# Patient Record
Sex: Male | Born: 1965 | Race: Black or African American | Hispanic: No | Marital: Single | Smoking: Former smoker
Health system: Southern US, Community
[De-identification: ages and names within clinical notes are randomized; demographics above are authoritative.]

## PROBLEM LIST (undated history)

## (undated) DIAGNOSIS — R55 Syncope and collapse: Secondary | ICD-10-CM

## (undated) DIAGNOSIS — E119 Type 2 diabetes mellitus without complications: Secondary | ICD-10-CM

## (undated) DIAGNOSIS — N200 Calculus of kidney: Secondary | ICD-10-CM

## (undated) DIAGNOSIS — Z95810 Presence of automatic (implantable) cardiac defibrillator: Secondary | ICD-10-CM

## (undated) DIAGNOSIS — I428 Other cardiomyopathies: Secondary | ICD-10-CM

## (undated) DIAGNOSIS — I5022 Chronic systolic (congestive) heart failure: Secondary | ICD-10-CM

## (undated) DIAGNOSIS — I499 Cardiac arrhythmia, unspecified: Secondary | ICD-10-CM

## (undated) DIAGNOSIS — R011 Cardiac murmur, unspecified: Secondary | ICD-10-CM

## (undated) DIAGNOSIS — Z87442 Personal history of urinary calculi: Secondary | ICD-10-CM

## (undated) DIAGNOSIS — I10 Essential (primary) hypertension: Secondary | ICD-10-CM

## (undated) DIAGNOSIS — E669 Obesity, unspecified: Secondary | ICD-10-CM

## (undated) DIAGNOSIS — K219 Gastro-esophageal reflux disease without esophagitis: Secondary | ICD-10-CM

## (undated) DIAGNOSIS — J42 Unspecified chronic bronchitis: Secondary | ICD-10-CM

## (undated) DIAGNOSIS — S42302A Unspecified fracture of shaft of humerus, left arm, initial encounter for closed fracture: Secondary | ICD-10-CM

## (undated) DIAGNOSIS — E236 Other disorders of pituitary gland: Secondary | ICD-10-CM

## (undated) HISTORY — DX: Syncope and collapse: R55

## (undated) HISTORY — DX: Chronic systolic (congestive) heart failure: I50.22

## (undated) HISTORY — DX: Personal history of urinary calculi: Z87.442

## (undated) HISTORY — DX: Unspecified fracture of shaft of humerus, left arm, initial encounter for closed fracture: S42.302A

## (undated) HISTORY — DX: Gastro-esophageal reflux disease without esophagitis: K21.9

## (undated) HISTORY — DX: Other cardiomyopathies: I42.8

## (undated) HISTORY — DX: Other disorders of pituitary gland: E23.6

## (undated) HISTORY — PX: CHOLECYSTECTOMY: SHX55

## (undated) HISTORY — PX: URETEROSCOPY: SHX842

## (undated) HISTORY — DX: Cardiac arrhythmia, unspecified: I49.9

## (undated) HISTORY — DX: Obesity, unspecified: E66.9

---
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
## 1998-06-18 ENCOUNTER — Emergency Department (HOSPITAL_COMMUNITY): Admission: EM | Admit: 1998-06-18 | Discharge: 1998-06-18 | Payer: Self-pay | Admitting: Internal Medicine

## 1998-06-18 ENCOUNTER — Encounter: Payer: Self-pay | Admitting: Internal Medicine

## 1998-08-02 ENCOUNTER — Ambulatory Visit: Admission: RE | Admit: 1998-08-02 | Discharge: 1998-08-02 | Payer: Self-pay | Admitting: Orthopedic Surgery

## 1999-10-23 ENCOUNTER — Emergency Department (HOSPITAL_COMMUNITY): Admission: EM | Admit: 1999-10-23 | Discharge: 1999-10-23 | Payer: Self-pay

## 1999-10-23 ENCOUNTER — Encounter: Payer: Self-pay | Admitting: Emergency Medicine

## 2001-03-09 ENCOUNTER — Encounter: Payer: Self-pay | Admitting: Emergency Medicine

## 2001-03-09 ENCOUNTER — Emergency Department (HOSPITAL_COMMUNITY): Admission: EM | Admit: 2001-03-09 | Discharge: 2001-03-09 | Payer: Self-pay | Admitting: Emergency Medicine

## 2002-04-07 ENCOUNTER — Emergency Department (HOSPITAL_COMMUNITY): Admission: EM | Admit: 2002-04-07 | Discharge: 2002-04-07 | Payer: Self-pay | Admitting: Emergency Medicine

## 2003-02-01 ENCOUNTER — Emergency Department (HOSPITAL_COMMUNITY): Admission: EM | Admit: 2003-02-01 | Discharge: 2003-02-01 | Payer: Self-pay | Admitting: Emergency Medicine

## 2003-02-01 ENCOUNTER — Encounter: Payer: Self-pay | Admitting: Emergency Medicine

## 2003-07-05 ENCOUNTER — Emergency Department (HOSPITAL_COMMUNITY): Admission: EM | Admit: 2003-07-05 | Discharge: 2003-07-05 | Payer: Self-pay | Admitting: Emergency Medicine

## 2003-10-03 ENCOUNTER — Emergency Department (HOSPITAL_COMMUNITY): Admission: EM | Admit: 2003-10-03 | Discharge: 2003-10-03 | Payer: Self-pay | Admitting: Emergency Medicine

## 2004-05-08 ENCOUNTER — Emergency Department (HOSPITAL_COMMUNITY): Admission: EM | Admit: 2004-05-08 | Discharge: 2004-05-08 | Payer: Self-pay | Admitting: Emergency Medicine

## 2004-06-09 DIAGNOSIS — E119 Type 2 diabetes mellitus without complications: Secondary | ICD-10-CM

## 2004-06-09 HISTORY — DX: Type 2 diabetes mellitus without complications: E11.9

## 2004-07-02 ENCOUNTER — Encounter (INDEPENDENT_AMBULATORY_CARE_PROVIDER_SITE_OTHER): Payer: Self-pay | Admitting: *Deleted

## 2004-07-02 ENCOUNTER — Inpatient Hospital Stay (HOSPITAL_COMMUNITY): Admission: EM | Admit: 2004-07-02 | Discharge: 2004-07-03 | Payer: Self-pay | Admitting: Emergency Medicine

## 2004-08-08 ENCOUNTER — Ambulatory Visit (HOSPITAL_COMMUNITY): Admission: RE | Admit: 2004-08-08 | Discharge: 2004-08-08 | Payer: Self-pay | Admitting: General Surgery

## 2004-12-05 ENCOUNTER — Ambulatory Visit: Payer: Self-pay | Admitting: Pulmonary Disease

## 2005-02-14 ENCOUNTER — Ambulatory Visit: Payer: Self-pay | Admitting: Pulmonary Disease

## 2005-06-05 ENCOUNTER — Ambulatory Visit: Payer: Self-pay | Admitting: Pulmonary Disease

## 2005-08-23 ENCOUNTER — Emergency Department (HOSPITAL_COMMUNITY): Admission: EM | Admit: 2005-08-23 | Discharge: 2005-08-23 | Payer: Self-pay | Admitting: Emergency Medicine

## 2005-10-09 ENCOUNTER — Emergency Department (HOSPITAL_COMMUNITY): Admission: EM | Admit: 2005-10-09 | Discharge: 2005-10-09 | Payer: Self-pay | Admitting: Emergency Medicine

## 2005-12-26 ENCOUNTER — Ambulatory Visit: Payer: Self-pay | Admitting: Pulmonary Disease

## 2006-03-30 ENCOUNTER — Ambulatory Visit: Payer: Self-pay | Admitting: Pulmonary Disease

## 2006-04-02 ENCOUNTER — Ambulatory Visit: Payer: Self-pay | Admitting: Pulmonary Disease

## 2006-04-17 ENCOUNTER — Ambulatory Visit: Payer: Self-pay | Admitting: Pulmonary Disease

## 2006-07-09 ENCOUNTER — Ambulatory Visit: Payer: Self-pay | Admitting: Pulmonary Disease

## 2006-07-09 LAB — CONVERTED CEMR LAB
BUN: 10 mg/dL (ref 6–23)
CO2: 25 meq/L (ref 19–32)
Calcium: 9.3 mg/dL (ref 8.4–10.5)
Chloride: 105 meq/L (ref 96–112)
Creatinine, Ser: 0.9 mg/dL (ref 0.4–1.5)
GFR calc Af Amer: 120 mL/min
GFR calc non Af Amer: 99 mL/min
Glucose, Bld: 89 mg/dL (ref 70–99)
Hgb A1c MFr Bld: 6.5 % — ABNORMAL HIGH (ref 4.6–6.0)
Potassium: 3.2 meq/L — ABNORMAL LOW (ref 3.5–5.1)
Sodium: 140 meq/L (ref 135–145)

## 2008-02-28 ENCOUNTER — Ambulatory Visit: Payer: Self-pay | Admitting: Pulmonary Disease

## 2008-02-28 DIAGNOSIS — R0789 Other chest pain: Secondary | ICD-10-CM | POA: Insufficient documentation

## 2008-02-28 DIAGNOSIS — K219 Gastro-esophageal reflux disease without esophagitis: Secondary | ICD-10-CM

## 2008-02-28 DIAGNOSIS — F411 Generalized anxiety disorder: Secondary | ICD-10-CM | POA: Insufficient documentation

## 2008-02-28 DIAGNOSIS — J209 Acute bronchitis, unspecified: Secondary | ICD-10-CM

## 2008-02-28 DIAGNOSIS — K589 Irritable bowel syndrome without diarrhea: Secondary | ICD-10-CM | POA: Insufficient documentation

## 2008-08-06 ENCOUNTER — Emergency Department (HOSPITAL_COMMUNITY): Admission: EM | Admit: 2008-08-06 | Discharge: 2008-08-06 | Payer: Self-pay | Admitting: Emergency Medicine

## 2008-11-07 ENCOUNTER — Emergency Department (HOSPITAL_COMMUNITY): Admission: EM | Admit: 2008-11-07 | Discharge: 2008-11-07 | Payer: Self-pay | Admitting: Emergency Medicine

## 2009-02-24 ENCOUNTER — Emergency Department (HOSPITAL_COMMUNITY): Admission: EM | Admit: 2009-02-24 | Discharge: 2009-02-24 | Payer: Self-pay | Admitting: Emergency Medicine

## 2009-05-08 ENCOUNTER — Ambulatory Visit: Payer: Self-pay | Admitting: Pulmonary Disease

## 2009-05-08 DIAGNOSIS — E1165 Type 2 diabetes mellitus with hyperglycemia: Secondary | ICD-10-CM

## 2009-05-08 DIAGNOSIS — R490 Dysphonia: Secondary | ICD-10-CM | POA: Insufficient documentation

## 2009-05-08 DIAGNOSIS — E669 Obesity, unspecified: Secondary | ICD-10-CM

## 2009-05-08 DIAGNOSIS — Z87891 Personal history of nicotine dependence: Secondary | ICD-10-CM | POA: Insufficient documentation

## 2009-05-08 DIAGNOSIS — IMO0002 Reserved for concepts with insufficient information to code with codable children: Secondary | ICD-10-CM | POA: Insufficient documentation

## 2009-05-13 DIAGNOSIS — E236 Other disorders of pituitary gland: Secondary | ICD-10-CM

## 2009-05-13 DIAGNOSIS — N200 Calculus of kidney: Secondary | ICD-10-CM | POA: Insufficient documentation

## 2009-05-13 LAB — CONVERTED CEMR LAB
ALT: 25 units/L (ref 0–53)
AST: 20 units/L (ref 0–37)
Albumin: 4.4 g/dL (ref 3.5–5.2)
Alkaline Phosphatase: 55 units/L (ref 39–117)
BUN: 10 mg/dL (ref 6–23)
Basophils Absolute: 0 10*3/uL (ref 0.0–0.1)
Basophils Relative: 0.7 % (ref 0.0–3.0)
Bilirubin, Direct: 0.1 mg/dL (ref 0.0–0.3)
CO2: 28 meq/L (ref 19–32)
Calcium: 9.1 mg/dL (ref 8.4–10.5)
Chloride: 108 meq/L (ref 96–112)
Cholesterol: 147 mg/dL (ref 0–200)
Creatinine, Ser: 0.8 mg/dL (ref 0.4–1.5)
Eosinophils Absolute: 0.2 10*3/uL (ref 0.0–0.7)
Eosinophils Relative: 3 % (ref 0.0–5.0)
GFR calc non Af Amer: 134.71 mL/min (ref >60)
Glucose, Bld: 142 mg/dL — ABNORMAL HIGH (ref 70–99)
HCT: 44.7 % (ref 39.0–52.0)
HDL: 35.5 mg/dL — ABNORMAL LOW (ref >39.00)
Hemoglobin: 15.3 g/dL (ref 13.0–17.0)
Hgb A1c MFr Bld: 7.5 % — ABNORMAL HIGH (ref 4.6–6.5)
LDL Cholesterol: 85 mg/dL (ref 0–99)
Lymphocytes Relative: 35.4 % (ref 12.0–46.0)
Lymphs Abs: 2.2 10*3/uL (ref 0.7–4.0)
MCHC: 34.3 g/dL (ref 30.0–36.0)
MCV: 93.6 fL (ref 78.0–100.0)
Monocytes Absolute: 0.5 10*3/uL (ref 0.1–1.0)
Monocytes Relative: 8 % (ref 3.0–12.0)
Neutro Abs: 3.2 10*3/uL (ref 1.4–7.7)
Neutrophils Relative %: 52.9 % (ref 43.0–77.0)
Platelets: 230 10*3/uL (ref 150.0–400.0)
Potassium: 3.9 meq/L (ref 3.5–5.1)
RBC: 4.78 M/uL (ref 4.22–5.81)
RDW: 13.1 % (ref 11.5–14.6)
Sodium: 143 meq/L (ref 135–145)
TSH: 0.68 microintl units/mL (ref 0.35–5.50)
Total Bilirubin: 0.8 mg/dL (ref 0.3–1.2)
Total CHOL/HDL Ratio: 4
Total Protein: 7.6 g/dL (ref 6.0–8.3)
Triglycerides: 135 mg/dL (ref 0.0–149.0)
VLDL: 27 mg/dL (ref 0.0–40.0)
WBC: 6.1 10*3/uL (ref 4.5–10.5)

## 2009-11-07 ENCOUNTER — Telehealth: Payer: Self-pay | Admitting: Pulmonary Disease

## 2010-07-11 NOTE — Progress Notes (Signed)
Summary: nos appt  Phone Note Call from Patient   Caller: [email or web address]  Call For: nadel Summary of Call: Pt will call to rsc nos from 5/31. Initial call taken by: Netta Neat,  November 07, 2009 2:22 PM

## 2010-08-22 ENCOUNTER — Other Ambulatory Visit: Payer: Self-pay

## 2010-08-22 ENCOUNTER — Other Ambulatory Visit: Payer: Self-pay | Admitting: Adult Health

## 2010-08-22 ENCOUNTER — Ambulatory Visit (INDEPENDENT_AMBULATORY_CARE_PROVIDER_SITE_OTHER): Payer: Self-pay | Admitting: Adult Health

## 2010-08-22 ENCOUNTER — Encounter: Payer: Self-pay | Admitting: Adult Health

## 2010-08-22 DIAGNOSIS — J209 Acute bronchitis, unspecified: Secondary | ICD-10-CM

## 2010-08-22 DIAGNOSIS — R7309 Other abnormal glucose: Secondary | ICD-10-CM

## 2010-08-22 LAB — BASIC METABOLIC PANEL
BUN: 8 mg/dL (ref 6–23)
Chloride: 105 mEq/L (ref 96–112)
Creatinine, Ser: 0.8 mg/dL (ref 0.4–1.5)
GFR: 139.98 mL/min (ref >60.00)
Glucose, Bld: 189 mg/dL — ABNORMAL HIGH (ref 70–99)
Potassium: 4.1 mEq/L (ref 3.5–5.1)
Sodium: 139 mEq/L (ref 135–145)

## 2010-08-22 LAB — HEMOGLOBIN A1C: Hgb A1c MFr Bld: 9.7 % — ABNORMAL HIGH (ref 4.6–6.5)

## 2010-09-05 NOTE — Assessment & Plan Note (Signed)
Summary: Acute NP office visit - asthmatic bronchitis   CC:  Pt c/o nasal congestion, headache, teeth hurt, facial pressure, tired, and sneezing x 3 days. Tried Tylenol cold w/o relief. Sore throat today. Pt also request refill on Metformin and Dexilant.  History of Present Illness:   The patient is a 45 year old African-American male, patient of Dr. Jeannine Kitten who continues to smoke against medical advice. Has hx of GERD and IBS.     August 22, 2010 --Presents for an acute office visit. Complains of  nasal congestion, headache, teeth hurt, facial pressure, tired, sneezing x 3 days. Tried Tylenol cold w/o relief. Sore throat today. Pt also request refill on Metformin and Dexilant. He was last seen 04/2009. Overall says he has been doing weel. He is still smoking, not ready to quit. He does not have insurance. Denies chest pain, dyspnea, orthopnea, hemoptysis, fever, n/v/d, edema, headache, polyuria, polydipsia.    Preventive Screening-Counseling & Management  Alcohol-Tobacco     Smoking Status: current     Packs/Day: 1ppd  Medications Prior to Update: 1)  Metformin Hcl 500 Mg Tabs (Metformin Hcl) .... Take 1 Tab By Mouth Two Times A Day At [place]... 2)  Dexilant 60 Mg Cpdr (Dexlansoprazole) .... Take 1 Tab By Mouth Once Daily- 30 Min Before The Evening Meal...  Current Medications (verified): 1)  Metformin Hcl 500 Mg Tabs (Metformin Hcl) .... Take 1 Tab By Mouth Two Times A Day At [place]... 2)  Dexilant 60 Mg Cpdr (Dexlansoprazole) .... Take 1 Tab By Mouth Once Daily- 30 Min Before The Evening Meal...  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 Mother alive, age 60, hx DM, arthritis 3 Siblings- 1 Bro w/ HBP  Social History: Last updated: 05/08/2009 Single No children +smoker- 1ppd  Social alcohol No drugs Not exercising  Risk Factors: Smoking Status: current (08/22/2010) Packs/Day: 1ppd (08/22/2010)  Review of Systems      See HPI  Vital Signs:  Patient profile:   45 year old male Height:      73 inches Weight:      272.8 pounds BMI:     36.12 O2 Sat:      95 % on Room air Temp:     97.3 degrees F oral Pulse rate:   91 / minute BP sitting:   108 / 70  (left arm) Cuff size:   large  Vitals Entered By: Iran Planas CMA (August 22, 2010 12:00 PM)  O2 Flow:  Room air CC: Pt c/o nasal congestion, headache, teeth hurt, facial pressure, tired, sneezing x 3 days. Tried Tylenol cold w/o relief. Sore throat today. Pt also request refill on Metformin and Dexilant Is Patient Diabetic? Yes Comments Medications reviewed with patient Verified contact number and pharmacy with patient Iran Planas CMA  August 22, 2010 12:03 PM    Physical Exam  Additional Exam:  WD, Overweight, 44y/o BM in NAD... GENERAL:  Alert & oriented; pleasant & cooperative... HEENT:  [place]/AT, EOM-wnl,  , EACs-clear, TMs-wnl, NOSE-clear drainge, max sinus tenderness ,  THROAT-clear & wnl. NECK:  Supple w/ full ROM; no JVD; normal carotid impulses w/o bruits; no thyromegaly or nodules palpated; no lymphadenopathy. CHEST:  Clear to P & A; without wheezes/ rales/ or rhonchi. HEART:  Regular Rhythm; without murmurs/ rubs/ or gallops. ABDOMEN:  Soft & nontender; normal bowel sounds; no organomegaly or masses detected. EXT: without deformities or  arthritic changes; no varicose veins/ venous insuffic/ or edema.     Impression & Recommendations:  Problem # 1:  Hx of ASTHMATIC BRONCHITIS, ACUTE (ICD-466.0) Flare :  Amoxicillin 500mg  three times a day for 10 days  Mucinex two times a day as needed congestion Saline nasal rinses as needed  Afrin 2 puffs two times a day x 3 days  Nasonex 2 puffs two times a day until sample is gone.  Please contact office for sooner follow up if symptoms do not improve or worsen  Orders: Admin of Therapeutic Inj  intramuscular or subcutaneous [id number]) Depo- Medrol 80mg  (J1040) Depo- Medrol 40mg  (J1030) Est. Patient Level II [id number])  His updated medication list for this problem includes:    Amoxicillin 500 Mg Caps (Amoxicillin) .Marland Kitchen... Take 1 capsule by mouth three times a day x10days  Problem # 2:  DIABETES MELLITUS, BORDERLINE (ICD-790.29) dfiet discussed  labs pending no change in rx , follow up labs.  Orders: TLB-BMP (Basic Metabolic Panel-BMET) (99991111) TLB-A1C / Hgb A1C (Glycohemoglobin) (83036-A1C) Est. Patient Level II [id number])  Medications Added to Medication List This Visit: 1)  Amoxicillin 500 Mg Caps (Amoxicillin) .... Take 1 capsule by mouth three times a day x10days  Patient Instructions: 1)  Amoxicillin 500mg  three times a day for 10 days  2)  Mucinex two times a day as needed congestion 3)  Saline nasal rinses as needed  4)  Afrin 2 puffs two times a day x 3 days  5)  Nasonex 2 puffs two times a day until sample is gone.  6)  Please contact office for sooner follow up if symptoms do not improve or worsen  Prescriptions: AMOXICILLIN 500 MG CAPS (AMOXICILLIN) Take 1 capsule by mouth three times a day x10days  #30 x 0   Entered by:   Parke Poisson CNA/MA   Authorized by:   Rexene Edison NP   Signed by:   Parke Poisson CNA/MA on 08/22/2010   Method used:   Electronically to        [facility] [phone]* (retail)       [place], [place]  [id number]       Ph: [phone] or [phone]       Fax: [phone]   RxID:   [id number] DEXILANT 60 MG CPDR (DEXLANSOPRAZOLE) take 1 tab by mouth once daily- 30 min before the evening meal...  #30 x 5   Entered and Authorized by:   Rexene Edison NP   Signed by:   Rexene Edison NP on 08/22/2010   Method used:   Electronically to        [facility] [phone]* (retail)       [place], [place]  [id number]       Ph: [phone] or [phone]       Fax: [phone]   RxID:   [id number] METFORMIN HCL 500 MG TABS (METFORMIN HCL) take 1 tab  by mouth two times a day at breakfast & dinner...  #60 x 5   Entered and Authorized by:   Rexene Edison NP   Signed by:   Hazelene Doten NP on 08/22/2010   Method used:   Electronically to        [facility] [phone]* (retail)       [place], [place]  [id number]       Ph: [phone] or [phone]       Fax: [phone]   RxID:   [id number]    Medication Administration  Injection # 1:    Medication: Depo- Medrol 40mg     Diagnosis: Hx of ASTHMATIC BRONCHITIS, ACUTE (ICD-466.0)    Route: IM    Site: RUOQ gluteus    Exp Date: 12-2012    Lot #: [id number]    Mfr: Pharmacia    Patient tolerated injection without complications    Given by: Parke Poisson CNA/MA (August 22, 2010 12:32 PM)  Injection # 2:    Medication: Depo- Medrol 80mg     Diagnosis: Hx of ASTHMATIC BRONCHITIS, ACUTE (ICD-466.0)    Route: IM    Site: RUOQ gluteus    Exp Date: 12-2012    Lot #: [id number]    Mfr: Pharmacia    Patient tolerated injection without complications    Given by: Parke Poisson CNA/MA (August 22, 2010 12:32 PM)  Orders Added: 1)  Admin of Therapeutic Inj  intramuscular or subcutaneous [96372] 2)  Depo- Medrol 80mg  [J1040] 3)  Depo- Medrol 40mg  [J1030] 4)  TLB-BMP (Basic Metabolic Panel-BMET) [id number] 5)   TLB-A1C / Hgb A1C (Glycohemoglobin) [83036-A1C] 6)  Est. Patient Level II [id number]

## 2010-09-13 LAB — GLUCOSE, CAPILLARY
Glucose-Capillary: 106 mg/dL — ABNORMAL HIGH (ref 70–99)
Glucose-Capillary: 108 mg/dL — ABNORMAL HIGH (ref 70–99)

## 2010-09-16 LAB — URINALYSIS, ROUTINE W REFLEX MICROSCOPIC
Bilirubin Urine: NEGATIVE
Glucose, UA: NEGATIVE mg/dL
Hgb urine dipstick: NEGATIVE
Nitrite: NEGATIVE
Protein, ur: NEGATIVE mg/dL
Specific Gravity, Urine: 1.022 (ref 1.005–1.030)
Urobilinogen, UA: 1 mg/dL (ref 0.0–1.0)
pH: 6 (ref 5.0–8.0)

## 2010-09-16 LAB — POCT I-STAT, CHEM 8
BUN: 10 mg/dL (ref 6–23)
Calcium, Ion: 1.15 mmol/L (ref 1.12–1.32)
Chloride: 105 mEq/L (ref 96–112)
Creatinine, Ser: 0.8 mg/dL (ref 0.4–1.5)
Glucose, Bld: 131 mg/dL — ABNORMAL HIGH (ref 70–99)
HCT: 45 % (ref 39.0–52.0)
Hemoglobin: 15.3 g/dL (ref 13.0–17.0)
Potassium: 3.7 mEq/L (ref 3.5–5.1)
Sodium: 140 mEq/L (ref 135–145)
TCO2: 26 mmol/L (ref 0–100)

## 2010-09-16 LAB — CBC
HCT: 43.3 % (ref 39.0–52.0)
Hemoglobin: 14.7 g/dL (ref 13.0–17.0)
MCHC: 34 g/dL (ref 30.0–36.0)
MCV: 92.2 fL (ref 78.0–100.0)
Platelets: 225 10*3/uL (ref 150–400)
RBC: 4.69 MIL/uL (ref 4.22–5.81)
RDW: 14.2 % (ref 11.5–15.5)
WBC: 7 10*3/uL (ref 4.0–10.5)

## 2010-09-16 LAB — DIFFERENTIAL
Basophils Absolute: 0 10*3/uL (ref 0.0–0.1)
Basophils Relative: 1 % (ref 0–1)
Eosinophils Absolute: 0.3 10*3/uL (ref 0.0–0.7)
Eosinophils Relative: 4 % (ref 0–5)
Lymphocytes Relative: 36 % (ref 12–46)
Lymphs Abs: 2.5 10*3/uL (ref 0.7–4.0)
Monocytes Absolute: 0.6 10*3/uL (ref 0.1–1.0)
Monocytes Relative: 8 % (ref 3–12)
Neutro Abs: 3.6 10*3/uL (ref 1.7–7.7)
Neutrophils Relative %: 52 % (ref 43–77)

## 2010-09-16 LAB — POCT CARDIAC MARKERS
CKMB, poc: <1 ng/mL — ABNORMAL LOW (ref 1.0–8.0)
CKMB, poc: <1 ng/mL — ABNORMAL LOW (ref 1.0–8.0)
Myoglobin, poc: 71.8 ng/mL (ref 12–200)
Troponin i, poc: <0.05 ng/mL (ref 0.00–0.09)

## 2010-09-16 LAB — GLUCOSE, CAPILLARY: Glucose-Capillary: 130 mg/dL — ABNORMAL HIGH (ref 70–99)

## 2010-09-24 LAB — POCT I-STAT, CHEM 8
BUN: 10 mg/dL (ref 6–23)
Creatinine, Ser: 0.9 mg/dL (ref 0.4–1.5)
Potassium: 3.6 mEq/L (ref 3.5–5.1)
Sodium: 142 mEq/L (ref 135–145)

## 2010-09-24 LAB — URINALYSIS, ROUTINE W REFLEX MICROSCOPIC
Bilirubin Urine: NEGATIVE
Hgb urine dipstick: NEGATIVE
Ketones, ur: NEGATIVE mg/dL
Specific Gravity, Urine: 1.028 (ref 1.005–1.030)
Urobilinogen, UA: 1 mg/dL (ref 0.0–1.0)

## 2010-10-25 NOTE — Op Note (Signed)
[name], [name]                ACCOUNT NO.:  [id number]   MEDICAL RECORD NO.:  [id number]          PATIENT TYPE:  INP   LOCATION:  [id number]                         FACILITY:  [facility]   PHYSICIAN:  Orson Ape. Weatherly, M.D.DATE OF BIRTH:  [birth year]   DATE OF PROCEDURE:  07/02/2004  DATE OF DISCHARGE:                                 OPERATIVE REPORT   PREOPERATIVE DIAGNOSIS:  Acute cholecystitis with stones.   POSTOPERATIVE DIAGNOSIS:  Acute cholecystitis with stones.   OPERATION/PROCEDURE:  Laparoscopic cholecystectomy with cholangiogram.   ANESTHESIA:  General.   SURGEON:  Orson Ape. Rise Patience, M.D.   ASSISTANT:  Shellia Carwin, M.D.   HISTORY:  [name] is a 45 year old, about 235-pound male who has had  three significant episodes of severe epigastric, right upper quadrant pain  and after the third episode, this has been occurring at about 10-day  intervals, presented to the emergency room at [facility] where a CT scan  showed a thickened gallbladder and normal common bile duct.  He underwent an  ultrasound then that showed thickened, acutely inflamed gallbladder.  His  white count was only 7600 and he was admitted by Dr. Truitt Leep about  midnight last night.  Today because of scheduling conflicts, she asked that  I manage him and he has been waiting all day, and finally able to get to the  OR about 7:30.   DESCRIPTION OF PROCEDURE:  Preoperatively the patient has been given Unasyn.  He has PAS stockings and is positioned on the OR table.  Induction of  general anesthesia, endotracheal tube, oral tube into the stomach.  The area  around the navel, the subxiphoid and the right lateral area were shaved and  then prepped with Betadine surgical solution and draped in a sterile manner.  A small incision below the umbilicus was made through two inches of adipose  tissue.  The fascia picked up between two Kochers carefully and then a small  opening carefully made into the  peritoneal cavity.  A pursestring suture was  made under direct vision and the Hasson cannula introduced.  The upper 10 mm  trocar was placed under direct vision after anesthetizing the fascia with  Marcaine and  the two lateral 5 mm trocars were positioned by Dr. Magdalene River.  The gallbladder was markedly inflamed and dilated and we could grasp  and kind of flip the liver upwards.  Fortunately he did not have any  adhesions in the right upper quadrant except for a few right around the  gallbladder area.  He was positioned so he turned somewhat to the left and  then using the hook electrodissector, kind of opened up the peritoneum at  the most proximal portion of the gallbladder, identifying the cystic duct.  This was encompassed and then clipped flush with an endoclip, a little  opening made proximally in the cystic duct. Cook catheter introduced and  held in place with clips and x-ray was obtained which showed good flow into  the extrahepatic biliary tree and the intrahepatic radicals also visualized.  No evidence of any common duct  stones.  The catheter was removed.  The  cystic duct was triply clipped and then divided.  Next, the cystic artery  was identified, doubly clipped proximally, singly distally and then  additional clip placed on the real free cystic artery and then it was  clipped distal to the three clips and then the __________  used to free up  the gallbladder carefully.  Good hemostasis was obtained.  We then placed  the gallbladder within an EndoCatch bag.  We switched the camera to the  upper 10 mm port and withdrew the opening of the bag through the umbilicus.  I could tease up the proximal portion of the gallbladder and then opened it  and we crushed a large amount of bilirubin and cholesterol stones and then  were able to kind of bring it through the fascial defect by enlarging it.  The reinspection revealed good hemostasis.  The umbilical fascia was closed  with  figure-of-eight in addition to the pursestring.  Both tied with 0  Vicryl and then the fascia  anesthetized.  The little irrigating fluid that  had been used was aspirated and good hemostasis and then the carbon dioxide  released.  The 5 mm ports were removed under direct vision and then the  carbon dioxide released.  The upper 10 mm trocar was withdrawn and the  subcutaneous wounds were closed with 4-0 Vicryl and Benzoin and Steri-Strips  were placed on the skin.  The patient tolerated the procedure nicely and was  extubated and taken to the recovery room in stable postoperative condition.   Hopefully, he will be ready for discharge in the morning. I will give him  one additional dose of Unasyn.      WJW/MEDQ  D:  07/02/2004  T:  07/03/2004  Job:  [id number]

## 2010-10-25 NOTE — H&P (Signed)
NAMEMERIL, PELINO                ACCOUNT NO.:  [id number]   MEDICAL RECORD NO.:  [id number]          PATIENT TYPE:  INP   LOCATION:  [id number]                         FACILITY:  [facility]   PHYSICIAN:  Darrelyn Hillock, MDDATE OF BIRTH:  [birth year]   DATE OF ADMISSION:  07/02/2004  DATE OF DISCHARGE:                                HISTORY & PHYSICAL   ADMISSION DIAGNOSIS:  Acute cholecystitis.   CONDITION ON ADMISSION:  Serious.   ADMITTING PHYSICIAN:  Janeece Agee, M.D.   HOPI:  The patient is a very pleasant 45 year old black male who over the  last four months has been having episodes at least every 10 days of right  upper quadrant and epigastric abdominal pain.  Last night it lasted more  than 24 hours and he presented to the [facility] Room.  Workup  there showed a CT with a thickened gallbladder wall, normal common bile  ducts.  The patient then underwent ultrasound which verified the thick  gallbladder wall and the evidence of cholelithiasis.  Patient had normal  common bile duct diameter.  Liver function tests are all within normal  range, his white count is 7.6 thousand.   PAST MEDICAL HISTORY:  None.   PAST SURGICAL HISTORY:  None.   MEDICATIONS:  None.   ALLERGIES:  He has NO KNOWN DRUG ALLERGIES.   PHYSICAL EXAMINATION:  He is an appropriate black male in no distress.  He  temperature is 97, heart rate is 70, respiratory rate is 18, blood pressure  is 135/86.  HEENT EXAM:  Benign, normocephalic, atraumatic.  Pupils are equal, round,  reactive to light.  NECK:  Supple and soft without thyromegaly or cervical adenopathy.  LUNGS:  Clear to auscultation and percussion x2.  HEART:  Regular rate and rhythm without murmurs, rubs or gallops.  ABDOMEN:  Soft, moderately tender in the right upper quadrant.  To  palpation, there was no evidence of abdominal wall hernia defect.  EXTREMITY EXAM:  Shows no clubbing, cyanosis or edema.   IMPRESSION:  Acute  cholecystitis.   PLAN:  Admission, IV antibiotics and laparoscopic cholecystectomy.      KRH/MEDQ  D:  07/02/2004  T:  07/02/2004  Job:  [id number]

## 2010-10-25 NOTE — Assessment & Plan Note (Signed)
[place]                               PULMONARY OFFICE NOTE   [name]                       MRN:          [id number]  DATE:03/30/2006                            DOB:          1965/06/30    HISTORY OF PRESENT ILLNESS:  The patient is a 45 year old African-American  male, patient of [name] who continues to smoke against medical advice.  The patient presents complaining that he has been extremely fatigued over  the last several months and has noted some polyuria and polydipsia.  The  patient denies any chest pain, nausea, vomiting, leg swelling, rash or  diarrhea.  The patient also has had persistent hoarseness over the last 4  months and reports he had an [name] evaluation 3 weeks ago, which sounds like  he did in fact have a biopsy.  The reports were unremarkable.  Those records  are unavailable and the patient does not remember the name of the [name] he was  seen by.  We did not make this referral.   PAST MEDICAL HISTORY:  1. Gastroesophageal reflux.  2. Asthmatic bronchitis.  3. Status post cholecystectomy.  4. Irritable bowel syndrome.   CURRENT MEDICATIONS:  Nexium 40 mg daily.   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  He smokes approximately 1 pack per day and drinks  alcohol on a social basis.   PHYSICAL EXAMINATION:  GENERAL:  The patient is a morbidly obese male in no  acute distress.  VITAL SIGNS:  He is afebrile.  Blood pressure is 150/90.  O2 saturation is  96% on room air.  Weight is at 286.  HEENT:  Nasal mucosa is erythematous.  Posterior pharynx has some erythema  and no exudate noted.  TMs are normal.  NECK:  Supple without cervical adenopathy.  No JVD.  LUNGS:  Lung sounds are clear.  CARDIAC:  S1 and S2 without murmur, rub or gallop.  ABDOMEN:  Morbidly obese and soft.  No hepatosplenomegaly.  EXTREMITIES:  Warm without any calf tenderness,  cyanosis, clubbing or edema.   IMPRESSION AND PLAN:  1. Acute rhinitis and persistent hoarseness.  A strep test and mono test      are currently pending.  Will add in Nasacort AQ 2 puffs twice daily      along with Claritin daily.  The patient will check here in 4 weeks with      Dr. Lenna Gilford or sooner if needed.  2. Polyuria, polydipsia, weight gain and fatigue.  Will need labs done      today.  Patient does have a family history of diabetes mellitus.  Will      check hemoglobin A1c and CMET.  Patient      is advised on dietary measures and exercise.  Will follow up on labs      accordingly.  3. Gastroesophageal reflux.  Will continue on Nexium.      ______________________________  Rexene Edison, NP    ______________________________  Deborra Medina. Lenna Gilford, MD  TP/MedQ  DD:  03/30/2006  DT:  03/31/2006  Job #:  [id number]

## 2010-12-25 ENCOUNTER — Inpatient Hospital Stay (INDEPENDENT_AMBULATORY_CARE_PROVIDER_SITE_OTHER)
Admission: RE | Admit: 2010-12-25 | Discharge: 2010-12-25 | Disposition: A | Discharge status: Home / Self Care | Payer: Self-pay | Source: Ambulatory Visit | Attending: Family Medicine | Admitting: Family Medicine

## 2010-12-25 DIAGNOSIS — N419 Inflammatory disease of prostate, unspecified: Secondary | ICD-10-CM

## 2010-12-25 LAB — POCT URINALYSIS DIP (DEVICE)
Bilirubin Urine: NEGATIVE
Hgb urine dipstick: NEGATIVE
Ketones, ur: 15 mg/dL — AB
pH: 5.5 (ref 5.0–8.0)

## 2011-01-03 ENCOUNTER — Ambulatory Visit (INDEPENDENT_AMBULATORY_CARE_PROVIDER_SITE_OTHER): Payer: Self-pay

## 2011-01-03 ENCOUNTER — Inpatient Hospital Stay (INDEPENDENT_AMBULATORY_CARE_PROVIDER_SITE_OTHER)
Admission: RE | Admit: 2011-01-03 | Discharge: 2011-01-03 | Disposition: A | Discharge status: Home / Self Care | Payer: Self-pay | Source: Ambulatory Visit | Attending: Family Medicine | Admitting: Family Medicine

## 2011-01-03 DIAGNOSIS — J019 Acute sinusitis, unspecified: Secondary | ICD-10-CM

## 2011-01-03 DIAGNOSIS — J40 Bronchitis, not specified as acute or chronic: Secondary | ICD-10-CM

## 2011-09-04 ENCOUNTER — Telehealth: Payer: Self-pay | Admitting: Pulmonary Disease

## 2011-09-04 MED ORDER — AMOXICILLIN-POT CLAVULANATE 875-125 MG PO TABS: 1.0000 | ORAL_TABLET | Freq: Two times a day (BID) | ORAL | Status: AC

## 2011-09-04 NOTE — Telephone Encounter (Signed)
Pt aware of SN recs and was told to keep his appt in May with Sn. Pt verbalized understanding.

## 2011-09-04 NOTE — Telephone Encounter (Signed)
Pt c/o sinus pain and pressure, cough with brownish mucus and thinks he may have had a fever yesterday. Pt last saw SN in 2010 but did she TP in 2012. Pt has been scheduled with SN for 10/10/2011. Pls advise.Allergies not on file Pt wants any prescription to be sent to [facility] on [address] not [facility].

## 2011-09-04 NOTE — Telephone Encounter (Signed)
Per SN---pt will need to keep appt in may---augmentin 875mg   #20  1 po bid until gone,. mucinex otc  2 po bid with plenty of fluids, nasal saline spray every  Hour while awake.  thanks

## 2011-10-09 ENCOUNTER — Encounter: Payer: Self-pay | Admitting: Pulmonary Disease

## 2011-10-10 ENCOUNTER — Ambulatory Visit: Payer: Self-pay | Admitting: Pulmonary Disease

## 2013-06-09 ENCOUNTER — Other Ambulatory Visit: Payer: Self-pay | Admitting: Family Medicine

## 2013-06-20 ENCOUNTER — Emergency Department (HOSPITAL_COMMUNITY)
Admission: EM | Admit: 2013-06-20 | Discharge: 2013-06-20 | Discharge status: Against Medical Advice | Payer: Self-pay | Attending: Emergency Medicine | Admitting: Emergency Medicine

## 2013-06-20 ENCOUNTER — Emergency Department (HOSPITAL_COMMUNITY): Payer: Self-pay

## 2013-06-20 ENCOUNTER — Encounter (HOSPITAL_COMMUNITY): Payer: Self-pay | Admitting: Emergency Medicine

## 2013-06-20 DIAGNOSIS — R110 Nausea: Secondary | ICD-10-CM | POA: Insufficient documentation

## 2013-06-20 DIAGNOSIS — R05 Cough: Secondary | ICD-10-CM | POA: Insufficient documentation

## 2013-06-20 DIAGNOSIS — R1032 Left lower quadrant pain: Secondary | ICD-10-CM | POA: Insufficient documentation

## 2013-06-20 DIAGNOSIS — F17200 Nicotine dependence, unspecified, uncomplicated: Secondary | ICD-10-CM | POA: Insufficient documentation

## 2013-06-20 DIAGNOSIS — E669 Obesity, unspecified: Secondary | ICD-10-CM | POA: Insufficient documentation

## 2013-06-20 DIAGNOSIS — J3489 Other specified disorders of nose and nasal sinuses: Secondary | ICD-10-CM | POA: Insufficient documentation

## 2013-06-20 DIAGNOSIS — Z8719 Personal history of other diseases of the digestive system: Secondary | ICD-10-CM | POA: Insufficient documentation

## 2013-06-20 DIAGNOSIS — M549 Dorsalgia, unspecified: Secondary | ICD-10-CM | POA: Insufficient documentation

## 2013-06-20 DIAGNOSIS — E119 Type 2 diabetes mellitus without complications: Secondary | ICD-10-CM | POA: Insufficient documentation

## 2013-06-20 DIAGNOSIS — Z8659 Personal history of other mental and behavioral disorders: Secondary | ICD-10-CM | POA: Insufficient documentation

## 2013-06-20 DIAGNOSIS — R300 Dysuria: Secondary | ICD-10-CM | POA: Insufficient documentation

## 2013-06-20 DIAGNOSIS — N200 Calculus of kidney: Secondary | ICD-10-CM | POA: Insufficient documentation

## 2013-06-20 DIAGNOSIS — R059 Cough, unspecified: Secondary | ICD-10-CM | POA: Insufficient documentation

## 2013-06-20 DIAGNOSIS — R109 Unspecified abdominal pain: Secondary | ICD-10-CM

## 2013-06-20 LAB — BASIC METABOLIC PANEL
BUN: 12 mg/dL (ref 6–23)
CALCIUM: 8.4 mg/dL (ref 8.4–10.5)
CO2: 24 meq/L (ref 19–32)
Chloride: 105 mEq/L (ref 96–112)
Creatinine, Ser: 0.61 mg/dL (ref 0.50–1.35)
GFR calc Af Amer: >90 mL/min (ref >90)
GLUCOSE: 254 mg/dL — AB (ref 70–99)
POTASSIUM: 4.1 meq/L (ref 3.7–5.3)
SODIUM: 140 meq/L (ref 137–147)

## 2013-06-20 LAB — CBC WITH DIFFERENTIAL/PLATELET
Basophils Absolute: 0 10*3/uL (ref 0.0–0.1)
Basophils Relative: 1 % (ref 0–1)
EOS ABS: 0.1 10*3/uL (ref 0.0–0.7)
EOS PCT: 3 % (ref 0–5)
HCT: 41 % (ref 39.0–52.0)
HEMOGLOBIN: 14.2 g/dL (ref 13.0–17.0)
LYMPHS ABS: 2.2 10*3/uL (ref 0.7–4.0)
LYMPHS PCT: 41 % (ref 12–46)
MCH: 30.8 pg (ref 26.0–34.0)
MCHC: 34.6 g/dL (ref 30.0–36.0)
MCV: 88.9 fL (ref 78.0–100.0)
MONOS PCT: 6 % (ref 3–12)
Monocytes Absolute: 0.3 10*3/uL (ref 0.1–1.0)
Neutro Abs: 2.7 10*3/uL (ref 1.7–7.7)
Neutrophils Relative %: 50 % (ref 43–77)
PLATELETS: 230 10*3/uL (ref 150–400)
RBC: 4.61 MIL/uL (ref 4.22–5.81)
RDW: 13.8 % (ref 11.5–15.5)
WBC: 5.5 10*3/uL (ref 4.0–10.5)

## 2013-06-20 LAB — URINE MICROSCOPIC-ADD ON

## 2013-06-20 LAB — URINALYSIS, ROUTINE W REFLEX MICROSCOPIC
Bilirubin Urine: NEGATIVE
Glucose, UA: >1000 mg/dL — AB
Hgb urine dipstick: NEGATIVE
Ketones, ur: NEGATIVE mg/dL
Leukocytes, UA: NEGATIVE
Nitrite: NEGATIVE
Protein, ur: NEGATIVE mg/dL
Specific Gravity, Urine: 1.028 (ref 1.005–1.030)
Urobilinogen, UA: 1 mg/dL (ref 0.0–1.0)
pH: 5.5 (ref 5.0–8.0)

## 2013-06-20 LAB — LIPASE, BLOOD: Lipase: 40 U/L (ref 11–59)

## 2013-06-20 MED ORDER — OXYCODONE-ACETAMINOPHEN 5-325 MG PO TABS: 1.0000 | ORAL_TABLET | Freq: Three times a day (TID) | ORAL | Status: DC | PRN

## 2013-06-20 MED ORDER — ONDANSETRON HCL 4 MG/2ML IJ SOLN
4.0000 mg | Freq: Once | INTRAMUSCULAR | Status: AC
  Administered 2013-06-20: 4 mg via INTRAVENOUS
  Filled 2013-06-20: qty 2

## 2013-06-20 MED ORDER — OXYCODONE-ACETAMINOPHEN 5-325 MG PO TABS
2.0000 | ORAL_TABLET | Freq: Once | ORAL | Status: DC
  Filled 2013-06-20: qty 2

## 2013-06-20 MED ORDER — KETOROLAC TROMETHAMINE 30 MG/ML IJ SOLN
30.0000 mg | Freq: Once | INTRAMUSCULAR | Status: AC
  Administered 2013-06-20: 30 mg via INTRAVENOUS
  Filled 2013-06-20: qty 1

## 2013-06-20 MED ORDER — ONDANSETRON HCL 4 MG PO TABS: 4.0000 mg | ORAL_TABLET | Freq: Four times a day (QID) | ORAL | Status: DC

## 2013-06-20 NOTE — Discharge Instructions (Signed)
Please call and set-up an appointment with urology regarding flank pain Please take medications as prescribed - while on pain medications there is to be no drinking alcohol, driving, operating heavy machinery. If there is extra please dispose in a proper manner. Please do not take any extra Tylenol for this can lead to liver failure and Tylenol overdose.  Please rest and stay hydrated Please continue to strain urine for passage of stone possibility Please continue to monitor symptoms and if symptoms are to worsen or change (fever, chills, abdominal pain, blood in urine, chest pain, shortness of breath, difficulty breathing, worsening or changes to flank pain, nausea, vomiting) please report back to the ED immediately.     Diet for Kidney Stones Kidney stones are small, hard masses that form inside your kidneys. They are made up of salts and minerals and often form when high levels build up in the urine. The minerals can then start to build up, crystalize, and stick together to form stones. There are several different types of kidney stones. The following types of stones may be influenced by dietary factors:   Calcium Oxalate Stones. An oxalate is a salt found in certain foods. Within the body, calcium can combine with oxalates to form calcium oxalate stones, which can be excreted in the urine in high amounts. This is the most common type of kidney stone.  Calcium Phosphate Stones. These stones may occur when the pH of the urine becomes too high, or less acidic, from too much calcium being excreted in the urine. The pH is a measure of how acidic or basic a substance is.  Uric Acid Stones. This type of stone occurs when the pH of the urine becomes too low, or very acidic, because substances called purines build up in the urine. Purines are found in animal proteins. When the urine is highly concentrated with acid, uric acid kidney stones can form.  Other risk factors for kidney stones include genetics,  environment, and being overweight. Your caregiver may ask you to follow specific diet guidelines based on the type of stone you have to lessen the chances of your body making more kidney stones.  GENERAL GUIDELINES FOR ALL TYPES OF STONES  Drink plenty of fluid. Drink 12 16 cups of fluid a day, drinking mainly water.This is the most important thing you can do to prevent the formation of future kidney stones.  Maintain a healthy weight. Your caregiver or dietitian can help you determine what a healthy weight is for you. If you are overweight, weight loss may help prevent the formation of future kidney stones.  Eat a diet adequate in animal protein. Too much animal protein can contribute to the formation of stones. Your dietitian can help you determine how much protein you should be eating. Avoid low carbohydrate, high protein diets.  Follow a balanced eating approach. The DASH diet, which stands for "Dietary Approaches to Stop Hypertension," is an effective meal plan for reducing stone formation. This diet is high in fruits, vegetables, dairy, and whole grains and low in animal protein. Ask your caregiver or dietitian for information about the DASH diet. ADDITIONAL DIET GUIDELINES FOR CALCIUM STONES Avoid foods high in salt. This includes table salt, salt seasonings, MSG, soy sauce, cured and processed meats, salted crackers and snack foods, fast food, and canned soups and foods. Ask your caregiver or dietitian for information about reducing sodium in your diet or following the low sodium diet.  Ensure adequate calcium intake. Use the following table  for calcium guidelines:  Men 31 years old and younger  1000 mg/day.  Men 82 years old and older  1500 mg/day.  Women 74 49 years old  1000 mg/day.  Women 50 years and older  1500 mg/day. Your dietitian can help you determine if you are getting enough calcium in your diet. Foods that are high in calcium include dairy products, broccoli, cheese,  yogurt, and pudding. If you need to take a calcium supplement, take it only in the form of calcium citrate.  Avoid foods high in oxalate. Be sure that any supplements you take do not contain more than 500 mg of vitamin C. Vitamin C is converted into oxalate in the body. You do not need to avoid fruits and vegetables high in vitamin C.   Grains: High-fiber or bran cereal, whole-wheat bread, grits, barley, buckwheat, amaranth, pretzels, and fruitcake.  Vegetables: Dried beans, wax beans, dark leafy greens, eggplant, leeks, okra, parsley, rutabaga, tomato paste, watercress, zucchini, and escarole.  Fruit: Dried apricots, red currants, figs, kiwi, and rhubarb.  Meat and Meat Substitutes: Soybeans and foods made from soy (soyburger, miso), dried beans, peanut butter.  Milk: Chocolate milk mixes and soymilk.  Fats and Oils: Nuts (peanuts, almonds, pecans, cashews, hazelnuts) and nut butters, sesame seeds, and tDahini paste.  Condiments/Miscellaneous: Chocolate, carob, marmalade, poppy seeds, instant iced tea, and juice from high-oxalate fruits.  Document Released: 09/20/2010 Document Revised: 11/25/2011 Document Reviewed: 11/10/2011 [facility] Patient Information 2014 [place]. Kidney Stones Kidney stones (urolithiasis) are deposits that form inside your kidneys. The intense pain is caused by the stone moving through the urinary tract. When the stone moves, the ureter goes into spasm around the stone. The stone is usually passed in the urine.  CAUSES   A disorder that makes certain neck glands produce too much parathyroid hormone (primary hyperparathyroidism).  A buildup of uric acid crystals, similar to gout in your joints.  Narrowing (stricture) of the ureter.  A kidney obstruction present at birth (congenital obstruction).  Previous surgery on the kidney or ureters.  Numerous kidney infections. SYMPTOMS   Feeling sick to your stomach (nauseous).  Throwing up  (vomiting).  Blood in the urine (hematuria).  Pain that usually spreads (radiates) to the groin.  Frequency or urgency of urination. DIAGNOSIS   Taking a history and physical exam.  Blood or urine tests.  CT scan.  Occasionally, an examination of the inside of the urinary bladder (cystoscopy) is performed. TREATMENT   Observation.  Increasing your fluid intake.  Extracorporeal shock wave lithotripsy This is a noninvasive procedure that uses shock waves to break up kidney stones.  Surgery may be needed if you have severe pain or persistent obstruction. There are various surgical procedures. Most of the procedures are performed with the use of small instruments. Only small incisions are needed to accommodate these instruments, so recovery time is minimized. The size, location, and chemical composition are all important variables that will determine the proper choice of action for you. Talk to your health care provider to better understand your situation so that you will minimize the risk of injury to yourself and your kidney.  HOME CARE INSTRUCTIONS   Drink enough water and fluids to keep your urine clear or pale yellow. This will help you to pass the stone or stone fragments.  Strain all urine through the provided strainer. Keep all particulate matter and stones for your health care provider to see. The stone causing the pain may be as small as a  grain of salt. It is very important to use the strainer each and every time you pass your urine. The collection of your stone will allow your health care provider to analyze it and verify that a stone has actually passed. The stone analysis will often identify what you can do to reduce the incidence of recurrences.  Only take over-the-counter or prescription medicines for pain, discomfort, or fever as directed by your health care provider.  Make a follow-up appointment with your health care provider as directed.  Get follow-up X-rays if  required. The absence of pain does not always mean that the stone has passed. It may have only stopped moving. If the urine remains completely obstructed, it can cause loss of kidney function or even complete destruction of the kidney. It is your responsibility to make sure X-rays and follow-ups are completed. Ultrasounds of the kidney can show blockages and the status of the kidney. Ultrasounds are not associated with any radiation and can be performed easily in a matter of minutes. SEEK MEDICAL CARE IF:  You experience pain that is progressive and unresponsive to any pain medicine you have been prescribed. SEEK IMMEDIATE MEDICAL CARE IF:   Pain cannot be controlled with the prescribed medicine.  You have a fever or shaking chills.  The severity or intensity of pain increases over 18 hours and is not relieved by pain medicine.  You develop a new onset of abdominal pain.  You feel faint or pass out.  You are unable to urinate. MAKE SURE YOU:   Understand these instructions.  Will watch your condition.  Will get help right away if you are not doing well or get worse. Document Released: 05/26/2005 Document Revised: 01/26/2013 Document Reviewed: 10/27/2012 [facility] Patient Information 2014 [place].   Emergency Department Resource Guide 1) Find a Doctor and Pay Out of Pocket Although you won't have to find out who is covered by your insurance plan, it is a good idea to ask around and get recommendations. You will then need to call the office and see if the doctor you have chosen will accept you as a new patient and what types of options they offer for patients who are self-pay. Some doctors offer discounts or will set up payment plans for their patients who do not have insurance, but you will need to ask so you aren't surprised when you get to your appointment.  2) Contact Your Local Health Department Not all health departments have doctors that can see patients for sick visits,  but many do, so it is worth a call to see if yours does. If you don't know where your local health department is, you can check in your phone book. The CDC also has a tool to help you locate your state's health department, and many state websites also have listings of all of their local health departments.  3) Find a [facility] If your illness is not likely to be very severe or complicated, you may want to try a walk in clinic. These are popping up all over the country in pharmacies, drugstores, and shopping centers. They're usually staffed by nurse practitioners or physician assistants that have been trained to treat common illnesses and complaints. They're usually fairly quick and inexpensive. However, if you have serious medical issues or chronic medical problems, these are probably not your best option.  No Primary Care Doctor: - Call Health Connect at  [phone] - they can help you locate a primary care doctor that  accepts your insurance, provides certain services, etc. - Physician Referral Service- [phone]  Chronic Pain Problems: Organization         Address  Phone   Notes  [facility]  [phone] Patients need to be referred by their primary care doctor.   Medication Assistance: Organization         Address  Phone   Notes  [facility] Medication [facility] [place] [place],  [address] [phone] --Must be a resident of [facility] -- Must have NO insurance coverage whatsoever (no Medicaid/ Medicare, etc.) -- The pt. MUST have a primary care doctor that directs their care regularly and follows them in the community   MedAssist  [phone]   [employer]  [phone]    Agencies that provide inexpensive medical care: Organization         Address  Phone   Notes  [place]  [phone]   [facility]    [phone]   [facility] [place],  [address] [phone]   [place] [address] [place] [phone]   Planned Parenthood    [phone]   [facility]    [phone]   [place] and [place] [address], [place] Phone:  [phone], Fax:  [phone] Hours of Operation:  9 am - 6 pm, M-F.  Also accepts Medicaid/Medicare and self-pay.  [facility] [place], [address], [place] Phone: [phone], Fax: [phone]. Hours of Operation:  8:30 am - 5:30 pm, M-F.  Also accepts Medicaid and self-pay.  [facility] [place] [address], [place] Phone: [phone]   [place], [place], [place] [phone], Ext. [phone] Mondays & Thursdays: 7-9 AM.  First 15 patients are seen on a first come, first serve basis.    [place] Providers:  Organization         Address  Phone   Notes  [facility] [address], [place] [phone] Also accepts self-pay patients.  [facility] [id number] [place], [place]  [phone]   [place], [address], [place] [phone]   [facility] [address] [place] [phone]   Lucianne Lei [address], [place]   [phone] Only accepts [place] Access [place] patients after they have their name applied to their card.   Self-Pay (no insurance) in [facility]:  Organization         Address  Phone   Notes  Sickle Cell Patients, [facility] [place] [phone]   [name] [place] [phone]   [facility] [place]  [place], [place],  [phone]   [facility]/Dr. Osei-Bonsu  [address],  [place] or [address], [place] [phone] Phone number for both [place] and [place] locations is the same.  Urgent Medical and [facility] [address] [place] [phone]   [facility] [address], [place] or [address] [phone]   [facility] [place] [phone], phone; [phone], fax Sees patients 1st and 3rd Saturday of every month.  Must not qualify for public or private insurance (i.e. Medicaid, Medicare, [place] Health Choice, Veterans' Benefits)  Household income should be no more than 200% of the poverty level The clinic cannot treat you if you are pregnant or think you are pregnant  Sexually transmitted diseases are not treated at the clinic.    Dental Care: Organization         Address  Phone  Notes  [facility] Department of [facility] [place] [phone] Accepts children up to age 14 who are enrolled in [place] or [place]; pregnant women with a Medicaid card; and children who have applied for Medicaid or [place] Health Choice, but were declined, whose parents can pay a reduced fee at time of service.  [facility] Department of [facility]  [address], [place] [phone] Accepts children up to age 82 who are enrolled in [place] or [place]; pregnant women with a Medicaid card; and children who have applied for Medicaid or [place] Health Choice, but were declined, whose parents can pay a reduced fee at time of service.  [place] Adult Dental Access PROGRAM  [place] [phone] Patients are seen by appointment only. Walk-ins are not accepted. [place] will see patients 7 years of age and older. Monday - Tuesday (8am-5pm) Most Wednesdays (8:30-5pm) $30 per visit, cash only  [facility] Adult Dental Access PROGRAM  [address], [facility] [phone] Patients are seen by appointment only. Walk-ins are not accepted. [place] will see patients 48 years of age and older. One Wednesday Evening (Monthly: Volunteer Based).  $30 per visit, cash only  [place]  [phone] for adults; Children under age 91, call Graduate Pediatric Dentistry at [phone]. Children aged 56-14, please call [phone] to request a pediatric application.  Dental services are provided in all areas of dental care including fillings, crowns and bridges, complete and partial dentures, implants, gum treatment, root canals, and extractions. Preventive care is also provided. Treatment is provided to both adults and children. Patients are selected via a lottery and there is often a waiting list.   [facility] [place] [address] [place]  [phone] [email or web address]   [facility] [address] [place], [place] [phone], Ext. [phone] Second and Fourth Thursday of each month, opens at 6:30 AM; Clinic ends at 9 AM.  Patients are seen on a first-come first-served basis, and a limited number are seen during each clinic.   [facility]  [address] Hillard Danker [place], [place] [phone]   Eligibility Requirements You must have lived in [place], [place], or [place] counties for at least the last three months.   You cannot be eligible for state or federal sponsored [employer], including [employer], [place], or [employer].   You generally cannot be eligible for healthcare insurance through your employer.    How to apply: Eligibility screenings are held every Tuesday and Wednesday afternoon from 1:00 pm until 4:00 pm. You do not need an appointment for the interview!  [facility] [address] [place], [place]   [place]  [place] Department  [place] Department  [place] in the Community: Intensive Outpatient Programs Organization         Address  Phone  Notes  [place] Guion. [address] [place], [place] [phone]   [facility] Outpatient [address] [place], [place]   ADS: Alcohol & Drug Svcs [address], [place], [place]   [place] [address]  [place], [place] or [phone]   Substance Abuse Resources Organization         Address  Phone  Notes  Alcohol and Drug Services  [phone]   [place]  [phone]   [facility] [place]   Chinita Pester  [phone]   Residential & Outpatient Substance Abuse Program  [phone]   Psychological Services Organization         Address  Phone  Notes  [facility] [place]  [place]  [phone]   [place] [address] [place] or [phone]    Mobile Crisis Teams Organization         Address  Phone  Notes  [facility], Mobile Crisis Care Unit  [phone]   [facility]  [address] [place], [place]   [place] [address] [place] [phone]    Self-Help/Support Groups Organization         Address  Phone             Notes  [place]. of [place] - variety of support groups  [place] Call for more information  Narcotics Anonymous (NA), [facility] [address], [employer] [place]  2 meetings at this location   Special educational needs teacher         Address  Phone  Notes  [facility] [place],    [place]  [phone]   [facility]  [address], [place] [id number], [place], [place]   [place]ulino Buchanan, [place]  [phone] Admissions: 8am-3pm M-F  Incentives Substance [place] [address],    [place], [place] [phone]   [facility] [address] [place], [place], [place]   [facility] [address]  [place], [place]   [facility] - Intensive Outpatient [address]een [address], [place], [place]   [name] [place].,  [place], [place] [phone] or [phone]   [facility] ([facility]) [address] [place], [place] Accepts Medicaid  [facility] [place] [address]  [place] [phone] Substance Abuse/Addiction Treatment   [facility] Organization         Address  Phone  Notes  [facility]  [phone]   Domenic Schwab, PhD [address] Arlis Porta [place], [place]   [phone] or [phone]   [place], [place] [phone]   [place] [address], [place], [place] [phone] Insurance/Medicaid/sponsorship through [employer] and Families [address], [place]                                    [place], [place] [phone] [place] [address]  [place], [place] [phone]    Dr. Adele Schilder  [phone]   [facility] [place] Dept. 1) [address] [place] 2) [place] 3)  [place] [address], [place] [phone]  [phone]   [place] [phone] or [phone] (After Hours)

## 2013-06-20 NOTE — ED Notes (Signed)
Pt c/o left sided flank pain and nausea, described as sharp pain. Hx of kidney last one 1.5 years ago. States slight discomfort with urination.

## 2013-06-20 NOTE — ED Provider Notes (Signed)
Medical screening examination/treatment/procedure(s) were performed by non-physician practitioner and as supervising physician I was immediately available for consultation/collaboration.  EKG Interpretation   None         Blanchie Dessert, MD 06/20/13 2104

## 2013-06-20 NOTE — Progress Notes (Signed)
[facility] provided pt with a list of self-pay PCPs, [place] orange Card application, and ACA information.

## 2013-06-20 NOTE — ED Provider Notes (Signed)
CSN: [id number]     Arrival date & time 06/20/13  1004 History   First MD Initiated Contact with Patient 06/20/13 1011     Chief Complaint  Patient presents with  . Flank Pain    left   (Consider location/radiation/quality/duration/timing/severity/associated sxs/prior Treatment) The history is provided by the patient. No language interpreter was used.  [name] is a 48 y/o M with PMhx of DM, obesity, GERD, kidney stones presenting to the ED with left flank pain, left sided abdominal pain, and nausea that started yesterday morning. Patient reported that the pain started at approximately 7:00AM yesterday morning and has been constant - stated that the pain has been progressively worse today. Described the pain as a constant stabbing sensation that starts in the left flank region with radiation to the LLQ and left groin region. Patient reported that he has been feeling nauseous. Stated that he has been having mild discomfort with urination, described as a mild burning sensation. Stated that he has been using Ibuprofen with minimal relief. Denied chest pain, shortness of breath, difficult breathing, hematuria, abdominal pain, vomiting, diarrhea, melena, hematochezia. PCP Dr. Lenna Gilford  Past Medical History  Diagnosis Date  . Hoarseness   . Cigarette smoker   . Diabetes mellitus   . Obesity   . 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 GERD (gastroesophageal reflux disease)   . IBS (irritable bowel syndrome)   . Anxiety   . History of renal calculi   . Atypical chest pain    Past Surgical History  Procedure Laterality Date  . Cholecystectomy     Family History  Problem Relation Age of Onset  . Diabetes Father   . Arthritis Father   . Diabetes Mother   . Arthritis Mother   . Hypertension Brother    History  Substance Use Topics  . Smoking status: Current Every Day Smoker -- 1.00 packs/day    Types: Cigarettes  . Smokeless tobacco:  Not on file  . Alcohol Use: Yes     Comment: social    Review of Systems  Constitutional: Negative for fever and chills.  HENT: Positive for congestion.   Respiratory: Positive for cough. Negative for chest tightness and shortness of breath.   Cardiovascular: Negative for chest pain.  Gastrointestinal: Positive for abdominal pain.  Genitourinary: Positive for flank pain (left sided). Negative for hematuria and decreased urine volume.  Musculoskeletal: Positive for back pain (left sided).  Neurological: Negative for weakness and headaches.  All other systems reviewed and are negative.    Allergies  Review of patient's allergies indicates no known allergies.  Home Medications   Current Outpatient Rx  Name  Route  Sig  Dispense  Refill  . ibuprofen (ADVIL,MOTRIN) 200 MG tablet   Oral   Take 400-600 mg by mouth every 6 (six) hours as needed for mild pain or moderate pain.         Marland Kitchen ondansetron (ZOFRAN) 4 MG tablet   Oral   Take 1 tablet (4 mg total) by mouth every 6 (six) hours.   12 tablet   0   . oxyCODONE-acetaminophen (PERCOCET/ROXICET) 5-325 MG per tablet   Oral   Take 1 tablet by mouth every 8 (eight) hours as needed for severe pain.   11 tablet   0    BP 127/82  Pulse 74  Temp(Src) 97.8 F (36.6 C) (Oral)  Resp 18  SpO2 95% Physical Exam  Nursing note and  vitals reviewed. Constitutional: He is oriented to person, place, and time. He appears well-developed and well-nourished. No distress.  HENT:  Head: Normocephalic and atraumatic.  Neck: Normal range of motion. Neck supple.  Cardiovascular: Normal rate, regular rhythm and normal heart sounds.  Exam reveals no friction rub.   No murmur heard. Pulses:      Radial pulses are 2+ on the right side, and 2+ on the left side.  Pulmonary/Chest: Effort normal and breath sounds normal. No respiratory distress. He has no wheezes. He has no rales.  Abdominal: Soft. Bowel sounds are normal. There is tenderness. There  is no guarding.  Obese  Discomfort upon palpation to the LLQ and left side of the abdomen Soft upon palpation  Positive left sided CVA tenderness  Musculoskeletal: Normal range of motion.       Back:  Full ROM to upper and lower extremities without difficulty noted, negative ataxia noted.  Neurological: He is alert and oriented to person, place, and time. He exhibits normal muscle tone. Coordination normal.  Skin: Skin is warm and dry. No rash noted. He is not diaphoretic. No erythema.  Psychiatric: He has a normal mood and affect. His behavior is normal. Thought content normal.    ED Course  Procedures (including critical care time)  2:09 PM This provider re-checked patient. Discussed labs and imaging. Reported that he still feels mild pain. Pain medications administered.   Results for orders placed during the hospital encounter of 06/20/13  URINALYSIS, ROUTINE W REFLEX MICROSCOPIC      Result Value Range   Color, Urine YELLOW  YELLOW   APPearance CLEAR  CLEAR   Specific Gravity, Urine 1.028  1.005 - 1.030   pH 5.5  5.0 - 8.0   Glucose, UA >1000 (*) NEGATIVE mg/dL   Hgb urine dipstick NEGATIVE  NEGATIVE   Bilirubin Urine NEGATIVE  NEGATIVE   Ketones, ur NEGATIVE  NEGATIVE mg/dL   Protein, ur NEGATIVE  NEGATIVE mg/dL   Urobilinogen, UA 1.0  0.0 - 1.0 mg/dL   Nitrite NEGATIVE  NEGATIVE   Leukocytes, UA NEGATIVE  NEGATIVE  URINE MICROSCOPIC-ADD ON      Result Value Range   Squamous Epithelial / LPF RARE  RARE   WBC, UA 0-2  <3 WBC/hpf   RBC / HPF 0-2  <3 RBC/hpf   Bacteria, UA RARE  RARE  CBC WITH DIFFERENTIAL      Result Value Range   WBC 5.5  4.0 - 10.5 K/uL   RBC 4.61  4.22 - 5.81 MIL/uL   Hemoglobin 14.2  13.0 - 17.0 g/dL   HCT 41.0  39.0 - 52.0 %   MCV 88.9  78.0 - 100.0 fL   MCH 30.8  26.0 - 34.0 pg   MCHC 34.6  30.0 - 36.0 g/dL   RDW 13.8  11.5 - 15.5 %   Platelets 230  150 - 400 K/uL   Neutrophils Relative % 50  43 - 77 %   Neutro Abs 2.7  1.7 - 7.7 K/uL    Lymphocytes Relative 41  12 - 46 %   Lymphs Abs 2.2  0.7 - 4.0 K/uL   Monocytes Relative 6  3 - 12 %   Monocytes Absolute 0.3  0.1 - 1.0 K/uL   Eosinophils Relative 3  0 - 5 %   Eosinophils Absolute 0.1  0.0 - 0.7 K/uL   Basophils Relative 1  0 - 1 %   Basophils Absolute 0.0  0.0 - 0.1  K/uL  BASIC METABOLIC PANEL      Result Value Range   Sodium 140  137 - 147 mEq/L   Potassium 4.1  3.7 - 5.3 mEq/L   Chloride 105  96 - 112 mEq/L   CO2 24  19 - 32 mEq/L   Glucose, Bld 254 (*) 70 - 99 mg/dL   BUN 12  6 - 23 mg/dL   Creatinine, Ser 0.61  0.50 - 1.35 mg/dL   Calcium 8.4  8.4 - 10.5 mg/dL   GFR calc non Af Amer >90  >90 mL/min   GFR calc Af Amer >90  >90 mL/min  LIPASE, BLOOD      Result Value Range   Lipase 40  11 - 59 U/L   Dg Chest 2 View  06/20/2013   CLINICAL DATA:  Flank pain, history of kidney stones  EXAM: CHEST  2 VIEW  COMPARISON:  Prior chest x-ray 01/03/2011  FINDINGS: Cardiomegaly with left heart enlargement. Stable linear scarring in the periphery of the left mid lung. Mild pulmonary vascular congestion without overt edema. No pneumothorax or pleural effusion. No acute osseous abnormality.  IMPRESSION: 1. Stable cardiomegaly with left heart enlargement. 2. Pulmonary vascular congestion without overt edema.   Electronically Signed   By: Jacqulynn Cadet M.D.   On: 06/20/2013 13:13   US Renal  06/20/2013   CLINICAL DATA:  Left flank pain.  History kidney stones.  EXAM: RENAL/URINARY TRACT ULTRASOUND COMPLETE  COMPARISON:  CT urogram from 08/06/2008  FINDINGS: Right Kidney:  Length: 11.8 cm. Echogenicity within normal limits. No evidence for renal stone. No mass or hydronephrosis visualized.  Left Kidney:  Length: 12.2 cm. Normal echogenicity without evidence for mass or hydronephrosis. No renal calculus is evident.  Bladder:  Appears normal for degree of bladder distention.  IMPRESSION: Unremarkable urinary tract ultrasound.   Electronically Signed   By: Misty Stanley M.D.   On:  06/20/2013 12:16   Labs Review Labs Reviewed  URINALYSIS, ROUTINE W REFLEX MICROSCOPIC - Abnormal; Notable for the following:    Glucose, UA >1000 (*)    All other components within normal limits  BASIC METABOLIC PANEL - Abnormal; Notable for the following:    Glucose, Bld 254 (*)    All other components within normal limits  URINE MICROSCOPIC-ADD ON  CBC WITH DIFFERENTIAL  LIPASE, BLOOD   Imaging Review Dg Chest 2 View  06/20/2013   CLINICAL DATA:  Flank pain, history of kidney stones  EXAM: CHEST  2 VIEW  COMPARISON:  Prior chest x-ray 01/03/2011  FINDINGS: Cardiomegaly with left heart enlargement. Stable linear scarring in the periphery of the left mid lung. Mild pulmonary vascular congestion without overt edema. No pneumothorax or pleural effusion. No acute osseous abnormality.  IMPRESSION: 1. Stable cardiomegaly with left heart enlargement. 2. Pulmonary vascular congestion without overt edema.   Electronically Signed   By: Jacqulynn Cadet M.D.   On: 06/20/2013 13:13   US Renal  06/20/2013   CLINICAL DATA:  Left flank pain.  History kidney stones.  EXAM: RENAL/URINARY TRACT ULTRASOUND COMPLETE  COMPARISON:  CT urogram from 08/06/2008  FINDINGS: Right Kidney:  Length: 11.8 cm. Echogenicity within normal limits. No evidence for renal stone. No mass or hydronephrosis visualized.  Left Kidney:  Length: 12.2 cm. Normal echogenicity without evidence for mass or hydronephrosis. No renal calculus is evident.  Bladder:  Appears normal for degree of bladder distention.  IMPRESSION: Unremarkable urinary tract ultrasound.   Electronically Signed   By: Randall Hiss  Tery Sanfilippo M.D.   On: 06/20/2013 12:16    EKG Interpretation   None       MDM   1. Flank pain   2. Nephrolithiasis     Medications  oxyCODONE-acetaminophen (PERCOCET/ROXICET) 5-325 MG per tablet 2 tablet (2 tablets Oral Not Given 06/20/13 1417)  ketorolac (TORADOL) 30 MG/ML injection 30 mg (30 mg Intravenous Given 06/20/13 1131)    ondansetron (ZOFRAN) injection 4 mg (4 mg Intravenous Given 06/20/13 1129)   Filed Vitals:   06/20/13 1022  BP: 127/82  Pulse: 74  Temp: 97.8 F (36.6 C)  TempSrc: Oral  Resp: 18  SpO2: 95%    Patient presenting to the ED with left flank pain, nausea, and dysuria that started yesterday morning abruptly and has gotten progressively worse. Patient reported that he has been using advil with minimal relief. Patient reported that he has history of kidney stones. Alert and oriented. GCS 15. Heart rate and rhythm normal. Lungs clear to auscultation. Radial pulses 2+ bilaterally. BS normoactive in all 4 quadrants. Discomfort upon palpation to the left side of the abdomen. Positive CVA tenderness.  UA negative findings - increased glucose in urine. Lipase negative elevation. BMP negative findings-a non-gap of 11.0 mEq per liter-patient not in DKA. CBC negative findings. Renal ultrasound negative for hydronephrosis or renal calculi. Chest x-ray noted pulmonary vascular congestion without overt edema-negative acute cardiopulmonary disease. Doubt pneumonia. Doubt pyelonephritis. Suspicion to be possible nephrolithiasis without obstruction. Patient stable, afebrile. Nausea controlled in ED setting. Patient able to tolerate PO fluids - negative episodes while in ED setting. Patient stable, afebrile.  3:11 PM This provider was going to discharge patient and discuss plan. This provider went to discuss with patient and patient was no where to be found. Patient left the hospital and did not want to wait anymore. Patient left without prescriptions and information for outpatient follow-up. Nurse reported that patient did not take the medications (percocets) because he did not want to feel sleepy. Patient left without discussion and without paperwork because he did not want to wait.   Jamse Mead, PA-C 06/20/13 1724

## 2013-07-28 ENCOUNTER — Emergency Department (HOSPITAL_COMMUNITY): Payer: Medicaid Other

## 2013-07-28 ENCOUNTER — Encounter (HOSPITAL_COMMUNITY): Payer: Self-pay | Admitting: General Practice

## 2013-07-28 ENCOUNTER — Observation Stay (HOSPITAL_COMMUNITY)
Admission: EM | Admit: 2013-07-28 | Discharge: 2013-07-29 | Disposition: A | Discharge status: Home / Self Care | Payer: Medicaid Other | Attending: Internal Medicine | Admitting: Internal Medicine

## 2013-07-28 DIAGNOSIS — F411 Generalized anxiety disorder: Secondary | ICD-10-CM | POA: Diagnosis not present

## 2013-07-28 DIAGNOSIS — R079 Chest pain, unspecified: Secondary | ICD-10-CM

## 2013-07-28 DIAGNOSIS — F17200 Nicotine dependence, unspecified, uncomplicated: Secondary | ICD-10-CM | POA: Diagnosis not present

## 2013-07-28 DIAGNOSIS — I509 Heart failure, unspecified: Secondary | ICD-10-CM

## 2013-07-28 DIAGNOSIS — K219 Gastro-esophageal reflux disease without esophagitis: Secondary | ICD-10-CM

## 2013-07-28 DIAGNOSIS — Y9389 Activity, other specified: Secondary | ICD-10-CM | POA: Insufficient documentation

## 2013-07-28 DIAGNOSIS — N200 Calculus of kidney: Secondary | ICD-10-CM

## 2013-07-28 DIAGNOSIS — S59909A Unspecified injury of unspecified elbow, initial encounter: Secondary | ICD-10-CM | POA: Diagnosis present

## 2013-07-28 DIAGNOSIS — E236 Other disorders of pituitary gland: Secondary | ICD-10-CM

## 2013-07-28 DIAGNOSIS — IMO0002 Reserved for concepts with insufficient information to code with codable children: Secondary | ICD-10-CM | POA: Diagnosis present

## 2013-07-28 DIAGNOSIS — E1165 Type 2 diabetes mellitus with hyperglycemia: Secondary | ICD-10-CM | POA: Diagnosis present

## 2013-07-28 DIAGNOSIS — J209 Acute bronchitis, unspecified: Secondary | ICD-10-CM

## 2013-07-28 DIAGNOSIS — R0789 Other chest pain: Secondary | ICD-10-CM | POA: Diagnosis present

## 2013-07-28 DIAGNOSIS — Y9289 Other specified places as the place of occurrence of the external cause: Secondary | ICD-10-CM | POA: Diagnosis not present

## 2013-07-28 DIAGNOSIS — W1809XA Striking against other object with subsequent fall, initial encounter: Secondary | ICD-10-CM | POA: Insufficient documentation

## 2013-07-28 DIAGNOSIS — L74519 Primary focal hyperhidrosis, unspecified: Secondary | ICD-10-CM | POA: Diagnosis not present

## 2013-07-28 DIAGNOSIS — Z87891 Personal history of nicotine dependence: Secondary | ICD-10-CM | POA: Diagnosis present

## 2013-07-28 DIAGNOSIS — E119 Type 2 diabetes mellitus without complications: Secondary | ICD-10-CM | POA: Diagnosis not present

## 2013-07-28 DIAGNOSIS — R7309 Other abnormal glucose: Secondary | ICD-10-CM

## 2013-07-28 DIAGNOSIS — K589 Irritable bowel syndrome without diarrhea: Secondary | ICD-10-CM

## 2013-07-28 DIAGNOSIS — G8929 Other chronic pain: Secondary | ICD-10-CM | POA: Diagnosis present

## 2013-07-28 DIAGNOSIS — I428 Other cardiomyopathies: Secondary | ICD-10-CM | POA: Insufficient documentation

## 2013-07-28 DIAGNOSIS — S42309A Unspecified fracture of shaft of humerus, unspecified arm, initial encounter for closed fracture: Principal | ICD-10-CM

## 2013-07-28 DIAGNOSIS — Z87442 Personal history of urinary calculi: Secondary | ICD-10-CM | POA: Insufficient documentation

## 2013-07-28 DIAGNOSIS — I429 Cardiomyopathy, unspecified: Secondary | ICD-10-CM

## 2013-07-28 DIAGNOSIS — E669 Obesity, unspecified: Secondary | ICD-10-CM | POA: Diagnosis not present

## 2013-07-28 DIAGNOSIS — R490 Dysphonia: Secondary | ICD-10-CM

## 2013-07-28 LAB — COMPREHENSIVE METABOLIC PANEL
ALBUMIN: 4.1 g/dL (ref 3.5–5.2)
ALT: 50 U/L (ref 0–53)
AST: 87 U/L — ABNORMAL HIGH (ref 0–37)
Alkaline Phosphatase: 64 U/L (ref 39–117)
BUN: 11 mg/dL (ref 6–23)
CHLORIDE: 104 meq/L (ref 96–112)
CO2: 24 mEq/L (ref 19–32)
Calcium: 8.8 mg/dL (ref 8.4–10.5)
Creatinine, Ser: 0.7 mg/dL (ref 0.50–1.35)
Glucose, Bld: 252 mg/dL — ABNORMAL HIGH (ref 70–99)
Potassium: 4.5 mEq/L (ref 3.7–5.3)
SODIUM: 144 meq/L (ref 137–147)
Total Bilirubin: 0.7 mg/dL (ref 0.3–1.2)
Total Protein: 7.5 g/dL (ref 6.0–8.3)

## 2013-07-28 LAB — CBC
HCT: 41.9 % (ref 39.0–52.0)
Hemoglobin: 14.7 g/dL (ref 13.0–17.0)
MCH: 31.3 pg (ref 26.0–34.0)
MCHC: 35.1 g/dL (ref 30.0–36.0)
MCV: 89.3 fL (ref 78.0–100.0)
PLATELETS: 223 10*3/uL (ref 150–400)
RBC: 4.69 MIL/uL (ref 4.22–5.81)
RDW: 13.7 % (ref 11.5–15.5)
WBC: 12.5 10*3/uL — ABNORMAL HIGH (ref 4.0–10.5)

## 2013-07-28 LAB — GLUCOSE, CAPILLARY: Glucose-Capillary: 213 mg/dL — ABNORMAL HIGH (ref 70–99)

## 2013-07-28 LAB — TROPONIN I

## 2013-07-28 LAB — PRO B NATRIURETIC PEPTIDE: PRO B NATRI PEPTIDE: 812.9 pg/mL — AB (ref 0–125)

## 2013-07-28 MED ORDER — LORAZEPAM 2 MG/ML IJ SOLN
1.0000 mg | Freq: Once | INTRAMUSCULAR | Status: AC
  Administered 2013-07-28: 1 mg via INTRAVENOUS

## 2013-07-28 MED ORDER — ONDANSETRON 4 MG PO TBDP
8.0000 mg | ORAL_TABLET | Freq: Once | ORAL | Status: AC
  Administered 2013-07-28: 8 mg via ORAL
  Filled 2013-07-28: qty 2

## 2013-07-28 MED ORDER — ONDANSETRON HCL 4 MG/2ML IJ SOLN
4.0000 mg | Freq: Once | INTRAMUSCULAR | Status: AC
  Administered 2013-07-28: 4 mg via INTRAVENOUS
  Filled 2013-07-28: qty 2

## 2013-07-28 MED ORDER — ENOXAPARIN SODIUM 40 MG/0.4ML ~~LOC~~ SOLN
40.0000 mg | Freq: Every day | SUBCUTANEOUS | Status: DC
  Filled 2013-07-28: qty 0.4

## 2013-07-28 MED ORDER — INSULIN ASPART 100 UNIT/ML ~~LOC~~ SOLN
0.0000 [IU] | Freq: Every day | SUBCUTANEOUS | Status: DC
  Administered 2013-07-29: 2 [IU] via SUBCUTANEOUS

## 2013-07-28 MED ORDER — LORAZEPAM 2 MG/ML IJ SOLN
INTRAMUSCULAR | Status: AC
  Administered 2013-07-28: 1 mg via INTRAVENOUS
  Filled 2013-07-28: qty 1

## 2013-07-28 MED ORDER — INSULIN ASPART 100 UNIT/ML ~~LOC~~ SOLN
0.0000 [IU] | Freq: Three times a day (TID) | SUBCUTANEOUS | Status: DC
  Administered 2013-07-29: 7 [IU] via SUBCUTANEOUS

## 2013-07-28 MED ORDER — HYDROMORPHONE HCL PF 1 MG/ML IJ SOLN
1.0000 mg | Freq: Once | INTRAMUSCULAR | Status: AC
  Administered 2013-07-28: 1 mg via INTRAVENOUS
  Filled 2013-07-28: qty 1

## 2013-07-28 MED ORDER — ASPIRIN EC 325 MG PO TBEC
325.0000 mg | DELAYED_RELEASE_TABLET | Freq: Every day | ORAL | Status: DC
  Administered 2013-07-29 (×2): 325 mg via ORAL
  Filled 2013-07-28 (×2): qty 1

## 2013-07-28 MED ORDER — SODIUM CHLORIDE 0.9 % IV BOLUS (SEPSIS)
1000.0000 mL | Freq: Once | INTRAVENOUS | Status: AC
  Administered 2013-07-28: 1000 mL via INTRAVENOUS

## 2013-07-28 MED ORDER — ONDANSETRON HCL 4 MG/2ML IJ SOLN
4.0000 mg | Freq: Four times a day (QID) | INTRAMUSCULAR | Status: DC | PRN
  Administered 2013-07-29: 4 mg via INTRAVENOUS
  Filled 2013-07-28: qty 2

## 2013-07-28 MED ORDER — HYDROMORPHONE HCL PF 1 MG/ML IJ SOLN
0.5000 mg | INTRAMUSCULAR | Status: DC | PRN
  Filled 2013-07-28: qty 1

## 2013-07-28 MED ORDER — OXYCODONE-ACETAMINOPHEN 5-325 MG PO TABS
2.0000 | ORAL_TABLET | Freq: Once | ORAL | Status: DC
  Filled 2013-07-28: qty 2

## 2013-07-28 NOTE — ED Provider Notes (Signed)
CSN: [id number]     Arrival date & time 07/28/13  1809 History   First MD Initiated Contact with Patient 07/28/13 1821     No chief complaint on file.    (Consider location/radiation/quality/duration/timing/severity/associated sxs/prior Treatment) HPI Comments: 48 year old male presents after a fall on the ice. He states his left arm got trapped underneath him and had severe upper arm pain. Has not had any numbness or weakness. EMS states that his pain is only mildly controlled when the arm is held in traction otherwise his upper arm fracture. Very unstable and the patient's pain is severe. He received 250 mcg of fentanyl prior to arrival. He feels nauseous now but otherwise still has severe pain. Did not injure anything else. Denied his head or lose consciousness. Further history and review of systems is limited due to the patient's pain.   Past Medical History  Diagnosis Date  . Hoarseness   . Cigarette smoker   . Diabetes mellitus   . Obesity   . 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 GERD (gastroesophageal reflux disease)   . IBS (irritable bowel syndrome)   . Anxiety   . History of renal calculi   . Atypical chest pain    Past Surgical History  Procedure Laterality Date  . Cholecystectomy     Family History  Problem Relation Age of Onset  . Diabetes Father   . Arthritis Father   . Diabetes Mother   . Arthritis Mother   . Hypertension Brother    History  Substance Use Topics  . Smoking status: Current Every Day Smoker -- 1.00 packs/day    Types: Cigarettes  . Smokeless tobacco: Not on file  . Alcohol Use: Yes     Comment: social    Review of Systems  Unable to perform ROS: Other  Musculoskeletal: Positive for joint swelling.  Skin: Negative for color change and wound.  Neurological: Negative for weakness and numbness.      Allergies  Review of patient's allergies indicates no known allergies.  Home  Medications   Current Outpatient Rx  Name  Route  Sig  Dispense  Refill  . ibuprofen (ADVIL,MOTRIN) 200 MG tablet   Oral   Take 400-600 mg by mouth every 6 (six) hours as needed for mild pain or moderate pain.         Marland Kitchen ondansetron (ZOFRAN) 4 MG tablet   Oral   Take 1 tablet (4 mg total) by mouth every 6 (six) hours.   12 tablet   0   . oxyCODONE-acetaminophen (PERCOCET/ROXICET) 5-325 MG per tablet   Oral   Take 1 tablet by mouth every 8 (eight) hours as needed for severe pain.   11 tablet   0    BP 138/90  Pulse 92  Temp(Src) 97.9 F (36.6 C) (Oral)  Resp 20  Ht 6\' 1"  (1.854 m)  Wt 264 lb (119.75 kg)  BMI 34.84 kg/m2  SpO2 96% Physical Exam  Nursing note and vitals reviewed. Constitutional: He is oriented to person, place, and time. He appears well-developed and well-nourished.  HENT:  Head: Normocephalic and atraumatic.  Right Ear: External ear normal.  Left Ear: External ear normal.  Nose: Nose normal.  Eyes: Right eye exhibits no discharge. Left eye exhibits no discharge.  Neck: Neck supple.  Cardiovascular: Normal rate, regular rhythm, normal heart sounds and intact distal pulses.   Pulses:      Radial pulses are 2+ on the  right side, and 2+ on the left side.  Pulmonary/Chest: Effort normal.  Abdominal: He exhibits no distension.  Musculoskeletal:       Left upper arm: He exhibits tenderness, swelling and deformity.  Neurological: He is alert and oriented to person, place, and time.  Skin: Skin is warm. He is diaphoretic.    ED Course  Procedures (including critical care time) Labs Review Labs Reviewed - No data to display Imaging Review Dg Humerus Left  07/28/2013   CLINICAL DATA:  Left upper arm deformity  EXAM: LEFT HUMERUS - 2+ VIEW  COMPARISON:  None.  FINDINGS: There is a horizontal fracture through the midshaft left humerus with medial angulation of the distal fracture fragment. The distal fracture fragment is displaced 1 bone with with 15 mm  override.  IMPRESSION: 1. Horizontal fracture through the midshaft left humerus with 1 bone width medial displacement of the distal fracture fragment.   Electronically Signed   By: Suzy Bouchard M.D.   On: 07/28/2013 19:15    EKG Interpretation    Date/Time:  Thursday July 28 2013 19:32:45 EST Ventricular Rate:  95 PR Interval:  176 QRS Duration: 103 QT Interval:  365 QTC Calculation: 459 R Axis:   48 Text Interpretation:  Sinus rhythm no acute ischemia Confirmed by Malaquias Lenker  MD, Meriam Chojnowski ([id number]) on 07/28/2013 9:00:34 PM            MDM   Final diagnoses:  Humerus fracture    Patient NV intact, full ROM of wrist, including wrist extension. Normal pulse, cap refill. Pain improved with traction. Splinted in traction but still angulated. Vaguely complaining of CP but unable to give good history due to being sleepy from pain meds. Will get EKG, labs and reassess when more awake but I doubt acute ischemia.    Ephraim Hamburger, MD 07/28/13 2103

## 2013-07-28 NOTE — ED Notes (Addendum)
Per EMS pt fell on ice, arm fell behind him. Obvious deformity to left arm. Crepitus at elbow. 250mg  of fentanyl given by ems

## 2013-07-28 NOTE — ED Notes (Signed)
Pt appears distressed, moaning and screaming, swelling noted to left arm. Breathing labored, and even. Radial pulses present bilaterally. Upon EMS arrival

## 2013-07-28 NOTE — ED Provider Notes (Signed)
[name] S 8:30 p.m. patient discussed and signed. Patient presenting after a fall. He has a fractured left humerus. Orthopedics was consulted and patient has been placed in a splint and sling. He continues to have significant pains. He has also complained of chest pain. Additional lab testing and chest x-ray pending.   9:40 PM chest x-ray shows enlarged heart with mild pulmonary edema. Patient has no significant edema of the extremities. Is complaining of slight shortness of breath. Is on O2 nasal cannula. Denies any chest pain at this time. Does complain of continued arm pain.  10:05 PM spoke with [facility] hospitalist who will see patient and admit. Would like tele bed on obs.  Martie Lee, PA-C 07/28/13 2209

## 2013-07-28 NOTE — ED Provider Notes (Signed)
   7:07 PM Discussed with Dr Sharol Given on call for orthopedic surgery.  He reports that we should splint and sling the patient and he will see him in the office for follow-up next week.    I personally reviewed the imaging tests through PACS system.     7:25PM Discussed with patient and family I consult with Dr. Sharol Given. Splint is in place and patient's pain is improved. Patient now complaining of 3 days of substernal chest pain and shortness of breath.  He is sleepy and unable to provide further information. I discussed this with Dr. Regenia Skeeter and we will proceed with cardiac workup.  7:55 PM He continues to have pain. Will transition to oral medications.  ECG unremarkable.  \  8:34 PM Discussed with Hazel Sams, PA-C who will complete cardiac work-up and dispo accordingly.    Jarrett Soho Alise Calais, PA-C 07/28/13 2035

## 2013-07-28 NOTE — Progress Notes (Signed)
Orthopedic Tech Progress Note Patient Details:  [name] May 22, 1966 [id number]  Ortho Devices Type of Ortho Device: Ace wrap;Coapt;Sling immobilizer Ortho Device/Splint Location: lue Ortho Device/Splint Interventions: Application   Leronda Lewers 07/28/2013, 7:42 PM

## 2013-07-28 NOTE — ED Notes (Signed)
Orthopedic technicians at bedside providing temp casting and traction.

## 2013-07-29 ENCOUNTER — Encounter (HOSPITAL_COMMUNITY): Payer: Self-pay | Admitting: Cardiology

## 2013-07-29 ENCOUNTER — Observation Stay (HOSPITAL_COMMUNITY): Payer: Medicaid Other

## 2013-07-29 DIAGNOSIS — S42309A Unspecified fracture of shaft of humerus, unspecified arm, initial encounter for closed fracture: Secondary | ICD-10-CM | POA: Diagnosis present

## 2013-07-29 DIAGNOSIS — I429 Cardiomyopathy, unspecified: Secondary | ICD-10-CM

## 2013-07-29 DIAGNOSIS — I319 Disease of pericardium, unspecified: Secondary | ICD-10-CM

## 2013-07-29 LAB — TROPONIN I
Troponin I: <0.3 ng/mL (ref <0.30)
Troponin I: <0.3 ng/mL (ref <0.30)

## 2013-07-29 LAB — COMPREHENSIVE METABOLIC PANEL
ALK PHOS: 68 U/L (ref 39–117)
ALT: 55 U/L — ABNORMAL HIGH (ref 0–53)
AST: 52 U/L — ABNORMAL HIGH (ref 0–37)
Albumin: 3.8 g/dL (ref 3.5–5.2)
BUN: 10 mg/dL (ref 6–23)
CALCIUM: 9.2 mg/dL (ref 8.4–10.5)
CO2: 25 mEq/L (ref 19–32)
Chloride: 104 mEq/L (ref 96–112)
Creatinine, Ser: 0.78 mg/dL (ref 0.50–1.35)
GFR calc Af Amer: >90 mL/min (ref >90)
GFR calc non Af Amer: >90 mL/min (ref >90)
Glucose, Bld: 279 mg/dL — ABNORMAL HIGH (ref 70–99)
POTASSIUM: 4.2 meq/L (ref 3.7–5.3)
Sodium: 142 mEq/L (ref 137–147)
TOTAL PROTEIN: 7.4 g/dL (ref 6.0–8.3)
Total Bilirubin: 0.5 mg/dL (ref 0.3–1.2)

## 2013-07-29 LAB — CBC WITH DIFFERENTIAL/PLATELET
BASOS ABS: 0 10*3/uL (ref 0.0–0.1)
Basophils Relative: 0 % (ref 0–1)
EOS PCT: 0 % (ref 0–5)
Eosinophils Absolute: 0 10*3/uL (ref 0.0–0.7)
HCT: 41.3 % (ref 39.0–52.0)
Hemoglobin: 14.4 g/dL (ref 13.0–17.0)
Lymphocytes Relative: 21 % (ref 12–46)
Lymphs Abs: 2.1 10*3/uL (ref 0.7–4.0)
MCH: 31.3 pg (ref 26.0–34.0)
MCHC: 34.9 g/dL (ref 30.0–36.0)
MCV: 89.8 fL (ref 78.0–100.0)
Monocytes Absolute: 0.8 10*3/uL (ref 0.1–1.0)
Monocytes Relative: 8 % (ref 3–12)
NEUTROS PCT: 71 % (ref 43–77)
Neutro Abs: 7.1 10*3/uL (ref 1.7–7.7)
PLATELETS: 245 10*3/uL (ref 150–400)
RBC: 4.6 MIL/uL (ref 4.22–5.81)
RDW: 14 % (ref 11.5–15.5)
WBC: 10 10*3/uL (ref 4.0–10.5)

## 2013-07-29 LAB — PROTIME-INR
INR: 1.08 (ref 0.00–1.49)
Prothrombin Time: 13.8 seconds (ref 11.6–15.2)

## 2013-07-29 LAB — GLUCOSE, CAPILLARY
GLUCOSE-CAPILLARY: 302 mg/dL — AB (ref 70–99)
Glucose-Capillary: 191 mg/dL — ABNORMAL HIGH (ref 70–99)

## 2013-07-29 LAB — TSH: TSH: 0.988 u[IU]/mL (ref 0.350–4.500)

## 2013-07-29 LAB — HEMOGLOBIN A1C
HEMOGLOBIN A1C: 10.8 % — AB (ref <5.7)
MEAN PLASMA GLUCOSE: 263 mg/dL — AB (ref <117)

## 2013-07-29 MED ORDER — OXYCODONE HCL 5 MG PO TABS
5.0000 mg | ORAL_TABLET | ORAL | Status: DC | PRN
  Administered 2013-07-29 (×2): 5 mg via ORAL
  Filled 2013-07-29 (×2): qty 1

## 2013-07-29 MED ORDER — LISINOPRIL 2.5 MG PO TABS
2.5000 mg | ORAL_TABLET | Freq: Every day | ORAL | Status: DC
  Administered 2013-07-29: 2.5 mg via ORAL
  Filled 2013-07-29: qty 1

## 2013-07-29 MED ORDER — IPRATROPIUM-ALBUTEROL 0.5-2.5 (3) MG/3ML IN SOLN
3.0000 mL | RESPIRATORY_TRACT | Status: DC
  Administered 2013-07-29 (×3): 3 mL via RESPIRATORY_TRACT
  Filled 2013-07-29 (×3): qty 3

## 2013-07-29 MED ORDER — CARVEDILOL 3.125 MG PO TABS
3.1250 mg | ORAL_TABLET | Freq: Two times a day (BID) | ORAL | Status: DC
  Administered 2013-07-29: 3.125 mg via ORAL
  Filled 2013-07-29 (×2): qty 1

## 2013-07-29 MED ORDER — METFORMIN HCL 500 MG PO TABS: 500.0000 mg | ORAL_TABLET | Freq: Two times a day (BID) | ORAL | Status: DC

## 2013-07-29 MED ORDER — OXYCODONE-ACETAMINOPHEN 5-325 MG PO TABS: 2.0000 | ORAL_TABLET | Freq: Once | ORAL | Status: DC

## 2013-07-29 MED ORDER — CARVEDILOL 3.125 MG PO TABS: 3.1250 mg | ORAL_TABLET | Freq: Two times a day (BID) | ORAL | Status: DC

## 2013-07-29 MED ORDER — ASPIRIN EC 81 MG PO TBEC
81.0000 mg | DELAYED_RELEASE_TABLET | Freq: Every day | ORAL | Status: DC
Start: 2013-07-30 — End: 2013-07-29

## 2013-07-29 MED ORDER — LISINOPRIL 2.5 MG PO TABS: 2.5000 mg | ORAL_TABLET | Freq: Every day | ORAL | Status: DC

## 2013-07-29 MED ORDER — ASPIRIN 81 MG PO TBEC: 81.0000 mg | DELAYED_RELEASE_TABLET | Freq: Every day | ORAL | Status: DC

## 2013-07-29 MED ORDER — NICOTINE 14 MG/24HR TD PT24
14.0000 mg | MEDICATED_PATCH | Freq: Every day | TRANSDERMAL | Status: DC
  Filled 2013-07-29: qty 1

## 2013-07-29 NOTE — Progress Notes (Signed)
UR completed 

## 2013-07-29 NOTE — H&P (Signed)
[facility] History and Physical  Patient: [name]  [id number]  DOB: 1966/05/10  DOS: the patient was seen and examined on 07/28/2013 PCP: Noralee Space, MD  Chief Complaint: Chest pain and shoulder pain  HPI: [name] is a 48 y.o. male with Past medical history of morbid obesity, GERD, IBS, active smoker. The patient is coming from home. The patient presented with a mechanical fall. He was walking and he slipped on ice and fell on his left side. It wasn't able to move his shoulder due to severe pain in was brought into the hospital. When she was in the ED he started having complaints of substernal chest pain along with palpitation occurring intermittently. He mentions he had similar pain earlier in the day. At the time of my evaluation he was pain-free. He denies any complaint of cough, orthopnea, PND, fever, chills, recent travel or immobilization, recent surgery. His primary complaint is pain in the shoulder.  Review of Systems: as mentioned in the history of present illness.  A Comprehensive review of the other systems is negative.  Past Medical History  Diagnosis Date  . Hoarseness   . Cigarette smoker   . Diabetes mellitus   . Obesity   . 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 GERD (gastroesophageal reflux disease)   . IBS (irritable bowel syndrome)   . Anxiety   . History of renal calculi   . Atypical chest pain    Past Surgical History  Procedure Laterality Date  . Cholecystectomy     Social History:  reports that he has been smoking Cigarettes.  He has been smoking about 1.00 pack per day. He does not have any smokeless tobacco history on file. He reports that he drinks alcohol. He reports that he does not use illicit drugs. Independent for most of his  ADL.  No Known Allergies  Family History  Problem Relation Age of Onset  . Diabetes Father   . Arthritis Father   . Diabetes Mother   .  Arthritis Mother   . Hypertension Brother     Prior to Admission medications   Medication Sig Start Date End Date Taking? Authorizing Provider  ibuprofen (ADVIL,MOTRIN) 200 MG tablet Take 400-600 mg by mouth every 6 (six) hours as needed for mild pain or moderate pain.    Historical Provider, MD  oxyCODONE-acetaminophen (PERCOCET/ROXICET) 5-325 MG per tablet Take 1 tablet by mouth every 8 (eight) hours as needed for severe pain. 06/20/13   Jamse Mead, PA-C    Physical Exam: Filed Vitals:   07/28/13 1930 07/28/13 1945 07/28/13 1949 07/28/13 2230  BP: 134/86 138/90 138/90 120/81  Pulse: 96 95 92 104  Temp:      TempSrc:      Resp:    22  Height:      Weight:      SpO2: 95% 97% 96% 98%    General: Alert, Awake and Oriented to Time, Place and Person. Appear in mild distress Eyes: PERRL ENT: Oral Mucosa clear moist. Neck: Difficult to assess JVD Cardiovascular: S1 and S2 Present, no Murmur, Peripheral Pulses Present Respiratory: Bilateral Air entry equal and Decreased, Clear to Auscultation,  No Crackles, expiratory wheezes Abdomen: Bowel Sound Present, Soft and Non tender Skin: No Rash Extremities: Left shoulder wrapped,no Pedal edema, no calf tenderness Neurologic: Grossly Unremarkable. Labs on Admission:  CBC:  Recent Labs Lab 07/28/13 2114  WBC 12.5*  HGB 14.7  HCT 41.9  MCV 89.3  PLT 223    CMP     Component Value Date/Time   NA 144 07/28/2013 1923   K 4.5 07/28/2013 1923   CL 104 07/28/2013 1923   CO2 24 07/28/2013 1923   GLUCOSE 252* 07/28/2013 1923   BUN 11 07/28/2013 1923   CREATININE 0.70 07/28/2013 1923   CALCIUM 8.8 07/28/2013 1923   PROT 7.5 07/28/2013 1923   ALBUMIN 4.1 07/28/2013 1923   AST 87* 07/28/2013 1923   ALT 50 07/28/2013 1923   ALKPHOS 64 07/28/2013 1923   BILITOT 0.7 07/28/2013 1923   GFRNONAA >90 07/28/2013 1923   GFRAA >90 07/28/2013 1923    No results found for this basename: LIPASE, AMYLASE,  in the last 168 hours No results found for  this basename: AMMONIA,  in the last 168 hours   Recent Labs Lab 07/28/13 1923 07/28/13 2247  TROPONINI <0.30 <0.30   BNP (last 3 results)  Recent Labs  07/28/13 2133  PROBNP 812.9*    Radiological Exams on Admission: Dg Chest Portable 1 View  07/28/2013   CLINICAL DATA:  Chest pain.  EXAM: PORTABLE CHEST - 1 VIEW  COMPARISON:  DG CHEST 2 VIEW dated 06/20/2013; DG CHEST 2 VIEW dated 01/03/2011; DG CHEST 2 VIEW dated 11/07/2008; CT ANGIO CHEST dated 05/08/2004  FINDINGS: Cardiac silhouette markedly enlarged but stable. Mild diffuse interstitial pulmonary edema. Markedly suboptimal inspiration due to body habitus with mild atelectasis in the lung bases.  IMPRESSION: 1. Mild CHF, with stable marked cardiomegaly and new mild diffuse interstitial pulmonary edema. 2. Suboptimal inspiration accounts for mild bibasilar atelectasis.   Electronically Signed   By: Evangeline Dakin M.D.   On: 07/28/2013 21:01   Dg Humerus Left  07/28/2013   CLINICAL DATA:  Left upper arm deformity  EXAM: LEFT HUMERUS - 2+ VIEW  COMPARISON:  None.  FINDINGS: There is a horizontal fracture through the midshaft left humerus with medial angulation of the distal fracture fragment. The distal fracture fragment is displaced 1 bone with with 15 mm override.  IMPRESSION: 1. Horizontal fracture through the midshaft left humerus with 1 bone width medial displacement of the distal fracture fragment.   Electronically Signed   By: Suzy Bouchard M.D.   On: 07/28/2013 19:15    EKG: Independently reviewed. normal EKG, normal sinus rhythm, nonspecific ST and T waves changes.  Assessment/Plan Principal Problem:   Chest pain Active Problems:   CIGARETTE SMOKER   GERD   DIABETES MELLITUS, BORDERLINE   Humerus fracture   1. Chest pain The patient is presenting with complaints of chest pain. His EKG is showing nonspecific ST-T changes but no acute signs of ischemia, initial troponin is negative. With this his chest x-ray isn't  suggestive of cardiomegaly with pulmonary vascular congestion. At present will be for observation, telemetry, troponins, echocardiogram. DuoNeb every 4 hours. Incentive spirometry. Repeat x-ray PA lateral.  2. Humerus fracture Orthopedics has been consulted and his arm has been wrapped in a sling Conservative management Dilaudid when necessary for pain  3. Borderline diabetes mellitus Check HbA1c and sliding scale Check TSH  4. Active smoker Nicotine patch  Consults: Orthopedics  DVT Prophylaxis: subcutaneous Heparin Nutrition: cardiac  Code Status: full  Family Communication: family was present at bedside, opportunity was given to ask question and all questions were answered satisfactorily at the time of interview. Disposition: Admitted to observation in telemetry unit.  Author: Berle Mull, MD [facility] Pager: [phone] 07/29/2013, 12:19 AM    If  7PM-7AM, please contact night-coverage [email or web address] Password [email or web address]

## 2013-07-29 NOTE — ED Provider Notes (Signed)
Medical screening examination/treatment/procedure(s) were performed by non-physician practitioner and as supervising physician I was immediately available for consultation/collaboration.      Sharyon Cable, MD 07/29/13 1014

## 2013-07-29 NOTE — Discharge Summary (Signed)
Physician Discharge Summary  [name] [id number] DOB: Jun 13, 1965 DOA: 07/28/2013  PCP: Noralee Space, MD  Admit date: 07/28/2013 Discharge date: 07/29/2013  Time spent: > 35  minutes  Recommendations for Outpatient Follow-up:  1. Patient will need to followup with cardiology and get cardiology clearance prior to planned operation 2. Will need to followup with orthopedic surgeon 3. He will need to monitor blood sugars at least 2 times a day once fasting and once postprandial.   Discharge Diagnoses:  Principal Problem:   Chest pain Active Problems:   CIGARETTE SMOKER   GERD   DIABETES MELLITUS, BORDERLINE   Humerus fracture   Cardiomyopathy   Discharge Condition: Stable  Diet recommendation: Carb modified diet  Filed Weights   07/28/13 1842  Weight: 119.75 kg (264 lb)    History of present illness:  48 year old with past medical history morbid obesity, GERD, IBS, active smoker who presented to the ED complaining of chest pain and shoulder pain.  Hospital Course:  Humerus fracture - Patient will need to followup with Dr. Sharol Given on discharge. - T2 cardiomyopathy with low ejection fraction patient will need to clearance from cardiology prior to operation.  Cardiomyopathy - Cardiac enzymes while in House were negative - Cardiology on board while patient was in house - Patient will need further evaluation and recommendations are for followup as outpatient 08/08/2013. Once cleared by cardiology patient will be able to have operation for humeral Fracture  Diabetes mellitus - New diagnosis - Provide prescription for glucose meter with lancets and strips - Discharge on metformin - Will recommend diabetic diet and will recommend routine monitoring of blood sugars  Procedures:  Echocardiogram with results as follows:  Left ventricle: The cavity size was severely dilated. Wall thickness was normal. Systolic function was severely reduced. The estimated ejection  fraction was in the range of 20% to 25%. Diffuse hypokinesis. Features are consistent with a pseudonormal left ventricular filling pattern, with concomitant abnormal relaxation and increased filling pressure (grade 2 diastolic dysfunction). - Mitral valve: Mild regurgitation. - Left atrium: The atrium was moderately dilated. - Right atrium: The atrium was mildly dilated. - Pericardium, extracardiac: A small pericardial effusion was identified.   Consultations:  Cardiology  Discharge Exam: Filed Vitals:   07/29/13 1300  BP: 164/88  Pulse: 92  Temp: 98.1 F (36.7 C)  Resp: 20    General: Pt in NAD, alert and awake Cardiovascular: RRR,no mrg Respiratory: CTA BL, no wheezes  Discharge Instructions  Discharge Orders   Future Appointments Provider Department Dept Phone   08/08/2013 3:30 PM Rogelia Mire, NP [place] Office [phone]   Future Orders Complete By Expires   Call MD for:  severe uncontrolled pain  As directed    Diet - low sodium heart healthy  As directed    Discharge instructions  As directed    Comments:     Follow up with Dr. Carrolyn Leigh next week for further evaluation/treatment of left arm. Follow up with PCP within the next two weeks for evaluation of blood pressure and blood sugar. HbA1C-10.8 Follow up with cardiology for outpatient treadmill stress test once humerus fracture stabilized.   Increase activity slowly  As directed        Medication List    STOP taking these medications       ibuprofen 200 MG tablet  Commonly known as:  ADVIL,MOTRIN      TAKE these medications       aspirin 81 MG EC tablet  Take 1 tablet (81 mg total) by mouth daily.  Start taking on:  07/30/2013     carvedilol 3.125 MG tablet  Commonly known as:  COREG  Take 1 tablet (3.125 mg total) by mouth 2 (two) times daily with a meal.     lisinopril 2.5 MG tablet  Commonly known as:  PRINIVIL,ZESTRIL  Take 1 tablet (2.5 mg total) by mouth daily.      metFORMIN 500 MG tablet  Commonly known as:  GLUCOPHAGE  Take 1 tablet (500 mg total) by mouth 2 (two) times daily with a meal.     oxyCODONE-acetaminophen 5-325 MG per tablet  Commonly known as:  PERCOCET/ROXICET  Take 2 tablets by mouth once.       No Known Allergies     Follow-up Information   Follow up with Murray Hodgkins, NP On 08/08/2013. (3:30 PM - Dr. Jacalyn Lefevre Nurse Practitioner.)    Specialty:  Nurse Practitioner   Contact information:   [id number] [place] [address] [phone]        The results of significant diagnostics from this hospitalization (including imaging, microbiology, ancillary and laboratory) are listed below for reference.    Significant Diagnostic Studies: Dg Chest 1 View  07/29/2013   CLINICAL DATA:  Left humeral pain, short of breath  EXAM: CHEST - 1 VIEW  COMPARISON:  DG CHEST 1V PORT dated 07/28/2013  FINDINGS: Stable enlarged cardiac silhouette. There is improvement in the pulmonary edema pattern seen on comparison exam. Mild central venous pulmonary congestion remains.  IMPRESSION: Improvement in pulmonary edema pattern.  Stable cardiomegaly.   Electronically Signed   By: Suzy Bouchard M.D.   On: 07/29/2013 01:46   Dg Chest Portable 1 View  07/28/2013   CLINICAL DATA:  Chest pain.  EXAM: PORTABLE CHEST - 1 VIEW  COMPARISON:  DG CHEST 2 VIEW dated 06/20/2013; DG CHEST 2 VIEW dated 01/03/2011; DG CHEST 2 VIEW dated 11/07/2008; CT ANGIO CHEST dated 05/08/2004  FINDINGS: Cardiac silhouette markedly enlarged but stable. Mild diffuse interstitial pulmonary edema. Markedly suboptimal inspiration due to body habitus with mild atelectasis in the lung bases.  IMPRESSION: 1. Mild CHF, with stable marked cardiomegaly and new mild diffuse interstitial pulmonary edema. 2. Suboptimal inspiration accounts for mild bibasilar atelectasis.   Electronically Signed   By: Evangeline Dakin M.D.   On: 07/28/2013 21:01   Dg Humerus Left  07/28/2013    CLINICAL DATA:  Left upper arm deformity  EXAM: LEFT HUMERUS - 2+ VIEW  COMPARISON:  None.  FINDINGS: There is a horizontal fracture through the midshaft left humerus with medial angulation of the distal fracture fragment. The distal fracture fragment is displaced 1 bone with with 15 mm override.  IMPRESSION: 1. Horizontal fracture through the midshaft left humerus with 1 bone width medial displacement of the distal fracture fragment.   Electronically Signed   By: Suzy Bouchard M.D.   On: 07/28/2013 19:15    Microbiology: No results found for this or any previous visit (from the past 240 hour(s)).   Labs: Basic Metabolic Panel:  Recent Labs Lab 07/28/13 1923 07/29/13 0402  NA 144 142  K 4.5 4.2  CL 104 104  CO2 24 25  GLUCOSE 252* 279*  BUN 11 10  CREATININE 0.70 0.78  CALCIUM 8.8 9.2   Liver Function Tests:  Recent Labs Lab 07/28/13 1923 07/29/13 0402  AST 87* 52*  ALT 50 55*  ALKPHOS 64 68  BILITOT 0.7 0.5  PROT  7.5 7.4  ALBUMIN 4.1 3.8   No results found for this basename: LIPASE, AMYLASE,  in the last 168 hours No results found for this basename: AMMONIA,  in the last 168 hours CBC:  Recent Labs Lab 07/28/13 2114 07/29/13 0402  WBC 12.5* 10.0  NEUTROABS  --  7.1  HGB 14.7 14.4  HCT 41.9 41.3  MCV 89.3 89.8  PLT 223 245   Cardiac Enzymes:  Recent Labs Lab 07/28/13 1923 07/28/13 2247 07/29/13 0402 07/29/13 1035  TROPONINI <0.30 <0.30 <0.30 <0.30   BNP: BNP (last 3 results)  Recent Labs  07/28/13 2133  PROBNP 812.9*   CBG:  Recent Labs Lab 07/28/13 2315 07/29/13 0747 07/29/13 1127  GLUCAP 213* 191* 302*       Signed:  Velvet Bathe  [facility] 07/29/2013, 4:39 PM

## 2013-07-29 NOTE — Progress Notes (Signed)
  Echocardiogram 2D Echocardiogram has been performed.  Mauricio Po 07/29/2013, 2:33 PM

## 2013-07-29 NOTE — Discharge Instructions (Signed)
Diets for Diabetes, Food Labeling Look at food labels to help you decide how much of a product you can eat. You will want to check the amount of total carbohydrate in a serving to see how the food fits into your meal plan. In the list of ingredients, the ingredient present in the largest amount by weight must be listed first, followed by the other ingredients in descending order. STANDARD OF IDENTITY Most products have a list of ingredients. However, foods that the Food and Drug Administration (FDA) has given a standard of identity do not need a list of ingredients. A standard of identity means that a food must contain certain ingredients if it is called a particular name. Examples are mayonnaise, peanut butter, ketchup, jelly, and cheese. LABELING TERMS There are many terms found on food labels. Some of these terms have specific definitions. Some terms are regulated by the FDA, and the FDA has clearly specified how they can be used. Others are not regulated or well-defined and can be misleading and confusing. SPECIFICALLY DEFINED TERMS Nutritive Sweetener.  A sweetener that contains calories,such as table sugar or honey. Nonnutritive Sweetener.  A sweetener with few or no calories,such as saccharin, aspartame, sucralose, and cyclamate. LABELING TERMS REGULATED BY THE FDA Free.  The product contains only a tiny or small amount of fat, cholesterol, sodium, sugar, or calories. For example, a "fat-free" product will contain less than 0.5 g of fat per serving. Low.  A food described as "low" in fat, saturated fat, cholesterol, sodium, or calories could be eaten fairly often without exceeding dietary guidelines. For example, "low in fat" means no more than 3 g of fat per serving. Lean.  "Lean" and "extra lean" are U.S. Department of Agriculture Scientist, research (physical sciences)) terms for use on meat and poultry products. "Lean" means the product contains less than 10 g of fat, 4 g of saturated fat, and 95 mg of cholesterol  per serving. "Lean" is not as low in fat as a product labeled "low." Extra Lean.  "Extra lean" means the product contains less than 5 g of fat, 2 g of saturated fat, and 95 mg of cholesterol per serving. While "extra lean" has less fat than "lean," it is still higher in fat than a product labeled "low." Reduced, Less, Fewer.  A diet product that contains 25% less of a nutrient or calories than the regular version. For example, hot dogs might be labeled "25% less fat than our regular hot dogs." Light/Lite.  A diet product that contains  fewer calories or  the fat of the original. For example, "light in sodium" means a product with  the usual sodium. More.  One serving contains at least 10% more of the daily value of a vitamin, mineral, or fiber than usual. Good Source Of.  One serving contains 10% to 19% of the daily value for a particular vitamin, mineral, or fiber. Excellent Source Of.  One serving contains 20% or more of the daily value for a particular nutrient. Other terms used might be "high in" or "rich in." Enriched or Fortified.  The product contains added vitamins, minerals, or protein. Nutrition labeling must be used on enriched or fortified foods. Imitation.  The product has been altered so that it is lower in protein, vitamins, or minerals than the usual food,such as imitation peanut butter. Total Fat.  The number listed is the total of all fat found in a serving of the product. Under total fat, food labels must list saturated fat and  trans fat, which are associated with raising bad cholesterol and an increased risk of heart blood vessel disease. Saturated Fat.  Mainly fats from animal-based sources. Some examples are red meat, cheese, cream, whole milk, and coconut oil. Trans Fat.  Found in some fried snack foods, packaged foods, and fried restaurant foods. It is recommended you eat as close to 0 g of trans fat as possible, since it raises bad cholesterol and lowers  good cholesterol. Polyunsaturated and Monounsaturated Fats.  More healthful fats. These fats are from plant sources. Total Carbohydrate.  The number of carbohydrate grams in a serving of the product. Under total carbohydrate are listed the other carbohydrate sources, such as dietary fiber and sugars. Dietary Fiber.  A carbohydrate from plant sources. Sugars.  Sugars listed on the label contain all naturally occurring sugars as well as added sugars. LABELING TERMS NOT REGULATED BY THE FDA Sugarless.  Table sugar (sucrose) has not been added. However, the manufacturer may use another form of sugar in place of sucrose to sweeten the product. For example, sugar alcohols are used to sweeten foods. Sugar alcohols are a form of sugar but are not table sugar. If a product contains sugar alcohols in place of sucrose, it can still be labeled "sugarless." Low Salt, Salt-Free, Unsalted, No Salt, No Salt Added, Without Added Salt.  Food that is usually processed with salt has been made without salt. However, the food may contain sodium-containing additives, such as preservatives, leavening agents, or flavorings. Natural.  This term has no legal meaning. Organic.  Foods that are certified as organic have been inspected and approved by the USDA to ensure they are produced without pesticides, fertilizers containing synthetic ingredients, bioengineering, or ionizing radiation. Document Released: 05/29/2003 Document Revised: 08/18/2011 Document Reviewed: 12/14/2008 [facility] Patient Information 2014 [place], [place].  Diabetes Meal Planning Guide The diabetes meal planning guide is a tool to help you plan your meals and snacks. It is important for people with diabetes to manage their blood glucose (sugar) levels. Choosing the right foods and the right amounts throughout your day will help control your blood glucose. Eating right can even help you improve your blood pressure and reach or maintain a healthy  weight. CARBOHYDRATE COUNTING MADE EASY When you eat carbohydrates, they turn to sugar. This raises your blood glucose level. Counting carbohydrates can help you control this level so you feel better. When you plan your meals by counting carbohydrates, you can have more flexibility in what you eat and balance your medicine with your food intake. Carbohydrate counting simply means adding up the total amount of carbohydrate grams in your meals and snacks. Try to eat about the same amount at each meal. Foods with carbohydrates are listed below. Each portion below is 1 carbohydrate serving or 15 grams of carbohydrates. Ask your dietician how many grams of carbohydrates you should eat at each meal or snack. Grains and Starches  1 slice bread.   English muffin or hotdog/hamburger bun.   cup cold cereal (unsweetened).   cup cooked pasta or rice.   cup starchy vegetables (corn, potatoes, peas, beans, winter squash).  1 tortilla (6 inches).   bagel.  1 waffle or pancake (size of a CD).   cup cooked cereal.  4 to 6 small crackers. *Whole grain is recommended. Fruit  1 cup fresh unsweetened berries, melon, papaya, pineapple.  1 small fresh fruit.   banana or mango.   cup fruit juice (4 oz unsweetened).   cup canned fruit in natural  juice or water.  2 tbs dried fruit.  12 to 15 grapes or cherries. Milk and Yogurt  1 cup fat-free or 1% milk.  1 cup soy milk.  6 oz light yogurt with sugar-free sweetener.  6 oz low-fat soy yogurt.  6 oz plain yogurt. Vegetables  1 cup raw or  cup cooked is counted as 0 carbohydrates or a "free" food.  If you eat 3 or more servings at 1 meal, count them as 1 carbohydrate serving. Other Carbohydrates   oz chips or pretzels.   cup ice cream or frozen yogurt.   cup sherbet or sorbet.  2 inch square cake, no frosting.  1 tbs honey, sugar, jam, jelly, or syrup.  2 small cookies.  3 squares of graham crackers.  3 cups  popcorn.  6 crackers.  1 cup broth-based soup.  Count 1 cup casserole or other mixed foods as 2 carbohydrate servings.  Foods with less than 20 calories in a serving may be counted as 0 carbohydrates or a "free" food. You may want to purchase a book or computer software that lists the carbohydrate gram counts of different foods. In addition, the nutrition facts panel on the labels of the foods you eat are a good source of this information. The label will tell you how big the serving size is and the total number of carbohydrate grams you will be eating per serving. Divide this number by 15 to obtain the number of carbohydrate servings in a portion. Remember, 1 carbohydrate serving equals 15 grams of carbohydrate. SERVING SIZES Measuring foods and serving sizes helps you make sure you are getting the right amount of food. The list below tells how big or small some common serving sizes are.  1 oz.........4 stacked dice.  3 oz........Marland KitchenDeck of cards.  1 tsp.......Marland KitchenTip of little finger.  1 tbs......Marland KitchenMarland KitchenThumb.  2 tbs.......Marland KitchenGolf ball.   cup......Marland KitchenHalf of a fist.  1 cup.......Marland KitchenA fist. SAMPLE DIABETES MEAL PLAN Below is a sample meal plan that includes foods from the grain and starches, dairy, vegetable, fruit, and meat groups. A dietician can individualize a meal plan to fit your calorie needs and tell you the number of servings needed from each food group. However, controlling the total amount of carbohydrates in your meal or snack is more important than making sure you include all of the food groups at every meal. You may interchange carbohydrate containing foods (dairy, starches, and fruits). The meal plan below is an example of a 2000 calorie diet using carbohydrate counting. This meal plan has 17 carbohydrate servings. Breakfast  1 cup oatmeal (2 carb servings).   cup light yogurt (1 carb serving).  1 cup blueberries (1 carb serving).   cup almonds. Snack  1 large apple (2 carb  servings).  1 low-fat string cheese stick. Lunch  Chicken breast salad.  1 cup spinach.   cup chopped tomatoes.  2 oz chicken breast, sliced.  2 tbs low-fat [place] dressing.  12 whole-wheat crackers (2 carb servings).  12 to 15 grapes (1 carb serving).  1 cup low-fat milk (1 carb serving). Snack  1 cup carrots.   cup hummus (1 carb serving). Dinner  3 oz broiled salmon.  1 cup brown rice (3 carb servings). Snack  1  cups steamed broccoli (1 carb serving) drizzled with 1 tsp olive oil and lemon juice.  1 cup light pudding (2 carb servings). DIABETES MEAL PLANNING WORKSHEET Your dietician can use this worksheet to help you decide how many servings  of foods and what types of foods are right for you.  BREAKFAST Food Group and Servings / Carb Servings Grain/Starches __________________________________ Dairy __________________________________________ Vegetable ______________________________________ Fruit ___________________________________________ Meat __________________________________________ Fat ____________________________________________ LUNCH Food Group and Servings / Carb Servings Grain/Starches ___________________________________ Dairy ___________________________________________ Fruit ____________________________________________ Meat ___________________________________________ Fat _____________________________________________ [name] Food Group and Servings / Carb Servings Grain/Starches ___________________________________ Dairy ___________________________________________ Fruit ____________________________________________ Meat ___________________________________________ Fat _____________________________________________ SNACKS Food Group and Servings / Carb Servings Grain/Starches ___________________________________ Dairy ___________________________________________ Vegetable _______________________________________ Fruit  ____________________________________________ Meat ___________________________________________ Fat _____________________________________________ DAILY TOTALS Starches _________________________ Vegetable ________________________ Fruit ____________________________ Dairy ____________________________ Meat ____________________________ Fat ______________________________ Document Released: 02/20/2005 Document Revised: 08/18/2011 Document Reviewed: 01/01/2009 [employer] Patient Information 2014 [place], [place].  Daily Diabetes Record Week of _____________________________ Date: _________  [name], BG/Medications: ________________ / __________________________________________________________  LUNCH, BG/Medications: ____________________ / __________________________________________________________  [name], BG/Medications: ___________________ / __________________________________________________________  BEDTIME, BG/Medications: __________________ / __________________________________________________________ Date: _________  [name], BG/Medications: ________________ / __________________________________________________________  LUNCH, BG/Medications: ____________________ / __________________________________________________________  [name], BG/Medications: ___________________ / __________________________________________________________  BEDTIME, BG/Medications: __________________ / __________________________________________________________ Date: _________  [name], BG/Medications: ________________ / __________________________________________________________  LUNCH, BG/Medications: ____________________ / __________________________________________________________  [name], BG/Medications: ___________________ / __________________________________________________________  BEDTIME, BG/Medications: __________________ / __________________________________________________________ Date:  _________  [name], BG/Medications: ________________ / __________________________________________________________  LUNCH, BG/Medications: ____________________ / __________________________________________________________  [name], BG/Medications: ___________________ / __________________________________________________________  BEDTIME, BG/Medications: __________________ / __________________________________________________________ Date: _________  [name], BG/Medications: ________________ / __________________________________________________________  LUNCH, BG/Medications: ____________________ / __________________________________________________________  [name], BG/Medications: ___________________ / __________________________________________________________  BEDTIME, BG/Medications: __________________ / __________________________________________________________ Date: _________  [name], BG/Medications: ________________ / __________________________________________________________  LUNCH, BG/Medications: ____________________ / __________________________________________________________  [name], BG/Medications: ___________________ / __________________________________________________________  BEDTIME, BG/Medications: __________________ / __________________________________________________________ Date: _________  [name], BG/Medications: ________________ / __________________________________________________________  LUNCH, BG/Medications: ____________________ / __________________________________________________________  [name], BG/Medications: ___________________ / __________________________________________________________  BEDTIME, BG/Medications: __________________ / __________________________________________________________ Notes: __________________________________________________________________________________________________ Document Released: 04/29/2004 Document Revised:  08/18/2011 Document Reviewed: 02/21/2009 [employer] Patient Information 2014 [place], [place].  Monitoring for Diabetes There are two blood tests that help you monitor and manage your diabetes. These include:  An A1c (hemoglobin A1c) test.  This test is an average of your glucose (or blood sugar) control over the past 3 months.  This is recommended as a way for you and your caregiver to understand how well your glucose levels are controlled on the average.  Your A1c goal will be determined by your caregiver, but it is usually best if it is less than 6.5% to 7.0%.  Glucose (sugar) attaches itself to red blood cells. The amount of glucose then can then be measured. The amount of glucose on the cells depends on how high your blood glucose has been.  SMBG test (self-monitoring blood glucose).  Using a blood glucose monitor (meter) to do SMBG testing is an easy way to monitor the amount of glucose in your blood and can help you improve your control. The monitor will tell you what your blood glucose is at that very moment. Every person with diabetes should have a blood glucose monitor and know how to use it. The better you control your blood sugar on a daily basis, the better your A1c levels will be. HOW OFTEN SHOULD I HAVE AN A1C LEVEL?  Every 3 months if your diabetes is not well controlled or if therapy has changed.  Every 6 months if you are meeting your treatment goals. HOW OFTEN SHOULD I DO SMBG TESTING?  Your caregiver will recommend how often you should test. Testing times are based on the kind of medicine you take, type of  diabetes you have, and your blood glucose control. Testing times can include:  Type 1 diabetes: test 3 or 4 times a day or as directed.  Type 2 diabetes and if you are taking insulin and diabetes pills: test 3 or 4 times a day or as directed.  If you are taking diabetes pills only and not reaching your target A1c: test 2 to 4 times a day or as directed.  If you are  taking diabetes pills and are controlling your diabetes well with diet and exercise, your caregiver will help you decide what is appropriate. WHAT TIME OF DAY SHOULD I TEST?  The best time of day to test your blood glucose depends on medications, mealtimes, exercise, and blood glucose control. It is best to test at different times because this will help you know how you are doing throughout the day. Your caregiver will help you decide what is best. WHAT SHOULD MY BLOOD GLUCOSE BE? Blood glucose target goals may vary depending on each persons needs, whether they have type 1 or type 2 diabetes or what medications they are taking. However, as a general rule, blood glucose should be:  Before meals   70-130 mg/dl.  After meals    ..less than 180 mg/dl. CHECK YOUR BLOOD GLUCOSE IF:  You have symptoms of low blood sugar (hypoglycemia), which may include dizziness, shaking, sweating, chills and confusion.  You have symptoms of high blood sugar (hyperglycemia), which may include sleepiness, blurred vision, frequent urination and excessive thirst.  You are learning how meals, physical activity and medicine affect your blood glucose level. The more you learn about how various foods, your medications, and activities affect you, the better job you will do of taking care of yourself.  You have a job in which poor control could cause safety problems while driving or operating machinery. CHECK YOUR BLOOD SUGAR MORE FREQUENTLY:  If you have medication or dietary changes.  If you begin taking other kinds of medicines.  If you become sick or your level of stress increases. With an illness, your blood sugar may even be high without eating.  Before and after exercise. Follow your caregiver's testing recommendations during this time.  TO DISPOSE OF SHARPS: Each city or state may have different regulations. Check with your public works or Theatre manager.  Sharps containers can be purchased from  pharmacies.  Place all used sharps in a container. You do not need to replace any protective covers over the needle or break the needle.  Sharps should be contained in a ridge, leakproof, puncture-resistant container.  Plastic detergent bottle.  Bleach bottle.  When container is almost full, add a solution that is 1 part laundry bleach and 9 parts tap water (it is okay to use undiluted bleach if you wish). You may want to wear gloves since bleach can damage tissue. Let the solution sit for 30 minutes.  Carefully pour all the liquid into the sanitary sewer. Be sure to prevent the sharps from falling out.  Once liquid is drained, reseal the container with lid and tape it shut with duct tape. This will prevent the cap from coming off.  Dispose of the container with your regular household trash and waste. It is a good idea to let your trash hauler know that you will be disposing of sharps. Document Released: 05/29/2003 Document Revised: 02/18/2012 Document Reviewed: 11/27/2008 [facility] Patient Information 2014 [place], [place].

## 2013-07-29 NOTE — Progress Notes (Signed)
Echocardiogram performed with results as below.  Study Conclusions  - Left ventricle: The cavity size was severely dilated. Wall thickness was normal. Systolic function was severely reduced. The estimated ejection fraction was in the range of 20% to 25%. Diffuse hypokinesis. Features are consistent with a pseudonormal left ventricular filling pattern, with concomitant abnormal relaxation and increased filling pressure (grade 2 diastolic dysfunction). - Mitral valve: Mild regurgitation. - Left atrium: The atrium was moderately dilated. - Right atrium: The atrium was mildly dilated. - Pericardium, extracardiac: A small pericardial effusion was identified.  Results discussed with [name] and Mr. Angela Adam.  I provided education regarding the diagnosis of cardiomyopathy, medical management, daily weights, sodium restriction, and symptom reporting.  We discussed further workup in detail to include early followup on March 2, with plan for diagnostic catheterization to follow. Beta blocker and ACE inhibitor therapy have been started today. We will followup a basic metabolic profile when seen March 2. All questions answered.  Murray Hodgkins, NP 07/29/2013 4:14 PM

## 2013-07-29 NOTE — Progress Notes (Addendum)
Inpatient Diabetes Program Recommendations  AACE/ADA: New Consensus Statement on Inpatient Glycemic Control (2013)  Target Ranges:  Prepandial:   less than 140 mg/dL      Peak postprandial:   less than 180 mg/dL (1-2 hours)      Critically ill patients:  140 - 180 mg/dL   Reason for Visit: Results for ORVALL, SHINABARGER (MRN [id number]) as of 07/29/2013 13:48  Ref. Range 07/28/2013 23:15 07/29/2013 07:47 07/29/2013 11:27  Glucose-Capillary Latest Range: 70-99 mg/dL 213 (H) 191 (H) 302 (H)  Results for MALACKI, VENIER (MRN [id number]) as of 07/29/2013 13:48  Ref. Range 07/29/2013 04:02  Hemoglobin A1C Latest Range: <5.7 % 10.8 (H)   Diabetes history: H&P notes history of borderline diabetes-However A1C indicates average CBG's of 264 mg/dL.  Outpatient Diabetes medications: None Current orders for Inpatient glycemic control: Novolog sensitive tid with meals and HS.  Needs to follow-up with PCP regarding elevated A1C and likely needs to start medications for diabetes.   Thanks, Adah Perl, RN, BC-ADM Inpatient Diabetes Coordinator Pager [phone]

## 2013-07-29 NOTE — Consult Note (Signed)
HPI: 48 year old male for evaluation of chest pain. No prior cardiac history. Patient has dyspnea with more extreme activities but not routine activities. No orthopnea, PND, pedal edema, exertional chest pain or syncope. The patient fell yesterday and fractured his left humerus. In the emergency room he experienced chest tightness. This was substernal in location without radiation. It lasted 5 minutes and resolved. There was associated dyspnea. He also had nausea but attributes that to pain medication. No diaphoresis. He has not had further pain. Cardiology is asked to evaluate. Note he states he can walk unlimited amounts with no chest pain.  Medications Prior to Admission  Medication Sig Dispense Refill  . ibuprofen (ADVIL,MOTRIN) 200 MG tablet Take 400-600 mg by mouth every 6 (six) hours as needed for mild pain or moderate pain.      Marland Kitchen oxyCODONE-acetaminophen (PERCOCET/ROXICET) 5-325 MG per tablet Take 1 tablet by mouth every 8 (eight) hours as needed for severe pain.  11 tablet  0    No Known Allergies  Past Medical History  Diagnosis Date  . Hoarseness   . Cigarette smoker   . Diabetes mellitus   . Obesity   . 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 GERD (gastroesophageal reflux disease)   . IBS (irritable bowel syndrome)   . Anxiety   . History of renal calculi   . Atypical chest pain     Past Surgical History  Procedure Laterality Date  . Cholecystectomy      History   Social History  . Marital Status: Single    Spouse Name: N/A    Number of Children: N/A  . Years of Education: N/A   Occupational History  . Not on file.   Social History Main Topics  . Smoking status: Current Every Day Smoker -- 1.00 packs/day    Types: Cigarettes  . Smokeless tobacco: Not on file  . Alcohol Use: Yes     Comment: social  . Drug Use: No  . Sexual Activity: Not on file   Other Topics Concern  . Not on file   Social  History Narrative  . No narrative on file    Family History  Problem Relation Age of Onset  . Diabetes Father   . Arthritis Father   . Diabetes Mother   . Arthritis Mother   . Hypertension Brother     ROS:  Pain in left arm following fracture but no fevers or chills, productive cough, hemoptysis, dysphasia, odynophagia, melena, hematochezia, dysuria, hematuria, rash, seizure activity, orthopnea, PND, pedal edema, claudication. Remaining systems are negative.  Physical Exam:   Blood pressure 122/81, pulse 104, temperature 98.9 F (37.2 C), temperature source Oral, resp. rate 20, height 6' 1"  (1.854 m), weight 264 lb (119.75 kg), SpO2 96.00%.  General:  Well developed/well nourished in NAD Skin warm/dry Patient not depressed No peripheral clubbing Back-normal HEENT-normal/normal eyelids Neck supple/normal carotid upstroke bilaterally; no bruits; no JVD; no thyromegaly chest - CTA/ normal expansion CV - RRR/normal S1 and S2; no murmurs, rubs or gallops;  PMI nondisplaced Abdomen -NT/ND, no HSM, no mass, + bowel sounds, no bruit 2+ femoral pulses, no bruits Ext-no edema, chords, 2+ DP, left arm in brace Neuro-grossly nonfocal  ECG sinus rhythm with no ST changes  Results for orders placed during the hospital encounter of 07/28/13 (from the past 48 hour(s))  COMPREHENSIVE METABOLIC PANEL     Status: Abnormal   Collection Time    07/28/13  7:23 PM      Result Value Ref Range   Sodium 144  137 - 147 mEq/L   Potassium 4.5  3.7 - 5.3 mEq/L   Comment: HEMOLYSIS AT THIS LEVEL MAY AFFECT RESULT   Chloride 104  96 - 112 mEq/L   CO2 24  19 - 32 mEq/L   Glucose, Bld 252 (*) 70 - 99 mg/dL   BUN 11  6 - 23 mg/dL   Creatinine, Ser 0.70  0.50 - 1.35 mg/dL   Calcium 8.8  8.4 - 10.5 mg/dL   Total Protein 7.5  6.0 - 8.3 g/dL   Albumin 4.1  3.5 - 5.2 g/dL   AST 87 (*) 0 - 37 U/L   Comment: HEMOLYSIS AT THIS LEVEL MAY AFFECT RESULT   ALT 50  0 - 53 U/L   Comment: HEMOLYSIS AT THIS  LEVEL MAY AFFECT RESULT   Alkaline Phosphatase 64  39 - 117 U/L   Total Bilirubin 0.7  0.3 - 1.2 mg/dL   GFR calc non Af Amer >90  >90 mL/min   GFR calc Af Amer >90  >90 mL/min   Comment: (NOTE)     The eGFR has been calculated using the CKD EPI equation.     This calculation has not been validated in all clinical situations.     eGFR's persistently <90 mL/min signify possible Chronic Kidney     Disease.  TROPONIN I     Status: None   Collection Time    07/28/13  7:23 PM      Result Value Ref Range   Troponin I <0.30  <0.30 ng/mL   Comment:            Due to the release kinetics of cTnI,     a negative result within the first hours     of the onset of symptoms does not rule out     myocardial infarction with certainty.     If myocardial infarction is still suspected,     repeat the test at appropriate intervals.  CBC     Status: Abnormal   Collection Time    07/28/13  9:14 PM      Result Value Ref Range   WBC 12.5 (*) 4.0 - 10.5 K/uL   RBC 4.69  4.22 - 5.81 MIL/uL   Hemoglobin 14.7  13.0 - 17.0 g/dL   HCT 41.9  39.0 - 52.0 %   MCV 89.3  78.0 - 100.0 fL   MCH 31.3  26.0 - 34.0 pg   MCHC 35.1  30.0 - 36.0 g/dL   RDW 13.7  11.5 - 15.5 %   Platelets 223  150 - 400 K/uL  PRO B NATRIURETIC PEPTIDE     Status: Abnormal   Collection Time    07/28/13  9:33 PM      Result Value Ref Range   Pro B Natriuretic peptide (BNP) 812.9 (*) 0 - 125 pg/mL  TROPONIN I     Status: None   Collection Time    07/28/13 10:47 PM      Result Value Ref Range   Troponin I <0.30  <0.30 ng/mL   Comment:            Due to the release kinetics of cTnI,     a negative result within the first hours     of the onset of symptoms does not rule out     myocardial infarction with certainty.     If  myocardial infarction is still suspected,     repeat the test at appropriate intervals.  GLUCOSE, CAPILLARY     Status: Abnormal   Collection Time    07/28/13 11:15 PM      Result Value Ref Range    Glucose-Capillary 213 (*) 70 - 99 mg/dL   Comment 1 Notify RN    TROPONIN I     Status: None   Collection Time    07/29/13  4:02 AM      Result Value Ref Range   Troponin I <0.30  <0.30 ng/mL   Comment:            Due to the release kinetics of cTnI,     a negative result within the first hours     of the onset of symptoms does not rule out     myocardial infarction with certainty.     If myocardial infarction is still suspected,     repeat the test at appropriate intervals.  CBC WITH DIFFERENTIAL     Status: None   Collection Time    07/29/13  4:02 AM      Result Value Ref Range   WBC 10.0  4.0 - 10.5 K/uL   RBC 4.60  4.22 - 5.81 MIL/uL   Hemoglobin 14.4  13.0 - 17.0 g/dL   HCT 41.3  39.0 - 52.0 %   MCV 89.8  78.0 - 100.0 fL   MCH 31.3  26.0 - 34.0 pg   MCHC 34.9  30.0 - 36.0 g/dL   RDW 14.0  11.5 - 15.5 %   Platelets 245  150 - 400 K/uL   Neutrophils Relative % 71  43 - 77 %   Neutro Abs 7.1  1.7 - 7.7 K/uL   Lymphocytes Relative 21  12 - 46 %   Lymphs Abs 2.1  0.7 - 4.0 K/uL   Monocytes Relative 8  3 - 12 %   Monocytes Absolute 0.8  0.1 - 1.0 K/uL   Eosinophils Relative 0  0 - 5 %   Eosinophils Absolute 0.0  0.0 - 0.7 K/uL   Basophils Relative 0  0 - 1 %   Basophils Absolute 0.0  0.0 - 0.1 K/uL  COMPREHENSIVE METABOLIC PANEL     Status: Abnormal   Collection Time    07/29/13  4:02 AM      Result Value Ref Range   Sodium 142  137 - 147 mEq/L   Potassium 4.2  3.7 - 5.3 mEq/L   Chloride 104  96 - 112 mEq/L   CO2 25  19 - 32 mEq/L   Glucose, Bld 279 (*) 70 - 99 mg/dL   BUN 10  6 - 23 mg/dL   Creatinine, Ser 0.78  0.50 - 1.35 mg/dL   Calcium 9.2  8.4 - 10.5 mg/dL   Total Protein 7.4  6.0 - 8.3 g/dL   Albumin 3.8  3.5 - 5.2 g/dL   AST 52 (*) 0 - 37 U/L   ALT 55 (*) 0 - 53 U/L   Alkaline Phosphatase 68  39 - 117 U/L   Total Bilirubin 0.5  0.3 - 1.2 mg/dL   GFR calc non Af Amer >90  >90 mL/min   GFR calc Af Amer >90  >90 mL/min   Comment: (NOTE)     The eGFR has  been calculated using the CKD EPI equation.     This calculation has not been validated in all clinical situations.  eGFR's persistently <90 mL/min signify possible Chronic Kidney     Disease.  PROTIME-INR     Status: None   Collection Time    07/29/13  4:02 AM      Result Value Ref Range   Prothrombin Time 13.8  11.6 - 15.2 seconds   INR 1.08  0.00 - 1.49  GLUCOSE, CAPILLARY     Status: Abnormal   Collection Time    07/29/13  7:47 AM      Result Value Ref Range   Glucose-Capillary 191 (*) 70 - 99 mg/dL    Dg Chest 1 View  07/29/2013   CLINICAL DATA:  Left humeral pain, short of breath  EXAM: CHEST - 1 VIEW  COMPARISON:  DG CHEST 1V PORT dated 07/28/2013  FINDINGS: Stable enlarged cardiac silhouette. There is improvement in the pulmonary edema pattern seen on comparison exam. Mild central venous pulmonary congestion remains.  IMPRESSION: Improvement in pulmonary edema pattern.  Stable cardiomegaly.   Electronically Signed   By: Suzy Bouchard M.D.   On: 07/29/2013 01:46   Dg Chest Portable 1 View  07/28/2013   CLINICAL DATA:  Chest pain.  EXAM: PORTABLE CHEST - 1 VIEW  COMPARISON:  DG CHEST 2 VIEW dated 06/20/2013; DG CHEST 2 VIEW dated 01/03/2011; DG CHEST 2 VIEW dated 11/07/2008; CT ANGIO CHEST dated 05/08/2004  FINDINGS: Cardiac silhouette markedly enlarged but stable. Mild diffuse interstitial pulmonary edema. Markedly suboptimal inspiration due to body habitus with mild atelectasis in the lung bases.  IMPRESSION: 1. Mild CHF, with stable marked cardiomegaly and new mild diffuse interstitial pulmonary edema. 2. Suboptimal inspiration accounts for mild bibasilar atelectasis.   Electronically Signed   By: Evangeline Dakin M.D.   On: 07/28/2013 21:01   Dg Humerus Left  07/28/2013   CLINICAL DATA:  Left upper arm deformity  EXAM: LEFT HUMERUS - 2+ VIEW  COMPARISON:  None.  FINDINGS: There is a horizontal fracture through the midshaft left humerus with medial angulation of the distal fracture  fragment. The distal fracture fragment is displaced 1 bone with with 15 mm override.  IMPRESSION: 1. Horizontal fracture through the midshaft left humerus with 1 bone width medial displacement of the distal fracture fragment.   Electronically Signed   By: Suzy Bouchard M.D.   On: 07/28/2013 19:15    Assessment/Plan 1 chest pain-symptoms are atypical and brief. Prior to his fall he has had no exertional chest pain. Electrocardiogram shows no ST changes. Enzymes negative. Would allow patient to recover from humerus fracture and then proceed with exercise treadmill. 2 cardiomegaly-noted on chest x-ray. Await echocardiogram. 3 fractured humerus-management per primary care and orthopedics. If he requires surgical intervention I think he can proceed if LV function normal on echocardiogram. 4 history of borderline diabetes mellitus-management per primary care If left ventricular function is normal on echocardiogram patient can be discharged from a cardiac standpoint with outpatient exercise treadmill.   [name] MD 07/29/2013, 8:02 AM

## 2013-07-30 MED ORDER — OXYCODONE HCL 5 MG PO TABS: 5.0000 mg | ORAL_TABLET | Freq: Four times a day (QID) | ORAL | Status: DC | PRN

## 2013-07-31 ENCOUNTER — Encounter (HOSPITAL_COMMUNITY): Payer: Self-pay | Admitting: Emergency Medicine

## 2013-07-31 ENCOUNTER — Emergency Department (HOSPITAL_COMMUNITY)
Admission: EM | Admit: 2013-07-31 | Discharge: 2013-07-31 | Disposition: A | Discharge status: Home / Self Care | Payer: Self-pay | Attending: Emergency Medicine | Admitting: Emergency Medicine

## 2013-07-31 DIAGNOSIS — Z79899 Other long term (current) drug therapy: Secondary | ICD-10-CM | POA: Insufficient documentation

## 2013-07-31 DIAGNOSIS — E119 Type 2 diabetes mellitus without complications: Secondary | ICD-10-CM | POA: Insufficient documentation

## 2013-07-31 DIAGNOSIS — S42309A Unspecified fracture of shaft of humerus, unspecified arm, initial encounter for closed fracture: Secondary | ICD-10-CM

## 2013-07-31 DIAGNOSIS — E669 Obesity, unspecified: Secondary | ICD-10-CM | POA: Insufficient documentation

## 2013-07-31 DIAGNOSIS — Z87442 Personal history of urinary calculi: Secondary | ICD-10-CM | POA: Insufficient documentation

## 2013-07-31 DIAGNOSIS — G8911 Acute pain due to trauma: Secondary | ICD-10-CM | POA: Insufficient documentation

## 2013-07-31 DIAGNOSIS — Z8719 Personal history of other diseases of the digestive system: Secondary | ICD-10-CM | POA: Insufficient documentation

## 2013-07-31 DIAGNOSIS — M25529 Pain in unspecified elbow: Secondary | ICD-10-CM | POA: Insufficient documentation

## 2013-07-31 DIAGNOSIS — Z8659 Personal history of other mental and behavioral disorders: Secondary | ICD-10-CM | POA: Insufficient documentation

## 2013-07-31 DIAGNOSIS — F17200 Nicotine dependence, unspecified, uncomplicated: Secondary | ICD-10-CM | POA: Insufficient documentation

## 2013-07-31 DIAGNOSIS — R0602 Shortness of breath: Secondary | ICD-10-CM | POA: Insufficient documentation

## 2013-07-31 LAB — CBC
HCT: 43.9 % (ref 39.0–52.0)
Hemoglobin: 15.4 g/dL (ref 13.0–17.0)
MCH: 31.2 pg (ref 26.0–34.0)
MCHC: 35.1 g/dL (ref 30.0–36.0)
MCV: 89 fL (ref 78.0–100.0)
PLATELETS: 227 10*3/uL (ref 150–400)
RBC: 4.93 MIL/uL (ref 4.22–5.81)
RDW: 13.8 % (ref 11.5–15.5)
WBC: 6.9 10*3/uL (ref 4.0–10.5)

## 2013-07-31 LAB — BASIC METABOLIC PANEL
BUN: 11 mg/dL (ref 6–23)
CHLORIDE: 102 meq/L (ref 96–112)
CO2: 24 mEq/L (ref 19–32)
Calcium: 9.4 mg/dL (ref 8.4–10.5)
Creatinine, Ser: 0.68 mg/dL (ref 0.50–1.35)
GFR calc non Af Amer: >90 mL/min (ref >90)
Glucose, Bld: 222 mg/dL — ABNORMAL HIGH (ref 70–99)
POTASSIUM: 3.9 meq/L (ref 3.7–5.3)
Sodium: 141 mEq/L (ref 137–147)

## 2013-07-31 LAB — I-STAT TROPONIN, ED: TROPONIN I, POC: 0.02 ng/mL (ref 0.00–0.08)

## 2013-07-31 MED ORDER — DIAZEPAM 5 MG PO TABS
5.0000 mg | ORAL_TABLET | Freq: Once | ORAL | Status: AC
  Administered 2013-07-31: 5 mg via ORAL
  Filled 2013-07-31: qty 1

## 2013-07-31 MED ORDER — HYDROMORPHONE HCL PF 1 MG/ML IJ SOLN: 2.0000 mg | Freq: Once | INTRAMUSCULAR | Status: DC

## 2013-07-31 MED ORDER — HYDROMORPHONE HCL PF 1 MG/ML IJ SOLN
1.0000 mg | Freq: Once | INTRAMUSCULAR | Status: AC
  Administered 2013-07-31: 1 mg via INTRAVENOUS
  Filled 2013-07-31: qty 1

## 2013-07-31 MED ORDER — DIAZEPAM 5 MG PO TABS: 5.0000 mg | ORAL_TABLET | Freq: Two times a day (BID) | ORAL | Status: DC

## 2013-07-31 MED ORDER — ONDANSETRON HCL 4 MG/2ML IJ SOLN
4.0000 mg | Freq: Once | INTRAMUSCULAR | Status: AC
  Administered 2013-07-31: 4 mg via INTRAVENOUS
  Filled 2013-07-31: qty 2

## 2013-07-31 MED ORDER — OXYCODONE HCL 5 MG PO TABS: 5.0000 mg | ORAL_TABLET | Freq: Four times a day (QID) | ORAL | Status: DC | PRN

## 2013-07-31 NOTE — Discharge Instructions (Signed)
Please read and follow all provided instructions.  Your diagnoses today include:  1. Humerus fracture     Tests performed today include:  An x-ray of the affected area - does NOT show any broken bones  EKG - unchanged from previous  Blood test for heart muscle damage - no heart muscle damage seen  Vital signs. See below for your results today.   Medications prescribed:   Oxycodone - narcotic pain medication  DO NOT drive or perform any activities that require you to be awake and alert because this medicine can make you drowsy. Take only as directed. Do not combine with alcohol.    Valium - muscle relaxer medication  DO NOT drive or perform any activities that require you to be awake and alert because this medicine can make you drowsy. Take only as directed. Do not combine with alcohol.   Take any prescribed medications only as directed.  Home care instructions:   Follow any educational materials contained in this packet  Follow R.I.C.E. Protocol:  R - rest your injury   I  - use ice on injury without applying directly to skin  C - compress injury with bandage or splint  E - elevate the injury as much as possible  Follow-up instructions: Please follow-up with your primary care provider or the provided orthopedic physician as previously planned for your fracture.  See your cardiologist as planned.    If you do not have a primary care doctor -- see below for referral information.   Return instructions:   Please return if your toes are numb or tingling, appear gray or blue, or you have severe pain (also elevate leg and loosen splint or wrap if you were given one)  Please return to the Emergency Department if you experience worsening symptoms.   Please return if you have any other emergent concerns.  Additional Information:  Your vital signs today were: BP 142/105   Pulse 99   Temp(Src) 98 F (36.7 C) (Oral)   Resp 20   Ht 6\' 1"  (1.854 m)   Wt 264 lb (119.75  kg)   BMI 34.84 kg/m2   SpO2 92% If your blood pressure (BP) was elevated above 135/85 this visit, please have this repeated by your doctor within one month. --------------

## 2013-07-31 NOTE — Progress Notes (Signed)
Orthopedic Tech Progress Note Patient Details:  [name] 11-19-1965 [id number]  Ortho Devices Type of Ortho Device: Long arm splint;Coapt Ortho Device/Splint Interventions: Application   Irish Elders 07/31/2013, 1:18 PM

## 2013-07-31 NOTE — ED Provider Notes (Signed)
CSN: [id number]     Arrival date & time 07/31/13  [id number] History   First MD Initiated Contact with Patient 07/31/13 1049     Chief Complaint  Patient presents with  . Chest Pain  . Arm Pain    left arm pain     (Consider location/radiation/quality/duration/timing/severity/associated sxs/prior Treatment) HPI Comments: Patient with history of diabetes, recent left humerus fracture, recent admission for chest pain with echo showing ejection fraction of 25% and grade 2 diastolic dysfunction with dilated left ventricle, plan for catheterization in the next few weeks -- presents with complaint of severe arm pain. Patient states that he has not been able to sleep and that the pain has been severe since he fell. Patient feels that the splint on his arm is too tight. He has been taking oxycodone 5 mg tablets. He states that he took 7 yesterday without relief of pain. Patient also notes that he has had some chest tightness with associated shortness of breath. This is the same as when he was admitted. Last episode was earlier this morning. Onset of symptoms acute. Course is constant. Movement makes the pain worse. Nothing makes it better.  The history is provided by the patient.    Past Medical History  Diagnosis Date  . Cigarette smoker   . Diabetes mellitus   . Obesity   . 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 GERD (gastroesophageal reflux disease)   . IBS (irritable bowel syndrome)   . Anxiety   . History of renal calculi   . Cardiomyopathy     a. 07/2013 Echo:   Past Surgical History  Procedure Laterality Date  . Cholecystectomy     Family History  Problem Relation Age of Onset  . Diabetes Father   . Arthritis Father   . Diabetes Mother   . Arthritis Mother   . Hypertension Brother    History  Substance Use Topics  . Smoking status: Current Every Day Smoker -- 1.00 packs/day    Types: Cigarettes  . Smokeless tobacco: Not on file   . Alcohol Use: Yes     Comment: social    Review of Systems  Constitutional: Negative for fever and diaphoresis.  Eyes: Negative for redness.  Respiratory: Positive for shortness of breath. Negative for cough.   Cardiovascular: Positive for chest pain. Negative for palpitations and leg swelling.  Gastrointestinal: Negative for nausea, vomiting and abdominal pain.  Genitourinary: Negative for dysuria.  Musculoskeletal: Positive for arthralgias. Negative for back pain and neck pain.  Skin: Negative for rash.  Neurological: Negative for syncope and light-headedness.   Allergies  Review of patient's allergies indicates no known allergies.  Home Medications   Current Outpatient Rx  Name  Route  Sig  Dispense  Refill  . ibuprofen (ADVIL,MOTRIN) 200 MG tablet   Oral   Take 600 mg by mouth every 6 (six) hours as needed for mild pain.         Marland Kitchen oxyCODONE (OXY IR/ROXICODONE) 5 MG immediate release tablet   Oral   Take 10 mg by mouth every 4 (four) hours as needed for severe pain.         . carvedilol (COREG) 3.125 MG tablet   Oral   Take 1 tablet (3.125 mg total) by mouth 2 (two) times daily with a meal.   60 tablet   0   . lisinopril (PRINIVIL,ZESTRIL) 2.5 MG tablet   Oral   Take 1 tablet (2.5  mg total) by mouth daily.   30 tablet   0   . metFORMIN (GLUCOPHAGE) 500 MG tablet   Oral   Take 1 tablet (500 mg total) by mouth 2 (two) times daily with a meal.   60 tablet   0    BP 142/105  Pulse 99  Temp(Src) 98 F (36.7 C) (Oral)  Resp 20  Ht 6\' 1"  (1.854 m)  Wt 264 lb (119.75 kg)  BMI 34.84 kg/m2  SpO2 92% Physical Exam  Nursing note and vitals reviewed. Constitutional: He appears well-developed and well-nourished.  HENT:  Head: Normocephalic and atraumatic.  Mouth/Throat: Mucous membranes are normal. Mucous membranes are not dry.  Eyes: Conjunctivae are normal.  Neck: Trachea normal and normal range of motion. Neck supple. Normal carotid pulses and no JVD  present. No muscular tenderness present. Carotid bruit is not present. No tracheal deviation present.  Cardiovascular: Normal rate, regular rhythm, S1 normal, S2 normal, normal heart sounds and intact distal pulses.  Exam reveals no distant heart sounds and no decreased pulses.   No murmur heard. Pulses:      Radial pulses are 2+ on the right side, and 2+ on the left side.  Pulmonary/Chest: Effort normal and breath sounds normal. No respiratory distress. He has no wheezes. He exhibits no tenderness.  Abdominal: Soft. Normal aorta and bowel sounds are normal. There is no tenderness. There is no rebound and no guarding.  Musculoskeletal: He exhibits tenderness. He exhibits no edema.       Left shoulder: He exhibits decreased range of motion, tenderness, bony tenderness and spasm.       Left elbow: Normal.       Left wrist: Normal.       Cervical back: Normal.       Left upper arm: He exhibits tenderness. He exhibits no bony tenderness and no swelling.  L UE long arm splint in place, although it is in poor condition.   Neurological: He is alert.  Skin: Skin is warm and dry. He is not diaphoretic. No cyanosis. No pallor.  Psychiatric: He has a normal mood and affect.    ED Course  Procedures (including critical care time) Labs Review Labs Reviewed  BASIC METABOLIC PANEL - Abnormal; Notable for the following:    Glucose, Bld 222 (*)    All other components within normal limits  CBC  I-STAT TROPOININ, ED   Imaging Review No results found.  EKG Interpretation    Date/Time:  Sunday July 31 2013 09:57:12 EST Ventricular Rate:  100 PR Interval:  164 QRS Duration: 116 QT Interval:  356 QTC Calculation: 459 R Axis:   -83 Text Interpretation:  Normal sinus rhythm Possible Left atrial enlargement Left axis deviation Incomplete right bundle branch block Cannot rule out Anterior infarct , age undetermined No significant change since last tracing Confirmed by HARRISON  MD, FORREST ([id number])  on 07/31/2013 11:23:42 AM           11 :03 AM Patient seen and examined. Work-up initiated. Medications ordered. Pain seems to be related to recent fracture.   Vital signs reviewed and are as follows: Filed Vitals:   07/31/13 1002  BP: 142/105  Pulse: 99  Temp: 98 F (36.7 C)  Resp: 20   11:48 AM Patient more comfortable.   Splint replaced by ortho tech. Patient much more comfortable with valium. He appears comfortable.   Counseled to take 2 5mg  oxycodone every 6 hrs scheduled for pain.   Patient counseled on  use of narcotic pain medications. Counseled not to combine these medications with others containing tylenol. Urged not to drink alcohol, drive, or perform any other activities that requires focus while taking these medications. The patient verbalizes understanding and agrees with the plan.   He will f/u with cardiologist and ortho as planned.   MDM   Final diagnoses:  Humerus fracture   Patient with arm pain -- poorly controlled from fracture. Splint replaced. Pain control maximized and muscle relaxer added. Pt much improved in ED.   Chest pain -- doubt cardiac cause. Patient has had this pain before. Screening EKG and troponin performed and are negative. Do not suspect ACS. Patient with chest tightness. Feel patient is low risk for ACS given history (poor story for ACS/MI), negative troponin(s), unchanged EKG.     Carlisle Cater, PA-C 07/31/13 [phone]

## 2013-07-31 NOTE — ED Notes (Addendum)
Pt c/o left sided chest pain and left arm pain onset Thursday. Pt reprots that he fell on Thursday and was seen and admitted here for fracture to left arm. Pt reports had the chest pain while he was here. Pt reports shortness of breath with activity. Pt reports taking a total of 7 oxycodone yesterday without pain relief.

## 2013-08-01 NOTE — ED Provider Notes (Signed)
Medical screening examination/treatment/procedure(s) were performed by non-physician practitioner and as supervising physician I was immediately available for consultation/collaboration.  EKG Interpretation    Date/Time:  Sunday July 31 2013 09:57:12 EST Ventricular Rate:  100 PR Interval:  164 QRS Duration: 116 QT Interval:  356 QTC Calculation: 459 R Axis:   -83 Text Interpretation:  Normal sinus rhythm Possible Left atrial enlargement Left axis deviation Incomplete right bundle branch block Cannot rule out Anterior infarct , age undetermined No significant change since last tracing Confirmed by Reakwon Barren  MD, [place] [phone]) on 07/31/2013 11:23:42 AM              Blanchard Kelch, MD 08/01/13 1239

## 2013-08-08 ENCOUNTER — Ambulatory Visit (INDEPENDENT_AMBULATORY_CARE_PROVIDER_SITE_OTHER): Payer: Self-pay | Admitting: Nurse Practitioner

## 2013-08-08 ENCOUNTER — Encounter: Payer: Self-pay | Admitting: Nurse Practitioner

## 2013-08-08 ENCOUNTER — Other Ambulatory Visit: Payer: Self-pay | Admitting: Nurse Practitioner

## 2013-08-08 ENCOUNTER — Encounter: Payer: Self-pay | Admitting: *Deleted

## 2013-08-08 VITALS — BP 140/86 | HR 111 | Wt 271.0 lb

## 2013-08-08 DIAGNOSIS — I10 Essential (primary) hypertension: Secondary | ICD-10-CM

## 2013-08-08 DIAGNOSIS — R0789 Other chest pain: Secondary | ICD-10-CM

## 2013-08-08 DIAGNOSIS — E119 Type 2 diabetes mellitus without complications: Secondary | ICD-10-CM

## 2013-08-08 DIAGNOSIS — I420 Dilated cardiomyopathy: Secondary | ICD-10-CM

## 2013-08-08 DIAGNOSIS — I429 Cardiomyopathy, unspecified: Secondary | ICD-10-CM

## 2013-08-08 DIAGNOSIS — I428 Other cardiomyopathies: Secondary | ICD-10-CM

## 2013-08-08 DIAGNOSIS — R072 Precordial pain: Secondary | ICD-10-CM

## 2013-08-08 MED ORDER — CARVEDILOL 3.125 MG PO TABS: 3.1250 mg | ORAL_TABLET | Freq: Two times a day (BID) | ORAL | Status: DC

## 2013-08-08 MED ORDER — SODIUM CHLORIDE 0.9 % IV SOLN: INTRAVENOUS | Status: DC

## 2013-08-08 MED ORDER — LISINOPRIL 2.5 MG PO TABS: 2.5000 mg | ORAL_TABLET | Freq: Every day | ORAL | Status: DC

## 2013-08-08 NOTE — Patient Instructions (Addendum)
Your physician has requested that you have a cardiac catheterization 08/10/13 at 4:00 please arrive at 11am Cardiac catheterization is used to diagnose and/or treat various heart conditions. Doctors may recommend this procedure for a number of different reasons. The most common reason is to evaluate chest pain. Chest pain can be a symptom of coronary artery disease (CAD), and cardiac catheterization can show whether plaque is narrowing or blocking your heart's arteries. This procedure is also used to evaluate the valves, as well as measure the blood flow and oxygen levels in different parts of your heart. For further information please visit [email or web address]. Please follow instruction sheet, as given.  Your physician recommends that you have lab work today:BMET, CBC,INR  Your physician recommends that you schedule a follow-up appointment in: 2 to 3 weeks to see DR. Crenshaw   Your physician recommends that you continue on your current medications as directed. Please refer to the Current Medication list given to you today.

## 2013-08-08 NOTE — Progress Notes (Signed)
Patient Name: [name] Date of Encounter: 08/08/2013  Primary Care Provider:  No current PCP, Otelia Limes, MD Primary Cardiologist:  B. Crenshaw, MD   Patient Profile  48 y/o male that presents today for f/u after recent hospitalization for c/p where he was found to have a newly dx cardiomyopathy (EF 20-25%).  Problem List   Past Medical History  Diagnosis Date  . Cigarette smoker   . Diabetes mellitus   . Obesity   . 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 GERD (gastroesophageal reflux disease)   . IBS (irritable bowel syndrome)   . Anxiety   . History of renal calculi   . Cardiomyopathy     a. 07/2013 Echo: EF 20-25%, diff HK, Gr2 DD, mild MR, mod dil LA/RA.  Marland Kitchen Fracture of left humerus     a. 07/2013.  . Tobacco abuse    Past Surgical History  Procedure Laterality Date  . Cholecystectomy     Allergies  No Known Allergies  HPI  48 yr old A.A. Male with recently diagnosed ICM with an EF of 20-25% presents today for follow-up and lab work to assess readiness for cath. He remains a 1 pack/day smoker. Patient arrived to [facility] ED 2/19 after falling on ice. Patient sustained a horizontal fracture of the midshaft of the left humerus with medial displacement of the distal fracture fragment. Ortho placed a splint and sling to affected arm. Patient complained of chest pain and x-ray revealed cardiomegaly with mild pulmonary edema. EKG revealed nonspecific ST-T wave changes and troponin's negative. Cardiology was consulted and patient was scheduled for cardiac echo which revealed EF of 20-25% with diffuse hypokinesis, with bilateral atrial enlargement and a small pericardial effusion. Patient was discharged 2/20 on carvedilol 3.125 mg and lisinopril 2.5 mg. Patient is here today for follow-up and scheduling of cardiac cath to r/o reversible cause of cardiomyopathy.  Since discharge, he has had occas chest pressure assoc with mild  dyspnea.  More than anything, he has been having left arm/shoulder pain for which he has been using oxycodone and valium.  Due to worsened arm pain he presented to the ED on 2/22 and was later discharged after pain mgmt.  He denies, palpitations, pnd, orthopnea, n, v, dizziness, syncope, edema, weight gain, or early satiety.  He does sleep on 5 pillows @ night but says that he's been doing this for a long time.  Unfortunately, he has not been weighing himself and has not yet started taking coreg/lisinopril or metformin, all of which were rx @ d/c.  Home Medications  Prior to Admission medications   Medication Sig Start Date End Date Taking? Authorizing Provider  carvedilol (COREG) 3.125 MG tablet Take 1 tablet (3.125 mg total) by mouth 2 (two) times daily with a meal. 07/29/13  Yes Velvet Bathe, MD  diazepam (VALIUM) 5 MG tablet Take 1 tablet (5 mg total) by mouth 2 (two) times daily. 07/31/13  Yes Carlisle Cater, PA-C  ibuprofen (ADVIL,MOTRIN) 200 MG tablet Take 600 mg by mouth every 6 (six) hours as needed for mild pain.   Yes Historical Provider, MD  lisinopril (PRINIVIL,ZESTRIL) 2.5 MG tablet Take 1 tablet (2.5 mg total) by mouth daily. 07/29/13  Yes Velvet Bathe, MD  metFORMIN (GLUCOPHAGE) 500 MG tablet Take 1 tablet (500 mg total) by mouth 2 (two) times daily with a meal. 07/29/13 07/29/14 Yes Velvet Bathe, MD  oxyCODONE (OXY IR/ROXICODONE) 5 MG immediate release tablet  Take 10 mg by mouth every 4 (four) hours as needed for severe pain. 07/30/13  Yes Velvet Bathe, MD   Family History  Family History  Problem Relation Age of Onset  . Diabetes Father   . Arthritis Father   . Diabetes Mother   . Arthritis Mother   . Hypertension Brother    Social History  History   Social History  . Marital Status: Single    Spouse Name: N/A    Number of Children: N/A  . Years of Education: N/A   Occupational History  . Not on file.   Social History Main Topics  . Smoking status: Current Every Day  Smoker -- 1.00 packs/day    Types: Cigarettes  . Smokeless tobacco: Not on file  . Alcohol Use: Yes     Comment: social  . Drug Use: No  . Sexual Activity: Not on file   Other Topics Concern  . Not on file   Social History Narrative  . No narrative on file    Review of Systems General:  No chills, fever, night sweats or recent weight changes.  Cardiovascular:  + chest pain, dyspnea on exertion, stable chronic orthopnea. No paroxysmal nocturnal dyspnea, and palpations. He has mild swelling of his left hand/forearm. Dermatological: No rash, lesions/masses Respiratory: + dyspnea. No cough.  Urologic: No hematuria, dysuria Abdominal:   No nausea, vomiting, diarrhea, bright red blood per rectum, melena, or hematemesis Neurologic:  No visual changes, wkns, changes in mental status. All other systems reviewed and are otherwise negative except as noted above.  Physical Exam  Blood pressure 140/86, pulse 111, weight 271 lb (122.925 kg).  General: Pleasant, appears anxious and in pain  Psych: Normal affect. Neuro: Alert and oriented X 3. Moves all extremities spontaneously. HEENT: [place], AT. Wearing glasses. Sclera non-icteric, Conjunctiva non-injected. No lymphedema or thyroimegaly.    Neck: Supple without bruits or JVD. Carotid upstroke 1+.  Lungs:  Resp regular and unlabored, significantly diminished but CTA. Heart: RRR no s3, s4, or murmurs.Cap refill = 3 sec.  Abdomen: Protuberant. Soft, non-tender, non-distended, BS + x 4.  Extremities: 2+ edema in left hand. + sensation and  movement. No clubbing, cyanosis, or edema to the lower ext. DP/PT/Radials 2+ and equal bilaterally.  Accessory Clinical Findings  ECG - ST, pvc, 111, left axis, poor r progression.  No acute st/t changes.  Assessment & Plan  1. Systolic left ventricular dysfunction (EF 20-25%): Cardiomyopathy of unclear etiology up to this point - question ischemic vs nonischemic.  Patient presents to clinic today with BP  140/86, HR 111. He has not been taking the coreg and lisinopril that were rx. We again covered the importance of daily weights, role of meds, med compliance, sodium restriction, and symptom reporting.  As previously discussed with pt and family, he will require cardiac cath to r/o ischemia as a cause of his cardiomyopathy.  The patient understands that risks include but are not limited to stroke (1 in 1000), death (1 in 59), kidney failure [usually temporary] (1 in 500), bleeding (1 in 200), allergic reaction [possibly serious] (1 in 200), and agrees to proceed. We will check cbc, bmet, pt/ptt today and he has been scheduled for cath on Wed 3/4.  2.  Chest Pain:  He continues to have intermittent chest pain.  CE negative while hospitalized.  Cont bb.  Cath on Wednesday.  3. Tobacco abuse: Cessation advised.  Smoking cessation counseling and resources have been offered to patient. He verbalizes  understanding, but does not appear to be motivated to quit @ this time.  4. DM II:  He has carried this dx previously and was on metformin in the past.  He says that he came off of it and thought that he was controlling his DM with diet but has gained about 40 lbs over the past 4 mos.  I advised that he will need primary care f/u and should take metformin as rx on d/c, though he will need to hold it pre-cath.  5.  L humerus fx:  He sees ortho tomorrow.  A decision will be made as to whether or not he will require surgical correction.  Presumably, this will wait until we have cleared him from a cardiac standpoint.  6.  Dispo:  Cath Wed.  F/u [name] in 2-3 wks for further titration of meds.  We will consider referral to CHF clinic going forward.   Murray Hodgkins, NP 5:07 PM 08/08/2013

## 2013-08-09 LAB — BASIC METABOLIC PANEL
BUN: 10 mg/dL (ref 6–23)
CALCIUM: 9.5 mg/dL (ref 8.4–10.5)
CHLORIDE: 102 meq/L (ref 96–112)
CO2: 26 meq/L (ref 19–32)
Creatinine, Ser: 0.8 mg/dL (ref 0.4–1.5)
GFR: 138.21 mL/min (ref >60.00)
Glucose, Bld: 274 mg/dL — ABNORMAL HIGH (ref 70–99)
Potassium: 3.9 mEq/L (ref 3.5–5.1)
SODIUM: 136 meq/L (ref 135–145)

## 2013-08-09 LAB — CBC WITH DIFFERENTIAL/PLATELET
BASOS PCT: 0.6 % (ref 0.0–3.0)
Basophils Absolute: 0 10*3/uL (ref 0.0–0.1)
EOS ABS: 0.1 10*3/uL (ref 0.0–0.7)
Eosinophils Relative: 1.9 % (ref 0.0–5.0)
HCT: 47 % (ref 39.0–52.0)
Hemoglobin: 15.5 g/dL (ref 13.0–17.0)
LYMPHS PCT: 36.5 % (ref 12.0–46.0)
Lymphs Abs: 2.8 10*3/uL (ref 0.7–4.0)
MCHC: 32.9 g/dL (ref 30.0–36.0)
MCV: 91.9 fl (ref 78.0–100.0)
Monocytes Absolute: 0.5 10*3/uL (ref 0.1–1.0)
Monocytes Relative: 7.2 % (ref 3.0–12.0)
NEUTROS PCT: 53.8 % (ref 43.0–77.0)
Neutro Abs: 4.1 10*3/uL (ref 1.4–7.7)
Platelets: 347 10*3/uL (ref 150.0–400.0)
RBC: 5.11 Mil/uL (ref 4.22–5.81)
RDW: 13.6 % (ref 11.5–14.6)
WBC: 7.6 10*3/uL (ref 4.5–10.5)

## 2013-08-09 LAB — PROTIME-INR
INR: 1.2 ratio — ABNORMAL HIGH (ref 0.8–1.0)
Prothrombin Time: 12.5 s — ABNORMAL HIGH (ref 10.2–12.4)

## 2013-08-10 ENCOUNTER — Encounter (HOSPITAL_COMMUNITY)
Admission: RE | Disposition: A | Discharge status: Home / Self Care | Payer: Self-pay | Source: Ambulatory Visit | Attending: Interventional Cardiology

## 2013-08-10 ENCOUNTER — Encounter (HOSPITAL_COMMUNITY): Payer: Self-pay | Admitting: *Deleted

## 2013-08-10 ENCOUNTER — Ambulatory Visit (HOSPITAL_COMMUNITY)
Admission: RE | Admit: 2013-08-10 | Discharge: 2013-08-10 | Disposition: A | Discharge status: Home / Self Care | Payer: Medicaid Other | Source: Ambulatory Visit | Attending: Interventional Cardiology | Admitting: Interventional Cardiology

## 2013-08-10 DIAGNOSIS — E119 Type 2 diabetes mellitus without complications: Secondary | ICD-10-CM | POA: Insufficient documentation

## 2013-08-10 DIAGNOSIS — I428 Other cardiomyopathies: Secondary | ICD-10-CM | POA: Diagnosis not present

## 2013-08-10 DIAGNOSIS — K219 Gastro-esophageal reflux disease without esophagitis: Secondary | ICD-10-CM | POA: Diagnosis not present

## 2013-08-10 DIAGNOSIS — E669 Obesity, unspecified: Secondary | ICD-10-CM | POA: Insufficient documentation

## 2013-08-10 DIAGNOSIS — F17200 Nicotine dependence, unspecified, uncomplicated: Secondary | ICD-10-CM | POA: Insufficient documentation

## 2013-08-10 DIAGNOSIS — F411 Generalized anxiety disorder: Secondary | ICD-10-CM | POA: Insufficient documentation

## 2013-08-10 DIAGNOSIS — I519 Heart disease, unspecified: Secondary | ICD-10-CM | POA: Insufficient documentation

## 2013-08-10 DIAGNOSIS — M79609 Pain in unspecified limb: Secondary | ICD-10-CM | POA: Diagnosis not present

## 2013-08-10 DIAGNOSIS — I420 Dilated cardiomyopathy: Secondary | ICD-10-CM

## 2013-08-10 HISTORY — PX: CARDIAC CATHETERIZATION: SHX172

## 2013-08-10 HISTORY — PX: LEFT HEART CATHETERIZATION WITH CORONARY ANGIOGRAM: SHX5451

## 2013-08-10 LAB — GLUCOSE, CAPILLARY
GLUCOSE-CAPILLARY: 254 mg/dL — AB (ref 70–99)
Glucose-Capillary: 271 mg/dL — ABNORMAL HIGH (ref 70–99)

## 2013-08-10 SURGERY — LEFT HEART CATHETERIZATION WITH CORONARY ANGIOGRAM
Anesthesia: LOCAL

## 2013-08-10 MED ORDER — MIDAZOLAM HCL 2 MG/2ML IJ SOLN
INTRAMUSCULAR | Status: AC
  Filled 2013-08-10: qty 2

## 2013-08-10 MED ORDER — LIDOCAINE HCL (PF) 1 % IJ SOLN
INTRAMUSCULAR | Status: AC
  Filled 2013-08-10: qty 30

## 2013-08-10 MED ORDER — ASPIRIN 81 MG PO CHEW
81.0000 mg | CHEWABLE_TABLET | ORAL | Status: AC
  Administered 2013-08-10: 81 mg via ORAL
  Filled 2013-08-10: qty 1

## 2013-08-10 MED ORDER — SODIUM CHLORIDE 0.9 % IJ SOLN: 3.0000 mL | Freq: Two times a day (BID) | INTRAMUSCULAR | Status: DC

## 2013-08-10 MED ORDER — SODIUM CHLORIDE 0.9 % IJ SOLN: 3.0000 mL | INTRAMUSCULAR | Status: DC | PRN

## 2013-08-10 MED ORDER — NITROGLYCERIN 0.2 MG/ML ON CALL CATH LAB
INTRAVENOUS | Status: AC
  Filled 2013-08-10: qty 1

## 2013-08-10 MED ORDER — SODIUM CHLORIDE 0.9 % IV SOLN: INTRAVENOUS | Status: DC

## 2013-08-10 MED ORDER — SODIUM CHLORIDE 0.9 % IV SOLN
INTRAVENOUS | Status: DC
Start: 2013-08-10 — End: 2013-08-10
  Administered 2013-08-10: 100 mL/h via INTRAVENOUS

## 2013-08-10 MED ORDER — HEPARIN (PORCINE) IN NACL 2-0.9 UNIT/ML-% IJ SOLN
INTRAMUSCULAR | Status: AC
Start: 2013-08-10 — End: 2013-08-10
  Filled 2013-08-10: qty 1500

## 2013-08-10 MED ORDER — SODIUM CHLORIDE 0.9 % IV SOLN: 250.0000 mL | INTRAVENOUS | Status: DC | PRN

## 2013-08-10 MED ORDER — FENTANYL CITRATE 0.05 MG/ML IJ SOLN
INTRAMUSCULAR | Status: AC
  Filled 2013-08-10: qty 2

## 2013-08-10 MED ORDER — METFORMIN HCL 500 MG PO TABS: 500.0000 mg | ORAL_TABLET | Freq: Two times a day (BID) | ORAL | Status: DC

## 2013-08-10 MED ORDER — VERAPAMIL HCL 2.5 MG/ML IV SOLN
INTRAVENOUS | Status: AC
  Filled 2013-08-10: qty 2

## 2013-08-10 MED ORDER — HEPARIN SODIUM (PORCINE) 1000 UNIT/ML IJ SOLN
INTRAMUSCULAR | Status: AC
  Filled 2013-08-10: qty 1

## 2013-08-10 NOTE — Interval H&P Note (Signed)
Cath Lab Visit (complete for each Cath Lab visit)  Clinical Evaluation Leading to the Procedure:   ACS: no  Non-ACS:    Anginal Classification: CCS III  Anti-ischemic medical therapy: Minimal Therapy (1 class of medications)  Non-Invasive Test Results: High-risk stress test findings: cardiac mortality >3%/year; low EF by echo  Prior CABG: No previous CABG      History and Physical Interval Note:  08/10/2013 1:42 PM  [name]  has presented today for surgery, with the diagnosis of cardio myopathy  The various methods of treatment have been discussed with the patient and family. After consideration of risks, benefits and other options for treatment, the patient has consented to  Procedure(s): LEFT HEART CATHETERIZATION WITH CORONARY ANGIOGRAM (N/A) as a surgical intervention .  The patient's history has been reviewed, patient examined, no change in status, stable for surgery.  I have reviewed the patient's chart and labs.  Questions were answered to the patient's satisfaction.     [name] S.

## 2013-08-10 NOTE — H&P (View-Only) (Signed)
Patient Name: [name] Date of Encounter: 08/08/2013  Primary Care Provider:  No current PCP, Otelia Limes, MD Primary Cardiologist:  B. Crenshaw, MD   Patient Profile  48 y/o male that presents today for f/u after recent hospitalization for c/p where he was found to have a newly dx cardiomyopathy (EF 20-25%).  Problem List   Past Medical History  Diagnosis Date  . Cigarette smoker   . Diabetes mellitus   . Obesity   . 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 GERD (gastroesophageal reflux disease)   . IBS (irritable bowel syndrome)   . Anxiety   . History of renal calculi   . Cardiomyopathy     a. 07/2013 Echo: EF 20-25%, diff HK, Gr2 DD, mild MR, mod dil LA/RA.  Marland Kitchen Fracture of left humerus     a. 07/2013.  . Tobacco abuse    Past Surgical History  Procedure Laterality Date  . Cholecystectomy     Allergies  No Known Allergies  HPI  48 yr old A.A. Male with recently diagnosed ICM with an EF of 20-25% presents today for follow-up and lab work to assess readiness for cath. He remains a 1 pack/day smoker. Patient arrived to [facility] ED 2/19 after falling on ice. Patient sustained a horizontal fracture of the midshaft of the left humerus with medial displacement of the distal fracture fragment. Ortho placed a splint and sling to affected arm. Patient complained of chest pain and x-ray revealed cardiomegaly with mild pulmonary edema. EKG revealed nonspecific ST-T wave changes and troponin's negative. Cardiology was consulted and patient was scheduled for cardiac echo which revealed EF of 20-25% with diffuse hypokinesis, with bilateral atrial enlargement and a small pericardial effusion. Patient was discharged 2/20 on carvedilol 3.125 mg and lisinopril 2.5 mg. Patient is here today for follow-up and scheduling of cardiac cath to r/o reversible cause of cardiomyopathy.  Since discharge, he has had occas chest pressure assoc with mild  dyspnea.  More than anything, he has been having left arm/shoulder pain for which he has been using oxycodone and valium.  Due to worsened arm pain he presented to the ED on 2/22 and was later discharged after pain mgmt.  He denies, palpitations, pnd, orthopnea, n, v, dizziness, syncope, edema, weight gain, or early satiety.  He does sleep on 5 pillows @ night but says that he's been doing this for a long time.  Unfortunately, he has not been weighing himself and has not yet started taking coreg/lisinopril or metformin, all of which were rx @ d/c.  Home Medications  Prior to Admission medications   Medication Sig Start Date End Date Taking? Authorizing Provider  carvedilol (COREG) 3.125 MG tablet Take 1 tablet (3.125 mg total) by mouth 2 (two) times daily with a meal. 07/29/13  Yes Velvet Bathe, MD  diazepam (VALIUM) 5 MG tablet Take 1 tablet (5 mg total) by mouth 2 (two) times daily. 07/31/13  Yes Carlisle Cater, PA-C  ibuprofen (ADVIL,MOTRIN) 200 MG tablet Take 600 mg by mouth every 6 (six) hours as needed for mild pain.   Yes Historical Provider, MD  lisinopril (PRINIVIL,ZESTRIL) 2.5 MG tablet Take 1 tablet (2.5 mg total) by mouth daily. 07/29/13  Yes Velvet Bathe, MD  metFORMIN (GLUCOPHAGE) 500 MG tablet Take 1 tablet (500 mg total) by mouth 2 (two) times daily with a meal. 07/29/13 07/29/14 Yes Velvet Bathe, MD  oxyCODONE (OXY IR/ROXICODONE) 5 MG immediate release tablet  Take 10 mg by mouth every 4 (four) hours as needed for severe pain. 07/30/13  Yes Velvet Bathe, MD   Family History  Family History  Problem Relation Age of Onset  . Diabetes Father   . Arthritis Father   . Diabetes Mother   . Arthritis Mother   . Hypertension Brother    Social History  History   Social History  . Marital Status: Single    Spouse Name: N/A    Number of Children: N/A  . Years of Education: N/A   Occupational History  . Not on file.   Social History Main Topics  . Smoking status: Current Every Day  Smoker -- 1.00 packs/day    Types: Cigarettes  . Smokeless tobacco: Not on file  . Alcohol Use: Yes     Comment: social  . Drug Use: No  . Sexual Activity: Not on file   Other Topics Concern  . Not on file   Social History Narrative  . No narrative on file    Review of Systems General:  No chills, fever, night sweats or recent weight changes.  Cardiovascular:  + chest pain, dyspnea on exertion, stable chronic orthopnea. No paroxysmal nocturnal dyspnea, and palpations. He has mild swelling of his left hand/forearm. Dermatological: No rash, lesions/masses Respiratory: + dyspnea. No cough.  Urologic: No hematuria, dysuria Abdominal:   No nausea, vomiting, diarrhea, bright red blood per rectum, melena, or hematemesis Neurologic:  No visual changes, wkns, changes in mental status. All other systems reviewed and are otherwise negative except as noted above.  Physical Exam  Blood pressure 140/86, pulse 111, weight 271 lb (122.925 kg).  General: Pleasant, appears anxious and in pain  Psych: Normal affect. Neuro: Alert and oriented X 3. Moves all extremities spontaneously. HEENT: [place], AT. Wearing glasses. Sclera non-icteric, Conjunctiva non-injected. No lymphedema or thyroimegaly.    Neck: Supple without bruits or JVD. Carotid upstroke 1+.  Lungs:  Resp regular and unlabored, significantly diminished but CTA. Heart: RRR no s3, s4, or murmurs.Cap refill = 3 sec.  Abdomen: Protuberant. Soft, non-tender, non-distended, BS + x 4.  Extremities: 2+ edema in left hand. + sensation and  movement. No clubbing, cyanosis, or edema to the lower ext. DP/PT/Radials 2+ and equal bilaterally.  Accessory Clinical Findings  ECG - ST, pvc, 111, left axis, poor r progression.  No acute st/t changes.  Assessment & Plan  1. Systolic left ventricular dysfunction (EF 20-25%): Cardiomyopathy of unclear etiology up to this point - question ischemic vs nonischemic.  Patient presents to clinic today with BP  140/86, HR 111. He has not been taking the coreg and lisinopril that were rx. We again covered the importance of daily weights, role of meds, med compliance, sodium restriction, and symptom reporting.  As previously discussed with pt and family, he will require cardiac cath to r/o ischemia as a cause of his cardiomyopathy.  The patient understands that risks include but are not limited to stroke (1 in 1000), death (1 in 59), kidney failure [usually temporary] (1 in 500), bleeding (1 in 200), allergic reaction [possibly serious] (1 in 200), and agrees to proceed. We will check cbc, bmet, pt/ptt today and he has been scheduled for cath on Wed 3/4.  2.  Chest Pain:  He continues to have intermittent chest pain.  CE negative while hospitalized.  Cont bb.  Cath on Wednesday.  3. Tobacco abuse: Cessation advised.  Smoking cessation counseling and resources have been offered to patient. He verbalizes  understanding, but does not appear to be motivated to quit @ this time.  4. DM II:  He has carried this dx previously and was on metformin in the past.  He says that he came off of it and thought that he was controlling his DM with diet but has gained about 40 lbs over the past 4 mos.  I advised that he will need primary care f/u and should take metformin as rx on d/c, though he will need to hold it pre-cath.  5.  L humerus fx:  He sees ortho tomorrow.  A decision will be made as to whether or not he will require surgical correction.  Presumably, this will wait until we have cleared him from a cardiac standpoint.  6.  Dispo:  Cath Wed.  F/u [name] in 2-3 wks for further titration of meds.  We will consider referral to CHF clinic going forward.   Murray Hodgkins, NP 5:07 PM 08/08/2013

## 2013-08-10 NOTE — Discharge Instructions (Signed)
F/u with orthopedic surgery.   Follow post radial cath instructions.  Radial Site Care Refer to this sheet in the next few weeks. These instructions provide you with information on caring for yourself after your procedure. Your caregiver may also give you more specific instructions. Your treatment has been planned according to current medical practices, but problems sometimes occur. Call your caregiver if you have any problems or questions after your procedure. HOME CARE INSTRUCTIONS  You may shower the day after the procedure.Remove the bandage (dressing) and gently wash the site with plain soap and water.Gently pat the site dry.  Do not apply powder or lotion to the site.  Do not submerge the affected site in water for 3 to 5 days.  Inspect the site at least twice daily.  Do not flex or bend the affected arm for 24 hours.  No lifting over 5 pounds (2.3 kg) for 5 days after your procedure.  Do not drive home if you are discharged the same day of the procedure. Have someone else drive you.  You may drive 24 hours after the procedure unless otherwise instructed by your caregiver.  Do not operate machinery or power tools for 24 hours.  A responsible adult should be with you for the first 24 hours after you arrive home. What to expect:  Any bruising will usually fade within 1 to 2 weeks.  Blood that collects in the tissue (hematoma) may be painful to the touch. It should usually decrease in size and tenderness within 1 to 2 weeks. SEEK IMMEDIATE MEDICAL CARE IF:  You have unusual pain at the radial site.  You have redness, warmth, swelling, or pain at the radial site.  You have drainage (other than a small amount of blood on the dressing).  You have chills.  You have a fever or persistent symptoms for more than 72 hours.  You have a fever and your symptoms suddenly get worse.  Your arm becomes pale, cool, tingly, or numb.  You have heavy bleeding from the site. Hold  pressure on the site. Document Released: 06/28/2010 Document Revised: 08/18/2011 Document Reviewed: 06/28/2010 [facility] Patient Information 2014 [place], [place].

## 2013-08-10 NOTE — CV Procedure (Signed)
       PROCEDURE:  selective coronary angiography,   INDICATIONS:  Cardiomyopathy  The risks, benefits, and details of the procedure were explained to the patient.  The patient verbalized understanding and wanted to proceed.  Informed written consent was obtained.  PROCEDURE TECHNIQUE:  After Xylocaine anesthesia a 5F slender sheath was placed in the right radial artery with a single anterior needle wall stick.  Intravenous heparin was given.  Right coronary angiography attempted with a Judkins R4 guide catheter, but a Williams right was used subsequently and was successful.  Left coronary angiography was done using a Judkins L3.5 guide catheter.  Left ventriculography was not done.  We attempted to cross the aortic valve with a catheter. Due to significant tortuosity in his right shoulder, we could not advance a catheter into the ascending aorta. The patient had significant pain as we tried to manipulate the catheter. We tried with a JR 4. Subsequently tried with a 4 [place] multipurpose catheter without success. Several different wires were used for support including first core wire as well as a safety J. Due to his discomfort, we stopped trying. Despite pain medication and conscious sedation, the patient was in severe discomfort on the table due to his broken left arm. A TR band was used for hemostasis.   CONTRAST:  Total of 55 cc.  COMPLICATIONS:  None.    HEMODYNAMICS:  Aortic pressure was 116/87.     ANGIOGRAPHIC DATA:   The left main coronary artery is widely patent.  The left anterior descending artery is a large vessel which wraps around the apex. There is a large diagonal vessel which is widely patent. The left anterior descending system is widely patent.  The left circumflex artery is a large dominant vessel. The first obtuse marginal is large and widely patent. The second obtuse marginal is large and branches across the lateral wall and is widely patent. There several more obtuse  marginals and the left PDA which is widely patent.  The right coronary artery is a medium size nondominant vessel which is widely patent.  Angiography of the right radial artery revealed a very high bifurcation from the brachial artery, well above the elbow. There is significant spasm which was treated with intracoronary nitroglycerin.  Angiography at the right subclavian revealed a widely patent RIMA and significant tortuosity at the origin of the innominate artery.  LEFT VENTRICULOGRAM:  Left ventricular angiogram not was done.  IMPRESSIONS:  1. Normal left main coronary artery. 2. Normal left anterior descending artery and its branches. 3. Normal left circumflex artery and its branches. 4. Normal right coronary artery. 5. Significant tortuosity in the right subclavian area which makes catheter manipulation difficult. If An emergency procedure was required, I would not use the right radial approach.  RECOMMENDATION:  Continue aggressive medical therapy for his nonischemic cardiomyopathy. Followup care with [name].

## 2013-08-11 ENCOUNTER — Telehealth: Payer: Self-pay | Admitting: Cardiology

## 2013-08-11 ENCOUNTER — Other Ambulatory Visit (HOSPITAL_COMMUNITY): Payer: Self-pay | Admitting: Orthopedic Surgery

## 2013-08-11 ENCOUNTER — Encounter (HOSPITAL_COMMUNITY): Payer: Self-pay | Admitting: *Deleted

## 2013-08-11 MED ORDER — CEFAZOLIN SODIUM-DEXTROSE 2-3 GM-% IV SOLR
2.0000 g | INTRAVENOUS | Status: AC
  Administered 2013-08-12: 2 g via INTRAVENOUS

## 2013-08-11 NOTE — Telephone Encounter (Signed)
New message     Pt having emergency surgery tomorrow---fracture left humerus.  Need clearance----had heart cath yesterday---fax clearance to [id number] attn Malachy Mood

## 2013-08-11 NOTE — Telephone Encounter (Signed)
Ok for surgery Brian Crenshaw  

## 2013-08-11 NOTE — Progress Notes (Signed)
Pt had a cardiac cath done yesterday and was told verbally that he was cleared from a cardiac standpoint for surgery. No documentation of that in EPIC. I called and spoke with Baird Lyons at Dr. Jess Barters office and she states she will get something from Dr. Irish Lack and get it sent to [place].

## 2013-08-11 NOTE — Telephone Encounter (Signed)
Will forward this note to the number provided

## 2013-08-11 NOTE — Telephone Encounter (Signed)
Will forward for dr crenshaw review  
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
## 2013-08-12 ENCOUNTER — Encounter (HOSPITAL_COMMUNITY): Payer: Self-pay | Admitting: Surgery

## 2013-08-12 ENCOUNTER — Observation Stay (HOSPITAL_COMMUNITY)
Admission: RE | Admit: 2013-08-12 | Discharge: 2013-08-14 | Disposition: A | Discharge status: Home / Self Care | Payer: Medicaid Other | Source: Ambulatory Visit | Attending: Orthopedic Surgery | Admitting: Orthopedic Surgery

## 2013-08-12 ENCOUNTER — Ambulatory Visit (HOSPITAL_COMMUNITY): Payer: Medicaid Other | Admitting: Vascular Surgery

## 2013-08-12 ENCOUNTER — Encounter (HOSPITAL_COMMUNITY)
Admission: RE | Disposition: A | Discharge status: Home / Self Care | Payer: Self-pay | Source: Ambulatory Visit | Attending: Orthopedic Surgery

## 2013-08-12 ENCOUNTER — Encounter (HOSPITAL_COMMUNITY): Payer: Medicaid Other | Admitting: Vascular Surgery

## 2013-08-12 DIAGNOSIS — R011 Cardiac murmur, unspecified: Secondary | ICD-10-CM | POA: Insufficient documentation

## 2013-08-12 DIAGNOSIS — S5420XA Injury of radial nerve at forearm level, unspecified arm, initial encounter: Secondary | ICD-10-CM | POA: Diagnosis not present

## 2013-08-12 DIAGNOSIS — F17200 Nicotine dependence, unspecified, uncomplicated: Secondary | ICD-10-CM | POA: Insufficient documentation

## 2013-08-12 DIAGNOSIS — S42302A Unspecified fracture of shaft of humerus, left arm, initial encounter for closed fracture: Secondary | ICD-10-CM | POA: Diagnosis present

## 2013-08-12 DIAGNOSIS — K589 Irritable bowel syndrome without diarrhea: Secondary | ICD-10-CM | POA: Insufficient documentation

## 2013-08-12 DIAGNOSIS — E669 Obesity, unspecified: Secondary | ICD-10-CM | POA: Diagnosis not present

## 2013-08-12 DIAGNOSIS — W19XXXA Unspecified fall, initial encounter: Secondary | ICD-10-CM | POA: Insufficient documentation

## 2013-08-12 DIAGNOSIS — F411 Generalized anxiety disorder: Secondary | ICD-10-CM | POA: Diagnosis not present

## 2013-08-12 DIAGNOSIS — Z87442 Personal history of urinary calculi: Secondary | ICD-10-CM | POA: Diagnosis not present

## 2013-08-12 DIAGNOSIS — K219 Gastro-esophageal reflux disease without esophagitis: Secondary | ICD-10-CM | POA: Diagnosis not present

## 2013-08-12 DIAGNOSIS — S42309A Unspecified fracture of shaft of humerus, unspecified arm, initial encounter for closed fracture: Secondary | ICD-10-CM | POA: Diagnosis present

## 2013-08-12 DIAGNOSIS — E1142 Type 2 diabetes mellitus with diabetic polyneuropathy: Secondary | ICD-10-CM | POA: Insufficient documentation

## 2013-08-12 DIAGNOSIS — E1149 Type 2 diabetes mellitus with other diabetic neurological complication: Secondary | ICD-10-CM | POA: Diagnosis not present

## 2013-08-12 DIAGNOSIS — I428 Other cardiomyopathies: Secondary | ICD-10-CM | POA: Diagnosis not present

## 2013-08-12 DIAGNOSIS — J42 Unspecified chronic bronchitis: Secondary | ICD-10-CM | POA: Diagnosis not present

## 2013-08-12 HISTORY — PX: ORIF HUMERUS FRACTURE: SHX2126

## 2013-08-12 HISTORY — DX: Cardiac murmur, unspecified: R01.1

## 2013-08-12 HISTORY — DX: Unspecified chronic bronchitis: J42

## 2013-08-12 HISTORY — DX: Calculus of kidney: N20.0

## 2013-08-12 HISTORY — DX: Type 2 diabetes mellitus without complications: E11.9

## 2013-08-12 LAB — POCT I-STAT, CHEM 8
BUN: 8 mg/dL (ref 6–23)
CREATININE: 0.7 mg/dL (ref 0.50–1.35)
Calcium, Ion: 1.23 mmol/L (ref 1.12–1.23)
Chloride: 101 mEq/L (ref 96–112)
Glucose, Bld: 301 mg/dL — ABNORMAL HIGH (ref 70–99)
HCT: 47 % (ref 39.0–52.0)
HEMOGLOBIN: 16 g/dL (ref 13.0–17.0)
POTASSIUM: 4 meq/L (ref 3.7–5.3)
SODIUM: 141 meq/L (ref 137–147)
TCO2: 26 mmol/L (ref 0–100)

## 2013-08-12 LAB — GLUCOSE, CAPILLARY
Glucose-Capillary: 275 mg/dL — ABNORMAL HIGH (ref 70–99)
Glucose-Capillary: 238 mg/dL — ABNORMAL HIGH (ref 70–99)
Glucose-Capillary: 256 mg/dL — ABNORMAL HIGH (ref 70–99)

## 2013-08-12 SURGERY — OPEN REDUCTION INTERNAL FIXATION (ORIF) HUMERAL SHAFT FRACTURE
Anesthesia: General | Site: Arm Upper | Laterality: Left

## 2013-08-12 MED ORDER — FENTANYL CITRATE 0.05 MG/ML IJ SOLN
INTRAMUSCULAR | Status: AC
  Filled 2013-08-12: qty 5

## 2013-08-12 MED ORDER — PHENOL 1.4 % MT LIQD: 1.0000 | OROMUCOSAL | Status: DC | PRN

## 2013-08-12 MED ORDER — INSULIN ASPART 100 UNIT/ML ~~LOC~~ SOLN
0.0000 [IU] | Freq: Three times a day (TID) | SUBCUTANEOUS | Status: DC
  Administered 2013-08-12 – 2013-08-13 (×3): 5 [IU] via SUBCUTANEOUS
  Administered 2013-08-13 – 2013-08-14 (×2): 8 [IU] via SUBCUTANEOUS

## 2013-08-12 MED ORDER — POTASSIUM CHLORIDE IN NACL 20-0.45 MEQ/L-% IV SOLN
INTRAVENOUS | Status: DC
  Administered 2013-08-12: 20:00:00 via INTRAVENOUS
  Filled 2013-08-12 (×5): qty 1000

## 2013-08-12 MED ORDER — MIDAZOLAM HCL 2 MG/2ML IJ SOLN
INTRAMUSCULAR | Status: AC
  Filled 2013-08-12: qty 2

## 2013-08-12 MED ORDER — PROPOFOL 10 MG/ML IV BOLUS
INTRAVENOUS | Status: DC | PRN
  Administered 2013-08-12: 180 mg via INTRAVENOUS

## 2013-08-12 MED ORDER — LIDOCAINE HCL (CARDIAC) 20 MG/ML IV SOLN
INTRAVENOUS | Status: DC | PRN
  Administered 2013-08-12: 80 mg via INTRAVENOUS

## 2013-08-12 MED ORDER — ONDANSETRON HCL 4 MG/2ML IJ SOLN
INTRAMUSCULAR | Status: AC
  Filled 2013-08-12: qty 2

## 2013-08-12 MED ORDER — GLYCOPYRROLATE 0.2 MG/ML IJ SOLN
INTRAMUSCULAR | Status: DC | PRN
  Administered 2013-08-12: 0.4 mg via INTRAVENOUS

## 2013-08-12 MED ORDER — METHOCARBAMOL 500 MG PO TABS
ORAL_TABLET | ORAL | Status: AC
  Filled 2013-08-12: qty 1

## 2013-08-12 MED ORDER — ONDANSETRON HCL 4 MG/2ML IJ SOLN
INTRAMUSCULAR | Status: DC | PRN
  Administered 2013-08-12: 4 mg via INTRAVENOUS

## 2013-08-12 MED ORDER — HYDROMORPHONE HCL PF 1 MG/ML IJ SOLN
INTRAMUSCULAR | Status: AC
  Filled 2013-08-12: qty 1

## 2013-08-12 MED ORDER — OXYCODONE-ACETAMINOPHEN 5-325 MG PO TABS: 1.0000 | ORAL_TABLET | ORAL | Status: DC | PRN

## 2013-08-12 MED ORDER — OXYCODONE HCL 5 MG PO TABS
ORAL_TABLET | ORAL | Status: AC
  Filled 2013-08-12: qty 1

## 2013-08-12 MED ORDER — CEFAZOLIN SODIUM-DEXTROSE 2-3 GM-% IV SOLR
INTRAVENOUS | Status: AC
  Filled 2013-08-12: qty 50

## 2013-08-12 MED ORDER — CEFAZOLIN SODIUM-DEXTROSE 2-3 GM-% IV SOLR
2.0000 g | Freq: Four times a day (QID) | INTRAVENOUS | Status: AC
  Administered 2013-08-12 – 2013-08-13 (×3): 2 g via INTRAVENOUS
  Filled 2013-08-12 (×3): qty 50

## 2013-08-12 MED ORDER — METFORMIN HCL 500 MG PO TABS
500.0000 mg | ORAL_TABLET | Freq: Two times a day (BID) | ORAL | Status: DC
  Administered 2013-08-13 – 2013-08-14 (×3): 500 mg via ORAL
  Filled 2013-08-12 (×5): qty 1

## 2013-08-12 MED ORDER — FENTANYL CITRATE 0.05 MG/ML IJ SOLN
INTRAMUSCULAR | Status: DC | PRN
  Administered 2013-08-12: 100 ug via INTRAVENOUS

## 2013-08-12 MED ORDER — NEOSTIGMINE METHYLSULFATE 1 MG/ML IJ SOLN
INTRAMUSCULAR | Status: DC | PRN
  Administered 2013-08-12: 3 mg via INTRAVENOUS

## 2013-08-12 MED ORDER — LIDOCAINE HCL (CARDIAC) 20 MG/ML IV SOLN
INTRAVENOUS | Status: AC
  Filled 2013-08-12: qty 5

## 2013-08-12 MED ORDER — METOCLOPRAMIDE HCL 5 MG/ML IJ SOLN: 5.0000 mg | Freq: Three times a day (TID) | INTRAMUSCULAR | Status: DC | PRN

## 2013-08-12 MED ORDER — FENTANYL CITRATE 0.05 MG/ML IJ SOLN
INTRAMUSCULAR | Status: AC
  Filled 2013-08-12: qty 2

## 2013-08-12 MED ORDER — METOCLOPRAMIDE HCL 10 MG PO TABS
5.0000 mg | ORAL_TABLET | Freq: Three times a day (TID) | ORAL | Status: DC | PRN
  Administered 2013-08-13: 10 mg via ORAL
  Filled 2013-08-12: qty 1

## 2013-08-12 MED ORDER — CARVEDILOL 3.125 MG PO TABS
3.1250 mg | ORAL_TABLET | Freq: Two times a day (BID) | ORAL | Status: DC
  Administered 2013-08-12 – 2013-08-14 (×4): 3.125 mg via ORAL
  Filled 2013-08-12 (×6): qty 1

## 2013-08-12 MED ORDER — LACTATED RINGERS IV SOLN
INTRAVENOUS | Status: DC
Start: 2013-08-12 — End: 2013-08-12
  Administered 2013-08-12: 12:00:00 via INTRAVENOUS

## 2013-08-12 MED ORDER — ROCURONIUM BROMIDE 100 MG/10ML IV SOLN
INTRAVENOUS | Status: DC | PRN
  Administered 2013-08-12: 50 mg via INTRAVENOUS

## 2013-08-12 MED ORDER — OXYCODONE HCL 5 MG/5ML PO SOLN: 5.0000 mg | Freq: Once | ORAL | Status: AC | PRN

## 2013-08-12 MED ORDER — ACETAMINOPHEN 650 MG RE SUPP: 650.0000 mg | Freq: Four times a day (QID) | RECTAL | Status: DC | PRN

## 2013-08-12 MED ORDER — MIDAZOLAM HCL 2 MG/2ML IJ SOLN
2.0000 mg | INTRAMUSCULAR | Status: DC | PRN
  Administered 2013-08-12: 2 mg via INTRAVENOUS
  Filled 2013-08-12: qty 2

## 2013-08-12 MED ORDER — DOCUSATE SODIUM 100 MG PO CAPS
100.0000 mg | ORAL_CAPSULE | Freq: Two times a day (BID) | ORAL | Status: DC
  Administered 2013-08-12 – 2013-08-14 (×4): 100 mg via ORAL
  Filled 2013-08-12 (×7): qty 1

## 2013-08-12 MED ORDER — BISACODYL 10 MG RE SUPP: 10.0000 mg | Freq: Every day | RECTAL | Status: DC | PRN

## 2013-08-12 MED ORDER — METHOCARBAMOL 500 MG PO TABS
500.0000 mg | ORAL_TABLET | Freq: Four times a day (QID) | ORAL | Status: DC | PRN
  Administered 2013-08-12 – 2013-08-13 (×4): 500 mg via ORAL
  Filled 2013-08-12 (×4): qty 1

## 2013-08-12 MED ORDER — ACETAMINOPHEN 325 MG PO TABS
650.0000 mg | ORAL_TABLET | Freq: Four times a day (QID) | ORAL | Status: DC | PRN
Start: 2013-08-12 — End: 2013-08-14

## 2013-08-12 MED ORDER — KETOROLAC TROMETHAMINE 30 MG/ML IJ SOLN
30.0000 mg | Freq: Once | INTRAMUSCULAR | Status: DC
  Filled 2013-08-12: qty 1

## 2013-08-12 MED ORDER — SODIUM CHLORIDE 0.9 % IV SOLN
10.0000 mg | INTRAVENOUS | Status: DC | PRN
  Administered 2013-08-12: 50 ug/min via INTRAVENOUS

## 2013-08-12 MED ORDER — MENTHOL 3 MG MT LOZG
1.0000 | LOZENGE | OROMUCOSAL | Status: DC | PRN
  Administered 2013-08-12: 3 mg via ORAL
  Filled 2013-08-12: qty 9

## 2013-08-12 MED ORDER — DIAZEPAM 5 MG PO TABS
5.0000 mg | ORAL_TABLET | Freq: Two times a day (BID) | ORAL | Status: DC | PRN
  Administered 2013-08-13: 5 mg via ORAL
  Filled 2013-08-12: qty 1

## 2013-08-12 MED ORDER — MORPHINE SULFATE 2 MG/ML IJ SOLN
1.0000 mg | INTRAMUSCULAR | Status: DC | PRN
  Administered 2013-08-13 (×4): 1 mg via INTRAVENOUS
  Filled 2013-08-12 (×5): qty 1

## 2013-08-12 MED ORDER — ASPIRIN EC 325 MG PO TBEC
325.0000 mg | DELAYED_RELEASE_TABLET | Freq: Every day | ORAL | Status: DC
Start: 2013-08-12 — End: 2013-08-26

## 2013-08-12 MED ORDER — SENNOSIDES-DOCUSATE SODIUM 8.6-50 MG PO TABS: 1.0000 | ORAL_TABLET | Freq: Every evening | ORAL | Status: DC | PRN

## 2013-08-12 MED ORDER — HYDROMORPHONE HCL PF 1 MG/ML IJ SOLN
0.2500 mg | INTRAMUSCULAR | Status: DC | PRN
  Administered 2013-08-12: 0.5 mg via INTRAVENOUS

## 2013-08-12 MED ORDER — 0.9 % SODIUM CHLORIDE (POUR BTL) OPTIME
TOPICAL | Status: DC | PRN
  Administered 2013-08-12: 1000 mL

## 2013-08-12 MED ORDER — LISINOPRIL 2.5 MG PO TABS
2.5000 mg | ORAL_TABLET | Freq: Every day | ORAL | Status: DC
  Administered 2013-08-12 – 2013-08-14 (×3): 2.5 mg via ORAL
  Filled 2013-08-12 (×3): qty 1

## 2013-08-12 MED ORDER — PROPOFOL 10 MG/ML IV BOLUS
INTRAVENOUS | Status: AC
  Filled 2013-08-12: qty 20

## 2013-08-12 MED ORDER — ONDANSETRON HCL 4 MG/2ML IJ SOLN: 4.0000 mg | Freq: Once | INTRAMUSCULAR | Status: DC | PRN

## 2013-08-12 MED ORDER — ONDANSETRON HCL 4 MG/2ML IJ SOLN: 4.0000 mg | Freq: Four times a day (QID) | INTRAMUSCULAR | Status: DC | PRN

## 2013-08-12 MED ORDER — FENTANYL CITRATE 0.05 MG/ML IJ SOLN
100.0000 ug | Freq: Once | INTRAMUSCULAR | Status: AC
  Administered 2013-08-12: 100 ug via INTRAVENOUS

## 2013-08-12 MED ORDER — PHENYLEPHRINE HCL 10 MG/ML IJ SOLN
INTRAMUSCULAR | Status: DC | PRN
  Administered 2013-08-12: 80 ug via INTRAVENOUS
  Administered 2013-08-12 (×2): 120 ug via INTRAVENOUS

## 2013-08-12 MED ORDER — OXYCODONE-ACETAMINOPHEN 5-325 MG PO TABS
1.0000 | ORAL_TABLET | ORAL | Status: DC | PRN
  Administered 2013-08-12 – 2013-08-14 (×6): 2 via ORAL
  Filled 2013-08-12 (×6): qty 2

## 2013-08-12 MED ORDER — METHOCARBAMOL 100 MG/ML IJ SOLN
500.0000 mg | Freq: Four times a day (QID) | INTRAVENOUS | Status: DC | PRN
  Filled 2013-08-12: qty 5

## 2013-08-12 MED ORDER — HYDROCODONE-ACETAMINOPHEN 5-325 MG PO TABS
1.0000 | ORAL_TABLET | ORAL | Status: DC | PRN
  Administered 2013-08-13: 1 via ORAL
  Administered 2013-08-13 – 2013-08-14 (×4): 2 via ORAL
  Filled 2013-08-12 (×5): qty 2

## 2013-08-12 MED ORDER — ONDANSETRON HCL 4 MG PO TABS
4.0000 mg | ORAL_TABLET | Freq: Four times a day (QID) | ORAL | Status: DC | PRN
Start: 2013-08-12 — End: 2013-08-14

## 2013-08-12 MED ORDER — FLEET ENEMA 7-19 GM/118ML RE ENEM: 1.0000 | ENEMA | Freq: Once | RECTAL | Status: AC | PRN

## 2013-08-12 MED ORDER — OXYCODONE HCL 5 MG PO TABS
5.0000 mg | ORAL_TABLET | Freq: Once | ORAL | Status: AC | PRN
  Administered 2013-08-12: 5 mg via ORAL

## 2013-08-12 SURGICAL SUPPLY — 51 items
BANDAGE GAUZE ELAST BULKY 4 IN (GAUZE/BANDAGES/DRESSINGS) ×6 IMPLANT
BIT DRILL Q/COUPLING 1 (BIT) ×3 IMPLANT
BNDG COHESIVE 4X5 TAN STRL (GAUZE/BANDAGES/DRESSINGS) ×3 IMPLANT
BNDG ESMARK 4X9 LF (GAUZE/BANDAGES/DRESSINGS) IMPLANT
CLOSURE WOUND 1/2 X4 (GAUZE/BANDAGES/DRESSINGS) ×1
COVER SURGICAL LIGHT HANDLE (MISCELLANEOUS) ×3 IMPLANT
CUFF TOURNIQUET SINGLE 18IN (TOURNIQUET CUFF) ×3 IMPLANT
CUFF TOURNIQUET SINGLE 24IN (TOURNIQUET CUFF) IMPLANT
DRAPE OEC MINIVIEW 54X84 (DRAPES) ×3 IMPLANT
DRAPE U-SHAPE 47X51 STRL (DRAPES) ×3 IMPLANT
DRSG ADAPTIC 3X8 NADH LF (GAUZE/BANDAGES/DRESSINGS) ×6 IMPLANT
DRSG EMULSION OIL 3X3 NADH (GAUZE/BANDAGES/DRESSINGS) IMPLANT
DRSG PAD ABDOMINAL 8X10 ST (GAUZE/BANDAGES/DRESSINGS) IMPLANT
DURAPREP 26ML APPLICATOR (WOUND CARE) ×3 IMPLANT
ELECT REM PT RETURN 9FT ADLT (ELECTROSURGICAL) ×3
ELECTRODE REM PT RTRN 9FT ADLT (ELECTROSURGICAL) ×1 IMPLANT
GAUZE SPONGE 4X4 16PLY XRAY LF (GAUZE/BANDAGES/DRESSINGS) IMPLANT
GLOVE BIOGEL PI IND STRL 9 (GLOVE) ×1 IMPLANT
GLOVE BIOGEL PI INDICATOR 9 (GLOVE) ×2
GLOVE SURG ORTHO 9.0 STRL STRW (GLOVE) ×3 IMPLANT
GOWN SRG XL XLNG 56XLVL 4 (GOWN DISPOSABLE) IMPLANT
GOWN STRL NON-REIN XL XLG LVL4 (GOWN DISPOSABLE)
KIT BASIN OR (CUSTOM PROCEDURE TRAY) ×3 IMPLANT
KIT ROOM TURNOVER OR (KITS) ×3 IMPLANT
MANIFOLD NEPTUNE II (INSTRUMENTS) ×3 IMPLANT
NS IRRIG 1000ML POUR BTL (IV SOLUTION) ×3 IMPLANT
PACK ORTHO EXTREMITY (CUSTOM PROCEDURE TRAY) ×3 IMPLANT
PAD ABD 8X10 STRL (GAUZE/BANDAGES/DRESSINGS) ×3 IMPLANT
PAD ARMBOARD 7.5X6 YLW CONV (MISCELLANEOUS) ×6 IMPLANT
PAD CAST 4YDX4 CTTN HI CHSV (CAST SUPPLIES) IMPLANT
PADDING CAST COTTON 4X4 STRL (CAST SUPPLIES)
PLATE LOCKING 7 HOLE (Plate) ×3 IMPLANT
SCREW CORTEX ST 4.5X26 (Screw) ×6 IMPLANT
SCREW CORTEX ST 4.5X30 (Screw) ×9 IMPLANT
SCREW CORTEX ST 4.5X34 (Screw) ×3 IMPLANT
SLING ARM FOAM STRAP XLG (SOFTGOODS) ×3 IMPLANT
SPONGE GAUZE 4X4 12PLY (GAUZE/BANDAGES/DRESSINGS) ×3 IMPLANT
SPONGE LAP 18X18 X RAY DECT (DISPOSABLE) ×6 IMPLANT
STAPLER VISISTAT 35W (STAPLE) ×3 IMPLANT
STRIP CLOSURE SKIN 1/2X4 (GAUZE/BANDAGES/DRESSINGS) ×2 IMPLANT
SUCTION FRAZIER TIP 10 FR DISP (SUCTIONS) ×3 IMPLANT
SUT PROLENE 3 0 PS 1 (SUTURE) IMPLANT
SUT VIC AB 2-0 CTB1 (SUTURE) IMPLANT
SUT VIC AB 3-0 X1 27 (SUTURE) ×3 IMPLANT
SYR BULB 3OZ (MISCELLANEOUS) ×3 IMPLANT
TOWEL OR 17X24 6PK STRL BLUE (TOWEL DISPOSABLE) ×3 IMPLANT
TOWEL OR 17X26 10 PK STRL BLUE (TOWEL DISPOSABLE) ×3 IMPLANT
TUBE CONNECTING 12'X1/4 (SUCTIONS) ×1
TUBE CONNECTING 12X1/4 (SUCTIONS) ×2 IMPLANT
WATER STERILE IRR 1000ML POUR (IV SOLUTION) ×3 IMPLANT
YANKAUER SUCT BULB TIP NO VENT (SUCTIONS) ×3 IMPLANT

## 2013-08-12 NOTE — Anesthesia Postprocedure Evaluation (Signed)
  Anesthesia Post-op Note  Patient: [name]  Procedure(s) Performed: Procedure(s) with comments: OPEN REDUCTION INTERNAL FIXATION (ORIF) HUMERAL SHAFT FRACTURE (Left) - Open Reduction Internal Fixation Left Humerus  Patient Location: PACU  Anesthesia Type:General and GA combined with regional for post-op pain  Level of Consciousness: awake, alert  and oriented  Airway and Oxygen Therapy: Patient Spontanous Breathing and Patient connected to nasal cannula oxygen  Post-op Pain: mild  Post-op Assessment: Post-op Vital signs reviewed, Patient's Cardiovascular Status Stable, Respiratory Function Stable, Patent Airway and Pain level controlled  Post-op Vital Signs: stable  Complications: No apparent anesthesia complications

## 2013-08-12 NOTE — Preoperative (Signed)
Beta Blockers   Reason not to administer Beta Blockers:Not Applicable 

## 2013-08-12 NOTE — Op Note (Signed)
OPERATIVE REPORT  DATE OF SURGERY: 08/12/2013  PATIENT:  [name],  48 y.o. male  PRE-OPERATIVE DIAGNOSIS:  Left Humeral Shaft Fracture with no active function of the radial nerve  POST-OPERATIVE DIAGNOSIS:  Left Humeral Shaft Fracture with no active function of the radial nerve  PROCEDURE:  Procedure(s): OPEN REDUCTION INTERNAL FIXATION (ORIF) HUMERAL SHAFT FRACTURE  SURGEON:  Surgeon(s): Newt Minion, MD  ANESTHESIA:   general  EBL:  Minimal ML  SPECIMEN:  No Specimen  TOURNIQUET:  * No tourniquets in log *  PROCEDURE DETAILS: Patient is a 48 year old gentleman who sustained a transverse midshaft humerus fracture. Patient initially presented with absence of radial nerve function. He was splinted and despite splinting patient had progressive distraction across the fracture site. Do to the loss of reduction patient presents at this time for surgical intervention. Patient did have cardiac history and he underwent a cardiac evaluation prior to surgery with a ejection fraction of approximately 20%. Patient was felt to be safe for surgical intervention and presents at this time for open reduction internal fixation. Risks and benefits were discussed including infection neurovascular injury potential for a non-recovery of the radial nerve do to patient's diabetes. Patient family state to understand and wish to proceed at this time. Description of procedure patient was brought to the operating room and underwent a general anesthetic. After adequate levels of anesthesia were obtained patient's left upper extremity was prepped using DuraPrep draped into a sterile field. A midline incision was made anteriorly from the coracoid process down to the flexor crease of the elbow. Blunt dissection was carried down to the fascia of the biceps muscle this was retracted medially. The brachialis was split the fracture was identified into blood retractors were placed around the fracture site. The fibrous  tissue was debrided from the fracture site. The posterior aspect of the humerus was protected to protect the radial nerve. The fracture was reduced and stabilized with a 7-hole large frag screw secured proximally with 6 cortices proximally 6 cortices distally. C-arm fluoroscopy verified alignment in both AP and lateral planes. The wound was irrigated with normal saline. Subcutaneous is closed using 0 Vicryl skin was closed using staples. The wound was covered Adaptic orthopedic sponges AB dressing Kerlix and Coban. Patient was extubated taken to the PACU in stable condition.  PLAN OF CARE: Admit for observation  PATIENT DISPOSITION:  PACU - hemodynamically stable.   Newt Minion, MD 08/12/2013 8:39 PM

## 2013-08-12 NOTE — Anesthesia Preprocedure Evaluation (Addendum)
Anesthesia Evaluation  Patient identified by MRN, date of birth, ID band Patient awake    Reviewed: Allergy & Precautions, H&P , NPO status , Patient's Chart, lab work & pertinent test results  Airway Mallampati: II TM Distance: >3 FB Neck ROM: Full    Dental  (+) Teeth Intact   Pulmonary Current Smoker,  breath sounds clear to auscultation        Cardiovascular Rhythm:Regular Rate:Normal     Neuro/Psych    GI/Hepatic GERD-  ,  Endo/Other  diabetes, Poorly Controlled, Type 2  Renal/GU Renal disease     Musculoskeletal   Abdominal   Peds  Hematology   Anesthesia Other Findings   Reproductive/Obstetrics                         Anesthesia Physical Anesthesia Plan  ASA: III  Anesthesia Plan: General   Post-op Pain Management:    Induction: Intravenous  Airway Management Planned: Oral ETT  Additional Equipment:   Intra-op Plan:   Post-operative Plan: Extubation in OR  Informed Consent: I have reviewed the patients History and Physical, chart, labs and discussed the procedure including the risks, benefits and alternatives for the proposed anesthesia with the patient or authorized representative who has indicated his/her understanding and acceptance.   Dental advisory given  Plan Discussed with: Anesthesiologist, Surgeon and CRNA  Anesthesia Plan Comments:        Anesthesia Quick Evaluation

## 2013-08-12 NOTE — Progress Notes (Signed)
Dr. Linna Caprice at stretcher side and Nurse informed him that I-STAT stated patients blood sugar was 301 and blood glucose machine reading was 275. No orders given by Dr. Linna Caprice at this time. Will continue to monitor.

## 2013-08-12 NOTE — Progress Notes (Signed)
Orthopedic Tech Progress Note Patient Details:  [name] 1965-09-02 [id number]  Ortho Devices Type of Ortho Device: Arm sling Ortho Device/Splint Location: L UE Ortho Device/Splint Interventions: Application Applied replacement sling   [name] 08/12/2013, 9:35 PM

## 2013-08-12 NOTE — Brief Op Note (Cosign Needed)
08/12/2013  2:33 PM  PATIENT:  [name]  48 y.o. male  PRE-OPERATIVE DIAGNOSIS:  Left Humeral Shaft Fracture  POST-OPERATIVE DIAGNOSIS:  Left Humeral Shaft Fracture  PROCEDURE:  Procedure(s) with comments: OPEN REDUCTION INTERNAL FIXATION (ORIF) HUMERAL SHAFT FRACTURE (Left) - Open Reduction Internal Fixation Left Humerus  SURGEON:  Surgeon(s) and Role:    * Newt Minion, MD - Primary  PHYSICIAN ASSISTANT:Modine Oppenheimer Lynnae Sandhoff PAC   ASSISTANTS: none   ANESTHESIA:   general  EBL:     BLOOD ADMINISTERED:none  DRAINS: none   LOCAL MEDICATIONS USED:  NONE  SPECIMEN:  No Specimen  DISPOSITION OF SPECIMEN:  N/A  COUNTS:  YES  TOURNIQUET:  * No tourniquets in log *  DICTATION: .Note written in EPIC  PLAN OF CARE: Admit for overnight observation  PATIENT DISPOSITION:  PACU - hemodynamically stable.   Delay start of Pharmacological VTE agent (>24hrs) due to surgical blood loss or risk of bleeding: yes

## 2013-08-12 NOTE — Anesthesia Procedure Notes (Addendum)
Procedure Name: Intubation Date/Time: 08/12/2013 1:38 PM Performed by: Rush Farmer E Pre-anesthesia Checklist: Emergency Drugs available, Suction available and Patient being monitored Patient Re-evaluated:Patient Re-evaluated prior to inductionOxygen Delivery Method: Circle system utilized Preoxygenation: Pre-oxygenation with 100% oxygen Intubation Type: IV induction Ventilation: Mask ventilation without difficulty Laryngoscope Size: Mac and 4 Grade View: Grade II Tube type: Oral Tube size: 7.5 mm Number of attempts: 1 Airway Equipment and Method: Stylet Placement Confirmation: ETT inserted through vocal cords under direct vision,  positive ETCO2 and breath sounds checked- equal and bilateral Secured at: 22 cm Tube secured with: Tape Dental Injury: Teeth and Oropharynx as per pre-operative assessment     Anesthesia Regional Block:  Supraclavicular block  Pre-Anesthetic Checklist: ,, timeout performed, Correct Patient, Correct Site, Correct Laterality, Correct Procedure, Correct Position, site marked, Risks and benefits discussed,  Surgical consent,  Pre-op evaluation,  At surgeon's request and post-op pain management  Laterality: Left  Prep: chloraprep       Needles:  Injection technique: Single-shot  Needle Type: Echogenic Stimulator Needle     Needle Length: 9cm 9 cm Needle Gauge: 22 and 22 G    Additional Needles:  Procedures: ultrasound guided (picture in chart) and nerve stimulator Supraclavicular block Narrative:  Start time: 08/12/2013 1:10 PM End time: 08/12/2013 1:15 PM Injection made incrementally with aspirations every 5 mL.  Performed by: Personally   Additional Notes: 25 cc 0.5% Marcaine with 1:200 Epi injected easily

## 2013-08-12 NOTE — Transfer of Care (Signed)
Immediate Anesthesia Transfer of Care Note  Patient: [name]  Procedure(s) Performed: Procedure(s) with comments: OPEN REDUCTION INTERNAL FIXATION (ORIF) HUMERAL SHAFT FRACTURE (Left) - Open Reduction Internal Fixation Left Humerus  Patient Location: PACU  Anesthesia Type:General  Level of Consciousness: awake, alert , oriented and patient cooperative  Airway & Oxygen Therapy: Patient Spontanous Breathing and Patient connected to face mask oxygen  Post-op Assessment: Report given to PACU RN, Post -op Vital signs reviewed and stable and Patient moving all extremities  Post vital signs: Reviewed and stable  Complications: No apparent anesthesia complications
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
## 2013-08-13 DIAGNOSIS — S42309A Unspecified fracture of shaft of humerus, unspecified arm, initial encounter for closed fracture: Secondary | ICD-10-CM | POA: Diagnosis not present

## 2013-08-13 LAB — GLUCOSE, CAPILLARY
GLUCOSE-CAPILLARY: 230 mg/dL — AB (ref 70–99)
GLUCOSE-CAPILLARY: 243 mg/dL — AB (ref 70–99)
GLUCOSE-CAPILLARY: 238 mg/dL — AB (ref 70–99)
Glucose-Capillary: 298 mg/dL — ABNORMAL HIGH (ref 70–99)

## 2013-08-13 MED ORDER — MORPHINE SULFATE 2 MG/ML IJ SOLN
1.0000 mg | Freq: Once | INTRAMUSCULAR | Status: AC
  Administered 2013-08-13: 1 mg via INTRAVENOUS

## 2013-08-13 NOTE — Evaluation (Signed)
Occupational Therapy Evaluation Patient Details Name: [name] MRN: [id number] DOB: Nov 15, 1965 Today's Date: 08/13/2013 Time: [id number] OT Time Calculation (min): 70 min  OT Assessment / Plan / Recommendation History of present illness  (Pt. underwent L humeral ORIF secondary to nonhealing fx. DM)   Clinical Impression   Pt. Ed. On proper ADL techniques to compensate for fx. UE. Pt. Ed. On edema management for hand and for exercise for hand and wrist. Pt. Able to return demo. Pt. Ed. On don/doff of splint and proper position of splint when donned. Pt. Does not need further OT services.     OT Assessment  Patient does not need any further OT services    Follow Up Recommendations  No OT follow up    Barriers to Discharge      Equipment Recommendations  None recommended by OT    Recommendations for Other Services    Frequency       Precautions / Restrictions Restrictions Weight Bearing Restrictions: Yes   Pertinent Vitals/Pain L UE nursing is giving max pain meds.    ADL  Eating/Feeding: Performed;Set up Where Assessed - Eating/Feeding: Edge of bed Grooming: Performed;Wash/dry hands;Wash/dry face Where Assessed - Grooming: Unsupported sitting Upper Body Bathing: Simulated;Minimal assistance Where Assessed - Upper Body Bathing: Unsupported sitting Lower Body Bathing: Simulated;Set up Where Assessed - Lower Body Bathing: Unsupported sitting;Unsupported standing Upper Body Dressing: Performed;Moderate assistance Where Assessed - Upper Body Dressing: Unsupported sitting Lower Body Dressing: Performed;Moderate assistance Where Assessed - Lower Body Dressing: Unsupported sitting;Unsupported standing Toilet Transfer: Performed Armed forces technical officer Method: Arts development officer: Comfort height toilet Toileting - Water quality scientist and Hygiene: Min guard Where Assessed - Best boy and Hygiene: Standing ADL Comments: Pt. ed. on proper  techniqe for dressing and to apply sling.     OT Diagnosis:    OT Problem List:   OT Treatment Interventions:     OT Goals(Current goals can be found in the care plan section)    Visit Information  Last OT Received On: 08/13/13 Assistance Needed: +1 History of Present Illness:  (Pt. underwent L humeral ORIF secondary to nonhealing fx. DM)       Prior [place] expects to be discharged to:: Private residence Living Arrangements: Other relatives Available Help at Discharge: Family;Available PRN/intermittently Home Equipment: None Prior Function Level of Independence: Independent Communication Communication: No difficulties Dominant Hand: Right         Vision/Perception Vision - History Baseline Vision: Wears glasses all the time Patient Visual Report: No change from baseline   Cognition  Cognition Arousal/Alertness: Awake/alert Behavior During Therapy: WFL for tasks assessed/performed Overall Cognitive Status: Within Functional Limits for tasks assessed    Extremity/Trunk Assessment Upper Extremity Assessment Upper Extremity Assessment: LUE deficits/detail (secondary to humerous fx. Pt. has decreaed hand and wrist RO) LUE Deficits / Details: Pt. able to flex digits 1/2 range and come near to full ext. Pt. able to wiggle thumb.  LUE Sensation: decreased light touch LUE Coordination: decreased fine motor     Mobility Bed Mobility Overal bed mobility: Independent Transfers Overall transfer level:  (CGA/S) Equipment used: None General transfer comment:  (S to CGA)     Exercise Hand Exercises Wrist Flexion: Self ROM;AROM;Left;10 reps Wrist Extension: Self ROM;AROM;Left;10 reps Digit Composite Flexion: AROM;Self ROM;10 reps;Left Composite Extension: AROM;Self ROM;Left;10 reps Thumb Abduction: AROM;Self ROM;Left;10 reps Thumb Adduction: AROM;Self ROM;Left;10 reps   Balance     End of Session OT - End  of Session Activity  Tolerance: Patient tolerated treatment well Patient left: in bed;with call bell/phone within reach  GO Functional Limitation: Self care Self Care Current Status [id number]): At least 1 percent but less than 20 percent impaired, limited or restricted Self Care Goal Status [id number]): At least 1 percent but less than 20 percent impaired, limited or restricted Self Care Discharge Status [phone]): At least 1 percent but less than 20 percent impaired, limited or restricted   Chantay Whitelock 08/13/2013, 9:16 AM

## 2013-08-13 NOTE — Progress Notes (Signed)
Utilization Review completed.  

## 2013-08-13 NOTE — Progress Notes (Signed)
At 0420, Patient complained of severe pain 10/10 and spasms to left upper arm.  Increased anxiety noted r/t to pain level and stating he was told he wouldn't have pain.  2 percocet had been given 1 hour earlier at 67.  Patient medicated with PRN morphine, robaxin, and valium.  Arm repositioned, elevated and ice pack in place.  Patient educated on realistic pain expectations and appropriate pain management.    At 0530, anxiety has decreased.  Pain has improved slightly and patient assisted to sit on edge of bed.  Sling in place.  Patient able to wiggle fingers slightly.  Dressing reinforcement remains clean and dry.  VSS.  Continue to monitor and provide emotional support and reassurance.

## 2013-08-13 NOTE — Progress Notes (Signed)
Spoke with Dr. Sharol Given regarding patient's pain issues and told him that patient does not want to go home today. Dr. Sharol Given stated that pt can go home tomorrow instead.

## 2013-08-13 NOTE — Progress Notes (Addendum)
Moderate amount bloody drainage noted to dressing and soiling sling.  Left arm dressing reinforced at distal end (elbow to mid-humerus) with abd pad and kerlix gauze.  Ice pack reapplied and elevation maintained.  New sling ordered from Ortho Tech.  Patient and family educated and reassured.  Left arm remains numb, no movement or sensation from nerve block.  2+ radial pulse, warm with good cap refill.  Continue to monitor.

## 2013-08-13 NOTE — Progress Notes (Signed)
Pain remains uncontrolled despite all PRN meds, repositioning, emotional support and reassurance and education.  Patient complains of severe pain to left upper arm, c/o dressing too tight.  CMS intact to LUE.  2+ radial pulse, good cap refill.  Wiggles fingers slightly.  MD notified and order received to give additional 1mg  of IV morphine.  Percocet also due at this time.  MD will round on patient this am.  Continue to monitor.

## 2013-08-13 NOTE — Progress Notes (Signed)
Orthopedic Tech Progress Note Patient Details:  DUC REISCHMAN 1965/10/30 [id number]  Ortho Devices Type of Ortho Device: Sling immobilizer Ortho Device/Splint Location: Replacement immobilizer sling Ortho Device/Splint Interventions: Application   Cammer, Theodoro Parma 08/13/2013, 1:19 PM

## 2013-08-13 NOTE — Discharge Summary (Signed)
  Postoperative day 1 status post open reduction internal fixation left humeral shaft fracture. Dressing is changed wound incision is clean and dry no redness no cellulitis no drainage. Patient is discharged to home in stable condition. Prescription for Percocet for pain. Patient neurovascular status is unchanged still has persistent radial nerve deficit that he had preoperatively. Plan to followup in the office in 2 weeks.

## 2013-08-14 DIAGNOSIS — S42309A Unspecified fracture of shaft of humerus, unspecified arm, initial encounter for closed fracture: Secondary | ICD-10-CM | POA: Diagnosis not present

## 2013-08-14 LAB — GLUCOSE, CAPILLARY: Glucose-Capillary: 278 mg/dL — ABNORMAL HIGH (ref 70–99)

## 2013-08-14 NOTE — Discharge Summary (Signed)
  Patient more comfortable today. No change in the radial nerve function left upper extremity. Patient discharged to home in stable condition today followup in the office in 2 weeks. Prescriptions for Percocet for pain aspirin for DVT prophylaxis he will work on range of motion of his hand to decrease the swelling.

## 2013-08-15 LAB — GLUCOSE, CAPILLARY: Glucose-Capillary: 259 mg/dL — ABNORMAL HIGH (ref 70–99)

## 2013-08-16 ENCOUNTER — Encounter (HOSPITAL_COMMUNITY): Payer: Self-pay | Admitting: Orthopedic Surgery

## 2013-08-17 ENCOUNTER — Other Ambulatory Visit: Payer: Self-pay | Admitting: Pulmonary Disease

## 2013-08-17 DIAGNOSIS — Z7689 Persons encountering health services in other specified circumstances: Secondary | ICD-10-CM

## 2013-08-17 DIAGNOSIS — E119 Type 2 diabetes mellitus without complications: Secondary | ICD-10-CM

## 2013-08-24 ENCOUNTER — Encounter: Payer: Self-pay | Admitting: *Deleted

## 2013-08-26 ENCOUNTER — Encounter: Payer: Self-pay | Admitting: Cardiology

## 2013-08-26 ENCOUNTER — Ambulatory Visit (INDEPENDENT_AMBULATORY_CARE_PROVIDER_SITE_OTHER): Payer: Self-pay | Admitting: Cardiology

## 2013-08-26 VITALS — BP 120/86 | HR 84 | Ht 73.0 in | Wt 275.0 lb

## 2013-08-26 DIAGNOSIS — F17200 Nicotine dependence, unspecified, uncomplicated: Secondary | ICD-10-CM

## 2013-08-26 DIAGNOSIS — I429 Cardiomyopathy, unspecified: Secondary | ICD-10-CM

## 2013-08-26 DIAGNOSIS — I428 Other cardiomyopathies: Secondary | ICD-10-CM

## 2013-08-26 MED ORDER — CARVEDILOL 6.25 MG PO TABS: 6.2500 mg | ORAL_TABLET | Freq: Two times a day (BID) | ORAL | Status: DC

## 2013-08-26 MED ORDER — LISINOPRIL 5 MG PO TABS: 5.0000 mg | ORAL_TABLET | Freq: Every day | ORAL | Status: DC

## 2013-08-26 NOTE — Assessment & Plan Note (Signed)
Patient counseled on discontinuing. 

## 2013-08-26 NOTE — Patient Instructions (Signed)
Your physician recommends that you schedule a follow-up appointment in: [place]  Your physician recommends that you schedule a follow-up appointment in: [place] LISINOPRIL TO 5 MG ONCE DAILY  INCREASE CARVEDILOL TO 6.25 MG TWICE DAILY  Your physician recommends that you return for lab work in: [place]

## 2013-08-26 NOTE — Progress Notes (Signed)
HPI: FU CM. Patient seen 2/19 after falling on ice; sustained a horizontal fracture of the midshaft of the left humerus with medial displacement of the distal fracture fragment. Ortho placed a splint and sling to affected arm. Patient complained of chest pain and x-ray revealed cardiomegaly with mild pulmonary edema. EKG revealed nonspecific ST-T wave changes and troponin's negative. Cardiology was consulted and patient was scheduled for cardiac echo which revealed EF of 20-25% with diffuse hypokinesis, with biatrial enlargement, mild MR and a small pericardial effusion. Patient was discharged on carvedilol 3.125 mg and lisinopril 2.5 mg. TSH in February 2015 normal. Cardiac catheterization in March of 2015 showed normal coronary arteries. Since he was last seen, he denies dyspnea, orthopnea, PND or pedal edema. No syncope. Occasional brief chest pain not related to exertion.   Current Outpatient Prescriptions  Medication Sig Dispense Refill  . carvedilol (COREG) 3.125 MG tablet Take 1 tablet (3.125 mg total) by mouth 2 (two) times daily with a meal.  60 tablet  12  . ibuprofen (ADVIL,MOTRIN) 200 MG tablet Take 600 mg by mouth every 6 (six) hours as needed for mild pain.      Marland Kitchen lisinopril (PRINIVIL,ZESTRIL) 2.5 MG tablet Take 1 tablet (2.5 mg total) by mouth daily.  30 tablet  12  . oxyCODONE-acetaminophen (PERCOCET/ROXICET) 5-325 MG per tablet Take by mouth every 4 (four) hours as needed for severe pain.       No current facility-administered medications for this visit.     Past Medical History  Diagnosis Date  . Cigarette smoker   . Obesity   . 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 History of renal calculi   . Cardiomyopathy     a. 07/2013 Echo: EF 20-25%, diff HK, Gr2 DD, mild MR, mod dil LA/RA.  Marland Kitchen Fracture of left humerus     a. 07/2013.  . Tobacco abuse   . Chronic bronchitis     "get it most q yr" (08/12/2013)  . Kidney stones    . GERD (gastroesophageal reflux disease)     not at present time  . Heart murmur     "born w/it"   . Type II diabetes mellitus     Past Surgical History  Procedure Laterality Date  . Orif humeral shaft fracture Left 08/12/2013  . Cholecystectomy  ~ 2010  . Cardiac catheterization  08/10/13  . Ureteroscopy      "laser for kidney stones"  . Orif humerus fracture Left 08/12/2013    Procedure: OPEN REDUCTION INTERNAL FIXATION (ORIF) HUMERAL SHAFT FRACTURE;  Surgeon: Newt Minion, MD;  Location: [facility];  Service: Orthopedics;  Laterality: Left;  Open Reduction Internal Fixation Left Humerus    History   Social History  . Marital Status: Single    Spouse Name: N/A    Number of Children: N/A  . Years of Education: N/A   Occupational History  . Not on file.   Social History Main Topics  . Smoking status: Current Every Day Smoker -- 1.00 packs/day for 32 years    Types: Cigarettes  . Smokeless tobacco: Never Used  . Alcohol Use: Yes     Comment: 08/12/2013 "might drink a couple times/yr"  . Drug Use: No  . Sexual Activity: Yes   Other Topics Concern  . Not on file   Social History Narrative  . No narrative on file    ROS: no fevers or chills, productive cough,  hemoptysis, dysphasia, odynophagia, melena, hematochezia, dysuria, hematuria, rash, seizure activity, orthopnea, PND, pedal edema, claudication. Remaining systems are negative.  Physical Exam: Well-developed well-nourished in no acute distress.  Skin is warm and dry.  HEENT is normal.  Neck is supple.  Chest is clear to auscultation with normal expansion.  Cardiovascular exam is regular rate and rhythm.  Abdominal exam nontender or distended. No masses palpated. Extremities show no edema. Status post fracture left upper extremity neuro grossly intact

## 2013-08-26 NOTE — Assessment & Plan Note (Signed)
Patient has a nonischemic cardiomyopathy. Catheterization showed no obstructive coronary disease. No history of alcohol. No history of hypertension. No previous viral infection. Previous TSH normal. Plan medical therapy. Increase lisinopril to 5 mg daily. Check potassium and renal function in one week. Increase carvedilol to 6.25 mg by mouth twice a day. Continue to titrate medications as tolerated by blood pressure. Once fully titrated repeat echocardiogram 3 months later. If LV function less than 35% would need to consider ICD.

## 2013-09-02 ENCOUNTER — Other Ambulatory Visit (INDEPENDENT_AMBULATORY_CARE_PROVIDER_SITE_OTHER): Payer: Self-pay

## 2013-09-02 DIAGNOSIS — I428 Other cardiomyopathies: Secondary | ICD-10-CM

## 2013-09-02 LAB — BASIC METABOLIC PANEL
BUN: 8 mg/dL (ref 6–23)
CALCIUM: 9.1 mg/dL (ref 8.4–10.5)
CO2: 25 meq/L (ref 19–32)
Chloride: 106 mEq/L (ref 96–112)
Creatinine, Ser: 0.8 mg/dL (ref 0.4–1.5)
GFR: 142.43 mL/min (ref >60.00)
GLUCOSE: 278 mg/dL — AB (ref 70–99)
POTASSIUM: 3.9 meq/L (ref 3.5–5.1)
Sodium: 138 mEq/L (ref 135–145)

## 2013-09-06 ENCOUNTER — Telehealth: Payer: Self-pay | Admitting: *Deleted

## 2013-09-06 NOTE — Telephone Encounter (Signed)
Spoke with pt, discussed lab results. Pt reports increased SOB and waking with his heart racing. This started about one week ago. He denies edema but is unable to lie flat because he can not breathe. Pt will come to the office tomorrow to see lori gerhardt np.

## 2013-09-07 ENCOUNTER — Encounter: Payer: Self-pay | Admitting: Nurse Practitioner

## 2013-09-07 ENCOUNTER — Ambulatory Visit (INDEPENDENT_AMBULATORY_CARE_PROVIDER_SITE_OTHER): Payer: Self-pay | Admitting: Nurse Practitioner

## 2013-09-07 ENCOUNTER — Encounter (INDEPENDENT_AMBULATORY_CARE_PROVIDER_SITE_OTHER): Payer: Self-pay

## 2013-09-07 VITALS — BP 120/80 | HR 94 | Ht 73.0 in | Wt 272.8 lb

## 2013-09-07 DIAGNOSIS — I428 Other cardiomyopathies: Secondary | ICD-10-CM

## 2013-09-07 DIAGNOSIS — R55 Syncope and collapse: Secondary | ICD-10-CM

## 2013-09-07 MED ORDER — CARVEDILOL 6.25 MG PO TABS: 6.2500 mg | ORAL_TABLET | Freq: Two times a day (BID) | ORAL | Status: DC

## 2013-09-07 MED ORDER — LISINOPRIL 5 MG PO TABS: 5.0000 mg | ORAL_TABLET | Freq: Every day | ORAL | Status: DC

## 2013-09-07 NOTE — Patient Instructions (Addendum)
You can not drive  Stay on your current medicines  See [name] and me in 2 weeks  Referral to Dr. Rayann Heman  We will place a heart monitor for the the next 30 days  I will have someone contact you about a life vest  Call the [place] office at [phone] if you have any questions, problems or concerns.

## 2013-09-07 NOTE — Progress Notes (Signed)
[name] Date of Birth: 06-28-1965 Medical Record [id number]  History of Present Illness: Mr. Ramamurthy is seen back today for a follow up visit. Seen for [name]. He has a newly diagnosed nonischemic CM - normal coronaries by cath - with EF of 20 to 25%. He fell last month on the ice and sustained a horizontal fracture of the midshaft of the left humerus with medial displacement of the distal fracture fragment. He was placed in a sling. Then complained of chest pain/dyspnea and got a CXR showing mild pulmonary edema. Echo with EF down to 20 to 25%. Cath negative for CAD. Placed on low dose beta blocker and ACE. TSH was normal. Other issues include uncontrolled DM, obesity, tobacco abuse, GERD and renal calculi.  Just seen about 2 weeks ago and felt to be doing ok. Coreg and Lisinopril was increased. Plan was to try and titrate meds and plan for repeat echo.   Comes back today. Here with his mother. He notes that he is having more heart racing. Notes it with walking. It wakes him up at night. He says he has passed out twice in the past 2 weeks. Once while sitting on the side of the bed - then found himself in the floor. Second spell while changing the channel on the TV - again found himself on the floor. Thinks it has only been for a second or so. No chest pain. Not swelling. No cough. Ok on his medicine but running out. His left arm is hurting. He is using narcotics. He is not driving. Has applied for Medicaid.   Current Outpatient Prescriptions  Medication Sig Dispense Refill  . carvedilol (COREG) 6.25 MG tablet Take 1 tablet (6.25 mg total) by mouth 2 (two) times daily with a meal.  60 tablet  12  . ibuprofen (ADVIL,MOTRIN) 200 MG tablet Take 600 mg by mouth every 6 (six) hours as needed for mild pain.      Marland Kitchen lisinopril (PRINIVIL,ZESTRIL) 5 MG tablet Take 1 tablet (5 mg total) by mouth daily.  30 tablet  12  . metFORMIN (GLUCOPHAGE) 500 MG tablet Take 500 mg by mouth 2 (two) times daily  with a meal.      . oxyCODONE-acetaminophen (PERCOCET/ROXICET) 5-325 MG per tablet Take by mouth every 4 (four) hours as needed for severe pain.       No current facility-administered medications for this visit.    No Known Allergies  Past Medical History  Diagnosis Date  . Cigarette smoker   . Obesity   . 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 History of renal calculi   . Cardiomyopathy     a. 07/2013 Echo: EF 20-25%, diff HK, Gr2 DD, mild MR, mod dil LA/RA.  Marland Kitchen Fracture of left humerus     a. 07/2013.  . Tobacco abuse   . Chronic bronchitis     "get it most q yr" (08/12/2013)  . Kidney stones   . GERD (gastroesophageal reflux disease)     not at present time  . Heart murmur     "born w/it"   . Type II diabetes mellitus     Past Surgical History  Procedure Laterality Date  . Orif humeral shaft fracture Left 08/12/2013  . Cholecystectomy  ~ 2010  . Cardiac catheterization  08/10/13  . Ureteroscopy      "laser for kidney stones"  . Orif humerus fracture Left 08/12/2013    Procedure: OPEN  REDUCTION INTERNAL FIXATION (ORIF) HUMERAL SHAFT FRACTURE;  Surgeon: Newt Minion, MD;  Location: [facility];  Service: Orthopedics;  Laterality: Left;  Open Reduction Internal Fixation Left Humerus    History  Smoking status  . Current Every Day Smoker -- 1.00 packs/day for 32 years  . Types: Cigarettes  Smokeless tobacco  . Never Used    History  Alcohol Use  . Yes    Comment: 08/12/2013 "might drink a couple times/yr"    Family History  Problem Relation Age of Onset  . Diabetes Father   . Arthritis Father   . Diabetes Mother   . Arthritis Mother   . Hypertension Brother     Review of Systems: The review of systems is per the HPI.  All other systems were reviewed and are negative.  Physical Exam: Pulse 94  Ht 6\' 1"  (1.854 m)  Wt 272 lb 12.8 oz (123.741 kg)  BMI 36.00 kg/m2  SpO2 98% Patient is very pleasant and in no acute  distress. He is obese. Little tearful today. Skin is warm and dry. Color is normal.  HEENT is unremarkable. Normocephalic/atraumatic. PERRL. Sclera are nonicteric. Neck is supple. No masses. No JVD. Lungs are clear. Cardiac exam shows a regular rate and rhythm. Abdomen is soft. Extremities are without edema. Gait and ROM are intact. No gross neurologic deficits noted.  Wt Readings from Last 3 Encounters:  09/07/13 272 lb 12.8 oz (123.741 kg)  08/26/13 275 lb (124.739 kg)  08/12/13 264 lb (119.75 kg)     LABORATORY DATA: EKG today shows sinus rhythm. Reviewed with [name].   Lab Results  Component Value Date   WBC 7.6 08/08/2013   HGB 16.0 08/12/2013   HCT 47.0 08/12/2013   PLT 347.0 08/08/2013   GLUCOSE 278* 09/02/2013   CHOL 147 05/08/2009   TRIG 135.0 05/08/2009   HDL 35.50* 05/08/2009   LDLCALC 85 05/08/2009   ALT 55* 07/29/2013   AST 52* 07/29/2013   NA 138 09/02/2013   K 3.9 09/02/2013   CL 106 09/02/2013   CREATININE 0.8 09/02/2013   BUN 8 09/02/2013   CO2 25 09/02/2013   TSH 0.988 07/29/2013   INR 1.2* 08/08/2013   HGBA1C 10.8* 07/29/2013   Echo Study Conclusions  - Left ventricle: The cavity size was severely dilated. Wall thickness was normal. Systolic function was severely reduced. The estimated ejection fraction was in the range of 20% to 25%. Diffuse hypokinesis. Features are consistent with a pseudonormal left ventricular filling pattern, with concomitant abnormal relaxation and increased filling pressure (grade 2 diastolic dysfunction). - Mitral valve: Mild regurgitation. - Left atrium: The atrium was moderately dilated. - Right atrium: The atrium was mildly dilated. - Pericardium, extracardiac: A small pericardial effusion was identified.  ANGIOGRAPHIC DATA: The left main coronary artery is widely patent.  The left anterior descending artery is a large vessel which wraps around the apex. There is a large diagonal vessel which is widely patent. The left anterior  descending system is widely patent.  The left circumflex artery is a large dominant vessel. The first obtuse marginal is large and widely patent. The second obtuse marginal is large and branches across the lateral wall and is widely patent. There several more obtuse marginals and the left PDA which is widely patent.  The right coronary artery is a medium size nondominant vessel which is widely patent.  Angiography of the right radial artery revealed a very high bifurcation from the brachial artery, well above the  elbow. There is significant spasm which was treated with intracoronary nitroglycerin.  Angiography at the right subclavian revealed a widely patent RIMA and significant tortuosity at the origin of the innominate artery.  LEFT VENTRICULOGRAM: Left ventricular angiogram not was done.  IMPRESSIONS:  1. Normal left main coronary artery. 2. Normal left anterior descending artery and its branches. 3. Normal left circumflex artery and its branches. 4. Normal right coronary artery. 5. Significant tortuosity in the right subclavian area which makes catheter manipulation difficult. If An emergency procedure was required, I would not use the right radial approach. RECOMMENDATION: Continue aggressive medical therapy for his nonischemic cardiomyopathy. Followup care with [name].   Assessment / Plan: 1. NICM - EF of 20 to 25% - now with palpitations/heart racing - discussed with Dr. Rayann Heman - he has recommended event monitor and offering a Life Vest. Patient is interested in life vest - will have Claiborne Billings - Dr. Jackalyn Lombard nurse discuss with him. He is considering placement. Also refer to EP/Dr. Rayann Heman. Continue with current medicines. Has had recent labs. See back in 2 weeks with [name]. He knows that he cannot drive.   2. Left humeral fracture  3. Obesity  Patient is agreeable to this plan and will call if any problems develop in the interim.   Burtis Junes, RN, [facility] [address] [place], [place]  [phone]

## 2013-09-08 LAB — BASIC METABOLIC PANEL
BUN: 10 mg/dL (ref 6–23)
CO2: 26 mEq/L (ref 19–32)
Calcium: 9.6 mg/dL (ref 8.4–10.5)
Chloride: 103 mEq/L (ref 96–112)
Creatinine, Ser: 0.7 mg/dL (ref 0.4–1.5)
GFR: 144.64 mL/min (ref >60.00)
Glucose, Bld: 175 mg/dL — ABNORMAL HIGH (ref 70–99)
Potassium: 3.7 mEq/L (ref 3.5–5.1)
Sodium: 138 mEq/L (ref 135–145)

## 2013-09-19 ENCOUNTER — Encounter: Payer: Self-pay | Admitting: Nurse Practitioner

## 2013-09-19 ENCOUNTER — Ambulatory Visit: Payer: Self-pay | Admitting: Nurse Practitioner

## 2013-09-19 ENCOUNTER — Ambulatory Visit (INDEPENDENT_AMBULATORY_CARE_PROVIDER_SITE_OTHER): Payer: Self-pay | Admitting: Nurse Practitioner

## 2013-09-19 VITALS — BP 118/80 | HR 92 | Ht 73.0 in | Wt 272.8 lb

## 2013-09-19 DIAGNOSIS — R0989 Other specified symptoms and signs involving the circulatory and respiratory systems: Secondary | ICD-10-CM

## 2013-09-19 DIAGNOSIS — R0600 Dyspnea, unspecified: Secondary | ICD-10-CM

## 2013-09-19 DIAGNOSIS — R0609 Other forms of dyspnea: Secondary | ICD-10-CM

## 2013-09-19 DIAGNOSIS — I10 Essential (primary) hypertension: Secondary | ICD-10-CM

## 2013-09-19 DIAGNOSIS — I428 Other cardiomyopathies: Secondary | ICD-10-CM

## 2013-09-19 DIAGNOSIS — R55 Syncope and collapse: Secondary | ICD-10-CM

## 2013-09-19 LAB — BASIC METABOLIC PANEL
BUN: 9 mg/dL (ref 6–23)
CO2: 23 mEq/L (ref 19–32)
Calcium: 9.2 mg/dL (ref 8.4–10.5)
Chloride: 102 mEq/L (ref 96–112)
Creatinine, Ser: 0.7 mg/dL (ref 0.4–1.5)
GFR: 162.2 mL/min (ref >60.00)
Glucose, Bld: 244 mg/dL — ABNORMAL HIGH (ref 70–99)
Potassium: 3.6 mEq/L (ref 3.5–5.1)
Sodium: 137 mEq/L (ref 135–145)

## 2013-09-19 MED ORDER — CARVEDILOL 12.5 MG PO TABS: 12.5000 mg | ORAL_TABLET | Freq: Two times a day (BID) | ORAL | Status: DC

## 2013-09-19 NOTE — Patient Instructions (Addendum)
Stay on your current medicines but we are going to try and increase the Coreg to 12.5 mg two times a day - take 2 of your 6.25 mg tabs two times a day to use those up - the RX for the 12.5 mg is at the drug store  We will check labs today  See [name] in 3 weeks  Change Dr. Jackalyn Lombard visit to after the repeat echo  Repeat echo after May 20th  Call the [place] office at [phone] if you have any questions, problems or concerns.

## 2013-09-19 NOTE — Progress Notes (Signed)
[name] Date of Birth: [birth year] Medical Record [id number]  History of Present Illness: [name] is seen back today for a follow up visit. Seen for [name]. He has a newly diagnosed nonischemic CM - normal coronaries by cath - with EF of 20 to 25%. He fell in February on the ice and sustained a horizontal fracture of the midshaft of the left humerus with medial displacement of the distal fracture fragment. He was placed in a sling. Then complained of chest pain/dyspnea and got a CXR showing mild pulmonary edema. Echo with EF down to 20 to 25%. Cath negative for CAD. Placed on low dose beta blocker and ACE. TSH was normal.   Other issues include uncontrolled DM, obesity, tobacco abuse, GERD and renal calculi.   Just seen about 4 weeks ago and felt to be doing ok. Coreg and Lisinopril was increased. Plan was to try and titrate meds and plan for repeat echo.   I saw him 2 weeks ago as a work in - was having heart racing and had had 2 spells of syncope - placed event monitor - arranged for Life Vest and visit with EP per my discussion with [name].  Comes back today. Here with his mother. He is tired. Still with some shortness of breath - short of breath with and without exertion. No swelling. No cough. Not sleeping - notes that his heart is racing when he tries to lay down. Still with a feeling of his heart is racing - no more syncope. Has his Life Vest in place. Still limited by left arm pain. He is doing left arm exercises. Not driving.   Current Outpatient Prescriptions  Medication Sig Dispense Refill  . ibuprofen (ADVIL,MOTRIN) 200 MG tablet Take 600 mg by mouth every 6 (six) hours as needed for mild pain.      Marland Kitchen lisinopril (PRINIVIL,ZESTRIL) 5 MG tablet Take 1 tablet (5 mg total) by mouth daily.  30 tablet  12  . metFORMIN (GLUCOPHAGE) 500 MG tablet Take 500 mg by mouth 2 (two) times daily with a meal.      . oxyCODONE-acetaminophen (PERCOCET/ROXICET) 5-325 MG per tablet  Take by mouth every 4 (four) hours as needed for severe pain.      . carvedilol (COREG) 12.5 MG tablet Take 1 tablet (12.5 mg total) by mouth 2 (two) times daily.  60 tablet  3   No current facility-administered medications for this visit.    No Known Allergies  Past Medical History  Diagnosis Date  . Cigarette smoker   . Obesity   . 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 History of renal calculi   . Cardiomyopathy     a. 07/2013 Echo: EF 20-25%, diff HK, Gr2 DD, mild MR, mod dil LA/RA.  Marland Kitchen Fracture of left humerus     a. 07/2013.  . Tobacco abuse   . Chronic bronchitis     "get it most q yr" (08/12/2013)  . Kidney stones   . GERD (gastroesophageal reflux disease)     not at present time  . Heart murmur     "born w/it"   . Type II diabetes mellitus     Past Surgical History  Procedure Laterality Date  . Orif humeral shaft fracture Left 08/12/2013  . Cholecystectomy  ~ 2010  . Cardiac catheterization  08/10/13  . Ureteroscopy      "laser for kidney stones"  . Orif humerus fracture  Left 08/12/2013    Procedure: OPEN REDUCTION INTERNAL FIXATION (ORIF) HUMERAL SHAFT FRACTURE;  Surgeon: [name] Minion, MD;  Location: [facility];  Service: Orthopedics;  Laterality: Left;  Open Reduction Internal Fixation Left Humerus    History  Smoking status  . Current Every Day Smoker -- 1.00 packs/day for 32 years  . Types: Cigarettes  Smokeless tobacco  . Never Used    History  Alcohol Use  . Yes    Comment: 08/12/2013 "might drink a couple times/yr"    Family History  Problem Relation Age of Onset  . Diabetes Father   . Arthritis Father   . Diabetes Mother   . Arthritis Mother   . Hypertension Brother     Review of Systems: The review of systems is per the HPI.  All other systems were reviewed and are negative.  Physical Exam: BP 118/80  Pulse 92  Ht 6\' 1"  (1.854 m)  Wt 272 lb 12.8 oz (123.741 kg)  BMI 36.00 kg/m2 Patient is very  pleasant and in no acute distress. Not as tearful today. He is of large stature. Skin is warm and dry. Color is normal.  HEENT is unremarkable. Normocephalic/atraumatic. PERRL. Sclera are nonicteric. Neck is supple. No masses. No JVD. Lungs are clear. Cardiac exam shows a regular rate and rhythm. Life Vest in place. Soft S3. Abdomen is soft. Extremities are without edema. Gait and ROM are intact. No gross neurologic deficits noted.  Wt Readings from Last 3 Encounters:  09/19/13 272 lb 12.8 oz (123.741 kg)  09/07/13 272 lb 12.8 oz (123.741 kg)  08/26/13 275 lb (124.739 kg)     LABORATORY DATA: PENDING  Lab Results  Component Value Date   WBC 7.6 08/08/2013   HGB 16.0 08/12/2013   HCT 47.0 08/12/2013   PLT 347.0 08/08/2013   GLUCOSE 175* 09/07/2013   CHOL 147 05/08/2009   TRIG 135.0 05/08/2009   HDL 35.50* 05/08/2009   LDLCALC 85 05/08/2009   ALT 55* 07/29/2013   AST 52* 07/29/2013   NA 138 09/07/2013   K 3.7 09/07/2013   CL 103 09/07/2013   CREATININE 0.7 09/07/2013   BUN 10 09/07/2013   CO2 26 09/07/2013   TSH 0.988 07/29/2013   INR 1.2* 08/08/2013   HGBA1C 10.8* 07/29/2013   Echo Study Conclusions  - Left ventricle: The cavity size was severely dilated. Wall thickness was normal. Systolic function was severely reduced. The estimated ejection fraction was in the range of 20% to 25%. Diffuse hypokinesis. Features are consistent with a pseudonormal left ventricular filling pattern, with concomitant abnormal relaxation and increased filling pressure (grade 2 diastolic dysfunction). - Mitral valve: Mild regurgitation. - Left atrium: The atrium was moderately dilated. - Right atrium: The atrium was mildly dilated. - Pericardium, extracardiac: A small pericardial effusion was identified.  ANGIOGRAPHIC DATA: The left main coronary artery is widely patent.  The left anterior descending artery is a large vessel which wraps around the apex. There is a large diagonal vessel which is widely patent. The  left anterior descending system is widely patent.  The left circumflex artery is a large dominant vessel. The first obtuse marginal is large and widely patent. The second obtuse marginal is large and branches across the lateral wall and is widely patent. There several more obtuse marginals and the left PDA which is widely patent.  The right coronary artery is a medium size nondominant vessel which is widely patent.  Angiography of the right radial artery revealed a very  high bifurcation from the brachial artery, well above the elbow. There is significant spasm which was treated with intracoronary nitroglycerin.  Angiography at the right subclavian revealed a widely patent RIMA and significant tortuosity at the origin of the innominate artery.  LEFT VENTRICULOGRAM: Left ventricular angiogram not was done.  IMPRESSIONS:  1. Normal left main coronary artery. 2. Normal left anterior descending artery and its branches. 3. Normal left circumflex artery and its branches. 4. Normal right coronary artery. 5. Significant tortuosity in the right subclavian area which makes catheter manipulation difficult. If An emergency procedure was required, I would not use the right radial approach. RECOMMENDATION: Continue aggressive medical therapy for his nonischemic cardiomyopathy. Followup care with [name].    Assessment / Plan:  1. NICM - EF of 20 to 25% - on low dose Coreg and ACE - will try to increase his Coreg to 12.5 mg BID. Advised him that he may feel some worsening shortness of breath for a few days after the increase. Recheck his labs today - BMET and BNP. See [name] in 3 weeks. Needs repeat echo after May 20th - plan to see EP after that. Continue with sodium restriction.   2. Syncope - has his Life Vest in place - his event monitor that he had on for 2 days was unremarkable.   3. Left humeral fracture  Patient is agreeable to this plan and will call if any problems develop in the interim.     Burtis Junes, RN, [facility]  [address] [place]  [place], [place] [address]  [phone]

## 2013-09-20 LAB — BRAIN NATRIURETIC PEPTIDE: Pro B Natriuretic peptide (BNP): 124 pg/mL — ABNORMAL HIGH (ref 0.0–100.0)

## 2013-09-22 ENCOUNTER — Telehealth: Payer: Self-pay | Admitting: Nurse Practitioner

## 2013-09-22 NOTE — Telephone Encounter (Signed)
New message     Cecille Rubin changed the dosage on his carvedilol 12.5mg . He feels sick---can't describe it.  Could it be from the medication?

## 2013-09-22 NOTE — Telephone Encounter (Signed)
S/w pt decreased carvedilol to (9.75) bid pt aware agreeable to plan will call if pt still feels sick

## 2013-09-23 ENCOUNTER — Ambulatory Visit: Payer: Self-pay | Admitting: Nurse Practitioner

## 2013-10-12 ENCOUNTER — Institutional Professional Consult (permissible substitution): Payer: Self-pay | Admitting: Internal Medicine

## 2013-10-13 ENCOUNTER — Telehealth: Payer: Self-pay | Admitting: *Deleted

## 2013-10-13 NOTE — Telephone Encounter (Signed)
Monitor reviewed by dr Stanford Breed shows sinus with pvc  Left message for pt to call

## 2013-10-17 NOTE — Telephone Encounter (Signed)
Called patient back and advised monitor results. He will keep appointment on 5/14 with Dr.Crenshaw.

## 2013-10-17 NOTE — Telephone Encounter (Signed)
Follow up  ° ° °Patient calling back to speak with nurse  °

## 2013-10-20 ENCOUNTER — Encounter: Payer: Self-pay | Admitting: Cardiology

## 2013-10-20 ENCOUNTER — Ambulatory Visit (INDEPENDENT_AMBULATORY_CARE_PROVIDER_SITE_OTHER): Payer: Self-pay | Admitting: Cardiology

## 2013-10-20 VITALS — BP 122/78 | HR 86 | Ht 72.0 in | Wt 279.8 lb

## 2013-10-20 DIAGNOSIS — I429 Cardiomyopathy, unspecified: Secondary | ICD-10-CM

## 2013-10-20 DIAGNOSIS — R55 Syncope and collapse: Secondary | ICD-10-CM | POA: Insufficient documentation

## 2013-10-20 DIAGNOSIS — I428 Other cardiomyopathies: Secondary | ICD-10-CM

## 2013-10-20 MED ORDER — FUROSEMIDE 20 MG PO TABS: 20.0000 mg | ORAL_TABLET | Freq: Every day | ORAL | Status: DC

## 2013-10-20 MED ORDER — VALSARTAN 40 MG PO TABS: 40.0000 mg | ORAL_TABLET | Freq: Two times a day (BID) | ORAL | Status: DC

## 2013-10-20 NOTE — Assessment & Plan Note (Signed)
Patient is complaining of a cough.Discontinue lisinopril. Begin Diovan 40 mg by mouth twice a day. Continue carvedilol. He is also complaining of occasional dyspnea. Begin Lasix 20 mg daily. Check potassium and renal function in one week. He has had previous episodes of syncope. Question cardiac related. He is not always wearing his life vest and I have instructed him to wear this all of the time. He understands not to drive. He will have a repeat echocardiogram in 2 weeks. Hopefully his LV function will have improved on medical therapy. He also has an appointment to see Dr. Rayann Heman in early June. His ejection fraction less than 35% he will require ICD. Etiology of cardiomyopathy remains unclear.

## 2013-10-20 NOTE — Patient Instructions (Signed)
Your physician recommends that you schedule a follow-up appointment in: [place]  STOP LISINOPRIL  START FUROSEMIDE 20 MG ONCE DAILY  START DIOVAN 38 MG ONE TABLET TWICE DAILY  Your physician recommends that you return FOR LAB WORK IN ONE WEEK

## 2013-10-20 NOTE — Progress Notes (Signed)
HPI: FU CM. Patient seen 2/19 after falling on ice; sustained a horizontal fracture of the midshaft of the left humerus with medial displacement of the distal fracture fragment. Ortho placed a splint and sling to affected arm. Patient complained of chest pain and x-ray revealed cardiomegaly with mild pulmonary edema. EKG revealed nonspecific ST-T wave changes and troponin's negative. Cardiology was consulted and patient was scheduled for cardiac echo which revealed EF of 20-25% with diffuse hypokinesis, with biatrial enlargement, mild MR and a small pericardial effusion. Patient was discharged on carvedilol 3.125 mg and lisinopril 2.5 mg. TSH in February 2015 normal. Cardiac catheterization in March of 2015 showed normal coronary arteries. Patient seen in followup and was having syncope. A life vest was arranged. Monitor showed sinus with PVCs. He is scheduled for followup echocardiogram and evaluation by Dr. Rayann Heman. Since last seen, Patient occasionally has dyspnea and orthopnea. No chest pain or pedal edema. He has not had recurrent syncope. No palpitations.   Current Outpatient Prescriptions  Medication Sig Dispense Refill  . aspirin 81 MG tablet Take 81 mg by mouth daily.      . carvedilol (COREG) 12.5 MG tablet Take 1 tablet (12.5 mg total) by mouth 2 (two) times daily.  60 tablet  3  . ibuprofen (ADVIL,MOTRIN) 200 MG tablet Take 600 mg by mouth every 6 (six) hours as needed for mild pain.      Marland Kitchen lisinopril (PRINIVIL,ZESTRIL) 5 MG tablet Take 1 tablet (5 mg total) by mouth daily.  30 tablet  12  . metFORMIN (GLUCOPHAGE) 500 MG tablet Take 500 mg by mouth 2 (two) times daily with a meal.      . oxyCODONE-acetaminophen (PERCOCET/ROXICET) 5-325 MG per tablet Take 1 tablet by mouth every 4 (four) hours as needed for severe pain.        No current facility-administered medications for this visit.     Past Medical History  Diagnosis Date  . Cigarette smoker   . Obesity   . 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 History of renal calculi   . Cardiomyopathy     a. 07/2013 Echo: EF 20-25%, diff HK, Gr2 DD, mild MR, mod dil LA/RA.  Marland Kitchen Fracture of left humerus     a. 07/2013.  . Tobacco abuse   . Chronic bronchitis     "get it most q yr" (08/12/2013)  . Kidney stones   . GERD (gastroesophageal reflux disease)     not at present time  . Heart murmur     "born w/it"   . Type II diabetes mellitus     Past Surgical History  Procedure Laterality Date  . Orif humeral shaft fracture Left 08/12/2013  . Cholecystectomy  ~ 2010  . Cardiac catheterization  08/10/13  . Ureteroscopy      "laser for kidney stones"  . Orif humerus fracture Left 08/12/2013    Procedure: OPEN REDUCTION INTERNAL FIXATION (ORIF) HUMERAL SHAFT FRACTURE;  Surgeon: Newt Minion, MD;  Location: [facility];  Service: Orthopedics;  Laterality: Left;  Open Reduction Internal Fixation Left Humerus    History   Social History  . Marital Status: Single    Spouse Name: N/A    Number of Children: N/A  . Years of Education: N/A   Occupational History  . Not on file.   Social History Main Topics  . Smoking status: Current Every Day Smoker -- 0.50 packs/day for 32 years  Types: Cigarettes  . Smokeless tobacco: Never Used  . Alcohol Use: Yes     Comment: 08/12/2013 "might drink a couple times/yr"  . Drug Use: No  . Sexual Activity: Yes   Other Topics Concern  . Not on file   Social History Narrative  . No narrative on file    ROS: no fevers or chills, productive cough, hemoptysis, dysphasia, odynophagia, melena, hematochezia, dysuria, hematuria, rash, seizure activity, orthopnea, PND, pedal edema, claudication. Remaining systems are negative.  Physical Exam: Well-developed well-nourished in no acute distress.  Skin is warm and dry.  HEENT is normal.  Neck is supple.  Chest is clear to auscultation with normal expansion.  Cardiovascular exam is  regular rate and rhythm.  Abdominal exam nontender or distended. No masses palpated. Extremities show no edema. neuro grossly intact

## 2013-10-20 NOTE — Assessment & Plan Note (Signed)
2 previous episodes of syncope. Question cardiac related. Life vest in place. Followup echocardiogram and EP evaluation as outlined under cardiomyopathy.

## 2013-10-24 ENCOUNTER — Telehealth: Payer: Self-pay | Admitting: Cardiology

## 2013-10-24 MED ORDER — LOSARTAN POTASSIUM 25 MG PO TABS: 25.0000 mg | ORAL_TABLET | Freq: Every day | ORAL | Status: DC

## 2013-10-24 NOTE — Telephone Encounter (Signed)
Spoke with pt, aware will forward for dr Stanford Breed review

## 2013-10-24 NOTE — Telephone Encounter (Signed)
New message    diovan cost is  $ 170.00  Is there an alternative.    What was the medication [name] had increase.

## 2013-10-24 NOTE — Telephone Encounter (Signed)
Left message for pt of dr Stanford Breed recommendations. Script sent to the pharm

## 2013-10-24 NOTE — Telephone Encounter (Signed)
Change diovan to cozaar 25 mg daily Lelon Perla

## 2013-11-01 ENCOUNTER — Ambulatory Visit (HOSPITAL_COMMUNITY): Payer: Self-pay | Attending: Cardiology | Admitting: Radiology

## 2013-11-01 DIAGNOSIS — Z09 Encounter for follow-up examination after completed treatment for conditions other than malignant neoplasm: Secondary | ICD-10-CM | POA: Insufficient documentation

## 2013-11-01 DIAGNOSIS — R55 Syncope and collapse: Secondary | ICD-10-CM | POA: Insufficient documentation

## 2013-11-01 DIAGNOSIS — R079 Chest pain, unspecified: Secondary | ICD-10-CM

## 2013-11-01 DIAGNOSIS — R0602 Shortness of breath: Secondary | ICD-10-CM

## 2013-11-01 DIAGNOSIS — R0989 Other specified symptoms and signs involving the circulatory and respiratory systems: Secondary | ICD-10-CM | POA: Insufficient documentation

## 2013-11-01 DIAGNOSIS — I428 Other cardiomyopathies: Secondary | ICD-10-CM

## 2013-11-01 DIAGNOSIS — R0609 Other forms of dyspnea: Secondary | ICD-10-CM | POA: Insufficient documentation

## 2013-11-01 DIAGNOSIS — I10 Essential (primary) hypertension: Secondary | ICD-10-CM | POA: Insufficient documentation

## 2013-11-01 DIAGNOSIS — R0600 Dyspnea, unspecified: Secondary | ICD-10-CM

## 2013-11-01 NOTE — Progress Notes (Signed)
Echocardiogram performed.  

## 2013-11-09 ENCOUNTER — Ambulatory Visit (INDEPENDENT_AMBULATORY_CARE_PROVIDER_SITE_OTHER): Payer: Self-pay | Admitting: Internal Medicine

## 2013-11-09 ENCOUNTER — Encounter: Payer: Self-pay | Admitting: Internal Medicine

## 2013-11-09 ENCOUNTER — Encounter: Payer: Self-pay | Admitting: *Deleted

## 2013-11-09 VITALS — BP 118/80 | HR 82 | Ht 73.0 in | Wt 279.0 lb

## 2013-11-09 DIAGNOSIS — F17200 Nicotine dependence, unspecified, uncomplicated: Secondary | ICD-10-CM

## 2013-11-09 DIAGNOSIS — I519 Heart disease, unspecified: Secondary | ICD-10-CM

## 2013-11-09 DIAGNOSIS — R55 Syncope and collapse: Secondary | ICD-10-CM

## 2013-11-09 DIAGNOSIS — Z720 Tobacco use: Secondary | ICD-10-CM

## 2013-11-09 DIAGNOSIS — I429 Cardiomyopathy, unspecified: Secondary | ICD-10-CM

## 2013-11-09 DIAGNOSIS — I428 Other cardiomyopathies: Secondary | ICD-10-CM | POA: Insufficient documentation

## 2013-11-09 DIAGNOSIS — I420 Dilated cardiomyopathy: Secondary | ICD-10-CM

## 2013-11-09 NOTE — Patient Instructions (Signed)
Your physician has recommended that you have a defibrillator inserted. An implantable cardioverter defibrillator (ICD) is a small device that is placed in your chest or, in rare cases, your abdomen. This device uses electrical pulses or shocks to help control life-threatening, irregular heartbeats that could lead the heart to suddenly stop beating (sudden cardiac arrest). Leads are attached to the ICD that goes into your heart. This is done in the hospital and usually requires an overnight stay. Please see the instruction sheet given to you today for more information.  See instruction sheet 

## 2013-11-09 NOTE — Progress Notes (Signed)
Primary Care Physician: Noralee Space, MD Referring Physician:  Dr Kenyon Ana is a 48 y.o. male with a h/o a nonischemic CM, NYHA Class III CHF, and obesity who presents for EP consultation regarding risks of sudden death.  He initially presented 2/15 following a humeral fracture.  At that time, he was found to have cardiomegaly on CXR and an EF of 20-25% by echo.   He underwent cath in March of 2015 which showed normal coronary arteries.  He was evaluated by Dr Stanford Breed and has been treated with an optimal medical regimen since that time.  Despite optimal medical therapy, he has a persistently depressed EF.  He has also had 2 episodes of abrupt onset syncope.  This was felt to likely be arrhythmogenic in nature and a LifeVest was placed.  The patient continues to wear his lifevest and has had no further syncope.  He worn an event monitor which revealed sinus rhythm with PVCS. Presently, he reports SOB with moderate activity.  He has occasional SOB at rest as well as 2 pillow orthopnea.  He has mild BLE edema.  Today, he denies symptoms of palpitations, chest pain, dizziness, presyncope, syncope, or neurologic sequela. The patient is tolerating medications without difficulties and is otherwise without complaint today.   Past Medical History  Diagnosis Date  . Obesity   . 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 History of renal calculi   . Cardiomyopathy     a. 07/2013 Echo: EF 20-25%, diff HK, Gr2 DD, mild MR, mod dil LA/RA.  Marland Kitchen Fracture of left humerus     a. 07/2013.  . Tobacco abuse   . Chronic bronchitis     "get it most q yr" (08/12/2013)  . Kidney stones   . GERD (gastroesophageal reflux disease)     not at present time  . Heart murmur     "born w/it"   . Type II diabetes mellitus   . Obesity   . Chronic systolic dysfunction of left ventricle   . Syncope    Past Surgical History  Procedure Laterality Date  . Orif  humeral shaft fracture Left 08/12/2013  . Cholecystectomy  ~ 2010  . Cardiac catheterization  08/10/13  . Ureteroscopy      "laser for kidney stones"  . Orif humerus fracture Left 08/12/2013    Procedure: OPEN REDUCTION INTERNAL FIXATION (ORIF) HUMERAL SHAFT FRACTURE;  Surgeon: Newt Minion, MD;  Location: [facility];  Service: Orthopedics;  Laterality: Left;  Open Reduction Internal Fixation Left Humerus    Current Outpatient Prescriptions  Medication Sig Dispense Refill  . aspirin 81 MG tablet Take 81 mg by mouth daily.      . carvedilol (COREG) 12.5 MG tablet Take 1 tablet (12.5 mg total) by mouth 2 (two) times daily.  60 tablet  3  . furosemide (LASIX) 20 MG tablet Take 1 tablet (20 mg total) by mouth daily.  90 tablet  3  . ibuprofen (ADVIL,MOTRIN) 200 MG tablet Take 600 mg by mouth every 6 (six) hours as needed for mild pain.      Marland Kitchen losartan (COZAAR) 25 MG tablet Take 1 tablet (25 mg total) by mouth daily.  90 tablet  3  . metFORMIN (GLUCOPHAGE) 500 MG tablet Take 500 mg by mouth 2 (two) times daily with a meal.      . oxyCODONE-acetaminophen (PERCOCET/ROXICET) 5-325 MG per tablet Take 1 tablet by mouth every 4 (  four) hours as needed for severe pain.        No current facility-administered medications for this visit.    Allergies  Allergen Reactions  . Bee Venom Anaphylaxis    History   Social History  . Marital Status: Single    Spouse Name: N/A    Number of Children: N/A  . Years of Education: N/A   Occupational History  . Not on file.   Social History Main Topics  . Smoking status: Current Every Day Smoker -- 0.50 packs/day for 32 years    Types: Cigarettes  . Smokeless tobacco: Never Used  . Alcohol Use: Yes     Comment: 08/12/2013 "might drink a couple times/yr"  . Drug Use: No  . Sexual Activity: Yes   Other Topics Concern  . Not on file   Social History Narrative  . No narrative on file    Family History  Problem Relation Age of Onset  . Diabetes Father   .  Arthritis Father   . Diabetes Mother   . Arthritis Mother   . Hypertension Brother     ROS- All systems are reviewed and negative except as per the HPI above  Physical Exam: Filed Vitals:   11/09/13 1418  BP: 118/80  Pulse: 82  Height: 6\' 1"  (1.854 m)  Weight: 279 lb (126.554 kg)    GEN- The patient is overweight but pleasant appearing, alert and oriented x 3 today.   He is wearing a LifeVest today. Head- normocephalic, atraumatic Eyes-  Sclera clear, conjunctiva pink Ears- hearing intact Oropharynx- clear Neck- supple, JVP difficult to assess Lungs- Clear to ausculation bilaterally, normal work of breathing Heart- Regular rate and rhythm, no murmurs, rubs or gallops, laterally displaced pmi GI- soft, NT, ND, + BS Extremities- no clubbing, cyanosis, trace edema MS- no significant deformity or atrophy Skin- no rash or lesion Psych- euthymic mood, full affect Neuro- strength and sensation are intact  EKG 09/07/13- sinus rhythm 85 bpm, PR 172, QRS 114, Qtc 438 Event monitor is reviewed from 4/15 Echo 11/01/13- EF 30-35%, mild LVH, LA 5mm  Assessment and Plan:  1. Nonischemic CM/ chronic systolic dysfunction/ syncope The patient has a nonischemic CM (EF 30%), NYHA Class III CHF, and syncope. At this time, he meets criteria for ICD implantation for secondary prevention of syncope/ sudden death.  He has been treated with an optimal medical therapy > 90 days but continues to have a depressed EF.  He has a narrow QRS and therefore does not meet criteria for CRT.  Risks, benefits, alternatives to ICD implantation were discussed in detail with the patient today. The patient  understands that the risks include but are not limited to bleeding, infection, pneumothorax, perforation, tamponade, vascular damage, renal failure, MI, stroke, death, inappropriate shocks, and lead dislodgement and wishes to proceed.  We will therefore schedule device implantation at the next available time.  Given  difficult to assess volume status, I think that either Coreview or Optivol would be helpful in volume status management long term.  We will likely enroll in our ICM device clinic after device implantation.  2. Tobacco Cessation is strongly advised

## 2013-11-22 ENCOUNTER — Emergency Department (INDEPENDENT_AMBULATORY_CARE_PROVIDER_SITE_OTHER)
Admission: EM | Admit: 2013-11-22 | Discharge: 2013-11-22 | Disposition: A | Discharge status: Home / Self Care | Payer: Self-pay | Source: Home / Self Care | Attending: Family Medicine | Admitting: Family Medicine

## 2013-11-22 ENCOUNTER — Encounter (HOSPITAL_COMMUNITY): Payer: Self-pay | Admitting: Emergency Medicine

## 2013-11-22 DIAGNOSIS — J019 Acute sinusitis, unspecified: Secondary | ICD-10-CM

## 2013-11-22 MED ORDER — GUAIFENESIN-CODEINE 100-10 MG/5ML PO SYRP: 10.0000 mL | ORAL_SOLUTION | Freq: Four times a day (QID) | ORAL | Status: DC | PRN

## 2013-11-22 MED ORDER — METHYLPREDNISOLONE ACETATE 40 MG/ML IJ SUSP
80.0000 mg | Freq: Once | INTRAMUSCULAR | Status: AC
  Administered 2013-11-22: 80 mg via INTRAMUSCULAR

## 2013-11-22 MED ORDER — MINOCYCLINE HCL 100 MG PO CAPS: 100.0000 mg | ORAL_CAPSULE | Freq: Two times a day (BID) | ORAL | Status: DC

## 2013-11-22 MED ORDER — METHYLPREDNISOLONE ACETATE 80 MG/ML IJ SUSP
INTRAMUSCULAR | Status: AC
  Filled 2013-11-22: qty 1

## 2013-11-22 NOTE — ED Provider Notes (Signed)
CSN: [id number]     Arrival date & time 11/22/13  [id number] History   First MD Initiated Contact with Patient 11/22/13 [phone]     Chief Complaint  Patient presents with  . Cough   (Consider location/radiation/quality/duration/timing/severity/associated sxs/prior Treatment) Patient is a 48 y.o. male presenting with cough. The history is provided by the patient.  Cough Cough characteristics:  Productive Sputum characteristics:  Yellow Severity:  Mild Onset quality:  Gradual Duration:  4 days Progression:  Unchanged Chronicity:  Chronic Smoker: yes   Context: exposure to allergens and weather changes   Context comment:  H/o bronchitis , followed by dr Lenna Gilford -pulm Ineffective treatments:  None tried Associated symptoms: rhinorrhea   Associated symptoms: no sore throat and no wheezing     Past Medical History  Diagnosis Date  . Obesity   . 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 History of renal calculi   . Cardiomyopathy     a. 07/2013 Echo: EF 20-25%, diff HK, Gr2 DD, mild MR, mod dil LA/RA.  Marland Kitchen Fracture of left humerus     a. 07/2013.  . Tobacco abuse   . Chronic bronchitis     "get it most q yr" (08/12/2013)  . Kidney stones   . GERD (gastroesophageal reflux disease)     not at present time  . Heart murmur     "born w/it"   . Type II diabetes mellitus   . Obesity   . Chronic systolic dysfunction of left ventricle   . Syncope    Past Surgical History  Procedure Laterality Date  . Orif humeral shaft fracture Left 08/12/2013  . Cholecystectomy  ~ 2010  . Cardiac catheterization  08/10/13  . Ureteroscopy      "laser for kidney stones"  . Orif humerus fracture Left 08/12/2013    Procedure: OPEN REDUCTION INTERNAL FIXATION (ORIF) HUMERAL SHAFT FRACTURE;  Surgeon: Newt Minion, MD;  Location: [facility];  Service: Orthopedics;  Laterality: Left;  Open Reduction Internal Fixation Left Humerus   Family History  Problem Relation Age of Onset   . Diabetes Father   . Arthritis Father   . Diabetes Mother   . Arthritis Mother   . Hypertension Brother    History  Substance Use Topics  . Smoking status: Current Every Day Smoker -- 0.50 packs/day for 32 years    Types: Cigarettes  . Smokeless tobacco: Never Used  . Alcohol Use: Yes     Comment: 08/12/2013 "might drink a couple times/yr"    Review of Systems  Constitutional: Negative.   HENT: Positive for congestion, postnasal drip and rhinorrhea. Negative for sore throat.   Respiratory: Positive for cough. Negative for wheezing.     Allergies  Bee venom  Home Medications   Prior to Admission medications   Medication Sig Start Date End Date Taking? Authorizing Provider  aspirin 81 MG tablet Take 81 mg by mouth daily.    Historical Provider, MD  carvedilol (COREG) 12.5 MG tablet Take 1 tablet (12.5 mg total) by mouth 2 (two) times daily. 09/19/13   Burtis Junes, NP  furosemide (LASIX) 20 MG tablet Take 1 tablet (20 mg total) by mouth daily. 10/20/13   Lelon Perla, MD  guaiFENesin-codeine (ROBITUSSIN AC) 100-10 MG/5ML syrup Take 10 mLs by mouth 4 (four) times daily as needed for cough. 11/22/13   Billy Fischer, MD  ibuprofen (ADVIL,MOTRIN) 200 MG tablet Take 600 mg by mouth every 6 (  six) hours as needed for mild pain.    Historical Provider, MD  losartan (COZAAR) 25 MG tablet Take 1 tablet (25 mg total) by mouth daily. 10/24/13   Lelon Perla, MD  metFORMIN (GLUCOPHAGE) 500 MG tablet Take 500 mg by mouth 2 (two) times daily with a meal.    Historical Provider, MD  minocycline (MINOCIN,DYNACIN) 100 MG capsule Take 1 capsule (100 mg total) by mouth 2 (two) times daily. 11/22/13   Billy Fischer, MD  oxyCODONE-acetaminophen (PERCOCET/ROXICET) 5-325 MG per tablet Take 1 tablet by mouth every 4 (four) hours as needed for severe pain.     Historical Provider, MD   BP 144/90  Pulse 84  Temp(Src) 99 F (37.2 C) (Oral)  Resp 18  SpO2 95% Physical Exam  Nursing note and  vitals reviewed. Constitutional: He is oriented to person, place, and time. He appears well-developed and well-nourished. No distress.  HENT:  Right Ear: External ear normal.  Left Ear: External ear normal.  Nose: Mucosal edema and rhinorrhea present.  Mouth/Throat: Mucous membranes are normal. Posterior oropharyngeal erythema present.  Neck: Normal range of motion. Neck supple.  Cardiovascular: Normal heart sounds.   Pulmonary/Chest: Breath sounds normal.  Lymphadenopathy:    He has no cervical adenopathy.  Neurological: He is alert and oriented to person, place, and time.  Skin: Skin is warm and dry.    ED Course  Procedures (including critical care time) Labs Review Labs Reviewed - No data to display  Imaging Review No results found.   MDM   1. Acute sinusitis        Billy Fischer, MD 11/25/13 1654

## 2013-11-22 NOTE — ED Notes (Signed)
Pt  Reports    Cough          X  4  Days     With         A  Productive  Cough             He  Has  A  History  Of  Bronchitis  And  Is a  Diabetic  As  Well

## 2013-11-22 NOTE — Discharge Instructions (Signed)
Take all of medicine, drink lots of fluids, no more smoking, see your doctor if further problems  °

## 2013-12-12 ENCOUNTER — Encounter (HOSPITAL_COMMUNITY): Payer: Self-pay | Admitting: Pharmacy Technician

## 2013-12-16 ENCOUNTER — Other Ambulatory Visit (INDEPENDENT_AMBULATORY_CARE_PROVIDER_SITE_OTHER): Payer: Self-pay

## 2013-12-16 DIAGNOSIS — I420 Dilated cardiomyopathy: Secondary | ICD-10-CM

## 2013-12-16 DIAGNOSIS — I428 Other cardiomyopathies: Secondary | ICD-10-CM

## 2013-12-16 LAB — BASIC METABOLIC PANEL
BUN: 11 mg/dL (ref 6–23)
CALCIUM: 9.3 mg/dL (ref 8.4–10.5)
CO2: 26 mEq/L (ref 19–32)
Chloride: 98 mEq/L (ref 96–112)
Creatinine, Ser: 1 mg/dL (ref 0.4–1.5)
GFR: 106.99 mL/min (ref >60.00)
Glucose, Bld: 367 mg/dL — ABNORMAL HIGH (ref 70–99)
Potassium: 4 mEq/L (ref 3.5–5.1)
SODIUM: 134 meq/L — AB (ref 135–145)

## 2013-12-16 LAB — CBC WITH DIFFERENTIAL/PLATELET
Basophils Absolute: 0 10*3/uL (ref 0.0–0.1)
Basophils Relative: 0.7 % (ref 0.0–3.0)
Eosinophils Absolute: 0.1 10*3/uL (ref 0.0–0.7)
Eosinophils Relative: 2.7 % (ref 0.0–5.0)
HCT: 45.7 % (ref 39.0–52.0)
HEMOGLOBIN: 15.5 g/dL (ref 13.0–17.0)
LYMPHS PCT: 40.9 % (ref 12.0–46.0)
Lymphs Abs: 2.3 10*3/uL (ref 0.7–4.0)
MCHC: 33.9 g/dL (ref 30.0–36.0)
MCV: 88.6 fl (ref 78.0–100.0)
Monocytes Absolute: 0.5 10*3/uL (ref 0.1–1.0)
Monocytes Relative: 8.3 % (ref 3.0–12.0)
NEUTROS PCT: 47.4 % (ref 43.0–77.0)
Neutro Abs: 2.6 10*3/uL (ref 1.4–7.7)
Platelets: 261 10*3/uL (ref 150.0–400.0)
RBC: 5.15 Mil/uL (ref 4.22–5.81)
RDW: 14.6 % (ref 11.5–15.5)
WBC: 5.6 10*3/uL (ref 4.0–10.5)

## 2013-12-22 ENCOUNTER — Encounter: Payer: Self-pay | Admitting: Cardiology

## 2013-12-22 ENCOUNTER — Ambulatory Visit (INDEPENDENT_AMBULATORY_CARE_PROVIDER_SITE_OTHER): Payer: Self-pay | Admitting: Cardiology

## 2013-12-22 VITALS — BP 118/86 | HR 90 | Ht 73.0 in | Wt 271.7 lb

## 2013-12-22 DIAGNOSIS — E236 Other disorders of pituitary gland: Secondary | ICD-10-CM | POA: Diagnosis not present

## 2013-12-22 DIAGNOSIS — Z7982 Long term (current) use of aspirin: Secondary | ICD-10-CM | POA: Diagnosis not present

## 2013-12-22 DIAGNOSIS — R55 Syncope and collapse: Secondary | ICD-10-CM

## 2013-12-22 DIAGNOSIS — E119 Type 2 diabetes mellitus without complications: Secondary | ICD-10-CM | POA: Diagnosis not present

## 2013-12-22 DIAGNOSIS — I429 Cardiomyopathy, unspecified: Secondary | ICD-10-CM

## 2013-12-22 DIAGNOSIS — E669 Obesity, unspecified: Secondary | ICD-10-CM | POA: Diagnosis not present

## 2013-12-22 DIAGNOSIS — I509 Heart failure, unspecified: Secondary | ICD-10-CM | POA: Diagnosis not present

## 2013-12-22 DIAGNOSIS — Z6834 Body mass index (BMI) 34.0-34.9, adult: Secondary | ICD-10-CM | POA: Diagnosis not present

## 2013-12-22 DIAGNOSIS — I428 Other cardiomyopathies: Secondary | ICD-10-CM

## 2013-12-22 DIAGNOSIS — F17200 Nicotine dependence, unspecified, uncomplicated: Secondary | ICD-10-CM | POA: Diagnosis not present

## 2013-12-22 DIAGNOSIS — I5022 Chronic systolic (congestive) heart failure: Secondary | ICD-10-CM | POA: Diagnosis not present

## 2013-12-22 DIAGNOSIS — R011 Cardiac murmur, unspecified: Secondary | ICD-10-CM | POA: Diagnosis not present

## 2013-12-22 DIAGNOSIS — K219 Gastro-esophageal reflux disease without esophagitis: Secondary | ICD-10-CM | POA: Diagnosis not present

## 2013-12-22 MED ORDER — DEXTROSE 5 % IV SOLN
3.0000 g | INTRAVENOUS | Status: DC
  Filled 2013-12-22: qty 3000

## 2013-12-22 MED ORDER — SODIUM CHLORIDE 0.9 % IR SOLN
80.0000 mg | Status: DC
  Filled 2013-12-22: qty 2

## 2013-12-22 MED ORDER — SPIRONOLACTONE 25 MG PO TABS: 25.0000 mg | ORAL_TABLET | Freq: Every day | ORAL | Status: DC

## 2013-12-22 NOTE — Assessment & Plan Note (Signed)
No recurrent episodes. Life vest in place. For ICD tomorrow.

## 2013-12-22 NOTE — Progress Notes (Signed)
HPI: FU CM. Patient seen 2/15 after falling on ice; sustained a horizontal fracture of the midshaft of the left humerus with medial displacement of the distal fracture fragment. Ortho placed a splint and sling to affected arm. Patient complained of chest pain and x-ray revealed cardiomegaly with mild pulmonary edema. EKG revealed nonspecific ST-T wave changes and troponin's negative. Cardiology was consulted and patient was scheduled for cardiac echo which revealed EF of 20-25% with diffuse hypokinesis, with biatrial enlargement, mild MR and a small pericardial effusion. Patient was discharged on carvedilol 3.125 mg and lisinopril 2.5 mg. TSH in February 2015 normal. Cardiac catheterization in March of 2015 showed normal coronary arteries. Patient seen in followup and was having syncope. A life vest was arranged. Monitor showed sinus with PVCs. Echo 5/15 showed EF 30-35, biatrial enlargement, mildly dilated aortic root. ICD planned; since last seen, He note some fatigue. Occasional palpitations but no syncope. Some dyspnea on exertion and orthopnea.   Current Outpatient Prescriptions  Medication Sig Dispense Refill  . aspirin 81 MG tablet Take 81 mg by mouth daily.      . carvedilol (COREG) 12.5 MG tablet Take 1 tablet (12.5 mg total) by mouth 2 (two) times daily.  60 tablet  3  . furosemide (LASIX) 20 MG tablet Take 1 tablet (20 mg total) by mouth daily.  90 tablet  3  . ibuprofen (ADVIL,MOTRIN) 200 MG tablet Take 600 mg by mouth every 6 (six) hours as needed for mild pain.      Marland Kitchen losartan (COZAAR) 25 MG tablet Take 1 tablet (25 mg total) by mouth daily.  90 tablet  3  . metFORMIN (GLUCOPHAGE) 500 MG tablet Take 500 mg by mouth 2 (two) times daily with a meal.      . oxyCODONE-acetaminophen (PERCOCET/ROXICET) 5-325 MG per tablet Take 1 tablet by mouth every 4 (four) hours as needed for severe pain.        No current facility-administered medications for this visit.     Past Medical  History  Diagnosis Date  . Obesity   . 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 History of renal calculi   . Cardiomyopathy     a. 07/2013 Echo: EF 20-25%, diff HK, Gr2 DD, mild MR, mod dil LA/RA.  Marland Kitchen Fracture of left humerus     a. 07/2013.  . Tobacco abuse   . Chronic bronchitis     "get it most q yr" (08/12/2013)  . Kidney stones   . GERD (gastroesophageal reflux disease)     not at present time  . Heart murmur     "born w/it"   . Type II diabetes mellitus   . Obesity   . Chronic systolic dysfunction of left ventricle   . Syncope     Past Surgical History  Procedure Laterality Date  . Orif humeral shaft fracture Left 08/12/2013  . Cholecystectomy  ~ 2010  . Cardiac catheterization  08/10/13  . Ureteroscopy      "laser for kidney stones"  . Orif humerus fracture Left 08/12/2013    Procedure: OPEN REDUCTION INTERNAL FIXATION (ORIF) HUMERAL SHAFT FRACTURE;  Surgeon: Newt Minion, MD;  Location: [place];  Service: Orthopedics;  Laterality: Left;  Open Reduction Internal Fixation Left Humerus    History   Social History  . Marital Status: Single    Spouse Name: N/A    Number of Children: N/A  . Years of Education:  N/A   Occupational History  . Not on file.   Social History Main Topics  . Smoking status: Current Every Day Smoker -- 0.50 packs/day for 32 years    Types: Cigarettes  . Smokeless tobacco: Never Used  . Alcohol Use: Yes     Comment: 08/12/2013 "might drink a couple times/yr"  . Drug Use: No  . Sexual Activity: Yes   Other Topics Concern  . Not on file   Social History Narrative  . No narrative on file    ROS: no fevers or chills, productive cough, hemoptysis, dysphasia, odynophagia, melena, hematochezia, dysuria, hematuria, rash, seizure activity, orthopnea, PND, pedal edema, claudication. Remaining systems are negative.  Physical Exam: Well-developed well-nourished in no acute distress.  Skin is warm and  dry.  HEENT is normal.  Neck is supple.  Chest is clear to auscultation with normal expansion.  Cardiovascular exam is regular rate and rhythm.  Abdominal exam nontender or distended. No masses palpated. Extremities show no edema. neuro grossly intact  ECG Sinus rhythm at a rate of 90.Left anterior fascicular block. Cannot rule out prior septal infarct.

## 2013-12-22 NOTE — Patient Instructions (Signed)
Your physician recommends that you schedule a follow-up appointment in: [place]  START SPIRONOLACTONE 25 MG ONCE DAILY  Your physician recommends that you return for lab work in: [place]

## 2013-12-22 NOTE — Assessment & Plan Note (Signed)
Patient counseled on discontinuing. 

## 2013-12-22 NOTE — Assessment & Plan Note (Signed)
Continue beta blocker and ARB. Continue present dose of Lasix. Add spironolactone 25 mg daily. Check potassium and renal function in one week. Patient is scheduled for ICD tomorrow.

## 2013-12-23 ENCOUNTER — Ambulatory Visit (HOSPITAL_COMMUNITY)
Admission: RE | Admit: 2013-12-23 | Discharge: 2013-12-24 | Disposition: A | Discharge status: Home / Self Care | Payer: Medicaid Other | Source: Ambulatory Visit | Attending: Internal Medicine | Admitting: Internal Medicine

## 2013-12-23 ENCOUNTER — Encounter (HOSPITAL_COMMUNITY)
Admission: RE | Disposition: A | Discharge status: Home / Self Care | Payer: Self-pay | Source: Ambulatory Visit | Attending: Internal Medicine

## 2013-12-23 ENCOUNTER — Encounter (HOSPITAL_COMMUNITY): Payer: Self-pay | Admitting: General Practice

## 2013-12-23 DIAGNOSIS — I428 Other cardiomyopathies: Secondary | ICD-10-CM | POA: Diagnosis not present

## 2013-12-23 DIAGNOSIS — E236 Other disorders of pituitary gland: Secondary | ICD-10-CM | POA: Insufficient documentation

## 2013-12-23 DIAGNOSIS — E669 Obesity, unspecified: Secondary | ICD-10-CM | POA: Diagnosis not present

## 2013-12-23 DIAGNOSIS — I429 Cardiomyopathy, unspecified: Secondary | ICD-10-CM | POA: Diagnosis present

## 2013-12-23 DIAGNOSIS — E119 Type 2 diabetes mellitus without complications: Secondary | ICD-10-CM | POA: Insufficient documentation

## 2013-12-23 DIAGNOSIS — I509 Heart failure, unspecified: Secondary | ICD-10-CM | POA: Insufficient documentation

## 2013-12-23 DIAGNOSIS — I5022 Chronic systolic (congestive) heart failure: Secondary | ICD-10-CM | POA: Diagnosis not present

## 2013-12-23 DIAGNOSIS — Z7982 Long term (current) use of aspirin: Secondary | ICD-10-CM | POA: Insufficient documentation

## 2013-12-23 DIAGNOSIS — I519 Heart disease, unspecified: Secondary | ICD-10-CM | POA: Diagnosis present

## 2013-12-23 DIAGNOSIS — Z6834 Body mass index (BMI) 34.0-34.9, adult: Secondary | ICD-10-CM | POA: Insufficient documentation

## 2013-12-23 DIAGNOSIS — I420 Dilated cardiomyopathy: Secondary | ICD-10-CM

## 2013-12-23 DIAGNOSIS — R011 Cardiac murmur, unspecified: Secondary | ICD-10-CM | POA: Insufficient documentation

## 2013-12-23 DIAGNOSIS — F17200 Nicotine dependence, unspecified, uncomplicated: Secondary | ICD-10-CM | POA: Insufficient documentation

## 2013-12-23 DIAGNOSIS — K219 Gastro-esophageal reflux disease without esophagitis: Secondary | ICD-10-CM | POA: Insufficient documentation

## 2013-12-23 HISTORY — PX: IMPLANTABLE CARDIOVERTER DEFIBRILLATOR IMPLANT: SHX5860

## 2013-12-23 HISTORY — DX: Essential (primary) hypertension: I10

## 2013-12-23 HISTORY — PX: IMPLANTABLE CARDIOVERTER DEFIBRILLATOR IMPLANT: SHX5473

## 2013-12-23 LAB — GLUCOSE, CAPILLARY
GLUCOSE-CAPILLARY: 294 mg/dL — AB (ref 70–99)
GLUCOSE-CAPILLARY: 243 mg/dL — AB (ref 70–99)
Glucose-Capillary: 329 mg/dL — ABNORMAL HIGH (ref 70–99)
Glucose-Capillary: 319 mg/dL — ABNORMAL HIGH (ref 70–99)
Glucose-Capillary: 298 mg/dL — ABNORMAL HIGH (ref 70–99)
Glucose-Capillary: 271 mg/dL — ABNORMAL HIGH (ref 70–99)

## 2013-12-23 LAB — SURGICAL PCR SCREEN
MRSA, PCR: NEGATIVE
Staphylococcus aureus: NEGATIVE

## 2013-12-23 SURGERY — IMPLANTABLE CARDIOVERTER DEFIBRILLATOR IMPLANT
Anesthesia: LOCAL

## 2013-12-23 MED ORDER — MIDAZOLAM HCL 5 MG/5ML IJ SOLN
INTRAMUSCULAR | Status: AC
  Filled 2013-12-23: qty 5

## 2013-12-23 MED ORDER — INSULIN ASPART 100 UNIT/ML ~~LOC~~ SOLN
0.0000 [IU] | Freq: Every day | SUBCUTANEOUS | Status: DC
  Administered 2013-12-23: 3 [IU] via SUBCUTANEOUS

## 2013-12-23 MED ORDER — CARVEDILOL 12.5 MG PO TABS
12.5000 mg | ORAL_TABLET | Freq: Two times a day (BID) | ORAL | Status: DC
  Administered 2013-12-23 – 2013-12-24 (×3): 12.5 mg via ORAL
  Filled 2013-12-23 (×4): qty 1

## 2013-12-23 MED ORDER — HEPARIN (PORCINE) IN NACL 2-0.9 UNIT/ML-% IJ SOLN
INTRAMUSCULAR | Status: AC
  Filled 2013-12-23: qty 500

## 2013-12-23 MED ORDER — CHLORHEXIDINE GLUCONATE 4 % EX LIQD
60.0000 mL | Freq: Once | CUTANEOUS | Status: DC
  Filled 2013-12-23: qty 60

## 2013-12-23 MED ORDER — INSULIN ASPART 100 UNIT/ML ~~LOC~~ SOLN
SUBCUTANEOUS | Status: AC
  Filled 2013-12-23: qty 1

## 2013-12-23 MED ORDER — ONDANSETRON HCL 4 MG/2ML IJ SOLN: 4.0000 mg | Freq: Four times a day (QID) | INTRAMUSCULAR | Status: DC | PRN

## 2013-12-23 MED ORDER — SPIRONOLACTONE 25 MG PO TABS
25.0000 mg | ORAL_TABLET | Freq: Every day | ORAL | Status: DC
  Administered 2013-12-23 – 2013-12-24 (×2): 25 mg via ORAL
  Filled 2013-12-23 (×2): qty 1

## 2013-12-23 MED ORDER — MUPIROCIN 2 % EX OINT
TOPICAL_OINTMENT | Freq: Two times a day (BID) | CUTANEOUS | Status: DC
  Administered 2013-12-23: 06:00:00 via NASAL
  Filled 2013-12-23: qty 22

## 2013-12-23 MED ORDER — LOSARTAN POTASSIUM 25 MG PO TABS
25.0000 mg | ORAL_TABLET | Freq: Every day | ORAL | Status: DC
  Administered 2013-12-23 – 2013-12-24 (×2): 25 mg via ORAL
  Filled 2013-12-23 (×2): qty 1

## 2013-12-23 MED ORDER — SODIUM CHLORIDE 0.9 % IV SOLN
INTRAVENOUS | Status: DC
  Administered 2013-12-23: 06:00:00 via INTRAVENOUS

## 2013-12-23 MED ORDER — ACETAMINOPHEN 325 MG PO TABS: 325.0000 mg | ORAL_TABLET | ORAL | Status: DC | PRN

## 2013-12-23 MED ORDER — FUROSEMIDE 20 MG PO TABS
20.0000 mg | ORAL_TABLET | Freq: Every day | ORAL | Status: DC
  Administered 2013-12-23 – 2013-12-24 (×2): 20 mg via ORAL
  Filled 2013-12-23 (×2): qty 1

## 2013-12-23 MED ORDER — CEFAZOLIN SODIUM 1-5 GM-% IV SOLN
1.0000 g | Freq: Four times a day (QID) | INTRAVENOUS | Status: AC
  Administered 2013-12-23 – 2013-12-24 (×3): 1 g via INTRAVENOUS
  Filled 2013-12-23 (×3): qty 50

## 2013-12-23 MED ORDER — LIDOCAINE HCL (PF) 1 % IJ SOLN
INTRAMUSCULAR | Status: AC
  Filled 2013-12-23: qty 30

## 2013-12-23 MED ORDER — YOU HAVE A PACEMAKER BOOK
Freq: Once | Status: AC
  Administered 2013-12-24
  Filled 2013-12-23: qty 1

## 2013-12-23 MED ORDER — SODIUM CHLORIDE 0.9 % IJ SOLN
3.0000 mL | Freq: Two times a day (BID) | INTRAMUSCULAR | Status: DC
  Administered 2013-12-24: 3 mL via INTRAVENOUS

## 2013-12-23 MED ORDER — SODIUM CHLORIDE 0.9 % IV SOLN: 250.0000 mL | INTRAVENOUS | Status: DC | PRN

## 2013-12-23 MED ORDER — FENTANYL CITRATE 0.05 MG/ML IJ SOLN
INTRAMUSCULAR | Status: AC
  Filled 2013-12-23: qty 2

## 2013-12-23 MED ORDER — OXYCODONE-ACETAMINOPHEN 5-325 MG PO TABS
1.0000 | ORAL_TABLET | ORAL | Status: DC | PRN
  Administered 2013-12-23 – 2013-12-24 (×4): 1 via ORAL
  Filled 2013-12-23 (×5): qty 1

## 2013-12-23 MED ORDER — MUPIROCIN 2 % EX OINT
TOPICAL_OINTMENT | CUTANEOUS | Status: AC
  Filled 2013-12-23: qty 22

## 2013-12-23 MED ORDER — INSULIN ASPART 100 UNIT/ML ~~LOC~~ SOLN
7.0000 [IU] | Freq: Once | SUBCUTANEOUS | Status: AC
  Administered 2013-12-23: 7 [IU] via SUBCUTANEOUS

## 2013-12-23 MED ORDER — SODIUM CHLORIDE 0.9 % IJ SOLN: 3.0000 mL | INTRAMUSCULAR | Status: DC | PRN

## 2013-12-23 MED ORDER — INSULIN ASPART 100 UNIT/ML ~~LOC~~ SOLN
0.0000 [IU] | Freq: Three times a day (TID) | SUBCUTANEOUS | Status: DC
  Administered 2013-12-23: 11 [IU] via SUBCUTANEOUS
  Administered 2013-12-23: 5 [IU] via SUBCUTANEOUS
  Administered 2013-12-24: 8 [IU] via SUBCUTANEOUS

## 2013-12-23 MED ORDER — LIDOCAINE HCL (PF) 1 % IJ SOLN
INTRAMUSCULAR | Status: AC
Start: 2013-12-23 — End: 2013-12-23
  Filled 2013-12-23: qty 60

## 2013-12-23 NOTE — H&P (Addendum)
Primary Care Physician: Noralee Space, MD  Referring Physician: Dr Kenyon Ana is a 48 y.o. male with a h/o a nonischemic CM, NYHA Class III CHF, and obesity who presents for ICD implantation for syncope and reduced EF. He initially presented 2/15 following a humeral fracture. At that time, he was found to have cardiomegaly on CXR and an EF of 20-25% by echo. He underwent cath in March of 2015 which showed normal coronary arteries. He was evaluated by Dr Stanford Breed and has been treated with an optimal medical regimen since that time. Despite optimal medical therapy, he has a persistently depressed EF. He has also had 2 episodes of abrupt onset syncope. This was felt to likely be arrhythmogenic in nature and a LifeVest was placed. The patient continues to wear his lifevest and has had no further syncope. He worn an event monitor which revealed sinus rhythm with PVCS.  Presently, he reports SOB with moderate activity. He has occasional SOB at rest as well as 2 pillow orthopnea. He has mild BLE edema.  Today, he denies symptoms of palpitations, chest pain, dizziness, presyncope, syncope, or neurologic sequela. The patient is tolerating medications without difficulties and is otherwise without complaint today.   Past Medical History   Diagnosis  Date   .  Obesity    .  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  History of renal calculi    .  Cardiomyopathy      a. 07/2013 Echo: EF 20-25%, diff HK, Gr2 DD, mild MR, mod dil LA/RA.   Marland Kitchen  Fracture of left humerus      a. 07/2013.   .  Tobacco abuse    .  Chronic bronchitis      "get it most q yr" (08/12/2013)   .  Kidney stones    .  GERD (gastroesophageal reflux disease)      not at present time   .  Heart murmur      "born w/it"   .  Type II diabetes mellitus    .  Obesity    .  Chronic systolic dysfunction of left ventricle    .  Syncope     Past Surgical History   Procedure   Laterality  Date   .  Orif humeral shaft fracture  Left  08/12/2013   .  Cholecystectomy   ~ 2010   .  Cardiac catheterization   08/10/13   .  Ureteroscopy       "laser for kidney stones"   .  Orif humerus fracture  Left  08/12/2013     Procedure: OPEN REDUCTION INTERNAL FIXATION (ORIF) HUMERAL SHAFT FRACTURE; Surgeon: Newt Minion, MD; Location: [facility]; Service: Orthopedics; Laterality: Left; Open Reduction Internal Fixation Left Humerus    Current Outpatient Prescriptions   Medication  Sig  Dispense  Refill   .  aspirin 81 MG tablet  Take 81 mg by mouth daily.     .  carvedilol (COREG) 12.5 MG tablet  Take 1 tablet (12.5 mg total) by mouth 2 (two) times daily.  60 tablet  3   .  furosemide (LASIX) 20 MG tablet  Take 1 tablet (20 mg total) by mouth daily.  90 tablet  3   .  ibuprofen (ADVIL,MOTRIN) 200 MG tablet  Take 600 mg by mouth every 6 (six) hours as needed for mild pain.     Marland Kitchen  losartan (COZAAR) 25  MG tablet  Take 1 tablet (25 mg total) by mouth daily.  90 tablet  3   .  metFORMIN (GLUCOPHAGE) 500 MG tablet  Take 500 mg by mouth 2 (two) times daily with a meal.     .  oxyCODONE-acetaminophen (PERCOCET/ROXICET) 5-325 MG per tablet  Take 1 tablet by mouth every 4 (four) hours as needed for severe pain.      No current facility-administered medications for this visit.    Allergies   Allergen  Reactions   .  Bee Venom  Anaphylaxis    History    Social History   .  Marital Status:  Single     Spouse Name:  N/A     Number of Children:  N/A   .  Years of Education:  N/A    Occupational History   .  Not on file.    Social History Main Topics   .  Smoking status:  Current Every Day Smoker -- 0.50 packs/day for 32 years     Types:  Cigarettes   .  Smokeless tobacco:  Never Used   .  Alcohol Use:  Yes      Comment: 08/12/2013 "might drink a couple times/yr"   .  Drug Use:  No   .  Sexual Activity:  Yes    Other Topics  Concern   .  Not on file    Social History Narrative   .  No  narrative on file    Family History   Problem  Relation  Age of Onset   .  Diabetes  Father    .  Arthritis  Father    .  Diabetes  Mother    .  Arthritis  Mother    .  Hypertension  Brother    ROS- All systems are reviewed and negative except as per the HPI above  Physical Exam:  Filed Vitals:   12/23/13 0542  BP: 142/96  Pulse: 104  Temp: 98.3 F (36.8 C)  Resp: 20    GEN- The patient is overweight but pleasant appearing,  Head- normocephalic, atraumatic  Eyes- Sclera clear, conjunctiva pink  Ears- hearing intact  Oropharynx- clear  Neck- supple, JVP difficult to assess  Lungs- Clear to ausculation bilaterally, normal work of breathing  Heart- Regular rate and rhythm, no murmurs, rubs or gallops, laterally displaced pmi  GI- soft, NT, ND, + BS  Extremities- no clubbing, cyanosis, trace edema  MS- no significant deformity or atrophy  Skin- no rash or lesion  Psych- euthymic mood, full affect  Neuro- strength and sensation are intact    Assessment and Plan:  1. Nonischemic CM/ chronic systolic dysfunction/ syncope  The patient has a nonischemic CM (EF 30%), NYHA Class III CHF, and syncope. At this time, he meets criteria for ICD implantation for secondary prevention of syncope/ sudden death. He has been treated with an optimal medical therapy > 90 days but continues to have a depressed EF. He has a narrow QRS and therefore does not meet criteria for CRT. Risks, benefits, alternatives to ICD implantation were discussed in detail with the patient today. The patient understands that the risks include but are not limited to bleeding, infection, pneumothorax, perforation, tamponade, vascular damage, renal failure, MI, stroke, death, inappropriate shocks, and lead dislodgement and wishes to proceed.    ICD Criteria  Current LVEF:30% ;Obtained > 3 months ago and < or = 6 months ago.   NYHA Functional Classification: Class III  Heart Failure History:  Yes, Duration of heart  failure since onset is 3 to 9 months  Non-Ischemic Dilated Cardiomyopathy History:  No.  Atrial Fibrillation/Atrial Flutter:  No.   HE HAS HAD SYNCOPE--> implant today for secondary prevention given arrhythmiogenic syncope  Ventricular Tachycardia History:  No.  Cardiac Arrest History:  No  History of Syndromes with Risk of Sudden Death:  No.  Previous ICD:  No.  Electrophysiology Study: No.  Prior MI: No.  PPM: No.  OSA:  No  Patient Life Expectancy of >=1 year: Yes.  Anticoagulation Therapy:  Patient is NOT on anticoagulation therapy.   Beta Blocker Therapy:  Yes.   Ace Inhibitor/ARB Therapy:  Yes.

## 2013-12-23 NOTE — Discharge Summary (Addendum)
ELECTROPHYSIOLOGY PROCEDURE DISCHARGE SUMMARY    Patient ID: [name],  MRN: [id number], DOB/AGE: 1965-12-28 48 y.o.  Admit date: 12/23/2013 Discharge date: 12/23/2013  Primary Care Physician: Noralee Space, MD Primary Cardiologist: Stanford Breed Electrophysiologist: Allred  Primary Discharge Diagnosis:  Non ischemic cardiomyopathy, class III congestive heart failure, syncope status post ICD implantation this admission  Secondary Discharge Diagnosis:  1.  Obesity 2.  Diabetes 3.  Tobacco abuse   Allergies  Allergen Reactions  . Bee Venom Anaphylaxis     Procedures This Admission:  1.  Implantation of a STJ single chamber ICD on 12-23-13 by [name].  The patient received a STJ model number [id number] ICD with model number [id number] right ventricular lead.  DFT's were successful at 40 J.  There were no immediate post procedure complications. 2.  CXR on 7/18 demonstrated no pneumothorax status post device implantation.   Brief HPI: [name] is a 48 y.o. male was referred to electrophysiology in the outpatient setting for consideration of ICD implantation.  Past medical history includes non ischemic cardiomyopathy, congestive heart failure, and syncope.  The patient has persistent LV dysfunction despite guideline directed therapy.  Risks, benefits, and alternatives to ICD implantation were reviewed with the patient who wished to proceed.   Hospital Course:  The patient was admitted and underwent implantation of a STJ single chamber ICD with details as outlined above.   He was monitored on telemetry overnight which demonstrated nsr.  Left chest was without hematoma or ecchymosis.  The device was interrogated and found to be functioning normally.  CXR was obtained and demonstrated no pneumothorax status post device implantation.  Wound care, arm mobility, and restrictions were reviewed with the patient.  He did c/o incisional soreness. Dr Lovena Le examined the patient and considered  them stable for discharge to home. His device was working normally on interogation.  The patient's discharge medications include an ARB (Losartan) and beta blocker (Carvedilol).   Discharge Vitals: Blood pressure 138/73, pulse 60, temperature 98.3 F (36.8 C), temperature source Oral, resp. rate 20, height 6\' 1"  (1.854 m), weight 260 lb (117.935 kg), SpO2 95.00%.   Labs:   Lab Results  Component Value Date   WBC 5.6 12/16/2013   HGB 15.5 12/16/2013   HCT 45.7 12/16/2013   MCV 88.6 12/16/2013   PLT 261.0 12/16/2013   No results found for this basename: NA, K, CL, CO2, BUN, CREATININE, CALCIUM, LABALBU, PROT, BILITOT, ALKPHOS, ALT, AST, GLUCOSE,  in the last 168 hours   Discharge Medications:    Medication List    ASK your doctor about these medications       aspirin 81 MG tablet  Take 81 mg by mouth daily.     carvedilol 12.5 MG tablet  Commonly known as:  COREG  Take 1 tablet (12.5 mg total) by mouth 2 (two) times daily.     furosemide 20 MG tablet  Commonly known as:  LASIX  Take 1 tablet (20 mg total) by mouth daily.     ibuprofen 200 MG tablet  Commonly known as:  ADVIL,MOTRIN  Take 600 mg by mouth every 6 (six) hours as needed for mild pain.     losartan 25 MG tablet  Commonly known as:  COZAAR  Take 1 tablet (25 mg total) by mouth daily.     metFORMIN 500 MG tablet  Commonly known as:  GLUCOPHAGE  Take 500 mg by mouth 2 (two) times daily with a meal.  oxyCODONE-acetaminophen 5-325 MG per tablet  Commonly known as:  PERCOCET/ROXICET  Take 1 tablet by mouth every 4 (four) hours as needed for severe pain.     spironolactone 25 MG tablet  Commonly known as:  ALDACTONE  Take 1 tablet (25 mg total) by mouth daily.        Disposition:     Duration of Discharge Encounter: less than 30 minutes including physician time.  Signed, Mikle Bosworth.D

## 2013-12-23 NOTE — Op Note (Signed)
SURGEON:  Thompson Grayer, MD      PREPROCEDURE DIAGNOSES:   1. Nonischemic cardiomyopathy.   2. New York Heart Association class III, heart failure chronically.   3. Syncope     POSTPROCEDURE DIAGNOSES:   1. Nonischemic cardiomyopathy.   2. New York Heart Association class III, heart failure chronically.   3. Syncope     PROCEDURES:    1. ICD implantation.  2. Defibrillation threshold testing     INTRODUCTION: [name] is a 48 y.o. male with a nonischemic CM (EF 30%), NYHA Class III CHF, and syncope. At this time, he meets criteria for ICD implantation for secondary prevention treatment of syncope.  The patient has a narrow QRS and does not meet criteria for revascularization.  The patient has been treated with an optimal medical regimen while wearing a Lifevest but continues to have a depressed ejection fraction and NYHA Class III CHF symptoms.  The patient therefore  presents today for ICD implantation.      DESCRIPTION OF PROCEDURE:  Informed written consent was obtained and the patient was brought to the electrophysiology lab in the fasting state. The patient was adequately sedated with intravenous Versed, and fentanyl as outlined in the nursing report.  The patient's left chest was prepped and draped in the usual sterile fashion by the EP lab staff.  The skin overlying the left deltopectoral region was infiltrated with lidocaine for local analgesia.  A 5-cm incision was made over the left deltopectoral region.  A left subcutaneous defibrillator pocket was fashioned using a combination of sharp and blunt dissection.  Electrocautery was used to assure hemostasis.   Left Upper extremity Venography:  A venogram of the left upper extremity was performed which revealed a large sized left axillary vein which emptied into a large sized left subclavian vein.    RV Lead Placement: The left axillary vein was cannulated with fluoroscopic visualization.  Through the left axillary vein, a [employer], model 7122Q-65 (serial number [id number]) right ventricular defibrillator lead were advanced with fluoroscopic visualization into the right ventricular apex position.  Initial right ventricular lead R-wave measured 17 mV with impedance of 635 ohms and a threshold of 0.7 volts at 0.5 milliseconds.   The lead was secured to the pectoralis  fascia using #2 silk suture over the suture sleeve.  The pocket then  irrigated with copious gentamicin solution.  The lead was then  connected to a [employer] VR model CD1357-40Q (serial  Number [id number]) ICD.  The defibrillator was placed into the  pocket.  The pocket was then closed in 2 layers with 2.0 Vicryl suture  for the subcutaneous and subcuticular layers.  Steri-Strips and a  sterile dressing were then applied.   DFT Testing: Defibrillation Threshold testing was then performed. Ventricular fibrillation was induced with a T shock.  Adequate sensing of ventricular  fibrillation was observed with minimal dropout with a programmed sensitivity of 1.66mV.  The patient was successfully defibrillated to sinus rhythm with a single 20 joules shock delivered from the device with an impedance of 70 ohms in a duration of 6 seconds.  The patient remained in sinus rhythm thereafter.  There were no early apparent complications.  Programmed Extrastimulus testing:  Programmed extrastimulus testing was performed through the device with a basic cycle length of 578msec with S1,S2,S3,S4 extrastimuli down to refractoriness (500/230/220/200 msec) with no sustained VT or VF observed.  The procedure was therefore considered completed.  There were  no early apparhent complications.     CONCLUSIONS:   1. nonschemic cardiomyopathy with chronic New York Heart Association class III heart failure and syncope.   2. Successful ICD implantation.   3. DFT less than or equal to 20 joules.   4. No inducible VT or VF with PES  5. No early apparent  complications.

## 2013-12-23 NOTE — Progress Notes (Signed)
UR Completed Dailyn Kempner Graves-Bigelow, RN,BSN [phone]  

## 2013-12-24 ENCOUNTER — Ambulatory Visit (HOSPITAL_COMMUNITY): Payer: Medicaid Other

## 2013-12-24 DIAGNOSIS — I5022 Chronic systolic (congestive) heart failure: Secondary | ICD-10-CM | POA: Diagnosis not present

## 2013-12-24 DIAGNOSIS — I428 Other cardiomyopathies: Secondary | ICD-10-CM | POA: Diagnosis not present

## 2013-12-24 DIAGNOSIS — I509 Heart failure, unspecified: Secondary | ICD-10-CM | POA: Diagnosis not present

## 2013-12-24 DIAGNOSIS — E669 Obesity, unspecified: Secondary | ICD-10-CM | POA: Diagnosis not present

## 2013-12-24 LAB — BASIC METABOLIC PANEL
ANION GAP: 14 (ref 5–15)
BUN: 9 mg/dL (ref 6–23)
CALCIUM: 8.7 mg/dL (ref 8.4–10.5)
CO2: 24 meq/L (ref 19–32)
CREATININE: 0.59 mg/dL (ref 0.50–1.35)
Chloride: 96 mEq/L (ref 96–112)
GFR calc non Af Amer: >90 mL/min (ref >90)
Glucose, Bld: 236 mg/dL — ABNORMAL HIGH (ref 70–99)
Potassium: 4.1 mEq/L (ref 3.7–5.3)
SODIUM: 134 meq/L — AB (ref 137–147)

## 2013-12-24 LAB — GLUCOSE, CAPILLARY
Glucose-Capillary: 281 mg/dL — ABNORMAL HIGH (ref 70–99)
Glucose-Capillary: 318 mg/dL — ABNORMAL HIGH (ref 70–99)

## 2013-12-24 MED ORDER — OXYCODONE-ACETAMINOPHEN 5-325 MG PO TABS: 1.0000 | ORAL_TABLET | ORAL | Status: DC | PRN

## 2013-12-24 NOTE — Discharge Instructions (Signed)
° ° °  Supplemental Discharge Instructions for  Pacemaker/Defibrillator Patients  Activity No heavy lifting or vigorous activity with your left/right arm for 6 to 8 weeks.  Do not raise your left/right arm above your head for one week.  Gradually raise your affected arm as drawn below.                     7/19                  7/20                   7/21                             7/22 __  NO DRIVING for   1 week  ; you may begin driving on  [id number]  WOUND CARE   Keep the wound area clean and dry.  Do not get this area wet for one week. No showers for one week; you may shower on     .   The tape/steri-strips on your wound will fall off; do not pull them off.  No bandage is needed on the site.  DO  NOT apply any creams, oils, or ointments to the wound area.   If you notice any drainage or discharge from the wound, any swelling or bruising at the site, or you develop a fever > 101? F after you are discharged home, call the office at once.  Special Instructions   You are still able to use cellular telephones; use the ear opposite the side where you have your pacemaker/defibrillator.  Avoid carrying your cellular phone near your device.   When traveling through airports, show security personnel your identification card to avoid being screened in the metal detectors.  Ask the security personnel to use the hand wand.   Avoid arc welding equipment, MRI testing (magnetic resonance imaging), TENS units (transcutaneous nerve stimulators).  Call the office for questions about other devices.   Avoid electrical appliances that are in poor condition or are not properly grounded.   Microwave ovens are safe to be near or to operate.  Additional information for defibrillator patients should your device go off:   If your device goes off ONCE and you feel fine afterward, notify the device clinic nurses.   If your device goes off ONCE and you do not feel well afterward, call 911.   If your device goes off  TWICE, call 911.   If your device goes off THREE times in one day, call 911.  DO NOT DRIVE YOURSELF OR A FAMILY MEMBER WITH A DEFIBRILLATOR TO THE HOSPITAL--CALL 911.

## 2013-12-28 ENCOUNTER — Telehealth: Payer: Self-pay | Admitting: Internal Medicine

## 2013-12-28 NOTE — Telephone Encounter (Signed)
°  Patient called in her had de-fib put and he says his chest feels uncomfortable. He denies chest pain. Please call and advise.

## 2013-12-29 NOTE — Telephone Encounter (Signed)
Per pt---chest pain is feeling much better today. Pt has wound check appt on 01-04-14.

## 2013-12-31 ENCOUNTER — Encounter (HOSPITAL_COMMUNITY): Payer: Self-pay | Admitting: *Deleted

## 2014-01-04 ENCOUNTER — Ambulatory Visit (INDEPENDENT_AMBULATORY_CARE_PROVIDER_SITE_OTHER): Payer: Self-pay | Admitting: *Deleted

## 2014-01-04 DIAGNOSIS — I428 Other cardiomyopathies: Secondary | ICD-10-CM

## 2014-01-04 LAB — MDC_IDC_ENUM_SESS_TYPE_INCLINIC
Battery Remaining Longevity: 102 mo
HighPow Impedance: 63 Ohm
Lead Channel Pacing Threshold Amplitude: 0.5 V
Lead Channel Pacing Threshold Amplitude: 0.5 V
Lead Channel Pacing Threshold Pulse Width: 0.5 ms
Lead Channel Sensing Intrinsic Amplitude: 12 mV
Lead Channel Setting Pacing Amplitude: 3.5 V
Lead Channel Setting Pacing Pulse Width: 0.5 ms
Lead Channel Setting Sensing Sensitivity: 0.5 mV
MDC IDC MSMT LEADCHNL RV IMPEDANCE VALUE: 400 Ohm
MDC IDC MSMT LEADCHNL RV PACING THRESHOLD PULSEWIDTH: 0.5 ms
MDC IDC PG SERIAL: 7196009
MDC IDC SESS DTM: 20150729162840
MDC IDC SET ZONE DETECTION INTERVAL: 300 ms
MDC IDC STAT BRADY RV PERCENT PACED: 0 %
Zone Setting Detection Interval: 250 ms
Zone Setting Detection Interval: 345 ms

## 2014-01-04 NOTE — Progress Notes (Signed)
Wound check appointment. Steri-strips removed. Wound without redness or edema. Incision edges approximated, wound well healed. Normal device function. Thresholds, sensing, and impedances consistent with implant measurements. Device programmed at 3.5V for extra safety margin until 3 month visit. Histogram distribution appropriate for patient and level of activity. No mode switches or ventricular arrhythmias noted. Patient educated about wound care, arm mobility, lifting restrictions, shock plan. ROV in 3 months with implanting physician. ? ? ?

## 2014-01-13 ENCOUNTER — Telehealth: Payer: Self-pay | Admitting: Cardiology

## 2014-01-13 MED ORDER — METFORMIN HCL 500 MG PO TABS: 500.0000 mg | ORAL_TABLET | Freq: Two times a day (BID) | ORAL | Status: DC

## 2014-01-13 NOTE — Telephone Encounter (Signed)
Returned call to patient. He c/o diarrhea and stomach cramping x2 days and had chills yesterday. He was given Rx for sprinolactone at last OV with [name] but just started on it 3-4 days ago. He reports the SE listed in the Rx info lists diarrhea and cramping. He reports he does not feel dehydrated and has been drinking plenty of fluids. He recently had an ICD implant and does reports some chest pain, but states this is improving.   He also requests a refill of metformin, as he states he was put on this in hospital and is out and does not yet have PCP. Told him 1 month supply could be sent in but he would need to find a PCP.  Message forwarded to Loreta Ave, PharmD to review.

## 2014-01-13 NOTE — Telephone Encounter (Signed)
Spoke with patient, states has had abdominal cramping, diarrhea and chills, but not necessarily all at same time, or with each dose.  Unsure if medication or stomach virus.  I advised he cut dose to 1/2 tablet and take with food.  Try for 4-5 days, and if no improvement, to call office for Dr. Jacalyn Lefevre opinion.  Pt willing to try, voiced understanding.

## 2014-01-13 NOTE — Telephone Encounter (Signed)
New message     Pt is having stomach cramps.  Could it be from his medication?

## 2014-01-20 ENCOUNTER — Encounter: Payer: Self-pay | Admitting: Internal Medicine

## 2014-03-14 ENCOUNTER — Encounter (HOSPITAL_COMMUNITY): Payer: Self-pay | Admitting: Emergency Medicine

## 2014-03-14 ENCOUNTER — Emergency Department (INDEPENDENT_AMBULATORY_CARE_PROVIDER_SITE_OTHER)
Admission: EM | Admit: 2014-03-14 | Discharge: 2014-03-14 | Disposition: A | Discharge status: Home / Self Care | Payer: Self-pay | Source: Home / Self Care

## 2014-03-14 DIAGNOSIS — J4530 Mild persistent asthma, uncomplicated: Secondary | ICD-10-CM

## 2014-03-14 MED ORDER — GUAIFENESIN-CODEINE 100-10 MG/5ML PO SYRP: 10.0000 mL | ORAL_SOLUTION | Freq: Four times a day (QID) | ORAL | Status: DC | PRN

## 2014-03-14 MED ORDER — TRIAMCINOLONE ACETONIDE 40 MG/ML IJ SUSP
40.0000 mg | Freq: Once | INTRAMUSCULAR | Status: AC
  Administered 2014-03-14: 40 mg via INTRAMUSCULAR

## 2014-03-14 MED ORDER — TRIAMCINOLONE ACETONIDE 40 MG/ML IJ SUSP
INTRAMUSCULAR | Status: AC
  Filled 2014-03-14: qty 1

## 2014-03-14 MED ORDER — MINOCYCLINE HCL 100 MG PO CAPS: 100.0000 mg | ORAL_CAPSULE | Freq: Two times a day (BID) | ORAL | Status: DC

## 2014-03-14 NOTE — Discharge Instructions (Signed)
Take all of medicine, drink lots of fluids, no more smoking, see your doctor if further problems  °

## 2014-03-14 NOTE — ED Provider Notes (Signed)
CSN: [id number]     Arrival date & time 03/14/14  1251 History   None    Chief Complaint  Patient presents with  . Nasal Congestion   (Consider location/radiation/quality/duration/timing/severity/associated sxs/prior Treatment) Patient is a 48 y.o. male presenting with cough. The history is provided by the patient.  Cough Cough characteristics:  Productive and hacking Sputum characteristics:  [place] Severity:  Moderate Onset quality:  Gradual Duration:  3 days Chronicity:  New Smoker: no   Relieved by:  None tried Worsened by:  Nothing tried Ineffective treatments:  None tried Associated symptoms: no fever, no shortness of breath and no wheezing     Past Medical History  Diagnosis Date  . Obesity   . 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 History of renal calculi   . Cardiomyopathy     a. 07/2013 Echo: EF 20-25%, diff HK, Gr2 DD, mild MR, mod dil LA/RA.  Marland Kitchen Fracture of left humerus     a. 07/2013.  Marland Kitchen Chronic bronchitis     "get it most q yr" (12/23/2013)  . Kidney stones   . GERD (gastroesophageal reflux disease)     not at present time  . Heart murmur     "born w/it"   . Type II diabetes mellitus   . Obesity   . Chronic systolic dysfunction of left ventricle   . Syncope   . Hypertension    Past Surgical History  Procedure Laterality Date  . Cholecystectomy  ~ 2010  . Cardiac catheterization  08/10/13  . Ureteroscopy      "laser for kidney stones"  . Orif humerus fracture Left 08/12/2013    Procedure: OPEN REDUCTION INTERNAL FIXATION (ORIF) HUMERAL SHAFT FRACTURE;  Surgeon: Newt Minion, MD;  Location: [facility];  Service: Orthopedics;  Laterality: Left;  Open Reduction Internal Fixation Left Humerus  . Implantable cardioverter defibrillator implant  12/23/2013    STJ Fortify ICD implanted by [name] for cardiomyopathy and syncope   Family History  Problem Relation Age of Onset  . Diabetes Father   . Arthritis Father    . Diabetes Mother   . Arthritis Mother   . Hypertension Brother    History  Substance Use Topics  . Smoking status: Current Every Day Smoker -- 1.00 packs/day for 28 years    Types: Cigarettes  . Smokeless tobacco: Never Used  . Alcohol Use: Yes     Comment: 12/23/2013 "might drink a couple times/yr"    Review of Systems  Constitutional: Negative.  Negative for fever.  HENT: Positive for congestion.   Respiratory: Positive for cough. Negative for shortness of breath and wheezing.     Allergies  Bee venom  Home Medications   Prior to Admission medications   Medication Sig Start Date End Date Taking? Authorizing Provider  aspirin 81 MG tablet Take 81 mg by mouth daily.   Yes Historical Provider, MD  carvedilol (COREG) 12.5 MG tablet Take 1 tablet (12.5 mg total) by mouth 2 (two) times daily. 09/19/13   Burtis Junes, NP  diazepam (VALIUM) 5 MG tablet Take 5 mg by mouth every 12 (twelve) hours as needed for anxiety.    Historical Provider, MD  furosemide (LASIX) 20 MG tablet Take 1 tablet (20 mg total) by mouth daily. 10/20/13   Lelon Perla, MD  guaiFENesin-codeine (ROBITUSSIN AC) 100-10 MG/5ML syrup Take 10 mLs by mouth 4 (four) times daily as needed for cough. 03/14/14  Billy Fischer, MD  ibuprofen (ADVIL,MOTRIN) 200 MG tablet Take 600 mg by mouth every 6 (six) hours as needed for mild pain.    Historical Provider, MD  losartan (COZAAR) 25 MG tablet Take 1 tablet (25 mg total) by mouth daily. 10/24/13   Lelon Perla, MD  metFORMIN (GLUCOPHAGE) 500 MG tablet Take 1 tablet (500 mg total) by mouth 2 (two) times daily with a meal. <future refills should be sent to PCP> 01/13/14   Lelon Perla, MD  minocycline (MINOCIN,DYNACIN) 100 MG capsule Take 1 capsule (100 mg total) by mouth 2 (two) times daily. 03/14/14   Billy Fischer, MD  oxyCODONE-acetaminophen (PERCOCET/ROXICET) 5-325 MG per tablet Take 1 tablet by mouth every 4 (four) hours as needed for severe pain. 12/24/13    Eileen Stanford, PA-C  spironolactone (ALDACTONE) 25 MG tablet Take 1 tablet (25 mg total) by mouth daily. 12/22/13   Lelon Perla, MD   BP 128/84  Pulse 81  Temp(Src) 98.3 F (36.8 C) (Oral)  Resp 12  SpO2 98% Physical Exam  Nursing note and vitals reviewed. Constitutional: He is oriented to person, place, and time. He appears well-developed and well-nourished. No distress.  HENT:  Head: Normocephalic.  Right Ear: External ear normal.  Left Ear: External ear normal.  Mouth/Throat: Oropharynx is clear and moist.  Eyes: Conjunctivae and EOM are normal. Pupils are equal, round, and reactive to light.  Neck: Normal range of motion. Neck supple.  Cardiovascular: Normal rate, normal heart sounds and intact distal pulses.   Pulmonary/Chest: Effort normal and breath sounds normal.  Lymphadenopathy:    He has no cervical adenopathy.  Neurological: He is alert and oriented to person, place, and time.  Skin: Skin is warm and dry.    ED Course  Procedures (including critical care time) Labs Review Labs Reviewed - No data to display  Imaging Review No results found.   MDM   1. Bronchitis, allergic, mild persistent, uncomplicated        Billy Fischer, MD 03/14/14 1319

## 2014-03-27 ENCOUNTER — Encounter: Payer: Self-pay | Admitting: Internal Medicine

## 2014-03-27 ENCOUNTER — Encounter: Payer: Self-pay | Admitting: *Deleted

## 2014-03-27 ENCOUNTER — Ambulatory Visit (INDEPENDENT_AMBULATORY_CARE_PROVIDER_SITE_OTHER): Payer: Self-pay | Admitting: Internal Medicine

## 2014-03-27 VITALS — BP 120/84 | HR 76 | Ht 72.0 in | Wt 271.4 lb

## 2014-03-27 DIAGNOSIS — E669 Obesity, unspecified: Secondary | ICD-10-CM

## 2014-03-27 DIAGNOSIS — I472 Ventricular tachycardia: Secondary | ICD-10-CM

## 2014-03-27 DIAGNOSIS — I428 Other cardiomyopathies: Secondary | ICD-10-CM

## 2014-03-27 DIAGNOSIS — I429 Cardiomyopathy, unspecified: Secondary | ICD-10-CM

## 2014-03-27 DIAGNOSIS — I4729 Other ventricular tachycardia: Secondary | ICD-10-CM | POA: Insufficient documentation

## 2014-03-27 DIAGNOSIS — I519 Heart disease, unspecified: Secondary | ICD-10-CM

## 2014-03-27 DIAGNOSIS — Z720 Tobacco use: Secondary | ICD-10-CM

## 2014-03-27 LAB — MDC_IDC_ENUM_SESS_TYPE_INCLINIC
Battery Remaining Longevity: 100.8 mo
Date Time Interrogation Session: 20151019095700
HIGH POWER IMPEDANCE MEASURED VALUE: 68.625
Implantable Pulse Generator Serial Number: 7196009
Lead Channel Impedance Value: 362.5 Ohm
Lead Channel Pacing Threshold Amplitude: 0.75 V
Lead Channel Sensing Intrinsic Amplitude: 12 mV
Lead Channel Setting Pacing Amplitude: 2.5 V
Lead Channel Setting Sensing Sensitivity: 0.5 mV
MDC IDC MSMT LEADCHNL RV PACING THRESHOLD PULSEWIDTH: 0.5 ms
MDC IDC SET LEADCHNL RV PACING PULSEWIDTH: 0.5 ms
MDC IDC SET ZONE DETECTION INTERVAL: 250 ms
MDC IDC SET ZONE DETECTION INTERVAL: 300 ms
MDC IDC SET ZONE DETECTION INTERVAL: 345 ms
MDC IDC STAT BRADY RV PERCENT PACED: 0 %

## 2014-03-27 NOTE — Patient Instructions (Signed)
Your physician wants you to follow-up in: 12 months with Dr Antonietta Barcelona will receive a reminder letter in the mail two months in advance. If you don't receive a letter, please call our office to schedule the follow-up appointment.   Remote monitoring is used to monitor your Pacemaker of ICD from home. This monitoring reduces the number of office visits required to check your device to one time per year. It allows [place] to keep an eye on the functioning of your device to ensure it is working properly. You are scheduled for a device check from home on 06/28/14. You may send your transmission at any time that day. If you have a wireless device, the transmission will be sent automatically. After your physician reviews your transmission, you will receive a postcard with your next transmission date.

## 2014-03-27 NOTE — Progress Notes (Signed)
PCP: Noralee Space, MD Primary Cardiologist:  [name] is a 48 y.o. male who presents today for routine electrophysiology followup.  Since his recent ICD implant, the patient reports doing very well.  Today, he denies symptoms of palpitations, exertional chest pain, shortness of breath,  lower extremity edema, dizziness, presyncope, syncope, or ICD shocks.  He has rare chest pressure at rest lasting about 1 minute.  His last episode occurred last night when he turned over in bed and noticed discomfort.   The patient is otherwise without complaint today.   Past Medical History  Diagnosis Date  . Obesity   . 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 History of renal calculi   . Cardiomyopathy     a. 07/2013 Echo: EF 20-25%, diff HK, Gr2 DD, mild MR, mod dil LA/RA.  Marland Kitchen Fracture of left humerus     a. 07/2013.  Marland Kitchen Chronic bronchitis     "get it most q yr" (12/23/2013)  . Kidney stones   . GERD (gastroesophageal reflux disease)     not at present time  . Heart murmur     "born w/it"   . Type II diabetes mellitus   . Obesity   . Chronic systolic dysfunction of left ventricle   . Syncope   . Hypertension    Past Surgical History  Procedure Laterality Date  . Cholecystectomy  ~ 2010  . Cardiac catheterization  08/10/13  . Ureteroscopy      "laser for kidney stones"  . Orif humerus fracture Left 08/12/2013    Procedure: OPEN REDUCTION INTERNAL FIXATION (ORIF) HUMERAL SHAFT FRACTURE;  Surgeon: Newt Minion, MD;  Location: [facility];  Service: Orthopedics;  Laterality: Left;  Open Reduction Internal Fixation Left Humerus  . Implantable cardioverter defibrillator implant  12/23/2013    STJ Fortify ICD implanted by [name] Rayann Heman for cardiomyopathy and syncope    ROS- all systems are reviewed and negative except as per HPI above  Current Outpatient Prescriptions  Medication Sig Dispense Refill  . aspirin 81 MG tablet Take 81 mg by mouth  daily.      . carvedilol (COREG) 12.5 MG tablet Take 1 tablet (12.5 mg total) by mouth 2 (two) times daily.  60 tablet  3  . furosemide (LASIX) 20 MG tablet Take 1 tablet (20 mg total) by mouth daily.  90 tablet  3  . ibuprofen (ADVIL,MOTRIN) 200 MG tablet Take 600 mg by mouth every 6 (six) hours as needed for mild pain.      Marland Kitchen losartan (COZAAR) 25 MG tablet Take 1 tablet (25 mg total) by mouth daily.  90 tablet  3  . metFORMIN (GLUCOPHAGE) 500 MG tablet Take 1 tablet (500 mg total) by mouth 2 (two) times daily with a meal. <future refills should be sent to PCP>  60 tablet  0  . minocycline (MINOCIN,DYNACIN) 100 MG capsule Take 1 capsule (100 mg total) by mouth 2 (two) times daily.  20 capsule  0  . spironolactone (ALDACTONE) 25 MG tablet Take 1 tablet (25 mg total) by mouth daily.  90 tablet  3   No current facility-administered medications for this visit.    Physical Exam: Filed Vitals:   03/27/14 0942  BP: 120/84  Pulse: 76  Height: 6' (1.829 m)  Weight: 271 lb 6.4 oz (123.106 kg)    GEN- The patient is well appearing, alert and oriented x 3 today.   Head-  normocephalic, atraumatic Eyes-  Sclera clear, conjunctiva pink Ears- hearing intact Oropharynx- clear Lungs- Clear to ausculation bilaterally, normal work of breathing Chest- ICD pocket is well healed Heart- Regular rate and rhythm, no murmurs, rubs or gallops, PMI not laterally displaced GI- soft, NT, ND, + BS Extremities- no clubbing, cyanosis, or edema  ICD interrogation- reviewed in detail today,  See PACEART report  Assessment and Plan:  1.  Chronic systolic dysfunction euvolemic today Stable on an appropriate medical regimen.  I discussed the possibility of increasing coreg with him today if he has further arrhthymias. Normal ICD function See Pace Art report No changes today Will enroll in ICM device clinic with [name] to follow [facility]  2. Atypical chest pain Likely musculoskeletal.  We discussed  this at length today.  No changes or further workup today He already has follow-up with [name] Stanford Breed tomorrow.  I offered to push that appointment out a few months but he would like to keep it.  3. NSVT/ h/o Syncope Observed on ICD interrogation today Consider increasing coreg in the future if BP allows or he has sustained VT No changes today  4. Obesity Body mass index is 36.8 kg/(m^2). I have encouraged weight loss as his weight impacts our ability to care for his CHF.  5. Tobacco abuse I have encouraged cessation He is not ready to quit  [facility] remote monitoring Return to see me in 1 year

## 2014-03-27 NOTE — Progress Notes (Signed)
HPI: FU CM. Echo 2/15 revealed EF of 20-25% with diffuse hypokinesis and biatrial enlargement, mild MR and a small pericardial effusion. TSH in February 2015 normal. Cardiac catheterization in March of 2015 showed normal coronary arteries. Echo repeated 5/15 and showed EF 30-35, biatrial enlargement, mildly dilated aortic root. ICD inserted 7/15; since last seen, He has some fatigue. There is mild dyspnea on exertion. No orthopnea, PND, pedal edema or syncope. His defibrillator has not fired. He has an occasional brief pain in his chest that is not sustained.   Current Outpatient Prescriptions  Medication Sig Dispense Refill  . aspirin 81 MG tablet Take 81 mg by mouth daily.      . carvedilol (COREG) 12.5 MG tablet Take 1 tablet (12.5 mg total) by mouth 2 (two) times daily.  60 tablet  3  . furosemide (LASIX) 20 MG tablet Take 1 tablet (20 mg total) by mouth daily.  90 tablet  3  . ibuprofen (ADVIL,MOTRIN) 200 MG tablet Take 600 mg by mouth every 6 (six) hours as needed for mild pain.      Marland Kitchen losartan (COZAAR) 25 MG tablet Take 1 tablet (25 mg total) by mouth daily.  90 tablet  3  . metFORMIN (GLUCOPHAGE) 500 MG tablet Take 1 tablet (500 mg total) by mouth 2 (two) times daily with a meal. <future refills should be sent to PCP>  60 tablet  0  . minocycline (MINOCIN,DYNACIN) 100 MG capsule Take 1 capsule (100 mg total) by mouth 2 (two) times daily.  20 capsule  0  . spironolactone (ALDACTONE) 25 MG tablet Take 1 tablet (25 mg total) by mouth daily.  90 tablet  3   No current facility-administered medications for this visit.     Past Medical History  Diagnosis Date  . Obesity   . 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 History of renal calculi   . Cardiomyopathy     a. 07/2013 Echo: EF 20-25%, diff HK, Gr2 DD, mild MR, mod dil LA/RA.  Marland Kitchen Fracture of left humerus     a. 07/2013.  Marland Kitchen Chronic bronchitis     "get it most q yr" (12/23/2013)    . Kidney stones   . GERD (gastroesophageal reflux disease)     not at present time  . Heart murmur     "born w/it"   . Type II diabetes mellitus   . Obesity   . Chronic systolic dysfunction of left ventricle   . Syncope   . Hypertension     Past Surgical History  Procedure Laterality Date  . Cholecystectomy  ~ 2010  . Cardiac catheterization  08/10/13  . Ureteroscopy      "laser for kidney stones"  . Orif humerus fracture Left 08/12/2013    Procedure: OPEN REDUCTION INTERNAL FIXATION (ORIF) HUMERAL SHAFT FRACTURE;  Surgeon: Newt Minion, MD;  Location: [place];  Service: Orthopedics;  Laterality: Left;  Open Reduction Internal Fixation Left Humerus  . Implantable cardioverter defibrillator implant  12/23/2013    STJ Fortify ICD implanted by [name] for cardiomyopathy and syncope    History   Social History  . Marital Status: Single    Spouse Name: N/A    Number of Children: N/A  . Years of Education: N/A   Occupational History  . Not on file.   Social History Main Topics  . Smoking status: Current Every Day Smoker -- 1.00 packs/day for 28  years    Types: Cigarettes  . Smokeless tobacco: Never Used  . Alcohol Use: Yes     Comment: 12/23/2013 "might drink a couple times/yr"  . Drug Use: Yes    Special: Marijuana  . Sexual Activity: Yes   Other Topics Concern  . Not on file   Social History Narrative  . No narrative on file    ROS: Fatigue but no fevers or chills, productive cough, hemoptysis, dysphasia, odynophagia, melena, hematochezia, dysuria, hematuria, rash, seizure activity, orthopnea, PND, pedal edema, claudication. Remaining systems are negative.  Physical Exam: Well-developed well-nourished in no acute distress.  Skin is warm and dry.  HEENT is normal.  Neck is supple.  Chest is clear to auscultation with normal expansion. ICD left chest Cardiovascular exam is regular rate and rhythm. Positive gallop Abdominal exam nontender or distended. No masses  palpated. Extremities show no edema. neuro grossly intact

## 2014-03-28 ENCOUNTER — Encounter: Payer: Self-pay | Admitting: Cardiology

## 2014-03-28 ENCOUNTER — Ambulatory Visit (INDEPENDENT_AMBULATORY_CARE_PROVIDER_SITE_OTHER): Payer: Self-pay | Admitting: Cardiology

## 2014-03-28 VITALS — BP 127/87 | HR 93 | Ht 73.0 in | Wt 272.3 lb

## 2014-03-28 DIAGNOSIS — I5022 Chronic systolic (congestive) heart failure: Secondary | ICD-10-CM | POA: Insufficient documentation

## 2014-03-28 DIAGNOSIS — Z95810 Presence of automatic (implantable) cardiac defibrillator: Secondary | ICD-10-CM | POA: Insufficient documentation

## 2014-03-28 DIAGNOSIS — I429 Cardiomyopathy, unspecified: Secondary | ICD-10-CM

## 2014-03-28 MED ORDER — LOSARTAN POTASSIUM 50 MG PO TABS: 50.0000 mg | ORAL_TABLET | Freq: Every day | ORAL | Status: DC

## 2014-03-28 NOTE — Assessment & Plan Note (Signed)
Previous catheterization did not reveal coronary artery disease.

## 2014-03-28 NOTE — Assessment & Plan Note (Signed)
Followed by electrophysiology. 

## 2014-03-28 NOTE — Patient Instructions (Signed)
Your physician recommends that you schedule a follow-up appointment in: 3 [place] TO 50 MG ONCE DAILY= 2 OF THE 25 MG TABLETS ONCE DAILY  Your physician recommends that you return for lab work in: [place]

## 2014-03-28 NOTE — Assessment & Plan Note (Signed)
Continue present dose of diuretics. Check potassium, renal function and BNP.

## 2014-03-28 NOTE — Assessment & Plan Note (Signed)
Patient with nonischemic cardiomyopathy. Continue present medications but increase Cozaar to 50 mg daily. Check potassium, renal function and BNP in one week. ICD is now in place.

## 2014-03-28 NOTE — Assessment & Plan Note (Signed)
Patient counseled on discontinuing. 

## 2014-04-26 ENCOUNTER — Telehealth: Payer: Self-pay | Admitting: Emergency Medicine

## 2014-04-26 NOTE — Telephone Encounter (Signed)
04/26/2014 11:09 AM Phone (Incoming) [name], [name] T (Self) [phone] (M)  pt calling to see if he can get prescript for medformin to be called to [facility] [facility] please advise   By [place] created in pt's twin brother's chart by mistake.  Called and spoke to pt. Pt requesting refill of metformin. Pt has not seen SN since 2010 and TP in 2012. Advised pt we are unable to refill medication and to call his cardiologist, who last filled med, and inform them he has a new establishing appt with PCP on 11/23 and to hopefully fill med to last till New Patient appt. Pt verbalized understanding and denied any further questions or concerns at this time.

## 2014-04-27 ENCOUNTER — Telehealth: Payer: Self-pay | Admitting: Cardiology

## 2014-04-27 ENCOUNTER — Encounter: Payer: Self-pay | Admitting: *Deleted

## 2014-04-27 NOTE — Telephone Encounter (Signed)
LMOVM reminding pt to send remote transmission.   

## 2014-05-01 ENCOUNTER — Ambulatory Visit: Payer: Self-pay | Admitting: Family

## 2014-05-18 ENCOUNTER — Encounter (HOSPITAL_COMMUNITY): Payer: Self-pay | Admitting: Interventional Cardiology

## 2014-06-12 ENCOUNTER — Emergency Department (INDEPENDENT_AMBULATORY_CARE_PROVIDER_SITE_OTHER)
Admission: EM | Admit: 2014-06-12 | Discharge: 2014-06-12 | Disposition: A | Discharge status: Home / Self Care | Payer: Self-pay | Source: Home / Self Care | Attending: Emergency Medicine | Admitting: Emergency Medicine

## 2014-06-12 ENCOUNTER — Encounter (HOSPITAL_COMMUNITY): Payer: Self-pay | Admitting: *Deleted

## 2014-06-12 DIAGNOSIS — E119 Type 2 diabetes mellitus without complications: Secondary | ICD-10-CM

## 2014-06-12 DIAGNOSIS — J441 Chronic obstructive pulmonary disease with (acute) exacerbation: Secondary | ICD-10-CM

## 2014-06-12 MED ORDER — METFORMIN HCL 500 MG PO TABS: 500.0000 mg | ORAL_TABLET | Freq: Two times a day (BID) | ORAL | Status: DC

## 2014-06-12 MED ORDER — TRIAMCINOLONE ACETONIDE 40 MG/ML IJ SUSP
INTRAMUSCULAR | Status: AC
  Filled 2014-06-12: qty 1

## 2014-06-12 MED ORDER — TRIAMCINOLONE ACETONIDE 40 MG/ML IJ SUSP
40.0000 mg | Freq: Once | INTRAMUSCULAR | Status: AC
  Administered 2014-06-12: 40 mg via INTRAMUSCULAR

## 2014-06-12 MED ORDER — DOXYCYCLINE HYCLATE 100 MG PO CAPS: 100.0000 mg | ORAL_CAPSULE | Freq: Two times a day (BID) | ORAL | Status: DC

## 2014-06-12 NOTE — Discharge Instructions (Signed)
Take doxycyline 1 pill twice a day for 10 days. I have refilled your metformin for 3 months to give you time to find a new doctor.  Follow up as needed.

## 2014-06-12 NOTE — ED Provider Notes (Signed)
CSN: [id number]     Arrival date & time 06/12/14  1507 History   None    Chief Complaint  Patient presents with  . URI   (Consider location/radiation/quality/duration/timing/severity/associated sxs/prior Treatment) HPI  He is a 49 year old man here for evaluation of cough. He states he has a history of chronic bronchitis and his cough has been worse and productive of more sputum than normal. He also reports sinus drainage. No fevers or chills. No nasal congestion or rhinorrhea. His symptoms started several days ago.  He also needs a refill of his metformin. He states his doctor retired and he has been out of this medication for some time.  Past Medical History  Diagnosis Date  . Obesity   . 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 History of renal calculi   . Cardiomyopathy     a. 07/2013 Echo: EF 20-25%, diff HK, Gr2 DD, mild MR, mod dil LA/RA.  Marland Kitchen Fracture of left humerus     a. 07/2013.  Marland Kitchen Chronic bronchitis     "get it most q yr" (12/23/2013)  . Kidney stones   . GERD (gastroesophageal reflux disease)     not at present time  . Heart murmur     "born w/it"   . Type II diabetes mellitus   . Obesity   . Chronic systolic dysfunction of left ventricle   . Syncope   . Hypertension    Past Surgical History  Procedure Laterality Date  . Cholecystectomy  ~ 2010  . Cardiac catheterization  08/10/13  . Ureteroscopy      "laser for kidney stones"  . Orif humerus fracture Left 08/12/2013    Procedure: OPEN REDUCTION INTERNAL FIXATION (ORIF) HUMERAL SHAFT FRACTURE;  Surgeon: Newt Minion, MD;  Location: [facility];  Service: Orthopedics;  Laterality: Left;  Open Reduction Internal Fixation Left Humerus  . Implantable cardioverter defibrillator implant  12/23/2013    STJ Fortify ICD implanted by [name] for cardiomyopathy and syncope  . Left heart catheterization with coronary angiogram N/A 08/10/2013    Procedure: LEFT HEART CATHETERIZATION WITH  CORONARY ANGIOGRAM;  Surgeon: Jettie Booze, MD;  Location: [facility] CATH LAB;  Service: Cardiovascular;  Laterality: N/A;  . Implantable cardioverter defibrillator implant N/A 12/23/2013    Procedure: IMPLANTABLE CARDIOVERTER DEFIBRILLATOR IMPLANT;  Surgeon: Coralyn Mark, MD;  Location: [facility] CATH LAB;  Service: Cardiovascular;  Laterality: N/A;   Family History  Problem Relation Age of Onset  . Diabetes Father   . Arthritis Father   . Diabetes Mother   . Arthritis Mother   . Hypertension Brother    History  Substance Use Topics  . Smoking status: Current Every Day Smoker -- 1.00 packs/day for 28 years    Types: Cigarettes  . Smokeless tobacco: Never Used  . Alcohol Use: Yes     Comment: 12/23/2013 "might drink a couple times/yr"    Review of Systems As in history of present illness Allergies  Bee venom  Home Medications   Prior to Admission medications   Medication Sig Start Date End Date Taking? Authorizing Provider  aspirin 81 MG tablet Take 81 mg by mouth daily.    Historical Provider, MD  carvedilol (COREG) 12.5 MG tablet Take 1 tablet (12.5 mg total) by mouth 2 (two) times daily. 09/19/13   Burtis Junes, NP  doxycycline (VIBRAMYCIN) 100 MG capsule Take 1 capsule (100 mg total) by mouth 2 (two) times daily. 06/12/14  Melony Overly, MD  furosemide (LASIX) 20 MG tablet Take 1 tablet (20 mg total) by mouth daily. 10/20/13   Lelon Perla, MD  ibuprofen (ADVIL,MOTRIN) 200 MG tablet Take 600 mg by mouth every 6 (six) hours as needed for mild pain.    Historical Provider, MD  losartan (COZAAR) 50 MG tablet Take 1 tablet (50 mg total) by mouth daily. 03/28/14   Lelon Perla, MD  metFORMIN (GLUCOPHAGE) 500 MG tablet Take 1 tablet (500 mg total) by mouth 2 (two) times daily with a meal. <future refills should be sent to PCP> 06/12/14   Melony Overly, MD  minocycline (MINOCIN,DYNACIN) 100 MG capsule Take 1 capsule (100 mg total) by mouth 2 (two) times daily. 03/14/14   Billy Fischer,  MD  spironolactone (ALDACTONE) 25 MG tablet Take 1 tablet (25 mg total) by mouth daily. 12/22/13   Lelon Perla, MD   BP 113/80 mmHg  Pulse 100  Temp(Src) 98 F (36.7 C) (Oral)  Resp 16  SpO2 95% Physical Exam  Constitutional: He is oriented to person, place, and time. He appears well-developed and well-nourished. No distress.  HENT:  Head: Normocephalic and atraumatic.  Mouth/Throat: Oropharynx is clear and moist.  Neck: Neck supple.  Cardiovascular: Normal rate, regular rhythm and normal heart sounds.   No murmur heard. Pulmonary/Chest: Breath sounds normal. No respiratory distress. He has no wheezes. He has no rales.  Neurological: He is alert and oriented to person, place, and time.    ED Course  Procedures (including critical care time) Labs Review Labs Reviewed - No data to display  Imaging Review No results found.   MDM   1. COPD exacerbation   2. Type 2 diabetes mellitus without complication    Kenalog 40 mg IM given for COPD exacerbation. Doxycycline 100 mg twice a day for 10 days. Provided 3 months supply of metformin. Follow-up as needed.    Melony Overly, MD 06/12/14 1540

## 2014-06-12 NOTE — ED Notes (Signed)
Pt  Has  Symptoms  Of  Congestion     - cough   With  Sinus  draiange  That  Has  Settled  In his  Chest   For   A  Non  Productive  Cough         Symptoms  For  sev  Days      He  Is  Also  Requesting an RX  For  Metformin

## 2014-06-28 ENCOUNTER — Encounter: Payer: Self-pay | Admitting: *Deleted

## 2014-06-28 ENCOUNTER — Telehealth: Payer: Self-pay | Admitting: Cardiology

## 2014-06-28 NOTE — Progress Notes (Signed)
HPI: FU CM. Echo 2/15 revealed EF of 20-25% with diffuse hypokinesis and biatrial enlargement, mild MR and a small pericardial effusion. TSH in February 2015 normal. Cardiac catheterization in March of 2015 showed normal coronary arteries. Echo repeated 5/15 and showed EF 30-35, biatrial enlargement, mildly dilated aortic root. ICD inserted 7/15. Since last seen,   Current Outpatient Prescriptions  Medication Sig Dispense Refill  . aspirin 81 MG tablet Take 81 mg by mouth daily.    . carvedilol (COREG) 12.5 MG tablet Take 1 tablet (12.5 mg total) by mouth 2 (two) times daily. 60 tablet 3  . doxycycline (VIBRAMYCIN) 100 MG capsule Take 1 capsule (100 mg total) by mouth 2 (two) times daily. 20 capsule 0  . furosemide (LASIX) 20 MG tablet Take 1 tablet (20 mg total) by mouth daily. 90 tablet 3  . ibuprofen (ADVIL,MOTRIN) 200 MG tablet Take 600 mg by mouth every 6 (six) hours as needed for mild pain.    Marland Kitchen losartan (COZAAR) 50 MG tablet Take 1 tablet (50 mg total) by mouth daily. 90 tablet 3  . metFORMIN (GLUCOPHAGE) 500 MG tablet Take 1 tablet (500 mg total) by mouth 2 (two) times daily with a meal. <future refills should be sent to PCP> 60 tablet 2  . minocycline (MINOCIN,DYNACIN) 100 MG capsule Take 1 capsule (100 mg total) by mouth 2 (two) times daily. 20 capsule 0  . spironolactone (ALDACTONE) 25 MG tablet Take 1 tablet (25 mg total) by mouth daily. 90 tablet 3   No current facility-administered medications for this visit.     Past Medical History  Diagnosis Date  . Obesity   . 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 History of renal calculi   . Cardiomyopathy     a. 07/2013 Echo: EF 20-25%, diff HK, Gr2 DD, mild MR, mod dil LA/RA.  Marland Kitchen Fracture of left humerus     a. 07/2013.  Marland Kitchen Chronic bronchitis     "get it most q yr" (12/23/2013)  . Kidney stones   . GERD (gastroesophageal reflux disease)     not at present time  . Heart  murmur     "born w/it"   . Type II diabetes mellitus   . Obesity   . Chronic systolic dysfunction of left ventricle   . Syncope   . Hypertension     Past Surgical History  Procedure Laterality Date  . Cholecystectomy  ~ 2010  . Cardiac catheterization  08/10/13  . Ureteroscopy      "laser for kidney stones"  . Orif humerus fracture Left 08/12/2013    Procedure: OPEN REDUCTION INTERNAL FIXATION (ORIF) HUMERAL SHAFT FRACTURE;  Surgeon: Newt Minion, MD;  Location: [place];  Service: Orthopedics;  Laterality: Left;  Open Reduction Internal Fixation Left Humerus  . Implantable cardioverter defibrillator implant  12/23/2013    STJ Fortify ICD implanted by [name] for cardiomyopathy and syncope  . Left heart catheterization with coronary angiogram N/A 08/10/2013    Procedure: LEFT HEART CATHETERIZATION WITH CORONARY ANGIOGRAM;  Surgeon: Jettie Booze, MD;  Location: [facility] CATH LAB;  Service: Cardiovascular;  Laterality: N/A;  . Implantable cardioverter defibrillator implant N/A 12/23/2013    Procedure: IMPLANTABLE CARDIOVERTER DEFIBRILLATOR IMPLANT;  Surgeon: Coralyn Mark, MD;  Location: [facility] CATH LAB;  Service: Cardiovascular;  Laterality: N/A;    History   Social History  . Marital Status: Single    Spouse Name: N/A  Number of Children: N/A  . Years of Education: N/A   Occupational History  . Not on file.   Social History Main Topics  . Smoking status: Current Every Day Smoker -- 1.00 packs/day for 28 years    Types: Cigarettes  . Smokeless tobacco: Never Used  . Alcohol Use: Yes     Comment: 12/23/2013 "might drink a couple times/yr"  . Drug Use: Yes    Special: Marijuana  . Sexual Activity: Yes   Other Topics Concern  . Not on file   Social History Narrative    ROS: no fevers or chills, productive cough, hemoptysis, dysphasia, odynophagia, melena, hematochezia, dysuria, hematuria, rash, seizure activity, orthopnea, PND, pedal edema, claudication. Remaining systems are  negative.  Physical Exam: Well-developed well-nourished in no acute distress.  Skin is warm and dry.  HEENT is normal.  Neck is supple.  Chest is clear to auscultation with normal expansion.  Cardiovascular exam is regular rate and rhythm.  Abdominal exam nontender or distended. No masses palpated. Extremities show no edema. neuro grossly intact  ECG     This encounter was created in error - please disregard.

## 2014-06-28 NOTE — Telephone Encounter (Signed)
LMOVM reminding pt to send remote transmission.   

## 2014-06-29 ENCOUNTER — Encounter: Payer: Self-pay | Admitting: Cardiology

## 2014-07-03 ENCOUNTER — Encounter: Payer: Self-pay | Admitting: Cardiology

## 2014-07-04 ENCOUNTER — Encounter: Payer: Self-pay | Admitting: *Deleted

## 2014-08-03 ENCOUNTER — Telehealth: Payer: Self-pay | Admitting: Internal Medicine

## 2014-08-03 ENCOUNTER — Ambulatory Visit (INDEPENDENT_AMBULATORY_CARE_PROVIDER_SITE_OTHER): Payer: Medicaid Other | Admitting: *Deleted

## 2014-08-03 ENCOUNTER — Telehealth: Payer: Self-pay | Admitting: Cardiology

## 2014-08-03 DIAGNOSIS — I428 Other cardiomyopathies: Secondary | ICD-10-CM

## 2014-08-03 DIAGNOSIS — I5022 Chronic systolic (congestive) heart failure: Secondary | ICD-10-CM | POA: Diagnosis not present

## 2014-08-03 DIAGNOSIS — Z95810 Presence of automatic (implantable) cardiac defibrillator: Secondary | ICD-10-CM | POA: Diagnosis not present

## 2014-08-03 LAB — MDC_IDC_ENUM_SESS_TYPE_REMOTE
Battery Remaining Longevity: 98 mo
Battery Remaining Percentage: 93 %
HIGH POWER IMPEDANCE MEASURED VALUE: 68 Ohm
HighPow Impedance: 68 Ohm
Implantable Pulse Generator Serial Number: 7196009
Lead Channel Sensing Intrinsic Amplitude: 12 mV
MDC IDC MSMT BATTERY VOLTAGE: 3.19 V
MDC IDC MSMT LEADCHNL RV IMPEDANCE VALUE: 400 Ohm
MDC IDC MSMT LEADCHNL RV PACING THRESHOLD AMPLITUDE: 0.75 V
MDC IDC MSMT LEADCHNL RV PACING THRESHOLD PULSEWIDTH: 0.5 ms
MDC IDC SESS DTM: 20160225202642
MDC IDC SET LEADCHNL RV PACING AMPLITUDE: 2.5 V
MDC IDC SET LEADCHNL RV PACING PULSEWIDTH: 0.5 ms
MDC IDC SET LEADCHNL RV SENSING SENSITIVITY: 0.5 mV
MDC IDC STAT BRADY RV PERCENT PACED: 1 %
Zone Setting Detection Interval: 250 ms
Zone Setting Detection Interval: 300 ms
Zone Setting Detection Interval: 345 ms

## 2014-08-03 NOTE — Telephone Encounter (Signed)
Spoke with pt and reminded pt of remote transmission that is due today. Pt verbalized understanding.   

## 2014-08-03 NOTE — Telephone Encounter (Signed)
°  1. Has your device fired? No  2. Is you device beeping? No  3. Are you experiencing draining or swelling at device site? No  4. Are you calling to see if we received your device transmission? Calling to find out how to do remote transmission  5. Have you passed out? No

## 2014-08-03 NOTE — Telephone Encounter (Signed)
Spoke w/ pt and informed him to call tech services b/c monitor is not getting a connection.

## 2014-08-04 ENCOUNTER — Encounter: Payer: Self-pay | Admitting: *Deleted

## 2014-08-04 NOTE — Progress Notes (Signed)
EPIC Encounter for ICM Monitoring  Patient Name: [name] is a 49 y.o. male Date: 08/04/2014 Primary Care Physican: No PCP Per Patient Primary Cardiologist: Stanford Breed Electrophysiologist: Allred Dry Weight: 260 lbs       In the past month, have you:  1. Gained more than 2 pounds in a day or more than 5 pounds in a week? no  2. Had changes in your medications (with verification of current medications)? no  3. Had more shortness of breath than is usual for you? Yes. Breathing is a little more labored with activity and at rest.  4. Limited your activity because of shortness of breath? no  5. Not been able to sleep because of shortness of breath? no  6. Had increased swelling in your feet or ankles? No. He has had somewhat of a bloated feeling in his abdomen.  7. Had symptoms of dehydration (dizziness, dry mouth, increased thirst, decreased urine output) no  8. Had changes in sodium restriction? No. He does eat canned vegetables. I have advised him to eat frozen vs canned. He is agreeable with doing this.   9. Been compliant with medication? Yes   ICM trend:   Follow-up plan: ICM clinic phone appointment: 09/04/14. The patient's corvue readings were elevated from ~ 2/13-2/24. The patient's weight has been stable, but his breathing is more labored and he does feel a little bloated. I have advised him to increase his lasix to 40 mg daily x 3 days, then resume 20 mg once daily. He verbalizes understanding.    Copy of note sent to patient's primary care physician, primary cardiologist, and device following physician.  Alvis Lemmings, RN, BSN 08/04/2014 12:54 PM

## 2014-08-04 NOTE — Progress Notes (Signed)
Remote ICD transmission.   

## 2014-08-09 ENCOUNTER — Other Ambulatory Visit: Payer: Self-pay | Admitting: *Deleted

## 2014-08-09 MED ORDER — CARVEDILOL 12.5 MG PO TABS: 12.5000 mg | ORAL_TABLET | Freq: Two times a day (BID) | ORAL | Status: DC

## 2014-08-21 ENCOUNTER — Encounter: Payer: Self-pay | Admitting: Cardiology

## 2014-08-25 ENCOUNTER — Encounter: Payer: Self-pay | Admitting: Internal Medicine

## 2014-08-29 ENCOUNTER — Telehealth: Payer: Self-pay | Admitting: Internal Medicine

## 2014-08-29 NOTE — Telephone Encounter (Signed)
New message       Pt c/o Shortness Of Breath: STAT if SOB developed within the last 24 hours or pt is noticeably SOB on the phone  1. Are you currently SOB (can you hear that pt is SOB on the phone)? no 2. How long have you been experiencing SOB? Pt is "always" sob  3. Are you SOB when sitting or when up moving around? no 4. Are you currently experiencing any other symptoms? Gained 6lbs in 3 days--no swelling in feet/ankles or legs

## 2014-08-29 NOTE — Telephone Encounter (Signed)
Left message of patient's mobile phone to call back and could not leave message on home phone.

## 2014-08-30 NOTE — Telephone Encounter (Signed)
F/U ° ° ° ° ° ° ° ° °Pt is returning call. Please call back. °

## 2014-08-30 NOTE — Telephone Encounter (Signed)
Patient has appointment 09/01/14 with Cecilie Kicks PA.

## 2014-09-01 ENCOUNTER — Ambulatory Visit (INDEPENDENT_AMBULATORY_CARE_PROVIDER_SITE_OTHER): Payer: Medicaid Other | Admitting: Cardiology

## 2014-09-01 ENCOUNTER — Encounter: Payer: Self-pay | Admitting: Cardiology

## 2014-09-01 VITALS — BP 128/78 | HR 73 | Ht 73.0 in | Wt 273.0 lb

## 2014-09-01 DIAGNOSIS — R0602 Shortness of breath: Secondary | ICD-10-CM | POA: Diagnosis not present

## 2014-09-01 DIAGNOSIS — R079 Chest pain, unspecified: Secondary | ICD-10-CM

## 2014-09-01 DIAGNOSIS — I509 Heart failure, unspecified: Secondary | ICD-10-CM

## 2014-09-01 LAB — CBC
HEMATOCRIT: 41 % (ref 39.0–52.0)
HEMOGLOBIN: 14.1 g/dL (ref 13.0–17.0)
MCH: 30 pg (ref 26.0–34.0)
MCHC: 34.4 g/dL (ref 30.0–36.0)
MCV: 87.2 fL (ref 78.0–100.0)
MPV: 9 fL (ref 8.6–12.4)
Platelets: 255 10*3/uL (ref 150–400)
RBC: 4.7 MIL/uL (ref 4.22–5.81)
RDW: 13.7 % (ref 11.5–15.5)
WBC: 6.8 10*3/uL (ref 4.0–10.5)

## 2014-09-01 LAB — BASIC METABOLIC PANEL
BUN: 11 mg/dL (ref 6–23)
CALCIUM: 9.6 mg/dL (ref 8.4–10.5)
CO2: 26 meq/L (ref 19–32)
Chloride: 102 mEq/L (ref 96–112)
Creat: 0.89 mg/dL (ref 0.50–1.35)
GLUCOSE: 182 mg/dL — AB (ref 70–99)
Potassium: 3.6 mEq/L (ref 3.5–5.3)
Sodium: 139 mEq/L (ref 135–145)

## 2014-09-01 LAB — MAGNESIUM: Magnesium: 1.6 mg/dL (ref 1.5–2.5)

## 2014-09-01 MED ORDER — FUROSEMIDE 40 MG PO TABS: 40.0000 mg | ORAL_TABLET | Freq: Every day | ORAL | Status: DC

## 2014-09-01 NOTE — Patient Instructions (Addendum)
Take 40 mg twice a day for three days then resume back to 40mg  once a day     Labs today  bmet mag cbc tsh and bnp     CUT BACK RUNNING TO 1 MILE PER DAY    Low-Sodium Eating Plan Sodium raises blood pressure and causes water to be held in the body. Getting less sodium from food will help lower your blood pressure, reduce any swelling, and protect your heart, liver, and kidneys. We get sodium by adding salt (sodium chloride) to food. Most of our sodium comes from canned, boxed, and frozen foods. Restaurant foods, fast foods, and pizza are also very high in sodium. Even if you take medicine to lower your blood pressure or to reduce fluid in your body, getting less sodium from your food is important. WHAT IS MY PLAN? Most people should limit their sodium intake to 2,300 mg a day. Your health care provider recommends that you limit your sodium intake to __________ a day.  WHAT DO I NEED TO KNOW ABOUT THIS EATING PLAN? For the low-sodium eating plan, you will follow these general guidelines:  Choose foods with a % Daily Value for sodium of less than 5% (as listed on the food label).   Use salt-free seasonings or herbs instead of table salt or sea salt.   Check with your health care provider or pharmacist before using salt substitutes.   Eat fresh foods.  Eat more vegetables and fruits.  Limit canned vegetables. If you do use them, rinse them well to decrease the sodium.   Limit cheese to 1 oz (28 g) per day.   Eat lower-sodium products, often labeled as "lower sodium" or "no salt added."  Avoid foods that contain monosodium glutamate (MSG). MSG is sometimes added to [place] food and some canned foods.  Check food labels (Nutrition Facts labels) on foods to learn how much sodium is in one serving.  Eat more home-cooked food and less restaurant, buffet, and fast food.  When eating at a restaurant, ask that your food be prepared with less salt or none, if possible.  HOW DO  I READ FOOD LABELS FOR SODIUM INFORMATION? The Nutrition Facts label lists the amount of sodium in one serving of the food. If you eat more than one serving, you must multiply the listed amount of sodium by the number of servings. Food labels may also identify foods as:  Sodium free--Less than 5 mg in a serving.  Very low sodium--35 mg or less in a serving.  Low sodium--140 mg or less in a serving.  Light in sodium--50% less sodium in a serving. For example, if a food that usually has 300 mg of sodium is changed to become light in sodium, it will have 150 mg of sodium.  Reduced sodium--25% less sodium in a serving. For example, if a food that usually has 400 mg of sodium is changed to reduced sodium, it will have 300 mg of sodium. WHAT FOODS CAN I EAT? Grains Low-sodium cereals, including oats, puffed wheat and rice, and shredded wheat cereals. Low-sodium crackers. Unsalted rice and pasta. Lower-sodium bread.  Vegetables Frozen or fresh vegetables. Low-sodium or reduced-sodium canned vegetables. Low-sodium or reduced-sodium tomato sauce and paste. Low-sodium or reduced-sodium tomato and vegetable juices.  Fruits Fresh, frozen, and canned fruit. Fruit juice.  Meat and Other Protein Products Low-sodium canned tuna and salmon. Fresh or frozen meat, poultry, seafood, and fish. Lamb. Unsalted nuts. Dried beans, peas, and lentils without added salt. Unsalted  canned beans. Homemade soups without salt. Eggs.  Dairy Milk. Soy milk. Ricotta cheese. Low-sodium or reduced-sodium cheeses. Yogurt.  Condiments Fresh and dried herbs and spices. Salt-free seasonings. Onion and garlic powders. Low-sodium varieties of mustard and ketchup. Lemon juice.  Fats and Oils Reduced-sodium salad dressings. Unsalted butter.  Other Unsalted popcorn and pretzels.  The items listed above may not be a complete list of recommended foods or beverages. Contact your dietitian for more options. WHAT FOODS ARE NOT  RECOMMENDED? Grains Instant hot cereals. Bread stuffing, pancake, and biscuit mixes. Croutons. Seasoned rice or pasta mixes. Noodle soup cups. Boxed or frozen macaroni and cheese. Self-rising flour. Regular salted crackers. Vegetables Regular canned vegetables. Regular canned tomato sauce and paste. Regular tomato and vegetable juices. Frozen vegetables in sauces. Salted french fries. Olives. Angie Fava. Relishes. Sauerkraut. Salsa. Meat and Other Protein Products Salted, canned, smoked, spiced, or pickled meats, seafood, or fish. Bacon, ham, sausage, hot dogs, corned beef, chipped beef, and packaged luncheon meats. Salt pork. Jerky. Pickled herring. Anchovies, regular canned tuna, and sardines. Salted nuts. Dairy Processed cheese and cheese spreads. Cheese curds. Blue cheese and cottage cheese. Buttermilk.  Condiments Onion and garlic salt, seasoned salt, table salt, and sea salt. Canned and packaged gravies. Worcestershire sauce. Tartar sauce. Barbecue sauce. Teriyaki sauce. Soy sauce, including reduced sodium. Steak sauce. Fish sauce. Oyster sauce. Cocktail sauce. Horseradish. Regular ketchup and mustard. Meat flavorings and tenderizers. Bouillon cubes. Hot sauce. Tabasco sauce. Marinades. Taco seasonings. Relishes. Fats and Oils Regular salad dressings. Salted butter. Margarine. Ghee. Bacon fat.  Other Potato and tortilla chips. Corn chips and puffs. Salted popcorn and pretzels. Canned or dried soups. Pizza. Frozen entrees and pot pies.  The items listed above may not be a complete list of foods and beverages to avoid. Contact your dietitian for more information. Document Released: 11/15/2001 Document Revised: 05/31/2013 Document Reviewed: 03/30/2013 [facility] Patient Information 2015 [place], [place]. This information is not intended to replace advice given to you by your health care provider. Make sure you discuss any questions you have with your health care provider.

## 2014-09-01 NOTE — Progress Notes (Signed)
Cardiology Office Note   Date:  09/01/2014   ID:  [name], DOB 1966-04-18, MRN [id number]  PCP:  No PCP Per Patient  Cardiologist:  [name]     Chief Complaint  Patient presents with  . Shortness of Breath    with increased exercise he is more SOB and wt gain      History of Present Illness: [name] is a 49 y.o. male who presents for complaint of weight gain and not feeling well.  He is able to run 3 miles per day- but he has done this over 3 weeks, from no miles to 3 miles.  His wt is up on his scales by 17 pounds. Here his wt is similar to OCT of last year though he states he was lighter then- maybe heavier clothing.  He has increased his lasix to 40 mg daily without any change and still with SOB.    His glucose was 300 and he still eats sweets.  Hx on NICM with Echo in 2/15 revealed EF of 20-25% with diffuse hypokinesis and biatrial enlargement, mild MR and a small pericardial effusion. TSH in February 2015 normal. Cardiac catheterization in March of 2015 showed normal coronary arteries. Echo repeated 5/15 and showed EF 30-35, biatrial enlargement, mildly dilated aortic root. ICD inserted 7/15.  He started running (2 years ago he ran 5 miles a day) and has felt worse with the running.    His diet is high in salt and sugar.  We reviewed his diet on one day which included sausage, gravy, sweets.  2 salad dressings on salad, we discussed decreasing salt, salty foods and decreasing his 2 gallons of water daily to 1 gallon.   On last ICD interrogation his fluids were elevated and his lasix was increased.  For 3 days, because of this he increased his lasix his self for last 3 days without much change.   He is planning on going to the [place]th.   Past Medical History  Diagnosis Date  . Obesity   . 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 History of renal calculi   . Cardiomyopathy     a.  07/2013 Echo: EF 20-25%, diff HK, Gr2 DD, mild MR, mod dil LA/RA.  Marland Kitchen Fracture of left humerus     a. 07/2013.  Marland Kitchen Chronic bronchitis     "get it most q yr" (12/23/2013)  . Kidney stones   . GERD (gastroesophageal reflux disease)     not at present time  . Heart murmur     "born w/it"   . Type II diabetes mellitus   . Obesity   . Chronic systolic dysfunction of left ventricle   . Syncope   . Hypertension     Past Surgical History  Procedure Laterality Date  . Cholecystectomy  ~ 2010  . Cardiac catheterization  08/10/13  . Ureteroscopy      "laser for kidney stones"  . Orif humerus fracture Left 08/12/2013    Procedure: OPEN REDUCTION INTERNAL FIXATION (ORIF) HUMERAL SHAFT FRACTURE;  Surgeon: Newt Minion, MD;  Location: [place];  Service: Orthopedics;  Laterality: Left;  Open Reduction Internal Fixation Left Humerus  . Implantable cardioverter defibrillator implant  12/23/2013    STJ Fortify ICD implanted by [name] for cardiomyopathy and syncope  . Left heart catheterization with coronary angiogram N/A 08/10/2013    Procedure: LEFT HEART CATHETERIZATION WITH CORONARY ANGIOGRAM;  Surgeon: Jettie Booze, MD;  Location: [facility] CATH LAB;  Service: Cardiovascular;  Laterality: N/A;  . Implantable cardioverter defibrillator implant N/A 12/23/2013    Procedure: IMPLANTABLE CARDIOVERTER DEFIBRILLATOR IMPLANT;  Surgeon: Coralyn Mark, MD;  Location: [facility] CATH LAB;  Service: Cardiovascular;  Laterality: N/A;     Current Outpatient Prescriptions  Medication Sig Dispense Refill  . aspirin 81 MG tablet Take 81 mg by mouth daily.    . carvedilol (COREG) 12.5 MG tablet Take 1 tablet (12.5 mg total) by mouth 2 (two) times daily. 60 tablet 0  . doxycycline (VIBRAMYCIN) 100 MG capsule Take 1 capsule (100 mg total) by mouth 2 (two) times daily. 20 capsule 0  . ibuprofen (ADVIL,MOTRIN) 200 MG tablet Take 600 mg by mouth every 6 (six) hours as needed for mild pain.    Marland Kitchen losartan (COZAAR) 50 MG tablet Take 1  tablet (50 mg total) by mouth daily. 90 tablet 3  . metFORMIN (GLUCOPHAGE) 500 MG tablet Take 1 tablet (500 mg total) by mouth 2 (two) times daily with a meal. <future refills should be sent to PCP> 60 tablet 2  . minocycline (MINOCIN,DYNACIN) 100 MG capsule Take 1 capsule (100 mg total) by mouth 2 (two) times daily. 20 capsule 0  . spironolactone (ALDACTONE) 25 MG tablet Take 1 tablet (25 mg total) by mouth daily. 90 tablet 3  . furosemide (LASIX) 40 MG tablet Take 1 tablet (40 mg total) by mouth daily. TAKE 40 MG TWICE A DAY FOR ONLY THREE DAYS THEN RESUME NORMAL DOSAGE 40 MG DAILY 90 tablet 3   No current facility-administered medications for this visit.    Allergies:   Bee venom    Social History:  The patient  reports that he quit smoking about 2 months ago. His smoking use included Cigarettes. He has a 28 pack-year smoking history. He has never used smokeless tobacco. He reports that he drinks alcohol. He reports that he uses illicit drugs (Marijuana).   Family History:  The patient's family history includes Arthritis in his father and mother; Diabetes in his father and mother; Hypertension in his brother.    ROS:  General:no colds or fevers, no office weight changes but pt states he is up 17 lbs Skin:no rashes or ulcers HEENT:no blurred vision, no congestion CV:see HPI PUL:see HPI GI:no diarrhea constipation or melena, no indigestion GU:no hematuria, no dysuria MS:no joint pain, no claudication Neuro:no syncope, no lightheadedness, has been having head ache Endo:+ diabetes poorly controlled, no thyroid disease  Wt Readings from Last 3 Encounters:  09/01/14 273 lb (123.832 kg)  03/28/14 272 lb 4.8 oz (123.514 kg)  03/27/14 271 lb 6.4 oz (123.106 kg)     PHYSICAL EXAM: VS:  BP 128/78 mmHg  Pulse 73  Ht 6\' 1"  (1.854 m)  Wt 273 lb (123.832 kg)  BMI 36.03 kg/m2 , BMI Body mass index is 36.03 kg/(m^2). General:Pleasant affect, NAD Skin:Warm and dry, brisk capillary  refill HEENT:normocephalic, sclera clear, mucus membranes moist Neck:supple, mild JVD, no bruits  Heart:S1S2 RRR without murmur, gallup, rub or click Lungs:diminished breath sounds without rales, rhonchi, or wheezes [id number], soft, non tender, + BS, do not palpate liver spleen or masses Ext:no lower ext edema, 2+ pedal pulses, 2+ radial pulses Neuro:alert and oriented X 3, MAE, follows commands, + facial symmetry    EKG:  EKG is ordered today. The ekg ordered today demonstrates SR no acute changes from previous tracing, non specific T wave abnormality.    Recent Labs:  09/19/2013: Pro B Natriuretic peptide (BNP) 124.0* 12/16/2013: Hemoglobin 15.5; Platelets 261.0 12/24/2013: BUN 9; Creatinine 0.59; Potassium 4.1; Sodium 134*    Lipid Panel    Component Value Date/Time   CHOL 147 05/08/2009 1040   TRIG 135.0 05/08/2009 1040   HDL 35.50* 05/08/2009 1040   CHOLHDL 4 05/08/2009 1040   VLDL 27.0 05/08/2009 1040   LDLCALC 85 05/08/2009 1040       Other studies Reviewed: Additional studies/ records that were reviewed today include: office notes, previous labs, echo..   ASSESSMENT AND PLAN:  1. acute on Chronic systolic dysfunction decreased breath sounds, +SOB, weight increase.  Increased lasix to 40 mg BID for 3 days then back to usual dose.  Will check labs today BNP, BMP, Mag.  He has follow up with L. Roxy Horseman, [facility] 09/08/14.  He plans to go out of country on 09/15/14.   Reviewed low salt diet.  Also instructed to decrease running to 1 mile a day until better, then can increase slowly, we do not want HR so elevated that his ICD would shock him.  He has follow up pacer check 09/04/14.    2. Atypical chest pain rarely normal cors Likely musculoskeletal. We discussed this at length today. No changes or further workup today   3. NSVT/ h/o Syncope Has ICD  4. Obesity Body mass index is 36.8 kg/(m^2). I have encouraged weight loss as his weight impacts our ability to care for his  CHF.  He wants to loose wt. But does not wish to change his diet. We discussed at length.  5. Tobacco abuse He stopped smoking 06/04/15, he was congratulated.   He is not ready to quit    Current medicines are reviewed with the patient today.  The patient Has no concerns regarding medicines.  The following changes have been made:  See above Labs/ tests ordered today include:see above  Disposition:   FU:  see above  Signed, Isaiah Serge, NP  09/01/2014 4:32 PM    [facility] [place], [place], [place], [place] Phone: [phone]; Fax: [phone] \

## 2014-09-02 LAB — BRAIN NATRIURETIC PEPTIDE: Brain Natriuretic Peptide: 117.8 pg/mL — ABNORMAL HIGH (ref 0.0–100.0)

## 2014-09-04 ENCOUNTER — Ambulatory Visit (INDEPENDENT_AMBULATORY_CARE_PROVIDER_SITE_OTHER): Payer: Medicaid Other | Admitting: *Deleted

## 2014-09-04 DIAGNOSIS — Z95810 Presence of automatic (implantable) cardiac defibrillator: Secondary | ICD-10-CM

## 2014-09-04 DIAGNOSIS — I5022 Chronic systolic (congestive) heart failure: Secondary | ICD-10-CM

## 2014-09-05 ENCOUNTER — Telehealth: Payer: Self-pay | Admitting: *Deleted

## 2014-09-05 NOTE — Telephone Encounter (Signed)
-----   Message from [name] Serge, NP sent at 09/02/2014  4:48 PM EDT ----- Labs were good, go back to 40 mg lasix daily, if still SOB then get 2 view CXR.

## 2014-09-07 ENCOUNTER — Telehealth: Payer: Self-pay | Admitting: *Deleted

## 2014-09-07 NOTE — Progress Notes (Signed)
EPIC Encounter for ICM Monitoring  Patient Name: [name] is a 49 y.o. male Date: 09/07/2014 Primary Care Physican: No PCP Per Patient Primary Cardiologist: Stanford Breed Electrophysiologist: Allred Dry Weight: 260 lbs       In the past month, have you:  1. Gained more than 2 pounds in a day or more than 5 pounds in a week? Yes. The patient called the office on 3/22 with complaints of a 6 lb weight gain in 3 days. Weight today is 261 lbs.  2. Had changes in your medications (with verification of current medications)? Yes. He had a follow up appointment on 3/25 with Cecilie Kicks, PA- he was instructed to increase his lasix from 40 mg daily to 80 mg daily x 3 days. He is currently still on the higher dose.   3. Had more shortness of breath than is usual for you? Yes. He will typically run/ walk 3 miles a day. He has been more SOB with this recently. He is currently running about 1/2 mile and walking 1/2 daily.  4. Limited your activity because of shortness of breath? Yes  5. Not been able to sleep because of shortness of breath? no  6. Had increased swelling in your feet or ankles? no  7. Had symptoms of dehydration (dizziness, dry mouth, increased thirst, decreased urine output) He has felt more thirsty. He was drinking about 2 gallons of water a day and was instructed last week to decrease this to 1 gallon a day. He states he has cut back "a little." His sugars have also been running about 318 lately. He thinks this is due in part to an infection in his mouth and he is pending having 3 teeth removed next month.   8. Had changes in sodium restriction? Yes. He was previously eating bacon/ sausage. He has not done this since his office visit last week. He does state he uses salad dressings.   9. Been compliant with medication? Yes   ICM trend:   Follow-up plan: ICM clinic phone appointment: Pending. The patient is due to follow up with Truitt Merle, NP tomorrow. I advised the patient  to continue lasix 80 mg daily through today. His most recent echo was 10/2013- EF was 30-35%. He may need a chest x-ray. I will see what Lori's plan is on 4/1 and schedule ICM follow at that time. Corvue readings were up 2/13-2/24 (last month his lasix was increased to 80 mg daily x 3 days), 3/7-3/15, and again from 3/19-3/28. No other changes made today. Discussion was had regarding paying strict attention to his sodium and fluid intake. He voices understanding.   Copy of note sent to patient's primary care physician, primary cardiologist, and device following physician.  Alvis Lemmings, RN, BSN 09/07/2014 10:55 AM

## 2014-09-07 NOTE — Telephone Encounter (Signed)
I spoke with the patient. 

## 2014-09-07 NOTE — Telephone Encounter (Signed)
ICM transmission received. I left a message for the patient to call on his cell #/ attempted to call his home #- unable to leave a voice mail.

## 2014-09-08 ENCOUNTER — Ambulatory Visit (INDEPENDENT_AMBULATORY_CARE_PROVIDER_SITE_OTHER): Payer: Medicaid Other | Admitting: Nurse Practitioner

## 2014-09-08 ENCOUNTER — Encounter: Payer: Self-pay | Admitting: Nurse Practitioner

## 2014-09-08 ENCOUNTER — Encounter: Payer: Self-pay | Admitting: *Deleted

## 2014-09-08 ENCOUNTER — Ambulatory Visit
Admission: RE | Admit: 2014-09-08 | Discharge: 2014-09-08 | Disposition: A | Discharge status: Home / Self Care | Payer: Medicaid Other | Source: Ambulatory Visit | Attending: Nurse Practitioner | Admitting: Nurse Practitioner

## 2014-09-08 VITALS — BP 124/82 | HR 74 | Resp 20 | Ht 73.0 in | Wt 267.0 lb

## 2014-09-08 DIAGNOSIS — E1165 Type 2 diabetes mellitus with hyperglycemia: Secondary | ICD-10-CM

## 2014-09-08 DIAGNOSIS — I429 Cardiomyopathy, unspecified: Secondary | ICD-10-CM | POA: Diagnosis not present

## 2014-09-08 DIAGNOSIS — I428 Other cardiomyopathies: Secondary | ICD-10-CM

## 2014-09-08 DIAGNOSIS — R0600 Dyspnea, unspecified: Secondary | ICD-10-CM

## 2014-09-08 DIAGNOSIS — IMO0002 Reserved for concepts with insufficient information to code with codable children: Secondary | ICD-10-CM

## 2014-09-08 LAB — BRAIN NATRIURETIC PEPTIDE: Pro B Natriuretic peptide (BNP): 64 pg/mL (ref 0.0–100.0)

## 2014-09-08 LAB — CBC
HCT: 41.6 % (ref 39.0–52.0)
Hemoglobin: 14.2 g/dL (ref 13.0–17.0)
MCHC: 34.2 g/dL (ref 30.0–36.0)
MCV: 87.7 fl (ref 78.0–100.0)
Platelets: 258 10*3/uL (ref 150.0–400.0)
RBC: 4.74 Mil/uL (ref 4.22–5.81)
RDW: 14.2 % (ref 11.5–15.5)
WBC: 4.6 10*3/uL (ref 4.0–10.5)

## 2014-09-08 LAB — BASIC METABOLIC PANEL
BUN: 11 mg/dL (ref 6–23)
CO2: 29 mEq/L (ref 19–32)
Calcium: 9.5 mg/dL (ref 8.4–10.5)
Chloride: 100 mEq/L (ref 96–112)
Creatinine, Ser: 0.77 mg/dL (ref 0.40–1.50)
GFR: 137.59 mL/min (ref >60.00)
Glucose, Bld: 295 mg/dL — ABNORMAL HIGH (ref 70–99)
Potassium: 3.5 mEq/L (ref 3.5–5.1)
Sodium: 137 mEq/L (ref 135–145)

## 2014-09-08 LAB — HEMOGLOBIN A1C: Hgb A1c MFr Bld: 13.9 % — ABNORMAL HIGH (ref 4.6–6.5)

## 2014-09-08 LAB — HEPATIC FUNCTION PANEL
ALT: 22 U/L (ref 0–53)
AST: 15 U/L (ref 0–37)
Albumin: 4.3 g/dL (ref 3.5–5.2)
Alkaline Phosphatase: 53 U/L (ref 39–117)
Bilirubin, Direct: 0.1 mg/dL (ref 0.0–0.3)
Total Bilirubin: 0.6 mg/dL (ref 0.2–1.2)
Total Protein: 7.7 g/dL (ref 6.0–8.3)

## 2014-09-08 NOTE — Patient Instructions (Addendum)
We will be checking the following labs today BMET, CBC, A1C., HPF and BNP  Stay on current dose of lasix for now - we will call you with your labs and then decide if we need to cut back  We will refer you to Primary Care  Please go to [facility] on the first floor for a chest Xray - you may walk in.   See [name] in 4 to 6 weeks  Restrict your salt  Walking daily is ok  Limit your use of NSAID - Advil, Ibuprofen, etc. Use Tylenol instead.  Call the [place] office at [phone] if you have any questions, problems or concerns.

## 2014-09-08 NOTE — Addendum Note (Signed)
Addended by: Burtis Junes on: 09/08/2014 09:47 AM   Modules accepted: Orders

## 2014-09-08 NOTE — Progress Notes (Signed)
CARDIOLOGY OFFICE NOTE  Date:  09/08/2014    [name] Date of Birth: 02-26-1966 Medical Record [id number]  PCP:  No PCP Per Patient  Cardiologist:  Stanford Breed    Chief Complaint  Patient presents with  . Congestive Heart Failure    One week check - seen for [name].     History of Present Illness: [name] is a 49 y.o. male who presents today for a one week check. He has a hx on NICM with Echo in 2/15 revealed EF of 20-25% with diffuse hypokinesis and biatrial enlargement, mild MR and a small pericardial effusion (presented after a fall on the ice - broken shoulder and got short of breath with CXR showing pulmonary edema). TSH in February 2015 normal. Cardiac catheterization in March of 2015 showed normal coronary arteries. Echo repeated 5/15 and showed EF 30-35, biatrial enlargement, mildly dilated aortic root. ICD inserted 7/15.   Other issues include HTN, obesity, DM and GERD.  Was here a week ago with fluid overload. Weight was up. Seen by Cecilie Kicks, NP - she increased his lasix. Noted that he was running 3 miles a day. Not really controlling his sugars.   Corvue checked yesterday and phone call made to patient. Weight was down some. Sugars over 300. Was drinking 2 gallons of fluid a day. Needs 3 teeth extracted. Remained on higher dose of Lasix.   Comes in today. Here alone. Feels "bad" today. Pretty vague - notes that he feels more short of breath. Says he does not have a cold but sounds like he has URI. No chest pain. No ICD shocks. Weight is actually down. No swelling. No cough. No runny nose or drippy eyes. No fever. Planning on going on vacation to the [place] next week. Hard to say how much salt he is getting. No longer has a PCP. Sugars are uncontrolled. On Amoxicillin for tooth abscess. Planning for dental extraction when he returns from his vacation.     Past Medical History  Diagnosis Date  . Obesity   . 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 History of renal calculi   . Cardiomyopathy     a. 07/2013 Echo: EF 20-25%, diff HK, Gr2 DD, mild MR, mod dil LA/RA.  Marland Kitchen Fracture of left humerus     a. 07/2013.  Marland Kitchen Chronic bronchitis     "get it most q yr" (12/23/2013)  . Kidney stones   . GERD (gastroesophageal reflux disease)     not at present time  . Heart murmur     "born w/it"   . Type II diabetes mellitus   . Obesity   . Chronic systolic dysfunction of left ventricle   . Syncope   . Hypertension     Past Surgical History  Procedure Laterality Date  . Cholecystectomy  ~ 2010  . Cardiac catheterization  08/10/13  . Ureteroscopy      "laser for kidney stones"  . Orif humerus fracture Left 08/12/2013    Procedure: OPEN REDUCTION INTERNAL FIXATION (ORIF) HUMERAL SHAFT FRACTURE;  Surgeon: Newt Minion, MD;  Location: [facility];  Service: Orthopedics;  Laterality: Left;  Open Reduction Internal Fixation Left Humerus  . Implantable cardioverter defibrillator implant  12/23/2013    STJ Fortify ICD implanted by [name] for cardiomyopathy and syncope  . Left heart catheterization with coronary angiogram N/A 08/10/2013    Procedure: LEFT HEART CATHETERIZATION WITH CORONARY ANGIOGRAM;  Surgeon: Jettie Booze, MD;  Location: [facility] CATH LAB;  Service: Cardiovascular;  Laterality: N/A;  . Implantable cardioverter defibrillator implant N/A 12/23/2013    Procedure: IMPLANTABLE CARDIOVERTER DEFIBRILLATOR IMPLANT;  Surgeon: Coralyn Mark, MD;  Location: [facility] CATH LAB;  Service: Cardiovascular;  Laterality: N/A;     Medications: Current Outpatient Prescriptions  Medication Sig Dispense Refill  . amoxicillin (AMOXIL) 500 MG capsule Take 500 mg by mouth 2 (two) times daily.    Marland Kitchen aspirin 81 MG tablet Take 81 mg by mouth daily.    . carvedilol (COREG) 12.5 MG tablet Take 1 tablet (12.5 mg total) by mouth 2 (two) times daily. 60 tablet 0  . furosemide (LASIX) 40 MG tablet Take 1 tablet (40 mg total)  by mouth daily. TAKE 40 MG TWICE A DAY FOR ONLY THREE DAYS THEN RESUME NORMAL DOSAGE 40 MG DAILY 90 tablet 3  . ibuprofen (ADVIL,MOTRIN) 200 MG tablet Take 600 mg by mouth every 6 (six) hours as needed for mild pain.    Marland Kitchen losartan (COZAAR) 50 MG tablet Take 1 tablet (50 mg total) by mouth daily. 90 tablet 3  . metFORMIN (GLUCOPHAGE) 500 MG tablet Take 1 tablet (500 mg total) by mouth 2 (two) times daily with a meal. <future refills should be sent to PCP> 60 tablet 2  . minocycline (MINOCIN,DYNACIN) 100 MG capsule Take 1 capsule (100 mg total) by mouth 2 (two) times daily. 20 capsule 0  . spironolactone (ALDACTONE) 25 MG tablet Take 1 tablet (25 mg total) by mouth daily. 90 tablet 3   No current facility-administered medications for this visit.    Allergies: Allergies  Allergen Reactions  . Bee Venom Anaphylaxis    Social History: The patient  reports that he quit smoking about 3 months ago. His smoking use included Cigarettes. He has a 28 pack-year smoking history. He has never used smokeless tobacco. He reports that he drinks alcohol. He reports that he uses illicit drugs (Marijuana).   Family History: The patient's family history includes Arthritis in his father and mother; Diabetes in his father and mother; Hypertension in his brother.   Review of Systems: Please see the history of present illness.   Otherwise, the review of systems is positive for elevated blood sugars.   All other systems are reviewed and negative.   Physical Exam: VS:  BP 124/82 mmHg  Pulse 74  Resp 20  Ht 6\' 1"  (1.854 m)  Wt 267 lb (121.11 kg)  BMI 35.23 kg/m2  SpO2 96% .  BMI Body mass index is 35.23 kg/(m^2).   Weight is down 6 pounds.   Wt Readings from Last 3 Encounters:  09/08/14 267 lb (121.11 kg)  09/01/14 273 lb (123.832 kg)  03/28/14 272 lb 4.8 oz (123.514 kg)    General: Alert. Well developed, well nourished and in no acute distress. He sounds like he has a URI.  HEENT: Normal.His eyes look  watery to me.  Neck: Supple, no JVD, carotid bruits, or masses noted.  Cardiac: Regular rate and rhythm. No murmurs, rubs, or gallops. No edema.  Respiratory:  Lungs are clear to auscultation bilaterally with normal work of breathing.  GI: Soft and nontender.  MS: No deformity or atrophy. Gait and ROM intact. Skin: Warm and dry. Color is normal.  Neuro:  Strength and sensation are intact and no gross focal deficits noted.  Psych: Alert, appropriate and with normal affect.   LABORATORY DATA:  EKG:  EKG is not ordered today.  Lab Results  Component Value Date   WBC 6.8 09/01/2014   HGB 14.1 09/01/2014   HCT 41.0 09/01/2014   PLT 255 09/01/2014   GLUCOSE 182* 09/01/2014   CHOL 147 05/08/2009   TRIG 135.0 05/08/2009   HDL 35.50* 05/08/2009   LDLCALC 85 05/08/2009   ALT 55* 07/29/2013   AST 52* 07/29/2013   NA 139 09/01/2014   K 3.6 09/01/2014   CL 102 09/01/2014   CREATININE 0.89 09/01/2014   BUN 11 09/01/2014   CO2 26 09/01/2014   TSH 0.988 07/29/2013   INR 1.2* 08/08/2013   HGBA1C 10.8* 07/29/2013    BNP (last 3 results) No results for input(s): BNP in the last 8760 hours.  ProBNP (last 3 results)  Recent Labs  09/19/13 1600  PROBNP 124.0*     Other Studies Reviewed Today:  Echo Study Conclusions from May 2015  - Left ventricle: The cavity size was moderately dilated. Wall thickness was increased in a pattern of mild LVH. Systolic function was moderately to severely reduced. The estimated ejection fraction was in the range of 30% to 35%. Doppler parameters are consistent with abnormal left ventricular relaxation (grade 1 diastolic dysfunction). - Aorta: Aortic root dimension: 40 mm (ED). - Left atrium: The atrium was moderately dilated. - Right atrium: The atrium was mildly dilated.  Impressions:  - When compared to prior study, EF has improved.  Assessment/Plan: 1. Chronic systolic heart failure with recent exacerbation - treated with  increased dose of diuretics -  He plans to go out of country on 09/15/14.Not feeling well today - not clear as to the etiology - will recheck his labs, send for CXR. Keep on current dose of Lasix for now until I see his labs back. Needs to restrict salt. Will get him back to see [name] in 4 to 6 weeks.  2. Atypical chest pain - he has normal cors per cardiac cath last year. This is resolved.   3. NSVT/ h/o Syncope - Has ICD in place - no shocks  4. Obesity - discussed previously  5. Tobacco abuse - not smoking.  6. NIDDM - uncontrolled - no PCP - will refer to primary care. Rechecking A1C today.   Current medicines are reviewed with the patient today.  The patient does not have concerns regarding medicines other than what has been noted above.  The following changes have been made:  See above.  Labs/ tests ordered today include:    Orders Placed This Encounter  Procedures  . Brain natriuretic peptide  . Basic metabolic panel  . CBC  . Hepatic function panel  . Hemoglobin A1c  . Ambulatory referral to Internal Medicine     Disposition:   FU with [name] in 4 to 6 weeks  Patient is agreeable to this plan and will call if any problems develop in the interim.   Signed: Burtis Junes, RN, ANP-C 09/08/2014 9:29 AM  [facility] [address] [place], [place]  [address] Phone: [phone] Fax: [phone]

## 2014-09-13 ENCOUNTER — Other Ambulatory Visit: Payer: Self-pay

## 2014-09-13 MED ORDER — CARVEDILOL 12.5 MG PO TABS: 12.5000 mg | ORAL_TABLET | Freq: Two times a day (BID) | ORAL | Status: DC

## 2014-10-20 ENCOUNTER — Ambulatory Visit: Payer: Medicaid Other | Attending: Family Medicine | Admitting: Family Medicine

## 2014-10-20 ENCOUNTER — Encounter: Payer: Self-pay | Admitting: Family Medicine

## 2014-10-20 VITALS — BP 116/77 | HR 81 | Temp 98.1°F | Resp 18 | Ht 73.0 in | Wt 271.0 lb

## 2014-10-20 DIAGNOSIS — Z114 Encounter for screening for human immunodeficiency virus [HIV]: Secondary | ICD-10-CM | POA: Insufficient documentation

## 2014-10-20 DIAGNOSIS — IMO0002 Reserved for concepts with insufficient information to code with codable children: Secondary | ICD-10-CM

## 2014-10-20 DIAGNOSIS — Z87891 Personal history of nicotine dependence: Secondary | ICD-10-CM | POA: Diagnosis not present

## 2014-10-20 DIAGNOSIS — E1165 Type 2 diabetes mellitus with hyperglycemia: Secondary | ICD-10-CM | POA: Diagnosis not present

## 2014-10-20 DIAGNOSIS — E119 Type 2 diabetes mellitus without complications: Secondary | ICD-10-CM | POA: Diagnosis not present

## 2014-10-20 LAB — POCT URINALYSIS DIPSTICK
BILIRUBIN UA: NEGATIVE
GLUCOSE UA: 500
KETONES UA: NEGATIVE
Leukocytes, UA: NEGATIVE
Nitrite, UA: NEGATIVE
Protein, UA: NEGATIVE
RBC UA: NEGATIVE
Spec Grav, UA: 1.01
Urobilinogen, UA: 0.2
pH, UA: 5

## 2014-10-20 LAB — GLUCOSE, POCT (MANUAL RESULT ENTRY)
POC Glucose: 426 mg/dl — AB (ref 70–99)
POC Glucose: 348 mg/dl — AB (ref 70–99)

## 2014-10-20 MED ORDER — ACCU-CHEK AVIVA PLUS W/DEVICE KIT: 1.0000 | PACK | Freq: Three times a day (TID) | Status: DC

## 2014-10-20 MED ORDER — METFORMIN HCL 1000 MG PO TABS: 1000.0000 mg | ORAL_TABLET | Freq: Two times a day (BID) | ORAL | Status: DC

## 2014-10-20 MED ORDER — SITAGLIPTIN PHOS-METFORMIN HCL 50-1000 MG PO TABS: 1.0000 | ORAL_TABLET | Freq: Two times a day (BID) | ORAL | Status: DC

## 2014-10-20 MED ORDER — INSULIN ASPART 100 UNIT/ML ~~LOC~~ SOLN
20.0000 [IU] | Freq: Once | SUBCUTANEOUS | Status: AC
  Administered 2014-10-20: 20 [IU] via SUBCUTANEOUS

## 2014-10-20 MED ORDER — ACCU-CHEK SOFTCLIX LANCETS MISC: 1.0000 | Freq: Three times a day (TID) | Status: DC

## 2014-10-20 MED ORDER — GLUCOSE BLOOD VI STRP: 1.0000 | ORAL_STRIP | Freq: Three times a day (TID) | Status: DC

## 2014-10-20 MED ORDER — ACCU-CHEK SOFTCLIX LANCET DEV MISC: 1.0000 | Freq: Three times a day (TID) | Status: DC

## 2014-10-20 NOTE — Assessment & Plan Note (Addendum)
A: patient with CHF and uncontrolled diabetes. Patient to dietary modifications. Patient refuses home insulin therapy. Patient counseled on risk of uncontrolled blood sugars. P: Opthalmology referral placed Diabetes blood sugar goals  Fasting (in AM before breakfast, 8 hrs of no eating or drinking (except water or unsweetened coffee or tea): 90-110 2 hrs after meals: < 160,   No low sugars: nothing < 70   Testing supplies ordered  STOP metformin Start janumet 50-1000 mg twice daily    F/u with RN in 4 weeks for CBG review  See me in 3 months for diabetes

## 2014-10-20 NOTE — Progress Notes (Signed)
Establish Care F/U DM, took metformin this morning  Complaining of SOB, has cardiology appointment in two week

## 2014-10-20 NOTE — Progress Notes (Signed)
   Subjective:    Patient ID: [name], male    DOB: 16-Jun-1965, 49 y.o.   MRN: [id number] CC: establish care diabetes  HPI  1. Diabetes mellitus type 2: patient has been diabetic x 10 years. Due to CHF he has stopped running and gained weight. He also keeps a poor diet high in simple carbs. He is taking metformin 500 mg BID most days. He is monitoring his CBGs with fasting sugars consistently in the 300s.    Soc Hx: former smoker, quit  Med Hx: mixed CHF with ICD placement  Surg Hx: ICD placement  Review of Systems  Constitutional: Negative.   HENT: Positive for dental problem.   Eyes: Negative.  Negative for visual disturbance.  Respiratory: Positive for shortness of breath.   Cardiovascular: Positive for chest pain and palpitations. Negative for leg swelling.  Endocrine: Negative.   Musculoskeletal: Negative.   Skin: Negative.   Hematological: Negative.   Psychiatric/Behavioral: Negative.        Objective:   Physical Exam BP 116/77 mmHg  Pulse 81  Temp(Src) 98.1 F (36.7 C) (Oral)  Resp 18  Ht 6\' 1"  (1.854 m)  Wt 271 lb (122.925 kg)  BMI 35.76 kg/m2  SpO2 95%  Wt Readings from Last 3 Encounters:  10/20/14 271 lb (122.925 kg)  09/08/14 267 lb (121.11 kg)  09/01/14 273 lb (123.832 kg)  General appearance: alert, cooperative, no distress and moderately obese Head: Normocephalic, without obvious abnormality, atraumatic Eyes: conjunctivae/corneas clear. PERRL, EOM's intact.  Throat: absent teeth, mild gingival erythema, no swelling or drainage  Neck: no adenopathy, supple, symmetrical, trachea midline and thyroid not enlarged, symmetric, no tenderness/mass/nodules Lungs: clear to auscultation bilaterally Heart: regular rate and rhythm, S1, S2 normal, no murmur, click, rub or gallop  Abdomen: soft, non-tender; bowel sounds normal; no masses,  no organomegaly Extremities: extremities normal, atraumatic, no cyanosis or edema Pulses: 2+ and symmetric Skin: Skin color,  texture, turgor normal. No rashes or lesions  Lab Results  Component Value Date   HGBA1C 13.9* 09/08/2014   CBG 426 > 348 following 20 U of novolog   UA: 500 glucose, negative ketones     Assessment & Plan:

## 2014-10-20 NOTE — Patient Instructions (Addendum)
[name],  Thank you for coming in today. It was a pleasure meeting you. I look forward to being your primary doctor.   1. Uncontrolled type 2 diabetes  Diabetes blood sugar goals  Fasting (in AM before breakfast, 8 hrs of no eating or drinking (except water or unsweetened coffee or tea): 90-110 2 hrs after meals: < 160,   No low sugars: nothing < 70   Testing supplies ordered  STOP metformin Start janumet 50-1000 mg twice daily    F/u with RN in 4 weeks for CBG review  See me in 3 months for diabetes   Dr. Adrian Blackwater

## 2014-10-21 LAB — MICROALBUMIN, URINE: Microalb, Ur: 0.2 mg/dL (ref <2.0)

## 2014-11-02 NOTE — Progress Notes (Signed)
HPI: FU CM. Echo 2/15 revealed EF of 20-25% with diffuse hypokinesis and biatrial enlargement, mild MR and a small pericardial effusion. TSH in February 2015 normal. Cardiac catheterization in March of 2015 showed normal coronary arteries. Echo repeated 5/15 and showed EF 30-35, biatrial enlargement, mildly dilated aortic root. ICD inserted 7/15. Since last seen, he describes dyspnea on exertion, increased weight, occasional chest pain, occasional dizziness, fatigue.  Current Outpatient Prescriptions  Medication Sig Dispense Refill  . ACCU-CHEK SOFTCLIX LANCETS lancets 1 each by Other route 3 (three) times daily. 100 each 12  . aspirin 81 MG tablet Take 81 mg by mouth daily.    . Blood Glucose Monitoring Suppl (ACCU-CHEK AVIVA PLUS) W/DEVICE KIT 1 Device by Does not apply route 3 (three) times daily after meals. 1 kit 0  . carvedilol (COREG) 12.5 MG tablet Take 1 tablet (12.5 mg total) by mouth 2 (two) times daily. 60 tablet 3  . furosemide (LASIX) 40 MG tablet Take 1 tablet (40 mg total) by mouth daily. TAKE 40 MG TWICE A DAY FOR ONLY THREE DAYS THEN RESUME NORMAL DOSAGE 40 MG DAILY (Patient taking differently: Take 40 mg by mouth 2 (two) times daily. ) 90 tablet 3  . glucose blood (ACCU-CHEK AVIVA PLUS) test strip 1 each by Other route 3 (three) times daily. 100 each 12  . Lancet Devices (ACCU-CHEK SOFTCLIX) lancets 1 each by Other route 3 (three) times daily. 1 each 0  . losartan (COZAAR) 50 MG tablet Take 1 tablet (50 mg total) by mouth daily. 90 tablet 3  . sitaGLIPtin-metformin (JANUMET) 50-1000 MG per tablet Take 1 tablet by mouth 2 (two) times daily with a meal. 60 tablet 5  . spironolactone (ALDACTONE) 25 MG tablet Take 1 tablet (25 mg total) by mouth daily. 90 tablet 3   No current facility-administered medications for this visit.     Past Medical History  Diagnosis Date  . Obesity   . 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  History of renal calculi   . Cardiomyopathy     a. 07/2013 Echo: EF 20-25%, diff HK, Gr2 DD, mild MR, mod dil LA/RA.  Marland Kitchen Fracture of left humerus     a. 07/2013.  Marland Kitchen Chronic bronchitis     "get it most q yr" (12/23/2013)  . Kidney stones   . GERD (gastroesophageal reflux disease)     not at present time  . Heart murmur     "born w/it"   . Obesity   . Chronic systolic dysfunction of left ventricle   . Syncope   . Hypertension   . Type II diabetes mellitus 2006    Past Surgical History  Procedure Laterality Date  . Cholecystectomy  ~ 2010  . Cardiac catheterization  08/10/13  . Ureteroscopy      "laser for kidney stones"  . Orif humerus fracture Left 08/12/2013    Procedure: OPEN REDUCTION INTERNAL FIXATION (ORIF) HUMERAL SHAFT FRACTURE;  Surgeon: Newt Minion, MD;  Location: [place];  Service: Orthopedics;  Laterality: Left;  Open Reduction Internal Fixation Left Humerus  . Implantable cardioverter defibrillator implant  12/23/2013    STJ Fortify ICD implanted by [name] for cardiomyopathy and syncope  . Left heart catheterization with coronary angiogram N/A 08/10/2013    Procedure: LEFT HEART CATHETERIZATION WITH CORONARY ANGIOGRAM;  Surgeon: Jettie Booze, MD;  Location: [facility] CATH LAB;  Service: Cardiovascular;  Laterality: N/A;  .  Implantable cardioverter defibrillator implant N/A 12/23/2013    Procedure: IMPLANTABLE CARDIOVERTER DEFIBRILLATOR IMPLANT;  Surgeon: Coralyn Mark, MD;  Location: [facility] CATH LAB;  Service: Cardiovascular;  Laterality: N/A;    History   Social History  . Marital Status: Single    Spouse Name: N/A  . Number of Children: N/A  . Years of Education: N/A   Occupational History  . Not on file.   Social History Main Topics  . Smoking status: Former Smoker -- 1.00 packs/day for 28 years    Types: Cigarettes    Quit date: 06/03/2014  . Smokeless tobacco: Never Used  . Alcohol Use: 0.0 oz/week    0 Standard drinks or equivalent per week     Comment:  12/23/2013 "might drink a couple times/yr"  . Drug Use: Yes    Special: Marijuana  . Sexual Activity: Yes   Other Topics Concern  . Not on file   Social History Narrative    ROS: no fevers or chills, productive cough, hemoptysis, dysphasia, odynophagia, melena, hematochezia, dysuria, hematuria, rash, seizure activity, orthopnea, PND, pedal edema, claudication. Remaining systems are negative.  Physical Exam: Well-developed well-nourished in no acute distress.  Skin is warm and dry.  HEENT is normal.  Neck is supple.  Chest is clear to auscultation with normal expansion.  Cardiovascular exam is regular rate and rhythm.  Abdominal exam nontender or distended. No masses palpated. Extremities show no edema. neuro grossly intact  ECG 09/01/2014-sinus rhythm with nonspecific T-wave changes.

## 2014-11-07 ENCOUNTER — Ambulatory Visit (INDEPENDENT_AMBULATORY_CARE_PROVIDER_SITE_OTHER): Payer: Medicaid Other | Admitting: *Deleted

## 2014-11-07 ENCOUNTER — Encounter: Payer: Self-pay | Admitting: Cardiology

## 2014-11-07 ENCOUNTER — Encounter: Payer: Self-pay | Admitting: Internal Medicine

## 2014-11-07 ENCOUNTER — Encounter: Payer: Self-pay | Admitting: *Deleted

## 2014-11-07 ENCOUNTER — Ambulatory Visit (INDEPENDENT_AMBULATORY_CARE_PROVIDER_SITE_OTHER): Payer: Medicaid Other | Admitting: Cardiology

## 2014-11-07 ENCOUNTER — Telehealth: Payer: Self-pay | Admitting: Cardiology

## 2014-11-07 VITALS — BP 122/86 | HR 74 | Ht 73.0 in | Wt 270.1 lb

## 2014-11-07 DIAGNOSIS — I519 Heart disease, unspecified: Secondary | ICD-10-CM | POA: Diagnosis not present

## 2014-11-07 DIAGNOSIS — I429 Cardiomyopathy, unspecified: Secondary | ICD-10-CM

## 2014-11-07 DIAGNOSIS — Z95810 Presence of automatic (implantable) cardiac defibrillator: Secondary | ICD-10-CM

## 2014-11-07 DIAGNOSIS — I428 Other cardiomyopathies: Secondary | ICD-10-CM

## 2014-11-07 DIAGNOSIS — R072 Precordial pain: Secondary | ICD-10-CM

## 2014-11-07 DIAGNOSIS — I5022 Chronic systolic (congestive) heart failure: Secondary | ICD-10-CM

## 2014-11-07 MED ORDER — CARVEDILOL 12.5 MG PO TABS: 18.7500 mg | ORAL_TABLET | Freq: Two times a day (BID) | ORAL | Status: DC

## 2014-11-07 NOTE — Assessment & Plan Note (Signed)
Symptoms seem out of proportion to findings on physical. Continue present dose of Lasix. Check potassium, renal function and BNP. Repeat echocardiogram.

## 2014-11-07 NOTE — Assessment & Plan Note (Signed)
Followed by electrophysiology. 

## 2014-11-07 NOTE — Patient Instructions (Signed)
Your physician recommends that you schedule a follow-up appointment in: [place] has requested that you have an echocardiogram. Echocardiography is a painless test that uses sound waves to create images of your heart. It provides your doctor with information about the size and shape of your heart and how well your heart's chambers and valves are working. This procedure takes approximately one hour. There are no restrictions for this procedure.   Your physician recommends that you return for lab work [facility] AS ECHO  INCREASE CARVEDILOL TO 18.75 MG TWICE DAILY= 1 AND 1/2 OF THE 12.5 MG TABLETS TWICE DAILY

## 2014-11-07 NOTE — Assessment & Plan Note (Signed)
Continue ARB. Increase carvedilol to 18.75 mg twice a day. Repeat echocardiogram.

## 2014-11-07 NOTE — Assessment & Plan Note (Signed)
Symptoms atypical. Previous catheterization revealed normal coronaries. Will not pursue further ischemia evaluation.

## 2014-11-07 NOTE — Telephone Encounter (Signed)
Spoke with pt and reminded pt of remote transmission that is due today. Pt verbalized understanding.   

## 2014-11-08 ENCOUNTER — Telehealth: Payer: Self-pay | Admitting: *Deleted

## 2014-11-08 NOTE — Progress Notes (Signed)
Remote ICD transmission.   

## 2014-11-08 NOTE — Telephone Encounter (Signed)
-----   Message from Boykin Nearing, MD sent at 10/23/2014  2:45 PM EDT ----- Normal urine microalbumin

## 2014-11-08 NOTE — Telephone Encounter (Signed)
Pt aware of results 

## 2014-11-09 ENCOUNTER — Encounter: Payer: Self-pay | Admitting: *Deleted

## 2014-11-09 NOTE — Addendum Note (Signed)
Addended by: Alvis Lemmings C on: 11/09/2014 01:57 PM   Modules accepted: Orders, Medications

## 2014-11-09 NOTE — Progress Notes (Addendum)
EPIC Encounter for ICM Monitoring  Patient Name: [name] is a 49 y.o. male Date: 11/09/2014 Primary Care Physican: Minerva Ends, [name] Primary Cardiologist: Stanford Breed Electrophysiologist: Allred Dry Weight: 260 lbs       In the past month, have you:  1. Gained more than 2 pounds in a day or more than 5 pounds in a week? Per the patient's report, he is noticing that his weight is trending up. Most recent weight in the office with [name] this week showed his weight to be 270 lbs. The patient states "it has never been this high."  2. Had changes in your medications (with verification of current medications)? Yes. [name] increased the patient's coreg to 18.75 mg BID on 5/31.   3. Had more shortness of breath than is usual for you? Yes. He has been more SOB over the last 3 days.   4. Limited your activity because of shortness of breath? Yes. He will typically walk 2-3 miles a day. He was only able to walk a 1/2 mile in the last day.  5. Not been able to sleep because of shortness of breath? no  6. Had increased swelling in your feet or ankles? No. He does note fullness to his belly.  7. Had symptoms of dehydration (dizziness, dry mouth, increased thirst, decreased urine output) no  8. Had changes in sodium restriction? No. He reports he has stopped eating meat. He is eating salads quite a bit.  9. Been compliant with medication? Yes   ICM trend:   Follow-up plan: ICM clinic phone appointment: 11/23/14 (2 week follow up). The patient is feeling more SOB today. His corvue readings were up from ~ 4/22-5/8 and from 5/22 - present. He reports that he quite eating meat. He is drinking about 1 gallon of water a day (down from 2 gallons). He is scheduled for a repeat echo on 11/16/14 per [name]. He is currently taking lasix 40 mg one tablet BID. I have instructed him to increase this to 40 mg two tablets (80 mg) BID x 3 days, then resume his normal dosing. I advised the  patient I would forward this report to [name] for further review and recommendations. He is agreeable.   Copy of note sent to patient's primary care physician, primary cardiologist, and device following physician.  [name] Lemmings, [name], [name] 11/09/2014 1:37 PM   ----- Message -----     From: [name] Perla, [name]     Sent: 11/09/2014  2:46 PM      To: Cristopher Estimable, [name]        Agree with increased lasix for 3 days and then resume previous dose; check bmet one week    [name]        I left a message of the patient's voice mail with Dr. Jacalyn Lefevre recommendations and that labs will need to be repeated next week when he comes in for he echo.

## 2014-11-09 NOTE — Addendum Note (Signed)
Addended by: Alvis Lemmings C on: 11/09/2014 01:47 PM   Modules accepted: Level of Service

## 2014-11-12 LAB — CUP PACEART REMOTE DEVICE CHECK
Battery Remaining Percentage: 91 %
Battery Voltage: 3.14 V
HighPow Impedance: 69 Ohm
HighPow Impedance: 69 Ohm
Lead Channel Pacing Threshold Amplitude: 0.75 V
Lead Channel Sensing Intrinsic Amplitude: 12 mV
Lead Channel Setting Pacing Pulse Width: 0.5 ms
Lead Channel Setting Sensing Sensitivity: 0.5 mV
MDC IDC MSMT BATTERY REMAINING LONGEVITY: 96 mo
MDC IDC MSMT LEADCHNL RV IMPEDANCE VALUE: 360 Ohm
MDC IDC MSMT LEADCHNL RV PACING THRESHOLD PULSEWIDTH: 0.5 ms
MDC IDC SESS DTM: 20160531194016
MDC IDC SET LEADCHNL RV PACING AMPLITUDE: 2.5 V
MDC IDC SET ZONE DETECTION INTERVAL: 250 ms
MDC IDC SET ZONE DETECTION INTERVAL: 300 ms
MDC IDC SET ZONE DETECTION INTERVAL: 345 ms
MDC IDC STAT BRADY RV PERCENT PACED: 1 %
Pulse Gen Serial Number: 7196009

## 2014-11-16 ENCOUNTER — Ambulatory Visit (HOSPITAL_COMMUNITY)
Admission: RE | Admit: 2014-11-16 | Discharge: 2014-11-16 | Disposition: A | Discharge status: Home / Self Care | Payer: Medicaid Other | Source: Ambulatory Visit | Attending: Cardiovascular Disease | Admitting: Cardiovascular Disease

## 2014-11-16 DIAGNOSIS — I429 Cardiomyopathy, unspecified: Secondary | ICD-10-CM | POA: Diagnosis not present

## 2014-11-16 DIAGNOSIS — I517 Cardiomegaly: Secondary | ICD-10-CM | POA: Insufficient documentation

## 2014-11-17 ENCOUNTER — Other Ambulatory Visit: Payer: Self-pay

## 2014-11-17 DIAGNOSIS — I429 Cardiomyopathy, unspecified: Secondary | ICD-10-CM

## 2014-11-17 LAB — BASIC METABOLIC PANEL WITH GFR
BUN: 14 mg/dL (ref 6–23)
CALCIUM: 9.5 mg/dL (ref 8.4–10.5)
CHLORIDE: 98 meq/L (ref 96–112)
CO2: 27 mEq/L (ref 19–32)
Creat: 0.78 mg/dL (ref 0.50–1.35)
GFR, Est African American: >89 mL/min
Glucose, Bld: 142 mg/dL — ABNORMAL HIGH (ref 70–99)
Potassium: 3.4 mEq/L — ABNORMAL LOW (ref 3.5–5.3)
Sodium: 139 mEq/L (ref 135–145)

## 2014-11-17 LAB — BRAIN NATRIURETIC PEPTIDE: BRAIN NATRIURETIC PEPTIDE: 36.2 pg/mL (ref 0.0–100.0)

## 2014-11-17 MED ORDER — LOSARTAN POTASSIUM 50 MG PO TABS: 50.0000 mg | ORAL_TABLET | Freq: Every day | ORAL | Status: DC

## 2014-11-20 ENCOUNTER — Telehealth: Payer: Self-pay | Admitting: Cardiology

## 2014-11-20 ENCOUNTER — Other Ambulatory Visit: Payer: Self-pay | Admitting: *Deleted

## 2014-11-20 DIAGNOSIS — I429 Cardiomyopathy, unspecified: Secondary | ICD-10-CM

## 2014-11-20 DIAGNOSIS — E876 Hypokalemia: Secondary | ICD-10-CM

## 2014-11-20 MED ORDER — LOSARTAN POTASSIUM 50 MG PO TABS: 50.0000 mg | ORAL_TABLET | Freq: Every day | ORAL | Status: DC

## 2014-11-20 MED ORDER — POTASSIUM CHLORIDE CRYS ER 20 MEQ PO TBCR: 20.0000 meq | EXTENDED_RELEASE_TABLET | Freq: Every day | ORAL | Status: DC

## 2014-11-20 NOTE — Telephone Encounter (Signed)
PA for losartan complete. Pharmacy aware to contact PCP regarding diabetes medications. [id number]

## 2014-11-20 NOTE — Telephone Encounter (Signed)
[name] called in stating that a prior authorization is needed for the pt's Losartan and Janumet prescription . Please f/u with the pharmacy   Thanks

## 2014-11-21 ENCOUNTER — Telehealth: Payer: Self-pay

## 2014-11-21 ENCOUNTER — Telehealth: Payer: Self-pay | Admitting: Family Medicine

## 2014-11-21 NOTE — Telephone Encounter (Signed)
Pt's pharmacy needs authorization to dispense refill of sitaGLIPtin-metformin (JANUMET) 50-1000 MG per tablet.

## 2014-11-21 NOTE — Telephone Encounter (Signed)
Opened in Error.

## 2014-11-22 ENCOUNTER — Other Ambulatory Visit: Payer: Self-pay | Admitting: Family Medicine

## 2014-11-22 MED ORDER — SITAGLIPTIN PHOSPHATE 100 MG PO TABS: 100.0000 mg | ORAL_TABLET | Freq: Every day | ORAL | Status: DC

## 2014-11-22 MED ORDER — METFORMIN HCL 1000 MG PO TABS: 1000.0000 mg | ORAL_TABLET | Freq: Two times a day (BID) | ORAL | Status: DC

## 2014-11-22 NOTE — Telephone Encounter (Signed)
Please inform patient, sent in [place] and metformin separately as this is preferred by medicaid.

## 2014-11-22 NOTE — Telephone Encounter (Signed)
Pt currently at pharmacy and states [facility] at [facility] still has not received scripts. Please f/u with pt.

## 2014-11-22 NOTE — Telephone Encounter (Signed)
Patient called and was informed medication has been sent to pharmacy.

## 2014-11-22 NOTE — Telephone Encounter (Signed)
Unable to contact Pt.

## 2014-11-23 ENCOUNTER — Encounter (HOSPITAL_COMMUNITY): Payer: Self-pay | Admitting: Emergency Medicine

## 2014-11-23 ENCOUNTER — Ambulatory Visit (INDEPENDENT_AMBULATORY_CARE_PROVIDER_SITE_OTHER): Payer: Medicaid Other | Admitting: *Deleted

## 2014-11-23 ENCOUNTER — Encounter: Payer: Self-pay | Admitting: *Deleted

## 2014-11-23 ENCOUNTER — Emergency Department (HOSPITAL_COMMUNITY)
Admission: EM | Admit: 2014-11-23 | Discharge: 2014-11-23 | Disposition: A | Discharge status: Home / Self Care | Payer: Medicaid Other | Source: Home / Self Care | Attending: Family Medicine | Admitting: Family Medicine

## 2014-11-23 ENCOUNTER — Emergency Department (INDEPENDENT_AMBULATORY_CARE_PROVIDER_SITE_OTHER): Payer: Medicaid Other

## 2014-11-23 DIAGNOSIS — J069 Acute upper respiratory infection, unspecified: Secondary | ICD-10-CM

## 2014-11-23 DIAGNOSIS — B9789 Other viral agents as the cause of diseases classified elsewhere: Principal | ICD-10-CM

## 2014-11-23 DIAGNOSIS — Z95810 Presence of automatic (implantable) cardiac defibrillator: Secondary | ICD-10-CM

## 2014-11-23 DIAGNOSIS — I519 Heart disease, unspecified: Secondary | ICD-10-CM

## 2014-11-23 MED ORDER — SITAGLIPTIN PHOSPHATE 100 MG PO TABS: 100.0000 mg | ORAL_TABLET | Freq: Every day | ORAL | Status: DC

## 2014-11-23 MED ORDER — METHYLPREDNISOLONE ACETATE 80 MG/ML IJ SUSP
INTRAMUSCULAR | Status: AC
  Filled 2014-11-23: qty 1

## 2014-11-23 MED ORDER — METHYLPREDNISOLONE ACETATE 80 MG/ML IJ SUSP
80.0000 mg | Freq: Once | INTRAMUSCULAR | Status: AC
  Administered 2014-11-23: 80 mg via INTRAMUSCULAR

## 2014-11-23 MED ORDER — IPRATROPIUM-ALBUTEROL 0.5-2.5 (3) MG/3ML IN SOLN
3.0000 mL | Freq: Once | RESPIRATORY_TRACT | Status: AC
  Administered 2014-11-23: 3 mL via RESPIRATORY_TRACT

## 2014-11-23 MED ORDER — HYDROCODONE-ACETAMINOPHEN 5-325 MG PO TABS: 0.5000 | ORAL_TABLET | Freq: Every evening | ORAL | Status: DC | PRN

## 2014-11-23 MED ORDER — METFORMIN HCL 1000 MG PO TABS: 1000.0000 mg | ORAL_TABLET | Freq: Two times a day (BID) | ORAL | Status: DC

## 2014-11-23 MED ORDER — IPRATROPIUM BROMIDE 0.06 % NA SOLN: 2.0000 | Freq: Four times a day (QID) | NASAL | Status: DC

## 2014-11-23 MED ORDER — IPRATROPIUM-ALBUTEROL 0.5-2.5 (3) MG/3ML IN SOLN
RESPIRATORY_TRACT | Status: AC
Start: 2014-11-23 — End: 2014-11-23
  Filled 2014-11-23: qty 3

## 2014-11-23 NOTE — Telephone Encounter (Signed)
Scripts for [place] and metformin resent

## 2014-11-23 NOTE — Progress Notes (Signed)
EPIC Encounter for ICM Monitoring  Patient Name: [name] is a 49 y.o. male Date: 11/23/2014 Primary Care Physican: Minerva Ends, MD Primary Cardiologist: Stanford Breed Electrophysiologist: Allred Dry Weight: 260 lbs       In the past month, have you:  1. Gained more than 2 pounds in a day or more than 5 pounds in a week? The patient reports his weight was up a few days ago and he took an extra lasix due to some labored breathing.  2. Had changes in your medications (with verification of current medications)? He was seen by Urgent Care to for an URI- he was given a depo-medrol injection.   3. Had more shortness of breath than is usual for you? Currently has an URI.  4. Limited your activity because of shortness of breath? no  5. Not been able to sleep because of shortness of breath? no  6. Had increased swelling in your feet or ankles? no  7. Had symptoms of dehydration (dizziness, dry mouth, increased thirst, decreased urine output) no  8. Had changes in sodium restriction? no  9. Been compliant with medication? Yes   ICM trend:   Follow-up plan: ICM clinic phone appointment: 12/25/14. This is a 2 week f/u check on the patient's corvue readings. They were elevated from ~ 5/22-6/4. When I spoke with him on 5/31, he was instructed to increase lasix to 80 mg BID x 3 days, then resume his normal dosing. Readings are currently at baseline. No changes made today.  Copy of note sent to patient's primary care physician, primary cardiologist, and device following physician.  Alvis Lemmings, RN, BSN 11/23/2014 5:12 PM

## 2014-11-23 NOTE — ED Notes (Signed)
Pt states that he has had a cough and congestion since 11/21/2014

## 2014-11-23 NOTE — ED Provider Notes (Signed)
[name] is a 49 y.o. male who presents to Urgent Care today for cough congestion and rhinorrhea postnasal drip wheezing. Symptoms present for a few days. He notes chronic shortness of breath due to heart failure but nothing significantly worse than his usual baseline. He denies any lower extremity swelling. No fevers or chills vomiting or diarrhea. He's tried some Advil which helped a little. He notes typically when he gets cyclic dose he gets steroid shot. He would like a steroid shot today.   Past Medical History  Diagnosis Date  . Obesity   . 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 History of renal calculi   . Cardiomyopathy     a. 07/2013 Echo: EF 20-25%, diff HK, Gr2 DD, mild MR, mod dil LA/RA.  . Fracture of left humerus     a. 07/2013.  . Chronic bronchitis     "get it most q yr" (12/23/2013)  . Kidney stones   . GERD (gastroesophageal reflux disease)     not at present time  . Heart murmur     "born w/it"   . Obesity   . Chronic systolic dysfunction of left ventricle   . Syncope   . Hypertension   . Type II diabetes mellitus 2006   Past Surgical History  Procedure Laterality Date  . Cholecystectomy  ~ 2010  . Cardiac catheterization  08/10/13  . Ureteroscopy      "laser for kidney stones"  . Orif humerus fracture Left 08/12/2013    Procedure: OPEN REDUCTION INTERNAL FIXATION (ORIF) HUMERAL SHAFT FRACTURE;  Surgeon: Marcus V Duda, MD;  Location: [facility] OR;  Service: Orthopedics;  Laterality: Left;  Open Reduction Internal Fixation Left Humerus  . Implantable cardioverter defibrillator implant  12/23/2013    STJ Fortify ICD implanted by Dr Allred for cardiomyopathy and syncope  . Left heart catheterization with coronary angiogram N/A 08/10/2013    Procedure: LEFT HEART CATHETERIZATION WITH CORONARY ANGIOGRAM;  Surgeon: Jayadeep S Varanasi, MD;  Location: [facility] CATH LAB;  Service: Cardiovascular;  Laterality: N/A;  . Implantable  cardioverter defibrillator implant N/A 12/23/2013    Procedure: IMPLANTABLE CARDIOVERTER DEFIBRILLATOR IMPLANT;  Surgeon: [name] D Allred, MD;  Location: [facility] CATH LAB;  Service: Cardiovascular;  Laterality: N/A;   History  Substance Use Topics  . Smoking status: Former Smoker -- 1.00 packs/day for 28 years    Types: Cigarettes    Quit date: 06/03/2014  . Smokeless tobacco: Never Used  . Alcohol Use: 0.0 oz/week    0 Standard drinks or equivalent per week     Comment: 12/23/2013 "might drink a couple times/yr"   ROS as above Medications: Current Facility-Administered Medications  Medication Dose Route Frequency Provider Last Rate Last Dose  . methylPREDNISolone acetate (DEPO-MEDROL) injection 80 mg  80 mg Intramuscular Once Evan S Corey, MD       Current Outpatient Prescriptions  Medication Sig Dispense Refill  . ACCU-CHEK SOFTCLIX LANCETS lancets 1 each by Other route 3 (three) times daily. 100 each 12  . aspirin 81 MG tablet Take 81 mg by mouth daily.    . Blood Glucose Monitoring Suppl (ACCU-CHEK AVIVA PLUS) W/DEVICE KIT 1 Device by Does not apply route 3 (three) times daily after meals. 1 kit 0  . carvedilol (COREG) 12.5 MG tablet Take 1.5 tablets (18.75 mg total) by mouth 2 (two) times daily. 90 tablet 11  . furosemide (LASIX) 40 MG tablet Take one tablet by mouth twice daily    .   glucose blood (ACCU-CHEK AVIVA PLUS) test strip 1 each by Other route 3 (three) times daily. 100 each 12  . HYDROcodone-acetaminophen (NORCO/VICODIN) 5-325 MG per tablet Take 0.5 tablets by mouth at bedtime as needed (cough). 6 tablet 0  . ipratropium (ATROVENT) 0.06 % nasal spray Place 2 sprays into both nostrils 4 (four) times daily. 15 mL 1  . Lancet Devices (ACCU-CHEK SOFTCLIX) lancets 1 each by Other route 3 (three) times daily. 1 each 0  . losartan (COZAAR) 50 MG tablet Take 1 tablet (50 mg total) by mouth daily. 90 tablet 3  . metFORMIN (GLUCOPHAGE) 1000 MG tablet Take 1 tablet (1,000 mg total) by  mouth 2 (two) times daily with a meal. 180 tablet 3  . potassium chloride SA (K-DUR,KLOR-CON) 20 MEQ tablet Take 1 tablet (20 mEq total) by mouth daily. 90 tablet 3  . sitaGLIPtin (JANUVIA) 100 MG tablet Take 1 tablet (100 mg total) by mouth daily. 90 tablet 3  . spironolactone (ALDACTONE) 25 MG tablet Take 1 tablet (25 mg total) by mouth daily. 90 tablet 3   Allergies  Allergen Reactions  . Bee Venom Anaphylaxis     Exam:  BP 134/98 mmHg  Pulse 86  Temp(Src) 98.3 F (36.8 C) (Oral)  Resp 24  SpO2 95% Gen: Well NAD HEENT: EOMI,  MMM clear nasal discharge. Normal tympanic membranes bilaterally. Posterior pharynx with cobblestoning. Lungs: Normal work of breathing. CTABL Heart: RRR no MRG Abd: NABS, Soft. Nondistended, Nontender Exts: Brisk capillary refill, warm and well perfused. No edema bilateral lower extremities.  Patient was given a 2.5/0.5 mg DuoNeb nebulizer treatment, and did not feel much better. Patient was given 80 mg intramuscular Depo-Medrol prior to discharge.  No results found for this or any previous visit (from the past 24 hour(s)). Dg Chest 2 View  11/23/2014   CLINICAL DATA:  Initial encounter for sick with cold for 4 days. Cough. Sneezing. Shortness of breath. Ex-smoker.  EXAM: CHEST  2 VIEW  COMPARISON:  09/08/2014  FINDINGS: Single lead pacer/AICD device. Midline trachea. Mild cardiomegaly. Mediastinal contours otherwise within normal limits. No pleural effusion or pneumothorax. No congestive failure. Clear lungs.  IMPRESSION: Cardiomegaly without congestive failure.   Electronically Signed   By: Kyle  Talbot M.D.   On: 11/23/2014 15:46    Assessment and Plan: 49 y.o. male with viral URI with cough. Likely etiology. Discussed options. Patient is quite symptomatic. I feel that 1 dose of Depo-Medrol is reasonable. Will control symptoms with Atrovent nasal spray and Norco for cough. Follow-up with PCP.  Discussed warning signs or symptoms. Please see discharge  instructions. Patient expresses understanding.     Evan S Corey, MD 11/23/14 1603 

## 2014-11-23 NOTE — Discharge Instructions (Signed)
Thank you for coming in today. Call or go to the emergency room if you get worse, have trouble breathing, have chest pains, or palpitations.    Upper Respiratory Infection, Adult An upper respiratory infection (URI) is also sometimes known as the common cold. The upper respiratory tract includes the nose, sinuses, throat, trachea, and bronchi. Bronchi are the airways leading to the lungs. Most people improve within 1 week, but symptoms can last up to 2 weeks. A residual cough may last even longer.  CAUSES Many different viruses can infect the tissues lining the upper respiratory tract. The tissues become irritated and inflamed and often become very moist. Mucus production is also common. A cold is contagious. You can easily spread the virus to others by oral contact. This includes kissing, sharing a glass, coughing, or sneezing. Touching your mouth or nose and then touching a surface, which is then touched by another person, can also spread the virus. SYMPTOMS  Symptoms typically develop 1 to 3 days after you come in contact with a cold virus. Symptoms vary from person to person. They may include:  Runny nose.  Sneezing.  Nasal congestion.  Sinus irritation.  Sore throat.  Loss of voice (laryngitis).  Cough.  Fatigue.  Muscle aches.  Loss of appetite.  Headache.  Low-grade fever. DIAGNOSIS  You might diagnose your own cold based on familiar symptoms, since most people get a cold 2 to 3 times a year. Your caregiver can confirm this based on your exam. Most importantly, your caregiver can check that your symptoms are not due to another disease such as strep throat, sinusitis, pneumonia, asthma, or epiglottitis. Blood tests, throat tests, and X-rays are not necessary to diagnose a common cold, but they may sometimes be helpful in excluding other more serious diseases. Your caregiver will decide if any further tests are required. RISKS AND COMPLICATIONS  You may be at risk for a more  severe case of the common cold if you smoke cigarettes, have chronic heart disease (such as heart failure) or lung disease (such as asthma), or if you have a weakened immune system. The very young and very old are also at risk for more serious infections. Bacterial sinusitis, middle ear infections, and bacterial pneumonia can complicate the common cold. The common cold can worsen asthma and chronic obstructive pulmonary disease (COPD). Sometimes, these complications can require emergency medical care and may be life-threatening. PREVENTION  The best way to protect against getting a cold is to practice good hygiene. Avoid oral or hand contact with people with cold symptoms. Wash your hands often if contact occurs. There is no clear evidence that vitamin C, vitamin E, echinacea, or exercise reduces the chance of developing a cold. However, it is always recommended to get plenty of rest and practice good nutrition. TREATMENT  Treatment is directed at relieving symptoms. There is no cure. Antibiotics are not effective, because the infection is caused by a virus, not by bacteria. Treatment may include:  Increased fluid intake. Sports drinks offer valuable electrolytes, sugars, and fluids.  Breathing heated mist or steam (vaporizer or shower).  Eating chicken soup or other clear broths, and maintaining good nutrition.  Getting plenty of rest.  Using gargles or lozenges for comfort.  Controlling fevers with ibuprofen or acetaminophen as directed by your caregiver.  Increasing usage of your inhaler if you have asthma. Zinc gel and zinc lozenges, taken in the first 24 hours of the common cold, can shorten the duration and lessen the severity  of symptoms. Pain medicines may help with fever, muscle aches, and throat pain. A variety of non-prescription medicines are available to treat congestion and runny nose. Your caregiver can make recommendations and may suggest nasal or lung inhalers for other symptoms.    HOME CARE INSTRUCTIONS   Only take over-the-counter or prescription medicines for pain, discomfort, or fever as directed by your caregiver.  Use a warm mist humidifier or inhale steam from a shower to increase air moisture. This may keep secretions moist and make it easier to breathe.  Drink enough water and fluids to keep your urine clear or pale yellow.  Rest as needed.  Return to work when your temperature has returned to normal or as your caregiver advises. You may need to stay home longer to avoid infecting others. You can also use a face mask and careful hand washing to prevent spread of the virus. SEEK MEDICAL CARE IF:   After the first few days, you feel you are getting worse rather than better.  You need your caregiver's advice about medicines to control symptoms.  You develop chills, worsening shortness of breath, or brown or red sputum. These may be signs of pneumonia.  You develop yellow or brown nasal discharge or pain in the face, especially when you bend forward. These may be signs of sinusitis.  You develop a fever, swollen neck glands, pain with swallowing, or white areas in the back of your throat. These may be signs of strep throat. SEEK IMMEDIATE MEDICAL CARE IF:   You have a fever.  You develop severe or persistent headache, ear pain, sinus pain, or chest pain.  You develop wheezing, a prolonged cough, cough up blood, or have a change in your usual mucus (if you have chronic lung disease).  You develop sore muscles or a stiff neck. Document Released: 11/19/2000 Document Revised: 08/18/2011 Document Reviewed: 08/31/2013 [facility] Patient Information 2015 [place], [place]. This information is not intended to replace advice given to you by your health care provider. Make sure you discuss any questions you have with your health care provider.

## 2014-11-25 ENCOUNTER — Encounter (HOSPITAL_COMMUNITY): Payer: Self-pay

## 2014-11-25 ENCOUNTER — Emergency Department (HOSPITAL_COMMUNITY)
Admission: EM | Admit: 2014-11-25 | Discharge: 2014-11-25 | Disposition: A | Discharge status: Home / Self Care | Payer: Medicaid Other | Source: Home / Self Care | Attending: Family Medicine | Admitting: Family Medicine

## 2014-11-25 DIAGNOSIS — R05 Cough: Secondary | ICD-10-CM

## 2014-11-25 DIAGNOSIS — R059 Cough, unspecified: Secondary | ICD-10-CM

## 2014-11-25 DIAGNOSIS — J988 Other specified respiratory disorders: Secondary | ICD-10-CM | POA: Diagnosis not present

## 2014-11-25 DIAGNOSIS — J22 Unspecified acute lower respiratory infection: Secondary | ICD-10-CM

## 2014-11-25 MED ORDER — ALBUTEROL SULFATE (2.5 MG/3ML) 0.083% IN NEBU
2.5000 mg | INHALATION_SOLUTION | Freq: Once | RESPIRATORY_TRACT | Status: AC
  Administered 2014-11-25: 2.5 mg via RESPIRATORY_TRACT

## 2014-11-25 MED ORDER — METHYLPREDNISOLONE ACETATE 80 MG/ML IJ SUSP
INTRAMUSCULAR | Status: AC
  Filled 2014-11-25: qty 1

## 2014-11-25 MED ORDER — GUAIFENESIN-CODEINE 100-10 MG/5ML PO SOLN: 5.0000 mL | Freq: Three times a day (TID) | ORAL | Status: DC | PRN

## 2014-11-25 MED ORDER — AZITHROMYCIN 250 MG PO TABS: 250.0000 mg | ORAL_TABLET | Freq: Every day | ORAL | Status: DC

## 2014-11-25 MED ORDER — METHYLPREDNISOLONE ACETATE 80 MG/ML IJ SUSP
80.0000 mg | Freq: Once | INTRAMUSCULAR | Status: AC
  Administered 2014-11-25: 80 mg via INTRAMUSCULAR

## 2014-11-25 MED ORDER — ALBUTEROL SULFATE (2.5 MG/3ML) 0.083% IN NEBU
INHALATION_SOLUTION | RESPIRATORY_TRACT | Status: AC
  Filled 2014-11-25: qty 3

## 2014-11-25 NOTE — ED Provider Notes (Signed)
CSN: [id number]     Arrival date & time 11/25/14  1309 History   First MD Initiated Contact with Patient 11/25/14 1410     Chief Complaint  Patient presents with  . Cough   (Consider location/radiation/quality/duration/timing/severity/associated sxs/prior Treatment) HPI  [name] is a 49 y.o. male who presents to Urgent Care today for cough congestion and fever. Symptoms present for a few days. He notes chronic shortness of breath due to heart failure but nothing significantly worse than his usual baseline. He denies any lower extremity swelling. No fevers or chills vomiting or diarrhea.  Has some wheezing which started a few days ago.    Past Medical History  Diagnosis Date  . Obesity   . 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 History of renal calculi   . Cardiomyopathy     a. 07/2013 Echo: EF 20-25%, diff HK, Gr2 DD, mild MR, mod dil LA/RA.  Marland Kitchen Fracture of left humerus     a. 07/2013.  Marland Kitchen Chronic bronchitis     "get it most q yr" (12/23/2013)  . Kidney stones   . GERD (gastroesophageal reflux disease)     not at present time  . Heart murmur     "born w/it"   . Obesity   . Chronic systolic dysfunction of left ventricle   . Syncope   . Hypertension   . Type II diabetes mellitus 2006   Past Surgical History  Procedure Laterality Date  . Cholecystectomy  ~ 2010  . Cardiac catheterization  08/10/13  . Ureteroscopy      "laser for kidney stones"  . Orif humerus fracture Left 08/12/2013    Procedure: OPEN REDUCTION INTERNAL FIXATION (ORIF) HUMERAL SHAFT FRACTURE;  Surgeon: Newt Minion, MD;  Location: [facility];  Service: Orthopedics;  Laterality: Left;  Open Reduction Internal Fixation Left Humerus  . Implantable cardioverter defibrillator implant  12/23/2013    STJ Fortify ICD implanted by [name] for cardiomyopathy and syncope  . Left heart catheterization with coronary angiogram N/A 08/10/2013    Procedure: LEFT HEART  CATHETERIZATION WITH CORONARY ANGIOGRAM;  Surgeon: Jettie Booze, MD;  Location: [facility] CATH LAB;  Service: Cardiovascular;  Laterality: N/A;  . Implantable cardioverter defibrillator implant N/A 12/23/2013    Procedure: IMPLANTABLE CARDIOVERTER DEFIBRILLATOR IMPLANT;  Surgeon: Coralyn Mark, MD;  Location: [facility] CATH LAB;  Service: Cardiovascular;  Laterality: N/A;   Family History  Problem Relation Age of Onset  . Diabetes Father   . Arthritis Father   . Diabetes Mother   . Arthritis Mother   . Hypertension Brother    History  Substance Use Topics  . Smoking status: Former Smoker -- 1.00 packs/day for 28 years    Types: Cigarettes    Quit date: 06/03/2014  . Smokeless tobacco: Never Used  . Alcohol Use: 0.0 oz/week    0 Standard drinks or equivalent per week     Comment: 12/23/2013 "might drink a couple times/yr"    Review of Systems  Allergies  Bee venom  Home Medications   Prior to Admission medications   Medication Sig Start Date End Date Taking? Authorizing Provider  ACCU-CHEK SOFTCLIX LANCETS lancets 1 each by Other route 3 (three) times daily. 10/20/14   Boykin Nearing, MD  aspirin 81 MG tablet Take 81 mg by mouth daily.    Historical Provider, MD  azithromycin (ZITHROMAX) 250 MG tablet Take 1 tablet (250 mg total) by mouth daily. Take  first 2 tablets together, then 1 every day until finished. 11/25/14   Alveda Reasons, MD  Blood Glucose Monitoring Suppl (ACCU-CHEK AVIVA PLUS) W/DEVICE KIT 1 Device by Does not apply route 3 (three) times daily after meals. 10/20/14   Josalyn Funches, MD  carvedilol (COREG) 12.5 MG tablet Take 1.5 tablets (18.75 mg total) by mouth 2 (two) times daily. 11/07/14   Lelon Perla, MD  furosemide (LASIX) 40 MG tablet Take one tablet by mouth twice daily    Historical Provider, MD  glucose blood (ACCU-CHEK AVIVA PLUS) test strip 1 each by Other route 3 (three) times daily. 10/20/14   Josalyn Funches, MD  guaiFENesin-codeine 100-10 MG/5ML syrup  Take 5 mLs by mouth 3 (three) times daily as needed for cough. 11/25/14   Alveda Reasons, MD  ipratropium (ATROVENT) 0.06 % nasal spray Place 2 sprays into both nostrils 4 (four) times daily. 11/23/14   Gregor Hams, MD  Lancet Devices [facility]) lancets 1 each by Other route 3 (three) times daily. 10/20/14   Josalyn Funches, MD  losartan (COZAAR) 50 MG tablet Take 1 tablet (50 mg total) by mouth daily. 11/20/14   Lelon Perla, MD  metFORMIN (GLUCOPHAGE) 1000 MG tablet Take 1 tablet (1,000 mg total) by mouth 2 (two) times daily with a meal. 11/23/14   Josalyn Funches, MD  potassium chloride SA (K-DUR,KLOR-CON) 20 MEQ tablet Take 1 tablet (20 mEq total) by mouth daily. 11/20/14   Lelon Perla, MD  sitaGLIPtin (JANUVIA) 100 MG tablet Take 1 tablet (100 mg total) by mouth daily. 11/23/14   Boykin Nearing, MD  spironolactone (ALDACTONE) 25 MG tablet Take 1 tablet (25 mg total) by mouth daily. 12/22/13   Lelon Perla, MD   BP 104/68 mmHg  Pulse 89  Temp(Src) 98 F (36.7 C) (Oral)  Resp 22  SpO2 98% Physical Exam  BP 104/68 mmHg  Pulse 89  Temp(Src) 98 F (36.7 C) (Oral)  Resp 22  SpO2 98% Gen: Well NAD HEENT: EOMI,  MMM.  Nasal cobblestoning present BL  Lungs: Diffuse wheezing in all lung fields.  Good air movement.  Speaking in full sentences.  Heart: RRR no MRG Exts: Non edematous BL  LE, warm and well perfused.   S/p 2.5 mg Albuterol nebulizer treatment, patient felt better.  Given 80 mg Depo-medrol prior to DC for possibly undiagnosed COPD exacerbation.  Patient declined PO prednisone.    ED Course  Procedures (including critical care time) Labs Review Labs Reviewed - No data to display  Imaging Review Dg Chest 2 View  11/23/2014   CLINICAL DATA:  Initial encounter for sick with cold for 4 days. Cough. Sneezing. Shortness of breath. Ex-smoker.  EXAM: CHEST  2 VIEW  COMPARISON:  09/08/2014  FINDINGS: Single lead pacer/AICD device. Midline trachea. Mild  cardiomegaly. Mediastinal contours otherwise within normal limits. No pleural effusion or pneumothorax. No congestive failure. Clear lungs.  IMPRESSION: Cardiomegaly without congestive failure.   Electronically Signed   By: Abigail Miyamoto M.D.   On: 11/23/2014 15:46     MDM   1. LRTI (lower respiratory tract infection)   2. Cough    49 yo M with LRTI.  Repeat of depo-medrol today as so symptomatic.  Wheezing much improved with albuterol.  Did NOT pick up hydrocodone. Prescribed codeine-guaifenisen for symptomatic control.  FU with PCP.     Alveda Reasons, MD 11/25/14 [phone]

## 2014-11-25 NOTE — ED Notes (Signed)
C/o worsening cough since [facility] visit 6-16 w yellow secretions

## 2014-11-25 NOTE — Discharge Instructions (Signed)
You have a lower respiratory tract infection.  Follow up with your regular doctor for a discussion of asthma or chronic bronchitis and being evaluated for this.  Take the cough syrup up to 3 times a day if you need to.    Don't pick up the hydrocodone tablets.  Use the cough syrup instead.

## 2014-11-27 ENCOUNTER — Encounter: Payer: Self-pay | Admitting: Cardiology

## 2014-12-12 ENCOUNTER — Telehealth: Payer: Self-pay | Admitting: Cardiology

## 2014-12-12 DIAGNOSIS — I519 Heart disease, unspecified: Secondary | ICD-10-CM

## 2014-12-12 MED ORDER — FUROSEMIDE 40 MG PO TABS: ORAL_TABLET | ORAL | Status: DC

## 2014-12-12 NOTE — Telephone Encounter (Signed)
New message     Pt has gained 4 pounds in 24 hours.   Pt c/o Shortness Of Breath: STAT if SOB developed within the last 24 hours or pt is noticeably SOB on the phone  1. Are you currently SOB (can you hear that pt is SOB on the phone)? Yes  2. How long have you been experiencing SOB? 1 day  3. Are you SOB when sitting or when up moving around? Both  4. Are you currently experiencing any other symptoms? Gained 4 pounds in 24 hours.   Please advise

## 2014-12-12 NOTE — Telephone Encounter (Signed)
Spoke with pt, Aware of dr Jacalyn Lefevre recommendations.  New script sent to the pharmacy Lab orders mailed to the patient

## 2014-12-12 NOTE — Telephone Encounter (Signed)
Received a call from patient he stated he gained 4 lbs since yesterday.Stated he is a little sob,no edema noticed.Ocassional chest pain.Stated he did not eat salt yesterday.He normally takes lasix 40 mg twice a day.He took lasix 80 mg yesterday morning and 40 mg in pm.Stated he wanted to know if he needed to increase lasix.Message sent to [facility] for advice.

## 2014-12-12 NOTE — Telephone Encounter (Signed)
Change lasix to 80 mg q am and 40 q pm Bmet one week [name]

## 2014-12-15 ENCOUNTER — Ambulatory Visit: Payer: Medicaid Other | Attending: Family Medicine | Admitting: *Deleted

## 2014-12-15 VITALS — BP 111/74 | HR 84 | Temp 97.9°F | Resp 20 | Ht 73.0 in | Wt 257.6 lb

## 2014-12-15 DIAGNOSIS — E1165 Type 2 diabetes mellitus with hyperglycemia: Secondary | ICD-10-CM | POA: Diagnosis not present

## 2014-12-15 DIAGNOSIS — Z87891 Personal history of nicotine dependence: Secondary | ICD-10-CM | POA: Diagnosis not present

## 2014-12-15 LAB — POCT GLYCOSYLATED HEMOGLOBIN (HGB A1C): Hemoglobin A1C: 10.9

## 2014-12-15 LAB — GLUCOSE, POCT (MANUAL RESULT ENTRY): POC GLUCOSE: 192 mg/dL — AB (ref 70–99)

## 2014-12-15 NOTE — Patient Instructions (Addendum)
Diabetic Neuropathy Diabetic neuropathy is a nerve disease or nerve damage that is caused by diabetes mellitus. About half of all people with diabetes mellitus have some form of nerve damage. Nerve damage is more common in those who have had diabetes mellitus for many years and who generally have not had good control of their blood sugar (glucose) level. Diabetic neuropathy is a common complication of diabetes mellitus. There are three more common types of diabetic neuropathy and a fourth type that is less common and less understood:   Peripheral neuropathy--This is the most common type of diabetic neuropathy. It causes damage to the nerves of the feet and legs first and then eventually the hands and arms.The damage affects the ability to sense touch.  Autonomic neuropathy--This type causes damage to the autonomic nervous system, which controls the following functions:  Heartbeat.  Body temperature.  Blood pressure.  Urination.  Digestion.  Sweating.  Sexual function.  Focal neuropathy--Focal neuropathy can be painful and unpredictable and occurs most often in older adults with diabetes mellitus. It involves a specific nerve or one area and often comes on suddenly. It usually does not cause long-term problems.  Radiculoplexus neuropathy-- Sometimes called lumbosacral radiculoplexus neuropathy, radiculoplexus neuropathy affects the nerves of the thighs, hips, buttocks, or legs. It is more common in people with type 2 diabetes mellitus and in older men. It is characterized by debilitating pain, weakness, and atrophy, usually in the thigh muscles. CAUSES  The cause of peripheral, autonomic, and focal neuropathies is diabetes mellitus that is uncontrolled and high glucose levels. The cause of radiculoplexus neuropathy is unknown. However, it is thought to be caused by inflammation related to uncontrolled glucose levels. SIGNS AND SYMPTOMS  Peripheral Neuropathy Peripheral neuropathy develops  slowly over time. When the nerves of the feet and legs no longer work there may be:   Burning, stabbing, or aching pain in the legs or feet.  Inability to feel pressure or pain in your feet. This can lead to:  Thick calluses over pressure areas.  Pressure sores.  Ulcers.  Foot deformities.  Reduced ability to feel temperature changes.  Muscle weakness. Autonomic Neuropathy The symptoms of autonomic neuropathy vary depending on which nerves are affected. Symptoms may include:  Problems with digestion, such as:  Feeling sick to your stomach (nausea).  Vomiting.  Bloating.  Constipation.  Diarrhea.  Abdominal pain.  Difficulty with urination. This occurs if you lose your ability to sense when your bladder is full. Problems include:  Urine leakage (incontinence).  Inability to empty your bladder completely (retention).  Rapid or irregular heartbeat (palpitations).  Blood pressure drops when you stand up (orthostatic hypotension). When you stand up you may feel:  Dizzy.  Weak.  Faint.  In men, inability to attain and maintain an erection.  In women, vaginal dryness and problems with decreased sexual desire and arousal.  Problems with body temperature regulation.  Increased or decreased sweating. Focal Neuropathy  Abnormal eye movements or abnormal alignment of both eyes.  Weakness in the wrist.  Foot drop. This results in an inability to lift the foot properly and abnormal walking or foot movement.  Paralysis on one side of your face (Bell palsy).  Chest or abdominal pain. Radiculoplexus Neuropathy  Sudden, severe pain in your hip, thigh, or buttocks.  Weakness and wasting of thigh muscles.  Difficulty rising from a seated position.  Abdominal swelling.  Unexplained weight loss (usually more than 10 lb [4.5 kg]). DIAGNOSIS  Peripheral Neuropathy Your senses may   be tested. Sensory function testing can be done with:  A light touch using a  monofilament.  A vibration with tuning fork.  A sharp sensation with a pin prick. Other tests that can help diagnose neuropathy are:  Nerve conduction velocity. This test checks the transmission of an electrical current through a nerve.  Electromyography. This shows how muscles respond to electrical signals transmitted by nearby nerves.  Quantitative sensory testing. This is used to assess how your nerves respond to vibrations and changes in temperature. Autonomic Neuropathy Diagnosis is often based on reported symptoms. Tell your health care provider if you experience:   Dizziness.   Constipation.   Diarrhea.   Inappropriate urination or inability to urinate.   Inability to get or maintain an erection.  Tests that may be done include:   Electrocardiography or Holter monitor. These are tests that can help show problems with the heart rate or heart rhythm.   An X-ray exam may be done. Focal Neuropathy Diagnosis is made based on your symptoms and what your health care provider finds during your exam. Other tests may be done. They may include:  Nerve conduction velocities. This checks the transmission of electrical current through a nerve.  Electromyography. This shows how muscles respond to electrical signals transmitted by nearby nerves.  Quantitative sensory testing. This test is used to assess how your nerves respond to vibration and changes in temperature. Radiculoplexus Neuropathy  Often the first thing is to eliminate any other issue or problems that might be the cause, as there is no stick test for diagnosis.  X-ray exam of your spine and lumbar region.  Spinal tap to rule out cancer.  MRI to rule out other lesions. TREATMENT  Once nerve damage occurs, it cannot be reversed. The goal of treatment is to keep the disease or nerve damage from getting worse and affecting more nerve fibers. Controlling your blood glucose level is the key. Most people with  radiculoplexus neuropathy see at least a partial improvement over time. You will need to keep your blood glucose and HbA1c levels in the target range determined by your health care provider. Things that help control blood glucose levels include:   Blood glucose monitoring.   Meal planning.   Physical activity.   Diabetes medicine.  Over time, maintaining lower blood glucose levels helps lessen symptoms. Sometimes, prescription pain medicine is needed. HOME CARE INSTRUCTIONS:  Do not smoke.  Keep your blood glucose level in the range that you and your health care provider have determined acceptable for you.  Keep your blood pressure level in the range that you and your health care provider have determined acceptable for you.  Eat a well-balanced diet.  Be active every day.  Check your feet every day. SEEK MEDICAL CARE IF:   You have burning, stabbing, or aching pain in the legs or feet.  You are unable to feel pressure or pain in your feet.  You develop problems with digestion such as:  Nausea.  Vomiting.  Bloating.  Constipation.  Diarrhea.  Abdominal pain.  You have difficulty with urination, such as:  Incontinence.  Retention.  You have palpitations.  You develop orthostatic hypotension. When you stand up you may feel:  Dizzy.  Weak.  Faint.  You cannot attain and maintain an erection (in men).  You have vaginal dryness and problems with decreased sexual desire and arousal (in women).  You have severe pain in your thighs, legs, or buttocks.  You have unexplained weight loss.   Document Released: 08/04/2001 Document Revised: 03/16/2013 Document Reviewed: 11/04/2012 [facility] Patient Information 2015 [place], [place]. This information is not intended to replace advice given to you by your health care provider. Make sure you discuss any questions you have with your health care provider. Diabetes and Foot Care Diabetes may cause you to have problems  because of poor blood supply (circulation) to your feet and legs. This may cause the skin on your feet to become thinner, break easier, and heal more slowly. Your skin may become dry, and the skin may peel and crack. You may also have nerve damage in your legs and feet causing decreased feeling in them. You may not notice minor injuries to your feet that could lead to infections or more serious problems. Taking care of your feet is one of the most important things you can do for yourself.  HOME CARE INSTRUCTIONS  Wear shoes at all times, even in the house. Do not go barefoot. Bare feet are easily injured.  Check your feet daily for blisters, cuts, and redness. If you cannot see the bottom of your feet, use a mirror or ask someone for help.  Wash your feet with warm water (do not use hot water) and mild soap. Then pat your feet and the areas between your toes until they are completely dry. Do not soak your feet as this can dry your skin.  Apply a moisturizing lotion or petroleum jelly (that does not contain alcohol and is unscented) to the skin on your feet and to dry, brittle toenails. Do not apply lotion between your toes.  Trim your toenails straight across. Do not dig under them or around the cuticle. File the edges of your nails with an emery board or nail file.  Do not cut corns or calluses or try to remove them with medicine.  Wear clean socks or stockings every day. Make sure they are not too tight. Do not wear knee-high stockings since they may decrease blood flow to your legs.  Wear shoes that fit properly and have enough cushioning. To break in new shoes, wear them for just a few hours a day. This prevents you from injuring your feet. Always look in your shoes before you put them on to be sure there are no objects inside.  Do not cross your legs. This may decrease the blood flow to your feet.  If you find a minor scrape, cut, or break in the skin on your feet, keep it and the skin  around it clean and dry. These areas may be cleansed with mild soap and water. Do not cleanse the area with peroxide, alcohol, or iodine.  When you remove an adhesive bandage, be sure not to damage the skin around it.  If you have a wound, look at it several times a day to make sure it is healing.  Do not use heating pads or hot water bottles. They may burn your skin. If you have lost feeling in your feet or legs, you may not know it is happening until it is too late.  Make sure your health care provider performs a complete foot exam at least annually or more often if you have foot problems. Report any cuts, sores, or bruises to your health care provider immediately. SEEK MEDICAL CARE IF:   You have an injury that is not healing.  You have cuts or breaks in the skin.  You have an ingrown nail.  You notice redness on your legs or feet.  You feel burning or tingling in your legs or feet.  You have pain or cramps in your legs and feet.  Your legs or feet are numb.  Your feet always feel cold. SEEK IMMEDIATE MEDICAL CARE IF:   There is increasing redness, swelling, or pain in or around a wound.  There is a red line that goes up your leg.  Pus is coming from a wound.  You develop a fever or as directed by your health care provider.  You notice a bad smell coming from an ulcer or wound. Document Released: 05/23/2000 Document Revised: 01/26/2013 Document Reviewed: 11/02/2012 [facility] Patient Information 2015 [place], [place]. This information is not intended to replace advice given to you by your health care provider. Make sure you discuss any questions you have with your health care provider. Basic Carbohydrate Counting for Diabetes Mellitus Carbohydrate counting is a method for keeping track of the amount of carbohydrates you eat. Eating carbohydrates naturally increases the level of sugar (glucose) in your blood, so it is important for you to know the amount that is okay for you to  have in every meal. Carbohydrate counting helps keep the level of glucose in your blood within normal limits. The amount of carbohydrates allowed is different for every person. A dietitian can help you calculate the amount that is right for you. Once you know the amount of carbohydrates you can have, you can count the carbohydrates in the foods you want to eat. Carbohydrates are found in the following foods:  Grains, such as breads and cereals.  Dried beans and soy products.  Starchy vegetables, such as potatoes, peas, and corn.  Fruit and fruit juices.  Milk and yogurt.  Sweets and snack foods, such as cake, cookies, candy, chips, soft drinks, and fruit drinks. CARBOHYDRATE COUNTING There are two ways to count the carbohydrates in your food. You can use either of the methods or a combination of both. Reading the "Nutrition Facts" on [place] The "Nutrition Facts" is an area that is included on the labels of almost all packaged food and beverages in the [place]. It includes the serving size of that food or beverage and information about the nutrients in each serving of the food, including the grams (g) of carbohydrate per serving.  Decide the number of servings of this food or beverage that you will be able to eat or drink. Multiply that number of servings by the number of grams of carbohydrate that is listed on the label for that serving. The total will be the amount of carbohydrates you will be having when you eat or drink this food or beverage. Learning Standard Serving Sizes of Food When you eat food that is not packaged or does not include "Nutrition Facts" on the label, you need to measure the servings in order to count the amount of carbohydrates.A serving of most carbohydrate-rich foods contains about 15 g of carbohydrates. The following list includes serving sizes of carbohydrate-rich foods that provide 15 g ofcarbohydrate per serving:   1 slice of bread (1 oz) or 1  six-inch tortilla.    of a hamburger bun or English muffin.  4-6 crackers.   cup unsweetened dry cereal.    cup hot cereal.   cup rice or pasta.    cup mashed potatoes or  of a large baked potato.  1 cup fresh fruit or one small piece of fruit.    cup canned or frozen fruit or fruit juice.  1 cup milk.  cup plain fat-free yogurt or yogurt sweetened with artificial sweeteners.   cup cooked dried beans or starchy vegetable, such as peas, corn, or potatoes.  Decide the number of standard-size servings that you will eat. Multiply that number of servings by 15 (the grams of carbohydrates in that serving). For example, if you eat 2 cups of strawberries, you will have eaten 2 servings and 30 g of carbohydrates (2 servings x 15 g = 30 g). For foods such as soups and casseroles, in which more than one food is mixed in, you will need to count the carbohydrates in each food that is included. EXAMPLE OF CARBOHYDRATE COUNTING Sample Dinner  3 oz chicken breast.   cup of brown rice.   cup of corn.  1 cup milk.   1 cup strawberries with sugar-free whipped topping.  Carbohydrate Calculation Step 1: Identify the foods that contain carbohydrates:   Rice.   Corn.   Milk.   Strawberries. Step 2:Calculate the number of servings eaten of each:   2 servings of rice.   1 serving of corn.   1 serving of milk.   1 serving of strawberries. Step 3: Multiply each of those number of servings by 15 g:   2 servings of rice x 15 g = 30 g.   1 serving of corn x 15 g = 15 g.   1 serving of milk x 15 g = 15 g.   1 serving of strawberries x 15 g = 15 g. Step 4: Add together all of the amounts to find the total grams of carbohydrates eaten: 30 g + 15 g + 15 g + 15 g = 75 g. Document Released: 05/26/2005 Document Revised: 10/10/2013 Document Reviewed: 04/22/2013 [facility] Patient Information 2015 [place], [place]. This information is not intended to replace  advice given to you by your health care provider. Make sure you discuss any questions you have with your health care provider. Diabetes and Exercise Exercising regularly is important. It is not just about losing weight. It has many health benefits, such as:  Improving your overall fitness, flexibility, and endurance.  Increasing your bone density.  Helping with weight control.  Decreasing your body fat.  Increasing your muscle strength.  Reducing stress and tension.  Improving your overall health. People with diabetes who exercise gain additional benefits because exercise:  Reduces appetite.  Improves the body's use of blood sugar (glucose).  Helps lower or control blood glucose.  Decreases blood pressure.  Helps control blood lipids (such as cholesterol and triglycerides).  Improves the body's use of the hormone insulin by:  Increasing the body's insulin sensitivity.  Reducing the body's insulin needs.  Decreases the risk for heart disease because exercising:  Lowers cholesterol and triglycerides levels.  Increases the levels of good cholesterol (such as high-density lipoproteins [HDL]) in the body.  Lowers blood glucose levels. YOUR ACTIVITY PLAN  Choose an activity that you enjoy and set realistic goals. Your health care provider or diabetes educator can help you make an activity plan that works for you. Exercise regularly as directed by your health care provider. This includes:  Performing resistance training twice a week such as push-ups, sit-ups, lifting weights, or using resistance bands.  Performing 150 minutes of cardio exercises each week such as walking, running, or playing sports.  Staying active and spending no more than 90 minutes at one time being inactive. Even short bursts of exercise are good for you. Three 10-minute sessions spread throughout the day are  just as beneficial as a single 30-minute session. Some exercise ideas include:  Taking the  dog for a walk.  Taking the stairs instead of the elevator.  Dancing to your favorite song.  Doing an exercise video.  Doing your favorite exercise with a friend. RECOMMENDATIONS FOR EXERCISING WITH TYPE 1 OR TYPE 2 DIABETES   Check your blood glucose before exercising. If blood glucose levels are greater than 240 mg/dL, check for urine ketones. Do not exercise if ketones are present.  Avoid injecting insulin into areas of the body that are going to be exercised. For example, avoid injecting insulin into:  The arms when playing tennis.  The legs when jogging.  Keep a record of:  Food intake before and after you exercise.  Expected peak times of insulin action.  Blood glucose levels before and after you exercise.  The type and amount of exercise you have done.  Review your records with your health care provider. Your health care provider will help you to develop guidelines for adjusting food intake and insulin amounts before and after exercising.  If you take insulin or oral hypoglycemic agents, watch for signs and symptoms of hypoglycemia. They include:  Dizziness.  Shaking.  Sweating.  Chills.  Confusion.  Drink plenty of water while you exercise to prevent dehydration or heat stroke. Body water is lost during exercise and must be replaced.  Talk to your health care provider before starting an exercise program to make sure it is safe for you. Remember, almost any type of activity is better than none. Document Released: 08/16/2003 Document Revised: 10/10/2013 Document Reviewed: 11/02/2012 [facility] Patient Information 2015 [place], [place]. This information is not intended to replace advice given to you by your health care provider. Make sure you discuss any questions you have with your health care provider. Diabetes Mellitus and Food It is important for you to manage your blood sugar (glucose) level. Your blood glucose level can be greatly affected by what you eat.  Eating healthier foods in the appropriate amounts throughout the day at about the same time each day will help you control your blood glucose level. It can also help slow or prevent worsening of your diabetes mellitus. Healthy eating may even help you improve the level of your blood pressure and reach or maintain a healthy weight.  HOW CAN FOOD AFFECT ME? Carbohydrates Carbohydrates affect your blood glucose level more than any other type of food. Your dietitian will help you determine how many carbohydrates to eat at each meal and teach you how to count carbohydrates. Counting carbohydrates is important to keep your blood glucose at a healthy level, especially if you are using insulin or taking certain medicines for diabetes mellitus. Alcohol Alcohol can cause sudden decreases in blood glucose (hypoglycemia), especially if you use insulin or take certain medicines for diabetes mellitus. Hypoglycemia can be a life-threatening condition. Symptoms of hypoglycemia (sleepiness, dizziness, and disorientation) are similar to symptoms of having too much alcohol.  If your health care provider has given you approval to drink alcohol, do so in moderation and use the following guidelines:  Women should not have more than one drink per day, and men should not have more than two drinks per day. One drink is equal to:  12 oz of beer.  5 oz of wine.  1 oz of hard liquor.  Do not drink on an empty stomach.  Keep yourself hydrated. Have water, diet soda, or unsweetened iced tea.  Regular soda, juice, and other  mixers might contain a lot of carbohydrates and should be counted. WHAT FOODS ARE NOT RECOMMENDED? As you make food choices, it is important to remember that all foods are not the same. Some foods have fewer nutrients per serving than other foods, even though they might have the same number of calories or carbohydrates. It is difficult to get your body what it needs when you eat foods with fewer  nutrients. Examples of foods that you should avoid that are high in calories and carbohydrates but low in nutrients include:  Trans fats (most processed foods list trans fats on the Nutrition Facts label).  Regular soda.  Juice.  Candy.  Sweets, such as cake, pie, doughnuts, and cookies.  Fried foods. WHAT FOODS CAN I EAT? Have nutrient-rich foods, which will nourish your body and keep you healthy. The food you should eat also will depend on several factors, including:  The calories you need.  The medicines you take.  Your weight.  Your blood glucose level.  Your blood pressure level.  Your cholesterol level. You also should eat a variety of foods, including:  Protein, such as meat, poultry, fish, tofu, nuts, and seeds (lean animal proteins are best).  Fruits.  Vegetables.  Dairy products, such as milk, cheese, and yogurt (low fat is best).  Breads, grains, pasta, cereal, rice, and beans.  Fats such as olive oil, trans fat-free margarine, canola oil, avocado, and olives. DOES EVERYONE WITH DIABETES MELLITUS HAVE THE SAME MEAL PLAN? Because every person with diabetes mellitus is different, there is not one meal plan that works for everyone. It is very important that you meet with a dietitian who will help you create a meal plan that is just right for you. Document Released: 02/20/2005 Document Revised: 05/31/2013 Document Reviewed: 04/22/2013 [facility] Patient Information 2015 [place], [place]. This information is not intended to replace advice given to you by your health care provider. Make sure you discuss any questions you have with your health care provider. Type 2 Diabetes Mellitus Type 2 diabetes mellitus, often simply referred to as type 2 diabetes, is a long-lasting (chronic) disease. In type 2 diabetes, the pancreas does not make enough insulin (a hormone), the cells are less responsive to the insulin that is made (insulin resistance), or both. Normally, insulin  moves sugars from food into the tissue cells. The tissue cells use the sugars for energy. The lack of insulin or the lack of normal response to insulin causes excess sugars to build up in the blood instead of going into the tissue cells. As a result, high blood sugar (hyperglycemia) develops. The effect of high sugar (glucose) levels can cause many complications. Type 2 diabetes was also previously called adult-onset diabetes, but it can occur at any age.  RISK FACTORS  A person is predisposed to developing type 2 diabetes if someone in the family has the disease and also has one or more of the following primary risk factors:  Overweight.  An inactive lifestyle.  A history of consistently eating high-calorie foods. Maintaining a normal weight and regular physical activity can reduce the chance of developing type 2 diabetes. SYMPTOMS  A person with type 2 diabetes may not show symptoms initially. The symptoms of type 2 diabetes appear slowly. The symptoms include:  Increased thirst (polydipsia).  Increased urination (polyuria).  Increased urination during the night (nocturia).  Weight loss. This weight loss may be rapid.  Frequent, recurring infections.  Tiredness (fatigue).  Weakness.  Vision changes, such as blurred vision.  Fruity smell to your breath.  Abdominal pain.  Nausea or vomiting.  Cuts or bruises which are slow to heal.  Tingling or numbness in the hands or feet. DIAGNOSIS Type 2 diabetes is frequently not diagnosed until complications of diabetes are present. Type 2 diabetes is diagnosed when symptoms or complications are present and when blood glucose levels are increased. Your blood glucose level may be checked by one or more of the following blood tests:  A fasting blood glucose test. You will not be allowed to eat for at least 8 hours before a blood sample is taken.  A random blood glucose test. Your blood glucose is checked at any time of the day  regardless of when you ate.  A hemoglobin A1c blood glucose test. A hemoglobin A1c test provides information about blood glucose control over the previous 3 months.  An oral glucose tolerance test (OGTT). Your blood glucose is measured after you have not eaten (fasted) for 2 hours and then after you drink a glucose-containing beverage. TREATMENT   You may need to take insulin or diabetes medicine daily to keep blood glucose levels in the desired range.  If you use insulin, you may need to adjust the dosage depending on the carbohydrates that you eat with each meal or snack. The treatment goal is to maintain the before meal blood sugar (preprandial glucose) level at 70-130 mg/dL. HOME CARE INSTRUCTIONS   Have your hemoglobin A1c level checked twice a year.  Perform daily blood glucose monitoring as directed by your health care provider.  Monitor urine ketones when you are ill and as directed by your health care provider.  Take your diabetes medicine or insulin as directed by your health care provider to maintain your blood glucose levels in the desired range.  Never run out of diabetes medicine or insulin. It is needed every day.  If you are using insulin, you may need to adjust the amount of insulin given based on your intake of carbohydrates. Carbohydrates can raise blood glucose levels but need to be included in your diet. Carbohydrates provide vitamins, minerals, and fiber which are an essential part of a healthy diet. Carbohydrates are found in fruits, vegetables, whole grains, dairy products, legumes, and foods containing added sugars.  Eat healthy foods. You should make an appointment to see a registered dietitian to help you create an eating plan that is right for you.  Lose weight if you are overweight.  Carry a medical alert card or wear your medical alert jewelry.  Carry a 15-gram carbohydrate snack with you at all times to treat low blood glucose (hypoglycemia). Some  examples of 15-gram carbohydrate snacks include:  Glucose tablets, 3 or 4.  Glucose gel, 15-gram tube.  Raisins, 2 tablespoons (24 grams).  Jelly beans, 6.  Animal crackers, 8.  Regular pop, 4 ounces (120 mL).  Gummy treats, 9.  Recognize hypoglycemia. Hypoglycemia occurs with blood glucose levels of 70 mg/dL and below. The risk for hypoglycemia increases when fasting or skipping meals, during or after intense exercise, and during sleep. Hypoglycemia symptoms can include:  Tremors or shakes.  Decreased ability to concentrate.  Sweating.  Increased heart rate.  Headache.  Dry mouth.  Hunger.  Irritability.  Anxiety.  Restless sleep.  Altered speech or coordination.  Confusion.  Treat hypoglycemia promptly. If you are alert and able to safely swallow, follow the 15:15 rule:  Take 15-20 grams of rapid-acting glucose or carbohydrate. Rapid-acting options include glucose gel, glucose tablets, or 4  ounces (120 mL) of fruit juice, regular soda, or low-fat milk.  Check your blood glucose level 15 minutes after taking the glucose.  Take 15-20 grams more of glucose if the repeat blood glucose level is still 70 mg/dL or below.  Eat a meal or snack within 1 hour once blood glucose levels return to normal.  Be alert to feeling very thirsty and urinating more frequently than usual, which are early signs of hyperglycemia. An early awareness of hyperglycemia allows for prompt treatment. Treat hyperglycemia as directed by your health care provider.  Engage in at least 150 minutes of moderate-intensity physical activity a week, spread over at least 3 days of the week or as directed by your health care provider. In addition, you should engage in resistance exercise at least 2 times a week or as directed by your health care provider. Try to spend no more than 90 minutes at one time inactive.  Adjust your medicine and food intake as needed if you start a new exercise or  sport.  Follow your sick-day plan anytime you are unable to eat or drink as usual.  Do not use any tobacco products including cigarettes, chewing tobacco, or electronic cigarettes. If you need help quitting, ask your health care provider.  Limit alcohol intake to no more than 1 drink per day for nonpregnant women and 2 drinks per day for men. You should drink alcohol only when you are also eating food. Talk with your health care provider whether alcohol is safe for you. Tell your health care provider if you drink alcohol several times a week.  Keep all follow-up visits as directed by your health care provider. This is important.  Schedule an eye exam soon after the diagnosis of type 2 diabetes and then annually.  Perform daily skin and foot care. Examine your skin and feet daily for cuts, bruises, redness, nail problems, bleeding, blisters, or sores. A foot exam by a health care provider should be done annually.  Brush your teeth and gums at least twice a day and floss at least once a day. Follow up with your dentist regularly.  Share your diabetes management plan with your workplace or school.  Stay up-to-date with immunizations. It is recommended that people with diabetes who are over 43 years old get the pneumonia vaccine. In some cases, two separate shots may be given. Ask your health care provider if your pneumonia vaccination is up-to-date.  Learn to manage stress.  Obtain ongoing diabetes education and support as needed.  Participate in or seek rehabilitation as needed to maintain or improve independence and quality of life. Request a physical or occupational therapy referral if you are having foot or hand numbness, or difficulties with grooming, dressing, eating, or physical activity. SEEK MEDICAL CARE IF:   You are unable to eat food or drink fluids for more than 6 hours.  You have nausea and vomiting for more than 6 hours.  Your blood glucose level is over 240 mg/dL.  There  is a change in mental status.  You develop an additional serious illness.  You have diarrhea for more than 6 hours.  You have been sick or have had a fever for a couple of days and are not getting better.  You have pain during any physical activity.  SEEK IMMEDIATE MEDICAL CARE IF:  You have difficulty breathing.  You have moderate to large ketone levels. MAKE SURE YOU:  Understand these instructions.  Will watch your condition.  Will get help  right away if you are not doing well or get worse. Document Released: 05/26/2005 Document Revised: 10/10/2013 Document Reviewed: 12/23/2011 [facility] Patient Information 2015 [place], [place]. This information is not intended to replace advice given to you by your health care provider. Make sure you discuss any questions you have with your health care provider. DASH Eating Plan DASH stands for "Dietary Approaches to Stop Hypertension." The DASH eating plan is a healthy eating plan that has been shown to reduce high blood pressure (hypertension). Additional health benefits may include reducing the risk of type 2 diabetes mellitus, heart disease, and stroke. The DASH eating plan may also help with weight loss. WHAT DO I NEED TO KNOW ABOUT THE DASH EATING PLAN? For the DASH eating plan, you will follow these general guidelines:  Choose foods with a percent daily value for sodium of less than 5% (as listed on the food label).  Use salt-free seasonings or herbs instead of table salt or sea salt.  Check with your health care provider or pharmacist before using salt substitutes.  Eat lower-sodium products, often labeled as "lower sodium" or "no salt added."  Eat fresh foods.  Eat more vegetables, fruits, and low-fat dairy products.  Choose whole grains. Look for the word "whole" as the first word in the ingredient list.  Choose fish and skinless chicken or [place] more often than red meat. Limit fish, poultry, and meat to 6 oz (170 g) each  day.  Limit sweets, desserts, sugars, and sugary drinks.  Choose heart-healthy fats.  Limit cheese to 1 oz (28 g) per day.  Eat more home-cooked food and less restaurant, buffet, and fast food.  Limit fried foods.  Cook foods using methods other than frying.  Limit canned vegetables. If you do use them, rinse them well to decrease the sodium.  When eating at a restaurant, ask that your food be prepared with less salt, or no salt if possible. WHAT FOODS CAN I EAT? Seek help from a dietitian for individual calorie needs. Grains Whole grain or whole wheat bread. Brown rice. Whole grain or whole wheat pasta. Quinoa, bulgur, and whole grain cereals. Low-sodium cereals. Corn or whole wheat flour tortillas. Whole grain cornbread. Whole grain crackers. Low-sodium crackers. Vegetables Fresh or frozen vegetables (raw, steamed, roasted, or grilled). Low-sodium or reduced-sodium tomato and vegetable juices. Low-sodium or reduced-sodium tomato sauce and paste. Low-sodium or reduced-sodium canned vegetables.  Fruits All fresh, canned (in natural juice), or frozen fruits. Meat and Other Protein Products Ground beef (85% or leaner), grass-fed beef, or beef trimmed of fat. Skinless chicken or [place]. Ground chicken or [place]. Pork trimmed of fat. All fish and seafood. Eggs. Dried beans, peas, or lentils. Unsalted nuts and seeds. Unsalted canned beans. Dairy Low-fat dairy products, such as skim or 1% milk, 2% or reduced-fat cheeses, low-fat ricotta or cottage cheese, or plain low-fat yogurt. Low-sodium or reduced-sodium cheeses. Fats and Oils Tub margarines without trans fats. Light or reduced-fat mayonnaise and salad dressings (reduced sodium). Avocado. Safflower, olive, or canola oils. Natural peanut or almond butter. Other Unsalted popcorn and pretzels. The items listed above may not be a complete list of recommended foods or beverages. Contact your dietitian for more options. WHAT FOODS ARE NOT  RECOMMENDED? Grains White bread. White pasta. White rice. Refined cornbread. Bagels and croissants. Crackers that contain trans fat. Vegetables Creamed or fried vegetables. Vegetables in a cheese sauce. Regular canned vegetables. Regular canned tomato sauce and paste. Regular tomato and vegetable juices. Fruits Dried fruits. Canned  fruit in light or heavy syrup. Fruit juice. Meat and Other Protein Products Fatty cuts of meat. Ribs, chicken wings, bacon, sausage, bologna, salami, chitterlings, fatback, hot dogs, bratwurst, and packaged luncheon meats. Salted nuts and seeds. Canned beans with salt. Dairy Whole or 2% milk, cream, half-and-half, and cream cheese. Whole-fat or sweetened yogurt. Full-fat cheeses or blue cheese. Nondairy creamers and whipped toppings. Processed cheese, cheese spreads, or cheese curds. Condiments Onion and garlic salt, seasoned salt, table salt, and sea salt. Canned and packaged gravies. Worcestershire sauce. Tartar sauce. Barbecue sauce. Teriyaki sauce. Soy sauce, including reduced sodium. Steak sauce. Fish sauce. Oyster sauce. Cocktail sauce. Horseradish. Ketchup and mustard. Meat flavorings and tenderizers. Bouillon cubes. Hot sauce. Tabasco sauce. Marinades. Taco seasonings. Relishes. Fats and Oils Butter, stick margarine, lard, shortening, ghee, and bacon fat. Coconut, palm kernel, or palm oils. Regular salad dressings. Other Pickles and olives. Salted popcorn and pretzels. The items listed above may not be a complete list of foods and beverages to avoid. Contact your dietitian for more information. WHERE CAN I FIND MORE INFORMATION? National Heart, [employer], and [employer] Institute: travelstabloid.com Document Released: 05/15/2011 Document Revised: 10/10/2013 Document Reviewed: 03/30/2013 [facility] Patient Information 2015 [place], [place]. This information is not intended to replace advice given to you by your health care provider. Make  sure you discuss any questions you have with your health care provider.

## 2014-12-15 NOTE — Progress Notes (Signed)
Patient presents for BP check, CBG and record review for T2DM, however, patient did not bring log or meter. States he is not checking BS daily because he doesn't like to; last checked 1 week ago, states BS was 197 Patient given blood sugar log and instructed on use. Instructed to bring to all future visits. Instructed to check BS AM fasting (at least 8 hours after eating or drinking something sweet), prior to dinner and anytime not feeling well States he had 1 episode of sweating but did not check BS. States it had been 7 hours between meals. Ate meal and felt better. States he has never had a BS < 70 S/sx of hypoglycemia and immediate actions to take discussed in detail Patient instructed to eat 3 meals daily with snacks in between. Discussed Plate Method Med list reviewed; patient reports taking all meds as directed. Still taking Janumet. Understands when he gets refill it will be metformin and Januvia due to [facility] requirements Positive for increased thirst and urination Patient states he's always thirsty; On 2 L fluid restriction due to CHF Running/walking 3 miles daily Chronic SHOB. States cardiologist increased lasix 2-3 days ago Wt today at 257.6 lb Denies swelling in legs and hands Denies heart palpitations  CBG 192 5+ hours after meal HgB A1c 10.9  Lab Results  Component Value Date   HGBA1C 13.9* 09/08/2014   Filed Vitals:   12/15/14 1557  BP: 111/74  Pulse: 84  Temp: 97.9 F (36.6 C)  Resp: 20   PCP updated  Patient advised to call for med refills at least 7 days before running out so as not to go without.   Patient aware that he is to f/u with PCP 3 months from last visit. Due 01/20/2015 with BS log  Patient given literature on DASH Eating Plan, Type 2 Diabetes, Diabetes and Food, Diabetes and Exercise, Basic Carb Counting, Diabetes and Foot Care, Diabetic Neuropathy, and Hypoglycemia

## 2014-12-18 ENCOUNTER — Telehealth: Payer: Self-pay | Admitting: Cardiology

## 2014-12-18 NOTE — Telephone Encounter (Signed)
Katrina is calling because she has a patient , where there is an old order int he system and wants to know if they are doing those labs . Please call

## 2014-12-18 NOTE — Telephone Encounter (Signed)
[facility] instructed to complete the labs requested ( Katrina )

## 2014-12-19 ENCOUNTER — Other Ambulatory Visit: Payer: Self-pay | Admitting: *Deleted

## 2014-12-19 ENCOUNTER — Telehealth: Payer: Self-pay | Admitting: *Deleted

## 2014-12-19 DIAGNOSIS — Z79899 Other long term (current) drug therapy: Secondary | ICD-10-CM

## 2014-12-19 LAB — BASIC METABOLIC PANEL WITH GFR
BUN: 13 mg/dL (ref 6–23)
CHLORIDE: 97 meq/L (ref 96–112)
CO2: 29 mEq/L (ref 19–32)
Calcium: 9.3 mg/dL (ref 8.4–10.5)
Creat: 0.84 mg/dL (ref 0.50–1.35)
GFR, Est African American: >89 mL/min
Glucose, Bld: 195 mg/dL — ABNORMAL HIGH (ref 70–99)
POTASSIUM: 3.8 meq/L (ref 3.5–5.3)
Sodium: 139 mEq/L (ref 135–145)

## 2014-12-19 NOTE — Telephone Encounter (Signed)
Patient instructed on med changes per [name] and voiced understanding. He will have repeat lab work 7/20 or 7/21  He inquired about eating 2 dill pickle spears daily - per RN research, on average they contain about 300mg  sodium each. Advised patient to cut back on this along with frozen meals, which he reports to eat as well.

## 2014-12-19 NOTE — Telephone Encounter (Signed)
-----   Message from Lelon Perla, MD sent at 12/19/2014  2:36 PM EDT ----- Increase lasix to 80 BID; fu bmet one week [name]  ----- Message -----    From: Fidel Levy, RN    Sent: 12/19/2014   1:57 PM      To: Lelon Perla, MD  Spoke with patient and communicated results.   He reports 8lb weight gain in 2 days and is short of breath.  Per triage protocol, advised that he take an extra lasix this afternoon (to make 2 tabs QAM and 2 tabs QPM 7/12) and that I would send message to MD to advise further. He voiced understanding.

## 2014-12-22 ENCOUNTER — Telehealth: Payer: Self-pay | Admitting: Family Medicine

## 2014-12-22 NOTE — Telephone Encounter (Signed)
Patient called to request a med refill for all of his current medications, please f/u with pt.

## 2014-12-25 ENCOUNTER — Telehealth: Payer: Self-pay | Admitting: Cardiology

## 2014-12-25 ENCOUNTER — Ambulatory Visit (INDEPENDENT_AMBULATORY_CARE_PROVIDER_SITE_OTHER): Payer: Medicaid Other | Admitting: *Deleted

## 2014-12-25 DIAGNOSIS — Z95810 Presence of automatic (implantable) cardiac defibrillator: Secondary | ICD-10-CM | POA: Diagnosis not present

## 2014-12-25 DIAGNOSIS — I5022 Chronic systolic (congestive) heart failure: Secondary | ICD-10-CM

## 2014-12-25 MED ORDER — SITAGLIPTIN PHOSPHATE 100 MG PO TABS: 100.0000 mg | ORAL_TABLET | Freq: Every day | ORAL | Status: DC

## 2014-12-25 MED ORDER — METFORMIN HCL 1000 MG PO TABS: 1000.0000 mg | ORAL_TABLET | Freq: Two times a day (BID) | ORAL | Status: DC

## 2014-12-25 NOTE — Telephone Encounter (Signed)
Rx refills send to [facility]  Pt aware

## 2014-12-25 NOTE — Telephone Encounter (Signed)
LMOVM reminding pt to send remote transmission.   

## 2014-12-27 LAB — BASIC METABOLIC PANEL
BUN: 15 mg/dL (ref 6–23)
CO2: 30 meq/L (ref 19–32)
Calcium: 9.8 mg/dL (ref 8.4–10.5)
Chloride: 95 mEq/L — ABNORMAL LOW (ref 96–112)
Creat: 0.89 mg/dL (ref 0.50–1.35)
GLUCOSE: 209 mg/dL — AB (ref 70–99)
POTASSIUM: 4.1 meq/L (ref 3.5–5.3)
Sodium: 139 mEq/L (ref 135–145)

## 2014-12-28 ENCOUNTER — Encounter: Payer: Self-pay | Admitting: *Deleted

## 2014-12-28 NOTE — Progress Notes (Signed)
EPIC Encounter for ICM Monitoring  Patient Name: [name] is a 49 y.o. male Date: 12/28/2014 Primary Care Physican: Minerva Ends, MD Primary Cardiologist: Stanford Breed Electrophysiologist: Allred Dry Weight: 250 lbs       In the past month, have you:  1. Gained more than 2 pounds in a day or more than 5 pounds in a week? Yes. The patient called Cr. Crenshaw's office on 12/19/14 with complaints of a 8 lb weigh gain in 2 days. He had previously told me that his baseline weight was 260 lbs, but today tells me he is 250 lbs at baseline and that this is what he has been running.  2. Had changes in your medications (with verification of current medications)? Yes. The patient was previously on lasix 40 mg BID, on 12/12/14 this was increased to 80 mg in the AM and 40 mg in the PM. When he called the office on 7/12- this was increased to lasix 80 mg BID per [name].  3. Had more shortness of breath than is usual for you? Yes. The patient states he is running 3-4 miles a day (around 8:30 am in the mornings), but recently he has had increased SOB and some intermittent chest pain. He also states he is having abdominal swelling.   4. Limited your activity because of shortness of breath? Somewhat.  5. Not been able to sleep because of shortness of breath? no  6. Had increased swelling in your feet or ankles? no  7. Had symptoms of dehydration (dizziness, dry mouth, increased thirst, decreased urine output) no  8. Had changes in sodium restriction? no  9. Been compliant with medication? Yes   ICM trend:   Follow-up plan: ICM clinic phone appointment: 01/29/15. The patient's corvue readings were last elevated from ~ 7/10-7/17. He did call the office on 7/12 with complaints of an 8 lb weight gain. He is frustrated that he cannot lose weight, but he tells me today he is around 250 lbs. Per EPIC, he was 270 lbs on 10/17/14. He states he has tried to get in with [name] to discuss his  symptoms, but he has no openings. I advised the patient [name] had cancellation for 12/29/14 at 4:15 pm. He did want to be seen by [name] tomorrow, therefore follow up has been scheduled for 12/29/14. The patient is aware of the location and arrival time for his appointment.   Copy of note sent to patient's primary care physician, primary cardiologist, and device following physician.  Alvis Lemmings, RN, BSN 12/28/2014 10:27 AM

## 2014-12-29 ENCOUNTER — Encounter: Payer: Self-pay | Admitting: Cardiology

## 2014-12-29 ENCOUNTER — Ambulatory Visit (INDEPENDENT_AMBULATORY_CARE_PROVIDER_SITE_OTHER): Payer: Medicaid Other | Admitting: Cardiology

## 2014-12-29 DIAGNOSIS — I429 Cardiomyopathy, unspecified: Secondary | ICD-10-CM

## 2014-12-29 DIAGNOSIS — I519 Heart disease, unspecified: Secondary | ICD-10-CM

## 2014-12-29 DIAGNOSIS — I428 Other cardiomyopathies: Secondary | ICD-10-CM

## 2014-12-29 MED ORDER — CARVEDILOL 25 MG PO TABS: 25.0000 mg | ORAL_TABLET | Freq: Two times a day (BID) | ORAL | Status: DC

## 2014-12-29 NOTE — Assessment & Plan Note (Signed)
Some of symptoms seem out of proportion to findings on physical. Continue present dose of Lasix. Continue spironolactone. I have asked him to weigh himself daily. I also discussed low-sodium diet. In the future if he continues to have significant symptoms we will consider referral to the CHF clinic and he may benefit from a right heart catheterization.

## 2014-12-29 NOTE — Patient Instructions (Signed)
Your physician recommends that you schedule a follow-up appointment in: AS SCHEDULED  INCREASE CARVEDILOL TO 25 MG TWICE DAILY= 2 OF THE 12.5 MG TABLETS TWICE DAILY

## 2014-12-29 NOTE — Assessment & Plan Note (Signed)
Continue ARB. Increase carvedilol to 25 mg by mouth twice a day.

## 2014-12-29 NOTE — Progress Notes (Signed)
HPI: FU CM/CHF. Echo 2/15 revealed EF of 20-25% with diffuse hypokinesis and biatrial enlargement, mild MR and a small pericardial effusion. TSH in February 2015 normal. Cardiac catheterization in March of 2015 showed normal coronary arteries. Echocardiogram repeated in June 2016 and showed ejection fraction 25-30%, moderate left ventricular hypertrophy, grade 1 diastolic dysfunction and moderate left atrial enlargement. BNP in June 2016-36. Patient recently complaining of increased weight and dyspnea. Lasix increased. Since last seen, he states he has dyspnea approximately 80% of the time. However there is no relation to position and he states he can run without being short of breath. No pedal edema. He continues with occasional chest pain. No syncope.  Current Outpatient Prescriptions  Medication Sig Dispense Refill  . ACCU-CHEK SOFTCLIX LANCETS lancets 1 each by Other route 3 (three) times daily. 100 each 12  . aspirin 81 MG tablet Take 81 mg by mouth daily.    . Blood Glucose Monitoring Suppl (ACCU-CHEK AVIVA PLUS) W/DEVICE KIT 1 Device by Does not apply route 3 (three) times daily after meals. 1 kit 0  . carvedilol (COREG) 12.5 MG tablet Take 1.5 tablets (18.75 mg total) by mouth 2 (two) times daily. 90 tablet 11  . furosemide (LASIX) 20 MG tablet Take 20 mg by mouth 2 (two) times daily.    Marland Kitchen glucose blood (ACCU-CHEK AVIVA PLUS) test strip 1 each by Other route 3 (three) times daily. 100 each 12  . Lancet Devices (ACCU-CHEK SOFTCLIX) lancets 1 each by Other route 3 (three) times daily. 1 each 0  . losartan (COZAAR) 50 MG tablet Take 1 tablet (50 mg total) by mouth daily. 90 tablet 3  . metFORMIN (GLUCOPHAGE) 1000 MG tablet Take 1 tablet (1,000 mg total) by mouth 2 (two) times daily with a meal. 180 tablet 3  . potassium chloride SA (K-DUR,KLOR-CON) 20 MEQ tablet Take 1 tablet (20 mEq total) by mouth daily. 90 tablet 3  . sitaGLIPtin (JANUVIA) 100 MG tablet Take 1 tablet (100 mg total) by  mouth daily. 90 tablet 3  . spironolactone (ALDACTONE) 25 MG tablet Take 1 tablet (25 mg total) by mouth daily. 90 tablet 3   No current facility-administered medications for this visit.     Past Medical History  Diagnosis Date  . Obesity   . 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 History of renal calculi   . Cardiomyopathy     a. 07/2013 Echo: EF 20-25%, diff HK, Gr2 DD, mild MR, mod dil LA/RA.  Marland Kitchen Fracture of left humerus     a. 07/2013.  Marland Kitchen Chronic bronchitis     "get it most q yr" (12/23/2013)  . Kidney stones   . GERD (gastroesophageal reflux disease)     not at present time  . Heart murmur     "born w/it"   . Obesity   . Chronic systolic dysfunction of left ventricle   . Syncope   . Hypertension   . Type II diabetes mellitus 2006  . CHF (congestive heart failure)     Past Surgical History  Procedure Laterality Date  . Cholecystectomy  ~ 2010  . Cardiac catheterization  08/10/13  . Ureteroscopy      "laser for kidney stones"  . Orif humerus fracture Left 08/12/2013    Procedure: OPEN REDUCTION INTERNAL FIXATION (ORIF) HUMERAL SHAFT FRACTURE;  Surgeon: Newt Minion, MD;  Location: [facility];  Service: Orthopedics;  Laterality: Left;  Open Reduction  Internal Fixation Left Humerus  . Implantable cardioverter defibrillator implant  12/23/2013    STJ Fortify ICD implanted by [name] for cardiomyopathy and syncope  . Left heart catheterization with coronary angiogram N/A 08/10/2013    Procedure: LEFT HEART CATHETERIZATION WITH CORONARY ANGIOGRAM;  Surgeon: Jettie Booze, MD;  Location: [facility] C[facility] LAB;  Service: Cardiovascular;  Laterality: N/A;  . Implantable cardioverter defibrillator implant N/A 12/23/2013    Procedure: IMPLANTABLE CARDIOVERTER DEFIBRILLATOR IMPLANT;  Surgeon: Coralyn Mark, MD;  Location: [facility] CATH LAB;  Service: Cardiovascular;  Laterality: N/A;    History   Social History  . Marital Status: Single    Spouse  Name: N/A  . Number of Children: N/A  . Years of Education: N/A   Occupational History  . Not on file.   Social History Main Topics  . Smoking status: Former Smoker -- 1.00 packs/day for 28 years    Types: Cigarettes    Quit date: 06/03/2014  . Smokeless tobacco: Never Used  . Alcohol Use: 0.0 oz/week    0 Standard drinks or equivalent per week     Comment: 12/23/2013 "might drink a couple times/yr"  . Drug Use: Yes    Special: Marijuana  . Sexual Activity: Yes   Other Topics Concern  . Not on file   Social History Narrative    ROS: no fevers or chills, productive cough, hemoptysis, dysphasia, odynophagia, melena, hematochezia, dysuria, hematuria, rash, seizure activity, orthopnea, PND, pedal edema, claudication. Remaining systems are negative.  Physical Exam: Well-developed well-nourished in no acute distress.  Skin is warm and dry.  HEENT is normal.  Neck is supple.  Chest is clear to auscultation with normal expansion.  Cardiovascular exam is regular rate and rhythm.  Abdominal exam nontender or distended. No masses palpated. Extremities show no edema. neuro grossly intact

## 2014-12-29 NOTE — Assessment & Plan Note (Signed)
Previous catheterization showed no coronary disease. Symptoms atypical. No further evaluation.

## 2014-12-29 NOTE — Assessment & Plan Note (Signed)
Followed by electrophysiology. 

## 2015-01-02 ENCOUNTER — Emergency Department (HOSPITAL_COMMUNITY)
Admission: EM | Admit: 2015-01-02 | Discharge: 2015-01-02 | Disposition: A | Discharge status: Home / Self Care | Payer: Medicaid Other | Source: Home / Self Care | Attending: Emergency Medicine | Admitting: Emergency Medicine

## 2015-01-02 ENCOUNTER — Encounter (HOSPITAL_COMMUNITY): Payer: Self-pay | Admitting: Emergency Medicine

## 2015-01-02 DIAGNOSIS — S99921A Unspecified injury of right foot, initial encounter: Secondary | ICD-10-CM

## 2015-01-02 NOTE — Discharge Instructions (Signed)
You have a scab where the pedicurist cut you. There is no sign of infection. Do warm Epsom salt soaks 3 times a day for the next few days. If you see any redness or pus, please see your primary care doctor or follow-up here.

## 2015-01-02 NOTE — ED Provider Notes (Signed)
CSN: [id number]     Arrival date & time 01/02/15  1529 History   First MD Initiated Contact with Patient 01/02/15 1703     Chief Complaint  Patient presents with  . Nail Problem   (Consider location/radiation/quality/duration/timing/severity/associated sxs/prior Treatment) HPI  He is a 49 year old man here for evaluation of right second toe injury. He states he had a pedicure a week or 2 ago and they clipped the skin of his right second toe. He has noticed a dark spot at that location. He wanted to get it checked out as he is diabetic. He denies any drainage or redness. He does state it is a little sore and there is a little bit of swelling.  Past Medical History  Diagnosis Date  . Obesity   . 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 History of renal calculi   . Cardiomyopathy     a. 07/2013 Echo: EF 20-25%, diff HK, Gr2 DD, mild MR, mod dil LA/RA.  Marland Kitchen Fracture of left humerus     a. 07/2013.  Marland Kitchen Chronic bronchitis     "get it most q yr" (12/23/2013)  . Kidney stones   . GERD (gastroesophageal reflux disease)     not at present time  . Heart murmur     "born w/it"   . Obesity   . Chronic systolic dysfunction of left ventricle   . Syncope   . Hypertension   . Type II diabetes mellitus 2006  . CHF (congestive heart failure)    Past Surgical History  Procedure Laterality Date  . Cholecystectomy  ~ 2010  . Cardiac catheterization  08/10/13  . Ureteroscopy      "laser for kidney stones"  . Orif humerus fracture Left 08/12/2013    Procedure: OPEN REDUCTION INTERNAL FIXATION (ORIF) HUMERAL SHAFT FRACTURE;  Surgeon: Newt Minion, MD;  Location: [place];  Service: Orthopedics;  Laterality: Left;  Open Reduction Internal Fixation Left Humerus  . Implantable cardioverter defibrillator implant  12/23/2013    STJ Fortify ICD implanted by [name] for cardiomyopathy and syncope  . Left heart catheterization with coronary angiogram N/A 08/10/2013   Procedure: LEFT HEART CATHETERIZATION WITH CORONARY ANGIOGRAM;  Surgeon: Jettie Booze, MD;  Location: [facility] CATH LAB;  Service: Cardiovascular;  Laterality: N/A;  . Implantable cardioverter defibrillator implant N/A 12/23/2013    Procedure: IMPLANTABLE CARDIOVERTER DEFIBRILLATOR IMPLANT;  Surgeon: Coralyn Mark, MD;  Location: [facility] CATH LAB;  Service: Cardiovascular;  Laterality: N/A;   Family History  Problem Relation Age of Onset  . Diabetes Father   . Arthritis Father   . Diabetes Mother   . Arthritis Mother   . Hypertension Brother    History  Substance Use Topics  . Smoking status: Former Smoker -- 1.00 packs/day for 28 years    Types: Cigarettes    Quit date: 06/03/2014  . Smokeless tobacco: Never Used  . Alcohol Use: 0.0 oz/week    0 Standard drinks or equivalent per week     Comment: 12/23/2013 "might drink a couple times/yr"    Review of Systems As in history of present illness Allergies  Bee venom  Home Medications   Prior to Admission medications   Medication Sig Start Date End Date Taking? Authorizing Provider  ACCU-CHEK SOFTCLIX LANCETS lancets 1 each by Other route 3 (three) times daily. 10/20/14  Yes Boykin Nearing, MD  aspirin 81 MG tablet Take 81 mg by mouth daily.   Yes  Historical Provider, MD  Blood Glucose Monitoring Suppl (ACCU-CHEK AVIVA PLUS) W/DEVICE KIT 1 Device by Does not apply route 3 (three) times daily after meals. 10/20/14  Yes Josalyn Funches, MD  carvedilol (COREG) 25 MG tablet Take 1 tablet (25 mg total) by mouth 2 (two) times daily. 12/29/14  Yes Lelon Perla, MD  furosemide (LASIX) 20 MG tablet Take 20 mg by mouth 2 (two) times daily.   Yes Historical Provider, MD  glucose blood (ACCU-CHEK AVIVA PLUS) test strip 1 each by Other route 3 (three) times daily. 10/20/14  Yes Boykin Nearing, MD  Lancet Devices [facility]) lancets 1 each by Other route 3 (three) times daily. 10/20/14  Yes Josalyn Funches, MD  losartan (COZAAR) 50 MG  tablet Take 1 tablet (50 mg total) by mouth daily. 11/20/14  Yes Lelon Perla, MD  metFORMIN (GLUCOPHAGE) 1000 MG tablet Take 1 tablet (1,000 mg total) by mouth 2 (two) times daily with a meal. 12/25/14  Yes Josalyn Funches, MD  potassium chloride SA (K-DUR,KLOR-CON) 20 MEQ tablet Take 1 tablet (20 mEq total) by mouth daily. 11/20/14  Yes Lelon Perla, MD  sitaGLIPtin (JANUVIA) 100 MG tablet Take 1 tablet (100 mg total) by mouth daily. 12/25/14  Yes Josalyn Funches, MD  spironolactone (ALDACTONE) 25 MG tablet Take 1 tablet (25 mg total) by mouth daily. 12/22/13  Yes Lelon Perla, MD   BP 112/75 mmHg  Pulse 80  Temp(Src) 98.1 F (36.7 C) (Oral)  Resp 82  SpO2 96% Physical Exam  Constitutional: He is oriented to person, place, and time. He appears well-developed and well-nourished. No distress.  Cardiovascular: Normal rate.   Pulmonary/Chest: Effort normal.  Neurological: He is alert and oriented to person, place, and time.  Skin:  Right second toe has a scab at the medial nail edge. There is no redness or warmth. I am unable to express any pus.    ED Course  Procedures (including critical care time) Labs Review Labs Reviewed - No data to display  Imaging Review No results found.   MDM   1. Toe injury, right, initial encounter    No signs of infection at this time. Recommended warm Epsom salts soaks 3 times a day for the next several days. Reviewed signs of infection that would warrant follow-up with PCP or here.    Melony Overly, MD 01/02/15 1725

## 2015-01-02 NOTE — ED Notes (Addendum)
Pt states he got a pedicure about a week and a half ago and they cut his 2nd toe on his right foot.  Pt states he is concerned for infection because he has had some pain and he says it has turned black.  He denies any fevers and the pain is limited to just the toe.

## 2015-01-08 ENCOUNTER — Ambulatory Visit: Payer: Medicaid Other | Attending: Family Medicine | Admitting: Family Medicine

## 2015-01-08 ENCOUNTER — Encounter: Payer: Self-pay | Admitting: Family Medicine

## 2015-01-08 VITALS — BP 122/79 | HR 77 | Temp 98.4°F | Resp 16 | Ht 73.0 in | Wt 263.0 lb

## 2015-01-08 DIAGNOSIS — E1165 Type 2 diabetes mellitus with hyperglycemia: Secondary | ICD-10-CM

## 2015-01-08 DIAGNOSIS — E119 Type 2 diabetes mellitus without complications: Secondary | ICD-10-CM | POA: Diagnosis not present

## 2015-01-08 DIAGNOSIS — Z87891 Personal history of nicotine dependence: Secondary | ICD-10-CM | POA: Diagnosis not present

## 2015-01-08 DIAGNOSIS — R1909 Other intra-abdominal and pelvic swelling, mass and lump: Secondary | ICD-10-CM | POA: Insufficient documentation

## 2015-01-08 DIAGNOSIS — IMO0002 Reserved for concepts with insufficient information to code with codable children: Secondary | ICD-10-CM

## 2015-01-08 DIAGNOSIS — E669 Obesity, unspecified: Secondary | ICD-10-CM | POA: Diagnosis not present

## 2015-01-08 LAB — GLUCOSE, POCT (MANUAL RESULT ENTRY): POC Glucose: 241 mg/dl — AB (ref 70–99)

## 2015-01-08 NOTE — Progress Notes (Signed)
   Subjective:    Patient ID: [name], male    DOB: March 16, 1966, 49 y.o.   MRN: [id number] CC: requesting abdominal x-ray for swelling on R side  HPI 49 yo M with history of diabetes presents for f/u appt:  1. R abdominal obesity: x 3 months. No pain. Looks big. No ETOH use. No constipation. Has never had a colonoscopy. Declines colonoscopy as his mother had colon cancer and patient does not want to deal with any other possible medical problems.   History  Substance Use Topics  . Smoking status: Former Smoker -- 1.00 packs/day for 28 years    Types: Cigarettes    Quit date: 06/03/2014  . Smokeless tobacco: Never Used  . Alcohol Use: 0.0 oz/week    0 Standard drinks or equivalent per week     Comment: 12/23/2013 "might drink a couple times/yr"   Review of Systems  Constitutional: Negative for fever and diaphoresis.  Gastrointestinal: Positive for abdominal distention. Negative for nausea, vomiting, abdominal pain, diarrhea, constipation, blood in stool, anal bleeding and rectal pain.      Objective:   Physical Exam BP 122/79 mmHg  Pulse 77  Temp(Src) 98.4 F (36.9 C) (Oral)  Resp 16  Ht 6\' 1"  (1.854 m)  Wt 263 lb (119.296 kg)  BMI 34.71 kg/m2  SpO2 96%  Wt Readings from Last 3 Encounters:  01/08/15 263 lb (119.296 kg)  12/29/14 257 lb (116.574 kg)  12/15/14 257 lb 9.6 oz (116.847 kg)  General appearance: alert, cooperative, no distress and moderately obese Abdomen: soft, non-tender; bowel sounds normal; no masses,  no organomegaly       Assessment & Plan:

## 2015-01-08 NOTE — Patient Instructions (Addendum)
[name],  Thank you for coming in today  1. Abdominal fat: Normal exam today I know that is challenging to get rib of belly fat but not impossible Start adding in up body weights and back exercises to tone your core. Keep in mind that the more muscle you have, the more fat you will burn  I have placed a podiatry referral so you can get your nails clipped there  F/u in 3 months for diabetes   Dr. Adrian [place]

## 2015-01-08 NOTE — Progress Notes (Signed)
F/U DM Abdominal swelling no pain worse on rt side  No Hx constipation

## 2015-01-08 NOTE — Assessment & Plan Note (Signed)
Abdominal fat: Normal exam today I know that is challenging to get rib of belly fat but not impossible Start adding in up body weights and back exercises to tone your core. Keep in mind that the more muscle you have, the more fat you will burn  I have placed a podiatry referral so you can get your nails clipped there

## 2015-01-18 ENCOUNTER — Ambulatory Visit (INDEPENDENT_AMBULATORY_CARE_PROVIDER_SITE_OTHER): Payer: Medicaid Other | Admitting: Podiatry

## 2015-01-18 ENCOUNTER — Encounter: Payer: Self-pay | Admitting: Podiatry

## 2015-01-18 VITALS — BP 151/82 | HR 83 | Resp 16 | Ht 72.0 in | Wt 250.0 lb

## 2015-01-18 DIAGNOSIS — L309 Dermatitis, unspecified: Secondary | ICD-10-CM

## 2015-01-18 NOTE — Progress Notes (Signed)
Subjective:     Patient ID: [name], male   DOB: 13-Dec-1965, 49 y.o.   MRN: [id number]  HPI patient presents with discoloration of nailbeds and also concern of discoloration between toes   Review of Systems  All other systems reviewed and are negative.      Objective:   Physical Exam  Constitutional: He is oriented to person, place, and time.  Cardiovascular: Intact distal pulses.   Musculoskeletal: Normal range of motion.  Neurological: He is oriented to person, place, and time.  Skin: Skin is warm and dry.  Nursing note and vitals reviewed.  neurovascular status found to be intact with muscle strength adequate range of motion within normal limits. Patient's found to have good digital perfusion is well oriented 3 and has discoloration around the edges the hallux nail bilateral and small amount of discoloration between the fourth and fifth digits of both feet     Assessment:     Probable dermatitis-like reaction with possible mycosis but no indications of necrosis or any type of circulatory deficit    Plan:     H&P performed condition reviewed with patient and do not recommend treatment and less were to get worse. Explained what to do at the current time

## 2015-01-18 NOTE — Progress Notes (Signed)
   Subjective:    Patient ID: [name], male    DOB: 06/04/1966, 49 y.o.   MRN: [id number]  HPI Patient presents with bilateral nail problem. On Right foot, 2nd toe, pt stated, "nail looked black and was painful" was due to a pedicare 3 weeks ago. On Left and right foot, 4th toe, lateral side, looks black. Pt noticed last week.   Review of Systems  Respiratory: Positive for shortness of breath.   Cardiovascular: Positive for chest pain.  Endocrine: Positive for polyuria.  Genitourinary: Positive for frequency.  Neurological: Positive for dizziness and light-headedness.  All other systems reviewed and are negative.      Objective:   Physical Exam        Assessment & Plan:

## 2015-01-19 ENCOUNTER — Ambulatory Visit: Payer: Medicaid Other | Admitting: Family Medicine

## 2015-01-21 ENCOUNTER — Encounter (HOSPITAL_COMMUNITY): Payer: Self-pay | Admitting: Emergency Medicine

## 2015-01-21 ENCOUNTER — Emergency Department (HOSPITAL_COMMUNITY)
Admission: EM | Admit: 2015-01-21 | Discharge: 2015-01-21 | Disposition: A | Discharge status: Home / Self Care | Payer: Medicaid Other | Source: Home / Self Care | Attending: Family Medicine | Admitting: Family Medicine

## 2015-01-21 DIAGNOSIS — J020 Streptococcal pharyngitis: Secondary | ICD-10-CM

## 2015-01-21 MED ORDER — CEFDINIR 300 MG PO CAPS: 300.0000 mg | ORAL_CAPSULE | Freq: Two times a day (BID) | ORAL | Status: DC

## 2015-01-21 NOTE — ED Notes (Signed)
Sore throat that started yesterday

## 2015-01-21 NOTE — Discharge Instructions (Signed)
Drink lots of fluids, take all of medicine, use lozenges as needed.return if needed °

## 2015-01-21 NOTE — ED Provider Notes (Signed)
CSN: [id number]     Arrival date & time 01/21/15  1645 History   First MD Initiated Contact with Patient 01/21/15 1654     Chief Complaint  Patient presents with  . Sore Throat   (Consider location/radiation/quality/duration/timing/severity/associated sxs/prior Treatment) Patient is a 49 y.o. male presenting with pharyngitis. The history is provided by the patient.  Sore Throat This is a new problem. The current episode started yesterday. The problem has been gradually worsening. The symptoms are aggravated by swallowing.    Past Medical History  Diagnosis Date  . Obesity   . 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 History of renal calculi   . Cardiomyopathy     a. 07/2013 Echo: EF 20-25%, diff HK, Gr2 DD, mild MR, mod dil LA/RA.  Marland Kitchen Fracture of left humerus     a. 07/2013.  Marland Kitchen Chronic bronchitis     "get it most q yr" (12/23/2013)  . Kidney stones   . GERD (gastroesophageal reflux disease)     not at present time  . Heart murmur     "born w/it"   . Obesity   . Chronic systolic dysfunction of left ventricle   . Syncope   . Hypertension   . Type II diabetes mellitus 2006  . CHF (congestive heart failure)   . 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 Cholecystectomy  ~ 2010  . Cardiac catheterization  08/10/13  . Ureteroscopy      "laser for kidney stones"  . Orif humerus fracture Left 08/12/2013    Procedure: OPEN REDUCTION INTERNAL FIXATION (ORIF) HUMERAL SHAFT FRACTURE;  Surgeon: Newt Minion, MD;  Location: [place];  Service: Orthopedics;  Laterality: Left;  Open Reduction Internal Fixation Left Humerus  . Implantable cardioverter defibrillator implant  12/23/2013    STJ Fortify ICD implanted by [name] for cardiomyopathy and syncope  . Left heart catheterization with coronary angiogram N/A 08/10/2013    Procedure: LEFT HEART CATHETERIZATION WITH  CORONARY ANGIOGRAM;  Surgeon: Jettie Booze, MD;  Location: [facility] CATH LAB;  Service: Cardiovascular;  Laterality: N/A;  . Implantable cardioverter defibrillator implant N/A 12/23/2013    Procedure: IMPLANTABLE CARDIOVERTER DEFIBRILLATOR IMPLANT;  Surgeon: Coralyn Mark, MD;  Location: [facility] CATH LAB;  Service: Cardiovascular;  Laterality: N/A;   Family History  Problem Relation Age of Onset  . Diabetes Father   . Arthritis Father   . Diabetes Mother   . Arthritis Mother   . Hypertension Brother    Social History  Substance Use Topics  . Smoking status: Former Smoker -- 1.00 packs/day for 28 years    Types: Cigarettes    Quit date: 06/03/2014  . Smokeless tobacco: Never Used  . Alcohol Use: 0.0 oz/week    0 Standard drinks or equivalent per week     Comment: 12/23/2013 "might drink a couple times/yr"    Review of Systems  Constitutional: Positive for fever and chills.  HENT: Positive for sore throat. Negative for rhinorrhea.   Respiratory: Negative for cough.   Gastrointestinal: Negative.   Musculoskeletal: Positive for myalgias.  Hematological: Positive for adenopathy.    Allergies  Bee venom  Home Medications   Prior to Admission medications   Medication Sig Start Date End Date Taking? Authorizing Provider  ACCU-CHEK SOFTCLIX LANCETS lancets 1 each by Other route 3 (three) times daily. 10/20/14   Boykin Nearing, MD  aspirin 81 MG tablet Take 81 mg by mouth daily.    Historical Provider, MD  Blood Glucose Monitoring Suppl (ACCU-CHEK AVIVA PLUS) W/DEVICE KIT 1 Device by Does not apply route 3 (three) times daily after meals. 10/20/14   Josalyn Funches, MD  carvedilol (COREG) 25 MG tablet Take 1 tablet (25 mg total) by mouth 2 (two) times daily. 12/29/14   Lelon Perla, MD  cefdinir (OMNICEF) 300 MG capsule Take 1 capsule (300 mg total) by mouth 2 (two) times daily. 01/21/15   Billy Fischer, MD  furosemide (LASIX) 20 MG tablet Take 20 mg by mouth 2 (two) times daily.     Historical Provider, MD  glucose blood (ACCU-CHEK AVIVA PLUS) test strip 1 each by Other route 3 (three) times daily. 10/20/14   Boykin Nearing, MD  Lancet Devices [facility]) lancets 1 each by Other route 3 (three) times daily. 10/20/14   Josalyn Funches, MD  losartan (COZAAR) 50 MG tablet Take 1 tablet (50 mg total) by mouth daily. 11/20/14   Lelon Perla, MD  metFORMIN (GLUCOPHAGE) 1000 MG tablet Take 1 tablet (1,000 mg total) by mouth 2 (two) times daily with a meal. 12/25/14   Josalyn Funches, MD  potassium chloride SA (K-DUR,KLOR-CON) 20 MEQ tablet Take 1 tablet (20 mEq total) by mouth daily. 11/20/14   Lelon Perla, MD  sitaGLIPtin (JANUVIA) 100 MG tablet Take 1 tablet (100 mg total) by mouth daily. 12/25/14   Boykin Nearing, MD  spironolactone (ALDACTONE) 25 MG tablet Take 1 tablet (25 mg total) by mouth daily. 12/22/13   Lelon Perla, MD   BP 107/68 mmHg  Pulse 81  Temp(Src) 99.4 F (37.4 C) (Oral)  Resp 16  SpO2 96% Physical Exam  Constitutional: He is oriented to person, place, and time. He appears well-developed and well-nourished. He appears distressed.  HENT:  Right Ear: External ear normal.  Left Ear: External ear normal.  Mouth/Throat: Uvula is midline and mucous membranes are normal. Oropharyngeal exudate and posterior oropharyngeal erythema present.  Neck: Normal range of motion. Neck supple.  Cardiovascular: Normal heart sounds.   Pulmonary/Chest: Breath sounds normal.  Lymphadenopathy:    He has cervical adenopathy.  Neurological: He is alert and oriented to person, place, and time.  Skin: Skin is warm and dry.  Nursing note and vitals reviewed.   ED Course  Procedures (including critical care time) Labs Review Labs Reviewed - No data to display  Imaging Review No results found.   MDM   1. Streptococcal sore throat        Billy Fischer, MD 01/21/15 2011

## 2015-01-29 ENCOUNTER — Ambulatory Visit (INDEPENDENT_AMBULATORY_CARE_PROVIDER_SITE_OTHER): Payer: Medicaid Other | Admitting: *Deleted

## 2015-01-29 ENCOUNTER — Telehealth: Payer: Self-pay | Admitting: Cardiology

## 2015-01-29 DIAGNOSIS — I5022 Chronic systolic (congestive) heart failure: Secondary | ICD-10-CM

## 2015-01-29 DIAGNOSIS — Z95810 Presence of automatic (implantable) cardiac defibrillator: Secondary | ICD-10-CM

## 2015-01-29 NOTE — Telephone Encounter (Signed)
Spoke with pt and reminded pt of remote transmission that is due today. Pt verbalized understanding.   

## 2015-01-30 NOTE — Progress Notes (Signed)
EPIC Encounter for ICM Monitoring  Patient Name: [name] is a 49 y.o. male Date: 01/30/2015 Primary Care Physican: Minerva Ends, MD Primary Cardiologist: Stanford Breed Electrophysiologist: Allred Dry Weight: 254-255 lbs (weighed last week)       In the past month, have you:  1. Gained more than 2 pounds in a day or more than 5 pounds in a week? Yes, he gained 4-5 pounds last week and has not weighed since then.  Recommended he weigh daily to monitor fluid retention and report call office for if weight has not returned back to baseline or increases.  2. Had changes in your medications (with verification of current medications)? no  3. Had more shortness of breath than is usual for you? no  4. Limited your activity because of shortness of breath? no  5. Not been able to sleep because of shortness of breath? no  6. Had increased swelling in your feet or ankles? no  7. Had symptoms of dehydration (dizziness, dry mouth, increased thirst, decreased urine output) no  8. Had changes in sodium restriction? no  9. Been compliant with medication? Yes   ICM trend:   Follow-up plan: ICM clinic phone appointment 02/13/2015 for repeat transmission.  Corvue readings revealed impedance below baseline 01/21/2015 to 01/28/2015 and he reported he has been sick with strep throat and increased his fluids.  He is feeling better but always has some shortness of breath.  Education given to balance intake of fluids and report any worsening of shortness of breath or leg/feet/ankle swelling.  He is starting to trend back toward baseline.  Requested a manual repeat transmission to be sent in 2 weeks.   Copy of note sent to patient's primary care physician, primary cardiologist, and device following physician.  [name], RN, CCM 01/30/2015 3:43 PM

## 2015-02-01 NOTE — Progress Notes (Signed)
HPI: FU CM/CHF. Echo 2/15 revealed EF of 20-25% with diffuse hypokinesis and biatrial enlargement, mild MR and a small pericardial effusion. TSH in February 2015 normal. Cardiac catheterization in March of 2015 showed normal coronary arteries. Echocardiogram repeated in June 2016 and showed ejection fraction 25-30%, moderate left ventricular hypertrophy, grade 1 diastolic dysfunction and moderate left atrial enlargement. BNP in June 2016-36. Since last seen, he notes some dyspnea unchanged. No orthopnea or PND. No pedal edema. Occasional brief chest pain not sustained.  Current Outpatient Prescriptions  Medication Sig Dispense Refill  . ACCU-CHEK SOFTCLIX LANCETS lancets 1 each by Other route 3 (three) times daily. 100 each 12  . aspirin 81 MG tablet Take 81 mg by mouth daily.    . Blood Glucose Monitoring Suppl (ACCU-CHEK AVIVA PLUS) W/DEVICE KIT 1 Device by Does not apply route 3 (three) times daily after meals. 1 kit 0  . carvedilol (COREG) 25 MG tablet Take 1 tablet (25 mg total) by mouth 2 (two) times daily. 180 tablet 3  . furosemide (LASIX) 20 MG tablet Take 20 mg by mouth 2 (two) times daily.    Marland Kitchen glucose blood (ACCU-CHEK AVIVA PLUS) test strip 1 each by Other route 3 (three) times daily. 100 each 12  . Lancet Devices (ACCU-CHEK SOFTCLIX) lancets 1 each by Other route 3 (three) times daily. 1 each 0  . losartan (COZAAR) 50 MG tablet Take 1 tablet (50 mg total) by mouth daily. 90 tablet 3  . metFORMIN (GLUCOPHAGE) 1000 MG tablet Take 1 tablet (1,000 mg total) by mouth 2 (two) times daily with a meal. 180 tablet 3  . potassium chloride SA (K-DUR,KLOR-CON) 20 MEQ tablet Take 1 tablet (20 mEq total) by mouth daily. 90 tablet 3  . sitaGLIPtin (JANUVIA) 100 MG tablet Take 1 tablet (100 mg total) by mouth daily. 90 tablet 3  . spironolactone (ALDACTONE) 25 MG tablet Take 1 tablet (25 mg total) by mouth daily. 90 tablet 3   No current facility-administered medications for this visit.      Past Medical History  Diagnosis Date  . Obesity   . 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 History of renal calculi   . Cardiomyopathy     a. 07/2013 Echo: EF 20-25%, diff HK, Gr2 DD, mild MR, mod dil LA/RA.  Marland Kitchen Fracture of left humerus     a. 07/2013.  Marland Kitchen Chronic bronchitis     "get it most q yr" (12/23/2013)  . Kidney stones   . GERD (gastroesophageal reflux disease)     not at present time  . Heart murmur     "born w/it"   . Obesity   . Chronic systolic dysfunction of left ventricle   . Syncope   . Hypertension   . Type II diabetes mellitus 2006  . CHF (congestive heart failure)   . 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 Cholecystectomy  ~ 2010  . Cardiac catheterization  08/10/13  . Ureteroscopy      "laser for kidney stones"  . Orif humerus fracture Left 08/12/2013    Procedure: OPEN REDUCTION INTERNAL FIXATION (ORIF) HUMERAL SHAFT FRACTURE;  Surgeon: Newt Minion, MD;  Location: [facility];  Service: Orthopedics;  Laterality: Left;  Open Reduction Internal Fixation Left Humerus  . Implantable cardioverter defibrillator implant  12/23/2013    STJ Fortify ICD implanted by  [name] for cardiomyopathy and syncope  . Left heart catheterization with coronary angiogram N/A 08/10/2013    Procedure: LEFT HEART CATHETERIZATION WITH CORONARY ANGIOGRAM;  Surgeon: Jettie Booze, MD;  Location: [facility] CATH LAB;  Service: Cardiovascular;  Laterality: N/A;  . Implantable cardioverter defibrillator implant N/A 12/23/2013    Procedure: IMPLANTABLE CARDIOVERTER DEFIBRILLATOR IMPLANT;  Surgeon: Coralyn Mark, MD;  Location: [facility] CATH LAB;  Service: Cardiovascular;  Laterality: N/A;    Social History   Social History  . Marital Status: Single    Spouse Name: N/A  . Number of Children: N/A  . Years of Education: N/A   Occupational History  . Not on file.    Social History Main Topics  . Smoking status: Former Smoker -- 1.00 packs/day for 28 years    Types: Cigarettes    Quit date: 06/03/2014  . Smokeless tobacco: Never Used  . Alcohol Use: 0.0 oz/week    0 Standard drinks or equivalent per week     Comment: 12/23/2013 "might drink a couple times/yr"  . Drug Use: Yes    Special: Marijuana  . Sexual Activity: Yes   Other Topics Concern  . Not on file   Social History Narrative    ROS: no fevers or chills, productive cough, hemoptysis, dysphasia, odynophagia, melena, hematochezia, dysuria, hematuria, rash, seizure activity, orthopnea, PND, pedal edema, claudication. Remaining systems are negative.  Physical Exam: Well-developed well-nourished in no acute distress.  Skin is warm and dry.  HEENT is normal.  Neck is supple.  Chest is clear to auscultation with normal expansion.  Cardiovascular exam is regular rate and rhythm.  Abdominal exam nontender or distended. No masses palpated. Extremities show no edema. neuro grossly intact  ECG sinus rhythm at a rate of 86. Left axis deviation. Nonspecific ST changes.

## 2015-02-02 ENCOUNTER — Telehealth: Payer: Self-pay | Admitting: Family Medicine

## 2015-02-02 NOTE — Telephone Encounter (Signed)
Patient called stating that he needs prior auth for his Metformin and Januvia for his insurance in order to fill his medications. Please f/u

## 2015-02-02 NOTE — Telephone Encounter (Signed)
[place] called requesting prior auth for Jaunmet 50/1000mg . Please f/u at [phone]

## 2015-02-06 ENCOUNTER — Ambulatory Visit (INDEPENDENT_AMBULATORY_CARE_PROVIDER_SITE_OTHER): Payer: Medicaid Other | Admitting: Cardiology

## 2015-02-06 ENCOUNTER — Encounter: Payer: Self-pay | Admitting: Cardiology

## 2015-02-06 DIAGNOSIS — I429 Cardiomyopathy, unspecified: Secondary | ICD-10-CM | POA: Diagnosis not present

## 2015-02-06 DIAGNOSIS — I428 Other cardiomyopathies: Secondary | ICD-10-CM

## 2015-02-06 MED ORDER — DIGOXIN 125 MCG PO TABS
0.1250 mg | ORAL_TABLET | Freq: Every day | ORAL | Status: DC
Start: 2015-02-06 — End: 2015-09-18

## 2015-02-06 NOTE — Patient Instructions (Signed)
Your physician recommends that you schedule a follow-up appointment in: 3 MONTHS WITH DR CRENSHAW  START DIGOXIN 0.125 MG ONCE DAILY

## 2015-02-06 NOTE — Assessment & Plan Note (Signed)
Previous catheterization showed no coronary disease. No further workup.

## 2015-02-06 NOTE — Assessment & Plan Note (Signed)
Continue ARB and beta blocker. Add digoxin 0.125 mg daily. Will consider entresto in the future.

## 2015-02-06 NOTE — Assessment & Plan Note (Signed)
Some of symptoms seem out of proportion to findings on physical. Continue present dose of Lasix. Continue spironolactone. I have asked him to weigh himself daily. I also discussed low-sodium diet. In the future if he continues to have significant symptoms we will consider referral to the CHF clinic and he may benefit from a right heart catheterization.

## 2015-02-06 NOTE — Assessment & Plan Note (Signed)
Followed by electrophysiology. 

## 2015-02-09 ENCOUNTER — Telehealth: Payer: Self-pay | Admitting: Family Medicine

## 2015-02-09 NOTE — Telephone Encounter (Signed)
Spoke with Rosendo Gros from [employer], She stated that any medicine that requires pre-authorization have to be faxed and not electronically sent. She said the medicine is not covered by Medicaid and that Medicaid will give a one time authroization

## 2015-02-09 NOTE — Telephone Encounter (Signed)
Patient called requesting a medication refill for Janumet. [place] on [address], needs authorization to refill. Please call [phone]   Please follow up.

## 2015-02-13 ENCOUNTER — Telehealth: Payer: Self-pay | Admitting: Cardiology

## 2015-02-13 NOTE — Telephone Encounter (Signed)
Spoke with pt and reminded pt of remote transmission that is due today. Pt verbalized understanding.   

## 2015-02-15 ENCOUNTER — Other Ambulatory Visit: Payer: Self-pay | Admitting: Cardiology

## 2015-03-11 DIAGNOSIS — I429 Cardiomyopathy, unspecified: Secondary | ICD-10-CM | POA: Diagnosis not present

## 2015-03-15 ENCOUNTER — Encounter: Payer: Self-pay | Admitting: Internal Medicine

## 2015-03-15 ENCOUNTER — Telehealth: Payer: Self-pay

## 2015-03-15 ENCOUNTER — Ambulatory Visit (INDEPENDENT_AMBULATORY_CARE_PROVIDER_SITE_OTHER): Payer: Medicaid Other | Admitting: *Deleted

## 2015-03-15 NOTE — Telephone Encounter (Signed)
ICM transmission received.  Attempted call to patient and left message on voice mail.

## 2015-03-16 NOTE — Telephone Encounter (Signed)
Unable to reach patient for ICM transmission on 03/11/2015.  Next ICM device transmission scheduled for 04/12/2015.

## 2015-03-16 NOTE — Progress Notes (Signed)
Remote ICD transmission.   

## 2015-03-23 ENCOUNTER — Telehealth: Payer: Self-pay

## 2015-03-23 NOTE — Telephone Encounter (Signed)
januvia and metfomin to replace janumet

## 2015-03-23 NOTE — Telephone Encounter (Signed)
Nurse called pharmacy and made them aware of metformin and januvia taking place of the janumet per Dr. Adrian Blackwater.  Nurse called patient, patient verified date of birth. Patient aware of Januvia and metformin taking place of the janumet.  Patient agrees to pick prescriptions up at pharmacy. Patient voices understanding and has no further questions at this time.

## 2015-03-23 NOTE — Telephone Encounter (Signed)
Nurse spoke to [employer]. Pharmacy needs PA for Janumet, prescribed by Dr. Adrian Blackwater on 10/20/14.

## 2015-03-26 ENCOUNTER — Telehealth: Payer: Self-pay | Admitting: Family Medicine

## 2015-03-26 NOTE — Telephone Encounter (Signed)
Patient and pharmacy called stating that per his insurance patient needs prior auth for Januvia. Please f/u

## 2015-04-01 ENCOUNTER — Encounter (HOSPITAL_COMMUNITY): Payer: Self-pay | Admitting: Vascular Surgery

## 2015-04-01 ENCOUNTER — Observation Stay (HOSPITAL_COMMUNITY)
Admission: EM | Admit: 2015-04-01 | Discharge: 2015-04-02 | Disposition: A | Discharge status: Home / Self Care | Payer: Medicaid Other | Attending: Student in an Organized Health Care Education/Training Program | Admitting: Student in an Organized Health Care Education/Training Program

## 2015-04-01 ENCOUNTER — Emergency Department (HOSPITAL_COMMUNITY): Payer: Medicaid Other

## 2015-04-01 DIAGNOSIS — Z95810 Presence of automatic (implantable) cardiac defibrillator: Secondary | ICD-10-CM | POA: Diagnosis present

## 2015-04-01 DIAGNOSIS — E669 Obesity, unspecified: Secondary | ICD-10-CM | POA: Insufficient documentation

## 2015-04-01 DIAGNOSIS — I509 Heart failure, unspecified: Secondary | ICD-10-CM | POA: Diagnosis not present

## 2015-04-01 DIAGNOSIS — Z79899 Other long term (current) drug therapy: Secondary | ICD-10-CM | POA: Diagnosis not present

## 2015-04-01 DIAGNOSIS — R079 Chest pain, unspecified: Principal | ICD-10-CM | POA: Diagnosis present

## 2015-04-01 DIAGNOSIS — I5022 Chronic systolic (congestive) heart failure: Secondary | ICD-10-CM | POA: Insufficient documentation

## 2015-04-01 DIAGNOSIS — R0602 Shortness of breath: Secondary | ICD-10-CM | POA: Diagnosis not present

## 2015-04-01 DIAGNOSIS — K219 Gastro-esophageal reflux disease without esophagitis: Secondary | ICD-10-CM | POA: Diagnosis present

## 2015-04-01 DIAGNOSIS — E1165 Type 2 diabetes mellitus with hyperglycemia: Secondary | ICD-10-CM | POA: Diagnosis present

## 2015-04-01 DIAGNOSIS — I428 Other cardiomyopathies: Secondary | ICD-10-CM

## 2015-04-01 DIAGNOSIS — I519 Heart disease, unspecified: Secondary | ICD-10-CM | POA: Diagnosis not present

## 2015-04-01 DIAGNOSIS — E119 Type 2 diabetes mellitus without complications: Secondary | ICD-10-CM | POA: Diagnosis not present

## 2015-04-01 DIAGNOSIS — I10 Essential (primary) hypertension: Secondary | ICD-10-CM | POA: Insufficient documentation

## 2015-04-01 DIAGNOSIS — Z87891 Personal history of nicotine dependence: Secondary | ICD-10-CM | POA: Diagnosis not present

## 2015-04-01 DIAGNOSIS — R011 Cardiac murmur, unspecified: Secondary | ICD-10-CM | POA: Diagnosis not present

## 2015-04-01 DIAGNOSIS — G8929 Other chronic pain: Secondary | ICD-10-CM | POA: Diagnosis present

## 2015-04-01 DIAGNOSIS — N200 Calculus of kidney: Secondary | ICD-10-CM | POA: Insufficient documentation

## 2015-04-01 DIAGNOSIS — Z7982 Long term (current) use of aspirin: Secondary | ICD-10-CM | POA: Diagnosis not present

## 2015-04-01 DIAGNOSIS — R0789 Other chest pain: Secondary | ICD-10-CM | POA: Diagnosis present

## 2015-04-01 DIAGNOSIS — I429 Cardiomyopathy, unspecified: Secondary | ICD-10-CM | POA: Diagnosis not present

## 2015-04-01 DIAGNOSIS — IMO0002 Reserved for concepts with insufficient information to code with codable children: Secondary | ICD-10-CM | POA: Diagnosis present

## 2015-04-01 LAB — RAPID URINE DRUG SCREEN, HOSP PERFORMED
Amphetamines: NOT DETECTED
BARBITURATES: NOT DETECTED
BENZODIAZEPINES: NOT DETECTED
COCAINE: NOT DETECTED
OPIATES: NOT DETECTED
TETRAHYDROCANNABINOL: NOT DETECTED

## 2015-04-01 LAB — BRAIN NATRIURETIC PEPTIDE: B Natriuretic Peptide: 19.6 pg/mL (ref 0.0–100.0)

## 2015-04-01 LAB — BASIC METABOLIC PANEL
ANION GAP: 12 (ref 5–15)
BUN: 7 mg/dL (ref 6–20)
CO2: 27 mmol/L (ref 22–32)
Calcium: 9 mg/dL (ref 8.9–10.3)
Chloride: 97 mmol/L — ABNORMAL LOW (ref 101–111)
Creatinine, Ser: 0.93 mg/dL (ref 0.61–1.24)
GFR calc Af Amer: >60 mL/min (ref >60)
Glucose, Bld: 213 mg/dL — ABNORMAL HIGH (ref 65–99)
POTASSIUM: 3.4 mmol/L — AB (ref 3.5–5.1)
Sodium: 136 mmol/L (ref 135–145)

## 2015-04-01 LAB — CBC
HCT: 36 % — ABNORMAL LOW (ref 39.0–52.0)
HEMOGLOBIN: 12.2 g/dL — AB (ref 13.0–17.0)
MCH: 30.4 pg (ref 26.0–34.0)
MCHC: 33.9 g/dL (ref 30.0–36.0)
MCV: 89.8 fL (ref 78.0–100.0)
Platelets: 303 10*3/uL (ref 150–400)
RBC: 4.01 MIL/uL — AB (ref 4.22–5.81)
RDW: 14.1 % (ref 11.5–15.5)
WBC: 6.3 10*3/uL (ref 4.0–10.5)

## 2015-04-01 LAB — GLUCOSE, CAPILLARY: GLUCOSE-CAPILLARY: 298 mg/dL — AB (ref 65–99)

## 2015-04-01 LAB — I-STAT TROPONIN, ED: Troponin i, poc: 0.02 ng/mL (ref 0.00–0.08)

## 2015-04-01 LAB — MAGNESIUM: Magnesium: 1.5 mg/dL — ABNORMAL LOW (ref 1.7–2.4)

## 2015-04-01 MED ORDER — SPIRONOLACTONE 25 MG PO TABS
25.0000 mg | ORAL_TABLET | Freq: Every day | ORAL | Status: DC
  Administered 2015-04-02: 25 mg via ORAL
  Filled 2015-04-01: qty 1

## 2015-04-01 MED ORDER — FUROSEMIDE 20 MG PO TABS
20.0000 mg | ORAL_TABLET | Freq: Two times a day (BID) | ORAL | Status: DC
  Administered 2015-04-01 – 2015-04-02 (×2): 20 mg via ORAL
  Filled 2015-04-01 (×2): qty 1

## 2015-04-01 MED ORDER — DIGOXIN 125 MCG PO TABS
0.1250 mg | ORAL_TABLET | Freq: Every day | ORAL | Status: DC
  Administered 2015-04-02: 0.125 mg via ORAL
  Filled 2015-04-01: qty 1

## 2015-04-01 MED ORDER — ACETAMINOPHEN 650 MG RE SUPP: 650.0000 mg | Freq: Four times a day (QID) | RECTAL | Status: DC | PRN

## 2015-04-01 MED ORDER — PROMETHAZINE HCL 25 MG PO TABS: 12.5000 mg | ORAL_TABLET | Freq: Four times a day (QID) | ORAL | Status: DC | PRN

## 2015-04-01 MED ORDER — CARVEDILOL 25 MG PO TABS
25.0000 mg | ORAL_TABLET | Freq: Two times a day (BID) | ORAL | Status: DC
  Administered 2015-04-01 – 2015-04-02 (×2): 25 mg via ORAL
  Filled 2015-04-01 (×2): qty 1

## 2015-04-01 MED ORDER — INSULIN ASPART 100 UNIT/ML ~~LOC~~ SOLN
0.0000 [IU] | Freq: Three times a day (TID) | SUBCUTANEOUS | Status: DC
  Administered 2015-04-02: 5 [IU] via SUBCUTANEOUS
  Administered 2015-04-02: 8 [IU] via SUBCUTANEOUS

## 2015-04-01 MED ORDER — ASPIRIN EC 81 MG PO TBEC
81.0000 mg | DELAYED_RELEASE_TABLET | Freq: Every day | ORAL | Status: DC
  Administered 2015-04-02: 81 mg via ORAL
  Filled 2015-04-01: qty 1

## 2015-04-01 MED ORDER — POTASSIUM CHLORIDE CRYS ER 20 MEQ PO TBCR
20.0000 meq | EXTENDED_RELEASE_TABLET | Freq: Every day | ORAL | Status: DC
  Administered 2015-04-02: 20 meq via ORAL
  Filled 2015-04-01: qty 1

## 2015-04-01 MED ORDER — GI COCKTAIL ~~LOC~~
30.0000 mL | Freq: Once | ORAL | Status: AC
  Administered 2015-04-01: 30 mL via ORAL
  Filled 2015-04-01: qty 30

## 2015-04-01 MED ORDER — POTASSIUM CHLORIDE CRYS ER 20 MEQ PO TBCR
40.0000 meq | EXTENDED_RELEASE_TABLET | Freq: Once | ORAL | Status: AC
  Administered 2015-04-01: 40 meq via ORAL
  Filled 2015-04-01: qty 2

## 2015-04-01 MED ORDER — PANTOPRAZOLE SODIUM 40 MG PO TBEC
40.0000 mg | DELAYED_RELEASE_TABLET | Freq: Every day | ORAL | Status: DC
  Administered 2015-04-01 – 2015-04-02 (×2): 40 mg via ORAL
  Filled 2015-04-01 (×2): qty 1

## 2015-04-01 MED ORDER — DICLOFENAC SODIUM 1 % TD GEL
2.0000 g | Freq: Four times a day (QID) | TRANSDERMAL | Status: DC
  Administered 2015-04-01 – 2015-04-02 (×2): 2 g via TOPICAL
  Filled 2015-04-01: qty 100

## 2015-04-01 MED ORDER — ACETAMINOPHEN 325 MG PO TABS: 650.0000 mg | ORAL_TABLET | Freq: Four times a day (QID) | ORAL | Status: DC | PRN

## 2015-04-01 MED ORDER — ENOXAPARIN SODIUM 40 MG/0.4ML ~~LOC~~ SOLN
40.0000 mg | SUBCUTANEOUS | Status: DC
  Administered 2015-04-01: 40 mg via SUBCUTANEOUS
  Filled 2015-04-01: qty 0.4

## 2015-04-01 MED ORDER — ZOLPIDEM TARTRATE 5 MG PO TABS
5.0000 mg | ORAL_TABLET | Freq: Once | ORAL | Status: AC
  Administered 2015-04-01: 5 mg via ORAL
  Filled 2015-04-01: qty 1

## 2015-04-01 MED ORDER — LOSARTAN POTASSIUM 50 MG PO TABS
50.0000 mg | ORAL_TABLET | Freq: Every day | ORAL | Status: DC
  Administered 2015-04-02: 50 mg via ORAL
  Filled 2015-04-01: qty 1

## 2015-04-01 NOTE — ED Provider Notes (Signed)
CSN: [id number]     Arrival date & time 04/01/15  1441 History   First MD Initiated Contact with Patient 04/01/15 1509     Chief Complaint  Patient presents with  . Chest Pain     (Consider location/radiation/quality/duration/timing/severity/associated sxs/prior Treatment) The history is provided by the patient.   Mr. Yon is a 49 yo M PMH morbid obesity, former tobacco abuse, uncontrolled type 2 diabetes, nonischemic cardiomyopathy status post ICD placement, and systolic CHF that is presenting with left-sided chest pain and shortness of breath. He has chest pain at baseline but today it was worsening in nature. It is a 7 out of 10 and throbbing. He denies any radiation, vomiting, or diaphoresis but does have some nausea. He was making mashed potatoes on the chest pain occurred. He can occur with activity or rest. He's had no improvement today. He does not take nitroglycerin. He has taken an aspirin today. He is no longer as tobacco abuser and does occasionally smoke marijuana. He denies any travel, changes in his diet and reports being compliant with medication. He feels more tired than normal and reports sleeping well. He denies any orthopnea or PND. He denies any worsening of his edema. He has not seen his cardiologist in 2 months.  He reports never having a stress test.   Past Medical History  Diagnosis Date  . Obesity   . 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 History of renal calculi   . Cardiomyopathy ([place])     a. 07/2013 Echo: EF 20-25%, diff HK, Gr2 DD, mild MR, mod dil LA/RA.  Marland Kitchen Fracture of left humerus     a. 07/2013.  Marland Kitchen Chronic bronchitis ([place])     "get it most q yr" (12/23/2013)  . Kidney stones   . GERD (gastroesophageal reflux disease)     not at present time  . Heart murmur     "born w/it"   . Obesity   . Chronic systolic dysfunction of left ventricle   . Syncope   . Hypertension   . Type II diabetes mellitus ([place])  2006  . CHF (congestive heart failure) ([place])   . 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 Cholecystectomy  ~ 2010  . Cardiac catheterization  08/10/13  . Ureteroscopy      "laser for kidney stones"  . Orif humerus fracture Left 08/12/2013    Procedure: OPEN REDUCTION INTERNAL FIXATION (ORIF) HUMERAL SHAFT FRACTURE;  Surgeon: Newt Minion, MD;  Location: [facility];  Service: Orthopedics;  Laterality: Left;  Open Reduction Internal Fixation Left Humerus  . Implantable cardioverter defibrillator implant  12/23/2013    STJ Fortify ICD implanted by [name] for cardiomyopathy and syncope  . Left heart catheterization with coronary angiogram N/A 08/10/2013    Procedure: LEFT HEART CATHETERIZATION WITH CORONARY ANGIOGRAM;  Surgeon: Jettie Booze, MD;  Location: [facility] CATH LAB;  Service: Cardiovascular;  Laterality: N/A;  . Implantable cardioverter defibrillator implant N/A 12/23/2013    Procedure: IMPLANTABLE CARDIOVERTER DEFIBRILLATOR IMPLANT;  Surgeon: Coralyn Mark, MD;  [facility] CATH LAB;  Service: Cardiovascular;  Laterality: N/A;   Family History  Problem Relation Age of Onset  . Diabetes Father   . Arthritis Father   . Diabetes Mother   . Arthritis Mother   . Hypertension Brother    Social History  Substance Use Topics  . Smoking status:  Former Smoker -- 1.00 packs/day for 28 years    Types: Cigarettes    Quit date: 06/03/2014  . Smokeless tobacco: Never Used  . Alcohol Use: 0.0 oz/week    0 Standard drinks or equivalent per week     Comment: 12/23/2013 "might drink a couple times/yr"    Review of Systems  Constitutional: Negative for fever and chills.  Respiratory: Positive for shortness of breath. Negative for cough.   Cardiovascular: Positive for chest pain.  Gastrointestinal: Negative for nausea, vomiting, abdominal pain, diarrhea and constipation.  Genitourinary: Negative for dysuria.   Musculoskeletal: Negative for joint swelling.  Skin: Negative for rash.  Neurological: Negative for light-headedness.  Psychiatric/Behavioral: Negative for behavioral problems.      Allergies  Bee venom  Home Medications   Prior to Admission medications   Medication Sig Start Date End Date Taking? Authorizing Provider  ACCU-CHEK SOFTCLIX LANCETS lancets 1 each by Other route 3 (three) times daily. Patient taking differently: 1 each by Other route See admin instructions. Check blood sugar once weekly 10/20/14  Yes Josalyn Funches, MD  aspirin 81 MG tablet Take 81 mg by mouth daily.   Yes Historical Provider, MD  Blood Glucose Monitoring Suppl (ACCU-CHEK AVIVA PLUS) W/DEVICE KIT 1 Device by Does not apply route 3 (three) times daily after meals. Patient taking differently: 1 Device by Does not apply route See admin instructions. Check blood sugar once weekly 10/20/14  Yes Josalyn Funches, MD  carvedilol (COREG) 25 MG tablet Take 1 tablet (25 mg total) by mouth 2 (two) times daily. 12/29/14  Yes Lelon Perla, MD  digoxin (LANOXIN) 0.125 MG tablet Take 1 tablet (0.125 mg total) by mouth daily. 02/06/15  Yes Lelon Perla, MD  furosemide (LASIX) 20 MG tablet Take 20 mg by mouth 2 (two) times daily.   Yes Historical Provider, MD  glucose blood (ACCU-CHEK AVIVA PLUS) test strip 1 each by Other route 3 (three) times daily. Patient taking differently: 1 each by Other route See admin instructions. Check blood sugar once weekly 10/20/14  Yes Boykin Nearing, MD  Lancet Devices [facility]) lancets 1 each by Other route 3 (three) times daily. Patient taking differently: 1 each by Other route See admin instructions. Check blood sugar once weekly 10/20/14  Yes Josalyn Funches, MD  losartan (COZAAR) 50 MG tablet Take 1 tablet (50 mg total) by mouth daily. 11/20/14  Yes Lelon Perla, MD  metFORMIN (GLUCOPHAGE) 1000 MG tablet Take 1 tablet (1,000 mg total) by mouth 2 (two) times daily with a  meal. 12/25/14  Yes Josalyn Funches, MD  potassium chloride SA (K-DUR,KLOR-CON) 20 MEQ tablet Take 1 tablet (20 mEq total) by mouth daily. 11/20/14  Yes Lelon Perla, MD  sitaGLIPtin-metformin (JANUMET) 50-1000 MG tablet Take 1 tablet by mouth 2 (two) times daily with a meal.   Yes Historical Provider, MD  spironolactone (ALDACTONE) 25 MG tablet TAKE ONE TABLET BY MOUTH DAILY Patient taking differently: Take 25 mg by mouth once daily 02/15/15  Yes Lelon Perla, MD  sitaGLIPtin (JANUVIA) 100 MG tablet Take 1 tablet (100 mg total) by mouth daily. 12/25/14   Josalyn Funches, MD   BP 121/83 mmHg  Pulse 71  Temp(Src) 97.6 F (36.4 C) (Oral)  Resp 22  SpO2 96% Physical Exam  Constitutional: He is oriented to person, place, and time. He appears well-developed and well-nourished.  HENT:  Head: Normocephalic and atraumatic.  Eyes: Conjunctivae and EOM are normal.  Neck: Normal range of motion.  Neck supple.  Cardiovascular: Normal rate, regular rhythm, normal heart sounds and intact distal pulses.  Exam reveals no friction rub.   No murmur heard. Pulmonary/Chest: Effort normal and breath sounds normal. No respiratory distress. He has no wheezes.  Abdominal: Soft. Bowel sounds are normal. He exhibits no distension. There is no tenderness. There is no rebound.  Musculoskeletal: Normal range of motion. He exhibits no edema.  Neurological: He is alert and oriented to person, place, and time.  Skin: Skin is warm. No rash noted.  Psychiatric: He has a normal mood and affect.    ED Course  Procedures (including critical care time) Labs Review Labs Reviewed  BASIC METABOLIC PANEL - Abnormal; Notable for the following:    Potassium 3.4 (*)    Chloride 97 (*)    Glucose, Bld 213 (*)    All other components within normal limits  CBC - Abnormal; Notable for the following:    RBC 4.01 (*)    Hemoglobin 12.2 (*)    HCT 36.0 (*)    All other components within normal limits  BRAIN NATRIURETIC  PEPTIDE  URINE RAPID DRUG SCREEN, HOSP PERFORMED  I-STAT TROPOININ, ED    Imaging Review Dg Chest 2 View  04/01/2015  CLINICAL DATA:  Left-sided chest pain 2 days. EXAM: CHEST  2 VIEW COMPARISON:  11/23/2014 and 09/08/2014 FINDINGS: Left-sided pacemaker unchanged. Lungs are adequately inflated without consolidation or effusion. Borderline stable cardiomegaly. Remainder of the exam is unchanged. IMPRESSION: No active cardiopulmonary disease. Electronically Signed   By: Marin Olp M.D.   On: 04/01/2015 15:45   I have personally reviewed and evaluated these images and lab results as part of my medical decision-making.   EKG Interpretation   Date/Time:  Sunday April 01 2015 14:43:27 EDT Ventricular Rate:  79 PR Interval:  170 QRS Duration: 110 QT Interval:  354 QTC Calculation: 405 R Axis:   138 Text Interpretation:  Normal sinus rhythm Right axis deviation Cannot rule  out Anterior infarct , age undetermined Abnormal ECG Confirmed by BEATON   MD, ROBERT ([id number]) on 04/01/2015 3:08:49 PM      MDM   Final diagnoses:  Chest pain, unspecified chest pain type   Mr. Durfee is presenting with chest pain and shortness of breath.  Chest pain is worsened today than normal as well as shortness of breath. Heart score 4. EKG and istat troponin normal.  Patient has taken ASA today. Spoke with Cardiology (Dr. Martinique) and they didn't feel cardiology consult was warranted at this time. Discussed with medicine and will admit for chest pain observation.   Rosemarie Ax, MD PGY-3, [facility] 04/01/2015, 4:48 PM     Rosemarie Ax, MD 04/01/15 1651  Leonard Schwartz, MD 04/01/15 [phone]

## 2015-04-01 NOTE — ED Notes (Signed)
Pt reports to the ED for eval of left sided throbbing CP x 1 week. Reports he has become increasingly fatigued and SOB so he decided to come in. Reports associated symptoms of lightheadedness and nausea as well. Pt has significant medical hx including defib placement and CHF. Pt A&Ox4, resp e/u, and skin warm and dry.

## 2015-04-01 NOTE — ED Notes (Signed)
Patient transported to X-ray 

## 2015-04-01 NOTE — ED Notes (Signed)
Admitting Physicians at bedside.

## 2015-04-01 NOTE — H&P (Signed)
Date: 04/01/2015               Patient Name:  [name] MRN: [id number]  DOB: 1965-07-24 Age / Sex: 49 y.o., male   PCP: Boykin Nearing, MD         Medical Service: Internal Medicine Teaching Service         Attending Physician: Dr. Axel Filler, MD    First Contact: Dr. Blane Ohara Pager: [phone]  Second Contact: Dr. Michail Jewels Pager: [phone]       After Hours (After 5p/  First Contact Pager: [phone]  weekends / holidays): Second Contact Pager: [phone]   Chief Complaint: Chest pain and shortness of breath   History of Present Illness: [name] is a 49yo M with PMH of CHF/NICM (last EF 25-30% in 11/2014) s/p ICD placement (12/2013), morbid obesity, anxiety, DM2, HTN, GERD, and chronic atypical chest pain who presents with worsening of his usual chest pain over the past week, along with a 2-3 month history of progressive fatigue. He describes the pain as a squeezing in his left chest that does not radiate, occurs during both exertion and at rest, and is not relieved by anything in particular. He describes these as pain spells that usually last only seconds to minutes and have been occurring for at least several months so far. He says that his pains have become more frequent and more severe particularly over the past week, but have not changed in quality. He says he also has been more fatigued over the past few months, with dyspnea on exertion but not at rest when he sometimes has the chest pain. He also gets occasionally lightheaded and nauseous when exerting himself and becoming short of breath, but otherwise denies other symptoms, including fevers, changes in weight, blurred vision, palpitations, diaphoresis, cough, abdominal pain, leg swelling or changes in bowel or urinary function. He says that he has been compliant with all of his medications except missing a single dose of lasix a few days ago. He denies sick contacts, recent prolonged immobilization, or recent  travel.  Patient is a 30 pack year smoker, who quit approximately 1 year ago. He denies a history of alcohol abuse or any other recreational drug use.  When pressed further, patient says that he has been having increased fatigue and dyspnea on exertion for the past few months, but has had difficulty getting in to see his doctor, so he felt that it would be best to come to the ED.  Meds: Current Facility-Administered Medications  Medication Dose Route Frequency Provider Last Rate Last Dose  . potassium chloride SA (K-DUR,KLOR-CON) CR tablet 40 mEq  40 mEq Oral Once Jones Bales, MD        Allergies: Allergies as of 04/01/2015 - Review Complete 04/01/2015  Allergen Reaction Noted  . Bee venom Anaphylaxis 10/20/2013   Past Medical History  Diagnosis Date  . Obesity   . 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 History of renal calculi   . Cardiomyopathy ([place])     a. 07/2013 Echo: EF 20-25%, diff HK, Gr2 DD, mild MR, mod dil LA/RA.  Marland Kitchen Fracture of left humerus     a. 07/2013.  Marland Kitchen Chronic bronchitis ([place])     "get it most q yr" (12/23/2013)  . Kidney stones   . GERD (gastroesophageal reflux disease)     not at present time  . Heart murmur     "born  w/it"   . Obesity   . Chronic systolic dysfunction of left ventricle   . Syncope   . Hypertension   . Type II diabetes mellitus ([place]) 2006  . CHF (congestive heart failure) ([place])   . 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 Cholecystectomy  ~ 2010  . Cardiac catheterization  08/10/13  . Ureteroscopy      "laser for kidney stones"  . Orif humerus fracture Left 08/12/2013    Procedure: OPEN REDUCTION INTERNAL FIXATION (ORIF) HUMERAL SHAFT FRACTURE;  Surgeon: Newt Minion, MD;  Location: [facility];  Service: Orthopedics;  Laterality: Left;  Open Reduction Internal Fixation Left Humerus  . Implantable  cardioverter defibrillator implant  12/23/2013    STJ Fortify ICD implanted by [name] for cardiomyopathy and syncope  . Left heart catheterization with coronary angiogram N/A 08/10/2013    Procedure: LEFT HEART CATHETERIZATION WITH CORONARY ANGIOGRAM;  Surgeon: Jettie Booze, MD;  Location: [facility] CATH LAB;  Service: Cardiovascular;  Laterality: N/A;  . Implantable cardioverter defibrillator implant N/A 12/23/2013    Procedure: IMPLANTABLE CARDIOVERTER DEFIBRILLATOR IMPLANT;  Surgeon: Coralyn Mark, MD;  Location: [facility] CATH LAB;  Service: Cardiovascular;  Laterality: N/A;   Family History  Problem Relation Age of Onset  . Diabetes Father   . Arthritis Father   . Diabetes Mother   . Arthritis Mother   . Hypertension Brother    Social History   Social History  . Marital Status: Single    Spouse Name: N/A  . Number of Children: N/A  . Years of Education: N/A   Occupational History  . Not on file.   Social History Main Topics  . Smoking status: Former Smoker -- 1.00 packs/day for 28 years    Types: Cigarettes    Quit date: 06/03/2014  . Smokeless tobacco: Never Used  . Alcohol Use: 0.0 oz/week    0 Standard drinks or equivalent per week     Comment: 12/23/2013 "might drink a couple times/yr"  . Drug Use: Yes    Special: Marijuana  . Sexual Activity: Yes   Other Topics Concern  . Not on file   Social History Narrative    Review of Systems: Pertinent items noted in HPI and remainder of comprehensive ROS otherwise negative.  Physical Exam: Blood pressure 134/77, pulse 88, temperature 97.8 F (36.6 C), temperature source Oral, resp. rate 18, SpO2 99 %. BP 134/77 mmHg  Pulse 88  Temp(Src) 97.8 F (36.6 C) (Oral)  Resp 18  SpO2 99%  General Appearance:    Alert, cooperative, no distress, appears stated age  Head:    Normocephalic, without obvious abnormality, atraumatic  Eyes:    PERRL, conjunctiva/corneas clear, EOM's intact, fundi    benign, both eyes       Ears:     Normal TM's and external ear canals, both ears  Nose:   Nares normal, septum midline, mucosa normal, no drainage    or sinus tenderness  Throat:   Lips, mucosa, and tongue normal; teeth and gums normal  Neck:   Supple, symmetrical, trachea midline, no adenopathy;       thyroid:  No enlargement/tenderness/nodules; no carotid   bruit or JVD  Back:     Symmetric, no curvature, ROM normal, no CVA tenderness  Lungs:     Clear to auscultation bilaterally, respirations unlabored  Chest wall:    No tenderness or deformity  Heart:  Regular rate and rhythm, S1 and S2 normal, no murmur, rub   or gallop  Abdomen:     Soft, non-tender, bowel sounds active all four quadrants,    no masses, no organomegaly  Extremities:   Extremities normal, atraumatic, no cyanosis or edema  Pulses:   2+ and symmetric all extremities  Skin:   Skin color, texture, turgor normal, no rashes or lesions  Lymph nodes:   Cervical, supraclavicular, and axillary nodes normal  Neurologic:   CNII-XII intact. Normal strength, sensation and reflexes      throughout    Lab results: Basic Metabolic Panel:  Recent Labs  04/01/15 1456  NA 136  K 3.4*  CL 97*  CO2 27  GLUCOSE 213*  BUN 7  CREATININE 0.93  CALCIUM 9.0   CBC:  Recent Labs  04/01/15 1456  WBC 6.3  HGB 12.2*  HCT 36.0*  MCV 89.8  PLT 303   Urine Drug Screen: Drugs of Abuse     Component Value Date/Time   LABOPIA NONE DETECTED 04/01/2015 1649   COCAINSCRNUR NONE DETECTED 04/01/2015 1649   LABBENZ NONE DETECTED 04/01/2015 1649   AMPHETMU NONE DETECTED 04/01/2015 1649   THCU NONE DETECTED 04/01/2015 1649   LABBARB NONE DETECTED 04/01/2015 1649    Imaging results:  Dg Chest 2 View  04/01/2015  CLINICAL DATA:  Left-sided chest pain 2 days. EXAM: CHEST  2 VIEW COMPARISON:  11/23/2014 and 09/08/2014 FINDINGS: Left-sided pacemaker unchanged. Lungs are adequately inflated without consolidation or effusion. Borderline stable cardiomegaly. Remainder  of the exam is unchanged. IMPRESSION: No active cardiopulmonary disease. Electronically Signed   By: Marin Olp M.D.   On: 04/01/2015 15:45    Other results: EKG: normal EKG, normal sinus rhythm, unchanged from previous tracings.  Assessment & Plan by Problem: Principal Problem:   Chronic chest pain Active Problems:   GERD   DM (diabetes mellitus), type 2, uncontrolled (HCC)   Nonischemic cardiomyopathy (HCC)   Chronic systolic dysfunction of left ventricle   ICD (implantable cardioverter-defibrillator) in place  1. Atypical chest pain w/ h/o NICM s/p ICD placement - patient reports brief spells of squeezing left sided pain, with progressive fatigue over the past few months. Physical exam is normal, EKG is normal and unchaged from previous exams, with initial troponins negative, BNP normal, and CXR negative. He has an extensive cardiac history - CHF/NICM (last EF 25-30% in 11/2014) s/p ICD placement (12/2013) with well-documented atypical chest pain. LHC 08/2013 showed normal coronary arteries. While CV cause cannot be excluded at this time, symptoms possibly from GERD vs MSK vs anxiety exacerbating symptoms. -Consulted cardiology for further recs -Follow-up troponins and EKG -Cardiac monitoring -Start PPI trial, GI cocktail -Voltaren gel PRN for pain -Home phenergan PRN for nausea -Consider consulting social work for assistance with counseling services as chronic anxiety may be contributing to his current symptoms  2. DM2 -Continue home sitagliptin/metformin -SSI  3. HTN -Continue home carvedilol, furosemide, losartan, spironolactone  Diet: Cardiac  PPX -DVT - Lovenox  Dispo: Disposition is deferred at this time, awaiting improvement of current medical problems. Anticipated discharge in approximately 1-2 day(s).   The patient does have a current PCP (Boykin Nearing, MD) and does need an [facility] hospital follow-up appointment after discharge.  The patient does not have  transportation limitations that hinder transportation to clinic appointments.  Signed: Norval Gable, MD 04/01/2015, 7:16 PM

## 2015-04-02 DIAGNOSIS — R079 Chest pain, unspecified: Secondary | ICD-10-CM | POA: Diagnosis not present

## 2015-04-02 DIAGNOSIS — I5022 Chronic systolic (congestive) heart failure: Secondary | ICD-10-CM | POA: Insufficient documentation

## 2015-04-02 DIAGNOSIS — G8929 Other chronic pain: Secondary | ICD-10-CM

## 2015-04-02 DIAGNOSIS — R0602 Shortness of breath: Secondary | ICD-10-CM | POA: Diagnosis not present

## 2015-04-02 DIAGNOSIS — I429 Cardiomyopathy, unspecified: Secondary | ICD-10-CM

## 2015-04-02 DIAGNOSIS — E669 Obesity, unspecified: Secondary | ICD-10-CM | POA: Diagnosis not present

## 2015-04-02 LAB — GLUCOSE, CAPILLARY
GLUCOSE-CAPILLARY: 214 mg/dL — AB (ref 65–99)
Glucose-Capillary: 251 mg/dL — ABNORMAL HIGH (ref 65–99)

## 2015-04-02 LAB — CUP PACEART REMOTE DEVICE CHECK
Battery Remaining Longevity: 94 mo
Battery Remaining Percentage: 88 %
Date Time Interrogation Session: 20161002060041
HIGH POWER IMPEDANCE MEASURED VALUE: 72 Ohm
HighPow Impedance: 72 Ohm
Lead Channel Impedance Value: 400 Ohm
Lead Channel Setting Pacing Amplitude: 2.5 V
Lead Channel Setting Sensing Sensitivity: 0.5 mV
MDC IDC LEAD IMPLANT DT: 20150717
MDC IDC LEAD LOCATION: 753860
MDC IDC MSMT BATTERY VOLTAGE: 3.1 V
MDC IDC MSMT LEADCHNL RV PACING THRESHOLD AMPLITUDE: 0.75 V
MDC IDC MSMT LEADCHNL RV PACING THRESHOLD PULSEWIDTH: 0.5 ms
MDC IDC MSMT LEADCHNL RV SENSING INTR AMPL: 12 mV
MDC IDC PG SERIAL: 7196009
MDC IDC SET LEADCHNL RV PACING PULSEWIDTH: 0.5 ms
MDC IDC STAT BRADY RV PERCENT PACED: 1 %

## 2015-04-02 LAB — HEMOGLOBIN A1C
Hgb A1c MFr Bld: 10.2 % — ABNORMAL HIGH (ref 4.8–5.6)
Mean Plasma Glucose: 246 mg/dL

## 2015-04-02 LAB — TROPONIN I

## 2015-04-02 LAB — HIV ANTIBODY (ROUTINE TESTING W REFLEX): HIV SCREEN 4TH GENERATION: NONREACTIVE

## 2015-04-02 MED ORDER — MAGNESIUM SULFATE 2 GM/50ML IV SOLN
2.0000 g | Freq: Once | INTRAVENOUS | Status: AC
  Administered 2015-04-02: 2 g via INTRAVENOUS
  Filled 2015-04-02: qty 50

## 2015-04-02 MED ORDER — PANTOPRAZOLE SODIUM 40 MG PO TBEC: 40.0000 mg | DELAYED_RELEASE_TABLET | Freq: Every day | ORAL | Status: DC

## 2015-04-02 NOTE — Progress Notes (Signed)
Subjective: [name] is a 49yo M with PMH of CHF/NICM (last EF 25-30% in 11/2014) s/p ICD placement (12/2013), morbid obesity, anxiety, DM2, HTN, GERD, and chronic atypical chest pain who presents with worsening of his usual chest pain over the past week, along with a 2-3 month history of progressive fatigue. He had no acute events overnight. He says he had some brief episodes of chest pain last night, but felt much better with the GI cocktail and has not had any repeat episodes of chest pain since this morning. He denies any other symptoms at this time.  Objective: Vital signs in last 24 hours: Filed Vitals:   04/01/15 1843 04/01/15 2012 04/02/15 0550 04/02/15 0942  BP: 134/77 116/62 113/69 118/66  Pulse: 88   64  Temp: 97.8 F (36.6 C) 98.5 F (36.9 C) 97.7 F (36.5 C)   TempSrc: Oral Oral Oral   Resp: 18 19    SpO2: 99% 98% 97%    Weight change:   Intake/Output Summary (Last 24 hours) at 04/02/15 1020 Last data filed at 04/01/15 2011  Gross per 24 hour  Intake      0 ml  Output      0 ml  Net      0 ml   BP 118/66 mmHg  Pulse 64  Temp(Src) 97.7 F (36.5 C) (Oral)  Resp 19  SpO2 97%  General Appearance:    Alert, cooperative, no distress, appears stated age  Lungs:     Clear to auscultation bilaterally, respirations unlabored  Chest wall:    No tenderness or deformity  Heart:    Regular rate and rhythm, S1 and S2 normal, no murmur, rub   or gallop  Abdomen:     Soft, non-tender, bowel sounds active all four quadrants,    no masses, no organomegaly  Extremities:   Extremities normal, atraumatic, no cyanosis or edema  Pulses:   2+ and symmetric all extremities  Skin:   Skin color, texture, turgor normal, no rashes or lesions  Lymph nodes:   Cervical, supraclavicular, and axillary nodes normal  Neurologic:   CNII-XII intact. Normal strength, sensation and reflexes      throughout   Lab Results: Basic Metabolic Panel:  Recent Labs Lab 04/01/15 1456 04/01/15 1855    NA 136  --   K 3.4*  --   CL 97*  --   CO2 27  --   GLUCOSE 213*  --   BUN 7  --   CREATININE 0.93  --   CALCIUM 9.0  --   MG  --  1.5*   CBC:  Recent Labs Lab 04/01/15 1456  WBC 6.3  HGB 12.2*  HCT 36.0*  MCV 89.8  PLT 303   Cardiac Enzymes:  Recent Labs Lab 04/02/15 0321  TROPONINI <0.03  CBG:  Recent Labs Lab 04/01/15 2123 04/02/15 0729  GLUCAP 298* 214*   Hemoglobin A1C:  Recent Labs Lab 04/01/15 2030  HGBA1C 10.2*   Studies/Results: Dg Chest 2 View  04/01/2015  CLINICAL DATA:  Left-sided chest pain 2 days. EXAM: CHEST  2 VIEW COMPARISON:  11/23/2014 and 09/08/2014 FINDINGS: Left-sided pacemaker unchanged. Lungs are adequately inflated without consolidation or effusion. Borderline stable cardiomegaly. Remainder of the exam is unchanged. IMPRESSION: No active cardiopulmonary disease. Electronically Signed   By: Marin Olp M.D.   On: 04/01/2015 15:45   Assessment/Plan: Principal Problem:   Chronic chest pain Active Problems:   GERD   DM (diabetes mellitus), type  2, uncontrolled ([place])   Nonischemic cardiomyopathy ([place])   Chronic systolic dysfunction of left ventricle   ICD (implantable cardioverter-defibrillator) in place   Chronic systolic CHF (congestive heart failure) ([place]) 1. Atypical chest pain w/ h/o NICM s/p ICD placement - patient reports brief spells of squeezing left sided pain, with progressive fatigue over the past few months. Physical exam is normal, EKG is normal and unchaged from previous exams, with initial troponins negative, BNP normal, and CXR negative. He has an extensive cardiac history - CHF/NICM (last EF 25-30% in 11/2014) s/p ICD placement (12/2013) with well-documented atypical chest pain. LHC 08/2013 showed normal coronary arteries. Symptoms possibly from GERD vs MSK vs anxiety exacerbating symptoms, highly unlikely current symptoms are CV/ischemic in nature. -Consulted cardiology - no further CV workup indicated  inpatient -Cardiac monitoring -Continue PPI trial, GI cocktail PRN -Voltaren gel PRN for pain -Home phenergan PRN for nausea -Consider consulting social work for assistance with counseling services as chronic anxiety may be contributing to his current symptoms  Dispo: Disposition is deferred at this time, awaiting improvement of current medical problems.  Anticipated discharge today.  The patient does have a current PCP (Boykin Nearing, MD) and does need an [facility] hospital follow-up appointment after discharge.  The patient does not have transportation limitations that hinder transportation to clinic appointments.    [name] Gable, MD 04/02/2015, 10:20 AM

## 2015-04-02 NOTE — Discharge Instructions (Signed)
[name] - please take the acid reflux medication (pantoprazole) that we have prescribed and if you still have recurrence of you chest pain, try taking Maalox (or the generic equivalent which you can get over-the-counter).  Please keep your follow-up appointments; this is very important for your continued recovery.    We have made the following additions/changes to your medications:  Please refer to your medication list.    Please continue to take all of your medications as prescribed.  Do not miss any doses without contacting your primary physician.  If you have questions, please contact your physician or contact the Internal Medicine Teaching Service at [phone].  Please bring your medicications with you to your appointments; medications may be eye drops, herbals, vitamins, or pills.    If you believe you are suffering from a life-threatening emergency, go to the nearest Emergency Department.      [name] Guide  1) Find a Charity fundraiser Although you won't have to find out who is covered by [employer] plan, it is a good idea to ask around and get recommendations. You will then need to call the office and see if the doctor you have chosen will accept you as a new patient and what types of options they offer for patients who are self-pay. Some doctors offer discounts or will set up payment plans for their patients who do not have insurance, but you will need to ask so you aren't surprised when you get to your appointment.  2) Contact Your Local Health Department Not all health departments have doctors that can see patients for sick visits, but many do, so it is worth a call to see if yours does. If you don't know where your local health department is, you can check in your phone book. The CDC also has a tool to help you locate your state's health department, and many state websites also have listings of all of their local health departments.  3) Find a [facility] If your illness is not likely to be very severe or complicated, you may want to try a walk in clinic. These are popping up all over the country in pharmacies, drugstores, and shopping centers. They're usually staffed by nurse practitioners or physician assistants that have been trained to treat common illnesses and complaints. They're usually fairly quick and inexpensive. However, if you have serious medical issues or chronic medical problems, these are probably not your best option.  No Primary Care Doctor: - Call Health Connect at  [phone] - they can help you locate a primary care doctor that  accepts your insurance, provides certain services, etc. - Physician Referral Service- [phone]  Chronic Pain Problems: Organization         Address  Phone   Notes  [facility]  [phone] Patients need to be referred by their primary care doctor.   Medication Assistance: Organization         Address  Phone   Notes  [facility] Medication [facility] [address] [place], [place] [address] [phone] --Must be a resident of [facility] -- Must have NO insurance coverage whatsoever (no Medicaid/ Medicare, etc.) -- The pt. MUST have a primary care doctor that directs their care regularly and follows them in the community   MedAssist  [phone]   [employer]  [phone]    Agencies that provide inexpensive medical care: Organization  Address  Phone   Notes  [place]  [phone]   [facility]    [phone]   [facility] [place], [place] [address] [phone]   [place] [address] [place] [place] [phone]   Planned Parenthood    [phone]   [facility]    [phone]   [place] and [place] [address], [employer] Phone:  [phone], Fax:  [phone] Hours  of Operation:  9 am - 6 pm, M-F.  Also accepts Medicaid/Medicare and self-pay.  [facility] for Cecilia [place], [address], [place] Phone: [phone], Fax: [phone]. Hours of Operation:  8:30 am - 5:30 pm, M-F.  Also accepts Medicaid and self-pay.  [facility] [place] [address], [place] Phone: [phone]   [place], [place], [place] [phone], Ext. [phone] Mondays & Thursdays: 7-9 AM.  First 15 patients are seen on a first come, first serve basis.    [place] Providers:  Organization         Address  Phone   Notes  [facility] [address], [place] [phone] Also accepts self-pay patients.  [facility] [address], [place]  [phone]   [place], [address], [place] [phone]   [facility] [address] [place] [phone]   Lucianne Lei [address], [place]   [phone] Only accepts [place] Access [place] patients after they have their name applied to their card.   Self-Pay (no insurance) in [facility]:  Organization         Address  Phone   Notes  Sickle Cell Patients, [facility] Internal Medicine [place] [phone]   [facility] [place] [phone]   [facility] [place]  [place], [place], [place] [phone]   [facility]/Dr. Osei-Bonsu  [address], [place] or [address], [place] [phone] Phone number for both [place] and [place] locations is the same.  [facility] and [facility] [address], [place] [phone]   [facility] [address] [place] or [address] [phone]   [facility] [address], [place] [phone], phone; [phone], fax Sees patients 1st and 3rd Saturday of every month.  Must not qualify for public or private insurance (i.e. Medicaid, Medicare, [place] Health Choice, Veterans' Benefits)  Household income should be no more than 200% of the poverty level The clinic cannot treat you if you are pregnant or think you are pregnant  Sexually transmitted diseases are not treated at the clinic.    Dental Care: Organization         Address  Phone  Notes  [facility] Department of [facility] [place] [phone] Accepts children up to age 82 who are enrolled in [place] or [place]; pregnant women with a Medicaid card; and children who have applied for Medicaid or [place] Health Choice, but were declined, whose parents can pay a reduced fee at time  of service.  [facility] Department of [facility]  [address], [place] [phone] Accepts children up to age 31 who are enrolled in [place] or [place]; pregnant women with a Medicaid card; and children who have applied for Medicaid or [place] Health Choice, but were declined, whose parents can pay a reduced fee at time of service.  [place] Adult [facility] Access PROGRAM  [place] [phone] Patients are seen by appointment only. Walk-ins are not accepted. [place] will see patients 2 years of age and older. Monday - Tuesday (8am-5pm) Most Wednesdays (8:30-5pm) $30 per visit, cash only  [facility] Adult Dental Access PROGRAM  [address], [facility] [phone] Patients are seen by appointment only. Walk-ins are not accepted. [place] will see patients 72 years of age and older. One Wednesday Evening (Monthly: Volunteer Based).  $30 per visit, cash only  [place]  [phone] for adults; Children under age 58, call Graduate  Pediatric Dentistry at [phone]. Children aged 31-14, please call [phone] to request a pediatric application.  Dental services are provided in all areas of dental care including fillings, crowns and bridges, complete and partial dentures, implants, gum treatment, root canals, and extractions. Preventive care is also provided. Treatment is provided to both adults and children. Patients are selected via a lottery and there is often a waiting list.   [facility] [address], [place]  [phone] [email or web address]   [facility] [address] [place], [place] [phone], Ext. [phone] Second and Fourth Thursday of each month, opens at 6:30 AM; Clinic ends at 9 AM.  Patients are seen on a first-come first-served basis, and a limited number are seen during each clinic.   [facility]  [address] Hillard Danker [place], [place] [phone]   Eligibility Requirements You must have lived in [place], [place], or [place] counties for at least the last three months.   You cannot be eligible for state or federal sponsored [employer], including [employer], [place], or [employer].   You generally cannot be eligible for healthcare insurance through your employer.    How to apply: Eligibility screenings are held every Tuesday and Wednesday afternoon from 1:00 pm until 4:00 pm. You do not need an appointment for the interview!  [facility] [address] [place], [place]   Mobile  [place] Department  [place]  [phone]    Behavioral Health Resources in the Community: Intensive Outpatient Programs Organization         Address  Phone  Notes  [place]. [address] [place], [place] [phone]   [facility] Outpatient [address], [place], [place]   ADS: Alcohol & Drug Svcs [address], [place], [place]   [place] [address],  [place], [place] or [phone]   Substance Abuse Resources Organization         Address  Phone  Notes  Alcohol and Drug Services  [phone]   [place]  [phone]   [facility] [place]   Chinita Pester  [phone]   Residential & Outpatient Substance Abuse Program  [phone]   Psychological Services Organization         Address  Phone  Notes  [facility]  [place]  [phone]   [place] [address], [place] or [phone]    Mobile Crisis Teams Organization         Address  Phone  Notes  [facility], Mobile Crisis Care Unit  [phone]   [facility]  [address] [place], [place]   [place] [address], [place] [phone]    Self-Help/Support Groups Organization         Address  Phone             Notes  [place]. of [place] - variety of support groups  [place] Call for more information  Narcotics Anonymous (NA), [facility] [address], [employer] [place]  2 meetings at this location   Special educational needs teacher         Address  Phone  Notes  [facility] Residential Treatment [place],    [place]  [phone]   [facility]  [address] [place] [phone], [place], [place]   [place], [place] [phone] Admissions: 8am-3pm M-F  Incentives Substance [place] [address]    [place], [place] [phone]   [facility] [address] [place], [place], [place]   [facility] [place] [address]  [place], [place]   [facility]ight Programs - Intensive Outpatient [address] Kristeen Mans [address], [place], [place]   [facility] [place].,  [place], [place] [phone] or [phone]   [facility] (RT[facility]) [address] [place], [place] Accepts Medicaid  [facility] [place] [address],  [place] [phone] Substance Abuse/Addiction Treatment   [facility] Organization         Address  Phone  Notes  [facility]  [phone]   Domenic Schwab, PhD [address] Arlis Porta [place], [place]   [phone] or [phone]   [place], [place] [phone]   [facility] [address], [place], [place] [phone] Insurance/Medicaid/sponsorship through [facility] and Families [address] [place]                                    [place], [place] [phone] [place] [address], [place] [phone]    Dr. Adele Schilder  [phone]   [facility] Dept. 1) [address] [place] 2) [place] 3)  [place] [address], [place] [phone]  [phone]   [place] [phone] or [phone] (After Hours)

## 2015-04-02 NOTE — Consult Note (Signed)
CARDIOLOGY CONSULT NOTE   Patient ID: [name] MRN: [id number] DOB/AGE: [birth year] 49 y.o.  Admit date: 04/01/2015  Primary Physician   [name] Ends, MD Primary Cardiologist: [name] Electrophysiologist: Allred Reason for Consultation   CP   HPI: [name] is a 49 y.o. male with a history of CHF, NICM s/p ICD, chronic chest pain, HTN, DM, syncope,  former smoker and  GERD who admitted 04/01/15 with worsening CP, SOB and fatigue.  Echo 2/15 revealed EF of 20-25% with diffuse hypokinesis and biatrial enlargement, mild MR and a small pericardial effusion.  Cardiac catheterization in March of 2015 showed normal coronary arteries. Implantation of a STJ single chamber ICD on 12-23-13 by [name]. Echocardiogram repeated in June 2016 and showed ejection fraction 25-30%, moderate left ventricular hypertrophy, grade 1 diastolic dysfunction and moderate left atrial enlargement.  Last seen by Dr. Stanford [name] 02/06/2015. At that time his CP symptoms seemed out of proportion of physical exam findings,  digoxin added and plan for  referral to the CHF clinic and may benefit from a right heart catheterization.   Since then his chest pain progressively become worsened. He described the pain as sometimes "tightness or sharp "to his left chest. His pain does not necessarily occur with the exertional. Intermittently he gets left-sided chest pain with rest or exertional, lasts for a few seconds and resolved by itself. Nothing makes it worse or better. For the past few months he also been having increasing shortness of breath with exertion and extreme fatigue. He states he is compliant with medication. Laying flat makes his shortness of breath worse. He denies palpitations, lower extremity edema, headache, melena, blood in his stool. Yesterday he had chest pain while cooking. Due to worsening symptoms he presented for further evaluation.   Patient is a 30-pack-year smoker who quit one year ago. No  history of illicit drug use or alcohol abuse. Chest x-ray without acute cardiopulmonary disease. Blood pressure is stable. BNP 19. Point of care troponin 0.02. Urine drug screen negative. EKG sinus rhythm with sinus arrhythmia. No ischemic change.  Past Medical History  Diagnosis Date  . Obesity   . 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 History of renal calculi   . Cardiomyopathy ([place])     a. 07/2013 Echo: EF 20-25%, diff HK, Gr2 DD, mild MR, mod dil LA/RA.  Marland Kitchen Fracture of left humerus     a. 07/2013.  Marland Kitchen Chronic bronchitis ([place])     "get it most q yr" (12/23/2013)  . Kidney stones   . GERD (gastroesophageal reflux disease)     not at present time  . Heart murmur     "born w/it"   . Obesity   . Chronic systolic dysfunction of left ventricle   . Syncope   . Hypertension   . Type II diabetes mellitus ([place]) 2006  . CHF (congestive heart failure) ([place])   . Shockable heart rhythm detected by automated external defibrillator      Past Surgical History  Procedure Laterality Date  . Cholecystectomy  ~ 2010  . Cardiac catheterization  08/10/13  . Ureteroscopy      "laser for kidney stones"  . Orif humerus fracture Left 08/12/2013    Procedure: OPEN REDUCTION INTERNAL FIXATION (ORIF) HUMERAL SHAFT FRACTURE;  Surgeon: Newt Minion, MD;  Location: [facility];  Service: Orthopedics;  Laterality: Left;  Open Reduction Internal Fixation Left Humerus  . Implantable cardioverter defibrillator implant  12/23/2013    STJ Fortify ICD implanted by [name] for cardiomyopathy and syncope  . Left heart catheterization with coronary angiogram N/A 08/10/2013    Procedure: LEFT HEART CATHETERIZATION WITH CORONARY ANGIOGRAM;  Surgeon: Jettie Booze, MD;  Location: [facility] CATH LAB;  Service: Cardiovascular;  Laterality: N/A;  . Implantable cardioverter defibrillator implant N/A 12/23/2013    Procedure: IMPLANTABLE CARDIOVERTER DEFIBRILLATOR IMPLANT;  Surgeon: Coralyn Mark, MD;  Location: [facility] CATH LAB;  Service: Cardiovascular;  Laterality: N/A;    Allergies  Allergen Reactions  . Bee Venom Anaphylaxis    I have reviewed the patient's current medications . aspirin EC  81 mg Oral Daily  . carvedilol  25 mg Oral BID  . diclofenac sodium  2 g Topical QID  . digoxin  0.125 mg Oral Daily  . enoxaparin (LOVENOX) injection  40 mg Subcutaneous Q24H  . furosemide  20 mg Oral BID  . insulin aspart  0-15 Units Subcutaneous TID WC  . losartan  50 mg Oral Daily  . pantoprazole  40 mg Oral Daily  . potassium chloride SA  20 mEq Oral Daily  . spironolactone  25 mg Oral Daily     acetaminophen **OR** acetaminophen, promethazine  Prior to Admission medications   Medication Sig Start Date End Date Taking? Authorizing Provider  ACCU-CHEK SOFTCLIX LANCETS lancets 1 each by Other route 3 (three) times daily. Patient taking differently: 1 each by Other route See admin instructions. Check blood sugar once weekly 10/20/14  Yes Josalyn Funches, MD  aspirin 81 MG tablet Take 81 mg by mouth daily.   Yes Historical Provider, MD  Blood Glucose Monitoring Suppl (ACCU-CHEK AVIVA PLUS) W/DEVICE KIT 1 Device by Does not apply route 3 (three) times daily after meals. Patient taking differently: 1 Device by Does not apply route See admin instructions. Check blood sugar once weekly 10/20/14  Yes Josalyn Funches, MD  carvedilol (COREG) 25 MG tablet Take 1 tablet (25 mg total) by mouth 2 (two) times daily. 12/29/14  Yes Lelon Perla, MD  digoxin (LANOXIN) 0.125 MG tablet Take 1 tablet (0.125 mg total) by mouth daily. 02/06/15  Yes Lelon Perla, MD  furosemide (LASIX) 20 MG tablet Take 20 mg by mouth 2 (two) times daily.   Yes Historical Provider, MD  glucose blood (ACCU-CHEK AVIVA PLUS) test strip 1 each by Other route 3 (three) times daily. Patient taking differently: 1 each by Other route See admin instructions. Check blood sugar once weekly 10/20/14  Yes Boykin Nearing, MD    Lancet Devices [facility]) lancets 1 each by Other route 3 (three) times daily. Patient taking differently: 1 each by Other route See admin instructions. Check blood sugar once weekly 10/20/14  Yes Josalyn Funches, MD  losartan (COZAAR) 50 MG tablet Take 1 tablet (50 mg total) by mouth daily. 11/20/14  Yes Lelon Perla, MD  metFORMIN (GLUCOPHAGE) 1000 MG tablet Take 1 tablet (1,000 mg total) by mouth 2 (two) times daily with a meal. 12/25/14  Yes Josalyn Funches, MD  potassium chloride SA (K-DUR,KLOR-CON) 20 MEQ tablet Take 1 tablet (20 mEq total) by mouth daily. 11/20/14  Yes Lelon Perla, MD  sitaGLIPtin-metformin (JANUMET) 50-1000 MG tablet Take 1 tablet by mouth 2 (two) times daily with a meal.   Yes Historical Provider, MD  spironolactone (ALDACTONE) 25 MG tablet TAKE ONE TABLET BY MOUTH DAILY Patient taking differently: Take 25 mg by mouth once daily 02/15/15  Yes Lelon Perla, MD  sitaGLIPtin (JANUVIA) 100 MG tablet Take 1 tablet (100 mg total) by mouth daily. 12/25/14   Boykin Nearing, MD     Social History   Social History  . Marital Status: Single    Spouse Name: N/A  . Number of Children: N/A  . Years of Education: N/A   Occupational History  . Not on file.   Social History Main Topics  . Smoking status: Former Smoker -- 1.00 packs/day for 28 years    Types: Cigarettes    Quit date: 06/03/2014  . Smokeless tobacco: Never Used  . Alcohol Use: 0.0 oz/week    0 Standard drinks or equivalent per week     Comment: 12/23/2013 "might drink a couple times/yr"  . Drug Use: Yes    Special: Marijuana  . Sexual Activity: Yes   Other Topics Concern  . Not on file   Social History Narrative    Family Status  Relation Status Death Age  . Father Alive   . Mother Alive   . Brother Alive    Family History  Problem Relation Age of Onset  . Diabetes Father   . Arthritis Father   . Diabetes Mother   . Arthritis Mother   . Hypertension Brother      ROS:   Full 14 point review of systems complete and found to be negative unless listed above.  Physical Exam: Blood pressure 113/69, pulse 88, temperature 97.7 F (36.5 C), temperature source Oral, resp. rate 19, SpO2 97 %.  General: Well developed, well nourished, male in no acute distress Head: Eyes PERRLA, No xanthomas. Normocephalic and atraumatic, oropharynx without edema or exudate.  Lungs: Resp regular and unlabored, CTA. Heart: RRR no s3, s4, or murmurs..   Neck: No carotid bruits. No lymphadenopathy.  No JVD. Abdomen: Bowel sounds present, abdomen soft and non-tender without masses or hernias noted. Msk:  No spine or cva tenderness. No weakness, no joint deformities or effusions. Extremities: No clubbing, cyanosis or edema. DP/PT/Radials 2+ and equal bilaterally. Neuro: Alert and oriented X 3. No focal deficits noted. Psych:  Good affect, responds appropriately Skin: No rashes or lesions noted.  Labs:   Lab Results  Component Value Date   WBC 6.3 04/01/2015   HGB 12.2* 04/01/2015   HCT 36.0* 04/01/2015   MCV 89.8 04/01/2015   PLT 303 04/01/2015   No results for input(s): INR in the last 72 hours.  Recent Labs Lab 04/01/15 1456  NA 136  K 3.4*  CL 97*  CO2 27  BUN 7  CREATININE 0.93  CALCIUM 9.0  GLUCOSE 213*   MAGNESIUM  Date Value Ref Range Status  04/01/2015 1.5* 1.7 - 2.4 mg/dL Final   No results for input(s): CKTOTAL, CKMB, TROPONINI in the last 72 hours.  Recent Labs  04/01/15 1505  TROPIPOC 0.02    Echo: 11/16/14 LV EF: 25% -  30%  ------------------------------------------------------------------- Indications:   Cardiomyopathy (I42.9).  ------------------------------------------------------------------- History:  PMH:  Chest pain. Syncope, dyspnea, and murmur. Risk factors: History of Pericardial Effusion, Fatigue, ICD Implanted (12/2013), Obesity, Non-Ischemic Cardiomyopathy. Former tobacco use. Hypertension. Diabetes  mellitus.  ------------------------------------------------------------------- Study Conclusions  - Left ventricle: The cavity size was normal. Wall thickness was increased in a pattern of moderate LVH. Systolic function was severely reduced. The estimated ejection fraction was in the range of 25% to 30%. Doppler parameters are consistent with abnormal left ventricular relaxation (grade 1 diastolic dysfunction). - Left atrium: The atrium was moderately dilated.   ECG:  Vent.  rate 57 BPM PR interval 188 ms QRS duration 124 ms QT/QTc 392/381 ms P-R-T axes 47 4 97  Radiology:  Dg Chest 2 View  04/01/2015  CLINICAL DATA:  Left-sided chest pain 2 days. EXAM: CHEST  2 VIEW COMPARISON:  11/23/2014 and 09/08/2014 FINDINGS: Left-sided pacemaker unchanged. Lungs are adequately inflated without consolidation or effusion. Borderline stable cardiomegaly. Remainder of the exam is unchanged. IMPRESSION: No active cardiopulmonary disease. Electronically Signed   By: Marin Olp M.D.   On: 04/01/2015 15:45    ASSESSMENT AND PLAN:    Principal Problem:   Chronic chest pain Active Problems:   GERD   DM (diabetes mellitus), type 2, uncontrolled (HCC)   Nonischemic cardiomyopathy ([place])   Chronic systolic dysfunction of left ventricle   ICD (implantable cardioverter-defibrillator) in place  1. Chronic chest pain with hx of NICM s/p ICD - Cardiac catheterization in March of 2015 showed normal coronary arteries s/p Implantation of a STJ single chamber ICD on 12-23-13 by [name].Last Echocardiogram repeated in June 2016 and showed ejection fraction 25-30%, moderate left ventricular hypertrophy, grade 1 diastolic dysfunction and moderate left atrial enlargement. - Last seen by Dr. Stanford [name] 02/06/2015. At that time his CP symptoms seemed out of proportion of physical exam findings,  digoxin added and plan for  referral to the CHF clinic and may benefit from a right heart catheterization.  -  He presented yesterday with worsening chest pain. Chest x-ray without acute cardiopulmonary disease. Blood pressure is stable. BNP 19. Point of care troponin 0.02. Urine drug screen negative. EKG sinus rhythm with sinus arrhythmia. No ischemic change. Will discuss plan with Dr. Martinique. Currently CP free.   2. HTN - stable and well controlled.   3. Hypomagnesia - Will give suppliment  Other wise per IM. Consider lipid panel at some point.    SignedLeanor Kail, PA 04/02/2015, 8:04 AM   Patient seen and examined and history reviewed. Agree with above findings and plan. 49 yo BM presents with symptoms predominantly of fatigue with some dyspnea and chest pain. He has a history of nonischemic CM with EF 25-30%. Cardiac cath in March 2015 showed normal coronary arteries. States pain has been going on for 6 months. Describes this as a fleeting pain lasting seconds. Not related to activity. No clear aggravating or alleviating factors. Notes dyspnea on exertion class 2. Was running 3 miles a day but states Dr. Stanford [name] told him this was too much so now only walking one mile/day. No recent stressors. Notes frequent urination at night due to diuretics so doesn't sleep well. On exam lungs are clear. No edema. No murmur or gallop. CXR is clear. BNP 19.6. There is no evidence of volume overload.  In summary he has atypical chest pain that sounds musculoskeletal. No further work up needed. His CHF is well compensated and he is on optimal medical therapy. It is possible that symptoms of dyspnea/fatigue may be related to low output but I doubt. He is [place] for DC today from our standpoint with continued medical therapy. Will arrange follow up with  Dr. Stanford [name]. May want to consider a CPX study as outpatient to further assess his limitations. I would recommend he take his evening diuretic earlier to avoid diuresis at bedtime.   Peter Martinique, [place] 04/02/2015 8:47 AM

## 2015-04-02 NOTE — Discharge Summary (Signed)
Name: [name] MRN: [id number] DOB: 29-Jun-1965 49 y.o. PCP: Boykin Nearing, MD  Date of Admission: 04/01/2015  3:07 PM Date of Discharge: 04/02/2015 Attending Physician: Axel Filler, MD  Discharge Diagnosis: 1. Atypical chest pain  Principal Problem:   Chronic chest pain Active Problems:   GERD   DM (diabetes mellitus), type 2, uncontrolled (HCC)   Nonischemic cardiomyopathy (HCC)   Chronic systolic dysfunction of left ventricle   ICD (implantable cardioverter-defibrillator) in place   Chronic systolic CHF (congestive heart failure) ([place])  Discharge Medications:   Medication List    STOP taking these medications        sitaGLIPtin-metformin 50-1000 MG tablet  Commonly known as:  JANUMET      TAKE these medications        ACCU-CHEK AVIVA PLUS W/DEVICE Kit  1 Device by Does not apply route 3 (three) times daily after meals.     accu-chek softclix lancets  1 each by Other route 3 (three) times daily.     ACCU-CHEK SOFTCLIX LANCETS lancets  1 each by Other route 3 (three) times daily.     aspirin 81 MG tablet  Take 81 mg by mouth daily.     carvedilol 25 MG tablet  Commonly known as:  COREG  Take 1 tablet (25 mg total) by mouth 2 (two) times daily.     digoxin 0.125 MG tablet  Commonly known as:  LANOXIN  Take 1 tablet (0.125 mg total) by mouth daily.     furosemide 20 MG tablet  Commonly known as:  LASIX  Take 20 mg by mouth 2 (two) times daily.     glucose blood test strip  Commonly known as:  ACCU-CHEK AVIVA PLUS  1 each by Other route 3 (three) times daily.     losartan 50 MG tablet  Commonly known as:  COZAAR  Take 1 tablet (50 mg total) by mouth daily.     metFORMIN 1000 MG tablet  Commonly known as:  GLUCOPHAGE  Take 1 tablet (1,000 mg total) by mouth 2 (two) times daily with a meal.     pantoprazole 40 MG tablet  Commonly known as:  PROTONIX  Take 1 tablet (40 mg total) by mouth daily.     potassium chloride SA 20 MEQ  tablet  Commonly known as:  K-DUR,KLOR-CON  Take 1 tablet (20 mEq total) by mouth daily.     sitaGLIPtin 100 MG tablet  Commonly known as:  JANUVIA  Take 1 tablet (100 mg total) by mouth daily.     spironolactone 25 MG tablet  Commonly known as:  ALDACTONE  TAKE ONE TABLET BY MOUTH DAILY        Disposition and follow-up:   Mr.[name] was [facility] from [facility] in Good condition.  At the hospital follow up visit please address:  1.  Chest pain/shortness of breath symptoms  2.  Labs / imaging needed at time of follow-up: None3  3.  Pending labs/ test needing follow-up: None  Follow-up Appointments:     Follow-up Information    Follow up with [name], MD. Go on 04/10/2015.   Specialty:  Cardiology   Why:  @ 10 AM   Contact information:   [address] [place] [address] [phone]       Follow up with Minerva Ends, MD. Go on 04/16/2015.   Specialty:  Family Medicine   Why:  Follow up @ 3:30pm  Contact information:   [place] [address] [phone]       Discharge Instructions: Discharge Instructions    Diet - low sodium heart healthy    Complete by:  As directed   Please be sure to limit your salt intake to less than 2075m per day.     Discharge instructions    Complete by:  As directed   Please make sure to schedule a follow up appointment with Dr. CStanford Breedand Dr. FAdrian Blackwaterupon your discharge.  You may consider taking your second dose of lasix earlier in the day to prevent your having to get up at night to urinate.     Increase activity slowly    Complete by:  As directed            Consultations: Treatment Team:  Rounding Lbcardiology, MD  Procedures Performed:  Dg Chest 2 View  04/01/2015  CLINICAL DATA:  Left-sided chest pain 2 days. EXAM: CHEST  2 VIEW COMPARISON:  11/23/2014 and 09/08/2014 FINDINGS: Left-sided pacemaker unchanged. Lungs are adequately inflated without  consolidation or effusion. Borderline stable cardiomegaly. Remainder of the exam is unchanged. IMPRESSION: No active cardiopulmonary disease. Electronically Signed   By: DMarin OlpM.D.   On: 04/01/2015 15:45   Admission HPI: [name] a 89yoM with PMH of CHF/NICM (last EF 25-30% in 11/2014) s/p ICD placement (12/2013), morbid obesity, anxiety, DM2, HTN, GERD, and chronic atypical chest pain who presents with worsening of his usual chest pain over the past week, along with a 2-3 month history of progressive fatigue. He describes the pain as a squeezing in his left chest that does not radiate, occurs during both exertion and at rest, and is not relieved by anything in particular. He describes these as pain spells that usually last only seconds to minutes and have been occurring for at least several months so far. He says that his pains have become more frequent and more severe particularly over the past week, but have not changed in quality. He says he also has been more fatigued over the past few months, with dyspnea on exertion but not at rest when he sometimes has the chest pain. He also gets occasionally lightheaded and nauseous when exerting himself and becoming short of breath, but otherwise denies other symptoms, including fevers, changes in weight, blurred vision, palpitations, diaphoresis, cough, abdominal pain, leg swelling or changes in bowel or urinary function. He says that he has been compliant with all of his medications except missing a single dose of lasix a few days ago. He denies sick contacts, recent prolonged immobilization, or recent travel.  Patient is a 30 pack year smoker, who quit approximately 1 year ago. He denies a history of alcohol abuse or any other recreational drug use.  When pressed further, patient says that he has been having increased fatigue and dyspnea on exertion for the past few months, but has had difficulty getting in to see his doctor, so he felt that it would  be best to come to the ED.  Hospital Course by problem list: Principal Problem:   Chronic chest pain Active Problems:   GERD   DM (diabetes mellitus), type 2, uncontrolled (HCC)   Nonischemic cardiomyopathy (HCC)   Chronic systolic dysfunction of left ventricle   ICD (implantable cardioverter-defibrillator) in place   Chronic systolic [place] ([place]) [place]   1. 1. Atypical chest pain w/ h/o NICM s/p ICD placement - patient reports brief spells of  squeezing left sided pain, with progressive fatigue over the past few months. Physical exam is normal, EKG is normal and unchaged from previous exams, with initial troponins negative, BNP normal, and CXR negative. He has an extensive cardiac history - CHF/NICM (last EF 25-30% in 11/2014) s/p ICD placement (12/2013) with well-documented atypical chest pain. LHC 08/2013 showed normal coronary arteries. Symptoms possibly from GERD vs MSK vs anxiety exacerbating symptoms, highly unlikely current symptoms are CV/ischemic in nature. Repeat EKG and troponins were negative, cardiology was consulted with recs for no further workup. Patient was given maalox, a trial of PPI (previously h/o GERD, not on medication), and voltaren gel which improved his symptoms inpatient.  Discharge Vitals:   BP 118/66 mmHg  Pulse 64  Temp(Src) 97.7 F (36.5 C) (Oral)  Resp 19  SpO2 97%  Discharge Labs:  Results for orders placed or performed during the hospital encounter of 04/01/15 (from the past 24 hour(s))  Basic metabolic panel     Status: Abnormal   Collection Time: 04/01/15  2:56 PM  Result Value Ref Range   Sodium 136 135 - 145 mmol/L   Potassium 3.4 (L) 3.5 - 5.1 mmol/L   Chloride 97 (L) 101 - 111 mmol/L   CO2 27 22 - 32 mmol/L   Glucose, Bld 213 (H) 65 - 99 mg/dL   BUN 7 6 - 20 mg/dL   Creatinine, Ser 0.93 0.61 - 1.24 mg/dL   Calcium 9.0 8.9 - 10.3 mg/dL   GFR calc non Af Amer >60 >60 mL/min   GFR calc Af Amer >60 >60 mL/min   Anion gap 12 5 -  15  CBC     Status: Abnormal   Collection Time: 04/01/15  2:56 PM  Result Value Ref Range   WBC 6.3 4.0 - 10.5 K/uL   RBC 4.01 (L) 4.22 - 5.81 MIL/uL   Hemoglobin 12.2 (L) 13.0 - 17.0 g/dL   HCT 36.0 (L) 39.0 - 52.0 %   MCV 89.8 78.0 - 100.0 fL   MCH 30.4 26.0 - 34.0 pg   MCHC 33.9 30.0 - 36.0 g/dL   RDW 14.1 11.5 - 15.5 %   Platelets 303 150 - 400 K/uL  Brain natriuretic peptide     Status: None   Collection Time: 04/01/15  2:56 PM  Result Value Ref Range   B Natriuretic Peptide 19.6 0.0 - 100.0 pg/mL  I-stat troponin, ED     Status: None   Collection Time: 04/01/15  3:05 PM  Result Value Ref Range   Troponin i, poc 0.02 0.00 - 0.08 ng/mL   Comment 3          Urine rapid drug screen (hosp performed)     Status: None   Collection Time: 04/01/15  4:49 PM  Result Value Ref Range   Opiates NONE DETECTED NONE DETECTED   Cocaine NONE DETECTED NONE DETECTED   Benzodiazepines NONE DETECTED NONE DETECTED   Amphetamines NONE DETECTED NONE DETECTED   Tetrahydrocannabinol NONE DETECTED NONE DETECTED   Barbiturates NONE DETECTED NONE DETECTED  Magnesium     Status: Abnormal   Collection Time: 04/01/15  6:55 PM  Result Value Ref Range   Magnesium 1.5 (L) 1.7 - 2.4 mg/dL  Hemoglobin A1c     Status: Abnormal   Collection Time: 04/01/15  8:30 PM  Result Value Ref Range   Hgb A1c MFr Bld 10.2 (H) 4.8 - 5.6 %   Mean Plasma Glucose 246 mg/dL  Glucose, capillary  Status: Abnormal   Collection Time: 04/01/15  9:23 PM  Result Value Ref Range   Glucose-Capillary 298 (H) 65 - 99 mg/dL  Troponin I     Status: None   Collection Time: 04/02/15  3:21 AM  Result Value Ref Range   Troponin I <0.03 <0.031 ng/mL  Glucose, capillary     Status: Abnormal   Collection Time: 04/02/15  7:29 AM  Result Value Ref Range   Glucose-Capillary 214 (H) 65 - 99 mg/dL   Comment 1 Notify RN    Comment 2 Document in Chart     Signed: Norval Gable, MD 04/02/2015, 11:29 AM

## 2015-04-09 NOTE — Progress Notes (Signed)
HPI: FU CM/CHF. Echo 2/15 revealed EF of 20-25% with diffuse hypokinesis and biatrial enlargement, mild MR and a small pericardial effusion. TSH in February 2015 normal. Cardiac catheterization in March of 2015 showed normal coronary arteries. Echocardiogram repeated in June 2016 and showed ejection fraction 25-30%, moderate left ventricular hypertrophy, grade 1 diastolic dysfunction and moderate left atrial enlargement. BNP in June 2016-36. Seen in the emergency room October 2016 with chest pain. Troponin negative. BNP 19.6. Hemoglobin 12.2. Since last seen, He continues to have occasional chest pain. It is in the left chest and described as a shooting pain. Occasional pressure. He notes dyspnea on exertion and at rest. No pedal edema or syncope.  Current Outpatient Prescriptions  Medication Sig Dispense Refill  . ACCU-CHEK SOFTCLIX LANCETS lancets 1 each by Other route 3 (three) times daily. (Patient taking differently: 1 each by Other route See admin instructions. Check blood sugar once weekly) 100 each 12  . aspirin 81 MG tablet Take 81 mg by mouth daily.    . Blood Glucose Monitoring Suppl (ACCU-CHEK AVIVA PLUS) W/DEVICE KIT 1 Device by Does not apply route 3 (three) times daily after meals. (Patient taking differently: 1 Device by Does not apply route See admin instructions. Check blood sugar once weekly) 1 kit 0  . carvedilol (COREG) 25 MG tablet Take 1 tablet (25 mg total) by mouth 2 (two) times daily. 180 tablet 3  . digoxin (LANOXIN) 0.125 MG tablet Take 1 tablet (0.125 mg total) by mouth daily. 90 tablet 3  . furosemide (LASIX) 20 MG tablet Take 20 mg by mouth 2 (two) times daily.    Marland Kitchen glucose blood (ACCU-CHEK AVIVA PLUS) test strip 1 each by Other route 3 (three) times daily. (Patient taking differently: 1 each by Other route See admin instructions. Check blood sugar once weekly) 100 each 12  . Lancet Devices (ACCU-CHEK SOFTCLIX) lancets 1 each by Other route 3 (three) times daily.  (Patient taking differently: 1 each by Other route See admin instructions. Check blood sugar once weekly) 1 each 0  . losartan (COZAAR) 50 MG tablet Take 1 tablet (50 mg total) by mouth daily. 90 tablet 3  . metFORMIN (GLUCOPHAGE) 1000 MG tablet Take 1 tablet (1,000 mg total) by mouth 2 (two) times daily with a meal. 180 tablet 3  . pantoprazole (PROTONIX) 40 MG tablet Take 1 tablet (40 mg total) by mouth daily. 30 tablet 0  . potassium chloride SA (K-DUR,KLOR-CON) 20 MEQ tablet Take 1 tablet (20 mEq total) by mouth daily. 90 tablet 3  . sitaGLIPtin (JANUVIA) 100 MG tablet Take 1 tablet (100 mg total) by mouth daily. 90 tablet 3  . spironolactone (ALDACTONE) 25 MG tablet TAKE ONE TABLET BY MOUTH DAILY (Patient taking differently: Take 25 mg by mouth once daily) 90 tablet 2   No current facility-administered medications for this visit.     Past Medical History  Diagnosis Date  . Obesity   . 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 History of renal calculi   . Cardiomyopathy ([place])     a. 07/2013 Echo: EF 20-25%, diff HK, Gr2 DD, mild MR, mod dil LA/RA.  Marland Kitchen Fracture of left humerus     a. 07/2013.  Marland Kitchen Chronic bronchitis ([place])     "get it most q yr" (12/23/2013)  . Kidney stones   . GERD (gastroesophageal reflux disease)     not at present time  . Heart murmur     "  born w/it"   . Obesity   . Chronic systolic dysfunction of left ventricle   . Syncope   . Hypertension   . Type II diabetes mellitus ([place]) 2006  . CHF (congestive heart failure) ([place])   . 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 Cholecystectomy  ~ 2010  . Cardiac catheterization  08/10/13  . Ureteroscopy      "laser for kidney stones"  . Orif humerus fracture Left 08/12/2013    Procedure: OPEN REDUCTION INTERNAL FIXATION (ORIF) HUMERAL SHAFT FRACTURE;  Surgeon: Newt Minion, MD;  Location: [facility];  Service: Orthopedics;  Laterality: Left;  Open Reduction Internal Fixation Left Humerus  . Implantable cardioverter defibrillator implant  12/23/2013    STJ Fortify ICD implanted by [name] for cardiomyopathy and syncope  . Left heart catheterization with coronary angiogram N/A 08/10/2013    Procedure: LEFT HEART CATHETERIZATION WITH CORONARY ANGIOGRAM;  Surgeon: Jettie Booze, MD;  [facility] C[facility];  Service: Cardiovascular;  Laterality: N/A;  . Implantable cardioverter defibrillator implant N/A 12/23/2013    Procedure: IMPLANTABLE CARDIOVERTER DEFIBRILLATOR IMPLANT;  Surgeon: Coralyn Mark, MD;  Location: [facility] CATH LAB;  Service: Cardiovascular;  Laterality: N/A;    Social History   Social History  . Marital Status: Single    Spouse Name: N/A  . Number of Children: N/A  . Years of Education: N/A   Occupational History  . Not on file.   Social History Main Topics  . Smoking status: Former Smoker -- 1.00 packs/day for 28 years    Types: Cigarettes    Quit date: 06/03/2014  . Smokeless tobacco: Never Used  . Alcohol Use: 0.0 oz/week    0 Standard drinks or equivalent per week     Comment: 12/23/2013 "might drink a couple times/yr"  . Drug Use: Yes    Special: Marijuana  . Sexual Activity: Yes   Other Topics Concern  . Not on file   Social History Narrative    ROS: no fevers or chills, productive cough, hemoptysis, dysphasia, odynophagia, melena, hematochezia, dysuria, hematuria, rash, seizure activity, orthopnea, PND, pedal edema, claudication. Remaining systems are negative.  Physical Exam: Well-developed well-nourished in no acute distress.  Skin is warm and dry.  HEENT is normal.  Neck is supple.  Chest is clear to auscultation with normal expansion.  Cardiovascular exam is regular rate and rhythm.  Abdominal exam nontender or distended. No masses palpated. Extremities show no edema. neuro grossly intact

## 2015-04-10 ENCOUNTER — Telehealth: Payer: Self-pay

## 2015-04-10 ENCOUNTER — Encounter: Payer: Self-pay | Admitting: Cardiology

## 2015-04-10 ENCOUNTER — Ambulatory Visit (INDEPENDENT_AMBULATORY_CARE_PROVIDER_SITE_OTHER): Payer: Medicaid Other | Admitting: Cardiology

## 2015-04-10 VITALS — BP 120/76 | HR 74 | Ht 72.0 in | Wt 260.0 lb

## 2015-04-10 DIAGNOSIS — R079 Chest pain, unspecified: Secondary | ICD-10-CM | POA: Diagnosis not present

## 2015-04-10 DIAGNOSIS — I519 Heart disease, unspecified: Secondary | ICD-10-CM | POA: Diagnosis not present

## 2015-04-10 DIAGNOSIS — I429 Cardiomyopathy, unspecified: Secondary | ICD-10-CM

## 2015-04-10 DIAGNOSIS — Z95810 Presence of automatic (implantable) cardiac defibrillator: Secondary | ICD-10-CM | POA: Diagnosis not present

## 2015-04-10 DIAGNOSIS — G8929 Other chronic pain: Secondary | ICD-10-CM

## 2015-04-10 DIAGNOSIS — I428 Other cardiomyopathies: Secondary | ICD-10-CM

## 2015-04-10 MED ORDER — SACUBITRIL-VALSARTAN 24-26 MG PO TABS: 1.0000 | ORAL_TABLET | Freq: Two times a day (BID) | ORAL | Status: DC

## 2015-04-10 NOTE — Assessment & Plan Note (Signed)
Followed by electrophysiology. 

## 2015-04-10 NOTE — Assessment & Plan Note (Addendum)
Continue present dose of diuretics. Patient euvolemic on examination. Check potassium and renal function. Symptoms out of proportion to findings on physical examination. Check BNP. I discussed the importance of weighing himself daily and low sodium diet. We will consider referral to CHF clinic in the future if needed.

## 2015-04-10 NOTE — Telephone Encounter (Signed)
Prior auth for [name]  24-26 sent to [facility] via fax.

## 2015-04-10 NOTE — Assessment & Plan Note (Addendum)
Symptoms atypical and unchanged. Enzymes negative; no plans for further ischemia evaluation.

## 2015-04-10 NOTE — Assessment & Plan Note (Addendum)
Plan discontinue Cozaar. Begin entresto in 36 hours 24/26 mg BID; check potassium and renal function in 1 week. We will plan to titrate this as tolerated by blood pressure. Continue beta blocker.

## 2015-04-10 NOTE — Patient Instructions (Signed)
Medication Instructions:   STOP LOSARTAN  AFTER 36 HOURS OFF LOSARTAN START ENTRESTO 24/26 MG ONE TABLET TWICE DAILY  Labwork:  Your physician recommends that you return for lab work in: ONE WEEK  Follow-Up:  Your physician recommends that you schedule a follow-up appointment in: AS SCHEDULED   If you need a refill on your cardiac medications before your next appointment, please call your pharmacy.

## 2015-04-12 ENCOUNTER — Telehealth: Payer: Self-pay | Admitting: Cardiology

## 2015-04-12 ENCOUNTER — Ambulatory Visit (INDEPENDENT_AMBULATORY_CARE_PROVIDER_SITE_OTHER): Payer: Medicaid Other

## 2015-04-12 DIAGNOSIS — Z95810 Presence of automatic (implantable) cardiac defibrillator: Secondary | ICD-10-CM

## 2015-04-12 DIAGNOSIS — I5022 Chronic systolic (congestive) heart failure: Secondary | ICD-10-CM | POA: Diagnosis not present

## 2015-04-12 NOTE — Telephone Encounter (Signed)
Spoke with pt and reminded pt of remote transmission that is due today. Pt verbalized understanding.   

## 2015-04-13 ENCOUNTER — Telehealth: Payer: Self-pay

## 2015-04-13 NOTE — Progress Notes (Signed)
EPIC Encounter for ICM Monitoring  Patient Name: [name] is a 49 y.o. male Date: 04/13/2015 Primary Care Physican: Minerva Ends, MD Primary Cardiologist: Stanford Breed Electrophysiologist: Allred Dry Weight: 255 lb       In the past month, have you:  1. Gained more than 2 pounds in a day or more than 5 pounds in a week? no  2. Had changes in your medications (with verification of current medications)? no  3. Had more shortness of breath than is usual for you? no  4. Limited your activity because of shortness of breath? no  5. Not been able to sleep because of shortness of breath? no  6. Had increased swelling in your feet or ankles? no  7. Had symptoms of dehydration (dizziness, dry mouth, increased thirst, decreased urine output) no  8. Had changes in sodium restriction? no  9. Been compliant with medication? Yes   ICM trend: 04/12/2015   Follow-up plan: ICM clinic phone appointment on 05/15/2015.  Corvue impedance trending above baseline ~04/05/2015 to 04/11/2015 and back to baseline 04/12/2015 suggesting dryness during that time.  He reported he is drinking enough fluids and has not been sick.  He was in ED on 04/02/2015 for chest pain and has followed up with Dr Stanford Breed.  He has appointment with PCP on 04/16/2015 and [name] on 04/23/2015.  He reported he does feel tired a lot.   No changes today.    Copy of note sent to patient's primary care physician, primary cardiologist, and device following physician.  [name], RN, CCM 04/13/2015 9:29 AM

## 2015-04-13 NOTE — Telephone Encounter (Signed)
Per [employer], Entresto denied, due to "not meeting medical criteria. I called them back to provide additional info that they required. Will have an answer in 24 hours. Patient has samples he is taking. Will let him know Monday.

## 2015-04-16 ENCOUNTER — Encounter: Payer: Self-pay | Admitting: Family Medicine

## 2015-04-16 ENCOUNTER — Ambulatory Visit: Payer: Medicaid Other | Attending: Family Medicine | Admitting: Family Medicine

## 2015-04-16 ENCOUNTER — Encounter (HOSPITAL_BASED_OUTPATIENT_CLINIC_OR_DEPARTMENT_OTHER): Payer: Medicaid Other | Admitting: Clinical

## 2015-04-16 VITALS — BP 115/70 | HR 79 | Temp 98.5°F | Resp 16 | Ht 73.0 in | Wt 263.0 lb

## 2015-04-16 DIAGNOSIS — Z7984 Long term (current) use of oral hypoglycemic drugs: Secondary | ICD-10-CM | POA: Insufficient documentation

## 2015-04-16 DIAGNOSIS — IMO0001 Reserved for inherently not codable concepts without codable children: Secondary | ICD-10-CM

## 2015-04-16 DIAGNOSIS — G5612 Other lesions of median nerve, left upper limb: Secondary | ICD-10-CM | POA: Diagnosis not present

## 2015-04-16 DIAGNOSIS — E119 Type 2 diabetes mellitus without complications: Secondary | ICD-10-CM | POA: Diagnosis present

## 2015-04-16 DIAGNOSIS — Z79899 Other long term (current) drug therapy: Secondary | ICD-10-CM | POA: Insufficient documentation

## 2015-04-16 DIAGNOSIS — G5611 Other lesions of median nerve, right upper limb: Secondary | ICD-10-CM | POA: Diagnosis not present

## 2015-04-16 DIAGNOSIS — E1165 Type 2 diabetes mellitus with hyperglycemia: Secondary | ICD-10-CM | POA: Diagnosis not present

## 2015-04-16 DIAGNOSIS — Z87891 Personal history of nicotine dependence: Secondary | ICD-10-CM | POA: Insufficient documentation

## 2015-04-16 DIAGNOSIS — Z7982 Long term (current) use of aspirin: Secondary | ICD-10-CM | POA: Insufficient documentation

## 2015-04-16 DIAGNOSIS — F4323 Adjustment disorder with mixed anxiety and depressed mood: Secondary | ICD-10-CM

## 2015-04-16 LAB — GLUCOSE, POCT (MANUAL RESULT ENTRY): POC Glucose: 252 mg/dl — AB (ref 70–99)

## 2015-04-16 MED ORDER — GABAPENTIN 300 MG PO CAPS: 300.0000 mg | ORAL_CAPSULE | Freq: Every day | ORAL | Status: DC

## 2015-04-16 NOTE — Progress Notes (Signed)
Patient ID: [name], male   DOB: 1966-05-03, 49 y.o.   MRN: [id number]   Subjective:  Patient ID: [name], male    DOB: Nov 25, 1965  Age: 49 y.o. MRN: [id number]  CC: Hospitalization Follow-up   HPI [name] presents for   1. Chest pain: hospitalized overnight from 10/23-10/24/16. On day of admission extreme fatigue with chest pain. Atypical CP. Has chronic CP. Trop was negative in ED. CXR normal.   2. Diabetes: A1c is dow-trending. Now 10.2 from peak of 13.9. Patient is not compliant with low carb diet. He is compliant with metformin and [place]. Added [place] last week.   3. R arm pain: acute since IV placement during recent hospitalization. Pain radiates to 2nd and 3rd finger. With slight contracture.   4. L arm pain: chronic. Since surgery to L bicep in 2015. Contracture and weakness of R hand. No skin changes. Patient is R handed.   5. Darkness between toes: patient is vigilant about foot care. Noticing "bruising" between 5th and 4th and 4th and 3rd toes on both feet. No pain. No ulcers.   Social History  Substance Use Topics  . Smoking status: Former Smoker -- 1.00 packs/day for 28 years    Types: Cigarettes    Quit date: 06/03/2014  . Smokeless tobacco: Never Used  . Alcohol Use: 0.0 oz/week    0 Standard drinks or equivalent per week     Comment: 12/23/2013 "might drink a couple times/yr"    Outpatient Prescriptions Prior to Visit  Medication Sig Dispense Refill  . aspirin 81 MG tablet Take 81 mg by mouth daily.    . Blood Glucose Monitoring Suppl (ACCU-CHEK AVIVA PLUS) W/DEVICE KIT 1 Device by Does not apply route 3 (three) times daily after meals. (Patient taking differently: 1 Device by Does not apply route See admin instructions. Check blood sugar once weekly) 1 kit 0  . carvedilol (COREG) 25 MG tablet Take 1 tablet (25 mg total) by mouth 2 (two) times daily. 180 tablet 3  . digoxin (LANOXIN) 0.125 MG tablet Take 1 tablet (0.125 mg total) by mouth  daily. 90 tablet 3  . furosemide (LASIX) 20 MG tablet Take 20 mg by mouth 2 (two) times daily.    Marland Kitchen glucose blood (ACCU-CHEK AVIVA PLUS) test strip 1 each by Other route 3 (three) times daily. (Patient taking differently: 1 each by Other route See admin instructions. Check blood sugar once weekly) 100 each 12  . Lancet Devices (ACCU-CHEK SOFTCLIX) lancets 1 each by Other route 3 (three) times daily. (Patient taking differently: 1 each by Other route See admin instructions. Check blood sugar once weekly) 1 each 0  . metFORMIN (GLUCOPHAGE) 1000 MG tablet Take 1 tablet (1,000 mg total) by mouth 2 (two) times daily with a meal. 180 tablet 3  . pantoprazole (PROTONIX) 40 MG tablet Take 1 tablet (40 mg total) by mouth daily. 30 tablet 0  . potassium chloride SA (K-DUR,KLOR-CON) 20 MEQ tablet Take 1 tablet (20 mEq total) by mouth daily. 90 tablet 3  . sacubitril-valsartan (ENTRESTO) 24-26 MG Take 1 tablet by mouth 2 (two) times daily. 60 tablet 6  . sitaGLIPtin (JANUVIA) 100 MG tablet Take 1 tablet (100 mg total) by mouth daily. 90 tablet 3  . spironolactone (ALDACTONE) 25 MG tablet TAKE ONE TABLET BY MOUTH DAILY (Patient taking differently: Take 25 mg by mouth once daily) 90 tablet 2  . ACCU-CHEK SOFTCLIX LANCETS lancets 1 each by Other route 3 (three)  times daily. (Patient taking differently: 1 each by Other route See admin instructions. Check blood sugar once weekly) 100 each 12   No facility-administered medications prior to visit.    ROS Review of Systems  Constitutional: Positive for fatigue. Negative for fever, chills and unexpected weight change.  Eyes: Negative for visual disturbance.  Respiratory: Negative for cough and shortness of breath.   Cardiovascular: Positive for chest pain. Negative for palpitations and leg swelling.  Gastrointestinal: Negative for nausea, vomiting, abdominal pain, diarrhea, constipation and blood in stool.  Endocrine: Negative for polydipsia, polyphagia and  polyuria.  Musculoskeletal: Positive for arthralgias. Negative for myalgias, back pain, gait problem and neck pain.  Skin: Positive for color change. Negative for rash.  Allergic/Immunologic: Negative for immunocompromised state.  Neurological: Positive for numbness.  Hematological: Negative for adenopathy. Does not bruise/bleed easily.  Psychiatric/Behavioral: Positive for sleep disturbance. Negative for suicidal ideas and dysphoric mood. The patient is nervous/anxious.     Objective:  BP 115/70 mmHg  Pulse 79  Temp(Src) 98.5 F (36.9 C) (Oral)  Resp 16  Ht _0  (1.854 m)  Wt 263 lb (119.296 kg)  BMI 34.71 kg/m2  SpO2 97%  BP/Weight 04/16/2015 04/10/2015 96/43/8381  Systolic BP 840 375 436  Diastolic BP 70 76 66  Wt. (Lbs) 263 260 -  BMI 34.71 35.25 -    Physical Exam  Constitutional: He appears well-developed and well-nourished. No distress.  HENT:  Head: Normocephalic and atraumatic.  Neck: Normal range of motion. Neck supple.  Cardiovascular: Normal rate, regular rhythm, normal heart sounds and intact distal pulses.   Pulmonary/Chest: Effort normal and breath sounds normal.  Musculoskeletal: He exhibits no edema.  Slight inversion of feet With darkness between toes of 5th and 4th and 4th and 3rd toes   Neurological: He is alert.  Skin: Skin is warm and dry. No rash noted. No erythema.  Psychiatric: He has a normal mood and affect.    CBG 252 Lab Results  Component Value Date   HGBA1C 10.2* 04/01/2015    Assessment & Plan:   Problem List Items Addressed This Visit    DM (diabetes mellitus), type 2, uncontrolled ([place]) - Primary (Chronic)    A: slow improvement in A1c. 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    Follow-up: No Follow-up on file.   Boykin Nearing MD

## 2015-04-16 NOTE — Progress Notes (Signed)
HFU chest pain  Stated Rt arm pain after IV at the hospital. Pain like pin and needle feeling  Lt arm feeling heavy, unable to move fingers  Pain scale # 8 No tobacco user  Elevated glucose today at OV. Stated had spaghetti for lunch  Medication taken at 11

## 2015-04-16 NOTE — Assessment & Plan Note (Signed)
This is chronic Plan: PT Gabapentin  L hand/wrist brace

## 2015-04-16 NOTE — Assessment & Plan Note (Signed)
This is acute Expected to resolve  L hand/wrist brace Gabapentin

## 2015-04-16 NOTE — Assessment & Plan Note (Signed)
A: slow improvement in A1c. Patient declines injectable treatment  P: Continue metformin and [place]  Order for diabetic shoes and inserts

## 2015-04-16 NOTE — Patient Instructions (Signed)
[name] was seen today for hospitalization follow-up.  Diagnoses and all orders for this visit:  Uncontrolled diabetes mellitus type 2 without complications, unspecified long term insulin use status (HCC) -     POCT glucose (manual entry)  Left median nerve neuropathy -     gabapentin (NEURONTIN) 300 MG capsule; Take 1 capsule (300 mg total) by mouth at bedtime. -     Ambulatory referral to Physical Therapy  Lesion of right median nerve at forearm -     gabapentin (NEURONTIN) 300 MG capsule; Take 1 capsule (300 mg total) by mouth at bedtime. -     Ambulatory referral to Physical Therapy

## 2015-04-16 NOTE — Progress Notes (Signed)
ASSESSMENT: Pt currently experiencing adjustment reaction with anxiety and depression. Pt needs to f/u with PCP and [employer]; would benefit from psychoeducation and supportive counseling regarding coping with symptoms of anxiety and depression.  Stage of Change: contemplative  PLAN: 1. F/U with behavioral health consultant in at next PCP visit 2. Psychiatric Medications: none. 3. Behavioral recommendation(s):   -Continue daily walks and checking on neighbor -Consider attending group meetings at church and/or time w friends -Consider reading educational material regarding coping with symptoms of anxiety and depression SUBJECTIVE: Pt. referred by Dr Adrian Blackwater for symptoms of anxiety and depression:  Pt. reports the following symptoms/concerns: Pt states that he has had many changes with his health and his dog passed away a year ago; he used to go to church and had more friends, was more social, but his low energy and motivation keeps him from doing the things he used to enjoy. Pt says that he walks daily to check on an elderly neighbor, and that has helped since losing his dog.  Duration of problem: one year Severity: moderate  OBJECTIVE: Orientation & Cognition: Oriented x3. Thought processes normal and appropriate to situation. Mood: appropriate Affect: appropriate Appearance: appropriate Risk of harm to self or others: no known risk of harm to self or others Substance use: none Assessments administered: PHQ9: 11/ GAD7: 8  Diagnosis: Adjustment reaction with anxiety and depression CPT Code: F43.23 -------------------------------------------- Other(s) present in the room: none  Time spent with patient in exam room: 25 minutes

## 2015-04-19 ENCOUNTER — Encounter: Payer: Self-pay | Admitting: *Deleted

## 2015-04-23 ENCOUNTER — Encounter: Payer: Self-pay | Admitting: Internal Medicine

## 2015-04-23 ENCOUNTER — Ambulatory Visit (INDEPENDENT_AMBULATORY_CARE_PROVIDER_SITE_OTHER): Payer: Medicaid Other | Admitting: Internal Medicine

## 2015-04-23 VITALS — BP 124/80 | HR 78 | Ht 73.0 in | Wt 266.2 lb

## 2015-04-23 DIAGNOSIS — E669 Obesity, unspecified: Secondary | ICD-10-CM

## 2015-04-23 DIAGNOSIS — I429 Cardiomyopathy, unspecified: Secondary | ICD-10-CM | POA: Diagnosis not present

## 2015-04-23 DIAGNOSIS — I5022 Chronic systolic (congestive) heart failure: Secondary | ICD-10-CM | POA: Diagnosis not present

## 2015-04-23 DIAGNOSIS — I519 Heart disease, unspecified: Secondary | ICD-10-CM | POA: Diagnosis not present

## 2015-04-23 DIAGNOSIS — I4729 Other ventricular tachycardia: Secondary | ICD-10-CM

## 2015-04-23 DIAGNOSIS — I428 Other cardiomyopathies: Secondary | ICD-10-CM

## 2015-04-23 DIAGNOSIS — I472 Ventricular tachycardia: Secondary | ICD-10-CM | POA: Diagnosis not present

## 2015-04-23 LAB — CUP PACEART INCLINIC DEVICE CHECK
Battery Remaining Longevity: 93.6
Brady Statistic RV Percent Paced: 0 %
Date Time Interrogation Session: 20161114173157
HIGH POWER IMPEDANCE MEASURED VALUE: 70.875
Implantable Lead Location: 753860
Lead Channel Setting Pacing Amplitude: 2.5 V
Lead Channel Setting Pacing Pulse Width: 0.5 ms
MDC IDC LEAD IMPLANT DT: 20150717
MDC IDC MSMT LEADCHNL RV IMPEDANCE VALUE: 387.5 Ohm
MDC IDC MSMT LEADCHNL RV PACING THRESHOLD AMPLITUDE: 0.75 V
MDC IDC MSMT LEADCHNL RV PACING THRESHOLD PULSEWIDTH: 0.5 ms
MDC IDC MSMT LEADCHNL RV SENSING INTR AMPL: 12 mV
MDC IDC SET LEADCHNL RV SENSING SENSITIVITY: 0.5 mV
Pulse Gen Serial Number: 7196009

## 2015-04-23 NOTE — Progress Notes (Signed)
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  He is doing ok.  Continues to have occasional SOB.  Today, he denies symptoms of palpitations, exertional chest pain, shortness of breath,  lower extremity edema, dizziness, presyncope, syncope, or ICD shocks.   The patient is otherwise without complaint today.   Past Medical History  Diagnosis Date  . Obesity   . 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 History of renal calculi   . Cardiomyopathy ([place])     a. 07/2013 Echo: EF 20-25%, diff HK, Gr2 DD, mild MR, mod dil LA/RA.  Marland Kitchen Fracture of left humerus     a. 07/2013.  Marland Kitchen Chronic bronchitis ([place])     "get it most q yr" (12/23/2013)  . Kidney stones   . GERD (gastroesophageal reflux disease)     not at present time  . Heart murmur     "born w/it"   . Obesity   . Chronic systolic dysfunction of left ventricle   . Syncope   . Hypertension   . Type II diabetes mellitus ([place]) 2006  . CHF (congestive heart failure) ([place])   . 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 Cholecystectomy  ~ 2010  . Cardiac catheterization  08/10/13  . Ureteroscopy      "laser for kidney stones"  . Orif humerus fracture Left 08/12/2013    Procedure: OPEN REDUCTION INTERNAL FIXATION (ORIF) HUMERAL SHAFT FRACTURE;  Surgeon: Newt Minion, MD;  Location: [place];  Service: Orthopedics;  Laterality: Left;  Open Reduction Internal Fixation Left Humerus  . Implantable cardioverter defibrillator implant  12/23/2013    STJ Fortify ICD implanted by [name] for cardiomyopathy and syncope  . Left heart catheterization with coronary angiogram N/A 08/10/2013    Procedure: LEFT HEART CATHETERIZATION WITH CORONARY ANGIOGRAM;  Surgeon: Jettie Booze, MD;  Location: [facility] CATH LAB;  Service:  Cardiovascular;  Laterality: N/A;  . Implantable cardioverter defibrillator implant N/A 12/23/2013    Procedure: IMPLANTABLE CARDIOVERTER DEFIBRILLATOR IMPLANT;  Surgeon: Coralyn Mark, MD;  Location: [facility] CATH LAB;  Service: Cardiovascular;  Laterality: N/A;    ROS- all systems are reviewed and negative except as per HPI above  Current Outpatient Prescriptions  Medication Sig Dispense Refill  . ACCU-CHEK SOFTCLIX LANCETS lancets 1 each by Other route 3 (three) times daily. (Patient taking differently: 1 each by Other route See admin instructions. Check blood sugar once weekly) 100 each 12  . aspirin 81 MG tablet Take 81 mg by mouth daily.    . Blood Glucose Monitoring Suppl (ACCU-CHEK AVIVA PLUS) W/DEVICE KIT 1 Device by Does not apply route 3 (three) times daily after meals. (Patient taking differently: 1 Device by Does not apply route See admin instructions. Check blood sugar once weekly) 1 kit 0  . carvedilol (COREG) 25 MG tablet Take 1 tablet (25 mg total) by mouth 2 (two) times daily. 180 tablet 3  . digoxin (LANOXIN) 0.125 MG tablet Take 1 tablet (0.125 mg total) by mouth daily. 90 tablet 3  . furosemide (LASIX) 20 MG tablet Take 20 mg by mouth 2 (two) times daily.    Marland Kitchen gabapentin (NEURONTIN) 300 MG capsule Take 1 capsule (300 mg total) by mouth at bedtime. 30 capsule 3  . glucose blood (ACCU-CHEK AVIVA PLUS)  test strip 1 each by Other route 3 (three) times daily. (Patient taking differently: 1 each by Other route See admin instructions. Check blood sugar once weekly) 100 each 12  . Lancet Devices (ACCU-CHEK SOFTCLIX) lancets 1 each by Other route 3 (three) times daily. (Patient taking differently: 1 each by Other route See admin instructions. Check blood sugar once weekly) 1 each 0  . metFORMIN (GLUCOPHAGE) 1000 MG tablet Take 1 tablet (1,000 mg total) by mouth 2 (two) times daily with a meal. 180 tablet 3  . pantoprazole (PROTONIX) 40 MG tablet Take 1 tablet (40 mg total) by mouth daily. 30  tablet 0  . potassium chloride SA (K-DUR,KLOR-CON) 20 MEQ tablet Take 1 tablet (20 mEq total) by mouth daily. 90 tablet 3  . sacubitril-valsartan (ENTRESTO) 24-26 MG Take 1 tablet by mouth 2 (two) times daily.    . sitaGLIPtin (JANUVIA) 100 MG tablet Take 1 tablet (100 mg total) by mouth daily. 90 tablet 3  . spironolactone (ALDACTONE) 25 MG tablet TAKE ONE TABLET BY MOUTH DAILY (Patient taking differently: Take 25 mg by mouth once daily) 90 tablet 2   No current facility-administered medications for this visit.    Physical Exam: Filed Vitals:   04/23/15 1603  BP: 124/80  Pulse: 78  Height: 6' 1"  (1.854 m)  Weight: 266 lb 3.2 oz (120.748 kg)    GEN- The patient is well appearing, alert and oriented x 3 today.   Head- normocephalic, atraumatic Eyes-  Sclera clear, conjunctiva pink Ears- hearing intact Oropharynx- clear Lungs- Clear to ausculation bilaterally, normal work of breathing Chest- ICD pocket is well healed Heart- Regular rate and rhythm, no murmurs, rubs or gallops, PMI not laterally displaced GI- soft, NT, ND, + BS Extremities- no clubbing, cyanosis, or edema  ICD interrogation- reviewed in detail today,  See PACEART report  Assessment and Plan:  1.  Acute on chronic systolic dysfunction Appears mildly volume overloaded today and this correlates with coreview.  2 gram sodium restriction advised Normal ICD function See Pace Art report No changes today Followed in ICM device clinic Check bmet, bnp today He says that medicaid denies payment for his entresto.  I have spoken with Dr Stanford Breed who will have his team work on financial assistance program for this medicine.  I have given samples today to hold him over until he sees Dr Stanford Breed again.  2. Atypical chest pain improved  3. NSVT/ h/o Syncope Observed on ICD interrogation today Consider increasing coreg in the future if BP allows or he has sustained VT No changes today  I have discussed SJM Fortify Assura  advisary with the patient today. He understands that recommendation from SJM is to not replace the device at this time. The patient is not device dependant.  The patient has had appropriate device therapy in the past or implanted for secondary prevention.    He is actively remotely monitored and understands the importance of compliance today.   4. Obesity Body mass index is 35.13 kg/(m^2). I have encouraged weight loss as his weight impacts our ability to care for his CHF.  Merlin remote monitoring Return to see EP NP in 1 year  Thompson Grayer MD, [facility] 04/23/2015 4:30 PM

## 2015-04-23 NOTE — Patient Instructions (Signed)
Medication Instructions:  Your physician recommends that you continue on your current medications as directed. Please refer to the Current Medication list given to you today.   Labwork: Your physician recommends that you return for lab today: BMP/BNP   Testing/Procedures: None ordered   Follow-Up: Remote monitoring is used to monitor your ICD from home. This monitoring reduces the number of office visits required to check your device to one time per year. It allows [place] to keep an eye on the functioning of your device to ensure it is working properly. You are scheduled for a device check from home on 07/23/15. You may send your transmission at any time that day. If you have a wireless device, the transmission will be sent automatically. After your physician reviews your transmission, you will receive a postcard with your next transmission date.  Your physician wants you to follow-up in: 12 months with Chanetta Marshall, NP You will receive a reminder letter in the mail two months in advance. If you don't receive a letter, please call our office to schedule the follow-up appointment.     Any Other Special Instructions Will Be Listed Below (If Applicable).     If you need a refill on your cardiac medications before your next appointment, please call your pharmacy.

## 2015-04-24 ENCOUNTER — Telehealth: Payer: Self-pay | Admitting: *Deleted

## 2015-04-24 NOTE — Telephone Encounter (Signed)
Left message for pt to call, he is unable to afford the entresto. Need to give him the number to the company for assistance-[phone].

## 2015-05-01 ENCOUNTER — Encounter: Payer: Self-pay | Admitting: *Deleted

## 2015-05-01 NOTE — Telephone Encounter (Signed)
This encounter was created in error - please disregard.

## 2015-05-01 NOTE — Telephone Encounter (Signed)
Left message for pt to call, pt is having trouble getting his entresto. Need to give the number to entresto central-[phone]

## 2015-05-01 NOTE — Progress Notes (Signed)
HPI: FU CM/CHF. Echo 2/15 revealed EF of 20-25% with diffuse hypokinesis and biatrial enlargement, mild MR and a small pericardial effusion. TSH in February 2015 normal. Cardiac catheterization in March of 2015 showed normal coronary arteries. Echocardiogram repeated in June 2016 and showed ejection fraction 25-30%, moderate left ventricular hypertrophy, grade 1 diastolic dysfunction and moderate left atrial enlargement. Placed on entresto at last ov. Since last seen, the patient has dyspnea with more extreme activities but not with routine activities. It is relieved with rest. It is not associated with chest pain. There is no orthopnea, PND or pedal edema. There is no syncope or palpitations. There is no exertional chest pain.   Current Outpatient Prescriptions  Medication Sig Dispense Refill  . ACCU-CHEK SOFTCLIX LANCETS lancets 1 each by Other route 3 (three) times daily. (Patient taking differently: 1 each by Other route See admin instructions. Check blood sugar once weekly) 100 each 12  . aspirin 81 MG tablet Take 81 mg by mouth daily.    . Blood Glucose Monitoring Suppl (ACCU-CHEK AVIVA PLUS) W/DEVICE KIT 1 Device by Does not apply route 3 (three) times daily after meals. (Patient taking differently: 1 Device by Does not apply route See admin instructions. Check blood sugar once weekly) 1 kit 0  . carvedilol (COREG) 25 MG tablet Take 1 tablet (25 mg total) by mouth 2 (two) times daily. 180 tablet 3  . digoxin (LANOXIN) 0.125 MG tablet Take 1 tablet (0.125 mg total) by mouth daily. 90 tablet 3  . furosemide (LASIX) 20 MG tablet Take 20 mg by mouth 2 (two) times daily.    Marland Kitchen glucose blood (ACCU-CHEK AVIVA PLUS) test strip 1 each by Other route 3 (three) times daily. (Patient taking differently: 1 each by Other route See admin instructions. Check blood sugar once weekly) 100 each 12  . Lancet Devices (ACCU-CHEK SOFTCLIX) lancets 1 each by Other route 3 (three) times daily. (Patient taking  differently: 1 each by Other route See admin instructions. Check blood sugar once weekly) 1 each 0  . metFORMIN (GLUCOPHAGE) 1000 MG tablet Take 1 tablet (1,000 mg total) by mouth 2 (two) times daily with a meal. 180 tablet 3  . pantoprazole (PROTONIX) 40 MG tablet Take 1 tablet (40 mg total) by mouth daily. 30 tablet 0  . potassium chloride SA (K-DUR,KLOR-CON) 20 MEQ tablet Take 1 tablet (20 mEq total) by mouth daily. 90 tablet 3  . sacubitril-valsartan (ENTRESTO) 24-26 MG Take 1 tablet by mouth 2 (two) times daily.    . sitaGLIPtin (JANUVIA) 100 MG tablet Take 1 tablet (100 mg total) by mouth daily. 90 tablet 3  . spironolactone (ALDACTONE) 25 MG tablet TAKE ONE TABLET BY MOUTH DAILY (Patient taking differently: Take 25 mg by mouth once daily) 90 tablet 2   No current facility-administered medications for this visit.     Past Medical History  Diagnosis Date  . Obesity   . 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 History of renal calculi   . Cardiomyopathy ([place])     a. 07/2013 Echo: EF 20-25%, diff HK, Gr2 DD, mild MR, mod dil LA/RA.  Marland Kitchen Fracture of left humerus     a. 07/2013.  Marland Kitchen Chronic bronchitis ([place])     "get it most q yr" (12/23/2013)  . Kidney stones   . GERD (gastroesophageal reflux disease)     not at present time  . Heart murmur     "  born w/it"   . Obesity   . Chronic systolic dysfunction of left ventricle   . Syncope   . Hypertension   . Type II diabetes mellitus ([place]) 2006  . CHF (congestive heart failure) ([place])   . 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 Cholecystectomy  ~ 2010  . Cardiac catheterization  08/10/13  . Ureteroscopy      "laser for kidney stones"  . Orif humerus fracture Left 08/12/2013    Procedure: OPEN REDUCTION INTERNAL FIXATION (ORIF) HUMERAL SHAFT FRACTURE;  Surgeon: Newt Minion, MD;  Location: [place];  Service:  Orthopedics;  Laterality: Left;  Open Reduction Internal Fixation Left Humerus  . Implantable cardioverter defibrillator implant  12/23/2013    STJ Fortify ICD implanted by [name] for cardiomyopathy and syncope  . Left heart catheterization with coronary angiogram N/A 08/10/2013    Procedure: LEFT HEART CATHETERIZATION WITH CORONARY ANGIOGRAM;  Surgeon: Jettie Booze, MD;  Location: [facility] CATH LAB;  Service: Cardiovascular;  Laterality: N/A;  . Implantable cardioverter defibrillator implant N/A 12/23/2013    Procedure: IMPLANTABLE CARDIOVERTER DEFIBRILLATOR IMPLANT;  Surgeon: Coralyn Mark, MD;  Location: [facility] CATH LAB;  Service: Cardiovascular;  Laterality: N/A;    Social History   Social History  . Marital Status: Single    Spouse Name: N/A  . Number of Children: N/A  . Years of Education: N/A   Occupational History  . Not on file.   Social History Main Topics  . Smoking status: Former Smoker -- 1.00 packs/day for 28 years    Types: Cigarettes    Quit date: 06/03/2014  . Smokeless tobacco: Never Used  . Alcohol Use: 0.0 oz/week    0 Standard drinks or equivalent per week     Comment: 12/23/2013 "might drink a couple times/yr"  . Drug Use: Yes    Special: Marijuana  . Sexual Activity: Yes   Other Topics Concern  . Not on file   Social History Narrative    ROS: no fevers or chills, productive cough, hemoptysis, dysphasia, odynophagia, melena, hematochezia, dysuria, hematuria, rash, seizure activity, orthopnea, PND, pedal edema, claudication. Remaining systems are negative.  Physical Exam: Well-developed well-nourished in no acute distress.  Skin is warm and dry.  HEENT is normal.  Neck is supple.  Chest is clear to auscultation with normal expansion.  Cardiovascular exam is regular rate and rhythm.  Abdominal exam nontender or distended. No masses palpated. Extremities show no edema. neuro grossly intact

## 2015-05-04 ENCOUNTER — Other Ambulatory Visit: Payer: Self-pay | Admitting: Internal Medicine

## 2015-05-09 LAB — BASIC METABOLIC PANEL
BUN: 11 mg/dL (ref 7–25)
CALCIUM: 9.1 mg/dL (ref 8.6–10.3)
CO2: 30 mmol/L (ref 20–31)
CREATININE: 1.03 mg/dL (ref 0.70–1.33)
Chloride: 96 mmol/L — ABNORMAL LOW (ref 98–110)
GLUCOSE: 248 mg/dL — AB (ref 65–99)
Potassium: 3.5 mmol/L (ref 3.5–5.3)
SODIUM: 136 mmol/L (ref 135–146)

## 2015-05-09 LAB — BRAIN NATRIURETIC PEPTIDE: BRAIN NATRIURETIC PEPTIDE: 11.5 pg/mL (ref 0.0–100.0)

## 2015-05-09 NOTE — Telephone Encounter (Signed)
Spoke with pt, he has been able to get the drug so far.

## 2015-05-11 ENCOUNTER — Ambulatory Visit (INDEPENDENT_AMBULATORY_CARE_PROVIDER_SITE_OTHER): Payer: Medicaid Other | Admitting: Cardiology

## 2015-05-11 ENCOUNTER — Encounter: Payer: Self-pay | Admitting: Cardiology

## 2015-05-11 VITALS — BP 128/80 | HR 73 | Ht 73.0 in | Wt 262.1 lb

## 2015-05-11 DIAGNOSIS — I5022 Chronic systolic (congestive) heart failure: Secondary | ICD-10-CM

## 2015-05-11 DIAGNOSIS — Z95810 Presence of automatic (implantable) cardiac defibrillator: Secondary | ICD-10-CM

## 2015-05-11 DIAGNOSIS — I428 Other cardiomyopathies: Secondary | ICD-10-CM

## 2015-05-11 DIAGNOSIS — R079 Chest pain, unspecified: Secondary | ICD-10-CM

## 2015-05-11 DIAGNOSIS — I429 Cardiomyopathy, unspecified: Secondary | ICD-10-CM | POA: Diagnosis not present

## 2015-05-11 DIAGNOSIS — G8929 Other chronic pain: Secondary | ICD-10-CM

## 2015-05-11 NOTE — Patient Instructions (Signed)
Your physician recommends that you schedule a follow-up appointment in: 3 MONTHS WITH DR CRENSHAW  If you need a refill on your cardiac medications before your next appointment, please call your pharmacy.   

## 2015-05-11 NOTE — Assessment & Plan Note (Signed)
Followed by electrophysiology. 

## 2015-05-11 NOTE — Assessment & Plan Note (Signed)
Patient appears to be euvolemic on examination. Continue present dose of diuretics. We discussed low sodium diet, fluid restriction and daily weights. He can take an extra 20 mg of Lasix for weight gain of 2 pounds.

## 2015-05-11 NOTE — Assessment & Plan Note (Signed)
Continue carvedilol and entresto.

## 2015-05-11 NOTE — Assessment & Plan Note (Signed)
No symptoms since last office visit.

## 2015-05-14 ENCOUNTER — Ambulatory Visit: Payer: Medicaid Other | Attending: Family Medicine | Admitting: Family Medicine

## 2015-05-14 ENCOUNTER — Encounter: Payer: Self-pay | Admitting: Family Medicine

## 2015-05-14 VITALS — BP 114/74 | HR 77 | Temp 97.0°F | Resp 16 | Ht 73.0 in | Wt 264.0 lb

## 2015-05-14 DIAGNOSIS — Z7982 Long term (current) use of aspirin: Secondary | ICD-10-CM | POA: Diagnosis not present

## 2015-05-14 DIAGNOSIS — Z87891 Personal history of nicotine dependence: Secondary | ICD-10-CM | POA: Diagnosis not present

## 2015-05-14 DIAGNOSIS — R21 Rash and other nonspecific skin eruption: Secondary | ICD-10-CM | POA: Insufficient documentation

## 2015-05-14 DIAGNOSIS — K219 Gastro-esophageal reflux disease without esophagitis: Secondary | ICD-10-CM

## 2015-05-14 DIAGNOSIS — E1165 Type 2 diabetes mellitus with hyperglycemia: Secondary | ICD-10-CM | POA: Insufficient documentation

## 2015-05-14 DIAGNOSIS — G5612 Other lesions of median nerve, left upper limb: Secondary | ICD-10-CM | POA: Insufficient documentation

## 2015-05-14 DIAGNOSIS — Z79899 Other long term (current) drug therapy: Secondary | ICD-10-CM | POA: Insufficient documentation

## 2015-05-14 DIAGNOSIS — Z7984 Long term (current) use of oral hypoglycemic drugs: Secondary | ICD-10-CM | POA: Insufficient documentation

## 2015-05-14 DIAGNOSIS — IMO0001 Reserved for inherently not codable concepts without codable children: Secondary | ICD-10-CM

## 2015-05-14 LAB — GLUCOSE, POCT (MANUAL RESULT ENTRY): POC GLUCOSE: 227 mg/dL — AB (ref 70–99)

## 2015-05-14 MED ORDER — METFORMIN HCL 1000 MG PO TABS: ORAL_TABLET | ORAL | Status: DC

## 2015-05-14 MED ORDER — PANTOPRAZOLE SODIUM 40 MG PO TBEC: 40.0000 mg | DELAYED_RELEASE_TABLET | Freq: Every day | ORAL | Status: DC

## 2015-05-14 MED ORDER — GABAPENTIN 300 MG PO CAPS: 300.0000 mg | ORAL_CAPSULE | Freq: Every day | ORAL | Status: DC

## 2015-05-14 NOTE — Assessment & Plan Note (Signed)
Intermittent rash with itching, suspect dry skin   Continue nightly benadryl I recommend cerave or eucerin as a moisturizer

## 2015-05-14 NOTE — Progress Notes (Signed)
Patient ID: [name], male   DOB: [birth year], 49 y.o.   MRN: [id number]   Subjective:  Patient ID: [name], male    DOB: 12-26-1965  Age: 49 y.o. MRN: [id number]  CC: Rash and Diabetes   HPI [name] presents for    1. CHRONIC DIABETES  Disease Monitoring  Blood Sugar Ranges: checking 1-2 times per week, sometimes in AM, sometimes in midday, 180-190. Sometimes fasting.   Polyuria: yes   Visual problems: no   Medication Compliance: yes  Medication Side Effects  Hypoglycemia: no   Preventitive Health Care  Eye Exam: due   Foot Exam: due   Diet pattern: skips meals   Exercise: walks daily   2. Rash: intermittent rash. Usually at night with itching on skin and in eyes. Taking benadrlyl. Using suave lotion.   Social History  Substance Use Topics  . Smoking status: Former Smoker -- 1.00 packs/day for 28 years    Types: Cigarettes    Quit date: 06/03/2014  . Smokeless tobacco: Never Used  . Alcohol Use: 0.0 oz/week    0 Standard drinks or equivalent per week     Comment: 12/23/2013 "might drink a couple times/yr"   Outpatient Prescriptions Prior to Visit  Medication Sig Dispense Refill  . ACCU-CHEK SOFTCLIX LANCETS lancets 1 each by Other route 3 (three) times daily. (Patient taking differently: 1 each by Other route See admin instructions. Check blood sugar once weekly) 100 each 12  . aspirin 81 MG tablet Take 81 mg by mouth daily.    . Blood Glucose Monitoring Suppl (ACCU-CHEK AVIVA PLUS) W/DEVICE KIT 1 Device by Does not apply route 3 (three) times daily after meals. (Patient taking differently: 1 Device by Does not apply route See admin instructions. Check blood sugar once weekly) 1 kit 0  . carvedilol (COREG) 25 MG tablet Take 1 tablet (25 mg total) by mouth 2 (two) times daily. 180 tablet 3  . digoxin (LANOXIN) 0.125 MG tablet Take 1 tablet (0.125 mg total) by mouth daily. 90 tablet 3  . furosemide (LASIX) 20 MG tablet Take 20 mg by mouth 2 (two) times  daily.    Marland Kitchen glucose blood (ACCU-CHEK AVIVA PLUS) test strip 1 each by Other route 3 (three) times daily. (Patient taking differently: 1 each by Other route See admin instructions. Check blood sugar once weekly) 100 each 12  . Lancet Devices (ACCU-CHEK SOFTCLIX) lancets 1 each by Other route 3 (three) times daily. (Patient taking differently: 1 each by Other route See admin instructions. Check blood sugar once weekly) 1 each 0  . metFORMIN (GLUCOPHAGE) 1000 MG tablet Take 1 tablet (1,000 mg total) by mouth 2 (two) times daily with a meal. 180 tablet 3  . potassium chloride SA (K-DUR,KLOR-CON) 20 MEQ tablet Take 1 tablet (20 mEq total) by mouth daily. 90 tablet 3  . sacubitril-valsartan (ENTRESTO) 24-26 MG Take 1 tablet by mouth 2 (two) times daily.    . sitaGLIPtin (JANUVIA) 100 MG tablet Take 1 tablet (100 mg total) by mouth daily. 90 tablet 3  . spironolactone (ALDACTONE) 25 MG tablet TAKE ONE TABLET BY MOUTH DAILY (Patient taking differently: Take 25 mg by mouth once daily) 90 tablet 2  . pantoprazole (PROTONIX) 40 MG tablet Take 1 tablet (40 mg total) by mouth daily. 30 tablet 0   No facility-administered medications prior to visit.    ROS Review of Systems  Constitutional: Negative for fever, chills, fatigue and unexpected weight change.  Eyes:  Negative for visual disturbance.  Respiratory: Negative for cough and shortness of breath.   Cardiovascular: Negative for chest pain, palpitations and leg swelling.  Gastrointestinal: Negative for nausea, vomiting, abdominal pain, diarrhea, constipation and blood in stool.  Endocrine: Negative for polydipsia, polyphagia and polyuria.  Musculoskeletal: Negative for myalgias, back pain, arthralgias, gait problem and neck pain.  Skin: Positive for rash.  Allergic/Immunologic: Negative for immunocompromised state.  Hematological: Negative for adenopathy. Does not bruise/bleed easily.  Psychiatric/Behavioral: Negative for suicidal ideas, sleep  disturbance and dysphoric mood. The patient is not nervous/anxious.     Objective:  BP 114/74 mmHg  Pulse 77  Temp(Src) 97 F (36.1 C) (Oral)  Resp 16  Ht 6' 1"  (1.854 m)  Wt 264 lb (119.75 kg)  BMI 34.84 kg/m2  SpO2 97%  BP/Weight 05/14/2015 05/11/2015 23/53/6144  Systolic BP 315 400 867  Diastolic BP 74 80 80  Wt. (Lbs) 264 262.1 266.2  BMI 34.84 34.59 35.13   Physical Exam  Constitutional: He appears well-developed and well-nourished. No distress.  HENT:  Head: Normocephalic and atraumatic.  Eyes: Conjunctivae are normal. Pupils are equal, round, and reactive to light.  Neck: Normal range of motion. Neck supple.  Cardiovascular: Normal rate, regular rhythm, normal heart sounds and intact distal pulses.   Pulmonary/Chest: Effort normal and breath sounds normal.  Musculoskeletal: He exhibits no edema.  Neurological: He is alert.  Skin: Skin is warm and dry. Rash (papular rash on anterior shoulders ) noted. No erythema.  Psychiatric: He has a normal mood and affect.   Lab Results  Component Value Date   HGBA1C 10.2* 04/01/2015   CBG 227   Assessment & Plan:   Problem List Items Addressed This Visit    DM (diabetes mellitus), type 2, uncontrolled ([place]) - Primary (Chronic)    A: high fasting sugars P: Increase metformin to 2500 daily dose Keep januvia at 100 mg daily F/u in 8 weeks for A1c check       Relevant Medications   metFORMIN (GLUCOPHAGE) 1000 MG tablet   Other Relevant Orders   POCT glucose (manual entry) (Completed)   GERD (Chronic)   Relevant Medications   pantoprazole (PROTONIX) 40 MG tablet   Left median nerve neuropathy   Relevant Medications   gabapentin (NEURONTIN) 300 MG capsule   Rash    Intermittent rash with itching, suspect dry skin   Continue nightly benadryl I recommend cerave or eucerin as a moisturizer           Meds ordered this encounter  Medications  . pantoprazole (PROTONIX) 40 MG tablet    Sig: Take 1 tablet (40 mg  total) by mouth daily.    Dispense:  30 tablet    Refill:  3  . metFORMIN (GLUCOPHAGE) 1000 MG tablet    Sig: 1000 mg in morning, 1500 mg in PM    Dispense:  225 tablet    Refill:  3  . gabapentin (NEURONTIN) 300 MG capsule    Sig: Take 1 capsule (300 mg total) by mouth at bedtime.    Dispense:  30 capsule    Refill:  3    Follow-up: No Follow-up on file.   Boykin Nearing MD

## 2015-05-14 NOTE — Assessment & Plan Note (Signed)
A: high fasting sugars P: Increase metformin to 2500 daily dose Keep januvia at 100 mg daily F/u in 8 weeks for A1c check

## 2015-05-14 NOTE — Patient Instructions (Addendum)
[name] was seen today for rash and diabetes.  Diagnoses and all orders for this visit:  Uncontrolled diabetes mellitus type 2 without complications, unspecified long term insulin use status (HCC) -     POCT glucose (manual entry) -     metFORMIN (GLUCOPHAGE) 1000 MG tablet; 1000 mg in morning, 1500 mg in PM  Left median nerve neuropathy -     gabapentin (NEURONTIN) 300 MG capsule; Take 1 capsule (300 mg total) by mouth at bedtime.  Gastroesophageal reflux disease, esophagitis presence not specified -     pantoprazole (PROTONIX) 40 MG tablet; Take 1 tablet (40 mg total) by mouth daily.   Rash with itching Continue nightly benadryl I recommend cerave or eucerin as a moisturizer   F/u in 8 weeks for diabetes   Dr. Adrian Blackwater

## 2015-05-14 NOTE — Progress Notes (Signed)
F/U DM Rash on  Body  No pain today  No suicide thought in the past two weeks

## 2015-05-15 ENCOUNTER — Ambulatory Visit (INDEPENDENT_AMBULATORY_CARE_PROVIDER_SITE_OTHER): Payer: Medicaid Other

## 2015-05-15 ENCOUNTER — Telehealth: Payer: Self-pay | Admitting: Cardiology

## 2015-05-15 DIAGNOSIS — I5022 Chronic systolic (congestive) heart failure: Secondary | ICD-10-CM | POA: Diagnosis not present

## 2015-05-15 DIAGNOSIS — Z95810 Presence of automatic (implantable) cardiac defibrillator: Secondary | ICD-10-CM

## 2015-05-15 NOTE — Telephone Encounter (Signed)
Spoke with pt and reminded pt of remote transmission that is due today. Pt verbalized understanding.   

## 2015-05-17 ENCOUNTER — Telehealth: Payer: Self-pay

## 2015-05-17 NOTE — Telephone Encounter (Signed)
Attempted return ICM call to patient and left message.   Patient returned call and left message.

## 2015-05-17 NOTE — Progress Notes (Signed)
EPIC Encounter for ICM Monitoring  Patient Name: [name] is a 49 y.o. male Date: 05/17/2015 Primary Care Physican: Minerva Ends, MD Primary Cardiologist: Stanford Breed Electrophysiologist: Allred Dry Weight: 264 lbs at last office visit 05/14/2015       In the past month, have you:  1. Gained more than 2 pounds in a day or more than 5 pounds in a week? Unknown.  Patient does not weigh at home.  At [facility] call 04/13/2015 he reported weight as 255 lbs.  Education given importance of weighing daily, at same time, with same amount of clothing to get an accurate weight reading and to call if he has weight gain of 2-3 lb within 24 hours or 5 pounds in a week.     2. Had changes in your medications (with verification of current medications)? no  3. Had more shortness of breath than is usual for you? Yes, on exertion  4. Limited your activity because of shortness of breath? no  5. Not been able to sleep because of shortness of breath? no  6. Had increased swelling in your feet or ankles? no  7. Had symptoms of dehydration (dizziness, dry mouth, increased thirst, decreased urine output) no  8. Had changes in sodium restriction? no  9. Been compliant with medication? No, has missed 2 dosages of Furosemide in last 2 weeks.    ICM trend:  05/15/2015    Follow-up plan: ICM clinic phone appointment on 07/31/2014.  Corvue daily impedance below baseline from 05/09/2015 to 05/15/2015 suggesting fluid retention.  He reported symptom of SOB in the last week.  He has missed 2 dosages of Furosemide, eating foods for the holiday that are higher in sodium and high fluid intake.  Advised to limit salt intake to 2000 mg daily and fluid intake to 64 oz daily.          I had recommended patient increase Furosemide help decrease fluid retention and patient stated he was not comfortable with increasing dosage.         He reported he is already taking Furosemide 20 mg - 2 tablets (40 mg) in the morning and 2  tablets in the afternoon for a total of 80 mg a day for several months.         He stated physician had told him several months ago to take that dosage.  Directions listed for current Furosemide prescription is 20 mg twice a day.    Advised will send to Dr Stanford Breed and [name] for review to confirm which Furosemide dosage he should be taking on a daily basis and if recommendations for the Corvue impedance reading.    Copy of note sent to patient's primary care physician, primary cardiologist, and device following physician.  [name], RN, CCM 05/17/2015 12:36 PM  Call to patient to check on how he is feeling.  He stated his breathing is about the same.  He stated his main concern is he has a large bruise on his hand and the hand white.  He stated he has some pain and wanted to know if he should go the urgent care.  He said he looked on the internet and read it could be related to heart disease or blood clot.  I advised him to go to the urgent care if he is concerned since I cannot see the hand to make an assessment over the phone.  He stated his hand is warm to touch.  Advised to go  to the Urgent care, monitor for temperature change in the hand such as cold or hot to touch, swelling or changes in breathing.  He stated he did not injure the hand and is on aspirin.     Advised have not received any recommendations from physician and if notified of changes will call him.  Advised him to continue the Furosemide dosage that was discussed with Dr Stanford Breed.

## 2015-05-17 NOTE — Telephone Encounter (Signed)
ICM transmission received.  Attempted call and left message for return call.  Left message for return call.

## 2015-05-18 ENCOUNTER — Emergency Department (HOSPITAL_COMMUNITY): Payer: Medicaid Other

## 2015-05-18 ENCOUNTER — Emergency Department (INDEPENDENT_AMBULATORY_CARE_PROVIDER_SITE_OTHER): Payer: Medicaid Other

## 2015-05-18 ENCOUNTER — Encounter (HOSPITAL_COMMUNITY): Payer: Self-pay

## 2015-05-18 ENCOUNTER — Emergency Department (INDEPENDENT_AMBULATORY_CARE_PROVIDER_SITE_OTHER)
Admission: EM | Admit: 2015-05-18 | Discharge: 2015-05-18 | Disposition: A | Discharge status: Home / Self Care | Payer: Medicaid Other | Source: Home / Self Care

## 2015-05-18 DIAGNOSIS — M79641 Pain in right hand: Secondary | ICD-10-CM

## 2015-05-18 NOTE — Telephone Encounter (Signed)
Spoke with patient.

## 2015-05-18 NOTE — ED Provider Notes (Signed)
CSN: [id number]     Arrival date & time 05/18/15  1936 History   None    Chief Complaint  Patient presents with  . Hand Pain   (Consider location/radiation/quality/duration/timing/severity/associated sxs/prior Treatment) HPI History obtained from patient:   LOCATION: right hand SEVERITY:3 DURATION:last night CONTEXT:sitting and watching TV states the palm of his right hand suddenly went white less than for about 5 or 6 seconds. Then left a small bruise on the palm of his hand. QUALITY: Soreness  MODIFYING FACTORS:None  ASSOCIATED SYMPTOMS:None  TIMING:Constant patient states that he looked online because he has an implanted defibrillator and he is concerned that I may have moved causing his with his arm states that he spoke with the cardiology nurse today and she advised him to be seen for this issue.  Past Medical History  Diagnosis Date  . Obesity   . 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 History of renal calculi   . Cardiomyopathy ([place])     a. 07/2013 Echo: EF 20-25%, diff HK, Gr2 DD, mild MR, mod dil LA/RA.  Marland Kitchen Fracture of left humerus     a. 07/2013.  Marland Kitchen Chronic bronchitis ([place])     "get it most q yr" (12/23/2013)  . Kidney stones   . GERD (gastroesophageal reflux disease)     not at present time  . Heart murmur     "born w/it"   . Obesity   . Chronic systolic dysfunction of left ventricle   . Syncope   . Hypertension   . Type II diabetes mellitus ([place]) 2006  . CHF (congestive heart failure) ([place])   . 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 Cholecystectomy  ~ 2010  . Cardiac catheterization  08/10/13  . Ureteroscopy      "laser for kidney stones"  . Orif humerus fracture Left 08/12/2013    Procedure: OPEN REDUCTION INTERNAL FIXATION (ORIF) HUMERAL SHAFT FRACTURE;  Surgeon: Newt Minion, MD;  Location: [place];  Service: Orthopedics;   Laterality: Left;  Open Reduction Internal Fixation Left Humerus  . Implantable cardioverter defibrillator implant  12/23/2013    STJ Fortify ICD implanted by [name] for cardiomyopathy and syncope  . Left heart catheterization with coronary angiogram N/A 08/10/2013    Procedure: LEFT HEART CATHETERIZATION WITH CORONARY ANGIOGRAM;  Surgeon: Jettie Booze, MD;  Location: [facility] CATH LAB;  Service: Cardiovascular;  Laterality: N/A;  . Implantable cardioverter defibrillator implant N/A 12/23/2013    Procedure: IMPLANTABLE CARDIOVERTER DEFIBRILLATOR IMPLANT;  Surgeon: Coralyn Mark, MD;  Location: [facility] CATH LAB;  Service: Cardiovascular;  Laterality: N/A;   Family History  Problem Relation Age of Onset  . Diabetes Father   . Arthritis Father   . Diabetes Mother   . Arthritis Mother   . Hypertension Brother    Social History  Substance Use Topics  . Smoking status: Former Smoker -- 1.00 packs/day for 28 years    Types: Cigarettes    Quit date: 06/03/2014  . Smokeless tobacco: Never Used  . Alcohol Use: 0.0 oz/week    0 Standard drinks or equivalent per week     Comment: 12/23/2013 "might drink a couple times/yr"    Review of Systems ROS +'ve right palm pain  Denies: HEADACHE, NAUSEA, ABDOMINAL PAIN, CHEST PAIN, CONGESTION, DYSURIA, SHORTNESS OF BREATH   Allergies  Bee venom  Home Medications   Prior to  Admission medications   Medication Sig Start Date End Date Taking? Authorizing Provider  ACCU-CHEK SOFTCLIX LANCETS lancets 1 each by Other route 3 (three) times daily. Patient taking differently: 1 each by Other route See admin instructions. Check blood sugar once weekly 10/20/14   Boykin Nearing, MD  aspirin 81 MG tablet Take 81 mg by mouth daily.    Historical Provider, MD  Blood Glucose Monitoring Suppl (ACCU-CHEK AVIVA PLUS) W/DEVICE KIT 1 Device by Does not apply route 3 (three) times daily after meals. Patient taking differently: 1 Device by Does not apply route See admin  instructions. Check blood sugar once weekly 10/20/14   Boykin Nearing, MD  carvedilol (COREG) 25 MG tablet Take 1 tablet (25 mg total) by mouth 2 (two) times daily. 12/29/14   Lelon Perla, MD  digoxin (LANOXIN) 0.125 MG tablet Take 1 tablet (0.125 mg total) by mouth daily. 02/06/15   Lelon Perla, MD  furosemide (LASIX) 20 MG tablet Take 20 mg by mouth 2 (two) times daily.    Historical Provider, MD  gabapentin (NEURONTIN) 300 MG capsule Take 1 capsule (300 mg total) by mouth at bedtime. 05/14/15   Josalyn Funches, MD  glucose blood (ACCU-CHEK AVIVA PLUS) test strip 1 each by Other route 3 (three) times daily. Patient taking differently: 1 each by Other route See admin instructions. Check blood sugar once weekly 10/20/14   Boykin Nearing, MD  Lancet Devices [facility]) lancets 1 each by Other route 3 (three) times daily. Patient taking differently: 1 each by Other route See admin instructions. Check blood sugar once weekly 10/20/14   Boykin Nearing, MD  metFORMIN (GLUCOPHAGE) 1000 MG tablet 1000 mg in morning, 1500 mg in PM 05/14/15   Josalyn Funches, MD  pantoprazole (PROTONIX) 40 MG tablet Take 1 tablet (40 mg total) by mouth daily. 05/14/15   Josalyn Funches, MD  potassium chloride SA (K-DUR,KLOR-CON) 20 MEQ tablet Take 1 tablet (20 mEq total) by mouth daily. 11/20/14   Lelon Perla, MD  sacubitril-valsartan (ENTRESTO) 24-26 MG Take 1 tablet by mouth 2 (two) times daily.    Historical Provider, MD  sitaGLIPtin (JANUVIA) 100 MG tablet Take 1 tablet (100 mg total) by mouth daily. 12/25/14   Josalyn Funches, MD  spironolactone (ALDACTONE) 25 MG tablet TAKE ONE TABLET BY MOUTH DAILY Patient taking differently: Take 25 mg by mouth once daily 02/15/15   Lelon Perla, MD   Meds Ordered and Administered this Visit  Medications - No data to display  There were no vitals taken for this visit. No data found.   Physical Exam  Constitutional: He is oriented to person, place, and time.  He appears well-developed and well-nourished.  Cardiovascular: Normal rate.   Musculoskeletal: Normal range of motion. He exhibits no tenderness.  Neurological: He is alert and oriented to person, place, and time.  Skin: Skin is warm and dry.  Psychiatric: He has a normal mood and affect. His behavior is normal. Thought content normal.  Vitals reviewed.   ED Course  Procedures (including critical care time)  Labs Review Labs Reviewed - No data to display  Imaging Review Dg Hand 2 View Left  05/18/2015  CLINICAL DATA:  Left hand pain starting yesterday no known injury EXAM: LEFT HAND - 2 VIEW COMPARISON:  None. FINDINGS: Two views of the left hand submitted. No acute fracture or subluxation. No radiopaque foreign body. IMPRESSION: Negative. Electronically Signed   By: Lahoma Crocker M.D.   On: 05/18/2015 20:31  Visual Acuity Review  Right Eye Distance:   Left Eye Distance:   Bilateral Distance:    Right Eye Near:   Left Eye Near:    Bilateral Near:         MDM   1. Hand pain, right    No obvious bony injury on x-ray. I explained to patient not sure why his hand turned white last night but there are no neurological findings or vascular issues at this time. Patient should follow-up with his primary care provider if there are new or worsening of symptoms.    Konrad Felix, PA 05/18/15 2044

## 2015-05-18 NOTE — ED Notes (Signed)
Pt stated that he has been having hand pain for 1 day and that it is bruised Pt did not injure hand at all. Pt is alert and oriented.

## 2015-05-18 NOTE — Discharge Instructions (Signed)
Pain Without a Known Cause °WHAT IS PAIN WITHOUT A KNOWN CAUSE? °Pain can occur in any part of the body and can range from mild to severe. Sometimes no cause can be found for why you are having pain. Some types of pain that can occur without a known cause include:  °· Headache. °· Back pain. °· Abdominal pain. °· Neck pain. °HOW IS PAIN WITHOUT A KNOWN CAUSE DIAGNOSED?  °Your health care provider will try to find the cause of your pain. This may include: °· Physical exam. °· Medical history. °· Blood tests. °· Urine tests. °· X-rays. °If no cause is found, your health care provider may diagnose you with pain without a known cause.  °IS THERE TREATMENT FOR PAIN WITHOUT A CAUSE?  °Treatment depends on the kind of pain you have. Your health care provider may prescribe medicines to help relieve your pain.  °WHAT CAN I DO AT HOME FOR MY PAIN?  °· Take medicines only as directed by your health care provider. °· Stop any activities that cause pain. During periods of severe pain, bed rest may help. °· Try to reduce your stress with activities such as yoga or meditation. Talk to your health care provider for other stress-reducing activity recommendations. °· Exercise regularly, if approved by your health care provider. °· Eat a healthy diet that includes fruits and vegetables. This may improve pain. Talk to your health care provider if you have any questions about your diet. °WHAT IF MY PAIN DOES NOT GET BETTER?  °If you have a painful condition and no reason can be found for the pain or the pain gets worse, it is important to follow up with your health care provider. It may be necessary to repeat tests and look further for a possible cause.  °  °This information is not intended to replace advice given to you by your health care provider. Make sure you discuss any questions you have with your health care provider. °  °Document Released: 02/18/2001 Document Revised: 06/16/2014 Document Reviewed: 10/11/2013 °Elsevier  Interactive Patient Education ©2016 Elsevier Inc. ° °

## 2015-05-21 ENCOUNTER — Telehealth: Payer: Self-pay

## 2015-05-21 NOTE — Telephone Encounter (Signed)
Call to patient.  Requested him to send a manual transmission today to get updated reading on his fluid levels.  He stated he would send around 3pm today.    Patient left message stating he was returning call.

## 2015-05-21 NOTE — Telephone Encounter (Signed)
Attempted call to patient to send new ICM transmission today for review. Left message for return call.

## 2015-05-22 ENCOUNTER — Telehealth: Payer: Self-pay | Admitting: Family Medicine

## 2015-05-22 ENCOUNTER — Ambulatory Visit (INDEPENDENT_AMBULATORY_CARE_PROVIDER_SITE_OTHER): Payer: Medicaid Other

## 2015-05-22 DIAGNOSIS — I5022 Chronic systolic (congestive) heart failure: Secondary | ICD-10-CM

## 2015-05-22 DIAGNOSIS — Z95810 Presence of automatic (implantable) cardiac defibrillator: Secondary | ICD-10-CM

## 2015-05-22 NOTE — Telephone Encounter (Signed)
Call to patient and explained the ICM transmission update was not received on 05/21/2015 and requested to send one today. He said he did send it yesterday but will send another one now.

## 2015-05-22 NOTE — Progress Notes (Signed)
EPIC Encounter for ICM Monitoring  Patient Name: [name] is a 49 y.o. male Date: 05/22/2015 Primary Care Physican: Minerva Ends, MD Primary Cardiologist: Stanford Breed Electrophysiologist: Allred Dry Weight: Unknown, not weighing at home.        In the past month, have you:  1. Gained more than 2 pounds in a day or more than 5 pounds in a week? Unknown, previously discussed importance of daily weight monitoring.   2. Had changes in your medications (with verification of current medications)? no  3. Had more shortness of breath than is usual for you? no  4. Limited your activity because of shortness of breath? no  5. Not been able to sleep because of shortness of breath? no  6. Had increased swelling in your feet or ankles? no  7. Had symptoms of dehydration (dizziness, dry mouth, increased thirst, decreased urine output) no  8. Had changes in sodium restriction? no  9. Been compliant with medication? Patient is taking double the amount of Furosemide than the prescription.  He reported he takes Furosemide 40 mg bid.  He reported physician told him to take it that way several months ago.   Prescription is Furosemide 20 mg bid.  Dr Jacalyn Lefevre last note on 05/11/2015 read patient to continue with present Furosemide dosage of 20 mg bid and can take extra 20 mg for weight gain of 2 lbs.    ICM trend:  05/22/2015 1 year view           3 month view:  Follow-up plan: ICM clinic phone appointment on 06/07/2015.  He has appointment with Dr Stanford Breed on 08/05/2014.  Repeat transmission today.  Corvue daily impedance below baseline 05/09/2015 to 05/20/2015 and 05/22/2015 suggesting fluid retention.  He denied missing any Furosemide dosages since last transmission on 05/15/2015.  He reported SOB remains unchanged and not new for him.  Reiterated to follow low sodium diet.  Last BUN and Creatinine on 05/08/2015 were normal.    Advised would send for Dr Stanford Breed and [name] to review  ICM transmission and any recommendations.  Patient taking Furosemide differently than what is prescribed, he is taking 40 mg bid and prescription if for 20 mg bid.        Copy of note sent to patient's primary care physician, primary cardiologist, and device following physician.  [name], RN, CCM 05/22/2015 12:04 PM   Sent: Emmie Niemann May 23, 2015 11:00 AM     To: [name], RN        Message             ----- Message -----     From: Lelon Perla, MD     Sent: 05/23/2015  5:27 AM      To: Cristopher Estimable, RN        Change lasix to 60 bid, bmet one week    [name]        ----- Message -----     From: Thompson Grayer, MD     Sent: 05/22/2015  9:00 PM      To: Lelon Perla, MD, [name], RN        Very frequent abnormal readings    Would probably benefit from additional lasix long term. Aaron Edelman, do you have thoughts on him?                ----- Message -----     From: [name], RN  Sent: 05/22/2015 12:24 PM      To: Thompson Grayer, MD    Patient notified to increase Lasix to 60 mg twice a day and come to [address] office for labs on 05/31/2015.  He verbalized understanding.

## 2015-05-22 NOTE — Telephone Encounter (Signed)
Patient called and stated he was having trouble sending transmission.  He contacted [facility] and was told there was difficulty with the signal.  He reported the monitor was moved a couple of times to check for better signal but still not working.  He stated he will try again this afternoon and call me when it does send.

## 2015-05-22 NOTE — Telephone Encounter (Signed)
Pt called and stated that he was returning Nubia's call. Thank you, Fonda Kinder, ASA

## 2015-05-28 NOTE — Telephone Encounter (Signed)
Spoke with patient.

## 2015-05-31 ENCOUNTER — Other Ambulatory Visit (INDEPENDENT_AMBULATORY_CARE_PROVIDER_SITE_OTHER): Payer: Medicaid Other | Admitting: *Deleted

## 2015-05-31 DIAGNOSIS — I5022 Chronic systolic (congestive) heart failure: Secondary | ICD-10-CM

## 2015-05-31 LAB — BASIC METABOLIC PANEL
BUN: 9 mg/dL (ref 7–25)
CHLORIDE: 98 mmol/L (ref 98–110)
CO2: 29 mmol/L (ref 20–31)
Calcium: 9.1 mg/dL (ref 8.6–10.3)
Creat: 1.12 mg/dL (ref 0.70–1.33)
Glucose, Bld: 212 mg/dL — ABNORMAL HIGH (ref 65–99)
POTASSIUM: 3.6 mmol/L (ref 3.5–5.3)
SODIUM: 137 mmol/L (ref 135–146)

## 2015-05-31 NOTE — Addendum Note (Signed)
Addended by: Eulis Foster on: 05/31/2015 08:32 AM   Modules accepted: Orders

## 2015-06-01 NOTE — Telephone Encounter (Signed)
Patient verified DOB Patient advised of call regarding Diabetic Study. The study is on hold until the new year. Medical Assistant will contact patient once the office begins the new sessions in the new year.

## 2015-06-05 ENCOUNTER — Telehealth: Payer: Self-pay | Admitting: Cardiology

## 2015-06-05 MED ORDER — FUROSEMIDE 20 MG PO TABS: 60.0000 mg | ORAL_TABLET | Freq: Two times a day (BID) | ORAL | Status: DC

## 2015-06-05 NOTE — Telephone Encounter (Signed)
E-SENT MEDICATIONS  TO PHARMACY 20 MG TABLET  PATIENT AWARE

## 2015-06-05 NOTE — Telephone Encounter (Signed)
°*  STAT* If patient is at the pharmacy, call can be transferred to refill team.   1. Which medications need to be refilled? (please list name of each medication and dose if known)Furosemide 40mg ** Needs a new prescription   2. Which pharmacy/location (including street and city if local pharmacy) is medication to be sent to?[facility] on [address]    3. Do they need a 30 day or 90 day supply? [place]

## 2015-06-07 ENCOUNTER — Ambulatory Visit (INDEPENDENT_AMBULATORY_CARE_PROVIDER_SITE_OTHER): Payer: Medicaid Other

## 2015-06-07 ENCOUNTER — Telehealth: Payer: Self-pay

## 2015-06-07 ENCOUNTER — Telehealth: Payer: Self-pay | Admitting: Cardiology

## 2015-06-07 DIAGNOSIS — Z95810 Presence of automatic (implantable) cardiac defibrillator: Secondary | ICD-10-CM

## 2015-06-07 DIAGNOSIS — I5022 Chronic systolic (congestive) heart failure: Secondary | ICD-10-CM

## 2015-06-07 NOTE — Telephone Encounter (Signed)
Attempted return ICM call to patient.  Received voice mail message from patient reporting he has gained 4 lbs overnight. He requested a returned call.

## 2015-06-07 NOTE — Progress Notes (Signed)
EPIC Encounter for ICM Monitoring  Patient Name: [name] is a 49 y.o. male Date: 06/07/2015 Primary Care Physican: Minerva Ends, MD Primary Cardiologist: Stanford Breed Electrophysiologist: Allred Dry Weight: 254 lb       In the past month, have you:  1. Gained more than 2 pounds in a day or more than 5 pounds in a week? Yes, 4 pounds since yesterday.    2. Had changes in your medications (with verification of current medications)? no  3. Had more shortness of breath than is usual for you? no  4. Limited your activity because of shortness of breath? no  5. Not been able to sleep because of shortness of breath? no  6. Had increased swelling in your feet or ankles? no  7. Had symptoms of dehydration (dizziness, dry mouth, increased thirst, decreased urine output) no  8. Had changes in sodium restriction? no  9. Been compliant with medication? No, patient reported at last ICM call on 05/22/2015 he was taking Lasix 20 mg (1 tablet) bid for total of 80 mg.  He did not realize that his tablet were 40 mg tablets and had been taking (2 tablets) bid for total of 160 mg a day.  He did not realize his mistake until he received the Lasix refill on 06/05/2015 for Lasix 20 mg (3 tablets) bid.  Since he started on lower dosage on 06/05/2015 he gained 4 pounds last night.       ICM trend: 06/07/2015 - 3 month view   ICM Trend:  1 year view   Follow-up plan: ICM clinic phone appointment pending.  Corvue impedance above baseline 05/24/2015 to 05/28/2015 and 06/03/2015 to 06/05/2015 suggesting dryness and below baseline starting 06/06/2015 suggesting fluid retention which correlates with his weight gain.  He reported he has been drinking same amount of fluids and follow low sodium diet.  His weight has increased by 4 lbs.  Labs from 05/31/2015: Creatinine 1.12, Potassium 3.6, BUN 9.  Reviewed transmission with [name] in the office.  Recommended he take Furosemide 80 mg bid x 2 days and  Potassium 40 meq x 2 days.  After the 2 days should resume the dosage that Dr Stanford Breed has prescribed which is Furosemide 20 mg (3 tablets) bid for total of 120 mg daily and Potassium 20 meq daily.    Patient notified of Dr Jackalyn Lombard order and verbalized understanding.  Advised will send to Dr Stanford Breed for review and if any further recommendations will call him back.   Copy of note sent to patient's primary care physician, primary cardiologist, and device following physician.  [name], RN, CCM 06/07/2015 5:36 PM

## 2015-06-07 NOTE — Telephone Encounter (Signed)
Spoke with patient.

## 2015-06-07 NOTE — Telephone Encounter (Signed)
Spoke with pt and reminded pt of remote transmission that is due today. Pt verbalized understanding.   

## 2015-06-12 NOTE — Progress Notes (Signed)
Call to patient to provide recommendations per Dr Stanford Breed.  Advised to take Lasix 80 mg bid and BMET in a week.  Advised will schedule order for BMET on 06/20/2015 at [facility] [address] office.  Patient reported he has continued the Lasix 80 mg bid since last ICM call due to he was SOB and gained 2 lbs.  Patient reported his weight today was 249 lbs and he reported he provided the wrong weight information on 06/07/2015 of 264 lbs.  He reported his dry weight is usually 250 lbs. Patient has not reported accurate information regarding his meds and weight when asked last week.  Recommended he call Dr Jacalyn Lefevre office today to see if he can get an earlier appointment than 08/06/2015 so Dr Stanford Breed can evaluate his current condition and medications.   Will repeat ICM transmission on 06/20/2015.      [name] Estimable, RN  [name] Billings, RN             Previous Messages     ----- Message -----   From: Lelon Perla, MD   Sent: 06/08/2015  6:40 AM    To: [name] Estimable, RN  Subject: FW: Optivol fluid readings and Furosemide do*   Lasix 80 bid and bmet one week  [name]

## 2015-06-13 NOTE — Telephone Encounter (Signed)
spoke to patients about resuts

## 2015-06-14 IMAGING — CR DG HUMERUS 2V *L*
2 series · 2 of 2 positions shown · non-contrast
Comparison: None.

CLINICAL DATA: Left upper arm deformity

EXAM:
LEFT HUMERUS - 2+ VIEW

[AP]
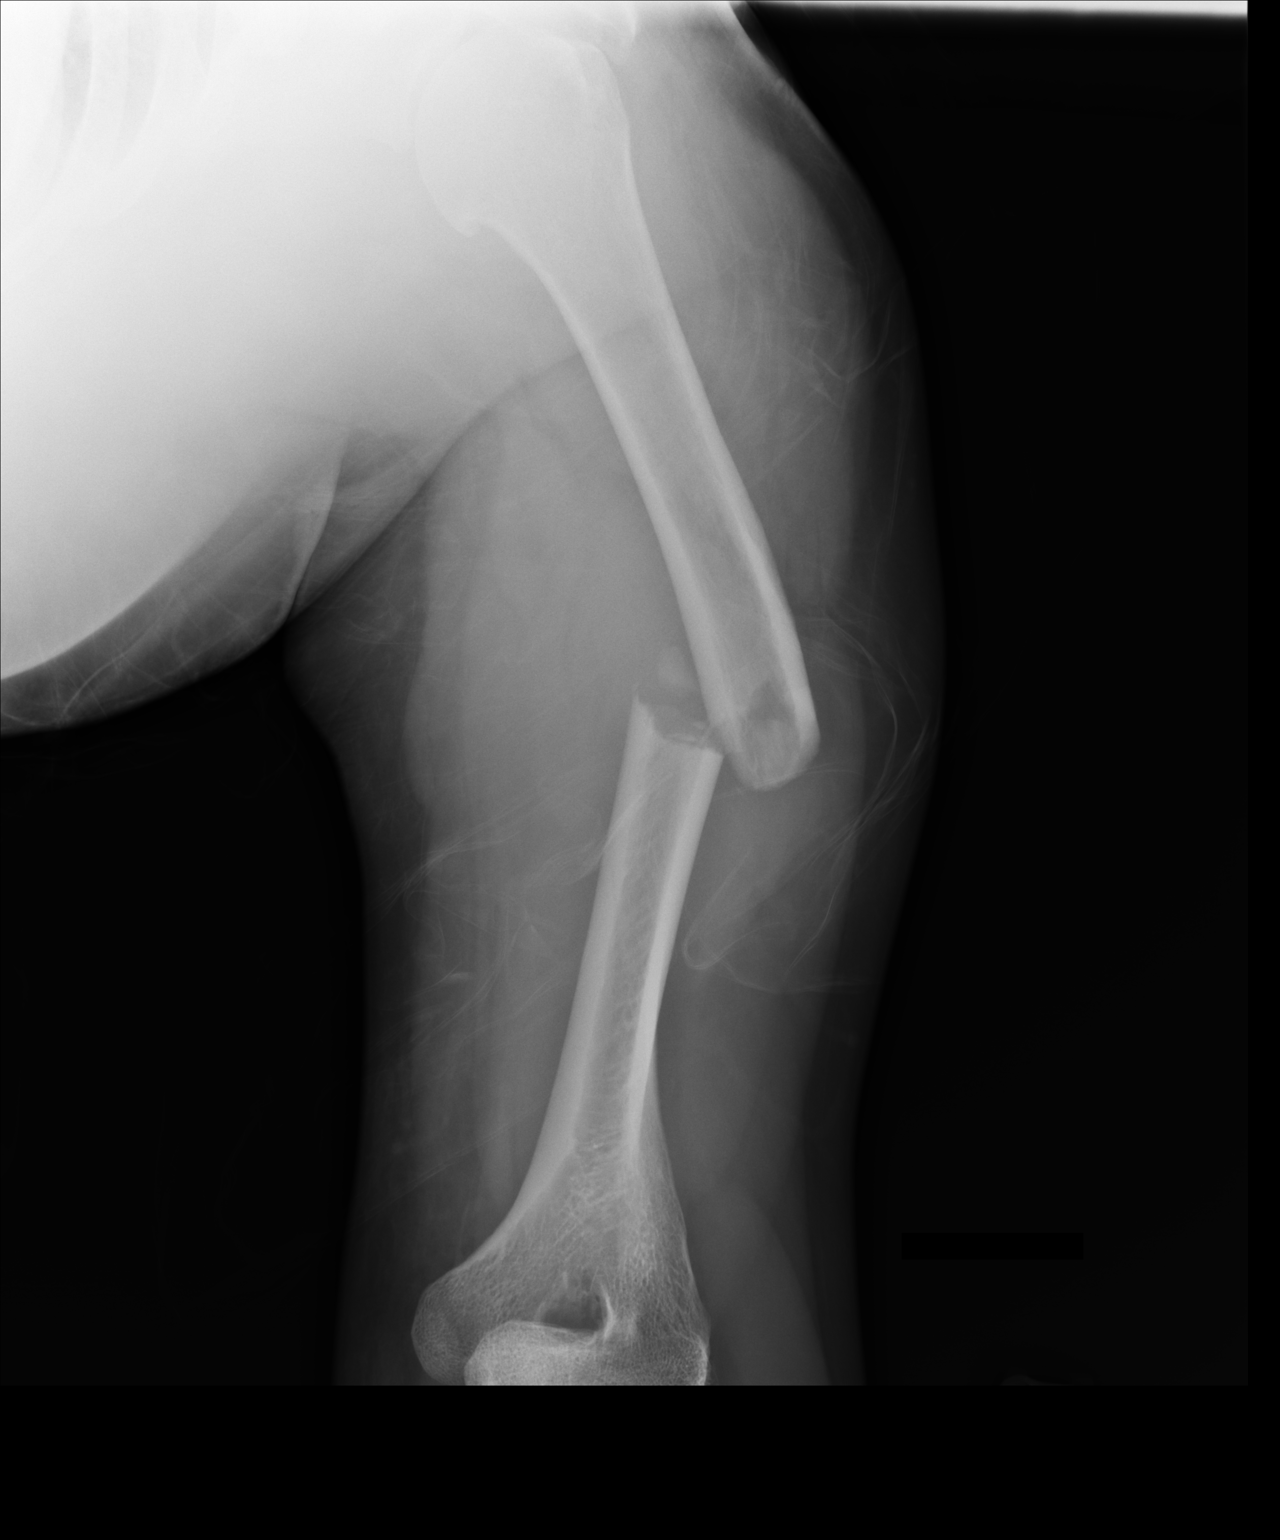

[lateral]
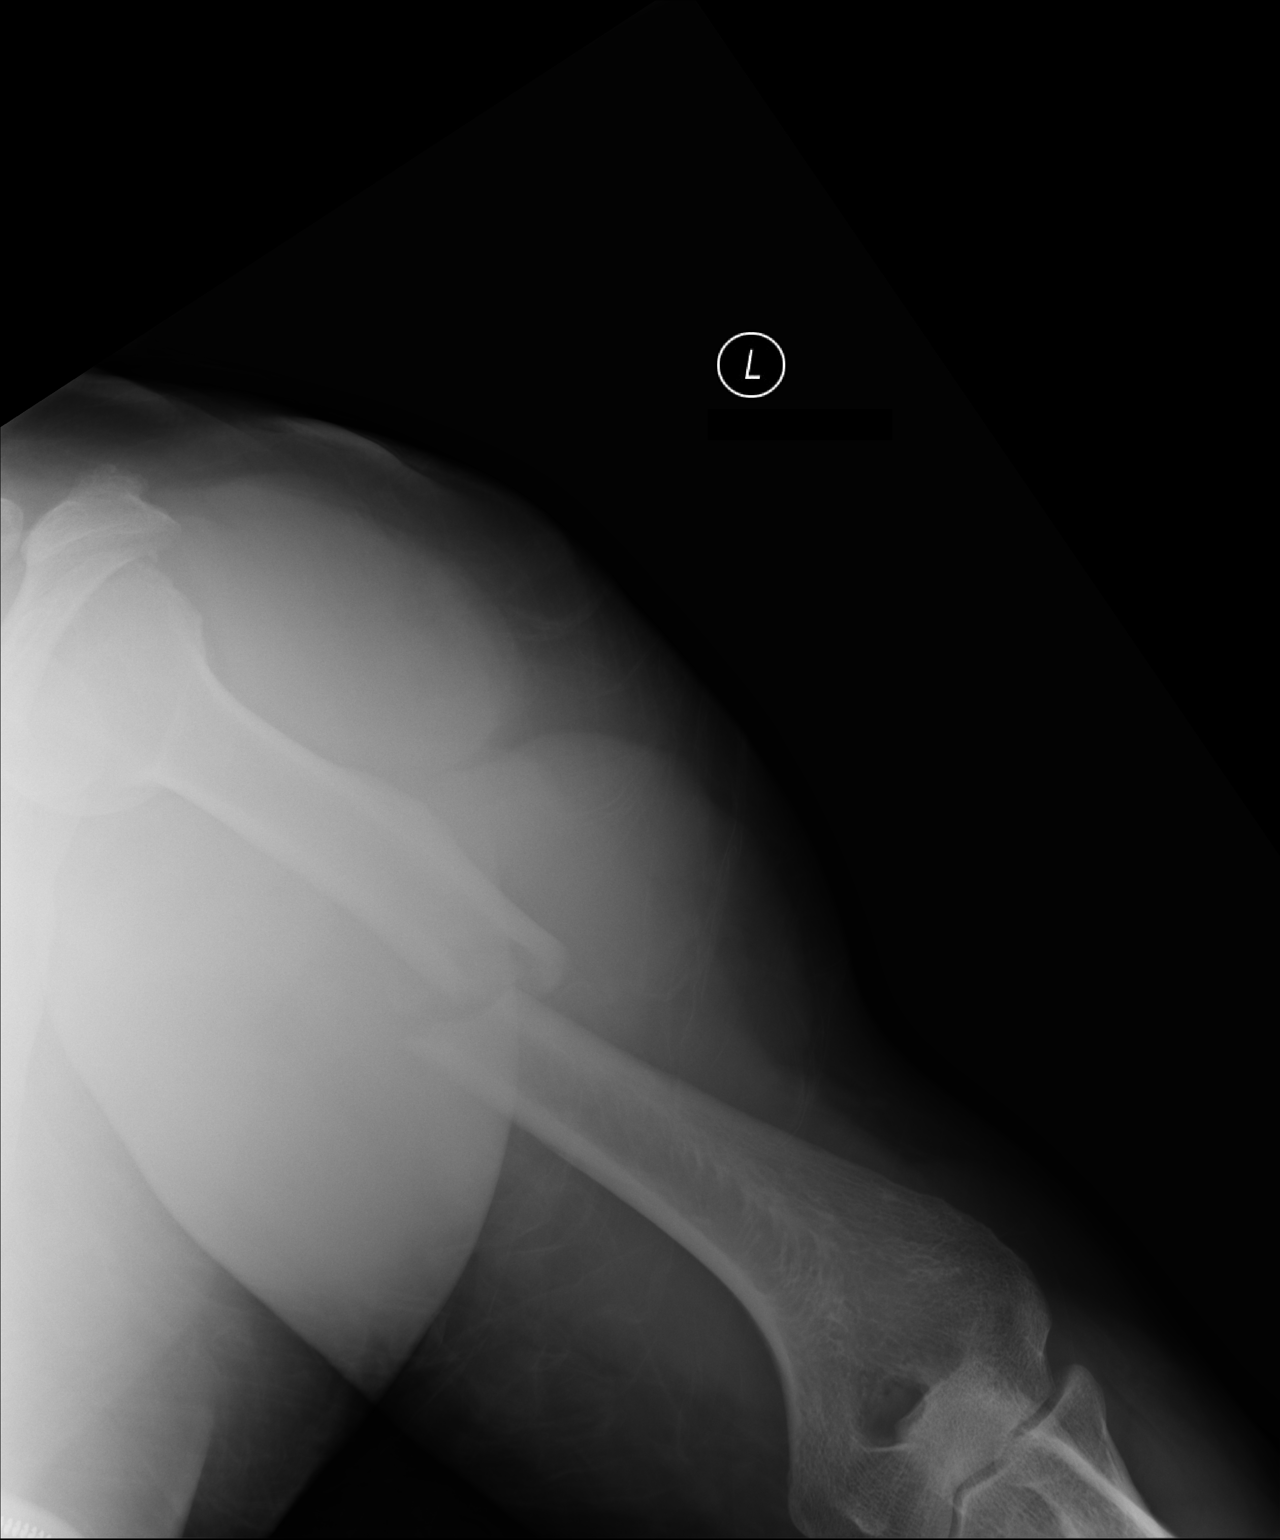

[2 of 2 positions shown; findings below may reference images not displayed]

FINDINGS: There is a horizontal fracture through the midshaft left humerus
with medial angulation of the distal fracture fragment. The distal
fracture fragment is displaced 1 bone with with 15 mm override.
IMPRESSION: 1. Horizontal fracture through the midshaft left humerus with 1 bone
width medial displacement of the distal fracture fragment.

## 2015-06-14 IMAGING — DX DG CHEST 1V PORT
1 series · 1 of 1 positions shown · non-contrast
Comparison: DG CHEST 2 VIEW dated 06/20/2013;

CLINICAL DATA: Chest pain.

EXAM:
PORTABLE CHEST - 1 VIEW

[portable]
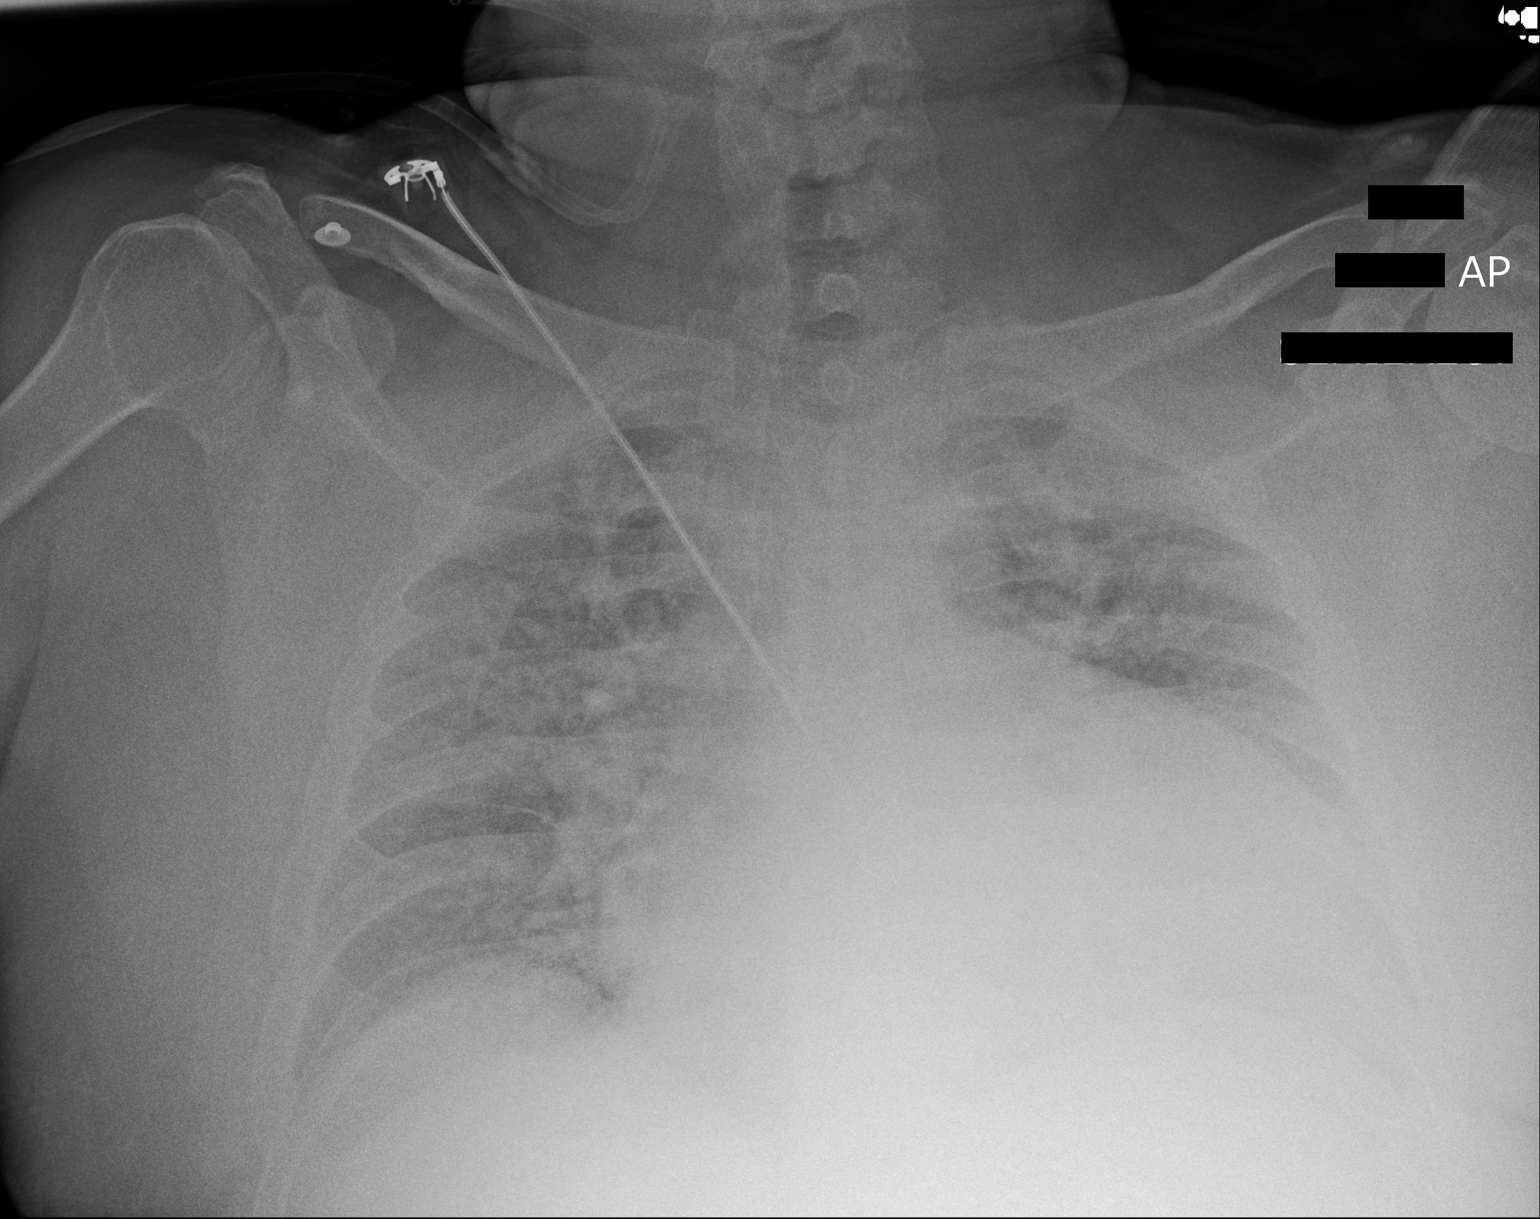

[1 of 1 positions shown; findings below may reference images not displayed]

DG CHEST 2 VIEW dated
01/03/2011; DG CHEST 2 VIEW dated 11/07/2008; CT ANGIO CHEST dated
05/08/2004
FINDINGS: Cardiac silhouette markedly enlarged but stable. Mild diffuse
interstitial pulmonary edema. Markedly suboptimal inspiration due to
body habitus with mild atelectasis in the lung bases.
IMPRESSION: 1. Mild CHF, with stable marked cardiomegaly and new mild diffuse
interstitial pulmonary edema.
2. Suboptimal inspiration accounts for mild bibasilar atelectasis.

## 2015-06-19 ENCOUNTER — Other Ambulatory Visit (INDEPENDENT_AMBULATORY_CARE_PROVIDER_SITE_OTHER): Payer: Medicaid Other | Admitting: *Deleted

## 2015-06-19 DIAGNOSIS — I5022 Chronic systolic (congestive) heart failure: Secondary | ICD-10-CM | POA: Diagnosis not present

## 2015-06-19 LAB — BASIC METABOLIC PANEL
BUN: 12 mg/dL (ref 7–25)
CO2: 26 mmol/L (ref 20–31)
CREATININE: 1.2 mg/dL (ref 0.70–1.33)
Calcium: 8.8 mg/dL (ref 8.6–10.3)
Chloride: 99 mmol/L (ref 98–110)
Glucose, Bld: 184 mg/dL — ABNORMAL HIGH (ref 65–99)
Potassium: 3.8 mmol/L (ref 3.5–5.3)
Sodium: 136 mmol/L (ref 135–146)

## 2015-06-19 NOTE — Addendum Note (Signed)
Addended by: Eulis Foster on: 06/19/2015 08:06 AM   Modules accepted: Orders

## 2015-06-27 ENCOUNTER — Ambulatory Visit (INDEPENDENT_AMBULATORY_CARE_PROVIDER_SITE_OTHER): Payer: Medicaid Other

## 2015-06-27 DIAGNOSIS — Z95810 Presence of automatic (implantable) cardiac defibrillator: Secondary | ICD-10-CM

## 2015-06-27 DIAGNOSIS — I5022 Chronic systolic (congestive) heart failure: Secondary | ICD-10-CM

## 2015-06-27 NOTE — Progress Notes (Signed)
No ICM remote transmission received as scheduled on 06/20/2015.  Will reschedule.

## 2015-06-27 NOTE — Progress Notes (Signed)
EPIC Encounter for ICM Monitoring  Patient Name: [name] is a 50 y.o. male Date: 06/27/2015 Primary Care Physican: Minerva Ends, MD Primary Cardiologist: Stanford Breed Electrophysiologist: Allred Dry Weight: 245 lb       In the past month, have you:  1. Gained more than 2 pounds in a day or more than 5 pounds in a week? Yes, 5 lb weight gain in last 2 days, since Monday 06/25/2015.    2. Had changes in your medications (with verification of current medications)? no  3. Had more shortness of breath than is usual for you? Yes  4. Limited your activity because of shortness of breath? no  5. Not been able to sleep because of shortness of breath? no  6. Had increased swelling in your feet or ankles? no  7. Had symptoms of dehydration (dizziness, dry mouth, increased thirst, decreased urine output) no  8. Had changes in sodium restriction? Does not follow low salt diet  9. Been compliant with medication? Yes   ICM trend: 3 month view 06/27/2015   ICM trend: 1 year view 06/27/2015    Follow-up plan: ICM clinic phone appointment 07/09/2015.  CORVUE: daily impedance below baseline starting today suggesting the beginning of fluid retention.  06/17/2015 to 06/26/2015 showed impedance above baseline.    SX: Weight gain, SOB DIET: He reported he is eating healthier but not necessarily low salt diet.  He does not salt food at the table.  Education given that most of the salt is now in every day foods and it is possible that if he has not reviewed food labels for sodium amount he may be getting more than daily recommended amount of 2000 mg.  Education given salt may cause fluid retention.   MEDS:  No changes and confirmed he has been taking Lasix 80 mg bid  Advised will send to Dr Stanford Breed and [name] for review and recommendations.    Copy of note sent to patient's primary care physician, primary cardiologist, and device following physician.  [name], RN, CCM 06/27/2015 9:48  AM

## 2015-06-28 NOTE — Progress Notes (Signed)
Spoke with patient and notified of Dr Jackalyn Lombard orders to increase Furosemide to 120 mg bid x 3 days and increase Potassium to 40 meq x 3 days, then resume the previous Furosemide dosage of 80 mg bid and Potassium 20 meq daily he was taking as prescribed by Dr Stanford Breed.  He verbalized understanding.   Reviewed remote ICM transmission with [name] in the office on 06/27/2015.  He recommended patient increase Furosemide to 120 mg bid x 3 days and increase Potassium to 40 meq x 3 days.  He is to resume prescribed dosage after 3 days.   Will repeat transmission 07/09/2015.

## 2015-07-06 ENCOUNTER — Ambulatory Visit (INDEPENDENT_AMBULATORY_CARE_PROVIDER_SITE_OTHER): Payer: Medicaid Other

## 2015-07-06 DIAGNOSIS — I5022 Chronic systolic (congestive) heart failure: Secondary | ICD-10-CM

## 2015-07-06 DIAGNOSIS — Z95810 Presence of automatic (implantable) cardiac defibrillator: Secondary | ICD-10-CM

## 2015-07-06 NOTE — Progress Notes (Signed)
Call to patient and notified that Dr Stanford Breed ordered to add Furosemide 40 mg x 2 days and then resume previous dosage.  Repeat transmission will be 07/11/2015.    Lelon Perla, MD  [name], RN            Take additonal lasix 40 mg 2 days and then resume present regimen  [name]

## 2015-07-06 NOTE — Progress Notes (Signed)
EPIC Encounter for ICM Monitoring  Patient Name: [name] is a 50 y.o. male Date: 07/06/2015 Primary Care Physican: Minerva Ends, MD Primary Cardiologist: Stanford Breed Electrophysiologist: Allred Dry Weight: 244 lbs       In the past month, have you:  1. Gained more than 2 pounds in a day or more than 5 pounds in a week? Yes, gained 4 lbs overnight.    2. Had changes in your medications (with verification of current medications)? no  3. Had more shortness of breath than is usual for you? Yes  4. Limited your activity because of shortness of breath? no  5. Not been able to sleep because of shortness of breath? no  6. Had increased swelling in your feet or ankles? no  7. Had symptoms of dehydration (dizziness, dry mouth, increased thirst, decreased urine output) no  8. Had changes in sodium restriction? Not following low salt diet  9. Been compliant with medication? Yes   ICM trend: 3 month view for 07/06/2015   ICM trend: 1 year view for 07/06/2015   Follow-up plan: ICM clinic phone appointment 07/11/2015.  Since last transmission on 06/27/2015, CorVue thoracic impedance below baseline 07/01/2015 to 07/02/2015 and today, 07/06/2015.     SX: Weight gain and SOB  Meds: Confirmed he increased Furosemide as ordered by [name] on 06/27/2015 x 3 days and weight dropped to 240 lbs .  He returned to prescribed dosage of Furosemide 80 mg bid after the 3 days.  He denied missing any dosages.  DIET:  Discussed what foods he ate in the last 2 days and he admitted to eating at [employer] yesterday, 07/06/2015.  Explained [employer] is very high in sodium and MSG which can cause fluid retention.   Reviewed some typical daily foods for sodium amounts such as breads, cheese, dairy, deli meats.  Encouraged to read every food label to determine exactly how much sodium he is eating every day and should limit to 2000 mg daily.  Advised that some drinks can have sodium and to  monitor that as well.  Education given to limit fluid intake to 64 oz a day and he currently is not sure how much fluids he drinks everyday.  Low Sodium Eating Plan and DASH Eating Plan reference material mailed to patient.   LABS: Last results on 06/19/2015 Creatinine 1.20 which is highest since 12/27/2014, BUN 12, and Potassium 3.8 (historically ranged from 3.4 to 4.1)  Advised would send to Dr Stanford Breed and [name] for review and call back with any recommendations.  Will repeat transmission on 07/11/2015.   Copy of note sent to patient's primary care physician, primary cardiologist, and device following physician.  [name], RN, CCM 07/06/2015 10:54 AM

## 2015-07-11 ENCOUNTER — Ambulatory Visit (INDEPENDENT_AMBULATORY_CARE_PROVIDER_SITE_OTHER): Payer: Medicaid Other

## 2015-07-11 ENCOUNTER — Telehealth: Payer: Self-pay | Admitting: Cardiology

## 2015-07-11 DIAGNOSIS — I5022 Chronic systolic (congestive) heart failure: Secondary | ICD-10-CM

## 2015-07-11 DIAGNOSIS — Z95810 Presence of automatic (implantable) cardiac defibrillator: Secondary | ICD-10-CM

## 2015-07-11 NOTE — Telephone Encounter (Signed)
Spoke with pt and reminded pt of remote transmission that is due today. Pt verbalized understanding.   

## 2015-07-12 NOTE — Progress Notes (Signed)
EPIC Encounter for ICM Monitoring  Patient Name: [name] is a 50 y.o. male Date: 07/12/2015 Primary Care Physican: Minerva Ends, MD Primary Cardiologist: Stanford Breed Electrophysiologist: Allred Dry Weight: 240 lbs       In the past month, have you:  1. Gained more than 2 pounds in a day or more than 5 pounds in a week? no  2. Had changes in your medications (with verification of current medications)? no  3. Had more shortness of breath than is usual for you? no  4. Limited your activity because of shortness of breath? no  5. Not been able to sleep because of shortness of breath? no  6. Had increased swelling in your feet or ankles? no  7. Had symptoms of dehydration (dizziness, dry mouth, increased thirst, decreased urine output) no  8. Had changes in sodium restriction? no  9. Been compliant with medication? Yes   ICM trend: 3 month view for 07/11/2015   ICM trend: 1 year view for 07/11/2015  Follow-up plan: ICM clinic phone appointment on 08/06/2015.  Corvue thoracic impedance above reference line after taking additional Lasix as ordered on 07/06/2015 x 2 day.   Thoracic impedance is starting to trend below baseline on transmission date, 07/11/2015.    SX:  Weight loss 4 lbs and no SOB since last ICM call 07/06/2015.  Reports fatigue today Diet: Reminded to follow low sodium diet Meds:  He resumed previous dosage of 80 mg bid  Patient has appointment with Dr Stanford Breed on 08/06/2015 and will repeat remote ICM transmission same day, 08/06/2015.  Copy of note sent to patient's primary care physician, primary cardiologist, and device following physician.  [name], RN, CCM 07/12/2015 9:31 AM

## 2015-07-17 ENCOUNTER — Telehealth: Payer: Self-pay

## 2015-07-17 DIAGNOSIS — I5022 Chronic systolic (congestive) heart failure: Secondary | ICD-10-CM

## 2015-07-17 MED ORDER — FUROSEMIDE 40 MG PO TABS: 80.0000 mg | ORAL_TABLET | Freq: Two times a day (BID) | ORAL | Status: DC

## 2015-07-17 NOTE — Telephone Encounter (Signed)
Call to patient and he reported he is out of Furosemide and requested a refill.  He would prefer to have 40 mg tablets and pharmacy is [place] [facility] [place] voice mail message from patient requesting refill of Furosemide.

## 2015-07-23 ENCOUNTER — Ambulatory Visit: Payer: Medicaid Other

## 2015-07-27 ENCOUNTER — Ambulatory Visit (INDEPENDENT_AMBULATORY_CARE_PROVIDER_SITE_OTHER): Payer: Medicaid Other

## 2015-07-27 DIAGNOSIS — I5022 Chronic systolic (congestive) heart failure: Secondary | ICD-10-CM

## 2015-07-27 DIAGNOSIS — Z95810 Presence of automatic (implantable) cardiac defibrillator: Secondary | ICD-10-CM

## 2015-07-27 NOTE — Progress Notes (Signed)
EPIC Encounter for ICM Monitoring  Patient Name: [name] is a 50 y.o. male Date: 07/27/2015 Primary Care Physican: Minerva Ends, MD Primary Cardiologist: Stanford Breed Electrophysiologist: Allred Dry Weight: Did not get weight today       In the past month, have you:  1. Gained more than 2 pounds in a day or more than 5 pounds in a week? unsure  2. Had changes in your medications (with verification of current medications)? no  3. Had more shortness of breath than is usual for you? no  4. Limited your activity because of shortness of breath? no  5. Not been able to sleep because of shortness of breath? no  6. Had increased swelling in your feet or ankles? no  7. Had symptoms of dehydration (dizziness, dry mouth, increased thirst, decreased urine output) no  8. Had changes in sodium restriction? no  9. Been compliant with medication? Yes   ICM trend: 3 month view for 07/27/2015   ICM trend: 1 year view for 07/27/2015   Follow-up plan: ICM clinic phone appointment 08/06/2015.  Received call from patient stating he thought his heart was fluttering and sent manual transmission.  He stated he feels dizziness and general malaise.  He denied any syncope episodes. Corvue thoracic impedance below reference line 07/11/2015 to 07/14/2015 and 07/18/2015 and 07/22/2015 suggesting fluid retention.  He denied any fluid symptoms during that time.  Thoracic impedance above reference line 07/23/2015 to 07/27/2015 suggesting dryness.  Reviewed remote transmission with [facility], Device team and no changes or episodes noted.  Asked if BP and BS readings have been within normal limits.  He stated his BS has been controlled and he does not take BP at home.  Encouraged him to increase the fluids to stay hydrated.  Encouraged him to call PCP if needed or use his emergency number if needed over the weekend.   He verbalized understanding.   Copy of note sent to patient's primary care physician, primary  cardiologist, and device following physician.  [name], RN, CCM 07/27/2015 10:47 AM

## 2015-08-03 NOTE — Progress Notes (Signed)
HPI: FU CM/CHF. Echo 2/15 revealed EF of 20-25% with diffuse hypokinesis and biatrial enlargement, mild MR and a small pericardial effusion. TSH in February 2015 normal. Cardiac catheterization in March of 2015 showed normal coronary arteries. Echocardiogram repeated in June 2016 and showed ejection fraction 25-30%, moderate left ventricular hypertrophy, grade 1 diastolic dysfunction and moderate left atrial enlargement. Since last seen, He notes dyspnea at times. He occasionally has chest pain. It lasts seconds. No pedal edema.  Current Outpatient Prescriptions  Medication Sig Dispense Refill  . ACCU-CHEK SOFTCLIX LANCETS lancets 1 each by Other route 3 (three) times daily. (Patient taking differently: 1 each by Other route See admin instructions. Check blood sugar once weekly) 100 each 12  . aspirin 81 MG tablet Take 81 mg by mouth daily.    . Blood Glucose Monitoring Suppl (ACCU-CHEK AVIVA PLUS) W/DEVICE KIT 1 Device by Does not apply route 3 (three) times daily after meals. (Patient taking differently: 1 Device by Does not apply route See admin instructions. Check blood sugar once weekly) 1 kit 0  . carvedilol (COREG) 25 MG tablet Take 1 tablet (25 mg total) by mouth 2 (two) times daily. 180 tablet 3  . digoxin (LANOXIN) 0.125 MG tablet Take 1 tablet (0.125 mg total) by mouth daily. 90 tablet 3  . furosemide (LASIX) 40 MG tablet Take 2 tablets (80 mg total) by mouth 2 (two) times daily. 360 tablet 3  . gabapentin (NEURONTIN) 300 MG capsule Take 1 capsule (300 mg total) by mouth at bedtime. 30 capsule 3  . glucose blood (ACCU-CHEK AVIVA PLUS) test strip 1 each by Other route 3 (three) times daily. (Patient taking differently: 1 each by Other route See admin instructions. Check blood sugar once weekly) 100 each 12  . Lancet Devices (ACCU-CHEK SOFTCLIX) lancets 1 each by Other route 3 (three) times daily. (Patient taking differently: 1 each by Other route See admin instructions. Check blood  sugar once weekly) 1 each 0  . metFORMIN (GLUCOPHAGE) 1000 MG tablet 1000 mg in morning, 1500 mg in PM 225 tablet 3  . pantoprazole (PROTONIX) 40 MG tablet Take 1 tablet (40 mg total) by mouth daily. 30 tablet 3  . potassium chloride SA (K-DUR,KLOR-CON) 20 MEQ tablet Take 1 tablet (20 mEq total) by mouth daily. 90 tablet 3  . sacubitril-valsartan (ENTRESTO) 24-26 MG Take 1 tablet by mouth 2 (two) times daily.    . sitaGLIPtin (JANUVIA) 100 MG tablet Take 1 tablet (100 mg total) by mouth daily. 90 tablet 3  . spironolactone (ALDACTONE) 25 MG tablet TAKE ONE TABLET BY MOUTH DAILY (Patient taking differently: Take 25 mg by mouth once daily) 90 tablet 2   No current facility-administered medications for this visit.     Past Medical History  Diagnosis Date  . Obesity   . 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 History of renal calculi   . Cardiomyopathy ([place])     a. 07/2013 Echo: EF 20-25%, diff HK, Gr2 DD, mild MR, mod dil LA/RA.  Marland Kitchen Fracture of left humerus     a. 07/2013.  Marland Kitchen Chronic bronchitis ([place])     "get it most q yr" (12/23/2013)  . Kidney stones   . GERD (gastroesophageal reflux disease)     not at present time  . Heart murmur     "born w/it"   . Obesity   . Chronic systolic dysfunction of left ventricle   . Syncope   .  Hypertension   . Type II diabetes mellitus ([place]) 2006  . CHF (congestive heart failure) ([place])   . 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 Cholecystectomy  ~ 2010  . Cardiac catheterization  08/10/13  . Ureteroscopy      "laser for kidney stones"  . Orif humerus fracture Left 08/12/2013    Procedure: OPEN REDUCTION INTERNAL FIXATION (ORIF) HUMERAL SHAFT FRACTURE;  Surgeon: Newt Minion, MD;  Location: [facility];  Service: Orthopedics;  Laterality: Left;  Open Reduction Internal Fixation Left Humerus  . Implantable cardioverter  defibrillator implant  12/23/2013    STJ Fortify ICD implanted by [name] for cardiomyopathy and syncope  . Left heart catheterization with coronary angiogram N/A 08/10/2013    Procedure: LEFT HEART CATHETERIZATION WITH CORONARY ANGIOGRAM;  Surgeon: Jettie Booze, MD;  Location: [facility] CATH LAB;  Service: Cardiovascular;  Laterality: N/A;  . Implantable cardioverter defibrillator implant N/A 12/23/2013    Procedure: IMPLANTABLE CARDIOVERTER DEFIBRILLATOR IMPLANT;  Surgeon: Coralyn Mark, MD;  Location[facility] CATH LAB;  Service: Cardiovascular;  Laterality: N/A;    Social History   Social History  . Marital Status: Single    Spouse Name: N/A  . Number of Children: N/A  . Years of Education: N/A   Occupational History  . Not on file.   Social History Main Topics  . Smoking status: Former Smoker -- 1.00 packs/day for 28 years    Types: Cigarettes    Quit date: 06/03/2014  . Smokeless tobacco: Never Used  . Alcohol Use: 0.0 oz/week    0 Standard drinks or equivalent per week     Comment: 12/23/2013 "might drink a couple times/yr"  . Drug Use: Yes    Special: Marijuana  . Sexual Activity: Yes   Other Topics Concern  . Not on file   Social History Narrative    Family History  Problem Relation Age of Onset  . Diabetes Father   . Arthritis Father   . Diabetes Mother   . Arthritis Mother   . Hypertension Brother     ROS: no fevers or chills, productive cough, hemoptysis, dysphasia, odynophagia, melena, hematochezia, dysuria, hematuria, rash, seizure activity, orthopnea, PND, pedal edema, claudication. Remaining systems are negative.  Physical Exam: Well-developed well-nourished in no acute distress.  Skin is warm and dry.  HEENT is normal.  Neck is supple.  Chest is clear to auscultation with normal expansion.  Cardiovascular exam is regular rate and rhythm.  Abdominal exam nontender or distended. No masses palpated. Extremities show no edema. neuro grossly intact  ECG  Sinus rhythm at a rate of 78. Cannot rule out prior inferior infarct.

## 2015-08-06 ENCOUNTER — Ambulatory Visit (INDEPENDENT_AMBULATORY_CARE_PROVIDER_SITE_OTHER): Payer: Medicaid Other | Admitting: Cardiology

## 2015-08-06 ENCOUNTER — Encounter: Payer: Self-pay | Admitting: Cardiology

## 2015-08-06 ENCOUNTER — Ambulatory Visit (INDEPENDENT_AMBULATORY_CARE_PROVIDER_SITE_OTHER): Payer: Medicaid Other | Admitting: *Deleted

## 2015-08-06 VITALS — BP 110/68 | HR 78 | Ht 72.0 in | Wt 244.0 lb

## 2015-08-06 DIAGNOSIS — I5022 Chronic systolic (congestive) heart failure: Secondary | ICD-10-CM | POA: Diagnosis not present

## 2015-08-06 DIAGNOSIS — R079 Chest pain, unspecified: Secondary | ICD-10-CM

## 2015-08-06 DIAGNOSIS — I428 Other cardiomyopathies: Secondary | ICD-10-CM

## 2015-08-06 DIAGNOSIS — I429 Cardiomyopathy, unspecified: Secondary | ICD-10-CM | POA: Diagnosis not present

## 2015-08-06 DIAGNOSIS — G8929 Other chronic pain: Secondary | ICD-10-CM

## 2015-08-06 DIAGNOSIS — Z95810 Presence of automatic (implantable) cardiac defibrillator: Secondary | ICD-10-CM

## 2015-08-06 LAB — CUP PACEART REMOTE DEVICE CHECK
Battery Voltage: 3.04 V
Date Time Interrogation Session: 20170227070021
HIGH POWER IMPEDANCE MEASURED VALUE: 79 Ohm
HIGH POWER IMPEDANCE MEASURED VALUE: 79 Ohm
HighPow Impedance: 79 Ohm
HighPow Impedance: 79 Ohm
Implantable Lead Implant Date: 20150717
Implantable Lead Location: 753860
Lead Channel Impedance Value: 360 Ohm
Lead Channel Pacing Threshold Pulse Width: 0.5 ms
Lead Channel Sensing Intrinsic Amplitude: 12 mV
Lead Channel Setting Pacing Amplitude: 2.5 V
Lead Channel Setting Pacing Pulse Width: 0.5 ms
Lead Channel Setting Pacing Pulse Width: 0.5 ms
Lead Channel Setting Sensing Sensitivity: 0.5 mV
MDC IDC LEAD IMPLANT DT: 20150717
MDC IDC LEAD LOCATION: 753860
MDC IDC MSMT BATTERY REMAINING LONGEVITY: 90 mo
MDC IDC MSMT BATTERY REMAINING LONGEVITY: 90 mo
MDC IDC MSMT BATTERY REMAINING PERCENTAGE: 85 %
MDC IDC MSMT BATTERY REMAINING PERCENTAGE: 85 %
MDC IDC MSMT BATTERY VOLTAGE: 3.04 V
MDC IDC MSMT LEADCHNL RV IMPEDANCE VALUE: 360 Ohm
MDC IDC MSMT LEADCHNL RV PACING THRESHOLD AMPLITUDE: 0.75 V
MDC IDC MSMT LEADCHNL RV PACING THRESHOLD AMPLITUDE: 0.75 V
MDC IDC MSMT LEADCHNL RV PACING THRESHOLD PULSEWIDTH: 0.5 ms
MDC IDC MSMT LEADCHNL RV SENSING INTR AMPL: 12 mV
MDC IDC SESS DTM: 20170227070021
MDC IDC SET LEADCHNL RV PACING AMPLITUDE: 2.5 V
MDC IDC SET LEADCHNL RV SENSING SENSITIVITY: 0.5 mV
MDC IDC STAT BRADY RV PERCENT PACED: 1 %
MDC IDC STAT BRADY RV PERCENT PACED: 1 %
Pulse Gen Serial Number: 7196009
Pulse Gen Serial Number: 7196009

## 2015-08-06 NOTE — Assessment & Plan Note (Signed)
Followed by electrophysiology. 

## 2015-08-06 NOTE — Assessment & Plan Note (Addendum)
Euvolemic on examination. Continue present dose of Lasix. Continue low-sodium diet and daily weights. He will take an additional 40 mg of Lasix for weight gain of 2-3 pounds. Check potassium and renal function.

## 2015-08-06 NOTE — Patient Instructions (Signed)
Medication Instructions:   NO CHANGE  Labwork:  Your physician recommends that you HAVE LAB WORK TODAY  Follow-Up:  Your physician recommends that you schedule a follow-up appointment in: 3 MONTHS WITH DR CRENSHAW      

## 2015-08-06 NOTE — Assessment & Plan Note (Signed)
No plans for further ischemia evaluation.

## 2015-08-06 NOTE — Progress Notes (Signed)
Remote ICD transmission.   

## 2015-08-06 NOTE — Assessment & Plan Note (Signed)
Continue carvedilol and entresto.

## 2015-08-07 LAB — BASIC METABOLIC PANEL
BUN: 14 mg/dL (ref 7–25)
CHLORIDE: 96 mmol/L — AB (ref 98–110)
CO2: 27 mmol/L (ref 20–31)
CREATININE: 1.34 mg/dL — AB (ref 0.70–1.33)
Calcium: 9.3 mg/dL (ref 8.6–10.3)
GLUCOSE: 129 mg/dL — AB (ref 65–99)
POTASSIUM: 3.7 mmol/L (ref 3.5–5.3)
Sodium: 140 mmol/L (ref 135–146)

## 2015-08-08 NOTE — Progress Notes (Signed)
EPIC Encounter for ICM Monitoring  Patient Name: [name] is a 50 y.o. male Date: 08/08/2015 Primary Care Physican: Minerva Ends, MD Primary Cardiologist: Stanford Breed Electrophysiologist: Allred Dry Weight: 239 lbs      In the past month, have you:  1. Gained more than 2 pounds in a day or more than 5 pounds in a week? no  2. Had changes in your medications (with verification of current medications)? no  3. Had more shortness of breath than is usual for you? no  4. Limited your activity because of shortness of breath? no  5. Not been able to sleep because of shortness of breath? no  6. Had increased swelling in your feet or ankles? no  7. Had symptoms of dehydration (dizziness, dry mouth, increased thirst, decreased urine output) no  8. Had changes in sodium restriction? no  9. Been compliant with medication? Yes   ICM trend: 3 month view for 08/06/2015  ICM trend: 1 year view for 08/06/2015   Follow-up plan: ICM clinic phone appointment on 09/10/2015.  Corvue thoracic impedance below reference line 07/29/2015 to 08/05/2015 suggesting fluid accumulation.  He denied any fluid symptoms.  He visited Dr Stanford Breed on 08/06/2015 and can take extra 40 mg Furosemide for weight gain and denied taking any extra in the last week.  He stated he is feeling fine at this time.  No changes today.    Copy of note sent to patient's primary care physician, primary cardiologist, and device following physician.  [name], RN, CCM 08/08/2015 12:39 PM

## 2015-08-09 LAB — HM DIABETES EYE EXAM

## 2015-08-16 ENCOUNTER — Other Ambulatory Visit: Payer: Self-pay | Admitting: Ophthalmology

## 2015-08-24 ENCOUNTER — Other Ambulatory Visit: Payer: Self-pay | Admitting: Cardiology

## 2015-08-24 NOTE — Telephone Encounter (Signed)
Patient calling the office for samples of medication:   1.  What medication and dosage are you requesting samples for? Entresto  2.  Are you currently out of this medication? No,enough until tomorrow

## 2015-08-24 NOTE — Telephone Encounter (Signed)
SPOKE TO PATIENT PATIENT STATES HE HAS BEEN APPROVED BUT MEDICAID WILL  PAY  RN SPOKE TO [facility] TRACK-  MEDICATION HAS BEEN APPROVED UNTIL 10 /2017 RN CONNECTED [facility] - PRESCRIPTION  AVAILABLE - IT HAS NOT BEEN FILED YET   RN INSTRUCTED PHARMACY TO FILL ENTRESTO  COST OF $3  PHARMACIST STATES MEDICATION HAS TO BE ORDER - WILL BE READY ON Monday.   RN CALLED PATIENT - INFORMED PATIENT MEDICATION IS AVAILABLE TO HIM TO PURCHASE  ENTRESTO  ON Monday. PATIENT VERBALIZED UNDERSTANDING.

## 2015-08-28 LAB — CUP PACEART REMOTE DEVICE CHECK
Battery Remaining Percentage: 85 %
Date Time Interrogation Session: 20170227070021
HIGH POWER IMPEDANCE MEASURED VALUE: 79 Ohm
HighPow Impedance: 79 Ohm
Implantable Lead Implant Date: 20150717
Implantable Lead Location: 753860
Lead Channel Impedance Value: 360 Ohm
Lead Channel Pacing Threshold Pulse Width: 0.5 ms
Lead Channel Setting Pacing Pulse Width: 0.5 ms
Lead Channel Setting Sensing Sensitivity: 0.5 mV
MDC IDC MSMT BATTERY REMAINING LONGEVITY: 90 mo
MDC IDC MSMT BATTERY VOLTAGE: 3.04 V
MDC IDC MSMT LEADCHNL RV PACING THRESHOLD AMPLITUDE: 0.75 V
MDC IDC MSMT LEADCHNL RV SENSING INTR AMPL: 12 mV
MDC IDC SET LEADCHNL RV PACING AMPLITUDE: 2.5 V
MDC IDC STAT BRADY RV PERCENT PACED: 1 %
Pulse Gen Serial Number: 7196009

## 2015-08-29 ENCOUNTER — Ambulatory Visit (INDEPENDENT_AMBULATORY_CARE_PROVIDER_SITE_OTHER): Payer: Medicaid Other | Admitting: Podiatry

## 2015-08-29 ENCOUNTER — Encounter: Payer: Self-pay | Admitting: Podiatry

## 2015-08-29 DIAGNOSIS — L309 Dermatitis, unspecified: Secondary | ICD-10-CM

## 2015-08-29 DIAGNOSIS — B351 Tinea unguium: Secondary | ICD-10-CM | POA: Diagnosis not present

## 2015-08-30 NOTE — Progress Notes (Signed)
Subjective:     Patient ID: [name], male   DOB: 10-17-1965, 50 y.o.   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

## 2015-09-10 ENCOUNTER — Telehealth: Payer: Self-pay | Admitting: Cardiology

## 2015-09-10 ENCOUNTER — Telehealth: Payer: Self-pay

## 2015-09-10 NOTE — Telephone Encounter (Signed)
Attempted reminder call to patient to request to send ICM remote transmission today and line busy.

## 2015-09-10 NOTE — Telephone Encounter (Signed)
Attempted to confirm remote transmission with pt. No answer and was unable to leave a message.   

## 2015-09-12 NOTE — Telephone Encounter (Signed)
No ICM remote transmission received.  Scheduled next ICM remote transmission for 09/21/2015.

## 2015-09-13 ENCOUNTER — Telehealth: Payer: Self-pay

## 2015-09-13 ENCOUNTER — Other Ambulatory Visit: Payer: Self-pay | Admitting: *Deleted

## 2015-09-13 ENCOUNTER — Ambulatory Visit (INDEPENDENT_AMBULATORY_CARE_PROVIDER_SITE_OTHER): Payer: Medicaid Other

## 2015-09-13 DIAGNOSIS — I5022 Chronic systolic (congestive) heart failure: Secondary | ICD-10-CM

## 2015-09-13 DIAGNOSIS — Z95810 Presence of automatic (implantable) cardiac defibrillator: Secondary | ICD-10-CM

## 2015-09-13 DIAGNOSIS — K219 Gastro-esophageal reflux disease without esophagitis: Secondary | ICD-10-CM

## 2015-09-13 MED ORDER — PANTOPRAZOLE SODIUM 40 MG PO TBEC: 40.0000 mg | DELAYED_RELEASE_TABLET | Freq: Every day | ORAL | Status: DC

## 2015-09-13 NOTE — Progress Notes (Signed)
EPIC Encounter for ICM Monitoring  Patient Name: [name] is a 50 y.o. male Date: 09/13/2015 Primary Care Physican: Minerva Ends, MD Primary Cardiologist: Stanford Breed Electrophysiologist: Allred Dry Weight: unknown      In the past month, have you:  1. Gained more than 2 pounds in a day or more than 5 pounds in a week? N/A  2. Had changes in your medications (with verification of current medications)? N/A  3. Had more shortness of breath than is usual for you? N/A  4. Limited your activity because of shortness of breath? N/A  5. Not been able to sleep because of shortness of breath? N/A  6. Had increased swelling in your feet or ankles? N/A  7. Had symptoms of dehydration (dizziness, dry mouth, increased thirst, decreased urine output) N/A  8. Had changes in sodium restriction? N/A  9. Been compliant with medication? N/A   ICM trend: 3 month view for 09/12/2015   ICM trend: 1 year view for 09/12/2015   Follow-up plan: ICM clinic phone appointment on 09/21/2015.   Attempted patient call and unable to reach.  Transmission reviewed.  Thoracic impedance below reference line starting 09/06/2015 to transmission date 09/12/2015 suggesting fluid accumulation.    Unable to reach patient.  Repeat transmission 09/21/2015  [name], RN, CCM 09/13/2015 11:20 AM

## 2015-09-13 NOTE — Telephone Encounter (Signed)
Remote ICM transmission received.  Attempted patient call and left message for return call.   

## 2015-09-14 ENCOUNTER — Encounter: Payer: Self-pay | Admitting: Family Medicine

## 2015-09-14 ENCOUNTER — Ambulatory Visit: Payer: Medicaid Other | Attending: Family Medicine | Admitting: Family Medicine

## 2015-09-14 VITALS — BP 107/75 | HR 74 | Temp 97.9°F | Resp 16 | Ht 73.0 in | Wt 245.0 lb

## 2015-09-14 DIAGNOSIS — Z7982 Long term (current) use of aspirin: Secondary | ICD-10-CM | POA: Diagnosis not present

## 2015-09-14 DIAGNOSIS — J069 Acute upper respiratory infection, unspecified: Secondary | ICD-10-CM

## 2015-09-14 DIAGNOSIS — L309 Dermatitis, unspecified: Secondary | ICD-10-CM

## 2015-09-14 DIAGNOSIS — Z794 Long term (current) use of insulin: Secondary | ICD-10-CM

## 2015-09-14 DIAGNOSIS — R05 Cough: Secondary | ICD-10-CM | POA: Diagnosis not present

## 2015-09-14 DIAGNOSIS — Z7984 Long term (current) use of oral hypoglycemic drugs: Secondary | ICD-10-CM | POA: Insufficient documentation

## 2015-09-14 DIAGNOSIS — Z87891 Personal history of nicotine dependence: Secondary | ICD-10-CM | POA: Insufficient documentation

## 2015-09-14 DIAGNOSIS — E118 Type 2 diabetes mellitus with unspecified complications: Secondary | ICD-10-CM | POA: Diagnosis not present

## 2015-09-14 LAB — POCT GLYCOSYLATED HEMOGLOBIN (HGB A1C): HEMOGLOBIN A1C: 7.8

## 2015-09-14 LAB — GLUCOSE, POCT (MANUAL RESULT ENTRY): POC GLUCOSE: 144 mg/dL — AB (ref 70–99)

## 2015-09-14 MED ORDER — AMOXICILLIN 875 MG PO TABS: 875.0000 mg | ORAL_TABLET | Freq: Two times a day (BID) | ORAL | Status: DC

## 2015-09-14 MED ORDER — TRIAMCINOLONE ACETONIDE 0.1 % EX CREA: 1.0000 "application " | TOPICAL_CREAM | Freq: Two times a day (BID) | CUTANEOUS | Status: DC

## 2015-09-14 MED ORDER — METFORMIN HCL 1000 MG PO TABS: ORAL_TABLET | ORAL | Status: DC

## 2015-09-14 MED ORDER — GUAIFENESIN-CODEINE 100-10 MG/5ML PO SYRP: 5.0000 mL | ORAL_SOLUTION | Freq: Three times a day (TID) | ORAL | Status: DC | PRN

## 2015-09-14 MED ORDER — SITAGLIPTIN PHOSPHATE 100 MG PO TABS: 100.0000 mg | ORAL_TABLET | Freq: Every day | ORAL | Status: DC

## 2015-09-14 NOTE — Telephone Encounter (Signed)
Received voice mail from patient stating he was returning the call.  Attempted call and left message for return call.

## 2015-09-14 NOTE — Progress Notes (Signed)
Patient ID: [name], male   DOB: 13-Oct-1965, 50 y.o.   MRN: [id number]   Subjective:  Patient ID: [name], male    DOB: 03-07-1966  Age: 50 y.o. MRN: [id number]  CC: No chief complaint on file.   HPI [name] presents for    1. CHRONIC DIABETES  Disease Monitoring  Blood Sugar Ranges: checking 1-2 times per week, sometimes in AM, sometimes in midday, 180-190. Sometimes fasting.   Polyuria: yes   Visual problems: no   Medication Compliance: yes  Medication Side Effects  Hypoglycemia: no   Preventitive Health Care  Eye Exam: due   Foot Exam: due   Diet pattern: skips meals   Exercise: walks daily   2. Cough: started 3 days ago. He has congestion. No CP or SOB. No fever. Slight sore throat. Requesting refill of cough medicine. He took two doses of an antibiotic that he had at home.    3. Rash: on trunk. Dry patches. Slightly itchy. Has treated with neosporin at home.   Social History  Substance Use Topics  . Smoking status: Former Smoker -- 1.00 packs/day for 28 years    Types: Cigarettes    Quit date: 06/03/2014  . Smokeless tobacco: Never Used  . Alcohol Use: 0.0 oz/week    0 Standard drinks or equivalent per week     Comment: 12/23/2013 "might drink a couple times/yr"   Outpatient Prescriptions Prior to Visit  Medication Sig Dispense Refill  . ACCU-CHEK SOFTCLIX LANCETS lancets 1 each by Other route 3 (three) times daily. (Patient taking differently: 1 each by Other route See admin instructions. Check blood sugar once weekly) 100 each 12  . aspirin 81 MG tablet Take 81 mg by mouth daily.    . Blood Glucose Monitoring Suppl (ACCU-CHEK AVIVA PLUS) W/DEVICE KIT 1 Device by Does not apply route 3 (three) times daily after meals. (Patient taking differently: 1 Device by Does not apply route See admin instructions. Check blood sugar once weekly) 1 kit 0  . carvedilol (COREG) 25 MG tablet Take 1 tablet (25 mg total) by mouth 2 (two) times daily. 180 tablet 3    . digoxin (LANOXIN) 0.125 MG tablet Take 1 tablet (0.125 mg total) by mouth daily. 90 tablet 3  . furosemide (LASIX) 40 MG tablet Take 2 tablets (80 mg total) by mouth 2 (two) times daily. 360 tablet 3  . gabapentin (NEURONTIN) 300 MG capsule Take 1 capsule (300 mg total) by mouth at bedtime. 30 capsule 3  . glucose blood (ACCU-CHEK AVIVA PLUS) test strip 1 each by Other route 3 (three) times daily. (Patient taking differently: 1 each by Other route See admin instructions. Check blood sugar once weekly) 100 each 12  . Lancet Devices (ACCU-CHEK SOFTCLIX) lancets 1 each by Other route 3 (three) times daily. (Patient taking differently: 1 each by Other route See admin instructions. Check blood sugar once weekly) 1 each 0  . metFORMIN (GLUCOPHAGE) 1000 MG tablet 1000 mg in morning, 1500 mg in PM 225 tablet 3  . potassium chloride SA (K-DUR,KLOR-CON) 20 MEQ tablet Take 1 tablet (20 mEq total) by mouth daily. 90 tablet 3  . sacubitril-valsartan (ENTRESTO) 24-26 MG Take 1 tablet by mouth 2 (two) times daily.    . sitaGLIPtin (JANUVIA) 100 MG tablet Take 1 tablet (100 mg total) by mouth daily. 90 tablet 3  . spironolactone (ALDACTONE) 25 MG tablet TAKE ONE TABLET BY MOUTH DAILY (Patient taking differently: Take 25 mg by  mouth once daily) 90 tablet 2  . pantoprazole (PROTONIX) 40 MG tablet Take 1 tablet (40 mg total) by mouth daily. (Patient not taking: Reported on 09/14/2015) 30 tablet 3   No facility-administered medications prior to visit.    ROS Review of Systems  Constitutional: Negative for fever, chills, fatigue and unexpected weight change.  HENT: Positive for congestion and sore throat.   Eyes: Negative for visual disturbance.  Respiratory: Positive for cough. Negative for shortness of breath.   Cardiovascular: Negative for chest pain, palpitations and leg swelling.  Gastrointestinal: Negative for nausea, vomiting, abdominal pain, diarrhea, constipation and blood in stool.  Endocrine: Negative  for polydipsia, polyphagia and polyuria.  Musculoskeletal: Negative for myalgias, back pain, arthralgias, gait problem and neck pain.  Skin: Positive for rash.  Allergic/Immunologic: Negative for immunocompromised state.  Hematological: Negative for adenopathy. Does not bruise/bleed easily.  Psychiatric/Behavioral: Negative for suicidal ideas, sleep disturbance and dysphoric mood. The patient is not nervous/anxious.     Objective:  BP 107/75 mmHg  Pulse 74  Temp(Src) 97.9 F (36.6 C) (Oral)  Resp 16  Ht _0  (1.854 m)  Wt 245 lb (111.131 kg)  BMI 32.33 kg/m2  SpO2 96%  BP/Weight 09/14/2015 08/06/2015 85/11/3147  Systolic BP 702 637 858  Diastolic BP 75 68 74  Wt. (Lbs) 245 244 264  BMI 32.33 33.09 34.84   Physical Exam  Constitutional: He appears well-developed and well-nourished. No distress.  HENT:  Head: Normocephalic and atraumatic.  Nose: Mucosal edema present.  Mouth/Throat: Oropharynx is clear and moist and mucous membranes are normal.  Eyes: Conjunctivae are normal. Pupils are equal, round, and reactive to light.  Neck: Normal range of motion. Neck supple.  Cardiovascular: Normal rate, regular rhythm, normal heart sounds and intact distal pulses.   Pulmonary/Chest: Effort normal and breath sounds normal.  Musculoskeletal: He exhibits no edema.  Lymphadenopathy:    He has no cervical adenopathy.  Neurological: He is alert.  Skin: Skin is warm and dry. Rash (papular rash on anterior shoulders ) noted. No erythema.     Psychiatric: He has a normal mood and affect.   Lab Results  Component Value Date   HGBA1C 10.2* 04/01/2015   Lab Results  Component Value Date   HGBA1C 7.8 09/14/2015     Assessment & Plan:   Problem List Items Addressed This Visit    None    Visit Diagnoses    Controlled type 2 diabetes mellitus with complication, with long-term current use of insulin ([place])    -  Primary    Relevant Medications    metFORMIN (GLUCOPHAGE) 1000 MG tablet      sitaGLIPtin (JANUVIA) 100 MG tablet    Other Relevant Orders    Glucose (CBG) (Completed)    HgB A1c (Completed)    Acute upper respiratory infection        Relevant Medications    amoxicillin (AMOXIL) 875 MG tablet    guaiFENesin-codeine (ROBITUSSIN AC) 100-10 MG/5ML syrup    Dermatitis        Relevant Medications    triamcinolone cream (KENALOG) 0.1 %       No orders of the defined types were placed in this encounter.    Follow-up: No Follow-up on file.   Boykin Nearing MD

## 2015-09-14 NOTE — Patient Instructions (Addendum)
[name] was seen today for diabetes.  Diagnoses and all orders for this visit:  Controlled type 2 diabetes mellitus with complication, with long-term current use of insulin ([facility]) -     Glucose (CBG) -     HgB A1c -     metFORMIN (GLUCOPHAGE) 1000 MG tablet; 1000 mg in morning, 1500 mg in PM -     sitaGLIPtin (JANUVIA) 100 MG tablet; Take 1 tablet (100 mg total) by mouth daily.  Acute upper respiratory infection -     amoxicillin (AMOXIL) 875 MG tablet; Take 1 tablet (875 mg total) by mouth 2 (two) times daily. -     guaiFENesin-codeine (ROBITUSSIN AC) 100-10 MG/5ML syrup; Take 5 mLs by mouth 3 (three) times daily as needed for cough.  Dermatitis -     triamcinolone cream (KENALOG) 0.1 %; Apply 1 application topically 2 (two) times daily.   Keep cardiology follow up   F/u with me in 6 months for diabetes   Dr. Adrian Blackwater

## 2015-09-14 NOTE — Progress Notes (Signed)
F/U DM  Black spot on rt side abdomen  No pain today  Glucose running 117-210 No tobacco user  No suicidal thoughts in the past two weeks

## 2015-09-17 ENCOUNTER — Encounter: Payer: Self-pay | Admitting: Family Medicine

## 2015-09-17 ENCOUNTER — Encounter: Payer: Self-pay | Admitting: Clinical

## 2015-09-17 ENCOUNTER — Telehealth: Payer: Self-pay

## 2015-09-17 DIAGNOSIS — J069 Acute upper respiratory infection, unspecified: Secondary | ICD-10-CM | POA: Insufficient documentation

## 2015-09-17 DIAGNOSIS — Z794 Long term (current) use of insulin: Secondary | ICD-10-CM

## 2015-09-17 DIAGNOSIS — E785 Hyperlipidemia, unspecified: Secondary | ICD-10-CM | POA: Insufficient documentation

## 2015-09-17 DIAGNOSIS — E118 Type 2 diabetes mellitus with unspecified complications: Secondary | ICD-10-CM | POA: Insufficient documentation

## 2015-09-17 DIAGNOSIS — L309 Dermatitis, unspecified: Secondary | ICD-10-CM | POA: Insufficient documentation

## 2015-09-17 DIAGNOSIS — I428 Other cardiomyopathies: Secondary | ICD-10-CM

## 2015-09-17 DIAGNOSIS — E1169 Type 2 diabetes mellitus with other specified complication: Secondary | ICD-10-CM | POA: Insufficient documentation

## 2015-09-17 DIAGNOSIS — E119 Type 2 diabetes mellitus without complications: Secondary | ICD-10-CM | POA: Insufficient documentation

## 2015-09-17 DIAGNOSIS — E1159 Type 2 diabetes mellitus with other circulatory complications: Secondary | ICD-10-CM | POA: Insufficient documentation

## 2015-09-17 NOTE — Assessment & Plan Note (Signed)
Acute URI Patient has taking two doses of Abx at home  Plan: Complete course of amox Refilled cough medicine

## 2015-09-17 NOTE — Telephone Encounter (Signed)
Returned call to patient as requested.  Patient needs digoxin refill.  Confirmed dosage and Product/process development scientist on [employer].  Advised will send to refill department.   He stated he woke up this morning with racing heart and wondered if that was something to do with his device.  Advised no alerts today.  He reported he has had a racing heart before and Dr Stanford Breed is aware of it.  Advised if increased heart rate gets more frequently or has other symptoms to call Dr Lonia Skinner office.  He asked if he could send a new ICM transmission tomorrow to recheck fluid levels and confirmed I would review tomorrow.

## 2015-09-17 NOTE — Assessment & Plan Note (Signed)
A; dermatitis with dry skin P: Kenalog cream OTC topical emollient

## 2015-09-17 NOTE — Progress Notes (Signed)
Depression screen [facility] 2/9 09/14/2015 05/14/2015 04/16/2015 04/16/2015 01/08/2015  Decreased Interest 0 0 2 0 0  Down, Depressed, Hopeless 0 0 1 0 0  PHQ - 2 Score 0 0 3 0 0  Altered sleeping 1 - 2 - -  Tired, decreased energy 1 - 3 - -  Change in appetite 1 - 1 - -  Feeling bad or failure about yourself  1 - 1 - -  Trouble concentrating 0 - 0 - -  Moving slowly or fidgety/restless 0 - 1 - -  Suicidal thoughts 0 - 0 - -  PHQ-9 Score 4 - 11 - -    GAD 7 : Generalized Anxiety Score 09/14/2015 04/16/2015  Nervous, Anxious, on Edge 1 1  Control/stop worrying 1 1  Worry too much - different things 1 1  Trouble relaxing 1 2  Restless 0 0  Easily annoyed or irritable 2 2  Afraid - awful might happen - 1  Total GAD 7 Score - 8

## 2015-09-17 NOTE — Assessment & Plan Note (Signed)
Controlled diabetes Continue current regimen

## 2015-09-18 ENCOUNTER — Ambulatory Visit (INDEPENDENT_AMBULATORY_CARE_PROVIDER_SITE_OTHER): Payer: Medicaid Other

## 2015-09-18 DIAGNOSIS — I5022 Chronic systolic (congestive) heart failure: Secondary | ICD-10-CM

## 2015-09-18 DIAGNOSIS — Z95810 Presence of automatic (implantable) cardiac defibrillator: Secondary | ICD-10-CM

## 2015-09-18 MED ORDER — DIGOXIN 125 MCG PO TABS: 0.1250 mg | ORAL_TABLET | Freq: Every day | ORAL | Status: DC

## 2015-09-18 NOTE — Telephone Encounter (Signed)
Rx(s) sent to pharmacy electronically.  

## 2015-09-18 NOTE — Progress Notes (Signed)
EPIC Encounter for ICM Monitoring  Patient Name: [name] is a 50 y.o. male Date: 09/18/2015 Primary Care Physican: Minerva Ends, MD Primary Cardiologist: Stanford Breed Electrophysiologist: Allred Dry Weight: Unknown    In the past month, have you:  1. Gained more than 2 pounds in a day or more than 5 pounds in a week? no  2. Had changes in your medications (with verification of current medications)? no  3. Had more shortness of breath than is usual for you? no  4. Limited your activity because of shortness of breath? no  5. Not been able to sleep because of shortness of breath? no  6. Had increased swelling in your feet or ankles? no  7. Had symptoms of dehydration (dizziness, dry mouth, increased thirst, decreased urine output) no  8. Had changes in sodium restriction? no  9. Been compliant with medication? Yes   ICM trend: 3 month view for 09/18/2015   ICM trend: 1 year view for 09/18/2015   Follow-up plan: ICM clinic phone appointment on 10/31/2015 which is day before office visit with Dr Stanford Breed on 5/25/207.   Since last ICM transmission on 09/13/2015, thoracic impedance below refererence line from 09/06/2015 to 09/13/2015 suggesting fluid accumulation and returned to reference line 09/14/2015.  He stated that he was traveling in [place] and could tell he had some fluid.  He stated he feels better now and no SOB at this time.  Reminded to limit sodium intake to < 2000 mg and fluid intake to 64 oz daily.  Encouraged to call for any fluid symptoms.  No changes today.     [name], RN, CCM 09/18/2015 2:56 PM

## 2015-09-24 ENCOUNTER — Encounter: Payer: Self-pay | Admitting: Family Medicine

## 2015-10-18 ENCOUNTER — Encounter: Payer: Self-pay | Admitting: Podiatry

## 2015-10-18 ENCOUNTER — Ambulatory Visit (INDEPENDENT_AMBULATORY_CARE_PROVIDER_SITE_OTHER): Payer: Medicaid Other | Admitting: Podiatry

## 2015-10-18 ENCOUNTER — Ambulatory Visit (INDEPENDENT_AMBULATORY_CARE_PROVIDER_SITE_OTHER): Payer: Medicaid Other

## 2015-10-18 VITALS — BP 110/67 | HR 73 | Resp 16

## 2015-10-18 DIAGNOSIS — M79671 Pain in right foot: Secondary | ICD-10-CM

## 2015-10-18 DIAGNOSIS — M722 Plantar fascial fibromatosis: Secondary | ICD-10-CM

## 2015-10-18 DIAGNOSIS — M79672 Pain in left foot: Principal | ICD-10-CM

## 2015-10-18 MED ORDER — TRIAMCINOLONE ACETONIDE 10 MG/ML IJ SUSP
10.0000 mg | Freq: Once | INTRAMUSCULAR | Status: AC
  Administered 2015-10-18: 10 mg

## 2015-10-19 NOTE — Progress Notes (Signed)
Subjective:     Patient ID: [name], male   DOB: 1965-08-05, 50 y.o.   MRN: [id number]  HPI patient presents with mid arch pain bilateral that makes it hard to walk comfortably   Review of Systems     Objective:   Physical Exam Neurovascular status intact muscle strength adequate with exquisite discomfort in the mid arch area bilateral    Assessment:     Mid arch plantar fasciitis bilateral    Plan:     H&P and x-rays reviewed and today careful mid arch injection was accomplished bilateral 3 mg Kenalog 5 mg Xylocaine and applied fascial taping bilateral to support the arch. Reappoint to recheck again in the next several weeks  X-ray report indicates moderate depression of the arch with no indications of stress fracture arthritis

## 2015-10-31 ENCOUNTER — Ambulatory Visit (INDEPENDENT_AMBULATORY_CARE_PROVIDER_SITE_OTHER): Payer: Medicaid Other

## 2015-10-31 ENCOUNTER — Telehealth: Payer: Self-pay | Admitting: Cardiology

## 2015-10-31 DIAGNOSIS — Z95810 Presence of automatic (implantable) cardiac defibrillator: Secondary | ICD-10-CM

## 2015-10-31 DIAGNOSIS — I5022 Chronic systolic (congestive) heart failure: Secondary | ICD-10-CM | POA: Diagnosis not present

## 2015-10-31 NOTE — Telephone Encounter (Signed)
LMOVM reminding pt to send remote transmission.   

## 2015-10-31 NOTE — Progress Notes (Signed)
HPI: FU CM/CHF. Echo 2/15 revealed EF of 20-25% with diffuse hypokinesis and biatrial enlargement, mild MR and a small pericardial effusion. TSH in February 2015 normal. Cardiac catheterization in March of 2015 showed normal coronary arteries. Echocardiogram repeated in June 2016 and showed ejection fraction 25-30%, moderate left ventricular hypertrophy, grade 1 diastolic dysfunction and moderate left atrial enlargement. Since last seen, He has some dyspnea on exertion. Mild orthopnea. No pedal edema. Occasional chest pain for seconds. No syncope.  Current Outpatient Prescriptions  Medication Sig Dispense Refill  . ACCU-CHEK SOFTCLIX LANCETS lancets 1 each by Other route 3 (three) times daily. (Patient taking differently: 1 each by Other route See admin instructions. Check blood sugar once weekly) 100 each 12  . amoxicillin (AMOXIL) 875 MG tablet Take 1 tablet (875 mg total) by mouth 2 (two) times daily. 20 tablet 0  . aspirin 81 MG tablet Take 81 mg by mouth daily.    . Blood Glucose Monitoring Suppl (ACCU-CHEK AVIVA PLUS) W/DEVICE KIT 1 Device by Does not apply route 3 (three) times daily after meals. (Patient taking differently: 1 Device by Does not apply route See admin instructions. Check blood sugar once weekly) 1 kit 0  . carvedilol (COREG) 25 MG tablet Take 1 tablet (25 mg total) by mouth 2 (two) times daily. 180 tablet 3  . digoxin (LANOXIN) 0.125 MG tablet Take 1 tablet (0.125 mg total) by mouth daily. 90 tablet 3  . furosemide (LASIX) 40 MG tablet Take 2 tablets (80 mg total) by mouth 2 (two) times daily. 360 tablet 3  . gabapentin (NEURONTIN) 300 MG capsule Take 1 capsule (300 mg total) by mouth at bedtime. 30 capsule 3  . glucose blood (ACCU-CHEK AVIVA PLUS) test strip 1 each by Other route 3 (three) times daily. (Patient taking differently: 1 each by Other route See admin instructions. Check blood sugar once weekly) 100 each 12  . guaiFENesin-codeine (ROBITUSSIN AC) 100-10 MG/5ML  syrup Take 5 mLs by mouth 3 (three) times daily as needed for cough. 120 mL 0  . Lancet Devices (ACCU-CHEK SOFTCLIX) lancets 1 each by Other route 3 (three) times daily. (Patient taking differently: 1 each by Other route See admin instructions. Check blood sugar once weekly) 1 each 0  . metFORMIN (GLUCOPHAGE) 1000 MG tablet 1000 mg in morning, 1500 mg in PM 225 tablet 3  . potassium chloride SA (K-DUR,KLOR-CON) 20 MEQ tablet Take 1 tablet (20 mEq total) by mouth daily. 90 tablet 3  . sacubitril-valsartan (ENTRESTO) 24-26 MG Take 1 tablet by mouth 2 (two) times daily.    . sitaGLIPtin (JANUVIA) 100 MG tablet Take 1 tablet (100 mg total) by mouth daily. 90 tablet 3  . spironolactone (ALDACTONE) 25 MG tablet TAKE ONE TABLET BY MOUTH DAILY (Patient taking differently: Take 25 mg by mouth once daily) 90 tablet 2  . triamcinolone cream (KENALOG) 0.1 % Apply 1 application topically 2 (two) times daily. 30 g 0   No current facility-administered medications for this visit.     Past Medical History  Diagnosis Date  . Obesity   . 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 History of renal calculi   . Cardiomyopathy ([place])     a. 07/2013 Echo: EF 20-25%, diff HK, Gr2 DD, mild MR, mod dil LA/RA.  Marland Kitchen Fracture of left humerus     a. 07/2013.  Marland Kitchen Chronic bronchitis ([place])     "get it most q yr" (  12/23/2013)  . Kidney stones   . GERD (gastroesophageal reflux disease)     not at present time  . Heart murmur     "born w/it"   . Obesity   . Chronic systolic dysfunction of left ventricle   . Syncope   . Hypertension   . Type II diabetes mellitus ([place]) 2006  . CHF (congestive heart failure) ([place])   . 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 Cholecystectomy  ~ 2010  . Cardiac catheterization  08/10/13  . Ureteroscopy      "laser for kidney stones"  . Orif humerus fracture Left  08/12/2013    Procedure: OPEN REDUCTION INTERNAL FIXATION (ORIF) HUMERAL SHAFT FRACTURE;  Surgeon: Newt Minion, MD;  Location: [place];  Service: Orthopedics;  Laterality: Left;  Open Reduction Internal Fixation Left Humerus  . Implantable cardioverter defibrillator implant  12/23/2013    STJ Fortify ICD implanted by [name] for cardiomyopathy and syncope  . Left heart catheterization with coronary angiogram N/A 08/10/2013    Procedure: LEFT HEART CATHETERIZATION WITH CORONARY ANGIOGRAM;  Surgeon: Jettie Booze, MD;  Location[facility] C[facility] LAB;  Service: Cardiovascular;  Laterality: N/A;  . Implantable cardioverter defibrillator implant N/A 12/23/2013    Procedure: IMPLANTABLE CARDIOVERTER DEFIBRILLATOR IMPLANT;  Surgeon: Coralyn Mark, MD;  Location: [facility] CATH LAB;  Service: Cardiovascular;  Laterality: N/A;    Social History   Social History  . Marital Status: Single    Spouse Name: N/A  . Number of Children: N/A  . Years of Education: N/A   Occupational History  . Not on file.   Social History Main Topics  . Smoking status: Former Smoker -- 1.00 packs/day for 28 years    Types: Cigarettes    Quit date: 06/03/2014  . Smokeless tobacco: Never Used  . Alcohol Use: 0.0 oz/week    0 Standard drinks or equivalent per week     Comment: 12/23/2013 "might drink a couple times/yr"  . Drug Use: Yes    Special: Marijuana  . Sexual Activity: Yes   Other Topics Concern  . Not on file   Social History Narrative    Family History  Problem Relation Age of Onset  . Diabetes Father   . Arthritis Father   . Diabetes Mother   . Arthritis Mother   . Hypertension Brother     ROS: no fevers or chills, productive cough, hemoptysis, dysphasia, odynophagia, melena, hematochezia, dysuria, hematuria, rash, seizure activity, orthopnea, PND, pedal edema, claudication. Remaining systems are negative.  Physical Exam: Well-developed well-nourished in no acute distress.  Skin is warm and dry.  HEENT  is normal.  Neck is supple.  Chest is clear to auscultation with normal expansion.  Cardiovascular exam is regular rate and rhythm.  Abdominal exam nontender or distended. No masses palpated. Extremities show no edema. neuro grossly intact

## 2015-10-31 NOTE — Progress Notes (Signed)
EPIC Encounter for ICM Monitoring  Patient Name: [name] is a 50 y.o. male Date: 10/31/2015 Primary Care Physican: Minerva Ends, MD Primary Cardiologist: Stanford Breed Electrophysiologist: Allred Dry Weight: 228 lbs      In the past month, have you:  1. Gained more than 2 pounds in a day or more than 5 pounds in a week? no  2. Had changes in your medications (with verification of current medications)? no  3. Had more shortness of breath than is usual for you? no  4. Limited your activity because of shortness of breath? no  5. Not been able to sleep because of shortness of breath? no  6. Had increased swelling in your feet, ankles, legs or stomach area? no  7. Had symptoms of dehydration (dizziness, dry mouth, increased thirst, decreased urine output) no  8. Had changes in sodium restriction? no  9. Been compliant with medication? Yes  ICM trend: 3 month view for 10/31/2015  ICM trend: 1 year view for 10/31/2015  Follow-up plan: ICM clinic phone appointment 12/05/2015.  He has office appointment with Dr Stanford Breed on 11/01/2015.    FLUID LEVELS:  Corvue thoracic impedance decreased 10/09/2015 to 10/16/2015 and 10/18/2015 to 10/23/2015 suggesting fluid accumulation and above reference line 10/27/2015 suggesting dryness.    SYMPTOMS:  None.  Denied any symptoms such as weight gain of 3 pounds overnight or 5 pounds within a week, SOB and/or lower extremity swelling. Encouraged to call for any fluid symptoms.   EDUCATION: Limit sodium intake to < 2000 mg and fluid intake to 64 oz daily.   Patient reported eating at [employer].   RECOMMENDATIONS: No changes today.    Advised will send to Dr Stanford Breed for review at office appointment if needed.      [name], RN, CCM 10/31/2015 3:37 PM

## 2015-11-01 ENCOUNTER — Encounter: Payer: Self-pay | Admitting: Cardiology

## 2015-11-01 ENCOUNTER — Ambulatory Visit (INDEPENDENT_AMBULATORY_CARE_PROVIDER_SITE_OTHER): Payer: Medicaid Other | Admitting: Cardiology

## 2015-11-01 VITALS — BP 110/73 | HR 77 | Ht 72.0 in | Wt 232.4 lb

## 2015-11-01 DIAGNOSIS — R079 Chest pain, unspecified: Secondary | ICD-10-CM

## 2015-11-01 DIAGNOSIS — Z95810 Presence of automatic (implantable) cardiac defibrillator: Secondary | ICD-10-CM | POA: Diagnosis not present

## 2015-11-01 DIAGNOSIS — I428 Other cardiomyopathies: Secondary | ICD-10-CM

## 2015-11-01 DIAGNOSIS — I429 Cardiomyopathy, unspecified: Secondary | ICD-10-CM

## 2015-11-01 DIAGNOSIS — I5022 Chronic systolic (congestive) heart failure: Secondary | ICD-10-CM

## 2015-11-01 DIAGNOSIS — G8929 Other chronic pain: Secondary | ICD-10-CM

## 2015-11-01 LAB — BASIC METABOLIC PANEL
BUN: 24 mg/dL (ref 7–25)
CALCIUM: 9.8 mg/dL (ref 8.6–10.3)
CHLORIDE: 98 mmol/L (ref 98–110)
CO2: 27 mmol/L (ref 20–31)
Creat: 1.83 mg/dL — ABNORMAL HIGH (ref 0.70–1.33)
Glucose, Bld: 75 mg/dL (ref 65–99)
Potassium: 4.1 mmol/L (ref 3.5–5.3)
Sodium: 140 mmol/L (ref 135–146)

## 2015-11-01 NOTE — Assessment & Plan Note (Addendum)
Followed by electrophysiology. 

## 2015-11-01 NOTE — Assessment & Plan Note (Signed)
No further evaluation.

## 2015-11-01 NOTE — Assessment & Plan Note (Addendum)
Euvolemic on examination. Continue present dose of Lasix. Continue low-sodium diet and daily weights. He will take an additional 40 mg of Lasix for weight gain of 2-3 pounds. Check potassium and renal function.

## 2015-11-01 NOTE — Patient Instructions (Signed)
Medication Instructions:   NO CHANGE  Labwork:  Your physician recommends that you HAVE LAB WORK TODAY  Follow-Up:  Your physician wants you to follow-up in: 6 MONTHS WITH DR CRENSHAW You will receive a reminder letter in the mail two months in advance. If you don't receive a letter, please call our office to schedule the follow-up appointment.   If you need a refill on your cardiac medications before your next appointment, please call your pharmacy.    

## 2015-11-01 NOTE — Assessment & Plan Note (Signed)
Continue entresto and beta blocker

## 2015-11-02 ENCOUNTER — Telehealth: Payer: Self-pay | Admitting: *Deleted

## 2015-11-02 DIAGNOSIS — I5022 Chronic systolic (congestive) heart failure: Secondary | ICD-10-CM

## 2015-11-02 NOTE — Telephone Encounter (Signed)
-----   Message from Lelon Perla, MD sent at 11/02/2015  5:38 AM EDT ----- Dc spironolactone, bmet 2 weeks [name]

## 2015-11-06 ENCOUNTER — Telehealth: Payer: Self-pay | Admitting: Cardiology

## 2015-11-06 ENCOUNTER — Ambulatory Visit (INDEPENDENT_AMBULATORY_CARE_PROVIDER_SITE_OTHER): Payer: Medicaid Other | Admitting: *Deleted

## 2015-11-06 ENCOUNTER — Other Ambulatory Visit: Payer: Self-pay | Admitting: Family Medicine

## 2015-11-06 DIAGNOSIS — I429 Cardiomyopathy, unspecified: Secondary | ICD-10-CM | POA: Diagnosis not present

## 2015-11-06 DIAGNOSIS — I428 Other cardiomyopathies: Secondary | ICD-10-CM

## 2015-11-06 NOTE — Telephone Encounter (Signed)
Spoke with pt and reminded pt of remote transmission that is due today. Pt verbalized understanding.   

## 2015-11-07 ENCOUNTER — Ambulatory Visit (INDEPENDENT_AMBULATORY_CARE_PROVIDER_SITE_OTHER): Payer: Medicaid Other

## 2015-11-07 DIAGNOSIS — I5022 Chronic systolic (congestive) heart failure: Secondary | ICD-10-CM

## 2015-11-07 DIAGNOSIS — Z95810 Presence of automatic (implantable) cardiac defibrillator: Secondary | ICD-10-CM

## 2015-11-07 NOTE — Progress Notes (Signed)
Remote ICD transmission.   

## 2015-11-07 NOTE — Progress Notes (Signed)
EPIC Encounter for ICM Monitoring  Patient Name: [name] is a 50 y.o. male Date: 11/07/2015 Primary Care Physican: Minerva Ends, MD Primary Cardiologist: Stanford Breed Electrophysiologist: Allred Dry Weight: 233 lbs       In the past month, have you:  1. Gained more than 2 pounds in a day or more than 5 pounds in a week? Yes, 4-5 lbs within the last 24 hours  2. Had changes in your medications (with verification of current medications)? no  3. Had more shortness of breath than is usual for you? no  4. Limited your activity because of shortness of breath? no  5. Not been able to sleep because of shortness of breath? no  6. Had increased swelling in your feet, ankles, legs or stomach area? no  7. Had symptoms of dehydration (dizziness, dry mouth, increased thirst, decreased urine output) no  8. Had changes in sodium restriction? Does not adhere to low sodium diet  9. Been compliant with medication? Yes  ICM trend: 3 month view for 11/07/2015   ICM trend: 1 year view for 11/07/2015   Follow-up plan: ICM clinic phone appointment 12/05/2015.   Received call from patient stating he gained weight overnight.  He sent a transmission 11/06/2015 askin for it to be reviewed for fluid accumulation.  FLUID LEVELS:  Since 10/31/2015 ICM transmission, Corvue thoracic impedance continued to be above reference line suggesting dryness and then returned to baseline 11/06/2015.  Explained no fluid accumulation is seen on his device but that does not always correlate with symptoms.   SYMPTOMS:  weight gain within last 24 hours.   EDUCATION:   Patient does not adhere to low salt diet.  He reported eating ribs with dry rub yesterday which was one of the things that were high in sodium.  He asked about having high creatinine lab results and explained how it relates to HF and diuretics.  He asked what he could do to help with lowering creatine.  Advised changing the diet and limiting salt intake will  decrease the need to taking extra Furosemide to eliminate extra fluid accumulation.  Dr Stanford Breed advised him to take extra fluid pill when needed and advised to follow his directions on taking extra medication.  He stated he will check weight again in the morning and if it has not decreased then will take extra fluid pill.       RECOMMENDATIONS: Advised to follow Dr Jacalyn Lefevre recommendations for when to take extra Furosemide for fluid symptoms and limit salt intake.   Encouraged him to call back if the extra fluid medication does help resolve weight gain.   Advised will send to PCP, [name] and Dr. Rayann Heman for review weight gain and decreased thoracic impedance.  If any further recommendations, will call back.    [name], RN, CCM 11/07/2015 1:11 PM

## 2015-11-08 ENCOUNTER — Encounter: Payer: Self-pay | Admitting: Podiatry

## 2015-11-08 ENCOUNTER — Ambulatory Visit (INDEPENDENT_AMBULATORY_CARE_PROVIDER_SITE_OTHER): Payer: Medicaid Other | Admitting: Podiatry

## 2015-11-08 DIAGNOSIS — G629 Polyneuropathy, unspecified: Secondary | ICD-10-CM

## 2015-11-08 DIAGNOSIS — B351 Tinea unguium: Secondary | ICD-10-CM | POA: Diagnosis not present

## 2015-11-08 DIAGNOSIS — M722 Plantar fascial fibromatosis: Secondary | ICD-10-CM

## 2015-11-08 MED ORDER — GABAPENTIN 300 MG PO CAPS: 300.0000 mg | ORAL_CAPSULE | Freq: Three times a day (TID) | ORAL | Status: DC

## 2015-11-09 NOTE — Progress Notes (Signed)
Subjective:     Patient ID: [name], male   DOB: 04/24/1966, 50 y.o.   MRN: [id number]  HPI patient presents stating that he is still having a lot of pain in his heels and the medication did not seem to help him and it's more burning and shooting at the current time   Review of Systems     Objective:   Physical Exam Neurovascular status unchanged with patient having pain in his feet in a generalized fashion at possibilities that systemic issues may be a part of the pathology he is experiencing    Assessment:     Reviewed condition and discussed different issues including back issues or other possible systemic issues associated with this    Plan:     We are going to try to increase his gabapentin to see if this will help to reduce the burning shooting pain he is experiencing and he does take one at night currently which she states he is able to tolerate well and we will go 23 a day. If it should cause him any problems he will back off and let us know but hopefully this will reduce symptoms

## 2015-11-18 ENCOUNTER — Other Ambulatory Visit: Payer: Self-pay | Admitting: Cardiology

## 2015-11-22 LAB — CUP PACEART REMOTE DEVICE CHECK
Battery Remaining Longevity: 89 mo
Battery Voltage: 3.01 V
Brady Statistic RV Percent Paced: 1 %
Date Time Interrogation Session: 20170530212947
HighPow Impedance: 70 Ohm
HighPow Impedance: 70 Ohm
Implantable Lead Implant Date: 20150717
Implantable Lead Location: 753860
Lead Channel Impedance Value: 390 Ohm
Lead Channel Sensing Intrinsic Amplitude: 11.8 mV
Lead Channel Setting Pacing Pulse Width: 0.5 ms
MDC IDC MSMT BATTERY REMAINING PERCENTAGE: 83 %
MDC IDC MSMT LEADCHNL RV PACING THRESHOLD AMPLITUDE: 0.75 V
MDC IDC MSMT LEADCHNL RV PACING THRESHOLD PULSEWIDTH: 0.5 ms
MDC IDC SET LEADCHNL RV PACING AMPLITUDE: 2.5 V
MDC IDC SET LEADCHNL RV SENSING SENSITIVITY: 0.5 mV
Pulse Gen Serial Number: 7196009

## 2015-11-27 ENCOUNTER — Encounter: Payer: Self-pay | Admitting: Cardiology

## 2015-12-05 ENCOUNTER — Ambulatory Visit (INDEPENDENT_AMBULATORY_CARE_PROVIDER_SITE_OTHER): Payer: Medicaid Other

## 2015-12-05 DIAGNOSIS — I5022 Chronic systolic (congestive) heart failure: Secondary | ICD-10-CM | POA: Diagnosis not present

## 2015-12-05 DIAGNOSIS — Z95810 Presence of automatic (implantable) cardiac defibrillator: Secondary | ICD-10-CM

## 2015-12-05 NOTE — Progress Notes (Signed)
EPIC Encounter for ICM Monitoring  Patient Name: [name] is a 50 y.o. male Date: 12/05/2015 Primary Care Physican: Minerva Ends, MD Primary Cardiologist: Stanford Breed Electrophysiologist: Allred Dry Weight: 232 lbs       In the past month, have you:  1. Gained more than 2 pounds in a day or more than 5 pounds in a week? Yes, weight gain up to 240 lbs in last week but had dropped back to baseline today.    2. Had changes in your medications (with verification of current medications)? No  3. Had more shortness of breath than is usual for you? No   4. Limited your activity because of shortness of breath? No   5. Not been able to sleep because of shortness of breath? No   6. Had increased swelling in your feet, ankles, legs or stomach area? No   7. Had symptoms of dehydration (dizziness, dry mouth, increased thirst, decreased urine output) No   8. Had changes in sodium restriction? No   9. Been compliant with medication? Yes   ICM trend: 3 month view for 12/05/2015   ICM trend: 1 year view for 12/05/2015   Follow-up plan: ICM clinic phone appointment 01/08/2016.    FLUID LEVELS: Corvue impedance decreased below baseline fluid accumulation for numerous days in June.  He returned to baseline 12/01/2015 suggesting stable fluid levels.    SYMPTOMS: Weight gain in past week but has returned to baseline.  He manages fluid symptoms by taking extra Lasix dosage as prescribed by Dr Stanford Breed.  Encouraged to call for any fluid symptoms.   RECOMMENDATIONS: No changes today.     [name], RN, CCM 12/05/2015 3:12 PM

## 2015-12-17 ENCOUNTER — Ambulatory Visit: Payer: Medicaid Other | Attending: Family Medicine | Admitting: Family Medicine

## 2015-12-17 ENCOUNTER — Encounter: Payer: Self-pay | Admitting: Family Medicine

## 2015-12-17 VITALS — BP 107/72 | HR 62 | Temp 98.3°F | Resp 18 | Ht 73.0 in | Wt 241.2 lb

## 2015-12-17 DIAGNOSIS — G5611 Other lesions of median nerve, right upper limb: Secondary | ICD-10-CM

## 2015-12-17 DIAGNOSIS — Z87891 Personal history of nicotine dependence: Secondary | ICD-10-CM | POA: Diagnosis not present

## 2015-12-17 DIAGNOSIS — M79672 Pain in left foot: Secondary | ICD-10-CM | POA: Diagnosis not present

## 2015-12-17 DIAGNOSIS — Z7982 Long term (current) use of aspirin: Secondary | ICD-10-CM | POA: Diagnosis not present

## 2015-12-17 DIAGNOSIS — Z7984 Long term (current) use of oral hypoglycemic drugs: Secondary | ICD-10-CM | POA: Diagnosis not present

## 2015-12-17 DIAGNOSIS — Z9889 Other specified postprocedural states: Secondary | ICD-10-CM | POA: Insufficient documentation

## 2015-12-17 DIAGNOSIS — Z794 Long term (current) use of insulin: Secondary | ICD-10-CM | POA: Diagnosis not present

## 2015-12-17 DIAGNOSIS — G5612 Other lesions of median nerve, left upper limb: Secondary | ICD-10-CM | POA: Diagnosis not present

## 2015-12-17 DIAGNOSIS — E1142 Type 2 diabetes mellitus with diabetic polyneuropathy: Secondary | ICD-10-CM | POA: Insufficient documentation

## 2015-12-17 DIAGNOSIS — Z79899 Other long term (current) drug therapy: Secondary | ICD-10-CM | POA: Diagnosis not present

## 2015-12-17 DIAGNOSIS — M545 Low back pain, unspecified: Secondary | ICD-10-CM

## 2015-12-17 DIAGNOSIS — M79602 Pain in left arm: Secondary | ICD-10-CM | POA: Diagnosis not present

## 2015-12-17 DIAGNOSIS — E118 Type 2 diabetes mellitus with unspecified complications: Secondary | ICD-10-CM | POA: Diagnosis not present

## 2015-12-17 DIAGNOSIS — G629 Polyneuropathy, unspecified: Secondary | ICD-10-CM | POA: Diagnosis not present

## 2015-12-17 DIAGNOSIS — Z9049 Acquired absence of other specified parts of digestive tract: Secondary | ICD-10-CM | POA: Insufficient documentation

## 2015-12-17 DIAGNOSIS — M79671 Pain in right foot: Secondary | ICD-10-CM | POA: Insufficient documentation

## 2015-12-17 LAB — GLUCOSE, POCT (MANUAL RESULT ENTRY): POC GLUCOSE: 133 mg/dL — AB (ref 70–99)

## 2015-12-17 LAB — POCT GLYCOSYLATED HEMOGLOBIN (HGB A1C): Hemoglobin A1C: 7.7

## 2015-12-17 MED ORDER — GABAPENTIN 300 MG PO CAPS: ORAL_CAPSULE | ORAL | Status: DC

## 2015-12-17 MED ORDER — TRAMADOL HCL 50 MG PO TABS: 50.0000 mg | ORAL_TABLET | Freq: Three times a day (TID) | ORAL | Status: DC | PRN

## 2015-12-17 NOTE — Progress Notes (Signed)
Subjective:  Patient ID: [name], male    DOB: 1966-03-09  Age: 50 y.o. MRN: [id number]  CC: Follow-up   HPI [name] presents for    1. 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 He has achy pain in L upper arm to L upper chest. Pain is constat. Worsened with lying on L side. He reports decreased ROM in L arm. He reports that eh did not have PT after his surgery. He is amenable to PT now.    2. Foot pain: b/l pain near toes. Achy and sharp pen and needle pain. No injury. No sores. No swelling. He takes gabapentin 300 mg three times daily for pain which helps.   3. Back pain: low back. L sided > R. Does seem to radiate at times. He had fecal or urinary urgency but no incontinence. He denies groin numbness. He denies trauma to his back   Past Surgical History  Procedure Laterality Date  . Cholecystectomy  ~ 2010  . Cardiac catheterization  08/10/13  . Ureteroscopy      "laser for kidney stones"  . Orif humerus fracture Left 08/12/2013    Procedure: OPEN REDUCTION INTERNAL FIXATION (ORIF) HUMERAL SHAFT FRACTURE;  Surgeon: Newt Minion, MD;  Location: [facility];  Service: Orthopedics;  Laterality: Left;  Open Reduction Internal Fixation Left Humerus  . Implantable cardioverter defibrillator implant  12/23/2013    STJ Fortify ICD implanted by [name] for cardiomyopathy and syncope  . Left heart catheterization with coronary angiogram N/A 08/10/2013    Procedure: LEFT HEART CATHETERIZATION WITH CORONARY ANGIOGRAM;  Surgeon: Jettie Booze, MD;  Location: [facility] CATH LAB;  Service: Cardiovascular;  Laterality: N/A;  . Implantable cardioverter defibrillator implant N/A 12/23/2013    Procedure: IMPLANTABLE CARDIOVERTER DEFIBRILLATOR IMPLANT;  Surgeon: Coralyn Mark, MD;  Location: [facility] CATH LAB;  Service: Cardiovascular;  Laterality: N/A;    Social History  Substance Use Topics  . Smoking status: Former Smoker -- 1.00  packs/day for 28 years    Types: Cigarettes    Quit date: 06/03/2014  . Smokeless tobacco: Never Used  . Alcohol Use: 0.0 oz/week    0 Standard drinks or equivalent per week     Comment: 12/23/2013 "might drink a couple times/yr"    Outpatient Prescriptions Prior to Visit  Medication Sig Dispense Refill  . ACCU-CHEK AVIVA PLUS test strip USE ONE STRIP THREE TIMES DAILY 100 each 12  . ACCU-CHEK SOFTCLIX LANCETS lancets USE ONE LANCETS  THREE TIMES DAILY 100 each 12  . amoxicillin (AMOXIL) 875 MG tablet Take 1 tablet (875 mg total) by mouth 2 (two) times daily. 20 tablet 0  . aspirin 81 MG tablet Take 81 mg by mouth daily.    . Blood Glucose Monitoring Suppl (ACCU-CHEK AVIVA PLUS) W/DEVICE KIT 1 Device by Does not apply route 3 (three) times daily after meals. (Patient taking differently: 1 Device by Does not apply route See admin instructions. Check blood sugar once weekly) 1 kit 0  . carvedilol (COREG) 25 MG tablet Take 1 tablet (25 mg total) by mouth 2 (two) times daily. 180 tablet 3  . digoxin (LANOXIN) 0.125 MG tablet Take 1 tablet (0.125 mg total) by mouth daily. 90 tablet 3  . furosemide (LASIX) 40 MG tablet Take 2 tablets (80 mg total) by mouth 2 (two) times daily. 360 tablet 3  . gabapentin (NEURONTIN) 300 MG capsule Take  1 capsule (300 mg total) by mouth at bedtime. 30 capsule 3  . gabapentin (NEURONTIN) 300 MG capsule Take 1 capsule (300 mg total) by mouth 3 (three) times daily. 90 capsule 3  . guaiFENesin-codeine (ROBITUSSIN AC) 100-10 MG/5ML syrup Take 5 mLs by mouth 3 (three) times daily as needed for cough. 120 mL 0  . KLOR-CON M20 20 MEQ tablet TAKE ONE TABLET BY MOUTH ONCE DAILY 90 tablet 0  . Lancet Devices (ACCU-CHEK SOFTCLIX) lancets 1 each by Other route 3 (three) times daily. (Patient taking differently: 1 each by Other route See admin instructions. Check blood sugar once weekly) 1 each 0  . metFORMIN (GLUCOPHAGE) 1000 MG tablet 1000 mg in morning, 1500 mg in PM 225 tablet  3  . sacubitril-valsartan (ENTRESTO) 24-26 MG Take 1 tablet by mouth 2 (two) times daily.    . sitaGLIPtin (JANUVIA) 100 MG tablet Take 1 tablet (100 mg total) by mouth daily. 90 tablet 3  . triamcinolone cream (KENALOG) 0.1 % Apply 1 application topically 2 (two) times daily. 30 g 0   No facility-administered medications prior to visit.    ROS Review of Systems  Constitutional: Positive for fatigue. Negative for fever, chills and unexpected weight change.  HENT: Negative for congestion and sore throat.   Eyes: Negative for visual disturbance.  Respiratory: Negative for cough and shortness of breath.   Cardiovascular: Negative for chest pain, palpitations and leg swelling.  Gastrointestinal: Negative for nausea, vomiting, abdominal pain, diarrhea, constipation and blood in stool.  Endocrine: Negative for polydipsia, polyphagia and polyuria.  Musculoskeletal: Positive for myalgias, back pain and arthralgias. Negative for gait problem and neck pain.  Skin: Negative for rash.  Allergic/Immunologic: Negative for immunocompromised state.  Hematological: Negative for adenopathy. Does not bruise/bleed easily.  Psychiatric/Behavioral: Negative for suicidal ideas, sleep disturbance and dysphoric mood. The patient is not nervous/anxious.     Objective:  BP 107/72 mmHg  Pulse 62  Temp(Src) 98.3 F (36.8 C) (Oral)  Resp 18  Ht _0  (1.854 m)  Wt 241 lb 3.2 oz (109.408 kg)  BMI 31.83 kg/m2  SpO2 97%  BP/Weight 12/17/2015 11/01/2015 1/61/0960  Systolic BP 454 098 119  Diastolic BP 72 73 67  Wt. (Lbs) 241.2 232.4 -  BMI 31.83 31.51 -    Physical Exam  Constitutional: He appears well-developed and well-nourished. No distress.  HENT:  Head: Normocephalic and atraumatic.  Neck: Normal range of motion. Neck supple.  Cardiovascular: Normal rate, regular rhythm, normal heart sounds and intact distal pulses.   Pulmonary/Chest: Effort normal and breath sounds normal.  Musculoskeletal: He  exhibits no edema.       Left shoulder: He exhibits decreased range of motion.       Left upper arm: He exhibits tenderness and deformity. He exhibits no bony tenderness, no swelling, no edema and no laceration.       Arms:      Right foot: Normal.       Left foot: Normal.  Back Exam: Back: Normal Curvature, no deformities or CVA tenderness  Paraspinal Tenderness: b/l L4   LE Strength 5/5  LE Sensation: in tact  LE Reflexes 2+ and symmetric  Straight leg raise: negative    Neurological: He is alert.  Skin: Skin is warm and dry. No rash noted. No erythema.  Psychiatric: He has a normal mood and affect.    Lab Results  Component Value Date   HGBA1C 7.8 09/14/2015   A1c 7.7  CBG 133 Assessment &  Plan:   [name] was seen today for follow-up.  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 -     Ambulatory referral to Physical Therapy -     gabapentin (NEURONTIN) 300 MG capsule; 300 mg in the morning, 300 mg in afternoon and 600 mg in evening  Diabetic polyneuropathy associated with type 2 diabetes mellitus (HCC) -     gabapentin (NEURONTIN) 300 MG capsule; 300 mg in the morning, 300 mg in afternoon and 600 mg in evening    No orders of the defined types were placed in this encounter.    Follow-up: No Follow-up on file.   Boykin Nearing MD

## 2015-12-17 NOTE — Patient Instructions (Addendum)
[name] was seen today for follow-up.  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 -     Ambulatory referral to Physical Therapy -     gabapentin (NEURONTIN) 300 MG capsule; 300 mg in the morning, 300 mg in afternoon and 600 mg in evening  Diabetic polyneuropathy associated with type 2 diabetes mellitus (HCC) -     gabapentin (NEURONTIN) 300 MG capsule; 300 mg in the morning, 300 mg in afternoon and 600 mg in evening    F/u in 6 weeks for back pain, arm pain and foot pain  Dr. Adrian Blackwater   Low Back Sprain With Rehab A sprain is an injury in which a ligament is torn. The ligaments of the lower back are vulnerable to sprains. However, they are strong and require great force to be injured. These ligaments are important for stabilizing the spinal column. Sprains are classified into three categories. Grade 1 sprains cause pain, but the tendon is not lengthened. Grade 2 sprains include a lengthened ligament, due to the ligament being stretched or partially ruptured. With grade 2 sprains there is still function, although the function may be decreased. Grade 3 sprains involve a complete tear of the tendon or muscle, and function is usually impaired. SYMPTOMS   Severe pain in the lower back.  Sometimes, a feeling of a "pop," "snap," or tear, at the time of injury.  Tenderness and sometimes swelling at the injury site.  Uncommonly, bruising (contusion) within 48 hours of injury.  Muscle spasms in the back. CAUSES  Low back sprains occur when a force is placed on the ligaments that is greater than they can handle. Common causes of injury include:  Performing a stressful act while off-balance.  Repetitive stressful  activities that involve movement of the lower back.  Direct hit (trauma) to the lower back. RISK INCREASES WITH:  Contact sports (football, wrestling).  Collisions (major skiing accidents).  Sports that require throwing or lifting (baseball, weightlifting).  Sports involving twisting of the spine (gymnastics, diving, tennis, golf).  Poor strength and flexibility.  Inadequate protection.  Previous back injury or surgery (especially fusion). PREVENTION  Wear properly fitted and padded protective equipment.  Warm up and stretch properly before activity.  Allow for adequate recovery between workouts.  Maintain physical fitness:  Strength, flexibility, and endurance.  Cardiovascular fitness.  Maintain a healthy body weight. PROGNOSIS  If treated properly, low back sprains usually heal with non-surgical treatment. The length of time for healing depends on the severity of the injury.  RELATED COMPLICATIONS   Recurring symptoms, resulting in a chronic problem.  Chronic inflammation and pain in the low back.  Delayed healing or resolution of symptoms, especially if activity is resumed too soon.  Prolonged impairment.  Unstable or arthritic joints of the low back. TREATMENT  Treatment first involves the use of ice and medicine, to reduce pain and inflammation. The use of strengthening and stretching exercises may help reduce pain with activity. These exercises may be performed at home or with a therapist. Severe injuries may require referral to a therapist for further evaluation and treatment, such as ultrasound. Your caregiver may advise that you  wear a back brace or corset, to help reduce pain and discomfort. Often, prolonged bed rest results in greater harm then benefit. Corticosteroid injections may be recommended. However, these should be reserved for the most serious cases. It is important to avoid using your back when lifting objects. At night, sleep on your back on a firm  mattress, with a pillow placed under your knees. If non-surgical treatment is unsuccessful, surgery may be needed.  MEDICATION   If pain medicine is needed, nonsteroidal anti-inflammatory medicines (aspirin and ibuprofen), or other minor pain relievers (acetaminophen), are often advised.  Do not take pain medicine for 7 days before surgery.  Prescription pain relievers may be given, if your caregiver thinks they are needed. Use only as directed and only as much as you need.  Ointments applied to the skin may be helpful.  Corticosteroid injections may be given by your caregiver. These injections should be reserved for the most serious cases, because they may only be given a certain number of times. HEAT AND COLD  Cold treatment (icing) should be applied for 10 to 15 minutes every 2 to 3 hours for inflammation and pain, and immediately after activity that aggravates your symptoms. Use ice packs or an ice massage.  Heat treatment may be used before performing stretching and strengthening activities prescribed by your caregiver, physical therapist, or athletic trainer. Use a heat pack or a warm water soak. SEEK MEDICAL CARE IF:   Symptoms get worse or do not improve in 2 to 4 weeks, despite treatment.  You develop numbness or weakness in either leg.  You lose bowel or bladder function.  Any of the following occur after surgery: fever, increased pain, swelling, redness, drainage of fluids, or bleeding in the affected area.  New, unexplained symptoms develop. (Drugs used in treatment may produce side effects.) EXERCISES  RANGE OF MOTION (ROM) AND STRETCHING EXERCISES - Low Back Sprain Most people with lower back pain will find that their symptoms get worse with excessive bending forward (flexion) or arching at the lower back (extension). The exercises that will help resolve your symptoms will focus on the opposite motion.  Your physician, physical therapist or athletic trainer will help you  determine which exercises will be most helpful to resolve your lower back pain. Do not complete any exercises without first consulting with your caregiver. Discontinue any exercises which make your symptoms worse, until you speak to your caregiver. If you have pain, numbness or tingling which travels down into your buttocks, leg or foot, the goal of the therapy is for these symptoms to move closer to your back and eventually resolve. Sometimes, these leg symptoms will get better, but your lower back pain may worsen. This is often an indication of progress in your rehabilitation. Be very alert to any changes in your symptoms and the activities in which you participated in the 24 hours prior to the change. Sharing this information with your caregiver will allow him or her to most efficiently treat your condition. These exercises may help you when beginning to rehabilitate your injury. Your symptoms may resolve with or without further involvement from your physician, physical therapist or athletic trainer. While completing these exercises, remember:   Restoring tissue flexibility helps normal motion to return to the joints. This allows healthier, less painful movement and activity.  An effective stretch should be held for at least 30 seconds.  A stretch should never be painful. You should only feel a gentle lengthening or release in the stretched  tissue. FLEXION RANGE OF MOTION AND STRETCHING EXERCISES: STRETCH - Flexion, Single Knee to Chest   Lie on a firm bed or floor with both legs extended in front of you.  Keeping one leg in contact with the floor, bring your opposite knee to your chest. Hold your leg in place by either grabbing behind your thigh or at your knee.  Pull until you feel a gentle stretch in your low back. Hold __________ seconds.  Slowly release your grasp and repeat the exercise with the opposite side. Repeat __________ times. Complete this exercise __________ times per day.   STRETCH - Flexion, Double Knee to Chest  Lie on a firm bed or floor with both legs extended in front of you.  Keeping one leg in contact with the floor, bring your opposite knee to your chest.  Tense your stomach muscles to support your back and then lift your other knee to your chest. Hold your legs in place by either grabbing behind your thighs or at your knees.  Pull both knees toward your chest until you feel a gentle stretch in your low back. Hold __________ seconds.  Tense your stomach muscles and slowly return one leg at a time to the floor. Repeat __________ times. Complete this exercise __________ times per day.  STRETCH - Low Trunk Rotation  Lie on a firm bed or floor. Keeping your legs in front of you, bend your knees so they are both pointed toward the ceiling and your feet are flat on the floor.  Extend your arms out to the side. This will stabilize your upper body by keeping your shoulders in contact with the floor.  Gently and slowly drop both knees together to one side until you feel a gentle stretch in your low back. Hold for __________ seconds.  Tense your stomach muscles to support your lower back as you bring your knees back to the starting position. Repeat the exercise to the other side. Repeat __________ times. Complete this exercise __________ times per day  EXTENSION RANGE OF MOTION AND FLEXIBILITY EXERCISES: STRETCH - Extension, Prone on Elbows   Lie on your stomach on the floor, a bed will be too soft. Place your palms about shoulder width apart and at the height of your head.  Place your elbows under your shoulders. If this is too painful, stack pillows under your chest.  Allow your body to relax so that your hips drop lower and make contact more completely with the floor.  Hold this position for __________ seconds.  Slowly return to lying flat on the floor. Repeat __________ times. Complete this exercise __________ times per day.  RANGE OF MOTION -  Extension, Prone Press Ups  Lie on your stomach on the floor, a bed will be too soft. Place your palms about shoulder width apart and at the height of your head.  Keeping your back as relaxed as possible, slowly straighten your elbows while keeping your hips on the floor. You may adjust the placement of your hands to maximize your comfort. As you gain motion, your hands will come more underneath your shoulders.  Hold this position __________ seconds.  Slowly return to lying flat on the floor. Repeat __________ times. Complete this exercise __________ times per day.  RANGE OF MOTION- Quadruped, Neutral Spine   Assume a hands and knees position on a firm surface. Keep your hands under your shoulders and your knees under your hips. You may place padding under your knees for comfort.  Drop  your head and point your tailbone toward the ground below you. This will round out your lower back like an angry cat. Hold this position for __________ seconds.  Slowly lift your head and release your tail bone so that your back sags into a large arch, like an old horse.  Hold this position for __________ seconds.  Repeat this until you feel limber in your low back.  Now, find your "sweet spot." This will be the most comfortable position somewhere between the two previous positions. This is your neutral spine. Once you have found this position, tense your stomach muscles to support your low back.  Hold this position for __________ seconds. Repeat __________ times. Complete this exercise __________ times per day.  STRENGTHENING EXERCISES - Low Back Sprain These exercises may help you when beginning to rehabilitate your injury. These exercises should be done near your "sweet spot." This is the neutral, low-back arch, somewhere between fully rounded and fully arched, that is your least painful position. When performed in this safe range of motion, these exercises can be used for people who have either a flexion  or extension based injury. These exercises may resolve your symptoms with or without further involvement from your physician, physical therapist or athletic trainer. While completing these exercises, remember:   Muscles can gain both the endurance and the strength needed for everyday activities through controlled exercises.  Complete these exercises as instructed by your physician, physical therapist or athletic trainer. Increase the resistance and repetitions only as guided.  You may experience muscle soreness or fatigue, but the pain or discomfort you are trying to eliminate should never worsen during these exercises. If this pain does worsen, stop and make certain you are following the directions exactly. If the pain is still present after adjustments, discontinue the exercise until you can discuss the trouble with your caregiver. STRENGTHENING - Deep Abdominals, Pelvic Tilt   Lie on a firm bed or floor. Keeping your legs in front of you, bend your knees so they are both pointed toward the ceiling and your feet are flat on the floor.  Tense your lower abdominal muscles to press your low back into the floor. This motion will rotate your pelvis so that your tail bone is scooping upwards rather than pointing at your feet or into the floor. With a gentle tension and even breathing, hold this position for __________ seconds. Repeat __________ times. Complete this exercise __________ times per day.  STRENGTHENING - Abdominals, Crunches   Lie on a firm bed or floor. Keeping your legs in front of you, bend your knees so they are both pointed toward the ceiling and your feet are flat on the floor. Cross your arms over your chest.  Slightly tip your chin down without bending your neck.  Tense your abdominals and slowly lift your trunk high enough to just clear your shoulder blades. Lifting higher can put excessive stress on the lower back and does not further strengthen your abdominal  muscles.  Control your return to the starting position. Repeat __________ times. Complete this exercise __________ times per day.  STRENGTHENING - Quadruped, Opposite UE/LE Lift   Assume a hands and knees position on a firm surface. Keep your hands under your shoulders and your knees under your hips. You may place padding under your knees for comfort.  Find your neutral spine and gently tense your abdominal muscles so that you can maintain this position. Your shoulders and hips should form a rectangle that is parallel  with the floor and is not twisted.  Keeping your trunk steady, lift your right hand no higher than your shoulder and then your left leg no higher than your hip. Make sure you are not holding your breath. Hold this position for __________ seconds.  Continuing to keep your abdominal muscles tense and your back steady, slowly return to your starting position. Repeat with the opposite arm and leg. Repeat __________ times. Complete this exercise __________ times per day.  STRENGTHENING - Abdominals and Quadriceps, Straight Leg Raise   Lie on a firm bed or floor with both legs extended in front of you.  Keeping one leg in contact with the floor, bend the other knee so that your foot can rest flat on the floor.  Find your neutral spine, and tense your abdominal muscles to maintain your spinal position throughout the exercise.  Slowly lift your straight leg off the floor about 6 inches for a count of 15, making sure to not hold your breath.  Still keeping your neutral spine, slowly lower your leg all the way to the floor. Repeat this exercise with each leg __________ times. Complete this exercise __________ times per day. POSTURE AND BODY MECHANICS CONSIDERATIONS - Low Back Sprain Keeping correct posture when sitting, standing or completing your activities will reduce the stress put on different body tissues, allowing injured tissues a chance to heal and limiting painful experiences.  The following are general guidelines for improved posture. Your physician or physical therapist will provide you with any instructions specific to your needs. While reading these guidelines, remember:  The exercises prescribed by your provider will help you have the flexibility and strength to maintain correct postures.  The correct posture provides the best environment for your joints to work. All of your joints have less wear and tear when properly supported by a spine with good posture. This means you will experience a healthier, less painful body.  Correct posture must be practiced with all of your activities, especially prolonged sitting and standing. Correct posture is as important when doing repetitive low-stress activities (typing) as it is when doing a single heavy-load activity (lifting). RESTING POSITIONS Consider which positions are most painful for you when choosing a resting position. If you have pain with flexion-based activities (sitting, bending, stooping, squatting), choose a position that allows you to rest in a less flexed posture. You would want to avoid curling into a fetal position on your side. If your pain worsens with extension-based activities (prolonged standing, working overhead), avoid resting in an extended position such as sleeping on your stomach. Most people will find more comfort when they rest with their spine in a more neutral position, neither too rounded nor too arched. Lying on a non-sagging bed on your side with a pillow between your knees, or on your back with a pillow under your knees will often provide some relief. Keep in mind, being in any one position for a prolonged period of time, no matter how correct your posture, can still lead to stiffness. PROPER SITTING POSTURE In order to minimize stress and discomfort on your spine, you must sit with correct posture. Sitting with good posture should be effortless for a healthy body. Returning to good posture is a  gradual process. Many people can work toward this most comfortably by using various supports until they have the flexibility and strength to maintain this posture on their own. When sitting with proper posture, your ears will fall over your shoulders and your shoulders will  fall over your hips. You should use the back of the chair to support your upper back. Your lower back will be in a neutral position, just slightly arched. You may place a small pillow or folded towel at the base of your lower back for  support.  When working at a desk, create an environment that supports good, upright posture. Without extra support, muscles tire, which leads to excessive strain on joints and other tissues. Keep these recommendations in mind: CHAIR:  A chair should be able to slide under your desk when your back makes contact with the back of the chair. This allows you to work closely.  The chair's height should allow your eyes to be level with the upper part of your monitor and your hands to be slightly lower than your elbows. BODY POSITION  Your feet should make contact with the floor. If this is not possible, use a foot rest.  Keep your ears over your shoulders. This will reduce stress on your neck and low back. INCORRECT SITTING POSTURES  If you are feeling tired and unable to assume a healthy sitting posture, do not slouch or slump. This puts excessive strain on your back tissues, causing more damage and pain. Healthier options include:  Using more support, like a lumbar pillow.  Switching tasks to something that requires you to be upright or walking.  Talking a brief walk.  Lying down to rest in a neutral-spine position. PROLONGED STANDING WHILE SLIGHTLY LEANING FORWARD  When completing a task that requires you to lean forward while standing in one place for a long time, place either foot up on a stationary 2-4 inch high object to help maintain the best posture. When both feet are on the ground, the  lower back tends to lose its slight inward curve. If this curve flattens (or becomes too large), then the back and your other joints will experience too much stress, tire more quickly, and can cause pain. CORRECT STANDING POSTURES Proper standing posture should be assumed with all daily activities, even if they only take a few moments, like when brushing your teeth. As in sitting, your ears should fall over your shoulders and your shoulders should fall over your hips. You should keep a slight tension in your abdominal muscles to brace your spine. Your tailbone should point down to the ground, not behind your body, resulting in an over-extended swayback posture.  INCORRECT STANDING POSTURES  Common incorrect standing postures include a forward head, locked knees and/or an excessive swayback. WALKING Walk with an upright posture. Your ears, shoulders and hips should all line-up. PROLONGED ACTIVITY IN A FLEXED POSITION When completing a task that requires you to bend forward at your waist or lean over a low surface, try to find a way to stabilize 3 out of 4 of your limbs. You can place a hand or elbow on your thigh or rest a knee on the surface you are reaching across. This will provide you more stability, so that your muscles do not tire as quickly. By keeping your knees relaxed, or slightly bent, you will also reduce stress across your lower back. CORRECT LIFTING TECHNIQUES DO :  Assume a wide stance. This will provide you more stability and the opportunity to get as close as possible to the object which you are lifting.  Tense your abdominals to brace your spine. Bend at the knees and hips. Keeping your back locked in a neutral-spine position, lift using your leg muscles. Lift with your  legs, keeping your back straight.  Test the weight of unknown objects before attempting to lift them.  Try to keep your elbows locked down at your sides in order get the best strength from your shoulders when  carrying an object.  Always ask for help when lifting heavy or awkward objects. INCORRECT LIFTING TECHNIQUES DO NOT:   Lock your knees when lifting, even if it is a small object.  Bend and twist. Pivot at your feet or move your feet when needing to change directions.  Assume that you can safely pick up even a paperclip without proper posture.   This information is not intended to replace advice given to you by your health care provider. Make sure you discuss any questions you have with your health care provider.   Document Released: 05/26/2005 Document Revised: 06/16/2014 Document Reviewed: 09/07/2008 Elsevier Interactive Patient Education [employer].

## 2015-12-17 NOTE — Progress Notes (Signed)
Patient is here for FU pain  Patient complains of left shoulder pain being present scaled at an 8. Patient complains of feet pain being scaled at a 6.  Patient has taken medication today and patient has eaten.

## 2015-12-18 DIAGNOSIS — M545 Low back pain, unspecified: Secondary | ICD-10-CM | POA: Insufficient documentation

## 2015-12-18 DIAGNOSIS — E1142 Type 2 diabetes mellitus with diabetic polyneuropathy: Secondary | ICD-10-CM | POA: Insufficient documentation

## 2015-12-18 NOTE — Assessment & Plan Note (Signed)
Pain consistent with neuropathy Continue gabapentin Add tramadol

## 2015-12-18 NOTE — Assessment & Plan Note (Signed)
No sciatica No trauma MSK pain  Plan: Pain control with tramadol Home PT

## 2015-12-18 NOTE — Assessment & Plan Note (Signed)
Chronic pain Tramadol PT

## 2015-12-27 ENCOUNTER — Ambulatory Visit: Payer: Medicaid Other | Admitting: Physical Therapy

## 2016-01-08 ENCOUNTER — Telehealth: Payer: Self-pay | Admitting: *Deleted

## 2016-01-08 ENCOUNTER — Ambulatory Visit (INDEPENDENT_AMBULATORY_CARE_PROVIDER_SITE_OTHER): Payer: Medicaid Other

## 2016-01-08 ENCOUNTER — Telehealth: Payer: Self-pay | Admitting: Cardiology

## 2016-01-08 DIAGNOSIS — Z95810 Presence of automatic (implantable) cardiac defibrillator: Secondary | ICD-10-CM | POA: Diagnosis not present

## 2016-01-08 DIAGNOSIS — I5022 Chronic systolic (congestive) heart failure: Secondary | ICD-10-CM

## 2016-01-08 NOTE — Telephone Encounter (Signed)
Spoke with pt and reminded pt of remote transmission that is due today. Pt verbalized understanding.   

## 2016-01-08 NOTE — Telephone Encounter (Signed)
Patient called to obtain [employer] number.  Gave patient phone number.  He is appreciative of call and denies additional questions or concerns at this time.

## 2016-01-09 ENCOUNTER — Telehealth: Payer: Self-pay | Admitting: Cardiology

## 2016-01-09 NOTE — Progress Notes (Signed)
EPIC Encounter for ICM Monitoring  Patient Name: [name] is a 50 y.o. male Date: 01/09/2016 Primary Care Physican: Minerva Ends, MD Primary Cardiologist: Stanford Breed Electrophysiologist: Allred Dry Weight: 239 lb        Heart Failure questions reviewed, pt reported he has had chest pain off and on in the last month.  Advised he should be seen at ER if needed to be evaluated. He stated it seems to be chronic.  Advised to call office for appointment with Dr Stanford Breed or PA/NP to be evaluated.  He stated he would call today.  He stated he had some fluid symptoms of weight gain last week but is better after taking extra fluid pill.    Thoracic impedance normal at this time.  Impedance was below baseline a few days at end of July.    Recommendations:  Call office for appointment.     ICM trend: 01/08/2016     Follow-up plan: ICM clinic phone appointment on 02/13/2016.  Copy of ICM check sent to primary cardiologist and device physician.   [name], RN 01/09/2016 3:36 PM

## 2016-01-09 NOTE — Telephone Encounter (Signed)
Pt c/o of Chest Pain: 1. Are you having CP right now? No  2. Are you experiencing any other symptoms (ex. SOB, nausea, vomiting, sweating)? No 3. How long have you been experiencing CP? About 2 weeks  4. Is your CP continuous or coming and going?coming and going  5. Have you taken Nitroglycerin? no

## 2016-01-09 NOTE — Telephone Encounter (Signed)
Spoke with pt, he was called by laurie short today because his optival readings have been elevated. He also reported to her chest pain off and on for about 2 weeks now. He feels it is probably related to fluid. He does reports trouble breathing at times in the morning. Follow up scheduled with pa tomorrow. Patient voiced understanding of appt time and location.

## 2016-01-10 ENCOUNTER — Ambulatory Visit (INDEPENDENT_AMBULATORY_CARE_PROVIDER_SITE_OTHER): Payer: Medicaid Other | Admitting: Physician Assistant

## 2016-01-10 ENCOUNTER — Encounter: Payer: Self-pay | Admitting: Physician Assistant

## 2016-01-10 VITALS — BP 124/72 | HR 69 | Ht 73.0 in | Wt 242.0 lb

## 2016-01-10 DIAGNOSIS — I429 Cardiomyopathy, unspecified: Secondary | ICD-10-CM | POA: Diagnosis not present

## 2016-01-10 DIAGNOSIS — R079 Chest pain, unspecified: Secondary | ICD-10-CM

## 2016-01-10 DIAGNOSIS — I5022 Chronic systolic (congestive) heart failure: Secondary | ICD-10-CM | POA: Diagnosis not present

## 2016-01-10 DIAGNOSIS — R5383 Other fatigue: Secondary | ICD-10-CM

## 2016-01-10 DIAGNOSIS — I10 Essential (primary) hypertension: Secondary | ICD-10-CM

## 2016-01-10 DIAGNOSIS — Z95810 Presence of automatic (implantable) cardiac defibrillator: Secondary | ICD-10-CM

## 2016-01-10 DIAGNOSIS — R0602 Shortness of breath: Secondary | ICD-10-CM

## 2016-01-10 DIAGNOSIS — I428 Other cardiomyopathies: Secondary | ICD-10-CM

## 2016-01-10 DIAGNOSIS — N189 Chronic kidney disease, unspecified: Secondary | ICD-10-CM

## 2016-01-10 LAB — CBC
HCT: 34.9 % — ABNORMAL LOW (ref 38.5–50.0)
Hemoglobin: 12 g/dL — ABNORMAL LOW (ref 13.2–17.1)
MCH: 30.3 pg (ref 27.0–33.0)
MCHC: 34.4 g/dL (ref 32.0–36.0)
MCV: 88.1 fL (ref 80.0–100.0)
MPV: 8.8 fL (ref 7.5–12.5)
PLATELETS: 297 10*3/uL (ref 140–400)
RBC: 3.96 MIL/uL — AB (ref 4.20–5.80)
RDW: 14.3 % (ref 11.0–15.0)
WBC: 7.3 10*3/uL (ref 3.8–10.8)

## 2016-01-10 LAB — BASIC METABOLIC PANEL
BUN: 27 mg/dL — ABNORMAL HIGH (ref 7–25)
CALCIUM: 10.2 mg/dL (ref 8.6–10.3)
CO2: 30 mmol/L (ref 20–31)
CREATININE: 2.08 mg/dL — AB (ref 0.70–1.33)
Chloride: 95 mmol/L — ABNORMAL LOW (ref 98–110)
Glucose, Bld: 154 mg/dL — ABNORMAL HIGH (ref 65–99)
Potassium: 4.4 mmol/L (ref 3.5–5.3)
SODIUM: 138 mmol/L (ref 135–146)

## 2016-01-10 LAB — TSH: TSH: 1.12 m[IU]/L (ref 0.40–4.50)

## 2016-01-10 NOTE — Patient Instructions (Addendum)
Medication Instructions:  Your physician recommends that you continue on your current medications as directed. Please refer to the Current Medication list given to you today.   Labwork: TODAY:  BMP, BNP, TSH, & CBC  Testing/Procedures: Your physician has requested that you have an echocardiogram. Echocardiography is a painless test that uses sound waves to create images of your heart. It provides your doctor with information about the size and shape of your heart and how well your heart's chambers and valves are working. This procedure takes approximately one hour. There are no restrictions for this procedure.  Follow-Up: Your physician recommends that you schedule a follow-up appointment in: 3 MONTHS WITH DR. CRENSHAW   Any Other Special Instructions Will Be Listed Below (If Applicable).  Echocardiogram An echocardiogram, or echocardiography, uses sound waves (ultrasound) to produce an image of your heart. The echocardiogram is simple, painless, obtained within a short period of time, and offers valuable information to your health care provider. The images from an echocardiogram can provide information such as:  Evidence of coronary artery disease (CAD).  Heart size.  Heart muscle function.  Heart valve function.  Aneurysm detection.  Evidence of a past heart attack.  Fluid buildup around the heart.  Heart muscle thickening.  Assess heart valve function. LET [facility] CARE PROVIDER KNOW ABOUT:  Any allergies you have.  All medicines you are taking, including vitamins, herbs, eye drops, creams, and over-the-counter medicines.  Previous problems you or members of your family have had with the use of anesthetics.  Any blood disorders you have.  Previous surgeries you have had.  Medical conditions you have.  Possibility of pregnancy, if this applies. BEFORE THE PROCEDURE  No special preparation is needed. Eat and drink normally.  PROCEDURE   In order to produce  an image of your heart, gel will be applied to your chest and a wand-like tool (transducer) will be moved over your chest. The gel will help transmit the sound waves from the transducer. The sound waves will harmlessly bounce off your heart to allow the heart images to be captured in real-time motion. These images will then be recorded.  You may need an IV to receive a medicine that improves the quality of the pictures. AFTER THE PROCEDURE You may return to your normal schedule including diet, activities, and medicines, unless your health care provider tells you otherwise.   This information is not intended to replace advice given to you by your health care provider. Make sure you discuss any questions you have with your health care provider.   Document Released: 05/23/2000 Document Revised: 06/16/2014 Document Reviewed: 01/31/2013 Elsevier Interactive Patient Education [employer].    If you need a refill on your cardiac medications before your next appointment, please call your pharmacy.

## 2016-01-10 NOTE — Progress Notes (Signed)
Cardiology Office Note    Date:  01/10/2016   ID:  [name], DOB [birth year], MRN [id number]  PCP:  Minerva Ends, MD  Cardiologist: [name] EP: Dr. Rayann Heman  CC: SOB, CP, decreased impedance on device  History of Present Illness:  [name] is a 50 y.o. male with a history of NICM/chronic systolic CHF (EF 31-49%) s/p StJ ICD, obesity, HTN, DMT2 and obesity who presents to clinic for evaluation of worsening SOB and chest pain.  Echo 2/15 revealed EF of 20-25% with diffuse hypokinesis and biatrial enlargement, mild MR and a small pericardial effusion. Cardiac catheterization in 08/2013 showed normal coronary arteries. STJ Fortify ICD implanted by [name] for cardiomyopathy and syncope in 12/2013. Repeat 2D ECHO in 11/2014 and showed EF 25-30%, moderate LVH, G1DD, and mod LAE. He is known to have chronic atypical chest pain.   He saw [name] in 10/2015 and BMET showed creat had increased to 1.83 and spiro was discontinued.   He called the office to report ongoing chest pain x1 month and worsening SOB. He did take an extra lasix which did help. Device interrogated yesterday 01/09/16 which showed normal thoracic impedance. Impedance was below baseline a few days at end of July.   Today he presents to clinic for follow up. He has chronic chest pain that is sometimes sharp and sometimes dull and aching. Not worse with exertion. He does have dyspnea on exertion. Breathing is always difficult but it seems worse over the past month or so. He also says his weight is about 10 lbs over his baseline ~230 lbs with chronic orthopnea and PND. He feels low energy and just doesn't feel right. "just doesn't feel himself"   He did smoke for 30 years for 1-2 PPD but quit 2 years ago. Used to follow with Dr. Lenna Gilford with PCCM but he is now retired. Says he has chronic bronchitis. I do not see a diagnosis of COPD.  His maternal uncle died of a MI at age of 63. Both parents had MIs in 59s and 31s.    Past Medical History:  Diagnosis Date  . Cardiomyopathy ([place])    a. 07/2013 Echo: EF 20-25%, diff HK, Gr2 DD, mild MR, mod dil LA/RA.  Marland Kitchen CHF (congestive heart failure) ([place])   . Chronic bronchitis ([place])    "get it most q yr" (12/23/2013)  . Chronic systolic dysfunction of left ventricle   . Fracture of left humerus    a. 07/2013.  Marland Kitchen GERD (gastroesophageal reflux disease)    not at present time  . Heart murmur    "born w/it"   . History of renal calculi   . Hypertension   . Kidney stones   . Obesity   . Obesity   . 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 Syncope   . Type II diabetes mellitus ([place]) 2006    Past Surgical History:  Procedure Laterality Date  . CARDIAC CATHETERIZATION  08/10/13  . CHOLECYSTECTOMY  ~ 2010  . IMPLANTABLE CARDIOVERTER DEFIBRILLATOR IMPLANT  12/23/2013   STJ Fortify ICD implanted by [name] for cardiomyopathy and syncope  . IMPLANTABLE CARDIOVERTER DEFIBRILLATOR IMPLANT N/A 12/23/2013   Procedure: IMPLANTABLE CARDIOVERTER DEFIBRILLATOR IMPLANT;  Surgeon: Coralyn Mark, MD;  Location: [facility] CATH LAB;  Service: Cardiovascular;  Laterality: N/A;  . LEFT HEART CATHETERIZATION WITH CORONARY ANGIOGRAM N/A 08/10/2013  Procedure: LEFT HEART CATHETERIZATION WITH CORONARY ANGIOGRAM;  Surgeon: Jettie Booze, MD;  Location: [facility] CATH LAB;  Service: Cardiovascular;  Laterality: N/A;  . ORIF HUMERUS FRACTURE Left 08/12/2013   Procedure: OPEN REDUCTION INTERNAL FIXATION (ORIF) HUMERAL SHAFT FRACTURE;  Surgeon: Newt Minion, MD;  Location: [place] [facility];  Service: Orthopedics;  Laterality: Left;  Open Reduction Internal Fixation Left Humerus  . URETEROSCOPY     "laser for kidney stones"    Current Medications: Outpatient Medications Prior to Visit  Medication Sig Dispense Refill  . ACCU-CHEK AVIVA PLUS test strip USE ONE STRIP THREE TIMES DAILY 100 each  12  . aspirin 81 MG tablet Take 81 mg by mouth daily.    . Blood Glucose Monitoring Suppl (ACCU-CHEK AVIVA PLUS) W/DEVICE KIT 1 Device by Does not apply route 3 (three) times daily after meals. 1 kit 0  . carvedilol (COREG) 25 MG tablet Take 1 tablet (25 mg total) by mouth 2 (two) times daily. 180 tablet 3  . digoxin (LANOXIN) 0.125 MG tablet Take 1 tablet (0.125 mg total) by mouth daily. 90 tablet 3  . furosemide (LASIX) 40 MG tablet Take 2 tablets (80 mg total) by mouth 2 (two) times daily. 360 tablet 3  . gabapentin (NEURONTIN) 300 MG capsule 300 mg in the morning, 300 mg in afternoon and 600 mg in evening 120 capsule 3  . KLOR-CON M20 20 MEQ tablet TAKE ONE TABLET BY MOUTH ONCE DAILY 90 tablet 0  . Lancet Devices (ACCU-CHEK SOFTCLIX) lancets 1 each by Other route 3 (three) times daily. 1 each 0  . metFORMIN (GLUCOPHAGE) 1000 MG tablet 1000 mg in morning, 1500 mg in PM 225 tablet 3  . sacubitril-valsartan (ENTRESTO) 24-26 MG Take 1 tablet by mouth 2 (two) times daily.    . sitaGLIPtin (JANUVIA) 100 MG tablet Take 1 tablet (100 mg total) by mouth daily. 90 tablet 3  . triamcinolone cream (KENALOG) 0.1 % Apply 1 application topically 2 (two) times daily. 30 g 0  . ACCU-CHEK SOFTCLIX LANCETS lancets USE ONE LANCETS  THREE TIMES DAILY (Patient not taking: Reported on 01/10/2016) 100 each 12  . traMADol (ULTRAM) 50 MG tablet Take 1 tablet (50 mg total) by mouth every 8 (eight) hours as needed. (Patient not taking: Reported on 01/10/2016) 30 tablet 0   No facility-administered medications prior to visit.      Allergies:   Bee venom   Social History   Social History  . Marital status: Single    Spouse name: N/A  . Number of children: N/A  . Years of education: N/A   Social History Main Topics  . Smoking status: Former Smoker    Packs/day: 1.00    Years: 28.00    Types: Cigarettes    Quit date: 06/03/2014  . Smokeless tobacco: Never Used  . Alcohol use 0.0 oz/week     Comment: 12/23/2013  "might drink a couple times/yr"  . Drug use:     Types: Marijuana  . Sexual activity: Yes   Other Topics Concern  . None   Social History Narrative  . None     Family History:  The patient's family history includes Arthritis in his father and mother; Diabetes in his father and mother; Hypertension in his brother.     ROS:   Please see the history of present illness.    ROS All other systems reviewed and are negative.   PHYSICAL EXAM:   VS:  BP 124/72   Pulse 69  Ht 6' 1"  (1.854 m)   Wt 242 lb (109.8 kg)   BMI 31.93 kg/m    GEN: Well nourished, well developed, in no acute distress  HEENT: normal  Neck: no JVD, carotid bruits, or masses Cardiac: RRR; no murmurs, rubs, or gallops,no edema  Respiratory:  clear to auscultation bilaterally, normal work of breathing GI: soft, nontender, nondistended, + BS MS: no deformity or atrophy  Skin: warm and dry, no rash Neuro:  Alert and Oriented x 3, Strength and sensation are intact Psych: euthymic mood, full affect  Wt Readings from Last 3 Encounters:  01/10/16 242 lb (109.8 kg)  12/17/15 241 lb 3.2 oz (109.4 kg)  11/01/15 232 lb 6.4 oz (105.4 kg)      Studies/Labs Reviewed:   EKG:  EKG is ordered today.  The ekg ordered today demonstrates NSR HR 69, LAD , non specific ST/TW abnormality.  Recent Labs: 04/01/2015: Hemoglobin 12.2; Magnesium 1.5; Platelets 303 05/08/2015: Brain Natriuretic Peptide 11.5 11/01/2015: BUN 24; Creat 1.83; Potassium 4.1; Sodium 140   Lipid Panel    Component Value Date/Time   CHOL 147 05/08/2009 1040   TRIG 135.0 05/08/2009 1040   HDL 35.50 (L) 05/08/2009 1040   CHOLHDL 4 05/08/2009 1040   VLDL 27.0 05/08/2009 1040   LDLCALC 85 05/08/2009 1040    Additional studies/ records that were reviewed today include:  2D ECHO: 11/16/2014 LV EF: 25- 30% Study Conclusions - Left ventricle: The cavity size was normal. Wall thickness was   increased in a pattern of moderate LVH. Systolic function  was   severely reduced. The estimated ejection fraction was in the   range of 25% to 30%. Doppler parameters are consistent with   abnormal left ventricular relaxation (grade 1 diastolic   dysfunction). - Left atrium: The atrium was moderately dilated.   ASSESSMENT & PLAN:   UDAY JANTZ is a 50 y.o. male with a history of NICM/chronic systolic CHF (EF 41-32%) s/p StJ ICD, obesity, HTN, DMT2 and obesity who presents to clinic for evaluation of worsening SOB and chest pain.  NICM/chronic systolic CHF s/p STJ ICD: appears euvolemic, but still feels more SOB than usual with orthopnea and PND. He says he is 10 lbs over his dry weight of ~230lbs. Impedance interrogation shows that he is at baseline as of yesterday. Will check BMET and BNP and diurese accordingly. Will update 2D ECHO -- Continue Entresto 24-42m, Coreg 255mBID, digoxin 0.1259maily and lasix 87m43mD. -- ICD followed remotely. Continue f/u with Dr. AllrRayann Heman- If SOB continues and no explanation from cardiac perspective, will have him referred back to pulmonology (used to bed followed by Dr. NadeLenna Gilford Chronic chest pain: cath with no CAD in 2015. No further work up   HTN: BP well controlled on current regimen  Obesity: Body mass index is 31.93 kg/m.  CKD: creat 1.83 in 10/2015. BMET today.   Fatigue: will check a TSH and CBC.  Medication Adjustments/Labs and Tests Ordered: Current medicines are reviewed at length with the patient today.  Concerns regarding medicines are outlined above.  Medication changes, Labs and Tests ordered today are listed in the Patient Instructions below. Patient Instructions  Medication Instructions:  Your physician recommends that you continue on your current medications as directed. Please refer to the Current Medication list given to you today.   Labwork: TODAY:  BMP, BNP, TSH, & CBC  Testing/Procedures: Your physician has requested that you have an echocardiogram. Echocardiography is  a  painless test that uses sound waves to create images of your heart. It provides your doctor with information about the size and shape of your heart and how well your heart's chambers and valves are working. This procedure takes approximately one hour. There are no restrictions for this procedure.  Follow-Up: Your physician recommends that you schedule a follow-up appointment in: 3 MONTHS WITH DR. CRENSHAW   Any Other Special Instructions Will Be Listed Below (If Applicable).  Echocardiogram An echocardiogram, or echocardiography, uses sound waves (ultrasound) to produce an image of your heart. The echocardiogram is simple, painless, obtained within a short period of time, and offers valuable information to your health care provider. The images from an echocardiogram can provide information such as:  Evidence of coronary artery disease (CAD).  Heart size.  Heart muscle function.  Heart valve function.  Aneurysm detection.  Evidence of a past heart attack.  Fluid buildup around the heart.  Heart muscle thickening.  Assess heart valve function. LET [facility] CARE PROVIDER KNOW ABOUT:  Any allergies you have.  All medicines you are taking, including vitamins, herbs, eye drops, creams, and over-the-counter medicines.  Previous problems you or members of your family have had with the use of anesthetics.  Any blood disorders you have.  Previous surgeries you have had.  Medical conditions you have.  Possibility of pregnancy, if this applies. BEFORE THE PROCEDURE  No special preparation is needed. Eat and drink normally.  PROCEDURE   In order to produce an image of your heart, gel will be applied to your chest and a wand-like tool (transducer) will be moved over your chest. The gel will help transmit the sound waves from the transducer. The sound waves will harmlessly bounce off your heart to allow the heart images to be captured in real-time motion. These images will then  be recorded.  You may need an IV to receive a medicine that improves the quality of the pictures. AFTER THE PROCEDURE You may return to your normal schedule including diet, activities, and medicines, unless your health care provider tells you otherwise.   This information is not intended to replace advice given to you by your health care provider. Make sure you discuss any questions you have with your health care provider.   Document Released: 05/23/2000 Document Revised: 06/16/2014 Document Reviewed: 01/31/2013 Elsevier Interactive Patient Education [employer].    If you need a refill on your cardiac medications before your next appointment, please call your pharmacy.      Mable Fill, PA-C  01/10/2016 3:00 PM    [address], [place], [place]  [address] Phone: [phone]; Fax: [phone]

## 2016-01-11 LAB — BRAIN NATRIURETIC PEPTIDE: BRAIN NATRIURETIC PEPTIDE: 4.4 pg/mL (ref <100)

## 2016-01-13 ENCOUNTER — Other Ambulatory Visit: Payer: Self-pay | Admitting: Family Medicine

## 2016-01-13 DIAGNOSIS — K219 Gastro-esophageal reflux disease without esophagitis: Secondary | ICD-10-CM

## 2016-01-14 ENCOUNTER — Telehealth: Payer: Self-pay | Admitting: Cardiology

## 2016-01-14 DIAGNOSIS — R079 Chest pain, unspecified: Secondary | ICD-10-CM

## 2016-01-14 NOTE — Telephone Encounter (Signed)
Returning A Call . Please call .Marland Kitchen Thanks

## 2016-01-14 NOTE — Telephone Encounter (Signed)
Returned call to patient. Gave him the lab results.  Advised patient to discontinue his digoxin. Order entered for CPX. Message sent to scheduling for CPX and for appt with CHF clinic.   Notes Recorded by Eileen Stanford, PA-C on 01/11/2016 at 10:10 AM EDT BNP is completely normal suggesting he is not volume overloaded. No change in diuretics ------  Notes Recorded by Eileen Stanford, PA-C on 01/11/2016 at 10:08 AM EDT Anderson Malta, I discussed results with [name]. His kidney function is getting worse. Plan is to discontinue digoxin. Continue other meds. BNP still pending, but his shortness of breath is out of proportion to what I saw on physical exam, so we think that he may need to be seen by the advanced CHF clinic and have a CPX. Can you call the patient to let him know and get this arranged? Thanks  Patient verbalized understanding.

## 2016-01-24 ENCOUNTER — Ambulatory Visit (HOSPITAL_COMMUNITY): Payer: Medicaid Other | Attending: Cardiovascular Disease

## 2016-01-24 ENCOUNTER — Other Ambulatory Visit: Payer: Self-pay

## 2016-01-24 DIAGNOSIS — I509 Heart failure, unspecified: Secondary | ICD-10-CM | POA: Insufficient documentation

## 2016-01-24 DIAGNOSIS — I517 Cardiomegaly: Secondary | ICD-10-CM | POA: Insufficient documentation

## 2016-01-24 DIAGNOSIS — I429 Cardiomyopathy, unspecified: Secondary | ICD-10-CM

## 2016-01-24 DIAGNOSIS — I428 Other cardiomyopathies: Secondary | ICD-10-CM

## 2016-01-25 ENCOUNTER — Ambulatory Visit: Payer: Medicaid Other | Attending: Family Medicine | Admitting: Family Medicine

## 2016-01-25 ENCOUNTER — Telehealth: Payer: Self-pay | Admitting: Cardiology

## 2016-01-25 ENCOUNTER — Encounter: Payer: Self-pay | Admitting: Family Medicine

## 2016-01-25 VITALS — BP 124/74 | HR 69 | Temp 97.8°F | Ht 73.0 in | Wt 244.4 lb

## 2016-01-25 DIAGNOSIS — Z794 Long term (current) use of insulin: Secondary | ICD-10-CM | POA: Insufficient documentation

## 2016-01-25 DIAGNOSIS — Z7982 Long term (current) use of aspirin: Secondary | ICD-10-CM | POA: Insufficient documentation

## 2016-01-25 DIAGNOSIS — M79671 Pain in right foot: Secondary | ICD-10-CM | POA: Diagnosis not present

## 2016-01-25 DIAGNOSIS — Z79899 Other long term (current) drug therapy: Secondary | ICD-10-CM | POA: Diagnosis not present

## 2016-01-25 DIAGNOSIS — M79622 Pain in left upper arm: Secondary | ICD-10-CM | POA: Insufficient documentation

## 2016-01-25 DIAGNOSIS — R079 Chest pain, unspecified: Secondary | ICD-10-CM | POA: Diagnosis not present

## 2016-01-25 DIAGNOSIS — M79672 Pain in left foot: Secondary | ICD-10-CM | POA: Diagnosis not present

## 2016-01-25 DIAGNOSIS — Z87891 Personal history of nicotine dependence: Secondary | ICD-10-CM | POA: Diagnosis not present

## 2016-01-25 DIAGNOSIS — E1142 Type 2 diabetes mellitus with diabetic polyneuropathy: Secondary | ICD-10-CM | POA: Insufficient documentation

## 2016-01-25 DIAGNOSIS — G629 Polyneuropathy, unspecified: Secondary | ICD-10-CM | POA: Diagnosis not present

## 2016-01-25 DIAGNOSIS — I5022 Chronic systolic (congestive) heart failure: Secondary | ICD-10-CM

## 2016-01-25 DIAGNOSIS — G8929 Other chronic pain: Secondary | ICD-10-CM

## 2016-01-25 DIAGNOSIS — G5611 Other lesions of median nerve, right upper limb: Secondary | ICD-10-CM

## 2016-01-25 DIAGNOSIS — I428 Other cardiomyopathies: Secondary | ICD-10-CM

## 2016-01-25 DIAGNOSIS — Z9889 Other specified postprocedural states: Secondary | ICD-10-CM | POA: Diagnosis not present

## 2016-01-25 DIAGNOSIS — R0602 Shortness of breath: Secondary | ICD-10-CM | POA: Diagnosis present

## 2016-01-25 DIAGNOSIS — G5612 Other lesions of median nerve, left upper limb: Secondary | ICD-10-CM

## 2016-01-25 DIAGNOSIS — E118 Type 2 diabetes mellitus with unspecified complications: Secondary | ICD-10-CM

## 2016-01-25 LAB — GLUCOSE, POCT (MANUAL RESULT ENTRY): POC Glucose: 117 mg/dl — AB (ref 70–99)

## 2016-01-25 MED ORDER — TRAMADOL HCL 50 MG PO TABS: 50.0000 mg | ORAL_TABLET | Freq: Three times a day (TID) | ORAL | 0 refills | Status: DC | PRN

## 2016-01-25 NOTE — Progress Notes (Signed)
Patient ID: BEULAH MATUSEK, male   DOB: 1966/03/11, 50 y.o.   MRN: [id number]   Subjective:  Patient ID: DRAPER GALLON, male    DOB: [birth year]  Age: 50 y.o. MRN: [id number]  CC: Back Pain   HPI REI MEDLEN has hx of DM2, systolic CHF he presents for    1. 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 He has achy pain in L upper arm to L upper chest. Pain is constat. Worsened with lying on L side. He reports decreased ROM in L arm. He reports that he did not have PT after his surgery. He has been referred to PT but was unable to go. He has tried tramadol which made him sleepy so he stopped taking it.   2. Foot pain: b/l pain near toes. Achy and sharp pen and needle pain. No injury. No sores. No swelling. He takes gabapentin 300 mg three times daily for pain which helps.   4. SOB: former smoker for 30 years. 2 PPD max. Quit two years ago. No cough. SOB all the time. CP. Hx of "chronic bronchitis" in 65s. He has CHF with improving EF.   Past Surgical History:  Procedure Laterality Date  . CARDIAC CATHETERIZATION  08/10/13  . CHOLECYSTECTOMY  ~ 2010  . IMPLANTABLE CARDIOVERTER DEFIBRILLATOR IMPLANT  12/23/2013   STJ Fortify ICD implanted by [name] for cardiomyopathy and syncope  . IMPLANTABLE CARDIOVERTER DEFIBRILLATOR IMPLANT N/A 12/23/2013   Procedure: IMPLANTABLE CARDIOVERTER DEFIBRILLATOR IMPLANT;  Surgeon: Coralyn Mark, MD;  Location: [facility] CATH LAB;  Service: Cardiovascular;  Laterality: N/A;  . LEFT HEART CATHETERIZATION WITH CORONARY ANGIOGRAM N/A 08/10/2013   Procedure: LEFT HEART CATHETERIZATION WITH CORONARY ANGIOGRAM;  Surgeon: Jettie Booze, MD;  Location: [facility] CATH LAB;  Service: Cardiovascular;  Laterality: N/A;  . ORIF HUMERUS FRACTURE Left 08/12/2013   Procedure: OPEN REDUCTION INTERNAL FIXATION (ORIF) HUMERAL SHAFT FRACTURE;  Surgeon: Newt Minion, MD;  Location: [facility];  Service: Orthopedics;  Laterality: Left;  Open  Reduction Internal Fixation Left Humerus  . URETEROSCOPY     "laser for kidney stones"    Social History  Substance Use Topics  . Smoking status: Former Smoker    Packs/day: 1.00    Years: 28.00    Types: Cigarettes    Quit date: 06/03/2014  . Smokeless tobacco: Never Used  . Alcohol use 0.0 oz/week     Comment: 12/23/2013 "might drink a couple times/yr"    Outpatient Medications Prior to Visit  Medication Sig Dispense Refill  . ACCU-CHEK AVIVA PLUS test strip USE ONE STRIP THREE TIMES DAILY 100 each 12  . aspirin 81 MG tablet Take 81 mg by mouth daily.    . Blood Glucose Monitoring Suppl (ACCU-CHEK AVIVA PLUS) W/DEVICE KIT 1 Device by Does not apply route 3 (three) times daily after meals. 1 kit 0  . carvedilol (COREG) 25 MG tablet Take 1 tablet (25 mg total) by mouth 2 (two) times daily. 180 tablet 3  . furosemide (LASIX) 40 MG tablet Take 2 tablets (80 mg total) by mouth 2 (two) times daily. 360 tablet 3  . gabapentin (NEURONTIN) 300 MG capsule 300 mg in the morning, 300 mg in afternoon and 600 mg in evening 120 capsule 3  . KLOR-CON M20 20 MEQ tablet TAKE ONE TABLET BY MOUTH ONCE DAILY 90 tablet 0  . Lancet Devices (ACCU-CHEK SOFTCLIX) lancets 1 each by Other route  3 (three) times daily. 1 each 0  . metFORMIN (GLUCOPHAGE) 1000 MG tablet 1000 mg in morning, 1500 mg in PM 225 tablet 3  . pantoprazole (PROTONIX) 40 MG tablet TAKE ONE TABLET BY MOUTH ONCE DAILY 30 tablet 5  . sacubitril-valsartan (ENTRESTO) 24-26 MG Take 1 tablet by mouth 2 (two) times daily.    . sitaGLIPtin (JANUVIA) 100 MG tablet Take 1 tablet (100 mg total) by mouth daily. 90 tablet 3  . triamcinolone cream (KENALOG) 0.1 % Apply 1 application topically 2 (two) times daily. 30 g 0  . digoxin (LANOXIN) 0.125 MG tablet Take 1 tablet (0.125 mg total) by mouth daily. (Patient not taking: Reported on 01/25/2016) 90 tablet 3   No facility-administered medications prior to visit.     ROS Review of Systems    Constitutional: Positive for fatigue. Negative for chills, fever and unexpected weight change.  HENT: Negative for congestion and sore throat.   Eyes: Negative for visual disturbance.  Respiratory: Positive for shortness of breath. Negative for cough.   Cardiovascular: Negative for chest pain, palpitations and leg swelling.  Gastrointestinal: Negative for abdominal pain, blood in stool, constipation, diarrhea, nausea and vomiting.  Endocrine: Negative for polydipsia, polyphagia and polyuria.  Musculoskeletal: Positive for arthralgias, back pain and myalgias. Negative for gait problem and neck pain.  Skin: Negative for rash.  Allergic/Immunologic: Negative for immunocompromised state.  Hematological: Negative for adenopathy. Does not bruise/bleed easily.  Psychiatric/Behavioral: Positive for dysphoric mood. Negative for sleep disturbance and suicidal ideas. The patient is not nervous/anxious.     Objective:  BP 124/74 (BP Location: Right Arm, Patient Position: Sitting, Cuff Size: Large)   Pulse 69   Temp 97.8 F (36.6 C) (Oral)   Ht _0  (1.854 m)   Wt 244 lb 6.4 oz (110.9 kg)   SpO2 96%   BMI 32.24 kg/m   BP/Weight 01/25/2016 01/10/2016 02/22/9449  Systolic BP 388 828 003  Diastolic BP 74 72 72  Wt. (Lbs) 244.4 242 241.2  BMI 32.24 31.93 31.83    Physical Exam  Constitutional: He appears well-developed and well-nourished. No distress.  HENT:  Head: Normocephalic and atraumatic.  Neck: Normal range of motion. Neck supple.  Cardiovascular: Normal rate, regular rhythm, normal heart sounds and intact distal pulses.   Pulmonary/Chest: Effort normal and breath sounds normal.  Musculoskeletal: He exhibits no edema.       Left shoulder: He exhibits decreased range of motion.       Left upper arm: He exhibits tenderness and deformity. He exhibits no bony tenderness, no swelling, no edema and no laceration.       Arms:      Right foot: Normal.       Left foot: Normal.  Neurological:  He is alert.  Skin: Skin is warm and dry. No rash noted. No erythema.  Psychiatric: He has a normal mood and affect.    Lab Results  Component Value Date   HGBA1C 7.7 12/17/2015   CBG 117 Assessment & Plan:   Majid was seen today for back pain.  Diagnoses and all orders for this visit:  Controlled type 2 diabetes mellitus with complication, with long-term current use of insulin (HCC) -     Glucose (CBG)  Former smoker -     Pulmonary function test; Future  SOB (shortness of breath) -     Pulmonary function test; Future  Chronic chest pain -     traMADol (ULTRAM) 50 MG tablet; Take 1 tablet (50  mg total) by mouth every 8 (eight) hours as needed.  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    No orders of the defined types were placed in this encounter.   Follow-up: Return in about 2 months (around 03/26/2016) for SOB .   Boykin Nearing MD

## 2016-01-25 NOTE — Progress Notes (Signed)
C/C: patient states both feet feel numb at tip of toe, and lower back also hurts, left arm also hurts.

## 2016-01-25 NOTE — Patient Instructions (Addendum)
[name] was seen today for back pain.  Diagnoses and all orders for this visit:  Controlled type 2 diabetes mellitus with complication, with long-term current use of insulin (HCC) -     Glucose (CBG)  Former smoker -     Pulmonary function test; Future  SOB (shortness of breath) -     Pulmonary function test; Future  Chronic chest pain -     traMADol (ULTRAM) 50 MG tablet; Take 1 tablet (50 mg total) by mouth every 8 (eight) hours as needed.  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   F/u in 8 weeks for shortness of breath and chest pains    Dr. Adrian Blackwater

## 2016-01-25 NOTE — Telephone Encounter (Signed)
Returned call to patient he stated he was returning a call to [employer].Advised I will send message to her.

## 2016-01-25 NOTE — Telephone Encounter (Signed)
New Message  Pt voiced he's returning Jennifer's call.  Please follow up with pt. Thanks!

## 2016-01-28 DIAGNOSIS — R0602 Shortness of breath: Secondary | ICD-10-CM | POA: Insufficient documentation

## 2016-01-28 DIAGNOSIS — R0600 Dyspnea, unspecified: Secondary | ICD-10-CM | POA: Insufficient documentation

## 2016-01-28 NOTE — Assessment & Plan Note (Signed)
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

## 2016-01-28 NOTE — Assessment & Plan Note (Addendum)
Former heavy smoker with SOB, improving CHF, suspect COPD   Plan: Pulm referral for evaluation and PFTs Patient declined albuterol

## 2016-01-28 NOTE — Assessment & Plan Note (Signed)
Chronic pain Start back on tramadol

## 2016-01-29 ENCOUNTER — Encounter: Payer: Self-pay | Admitting: Physician Assistant

## 2016-01-29 ENCOUNTER — Telehealth: Payer: Self-pay | Admitting: Physician Assistant

## 2016-01-29 ENCOUNTER — Telehealth (HOSPITAL_COMMUNITY): Payer: Self-pay | Admitting: Vascular Surgery

## 2016-01-29 ENCOUNTER — Encounter (HOSPITAL_COMMUNITY): Payer: Medicaid Other

## 2016-01-29 DIAGNOSIS — I5022 Chronic systolic (congestive) heart failure: Secondary | ICD-10-CM

## 2016-01-29 MED ORDER — FUROSEMIDE 80 MG PO TABS: ORAL_TABLET | ORAL | 1 refills | Status: DC

## 2016-01-29 MED ORDER — POTASSIUM CHLORIDE CRYS ER 20 MEQ PO TBCR: EXTENDED_RELEASE_TABLET | ORAL | 0 refills | Status: DC

## 2016-01-29 MED ORDER — FUROSEMIDE 40 MG PO TABS: ORAL_TABLET | ORAL | 3 refills | Status: DC

## 2016-01-29 NOTE — Telephone Encounter (Signed)
Returned [employer].  Lasix 40 mg is on mfr backorder.  Changed lasix to 80 mg 1 1/2 tablet bid x's 3 days then back to 1 tablet bid.

## 2016-01-29 NOTE — Telephone Encounter (Signed)
New message         *STAT* If patient is at the pharmacy, call can be transferred to refill team.   1. Which medications need to be refilled? (please list name of each medication and dose if known) furosemide 40mg  2. Which pharmacy/location (including street and city if local pharmacy) is medication to be sent to? [facility] @ [address] 3. Do they need a 30 day or 90 day supply? 90 The 40mg  tablet is on manufacturer back order.  Can you rewrite the presc to get 20mg  tab or 80mg  tab?

## 2016-01-29 NOTE — Telephone Encounter (Signed)
Pt as been made aware to increase Lasix to 40 mg 3 tablets bid X's 3 days then back to 2 tablet bid, and that he needs to take a potassium as well and come in 02/04/16 for repeat bmet. Pt verbalized understanding.

## 2016-01-29 NOTE — Telephone Encounter (Signed)
Called pt to cancel cpx appt due to kristen being sick

## 2016-01-29 NOTE — Telephone Encounter (Signed)
-----   Message from Eileen Stanford, PA-C sent at 01/25/2016  4:25 PM EDT ----- We can increase lasix to 120mg  BID x 3 days and bring in for labs BMET Monday. He should also take an extra Kdur BID

## 2016-02-02 ENCOUNTER — Other Ambulatory Visit: Payer: Self-pay | Admitting: Cardiology

## 2016-02-02 DIAGNOSIS — I429 Cardiomyopathy, unspecified: Secondary | ICD-10-CM

## 2016-02-04 ENCOUNTER — Other Ambulatory Visit: Payer: Medicaid Other

## 2016-02-04 NOTE — Telephone Encounter (Signed)
Rx(s) sent to pharmacy electronically.  

## 2016-02-06 ENCOUNTER — Ambulatory Visit (HOSPITAL_COMMUNITY): Payer: Medicaid Other | Attending: Cardiology

## 2016-02-06 DIAGNOSIS — R079 Chest pain, unspecified: Secondary | ICD-10-CM | POA: Diagnosis not present

## 2016-02-07 ENCOUNTER — Ambulatory Visit (HOSPITAL_COMMUNITY)
Admission: RE | Admit: 2016-02-07 | Discharge: 2016-02-07 | Disposition: A | Discharge status: Home / Self Care | Payer: Medicaid Other | Source: Ambulatory Visit | Attending: Family Medicine | Admitting: Family Medicine

## 2016-02-07 DIAGNOSIS — Z87891 Personal history of nicotine dependence: Secondary | ICD-10-CM | POA: Insufficient documentation

## 2016-02-07 DIAGNOSIS — R0602 Shortness of breath: Secondary | ICD-10-CM | POA: Diagnosis present

## 2016-02-07 LAB — PULMONARY FUNCTION TEST
DL/VA % PRED: 110 %
DL/VA: 5.3 ml/min/mmHg/L
DLCO UNC % PRED: 54 %
DLCO UNC: 19.77 ml/min/mmHg
FEF 25-75 POST: 1.97 L/s
FEF 25-75 PRE: 2.53 L/s
FEF2575-%Change-Post: -22 %
FEF2575-%PRED-POST: 55 %
FEF2575-%PRED-PRE: 70 %
FEV1-%Change-Post: -9 %
FEV1-%PRED-PRE: 70 %
FEV1-%Pred-Post: 64 %
FEV1-Post: 2.36 L
FEV1-Pre: 2.6 L
FEV1FVC-%Change-Post: -6 %
FEV1FVC-%PRED-PRE: 98 %
FEV6-%Change-Post: -2 %
FEV6-%PRED-POST: 71 %
FEV6-%Pred-Pre: 73 %
FEV6-POST: 3.22 L
FEV6-Pre: 3.3 L
FEV6FVC-%CHANGE-POST: 0 %
FEV6FVC-%PRED-POST: 102 %
FEV6FVC-%Pred-Pre: 102 %
FVC-%Change-Post: -2 %
FVC-%Pred-Post: 69 %
FVC-%Pred-Pre: 71 %
FVC-Post: 3.23 L
FVC-Pre: 3.31 L
POST FEV1/FVC RATIO: 73 %
PRE FEV1/FVC RATIO: 79 %
Post FEV6/FVC ratio: 100 %
Pre FEV6/FVC Ratio: 100 %
RV % pred: 102 %
RV: 2.28 L
TLC % PRED: 75 %
TLC: 5.68 L

## 2016-02-07 MED ORDER — ALBUTEROL SULFATE (2.5 MG/3ML) 0.083% IN NEBU
2.5000 mg | INHALATION_SOLUTION | Freq: Once | RESPIRATORY_TRACT | Status: AC
  Administered 2016-02-07: 2.5 mg via RESPIRATORY_TRACT

## 2016-02-13 ENCOUNTER — Ambulatory Visit (INDEPENDENT_AMBULATORY_CARE_PROVIDER_SITE_OTHER): Payer: Medicaid Other | Admitting: *Deleted

## 2016-02-13 ENCOUNTER — Telehealth: Payer: Self-pay | Admitting: Cardiology

## 2016-02-13 ENCOUNTER — Ambulatory Visit (INDEPENDENT_AMBULATORY_CARE_PROVIDER_SITE_OTHER): Payer: Medicaid Other

## 2016-02-13 DIAGNOSIS — Z95810 Presence of automatic (implantable) cardiac defibrillator: Secondary | ICD-10-CM

## 2016-02-13 DIAGNOSIS — I428 Other cardiomyopathies: Secondary | ICD-10-CM

## 2016-02-13 DIAGNOSIS — I5022 Chronic systolic (congestive) heart failure: Secondary | ICD-10-CM

## 2016-02-13 DIAGNOSIS — I429 Cardiomyopathy, unspecified: Secondary | ICD-10-CM

## 2016-02-13 NOTE — Telephone Encounter (Signed)
LMOVM reminding pt to send remote transmission.   

## 2016-02-14 NOTE — Progress Notes (Signed)
Remote ICD transmission.   

## 2016-02-15 ENCOUNTER — Encounter: Payer: Self-pay | Admitting: Cardiology

## 2016-02-15 ENCOUNTER — Telehealth: Payer: Self-pay

## 2016-02-15 NOTE — Progress Notes (Signed)
EPIC Encounter for ICM Monitoring  Patient Name: [name] is a 50 y.o. male Date: 02/15/2016 Primary Care Physican: Minerva Ends, MD Primary Cardiologist: Stanford Breed Electrophysiologist: Allred Dry Weight: unknown        Attempted ICM call and unable to reach.  Transmission reviewed.   Thoracic impedance normal.  Follow-up plan: ICM clinic phone appointment on 03/17/2016.  Copy of ICM check sent to device physician.   ICM trend: 02/13/2016       [name], RN 02/15/2016 3:28 PM

## 2016-02-15 NOTE — Telephone Encounter (Signed)
Remote ICM transmission received.  Attempted patient call and left message for return call.   

## 2016-02-20 ENCOUNTER — Telehealth: Payer: Self-pay | Admitting: Family Medicine

## 2016-02-20 DIAGNOSIS — R942 Abnormal results of pulmonary function studies: Secondary | ICD-10-CM

## 2016-02-20 NOTE — Telephone Encounter (Signed)
Patient would like lab results. Please follow up.

## 2016-02-21 NOTE — Telephone Encounter (Signed)
Waiting on dr Adrian Blackwater to review the lab and write the notes

## 2016-02-24 ENCOUNTER — Other Ambulatory Visit: Payer: Self-pay | Admitting: Family Medicine

## 2016-02-24 DIAGNOSIS — R942 Abnormal results of pulmonary function studies: Secondary | ICD-10-CM | POA: Insufficient documentation

## 2016-02-24 DIAGNOSIS — R0602 Shortness of breath: Secondary | ICD-10-CM

## 2016-02-24 NOTE — Assessment & Plan Note (Signed)
Minimal Obstructive Airways Disease Mild Restriction -Parenchymal Moderately severe Diffusion Defect

## 2016-02-25 LAB — CUP PACEART REMOTE DEVICE CHECK
Date Time Interrogation Session: 20170906194818
HIGH POWER IMPEDANCE MEASURED VALUE: 73 Ohm
HIGH POWER IMPEDANCE MEASURED VALUE: 73 Ohm
Implantable Lead Implant Date: 20150717
Implantable Lead Location: 753860
Lead Channel Impedance Value: 390 Ohm
Lead Channel Pacing Threshold Amplitude: 0.75 V
Lead Channel Pacing Threshold Pulse Width: 0.5 ms
Lead Channel Sensing Intrinsic Amplitude: 11.8 mV
Lead Channel Setting Pacing Pulse Width: 0.5 ms
MDC IDC MSMT BATTERY REMAINING LONGEVITY: 86 mo
MDC IDC MSMT BATTERY REMAINING PERCENTAGE: 81 %
MDC IDC MSMT BATTERY VOLTAGE: 3.01 V
MDC IDC SET LEADCHNL RV PACING AMPLITUDE: 2.5 V
MDC IDC SET LEADCHNL RV SENSING SENSITIVITY: 0.5 mV
MDC IDC STAT BRADY RV PERCENT PACED: 1 %
Pulse Gen Serial Number: 7196009

## 2016-02-27 NOTE — Progress Notes (Signed)
Pt is willing to use the inhaler

## 2016-02-27 NOTE — Telephone Encounter (Signed)
Pt is willing to use the inhaler.

## 2016-02-28 ENCOUNTER — Ambulatory Visit (INDEPENDENT_AMBULATORY_CARE_PROVIDER_SITE_OTHER): Payer: Medicaid Other | Admitting: Pulmonary Disease

## 2016-02-28 ENCOUNTER — Encounter: Payer: Self-pay | Admitting: Pulmonary Disease

## 2016-02-28 VITALS — BP 108/72 | HR 60 | Ht 73.0 in | Wt 250.0 lb

## 2016-02-28 DIAGNOSIS — R0609 Other forms of dyspnea: Secondary | ICD-10-CM

## 2016-02-28 DIAGNOSIS — G4710 Hypersomnia, unspecified: Secondary | ICD-10-CM | POA: Diagnosis not present

## 2016-02-28 DIAGNOSIS — R0600 Dyspnea, unspecified: Secondary | ICD-10-CM | POA: Diagnosis not present

## 2016-02-28 DIAGNOSIS — E669 Obesity, unspecified: Secondary | ICD-10-CM | POA: Diagnosis not present

## 2016-02-28 MED ORDER — LEVALBUTEROL TARTRATE 45 MCG/ACT IN AERO: 1.0000 | INHALATION_SPRAY | Freq: Four times a day (QID) | RESPIRATORY_TRACT | 12 refills | Status: DC | PRN

## 2016-02-28 MED ORDER — ALBUTEROL SULFATE HFA 108 (90 BASE) MCG/ACT IN AERS: 2.0000 | INHALATION_SPRAY | Freq: Four times a day (QID) | RESPIRATORY_TRACT | 1 refills | Status: DC | PRN

## 2016-02-28 NOTE — Progress Notes (Signed)
Subjective:    Patient ID: [name], male    DOB: [birth year], 50 y.o.   MRN: [id number]  HPI   This is the case of [name], 50 y.o. Male, who was referred by Dr. Boykin Nearing in consultation regarding dyspnea.   As you very well know, patient has history of nonischemic cardiomyopathy, chronic CHF with an EF of 25-30 percent. Status post ICD. He follows with Cardiology q2-3 mos.  Heart has been relatively stable. Last CHF flare up was start of 2017.   Patient has a 30PY smoking history, quit 2015. Patient was seeing Dr. Lenna Gilford.  He was diagnosed with chronic bronchitis. Not been dxed with asthma. He is not on MDIs or O2.   He is slowly more SOB x 2 yrs. (-) cp. No recent admit for CHF exacerbation.   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 He takes meds : Tramadol prn. No opiates intake. No benzos intake.   Patient has snoring, has occasional gasping or choking. ? Witnessed apneas as he lives by himself.  Sleeps 7 hrs/night.  Wakes up tired and unrefreshed in am.  (-) HA in am.  (-) abnormal behavior in sleep.     Review of Systems  Constitutional: Negative.  Negative for fever and unexpected weight change.  HENT: Positive for congestion, postnasal drip and rhinorrhea. Negative for dental problem, ear pain, nosebleeds, sinus pressure, sneezing, sore throat and trouble swallowing.   Eyes: Negative.  Negative for redness and itching.  Respiratory: Positive for shortness of breath and wheezing. Negative for cough and chest tightness.   Cardiovascular: Negative.  Negative for palpitations.  Gastrointestinal: Negative.  Negative for nausea and vomiting.  Endocrine: Negative.   Genitourinary: Negative.  Negative for dysuria.  Musculoskeletal: Positive for myalgias. Negative for joint swelling.  Skin: Negative.  Negative for rash.  Allergic/Immunologic: Positive for environmental allergies.  Neurological: Positive for dizziness. Negative for headaches.    Hematological: Negative.   Psychiatric/Behavioral: Negative.  Negative for dysphoric mood. The patient is not nervous/anxious.    Past Medical History:  Diagnosis Date  . Cardiomyopathy ([place])    a. 07/2013 Echo: EF 20-25%, diff HK, Gr2 DD, mild MR, mod dil LA/RA.  Marland Kitchen CHF (congestive heart failure) ([place])   . Chronic bronchitis ([place])    "get it most q yr" (12/23/2013)  . Chronic systolic dysfunction of left ventricle   . Fracture of left humerus    a. 07/2013.  Marland Kitchen GERD (gastroesophageal reflux disease)    not at present time  . Heart murmur    "born w/it"   . History of renal calculi   . Hypertension   . Kidney stones   . Obesity   . Obesity   . 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 Syncope   . Type II diabetes mellitus ([place]) 2006     Family History  Problem Relation Age of Onset  . Diabetes Father   . Arthritis Father   . Diabetes Mother   . Arthritis Mother   . Hypertension Brother      Past Surgical History:  Procedure Laterality Date  . CARDIAC CATHETERIZATION  08/10/13  . CHOLECYSTECTOMY  ~ 2010  . IMPLANTABLE CARDIOVERTER DEFIBRILLATOR IMPLANT  12/23/2013   STJ Fortify ICD implanted by [name] for cardiomyopathy and syncope  . IMPLANTABLE CARDIOVERTER DEFIBRILLATOR IMPLANT N/A 12/23/2013  Procedure: IMPLANTABLE CARDIOVERTER DEFIBRILLATOR IMPLANT;  Surgeon: Coralyn Mark, MD;  Location: [facility] CATH LAB;  Service: Cardiovascular;  Laterality: N/A;  . LEFT HEART CATHETERIZATION WITH CORONARY ANGIOGRAM N/A 08/10/2013   Procedure: LEFT HEART CATHETERIZATION WITH CORONARY ANGIOGRAM;  Surgeon: Jettie Booze, MD;  Location: [facility] CATH LAB;  Service: Cardiovascular;  Laterality: N/A;  . ORIF HUMERUS FRACTURE Left 08/12/2013   Procedure: OPEN REDUCTION INTERNAL FIXATION (ORIF) HUMERAL SHAFT FRACTURE;  Surgeon: Newt Minion, MD;  Location: [facility];  Service:  Orthopedics;  Laterality: Left;  Open Reduction Internal Fixation Left Humerus  . URETEROSCOPY     "laser for kidney stones"    Social History   Social History  . Marital status: Single    Spouse name: N/A  . Number of children: N/A  . Years of education: N/A   Occupational History  . Not on file.   Social History Main Topics  . Smoking status: Former Smoker    Packs/day: 1.00    Years: 28.00    Types: Cigarettes    Quit date: 06/03/2014  . Smokeless tobacco: Never Used  . Alcohol use 0.0 oz/week     Comment: 12/23/2013 "might drink a couple times/yr"  . Drug use:     Types: Marijuana  . Sexual activity: Yes   Other Topics Concern  . Not on file   Social History Narrative  . No narrative on file   Lives in [place], getting disability.  Worked in a Copywriter, advertising. (-) ETOH.   Allergies  Allergen Reactions  . Bee Venom Anaphylaxis     Outpatient Medications Prior to Visit  Medication Sig Dispense Refill  . ACCU-CHEK AVIVA PLUS test strip USE ONE STRIP THREE TIMES DAILY 100 each 12  . aspirin 81 MG tablet Take 81 mg by mouth daily.    . Blood Glucose Monitoring Suppl (ACCU-CHEK AVIVA PLUS) W/DEVICE KIT 1 Device by Does not apply route 3 (three) times daily after meals. 1 kit 0  . carvedilol (COREG) 25 MG tablet TAKE ONE TABLET BY MOUTH TWICE DAILY 180 tablet 3  . digoxin (LANOXIN) 0.125 MG tablet Take 1 tablet (0.125 mg total) by mouth daily. 90 tablet 3  . furosemide (LASIX) 80 MG tablet TAKE 1 1/2 TABLET BY MOUTH TWICE A DAY X'S 3 DAYS THEN TAKE 1 TABLET BY MOUTH TWICE A DAY 189 tablet 1  . gabapentin (NEURONTIN) 300 MG capsule 300 mg in the morning, 300 mg in afternoon and 600 mg in evening 120 capsule 3  . Lancet Devices (ACCU-CHEK SOFTCLIX) lancets 1 each by Other route 3 (three) times daily. 1 each 0  . metFORMIN (GLUCOPHAGE) 1000 MG tablet 1000 mg in morning, 1500 mg in PM 225 tablet 3  . pantoprazole (PROTONIX) 40 MG tablet TAKE ONE TABLET BY MOUTH ONCE DAILY  30 tablet 5  . potassium chloride SA (KLOR-CON M20) 20 MEQ tablet TAKE 1 TABLET BY MOUTH TWICE A DAY X'S 3 DAYS THEN GO BACK TO 1 TABLET DAILY. 90 tablet 0  . sacubitril-valsartan (ENTRESTO) 24-26 MG Take 1 tablet by mouth 2 (two) times daily.    . sitaGLIPtin (JANUVIA) 100 MG tablet Take 1 tablet (100 mg total) by mouth daily. 90 tablet 3  . traMADol (ULTRAM) 50 MG tablet Take 1 tablet (50 mg total) by mouth every 8 (eight) hours as needed. 30 tablet 0  . triamcinolone cream (KENALOG) 0.1 % Apply 1 application topically 2 (two) times daily. 30 g 0  . albuterol (PROVENTIL  HFA;VENTOLIN HFA) 108 (90 Base) MCG/ACT inhaler Inhale 2 puffs into the lungs every 6 (six) hours as needed for wheezing or shortness of breath. 1 Inhaler 1   No facility-administered medications prior to visit.    Meds ordered this encounter  Medications  . levalbuterol (XOPENEX HFA) 45 MCG/ACT inhaler    Sig: Inhale 1-2 puffs into the lungs every 6 (six) hours as needed for wheezing.    Dispense:  1 Inhaler    Refill:  12         Objective:   Physical Exam  Vitals:  Vitals:   02/28/16 1047  BP: 108/72  Pulse: 60  SpO2: 97%  Weight: 250 lb (113.4 kg)  Height: 6' 1"  (1.854 m)    Constitutional/General:  Pleasant, well-nourished, well-developed, not in any distress,  Comfortably seating.  Well kempt  Body mass index is 32.98 kg/m. Wt Readings from Last 3 Encounters:  02/28/16 250 lb (113.4 kg)  01/25/16 244 lb 6.4 oz (110.9 kg)  01/10/16 242 lb (109.8 kg)     HEENT: Pupils equal and reactive to light and accommodation. Anicteric sclerae. Normal nasal mucosa.   No oral  lesions,  mouth clear,  oropharynx clear, no postnasal drip. (-) Oral thrush. No dental caries.  Airway - Mallampati class IV  Neck: No masses. Midline trachea. No JVD, (-) LAD. (-) bruits appreciated.  Respiratory/Chest: Grossly normal chest. (-) deformity. (-) Accessory muscle use.  Symmetric expansion. (-) Tenderness on  palpation.  Resonant on percussion.  Diminished BS on both lower lung zones. (-) wheezing, crackles, rhonchi (-) egophony  Cardiovascular: Regular rate and  rhythm, heart sounds normal, Gr 2 systolic mm at apex. (-) gallops, no peripheral edema  Gastrointestinal:  Normal bowel sounds. Soft, non-tender. No hepatosplenomegaly.  (-) masses.   Musculoskeletal:  Normal muscle tone. Normal gait.   Extremities: Grossly normal. (-) clubbing, cyanosis.  (-) edema  Skin: (-) rash,lesions seen.   Neurological/Psychiatric : alert, oriented to time, place, person. Normal mood and affect         Assessment & Plan:  Exertional dyspnea Patient is being seen for exertional dyspnea.   PFT (02/07/16). FEV1/FVC 79%. FEV1 2.6 or 70% of predicted. Diffusion capacity was 54%.  No evidence of obstruction based on PFT. CXR (2016) cardiomegaly.  Pt has CHF, EF 25-30%, with ICD, for which he follows with Cardiology.   By history, dyspnea is episodic. Sometimes he gets short of breath while sitting down and gets better with rest. Definitely, with exertion, he is always winded.  His exertional dyspnea is multifactorial. 1. Related to his chronic systolic heart failure, EF 25-30%. 2. No evidence of COPD based on's PFT. Sometimes, people can have obstructive ventilatory defect despite normal PFTs when they have restrictive ventilatory defect which can skew the results. Patient has restrictive ventilatory defect related to his obesity. He may still have obstructive ventilatory defect. Patient with significant smoking history. 3. By history, there might be a component of anxiety causing to have him episodic dyspnea especially at rest. 4. He can also have hypoxemia and or hypercapnia making his dyspnea worse. 5. There could be underlying obstructive sleep apnea affecting his breathing.  Plan : 1. I extensively discussed my differentials with the patient. 2. I wanted to try him on Spiriva or an  anticholinergic medicine for a week and see if it helps his breathing better. He has prostate issues so he wanted to hold off. I told him we will switch him to Xopenex MDI  when necessary for a week. I told him to start using it for a week and let us know if he feels better. If he does, we will place him on Symbicort or Dulera for COPD.  3. Patient will need an ABG to check for hypoxemia and hypercapnia. 4. Patient will need a split-night sleep lab study. 5. Patient will need a repeat chest x-ray. 6. He does not want to take influenza or pneumonia vaccines.  Hypersomnia Patient has snoring, has occasional gasping or choking. ? Witnessed apneas as he lives by himself.  Sleeps 7 hrs/night.  Wakes up tired and unrefreshed in am.  (-) HA in am.  (-) abnormal behavior in sleep.  Plan:  We discussed about the diagnosis of Obstructive Sleep Apnea (OSA) and implications of untreated OSA. We discussed about CPAP and BiPaP as possible treatment options.    We will schedule the patient for a sleep study. Plan for a split night sleep study. Anticipate no issues with CPAP. I think both his parents had sleep apnea.   Patient was instructed to call the office if he/she has not heard back from the office 1-2 weeks after the sleep study.   Patient was instructed to call the office if he/she is having issues with the PAP device.   We discussed good sleep hygiene.   Patient was advised not to engage in activities requiring concentration and/or vigilance if he/she is sleepy.  Patient was advised not to drive if he/she is sleepy.    Obesity Weight reduction     Thank you very much for letting me participate in this patient's care. Please do not hesitate to give me a call if you have any questions or concerns regarding the treatment plan.   Patient will follow up with me in 8 weeks.     Monica Becton, MD 02/28/2016   12:31 PM [facility] [place] Pager: [phone] Office: [phone], Fax: [phone]

## 2016-02-28 NOTE — Assessment & Plan Note (Signed)
Patient has snoring, has occasional gasping or choking. ? Witnessed apneas as he lives by himself.  Sleeps 7 hrs/night.  Wakes up tired and unrefreshed in am.  (-) HA in am.  (-) abnormal behavior in sleep.  Plan:  We discussed about the diagnosis of Obstructive Sleep Apnea (OSA) and implications of untreated OSA. We discussed about CPAP and BiPaP as possible treatment options.    We will schedule the patient for a sleep study. Plan for a split night sleep study. Anticipate no issues with CPAP. I think both his parents had sleep apnea.   Patient was instructed to call the office if he/she has not heard back from the office 1-2 weeks after the sleep study.   Patient was instructed to call the office if he/she is having issues with the PAP device.   We discussed good sleep hygiene.   Patient was advised not to engage in activities requiring concentration and/or vigilance if he/she is sleepy.  Patient was advised not to drive if he/she is sleepy.

## 2016-02-28 NOTE — Telephone Encounter (Signed)
Albuterol inhaler ordered Patient can schedule a visit with clinical pharmacology for inhaler teaching once he picks up the inhaler or he can ask his [facility] administrator for teaching

## 2016-02-28 NOTE — Assessment & Plan Note (Addendum)
Patient is being seen for exertional dyspnea.   PFT (02/07/16). FEV1/FVC 79%. FEV1 2.6 or 70% of predicted. Diffusion capacity was 54%.  No evidence of obstruction based on PFT. CXR (2016) cardiomegaly.  Pt has CHF, EF 25-30%, with ICD, for which he follows with Cardiology.   By history, dyspnea is episodic. Sometimes he gets short of breath while sitting down and gets better with rest. Definitely, with exertion, he is always winded.  His exertional dyspnea is multifactorial. 1. Related to his chronic systolic heart failure, EF 25-30%. 2. No evidence of COPD based on's PFT. Sometimes, people can have obstructive ventilatory defect despite normal PFTs when they have restrictive ventilatory defect which can skew the results. Patient has restrictive ventilatory defect related to his obesity. He may still have obstructive ventilatory defect. Patient with significant smoking history. 3. By history, there might be a component of anxiety causing to have him episodic dyspnea especially at rest. 4. He can also have hypoxemia and or hypercapnia making his dyspnea worse. 5. There could be underlying obstructive sleep apnea affecting his breathing.  Plan : 1. I extensively discussed my differentials with the patient. 2. I wanted to try him on Spiriva or an anticholinergic medicine for a week and see if it helps his breathing better. He has prostate issues so he wanted to hold off. I told him we will switch him to Xopenex MDI when necessary for a week. I told him to start using it for a week and let us know if he feels better. If he does, we will place him on Symbicort or Dulera for COPD.  3. Patient will need an ABG to check for hypoxemia and hypercapnia. 4. Patient will need a split-night sleep lab study. 5. Patient will need a repeat chest x-ray. 6. He does not want to take influenza or pneumonia vaccines.

## 2016-02-28 NOTE — Patient Instructions (Addendum)
It was a pleasure taking care of you today!  We will schedule you to have a sleep study to determine if you have sleep apnea.   We will get a lab sleep study.  You will be scheduled to have a lab sleep study in 4-6 weeks.  Someone from the sleep lab will call you in 2-3 days to schedule the study with you.  They usually have cancellations every night so most likely, they will have openings for a lab sleep study next week or so.  We encourage you to do your sleep study then if possible. Please give [place] a call in a week is no one from the sleep lab calls you in 2-3 days.   If the sleep study is positive, we will order you a CPAP  machine.  Please call the office if you do NOT receive your machine in the next 1-2 weeks.   Please make sure you use your CPAP device everytime you sleep.  We will monitor the usage of your machine per your insurance requirement.  Your insurance company may take the machine from you if you are not using it regularly.   Please clean the mask, tubings, filter, water reservoir with soapy water every week.  Please use distilled water for the water reservoir.   Please call the office or your machine provider (DME company) if you are having issues with the device.   We will get a blood test.  Try  Xopenex, 2 puffs every 6-8 hours as needed for dyspnea. Let us know if this helps you.  Return to clinic in 8-10  weeks with Dr. Corrie Dandy

## 2016-02-28 NOTE — Assessment & Plan Note (Signed)
Weight reduction 

## 2016-03-04 ENCOUNTER — Encounter (HOSPITAL_COMMUNITY): Payer: Medicaid Other

## 2016-03-04 NOTE — Telephone Encounter (Signed)
RN advised patient per Dr Adrian Blackwater: Albuterol Inhaler has been ordered; also informed can schedule visit with clinical pharmacology for inhaler teaching once he picks up the Rx or his [facility] pharmacist can teach him.  Patient verbalized understanding.

## 2016-03-05 ENCOUNTER — Other Ambulatory Visit: Payer: Self-pay | Admitting: Cardiology

## 2016-03-05 DIAGNOSIS — I428 Other cardiomyopathies: Secondary | ICD-10-CM

## 2016-03-05 DIAGNOSIS — I519 Heart disease, unspecified: Secondary | ICD-10-CM

## 2016-03-05 NOTE — Telephone Encounter (Signed)
Rx(s) sent to pharmacy electronically.  

## 2016-03-13 ENCOUNTER — Telehealth: Payer: Self-pay | Admitting: Pulmonary Disease

## 2016-03-13 NOTE — Telephone Encounter (Signed)
Received PA request for levalbuterol HFA.  AD would you like to change this medication or would you like [place] to complete the PA for this medication.  Please advise. thanks

## 2016-03-14 NOTE — Telephone Encounter (Signed)
Can you complete PA for xopenex pls?  Thanks.  Monica Becton, MD 03/14/2016, 1:49 AM [facility] Pager [phone] After 3 pm or if no answer, call [phone]

## 2016-03-14 NOTE — Telephone Encounter (Signed)
[place] to obtain PA information.  Medicaid [place] [employer] Member ID# [id number]  Spoke with Will, PA initiated for Xopenex HFA.  Approved [id number] Approved through 03/09/2017  [facility] pharmacy to make them aware that medication is approved.  Pt aware that Xopenex has been approved by insurance. Nothing further needed.

## 2016-03-16 ENCOUNTER — Encounter (HOSPITAL_BASED_OUTPATIENT_CLINIC_OR_DEPARTMENT_OTHER): Payer: Medicaid Other

## 2016-03-17 ENCOUNTER — Telehealth: Payer: Self-pay | Admitting: Cardiology

## 2016-03-17 NOTE — Telephone Encounter (Signed)
LMOVM reminding pt to send remote transmission.   

## 2016-03-20 ENCOUNTER — Telehealth: Payer: Self-pay | Admitting: Cardiology

## 2016-03-20 NOTE — Telephone Encounter (Signed)
LMOVM requesting that pt send manual transmission b/c home monitor has not updated in at least 8 days.   

## 2016-03-25 NOTE — Progress Notes (Signed)
No ICM remote transmission received for 10/9/201 and next ICM transmission scheduled for 04/07/2016.

## 2016-04-02 ENCOUNTER — Encounter: Payer: Self-pay | Admitting: Nurse Practitioner

## 2016-04-07 ENCOUNTER — Telehealth: Payer: Self-pay

## 2016-04-07 ENCOUNTER — Ambulatory Visit (INDEPENDENT_AMBULATORY_CARE_PROVIDER_SITE_OTHER): Payer: Medicaid Other

## 2016-04-07 DIAGNOSIS — I5022 Chronic systolic (congestive) heart failure: Secondary | ICD-10-CM | POA: Diagnosis not present

## 2016-04-07 DIAGNOSIS — Z95810 Presence of automatic (implantable) cardiac defibrillator: Secondary | ICD-10-CM | POA: Diagnosis not present

## 2016-04-07 NOTE — Telephone Encounter (Signed)
Would arrange [place]

## 2016-04-07 NOTE — Telephone Encounter (Signed)
Call to patient to request to send ICM remote transmission today.  He stated his monitor is not working and provided [employer] number.  Have not received a remote transmission to check fluid levels today.  Patient is having fluid symptoms.  He has gained 18 lbs in the last 3 weeks (230 lb to 248 lbs), increase in SOB and swelling in hands and stomach.     Advised would send to Dr Stanford Breed for recommendation in regards to fluid symptoms.   Confirmed he is taking Furosemide 80 mg bid and Potassium 20 mEq 1 tablet daily.   Appointment with Dr Stanford Breed on 04/21/2016.  Labs: 01/10/2016 Creatinine 2.08, BUN 27, Potassium 4.4, Sodium 138  11/01/2015 Creatinine 1.83, BUN 24, Potassium 4.1, Sodium 140  08/06/2015 Creatinine 1.34, BUN 14, Potassium 3.7, Sodium 140  06/19/2015 Creatinine 1.20, BUN 12, Potassium 3.8, Sodium 136  05/31/2015 Creatinine 1.12, BUN 9, Potassium 3.6, Sodium 137

## 2016-04-07 NOTE — Telephone Encounter (Signed)
Call to patient and advised Dr Stanford Breed wants him to schedule office visit with PA.  Advised have sent message to [facility] scheduler to call him for an appointment and if he does not hear anything by tomorrow, then he should call the [place] office.  He stated he would do so.  He will send the ICM remote transmission if possible later today.  Advised if symptoms worsen to call 911 or go to local hospital.

## 2016-04-08 ENCOUNTER — Encounter: Payer: Self-pay | Admitting: Nurse Practitioner

## 2016-04-08 NOTE — Progress Notes (Signed)
EPIC Encounter for ICM Monitoring  Patient Name: [name] is a 50 y.o. male Date: 04/08/2016 Primary Care Physican: Minerva Ends, MD Primary Cardiologist:Crenshaw Electrophysiologist: Allred Dry Weight: 248 lbs on 04/07/2016   Received call from patient on 04/07/2016 reported 18 lb weight gain in the last 3 weeks (230 lb to 248 lbs), increase in SOB and swelling in hands and stomach.                 Thoracic impedance normal except for 03/25/2016 to 03/28/2016.  Confirmed he is taking Furosemide 80 mg bid and Potassium 20 mEq 1 tablet daily.         Labs:       01/10/2016 Creatinine 2.08, BUN 27, Potassium 4.4, Sodium 138        11/01/2015 Creatinine 1.83, BUN 24, Potassium 4.1, Sodium 140        08/06/2015 Creatinine 1.34, BUN 14, Potassium 3.7, Sodium 140        06/19/2015 Creatinine 1.20, BUN 12, Potassium 3.8, Sodium 136        05/31/2015 Creatinine 1.12, BUN 9, Potassium 3.6, Sodium 137  Recommendations:  Dr Stanford Breed recommended patient have an appointment with PA at next available time due to his symptoms.  Called patient 04/07/2016 and informed him of Dr Jacalyn Lefevre recommendation and a scheduler from [place] office would call to set up an appointment. If he did not receive a call by 04/08/2016 then he should call the office.    Follow-up plan: ICM clinic phone appointment on 05/26/2016.  Appointment with Dr Stanford Breed on 04/21/2016.  Appointment with Chanetta Marshall, NP on 04/23/2016.  Copy of ICM check sent to primary cardiologist and device physician.   ICM trend: 04/07/2016       [name], RN 04/08/2016 9:50 AM

## 2016-04-10 ENCOUNTER — Other Ambulatory Visit: Payer: Self-pay

## 2016-04-10 ENCOUNTER — Encounter: Payer: Self-pay | Admitting: Nurse Practitioner

## 2016-04-10 ENCOUNTER — Telehealth: Payer: Self-pay | Admitting: Cardiology

## 2016-04-10 ENCOUNTER — Ambulatory Visit (INDEPENDENT_AMBULATORY_CARE_PROVIDER_SITE_OTHER): Payer: Medicaid Other

## 2016-04-10 DIAGNOSIS — I519 Heart disease, unspecified: Secondary | ICD-10-CM

## 2016-04-10 DIAGNOSIS — I428 Other cardiomyopathies: Secondary | ICD-10-CM

## 2016-04-10 DIAGNOSIS — Z95810 Presence of automatic (implantable) cardiac defibrillator: Secondary | ICD-10-CM

## 2016-04-10 DIAGNOSIS — I5022 Chronic systolic (congestive) heart failure: Secondary | ICD-10-CM

## 2016-04-10 MED ORDER — SACUBITRIL-VALSARTAN 24-26 MG PO TABS: 1.0000 | ORAL_TABLET | Freq: Two times a day (BID) | ORAL | 1 refills | Status: DC

## 2016-04-10 NOTE — Progress Notes (Signed)
Received: Today  Message Contents  Lelon Perla, MD  [name], RN  Cc: Lonn Georgia, PA-C        Pts symptoms are very difficult to assess; he can take 120 mg lasix in AM and 80 PM until seen; check BMET when he is seen in office  [name], MD

## 2016-04-10 NOTE — Progress Notes (Signed)
EPIC Encounter for ICM Monitoring  Patient Name: [name] is a 50 y.o. male Date: 04/10/2016 Primary Care Physican: Minerva Ends, MD Primary Cardiologist:Crenshaw Electrophysiologist: Allred Dry Weight:    261 lbs   Heart Failure questions reviewed, pt symptomatic with swelling of hands and stomach area.  He is reporting weight was 230 lbs about 3 weeks ago.  Reviewed previous office notes since August and weight has been increasing over the last 2 months.  He reported it has increased at least 20 lbs in the last couple of weeks and I am not clear of the accuracy of how much weight he has gained.   Thoracic impedance abnormal suggesting fluid accumulation from 10/17 to 10/20 and again 11/2.  Impedance does not appear to correlate very well with patient's symptoms.    He takes Furosemide 80 mg bid and Potassium 20 mEq 1 tablet daily.           Labs:       01/10/2016 Creatinine 2.08, BUN 27, Potassium 4.4, Sodium 138        11/01/2015 Creatinine 1.83, BUN 24, Potassium 4.1, Sodium 140        08/06/2015 Creatinine 1.34, BUN 14, Potassium 3.7, Sodium 140        06/19/2015 Creatinine 1.20, BUN 12, Potassium 3.8, Sodium 136        12/22/2016Creatinine 1.12, BUN 9, Potassium 3.6, Sodium 137   Recommendations:  Copy of note sent to Dr Stanford Breed, Rosaria Ferries, PA and [name].  Dr Stanford Breed recommended on 10/30 that patient make an appointment for symptoms which is scheduled with Rosaria Ferries, PA on 11/6.   Patient is calling back stating his symptoms are worse.  Requesting recommendation for patient until his appointment on 11/6.    Follow-up plan: ICM clinic phone appointment on 04/14/2016 to recheck fluid levels.   Advised patient to use ER or hospital if symptoms worsen.       ICM trend: 04/10/2016       [name], RN 04/10/2016 2:20 PM

## 2016-04-10 NOTE — Progress Notes (Signed)
Call to patient.  Advised Dr Stanford Breed recommended he take 120 mg lasix in AM and 80 PM until seen on 04/14/2016; check BMET when he is seen in office.  He verbalized understanding.  Advised if he gets worse of weekend to use ER if needed.   Requested he send manual transmission on morning of 04/14/2016.

## 2016-04-10 NOTE — Telephone Encounter (Signed)
Pt called and stated that he has gained 4 lbs over night and 15 lbs over the last 2 weeks. Pt is going to send a remote transmission. Informed pt ICM clinic nurse will call him back once the transmission received. Pt verbalized understanding.

## 2016-04-10 NOTE — Telephone Encounter (Signed)
Spoke with patient.. See ICM note 

## 2016-04-14 ENCOUNTER — Ambulatory Visit (INDEPENDENT_AMBULATORY_CARE_PROVIDER_SITE_OTHER): Payer: Medicaid Other | Admitting: Physician Assistant

## 2016-04-14 ENCOUNTER — Ambulatory Visit (INDEPENDENT_AMBULATORY_CARE_PROVIDER_SITE_OTHER): Payer: Medicaid Other

## 2016-04-14 ENCOUNTER — Encounter: Payer: Self-pay | Admitting: Physician Assistant

## 2016-04-14 VITALS — BP 114/79 | HR 76 | Ht 73.0 in | Wt 254.0 lb

## 2016-04-14 DIAGNOSIS — I5022 Chronic systolic (congestive) heart failure: Secondary | ICD-10-CM

## 2016-04-14 DIAGNOSIS — N183 Chronic kidney disease, stage 3 unspecified: Secondary | ICD-10-CM

## 2016-04-14 DIAGNOSIS — I5043 Acute on chronic combined systolic (congestive) and diastolic (congestive) heart failure: Secondary | ICD-10-CM | POA: Diagnosis not present

## 2016-04-14 DIAGNOSIS — Z95810 Presence of automatic (implantable) cardiac defibrillator: Secondary | ICD-10-CM

## 2016-04-14 MED ORDER — FUROSEMIDE 80 MG PO TABS: ORAL_TABLET | ORAL | 1 refills | Status: DC

## 2016-04-14 NOTE — Progress Notes (Addendum)
EPIC Encounter for ICM Monitoring  Patient Name: [name] is a 50 y.o. male Date: 04/14/2016 Primary Care Physican: Minerva Ends, MD Primary Cardiologist:Crenshaw Electrophysiologist: Allred Dry Weight: 255 lb       Heart Failure questions reviewed, pt reported today he still has some shortness of breath but weight did drop about 6 lbs after taking extra Furosemide as directed.   Patient's fluid symptoms originally started on 10/30 which included shortness of breath, weight gain and stomach bloating since 04/07/2016.  Thoracic impedance returned to normal after taking Furosemide 120 mg am and 80 mg pm as directed by Dr Stanford Breed on 11/3.    Impedance was abnormal suggesting fluid accumulation on the following dates:  10/17 to 10/21 and 11/1 to 11/4.          Labs: 01/10/2016 Creatinine 2.08, BUN 27, Potassium 4.4, Sodium 138  11/01/2015 Creatinine 1.83, BUN 24, Potassium 4.1, Sodium 140  08/06/2015 Creatinine 1.34, BUN 14, Potassium 3.7, Sodium 140  06/19/2015 Creatinine 1.20, BUN 12, Potassium 3.8, Sodium 136  12/22/2016Creatinine 1.12, BUN 9, Potassium 3.6, Sodium 137   Recommendations:  Patient has appointment with Rosaria Ferries, PA today at 3:30 and will provide recommendations if needed.    Follow-up plan: ICM clinic phone appointment on 05/15/2016.  Copy of ICM check sent to cardiology PA, primary cardiologist and device physician.   ICM trend: 04/14/2016       [name], RN 04/14/2016 9:48 AM

## 2016-04-14 NOTE — Patient Instructions (Addendum)
Medication Instructions:  CONTINUE LASIX(120MG  1AND 1/2 TABLETS) AND POTASSIUM(20MEQ) AS YOU ARE TAKING NOW   If you need a refill on your cardiac medications before your next appointment, please call your pharmacy.  Labwork: BMET TODAY AND THEN AGAIN IN 2 WEEKS   Follow-Up: Your physician recommends that you schedule a follow-up appointment in: [place] 04-21-2016   Any Other Special Instructions Will Be Listed Below (If Applicable).    Thank you for choosing [facility]!!

## 2016-04-14 NOTE — Progress Notes (Signed)
Cardiology Office Note   Date:  04/14/2016   ID:  [name], DOB 17-Feb-1966, MRN [id number]  PCP:  Minerva Ends, MD  Cardiologist:  Dr Stanford Breed, Dr Madolyn Frieze, PA-C   Chief Complaint  Patient presents with  . Shortness of Breath    pt stated his wieght has been going up and down.Marland Kitchen    History of Present Illness: [name] is a 50 y.o. male with a history of S-D-CHF, GERD, HTN, DM, obesity, syncope,   10/30, Pt reported gaining 18 lbs in 3 weeks w/ edema.  11/02, Was on Lasix 80 mg bid>>120 mg am, 80 mg pm.  11/06, wt down 6 lbs, breathing a little better.  [name] presents for Ongoing management of his heart failure.  He is compliant with his medications. He gets a little lightheaded at times, but in general is tolerating his medications very well. He tries to be compliant with a low sodium diabetic diet and thinks he does pretty well at this period over the last several months, he notes that he has gained a significant amount of weight. He doesn't quite understand why. He states that he doesn't feel like he's been eating Poorly. He doesn't feel like he has been overusing sodium. He has not had chest pain. He has been aware that he's been urinating more on the increased dose of Lasix and today is the last day for him to be taking the higher dose. He has been tracking his weight carefully and it is down 6 pounds by his scales. He is no longer smoking.  His pants no longer fit well, his rings are hard to get on and off, and he generally does not feel very well. He feels bloated and sluggish. He describes orthopnea. He feels like most of the 30 pounds that he has gained is fluid. He is still walking 3 miles a day, but it is much more strenuous for him to do this.   Past Medical History:  Diagnosis Date  . Cardiomyopathy ([place])    a. 07/2013 Echo: EF 20-25%, diff HK, Gr2 DD, mild MR, mod dil LA/RA.  Marland Kitchen CHF (congestive heart failure) ([place])   . Chronic  bronchitis ([place])    "get it most q yr" (12/23/2013)  . Chronic systolic dysfunction of left ventricle   . Fracture of left humerus    a. 07/2013.  Marland Kitchen GERD (gastroesophageal reflux disease)    not at present time  . Heart murmur    "born w/it"   . History of renal calculi   . Hypertension   . Kidney stones   . Obesity   . 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 Syncope   . Type II diabetes mellitus ([place]) 2006    Past Surgical History:  Procedure Laterality Date  . CARDIAC CATHETERIZATION  08/10/13  . CHOLECYSTECTOMY  ~ 2010  . IMPLANTABLE CARDIOVERTER DEFIBRILLATOR IMPLANT  12/23/2013   STJ Fortify ICD implanted by [name] for cardiomyopathy and syncope  . IMPLANTABLE CARDIOVERTER DEFIBRILLATOR IMPLANT N/A 12/23/2013   Procedure: IMPLANTABLE CARDIOVERTER DEFIBRILLATOR IMPLANT;  Surgeon: Coralyn Mark, MD;  Location: [place] CATH LAB;  Service: Cardiovascular;  Laterality: N/A;  . LEFT HEART CATHETERIZATION WITH CORONARY ANGIOGRAM N/A 08/10/2013   Procedure: LEFT HEART CATHETERIZATION WITH CORONARY ANGIOGRAM;  Surgeon: Jettie Booze, MD;  Location: [facility] CATH LAB;  Service: Cardiovascular;  Laterality: N/A;  . ORIF HUMERUS FRACTURE Left 08/12/2013   Procedure: OPEN REDUCTION INTERNAL FIXATION (ORIF) HUMERAL SHAFT FRACTURE;  Surgeon: Newt Minion, MD;  Location: [place];  Service: Orthopedics;  Laterality: Left;  Open Reduction Internal Fixation Left Humerus  . URETEROSCOPY     "laser for kidney stones"    Current Outpatient Prescriptions  Medication Sig Dispense Refill  . ACCU-CHEK AVIVA PLUS test strip USE ONE STRIP THREE TIMES DAILY 100 each 12  . aspirin 81 MG tablet Take 81 mg by mouth daily.    . Blood Glucose Monitoring Suppl (ACCU-CHEK AVIVA PLUS) W/DEVICE KIT 1 Device by Does not apply route 3 (three) times daily after meals. 1 kit 0  . carvedilol (COREG) 25 MG  tablet TAKE ONE TABLET BY MOUTH TWICE DAILY 180 tablet 3  . digoxin (LANOXIN) 0.125 MG tablet Take 1 tablet (0.125 mg total) by mouth daily. 90 tablet 3  . furosemide (LASIX) 80 MG tablet TAKE 1 1/2 TABLET BY MOUTH TWICE A DAY X'S 3 DAYS THEN TAKE 1 TABLET BY MOUTH TWICE A DAY 189 tablet 1  . gabapentin (NEURONTIN) 300 MG capsule 300 mg in the morning, 300 mg in afternoon and 600 mg in evening 120 capsule 3  . Lancet Devices (ACCU-CHEK SOFTCLIX) lancets 1 each by Other route 3 (three) times daily. 1 each 0  . levalbuterol (XOPENEX HFA) 45 MCG/ACT inhaler Inhale 1-2 puffs into the lungs every 6 (six) hours as needed for wheezing. 1 Inhaler 12  . metFORMIN (GLUCOPHAGE) 1000 MG tablet 1000 mg in morning, 1500 mg in PM 225 tablet 3  . pantoprazole (PROTONIX) 40 MG tablet TAKE ONE TABLET BY MOUTH ONCE DAILY 30 tablet 5  . potassium chloride SA (KLOR-CON M20) 20 MEQ tablet TAKE 1 TABLET BY MOUTH TWICE A DAY X'S 3 DAYS THEN GO BACK TO 1 TABLET DAILY. 90 tablet 0  . sacubitril-valsartan (ENTRESTO) 24-26 MG Take 1 tablet by mouth 2 (two) times daily. 90 tablet 1  . sitaGLIPtin (JANUVIA) 100 MG tablet Take 1 tablet (100 mg total) by mouth daily. 90 tablet 3  . traMADol (ULTRAM) 50 MG tablet Take 1 tablet (50 mg total) by mouth every 8 (eight) hours as needed. 30 tablet 0  . triamcinolone cream (KENALOG) 0.1 % Apply 1 application topically 2 (two) times daily. 30 g 0   No current facility-administered medications for this visit.     Allergies:   Bee venom    Social History:  The patient  reports that he quit smoking about 22 months ago. His smoking use included Cigarettes. He has a 28.00 pack-year smoking history. He has never used smokeless tobacco. He reports that he drinks alcohol. He reports that he uses drugs, including Marijuana.   Family History:  The patient's family history includes Arthritis in his father and mother; Diabetes in his father and mother; Hypertension in his brother.    ROS:   Please see the history of present illness. All other systems are reviewed and negative.    PHYSICAL EXAM: VS:  BP 114/79   Pulse 76   Ht _0  (1.854 m)   Wt 254 lb (115.2 kg)   BMI 33.51 kg/m  , BMI Body mass index is 33.51 kg/m. GEN: Well nourished, well developed, male in no acute distress  HEENT: normal for age  Neck: JVD 8-9 cm, + HJR, no carotid bruit, no masses Cardiac: RRR; soft murmur, no rubs, or gallops Respiratory:  Decreased breath  sounds bases bilaterally, normal work of breathing GI: soft, nontender, nondistended, + BS MS: no deformity or atrophy; no edema; distal pulses are 2+ in all 4 extremities   Skin: warm and dry, no rash Neuro:  Strength and sensation are intact Psych: euthymic mood, full affect   EKG:  EKG is ordered today. The ekg ordered today demonstrates sinus rhythm and no acute ischemic changes   Recent Labs: 01/10/2016: Brain Natriuretic Peptide 4.4; BUN 27; Creat 2.08; Hemoglobin 12.0; Platelets 297; Potassium 4.4; Sodium 138; TSH 1.12    Lipid Panel    Component Value Date/Time   CHOL 147 05/08/2009 1040   TRIG 135.0 05/08/2009 1040   HDL 35.50 (L) 05/08/2009 1040   CHOLHDL 4 05/08/2009 1040   VLDL 27.0 05/08/2009 1040   LDLCALC 85 05/08/2009 1040     Wt Readings from Last 3 Encounters:  04/14/16 254 lb (115.2 kg)  02/28/16 250 lb (113.4 kg)  01/25/16 244 lb 6.4 oz (110.9 kg)     Other studies Reviewed: Additional studies/ records that were reviewed today include: Office notes, hospital records and testing.  ASSESSMENT AND PLAN:  1.  Acute on chronic systolic CHF: His weight is up some, and his volume status needs to improve. However I believe part of the weight he has gained his actual weight and it is not all fluid. He is encouraged to continue tracking his weight carefully. He is to stay on the increased dose of Lasix for now. We will check a BMET today. He will keep his appointment with [name] next week. He will probably  need another BMET at that time. He is encouraged to continue to follow his volume status carefully. He doesn't feel that he needs information on a low sodium diabetic diet. The importance of compliance was emphasized. I do not wish to pull off fluid too fast and overly stress his kidneys, but Zaroxolyn could be used if an additional diuretic boost is needed.  2. Chronic kidney disease: His last BUN and creatinine were elevated at 27/2.08. However, those labs are from August. We will check them again today and further adjust diuretic dosages as needed based on those labs.  3. Lightheaded feeling with dizziness: He is encouraged to seek help her get a blood pressure check whenever he has symptoms like that in the future. I advised him that we can always look at adjusting his blood pressure medications if the doses aren't too elevated and his blood pressure is dropping. He will try to get a blood pressure check the next time this happens.   Current medicines are reviewed at length with the patient today.  The patient does not have concerns regarding medicines.  The following changes have been made:  Continue Lasix at the higher dose, chest potassium levels as needed  Labs/ tests ordered today include:   Orders Placed This Encounter  Procedures  . Basic metabolic panel  . Basic metabolic panel  . EKG 12-Lead     Disposition:   FU with [name]  Signed, Rosaria Ferries, PA-C  04/14/2016 6:15 PM    [facility] Phone: [phone]; Fax: [phone]  This note was written with the assistance of speech recognition software. Please excuse any transcriptional errors.

## 2016-04-15 ENCOUNTER — Telehealth: Payer: Self-pay | Admitting: *Deleted

## 2016-04-15 LAB — BASIC METABOLIC PANEL
BUN: 15 mg/dL (ref 7–25)
CHLORIDE: 101 mmol/L (ref 98–110)
CO2: 30 mmol/L (ref 20–31)
Calcium: 9.3 mg/dL (ref 8.6–10.3)
Creat: 1.58 mg/dL — ABNORMAL HIGH (ref 0.70–1.33)
Glucose, Bld: 116 mg/dL — ABNORMAL HIGH (ref 65–99)
POTASSIUM: 4.1 mmol/L (ref 3.5–5.3)
SODIUM: 140 mmol/L (ref 135–146)

## 2016-04-15 NOTE — Telephone Encounter (Signed)
Prior authorization for entresto started. [id number].

## 2016-04-17 NOTE — Progress Notes (Signed)
HPI: FU CM/CHF. Echo 2/15 revealed EF of 20-25% with diffuse hypokinesis and biatrial enlargement, mild MR and a small pericardial effusion. TSH in February 2015 normal. Cardiac catheterization in March of 2015 showed normal coronary arteries. Echocardiogram August 2017 showed moderate left ventricular hypertrophy, moderate left ventricular enlargement, ejection fraction 40%, grade 1 diastolic dysfunction and moderate left atrial enlargement. CPX August 2017 showed submaximal effort with moderate functional impairment primarily circulatory limited. Since last seen, he describes intermittent dyspnea. However he can walk 2-3 miles at a time. He describes increased abdominal girth but no pedal edema. He occasionally has chest pain for 1-2 seconds.  Current Outpatient Prescriptions  Medication Sig Dispense Refill  . ACCU-CHEK AVIVA PLUS test strip USE ONE STRIP THREE TIMES DAILY 100 each 12  . aspirin 81 MG tablet Take 81 mg by mouth daily.    . Blood Glucose Monitoring Suppl (ACCU-CHEK AVIVA PLUS) W/DEVICE KIT 1 Device by Does not apply route 3 (three) times daily after meals. 1 kit 0  . carvedilol (COREG) 25 MG tablet TAKE ONE TABLET BY MOUTH TWICE DAILY 180 tablet 3  . furosemide (LASIX) 80 MG tablet TAKE 1 1/2 TABLET BY MOUTH TWICE A DAY X'S 3 DAYS THEN TAKE 1 TABLET BY MOUTH TWICE A DAY 189 tablet 1  . gabapentin (NEURONTIN) 300 MG capsule 300 mg in the morning, 300 mg in afternoon and 600 mg in evening 120 capsule 3  . Lancet Devices (ACCU-CHEK SOFTCLIX) lancets 1 each by Other route 3 (three) times daily. 1 each 0  . levalbuterol (XOPENEX HFA) 45 MCG/ACT inhaler Inhale 1-2 puffs into the lungs every 6 (six) hours as needed for wheezing. 1 Inhaler 12  . metFORMIN (GLUCOPHAGE) 1000 MG tablet 1000 mg in morning, 1500 mg in PM 225 tablet 3  . pantoprazole (PROTONIX) 40 MG tablet TAKE ONE TABLET BY MOUTH ONCE DAILY 30 tablet 5  . potassium chloride SA (KLOR-CON M20) 20 MEQ tablet TAKE 1 TABLET  BY MOUTH TWICE A DAY X'S 3 DAYS THEN GO BACK TO 1 TABLET DAILY. 90 tablet 0  . sacubitril-valsartan (ENTRESTO) 24-26 MG Take 1 tablet by mouth 2 (two) times daily. 90 tablet 1  . sitaGLIPtin (JANUVIA) 100 MG tablet Take 1 tablet (100 mg total) by mouth daily. 90 tablet 3  . traMADol (ULTRAM) 50 MG tablet Take 1 tablet (50 mg total) by mouth every 8 (eight) hours as needed. 30 tablet 0  . triamcinolone cream (KENALOG) 0.1 % Apply 1 application topically 2 (two) times daily. 30 g 0   No current facility-administered medications for this visit.      Past Medical History:  Diagnosis Date  . Cardiomyopathy ([place])    a. 07/2013 Echo: EF 20-25%, diff HK, Gr2 DD, mild MR, mod dil LA/RA.  Marland Kitchen CHF (congestive heart failure) ([place])   . Chronic bronchitis ([place])    "get it most q yr" (12/23/2013)  . Chronic systolic dysfunction of left ventricle   . Fracture of left humerus    a. 07/2013.  Marland Kitchen GERD (gastroesophageal reflux disease)    not at present time  . Heart murmur    "born w/it"   . History of renal calculi   . Hypertension   . Kidney stones   . Obesity   . 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 Syncope   . Type II diabetes mellitus (  [place]) 2006    Past Surgical History:  Procedure Laterality Date  . CARDIAC CATHETERIZATION  08/10/13  . CHOLECYSTECTOMY  ~ 2010  . IMPLANTABLE CARDIOVERTER DEFIBRILLATOR IMPLANT  12/23/2013   STJ Fortify ICD implanted by [name] for cardiomyopathy and syncope  . IMPLANTABLE CARDIOVERTER DEFIBRILLATOR IMPLANT N/A 12/23/2013   Procedure: IMPLANTABLE CARDIOVERTER DEFIBRILLATOR IMPLANT;  Surgeon: Coralyn Mark, MD;  Location: [place] CATH LAB;  Service: Cardiovascular;  Laterality: N/A;  . LEFT HEART CATHETERIZATION WITH CORONARY ANGIOGRAM N/A 08/10/2013   Procedure: LEFT HEART CATHETERIZATION WITH CORONARY ANGIOGRAM;  Surgeon: Jettie Booze, MD;   Location: [facility] CATH LAB;  Service: Cardiovascular;  Laterality: N/A;  . ORIF HUMERUS FRACTURE Left 08/12/2013   Procedure: OPEN REDUCTION INTERNAL FIXATION (ORIF) HUMERAL SHAFT FRACTURE;  Surgeon: Newt Minion, MD;  Location: [place];  Service: Orthopedics;  Laterality: Left;  Open Reduction Internal Fixation Left Humerus  . URETEROSCOPY     "laser for kidney stones"    Social History   Social History  . Marital status: Single    Spouse name: N/A  . Number of children: N/A  . Years of education: N/A   Occupational History  . Not on file.   Social History Main Topics  . Smoking status: Former Smoker    Packs/day: 1.00    Years: 28.00    Types: Cigarettes    Quit date: 06/03/2014  . Smokeless tobacco: Never Used  . Alcohol use 0.0 oz/week     Comment: 12/23/2013 "might drink a couple times/yr"  . Drug use:     Types: Marijuana  . Sexual activity: Yes   Other Topics Concern  . Not on file   Social History Narrative  . No narrative on file    Family History  Problem Relation Age of Onset  . Diabetes Father   . Arthritis Father   . Diabetes Mother   . Arthritis Mother   . Hypertension Brother     ROS: no fevers or chills, productive cough, hemoptysis, dysphasia, odynophagia, melena, hematochezia, dysuria, hematuria, rash, seizure activity, orthopnea, PND, pedal edema, claudication. Remaining systems are negative.  Physical Exam: Well-developed well-nourished in no acute distress.  Skin is warm and dry.  HEENT is normal.  Neck is supple.  Chest is clear to auscultation with normal expansion.  Cardiovascular exam is regular rate and rhythm.  Abdominal exam nontender or distended. No masses palpated. Extremities show no edema. neuro grossly intact  A/P  1 nonischemic cardiomyopathy-continue entresto and coreg. Spironolactone discontinued previously because of renal insufficiency. Digoxin was discontinued for this reason as well.   2 chronic chest pain-no further  evaluation. Symptoms not consistent with ischemia.  3 chronic systolic congestive heart failure-patient is difficult to assess as he has multiple symptoms. He does not appear to be volume overloaded on examination. Continue present dose of lasix. Check potassium, renal function and BNP. Continue low sodium diet and fluid restriction. I offered the CHF clinic with possible right heart catheterization but he declined.   4 prior ICD-followed by electrophysiology.   5 chronic stage III kidney disease-follow renal function closely.     [name], MD

## 2016-04-21 ENCOUNTER — Encounter: Payer: Self-pay | Admitting: Cardiology

## 2016-04-21 ENCOUNTER — Ambulatory Visit (INDEPENDENT_AMBULATORY_CARE_PROVIDER_SITE_OTHER): Payer: Medicaid Other | Admitting: Cardiology

## 2016-04-21 VITALS — BP 92/60 | HR 77 | Ht 73.0 in | Wt 253.0 lb

## 2016-04-21 DIAGNOSIS — I428 Other cardiomyopathies: Secondary | ICD-10-CM | POA: Diagnosis not present

## 2016-04-21 DIAGNOSIS — I5022 Chronic systolic (congestive) heart failure: Secondary | ICD-10-CM | POA: Diagnosis not present

## 2016-04-21 DIAGNOSIS — N183 Chronic kidney disease, stage 3 unspecified: Secondary | ICD-10-CM

## 2016-04-21 DIAGNOSIS — Z95810 Presence of automatic (implantable) cardiac defibrillator: Secondary | ICD-10-CM | POA: Diagnosis not present

## 2016-04-21 NOTE — Patient Instructions (Signed)
Medication Instructions:   NO CHANGE  Labwork:  Your physician recommends that you HAVE LAB WORK TODAY  Follow-Up:  Your physician recommends that you schedule a follow-up appointment in: [place]

## 2016-04-23 ENCOUNTER — Encounter: Payer: Medicaid Other | Admitting: Nurse Practitioner

## 2016-04-23 ENCOUNTER — Telehealth: Payer: Self-pay

## 2016-04-23 NOTE — Telephone Encounter (Signed)
LMOM TCB  Pt. Is suppose to come in for an appt tomorrow with AD, but I do not see where he had his sleep study done. I just wanted to verify if he did in fact had it, and that would determine his appt. Tomorrow.

## 2016-04-24 ENCOUNTER — Ambulatory Visit (INDEPENDENT_AMBULATORY_CARE_PROVIDER_SITE_OTHER): Payer: Medicaid Other | Admitting: Pulmonary Disease

## 2016-04-24 ENCOUNTER — Ambulatory Visit (INDEPENDENT_AMBULATORY_CARE_PROVIDER_SITE_OTHER)
Admission: RE | Admit: 2016-04-24 | Discharge: 2016-04-24 | Disposition: A | Discharge status: Home / Self Care | Payer: Medicaid Other | Source: Ambulatory Visit | Attending: Pulmonary Disease | Admitting: Pulmonary Disease

## 2016-04-24 ENCOUNTER — Telehealth: Payer: Self-pay | Admitting: Cardiology

## 2016-04-24 ENCOUNTER — Encounter: Payer: Self-pay | Admitting: Pulmonary Disease

## 2016-04-24 VITALS — BP 112/70 | HR 76 | Ht 73.0 in | Wt 257.0 lb

## 2016-04-24 DIAGNOSIS — E669 Obesity, unspecified: Secondary | ICD-10-CM

## 2016-04-24 DIAGNOSIS — G4710 Hypersomnia, unspecified: Secondary | ICD-10-CM | POA: Diagnosis not present

## 2016-04-24 DIAGNOSIS — R0609 Other forms of dyspnea: Secondary | ICD-10-CM

## 2016-04-24 DIAGNOSIS — R0600 Dyspnea, unspecified: Secondary | ICD-10-CM

## 2016-04-24 NOTE — Progress Notes (Signed)
Subjective:    Patient ID: [name], male    DOB: 28-Jan-1966, 50 y.o.   MRN: [id number]  HPI   This is the case of [name], 50 y.o. Male, who was referred by Dr. Boykin [name] in consultation regarding dyspnea.   As you very well know, patient has history of nonischemic cardiomyopathy, chronic CHF with an EF of 25-30 percent. Status post ICD. He follows with Cardiology q2-3 mos.  Heart has been relatively stable. Last CHF flare up was start of 2017.   Patient has a 30PY smoking history, quit 2015. Patient was seeing Dr. Lenna [name].  He was diagnosed with chronic bronchitis. Not been dxed with asthma. He is not on MDIs or O2.   He is slowly more SOB x 2 yrs. (-) cp. No recent admit for CHF exacerbation.   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 He takes meds : Tramadol prn. No opiates intake. No benzos intake.   Patient has snoring, has occasional gasping or choking. ? Witnessed apneas as he lives by himself.  Sleeps 7 hrs/night.  Wakes up tired and unrefreshed in am.  (-) HA in am.  (-) abnormal behavior in sleep.   ROV 04/25/16 Patient returns to the office as follow-up on his dyspnea and hypersomnia. Since last seen, he feels his dyspnea has slowly gotten worse. He has followed up with his cardiologist. He was advised to increase his Lasix for a week or so. He tried Xopenex for less than a week and his dyspnea was not significantly improved. He has been busy with family issues with several deaths in the family. He has not done tests ordered last visit. He is overwhelmed by his cardiac issues which he is taking up a lot of his time. Has not been admitted nor been on antibiotics since last seen.  Review of Systems  Constitutional: Negative.  Negative for fever and unexpected weight change.  HENT: Negative for congestion, dental problem, ear pain, nosebleeds, postnasal drip, rhinorrhea, sinus pressure, sneezing, sore throat and trouble swallowing.   Eyes: Negative.   Negative for redness and itching.  Respiratory: Positive for shortness of breath. Negative for cough, chest tightness and wheezing.   Cardiovascular: Negative.  Negative for palpitations.  Gastrointestinal: Negative.  Negative for nausea and vomiting.  Endocrine: Negative.   Genitourinary: Negative.  Negative for dysuria.  Musculoskeletal: Positive for myalgias. Negative for joint swelling.  Skin: Negative.  Negative for rash.  Allergic/Immunologic: Positive for environmental allergies.  Neurological: Positive for dizziness. Negative for headaches.  Hematological: Negative.   Psychiatric/Behavioral: Negative.  Negative for dysphoric mood. The patient is not nervous/anxious.        Objective:   Physical Exam  Vitals:  Vitals:   04/24/16 1345  BP: 112/70  Pulse: 76  SpO2: 96%  Weight: 257 lb (116.6 kg)  Height: 6\' 1"  (1.854 m)    Constitutional/General:  Pleasant, well-nourished, well-developed, not in any distress,  Comfortably seating.  Well kempt. Chronically ill  Body mass index is 33.91 kg/m. Wt Readings from Last 3 Encounters:  04/24/16 257 lb (116.6 kg)  04/21/16 253 lb (114.8 kg)  04/14/16 254 lb (115.2 kg)     HEENT: Pupils equal and reactive to light and accommodation. Anicteric sclerae. Normal nasal mucosa.   No oral  lesions,  mouth clear,  oropharynx clear, no postnasal drip. (-) Oral thrush. No dental caries.  Airway - Mallampati class IV  Neck: No masses. Midline trachea.  No JVD, (-) LAD. (-) bruits appreciated.  Respiratory/Chest: Grossly normal chest. (-) deformity. (-) Accessory muscle use.  Symmetric expansion. (-) Tenderness on palpation.  Resonant on percussion.  Diminished BS on both lower lung zones. (-) wheezing, rhonchi Some crackles at the bases.  (-) egophony  Cardiovascular: Regular rate and  rhythm, heart sounds normal, Gr 2 systolic mm at apex. (-) gallops, gr 1  peripheral edema  Gastrointestinal:  Normal bowel sounds. Soft,  non-tender. No hepatosplenomegaly.  (-) masses.   Musculoskeletal:  Normal muscle tone. Normal gait.   Extremities: Grossly normal. (-) clubbing, cyanosis.  (-) edema  Skin: (-) rash,lesions seen.   Neurological/Psychiatric : alert, oriented to time, place, person. Normal mood and affect         Assessment & Plan:  Exertional dyspnea Patient is being seen for exertional dyspnea.   PFT (02/07/16). FEV1/FVC 79%. FEV1 2.6 or 70% of predicted. Diffusion capacity was 54%.  No evidence of obstruction based on PFT. CXR (2016) cardiomegaly.  Pt has CHF, EF 25-30%, with ICD, for which he follows with Cardiology.   By history, dyspnea is episodic. Sometimes he gets short of breath while sitting down and gets better with rest. Definitely, with exertion, he is always winded.  His exertional dyspnea is multifactorial. 1. Related to his chronic systolic heart failure, EF 25-30%. 2. No evidence of COPD based on's PFT. Sometimes, people can have obstructive ventilatory defect despite normal PFTs when they have restrictive ventilatory defect which can skew the results. Patient has restrictive ventilatory defect related to his obesity. He may still have obstructive ventilatory defect. Patient with significant smoking history. 3. By history, there might be a component of anxiety causing to have him episodic dyspnea especially at rest. 4. He can also have hypoxemia and or hypercapnia making his dyspnea worse. 5. There could be underlying obstructive sleep apnea affecting his breathing.  Plan : 1. I extensively discussed my differentials with the patient again.  2. I wanted to try him on Spiriva or an anticholinergic medicine for a week and see if it helps his breathing better. He has prostate issues so he wanted to hold off. He has heart rate issues which prevents me from using LABA (at least he prefers not to). I advised him to use Xopenex and he used it for less than a week and did not feel  better. Will keep xopenex prn for now.  3. Patient will need an ABG to check for hypoxemia and hypercapnia. 4. Ideally, patient will need a split-night sleep lab study. He wanted to hold off on the lab study. He says he says not fall asleep until 2 in the morning. We'll order home sleep test. 5. Patient will need a repeat chest x-ray. 6. He does not want to take influenza or pneumonia vaccines. 7. Need to call pt with results.  He is overwhelemed by his cardiac issues and by the many number of meds he needs to take.   Obesity Weight reduction  Hypersomnia Patient has snoring, has occasional gasping or choking. ? Witnessed apneas as he lives by himself.  Sleeps 7 hrs/night.  Wakes up tired and unrefreshed in am.  (-) HA in am.  (-) abnormal behavior in sleep.  Plan:  We discussed about the diagnosis of Obstructive Sleep Apnea (OSA) and implications of untreated OSA. We discussed about CPAP and BiPaP as possible treatment options.    We will schedule the patient for a sleep study. We ordered  patient a split-night  study back in September but he was too busy with family issues. He wanted to hold off on lab study as he states he does not sleep until 2 in the morning. We wanted to get a home sleep test but insurance will not cover it. We will get a lab study. Patient's parents have sleep apnea. If sleep study is positive, need to call patient and discuss CPAP therapy. He has a lot of things on his plate given his cardiac issues and and medicines that he takes.  Patient was instructed to call the office if he/she has not heard back from the office 1-2 weeks after the sleep study.   Patient was instructed to call the office if he/she is having issues with the PAP device.   We discussed good sleep hygiene.   Patient was advised not to engage in activities requiring concentration and/or vigilance if he/she is sleepy.  Patient was advised not to drive if he/she is sleepy.       Thank you very  much for letting me participate in this patient's care. Please do not hesitate to give me a call if you have any questions or concerns regarding the treatment plan.   Patient will follow up with me in 8 weeks.     [name] Becton, MD 04/24/2016   2:24 PM [facility] [place] Pager: [phone] Office: [phone], Fax: [phone]

## 2016-04-24 NOTE — Assessment & Plan Note (Signed)
Patient is being seen for exertional dyspnea.   PFT (02/07/16). FEV1/FVC 79%. FEV1 2.6 or 70% of predicted. Diffusion capacity was 54%.  No evidence of obstruction based on PFT. CXR (2016) cardiomegaly.  Pt has CHF, EF 25-30%, with ICD, for which he follows with Cardiology.   By history, dyspnea is episodic. Sometimes he gets short of breath while sitting down and gets better with rest. Definitely, with exertion, he is always winded.  His exertional dyspnea is multifactorial. 1. Related to his chronic systolic heart failure, EF 25-30%. 2. No evidence of COPD based on's PFT. Sometimes, people can have obstructive ventilatory defect despite normal PFTs when they have restrictive ventilatory defect which can skew the results. Patient has restrictive ventilatory defect related to his obesity. He may still have obstructive ventilatory defect. Patient with significant smoking history. 3. By history, there might be a component of anxiety causing to have him episodic dyspnea especially at rest. 4. He can also have hypoxemia and or hypercapnia making his dyspnea worse. 5. There could be underlying obstructive sleep apnea affecting his breathing.  Plan : 1. I extensively discussed my differentials with the patient again.  2. I wanted to try him on Spiriva or an anticholinergic medicine for a week and see if it helps his breathing better. He has prostate issues so he wanted to hold off. He has heart rate issues which prevents me from using LABA (at least he prefers not to). I advised him to use Xopenex and he used it for less than a week and did not feel better. Will keep xopenex prn for now.  3. Patient will need an ABG to check for hypoxemia and hypercapnia. 4. Ideally, patient will need a split-night sleep lab study. He wanted to hold off on the lab study. He says he says not fall asleep until 2 in the morning. We'll order home sleep test. 5. Patient will need a repeat chest x-ray. 6. He does not want  to take influenza or pneumonia vaccines. 7. Need to call pt with results.  He is overwhelemed by his cardiac issues and by the many number of meds he needs to take.

## 2016-04-24 NOTE — Patient Instructions (Signed)
It was a pleasure taking care of you today!  We will schedule you to have a sleep study to determine if you have sleep apnea.   We will get a home sleep test.  You will be instructed to come back to the office to get an apparatus to sleep with overnight.  Once we have the apparatus, it will usually take [place] 1-2 weeks to read the study and get back at you with results of the test.  Please give [place] a call in 2 weeks after your study if you do not hear back from [place].   If the sleep study is positive, we will order you a CPAP  machine.  Please call the office if you do NOT receive your machine in the next 1-2 weeks.   Please make sure you use your CPAP device everytime you sleep.  We will monitor the usage of your machine per your insurance requirement.  Your insurance company may take the machine from you if you are not using it regularly.   Please clean the mask, tubings, filter, water reservoir with soapy water every week.  Please use distilled water for the water reservoir.   Please call the office or your machine provider (DME company) if you are having issues with the device.   We will get a chest x-ray as well as a blood test.  Return to clinic in 8 weeks with Dr. Corrie Dandy.

## 2016-04-24 NOTE — Assessment & Plan Note (Signed)
Weight reduction 

## 2016-04-24 NOTE — Assessment & Plan Note (Addendum)
Patient has snoring, has occasional gasping or choking. ? Witnessed apneas as he lives by himself.  Sleeps 7 hrs/night.  Wakes up tired and unrefreshed in am.  (-) HA in am.  (-) abnormal behavior in sleep.  Plan:  We discussed about the diagnosis of Obstructive Sleep Apnea (OSA) and implications of untreated OSA. We discussed about CPAP and BiPaP as possible treatment options.    We will schedule the patient for a sleep study. We ordered  patient a split-night study back in September but he was too busy with family issues. He wanted to hold off on lab study as he states he does not sleep until 2 in the morning. We wanted to get a home sleep test but insurance will not cover it. We will get a lab study. Patient's parents have sleep apnea. If sleep study is positive, need to call patient and discuss CPAP therapy. He has a lot of things on his plate given his cardiac issues and and medicines that he takes.  Patient was instructed to call the office if he/she has not heard back from the office 1-2 weeks after the sleep study.   Patient was instructed to call the office if he/she is having issues with the PAP device.   We discussed good sleep hygiene.   Patient was advised not to engage in activities requiring concentration and/or vigilance if he/she is sleepy.  Patient was advised not to drive if he/she is sleepy.

## 2016-04-24 NOTE — Telephone Encounter (Signed)
Spoke w/ pt and requested that he send a manual transmission b/c his home monitor has not updated in at least 7 days.   

## 2016-04-25 LAB — BASIC METABOLIC PANEL
BUN: 23 mg/dL (ref 7–25)
CHLORIDE: 100 mmol/L (ref 98–110)
CO2: 27 mmol/L (ref 20–31)
Calcium: 9.8 mg/dL (ref 8.6–10.3)
Creat: 1.68 mg/dL — ABNORMAL HIGH (ref 0.70–1.33)
GLUCOSE: 137 mg/dL — AB (ref 65–99)
POTASSIUM: 4.1 mmol/L (ref 3.5–5.3)
Sodium: 140 mmol/L (ref 135–146)

## 2016-04-25 LAB — BRAIN NATRIURETIC PEPTIDE: BRAIN NATRIURETIC PEPTIDE: 8.3 pg/mL (ref <100)

## 2016-05-08 ENCOUNTER — Ambulatory Visit (HOSPITAL_COMMUNITY)
Admission: RE | Admit: 2016-05-08 | Discharge: 2016-05-08 | Disposition: A | Discharge status: Home / Self Care | Payer: Medicaid Other | Source: Ambulatory Visit | Attending: Pulmonary Disease | Admitting: Pulmonary Disease

## 2016-05-08 ENCOUNTER — Other Ambulatory Visit: Payer: Self-pay | Admitting: Physician Assistant

## 2016-05-08 DIAGNOSIS — R0600 Dyspnea, unspecified: Secondary | ICD-10-CM | POA: Diagnosis not present

## 2016-05-08 DIAGNOSIS — R0609 Other forms of dyspnea: Secondary | ICD-10-CM

## 2016-05-08 LAB — BLOOD GAS, ARTERIAL
Acid-base deficit: 0.1 mmol/L (ref 0.0–2.0)
BICARBONATE: 24 mmol/L (ref 20.0–28.0)
DRAWN BY: 242311
FIO2: 21
O2 Saturation: 97.2 %
PATIENT TEMPERATURE: 98.6
PH ART: 7.41 (ref 7.350–7.450)
pCO2 arterial: 38.6 mmHg (ref 32.0–48.0)
pO2, Arterial: 91.2 mmHg (ref 83.0–108.0)

## 2016-05-12 ENCOUNTER — Telehealth: Payer: Self-pay

## 2016-05-12 NOTE — Telephone Encounter (Signed)
OK. Thanks for keep you posted.  Monica Becton, MD 05/12/2016, 5:13 PM [facility] Pager [phone] After 3 pm or if no answer, call [phone]

## 2016-05-12 NOTE — Telephone Encounter (Signed)
AD  Spoke with pt. And his ABG and he wanted to mention to you that he did not want to get the sleep study any more, he stated that he does not usually fall asleep until 3am and that even with him taking benadryl because he have to take it to prevent hives. He wanted to know if that was ok with you. He said he would keep his follow up appointment he has with you in Feb.

## 2016-05-15 ENCOUNTER — Telehealth: Payer: Self-pay | Admitting: Cardiology

## 2016-05-15 NOTE — Telephone Encounter (Signed)
LMOVM reminding pt to send remote transmission.   

## 2016-05-18 ENCOUNTER — Other Ambulatory Visit: Payer: Self-pay | Admitting: Podiatry

## 2016-05-19 NOTE — Progress Notes (Signed)
No ICM remote transmission received on 05/15/2016.   Next ICM transmission scheduled for 05/29/2016.

## 2016-05-20 NOTE — Telephone Encounter (Signed)
Pt needs an appt prior to future refills. 

## 2016-05-29 ENCOUNTER — Ambulatory Visit (INDEPENDENT_AMBULATORY_CARE_PROVIDER_SITE_OTHER): Payer: Medicaid Other

## 2016-05-29 ENCOUNTER — Telehealth: Payer: Self-pay | Admitting: Cardiology

## 2016-05-29 DIAGNOSIS — I5022 Chronic systolic (congestive) heart failure: Secondary | ICD-10-CM

## 2016-05-29 DIAGNOSIS — Z95810 Presence of automatic (implantable) cardiac defibrillator: Secondary | ICD-10-CM | POA: Diagnosis not present

## 2016-05-29 NOTE — Telephone Encounter (Signed)
Spoke with pt and reminded pt of remote transmission that is due today. Pt verbalized understanding.   

## 2016-05-30 NOTE — Progress Notes (Signed)
EPIC Encounter for ICM Monitoring  Patient Name: [name] is a 50 y.o. male Date: 05/30/2016 Primary Care Physican: Minerva Ends, MD Primary Cardiologist:Crenshaw Electrophysiologist: Allred Dry Weight:    243 lb             Heart Failure questions reviewed, pt asymptomatic for fluid symptoms.  He stated he is having chest pain but he has discussed with Dr Stanford Breed.  He stated it lasts about 1-2 seconds and is not any different than what he had before. He is not having any chest pain today.  Encouraged to go to ER if he has more chest pain or if there is change in frequency, intensity or duration.    Thoracic impedance slightly below baseline suggesting fluid accumulation since 05/28/2016.  Recommendations:   He stated he will take extra Furosemide 40 mg as prescribed by Dr Stanford Breed.  Reinforced to limit low salt food choices to 2000 mg day and limiting fluid intake to < 2 liters per day. Encouraged to call for fluid symptoms.  Provided ICM direct number.   Follow-up plan: ICM clinic phone appointment on 06/30/2016.  Office appointment with Dr Stanford Breed 07/16/2016.  Copy of ICM check sent to primary cardiologist and device physician.   ICM trend: 05/29/2016       [name], RN 05/30/2016 8:37 AM

## 2016-06-18 ENCOUNTER — Encounter (HOSPITAL_BASED_OUTPATIENT_CLINIC_OR_DEPARTMENT_OTHER): Payer: Medicaid Other

## 2016-06-30 ENCOUNTER — Ambulatory Visit (INDEPENDENT_AMBULATORY_CARE_PROVIDER_SITE_OTHER): Payer: Medicaid Other

## 2016-06-30 ENCOUNTER — Telehealth: Payer: Self-pay | Admitting: Cardiology

## 2016-06-30 DIAGNOSIS — Z95810 Presence of automatic (implantable) cardiac defibrillator: Secondary | ICD-10-CM

## 2016-06-30 DIAGNOSIS — I5022 Chronic systolic (congestive) heart failure: Secondary | ICD-10-CM | POA: Diagnosis not present

## 2016-06-30 NOTE — Telephone Encounter (Signed)
Spoke with pt and reminded pt of remote transmission that is due today. Pt verbalized understanding.   

## 2016-07-03 ENCOUNTER — Telehealth: Payer: Self-pay

## 2016-07-03 NOTE — Telephone Encounter (Signed)
Remote ICM transmission received.  Attempted patient call and left detailed message regarding transmission and next ICM scheduled for 08/05/2016.  Advised to return call for any fluid symptoms or questions.

## 2016-07-03 NOTE — Progress Notes (Signed)
EPIC Encounter for ICM Monitoring  Patient Name: [name] is a 51 y.o. male Date: 07/03/2016 Primary Care Physican: Minerva Ends, MD Primary Cardiologist:Crenshaw Electrophysiologist: Allred Dry Weight:unknown      Attempted call to patient and unable to reach.  Left detailed message regarding transmission.  Transmission reviewed.   Thoracic impedance normal.  Did appear to have fluid accumulation from 06/20/2016 to 06/23/2016   Recommendations: NONE - Unable to reach patient   Follow-up plan: ICM clinic phone appointment on 08/05/2016.  Office appointment with Dr Stanford Breed on 07/16/2016  Copy of ICM check sent to device physician.   3 month ICM trend: 06/30/2016   Direct Trend Viewer through 07/02/2016   1 Year ICM trend:      [name], RN 07/03/2016 11:16 AM

## 2016-07-11 ENCOUNTER — Encounter: Payer: Self-pay | Admitting: Physician Assistant

## 2016-07-11 ENCOUNTER — Telehealth: Payer: Self-pay | Admitting: Family Medicine

## 2016-07-11 ENCOUNTER — Ambulatory Visit: Payer: Medicaid Other | Attending: Physician Assistant | Admitting: Physician Assistant

## 2016-07-11 VITALS — BP 105/73 | HR 75 | Temp 98.8°F | Resp 24 | Wt 261.4 lb

## 2016-07-11 DIAGNOSIS — R059 Cough, unspecified: Secondary | ICD-10-CM

## 2016-07-11 DIAGNOSIS — E1142 Type 2 diabetes mellitus with diabetic polyneuropathy: Secondary | ICD-10-CM

## 2016-07-11 DIAGNOSIS — R05 Cough: Secondary | ICD-10-CM | POA: Insufficient documentation

## 2016-07-11 DIAGNOSIS — J069 Acute upper respiratory infection, unspecified: Secondary | ICD-10-CM | POA: Insufficient documentation

## 2016-07-11 DIAGNOSIS — R5383 Other fatigue: Secondary | ICD-10-CM

## 2016-07-11 DIAGNOSIS — R5381 Other malaise: Secondary | ICD-10-CM | POA: Diagnosis present

## 2016-07-11 LAB — GLUCOSE, POCT (MANUAL RESULT ENTRY): POC GLUCOSE: 124 mg/dL — AB (ref 70–99)

## 2016-07-11 MED ORDER — BENZONATATE 200 MG PO CAPS: 200.0000 mg | ORAL_CAPSULE | Freq: Two times a day (BID) | ORAL | 0 refills | Status: DC | PRN

## 2016-07-11 MED ORDER — AMOXICILLIN-POT CLAVULANATE 875-125 MG PO TABS: 1.0000 | ORAL_TABLET | Freq: Two times a day (BID) | ORAL | 0 refills | Status: AC

## 2016-07-11 MED ORDER — GUAIFENESIN ER 1200 MG PO TB12: 1.0000 | ORAL_TABLET | Freq: Two times a day (BID) | ORAL | 0 refills | Status: AC

## 2016-07-11 MED ORDER — OSELTAMIVIR PHOSPHATE 75 MG PO CAPS: 75.0000 mg | ORAL_CAPSULE | Freq: Two times a day (BID) | ORAL | 0 refills | Status: AC

## 2016-07-11 NOTE — Addendum Note (Signed)
Addended by: Carilyn Goodpasture on: 07/11/2016 05:36 PM   Modules accepted: Orders

## 2016-07-11 NOTE — Progress Notes (Signed)
Subjective:  Patient ID: [name], male    DOB: 1965-10-20  Age: 51 y.o. MRN: [id number]  CC: malaise  HPI [name] is a 51 y.o. male with a PMH of   Past Medical History:  Diagnosis Date  . Cardiomyopathy ([place])    a. 07/2013 Echo: EF 20-25%, diff HK, Gr2 DD, mild MR, mod dil LA/RA.  Marland Kitchen CHF (congestive heart failure) ([place])   . Chronic bronchitis ([place])    "get it most q yr" (12/23/2013)  . Chronic systolic dysfunction of left ventricle   . Fracture of left humerus    a. 07/2013.  Marland Kitchen GERD (gastroesophageal reflux disease)    not at present time  . Heart murmur    "born w/it"   . History of renal calculi   . Hypertension   . Kidney stones   . Obesity   . 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 Syncope   . Type II diabetes mellitus ([place]) 2006   that presents with a two day history of generalized malaise and fatigue. There is a mild cough. No close contacts with the same. Has taken one pill of Augmentin 875/125 and reports feeling somewhat better. States there is also chest pain but he "always" has chest pain and is no different than baseline. He has a defibrillator and will be seeing cardiology next week. Denies any other symptoms.  Outpatient Medications Prior to Visit  Medication Sig Dispense Refill  . ACCU-CHEK AVIVA PLUS test strip USE ONE STRIP THREE TIMES DAILY 100 each 12  . aspirin 81 MG tablet Take 81 mg by mouth daily.    . Blood Glucose Monitoring Suppl (ACCU-CHEK AVIVA PLUS) W/DEVICE KIT 1 Device by Does not apply route 3 (three) times daily after meals. 1 kit 0  . carvedilol (COREG) 25 MG tablet TAKE ONE TABLET BY MOUTH TWICE DAILY 180 tablet 3  . furosemide (LASIX) 80 MG tablet TAKE 1 1/2 TABLET BY MOUTH TWICE A DAY X'S 3 DAYS THEN TAKE 1 TABLET BY MOUTH TWICE A DAY 189 tablet 1  . gabapentin (NEURONTIN) 300 MG capsule 300 mg in the  morning, 300 mg in afternoon and 600 mg in evening 120 capsule 3  . gabapentin (NEURONTIN) 300 MG capsule TAKE ONE CAPSULE BY MOUTH THREE TIMES DAILY 90 capsule 5  . Lancet Devices (ACCU-CHEK SOFTCLIX) lancets 1 each by Other route 3 (three) times daily. 1 each 0  . levalbuterol (XOPENEX HFA) 45 MCG/ACT inhaler Inhale 1-2 puffs into the lungs every 6 (six) hours as needed for wheezing. 1 Inhaler 12  . metFORMIN (GLUCOPHAGE) 1000 MG tablet 1000 mg in morning, 1500 mg in PM 225 tablet 3  . pantoprazole (PROTONIX) 40 MG tablet TAKE ONE TABLET BY MOUTH ONCE DAILY 30 tablet 5  . potassium chloride SA (KLOR-CON M20) 20 MEQ tablet Take 1 tablet (20 mEq total) by mouth daily. 90 tablet 0  . sacubitril-valsartan (ENTRESTO) 24-26 MG Take 1 tablet by mouth 2 (two) times daily. 90 tablet 1  . sitaGLIPtin (JANUVIA) 100 MG tablet Take 1 tablet (100 mg total) by mouth daily. 90 tablet 3  . triamcinolone cream (KENALOG) 0.1 % Apply 1 application topically 2 (two) times daily. 30 g 0  . traMADol (ULTRAM) 50 MG tablet Take 1 tablet (50 mg total) by mouth every 8 (eight) hours as needed. (Patient not taking: Reported on 07/11/2016)  30 tablet 0   No facility-administered medications prior to visit.     ROS Review of Systems  Constitutional: Negative for chills and fever.  HENT: Positive for congestion, postnasal drip, rhinorrhea, sinus pressure and sneezing. Negative for ear pain, sinus pain and sore throat.   Eyes: Negative for pain.  Respiratory: Positive for cough. Negative for chest tightness.   Cardiovascular: Positive for chest pain (see hpi).  Gastrointestinal: Negative for abdominal pain, nausea and vomiting.  Musculoskeletal: Positive for myalgias (generalized body aches).  Skin: Negative for rash.  Neurological: Negative for headaches.    Objective:  BP 105/73 (BP Location: Right Arm, Patient Position: Sitting, Cuff Size: Large)   Pulse 75   Temp 98.8 F (37.1 C) (Oral)   Resp (!) 24   Wt 261  lb 6.4 oz (118.6 kg)   SpO2 98%   BMI 34.49 kg/m   BP/Weight 07/11/2016 04/24/2016 99/37/1696  Systolic BP 789 381 92  Diastolic BP 73 70 60  Wt. (Lbs) 261.4 257 253  BMI 34.49 33.91 33.38      Physical Exam  Constitutional: He is oriented to person, place, and time.  NAD, overweight, polite  HENT:  Head: Normocephalic and atraumatic.  Right Ear: External ear normal.  Left Ear: External ear normal.  TMs mildly erythematous bilaterally, no bulging or retractions  Neck: Normal range of motion. Neck supple. No thyromegaly present.  Cardiovascular: Normal rate and regular rhythm.   Pulmonary/Chest: Effort normal and breath sounds normal.  Musculoskeletal: He exhibits no edema.  Lymphadenopathy:    He has cervical adenopathy.  Neurological: He is alert and oriented to person, place, and time.  Skin: Skin is warm and dry. No rash noted. He is not diaphoretic.  Psychiatric: He has a normal mood and affect. His behavior is normal.     Assessment & Plan:   1. Upper respiratory tract infection, unspecified type Patient came with an old bottle of Augmentin and stated "this is the only medication that works for me". Patient advised on the inefficacy of abx on viral infections. I could only get him to agree to wait a few more days to self reevaluate for the need to take abx.  - amoxicillin-clavulanate (AUGMENTIN) 875-125 MG tablet; Take 1 tablet by mouth 2 (two) times daily.  Dispense: 20 tablet; Refill: 0 - Guaifenesin (MUCINEX MAXIMUM STRENGTH) 1200 MG TB12; Take 1 tablet (1,200 mg total) by mouth 2 (two) times daily.  Dispense: 10 tablet; Refill: 0 - oseltamivir (TAMIFLU) 75 MG capsule; Take 1 capsule (75 mg total) by mouth 2 (two) times daily.  Dispense: 10 capsule; Refill: 0 - Influenza A and B Ag, Immunoassay  2. Cough - benzonatate (TESSALON) 200 MG capsule; Take 1 capsule (200 mg total) by mouth 2 (two) times daily as needed for cough.  Dispense: 20 capsule; Refill: 0 -  Influenza A and B Ag, Immunoassay  3. Malaise and fatigue - oseltamivir (TAMIFLU) 75 MG capsule; Take 1 capsule (75 mg total) by mouth 2 (two) times daily.  Dispense: 10 capsule; Refill: 0 - Influenza A and B Ag, Immunoassay   Meds ordered this encounter  Medications  . amoxicillin-clavulanate (AUGMENTIN) 875-125 MG tablet    Sig: Take 1 tablet by mouth 2 (two) times daily.    Dispense:  20 tablet    Refill:  0    Order Specific Question:   Supervising Provider    Answer:   Tresa Garter [id number]  . Guaifenesin (MUCINEX MAXIMUM STRENGTH) 1200 MG  TB12    Sig: Take 1 tablet (1,200 mg total) by mouth 2 (two) times daily.    Dispense:  10 tablet    Refill:  0    Order Specific Question:   Supervising Provider    Answer:   Tresa Garter [id number]  . benzonatate (TESSALON) 200 MG capsule    Sig: Take 1 capsule (200 mg total) by mouth 2 (two) times daily as needed for cough.    Dispense:  20 capsule    Refill:  0    Order Specific Question:   Supervising Provider    Answer:   Tresa Garter [id number]  . oseltamivir (TAMIFLU) 75 MG capsule    Sig: Take 1 capsule (75 mg total) by mouth 2 (two) times daily.    Dispense:  10 capsule    Refill:  0    Order Specific Question:   Supervising Provider    Answer:   Tresa Garter [[id number]]    Follow-up: With Dr. Adrian Blackwater as directed previously.  Clent Demark PA

## 2016-07-11 NOTE — Telephone Encounter (Signed)
Pt wanted to have ATB called in for cough. He sates he has had cough for 2 days. Does not feel safe to take OTC medications d/t heart condition. Apt made to see provider today at 4p. Pt aware and verbalized understanding.

## 2016-07-11 NOTE — Patient Instructions (Signed)
Upper Respiratory Infection, Adult Most upper respiratory infections (URIs) are a viral infection of the air passages leading to the lungs. A URI affects the nose, throat, and upper air passages. The most common type of URI is nasopharyngitis and is typically referred to as "the common cold." URIs run their course and usually go away on their own. Most of the time, a URI does not require medical attention, but sometimes a bacterial infection in the upper airways can follow a viral infection. This is called a secondary infection. Sinus and middle ear infections are common types of secondary upper respiratory infections. Bacterial pneumonia can also complicate a URI. A URI can worsen asthma and chronic obstructive pulmonary disease (COPD). Sometimes, these complications can require emergency medical care and may be life threatening. What are the causes? Almost all URIs are caused by viruses. A virus is a type of germ and can spread from one person to another. What increases the risk? You may be at risk for a URI if:  You smoke.  You have chronic heart or lung disease.  You have a weakened defense (immune) system.  You are very young or very old.  You have nasal allergies or asthma.  You work in crowded or poorly ventilated areas.  You work in health care facilities or schools.  What are the signs or symptoms? Symptoms typically develop 2-3 days after you come in contact with a cold virus. Most viral URIs last 7-10 days. However, viral URIs from the influenza virus (flu virus) can last 14-18 days and are typically more severe. Symptoms may include:  Runny or stuffy (congested) nose.  Sneezing.  Cough.  Sore throat.  Headache.  Fatigue.  Fever.  Loss of appetite.  Pain in your forehead, behind your eyes, and over your cheekbones (sinus pain).  Muscle aches.  How is this diagnosed? Your health care provider may diagnose a URI by:  Physical exam.  Tests to check that your  symptoms are not due to another condition such as: ? Strep throat. ? Sinusitis. ? Pneumonia. ? Asthma.  How is this treated? A URI goes away on its own with time. It cannot be cured with medicines, but medicines may be prescribed or recommended to relieve symptoms. Medicines may help:  Reduce your fever.  Reduce your cough.  Relieve nasal congestion.  Follow these instructions at home:  Take medicines only as directed by your health care provider.  Gargle warm saltwater or take cough drops to comfort your throat as directed by your health care provider.  Use a warm mist humidifier or inhale steam from a shower to increase air moisture. This may make it easier to breathe.  Drink enough fluid to keep your urine clear or pale yellow.  Eat soups and other clear broths and maintain good nutrition.  Rest as needed.  Return to work when your temperature has returned to normal or as your health care provider advises. You may need to stay home longer to avoid infecting others. You can also use a face mask and careful hand washing to prevent spread of the virus.  Increase the usage of your inhaler if you have asthma.  Do not use any tobacco products, including cigarettes, chewing tobacco, or electronic cigarettes. If you need help quitting, ask your health care provider. How is this prevented? The best way to protect yourself from getting a cold is to practice good hygiene.  Avoid oral or hand contact with people with cold symptoms.  Wash your   hands often if contact occurs.  There is no clear evidence that vitamin C, vitamin E, echinacea, or exercise reduces the chance of developing a cold. However, it is always recommended to get plenty of rest, exercise, and practice good nutrition. Contact a health care provider if:  You are getting worse rather than better.  Your symptoms are not controlled by medicine.  You have chills.  You have worsening shortness of breath.  You have  brown or red mucus.  You have yellow or brown nasal discharge.  You have pain in your face, especially when you bend forward.  You have a fever.  You have swollen neck glands.  You have pain while swallowing.  You have white areas in the back of your throat. Get help right away if:  You have severe or persistent: ? Headache. ? Ear pain. ? Sinus pain. ? Chest pain.  You have chronic lung disease and any of the following: ? Wheezing. ? Prolonged cough. ? Coughing up blood. ? A change in your usual mucus.  You have a stiff neck.  You have changes in your: ? Vision. ? Hearing. ? Thinking. ? Mood. This information is not intended to replace advice given to you by your health care provider. Make sure you discuss any questions you have with your health care provider. Document Released: 11/19/2000 Document Revised: 01/27/2016 Document Reviewed: 08/31/2013 Elsevier Interactive Patient Education  2017 [employer] Inc.  

## 2016-07-11 NOTE — Progress Notes (Signed)
HPI:FU CM/CHF. Echo 2/15 revealed EF of 20-25% with diffuse hypokinesis and biatrial enlargement, mild MR and a small pericardial effusion. TSH in February 2015 normal. Cardiac catheterization in March of 2015 showed normal coronary arteries. Echocardiogram August 2017 showed moderate left ventricular hypertrophy, moderate left ventricular enlargement, ejection fraction 46%, grade 1 diastolic dysfunction and moderate left atrial enlargement. CPX August 2017 showed submaximal effort with moderate functional impairment primarily circulatory limited. Labs 04/24/16 showed BNP 8.3. Since last seen, patient continues to have sharp pains in his chest occasionally. He notes some dyspnea on exertion and occasional sensations where he gasps for air. He denies increased pedal edema. No syncope.  Current Outpatient Prescriptions  Medication Sig Dispense Refill  . ACCU-CHEK AVIVA PLUS test strip USE ONE STRIP THREE TIMES DAILY 100 each 12  . amoxicillin-clavulanate (AUGMENTIN) 875-125 MG tablet Take 1 tablet by mouth 2 (two) times daily. 20 tablet 0  . aspirin 81 MG tablet Take 81 mg by mouth daily.    . benzonatate (TESSALON) 200 MG capsule Take 1 capsule (200 mg total) by mouth 2 (two) times daily as needed for cough. 20 capsule 0  . Blood Glucose Monitoring Suppl (ACCU-CHEK AVIVA PLUS) W/DEVICE KIT 1 Device by Does not apply route 3 (three) times daily after meals. 1 kit 0  . carvedilol (COREG) 25 MG tablet TAKE ONE TABLET BY MOUTH TWICE DAILY 180 tablet 3  . furosemide (LASIX) 80 MG tablet TAKE 1 1/2 TABLET BY MOUTH TWICE A DAY X'S 3 DAYS THEN TAKE 1 TABLET BY MOUTH TWICE A DAY 189 tablet 1  . gabapentin (NEURONTIN) 300 MG capsule 300 mg in the morning, 300 mg in afternoon and 600 mg in evening 120 capsule 3  . gabapentin (NEURONTIN) 300 MG capsule TAKE ONE CAPSULE BY MOUTH THREE TIMES DAILY 90 capsule 5  . Guaifenesin (MUCINEX MAXIMUM STRENGTH) 1200 MG TB12 Take 1 tablet (1,200 mg total) by mouth 2  (two) times daily. 10 tablet 0  . Lancet Devices (ACCU-CHEK SOFTCLIX) lancets 1 each by Other route 3 (three) times daily. 1 each 0  . levalbuterol (XOPENEX HFA) 45 MCG/ACT inhaler Inhale 1-2 puffs into the lungs every 6 (six) hours as needed for wheezing. 1 Inhaler 12  . metFORMIN (GLUCOPHAGE) 1000 MG tablet 1000 mg in morning, 1500 mg in PM 225 tablet 3  . oseltamivir (TAMIFLU) 75 MG capsule Take 1 capsule (75 mg total) by mouth 2 (two) times daily. 10 capsule 0  . pantoprazole (PROTONIX) 40 MG tablet TAKE ONE TABLET BY MOUTH ONCE DAILY 30 tablet 5  . potassium chloride SA (KLOR-CON M20) 20 MEQ tablet Take 1 tablet (20 mEq total) by mouth daily. 90 tablet 0  . sacubitril-valsartan (ENTRESTO) 24-26 MG Take 1 tablet by mouth 2 (two) times daily. 90 tablet 1  . sitaGLIPtin (JANUVIA) 100 MG tablet Take 1 tablet (100 mg total) by mouth daily. 90 tablet 3  . traMADol (ULTRAM) 50 MG tablet Take 1 tablet (50 mg total) by mouth every 8 (eight) hours as needed. 30 tablet 0  . triamcinolone cream (KENALOG) 0.1 % Apply 1 application topically 2 (two) times daily. 30 g 0   No current facility-administered medications for this visit.      Past Medical History:  Diagnosis Date  . Cardiomyopathy ([place])    a. 07/2013 Echo: EF 20-25%, diff HK, Gr2 DD, mild MR, mod dil LA/RA.  Marland Kitchen CHF (congestive heart failure) ([place])   . Chronic bronchitis ([place])    "  get it most q yr" (12/23/2013)  . Chronic systolic dysfunction of left ventricle   . Fracture of left humerus    a. 07/2013.  Marland Kitchen GERD (gastroesophageal reflux disease)    not at present time  . Heart murmur    "born w/it"   . History of renal calculi   . Hypertension   . Kidney stones   . Obesity   . 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 Syncope   . Type II diabetes mellitus ([place]) 2006    Past Surgical History:  Procedure Laterality  Date  . CARDIAC CATHETERIZATION  08/10/13  . CHOLECYSTECTOMY  ~ 2010  . IMPLANTABLE CARDIOVERTER DEFIBRILLATOR IMPLANT  12/23/2013   STJ Fortify ICD implanted by [name] for cardiomyopathy and syncope  . IMPLANTABLE CARDIOVERTER DEFIBRILLATOR IMPLANT N/A 12/23/2013   Procedure: IMPLANTABLE CARDIOVERTER DEFIBRILLATOR IMPLANT;  Surgeon: Coralyn Mark, MD;  Location: [facility] CATH LAB;  Service: Cardiovascular;  Laterality: N/A;  . LEFT HEART CATHETERIZATION WITH CORONARY ANGIOGRAM N/A 08/10/2013   Procedure: LEFT HEART CATHETERIZATION WITH CORONARY ANGIOGRAM;  Surgeon: Jettie Booze, MD;  Location: [facility] CATH LAB;  Service: Cardiovascular;  Laterality: N/A;  . ORIF HUMERUS FRACTURE Left 08/12/2013   Procedure: OPEN REDUCTION INTERNAL FIXATION (ORIF) HUMERAL SHAFT FRACTURE;  Surgeon: Newt Minion, MD;  Location: [employer] [facility];  Service: Orthopedics;  Laterality: Left;  Open Reduction Internal Fixation Left Humerus  . URETEROSCOPY     "laser for kidney stones"    Social History   Social History  . Marital status: Single    Spouse name: N/A  . Number of children: N/A  . Years of education: N/A   Occupational History  . Not on file.   Social History Main Topics  . Smoking status: Former Smoker    Packs/day: 1.00    Years: 28.00    Types: Cigarettes    Quit date: 06/03/2014  . Smokeless tobacco: Never Used  . Alcohol use 0.0 oz/week     Comment: 12/23/2013 "might drink a couple times/yr"  . Drug use: Yes    Types: Marijuana  . Sexual activity: Yes   Other Topics Concern  . Not on file   Social History Narrative  . No narrative on file    Family History  Problem Relation Age of Onset  . Diabetes Father   . Arthritis Father   . Diabetes Mother   . Arthritis Mother   . Hypertension Brother     ROS: fatigue but no fevers or chills, productive cough, hemoptysis, dysphasia, odynophagia, melena, hematochezia, dysuria, hematuria, rash, seizure activity, orthopnea, PND, pedal edema,  claudication. Remaining systems are negative.  Physical Exam: Well-developed well-nourished in no acute distress.  Skin is warm and dry.  HEENT is normal.  Neck is supple. No bruits  Chest is clear to auscultation with normal expansion.  Cardiovascular exam is regular rate and rhythm.  Abdominal exam nontender or distended. No masses palpated. Extremities show no edema. neuro grossly intact  A/P  1 Nonischemic cardiomyopathy-continue carvedilol and entresto. I will consider advancing  entresto in the future if BP allows. I have not added interest given renal insufficiency.  2 chronic chest pain-patient has had persistent atypical chest pain and symptoms are not felt to be consistent with ischemia. No further evaluation.  3 chronic systolic congestive heart failure-patient symptoms are difficult to evaluate as he has multiple complaints. He  continues to have dyspnea but does not appear to be volume overloaded on examination. Previous BNP normal. I again offered the possibility of advanced heart failure clinic. We also discussed right heart catheterization. However he does not wish to pursue either of these options at this point. Continue present dose of Lasix and follow renal function.  4 prior ICD-followed by electrophysiology.  5 chronic stage III kidney disease check potassium and renal function.-   [name], MD

## 2016-07-11 NOTE — Telephone Encounter (Signed)
Patient states he has a cough for 2 days and would like a prescription to be called in for his cold.  Please follow up

## 2016-07-11 NOTE — Progress Notes (Signed)
CBG 124 

## 2016-07-12 LAB — INFLUENZA A AND B AG, IMMUNOASSAY
INFLUENZA A ANTIGEN: NOT DETECTED
Influenza B Antigen: NOT DETECTED

## 2016-07-14 ENCOUNTER — Telehealth: Payer: Self-pay | Admitting: *Deleted

## 2016-07-14 NOTE — Telephone Encounter (Signed)
Left message to return call 

## 2016-07-15 NOTE — Telephone Encounter (Signed)
Patient returned nurse's call. Pt gave the verbal ok to leave detailed message if he doesn't answer again.   Thank you.

## 2016-07-16 ENCOUNTER — Encounter: Payer: Self-pay | Admitting: Cardiology

## 2016-07-16 ENCOUNTER — Ambulatory Visit (INDEPENDENT_AMBULATORY_CARE_PROVIDER_SITE_OTHER): Payer: Medicaid Other | Admitting: Cardiology

## 2016-07-16 VITALS — BP 122/77 | HR 85 | Ht 73.0 in | Wt 263.0 lb

## 2016-07-16 DIAGNOSIS — I5022 Chronic systolic (congestive) heart failure: Secondary | ICD-10-CM

## 2016-07-16 DIAGNOSIS — I428 Other cardiomyopathies: Secondary | ICD-10-CM

## 2016-07-16 NOTE — Patient Instructions (Signed)
Medication Instructions:   NO CHANGE  Labwork:  Your physician recommends that you return for lab work WHEN CONVENIENT   Follow-Up:  Your physician recommends that you schedule a follow-up appointment in: [place]

## 2016-07-17 ENCOUNTER — Other Ambulatory Visit: Payer: Self-pay | Admitting: Family Medicine

## 2016-07-17 DIAGNOSIS — K219 Gastro-esophageal reflux disease without esophagitis: Secondary | ICD-10-CM

## 2016-07-21 ENCOUNTER — Ambulatory Visit: Payer: Medicaid Other | Attending: Family Medicine | Admitting: Family Medicine

## 2016-07-21 ENCOUNTER — Encounter: Payer: Self-pay | Admitting: Family Medicine

## 2016-07-21 ENCOUNTER — Other Ambulatory Visit: Payer: Medicaid Other | Admitting: *Deleted

## 2016-07-21 VITALS — BP 110/73 | HR 84 | Temp 98.0°F | Ht 73.0 in | Wt 262.6 lb

## 2016-07-21 DIAGNOSIS — M545 Low back pain: Secondary | ICD-10-CM | POA: Diagnosis not present

## 2016-07-21 DIAGNOSIS — Z794 Long term (current) use of insulin: Secondary | ICD-10-CM

## 2016-07-21 DIAGNOSIS — Z79899 Other long term (current) drug therapy: Secondary | ICD-10-CM | POA: Diagnosis not present

## 2016-07-21 DIAGNOSIS — I428 Other cardiomyopathies: Secondary | ICD-10-CM

## 2016-07-21 DIAGNOSIS — Z87891 Personal history of nicotine dependence: Secondary | ICD-10-CM | POA: Diagnosis not present

## 2016-07-21 DIAGNOSIS — M79674 Pain in right toe(s): Secondary | ICD-10-CM | POA: Diagnosis not present

## 2016-07-21 DIAGNOSIS — E118 Type 2 diabetes mellitus with unspecified complications: Secondary | ICD-10-CM

## 2016-07-21 DIAGNOSIS — G8929 Other chronic pain: Secondary | ICD-10-CM

## 2016-07-21 DIAGNOSIS — Z7982 Long term (current) use of aspirin: Secondary | ICD-10-CM | POA: Diagnosis not present

## 2016-07-21 DIAGNOSIS — Z0001 Encounter for general adult medical examination with abnormal findings: Secondary | ICD-10-CM | POA: Insufficient documentation

## 2016-07-21 LAB — POCT GLYCOSYLATED HEMOGLOBIN (HGB A1C): Hemoglobin A1C: 7.6

## 2016-07-21 LAB — GLUCOSE, POCT (MANUAL RESULT ENTRY): POC GLUCOSE: 146 mg/dL — AB (ref 70–99)

## 2016-07-21 MED ORDER — ACETAMINOPHEN-CODEINE #3 300-30 MG PO TABS: 1.0000 | ORAL_TABLET | Freq: Three times a day (TID) | ORAL | 2 refills | Status: DC | PRN

## 2016-07-21 MED ORDER — ACETAMINOPHEN-CODEINE #3 300-30 MG PO TABS: 1.0000 | ORAL_TABLET | Freq: Three times a day (TID) | ORAL | 0 refills | Status: DC | PRN

## 2016-07-21 MED ORDER — CYCLOBENZAPRINE HCL 5 MG PO TABS: 5.0000 mg | ORAL_TABLET | Freq: Three times a day (TID) | ORAL | 1 refills | Status: DC | PRN

## 2016-07-21 MED ORDER — LUMBAR BACK BRACE/SUPPORT PAD MISC: 1.0000 | Freq: Every day | 0 refills | Status: DC

## 2016-07-21 NOTE — Progress Notes (Signed)
Subjective:  Patient ID: [name], male    DOB: May 04, 1966  Age: 51 y.o. MRN: [id number]  CC: Back Pain   HPI [name] has diabetes, chronic systolic CHF s/p ICD placement he presents for    1. Chronic low back pain: pain started in about year ago. There was injury right before the pain. He did have a fall two years ago. Where he feel back and broke his left arm.  Pain is located in  low back. Bilateral low back pain.  Does seem to radiate at times down hips and to gluteal. Exacerbated by standing and leaning forward like while brushing his teeth and coughing. Pains are sharp and throbbing. He takes gabapentin without improvement in his pain. He tried tramadol which only made him tired.  He had fecal  Incontinence in the early morning which he attributes to metformin . He denies urinary incontinence. He denies groin numbness. He has weakness in his legs sometimes. Current pain level is 10/10 in his back. Pain interferes with walking. He walks unassisted.   2. Diabetes: he walks 3 miles per day. He eats low carb. Compliant with metformin. Has loose stools in the evening. Has pain in great toes at times.   Social History  Substance Use Topics  . Smoking status: Former Smoker    Packs/day: 1.00    Years: 28.00    Types: Cigarettes    Quit date: 06/03/2014  . Smokeless tobacco: Never Used  . Alcohol use 0.0 oz/week     Comment: 12/23/2013 "might drink a couple times/yr"   Outpatient Medications Prior to Visit  Medication Sig Dispense Refill  . ACCU-CHEK AVIVA PLUS test strip USE ONE STRIP THREE TIMES DAILY 100 each 12  . amoxicillin-clavulanate (AUGMENTIN) 875-125 MG tablet Take 1 tablet by mouth 2 (two) times daily. 20 tablet 0  . aspirin 81 MG tablet Take 81 mg by mouth daily.    . benzonatate (TESSALON) 200 MG capsule Take 1 capsule (200 mg total) by mouth 2 (two) times daily as needed for cough. 20 capsule 0  . Blood Glucose Monitoring Suppl (ACCU-CHEK AVIVA PLUS) W/DEVICE  KIT 1 Device by Does not apply route 3 (three) times daily after meals. 1 kit 0  . carvedilol (COREG) 25 MG tablet TAKE ONE TABLET BY MOUTH TWICE DAILY 180 tablet 3  . furosemide (LASIX) 80 MG tablet TAKE 1 1/2 TABLET BY MOUTH TWICE A DAY X'S 3 DAYS THEN TAKE 1 TABLET BY MOUTH TWICE A DAY 189 tablet 1  . gabapentin (NEURONTIN) 300 MG capsule 300 mg in the morning, 300 mg in afternoon and 600 mg in evening 120 capsule 3  . gabapentin (NEURONTIN) 300 MG capsule TAKE ONE CAPSULE BY MOUTH THREE TIMES DAILY 90 capsule 5  . Lancet Devices (ACCU-CHEK SOFTCLIX) lancets 1 each by Other route 3 (three) times daily. 1 each 0  . levalbuterol (XOPENEX HFA) 45 MCG/ACT inhaler Inhale 1-2 puffs into the lungs every 6 (six) hours as needed for wheezing. 1 Inhaler 12  . metFORMIN (GLUCOPHAGE) 1000 MG tablet 1000 mg in morning, 1500 mg in PM 225 tablet 3  . pantoprazole (PROTONIX) 40 MG tablet TAKE ONE TABLET BY MOUTH ONCE DAILY 90 tablet 0  . potassium chloride SA (KLOR-CON M20) 20 MEQ tablet Take 1 tablet (20 mEq total) by mouth daily. 90 tablet 0  . sacubitril-valsartan (ENTRESTO) 24-26 MG Take 1 tablet by mouth 2 (two) times daily. 90 tablet 1  . sitaGLIPtin (JANUVIA) 100  MG tablet Take 1 tablet (100 mg total) by mouth daily. 90 tablet 3  . traMADol (ULTRAM) 50 MG tablet Take 1 tablet (50 mg total) by mouth every 8 (eight) hours as needed. 30 tablet 0  . triamcinolone cream (KENALOG) 0.1 % Apply 1 application topically 2 (two) times daily. 30 g 0   No facility-administered medications prior to visit.     ROS Review of Systems  Constitutional: Negative for chills, fatigue, fever and unexpected weight change.  Eyes: Negative for visual disturbance.  Respiratory: Negative for cough and shortness of breath.   Cardiovascular: Negative for chest pain, palpitations and leg swelling.  Gastrointestinal: Negative for abdominal pain, blood in stool, constipation, diarrhea, nausea and vomiting.  Endocrine: Negative  for polydipsia, polyphagia and polyuria.  Musculoskeletal: Negative for arthralgias, back pain, gait problem, myalgias and neck pain.  Skin: Negative for rash.  Allergic/Immunologic: Negative for immunocompromised state.  Hematological: Negative for adenopathy. Does not bruise/bleed easily.  Psychiatric/Behavioral: Negative for dysphoric mood, sleep disturbance and suicidal ideas. The patient is not nervous/anxious.     Objective:  BP 110/73 (BP Location: Left Arm, Patient Position: Sitting, Cuff Size: Small)   Pulse 84   Temp 98 F (36.7 C) (Oral)   Ht 6' 1"  (1.854 m)   Wt 262 lb 9.6 oz (119.1 kg)   SpO2 95%   BMI 34.65 kg/m   BP/Weight 07/21/2016 01/14/3253 02/15/2640  Systolic BP 583 094 076  Diastolic BP 73 77 73  Wt. (Lbs) 262.6 263 261.4  BMI 34.65 34.7 34.49   Wt Readings from Last 3 Encounters:  07/21/16 262 lb 9.6 oz (119.1 kg)  07/16/16 263 lb (119.3 kg)  07/11/16 261 lb 6.4 oz (118.6 kg)   Physical Exam  Constitutional: He appears well-developed and well-nourished. No distress.  HENT:  Head: Normocephalic and atraumatic.  Neck: Normal range of motion. Neck supple.  Cardiovascular: Normal rate, regular rhythm, normal heart sounds and intact distal pulses.   Pulmonary/Chest: Effort normal and breath sounds normal.  Musculoskeletal: He exhibits no edema.       Right foot: Normal.       Left foot: Normal.       Feet:  Back Exam: Back: Normal Curvature, no deformities or CVA tenderness  Paraspinal Tenderness: lumbar   LE Strength 5/5  LE Sensation: in tact  LE Reflexes 2+ and symmetric  Straight leg raise: negative    Neurological: He is alert.  Skin: Skin is warm and dry. No rash noted. No erythema.  Psychiatric: He has a normal mood and affect.    Lab Results  Component Value Date   HGBA1C 7.7 12/17/2015   Lab Results  Component Value Date   HGBA1C 7.6 07/21/2016    CBG 146  Assessment & Plan:   Lennie was seen today for back pain.  Diagnoses and  all orders for this visit:  Controlled type 2 diabetes mellitus with complication, with long-term current use of insulin (HCC) -     POCT glucose (manual entry) -     Cancel: POCT glycosylated hemoglobin (Hb A1C) -     Hemoglobin A1c -     POCT glycosylated hemoglobin (Hb A1C)  Chronic bilateral low back pain without sciatica -     DG Lumbar Spine 2-3 Views; Future -     Discontinue: acetaminophen-codeine (TYLENOL #3) 300-30 MG tablet; Take 1 tablet by mouth every 8 (eight) hours as needed for moderate pain. -     Ambulatory referral to Orthopedic  Surgery -     acetaminophen-codeine (TYLENOL #3) 300-30 MG tablet; Take 1 tablet by mouth every 8 (eight) hours as needed for moderate pain. -     cyclobenzaprine (FLEXERIL) 5 MG tablet; Take 1 tablet (5 mg total) by mouth 3 (three) times daily as needed for muscle spasms. -     Elastic Bandages & Supports (LUMBAR BACK BRACE/SUPPORT PAD) MISC; 1 each by Does not apply route daily. ICD 10 M54.5 -     Ambulatory referral to Physical Therapy   No orders of the defined types were placed in this encounter.   Follow-up: Return in about 4 weeks (around 08/18/2016) for low back pain .   Boykin Nearing MD

## 2016-07-21 NOTE — Progress Notes (Signed)
Pt is here for lower back pain. Pt has been having pain for 1 month now. Refill on tramadol.

## 2016-07-21 NOTE — Patient Instructions (Addendum)
[name] was seen today for back pain.  Diagnoses and all orders for this visit:  Controlled type 2 diabetes mellitus with complication, with long-term current use of insulin (HCC) -     POCT glucose (manual entry) -     Cancel: POCT glycosylated hemoglobin (Hb A1C) -     Hemoglobin A1c  Chronic bilateral low back pain without sciatica -     DG Lumbar Spine 2-3 Views; Future -     Discontinue: acetaminophen-codeine (TYLENOL #3) 300-30 MG tablet; Take 1 tablet by mouth every 8 (eight) hours as needed for moderate pain. -     Ambulatory referral to Orthopedic Surgery -     acetaminophen-codeine (TYLENOL #3) 300-30 MG tablet; Take 1 tablet by mouth every 8 (eight) hours as needed for moderate pain. -     cyclobenzaprine (FLEXERIL) 5 MG tablet; Take 1 tablet (5 mg total) by mouth 3 (three) times daily as needed for muscle spasms. -     Elastic Bandages & Supports (LUMBAR BACK BRACE/SUPPORT PAD) MISC; 1 each by Does not apply route daily. ICD 10 M54.5 -     Ambulatory referral to Physical Therapy  take Rx for back brace to [place] or [place]   F/u in 4 weeks for low back pain   Dr. Adrian Blackwater

## 2016-07-22 DIAGNOSIS — M79674 Pain in right toe(s): Secondary | ICD-10-CM | POA: Insufficient documentation

## 2016-07-22 LAB — BASIC METABOLIC PANEL
BUN/Creatinine Ratio: 12 (ref 9–20)
BUN: 19 mg/dL (ref 6–24)
CALCIUM: 9.8 mg/dL (ref 8.7–10.2)
CHLORIDE: 101 mmol/L (ref 96–106)
CO2: 24 mmol/L (ref 18–29)
Creatinine, Ser: 1.53 mg/dL — ABNORMAL HIGH (ref 0.76–1.27)
GFR, EST AFRICAN AMERICAN: 60 mL/min/{1.73_m2} (ref >59)
GFR, EST NON AFRICAN AMERICAN: 52 mL/min/{1.73_m2} — AB (ref >59)
Glucose: 137 mg/dL — ABNORMAL HIGH (ref 65–99)
Potassium: 4.5 mmol/L (ref 3.5–5.2)
Sodium: 142 mmol/L (ref 134–144)

## 2016-07-22 NOTE — Assessment & Plan Note (Signed)
Diabetes remains fairly well controlled A1c slightly above goal but improving Encouraged continue exercise and weight loss

## 2016-07-22 NOTE — Assessment & Plan Note (Signed)
Chronic pain in low back with intermittent radicular symptoms Plan: Lumbar x-ray Patient has ICD, so will need to consult his electrophysiologist/cardiologist before any advance imaging can be obtained if needed  Advised: continue exercise, PT,  back bracing For pain: tylenol #3, flexeril prn

## 2016-07-28 ENCOUNTER — Encounter: Payer: Self-pay | Admitting: Physical Therapy

## 2016-07-28 ENCOUNTER — Ambulatory Visit: Payer: Medicaid Other | Attending: Family Medicine | Admitting: Physical Therapy

## 2016-07-28 DIAGNOSIS — G8929 Other chronic pain: Secondary | ICD-10-CM | POA: Diagnosis present

## 2016-07-28 DIAGNOSIS — M545 Low back pain: Secondary | ICD-10-CM | POA: Diagnosis not present

## 2016-07-28 NOTE — Therapy (Signed)
[place], [place], [address] Phone: [phone]   Fax:  [phone]  Physical Therapy Treatment  Patient Details  Name: [name] MRN: [id number] Date of Birth: 08/03/1965 Referring Provider: Boykin Nearing, MD  Encounter Date: 07/28/2016      PT End of Session - 07/28/16 1457    Visit Number 1   Authorization Type one-time Medicaid Evaluation   PT Start Time 1415   PT Stop Time 1457   PT Time Calculation (min) 42 min   Activity Tolerance Patient limited by lethargy   Behavior During Therapy --  lethargic      Past Medical History:  Diagnosis Date  . Cardiomyopathy ([place])    a. 07/2013 Echo: EF 20-25%, diff HK, Gr2 DD, mild MR, mod dil LA/RA.  Marland Kitchen CHF (congestive heart failure) ([place])   . Chronic bronchitis ([place])    "get it most q yr" (12/23/2013)  . Chronic systolic dysfunction of left ventricle   . Fracture of left humerus    a. 07/2013.  Marland Kitchen GERD (gastroesophageal reflux disease)    not at present time  . Heart murmur    "born w/it"   . History of renal calculi   . Hypertension   . Kidney stones   . Obesity   . 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 Syncope   . Type II diabetes mellitus ([place]) 2006    Past Surgical History:  Procedure Laterality Date  . CARDIAC CATHETERIZATION  08/10/13  . CHOLECYSTECTOMY  ~ 2010  . IMPLANTABLE CARDIOVERTER DEFIBRILLATOR IMPLANT  12/23/2013   STJ Fortify ICD implanted by [name] for cardiomyopathy and syncope  . IMPLANTABLE CARDIOVERTER DEFIBRILLATOR IMPLANT N/A 12/23/2013   Procedure: IMPLANTABLE CARDIOVERTER DEFIBRILLATOR IMPLANT;  Surgeon: Coralyn Mark, MD;  Location: [facility] CATH LAB;  Service: Cardiovascular;  Laterality: N/A;  . LEFT HEART CATHETERIZATION WITH CORONARY ANGIOGRAM N/A 08/10/2013   Procedure: LEFT HEART  CATHETERIZATION WITH CORONARY ANGIOGRAM;  Surgeon: Jettie Booze, MD;  Location: [facility] CATH LAB;  Service: Cardiovascular;  Laterality: N/A;  . ORIF HUMERUS FRACTURE Left 08/12/2013   Procedure: OPEN REDUCTION INTERNAL FIXATION (ORIF) HUMERAL SHAFT FRACTURE;  Surgeon: Newt Minion, MD;  Location: [facility];  Service: Orthopedics;  Laterality: Left;  Open Reduction Internal Fixation Left Humerus  . URETEROSCOPY     "laser for kidney stones"    There were no vitals filed for this visit.      Subjective Assessment - 07/28/16 1419    Subjective "it's killing me"  pain in lower back that began about 6 months-1 year ago, insidious onset. Pain continues to get worse. Sharp pain is always there. takes 3 tylenol every morning. "I am on so many medications, I can't even tell what I am feeling"  reportedly began the medications today.    Patient Stated Goals walking, exercising    Currently in Pain? Yes   Pain Location Back   Pain Orientation Lower   Pain Descriptors / Indicators Sharp   Aggravating Factors  coughing, brushing teeth   Pain Relieving Factors nothing            [facility] PT Assessment - 07/28/16 0001      Assessment   Medical Diagnosis LBP without sciatica   Referring Provider Boykin Nearing, MD   Hand Dominance Right   Prior Therapy no  Precautions   Precautions Other (comment)  defibrillator     Restrictions   Weight Bearing Restrictions No     Balance Screen   Has the patient fallen in the past 6 months No     Prior Function   Level of Independence Independent     Cognition   Overall Cognitive Status Difficult to assess     Sensation   Additional Comments bilateral hands and feet N/T 75% of the time     Posture/Postural Control   Posture Comments R hip elevated with L spinal curve     ROM / Strength   AROM / PROM / Strength --  unable to accurately test due to pain     Palpation   Palpation comment TTP bilateral SIJ                      [facility] Adult PT Treatment/Exercise - 07/28/16 0001      Exercises   Exercises Knee/Hip     Knee/Hip Exercises: Seated   Ball Squeeze with abdominal engagement   Clamshell with TheraBand Green   Other Seated Knee/Hip Exercises seated abdominal engagement     Manual Therapy   Manual Therapy Joint mobilization   Manual therapy comments 3-layer heel lift in L shoe   Joint Mobilization L long axis mobilizations                PT Education - 07/28/16 1608    Education provided Yes   Education Details anatomy of condition, POC, HEP, exercise form/rationale, lift rationale   Person(s) Educated Patient   Methods Demonstration;Explanation;Tactile cues;Verbal cues;Handout   Comprehension Verbalized understanding;Returned demonstration;Verbal cues required;Tactile cues required;Need further instruction                    Plan - 07/28/16 1609    Clinical Impression Statement Pt arrived to PT today complaining of severe LBP that feels like "death"  and is unrelenting beginning about a year ago of insidious onset. pt with notable leg length discrepancy- L shorter than R. Notable improvement in level of pelvis with insert in shoe but pt had difficulty saying if he felt different due to medications. I asked the pt to wear the lift for about 3-5 days and to remove if pain increases. Pt was able to demo HEP with difficulty. Was instructed to contact us with any further questions. Was educated on medicaid benefits.    PT Treatment/Interventions ADLs/Self Care Home Management;Therapeutic exercise;Manual techniques;Patient/family education   Recommended Other Services cardiac rehab   Consulted and Agree with Plan of Care Patient      Patient will benefit from skilled therapeutic intervention in order to improve the following deficits and impairments:  Difficulty walking, Pain, Decreased activity tolerance, Decreased mobility  Visit Diagnosis: Chronic midline low back pain without  sciatica - Plan: PT plan of care cert/re-cert     Problem List Patient Active Problem List   Diagnosis Date Noted  . Great toe pain, right 07/22/2016  . Exertional dyspnea 02/28/2016  . Hypersomnia 02/28/2016  . Abnormal PFT 02/24/2016  . SOB (shortness of breath) 01/28/2016  . Diabetic polyneuropathy associated with type 2 diabetes mellitus ([place]) 12/18/2015  . Lumbar back pain 12/18/2015  . Controlled type 2 diabetes mellitus with complication, with long-term current use of insulin ([place]) 09/17/2015  . 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 Lesion of right median nerve at forearm 04/16/2015  . ICD (implantable cardioverter-defibrillator) in place 03/28/2014  .  Chronic systolic congestive heart failure ([place]) 03/28/2014  . Ventricular tachycardia, non-sustained ([place]) 03/27/2014  . Nonischemic cardiomyopathy ([place]) 11/09/2013  . Chronic systolic dysfunction of left ventricle 11/09/2013  . Syncope 10/20/2013  . Fracture of humeral shaft, left, closed 08/12/2013    Class: Diagnosis of  . Chronic chest pain 07/28/2013  . 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 RENAL CALCULUS 05/13/2009  . Obesity 05/08/2009  . Former heavy cigarette smoker (20-39 per day) 05/08/2009  . HOARSENESS 05/08/2009  . ANXIETY 02/28/2008  . GERD 02/28/2008  . IRRITABLE BOWEL SYNDROME 02/28/2008    Jahquan Klugh C. Kiev Labrosse PT, DPT 07/28/16 4:20 PM   [facility] [address] [place], [place], [address] Phone: [phone]   Fax:  [phone]  Name: ELVER STADLER MRN: [id number] Date of Birth: February 14, 1966

## 2016-07-30 ENCOUNTER — Other Ambulatory Visit: Payer: Self-pay

## 2016-07-30 DIAGNOSIS — I519 Heart disease, unspecified: Secondary | ICD-10-CM

## 2016-07-30 DIAGNOSIS — I428 Other cardiomyopathies: Secondary | ICD-10-CM

## 2016-07-30 MED ORDER — SACUBITRIL-VALSARTAN 24-26 MG PO TABS: 1.0000 | ORAL_TABLET | Freq: Two times a day (BID) | ORAL | 3 refills | Status: DC

## 2016-07-30 NOTE — Telephone Encounter (Signed)
Rx(s) sent to pharmacy electronically.  

## 2016-07-31 ENCOUNTER — Telehealth: Payer: Self-pay | Admitting: *Deleted

## 2016-07-31 ENCOUNTER — Ambulatory Visit (INDEPENDENT_AMBULATORY_CARE_PROVIDER_SITE_OTHER): Payer: Medicaid Other | Admitting: Podiatry

## 2016-07-31 ENCOUNTER — Encounter: Payer: Self-pay | Admitting: Podiatry

## 2016-07-31 DIAGNOSIS — L600 Ingrowing nail: Secondary | ICD-10-CM

## 2016-07-31 NOTE — Telephone Encounter (Signed)
Questions regarding entresto answered and sent for review. FM#[phone].

## 2016-07-31 NOTE — Patient Instructions (Addendum)

## 2016-08-01 ENCOUNTER — Telehealth: Payer: Self-pay | Admitting: *Deleted

## 2016-08-01 ENCOUNTER — Other Ambulatory Visit: Payer: Self-pay | Admitting: Cardiology

## 2016-08-01 ENCOUNTER — Telehealth: Payer: Self-pay | Admitting: Cardiology

## 2016-08-01 NOTE — Telephone Encounter (Signed)
Pt states he had a procedure and was told to soak in epsom salt, but he is a diabetic and the epsom salt box states if diabetic not to use. I informed pt that for a short time the epsom salt would be okay but could switch to betadine 1 T in a qt of warm water and use the same instructions. Pt asked how long it would hurt. I told him redness, drainage and pain should start to decrease each day the further he got from the procedure date. I instructed pt to take ibuprofen OTC if he tolerated but he states he has a heart condition and can not take, I told him to take tylenol if he was able to tolerate. Pt states understanding.

## 2016-08-01 NOTE — Telephone Encounter (Signed)
New message    *STAT* If patient is at the pharmacy, call can be transferred to refill team.   1. Which medications need to be refilled? (please list name of each medication and dose if known) sacubitril-valsartan (ENTRESTO) 24-26 MG  2. Which pharmacy/location (including street and city if local pharmacy) is medication to be sent to? [facility] on [address]  3. Do they need a 30 day or 90 day supply? 90 day supply

## 2016-08-01 NOTE — Telephone Encounter (Signed)
Left message for patient, the PA process has been started. He needs to call back if he needs samples.

## 2016-08-01 NOTE — Progress Notes (Signed)
Subjective:     Patient ID: [name], male   DOB: [birth year], 51 y.o.   MRN: [id number]  HPI patient states my big toenail on my right has been bothering me and subungual my left big toenail and second nail. States this is been going on for several months these tried to trim them and soak them without relief   Review of Systems     Objective:   Physical Exam Neurovascular status intact negative Homans sign was noted with incurvation medial lateral borders hallux right over left and second nail left with pain when palpated    Assessment:     Ingrown toenail deformity right over left hallux medial lateral borders and second digit left with distal redness with no indication of current infection    Plan:     H&P condition reviewed and recommended removal of the nail borders. Explained procedure and risk and today I infiltrated the right hallux 60 mg like Marcaine mixture remove the borders exposed matrix and applied phenol 3 applications 30 seconds followed by alcohol lavage and sterile dressing. Discussed correction of the left and patient will return in 4 weeks for reevaluation

## 2016-08-01 NOTE — Telephone Encounter (Signed)
°  New Prob  Pt states refill was not received for his Entresto medication.    *STAT* If patient is at the pharmacy, call can be transferred to refill team.   1. Which medications need to be refilled? (please list name of each medication and dose if known)  Entresto 24-26 mg  2. Which pharmacy/location (including street and city if local pharmacy) is medication to be sent to? [facility] on [address]  3. Do they need a 30 day or 90 day supply? 30 days or 90 days if possible    PT STATES MEDICATION NEEDS TO BE APPROVED BY MEDICAID PRIOR TO GOING TO THE PHARMACY.

## 2016-08-01 NOTE — Telephone Encounter (Signed)
Medication Detail    Disp Refills Start End   sacubitril-valsartan (ENTRESTO) 24-26 MG 180 tablet 3 07/30/2016    Sig - Route: Take 1 tablet by mouth 2 (two) times daily. - Oral   E-Prescribing Status: Receipt confirmed by pharmacy (07/30/2016 4:45 PM EST)   Associated Diagnoses   Nonischemic cardiomyopathy ([place])     Chronic systolic dysfunction of left ventricle     Pharmacy   [place], [place]

## 2016-08-05 ENCOUNTER — Encounter: Payer: Medicaid Other | Admitting: *Deleted

## 2016-08-05 ENCOUNTER — Other Ambulatory Visit: Payer: Medicaid Other

## 2016-08-05 ENCOUNTER — Ambulatory Visit: Payer: Medicaid Other | Admitting: Pulmonary Disease

## 2016-08-11 NOTE — Progress Notes (Signed)
No ICM remote transmission received for 08/05/2016 and next ICM transmission scheduled for 08/28/2016.

## 2016-08-13 ENCOUNTER — Encounter: Payer: Self-pay | Admitting: Cardiology

## 2016-08-15 ENCOUNTER — Ambulatory Visit (INDEPENDENT_AMBULATORY_CARE_PROVIDER_SITE_OTHER): Payer: Medicaid Other

## 2016-08-15 ENCOUNTER — Telehealth: Payer: Self-pay | Admitting: Cardiology

## 2016-08-15 DIAGNOSIS — I5022 Chronic systolic (congestive) heart failure: Secondary | ICD-10-CM | POA: Diagnosis not present

## 2016-08-15 DIAGNOSIS — Z95810 Presence of automatic (implantable) cardiac defibrillator: Secondary | ICD-10-CM | POA: Diagnosis not present

## 2016-08-15 NOTE — Telephone Encounter (Signed)
Spoke w/ pt and requested that he send a manual transmission b/c his home monitor has not updated in at least 7 days.   

## 2016-08-15 NOTE — Progress Notes (Signed)
EPIC Encounter for ICM Monitoring  Patient Name: [name] is a 51 y.o. male Date: 08/15/2016 Primary Care Physican: Minerva Ends, MD Primary Cardiologist:[place] Electrophysiologist: Allred Dry Weight:255 lbs      Heart Failure questions reviewed, pt asymptomatic    Thoracic impedance normal   Prescribed and confirmed dosage: Furosemide 80 mg 1 tablet twice a day.  Potassium 20 mEq 1 tablet daily.   Recommendations: No changes. Reminded to limit dietary salt intake to 2000 mg/day and fluid intake to < 2 liters/day. Encouraged to call for fluid symptoms.  Follow-up plan: ICM clinic phone appointment on 09/22/2016.  Copy of ICM check sent to device physician.   3 month ICM trend: 08/15/2016   1 Year ICM trend:      [name], RN 08/15/2016 2:24 PM

## 2016-08-16 ENCOUNTER — Other Ambulatory Visit: Payer: Self-pay | Admitting: Cardiology

## 2016-08-29 ENCOUNTER — Ambulatory Visit (INDEPENDENT_AMBULATORY_CARE_PROVIDER_SITE_OTHER): Payer: Medicaid Other | Admitting: Orthopaedic Surgery

## 2016-08-29 ENCOUNTER — Ambulatory Visit (INDEPENDENT_AMBULATORY_CARE_PROVIDER_SITE_OTHER): Payer: Medicaid Other

## 2016-08-29 ENCOUNTER — Encounter (INDEPENDENT_AMBULATORY_CARE_PROVIDER_SITE_OTHER): Payer: Self-pay | Admitting: Orthopaedic Surgery

## 2016-08-29 VITALS — BP 139/82 | HR 87 | Ht 73.0 in | Wt 250.0 lb

## 2016-08-29 DIAGNOSIS — G5690 Unspecified mononeuropathy of unspecified upper limb: Secondary | ICD-10-CM

## 2016-08-29 DIAGNOSIS — M5442 Lumbago with sciatica, left side: Secondary | ICD-10-CM

## 2016-08-29 DIAGNOSIS — M5441 Lumbago with sciatica, right side: Secondary | ICD-10-CM

## 2016-08-29 DIAGNOSIS — M461 Sacroiliitis, not elsewhere classified: Secondary | ICD-10-CM | POA: Diagnosis not present

## 2016-08-29 NOTE — Patient Instructions (Signed)
Please pay close attention to how you feel the first few hours after your injections.

## 2016-08-29 NOTE — Progress Notes (Signed)
Office Visit Note   Patient: [name]           Date of Birth: 1966/03/16           MRN: [id number] Visit Date: 08/29/2016              Requested by: Boykin Nearing, MD [address] [place],  [address] PCP: Minerva Ends, MD   Assessment & Plan: Visit Diagnoses:  1. Midline low back pain with bilateral sciatica, unspecified chronicity   2. SI (sacroiliac) joint inflammation (HCC)   3. Neuropathy of hand, unspecified laterality     Plan: The try to help give patient some relief of his low back pain I will send him for diagnostic/therapeutic bilateral SI joint injections. Advised patient to pay close attention at high feels the first several hours after the injections. Follow Dr. Lorin Mercy about a week after he has had these to check his response. Advised him that the intermittent cramping that he has noted left hand may be possibly due to some nerve irritation after he's had ORIF of his mid shaft humerus fracture in 2015 by Dr. Sharol Given. Overall I think that his function is very good. Recommend holding off on any further diagnostic studies at this point. We may consider NCV/EMG studies of both upper extremities since he does have a documented history of bilateral median neuropathies states that he's never had nerve studies.  Follow-Up Instructions: Return in about 3 weeks (around 09/19/2016) for Follow-up with Dr. Lorin Mercy after SI joint injections.   Orders:  Orders Placed This Encounter  Procedures  . XR Lumbar Spine 2-3 Views  . Ambulatory referral to Physical Medicine Rehab   No orders of the defined types were placed in this encounter.     Procedures: No procedures performed   Clinical Data: No additional findings.   Subjective: Chief Complaint  Patient presents with  . Lower Back - Pain  . Left Hand - Pain    Patient presents today for low back pain for the last 6-12 months. Denies this being form an injury.  States he does have bilateral leg pain with  numbness and tingling in both legs.     Review of Systems  Constitutional: Negative.   HENT: Negative.   Cardiovascular: Negative.   Genitourinary: Negative.   Musculoskeletal: Positive for back pain.  Skin: Negative.   Neurological: Negative for weakness and numbness.  Psychiatric/Behavioral: Negative.      Objective: Vital Signs: BP 139/82   Pulse 87   Ht 6\' 1"  (1.854 m)   Wt 250 lb (113.4 kg)   BMI 32.98 kg/m   Physical Exam  Constitutional: He is oriented to person, place, and time. He appears well-developed and well-nourished. No distress.  HENT:  Head: Normocephalic and atraumatic.  Eyes: EOM are normal. Pupils are equal, round, and reactive to light.  Neck: Normal range of motion.  Pulmonary/Chest: No respiratory distress.  Abdominal: He exhibits no distension.  Musculoskeletal:  Gait is normal. No lumbar paraspinal tenderness. Bilateral sciatic notch minimally tender. He has moderate to marked tenderness over the bilateral SI joints. Negative logroll bilateral hips. Negative straight leg raise. Positive bilateral FABER test.  Neurovascular intact. No focal motor deficits. Left upper extremity does have a large surgical scar from previous ORIF humerus. This area is nontender. Good shoulder range of motion. Left elbow unremarkable. Negative Tinel's over the cubital tunnel. Left wrist good range of motion. Negative Tinel's and Phalen's. Good grip strength. No Wrist or  finger extensor weakness.  Right wrist good range of motion. Positive Tinel's.  Neurological: He is alert and oriented to person, place, and time.  Skin: Skin is warm and dry.  Psychiatric: He has a normal mood and affect.    Ortho Exam  Specialty Comments:  No specialty comments available.  Imaging: No results found.   PMFS History: Patient Active Problem List   Diagnosis Date Noted  . Great toe pain, right 07/22/2016  . Exertional dyspnea 02/28/2016  . Hypersomnia 02/28/2016  . Abnormal PFT  02/24/2016  . SOB (shortness of breath) 01/28/2016  . Diabetic polyneuropathy associated with type 2 diabetes mellitus ([place]) 12/18/2015  . Lumbar back pain 12/18/2015  . Controlled type 2 diabetes mellitus with complication, with long-term current use of insulin ([place]) 09/17/2015  . 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 Lesion of right median nerve at forearm 04/16/2015  . ICD (implantable cardioverter-defibrillator) in place 03/28/2014  . Chronic systolic congestive heart failure ([place]) 03/28/2014  . Ventricular tachycardia, non-sustained ([place]) 03/27/2014  . Nonischemic cardiomyopathy ([place]) 11/09/2013  . Chronic systolic dysfunction of left ventricle 11/09/2013  . Syncope 10/20/2013  . Fracture of humeral shaft, left, closed 08/12/2013    Class: Diagnosis of  . Chronic chest pain 07/28/2013  . 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 RENAL CALCULUS 05/13/2009  . Obesity 05/08/2009  . Former heavy cigarette smoker (20-39 per day) 05/08/2009  . HOARSENESS 05/08/2009  . ANXIETY 02/28/2008  . GERD 02/28/2008  . IRRITABLE BOWEL SYNDROME 02/28/2008   Past Medical History:  Diagnosis Date  . Cardiomyopathy ([place])    a. 07/2013 Echo: EF 20-25%, diff HK, Gr2 DD, mild MR, mod dil LA/RA.  Marland Kitchen CHF (congestive heart failure) ([place])   . Chronic bronchitis ([place])    "get it most q yr" (12/23/2013)  . Chronic systolic dysfunction of left ventricle   . Fracture of left humerus    a. 07/2013.  Marland Kitchen GERD (gastroesophageal reflux disease)    not at present time  . Heart murmur    "born w/it"   . History of renal calculi   . Hypertension   . Kidney stones   . Obesity   . 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 Syncope   . Type II diabetes mellitus ([place]) 2006    Family History  Problem Relation Age of Onset  . Diabetes Father   . Arthritis Father   . Diabetes  Mother   . Arthritis Mother   . Hypertension Brother     Past Surgical History:  Procedure Laterality Date  . CARDIAC CATHETERIZATION  08/10/13  . CHOLECYSTECTOMY  ~ 2010  . IMPLANTABLE CARDIOVERTER DEFIBRILLATOR IMPLANT  12/23/2013   STJ Fortify ICD implanted by [name] for cardiomyopathy and syncope  . IMPLANTABLE CARDIOVERTER DEFIBRILLATOR IMPLANT N/A 12/23/2013   Procedure: IMPLANTABLE CARDIOVERTER DEFIBRILLATOR IMPLANT;  Surgeon: Coralyn Mark, MD;  Location: [facility] CATH LAB;  Service: Cardiovascular;  Laterality: N/A;  . LEFT HEART CATHETERIZATION WITH CORONARY ANGIOGRAM N/A 08/10/2013   Procedure: LEFT HEART CATHETERIZATION WITH CORONARY ANGIOGRAM;  Surgeon: Jettie Booze, MD;  [facility];  Service: Cardiovascular;  Laterality: N/A;  . ORIF HUMERUS FRACTURE Left 08/12/2013   Procedure: OPEN REDUCTION INTERNAL FIXATION (ORIF) HUMERAL SHAFT FRACTURE;  Surgeon: Newt Minion, MD;  Location: [facility];  Service: Orthopedics;  Laterality: Left;  Open Reduction Internal Fixation Left  Humerus  . URETEROSCOPY     "laser for kidney stones"   Social History   Occupational History  . Not on file.   Social History Main Topics  . Smoking status: Former Smoker    Packs/day: 1.00    Years: 28.00    Types: Cigarettes    Quit date: 06/03/2014  . Smokeless tobacco: Never Used  . Alcohol use 0.0 oz/week     Comment: 12/23/2013 "might drink a couple times/yr"  . Drug use: Yes    Types: Marijuana  . Sexual activity: Yes

## 2016-09-04 ENCOUNTER — Ambulatory Visit (INDEPENDENT_AMBULATORY_CARE_PROVIDER_SITE_OTHER): Payer: Medicaid Other | Admitting: Podiatry

## 2016-09-04 ENCOUNTER — Encounter: Payer: Self-pay | Admitting: Podiatry

## 2016-09-04 DIAGNOSIS — L600 Ingrowing nail: Secondary | ICD-10-CM | POA: Diagnosis not present

## 2016-09-05 NOTE — Progress Notes (Signed)
Subjective:     Patient ID: [name], male   DOB: Feb 03, 1966, 51 y.o.   MRN: [id number]  HPI patient presents stating the nail on the right seems okay but it did hurt me for a while and I'm a hold off getting the left once fixed   Review of Systems     Objective:   Physical Exam Neurovascular was found to be intact with patient found to have well-healed surgical site right hallux with incurvated left hallux and second digit medial and lateral borders    Assessment:     Improve nail condition right with chronic ingrown toenail of to nails on the left    Plan:     H&P conditions reviewed and recommended long-term nail surgery left but he wants to hold off today and today I educated him on procedure and the risk and he will scheduled to have this done

## 2016-09-11 ENCOUNTER — Ambulatory Visit (INDEPENDENT_AMBULATORY_CARE_PROVIDER_SITE_OTHER): Payer: Medicaid Other | Admitting: Physical Medicine and Rehabilitation

## 2016-09-11 ENCOUNTER — Ambulatory Visit (INDEPENDENT_AMBULATORY_CARE_PROVIDER_SITE_OTHER): Payer: Self-pay

## 2016-09-11 ENCOUNTER — Encounter (INDEPENDENT_AMBULATORY_CARE_PROVIDER_SITE_OTHER): Payer: Self-pay | Admitting: Physical Medicine and Rehabilitation

## 2016-09-11 VITALS — BP 130/74 | HR 82

## 2016-09-11 DIAGNOSIS — M461 Sacroiliitis, not elsewhere classified: Secondary | ICD-10-CM

## 2016-09-11 NOTE — Patient Instructions (Signed)

## 2016-09-11 NOTE — Progress Notes (Signed)
[name] - 51 y.o. male MRN [id number]  Date of birth: [birth year]  Office Visit Note: Visit Date: 09/11/2016 PCP: Minerva Ends, MD Referred by: Boykin Nearing, MD  Subjective: Chief Complaint  Patient presents with  . Lower Back - Pain   HPI: [name] is a 51 year old gentleman with chronic worsening severe pain across lower back for around 6 months to a year. Right=left. Radiates down legs at times and worse on right side. Constant pain. Feels it worse while he is brushing his teeth. He points to the PSIS area over the sacroiliac joints. He essentially has a positive Fortin finger sign. On exam he has a positive Patrick's test.    ROS Otherwise per HPI.  Assessment & Plan: Visit Diagnoses:  1. Sacroiliitis ([place])     Plan: Findings:  Bilateral sacroiliac joint injections with fluoroscopic guidance.    Meds & Orders: No orders of the defined types were placed in this encounter.   Orders Placed This Encounter  Procedures  . Large Joint Injection/Arthrocentesis  . Large Joint Injection/Arthrocentesis  . XR C-ARM NO REPORT    Follow-up: Return for Dr. Lorin Mercy as scheduled.   Procedures: Sacroiliac joint injeciton Date/Time: 09/11/2016 3:54 PM Performed by: Magnus Sinning Authorized by: Magnus Sinning   Consent Given by:  Patient Site marked: the procedure site was marked   Timeout: prior to procedure the correct patient, procedure, and site was verified   Indications:  Pain and diagnostic evaluation Location:  Sacroiliac Site:  L sacroiliac joint Prep: patient was prepped and draped in usual sterile fashion   Needle Size:  22 G Needle Length:  3.5 inches Approach:  Posterior Ultrasound Guidance: No   Fluoroscopic Guidance: Yes   Arthrogram: No   Medications:  80 mg methylPREDNISolone acetate 80 MG/ML; 2 mL bupivacaine 0.5 % Aspiration Attempted: No   Patient tolerance:  Patient tolerated the procedure well with no immediate complications  There  was excellent flow of contrast producing a partial arthrogram of the sacroiliac joint.  Large Joint Inj Date/Time: 09/11/2016 3:54 PM Performed by: Magnus Sinning Authorized by: Magnus Sinning   Consent Given by:  Patient Site marked: the procedure site was marked   Timeout: prior to procedure the correct patient, procedure, and site was verified   Indications:  Pain and diagnostic evaluation Location:  Sacroiliac Site:  R sacroiliac joint Prep: patient was prepped and draped in usual sterile fashion   Needle Size:  22 G Needle Length:  3.5 inches Approach:  Posterior Ultrasound Guidance: No   Fluoroscopic Guidance: Yes   Arthrogram: No   Medications:  80 mg methylPREDNISolone acetate 80 MG/ML; 2 mL bupivacaine 0.5 % Aspiration Attempted: No   Patient tolerance:  Patient tolerated the procedure well with no immediate complications  There was excellent flow of contrast producing a partial arthrogram of the sacroiliac joint.     No notes on file   Clinical History: No specialty comments available.  He reports that he quit smoking about 2 years ago. His smoking use included Cigarettes. He has a 28.00 pack-year smoking history. He has never used smokeless tobacco.   Recent Labs  12/17/15 1605 07/21/16 1448  HGBA1C 7.7 7.6    Objective:  VS:  HT:    WT:   BMI:     BP:130/74  HR:82bpm  TEMP: ( )  RESP:  Physical Exam  Musculoskeletal:  Patient has pain with extension of the lumbar spine he has positive Patrick's test bilaterally with external  rotation. He has no groin pain on internal rotation is good distal strength.    Ortho Exam Imaging: No results found.  Past Medical/Family/Surgical/Social History: Medications & Allergies reviewed per EMR Patient Active Problem List   Diagnosis Date Noted  . Great toe pain, right 07/22/2016  . Exertional dyspnea 02/28/2016  . Hypersomnia 02/28/2016  . Abnormal PFT 02/24/2016  . SOB (shortness of breath) 01/28/2016  .  Diabetic polyneuropathy associated with type 2 diabetes mellitus ([place]) 12/18/2015  . Lumbar back pain 12/18/2015  . Controlled type 2 diabetes mellitus with complication, with long-term current use of insulin ([place]) 09/17/2015  . 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 Lesion of right median nerve at forearm 04/16/2015  . ICD (implantable cardioverter-defibrillator) in place 03/28/2014  . Chronic systolic congestive heart failure ([place]) 03/28/2014  . Ventricular tachycardia, non-sustained ([name]) 03/27/2014  . Nonischemic cardiomyopathy ([place]) 11/09/2013  . Chronic systolic dysfunction of left ventricle 11/09/2013  . Syncope 10/20/2013  . Fracture of humeral shaft, left, closed 08/12/2013    Class: Diagnosis of  . Chronic chest pain 07/28/2013  . 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 RENAL CALCULUS 05/13/2009  . Obesity 05/08/2009  . Former heavy cigarette smoker (20-39 per day) 05/08/2009  . HOARSENESS 05/08/2009  . ANXIETY 02/28/2008  . GERD 02/28/2008  . IRRITABLE BOWEL SYNDROME 02/28/2008   Past Medical History:  Diagnosis Date  . Cardiomyopathy ([place])    a. 07/2013 Echo: EF 20-25%, diff HK, Gr2 DD, mild MR, mod dil LA/RA.  Marland Kitchen CHF (congestive heart failure) ([place])   . Chronic bronchitis ([place])    "get it most q yr" (12/23/2013)  . Chronic systolic dysfunction of left ventricle   . Fracture of left humerus    a. 07/2013.  Marland Kitchen GERD (gastroesophageal reflux disease)    not at present time  . Heart murmur    "born w/it"   . History of renal calculi   . Hypertension   . Kidney stones   . Obesity   . 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 Syncope   . Type II diabetes mellitus ([place]) 2006   Family History  Problem Relation Age of Onset  . Diabetes Father   . Arthritis Father   . Diabetes Mother   . Arthritis Mother   . Hypertension Brother     Past Surgical History:  Procedure Laterality Date  . CARDIAC CATHETERIZATION  08/10/13  . CHOLECYSTECTOMY  ~ 2010  . IMPLANTABLE CARDIOVERTER DEFIBRILLATOR IMPLANT  12/23/2013   STJ Fortify ICD implanted by [name] for cardiomyopathy and syncope  . IMPLANTABLE CARDIOVERTER DEFIBRILLATOR IMPLANT N/A 12/23/2013   Procedure: IMPLANTABLE CARDIOVERTER DEFIBRILLATOR IMPLANT;  Surgeon: Coralyn Mark, MD;  Location: [facility] CATH LAB;  Service: Cardiovascular;  Laterality: N/A;  . LEFT HEART CATHETERIZATION WITH CORONARY ANGIOGRAM N/A 08/10/2013   Procedure: LEFT HEART CATHETERIZATION WITH CORONARY ANGIOGRAM;  Surgeon: Jettie Booze, MD;  Location: [facility] CATH LAB;  Service: Cardiovascular;  Laterality: N/A;  . ORIF HUMERUS FRACTURE Left 08/12/2013   Procedure: OPEN REDUCTION INTERNAL FIXATION (ORIF) HUMERAL SHAFT FRACTURE;  Surgeon: Newt Minion, MD;  Location: [facility];  Service: Orthopedics;  Laterality: Left;  Open Reduction Internal Fixation Left Humerus  . URETEROSCOPY     "laser for kidney stones"   Social History   Occupational History  . Not on file.   Social History Main Topics  .  Smoking status: Former Smoker    Packs/day: 1.00    Years: 28.00    Types: Cigarettes    Quit date: 06/03/2014  . Smokeless tobacco: Never Used  . Alcohol use 0.0 oz/week     Comment: 12/23/2013 "might drink a couple times/yr"  . Drug use: Yes    Types: Marijuana  . Sexual activity: Yes

## 2016-09-15 MED ORDER — METHYLPREDNISOLONE ACETATE 80 MG/ML IJ SUSP
80.0000 mg | INTRAMUSCULAR | Status: AC | PRN
  Administered 2016-09-11: 80 mg via INTRA_ARTICULAR

## 2016-09-15 MED ORDER — BUPIVACAINE HCL 0.5 % IJ SOLN
2.0000 mL | INTRAMUSCULAR | Status: AC | PRN
  Administered 2016-09-11: 2 mL via INTRA_ARTICULAR

## 2016-09-22 ENCOUNTER — Telehealth: Payer: Self-pay | Admitting: Cardiology

## 2016-09-22 NOTE — Telephone Encounter (Signed)
LMOVM reminding pt to send remote transmission.   

## 2016-09-28 ENCOUNTER — Other Ambulatory Visit: Payer: Self-pay | Admitting: Family Medicine

## 2016-09-28 DIAGNOSIS — G8929 Other chronic pain: Secondary | ICD-10-CM

## 2016-09-28 DIAGNOSIS — E118 Type 2 diabetes mellitus with unspecified complications: Secondary | ICD-10-CM

## 2016-09-28 DIAGNOSIS — Z794 Long term (current) use of insulin: Secondary | ICD-10-CM

## 2016-09-28 DIAGNOSIS — M545 Low back pain: Principal | ICD-10-CM

## 2016-09-30 NOTE — Progress Notes (Signed)
No ICM remote transmission received for 09/22/2016 and next ICM transmission scheduled for 10/13/2016.

## 2016-10-03 ENCOUNTER — Encounter (INDEPENDENT_AMBULATORY_CARE_PROVIDER_SITE_OTHER): Payer: Self-pay | Admitting: Orthopaedic Surgery

## 2016-10-03 ENCOUNTER — Ambulatory Visit (INDEPENDENT_AMBULATORY_CARE_PROVIDER_SITE_OTHER): Payer: Medicaid Other | Admitting: Orthopaedic Surgery

## 2016-10-03 VITALS — BP 124/79 | HR 83

## 2016-10-03 DIAGNOSIS — M5416 Radiculopathy, lumbar region: Secondary | ICD-10-CM | POA: Diagnosis not present

## 2016-10-03 DIAGNOSIS — G5692 Unspecified mononeuropathy of left upper limb: Secondary | ICD-10-CM | POA: Diagnosis not present

## 2016-10-03 NOTE — Progress Notes (Signed)
Office Visit Note   Patient: [name]           Date of Birth: 28-Jan-1966           MRN: [id number] Visit Date: 10/03/2016              Requested by: Boykin Nearing, MD [address] [place], [place] [address] PCP: Minerva Ends, MD   Assessment & Plan: Visit Diagnoses:  1. Radiculopathy, lumbar region   2. Neuropathy of upper extremity, left   Question radial nerve palsy.  Plan: With patient's ongoing lumbar radicular symptoms we will schedule CT with/without contrast for further evaluation.  We'll follow up after study to discuss results and further treatment options. With his ongoing left upper extremity nerve irritation still may consider ordering NCV/EMG study has worsening problem. He would like to hold off on scheduling at this time.  Follow-Up Instructions: Return in about 3 weeks (around 10/24/2016).   Orders:  Orders Placed This Encounter  Procedures  . CT LUMBAR SPINE W WO CONTRAST   No orders of the defined types were placed in this encounter.     Procedures: No procedures performed   Clinical Data: No additional findings.   Subjective: Chief Complaint  Patient presents with  . Lower Back - Follow-up, Pain    HPI Patient returns after having bilateral SI joint injections. States that he had about 40% improvement of his pain. Continues of ongoing pain in low back that radiates down both legs. He has gone through formal PT without any improvement of the symptoms. Currently using Tylenol for pain. Pain aggravated with all activity. Also continues to describe cramping of the left hand intermittent. There is a documented she has had previous ORIF left humerus Dr. Sharol Given several years ago.   Review of Systems  Constitutional: Negative.   Respiratory: Negative.   Cardiovascular: Negative.   Genitourinary: Negative.   Musculoskeletal: Positive for back pain.  Neurological: Positive for numbness. Negative for weakness.     Objective: Vital  Signs: BP 124/79   Pulse 83   Physical Exam  Constitutional: He is oriented to person, place, and time. No distress.  HENT:  Head: Normocephalic.  Eyes: EOM are normal. Pupils are equal, round, and reactive to light.  Pulmonary/Chest: No respiratory distress.  Musculoskeletal:  Gait is normal. He does have low back discomfort with lumbar extension. Bilateral lumbar paraspinal tenderness. Bilateral SI joint tenderness. Sciatic notch tenderness. Negative logroll bilateral hips. Straight leg raise he has tight hamstrings bilaterally. Bilateral calves are nontender. No focal motor deficits.  Neurological: He is alert and oriented to person, place, and time.    Ortho Exam  Specialty Comments:  No specialty comments available.  Imaging: No results found.   PMFS History: Patient Active Problem List   Diagnosis Date Noted  . Great toe pain, right 07/22/2016  . Exertional dyspnea 02/28/2016  . Hypersomnia 02/28/2016  . Abnormal PFT 02/24/2016  . SOB (shortness of breath) 01/28/2016  . Diabetic polyneuropathy associated with type 2 diabetes mellitus ([place]) 12/18/2015  . Lumbar back pain 12/18/2015  . Controlled type 2 diabetes mellitus with complication, with long-term current use of insulin ([place]) 09/17/2015  . 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 Lesion of right median nerve at forearm 04/16/2015  . ICD (implantable cardioverter-defibrillator) in place 03/28/2014  . Chronic systolic congestive heart failure ([place]) 03/28/2014  . Ventricular tachycardia, non-sustained ([place]) 03/27/2014  . Nonischemic cardiomyopathy ([place]) 11/09/2013  . Chronic systolic dysfunction of  left ventricle 11/09/2013  . Syncope 10/20/2013  . Fracture of humeral shaft, left, closed 08/12/2013    Class: Diagnosis of  . Chronic chest pain 07/28/2013  . 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 RENAL CALCULUS 05/13/2009  . Obesity 05/08/2009  . Former heavy cigarette smoker (20-39 per day)  05/08/2009  . HOARSENESS 05/08/2009  . ANXIETY 02/28/2008  . GERD 02/28/2008  . IRRITABLE BOWEL SYNDROME 02/28/2008   Past Medical History:  Diagnosis Date  . Cardiomyopathy ([place])    a. 07/2013 Echo: EF 20-25%, diff HK, Gr2 DD, mild MR, mod dil LA/RA.  Marland Kitchen CHF (congestive heart failure) ([place])   . Chronic bronchitis ([place])    "get it most q yr" (12/23/2013)  . Chronic systolic dysfunction of left ventricle   . Fracture of left humerus    a. 07/2013.  Marland Kitchen GERD (gastroesophageal reflux disease)    not at present time  . Heart murmur    "born w/it"   . History of renal calculi   . Hypertension   . Kidney stones   . Obesity   . 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 Syncope   . Type II diabetes mellitus ([place]) 2006    Family History  Problem Relation Age of Onset  . Diabetes Father   . Arthritis Father   . Diabetes Mother   . Arthritis Mother   . Hypertension Brother     Past Surgical History:  Procedure Laterality Date  . CARDIAC CATHETERIZATION  08/10/13  . CHOLECYSTECTOMY  ~ 2010  . IMPLANTABLE CARDIOVERTER DEFIBRILLATOR IMPLANT  12/23/2013   STJ Fortify ICD implanted by [name] for cardiomyopathy and syncope  . IMPLANTABLE CARDIOVERTER DEFIBRILLATOR IMPLANT N/A 12/23/2013   Procedure: IMPLANTABLE CARDIOVERTER DEFIBRILLATOR IMPLANT;  Surgeon: Coralyn Mark, MD;  Location: [facility] CATH LAB;  Service: Cardiovascular;  Laterality: N/A;  . LEFT HEART CATHETERIZATION WITH CORONARY ANGIOGRAM N/A 08/10/2013   Procedure: LEFT HEART CATHETERIZATION WITH CORONARY ANGIOGRAM;  Surgeon: Jettie Booze, MD;  Location: [facility] CATH LAB;  Service: Cardiovascular;  Laterality: N/A;  . ORIF HUMERUS FRACTURE Left 08/12/2013   Procedure: OPEN REDUCTION INTERNAL FIXATION (ORIF) HUMERAL SHAFT FRACTURE;  Surgeon: Newt Minion, MD;  Location: [facility];  Service: Orthopedics;  Laterality: Left;   Open Reduction Internal Fixation Left Humerus  . URETEROSCOPY     "laser for kidney stones"   Social History   Occupational History  . Not on file.   Social History Main Topics  . Smoking status: Former Smoker    Packs/day: 1.00    Years: 28.00    Types: Cigarettes    Quit date: 06/03/2014  . Smokeless tobacco: Never Used  . Alcohol use 0.0 oz/week     Comment: 12/23/2013 "might drink a couple times/yr"  . Drug use: Yes    Types: Marijuana  . Sexual activity: Yes

## 2016-10-07 NOTE — Progress Notes (Signed)
HPI: FU CM/CHF. Echo 2/15 revealed EF of 20-25% with diffuse hypokinesis and biatrial enlargement, mild MR and a small pericardial effusion. TSH in February 2015 normal. Cardiac catheterization in March of 2015 showed normal coronary arteries. Echocardiogram August 2017 showed moderate left ventricular hypertrophy, moderate left ventricular enlargement, ejection fraction 24%, grade 1 diastolic dysfunction and moderate left atrial enlargement. CPX August 2017 showed submaximal effort with moderate functional impairment primarily circulatory limited. Since last seen, patient has mild dyspnea on exertion but no orthopnea, PND, pedal edema, syncope. He occasionally has vague chest pain that is unchanged in severity or frequency.  Current Outpatient Prescriptions  Medication Sig Dispense Refill  . ACCU-CHEK AVIVA PLUS test strip USE ONE STRIP THREE TIMES DAILY 100 each 12  . acetaminophen-codeine (TYLENOL #3) 300-30 MG tablet Take 1 tablet by mouth every 8 (eight) hours as needed for moderate pain. 90 tablet 0  . aspirin 81 MG tablet Take 81 mg by mouth daily.    . Blood Glucose Monitoring Suppl (ACCU-CHEK AVIVA PLUS) W/DEVICE KIT 1 Device by Does not apply route 3 (three) times daily after meals. 1 kit 0  . carvedilol (COREG) 25 MG tablet TAKE ONE TABLET BY MOUTH TWICE DAILY 180 tablet 3  . cyclobenzaprine (FLEXERIL) 5 MG tablet TAKE 1 TABLET BY MOUTH THREE TIMES DAILY AS NEEDED FOR  MUSCLE  SPASMS 60 tablet 1  . Elastic Bandages & Supports (LUMBAR BACK BRACE/SUPPORT PAD) MISC 1 each by Does not apply route daily. ICD 10 M54.5 1 each 0  . furosemide (LASIX) 80 MG tablet TAKE 1 1/2 TABLET BY MOUTH TWICE A DAY X'S 3 DAYS THEN TAKE 1 TABLET BY MOUTH TWICE A DAY 189 tablet 1  . gabapentin (NEURONTIN) 300 MG capsule 300 mg in the morning, 300 mg in afternoon and 600 mg in evening 120 capsule 3  . gabapentin (NEURONTIN) 300 MG capsule TAKE ONE CAPSULE BY MOUTH THREE TIMES DAILY 90 capsule 5  . JANUVIA  100 MG tablet TAKE ONE TABLET BY MOUTH ONCE DAILY 90 tablet 3  . KLOR-CON M20 20 MEQ tablet TAKE ONE TABLET BY MOUTH ONCE DAILY 90 tablet 3  . Lancet Devices (ACCU-CHEK SOFTCLIX) lancets 1 each by Other route 3 (three) times daily. 1 each 0  . levalbuterol (XOPENEX HFA) 45 MCG/ACT inhaler Inhale 1-2 puffs into the lungs every 6 (six) hours as needed for wheezing. 1 Inhaler 12  . metFORMIN (GLUCOPHAGE) 1000 MG tablet TAKE 1 TABLETS BY MOUTH IN THE MORNING THEN 1 AND 1/2 TABLET BY MOUTH IN THE EVENING 75 tablet 2  . pantoprazole (PROTONIX) 40 MG tablet TAKE 1 TABLET BY MOUTH ONCE DAILY 90 tablet 0  . sacubitril-valsartan (ENTRESTO) 24-26 MG Take 1 tablet by mouth 2 (two) times daily. 180 tablet 3  . triamcinolone cream (KENALOG) 0.1 % Apply 1 application topically 2 (two) times daily. 30 g 0   No current facility-administered medications for this visit.      Past Medical History:  Diagnosis Date  . Cardiomyopathy ([place])    a. 07/2013 Echo: EF 20-25%, diff HK, Gr2 DD, mild MR, mod dil LA/RA.  Marland Kitchen CHF (congestive heart failure) ([place])   . Chronic bronchitis ([place])    "get it most q yr" (12/23/2013)  . Chronic systolic dysfunction of left ventricle   . Fracture of left humerus    a. 07/2013.  Marland Kitchen GERD (gastroesophageal reflux disease)    not at present time  . Heart murmur    "  born w/it"   . History of renal calculi   . Hypertension   . Kidney stones   . Obesity   . 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 Syncope   . Type II diabetes mellitus ([place]) 2006    Past Surgical History:  Procedure Laterality Date  . CARDIAC CATHETERIZATION  08/10/13  . CHOLECYSTECTOMY  ~ 2010  . IMPLANTABLE CARDIOVERTER DEFIBRILLATOR IMPLANT  12/23/2013   STJ Fortify ICD implanted by [name] for cardiomyopathy and syncope  . IMPLANTABLE CARDIOVERTER DEFIBRILLATOR IMPLANT N/A 12/23/2013    Procedure: IMPLANTABLE CARDIOVERTER DEFIBRILLATOR IMPLANT;  Surgeon: Coralyn Mark, MD;  Location: [facility] CATH LAB;  Service: Cardiovascular;  Laterality: N/A;  . LEFT HEART CATHETERIZATION WITH CORONARY ANGIOGRAM N/A 08/10/2013   Procedure: LEFT HEART CATHETERIZATION WITH CORONARY ANGIOGRAM;  Surgeon: Jettie Booze, MD;  [facility] C[facility] LAB;  Service: Cardiovascular;  Laterality: N/A;  . ORIF HUMERUS FRACTURE Left 08/12/2013   Procedure: OPEN REDUCTION INTERNAL FIXATION (ORIF) HUMERAL SHAFT FRACTURE;  Surgeon: Newt Minion, MD;  Location: [facility];  Service: Orthopedics;  Laterality: Left;  Open Reduction Internal Fixation Left Humerus  . URETEROSCOPY     "laser for kidney stones"    Social History   Social History  . Marital status: Single    Spouse name: N/A  . Number of children: N/A  . Years of education: N/A   Occupational History  . Not on file.   Social History Main Topics  . Smoking status: Former Smoker    Packs/day: 1.00    Years: 28.00    Types: Cigarettes    Quit date: 06/03/2014  . Smokeless tobacco: Never Used  . Alcohol use 0.0 oz/week     Comment: 12/23/2013 "might drink a couple times/yr"  . Drug use: Yes    Types: Marijuana  . Sexual activity: Yes   Other Topics Concern  . Not on file   Social History Narrative  . No narrative on file    Family History  Problem Relation Age of Onset  . Diabetes Father   . Arthritis Father   . Diabetes Mother   . Arthritis Mother   . Hypertension Brother     ROS: no fevers or chills, productive cough, hemoptysis, dysphasia, odynophagia, melena, hematochezia, dysuria, hematuria, rash, seizure activity, orthopnea, PND, pedal edema, claudication. Remaining systems are negative.  Physical Exam: Well-developed well-nourished in no acute distress.  Skin is warm and dry.  HEENT is normal.  Neck is supple. No bruit Chest is clear to auscultation with normal expansion. No wheeze. Cardiovascular exam is regular rate and  rhythm. No murmur Abdominal exam nontender or distended. No masses palpated. Extremities show no edema. neuro grossly intact  ECG- sinus rhythm at a rate of 82.nonspecific ST changes.personally reviewed  A/P  1 chronic systolic congestive heart failure-patient is euvolemic on examination. Continue present dose of Lasix. Check potassium and renal function. We discussed the importance of fluid restriction, low sodium diet. I have asked him to weigh daily and take an additional 40 mg of Lasix for weight gain of 2-3 pounds.  2 nonischemic cardiomyopathy-continue carvedilol and entresto.   3 chronic chest pain-patient continues to have occasional atypical chest pain. Previous catheterization showed no coronary disease.  4 prior ICD-followed by electrophysiology.  5 chronic stage III kidney disease-check potassium and renal function.  [name], MD

## 2016-10-13 ENCOUNTER — Other Ambulatory Visit: Payer: Self-pay | Admitting: Family Medicine

## 2016-10-13 ENCOUNTER — Telehealth: Payer: Self-pay | Admitting: Cardiology

## 2016-10-13 DIAGNOSIS — E118 Type 2 diabetes mellitus with unspecified complications: Secondary | ICD-10-CM

## 2016-10-13 DIAGNOSIS — Z794 Long term (current) use of insulin: Secondary | ICD-10-CM

## 2016-10-13 DIAGNOSIS — K219 Gastro-esophageal reflux disease without esophagitis: Secondary | ICD-10-CM

## 2016-10-13 NOTE — Telephone Encounter (Signed)
Spoke with pt and reminded pt of remote transmission that is due today. Pt verbalized understanding.   

## 2016-10-14 ENCOUNTER — Encounter: Payer: Self-pay | Admitting: Cardiology

## 2016-10-14 ENCOUNTER — Ambulatory Visit (INDEPENDENT_AMBULATORY_CARE_PROVIDER_SITE_OTHER): Payer: Medicaid Other | Admitting: Cardiology

## 2016-10-14 VITALS — BP 112/70 | HR 82 | Ht 73.0 in | Wt 246.0 lb

## 2016-10-14 DIAGNOSIS — I5022 Chronic systolic (congestive) heart failure: Secondary | ICD-10-CM | POA: Diagnosis not present

## 2016-10-14 DIAGNOSIS — I428 Other cardiomyopathies: Secondary | ICD-10-CM

## 2016-10-14 DIAGNOSIS — I10 Essential (primary) hypertension: Secondary | ICD-10-CM

## 2016-10-14 DIAGNOSIS — Z95810 Presence of automatic (implantable) cardiac defibrillator: Secondary | ICD-10-CM | POA: Diagnosis not present

## 2016-10-14 NOTE — Patient Instructions (Signed)
Medication Instructions:   NO CHANGE  Labwork:  Your physician recommends that you return for lab work [place]:  Your physician wants you to follow-up in: [place] will receive a reminder letter in the mail two months in advance. If you don't receive a letter, please call our office to schedule the follow-up appointment.   If you need a refill on your cardiac medications before your next appointment, please call your pharmacy.

## 2016-10-17 ENCOUNTER — Other Ambulatory Visit: Payer: Medicaid Other

## 2016-10-21 NOTE — Progress Notes (Signed)
No ICM remote transmission received for 10/13/2016 and next ICM transmission scheduled for 11/05/2016.

## 2016-10-22 ENCOUNTER — Encounter: Payer: Self-pay | Admitting: Family Medicine

## 2016-10-24 ENCOUNTER — Other Ambulatory Visit: Payer: Medicaid Other

## 2016-10-24 ENCOUNTER — Ambulatory Visit
Admission: RE | Admit: 2016-10-24 | Discharge: 2016-10-24 | Disposition: A | Discharge status: Home / Self Care | Payer: Medicaid Other | Source: Ambulatory Visit | Attending: Surgery | Admitting: Surgery

## 2016-10-24 DIAGNOSIS — M5416 Radiculopathy, lumbar region: Secondary | ICD-10-CM

## 2016-11-04 ENCOUNTER — Telehealth (INDEPENDENT_AMBULATORY_CARE_PROVIDER_SITE_OTHER): Payer: Self-pay | Admitting: Radiology

## 2016-11-04 ENCOUNTER — Encounter (INDEPENDENT_AMBULATORY_CARE_PROVIDER_SITE_OTHER): Payer: Self-pay | Admitting: Orthopaedic Surgery

## 2016-11-04 ENCOUNTER — Ambulatory Visit (INDEPENDENT_AMBULATORY_CARE_PROVIDER_SITE_OTHER): Payer: Medicaid Other | Admitting: Orthopaedic Surgery

## 2016-11-04 ENCOUNTER — Encounter (INDEPENDENT_AMBULATORY_CARE_PROVIDER_SITE_OTHER): Payer: Self-pay

## 2016-11-04 VITALS — BP 107/75 | HR 93 | Ht 73.0 in | Wt 245.0 lb

## 2016-11-04 DIAGNOSIS — Z5329 Procedure and treatment not carried out because of patient's decision for other reasons: Secondary | ICD-10-CM

## 2016-11-04 NOTE — Telephone Encounter (Signed)
Patient came in for appointment today and left without being seen due to wait. He received copy of his CT Lumbar Spine report. Please call to discuss results.  Thanks.  [phone]

## 2016-11-04 NOTE — Telephone Encounter (Signed)
I called him and discussed. Patient states that he was sitting for an hour before his appointment and was having increased pain so he got up and left. He was given a copy of his MRI report he can reschedule appointment at his convenience to go over the MRI discussed the findings with him on telephone and he can return to the review the images with me and discussed options including epidurals. He will call to reschedule.FYI

## 2016-11-04 NOTE — Progress Notes (Deleted)
Patient left without being seen.

## 2016-11-05 ENCOUNTER — Ambulatory Visit (INDEPENDENT_AMBULATORY_CARE_PROVIDER_SITE_OTHER): Payer: Medicaid Other

## 2016-11-05 DIAGNOSIS — Z95810 Presence of automatic (implantable) cardiac defibrillator: Secondary | ICD-10-CM

## 2016-11-05 DIAGNOSIS — I5022 Chronic systolic (congestive) heart failure: Secondary | ICD-10-CM

## 2016-11-05 NOTE — Telephone Encounter (Signed)
Noted.  FYI. This is the patient that you spoke with yesterday.

## 2016-11-06 NOTE — Progress Notes (Signed)
EPIC Encounter for ICM Monitoring  Patient Name: [name] is a 51 y.o. male Date: 11/06/2016 Primary Care Physican: Boykin Nearing, MD Primary Cardiologist:[place] Electrophysiologist: Allred Dry RDEYCX:448 lbs                                                    Heart Failure questions reviewed, pt asymptomatic.   Thoracic impedance normal but was abnormal suggesting fluid accumulation 10/09/2016 to 10/22/2016.  Prescribed and confirmed dosage: Furosemide 80 mg 1 tablet twice a day.  Potassium 20 mEq 1 tablet daily.   Recommendations: No changes. Patient reported eating out and thinks that has cause his periods of fluid accumulation and advised restaurant foods are high in salt.  Advised to limit salt intake to 2000 mg/day and fluid intake to < 2 liters/day.  Encouraged to call for fluid symptoms or use local ER for any urgent symptoms.  Follow-up plan: ICM clinic phone appointment on 12/09/2016.   Copy of ICM check sent to device physician.   3 month ICM trend: 11/06/2016   1 Year ICM trend:      [name], RN 11/06/2016 2:34 PM

## 2016-11-19 NOTE — Progress Notes (Signed)
Patient left the office without being seen.

## 2016-11-27 ENCOUNTER — Other Ambulatory Visit: Payer: Self-pay | Admitting: Family Medicine

## 2016-11-27 DIAGNOSIS — G8929 Other chronic pain: Secondary | ICD-10-CM

## 2016-11-27 DIAGNOSIS — M545 Low back pain, unspecified: Secondary | ICD-10-CM

## 2016-12-08 ENCOUNTER — Other Ambulatory Visit: Payer: Self-pay

## 2016-12-08 DIAGNOSIS — I5043 Acute on chronic combined systolic (congestive) and diastolic (congestive) heart failure: Secondary | ICD-10-CM

## 2016-12-08 MED ORDER — FUROSEMIDE 80 MG PO TABS: ORAL_TABLET | ORAL | 1 refills | Status: DC

## 2016-12-09 ENCOUNTER — Telehealth: Payer: Self-pay | Admitting: Cardiology

## 2016-12-09 ENCOUNTER — Ambulatory Visit (INDEPENDENT_AMBULATORY_CARE_PROVIDER_SITE_OTHER): Payer: Medicaid Other | Admitting: *Deleted

## 2016-12-09 DIAGNOSIS — I428 Other cardiomyopathies: Secondary | ICD-10-CM | POA: Diagnosis not present

## 2016-12-09 DIAGNOSIS — I5022 Chronic systolic (congestive) heart failure: Secondary | ICD-10-CM | POA: Diagnosis not present

## 2016-12-09 DIAGNOSIS — Z95810 Presence of automatic (implantable) cardiac defibrillator: Secondary | ICD-10-CM | POA: Diagnosis not present

## 2016-12-09 NOTE — Telephone Encounter (Signed)
LMOVM reminding pt to send remote transmission.   

## 2016-12-11 NOTE — Progress Notes (Signed)
EPIC Encounter for ICM Monitoring  Patient Name: [name] is a 51 y.o. male Date: 12/11/2016 Primary Care Physican: Boykin Nearing, MD Primary Cardiologist:Crenshaw Electrophysiologist: Allred Dry TXHFSF:423 lbs      Heart Failure questions reviewed, pt asymptomatic for fluid symptoms.  He reported he had some chest pains yesterday which is different than what he has had before.  Advised him to go to the ER for evaluation.  He stated it was better today.   Thoracic impedance normal but was abnormal suggesting fluid accumulation 11/15/2016 to 11/24/2016.  Prescribed and confirmed dosage: Furosemide 80 mg 1 tablet twice a day. Potassium 20 mEq 1 tablet daily.   Recommendations: No changes.  Encouraged to call for fluid symptoms.  Follow-up plan: ICM clinic phone appointment on 01/13/2017.    Copy of ICM check sent to device physician.   3 month ICM trend: 12/10/2016   1 Year ICM trend:      [name], RN 12/11/2016 2:06 PM

## 2016-12-12 ENCOUNTER — Encounter: Payer: Self-pay | Admitting: Cardiology

## 2016-12-12 ENCOUNTER — Ambulatory Visit (HOSPITAL_COMMUNITY)
Admission: RE | Admit: 2016-12-12 | Discharge: 2016-12-12 | Disposition: A | Discharge status: Home / Self Care | Payer: Medicaid Other | Source: Ambulatory Visit | Attending: Family Medicine | Admitting: Family Medicine

## 2016-12-12 ENCOUNTER — Encounter: Payer: Self-pay | Admitting: Family Medicine

## 2016-12-12 ENCOUNTER — Other Ambulatory Visit: Payer: Self-pay

## 2016-12-12 ENCOUNTER — Ambulatory Visit: Payer: Medicaid Other | Attending: Family Medicine | Admitting: Family Medicine

## 2016-12-12 VITALS — BP 121/70 | HR 86 | Temp 97.9°F | Ht 73.0 in | Wt 259.6 lb

## 2016-12-12 DIAGNOSIS — Z7982 Long term (current) use of aspirin: Secondary | ICD-10-CM | POA: Insufficient documentation

## 2016-12-12 DIAGNOSIS — R079 Chest pain, unspecified: Secondary | ICD-10-CM

## 2016-12-12 DIAGNOSIS — Z87891 Personal history of nicotine dependence: Secondary | ICD-10-CM | POA: Insufficient documentation

## 2016-12-12 DIAGNOSIS — Z794 Long term (current) use of insulin: Secondary | ICD-10-CM | POA: Diagnosis not present

## 2016-12-12 DIAGNOSIS — Z79899 Other long term (current) drug therapy: Secondary | ICD-10-CM | POA: Diagnosis not present

## 2016-12-12 DIAGNOSIS — E118 Type 2 diabetes mellitus with unspecified complications: Secondary | ICD-10-CM | POA: Diagnosis not present

## 2016-12-12 DIAGNOSIS — G8929 Other chronic pain: Secondary | ICD-10-CM | POA: Diagnosis not present

## 2016-12-12 DIAGNOSIS — R350 Frequency of micturition: Secondary | ICD-10-CM | POA: Insufficient documentation

## 2016-12-12 DIAGNOSIS — R9431 Abnormal electrocardiogram [ECG] [EKG]: Secondary | ICD-10-CM | POA: Diagnosis not present

## 2016-12-12 DIAGNOSIS — N401 Enlarged prostate with lower urinary tract symptoms: Secondary | ICD-10-CM | POA: Insufficient documentation

## 2016-12-12 DIAGNOSIS — R0789 Other chest pain: Secondary | ICD-10-CM | POA: Diagnosis not present

## 2016-12-12 DIAGNOSIS — N138 Other obstructive and reflux uropathy: Secondary | ICD-10-CM | POA: Diagnosis not present

## 2016-12-12 DIAGNOSIS — E119 Type 2 diabetes mellitus without complications: Secondary | ICD-10-CM | POA: Diagnosis present

## 2016-12-12 DIAGNOSIS — R3911 Hesitancy of micturition: Secondary | ICD-10-CM | POA: Diagnosis not present

## 2016-12-12 LAB — GLUCOSE, POCT (MANUAL RESULT ENTRY): POC Glucose: 248 mg/dl — AB (ref 70–99)

## 2016-12-12 LAB — POCT GLYCOSYLATED HEMOGLOBIN (HGB A1C): HEMOGLOBIN A1C: 9.3

## 2016-12-12 MED ORDER — TAMSULOSIN HCL 0.4 MG PO CAPS: ORAL_CAPSULE | ORAL | 3 refills | Status: DC

## 2016-12-12 NOTE — Assessment & Plan Note (Signed)
Chest pains Normal vitals EKG unchanged from previous

## 2016-12-12 NOTE — Assessment & Plan Note (Signed)
Declined with rise in A1c Patient on metformin and januvia Declines additional medication at this time, offered jardiance

## 2016-12-12 NOTE — Progress Notes (Signed)
Subjective:  Patient ID: [name], male    DOB: [birth year]  Age: 51 y.o. MRN: [id number]  CC: Diabetes and Referral   HPI [name] has obesity, ischemic cardiomyopathy with systolic CHF s/p IC placement, diabetes he presents for   1. BPH symptoms: x 3 months of urinary frequency at night. Waking up every hr to urinate. Urinary hesitancy, taking 10 minutes to finish his urine stream. He denies dysuria and hematuria.   2. Diabetes: compliant with regimen. Walking 3 miles per day. Not compliant with low carb diet.   3. Chest pain: compliant with antihypertensive and diuretics. Reports 2 days of throbbing L upper chest pain. No associated cough or leg swelling.   Social History  Substance Use Topics  . Smoking status: Former Smoker    Packs/day: 1.00    Years: 28.00    Types: Cigarettes    Quit date: 06/03/2014  . Smokeless tobacco: Never Used  . Alcohol use 0.0 oz/week     Comment: 12/23/2013 "might drink a couple times/yr"    Outpatient Medications Prior to Visit  Medication Sig Dispense Refill  . ACCU-CHEK AVIVA PLUS test strip USE ONE STRIP THREE TIMES DAILY 100 each 12  . acetaminophen-codeine (TYLENOL #3) 300-30 MG tablet Take 1 tablet by mouth every 8 (eight) hours as needed for moderate pain. 90 tablet 0  . aspirin 81 MG tablet Take 81 mg by mouth daily.    . Blood Glucose Monitoring Suppl (ACCU-CHEK AVIVA PLUS) W/DEVICE KIT 1 Device by Does not apply route 3 (three) times daily after meals. 1 kit 0  . carvedilol (COREG) 25 MG tablet TAKE ONE TABLET BY MOUTH TWICE DAILY 180 tablet 3  . cyclobenzaprine (FLEXERIL) 5 MG tablet TAKE 1 TABLET BY MOUTH THREE TIMES DAILY AS NEEDED FOR MUSCLE SPASMS 60 tablet 1  . Elastic Bandages & Supports (LUMBAR BACK BRACE/SUPPORT PAD) MISC 1 each by Does not apply route daily. ICD 10 M54.5 1 each 0  . furosemide (LASIX) 80 MG tablet TAKE 1 TABLET BY MOUTH TWICE A DAY 180 tablet 1  . gabapentin (NEURONTIN) 300 MG capsule 300 mg in  the morning, 300 mg in afternoon and 600 mg in evening 120 capsule 3  . gabapentin (NEURONTIN) 300 MG capsule TAKE ONE CAPSULE BY MOUTH THREE TIMES DAILY 90 capsule 5  . JANUVIA 100 MG tablet TAKE ONE TABLET BY MOUTH ONCE DAILY 90 tablet 3  . KLOR-CON M20 20 MEQ tablet TAKE ONE TABLET BY MOUTH ONCE DAILY 90 tablet 3  . Lancet Devices (ACCU-CHEK SOFTCLIX) lancets 1 each by Other route 3 (three) times daily. 1 each 0  . levalbuterol (XOPENEX HFA) 45 MCG/ACT inhaler Inhale 1-2 puffs into the lungs every 6 (six) hours as needed for wheezing. 1 Inhaler 12  . metFORMIN (GLUCOPHAGE) 1000 MG tablet TAKE 1 TABLETS BY MOUTH IN THE MORNING THEN 1 AND 1/2 TABLET BY MOUTH IN THE EVENING 75 tablet 2  . pantoprazole (PROTONIX) 40 MG tablet TAKE 1 TABLET BY MOUTH ONCE DAILY 90 tablet 0  . sacubitril-valsartan (ENTRESTO) 24-26 MG Take 1 tablet by mouth 2 (two) times daily. 180 tablet 3  . triamcinolone cream (KENALOG) 0.1 % Apply 1 application topically 2 (two) times daily. 30 g 0   No facility-administered medications prior to visit.     ROS Review of Systems  Constitutional: Negative for chills, fatigue, fever and unexpected weight change.  Eyes: Negative for visual disturbance.  Respiratory: Negative for cough and shortness of  breath.   Cardiovascular: Positive for chest pain. Negative for palpitations and leg swelling.  Gastrointestinal: Negative for abdominal pain, blood in stool, constipation, diarrhea, nausea and vomiting.  Endocrine: Negative for polydipsia, polyphagia and polyuria.  Genitourinary: Positive for difficulty urinating and urgency. Negative for dysuria.  Musculoskeletal: Negative for arthralgias, back pain, gait problem, myalgias and neck pain.  Skin: Negative for rash.  Allergic/Immunologic: Negative for immunocompromised state.  Hematological: Negative for adenopathy. Does not bruise/bleed easily.  Psychiatric/Behavioral: Negative for dysphoric mood, sleep disturbance and suicidal  ideas. The patient is not nervous/anxious.     Objective:  BP 121/70   Pulse 86   Temp 97.9 F (36.6 C) (Oral)   Ht 6' 1"  (1.854 m)   Wt 259 lb 9.6 oz (117.8 kg)   SpO2 95%   BMI 34.25 kg/m   BP/Weight 12/12/2016 4/94/4967 10/15/1636  Systolic BP 466 599 357  Diastolic BP 70 75 70  Wt. (Lbs) 259.6 245 246  BMI 34.25 32.32 32.46    Physical Exam  Constitutional: He appears well-developed and well-nourished. No distress.  HENT:  Head: Normocephalic and atraumatic.  Neck: Normal range of motion. Neck supple.  Cardiovascular: Normal rate, regular rhythm, normal heart sounds and intact distal pulses.   Pulmonary/Chest: Effort normal and breath sounds normal.  Genitourinary: Prostate is enlarged. Prostate is not tender.  Musculoskeletal: He exhibits no edema.  Neurological: He is alert.  Skin: Skin is warm and dry. No rash noted. No erythema.  Psychiatric: He has a normal mood and affect.    Lab Results  Component Value Date   HGBA1C 7.6 07/21/2016   Lab Results  Component Value Date   HGBA1C 9.3 12/12/2016    CBG 248 EKG: normal EKG, normal sinus rhythm, unchanged from previous tracings.    Chemistry      Component Value Date/Time   NA 142 07/21/2016 1503   K 4.5 07/21/2016 1503   CL 101 07/21/2016 1503   CO2 24 07/21/2016 1503   BUN 19 07/21/2016 1503   CREATININE 1.53 (H) 07/21/2016 1503   CREATININE 1.68 (H) 04/24/2016 1502      Component Value Date/Time   CALCIUM 9.8 07/21/2016 1503   ALKPHOS 53 09/08/2014 0943   AST 15 09/08/2014 0943   ALT 22 09/08/2014 0943   BILITOT 0.6 09/08/2014 0943       Assessment & Plan:   [name] was seen today for diabetes and referral.  Diagnoses and all orders for this visit:  Controlled type 2 diabetes mellitus with complication, with long-term current use of insulin (HCC) -     POCT glucose (manual entry) -     POCT glycosylated hemoglobin (Hb A1C)  BPH with obstruction/lower urinary tract symptoms -     PSA;  Future -     tamsulosin (FLOMAX) 0.4 MG CAPS capsule; Take by mouth 1 capsule 1 hr after breakfast for two weeks, then increase to 0.8 mg daily  Chest pain, unspecified type -     EKG 12-Lead   No orders of the defined types were placed in this encounter.   Follow-up: Return in about 3 months (around 03/14/2017) for diabetes and enlarged prostate .   Boykin Nearing MD

## 2016-12-12 NOTE — Patient Instructions (Addendum)
[name] was seen today for diabetes and referral.  Diagnoses and all orders for this visit:  Controlled type 2 diabetes mellitus with complication, with long-term current use of insulin (HCC) -     POCT glucose (manual entry) -     POCT glycosylated hemoglobin (Hb A1C)  BPH with obstruction/lower urinary tract symptoms -     PSA; Future -     tamsulosin (FLOMAX) 0.4 MG CAPS capsule; Take by mouth 1 capsule 1 hr after breakfast for two weeks, then increase to 0.8 mg daily  Chest pain, unspecified type -     EKG 12-Lead   Your EKG is normal  I recommend tylenol and heat for chest pain  Return next week for PSA and to give urinalysis for BPH  F/u in 3 months for diabetes and enlarged prostate   Starting on January 26, 2017 I will be seeing patient at [facility]. You are welcome to follow up with me there if you like, but I do recommend that you meet Dr. Wynetta Emery first given your cardiologist is in the [facility]. [name] accepts insurance and self pay.   [facility] at [facility]  [address] [place], [place] [address]  Ph: [phone] Fax: [phone]  Dr .Adrian Blackwater

## 2016-12-12 NOTE — Assessment & Plan Note (Signed)
BPH symptoms Patient to start flomax Return for PSA and UA

## 2016-12-25 ENCOUNTER — Telehealth: Payer: Self-pay | Admitting: Cardiology

## 2016-12-25 NOTE — Telephone Encounter (Signed)
LMOVM requesting that pt send manual transmission b/c home monitor has not updated in at least 7 days.    

## 2017-01-03 ENCOUNTER — Other Ambulatory Visit: Payer: Self-pay | Admitting: Podiatry

## 2017-01-03 ENCOUNTER — Other Ambulatory Visit: Payer: Self-pay | Admitting: Family Medicine

## 2017-01-03 DIAGNOSIS — K219 Gastro-esophageal reflux disease without esophagitis: Secondary | ICD-10-CM

## 2017-01-05 NOTE — Telephone Encounter (Signed)
Pt needs an appt prior to future refills. 

## 2017-01-06 LAB — CUP PACEART REMOTE DEVICE CHECK
Battery Remaining Longevity: 80 mo
Battery Remaining Percentage: 75 %
Date Time Interrogation Session: 20180704055027
HIGH POWER IMPEDANCE MEASURED VALUE: 79 Ohm
HIGH POWER IMPEDANCE MEASURED VALUE: 79 Ohm
Lead Channel Impedance Value: 400 Ohm
Lead Channel Setting Pacing Amplitude: 2.5 V
Lead Channel Setting Pacing Pulse Width: 0.5 ms
MDC IDC LEAD IMPLANT DT: 20150717
MDC IDC LEAD LOCATION: 753860
MDC IDC MSMT BATTERY VOLTAGE: 2.98 V
MDC IDC MSMT LEADCHNL RV PACING THRESHOLD AMPLITUDE: 0.75 V
MDC IDC MSMT LEADCHNL RV PACING THRESHOLD PULSEWIDTH: 0.5 ms
MDC IDC MSMT LEADCHNL RV SENSING INTR AMPL: 12 mV
MDC IDC PG IMPLANT DT: 20150717
MDC IDC PG SERIAL: 7196009
MDC IDC SET LEADCHNL RV SENSING SENSITIVITY: 0.5 mV
MDC IDC STAT BRADY RV PERCENT PACED: 1 %

## 2017-01-13 ENCOUNTER — Telehealth: Payer: Self-pay

## 2017-01-13 NOTE — Telephone Encounter (Signed)
Call to patient and requested to send remote transmission.  He said he would send later today.

## 2017-01-19 ENCOUNTER — Ambulatory Visit (INDEPENDENT_AMBULATORY_CARE_PROVIDER_SITE_OTHER): Payer: Self-pay

## 2017-01-19 ENCOUNTER — Other Ambulatory Visit: Payer: Self-pay | Admitting: Family Medicine

## 2017-01-19 DIAGNOSIS — G8929 Other chronic pain: Secondary | ICD-10-CM

## 2017-01-19 DIAGNOSIS — I5022 Chronic systolic (congestive) heart failure: Secondary | ICD-10-CM

## 2017-01-19 DIAGNOSIS — Z95810 Presence of automatic (implantable) cardiac defibrillator: Secondary | ICD-10-CM

## 2017-01-19 DIAGNOSIS — M545 Low back pain: Principal | ICD-10-CM

## 2017-01-19 NOTE — Progress Notes (Signed)
EPIC Encounter for ICM Monitoring  Patient Name: [name] is a 51 y.o. male Date: 01/19/2017 Primary Care Physican: Boykin Nearing, MD Primary Cardiologist:Crenshaw Electrophysiologist: Allred Dry Weight:Previous ICM weight 243lbs      Heart Failure questions reviewed, pt asymptomatic.   Thoracic impedance normal but was abnormal suggesting fluid accumulation from 12/15/2016 to 12/20/2016 and 12/25/2016 to 12/29/2016.  Prescribed dosage: Furosemide 80 mg 1 tablet twice a day. Potassium 20 mEq 1 tablet daily.   Recommendations: No changes.  Encouraged to call for fluid symptoms.  Follow-up plan: ICM clinic phone appointment on 02/19/2017.   Copy of ICM check sent to device physician.   3 month ICM trend: 01/17/2017   1 Year ICM trend:      [name], RN 01/19/2017 8:46 AM

## 2017-01-20 ENCOUNTER — Telehealth: Payer: Self-pay | Admitting: Cardiology

## 2017-01-20 NOTE — Telephone Encounter (Signed)
[employer]( Cover My Meds) is calling to get prior authorization for Entresto 24-26mg  . Please call and refer to reference  [id number].. Thanks

## 2017-01-20 NOTE — Telephone Encounter (Signed)
entresto PA started, #[id number].

## 2017-01-22 NOTE — Telephone Encounter (Signed)
entresto PA approved.

## 2017-02-19 ENCOUNTER — Telehealth: Payer: Self-pay

## 2017-02-19 NOTE — Telephone Encounter (Signed)
Spoke with pt and reminded pt of remote transmission that is due today. Pt verbalized understanding.   

## 2017-02-20 NOTE — Progress Notes (Signed)
No ICM remote transmission received for 02/19/2017 and next ICM transmission scheduled for 03/23/2017.

## 2017-03-04 ENCOUNTER — Other Ambulatory Visit: Payer: Self-pay | Admitting: Family Medicine

## 2017-03-04 DIAGNOSIS — Z794 Long term (current) use of insulin: Principal | ICD-10-CM

## 2017-03-04 DIAGNOSIS — E118 Type 2 diabetes mellitus with unspecified complications: Secondary | ICD-10-CM

## 2017-03-06 ENCOUNTER — Telehealth: Payer: Self-pay | Admitting: Cardiology

## 2017-03-06 NOTE — Telephone Encounter (Signed)
LMOVM requesting that pt send manual transmission b/c home monitor has not updated in at least 14 days.    

## 2017-03-09 ENCOUNTER — Other Ambulatory Visit: Payer: Self-pay | Admitting: Family Medicine

## 2017-03-09 DIAGNOSIS — M545 Low back pain: Principal | ICD-10-CM

## 2017-03-09 DIAGNOSIS — G8929 Other chronic pain: Secondary | ICD-10-CM

## 2017-03-13 ENCOUNTER — Other Ambulatory Visit: Payer: Self-pay | Admitting: Family Medicine

## 2017-03-13 DIAGNOSIS — G8929 Other chronic pain: Secondary | ICD-10-CM

## 2017-03-13 DIAGNOSIS — M545 Low back pain: Principal | ICD-10-CM

## 2017-03-13 NOTE — Telephone Encounter (Signed)
Pt called to request a refill for cyclobenzaprine (FLEXERIL) 5 MG tablet  Please sent it to the [facility] on [address] Please follow up

## 2017-03-17 ENCOUNTER — Ambulatory Visit: Payer: Medicaid Other | Admitting: Internal Medicine

## 2017-03-23 ENCOUNTER — Ambulatory Visit (INDEPENDENT_AMBULATORY_CARE_PROVIDER_SITE_OTHER): Payer: Medicaid Other | Admitting: *Deleted

## 2017-03-23 ENCOUNTER — Telehealth: Payer: Self-pay | Admitting: Cardiology

## 2017-03-23 DIAGNOSIS — I428 Other cardiomyopathies: Secondary | ICD-10-CM | POA: Diagnosis not present

## 2017-03-23 NOTE — Telephone Encounter (Signed)
LMOVM reminding pt to send remote transmission.   

## 2017-03-26 ENCOUNTER — Telehealth: Payer: Self-pay | Admitting: Cardiology

## 2017-03-26 NOTE — Telephone Encounter (Signed)
Spoke w/ pt and requested that he send a manual transmission b/c his home monitor has not updated in at least 7 days.   

## 2017-03-27 NOTE — Progress Notes (Signed)
Remote ICD transmission.   

## 2017-03-28 ENCOUNTER — Other Ambulatory Visit: Payer: Self-pay | Admitting: Cardiology

## 2017-03-28 DIAGNOSIS — I429 Cardiomyopathy, unspecified: Secondary | ICD-10-CM

## 2017-04-02 NOTE — Progress Notes (Signed)
No ICM remote transmission received for 03/23/2017 and next ICM transmission scheduled for 04/13/2017.

## 2017-04-03 ENCOUNTER — Encounter: Payer: Self-pay | Admitting: Cardiology

## 2017-04-09 ENCOUNTER — Other Ambulatory Visit: Payer: Self-pay | Admitting: Pharmacist

## 2017-04-09 DIAGNOSIS — K219 Gastro-esophageal reflux disease without esophagitis: Secondary | ICD-10-CM

## 2017-04-09 MED ORDER — PANTOPRAZOLE SODIUM 40 MG PO TBEC: 40.0000 mg | DELAYED_RELEASE_TABLET | Freq: Every day | ORAL | 0 refills | Status: DC

## 2017-04-10 LAB — CUP PACEART REMOTE DEVICE CHECK
Brady Statistic RV Percent Paced: 1 %
HIGH POWER IMPEDANCE MEASURED VALUE: 74 Ohm
HighPow Impedance: 74 Ohm
Implantable Lead Location: 753860
Implantable Pulse Generator Implant Date: 20150717
Lead Channel Pacing Threshold Pulse Width: 0.5 ms
Lead Channel Setting Pacing Pulse Width: 0.5 ms
MDC IDC LEAD IMPLANT DT: 20150717
MDC IDC MSMT BATTERY REMAINING LONGEVITY: 77 mo
MDC IDC MSMT BATTERY REMAINING PERCENTAGE: 73 %
MDC IDC MSMT BATTERY VOLTAGE: 2.98 V
MDC IDC MSMT LEADCHNL RV IMPEDANCE VALUE: 360 Ohm
MDC IDC MSMT LEADCHNL RV PACING THRESHOLD AMPLITUDE: 0.75 V
MDC IDC MSMT LEADCHNL RV SENSING INTR AMPL: 11.8 mV
MDC IDC SESS DTM: 20181018180419
MDC IDC SET LEADCHNL RV PACING AMPLITUDE: 2.5 V
MDC IDC SET LEADCHNL RV SENSING SENSITIVITY: 0.5 mV
Pulse Gen Serial Number: 7196009

## 2017-04-14 ENCOUNTER — Other Ambulatory Visit: Payer: Self-pay | Admitting: Pharmacist

## 2017-04-14 DIAGNOSIS — E118 Type 2 diabetes mellitus with unspecified complications: Secondary | ICD-10-CM

## 2017-04-14 DIAGNOSIS — Z794 Long term (current) use of insulin: Principal | ICD-10-CM

## 2017-04-14 MED ORDER — METFORMIN HCL 1000 MG PO TABS
ORAL_TABLET | ORAL | 0 refills | Status: DC
Start: 2017-04-14 — End: 2017-05-27

## 2017-04-17 ENCOUNTER — Encounter: Payer: Self-pay | Admitting: Internal Medicine

## 2017-04-17 ENCOUNTER — Ambulatory Visit: Payer: Medicaid Other | Attending: Internal Medicine | Admitting: Internal Medicine

## 2017-04-17 VITALS — BP 115/76 | HR 82 | Temp 98.1°F | Resp 16 | Wt 263.2 lb

## 2017-04-17 DIAGNOSIS — F419 Anxiety disorder, unspecified: Secondary | ICD-10-CM | POA: Diagnosis not present

## 2017-04-17 DIAGNOSIS — Z7982 Long term (current) use of aspirin: Secondary | ICD-10-CM | POA: Insufficient documentation

## 2017-04-17 DIAGNOSIS — M479 Spondylosis, unspecified: Secondary | ICD-10-CM | POA: Diagnosis not present

## 2017-04-17 DIAGNOSIS — I5022 Chronic systolic (congestive) heart failure: Secondary | ICD-10-CM | POA: Insufficient documentation

## 2017-04-17 DIAGNOSIS — Z6834 Body mass index (BMI) 34.0-34.9, adult: Secondary | ICD-10-CM | POA: Diagnosis not present

## 2017-04-17 DIAGNOSIS — E1165 Type 2 diabetes mellitus with hyperglycemia: Secondary | ICD-10-CM

## 2017-04-17 DIAGNOSIS — Z87891 Personal history of nicotine dependence: Secondary | ICD-10-CM | POA: Diagnosis not present

## 2017-04-17 DIAGNOSIS — Z9049 Acquired absence of other specified parts of digestive tract: Secondary | ICD-10-CM | POA: Diagnosis not present

## 2017-04-17 DIAGNOSIS — J40 Bronchitis, not specified as acute or chronic: Secondary | ICD-10-CM | POA: Insufficient documentation

## 2017-04-17 DIAGNOSIS — Z87438 Personal history of other diseases of male genital organs: Secondary | ICD-10-CM | POA: Diagnosis not present

## 2017-04-17 DIAGNOSIS — E1142 Type 2 diabetes mellitus with diabetic polyneuropathy: Secondary | ICD-10-CM | POA: Insufficient documentation

## 2017-04-17 DIAGNOSIS — E118 Type 2 diabetes mellitus with unspecified complications: Secondary | ICD-10-CM | POA: Diagnosis not present

## 2017-04-17 DIAGNOSIS — I428 Other cardiomyopathies: Secondary | ICD-10-CM | POA: Diagnosis not present

## 2017-04-17 DIAGNOSIS — M4800 Spinal stenosis, site unspecified: Secondary | ICD-10-CM | POA: Insufficient documentation

## 2017-04-17 DIAGNOSIS — IMO0002 Reserved for concepts with insufficient information to code with codable children: Secondary | ICD-10-CM

## 2017-04-17 DIAGNOSIS — Z2821 Immunization not carried out because of patient refusal: Secondary | ICD-10-CM

## 2017-04-17 DIAGNOSIS — N401 Enlarged prostate with lower urinary tract symptoms: Secondary | ICD-10-CM | POA: Insufficient documentation

## 2017-04-17 DIAGNOSIS — Z79899 Other long term (current) drug therapy: Secondary | ICD-10-CM | POA: Diagnosis not present

## 2017-04-17 DIAGNOSIS — N138 Other obstructive and reflux uropathy: Secondary | ICD-10-CM | POA: Insufficient documentation

## 2017-04-17 DIAGNOSIS — K589 Irritable bowel syndrome without diarrhea: Secondary | ICD-10-CM | POA: Insufficient documentation

## 2017-04-17 DIAGNOSIS — Z95810 Presence of automatic (implantable) cardiac defibrillator: Secondary | ICD-10-CM | POA: Diagnosis not present

## 2017-04-17 DIAGNOSIS — Z794 Long term (current) use of insulin: Secondary | ICD-10-CM | POA: Diagnosis not present

## 2017-04-17 DIAGNOSIS — K219 Gastro-esophageal reflux disease without esophagitis: Secondary | ICD-10-CM | POA: Insufficient documentation

## 2017-04-17 DIAGNOSIS — Z2882 Immunization not carried out because of caregiver refusal: Secondary | ICD-10-CM | POA: Diagnosis not present

## 2017-04-17 DIAGNOSIS — E669 Obesity, unspecified: Secondary | ICD-10-CM | POA: Diagnosis not present

## 2017-04-17 LAB — GLUCOSE, POCT (MANUAL RESULT ENTRY): POC Glucose: 195 mg/dl — AB (ref 70–99)

## 2017-04-17 LAB — POCT GLYCOSYLATED HEMOGLOBIN (HGB A1C): HEMOGLOBIN A1C: 9.9

## 2017-04-17 MED ORDER — BENZONATATE 100 MG PO CAPS: 100.0000 mg | ORAL_CAPSULE | Freq: Two times a day (BID) | ORAL | 0 refills | Status: DC | PRN

## 2017-04-17 MED ORDER — AZITHROMYCIN 250 MG PO TABS: ORAL_TABLET | ORAL | 0 refills | Status: DC

## 2017-04-17 NOTE — Progress Notes (Signed)
Patient ID: [name], male    DOB: 21-May-1966  MRN: [id number]  CC: re-establish and Diabetes   Subjective: [name] is a 51 y.o. male who presents for chronic disease management.  Last saw Dr. Adrian Blackwater 12/2016 His concerns today include:  Patient with history of ICM, systolic CHF status post ICD placement, diabetes type 2, obesity, BPH, and chronic lower back pain secondary to spondylosis and spinal stenosis.   1. BPH: passing urine ok on Flomax  2. C/o coughing and feeling weak x 1 day. Sputum, yellow with blood mixed in. Quit smoking 2 yrs ago. Was a 2 pk/day smoker -Subjective fever yesterday for which he took 3 Tylenols -no increase SOB -States he was given guaifenesin with codeine in the past and would like a prescription for the same  3. DM :  A1c was not at goal on last visit.  Patient declined adding medication at that time checks BS occasionally -Med: compliant with meds -metformin and Januvia Exercise: walks 3 miles a day Eating habits: feels he can do better with eating habits. Loves ice cream Has numbness in hands and feet.  He is on gabapentin. Requests forms be completed for him to get a handicap sticker.  4. CHF/ICM:  -no PND/orthopena -no LE edema.  Compliant with carvedilol, furosemide, potassium supplement, Entresto   Patient Active Problem List   Diagnosis Date Noted  . 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  . Great toe pain, right 07/22/2016  . Exertional dyspnea 02/28/2016  . Hypersomnia 02/28/2016  . Abnormal PFT 02/24/2016  . Diabetic polyneuropathy associated with type 2 diabetes mellitus ([place]) 12/18/2015  . Lumbar back pain 12/18/2015  . Controlled type 2 diabetes mellitus with complication, with long-term current use of insulin ([place]) 09/17/2015  . 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 Lesion of right median nerve at forearm 04/16/2015  . ICD (implantable cardioverter-defibrillator) in place 03/28/2014  .  Chronic systolic congestive heart failure ([place]) 03/28/2014  . Ventricular tachycardia, non-sustained ([place]) 03/27/2014  . Nonischemic cardiomyopathy ([place]) 11/09/2013  . Chronic systolic dysfunction of left ventricle 11/09/2013  . Syncope 10/20/2013  . Fracture of humeral shaft, left, closed 08/12/2013    Class: Diagnosis of  . Chronic chest pain 07/28/2013  . 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 RENAL CALCULUS 05/13/2009  . Obesity 05/08/2009  . Former heavy cigarette smoker (20-39 per day) 05/08/2009  . HOARSENESS 05/08/2009  . ANXIETY 02/28/2008  . GERD 02/28/2008  . IRRITABLE BOWEL SYNDROME 02/28/2008     Current Outpatient Medications on File Prior to Visit  Medication Sig Dispense Refill  . ACCU-CHEK AVIVA PLUS test strip USE ONE STRIP THREE TIMES DAILY 100 each 12  . acetaminophen-codeine (TYLENOL #3) 300-30 MG tablet Take 1 tablet by mouth every 8 (eight) hours as needed for moderate pain. 90 tablet 0  . aspirin 81 MG tablet Take 81 mg by mouth daily.    . Blood Glucose Monitoring Suppl (ACCU-CHEK AVIVA PLUS) W/DEVICE KIT 1 Device by Does not apply route 3 (three) times daily after meals. 1 kit 0  . carvedilol (COREG) 25 MG tablet TAKE ONE TABLET BY MOUTH TWICE DAILY 180 tablet 3  . cyclobenzaprine (FLEXERIL) 5 MG tablet TAKE 1 TABLET BY MOUTH THREE TIMES DAILY AS NEEDED FOR MUSCLE SPASM 60 tablet 0  . Elastic Bandages & Supports (LUMBAR BACK BRACE/SUPPORT PAD) MISC 1 each by Does not apply route daily. ICD 10 M54.5 1 each 0  . furosemide (LASIX)  80 MG tablet TAKE 1 TABLET BY MOUTH TWICE A DAY 180 tablet 1  . gabapentin (NEURONTIN) 300 MG capsule 300 mg in the morning, 300 mg in afternoon and 600 mg in evening 120 capsule 3  . gabapentin (NEURONTIN) 300 MG capsule TAKE ONE CAPSULE BY MOUTH THREE TIMES DAILY 90 capsule 5  . JANUVIA 100 MG tablet TAKE ONE TABLET BY MOUTH ONCE DAILY 90 tablet 3  . KLOR-CON M20 20 MEQ tablet TAKE ONE TABLET BY MOUTH ONCE DAILY 90 tablet  3  . Lancet Devices (ACCU-CHEK SOFTCLIX) lancets 1 each by Other route 3 (three) times daily. 1 each 0  . levalbuterol (XOPENEX HFA) 45 MCG/ACT inhaler Inhale 1-2 puffs into the lungs every 6 (six) hours as needed for wheezing. 1 Inhaler 12  . metFORMIN (GLUCOPHAGE) 1000 MG tablet TAKE 1 TABLET BY MOUTH ONCE DAILY IN THE MORNING AND 1 & 1/2 (ONE & ONE-HALF) ONCE DAILY IN THE EVENING 75 tablet 0  . pantoprazole (PROTONIX) 40 MG tablet Take 1 tablet (40 mg total) by mouth daily. 30 tablet 0  . sacubitril-valsartan (ENTRESTO) 24-26 MG Take 1 tablet by mouth 2 (two) times daily. 180 tablet 3  . tamsulosin (FLOMAX) 0.4 MG CAPS capsule Take by mouth 1 capsule 1 hr after breakfast for two weeks, then increase to 0.8 mg daily 60 capsule 3  . triamcinolone cream (KENALOG) 0.1 % Apply 1 application topically 2 (two) times daily. 30 g 0   No current facility-administered medications on file prior to visit.     Allergies  Allergen Reactions  . Bee Venom Anaphylaxis    Social History   Socioeconomic History  . Marital status: Single    Spouse name: Not on file  . Number of children: Not on file  . Years of education: Not on file  . Highest education level: Not on file  Social Needs  . Financial resource strain: Not on file  . Food insecurity - worry: Not on file  . Food insecurity - inability: Not on file  . Transportation needs - medical: Not on file  . Transportation needs - non-medical: Not on file  Occupational History  . Not on file  Tobacco Use  . Smoking status: Former Smoker    Packs/day: 1.00    Years: 28.00    Pack years: 28.00    Types: Cigarettes    Last attempt to quit: 06/03/2014    Years since quitting: 2.8  . Smokeless tobacco: Never Used  Substance and Sexual Activity  . Alcohol use: Yes    Alcohol/week: 0.0 oz    Comment: 12/23/2013 "might drink a couple times/yr"  . Drug use: Yes    Types: Marijuana  . Sexual activity: Yes  Other Topics Concern  . Not on file    Social History Narrative  . Not on file    Family History  Problem Relation Age of Onset  . Diabetes Father   . Arthritis Father   . Diabetes Mother   . Arthritis Mother   . Hypertension Brother     Past Surgical History:  Procedure Laterality Date  . CARDIAC CATHETERIZATION  08/10/13  . CHOLECYSTECTOMY  ~ 2010  . IMPLANTABLE CARDIOVERTER DEFIBRILLATOR IMPLANT  12/23/2013   STJ Fortify ICD implanted by [name] for cardiomyopathy and syncope  . URETEROSCOPY     "laser for kidney stones"    ROS: Review of Systems Negative except as stated above PHYSICAL EXAM: BP 115/76   Pulse 82   Temp  98.1 F (36.7 C) (Oral)   Resp 16   Wt 263 lb 3.2 oz (119.4 kg)   SpO2 96%   BMI 34.73 kg/m    Wt Readings from Last 3 Encounters:  04/17/17 263 lb 3.2 oz (119.4 kg)  12/12/16 259 lb 9.6 oz (117.8 kg)  11/04/16 245 lb (111.1 kg)   Physical Exam General appearance - alert, well appearing, middle-aged African-American male and in no distress.  Patient has a raspy voice which he states is his baseline Mental status - alert, oriented to person, place, and time, normal mood, behavior, speech, dress, motor activity, and thought processes Eyes - pupils equal and reactive, extraocular eye movements intact Nose -mild enlargement of nasal turbinate on the left with clear mucus Mouth - mucous membranes moist, pharynx normal without lesions Chest - clear to auscultation, no wheezes, rales or rhonchi, symmetric air entry Heart - normal rate, regular rhythm, normal S1, S2, no murmurs, rubs, clicks or gallops Extremities -no lower extremity edema Results for orders placed or performed in visit on 04/17/17  POCT glucose (manual entry)  Result Value Ref Range   POC Glucose 195 (A) 70 - 99 mg/dl  POCT glycosylated hemoglobin (Hb A1C)  Result Value Ref Range   Hemoglobin A1C 9.9     ASSESSMENT AND PLAN: 1. Uncontrolled type 2 diabetes mellitus with complication, without long-term current use of  insulin ([place]) Discussed the importance of healthy eating habits, regular aerobic exercise (at least 150 minutes a week as tolerated) and medication compliance to achieve or maintain control of diabetes. -Given that his A1c has not improved from last visit I have recommended adding another medication.  Patient declined stating he wants to give himself another 3 months to get his level better -Encourage patient to discontinue eating ice cream several times a week; instead choose healthier snacks - POCT glucose (manual entry) - POCT glycosylated hemoglobin (Hb A1C) - Microalbumin / creatinine urine ratio - CBC - Comprehensive metabolic panel - Lipid panel  2. Bronchitis - azithromycin (ZITHROMAX) 250 MG tablet; 2 tabs PO x 1 then 1 tab PO daily x 4 days  Dispense: 6 tablet; Refill: 0 - benzonatate (TESSALON) 100 MG capsule; Take 1 capsule (100 mg total) 2 (two) times daily as needed by mouth for cough.  Dispense: 20 capsule; Refill: 0  3. History of BPH - PSA  4. Chronic systolic congestive heart failure (HCC) Compensated. Continue current medications and salt restriction  5. Influenza vaccination declined  Form completed for handicap sticker  Patient was given the opportunity to ask questions.  Patient verbalized understanding of the plan and was able to repeat key elements of the plan.   Orders Placed This Encounter  Procedures  . Microalbumin / creatinine urine ratio  . POCT glucose (manual entry)  . POCT glycosylated hemoglobin (Hb A1C)     Requested Prescriptions    No prescriptions requested or ordered in this encounter    Follow-up in 3 months Karle Plumber, MD, Rosalita Chessman

## 2017-04-17 NOTE — Patient Instructions (Signed)
I have prescribed the antibiotic called Zithromax for 5 days and cough tablets called Tessalon Perles to use as need for the cough.   Your diabetes is not well controlled. Continue medications and improve on eating habits.  Cut back on eating ice cream.   Follow a Healthy Eating Plan - You can do it! Limit sugary drinks.  Avoid sodas, sweet tea, sport or energy drinks, or fruit drinks.  Drink water, lo-fat milk, or diet drinks. Limit snack foods.   Cut back on candy, cake, cookies, chips, ice cream.  These are a special treat, only in small amounts. Eat plenty of vegetables.  Especially dark green, red, and orange vegetables. Aim for at least 3 servings a day. More is better! Include fruit in your daily diet.  Whole fruit is much healthier than fruit juice! Limit "white" bread, "white" pasta, "white" rice.   Choose "100% whole grain" products, brown or wild rice. Avoid fatty meats. Try "Meatless Monday" and choose eggs or beans one day a week.  When eating meat, choose lean meats like chicken, [place], and fish.  Grill, broil, or bake meats instead of frying, and eat poultry without the skin. Eat less salt.  Avoid frozen pizzas, frozen dinners and salty foods.  Use seasonings other than salt in cooking.  This can help blood pressure and keep you from swelling Beer, wine and liquor have calories.  If you can safely drink alcohol, limit to 1 drink per day for women, 2 drinks for men

## 2017-04-18 ENCOUNTER — Other Ambulatory Visit: Payer: Self-pay | Admitting: Internal Medicine

## 2017-04-18 LAB — LIPID PANEL
CHOL/HDL RATIO: 5.5 ratio — AB (ref 0.0–5.0)
Cholesterol, Total: 204 mg/dL — ABNORMAL HIGH (ref 100–199)
HDL: 37 mg/dL — AB (ref >39)
LDL Calculated: 103 mg/dL — ABNORMAL HIGH (ref 0–99)
Triglycerides: 321 mg/dL — ABNORMAL HIGH (ref 0–149)
VLDL Cholesterol Cal: 64 mg/dL — ABNORMAL HIGH (ref 5–40)

## 2017-04-18 LAB — COMPREHENSIVE METABOLIC PANEL
A/G RATIO: 1.4 (ref 1.2–2.2)
ALBUMIN: 4.5 g/dL (ref 3.5–5.5)
ALT: 18 IU/L (ref 0–44)
AST: 15 IU/L (ref 0–40)
Alkaline Phosphatase: 53 IU/L (ref 39–117)
BILIRUBIN TOTAL: 0.3 mg/dL (ref 0.0–1.2)
BUN / CREAT RATIO: 11 (ref 9–20)
BUN: 14 mg/dL (ref 6–24)
CALCIUM: 9.4 mg/dL (ref 8.7–10.2)
CHLORIDE: 100 mmol/L (ref 96–106)
CO2: 22 mmol/L (ref 20–29)
Creatinine, Ser: 1.32 mg/dL — ABNORMAL HIGH (ref 0.76–1.27)
GFR, EST AFRICAN AMERICAN: 71 mL/min/{1.73_m2} (ref >59)
GFR, EST NON AFRICAN AMERICAN: 62 mL/min/{1.73_m2} (ref >59)
GLOBULIN, TOTAL: 3.3 g/dL (ref 1.5–4.5)
Glucose: 161 mg/dL — ABNORMAL HIGH (ref 65–99)
POTASSIUM: 4.1 mmol/L (ref 3.5–5.2)
SODIUM: 141 mmol/L (ref 134–144)
TOTAL PROTEIN: 7.8 g/dL (ref 6.0–8.5)

## 2017-04-18 LAB — CBC
HEMATOCRIT: 35.6 % — AB (ref 37.5–51.0)
Hemoglobin: 12 g/dL — ABNORMAL LOW (ref 13.0–17.7)
MCH: 30.5 pg (ref 26.6–33.0)
MCHC: 33.7 g/dL (ref 31.5–35.7)
MCV: 90 fL (ref 79–97)
Platelets: 341 10*3/uL (ref 150–379)
RBC: 3.94 x10E6/uL — AB (ref 4.14–5.80)
RDW: 14.8 % (ref 12.3–15.4)
WBC: 5.4 10*3/uL (ref 3.4–10.8)

## 2017-04-18 LAB — MICROALBUMIN / CREATININE URINE RATIO
CREATININE, UR: 45.3 mg/dL
Microalb/Creat Ratio: 17.2 mg/g creat (ref 0.0–30.0)
Microalbumin, Urine: 7.8 ug/mL

## 2017-04-18 LAB — PSA: PROSTATE SPECIFIC AG, SERUM: 0.7 ng/mL (ref 0.0–4.0)

## 2017-04-18 MED ORDER — ATORVASTATIN CALCIUM 10 MG PO TABS: 10.0000 mg | ORAL_TABLET | Freq: Every day | ORAL | 11 refills | Status: DC

## 2017-04-21 ENCOUNTER — Telehealth: Payer: Self-pay

## 2017-04-21 NOTE — Telephone Encounter (Signed)
Contacted pt to go over lab results pt didn't answer lvm asking pt to give me a call to give over results  If pt calls back please give results: cholesterol is elevated. This increases risk for heart attacks and strokes in pts with DM. I recommend starting med called Lipitor to help lower cholesterol. Rxn sent to his pharmacy. He has a mild anemia that is stable compared to 2 yrs ago. Kidney function improved from 1 yr ago. Liver function tests normal.

## 2017-05-07 ENCOUNTER — Telehealth: Payer: Self-pay | Admitting: Cardiology

## 2017-05-07 NOTE — Telephone Encounter (Signed)
LMOVM reminding pt to send remote transmission.   

## 2017-05-07 NOTE — Telephone Encounter (Signed)
Pt says his primary doctor put him on Lipitor. He wants to know if Lipitor is alright for him to take with his other medicine?

## 2017-05-07 NOTE — Telephone Encounter (Signed)
Left message for patient, okay for lipitor.

## 2017-05-10 ENCOUNTER — Other Ambulatory Visit: Payer: Self-pay | Admitting: Internal Medicine

## 2017-05-10 DIAGNOSIS — K219 Gastro-esophageal reflux disease without esophagitis: Secondary | ICD-10-CM

## 2017-05-11 NOTE — Progress Notes (Unsigned)
No ICM remote transmission received for 05/07/2017 and next ICM transmission scheduled for 06/22/2017.

## 2017-05-27 ENCOUNTER — Other Ambulatory Visit: Payer: Self-pay | Admitting: Internal Medicine

## 2017-05-27 DIAGNOSIS — Z794 Long term (current) use of insulin: Principal | ICD-10-CM

## 2017-05-27 DIAGNOSIS — E118 Type 2 diabetes mellitus with unspecified complications: Secondary | ICD-10-CM

## 2017-06-10 ENCOUNTER — Other Ambulatory Visit: Payer: Self-pay | Admitting: Internal Medicine

## 2017-06-10 DIAGNOSIS — E118 Type 2 diabetes mellitus with unspecified complications: Secondary | ICD-10-CM

## 2017-06-10 DIAGNOSIS — Z794 Long term (current) use of insulin: Principal | ICD-10-CM

## 2017-06-22 ENCOUNTER — Ambulatory Visit (INDEPENDENT_AMBULATORY_CARE_PROVIDER_SITE_OTHER): Payer: Medicaid Other | Admitting: *Deleted

## 2017-06-22 ENCOUNTER — Telehealth: Payer: Self-pay

## 2017-06-22 DIAGNOSIS — I428 Other cardiomyopathies: Secondary | ICD-10-CM

## 2017-06-22 DIAGNOSIS — I5022 Chronic systolic (congestive) heart failure: Secondary | ICD-10-CM

## 2017-06-22 DIAGNOSIS — Z95810 Presence of automatic (implantable) cardiac defibrillator: Secondary | ICD-10-CM | POA: Diagnosis not present

## 2017-06-22 NOTE — Progress Notes (Signed)
EPIC Encounter for ICM Monitoring  Patient Name: [name] is a 52 y.o. male Date: 06/22/2017 Primary Care Physican: [name], MD Primary Cardiologist:Crenshaw Electrophysiologist: Allred Dry Weight:263lbs       Heart Failure questions reviewed, pt asymptomatic.   Thoracic impedance normal.  Prescribed dosage: Furosemide 80 mg 1 tablet twice a day. [place] Con 20 mEq 1 tablet daily.   Recommendations: No changes.    Encouraged to call for fluid symptoms.  Follow-up plan: ICM clinic phone appointment on 07/23/2017.    Copy of ICM check sent to Dr. Rayann Heman.   3 month ICM trend: 07/23/2017    1 Year ICM trend:       [name], RN 06/22/2017 3:23 PM

## 2017-06-22 NOTE — Telephone Encounter (Signed)
Spoke with pt and reminded pt of remote transmission that is due today. Pt verbalized understanding.   

## 2017-06-24 LAB — CUP PACEART REMOTE DEVICE CHECK
Battery Remaining Longevity: 74 mo
Battery Voltage: 2.96 V
Date Time Interrogation Session: 20190114181027
HIGH POWER IMPEDANCE MEASURED VALUE: 79 Ohm
HIGH POWER IMPEDANCE MEASURED VALUE: 79 Ohm
Lead Channel Impedance Value: 360 Ohm
Lead Channel Sensing Intrinsic Amplitude: 11.8 mV
Lead Channel Setting Pacing Amplitude: 2.5 V
MDC IDC LEAD IMPLANT DT: 20150717
MDC IDC LEAD LOCATION: 753860
MDC IDC MSMT BATTERY REMAINING PERCENTAGE: 71 %
MDC IDC MSMT LEADCHNL RV PACING THRESHOLD AMPLITUDE: 0.75 V
MDC IDC MSMT LEADCHNL RV PACING THRESHOLD PULSEWIDTH: 0.5 ms
MDC IDC PG IMPLANT DT: 20150717
MDC IDC PG SERIAL: 7196009
MDC IDC SET LEADCHNL RV PACING PULSEWIDTH: 0.5 ms
MDC IDC SET LEADCHNL RV SENSING SENSITIVITY: 0.5 mV
MDC IDC STAT BRADY RV PERCENT PACED: 1 %

## 2017-06-24 NOTE — Progress Notes (Signed)
Remote ICD transmission.   

## 2017-06-26 ENCOUNTER — Encounter: Payer: Self-pay | Admitting: Cardiology

## 2017-07-23 ENCOUNTER — Telehealth: Payer: Self-pay | Admitting: Cardiology

## 2017-07-23 NOTE — Telephone Encounter (Signed)
Spoke with pt and reminded pt of remote transmission that is due today. Pt verbalized understanding.   

## 2017-07-24 ENCOUNTER — Ambulatory Visit: Payer: Medicaid Other | Attending: Internal Medicine | Admitting: Internal Medicine

## 2017-07-24 ENCOUNTER — Encounter: Payer: Self-pay | Admitting: Internal Medicine

## 2017-07-24 VITALS — BP 110/75 | HR 72 | Temp 98.2°F | Resp 16 | Wt 258.0 lb

## 2017-07-24 DIAGNOSIS — Z8249 Family history of ischemic heart disease and other diseases of the circulatory system: Secondary | ICD-10-CM | POA: Insufficient documentation

## 2017-07-24 DIAGNOSIS — Z6834 Body mass index (BMI) 34.0-34.9, adult: Secondary | ICD-10-CM | POA: Diagnosis not present

## 2017-07-24 DIAGNOSIS — K4090 Unilateral inguinal hernia, without obstruction or gangrene, not specified as recurrent: Secondary | ICD-10-CM | POA: Diagnosis not present

## 2017-07-24 DIAGNOSIS — E785 Hyperlipidemia, unspecified: Secondary | ICD-10-CM

## 2017-07-24 DIAGNOSIS — Z833 Family history of diabetes mellitus: Secondary | ICD-10-CM | POA: Insufficient documentation

## 2017-07-24 DIAGNOSIS — Z794 Long term (current) use of insulin: Secondary | ICD-10-CM

## 2017-07-24 DIAGNOSIS — Z9049 Acquired absence of other specified parts of digestive tract: Secondary | ICD-10-CM | POA: Insufficient documentation

## 2017-07-24 DIAGNOSIS — Z87891 Personal history of nicotine dependence: Secondary | ICD-10-CM | POA: Insufficient documentation

## 2017-07-24 DIAGNOSIS — E11649 Type 2 diabetes mellitus with hypoglycemia without coma: Secondary | ICD-10-CM | POA: Diagnosis present

## 2017-07-24 DIAGNOSIS — E669 Obesity, unspecified: Secondary | ICD-10-CM | POA: Diagnosis not present

## 2017-07-24 DIAGNOSIS — I5022 Chronic systolic (congestive) heart failure: Secondary | ICD-10-CM

## 2017-07-24 DIAGNOSIS — Z9889 Other specified postprocedural states: Secondary | ICD-10-CM | POA: Diagnosis not present

## 2017-07-24 DIAGNOSIS — F419 Anxiety disorder, unspecified: Secondary | ICD-10-CM | POA: Insufficient documentation

## 2017-07-24 DIAGNOSIS — Z79899 Other long term (current) drug therapy: Secondary | ICD-10-CM | POA: Diagnosis not present

## 2017-07-24 DIAGNOSIS — E118 Type 2 diabetes mellitus with unspecified complications: Secondary | ICD-10-CM | POA: Diagnosis not present

## 2017-07-24 DIAGNOSIS — Z7982 Long term (current) use of aspirin: Secondary | ICD-10-CM | POA: Insufficient documentation

## 2017-07-24 LAB — GLUCOSE, POCT (MANUAL RESULT ENTRY): POC GLUCOSE: 141 mg/dL — AB (ref 70–99)

## 2017-07-24 NOTE — Progress Notes (Signed)
Patient ID: [name], male    DOB: 1965/08/15  MRN: [id number]  CC: Diabetes   Subjective: [name] is a 52 y.o. male who presents for chronic ds management His concerns today include:  Patient with history of ICM, systolic CHF status post ICD placement, diabetes type 2, obesity, BPH, and chronic lower back pain secondary to spondylosis and spinal stenosis.   1.  DM:  A1c on last visit was 9.9.  I had recommended adding another medication to metformin and Januvia but patient declined stating that he wanted another 3 months to get his blood sugars better. Blood sugars: Checks 3-4 x a wk.  Range 130-160.  Reports hypoglycemic symptoms if blood sugars get in the low 100s Meds: compliant with Metformin and Januvia Eating habits: He feels he has improved Exercise: Walks 3 miles/day Intermittent numbness in feet  2.  ICM/CHF: +SOB always.  However on further questioning he denies shortness of breath when he is walking.  Denies any PND orthopnea.  No lower extremity edema. -Compliant with carvedilol, furosemide, and Entresto -Weight is down 5 pounds from last visit.  He limits salt in the foods.  3.  Reports that he has a hernia in the left inguinal region.  It is not bothersome to him but he wanted to know if anything needs to be done.  4.  I went over the blood test results from last visit with him.  Lipids were not at goal.  I recommended starting Lipitor.  He has started taking and tolerating okay. He has some mild stable anemia. Kidney function a little better compared to previous.  Patient Active Problem List   Diagnosis Date Noted  . 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  . Hypersomnia 02/28/2016  . Abnormal PFT 02/24/2016  . Diabetic polyneuropathy associated with type 2 diabetes mellitus ([place]) 12/18/2015  . Lumbar back pain 12/18/2015  . Controlled type 2 diabetes mellitus with complication, with long-term current use of insulin ([place])  09/17/2015  . 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 Lesion of right median nerve at forearm 04/16/2015  . ICD (implantable cardioverter-defibrillator) in place 03/28/2014  . Chronic systolic congestive heart failure ([place]) 03/28/2014  . Ventricular tachycardia, non-sustained ([place]) 03/27/2014  . Nonischemic cardiomyopathy ([place]) 11/09/2013  . Chronic systolic dysfunction of left ventricle 11/09/2013  . Syncope 10/20/2013  . Chronic chest pain 07/28/2013  . 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 RENAL CALCULUS 05/13/2009  . Obesity 05/08/2009  . Former heavy cigarette smoker (20-39 per day) 05/08/2009  . HOARSENESS 05/08/2009  . ANXIETY 02/28/2008  . GERD 02/28/2008  . IRRITABLE BOWEL SYNDROME 02/28/2008     Current Outpatient Medications on File Prior to Visit  Medication Sig Dispense Refill  . aspirin 81 MG tablet Take 81 mg by mouth daily.    Marland Kitchen atorvastatin (LIPITOR) 10 MG tablet Take 1 tablet (10 mg total) daily by mouth. 30 tablet 11  . carvedilol (COREG) 25 MG tablet TAKE ONE TABLET BY MOUTH TWICE DAILY 180 tablet 3  . cyclobenzaprine (FLEXERIL) 5 MG tablet TAKE 1 TABLET BY MOUTH THREE TIMES DAILY AS NEEDED FOR MUSCLE SPASM 60 tablet 0  . furosemide (LASIX) 80 MG tablet TAKE 1 TABLET BY MOUTH TWICE A DAY 180 tablet 1  . JANUVIA 100 MG tablet TAKE ONE TABLET BY MOUTH ONCE DAILY 90 tablet 3  . KLOR-CON M20 20 MEQ tablet TAKE ONE TABLET BY MOUTH ONCE DAILY 90 tablet 3  . metFORMIN (GLUCOPHAGE)  1000 MG tablet TAKE 1 TABLET BY MOUTH ONCE DAILY IN THE MORNING AND 1 & 1/2 (ONE & ONE-HALF) ONCE DAILY IN THE EVENING 75 tablet 2  . pantoprazole (PROTONIX) 40 MG tablet TAKE 1 TABLET BY MOUTH ONCE DAILY 90 tablet 0  . sacubitril-valsartan (ENTRESTO) 24-26 MG Take 1 tablet by mouth 2 (two) times daily. 180 tablet 3  . tamsulosin (FLOMAX) 0.4 MG CAPS capsule Take by mouth 1 capsule 1 hr after breakfast for two weeks, then increase to 0.8 mg daily 60 capsule 3  . ACCU-CHEK AVIVA  PLUS test strip USE ONE STRIP THREE TIMES DAILY (Patient not taking: Reported on 07/24/2017) 100 each 12  . Blood Glucose Monitoring Suppl (ACCU-CHEK AVIVA PLUS) W/DEVICE KIT 1 Device by Does not apply route 3 (three) times daily after meals. (Patient not taking: Reported on 07/24/2017) 1 kit 0  . Elastic Bandages & Supports (LUMBAR BACK BRACE/SUPPORT PAD) MISC 1 each by Does not apply route daily. ICD 10 M54.5 (Patient not taking: Reported on 07/24/2017) 1 each 0  . gabapentin (NEURONTIN) 300 MG capsule 300 mg in the morning, 300 mg in afternoon and 600 mg in evening 120 capsule 3  . gabapentin (NEURONTIN) 300 MG capsule TAKE ONE CAPSULE BY MOUTH THREE TIMES DAILY 90 capsule 5  . Lancet Devices (ACCU-CHEK SOFTCLIX) lancets 1 each by Other route 3 (three) times daily. (Patient not taking: Reported on 07/24/2017) 1 each 0  . levalbuterol (XOPENEX HFA) 45 MCG/ACT inhaler Inhale 1-2 puffs into the lungs every 6 (six) hours as needed for wheezing. (Patient not taking: Reported on 07/24/2017) 1 Inhaler 12   No current facility-administered medications on file prior to visit.     Allergies  Allergen Reactions  . Bee Venom Anaphylaxis    Social History   Socioeconomic History  . Marital status: Single    Spouse name: Not on file  . Number of children: Not on file  . Years of education: Not on file  . Highest education level: Not on file  Social Needs  . Financial resource strain: Not on file  . Food insecurity - worry: Not on file  . Food insecurity - inability: Not on file  . Transportation needs - medical: Not on file  . Transportation needs - non-medical: Not on file  Occupational History  . Not on file  Tobacco Use  . Smoking status: Former Smoker    Packs/day: 1.00    Years: 28.00    Pack years: 28.00    Types: Cigarettes    Last attempt to quit: 06/03/2014    Years since quitting: 3.1  . Smokeless tobacco: Never Used  Substance and Sexual Activity  . Alcohol use: Yes     Alcohol/week: 0.0 oz    Comment: 12/23/2013 "might drink a couple times/yr"  . Drug use: Yes    Types: Marijuana  . Sexual activity: Yes  Other Topics Concern  . Not on file  Social History Narrative  . Not on file    Family History  Problem Relation Age of Onset  . Diabetes Father   . Arthritis Father   . Diabetes Mother   . Arthritis Mother   . Hypertension Brother     Past Surgical History:  Procedure Laterality Date  . CARDIAC CATHETERIZATION  08/10/13  . CHOLECYSTECTOMY  ~ 2010  . IMPLANTABLE CARDIOVERTER DEFIBRILLATOR IMPLANT  12/23/2013   STJ Fortify ICD implanted by [name] for cardiomyopathy and syncope  . IMPLANTABLE CARDIOVERTER DEFIBRILLATOR IMPLANT N/A 12/23/2013  Procedure: IMPLANTABLE CARDIOVERTER DEFIBRILLATOR IMPLANT;  Surgeon: Coralyn Mark, MD;  Location: [facility] CATH LAB;  Service: Cardiovascular;  Laterality: N/A;  . LEFT HEART CATHETERIZATION WITH CORONARY ANGIOGRAM N/A 08/10/2013   Procedure: LEFT HEART CATHETERIZATION WITH CORONARY ANGIOGRAM;  Surgeon: Jettie Booze, MD;  Location[facility] CATH [facility];  Service: Cardiovascular;  Laterality: N/A;  . ORIF HUMERUS FRACTURE Left 08/12/2013   Procedure: OPEN REDUCTION INTERNAL FIXATION (ORIF) HUMERAL SHAFT FRACTURE;  Surgeon: Newt Minion, MD;  Location: [place];  Service: Orthopedics;  Laterality: Left;  Open Reduction Internal Fixation Left Humerus  . URETEROSCOPY     "laser for kidney stones"    ROS: Review of Systems Negative except as stated above PHYSICAL EXAM: BP 110/75   Pulse 72   Temp 98.2 F (36.8 C) (Oral)   Resp 16   Wt 258 lb (117 kg)   SpO2 98%   BMI 34.04 kg/m   Wt Readings from Last 3 Encounters:  07/24/17 258 lb (117 kg)  04/17/17 263 lb 3.2 oz (119.4 kg)  12/12/16 259 lb 9.6 oz (117.8 kg)   Physical Exam General appearance - alert, well appearing, middle-aged African-American male and in no distress.   Mental status - alert, oriented to person, place, and time, normal mood, behavior,  speech, dress, motor activity, and thought processes Mouth - mucous membranes moist, pharynx normal without lesions Chest - clear to auscultation, no wheezes, rales or rhonchi, symmetric air entry Heart - normal rate, regular rhythm, normal S1, S2, no murmurs, rubs, clicks or gallops Extremities -no lower extremity edema Patient declined examination of the left inguinal region  Results for orders placed or performed in visit on 07/24/17  POCT glucose (manual entry)  Result Value Ref Range   POC Glucose 141 (A) 70 - 99 mg/dl   Lab Results  Component Value Date   CHOL 204 (H) 04/17/2017   HDL 37 (L) 04/17/2017   LDLCALC 103 (H) 04/17/2017   TRIG 321 (H) 04/17/2017   CHOLHDL 5.5 (H) 04/17/2017   Lab Results  Component Value Date   WBC 5.4 04/17/2017   HGB 12.0 (L) 04/17/2017   HCT 35.6 (L) 04/17/2017   MCV 90 04/17/2017   PLT 341 04/17/2017     Chemistry      Component Value Date/Time   NA 141 04/17/2017 1525   K 4.1 04/17/2017 1525   CL 100 04/17/2017 1525   CO2 22 04/17/2017 1525   BUN 14 04/17/2017 1525   CREATININE 1.32 (H) 04/17/2017 1525   CREATININE 1.68 (H) 04/24/2016 1502      Component Value Date/Time   CALCIUM 9.4 04/17/2017 1525   ALKPHOS 53 04/17/2017 1525   AST 15 04/17/2017 1525   ALT 18 04/17/2017 1525   BILITOT 0.3 04/17/2017 1525        ASSESSMENT AND PLAN: 1. Controlled type 2 diabetes mellitus with complication, without long-term current use of insulin (HCC) -We are out of A1c strips.  Patient declined having blood drawn for A1c today.  We will plan to check A1c on next visit. -Continue metformin and Januvia. Continue regular exercise and healthy eating habits. - POCT glucose (manual entry)   2. Chronic systolic congestive heart failure (HCC) Compensated.  Continue carvedilol, Entresto, furosemide and potassium  3. Hyperlipidemia, unspecified hyperlipidemia type Continue Lipitor. 4. Unilateral inguinal hernia without obstruction or  gangrene, recurrence not specified By history patient reports having hernia in the left inguinal region.  He declines exam of that area today.  I went over signs and symptoms that would suggest a strangulated hernia and printed information given to the patient.   Patient was given the opportunity to ask questions.  Patient verbalized understanding of the plan and was able to repeat key elements of the plan.   Orders Placed This Encounter  Procedures  . Hemoglobin A1c  . POCT glucose (manual entry)     Requested Prescriptions    No prescriptions requested or ordered in this encounter    Return in about 4 months (around 11/21/2017).  Karle Plumber, MD, FACP

## 2017-07-24 NOTE — Patient Instructions (Signed)
Inguinal Hernia, Adult An inguinal hernia is when fat or the intestines push through the area where the leg meets the lower belly (groin) and make a rounded lump (bulge). This condition happens over time. There are three types of inguinal hernias. These types include:  Hernias that can be pushed back into the belly (are reducible).  Hernias that cannot be pushed back into the belly (are incarcerated).  Hernias that cannot be pushed back into the belly and lose their blood supply (get strangulated). This type needs emergency surgery.  Follow these instructions at home: Lifestyle  Drink enough fluid to keep your urine (pee) clear or pale yellow.  Eat plenty of fruits, vegetables, and whole grains. These have a lot of fiber. Talk with your doctor if you have questions.  Avoid lifting heavy objects.  Avoid standing for long periods of time.  Do not use tobacco products. These include cigarettes, chewing tobacco, or e-cigarettes. If you need help quitting, ask your doctor.  Try to stay at a healthy weight. General instructions  Do not try to force the hernia back in.  Watch your hernia for any changes in color or size. Let your doctor know if there are any changes.  Take over-the-counter and prescription medicines only as told by your doctor.  Keep all follow-up visits as told by your doctor. This is important. Contact a doctor if:  You have a fever.  You have new symptoms.  Your symptoms get worse. Get help right away if:  The area where the legs meets the lower belly has: ? Pain that gets worse suddenly. ? A bulge that gets bigger suddenly and does not go down. ? A bulge that turns red or purple. ? A bulge that is painful to the touch.  You are a man and your scrotum: ? Suddenly feels painful. ? Suddenly changes in size.  You feel sick to your stomach (nauseous) and this feeling does not go away.  You throw up (vomit) and this keeps happening.  You feel your heart  beating a lot more quickly than normal.  You cannot poop (have a bowel movement) or pass gas. This information is not intended to replace advice given to you by your health care provider. Make sure you discuss any questions you have with your health care provider. Document Released: 06/26/2006 Document Revised: 11/01/2015 Document Reviewed: 04/05/2014 Elsevier Interactive Patient Education  2018 [employer]  

## 2017-07-25 LAB — HEMOGLOBIN A1C
Est. average glucose Bld gHb Est-mCnc: 197 mg/dL
Hgb A1c MFr Bld: 8.5 % — ABNORMAL HIGH (ref 4.8–5.6)

## 2017-07-26 ENCOUNTER — Encounter: Payer: Self-pay | Admitting: Internal Medicine

## 2017-07-26 NOTE — Progress Notes (Signed)
Message sent to [place] regarding labs: Let pt know his A1C is 8.5, down from 9.9 in November.  Goal is to get him less than 7.  Would we can add another medication called Amaryl or he can continue to work on improving eating habits.  Please let me know.

## 2017-07-27 ENCOUNTER — Telehealth: Payer: Self-pay

## 2017-07-27 NOTE — Telephone Encounter (Signed)
Contacted pt to go over lab results pt didn't answer lvm asking pt to give me a call at his earliest convenience  If pt calls back please give results: A1C is 8.5, down from 9.9 in November. Goal is to get him less than 7. We can add another medication called Amaryl or he can continue to work on improving eating habits. Please let me know.

## 2017-07-29 ENCOUNTER — Telehealth: Payer: Self-pay | Admitting: Internal Medicine

## 2017-07-29 NOTE — Telephone Encounter (Signed)
Patient called I gave him his results and he said he would like to change his eating habit

## 2017-08-04 NOTE — Progress Notes (Signed)
No ICM remote transmission received for 07/23/2017 and next ICM transmission scheduled for 08/13/2017.

## 2017-08-13 ENCOUNTER — Ambulatory Visit (INDEPENDENT_AMBULATORY_CARE_PROVIDER_SITE_OTHER): Payer: Medicaid Other

## 2017-08-13 ENCOUNTER — Telehealth: Payer: Self-pay

## 2017-08-13 ENCOUNTER — Other Ambulatory Visit: Payer: Self-pay | Admitting: Cardiology

## 2017-08-13 ENCOUNTER — Other Ambulatory Visit: Payer: Self-pay | Admitting: Internal Medicine

## 2017-08-13 DIAGNOSIS — K219 Gastro-esophageal reflux disease without esophagitis: Secondary | ICD-10-CM

## 2017-08-13 DIAGNOSIS — I5022 Chronic systolic (congestive) heart failure: Secondary | ICD-10-CM

## 2017-08-13 DIAGNOSIS — Z95810 Presence of automatic (implantable) cardiac defibrillator: Secondary | ICD-10-CM

## 2017-08-13 DIAGNOSIS — I5043 Acute on chronic combined systolic (congestive) and diastolic (congestive) heart failure: Secondary | ICD-10-CM

## 2017-08-13 NOTE — Telephone Encounter (Signed)
REFILL 

## 2017-08-13 NOTE — Telephone Encounter (Signed)
LMOVM requesting that pt send manual transmission 

## 2017-08-14 NOTE — Progress Notes (Signed)
EPIC Encounter for ICM Monitoring  Patient Name: [name] is a 52 y.o. male Date: 08/14/2017 Primary Care Physican: [name], MD Primary Cardiologist:[place] Electrophysiologist: Allred Dry Weight:242lbs      Heart Failure questions reviewed, pt asymptomatic.   Thoracic impedance normal.  Prescribed dosage: Furosemide 80 mg 1 tablet twice a day. K[place] Con 20 mEq 1 tablet daily.   Recommendations: No changes.   Encouraged to call for fluid symptoms.  Follow-up plan: ICM clinic phone appointment on 09/21/2017.    Copy of ICM check sent to Dr. Rayann Heman.   3 month ICM trend: 08/14/2017    1 Year ICM trend:       [name], RN 08/14/2017 1:39 PM

## 2017-08-15 ENCOUNTER — Other Ambulatory Visit: Payer: Self-pay | Admitting: Cardiology

## 2017-08-15 DIAGNOSIS — I519 Heart disease, unspecified: Secondary | ICD-10-CM

## 2017-08-15 DIAGNOSIS — I428 Other cardiomyopathies: Secondary | ICD-10-CM

## 2017-08-17 NOTE — Telephone Encounter (Signed)
REFILL 

## 2017-08-24 ENCOUNTER — Other Ambulatory Visit: Payer: Self-pay | Admitting: Pharmacist

## 2017-08-24 ENCOUNTER — Telehealth: Payer: Self-pay | Admitting: Cardiology

## 2017-08-24 DIAGNOSIS — N401 Enlarged prostate with lower urinary tract symptoms: Principal | ICD-10-CM

## 2017-08-24 DIAGNOSIS — N138 Other obstructive and reflux uropathy: Secondary | ICD-10-CM

## 2017-08-24 MED ORDER — TAMSULOSIN HCL 0.4 MG PO CAPS: ORAL_CAPSULE | ORAL | 2 refills | Status: DC

## 2017-08-24 NOTE — Telephone Encounter (Signed)
New message  Pt c/o medication issue:  1. Name of Medication: sacubitril-valsartan (ENTRESTO) 24-26 MG  2. How are you currently taking this medication (dosage and times per day)? Take 1 tablet by mouth 2 (two) times daily.  3. Are you having a reaction (difficulty breathing--STAT)? NO  4. What is your medication issue?  Per patient pharmacy states authorization needed

## 2017-08-24 NOTE — Telephone Encounter (Signed)
PA intiated through [facility]  PA# [id number]  Interaction ID# [id number]  Patient aware and verbalized understanding.   Also aware he is due for appt with [name].   He request to call back when he has his calendar.

## 2017-09-17 ENCOUNTER — Emergency Department (HOSPITAL_BASED_OUTPATIENT_CLINIC_OR_DEPARTMENT_OTHER): Payer: Medicaid Other

## 2017-09-17 ENCOUNTER — Encounter (HOSPITAL_BASED_OUTPATIENT_CLINIC_OR_DEPARTMENT_OTHER): Payer: Self-pay

## 2017-09-17 ENCOUNTER — Other Ambulatory Visit: Payer: Self-pay

## 2017-09-17 ENCOUNTER — Observation Stay (HOSPITAL_BASED_OUTPATIENT_CLINIC_OR_DEPARTMENT_OTHER)
Admission: EM | Admit: 2017-09-17 | Discharge: 2017-09-20 | Disposition: A | Discharge status: Home / Self Care | Payer: Medicaid Other | Attending: Internal Medicine | Admitting: Internal Medicine

## 2017-09-17 DIAGNOSIS — I255 Ischemic cardiomyopathy: Secondary | ICD-10-CM | POA: Diagnosis not present

## 2017-09-17 DIAGNOSIS — N401 Enlarged prostate with lower urinary tract symptoms: Secondary | ICD-10-CM | POA: Insufficient documentation

## 2017-09-17 DIAGNOSIS — Z95810 Presence of automatic (implantable) cardiac defibrillator: Secondary | ICD-10-CM | POA: Diagnosis not present

## 2017-09-17 DIAGNOSIS — L02214 Cutaneous abscess of groin: Principal | ICD-10-CM | POA: Insufficient documentation

## 2017-09-17 DIAGNOSIS — L03314 Cellulitis of groin: Secondary | ICD-10-CM | POA: Insufficient documentation

## 2017-09-17 DIAGNOSIS — I130 Hypertensive heart and chronic kidney disease with heart failure and stage 1 through stage 4 chronic kidney disease, or unspecified chronic kidney disease: Secondary | ICD-10-CM | POA: Diagnosis not present

## 2017-09-17 DIAGNOSIS — Z79899 Other long term (current) drug therapy: Secondary | ICD-10-CM | POA: Diagnosis not present

## 2017-09-17 DIAGNOSIS — E1142 Type 2 diabetes mellitus with diabetic polyneuropathy: Secondary | ICD-10-CM | POA: Diagnosis not present

## 2017-09-17 DIAGNOSIS — N183 Chronic kidney disease, stage 3 (moderate): Secondary | ICD-10-CM | POA: Diagnosis not present

## 2017-09-17 DIAGNOSIS — J029 Acute pharyngitis, unspecified: Secondary | ICD-10-CM | POA: Diagnosis not present

## 2017-09-17 DIAGNOSIS — Z87891 Personal history of nicotine dependence: Secondary | ICD-10-CM | POA: Insufficient documentation

## 2017-09-17 DIAGNOSIS — N138 Other obstructive and reflux uropathy: Secondary | ICD-10-CM | POA: Insufficient documentation

## 2017-09-17 DIAGNOSIS — Z6833 Body mass index (BMI) 33.0-33.9, adult: Secondary | ICD-10-CM | POA: Insufficient documentation

## 2017-09-17 DIAGNOSIS — Z7984 Long term (current) use of oral hypoglycemic drugs: Secondary | ICD-10-CM | POA: Insufficient documentation

## 2017-09-17 DIAGNOSIS — Z7982 Long term (current) use of aspirin: Secondary | ICD-10-CM | POA: Diagnosis not present

## 2017-09-17 DIAGNOSIS — N400 Enlarged prostate without lower urinary tract symptoms: Secondary | ICD-10-CM | POA: Insufficient documentation

## 2017-09-17 DIAGNOSIS — R509 Fever, unspecified: Secondary | ICD-10-CM

## 2017-09-17 DIAGNOSIS — L0291 Cutaneous abscess, unspecified: Secondary | ICD-10-CM | POA: Diagnosis present

## 2017-09-17 DIAGNOSIS — E669 Obesity, unspecified: Secondary | ICD-10-CM | POA: Insufficient documentation

## 2017-09-17 DIAGNOSIS — I509 Heart failure, unspecified: Secondary | ICD-10-CM | POA: Diagnosis not present

## 2017-09-17 DIAGNOSIS — D631 Anemia in chronic kidney disease: Secondary | ICD-10-CM | POA: Insufficient documentation

## 2017-09-17 DIAGNOSIS — K219 Gastro-esophageal reflux disease without esophagitis: Secondary | ICD-10-CM | POA: Insufficient documentation

## 2017-09-17 DIAGNOSIS — E1122 Type 2 diabetes mellitus with diabetic chronic kidney disease: Secondary | ICD-10-CM | POA: Insufficient documentation

## 2017-09-17 DIAGNOSIS — L0390 Cellulitis, unspecified: Secondary | ICD-10-CM | POA: Diagnosis present

## 2017-09-17 DIAGNOSIS — R5383 Other fatigue: Secondary | ICD-10-CM | POA: Diagnosis not present

## 2017-09-17 DIAGNOSIS — I5022 Chronic systolic (congestive) heart failure: Secondary | ICD-10-CM | POA: Insufficient documentation

## 2017-09-17 DIAGNOSIS — N492 Inflammatory disorders of scrotum: Secondary | ICD-10-CM | POA: Diagnosis not present

## 2017-09-17 LAB — I-STAT VENOUS BLOOD GAS, ED
Acid-Base Excess: 4 mmol/L — ABNORMAL HIGH (ref 0.0–2.0)
BICARBONATE: 27.6 mmol/L (ref 20.0–28.0)
O2 SAT: 93 %
TCO2: 29 mmol/L (ref 22–32)
pCO2, Ven: 41.6 mmHg — ABNORMAL LOW (ref 44.0–60.0)
pH, Ven: 7.436 — ABNORMAL HIGH (ref 7.250–7.430)
pO2, Ven: 72 mmHg — ABNORMAL HIGH (ref 32.0–45.0)

## 2017-09-17 LAB — URINALYSIS, ROUTINE W REFLEX MICROSCOPIC
Bilirubin Urine: NEGATIVE
Glucose, UA: NEGATIVE mg/dL
HGB URINE DIPSTICK: NEGATIVE
KETONES UR: NEGATIVE mg/dL
LEUKOCYTES UA: NEGATIVE
Nitrite: NEGATIVE
PROTEIN: NEGATIVE mg/dL
Specific Gravity, Urine: 1.01 (ref 1.005–1.030)
pH: 5.5 (ref 5.0–8.0)

## 2017-09-17 LAB — BASIC METABOLIC PANEL
ANION GAP: 11 (ref 5–15)
BUN: 20 mg/dL (ref 6–20)
CALCIUM: 8.9 mg/dL (ref 8.9–10.3)
CO2: 23 mmol/L (ref 22–32)
Chloride: 103 mmol/L (ref 101–111)
Creatinine, Ser: 1.55 mg/dL — ABNORMAL HIGH (ref 0.61–1.24)
GFR calc Af Amer: 58 mL/min — ABNORMAL LOW (ref >60)
GFR, EST NON AFRICAN AMERICAN: 50 mL/min — AB (ref >60)
Glucose, Bld: 128 mg/dL — ABNORMAL HIGH (ref 65–99)
POTASSIUM: 3.6 mmol/L (ref 3.5–5.1)
SODIUM: 137 mmol/L (ref 135–145)

## 2017-09-17 LAB — CBC WITH DIFFERENTIAL/PLATELET
BASOS ABS: 0 10*3/uL (ref 0.0–0.1)
Basophils Relative: 0 %
EOS ABS: 0.1 10*3/uL (ref 0.0–0.7)
EOS PCT: 1 %
HCT: 32.9 % — ABNORMAL LOW (ref 39.0–52.0)
Hemoglobin: 10.7 g/dL — ABNORMAL LOW (ref 13.0–17.0)
Lymphocytes Relative: 27 %
Lymphs Abs: 2.4 10*3/uL (ref 0.7–4.0)
MCH: 29.5 pg (ref 26.0–34.0)
MCHC: 32.5 g/dL (ref 30.0–36.0)
MCV: 90.6 fL (ref 78.0–100.0)
MONO ABS: 0.9 10*3/uL (ref 0.1–1.0)
Monocytes Relative: 10 %
Neutro Abs: 5.5 10*3/uL (ref 1.7–7.7)
Neutrophils Relative %: 62 %
PLATELETS: 277 10*3/uL (ref 150–400)
RBC: 3.63 MIL/uL — AB (ref 4.22–5.81)
RDW: 13.9 % (ref 11.5–15.5)
WBC: 8.9 10*3/uL (ref 4.0–10.5)

## 2017-09-17 LAB — RAPID STREP SCREEN (MED CTR MEBANE ONLY): Streptococcus, Group A Screen (Direct): NEGATIVE

## 2017-09-17 LAB — I-STAT CG4 LACTIC ACID, ED: LACTIC ACID, VENOUS: 1.03 mmol/L (ref 0.5–1.9)

## 2017-09-17 MED ORDER — LIDOCAINE-EPINEPHRINE 2 %-1:100000 IJ SOLN
20.0000 mL | Freq: Once | INTRAMUSCULAR | Status: AC
  Administered 2017-09-17: 20 mL via INTRADERMAL
  Filled 2017-09-17: qty 1

## 2017-09-17 MED ORDER — IOPAMIDOL (ISOVUE-300) INJECTION 61%
100.0000 mL | Freq: Once | INTRAVENOUS | Status: AC | PRN
  Administered 2017-09-17: 100 mL via INTRAVENOUS

## 2017-09-17 MED ORDER — ACETAMINOPHEN 500 MG PO TABS
1000.0000 mg | ORAL_TABLET | Freq: Once | ORAL | Status: AC
  Administered 2017-09-17: 1000 mg via ORAL
  Filled 2017-09-17: qty 2

## 2017-09-17 NOTE — ED Provider Notes (Signed)
[facility] EMERGENCY DEPARTMENT Provider Note  CSN: [id number] Arrival date & time: 09/17/17 1540  Chief Complaint(s) Abscess  HPI [name] is a 52 y.o. male   The history is provided by the patient.  Abscess  Abscess location: left groin. Abscess quality: draining, painful and redness   Red streaking: yes   Duration:  2 days Progression:  Unchanged Pain details:    Quality:  Pressure and throbbing   Progression:  Waxing and waning Chronicity:  New Context: diabetes   Relieved by:  None tried Worsened by:  Draining/squeezing Associated symptoms: fatigue   Associated symptoms comment:  Reports not feeling well  Pt also reports 2 days of sore throat w/o rhinorrhea, nasal congestion, cough.  No known sick contacts.  No nausea or vomiting.  No abdominal pain.  No diarrhea.  No urinary symptoms.  Reports chronic chest pain that is unchanged.  Past Medical History Past Medical History:  Diagnosis Date  . Cardiomyopathy ([place])    a. 07/2013 Echo: EF 20-25%, diff HK, Gr2 DD, mild MR, mod dil LA/RA.  Marland Kitchen CHF (congestive heart failure) ([place])   . Chronic bronchitis ([place])    "get it most q yr" (12/23/2013)  . Chronic systolic dysfunction of left ventricle   . Fracture of left humerus    a. 07/2013.  Marland Kitchen GERD (gastroesophageal reflux disease)    not at present time  . Heart murmur    "born w/it"   . History of renal calculi   . Hypertension   . Kidney stones   . Obesity   . 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 Syncope   . Type II diabetes mellitus ([place]) 2006   Patient Active Problem List   Diagnosis Date Noted  . Unilateral inguinal hernia without obstruction or gangrene 07/24/2017  . 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  . Hypersomnia 02/28/2016  . Abnormal PFT 02/24/2016  . Diabetic polyneuropathy associated  with type 2 diabetes mellitus ([place]) 12/18/2015  . Lumbar back pain 12/18/2015  . Controlled type 2 diabetes mellitus with complication, with long-term current use of insulin ([place]) 09/17/2015  . 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 Lesion of right median nerve at forearm 04/16/2015  . ICD (implantable cardioverter-defibrillator) in place 03/28/2014  . Chronic systolic congestive heart failure ([place]) 03/28/2014  . Ventricular tachycardia, non-sustained ([place]) 03/27/2014  . Nonischemic cardiomyopathy ([place]) 11/09/2013  . Chronic systolic dysfunction of left ventricle 11/09/2013  . Syncope 10/20/2013  . Chronic chest pain 07/28/2013  . 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 RENAL CALCULUS 05/13/2009  . Obesity 05/08/2009  . Former heavy cigarette smoker (20-39 per day) 05/08/2009  . HOARSENESS 05/08/2009  . ANXIETY 02/28/2008  . GERD 02/28/2008  . IRRITABLE BOWEL SYNDROME 02/28/2008   Home Medication(s) Prior to Admission medications   Medication Sig Start Date End Date Taking? Authorizing Provider  ACCU-CHEK AVIVA PLUS test strip USE ONE STRIP THREE TIMES DAILY 11/06/15  Yes Funches, Josalyn, MD  aspirin 81 MG tablet Take 81 mg by mouth daily.   Yes [provider]  atorvastatin (LIPITOR) 10 MG tablet Take 1 tablet (10 mg total) daily by mouth. 04/18/17  Yes [name], MD  Blood Glucose Monitoring Suppl (ACCU-CHEK AVIVA PLUS) W/DEVICE KIT 1 Device by Does not apply route 3 (three) times daily after meals. 10/20/14  Yes Boykin Nearing, MD  carvedilol (COREG) 25 MG tablet TAKE ONE TABLET BY MOUTH TWICE DAILY 03/30/17  Yes Lelon Perla, MD  furosemide (LASIX) 80 MG tablet TAKE 1 TABLET BY MOUTH TWICE DAILY 08/13/17  Yes Lelon Perla, MD  gabapentin (NEURONTIN) 300 MG capsule 300 mg in the morning, 300 mg in afternoon and 600 mg in evening 12/17/15  Yes Funches, Josalyn, MD  gabapentin (NEURONTIN) 300 MG capsule TAKE ONE CAPSULE BY MOUTH THREE TIMES  DAILY 01/05/17  Yes Regal, Tamala Fothergill, DPM  JANUVIA 100 MG tablet TAKE ONE TABLET BY MOUTH ONCE DAILY 09/29/16  Yes Funches, Josalyn, MD  KLOR-CON M20 20 MEQ tablet TAKE 1 TABLET BY MOUTH ONCE DAILY 08/13/17  Yes Lelon Perla, MD  Lancet Devices [facility]) lancets 1 each by Other route 3 (three) times daily. 10/20/14  Yes Funches, Josalyn, MD  metFORMIN (GLUCOPHAGE) 1000 MG tablet TAKE 1 TABLET BY MOUTH ONCE DAILY IN THE MORNING AND 1 & 1/2 (ONE & ONE-HALF) ONCE DAILY IN THE EVENING 05/27/17  Yes [name], MD  pantoprazole (PROTONIX) 40 MG tablet TAKE 1 TABLET BY MOUTH ONCE DAILY 08/13/17  Yes [name], MD  sacubitril-valsartan (ENTRESTO) 24-26 MG Take 1 tablet by mouth 2 (two) times daily. KEEP OV. 08/17/17  Yes Lelon Perla, MD  tamsulosin (FLOMAX) 0.4 MG CAPS capsule Take by mouth 1 capsule 1 hr after breakfast for two weeks, then increase to 0.8 mg daily 08/24/17  Yes [name], MD  cyclobenzaprine (FLEXERIL) 5 MG tablet TAKE 1 TABLET BY MOUTH THREE TIMES DAILY AS NEEDED FOR MUSCLE SPASM 03/13/17   Tresa Garter, MD  Elastic Bandages & Supports (LUMBAR BACK BRACE/SUPPORT PAD) MISC 1 each by Does not apply route daily. ICD 10 M54.5 Patient not taking: Reported on 07/24/2017 07/21/16   Boykin Nearing, MD  levalbuterol [facility] HFA) 45 MCG/ACT inhaler Inhale 1-2 puffs into the lungs every 6 (six) hours as needed for wheezing. Patient not taking: Reported on 07/24/2017 02/28/16   de Dios, Mike Gip, MD                                                                                                                                    Past Surgical History Past Surgical History:  Procedure Laterality Date  . CARDIAC CATHETERIZATION  08/10/13  . CHOLECYSTECTOMY  ~ 2010  . IMPLANTABLE CARDIOVERTER DEFIBRILLATOR IMPLANT  12/23/2013   STJ Fortify ICD implanted by [name] for cardiomyopathy and syncope  . IMPLANTABLE CARDIOVERTER DEFIBRILLATOR IMPLANT N/A  12/23/2013   Procedure: IMPLANTABLE CARDIOVERTER DEFIBRILLATOR IMPLANT;  Surgeon: Coralyn Mark, MD;  Location: [facility] CATH LAB;  Service: Cardiovascular;  Laterality: N/A;  . LEFT HEART CATHETERIZATION WITH CORONARY ANGIOGRAM N/A 08/10/2013   Procedure: LEFT HEART CATHETERIZATION WITH CORONARY ANGIOGRAM;  Surgeon: Jettie Booze, MD;  Location: [facility] CATH LAB;  Service: Cardiovascular;  Laterality: N/A;  . ORIF HUMERUS FRACTURE Left  08/12/2013   Procedure: OPEN REDUCTION INTERNAL FIXATION (ORIF) HUMERAL SHAFT FRACTURE;  Surgeon: Newt Minion, MD;  Location: [employer] [facility];  Service: Orthopedics;  Laterality: Left;  Open Reduction Internal Fixation Left Humerus  . URETEROSCOPY     "laser for kidney stones"   Family History Family History  Problem Relation Age of Onset  . Diabetes Father   . Arthritis Father   . Diabetes Mother   . Arthritis Mother   . Hypertension Brother     Social History Social History   Tobacco Use  . Smoking status: Former Smoker    Packs/day: 1.00    Years: 28.00    Pack years: 28.00    Types: Cigarettes    Last attempt to quit: 06/03/2014    Years since quitting: 3.2  . Smokeless tobacco: Never Used  Substance Use Topics  . Alcohol use: Yes    Alcohol/week: 0.0 oz    Comment: 12/23/2013 "might drink a couple times/yr"  . Drug use: Yes    Types: Marijuana   Allergies Bee venom  Review of Systems Review of Systems  Constitutional: Positive for fatigue.   All other systems are reviewed and are negative for acute change except as noted in the HPI  Physical Exam Vital Signs  I have reviewed the triage vital signs BP (!) 118/58 (BP Location: Left Arm)   Pulse 95   Temp (!) 101.9 F (38.8 C) (Rectal)   Resp 18   Ht 6' 1"  (1.854 m)   Wt 114.8 kg (253 lb)   SpO2 95%   BMI 33.38 kg/m   Physical Exam  Constitutional: He is oriented to person, place, and time. He appears well-developed and well-nourished. No distress.  HENT:  Head: Normocephalic and  atraumatic.  Nose: Nose normal.  Eyes: Pupils are equal, round, and reactive to light. Conjunctivae and EOM are normal. Right eye exhibits no discharge. Left eye exhibits no discharge. No scleral icterus.  Neck: Normal range of motion. Neck supple.  Cardiovascular: Normal rate and regular rhythm. Exam reveals no gallop and no friction rub.  No murmur heard. Pulmonary/Chest: Effort normal and breath sounds normal. No stridor. No respiratory distress. He has no rales.  Abdominal: Soft. He exhibits no distension. There is no tenderness.  Genitourinary:     Musculoskeletal: He exhibits no edema or tenderness.  Neurological: He is alert and oriented to person, place, and time.  Skin: Skin is warm and dry. No rash noted. He is not diaphoretic. No erythema.  Psychiatric: He has a normal mood and affect.  Vitals reviewed.   ED Results and Treatments Labs (all labs ordered are listed, but only abnormal results are displayed) Labs Reviewed  CBC WITH DIFFERENTIAL/PLATELET - Abnormal; Notable for the following components:      Result Value   RBC 3.63 (*)    Hemoglobin 10.7 (*)    HCT 32.9 (*)    All other components within normal limits  BASIC METABOLIC PANEL - Abnormal; Notable for the following components:   Glucose, Bld 128 (*)    Creatinine, Ser 1.55 (*)    GFR calc non Af Amer 50 (*)    GFR calc Af Amer 58 (*)    All other components within normal limits  I-STAT VENOUS BLOOD GAS, ED - Abnormal; Notable for the following components:   pH, Ven 7.436 (*)    pCO2, Ven 41.6 (*)    pO2, Ven 72.0 (*)    Acid-Base Excess 4.0 (*)  All other components within normal limits  RAPID STREP SCREEN ([facility] & [facility] ONLY)  CULTURE, GROUP A STREP ([place])  CULTURE, BLOOD (ROUTINE X 2)  CULTURE, BLOOD (ROUTINE X 2)  URINALYSIS, ROUTINE W REFLEX MICROSCOPIC  BLOOD GAS, VENOUS  I-STAT CG4 LACTIC ACID, ED  I-STAT CG4 LACTIC ACID, ED                                                                                                                          EKG  EKG Interpretation  Date/Time:  Thursday September 17 2017 19:04:00 EDT Ventricular Rate:  91 PR Interval:  162 QRS Duration: 104 QT Interval:  330 QTC Calculation: 405 R Axis:   -39 Text Interpretation:  Normal sinus rhythm Left axis deviation Cannot rule out Anterior infarct , age undetermined Abnormal ECG No significant change since last tracing Confirmed by Addison Lank [phone]) on 09/17/2017 7:17:03 PM      Radiology Dg Chest 2 View  Result Date: 09/17/2017 CLINICAL DATA:  Fever and chest pain EXAM: CHEST - 2 VIEW COMPARISON:  04/24/2016 FINDINGS: Left-sided pacing device as before. No significant effusion. Cardiomegaly. No focal airspace disease. No pneumothorax. IMPRESSION: No active cardiopulmonary disease.  Stable cardiomegaly. Electronically Signed   By: Donavan Foil M.D.   On: 09/17/2017 23:25   Pertinent labs & imaging results that were available during my care of the patient were reviewed by me and considered in my medical decision making (see chart for details).  Medications Ordered in ED Medications  sodium chloride 0.9 % bolus 1,000 mL (1,000 mLs Intravenous New Bag/Given 09/18/17 0036)  clindamycin (CLEOCIN) IVPB 600 mg (600 mg Intravenous New Bag/Given 09/18/17 0036)  lidocaine-EPINEPHrine (XYLOCAINE W/EPI) 2 %-1:100000 (with pres) injection 20 mL (20 mLs Intradermal Given 09/17/17 2122)  acetaminophen (TYLENOL) tablet 1,000 mg (1,000 mg Oral Given 09/17/17 2252)  iopamidol (ISOVUE-300) 61 % injection 100 mL (100 mLs Intravenous Contrast Given 09/17/17 2349)                                                                                                                                    Procedures .Marland KitchenIncision and Drainage Date/Time: 09/18/2017 12:35 AM Performed by: Fatima Blank, MD Authorized by: Fatima Blank, MD   Consent:    Consent obtained:  Verbal   Consent given by:  Patient   Risks  discussed:  Incomplete drainage and infection  Alternatives discussed:  Alternative treatment Location:    Type:  Abscess   Size:  2.5cm   Location:  Anogenital   Anogenital location:  Scrotal space Pre-procedure details:    Skin preparation:  Betadine Anesthesia (see MAR for exact dosages):    Anesthesia method:  Local infiltration   Local anesthetic:  Lidocaine 2% WITH epi Procedure type:    Complexity:  Complex Procedure details:    Needle aspiration: no     Incision types:  Cruciate   Incision depth:  Subcutaneous   Scalpel blade:  11   Wound management:  Probed and deloculated   Drainage:  Purulent   Drainage amount:  Moderate   Packing materials:  1/2 in iodoform gauze Post-procedure details:    Patient tolerance of procedure:  Tolerated well, no immediate complications    (including critical care time)  Medical Decision Making / ED Course I have reviewed the nursing notes for this encounter and the patient's prior records (if available in EHR or on provided paperwork).    Patient is febrile with stable vital signs.  CBC without leukocytosis.  Lactic acid normal.  Rapid strep negative.  Possible sources of infection will be viral process versus the perineal abscess.  Chest x-ray negative for any evidence of pneumonia.  UA without evidence of infection.  Given the patient's history of diabetes, CT scan ordered to assess for deep tissue involvement of the abscess and to rule out Fournier's.  Ct currently pending.  Given IV ABx. May require admission for continued IV Abx give h/o DM with perineal cellulitits.  Patient care turned over to Dr Florina Ou at [facility]. Patient case and results discussed in detail; please see their note for further ED managment.      Final Clinical Impression(s) / ED Diagnoses Final diagnoses:  Fever  Abscess  Cellulitis of other specified site      This chart was dictated using voice recognition software.  Despite best efforts to  proofread,  errors can occur which can change the documentation meaning.   Fatima Blank, MD 09/18/17 0040

## 2017-09-17 NOTE — ED Triage Notes (Signed)
C/o left groin abscess x 2 days-sore thraot x today-CP x 2 days-NAD-steady gait

## 2017-09-17 NOTE — ED Notes (Signed)
One set of blood cultures done if need it.

## 2017-09-18 ENCOUNTER — Encounter (HOSPITAL_COMMUNITY): Payer: Self-pay

## 2017-09-18 DIAGNOSIS — N400 Enlarged prostate without lower urinary tract symptoms: Secondary | ICD-10-CM | POA: Diagnosis not present

## 2017-09-18 DIAGNOSIS — Z95810 Presence of automatic (implantable) cardiac defibrillator: Secondary | ICD-10-CM | POA: Diagnosis not present

## 2017-09-18 DIAGNOSIS — N401 Enlarged prostate with lower urinary tract symptoms: Secondary | ICD-10-CM | POA: Diagnosis not present

## 2017-09-18 DIAGNOSIS — I5022 Chronic systolic (congestive) heart failure: Secondary | ICD-10-CM | POA: Diagnosis not present

## 2017-09-18 DIAGNOSIS — N138 Other obstructive and reflux uropathy: Secondary | ICD-10-CM | POA: Diagnosis not present

## 2017-09-18 DIAGNOSIS — L02214 Cutaneous abscess of groin: Secondary | ICD-10-CM | POA: Diagnosis not present

## 2017-09-18 DIAGNOSIS — Z7982 Long term (current) use of aspirin: Secondary | ICD-10-CM | POA: Diagnosis not present

## 2017-09-18 DIAGNOSIS — E669 Obesity, unspecified: Secondary | ICD-10-CM | POA: Diagnosis not present

## 2017-09-18 DIAGNOSIS — K219 Gastro-esophageal reflux disease without esophagitis: Secondary | ICD-10-CM | POA: Diagnosis not present

## 2017-09-18 DIAGNOSIS — Z7984 Long term (current) use of oral hypoglycemic drugs: Secondary | ICD-10-CM | POA: Diagnosis not present

## 2017-09-18 DIAGNOSIS — L0291 Cutaneous abscess, unspecified: Secondary | ICD-10-CM | POA: Diagnosis present

## 2017-09-18 DIAGNOSIS — I509 Heart failure, unspecified: Secondary | ICD-10-CM | POA: Diagnosis not present

## 2017-09-18 DIAGNOSIS — N183 Chronic kidney disease, stage 3 (moderate): Secondary | ICD-10-CM | POA: Diagnosis not present

## 2017-09-18 DIAGNOSIS — Z87891 Personal history of nicotine dependence: Secondary | ICD-10-CM | POA: Diagnosis not present

## 2017-09-18 DIAGNOSIS — I130 Hypertensive heart and chronic kidney disease with heart failure and stage 1 through stage 4 chronic kidney disease, or unspecified chronic kidney disease: Secondary | ICD-10-CM | POA: Diagnosis not present

## 2017-09-18 DIAGNOSIS — D631 Anemia in chronic kidney disease: Secondary | ICD-10-CM | POA: Diagnosis not present

## 2017-09-18 DIAGNOSIS — E1142 Type 2 diabetes mellitus with diabetic polyneuropathy: Secondary | ICD-10-CM | POA: Diagnosis not present

## 2017-09-18 DIAGNOSIS — L03314 Cellulitis of groin: Secondary | ICD-10-CM | POA: Diagnosis not present

## 2017-09-18 DIAGNOSIS — L0390 Cellulitis, unspecified: Secondary | ICD-10-CM | POA: Diagnosis present

## 2017-09-18 DIAGNOSIS — J029 Acute pharyngitis, unspecified: Secondary | ICD-10-CM | POA: Diagnosis not present

## 2017-09-18 DIAGNOSIS — E1122 Type 2 diabetes mellitus with diabetic chronic kidney disease: Secondary | ICD-10-CM | POA: Diagnosis not present

## 2017-09-18 DIAGNOSIS — Z79899 Other long term (current) drug therapy: Secondary | ICD-10-CM | POA: Diagnosis not present

## 2017-09-18 DIAGNOSIS — Z6833 Body mass index (BMI) 33.0-33.9, adult: Secondary | ICD-10-CM | POA: Diagnosis not present

## 2017-09-18 DIAGNOSIS — I255 Ischemic cardiomyopathy: Secondary | ICD-10-CM | POA: Diagnosis not present

## 2017-09-18 LAB — INFLUENZA PANEL BY PCR (TYPE A & B)
INFLBPCR: NEGATIVE
Influenza A By PCR: NEGATIVE

## 2017-09-18 LAB — CBC
HEMATOCRIT: 31.4 % — AB (ref 39.0–52.0)
HEMOGLOBIN: 10.2 g/dL — AB (ref 13.0–17.0)
MCH: 29.8 pg (ref 26.0–34.0)
MCHC: 32.5 g/dL (ref 30.0–36.0)
MCV: 91.8 fL (ref 78.0–100.0)
Platelets: 258 10*3/uL (ref 150–400)
RBC: 3.42 MIL/uL — AB (ref 4.22–5.81)
RDW: 14.3 % (ref 11.5–15.5)
WBC: 6 10*3/uL (ref 4.0–10.5)

## 2017-09-18 LAB — CBG MONITORING, ED: Glucose-Capillary: 149 mg/dL — ABNORMAL HIGH (ref 65–99)

## 2017-09-18 LAB — CREATININE, SERUM
CREATININE: 1.46 mg/dL — AB (ref 0.61–1.24)
GFR calc Af Amer: >60 mL/min (ref >60)
GFR, EST NON AFRICAN AMERICAN: 54 mL/min — AB (ref >60)

## 2017-09-18 MED ORDER — ACETAMINOPHEN 650 MG RE SUPP: 650.0000 mg | Freq: Four times a day (QID) | RECTAL | Status: DC | PRN

## 2017-09-18 MED ORDER — ACETAMINOPHEN 325 MG PO TABS: 650.0000 mg | ORAL_TABLET | Freq: Four times a day (QID) | ORAL | Status: DC | PRN

## 2017-09-18 MED ORDER — TAMSULOSIN HCL 0.4 MG PO CAPS
0.8000 mg | ORAL_CAPSULE | Freq: Every day | ORAL | Status: DC
  Administered 2017-09-18 – 2017-09-20 (×3): 0.8 mg via ORAL
  Filled 2017-09-18 (×3): qty 2

## 2017-09-18 MED ORDER — GABAPENTIN 300 MG PO CAPS: 300.0000 mg | ORAL_CAPSULE | Freq: Three times a day (TID) | ORAL | Status: DC

## 2017-09-18 MED ORDER — HYDROCODONE-ACETAMINOPHEN 5-325 MG PO TABS
1.0000 | ORAL_TABLET | Freq: Four times a day (QID) | ORAL | Status: DC | PRN
  Administered 2017-09-18: 1 via ORAL
  Filled 2017-09-18: qty 1

## 2017-09-18 MED ORDER — GABAPENTIN 300 MG PO CAPS: 300.0000 mg | ORAL_CAPSULE | Freq: Every day | ORAL | Status: DC

## 2017-09-18 MED ORDER — SODIUM CHLORIDE 0.9 % IV SOLN
INTRAVENOUS | Status: DC
  Administered 2017-09-18: 05:00:00 via INTRAVENOUS

## 2017-09-18 MED ORDER — DOXYCYCLINE HYCLATE 100 MG PO TABS
100.0000 mg | ORAL_TABLET | Freq: Two times a day (BID) | ORAL | Status: DC
  Administered 2017-09-18 – 2017-09-20 (×5): 100 mg via ORAL
  Filled 2017-09-18 (×5): qty 1

## 2017-09-18 MED ORDER — ONDANSETRON HCL 4 MG/2ML IJ SOLN
4.0000 mg | Freq: Four times a day (QID) | INTRAMUSCULAR | Status: DC | PRN
  Administered 2017-09-19 – 2017-09-20 (×3): 4 mg via INTRAVENOUS
  Filled 2017-09-18 (×3): qty 2

## 2017-09-18 MED ORDER — SODIUM CHLORIDE 0.9 % IV BOLUS
1000.0000 mL | Freq: Once | INTRAVENOUS | Status: AC
  Administered 2017-09-18: 1000 mL via INTRAVENOUS

## 2017-09-18 MED ORDER — HYDROMORPHONE HCL 1 MG/ML IJ SOLN
1.0000 mg | Freq: Once | INTRAMUSCULAR | Status: AC
  Administered 2017-09-18: 1 mg via INTRAVENOUS
  Filled 2017-09-18: qty 1

## 2017-09-18 MED ORDER — ENOXAPARIN SODIUM 60 MG/0.6ML ~~LOC~~ SOLN
60.0000 mg | SUBCUTANEOUS | Status: DC
  Administered 2017-09-18 – 2017-09-20 (×3): 60 mg via SUBCUTANEOUS
  Filled 2017-09-18 (×3): qty 0.6

## 2017-09-18 MED ORDER — SACUBITRIL-VALSARTAN 24-26 MG PO TABS
1.0000 | ORAL_TABLET | Freq: Two times a day (BID) | ORAL | Status: DC
Start: 2017-09-18 — End: 2017-09-20
  Administered 2017-09-18 – 2017-09-20 (×5): 1 via ORAL
  Filled 2017-09-18 (×5): qty 1

## 2017-09-18 MED ORDER — CARVEDILOL 25 MG PO TABS
25.0000 mg | ORAL_TABLET | Freq: Two times a day (BID) | ORAL | Status: DC
  Administered 2017-09-18 – 2017-09-20 (×5): 25 mg via ORAL
  Filled 2017-09-18 (×5): qty 1

## 2017-09-18 MED ORDER — CLINDAMYCIN PHOSPHATE 600 MG/50ML IV SOLN
600.0000 mg | Freq: Three times a day (TID) | INTRAVENOUS | Status: DC
  Administered 2017-09-18: 600 mg via INTRAVENOUS
  Filled 2017-09-18 (×2): qty 50

## 2017-09-18 MED ORDER — AMOXICILLIN-POT CLAVULANATE 875-125 MG PO TABS
1.0000 | ORAL_TABLET | Freq: Two times a day (BID) | ORAL | Status: DC
  Administered 2017-09-18 – 2017-09-20 (×5): 1 via ORAL
  Filled 2017-09-18 (×5): qty 1

## 2017-09-18 MED ORDER — FUROSEMIDE 40 MG PO TABS
80.0000 mg | ORAL_TABLET | Freq: Every day | ORAL | Status: DC
  Administered 2017-09-18 – 2017-09-20 (×3): 80 mg via ORAL
  Filled 2017-09-18 (×3): qty 2

## 2017-09-18 MED ORDER — CYCLOBENZAPRINE HCL 5 MG PO TABS: 5.0000 mg | ORAL_TABLET | Freq: Three times a day (TID) | ORAL | Status: DC | PRN

## 2017-09-18 MED ORDER — GABAPENTIN 300 MG PO CAPS
600.0000 mg | ORAL_CAPSULE | Freq: Every day | ORAL | Status: DC
  Administered 2017-09-18 – 2017-09-19 (×2): 600 mg via ORAL
  Filled 2017-09-18 (×2): qty 2

## 2017-09-18 MED ORDER — ASPIRIN EC 81 MG PO TBEC
81.0000 mg | DELAYED_RELEASE_TABLET | Freq: Every day | ORAL | Status: DC
  Administered 2017-09-18 – 2017-09-20 (×3): 81 mg via ORAL
  Filled 2017-09-18 (×3): qty 1

## 2017-09-18 MED ORDER — OXYCODONE HCL 5 MG PO TABS
5.0000 mg | ORAL_TABLET | ORAL | Status: DC | PRN
  Administered 2017-09-18 (×2): 5 mg via ORAL
  Filled 2017-09-18 (×2): qty 1

## 2017-09-18 MED ORDER — LEVALBUTEROL HCL 0.63 MG/3ML IN NEBU
0.6300 mg | INHALATION_SOLUTION | Freq: Four times a day (QID) | RESPIRATORY_TRACT | Status: DC | PRN
Start: 2017-09-18 — End: 2017-09-20

## 2017-09-18 MED ORDER — GABAPENTIN 300 MG PO CAPS
300.0000 mg | ORAL_CAPSULE | Freq: Every day | ORAL | Status: DC
  Administered 2017-09-18 – 2017-09-20 (×3): 300 mg via ORAL
  Filled 2017-09-18 (×3): qty 1

## 2017-09-18 MED ORDER — POTASSIUM CHLORIDE CRYS ER 20 MEQ PO TBCR
20.0000 meq | EXTENDED_RELEASE_TABLET | Freq: Every day | ORAL | Status: DC
  Administered 2017-09-18 – 2017-09-20 (×3): 20 meq via ORAL
  Filled 2017-09-18 (×3): qty 1

## 2017-09-18 MED ORDER — PANTOPRAZOLE SODIUM 40 MG PO TBEC
40.0000 mg | DELAYED_RELEASE_TABLET | Freq: Every day | ORAL | Status: DC
  Administered 2017-09-18 – 2017-09-20 (×3): 40 mg via ORAL
  Filled 2017-09-18 (×3): qty 1

## 2017-09-18 MED ORDER — CLINDAMYCIN PHOSPHATE 600 MG/50ML IV SOLN
600.0000 mg | Freq: Once | INTRAVENOUS | Status: AC
  Administered 2017-09-18: 600 mg via INTRAVENOUS
  Filled 2017-09-18: qty 50

## 2017-09-18 MED ORDER — ATORVASTATIN CALCIUM 10 MG PO TABS
10.0000 mg | ORAL_TABLET | Freq: Every day | ORAL | Status: DC
  Administered 2017-09-18 – 2017-09-20 (×3): 10 mg via ORAL
  Filled 2017-09-18 (×4): qty 1

## 2017-09-18 MED ORDER — HYDROMORPHONE HCL 1 MG/ML IJ SOLN
1.0000 mg | INTRAMUSCULAR | Status: DC | PRN
  Administered 2017-09-18 – 2017-09-19 (×3): 1 mg via INTRAVENOUS
  Filled 2017-09-18 (×3): qty 1

## 2017-09-18 NOTE — Plan of Care (Signed)
Plan of care discussed with patient 

## 2017-09-18 NOTE — Consult Note (Signed)
Reason for Consult:  Left groin abscess Referring Physician: Janyce Llanos PCP: [name], MD   [name] is an 52 y.o. male.  HPI: Patient is a 52 year old male who presented to [facility] yesterday with complaints of a left groin abscess, sore throat chest pain for 2 days.  The left groin abscess was apparently draining at the time of admission.  He was admitted to medicine for further antibiotic therapy and medical management.  Workup since admission to the ED shows a fever spike of 101.9 at 1900 hrs. last p.m.  He has been afebrile since that time.  Heart rate and blood pressure are fine.  Admit labs showed a normal blood gas, creatinine of 1.55, glucose of 128, WBC of 8.9, hemoglobin 10.7, hematocrit 32.9.  No platelets recorded.  This is negative.  Blood cultures are pending.  CT of the abdomen obtained with contrast yesterday had no acute findings.  He has a nonobstructing left inguinal hernia containing fat.  Cardiomegaly, some adenomas, and prostamegaly.  Mild cardiomegaly without effusion.  There is a right ventricular ICD lead some subsegmental atelectasis.  We are asked to see.  Past Medical History:  Diagnosis Date  . Cardiomyopathy ([place])    a. 07/2013 Echo: EF 20-25%, diff HK, Gr2 DD, mild MR, mod dil LA/RA.  Marland Kitchen CHF (congestive heart failure) ([place])   . Chronic bronchitis ([place])    "get it most q yr" (12/23/2013)  . Chronic systolic dysfunction of left ventricle   . Fracture of left humerus    a. 07/2013.  Marland Kitchen GERD (gastroesophageal reflux disease)    not at present time  . Heart murmur    "born w/it"   . History of renal calculi   . Hypertension   . Kidney stones   . Obesity   . 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 Syncope   . Type II diabetes mellitus ([place]) 2006    Past Surgical History:  Procedure Laterality Date  . CARDIAC  CATHETERIZATION  08/10/13  . CHOLECYSTECTOMY  ~ 2010  . IMPLANTABLE CARDIOVERTER DEFIBRILLATOR IMPLANT  12/23/2013   STJ Fortify ICD implanted by [name] for cardiomyopathy and syncope  . IMPLANTABLE CARDIOVERTER DEFIBRILLATOR IMPLANT N/A 12/23/2013   Procedure: IMPLANTABLE CARDIOVERTER DEFIBRILLATOR IMPLANT;  Surgeon: Coralyn Mark, MD;  Location: [facility] CATH LAB;  Service: Cardiovascular;  Laterality: N/A;  . LEFT HEART CATHETERIZATION WITH CORONARY ANGIOGRAM N/A 08/10/2013   Procedure: LEFT HEART CATHETERIZATION WITH CORONARY ANGIOGRAM;  Surgeon: Jettie Booze, MD;  Location: [facility] CATH LAB;  Service: Cardiovascular;  Laterality: N/A;  . ORIF HUMERUS FRACTURE Left 08/12/2013   Procedure: OPEN REDUCTION INTERNAL FIXATION (ORIF) HUMERAL SHAFT FRACTURE;  Surgeon: Newt Minion, MD;  Location: [facility];  Service: Orthopedics;  Laterality: Left;  Open Reduction Internal Fixation Left Humerus  . URETEROSCOPY     "laser for kidney stones"    Family History  Problem Relation Age of Onset  . Diabetes Father   . Arthritis Father   . Hypertension Father   . Diabetes Mother   . Arthritis Mother   . Hypertension Mother   . Hypertension Brother     Social History:  reports that he quit smoking about 3 years ago. His smoking use included cigarettes. He has a 28.00 pack-year smoking history. He has never used smokeless tobacco. He reports that he drinks alcohol. He  reports that he has current or past drug history. Drug: Marijuana.  Allergies:  Allergies  Allergen Reactions  . Bee Venom Anaphylaxis    Medications:  Prior to Admission:  Medications Prior to Admission  Medication Sig Dispense Refill Last Dose  . ACCU-CHEK AVIVA PLUS test strip USE ONE STRIP THREE TIMES DAILY 100 each 12 09/17/2017 at Unknown time  . aspirin 81 MG tablet Take 81 mg by mouth daily.   09/17/2017 at Unknown time  . atorvastatin (LIPITOR) 10 MG tablet Take 1 tablet (10 mg total) daily by mouth. 30 tablet 11 09/17/2017 at  Unknown time  . Blood Glucose Monitoring Suppl (ACCU-CHEK AVIVA PLUS) W/DEVICE KIT 1 Device by Does not apply route 3 (three) times daily after meals. 1 kit 0 09/17/2017 at Unknown time  . carvedilol (COREG) 25 MG tablet TAKE ONE TABLET BY MOUTH TWICE DAILY 180 tablet 3 09/17/2017 at Unknown time  . furosemide (LASIX) 80 MG tablet TAKE 1 TABLET BY MOUTH TWICE DAILY 180 tablet 0 09/17/2017 at Unknown time  . gabapentin (NEURONTIN) 300 MG capsule 300 mg in the morning, 300 mg in afternoon and 600 mg in evening 120 capsule 3 09/16/2017 at Unknown time  . gabapentin (NEURONTIN) 300 MG capsule TAKE ONE CAPSULE BY MOUTH THREE TIMES DAILY 90 capsule 5 09/17/2017 at Unknown time  . JANUVIA 100 MG tablet TAKE ONE TABLET BY MOUTH ONCE DAILY 90 tablet 3 09/17/2017 at Unknown time  . KLOR-CON M20 20 MEQ tablet TAKE 1 TABLET BY MOUTH ONCE DAILY 90 tablet 0 09/17/2017 at Unknown time  . Lancet Devices (ACCU-CHEK SOFTCLIX) lancets 1 each by Other route 3 (three) times daily. 1 each 0 09/17/2017 at Unknown time  . metFORMIN (GLUCOPHAGE) 1000 MG tablet TAKE 1 TABLET BY MOUTH ONCE DAILY IN THE MORNING AND 1 & 1/2 (ONE & ONE-HALF) ONCE DAILY IN THE EVENING 75 tablet 2 09/17/2017 at Unknown time  . pantoprazole (PROTONIX) 40 MG tablet TAKE 1 TABLET BY MOUTH ONCE DAILY 90 tablet 0 09/17/2017 at Unknown time  . sacubitril-valsartan (ENTRESTO) 24-26 MG Take 1 tablet by mouth 2 (two) times daily. KEEP OV. 180 tablet 0 09/17/2017 at Unknown time  . tamsulosin (FLOMAX) 0.4 MG CAPS capsule Take by mouth 1 capsule 1 hr after breakfast for two weeks, then increase to 0.8 mg daily 60 capsule 2 09/17/2017 at Unknown time  . cyclobenzaprine (FLEXERIL) 5 MG tablet TAKE 1 TABLET BY MOUTH THREE TIMES DAILY AS NEEDED FOR MUSCLE SPASM 60 tablet 0 Unknown at Unknown time  . Elastic Bandages & Supports (LUMBAR BACK BRACE/SUPPORT PAD) MISC 1 each by Does not apply route daily. ICD 10 M54.5 (Patient not taking: Reported on 07/24/2017) 1 each 0 Unknown at  Unknown time  . levalbuterol (XOPENEX HFA) 45 MCG/ACT inhaler Inhale 1-2 puffs into the lungs every 6 (six) hours as needed for wheezing. (Patient not taking: Reported on 07/24/2017) 1 Inhaler 12 Unknown at Unknown time   Scheduled:  Continuous:  Anti-infectives (From admission, onward)   Start     Dose/Rate Route Frequency Ordered Stop   09/18/17 0800  clindamycin (CLEOCIN) IVPB 600 mg  Status:  Discontinued     600 mg 100 mL/hr over 30 Minutes Intravenous Every 8 hours 09/18/17 0415 09/18/17 0826   09/18/17 0030  clindamycin (CLEOCIN) IVPB 600 mg     600 mg 100 mL/hr over 30 Minutes Intravenous  Once 09/18/17 0019 09/18/17 0051      Results for orders placed or performed during the  hospital encounter of 09/17/17 (from the past 48 hour(s))  Rapid Strep Screen ([facility] ONLY)     Status: None   Collection Time: 09/17/17  8:11 PM  Result Value Ref Range   Streptococcus, Group A Screen (Direct) NEGATIVE NEGATIVE    Comment: (NOTE) A Rapid Antigen test may result negative if the antigen level in the sample is below the detection level of this test. The FDA has not cleared this test as a stand-alone test therefore the rapid antigen negative result has reflexed to a Group A Strep culture. Performed at [facility], [address] [place], [place] [address]   Urinalysis, Routine w reflex microscopic     Status: None   Collection Time: 09/17/17  9:03 PM  Result Value Ref Range   Color, Urine YELLOW YELLOW   APPearance CLEAR CLEAR   Specific Gravity, Urine 1.010 1.005 - 1.030   pH 5.5 5.0 - 8.0   Glucose, UA NEGATIVE NEGATIVE mg/dL   Hgb urine dipstick NEGATIVE NEGATIVE   Bilirubin Urine NEGATIVE NEGATIVE   Ketones, ur NEGATIVE NEGATIVE mg/dL   Protein, ur NEGATIVE NEGATIVE mg/dL   Nitrite NEGATIVE NEGATIVE   Leukocytes, UA NEGATIVE NEGATIVE    Comment: Microscopic not done on urines with negative protein, blood, leukocytes, nitrite, or glucose < 500 mg/dL. Performed  at [facility], [address] [place], [place] [address]   CBC with Differential/Platelet     Status: Abnormal   Collection Time: 09/17/17  9:22 PM  Result Value Ref Range   WBC 8.9 4.0 - 10.5 K/uL   RBC 3.63 (L) 4.22 - 5.81 MIL/uL   Hemoglobin 10.7 (L) 13.0 - 17.0 g/dL   HCT 32.9 (L) 39.0 - 52.0 %   MCV 90.6 78.0 - 100.0 fL   MCH 29.5 26.0 - 34.0 pg   MCHC 32.5 30.0 - 36.0 g/dL   RDW 13.9 11.5 - 15.5 %   Platelets 277 150 - 400 K/uL   Neutrophils Relative % 62 %   Neutro Abs 5.5 1.7 - 7.7 K/uL   Lymphocytes Relative 27 %   Lymphs Abs 2.4 0.7 - 4.0 K/uL   Monocytes Relative 10 %   Monocytes Absolute 0.9 0.1 - 1.0 K/uL   Eosinophils Relative 1 %   Eosinophils Absolute 0.1 0.0 - 0.7 K/uL   Basophils Relative 0 %   Basophils Absolute 0.0 0.0 - 0.1 K/uL    Comment: Performed at [facility], [address] [place], [place] [address]  Basic metabolic panel     Status: Abnormal   Collection Time: 09/17/17  9:22 PM  Result Value Ref Range   Sodium 137 135 - 145 mmol/L   Potassium 3.6 3.5 - 5.1 mmol/L   Chloride 103 101 - 111 mmol/L   CO2 23 22 - 32 mmol/L   Glucose, Bld 128 (H) 65 - 99 mg/dL   BUN 20 6 - 20 mg/dL   Creatinine, Ser 1.55 (H) 0.61 - 1.24 mg/dL   Calcium 8.9 8.9 - 10.3 mg/dL   GFR calc non Af Amer 50 (L) >60 mL/min   GFR calc Af Amer 58 (L) >60 mL/min    Comment: (NOTE) The eGFR has been calculated using the CKD EPI equation. This calculation has not been validated in all clinical situations. eGFR's persistently <60 mL/min signify possible Chronic Kidney Disease.    Anion gap 11 5 - 15    Comment: Performed at [facility], [address] [place], [place] [address]  I-Stat venous blood gas, ED     Status: Abnormal   Collection Time: 09/17/17  9:27 PM  Result Value Ref Range   pH, Ven 7.436 (H) 7.250 - 7.430   pCO2, Ven 41.6 (L) 44.0 - 60.0 mmHg   pO2, Ven 72.0 (H) 32.0 - 45.0 mmHg   Bicarbonate 27.6 20.0 - 28.0 mmol/L    TCO2 29 22 - 32 mmol/L   O2 Saturation 93.0 %   Acid-Base Excess 4.0 (H) 0.0 - 2.0 mmol/L   Patient temperature 101.9 F    Collection site IV START    Drawn by Nurse    Sample type VENOUS   I-Stat CG4 Lactic Acid, ED     Status: None   Collection Time: 09/17/17  9:34 PM  Result Value Ref Range   Lactic Acid, Venous 1.03 0.5 - 1.9 mmol/L  CBG monitoring, ED     Status: Abnormal   Collection Time: 09/18/17  2:35 AM  Result Value Ref Range   Glucose-Capillary 149 (H) 65 - 99 mg/dL    Dg Chest 2 View  Result Date: 09/17/2017 CLINICAL DATA:  Fever and chest pain EXAM: CHEST - 2 VIEW COMPARISON:  04/24/2016 FINDINGS: Left-sided pacing device as before. No significant effusion. Cardiomegaly. No focal airspace disease. No pneumothorax. IMPRESSION: No active cardiopulmonary disease.  Stable cardiomegaly. Electronically Signed   By: Donavan Foil M.D.   On: 09/17/2017 23:25   Ct Abdomen Pelvis W Contrast  Result Date: 09/18/2017 CLINICAL DATA:  Left inguinal boil x2 days with fever. Left bump on the buttock for 2 years. EXAM: CT ABDOMEN AND PELVIS WITH CONTRAST TECHNIQUE: Multidetector CT imaging of the abdomen and pelvis was performed using the standard protocol following bolus administration of intravenous contrast. CONTRAST:  12m ISOVUE-300 IOPAMIDOL (ISOVUE-300) INJECTION 61% COMPARISON:  08/06/2008 CT abdomen and pelvis, lumbar spine CT 10/24/2016 FINDINGS: Lower chest: Mild cardiomegaly without pericardial effusion. Right ventricular ICD lead in place. There is subsegmental atelectasis at the lung bases. Hepatobiliary: No focal liver abnormality is seen. Status post cholecystectomy. No biliary dilatation. Pancreas: Unremarkable. No pancreatic ductal dilatation or surrounding inflammatory changes. Spleen: Normal in size without focal abnormality. Adrenals/Urinary Tract: Bilateral adrenal nodules measuring 13 mm in the right and 22 mm on the left with low Hounsfield units compatible with benign  adenomas. Stable finding on the right since 2010 and slight interval enlargement on left since prior. Symmetric enhancement of both kidneys without obstructive uropathy. 1 cm cyst in the interpolar left kidney not well visualized on the unenhanced study from 2010. Unremarkable urinary bladder and ureters. Stomach/Bowel: Small hiatal hernia. Decompressed stomach normal small bowel rotation. No bowel obstruction or inflammation. Normal appendix. Moderate stool burden within the colon with scattered colonic diverticulosis. No diverticulitis. Vascular/Lymphatic: No aortic aneurysm. Mild atherosclerosis of the common iliac arteries bilaterally along the proximal aspect. No adenopathy. Reproductive: Mild prostatomegaly with the prostate measuring 5 cm in diameter. Other: Left inguinal hernia containing a segment of sigmoid colon and properitoneal fat without bowel obstruction. CT findings to correspond with the reported bump on the patient's left buttock. Musculoskeletal: No acute or significant osseous findings. IMPRESSION: 1. Nonobstructing left inguinal hernia containing properitoneal fat and a short segment of sigmoid colon. 2. Mild cardiomegaly. 3. Bilateral hypodense adrenal nodules compatible with benign adenomas measuring 13 mm on the right and 22 mm on the left. 4. Interpolar 1 cm left renal cyst.  No obstructive uropathy. 5. Mild  prostatomegaly. Electronically Signed   By: Ashley Royalty M.D.   On: 09/18/2017 00:41    Review of Systems  Constitutional: Positive for chills and fever. Negative for diaphoresis, malaise/fatigue and weight loss.  HENT: Positive for sore throat.   Eyes: Negative.   Respiratory: Positive for cough (occasional) and wheezing (occasional ). Negative for hemoptysis, sputum production and shortness of breath.   Cardiovascular: Positive for chest pain (daily chest pain) and orthopnea (occasional).  Gastrointestinal: Positive for diarrhea (chronic since cholecystectomy) and heartburn.  Negative for abdominal pain, blood in stool, melena, nausea and vomiting.  Genitourinary: Negative.   Musculoskeletal:       Diabetic neuropathy  Skin:       He thought it was just a boil and started 2 days ago  Neurological: Negative.   Endo/Heme/Allergies: Negative.   Psychiatric/Behavioral: Positive for substance abuse (occasional MJ use).   Blood pressure 121/76, pulse 69, temperature 98.5 F (36.9 C), temperature source Oral, resp. rate 18, height 6' 1"  (1.854 m), weight 114.8 kg (253 lb), SpO2 98 %. Physical Exam  Constitutional: He is oriented to person, place, and time. He appears well-developed and well-nourished. No distress.  HENT:  Head: Normocephalic and atraumatic.  Mouth/Throat: Oropharynx is clear and moist. No oropharyngeal exudate.  Eyes: Right eye exhibits no discharge. Left eye exhibits no discharge. No scleral icterus.  Pupils are equal  Neck: Normal range of motion. Neck supple. No JVD present. No tracheal deviation present. No thyromegaly present.  Cardiovascular: Normal rate, regular rhythm, normal heart sounds and intact distal pulses.  No murmur heard. Respiratory: Effort normal and breath sounds normal. No respiratory distress. He has no wheezes. He has no rales. He exhibits no tenderness.  GI: Soft. Bowel sounds are normal. He exhibits no distension and no mass. There is no tenderness. There is no rebound and no guarding.  Genitourinary:  Genitourinary Comments: He has a 1.5 cm I&D in the deep fold of the left groin medial side below the scrotum and the medial thigh.  It is a + like incision.  It is draining a purulent fluid, with iodoform packing in it.  I removed the packing and after some dilaudid and assistance from the nursing staff, I was able to examine it.  We cleaned the site with soap and water.  Area at the base of the inguinal fold was macerated and wet from the drainage.  The abscess site is about 1.5 cm deep with a good deal of swelling above and  below the open site.  Once I got an applicator stick into the site it appears well drained, no further undermining or extension of a fluid collection was found.  I cleaned the area and repacked with iodoform gauze.  I think that for right now it is well drained.    Musculoskeletal: He exhibits no edema or tenderness.  Lymphadenopathy:    He has no cervical adenopathy.  Neurological: He is alert and oriented to person, place, and time. No cranial nerve deficit.  Skin: Skin is warm and dry. No rash noted. He is not diaphoretic. No erythema. No pallor.  Psychiatric: He has a normal mood and affect. His behavior is normal. Judgment and thought content normal.    Assessment/Plan: Left inguinal abscess with I&D  Cardiomyopathy/hx of CHF with daily chest pain AICD - [name] Cardilolgy Hx of tobacco use/MJ use AODM with diabetic neuropathy Hypertension On disability Body mass index is 33.  Plan:  Shower with the site open TID,  redress with iodoform, heat and antibiotics for now.  He is eating a large diet on my arrival.  I don't think we will need to open it more.  I think we can just work on showers, keeping site clean and antibiotics.  We will follow with you.    Vickii Volland 09/18/2017, 8:41 AM

## 2017-09-18 NOTE — ED Provider Notes (Signed)
Nursing notes and vitals signs, including pulse oximetry, reviewed.  Summary of this visit's results, reviewed by myself:  EKG:  EKG Interpretation  Date/Time:  Thursday September 17 2017 19:04:00 EDT Ventricular Rate:  91 PR Interval:  162 QRS Duration: 104 QT Interval:  330 QTC Calculation: 405 R Axis:   -39 Text Interpretation:  Normal sinus rhythm Left axis deviation Cannot rule out Anterior infarct , age undetermined Abnormal ECG No significant change since last tracing Confirmed by Addison Lank [phone]) on 09/17/2017 7:17:03 PM       Labs:  Results for orders placed or performed during the hospital encounter of 09/17/17 (from the past 24 hour(s))  Rapid Strep Screen ([facility] ONLY)     Status: None   Collection Time: 09/17/17  8:11 PM  Result Value Ref Range   Streptococcus, Group A Screen (Direct) NEGATIVE NEGATIVE  Urinalysis, Routine w reflex microscopic     Status: None   Collection Time: 09/17/17  9:03 PM  Result Value Ref Range   Color, Urine YELLOW YELLOW   APPearance CLEAR CLEAR   Specific Gravity, Urine 1.010 1.005 - 1.030   pH 5.5 5.0 - 8.0   Glucose, UA NEGATIVE NEGATIVE mg/dL   Hgb urine dipstick NEGATIVE NEGATIVE   Bilirubin Urine NEGATIVE NEGATIVE   Ketones, ur NEGATIVE NEGATIVE mg/dL   Protein, ur NEGATIVE NEGATIVE mg/dL   Nitrite NEGATIVE NEGATIVE   Leukocytes, UA NEGATIVE NEGATIVE  CBC with Differential/Platelet     Status: Abnormal   Collection Time: 09/17/17  9:22 PM  Result Value Ref Range   WBC 8.9 4.0 - 10.5 K/uL   RBC 3.63 (L) 4.22 - 5.81 MIL/uL   Hemoglobin 10.7 (L) 13.0 - 17.0 g/dL   HCT 32.9 (L) 39.0 - 52.0 %   MCV 90.6 78.0 - 100.0 fL   MCH 29.5 26.0 - 34.0 pg   MCHC 32.5 30.0 - 36.0 g/dL   RDW 13.9 11.5 - 15.5 %   Platelets 277 150 - 400 K/uL   Neutrophils Relative % 62 %   Neutro Abs 5.5 1.7 - 7.7 K/uL   Lymphocytes Relative 27 %   Lymphs Abs 2.4 0.7 - 4.0 K/uL   Monocytes Relative 10 %   Monocytes Absolute 0.9 0.1 - 1.0 K/uL   Eosinophils Relative 1 %   Eosinophils Absolute 0.1 0.0 - 0.7 K/uL   Basophils Relative 0 %   Basophils Absolute 0.0 0.0 - 0.1 K/uL  Basic metabolic panel     Status: Abnormal   Collection Time: 09/17/17  9:22 PM  Result Value Ref Range   Sodium 137 135 - 145 mmol/L   Potassium 3.6 3.5 - 5.1 mmol/L   Chloride 103 101 - 111 mmol/L   CO2 23 22 - 32 mmol/L   Glucose, Bld 128 (H) 65 - 99 mg/dL   BUN 20 6 - 20 mg/dL   Creatinine, Ser 1.55 (H) 0.61 - 1.24 mg/dL   Calcium 8.9 8.9 - 10.3 mg/dL   GFR calc non Af Amer 50 (L) >60 mL/min   GFR calc Af Amer 58 (L) >60 mL/min   Anion gap 11 5 - 15  I-Stat venous blood gas, ED     Status: Abnormal   Collection Time: 09/17/17  9:27 PM  Result Value Ref Range   pH, Ven 7.436 (H) 7.250 - 7.430   pCO2, Ven 41.6 (L) 44.0 - 60.0 mmHg   pO2, Ven 72.0 (H) 32.0 - 45.0 mmHg   Bicarbonate 27.6 20.0 -  28.0 mmol/L   TCO2 29 22 - 32 mmol/L   O2 Saturation 93.0 %   Acid-Base Excess 4.0 (H) 0.0 - 2.0 mmol/L   Patient temperature 101.9 F    Collection site IV START    Drawn by Nurse    Sample type VENOUS   I-Stat CG4 Lactic Acid, ED     Status: None   Collection Time: 09/17/17  9:34 PM  Result Value Ref Range   Lactic Acid, Venous 1.03 0.5 - 1.9 mmol/L    Imaging Studies: Dg Chest 2 View  Result Date: 09/17/2017 CLINICAL DATA:  Fever and chest pain EXAM: CHEST - 2 VIEW COMPARISON:  04/24/2016 FINDINGS: Left-sided pacing device as before. No significant effusion. Cardiomegaly. No focal airspace disease. No pneumothorax. IMPRESSION: No active cardiopulmonary disease.  Stable cardiomegaly. Electronically Signed   By: Donavan Foil M.D.   On: 09/17/2017 23:25   Ct Abdomen Pelvis W Contrast  Result Date: 09/18/2017 CLINICAL DATA:  Left inguinal boil x2 days with fever. Left bump on the buttock for 2 years. EXAM: CT ABDOMEN AND PELVIS WITH CONTRAST TECHNIQUE: Multidetector CT imaging of the abdomen and pelvis was performed using the standard protocol  following bolus administration of intravenous contrast. CONTRAST:  185mL ISOVUE-300 IOPAMIDOL (ISOVUE-300) INJECTION 61% COMPARISON:  08/06/2008 CT abdomen and pelvis, lumbar spine CT 10/24/2016 FINDINGS: Lower chest: Mild cardiomegaly without pericardial effusion. Right ventricular ICD lead in place. There is subsegmental atelectasis at the lung bases. Hepatobiliary: No focal liver abnormality is seen. Status post cholecystectomy. No biliary dilatation. Pancreas: Unremarkable. No pancreatic ductal dilatation or surrounding inflammatory changes. Spleen: Normal in size without focal abnormality. Adrenals/Urinary Tract: Bilateral adrenal nodules measuring 13 mm in the right and 22 mm on the left with low Hounsfield units compatible with benign adenomas. Stable finding on the right since 2010 and slight interval enlargement on left since prior. Symmetric enhancement of both kidneys without obstructive uropathy. 1 cm cyst in the interpolar left kidney not well visualized on the unenhanced study from 2010. Unremarkable urinary bladder and ureters. Stomach/Bowel: Small hiatal hernia. Decompressed stomach normal small bowel rotation. No bowel obstruction or inflammation. Normal appendix. Moderate stool burden within the colon with scattered colonic diverticulosis. No diverticulitis. Vascular/Lymphatic: No aortic aneurysm. Mild atherosclerosis of the common iliac arteries bilaterally along the proximal aspect. No adenopathy. Reproductive: Mild prostatomegaly with the prostate measuring 5 cm in diameter. Other: Left inguinal hernia containing a segment of sigmoid colon and properitoneal fat without bowel obstruction. CT findings to correspond with the reported bump on the patient's left buttock. Musculoskeletal: No acute or significant osseous findings. IMPRESSION: 1. Nonobstructing left inguinal hernia containing properitoneal fat and a short segment of sigmoid colon. 2. Mild cardiomegaly. 3. Bilateral hypodense adrenal  nodules compatible with benign adenomas measuring 13 mm on the right and 22 mm on the left. 4. Interpolar 1 cm left renal cyst.  No obstructive uropathy. 5. Mild prostatomegaly. Electronically Signed   By: Ashley Royalty M.D.   On: 09/18/2017 00:41   We will have patient admitted to hospitalist service at [facility] for IV antibiotic treatment of his cellulitis.   Jayleen Scaglione, Jenny Reichmann, MD 09/18/17 [phone]

## 2017-09-18 NOTE — Plan of Care (Signed)
Plan of care discussed.   

## 2017-09-18 NOTE — H&P (Signed)
History and Physical    [name]:[id number] DOB: [birth year] DOA: 09/17/2017  PCP: [name], MD  Patient coming from: home  I have personally briefly reviewed patient's old medical records in [place]  Chief Complaint: abscess  HPI: [name] is [name] 52 y.o. male with medical history significant of heart failure with reduced ejection fraction with ICD placement, type 2 diabetes, hypertension, reflux and several other medical problems presenting with [name] boil in his left groin.   He notes his symptoms started on Wednesday.  He noticed [name] boil at that time which was painful and draining pus.  He notes the pain was sharp and came to get went.  He noted fevers and chills as well.  He presented to [place] for worsening symptoms.  There he was I&D by the ED and called for admission to [facility] for IV antibiotics.  He denies SOB, abdominal pain, nausea/vomting.  He notes he also has an inguinal hernia on L which he's been planning on following up with surgery.   ED Course: I&D, labs, IVF, abx.  Admit to hospitalist for IV abx.  Review of Systems: As per HPI otherwise 10 point review of systems negative.   Past Medical History:  Diagnosis Date  . Cardiomyopathy ([place])    Abella Shugart. 07/2013 Echo: EF 20-25%, diff HK, Gr2 DD, mild MR, mod dil LA/RA.  Marland Kitchen CHF (congestive heart failure) ([place])   . Chronic bronchitis ([place])    "get it most q yr" (12/23/2013)  . Chronic systolic dysfunction of left ventricle   . Fracture of left humerus    Tiran Sauseda. 07/2013.  Marland Kitchen GERD (gastroesophageal reflux disease)    not at present time  . Heart murmur    "born w/it"   . History of renal calculi   . Hypertension   . Kidney stones   . Obesity   . 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 Syncope   . Type II diabetes mellitus ([place]) 2006    Past Surgical History:    Procedure Laterality Date  . CARDIAC CATHETERIZATION  08/10/13  . CHOLECYSTECTOMY  ~ 2010  . IMPLANTABLE CARDIOVERTER DEFIBRILLATOR IMPLANT  12/23/2013   STJ Fortify ICD implanted by [name] for cardiomyopathy and syncope  . IMPLANTABLE CARDIOVERTER DEFIBRILLATOR IMPLANT N/Keithon Mccoin 12/23/2013   Procedure: IMPLANTABLE CARDIOVERTER DEFIBRILLATOR IMPLANT;  Surgeon: Coralyn Mark, MD;  Location: [place] CATH LAB;  Service: Cardiovascular;  Laterality: N/Syna Gad;  . LEFT HEART CATHETERIZATION WITH CORONARY ANGIOGRAM N/Analise Glotfelty 08/10/2013   Procedure: LEFT HEART CATHETERIZATION WITH CORONARY ANGIOGRAM;  Surgeon: Jettie Booze, MD;  Location[facility] C[facility] LAB;  Service: Cardiovascular;  Laterality: N/Shayona Hibbitts;  . ORIF HUMERUS FRACTURE Left 08/12/2013   Procedure: OPEN REDUCTION INTERNAL FIXATION (ORIF) HUMERAL SHAFT FRACTURE;  Surgeon: Newt Minion, MD;  Location: [place];  Service: Orthopedics;  Laterality: Left;  Open Reduction Internal Fixation Left Humerus  . URETEROSCOPY     "laser for kidney stones"     reports that he quit smoking about 3 years ago. His smoking use included cigarettes. He has [name] 28.00 pack-year smoking history. He has never used smokeless tobacco. He reports that he drinks alcohol. He reports that he has current or past drug history. Drug: Marijuana.  Allergies  Allergen Reactions  . Bee Venom Anaphylaxis    Family History  Problem Relation Age of Onset  .  Diabetes Father   . Arthritis Father   . Hypertension Father   . Diabetes Mother   . Arthritis Mother   . Hypertension Mother   . Hypertension Brother    Prior to Admission medications   Medication Sig Start Date End Date Taking? Authorizing Provider  ACCU-CHEK AVIVA PLUS test strip USE ONE STRIP THREE TIMES DAILY 11/06/15  Yes Funches, Josalyn, MD  aspirin 81 MG tablet Take 81 mg by mouth daily.   Yes [provider]  atorvastatin (LIPITOR) 10 MG tablet Take 1 tablet (10 mg total) daily by mouth. 04/18/17  Yes [name], MD   Blood Glucose Monitoring Suppl (ACCU-CHEK AVIVA PLUS) W/DEVICE KIT 1 Device by Does not apply route 3 (three) times daily after meals. 10/20/14  Yes Funches, Adriana Mccallum, MD  carvedilol (COREG) 25 MG tablet TAKE ONE TABLET BY MOUTH TWICE DAILY 03/30/17  Yes Lelon Perla, MD  furosemide (LASIX) 80 MG tablet TAKE 1 TABLET BY MOUTH TWICE DAILY 08/13/17  Yes Crenshaw, Denice Bors, MD  gabapentin (NEURONTIN) 300 MG capsule TAKE ONE CAPSULE BY MOUTH THREE TIMES DAILY 01/05/17  Yes Regal, Tamala Fothergill, DPM  JANUVIA 100 MG tablet TAKE ONE TABLET BY MOUTH ONCE DAILY 09/29/16  Yes Funches, Josalyn, MD  KLOR-CON M20 20 MEQ tablet TAKE 1 TABLET BY MOUTH ONCE DAILY 08/13/17  Yes Lelon Perla, MD  Lancet Devices [facility]) lancets 1 each by Other route 3 (three) times daily. 10/20/14  Yes Funches, Josalyn, MD  metFORMIN (GLUCOPHAGE) 1000 MG tablet TAKE 1 TABLET BY MOUTH ONCE DAILY IN THE MORNING AND 1 & 1/2 (ONE & ONE-HALF) ONCE DAILY IN THE EVENING 05/27/17  Yes [name], MD  pantoprazole (PROTONIX) 40 MG tablet TAKE 1 TABLET BY MOUTH ONCE DAILY 08/13/17  Yes [name], MD  sacubitril-valsartan (ENTRESTO) 24-26 MG Take 1 tablet by mouth 2 (two) times daily. KEEP OV. 08/17/17  Yes Lelon Perla, MD  tamsulosin (FLOMAX) 0.4 MG CAPS capsule Take by mouth 1 capsule 1 hr after breakfast for two weeks, then increase to 0.8 mg daily 08/24/17  Yes [name], MD  cyclobenzaprine (FLEXERIL) 5 MG tablet TAKE 1 TABLET BY MOUTH THREE TIMES DAILY AS NEEDED FOR MUSCLE SPASM 03/13/17   Tresa Garter, MD  Elastic Bandages & Supports (LUMBAR BACK BRACE/SUPPORT PAD) MISC 1 each by Does not apply route daily. ICD 10 M54.5 Patient not taking: Reported on 07/24/2017 07/21/16   Boykin Nearing, MD  gabapentin (NEURONTIN) 300 MG capsule 300 mg in the morning, 300 mg in afternoon and 600 mg in evening Patient not taking: Reported on 09/18/2017 12/17/15   Boykin Nearing, MD  levalbuterol [facility] HFA) 45  MCG/ACT inhaler Inhale 1-2 puffs into the lungs every 6 (six) hours as needed for wheezing. Patient not taking: Reported on 07/24/2017 02/28/16   de [place], Mike Gip, MD    Physical Exam: Vitals:   09/18/17 0100 09/18/17 0200 09/18/17 0351 09/18/17 0358  BP: 119/76 119/75 121/76 121/76  Pulse: 82  69 69  Resp:    18  Temp:  98.9 F (37.2 C) 98.5 F (36.9 C) 98.5 F (36.9 C)  TempSrc:   Oral Oral  SpO2: 97%  98% 98%  Weight:    114.8 kg (253 lb)  Height:    '6\' 1"'$  (1.854 m)    Constitutional: NAD, calm, comfortable Vitals:   09/18/17 0100 09/18/17 0200 09/18/17 0351 09/18/17 0358  BP: 119/76 119/75 121/76 121/76  Pulse:  82  69 69  Resp:    18  Temp:  98.9 F (37.2 C) 98.5 F (36.9 C) 98.5 F (36.9 C)  TempSrc:   Oral Oral  SpO2: 97%  98% 98%  Weight:    114.8 kg (253 lb)  Height:    '6\' 1"'$  (1.854 m)   Eyes: PERRL, lids and conjunctivae normal ENMT: Mucous membranes are moist. Posterior pharynx clear of any exudate or lesions.Normal dentition.  Neck: normal, supple, no masses, no thyromegaly Respiratory: clear to auscultation bilaterally, no wheezing, no crackles. Normal respiratory effort. No accessory muscle use.  Cardiovascular: Regular rate and rhythm, no murmurs / rubs / gallops. No extremity edema. 2+ pedal pulses. No carotid bruits.  Abdomen: no tenderness, no masses palpated. No hepatosplenomegaly. Bowel sounds positive.  Musculoskeletal: no clubbing / cyanosis. No joint deformity upper and lower extremities. Good ROM, no contractures. Normal muscle tone.  Skin: L groin with ~4 cm area of induration with packed incision s/p I&D draining pus, no appreciable fluctuance Neurologic: CN 2-12 grossly intact. Sensation intact, DTR normal. Strength 5/5 in all 4.  Psychiatric: Normal judgment and insight. Alert and oriented x 3. Normal mood.   Labs on Admission: I have personally reviewed following labs and imaging studies  CBC: Recent Labs  Lab 09/17/17 2122  WBC 8.9    NEUTROABS 5.5  HGB 10.7*  HCT 32.9*  MCV 90.6  PLT 416   Basic Metabolic Panel: Recent Labs  Lab 09/17/17 2122  NA 137  K 3.6  CL 103  CO2 23  GLUCOSE 128*  BUN 20  CREATININE 1.55*  CALCIUM 8.9   GFR: Estimated Creatinine Clearance: 74 mL/min (Hector Venne) (by C-G formula based on SCr of 1.55 mg/dL (H)). Liver Function Tests: No results for input(s): AST, ALT, ALKPHOS, BILITOT, PROT, ALBUMIN in the last 168 hours. No results for input(s): LIPASE, AMYLASE in the last 168 hours. No results for input(s): AMMONIA in the last 168 hours. Coagulation Profile: No results for input(s): INR, PROTIME in the last 168 hours. Cardiac Enzymes: No results for input(s): CKTOTAL, CKMB, CKMBINDEX, TROPONINI in the last 168 hours. BNP (last 3 results) No results for input(s): PROBNP in the last 8760 hours. HbA1C: No results for input(s): HGBA1C in the last 72 hours. CBG: Recent Labs  Lab 09/18/17 0235  GLUCAP 149*   Lipid Profile: No results for input(s): CHOL, HDL, LDLCALC, TRIG, CHOLHDL, LDLDIRECT in the last 72 hours. Thyroid Function Tests: No results for input(s): TSH, T4TOTAL, FREET4, T3FREE, THYROIDAB in the last 72 hours. Anemia Panel: No results for input(s): VITAMINB12, FOLATE, FERRITIN, TIBC, IRON, RETICCTPCT in the last 72 hours. Urine analysis:    Component Value Date/Time   COLORURINE YELLOW 09/17/2017 2103   APPEARANCEUR CLEAR 09/17/2017 2103   LABSPEC 1.010 09/17/2017 2103   PHURINE 5.5 09/17/2017 2103   GLUCOSEU NEGATIVE 09/17/2017 2103   HGBUR NEGATIVE 09/17/2017 2103   BILIRUBINUR NEGATIVE 09/17/2017 2103   BILIRUBINUR negative 10/20/2014 [place] 09/17/2017 2103   PROTEINUR NEGATIVE 09/17/2017 2103   UROBILINOGEN 0.2 10/20/2014 1532   UROBILINOGEN 1.0 06/20/2013 1032   NITRITE NEGATIVE 09/17/2017 2103   LEUKOCYTESUR NEGATIVE 09/17/2017 2103    Radiological Exams on Admission: Dg Chest 2 View  Result Date: 09/17/2017 CLINICAL DATA:  Fever  and chest pain EXAM: CHEST - 2 VIEW COMPARISON:  04/24/2016 FINDINGS: Left-sided pacing device as before. No significant effusion. Cardiomegaly. No focal airspace disease. No pneumothorax. IMPRESSION: No active cardiopulmonary disease.  Stable cardiomegaly. Electronically  Signed   By: Donavan Foil M.D.   On: 09/17/2017 23:25   Ct Abdomen Pelvis W Contrast  Result Date: 09/18/2017 CLINICAL DATA:  Left inguinal boil x2 days with fever. Left bump on the buttock for 2 years. EXAM: CT ABDOMEN AND PELVIS WITH CONTRAST TECHNIQUE: Multidetector CT imaging of the abdomen and pelvis was performed using the standard protocol following bolus administration of intravenous contrast. CONTRAST:  144m ISOVUE-300 IOPAMIDOL (ISOVUE-300) INJECTION 61% COMPARISON:  08/06/2008 CT abdomen and pelvis, lumbar spine CT 10/24/2016 FINDINGS: Lower chest: Mild cardiomegaly without pericardial effusion. Right ventricular ICD lead in place. There is subsegmental atelectasis at the lung bases. Hepatobiliary: No focal liver abnormality is seen. Status post cholecystectomy. No biliary dilatation. Pancreas: Unremarkable. No pancreatic ductal dilatation or surrounding inflammatory changes. Spleen: Normal in size without focal abnormality. Adrenals/Urinary Tract: Bilateral adrenal nodules measuring 13 mm in the right and 22 mm on the left with low Hounsfield units compatible with benign adenomas. Stable finding on the right since 2010 and slight interval enlargement on left since prior. Symmetric enhancement of both kidneys without obstructive uropathy. 1 cm cyst in the interpolar left kidney not well visualized on the unenhanced study from 2010. Unremarkable urinary bladder and ureters. Stomach/Bowel: Small hiatal hernia. Decompressed stomach normal small bowel rotation. No bowel obstruction or inflammation. Normal appendix. Moderate stool burden within the colon with scattered colonic diverticulosis. No diverticulitis. Vascular/Lymphatic: No  aortic aneurysm. Mild atherosclerosis of the common iliac arteries bilaterally along the proximal aspect. No adenopathy. Reproductive: Mild prostatomegaly with the prostate measuring 5 cm in diameter. Other: Left inguinal hernia containing Storey Stangeland segment of sigmoid colon and properitoneal fat without bowel obstruction. CT findings to correspond with the reported bump on the patient's left buttock. Musculoskeletal: No acute or significant osseous findings. IMPRESSION: 1. Nonobstructing left inguinal hernia containing properitoneal fat and Azai Gaffin short segment of sigmoid colon. 2. Mild cardiomegaly. 3. Bilateral hypodense adrenal nodules compatible with benign adenomas measuring 13 mm on the right and 22 mm on the left. 4. Interpolar 1 cm left renal cyst.  No obstructive uropathy. 5. Mild prostatomegaly. Electronically Signed   By: DAshley RoyaltyM.D.   On: 09/18/2017 00:41    EKG: Independently reviewed. Appears similar to priors.  NSR.  LAD.   Assessment/Plan Active Problems:   Cellulitis   Abscess  Fever  L inguinal abscess s/p I&D: Fever, otherwise hemodynamically stable.   Started on clinda, will transition to augmentin and doxy No wound cultures sent Follow blood cx Will c/s surgery to ensure no further I&D needed  Sore Throat: with fever and he also describes some myalgias, which could be related 2/2 above, but will follow influenza.  Negative strep swab in ED.  GAS culture pending.  HFrEF:  EF 471% diastolic dysfunction on 86/9678echo.  ICD.  Euvolemic.  He has unchanged chronic CP (none currently). Enteresto Lasix Coreg  T2DM:  SSI, hold home agents ASA, atorastatin gabapentin  CKD stage III: Follow  L inguinal Hernia: needs outpatient follow up  BPH: flomax  Anemia: follow, possibly dilutional with IVF given in ED  DVT prophylaxis: lovenox  Code Status: full  Family Communication: none at bedside  Disposition Plan: hopefully d/c tomorrow Consults called: surgery  Admission  status: obs   CFayrene HelperMD [facility] Pager [phone] If 7PM-7AM, please contact night-coverage [email or web address] Password [email or web address] 09/18/2017, 9:47 AM

## 2017-09-19 DIAGNOSIS — L03314 Cellulitis of groin: Secondary | ICD-10-CM | POA: Diagnosis not present

## 2017-09-19 DIAGNOSIS — L02214 Cutaneous abscess of groin: Secondary | ICD-10-CM

## 2017-09-19 LAB — GLUCOSE, CAPILLARY: Glucose-Capillary: 200 mg/dL — ABNORMAL HIGH (ref 65–99)

## 2017-09-19 LAB — CBC
HCT: 33.5 % — ABNORMAL LOW (ref 39.0–52.0)
HEMOGLOBIN: 10.7 g/dL — AB (ref 13.0–17.0)
MCH: 29.3 pg (ref 26.0–34.0)
MCHC: 31.9 g/dL (ref 30.0–36.0)
MCV: 91.8 fL (ref 78.0–100.0)
Platelets: 276 10*3/uL (ref 150–400)
RBC: 3.65 MIL/uL — AB (ref 4.22–5.81)
RDW: 14.1 % (ref 11.5–15.5)
WBC: 5.6 10*3/uL (ref 4.0–10.5)

## 2017-09-19 LAB — HIV ANTIBODY (ROUTINE TESTING W REFLEX): HIV Screen 4th Generation wRfx: NONREACTIVE

## 2017-09-19 LAB — COMPREHENSIVE METABOLIC PANEL
ALBUMIN: 3.5 g/dL (ref 3.5–5.0)
ALK PHOS: 60 U/L (ref 38–126)
ALT: 22 U/L (ref 17–63)
ANION GAP: 11 (ref 5–15)
AST: 23 U/L (ref 15–41)
BUN: 16 mg/dL (ref 6–20)
CALCIUM: 8.6 mg/dL — AB (ref 8.9–10.3)
CHLORIDE: 104 mmol/L (ref 101–111)
CO2: 23 mmol/L (ref 22–32)
Creatinine, Ser: 1.36 mg/dL — ABNORMAL HIGH (ref 0.61–1.24)
GFR calc non Af Amer: 58 mL/min — ABNORMAL LOW (ref >60)
GLUCOSE: 216 mg/dL — AB (ref 65–99)
Potassium: 4.3 mmol/L (ref 3.5–5.1)
SODIUM: 138 mmol/L (ref 135–145)
Total Bilirubin: 0.5 mg/dL (ref 0.3–1.2)
Total Protein: 7.3 g/dL (ref 6.5–8.1)

## 2017-09-19 MED ORDER — MORPHINE SULFATE (PF) 2 MG/ML IV SOLN
1.0000 mg | INTRAVENOUS | Status: DC | PRN
  Administered 2017-09-19 (×2): 1 mg via INTRAVENOUS
  Administered 2017-09-20: 11:00:00 2 mg via INTRAVENOUS
  Filled 2017-09-19 (×3): qty 1

## 2017-09-19 NOTE — Progress Notes (Signed)
PROGRESS NOTE    [name]  DJT:[id number] DOB: 1966-04-22 DOA: 09/17/2017 PCP: [name], MD    Brief Narrative: [name] is a 52 y.o. male with medical history significant of heart failure with reduced ejection fraction with ICD placement, type 2 diabetes, hypertension, reflux and several other medical problems presenting with a boil in his left groin.     Assessment & Plan:   Active Problems:   Cellulitis   Abscess   Abscess of groin  Left inguinal abscess s/p I&D Pain and swelling improved. But he is still requiring IV pain meds.  Resume ANTIBIOTICS, and if pain is controlled plan for discharge in am.  Follow up blood cultures.    Ischemic cardiomyopathy:  Resume home meds. No change in meds.    Sore throat: Swab neg for strep, cultures pendng.    Stage 3 CKD: Stable.    Anemia:  Monitor.    Type 2 DM: CBG (last 3)  Recent Labs    09/18/17 0235 09/19/17 0533  GLUCAP 149* 200*   Resume SSI.    BPH:  Resume flomax.     DVT prophylaxis: lovenox.  Code Status: full code.  Family Communication: none at bedside.  Disposition Plan: possibly home in am.    Consultants:   None.    Procedures: s/p I&D   Antimicrobials: augmentin.    Subjective: Requesting pain meds, has vomiting and nausea with dilaudid.   Objective: Vitals:   09/19/17 0425 09/19/17 0500 09/19/17 0904 09/19/17 1345  BP: (!) 103/59  126/75 128/70  Pulse: 66  66 68  Resp: 20   16  Temp: 97.6 F (36.4 C)   (!) 97.5 F (36.4 C)  TempSrc: Oral   Oral  SpO2: 95%   98%  Weight:  114.1 kg (251 lb 8.7 oz)    Height:        Intake/Output Summary (Last 24 hours) at 09/19/2017 1822 Last data filed at 09/19/2017 1345 Gross per 24 hour  Intake 480 ml  Output -  Net 480 ml   Filed Weights   09/17/17 1858 09/18/17 0358 09/19/17 0500  Weight: 114.8 kg (253 lb) 114.8 kg (253 lb) 114.1 kg (251 lb 8.7 oz)    Examination:  General exam: Appears calm and  comfortable  Respiratory system: Clear to auscultation. Respiratory effort normal. Cardiovascular system: S1 & S2 heard, RRR. No JVD, murmurs, rubs, gallops or clicks. No pedal edema. Gastrointestinal system: Abdomen is nondistended, soft and nontender. No organomegaly or masses felt. Normal bowel sounds heard. Central nervous system: Alert and oriented. No focal neurological deficits. Extremities: BANDAGE over the left groin. Pain and swelling has improved.  Skin: No rashes, lesions or ulcers Psychiatry: Judgement and insight appear normal. Mood & affect appropriate.     Data Reviewed: I have personally reviewed following labs and imaging studies  CBC: Recent Labs  Lab 09/17/17 2122 09/18/17 1025 09/19/17 0521  WBC 8.9 6.0 5.6  NEUTROABS 5.5  --   --   HGB 10.7* 10.2* 10.7*  HCT 32.9* 31.4* 33.5*  MCV 90.6 91.8 91.8  PLT 277 258 300   Basic Metabolic Panel: Recent Labs  Lab 09/17/17 2122 09/18/17 1025 09/19/17 0521  NA 137  --  138  K 3.6  --  4.3  CL 103  --  104  CO2 23  --  23  GLUCOSE 128*  --  216*  BUN 20  --  16  CREATININE 1.55* 1.46* 1.36*  CALCIUM 8.9  --  8.6*   GFR: Estimated Creatinine Clearance: 84.1 mL/min (A) (by C-G formula based on SCr of 1.36 mg/dL (H)). Liver Function Tests: Recent Labs  Lab 09/19/17 0521  AST 23  ALT 22  ALKPHOS 60  BILITOT 0.5  PROT 7.3  ALBUMIN 3.5   No results for input(s): LIPASE, AMYLASE in the last 168 hours. No results for input(s): AMMONIA in the last 168 hours. Coagulation Profile: No results for input(s): INR, PROTIME in the last 168 hours. Cardiac Enzymes: No results for input(s): CKTOTAL, CKMB, CKMBINDEX, TROPONINI in the last 168 hours. BNP (last 3 results) No results for input(s): PROBNP in the last 8760 hours. HbA1C: No results for input(s): HGBA1C in the last 72 hours. CBG: Recent Labs  Lab 09/18/17 0235 09/19/17 0533  GLUCAP 149* 200*   Lipid Profile: No results for input(s): CHOL, HDL,  LDLCALC, TRIG, CHOLHDL, LDLDIRECT in the last 72 hours. Thyroid Function Tests: No results for input(s): TSH, T4TOTAL, FREET4, T3FREE, THYROIDAB in the last 72 hours. Anemia Panel: No results for input(s): VITAMINB12, FOLATE, FERRITIN, TIBC, IRON, RETICCTPCT in the last 72 hours. Sepsis Labs: Recent Labs  Lab 09/17/17 2134  LATICACIDVEN 1.03    Recent Results (from the past 240 hour(s))  Rapid Strep Screen ([facility] ONLY)     Status: None   Collection Time: 09/17/17  8:11 PM  Result Value Ref Range Status   Streptococcus, Group A Screen (Direct) NEGATIVE NEGATIVE Final    Comment: (NOTE) A Rapid Antigen test may result negative if the antigen level in the sample is below the detection level of this test. The FDA has not cleared this test as a stand-alone test therefore the rapid antigen negative result has reflexed to a Group A Strep culture. Performed at [facility], [address] [place], [place] [address]   Culture, group A strep     Status: None (Preliminary result)   Collection Time: 09/17/17  8:11 PM  Result Value Ref Range Status   Specimen Description   Final    THROAT Performed at [facility], [address] [place], [place] [address]    Special Requests   Final    NONE Performed at [facility], [address] [place], [place] [address]    Culture   Final    CULTURE REINCUBATED FOR BETTER GROWTH Performed at [facility], [place] [address], [place], [place] [address]    Report Status PENDING  Incomplete  Blood culture (routine x 2)     Status: None (Preliminary result)   Collection Time: 09/17/17  9:23 PM  Result Value Ref Range Status   Specimen Description   Final    BLOOD RIGHT ANTECUBITAL Performed at [facility], [address] [place], [place] [address]    Special Requests   Final    BOTTLES DRAWN AEROBIC AND ANAEROBIC Blood Culture adequate volume Performed at [facility], [address] [place], [place] [address]    Culture   Final    NO GROWTH 1 DAY Performed at [facility], [place] [address], [place], [place] [address]    Report Status PENDING  Incomplete  Blood culture (routine x 2)     Status: None (Preliminary result)   Collection Time: 09/18/17 12:40 AM  Result Value Ref Range Status   Specimen Description   Final    BLOOD LEFT HAND  Performed at [facility], [address] [place], [place] [address]    Special Requests   Final    BOTTLES DRAWN AEROBIC AND ANAEROBIC Blood Culture adequate volume Performed at [facility], [address] [place], [place] [address]    Culture   Final    NO GROWTH 1 DAY Performed at [facility], [place] [address] [place], [place] [address]    Report Status PENDING  Incomplete         Radiology Studies: Dg Chest 2 View  Result Date: 09/17/2017 CLINICAL DATA:  Fever and chest pain EXAM: CHEST - 2 VIEW COMPARISON:  04/24/2016 FINDINGS: Left-sided pacing device as before. No significant effusion. Cardiomegaly. No focal airspace disease. No pneumothorax. IMPRESSION: No active cardiopulmonary disease.  Stable cardiomegaly. Electronically Signed   By: Donavan Foil M.D.   On: 09/17/2017 23:25   Ct Abdomen Pelvis W Contrast  Result Date: 09/18/2017 CLINICAL DATA:  Left inguinal boil x2 days with fever. Left bump on the buttock for 2 years. EXAM: CT ABDOMEN AND PELVIS WITH CONTRAST TECHNIQUE: Multidetector CT imaging of the abdomen and pelvis was performed using the standard protocol following bolus administration of intravenous contrast. CONTRAST:  175mL ISOVUE-300 IOPAMIDOL (ISOVUE-300) INJECTION 61% COMPARISON:  08/06/2008 CT abdomen and pelvis, lumbar spine CT 10/24/2016 FINDINGS: Lower chest: Mild cardiomegaly without pericardial effusion. Right ventricular ICD lead in place. There is subsegmental atelectasis at the lung bases. Hepatobiliary: No focal liver abnormality is seen. Status post  cholecystectomy. No biliary dilatation. Pancreas: Unremarkable. No pancreatic ductal dilatation or surrounding inflammatory changes. Spleen: Normal in size without focal abnormality. Adrenals/Urinary Tract: Bilateral adrenal nodules measuring 13 mm in the right and 22 mm on the left with low Hounsfield units compatible with benign adenomas. Stable finding on the right since 2010 and slight interval enlargement on left since prior. Symmetric enhancement of both kidneys without obstructive uropathy. 1 cm cyst in the interpolar left kidney not well visualized on the unenhanced study from 2010. Unremarkable urinary bladder and ureters. Stomach/Bowel: Small hiatal hernia. Decompressed stomach normal small bowel rotation. No bowel obstruction or inflammation. Normal appendix. Moderate stool burden within the colon with scattered colonic diverticulosis. No diverticulitis. Vascular/Lymphatic: No aortic aneurysm. Mild atherosclerosis of the common iliac arteries bilaterally along the proximal aspect. No adenopathy. Reproductive: Mild prostatomegaly with the prostate measuring 5 cm in diameter. Other: Left inguinal hernia containing a segment of sigmoid colon and properitoneal fat without bowel obstruction. CT findings to correspond with the reported bump on the patient's left buttock. Musculoskeletal: No acute or significant osseous findings. IMPRESSION: 1. Nonobstructing left inguinal hernia containing properitoneal fat and a short segment of sigmoid colon. 2. Mild cardiomegaly. 3. Bilateral hypodense adrenal nodules compatible with benign adenomas measuring 13 mm on the right and 22 mm on the left. 4. Interpolar 1 cm left renal cyst.  No obstructive uropathy. 5. Mild prostatomegaly. Electronically Signed   By: Ashley Royalty M.D.   On: 09/18/2017 00:41        Scheduled Meds: . amoxicillin-clavulanate  1 tablet Oral Q12H  . aspirin EC  81 mg Oral Daily  . atorvastatin  10 mg Oral Daily  . carvedilol  25 mg Oral BID  WC  . doxycycline  100 mg Oral Q12H  . enoxaparin (LOVENOX) injection  60 mg Subcutaneous Q24H  . furosemide  80 mg Oral Daily  . gabapentin  300 mg Oral Daily   And  . gabapentin  600  mg Oral QHS  . pantoprazole  40 mg Oral Daily  . potassium chloride SA  20 mEq Oral Daily  . sacubitril-valsartan  1 tablet Oral BID  . tamsulosin  0.8 mg Oral Daily   Continuous Infusions:   LOS: 0 days    Time spent: [place]    Hosie Poisson, MD [facility] Pager [phone] If 7PM-7AM, please contact night-coverage [email or web address] Password [facility] 09/19/2017, 6:22 PM

## 2017-09-20 DIAGNOSIS — L03314 Cellulitis of groin: Secondary | ICD-10-CM

## 2017-09-20 DIAGNOSIS — L02214 Cutaneous abscess of groin: Secondary | ICD-10-CM | POA: Diagnosis not present

## 2017-09-20 LAB — CULTURE, GROUP A STREP (THRC)

## 2017-09-20 MED ORDER — OXYCODONE HCL 5 MG PO TABS: 5.0000 mg | ORAL_TABLET | Freq: Four times a day (QID) | ORAL | 0 refills | Status: DC | PRN

## 2017-09-20 MED ORDER — DOXYCYCLINE HYCLATE 100 MG PO TABS: 100.0000 mg | ORAL_TABLET | Freq: Two times a day (BID) | ORAL | 0 refills | Status: DC

## 2017-09-20 MED ORDER — AMOXICILLIN-POT CLAVULANATE 875-125 MG PO TABS: 1.0000 | ORAL_TABLET | Freq: Two times a day (BID) | ORAL | 0 refills | Status: DC

## 2017-09-20 NOTE — Progress Notes (Signed)
Pt to d/c home. AVS reviewed and "My Chart" discussed with pt. Pt capable of verbalizing medications, dressing changes, signs and symptoms of infection, and follow-up appointments. Remains hemodynamically stable. No signs and symptoms of distress. Educated pt to return to ER in the case of SOB, dizziness, or chest pain.  

## 2017-09-21 ENCOUNTER — Emergency Department (HOSPITAL_BASED_OUTPATIENT_CLINIC_OR_DEPARTMENT_OTHER)
Admission: EM | Admit: 2017-09-21 | Discharge: 2017-09-22 | Disposition: A | Discharge status: Home / Self Care | Payer: Medicaid Other | Attending: Emergency Medicine | Admitting: Emergency Medicine

## 2017-09-21 ENCOUNTER — Encounter (HOSPITAL_BASED_OUTPATIENT_CLINIC_OR_DEPARTMENT_OTHER): Payer: Self-pay | Admitting: Emergency Medicine

## 2017-09-21 ENCOUNTER — Other Ambulatory Visit: Payer: Self-pay

## 2017-09-21 ENCOUNTER — Emergency Department (HOSPITAL_BASED_OUTPATIENT_CLINIC_OR_DEPARTMENT_OTHER): Payer: Medicaid Other

## 2017-09-21 ENCOUNTER — Telehealth: Payer: Self-pay | Admitting: Cardiology

## 2017-09-21 ENCOUNTER — Encounter: Payer: Medicaid Other | Admitting: *Deleted

## 2017-09-21 DIAGNOSIS — L02214 Cutaneous abscess of groin: Secondary | ICD-10-CM | POA: Insufficient documentation

## 2017-09-21 DIAGNOSIS — F1210 Cannabis abuse, uncomplicated: Secondary | ICD-10-CM | POA: Diagnosis not present

## 2017-09-21 DIAGNOSIS — E1140 Type 2 diabetes mellitus with diabetic neuropathy, unspecified: Secondary | ICD-10-CM | POA: Diagnosis not present

## 2017-09-21 DIAGNOSIS — Z7982 Long term (current) use of aspirin: Secondary | ICD-10-CM | POA: Diagnosis not present

## 2017-09-21 DIAGNOSIS — Z7984 Long term (current) use of oral hypoglycemic drugs: Secondary | ICD-10-CM | POA: Insufficient documentation

## 2017-09-21 DIAGNOSIS — R509 Fever, unspecified: Secondary | ICD-10-CM | POA: Insufficient documentation

## 2017-09-21 DIAGNOSIS — Z79899 Other long term (current) drug therapy: Secondary | ICD-10-CM | POA: Insufficient documentation

## 2017-09-21 DIAGNOSIS — Z87891 Personal history of nicotine dependence: Secondary | ICD-10-CM | POA: Diagnosis not present

## 2017-09-21 DIAGNOSIS — L0291 Cutaneous abscess, unspecified: Secondary | ICD-10-CM | POA: Diagnosis present

## 2017-09-21 DIAGNOSIS — I5022 Chronic systolic (congestive) heart failure: Secondary | ICD-10-CM | POA: Insufficient documentation

## 2017-09-21 DIAGNOSIS — I110 Hypertensive heart disease with heart failure: Secondary | ICD-10-CM | POA: Diagnosis not present

## 2017-09-21 LAB — BASIC METABOLIC PANEL
Anion gap: 11 (ref 5–15)
BUN: 20 mg/dL (ref 6–20)
CALCIUM: 9.7 mg/dL (ref 8.9–10.3)
CO2: 24 mmol/L (ref 22–32)
CREATININE: 1.56 mg/dL — AB (ref 0.61–1.24)
Chloride: 104 mmol/L (ref 101–111)
GFR calc non Af Amer: 49 mL/min — ABNORMAL LOW (ref >60)
GFR, EST AFRICAN AMERICAN: 57 mL/min — AB (ref >60)
GLUCOSE: 118 mg/dL — AB (ref 65–99)
Potassium: 3.6 mmol/L (ref 3.5–5.1)
Sodium: 139 mmol/L (ref 135–145)

## 2017-09-21 LAB — CBC WITH DIFFERENTIAL/PLATELET
Basophils Absolute: 0 10*3/uL (ref 0.0–0.1)
Basophils Relative: 1 %
EOS PCT: 3 %
Eosinophils Absolute: 0.2 10*3/uL (ref 0.0–0.7)
HEMATOCRIT: 34 % — AB (ref 39.0–52.0)
Hemoglobin: 12.2 g/dL — ABNORMAL LOW (ref 13.0–17.0)
Lymphocytes Relative: 43 %
Lymphs Abs: 2.8 10*3/uL (ref 0.7–4.0)
MCH: 31.9 pg (ref 26.0–34.0)
MCHC: 35.9 g/dL (ref 30.0–36.0)
MCV: 88.8 fL (ref 78.0–100.0)
MONO ABS: 0.6 10*3/uL (ref 0.1–1.0)
MONOS PCT: 9 %
NEUTROS ABS: 2.9 10*3/uL (ref 1.7–7.7)
Neutrophils Relative %: 44 %
PLATELETS: 352 10*3/uL (ref 150–400)
RBC: 3.83 MIL/uL — ABNORMAL LOW (ref 4.22–5.81)
RDW: 13.8 % (ref 11.5–15.5)
WBC: 6.5 10*3/uL (ref 4.0–10.5)

## 2017-09-21 MED ORDER — IOPAMIDOL (ISOVUE-300) INJECTION 61%
100.0000 mL | Freq: Once | INTRAVENOUS | Status: AC | PRN
  Administered 2017-09-21: 100 mL via INTRAVENOUS

## 2017-09-21 MED ORDER — SODIUM CHLORIDE 0.9 % IV BOLUS
1000.0000 mL | Freq: Once | INTRAVENOUS | Status: AC
Start: 2017-09-21 — End: 2017-09-21
  Administered 2017-09-21: 1000 mL via INTRAVENOUS

## 2017-09-21 NOTE — ED Triage Notes (Signed)
Pt recently admitted for an abscess/cellulitis of groin. Pt was discharged yesterday, still taking abx. State pain is getting worse.

## 2017-09-21 NOTE — Telephone Encounter (Signed)
Spoke with pt and reminded pt of remote transmission that is due today. Pt verbalized understanding.   

## 2017-09-21 NOTE — ED Provider Notes (Signed)
[facility] EMERGENCY DEPARTMENT Provider Note   CSN: [id number] Arrival date & time: 09/21/17  1951     History   Chief Complaint Chief Complaint  Patient presents with  . Abscess    HPI [name] is a 52 y.o. male.  HPI [name] is a 52 y.o. male presents to emergency department for recheck of the left groin abscess.  Patient was admitted for left scrotal/groin abscess several days ago.  He just got discharged from the hospital yesterday.  Abscess was drained and he was started on antibiotics.  He states that he is still currently taking antibiotics but feels like his swelling is getting worse.  He also has known left inguinal hernia in the same area.  He states he is having normal bowel movements.  He denies any fever or chills although when he was admitted he did have fever.  He states that the packing that is in that area it has been coming out so he has been trying to stick it back in there which he thinks may have caused more pain.  He also states that he walk for 4 miles today which could have irritated the area.  He continues to have yellow drainage from the wound.  He is taking Percocet which is not helping.  Past Medical History:  Diagnosis Date  . Cardiomyopathy ([place])    a. 07/2013 Echo: EF 20-25%, diff HK, Gr2 DD, mild MR, mod dil LA/RA.  Marland Kitchen CHF (congestive heart failure) ([place])   . Chronic bronchitis ([place])    "get it most q yr" (12/23/2013)  . Chronic systolic dysfunction of left ventricle   . Fracture of left humerus    a. 07/2013.  Marland Kitchen GERD (gastroesophageal reflux disease)    not at present time  . Heart murmur    "born w/it"   . History of renal calculi   . Hypertension   . Kidney stones   . Obesity   . 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 Syncope   . Type II diabetes mellitus ([place]) 2006    Patient Active Problem  List   Diagnosis Date Noted  . Cellulitis 09/18/2017  . Abscess 09/18/2017  . Abscess of groin   . Unilateral inguinal hernia without obstruction or gangrene 07/24/2017  . 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  . Hypersomnia 02/28/2016  . Abnormal PFT 02/24/2016  . Diabetic polyneuropathy associated with type 2 diabetes mellitus ([place]) 12/18/2015  . Lumbar back pain 12/18/2015  . Controlled type 2 diabetes mellitus with complication, with long-term current use of insulin ([place]) 09/17/2015  . 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 Lesion of right median nerve at forearm 04/16/2015  . ICD (implantable cardioverter-defibrillator) in place 03/28/2014  . Chronic systolic congestive heart failure ([place]) 03/28/2014  . Ventricular tachycardia, non-sustained ([place]) 03/27/2014  . Nonischemic cardiomyopathy ([place]) 11/09/2013  . Chronic systolic dysfunction of left ventricle 11/09/2013  . Syncope 10/20/2013  . Chronic chest pain 07/28/2013  . 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 RENAL CALCULUS 05/13/2009  . Obesity 05/08/2009  . Former heavy cigarette smoker (20-39 per day) 05/08/2009  . HOARSENESS 05/08/2009  . ANXIETY 02/28/2008  . GERD 02/28/2008  . IRRITABLE BOWEL SYNDROME 02/28/2008    Past Surgical History:  Procedure Laterality Date  . CARDIAC CATHETERIZATION  08/10/13  . CHOLECYSTECTOMY  ~ 2010  .  IMPLANTABLE CARDIOVERTER DEFIBRILLATOR IMPLANT  12/23/2013   STJ Fortify ICD implanted by [name] for cardiomyopathy and syncope  . IMPLANTABLE CARDIOVERTER DEFIBRILLATOR IMPLANT N/A 12/23/2013   Procedure: IMPLANTABLE CARDIOVERTER DEFIBRILLATOR IMPLANT;  Surgeon: Coralyn Mark, MD;  Location: [facility] CATH LAB;  Service: Cardiovascular;  Laterality: N/A;  . LEFT HEART CATHETERIZATION WITH CORONARY ANGIOGRAM N/A 08/10/2013   Procedure: LEFT HEART CATHETERIZATION WITH CORONARY ANGIOGRAM;  Surgeon: Jettie Booze, MD;  Location: [facility] CATH LAB;  Service:  Cardiovascular;  Laterality: N/A;  . ORIF HUMERUS FRACTURE Left 08/12/2013   Procedure: OPEN REDUCTION INTERNAL FIXATION (ORIF) HUMERAL SHAFT FRACTURE;  Surgeon: Newt Minion, MD;  Location: [facility];  Service: Orthopedics;  Laterality: Left;  Open Reduction Internal Fixation Left Humerus  . URETEROSCOPY     "laser for kidney stones"        Home Medications    Prior to Admission medications   Medication Sig Start Date End Date Taking? Authorizing Provider  ACCU-CHEK AVIVA PLUS test strip USE ONE STRIP THREE TIMES DAILY 11/06/15   Boykin Nearing, MD  amoxicillin-clavulanate (AUGMENTIN) 875-125 MG tablet Take 1 tablet by mouth every 12 (twelve) hours. 09/20/17   Hosie Poisson, MD  aspirin 81 MG tablet Take 81 mg by mouth daily.    [provider]  atorvastatin (LIPITOR) 10 MG tablet Take 1 tablet (10 mg total) daily by mouth. 04/18/17   [name], MD  Blood Glucose Monitoring Suppl (ACCU-CHEK AVIVA PLUS) W/DEVICE KIT 1 Device by Does not apply route 3 (three) times daily after meals. 10/20/14   Funches, Adriana Mccallum, MD  carvedilol (COREG) 25 MG tablet TAKE ONE TABLET BY MOUTH TWICE DAILY 03/30/17   Lelon Perla, MD  cyclobenzaprine (FLEXERIL) 5 MG tablet TAKE 1 TABLET BY MOUTH THREE TIMES DAILY AS NEEDED FOR MUSCLE SPASM 03/13/17   Tresa Garter, MD  doxycycline (VIBRA-TABS) 100 MG tablet Take 1 tablet (100 mg total) by mouth every 12 (twelve) hours. 09/20/17   Hosie Poisson, MD  Elastic Bandages & Supports (LUMBAR BACK BRACE/SUPPORT PAD) MISC 1 each by Does not apply route daily. ICD 10 M54.5 Patient not taking: Reported on 07/24/2017 07/21/16   Boykin Nearing, MD  furosemide (LASIX) 80 MG tablet TAKE 1 TABLET BY MOUTH TWICE DAILY 08/13/17   Lelon Perla, MD  gabapentin (NEURONTIN) 300 MG capsule TAKE ONE CAPSULE BY MOUTH THREE TIMES DAILY 01/05/17   Regal, Tamala Fothergill, DPM  JANUVIA 100 MG tablet TAKE ONE TABLET BY MOUTH ONCE DAILY 09/29/16   Boykin Nearing, MD  KLOR-CON  M20 20 MEQ tablet TAKE 1 TABLET BY MOUTH ONCE DAILY 08/13/17   Lelon Perla, MD  Lancet Devices [facility]) lancets 1 each by Other route 3 (three) times daily. 10/20/14   Funches, Adriana Mccallum, MD  levalbuterol (XOPENEX HFA) 45 MCG/ACT inhaler Inhale 1-2 puffs into the lungs every 6 (six) hours as needed for wheezing. Patient not taking: Reported on 07/24/2017 02/28/16   de Dios, [place] A, MD  metFORMIN (GLUCOPHAGE) 1000 MG tablet TAKE 1 TABLET BY MOUTH ONCE DAILY IN THE MORNING AND 1 & 1/2 (ONE & ONE-HALF) ONCE DAILY IN THE EVENING 05/27/17   [name], MD  oxyCODONE (OXY IR/ROXICODONE) 5 MG immediate release tablet Take 1 tablet (5 mg total) by mouth every 6 (six) hours as needed for moderate pain. 09/20/17   Hosie Poisson, MD  pantoprazole (PROTONIX) 40 MG tablet TAKE 1 TABLET BY MOUTH ONCE DAILY 08/13/17   [name],  MD  sacubitril-valsartan (ENTRESTO) 24-26 MG Take 1 tablet by mouth 2 (two) times daily. KEEP OV. 08/17/17   Lelon Perla, MD  tamsulosin (FLOMAX) 0.4 MG CAPS capsule Take by mouth 1 capsule 1 hr after breakfast for two weeks, then increase to 0.8 mg daily 08/24/17   [name], MD    Family History Family History  Problem Relation Age of Onset  . Diabetes Father   . Arthritis Father   . Hypertension Father   . Diabetes Mother   . Arthritis Mother   . Hypertension Mother   . Hypertension Brother     Social History Social History   Tobacco Use  . Smoking status: Former Smoker    Packs/day: 1.00    Years: 28.00    Pack years: 28.00    Types: Cigarettes    Last attempt to quit: 06/03/2014    Years since quitting: 3.3  . Smokeless tobacco: Never Used  Substance Use Topics  . Alcohol use: Yes    Alcohol/week: 0.0 oz    Comment: 12/23/2013 "might drink a couple times/yr"  . Drug use: Yes    Types: Marijuana     Allergies   Bee venom   Review of Systems Review of Systems  Constitutional: Negative for chills and fever.    Respiratory: Negative for cough, chest tightness and shortness of breath.   Cardiovascular: Negative for chest pain, palpitations and leg swelling.  Gastrointestinal: Negative for abdominal pain, constipation and diarrhea.  Genitourinary: Positive for scrotal swelling. Negative for discharge, dysuria, frequency, hematuria, penile pain, penile swelling and urgency.  Musculoskeletal: Negative for arthralgias, myalgias, neck pain and neck stiffness.  Skin: Positive for color change and wound. Negative for rash.  Allergic/Immunologic: Negative for immunocompromised state.  All other systems reviewed and are negative.    Physical Exam Updated Vital Signs BP 125/86 (BP Location: Right Arm)   Pulse 91   Temp 98.9 F (37.2 C) (Oral)   Resp 16   Ht 6' 1"  (1.854 m)   Wt 114.3 kg (252 lb)   SpO2 99%   BMI 33.25 kg/m   Physical Exam  Constitutional: He appears well-developed and well-nourished. No distress.  HENT:  Head: Normocephalic and atraumatic.  Eyes: Conjunctivae are normal.  Neck: Neck supple.  Cardiovascular: Normal rate, regular rhythm and normal heart sounds.  Pulmonary/Chest: Effort normal. No respiratory distress. He has no wheezes. He has no rales.  Abdominal: Soft. Bowel sounds are normal. He exhibits no distension. There is no tenderness. There is no rebound.  Left inguinal hernia, unable to assess if it is easily reducible due to overlying abscess and cellulitis.  There is a 1 cm incision to the left inguinal area adjacent to the scrotum with packing in place and purulent drainage.  There is surrounding erythema extending into the left scrotum and in the left inguinal area concerning for cellulitis  Musculoskeletal: He exhibits no edema.  Neurological: He is alert.  Skin: Skin is warm and dry.  Nursing note and vitals reviewed.    ED Treatments / Results  Labs (all labs ordered are listed, but only abnormal results are displayed) Labs Reviewed  CBC WITH  DIFFERENTIAL/PLATELET - Abnormal; Notable for the following components:      Result Value   RBC 3.83 (*)    Hemoglobin 12.2 (*)    HCT 34.0 (*)    All other components within normal limits  BASIC METABOLIC PANEL - Abnormal; Notable for the following components:   Glucose,  Bld 118 (*)    Creatinine, Ser 1.56 (*)    GFR calc non Af Amer 49 (*)    GFR calc Af Amer 57 (*)    All other components within normal limits    EKG None  Radiology Ct Pelvis W Contrast  Result Date: 09/21/2017 CLINICAL DATA:  Assess known left inguinal abscess. Persistent left inguinal pain. EXAM: CT PELVIS WITH CONTRAST TECHNIQUE: Multidetector CT imaging of the pelvis was performed using the standard protocol following the bolus administration of intravenous contrast. CONTRAST:  159m ISOVUE-300 IOPAMIDOL (ISOVUE-300) INJECTION 61% COMPARISON:  CT of the abdomen and pelvis performed 09/17/2017 FINDINGS: Urinary Tract: The bladder is mildly distended and grossly unremarkable. Bowel: Appendicoliths are noted within the appendix. The appendix is unremarkable in appearance. Visualized small and large bowel loops are grossly unremarkable. Vascular/Lymphatic: The visualized vasculature is grossly unremarkable in appearance. No retroperitoneal or pelvic sidewall lymphadenopathy is seen. Reproductive:  The prostate remains normal in size. Other: A moderate left inguinal hernia is noted, with herniation of a segment of sigmoid colon into the hernia. There is no evidence for obstruction. The previously noted left scrotal subcutaneous abscess has been drained, with minimal associated air at the site of prior abscess. Mild skin thickening is noted at the left side of the scrotum. Musculoskeletal: No acute osseous abnormalities are identified. The visualized musculature is unremarkable in appearance. IMPRESSION: 1. Previously noted left scrotal subcutaneous abscess has been drained, with minimal associated air at the site of prior  abscess. Mild skin thickening at the left side of the scrotum. 2. Moderate left inguinal hernia, with herniation of a segment of sigmoid colon into the hernia. No evidence for obstruction. Electronically Signed   By: JGarald BaldingM.D.   On: 09/21/2017 23:46    Procedures Procedures (including critical care time)  Medications Ordered in ED Medications  sodium chloride 0.9 % bolus 1,000 mL (1,000 mLs Intravenous New Bag/Given 09/21/17 2243)  iopamidol (ISOVUE-300) 61 % injection 100 mL (100 mLs Intravenous Contrast Given 09/21/17 2339)     Initial Impression / Assessment and Plan / ED Course  I have reviewed the triage vital signs and the nursing notes.  Pertinent labs & imaging results that were available during my care of the patient were reviewed by me and considered in my medical decision making (see chart for details).     Patient just discharged from the hospital after being admitted for left inguinal/scrotal abscess and cellulitis.  He is taking antibiotics but feels like pain and swelling has gotten worse today.  He did admit that he walked 4 miles also tried to repack the abscess himself after the packing came out and pain worsened afterwards.  Patient with significant swelling and erythema to the left inguinal area and left scrotum.  Will repeat CT scan to evaluate for worsening abscess or deep tissue infection.   11:58 PM CT scan improved, still showing cellulitis.  Patient is still on antibiotics.  I did remove his packing, he states that the packing is what it has been bothering him.  We discussed warm compresses and soaks at home.  Discussed continuing pain medications and antibiotics.  Follow-up closely with family doctor.  Return precautions discussed.  Patient is afebrile, nontoxic-appearing otherwise, normal vital signs, he is stable for discharge home.  Vitals:   09/21/17 2004  BP: 125/86  Pulse: 91  Resp: 16  Temp: 98.9 F (37.2 C)  TempSrc: Oral  SpO2: 99%    Weight: 114.3 kg (252  lb)  Height: 6' 1"  (1.854 m)     Final Clinical Impressions(s) / ED Diagnoses   Final diagnoses:  Abscess of left groin    ED Discharge Orders    None       Jeannett Senior, PA-C 09/22/17 0001    Quintella Reichert, MD 09/22/17 [phone]

## 2017-09-22 NOTE — Discharge Instructions (Addendum)
Continue antibiotics. Warm soapy soaks or compresses. Keep an eye on the area. If more red, swelling, drainage, fever, return to ER. Follow up with family doctor otherwise to make sure symptoms resolve.

## 2017-09-23 LAB — CULTURE, BLOOD (ROUTINE X 2)
CULTURE: NO GROWTH
Culture: NO GROWTH
Special Requests: ADEQUATE
Special Requests: ADEQUATE

## 2017-09-24 ENCOUNTER — Encounter: Payer: Self-pay | Admitting: Cardiology

## 2017-09-25 NOTE — Discharge Summary (Signed)
Physician Discharge Summary  [name] XBL:[id number] DOB: 1966/05/04 DOA: 09/17/2017  PCP: [name] Pier, MD  Admit date: 09/17/2017 Discharge date: 09/20/2017  Admitted From: Home Disposition:  Home.   Recommendations for Outpatient Follow-up:  1. Follow up with PCP in 1-2 weeks 2. Please obtain BMP/CBC in one week 3. Follow up with surgery as needed.   Discharge Condition:stable.  CODE STATUS: full cde.  Diet recommendation: Heart Healthy   Brief/Interim Summary: [name] Flaum Suttonis a 52 y.o.malewith medical history significant ofheart failure with reduced ejection fraction with ICD placement, type 2 diabetes, hypertension, reflux and several other medical problems presenting with a boil in his left groin.  He was found to have left inguinal abscess, he underwent I&D, IN ED.    Discharge Diagnoses:  Active Problems:   Cellulitis   Abscess   Abscess of groin  Left inguinal abscess s/p I&D Pain and swelling improved. He is able to do dressings by himself. Surgery consulted, recommended packing every day and antibiotics to complete the course.   Ischemic cardiomyopathy:  Resume home meds. No change in meds.    Sore throat: Swab neg for strep, cultures negative.     Stage 3 CKD: Stable.    Anemia:  Monitor.   Tpe 2 DM: Stable. Resume home meds.    BPH: Resume flomax.    Discharge Instructions  Discharge Instructions    Diet - low sodium heart healthy   Complete by:  As directed    Discharge instructions   Complete by:  As directed    Follow up with PCP in one week.     Allergies as of 09/20/2017      Reactions   Bee Venom Anaphylaxis      Medication List    TAKE these medications   ACCU-CHEK AVIVA PLUS test strip Generic drug:  glucose blood USE ONE STRIP THREE TIMES DAILY   ACCU-CHEK AVIVA PLUS w/Device Kit 1 Device by Does not apply route 3 (three) times daily after meals.   accu-chek softclix lancets 1 each by Other  route 3 (three) times daily.   amoxicillin-clavulanate 875-125 MG tablet Commonly known as:  AUGMENTIN Take 1 tablet by mouth every 12 (twelve) hours.   aspirin 81 MG tablet Take 81 mg by mouth daily.   atorvastatin 10 MG tablet Commonly known as:  LIPITOR Take 1 tablet (10 mg total) daily by mouth.   carvedilol 25 MG tablet Commonly known as:  COREG TAKE ONE TABLET BY MOUTH TWICE DAILY   cyclobenzaprine 5 MG tablet Commonly known as:  FLEXERIL TAKE 1 TABLET BY MOUTH THREE TIMES DAILY AS NEEDED FOR MUSCLE SPASM   doxycycline 100 MG tablet Commonly known as:  VIBRA-TABS Take 1 tablet (100 mg total) by mouth every 12 (twelve) hours.   furosemide 80 MG tablet Commonly known as:  LASIX TAKE 1 TABLET BY MOUTH TWICE DAILY   gabapentin 300 MG capsule Commonly known as:  NEURONTIN TAKE ONE CAPSULE BY MOUTH THREE TIMES DAILY What changed:  Another medication with the same name was removed. Continue taking this medication, and follow the directions you see here.   JANUVIA 100 MG tablet Generic drug:  sitaGLIPtin TAKE ONE TABLET BY MOUTH ONCE DAILY   KLOR-CON M20 20 MEQ tablet Generic drug:  potassium chloride SA TAKE 1 TABLET BY MOUTH ONCE DAILY   levalbuterol 45 MCG/ACT inhaler Commonly known as:  XOPENEX HFA Inhale 1-2 puffs into the lungs every 6 (six) hours as needed for wheezing.  Lumbar Back Brace/Support Pad Misc 1 each by Does not apply route daily. ICD 10 M54.5   metFORMIN 1000 MG tablet Commonly known as:  GLUCOPHAGE TAKE 1 TABLET BY MOUTH ONCE DAILY IN THE MORNING AND 1 & 1/2 (ONE & ONE-HALF) ONCE DAILY IN THE EVENING   oxyCODONE 5 MG immediate release tablet Commonly known as:  Oxy IR/ROXICODONE Take 1 tablet (5 mg total) by mouth every 6 (six) hours as needed for moderate pain.   pantoprazole 40 MG tablet Commonly known as:  PROTONIX TAKE 1 TABLET BY MOUTH ONCE DAILY   sacubitril-valsartan 24-26 MG Commonly known as:  ENTRESTO Take 1 tablet by mouth  2 (two) times daily. KEEP OV.   tamsulosin 0.4 MG Caps capsule Commonly known as:  FLOMAX Take by mouth 1 capsule 1 hr after breakfast for two weeks, then increase to 0.8 mg daily      Follow-up Information    [name] Pier, MD Follow up in 1 week(s).   Specialty:  Internal Medicine Why:  follow up the abscess in the left groin.  Contact information: [place] [address] [phone]          Allergies  Allergen Reactions  . Bee Venom Anaphylaxis    Consultations:  None.    Procedures/Studies: Dg Chest 2 View  Result Date: 09/17/2017 CLINICAL DATA:  Fever and chest pain EXAM: CHEST - 2 VIEW COMPARISON:  04/24/2016 FINDINGS: Left-sided pacing device as before. No significant effusion. Cardiomegaly. No focal airspace disease. No pneumothorax. IMPRESSION: No active cardiopulmonary disease.  Stable cardiomegaly. Electronically Signed   By: Donavan Foil M.D.   On: 09/17/2017 23:25   Ct Pelvis W Contrast  Result Date: 09/21/2017 CLINICAL DATA:  Assess known left inguinal abscess. Persistent left inguinal pain. EXAM: CT PELVIS WITH CONTRAST TECHNIQUE: Multidetector CT imaging of the pelvis was performed using the standard protocol following the bolus administration of intravenous contrast. CONTRAST:  166m ISOVUE-300 IOPAMIDOL (ISOVUE-300) INJECTION 61% COMPARISON:  CT of the abdomen and pelvis performed 09/17/2017 FINDINGS: Urinary Tract: The bladder is mildly distended and grossly unremarkable. Bowel: Appendicoliths are noted within the appendix. The appendix is unremarkable in appearance. Visualized small and large bowel loops are grossly unremarkable. Vascular/Lymphatic: The visualized vasculature is grossly unremarkable in appearance. No retroperitoneal or pelvic sidewall lymphadenopathy is seen. Reproductive:  The prostate remains normal in size. Other: A moderate left inguinal hernia is noted, with herniation of a segment of sigmoid colon into the  hernia. There is no evidence for obstruction. The previously noted left scrotal subcutaneous abscess has been drained, with minimal associated air at the site of prior abscess. Mild skin thickening is noted at the left side of the scrotum. Musculoskeletal: No acute osseous abnormalities are identified. The visualized musculature is unremarkable in appearance. IMPRESSION: 1. Previously noted left scrotal subcutaneous abscess has been drained, with minimal associated air at the site of prior abscess. Mild skin thickening at the left side of the scrotum. 2. Moderate left inguinal hernia, with herniation of a segment of sigmoid colon into the hernia. No evidence for obstruction. Electronically Signed   By: JGarald BaldingM.D.   On: 09/21/2017 23:46   Ct Abdomen Pelvis W Contrast  Result Date: 09/18/2017 CLINICAL DATA:  Left inguinal boil x2 days with fever. Left bump on the buttock for 2 years. EXAM: CT ABDOMEN AND PELVIS WITH CONTRAST TECHNIQUE: Multidetector CT imaging of the abdomen and pelvis was performed using the standard protocol following bolus administration of  intravenous contrast. CONTRAST:  163m ISOVUE-300 IOPAMIDOL (ISOVUE-300) INJECTION 61% COMPARISON:  08/06/2008 CT abdomen and pelvis, lumbar spine CT 10/24/2016 FINDINGS: Lower chest: Mild cardiomegaly without pericardial effusion. Right ventricular ICD lead in place. There is subsegmental atelectasis at the lung bases. Hepatobiliary: No focal liver abnormality is seen. Status post cholecystectomy. No biliary dilatation. Pancreas: Unremarkable. No pancreatic ductal dilatation or surrounding inflammatory changes. Spleen: Normal in size without focal abnormality. Adrenals/Urinary Tract: Bilateral adrenal nodules measuring 13 mm in the right and 22 mm on the left with low Hounsfield units compatible with benign adenomas. Stable finding on the right since 2010 and slight interval enlargement on left since prior. Symmetric enhancement of both kidneys  without obstructive uropathy. 1 cm cyst in the interpolar left kidney not well visualized on the unenhanced study from 2010. Unremarkable urinary bladder and ureters. Stomach/Bowel: Small hiatal hernia. Decompressed stomach normal small bowel rotation. No bowel obstruction or inflammation. Normal appendix. Moderate stool burden within the colon with scattered colonic diverticulosis. No diverticulitis. Vascular/Lymphatic: No aortic aneurysm. Mild atherosclerosis of the common iliac arteries bilaterally along the proximal aspect. No adenopathy. Reproductive: Mild prostatomegaly with the prostate measuring 5 cm in diameter. Other: Left inguinal hernia containing a segment of sigmoid colon and properitoneal fat without bowel obstruction. CT findings to correspond with the reported bump on the patient's left buttock. Musculoskeletal: No acute or significant osseous findings. IMPRESSION: 1. Nonobstructing left inguinal hernia containing properitoneal fat and a short segment of sigmoid colon. 2. Mild cardiomegaly. 3. Bilateral hypodense adrenal nodules compatible with benign adenomas measuring 13 mm on the right and 22 mm on the left. 4. Interpolar 1 cm left renal cyst.  No obstructive uropathy. 5. Mild prostatomegaly. Electronically Signed   By: DAshley RoyaltyM.D.   On: 09/18/2017 00:41       Subjective: No new complaints.   Discharge Exam: Vitals:   09/19/17 2018 09/20/17 0430  BP: 124/65 129/77  Pulse: 69 63  Resp: 18 16  Temp: 98.7 F (37.1 C) 98.2 F (36.8 C)  SpO2: 95% 99%   Vitals:   09/19/17 1345 09/19/17 1923 09/19/17 2018 09/20/17 0430  BP: 128/70 129/77 124/65 129/77  Pulse: 68 63 69 63  Resp: 16  18 16   Temp: (!) 97.5 F (36.4 C)  98.7 F (37.1 C) 98.2 F (36.8 C)  TempSrc: Oral  Oral Oral  SpO2: 98% 99% 95% 99%  Weight:    114.5 kg (252 lb 6.8 oz)  Height:        General: Pt is alert, awake, not in acute distress Cardiovascular: RRR, S1/S2 +, no rubs, no gallops Respiratory:  CTA bilaterally, no wheezing, no rhonchi Abdominal: Soft, NT, ND, bowel sounds + Extremities: no edema, no cyanosis    The results of significant diagnostics from this hospitalization (including imaging, microbiology, ancillary and laboratory) are listed below for reference.     Microbiology: Recent Results (from the past 240 hour(s))  Rapid Strep Screen ([facility])     Status: None   Collection Time: 09/17/17  8:11 PM  Result Value Ref Range Status   Streptococcus, Group A Screen (Direct) NEGATIVE NEGATIVE Final    Comment: (NOTE) A Rapid Antigen test may result negative if the antigen level in the sample is below the detection level of this test. The FDA has not cleared this test as a stand-alone test therefore the rapid antigen negative result has reflexed to a Group A Strep culture. Performed at [facility] [address] [place], [place] [address]   Culture, group A strep     Status: None   Collection Time: 09/17/17  8:11 PM  Result Value Ref Range Status   Specimen Description   Final    THROAT Performed at [facility], [address] [place], [place] [address]    Special Requests   Final    NONE Performed at [facility], [address] [place], [place] [address]    Culture   Final    NO GROUP A STREP (S.PYOGENES) ISOLATED Performed at [facility], [place] [address] [place], [place] [address]    Report Status 09/20/2017 FINAL  Final  Blood culture (routine x 2)     Status: None   Collection Time: 09/17/17  9:23 PM  Result Value Ref Range Status   Specimen Description   Final    BLOOD RIGHT ANTECUBITAL Performed at [facility], [address] [place], [place] [address]    Special Requests   Final    BOTTLES DRAWN AEROBIC AND ANAEROBIC Blood Culture adequate volume Performed at [facility], [address] [place], [place] [address]    Culture   Final    NO GROWTH 5 DAYS Performed at  [facility], [place] [address] [place], [place] [address]    Report Status 09/23/2017 FINAL  Final  Blood culture (routine x 2)     Status: None   Collection Time: 09/18/17 12:40 AM  Result Value Ref Range Status   Specimen Description   Final    BLOOD LEFT HAND Performed at [facility], [address] [place], [place] [address]    Special Requests   Final    BOTTLES DRAWN AEROBIC AND ANAEROBIC Blood Culture adequate volume Performed at [facility], [address] [place], [place] [address]    Culture   Final    NO GROWTH 5 DAYS Performed at [facility], [place] [address], [place], [place] [address]    Report Status 09/23/2017 FINAL  Final     Labs: BNP (last 3 results) No results for input(s): BNP in the last 8760 hours. Basic Metabolic Panel: Recent Labs  Lab 09/19/17 0521 09/21/17 2158  NA 138 139  K 4.3 3.6  CL 104 104  CO2 23 24  GLUCOSE 216* 118*  BUN 16 20  CREATININE 1.36* 1.56*  CALCIUM 8.6* 9.7   Liver Function Tests: Recent Labs  Lab 09/19/17 0521  AST 23  ALT 22  ALKPHOS 60  BILITOT 0.5  PROT 7.3  ALBUMIN 3.5   No results for input(s): LIPASE, AMYLASE in the last 168 hours. No results for input(s): AMMONIA in the last 168 hours. CBC: Recent Labs  Lab 09/19/17 0521 09/21/17 2158  WBC 5.6 6.5  NEUTROABS  --  2.9  HGB 10.7* 12.2*  HCT 33.5* 34.0*  MCV 91.8 88.8  PLT 276 352   Cardiac Enzymes: No results for input(s): CKTOTAL, CKMB, CKMBINDEX, TROPONINI in the last 168 hours. BNP: Invalid input(s): POCBNP CBG: Recent Labs  Lab 09/19/17 0533  GLUCAP 200*   D-Dimer No results for input(s): DDIMER in the last 72 hours. Hgb A1c No results for input(s): HGBA1C in the last 72 hours. Lipid Profile No results for input(s): CHOL, HDL, LDLCALC, TRIG, CHOLHDL, LDLDIRECT in the last 72 hours. Thyroid function studies No results for input(s): TSH, T4TOTAL, T3FREE,  THYROIDAB in the last 72 hours.  Invalid  input(s): FREET3 Anemia work up No results for input(s): VITAMINB12, FOLATE, FERRITIN, TIBC, IRON, RETICCTPCT in the last 72 hours. Urinalysis    Component Value Date/Time   COLORURINE YELLOW 09/17/2017 2103   APPEARANCEUR CLEAR 09/17/2017 2103   LABSPEC 1.010 09/17/2017 2103   PHURINE 5.5 09/17/2017 2103   GLUCOSEU NEGATIVE 09/17/2017 2103   HGBUR NEGATIVE 09/17/2017 2103   BILIRUBINUR NEGATIVE 09/17/2017 2103   BILIRUBINUR negative 10/20/2014 1532   KETONESUR NEGATIVE 09/17/2017 2103   PROTEINUR NEGATIVE 09/17/2017 2103   UROBILINOGEN 0.2 10/20/2014 1532   UROBILINOGEN 1.0 06/20/2013 1032   NITRITE NEGATIVE 09/17/2017 2103   LEUKOCYTESUR NEGATIVE 09/17/2017 2103   Sepsis Labs Invalid input(s): PROCALCITONIN,  WBC,  LACTICIDVEN Microbiology Recent Results (from the past 240 hour(s))  Rapid Strep Screen ([facility] ONLY)     Status: None   Collection Time: 09/17/17  8:11 PM  Result Value Ref Range Status   Streptococcus, Group A Screen (Direct) NEGATIVE NEGATIVE Final    Comment: (NOTE) A Rapid Antigen test may result negative if the antigen level in the sample is below the detection level of this test. The FDA has not cleared this test as a stand-alone test therefore the rapid antigen negative result has reflexed to a Group A Strep culture. Performed at [facility], [address] [place], [place] [address]   Culture, group A strep     Status: None   Collection Time: 09/17/17  8:11 PM  Result Value Ref Range Status   Specimen Description   Final    THROAT Performed at [facility], [address] [place], [place] [address]    Special Requests   Final    NONE Performed at [facility], [address] [place], [place] [address]    Culture   Final    NO GROUP A STREP (S.PYOGENES) ISOLATED Performed at [facility], [place] [address], [place], [place] [address]    Report Status 09/20/2017 FINAL  Final  Blood culture (routine x  2)     Status: None   Collection Time: 09/17/17  9:23 PM  Result Value Ref Range Status   Specimen Description   Final    BLOOD RIGHT ANTECUBITAL Performed at [facility], [address] [place], [place] [address]    Special Requests   Final    BOTTLES DRAWN AEROBIC AND ANAEROBIC Blood Culture adequate volume Performed at [facility], [address] [place], [place] [address]    Culture   Final    NO GROWTH 5 DAYS Performed at [facility], [place] [address], [place], [place] [address]    Report Status 09/23/2017 FINAL  Final  Blood culture (routine x 2)     Status: None   Collection Time: 09/18/17 12:40 AM  Result Value Ref Range Status   Specimen Description   Final    BLOOD LEFT HAND Performed at [facility], [address] [place], [place] [address]    Special Requests   Final    BOTTLES DRAWN AEROBIC AND ANAEROBIC Blood Culture adequate volume Performed at [facility], [address] [place], [place] [address]    Culture   Final    NO GROWTH 5 DAYS Performed at [facility], [place] [address] [place],  [address]    Report Status  09/23/2017 FINAL  Final     Time coordinating discharge: 34 minutes  SIGNED:   Hosie Poisson, MD  [facility] 09/25/2017, 11:51 PM Pager   If 7PM-7AM, please contact night-coverage [email or web address] Password [email or web address]

## 2017-09-25 NOTE — Progress Notes (Signed)
No ICM remote transmission received for 09/21/2017 and next ICM transmission scheduled for 10/12/2017.

## 2017-10-05 ENCOUNTER — Other Ambulatory Visit: Payer: Self-pay | Admitting: Internal Medicine

## 2017-10-05 ENCOUNTER — Other Ambulatory Visit: Payer: Self-pay | Admitting: Cardiology

## 2017-10-05 DIAGNOSIS — I5043 Acute on chronic combined systolic (congestive) and diastolic (congestive) heart failure: Secondary | ICD-10-CM

## 2017-10-05 DIAGNOSIS — K219 Gastro-esophageal reflux disease without esophagitis: Secondary | ICD-10-CM

## 2017-10-07 ENCOUNTER — Other Ambulatory Visit (INDEPENDENT_AMBULATORY_CARE_PROVIDER_SITE_OTHER): Payer: Self-pay

## 2017-10-07 DIAGNOSIS — Z794 Long term (current) use of insulin: Principal | ICD-10-CM

## 2017-10-07 DIAGNOSIS — E118 Type 2 diabetes mellitus with unspecified complications: Secondary | ICD-10-CM

## 2017-10-07 MED ORDER — SITAGLIPTIN PHOSPHATE 100 MG PO TABS: 100.0000 mg | ORAL_TABLET | Freq: Every day | ORAL | 3 refills | Status: DC

## 2017-10-11 ENCOUNTER — Encounter

## 2017-10-12 ENCOUNTER — Other Ambulatory Visit: Payer: Self-pay

## 2017-10-12 ENCOUNTER — Ambulatory Visit (INDEPENDENT_AMBULATORY_CARE_PROVIDER_SITE_OTHER): Payer: Medicaid Other

## 2017-10-12 ENCOUNTER — Telehealth: Payer: Self-pay

## 2017-10-12 DIAGNOSIS — Z794 Long term (current) use of insulin: Principal | ICD-10-CM

## 2017-10-12 DIAGNOSIS — I5022 Chronic systolic (congestive) heart failure: Secondary | ICD-10-CM

## 2017-10-12 DIAGNOSIS — Z95810 Presence of automatic (implantable) cardiac defibrillator: Secondary | ICD-10-CM

## 2017-10-12 DIAGNOSIS — E118 Type 2 diabetes mellitus with unspecified complications: Secondary | ICD-10-CM

## 2017-10-12 NOTE — Telephone Encounter (Signed)
Spoke with pt and reminded pt of remote transmission that is due today. Pt verbalized understanding.   

## 2017-10-13 ENCOUNTER — Other Ambulatory Visit: Payer: Self-pay | Admitting: Internal Medicine

## 2017-10-13 ENCOUNTER — Ambulatory Visit (INDEPENDENT_AMBULATORY_CARE_PROVIDER_SITE_OTHER): Payer: Medicaid Other | Admitting: *Deleted

## 2017-10-13 ENCOUNTER — Other Ambulatory Visit: Payer: Self-pay | Admitting: Podiatry

## 2017-10-13 DIAGNOSIS — I428 Other cardiomyopathies: Secondary | ICD-10-CM

## 2017-10-13 DIAGNOSIS — E118 Type 2 diabetes mellitus with unspecified complications: Secondary | ICD-10-CM

## 2017-10-13 DIAGNOSIS — Z794 Long term (current) use of insulin: Principal | ICD-10-CM

## 2017-10-13 NOTE — Progress Notes (Signed)
EPIC Encounter for ICM Monitoring  Patient Name: [name] is a 52 y.o. male Date: 10/13/2017 Primary Care Physican: [name], MD Primary Cardiologist:Crenshaw Electrophysiologist: Allred Dry Weight:242lbs          Heart Failure questions reviewed, pt asymptomatic.   Thoracic impedance normal but was abnormal suggesting fluid accumulation from 10/03/2017 - 10/08/2017.  Impedance also decreased from 09/16/2017 to 09/21/2017  Prescribed dosage: Furosemide 80 mg 1 tablet twice a day. Klor Con20 mEq 1 tablet daily.  Labs: 09/21/2017 Creatinine 1.56, BUN 20, Potassium 3.6, Sodium 139, EGFR 49-57 09/19/2017 Creatinine 1.36, BUN 16, Potassium 4.3, Sodium 138, EGFR 58->60  09/18/2017 Creatinine 1.46, EGFR 54->60  09/17/2017 Creatinine 1.55, BUN 20, Potassium 3.6, Sodium 137, EGFR 50-58   Recommendations: No changes.   Encouraged to call for fluid symptoms.  Follow-up plan: ICM clinic phone appointment on 11/12/2017.  Office appointment scheduled 01/05/2018 with [name].  Copy of ICM check sent to Dr. Rayann Heman.   3 month ICM trend: 10/13/2017    1 Year ICM trend:       [name], RN 10/13/2017 10:40 AM

## 2017-10-14 NOTE — Progress Notes (Signed)
Remote ICD transmission.   

## 2017-10-15 ENCOUNTER — Telehealth: Payer: Self-pay | Admitting: *Deleted

## 2017-10-15 ENCOUNTER — Encounter: Payer: Self-pay | Admitting: Cardiology

## 2017-10-15 NOTE — Telephone Encounter (Signed)
Dr. Paulla Dolly had refilled.

## 2017-10-20 ENCOUNTER — Ambulatory Visit: Payer: Medicaid Other | Attending: Internal Medicine | Admitting: Internal Medicine

## 2017-10-20 ENCOUNTER — Encounter: Payer: Self-pay | Admitting: Internal Medicine

## 2017-10-20 VITALS — BP 107/72 | HR 80 | Temp 98.0°F | Resp 16 | Wt 250.4 lb

## 2017-10-20 DIAGNOSIS — E1142 Type 2 diabetes mellitus with diabetic polyneuropathy: Secondary | ICD-10-CM | POA: Insufficient documentation

## 2017-10-20 DIAGNOSIS — G8929 Other chronic pain: Secondary | ICD-10-CM | POA: Insufficient documentation

## 2017-10-20 DIAGNOSIS — I5022 Chronic systolic (congestive) heart failure: Secondary | ICD-10-CM | POA: Insufficient documentation

## 2017-10-20 DIAGNOSIS — Z794 Long term (current) use of insulin: Secondary | ICD-10-CM | POA: Insufficient documentation

## 2017-10-20 DIAGNOSIS — K4090 Unilateral inguinal hernia, without obstruction or gangrene, not specified as recurrent: Secondary | ICD-10-CM | POA: Insufficient documentation

## 2017-10-20 DIAGNOSIS — Z95810 Presence of automatic (implantable) cardiac defibrillator: Secondary | ICD-10-CM | POA: Diagnosis not present

## 2017-10-20 DIAGNOSIS — K589 Irritable bowel syndrome without diarrhea: Secondary | ICD-10-CM | POA: Insufficient documentation

## 2017-10-20 DIAGNOSIS — Z7982 Long term (current) use of aspirin: Secondary | ICD-10-CM | POA: Diagnosis not present

## 2017-10-20 DIAGNOSIS — E669 Obesity, unspecified: Secondary | ICD-10-CM | POA: Insufficient documentation

## 2017-10-20 DIAGNOSIS — Z79899 Other long term (current) drug therapy: Secondary | ICD-10-CM | POA: Diagnosis not present

## 2017-10-20 DIAGNOSIS — K219 Gastro-esophageal reflux disease without esophagitis: Secondary | ICD-10-CM | POA: Insufficient documentation

## 2017-10-20 DIAGNOSIS — F419 Anxiety disorder, unspecified: Secondary | ICD-10-CM | POA: Insufficient documentation

## 2017-10-20 DIAGNOSIS — E785 Hyperlipidemia, unspecified: Secondary | ICD-10-CM | POA: Diagnosis not present

## 2017-10-20 DIAGNOSIS — Z87891 Personal history of nicotine dependence: Secondary | ICD-10-CM | POA: Diagnosis not present

## 2017-10-20 DIAGNOSIS — M25561 Pain in right knee: Secondary | ICD-10-CM | POA: Diagnosis present

## 2017-10-20 MED ORDER — GABAPENTIN 300 MG PO CAPS: 300.0000 mg | ORAL_CAPSULE | Freq: Three times a day (TID) | ORAL | 6 refills | Status: DC

## 2017-10-20 MED ORDER — GABAPENTIN 300 MG PO CAPS
300.0000 mg | ORAL_CAPSULE | Freq: Three times a day (TID) | ORAL | 6 refills | Status: DC
Start: 2017-10-20 — End: 2017-10-20
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
## 2017-10-20 NOTE — Patient Instructions (Signed)
Check your blood sugars several times a week.

## 2017-10-23 ENCOUNTER — Telehealth: Payer: Self-pay | Admitting: Cardiology

## 2017-10-23 NOTE — Telephone Encounter (Signed)
Spoke w/ pt and requested that he send a manual transmission b/c his home monitor has not updated in at least 7 days.   

## 2017-10-30 LAB — CUP PACEART REMOTE DEVICE CHECK
Battery Remaining Longevity: 72 mo
Battery Voltage: 2.96 V
Brady Statistic RV Percent Paced: 1 %
Date Time Interrogation Session: 20190507055031
HighPow Impedance: 68 Ohm
HighPow Impedance: 68 Ohm
Implantable Lead Implant Date: 20150717
Implantable Lead Location: 753860
Implantable Pulse Generator Implant Date: 20150717
Lead Channel Pacing Threshold Amplitude: 0.75 V
Lead Channel Sensing Intrinsic Amplitude: 11.8 mV
Lead Channel Setting Pacing Pulse Width: 0.5 ms
MDC IDC MSMT BATTERY REMAINING PERCENTAGE: 68 %
MDC IDC MSMT LEADCHNL RV IMPEDANCE VALUE: 360 Ohm
MDC IDC MSMT LEADCHNL RV PACING THRESHOLD PULSEWIDTH: 0.5 ms
MDC IDC PG SERIAL: 7196009
MDC IDC SET LEADCHNL RV PACING AMPLITUDE: 2.5 V
MDC IDC SET LEADCHNL RV SENSING SENSITIVITY: 0.5 mV

## 2017-11-05 ENCOUNTER — Telehealth: Payer: Self-pay | Admitting: Cardiology

## 2017-11-05 NOTE — Telephone Encounter (Signed)
   [facility] Pre-operative Risk Assessment    Request for surgical clearance:  1. What type of surgery is being performed? laparoscopic inguinal hernia repair with mesh  2. When is this surgery scheduled? TBD   3. What type of clearance is required (medical clearance vs. Pharmacy clearance to hold med vs. Both)? Both   4. Are there any medications that need to be held prior to surgery and how long? ASA 81 - 5 days prior but Dr. Redmond Pulling will do surgery even if cardiology thinks patient should stay on ASA   5. Practice name and name of physician performing surgery? Dr. Redmond Pulling @ [facility]    6. What is your office phone number [phone]    7.   What is your office fax number [phone] (Attn: Carlene Coria, [place])   8.   Anesthesia type (None, local, MAC, general) ? General    Sheral Apley M 11/05/2017, 5:02 PM  _________________________________________________________________   (provider comments below)

## 2017-11-06 ENCOUNTER — Telehealth: Payer: Self-pay | Admitting: Cardiology

## 2017-11-06 NOTE — Telephone Encounter (Signed)
   Primary Cardiologist:Brian Stanford Breed, MD  Chart reviewed as part of pre-operative protocol coverage. Because of [name]'s past medical history and time since last visit, he/she will require a follow-up visit in order to better assess preoperative cardiovascular risk.  Will route the note to **[name] to address if it is okay to hold aspirin 5 days prior**  Pre-op covering staff: - Please schedule appointment and call patient to inform them. - Please contact requesting surgeon's office via preferred method (i.e, phone, fax) to inform them of need for appointment prior to surgery.  Rosaria Ferries, PA-C  11/06/2017, 3:37 PM

## 2017-11-06 NOTE — Telephone Encounter (Signed)
Left message to call back  

## 2017-11-06 NOTE — Telephone Encounter (Signed)
Received records from [facility] on 11/06/17, Appt 01/05/18 @ 4:00PM. NV

## 2017-11-07 NOTE — Telephone Encounter (Signed)
[name]  

## 2017-11-09 NOTE — Telephone Encounter (Signed)
Contacted patient and appointment made scheduled. Patient voiced understanding.

## 2017-11-12 ENCOUNTER — Telehealth: Payer: Self-pay | Admitting: Cardiology

## 2017-11-12 ENCOUNTER — Ambulatory Visit (INDEPENDENT_AMBULATORY_CARE_PROVIDER_SITE_OTHER): Payer: Medicaid Other

## 2017-11-12 DIAGNOSIS — I5022 Chronic systolic (congestive) heart failure: Secondary | ICD-10-CM

## 2017-11-12 DIAGNOSIS — Z95810 Presence of automatic (implantable) cardiac defibrillator: Secondary | ICD-10-CM

## 2017-11-12 NOTE — Telephone Encounter (Signed)
Spoke with pt and reminded pt of remote transmission that is due today. Pt verbalized understanding.   

## 2017-11-13 NOTE — Progress Notes (Signed)
EPIC Encounter for ICM Monitoring  Patient Name: [name] is a 52 y.o. male Date: 11/13/2017 Primary Care Physican: [name], MD Primary Cardiologist:[place] Electrophysiologist: Allred Dry Weight:Previous weight 242lbs       Attempted call to patient and unable to reach.  Left detailed message, per DPR, regarding transmission.  Transmission reviewed.    Thoracic impedance normal.  Prescribed dosage: Furosemide 80 mg 1 tablet twice a day. [place]lor Con20 mEq 1 tablet daily.  Labs: 09/21/2017 Creatinine 1.56, BUN 20, Potassium 3.6, Sodium 139, EGFR 49-57 09/19/2017 Creatinine 1.36, BUN 16, Potassium 4.3, Sodium 138, EGFR 58->60  09/18/2017 Creatinine 1.46, EGFR 54->60  09/17/2017 Creatinine 1.55, BUN 20, Potassium 3.6, Sodium 137, EGFR 50-58   Recommendations: Left voice mail with ICM number and encouraged to call if experiencing any fluid symptoms.  Follow-up plan: ICM clinic phone appointment on 12/17/2017.  Last office visit with [name] was 04/23/2015 and message sent to scheduler to call patient regarding appointment.  Office appointment scheduled 11/16/2017 with Rosaria Ferries, PA. Office appointment scheduled 01/05/2018 with [name].  Copy of ICM check sent to Dr. Rayann Heman.   3 month ICM trend: 11/13/2017    1 Year ICM trend:       [name], RN 11/13/2017 9:44 AM

## 2017-11-16 ENCOUNTER — Ambulatory Visit: Payer: Medicaid Other | Admitting: Physician Assistant

## 2017-11-16 ENCOUNTER — Encounter: Payer: Self-pay | Admitting: Physician Assistant

## 2017-11-16 VITALS — BP 112/70 | HR 86 | Ht 73.0 in | Wt 249.0 lb

## 2017-11-16 DIAGNOSIS — I10 Essential (primary) hypertension: Secondary | ICD-10-CM

## 2017-11-16 DIAGNOSIS — K219 Gastro-esophageal reflux disease without esophagitis: Secondary | ICD-10-CM

## 2017-11-16 DIAGNOSIS — I5042 Chronic combined systolic (congestive) and diastolic (congestive) heart failure: Secondary | ICD-10-CM

## 2017-11-16 DIAGNOSIS — I519 Heart disease, unspecified: Secondary | ICD-10-CM | POA: Diagnosis not present

## 2017-11-16 DIAGNOSIS — I428 Other cardiomyopathies: Secondary | ICD-10-CM

## 2017-11-16 MED ORDER — POTASSIUM CHLORIDE CRYS ER 20 MEQ PO TBCR: 20.0000 meq | EXTENDED_RELEASE_TABLET | Freq: Every day | ORAL | 3 refills | Status: DC

## 2017-11-16 MED ORDER — FUROSEMIDE 80 MG PO TABS: 80.0000 mg | ORAL_TABLET | Freq: Two times a day (BID) | ORAL | 3 refills | Status: DC

## 2017-11-16 MED ORDER — SACUBITRIL-VALSARTAN 24-26 MG PO TABS: 1.0000 | ORAL_TABLET | Freq: Two times a day (BID) | ORAL | 3 refills | Status: DC

## 2017-11-16 MED ORDER — CARVEDILOL 25 MG PO TABS: 25.0000 mg | ORAL_TABLET | Freq: Two times a day (BID) | ORAL | 3 refills | Status: DC

## 2017-11-16 NOTE — Patient Instructions (Addendum)
Medication Instructions:  Your physician recommends that you continue on your current medications as directed. Please refer to the Current Medication list given to you today.  Labwork: Your physician recommends that you return for lab work in: TODAY-BMET  Testing/Procedures: Your physician has requested that you have an echocardiogram. Echocardiography is a painless test that uses sound waves to create images of your heart. It provides your doctor with information about the size and shape of your heart and how well your heart's chambers and valves are working. This procedure takes approximately one hour. There are no restrictions for this procedure. [place] wants you to follow-up in: 12 months with Dr Stanford Breed. You will receive a reminder letter in the mail two months in advance. If you don't receive a letter, please call our office to schedule the follow-up appointment.  Any Other Special Instructions Will Be Listed Below (If Applicable). 1. Check your weight daily 2. DRINK NO more than 2 liters of fluid a day 3. Only Eat 500mg  of Sodium per meal or no more than 2000mg  of sodium per day  4. You have been cleared for surgery  If you need a refill on your cardiac medications before your next appointment, please call your pharmacy.

## 2017-11-16 NOTE — Progress Notes (Signed)
Cardiology Office Note   Date:  11/17/2017   ID:  [name], DOB [birth year], MRN [id number]  PCP:  [name], MD  Cardiologist: [name], 10/14/2016 Electrophysiology: Dr. Rayann Heman, 04/2015 Rosaria Ferries, PA-C 04/14/2016  Chief Complaint  Patient presents with  . Pre-op Exam  . Chest Pain  . Shortness of Breath    History of Present Illness: [name] is a 52 y.o. male with a history of NICM w/ nl cors 2015 and EF 20%>>40%echo 2017, CPX 01/2016 w/ moderate functional impairment, primarily circulatory, GERD, HTN, S-D-CHF, obesity, syncope, STJ single chamber ICD  11/05/2017 encounter regarding cardiac clearance for laparoscopic inguinal hernia repair with mesh, records received an appointment made  [name] presents for cardiology follow up and evaluation.   He has problems occasionally with being light-headed or dizzy when he bends over. It will last a minute or 2 when he stands up. He will feel SOB at that time. The SOB will last only a couple of minutes. He cannot correlate that with weight gain or loss.   His dry weight is just over 240 lbs. He weighed 241 lbs 4 days ago. He is willing to weigh daily.   He takes extra Lasix when his weight goes up and he feels more SOB. If his weight is 250 lbs, he will take it. Only has to do this once every couple of months.   He eats out a lot, but feels he can get a lower sodium meal at the [employer].   He gets chest pain a couple of times/day. It does not last long. It resolves spontaneously in < 1 minute. Does not last long enough to take rx. Not exertional.  He walks 3 miles/day. Can generally walk a flight of stairs without stopping most of the time. Some days he is more winded than others, unknown if this correlates with increased weight.  No palpitations, heart does not skip or race.   He took an extra gabapentin last pm for foot pain. Wonders if that is why he feels so tired and drained today.  Generally  feels well.   Past Medical History:  Diagnosis Date  . Chronic bronchitis ([place])    "get it most q yr" (12/23/2013)  . Chronic systolic (congestive) heart failure ([place])   . Fracture of left humerus    a. 07/2013.  Marland Kitchen GERD (gastroesophageal reflux disease)    not at present time  . Heart murmur    "born w/it"   . History of renal calculi   . Hypertension   . Kidney stones   . NICM (nonischemic cardiomyopathy) ([place])    a. 07/2013 Echo: EF 20-25%, diff HK, Gr2 DD, mild MR, mod dil LA/RA. EF 40% 2017 echo  . Obesity   . 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 Syncope   . Type II diabetes mellitus ([place]) 2006    Past Surgical History:  Procedure Laterality Date  . CARDIAC CATHETERIZATION  08/10/13  . CHOLECYSTECTOMY  ~ 2010  . IMPLANTABLE CARDIOVERTER DEFIBRILLATOR IMPLANT  12/23/2013   STJ Fortify ICD implanted by [name] for cardiomyopathy and syncope  . IMPLANTABLE CARDIOVERTER DEFIBRILLATOR IMPLANT N/A 12/23/2013   Procedure: IMPLANTABLE CARDIOVERTER DEFIBRILLATOR IMPLANT;  Surgeon: Coralyn Mark, MD;  Location: [facility] CATH LAB;  Service: Cardiovascular;  Laterality: N/A;  . LEFT HEART CATHETERIZATION WITH CORONARY ANGIOGRAM N/A  08/10/2013   Procedure: LEFT HEART CATHETERIZATION WITH CORONARY ANGIOGRAM;  Surgeon: Jettie Booze, MD;  Location: [facility] CATH LAB;  Service: Cardiovascular;  Laterality: N/A;  . ORIF HUMERUS FRACTURE Left 08/12/2013   Procedure: OPEN REDUCTION INTERNAL FIXATION (ORIF) HUMERAL SHAFT FRACTURE;  Surgeon: Newt Minion, MD;  Location: [facility];  Service: Orthopedics;  Laterality: Left;  Open Reduction Internal Fixation Left Humerus  . URETEROSCOPY     "laser for kidney stones"    Current Outpatient Medications  Medication Sig Dispense Refill  . ACCU-CHEK AVIVA PLUS test strip USE ONE STRIP THREE TIMES DAILY 100 each 12  . aspirin 81 MG tablet  Take 81 mg by mouth daily.    Marland Kitchen atorvastatin (LIPITOR) 10 MG tablet Take 1 tablet (10 mg total) daily by mouth. 30 tablet 11  . Blood Glucose Monitoring Suppl (ACCU-CHEK AVIVA PLUS) W/DEVICE KIT 1 Device by Does not apply route 3 (three) times daily after meals. 1 kit 0  . carvedilol (COREG) 25 MG tablet TAKE ONE TABLET BY MOUTH TWICE DAILY 180 tablet 3  . cyclobenzaprine (FLEXERIL) 5 MG tablet TAKE 1 TABLET BY MOUTH THREE TIMES DAILY AS NEEDED FOR MUSCLE SPASM 60 tablet 0  . furosemide (LASIX) 80 MG tablet TAKE 1 TABLET BY MOUTH TWICE DAILY 180 tablet 0  . gabapentin (NEURONTIN) 300 MG capsule Take 1 capsule (300 mg total) by mouth 3 (three) times daily. 90 capsule 6  . KLOR-CON M20 20 MEQ tablet TAKE 1 TABLET BY MOUTH ONCE DAILY 90 tablet 0  . Lancet Devices (ACCU-CHEK SOFTCLIX) lancets 1 each by Other route 3 (three) times daily. 1 each 0  . metFORMIN (GLUCOPHAGE) 1000 MG tablet TAKE 1 TABLET BY MOUTH ONCE DAILY IN THE MORNING AND 1 & 1/2 (ONE & ONE-HALF) ONCE DAILY IN THE EVENING 225 tablet 0  . oxyCODONE (OXY IR/ROXICODONE) 5 MG immediate release tablet Take 1 tablet (5 mg total) by mouth every 6 (six) hours as needed for moderate pain. 8 tablet 0  . pantoprazole (PROTONIX) 40 MG tablet TAKE 1 TABLET BY MOUTH ONCE DAILY 90 tablet 0  . sacubitril-valsartan (ENTRESTO) 24-26 MG Take 1 tablet by mouth 2 (two) times daily. KEEP OV. 180 tablet 0  . sitaGLIPtin (JANUVIA) 100 MG tablet Take 1 tablet (100 mg total) by mouth daily. 90 tablet 3  . tamsulosin (FLOMAX) 0.4 MG CAPS capsule Take by mouth 1 capsule 1 hr after breakfast for two weeks, then increase to 0.8 mg daily (Patient taking differently: Take 0.8 mg by mouth daily after breakfast. Take by mouth 1 capsule 1 hr after breakfast for two weeks, then increase to 0.8 mg daily) 60 capsule 2   No current facility-administered medications for this visit.     Allergies:   Bee venom    Social History:  The patient  reports that he quit smoking  about 3 years ago. His smoking use included cigarettes. He has a 28.00 pack-year smoking history. He has never used smokeless tobacco. He reports that he drinks alcohol. He reports that he has current or past drug history. Drug: Marijuana.   Family History:  The patient's family history includes Arthritis in his father and mother; Diabetes in his father and mother; Hypertension in his brother, father, and mother.    ROS:  Please see the history of present illness. All other systems are reviewed and negative.    PHYSICAL EXAM: VS:  BP 112/70   Pulse 86   Ht 6' 1"  (1.854 m)  Wt 249 lb (112.9 kg)   BMI 32.85 kg/m  , BMI Body mass index is 32.85 kg/m. GEN: Well nourished, well developed, male in no acute distress  HEENT: normal for age  Neck: no JVD, no carotid bruit, no masses Cardiac: RRR; no murmur, no rubs, or gallops Respiratory:  clear to auscultation bilaterally, normal work of breathing GI: soft, nontender, nondistended, + BS MS: no deformity or atrophy; no edema; distal pulses are 2+ in all 4 extremities   Skin: warm and dry, no rash Neuro:  Strength and sensation are intact Psych: euthymic mood, full affect   EKG:  EKG is ordered today. The ekg ordered today demonstrates sinus rhythm, heart rate 86, left axis deviation, no change from 09/17/2017  ECHO: 01/24/2016 - Left ventricle: Diffuse hypokineisis worse in the posterior   lateral wall. The cavity size was moderately dilated. Wall   thickness was increased in a pattern of moderate LVH. Systolic   function was normal. The estimated ejection fraction was 40%.   Wall motion was normal; there were no regional wall motion   abnormalities. Doppler parameters are consistent with abnormal   left ventricular relaxation (grade 1 diastolic dysfunction). - Left atrium: The atrium was moderately dilated. - Atrial septum: No defect or patent foramen ovale was identified.  Recent Labs: 09/19/2017: ALT 22 09/21/2017: Hemoglobin  12.2; Platelets 352 11/16/2017: BUN 21; Creatinine, Ser 1.78; Potassium 4.2; Sodium 138    Lipid Panel    Component Value Date/Time   CHOL 204 (H) 04/17/2017 1525   TRIG 321 (H) 04/17/2017 1525   HDL 37 (L) 04/17/2017 1525   CHOLHDL 5.5 (H) 04/17/2017 1525   CHOLHDL 4 05/08/2009 1040   VLDL 27.0 05/08/2009 1040   LDLCALC 103 (H) 04/17/2017 1525     Wt Readings from Last 3 Encounters:  11/16/17 249 lb (112.9 kg)  10/20/17 250 lb 6.4 oz (113.6 kg)  09/21/17 252 lb (114.3 kg)     Other studies Reviewed: Additional studies/ records that were reviewed today include: Office notes, hospital records and testing.  ASSESSMENT AND PLAN:  1.  Chronic systolic and diastolic CHF: His weight is stable and his volume status is good by exam. Continue current Lasix dose, check a BMET today. Continue Coreg and Enresto  2. NICM: Continue current meds, recheck echo, hopefully, his EF is improved.  3. GERD: Continue Protonix   Current medicines are reviewed at length with the patient today.  The patient does not have concerns regarding medicines.  The following changes have been made:  no change  Labs/ tests ordered today include:   Orders Placed This Encounter  Procedures  . Basic Metabolic Panel (BMET)  . EKG 12-Lead  . ECHOCARDIOGRAM COMPLETE     Disposition:   FU with Dr Stanford Breed in 1 year  Signed, Rosaria Ferries, PA-C  11/17/2017 8:49 AM    [place] Phone: [phone]; Fax: [phone]  This note was written with the assistance of speech recognition software. Please excuse any transcriptional errors.

## 2017-11-17 LAB — BASIC METABOLIC PANEL
BUN/Creatinine Ratio: 12 (ref 9–20)
BUN: 21 mg/dL (ref 6–24)
CHLORIDE: 98 mmol/L (ref 96–106)
CO2: 22 mmol/L (ref 20–29)
Calcium: 9.4 mg/dL (ref 8.7–10.2)
Creatinine, Ser: 1.78 mg/dL — ABNORMAL HIGH (ref 0.76–1.27)
GFR calc Af Amer: 49 mL/min/{1.73_m2} — ABNORMAL LOW (ref >59)
GFR calc non Af Amer: 43 mL/min/{1.73_m2} — ABNORMAL LOW (ref >59)
Glucose: 252 mg/dL — ABNORMAL HIGH (ref 65–99)
Potassium: 4.2 mmol/L (ref 3.5–5.2)
SODIUM: 138 mmol/L (ref 134–144)

## 2017-11-18 ENCOUNTER — Encounter: Payer: Self-pay | Admitting: Internal Medicine

## 2017-11-20 ENCOUNTER — Telehealth: Payer: Self-pay | Admitting: *Deleted

## 2017-11-20 DIAGNOSIS — I5022 Chronic systolic (congestive) heart failure: Secondary | ICD-10-CM

## 2017-11-20 DIAGNOSIS — I428 Other cardiomyopathies: Secondary | ICD-10-CM

## 2017-11-20 DIAGNOSIS — R7989 Other specified abnormal findings of blood chemistry: Secondary | ICD-10-CM

## 2017-11-20 DIAGNOSIS — Z79899 Other long term (current) drug therapy: Secondary | ICD-10-CM

## 2017-11-20 NOTE — Telephone Encounter (Signed)
-----   Message from Lonn Georgia, PA-C sent at 11/19/2017  5:13 PM EDT ----- Please let [name] know that his sugar was very high on his recent labs, 252. His Cr was above baseline.  If he could take Lasix 80 mg qd and take the 2nd dose only 4 days a week, that would be great. Take Kdur only on the 4 days he takes BID Lasix. If this does not maintain his weight, go back to the old dosing. Either way, recheck in 3 weeks. Thanks

## 2017-11-20 NOTE — Telephone Encounter (Signed)
Left message to call back - in regards to lab work

## 2017-11-23 MED ORDER — POTASSIUM CHLORIDE CRYS ER 20 MEQ PO TBCR: EXTENDED_RELEASE_TABLET | ORAL | 3 refills | Status: DC

## 2017-11-23 NOTE — Telephone Encounter (Signed)
Left message to call back  

## 2017-11-23 NOTE — Telephone Encounter (Signed)
Pt returned call to [facility] regarding labs [phone]

## 2017-11-23 NOTE — Telephone Encounter (Signed)
Spoke to patient. Result given. Aware of the changes , will go to [facility] at [address]  [address] Requested refill for potassium - sent #90 day supply Patient will do  m-w-f-sat medication lasix 80 mg twice a day and the others day 80 mg once a day. Along with twice a day patient will take potassium.  Patient voiced understanding.

## 2017-11-24 ENCOUNTER — Ambulatory Visit (HOSPITAL_COMMUNITY): Payer: Medicaid Other

## 2017-11-24 ENCOUNTER — Other Ambulatory Visit: Payer: Self-pay | Admitting: Internal Medicine

## 2017-11-24 DIAGNOSIS — K219 Gastro-esophageal reflux disease without esophagitis: Secondary | ICD-10-CM

## 2017-12-09 ENCOUNTER — Encounter (HOSPITAL_COMMUNITY): Payer: Self-pay | Admitting: Radiology

## 2017-12-17 ENCOUNTER — Telehealth: Payer: Self-pay | Admitting: Cardiology

## 2017-12-17 NOTE — Telephone Encounter (Signed)
LMOVM reminding pt to send remote transmission.   

## 2017-12-21 NOTE — Progress Notes (Signed)
No ICM remote transmission received for 12/17/2017 and next ICM transmission scheduled for 01/12/2018.

## 2017-12-28 ENCOUNTER — Telehealth: Payer: Self-pay | Admitting: Cardiology

## 2017-12-28 NOTE — Telephone Encounter (Signed)
LMOVM requesting that pt send manual transmission b/c home monitor has not updated in at least 7 days.    

## 2018-01-01 NOTE — Progress Notes (Signed)
HPI: FU CM/CHF. Echo 2/15 revealed EF of 20-25% with diffuse hypokinesis and biatrial enlargement, mild MR and a small pericardial effusion. TSH in February 2015 normal. Cardiac catheterization in March of 2015 showed normal coronary arteries. Also with prior ICD. Echocardiogram August 2017 showed moderate left ventricular hypertrophy, moderate left ventricular enlargement, ejection fraction 35%, grade 1 diastolic dysfunction and moderate left atrial enlargement. CPX August 2017 showed submaximal effort with moderate functional impairment primarily circulatory limited. Since last seen,  he notes some dyspnea on exertion but no orthopnea, PND or pedal edema.  Occasional brief chest pain for seconds unchanged.  No syncope.   Current Outpatient Medications  Medication Sig Dispense Refill  . ACCU-CHEK AVIVA PLUS test strip USE ONE STRIP THREE TIMES DAILY 100 each 12  . aspirin 81 MG tablet Take 81 mg by mouth daily.    Marland Kitchen atorvastatin (LIPITOR) 10 MG tablet Take 1 tablet (10 mg total) daily by mouth. 30 tablet 11  . Blood Glucose Monitoring Suppl (ACCU-CHEK AVIVA PLUS) W/DEVICE KIT 1 Device by Does not apply route 3 (three) times daily after meals. 1 kit 0  . carvedilol (COREG) 25 MG tablet Take 1 tablet (25 mg total) by mouth 2 (two) times daily. 180 tablet 3  . cyclobenzaprine (FLEXERIL) 5 MG tablet TAKE 1 TABLET BY MOUTH THREE TIMES DAILY AS NEEDED FOR MUSCLE SPASM 60 tablet 0  . furosemide (LASIX) 80 MG tablet Take 1 tablet (80 mg total) by mouth 2 (two) times daily. 180 tablet 3  . gabapentin (NEURONTIN) 300 MG capsule Take 1 capsule (300 mg total) by mouth 3 (three) times daily. 90 capsule 6  . Lancet Devices (ACCU-CHEK SOFTCLIX) lancets 1 each by Other route 3 (three) times daily. 1 each 0  . metFORMIN (GLUCOPHAGE) 1000 MG tablet TAKE 1 TABLET BY MOUTH ONCE DAILY IN THE MORNING AND 1 & 1/2 (ONE & ONE-HALF) ONCE DAILY IN THE EVENING 225 tablet 0  . oxyCODONE (OXY IR/ROXICODONE) 5 MG  immediate release tablet Take 1 tablet (5 mg total) by mouth every 6 (six) hours as needed for moderate pain. 8 tablet 0  . pantoprazole (PROTONIX) 40 MG tablet TAKE 1 TABLET BY MOUTH ONCE DAILY 90 tablet 0  . potassium chloride SA (KLOR-CON M20) 20 MEQ tablet Take 1 tablet ( 20 meq) by mouth daily or as directed. 90 tablet 3  . sacubitril-valsartan (ENTRESTO) 24-26 MG Take 1 tablet by mouth 2 (two) times daily. 180 tablet 3  . sitaGLIPtin (JANUVIA) 100 MG tablet Take 1 tablet (100 mg total) by mouth daily. 90 tablet 3  . tamsulosin (FLOMAX) 0.4 MG CAPS capsule Take by mouth 1 capsule 1 hr after breakfast for two weeks, then increase to 0.8 mg daily (Patient taking differently: Take 0.8 mg by mouth daily after breakfast. Take by mouth 1 capsule 1 hr after breakfast for two weeks, then increase to 0.8 mg daily) 60 capsule 2   No current facility-administered medications for this visit.      Past Medical History:  Diagnosis Date  . Chronic bronchitis ([place])    "get it most q yr" (12/23/2013)  . Chronic systolic (congestive) heart failure ([place])   . Fracture of left humerus    a. 07/2013.  Marland Kitchen GERD (gastroesophageal reflux disease)    not at present time  . Heart murmur    "born w/it"   . History of renal calculi   . Hypertension   . Kidney stones   . NICM (  nonischemic cardiomyopathy) ([place])    a. 07/2013 Echo: EF 20-25%, diff HK, Gr2 DD, mild MR, mod dil LA/RA. EF 40% 2017 echo  . Obesity   . 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 Syncope   . Type II diabetes mellitus ([place]) 2006    Past Surgical History:  Procedure Laterality Date  . CARDIAC CATHETERIZATION  08/10/13  . CHOLECYSTECTOMY  ~ 2010  . IMPLANTABLE CARDIOVERTER DEFIBRILLATOR IMPLANT  12/23/2013   STJ Fortify ICD implanted by [name] for cardiomyopathy and syncope  . IMPLANTABLE CARDIOVERTER DEFIBRILLATOR  IMPLANT N/A 12/23/2013   Procedure: IMPLANTABLE CARDIOVERTER DEFIBRILLATOR IMPLANT;  Surgeon: Coralyn Mark, MD;  Location: [facility] CATH LAB;  Service: Cardiovascular;  Laterality: N/A;  . LEFT HEART CATHETERIZATION WITH CORONARY ANGIOGRAM N/A 08/10/2013   Procedure: LEFT HEART CATHETERIZATION WITH CORONARY ANGIOGRAM;  Surgeon: Jettie Booze, MD;  Location: [facility] CATH LAB;  Service: Cardiovascular;  Laterality: N/A;  . ORIF HUMERUS FRACTURE Left 08/12/2013   Procedure: OPEN REDUCTION INTERNAL FIXATION (ORIF) HUMERAL SHAFT FRACTURE;  Surgeon: Newt Minion, MD;  Location: [facility];  Service: Orthopedics;  Laterality: Left;  Open Reduction Internal Fixation Left Humerus  . URETEROSCOPY     "laser for kidney stones"    Social History   Socioeconomic History  . Marital status: Single    Spouse name: Not on file  . Number of children: Not on file  . Years of education: Not on file  . Highest education level: Not on file  Occupational History  . Occupation: Disabled  Social Needs  . Financial resource strain: Not on file  . Food insecurity:    Worry: Not on file    Inability: Not on file  . Transportation needs:    Medical: Not on file    Non-medical: Not on file  Tobacco Use  . Smoking status: Former Smoker    Packs/day: 1.00    Years: 28.00    Pack years: 28.00    Types: Cigarettes    Last attempt to quit: 06/03/2014    Years since quitting: 3.5  . Smokeless tobacco: Never Used  Substance and Sexual Activity  . Alcohol use: Yes    Alcohol/week: 0.0 oz    Comment: 12/23/2013 "might drink a couple times/yr"  . Drug use: Yes    Types: Marijuana  . Sexual activity: Yes  Lifestyle  . Physical activity:    Days per week: Not on file    Minutes per session: Not on file  . Stress: Not on file  Relationships  . Social connections:    Talks on phone: Not on file    Gets together: Not on file    Attends religious service: Not on file    Active member of club or organization: Not on file      Attends meetings of clubs or organizations: Not on file    Relationship status: Not on file  . Intimate partner violence:    Fear of current or ex partner: Not on file    Emotionally abused: Not on file    Physically abused: Not on file    Forced sexual activity: Not on file  Other Topics Concern  . Not on file  Social History Narrative   Pt lives with his brother     Family History  Problem Relation Age of Onset  . Diabetes Father   . Arthritis Father   .  Hypertension Father   . Diabetes Mother   . Arthritis Mother   . Hypertension Mother   . Hypertension Brother     ROS: no fevers or chills, productive cough, hemoptysis, dysphasia, odynophagia, melena, hematochezia, dysuria, hematuria, rash, seizure activity, orthopnea, PND, pedal edema, claudication. Remaining systems are negative.  Physical Exam: Well-developed well-nourished in no acute distress.  Skin is warm and dry.  HEENT is normal.  Neck is supple.  Chest is clear to auscultation with normal expansion.  Cardiovascular exam is regular rate and rhythm.  Abdominal exam nontender or distended. No masses palpated. Extremities show no edema. neuro grossly intact  A/P  1 chronic systolic congestive heart failure-patient appears to be euvolemic on examination.  His symptoms are somewhat difficult to assess.  We will continue with present dose of diuretic.  We discussed fluid restriction, low-sodium diet and daily weights.  2 nonischemic cardiomyopathy -continue carvedilol and Entresto.  3 chronic chest pain-previous catheterization as outlined showed no significant coronary disease.  Symptoms are unchanged and atypical.  No further ischemia evaluation.  4 prior ICD-followed by electrophysiology.  5 chronic stage III kidney disease-followed by primary care.  [name], MD

## 2018-01-05 ENCOUNTER — Encounter

## 2018-01-05 ENCOUNTER — Ambulatory Visit: Payer: Medicaid Other | Admitting: Cardiology

## 2018-01-05 ENCOUNTER — Encounter: Payer: Self-pay | Admitting: Cardiology

## 2018-01-05 VITALS — BP 100/72 | HR 69 | Ht 72.0 in | Wt 242.4 lb

## 2018-01-05 DIAGNOSIS — R072 Precordial pain: Secondary | ICD-10-CM

## 2018-01-05 DIAGNOSIS — I5022 Chronic systolic (congestive) heart failure: Secondary | ICD-10-CM

## 2018-01-05 DIAGNOSIS — I428 Other cardiomyopathies: Secondary | ICD-10-CM | POA: Diagnosis not present

## 2018-01-05 NOTE — Patient Instructions (Signed)
Your physician wants you to follow-up in: 6 months with dr Trellis Moment will receive a reminder letter in the mail two months in advance. If you don't receive a letter, please call our office to schedule the follow-up appointment.

## 2018-01-12 ENCOUNTER — Telehealth: Payer: Self-pay

## 2018-01-12 ENCOUNTER — Encounter: Payer: Medicaid Other | Admitting: *Deleted

## 2018-01-12 NOTE — Telephone Encounter (Signed)
Spoke with pt and reminded pt of remote transmission that is due today. Pt verbalized understanding.   

## 2018-01-14 NOTE — Progress Notes (Signed)
No ICM remote transmission received for 01/12/2018 and next ICM transmission scheduled for 01/28/2018.

## 2018-01-18 ENCOUNTER — Ambulatory Visit (INDEPENDENT_AMBULATORY_CARE_PROVIDER_SITE_OTHER): Payer: Medicaid Other | Admitting: *Deleted

## 2018-01-18 DIAGNOSIS — I428 Other cardiomyopathies: Secondary | ICD-10-CM | POA: Diagnosis not present

## 2018-01-19 ENCOUNTER — Encounter: Payer: Self-pay | Admitting: Cardiology

## 2018-01-19 NOTE — Progress Notes (Signed)
Remote ICD transmission.   

## 2018-01-26 ENCOUNTER — Telehealth: Payer: Self-pay | Admitting: Cardiology

## 2018-01-26 NOTE — Telephone Encounter (Signed)
Spoke w/ pt and requested that he send a manual transmission b/c his home monitor has not updated in at least 7 days.   

## 2018-01-28 ENCOUNTER — Telehealth: Payer: Self-pay

## 2018-01-28 ENCOUNTER — Ambulatory Visit (INDEPENDENT_AMBULATORY_CARE_PROVIDER_SITE_OTHER): Payer: Medicaid Other

## 2018-01-28 DIAGNOSIS — I5022 Chronic systolic (congestive) heart failure: Secondary | ICD-10-CM

## 2018-01-28 DIAGNOSIS — Z95810 Presence of automatic (implantable) cardiac defibrillator: Secondary | ICD-10-CM

## 2018-01-28 NOTE — Telephone Encounter (Signed)
I spoke with the patient about this.

## 2018-02-02 ENCOUNTER — Telehealth: Payer: Self-pay

## 2018-02-02 NOTE — Telephone Encounter (Signed)
Remote ICM transmission received.  Attempted call to patient and left detailed message, per DPR, regarding transmission and next ICM scheduled for 03/08/2018.  Advised to return call for any fluid symptoms or questions.    

## 2018-02-02 NOTE — Progress Notes (Signed)
EPIC Encounter for ICM Monitoring  Patient Name: [name] is a 52 y.o. male Date: 02/02/2018 Primary Care Physican: [name], MD Primary Cardiologist:Crenshaw Electrophysiologist: Allred Dry Weight:Previous weight 242lbs      Attempted call to patient and unable to reach.  Left detailed message, per DPR, regarding transmission.  Transmission reviewed.    Thoracic impedance normal since 01/25/2018.  Prescribed dosage: Furosemide 80 mg 1 tablet twice a day. Klor Con20 mEq 1 tablet daily.  Labs: 11/16/2017 Creatinine 1.78, BUN 21, Potassium 4.2, Sodium 138, EGFR 43-49 09/21/2017 Creatinine1.56, BUN20, Potassium3.6, Sodium139, BWNJ54-23 09/19/2017 Creatinine1.36, BUN16, Potassium4.3, Sodium138, EGFR58->60  09/18/2017 Creatinine1.46, EGFR54->60  09/17/2017 Creatinine1.55, BUN20, Potassium3.6, [id number], PPUG81-66  Recommendations:. Left voice mail with ICM number and encouraged to call if experiencing any fluid symptoms.  Follow-up plan: ICM clinic phone appointment on 03/08/2018.       Copy of ICM check sent to Dr. Rayann Heman.   3 month ICM trend: 01/31/2018    1 Year ICM trend:       [name], RN 02/02/2018 4:42 PM

## 2018-02-04 ENCOUNTER — Telehealth: Payer: Self-pay | Admitting: Cardiology

## 2018-02-04 NOTE — Telephone Encounter (Signed)
New message:         [facility] Pre-operative Risk Assessment    Request for surgical clearance:  1. What type of surgery is being performed? Open repair w/ left inguanal hernia with mesh  2. When is this surgery scheduled? TBD  3. What type of clearance is required (medical clearance vs. Pharmacy clearance to hold med vs. Both)? Both  4. Are there any medications that need to be held prior to surgery and how long? 81 mg aspirin   5. Practice name and name of physician performing surgery? Dr. Randall Hiss Wilson/[facility]   6. What is your office phone number [phone]   7.   What is your office fax number [phone]  8.   Anesthesia type (None, local, MAC, general) ? General    Pt had an appt on 7/30 with Crenshaw and was supposed to be an clearance appt. [facility] states they sent over a clearance form a while back but has yet to receive anything back   Janan Ridge 02/04/2018, 4:17 PM  _________________________________________________________________   (provider comments below)

## 2018-02-08 ENCOUNTER — Other Ambulatory Visit: Payer: Self-pay | Admitting: Internal Medicine

## 2018-02-08 DIAGNOSIS — N401 Enlarged prostate with lower urinary tract symptoms: Principal | ICD-10-CM

## 2018-02-08 DIAGNOSIS — N138 Other obstructive and reflux uropathy: Secondary | ICD-10-CM

## 2018-02-09 ENCOUNTER — Telehealth: Payer: Self-pay | Admitting: Cardiology

## 2018-02-09 NOTE — Telephone Encounter (Signed)
   Primary Cardiologist: [name], MD  Chart reviewed as part of pre-operative protocol coverage. Patient was contacted 02/09/2018 in reference to pre-operative risk assessment for pending surgery as outlined below.  [name] was last seen on 01/05/18 by [name].   Left voice mail to call back between pre-op hours to go over cardiac symptoms.   [employer], [place] 02/09/2018, 2:04 PM

## 2018-02-09 NOTE — Telephone Encounter (Signed)
Spoke w/ pt and requested that he send a manual transmission b/c his home monitor has not updated in at least 7 days.   

## 2018-02-10 NOTE — Telephone Encounter (Signed)
I will route this recommendation to the requesting party via Epic fax function and remove from pre-op pool.  Please call with questions.  [place], [place] 02/10/2018, 3:09 PM

## 2018-02-10 NOTE — Telephone Encounter (Signed)
Yes [name]  

## 2018-02-10 NOTE — Telephone Encounter (Signed)
Patient return call. He is doing well since last office visit. No cardiac issue. Getting > 4 METS of activity.   Given past medical history and time since last visit, based on ACC/AHA guidelines, [name] would be at acceptable risk for the planned procedure without further cardiovascular testing.   [name], is it okay to hold ASA 81mg  for 5-7 days?   [place], [place] 02/10/2018, 2:35 PM

## 2018-02-12 LAB — CUP PACEART REMOTE DEVICE CHECK
Battery Remaining Longevity: 70 mo
Battery Remaining Percentage: 66 %
Battery Voltage: 2.96 V
HighPow Impedance: 68 Ohm
HighPow Impedance: 68 Ohm
Implantable Lead Implant Date: 20150717
Implantable Lead Location: 753860
Implantable Pulse Generator Implant Date: 20150717
Lead Channel Impedance Value: 360 Ohm
Lead Channel Pacing Threshold Amplitude: 0.75 V
Lead Channel Pacing Threshold Pulse Width: 0.5 ms
Lead Channel Sensing Intrinsic Amplitude: 11.8 mV
Lead Channel Setting Pacing Amplitude: 2.5 V
Lead Channel Setting Sensing Sensitivity: 0.5 mV
MDC IDC PG SERIAL: 7196009
MDC IDC SESS DTM: 20190810074749
MDC IDC SET LEADCHNL RV PACING PULSEWIDTH: 0.5 ms
MDC IDC STAT BRADY RV PERCENT PACED: 1 %

## 2018-02-24 ENCOUNTER — Other Ambulatory Visit: Payer: Self-pay | Admitting: Internal Medicine

## 2018-02-24 DIAGNOSIS — Z794 Long term (current) use of insulin: Principal | ICD-10-CM

## 2018-02-24 DIAGNOSIS — E118 Type 2 diabetes mellitus with unspecified complications: Secondary | ICD-10-CM

## 2018-02-25 ENCOUNTER — Other Ambulatory Visit: Payer: Self-pay

## 2018-02-25 ENCOUNTER — Encounter (HOSPITAL_COMMUNITY): Payer: Self-pay | Admitting: *Deleted

## 2018-02-25 NOTE — Progress Notes (Addendum)
Patient was unable to come for a pre-op appointment and I reviewed his medical and surgical history over the phone and reviewed instructions for the morning of his surgery. Patient to come in on 02/26/2018 at 11 am for lab work.  Kelly at [facility] called and informed of this.

## 2018-02-26 ENCOUNTER — Encounter (HOSPITAL_COMMUNITY)
Admission: RE | Admit: 2018-02-26 | Discharge: 2018-02-26 | Disposition: A | Discharge status: Home / Self Care | Payer: Medicaid Other | Source: Ambulatory Visit | Attending: General Surgery | Admitting: General Surgery

## 2018-02-26 DIAGNOSIS — Z01818 Encounter for other preprocedural examination: Secondary | ICD-10-CM | POA: Diagnosis not present

## 2018-02-26 DIAGNOSIS — K4090 Unilateral inguinal hernia, without obstruction or gangrene, not specified as recurrent: Secondary | ICD-10-CM | POA: Diagnosis not present

## 2018-02-26 LAB — BASIC METABOLIC PANEL
ANION GAP: 11 (ref 5–15)
BUN: 23 mg/dL — ABNORMAL HIGH (ref 6–20)
CHLORIDE: 100 mmol/L (ref 98–111)
CO2: 30 mmol/L (ref 22–32)
CREATININE: 1.99 mg/dL — AB (ref 0.61–1.24)
Calcium: 9.3 mg/dL (ref 8.9–10.3)
GFR calc non Af Amer: 37 mL/min — ABNORMAL LOW (ref >60)
GFR, EST AFRICAN AMERICAN: 42 mL/min — AB (ref >60)
Glucose, Bld: 175 mg/dL — ABNORMAL HIGH (ref 70–99)
Potassium: 4.3 mmol/L (ref 3.5–5.1)
SODIUM: 141 mmol/L (ref 135–145)

## 2018-02-26 LAB — CBC
HCT: 36.6 % — ABNORMAL LOW (ref 39.0–52.0)
HEMOGLOBIN: 12.2 g/dL — AB (ref 13.0–17.0)
MCH: 30 pg (ref 26.0–34.0)
MCHC: 33.3 g/dL (ref 30.0–36.0)
MCV: 90.1 fL (ref 78.0–100.0)
Platelets: 284 10*3/uL (ref 150–400)
RBC: 4.06 MIL/uL — AB (ref 4.22–5.81)
RDW: 13.8 % (ref 11.5–15.5)
WBC: 4.7 10*3/uL (ref 4.0–10.5)

## 2018-02-26 LAB — HEMOGLOBIN A1C
HEMOGLOBIN A1C: 7.7 % — AB (ref 4.8–5.6)
Mean Plasma Glucose: 174.29 mg/dL

## 2018-02-26 NOTE — Progress Notes (Signed)
Requested orders for surgery on 03/01/18

## 2018-02-28 ENCOUNTER — Ambulatory Visit: Payer: Self-pay | Admitting: General Surgery

## 2018-02-28 NOTE — Anesthesia Preprocedure Evaluation (Addendum)
Anesthesia Evaluation  Patient identified by MRN, date of birth, ID band Patient awake    Reviewed: Allergy & Precautions, H&P , NPO status , Patient's Chart, lab work & pertinent test results  Airway Mallampati: II  TM Distance: >3 FB Neck ROM: Full    Dental  (+) Teeth Intact   Pulmonary Current Smoker, former smoker,    breath sounds clear to auscultation       Cardiovascular hypertension, Pt. on medications +CHF  + Cardiac Defibrillator + Valvular Problems/Murmurs  Rhythm:Regular Rate:Normal  ECHO 17 LVH. Systolic   function was normal. The estimated ejection fraction was 40%.   Wall motion was normal; there were no regional wall motion   abnormalities. Doppler parameters are consistent with abnormal   left ventricular relaxation (grade 1 diastolic dysfunction).   Neuro/Psych    GI/Hepatic Neg liver ROS, GERD  ,  Endo/Other  diabetes, Poorly Controlled, Type 2  Renal/GU Renal disease     Musculoskeletal   Abdominal   Peds  Hematology   Anesthesia Other Findings   Reproductive/Obstetrics                            Anesthesia Physical  Anesthesia Plan  ASA: III  Anesthesia Plan: General   Post-op Pain Management: GA combined w/ Regional for post-op pain   Induction: Intravenous  PONV Risk Score and Plan: 1 and Ondansetron, Treatment may vary due to age or medical condition and Dexamethasone  Airway Management Planned: Oral ETT  Additional Equipment:   Intra-op Plan:   Post-operative Plan: Extubation in OR  Informed Consent: I have reviewed the patients History and Physical, chart, labs and discussed the procedure including the risks, benefits and alternatives for the proposed anesthesia with the patient or authorized representative who has indicated his/her understanding and acceptance.   Dental advisory given  Plan Discussed with: Anesthesiologist, Surgeon and  CRNA  Anesthesia Plan Comments:         Anesthesia Quick Evaluation

## 2018-03-01 ENCOUNTER — Other Ambulatory Visit: Payer: Self-pay

## 2018-03-01 ENCOUNTER — Ambulatory Visit (HOSPITAL_COMMUNITY): Payer: Medicaid Other | Admitting: Anesthesiology

## 2018-03-01 ENCOUNTER — Observation Stay (HOSPITAL_COMMUNITY)
Admission: RE | Admit: 2018-03-01 | Discharge: 2018-03-02 | Disposition: A | Discharge status: Home / Self Care | Payer: Medicaid Other | Source: Ambulatory Visit | Attending: General Surgery | Admitting: General Surgery

## 2018-03-01 ENCOUNTER — Encounter (HOSPITAL_COMMUNITY): Payer: Self-pay | Admitting: Emergency Medicine

## 2018-03-01 ENCOUNTER — Encounter (HOSPITAL_COMMUNITY)
Admission: RE | Disposition: A | Discharge status: Home / Self Care | Payer: Self-pay | Source: Ambulatory Visit | Attending: General Surgery

## 2018-03-01 DIAGNOSIS — Z87891 Personal history of nicotine dependence: Secondary | ICD-10-CM | POA: Insufficient documentation

## 2018-03-01 DIAGNOSIS — Z7984 Long term (current) use of oral hypoglycemic drugs: Secondary | ICD-10-CM | POA: Diagnosis not present

## 2018-03-01 DIAGNOSIS — I110 Hypertensive heart disease with heart failure: Secondary | ICD-10-CM | POA: Insufficient documentation

## 2018-03-01 DIAGNOSIS — K219 Gastro-esophageal reflux disease without esophagitis: Secondary | ICD-10-CM | POA: Insufficient documentation

## 2018-03-01 DIAGNOSIS — Z7982 Long term (current) use of aspirin: Secondary | ICD-10-CM | POA: Diagnosis not present

## 2018-03-01 DIAGNOSIS — I5022 Chronic systolic (congestive) heart failure: Secondary | ICD-10-CM | POA: Insufficient documentation

## 2018-03-01 DIAGNOSIS — Z79899 Other long term (current) drug therapy: Secondary | ICD-10-CM | POA: Diagnosis not present

## 2018-03-01 DIAGNOSIS — Z8719 Personal history of other diseases of the digestive system: Secondary | ICD-10-CM

## 2018-03-01 DIAGNOSIS — E119 Type 2 diabetes mellitus without complications: Secondary | ICD-10-CM | POA: Diagnosis not present

## 2018-03-01 DIAGNOSIS — Z87442 Personal history of urinary calculi: Secondary | ICD-10-CM | POA: Insufficient documentation

## 2018-03-01 DIAGNOSIS — Z95810 Presence of automatic (implantable) cardiac defibrillator: Secondary | ICD-10-CM | POA: Insufficient documentation

## 2018-03-01 DIAGNOSIS — Z9889 Other specified postprocedural states: Secondary | ICD-10-CM

## 2018-03-01 DIAGNOSIS — G8918 Other acute postprocedural pain: Secondary | ICD-10-CM | POA: Diagnosis not present

## 2018-03-01 DIAGNOSIS — K4090 Unilateral inguinal hernia, without obstruction or gangrene, not specified as recurrent: Secondary | ICD-10-CM | POA: Diagnosis not present

## 2018-03-01 HISTORY — PX: INGUINAL HERNIA REPAIR: SHX194

## 2018-03-01 HISTORY — DX: Presence of automatic (implantable) cardiac defibrillator: Z95.810

## 2018-03-01 LAB — GLUCOSE, CAPILLARY
GLUCOSE-CAPILLARY: 174 mg/dL — AB (ref 70–99)
GLUCOSE-CAPILLARY: 350 mg/dL — AB (ref 70–99)
Glucose-Capillary: 141 mg/dL — ABNORMAL HIGH (ref 70–99)
Glucose-Capillary: 198 mg/dL — ABNORMAL HIGH (ref 70–99)
Glucose-Capillary: 171 mg/dL — ABNORMAL HIGH (ref 70–99)

## 2018-03-01 SURGERY — REPAIR, HERNIA, INGUINAL, ADULT
Anesthesia: General | Laterality: Left

## 2018-03-01 MED ORDER — ONDANSETRON HCL 4 MG/2ML IJ SOLN
INTRAMUSCULAR | Status: AC
  Filled 2018-03-01: qty 2

## 2018-03-01 MED ORDER — FENTANYL CITRATE (PF) 100 MCG/2ML IJ SOLN
25.0000 ug | INTRAMUSCULAR | Status: DC | PRN
  Administered 2018-03-01: 25 ug via INTRAVENOUS
  Administered 2018-03-01: 50 ug via INTRAVENOUS
  Administered 2018-03-01: 25 ug via INTRAVENOUS

## 2018-03-01 MED ORDER — BUPIVACAINE-EPINEPHRINE (PF) 0.25% -1:200000 IJ SOLN
INTRAMUSCULAR | Status: AC
  Filled 2018-03-01: qty 30

## 2018-03-01 MED ORDER — CHLORHEXIDINE GLUCONATE CLOTH 2 % EX PADS: 6.0000 | MEDICATED_PAD | Freq: Once | CUTANEOUS | Status: DC

## 2018-03-01 MED ORDER — GABAPENTIN 300 MG PO CAPS
300.0000 mg | ORAL_CAPSULE | ORAL | Status: AC
  Administered 2018-03-01: 300 mg via ORAL
  Filled 2018-03-01: qty 1

## 2018-03-01 MED ORDER — FENTANYL CITRATE (PF) 100 MCG/2ML IJ SOLN
INTRAMUSCULAR | Status: AC
  Filled 2018-03-01: qty 2

## 2018-03-01 MED ORDER — OXYCODONE HCL 5 MG PO TABS: 2.5000 mg | ORAL_TABLET | Freq: Four times a day (QID) | ORAL | Status: DC | PRN

## 2018-03-01 MED ORDER — CARVEDILOL 25 MG PO TABS
25.0000 mg | ORAL_TABLET | Freq: Two times a day (BID) | ORAL | Status: DC
  Administered 2018-03-01 – 2018-03-02 (×3): 25 mg via ORAL
  Filled 2018-03-01 (×3): qty 1

## 2018-03-01 MED ORDER — ONDANSETRON HCL 4 MG/2ML IJ SOLN
4.0000 mg | Freq: Four times a day (QID) | INTRAMUSCULAR | Status: DC | PRN
  Administered 2018-03-01: 4 mg via INTRAVENOUS
  Filled 2018-03-01: qty 2

## 2018-03-01 MED ORDER — ONDANSETRON HCL 4 MG/2ML IJ SOLN
INTRAMUSCULAR | Status: DC | PRN
  Administered 2018-03-01: 4 mg via INTRAVENOUS

## 2018-03-01 MED ORDER — SACUBITRIL-VALSARTAN 24-26 MG PO TABS
1.0000 | ORAL_TABLET | Freq: Two times a day (BID) | ORAL | Status: DC
  Administered 2018-03-01 – 2018-03-02 (×2): 1 via ORAL
  Filled 2018-03-01 (×2): qty 1

## 2018-03-01 MED ORDER — DEXAMETHASONE SODIUM PHOSPHATE 10 MG/ML IJ SOLN
INTRAMUSCULAR | Status: AC
  Filled 2018-03-01: qty 1

## 2018-03-01 MED ORDER — FENTANYL CITRATE (PF) 100 MCG/2ML IJ SOLN
INTRAMUSCULAR | Status: AC
  Administered 2018-03-01: 25 ug via INTRAVENOUS
  Filled 2018-03-01: qty 2

## 2018-03-01 MED ORDER — FENTANYL CITRATE (PF) 100 MCG/2ML IJ SOLN
INTRAMUSCULAR | Status: DC | PRN
  Administered 2018-03-01: 100 ug via INTRAVENOUS

## 2018-03-01 MED ORDER — ONDANSETRON HCL 4 MG/2ML IJ SOLN: 4.0000 mg | Freq: Once | INTRAMUSCULAR | Status: DC | PRN

## 2018-03-01 MED ORDER — INSULIN ASPART 100 UNIT/ML ~~LOC~~ SOLN
0.0000 [IU] | Freq: Three times a day (TID) | SUBCUTANEOUS | Status: DC
  Administered 2018-03-01: 3 [IU] via SUBCUTANEOUS
  Administered 2018-03-01: 11 [IU] via SUBCUTANEOUS
  Administered 2018-03-02: 3 [IU] via SUBCUTANEOUS
  Administered 2018-03-02: 2 [IU] via SUBCUTANEOUS

## 2018-03-01 MED ORDER — GABAPENTIN 300 MG PO CAPS
300.0000 mg | ORAL_CAPSULE | Freq: Three times a day (TID) | ORAL | Status: DC
  Administered 2018-03-01 – 2018-03-02 (×4): 300 mg via ORAL
  Filled 2018-03-01 (×5): qty 1

## 2018-03-01 MED ORDER — MIDAZOLAM HCL 5 MG/5ML IJ SOLN
INTRAMUSCULAR | Status: DC | PRN
  Administered 2018-03-01: 2 mg via INTRAVENOUS

## 2018-03-01 MED ORDER — SUCCINYLCHOLINE CHLORIDE 200 MG/10ML IV SOSY
PREFILLED_SYRINGE | INTRAVENOUS | Status: AC
  Filled 2018-03-01: qty 10

## 2018-03-01 MED ORDER — CYCLOBENZAPRINE HCL 5 MG PO TABS: 5.0000 mg | ORAL_TABLET | Freq: Three times a day (TID) | ORAL | Status: DC | PRN

## 2018-03-01 MED ORDER — ACETAMINOPHEN 500 MG PO TABS
1000.0000 mg | ORAL_TABLET | ORAL | Status: AC
  Administered 2018-03-01: 1000 mg via ORAL
  Filled 2018-03-01: qty 2

## 2018-03-01 MED ORDER — PANTOPRAZOLE SODIUM 40 MG PO TBEC
40.0000 mg | DELAYED_RELEASE_TABLET | Freq: Every day | ORAL | Status: DC
  Administered 2018-03-01 – 2018-03-02 (×2): 40 mg via ORAL
  Filled 2018-03-01 (×2): qty 1

## 2018-03-01 MED ORDER — LACTATED RINGERS IV SOLN
INTRAVENOUS | Status: DC
  Administered 2018-03-01 (×2): via INTRAVENOUS

## 2018-03-01 MED ORDER — ACETAMINOPHEN 160 MG/5ML PO SOLN: 325.0000 mg | ORAL | Status: DC | PRN

## 2018-03-01 MED ORDER — ROCURONIUM BROMIDE 10 MG/ML (PF) SYRINGE
PREFILLED_SYRINGE | INTRAVENOUS | Status: DC | PRN
  Administered 2018-03-01: 10 mg via INTRAVENOUS
  Administered 2018-03-01: 50 mg via INTRAVENOUS

## 2018-03-01 MED ORDER — ALBUTEROL SULFATE (2.5 MG/3ML) 0.083% IN NEBU: 2.5000 mg | INHALATION_SOLUTION | Freq: Four times a day (QID) | RESPIRATORY_TRACT | Status: DC | PRN

## 2018-03-01 MED ORDER — SIMETHICONE 80 MG PO CHEW: 40.0000 mg | CHEWABLE_TABLET | Freq: Four times a day (QID) | ORAL | Status: DC | PRN

## 2018-03-01 MED ORDER — SUGAMMADEX SODIUM 200 MG/2ML IV SOLN
INTRAVENOUS | Status: AC
  Filled 2018-03-01: qty 2

## 2018-03-01 MED ORDER — PROMETHAZINE HCL 25 MG/ML IJ SOLN
12.5000 mg | Freq: Four times a day (QID) | INTRAMUSCULAR | Status: DC | PRN
  Administered 2018-03-01: 12.5 mg via INTRAVENOUS
  Filled 2018-03-01: qty 1

## 2018-03-01 MED ORDER — OXYCODONE HCL 5 MG/5ML PO SOLN
5.0000 mg | Freq: Once | ORAL | Status: DC | PRN
  Filled 2018-03-01: qty 5

## 2018-03-01 MED ORDER — OXYCODONE HCL 5 MG PO TABS
5.0000 mg | ORAL_TABLET | Freq: Four times a day (QID) | ORAL | Status: DC | PRN
  Administered 2018-03-01: 5 mg via ORAL
  Filled 2018-03-01: qty 1

## 2018-03-01 MED ORDER — MORPHINE SULFATE (PF) 2 MG/ML IV SOLN
1.0000 mg | INTRAVENOUS | Status: DC | PRN
  Administered 2018-03-01: 1 mg via INTRAVENOUS
  Filled 2018-03-01: qty 1

## 2018-03-01 MED ORDER — MEPERIDINE HCL 50 MG/ML IJ SOLN: 6.2500 mg | INTRAMUSCULAR | Status: DC | PRN

## 2018-03-01 MED ORDER — ACETAMINOPHEN 325 MG PO TABS: 325.0000 mg | ORAL_TABLET | ORAL | Status: DC | PRN

## 2018-03-01 MED ORDER — MIDAZOLAM HCL 2 MG/2ML IJ SOLN
INTRAMUSCULAR | Status: AC
  Filled 2018-03-01: qty 2

## 2018-03-01 MED ORDER — LEVALBUTEROL TARTRATE 45 MCG/ACT IN AERO: 1.0000 | INHALATION_SPRAY | Freq: Four times a day (QID) | RESPIRATORY_TRACT | Status: DC | PRN

## 2018-03-01 MED ORDER — DOCUSATE SODIUM 100 MG PO CAPS
100.0000 mg | ORAL_CAPSULE | Freq: Two times a day (BID) | ORAL | Status: DC
  Administered 2018-03-01 – 2018-03-02 (×2): 100 mg via ORAL
  Filled 2018-03-01 (×3): qty 1

## 2018-03-01 MED ORDER — OXYCODONE HCL 5 MG PO TABS: 5.0000 mg | ORAL_TABLET | Freq: Once | ORAL | Status: DC | PRN

## 2018-03-01 MED ORDER — PROPOFOL 10 MG/ML IV BOLUS
INTRAVENOUS | Status: DC | PRN
  Administered 2018-03-01: 200 mg via INTRAVENOUS

## 2018-03-01 MED ORDER — ENOXAPARIN SODIUM 40 MG/0.4ML ~~LOC~~ SOLN
40.0000 mg | SUBCUTANEOUS | Status: DC
  Administered 2018-03-01: 40 mg via SUBCUTANEOUS
  Filled 2018-03-01: qty 0.4

## 2018-03-01 MED ORDER — METFORMIN HCL 500 MG PO TABS: 1000.0000 mg | ORAL_TABLET | ORAL | Status: DC

## 2018-03-01 MED ORDER — DEXAMETHASONE SODIUM PHOSPHATE 10 MG/ML IJ SOLN
INTRAMUSCULAR | Status: DC | PRN
  Administered 2018-03-01: 10 mg via INTRAVENOUS

## 2018-03-01 MED ORDER — PROPOFOL 10 MG/ML IV BOLUS
INTRAVENOUS | Status: AC
  Filled 2018-03-01: qty 20

## 2018-03-01 MED ORDER — DIPHENHYDRAMINE HCL 12.5 MG/5ML PO ELIX: 12.5000 mg | ORAL_SOLUTION | Freq: Four times a day (QID) | ORAL | Status: DC | PRN

## 2018-03-01 MED ORDER — ROPIVACAINE HCL 7.5 MG/ML IJ SOLN
INTRAMUSCULAR | Status: DC | PRN
  Administered 2018-03-01: 30 mL via PERINEURAL

## 2018-03-01 MED ORDER — LIP MEDEX EX OINT
TOPICAL_OINTMENT | CUTANEOUS | Status: AC
  Administered 2018-03-01: 1
  Filled 2018-03-01: qty 7

## 2018-03-01 MED ORDER — ACETAMINOPHEN 500 MG PO TABS
1000.0000 mg | ORAL_TABLET | Freq: Four times a day (QID) | ORAL | Status: DC
  Administered 2018-03-01 – 2018-03-02 (×4): 1000 mg via ORAL
  Filled 2018-03-01 (×5): qty 2

## 2018-03-01 MED ORDER — METFORMIN HCL 500 MG PO TABS
1500.0000 mg | ORAL_TABLET | Freq: Every day | ORAL | Status: DC
  Administered 2018-03-01: 1500 mg via ORAL
  Filled 2018-03-01: qty 3

## 2018-03-01 MED ORDER — METFORMIN HCL 500 MG PO TABS
1000.0000 mg | ORAL_TABLET | Freq: Every day | ORAL | Status: DC
  Administered 2018-03-02: 1000 mg via ORAL
  Filled 2018-03-01: qty 2

## 2018-03-01 MED ORDER — SODIUM CHLORIDE 0.9 % IV SOLN: INTRAVENOUS | Status: DC

## 2018-03-01 MED ORDER — POTASSIUM CHLORIDE CRYS ER 20 MEQ PO TBCR
20.0000 meq | EXTENDED_RELEASE_TABLET | Freq: Every day | ORAL | Status: DC
  Administered 2018-03-01 – 2018-03-02 (×2): 20 meq via ORAL
  Filled 2018-03-01 (×2): qty 1

## 2018-03-01 MED ORDER — CEFAZOLIN SODIUM-DEXTROSE 2-4 GM/100ML-% IV SOLN
2.0000 g | INTRAVENOUS | Status: AC
  Administered 2018-03-01: 2 g via INTRAVENOUS
  Filled 2018-03-01: qty 100

## 2018-03-01 MED ORDER — ATORVASTATIN CALCIUM 10 MG PO TABS
10.0000 mg | ORAL_TABLET | Freq: Every day | ORAL | Status: DC
  Administered 2018-03-01 – 2018-03-02 (×2): 10 mg via ORAL
  Filled 2018-03-01 (×2): qty 1

## 2018-03-01 MED ORDER — 0.9 % SODIUM CHLORIDE (POUR BTL) OPTIME
TOPICAL | Status: DC | PRN
  Administered 2018-03-01: 1000 mL

## 2018-03-01 MED ORDER — ROCURONIUM BROMIDE 10 MG/ML (PF) SYRINGE
PREFILLED_SYRINGE | INTRAVENOUS | Status: AC
  Filled 2018-03-01: qty 10

## 2018-03-01 MED ORDER — FUROSEMIDE 40 MG PO TABS
80.0000 mg | ORAL_TABLET | Freq: Two times a day (BID) | ORAL | Status: DC
  Administered 2018-03-01 – 2018-03-02 (×2): 80 mg via ORAL
  Filled 2018-03-01 (×3): qty 2

## 2018-03-01 MED ORDER — BUPIVACAINE-EPINEPHRINE 0.25% -1:200000 IJ SOLN
INTRAMUSCULAR | Status: DC | PRN
  Administered 2018-03-01: 20 mL

## 2018-03-01 MED ORDER — LIDOCAINE 2% (20 MG/ML) 5 ML SYRINGE
INTRAMUSCULAR | Status: DC | PRN
  Administered 2018-03-01: 100 mg via INTRAVENOUS

## 2018-03-01 MED ORDER — LIDOCAINE 2% (20 MG/ML) 5 ML SYRINGE
INTRAMUSCULAR | Status: AC
  Filled 2018-03-01: qty 5

## 2018-03-01 MED ORDER — TAMSULOSIN HCL 0.4 MG PO CAPS
0.8000 mg | ORAL_CAPSULE | Freq: Every day | ORAL | Status: DC
  Administered 2018-03-01 – 2018-03-02 (×2): 0.8 mg via ORAL
  Filled 2018-03-01 (×2): qty 2

## 2018-03-01 MED ORDER — ONDANSETRON 4 MG PO TBDP: 4.0000 mg | ORAL_TABLET | Freq: Four times a day (QID) | ORAL | Status: DC | PRN

## 2018-03-01 MED ORDER — DIPHENHYDRAMINE HCL 50 MG/ML IJ SOLN: 12.5000 mg | Freq: Four times a day (QID) | INTRAMUSCULAR | Status: DC | PRN

## 2018-03-01 SURGICAL SUPPLY — 32 items
BENZOIN TINCTURE PRP APPL 2/3 (GAUZE/BANDAGES/DRESSINGS) ×2 IMPLANT
COVER SURGICAL LIGHT HANDLE (MISCELLANEOUS) ×2 IMPLANT
DECANTER SPIKE VIAL GLASS SM (MISCELLANEOUS) ×2 IMPLANT
DRAIN PENROSE 18X1/2 LTX STRL (DRAIN) ×2 IMPLANT
DRAPE LAPAROTOMY T 98X78 PEDS (DRAPES) ×2 IMPLANT
DRSG TEGADERM 4X4.75 (GAUZE/BANDAGES/DRESSINGS) ×2 IMPLANT
ELECT REM PT RETURN 15FT ADLT (MISCELLANEOUS) ×2 IMPLANT
GAUZE SPONGE 4X4 12PLY STRL (GAUZE/BANDAGES/DRESSINGS) ×2 IMPLANT
GLOVE BIO SURGEON STRL SZ7.5 (GLOVE) ×2 IMPLANT
GLOVE BIOGEL PI IND STRL 7.0 (GLOVE) ×2 IMPLANT
GLOVE BIOGEL PI INDICATOR 7.0 (GLOVE) ×2
GLOVE ECLIPSE 7.0 STRL STRAW (GLOVE) ×2 IMPLANT
GLOVE INDICATOR 8.0 STRL GRN (GLOVE) ×2 IMPLANT
GLOVE SURG SS PI 7.0 STRL IVOR (GLOVE) ×2 IMPLANT
GOWN STRL REUS W/ TWL LRG LVL3 (GOWN DISPOSABLE) ×2 IMPLANT
GOWN STRL REUS W/ TWL LRG LVL4 (GOWN DISPOSABLE) ×2 IMPLANT
GOWN STRL REUS W/TWL LRG LVL3 (GOWN DISPOSABLE) ×2
GOWN STRL REUS W/TWL LRG LVL4 (GOWN DISPOSABLE) ×2
KIT BASIN OR (CUSTOM PROCEDURE TRAY) ×2 IMPLANT
MESH ULTRAPRO 3X6 7.6X15CM (Mesh General) ×2 IMPLANT
NEEDLE HYPO 22GX1.5 SAFETY (NEEDLE) ×2 IMPLANT
PACK GENERAL/GYN (CUSTOM PROCEDURE TRAY) ×2 IMPLANT
STRIP CLOSURE SKIN 1/2X4 (GAUZE/BANDAGES/DRESSINGS) ×2 IMPLANT
SUT MNCRL AB 4-0 PS2 18 (SUTURE) ×2 IMPLANT
SUT PROLENE 2 0 CT2 30 (SUTURE) ×4 IMPLANT
SUT VIC AB 2-0 SH 27 (SUTURE) ×1
SUT VIC AB 2-0 SH 27X BRD (SUTURE) ×1 IMPLANT
SUT VIC AB 3-0 SH 18 (SUTURE) ×2 IMPLANT
SYR 20CC LL (SYRINGE) ×2 IMPLANT
TOWEL OR 17X26 10 PK STRL BLUE (TOWEL DISPOSABLE) ×2 IMPLANT
TOWEL OR NON WOVEN STRL DISP B (DISPOSABLE) ×2 IMPLANT
TRAY FOLEY CATH 16FR SILVER (SET/KITS/TRAYS/PACK) ×2 IMPLANT

## 2018-03-01 NOTE — Discharge Instructions (Signed)
[facility]: POST OP INSTRUCTIONS Always review your discharge instruction sheet given to you by the facility where your surgery was performed. IF YOU HAVE DISABILITY OR FAMILY LEAVE FORMS, YOU MUST BRING THEM TO THE OFFICE FOR PROCESSING.   DO NOT GIVE THEM TO YOUR DOCTOR.  PAIN CONTROL  1. First take acetaminophen (Tylenol) AND/or ibuprofen (Advil) to control your pain after surgery.  Follow directions on package.  Taking acetaminophen (Tylenol) and/or ibuprofen (Advil) regularly after surgery will help to control your pain and lower the amount of prescription pain medication you may need.  You should not take more than 4,000 mg (4 grams) of acetaminophen (Tylenol) in 24 hours.  You should not take ibuprofen (Advil), aleve, motrin, naprosyn or other NSAIDS if you have a history of stomach ulcers or chronic kidney disease or on a blood thinner 2. A prescription for pain medication may be given to you upon discharge.  Take your pain medication as prescribed, if you still have uncontrolled pain after taking acetaminophen (Tylenol) or ibuprofen (Advil). 3. Use ice packs to help control pain. 4. If you need a refill on your pain medication, please contact your pharmacy.  They will contact our office to request authorization. Prescriptions will not be filled after 5pm or on week-ends.  HOME MEDICATIONS 5. Take your usually prescribed medications unless otherwise directed.  DIET 6. You should follow a light diet the first few days after arrival home.  Be sure to include lots of fluids daily. Avoid fatty, fried foods.   CONSTIPATION 7. It is common to experience some constipation after surgery and if you are taking pain medication.  Increasing fluid intake and taking a stool softener (such as Colace) will usually help or prevent this problem from occurring.  A mild laxative (Milk of Magnesia or Miralax) should be taken according to package instructions if  there are no bowel movements after 48 hours.  WOUND/INCISION CARE 8. Most patients will experience some swelling and bruising in the area of the incisions.  Ice packs will help.  Swelling and bruising can take several days to resolve.  9. Unless discharge instructions indicate otherwise, follow guidelines below  a. STERI-STRIPS - you may remove your outer bandages 48 hours after surgery, and you may shower at that time.  You have steri-strips (small skin tapes) in place directly over the incision.  These strips should be left on the skin for 7-10 days.   b. DERMABOND/SKIN GLUE - you may shower in 24 hours.  The glue will flake off over the next 2-3 weeks. 10. Any sutures or staples will be removed at the office during your follow-up visit.  ACTIVITIES 11. You may resume regular (light) daily activities beginning the next day--such as daily self-care, walking, climbing stairs--gradually increasing activities as tolerated.  You may have sexual intercourse when it is comfortable.  Refrain from any heavy lifting or straining until approved by your doctor. a. You may drive when you are no longer taking prescription pain medication, you can comfortably wear a seatbelt, and you can safely maneuver your car and apply brakes.  FOLLOW-UP 12. You should see your doctor in the office for a follow-up appointment approximately 2-3 weeks after your surgery.  You should have been given your post-op/follow-up appointment when your surgery was scheduled.  If you did not receive a post-op/follow-up appointment, make sure that you call for this appointment within a day or two after you arrive home to insure a convenient  appointment time.  OTHER INSTRUCTIONS 13. DO NOT LIFT/PUSH/PULL ANYTHING GREATER THAN 10 LBS FOR 1 MONTH.   WHEN TO CALL YOUR DOCTOR: 1. Fever over 101.0 2. Inability to urinate 3. Continued bleeding from incision. 4. Increased pain, redness, or drainage from the incision. 5. Increasing abdominal  pain  The clinic staff is available to answer your questions during regular business hours.  Please dont hesitate to call and ask to speak to one of the nurses for clinical concerns.  If you have a medical emergency, go to the nearest emergency room or call 911.  A surgeon from [facility] is always on call at the hospital. [address] [place], [place], [place]  [address] ? [address], [place], [place]   [address] [phone] ? [phone] ? FAX [phone] Web site: [email or web address]

## 2018-03-01 NOTE — Anesthesia Procedure Notes (Signed)
Anesthesia Regional Block: TAP block   Pre-Anesthetic Checklist: ,, timeout performed, Correct Patient, Correct Site, Correct Laterality, Correct Procedure, Correct Position, site marked, Risks and benefits discussed,  Surgical consent,  Pre-op evaluation,  At surgeon's request and post-op pain management  Laterality: Left  Prep: chloraprep       Needles:  Injection technique: Single-shot  Needle Type: Echogenic Stimulator Needle     Needle Length: 5cm  Needle Gauge: 22     Additional Needles:   Procedures:, nerve stimulator,,, ultrasound used (permanent image in chart),,,,  Narrative:  Start time: 03/01/2018 7:05 AM End time: 03/01/2018 7:10 AM Injection made incrementally with aspirations every 5 mL.  Performed by: Personally  Anesthesiologist: Janeece Riggers, MD  Additional Notes: Functioning IV was confirmed and monitors were applied.  A 48mm 22ga Arrow echogenic stimulator needle was used. Sterile prep and drape,hand hygiene and sterile gloves were used. Ultrasound guidance: relevant anatomy identified, needle position confirmed, local anesthetic spread visualized around nerve(s)., vascular puncture avoided.  Image printed for medical record. Negative aspiration and negative test dose prior to incremental administration of local anesthetic. The patient tolerated the procedure well.

## 2018-03-01 NOTE — Op Note (Signed)
[name] [id number] Aug 13, 1965 03/01/2018  Open Left Indirect Inguinal Hernia Repair with Mesh Procedure Note  Indications: The patient presented with a history of a left, reducible hernia.    Pre-operative Diagnosis: left reducible  Post-operative Diagnosis: Large left indirect inguinal hernia   Surgeon: Greer Pickerel MD FACS  Assistants: none  Anesthesia: General endotracheal anesthesia and TAP block  Surgeon: Leighton Ruff. Redmond Pulling, MD, FACS  Procedure Details  The patient was seen again in the Holding Room. The risks, benefits, complications, treatment options, and expected outcomes were discussed with the patient. The possibilities of reaction to medication, pulmonary aspiration, perforation of viscus, bleeding, recurrent infection, the need for additional procedures, and development of a complication requiring transfusion or further operation were discussed with the patient and/or family. The likelihood of success in repairing the hernia and returning the patient to their previous functional status is good.  There was concurrence with the proposed plan, and informed consent was obtained. The site of surgery was properly noted/marked. The patient was taken to the Operating Room, identified as [name], and the procedure verified as left inguinal hernia repair. A Time Out was held and the above information confirmed.  The patient was placed in the supine position and underwent induction of anesthesia. The lower abdomen and groin was prepped with Chloraprep and draped in the standard fashion, and 0.25% Marcaine with epinephrine was used to anesthetize the skin over the mid-portion of the inguinal canal. An oblique incision was made. Dissection was carried down through the subcutaneous tissue with cautery to the external oblique fascia. He had a small tear in the upper portion of external oblique aponeurosis.  We opened the external oblique fascia along the direction of its fibers to the  external ring.  The spermatic cord was circumferentially dissected bluntly and retracted with a Penrose drain.  There was a very large bulky indirect hernia. The floor of the inguinal canal was inspected and there was no over direct defect however there was laxity in the floor.  We skeletonized the spermatic cord and isolated the hernia sac. I was able to reduce the sac contents which made dealing with the hernia sac and cord structures much easier.  The ilioinguinal nerve was identified and preserved.  We used a 3 x 6 inch piece of Ultrapro mesh, which was cut into a keyhole shape.  This was secured with 2-0 Prolene, beginning at the pubic tubercle, running this along the shelving edge inferiorly. Superiorly, the mesh was secured to the internal oblique fascia with interrupted 2-0 Prolene sutures.  The tails of the mesh were sutured together behind the spermatic cord.  The mesh was tucked underneath the external oblique fascia laterally.  The external oblique fascia was reapproximated with 2-0 Vicryl.  3-0 Vicryl was used to close the subcutaneous tissues and 4-0 Monocryl was used to close the skin in subcuticular fashion.  Benzoin and steri-strips were used to seal the incision.  A clean dressing was applied.  The patient was then extubated and brought to the recovery room in stable condition.  All sponge, instrument, and needle counts were correct prior to closure and at the conclusion of the case.   Estimated Blood Loss: Minimal                 Complications: None; patient tolerated the procedure well.         Disposition: PACU - hemodynamically stable.         Condition: stable  Leighton Ruff.  Redmond Pulling, MD, FACS General, Bariatric, & Minimally Invasive Surgery [facility], [place]

## 2018-03-01 NOTE — Anesthesia Procedure Notes (Signed)
Procedure Name: Intubation Date/Time: 03/01/2018 7:40 AM Performed by: Lind Covert, CRNA Pre-anesthesia Checklist: Patient identified, Emergency Drugs available, Suction available, Patient being monitored and Timeout performed Patient Re-evaluated:Patient Re-evaluated prior to induction Oxygen Delivery Method: Circle system utilized Preoxygenation: Pre-oxygenation with 100% oxygen Induction Type: IV induction Ventilation: Mask ventilation without difficulty Laryngoscope Size: Mac and 4 Grade View: Grade I Tube type: Oral Tube size: 7.5 mm Number of attempts: 1 Airway Equipment and Method: Stylet Placement Confirmation: ETT inserted through vocal cords under direct vision,  positive ETCO2 and breath sounds checked- equal and bilateral Secured at: 22 cm Tube secured with: Tape Dental Injury: Teeth and Oropharynx as per pre-operative assessment

## 2018-03-01 NOTE — Transfer of Care (Signed)
Immediate Anesthesia Transfer of Care Note  Patient: [name]  Procedure(s) Performed: OPEN REPAIR OF LEFT INGUINAL HERNIA WITH MESH (Left )  Patient Location: PACU  Anesthesia Type:General and Regional  Level of Consciousness: sedated  Airway & Oxygen Therapy: Patient Spontanous Breathing and Patient connected to face mask oxygen  Post-op Assessment: Report given to RN and Post -op Vital signs reviewed and stable  Post vital signs: Reviewed and stable  Last Vitals:  Vitals Value Taken Time  BP 134/89 03/01/2018  9:19 AM  Temp    Pulse 85 03/01/2018  9:20 AM  Resp 17 03/01/2018  9:20 AM  SpO2 96 % 03/01/2018  9:20 AM  Vitals shown include unvalidated device data.  Last Pain:  Vitals:   03/01/18 0617  TempSrc:   PainSc: 0-No pain      Patients Stated Pain Goal: 4 (28/00/34 9179)  Complications: No apparent anesthesia complications

## 2018-03-01 NOTE — Progress Notes (Signed)
Dr. Ambrose Pancoast notified that pt did not take carvedilol this morning, last dose was last night at 2300. Dr. Ambrose Pancoast stated pt does not need another dose prior to surgery.

## 2018-03-01 NOTE — H&P (Signed)
[name] is an 52 y.o. male.   Chief Complaint: here for surgery HPI: 52 yo AAM seen several months ago for left inguinal hernia presents today for elective repair.  Denies medical changes since seen in office in May  The patient is a 52 year old male who presents with an inguinal hernia. He is sent in by Dr. Wynetta Emery to evaluate a left inguinal hernia. He states it is been present for at least 10 years. He was recently in the hospital on April for a left scrotal abscess that required incision and drainage. He states that he is completely resolved from there. He states the bulge is generally always there. At times it is firm. He describes an occasional tingling sensation in his left groin. He denies any nausea, vomiting, diarrhea or constipation. Has a daily bowel movement. He does endorse a weak stream and nocturia. He does have an AICD. He denies any systemic blood thinner other than aspirin. He no longer smokes. He does have a history of heart failure as well as diabetes mellitus. He had a CT scan when he presented with a scrotal abscess which confirmed a large inguinal hernia containing some sigmoid colon-nonobstructive  Past Medical History:  Diagnosis Date  . AICD (automatic cardioverter/defibrillator) present   . Chronic bronchitis ([place])    "get it most q yr" (12/23/2013)  . Chronic systolic (congestive) heart failure ([place])   . Fracture of left humerus    a. 07/2013.  Marland Kitchen GERD (gastroesophageal reflux disease)    not at present time  . Heart murmur    "born w/it"   . History of renal calculi   . Hypertension   . Kidney stones   . NICM (nonischemic cardiomyopathy) ([place])    a. 07/2013 Echo: EF 20-25%, diff HK, Gr2 DD, mild MR, mod dil LA/RA. EF 40% 2017 echo  . Obesity   . 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 Syncope   . Type II diabetes mellitus  ([place]) 2006    Past Surgical History:  Procedure Laterality Date  . CARDIAC CATHETERIZATION  08/10/13  . CHOLECYSTECTOMY  ~ 2010  . IMPLANTABLE CARDIOVERTER DEFIBRILLATOR IMPLANT  12/23/2013   STJ Fortify ICD implanted by [name] for cardiomyopathy and syncope  . IMPLANTABLE CARDIOVERTER DEFIBRILLATOR IMPLANT N/A 12/23/2013   Procedure: IMPLANTABLE CARDIOVERTER DEFIBRILLATOR IMPLANT;  Surgeon: Coralyn Mark, MD;  Location: [place] CATH LAB;  Service: Cardiovascular;  Laterality: N/A;  . LEFT HEART CATHETERIZATION WITH CORONARY ANGIOGRAM N/A 08/10/2013   Procedure: LEFT HEART CATHETERIZATION WITH CORONARY ANGIOGRAM;  Surgeon: Jettie Booze, MD;  Location: [facility] C[facility] LAB;  Service: Cardiovascular;  Laterality: N/A;  . ORIF HUMERUS FRACTURE Left 08/12/2013   Procedure: OPEN REDUCTION INTERNAL FIXATION (ORIF) HUMERAL SHAFT FRACTURE;  Surgeon: Newt Minion, MD;  Location: [place];  Service: Orthopedics;  Laterality: Left;  Open Reduction Internal Fixation Left Humerus  . URETEROSCOPY     "laser for kidney stones"    Family History  Problem Relation Age of Onset  . Diabetes Father   . Arthritis Father   . Hypertension Father   . Diabetes Mother   . Arthritis Mother   . Hypertension Mother   . Hypertension Brother    Social History:  reports that he quit smoking about 3 years ago. His smoking use included cigarettes. He has a 28.00 pack-year smoking history. He has never used  smokeless tobacco. He reports that he drinks alcohol. He reports that he has current or past drug history. Drug: Marijuana.  Allergies:  Allergies  Allergen Reactions  . Bee Venom Anaphylaxis    Medications Prior to Admission  Medication Sig Dispense Refill  . aspirin 81 MG tablet Take 81 mg by mouth daily.    Marland Kitchen atorvastatin (LIPITOR) 10 MG tablet Take 1 tablet (10 mg total) daily by mouth. 30 tablet 11  . carvedilol (COREG) 25 MG tablet Take 1 tablet (25 mg total) by mouth 2 (two) times daily. 180 tablet 3  .  cyclobenzaprine (FLEXERIL) 5 MG tablet TAKE 1 TABLET BY MOUTH THREE TIMES DAILY AS NEEDED FOR MUSCLE SPASM (Patient taking differently: Take 5 mg by mouth 3 (three) times daily as needed for muscle spasms. ) 60 tablet 0  . diphenhydrAMINE (BENADRYL) 50 MG capsule Take 50 mg by mouth at bedtime.    . furosemide (LASIX) 80 MG tablet Take 1 tablet (80 mg total) by mouth 2 (two) times daily. 180 tablet 3  . gabapentin (NEURONTIN) 300 MG capsule Take 1 capsule (300 mg total) by mouth 3 (three) times daily. 90 capsule 6  . metFORMIN (GLUCOPHAGE) 1000 MG tablet TAKE ONE TABLET BY MOUTH ONCE DAILY IN THE MORNING AND 1.5 (ONE & ONE-HALF) TABLETS DAILY IN THE EVENING (Patient taking differently: Take 1,000-1,500 mg by mouth See admin instructions. Take 1000 mg by mouth in the morning and take 1500 mg by mouth at bedtime) 75 tablet 0  . oxyCODONE (OXY IR/ROXICODONE) 5 MG immediate release tablet Take 1 tablet (5 mg total) by mouth every 6 (six) hours as needed for moderate pain. 8 tablet 0  . pantoprazole (PROTONIX) 40 MG tablet TAKE 1 TABLET BY MOUTH ONCE DAILY (Patient taking differently: Take 40 mg by mouth daily. ) 90 tablet 0  . potassium chloride SA (KLOR-CON M20) 20 MEQ tablet Take 1 tablet ( 20 meq) by mouth daily or as directed. (Patient taking differently: Take 20 mEq by mouth daily. ) 90 tablet 3  . sacubitril-valsartan (ENTRESTO) 24-26 MG Take 1 tablet by mouth 2 (two) times daily. 180 tablet 3  . sitaGLIPtin (JANUVIA) 100 MG tablet Take 1 tablet (100 mg total) by mouth daily. 90 tablet 3  . tamsulosin (FLOMAX) 0.4 MG CAPS capsule Take 2 capsules (0.8 mg total) by mouth daily. (Patient taking differently: Take 0.4 mg by mouth 2 (two) times daily. ) 60 capsule 5  . ACCU-CHEK AVIVA PLUS test strip USE ONE STRIP THREE TIMES DAILY (Patient not taking: Reported on 02/24/2018) 100 each 12  . Blood Glucose Monitoring Suppl (ACCU-CHEK AVIVA PLUS) W/DEVICE KIT 1 Device by Does not apply route 3 (three) times  daily after meals. (Patient not taking: Reported on 02/24/2018) 1 kit 0  . Lancet Devices (ACCU-CHEK SOFTCLIX) lancets 1 each by Other route 3 (three) times daily. (Patient not taking: Reported on 02/24/2018) 1 each 0  . levalbuterol (XOPENEX HFA) 45 MCG/ACT inhaler Inhale 1-2 puffs into the lungs every 6 (six) hours as needed for wheezing.      Results for orders placed or performed during the hospital encounter of 03/01/18 (from the past 48 hour(s))  Glucose, capillary     Status: Abnormal   Collection Time: 03/01/18  6:01 AM  Result Value Ref Range   Glucose-Capillary 141 (H) 70 - 99 mg/dL   Comment 1 Notify RN    Comment 2 Document in Chart    No results found.  Review of Systems  Constitutional: Negative for weight loss.  HENT: Negative for nosebleeds.   Eyes: Negative for blurred vision.  Respiratory: Negative for shortness of breath.   Cardiovascular: Negative for chest pain, palpitations, orthopnea and PND.       Denies DOE  Genitourinary: Negative for dysuria and hematuria.  Musculoskeletal: Negative.   Skin: Negative for itching and rash.  Neurological: Negative for dizziness, focal weakness, seizures, loss of consciousness and headaches.       Denies TIAs, amaurosis fugax  Endo/Heme/Allergies: Does not bruise/bleed easily.  Psychiatric/Behavioral: The patient is not nervous/anxious.     Blood pressure 122/80, pulse 82, temperature 97.6 F (36.4 C), temperature source Oral, resp. rate 18, height _0  (1.854 m), weight 109.3 kg, SpO2 98 %. Physical Exam  Vitals reviewed. Constitutional: He is oriented to person, place, and time. He appears well-developed and well-nourished. No distress.  HENT:  Head: Normocephalic and atraumatic.  Right Ear: External ear normal.  Left Ear: External ear normal.  Eyes: Conjunctivae are normal. No scleral icterus.  Neck: Normal range of motion. Neck supple. No tracheal deviation present. No thyromegaly present.  Cardiovascular: Normal  rate and normal heart sounds.  Respiratory: Effort normal and breath sounds normal. No stridor. No respiratory distress. He has no wheezes.  AICD  GI: Soft. He exhibits no distension. There is no tenderness. A hernia is present. Hernia confirmed positive in the left inguinal area. Hernia confirmed negative in the right inguinal area.  Musculoskeletal: He exhibits no edema or tenderness.  Lymphadenopathy:    He has no cervical adenopathy.  Neurological: He is alert and oriented to person, place, and time. He exhibits normal muscle tone.  Skin: Skin is warm and dry. No rash noted. He is not diaphoretic. No erythema. No pallor.  Psychiatric: He has a normal mood and affect. His behavior is normal. Judgment and thought content normal.     Assessment/Plan Left inguinal hernia CAD/AICD H/o heart failure DM GERD  To OR for [facility] repair with mesh Iv abx Eras meds All questions asked and answered   Leighton Ruff. Redmond Pulling, MD, FACS General, Bariatric, & Minimally Invasive Surgery [facility], [place]   Greer Pickerel, MD 03/01/2018, 7:28 AM

## 2018-03-01 NOTE — Anesthesia Postprocedure Evaluation (Signed)
Anesthesia Post Note  Patient: [name]  Procedure(s) Performed: OPEN REPAIR OF LEFT INGUINAL HERNIA WITH MESH (Left )     Patient location during evaluation: PACU Anesthesia Type: General Level of consciousness: awake and alert Pain management: pain level controlled Vital Signs Assessment: post-procedure vital signs reviewed and stable Respiratory status: spontaneous breathing, nonlabored ventilation, respiratory function stable and patient connected to nasal cannula oxygen Cardiovascular status: blood pressure returned to baseline and stable Postop Assessment: no apparent nausea or vomiting Anesthetic complications: no    Last Vitals:  Vitals:   03/01/18 0605 03/01/18 0919  BP: 122/80 134/89  Pulse: 82 75  Resp: 18 17  Temp: 36.4 C (!) 36.2 C  SpO2: 98% 97%    Last Pain:  Vitals:   03/01/18 0617  TempSrc:   PainSc: 0-No pain                 Paarth Cropper

## 2018-03-02 ENCOUNTER — Encounter (HOSPITAL_COMMUNITY): Payer: Self-pay | Admitting: General Surgery

## 2018-03-02 DIAGNOSIS — Z95810 Presence of automatic (implantable) cardiac defibrillator: Secondary | ICD-10-CM | POA: Diagnosis not present

## 2018-03-02 DIAGNOSIS — E119 Type 2 diabetes mellitus without complications: Secondary | ICD-10-CM | POA: Diagnosis not present

## 2018-03-02 DIAGNOSIS — Z87442 Personal history of urinary calculi: Secondary | ICD-10-CM | POA: Diagnosis not present

## 2018-03-02 DIAGNOSIS — K4090 Unilateral inguinal hernia, without obstruction or gangrene, not specified as recurrent: Secondary | ICD-10-CM | POA: Diagnosis not present

## 2018-03-02 DIAGNOSIS — I5022 Chronic systolic (congestive) heart failure: Secondary | ICD-10-CM | POA: Diagnosis not present

## 2018-03-02 DIAGNOSIS — K219 Gastro-esophageal reflux disease without esophagitis: Secondary | ICD-10-CM | POA: Diagnosis not present

## 2018-03-02 DIAGNOSIS — I110 Hypertensive heart disease with heart failure: Secondary | ICD-10-CM | POA: Diagnosis not present

## 2018-03-02 DIAGNOSIS — Z7984 Long term (current) use of oral hypoglycemic drugs: Secondary | ICD-10-CM | POA: Diagnosis not present

## 2018-03-02 DIAGNOSIS — Z79899 Other long term (current) drug therapy: Secondary | ICD-10-CM | POA: Diagnosis not present

## 2018-03-02 DIAGNOSIS — Z87891 Personal history of nicotine dependence: Secondary | ICD-10-CM | POA: Diagnosis not present

## 2018-03-02 DIAGNOSIS — Z7982 Long term (current) use of aspirin: Secondary | ICD-10-CM | POA: Diagnosis not present

## 2018-03-02 LAB — GLUCOSE, CAPILLARY
GLUCOSE-CAPILLARY: 146 mg/dL — AB (ref 70–99)
Glucose-Capillary: 169 mg/dL — ABNORMAL HIGH (ref 70–99)

## 2018-03-02 MED ORDER — OXYCODONE HCL 5 MG PO TABS: 5.0000 mg | ORAL_TABLET | Freq: Four times a day (QID) | ORAL | 0 refills | Status: DC | PRN

## 2018-03-02 NOTE — Progress Notes (Signed)
Pt alert, oriented, tolerating diet.  D/C instructions given, all questions answered.  Pt d/cd home.

## 2018-03-02 NOTE — Discharge Summary (Signed)
Physician Discharge Summary  [name] HFW:[id number] DOB: [birth year] DOA: 03/01/2018  PCP: [name], MD  Admit date: 03/01/2018 Discharge date: 03/02/18  Recommendations for Outpatient Follow-up:    Follow-up Information    Greer Pickerel, MD. Schedule an appointment as soon as possible for a visit in 4 week(s).   Specialty:  General Surgery Contact information: [address] [place] [address] [phone]          Discharge Diagnoses:  1. Left inguinal hernia s/p open repair 2. HTN 3. Chronic Heart failure 4. DM 2 5. GERD 6. Obesity  Surgical Procedure: open repair of left inguinal hernia with mesh 03/01/18  Discharge Condition: good Disposition: hom  Diet recommendation: cardiac/diabetic  Filed Weights   03/01/18 0617  Weight: 109.3 kg    History of present illness:  52 year old African-American male with history of hypertension, chronic systolic heart failure, diabetes mellitus, hypertension, obesity presented with large symptomatic left inguinal hernia.  He was cleared by cardiology for surgery.  Hospital Course:  The patient was brought to hospital for planned elective open repair of left inguinal hernia with mesh.  He received a tap block by anesthesia.  Please see operative note for further details.  He was kept overnight for observation due to his comorbidities as well as for social reasons that she lives alone.  He did well after surgery.  He initially had some nausea but that resolved.  On day of discharge he was tolerating a diet.  His vital signs were stable.  His pain was controlled.  He had voided without difficulty as well as had a bowel movement.  We discussed discharge instructions extensively.  BP 119/69 (BP Location: Right Arm)   Pulse 66   Temp 97.7 F (36.5 C) (Oral)   Resp 19   Ht 6' 1"  (1.854 m)   Wt 109.3 kg   SpO2 93%   BMI 31.80 kg/m   Gen: alert, NAD, non-toxic appearing Pupils: equal, no scleral  icterus Pulm: Lungs clear to auscultation, symmetric chest rise CV: regular rate and rhythm Abd: soft, nontender, nondistended. Mild swelling in L groin dressing c/d/i.  No cellulitis. No incisional hernia Ext: no edema, no calf tenderness Skin: no rash, no jaundice    Discharge Instructions  Discharge Instructions    Call MD for:   Complete by:  As directed    Temperature >101   Call MD for:  hives   Complete by:  As directed    Call MD for:  persistant dizziness or light-headedness   Complete by:  As directed    Call MD for:  persistant nausea and vomiting   Complete by:  As directed    Call MD for:  redness, tenderness, or signs of infection (pain, swelling, redness, odor or green/yellow discharge around incision site)   Complete by:  As directed    Call MD for:  severe uncontrolled pain   Complete by:  As directed    Diet - low sodium heart healthy   Complete by:  As directed    Discharge instructions   Complete by:  As directed    See [facility] discharge instructions   Increase activity slowly   Complete by:  As directed      Allergies as of 03/02/2018      Reactions   Bee Venom Anaphylaxis      Medication List    TAKE these medications   ACCU-CHEK AVIVA PLUS test strip Generic drug:  glucose blood USE ONE STRIP THREE TIMES DAILY   ACCU-CHEK AVIVA PLUS w/Device Kit 1 Device by Does not apply route 3 (three) times daily after meals.   accu-chek softclix lancets 1 each by Other route 3 (three) times daily.   aspirin 81 MG tablet Take 81 mg by mouth daily.   atorvastatin 10 MG tablet Commonly known as:  LIPITOR Take 1 tablet (10 mg total) daily by mouth.   carvedilol 25 MG tablet Commonly known as:  COREG Take 1 tablet (25 mg total) by mouth 2 (two) times daily.   cyclobenzaprine 5 MG tablet Commonly known as:  FLEXERIL TAKE 1 TABLET BY MOUTH THREE TIMES DAILY AS NEEDED FOR MUSCLE SPASM What changed:    reasons to take this  additional instructions    diphenhydrAMINE 50 MG capsule Commonly known as:  BENADRYL Take 50 mg by mouth at bedtime.   furosemide 80 MG tablet Commonly known as:  LASIX Take 1 tablet (80 mg total) by mouth 2 (two) times daily.   gabapentin 300 MG capsule Commonly known as:  NEURONTIN Take 1 capsule (300 mg total) by mouth 3 (three) times daily.   levalbuterol 45 MCG/ACT inhaler Commonly known as:  XOPENEX HFA Inhale 1-2 puffs into the lungs every 6 (six) hours as needed for wheezing.   metFORMIN 1000 MG tablet Commonly known as:  GLUCOPHAGE TAKE ONE TABLET BY MOUTH ONCE DAILY IN THE MORNING AND 1.5 (ONE & ONE-HALF) TABLETS DAILY IN THE EVENING What changed:    how much to take  how to take this  when to take this  additional instructions   oxyCODONE 5 MG immediate release tablet Commonly known as:  Oxy IR/ROXICODONE Take 1 tablet (5 mg total) by mouth every 6 (six) hours as needed for breakthrough pain. What changed:  reasons to take this   pantoprazole 40 MG tablet Commonly known as:  PROTONIX TAKE 1 TABLET BY MOUTH ONCE DAILY   potassium chloride SA 20 MEQ tablet Commonly known as:  K-DUR,KLOR-CON Take 1 tablet ( 20 meq) by mouth daily or as directed. What changed:    how much to take  how to take this  when to take this  additional instructions   sacubitril-valsartan 24-26 MG Commonly known as:  ENTRESTO Take 1 tablet by mouth 2 (two) times daily.   sitaGLIPtin 100 MG tablet Commonly known as:  JANUVIA Take 1 tablet (100 mg total) by mouth daily.   tamsulosin 0.4 MG Caps capsule Commonly known as:  FLOMAX Take 2 capsules (0.8 mg total) by mouth daily. What changed:    how much to take  when to take this      Follow-up Information    Greer Pickerel, MD. Schedule an appointment as soon as possible for a visit in 4 week(s).   Specialty:  General Surgery Contact information: [place] [address] [phone]            The results of  significant diagnostics from this hospitalization (including imaging, microbiology, ancillary and laboratory) are listed below for reference.    Significant Diagnostic Studies: No results found.  Microbiology: No results found for this or any previous visit (from the past 240 hour(s)).   Labs: Basic Metabolic Panel: Recent Labs  Lab 02/26/18 1134  NA 141  K 4.3  CL 100  CO2 30  GLUCOSE 175*  BUN 23*  CREATININE 1.99*  CALCIUM 9.3   Liver Function Tests: No results for input(s): AST,  ALT, ALKPHOS, BILITOT, PROT, ALBUMIN in the last 168 hours. No results for input(s): LIPASE, AMYLASE in the last 168 hours. No results for input(s): AMMONIA in the last 168 hours. CBC: Recent Labs  Lab 02/26/18 1134  WBC 4.7  HGB 12.2*  HCT 36.6*  MCV 90.1  PLT 284   Cardiac Enzymes: No results for input(s): CKTOTAL, CKMB, CKMBINDEX, TROPONINI in the last 168 hours. BNP: BNP (last 3 results) No results for input(s): BNP in the last 8760 hours.  ProBNP (last 3 results) No results for input(s): PROBNP in the last 8760 hours.  CBG: Recent Labs  Lab 03/01/18 0926 03/01/18 1204 03/01/18 1722 03/01/18 2151 03/02/18 0742  GLUCAP 174* 198* 350* 171* 146*    Active Problems:   S/P inguinal hernia repair   Time coordinating discharge: 15 min  Signed:  Gayland Curry, MD [facility], [place] [phone] 03/02/2018, 10:40 AM

## 2018-03-08 ENCOUNTER — Telehealth: Payer: Self-pay | Admitting: Cardiology

## 2018-03-08 NOTE — Telephone Encounter (Signed)
Spoke with pt and reminded pt of remote transmission that is due today. Pt verbalized understanding.   

## 2018-03-22 ENCOUNTER — Telehealth: Payer: Self-pay | Admitting: Cardiology

## 2018-03-22 NOTE — Progress Notes (Signed)
No ICM remote transmission received for 03/08/2018 and next ICM transmission scheduled for 04/01/2018.

## 2018-03-22 NOTE — Telephone Encounter (Signed)
Spoke w/ pt and requested that he send a manual transmission b/c his home monitor has not updated in at least 7 days.   

## 2018-03-28 ENCOUNTER — Other Ambulatory Visit: Payer: Self-pay | Admitting: Internal Medicine

## 2018-03-28 DIAGNOSIS — M545 Low back pain, unspecified: Secondary | ICD-10-CM

## 2018-03-28 DIAGNOSIS — G8929 Other chronic pain: Secondary | ICD-10-CM

## 2018-04-01 ENCOUNTER — Telehealth: Payer: Self-pay

## 2018-04-01 NOTE — Telephone Encounter (Signed)
Spoke with pt and reminded pt of remote transmission that is due today. Pt verbalized understanding.  Monthly check with Margarita Grizzle

## 2018-04-02 NOTE — Progress Notes (Signed)
No ICM remote transmission received for 04/01/2018 and next ICM transmission scheduled for 05/20/2018 since he has office defib check with [name] 04/19/2018.

## 2018-04-04 ENCOUNTER — Other Ambulatory Visit: Payer: Self-pay | Admitting: Internal Medicine

## 2018-04-04 DIAGNOSIS — E118 Type 2 diabetes mellitus with unspecified complications: Secondary | ICD-10-CM

## 2018-04-04 DIAGNOSIS — Z794 Long term (current) use of insulin: Principal | ICD-10-CM

## 2018-04-05 ENCOUNTER — Other Ambulatory Visit: Payer: Self-pay | Admitting: Internal Medicine

## 2018-04-05 DIAGNOSIS — Z794 Long term (current) use of insulin: Principal | ICD-10-CM

## 2018-04-05 DIAGNOSIS — E118 Type 2 diabetes mellitus with unspecified complications: Secondary | ICD-10-CM

## 2018-04-06 NOTE — Telephone Encounter (Signed)
[name] could you call and schedule pt an appointment  

## 2018-04-07 ENCOUNTER — Emergency Department (HOSPITAL_COMMUNITY)
Admission: EM | Admit: 2018-04-07 | Discharge: 2018-04-07 | Disposition: A | Discharge status: Home / Self Care | Payer: Medicaid Other | Attending: Emergency Medicine | Admitting: Emergency Medicine

## 2018-04-07 ENCOUNTER — Encounter (HOSPITAL_COMMUNITY): Payer: Self-pay

## 2018-04-07 DIAGNOSIS — Z5321 Procedure and treatment not carried out due to patient leaving prior to being seen by health care provider: Secondary | ICD-10-CM | POA: Insufficient documentation

## 2018-04-07 DIAGNOSIS — L299 Pruritus, unspecified: Secondary | ICD-10-CM | POA: Diagnosis not present

## 2018-04-07 NOTE — ED Notes (Signed)
Pt stated that he is not going to wait this long. Reports that his rash is getting better and will follow up with his primary doctor. This RN attempted to get pt to stay. Pt left.

## 2018-04-07 NOTE — ED Triage Notes (Signed)
Pt states that last night he broke out in hives, he took benadryl, it went away and then returned again tonight, took 3 benadryl PTA, denies new soaps, lotion, foods or meds, denies SOB.

## 2018-04-19 ENCOUNTER — Ambulatory Visit (INDEPENDENT_AMBULATORY_CARE_PROVIDER_SITE_OTHER): Payer: Medicaid Other | Admitting: Internal Medicine

## 2018-04-19 ENCOUNTER — Ambulatory Visit (INDEPENDENT_AMBULATORY_CARE_PROVIDER_SITE_OTHER): Payer: Medicaid Other | Admitting: *Deleted

## 2018-04-19 ENCOUNTER — Telehealth: Payer: Self-pay | Admitting: Cardiology

## 2018-04-19 ENCOUNTER — Encounter: Payer: Self-pay | Admitting: Internal Medicine

## 2018-04-19 VITALS — BP 112/80 | HR 78 | Ht 73.0 in | Wt 256.2 lb

## 2018-04-19 DIAGNOSIS — R079 Chest pain, unspecified: Secondary | ICD-10-CM | POA: Diagnosis not present

## 2018-04-19 DIAGNOSIS — I5022 Chronic systolic (congestive) heart failure: Secondary | ICD-10-CM

## 2018-04-19 DIAGNOSIS — Z95810 Presence of automatic (implantable) cardiac defibrillator: Secondary | ICD-10-CM | POA: Diagnosis not present

## 2018-04-19 DIAGNOSIS — G8929 Other chronic pain: Secondary | ICD-10-CM

## 2018-04-19 DIAGNOSIS — I428 Other cardiomyopathies: Secondary | ICD-10-CM | POA: Diagnosis not present

## 2018-04-19 DIAGNOSIS — I472 Ventricular tachycardia: Secondary | ICD-10-CM | POA: Diagnosis not present

## 2018-04-19 DIAGNOSIS — I4729 Other ventricular tachycardia: Secondary | ICD-10-CM

## 2018-04-19 MED ORDER — ATORVASTATIN CALCIUM 10 MG PO TABS: 10.0000 mg | ORAL_TABLET | Freq: Every day | ORAL | 11 refills | Status: DC

## 2018-04-19 NOTE — Patient Instructions (Addendum)
Medication Instructions:  Your physician recommends that you continue on your current medications as directed. Please refer to the Current Medication list given to you today.  Labwork: None ordered.  Testing/Procedures: None ordered.  Follow-Up: Your physician wants you to follow-up in: one year with [name] Marshall, NP.   You will receive a reminder letter in the mail two months in advance. If you don't receive a letter, please call our office to schedule the follow-up appointment.  Remote monitoring is used to monitor your ICD from home. This monitoring reduces the number of office visits required to check your device to one time per year. It allows [place] to keep an eye on the functioning of your device to ensure it is working properly. You are scheduled for a device check from home on 05/20/2018. You may send your transmission at any time that day. If you have a wireless device, the transmission will be sent automatically. After your physician reviews your transmission, you will receive a postcard with your next transmission date.  Any Other Special Instructions Will Be Listed Below (If Applicable).  If you need a refill on your cardiac medications before your next appointment, please call your pharmacy.

## 2018-04-19 NOTE — Telephone Encounter (Signed)
Spoke with pt and reminded pt of remote transmission that is due today. Pt verbalized understanding.   

## 2018-04-19 NOTE — Progress Notes (Signed)
PCP: [name], MD Primary Cardiologist: Dr Stanford Breed Primary EP: [name]  [name] is a 52 y.o. male who presents today for routine electrophysiology followup.  Since last being seen in our clinic, the patient reports doing reasonably well.  He is "tired" today.  Reports that his mother has had another stroke.  He is her caregiver.  He has also had a rash of unclear origin for which he takes bendryl which makes him tired.  Today, he denies symptoms of palpitations, chest pain, shortness of breath,  lower extremity edema, dizziness, presyncope, syncope, or ICD shocks.  The patient is otherwise without complaint today.   Past Medical History:  Diagnosis Date  . AICD (automatic cardioverter/defibrillator) present   . Chronic bronchitis ([place])    "get it most q yr" (12/23/2013)  . Chronic systolic (congestive) heart failure ([place])   . Fracture of left humerus    a. 07/2013.  Marland Kitchen GERD (gastroesophageal reflux disease)    not at present time  . Heart murmur    "born w/it"   . History of renal calculi   . Hypertension   . Kidney stones   . NICM (nonischemic cardiomyopathy) ([place])    a. 07/2013 Echo: EF 20-25%, diff HK, Gr2 DD, mild MR, mod dil LA/RA. EF 40% 2017 echo  . Obesity   . 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 Syncope   . Type II diabetes mellitus ([place]) 2006   Past Surgical History:  Procedure Laterality Date  . CARDIAC CATHETERIZATION  08/10/13  . CHOLECYSTECTOMY  ~ 2010  . IMPLANTABLE CARDIOVERTER DEFIBRILLATOR IMPLANT  12/23/2013   STJ Fortify ICD implanted by [name] for cardiomyopathy and syncope  . IMPLANTABLE CARDIOVERTER DEFIBRILLATOR IMPLANT N/A 12/23/2013   Procedure: IMPLANTABLE CARDIOVERTER DEFIBRILLATOR IMPLANT;  Surgeon: Coralyn Mark, MD;  Location: [facility] CATH LAB;  Service: Cardiovascular;  Laterality: N/A;  . INGUINAL HERNIA  REPAIR Left 03/01/2018   Procedure: OPEN REPAIR OF LEFT INGUINAL HERNIA WITH MESH;  Surgeon: Greer Pickerel, MD;  Location: [facility];  Service: General;  Laterality: Left;  . LEFT HEART CATHETERIZATION WITH CORONARY ANGIOGRAM N/A 08/10/2013   Procedure: LEFT HEART CATHETERIZATION WITH CORONARY ANGIOGRAM;  Surgeon: Jettie Booze, MD;  Location: [facility] CATH LAB;  Service: Cardiovascular;  Laterality: N/A;  . ORIF HUMERUS FRACTURE Left 08/12/2013   Procedure: OPEN REDUCTION INTERNAL FIXATION (ORIF) HUMERAL SHAFT FRACTURE;  Surgeon: Newt Minion, MD;  Location: [facility] [facility];  Service: Orthopedics;  Laterality: Left;  Open Reduction Internal Fixation Left Humerus  . URETEROSCOPY     "laser for kidney stones"    ROS- all systems are reviewed and negative except as per HPI above  Current Outpatient Medications  Medication Sig Dispense Refill  . ACCU-CHEK AVIVA PLUS test strip USE ONE STRIP THREE TIMES DAILY 100 each 12  . aspirin 81 MG tablet Take 81 mg by mouth daily.    Marland Kitchen atorvastatin (LIPITOR) 10 MG tablet Take 1 tablet (10 mg total) by mouth daily. 30 tablet 11  . Blood Glucose Monitoring Suppl (ACCU-CHEK AVIVA PLUS) W/DEVICE KIT 1 Device by Does not apply route 3 (three) times daily after meals. 1 kit 0  . carvedilol (COREG) 25 MG tablet Take 1 tablet (25 mg total) by mouth 2 (two) times daily. 180 tablet 3  . cyclobenzaprine (FLEXERIL) 5 MG tablet TAKE 1 TABLET BY MOUTH THREE TIMES  DAILY AS NEEDED FOR MUSCLE SPASM (Patient taking differently: Take 5 mg by mouth 3 (three) times daily as needed for muscle spasms. ) 60 tablet 0  . diphenhydrAMINE (BENADRYL) 50 MG capsule Take 50 mg by mouth at bedtime.    . furosemide (LASIX) 80 MG tablet Take 1 tablet (80 mg total) by mouth 2 (two) times daily. 180 tablet 3  . gabapentin (NEURONTIN) 300 MG capsule Take 1 capsule (300 mg total) by mouth 3 (three) times daily. 90 capsule 6  . Lancet Devices (ACCU-CHEK SOFTCLIX) lancets 1 each by Other route 3 (three) times  daily. 1 each 0  . levalbuterol (XOPENEX HFA) 45 MCG/ACT inhaler Inhale 1-2 puffs into the lungs every 6 (six) hours as needed for wheezing.    . metFORMIN (GLUCOPHAGE) 1000 MG tablet TAKE 1 TABLET BY MOUTH ONCE DAILY IN THE MORNING AND 1 & 1/2 (ONE & ONE-HALF) ONCE DAILY IN THE EVENING 75 tablet 0  . oxyCODONE (OXY IR/ROXICODONE) 5 MG immediate release tablet Take 1 tablet (5 mg total) by mouth every 6 (six) hours as needed for breakthrough pain. 15 tablet 0  . pantoprazole (PROTONIX) 40 MG tablet TAKE 1 TABLET BY MOUTH ONCE DAILY (Patient taking differently: Take 40 mg by mouth daily. ) 90 tablet 0  . potassium chloride SA (KLOR-CON M20) 20 MEQ tablet Take 1 tablet ( 20 meq) by mouth daily or as directed. (Patient taking differently: Take 20 mEq by mouth daily. ) 90 tablet 3  . sacubitril-valsartan (ENTRESTO) 24-26 MG Take 1 tablet by mouth 2 (two) times daily. 180 tablet 3  . sitaGLIPtin (JANUVIA) 100 MG tablet Take 1 tablet (100 mg total) by mouth daily. 90 tablet 3  . tamsulosin (FLOMAX) 0.4 MG CAPS capsule Take 2 capsules (0.8 mg total) by mouth daily. (Patient taking differently: Take 0.4 mg by mouth 2 (two) times daily. ) 60 capsule 5   No current facility-administered medications for this visit.     Physical Exam: Vitals:   04/19/18 1407  BP: 112/80  Pulse: 78  SpO2: 95%  Weight: 256 lb 3.2 oz (116.2 kg)  Height: 6' 1"  (1.854 m)    GEN- The patient is well appearing, alert and oriented x 3 today.   Head- normocephalic, atraumatic Eyes-  Sclera clear, conjunctiva pink Ears- hearing intact Oropharynx- clear Lungs- Clear to ausculation bilaterally, normal work of breathing Chest- ICD pocket is well healed Heart- Regular rate and rhythm, no murmurs, rubs or gallops, PMI not laterally displaced GI- soft, NT, ND, + BS Extremities- no clubbing, cyanosis, or edema  ICD interrogation- reviewed in detail today,  See PACEART report  ekg tracing 11/16/17 (declines ekg today) is  personally reviewed and shows sinus rhythm 86 bpm, PR 172 msec, QRS 112 msec, Qtc 437 msec  Wt Readings from Last 3 Encounters:  04/19/18 256 lb 3.2 oz (116.2 kg)  03/01/18 241 lb (109.3 kg)  01/05/18 242 lb 6.4 oz (110 kg)    Assessment and Plan:  1.  Chronic systolic dysfunction/ nonischemic CM euvolemic today Stable on an appropriate medical regimen Normal ICD function See [facility] report No changes today followed in ICM device clinic Labs 02/26/18 reviewed Chronic renal insufficiency (stage IV) limits medical therapy I have offered referral for Beat HF trial today.  He will think about it but is not currently interested. Given fatigue, I have offered sleep study which he also declines. We discussed SJM battery integrity alert today.  Battery intergrety performance upgrade also performed and  vibratory alert demonstrated.  2. NSVT/ prior syncope No sustained arrhythmias Very rare NSVT  3. Overweight Body mass index is 33.8 kg/m. Lifestyle modification encouraged   4. Chronic atypical chest pains Stable No change required today  Merlin Return to see EP NP annually I will see when needed   Thompson Grayer MD, [facility] 04/19/2018 2:41 PM

## 2018-04-20 NOTE — Progress Notes (Signed)
Remote ICD transmission.   

## 2018-04-21 LAB — CUP PACEART INCLINIC DEVICE CHECK
Date Time Interrogation Session: 20191111200207
HighPow Impedance: 63 Ohm
Implantable Lead Implant Date: 20150717
Implantable Lead Location: 753860
Implantable Pulse Generator Implant Date: 20150717
Lead Channel Pacing Threshold Amplitude: 0.75 V
Lead Channel Pacing Threshold Amplitude: 0.75 V
Lead Channel Pacing Threshold Pulse Width: 0.5 ms
Lead Channel Sensing Intrinsic Amplitude: 11.8 mV
Lead Channel Setting Pacing Amplitude: 2.5 V
MDC IDC MSMT BATTERY REMAINING LONGEVITY: 69 mo
MDC IDC MSMT LEADCHNL RV IMPEDANCE VALUE: 350 Ohm
MDC IDC MSMT LEADCHNL RV PACING THRESHOLD PULSEWIDTH: 0.5 ms
MDC IDC PG SERIAL: 7196009
MDC IDC SET LEADCHNL RV PACING PULSEWIDTH: 0.5 ms
MDC IDC SET LEADCHNL RV SENSING SENSITIVITY: 0.5 mV
MDC IDC STAT BRADY RV PERCENT PACED: 0 %

## 2018-04-30 ENCOUNTER — Ambulatory Visit: Payer: Medicaid Other | Attending: Internal Medicine | Admitting: Internal Medicine

## 2018-04-30 ENCOUNTER — Encounter: Payer: Self-pay | Admitting: Internal Medicine

## 2018-04-30 VITALS — BP 105/72 | HR 78 | Temp 98.1°F | Resp 16 | Wt 252.4 lb

## 2018-04-30 DIAGNOSIS — L309 Dermatitis, unspecified: Secondary | ICD-10-CM | POA: Diagnosis not present

## 2018-04-30 DIAGNOSIS — E1142 Type 2 diabetes mellitus with diabetic polyneuropathy: Secondary | ICD-10-CM | POA: Diagnosis not present

## 2018-04-30 DIAGNOSIS — K219 Gastro-esophageal reflux disease without esophagitis: Secondary | ICD-10-CM | POA: Insufficient documentation

## 2018-04-30 DIAGNOSIS — Z9049 Acquired absence of other specified parts of digestive tract: Secondary | ICD-10-CM | POA: Diagnosis not present

## 2018-04-30 DIAGNOSIS — L259 Unspecified contact dermatitis, unspecified cause: Secondary | ICD-10-CM | POA: Diagnosis not present

## 2018-04-30 DIAGNOSIS — I5022 Chronic systolic (congestive) heart failure: Secondary | ICD-10-CM | POA: Diagnosis not present

## 2018-04-30 DIAGNOSIS — Z87891 Personal history of nicotine dependence: Secondary | ICD-10-CM | POA: Insufficient documentation

## 2018-04-30 DIAGNOSIS — Z95810 Presence of automatic (implantable) cardiac defibrillator: Secondary | ICD-10-CM | POA: Insufficient documentation

## 2018-04-30 DIAGNOSIS — E118 Type 2 diabetes mellitus with unspecified complications: Secondary | ICD-10-CM | POA: Diagnosis not present

## 2018-04-30 DIAGNOSIS — Z79891 Long term (current) use of opiate analgesic: Secondary | ICD-10-CM | POA: Insufficient documentation

## 2018-04-30 DIAGNOSIS — E119 Type 2 diabetes mellitus without complications: Secondary | ICD-10-CM

## 2018-04-30 DIAGNOSIS — Z79899 Other long term (current) drug therapy: Secondary | ICD-10-CM | POA: Insufficient documentation

## 2018-04-30 DIAGNOSIS — Z794 Long term (current) use of insulin: Secondary | ICD-10-CM | POA: Diagnosis not present

## 2018-04-30 DIAGNOSIS — R55 Syncope and collapse: Secondary | ICD-10-CM | POA: Diagnosis not present

## 2018-04-30 DIAGNOSIS — L509 Urticaria, unspecified: Secondary | ICD-10-CM

## 2018-04-30 DIAGNOSIS — Z833 Family history of diabetes mellitus: Secondary | ICD-10-CM | POA: Insufficient documentation

## 2018-04-30 DIAGNOSIS — Z8249 Family history of ischemic heart disease and other diseases of the circulatory system: Secondary | ICD-10-CM | POA: Diagnosis not present

## 2018-04-30 DIAGNOSIS — Z7982 Long term (current) use of aspirin: Secondary | ICD-10-CM | POA: Diagnosis not present

## 2018-04-30 DIAGNOSIS — I429 Cardiomyopathy, unspecified: Secondary | ICD-10-CM | POA: Diagnosis not present

## 2018-04-30 LAB — GLUCOSE, POCT (MANUAL RESULT ENTRY): POC Glucose: 151 mg/dl — AB (ref 70–99)

## 2018-04-30 MED ORDER — SITAGLIPTIN PHOSPHATE 100 MG PO TABS: 100.0000 mg | ORAL_TABLET | Freq: Every day | ORAL | 3 refills | Status: DC

## 2018-04-30 MED ORDER — PREDNISONE 20 MG PO TABS: ORAL_TABLET | ORAL | 3 refills | Status: DC

## 2018-04-30 MED ORDER — PANTOPRAZOLE SODIUM 40 MG PO TBEC: 40.0000 mg | DELAYED_RELEASE_TABLET | Freq: Every day | ORAL | 0 refills | Status: DC

## 2018-04-30 MED ORDER — CYCLOBENZAPRINE HCL 5 MG PO TABS: 5.0000 mg | ORAL_TABLET | Freq: Three times a day (TID) | ORAL | 0 refills | Status: DC | PRN

## 2018-04-30 MED ORDER — MUPIROCIN CALCIUM 2 % EX CREA: TOPICAL_CREAM | CUTANEOUS | 0 refills | Status: DC

## 2018-04-30 MED ORDER — TRIAMCINOLONE ACETONIDE 0.1 % EX CREA: TOPICAL_CREAM | CUTANEOUS | 0 refills | Status: DC

## 2018-04-30 MED ORDER — FLUTICASONE PROPIONATE 50 MCG/ACT NA SUSP: 1.0000 | Freq: Every day | NASAL | 0 refills | Status: DC

## 2018-04-30 NOTE — Progress Notes (Signed)
Patient ID: [name], male    DOB: [birth year]  MRN: [id number]  CC: Hospitalization Follow-up   Subjective: [name] is a 52 y.o. male who presents for chronic disease management His concerns today include:  Patient with history of ICM, systolic CHF status post ICD placement, DM type 2, HL, obesity, BPH, and chronic lower back pain secondary to spondylosis and spinal stenosis.  Since last visit with me he had inguinal hernia repair.  He has some numbness over the incision site over the left side of the abdomen.  States he was told by the surgeon that he may have cut a small nerve and the feeling may come back eventually.  What is more concerning to him is that over the past 2 weeks he started noticing a little bit of drainage on his underwear from where the incision was.  Area has been sore since surgery  C/o have hives intermittently for a few years.  However over the past several weeks episodes have become more frequent and almost daily.  The episodes are also more extensive involving his entire body.  He denies using any new body products.  He is not on any new medications.  He is not aware of any food allergies and has not associated his symptoms to any particular foods.  He used to take Benadryl once a day but recently increased to twice a day due to increased frequency.  He tried Claritin without much benefit.  He went to the emergency room on 04/07/2018 for the same but did not wait to be seen.  He denies any swelling of the lips throat or tongue.  He has noticed a lot of nasal congestion.  No discolored mucus from the nose.  Has noticed a rash on the dorsal surface of the left big toe x1 month.  It itches.  No known initiating factors.   Patient Active Problem List   Diagnosis Date Noted  . S/P inguinal hernia repair 03/01/2018  . Cellulitis 09/18/2017  . Abscess 09/18/2017  . Abscess of groin   . Unilateral inguinal hernia without obstruction or gangrene 07/24/2017  .  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  . Hypersomnia 02/28/2016  . Abnormal PFT 02/24/2016  . Diabetic polyneuropathy associated with type 2 diabetes mellitus ([place]) 12/18/2015  . Lumbar back pain 12/18/2015  . Controlled type 2 diabetes mellitus with complication, with long-term current use of insulin ([place]) 09/17/2015  . 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 Lesion of right median nerve at forearm 04/16/2015  . ICD (implantable cardioverter-defibrillator) in place 03/28/2014  . Chronic systolic congestive heart failure ([place]) 03/28/2014  . Ventricular tachycardia, non-sustained ([place]) 03/27/2014  . Nonischemic cardiomyopathy ([place]) 11/09/2013  . Chronic systolic dysfunction of left ventricle 11/09/2013  . Syncope 10/20/2013  . Chronic chest pain 07/28/2013  . 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 RENAL CALCULUS 05/13/2009  . Obesity 05/08/2009  . Former heavy cigarette smoker (20-39 per day) 05/08/2009  . HOARSENESS 05/08/2009  . ANXIETY 02/28/2008  . GERD 02/28/2008  . IRRITABLE BOWEL SYNDROME 02/28/2008     Current Outpatient Medications on File Prior to Visit  Medication Sig Dispense Refill  . ACCU-CHEK AVIVA PLUS test strip USE ONE STRIP THREE TIMES DAILY 100 each 12  . aspirin 81 MG tablet Take 81 mg by mouth daily.    Marland Kitchen atorvastatin (LIPITOR) 10 MG tablet Take 1 tablet (10 mg total) by mouth daily. 30 tablet 11  . Blood Glucose  Monitoring Suppl (ACCU-CHEK AVIVA PLUS) W/DEVICE KIT 1 Device by Does not apply route 3 (three) times daily after meals. 1 kit 0  . carvedilol (COREG) 25 MG tablet Take 1 tablet (25 mg total) by mouth 2 (two) times daily. 180 tablet 3  . cyclobenzaprine (FLEXERIL) 5 MG tablet TAKE 1 TABLET BY MOUTH THREE TIMES DAILY AS NEEDED FOR MUSCLE SPASM (Patient taking differently: Take 5 mg by mouth 3 (three) times daily as needed for muscle spasms. ) 60 tablet 0  . diphenhydrAMINE (BENADRYL) 50 MG capsule Take 50 mg by mouth  at bedtime.    . furosemide (LASIX) 80 MG tablet Take 1 tablet (80 mg total) by mouth 2 (two) times daily. 180 tablet 3  . gabapentin (NEURONTIN) 300 MG capsule Take 1 capsule (300 mg total) by mouth 3 (three) times daily. 90 capsule 6  . Lancet Devices (ACCU-CHEK SOFTCLIX) lancets 1 each by Other route 3 (three) times daily. 1 each 0  . levalbuterol (XOPENEX HFA) 45 MCG/ACT inhaler Inhale 1-2 puffs into the lungs every 6 (six) hours as needed for wheezing.    . metFORMIN (GLUCOPHAGE) 1000 MG tablet TAKE 1 TABLET BY MOUTH ONCE DAILY IN THE MORNING AND 1 & 1/2 (ONE & ONE-HALF) ONCE DAILY IN THE EVENING 75 tablet 0  . oxyCODONE (OXY IR/ROXICODONE) 5 MG immediate release tablet Take 1 tablet (5 mg total) by mouth every 6 (six) hours as needed for breakthrough pain. (Patient not taking: Reported on 04/30/2018) 15 tablet 0  . pantoprazole (PROTONIX) 40 MG tablet TAKE 1 TABLET BY MOUTH ONCE DAILY (Patient taking differently: Take 40 mg by mouth daily. ) 90 tablet 0  . potassium chloride SA (KLOR-CON M20) 20 MEQ tablet Take 1 tablet ( 20 meq) by mouth daily or as directed. (Patient taking differently: Take 20 mEq by mouth daily. ) 90 tablet 3  . sacubitril-valsartan (ENTRESTO) 24-26 MG Take 1 tablet by mouth 2 (two) times daily. 180 tablet 3  . sitaGLIPtin (JANUVIA) 100 MG tablet Take 1 tablet (100 mg total) by mouth daily. 90 tablet 3  . tamsulosin (FLOMAX) 0.4 MG CAPS capsule Take 2 capsules (0.8 mg total) by mouth daily. (Patient taking differently: Take 0.4 mg by mouth 2 (two) times daily. ) 60 capsule 5   No current facility-administered medications on file prior to visit.     Allergies  Allergen Reactions  . Bee Venom Anaphylaxis    Social History   Socioeconomic History  . Marital status: Single    Spouse name: Not on file  . Number of children: Not on file  . Years of education: Not on file  . Highest education level: Not on file  Occupational History  . Occupation: Disabled  Social  Needs  . Financial resource strain: Not on file  . Food insecurity:    Worry: Not on file    Inability: Not on file  . Transportation needs:    Medical: Not on file    Non-medical: Not on file  Tobacco Use  . Smoking status: Former Smoker    Packs/day: 1.00    Years: 28.00    Pack years: 28.00    Types: Cigarettes    Last attempt to quit: 06/03/2014    Years since quitting: 3.9  . Smokeless tobacco: Never Used  Substance and Sexual Activity  . Alcohol use: Yes    Alcohol/week: 0.0 standard drinks    Comment: 12/23/2013 "might drink a couple times/yr"  . Drug use: Yes  Types: Marijuana    Comment: 02/25/18- patient  states has not used marjuana  . Sexual activity: Yes  Lifestyle  . Physical activity:    Days per week: Not on file    Minutes per session: Not on file  . Stress: Not on file  Relationships  . Social connections:    Talks on phone: Not on file    Gets together: Not on file    Attends religious service: Not on file    Active member of club or organization: Not on file    Attends meetings of clubs or organizations: Not on file    Relationship status: Not on file  . Intimate partner violence:    Fear of current or ex partner: Not on file    Emotionally abused: Not on file    Physically abused: Not on file    Forced sexual activity: Not on file  Other Topics Concern  . Not on file  Social History Narrative   Pt lives with his brother     Family History  Problem Relation Age of Onset  . Diabetes Father   . Arthritis Father   . Hypertension Father   . Diabetes Mother   . Arthritis Mother   . Hypertension Mother   . Hypertension Brother     Past Surgical History:  Procedure Laterality Date  . CARDIAC CATHETERIZATION  08/10/13  . CHOLECYSTECTOMY  ~ 2010  . IMPLANTABLE CARDIOVERTER DEFIBRILLATOR IMPLANT  12/23/2013   STJ Fortify ICD implanted by [name] for cardiomyopathy and syncope  . IMPLANTABLE CARDIOVERTER DEFIBRILLATOR IMPLANT N/A 12/23/2013    Procedure: IMPLANTABLE CARDIOVERTER DEFIBRILLATOR IMPLANT;  Surgeon: Coralyn Mark, MD;  Location: [facility] CATH LAB;  Service: Cardiovascular;  Laterality: N/A;  . INGUINAL HERNIA REPAIR Left 03/01/2018   Procedure: OPEN REPAIR OF LEFT INGUINAL HERNIA WITH MESH;  Surgeon: Greer Pickerel, MD;  Location: [facility];  Service: General;  Laterality: Left;  . LEFT HEART CATHETERIZATION WITH CORONARY ANGIOGRAM N/A 08/10/2013   Procedure: LEFT HEART CATHETERIZATION WITH CORONARY ANGIOGRAM;  Surgeon: Jettie Booze, MD;  Location: [facility] CATH LAB;  Service: Cardiovascular;  Laterality: N/A;  . ORIF HUMERUS FRACTURE Left 08/12/2013   Procedure: OPEN REDUCTION INTERNAL FIXATION (ORIF) HUMERAL SHAFT FRACTURE;  Surgeon: Newt Minion, MD;  Location: [facility];  Service: Orthopedics;  Laterality: Left;  Open Reduction Internal Fixation Left Humerus  . URETEROSCOPY     "laser for kidney stones"    ROS: Review of Systems Negative except as above. PHYSICAL EXAM: BP 105/72   Pulse 78   Temp 98.1 F (36.7 C) (Oral)   Resp 16   Wt 252 lb 6.4 oz (114.5 kg)   SpO2 97%   BMI 33.30 kg/m   Physical Exam General appearance - alert, well appearing, and in no distress Mental status - normal mood, behavior, speech, dress, motor activity, and thought processes Nose - normal and patent, no erythema, discharge or polyps Mouth - mucous membranes moist, pharynx normal without lesions Neck - supple, no significant adenopathy Chest - clear to auscultation, no wheezes, rales or rhonchi, symmetric air entry Heart - normal rate, regular rhythm, normal S1, S2, no murmurs, rubs, clicks or gallops Skin -abdominal fold: Tiny break in the skin to the left of the midline.  No drainage expressed at this time.  No surrounding erythema.  No fluctuance appreciated. Red whelps noted on the upper arms especially the left upper arm  Results for orders placed or performed in visit on 04/30/18  POCT glucose (manual entry)  Result Value Ref Range    POC Glucose 151 (A) 70 - 99 mg/dl   Lab Results  Component Value Date   HGBA1C 7.7 (H) 02/26/2018    ASSESSMENT AND PLAN:  1. Hives Questionable etiology.  I have asked patient to pay attention to his foods and to see whether hives develop when he eats certain foods. Will refer to allergy/immunology. We will give a short course of prednisone to use as needed.  Patient advised that it can cause some elevation in blood sugar.  Continue Benadryl.  2. Dermatitis The spot on his right big toe looks like contact dermatitis.  We will give triamcinolone cream to use as needed.  3. Controlled type 2 diabetes mellitus with complication, with long-term current use of insulin ([place]) This was not addressed today.  Refill given on Januvia - POCT glucose (manual entry) - Microalbumin / creatinine urine ratio - sitaGLIPtin (JANUVIA) 100 MG tablet; Take 1 tablet (100 mg total) by mouth daily.  Dispense: 90 tablet; Refill: 3  4.  Abdominal incision Slight break in the skin of incision site.  Avoid rubbing the area hard when he takes baths.  Use antibiotic ointment/cream on the area twice a day until it heals.  Follow-up if no improvement or any worsening.    Patient was given the opportunity to ask questions.  Patient verbalized understanding of the plan and was able to repeat key elements of the plan.   Orders Placed This Encounter  Procedures  . Microalbumin / creatinine urine ratio  . POCT glucose (manual entry)     Requested Prescriptions   Pending Prescriptions Disp Refills  . sitaGLIPtin (JANUVIA) 100 MG tablet 90 tablet 3    Sig: Take 1 tablet (100 mg total) by mouth daily.  . cyclobenzaprine (FLEXERIL) 5 MG tablet 60 tablet 0    Sig: Take 1 tablet (5 mg total) by mouth 3 (three) times daily as needed. for muscle spams  . pantoprazole (PROTONIX) 40 MG tablet 90 tablet 0    Sig: Take 1 tablet (40 mg total) by mouth daily.    No follow-ups on file.  Karle Plumber, MD, FACP

## 2018-04-30 NOTE — Progress Notes (Signed)
Pt states he has been breaking out in hives for a month and half

## 2018-05-01 LAB — MICROALBUMIN / CREATININE URINE RATIO
Creatinine, Urine: 139.9 mg/dL
Microalb/Creat Ratio: 8.1 mg/g creat (ref 0.0–30.0)
Microalbumin, Urine: 11.3 ug/mL

## 2018-05-03 ENCOUNTER — Telehealth: Payer: Self-pay | Admitting: Internal Medicine

## 2018-05-03 DIAGNOSIS — L509 Urticaria, unspecified: Secondary | ICD-10-CM | POA: Insufficient documentation

## 2018-05-03 MED ORDER — MUPIROCIN 2 % EX OINT: TOPICAL_OINTMENT | CUTANEOUS | 0 refills | Status: DC

## 2018-05-03 NOTE — Telephone Encounter (Signed)
Pharmacy called because the insurance will only cover the ointment for mupirocin cream. Please follow up pharmacy.

## 2018-05-14 ENCOUNTER — Telehealth: Payer: Self-pay | Admitting: Internal Medicine

## 2018-05-14 NOTE — Telephone Encounter (Signed)
Patient called to get an update on his referral because he states he is breaking out in hives.   Patient was given the allergy clinic phone number to FU on referral. [phone]

## 2018-05-20 ENCOUNTER — Ambulatory Visit (INDEPENDENT_AMBULATORY_CARE_PROVIDER_SITE_OTHER): Payer: Medicaid Other

## 2018-05-20 ENCOUNTER — Telehealth: Payer: Self-pay | Admitting: Cardiology

## 2018-05-20 DIAGNOSIS — I5022 Chronic systolic (congestive) heart failure: Secondary | ICD-10-CM | POA: Diagnosis not present

## 2018-05-20 DIAGNOSIS — Z95810 Presence of automatic (implantable) cardiac defibrillator: Secondary | ICD-10-CM | POA: Diagnosis not present

## 2018-05-20 NOTE — Telephone Encounter (Signed)
Spoke with pt and reminded pt of remote transmission that is due today. Pt verbalized understanding.   

## 2018-05-24 NOTE — Progress Notes (Signed)
EPIC Encounter for ICM Monitoring  Patient Name: [name] is a 52 y.o. male Date: 05/24/2018 Primary Care Physican: [name], MD Primary Cardiologist:[place] Electrophysiologist: Allred Weight256lbs(04/19/2018 office weight)                                                 Transmission reviewed.    Thoracic impedance normal.  Prescribed dosage: Furosemide 80 mg 1 tablet twice a day. [place] Con20 mEq 1 tablet daily.  Labs: 02/26/2018 Creatinine 1.99, BUN 23, Potassium 4.3, Sodium 141, eGFR 37-42 11/16/2017 Creatinine 1.78, BUN 21, Potassium 4.2, Sodium 138, EGFR 43-49 09/21/2017 Creatinine1.56, BUN20, Potassium3.6, Sodium139, [id number] 09/19/2017 Creatinine1.36, BUN16, Potassium4.3, Sodium138, EGFR58->60  09/18/2017 Creatinine1.46, EGFR54->60  09/17/2017 Creatinine1.55, BUN20, Potassium3.6, [id number], [id number]  Recommendations:. None  Follow-up plan: ICM clinic phone appointment on 06/28/2018.       Copy of ICM check sent to Dr. Rayann Heman.   3 month ICM trend: 05/22/2018    1 Year ICM trend:       [name], RN 05/24/2018 5:19 PM

## 2018-05-25 ENCOUNTER — Other Ambulatory Visit: Payer: Self-pay | Admitting: Internal Medicine

## 2018-05-25 DIAGNOSIS — E118 Type 2 diabetes mellitus with unspecified complications: Secondary | ICD-10-CM

## 2018-05-25 DIAGNOSIS — Z794 Long term (current) use of insulin: Principal | ICD-10-CM

## 2018-06-08 ENCOUNTER — Other Ambulatory Visit: Payer: Self-pay | Admitting: Internal Medicine

## 2018-06-16 ENCOUNTER — Ambulatory Visit: Payer: Self-pay | Admitting: Allergy

## 2018-06-20 LAB — CUP PACEART REMOTE DEVICE CHECK
Battery Remaining Percentage: 65 %
Date Time Interrogation Session: 20191112065016
HIGH POWER IMPEDANCE MEASURED VALUE: 63 Ohm
HighPow Impedance: 65 Ohm
Lead Channel Impedance Value: 350 Ohm
Lead Channel Sensing Intrinsic Amplitude: 11.8 mV
Lead Channel Setting Pacing Amplitude: 2.5 V
Lead Channel Setting Pacing Pulse Width: 0.5 ms
MDC IDC LEAD IMPLANT DT: 20150717
MDC IDC LEAD LOCATION: 753860
MDC IDC MSMT BATTERY REMAINING LONGEVITY: 68 mo
MDC IDC MSMT BATTERY VOLTAGE: 2.96 V
MDC IDC MSMT LEADCHNL RV PACING THRESHOLD AMPLITUDE: 0.75 V
MDC IDC MSMT LEADCHNL RV PACING THRESHOLD PULSEWIDTH: 0.5 ms
MDC IDC PG IMPLANT DT: 20150717
MDC IDC PG SERIAL: 7196009
MDC IDC SET LEADCHNL RV SENSING SENSITIVITY: 0.5 mV
MDC IDC STAT BRADY RV PERCENT PACED: 1 %

## 2018-06-28 ENCOUNTER — Encounter: Payer: Self-pay | Admitting: Allergy

## 2018-06-28 ENCOUNTER — Ambulatory Visit (INDEPENDENT_AMBULATORY_CARE_PROVIDER_SITE_OTHER): Payer: Medicaid Other | Admitting: Allergy

## 2018-06-28 ENCOUNTER — Ambulatory Visit (INDEPENDENT_AMBULATORY_CARE_PROVIDER_SITE_OTHER): Payer: Medicaid Other

## 2018-06-28 VITALS — BP 104/82 | HR 75 | Temp 97.9°F | Resp 14 | Ht 72.0 in | Wt 252.0 lb

## 2018-06-28 DIAGNOSIS — T63481A Toxic effect of venom of other arthropod, accidental (unintentional), initial encounter: Secondary | ICD-10-CM | POA: Insufficient documentation

## 2018-06-28 DIAGNOSIS — T782XXA Anaphylactic shock, unspecified, initial encounter: Secondary | ICD-10-CM

## 2018-06-28 DIAGNOSIS — T782XXD Anaphylactic shock, unspecified, subsequent encounter: Secondary | ICD-10-CM

## 2018-06-28 DIAGNOSIS — T63481D Toxic effect of venom of other arthropod, accidental (unintentional), subsequent encounter: Secondary | ICD-10-CM | POA: Diagnosis not present

## 2018-06-28 DIAGNOSIS — I5022 Chronic systolic (congestive) heart failure: Secondary | ICD-10-CM | POA: Diagnosis not present

## 2018-06-28 DIAGNOSIS — Z95810 Presence of automatic (implantable) cardiac defibrillator: Secondary | ICD-10-CM

## 2018-06-28 DIAGNOSIS — L509 Urticaria, unspecified: Secondary | ICD-10-CM | POA: Diagnosis not present

## 2018-06-28 DIAGNOSIS — J3089 Other allergic rhinitis: Secondary | ICD-10-CM

## 2018-06-28 MED ORDER — FAMOTIDINE 20 MG PO TABS: 20.0000 mg | ORAL_TABLET | Freq: Two times a day (BID) | ORAL | 5 refills | Status: DC

## 2018-06-28 MED ORDER — EPINEPHRINE 0.3 MG/0.3ML IJ SOAJ: 0.3000 mg | Freq: Once | INTRAMUSCULAR | 0 refills | Status: AC

## 2018-06-28 MED ORDER — CETIRIZINE HCL 10 MG PO TABS: 10.0000 mg | ORAL_TABLET | Freq: Two times a day (BID) | ORAL | 5 refills | Status: DC

## 2018-06-28 NOTE — Assessment & Plan Note (Signed)
Rhinoconjunctivitis symptoms for the past 2 years mainly during the summer. Does not take any medications for this and no previous work up.  Unable to skin test today due to recent antihistamine intake.  Get bloodwork and will make additional recommendations based on results.

## 2018-06-28 NOTE — Patient Instructions (Addendum)
Get bloodwork - CBC diff, CMP, ESR, CRP, Thyroid cascade, ANA w/reflex, C3, C4, alpha gal, SPEP and UPEP, venom panel, IgE with zone 2  Start zyrtec 11m twice a day and increase to 283mtwice a day as long as it does not cause drowsiness. Start Pepcid 2032mwice a day. Take pictures when it flares.  Start skin care measures as below.  Avoid bee stings. I have prescribed epinephrine injectable and demonstrated proper use. For mild symptoms you can take over the counter antihistamines such as Benadryl and monitor symptoms closely. If symptoms worsen or if you have severe symptoms including breathing issues, throat closure, significant swelling, whole body hives, severe diarrhea and vomiting, lightheadedness then inject epinephrine and seek immediate medical care afterwards.  Follow up in 1 month

## 2018-06-28 NOTE — Assessment & Plan Note (Signed)
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 Most recently got stung a few years ago which required epinephrine use. Currently does not have epinephrine on hand.  Get bloodwork. Avoid insect stings.  I have prescribed epinephrine injectable and demonstrated proper use. For mild symptoms you can take over the counter antihistamines such as Benadryl and monitor symptoms closely. If symptoms worsen or if you have severe symptoms including breathing issues, throat closure, significant swelling, whole body hives, severe diarrhea and vomiting, lightheadedness then inject epinephrine and seek immediate medical care afterwards.  Action plan given.

## 2018-06-28 NOTE — Assessment & Plan Note (Signed)
Rash/hives for 3-4 years but worse the last 7 months now having flat rash lasting up to 1 day. Describes them as pruritic and occurring on a daily basis. No triggers identified. Tried benadryl, zyrtec and Claritin with no benefit. Used oral steroids with minimal benefit.  Unable to skin test today due to recent antihistamine intake.  Get bloodwork and will make additional recommendations based on results.  Start zyrtec 10mg  twice a day and increase to 20mg  twice a day as long as it does not cause drowsiness.  Start Pepcid 20mg  twice a day.  Take pictures when it flares.  Start skin care measures as below. . Avoid the following potential triggers: alcohol, tight clothing, NSAIDs.

## 2018-06-28 NOTE — Progress Notes (Signed)
New Patient Note  RE: [name] MRN: [id number] DOB: 28-Apr-1966 Date of Office Visit: 06/28/2018  Referring provider: [name], MD Primary care provider: [name], MD  Chief Complaint: Urticaria (started 74moago, off and on)  History of Present Illness: I had the [facility] of [name] He is a 53y.o. male, who is referred here by JLadell Pier MD for the evaluation of hives.   Rash/hives started about 3-4 years ago but worse the past 7 months. This can occur anywhere on his body. Usually they would resolve after 30 minutes but now lasting a few hours. Describes them as pruritic, red, raised. Individual rashes lasts about up to 1 day. No ecchymosis upon resolution. Associated symptoms include: none. Suspected triggers are unknown. Denies any fevers, chills, changes in medications, foods, personal care products or recent infections. He has tried the following therapies: benadryl, zyrtec, Claritin with no benefit. Systemic steroids yes with no benefit. This occurs on a daily basis. Previous work up includes: no recent bloodwork. Previous history of rash/hives: yes. Patient is up to date with the following cancer screening tests: none. Patient consumes red meat a few times a week and unsure of any recent tick bites.   Assessment and Plan: BChasenis a 53y.o. male with: Urticaria Rash/hives for 3-4 years but worse the last 7 months now having flat rash lasting up to 1 day. Describes them as pruritic and occurring on a daily basis. No triggers identified. Tried benadryl, zyrtec and Claritin with no benefit. Used oral steroids with minimal benefit.  Unable to skin test today due to recent antihistamine intake.  Get bloodwork and will make additional recommendations based on results.  Start zyrtec 133mtwice a day and increase to 2079mwice a day as long as it does not cause  drowsiness.  Start Pepcid 25m12mice a day.  Take pictures when it flares.  Start skin care measures as below. . Avoid the following potential triggers: alcohol, tight clothing, NSAIDs.   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 Most recently got stung a few years ago which required epinephrine use. Currently does not have epinephrine on hand.  Get bloodwork. Avoid insect stings.  I have prescribed epinephrine injectable and demonstrated proper use. For mild symptoms you can take over the counter antihistamines such as Benadryl and monitor symptoms closely. If symptoms worsen or if you have severe symptoms including breathing issues, throat closure, significant swelling, whole body hives, severe diarrhea and vomiting, lightheadedness then inject epinephrine and seek immediate medical care afterwards.  Action plan given.  Other allergic rhinitis Rhinoconjunctivitis symptoms for the past 2 years mainly during the summer. Does not take any medications for this and no previous work up.  Unable to skin test today due to recent antihistamine intake.  Get bloodwork and will make additional recommendations based on results.   Return in about 4 weeks (around 07/26/2018).  Meds ordered this encounter  Medications  . famotidine (PEPCID) 20 MG tablet    Sig: Take 1 tablet (20 mg total) by mouth 2 (two) times daily.    Dispense:  60 tablet    Refill:  5  . cetirizine (ZYRTEC) 10 MG tablet    Sig: Take 1 tablet (10 mg total) by mouth 2 (two) times daily.  Dispense:  60 tablet    Refill:  5  . EPINEPHrine 0.3 mg/0.3 mL IJ SOAJ injection    Sig: Inject 0.3 mLs (0.3 mg total) into the muscle once for 1 dose.    Dispense:  2 Device    Refill:  0    Lab Orders     CBC with Differential     Comprehensive metabolic panel     Sed Rate (ESR)     C-reactive protein     Thyroid  Cascade Profile     ANA Direct w/Reflex if Positive     C3 and C4     Alpha-Gal Panel     Protein Electrophoresis, (serum)     Protein Electrophoresis, Urine Rflx.     Allergen Hymenoptera Panel     Allergens Zone 2     Tryptase  Other allergy screening: Asthma: no Rhino conjunctivitis: yes  He reports symptoms of sneezing, coughing, itchy eyes/nose. Symptoms have been going on for 2 years. The symptoms are present during the summer. Previous work up includes: none. Food allergy: no Medication allergy: no Hymenoptera allergy: yes  Patient has anaphylactic reaction with throat closure and swelling.   Patient was on allergy immunotherapy as a child. Eczema:yes History of recurrent infections suggestive of immunodeficency: no  Diagnostics: Skin Testing: Deferred due to recent antihistamines use.  Past Medical History: Patient Active Problem List   Diagnosis Date Noted  . 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 Other allergic rhinitis 06/28/2018  . Urticaria 05/03/2018  . S/P inguinal hernia repair 03/01/2018  . Cellulitis 09/18/2017  . Abscess 09/18/2017  . Abscess of groin   . Unilateral inguinal hernia without obstruction or gangrene 07/24/2017  . 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  . Hypersomnia 02/28/2016  . Abnormal PFT 02/24/2016  . Diabetic polyneuropathy associated with type 2 diabetes mellitus ([place]) 12/18/2015  . Lumbar back pain 12/18/2015  . Controlled type 2 diabetes mellitus with complication, with long-term current use of insulin ([place]) 09/17/2015  . 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 Lesion of right median nerve at forearm 04/16/2015  . ICD (implantable cardioverter-defibrillator) in place 03/28/2014  . Chronic systolic congestive heart failure ([place]) 03/28/2014  . Ventricular tachycardia, non-sustained ([place]) 03/27/2014  . Nonischemic cardiomyopathy ([place]) 11/09/2013  . Chronic systolic dysfunction of left ventricle  11/09/2013  . Syncope 10/20/2013  . Chronic chest pain 07/28/2013  . 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 RENAL CALCULUS 05/13/2009  . Obesity 05/08/2009  . Former heavy cigarette smoker (20-39 per day) 05/08/2009  . HOARSENESS 05/08/2009  . ANXIETY 02/28/2008  . GERD 02/28/2008  . IRRITABLE BOWEL SYNDROME 02/28/2008   Past Medical History:  Diagnosis Date  . AICD (automatic cardioverter/defibrillator) present   . Chronic bronchitis ([place])    "get it most q yr" (12/23/2013)  . Chronic systolic (congestive) heart failure ([place])   . Fracture of left humerus    a. 07/2013.  Marland Kitchen GERD (gastroesophageal reflux disease)    not at present time  . Heart murmur    "born w/it"   . History of renal calculi   . Hypertension   . Kidney stones   . NICM (nonischemic cardiomyopathy) ([place])    a. 07/2013 Echo: EF 20-25%, diff HK, Gr2 DD, mild MR, mod dil LA/RA. EF 40% 2017 echo  . Obesity   . 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 Shockable heart rhythm detected by automated external  defibrillator   . Syncope   . Type II diabetes mellitus ([place]) 2006   Past Surgical History: Past Surgical History:  Procedure Laterality Date  . CARDIAC CATHETERIZATION  08/10/13  . CHOLECYSTECTOMY  ~ 2010  . IMPLANTABLE CARDIOVERTER DEFIBRILLATOR IMPLANT  12/23/2013   STJ Fortify ICD implanted by [name] for cardiomyopathy and syncope  . IMPLANTABLE CARDIOVERTER DEFIBRILLATOR IMPLANT N/A 12/23/2013   Procedure: IMPLANTABLE CARDIOVERTER DEFIBRILLATOR IMPLANT;  Surgeon: Coralyn Mark, MD;  Location: [facility]ATH [facility];  Service: Cardiovascular;  Laterality: N/A;  . INGUINAL HERNIA REPAIR Left 03/01/2018   Procedure: OPEN REPAIR OF LEFT INGUINAL HERNIA WITH MESH;  Surgeon: Greer Pickerel, MD;  Location: [facility];  Service: General;  Laterality: Left;  . LEFT HEART CATHETERIZATION WITH CORONARY ANGIOGRAM N/A 08/10/2013   Procedure: LEFT HEART CATHETERIZATION WITH  CORONARY ANGIOGRAM;  Surgeon: Jettie Booze, MD;  Location: [facility] CATH LAB;  Service: Cardiovascular;  Laterality: N/A;  . ORIF HUMERUS FRACTURE Left 08/12/2013   Procedure: OPEN REDUCTION INTERNAL FIXATION (ORIF) HUMERAL SHAFT FRACTURE;  Surgeon: Newt Minion, MD;  Location: [facility];  Service: Orthopedics;  Laterality: Left;  Open Reduction Internal Fixation Left Humerus  . URETEROSCOPY     "laser for kidney stones"   Medication List:  Current Outpatient Medications  Medication Sig Dispense Refill  . ACCU-CHEK AVIVA PLUS test strip USE ONE STRIP THREE TIMES DAILY 100 each 12  . aspirin 81 MG tablet Take 81 mg by mouth daily.    . Blood Glucose Monitoring Suppl (ACCU-CHEK AVIVA PLUS) W/DEVICE KIT 1 Device by Does not apply route 3 (three) times daily after meals. 1 kit 0  . carvedilol (COREG) 25 MG tablet Take 1 tablet (25 mg total) by mouth 2 (two) times daily. 180 tablet 3  . cyclobenzaprine (FLEXERIL) 5 MG tablet TAKE 1 TABLET BY MOUTH THREE TIMES DAILY AS NEEDED FOR MUSCLE SPASM **MUST  HAVE  OFFICE  VISIT  FOR  REFILLS** 60 tablet 0  . diphenhydrAMINE (BENADRYL) 50 MG capsule Take 50 mg by mouth at bedtime.    . furosemide (LASIX) 80 MG tablet Take 1 tablet (80 mg total) by mouth 2 (two) times daily. 180 tablet 3  . gabapentin (NEURONTIN) 300 MG capsule Take 1 capsule (300 mg total) by mouth 3 (three) times daily. 90 capsule 6  . metFORMIN (GLUCOPHAGE) 1000 MG tablet TAKE 1 TABLET BY MOUTH IN THE MORNING AND 1 & 1/2 (ONE & ONE-HALF) IN THE EVENING 75 tablet 1  . mupirocin ointment (BACTROBAN) 2 % Apply to affected area on abdomen twice daily 22 g 0  . pantoprazole (PROTONIX) 40 MG tablet Take 1 tablet (40 mg total) by mouth daily. 90 tablet 0  . potassium chloride SA (KLOR-CON M20) 20 MEQ tablet Take 1 tablet ( 20 meq) by mouth daily or as directed. (Patient taking differently: Take 20 mEq by mouth daily. ) 90 tablet 3  . predniSONE (DELTASONE) 20 MG tablet 1 tab daily x 3 days 3 tablet 3    . sacubitril-valsartan (ENTRESTO) 24-26 MG Take 1 tablet by mouth 2 (two) times daily. 180 tablet 3  . sitaGLIPtin (JANUVIA) 100 MG tablet Take 1 tablet (100 mg total) by mouth daily. 90 tablet 3  . tamsulosin (FLOMAX) 0.4 MG CAPS capsule Take 2 capsules (0.8 mg total) by mouth daily. (Patient taking differently: Take 0.4 mg by mouth 2 (two) times daily. ) 60 capsule 5  . triamcinolone cream (KENALOG) 0.1 % Apply to affected area on toe  twice a day 30 g 0  . atorvastatin (LIPITOR) 10 MG tablet Take 1 tablet (10 mg total) by mouth daily. (Patient not taking: Reported on 06/28/2018) 30 tablet 11  . cetirizine (ZYRTEC) 10 MG tablet Take 1 tablet (10 mg total) by mouth 2 (two) times daily. 60 tablet 5  . EPINEPHrine 0.3 mg/0.3 mL IJ SOAJ injection Inject 0.3 mLs (0.3 mg total) into the muscle once for 1 dose. 2 Device 0  . famotidine (PEPCID) 20 MG tablet Take 1 tablet (20 mg total) by mouth 2 (two) times daily. 60 tablet 5  . fluticasone (FLONASE) 50 MCG/ACT nasal spray Place 1 spray into both nostrils daily. (Patient not taking: Reported on 06/28/2018) 16 g 0  . Lancet Devices (ACCU-CHEK SOFTCLIX) lancets 1 each by Other route 3 (three) times daily. (Patient not taking: Reported on 06/28/2018) 1 each 0   No current facility-administered medications for this visit.    Allergies: Allergies  Allergen Reactions  . Bee Venom Anaphylaxis   Social History: Social History   Socioeconomic History  . Marital status: Single    Spouse name: Not on file  . Number of children: Not on file  . Years of education: Not on file  . Highest education level: Not on file  Occupational History  . Occupation: Disabled  Social Needs  . Financial resource strain: Not on file  . Food insecurity:    Worry: Not on file    Inability: Not on file  . Transportation needs:    Medical: Not on file    Non-medical: Not on file  Tobacco Use  . Smoking status: Former Smoker    Packs/day: 1.00    Years: 28.00    Pack  years: 28.00    Types: Cigarettes    Last attempt to quit: 06/03/2014    Years since quitting: 4.0  . Smokeless tobacco: Never Used  Substance and Sexual Activity  . Alcohol use: Yes    Alcohol/week: 0.0 standard drinks    Comment: 12/23/2013 "might drink a couple times/yr"  . Drug use: Yes    Types: Marijuana    Comment: 02/25/18- patient  states has not used marjuana  . Sexual activity: Yes  Lifestyle  . Physical activity:    Days per week: Not on file    Minutes per session: Not on file  . Stress: Not on file  Relationships  . Social connections:    Talks on phone: Not on file    Gets together: Not on file    Attends religious service: Not on file    Active member of club or organization: Not on file    Attends meetings of clubs or organizations: Not on file    Relationship status: Not on file  Other Topics Concern  . Not on file  Social History Narrative   Pt lives with his brother    Lives in a [address] year old home. Smoking: 1982 to 2016 - 1ppd Occupation: not employed  Environmental HistoryFreight forwarder in the house: no Charity fundraiser in the family room: no Carpet in the bedroom: yes Heating: gas Cooling: central Pet: no  Family History: Family History  Problem Relation Age of Onset  . Diabetes Father   . Arthritis Father   . Hypertension Father   . Diabetes Mother   . Arthritis Mother   . Hypertension Mother   . Hypertension Brother    Problem  Relation Asthma                                   No  Eczema                                Sister  Food allergy                          No  Allergic rhino conjunctivitis     No  Urticaria    Mother   Review of Systems  Constitutional: Negative for appetite change, chills, fever and unexpected weight change.  HENT: Negative for congestion and rhinorrhea.   Eyes: Negative for itching.  Respiratory: Negative for cough, chest tightness, shortness of breath and wheezing.     Cardiovascular: Positive for chest pain.  Gastrointestinal: Negative for abdominal pain.  Genitourinary: Negative for difficulty urinating.  Skin: Positive for rash.  Allergic/Immunologic: Negative for environmental allergies and food allergies.  Neurological: Negative for headaches.   Objective: BP 104/82 (BP Location: Right Arm, Patient Position: Sitting, Cuff Size: Normal)   Pulse 75   Temp 97.9 F (36.6 C) (Oral)   Resp 14   Ht 6' (1.829 m)   Wt 252 lb (114.3 kg)   SpO2 96%   BMI 34.18 kg/m  Body mass index is 34.18 kg/m. Physical Exam  Constitutional: He is oriented to person, place, and time. He appears well-developed and well-nourished.  HENT:  Head: Normocephalic and atraumatic.  Right Ear: External ear normal.  Left Ear: External ear normal.  Nose: Nose normal.  Mouth/Throat: Oropharynx is clear and moist.  Eyes: Conjunctivae and EOM are normal.  Neck: Neck supple.  Cardiovascular: Normal rate, regular rhythm and normal heart sounds. Exam reveals no gallop and no friction rub.  No murmur heard. Pulmonary/Chest: Effort normal and breath sounds normal. He has no wheezes. He has no rales.  Abdominal: Soft.  Lymphadenopathy:    He has no cervical adenopathy.  Neurological: He is alert and oriented to person, place, and time.  Skin: Skin is warm and dry. Rash noted.  Mild flat erythematous patch on upper extremities b/l.  Psychiatric: He has a normal mood and affect. His behavior is normal.  Nursing note and vitals reviewed.  The plan was reviewed with the patient/family, and all questions/concerned were addressed.  It was my pleasure to see Jenesis today and participate in his care. Please feel free to contact me with any questions or concerns.  Sincerely,  Rexene Alberts, DO Allergy & Immunology  [facility] office: [phone] [facility] office: [phone]

## 2018-06-29 NOTE — Progress Notes (Signed)
EPIC Encounter for ICM Monitoring  Patient Name: [name] is a 53 y.o. male Date: 06/29/2018 Primary Care Physican: [name], MD Primary Cardiologist:[place] Electrophysiologist: Allred [phone]  Heart failure questions reviewed.  He said he has occasional shortness of breath.   Thoracic impedance just slightly below baseline normal.  Prescribed dosage:Furosemide 80 mg 1 tablet twice a day. [place] Con20 mEq 1 tablet daily.  Labs: 02/26/2018 Creatinine 1.99, BUN 23, Potassium 4.3, Sodium 141, eGFR 37-42 11/16/2017 Creatinine 1.78, BUN 21, Potassium 4.2, Sodium 138, EGFR 43-49 09/21/2017 Creatinine1.56, BUN20, Potassium3.6, Sodium139, [id number] 09/19/2017 Creatinine1.36, BUN16, Potassium4.3, Sodium138, EGFR58->60  09/18/2017 Creatinine1.46, EGFR54->60  09/17/2017 Creatinine1.55, BUN20, Potassium3.6, [id number], [id number]  Recommendations:. No changes.  Encouraged to call for fluid symptoms.  Follow-up plan: ICM clinic phone appointment on2/24/2020.   Copy of ICM check sent to Dr.Allred.   3 month ICM trend: 06/21/2018    1 Year ICM trend:       [name], RN 06/29/2018 3:52 PM

## 2018-07-01 ENCOUNTER — Ambulatory Visit: Payer: Medicaid Other | Attending: Internal Medicine | Admitting: Internal Medicine

## 2018-07-01 ENCOUNTER — Encounter: Payer: Self-pay | Admitting: Internal Medicine

## 2018-07-01 VITALS — BP 107/76 | HR 72 | Temp 97.6°F | Resp 16 | Ht 72.0 in | Wt 257.8 lb

## 2018-07-01 DIAGNOSIS — IMO0002 Reserved for concepts with insufficient information to code with codable children: Secondary | ICD-10-CM

## 2018-07-01 DIAGNOSIS — Z79899 Other long term (current) drug therapy: Secondary | ICD-10-CM | POA: Diagnosis not present

## 2018-07-01 DIAGNOSIS — G8929 Other chronic pain: Secondary | ICD-10-CM | POA: Insufficient documentation

## 2018-07-01 DIAGNOSIS — Z95810 Presence of automatic (implantable) cardiac defibrillator: Secondary | ICD-10-CM | POA: Insufficient documentation

## 2018-07-01 DIAGNOSIS — E1142 Type 2 diabetes mellitus with diabetic polyneuropathy: Secondary | ICD-10-CM | POA: Diagnosis not present

## 2018-07-01 DIAGNOSIS — E1122 Type 2 diabetes mellitus with diabetic chronic kidney disease: Secondary | ICD-10-CM | POA: Diagnosis not present

## 2018-07-01 DIAGNOSIS — E1165 Type 2 diabetes mellitus with hyperglycemia: Secondary | ICD-10-CM

## 2018-07-01 DIAGNOSIS — K219 Gastro-esophageal reflux disease without esophagitis: Secondary | ICD-10-CM

## 2018-07-01 DIAGNOSIS — N183 Chronic kidney disease, stage 3 unspecified: Secondary | ICD-10-CM

## 2018-07-01 DIAGNOSIS — Z87442 Personal history of urinary calculi: Secondary | ICD-10-CM | POA: Insufficient documentation

## 2018-07-01 DIAGNOSIS — E118 Type 2 diabetes mellitus with unspecified complications: Secondary | ICD-10-CM

## 2018-07-01 DIAGNOSIS — J069 Acute upper respiratory infection, unspecified: Secondary | ICD-10-CM

## 2018-07-01 DIAGNOSIS — I472 Ventricular tachycardia: Secondary | ICD-10-CM

## 2018-07-01 DIAGNOSIS — Z87891 Personal history of nicotine dependence: Secondary | ICD-10-CM | POA: Diagnosis not present

## 2018-07-01 DIAGNOSIS — L509 Urticaria, unspecified: Secondary | ICD-10-CM | POA: Diagnosis not present

## 2018-07-01 DIAGNOSIS — I4729 Other ventricular tachycardia: Secondary | ICD-10-CM

## 2018-07-01 DIAGNOSIS — Z7982 Long term (current) use of aspirin: Secondary | ICD-10-CM | POA: Insufficient documentation

## 2018-07-01 DIAGNOSIS — I429 Cardiomyopathy, unspecified: Secondary | ICD-10-CM | POA: Insufficient documentation

## 2018-07-01 DIAGNOSIS — Z833 Family history of diabetes mellitus: Secondary | ICD-10-CM | POA: Insufficient documentation

## 2018-07-01 DIAGNOSIS — Z6834 Body mass index (BMI) 34.0-34.9, adult: Secondary | ICD-10-CM | POA: Insufficient documentation

## 2018-07-01 DIAGNOSIS — M47819 Spondylosis without myelopathy or radiculopathy, site unspecified: Secondary | ICD-10-CM | POA: Insufficient documentation

## 2018-07-01 DIAGNOSIS — M4800 Spinal stenosis, site unspecified: Secondary | ICD-10-CM | POA: Insufficient documentation

## 2018-07-01 DIAGNOSIS — N400 Enlarged prostate without lower urinary tract symptoms: Secondary | ICD-10-CM | POA: Insufficient documentation

## 2018-07-01 DIAGNOSIS — E669 Obesity, unspecified: Secondary | ICD-10-CM | POA: Insufficient documentation

## 2018-07-01 DIAGNOSIS — I5022 Chronic systolic (congestive) heart failure: Secondary | ICD-10-CM

## 2018-07-01 DIAGNOSIS — E785 Hyperlipidemia, unspecified: Secondary | ICD-10-CM | POA: Diagnosis not present

## 2018-07-01 DIAGNOSIS — N529 Male erectile dysfunction, unspecified: Secondary | ICD-10-CM | POA: Diagnosis not present

## 2018-07-01 DIAGNOSIS — Z794 Long term (current) use of insulin: Secondary | ICD-10-CM | POA: Diagnosis not present

## 2018-07-01 DIAGNOSIS — Z2821 Immunization not carried out because of patient refusal: Secondary | ICD-10-CM

## 2018-07-01 DIAGNOSIS — Z8249 Family history of ischemic heart disease and other diseases of the circulatory system: Secondary | ICD-10-CM | POA: Insufficient documentation

## 2018-07-01 LAB — CBC WITH DIFFERENTIAL/PLATELET
BASOS ABS: 0 10*3/uL (ref 0.0–0.2)
Basos: 1 %
EOS (ABSOLUTE): 0.3 10*3/uL (ref 0.0–0.4)
EOS: 6 %
HEMATOCRIT: 36.9 % — AB (ref 37.5–51.0)
HEMOGLOBIN: 12.5 g/dL — AB (ref 13.0–17.7)
Immature Grans (Abs): 0 10*3/uL (ref 0.0–0.1)
Immature Granulocytes: 1 %
LYMPHS ABS: 1.7 10*3/uL (ref 0.7–3.1)
LYMPHS: 39 %
MCH: 30 pg (ref 26.6–33.0)
MCHC: 33.9 g/dL (ref 31.5–35.7)
MCV: 89 fL (ref 79–97)
MONOCYTES: 8 %
Monocytes Absolute: 0.4 10*3/uL (ref 0.1–0.9)
NEUTROS ABS: 2.1 10*3/uL (ref 1.4–7.0)
Neutrophils: 45 %
Platelets: 301 10*3/uL (ref 150–450)
RBC: 4.17 x10E6/uL (ref 4.14–5.80)
RDW: 13.4 % (ref 11.6–15.4)
WBC: 4.5 10*3/uL (ref 3.4–10.8)

## 2018-07-01 LAB — ALLERGEN HYMENOPTERA PANEL
Honeybee IgE: <0.1 kU/L
Hornet, White Face, IgE: <0.1 kU/L
Hornet, Yellow, IgE: <0.1 kU/L
Paper Wasp IgE: 0.12 kU/L — AB
Yellow Jacket, IgE: <0.1 kU/L

## 2018-07-01 LAB — IGE+ALLERGENS ZONE 2(30)
Alternaria Alternata IgE: <0.1 kU/L
Amer Sycamore IgE Qn: <0.1 kU/L
Aspergillus Fumigatus IgE: 0.16 kU/L — AB
Bahia Grass IgE: 0.53 kU/L — AB
Bermuda Grass IgE: 0.17 kU/L — AB
Cat Dander IgE: 0.52 kU/L — AB
Cedar, Mountain IgE: <0.1 kU/L
Cladosporium Herbarum IgE: 0.12 kU/L — AB
Common Silver Birch IgE: <0.1 kU/L
D Farinae IgE: 3.62 kU/L — AB
D001-IGE D PTERONYSSINUS: 3.17 kU/L — AB
Dog Dander IgE: 7.26 kU/L — AB
I206-IGE COCKROACH, AMERICAN: 0.19 kU/L — AB
IGE (IMMUNOGLOBULIN E), SERUM: 601 [IU]/mL — AB (ref 6–495)
Johnson Grass IgE: 0.43 kU/L — AB
M001-IGE PENICILLIUM CHRYSOGEN: 0.14 kU/L — AB
Mucor Racemosus IgE: 0.22 kU/L — AB
Mugwort IgE Qn: <0.1 kU/L
Oak, White IgE: <0.1 kU/L
Pigweed, Rough IgE: <0.1 kU/L
Plantain, English IgE: <0.1 kU/L
Ragweed, Short IgE: <0.1 kU/L
Stemphylium Herbarum IgE: <0.1 kU/L
Sweet gum IgE RAST Ql: <0.1 kU/L
T041-IGE HICKORY, WHITE: 0.14 kU/L — AB
Timothy Grass IgE: 0.99 kU/L — AB
White Mulberry IgE: <0.1 kU/L

## 2018-07-01 LAB — ALPHA-GAL PANEL
Alpha Gal IgE*: <0.1 kU/L (ref <0.10)
BEEF CLASS INTERPRETATION: 0
Class Interpretation: 0
Class Interpretation: 0
Pork (Sus spp) IgE: <0.1 kU/L (ref <0.35)

## 2018-07-01 LAB — COMPREHENSIVE METABOLIC PANEL
ALBUMIN: 4.4 g/dL (ref 3.8–4.9)
ALK PHOS: 50 IU/L (ref 39–117)
ALT: 12 IU/L (ref 0–44)
AST: 14 IU/L (ref 0–40)
Albumin/Globulin Ratio: 1.7 (ref 1.2–2.2)
BILIRUBIN TOTAL: 0.4 mg/dL (ref 0.0–1.2)
BUN / CREAT RATIO: 11 (ref 9–20)
BUN: 18 mg/dL (ref 6–24)
CO2: 24 mmol/L (ref 20–29)
CREATININE: 1.66 mg/dL — AB (ref 0.76–1.27)
Calcium: 9.3 mg/dL (ref 8.7–10.2)
Chloride: 102 mmol/L (ref 96–106)
GFR calc non Af Amer: 46 mL/min/{1.73_m2} — ABNORMAL LOW (ref >59)
GFR, EST AFRICAN AMERICAN: 54 mL/min/{1.73_m2} — AB (ref >59)
GLUCOSE: 168 mg/dL — AB (ref 65–99)
Globulin, Total: 2.6 g/dL (ref 1.5–4.5)
Potassium: 4 mmol/L (ref 3.5–5.2)
SODIUM: 142 mmol/L (ref 134–144)
TOTAL PROTEIN: 7 g/dL (ref 6.0–8.5)

## 2018-07-01 LAB — POCT GLYCOSYLATED HEMOGLOBIN (HGB A1C): HbA1c, POC (controlled diabetic range): 7.5 % — AB (ref 0.0–7.0)

## 2018-07-01 LAB — PROTEIN ELECTROPHORESIS, SERUM
A/G RATIO SPE: 1.3 (ref 0.7–1.7)
ALPHA 2: 0.6 g/dL (ref 0.4–1.0)
Albumin ELP: 3.9 g/dL (ref 2.9–4.4)
Alpha 1: 0.1 g/dL (ref 0.0–0.4)
BETA: 1.1 g/dL (ref 0.7–1.3)
GAMMA GLOBULIN: 1.3 g/dL (ref 0.4–1.8)
Globulin, Total: 3.1 g/dL (ref 2.2–3.9)

## 2018-07-01 LAB — C3 AND C4
COMPLEMENT C3, SERUM: 125 mg/dL (ref 82–167)
Complement C4, Serum: 28 mg/dL (ref 14–44)

## 2018-07-01 LAB — PROTEIN ELECTROPHORESIS, URINE REFLEX
ALPHA-1-GLOBULIN, U: 1.5 %
ALPHA-2-GLOBULIN, U: 12.1 %
Albumin ELP, Urine: 31.8 %
Beta Globulin, U: 32.6 %
GAMMA GLOBULIN, U: 22 %
Protein, Ur: 14.9 mg/dL

## 2018-07-01 LAB — THYROID CASCADE PROFILE: TSH: 0.739 u[IU]/mL (ref 0.450–4.500)

## 2018-07-01 LAB — C-REACTIVE PROTEIN: CRP: 2 mg/L (ref 0–10)

## 2018-07-01 LAB — SEDIMENTATION RATE: Sed Rate: 25 mm/hr (ref 0–30)

## 2018-07-01 LAB — TRYPTASE: TRYPTASE: 10.6 ug/L (ref 2.2–13.2)

## 2018-07-01 LAB — ANA W/REFLEX IF POSITIVE: ANA: NEGATIVE

## 2018-07-01 LAB — GLUCOSE, POCT (MANUAL RESULT ENTRY): POC Glucose: 108 mg/dl — AB (ref 70–99)

## 2018-07-01 MED ORDER — BENZONATATE 100 MG PO CAPS: 100.0000 mg | ORAL_CAPSULE | Freq: Two times a day (BID) | ORAL | 0 refills | Status: DC | PRN

## 2018-07-01 MED ORDER — PANTOPRAZOLE SODIUM 40 MG PO TBEC: 40.0000 mg | DELAYED_RELEASE_TABLET | Freq: Two times a day (BID) | ORAL | 2 refills | Status: DC

## 2018-07-01 MED ORDER — LEVALBUTEROL TARTRATE 45 MCG/ACT IN AERO: 2.0000 | INHALATION_SPRAY | Freq: Four times a day (QID) | RESPIRATORY_TRACT | 1 refills | Status: DC | PRN

## 2018-07-01 MED ORDER — ACCU-CHEK AVIVA PLUS W/DEVICE KIT: 1.0000 | PACK | Freq: Three times a day (TID) | 0 refills | Status: DC

## 2018-07-01 MED ORDER — SILDENAFIL CITRATE 50 MG PO TABS: ORAL_TABLET | ORAL | 1 refills | Status: DC

## 2018-07-01 NOTE — Progress Notes (Signed)
Patient ID: [name], male    DOB: July 29, 1965  MRN: [id number]  CC: Diabetes   Subjective: [name] is a 53 y.o. male who presents for chronic ds management His concerns today include:  Patient with history of NICM, systolic CHF status post ICD placement,DMtype 2, HL, CKD stage 3, obesity, BPH, and chronic lower back pain secondary to spondylosis and spinal stenosis.  Hives:  Saw Dr. Maudie Mercury and had panel of blood work done.  He was changed to Zyrtec and Pepcid added.  It has helped in decreasing the severity of episodes so far  He complains of having a cold.  Symptoms include coughing, sneezing, congestion x 2 days.  Did not check temp.  Wants Z-pack, Tessalon perles and Tamiflu.  Also requests prescription for Xopenex inhaler which he states someone had given him in the past and he uses it only when he has a cold because it helps his shortness of breath and congestion when he gets a cold.  Denies any history of COPD or asthma  GERD:  C/o having bad GERD symptoms x 2-3 wks.  Thinks due to drinking tea every day for several mths.  He is stop doing so.  Symptoms include burning in the upper chest and throat and regurgitation of foods after he eats.  Worse at night when he eats and lay down.  He is not on any NSAIDs.  He avoids foods that causes reflux symptoms.  He is on Protonix 40 mg daily.  He tried taking 2 every morning and states it has not helped.  He was also recently placed on Pepcid by the allergist due to symptoms of hives.  He found that adding Pepcid has not helped.  Since inguinal hernia repair that was done in September of last year, he reports that he still has some numbness around the incision area.  He has also noticed that since that surgery he has had impotence.  Prior to the surgery he had no problems getting and maintaining an erection and used to have spontaneous morning erections.     DM:  Needs new meter as his last one quit working. Walks 3 miles a day.   Feels that he does okay with his eating habits  CHF/NICM/NSVT: Reports compliance with medications including Entresto.  He thinks that his feet are swollen today.  He reports chronic shortness of breath even when he is walking his 3 miles a day.  No chest pains.  Last saw his cardiologist in November.  Had nl ICD function  BPH:  Doing well on Flomax Patient Active Problem List   Diagnosis Date Noted  . 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 Other allergic rhinitis 06/28/2018  . Urticaria 05/03/2018  . S/P inguinal hernia repair 03/01/2018  . Cellulitis 09/18/2017  . Abscess 09/18/2017  . Abscess of groin   . Unilateral inguinal hernia without obstruction or gangrene 07/24/2017  . 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  . Hypersomnia 02/28/2016  . Abnormal PFT 02/24/2016  . Diabetic polyneuropathy associated with type 2 diabetes mellitus ([place]) 12/18/2015  . Lumbar back pain 12/18/2015  . Controlled type 2 diabetes mellitus with complication, with long-term current use of insulin ([place]) 09/17/2015  . 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 Lesion of right median nerve at forearm 04/16/2015  . ICD (implantable cardioverter-defibrillator) in place 03/28/2014  . Chronic systolic congestive heart failure ([place]) 03/28/2014  . Ventricular tachycardia, non-sustained ([place]) 03/27/2014  . Nonischemic cardiomyopathy ([place])  11/09/2013  . Chronic systolic dysfunction of left ventricle 11/09/2013  . Syncope 10/20/2013  . Chronic chest pain 07/28/2013  . 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 RENAL CALCULUS 05/13/2009  . Obesity 05/08/2009  . Former heavy cigarette smoker (20-39 per day) 05/08/2009  . HOARSENESS 05/08/2009  . ANXIETY 02/28/2008  . GERD 02/28/2008  . IRRITABLE BOWEL SYNDROME 02/28/2008     Current Outpatient Medications on File Prior to Visit  Medication Sig Dispense Refill  . ACCU-CHEK AVIVA PLUS test strip USE ONE STRIP THREE  TIMES DAILY 100 each 12  . aspirin 81 MG tablet Take 81 mg by mouth daily.    Marland Kitchen atorvastatin (LIPITOR) 10 MG tablet Take 1 tablet (10 mg total) by mouth daily. (Patient not taking: Reported on 06/28/2018) 30 tablet 11  . Blood Glucose Monitoring Suppl (ACCU-CHEK AVIVA PLUS) W/DEVICE KIT 1 Device by Does not apply route 3 (three) times daily after meals. 1 kit 0  . carvedilol (COREG) 25 MG tablet Take 1 tablet (25 mg total) by mouth 2 (two) times daily. 180 tablet 3  . cetirizine (ZYRTEC) 10 MG tablet Take 1 tablet (10 mg total) by mouth 2 (two) times daily. 60 tablet 5  . cyclobenzaprine (FLEXERIL) 5 MG tablet TAKE 1 TABLET BY MOUTH THREE TIMES DAILY AS NEEDED FOR MUSCLE SPASM **MUST  HAVE  OFFICE  VISIT  FOR  REFILLS** 60 tablet 0  . diphenhydrAMINE (BENADRYL) 50 MG capsule Take 50 mg by mouth at bedtime.    . famotidine (PEPCID) 20 MG tablet Take 1 tablet (20 mg total) by mouth 2 (two) times daily. 60 tablet 5  . fluticasone (FLONASE) 50 MCG/ACT nasal spray Place 1 spray into both nostrils daily. (Patient not taking: Reported on 06/28/2018) 16 g 0  . furosemide (LASIX) 80 MG tablet Take 1 tablet (80 mg total) by mouth 2 (two) times daily. 180 tablet 3  . gabapentin (NEURONTIN) 300 MG capsule Take 1 capsule (300 mg total) by mouth 3 (three) times daily. 90 capsule 6  . Lancet Devices (ACCU-CHEK SOFTCLIX) lancets 1 each by Other route 3 (three) times daily. (Patient not taking: Reported on 06/28/2018) 1 each 0  . metFORMIN (GLUCOPHAGE) 1000 MG tablet TAKE 1 TABLET BY MOUTH IN THE MORNING AND 1 & 1/2 (ONE & ONE-HALF) IN THE EVENING 75 tablet 1  . mupirocin ointment (BACTROBAN) 2 % Apply to affected area on abdomen twice daily 22 g 0  . pantoprazole (PROTONIX) 40 MG tablet Take 1 tablet (40 mg total) by mouth daily. 90 tablet 0  . potassium chloride SA (KLOR-CON M20) 20 MEQ tablet Take 1 tablet ( 20 meq) by mouth daily or as directed. (Patient taking differently: Take 20 mEq by mouth daily. ) 90 tablet 3    . predniSONE (DELTASONE) 20 MG tablet 1 tab daily x 3 days 3 tablet 3  . sacubitril-valsartan (ENTRESTO) 24-26 MG Take 1 tablet by mouth 2 (two) times daily. 180 tablet 3  . sitaGLIPtin (JANUVIA) 100 MG tablet Take 1 tablet (100 mg total) by mouth daily. 90 tablet 3  . tamsulosin (FLOMAX) 0.4 MG CAPS capsule Take 2 capsules (0.8 mg total) by mouth daily. (Patient taking differently: Take 0.4 mg by mouth 2 (two) times daily. ) 60 capsule 5  . triamcinolone cream (KENALOG) 0.1 % Apply to affected area on toe twice a day 30 g 0   No current facility-administered medications on file prior to visit.     Allergies  Allergen Reactions  .  Bee Venom Anaphylaxis    Social History   Socioeconomic History  . Marital status: Single    Spouse name: Not on file  . Number of children: Not on file  . Years of education: Not on file  . Highest education level: Not on file  Occupational History  . Occupation: Disabled  Social Needs  . Financial resource strain: Not on file  . Food insecurity:    Worry: Not on file    Inability: Not on file  . Transportation needs:    Medical: Not on file    Non-medical: Not on file  Tobacco Use  . Smoking status: Former Smoker    Packs/day: 1.00    Years: 28.00    Pack years: 28.00    Types: Cigarettes    Last attempt to quit: 06/03/2014    Years since quitting: 4.0  . Smokeless tobacco: Never Used  Substance and Sexual Activity  . Alcohol use: Yes    Alcohol/week: 0.0 standard drinks    Comment: 12/23/2013 "might drink a couple times/yr"  . Drug use: Yes    Types: Marijuana    Comment: 02/25/18- patient  states has not used marjuana  . Sexual activity: Yes  Lifestyle  . Physical activity:    Days per week: Not on file    Minutes per session: Not on file  . Stress: Not on file  Relationships  . Social connections:    Talks on phone: Not on file    Gets together: Not on file    Attends religious service: Not on file    Active member of club or  organization: Not on file    Attends meetings of clubs or organizations: Not on file    Relationship status: Not on file  . Intimate partner violence:    Fear of current or ex partner: Not on file    Emotionally abused: Not on file    Physically abused: Not on file    Forced sexual activity: Not on file  Other Topics Concern  . Not on file  Social History Narrative   Pt lives with his brother     Family History  Problem Relation Age of Onset  . Diabetes Father   . Arthritis Father   . Hypertension Father   . Diabetes Mother   . Arthritis Mother   . Hypertension Mother   . Hypertension Brother     Past Surgical History:  Procedure Laterality Date  . CARDIAC CATHETERIZATION  08/10/13  . CHOLECYSTECTOMY  ~ 2010  . IMPLANTABLE CARDIOVERTER DEFIBRILLATOR IMPLANT  12/23/2013   STJ Fortify ICD implanted by [name] for cardiomyopathy and syncope  . IMPLANTABLE CARDIOVERTER DEFIBRILLATOR IMPLANT N/A 12/23/2013   Procedure: IMPLANTABLE CARDIOVERTER DEFIBRILLATOR IMPLANT;  Surgeon: Coralyn Mark, MD;  Location: [facility] CATH LAB;  Service: Cardiovascular;  Laterality: N/A;  . INGUINAL HERNIA REPAIR Left 03/01/2018   Procedure: OPEN REPAIR OF LEFT INGUINAL HERNIA WITH MESH;  Surgeon: Greer Pickerel, MD;  Location: [facility];  Service: General;  Laterality: Left;  . LEFT HEART CATHETERIZATION WITH CORONARY ANGIOGRAM N/A 08/10/2013   Procedure: LEFT HEART CATHETERIZATION WITH CORONARY ANGIOGRAM;  Surgeon: Jettie Booze, MD;  Location: [facility];  Service: Cardiovascular;  Laterality: N/A;  . ORIF HUMERUS FRACTURE Left 08/12/2013   Procedure: OPEN REDUCTION INTERNAL FIXATION (ORIF) HUMERAL SHAFT FRACTURE;  Surgeon: Newt Minion, MD;  Location: [facility];  Service: Orthopedics;  Laterality: Left;  Open Reduction Internal Fixation Left Humerus  . URETEROSCOPY     "  laser for kidney stones"    ROS: Review of Systems Negative except as above   PHYSICAL EXAM: BP 107/76   Pulse 72   Temp 97.6 F  (36.4 C) (Oral)   Resp 16   Ht 6' (1.829 m)   Wt 257 lb 12.8 oz (116.9 kg)   SpO2 98%   BMI 34.96 kg/m   Wt Readings from Last 3 Encounters:  07/01/18 257 lb 12.8 oz (116.9 kg)  06/28/18 252 lb (114.3 kg)  04/30/18 252 lb 6.4 oz (114.5 kg)   Physical Exam  General appearance - alert, well appearing, and in no distress Mental status - normal mood, behavior, speech, dress, motor activity, and thought processes Nose - normal and patent, no erythema, discharge or polyps Mouth - mucous membranes moist, pharynx normal without lesions Neck - supple, no significant adenopathy Chest - clear to auscultation, no wheezes, rales or rhonchi, symmetric air entry Heart - normal rate, regular rhythm, normal S1, S2, no murmurs, rubs, clicks or gallops.  No JVD Extremities -no lower extremity or pedal edema. Leap exam is normal. Results for orders placed or performed in visit on 07/01/18  POCT glycosylated hemoglobin (Hb A1C)  Result Value Ref Range   Hemoglobin A1C     HbA1c POC (<> result, manual entry)     HbA1c, POC (prediabetic range)     HbA1c, POC (controlled diabetic range) 7.5 (A) 0.0 - 7.0 %  POCT glucose (manual entry)  Result Value Ref Range   POC Glucose 108 (A) 70 - 99 mg/dl     Chemistry      Component Value Date/Time   NA 142 06/28/2018 1440   K 4.0 06/28/2018 1440   CL 102 06/28/2018 1440   CO2 24 06/28/2018 1440   BUN 18 06/28/2018 1440   CREATININE 1.66 (H) 06/28/2018 1440   CREATININE 1.68 (H) 04/24/2016 1502      Component Value Date/Time   CALCIUM 9.3 06/28/2018 1440   ALKPHOS 50 06/28/2018 1440   AST 14 06/28/2018 1440   ALT 12 06/28/2018 1440   BILITOT 0.4 06/28/2018 1440       ASSESSMENT AND PLAN: 1. Diabetes mellitus type 2, uncontrolled, with complications ([place]) But improving A1C Rxn for new glucometer Commended him on continued regular exercise.  Cont healthy eating habits - POCT glycosylated hemoglobin (Hb A1C) - POCT glucose (manual entry) -  Ambulatory referral to Ophthalmology - Blood Glucose Monitoring Suppl (ACCU-CHEK AVIVA PLUS) w/Device KIT; 1 Device by Does not apply route 3 (three) times daily after meals.  Dispense: 1 kit; Refill: 0  2. Viral upper respiratory tract infection Conservative measures recommended Rxn for Xopenex inh given - benzonatate (TESSALON) 100 MG capsule; Take 1 capsule (100 mg total) by mouth 2 (two) times daily as needed for cough.  Dispense: 30 capsule; Refill: 0  3. Gastroesophageal reflux disease without esophagitis Concerning that symptoms not responding to BID PPI and Pepcid which was actually added recently for recurrent  Allergy/hive issue GERD precautions discussed.  Advise to eat last meal at least 2 hrs before laying down and sleep with head slightly elevated - Ambulatory referral to Gastroenterology - pantoprazole (PROTONIX) 40 MG tablet; Take 1 tablet (40 mg total) by mouth 2 (two) times daily before a meal.  Dispense: 60 tablet; Refill: 2  4. Impotence Will do trail of Viagra.  If no improvement, will refer to urology Pt advised of possible side effects of this medication including prolong erection, flushing, headaches, stuff nose and sudden  vision and hearing changes.  Pt told to be seen in ER if he has erection lasting longer than 3-4 hrs, or if he has sudden vision changes or hearing loss.  - sildenafil (VIAGRA) 50 MG tablet; 1-2  tab 1/2-1 hr prior to intercourse.  Limit use to 2 tabs/24 hrs  Dispense: 20 tablet; Refill: 1  5. Chronic systolic congestive heart failure (HCC) Stable.  Continue current meds  6. Ventricular tachycardia, non-sustained ([place]) Has ICD.  Followed by cardiology  7. CKD (chronic kidney disease), stage III (HCC) Improved on last blood test.  Discussed dx and importance of good DM and BP control.  Avoid NSAID  8. Hives Seeing allergy/immunology  9. Influenza vaccination declined   Patient was given the opportunity to ask questions.  Patient verbalized  understanding of the plan and was able to repeat key elements of the plan.   Orders Placed This Encounter  Procedures  . POCT glycosylated hemoglobin (Hb A1C)  . POCT glucose (manual entry)     Requested Prescriptions    No prescriptions requested or ordered in this encounter    No follow-ups on file.  Karle Plumber, MD, FACP

## 2018-07-01 NOTE — Patient Instructions (Signed)
Gastroesophageal Reflux Disease, Adult Gastroesophageal reflux (GER) happens when acid from the stomach flows up into the tube that connects the mouth and the stomach (esophagus). Normally, food travels down the esophagus and stays in the stomach to be digested. With GER, food and stomach acid sometimes move back up into the esophagus. You may have a disease called gastroesophageal reflux disease (GERD) if the reflux:  Happens often.  Causes frequent or very bad symptoms.  Causes problems such as damage to the esophagus. When this happens, the esophagus becomes sore and swollen (inflamed). Over time, GERD can make small holes (ulcers) in the lining of the esophagus. What are the causes? This condition is caused by a problem with the muscle between the esophagus and the stomach. When this muscle is weak or not normal, it does not close properly to keep food and acid from coming back up from the stomach. The muscle can be weak because of:  Tobacco use.  Pregnancy.  Having a certain type of hernia (hiatal hernia).  Alcohol use.  Certain foods and drinks, such as coffee, chocolate, onions, and peppermint. What increases the risk? You are more likely to develop this condition if you:  Are overweight.  Have a disease that affects your connective tissue.  Use NSAID medicines. What are the signs or symptoms? Symptoms of this condition include:  Heartburn.  Difficult or painful swallowing.  The feeling of having a lump in the throat.  A bitter taste in the mouth.  Bad breath.  Having a lot of saliva.  Having an upset or bloated stomach.  Belching.  Chest pain. Different conditions can cause chest pain. Make sure you see your doctor if you have chest pain.  Shortness of breath or noisy breathing (wheezing).  Ongoing (chronic) cough or a cough at night.  Wearing away of the surface of teeth (tooth enamel).  Weight loss. How is this treated? Treatment will depend on how  bad your symptoms are. Your doctor may suggest:  Changes to your diet.  Medicine.  Surgery. Follow these instructions at home: Eating and drinking   Follow a diet as told by your doctor. You may need to avoid foods and drinks such as: ? Coffee and tea (with or without caffeine). ? Drinks that contain alcohol. ? Energy drinks and sports drinks. ? Bubbly (carbonated) drinks or sodas. ? Chocolate and cocoa. ? Peppermint and mint flavorings. ? Garlic and onions. ? Horseradish. ? Spicy and acidic foods. These include peppers, chili powder, curry powder, vinegar, hot sauces, and BBQ sauce. ? Citrus fruit juices and citrus fruits, such as oranges, lemons, and limes. ? Tomato-based foods. These include red sauce, chili, salsa, and pizza with red sauce. ? Fried and fatty foods. These include donuts, french fries, potato chips, and high-fat dressings. ? High-fat meats. These include hot dogs, rib eye steak, sausage, ham, and bacon. ? High-fat dairy items, such as whole milk, butter, and cream cheese.  Eat small meals often. Avoid eating large meals.  Avoid drinking large amounts of liquid with your meals.  Avoid eating meals during the 2-3 hours before bedtime.  Avoid lying down right after you eat.  Do not exercise right after you eat. Lifestyle   Do not use any products that contain nicotine or tobacco. These include cigarettes, e-cigarettes, and chewing tobacco. If you need help quitting, ask your doctor.  Try to lower your stress. If you need help doing this, ask your doctor.  If you are overweight, lose an amount   of weight that is healthy for you. Ask your doctor about a safe weight loss goal. General instructions  Pay attention to any changes in your symptoms.  Take over-the-counter and prescription medicines only as told by your doctor. Do not take aspirin, ibuprofen, or other NSAIDs unless your doctor says it is okay.  Wear loose clothes. Do not wear anything tight  around your waist.  Raise (elevate) the head of your bed about 6 inches (15 cm).  Avoid bending over if this makes your symptoms worse.  Keep all follow-up visits as told by your doctor. This is important. Contact a doctor if:  You have new symptoms.  You lose weight and you do not know why.  You have trouble swallowing or it hurts to swallow.  You have wheezing or a cough that keeps happening.  Your symptoms do not get better with treatment.  You have a hoarse voice. Get help right away if:  You have pain in your arms, neck, jaw, teeth, or back.  You feel sweaty, dizzy, or light-headed.  You have chest pain or shortness of breath.  You throw up (vomit) and your throw-up looks like blood or coffee grounds.  You pass out (faint).  Your poop (stool) is bloody or black.  You cannot swallow, drink, or eat. Summary  If a person has gastroesophageal reflux disease (GERD), food and stomach acid move back up into the esophagus and cause symptoms or problems such as damage to the esophagus.  Treatment will depend on how bad your symptoms are.  Follow a diet as told by your doctor.  Take all medicines only as told by your doctor. This information is not intended to replace advice given to you by your health care provider. Make sure you discuss any questions you have with your health care provider. Document Released: 11/12/2007 Document Revised: 12/02/2017 Document Reviewed: 12/02/2017 Elsevier Interactive Patient Education  2019 [employer]  

## 2018-07-02 ENCOUNTER — Telehealth: Payer: Self-pay

## 2018-07-02 MED ORDER — EPINEPHRINE 0.3 MG/0.3ML IJ SOAJ: INTRAMUSCULAR | 1 refills | Status: DC

## 2018-07-02 NOTE — Telephone Encounter (Signed)
Lab results verbally given to pt.he verbally understood. Will mail to pt.'s home an Emergency Action plan,epipen pamphlet and lab results. Pt. Will come in next wk. For [place] to show him how to use the epipen 0.3mg  in the meantime he can review on youtube.Told pt. I will put all his positive results on his environmental control forms and send in an epipen 0.3mg  if not sent in already.

## 2018-07-08 ENCOUNTER — Other Ambulatory Visit: Payer: Self-pay | Admitting: Pharmacist

## 2018-07-08 DIAGNOSIS — E1165 Type 2 diabetes mellitus with hyperglycemia: Secondary | ICD-10-CM

## 2018-07-08 DIAGNOSIS — IMO0002 Reserved for concepts with insufficient information to code with codable children: Secondary | ICD-10-CM

## 2018-07-08 DIAGNOSIS — E118 Type 2 diabetes mellitus with unspecified complications: Principal | ICD-10-CM

## 2018-07-08 MED ORDER — GLUCOSE BLOOD VI STRP: ORAL_STRIP | 11 refills | Status: DC

## 2018-07-08 MED ORDER — ACCU-CHEK GUIDE W/DEVICE KIT: 1.0000 | PACK | Freq: Three times a day (TID) | 0 refills | Status: DC

## 2018-07-08 MED ORDER — ACCU-CHEK FASTCLIX LANCETS MISC: 11 refills | Status: DC

## 2018-07-09 ENCOUNTER — Telehealth: Payer: Self-pay | Admitting: Internal Medicine

## 2018-07-09 NOTE — Telephone Encounter (Signed)
Patient called stating that his pharmacy did not receive his sildenafil (VIAGRA) 50 MG tablet. Please f/u

## 2018-07-12 ENCOUNTER — Other Ambulatory Visit: Payer: Self-pay

## 2018-07-12 DIAGNOSIS — N529 Male erectile dysfunction, unspecified: Secondary | ICD-10-CM

## 2018-07-12 MED ORDER — SILDENAFIL CITRATE 50 MG PO TABS: ORAL_TABLET | ORAL | 1 refills | Status: DC

## 2018-07-12 NOTE — Telephone Encounter (Signed)
Resent rx to [place] [facility]

## 2018-07-13 ENCOUNTER — Other Ambulatory Visit: Payer: Self-pay | Admitting: Internal Medicine

## 2018-07-26 ENCOUNTER — Ambulatory Visit (INDEPENDENT_AMBULATORY_CARE_PROVIDER_SITE_OTHER): Payer: Medicaid Other | Admitting: Allergy

## 2018-07-26 ENCOUNTER — Encounter: Payer: Self-pay | Admitting: Allergy

## 2018-07-26 VITALS — BP 106/68 | HR 90 | Temp 98.3°F | Resp 16

## 2018-07-26 DIAGNOSIS — J3089 Other allergic rhinitis: Secondary | ICD-10-CM | POA: Diagnosis not present

## 2018-07-26 DIAGNOSIS — T782XXD Anaphylactic shock, unspecified, subsequent encounter: Secondary | ICD-10-CM

## 2018-07-26 DIAGNOSIS — T63481D Toxic effect of venom of other arthropod, accidental (unintentional), subsequent encounter: Secondary | ICD-10-CM | POA: Diagnosis not present

## 2018-07-26 DIAGNOSIS — L509 Urticaria, unspecified: Secondary | ICD-10-CM

## 2018-07-26 NOTE — Assessment & Plan Note (Addendum)
Past history - Rhinoconjunctivitis symptoms for the past 2 years mainly during the summer.  Interim history - Bloodwork was positive to dust mites, cat, dog, grass. Borderline positive to mold, cockroach and tree pollen.  Well controlled with zyrtec and continue to take.   Continue environmental control measures.

## 2018-07-26 NOTE — Patient Instructions (Addendum)
Urticaria  Continue zyrtec 10mg  twice a day.  May decrease pepcid to 20mg  once a day and stop after 1 month. May continue if it helps your reflux.   Take pictures when it flares.  Avoid the following potential triggers: alcohol, tight clothing, NSAIDs.   Anaphylaxis due to hymenoptera venom  Avoid insect stings.  For mild symptoms you can take over the counter antihistamines such as Benadryl and monitor symptoms closely. If symptoms worsen or if you have severe symptoms including breathing issues, throat closure, significant swelling, whole body hives, severe diarrhea and vomiting, lightheadedness then inject epinephrine and seek immediate medical care afterwards.  Other allergic rhinitis Bloodwork was positive to dust mites, cat, dog, grass. Borderline positive to mold, cockroach and tree pollen. The zyrtec should also help these symptoms.  Follow up in 3 months   Control of House Dust Mite Allergen . Dust mite allergens are a common trigger of allergy and asthma symptoms. While they can be found throughout the house, these microscopic creatures thrive in warm, humid environments such as bedding, upholstered furniture and carpeting. . Because so much time is spent in the bedroom, it is essential to reduce mite levels there.  . Encase pillows, mattresses, and box springs in special allergen-proof fabric covers or airtight, zippered plastic covers.  . Bedding should be washed weekly in hot water (130 F) and dried in a hot dryer. Allergen-proof covers are available for comforters and pillows that can't be regularly washed.  Wendee Copp the allergy-proof covers every few months. Minimize clutter in the bedroom. Keep pets out of the bedroom.  Marland Kitchen Keep humidity less than 50% by using a dehumidifier or air conditioning. You can buy a humidity measuring device called a hygrometer to monitor this.  . If possible, replace carpets with hardwood, linoleum, or washable area rugs. If that's not  possible, vacuum frequently with a vacuum that has a HEPA filter. . Remove all upholstered furniture and non-washable window drapes from the bedroom. . Remove all non-washable stuffed toys from the bedroom.  Wash stuffed toys weekly. Pet Allergen Avoidance: . Contrary to popular opinion, there are no "hypoallergenic" breeds of dogs or cats. That is because people are not allergic to an animal's hair, but to an allergen found in the animal's saliva, dander (dead skin flakes) or urine. Pet allergy symptoms typically occur within minutes. For some people, symptoms can build up and become most severe 8 to 12 hours after contact with the animal. People with severe allergies can experience reactions in public places if dander has been transported on the pet owners' clothing. Marland Kitchen Keeping an animal outdoors is only a partial solution, since homes with pets in the yard still have higher concentrations of animal allergens. . Before getting a pet, ask your allergist to determine if you are allergic to animals. If your pet is already considered part of your family, try to minimize contact and keep the pet out of the bedroom and other rooms where you spend a great deal of time. . As with dust mites, vacuum carpets often or replace carpet with a hardwood floor, tile or linoleum. . High-efficiency particulate air (HEPA) cleaners can reduce allergen levels over time. . While dander and saliva are the source of cat and dog allergens, urine is the source of allergens from rabbits, hamsters, mice and [place] pigs; so ask a non-allergic family member to clean the animal's cage. . If you have a pet allergy, talk to your allergist about the potential for allergy  immunotherapy (allergy shots). This strategy can often provide long-term relief. Reducing Pollen Exposure . Pollen seasons: trees (spring), grass (summer) and ragweed/weeds (fall). Marland Kitchen Keep windows closed in your home and car to lower pollen exposure.  Susa Simmonds air  conditioning in the bedroom and throughout the house if possible.  . Avoid going out in dry windy days - especially early morning. . Pollen counts are highest between 5 - 10 AM and on dry, hot and windy days.  . Save outside activities for late afternoon or after a heavy rain, when pollen levels are lower.  . Avoid mowing of grass if you have grass pollen allergy. Marland Kitchen Be aware that pollen can also be transported indoors on people and pets.  . Dry your clothes in an automatic dryer rather than hanging them outside where they might collect pollen.  . Rinse hair and eyes before bedtime. Mold Control . Mold and fungi can grow on a variety of surfaces provided certain temperature and moisture conditions exist.  . Outdoor molds grow on plants, decaying vegetation and soil. The major outdoor mold, Alternaria and Cladosporium, are found in very high numbers during hot and dry conditions. Generally, a late summer - fall peak is seen for common outdoor fungal spores. Rain will temporarily lower outdoor mold spore count, but counts rise rapidly when the rainy period ends. . The most important indoor molds are Aspergillus and Penicillium. Dark, humid and poorly ventilated basements are ideal sites for mold growth. The next most common sites of mold growth are the bathroom and the kitchen. Outdoor (Seasonal) Mold Control . Use air conditioning and keep windows closed. . Avoid exposure to decaying vegetation. Marland Kitchen Avoid leaf raking. . Avoid grain handling. . Consider wearing a face mask if working in moldy areas.  Indoor (Perennial) Mold Control  . Maintain humidity below 50%. . Get rid of mold growth on hard surfaces with water, detergent and, if necessary, 5% bleach (do not mix with other cleaners). Then dry the area completely. If mold covers an area more than 10 square feet, consider hiring an indoor environmental professional. . For clothing, washing with soap and water is best. If moldy items cannot be  cleaned and dried, throw them away. . Remove sources e.g. contaminated carpets. . Repair and seal leaking roofs or pipes. Using dehumidifiers in damp basements may be helpful, but empty the water and clean units regularly to prevent mildew from forming. All rooms, especially basements, bathrooms and kitchens, require ventilation and cleaning to deter mold and mildew growth. Avoid carpeting on concrete or damp floors, and storing items in damp areas. Cockroach Allergen Avoidance Cockroaches are often found in the homes of densely populated urban areas, schools or commercial buildings, but these creatures can lurk almost anywhere. This does not mean that you have a dirty house or living area. . Block all areas where roaches can enter the home. This includes crevices, wall cracks and windows.  . Cockroaches need water to survive, so fix and seal all leaky faucets and pipes. Have an exterminator go through the house when your family and pets are gone to eliminate any remaining roaches. Marland Kitchen Keep food in lidded containers and put pet food dishes away after your pets are done eating. Vacuum and sweep the floor after meals, and take out garbage and recyclables. Use lidded garbage containers in the kitchen. Wash dishes immediately after use and clean under stoves, refrigerators or toasters where crumbs can accumulate. Wipe off the stove and other kitchen surfaces  and cupboards regularly.

## 2018-07-26 NOTE — Progress Notes (Signed)
Follow Up Note  RE: [name] MRN: [id number] DOB: 02/24/1966 Date of Office Visit: 07/26/2018  Referring provider: [name], MD Primary care provider: [name], MD  Chief Complaint: Urticaria  History of Present Illness: I had the pleasure of seeing [name] for a follow up visit at the [facility] on 07/26/2018. He is a 53 y.o. male, who is being followed for urticaria, allergic rhinitis. Today he is here for regular follow up visit. His previous allergy office visit was on 06/28/2018 with Dr. Maudie Mercury.   Urticaria Currently zyrtec 66m twice a day and Pepcid 225mBID and doing well with no flares. May have had 1 little red spot since the last visit.   Anaphylaxis due to hymenoptera venom No stings since the last visit and has epipen on hand if needed.   Other allergic rhinitis Doing well with zyrtec   Assessment and Plan: ByIsas a [age].o. male with: Urticaria Past history - Rash/hives for 3-4 years but worse the last 7 months now having flat rash lasting up to 1 day. Describes them as pruritic and occurring on a daily basis. No triggers identified. Tried benadryl, zyrtec and Claritin with no benefit. Used oral steroids with minimal benefit. Interim history - Doing well with 1 minor rash since the last visit.  Continue zyrtec 1068mwice a day.  May decrease Pepcid to 39m1mce a day and stop after 1 month. May continue if it helps your reflux.   Take pictures when it flares.  Avoid the following potential triggers: alcohol, tight clothing, NSAIDs.   Other allergic rhinitis Past history - Rhinoconjunctivitis symptoms for the past 2 years mainly during the summer.  Interim history - Bloodwork was positive to dust mites, cat, dog, grass. Borderline positive to mold, cockroach and tree pollen.  Well controlled with zyrtec and continue to take.   Continue environmental control measures.   Anaphylaxis due to hymenoptera  venom Past history - Anaphylactic reaction to hymenoptera in the past and apparently was on allergy immunotherapy as a child for this. Most recently got stung a few years ago which required epinephrine use.  Interim history - bloodwork was borderline positive to paper wasp.  Avoid insect stings.  For mild symptoms you can take over the counter antihistamines such as Benadryl and monitor symptoms closely. If symptoms worsen or if you have severe symptoms including breathing issues, throat closure, significant swelling, whole body hives, severe diarrhea and vomiting, lightheadedness then inject epinephrine and seek immediate medical care afterwards.  If interested, recommend doing skin testing in future to confirm blood work results.   Return in about 3 months (around 10/24/2018).  Diagnostics: None.  Medication List:  Current Outpatient Medications  Medication Sig Dispense Refill  . ACCU-CHEK FASTCLIX LANCETS MISC Use to check blood sugar 3 times daily. 102 each 11  . aspirin 81 MG tablet Take 81 mg by mouth daily.    . benzonatate (TESSALON) 100 MG capsule Take 1 capsule (100 mg total) by mouth 2 (two) times daily as needed for cough. 30 capsule 0  . Blood Glucose Monitoring Suppl (ACCU-CHEK GUIDE) w/Device KIT 1 kit by Does not apply route 3 (three) times daily. Use to check blood sugar up to three times daily. 1 kit 0  . carvedilol (COREG) 25 MG tablet Take 1 tablet (25 mg total) by mouth 2 (two) times daily. 180 tablet 3  . cetirizine (ZYRTEC) 10 MG tablet Take 1 tablet (  10 mg total) by mouth 2 (two) times daily. 60 tablet 5  . cyclobenzaprine (FLEXERIL) 5 MG tablet Take 1 tablet (5 mg total) by mouth 3 (three) times daily as needed for muscle spasms. 60 tablet 0  . diphenhydrAMINE (BENADRYL) 50 MG capsule Take 50 mg by mouth at bedtime.    Marland Kitchen EPINEPHrine 0.3 mg/0.3 mL IJ SOAJ injection Use as directed for severe allergic reactions. 2 Device 1  . famotidine (PEPCID) 20 MG tablet Take 1  tablet (20 mg total) by mouth 2 (two) times daily. 60 tablet 5  . furosemide (LASIX) 80 MG tablet Take 1 tablet (80 mg total) by mouth 2 (two) times daily. 180 tablet 3  . gabapentin (NEURONTIN) 300 MG capsule Take 1 capsule (300 mg total) by mouth 3 (three) times daily. 90 capsule 6  . glucose blood (ACCU-CHEK GUIDE) test strip Use as instructed to check blood sugar 3 times daily. 100 each 11  . levalbuterol (XOPENEX HFA) 45 MCG/ACT inhaler Inhale 2 puffs into the lungs every 6 (six) hours as needed for wheezing. 1 Inhaler 1  . metFORMIN (GLUCOPHAGE) 1000 MG tablet TAKE 1 TABLET BY MOUTH IN THE MORNING AND 1 & 1/2 (ONE & ONE-HALF) IN THE EVENING 75 tablet 1  . mupirocin ointment (BACTROBAN) 2 % Apply to affected area on abdomen twice daily 22 g 0  . pantoprazole (PROTONIX) 40 MG tablet Take 1 tablet (40 mg total) by mouth 2 (two) times daily before a meal. 60 tablet 2  . potassium chloride SA (KLOR-CON M20) 20 MEQ tablet Take 1 tablet ( 20 meq) by mouth daily or as directed. (Patient taking differently: Take 20 mEq by mouth daily. ) 90 tablet 3  . predniSONE (DELTASONE) 20 MG tablet 1 tab daily x 3 days 3 tablet 3  . sacubitril-valsartan (ENTRESTO) 24-26 MG Take 1 tablet by mouth 2 (two) times daily. 180 tablet 3  . sildenafil (VIAGRA) 50 MG tablet 1-2  tab 1/2-1 hr prior to intercourse.  Limit use to 2 tabs/24 hrs 20 tablet 1  . sitaGLIPtin (JANUVIA) 100 MG tablet Take 1 tablet (100 mg total) by mouth daily. 90 tablet 3  . tamsulosin (FLOMAX) 0.4 MG CAPS capsule Take 2 capsules (0.8 mg total) by mouth daily. (Patient taking differently: Take 0.4 mg by mouth 2 (two) times daily. ) 60 capsule 5  . triamcinolone cream (KENALOG) 0.1 % Apply to affected area on toe twice a day 30 g 0  . atorvastatin (LIPITOR) 10 MG tablet Take 1 tablet (10 mg total) by mouth daily. (Patient not taking: Reported on 06/28/2018) 30 tablet 11  . fluticasone (FLONASE) 50 MCG/ACT nasal spray Place 1 spray into both nostrils  daily. (Patient not taking: Reported on 06/28/2018) 16 g 0  . Lancet Devices (ACCU-CHEK SOFTCLIX) lancets 1 each by Other route 3 (three) times daily. (Patient not taking: Reported on 06/28/2018) 1 each 0   No current facility-administered medications for this visit.    Allergies: Allergies  Allergen Reactions  . Bee Venom Anaphylaxis   I reviewed his past medical history, social history, family history, and environmental history and no significant changes have been reported from previous visit on 06/28/2018.  Review of Systems  Constitutional: Negative for appetite change, chills, fever and unexpected weight change.  HENT: Negative for congestion and rhinorrhea.   Eyes: Negative for itching.  Respiratory: Negative for cough, chest tightness, shortness of breath and wheezing.   Cardiovascular: Negative for chest pain.  Gastrointestinal: Negative for abdominal  pain.  Genitourinary: Negative for difficulty urinating.  Skin: Negative for rash.  Allergic/Immunologic: Positive for environmental allergies. Negative for food allergies.  Neurological: Negative for headaches.   Objective: BP 106/68   Pulse 90   Temp 98.3 F (36.8 C)   Resp 16  There is no height or weight on file to calculate BMI. Physical Exam  Constitutional: He is oriented to person, place, and time. He appears well-developed and well-nourished.  HENT:  Head: Normocephalic and atraumatic.  Right Ear: External ear normal.  Left Ear: External ear normal.  Nose: Nose normal.  Mouth/Throat: Oropharynx is clear and moist.  Eyes: Conjunctivae and EOM are normal.  Neck: Neck supple.  Cardiovascular: Normal rate, regular rhythm and normal heart sounds. Exam reveals no gallop and no friction rub.  No murmur heard. Pulmonary/Chest: Effort normal and breath sounds normal. He has no wheezes. He has no rales.  Abdominal: Soft.  Lymphadenopathy:    He has no cervical adenopathy.  Neurological: He is alert and oriented to  person, place, and time.  Skin: Skin is warm. No rash noted.  Psychiatric: He has a normal mood and affect. His behavior is normal.  Nursing note and vitals reviewed.  Previous notes and tests were reviewed. The plan was reviewed with the patient/family, and all questions/concerned were addressed.  It was my pleasure to see Chandra today and participate in his care. Please feel free to contact me with any questions or concerns.  Sincerely,  Rexene Alberts, DO Allergy & Immunology  [facility] office: [phone] [facility] office: [phone]

## 2018-07-26 NOTE — Assessment & Plan Note (Addendum)
Past history - Rash/hives for 3-4 years but worse the last 7 months now having flat rash lasting up to 1 day. Describes them as pruritic and occurring on a daily basis. No triggers identified. Tried benadryl, zyrtec and Claritin with no benefit. Used oral steroids with minimal benefit. Interim history - Doing well with 1 minor rash since the last visit.  Continue zyrtec 10mg  twice a day.  May decrease Pepcid to 20mg  once a day and stop after 1 month. May continue if it helps your reflux.   Take pictures when it flares.  Avoid the following potential triggers: alcohol, tight clothing, NSAIDs.

## 2018-07-26 NOTE — Assessment & Plan Note (Signed)
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 Most recently got stung a few years ago which required epinephrine use.  Interim history - bloodwork was borderline positive to paper wasp.  Avoid insect stings.  For mild symptoms you can take over the counter antihistamines such as Benadryl and monitor symptoms closely. If symptoms worsen or if you have severe symptoms including breathing issues, throat closure, significant swelling, whole body hives, severe diarrhea and vomiting, lightheadedness then inject epinephrine and seek immediate medical care afterwards.  If interested, recommend doing skin testing in future to confirm blood work results.

## 2018-08-02 ENCOUNTER — Ambulatory Visit (INDEPENDENT_AMBULATORY_CARE_PROVIDER_SITE_OTHER): Payer: Medicaid Other

## 2018-08-02 DIAGNOSIS — I5022 Chronic systolic (congestive) heart failure: Secondary | ICD-10-CM

## 2018-08-02 DIAGNOSIS — Z95810 Presence of automatic (implantable) cardiac defibrillator: Secondary | ICD-10-CM | POA: Diagnosis not present

## 2018-08-03 ENCOUNTER — Other Ambulatory Visit: Payer: Self-pay | Admitting: Internal Medicine

## 2018-08-03 NOTE — Progress Notes (Signed)
EPIC Encounter for ICM Monitoring  Patient Name: [name] is a 53 y.o. male Date: 08/03/2018 Primary Care Physican: [name] Pier, MD Primary Cardiologist:Crenshaw Electrophysiologist: Allred Last weight: 239 lbs Today's [id number]  Heart failure questions reviewed.  He said he has occasional shortness of breath.  He did not have any fluid symptoms during decreased impedance.   Thoracic impedance normal.  Prescribed dosage:Furosemide 80 mg 1 tablet twice a day. Klor Con20 mEq 1 tablet daily.  Labs: 02/26/2018 Creatinine 1.99, BUN 23, Potassium 4.3, Sodium 141, eGFR 37-42 11/16/2017 Creatinine 1.78, BUN 21, Potassium 4.2, Sodium 138, EGFR 43-49 09/21/2017 Creatinine1.56, BUN20, Potassium3.6, Sodium139, [id number] 09/19/2017 Creatinine1.36, BUN16, Potassium4.3, Sodium138, EGFR58->60  09/18/2017 Creatinine1.46, EGFR54->60  09/17/2017 Creatinine1.55, BUN20, Potassium3.6, [id number], [id number]  Recommendations:.No changes.  Encouraged to call for fluid symptoms.  Follow-up plan: ICM clinic phone appointment on3/30/2020.   Copy of ICM check sent to Dr.Allred.   3 month ICM trend: 08/02/2018    1 Year ICM trend:       [name] Billings, RN 08/03/2018 12:42 PM

## 2018-08-22 ENCOUNTER — Other Ambulatory Visit: Payer: Self-pay | Admitting: Internal Medicine

## 2018-08-22 DIAGNOSIS — E118 Type 2 diabetes mellitus with unspecified complications: Secondary | ICD-10-CM

## 2018-08-22 DIAGNOSIS — Z794 Long term (current) use of insulin: Principal | ICD-10-CM

## 2018-09-02 ENCOUNTER — Other Ambulatory Visit: Payer: Self-pay | Admitting: Internal Medicine

## 2018-09-02 DIAGNOSIS — E1142 Type 2 diabetes mellitus with diabetic polyneuropathy: Secondary | ICD-10-CM

## 2018-09-06 ENCOUNTER — Ambulatory Visit (INDEPENDENT_AMBULATORY_CARE_PROVIDER_SITE_OTHER): Payer: Medicaid Other

## 2018-09-06 ENCOUNTER — Other Ambulatory Visit: Payer: Self-pay

## 2018-09-06 DIAGNOSIS — I5022 Chronic systolic (congestive) heart failure: Secondary | ICD-10-CM | POA: Diagnosis not present

## 2018-09-06 DIAGNOSIS — Z95810 Presence of automatic (implantable) cardiac defibrillator: Secondary | ICD-10-CM

## 2018-09-08 ENCOUNTER — Other Ambulatory Visit: Payer: Self-pay

## 2018-09-08 ENCOUNTER — Ambulatory Visit (INDEPENDENT_AMBULATORY_CARE_PROVIDER_SITE_OTHER): Payer: Medicaid Other | Admitting: *Deleted

## 2018-09-08 DIAGNOSIS — I428 Other cardiomyopathies: Secondary | ICD-10-CM | POA: Diagnosis not present

## 2018-09-08 NOTE — Progress Notes (Signed)
EPIC Encounter for ICM Monitoring  Patient Name: [name] is a 53 y.o. male Date: 09/08/2018 Primary Care Physican: [name], MD Primary Cardiologist:Crenshaw Electrophysiologist: Allred 09/08/2018 [id number]  Heart failure questions reviewed.  He reports feeling okay at this time but did have a minimal amount of shortness of breath a few days ago.  Thoracic impedancenormal.  Prescribed dosage:Furosemide 80 mg 1 tablet twice a day. Klor Con20 mEq 1 tablet daily.  Labs: 06/28/2018 Creatinine 1.66, BUN 18, Potassium 4.0, Sodium 142, GFR 46-54 02/26/2018 Creatinine 1.99, BUN 23, Potassium 4.3, Sodium 141, GFR 37-42 Complete set of lab results in result review.   Recommendations:.No changes. Encouraged to call for fluid symptoms.  Follow-up plan: ICM clinic phone appointment on5/09/2018.   Copy of ICM check sent to Dr.Allred.   3 month ICM trend: 09/06/2018    1 Year ICM trend:       [name], RN 09/08/2018 5:14 PM

## 2018-09-10 LAB — CUP PACEART REMOTE DEVICE CHECK
Battery Remaining Longevity: 65 mo
Battery Remaining Percentage: 61 %
Battery Voltage: 2.95 V
Brady Statistic RV Percent Paced: 1 %
HighPow Impedance: 70 Ohm
HighPow Impedance: 70 Ohm
Implantable Lead Implant Date: 20150717
Implantable Lead Location: 753860
Implantable Pulse Generator Implant Date: 20150717
Lead Channel Impedance Value: 390 Ohm
Lead Channel Pacing Threshold Amplitude: 0.75 V
Lead Channel Pacing Threshold Pulse Width: 0.5 ms
Lead Channel Sensing Intrinsic Amplitude: 12 mV
Lead Channel Setting Pacing Amplitude: 2.5 V
Lead Channel Setting Sensing Sensitivity: 0.5 mV
MDC IDC SESS DTM: 20200330080021
MDC IDC SET LEADCHNL RV PACING PULSEWIDTH: 0.5 ms
Pulse Gen Serial Number: 7196009

## 2018-09-13 ENCOUNTER — Encounter: Payer: Self-pay | Admitting: Cardiology

## 2018-09-13 NOTE — Progress Notes (Signed)
Remote ICD transmission.   

## 2018-09-20 ENCOUNTER — Telehealth: Payer: Self-pay | Admitting: Physician Assistant

## 2018-09-20 NOTE — Telephone Encounter (Signed)
New Message   Pt c/o medication issue:  1. Name of Medication: sacubitril-valsartan (ENTRESTO) 24-26 MG    2. How are you currently taking this medication (dosage and times per day)?   3. Are you having a reaction (difficulty breathing--STAT)?   4. What is your medication issue? Patient is calling because the pharmacy is saying that the Delene Loll is no longer being covered.She states that the insurance is requesting a prior authorization. Please advise.

## 2018-09-23 NOTE — Telephone Encounter (Signed)
See previous note,

## 2018-09-23 NOTE — Telephone Encounter (Signed)
I tried to do it online and over the phone. Was told by the rep you have to go to [email or web address] and complete form and fax in. Message will be returned to sender as I am working from home and do not have access to a fax machine.

## 2018-09-23 NOTE — Telephone Encounter (Signed)
New Message   Patient calling to see if the prior authorization has been completed for medication.

## 2018-09-24 ENCOUNTER — Telehealth: Payer: Self-pay | Admitting: Cardiology

## 2018-09-24 ENCOUNTER — Other Ambulatory Visit: Payer: Self-pay | Admitting: Internal Medicine

## 2018-09-24 DIAGNOSIS — N138 Other obstructive and reflux uropathy: Secondary | ICD-10-CM

## 2018-09-24 DIAGNOSIS — N401 Enlarged prostate with lower urinary tract symptoms: Principal | ICD-10-CM

## 2018-09-24 NOTE — Telephone Encounter (Signed)
Spoke with patient he is needing his [place] PA completed,. After reviewing the chart, I see where the forms are going to have to be printed, filled out and then faxed in. I advised patient I would route message to in house nurse and assistant to have them attempt to fill out and fax back in,   Form found on [email or web address].   Patient has been out of medication for 5 days.   Please, thank you!

## 2018-09-24 NOTE — Telephone Encounter (Signed)
New Message   Patient would like a call in regards to his medication.

## 2018-09-24 NOTE — Telephone Encounter (Signed)
Spoke to patient advised I left 1 bottle of Entresto 24/26 mg.[place] track prior authorization form completed and faxed to [phone].

## 2018-09-28 ENCOUNTER — Telehealth: Payer: Self-pay | Admitting: *Deleted

## 2018-09-28 NOTE — Telephone Encounter (Signed)
Called [place] tracks through # provided by cover my meds. ( per cover my meds they are not contracted with Medicaid.) PA for Entresto done. PA # [id number]. Call reference # [id number].

## 2018-09-28 NOTE — Telephone Encounter (Signed)
-----   Message from Cristopher Estimable, RN sent at 09/27/2018  8:16 AM EDT ----- PA started in cover-my-meds for entresto-key [id number] patient last name-Somma dob=[id number]

## 2018-09-29 ENCOUNTER — Telehealth: Payer: Self-pay | Admitting: *Deleted

## 2018-09-29 NOTE — Telephone Encounter (Signed)
Unable to leave a message at this time, no voice mail.

## 2018-10-04 ENCOUNTER — Ambulatory Visit: Payer: Medicaid Other | Attending: Internal Medicine | Admitting: Internal Medicine

## 2018-10-04 ENCOUNTER — Other Ambulatory Visit: Payer: Self-pay

## 2018-10-04 DIAGNOSIS — E1165 Type 2 diabetes mellitus with hyperglycemia: Secondary | ICD-10-CM | POA: Diagnosis not present

## 2018-10-04 DIAGNOSIS — E1169 Type 2 diabetes mellitus with other specified complication: Secondary | ICD-10-CM

## 2018-10-04 DIAGNOSIS — L309 Dermatitis, unspecified: Secondary | ICD-10-CM | POA: Diagnosis not present

## 2018-10-04 DIAGNOSIS — I5022 Chronic systolic (congestive) heart failure: Secondary | ICD-10-CM

## 2018-10-04 DIAGNOSIS — R200 Anesthesia of skin: Secondary | ICD-10-CM

## 2018-10-04 DIAGNOSIS — E118 Type 2 diabetes mellitus with unspecified complications: Secondary | ICD-10-CM | POA: Diagnosis not present

## 2018-10-04 DIAGNOSIS — E785 Hyperlipidemia, unspecified: Secondary | ICD-10-CM | POA: Diagnosis not present

## 2018-10-04 DIAGNOSIS — K219 Gastro-esophageal reflux disease without esophagitis: Secondary | ICD-10-CM | POA: Diagnosis not present

## 2018-10-04 DIAGNOSIS — N529 Male erectile dysfunction, unspecified: Secondary | ICD-10-CM | POA: Diagnosis not present

## 2018-10-04 DIAGNOSIS — IMO0002 Reserved for concepts with insufficient information to code with codable children: Secondary | ICD-10-CM

## 2018-10-04 MED ORDER — ATORVASTATIN CALCIUM 10 MG PO TABS: 10.0000 mg | ORAL_TABLET | Freq: Every day | ORAL | 11 refills | Status: DC

## 2018-10-04 MED ORDER — TERBINAFINE HCL 1 % EX CREA: 1.0000 "application " | TOPICAL_CREAM | Freq: Two times a day (BID) | CUTANEOUS | 0 refills | Status: DC

## 2018-10-04 NOTE — Progress Notes (Signed)
Telephone Encounter  I connected with [name] on 10/04/18 at  4:33 p.m by telephone from my office and verified that I am speaking with the correct person using two identifiers.  Pt is at home. Pt and myself participated in this encounter   I discussed the limitations of evaluation and management by telemedicine and the availability of in person appointments. The patient expressed understanding and agreed to proceed.  History of Present Illness: Patient with history of NICM, systolic CHF status post ICD placement,DMtype 2, HL, CKD stage 3, obesity, BPH, and chronic lower back pain secondary to spondylosis and spinal stenosis.   Patient reports that he was having a little pain in the upper and mid back of his back today.  Patient did not elaborate on this but quickly moved on to other issues that are concerning him.  Patient had left inguinal hernia repair in September of last year.  Still has numbness LT side of groin and is impotent.  Reports being told by the surgeon that he had a little bit of nerve damage during the surgery but that the numbness should get better in about 6 months and it has not.  Has appt with surgeon on Wednesday who did hernia repair Viagra was prescribed on last visit.  It has not help  Complains of having a rash on the dorsal surface of the left big toe on his visit with me in November.  Diagnosed with contact dermatitis and was prescribed triamcinolone.  He reports that the rash is no better or worse and it is very itchy.  He has been using the triamcinolone cream once a day instead of twice daily.    Hives: seeing Dr. Maudie Mercury.  Better with daily Zyrtec and Pepcid twice a day  GERD: Referred to GI on last visit due to GERD that was not improving with Protonix.  He states that he was never called for the appointment with the gastroenterologist but symptoms are controlled with Pepcid BID and Protonix BID  DM/Obesity:  On last visit, wgh was 258 lbs, now 231 lbs. He  has set a goal to get down to 220 lbs Not checking blood sugars regularly Doing better with eating habits.  No snacking in b/w meals.  Still walking daily Eye exam was cancelled due to COVID19 Last A1C 7.5  CHF/NICM/NSVT:  Compliant with Entresto, Furosemide, Coreg.  Needs refill on Lipitor No CP/PND/orthopnea  Observations/Objective: No direct observation done as this was a telephone encounter Lab Results  Component Value Date   WBC 4.5 06/28/2018   HGB 12.5 (L) 06/28/2018   HCT 36.9 (L) 06/28/2018   MCV 89 06/28/2018   PLT 301 06/28/2018     Chemistry      Component Value Date/Time   NA 142 06/28/2018 1440   K 4.0 06/28/2018 1440   CL 102 06/28/2018 1440   CO2 24 06/28/2018 1440   BUN 18 06/28/2018 1440   CREATININE 1.66 (H) 06/28/2018 1440   CREATININE 1.68 (H) 04/24/2016 1502      Component Value Date/Time   CALCIUM 9.3 06/28/2018 1440   ALKPHOS 50 06/28/2018 1440   AST 14 06/28/2018 1440   ALT 12 06/28/2018 1440   BILITOT 0.4 06/28/2018 1440     Lab Results  Component Value Date   CHOL 204 (H) 04/17/2017   HDL 37 (L) 04/17/2017   LDLCALC 103 (H) 04/17/2017   TRIG 321 (H) 04/17/2017   CHOLHDL 5.5 (H) 04/17/2017    Assessment and Plan:  1. Impotence Patient feels this may be related to the nerve damage sustained during his inguinal hernia repair.  This may or may not be the case. - Ambulatory referral to Urology  2. Numbness Patient told that with nerve damage it can sometimes take up to a year for nerve to repair itself provided it was not completely severed.  He plans to keep his appointment with the general surgeon later this week  3. Gastroesophageal reflux disease without esophagitis Patient reports symptoms controlled with Pepcid and Protonix.  I advised that he try cutting back the Protonix to once daily dosing.  He will try to do that  4. Diabetes mellitus type 2, uncontrolled, with complications ([place]) Commended him on his weight loss.  Hopefully  his A1c has fallen below 7 given the weight loss.  Encouraged him to continue healthy eating habits and regular exercise. - Hemoglobin A1c; Future - Lipid panel; Future - Basic Metabolic Panel; Future  5. Chronic systolic congestive heart failure (HCC) No symptoms to suggest decompensation  6. Hyperlipidemia associated with type 2 diabetes mellitus (HCC) - atorvastatin (LIPITOR) 10 MG tablet; Take 1 tablet (10 mg total) by mouth daily.  Dispense: 30 tablet; Refill: 11  7. Dermatitis of right foot Advised patient to use the triamcinolone cream twice a day.  We will also add an antifungal.  If no improvement with these measures, next up will be referral to dermatology. - terbinafine (LAMISIL AT) 1 % cream; Apply 1 application topically 2 (two) times daily.  Dispense: 30 g; Refill: 0   Follow Up Instructions:    I discussed the assessment and treatment plan with the patient. The patient was provided an opportunity to ask questions and all were answered. The patient agreed with the plan and demonstrated an understanding of the instructions.   The patient was advised to call back or seek an in-person evaluation if the symptoms worsen or if the condition fails to improve as anticipated.  I provided 30 minutes of non-face-to-face time during this encounter.   Karle Plumber, MD

## 2018-10-06 DIAGNOSIS — E1169 Type 2 diabetes mellitus with other specified complication: Secondary | ICD-10-CM | POA: Diagnosis not present

## 2018-10-06 DIAGNOSIS — N529 Male erectile dysfunction, unspecified: Secondary | ICD-10-CM | POA: Diagnosis not present

## 2018-10-11 ENCOUNTER — Other Ambulatory Visit: Payer: Self-pay

## 2018-10-12 ENCOUNTER — Telehealth: Payer: Self-pay

## 2018-10-12 NOTE — Telephone Encounter (Signed)
Spoke with patient to remind of missed remote transmission 

## 2018-10-13 ENCOUNTER — Other Ambulatory Visit: Payer: Self-pay

## 2018-10-13 ENCOUNTER — Ambulatory Visit (HOSPITAL_BASED_OUTPATIENT_CLINIC_OR_DEPARTMENT_OTHER): Payer: Medicaid Other | Admitting: Physician Assistant

## 2018-10-13 ENCOUNTER — Ambulatory Visit: Payer: Medicaid Other | Attending: Family Medicine

## 2018-10-13 DIAGNOSIS — IMO0002 Reserved for concepts with insufficient information to code with codable children: Secondary | ICD-10-CM

## 2018-10-13 DIAGNOSIS — E1165 Type 2 diabetes mellitus with hyperglycemia: Secondary | ICD-10-CM | POA: Diagnosis not present

## 2018-10-13 DIAGNOSIS — E118 Type 2 diabetes mellitus with unspecified complications: Principal | ICD-10-CM

## 2018-10-13 DIAGNOSIS — R0789 Other chest pain: Secondary | ICD-10-CM | POA: Diagnosis not present

## 2018-10-13 DIAGNOSIS — R079 Chest pain, unspecified: Secondary | ICD-10-CM

## 2018-10-13 NOTE — Progress Notes (Signed)
Virtual Visit via Telephone Note  I connected with [name] on 10/13/18 at  2:50 PM EDT by telephone and verified that I am speaking with the correct person using two identifiers.   I discussed the limitations, risks, security and privacy concerns of performing an evaluation and management service by telephone and the availability of in person appointments. I also discussed with the patient that there may be a patient responsible charge related to this service. The patient expressed understanding and agreed to proceed.  Patient location:  home My Location:  [place] office Persons on the call:  Myself and the patient.    History of Present Illness: L side pain under ribs and into back.  Occurred ~1 week ago.  Lasted about 5-6 days.  Worse with movement. No N/V. No CP/No SOB.  Pain was constant and sharp.  No fever.  Pain lessened yesterday and resolved spontaneously.  No pain today.  Tylenol helped yesterday.      Observations/Objective:  TP linear.  Speech is clear.     Assessment and Plan: 1. Left-sided chest pain Sounds musculoskeletal and had improved as of yesterday and is gone today.  No red flags but discussed that if pain returns/worses/changes, he should call 911/go to the ED.   Follow Up Instructions: F/up with PCP as planned.     I discussed the assessment and treatment plan with the patient. The patient was provided an opportunity to ask questions and all were answered. The patient agreed with the plan and demonstrated an understanding of the instructions.   The patient was advised to call back or seek an in-person evaluation if the symptoms worsen or if the condition fails to improve as anticipated.  I provided 11 minutes of non-face-to-face time during this encounter.   [name] Caldron, PA-C  Patient ID: [name], male   DOB: July 20, 1965, 53 y.o.   MRN: [id number]

## 2018-10-13 NOTE — Progress Notes (Signed)
Patient has chest pain underneath the left rib and radiates to his back.  Patient states pain better today but was more painful earlier in the week.  Pain 3/10

## 2018-10-14 LAB — BASIC METABOLIC PANEL
BUN/Creatinine Ratio: 14 (ref 9–20)
BUN: 38 mg/dL — ABNORMAL HIGH (ref 6–24)
CO2: 24 mmol/L (ref 20–29)
Calcium: 9.5 mg/dL (ref 8.7–10.2)
Chloride: 103 mmol/L (ref 96–106)
Creatinine, Ser: 2.75 mg/dL — ABNORMAL HIGH (ref 0.76–1.27)
GFR calc Af Amer: 29 mL/min/{1.73_m2} — ABNORMAL LOW (ref >59)
GFR calc non Af Amer: 25 mL/min/{1.73_m2} — ABNORMAL LOW (ref >59)
Glucose: 118 mg/dL — ABNORMAL HIGH (ref 65–99)
Potassium: 4.4 mmol/L (ref 3.5–5.2)
Sodium: 142 mmol/L (ref 134–144)

## 2018-10-14 LAB — LIPID PANEL
Chol/HDL Ratio: 5.7 ratio — ABNORMAL HIGH (ref 0.0–5.0)
Cholesterol, Total: 205 mg/dL — ABNORMAL HIGH (ref 100–199)
HDL: 36 mg/dL — ABNORMAL LOW (ref >39)
LDL Calculated: 131 mg/dL — ABNORMAL HIGH (ref 0–99)
Triglycerides: 192 mg/dL — ABNORMAL HIGH (ref 0–149)
VLDL Cholesterol Cal: 38 mg/dL (ref 5–40)

## 2018-10-14 LAB — HEMOGLOBIN A1C
Est. average glucose Bld gHb Est-mCnc: 154 mg/dL
Hgb A1c MFr Bld: 7 % — ABNORMAL HIGH (ref 4.8–5.6)

## 2018-10-15 ENCOUNTER — Telehealth: Payer: Self-pay | Admitting: Internal Medicine

## 2018-10-15 DIAGNOSIS — E1165 Type 2 diabetes mellitus with hyperglycemia: Secondary | ICD-10-CM

## 2018-10-15 DIAGNOSIS — N184 Chronic kidney disease, stage 4 (severe): Secondary | ICD-10-CM

## 2018-10-15 DIAGNOSIS — IMO0002 Reserved for concepts with insufficient information to code with codable children: Secondary | ICD-10-CM

## 2018-10-15 MED ORDER — GLIMEPIRIDE 2 MG PO TABS: 1.0000 mg | ORAL_TABLET | Freq: Every day | ORAL | 3 refills | Status: DC

## 2018-10-15 NOTE — Telephone Encounter (Signed)
Phone call placed to patient this morning.  Patient informed that his kidney function has worsened compared to 3 months ago.  Because of this, I recommend: 1.  discontinue metformin and consider being placed on low dose of once daily insulin called Lantus or once weekly Trulicity. 2.  Change Protonix to once daily dosing as discussed on recent visit.  Change to taking Pepcid once every other day. 3.  Referred to nephrology for further evaluation and management.  Patient is adamant and tearful about not wanting to be on insulin or any injectable medication.  Advised patient that when kidney function declines at this level a lot of times it is best to switch from orals to insulin.  Patient declines.  So we decided to have him stop metformin and try him on low-dose of Amaryl.  He will continue the Januvia.  Monitor BS closely.  Patient informed that LDL was not at goal which is to be less than 70.  He was not taking the Lipitor consistently.  He will start doing so.  His A1c is 7 with goal being less than 7.

## 2018-10-19 NOTE — Progress Notes (Signed)
No ICM remote transmission received for 10/11/2018 and next ICM transmission scheduled for 11/08/2018.

## 2018-10-22 ENCOUNTER — Other Ambulatory Visit: Payer: Self-pay | Admitting: Family Medicine

## 2018-10-22 DIAGNOSIS — E1142 Type 2 diabetes mellitus with diabetic polyneuropathy: Secondary | ICD-10-CM

## 2018-10-25 ENCOUNTER — Other Ambulatory Visit: Payer: Self-pay

## 2018-10-25 ENCOUNTER — Ambulatory Visit (INDEPENDENT_AMBULATORY_CARE_PROVIDER_SITE_OTHER): Payer: Medicaid Other | Admitting: Allergy

## 2018-10-25 ENCOUNTER — Encounter: Payer: Self-pay | Admitting: Allergy

## 2018-10-25 DIAGNOSIS — K219 Gastro-esophageal reflux disease without esophagitis: Secondary | ICD-10-CM | POA: Diagnosis not present

## 2018-10-25 DIAGNOSIS — L509 Urticaria, unspecified: Secondary | ICD-10-CM

## 2018-10-25 DIAGNOSIS — J3089 Other allergic rhinitis: Secondary | ICD-10-CM

## 2018-10-25 DIAGNOSIS — T782XXD Anaphylactic shock, unspecified, subsequent encounter: Secondary | ICD-10-CM

## 2018-10-25 DIAGNOSIS — J302 Other seasonal allergic rhinitis: Secondary | ICD-10-CM | POA: Diagnosis not present

## 2018-10-25 DIAGNOSIS — H1010 Acute atopic conjunctivitis, unspecified eye: Secondary | ICD-10-CM | POA: Diagnosis not present

## 2018-10-25 DIAGNOSIS — T63481D Toxic effect of venom of other arthropod, accidental (unintentional), subsequent encounter: Secondary | ICD-10-CM

## 2018-10-25 MED ORDER — FLUTICASONE PROPIONATE 50 MCG/ACT NA SUSP: NASAL | 3 refills | Status: DC

## 2018-10-25 MED ORDER — CETIRIZINE HCL 10 MG PO TABS: 10.0000 mg | ORAL_TABLET | Freq: Every day | ORAL | 3 refills | Status: DC

## 2018-10-25 MED ORDER — FAMOTIDINE 20 MG PO TABS: 20.0000 mg | ORAL_TABLET | Freq: Every day | ORAL | 3 refills | Status: DC

## 2018-10-25 NOTE — Patient Instructions (Addendum)
Urticaria  Decrease Zyrtec to 10mg  once a day.  Continue Pepcid 20mg  once a day.  If no hives for 1 month then stop Pepcid and only take zyrtec 10mg  once a day.  Take pictures when it flares.  Avoid the following potential triggers: alcohol, tight clothing, NSAIDs.  Other allergic rhinitis Past history - Bloodwork was positive to dust mites, cat, dog, grass. Borderline positive to mold, cockroach and tree pollen.  Continue zyrtec 10mg  daily.  Start Flonase 1-2 sprays in each nostril once a day.   Continue environmental control measures.   Anaphylaxis due to hymenoptera venom Bloodwork was borderline positive to paper wasp.  Avoid insect stings.  For mild symptoms you can take over the counter antihistamines such as Benadryl and monitor symptoms closely. If symptoms worsen or if you have severe symptoms including breathing issues, throat closure, significant swelling, whole body hives, severe diarrhea and vomiting, lightheadedness then inject epinephrine and seek immediate medical care afterwards.  Follow up in 3 months Follow up with nephrology as scheduled. If they have issues with you taking Zyrtec or Pepcid please let me know.  Reducing Pollen Exposure . Pollen seasons: trees (spring), grass (summer) and ragweed/weeds (fall). Marland Kitchen Keep windows closed in your home and car to lower pollen exposure.  Susa Simmonds air conditioning in the bedroom and throughout the house if possible.  . Avoid going out in dry windy days - especially early morning. . Pollen counts are highest between 5 - 10 AM and on dry, hot and windy days.  . Save outside activities for late afternoon or after a heavy rain, when pollen levels are lower.  . Avoid mowing of grass if you have grass pollen allergy. Marland Kitchen Be aware that pollen can also be transported indoors on people and pets.  . Dry your clothes in an automatic dryer rather than hanging them outside where they might collect pollen.  . Rinse hair and eyes  before bedtime. Control of House Dust Mite Allergen . Dust mite allergens are a common trigger of allergy and asthma symptoms. While they can be found throughout the house, these microscopic creatures thrive in warm, humid environments such as bedding, upholstered furniture and carpeting. . Because so much time is spent in the bedroom, it is essential to reduce mite levels there.  . Encase pillows, mattresses, and box springs in special allergen-proof fabric covers or airtight, zippered plastic covers.  . Bedding should be washed weekly in hot water (130 F) and dried in a hot dryer. Allergen-proof covers are available for comforters and pillows that can't be regularly washed.  Wendee Copp the allergy-proof covers every few months. Minimize clutter in the bedroom. Keep pets out of the bedroom.  Marland Kitchen Keep humidity less than 50% by using a dehumidifier or air conditioning. You can buy a humidity measuring device called a hygrometer to monitor this.  . If possible, replace carpets with hardwood, linoleum, or washable area rugs. If that's not possible, vacuum frequently with a vacuum that has a HEPA filter. . Remove all upholstered furniture and non-washable window drapes from the bedroom. . Remove all non-washable stuffed toys from the bedroom.  Wash stuffed toys weekly. Pet Allergen Avoidance: . Contrary to popular opinion, there are no "hypoallergenic" breeds of dogs or cats. That is because people are not allergic to an animal's hair, but to an allergen found in the animal's saliva, dander (dead skin flakes) or urine. Pet allergy symptoms typically occur within minutes. For some people, symptoms can build up  and become most severe 8 to 12 hours after contact with the animal. People with severe allergies can experience reactions in public places if dander has been transported on the pet owners' clothing. Marland Kitchen Keeping an animal outdoors is only a partial solution, since homes with pets in the yard still have higher  concentrations of animal allergens. . Before getting a pet, ask your allergist to determine if you are allergic to animals. If your pet is already considered part of your family, try to minimize contact and keep the pet out of the bedroom and other rooms where you spend a great deal of time. . As with dust mites, vacuum carpets often or replace carpet with a hardwood floor, tile or linoleum. . High-efficiency particulate air (HEPA) cleaners can reduce allergen levels over time. . While dander and saliva are the source of cat and dog allergens, urine is the source of allergens from rabbits, hamsters, mice and [place] pigs; so ask a non-allergic family member to clean the animal's cage. . If you have a pet allergy, talk to your allergist about the potential for allergy immunotherapy (allergy shots). This strategy can often provide long-term relief. Mold Control . Mold and fungi can grow on a variety of surfaces provided certain temperature and moisture conditions exist.  . Outdoor molds grow on plants, decaying vegetation and soil. The major outdoor mold, Alternaria and Cladosporium, are found in very high numbers during hot and dry conditions. Generally, a late summer - fall peak is seen for common outdoor fungal spores. Rain will temporarily lower outdoor mold spore count, but counts rise rapidly when the rainy period ends. . The most important indoor molds are Aspergillus and Penicillium. Dark, humid and poorly ventilated basements are ideal sites for mold growth. The next most common sites of mold growth are the bathroom and the kitchen. Outdoor (Seasonal) Mold Control . Use air conditioning and keep windows closed. . Avoid exposure to decaying vegetation. Marland Kitchen Avoid leaf raking. . Avoid grain handling. . Consider wearing a face mask if working in moldy areas.  Indoor (Perennial) Mold Control  . Maintain humidity below 50%. . Get rid of mold growth on hard surfaces with water, detergent and, if  necessary, 5% bleach (do not mix with other cleaners). Then dry the area completely. If mold covers an area more than 10 square feet, consider hiring an indoor environmental professional. . For clothing, washing with soap and water is best. If moldy items cannot be cleaned and dried, throw them away. . Remove sources e.g. contaminated carpets. . Repair and seal leaking roofs or pipes. Using dehumidifiers in damp basements may be helpful, but empty the water and clean units regularly to prevent mildew from forming. All rooms, especially basements, bathrooms and kitchens, require ventilation and cleaning to deter mold and mildew growth. Avoid carpeting on concrete or damp floors, and storing items in damp areas. Cockroach Allergen Avoidance Cockroaches are often found in the homes of densely populated urban areas, schools or commercial buildings, but these creatures can lurk almost anywhere. This does not mean that you have a dirty house or living area. . Block all areas where roaches can enter the home. This includes crevices, wall cracks and windows.  . Cockroaches need water to survive, so fix and seal all leaky faucets and pipes. Have an exterminator go through the house when your family and pets are gone to eliminate any remaining roaches. Marland Kitchen Keep food in lidded containers and put pet food dishes away after your  pets are done eating. Vacuum and sweep the floor after meals, and take out garbage and recyclables. Use lidded garbage containers in the kitchen. Wash dishes immediately after use and clean under stoves, refrigerators or toasters where crumbs can accumulate. Wipe off the stove and other kitchen surfaces and cupboards regularly.

## 2018-10-25 NOTE — Progress Notes (Signed)
RE: [name] MRN: [id number] DOB: 09-01-1965 Date of Telemedicine Visit: 10/25/2018  Referring provider: [name], MD Primary care provider: [name], MD  Chief Complaint: Urticaria (doing okay) and Venom allergy (not carrying Epipens)   Telemedicine Follow Up Visit via Telephone: I connected with [name] for a follow up on 10/25/18 by telephone and verified that I am speaking with the correct person using two identifiers.   I discussed the limitations, risks, security and privacy concerns of performing an evaluation and management service by telephone and the availability of in person appointments. I also discussed with the patient that there may be a patient responsible charge related to this service. The patient expressed understanding and agreed to proceed.  Patient is at home. Provider is at the office.  Visit start time: 3:59PM Visit end time: 4:19PM Insurance consent/check in by: Dorris Fetch. Medical consent and medical assistant/nurse: Zerita Boers.  History of Present Illness: He is a 53 y.o. male, who is being followed for urticaria, allergic rhinitis, hymenoptera allergy. His previous allergy office visit was on 07/26/2018 with Dr. Maudie Mercury. Today is a regular follow up visit.  Urticaria Had 2 flares since the last visit and not sure why. No triggers noted.  Currently on zyrtec 16m BID, Pepcid 26monce a day.  He is having some issues with elevated kidney function and has nephrology appointment later this month.   Other allergic rhinitis Some increased nasal congestion. Not using Flonase.  Anaphylaxis due to hymenoptera venom No stings since the last visit. Was not carrying Epipen with him as he thought it had to be refrigerated.   Assessment and Plan: [place]Pharrells a [age].o. male with: Urticaria Past history - Rash/hives for 3-4 years but worse the last 7 months now having flat rash lasting up to 1 day. Describes them as pruritic and occurring on a  daily basis. No triggers identified. Tried benadryl, zyrtec and Claritin with no benefit. Used oral steroids with minimal benefit. Interim history - Doing well with 2 flares since last visit.  Decrease Zyrtec to 1064mnce a day.  Continue Pepcid 12m24mce a day.  If no hives for 1 month then stop Pepcid and only take zyrtec 10mg33me a day.  Take pictures when it flares.  Avoid the following potential triggers: alcohol, tight clothing, NSAIDs.  Seasonal and perennial allergic rhinoconjunctivitis Past history - Bloodwork was positive to dust mites, cat, dog, grass. Borderline positive to mold, cockroach and tree pollen. Interim history - Increased nasal congestion.   Continue zyrtec 10mg 50my.  Start Flonase 1-2 sprays in each nostril once a day.   Continue environmental control measures.   Anaphylaxis due to hymenoptera venom Past history - Anaphylactic reaction to hymenoptera in the past and apparently was on allergy immunotherapy as a child for this. Most recently got stung a few years ago which required epinephrine use. Bloodwork was borderline positive to paper wasp. Interim history - No additional stings.   Avoid insect stings.  For mild symptoms you can take over the counter antihistamines such as Benadryl and monitor symptoms closely. If symptoms worsen or if you have severe symptoms including breathing issues, throat closure, significant swelling, whole body hives, severe diarrhea and vomiting, lightheadedness then inject epinephrine and seek immediate medical care afterwards.  If interested, recommend doing skin testing in future to confirm blood work results.   Return in about 3 months (around 01/25/2019).  Meds ordered this encounter  Medications  . cetirizine (  ZYRTEC) 10 MG tablet    Sig: Take 1 tablet (10 mg total) by mouth daily.    Dispense:  30 tablet    Refill:  3  . famotidine (PEPCID) 20 MG tablet    Sig: Take 1 tablet (20 mg total) by mouth daily.     Dispense:  30 tablet    Refill:  3  . fluticasone (FLONASE) 50 MCG/ACT nasal spray    Sig: Use 1-2 sprays in each nostril once a day as needed for nasal congestion.    Dispense:  16 g    Refill:  3   Diagnostics: None.  Medication List:  Current Outpatient Medications  Medication Sig Dispense Refill  . ACCU-CHEK FASTCLIX LANCETS MISC Use to check blood sugar 3 times daily. 102 each 11  . aspirin 81 MG tablet Take 81 mg by mouth daily.    Marland Kitchen atorvastatin (LIPITOR) 10 MG tablet Take 1 tablet (10 mg total) by mouth daily. 30 tablet 11  . Blood Glucose Monitoring Suppl (ACCU-CHEK GUIDE) w/Device KIT 1 kit by Does not apply route 3 (three) times daily. Use to check blood sugar up to three times daily. 1 kit 0  . carvedilol (COREG) 25 MG tablet Take 1 tablet (25 mg total) by mouth 2 (two) times daily. 180 tablet 3  . cetirizine (ZYRTEC) 10 MG tablet Take 1 tablet (10 mg total) by mouth daily. 30 tablet 3  . cyclobenzaprine (FLEXERIL) 5 MG tablet Take 1 tablet by mouth three times daily as needed for muscle spasm 60 tablet 1  . diphenhydrAMINE (BENADRYL) 50 MG capsule Take 50 mg by mouth at bedtime.    Marland Kitchen EPINEPHrine 0.3 mg/0.3 mL IJ SOAJ injection Use as directed for severe allergic reactions. 2 Device 1  . famotidine (PEPCID) 20 MG tablet Take 1 tablet (20 mg total) by mouth daily. 30 tablet 3  . fluticasone (FLONASE) 50 MCG/ACT nasal spray Use 1-2 sprays in each nostril once a day as needed for nasal congestion. 16 g 3  . furosemide (LASIX) 80 MG tablet Take 1 tablet (80 mg total) by mouth 2 (two) times daily. 180 tablet 3  . gabapentin (NEURONTIN) 300 MG capsule TAKE 1 CAPSULE BY MOUTH THREE TIMES DAILY 90 capsule 0  . glimepiride (AMARYL) 2 MG tablet Take 0.5 tablets (1 mg total) by mouth daily before breakfast. 30 tablet 3  . glucose blood (ACCU-CHEK GUIDE) test strip Use as instructed to check blood sugar 3 times daily. 100 each 11  . Lancet Devices (ACCU-CHEK SOFTCLIX) lancets 1 each by  Other route 3 (three) times daily. 1 each 0  . levalbuterol (XOPENEX HFA) 45 MCG/ACT inhaler Inhale 2 puffs into the lungs every 6 (six) hours as needed for wheezing. 1 Inhaler 1  . pantoprazole (PROTONIX) 40 MG tablet Take 1 tablet (40 mg total) by mouth 2 (two) times daily before a meal. (Patient taking differently: Take 40 mg by mouth daily. ) 60 tablet 2  . potassium chloride SA (KLOR-CON M20) 20 MEQ tablet Take 1 tablet ( 20 meq) by mouth daily or as directed. (Patient taking differently: Take 20 mEq by mouth daily. ) 90 tablet 3  . sacubitril-valsartan (ENTRESTO) 24-26 MG Take 1 tablet by mouth 2 (two) times daily. 180 tablet 3  . sildenafil (VIAGRA) 50 MG tablet 1-2  tab 1/2-1 hr prior to intercourse.  Limit use to 2 tabs/24 hrs 20 tablet 1  . sitaGLIPtin (JANUVIA) 100 MG tablet Take 1 tablet (100 mg total) by  mouth daily. 90 tablet 3  . tamsulosin (FLOMAX) 0.4 MG CAPS capsule Take 2 capsules by mouth once daily 60 capsule 2  . terbinafine (LAMISIL AT) 1 % cream Apply 1 application topically 2 (two) times daily. 30 g 0  . triamcinolone cream (KENALOG) 0.1 % APPLY  CREAM EXTERNALLY TO AFFECTED AREA TWICE DAILY ON  TOE 30 g 0   No current facility-administered medications for this visit.    Allergies: Allergies  Allergen Reactions  . Bee Venom Anaphylaxis   I reviewed his past medical history, social history, family history, and environmental history and no significant changes have been reported from previous visit on 07/26/2018.  Review of Systems  Constitutional: Negative for appetite change, chills, fever and unexpected weight change.  HENT: Negative for congestion and rhinorrhea.   Eyes: Negative for itching.  Respiratory: Negative for cough, chest tightness, shortness of breath and wheezing.   Cardiovascular: Negative for chest pain.  Gastrointestinal: Negative for abdominal pain.  Genitourinary: Negative for difficulty urinating.  Skin: Negative for rash.  Allergic/Immunologic:  Positive for environmental allergies. Negative for food allergies.  Neurological: Negative for headaches.   Objective: Physical Exam Not obtained as encounter was done via telephone.   Previous notes and tests were reviewed.  I discussed the assessment and treatment plan with the patient. The patient was provided an opportunity to ask questions and all were answered. The patient agreed with the plan and demonstrated an understanding of the instructions. After visit summary/patient instructions available via e-mail.   The patient was advised to call back or seek an in-person evaluation if the symptoms worsen or if the condition fails to improve as anticipated.  I provided 20 minutes of non-face-to-face time during this encounter.  It was my pleasure to participate in [place] care today. Please feel free to contact me with any questions or concerns.   Sincerely,  Rexene Alberts, DO Allergy & Immunology  [facility] [place] office: [phone] [facility] office: [phone]

## 2018-10-25 NOTE — Assessment & Plan Note (Signed)
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 Most recently got stung a few years ago which required epinephrine use. Bloodwork was borderline positive to paper wasp. Interim history - No additional stings.   Avoid insect stings.  For mild symptoms you can take over the counter antihistamines such as Benadryl and monitor symptoms closely. If symptoms worsen or if you have severe symptoms including breathing issues, throat closure, significant swelling, whole body hives, severe diarrhea and vomiting, lightheadedness then inject epinephrine and seek immediate medical care afterwards.  If interested, recommend doing skin testing in future to confirm blood work results.

## 2018-10-25 NOTE — Assessment & Plan Note (Signed)
Past history - Rash/hives for 3-4 years but worse the last 7 months now having flat rash lasting up to 1 day. Describes them as pruritic and occurring on a daily basis. No triggers identified. Tried benadryl, zyrtec and Claritin with no benefit. Used oral steroids with minimal benefit. Interim history - Doing well with 2 flares since last visit.  Decrease Zyrtec to 10mg  once a day.  Continue Pepcid 20mg  once a day.  If no hives for 1 month then stop Pepcid and only take zyrtec 10mg  once a day.  Take pictures when it flares.  Avoid the following potential triggers: alcohol, tight clothing, NSAIDs.

## 2018-10-25 NOTE — Assessment & Plan Note (Signed)
Past history - Bloodwork was positive to dust mites, cat, dog, grass. Borderline positive to mold, cockroach and tree pollen. Interim history - Increased nasal congestion.   Continue zyrtec 10mg  daily.  Start Flonase 1-2 sprays in each nostril once a day.   Continue environmental control measures.

## 2018-11-02 DIAGNOSIS — N179 Acute kidney failure, unspecified: Secondary | ICD-10-CM | POA: Diagnosis not present

## 2018-11-02 DIAGNOSIS — N183 Chronic kidney disease, stage 3 (moderate): Secondary | ICD-10-CM | POA: Diagnosis not present

## 2018-11-02 DIAGNOSIS — N200 Calculus of kidney: Secondary | ICD-10-CM | POA: Diagnosis not present

## 2018-11-02 DIAGNOSIS — N400 Enlarged prostate without lower urinary tract symptoms: Secondary | ICD-10-CM | POA: Diagnosis not present

## 2018-11-02 DIAGNOSIS — N184 Chronic kidney disease, stage 4 (severe): Secondary | ICD-10-CM | POA: Diagnosis not present

## 2018-11-02 DIAGNOSIS — I428 Other cardiomyopathies: Secondary | ICD-10-CM | POA: Diagnosis not present

## 2018-11-02 DIAGNOSIS — E1122 Type 2 diabetes mellitus with diabetic chronic kidney disease: Secondary | ICD-10-CM | POA: Diagnosis not present

## 2018-11-02 DIAGNOSIS — R42 Dizziness and giddiness: Secondary | ICD-10-CM | POA: Diagnosis not present

## 2018-11-08 ENCOUNTER — Telehealth: Payer: Self-pay | Admitting: Internal Medicine

## 2018-11-08 ENCOUNTER — Other Ambulatory Visit: Payer: Self-pay | Admitting: *Deleted

## 2018-11-08 DIAGNOSIS — Z794 Long term (current) use of insulin: Secondary | ICD-10-CM

## 2018-11-08 DIAGNOSIS — K219 Gastro-esophageal reflux disease without esophagitis: Secondary | ICD-10-CM

## 2018-11-08 DIAGNOSIS — E1142 Type 2 diabetes mellitus with diabetic polyneuropathy: Secondary | ICD-10-CM

## 2018-11-08 DIAGNOSIS — E118 Type 2 diabetes mellitus with unspecified complications: Secondary | ICD-10-CM

## 2018-11-08 MED ORDER — FAMOTIDINE 20 MG PO TABS: 20.0000 mg | ORAL_TABLET | Freq: Every day | ORAL | 3 refills | Status: DC

## 2018-11-08 NOTE — Telephone Encounter (Signed)
Ebony from [facility] called requesting to speak with the patient's nurse. Charlena Cross states that she can be reached after 2pm today .   Phone: [phone] Ext. [phone]

## 2018-11-09 ENCOUNTER — Telehealth: Payer: Self-pay

## 2018-11-09 NOTE — Telephone Encounter (Signed)
[name] called because she would like a call back from the nurse, [name] stated that the [place] the patient is currently on needs to be decreased. Please follow up.

## 2018-11-09 NOTE — Telephone Encounter (Signed)
Returned Craig call and lvm making her aware that I will be forwading Dr. Wynetta Emery this message and will be giving a call back with response and if she has any questions or concerns to give me a call

## 2018-11-09 NOTE — Telephone Encounter (Signed)
Left message for patient to remind of missed remote transmission.  

## 2018-11-10 ENCOUNTER — Other Ambulatory Visit: Payer: Self-pay | Admitting: Nephrology

## 2018-11-10 DIAGNOSIS — N183 Chronic kidney disease, stage 3 unspecified: Secondary | ICD-10-CM

## 2018-11-10 MED ORDER — GABAPENTIN 300 MG PO CAPS: 300.0000 mg | ORAL_CAPSULE | Freq: Every day | ORAL | 2 refills | Status: DC

## 2018-11-10 MED ORDER — PANTOPRAZOLE SODIUM 40 MG PO TBEC: 40.0000 mg | DELAYED_RELEASE_TABLET | Freq: Every day | ORAL | 1 refills | Status: DC

## 2018-11-10 MED ORDER — SITAGLIPTIN PHOSPHATE 25 MG PO TABS: 25.0000 mg | ORAL_TABLET | Freq: Every day | ORAL | 2 refills | Status: DC

## 2018-11-10 NOTE — Telephone Encounter (Signed)
Phone call returned this morning to nurse at [place] at [facility].  I left voicemail message letting her know that I was returning her call and that I plan to contact the patient and decreased the dose of Januvia from 100mg  to 25 mg because of his kidney function.  Phone call placed to patient this a.m.  Patient reports that he had seen the nephrologist and she decrease the dose of gabapentin to 300 mg once a day, Pepcid to once a day.  I told patient that we will need to decrease the dose of Januvia to 25 mg.  I asked whether he had given any further thought to switching to insulin.  Patient said no.  Since our last conversation he has worked hard on changing his eating habits and is lost more weight intentionally.  He states that he is now 220 pounds and has changed his goal to get to 200 pounds.  He checks his blood sugars once a day and reports that the highest reading has been 123.  I told patient that I will be sending the new prescription for Januvia to his pharmacy and will update his medication list with the changes that he told me the nephrologist has made.  Advised him to keep up the good work with his dietary changes and continue to monitor blood sugars.  Message sent to our schedule to give him a f/u appt in July.

## 2018-11-15 NOTE — Progress Notes (Signed)
No ICM remote transmission received for 11/08/2018 and next ICM transmission scheduled for 12/14/2018.

## 2018-11-16 DIAGNOSIS — N183 Chronic kidney disease, stage 3 (moderate): Secondary | ICD-10-CM | POA: Diagnosis not present

## 2018-11-16 DIAGNOSIS — D631 Anemia in chronic kidney disease: Secondary | ICD-10-CM | POA: Diagnosis not present

## 2018-11-18 ENCOUNTER — Other Ambulatory Visit: Payer: Self-pay | Admitting: Nephrology

## 2018-11-18 ENCOUNTER — Other Ambulatory Visit (HOSPITAL_COMMUNITY): Payer: Self-pay | Admitting: Nephrology

## 2018-11-18 DIAGNOSIS — N183 Chronic kidney disease, stage 3 unspecified: Secondary | ICD-10-CM

## 2018-11-19 ENCOUNTER — Other Ambulatory Visit: Payer: Medicaid Other

## 2018-11-19 ENCOUNTER — Other Ambulatory Visit: Payer: Self-pay

## 2018-11-19 ENCOUNTER — Ambulatory Visit (HOSPITAL_COMMUNITY)
Admission: RE | Admit: 2018-11-19 | Discharge: 2018-11-19 | Disposition: A | Discharge status: Home / Self Care | Payer: Medicaid Other | Source: Ambulatory Visit | Attending: Nephrology | Admitting: Nephrology

## 2018-11-19 DIAGNOSIS — N183 Chronic kidney disease, stage 3 unspecified: Secondary | ICD-10-CM

## 2018-11-19 DIAGNOSIS — N189 Chronic kidney disease, unspecified: Secondary | ICD-10-CM | POA: Diagnosis not present

## 2018-11-22 DIAGNOSIS — R42 Dizziness and giddiness: Secondary | ICD-10-CM | POA: Diagnosis not present

## 2018-11-22 DIAGNOSIS — N200 Calculus of kidney: Secondary | ICD-10-CM | POA: Diagnosis not present

## 2018-11-22 DIAGNOSIS — N183 Chronic kidney disease, stage 3 (moderate): Secondary | ICD-10-CM | POA: Diagnosis not present

## 2018-11-22 DIAGNOSIS — D649 Anemia, unspecified: Secondary | ICD-10-CM | POA: Diagnosis not present

## 2018-11-22 DIAGNOSIS — N400 Enlarged prostate without lower urinary tract symptoms: Secondary | ICD-10-CM | POA: Diagnosis not present

## 2018-11-22 DIAGNOSIS — I428 Other cardiomyopathies: Secondary | ICD-10-CM | POA: Diagnosis not present

## 2018-11-22 DIAGNOSIS — N179 Acute kidney failure, unspecified: Secondary | ICD-10-CM | POA: Diagnosis not present

## 2018-11-22 DIAGNOSIS — E1122 Type 2 diabetes mellitus with diabetic chronic kidney disease: Secondary | ICD-10-CM | POA: Diagnosis not present

## 2018-11-22 DIAGNOSIS — N2581 Secondary hyperparathyroidism of renal origin: Secondary | ICD-10-CM | POA: Diagnosis not present

## 2018-11-29 ENCOUNTER — Other Ambulatory Visit: Payer: Self-pay | Admitting: Physician Assistant

## 2018-11-29 DIAGNOSIS — I428 Other cardiomyopathies: Secondary | ICD-10-CM

## 2018-11-29 DIAGNOSIS — I519 Heart disease, unspecified: Secondary | ICD-10-CM

## 2018-12-08 ENCOUNTER — Encounter: Payer: Medicaid Other | Admitting: *Deleted

## 2018-12-08 ENCOUNTER — Telehealth: Payer: Self-pay

## 2018-12-08 NOTE — Telephone Encounter (Signed)
Left message for patient to remind of missed remote transmission.  

## 2018-12-13 ENCOUNTER — Ambulatory Visit: Payer: Medicaid Other | Admitting: Podiatry

## 2018-12-13 ENCOUNTER — Other Ambulatory Visit: Payer: Self-pay

## 2018-12-13 ENCOUNTER — Encounter: Payer: Self-pay | Admitting: Podiatry

## 2018-12-13 VITALS — Temp 97.5°F

## 2018-12-13 DIAGNOSIS — M79675 Pain in left toe(s): Secondary | ICD-10-CM | POA: Diagnosis not present

## 2018-12-13 DIAGNOSIS — L308 Other specified dermatitis: Secondary | ICD-10-CM | POA: Diagnosis not present

## 2018-12-13 DIAGNOSIS — B351 Tinea unguium: Secondary | ICD-10-CM

## 2018-12-13 DIAGNOSIS — E119 Type 2 diabetes mellitus without complications: Secondary | ICD-10-CM

## 2018-12-13 DIAGNOSIS — M79674 Pain in right toe(s): Secondary | ICD-10-CM

## 2018-12-13 MED ORDER — MOMETASONE FUROATE 0.1 % EX CREA: 1.0000 "application " | TOPICAL_CREAM | Freq: Every day | CUTANEOUS | 0 refills | Status: DC

## 2018-12-13 NOTE — Patient Instructions (Signed)
Diabetes Mellitus and Foot Care Foot care is an important part of your health, especially when you have diabetes. Diabetes may cause you to have problems because of poor blood flow (circulation) to your feet and legs, which can cause your skin to:  Become thinner and drier.  Break more easily.  Heal more slowly.  Peel and crack. You may also have nerve damage (neuropathy) in your legs and feet, causing decreased feeling in them. This means that you may not notice minor injuries to your feet that could lead to more serious problems. Noticing and addressing any potential problems early is the best way to prevent future foot problems. How to care for your feet Foot hygiene  Wash your feet daily with warm water and mild soap. Do not use hot water. Then, pat your feet and the areas between your toes until they are completely dry. Do not soak your feet as this can dry your skin.  Trim your toenails straight across. Do not dig under them or around the cuticle. File the edges of your nails with an emery board or nail file.  Apply a moisturizing lotion or petroleum jelly to the skin on your feet and to dry, brittle toenails. Use lotion that does not contain alcohol and is unscented. Do not apply lotion between your toes. Shoes and socks  Wear clean socks or stockings every day. Make sure they are not too tight. Do not wear knee-high stockings since they may decrease blood flow to your legs.  Wear shoes that fit properly and have enough cushioning. Always look in your shoes before you put them on to be sure there are no objects inside.  To break in new shoes, wear them for just a few hours a day. This prevents injuries on your feet. Wounds, scrapes, corns, and calluses  Check your feet daily for blisters, cuts, bruises, sores, and redness. If you cannot see the bottom of your feet, use a mirror or ask someone for help.  Do not cut corns or calluses or try to remove them with medicine.  If you  find a minor scrape, cut, or break in the skin on your feet, keep it and the skin around it clean and dry. You may clean these areas with mild soap and water. Do not clean the area with peroxide, alcohol, or iodine.  If you have a wound, scrape, corn, or callus on your foot, look at it several times a day to make sure it is healing and not infected. Check for: ? Redness, swelling, or pain. ? Fluid or blood. ? Warmth. ? Pus or a bad smell. General instructions  Do not cross your legs. This may decrease blood flow to your feet.  Do not use heating pads or hot water bottles on your feet. They may burn your skin. If you have lost feeling in your feet or legs, you may not know this is happening until it is too late.  Protect your feet from hot and cold by wearing shoes, such as at the beach or on hot pavement.  Schedule a complete foot exam at least once a year (annually) or more often if you have foot problems. If you have foot problems, report any cuts, sores, or bruises to your health care provider immediately. Contact a health care provider if:  You have a medical condition that increases your risk of infection and you have any cuts, sores, or bruises on your feet.  You have an injury that is not   healing.  You have redness on your legs or feet.  You feel burning or tingling in your legs or feet.  You have pain or cramps in your legs and feet.  Your legs or feet are numb.  Your feet always feel cold.  You have pain around a toenail. Get help right away if:  You have a wound, scrape, corn, or callus on your foot and: ? You have pain, swelling, or redness that gets worse. ? You have fluid or blood coming from the wound, scrape, corn, or callus. ? Your wound, scrape, corn, or callus feels warm to the touch. ? You have pus or a bad smell coming from the wound, scrape, corn, or callus. ? You have a fever. ? You have a red line going up your leg. Summary  Check your feet every day  for cuts, sores, red spots, swelling, and blisters.  Moisturize feet and legs daily.  Wear shoes that fit properly and have enough cushioning.  If you have foot problems, report any cuts, sores, or bruises to your health care provider immediately.  Schedule a complete foot exam at least once a year (annually) or more often if you have foot problems. This information is not intended to replace advice given to you by your health care provider. Make sure you discuss any questions you have with your health care provider. Document Released: 05/23/2000 Document Revised: 07/08/2017 Document Reviewed: 06/27/2016 Elsevier Patient Education  2020 [employer].  

## 2018-12-14 ENCOUNTER — Ambulatory Visit (INDEPENDENT_AMBULATORY_CARE_PROVIDER_SITE_OTHER): Payer: Medicaid Other

## 2018-12-14 DIAGNOSIS — I5022 Chronic systolic (congestive) heart failure: Secondary | ICD-10-CM | POA: Diagnosis not present

## 2018-12-14 DIAGNOSIS — Z95810 Presence of automatic (implantable) cardiac defibrillator: Secondary | ICD-10-CM | POA: Diagnosis not present

## 2018-12-14 NOTE — Progress Notes (Signed)
Subjective:  [name] presents to clinic today with cc of  painful, thick, discolored, elongated toenails 1-5 b/l that become tender and cannot cut because of thickness.  Pain is aggravated when wearing enclosed shoe gear.  [name] Pier, MD is his PCP. Last visit was 10/04/2018.  He relates itching to the area of his right big toe joint today. Secondly, he is looking for a good walking shoe.  Allergies  Allergen Reactions  . Bee Venom Anaphylaxis     Objective: Vitals:   12/13/18 1609  Temp: (!) 97.5 F (36.4 C)    Physical Examination:  Vascular Examination: Capillary refill time immediate x 10 digits.  Palpable DP/PT pulses b/l.  Digital hair present b/l.  No edema noted b/l.  Skin temperature gradient WNL b/l.  Dermatological Examination: Skin with normal turgor, texture and tone b/l.  No open wounds b/l.  No interdigital macerations noted b/l.  Elongated, thick, discolored brittle toenails with subungual debris and pain on dorsal palpation of nailbeds 1-5 b/l.  Skin eruption noted dorsal 1st MPJ with hyperpigmentation and thickening. No erythema, no edema, no drainage, no flocculence, no blistering.  Musculoskeletal Examination: Muscle strength 5/5 to all muscle groups b/l.  No pain, crepitus or joint discomfort with active/passive ROM.  Neurological Examination: Sensation intact 5/5 b/l with 10 gram monofilament.  Vibratory sensation intact b/l.  Proprioceptive sensation intact b/l.  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 Toenails 1-5 b/l were debrided in length and girth without iatrogenic laceration. 2. Recommended New Balance sneaker, series 600 or higher or Brooks running shoes. 3. For eczema, a prescription was written for mometasone cream 0.1% to be applied once daily to affected area. 2.  Continue soft, supportive shoe gear daily. 3.  Report any pedal injuries to medical  professional. 4.  Follow up 3 months. 5.  Patient/POA to call should there be a question/concern in there interim.

## 2018-12-15 ENCOUNTER — Telehealth: Payer: Self-pay

## 2018-12-15 NOTE — Progress Notes (Signed)
EPIC Encounter for ICM Monitoring  Patient Name: [name] is a 53 y.o. male Date: 12/15/2018 Primary Care Physican: [name], MD Primary Cardiologist:Crenshaw Electrophysiologist: Allred 7/8/2020Weight 211 lbs  Spoke with patient.  He reports he is doing fine and intentionally losing weight following a strict diet.  No fluid symptoms during decreased impedance.    [place] thoracic impedancenormal.  Prescribed dosage:Furosemide 80 mg 1 tablet twice a day. [place] Con20 mEq 1 tablet daily.  Labs: 06/28/2018 Creatinine 1.66, BUN 18, Potassium 4.0, Sodium 142, GFR 46-54 02/26/2018 Creatinine 1.99, BUN 23, Potassium 4.3, Sodium 141, GFR 37-42 Complete set of lab results in result review.   Recommendations:  No changes. Encouraged to call for fluid symptoms.  Follow-up plan: ICM clinic phone appointment on8/03/2019.   Copy of ICM check sent to Dr.Allred.  3 month ICM trend: 12/15/2018    1 Year ICM trend:      [name], RN 12/15/2018 11:34 AM

## 2018-12-15 NOTE — Telephone Encounter (Signed)
Spoke with pt and reminded pt of remote transmission that is due today. Pt verbalized understanding. Explained a transmission has not been received since March.  He said he will send a transmission in the next hour.

## 2018-12-16 DIAGNOSIS — H40023 Open angle with borderline findings, high risk, bilateral: Secondary | ICD-10-CM | POA: Diagnosis not present

## 2018-12-16 DIAGNOSIS — H04123 Dry eye syndrome of bilateral lacrimal glands: Secondary | ICD-10-CM | POA: Diagnosis not present

## 2018-12-16 DIAGNOSIS — H2513 Age-related nuclear cataract, bilateral: Secondary | ICD-10-CM | POA: Diagnosis not present

## 2018-12-16 DIAGNOSIS — E119 Type 2 diabetes mellitus without complications: Secondary | ICD-10-CM | POA: Diagnosis not present

## 2018-12-16 LAB — HM DIABETES EYE EXAM

## 2018-12-21 ENCOUNTER — Encounter: Payer: Self-pay | Admitting: Internal Medicine

## 2018-12-21 ENCOUNTER — Other Ambulatory Visit: Payer: Self-pay

## 2018-12-21 ENCOUNTER — Other Ambulatory Visit: Payer: Self-pay | Admitting: Internal Medicine

## 2018-12-21 ENCOUNTER — Ambulatory Visit: Payer: Medicaid Other | Attending: Internal Medicine | Admitting: Internal Medicine

## 2018-12-21 VITALS — BP 103/64 | HR 80 | Temp 98.6°F | Resp 16 | Wt 218.8 lb

## 2018-12-21 DIAGNOSIS — E663 Overweight: Secondary | ICD-10-CM | POA: Diagnosis not present

## 2018-12-21 DIAGNOSIS — Z95810 Presence of automatic (implantable) cardiac defibrillator: Secondary | ICD-10-CM | POA: Insufficient documentation

## 2018-12-21 DIAGNOSIS — Z79899 Other long term (current) drug therapy: Secondary | ICD-10-CM | POA: Diagnosis not present

## 2018-12-21 DIAGNOSIS — Z87442 Personal history of urinary calculi: Secondary | ICD-10-CM | POA: Diagnosis not present

## 2018-12-21 DIAGNOSIS — Z87891 Personal history of nicotine dependence: Secondary | ICD-10-CM | POA: Insufficient documentation

## 2018-12-21 DIAGNOSIS — N184 Chronic kidney disease, stage 4 (severe): Secondary | ICD-10-CM | POA: Insufficient documentation

## 2018-12-21 DIAGNOSIS — Z8249 Family history of ischemic heart disease and other diseases of the circulatory system: Secondary | ICD-10-CM | POA: Diagnosis not present

## 2018-12-21 DIAGNOSIS — E118 Type 2 diabetes mellitus with unspecified complications: Secondary | ICD-10-CM | POA: Diagnosis not present

## 2018-12-21 DIAGNOSIS — E1142 Type 2 diabetes mellitus with diabetic polyneuropathy: Secondary | ICD-10-CM | POA: Insufficient documentation

## 2018-12-21 DIAGNOSIS — N529 Male erectile dysfunction, unspecified: Secondary | ICD-10-CM | POA: Diagnosis not present

## 2018-12-21 DIAGNOSIS — K219 Gastro-esophageal reflux disease without esophagitis: Secondary | ICD-10-CM | POA: Insufficient documentation

## 2018-12-21 DIAGNOSIS — Z7982 Long term (current) use of aspirin: Secondary | ICD-10-CM | POA: Insufficient documentation

## 2018-12-21 DIAGNOSIS — Z833 Family history of diabetes mellitus: Secondary | ICD-10-CM | POA: Diagnosis not present

## 2018-12-21 DIAGNOSIS — I428 Other cardiomyopathies: Secondary | ICD-10-CM | POA: Insufficient documentation

## 2018-12-21 DIAGNOSIS — I5022 Chronic systolic (congestive) heart failure: Secondary | ICD-10-CM | POA: Insufficient documentation

## 2018-12-21 DIAGNOSIS — E1122 Type 2 diabetes mellitus with diabetic chronic kidney disease: Secondary | ICD-10-CM | POA: Insufficient documentation

## 2018-12-21 DIAGNOSIS — Z6829 Body mass index (BMI) 29.0-29.9, adult: Secondary | ICD-10-CM | POA: Diagnosis not present

## 2018-12-21 DIAGNOSIS — Z794 Long term (current) use of insulin: Secondary | ICD-10-CM | POA: Diagnosis not present

## 2018-12-21 LAB — GLUCOSE, POCT (MANUAL RESULT ENTRY): POC Glucose: 129 mg/dl — AB (ref 70–99)

## 2018-12-21 NOTE — Progress Notes (Signed)
Patient ID: ARAM DOMZALSKI, male    DOB: Jun 09, 1966  MRN: [id number]  CC: No chief complaint on file.   Subjective: [name] is a 53 y.o. male who presents for chronic ds management. His concerns today include:  Patient with history ofNICM, systolic CHF s/p ICD placement,DMtype 2, HL,CKD stage 3-4,obesity, BPH, GERD and chronic LBP secondary to spondylosis and spinal stenosis.  CKD 3-4:  Saw nephrologist again since we last spoke 10/2018.  He tells me he was told that kidney function has remained in the stage 4 range.  He states that he will never want dialysis if things ever get to that point.  Metformin was d/c, Protonix changed to once a day dosing and Pepcid to once a day.  Nephrology also recommended decreasing Gabapentin to 300 mg once a day.  He has made these changes.  He also tells me that the nephrologist decreased the dose of Coreg because BP was low and he was having dizziness.  He does not recall what it was dec to.  He was previously on 25 mg BID and this is the dose that is currently on med list.  DM:  Pt tells me he has made major changes in his diet.  He is on a 1500 cal per day.  He eats an apple in the morning, has no lunch and eats fish with salad in the evenings for dinner.  He walks/run b/w 4-7 miles a day.   Checking BS once a day in mornings.  Gives range of 100-140.  Only one time BS in the 170s.   Currently only taking the low dose of Januvia.  Not taking Amaryl  Impotence: started having numbness to LT groin and impotence after he had LT  inguinal hernia repair last yr. Reports being told by surgeon that he may had some nerve damage which should get better in several mths but it has not.  He referred him to urology but he has not received call as yet.  He would like to be referred.   I tried him with Viagra which he said did not help.  However, on further questioning, pt reports that he took only 1/2 tab of the 50 mg tablet.  It caused a feeling of nasal  congestion so he did not try  It again  -endorses that he has sexual desire but unable to get erection  Patient Active Problem List   Diagnosis Date Noted  . Seasonal and perennial allergic rhinoconjunctivitis 10/25/2018  . 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 Other allergic rhinitis 06/28/2018  . Urticaria 05/03/2018  . S/P inguinal hernia repair 03/01/2018  . Unilateral inguinal hernia without obstruction or gangrene 07/24/2017  . 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  . Hypersomnia 02/28/2016  . Abnormal PFT 02/24/2016  . Diabetic polyneuropathy associated with type 2 diabetes mellitus ([place]) 12/18/2015  . Lumbar back pain 12/18/2015  . Controlled type 2 diabetes mellitus with complication, with long-term current use of insulin ([place]) 09/17/2015  . 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 Lesion of right median nerve at forearm 04/16/2015  . ICD (implantable cardioverter-defibrillator) in place 03/28/2014  . Chronic systolic congestive heart failure ([place]) 03/28/2014  . Ventricular tachycardia, non-sustained ([place]) 03/27/2014  . Nonischemic cardiomyopathy ([place]) 11/09/2013  . Syncope 10/20/2013  . Chronic chest pain 07/28/2013  . RENAL CALCULUS 05/13/2009  . Obesity 05/08/2009  . Former heavy cigarette smoker (20-39 per day) 05/08/2009  . HOARSENESS 05/08/2009  . ANXIETY  02/28/2008  . GERD 02/28/2008  . IRRITABLE BOWEL SYNDROME 02/28/2008     Current Outpatient Medications on File Prior to Visit  Medication Sig Dispense Refill  . ACCU-CHEK FASTCLIX LANCETS MISC Use to check blood sugar 3 times daily. 102 each 11  . aspirin 81 MG tablet Take 81 mg by mouth daily.    Marland Kitchen atorvastatin (LIPITOR) 10 MG tablet Take 1 tablet (10 mg total) by mouth daily. 30 tablet 11  . Blood Glucose Monitoring Suppl (ACCU-CHEK GUIDE) w/Device KIT 1 kit by Does not apply route 3 (three) times daily. Use to check blood sugar up to three times daily. 1 kit 0  .  carvedilol (COREG) 25 MG tablet Take 1 tablet (25 mg total) by mouth 2 (two) times daily. 180 tablet 3  . cetirizine (ZYRTEC) 10 MG tablet Take 1 tablet (10 mg total) by mouth daily. 30 tablet 3  . diphenhydrAMINE (BENADRYL) 50 MG capsule Take 50 mg by mouth at bedtime.    Marland Kitchen ENTRESTO 24-26 MG Take 1 tablet by mouth twice daily 60 tablet 0  . EPINEPHrine 0.3 mg/0.3 mL IJ SOAJ injection Use as directed for severe allergic reactions. 2 Device 1  . famotidine (PEPCID) 20 MG tablet Take 1 tablet (20 mg total) by mouth daily. 30 tablet 3  . fluticasone (FLONASE) 50 MCG/ACT nasal spray Use 1-2 sprays in each nostril once a day as needed for nasal congestion. 16 g 3  . furosemide (LASIX) 80 MG tablet Take 1 tablet (80 mg total) by mouth 2 (two) times daily. 180 tablet 3  . gabapentin (NEURONTIN) 300 MG capsule Take 1 capsule (300 mg total) by mouth at bedtime. 90 capsule 2  . glimepiride (AMARYL) 2 MG tablet Take 0.5 tablets (1 mg total) by mouth daily before breakfast. 30 tablet 3  . glucose blood (ACCU-CHEK GUIDE) test strip Use as instructed to check blood sugar 3 times daily. 100 each 11  . Lancet Devices (ACCU-CHEK SOFTCLIX) lancets 1 each by Other route 3 (three) times daily. 1 each 0  . levalbuterol (XOPENEX HFA) 45 MCG/ACT inhaler Inhale 2 puffs into the lungs every 6 (six) hours as needed for wheezing. 1 Inhaler 1  . metFORMIN (GLUCOPHAGE) 1000 MG tablet TAKE ONE TABLET BY MOUTH IN THE MORNING AND TAKE ONE AND ONE HALF IN THE EVENING    . mometasone (ELOCON) 0.1 % cream Apply 1 application topically daily. 45 g 0  . pantoprazole (PROTONIX) 40 MG tablet Take 1 tablet (40 mg total) by mouth daily. 30 tablet 1  . potassium chloride SA (KLOR-CON M20) 20 MEQ tablet Take 1 tablet ( 20 meq) by mouth daily or as directed. (Patient taking differently: Take 20 mEq by mouth daily. ) 90 tablet 3  . sildenafil (VIAGRA) 50 MG tablet 1-2  tab 1/2-1 hr prior to intercourse.  Limit use to 2 tabs/24 hrs 20 tablet 1   . sitaGLIPtin (JANUVIA) 25 MG tablet Take 1 tablet (25 mg total) by mouth daily. 90 tablet 2  . tamsulosin (FLOMAX) 0.4 MG CAPS capsule Take 2 capsules by mouth once daily 60 capsule 2  . terbinafine (LAMISIL AT) 1 % cream Apply 1 application topically 2 (two) times daily. 30 g 0  . triamcinolone cream (KENALOG) 0.1 % APPLY  CREAM EXTERNALLY TO AFFECTED AREA TWICE DAILY ON  TOE 30 g 0   No current facility-administered medications on file prior to visit.     Allergies  Allergen Reactions  . Bee Venom Anaphylaxis  Social History   Socioeconomic History  . Marital status: Single    Spouse name: Not on file  . Number of children: Not on file  . Years of education: Not on file  . Highest education level: Not on file  Occupational History  . Occupation: Disabled  Social Needs  . Financial resource strain: Not on file  . Food insecurity    Worry: Not on file    Inability: Not on file  . Transportation needs    Medical: Not on file    Non-medical: Not on file  Tobacco Use  . Smoking status: Former Smoker    Packs/day: 1.00    Years: 28.00    Pack years: 28.00    Types: Cigarettes    Quit date: 06/03/2014    Years since quitting: 4.5  . Smokeless tobacco: Never Used  Substance and Sexual Activity  . Alcohol use: Yes    Alcohol/week: 0.0 standard drinks    Comment: 12/23/2013 "might drink a couple times/yr"  . Drug use: Yes    Types: Marijuana    Comment: 02/25/18- patient  states has not used marjuana  . Sexual activity: Yes  Lifestyle  . Physical activity    Days per week: Not on file    Minutes per session: Not on file  . Stress: Not on file  Relationships  . Social Herbalist on phone: Not on file    Gets together: Not on file    Attends religious service: Not on file    Active member of club or organization: Not on file    Attends meetings of clubs or organizations: Not on file    Relationship status: Not on file  . Intimate partner violence     Fear of current or ex partner: Not on file    Emotionally abused: Not on file    Physically abused: Not on file    Forced sexual activity: Not on file  Other Topics Concern  . Not on file  Social History Narrative   Pt lives with his brother     Family History  Problem Relation Age of Onset  . Diabetes Father   . Arthritis Father   . Hypertension Father   . Diabetes Mother   . Arthritis Mother   . Hypertension Mother   . Hypertension Brother     Past Surgical History:  Procedure Laterality Date  . CARDIAC CATHETERIZATION  08/10/13  . CHOLECYSTECTOMY  ~ 2010  . IMPLANTABLE CARDIOVERTER DEFIBRILLATOR IMPLANT  12/23/2013   STJ Fortify ICD implanted by [name] for cardiomyopathy and syncope  . IMPLANTABLE CARDIOVERTER DEFIBRILLATOR IMPLANT N/A 12/23/2013   Procedure: IMPLANTABLE CARDIOVERTER DEFIBRILLATOR IMPLANT;  Surgeon: Coralyn Mark, MD;  Location: [facility] CATH LAB;  Service: Cardiovascular;  Laterality: N/A;  . INGUINAL HERNIA REPAIR Left 03/01/2018   Procedure: OPEN REPAIR OF LEFT INGUINAL HERNIA WITH MESH;  Surgeon: Greer Pickerel, MD;  Location: [facility];  Service: General;  Laterality: Left;  . LEFT HEART CATHETERIZATION WITH CORONARY ANGIOGRAM N/A 08/10/2013   Procedure: LEFT HEART CATHETERIZATION WITH CORONARY ANGIOGRAM;  Surgeon: Jettie Booze, MD;  Location: [facility] C[facility];  Service: Cardiovascular;  Laterality: N/A;  . ORIF HUMERUS FRACTURE Left 08/12/2013   Procedure: OPEN REDUCTION INTERNAL FIXATION (ORIF) HUMERAL SHAFT FRACTURE;  Surgeon: Newt Minion, MD;  Location: [facility];  Service: Orthopedics;  Laterality: Left;  Open Reduction Internal Fixation Left Humerus  . URETEROSCOPY     "laser for kidney stones"  ROS: Review of Systems Negative except as stated above  PHYSICAL EXAM: BP 103/64   Pulse 80   Temp 98.6 F (37 C) (Oral)   Resp 16   Wt 218 lb 12.8 oz (99.2 kg)   SpO2 97%   BMI 29.67 kg/m   Physical Exam  General appearance - alert, well appearing,  middle age AAM and in no distress Mental status - normal mood, behavior, speech, dress, motor activity, and thought processes Neck - supple, no significant adenopathy Chest - clear to auscultation, no wheezes, rales or rhonchi, symmetric air entry Heart - regular rate and rhythm Extremities - no LE edema  CMP Latest Ref Rng & Units 10/13/2018 06/28/2018 02/26/2018  Glucose 65 - 99 mg/dL 118(H) 168(H) 175(H)  BUN 6 - 24 mg/dL 38(H) 18 23(H)  Creatinine 0.76 - 1.27 mg/dL 2.75(H) 1.66(H) 1.99(H)  Sodium 134 - 144 mmol/L 142 142 141  Potassium 3.5 - 5.2 mmol/L 4.4 4.0 4.3  Chloride 96 - 106 mmol/L 103 102 100  CO2 20 - 29 mmol/L 24 24 30   Calcium 8.7 - 10.2 mg/dL 9.5 9.3 9.3  Total Protein 6.0 - 8.5 g/dL - 7.0 -  Total Bilirubin 0.0 - 1.2 mg/dL - 0.4 -  Alkaline Phos 39 - 117 IU/L - 50 -  AST 0 - 40 IU/L - 14 -  ALT 0 - 44 IU/L - 12 -   Lipid Panel     Component Value Date/Time   CHOL 205 (H) 10/13/2018 1415   TRIG 192 (H) 10/13/2018 1415   HDL 36 (L) 10/13/2018 1415   CHOLHDL 5.7 (H) 10/13/2018 1415   CHOLHDL 4 05/08/2009 1040   VLDL 27.0 05/08/2009 1040   LDLCALC 131 (H) 10/13/2018 1415    CBC    Component Value Date/Time   WBC 4.5 06/28/2018 1440   WBC 4.7 02/26/2018 1134   RBC 4.17 06/28/2018 1440   RBC 4.06 (L) 02/26/2018 1134   HGB 12.5 (L) 06/28/2018 1440   HCT 36.9 (L) 06/28/2018 1440   PLT 301 06/28/2018 1440   MCV 89 06/28/2018 1440   MCH 30.0 06/28/2018 1440   MCH 30.0 02/26/2018 1134   MCHC 33.9 06/28/2018 1440   MCHC 33.3 02/26/2018 1134   RDW 13.4 06/28/2018 1440   LYMPHSABS 1.7 06/28/2018 1440   MONOABS 0.6 09/21/2017 2158   EOSABS 0.3 06/28/2018 1440   BASOSABS 0.0 06/28/2018 1440    ASSESSMENT AND PLAN: 1. Controlled type 2 diabetes mellitus with complication, with long-term current use of insulin ([place]) -commended him on dietary changes and regular exercise. However, I warned against his diet being too restrictive.  Encourage food variety - POCT  glucose (manual entry)  2. CKD (chronic kidney disease) stage 4, GFR 15-29 ml/min (H[facility]) Followed by nephrology  3. Impotence Went over how Viagra works and possible S.E.  Informed that it can cause feeling of stuffy nose, HA, flushing due to dilation of blood vessels.  Recommend trying the 50 mg dose.  Be seen in ER if he has erection lasting longer than 2-3 hrs, or sudden changes in vision/hearing - Ambulatory referral to Urology - Testosterone; Future  4. Over weight See #1 above     Patient was given the opportunity to ask questions.  Patient verbalized understanding of the plan and was able to repeat key elements of the plan.   No orders of the defined types were placed in this encounter.    Requested Prescriptions    No prescriptions requested or  ordered in this encounter    No follow-ups on file.  Karle Plumber, MD, FACP

## 2018-12-21 NOTE — Patient Instructions (Signed)
I have submitted the referral for you to see the urologist. Try taking the 50 mg of Viagra as needed.  Make sure to maintain some variety in your foods.

## 2018-12-22 ENCOUNTER — Encounter: Payer: Self-pay | Admitting: Internal Medicine

## 2018-12-22 DIAGNOSIS — H40009 Preglaucoma, unspecified, unspecified eye: Secondary | ICD-10-CM | POA: Insufficient documentation

## 2018-12-28 ENCOUNTER — Other Ambulatory Visit: Payer: Self-pay | Admitting: Internal Medicine

## 2018-12-28 DIAGNOSIS — N138 Other obstructive and reflux uropathy: Secondary | ICD-10-CM

## 2018-12-28 DIAGNOSIS — N401 Enlarged prostate with lower urinary tract symptoms: Secondary | ICD-10-CM

## 2019-01-02 ENCOUNTER — Other Ambulatory Visit: Payer: Self-pay | Admitting: Physician Assistant

## 2019-01-02 DIAGNOSIS — I428 Other cardiomyopathies: Secondary | ICD-10-CM

## 2019-01-02 DIAGNOSIS — I519 Heart disease, unspecified: Secondary | ICD-10-CM

## 2019-01-03 ENCOUNTER — Other Ambulatory Visit: Payer: Self-pay

## 2019-01-03 DIAGNOSIS — I428 Other cardiomyopathies: Secondary | ICD-10-CM

## 2019-01-03 DIAGNOSIS — I519 Heart disease, unspecified: Secondary | ICD-10-CM

## 2019-01-03 MED ORDER — ENTRESTO 24-26 MG PO TABS: 1.0000 | ORAL_TABLET | Freq: Two times a day (BID) | ORAL | 0 refills | Status: DC

## 2019-01-04 ENCOUNTER — Telehealth: Payer: Self-pay | Admitting: *Deleted

## 2019-01-04 NOTE — Telephone Encounter (Signed)
Refill

## 2019-01-18 ENCOUNTER — Encounter: Payer: Medicaid Other | Admitting: *Deleted

## 2019-01-19 DIAGNOSIS — N5201 Erectile dysfunction due to arterial insufficiency: Secondary | ICD-10-CM | POA: Diagnosis not present

## 2019-01-19 DIAGNOSIS — N400 Enlarged prostate without lower urinary tract symptoms: Secondary | ICD-10-CM | POA: Diagnosis not present

## 2019-01-20 DIAGNOSIS — H2513 Age-related nuclear cataract, bilateral: Secondary | ICD-10-CM | POA: Diagnosis not present

## 2019-01-20 DIAGNOSIS — H04123 Dry eye syndrome of bilateral lacrimal glands: Secondary | ICD-10-CM | POA: Diagnosis not present

## 2019-01-20 DIAGNOSIS — H40023 Open angle with borderline findings, high risk, bilateral: Secondary | ICD-10-CM | POA: Diagnosis not present

## 2019-01-26 ENCOUNTER — Encounter: Payer: Self-pay | Admitting: Cardiology

## 2019-01-26 ENCOUNTER — Other Ambulatory Visit: Payer: Self-pay | Admitting: Internal Medicine

## 2019-01-31 ENCOUNTER — Other Ambulatory Visit: Payer: Self-pay | Admitting: Physician Assistant

## 2019-01-31 DIAGNOSIS — I428 Other cardiomyopathies: Secondary | ICD-10-CM

## 2019-01-31 DIAGNOSIS — I519 Heart disease, unspecified: Secondary | ICD-10-CM

## 2019-01-31 DIAGNOSIS — I5042 Chronic combined systolic (congestive) and diastolic (congestive) heart failure: Secondary | ICD-10-CM

## 2019-02-03 ENCOUNTER — Ambulatory Visit (INDEPENDENT_AMBULATORY_CARE_PROVIDER_SITE_OTHER): Payer: Medicaid Other | Admitting: *Deleted

## 2019-02-03 DIAGNOSIS — I5022 Chronic systolic (congestive) heart failure: Secondary | ICD-10-CM

## 2019-02-03 DIAGNOSIS — I428 Other cardiomyopathies: Secondary | ICD-10-CM

## 2019-02-04 LAB — CUP PACEART REMOTE DEVICE CHECK
Battery Remaining Longevity: 61 mo
Battery Remaining Percentage: 58 %
Battery Voltage: 2.95 V
Brady Statistic RV Percent Paced: 1 %
Date Time Interrogation Session: 20200827145325
HighPow Impedance: 64 Ohm
HighPow Impedance: 64 Ohm
Implantable Lead Implant Date: 20150717
Implantable Lead Location: 753860
Implantable Pulse Generator Implant Date: 20150717
Lead Channel Impedance Value: 310 Ohm
Lead Channel Sensing Intrinsic Amplitude: 11.8 mV
Lead Channel Setting Pacing Amplitude: 2.5 V
Lead Channel Setting Pacing Pulse Width: 0.5 ms
Lead Channel Setting Sensing Sensitivity: 0.5 mV
Pulse Gen Serial Number: 7196009

## 2019-02-07 NOTE — Progress Notes (Signed)
HPI: FU CM/CHF. Echo 2/15 revealed EF of 20-25% with diffuse hypokinesis and biatrial enlargement, mild MR and a small pericardial effusion. TSH in February 2015 normal. Cardiac catheterization in March of 2015 showed normal coronary arteries. Also with prior ICD. Echocardiogram August 2017 showed moderate left ventricular hypertrophy, moderate left ventricular enlargement, ejection fraction 84%, grade 1 diastolic dysfunction and moderate left atrial enlargement. CPX August 2017 showed submaximal effort with moderate functional impairment primarily circulatory limited. Since last seen,he denies dyspnea on exertion.  No orthopnea, PND or pedal edema.  He continues to have occasional chest pain that is not exertional and can last 1 second at a time.  He does have some dizziness with standing at times.  No syncope.  Current Outpatient Medications  Medication Sig Dispense Refill  . ACCU-CHEK FASTCLIX LANCETS MISC Use to check blood sugar 3 times daily. 102 each 11  . aspirin 81 MG tablet Take 81 mg by mouth daily.    Marland Kitchen atorvastatin (LIPITOR) 10 MG tablet Take 1 tablet (10 mg total) by mouth daily. 30 tablet 11  . Blood Glucose Monitoring Suppl (ACCU-CHEK GUIDE) w/Device KIT 1 kit by Does not apply route 3 (three) times daily. Use to check blood sugar up to three times daily. 1 kit 0  . carvedilol (COREG) 25 MG tablet Take 1 tablet (25 mg total) by mouth 2 (two) times daily. (Patient taking differently: Take 12.5 mg by mouth 2 (two) times daily. ) 180 tablet 3  . cetirizine (ZYRTEC) 10 MG tablet Take 1 tablet (10 mg total) by mouth daily. 30 tablet 3  . cyclobenzaprine (FLEXERIL) 5 MG tablet Take 1 tablet by mouth three times daily as needed for muscle spasm 60 tablet 0  . ENTRESTO 24-26 MG Take 1 tablet by mouth twice daily 60 tablet 0  . EPINEPHrine 0.3 mg/0.3 mL IJ SOAJ injection Use as directed for severe allergic reactions. 2 Device 1  . fluticasone (FLONASE) 50 MCG/ACT nasal spray Use 1-2  sprays in each nostril once a day as needed for nasal congestion. 16 g 3  . furosemide (LASIX) 80 MG tablet Take 1 tablet by mouth twice daily 60 tablet 0  . gabapentin (NEURONTIN) 300 MG capsule Take 1 capsule (300 mg total) by mouth at bedtime. 90 capsule 2  . glucose blood (ACCU-CHEK GUIDE) test strip Use as instructed to check blood sugar 3 times daily. 100 each 11  . Lancet Devices (ACCU-CHEK SOFTCLIX) lancets 1 each by Other route 3 (three) times daily. 1 each 0  . levalbuterol (XOPENEX HFA) 45 MCG/ACT inhaler Inhale 2 puffs into the lungs every 6 (six) hours as needed for wheezing. 1 Inhaler 1  . mometasone (ELOCON) 0.1 % cream Apply 1 application topically daily. 45 g 0  . sildenafil (VIAGRA) 50 MG tablet 1-2  tab 1/2-1 hr prior to intercourse.  Limit use to 2 tabs/24 hrs 20 tablet 1  . sitaGLIPtin (JANUVIA) 25 MG tablet Take 1 tablet (25 mg total) by mouth daily. 90 tablet 2  . tamsulosin (FLOMAX) 0.4 MG CAPS capsule Take 2 capsules by mouth once daily 60 capsule 2  . terbinafine (LAMISIL AT) 1 % cream Apply 1 application topically 2 (two) times daily. 30 g 0  . triamcinolone cream (KENALOG) 0.1 % APPLY  CREAM EXTERNALLY TO AFFECTED AREA TWICE DAILY ON  TOE 30 g 0   No current facility-administered medications for this visit.      Past Medical History:  Diagnosis Date  .  AICD (automatic cardioverter/defibrillator) present   . Chronic bronchitis ([place])    "get it most q yr" (12/23/2013)  . Chronic systolic (congestive) heart failure ([place])   . Fracture of left humerus    a. 07/2013.  Marland Kitchen GERD (gastroesophageal reflux disease)    not at present time  . Heart murmur    "born w/it"   . History of renal calculi   . Hypertension   . Kidney stones   . NICM (nonischemic cardiomyopathy) ([place])    a. 07/2013 Echo: EF 20-25%, diff HK, Gr2 DD, mild MR, mod dil LA/RA. EF 40% 2017 echo  . Obesity   . 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 Syncope   . Type II diabetes mellitus ([place]) 2006    Past Surgical History:  Procedure Laterality Date  . CARDIAC CATHETERIZATION  08/10/13  . CHOLECYSTECTOMY  ~ 2010  . IMPLANTABLE CARDIOVERTER DEFIBRILLATOR IMPLANT  12/23/2013   STJ Fortify ICD implanted by [name] for cardiomyopathy and syncope  . IMPLANTABLE CARDIOVERTER DEFIBRILLATOR IMPLANT N/A 12/23/2013   Procedure: IMPLANTABLE CARDIOVERTER DEFIBRILLATOR IMPLANT;  Surgeon: Coralyn Mark, MD;  Location: [facility] CATH LAB;  Service: Cardiovascular;  Laterality: N/A;  . INGUINAL HERNIA REPAIR Left 03/01/2018   Procedure: OPEN REPAIR OF LEFT INGUINAL HERNIA WITH MESH;  Surgeon: Greer Pickerel, MD;  Location: [facility];  Service: General;  Laterality: Left;  . LEFT HEART CATHETERIZATION WITH CORONARY ANGIOGRAM N/A 08/10/2013   Procedure: LEFT HEART CATHETERIZATION WITH CORONARY ANGIOGRAM;  Surgeon: Jettie Booze, MD;  Location: [facility] CATH LAB;  Service: Cardiovascular;  Laterality: N/A;  . ORIF HUMERUS FRACTURE Left 08/12/2013   Procedure: OPEN REDUCTION INTERNAL FIXATION (ORIF) HUMERAL SHAFT FRACTURE;  Surgeon: Newt Minion, MD;  Location: [facility];  Service: Orthopedics;  Laterality: Left;  Open Reduction Internal Fixation Left Humerus  . URETEROSCOPY     "laser for kidney stones"    Social History   Socioeconomic History  . Marital status: Single    Spouse name: Not on file  . Number of children: Not on file  . Years of education: Not on file  . Highest education level: Not on file  Occupational History  . Occupation: Disabled  Social Needs  . Financial resource strain: Not on file  . Food insecurity    Worry: Not on file    Inability: Not on file  . Transportation needs    Medical: Not on file    Non-medical: Not on file  Tobacco Use  . Smoking status: Former Smoker    Packs/day: 1.00    Years: 28.00    Pack years: 28.00    Types: Cigarettes    Quit date:  06/03/2014    Years since quitting: 4.6  . Smokeless tobacco: Never Used  Substance and Sexual Activity  . Alcohol use: Yes    Alcohol/week: 0.0 standard drinks    Comment: 12/23/2013 "might drink a couple times/yr"  . Drug use: Yes    Types: Marijuana    Comment: 02/25/18- patient  states has not used marjuana  . Sexual activity: Yes  Lifestyle  . Physical activity    Days per week: Not on file    Minutes per session: Not on file  . Stress: Not on file  Relationships  . Social Herbalist on phone: Not on file    Gets together: Not on file  Attends religious service: Not on file    Active member of club or organization: Not on file    Attends meetings of clubs or organizations: Not on file    Relationship status: Not on file  . Intimate partner violence    Fear of current or ex partner: Not on file    Emotionally abused: Not on file    Physically abused: Not on file    Forced sexual activity: Not on file  Other Topics Concern  . Not on file  Social History Narrative   Pt lives with his brother     Family History  Problem Relation Age of Onset  . Diabetes Father   . Arthritis Father   . Hypertension Father   . Diabetes Mother   . Arthritis Mother   . Hypertension Mother   . Hypertension Brother     ROS: no fevers or chills, productive cough, hemoptysis, dysphasia, odynophagia, melena, hematochezia, dysuria, hematuria, rash, seizure activity, orthopnea, PND, pedal edema, claudication. Remaining systems are negative.  Physical Exam: Well-developed well-nourished in no acute distress.  Skin is warm and dry.  HEENT is normal.  Neck is supple.  Chest is clear to auscultation with normal expansion.  Cardiovascular exam is regular rate and rhythm.  Abdominal exam nontender or distended. No masses palpated. Extremities show no edema. neuro grossly intact  ECG-sinus rhythm at a rate of 60, cannot rule out prior septal infarct.  Personally reviewed  A/P   1 chronic systolic congestive heart failure-patient is not volume overloaded and recent renal function was worse.  Decrease Lasix to 40 mg twice daily.  Take an additional 40 mg for worsening edema or weight gain of 2 to 3 pounds.  Continue fluid restriction and low-sodium diet.  He is scheduled to see nephrology in 1 to 2 weeks and his renal function will be rechecked then.  2 NICM-continue coreg (BP borderline; decrease to 6.25 mg BID) and entresto. Repeat echocardiogram.  3 chronic CP-as outlined previously, cath showed no significant CAD and symptoms are chronic; no further ischemia eval.  4 prior ICD-followed by EP.  5 chronic stage 3 kidney disease-followed by nephrology.  [name], MD

## 2019-02-08 ENCOUNTER — Other Ambulatory Visit: Payer: Self-pay

## 2019-02-08 ENCOUNTER — Encounter: Payer: Self-pay | Admitting: Cardiology

## 2019-02-08 ENCOUNTER — Ambulatory Visit (INDEPENDENT_AMBULATORY_CARE_PROVIDER_SITE_OTHER): Payer: Medicaid Other | Admitting: Cardiology

## 2019-02-08 VITALS — BP 90/60 | HR 60 | Temp 97.5°F | Ht 72.0 in | Wt 211.0 lb

## 2019-02-08 DIAGNOSIS — I5042 Chronic combined systolic (congestive) and diastolic (congestive) heart failure: Secondary | ICD-10-CM | POA: Diagnosis not present

## 2019-02-08 DIAGNOSIS — I428 Other cardiomyopathies: Secondary | ICD-10-CM

## 2019-02-08 DIAGNOSIS — I10 Essential (primary) hypertension: Secondary | ICD-10-CM | POA: Diagnosis not present

## 2019-02-08 MED ORDER — FUROSEMIDE 80 MG PO TABS: 40.0000 mg | ORAL_TABLET | Freq: Two times a day (BID) | ORAL | 3 refills | Status: DC

## 2019-02-08 MED ORDER — CARVEDILOL 6.25 MG PO TABS: 6.2500 mg | ORAL_TABLET | Freq: Two times a day (BID) | ORAL | 3 refills | Status: DC

## 2019-02-08 NOTE — Patient Instructions (Signed)
Medication Instruction DECREASE CARVEDILOL TO 6.25 MG TWICE DAILY  DECREASE FUROSEMIDE TO 40 MG TWICE DAILY=1/2 OF THE 80 MG TABLET TWICE DAILY-MAY TAKE EXTRA 1/2 AS NEEDED FOR SWELLING OR WEIGHT GAIN  If you need a refill on your cardiac medications before your next appointment, please call your pharmacy.   Lab work: If you have labs (blood work) drawn today and your tests are completely normal, you will receive your results only by: Marland Kitchen MyChart Message (if you have MyChart) OR . A paper copy in the mail If you have any lab test that is abnormal or we need to change your treatment, we will call you to review the results.  Testing/Procedures: Your physician has requested that you have an echocardiogram. Echocardiography is a painless test that uses sound waves to create images of your heart. It provides your doctor with information about the size and shape of your heart and how well your heart's chambers and valves are working. This procedure takes approximately one hour. There are no restrictions for this procedure.[place]    Follow-Up: At [facility], you and your health needs are our priority.  As part of our continuing mission to provide you with exceptional heart care, we have created designated Provider Care Teams.  These Care Teams include your primary Cardiologist (physician) and Advanced Practice Providers (APPs -  Physician Assistants and Nurse Practitioners) who all work together to provide you with the care you need, when you need it. You will need a follow up appointment in 6 months.  Please call our office 2 months in advance to schedule this appointment.  You may see [name], MD or one of the following Advanced Practice Providers on your designated Care Team:   Kerin Ransom, PA-C Roby Lofts, [place] . Sande Rives, PA-C

## 2019-02-10 NOTE — Progress Notes (Signed)
Remote ICD transmission.   

## 2019-02-15 NOTE — Progress Notes (Signed)
No ICM remote transmission received for 02/07/2019 and next ICM transmission scheduled for 03/16/2019.   

## 2019-02-16 ENCOUNTER — Other Ambulatory Visit: Payer: Self-pay

## 2019-02-16 ENCOUNTER — Ambulatory Visit (HOSPITAL_COMMUNITY): Payer: Medicaid Other | Attending: Cardiology

## 2019-02-16 DIAGNOSIS — I428 Other cardiomyopathies: Secondary | ICD-10-CM | POA: Insufficient documentation

## 2019-02-17 ENCOUNTER — Encounter (HOSPITAL_BASED_OUTPATIENT_CLINIC_OR_DEPARTMENT_OTHER): Payer: Self-pay | Admitting: Emergency Medicine

## 2019-02-17 ENCOUNTER — Other Ambulatory Visit: Payer: Self-pay

## 2019-02-17 ENCOUNTER — Emergency Department (HOSPITAL_BASED_OUTPATIENT_CLINIC_OR_DEPARTMENT_OTHER): Payer: Medicaid Other

## 2019-02-17 ENCOUNTER — Emergency Department (HOSPITAL_BASED_OUTPATIENT_CLINIC_OR_DEPARTMENT_OTHER)
Admission: EM | Admit: 2019-02-17 | Discharge: 2019-02-17 | Disposition: A | Discharge status: Home / Self Care | Payer: Medicaid Other | Attending: Emergency Medicine | Admitting: Emergency Medicine

## 2019-02-17 DIAGNOSIS — I110 Hypertensive heart disease with heart failure: Secondary | ICD-10-CM | POA: Diagnosis not present

## 2019-02-17 DIAGNOSIS — M79631 Pain in right forearm: Secondary | ICD-10-CM | POA: Insufficient documentation

## 2019-02-17 DIAGNOSIS — Z79899 Other long term (current) drug therapy: Secondary | ICD-10-CM | POA: Insufficient documentation

## 2019-02-17 DIAGNOSIS — Z7982 Long term (current) use of aspirin: Secondary | ICD-10-CM | POA: Insufficient documentation

## 2019-02-17 DIAGNOSIS — I5022 Chronic systolic (congestive) heart failure: Secondary | ICD-10-CM | POA: Diagnosis not present

## 2019-02-17 DIAGNOSIS — Z7984 Long term (current) use of oral hypoglycemic drugs: Secondary | ICD-10-CM | POA: Insufficient documentation

## 2019-02-17 DIAGNOSIS — Z95810 Presence of automatic (implantable) cardiac defibrillator: Secondary | ICD-10-CM | POA: Insufficient documentation

## 2019-02-17 DIAGNOSIS — E119 Type 2 diabetes mellitus without complications: Secondary | ICD-10-CM | POA: Insufficient documentation

## 2019-02-17 DIAGNOSIS — Z87891 Personal history of nicotine dependence: Secondary | ICD-10-CM | POA: Insufficient documentation

## 2019-02-17 MED ORDER — CYCLOBENZAPRINE HCL 10 MG PO TABS: 10.0000 mg | ORAL_TABLET | Freq: Two times a day (BID) | ORAL | 0 refills | Status: DC | PRN

## 2019-02-17 MED ORDER — ACETAMINOPHEN 500 MG PO TABS
1000.0000 mg | ORAL_TABLET | Freq: Once | ORAL | Status: AC
  Administered 2019-02-17: 21:00:00 1000 mg via ORAL
  Filled 2019-02-17: qty 2

## 2019-02-17 NOTE — ED Triage Notes (Signed)
Pt c/o pain to right forearm with no known injury x 1 week.

## 2019-02-17 NOTE — ED Provider Notes (Signed)
[facility] EMERGENCY DEPARTMENT Provider Note   CSN: [id number] Arrival date & time: 02/17/19  2001     History   Chief Complaint Chief Complaint  Patient presents with  . Arm Pain    HPI [name] is a 53 y.o. male.     HPI  53 year old male with a history of nonischemic, cardiomyopathy, defibrillator in place, diabetes presents with concern for right forearm pain.  Reports of the pain is been present for approximately 1 week.  It is located on the ulnar side of his forearm and at times shoots up to the right elbow.  Denies any trauma to the area.  Feels that it is tender down to the bone.  No overlying skin lesions or findings.  It is worse with movements and palpation of the area has not had any significant increase activity.  No numbness or weakness.   Past Medical History:  Diagnosis Date  . AICD (automatic cardioverter/defibrillator) present   . Chronic bronchitis ([place])    "get it most q yr" (12/23/2013)  . Chronic systolic (congestive) heart failure ([place])   . Fracture of left humerus    a. 07/2013.  Marland Kitchen GERD (gastroesophageal reflux disease)    not at present time  . Heart murmur    "born w/it"   . History of renal calculi   . Hypertension   . Kidney stones   . NICM (nonischemic cardiomyopathy) ([place])    a. 07/2013 Echo: EF 20-25%, diff HK, Gr2 DD, mild MR, mod dil LA/RA. EF 40% 2017 echo  . Obesity   . 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 Syncope   . Type II diabetes mellitus ([place]) 2006    Patient Active Problem List   Diagnosis Date Noted  . Glaucoma suspect 12/22/2018  . Seasonal and perennial allergic rhinoconjunctivitis 10/25/2018  . 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 Other allergic rhinitis 06/28/2018  . Urticaria 05/03/2018  . S/P inguinal hernia repair 03/01/2018  . Unilateral inguinal hernia  without obstruction or gangrene 07/24/2017  . 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  . Hypersomnia 02/28/2016  . Abnormal PFT 02/24/2016  . Diabetic polyneuropathy associated with type 2 diabetes mellitus ([place]) 12/18/2015  . Lumbar back pain 12/18/2015  . Controlled type 2 diabetes mellitus with complication, with long-term current use of insulin ([place]) 09/17/2015  . 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 Lesion of right median nerve at forearm 04/16/2015  . ICD (implantable cardioverter-defibrillator) in place 03/28/2014  . Chronic systolic congestive heart failure ([place]) 03/28/2014  . Ventricular tachycardia, non-sustained ([place]) 03/27/2014  . Nonischemic cardiomyopathy ([place]) 11/09/2013  . Syncope 10/20/2013  . Chronic chest pain 07/28/2013  . RENAL CALCULUS 05/13/2009  . Obesity 05/08/2009  . Former heavy cigarette smoker (20-39 per day) 05/08/2009  . HOARSENESS 05/08/2009  . ANXIETY 02/28/2008  . GERD 02/28/2008  . IRRITABLE BOWEL SYNDROME 02/28/2008    Past Surgical History:  Procedure Laterality Date  . CARDIAC CATHETERIZATION  08/10/13  . CHOLECYSTECTOMY  ~ 2010  . IMPLANTABLE CARDIOVERTER DEFIBRILLATOR IMPLANT  12/23/2013   STJ Fortify ICD implanted by [name] for cardiomyopathy and syncope  . IMPLANTABLE CARDIOVERTER DEFIBRILLATOR IMPLANT N/A 12/23/2013   Procedure: IMPLANTABLE CARDIOVERTER DEFIBRILLATOR IMPLANT;  Surgeon: Coralyn Mark, MD;  Location: [facility] CATH LAB;  Service: Cardiovascular;  Laterality: N/A;  . INGUINAL HERNIA REPAIR Left 03/01/2018  Procedure: OPEN REPAIR OF LEFT INGUINAL HERNIA WITH MESH;  Surgeon: Greer Pickerel, MD;  Location: [facility] ORS;  Service: General;  Laterality: Left;  . LEFT HEART CATHETERIZATION WITH CORONARY ANGIOGRAM N/A 08/10/2013   Procedure: LEFT HEART CATHETERIZATION WITH CORONARY ANGIOGRAM;  Surgeon: Jettie Booze, MD;  Location: [facility];  Service: Cardiovascular;  Laterality: N/A;  . ORIF HUMERUS FRACTURE  Left 08/12/2013   Procedure: OPEN REDUCTION INTERNAL FIXATION (ORIF) HUMERAL SHAFT FRACTURE;  Surgeon: Newt Minion, MD;  Location: [facility];  Service: Orthopedics;  Laterality: Left;  Open Reduction Internal Fixation Left Humerus  . URETEROSCOPY     "laser for kidney stones"        Home Medications    Prior to Admission medications   Medication Sig Start Date End Date Taking? Authorizing Provider  ACCU-CHEK FASTCLIX LANCETS MISC Use to check blood sugar 3 times daily. 07/08/18   [name], MD  aspirin 81 MG tablet Take 81 mg by mouth daily.    [provider]  atorvastatin (LIPITOR) 10 MG tablet Take 1 tablet (10 mg total) by mouth daily. 10/04/18   [name], MD  Blood Glucose Monitoring Suppl (ACCU-CHEK GUIDE) w/Device KIT 1 kit by Does not apply route 3 (three) times daily. Use to check blood sugar up to three times daily. 07/08/18   [name], MD  carvedilol (COREG) 6.25 MG tablet Take 1 tablet (6.25 mg total) by mouth 2 (two) times daily. 02/08/19   Lelon Perla, MD  cetirizine (ZYRTEC) 10 MG tablet Take 1 tablet (10 mg total) by mouth daily. 10/25/18   Garnet Sierras, DO  cyclobenzaprine (FLEXERIL) 10 MG tablet Take 1 tablet (10 mg total) by mouth 2 (two) times daily as needed for muscle spasms. 02/17/19   Gareth Morgan, MD  ENTRESTO 24-26 MG Take 1 tablet by mouth twice daily 02/01/19   Barrett, Evelene Croon, PA-C  EPINEPHrine 0.3 mg/0.3 mL IJ SOAJ injection Use as directed for severe allergic reactions. 07/02/18   Garnet Sierras, DO  fluticasone (FLONASE) 50 MCG/ACT nasal spray Use 1-2 sprays in each nostril once a day as needed for nasal congestion. 10/25/18   Garnet Sierras, DO  furosemide (LASIX) 80 MG tablet Take 0.5 tablets (40 mg total) by mouth 2 (two) times daily. 02/08/19   Lelon Perla, MD  gabapentin (NEURONTIN) 300 MG capsule Take 1 capsule (300 mg total) by mouth at bedtime. 11/10/18   [name], MD  glucose blood (ACCU-CHEK GUIDE) test strip  Use as instructed to check blood sugar 3 times daily. 07/08/18   [name], MD  Lancet Devices [facility]) lancets 1 each by Other route 3 (three) times daily. 10/20/14   Boykin Nearing, MD  levalbuterol [facility] HFA) 45 MCG/ACT inhaler Inhale 2 puffs into the lungs every 6 (six) hours as needed for wheezing. 07/01/18   [name], MD  mometasone (ELOCON) 0.1 % cream Apply 1 application topically daily. 12/13/18   Marzetta Board, DPM  sildenafil (VIAGRA) 50 MG tablet 1-2  tab 1/2-1 hr prior to intercourse.  Limit use to 2 tabs/24 hrs 07/12/18   [name], MD  sitaGLIPtin (JANUVIA) 25 MG tablet Take 1 tablet (25 mg total) by mouth daily. 11/10/18   [name], MD  tamsulosin [facility]) 0.4 MG CAPS capsule Take 2 capsules by mouth once daily 12/28/18   [name], MD  terbinafine (LAMISIL AT) 1 % cream Apply 1  application topically 2 (two) times daily. 10/04/18   [name], MD  triamcinolone cream (KENALOG) 0.1 % APPLY  CREAM EXTERNALLY TO AFFECTED AREA TWICE DAILY ON  TOE 08/04/18   [name], MD    Family History Family History  Problem Relation Age of Onset  . Diabetes Father   . Arthritis Father   . Hypertension Father   . Diabetes Mother   . Arthritis Mother   . Hypertension Mother   . Hypertension Brother     Social History Social History   Tobacco Use  . Smoking status: Former Smoker    Packs/day: 1.00    Years: 28.00    Pack years: 28.00    Types: Cigarettes    Quit date: 06/03/2014    Years since quitting: 4.7  . Smokeless tobacco: Never Used  Substance Use Topics  . Alcohol use: Yes    Alcohol/week: 0.0 standard drinks    Comment: 12/23/2013 "might drink a couple times/yr"  . Drug use: Yes    Types: Marijuana    Comment: 02/25/18- patient  states has not used marjuana     Allergies   Bee venom   Review of Systems Review of Systems  Constitutional: Negative for fever.  Respiratory: Negative for  shortness of breath.   Cardiovascular: Negative for chest pain.  Gastrointestinal: Negative for nausea and vomiting.  Musculoskeletal: Positive for arthralgias.  Skin: Negative for rash and wound.  Neurological: Negative for weakness and numbness.     Physical Exam Updated Vital Signs BP 109/66 (BP Location: Left Arm)   Pulse 73   Temp 98 F (36.7 C) (Oral)   Resp 16   Ht _0  (1.854 m)   Wt 95.3 kg   SpO2 98%   BMI 27.71 kg/m   Physical Exam Vitals signs and nursing note reviewed.  Constitutional:      General: He is not in acute distress.    Appearance: He is well-developed. He is not diaphoretic.  HENT:     Head: Normocephalic and atraumatic.  Eyes:     Conjunctiva/sclera: Conjunctivae normal.  Neck:     Musculoskeletal: Normal range of motion.  Cardiovascular:     Rate and Rhythm: Normal rate and regular rhythm.  Pulmonary:     Effort: Pulmonary effort is normal. No respiratory distress.  Musculoskeletal:        General: Tenderness (ulnar side of right forearm, no sign of abscess) present.     Comments: Normal strength finger abduction, wrist extension, opponens  Skin:    General: Skin is warm and dry.     Findings: No erythema or rash.  Neurological:     Mental Status: He is alert and oriented to person, place, and time.      ED Treatments / Results  Labs (all labs ordered are listed, but only abnormal results are displayed) Labs Reviewed - No data to display  EKG None  Radiology Dg Forearm Right  Result Date: 02/17/2019 CLINICAL DATA:  Pain EXAM: RIGHT FOREARM - 2 VIEW COMPARISON:  None. FINDINGS: There is no evidence of fracture or other focal bone lesions. Soft tissues are unremarkable. IMPRESSION: Negative. Electronically Signed   By: Rolm Baptise M.D.   On: 02/17/2019 20:54    Procedures Procedures (including critical care time)  Medications Ordered in ED Medications  acetaminophen (TYLENOL) tablet 1,000 mg (1,000 mg Oral Given 02/17/19  2102)     Initial Impression / Assessment and Plan / ED Course  I have  reviewed the triage vital signs and the nursing notes.  Pertinent labs & imaging results that were available during my care of the patient were reviewed by me and considered in my medical decision making (see chart for details).        53 year old male with a history of nonischemic, cardiomyopathy, defibrillator in place, diabetes presents with concern for right forearm pain.  X-ray shows no acute abnormalities.  He is neurovascularly intact.  No side of cellulitis, or abscess.  History overall not consistent with medial epicondylitis.  Consider anterior interosseous neuropathy as etiology of pain.  Or other possible tendinitis.  Given number for hand surgery for follow-up.  Recommend supportive care with Tylenol, ice and gave prescription for Flexeril.  Final Clinical Impressions(s) / ED Diagnoses   Final diagnoses:  Right forearm pain    ED Discharge Orders         Ordered    cyclobenzaprine (FLEXERIL) 10 MG tablet  2 times daily PRN     02/17/19 2111           Gareth Morgan, MD 02/19/19 1123

## 2019-03-02 DIAGNOSIS — N183 Chronic kidney disease, stage 3 (moderate): Secondary | ICD-10-CM | POA: Diagnosis not present

## 2019-03-07 DIAGNOSIS — E1122 Type 2 diabetes mellitus with diabetic chronic kidney disease: Secondary | ICD-10-CM | POA: Diagnosis not present

## 2019-03-07 DIAGNOSIS — N183 Chronic kidney disease, stage 3 (moderate): Secondary | ICD-10-CM | POA: Diagnosis not present

## 2019-03-07 DIAGNOSIS — N2581 Secondary hyperparathyroidism of renal origin: Secondary | ICD-10-CM | POA: Diagnosis not present

## 2019-03-07 DIAGNOSIS — D649 Anemia, unspecified: Secondary | ICD-10-CM | POA: Diagnosis not present

## 2019-03-07 DIAGNOSIS — I428 Other cardiomyopathies: Secondary | ICD-10-CM | POA: Diagnosis not present

## 2019-03-07 DIAGNOSIS — R42 Dizziness and giddiness: Secondary | ICD-10-CM | POA: Diagnosis not present

## 2019-03-07 DIAGNOSIS — N400 Enlarged prostate without lower urinary tract symptoms: Secondary | ICD-10-CM | POA: Diagnosis not present

## 2019-03-11 ENCOUNTER — Other Ambulatory Visit: Payer: Self-pay | Admitting: Physician Assistant

## 2019-03-11 DIAGNOSIS — I428 Other cardiomyopathies: Secondary | ICD-10-CM

## 2019-03-11 DIAGNOSIS — I519 Heart disease, unspecified: Secondary | ICD-10-CM

## 2019-03-14 ENCOUNTER — Encounter: Payer: Self-pay | Admitting: Podiatry

## 2019-03-14 ENCOUNTER — Ambulatory Visit: Payer: Medicaid Other | Admitting: Podiatry

## 2019-03-14 ENCOUNTER — Ambulatory Visit (INDEPENDENT_AMBULATORY_CARE_PROVIDER_SITE_OTHER): Payer: Medicaid Other

## 2019-03-14 ENCOUNTER — Other Ambulatory Visit: Payer: Self-pay

## 2019-03-14 DIAGNOSIS — M778 Other enthesopathies, not elsewhere classified: Secondary | ICD-10-CM

## 2019-03-14 DIAGNOSIS — M7751 Other enthesopathy of right foot: Secondary | ICD-10-CM

## 2019-03-14 DIAGNOSIS — B351 Tinea unguium: Secondary | ICD-10-CM

## 2019-03-14 DIAGNOSIS — M722 Plantar fascial fibromatosis: Secondary | ICD-10-CM

## 2019-03-14 DIAGNOSIS — M79675 Pain in left toe(s): Secondary | ICD-10-CM

## 2019-03-14 DIAGNOSIS — M79674 Pain in right toe(s): Secondary | ICD-10-CM | POA: Diagnosis not present

## 2019-03-14 DIAGNOSIS — E1142 Type 2 diabetes mellitus with diabetic polyneuropathy: Secondary | ICD-10-CM

## 2019-03-14 NOTE — Patient Instructions (Addendum)
Plantar Fasciitis (Heel Spur Syndrome) with Rehab The plantar fascia is a fibrous, ligament-like, soft-tissue structure that spans the bottom of the foot. Plantar fasciitis is a condition that causes pain in the foot due to inflammation of the tissue. SYMPTOMS   Pain and tenderness on the underneath side of the foot.  Pain that worsens with standing or walking. CAUSES  Plantar fasciitis is caused by irritation and injury to the plantar fascia on the underneath side of the foot. Common mechanisms of injury include:  Direct trauma to bottom of the foot.  Damage to a small nerve that runs under the foot where the main fascia attaches to the heel bone.  Stress placed on the plantar fascia due to bone spurs. RISK INCREASES WITH:   Activities that place stress on the plantar fascia (running, jumping, pivoting, or cutting).  Poor strength and flexibility.  Improperly fitted shoes.  Tight calf muscles.  Flat feet.  Failure to warm-up properly before activity.  Obesity. PREVENTION  Warm up and stretch properly before activity.  Allow for adequate recovery between workouts.  Maintain physical fitness:  Strength, flexibility, and endurance.  Cardiovascular fitness.  Maintain a health body weight.  Avoid stress on the plantar fascia.  Wear properly fitted shoes, including arch supports for individuals who have flat feet.  PROGNOSIS  If treated properly, then the symptoms of plantar fasciitis usually resolve without surgery. However, occasionally surgery is necessary.  RELATED COMPLICATIONS   Recurrent symptoms that may result in a chronic condition.  Problems of the lower back that are caused by compensating for the injury, such as limping.  Pain or weakness of the foot during push-off following surgery.  Chronic inflammation, scarring, and partial or complete fascia tear, occurring more often from repeated injections.  TREATMENT  Treatment initially involves the  use of ice and medication to help reduce pain and inflammation. The use of strengthening and stretching exercises may help reduce pain with activity, especially stretches of the Achilles tendon. These exercises may be performed at home or with a therapist. Your caregiver may recommend that you use heel cups of arch supports to help reduce stress on the plantar fascia. Occasionally, corticosteroid injections are given to reduce inflammation. If symptoms persist for greater than 6 months despite non-surgical (conservative), then surgery may be recommended.   MEDICATION   If pain medication is necessary, then nonsteroidal anti-inflammatory medications, such as aspirin and ibuprofen, or other minor pain relievers, such as acetaminophen, are often recommended.  Do not take pain medication within 7 days before surgery.  Prescription pain relievers may be given if deemed necessary by your caregiver. Use only as directed and only as much as you need.  Corticosteroid injections may be given by your caregiver. These injections should be reserved for the most serious cases, because they may only be given a certain number of times.  HEAT AND COLD  Cold treatment (icing) relieves pain and reduces inflammation. Cold treatment should be applied for 10 to 15 minutes every 2 to 3 hours for inflammation and pain and immediately after any activity that aggravates your symptoms. Use ice packs or massage the area with a piece of ice (ice massage).  Heat treatment may be used prior to performing the stretching and strengthening activities prescribed by your caregiver, physical therapist, or athletic trainer. Use a heat pack or soak the injury in warm water.  SEEK IMMEDIATE MEDICAL CARE IF:  Treatment seems to offer no benefit, or the condition worsens.  Any medications   produce adverse side effects.  EXERCISES- RANGE OF MOTION (ROM) AND STRETCHING EXERCISES - Plantar Fasciitis (Heel Spur Syndrome) These exercises  may help you when beginning to rehabilitate your injury. Your symptoms may resolve with or without further involvement from your physician, physical therapist or athletic trainer. While completing these exercises, remember:   Restoring tissue flexibility helps normal motion to return to the joints. This allows healthier, less painful movement and activity.  An effective stretch should be held for at least 30 seconds.  A stretch should never be painful. You should only feel a gentle lengthening or release in the stretched tissue.  RANGE OF MOTION - Toe Extension, Flexion  Sit with your right / left leg crossed over your opposite knee.  Grasp your toes and gently pull them back toward the top of your foot. You should feel a stretch on the bottom of your toes and/or foot.  Hold this stretch for 10 seconds.  Now, gently pull your toes toward the bottom of your foot. You should feel a stretch on the top of your toes and or foot.  Hold this stretch for 10 seconds. Repeat  times. Complete this stretch 3 times per day.   RANGE OF MOTION - Ankle Dorsiflexion, Active Assisted  Remove shoes and sit on a chair that is preferably not on a carpeted surface.  Place right / left foot under knee. Extend your opposite leg for support.  Keeping your heel down, slide your right / left foot back toward the chair until you feel a stretch at your ankle or calf. If you do not feel a stretch, slide your bottom forward to the edge of the chair, while still keeping your heel down.  Hold this stretch for 10 seconds. Repeat 3 times. Complete this stretch 2 times per day.   STRETCH  Gastroc, Standing  Place hands on wall.  Extend right / left leg, keeping the front knee somewhat bent.  Slightly point your toes inward on your back foot.  Keeping your right / left heel on the floor and your knee straight, shift your weight toward the wall, not allowing your back to arch.  You should feel a gentle stretch  in the right / left calf. Hold this position for 10 seconds. Repeat 3 times. Complete this stretch 2 times per day.  STRETCH  Soleus, Standing  Place hands on wall.  Extend right / left leg, keeping the other knee somewhat bent.  Slightly point your toes inward on your back foot.  Keep your right / left heel on the floor, bend your back knee, and slightly shift your weight over the back leg so that you feel a gentle stretch deep in your back calf.  Hold this position for 10 seconds. Repeat 3 times. Complete this stretch 2 times per day.  STRETCH  Gastrocsoleus, Standing  Note: This exercise can place a lot of stress on your foot and ankle. Please complete this exercise only if specifically instructed by your caregiver.   Place the ball of your right / left foot on a step, keeping your other foot firmly on the same step.  Hold on to the wall or a rail for balance.  Slowly lift your other foot, allowing your body weight to press your heel down over the edge of the step.  You should feel a stretch in your right / left calf.  Hold this position for 10 seconds.  Repeat this exercise with a slight bend in your right /   left knee. Repeat 3 times. Complete this stretch 2 times per day.   STRENGTHENING EXERCISES - Plantar Fasciitis (Heel Spur Syndrome)  These exercises may help you when beginning to rehabilitate your injury. They may resolve your symptoms with or without further involvement from your physician, physical therapist or athletic trainer. While completing these exercises, remember:   Muscles can gain both the endurance and the strength needed for everyday activities through controlled exercises.  Complete these exercises as instructed by your physician, physical therapist or athletic trainer. Progress the resistance and repetitions only as guided.  STRENGTH - Towel Curls  Sit in a chair positioned on a non-carpeted surface.  Place your foot on a towel, keeping your heel  on the floor.  Pull the towel toward your heel by only curling your toes. Keep your heel on the floor. Repeat 3 times. Complete this exercise 2 times per day.  STRENGTH - Ankle Inversion  Secure one end of a rubber exercise band/tubing to a fixed object (table, pole). Loop the other end around your foot just before your toes.  Place your fists between your knees. This will focus your strengthening at your ankle.  Slowly, pull your big toe up and in, making sure the band/tubing is positioned to resist the entire motion.  Hold this position for 10 seconds.  Have your muscles resist the band/tubing as it slowly pulls your foot back to the starting position. Repeat 3 times. Complete this exercises 2 times per day.  Document Released: 05/26/2005 Document Revised: 08/18/2011 Document Reviewed: 09/07/2008 [facility] Patient Information 2014 [place], [place]. Diabetes Mellitus and Foot Care Foot care is an important part of your health, especially when you have diabetes. Diabetes may cause you to have problems because of poor blood flow (circulation) to your feet and legs, which can cause your skin to:  Become thinner and drier.  Break more easily.  Heal more slowly.  Peel and crack. You may also have nerve damage (neuropathy) in your legs and feet, causing decreased feeling in them. This means that you may not notice minor injuries to your feet that could lead to more serious problems. Noticing and addressing any potential problems early is the best way to prevent future foot problems. How to care for your feet Foot hygiene  Wash your feet daily with warm water and mild soap. Do not use hot water. Then, pat your feet and the areas between your toes until they are completely dry. Do not soak your feet as this can dry your skin.  Trim your toenails straight across. Do not dig under them or around the cuticle. File the edges of your nails with an emery board or nail file.  Apply a  moisturizing lotion or petroleum jelly to the skin on your feet and to dry, brittle toenails. Use lotion that does not contain alcohol and is unscented. Do not apply lotion between your toes. Shoes and socks  Wear clean socks or stockings every day. Make sure they are not too tight. Do not wear knee-high stockings since they may decrease blood flow to your legs.  Wear shoes that fit properly and have enough cushioning. Always look in your shoes before you put them on to be sure there are no objects inside.  To break in new shoes, wear them for just a few hours a day. This prevents injuries on your feet. Wounds, scrapes, corns, and calluses  Check your feet daily for blisters, cuts, bruises, sores, and redness. If you cannot see  see the bottom of your feet, use a mirror or ask someone for help.  Do not cut corns or calluses or try to remove them with medicine.  If you find a minor scrape, cut, or break in the skin on your feet, keep it and the skin around it clean and dry. You may clean these areas with mild soap and water. Do not clean the area with peroxide, alcohol, or iodine.  If you have a wound, scrape, corn, or callus on your foot, look at it several times a day to make sure it is healing and not infected. Check for: ? Redness, swelling, or pain. ? Fluid or blood. ? Warmth. ? Pus or a bad smell. General instructions  Do not cross your legs. This may decrease blood flow to your feet.  Do not use heating pads or hot water bottles on your feet. They may burn your skin. If you have lost feeling in your feet or legs, you may not know this is happening until it is too late.  Protect your feet from hot and cold by wearing shoes, such as at the beach or on hot pavement.  Schedule a complete foot exam at least once a year (annually) or more often if you have foot problems. If you have foot problems, report any cuts, sores, or bruises to your health care provider immediately. Contact a health  care provider if:  You have a medical condition that increases your risk of infection and you have any cuts, sores, or bruises on your feet.  You have an injury that is not healing.  You have redness on your legs or feet.  You feel burning or tingling in your legs or feet.  You have pain or cramps in your legs and feet.  Your legs or feet are numb.  Your feet always feel cold.  You have pain around a toenail. Get help right away if:  You have a wound, scrape, corn, or callus on your foot and: ? You have pain, swelling, or redness that gets worse. ? You have fluid or blood coming from the wound, scrape, corn, or callus. ? Your wound, scrape, corn, or callus feels warm to the touch. ? You have pus or a bad smell coming from the wound, scrape, corn, or callus. ? You have a fever. ? You have a red line going up your leg. Summary  Check your feet every day for cuts, sores, red spots, swelling, and blisters.  Moisturize feet and legs daily.  Wear shoes that fit properly and have enough cushioning.  If you have foot problems, report any cuts, sores, or bruises to your health care provider immediately.  Schedule a complete foot exam at least once a year (annually) or more often if you have foot problems. This information is not intended to replace advice given to you by your health care provider. Make sure you discuss any questions you have with your health care provider. Document Released: 05/23/2000 Document Revised: 07/08/2017 Document Reviewed: 06/27/2016 [employer] Patient Education  2020 [employer] Onychomycosis/Fungal Toenails  WHAT IS IT? An infection that lies within the keratin of your nail plate that is caused by a fungus.  WHY ME? Fungal infections affect all ages, sexes, races, and creeds.  There may be many factors that predispose you to a fungal infection such as age, coexisting medical conditions such as diabetes, or an autoimmune disease; stress, medications,  fatigue, genetics, etc.  Bottom line: fungus thrives in a warm, moist   environment and your shoes offer such a location.  IS IT CONTAGIOUS? Theoretically, yes.  You do not want to share shoes, nail clippers or files with someone who has fungal toenails.  Walking around barefoot in the same room or sleeping in the same bed is unlikely to transfer the organism.  It is important to realize, however, that fungus can spread easily from one nail to the next on the same foot.  HOW DO WE TREAT THIS?  There are several ways to treat this condition.  Treatment may depend on many factors such as age, medications, pregnancy, liver and kidney conditions, etc.  It is best to ask your doctor which options are available to you.  1. No treatment.   Unlike many other medical concerns, you can live with this condition.  However for many people this can be a painful condition and may lead to ingrown toenails or a bacterial infection.  It is recommended that you keep the nails cut short to help reduce the amount of fungal nail. 2. Topical treatment.  These range from herbal remedies to prescription strength nail lacquers.  About 40-50% effective, topicals require twice daily application for approximately 9 to 12 months or until an entirely new nail has grown out.  The most effective topicals are medical grade medications available through physicians offices. 3. Oral antifungal medications.  With an 80-90% cure rate, the most common oral medication requires 3 to 4 months of therapy and stays in your system for a year as the new nail grows out.  Oral antifungal medications do require blood work to make sure it is a safe drug for you.  A liver function panel will be performed prior to starting the medication and after the first month of treatment.  It is important to have the blood work performed to avoid any harmful side effects.  In general, this medication safe but blood work is required. 4. Laser Therapy.  This treatment is  performed by applying a specialized laser to the affected nail plate.  This therapy is noninvasive, fast, and non-painful.  It is not covered by insurance and is therefore, out of pocket.  The results have been very good with a 80-95% cure rate.  [facility] is the only practice in the area to offer this therapy. 5. Permanent Nail Avulsion.  Removing the entire nail so that a new nail will not grow back. 

## 2019-03-17 NOTE — Progress Notes (Signed)
Subjective: [name] is seen today for follow up painful, elongated, thickened toenails 1-5 b/l feet that he cannot cut. Pain interferes with daily activities. Aggravating factor includes wearing enclosed shoe gear and relieved with periodic debridement.  Today, pt c/o painful right foot along medial arch area. He runs for exercise daily. States it has been present for several months. Has purchased new sneakers in the past couple of months.  He has been using Mometasone Cream 0.1% for eczema of  right hallux and states he doesn't apply it every day.   He has symptomatic neuropathy and takes gabapentin for this.  Current Outpatient Medications on File Prior to Visit  Medication Sig  . ACCU-CHEK FASTCLIX LANCETS MISC Use to check blood sugar 3 times daily.  Marland Kitchen aspirin 81 MG tablet Take 81 mg by mouth daily.  Marland Kitchen atorvastatin (LIPITOR) 10 MG tablet Take 1 tablet (10 mg total) by mouth daily.  . Blood Glucose Monitoring Suppl (ACCU-CHEK GUIDE) w/Device KIT 1 kit by Does not apply route 3 (three) times daily. Use to check blood sugar up to three times daily.  . carvedilol (COREG) 6.25 MG tablet Take 1 tablet (6.25 mg total) by mouth 2 (two) times daily.  . cetirizine (ZYRTEC) 10 MG tablet Take 1 tablet (10 mg total) by mouth daily.  . cyclobenzaprine (FLEXERIL) 10 MG tablet Take 1 tablet (10 mg total) by mouth 2 (two) times daily as needed for muscle spasms.  Marland Kitchen ENTRESTO 24-26 MG Take 1 tablet by mouth twice daily  . EPINEPHrine 0.3 mg/0.3 mL IJ SOAJ injection Use as directed for severe allergic reactions.  . fluticasone (FLONASE) 50 MCG/ACT nasal spray Use 1-2 sprays in each nostril once a day as needed for nasal congestion.  . furosemide (LASIX) 80 MG tablet Take 0.5 tablets (40 mg total) by mouth 2 (two) times daily.  Marland Kitchen gabapentin (NEURONTIN) 300 MG capsule Take 1 capsule (300 mg total) by mouth at bedtime.  Marland Kitchen glucose blood (ACCU-CHEK GUIDE) test strip Use as instructed to check blood sugar 3  times daily.  Elmore Guise Devices (ACCU-CHEK SOFTCLIX) lancets 1 each by Other route 3 (three) times daily.  Marland Kitchen levalbuterol (XOPENEX HFA) 45 MCG/ACT inhaler Inhale 2 puffs into the lungs every 6 (six) hours as needed for wheezing.  . mometasone (ELOCON) 0.1 % cream Apply 1 application topically daily.  . sildenafil (VIAGRA) 50 MG tablet 1-2  tab 1/2-1 hr prior to intercourse.  Limit use to 2 tabs/24 hrs  . sitaGLIPtin (JANUVIA) 25 MG tablet Take 1 tablet (25 mg total) by mouth daily.  . tamsulosin (FLOMAX) 0.4 MG CAPS capsule Take 2 capsules by mouth once daily  . terbinafine (LAMISIL AT) 1 % cream Apply 1 application topically 2 (two) times daily.  Marland Kitchen triamcinolone cream (KENALOG) 0.1 % APPLY  CREAM EXTERNALLY TO AFFECTED AREA TWICE DAILY ON  TOE   No current facility-administered medications on file prior to visit.      Allergies  Allergen Reactions  . Bee Venom Anaphylaxis   Objective:  Vascular Examination: Capillary refill time immediate x 10 digits.  Dorsalis pedis present b/l.  Posterior tibial pulses present b/l.  Digital hair  present x 10 digits.  Skin temperature gradient WNL b/l.   Dermatological Examination: Skin with normal turgor, texture and tone b/l.  Toenails 1-5 b/l discolored, thick, dystrophic with subungual debris and pain with palpation to nailbeds due to thickness of nails.  Musculoskeletal: Muscle strength 5/5 to all LE muscle groups b/l.  Pain on palpation to medial band plantar fascia left foot.   No pain, crepitus or joint limitation noted with ROM.   Neurological Examination: Protective sensation intact with 10 gram monofilament bilaterally.  Epicritic sensation present bilaterally.  Vibratory sensation intact bilaterally.   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 Toenails 1-5 b/l were debrided in length and girth without iatrogenic bleeding. 2. Discussed plantar fasciitis, stretching exercises. Instructions dispensed on today. 3. Plantar fascial brace dispensed for left foot.  4. Patient to continue soft, supportive shoe gear 5. Patient to report any pedal injuries to medical professional immediately. 6. Follow up 3 months.  7. Patient/POA to call should there be a concern in the interim.

## 2019-03-24 ENCOUNTER — Encounter: Payer: Self-pay | Admitting: Internal Medicine

## 2019-03-24 ENCOUNTER — Other Ambulatory Visit: Payer: Self-pay

## 2019-03-24 ENCOUNTER — Ambulatory Visit: Payer: Medicaid Other | Attending: Internal Medicine | Admitting: Internal Medicine

## 2019-03-24 VITALS — BP 105/69 | HR 64 | Temp 98.7°F | Resp 16 | Wt 225.4 lb

## 2019-03-24 DIAGNOSIS — E669 Obesity, unspecified: Secondary | ICD-10-CM | POA: Diagnosis not present

## 2019-03-24 DIAGNOSIS — E663 Overweight: Secondary | ICD-10-CM | POA: Diagnosis not present

## 2019-03-24 DIAGNOSIS — R079 Chest pain, unspecified: Secondary | ICD-10-CM | POA: Insufficient documentation

## 2019-03-24 DIAGNOSIS — E1142 Type 2 diabetes mellitus with diabetic polyneuropathy: Secondary | ICD-10-CM | POA: Diagnosis not present

## 2019-03-24 DIAGNOSIS — Z6829 Body mass index (BMI) 29.0-29.9, adult: Secondary | ICD-10-CM | POA: Diagnosis not present

## 2019-03-24 DIAGNOSIS — I472 Ventricular tachycardia: Secondary | ICD-10-CM | POA: Diagnosis not present

## 2019-03-24 DIAGNOSIS — H538 Other visual disturbances: Secondary | ICD-10-CM

## 2019-03-24 DIAGNOSIS — Z2821 Immunization not carried out because of patient refusal: Secondary | ICD-10-CM

## 2019-03-24 DIAGNOSIS — I5022 Chronic systolic (congestive) heart failure: Secondary | ICD-10-CM | POA: Diagnosis not present

## 2019-03-24 DIAGNOSIS — M79631 Pain in right forearm: Secondary | ICD-10-CM | POA: Diagnosis not present

## 2019-03-24 DIAGNOSIS — M545 Low back pain: Secondary | ICD-10-CM | POA: Insufficient documentation

## 2019-03-24 DIAGNOSIS — N184 Chronic kidney disease, stage 4 (severe): Secondary | ICD-10-CM | POA: Diagnosis not present

## 2019-03-24 DIAGNOSIS — Z8249 Family history of ischemic heart disease and other diseases of the circulatory system: Secondary | ICD-10-CM | POA: Diagnosis not present

## 2019-03-24 DIAGNOSIS — Z79899 Other long term (current) drug therapy: Secondary | ICD-10-CM | POA: Diagnosis not present

## 2019-03-24 DIAGNOSIS — Z87891 Personal history of nicotine dependence: Secondary | ICD-10-CM | POA: Insufficient documentation

## 2019-03-24 DIAGNOSIS — I429 Cardiomyopathy, unspecified: Secondary | ICD-10-CM | POA: Insufficient documentation

## 2019-03-24 DIAGNOSIS — Z7982 Long term (current) use of aspirin: Secondary | ICD-10-CM | POA: Diagnosis not present

## 2019-03-24 DIAGNOSIS — Z794 Long term (current) use of insulin: Secondary | ICD-10-CM | POA: Diagnosis not present

## 2019-03-24 DIAGNOSIS — Z95810 Presence of automatic (implantable) cardiac defibrillator: Secondary | ICD-10-CM | POA: Diagnosis not present

## 2019-03-24 LAB — GLUCOSE, POCT (MANUAL RESULT ENTRY): POC Glucose: 158 mg/dl — AB (ref 70–99)

## 2019-03-24 LAB — POCT GLYCOSYLATED HEMOGLOBIN (HGB A1C): HbA1c, POC (controlled diabetic range): 6.7 % (ref 0.0–7.0)

## 2019-03-24 NOTE — Progress Notes (Signed)
Pt states he has been seeing blue spots

## 2019-03-24 NOTE — Progress Notes (Signed)
Patient ID: [name], male    DOB: 01-14-1966  MRN: [id number]  CC: Diabetes   Subjective: [name] is a 53 y.o. male who presents for chronic ds management His concerns today include:  Patient with history ofNICM, systolic CHF s/p ICD placement,DMtype 2, HL,CKD stage 4,obesity, BPH, GERD and chronic LBP secondary to spondylosis and spinal stenosis.  Complains of seeing flashes of blue spots in both eyes x 2 days. Using Viagra 1-2 x a wk.  Reports that he had been using it more recently Last eye exam with Dr. Schuyler Amor was in July of this year.  DM/Obesity: He is on low-dose Januvia.  He is still doing well with eating habits.  He tries to restrict caloric intake to about 1500 cal/day.  He is still walking about 3 miles a day.  Checks blood sugars daily.  Range has been 110-140.  He weighs himself several times a week.  Last weight at home this morning was 211 pounds.  Weight here today was 225 pounds.  Patient does not think the scale here is accurate.  CKD 4:  Seen few wks ago by the kidney specialist.  He thinks he was told that his kidney function is a little better may be stage III.  I have not received the notes as yet.  He is not on any NSAIDs.  NICM: Saw his cardiologist [name] last month.  Denies any shortness of breath or lower extremity edema.  He is compliant with his medications that include Entresto, carvedilol, furosemide  Patient complains of pain in his right forearm for which he was seen in the emergency room last month.  The pain is in the muscles of the dorsal surface of the forearm.  Sometimes he also feels it in the left forearm muscles.  He does a lot of lifting.  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  He is right-handed.  He has been using Tylenol.  Patient Active Problem List   Diagnosis Date Noted  . Glaucoma suspect 12/22/2018  . Seasonal and perennial allergic rhinoconjunctivitis 10/25/2018  . Anaphylaxis  due to hymenoptera venom 06/28/2018  . Other allergic rhinitis 06/28/2018  . Urticaria 05/03/2018  . S/P inguinal hernia repair 03/01/2018  . Unilateral inguinal hernia without obstruction or gangrene 07/24/2017  . 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  . Hypersomnia 02/28/2016  . Abnormal PFT 02/24/2016  . Diabetic polyneuropathy associated with type 2 diabetes mellitus ([place]) 12/18/2015  . Lumbar back pain 12/18/2015  . Controlled type 2 diabetes mellitus with complication, with long-term current use of insulin ([place]) 09/17/2015  . 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 Lesion of right median nerve at forearm 04/16/2015  . ICD (implantable cardioverter-defibrillator) in place 03/28/2014  . Chronic systolic congestive heart failure ([place]) 03/28/2014  . Ventricular tachycardia, non-sustained ([place]) 03/27/2014  . Nonischemic cardiomyopathy ([place]) 11/09/2013  . Syncope 10/20/2013  . Chronic chest pain 07/28/2013  . RENAL CALCULUS 05/13/2009  . Obesity 05/08/2009  . Former heavy cigarette smoker (20-39 per day) 05/08/2009  . HOARSENESS 05/08/2009  . ANXIETY 02/28/2008  . GERD 02/28/2008  . IRRITABLE BOWEL SYNDROME 02/28/2008     Current Outpatient Medications on File Prior to Visit  Medication Sig Dispense Refill  . ACCU-CHEK FASTCLIX LANCETS MISC Use to check blood sugar 3 times daily. 102 each 11  . aspirin 81 MG tablet Take 81 mg by mouth daily.    Marland Kitchen atorvastatin (LIPITOR) 10  MG tablet Take 1 tablet (10 mg total) by mouth daily. 30 tablet 11  . Blood Glucose Monitoring Suppl (ACCU-CHEK GUIDE) w/Device KIT 1 kit by Does not apply route 3 (three) times daily. Use to check blood sugar up to three times daily. 1 kit 0  . carvedilol (COREG) 6.25 MG tablet Take 1 tablet (6.25 mg total) by mouth 2 (two) times daily. 180 tablet 3  . cetirizine (ZYRTEC) 10 MG tablet Take 1 tablet (10 mg total) by mouth daily. 30 tablet 3  . cyclobenzaprine (FLEXERIL) 10 MG tablet Take  1 tablet (10 mg total) by mouth 2 (two) times daily as needed for muscle spasms. 20 tablet 0  . ENTRESTO 24-26 MG Take 1 tablet by mouth twice daily 60 tablet 0  . EPINEPHrine 0.3 mg/0.3 mL IJ SOAJ injection Use as directed for severe allergic reactions. 2 Device 1  . fluticasone (FLONASE) 50 MCG/ACT nasal spray Use 1-2 sprays in each nostril once a day as needed for nasal congestion. 16 g 3  . furosemide (LASIX) 80 MG tablet Take 0.5 tablets (40 mg total) by mouth 2 (two) times daily. 90 tablet 3  . gabapentin (NEURONTIN) 300 MG capsule Take 1 capsule (300 mg total) by mouth at bedtime. 90 capsule 2  . glucose blood (ACCU-CHEK GUIDE) test strip Use as instructed to check blood sugar 3 times daily. 100 each 11  . Lancet Devices (ACCU-CHEK SOFTCLIX) lancets 1 each by Other route 3 (three) times daily. 1 each 0  . levalbuterol (XOPENEX HFA) 45 MCG/ACT inhaler Inhale 2 puffs into the lungs every 6 (six) hours as needed for wheezing. 1 Inhaler 1  . mometasone (ELOCON) 0.1 % cream Apply 1 application topically daily. 45 g 0  . sildenafil (VIAGRA) 50 MG tablet 1-2  tab 1/2-1 hr prior to intercourse.  Limit use to 2 tabs/24 hrs 20 tablet 1  . sitaGLIPtin (JANUVIA) 25 MG tablet Take 1 tablet (25 mg total) by mouth daily. 90 tablet 2  . tamsulosin (FLOMAX) 0.4 MG CAPS capsule Take 2 capsules by mouth once daily 60 capsule 2  . terbinafine (LAMISIL AT) 1 % cream Apply 1 application topically 2 (two) times daily. 30 g 0  . triamcinolone cream (KENALOG) 0.1 % APPLY  CREAM EXTERNALLY TO AFFECTED AREA TWICE DAILY ON  TOE 30 g 0   No current facility-administered medications on file prior to visit.     Allergies  Allergen Reactions  . Bee Venom Anaphylaxis    Social History   Socioeconomic History  . Marital status: Single    Spouse name: Not on file  . Number of children: Not on file  . Years of education: Not on file  . Highest education level: Not on file  Occupational History  . Occupation:  Disabled  Social Needs  . Financial resource strain: Not on file  . Food insecurity    Worry: Not on file    Inability: Not on file  . Transportation needs    Medical: Not on file    Non-medical: Not on file  Tobacco Use  . Smoking status: Former Smoker    Packs/day: 1.00    Years: 28.00    Pack years: 28.00    Types: Cigarettes    Quit date: 06/03/2014    Years since quitting: 4.8  . Smokeless tobacco: Never Used  Substance and Sexual Activity  . Alcohol use: Yes    Alcohol/week: 0.0 standard drinks    Comment: 12/23/2013 "might drink a  couple times/yr"  . Drug use: Yes    Types: Marijuana    Comment: 02/25/18- patient  states has not used marjuana  . Sexual activity: Yes  Lifestyle  . Physical activity    Days per week: Not on file    Minutes per session: Not on file  . Stress: Not on file  Relationships  . Social Herbalist on phone: Not on file    Gets together: Not on file    Attends religious service: Not on file    Active member of club or organization: Not on file    Attends meetings of clubs or organizations: Not on file    Relationship status: Not on file  . Intimate partner violence    Fear of current or ex partner: Not on file    Emotionally abused: Not on file    Physically abused: Not on file    Forced sexual activity: Not on file  Other Topics Concern  . Not on file  Social History Narrative   Pt lives with his brother     Family History  Problem Relation Age of Onset  . Diabetes Father   . Arthritis Father   . Hypertension Father   . Diabetes Mother   . Arthritis Mother   . Hypertension Mother   . Hypertension Brother     Past Surgical History:  Procedure Laterality Date  . CARDIAC CATHETERIZATION  08/10/13  . CHOLECYSTECTOMY  ~ 2010  . IMPLANTABLE CARDIOVERTER DEFIBRILLATOR IMPLANT  12/23/2013   STJ Fortify ICD implanted by [name] for cardiomyopathy and syncope  . IMPLANTABLE CARDIOVERTER DEFIBRILLATOR IMPLANT N/A 12/23/2013    Procedure: IMPLANTABLE CARDIOVERTER DEFIBRILLATOR IMPLANT;  Surgeon: Coralyn Mark, MD;  Location: [facility] CATH [facility];  Service: Cardiovascular;  Laterality: N/A;  . INGUINAL HERNIA REPAIR Left 03/01/2018   Procedure: OPEN REPAIR OF LEFT INGUINAL HERNIA WITH MESH;  Surgeon: Greer Pickerel, MD;  Location: [facility];  Service: General;  Laterality: Left;  . LEFT HEART CATHETERIZATION WITH CORONARY ANGIOGRAM N/A 08/10/2013   Procedure: LEFT HEART CATHETERIZATION WITH CORONARY ANGIOGRAM;  Surgeon: Jettie Booze, MD;  Location: [facility] CATH LAB;  Service: Cardiovascular;  Laterality: N/A;  . ORIF HUMERUS FRACTURE Left 08/12/2013   Procedure: OPEN REDUCTION INTERNAL FIXATION (ORIF) HUMERAL SHAFT FRACTURE;  Surgeon: Newt Minion, MD;  Location: [facility];  Service: Orthopedics;  Laterality: Left;  Open Reduction Internal Fixation Left Humerus  . URETEROSCOPY     "laser for kidney stones"    ROS: Review of Systems Negative except as stated above  PHYSICAL EXAM: BP 105/69   Pulse 64   Temp 98.7 F (37.1 C) (Oral)   Resp 16   Wt 225 lb 6.4 oz (102.2 kg)   SpO2 95%   BMI 29.74 kg/m   Wt Readings from Last 3 Encounters:  03/24/19 225 lb 6.4 oz (102.2 kg)  02/17/19 210 lb (95.3 kg)  02/08/19 211 lb (95.7 kg)   Physical Exam  General appearance - alert, well appearing, and in no distress Mental status - normal mood, behavior, speech, dress, motor activity, and thought processes Neck - supple, no significant adenopathy Chest - clear to auscultation, no wheezes, rales or rhonchi, symmetric air entry Heart - normal rate, regular rhythm, normal S1, S2, no murmurs, rubs, clicks or gallops Extremities - peripheral pulses normal, no pedal edema, no clubbing or cyanosis MSK: Mild tenderness on palpation of the forearm bone and muscles right arm.  Power is 5/5 CMP  Latest Ref Rng & Units 10/13/2018 06/28/2018 02/26/2018  Glucose 65 - 99 mg/dL 118(H) 168(H) 175(H)  BUN 6 - 24 mg/dL 38(H) 18 23(H)  Creatinine 0.76 -  1.27 mg/dL 2.75(H) 1.66(H) 1.99(H)  Sodium 134 - 144 mmol/L 142 142 141  Potassium 3.5 - 5.2 mmol/L 4.4 4.0 4.3  Chloride 96 - 106 mmol/L 103 102 100  CO2 20 - 29 mmol/L 24 24 30   Calcium 8.7 - 10.2 mg/dL 9.5 9.3 9.3  Total Protein 6.0 - 8.5 g/dL - 7.0 -  Total Bilirubin 0.0 - 1.2 mg/dL - 0.4 -  Alkaline Phos 39 - 117 IU/L - 50 -  AST 0 - 40 IU/L - 14 -  ALT 0 - 44 IU/L - 12 -   Lipid Panel     Component Value Date/Time   CHOL 205 (H) 10/13/2018 1415   TRIG 192 (H) 10/13/2018 1415   HDL 36 (L) 10/13/2018 1415   CHOLHDL 5.7 (H) 10/13/2018 1415   CHOLHDL 4 05/08/2009 1040   VLDL 27.0 05/08/2009 1040   LDLCALC 131 (H) 10/13/2018 1415    CBC    Component Value Date/Time   WBC 4.5 06/28/2018 1440   WBC 4.7 02/26/2018 1134   RBC 4.17 06/28/2018 1440   RBC 4.06 (L) 02/26/2018 1134   HGB 12.5 (L) 06/28/2018 1440   HCT 36.9 (L) 06/28/2018 1440   PLT 301 06/28/2018 1440   MCV 89 06/28/2018 1440   MCH 30.0 06/28/2018 1440   MCH 30.0 02/26/2018 1134   MCHC 33.9 06/28/2018 1440   MCHC 33.3 02/26/2018 1134   RDW 13.4 06/28/2018 1440   LYMPHSABS 1.7 06/28/2018 1440   MONOABS 0.6 09/21/2017 2158   EOSABS 0.3 06/28/2018 1440   BASOSABS 0.0 06/28/2018 1440   Results for orders placed or performed in visit on 03/24/19  POCT glucose (manual entry)  Result Value Ref Range   POC Glucose 158 (A) 70 - 99 mg/dl  POCT glycosylated hemoglobin (Hb A1C)  Result Value Ref Range   Hemoglobin A1C     HbA1c POC (<> result, manual entry)     HbA1c, POC (prediabetic range)     HbA1c, POC (controlled diabetic range) 6.7 0.0 - 7.0 %    ASSESSMENT AND PLAN:  1. Type 2 diabetes mellitus with diabetic polyneuropathy, without long-term current use of insulin (HCC) Controlled.  Continue Januvia.  Commended him and encouraged him to continue healthy eating habits and regular exercise - POCT glucose (manual entry) - POCT glycosylated hemoglobin (Hb A1C) - Microalbumin / creatinine urine ratio   2. Flashing lights seen Could be due to Viagra.  I recommend that he discontinue taking it for now. Recommend that he call his ophthalmologist tomorrow and request an urgent appointment - Ambulatory referral to Ophthalmology  3. Pain of right forearm Likely strain from heavy lifting.  I told him that he may have to discontinue helping his friend.  Continue to use Tylenol as needed.  May consider over-the-counter BenGay  4. CKD (chronic kidney disease) stage 4, GFR 15-29 ml/min (HCC) Await note from his nephrologist  5. Influenza vaccination declined   6. Systolic CHF, chronic (HCC) Clinically stable and compensated.  Followed by cardiology.  Continue current medications  7. Over weight Large discrepancy between his home scale reading and what we see here today.  I encouraged him however to continue healthy eating habits and regular exercise     Patient was given the opportunity to ask questions.  Patient verbalized understanding of  the plan and was able to repeat key elements of the plan.   Orders Placed This Encounter  Procedures  . Microalbumin / creatinine urine ratio  . POCT glucose (manual entry)  . POCT glycosylated hemoglobin (Hb A1C)     Requested Prescriptions    No prescriptions requested or ordered in this encounter    No follow-ups on file.  Karle Plumber, MD, FACP

## 2019-03-24 NOTE — Patient Instructions (Signed)
Stop Viagra for now. Please call your ophthalmologist Dr. Schuyler Amor tomorrow and request an urgent appointment.

## 2019-03-25 LAB — MICROALBUMIN / CREATININE URINE RATIO
Creatinine, Urine: 118.1 mg/dL
Microalb/Creat Ratio: 4 mg/g creat (ref 0–29)
Microalbumin, Urine: 4.4 ug/mL

## 2019-03-27 ENCOUNTER — Other Ambulatory Visit: Payer: Self-pay | Admitting: Physician Assistant

## 2019-03-27 ENCOUNTER — Other Ambulatory Visit: Payer: Self-pay | Admitting: Internal Medicine

## 2019-03-27 DIAGNOSIS — I5042 Chronic combined systolic (congestive) and diastolic (congestive) heart failure: Secondary | ICD-10-CM

## 2019-03-28 DIAGNOSIS — H2513 Age-related nuclear cataract, bilateral: Secondary | ICD-10-CM | POA: Diagnosis not present

## 2019-03-28 DIAGNOSIS — H538 Other visual disturbances: Secondary | ICD-10-CM | POA: Diagnosis not present

## 2019-03-28 DIAGNOSIS — H04123 Dry eye syndrome of bilateral lacrimal glands: Secondary | ICD-10-CM | POA: Diagnosis not present

## 2019-03-28 LAB — HM DIABETES EYE EXAM

## 2019-03-29 ENCOUNTER — Other Ambulatory Visit: Payer: Self-pay

## 2019-03-29 ENCOUNTER — Telehealth: Payer: Self-pay

## 2019-03-29 MED ORDER — CETIRIZINE HCL 10 MG PO TABS: 10.0000 mg | ORAL_TABLET | Freq: Every day | ORAL | 0 refills | Status: DC

## 2019-03-29 NOTE — Telephone Encounter (Signed)
Rx request sent to pharmacy.  

## 2019-03-29 NOTE — Telephone Encounter (Signed)
Contacted pt to go over urine results pt is aware and doesn't have any questions or concerns

## 2019-04-05 ENCOUNTER — Telehealth: Payer: Self-pay

## 2019-04-05 NOTE — Telephone Encounter (Signed)
Left message for patient to remind of missed remote transmission.  

## 2019-04-06 ENCOUNTER — Ambulatory Visit: Payer: Medicaid Other

## 2019-04-07 ENCOUNTER — Ambulatory Visit (INDEPENDENT_AMBULATORY_CARE_PROVIDER_SITE_OTHER): Payer: Medicaid Other | Admitting: Family Medicine

## 2019-04-07 ENCOUNTER — Encounter: Payer: Self-pay | Admitting: Family Medicine

## 2019-04-07 DIAGNOSIS — J0100 Acute maxillary sinusitis, unspecified: Secondary | ICD-10-CM | POA: Diagnosis not present

## 2019-04-07 MED ORDER — AMOXICILLIN 500 MG PO CAPS: 500.0000 mg | ORAL_CAPSULE | Freq: Three times a day (TID) | ORAL | 0 refills | Status: DC

## 2019-04-07 NOTE — Progress Notes (Signed)
Virtual Visit via Telephone Note  I connected with [name], on 04/07/2019 at 8:37 AM by telephone due to the COVID-19 pandemic and verified that I am speaking with the correct person using two identifiers.   Consent: I discussed the limitations, risks, security and privacy concerns of performing an evaluation and management service by telephone and the availability of in person appointments. I also discussed with the patient that there may be a patient responsible charge related to this service. The patient expressed understanding and agreed to proceed.   Location of Patient: Home  Location of Provider: Clinic   Persons participating in Telemedicine visit: Kyzir Matsuyama - CMA Dr. Felecia Shelling     History of Present Illness: 53 year old male patient of Dr Wynetta Emery with a history of Type 2 DM (A1c  6.7),  Stage 3 CKD, HTN, CHF s/p ICD, NICM seen for an acute visit today. Last visit with PCP was on 03/24/19 for chronic care management. He has the following concerns. His head is stopped up, he has headache, rhinorrhea, he thought he had a fever (felt warm but didn't take his temperature) yesterday and symptoms have been present for 2 days with associated post nasal drip Denies GI symptoms, abnormal taste or smell, no appetite, myalgias. enies history of sick contacts. Has not taken any OTC remedies "due to his heart and kidneys and usually gets an antibiotic when he feels this way". The only other person who lives with him is his brother.  Past Medical History:  Diagnosis Date  . AICD (automatic cardioverter/defibrillator) present   . Chronic bronchitis ([place])    "get it most q yr" (12/23/2013)  . Chronic systolic (congestive) heart failure ([place])   . Fracture of left humerus    a. 07/2013.  Marland Kitchen GERD (gastroesophageal reflux disease)    not at present time  . Heart murmur    "born w/it"   . History of renal calculi   . Hypertension   . Kidney stones   . NICM  (nonischemic cardiomyopathy) ([place])    a. 07/2013 Echo: EF 20-25%, diff HK, Gr2 DD, mild MR, mod dil LA/RA. EF 40% 2017 echo  . Obesity   . 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 Syncope   . Type II diabetes mellitus ([place]) 2006   Allergies  Allergen Reactions  . Bee Venom Anaphylaxis    Current Outpatient Medications on File Prior to Visit  Medication Sig Dispense Refill  . ACCU-CHEK FASTCLIX LANCETS MISC Use to check blood sugar 3 times daily. 102 each 11  . aspirin 81 MG tablet Take 81 mg by mouth daily.    Marland Kitchen atorvastatin (LIPITOR) 10 MG tablet Take 1 tablet (10 mg total) by mouth daily. 30 tablet 11  . carvedilol (COREG) 6.25 MG tablet Take 1 tablet (6.25 mg total) by mouth 2 (two) times daily. 180 tablet 3  . cetirizine (ZYRTEC) 10 MG tablet Take 1 tablet (10 mg total) by mouth daily. 30 tablet 0  . ENTRESTO 24-26 MG Take 1 tablet by mouth twice daily 60 tablet 0  . EPINEPHrine 0.3 mg/0.3 mL IJ SOAJ injection Use as directed for severe allergic reactions. 2 Device 1  . fluticasone (FLONASE) 50 MCG/ACT nasal spray Use 1-2 sprays in each nostril once a day as needed for nasal congestion. 16 g 3  . furosemide (LASIX) 80 MG tablet Take  0.5 tablets (40 mg total) by mouth 2 (two) times daily. 60 tablet 0  . gabapentin (NEURONTIN) 300 MG capsule Take 1 capsule (300 mg total) by mouth at bedtime. 90 capsule 2  . glucose blood (ACCU-CHEK GUIDE) test strip Use as instructed to check blood sugar 3 times daily. 100 each 11  . Lancet Devices (ACCU-CHEK SOFTCLIX) lancets 1 each by Other route 3 (three) times daily. 1 each 0  . levalbuterol (XOPENEX HFA) 45 MCG/ACT inhaler Inhale 2 puffs into the lungs every 6 (six) hours as needed for wheezing. 1 Inhaler 1  . sitaGLIPtin (JANUVIA) 25 MG tablet Take 1 tablet (25 mg total) by mouth daily. 90 tablet 2  . tamsulosin (FLOMAX) 0.4 MG  CAPS capsule Take 2 capsules by mouth once daily 60 capsule 2   No current facility-administered medications on file prior to visit.     Observations/Objective: Awake, alert, oriented x3 Sounds congested with nasal speech  Assessment and Plan: 1. Acute non-recurrent maxillary sinusitis Due to underlying co-morbidities I will place him on an antibiotic Advised to increase fluid intake, rest, use Tylenol prn. Will also exclude COVID-19 - Novel Coronavirus, NAA ([facility]); Future - amoxicillin (AMOXIL) 500 MG capsule; Take 1 capsule (500 mg total) by mouth 3 (three) times daily.  Dispense: 30 capsule; Refill: 0   Follow Up Instructions: Keep previously scheduled appointment   I discussed the assessment and treatment plan with the patient. The patient was provided an opportunity to ask questions and all were answered. The patient agreed with the plan and demonstrated an understanding of the instructions.   The patient was advised to call back or seek an in-person evaluation if the symptoms worsen or if the condition fails to improve as anticipated.     I provided 11 minutes total of non-face-to-face time during this encounter including median intraservice time, reviewing previous notes, labs, imaging, medications, management and patient verbalized understanding.     Charlott Rakes, MD, FAAFP. [facility] [place] [facility], [place]   04/07/2019, 8:37 AM

## 2019-04-07 NOTE — Progress Notes (Signed)
Patient having c/o runny nose, sinus pain/pressure, sore throat, hoarse voice, cough, subjective fever x 2 days. States that he can't take OTC medications.

## 2019-04-11 NOTE — Progress Notes (Signed)
No ICM remote transmission received for 04/04/2019 and next ICM transmission scheduled for 05/10/2019.

## 2019-04-16 ENCOUNTER — Other Ambulatory Visit: Payer: Self-pay | Admitting: Cardiology

## 2019-04-16 ENCOUNTER — Other Ambulatory Visit: Payer: Self-pay | Admitting: Internal Medicine

## 2019-04-16 ENCOUNTER — Other Ambulatory Visit: Payer: Self-pay | Admitting: Allergy

## 2019-04-16 DIAGNOSIS — I428 Other cardiomyopathies: Secondary | ICD-10-CM

## 2019-04-16 DIAGNOSIS — N138 Other obstructive and reflux uropathy: Secondary | ICD-10-CM

## 2019-04-16 DIAGNOSIS — N401 Enlarged prostate with lower urinary tract symptoms: Secondary | ICD-10-CM

## 2019-04-16 DIAGNOSIS — I519 Heart disease, unspecified: Secondary | ICD-10-CM

## 2019-04-18 ENCOUNTER — Other Ambulatory Visit: Payer: Self-pay | Admitting: Cardiology

## 2019-04-18 DIAGNOSIS — I519 Heart disease, unspecified: Secondary | ICD-10-CM

## 2019-04-18 DIAGNOSIS — I428 Other cardiomyopathies: Secondary | ICD-10-CM

## 2019-04-18 NOTE — Telephone Encounter (Signed)
° °*  STAT* If patient is at the pharmacy, call can be transferred to refill team.   1. Which medications need to be refilled? (please list name of each medication and dose if known)   ENTRESTO 24-26 MG  2. Which pharmacy/location (including street and city if local pharmacy) is medication to be sent to?  [facility], [address]  3. Do they need a 30 day or 90 day supply? 30 with refills   Patient is out of medication

## 2019-04-20 ENCOUNTER — Other Ambulatory Visit: Payer: Self-pay

## 2019-04-20 ENCOUNTER — Encounter: Payer: Self-pay | Admitting: Allergy

## 2019-04-20 ENCOUNTER — Ambulatory Visit (INDEPENDENT_AMBULATORY_CARE_PROVIDER_SITE_OTHER): Payer: Medicaid Other | Admitting: Allergy

## 2019-04-20 DIAGNOSIS — H1010 Acute atopic conjunctivitis, unspecified eye: Secondary | ICD-10-CM | POA: Diagnosis not present

## 2019-04-20 DIAGNOSIS — J302 Other seasonal allergic rhinitis: Secondary | ICD-10-CM

## 2019-04-20 DIAGNOSIS — J3089 Other allergic rhinitis: Secondary | ICD-10-CM

## 2019-04-20 DIAGNOSIS — L509 Urticaria, unspecified: Secondary | ICD-10-CM

## 2019-04-20 DIAGNOSIS — T63481D Toxic effect of venom of other arthropod, accidental (unintentional), subsequent encounter: Secondary | ICD-10-CM | POA: Diagnosis not present

## 2019-04-20 DIAGNOSIS — T782XXD Anaphylactic shock, unspecified, subsequent encounter: Secondary | ICD-10-CM | POA: Diagnosis not present

## 2019-04-20 MED ORDER — FAMOTIDINE 20 MG PO TABS: 20.0000 mg | ORAL_TABLET | Freq: Every day | ORAL | 0 refills | Status: DC

## 2019-04-20 MED ORDER — CETIRIZINE HCL 10 MG PO TABS: 10.0000 mg | ORAL_TABLET | Freq: Every day | ORAL | 0 refills | Status: DC

## 2019-04-20 MED ORDER — LEVALBUTEROL TARTRATE 45 MCG/ACT IN AERO: 2.0000 | INHALATION_SPRAY | Freq: Four times a day (QID) | RESPIRATORY_TRACT | 1 refills | Status: DC | PRN

## 2019-04-20 NOTE — Progress Notes (Signed)
RE: HRITHIK GOERTZ MRN: [id number] DOB: 11/01/1965 Date of Telemedicine Visit: 04/20/2019  Referring provider: [name], MD Primary care provider: [name], MD  Chief Complaint: Urticaria (quarter+ size)   Telemedicine Follow Up Visit via Telephone: I connected with Ed Anness for a follow up on 04/20/19 by telephone and verified that I am speaking with the correct person using two identifiers.   I discussed the limitations, risks, security and privacy concerns of performing an evaluation and management service by telephone and the availability of in person appointments. I also discussed with the patient that there may be a patient responsible charge related to this service. The patient expressed understanding and agreed to proceed.  Patient is at home/work. Provider is at the office.  Visit start time: 2:29PM Visit end time: 2:54PM Insurance consent/check in by: front desk Medical consent and medical assistant/nurse: Sheliah Plane.  History of Present Illness: He is a 53 y.o. male, who is being followed for urticaria, allergic rhino conjunctivitis, hymenoptera allergy. His previous allergy office visit was on 10/25/2018 with Dr. Maudie Mercury via telemedicine. Today is a regular follow up visit.  Urticaria Patient started to have hives again 3 weeks ago which is mainly red but not pruritic. This is happening all over his body.  Rash occurs for about 30-60 minutes.  No changes in diet, medications, personal care products.  Currently on Zyrtec 10mg  1-2 times a day for the past 1.5 months and he ran out of Pepcid 3 months ago. Being followed by nephrology and was recommended that he takes both medications once a day only. He also had some other adjustments in his medical regimen - either stopped or taking reduced amounts.   Seasonal and perennial allergic rhinoconjunctivitis Stable with zyrtec 10mg  daily and Flonase 1-2 sprays a few times a month with good benefit. No  nosebleeds.  Anaphylaxis due to hymenoptera venom No stings since the last visit.   No inhaler use but would like to have one on hand.  SOB due to his cardiac issues.   Assessment and Plan: Kyla is a 53 y.o. male with: Urticaria Past history - Rash/hives for 3-4 years but worse the last 7 months now having flat rash lasting up to 1 day. Describes them as pruritic and occurring on a daily basis. No triggers identified. Tried benadryl, zyrtec and Claritin with no benefit. Used oral steroids with minimal benefit. Interim history - Was doing well up until 3 weeks ago. Not sure if it's due to running out of Pepcid and/or decreasing his antihistamines as per his nephrologist.   Restart Zyrtec 10mg  once a day at night.   Restart Pepcid 20mg  once a day in the morning.   Take pictures when it flares.  Avoid the following potential triggers: alcohol, tight clothing, NSAIDs.  If still having hives, then we may add another medication called montelukast that does not get cleared by the kidneys.   Follow up with your nephrologist as scheduled. If there's a worsening in your kidney numbers, then we may have to decrease the dosing on the zyrtec and/or Pepcid.   Seasonal and perennial allergic rhinoconjunctivitis Past history - Bloodwork was positive to dust mites, cat, dog, grass. Borderline positive to mold, cockroach and tree pollen. Interim history - stable.   Continue zyrtec 10mg  daily.  May use Flonase 1-2 sprays in each nostril once a day as needed for nasal symptoms.   Continue environmental control measures.  Anaphylaxis due to hymenoptera venom Past history -  Anaphylactic reaction to hymenoptera in the past and apparently was on allergy immunotherapy as a child for this. Most recently got stung a few years ago which required epinephrine use. Bloodwork was borderline positive to paper wasp. Interim history - No additional stings.   Avoid insect stings.  For mild symptoms you can  take over the counter antihistamines such as Benadryl and monitor symptoms closely. If symptoms worsen or if you have severe symptoms including breathing issues, throat closure, significant swelling, whole body hives, severe diarrhea and vomiting, lightheadedness then inject epinephrine and seek immediate medical care afterwards.  Return in about 4 weeks (around 05/18/2019).  Meds ordered this encounter  Medications  . cetirizine (ZYRTEC) 10 MG tablet    Sig: Take 1 tablet (10 mg total) by mouth daily.    Dispense:  90 tablet    Refill:  0  . levalbuterol (XOPENEX HFA) 45 MCG/ACT inhaler    Sig: Inhale 2 puffs into the lungs every 6 (six) hours as needed for wheezing.    Dispense:  1 Inhaler    Refill:  1  . famotidine (PEPCID) 20 MG tablet    Sig: Take 1 tablet (20 mg total) by mouth daily.    Dispense:  90 tablet    Refill:  0   Diagnostics: None.  Medication List:  Current Outpatient Medications  Medication Sig Dispense Refill  . ACCU-CHEK FASTCLIX LANCETS MISC Use to check blood sugar 3 times daily. 102 each 11  . aspirin 81 MG tablet Take 81 mg by mouth daily.    Marland Kitchen atorvastatin (LIPITOR) 10 MG tablet Take 1 tablet (10 mg total) by mouth daily. 30 tablet 11  . carvedilol (COREG) 6.25 MG tablet Take 1 tablet (6.25 mg total) by mouth 2 (two) times daily. 180 tablet 3  . cetirizine (ZYRTEC) 10 MG tablet Take 1 tablet (10 mg total) by mouth daily. 90 tablet 0  . ENTRESTO 24-26 MG Take 1 tablet by mouth twice daily 60 tablet 3  . EPINEPHrine 0.3 mg/0.3 mL IJ SOAJ injection Use as directed for severe allergic reactions. 2 Device 1  . fluticasone (FLONASE) 50 MCG/ACT nasal spray Use 1-2 sprays in each nostril once a day as needed for nasal congestion. 16 g 3  . furosemide (LASIX) 80 MG tablet Take 0.5 tablets (40 mg total) by mouth 2 (two) times daily. 60 tablet 0  . gabapentin (NEURONTIN) 300 MG capsule Take 1 capsule (300 mg total) by mouth at bedtime. 90 capsule 2  . glucose blood  (ACCU-CHEK GUIDE) test strip Use as instructed to check blood sugar 3 times daily. 100 each 11  . Lancet Devices (ACCU-CHEK SOFTCLIX) lancets 1 each by Other route 3 (three) times daily. 1 each 0  . levalbuterol (XOPENEX HFA) 45 MCG/ACT inhaler Inhale 2 puffs into the lungs every 6 (six) hours as needed for wheezing. 1 Inhaler 1  . sitaGLIPtin (JANUVIA) 25 MG tablet Take 1 tablet (25 mg total) by mouth daily. 90 tablet 2  . tamsulosin (FLOMAX) 0.4 MG CAPS capsule Take 2 capsules by mouth once daily 60 capsule 2  . famotidine (PEPCID) 20 MG tablet Take 1 tablet (20 mg total) by mouth daily. 90 tablet 0   No current facility-administered medications for this visit.    Allergies: Allergies  Allergen Reactions  . Bee Venom Anaphylaxis   I reviewed his past medical history, social history, family history, and environmental history and no significant changes have been reported from his previous visit.  Review  of Systems  Constitutional: Negative for appetite change, chills, fever and unexpected weight change.  HENT: Negative for congestion and rhinorrhea.   Eyes: Negative for itching.  Respiratory: Positive for shortness of breath. Negative for cough, chest tightness and wheezing.   Cardiovascular: Negative for chest pain.  Gastrointestinal: Negative for abdominal pain.  Genitourinary: Negative for difficulty urinating.  Skin: Positive for rash.  Allergic/Immunologic: Positive for environmental allergies. Negative for food allergies.  Neurological: Negative for headaches.   Objective: Physical Exam Not obtained as encounter was done via telephone.  Speaking in full sentences without any respiratory distress.  Previous notes and tests were reviewed.  I discussed the assessment and treatment plan with the patient. The patient was provided an opportunity to ask questions and all were answered. The patient agreed with the plan and demonstrated an understanding of the instructions. After  visit summary/patient instructions available via e-mail.   The patient was advised to call back or seek an in-person evaluation if the symptoms worsen or if the condition fails to improve as anticipated.  I provided 25 minutes of non-face-to-face time during this encounter.  It was my pleasure to participate in [place] care today. Please feel free to contact me with any questions or concerns.   Sincerely,  Rexene Alberts, DO Allergy & Immunology  [facility] office: [phone] [facility] office: [place] office: [phone]

## 2019-04-20 NOTE — Assessment & Plan Note (Signed)
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 Most recently got stung a few years ago which required epinephrine use. Bloodwork was borderline positive to paper wasp. Interim history - No additional stings.   Avoid insect stings.  For mild symptoms you can take over the counter antihistamines such as Benadryl and monitor symptoms closely. If symptoms worsen or if you have severe symptoms including breathing issues, throat closure, significant swelling, whole body hives, severe diarrhea and vomiting, lightheadedness then inject epinephrine and seek immediate medical care afterwards.

## 2019-04-20 NOTE — Assessment & Plan Note (Signed)
Past history - Rash/hives for 3-4 years but worse the last 7 months now having flat rash lasting up to 1 day. Describes them as pruritic and occurring on a daily basis. No triggers identified. Tried benadryl, zyrtec and Claritin with no benefit. Used oral steroids with minimal benefit. Interim history - Was doing well up until 3 weeks ago. Not sure if it's due to running out of Pepcid and/or decreasing his antihistamines as per his nephrologist.   Restart Zyrtec 10mg  once a day at night.   Restart Pepcid 20mg  once a day in the morning.   Take pictures when it flares.  Avoid the following potential triggers: alcohol, tight clothing, NSAIDs.  If still having hives, then we may add another medication called montelukast that does not get cleared by the kidneys.   Follow up with your nephrologist as scheduled. If there's a worsening in your kidney numbers, then we may have to decrease the dosing on the zyrtec and/or Pepcid.

## 2019-04-20 NOTE — Assessment & Plan Note (Signed)
Past history - Bloodwork was positive to dust mites, cat, dog, grass. Borderline positive to mold, cockroach and tree pollen. Interim history - stable.   Continue zyrtec 10mg  daily.  May use Flonase 1-2 sprays in each nostril once a day as needed for nasal symptoms.   Continue environmental control measures.

## 2019-04-20 NOTE — Patient Instructions (Addendum)
Urticaria  Restart Zyrtec 10mg  once a day at night.   Restart Pepcid 20mg  once a day in the morning.   Take pictures when it flares.  Avoid the following potential triggers: alcohol, tight clothing, NSAIDs.  If still having hives, then we may add another medication called montelukast that does not get cleared by the kidneys.   Follow up with your nephrologist as scheduled. If there's a worsening in your kidney numbers, then we may have to decrease the dosing on the zyrtec and/or Pepcid.   Seasonal and perennial allergic rhinoconjunctivitis Past history - Bloodwork was positive to dust mites, cat, dog, grass. Borderline positive to mold, cockroach and tree pollen.  Continue zyrtec 10mg  daily.  May use Flonase 1-2 sprays in each nostril once a day as needed for nasal symptoms.   Continue environmental control measures.  Anaphylaxis due to hymenoptera venom Past history - Bloodwork was borderline positive to paper wasp.  Avoid insect stings.  For mild symptoms you can take over the counter antihistamines such as Benadryl and monitor symptoms closely. If symptoms worsen or if you have severe symptoms including breathing issues, throat closure, significant swelling, whole body hives, severe diarrhea and vomiting, lightheadedness then inject epinephrine and seek immediate medical care afterwards.  Follow up in 1 month or sooner if needed.   Sincerely,  Rexene Alberts, DO Allergy & Immunology  [facility] office: [phone] [facility] office: [place] office: [phone]

## 2019-04-23 ENCOUNTER — Other Ambulatory Visit: Payer: Self-pay | Admitting: Internal Medicine

## 2019-05-09 ENCOUNTER — Ambulatory Visit (INDEPENDENT_AMBULATORY_CARE_PROVIDER_SITE_OTHER): Payer: Medicaid Other | Admitting: *Deleted

## 2019-05-09 DIAGNOSIS — I5022 Chronic systolic (congestive) heart failure: Secondary | ICD-10-CM | POA: Diagnosis not present

## 2019-05-10 ENCOUNTER — Ambulatory Visit (INDEPENDENT_AMBULATORY_CARE_PROVIDER_SITE_OTHER): Payer: Medicaid Other

## 2019-05-10 DIAGNOSIS — I5042 Chronic combined systolic (congestive) and diastolic (congestive) heart failure: Secondary | ICD-10-CM | POA: Diagnosis not present

## 2019-05-10 DIAGNOSIS — Z95810 Presence of automatic (implantable) cardiac defibrillator: Secondary | ICD-10-CM | POA: Diagnosis not present

## 2019-05-10 LAB — CUP PACEART REMOTE DEVICE CHECK
Battery Remaining Longevity: 59 mo
Battery Remaining Percentage: 56 %
Battery Voltage: 2.95 V
Brady Statistic RV Percent Paced: 1 %
Date Time Interrogation Session: 20201201112216
HighPow Impedance: 63 Ohm
HighPow Impedance: 63 Ohm
Implantable Lead Implant Date: 20150717
Implantable Lead Location: 753860
Implantable Pulse Generator Implant Date: 20150717
Lead Channel Impedance Value: 310 Ohm
Lead Channel Pacing Threshold Amplitude: 0.75 V
Lead Channel Pacing Threshold Pulse Width: 0.5 ms
Lead Channel Sensing Intrinsic Amplitude: 12 mV
Lead Channel Setting Pacing Amplitude: 2.5 V
Lead Channel Setting Pacing Pulse Width: 0.5 ms
Lead Channel Setting Sensing Sensitivity: 0.5 mV
Pulse Gen Serial Number: 7196009

## 2019-05-10 NOTE — Progress Notes (Signed)
No ICM remote transmission received for 05/10/2019 and next ICM transmission scheduled for 06/13/2019.

## 2019-05-13 NOTE — Progress Notes (Signed)
EPIC Encounter for ICM Monitoring  Patient Name: [name] is a 54 y.o. male Date: 05/13/2019 Primary Care Physican: [name], MD Primary Cardiologist:[place] Electrophysiologist: Allred 12/4/2020Weight 217 lbs  Spoke with patient and is asymptomatic for fluid accumulation.     [place]vue thoracic impedancetrending slightly below baseline normal.  Prescribed dosage:Furosemide 80 mg take 0.5 tablet (40 mg total) twice a day.   Labs: 06/28/2018 Creatinine 1.66, BUN 18, Potassium 4.0, Sodium 142, GFR 46-54 02/26/2018 Creatinine 1.99, BUN 23, Potassium 4.3, Sodium 141, GFR 37-42 Complete set of lab results in result review.  Recommendations: Encouraged to call if experiencing fluid symptoms.  Follow-up plan: ICM clinic phone appointment on 06/13/2019.   91 day device clinic remote transmission 08/08/2019.    Overdue to schedule appointment with either [name] or Chanetta Marshall, NP for EP follow up (last visit 04/19/2018).  Advised patient to call office for EP appointment.  Copy of ICM check sent to Dr. Rayann Heman.     3 month ICM trend: 05/10/2019    1 Year ICM trend:       [name], RN 05/13/2019 10:24 AM

## 2019-05-18 ENCOUNTER — Ambulatory Visit: Payer: Medicaid Other | Admitting: Allergy

## 2019-06-02 DIAGNOSIS — N183 Chronic kidney disease, stage 3 unspecified: Secondary | ICD-10-CM | POA: Diagnosis not present

## 2019-06-07 ENCOUNTER — Other Ambulatory Visit: Payer: Self-pay

## 2019-06-07 ENCOUNTER — Ambulatory Visit: Payer: Medicaid Other | Attending: Internal Medicine

## 2019-06-07 DIAGNOSIS — N529 Male erectile dysfunction, unspecified: Secondary | ICD-10-CM | POA: Diagnosis not present

## 2019-06-08 LAB — TESTOSTERONE: Testosterone: 352 ng/dL (ref 264–916)

## 2019-06-09 ENCOUNTER — Other Ambulatory Visit: Payer: Self-pay | Admitting: Internal Medicine

## 2019-06-14 ENCOUNTER — Telehealth: Payer: Self-pay

## 2019-06-14 NOTE — Telephone Encounter (Signed)
Left message for patient to remind of missed remote transmission.  

## 2019-06-17 ENCOUNTER — Other Ambulatory Visit: Payer: Self-pay

## 2019-06-17 ENCOUNTER — Encounter: Payer: Self-pay | Admitting: Podiatry

## 2019-06-17 ENCOUNTER — Ambulatory Visit: Payer: Medicaid Other | Admitting: Podiatry

## 2019-06-17 DIAGNOSIS — L308 Other specified dermatitis: Secondary | ICD-10-CM

## 2019-06-17 DIAGNOSIS — M79674 Pain in right toe(s): Secondary | ICD-10-CM | POA: Diagnosis not present

## 2019-06-17 DIAGNOSIS — M79675 Pain in left toe(s): Secondary | ICD-10-CM

## 2019-06-17 DIAGNOSIS — B351 Tinea unguium: Secondary | ICD-10-CM

## 2019-06-17 MED ORDER — MOMETASONE FUROATE 0.1 % EX CREA: 1.0000 "application " | TOPICAL_CREAM | Freq: Every day | CUTANEOUS | 1 refills | Status: DC

## 2019-06-17 NOTE — Patient Instructions (Signed)
Diabetes Mellitus and Foot Care Foot care is an important part of your health, especially when you have diabetes. Diabetes may cause you to have problems because of poor blood flow (circulation) to your feet and legs, which can cause your skin to:  Become thinner and drier.  Break more easily.  Heal more slowly.  Peel and crack. You may also have nerve damage (neuropathy) in your legs and feet, causing decreased feeling in them. This means that you may not notice minor injuries to your feet that could lead to more serious problems. Noticing and addressing any potential problems early is the best way to prevent future foot problems. How to care for your feet Foot hygiene  Wash your feet daily with warm water and mild soap. Do not use hot water. Then, pat your feet and the areas between your toes until they are completely dry. Do not soak your feet as this can dry your skin.  Trim your toenails straight across. Do not dig under them or around the cuticle. File the edges of your nails with an emery board or nail file.  Apply a moisturizing lotion or petroleum jelly to the skin on your feet and to dry, brittle toenails. Use lotion that does not contain alcohol and is unscented. Do not apply lotion between your toes. Shoes and socks  Wear clean socks or stockings every day. Make sure they are not too tight. Do not wear knee-high stockings since they may decrease blood flow to your legs.  Wear shoes that fit properly and have enough cushioning. Always look in your shoes before you put them on to be sure there are no objects inside.  To break in new shoes, wear them for just a few hours a day. This prevents injuries on your feet. Wounds, scrapes, corns, and calluses  Check your feet daily for blisters, cuts, bruises, sores, and redness. If you cannot see the bottom of your feet, use a mirror or ask someone for help.  Do not cut corns or calluses or try to remove them with medicine.  If you  find a minor scrape, cut, or break in the skin on your feet, keep it and the skin around it clean and dry. You may clean these areas with mild soap and water. Do not clean the area with peroxide, alcohol, or iodine.  If you have a wound, scrape, corn, or callus on your foot, look at it several times a day to make sure it is healing and not infected. Check for: ? Redness, swelling, or pain. ? Fluid or blood. ? Warmth. ? Pus or a bad smell. General instructions  Do not cross your legs. This may decrease blood flow to your feet.  Do not use heating pads or hot water bottles on your feet. They may burn your skin. If you have lost feeling in your feet or legs, you may not know this is happening until it is too late.  Protect your feet from hot and cold by wearing shoes, such as at the beach or on hot pavement.  Schedule a complete foot exam at least once a year (annually) or more often if you have foot problems. If you have foot problems, report any cuts, sores, or bruises to your health care provider immediately. Contact a health care provider if:  You have a medical condition that increases your risk of infection and you have any cuts, sores, or bruises on your feet.  You have an injury that is not   healing.  You have redness on your legs or feet.  You feel burning or tingling in your legs or feet.  You have pain or cramps in your legs and feet.  Your legs or feet are numb.  Your feet always feel cold.  You have pain around a toenail. Get help right away if:  You have a wound, scrape, corn, or callus on your foot and: ? You have pain, swelling, or redness that gets worse. ? You have fluid or blood coming from the wound, scrape, corn, or callus. ? Your wound, scrape, corn, or callus feels warm to the touch. ? You have pus or a bad smell coming from the wound, scrape, corn, or callus. ? You have a fever. ? You have a red line going up your leg. Summary  Check your feet every day  for cuts, sores, red spots, swelling, and blisters.  Moisturize feet and legs daily.  Wear shoes that fit properly and have enough cushioning.  If you have foot problems, report any cuts, sores, or bruises to your health care provider immediately.  Schedule a complete foot exam at least once a year (annually) or more often if you have foot problems. This information is not intended to replace advice given to you by your health care provider. Make sure you discuss any questions you have with your health care provider. Document Revised: 02/16/2019 Document Reviewed: 06/27/2016 Elsevier Patient Education  2020 [employer] Inc[employer]  

## 2019-06-19 NOTE — Progress Notes (Signed)
Subjective: [name] presents with diabetes, diabetic neuropathy and cc of painful, discolored, thick toenails  which interfere with activities of daily living. Pain is aggravated when wearing enclosed shoe gear. Pain is relieved with periodic professional debridement.  His diabetic neuropathy is managed with Neurontin.   [name] is requesting refill for Elocon Cream for eczema of left great toe.  [name], MD is his PCP. Last visit was 03/24/2019.  Medications reviewed in chart.  Allergies  Allergen Reactions  . Bee Venom Anaphylaxis   Objective: There were no vitals filed for this visit.  Vascular Examination: Capillary refill time immediate x 10 digits.  Dorsalis pedis present b/l.  Posterior tibial pulses present b/l.  Digital hair  present x 10 digits.  Skin temperature gradient WNL b/l.  Dermatological Examination: Skin with normal turgor, texture and tone b/l.  Toenails 1-5 b/l discolored, thick, dystrophic with subungual debris and pain with palpation to nailbeds due to thickness of nails.  Right hallux with enhanced skin tension lines consistent with chronic eczema.   Musculoskeletal: Muscle strength 5/5 to all LE muscle groups.  No pain, crepitus or joint limitation with passive/active ROM.  Neurological: Sensation intact 5/5 with 10 gram monofilament bilaterally.   Assessment: 1. Painful onychomycosis toenails 1-5 b/l 2. Eczema right hallux 3. Resolved plantar fasciitis left foot 4. NIDDM with Diabetic neuropathy  Plan: 1. Continue diabetic foot care principles. Literature dispensed on today. 2. Toenails 1-5 b/l were debrided in length and girth without iatrogenic bleeding. 3. Rx sent to pharmacy for mometasone cream 0.1% to be applied to right great toe once daily.  4. Patient to continue soft, supportive shoe gear. 5. Patient to report any pedal injuries to medical professional. 6. Follow up 3 months.  7. Patient/POA to call  should there be a concern in the interim.

## 2019-06-21 DIAGNOSIS — N400 Enlarged prostate without lower urinary tract symptoms: Secondary | ICD-10-CM | POA: Diagnosis not present

## 2019-06-21 DIAGNOSIS — R42 Dizziness and giddiness: Secondary | ICD-10-CM | POA: Diagnosis not present

## 2019-06-21 DIAGNOSIS — E1122 Type 2 diabetes mellitus with diabetic chronic kidney disease: Secondary | ICD-10-CM | POA: Diagnosis not present

## 2019-06-21 DIAGNOSIS — D649 Anemia, unspecified: Secondary | ICD-10-CM | POA: Diagnosis not present

## 2019-06-21 DIAGNOSIS — N2581 Secondary hyperparathyroidism of renal origin: Secondary | ICD-10-CM | POA: Diagnosis not present

## 2019-06-21 DIAGNOSIS — I428 Other cardiomyopathies: Secondary | ICD-10-CM | POA: Diagnosis not present

## 2019-06-21 DIAGNOSIS — N1832 Chronic kidney disease, stage 3b: Secondary | ICD-10-CM | POA: Diagnosis not present

## 2019-06-26 ENCOUNTER — Encounter: Payer: Self-pay | Admitting: Internal Medicine

## 2019-06-27 ENCOUNTER — Other Ambulatory Visit: Payer: Self-pay

## 2019-06-27 ENCOUNTER — Encounter: Payer: Self-pay | Admitting: Critical Care Medicine

## 2019-06-27 ENCOUNTER — Ambulatory Visit: Payer: Medicaid Other | Attending: Critical Care Medicine | Admitting: Critical Care Medicine

## 2019-06-27 DIAGNOSIS — U071 COVID-19: Secondary | ICD-10-CM | POA: Insufficient documentation

## 2019-06-27 DIAGNOSIS — J0110 Acute frontal sinusitis, unspecified: Secondary | ICD-10-CM | POA: Diagnosis not present

## 2019-06-27 DIAGNOSIS — J209 Acute bronchitis, unspecified: Secondary | ICD-10-CM

## 2019-06-27 DIAGNOSIS — Z20822 Contact with and (suspected) exposure to covid-19: Secondary | ICD-10-CM | POA: Diagnosis not present

## 2019-06-27 MED ORDER — BENZONATATE 200 MG PO CAPS: 200.0000 mg | ORAL_CAPSULE | Freq: Three times a day (TID) | ORAL | 0 refills | Status: DC | PRN

## 2019-06-27 MED ORDER — AMOXICILLIN-POT CLAVULANATE 875-125 MG PO TABS: 1.0000 | ORAL_TABLET | Freq: Two times a day (BID) | ORAL | 0 refills | Status: DC

## 2019-06-27 NOTE — Assessment & Plan Note (Signed)
There is overlap of this patient's symptoms which are likely bronchitis and sinusitis with the possibility of Covid viral syndrome  We will go ahead and test for Covid as well

## 2019-06-27 NOTE — Assessment & Plan Note (Signed)
Asthmatic bronchitic exacerbation as well  Plan here with for the patient will be to begin benzonatate for cough suppression along with the Augmentin

## 2019-06-27 NOTE — Assessment & Plan Note (Signed)
Likely acute sinusitis with associated bronchitis Need to rule out Covid infection  Plan is to prescribe Augmentin 1 twice daily for 7 days

## 2019-06-27 NOTE — Progress Notes (Signed)
Subjective:    Patient ID: [name], male    DOB: January 20, 1966, 54 y.o.   MRN: [id number] Virtual Visit via Telephone Note  I connected with [name] on 06/27/19 at  9:30 AM EST by telephone and verified that I am speaking with the correct person using two identifiers.   Consent:  I discussed the limitations, risks, security and privacy concerns of performing an evaluation and management service by telephone and the availability of in person appointments. I also discussed with the patient that there may be a patient responsible charge related to this service. The patient expressed understanding and agreed to proceed.  Location of patient: The patient was at home  Location of provider: I was in my office  Persons participating in the televisit with the patient.   No one else on the call    History of Present Illness: 54 y.o.M here by a televisit for acute URI syndrome.  The patient has a past history of: NICM, chronic systolic CHF, Vtach, GERD, IBS, T2DM, BPH, obese , ICD,   The patient notes a 2-day history of increased cough productive of thick yellow mucus chest tightness achiness in the stomach muscle aches in the back but no fever.  He has a slight headache.  He states he has sinus pressure over the eyes and behind the eyes.  He has increased sneezing.  He notes fatigue.  He does not have loss of taste or smell.  He denies dizziness.  He is short of breath with exertion.  He feels the shortness of breath is at baseline because of his heart failure.    Past Medical History:  Diagnosis Date  . AICD (automatic cardioverter/defibrillator) present   . Chronic bronchitis ([place])    "get it most q yr" (12/23/2013)  . Chronic systolic (congestive) heart failure ([place])   . Fracture of left humerus    a. 07/2013.  Marland Kitchen GERD (gastroesophageal reflux disease)    not at present time  . Heart murmur    "born w/it"   . History of renal calculi   . Hypertension   . Kidney stones     . NICM (nonischemic cardiomyopathy) ([place])    a. 07/2013 Echo: EF 20-25%, diff HK, Gr2 DD, mild MR, mod dil LA/RA. EF 40% 2017 echo  . Obesity   . 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 Syncope   . Type II diabetes mellitus ([place]) 2006     Family History  Problem Relation Age of Onset  . Diabetes Father   . Arthritis Father   . Hypertension Father   . Diabetes Mother   . Arthritis Mother   . Hypertension Mother   . Hypertension Brother      Social History   Socioeconomic History  . Marital status: Single    Spouse name: Not on file  . Number of children: Not on file  . Years of education: Not on file  . Highest education level: Not on file  Occupational History  . Occupation: Disabled  Tobacco Use  . Smoking status: Former Smoker    Packs/day: 1.00    Years: 28.00    Pack years: 28.00    Types: Cigarettes    Quit date: 06/03/2014    Years since quitting: 5.0  . Smokeless tobacco: Never Used  Substance and Sexual Activity  . Alcohol use: Yes  Alcohol/week: 0.0 standard drinks    Comment: 12/23/2013 "might drink a couple times/yr"  . Drug use: Yes    Types: Marijuana    Comment: 02/25/18- patient  states has not used marjuana  . Sexual activity: Yes  Other Topics Concern  . Not on file  Social History Narrative   Pt lives with his brother    Social Determinants of Health   Financial Resource Strain:   . Difficulty of Paying Living Expenses: Not on file  Food Insecurity:   . Worried About Charity fundraiser in the Last Year: Not on file  . Ran Out of Food in the Last Year: Not on file  Transportation Needs:   . Lack of Transportation (Medical): Not on file  . Lack of Transportation (Non-Medical): Not on file  Physical Activity:   . Days of Exercise per Week: Not on file  . Minutes of Exercise per Session: Not on file  Stress:   .  Feeling of Stress : Not on file  Social Connections:   . Frequency of Communication with Friends and Family: Not on file  . Frequency of Social Gatherings with Friends and Family: Not on file  . Attends Religious Services: Not on file  . Active Member of Clubs or Organizations: Not on file  . Attends Archivist Meetings: Not on file  . Marital Status: Not on file  Intimate Partner Violence:   . Fear of Current or Ex-Partner: Not on file  . Emotionally Abused: Not on file  . Physically Abused: Not on file  . Sexually Abused: Not on file     Allergies  Allergen Reactions  . Bee Venom Anaphylaxis     Outpatient Medications Prior to Visit  Medication Sig Dispense Refill  . ACCU-CHEK FASTCLIX LANCETS MISC Use to check blood sugar 3 times daily. 102 each 11  . aspirin 81 MG tablet Take 81 mg by mouth daily.    Marland Kitchen atorvastatin (LIPITOR) 10 MG tablet Take 1 tablet (10 mg total) by mouth daily. 30 tablet 11  . carvedilol (COREG) 6.25 MG tablet Take 1 tablet (6.25 mg total) by mouth 2 (two) times daily. 180 tablet 3  . cetirizine (ZYRTEC) 10 MG tablet Take 1 tablet (10 mg total) by mouth daily. 90 tablet 0  . cyclobenzaprine (FLEXERIL) 5 MG tablet TAKE 1 TABLET BY MOUTH ONCE DAILY AS NEEDED FOR MUSCLE SPASM 30 tablet 0  . ENTRESTO 24-26 MG Take 1 tablet by mouth twice daily 60 tablet 3  . EPINEPHrine 0.3 mg/0.3 mL IJ SOAJ injection Use as directed for severe allergic reactions. 2 Device 1  . famotidine (PEPCID) 20 MG tablet Take 1 tablet (20 mg total) by mouth daily. 90 tablet 0  . fluticasone (FLONASE) 50 MCG/ACT nasal spray Use 1-2 sprays in each nostril once a day as needed for nasal congestion. 16 g 3  . furosemide (LASIX) 80 MG tablet Take 0.5 tablets (40 mg total) by mouth 2 (two) times daily. 60 tablet 0  . gabapentin (NEURONTIN) 300 MG capsule Take 1 capsule (300 mg total) by mouth at bedtime. 90 capsule 2  . glucose blood (ACCU-CHEK GUIDE) test strip Use as instructed to  check blood sugar 3 times daily. 100 each 11  . Lancet Devices (ACCU-CHEK SOFTCLIX) lancets 1 each by Other route 3 (three) times daily. 1 each 0  . levalbuterol (XOPENEX HFA) 45 MCG/ACT inhaler Inhale 2 puffs into the lungs every 6 (six) hours as needed for wheezing. 1  Inhaler 1  . mometasone (ELOCON) 0.1 % cream Apply 1 application topically daily. 45 g 1  . sitaGLIPtin (JANUVIA) 25 MG tablet Take 1 tablet (25 mg total) by mouth daily. 90 tablet 2  . tamsulosin (FLOMAX) 0.4 MG CAPS capsule Take 2 capsules by mouth once daily 60 capsule 2   No facility-administered medications prior to visit.     Review of Systems  Constitutional: Positive for fatigue. Negative for chills and fever.  HENT: Positive for postnasal drip, rhinorrhea, sinus pressure, sinus pain and sneezing. Negative for congestion, dental problem, drooling, ear discharge, ear pain, facial swelling, hearing loss, mouth sores, nosebleeds, sore throat, tinnitus, trouble swallowing and voice change.   Eyes: Negative.   Respiratory: Positive for cough, chest tightness, shortness of breath and wheezing. Negative for apnea, choking and stridor.   Cardiovascular: Negative for chest pain, palpitations and leg swelling.  Gastrointestinal: Positive for abdominal pain. Negative for diarrhea, nausea and vomiting.  Endocrine: Negative.   Genitourinary: Negative.   Musculoskeletal: Positive for myalgias.  Neurological: Positive for weakness and headaches. Negative for dizziness and seizures.  Hematological: Negative.   Psychiatric/Behavioral: Negative.        Objective:   Physical Exam No exam, phone visit     Assessment & Plan:  I personally reviewed all images and lab data in the [facility] system as well as any outside material available during this office visit and agree with the  radiology impressions.   Acute non-recurrent frontal sinusitis Likely acute sinusitis with associated bronchitis Need to rule out Covid infection  Plan  is to prescribe Augmentin 1 twice daily for 7 days  ASTHMATIC BRONCHITIS, ACUTE Asthmatic bronchitic exacerbation as well  Plan here with for the patient will be to begin benzonatate for cough suppression along with the Augmentin  Encounter by telehealth for suspected COVID-19 There is overlap of this patient's symptoms which are likely bronchitis and sinusitis with the possibility of Covid viral syndrome  We will go ahead and test for Covid as well   [place] was seen today for uri.  Diagnoses and all orders for this visit:  Encounter by telehealth for suspected COVID-19 -     Novel Coronavirus, NAA ([facility])  Acute bronchitis, unspecified organism  Acute non-recurrent frontal sinusitis  Other orders -     amoxicillin-clavulanate (AUGMENTIN) 875-125 MG tablet; Take 1 tablet by mouth 2 (two) times daily. -     benzonatate (TESSALON) 200 MG capsule; Take 1 capsule (200 mg total) by mouth 3 (three) times daily as needed for cough.     Follow Up Instructions: The patient knows a follow-up visit will be arranged as well with his primary care provider I discussed the assessment and treatment plan with the patient. The patient was provided an opportunity to ask questions and all were answered. The patient agreed with the plan and demonstrated an understanding of the instructions.   The patient was advised to call back or seek an in-person evaluation if the symptoms worsen or if the condition fails to improve as anticipated.  I provided 30 minutes of non-face-to-face time during this encounter  including  median intraservice time , review of notes, labs, imaging, medications  and explaining diagnosis and management to the patient .    [name], MD

## 2019-06-27 NOTE — Progress Notes (Signed)
Pt symptoms are: -coughing(yellow phlegm) -headache -pressure behind eyes -runny nose -chills(last night) -chest tightness   Pt denies:  -fever -lost taste or smell -body aches  Pt states he can't take otc medication because of prostate  Pt states he had some left over antibiotics and he took what was left and it did help

## 2019-06-29 ENCOUNTER — Other Ambulatory Visit: Payer: Self-pay | Admitting: Internal Medicine

## 2019-06-29 DIAGNOSIS — E1142 Type 2 diabetes mellitus with diabetic polyneuropathy: Secondary | ICD-10-CM

## 2019-06-30 ENCOUNTER — Ambulatory Visit (HOSPITAL_COMMUNITY)
Admission: EM | Admit: 2019-06-30 | Discharge: 2019-06-30 | Disposition: A | Discharge status: Home / Self Care | Payer: Medicaid Other

## 2019-06-30 ENCOUNTER — Other Ambulatory Visit: Payer: Self-pay

## 2019-07-01 DIAGNOSIS — U071 COVID-19: Secondary | ICD-10-CM | POA: Diagnosis not present

## 2019-07-01 NOTE — Telephone Encounter (Signed)
t called asking for a 90 day supply gabapentin (NEURONTIN) 300 MG capsule [id number]

## 2019-07-04 ENCOUNTER — Encounter: Payer: Self-pay | Admitting: Internal Medicine

## 2019-07-05 ENCOUNTER — Ambulatory Visit: Payer: Medicaid Other | Attending: Critical Care Medicine | Admitting: Critical Care Medicine

## 2019-07-05 ENCOUNTER — Encounter: Payer: Self-pay | Admitting: Critical Care Medicine

## 2019-07-05 DIAGNOSIS — U071 COVID-19: Secondary | ICD-10-CM

## 2019-07-05 NOTE — Progress Notes (Signed)
No ICM remote transmission received for 06/27/2019 and next ICM transmission scheduled for 07/18/2019.   

## 2019-07-05 NOTE — Progress Notes (Signed)
Subjective:    Patient ID: [name], male    DOB: [birth year], 54 y.o.   MRN: [id number] Virtual Visit via Telephone Note  I connected with [name] on 07/05/19 at  1:30 PM EST by telephone and verified that I am speaking with the correct person using two identifiers.   Consent:  I discussed the limitations, risks, security and privacy concerns of performing an evaluation and management service by telephone and the availability of in person appointments. I also discussed with the patient that there may be a patient responsible charge related to this service. The patient expressed understanding and agreed to proceed.  Location of patient: The patient was at home  Location of provider: I was in my office  Persons participating in the televisit with the patient.   No one else on the call    History of Present Illness: 54 y.o.M here by a televisit for acute URI syndrome.  The patient has a past history of: NICM, chronic systolic CHF, Vtach, GERD, IBS, T2DM, BPH, obese , ICD,   The patient notes a 2-day history of increased cough productive of thick yellow mucus chest tightness achiness in the stomach muscle aches in the back but no fever.  He has a slight headache.  He states he has sinus pressure over the eyes and behind the eyes.  He has increased sneezing.  He notes fatigue.  He does not have loss of taste or smell.  He denies dizziness.  He is short of breath with exertion.  He feels the shortness of breath is at baseline because of his heart failure.   07/05/2019 This is a follow-up telehealth visit for this patient by met with by phone on January 18.  The patient subsequently had his Covid test and with positive on January 22.  The patient states he is currently slowly improving at this time.  He states he is still fatigued his cough however is less he is having no fever, he did lose smell but this is now recovering.  He had some diarrhea this is also improving.  He did  have diffuse muscle aches as well.  The patient's results came back in a way that he was too late to receive monoclonal antibody based on his symptom onset having occurred January 11  He has finished his course of Augmentin.    Past Medical History:  Diagnosis Date  . AICD (automatic cardioverter/defibrillator) present   . Chronic bronchitis ([place])    "get it most q yr" (12/23/2013)  . Chronic systolic (congestive) heart failure ([place])   . Fracture of left humerus    a. 07/2013.  Marland Kitchen GERD (gastroesophageal reflux disease)    not at present time  . Heart murmur    "born w/it"   . History of renal calculi   . Hypertension   . Kidney stones   . NICM (nonischemic cardiomyopathy) ([place])    a. 07/2013 Echo: EF 20-25%, diff HK, Gr2 DD, mild MR, mod dil LA/RA. EF 40% 2017 echo  . Obesity   . 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 Syncope   . Type II diabetes mellitus ([place]) 2006     Family History  Problem Relation Age of Onset  . Diabetes Father   . Arthritis Father   . Hypertension Father   . Diabetes Mother   . Arthritis Mother   .  Hypertension Mother   . Hypertension Brother      Social History   Socioeconomic History  . Marital status: Single    Spouse name: Not on file  . Number of children: Not on file  . Years of education: Not on file  . Highest education level: Not on file  Occupational History  . Occupation: Disabled  Tobacco Use  . Smoking status: Former Smoker    Packs/day: 1.00    Years: 28.00    Pack years: 28.00    Types: Cigarettes    Quit date: 06/03/2014    Years since quitting: 5.0  . Smokeless tobacco: Never Used  Substance and Sexual Activity  . Alcohol use: Yes    Alcohol/week: 0.0 standard drinks    Comment: 12/23/2013 "might drink a couple times/yr"  . Drug use: Yes    Types: Marijuana    Comment: 02/25/18- patient  states  has not used marjuana  . Sexual activity: Yes  Other Topics Concern  . Not on file  Social History Narrative   Pt lives with his brother    Social Determinants of Health   Financial Resource Strain:   . Difficulty of Paying Living Expenses: Not on file  Food Insecurity:   . Worried About Charity fundraiser in the Last Year: Not on file  . Ran Out of Food in the Last Year: Not on file  Transportation Needs:   . Lack of Transportation (Medical): Not on file  . Lack of Transportation (Non-Medical): Not on file  Physical Activity:   . Days of Exercise per Week: Not on file  . Minutes of Exercise per Session: Not on file  Stress:   . Feeling of Stress : Not on file  Social Connections:   . Frequency of Communication with Friends and Family: Not on file  . Frequency of Social Gatherings with Friends and Family: Not on file  . Attends Religious Services: Not on file  . Active Member of Clubs or Organizations: Not on file  . Attends Archivist Meetings: Not on file  . Marital Status: Not on file  Intimate Partner Violence:   . Fear of Current or Ex-Partner: Not on file  . Emotionally Abused: Not on file  . Physically Abused: Not on file  . Sexually Abused: Not on file     Allergies  Allergen Reactions  . Bee Venom Anaphylaxis     Outpatient Medications Prior to Visit  Medication Sig Dispense Refill  . ACCU-CHEK FASTCLIX LANCETS MISC Use to check blood sugar 3 times daily. 102 each 11  . amoxicillin-clavulanate (AUGMENTIN) 875-125 MG tablet Take 1 tablet by mouth 2 (two) times daily. 14 tablet 0  . aspirin 81 MG tablet Take 81 mg by mouth daily.    Marland Kitchen atorvastatin (LIPITOR) 10 MG tablet Take 1 tablet (10 mg total) by mouth daily. 30 tablet 11  . benzonatate (TESSALON) 200 MG capsule Take 1 capsule (200 mg total) by mouth 3 (three) times daily as needed for cough. 40 capsule 0  . carvedilol (COREG) 6.25 MG tablet Take 1 tablet (6.25 mg total) by mouth 2 (two) times  daily. 180 tablet 3  . cetirizine (ZYRTEC) 10 MG tablet Take 1 tablet (10 mg total) by mouth daily. 90 tablet 0  . cyclobenzaprine (FLEXERIL) 5 MG tablet TAKE 1 TABLET BY MOUTH ONCE DAILY AS NEEDED FOR MUSCLE SPASM 30 tablet 0  . ENTRESTO 24-26 MG Take 1 tablet by mouth twice daily 60 tablet  3  . EPINEPHrine 0.3 mg/0.3 mL IJ SOAJ injection Use as directed for severe allergic reactions. 2 Device 1  . famotidine (PEPCID) 20 MG tablet Take 1 tablet (20 mg total) by mouth daily. 90 tablet 0  . fluticasone (FLONASE) 50 MCG/ACT nasal spray Use 1-2 sprays in each nostril once a day as needed for nasal congestion. 16 g 3  . furosemide (LASIX) 80 MG tablet Take 0.5 tablets (40 mg total) by mouth 2 (two) times daily. 60 tablet 0  . gabapentin (NEURONTIN) 300 MG capsule Take 1 capsule (300 mg total) by mouth at bedtime. 90 capsule 3  . glucose blood (ACCU-CHEK GUIDE) test strip Use as instructed to check blood sugar 3 times daily. 100 each 11  . Lancet Devices (ACCU-CHEK SOFTCLIX) lancets 1 each by Other route 3 (three) times daily. 1 each 0  . levalbuterol (XOPENEX HFA) 45 MCG/ACT inhaler Inhale 2 puffs into the lungs every 6 (six) hours as needed for wheezing. 1 Inhaler 1  . mometasone (ELOCON) 0.1 % cream Apply 1 application topically daily. 45 g 1  . sitaGLIPtin (JANUVIA) 25 MG tablet Take 1 tablet (25 mg total) by mouth daily. 90 tablet 2  . tamsulosin (FLOMAX) 0.4 MG CAPS capsule Take 2 capsules by mouth once daily 60 capsule 2   No facility-administered medications prior to visit.     Review of Systems  Constitutional: Positive for fatigue. Negative for chills and fever.  HENT: Positive for postnasal drip, rhinorrhea, sinus pressure, sinus pain and sneezing. Negative for congestion, dental problem, drooling, ear discharge, ear pain, facial swelling, hearing loss, mouth sores, nosebleeds, sore throat, tinnitus, trouble swallowing and voice change.   Eyes: Negative.   Respiratory: Positive for  cough, chest tightness, shortness of breath and wheezing. Negative for apnea, choking and stridor.   Cardiovascular: Negative for chest pain, palpitations and leg swelling.  Gastrointestinal: Positive for abdominal pain. Negative for diarrhea, nausea and vomiting.  Endocrine: Negative.   Genitourinary: Negative.   Musculoskeletal: Positive for myalgias.  Neurological: Positive for weakness and headaches. Negative for dizziness and seizures.  Hematological: Negative.   Psychiatric/Behavioral: Negative.        Objective:   Physical Exam No exam, phone visit     Assessment & Plan:  I personally reviewed all images and lab data in the [facility] system as well as any outside material available during this office visit and agree with the  radiology impressions.   COVID-19 virus infection Patient does have positive for Covid infection and has been ill lately since 14 January.  Indicated to him he needs to stay in isolation through January 29 given the protracted nature of his symptoms.  He was given recommendations relative to cleaning his house after his virus condition subsides.  He is outside the window to receive monoclonal antibody infusion  I did recommend he take vitamin D C and zinc supplements and continue to use the benzonatate as needed  Patient will follow up with another visit with his primary care provider the week of February 15   Diagnoses and all orders for this visit:  COVID-19 virus infection     Follow Up Instructions: The patient knows a follow-up visit will be arranged as well with his primary care provider I discussed the assessment and treatment plan with the patient. The patient was provided an opportunity to ask questions and all were answered. The patient agreed with the plan and demonstrated an understanding of the instructions.   The patient was  advised to call back or seek an in-person evaluation if the symptoms worsen or if the condition fails to improve as  anticipated.  I provided 30 minutes of non-face-to-face time during this encounter  including  median intraservice time , review of notes, labs, imaging, medications  and explaining diagnosis and management to the patient .    [name], MD

## 2019-07-05 NOTE — Assessment & Plan Note (Signed)
Patient does have positive for Covid infection and has been ill lately since 14 January.  Indicated to him he needs to stay in isolation through January 29 given the protracted nature of his symptoms.  He was given recommendations relative to cleaning his house after his virus condition subsides.  He is outside the window to receive monoclonal antibody infusion  I did recommend he take vitamin D C and zinc supplements and continue to use the benzonatate as needed  Patient will follow up with another visit with his primary care provider the week of February 15

## 2019-07-14 ENCOUNTER — Other Ambulatory Visit: Payer: Self-pay | Admitting: Internal Medicine

## 2019-07-15 ENCOUNTER — Other Ambulatory Visit: Payer: Self-pay | Admitting: Internal Medicine

## 2019-07-15 DIAGNOSIS — N401 Enlarged prostate with lower urinary tract symptoms: Secondary | ICD-10-CM

## 2019-07-15 DIAGNOSIS — N138 Other obstructive and reflux uropathy: Secondary | ICD-10-CM

## 2019-07-15 MED ORDER — CYCLOBENZAPRINE HCL 5 MG PO TABS: 5.0000 mg | ORAL_TABLET | Freq: Every day | ORAL | 0 refills | Status: DC

## 2019-07-20 NOTE — Progress Notes (Signed)
Virtual Visit via Video Note   This visit type was conducted due to national recommendations for restrictions regarding the COVID-19 Pandemic (e.g. social distancing) in an effort to limit this patient's exposure and mitigate transmission in our community.  Due to his co-morbid illnesses, this patient is at least at moderate risk for complications without adequate follow up.  This format is felt to be most appropriate for this patient at this time.  All issues noted in this document were discussed and addressed.  A limited physical exam was performed with this format.  Please refer to the patient's chart for his consent to telehealth for [facility].     HPI: FU CM/CHF. Echo 2/15 revealed EF of 20-25% with diffuse hypokinesis and biatrial enlargement, mild MR and a small pericardial effusion. TSH in February 2015 normal. Cardiac catheterization in March of 2015 showed normal coronary arteries.Also with prior ICD. CPX August 2017 showed submaximal effort with moderate functional impairment primarily circulatory limited. Echocardiogram repeated September 2020.  Ejection fraction 45 to 50%, moderate left ventricular hypertrophy, grade 1 diastolic dysfunction, mild left atrial enlargement. Since last seen,patient had Covid recently.  He feels weak since then and complains of dyspnea.  He also states it feels like someone is "squeezing his heart at times".  He has not had syncope or pedal edema.  His legs feel weak.  Current Outpatient Medications  Medication Sig Dispense Refill  . ACCU-CHEK FASTCLIX LANCETS MISC Use to check blood sugar 3 times daily. 102 each 11  . amoxicillin-clavulanate (AUGMENTIN) 875-125 MG tablet Take 1 tablet by mouth 2 (two) times daily. 14 tablet 0  . aspirin 81 MG tablet Take 81 mg by mouth daily.    Marland Kitchen atorvastatin (LIPITOR) 10 MG tablet Take 1 tablet (10 mg total) by mouth daily. 30 tablet 11  . benzonatate (TESSALON) 200 MG capsule Take 1 capsule (200 mg total) by  mouth 3 (three) times daily as needed for cough. 40 capsule 0  . carvedilol (COREG) 6.25 MG tablet Take 1 tablet (6.25 mg total) by mouth 2 (two) times daily. 180 tablet 3  . cetirizine (ZYRTEC) 10 MG tablet Take 1 tablet (10 mg total) by mouth daily. 90 tablet 0  . cyclobenzaprine (FLEXERIL) 5 MG tablet Take 1 tablet (5 mg total) by mouth at bedtime. 30 tablet 0  . ENTRESTO 24-26 MG Take 1 tablet by mouth twice daily 60 tablet 3  . EPINEPHrine 0.3 mg/0.3 mL IJ SOAJ injection Use as directed for severe allergic reactions. 2 Device 1  . famotidine (PEPCID) 20 MG tablet Take 1 tablet (20 mg total) by mouth daily. 90 tablet 0  . fluticasone (FLONASE) 50 MCG/ACT nasal spray Use 1-2 sprays in each nostril once a day as needed for nasal congestion. 16 g 3  . furosemide (LASIX) 80 MG tablet Take 0.5 tablets (40 mg total) by mouth 2 (two) times daily. 60 tablet 0  . gabapentin (NEURONTIN) 300 MG capsule Take 1 capsule (300 mg total) by mouth at bedtime. 90 capsule 3  . glucose blood (ACCU-CHEK GUIDE) test strip Use as instructed to check blood sugar 3 times daily. 100 each 11  . Lancet Devices (ACCU-CHEK SOFTCLIX) lancets 1 each by Other route 3 (three) times daily. 1 each 0  . levalbuterol (XOPENEX HFA) 45 MCG/ACT inhaler Inhale 2 puffs into the lungs every 6 (six) hours as needed for wheezing. 1 Inhaler 1  . mometasone (ELOCON) 0.1 % cream Apply 1 application topically daily. 45 g 1  .  sitaGLIPtin (JANUVIA) 25 MG tablet Take 1 tablet (25 mg total) by mouth daily. 90 tablet 2  . tamsulosin (FLOMAX) 0.4 MG CAPS capsule Take 2 capsules by mouth once daily 180 capsule 0   No current facility-administered medications for this visit.     Past Medical History:  Diagnosis Date  . AICD (automatic cardioverter/defibrillator) present   . Chronic bronchitis ([place])    "get it most q yr" (12/23/2013)  . Chronic systolic (congestive) heart failure ([place])   . Fracture of left humerus    a. 07/2013.  Marland Kitchen GERD  (gastroesophageal reflux disease)    not at present time  . Heart murmur    "born w/it"   . History of renal calculi   . Hypertension   . Kidney stones   . NICM (nonischemic cardiomyopathy) ([place])    a. 07/2013 Echo: EF 20-25%, diff HK, Gr2 DD, mild MR, mod dil LA/RA. EF 40% 2017 echo  . Obesity   . 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 Syncope   . Type II diabetes mellitus ([place]) 2006    Past Surgical History:  Procedure Laterality Date  . CARDIAC CATHETERIZATION  08/10/13  . CHOLECYSTECTOMY  ~ 2010  . IMPLANTABLE CARDIOVERTER DEFIBRILLATOR IMPLANT  12/23/2013   STJ Fortify ICD implanted by [name] for cardiomyopathy and syncope  . IMPLANTABLE CARDIOVERTER DEFIBRILLATOR IMPLANT N/A 12/23/2013   Procedure: IMPLANTABLE CARDIOVERTER DEFIBRILLATOR IMPLANT;  Surgeon: Coralyn Mark, MD;  Location: [place] CATH LAB;  Service: Cardiovascular;  Laterality: N/A;  . INGUINAL HERNIA REPAIR Left 03/01/2018   Procedure: OPEN REPAIR OF LEFT INGUINAL HERNIA WITH MESH;  Surgeon: Greer Pickerel, MD;  Location: [facility] ORS;  Service: General;  Laterality: Left;  . LEFT HEART CATHETERIZATION WITH CORONARY ANGIOGRAM N/A 08/10/2013   Procedure: LEFT HEART CATHETERIZATION WITH CORONARY ANGIOGRAM;  Surgeon: Jettie Booze, MD;  Location[facility] LAB;  Service: Cardiovascular;  Laterality: N/A;  . ORIF HUMERUS FRACTURE Left 08/12/2013   Procedure: OPEN REDUCTION INTERNAL FIXATION (ORIF) HUMERAL SHAFT FRACTURE;  Surgeon: Newt Minion, MD;  Location: [place];  Service: Orthopedics;  Laterality: Left;  Open Reduction Internal Fixation Left Humerus  . URETEROSCOPY     "laser for kidney stones"    Social History   Socioeconomic History  . Marital status: Single    Spouse name: Not on file  . Number of children: Not on file  . Years of education: Not on file  . Highest education level: Not on file    Occupational History  . Occupation: Disabled  Tobacco Use  . Smoking status: Former Smoker    Packs/day: 1.00    Years: 28.00    Pack years: 28.00    Types: Cigarettes    Quit date: 06/03/2014    Years since quitting: 5.1  . Smokeless tobacco: Never Used  Substance and Sexual Activity  . Alcohol use: Yes    Alcohol/week: 0.0 standard drinks    Comment: 12/23/2013 "might drink a couple times/yr"  . Drug use: Yes    Types: Marijuana    Comment: 02/25/18- patient  states has not used marjuana  . Sexual activity: Yes  Other Topics Concern  . Not on file  Social History Narrative   Pt lives with his brother    Social Determinants of Health   Financial Resource Strain:   . Difficulty of Paying Living Expenses: Not on file  Food Insecurity:   . Worried About Charity fundraiser in the Last Year: Not on file  . Ran Out of Food in the Last Year: Not on file  Transportation Needs:   . Lack of Transportation (Medical): Not on file  . Lack of Transportation (Non-Medical): Not on file  Physical Activity:   . Days of Exercise per Week: Not on file  . Minutes of Exercise per Session: Not on file  Stress:   . Feeling of Stress : Not on file  Social Connections:   . Frequency of Communication with Friends and Family: Not on file  . Frequency of Social Gatherings with Friends and Family: Not on file  . Attends Religious Services: Not on file  . Active Member of Clubs or Organizations: Not on file  . Attends Archivist Meetings: Not on file  . Marital Status: Not on file  Intimate Partner Violence:   . Fear of Current or Ex-Partner: Not on file  . Emotionally Abused: Not on file  . Physically Abused: Not on file  . Sexually Abused: Not on file    Family History  Problem Relation Age of Onset  . Diabetes Father   . Arthritis Father   . Hypertension Father   . Diabetes Mother   . Arthritis Mother   . Hypertension Mother   . Hypertension Brother     ROS:  Generalized weakness worse in his legs, some dyspnea and chest squeezing.  Remaining systems are negative.  Physical Exam: No acute distress Answers questions appropriately Normal affect Remainder physical examination not performed (telehealth visit; coronavirus pandemic).  A/P  1 chronic systolic congestive heart failure-patient was doing well prior to Covid infection.  He describes some dyspnea which has been chronic for him.  He denies pedal edema.  Continue Lasix at present dose.  Continue fluid restriction and low-sodium diet.  Patient is scheduled to see nephrology in April and we will have results of his potassium and renal function forwarded to [place].  2 nonischemic cardiomyopathy-continue carvedilol and Entresto.  3 history of chronic chest pain-previous catheterization revealed no coronary disease and patient symptoms are chronic and atypical.  We will not pursue further ischemia evaluation.  4 prior ICD-Per EP.  5 chronic stage III kidney disease-Per nephrology.  6 hyperlipidemia-continue Lipitor.  Check lipids and liver.  COVID-19 Education: The importance of social distancing was discussed today.  Time:   Today, I have spent 16 minutes with the patient with telehealth technology discussing the above problems.     Medication Adjustments/Labs and Tests Ordered: Current medicines are reviewed at length with the patient today.  Concerns regarding medicines are outlined above.   Tests Ordered: No orders of the defined types were placed in this encounter.   Medication Changes: No orders of the defined types were placed in this encounter.   Follow Up:  Either In Person or Virtual in 6 month(s)  Signed, [name], MD  07/29/2019 12:55 PM    [place]

## 2019-07-25 ENCOUNTER — Ambulatory Visit: Payer: Medicaid Other | Attending: Internal Medicine | Admitting: Internal Medicine

## 2019-07-25 ENCOUNTER — Other Ambulatory Visit: Payer: Self-pay

## 2019-07-25 DIAGNOSIS — E1142 Type 2 diabetes mellitus with diabetic polyneuropathy: Secondary | ICD-10-CM | POA: Diagnosis not present

## 2019-07-25 DIAGNOSIS — N184 Chronic kidney disease, stage 4 (severe): Secondary | ICD-10-CM | POA: Diagnosis not present

## 2019-07-25 DIAGNOSIS — U071 COVID-19: Secondary | ICD-10-CM

## 2019-07-25 DIAGNOSIS — I5022 Chronic systolic (congestive) heart failure: Secondary | ICD-10-CM

## 2019-07-25 DIAGNOSIS — E1122 Type 2 diabetes mellitus with diabetic chronic kidney disease: Secondary | ICD-10-CM

## 2019-07-25 NOTE — Progress Notes (Signed)
Patient has been called and DOB has been verified. Patient has been screened and transferred to PCP to start phone visit.  Very fatigue, trouble sleeping and sinus infection. Having tightness in his feet.

## 2019-07-25 NOTE — Progress Notes (Signed)
No ICM remote transmission received for 07/18/2019 and next ICM transmission scheduled for 08/09/2019.

## 2019-07-25 NOTE — Progress Notes (Signed)
Virtual Visit via Telephone Note Due to current restrictions/limitations of in-office visits due to the COVID-19 pandemic, this scheduled clinical appointment was converted to a telehealth visit  I connected with [name] on 07/25/19 at  3:50 PM EST by telephone and verified that I am speaking with the correct person using two identifiers. I am in my office.  The patient is at home.  Only the patient and myself participated in this encounter.  I discussed the limitations, risks, security and privacy concerns of performing an evaluation and management service by telephone and the availability of in person appointments. I also discussed with the patient that there may be a patient responsible charge related to this service. The patient expressed understanding and agreed to proceed.   History of Present Illness: Patient with history ofNICM, systolic CHFs/pICD placement,DMtype 2, HL,CKD stage 4,obesity, BPH,GERDand chronicLBPsecondary to spondylosis and spinal stenosis.  Pt had COVID last mth.  He was outside the window of receiving monoclonal antibody therapy. Patient reports that he is feeling better but still has some fatigue, sinus congestion some days.  Feet feel swollen but are not visibly swollen.  He attributes this to Covid also.  He takes Tylenol.  Has Flonase but has not been using it to help with the nasal congestion. -He has not had any fever and cough has resolved.  DM: Taking low-dose Januvia.  He has not been checking his blood sugars in the past several weeks because he has not felt up to it with the fatigue that he has been having from Covid  Systolic CHF: Last saw cardiology Dr. Stanford [name] 02/2019. Reports compliance with medications. No extremity edema.  Shortness of breath off and on.  He sleeps with 6 pillows which is the usual for him.  Current weight is 224 pounds which is unchanged from October.  CKD: Still making good urine.  Not on any NSAIDs.  Has  follow-up appointment with nephrology in April.   Outpatient Encounter Medications as of 07/25/2019  Medication Sig  . ACCU-CHEK FASTCLIX LANCETS MISC Use to check blood sugar 3 times daily.  Marland Kitchen aspirin 81 MG tablet Take 81 mg by mouth daily.  Marland Kitchen atorvastatin (LIPITOR) 10 MG tablet Take 1 tablet (10 mg total) by mouth daily.  . carvedilol (COREG) 6.25 MG tablet Take 1 tablet (6.25 mg total) by mouth 2 (two) times daily.  . cetirizine (ZYRTEC) 10 MG tablet Take 1 tablet (10 mg total) by mouth daily.  . cyclobenzaprine (FLEXERIL) 5 MG tablet Take 1 tablet (5 mg total) by mouth at bedtime.  Marland Kitchen ENTRESTO 24-26 MG Take 1 tablet by mouth twice daily  . EPINEPHrine 0.3 mg/0.3 mL IJ SOAJ injection Use as directed for severe allergic reactions.  . famotidine (PEPCID) 20 MG tablet Take 1 tablet (20 mg total) by mouth daily.  . fluticasone (FLONASE) 50 MCG/ACT nasal spray Use 1-2 sprays in each nostril once a day as needed for nasal congestion.  . furosemide (LASIX) 80 MG tablet Take 0.5 tablets (40 mg total) by mouth 2 (two) times daily.  Marland Kitchen gabapentin (NEURONTIN) 300 MG capsule Take 1 capsule (300 mg total) by mouth at bedtime.  Marland Kitchen glucose blood (ACCU-CHEK GUIDE) test strip Use as instructed to check blood sugar 3 times daily.  Elmore Guise Devices (ACCU-CHEK SOFTCLIX) lancets 1 each by Other route 3 (three) times daily.  Marland Kitchen levalbuterol (XOPENEX HFA) 45 MCG/ACT inhaler Inhale 2 puffs into the lungs every 6 (six) hours as needed for wheezing.  . mometasone (  ELOCON) 0.1 % cream Apply 1 application topically daily.  . sitaGLIPtin (JANUVIA) 25 MG tablet Take 1 tablet (25 mg total) by mouth daily.  . tamsulosin (FLOMAX) 0.4 MG CAPS capsule Take 2 capsules by mouth once daily  . amoxicillin-clavulanate (AUGMENTIN) 875-125 MG tablet Take 1 tablet by mouth 2 (two) times daily. (Patient not taking: Reported on 07/25/2019)  . benzonatate (TESSALON) 200 MG capsule Take 1 capsule (200 mg total) by mouth 3 (three) times daily  as needed for cough. (Patient not taking: Reported on 07/25/2019)   No facility-administered encounter medications on file as of 07/25/2019.    Observations/Objective: No direct observation done as this was a telephone encounter.  Assessment and Plan: 1. COVID-19 virus infection -Patient clinically improving.  He will use the Flonase nasal spray as needed to help with the nasal congestion.  Advised that it can take several weeks for the fatigue to resolve. -Encouraged him to still get the COVID-19 vaccine but may want to wait 2 months as immunity from current infection may start to wean at that time -Discussed importance of social distancing, wearing a mask and frequent handwashing  2. Type 2 diabetes mellitus with diabetic polyneuropathy, without long-term current use of insulin (HCC) Stable.  Continue Januvia  3. Chronic systolic congestive heart failure (HCC) Clinically stable.  Continue carvedilol, furosemide, Entresto.  He plans to schedule a follow-up appointment with his cardiologist.  4. CKD (chronic kidney disease) stage 4, GFR 15-29 ml/min ([place]) Keep upcoming appointment with nephrology   Follow Up Instructions: 3 months   I discussed the assessment and treatment plan with the patient. The patient was provided an opportunity to ask questions and all were answered. The patient agreed with the plan and demonstrated an understanding of the instructions.   The patient was advised to call back or seek an in-person evaluation if the symptoms worsen or if the condition fails to improve as anticipated.  I provided 16 minutes of non-face-to-face time during this encounter.   Karle Plumber, MD

## 2019-07-27 ENCOUNTER — Other Ambulatory Visit: Payer: Self-pay | Admitting: Allergy

## 2019-07-27 MED ORDER — CETIRIZINE HCL 10 MG PO TABS: 10.0000 mg | ORAL_TABLET | Freq: Every day | ORAL | 0 refills | Status: DC

## 2019-07-27 MED ORDER — FAMOTIDINE 20 MG PO TABS: 20.0000 mg | ORAL_TABLET | Freq: Every day | ORAL | 0 refills | Status: DC

## 2019-07-27 NOTE — Telephone Encounter (Signed)
Patient called stating his pharmacy does not have any refills on Zyrtec or Pepcid and he would like a 90 day supply of both. Patient states he is just getting over COVID and is having long lasting effects.  Please advise.  [facility] on [address].

## 2019-07-27 NOTE — Telephone Encounter (Signed)
Pt notified of refills being sent in.

## 2019-07-29 ENCOUNTER — Encounter: Payer: Self-pay | Admitting: Cardiology

## 2019-07-29 ENCOUNTER — Telehealth (INDEPENDENT_AMBULATORY_CARE_PROVIDER_SITE_OTHER): Payer: Medicaid Other | Admitting: Cardiology

## 2019-07-29 VITALS — Ht 72.0 in | Wt 225.6 lb

## 2019-07-29 DIAGNOSIS — I5042 Chronic combined systolic (congestive) and diastolic (congestive) heart failure: Secondary | ICD-10-CM

## 2019-07-29 DIAGNOSIS — E785 Hyperlipidemia, unspecified: Secondary | ICD-10-CM | POA: Diagnosis not present

## 2019-07-29 DIAGNOSIS — N183 Chronic kidney disease, stage 3 unspecified: Secondary | ICD-10-CM | POA: Diagnosis not present

## 2019-07-29 DIAGNOSIS — I5022 Chronic systolic (congestive) heart failure: Secondary | ICD-10-CM

## 2019-07-29 DIAGNOSIS — I130 Hypertensive heart and chronic kidney disease with heart failure and stage 1 through stage 4 chronic kidney disease, or unspecified chronic kidney disease: Secondary | ICD-10-CM

## 2019-07-29 DIAGNOSIS — I428 Other cardiomyopathies: Secondary | ICD-10-CM | POA: Diagnosis not present

## 2019-07-29 DIAGNOSIS — E7800 Pure hypercholesterolemia, unspecified: Secondary | ICD-10-CM

## 2019-07-29 DIAGNOSIS — I10 Essential (primary) hypertension: Secondary | ICD-10-CM

## 2019-07-29 DIAGNOSIS — Z8616 Personal history of COVID-19: Secondary | ICD-10-CM | POA: Diagnosis not present

## 2019-07-29 DIAGNOSIS — Z95810 Presence of automatic (implantable) cardiac defibrillator: Secondary | ICD-10-CM | POA: Diagnosis not present

## 2019-07-29 NOTE — Patient Instructions (Signed)
Medication Instructions:  NO CHANGE *If you need a refill on your cardiac medications before your next appointment, please call your pharmacy*  Lab Work: Your physician recommends that you return for lab work PRIOR TO EATING If you have labs (blood work) drawn today and your tests are completely normal, you will receive your results only by: Marland Kitchen MyChart Message (if you have MyChart) OR . A paper copy in the mail If you have any lab test that is abnormal or we need to change your treatment, we will call you to review the results.  Follow-Up: At [facility], you and your health needs are our priority.  As part of our continuing mission to provide you with exceptional heart care, we have created designated Provider Care Teams.  These Care Teams include your primary Cardiologist (physician) and Advanced Practice Providers (APPs -  Physician Assistants and Nurse Practitioners) who all work together to provide you with the care you need, when you need it.  Your next appointment:   3 month(s)  The format for your next appointment:   In Person  Provider:   [name], MD

## 2019-08-04 ENCOUNTER — Other Ambulatory Visit: Payer: Self-pay | Admitting: Internal Medicine

## 2019-08-04 ENCOUNTER — Other Ambulatory Visit: Payer: Self-pay | Admitting: Pharmacist

## 2019-08-04 ENCOUNTER — Encounter: Payer: Self-pay | Admitting: Internal Medicine

## 2019-08-04 DIAGNOSIS — E118 Type 2 diabetes mellitus with unspecified complications: Secondary | ICD-10-CM

## 2019-08-04 DIAGNOSIS — Z794 Long term (current) use of insulin: Secondary | ICD-10-CM

## 2019-08-04 MED ORDER — SITAGLIPTIN PHOSPHATE 25 MG PO TABS: 25.0000 mg | ORAL_TABLET | Freq: Every day | ORAL | 1 refills | Status: DC

## 2019-08-16 NOTE — Progress Notes (Signed)
No ICM remote transmission received for 08/09/2019 and next ICM transmission scheduled for 09/05/2019.

## 2019-08-18 ENCOUNTER — Other Ambulatory Visit: Payer: Self-pay | Admitting: Family Medicine

## 2019-08-18 ENCOUNTER — Other Ambulatory Visit: Payer: Self-pay | Admitting: Cardiology

## 2019-08-18 DIAGNOSIS — I519 Heart disease, unspecified: Secondary | ICD-10-CM

## 2019-08-18 DIAGNOSIS — I428 Other cardiomyopathies: Secondary | ICD-10-CM

## 2019-08-26 ENCOUNTER — Encounter: Payer: Self-pay | Admitting: Podiatry

## 2019-08-26 ENCOUNTER — Other Ambulatory Visit: Payer: Self-pay

## 2019-08-26 ENCOUNTER — Ambulatory Visit: Payer: Medicaid Other | Admitting: Podiatry

## 2019-08-26 ENCOUNTER — Ambulatory Visit (INDEPENDENT_AMBULATORY_CARE_PROVIDER_SITE_OTHER): Payer: Medicaid Other | Admitting: Podiatry

## 2019-08-26 DIAGNOSIS — M79674 Pain in right toe(s): Secondary | ICD-10-CM | POA: Diagnosis not present

## 2019-08-26 DIAGNOSIS — E1142 Type 2 diabetes mellitus with diabetic polyneuropathy: Secondary | ICD-10-CM

## 2019-08-26 DIAGNOSIS — B351 Tinea unguium: Secondary | ICD-10-CM | POA: Diagnosis not present

## 2019-08-26 DIAGNOSIS — M79675 Pain in left toe(s): Secondary | ICD-10-CM

## 2019-08-26 NOTE — Patient Instructions (Signed)
Diabetes Mellitus and Foot Care Foot care is an important part of your health, especially when you have diabetes. Diabetes may cause you to have problems because of poor blood flow (circulation) to your feet and legs, which can cause your skin to:  Become thinner and drier.  Break more easily.  Heal more slowly.  Peel and crack. You may also have nerve damage (neuropathy) in your legs and feet, causing decreased feeling in them. This means that you may not notice minor injuries to your feet that could lead to more serious problems. Noticing and addressing any potential problems early is the best way to prevent future foot problems. How to care for your feet Foot hygiene  Wash your feet daily with warm water and mild soap. Do not use hot water. Then, pat your feet and the areas between your toes until they are completely dry. Do not soak your feet as this can dry your skin.  Trim your toenails straight across. Do not dig under them or around the cuticle. File the edges of your nails with an emery board or nail file.  Apply a moisturizing lotion or petroleum jelly to the skin on your feet and to dry, brittle toenails. Use lotion that does not contain alcohol and is unscented. Do not apply lotion between your toes. Shoes and socks  Wear clean socks or stockings every day. Make sure they are not too tight. Do not wear knee-high stockings since they may decrease blood flow to your legs.  Wear shoes that fit properly and have enough cushioning. Always look in your shoes before you put them on to be sure there are no objects inside.  To break in new shoes, wear them for just a few hours a day. This prevents injuries on your feet. Wounds, scrapes, corns, and calluses  Check your feet daily for blisters, cuts, bruises, sores, and redness. If you cannot see the bottom of your feet, use a mirror or ask someone for help.  Do not cut corns or calluses or try to remove them with medicine.  If you  find a minor scrape, cut, or break in the skin on your feet, keep it and the skin around it clean and dry. You may clean these areas with mild soap and water. Do not clean the area with peroxide, alcohol, or iodine.  If you have a wound, scrape, corn, or callus on your foot, look at it several times a day to make sure it is healing and not infected. Check for: ? Redness, swelling, or pain. ? Fluid or blood. ? Warmth. ? Pus or a bad smell. General instructions  Do not cross your legs. This may decrease blood flow to your feet.  Do not use heating pads or hot water bottles on your feet. They may burn your skin. If you have lost feeling in your feet or legs, you may not know this is happening until it is too late.  Protect your feet from hot and cold by wearing shoes, such as at the beach or on hot pavement.  Schedule a complete foot exam at least once a year (annually) or more often if you have foot problems. If you have foot problems, report any cuts, sores, or bruises to your health care provider immediately. Contact a health care provider if:  You have a medical condition that increases your risk of infection and you have any cuts, sores, or bruises on your feet.  You have an injury that is not   healing.  You have redness on your legs or feet.  You feel burning or tingling in your legs or feet.  You have pain or cramps in your legs and feet.  Your legs or feet are numb.  Your feet always feel cold.  You have pain around a toenail. Get help right away if:  You have a wound, scrape, corn, or callus on your foot and: ? You have pain, swelling, or redness that gets worse. ? You have fluid or blood coming from the wound, scrape, corn, or callus. ? Your wound, scrape, corn, or callus feels warm to the touch. ? You have pus or a bad smell coming from the wound, scrape, corn, or callus. ? You have a fever. ? You have a red line going up your leg. Summary  Check your feet every day  for cuts, sores, red spots, swelling, and blisters.  Moisturize feet and legs daily.  Wear shoes that fit properly and have enough cushioning.  If you have foot problems, report any cuts, sores, or bruises to your health care provider immediately.  Schedule a complete foot exam at least once a year (annually) or more often if you have foot problems. This information is not intended to replace advice given to you by your health care provider. Make sure you discuss any questions you have with your health care provider. Document Revised: 02/16/2019 Document Reviewed: 06/27/2016 Elsevier Patient Education  2020 [employer] Inc[employer]  

## 2019-09-01 NOTE — Progress Notes (Signed)
Subjective: [name] presents today for follow up of preventative diabetic foot care and painful mycotic nails b/l that are difficult to trim. Pain interferes with ambulation. Aggravating factors include wearing enclosed shoe gear. Pain is relieved with periodic professional debridement.   He also has eczema of right hallux and neuropathic pain. Neuropathy is managed with gabapentin and eczema is controlled with Elocon Cream.  He voices no new pedal concerns on today's visit. His mother also has an appointment today and he serves as her primary caretaker.  Allergies  Allergen Reactions  . Bee Venom Anaphylaxis    Objective: There were no vitals filed for this visit.  Pt 54 y.o. year old AA male WD, WN  in NAD. AAO x 3.   Vascular Examination:  Capillary refill time to digits immediate b/l. Palpable DP pulses b/l. Palpable PT pulses b/l. Pedal hair present b/l. Skin temperature gradient within normal limits b/l.  Dermatological Examination: Pedal skin with normal turgor, texture and tone bilaterally. No open wounds bilaterally. No interdigital macerations bilaterally. Toenails 1-5 b/l elongated, dystrophic, thickened, crumbly with subungual debris and tenderness to dorsal palpation.  Musculoskeletal: Normal muscle strength 5/5 to all lower extremity muscle groups bilaterally, no gross bony deformities bilaterally, no pain crepitus or joint limitation noted with ROM b/l and patient ambulates independent of any assistive aids  Neurological: Protective sensation intact 5/5 intact bilaterally with 10g monofilament b/l Vibratory sensation intact b/l  Assessment: 1. Pain due to onychomycosis of toenails of both feet   2. Diabetic peripheral neuropathy associated with type 2 diabetes mellitus ([name])    Plan: -Continue diabetic foot care principles. Literature dispensed on today.  -Toenails 1-5 b/l were debrided in length and girth with sterile nail nippers and dremel without iatrogenic  bleeding.  -Patient to continue soft, supportive shoe gear daily. -Patient to report any pedal injuries to medical professional immediately. -Patient/POA to call should there be question/concern in the interim.  Return in about 3 months (around 11/26/2019).

## 2019-09-04 ENCOUNTER — Encounter: Payer: Self-pay | Admitting: Internal Medicine

## 2019-09-05 DIAGNOSIS — H538 Other visual disturbances: Secondary | ICD-10-CM | POA: Diagnosis not present

## 2019-09-05 DIAGNOSIS — N1832 Chronic kidney disease, stage 3b: Secondary | ICD-10-CM | POA: Diagnosis not present

## 2019-09-05 DIAGNOSIS — H40023 Open angle with borderline findings, high risk, bilateral: Secondary | ICD-10-CM | POA: Diagnosis not present

## 2019-09-08 ENCOUNTER — Encounter: Payer: Self-pay | Admitting: Internal Medicine

## 2019-09-08 DIAGNOSIS — E1142 Type 2 diabetes mellitus with diabetic polyneuropathy: Secondary | ICD-10-CM

## 2019-09-08 MED ORDER — GABAPENTIN 300 MG PO CAPS: 300.0000 mg | ORAL_CAPSULE | Freq: Every day | ORAL | 3 refills | Status: DC

## 2019-09-12 ENCOUNTER — Ambulatory Visit (INDEPENDENT_AMBULATORY_CARE_PROVIDER_SITE_OTHER): Payer: Medicaid Other | Admitting: *Deleted

## 2019-09-12 DIAGNOSIS — I5022 Chronic systolic (congestive) heart failure: Secondary | ICD-10-CM

## 2019-09-12 LAB — CUP PACEART REMOTE DEVICE CHECK
Battery Remaining Longevity: 56 mo
Battery Remaining Percentage: 53 %
Battery Voltage: 2.93 V
Brady Statistic RV Percent Paced: 1 %
Date Time Interrogation Session: 20210405061322
HighPow Impedance: 61 Ohm
HighPow Impedance: 61 Ohm
Implantable Lead Implant Date: 20150717
Implantable Lead Location: 753860
Implantable Pulse Generator Implant Date: 20150717
Lead Channel Impedance Value: 310 Ohm
Lead Channel Pacing Threshold Amplitude: 0.75 V
Lead Channel Pacing Threshold Pulse Width: 0.5 ms
Lead Channel Sensing Intrinsic Amplitude: 11.8 mV
Lead Channel Setting Pacing Amplitude: 2.5 V
Lead Channel Setting Pacing Pulse Width: 0.5 ms
Lead Channel Setting Sensing Sensitivity: 0.5 mV
Pulse Gen Serial Number: 7196009

## 2019-09-13 DIAGNOSIS — R42 Dizziness and giddiness: Secondary | ICD-10-CM | POA: Diagnosis not present

## 2019-09-13 DIAGNOSIS — N1831 Chronic kidney disease, stage 3a: Secondary | ICD-10-CM | POA: Diagnosis not present

## 2019-09-13 DIAGNOSIS — E1122 Type 2 diabetes mellitus with diabetic chronic kidney disease: Secondary | ICD-10-CM | POA: Diagnosis not present

## 2019-09-13 DIAGNOSIS — I428 Other cardiomyopathies: Secondary | ICD-10-CM | POA: Diagnosis not present

## 2019-09-13 DIAGNOSIS — N400 Enlarged prostate without lower urinary tract symptoms: Secondary | ICD-10-CM | POA: Diagnosis not present

## 2019-09-13 DIAGNOSIS — D649 Anemia, unspecified: Secondary | ICD-10-CM | POA: Diagnosis not present

## 2019-09-13 DIAGNOSIS — N2581 Secondary hyperparathyroidism of renal origin: Secondary | ICD-10-CM | POA: Diagnosis not present

## 2019-09-14 NOTE — Progress Notes (Signed)
ICD Remote  

## 2019-09-16 ENCOUNTER — Ambulatory Visit (INDEPENDENT_AMBULATORY_CARE_PROVIDER_SITE_OTHER): Payer: Medicaid Other

## 2019-09-16 DIAGNOSIS — Z95810 Presence of automatic (implantable) cardiac defibrillator: Secondary | ICD-10-CM | POA: Diagnosis not present

## 2019-09-16 DIAGNOSIS — I5022 Chronic systolic (congestive) heart failure: Secondary | ICD-10-CM

## 2019-09-16 NOTE — Progress Notes (Signed)
EPIC Encounter for ICM Monitoring  Patient Name: [name] is a 54 y.o. male Date: 09/16/2019 Primary Care Physican: [name], MD Primary Cardiologist:[place] Electrophysiologist: Allred 2/19/2021Weight225 lbs  Transmission Reviewed.  [place]vue thoracic impedancenormal.  Prescribed dosage:Furosemide 80 mg take 0.5 tablet (40 mg total) twice a day.   Labs: 06/28/2018 Creatinine 1.66, BUN 18, Potassium 4.0, Sodium 142, GFR 46-54 02/26/2018 Creatinine 1.99, BUN 23, Potassium 4.3, Sodium 141, GFR 37-42 Complete set of lab results in result review.  Recommendations:  None  Follow-up plan: ICM clinic phone appointment on 10/18/2019.   91 day device clinic remote transmission 12/14/2019.    Copy of ICM check sent to Dr. Rayann Heman.     3 month ICM trend: 09/12/2019    1 Year ICM trend:       [name], RN 09/16/2019 3:53 PM

## 2019-09-23 ENCOUNTER — Encounter: Payer: Self-pay | Admitting: Internal Medicine

## 2019-09-23 ENCOUNTER — Other Ambulatory Visit: Payer: Self-pay | Admitting: Internal Medicine

## 2019-09-23 DIAGNOSIS — I5042 Chronic combined systolic (congestive) and diastolic (congestive) heart failure: Secondary | ICD-10-CM

## 2019-09-23 MED ORDER — FUROSEMIDE 80 MG PO TABS: 80.0000 mg | ORAL_TABLET | Freq: Two times a day (BID) | ORAL | 1 refills | Status: DC

## 2019-10-07 ENCOUNTER — Encounter: Payer: Self-pay | Admitting: *Deleted

## 2019-10-14 DIAGNOSIS — I5042 Chronic combined systolic (congestive) and diastolic (congestive) heart failure: Secondary | ICD-10-CM | POA: Diagnosis not present

## 2019-10-14 DIAGNOSIS — E7800 Pure hypercholesterolemia, unspecified: Secondary | ICD-10-CM | POA: Diagnosis not present

## 2019-10-15 LAB — COMPREHENSIVE METABOLIC PANEL
ALT: 13 IU/L (ref 0–44)
AST: 15 IU/L (ref 0–40)
Albumin/Globulin Ratio: 1.6 (ref 1.2–2.2)
Albumin: 4.6 g/dL (ref 3.8–4.9)
Alkaline Phosphatase: 74 IU/L (ref 39–117)
BUN/Creatinine Ratio: 7 — ABNORMAL LOW (ref 9–20)
BUN: 11 mg/dL (ref 6–24)
Bilirubin Total: 0.4 mg/dL (ref 0.0–1.2)
CO2: 25 mmol/L (ref 20–29)
Calcium: 9.7 mg/dL (ref 8.7–10.2)
Chloride: 104 mmol/L (ref 96–106)
Creatinine, Ser: 1.51 mg/dL — ABNORMAL HIGH (ref 0.76–1.27)
GFR calc Af Amer: 60 mL/min/{1.73_m2} (ref >59)
GFR calc non Af Amer: 52 mL/min/{1.73_m2} — ABNORMAL LOW (ref >59)
Globulin, Total: 2.8 g/dL (ref 1.5–4.5)
Glucose: 130 mg/dL — ABNORMAL HIGH (ref 65–99)
Potassium: 3.8 mmol/L (ref 3.5–5.2)
Sodium: 148 mmol/L — ABNORMAL HIGH (ref 134–144)
Total Protein: 7.4 g/dL (ref 6.0–8.5)

## 2019-10-15 LAB — LIPID PANEL
Chol/HDL Ratio: 2.6 ratio (ref 0.0–5.0)
Cholesterol, Total: 115 mg/dL (ref 100–199)
HDL: 44 mg/dL (ref >39)
LDL Chol Calc (NIH): 57 mg/dL (ref 0–99)
Triglycerides: 66 mg/dL (ref 0–149)
VLDL Cholesterol Cal: 14 mg/dL (ref 5–40)

## 2019-10-17 ENCOUNTER — Encounter: Payer: Self-pay | Admitting: Internal Medicine

## 2019-10-18 ENCOUNTER — Other Ambulatory Visit: Payer: Self-pay | Admitting: Internal Medicine

## 2019-10-18 DIAGNOSIS — N401 Enlarged prostate with lower urinary tract symptoms: Secondary | ICD-10-CM

## 2019-10-18 DIAGNOSIS — N138 Other obstructive and reflux uropathy: Secondary | ICD-10-CM

## 2019-10-19 ENCOUNTER — Telehealth: Payer: Self-pay

## 2019-10-19 ENCOUNTER — Encounter: Payer: Self-pay | Admitting: Internal Medicine

## 2019-10-19 ENCOUNTER — Other Ambulatory Visit: Payer: Self-pay | Admitting: Pharmacist

## 2019-10-19 ENCOUNTER — Telehealth: Payer: Self-pay | Admitting: Internal Medicine

## 2019-10-19 DIAGNOSIS — N401 Enlarged prostate with lower urinary tract symptoms: Secondary | ICD-10-CM

## 2019-10-19 MED ORDER — TAMSULOSIN HCL 0.4 MG PO CAPS: 0.8000 mg | ORAL_CAPSULE | Freq: Every day | ORAL | 0 refills | Status: DC

## 2019-10-19 NOTE — Telephone Encounter (Signed)
Left message for patient to remind of missed remote transmission.  

## 2019-10-19 NOTE — Telephone Encounter (Signed)
Patient called in and requested for listed medications to be refilled and sent to [facility], [address], [place] [address] tamsulosin (FLOMAX) 0.4 MG CAPS capsule [[id number]]

## 2019-10-19 NOTE — Telephone Encounter (Signed)
Rx sent 

## 2019-10-21 ENCOUNTER — Encounter: Payer: Self-pay | Admitting: Internal Medicine

## 2019-10-21 ENCOUNTER — Other Ambulatory Visit: Payer: Self-pay | Admitting: Internal Medicine

## 2019-10-21 DIAGNOSIS — Z23 Encounter for immunization: Secondary | ICD-10-CM | POA: Diagnosis not present

## 2019-10-21 MED ORDER — CYCLOBENZAPRINE HCL 5 MG PO TABS: 5.0000 mg | ORAL_TABLET | Freq: Every day | ORAL | 1 refills | Status: DC

## 2019-10-21 NOTE — Progress Notes (Signed)
No ICM remote transmission received for 10/18/2019 and next ICM transmission scheduled for 11/15/2019.

## 2019-10-24 ENCOUNTER — Encounter: Payer: Self-pay | Admitting: Internal Medicine

## 2019-10-24 ENCOUNTER — Other Ambulatory Visit: Payer: Self-pay

## 2019-10-24 ENCOUNTER — Ambulatory Visit: Payer: Medicaid Other | Attending: Internal Medicine | Admitting: Internal Medicine

## 2019-10-24 VITALS — BP 103/70 | HR 83 | Temp 97.0°F | Resp 16 | Wt 236.2 lb

## 2019-10-24 DIAGNOSIS — Z95810 Presence of automatic (implantable) cardiac defibrillator: Secondary | ICD-10-CM | POA: Insufficient documentation

## 2019-10-24 DIAGNOSIS — Z833 Family history of diabetes mellitus: Secondary | ICD-10-CM | POA: Insufficient documentation

## 2019-10-24 DIAGNOSIS — E669 Obesity, unspecified: Secondary | ICD-10-CM | POA: Diagnosis not present

## 2019-10-24 DIAGNOSIS — I429 Cardiomyopathy, unspecified: Secondary | ICD-10-CM | POA: Insufficient documentation

## 2019-10-24 DIAGNOSIS — E6609 Other obesity due to excess calories: Secondary | ICD-10-CM | POA: Diagnosis not present

## 2019-10-24 DIAGNOSIS — Z79899 Other long term (current) drug therapy: Secondary | ICD-10-CM | POA: Insufficient documentation

## 2019-10-24 DIAGNOSIS — E118 Type 2 diabetes mellitus with unspecified complications: Secondary | ICD-10-CM

## 2019-10-24 DIAGNOSIS — N182 Chronic kidney disease, stage 2 (mild): Secondary | ICD-10-CM | POA: Insufficient documentation

## 2019-10-24 DIAGNOSIS — K219 Gastro-esophageal reflux disease without esophagitis: Secondary | ICD-10-CM | POA: Insufficient documentation

## 2019-10-24 DIAGNOSIS — Z6832 Body mass index (BMI) 32.0-32.9, adult: Secondary | ICD-10-CM | POA: Diagnosis not present

## 2019-10-24 DIAGNOSIS — I5022 Chronic systolic (congestive) heart failure: Secondary | ICD-10-CM | POA: Diagnosis not present

## 2019-10-24 DIAGNOSIS — K589 Irritable bowel syndrome without diarrhea: Secondary | ICD-10-CM | POA: Diagnosis not present

## 2019-10-24 DIAGNOSIS — Z87891 Personal history of nicotine dependence: Secondary | ICD-10-CM | POA: Insufficient documentation

## 2019-10-24 DIAGNOSIS — E1142 Type 2 diabetes mellitus with diabetic polyneuropathy: Secondary | ICD-10-CM | POA: Diagnosis not present

## 2019-10-24 DIAGNOSIS — Z7982 Long term (current) use of aspirin: Secondary | ICD-10-CM | POA: Insufficient documentation

## 2019-10-24 DIAGNOSIS — I130 Hypertensive heart and chronic kidney disease with heart failure and stage 1 through stage 4 chronic kidney disease, or unspecified chronic kidney disease: Secondary | ICD-10-CM | POA: Insufficient documentation

## 2019-10-24 DIAGNOSIS — Z8249 Family history of ischemic heart disease and other diseases of the circulatory system: Secondary | ICD-10-CM | POA: Insufficient documentation

## 2019-10-24 DIAGNOSIS — Z794 Long term (current) use of insulin: Secondary | ICD-10-CM | POA: Diagnosis not present

## 2019-10-24 DIAGNOSIS — Z8616 Personal history of COVID-19: Secondary | ICD-10-CM | POA: Insufficient documentation

## 2019-10-24 LAB — POCT GLYCOSYLATED HEMOGLOBIN (HGB A1C): HbA1c, POC (controlled diabetic range): 7.5 % — AB (ref 0.0–7.0)

## 2019-10-24 LAB — GLUCOSE, POCT (MANUAL RESULT ENTRY): POC Glucose: 183 mg/dl — AB (ref 70–99)

## 2019-10-24 NOTE — Patient Instructions (Signed)

## 2019-10-24 NOTE — Progress Notes (Signed)
Patient ID: [name], male    DOB: Oct 22, 1965  MRN: [id number]  CC: Diabetes and Hypertension   Subjective: [name] is a 54 y.o. male who presents for chronic ds management His concerns today include:  Patient with history ofNICM, systolic CHFs/pICD placement,DMtype 2, HL,CKD stage4,obesity, BPH,GERDand chronicLBPsecondary to spondylosis and spinal stenosis, COVID-19 infection.  Obesity: gained wgh due to stress.  Eating more.  Small snack during the day and eats late at nights. Mom has alzhemier's ds.  Father recently had intracranial bleed.  Gained 12 lbs since we last spoke in 07/2019.   -took 1st Gates vaccine 10/21/2019 and was sick.  2nd due 11/18/2019.  -walking 3-4 miles a day  DM:  Lab Results  Component Value Date   HGBA1C 7.5 (A) 10/24/2019  taking Januvia 25 mg daily checking BS 3 x a wk.  Range 140-160 in mornings -no numbness in feet.  Little pain RT foot anterior to arch when he runs  HYPERTENSION/NICM/sCHF ICD Currently taking: see medication list.  He is on carvedilol, furosemide, Entresto Med Adherence: [x]  Yes    []  No Medication side effects: []  Yes    [x]  No Adherence with salt restriction: [x]  Yes    []  No Home Monitoring?: []  Yes    []  No Monitoring Frequency: []  Yes    []  No Home BP results range: []  Yes    []  No SOB? [x]  Yes since COVID    []  No Chest Pain?: [x]  Yes -chronic.  No firing of ICD  Leg swelling?: []  Yes    [x]  No Headaches?: []  Yes    [x]  No Dizziness? [x]  Yes sometimes    []  No Comments:   CKD: Recent blood test done by his cardiologist [name] reveals that his creatinine is improving.  Creatinine was 1.51 and GFR now is at 60.  This is a significant improvement compared to last year.  Patient Active Problem List   Diagnosis Date Noted  . Acute non-recurrent frontal sinusitis 06/27/2019  . COVID-19 virus infection 06/27/2019  . Glaucoma suspect 12/22/2018  . Seasonal and perennial allergic  rhinoconjunctivitis 10/25/2018  . 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 Urticaria 05/03/2018  . S/P inguinal hernia repair 03/01/2018  . Unilateral inguinal hernia without obstruction or gangrene 07/24/2017  . 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  . Hypersomnia 02/28/2016  . Abnormal PFT 02/24/2016  . Diabetic polyneuropathy associated with type 2 diabetes mellitus ([place]) 12/18/2015  . Lumbar back pain 12/18/2015  . Controlled type 2 diabetes mellitus with complication, with long-term current use of insulin ([place]) 09/17/2015  . 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 Lesion of right median nerve at forearm 04/16/2015  . ICD (implantable cardioverter-defibrillator) in place 03/28/2014  . Chronic systolic congestive heart failure ([place]) 03/28/2014  . Nonischemic cardiomyopathy ([place]) 11/09/2013  . Syncope 10/20/2013  . Chronic chest pain 07/28/2013  . RENAL CALCULUS 05/13/2009  . Obesity 05/08/2009  . Former heavy cigarette smoker (20-39 per day) 05/08/2009  . HOARSENESS 05/08/2009  . ANXIETY 02/28/2008  . ASTHMATIC BRONCHITIS, ACUTE 02/28/2008  . GERD 02/28/2008  . IRRITABLE BOWEL SYNDROME 02/28/2008     Current Outpatient Medications on File Prior to Visit  Medication Sig Dispense Refill  . ACCU-CHEK FASTCLIX LANCETS MISC Use to check blood sugar 3 times daily. 102 each 11  . aspirin 81 MG tablet Take 81 mg by mouth daily.    Marland Kitchen atorvastatin (LIPITOR) 10 MG tablet Take 1 tablet (  10 mg total) by mouth daily. 30 tablet 11  . benzonatate (TESSALON) 200 MG capsule Take 1 capsule (200 mg total) by mouth 3 (three) times daily as needed for cough. 40 capsule 0  . carvedilol (COREG) 6.25 MG tablet Take 1 tablet (6.25 mg total) by mouth 2 (two) times daily. 180 tablet 3  . cetirizine (ZYRTEC) 10 MG tablet Take 1 tablet (10 mg total) by mouth daily. 90 tablet 0  . cyclobenzaprine (FLEXERIL) 5 MG tablet Take 1 tablet (5 mg total) by mouth at bedtime. 30  tablet 1  . ENTRESTO 24-26 MG Take 1 tablet by mouth twice daily 60 tablet 10  . EPINEPHrine 0.3 mg/0.3 mL IJ SOAJ injection Use as directed for severe allergic reactions. 2 Device 1  . famotidine (PEPCID) 20 MG tablet Take 1 tablet (20 mg total) by mouth daily. 90 tablet 0  . fluticasone (FLONASE) 50 MCG/ACT nasal spray Use 1-2 sprays in each nostril once a day as needed for nasal congestion. 16 g 3  . furosemide (LASIX) 80 MG tablet Take 1 tablet (80 mg total) by mouth 2 (two) times daily. 180 tablet 1  . gabapentin (NEURONTIN) 300 MG capsule Take 1 capsule (300 mg total) by mouth at bedtime. 90 capsule 3  . glucose blood (ACCU-CHEK GUIDE) test strip Use as instructed to check blood sugar 3 times daily. 100 each 11  . Lancet Devices (ACCU-CHEK SOFTCLIX) lancets 1 each by Other route 3 (three) times daily. 1 each 0  . levalbuterol (XOPENEX HFA) 45 MCG/ACT inhaler Inhale 2 puffs into the lungs every 6 (six) hours as needed for wheezing. 1 Inhaler 1  . mometasone (ELOCON) 0.1 % cream Apply 1 application topically daily. 45 g 1  . sitaGLIPtin (JANUVIA) 25 MG tablet Take 1 tablet (25 mg total) by mouth daily. 90 tablet 1  . tamsulosin (FLOMAX) 0.4 MG CAPS capsule Take 2 capsules (0.8 mg total) by mouth daily. 180 capsule 0   No current facility-administered medications on file prior to visit.    Allergies  Allergen Reactions  . Bee Venom Anaphylaxis    Social History   Socioeconomic History  . Marital status: Single    Spouse name: Not on file  . Number of children: Not on file  . Years of education: Not on file  . Highest education level: Not on file  Occupational History  . Occupation: Disabled  Tobacco Use  . Smoking status: Former Smoker    Packs/day: 1.00    Years: 28.00    Pack years: 28.00    Types: Cigarettes    Quit date: 06/03/2014    Years since quitting: 5.3  . Smokeless tobacco: Never Used  Substance and Sexual Activity  . Alcohol use: Yes    Alcohol/week: 0.0  standard drinks    Comment: 12/23/2013 "might drink a couple times/yr"  . Drug use: Yes    Types: Marijuana    Comment: 02/25/18- patient  states has not used marjuana  . Sexual activity: Yes  Other Topics Concern  . Not on file  Social History Narrative   Pt lives with his brother    Social Determinants of Health   Financial Resource Strain:   . Difficulty of Paying Living Expenses:   Food Insecurity:   . Worried About Charity fundraiser in the Last Year:   . Arboriculturist in the Last Year:   Transportation Needs:   . Film/video editor (Medical):   Marland Kitchen Lack of  Transportation (Non-Medical):   Physical Activity:   . Days of Exercise per Week:   . Minutes of Exercise per Session:   Stress:   . Feeling of Stress :   Social Connections:   . Frequency of Communication with Friends and Family:   . Frequency of Social Gatherings with Friends and Family:   . Attends Religious Services:   . Active Member of Clubs or Organizations:   . Attends Archivist Meetings:   Marland Kitchen Marital Status:   Intimate Partner Violence:   . Fear of Current or Ex-Partner:   . Emotionally Abused:   Marland Kitchen Physically Abused:   . Sexually Abused:     Family History  Problem Relation Age of Onset  . Diabetes Father   . Arthritis Father   . Hypertension Father   . Diabetes Mother   . Arthritis Mother   . Hypertension Mother   . Hypertension Brother     Past Surgical History:  Procedure Laterality Date  . CARDIAC CATHETERIZATION  08/10/13  . CHOLECYSTECTOMY  ~ 2010  . IMPLANTABLE CARDIOVERTER DEFIBRILLATOR IMPLANT  12/23/2013   STJ Fortify ICD implanted by [name] for cardiomyopathy and syncope  . IMPLANTABLE CARDIOVERTER DEFIBRILLATOR IMPLANT N/A 12/23/2013   Procedure: IMPLANTABLE CARDIOVERTER DEFIBRILLATOR IMPLANT;  Surgeon: Coralyn Mark, MD;  Location: [facility] CATH [facility];  Service: Cardiovascular;  Laterality: N/A;  . INGUINAL HERNIA REPAIR Left 03/01/2018   Procedure: OPEN REPAIR OF LEFT  INGUINAL HERNIA WITH MESH;  Surgeon: Greer Pickerel, MD;  Location: [facility];  Service: General;  Laterality: Left;  . LEFT HEART CATHETERIZATION WITH CORONARY ANGIOGRAM N/A 08/10/2013   Procedure: LEFT HEART CATHETERIZATION WITH CORONARY ANGIOGRAM;  Surgeon: Jettie Booze, MD;  Location: [facility] C[facility] LAB;  Service: Cardiovascular;  Laterality: N/A;  . ORIF HUMERUS FRACTURE Left 08/12/2013   Procedure: OPEN REDUCTION INTERNAL FIXATION (ORIF) HUMERAL SHAFT FRACTURE;  Surgeon: Newt Minion, MD;  Location[facility];  Service: Orthopedics;  Laterality: Left;  Open Reduction Internal Fixation Left Humerus  . URETEROSCOPY     "laser for kidney stones"    ROS: Review of Systems Negative except as stated above  PHYSICAL EXAM: BP 103/70   Pulse 83   Temp (!) 97 F (36.1 C)   Resp 16   Wt 236 lb 3.2 oz (107.1 kg)   SpO2 95%   BMI 32.03 kg/m   Wt Readings from Last 3 Encounters:  10/24/19 236 lb 3.2 oz (107.1 kg)  07/29/19 225 lb 9.6 oz (102.3 kg)  03/24/19 225 lb 6.4 oz (102.2 kg)    Physical Exam   General appearance - alert, well appearing, and in no distress Mental status - normal mood, behavior, speech, dress, motor activity, and thought processes Neck - supple, no significant adenopathy Chest - clear to auscultation, no wheezes, rales or rhonchi, symmetric air entry Heart - normal rate, regular rhythm, normal S1, S2, no murmurs, rubs, clicks or gallops Extremities - no lower extremity edema  CMP Latest Ref Rng & Units 10/14/2019 10/13/2018 06/28/2018  Glucose 65 - 99 mg/dL 130(H) 118(H) 168(H)  BUN 6 - 24 mg/dL 11 38(H) 18  Creatinine 0.76 - 1.27 mg/dL 1.51(H) 2.75(H) 1.66(H)  Sodium 134 - 144 mmol/L 148(H) 142 142  Potassium 3.5 - 5.2 mmol/L 3.8 4.4 4.0  Chloride 96 - 106 mmol/L 104 103 102  CO2 20 - 29 mmol/L 25 24 24   Calcium 8.7 - 10.2 mg/dL 9.7 9.5 9.3  Total Protein 6.0 - 8.5 g/dL 7.4 -  7.0  Total Bilirubin 0.0 - 1.2 mg/dL 0.4 - 0.4  Alkaline Phos 39 - 117 IU/L 74 - 50  AST 0 -  40 IU/L 15 - 14  ALT 0 - 44 IU/L 13 - 12   Lipid Panel     Component Value Date/Time   CHOL 115 10/14/2019 0000   TRIG 66 10/14/2019 0000   HDL 44 10/14/2019 0000   CHOLHDL 2.6 10/14/2019 0000   CHOLHDL 4 05/08/2009 1040   VLDL 27.0 05/08/2009 1040   LDLCALC 57 10/14/2019 0000    CBC    Component Value Date/Time   WBC 4.5 06/28/2018 1440   WBC 4.7 02/26/2018 1134   RBC 4.17 06/28/2018 1440   RBC 4.06 (L) 02/26/2018 1134   HGB 12.5 (L) 06/28/2018 1440   HCT 36.9 (L) 06/28/2018 1440   PLT 301 06/28/2018 1440   MCV 89 06/28/2018 1440   MCH 30.0 06/28/2018 1440   MCH 30.0 02/26/2018 1134   MCHC 33.9 06/28/2018 1440   MCHC 33.3 02/26/2018 1134   RDW 13.4 06/28/2018 1440   LYMPHSABS 1.7 06/28/2018 1440   MONOABS 0.6 09/21/2017 2158   EOSABS 0.3 06/28/2018 1440   BASOSABS 0.0 06/28/2018 1440    ASSESSMENT AND PLAN: 1. Type 2 diabetes mellitus with diabetic polyneuropathy, without long-term current use of insulin (HCC) Not at goal.  Patient attributes this to changes in eating habits. He will work on improving his eating habits.  He will continue regular exercise.  We agreed to hold off on increasing or changing his diabetes medicine.  If A1c not at goal on next visit we will need to make changes. - POCT glucose (manual entry) - POCT glycosylated hemoglobin (Hb A1C)  2. Class 1 obesity due to excess calories with serious comorbidity and body mass index (BMI) of 32.0 to 32.9 in adult See #1 above  3. Chronic systolic congestive heart failure (HCC) Clinically stable and compensated.  Continue current medications  4. CKD (chronic kidney disease) stage 2, GFR 60-89 ml/min Kidney function has improved.  Continue to monitor.  Avoid NSAIDs and other medications that can adversely affect the kidney    Patient was given the opportunity to ask questions.  Patient verbalized understanding of the plan and was able to repeat key elements of the plan.   Orders Placed This Encounter    Procedures  . POCT glucose (manual entry)  . POCT glycosylated hemoglobin (Hb A1C)     Requested Prescriptions    No prescriptions requested or ordered in this encounter    Return in about 4 months (around 02/24/2020).  Karle Plumber, MD, FACP

## 2019-10-25 ENCOUNTER — Other Ambulatory Visit: Payer: Self-pay | Admitting: *Deleted

## 2019-10-25 DIAGNOSIS — I428 Other cardiomyopathies: Secondary | ICD-10-CM

## 2019-10-25 DIAGNOSIS — I519 Heart disease, unspecified: Secondary | ICD-10-CM

## 2019-10-25 MED ORDER — ENTRESTO 24-26 MG PO TABS: 1.0000 | ORAL_TABLET | Freq: Two times a day (BID) | ORAL | 10 refills | Status: DC

## 2019-10-25 NOTE — Progress Notes (Signed)
HPI: FU CM/CHF. Echo 2/15 revealed EF of 20-25% with diffuse hypokinesis and biatrial enlargement, mild MR and a small pericardial effusion. TSH in February 2015 normal. Cardiac catheterization in March of 2015 showed normal coronary arteries.Also with prior ICD. CPX August 2017 showed submaximal effort with moderate functional impairment primarily circulatory limited. Echocardiogram repeated September 2020.  Ejection fraction 45 to 50%, moderate left ventricular hypertrophy, grade 1 diastolic dysfunction, mild left atrial enlargement. Since last seen,he had Covid in January.  He has slowly improved.  He still has occasional dyspnea and chest heaviness.  However he can walk 3 miles.  He denies pedal edema or syncope.  Current Outpatient Medications  Medication Sig Dispense Refill  . ACCU-CHEK FASTCLIX LANCETS MISC Use to check blood sugar 3 times daily. 102 each 11  . aspirin 81 MG tablet Take 81 mg by mouth daily.    Marland Kitchen atorvastatin (LIPITOR) 10 MG tablet Take 1 tablet (10 mg total) by mouth daily. 30 tablet 11  . benzonatate (TESSALON) 200 MG capsule Take 1 capsule (200 mg total) by mouth 3 (three) times daily as needed for cough. 40 capsule 0  . carvedilol (COREG) 6.25 MG tablet Take 1 tablet (6.25 mg total) by mouth 2 (two) times daily. 180 tablet 3  . cetirizine (ZYRTEC) 10 MG tablet Take 1 tablet (10 mg total) by mouth daily. 90 tablet 0  . cyclobenzaprine (FLEXERIL) 5 MG tablet Take 1 tablet (5 mg total) by mouth at bedtime. 30 tablet 1  . EPINEPHrine 0.3 mg/0.3 mL IJ SOAJ injection Use as directed for severe allergic reactions. 2 Device 1  . famotidine (PEPCID) 20 MG tablet Take 1 tablet (20 mg total) by mouth daily. 90 tablet 0  . fluticasone (FLONASE) 50 MCG/ACT nasal spray Use 1-2 sprays in each nostril once a day as needed for nasal congestion. 16 g 3  . furosemide (LASIX) 80 MG tablet Take 1 tablet (80 mg total) by mouth 2 (two) times daily. 180 tablet 1  . gabapentin  (NEURONTIN) 300 MG capsule Take 1 capsule (300 mg total) by mouth at bedtime. 90 capsule 3  . glucose blood (ACCU-CHEK GUIDE) test strip Use as instructed to check blood sugar 3 times daily. 100 each 11  . Lancet Devices (ACCU-CHEK SOFTCLIX) lancets 1 each by Other route 3 (three) times daily. 1 each 0  . levalbuterol (XOPENEX HFA) 45 MCG/ACT inhaler Inhale 2 puffs into the lungs every 6 (six) hours as needed for wheezing. 1 Inhaler 1  . mometasone (ELOCON) 0.1 % cream Apply 1 application topically daily. 45 g 1  . sacubitril-valsartan (ENTRESTO) 24-26 MG Take 1 tablet by mouth 2 (two) times daily. 60 tablet 10  . sitaGLIPtin (JANUVIA) 25 MG tablet Take 1 tablet (25 mg total) by mouth daily. 90 tablet 1  . tamsulosin (FLOMAX) 0.4 MG CAPS capsule Take 2 capsules (0.8 mg total) by mouth daily. 180 capsule 0   No current facility-administered medications for this visit.     Past Medical History:  Diagnosis Date  . AICD (automatic cardioverter/defibrillator) present   . Chronic bronchitis ([place])    "get it most q yr" (12/23/2013)  . Chronic systolic (congestive) heart failure ([place])   . Fracture of left humerus    a. 07/2013.  Marland Kitchen GERD (gastroesophageal reflux disease)    not at present time  . Heart murmur    "born w/it"   . History of renal calculi   . Hypertension   . Kidney  stones   . NICM (nonischemic cardiomyopathy) ([place])    a. 07/2013 Echo: EF 20-25%, diff HK, Gr2 DD, mild MR, mod dil LA/RA. EF 40% 2017 echo  . Obesity   . 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 Syncope   . Type II diabetes mellitus ([place]) 2006    Past Surgical History:  Procedure Laterality Date  . CARDIAC CATHETERIZATION  08/10/13  . CHOLECYSTECTOMY  ~ 2010  . IMPLANTABLE CARDIOVERTER DEFIBRILLATOR IMPLANT  12/23/2013   STJ Fortify ICD implanted by [name] for cardiomyopathy and syncope  .  IMPLANTABLE CARDIOVERTER DEFIBRILLATOR IMPLANT N/A 12/23/2013   Procedure: IMPLANTABLE CARDIOVERTER DEFIBRILLATOR IMPLANT;  Surgeon: Coralyn Mark, MD;  Location: [facility] CATH LAB;  Service: Cardiovascular;  Laterality: N/A;  . INGUINAL HERNIA REPAIR Left 03/01/2018   Procedure: OPEN REPAIR OF LEFT INGUINAL HERNIA WITH MESH;  Surgeon: Greer Pickerel, MD;  Location: [facility];  Service: General;  Laterality: Left;  . LEFT HEART CATHETERIZATION WITH CORONARY ANGIOGRAM N/A 08/10/2013   Procedure: LEFT HEART CATHETERIZATION WITH CORONARY ANGIOGRAM;  Surgeon: Jettie Booze, MD;  Location: [facility] C[facility] LAB;  Service: Cardiovascular;  Laterality: N/A;  . ORIF HUMERUS FRACTURE Left 08/12/2013   Procedure: OPEN REDUCTION INTERNAL FIXATION (ORIF) HUMERAL SHAFT FRACTURE;  Surgeon: Newt Minion, MD;  Location: [facility];  Service: Orthopedics;  Laterality: Left;  Open Reduction Internal Fixation Left Humerus  . URETEROSCOPY     "laser for kidney stones"    Social History   Socioeconomic History  . Marital status: Single    Spouse name: Not on file  . Number of children: Not on file  . Years of education: Not on file  . Highest education level: Not on file  Occupational History  . Occupation: Disabled  Tobacco Use  . Smoking status: Former Smoker    Packs/day: 1.00    Years: 28.00    Pack years: 28.00    Types: Cigarettes    Quit date: 06/03/2014    Years since quitting: 5.4  . Smokeless tobacco: Never Used  Substance and Sexual Activity  . Alcohol use: Yes    Alcohol/week: 0.0 standard drinks    Comment: 12/23/2013 "might drink a couple times/yr"  . Drug use: Yes    Types: Marijuana    Comment: 02/25/18- patient  states has not used marjuana  . Sexual activity: Yes  Other Topics Concern  . Not on file  Social History Narrative   Pt lives with his brother    Social Determinants of Health   Financial Resource Strain:   . Difficulty of Paying Living Expenses:   Food Insecurity:   . Worried About  Charity fundraiser in the Last Year:   . Arboriculturist in the Last Year:   Transportation Needs:   . Film/video editor (Medical):   Marland Kitchen Lack of Transportation (Non-Medical):   Physical Activity:   . Days of Exercise per Week:   . Minutes of Exercise per Session:   Stress:   . Feeling of Stress :   Social Connections:   . Frequency of Communication with Friends and Family:   . Frequency of Social Gatherings with Friends and Family:   . Attends Religious Services:   . Active Member of Clubs or Organizations:   . Attends Archivist Meetings:   Marland Kitchen Marital Status:   Intimate Partner Violence:   .  Fear of Current or Ex-Partner:   . Emotionally Abused:   Marland Kitchen Physically Abused:   . Sexually Abused:     Family History  Problem Relation Age of Onset  . Diabetes Father   . Arthritis Father   . Hypertension Father   . Diabetes Mother   . Arthritis Mother   . Hypertension Mother   . Hypertension Brother     ROS: no fevers or chills, productive cough, hemoptysis, dysphasia, odynophagia, melena, hematochezia, dysuria, hematuria, rash, seizure activity, orthopnea, PND, pedal edema, claudication. Remaining systems are negative.  Physical Exam: Well-developed well-nourished in no acute distress.  Skin is warm and dry.  HEENT is normal.  Neck is supple.  Chest is clear to auscultation with normal expansion.  Cardiovascular exam is regular rate and rhythm.  Abdominal exam nontender or distended. No masses palpated. Extremities show no edema. neuro grossly intact  ECG-normal sinus rhythm at a rate of 67, no ST changes.  Personally reviewed  A/P  1 chronic systolic congestive heart failure-patient has slowly recovered from Covid infection and appears to be doing well.  Continue Lasix, fluid restriction and low-sodium diet.  2 nonischemic cardiomyopathy-continue Entresto and carvedilol.  3 chronic chest pain-previous catheterization showed no coronary disease and  symptoms remain atypical.  No further ischemia evaluation at this time.  4 hyperlipidemia-continue statin.  5 prior ICD-managed by electrophysiology.  6 chronic stage III kidney disease-followed by nephrology.  [name], MD

## 2019-10-31 ENCOUNTER — Encounter: Payer: Self-pay | Admitting: Cardiology

## 2019-10-31 ENCOUNTER — Ambulatory Visit (INDEPENDENT_AMBULATORY_CARE_PROVIDER_SITE_OTHER): Payer: Medicaid Other | Admitting: Cardiology

## 2019-10-31 ENCOUNTER — Other Ambulatory Visit: Payer: Self-pay

## 2019-10-31 VITALS — BP 106/78 | HR 67 | Ht 72.0 in | Wt 235.2 lb

## 2019-10-31 DIAGNOSIS — E7800 Pure hypercholesterolemia, unspecified: Secondary | ICD-10-CM | POA: Diagnosis not present

## 2019-10-31 DIAGNOSIS — I10 Essential (primary) hypertension: Secondary | ICD-10-CM | POA: Diagnosis not present

## 2019-10-31 DIAGNOSIS — I428 Other cardiomyopathies: Secondary | ICD-10-CM

## 2019-10-31 DIAGNOSIS — I5022 Chronic systolic (congestive) heart failure: Secondary | ICD-10-CM | POA: Diagnosis not present

## 2019-10-31 NOTE — Patient Instructions (Signed)
Medication Instructions:  NO CHANGE *If you need a refill on your cardiac medications before your next appointment, please call your pharmacy*   Lab Work: If you have labs (blood work) drawn today and your tests are completely normal, you will receive your results only by: . MyChart Message (if you have MyChart) OR . A paper copy in the mail If you have any lab test that is abnormal or we need to change your treatment, we will call you to review the results.   Follow-Up: At [facility], you and your health needs are our priority.  As part of our continuing mission to provide you with exceptional heart care, we have created designated Provider Care Teams.  These Care Teams include your primary Cardiologist (physician) and Advanced Practice Providers (APPs -  Physician Assistants and Nurse Practitioners) who all work together to provide you with the care you need, when you need it.  We recommend signing up for the patient portal called "MyChart".  Sign up information is provided on this After Visit Summary.  MyChart is used to connect with patients for Virtual Visits (Telemedicine).  Patients are able to view lab/test results, encounter notes, upcoming appointments, etc.  Non-urgent messages can be sent to your provider as well.   To learn more about what you can do with MyChart, go to [email or web address].    Your next appointment:   6 month(s)  The format for your next appointment:   Either In Person or Virtual  Provider:   You may see Brian Crenshaw, MD or one of the following Advanced Practice Providers on your designated Care Team:    Luke Kilroy, PA-C  Callie Goodrich, PA-C  Jesse Cleaver, FNP     

## 2019-11-01 NOTE — Progress Notes (Signed)
Follow Up Note  RE: [name] MRN: [id number] DOB: 05-23-1966 Date of Office Visit: 11/02/2019  Referring provider: [name], MD Primary care provider: [name], MD  Chief Complaint: Follow-up and Urticaria  History of Present Illness: I had the pleasure of seeing [name] for a follow up visit at the [facility] [place] [facility] [place] on 11/02/2019. He is a 54 y.o. male, who is being followed for urticaria, allergic rhinoconjunctivitis, hymenoptera allergy. His previous allergy office visit was on 04/20/2019 with Dr. Maudie Mercury via telemedicine. Today is a regular follow up visit. Up to date with COVID-19 vaccine: received his first injection  Urticaria Patient was taking zyrtec 10mg  at night and famotidine 20mg  in the morning and had no symptoms. He ran out of medications about 3-4 days ago and noticed itching and hives 2 days ago. Mainly occurs at night.  Following with nephrology and Cr is doing better.   Seasonal and perennial allergic rhinoconjunctivitis Controlled as long as he takes zyrtec. Not needed to use nasal sprays.  Anaphylaxis due to hymenoptera venom No stings since the last visit.   Assessment and Plan: [name] is a 54 y.o. male with: Urticaria Past history - Rash/hives for 3-4 years but worse the last 7 months now having flat rash lasting up to 1 day. Describes them as pruritic and occurring on a daily basis. No triggers identified. Tried benadryl, zyrtec and Claritin with no benefit. Used oral steroids with minimal benefit. Interim history - no hives with zyrtec 10mg  QHS and Pepcid 20mg  QAM but ran out of medications 4 days ago and now having itching/hives at night for the past 2 days. Follow with nephrology and Cr level improving.   Restart Zyrtec 10mg  once a day at night.   Restart Pepcid 20mg  once a day in the morning.   Take pictures when it flares.  Avoid the following potential triggers: alcohol, tight clothing, NSAIDs.  Seasonal  and perennial allergic rhinoconjunctivitis Past history - Bloodwork was positive to dust mites, cat, dog, grass. Borderline positive to mold, cockroach and tree pollen. Interim history - stable with daily zyrtec. Noticed some nasal congestion since off zyrtec.   Continue zyrtec 10mg  daily.  May use Flonase 1-2 sprays in each nostril once a day as needed for nasal symptoms.   Continue environmental control measures.  Anaphylaxis due to hymenoptera venom Past history - Anaphylactic reaction to hymenoptera in the past and apparently was on allergy immunotherapy as a child for this. Most recently got stung a few years ago which required epinephrine use. Bloodwork was borderline positive to paper wasp. Interim history - No stings since last visit.   Avoid insect stings.  For mild symptoms you can take over the counter antihistamines such as Benadryl and monitor symptoms closely. If symptoms worsen or if you have severe symptoms including breathing issues, throat closure, significant swelling, whole body hives, severe diarrhea and vomiting, lightheadedness then inject epinephrine and seek immediate medical care afterwards.  Return in about 6 months (around 05/04/2020).  Meds ordered this encounter  Medications  . cetirizine (ZYRTEC) 10 MG tablet    Sig: Take 1 tablet (10 mg total) by mouth daily.    Dispense:  90 tablet    Refill:  1  . famotidine (PEPCID) 20 MG tablet    Sig: Take 1 tablet (20 mg total) by mouth daily.    Dispense:  90 tablet    Refill:  1   Diagnostics: None.   Medication  List:  Current Outpatient Medications  Medication Sig Dispense Refill  . ACCU-CHEK FASTCLIX LANCETS MISC Use to check blood sugar 3 times daily. 102 each 11  . aspirin 81 MG tablet Take 81 mg by mouth daily.    Marland Kitchen atorvastatin (LIPITOR) 10 MG tablet Take 1 tablet (10 mg total) by mouth daily. 30 tablet 11  . benzonatate (TESSALON) 200 MG capsule Take 1 capsule (200 mg total) by mouth 3 (three)  times daily as needed for cough. 40 capsule 0  . carvedilol (COREG) 6.25 MG tablet Take 1 tablet (6.25 mg total) by mouth 2 (two) times daily. 180 tablet 3  . cetirizine (ZYRTEC) 10 MG tablet Take 1 tablet (10 mg total) by mouth daily. 90 tablet 1  . cyclobenzaprine (FLEXERIL) 5 MG tablet Take 1 tablet (5 mg total) by mouth at bedtime. 30 tablet 1  . EPINEPHrine 0.3 mg/0.3 mL IJ SOAJ injection Use as directed for severe allergic reactions. 2 Device 1  . famotidine (PEPCID) 20 MG tablet Take 1 tablet (20 mg total) by mouth daily. 90 tablet 1  . fluticasone (FLONASE) 50 MCG/ACT nasal spray Use 1-2 sprays in each nostril once a day as needed for nasal congestion. 16 g 3  . furosemide (LASIX) 80 MG tablet Take 1 tablet (80 mg total) by mouth 2 (two) times daily. 180 tablet 1  . gabapentin (NEURONTIN) 300 MG capsule Take 1 capsule (300 mg total) by mouth at bedtime. 90 capsule 3  . glucose blood (ACCU-CHEK GUIDE) test strip Use as instructed to check blood sugar 3 times daily. 100 each 11  . Lancet Devices (ACCU-CHEK SOFTCLIX) lancets 1 each by Other route 3 (three) times daily. 1 each 0  . levalbuterol (XOPENEX HFA) 45 MCG/ACT inhaler Inhale 2 puffs into the lungs every 6 (six) hours as needed for wheezing. 1 Inhaler 1  . mometasone (ELOCON) 0.1 % cream Apply 1 application topically daily. 45 g 1  . sacubitril-valsartan (ENTRESTO) 24-26 MG Take 1 tablet by mouth 2 (two) times daily. 60 tablet 10  . sitaGLIPtin (JANUVIA) 25 MG tablet Take 1 tablet (25 mg total) by mouth daily. 90 tablet 1  . tamsulosin (FLOMAX) 0.4 MG CAPS capsule Take 2 capsules (0.8 mg total) by mouth daily. 180 capsule 0   No current facility-administered medications for this visit.   Allergies: Allergies  Allergen Reactions  . Bee Venom Anaphylaxis   I reviewed his past medical history, social history, family history, and environmental history and no significant changes have been reported from his previous visit.  Review of  Systems  Constitutional: Negative for appetite change, chills, fever and unexpected weight change.  HENT: Positive for congestion. Negative for rhinorrhea.   Eyes: Negative for itching.  Respiratory: Negative for cough, chest tightness, shortness of breath and wheezing.   Cardiovascular: Negative for chest pain.  Gastrointestinal: Negative for abdominal pain.  Genitourinary: Negative for difficulty urinating.  Skin: Positive for rash.  Allergic/Immunologic: Positive for environmental allergies. Negative for food allergies.  Neurological: Negative for headaches.   Objective: BP 104/70 (BP Location: Right Arm, Patient Position: Sitting, Cuff Size: Large)   Pulse 69   Temp 97.6 F (36.4 C) (Temporal)   Resp 16   Wt 237 lb 9.6 oz (107.8 kg)   SpO2 98%   BMI 32.22 kg/m  Body mass index is 32.22 kg/m. Physical Exam  Constitutional: He is oriented to person, place, and time. He appears well-developed and well-nourished.  HENT:  Head: Normocephalic and atraumatic.  Right Ear: External ear normal.  Left Ear: External ear normal.  Nose: Nose normal.  Mouth/Throat: Oropharynx is clear and moist.  Eyes: Conjunctivae and EOM are normal.  Cardiovascular: Normal rate, regular rhythm and normal heart sounds. Exam reveals no gallop and no friction rub.  No murmur heard. Pulmonary/Chest: Effort normal and breath sounds normal. He has no wheezes. He has no rales.  Abdominal: Soft.  Musculoskeletal:     Cervical back: Neck supple.  Neurological: He is alert and oriented to person, place, and time.  Skin: Skin is warm. No rash noted.  Psychiatric: He has a normal mood and affect. His behavior is normal.  Nursing note and vitals reviewed.  Previous notes and tests were reviewed. The plan was reviewed with the patient/family, and all questions/concerned were addressed.  It was my pleasure to see [name] today and participate in his care. Please feel free to contact me with any questions or  concerns.  Sincerely,  Rexene Alberts, DO Allergy & Immunology  [facility] office: [phone] [facility] office: [place] office: [phone]

## 2019-11-02 ENCOUNTER — Ambulatory Visit (INDEPENDENT_AMBULATORY_CARE_PROVIDER_SITE_OTHER): Payer: Medicaid Other | Admitting: Allergy

## 2019-11-02 ENCOUNTER — Other Ambulatory Visit: Payer: Self-pay

## 2019-11-02 ENCOUNTER — Encounter: Payer: Self-pay | Admitting: Allergy

## 2019-11-02 VITALS — BP 104/70 | HR 69 | Temp 97.6°F | Resp 16 | Wt 237.6 lb

## 2019-11-02 DIAGNOSIS — H1010 Acute atopic conjunctivitis, unspecified eye: Secondary | ICD-10-CM

## 2019-11-02 DIAGNOSIS — L509 Urticaria, unspecified: Secondary | ICD-10-CM

## 2019-11-02 DIAGNOSIS — J302 Other seasonal allergic rhinitis: Secondary | ICD-10-CM | POA: Diagnosis not present

## 2019-11-02 DIAGNOSIS — T63481D Toxic effect of venom of other arthropod, accidental (unintentional), subsequent encounter: Secondary | ICD-10-CM | POA: Diagnosis not present

## 2019-11-02 DIAGNOSIS — T782XXD Anaphylactic shock, unspecified, subsequent encounter: Secondary | ICD-10-CM

## 2019-11-02 DIAGNOSIS — J3089 Other allergic rhinitis: Secondary | ICD-10-CM

## 2019-11-02 MED ORDER — CETIRIZINE HCL 10 MG PO TABS: 10.0000 mg | ORAL_TABLET | Freq: Every day | ORAL | 1 refills | Status: DC

## 2019-11-02 MED ORDER — FAMOTIDINE 20 MG PO TABS: 20.0000 mg | ORAL_TABLET | Freq: Every day | ORAL | 1 refills | Status: DC

## 2019-11-02 NOTE — Assessment & Plan Note (Signed)
Past history - Rash/hives for 3-4 years but worse the last 7 months now having flat rash lasting up to 1 day. Describes them as pruritic and occurring on a daily basis. No triggers identified. Tried benadryl, zyrtec and Claritin with no benefit. Used oral steroids with minimal benefit. Interim history - no hives with zyrtec 10mg  QHS and Pepcid 20mg  QAM but ran out of medications 4 days ago and now having itching/hives at night for the past 2 days. Follow with nephrology and Cr level improving.   Restart Zyrtec 10mg  once a day at night.   Restart Pepcid 20mg  once a day in the morning.   Take pictures when it flares.  Avoid the following potential triggers: alcohol, tight clothing, NSAIDs.

## 2019-11-02 NOTE — Assessment & Plan Note (Signed)
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 Most recently got stung a few years ago which required epinephrine use. Bloodwork was borderline positive to paper wasp. Interim history - No stings since last visit.   Avoid insect stings.  For mild symptoms you can take over the counter antihistamines such as Benadryl and monitor symptoms closely. If symptoms worsen or if you have severe symptoms including breathing issues, throat closure, significant swelling, whole body hives, severe diarrhea and vomiting, lightheadedness then inject epinephrine and seek immediate medical care afterwards.

## 2019-11-02 NOTE — Assessment & Plan Note (Signed)
Past history - Bloodwork was positive to dust mites, cat, dog, grass. Borderline positive to mold, cockroach and tree pollen. Interim history - stable with daily zyrtec. Noticed some nasal congestion since off zyrtec.   Continue zyrtec 10mg  daily.  May use Flonase 1-2 sprays in each nostril once a day as needed for nasal symptoms.   Continue environmental control measures.

## 2019-11-02 NOTE — Patient Instructions (Addendum)
Urticaria  Restart Zyrtec 10mg  once a day at night.   Restart Pepcid 20mg  once a day in the morning.   Take pictures when it flares.  Avoid the following potential triggers: alcohol, tight clothing, NSAIDs.  Seasonal and perennial allergic rhinoconjunctivitis Past history - Bloodwork was positive to dust mites, cat, dog, grass. Borderline positive to mold, cockroach and tree pollen.  Continue zyrtec 10mg  daily.  May use Flonase 1-2 sprays in each nostril once a day as needed for nasal symptoms.   Continue environmental control measures.  Anaphylaxis due to hymenoptera venom Past history - Bloodwork was borderline positive to paper wasp.  Avoid insect stings.  For mild symptoms you can take over the counter antihistamines such as Benadryl and monitor symptoms closely. If symptoms worsen or if you have severe symptoms including breathing issues, throat closure, significant swelling, whole body hives, severe diarrhea and vomiting, lightheadedness then inject epinephrine and seek immediate medical care afterwards.  Follow up in 6 months or sooner if needed.   Reducing Pollen Exposure . Pollen seasons: trees (spring), grass (summer) and ragweed/weeds (fall). Marland Kitchen Keep windows closed in your home and car to lower pollen exposure.  Susa Simmonds air conditioning in the bedroom and throughout the house if possible.  . Avoid going out in dry windy days - especially early morning. . Pollen counts are highest between 5 - 10 AM and on dry, hot and windy days.  . Save outside activities for late afternoon or after a heavy rain, when pollen levels are lower.  . Avoid mowing of grass if you have grass pollen allergy. Marland Kitchen Be aware that pollen can also be transported indoors on people and pets.  . Dry your clothes in an automatic dryer rather than hanging them outside where they might collect pollen.  . Rinse hair and eyes before bedtime. Control of House Dust Mite Allergen . Dust mite allergens  are a common trigger of allergy and asthma symptoms. While they can be found throughout the house, these microscopic creatures thrive in warm, humid environments such as bedding, upholstered furniture and carpeting. . Because so much time is spent in the bedroom, it is essential to reduce mite levels there.  . Encase pillows, mattresses, and box springs in special allergen-proof fabric covers or airtight, zippered plastic covers.  . Bedding should be washed weekly in hot water (130 F) and dried in a hot dryer. Allergen-proof covers are available for comforters and pillows that can't be regularly washed.  Wendee Copp the allergy-proof covers every few months. Minimize clutter in the bedroom. Keep pets out of the bedroom.  Marland Kitchen Keep humidity less than 50% by using a dehumidifier or air conditioning. You can buy a humidity measuring device called a hygrometer to monitor this.  . If possible, replace carpets with hardwood, linoleum, or washable area rugs. If that's not possible, vacuum frequently with a vacuum that has a HEPA filter. . Remove all upholstered furniture and non-washable window drapes from the bedroom. . Remove all non-washable stuffed toys from the bedroom.  Wash stuffed toys weekly. Pet Allergen Avoidance: . Contrary to popular opinion, there are no "hypoallergenic" breeds of dogs or cats. That is because people are not allergic to an animal's hair, but to an allergen found in the animal's saliva, dander (dead skin flakes) or urine. Pet allergy symptoms typically occur within minutes. For some people, symptoms can build up and become most severe 8 to 12 hours after contact with the animal. People with severe allergies  can experience reactions in public places if dander has been transported on the pet owners' clothing. Marland Kitchen Keeping an animal outdoors is only a partial solution, since homes with pets in the yard still have higher concentrations of animal allergens. . Before getting a pet, ask your  allergist to determine if you are allergic to animals. If your pet is already considered part of your family, try to minimize contact and keep the pet out of the bedroom and other rooms where you spend a great deal of time. . As with dust mites, vacuum carpets often or replace carpet with a hardwood floor, tile or linoleum. . High-efficiency particulate air (HEPA) cleaners can reduce allergen levels over time. . While dander and saliva are the source of cat and dog allergens, urine is the source of allergens from rabbits, hamsters, mice and [place] pigs; so ask a non-allergic family member to clean the animal's cage. . If you have a pet allergy, talk to your allergist about the potential for allergy immunotherapy (allergy shots). This strategy can often provide long-term relief.

## 2019-11-16 ENCOUNTER — Telehealth: Payer: Self-pay

## 2019-11-16 NOTE — Telephone Encounter (Signed)
Spoke with patient to remind of missed remote transmission 

## 2019-11-18 ENCOUNTER — Other Ambulatory Visit: Payer: Self-pay | Admitting: Pharmacist

## 2019-11-18 ENCOUNTER — Encounter: Payer: Self-pay | Admitting: Internal Medicine

## 2019-11-18 ENCOUNTER — Other Ambulatory Visit: Payer: Self-pay | Admitting: Internal Medicine

## 2019-11-18 DIAGNOSIS — E1169 Type 2 diabetes mellitus with other specified complication: Secondary | ICD-10-CM

## 2019-11-18 MED ORDER — ATORVASTATIN CALCIUM 10 MG PO TABS: 10.0000 mg | ORAL_TABLET | Freq: Every day | ORAL | 0 refills | Status: DC

## 2019-11-18 NOTE — Progress Notes (Signed)
No ICM remote transmission received for 11/15/2019 and next ICM transmission scheduled for 12/05/2019.

## 2019-12-02 ENCOUNTER — Ambulatory Visit: Payer: Medicaid Other | Admitting: Podiatry

## 2019-12-09 NOTE — Progress Notes (Signed)
No ICM remote transmission received for 12/05/2019 and next ICM transmission scheduled for 01/02/2020.

## 2019-12-23 ENCOUNTER — Other Ambulatory Visit: Payer: Self-pay | Admitting: Internal Medicine

## 2019-12-23 NOTE — Telephone Encounter (Signed)
Requested medication (s) are due for refill today: yes  Requested medication (s) are on the active medication list: yes  Last refill:  11/26/2019  Future visit scheduled: no  Notes to clinic:   This refill cannot be delegated  Requested Prescriptions  Pending Prescriptions Disp Refills   cyclobenzaprine (FLEXERIL) 5 MG tablet [Pharmacy Med Name: Cyclobenzaprine HCl 5 MG Oral Tablet] 30 tablet 0    Sig: TAKE 1 TABLET BY MOUTH AT BEDTIME      Not Delegated - Analgesics:  Muscle Relaxants Failed - 12/23/2019  1:04 PM      Failed - This refill cannot be delegated      Passed - Valid encounter within last 6 months    Recent Outpatient Visits           2 months ago Type 2 diabetes mellitus with diabetic polyneuropathy, without long-term current use of insulin ([place])   [place] [name], MD   5 months ago COVID-19 virus infection   [place], Deborah B, MD   5 months ago COVID-19 virus infection   [place], Patrick E, MD   5 months ago Encounter by telehealth for suspected [place] Elsie Stain, MD   9 months ago Type 2 diabetes mellitus with diabetic polyneuropathy, without long-term current use of insulin [facility])   [place], Deborah B, MD       Future Appointments             In 4 months Crenshaw, Denice Bors, MD [facility] [place], [facility]   In 4 months Garnet Sierras, DO [facility] and [place]

## 2019-12-24 ENCOUNTER — Encounter: Payer: Self-pay | Admitting: Internal Medicine

## 2019-12-25 MED ORDER — CYCLOBENZAPRINE HCL 5 MG PO TABS: 5.0000 mg | ORAL_TABLET | Freq: Every day | ORAL | 1 refills | Status: DC | PRN

## 2020-01-06 ENCOUNTER — Telehealth: Payer: Self-pay

## 2020-01-06 NOTE — Telephone Encounter (Signed)
The pt agreed to send missed transmission today. °

## 2020-01-10 NOTE — Progress Notes (Signed)
No ICM remote transmission received for 01/02/2020 and next ICM transmission scheduled for 02/06/2020.

## 2020-01-16 ENCOUNTER — Encounter: Payer: Self-pay | Admitting: Internal Medicine

## 2020-01-17 ENCOUNTER — Other Ambulatory Visit: Payer: Self-pay | Admitting: Internal Medicine

## 2020-01-17 DIAGNOSIS — E785 Hyperlipidemia, unspecified: Secondary | ICD-10-CM

## 2020-01-17 DIAGNOSIS — N138 Other obstructive and reflux uropathy: Secondary | ICD-10-CM

## 2020-01-17 NOTE — Telephone Encounter (Signed)
Requested Prescriptions  Pending Prescriptions Disp Refills   tamsulosin (FLOMAX) 0.4 MG CAPS capsule [Pharmacy Med Name: Tamsulosin HCl 0.4 MG Oral Capsule] 180 capsule 0    Sig: Take 2 capsules by mouth once daily     Urology: Alpha-Adrenergic Blocker Passed - 01/17/2020  5:31 AM      Passed - Last BP in normal range    BP Readings from Last 1 Encounters:  11/02/19 104/70         Passed - Valid encounter within last 12 months    Recent Outpatient Visits          2 months ago Type 2 diabetes mellitus with diabetic polyneuropathy, without long-term current use of insulin ([place])   [place], Neoma Laming B, MD   5 months ago COVID-19 virus infection   [place], Dalbert Batman, MD   6 months ago COVID-19 virus infection   [place], MD   6 months ago Encounter by telehealth for suspected [place] Elsie Stain, MD   9 months ago Type 2 diabetes mellitus with diabetic polyneuropathy, without long-term current use of insulin ([place])   [place], Deborah B, MD      Future Appointments            In 3 months Crenshaw, Denice Bors, MD [facility] [place], [facility]   In 3 months Garnet Sierras, DO Allergy and [place]            atorvastatin (LIPITOR) 10 MG tablet [Pharmacy Med Name: Atorvastatin Calcium 10 MG Oral Tablet] 60 tablet 0    Sig: Take 1 tablet by mouth once daily     Cardiovascular:  Antilipid - Statins Failed - 01/17/2020  5:31 AM      Failed - LDL in normal range and within 360 days    LDL Chol Calc (NIH)  Date Value Ref Range Status  10/14/2019 57 0 - 99 mg/dL Final         Passed - Total Cholesterol in normal range and within 360 days    Cholesterol, Total  Date Value Ref Range Status  10/14/2019 115 100 - 199 mg/dL Final         Passed -  HDL in normal range and within 360 days    HDL  Date Value Ref Range Status  10/14/2019 44 >39 mg/dL Final         Passed - Triglycerides in normal range and within 360 days    Triglycerides  Date Value Ref Range Status  10/14/2019 66 0 - 149 mg/dL Final         Passed - Patient is not pregnant      Passed - Valid encounter within last 12 months    Recent Outpatient Visits          2 months ago Type 2 diabetes mellitus with diabetic polyneuropathy, without long-term current use of insulin ([place])   [place], Deborah B, MD   5 months ago COVID-19 virus infection   [place] [name], MD   6 months ago COVID-19 virus infection   [place], Patrick E, MD   6 months ago Encounter by telehealth for suspected [place] [facility] Elsie Stain, MD   9 months ago Type 2 diabetes mellitus with diabetic polyneuropathy, without long-term current use of insulin [facility])   [place], MD      Future Appointments            In 3 months Crenshaw, Denice Bors, MD [place], [place]   In 3 months Garnet Sierras, DO Allergy and [place]

## 2020-02-09 ENCOUNTER — Telehealth: Payer: Self-pay

## 2020-02-09 NOTE — Telephone Encounter (Signed)
LMOVM for pt to send a transmission with his home monitor for missed ICM appointment.

## 2020-02-10 ENCOUNTER — Ambulatory Visit (INDEPENDENT_AMBULATORY_CARE_PROVIDER_SITE_OTHER): Payer: Medicaid Other | Admitting: *Deleted

## 2020-02-10 DIAGNOSIS — I428 Other cardiomyopathies: Secondary | ICD-10-CM | POA: Diagnosis not present

## 2020-02-10 NOTE — Progress Notes (Signed)
No ICM remote transmission received for 02/06/2020 and next ICM transmission scheduled for 02/20/2020.

## 2020-02-12 LAB — CUP PACEART REMOTE DEVICE CHECK
Battery Remaining Longevity: 53 mo
Battery Remaining Percentage: 50 %
Battery Voltage: 2.93 V
Brady Statistic RV Percent Paced: 1 %
Date Time Interrogation Session: 20210904050443
HighPow Impedance: 71 Ohm
HighPow Impedance: 71 Ohm
Implantable Lead Implant Date: 20150717
Implantable Lead Location: 753860
Implantable Pulse Generator Implant Date: 20150717
Lead Channel Impedance Value: 310 Ohm
Lead Channel Pacing Threshold Amplitude: 0.75 V
Lead Channel Pacing Threshold Pulse Width: 0.5 ms
Lead Channel Sensing Intrinsic Amplitude: 11.8 mV
Lead Channel Setting Pacing Amplitude: 2.5 V
Lead Channel Setting Pacing Pulse Width: 0.5 ms
Lead Channel Setting Sensing Sensitivity: 0.5 mV
Pulse Gen Serial Number: 7196009

## 2020-02-13 ENCOUNTER — Other Ambulatory Visit: Payer: Self-pay | Admitting: Internal Medicine

## 2020-02-13 ENCOUNTER — Encounter: Payer: Self-pay | Admitting: Internal Medicine

## 2020-02-13 DIAGNOSIS — Z794 Long term (current) use of insulin: Secondary | ICD-10-CM

## 2020-02-14 ENCOUNTER — Other Ambulatory Visit: Payer: Self-pay | Admitting: Internal Medicine

## 2020-02-14 ENCOUNTER — Other Ambulatory Visit: Payer: Self-pay

## 2020-02-14 DIAGNOSIS — E118 Type 2 diabetes mellitus with unspecified complications: Secondary | ICD-10-CM

## 2020-02-14 DIAGNOSIS — Z794 Long term (current) use of insulin: Secondary | ICD-10-CM

## 2020-02-14 MED ORDER — SITAGLIPTIN PHOSPHATE 25 MG PO TABS: 25.0000 mg | ORAL_TABLET | Freq: Every day | ORAL | 0 refills | Status: DC

## 2020-02-14 NOTE — Telephone Encounter (Signed)
Pt has an appt on Apr 09 2020. Pt needs a refill on januvia. [facility] [address]. Pt last seen dr Wynetta Emery may 2021.

## 2020-02-15 NOTE — Progress Notes (Signed)
Remote ICD transmission.   

## 2020-02-16 ENCOUNTER — Telehealth: Payer: Self-pay

## 2020-02-16 ENCOUNTER — Encounter: Payer: Self-pay | Admitting: Internal Medicine

## 2020-02-16 DIAGNOSIS — Z794 Long term (current) use of insulin: Secondary | ICD-10-CM

## 2020-02-16 MED ORDER — SITAGLIPTIN PHOSPHATE 25 MG PO TABS: 25.0000 mg | ORAL_TABLET | Freq: Every day | ORAL | 0 refills | Status: DC

## 2020-02-16 NOTE — Telephone Encounter (Signed)
JANUVIA PA APPROVED THRU 02/15/21

## 2020-02-17 NOTE — Telephone Encounter (Signed)
Disregard

## 2020-02-27 ENCOUNTER — Other Ambulatory Visit: Payer: Self-pay | Admitting: Cardiology

## 2020-02-27 DIAGNOSIS — I10 Essential (primary) hypertension: Secondary | ICD-10-CM

## 2020-02-27 MED ORDER — CARVEDILOL 6.25 MG PO TABS: 6.2500 mg | ORAL_TABLET | Freq: Two times a day (BID) | ORAL | 1 refills | Status: DC

## 2020-02-28 ENCOUNTER — Ambulatory Visit (INDEPENDENT_AMBULATORY_CARE_PROVIDER_SITE_OTHER): Payer: Medicaid Other

## 2020-02-28 DIAGNOSIS — Z95810 Presence of automatic (implantable) cardiac defibrillator: Secondary | ICD-10-CM | POA: Diagnosis not present

## 2020-02-28 DIAGNOSIS — I5022 Chronic systolic (congestive) heart failure: Secondary | ICD-10-CM | POA: Diagnosis not present

## 2020-02-28 NOTE — Progress Notes (Signed)
EPIC Encounter for ICM Monitoring  Patient Name: [name] is a 54 y.o. male Date: 02/28/2020 Primary Care Physican: [name], MD Primary Cardiologist:Crenshaw Electrophysiologist: Allred 9/21/2021Weight228lbs  Spoke with patient and he denies any fluid symptoms.  His food intake consist of restaurant foods 1-2 times daily along with large amounts of daily fluid intake.  Discussed limiting salt and fluid intake and to try to avoid restaurant foods if possible.  He will send updated [facility] remote transmission on 9/22 since the last one received was 9/19.    [facility] thoracic impedancenormal.  Prescribed dosage:Furosemide 80 mgtake 1 tablet (80 mg total)twice a day.   Labs: 06/28/2018 Creatinine 1.66, BUN 18, Potassium 4.0, Sodium 142, GFR 46-54 Complete set of lab results in result review.  Recommendations:  He reports Dr Stanford Breed has approved of him taking extra Furosemide when needed and will do so after 9/22 remote transmission reviewed if needed.  Follow-up plan: ICM clinic phone appointment on 03/05/2020 to recheck fluid levels.   91 day device clinic remote transmission 05/11/2020.    EP/Cardiology Office Visits: 05/07/2020 with [name].    Copy of ICM check sent to Dr. Rayann Heman and Dr Stanford Breed.     3 month ICM trend: 02/26/2020    1 Year ICM trend:       [name], RN 02/28/2020 12:54 PM

## 2020-02-29 NOTE — Progress Notes (Signed)
Call to patient and advised fluid levels returned to normal on 9/21/201 and no extra diuretic needed.  Advised next ICM remote transmission will be 04/02/2020.

## 2020-03-23 ENCOUNTER — Other Ambulatory Visit: Payer: Self-pay | Admitting: Cardiology

## 2020-03-23 DIAGNOSIS — I5042 Chronic combined systolic (congestive) and diastolic (congestive) heart failure: Secondary | ICD-10-CM

## 2020-03-27 ENCOUNTER — Other Ambulatory Visit: Payer: Self-pay | Admitting: Cardiology

## 2020-03-27 DIAGNOSIS — I5042 Chronic combined systolic (congestive) and diastolic (congestive) heart failure: Secondary | ICD-10-CM

## 2020-03-27 MED ORDER — FUROSEMIDE 80 MG PO TABS: 80.0000 mg | ORAL_TABLET | Freq: Two times a day (BID) | ORAL | 3 refills | Status: DC

## 2020-04-02 ENCOUNTER — Ambulatory Visit (INDEPENDENT_AMBULATORY_CARE_PROVIDER_SITE_OTHER): Payer: Medicaid Other

## 2020-04-02 DIAGNOSIS — Z95810 Presence of automatic (implantable) cardiac defibrillator: Secondary | ICD-10-CM

## 2020-04-02 DIAGNOSIS — I5022 Chronic systolic (congestive) heart failure: Secondary | ICD-10-CM

## 2020-04-02 DIAGNOSIS — I5042 Chronic combined systolic (congestive) and diastolic (congestive) heart failure: Secondary | ICD-10-CM | POA: Diagnosis not present

## 2020-04-04 NOTE — Progress Notes (Signed)
EPIC Encounter for ICM Monitoring  Patient Name: [name] is a 54 y.o. male Date: 04/04/2020 Primary Care Physican: [name], MD Primary Cardiologist:Crenshaw Electrophysiologist: Allred 9/21/2021Weight228lbs  Transmission reviewed and results sent via mychart.  Corvue thoracic impedancenormal.  Prescribed dosage:Furosemide 80 mgtake 1 tablet (80 mg total)twice a day.   Labs: 06/28/2018 Creatinine 1.66, BUN 18, Potassium 4.0, Sodium 142, GFR 46-54 Complete set of lab results in result review.  Recommendations:  No changes  Follow-up plan: ICM clinic phone appointment on 05/07/2020.   91 day device clinic remote transmission 05/11/2020.    EP/Cardiology Office Visits: 05/07/2020 with [name].    Copy of ICM check sent to Dr. Rayann Heman    3 month ICM trend: 04/04/2020    1 Year ICM trend:       [name], RN 04/04/2020 4:18 PM

## 2020-04-05 DIAGNOSIS — R7309 Other abnormal glucose: Secondary | ICD-10-CM | POA: Diagnosis not present

## 2020-04-05 DIAGNOSIS — N183 Chronic kidney disease, stage 3 unspecified: Secondary | ICD-10-CM | POA: Diagnosis not present

## 2020-04-09 ENCOUNTER — Other Ambulatory Visit: Payer: Self-pay

## 2020-04-09 ENCOUNTER — Encounter: Payer: Self-pay | Admitting: Internal Medicine

## 2020-04-09 ENCOUNTER — Ambulatory Visit: Payer: Medicaid Other | Attending: Internal Medicine | Admitting: Internal Medicine

## 2020-04-09 VITALS — BP 107/66 | HR 63 | Resp 16 | Wt 248.0 lb

## 2020-04-09 DIAGNOSIS — I5022 Chronic systolic (congestive) heart failure: Secondary | ICD-10-CM

## 2020-04-09 DIAGNOSIS — Z794 Long term (current) use of insulin: Secondary | ICD-10-CM | POA: Diagnosis not present

## 2020-04-09 DIAGNOSIS — E669 Obesity, unspecified: Secondary | ICD-10-CM | POA: Diagnosis not present

## 2020-04-09 DIAGNOSIS — N182 Chronic kidney disease, stage 2 (mild): Secondary | ICD-10-CM

## 2020-04-09 DIAGNOSIS — L858 Other specified epidermal thickening: Secondary | ICD-10-CM | POA: Diagnosis not present

## 2020-04-09 DIAGNOSIS — E118 Type 2 diabetes mellitus with unspecified complications: Secondary | ICD-10-CM

## 2020-04-09 DIAGNOSIS — Z6379 Other stressful life events affecting family and household: Secondary | ICD-10-CM

## 2020-04-09 DIAGNOSIS — Z2821 Immunization not carried out because of patient refusal: Secondary | ICD-10-CM

## 2020-04-09 DIAGNOSIS — E1169 Type 2 diabetes mellitus with other specified complication: Secondary | ICD-10-CM | POA: Diagnosis not present

## 2020-04-09 LAB — POCT GLYCOSYLATED HEMOGLOBIN (HGB A1C): HbA1c, POC (controlled diabetic range): 8.7 % — AB (ref 0.0–7.0)

## 2020-04-09 LAB — GLUCOSE, POCT (MANUAL RESULT ENTRY): POC Glucose: 218 mg/dl — AB (ref 70–99)

## 2020-04-09 NOTE — Patient Instructions (Signed)
Tell your nephrologist that we want to add Farxiga to the low dose Januvia.  Let me know if kidney function still okay to do so.   Diabetes Mellitus and Nutrition, Adult When you have diabetes (diabetes mellitus), it is very important to have healthy eating habits because your blood sugar (glucose) levels are greatly affected by what you eat and drink. Eating healthy foods in the appropriate amounts, at about the same times every day, can help you:  Control your blood glucose.  Lower your risk of heart disease.  Improve your blood pressure.  Reach or maintain a healthy weight. Every person with diabetes is different, and each person has different needs for a meal plan. Your health care provider may recommend that you work with a diet and nutrition specialist (dietitian) to make a meal plan that is best for you. Your meal plan may vary depending on factors such as:  The calories you need.  The medicines you take.  Your weight.  Your blood glucose, blood pressure, and cholesterol levels.  Your activity level.  Other health conditions you have, such as heart or kidney disease. How do carbohydrates affect me? Carbohydrates, also called carbs, affect your blood glucose level more than any other type of food. Eating carbs naturally raises the amount of glucose in your blood. Carb counting is a method for keeping track of how many carbs you eat. Counting carbs is important to keep your blood glucose at a healthy level, especially if you use insulin or take certain oral diabetes medicines. It is important to know how many carbs you can safely have in each meal. This is different for every person. Your dietitian can help you calculate how many carbs you should have at each meal and for each snack. Foods that contain carbs include:  Bread, cereal, rice, pasta, and crackers.  Potatoes and corn.  Peas, beans, and lentils.  Milk and yogurt.  Fruit and juice.  Desserts, such as cakes,  cookies, ice cream, and candy. How does alcohol affect me? Alcohol can cause a sudden decrease in blood glucose (hypoglycemia), especially if you use insulin or take certain oral diabetes medicines. Hypoglycemia can be a life-threatening condition. Symptoms of hypoglycemia (sleepiness, dizziness, and confusion) are similar to symptoms of having too much alcohol. If your health care provider says that alcohol is safe for you, follow these guidelines:  Limit alcohol intake to no more than 1 drink per day for nonpregnant women and 2 drinks per day for men. One drink equals 12 oz of beer, 5 oz of wine, or 1 oz of hard liquor.  Do not drink on an empty stomach.  Keep yourself hydrated with water, diet soda, or unsweetened iced tea.  Keep in mind that regular soda, juice, and other mixers may contain a lot of sugar and must be counted as carbs. What are tips for following this plan?  Reading food labels  Start by checking the serving size on the "Nutrition Facts" label of packaged foods and drinks. The amount of calories, carbs, fats, and other nutrients listed on the label is based on one serving of the item. Many items contain more than one serving per package.  Check the total grams (g) of carbs in one serving. You can calculate the number of servings of carbs in one serving by dividing the total carbs by 15. For example, if a food has 30 g of total carbs, it would be equal to 2 servings of carbs.  Check the  number of grams (g) of saturated and trans fats in one serving. Choose foods that have low or no amount of these fats.  Check the number of milligrams (mg) of salt (sodium) in one serving. Most people should limit total sodium intake to less than 2,300 mg per day.  Always check the nutrition information of foods labeled as "low-fat" or "nonfat". These foods may be higher in added sugar or refined carbs and should be avoided.  Talk to your dietitian to identify your daily goals for  nutrients listed on the label. Shopping  Avoid buying canned, premade, or processed foods. These foods tend to be high in fat, sodium, and added sugar.  Shop around the outside edge of the grocery store. This includes fresh fruits and vegetables, bulk grains, fresh meats, and fresh dairy. Cooking  Use low-heat cooking methods, such as baking, instead of high-heat cooking methods like deep frying.  Cook using healthy oils, such as olive, canola, or sunflower oil.  Avoid cooking with butter, cream, or high-fat meats. Meal planning  Eat meals and snacks regularly, preferably at the same times every day. Avoid going long periods of time without eating.  Eat foods high in fiber, such as fresh fruits, vegetables, beans, and whole grains. Talk to your dietitian about how many servings of carbs you can eat at each meal.  Eat 4-6 ounces (oz) of lean protein each day, such as lean meat, chicken, fish, eggs, or tofu. One oz of lean protein is equal to: ? 1 oz of meat, chicken, or fish. ? 1 egg. ?  cup of tofu.  Eat some foods each day that contain healthy fats, such as avocado, nuts, seeds, and fish. Lifestyle  Check your blood glucose regularly.  Exercise regularly as told by your health care provider. This may include: ? 150 minutes of moderate-intensity or vigorous-intensity exercise each week. This could be brisk walking, biking, or water aerobics. ? Stretching and doing strength exercises, such as yoga or weightlifting, at least 2 times a week.  Take medicines as told by your health care provider.  Do not use any products that contain nicotine or tobacco, such as cigarettes and e-cigarettes. If you need help quitting, ask your health care provider.  Work with a Social worker or diabetes educator to identify strategies to manage stress and any emotional and social challenges. Questions to ask a health care provider  Do I need to meet with a diabetes educator?  Do I need to meet with a  dietitian?  What number can I call if I have questions?  When are the best times to check my blood glucose? Where to find more information:  American Diabetes Association: diabetes.org  Academy of Nutrition and Dietetics: www.eatright.[employer] of Diabetes and Digestive and Kidney Diseases (NIH): [employer] Summary  A healthy meal plan will help you control your blood glucose and maintain a healthy lifestyle.  Working with a diet and nutrition specialist (dietitian) can help you make a meal plan that is best for you.  Keep in mind that carbohydrates (carbs) and alcohol have immediate effects on your blood glucose levels. It is important to count carbs and to use alcohol carefully. This information is not intended to replace advice given to you by your health care provider. Make sure you discuss any questions you have with your health care provider. Document Revised: 05/08/2017 Document Reviewed: 06/30/2016 Elsevier Patient Education  2020 [employer].

## 2020-04-09 NOTE — Progress Notes (Signed)
Patient ID: [name], male    DOB: May 21, 1966  MRN: [id number]  CC: Diabetes and Hypertension   Subjective: [name] is a 54 y.o. male who presents for chronic disease management. His concerns today include:  Patient with history ofNICM, systolic CHFs/pICD placement,DMtype 2, HL,CKD stage3,obesity, BPH,GERDand chronicLBPsecondary to spondylosis and spinal stenosis, COVID-19 infection.  Reports that he continues to be under a lot of stress dealing with family issues.  Mother had another stroke.  He helps his sister to take care of her.  He also takes care of his twin brother who has schizophrenia Father had skull fracture.  Placed in facility in [place]. He has been constantly on the go for the past 5 mths. Feels very tired To relieve stress, he walks every morning for about an hour but even during his walk sometimes it is interrupted by phone calls from family members.  He also prays and reads his Bible to bring him comfort.     DIABETES TYPE 2 Last A1C:   Results for orders placed or performed in visit on 04/09/20  POCT glucose (manual entry)  Result Value Ref Range   POC Glucose 218 (A) 70 - 99 mg/dl  POCT glycosylated hemoglobin (Hb A1C)  Result Value Ref Range   Hemoglobin A1C     HbA1c POC (<> result, manual entry)     HbA1c, POC (prediabetic range)     HbA1c, POC (controlled diabetic range) 8.7 (A) 0.0 - 7.0 %    Med Adherence:  [x]  Yes.  He is on low-dose Januvia.  His A1c has increased from 7.5 on last visit to 8.7 today. Medication side effects:  []  Yes    [x]  No Home Monitoring?  []  Yes    []  No Home glucose results range: Diet Adherence: :"Horrible."  Only eats once a day but eating late.  He has gained 11 pounds since his last visit in May. Exercise: [x]  Yes he walks daily for 1 hr Hypoglycemic episodes?: []  Yes    [x]  No  CKD 2: He has an appointment coming up in 2 days with his nephrologist.  He had prelab testing done in preparation for  that visit last week.  He states he will be informed of the results when he is seen in person this week.  Last chemistry that I see in the system is from May.  GFR at that time was 60 with creatinine of 1.15  Complains of having a small lump on the right side of his scalp in the temporal parietal area.  It has been there for several months.  He feels it has increased in size.  It is not painful.  Also complains of feeling a lump on the lateral aspect of the dorsal right foot.  Hurts sometimes.  He is due for follow-up visit with his podiatrist.  He plans to call and schedule.   CHF Currently taking: see medication list Med Adherence: [x]  Yes    []  No Medication side effects: []  Yes    [x]  No Adherence with salt restriction: [x]  Yes    []  No Home BP results range: []  Yes    []  No SOB? [x]  Yes    []  No Chest Pain?:  always Leg swelling?: in feet sometimes Headaches?: []  Yes    [x]  No Dizziness? []  Yes    [x]  No Comments:   He declines receiving the flu shot, Pneumovax and Tdap.  He has had the first shot of the [employer]  COVID vaccine.  States he does not plan to get the second shot because he became very sick feeling like he had Covid again after the receiving the first dose.  Patient Active Problem List   Diagnosis Date Noted  . CKD (chronic kidney disease) stage 2, GFR 60-89 ml/min 10/24/2019  . Acute non-recurrent frontal sinusitis 06/27/2019  . COVID-19 virus infection 06/27/2019  . Glaucoma suspect 12/22/2018  . Seasonal and perennial allergic rhinoconjunctivitis 10/25/2018  . 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 Urticaria 05/03/2018  . S/P inguinal hernia repair 03/01/2018  . Unilateral inguinal hernia without obstruction or gangrene 07/24/2017  . 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  . Hypersomnia 02/28/2016  . Abnormal PFT 02/24/2016  . Diabetic polyneuropathy associated with type 2 diabetes mellitus ([place]) 12/18/2015  . Lumbar back pain  12/18/2015  . Controlled type 2 diabetes mellitus with complication, with long-term current use of insulin ([place]) 09/17/2015  . 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 Lesion of right median nerve at forearm 04/16/2015  . ICD (implantable cardioverter-defibrillator) in place 03/28/2014  . Chronic systolic congestive heart failure ([place]) 03/28/2014  . Nonischemic cardiomyopathy ([place]) 11/09/2013  . Syncope 10/20/2013  . Chronic chest pain 07/28/2013  . RENAL CALCULUS 05/13/2009  . Obesity 05/08/2009  . Former heavy cigarette smoker (20-39 per day) 05/08/2009  . HOARSENESS 05/08/2009  . ANXIETY 02/28/2008  . ASTHMATIC BRONCHITIS, ACUTE 02/28/2008  . GERD 02/28/2008  . IRRITABLE BOWEL SYNDROME 02/28/2008     Current Outpatient Medications on File Prior to Visit  Medication Sig Dispense Refill  . ACCU-CHEK FASTCLIX LANCETS MISC Use to check blood sugar 3 times daily. 102 each 11  . aspirin 81 MG tablet Take 81 mg by mouth daily.    Marland Kitchen atorvastatin (LIPITOR) 10 MG tablet Take 1 tablet by mouth once daily 90 tablet 2  . benzonatate (TESSALON) 200 MG capsule Take 1 capsule (200 mg total) by mouth 3 (three) times daily as needed for cough. 40 capsule 0  . carvedilol (COREG) 6.25 MG tablet Take 1 tablet (6.25 mg total) by mouth 2 (two) times daily. 180 tablet 1  . cetirizine (ZYRTEC) 10 MG tablet Take 1 tablet (10 mg total) by mouth daily. 90 tablet 1  . cyclobenzaprine (FLEXERIL) 5 MG tablet Take 1 tablet (5 mg total) by mouth daily as needed for muscle spasms. 90 tablet 1  . EPINEPHrine 0.3 mg/0.3 mL IJ SOAJ injection Use as directed for severe allergic reactions. 2 Device 1  . famotidine (PEPCID) 20 MG tablet Take 1 tablet (20 mg total) by mouth daily. 90 tablet 1  . fluticasone (FLONASE) 50 MCG/ACT nasal spray Use 1-2 sprays in each nostril once a day as needed for nasal congestion. 16 g 3  . furosemide (LASIX) 80 MG tablet Take 1 tablet (80 mg total) by mouth 2 (two) times daily. 180 tablet  3  . gabapentin (NEURONTIN) 300 MG capsule Take 1 capsule (300 mg total) by mouth at bedtime. 90 capsule 3  . glucose blood (ACCU-CHEK GUIDE) test strip Use as instructed to check blood sugar 3 times daily. 100 each 11  . Lancet Devices (ACCU-CHEK SOFTCLIX) lancets 1 each by Other route 3 (three) times daily. 1 each 0  . levalbuterol (XOPENEX HFA) 45 MCG/ACT inhaler Inhale 2 puffs into the lungs every 6 (six) hours as needed for wheezing. 1 Inhaler 1  . mometasone (ELOCON) 0.1 % cream Apply 1 application topically daily. 45 g 1  . sacubitril-valsartan (ENTRESTO)  24-26 MG Take 1 tablet by mouth 2 (two) times daily. 60 tablet 10  . sitaGLIPtin (JANUVIA) 25 MG tablet Take 1 tablet (25 mg total) by mouth daily. 90 tablet 0  . tamsulosin (FLOMAX) 0.4 MG CAPS capsule Take 2 capsules by mouth once daily 180 capsule 3   No current facility-administered medications on file prior to visit.    Allergies  Allergen Reactions  . Bee Venom Anaphylaxis    Social History   Socioeconomic History  . Marital status: Single    Spouse name: Not on file  . Number of children: Not on file  . Years of education: Not on file  . Highest education level: Not on file  Occupational History  . Occupation: Disabled  Tobacco Use  . Smoking status: Former Smoker    Packs/day: 1.00    Years: 28.00    Pack years: 28.00    Types: Cigarettes    Quit date: 06/03/2014    Years since quitting: 5.8  . Smokeless tobacco: Never Used  Vaping Use  . Vaping Use: Never used  Substance and Sexual Activity  . Alcohol use: Yes    Alcohol/week: 0.0 standard drinks    Comment: 12/23/2013 "might drink a couple times/yr"  . Drug use: Yes    Types: Marijuana    Comment: 02/25/18- patient  states has not used marjuana  . Sexual activity: Yes  Other Topics Concern  . Not on file  Social History Narrative   Pt lives with his brother    Social Determinants of Health   Financial Resource Strain:   . Difficulty of Paying  Living Expenses: Not on file  Food Insecurity:   . Worried About Charity fundraiser in the Last Year: Not on file  . Ran Out of Food in the Last Year: Not on file  Transportation Needs:   . Lack of Transportation (Medical): Not on file  . Lack of Transportation (Non-Medical): Not on file  Physical Activity:   . Days of Exercise per Week: Not on file  . Minutes of Exercise per Session: Not on file  Stress:   . Feeling of Stress : Not on file  Social Connections:   . Frequency of Communication with Friends and Family: Not on file  . Frequency of Social Gatherings with Friends and Family: Not on file  . Attends Religious Services: Not on file  . Active Member of Clubs or Organizations: Not on file  . Attends Archivist Meetings: Not on file  . Marital Status: Not on file  Intimate Partner Violence:   . Fear of Current or Ex-Partner: Not on file  . Emotionally Abused: Not on file  . Physically Abused: Not on file  . Sexually Abused: Not on file    Family History  Problem Relation Age of Onset  . Diabetes Father   . Arthritis Father   . Hypertension Father   . Diabetes Mother   . Arthritis Mother   . Hypertension Mother   . Hypertension Brother     Past Surgical History:  Procedure Laterality Date  . CARDIAC CATHETERIZATION  08/10/13  . CHOLECYSTECTOMY  ~ 2010  . IMPLANTABLE CARDIOVERTER DEFIBRILLATOR IMPLANT  12/23/2013   STJ Fortify ICD implanted by [name] for cardiomyopathy and syncope  . IMPLANTABLE CARDIOVERTER DEFIBRILLATOR IMPLANT N/A 12/23/2013   Procedure: IMPLANTABLE CARDIOVERTER DEFIBRILLATOR IMPLANT;  Surgeon: Coralyn Mark, MD;  Location: [facility] CATH [facility];  Service: Cardiovascular;  Laterality: N/A;  . INGUINAL HERNIA REPAIR  Left 03/01/2018   Procedure: OPEN REPAIR OF LEFT INGUINAL HERNIA WITH MESH;  Surgeon: Greer Pickerel, MD;  Location: [facility];  Service: General;  Laterality: Left;  . LEFT HEART CATHETERIZATION WITH CORONARY ANGIOGRAM N/A 08/10/2013    Procedure: LEFT HEART CATHETERIZATION WITH CORONARY ANGIOGRAM;  Surgeon: Jettie Booze, MD;  Location: [facility];  Service: Cardiovascular;  Laterality: N/A;  . ORIF HUMERUS FRACTURE Left 08/12/2013   Procedure: OPEN REDUCTION INTERNAL FIXATION (ORIF) HUMERAL SHAFT FRACTURE;  Surgeon: Newt Minion, MD;  Location: [facility];  Service: Orthopedics;  Laterality: Left;  Open Reduction Internal Fixation Left Humerus  . URETEROSCOPY     "laser for kidney stones"    ROS: Review of Systems Negative except as stated above  PHYSICAL EXAM: BP 107/66   Pulse 63   Resp 16   Wt 248 lb (112.5 kg)   SpO2 97%   BMI 33.63 kg/m   Wt Readings from Last 3 Encounters:  04/09/20 248 lb (112.5 kg)  11/02/19 237 lb 9.6 oz (107.8 kg)  10/31/19 235 lb 3.2 oz (106.7 kg)    Physical Exam  General appearance - alert, well appearing, middle-aged African-American male and in no distress Mental status - normal mood, behavior, speech, dress, motor activity, and thought processes Neck - supple, no significant adenopathy Chest -breath sounds slightly decreased bilaterally.  No crackles heard.   Heart - normal rate, regular rhythm, normal S1, S2, no murmurs, rubs, clicks or gallops Extremities -no lower extremity edema Skin -small cutaneous horn noted on the scalp in the right temporoparietal area. Diabetic Foot Exam - Simple   Simple Foot Form Visual Inspection See comments: Yes Sensation Testing Intact to touch and monofilament testing bilaterally: Yes Pulse Check Posterior Tibialis and Dorsalis pulse intact bilaterally: Yes Comments Small pea-sized bony prominence palpated on the dorsal lateral midfoot of the right foot.  It is not tender to touch.      Depression screen [facility] 2/9 04/09/2020 10/24/2019 03/24/2019  Decreased Interest 1 1 0  Down, Depressed, Hopeless 0 1 0  PHQ - 2 Score 1 2 0  Altered sleeping - 1 -  Tired, decreased energy - 1 -  Change in appetite - 0 -  Feeling bad or failure  about yourself  - 0 -  Trouble concentrating - 0 -  Moving slowly or fidgety/restless - 0 -  Suicidal thoughts - 0 -  PHQ-9 Score - 4 -  Some recent data might be hidden   GAD 7 : Generalized Anxiety Score 04/09/2020 10/24/2019 03/24/2019 12/21/2018  Nervous, Anxious, on Edge 1 1 1  0  Control/stop worrying 1 1 1  0  Worry too much - different things 1 1 0 0  Trouble relaxing 0 1 1 0  Restless 0 0 0 0  Easily annoyed or irritable 1 1 1  0  Afraid - awful might happen 0 1 0 0  Total GAD 7 Score 4 6 4  0     CMP Latest Ref Rng & Units 10/14/2019 10/13/2018 06/28/2018  Glucose 65 - 99 mg/dL 130(H) 118(H) 168(H)  BUN 6 - 24 mg/dL 11 38(H) 18  Creatinine 0.76 - 1.27 mg/dL 1.51(H) 2.75(H) 1.66(H)  Sodium 134 - 144 mmol/L 148(H) 142 142  Potassium 3.5 - 5.2 mmol/L 3.8 4.4 4.0  Chloride 96 - 106 mmol/L 104 103 102  CO2 20 - 29 mmol/L 25 24 24   Calcium 8.7 - 10.2 mg/dL 9.7 9.5 9.3  Total Protein 6.0 - 8.5 g/dL 7.4 -  7.0  Total Bilirubin 0.0 - 1.2 mg/dL 0.4 - 0.4  Alkaline Phos 39 - 117 IU/L 74 - 50  AST 0 - 40 IU/L 15 - 14  ALT 0 - 44 IU/L 13 - 12   Lipid Panel     Component Value Date/Time   CHOL 115 10/14/2019 0000   TRIG 66 10/14/2019 0000   HDL 44 10/14/2019 0000   CHOLHDL 2.6 10/14/2019 0000   CHOLHDL 4 05/08/2009 1040   VLDL 27.0 05/08/2009 1040   LDLCALC 57 10/14/2019 0000    CBC    Component Value Date/Time   WBC 4.5 06/28/2018 1440   WBC 4.7 02/26/2018 1134   RBC 4.17 06/28/2018 1440   RBC 4.06 (L) 02/26/2018 1134   HGB 12.5 (L) 06/28/2018 1440   HCT 36.9 (L) 06/28/2018 1440   PLT 301 06/28/2018 1440   MCV 89 06/28/2018 1440   MCH 30.0 06/28/2018 1440   MCH 30.0 02/26/2018 1134   MCHC 33.9 06/28/2018 1440   MCHC 33.3 02/26/2018 1134   RDW 13.4 06/28/2018 1440   LYMPHSABS 1.7 06/28/2018 1440   MONOABS 0.6 09/21/2017 2158   EOSABS 0.3 06/28/2018 1440   BASOSABS 0.0 06/28/2018 1440    ASSESSMENT AND PLAN: 1. Type 2 diabetes mellitus with obesity ([place]) Even  though kidney function had improved, I am a bit skeptical about putting him back on Metformin.  We discussed trying him either with a low-dose of Lantus insulin or adding [place].  Informed patient of the benefits of SGLT in patients with CHF and also with CKD.  He will wait until he sees his nephrologist in 2 days to see what his kidney function looks like first.  He will send me a MyChart message after he is seeing the nephrologist so that I can send the prescription for him.  Other option is Trulicity if his kidney function is okay.  We will have the added benefit of achieving some weight loss.  However patient wants to hold off on trying any injectables. Encourage healthy eating habits.  Encouraged him to continue exercising regularly. - POCT glucose (manual entry) - POCT glycosylated hemoglobin (Hb A1C)  2. Stressful life event affecting family Encourage him to crave out time and every day for himself to relax.  We discussed signs of caregiver burnout and ways to prevent it.  He declines referral to a counselor.  3. Chronic systolic congestive heart failure (HCC) Clinically stable.  Followed by cardiology.  Continue Entresto, carvedilol and furosemide.  4. CKD (chronic kidney disease) stage 2, GFR 60-89 ml/min Has follow-up with nephrology in 2 days.  5. Cutaneous horn - Ambulatory referral to Dermatology  6. Influenza vaccination declined  7. 23-polyvalent pneumococcal polysaccharide vaccine declined   8. Tetanus, diphtheria, and acellular pertussis (Tdap) vaccination declined      Patient was given the opportunity to ask questions.  Patient verbalized understanding of the plan and was able to repeat key elements of the plan.   Orders Placed This Encounter  Procedures  . Ambulatory referral to Dermatology  . POCT glucose (manual entry)  . POCT glycosylated hemoglobin (Hb A1C)     Requested Prescriptions    No prescriptions requested or ordered in this encounter     Return in about 3 months (around 07/10/2020) for in person.  Karle Plumber, MD, FACP

## 2020-04-11 DIAGNOSIS — N400 Enlarged prostate without lower urinary tract symptoms: Secondary | ICD-10-CM | POA: Diagnosis not present

## 2020-04-11 DIAGNOSIS — I428 Other cardiomyopathies: Secondary | ICD-10-CM | POA: Diagnosis not present

## 2020-04-11 DIAGNOSIS — D649 Anemia, unspecified: Secondary | ICD-10-CM | POA: Diagnosis not present

## 2020-04-11 DIAGNOSIS — N2581 Secondary hyperparathyroidism of renal origin: Secondary | ICD-10-CM | POA: Diagnosis not present

## 2020-04-11 DIAGNOSIS — E1122 Type 2 diabetes mellitus with diabetic chronic kidney disease: Secondary | ICD-10-CM | POA: Diagnosis not present

## 2020-04-11 DIAGNOSIS — N1831 Chronic kidney disease, stage 3a: Secondary | ICD-10-CM | POA: Diagnosis not present

## 2020-04-12 ENCOUNTER — Telehealth: Payer: Self-pay | Admitting: Internal Medicine

## 2020-04-12 NOTE — Telephone Encounter (Signed)
Called to leave a message for Dr. Durenda Age nurse that per Dr. Royce Macadamia, it will be alright to add the medication, Farxiga to patient's medication list.  Any questions, please call at [phone]

## 2020-04-14 ENCOUNTER — Encounter: Payer: Self-pay | Admitting: Internal Medicine

## 2020-04-14 MED ORDER — DAPAGLIFLOZIN PROPANEDIOL 5 MG PO TABS: 5.0000 mg | ORAL_TABLET | Freq: Every day | ORAL | 5 refills | Status: DC

## 2020-04-14 NOTE — Telephone Encounter (Signed)
Rxn for Farxiga sent to his pharmacy.  Pt sent Mychart message.

## 2020-04-26 NOTE — Progress Notes (Signed)
HPI: FU CM/CHF. Echo 2/15 revealed EF of 20-25% with diffuse hypokinesis and biatrial enlargement, mild MR and a small pericardial effusion. TSH in February 2015 normal. Cardiac catheterization in March of 2015 showed normal coronary arteries.Also with prior ICD. CPX August 2017 showed submaximal effort with moderate functional impairment primarily circulatory limited.Echocardiogram repeated September 2020. Ejection fraction 45 to 50%, moderate left ventricular hypertrophy, grade 1 diastolic dysfunction, mild left atrial enlargement. Since last seen,he occasionally has dyspnea but is able to walk and occasionally jog.  No orthopnea, PND, pedal edema, exertional chest pain or syncope.  He continues to have an occasional pain in his chest not related to exertion which is longstanding.  Current Outpatient Medications  Medication Sig Dispense Refill  . ACCU-CHEK FASTCLIX LANCETS MISC Use to check blood sugar 3 times daily. 102 each 11  . aspirin 81 MG tablet Take 81 mg by mouth daily.    Marland Kitchen atorvastatin (LIPITOR) 10 MG tablet Take 1 tablet by mouth once daily 90 tablet 2  . benzonatate (TESSALON) 200 MG capsule Take 1 capsule (200 mg total) by mouth 3 (three) times daily as needed for cough. 40 capsule 0  . carvedilol (COREG) 6.25 MG tablet Take 1 tablet (6.25 mg total) by mouth 2 (two) times daily. 180 tablet 1  . cetirizine (ZYRTEC) 10 MG tablet Take 1 tablet (10 mg total) by mouth daily. 90 tablet 1  . cyclobenzaprine (FLEXERIL) 5 MG tablet Take 1 tablet (5 mg total) by mouth daily as needed for muscle spasms. 90 tablet 1  . dapagliflozin propanediol (FARXIGA) 5 MG TABS tablet Take 1 tablet (5 mg total) by mouth daily before breakfast. 30 tablet 5  . EPINEPHrine 0.3 mg/0.3 mL IJ SOAJ injection Use as directed for severe allergic reactions. 2 Device 1  . famotidine (PEPCID) 20 MG tablet Take 1 tablet (20 mg total) by mouth daily. 90 tablet 1  . fluticasone (FLONASE) 50 MCG/ACT nasal spray  Use 1-2 sprays in each nostril once a day as needed for nasal congestion. 16 g 3  . furosemide (LASIX) 80 MG tablet Take 1 tablet (80 mg total) by mouth 2 (two) times daily. 180 tablet 3  . gabapentin (NEURONTIN) 300 MG capsule Take 1 capsule (300 mg total) by mouth at bedtime. 90 capsule 3  . glucose blood (ACCU-CHEK GUIDE) test strip Use as instructed to check blood sugar 3 times daily. 100 each 11  . Lancet Devices (ACCU-CHEK SOFTCLIX) lancets 1 each by Other route 3 (three) times daily. 1 each 0  . levalbuterol (XOPENEX HFA) 45 MCG/ACT inhaler Inhale 2 puffs into the lungs every 6 (six) hours as needed for wheezing. 1 Inhaler 1  . mometasone (ELOCON) 0.1 % cream Apply 1 application topically daily. 45 g 1  . sacubitril-valsartan (ENTRESTO) 24-26 MG Take 1 tablet by mouth 2 (two) times daily. 60 tablet 10  . sitaGLIPtin (JANUVIA) 25 MG tablet Take 1 tablet (25 mg total) by mouth daily. 90 tablet 0  . tamsulosin (FLOMAX) 0.4 MG CAPS capsule Take 2 capsules by mouth once daily 180 capsule 3   No current facility-administered medications for this visit.     Past Medical History:  Diagnosis Date  . AICD (automatic cardioverter/defibrillator) present   . Chronic bronchitis ([place])    "get it most q yr" (12/23/2013)  . Chronic systolic (congestive) heart failure ([place])   . Fracture of left humerus    a. 07/2013.  Marland Kitchen GERD (gastroesophageal reflux disease)  not at present time  . Heart murmur    "born w/it"   . History of renal calculi   . Hypertension   . Kidney stones   . NICM (nonischemic cardiomyopathy) ([place])    a. 07/2013 Echo: EF 20-25%, diff HK, Gr2 DD, mild MR, mod dil LA/RA. EF 40% 2017 echo  . Obesity   . 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 Syncope   . Type II diabetes mellitus ([place]) 2006    Past Surgical History:  Procedure Laterality Date  . CARDIAC  CATHETERIZATION  08/10/13  . CHOLECYSTECTOMY  ~ 2010  . IMPLANTABLE CARDIOVERTER DEFIBRILLATOR IMPLANT  12/23/2013   STJ Fortify ICD implanted by [name] for cardiomyopathy and syncope  . IMPLANTABLE CARDIOVERTER DEFIBRILLATOR IMPLANT N/A 12/23/2013   Procedure: IMPLANTABLE CARDIOVERTER DEFIBRILLATOR IMPLANT;  Surgeon: Coralyn Mark, MD;  Location: [facility] CATH [facility];  Service: Cardiovascular;  Laterality: N/A;  . INGUINAL HERNIA REPAIR Left 03/01/2018   Procedure: OPEN REPAIR OF LEFT INGUINAL HERNIA WITH MESH;  Surgeon: Greer Pickerel, MD;  Location: [facility];  Service: General;  Laterality: Left;  . LEFT HEART CATHETERIZATION WITH CORONARY ANGIOGRAM N/A 08/10/2013   Procedure: LEFT HEART CATHETERIZATION WITH CORONARY ANGIOGRAM;  Surgeon: Jettie Booze, MD;  Location: [facility] CATH LAB;  Service: Cardiovascular;  Laterality: N/A;  . ORIF HUMERUS FRACTURE Left 08/12/2013   Procedure: OPEN REDUCTION INTERNAL FIXATION (ORIF) HUMERAL SHAFT FRACTURE;  Surgeon: Newt Minion, MD;  Location: [facility];  Service: Orthopedics;  Laterality: Left;  Open Reduction Internal Fixation Left Humerus  . URETEROSCOPY     "laser for kidney stones"    Social History   Socioeconomic History  . Marital status: Single    Spouse name: Not on file  . Number of children: Not on file  . Years of education: Not on file  . Highest education level: Not on file  Occupational History  . Occupation: Disabled  Tobacco Use  . Smoking status: Former Smoker    Packs/day: 1.00    Years: 28.00    Pack years: 28.00    Types: Cigarettes    Quit date: 06/03/2014    Years since quitting: 5.9  . Smokeless tobacco: Never Used  Vaping Use  . Vaping Use: Never used  Substance and Sexual Activity  . Alcohol use: Yes    Alcohol/week: 0.0 standard drinks    Comment: 12/23/2013 "might drink a couple times/yr"  . Drug use: Yes    Types: Marijuana    Comment: 02/25/18- patient  states has not used marjuana  . Sexual activity: Yes  Other Topics  Concern  . Not on file  Social History Narrative   Pt lives with his brother    Social Determinants of Health   Financial Resource Strain:   . Difficulty of Paying Living Expenses: Not on file  Food Insecurity:   . Worried About Charity fundraiser in the Last Year: Not on file  . Ran Out of Food in the Last Year: Not on file  Transportation Needs:   . Lack of Transportation (Medical): Not on file  . Lack of Transportation (Non-Medical): Not on file  Physical Activity:   . Days of Exercise per Week: Not on file  . Minutes of Exercise per Session: Not on file  Stress:   . Feeling of Stress : Not on file  Social Connections:   .  Frequency of Communication with Friends and Family: Not on file  . Frequency of Social Gatherings with Friends and Family: Not on file  . Attends Religious Services: Not on file  . Active Member of Clubs or Organizations: Not on file  . Attends Archivist Meetings: Not on file  . Marital Status: Not on file  Intimate Partner Violence:   . Fear of Current or Ex-Partner: Not on file  . Emotionally Abused: Not on file  . Physically Abused: Not on file  . Sexually Abused: Not on file    Family History  Problem Relation Age of Onset  . Diabetes Father   . Arthritis Father   . Hypertension Father   . Diabetes Mother   . Arthritis Mother   . Hypertension Mother   . Hypertension Brother     ROS: no fevers or chills, productive cough, hemoptysis, dysphasia, odynophagia, melena, hematochezia, dysuria, hematuria, rash, seizure activity, orthopnea, PND, pedal edema, claudication. Remaining systems are negative.  Physical Exam: Well-developed well-nourished in no acute distress.  Skin is warm and dry.  HEENT is normal.  Neck is supple.  Chest is clear to auscultation with normal expansion.  Cardiovascular exam is regular rate and rhythm.  Abdominal exam nontender or distended. No masses palpated. Extremities show no edema. neuro grossly  intact  ECG-sinus rhythm at a rate of 62, left axis deviation, cannot rule out septal infarct, first-degree AV block.,  Nonspecific ST changes.  Personally reviewed  A/P  1 nonischemic cardiomyopathy-continue Entresto and carvedilol.  2 chronic systolic congestive heart failure-patient does not appear to be volume overloaded on examination.  Continue Lasix at present dose.  Will obtain most recent laboratories including potassium and renal function from nephrology.  3 chronic chest pain-previous catheterization revealed no coronary disease.  His symptoms are atypical.  Electrocardiogram shows no new ST changes.  Will not pursue further ischemia evaluation.  4 hyperlipidemia-continue statin.  5 prior ICD-followed by electrophysiology.  6 chronic stage III kidney disease-managed by nephrology.  [name], MD

## 2020-05-07 ENCOUNTER — Encounter: Payer: Self-pay | Admitting: Cardiology

## 2020-05-07 ENCOUNTER — Other Ambulatory Visit: Payer: Self-pay

## 2020-05-07 ENCOUNTER — Ambulatory Visit (INDEPENDENT_AMBULATORY_CARE_PROVIDER_SITE_OTHER): Payer: Medicaid Other | Admitting: Cardiology

## 2020-05-07 VITALS — BP 94/56 | HR 62 | Ht 72.0 in | Wt 238.0 lb

## 2020-05-07 DIAGNOSIS — Z95810 Presence of automatic (implantable) cardiac defibrillator: Secondary | ICD-10-CM | POA: Diagnosis not present

## 2020-05-07 DIAGNOSIS — I10 Essential (primary) hypertension: Secondary | ICD-10-CM

## 2020-05-07 DIAGNOSIS — I5042 Chronic combined systolic (congestive) and diastolic (congestive) heart failure: Secondary | ICD-10-CM

## 2020-05-07 DIAGNOSIS — I5022 Chronic systolic (congestive) heart failure: Secondary | ICD-10-CM

## 2020-05-07 DIAGNOSIS — I428 Other cardiomyopathies: Secondary | ICD-10-CM | POA: Diagnosis not present

## 2020-05-07 NOTE — Patient Instructions (Signed)

## 2020-05-09 ENCOUNTER — Encounter: Payer: Self-pay | Admitting: Internal Medicine

## 2020-05-09 NOTE — Progress Notes (Signed)
See if note from Dr. Reeves Dam with [facility].  Patient seen 05/08/2020. Labs done 04/05/2020: Creatinine 1.72, GFR 44-51, K3.5, PTH 89, vitamin D 26, hemoglobin 11.6. 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  UA without protein and no red blood cells. Normocytic anemia may be secondary to CKD.  Iron is replete Vitamin D deficiency with secondary hyperparathyroidism.  Start vitamin D 1000 units daily.

## 2020-05-10 ENCOUNTER — Encounter: Payer: Self-pay | Admitting: Internal Medicine

## 2020-05-10 DIAGNOSIS — M79641 Pain in right hand: Secondary | ICD-10-CM

## 2020-05-10 DIAGNOSIS — M25562 Pain in left knee: Secondary | ICD-10-CM

## 2020-05-11 ENCOUNTER — Telehealth: Payer: Self-pay

## 2020-05-11 NOTE — Telephone Encounter (Signed)
The pt agreed to send missed transmission in a few minutes.

## 2020-05-14 ENCOUNTER — Other Ambulatory Visit: Payer: Self-pay

## 2020-05-14 ENCOUNTER — Ambulatory Visit (INDEPENDENT_AMBULATORY_CARE_PROVIDER_SITE_OTHER): Payer: Medicaid Other | Admitting: Allergy

## 2020-05-14 ENCOUNTER — Encounter: Payer: Self-pay | Admitting: Allergy

## 2020-05-14 VITALS — BP 104/66 | HR 68 | Temp 98.0°F | Resp 14 | Ht 72.0 in | Wt 236.6 lb

## 2020-05-14 DIAGNOSIS — J302 Other seasonal allergic rhinitis: Secondary | ICD-10-CM

## 2020-05-14 DIAGNOSIS — T782XXD Anaphylactic shock, unspecified, subsequent encounter: Secondary | ICD-10-CM | POA: Diagnosis not present

## 2020-05-14 DIAGNOSIS — H1010 Acute atopic conjunctivitis, unspecified eye: Secondary | ICD-10-CM

## 2020-05-14 DIAGNOSIS — J3089 Other allergic rhinitis: Secondary | ICD-10-CM | POA: Diagnosis not present

## 2020-05-14 DIAGNOSIS — R0602 Shortness of breath: Secondary | ICD-10-CM

## 2020-05-14 DIAGNOSIS — T63481D Toxic effect of venom of other arthropod, accidental (unintentional), subsequent encounter: Secondary | ICD-10-CM | POA: Diagnosis not present

## 2020-05-14 DIAGNOSIS — L509 Urticaria, unspecified: Secondary | ICD-10-CM

## 2020-05-14 MED ORDER — LEVALBUTEROL TARTRATE 45 MCG/ACT IN AERO: 2.0000 | INHALATION_SPRAY | Freq: Four times a day (QID) | RESPIRATORY_TRACT | 1 refills | Status: DC | PRN

## 2020-05-14 MED ORDER — FLUTICASONE PROPIONATE 50 MCG/ACT NA SUSP: NASAL | 5 refills | Status: DC

## 2020-05-14 MED ORDER — EPINEPHRINE 0.3 MG/0.3ML IJ SOAJ
INTRAMUSCULAR | 2 refills | Status: DC
Start: 2020-05-14 — End: 2020-11-12

## 2020-05-14 MED ORDER — CETIRIZINE HCL 10 MG PO TABS
10.0000 mg | ORAL_TABLET | Freq: Every day | ORAL | 2 refills | Status: DC
Start: 2020-05-14 — End: 2020-11-12

## 2020-05-14 MED ORDER — FAMOTIDINE 20 MG PO TABS
20.0000 mg | ORAL_TABLET | Freq: Every day | ORAL | 2 refills | Status: DC
Start: 2020-05-14 — End: 2020-11-12

## 2020-05-14 NOTE — Progress Notes (Signed)
Follow Up Note  RE: [name] MRN: [id number] DOB: 08/23/1965 Date of Office Visit: 05/14/2020  Referring provider: [name], MD Primary care provider: [name], MD  Chief Complaint: Urticaria (No concerns. The medication is working. No problems while on the medicine)  History of Present Illness: I had the pleasure of seeing [name] for a follow up visit at the [facility] [place] [facility] [place] on 05/14/2020. [name] is a 54 y.o. male, who is being followed for urticaria, allergic rhino conjunctivitis, hymenoptera allergy. His previous allergy office visit was on 11/02/2019 with Dr. Maudie Mercury. Today is a regular follow up visit.  Urticaria Currently on zyrtec 10mg  at night and Pepcid 20mg  in the morning. Sometimes if [name] misses a dose feels the itching come on and if [name] misses 2 days then [name] will have an outbreak.  No recent outbreaks as [name] hasn't missed a dose recently.  Seasonal and perennial allergic rhinoconjunctivitis Noted some sneezing and coughing as his house is being renovated right now. Not needed to use nasal sprays or eye drops. Had to take an additional antihistamine last night.   Anaphylaxis due to hymenoptera venom No stings since the las visit. Needs refill on Epipen as current one expired.  Shortness of breath Using xopenex on very rare occasions - 1-2 time per year with good benefit for shortness of breath.  Follows with cardiology for CHF, nephrology for CKD.   Assessment and Plan: [name] is a 54 y.o. male with: Urticaria Past history - Rash/hives for 3-4 years but worse the last 7 months now having flat rash lasting up to 1 day. Describes them as pruritic and occurring on a daily basis. No triggers identified. Tried benadryl, zyrtec and Claritin with no benefit. Used oral steroids with minimal benefit. Interim history - no hives with zyrtec 10mg  QHS and Pepcid 20mg  QAM. Sometimes notices pruritus and hives if misses few doses of medications.   Continue Zyrtec (ceterizine) 10mg  once a day at night.   Try to wean down on the Pepcid (famotidine) 20mg  once a day in the morning to every other day.  If no itching/rash/hives then you may try to stop after 1 month.   Take pictures when it flares.  Avoid the following potential triggers: alcohol, tight clothing, NSAIDs.  Seasonal and perennial allergic rhinoconjunctivitis Past history - Bloodwork was positive to dust mites, cat, dog, grass. Borderline positive to mold, cockroach and tree pollen. Interim history - stable with daily zyrtec daily.  Continue zyrtec 10mg  daily.  May use Flonase 1-2 sprays in each nostril once a day as needed for nasal symptoms.   Continue environmental control measures.  Anaphylaxis due to hymenoptera venom Past history - Anaphylactic reaction to hymenoptera in the past and apparently was on allergy immunotherapy as a child for this. Most recently got stung a few years ago which required epinephrine use. Bloodwork was borderline positive to paper wasp. Interim history - No stings since last visit.   Avoid insect stings.  For mild symptoms you can take over the counter antihistamines such as Benadryl and monitor symptoms closely. If symptoms worsen or if you have severe symptoms including breathing issues, throat closure, significant swelling, whole body hives, severe diarrhea and vomiting, lightheadedness then inject epinephrine and seek immediate medical care afterwards.  Epinephrine refilled.  Shortness of breath Uses xopenex 1-2 times per year for shortness of breath with good benefit.  May use levoalbuterol rescue inhaler 2 puffs every 4 to 6 hours  as needed for shortness of breath, chest tightness, coughing, and wheezing. Monitor frequency of use.   Get spirometry at next visit.   Return in about 6 months (around 11/12/2020).  Meds ordered this encounter  Medications  . levalbuterol (XOPENEX HFA) 45 MCG/ACT inhaler    Sig: Inhale 2 puffs  into the lungs every 6 (six) hours as needed for wheezing or shortness of breath.    Dispense:  15 g    Refill:  1  . EPINEPHrine 0.3 mg/0.3 mL IJ SOAJ injection    Sig: Use as directed for severe allergic reactions.    Dispense:  2 each    Refill:  2  . famotidine (PEPCID) 20 MG tablet    Sig: Take 1 tablet (20 mg total) by mouth daily.    Dispense:  90 tablet    Refill:  2  . cetirizine (ZYRTEC) 10 MG tablet    Sig: Take 1 tablet (10 mg total) by mouth daily.    Dispense:  90 tablet    Refill:  2  . fluticasone (FLONASE) 50 MCG/ACT nasal spray    Sig: Use 1-2 sprays in each nostril once a day as needed for nasal congestion.    Dispense:  16 g    Refill:  5   Diagnostics: None.   Medication List:  Current Outpatient Medications  Medication Sig Dispense Refill  . aspirin 81 MG tablet Take 81 mg by mouth daily.    Marland Kitchen atorvastatin (LIPITOR) 10 MG tablet Take 1 tablet by mouth once daily 90 tablet 2  . benzonatate (TESSALON) 200 MG capsule Take 1 capsule (200 mg total) by mouth 3 (three) times daily as needed for cough. 40 capsule 0  . carvedilol (COREG) 6.25 MG tablet Take 1 tablet (6.25 mg total) by mouth 2 (two) times daily. 180 tablet 1  . cetirizine (ZYRTEC) 10 MG tablet Take 1 tablet (10 mg total) by mouth daily. 90 tablet 2  . cyclobenzaprine (FLEXERIL) 5 MG tablet Take 1 tablet (5 mg total) by mouth daily as needed for muscle spasms. 90 tablet 1  . dapagliflozin propanediol (FARXIGA) 5 MG TABS tablet Take 1 tablet (5 mg total) by mouth daily before breakfast. 30 tablet 5  . EPINEPHrine 0.3 mg/0.3 mL IJ SOAJ injection Use as directed for severe allergic reactions. 2 each 2  . famotidine (PEPCID) 20 MG tablet Take 1 tablet (20 mg total) by mouth daily. 90 tablet 2  . fluticasone (FLONASE) 50 MCG/ACT nasal spray Use 1-2 sprays in each nostril once a day as needed for nasal congestion. 16 g 5  . furosemide (LASIX) 80 MG tablet Take 1 tablet (80 mg total) by mouth 2 (two) times  daily. 180 tablet 3  . gabapentin (NEURONTIN) 300 MG capsule Take 1 capsule (300 mg total) by mouth at bedtime. 90 capsule 3  . levalbuterol (XOPENEX HFA) 45 MCG/ACT inhaler Inhale 2 puffs into the lungs every 6 (six) hours as needed for wheezing or shortness of breath. 15 g 1  . mometasone (ELOCON) 0.1 % cream Apply 1 application topically daily. 45 g 1  . sacubitril-valsartan (ENTRESTO) 24-26 MG Take 1 tablet by mouth 2 (two) times daily. 60 tablet 10  . sitaGLIPtin (JANUVIA) 25 MG tablet Take 1 tablet (25 mg total) by mouth daily. 90 tablet 0  . tamsulosin (FLOMAX) 0.4 MG CAPS capsule Take 2 capsules by mouth once daily 180 capsule 3  . ACCU-CHEK FASTCLIX LANCETS MISC Use to check blood sugar 3 times  daily. (Patient not taking: Reported on 05/14/2020) 102 each 11  . glucose blood (ACCU-CHEK GUIDE) test strip Use as instructed to check blood sugar 3 times daily. (Patient not taking: Reported on 05/14/2020) 100 each 11  . Lancet Devices (ACCU-CHEK SOFTCLIX) lancets 1 each by Other route 3 (three) times daily. (Patient not taking: Reported on 05/14/2020) 1 each 0   No current facility-administered medications for this visit.   Allergies: Allergies  Allergen Reactions  . Bee Venom Anaphylaxis   I reviewed his past medical history, social history, family history, and environmental history and no significant changes have been reported from his previous visit.  Review of Systems  Constitutional: Negative for appetite change, chills, fever and unexpected weight change.  HENT: Negative for congestion and rhinorrhea.   Eyes: Negative for itching.  Respiratory: Negative for cough, chest tightness, shortness of breath and wheezing.   Cardiovascular: Negative for chest pain.  Gastrointestinal: Negative for abdominal pain.  Genitourinary: Negative for difficulty urinating.  Skin: Negative for rash.  Allergic/Immunologic: Positive for environmental allergies. Negative for food allergies.   Neurological: Negative for headaches.   Objective: BP 104/66   Pulse 68   Temp 98 F (36.7 C)   Resp 14   Ht 6' (1.829 m)   Wt 236 lb 9.6 oz (107.3 kg)   SpO2 96%   BMI 32.09 kg/m  Body mass index is 32.09 kg/m. Physical Exam Vitals and nursing note reviewed.  Constitutional:      Appearance: [name] is well-developed.  HENT:     Head: Normocephalic and atraumatic.     Right Ear: External ear normal.     Left Ear: External ear normal.     Nose: Nose normal.  Eyes:     Conjunctiva/sclera: Conjunctivae normal.  Cardiovascular:     Rate and Rhythm: Normal rate and regular rhythm.     Heart sounds: Normal heart sounds. No murmur heard.  No friction rub. No gallop.   Pulmonary:     Effort: Pulmonary effort is normal.     Breath sounds: Normal breath sounds. No wheezing or rales.  Musculoskeletal:     Cervical back: Neck supple.  Skin:    General: Skin is warm.     Findings: No rash.  Neurological:     Mental Status: [name] is alert and oriented to person, place, and time.  Psychiatric:        Behavior: Behavior normal.    Previous notes and tests were reviewed. The plan was reviewed with the patient/family, and all questions/concerned were addressed.  It was my pleasure to see [name] today and participate in his care. Please feel free to contact me with any questions or concerns.  Sincerely,  Rexene Alberts, DO Allergy & Immunology  [facility] office: [place] office: [phone]

## 2020-05-14 NOTE — Assessment & Plan Note (Signed)
Past history - Bloodwork was positive to dust mites, cat, dog, grass. Borderline positive to mold, cockroach and tree pollen. Interim history - stable with daily zyrtec daily.  Continue zyrtec 10mg  daily.  May use Flonase 1-2 sprays in each nostril once a day as needed for nasal symptoms.   Continue environmental control measures.

## 2020-05-14 NOTE — Patient Instructions (Addendum)
Urticaria  Continue Zyrtec (ceterizine) 10mg  once a day at night.   Try to wean down on the Pepcid (famotidine) 20mg  once a day in the morning to every other day.  If no itching/rash/hives then you may try to stop after 1 month.   Take pictures when it flares.  Avoid the following potential triggers: alcohol, tight clothing, NSAIDs.  Seasonal and perennial allergic rhinoconjunctivitis  Bloodwork was positive to dust mites, cat, dog, grass. Borderline positive to mold, cockroach and tree pollen.  Continue zyrtec 10mg  daily.  May use Flonase 1-2 sprays in each nostril once a day as needed for nasal symptoms.   Continue environmental control measures.  Anaphylaxis due to hymenoptera venom  Avoid insect stings.  For mild symptoms you can take over the counter antihistamines such as Benadryl and monitor symptoms closely. If symptoms worsen or if you have severe symptoms including breathing issues, throat closure, significant swelling, whole body hives, severe diarrhea and vomiting, lightheadedness then inject epinephrine and seek immediate medical care afterwards.  Shortness of breath  May use albuterol rescue inhaler 2 puffs every 4 to 6 hours as needed for shortness of breath, chest tightness, coughing, and wheezing. Monitor frequency of use.   Follow up in 6 months or sooner if needed.   Reducing Pollen Exposure . Pollen seasons: trees (spring), grass (summer) and ragweed/weeds (fall). Marland Kitchen Keep windows closed in your home and car to lower pollen exposure.  Susa Simmonds air conditioning in the bedroom and throughout the house if possible.  . Avoid going out in dry windy days - especially early morning. . Pollen counts are highest between 5 - 10 AM and on dry, hot and windy days.  . Save outside activities for late afternoon or after a heavy rain, when pollen levels are lower.  . Avoid mowing of grass if you have grass pollen allergy. Marland Kitchen Be aware that pollen can also be  transported indoors on people and pets.  . Dry your clothes in an automatic dryer rather than hanging them outside where they might collect pollen.  . Rinse hair and eyes before bedtime. Control of House Dust Mite Allergen . Dust mite allergens are a common trigger of allergy and asthma symptoms. While they can be found throughout the house, these microscopic creatures thrive in warm, humid environments such as bedding, upholstered furniture and carpeting. . Because so much time is spent in the bedroom, it is essential to reduce mite levels there.  . Encase pillows, mattresses, and box springs in special allergen-proof fabric covers or airtight, zippered plastic covers.  . Bedding should be washed weekly in hot water (130 F) and dried in a hot dryer. Allergen-proof covers are available for comforters and pillows that can't be regularly washed.  Wendee Copp the allergy-proof covers every few months. Minimize clutter in the bedroom. Keep pets out of the bedroom.  Marland Kitchen Keep humidity less than 50% by using a dehumidifier or air conditioning. You can buy a humidity measuring device called a hygrometer to monitor this.  . If possible, replace carpets with hardwood, linoleum, or washable area rugs. If that's not possible, vacuum frequently with a vacuum that has a HEPA filter. . Remove all upholstered furniture and non-washable window drapes from the bedroom. . Remove all non-washable stuffed toys from the bedroom.  Wash stuffed toys weekly. Pet Allergen Avoidance: . Contrary to popular opinion, there are no "hypoallergenic" breeds of dogs or cats. That is because people are not allergic to an animal's hair, but  to an allergen found in the animal's saliva, dander (dead skin flakes) or urine. Pet allergy symptoms typically occur within minutes. For some people, symptoms can build up and become most severe 8 to 12 hours after contact with the animal. People with severe allergies can experience reactions in public  places if dander has been transported on the pet owners' clothing. Marland Kitchen Keeping an animal outdoors is only a partial solution, since homes with pets in the yard still have higher concentrations of animal allergens. . Before getting a pet, ask your allergist to determine if you are allergic to animals. If your pet is already considered part of your family, try to minimize contact and keep the pet out of the bedroom and other rooms where you spend a great deal of time. . As with dust mites, vacuum carpets often or replace carpet with a hardwood floor, tile or linoleum. . High-efficiency particulate air (HEPA) cleaners can reduce allergen levels over time. . While dander and saliva are the source of cat and dog allergens, urine is the source of allergens from rabbits, hamsters, mice and [place] pigs; so ask a non-allergic family member to clean the animal's cage. . If you have a pet allergy, talk to your allergist about the potential for allergy immunotherapy (allergy shots). This strategy can often provide long-term relief.

## 2020-05-14 NOTE — Progress Notes (Signed)
No ICM remote transmission received for 05/07/2020 and next ICM transmission scheduled for 06/04/2020.

## 2020-05-14 NOTE — Assessment & Plan Note (Signed)
Uses xopenex 1-2 times per year for shortness of breath with good benefit.  May use levoalbuterol rescue inhaler 2 puffs every 4 to 6 hours as needed for shortness of breath, chest tightness, coughing, and wheezing. Monitor frequency of use.   Get spirometry at next visit.

## 2020-05-14 NOTE — Assessment & Plan Note (Signed)
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 Most recently got stung a few years ago which required epinephrine use. Bloodwork was borderline positive to paper wasp. Interim history - No stings since last visit.   Avoid insect stings.  For mild symptoms you can take over the counter antihistamines such as Benadryl and monitor symptoms closely. If symptoms worsen or if you have severe symptoms including breathing issues, throat closure, significant swelling, whole body hives, severe diarrhea and vomiting, lightheadedness then inject epinephrine and seek immediate medical care afterwards.  Epinephrine refilled.

## 2020-05-14 NOTE — Assessment & Plan Note (Signed)
Past history - Rash/hives for 3-4 years but worse the last 7 months now having flat rash lasting up to 1 day. Describes them as pruritic and occurring on a daily basis. No triggers identified. Tried benadryl, zyrtec and Claritin with no benefit. Used oral steroids with minimal benefit. Interim history - no hives with zyrtec 10mg  QHS and Pepcid 20mg  QAM. Sometimes notices pruritus and hives if misses few doses of medications.  Continue Zyrtec (ceterizine) 10mg  once a day at night.   Try to wean down on the Pepcid (famotidine) 20mg  once a day in the morning to every other day.  If no itching/rash/hives then you may try to stop after 1 month.   Take pictures when it flares.  Avoid the following potential triggers: alcohol, tight clothing, NSAIDs.

## 2020-05-16 ENCOUNTER — Encounter: Payer: Self-pay | Admitting: Internal Medicine

## 2020-05-16 DIAGNOSIS — E118 Type 2 diabetes mellitus with unspecified complications: Secondary | ICD-10-CM

## 2020-05-16 MED ORDER — SITAGLIPTIN PHOSPHATE 25 MG PO TABS: 25.0000 mg | ORAL_TABLET | Freq: Every day | ORAL | 4 refills | Status: DC

## 2020-05-25 ENCOUNTER — Ambulatory Visit (INDEPENDENT_AMBULATORY_CARE_PROVIDER_SITE_OTHER): Payer: Medicaid Other

## 2020-05-25 DIAGNOSIS — I428 Other cardiomyopathies: Secondary | ICD-10-CM

## 2020-05-25 DIAGNOSIS — I5022 Chronic systolic (congestive) heart failure: Secondary | ICD-10-CM

## 2020-05-25 LAB — CUP PACEART REMOTE DEVICE CHECK
Battery Remaining Longevity: 50 mo
Battery Remaining Percentage: 48 %
Battery Voltage: 2.92 V
Brady Statistic RV Percent Paced: 1 %
Date Time Interrogation Session: 20211217004603
HighPow Impedance: 68 Ohm
HighPow Impedance: 68 Ohm
Implantable Lead Implant Date: 20150717
Implantable Lead Location: 753860
Implantable Pulse Generator Implant Date: 20150717
Lead Channel Impedance Value: 310 Ohm
Lead Channel Pacing Threshold Amplitude: 0.75 V
Lead Channel Pacing Threshold Pulse Width: 0.5 ms
Lead Channel Sensing Intrinsic Amplitude: 11.8 mV
Lead Channel Setting Pacing Amplitude: 2.5 V
Lead Channel Setting Pacing Pulse Width: 0.5 ms
Lead Channel Setting Sensing Sensitivity: 0.5 mV
Pulse Gen Serial Number: 7196009

## 2020-06-07 NOTE — Progress Notes (Signed)
Remote ICD transmission.   

## 2020-06-11 NOTE — Progress Notes (Signed)
No ICM remote transmission received for 06/04/2020 and next ICM transmission scheduled for 06/19/2020.

## 2020-06-14 ENCOUNTER — Other Ambulatory Visit: Payer: Self-pay

## 2020-06-14 ENCOUNTER — Other Ambulatory Visit: Payer: Self-pay | Admitting: *Deleted

## 2020-06-14 ENCOUNTER — Telehealth: Payer: Self-pay | Admitting: Internal Medicine

## 2020-06-14 NOTE — Patient Instructions (Signed)
Visit Information  [name]. [name] was given information about Medicaid Managed Care team care coordination services as a part of their [place] Medicaid benefit. [name] verbally consented to engagement with the [facility] Managed Care team.   For questions related to your [facility], please call: [phone] or visit the homepage here: [email or web address]  If you would like to schedule transportation through your [facility], please call the following number at least 2 days in advance of your appointment: [phone]   Patient Care Plan: Diabetes Type 2 (Adult)    Problem Identified: Glycemic Management (Diabetes, Type 2)     Long-Range Goal: Glycemic Management Optimized   Start Date: 06/14/2020  Expected End Date: 08/13/2020  This Visit's Progress: On track  Priority: High  Note:   CARE PLAN ENTRY Medicaid Managed Care (see longtitudinal plan of care for additional care plan information)  Objective:  Lab Results  Component Value Date   HGBA1C 8.7 (A) 04/09/2020 .   Lab Results  Component Value Date   CREATININE 1.51 (H) 10/14/2019   CREATININE 2.75 (H) 10/13/2018   CREATININE 1.66 (H) 06/28/2018 .   Marland Kitchen No results found for: EGFR . Patient reported cbg findings: Checking BS at random times-100's  Current Barriers:  Marland Kitchen Knowledge Deficits related to basic Diabetes pathophysiology and self care/management-[name] is checking his blood sugar randomly, he does not like to prick his finger due to the pain. He reports that his A1C was high because he has not been able to exercise like he was, due [place] the [place]. He plans to increase his exercise when he feels better. He reports eating a healthy diabetic diet.  Case Manager Clinical Goal(s):  Marland Kitchen Over the next 60 days, patient will demonstrate improved adherence to prescribed treatment plan for  diabetes self care/management as evidenced by:  . daily monitoring and recording of CBG . adherence to ADA/ carb modified diet . adherence to prescribed medication regimen . Increase exercise as tolerated, working up to 5-7 days a week for 30 min  Interventions:  . Provided education to patient about basic DM disease process . Reviewed medications with patient and discussed importance of medication adherence . Discussed plans with patient for ongoing care management follow up and provided patient with direct contact information for care management team . Reviewed scheduled/upcoming provider appointments including: 07/10/20 with PCP . Advised patient, providing education and rationale, to check cbg 2 hours after meals  and record, calling provider for findings outside established parameters. . Referral made to pharmacy team for assistance with medication review . Review of patient status, including review of consultants reports, relevant laboratory and other test results, and medications completed.  Patient Self Care Activities:  . Self administers oral medications as prescribed . Attends all scheduled provider appointments . Adheres to prescribed ADA/carb modified . set target A1C . check blood sugar at prescribed times . check blood sugar if I feel it is too high or too low . enter blood sugar readings and medication or insulin into daily log . take the blood sugar log to all doctor visits . take the blood sugar meter to all doctor visits  Initial goal documentation      Please see education materials related to diabetes provided by MyChart link.    Diabetes Mellitus and Nutrition, Adult When you have diabetes (diabetes mellitus), it is very important to have healthy eating habits because your blood sugar (  glucose) levels are greatly affected by what you eat and drink. Eating healthy foods in the appropriate amounts, at about the same times every day, can help you:  Control your  blood glucose.  Lower your risk of heart disease.  Improve your blood pressure.  Reach or maintain a healthy weight. Every person with diabetes is different, and each person has different needs for a meal plan. Your health care provider may recommend that you work with a diet and nutrition specialist (dietitian) to make a meal plan that is best for you. Your meal plan may vary depending on factors such as:  The calories you need.  The medicines you take.  Your weight.  Your blood glucose, blood pressure, and cholesterol levels.  Your activity level.  Other health conditions you have, such as heart or kidney disease. How do carbohydrates affect me? Carbohydrates, also called carbs, affect your blood glucose level more than any other type of food. Eating carbs naturally raises the amount of glucose in your blood. Carb counting is a method for keeping track of how many carbs you eat. Counting carbs is important to keep your blood glucose at a healthy level, especially if you use insulin or take certain oral diabetes medicines. It is important to know how many carbs you can safely have in each meal. This is different for every person. Your dietitian can help you calculate how many carbs you should have at each meal and for each snack. Foods that contain carbs include:  Bread, cereal, rice, pasta, and crackers.  Potatoes and corn.  Peas, beans, and lentils.  Milk and yogurt.  Fruit and juice.  Desserts, such as cakes, cookies, ice cream, and candy. How does alcohol affect me? Alcohol can cause a sudden decrease in blood glucose (hypoglycemia), especially if you use insulin or take certain oral diabetes medicines. Hypoglycemia can be a life-threatening condition. Symptoms of hypoglycemia (sleepiness, dizziness, and confusion) are similar to symptoms of having too much alcohol. If your health care provider says that alcohol is safe for you, follow these guidelines:  Limit alcohol  intake to no more than 1 drink per day for nonpregnant women and 2 drinks per day for men. One drink equals 12 oz of beer, 5 oz of wine, or 1 oz of hard liquor.  Do not drink on an empty stomach.  Keep yourself hydrated with water, diet soda, or unsweetened iced tea.  Keep in mind that regular soda, juice, and other mixers may contain a lot of sugar and must be counted as carbs. What are tips for following this plan?  Reading food labels  Start by checking the serving size on the "Nutrition Facts" label of packaged foods and drinks. The amount of calories, carbs, fats, and other nutrients listed on the label is based on one serving of the item. Many items contain more than one serving per package.  Check the total grams (g) of carbs in one serving. You can calculate the number of servings of carbs in one serving by dividing the total carbs by 15. For example, if a food has 30 g of total carbs, it would be equal to 2 servings of carbs.  Check the number of grams (g) of saturated and trans fats in one serving. Choose foods that have low or no amount of these fats.  Check the number of milligrams (mg) of salt (sodium) in one serving. Most people should limit total sodium intake to less than 2,300 mg per day.  Always check the nutrition information of foods labeled as "low-fat" or "nonfat". These foods may be higher in added sugar or refined carbs and should be avoided.  Talk to your dietitian to identify your daily goals for nutrients listed on the label. Shopping  Avoid buying canned, premade, or processed foods. These foods tend to be high in fat, sodium, and added sugar.  Shop around the outside edge of the grocery store. This includes fresh fruits and vegetables, bulk grains, fresh meats, and fresh dairy. Cooking  Use low-heat cooking methods, such as baking, instead of high-heat cooking methods like deep frying.  Cook using healthy oils, such as olive, canola, or sunflower  oil.  Avoid cooking with butter, cream, or high-fat meats. Meal planning  Eat meals and snacks regularly, preferably at the same times every day. Avoid going long periods of time without eating.  Eat foods high in fiber, such as fresh fruits, vegetables, beans, and whole grains. Talk to your dietitian about how many servings of carbs you can eat at each meal.  Eat 4-6 ounces (oz) of lean protein each day, such as lean meat, chicken, fish, eggs, or tofu. One oz of lean protein is equal to: ? 1 oz of meat, chicken, or fish. ? 1 egg. ?  cup of tofu.  Eat some foods each day that contain healthy fats, such as avocado, nuts, seeds, and fish. Lifestyle  Check your blood glucose regularly.  Exercise regularly as told by your health care provider. This may include: ? 150 minutes of moderate-intensity or vigorous-intensity exercise each week. This could be brisk walking, biking, or water aerobics. ? Stretching and doing strength exercises, such as yoga or weightlifting, at least 2 times a week.  Take medicines as told by your health care provider.  Do not use any products that contain nicotine or tobacco, such as cigarettes and e-cigarettes. If you need help quitting, ask your health care provider.  Work with a Social worker or diabetes educator to identify strategies to manage stress and any emotional and social challenges. Questions to ask a health care provider  Do I need to meet with a diabetes educator?  Do I need to meet with a dietitian?  What number can I call if I have questions?  When are the best times to check my blood glucose? Where to find more information:  American Diabetes Association: diabetes.org  Academy of Nutrition and Dietetics: www.eatright.[employer] of Diabetes and Digestive and Kidney Diseases (NIH): [employer] Summary  A healthy meal plan will help you control your blood glucose and maintain a healthy lifestyle.  Working with a diet  and nutrition specialist (dietitian) can help you make a meal plan that is best for you.  Keep in mind that carbohydrates (carbs) and alcohol have immediate effects on your blood glucose levels. It is important to count carbs and to use alcohol carefully. This information is not intended to replace advice given to you by your health care provider. Make sure you discuss any questions you have with your health care provider. Document Revised: 05/08/2017 Document Reviewed: 06/30/2016 Elsevier Patient Education  2020 [employer].   Patient verbalizes understanding of instructions provided today.   Telephone follow up appointment with Managed Medicaid care management team member scheduled for:07/16/20 @ 1:30pm  Melissa Montane, RN

## 2020-06-14 NOTE — Patient Outreach (Signed)
Medicaid Managed Care   Nurse Care Manager Note  06/14/2020 Name:  [name] MRN:  [id number] DOB:  [birth year]  [name] is an 55 y.o. year old male who is a primary patient of [name], MD.  The [facility] Managed Care Coordination team was consulted for assistance with:    DM  Mr. Scales was given information about Medicaid Managed Care Coordination team services today. [name] agreed to services and verbal consent obtained.  Engaged with patient by telephone for initial visit in response to provider referral for case management and/or care coordination services.   Assessments/Interventions:  Review of past medical history, allergies, medications, health status, including review of consultants reports, laboratory and other test data, was performed as part of comprehensive evaluation and provision of chronic care management services.  SDOH (Social Determinants of Health) assessments and interventions performed:   Care Plan  Allergies  Allergen Reactions  . Bee Venom Anaphylaxis    Medications Reviewed Today    Reviewed by Melissa Montane, RN (Registered Nurse) on 06/14/20 at 1514  Med List Status: <None>  Medication Order Taking? Sig Documenting Provider Last Dose Status Informant  ACCU-CHEK FASTCLIX LANCETS Miller [id number] Yes Use to check blood sugar 3 times daily. [name], MD Taking Active   aspirin 81 MG tablet [id number] Yes Take 81 mg by mouth daily. [provider] Taking Active Self  atorvastatin (LIPITOR) 10 MG tablet [id number] Yes Take 1 tablet by mouth once daily [name], MD Taking Active   benzonatate (TESSALON) 200 MG capsule [id number] Yes Take 1 capsule (200 mg total) by mouth 3 (three) times daily as needed for cough. Elsie Stain, MD Taking Active   carvedilol (COREG) 6.25 MG tablet [id number] Yes Take 1 tablet (6.25 mg total) by mouth 2 (two) times daily. Lelon Perla, MD Taking Active   cetirizine  (ZYRTEC) 10 MG tablet [id number] Yes Take 1 tablet (10 mg total) by mouth daily. Garnet Sierras, DO Taking Active   cyclobenzaprine (FLEXERIL) 5 MG tablet [id number] Yes Take 1 tablet (5 mg total) by mouth daily as needed for muscle spasms. [name], MD Taking Active   dapagliflozin propanediol (FARXIGA) 5 MG TABS tablet [id number] Yes Take 1 tablet (5 mg total) by mouth daily before breakfast. [name], MD Taking Active   EPINEPHrine 0.3 mg/0.3 mL IJ SOAJ injection [id number] Yes Use as directed for severe allergic reactions. Garnet Sierras, DO Taking Active   famotidine (PEPCID) 20 MG tablet [id number] Yes Take 1 tablet (20 mg total) by mouth daily. Garnet Sierras, DO Taking Active   fluticasone [facility]) 50 MCG/ACT nasal spray [id number] Yes Use 1-2 sprays in each nostril once a day as needed for nasal congestion. Garnet Sierras, DO Taking Active   furosemide (LASIX) 80 MG tablet [id number] Yes Take 1 tablet (80 mg total) by mouth 2 (two) times daily. Lelon Perla, MD Taking Active   gabapentin (NEURONTIN) 300 MG capsule [id number] Yes Take 1 capsule (300 mg total) by mouth at bedtime. [name], MD Taking Active   glucose blood (ACCU-CHEK GUIDE) test strip [id number] Yes Use as instructed to check blood sugar 3 times daily. [name], MD Taking Active   Lancet Devices [facility]) lancets [id number] Yes 1 each by Other route 3 (three) times daily. Boykin Nearing, MD Taking Active Self  levalbuterol [facility] HFA) 45 MCG/ACT inhaler [id number] Yes Inhale 2 puffs into the lungs every  6 (six) hours as needed for wheezing or shortness of breath. Garnet Sierras, DO Taking Active   mometasone (ELOCON) 0.1 % cream [id number] Yes Apply 1 application topically daily. Marzetta Board, DPM Taking Active   sacubitril-valsartan (ENTRESTO) 24-26 MG [id number] Yes Take 1 tablet by mouth 2 (two) times daily. Lelon Perla, MD Taking Active   sitaGLIPtin (JANUVIA) 25 MG tablet  [id number] Yes Take 1 tablet (25 mg total) by mouth daily. [name], MD Taking Active   tamsulosin [facility]) 0.4 MG CAPS capsule [id number] Yes Take 2 capsules by mouth once daily [name], MD Taking Active           Patient Active Problem List   Diagnosis Date Noted  . CKD (chronic kidney disease) stage 2, GFR 60-89 ml/min 10/24/2019  . Acute non-recurrent frontal sinusitis 06/27/2019  . COVID-19 virus infection 06/27/2019  . Glaucoma suspect 12/22/2018  . Seasonal and perennial allergic rhinoconjunctivitis 10/25/2018  . 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 Urticaria 05/03/2018  . S/P inguinal hernia repair 03/01/2018  . Unilateral inguinal hernia without obstruction or gangrene 07/24/2017  . 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  . Hypersomnia 02/28/2016  . Abnormal PFT 02/24/2016  . Shortness of breath 01/28/2016  . Diabetic polyneuropathy associated with type 2 diabetes mellitus ([place]) 12/18/2015  . Lumbar back pain 12/18/2015  . Controlled type 2 diabetes mellitus with complication, with long-term current use of insulin ([place]) 09/17/2015  . 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 Lesion of right median nerve at forearm 04/16/2015  . ICD (implantable cardioverter-defibrillator) in place 03/28/2014  . Chronic systolic congestive heart failure ([place]) 03/28/2014  . Nonischemic cardiomyopathy ([place]) 11/09/2013  . Syncope 10/20/2013  . Chronic chest pain 07/28/2013  . RENAL CALCULUS 05/13/2009  . Obesity 05/08/2009  . Former heavy cigarette smoker (20-39 per day) 05/08/2009  . HOARSENESS 05/08/2009  . ANXIETY 02/28/2008  . ASTHMATIC BRONCHITIS, ACUTE 02/28/2008  . GERD 02/28/2008  . IRRITABLE BOWEL SYNDROME 02/28/2008    Conditions to be addressed/monitored per PCP order:  DMII  Patient Care Plan: Diabetes Type 2 (Adult)    Problem Identified: Glycemic Management (Diabetes, Type 2)     Long-Range Goal: Glycemic  Management Optimized   Start Date: 06/14/2020  Expected End Date: 08/13/2020  This Visit's Progress: On track  Priority: High  Note:   CARE PLAN ENTRY Medicaid Managed Care (see longtitudinal plan of care for additional care plan information)  Objective:  Lab Results  Component Value Date   HGBA1C 8.7 (A) 04/09/2020 .   Lab Results  Component Value Date   CREATININE 1.51 (H) 10/14/2019   CREATININE 2.75 (H) 10/13/2018   CREATININE 1.66 (H) 06/28/2018 .   Marland Kitchen No results found for: EGFR . Patient reported cbg findings: Checking BS at random times-100's  Current Barriers:  Marland Kitchen Knowledge Deficits related to basic Diabetes pathophysiology and self care/management-Mr Custard is checking his blood sugar randomly, he does not like to prick his finger due to the pain. He reports that his A1C was high because he has not been able to exercise like he was, due [place]. He plans to increase his exercise when he feels better. He reports eating a healthy diabetic diet.  Case Manager Clinical Goal(s):  Marland Kitchen Over the next 60 days, patient will demonstrate improved adherence to prescribed treatment plan for diabetes self care/management as evidenced by:  . daily monitoring and recording of CBG .  adherence to ADA/ carb modified diet . adherence to prescribed medication regimen . Increase exercise as tolerated, working up to 5-7 days a week for 30 min  Interventions:  . Provided education to patient about basic DM disease process . Reviewed medications with patient and discussed importance of medication adherence . Discussed plans with patient for ongoing care management follow up and provided patient with direct contact information for care management team . Reviewed scheduled/upcoming provider appointments including: 07/10/20 with PCP . Advised patient, providing education and rationale, to check cbg 2 hours after meals  and record, calling provider for findings outside established  parameters. . Referral made to pharmacy team for assistance with medication review . Review of patient status, including review of consultants reports, relevant laboratory and other test results, and medications completed.  Patient Self Care Activities:  . Self administers oral medications as prescribed . Attends all scheduled provider appointments . Adheres to prescribed ADA/carb modified . set target A1C . check blood sugar at prescribed times . check blood sugar if I feel it is too high or too low . enter blood sugar readings and medication or insulin into daily log . take the blood sugar log to all doctor visits . take the blood sugar meter to all doctor visits  Initial goal documentation      Follow Up:  Patient agrees to Care Plan and Follow-up.  Plan: The Managed Medicaid care management team will reach out to the patient again over the next 30 days.  Date/time of next scheduled RN care management/care coordination outreach: 07/16/20 @ 1:30pm  Lurena Joiner RN, [place] RN Care Coordinator

## 2020-06-14 NOTE — Telephone Encounter (Signed)
-----   Message from Melissa Montane, RN sent at 06/14/2020  3:50 PM EST ----- Sending you a patient for medication review >77meds. Thank you

## 2020-06-14 NOTE — Telephone Encounter (Signed)
I reached out to [name] to get him scheduled for a phone appt with the [facility] Pharmacist. I left my name and number for him to return my call.

## 2020-06-19 ENCOUNTER — Ambulatory Visit (INDEPENDENT_AMBULATORY_CARE_PROVIDER_SITE_OTHER): Payer: Medicaid Other

## 2020-06-19 DIAGNOSIS — I5022 Chronic systolic (congestive) heart failure: Secondary | ICD-10-CM | POA: Diagnosis not present

## 2020-06-19 DIAGNOSIS — Z95810 Presence of automatic (implantable) cardiac defibrillator: Secondary | ICD-10-CM

## 2020-06-22 NOTE — Progress Notes (Signed)
Spoke with patient and transmission reviewed.  He has Dr Jacalyn Lefevre approval to self adjust Furosemide and will take extra one for 1-2 days.  He will send manual report on 06/25/2020 to recheck fluid levels.   Updated Corvue for 06/22/2020 suggesting fluid accumulation continues and no longer trending back to baseline.

## 2020-06-22 NOTE — Progress Notes (Signed)
EPIC Encounter for ICM Monitoring  Patient Name: [name] is a 55 y.o. male Date: 06/22/2020 Primary Care Physican: [name], MD Primary Cardiologist:[place] Electrophysiologist: Allred 9/21/2021Weight228lbs  Spoke with patient and reports feeling well at this time.  Denies fluid symptoms.  Advised 06/19/2020 report showing fluid since 06/15/2020.  Requested he send updated report today, 06/22/2020 for review.  Corvue thoracic impedance suggesting possible fluid accumulation since 06/15/2020 but trending back to baseline.  Prescribed dosage:Furosemide 80 mgtake1tablet (80 mg total)twice a day.   Labs: 06/28/2018 Creatinine 1.66, BUN 18, Potassium 4.0, Sodium 142, GFR 46-54 Complete set of lab results in result review.  Recommendations:Patient will send updated report for review to determine if impedance has improved.   Follow-up plan: ICM clinic phone appointment on1/17/2022 to recheck fluid levels. 91 day device clinic remote transmission 08/24/2020.   EP/Cardiology Office Visits:10/22/2020 with [place].   Copy of ICM check sent to Dr.Allred and Dr Stanford Breed for review.  3 month ICM trend: 06/19/2020.    1 Year ICM trend:       [name], RN 06/22/2020 11:05 AM

## 2020-06-26 NOTE — Progress Notes (Signed)
No ICM remote transmission received for 06/25/2020 and next ICM transmission scheduled for 07/30/2020.

## 2020-07-10 ENCOUNTER — Other Ambulatory Visit: Payer: Self-pay

## 2020-07-10 ENCOUNTER — Ambulatory Visit: Payer: Medicaid Other | Attending: Internal Medicine | Admitting: Internal Medicine

## 2020-07-13 ENCOUNTER — Encounter: Payer: Self-pay | Admitting: Internal Medicine

## 2020-07-13 ENCOUNTER — Other Ambulatory Visit: Payer: Self-pay | Admitting: Internal Medicine

## 2020-07-13 DIAGNOSIS — R3915 Urgency of urination: Secondary | ICD-10-CM

## 2020-07-16 ENCOUNTER — Other Ambulatory Visit: Payer: Self-pay | Admitting: *Deleted

## 2020-07-16 ENCOUNTER — Other Ambulatory Visit: Payer: Self-pay

## 2020-07-16 NOTE — Patient Outreach (Signed)
Care Coordination  07/16/2020  ESAI THAUT [birth year] [id number]  A successful outreach with Mr Marotta today. However, Mr. Wyka was busy and requested to reschedule to later this week. A new telephone appointment was made for 07/18/20 @ 3:30p. Mr Parello agreed to this date and time. RNCM will reach out again on the set appointment.  Lurena Joiner RN, BSN [place]  [employer] manager

## 2020-07-17 ENCOUNTER — Other Ambulatory Visit: Payer: Self-pay

## 2020-07-17 ENCOUNTER — Ambulatory Visit: Payer: Medicaid Other | Attending: Internal Medicine

## 2020-07-17 ENCOUNTER — Other Ambulatory Visit: Payer: Self-pay | Admitting: Internal Medicine

## 2020-07-17 DIAGNOSIS — R3915 Urgency of urination: Secondary | ICD-10-CM | POA: Diagnosis not present

## 2020-07-17 DIAGNOSIS — N182 Chronic kidney disease, stage 2 (mild): Secondary | ICD-10-CM

## 2020-07-18 ENCOUNTER — Encounter: Payer: Self-pay | Admitting: Internal Medicine

## 2020-07-18 ENCOUNTER — Encounter: Payer: Self-pay | Admitting: *Deleted

## 2020-07-18 ENCOUNTER — Other Ambulatory Visit: Payer: Self-pay | Admitting: *Deleted

## 2020-07-18 ENCOUNTER — Other Ambulatory Visit: Payer: Self-pay | Admitting: Internal Medicine

## 2020-07-18 DIAGNOSIS — E669 Obesity, unspecified: Secondary | ICD-10-CM

## 2020-07-18 DIAGNOSIS — E1169 Type 2 diabetes mellitus with other specified complication: Secondary | ICD-10-CM

## 2020-07-18 LAB — URINALYSIS, ROUTINE W REFLEX MICROSCOPIC
Bilirubin, UA: NEGATIVE
Ketones, UA: NEGATIVE
Leukocytes,UA: NEGATIVE
Nitrite, UA: NEGATIVE
Protein,UA: NEGATIVE
RBC, UA: NEGATIVE
Specific Gravity, UA: 1.016 (ref 1.005–1.030)
Urobilinogen, Ur: 1 mg/dL (ref 0.2–1.0)
pH, UA: 6 (ref 5.0–7.5)

## 2020-07-18 LAB — BASIC METABOLIC PANEL
BUN/Creatinine Ratio: 8 — ABNORMAL LOW (ref 9–20)
BUN: 13 mg/dL (ref 6–24)
CO2: 25 mmol/L (ref 20–29)
Calcium: 9.1 mg/dL (ref 8.7–10.2)
Chloride: 101 mmol/L (ref 96–106)
Creatinine, Ser: 1.66 mg/dL — ABNORMAL HIGH (ref 0.76–1.27)
GFR calc Af Amer: 53 mL/min/{1.73_m2} — ABNORMAL LOW (ref >59)
GFR calc non Af Amer: 46 mL/min/{1.73_m2} — ABNORMAL LOW (ref >59)
Glucose: 253 mg/dL — ABNORMAL HIGH (ref 65–99)
Potassium: 3.7 mmol/L (ref 3.5–5.2)
Sodium: 142 mmol/L (ref 134–144)

## 2020-07-18 NOTE — Patient Instructions (Signed)
Visit Information  [name]  - as a part of your Medicaid benefit, you are eligible for care management and care coordination services at no cost or copay. I was unable to reach you by phone today but would be happy to help you with your health related needs. Please feel free to call me @ [phone].   A member of the Managed Medicaid care management team will reach out to you again over the next 7-14 days.   Lurena Joiner RN, BSN [place]  [employer] manager

## 2020-07-18 NOTE — Patient Outreach (Signed)
Care Coordination  07/18/2020  [name] Jan 12, 1966 [id number]    Medicaid Managed Care   Unsuccessful Outreach Note  07/18/2020 Name: [name] MRN: [id number] DOB: 21-Nov-1965  Referred by: [name], MD Reason for referral : High Risk Managed Medicaid (Unsuccessful RNCM follow up outreach)   An unsuccessful telephone outreach was attempted today. The patient was referred to the case management team for assistance with care management and care coordination.   Follow Up Plan: The patient has been provided with contact information for the care management team and has been advised to call with any health related questions or concerns.  The care management team will reach out to the patient again over the next 7-14 days.   Lurena Joiner RN, BSN [place]  [employer] manager

## 2020-07-23 ENCOUNTER — Other Ambulatory Visit: Payer: Self-pay

## 2020-07-23 ENCOUNTER — Ambulatory Visit: Payer: Medicaid Other | Attending: Internal Medicine

## 2020-07-23 DIAGNOSIS — E1169 Type 2 diabetes mellitus with other specified complication: Secondary | ICD-10-CM

## 2020-07-23 DIAGNOSIS — E669 Obesity, unspecified: Secondary | ICD-10-CM

## 2020-07-24 LAB — HEMOGLOBIN A1C
Est. average glucose Bld gHb Est-mCnc: 189 mg/dL
Hgb A1c MFr Bld: 8.2 % — ABNORMAL HIGH (ref 4.8–5.6)

## 2020-07-30 ENCOUNTER — Ambulatory Visit (INDEPENDENT_AMBULATORY_CARE_PROVIDER_SITE_OTHER): Payer: Medicaid Other

## 2020-07-30 DIAGNOSIS — Z95810 Presence of automatic (implantable) cardiac defibrillator: Secondary | ICD-10-CM

## 2020-07-30 DIAGNOSIS — I5022 Chronic systolic (congestive) heart failure: Secondary | ICD-10-CM

## 2020-07-31 ENCOUNTER — Other Ambulatory Visit: Payer: Self-pay

## 2020-07-31 NOTE — Patient Instructions (Signed)
Visit Information  Mr. Cargile was given information about Medicaid Managed Care team care coordination services as a part of their [place] Medicaid benefit. [name] verbally consented to engagement with the [facility] Managed Care team.   Thank you for speaking with me today Mr. Klecka, again you can contact [facility] [phone] to schedule an appointment for a root canal. They will make sure Medicaid will approve it before completing the procedure. For your utility assistance you can go to the Department of Social Services or they can be reached at [phone].  If you have any questions, I can be reached at [phone]. For questions related to your [facility], please call: [phone] or visit the homepage here: [email or web address]  If you would like to schedule transportation through your [facility], please call the following number at least 2 days in advance of your appointment: [phone].   Mr. Kissoon - following are the goals we discussed in your visit today:  Goals Addressed   None     Social Worker will follow up in 14 days.   Ethelda Chick  Following is a copy of your plan of care:  Patient Care Plan: Diabetes Type 2 (Adult)    Problem Identified: Glycemic Management (Diabetes, Type 2)     Long-Range Goal: Glycemic Management Optimized   Start Date: 06/14/2020  Expected End Date: 08/13/2020  This Visit's Progress: On track  Priority: High  Note:   CARE PLAN ENTRY Medicaid Managed Care (see longtitudinal plan of care for additional care plan information)  Objective:  Lab Results  Component Value Date   HGBA1C 8.7 (A) 04/09/2020 .   Lab Results  Component Value Date   CREATININE 1.51 (H) 10/14/2019   CREATININE 2.75 (H) 10/13/2018   CREATININE 1.66 (H) 06/28/2018 .   Marland Kitchen No results found for: EGFR . Patient reported cbg  findings: Checking BS at random times-100's  Current Barriers:  Marland Kitchen Knowledge Deficits related to basic Diabetes pathophysiology and self care/management-Mr Blaylock is checking his blood sugar randomly, he does not like to prick his finger due to the pain. He reports that his A1C was high because he has not been able to exercise like he was, due to a [place] the park. He plans to increase his exercise when he feels better. He reports eating a healthy diabetic diet.  Case Manager Clinical Goal(s):  Marland Kitchen Over the next 60 days, patient will demonstrate improved adherence to prescribed treatment plan for diabetes self care/management as evidenced by:  . daily monitoring and recording of CBG . adherence to ADA/ carb modified diet . adherence to prescribed medication regimen . Increase exercise as tolerated, working up to 5-7 days a week for 30 min  Interventions:  . Provided education to patient about basic DM disease process . Reviewed medications with patient and discussed importance of medication adherence . Discussed plans with patient for ongoing care management follow up and provided patient with direct contact information for care management team . Reviewed scheduled/upcoming provider appointments including: 07/10/20 with PCP . Advised patient, providing education and rationale, to check cbg 2 hours after meals  and record, calling provider for findings outside established parameters. . Referral made to pharmacy team for assistance with medication review . Review of patient status, including review of consultants reports, relevant laboratory and other test results, and medications completed.  Patient Self Care Activities:  . Self administers oral medications as prescribed .  Attends all scheduled provider appointments . Adheres to prescribed ADA/carb modified . set target A1C . check blood sugar at prescribed times . check blood sugar if I feel it is too high or too low . enter blood  sugar readings and medication or insulin into daily log . take the blood sugar log to all doctor visits . take the blood sugar meter to all doctor visits  Initial goal documentation

## 2020-07-31 NOTE — Patient Outreach (Signed)
Medicaid Managed Care Social Work Note  07/31/2020 Name:  [name] MRN:  [id number] DOB:  September 12, 1965  [name] is an 55 y.o. year old male who is a primary patient of [name] Pier, MD.  The Medicaid Managed Care Coordination team was consulted for assistance with:  Community Resources   [name] was given information about Medicaid Managed CareCoordination services today. [name] agreed to services and verbal consent obtained.  Engaged with patient  for by telephone forinitial visit in response to referral for case management and/or care coordination services.   Assessments/Interventions:  Review of past medical history, allergies, medications, health status, including review of consultants reports, laboratory and other test data, was performed as part of comprehensive evaluation and provision of chronic care management services.  SDOH: (Social Determinant of Health) assessments and interventions performed:   Patient stated he is in need of a root canal. BSW provided patient with the phone number for [facility] [phone]. Patient stated that he may need assistance with his utility bill, BSW informed patient that he could go to the Department of Social Services and apply for their LIEAP program.  Advanced Directives Status:  Not addressed in this encounter.  Care Plan                 Allergies  Allergen Reactions  . Bee Venom Anaphylaxis    Medications Reviewed Today    Reviewed by [name] Montane, RN (Registered Nurse) on 06/14/20 at 1514  Med List Status: <None>  Medication Order Taking? Sig Documenting Provider Last Dose Status Informant  ACCU-CHEK FASTCLIX LANCETS [place] [id number] Yes Use to check blood sugar 3 times daily. [name] Pier, MD Taking Active   aspirin 81 MG tablet [id number] Yes Take 81 mg by mouth daily. [provider] Taking Active Self  atorvastatin (LIPITOR) 10 MG tablet [id number] Yes Take 1 tablet by mouth once daily  [name] Pier, MD Taking Active   benzonatate (TESSALON) 200 MG capsule [id number] Yes Take 1 capsule (200 mg total) by mouth 3 (three) times daily as needed for cough. Elsie Stain, MD Taking Active   carvedilol (COREG) 6.25 MG tablet [id number] Yes Take 1 tablet (6.25 mg total) by mouth 2 (two) times daily. Lelon Perla, MD Taking Active   cetirizine (ZYRTEC) 10 MG tablet [id number] Yes Take 1 tablet (10 mg total) by mouth daily. Garnet Sierras, DO Taking Active   cyclobenzaprine (FLEXERIL) 5 MG tablet [id number] Yes Take 1 tablet (5 mg total) by mouth daily as needed for muscle spasms. [name] Pier, MD Taking Active   dapagliflozin propanediol (FARXIGA) 5 MG TABS tablet [id number] Yes Take 1 tablet (5 mg total) by mouth daily before breakfast. [name] Pier, MD Taking Active   EPINEPHrine 0.3 mg/0.3 mL IJ SOAJ injection [id number] Yes Use as directed for severe allergic reactions. Garnet Sierras, DO Taking Active   famotidine (PEPCID) 20 MG tablet [id number] Yes Take 1 tablet (20 mg total) by mouth daily. Garnet Sierras, DO Taking Active   fluticasone [facility]) 50 MCG/ACT nasal spray [id number] Yes Use 1-2 sprays in each nostril once a day as needed for nasal congestion. Garnet Sierras, DO Taking Active   furosemide (LASIX) 80 MG tablet [id number] Yes Take 1 tablet (80 mg total) by mouth 2 (two) times daily. Lelon Perla, MD Taking Active   gabapentin (NEURONTIN) 300 MG capsule [id number] Yes Take 1 capsule (300 mg total) by mouth at bedtime.  [name] Pier, MD Taking Active   glucose blood (ACCU-CHEK GUIDE) test strip [id number] Yes Use as instructed to check blood sugar 3 times daily. [name] Pier, MD Taking Active   Lancet Devices [facility]) lancets [id number] Yes 1 each by Other route 3 (three) times daily. Boykin Nearing, MD Taking Active Self  levalbuterol [facility] HFA) 45 MCG/ACT inhaler [id number] Yes Inhale 2 puffs into the lungs every 6 (six) hours as needed for  wheezing or shortness of breath. Garnet Sierras, DO Taking Active   mometasone (ELOCON) 0.1 % cream [id number] Yes Apply 1 application topically daily. Marzetta Board, DPM Taking Active   sacubitril-valsartan (ENTRESTO) 24-26 MG [id number] Yes Take 1 tablet by mouth 2 (two) times daily. Lelon Perla, MD Taking Active   sitaGLIPtin (JANUVIA) 25 MG tablet [id number] Yes Take 1 tablet (25 mg total) by mouth daily. [name] Pier, MD Taking Active   tamsulosin [facility]) 0.4 MG CAPS capsule [id number] Yes Take 2 capsules by mouth once daily [name] Pier, MD Taking Active           Patient Active Problem List   Diagnosis Date Noted  . CKD (chronic kidney disease) stage 2, GFR 60-89 ml/min 10/24/2019  . Acute non-recurrent frontal sinusitis 06/27/2019  . COVID-19 virus infection 06/27/2019  . Glaucoma suspect 12/22/2018  . Seasonal and perennial allergic rhinoconjunctivitis 10/25/2018  . 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 Urticaria 05/03/2018  . S/P inguinal hernia repair 03/01/2018  . Unilateral inguinal hernia without obstruction or gangrene 07/24/2017  . 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  . Hypersomnia 02/28/2016  . Abnormal PFT 02/24/2016  . Shortness of breath 01/28/2016  . Diabetic polyneuropathy associated with type 2 diabetes mellitus ([place]) 12/18/2015  . Lumbar back pain 12/18/2015  . Controlled type 2 diabetes mellitus with complication, with long-term current use of insulin ([place]) 09/17/2015  . 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 Lesion of right median nerve at forearm 04/16/2015  . ICD (implantable cardioverter-defibrillator) in place 03/28/2014  . Chronic systolic congestive heart failure ([place]) 03/28/2014  . Nonischemic cardiomyopathy ([place]) 11/09/2013  . Syncope 10/20/2013  . Chronic chest pain 07/28/2013  . RENAL CALCULUS 05/13/2009  . Obesity 05/08/2009  . Former heavy cigarette smoker (20-39 per day) 05/08/2009   . HOARSENESS 05/08/2009  . ANXIETY 02/28/2008  . ASTHMATIC BRONCHITIS, ACUTE 02/28/2008  . GERD 02/28/2008  . IRRITABLE BOWEL SYNDROME 02/28/2008    Conditions to be addressed/monitored per PCP order:  root canal  There are no care plans that you recently modified to display for this patient.   Follow up:  Patient agrees to Care Plan and Follow-up.  Plan: The Managed Medicaid care management team will reach out to the patient again over the next 14 days.  Date/time of next scheduled Social Work care management/care coordination outreach:  08/20/20  Mickel Fuchs, [place], [place]  High Risk Managed Medicaid Team

## 2020-08-02 ENCOUNTER — Telehealth: Payer: Self-pay

## 2020-08-02 NOTE — Telephone Encounter (Signed)
Left a message for the patient to send manual transmission as soon as possible.

## 2020-08-03 ENCOUNTER — Ambulatory Visit: Payer: Medicaid Other | Attending: Internal Medicine | Admitting: Internal Medicine

## 2020-08-03 ENCOUNTER — Telehealth: Payer: Self-pay | Admitting: Internal Medicine

## 2020-08-03 ENCOUNTER — Other Ambulatory Visit: Payer: Self-pay

## 2020-08-03 DIAGNOSIS — E1169 Type 2 diabetes mellitus with other specified complication: Secondary | ICD-10-CM | POA: Diagnosis not present

## 2020-08-03 DIAGNOSIS — R3589 Other polyuria: Secondary | ICD-10-CM

## 2020-08-03 DIAGNOSIS — L309 Dermatitis, unspecified: Secondary | ICD-10-CM

## 2020-08-03 DIAGNOSIS — M62838 Other muscle spasm: Secondary | ICD-10-CM

## 2020-08-03 DIAGNOSIS — E669 Obesity, unspecified: Secondary | ICD-10-CM

## 2020-08-03 MED ORDER — TRIAMCINOLONE ACETONIDE 0.1 % EX CREA: 1.0000 "application " | TOPICAL_CREAM | Freq: Two times a day (BID) | CUTANEOUS | 0 refills | Status: DC

## 2020-08-03 MED ORDER — CYCLOBENZAPRINE HCL 5 MG PO TABS: 5.0000 mg | ORAL_TABLET | Freq: Every day | ORAL | 1 refills | Status: DC | PRN

## 2020-08-03 NOTE — Telephone Encounter (Signed)
Spoke with provider and provider will resend rx

## 2020-08-03 NOTE — Progress Notes (Signed)
Virtual Visit via Telephone Note  I connected with [name] on 08/03/20 at 1:56 p.m by telephone and verified that I am speaking with the correct person using two identifiers.  Location: Patient: home Provider: office The patient, my CMA Ms. Lebron Quam and myself participated in this encounter. I discussed the limitations, risks, security and privacy concerns of performing an evaluation and management service by telephone and the availability of in person appointments. I also discussed with the patient that there may be a patient responsible charge related to this service. The patient expressed understanding and agreed to proceed.   History of Present Illness: Patient with history ofNICM, systolic CHFs/pICD placement,DMtype 2, HL,CKD stage3,obesity, BPH,GERDand chronicLBPsecondary to spondylosis and spinal stenosis, COVID-19 infection.  Last seen 11/21.  Today's visit is a chronic disease management.  DM: Recent A1c was 8.2. A1c in the fall of last year was 8.7.  Started walking daily and eating habits improved since we last communicated via Mychart.  Not checking BS regularly.  2 days ago it was 155 He restarted the [place]. He continues to take the [place]. He was concerned that Wilder Glade was causing polyuria above an beyond what he usually experiences when he takes furosemide. We had checked his UA and it was negative for UTI. He has BPH and is on 0.8 mg of Flomax.  Has a patchy rash on abdomen that looks like eczema. Present x 3 wks.  About size of the palm of his hand.  It does itch. No initiating factors.  Requests refill on Flexeril which he takes for muscle spasms. Outpatient Encounter Medications as of 08/03/2020  Medication Sig  . ACCU-CHEK FASTCLIX LANCETS MISC Use to check blood sugar 3 times daily.  Marland Kitchen aspirin 81 MG tablet Take 81 mg by mouth daily.  Marland Kitchen atorvastatin (LIPITOR) 10 MG tablet Take 1 tablet by mouth once daily  . benzonatate (TESSALON) 200 MG capsule  Take 1 capsule (200 mg total) by mouth 3 (three) times daily as needed for cough. (Patient not taking: Reported on 08/03/2020)  . carvedilol (COREG) 6.25 MG tablet Take 1 tablet (6.25 mg total) by mouth 2 (two) times daily.  . cetirizine (ZYRTEC) 10 MG tablet Take 1 tablet (10 mg total) by mouth daily.  . cyclobenzaprine (FLEXERIL) 5 MG tablet Take 1 tablet (5 mg total) by mouth daily as needed for muscle spasms.  . dapagliflozin propanediol (FARXIGA) 5 MG TABS tablet Take 1 tablet (5 mg total) by mouth daily before breakfast.  . EPINEPHrine 0.3 mg/0.3 mL IJ SOAJ injection Use as directed for severe allergic reactions.  . famotidine (PEPCID) 20 MG tablet Take 1 tablet (20 mg total) by mouth daily.  . fluticasone (FLONASE) 50 MCG/ACT nasal spray Use 1-2 sprays in each nostril once a day as needed for nasal congestion.  . furosemide (LASIX) 80 MG tablet Take 1 tablet (80 mg total) by mouth 2 (two) times daily.  Marland Kitchen gabapentin (NEURONTIN) 300 MG capsule Take 1 capsule (300 mg total) by mouth at bedtime.  Marland Kitchen glucose blood (ACCU-CHEK GUIDE) test strip Use as instructed to check blood sugar 3 times daily.  Elmore Guise Devices (ACCU-CHEK SOFTCLIX) lancets 1 each by Other route 3 (three) times daily.  Marland Kitchen levalbuterol (XOPENEX HFA) 45 MCG/ACT inhaler Inhale 2 puffs into the lungs every 6 (six) hours as needed for wheezing or shortness of breath.  . mometasone (ELOCON) 0.1 % cream Apply 1 application topically daily.  . sacubitril-valsartan (ENTRESTO) 24-26 MG Take 1 tablet by mouth 2 (  two) times daily.  . sitaGLIPtin (JANUVIA) 25 MG tablet Take 1 tablet (25 mg total) by mouth daily.  . tamsulosin (FLOMAX) 0.4 MG CAPS capsule Take 2 capsules by mouth once daily   No facility-administered encounter medications on file as of 08/03/2020.      Observations/Objective: No direct observation done as this was a telephone encounter.  Assessment and Plan: 1. Type 2 diabetes mellitus with obesity [facility]) Patient reports  that he is back on track exercising daily and eating healthier. He wants to hold off on making any changes in his medications. He feels that he is A1c will be much better on next visit. He has also set a goal to lose about 20 pounds by her next visit. He will continue the [place].  2. Dermatitis - triamcinolone (KENALOG) 0.1 %; Apply 1 application topically 2 (two) times daily.  Dispense: 30 g; Refill: 0  3. Muscle spasm - cyclobenzaprine (FLEXERIL) 5 MG tablet; Take 1 tablet (5 mg total) by mouth daily as needed for muscle spasms.  Dispense: 90 tablet; Refill: 1  4. Polyuria We discussed referring him to urology. Patient declines at this time. He states that the polyuria has decreased to some since he last contacted me.   Follow Up Instructions:    I discussed the assessment and treatment plan with the patient. The patient was provided an opportunity to ask questions and all were answered. The patient agreed with the plan and demonstrated an understanding of the instructions.   The patient was advised to call back or seek an in-person evaluation if the symptoms worsen or if the condition fails to improve as anticipated.  I provided 10 minutes of non-face-to-face time during this encounter.   Karle Plumber, MD

## 2020-08-03 NOTE — Telephone Encounter (Signed)
Copied from [place] [id number] [phone]. Topic: General - Other >> Aug 03, 2020  3:00 PM Leward Quan A wrote: Reason for CRM: Minna Merritts with [facility] [phone] called in they need to know how to dispense this medication triamcinolone (KENALOG) 0.1 % ( lotion, cream or ointment) please call pharmacy or send new Rx

## 2020-08-06 ENCOUNTER — Telehealth: Payer: Self-pay | Admitting: Internal Medicine

## 2020-08-06 MED ORDER — TRIAMCINOLONE ACETONIDE 0.1 % EX CREA: TOPICAL_CREAM | CUTANEOUS | 0 refills | Status: DC

## 2020-08-06 NOTE — Telephone Encounter (Signed)
Patient called to verify medication for triamcinolone (KENALOG) 0.1 %.  They would like to know if the script is for the cream, ointment or lotion.  Stated that the prescription did not specify.  Please call to discuss at [phone]

## 2020-08-06 NOTE — Telephone Encounter (Signed)
Dr. Wynetta Emery could you resend rx

## 2020-08-07 NOTE — Progress Notes (Signed)
EPIC Encounter for ICM Monitoring  Patient Name: [name] is a 55 y.o. male Date: 08/07/2020 Primary Care Physican: [name], MD Primary Cardiologist:[place] Electrophysiologist: Allred 12/6/2021Office [id number]  Transmission reviewed.  [place] thoracic impedance normal.  Prescribed dosage:Furosemide 80 mgtake1tablet (80 mg total)twice a day.   Labs: 07/17/2020 Creatinine 1.66, BUN 13, Potassium 3.7, Sodium 142, GFR 46-53 Complete set of lab results in result review.  Recommendations:  No changes.  Follow-up plan: ICM clinic phone appointment on4/09/2020. 91 day device clinic remote transmission 08/24/2020.   EP/Cardiology Office Visits:10/22/2020 with [name].   Copy of ICM check sent to Dr.Allred  3 month ICM trend: 08/03/2020.    1 Year ICM trend:       [name] Billings, RN 08/07/2020 2:53 PM

## 2020-08-10 ENCOUNTER — Other Ambulatory Visit: Payer: Self-pay | Admitting: *Deleted

## 2020-08-10 NOTE — Patient Outreach (Signed)
Care Coordination  08/10/2020  [name] 12/03/1965 [id number]   [name] expressed to MM Care Guide J. Jon Gills that he is unable to continue working with MM RNCM due to caring for his sick parents. RNCM will reach out again in June to follow up and see if [name] has any new needs or barriers related to managing his care.

## 2020-08-20 ENCOUNTER — Other Ambulatory Visit: Payer: Self-pay

## 2020-08-20 NOTE — Patient Instructions (Signed)
Visit Information  Mr. Ehle was given information about Medicaid Managed Care team care coordination services as a part of their [place] Medicaid benefit. DEONTEZ KLINKE verbally consented to engagement with the [facility] Managed Care team.   For questions related to your [facility], please call: [phone] or visit the homepage here: [email or web address]  If you would like to schedule transportation through your [facility], please call the following number at least 2 days in advance of your appointment: [phone].   Mr. Demir - following are the goals we discussed in your visit today:  Goals Addressed   None     Social Worker will follow up in 30 days.   Ethelda Chick  Following is a copy of your plan of care:  Patient Care Plan: Diabetes Type 2 (Adult)    Problem Identified: Glycemic Management (Diabetes, Type 2)     Long-Range Goal: Glycemic Management Optimized   Start Date: 06/14/2020  Expected End Date: 11/12/2020  Recent Progress: On track  Priority: High  Note:   CARE PLAN ENTRY Medicaid Managed Care (see longtitudinal plan of care for additional care plan information)  Objective:  Lab Results  Component Value Date   HGBA1C 8.7 (A) 04/09/2020 .   Lab Results  Component Value Date   CREATININE 1.51 (H) 10/14/2019   CREATININE 2.75 (H) 10/13/2018   CREATININE 1.66 (H) 06/28/2018 .   Marland Kitchen No results found for: EGFR . Patient reported cbg findings: Checking BS at random times-100's  Current Barriers:  Marland Kitchen Knowledge Deficits related to basic Diabetes pathophysiology and self care/management-Mr Havard is checking his blood sugar randomly, he does not like to prick his finger due to the pain. He reports that his A1C was high because he has not been able to exercise like he was, due [place]. He plans to increase his  exercise when he feels better. He reports eating a healthy diabetic diet.  Case Manager Clinical Goal(s):  Marland Kitchen Over the next 60 days, patient will demonstrate improved adherence to prescribed treatment plan for diabetes self care/management as evidenced by:  . daily monitoring and recording of CBG . adherence to ADA/ carb modified diet . adherence to prescribed medication regimen . Increase exercise as tolerated, working up to 5-7 days a week for 30 min  Interventions:  . Provided education to patient about basic DM disease process . Reviewed medications with patient and discussed importance of medication adherence . Discussed plans with patient for ongoing care management follow up and provided patient with direct contact information for care management team . Reviewed scheduled/upcoming provider appointments including: 07/10/20 with PCP . Advised patient, providing education and rationale, to check cbg 2 hours after meals  and record, calling provider for findings outside established parameters. . Referral made to pharmacy team for assistance with medication review . Review of patient status, including review of consultants reports, relevant laboratory and other test results, and medications completed.  Patient Self Care Activities:  . Self administers oral medications as prescribed . Attends all scheduled provider appointments . Adheres to prescribed ADA/carb modified . set target A1C . check blood sugar at prescribed times . check blood sugar if I feel it is too high or too low . enter blood sugar readings and medication or insulin into daily log . take the blood sugar log to all doctor visits . take the blood sugar meter to all doctor visits  Initial goal documentation

## 2020-08-20 NOTE — Patient Outreach (Signed)
Medicaid Managed Care Social Work Note  08/20/2020 Name:  [name] MRN:  [id number] DOB:  02/04/1966  [name] is an 55 y.o. year old male who is a primary patient of [name], MD.  The [facility] Managed Care Coordination team was consulted for assistance with:  dental and utility resources  Mr. Market was given information about Medicaid Managed CareCoordination services today. [name] agreed to services and verbal consent obtained.  Engaged with patient  for by telephone forfollow up visit in response to referral for case management and/or care coordination services.   Assessments/Interventions:  Review of past medical history, allergies, medications, health status, including review of consultants reports, laboratory and other test data, was performed as part of comprehensive evaluation and provision of chronic care management services.  SDOH: (Social Determinant of Health) assessments and interventions performed:  Patient stated he has been feeling sick and has been unable to contact any resources provided. Patient asked about a dental resource and stated he contacted [facility] or another dental office and they did not accept Medicaid. BSW informed patient the only other place that accepted Medicaid and did root canals was [facility] [phone]. BSW contacted them to make sure they still accepted Medicaid. BSW spoke with Juliann Pulse she stated Medicaid would not pay for patients root canal and he would have to pay out of pocket at about 3000.    Advanced Directives Status:  Not addressed in this encounter.  Care Plan                 Allergies  Allergen Reactions  . Bee Venom Anaphylaxis    Medications Reviewed Today    Reviewed by Jackelyn Knife, RMA (Registered Medical Assistant) on 08/03/20 at 1349  Med List Status: <None>  Medication Order Taking? Sig Documenting Provider Last Dose Status Informant  ACCU-CHEK FASTCLIX LANCETS [place]  [id number]  Use to check blood sugar 3 times daily. [name], MD  Active   aspirin 81 MG tablet [id number]  Take 81 mg by mouth daily. [provider]  Active Self  atorvastatin (LIPITOR) 10 MG tablet [id number]  Take 1 tablet by mouth once daily [name], MD  Active   benzonatate (TESSALON) 200 MG capsule [id number]  Take 1 capsule (200 mg total) by mouth 3 (three) times daily as needed for cough. Elsie Stain, MD  Active   carvedilol (COREG) 6.25 MG tablet [id number]  Take 1 tablet (6.25 mg total) by mouth 2 (two) times daily. Lelon Perla, MD  Active   cetirizine (ZYRTEC) 10 MG tablet [id number]  Take 1 tablet (10 mg total) by mouth daily. Garnet Sierras, DO  Active   cyclobenzaprine (FLEXERIL) 5 MG tablet [id number]  Take 1 tablet (5 mg total) by mouth daily as needed for muscle spasms. [name], MD  Active   dapagliflozin propanediol (FARXIGA) 5 MG TABS tablet [id number]  Take 1 tablet (5 mg total) by mouth daily before breakfast. [name], MD  Active   EPINEPHrine 0.3 mg/0.3 mL IJ SOAJ injection [id number]  Use as directed for severe allergic reactions. Garnet Sierras, DO  Active   famotidine (PEPCID) 20 MG tablet [id number]  Take 1 tablet (20 mg total) by mouth daily. Garnet Sierras, DO  Active   fluticasone [facility]) 50 MCG/ACT nasal spray [id number]  Use 1-2 sprays in each nostril once a day as needed for nasal congestion. Garnet Sierras, DO  Active  furosemide (LASIX) 80 MG tablet [id number]  Take 1 tablet (80 mg total) by mouth 2 (two) times daily. Lelon Perla, MD  Active   gabapentin (NEURONTIN) 300 MG capsule [id number]  Take 1 capsule (300 mg total) by mouth at bedtime. [name], MD  Active   glucose blood (ACCU-CHEK GUIDE) test strip [id number]  Use as instructed to check blood sugar 3 times daily. [name], MD  Active   Lancet Devices [facility]) lancets [id number]  1 each by Other route 3 (three) times daily. Boykin Nearing, MD  Active Self  levalbuterol [facility] HFA) 45 MCG/ACT inhaler [id number]  Inhale 2 puffs into the lungs every 6 (six) hours as needed for wheezing or shortness of breath. Garnet Sierras, DO  Active   mometasone (ELOCON) 0.1 % cream [id number]  Apply 1 application topically daily. Marzetta Board, DPM  Active   sacubitril-valsartan (ENTRESTO) 24-26 MG [id number]  Take 1 tablet by mouth 2 (two) times daily. Lelon Perla, MD  Active   sitaGLIPtin (JANUVIA) 25 MG tablet [id number]  Take 1 tablet (25 mg total) by mouth daily. [name], MD  Active   tamsulosin [facility]) 0.4 MG CAPS capsule [id number]  Take 2 capsules by mouth once daily [name], MD  Active           Patient Active Problem List   Diagnosis Date Noted  . CKD (chronic kidney disease) stage 2, GFR 60-89 ml/min 10/24/2019  . Acute non-recurrent frontal sinusitis 06/27/2019  . COVID-19 virus infection 06/27/2019  . Glaucoma suspect 12/22/2018  . Seasonal and perennial allergic rhinoconjunctivitis 10/25/2018  . 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 Urticaria 05/03/2018  . S/P inguinal hernia repair 03/01/2018  . Unilateral inguinal hernia without obstruction or gangrene 07/24/2017  . 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  . Hypersomnia 02/28/2016  . Abnormal PFT 02/24/2016  . Shortness of breath 01/28/2016  . Diabetic polyneuropathy associated with type 2 diabetes mellitus ([place]) 12/18/2015  . Lumbar back pain 12/18/2015  . Controlled type 2 diabetes mellitus with complication, with long-term current use of insulin ([place]) 09/17/2015  . 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 Lesion of right median nerve at forearm 04/16/2015  . ICD (implantable cardioverter-defibrillator) in place 03/28/2014  . Chronic systolic congestive heart failure ([place]) 03/28/2014  . Nonischemic cardiomyopathy ([place]) 11/09/2013  . Syncope 10/20/2013  . Chronic chest pain 07/28/2013  .  RENAL CALCULUS 05/13/2009  . Obesity 05/08/2009  . Former heavy cigarette smoker (20-39 per day) 05/08/2009  . HOARSENESS 05/08/2009  . ANXIETY 02/28/2008  . ASTHMATIC BRONCHITIS, ACUTE 02/28/2008  . GERD 02/28/2008  . IRRITABLE BOWEL SYNDROME 02/28/2008    Conditions to be addressed/monitored per PCP order:  dental and utility resources  There are no care plans that you recently modified to display for this patient.   Follow up:  Patient agrees to Care Plan and Follow-up.  Plan: The Managed Medicaid care management team will reach out to the patient again over the next 30 days.  Date/time of next scheduled Social Work care management/care coordination outreach:  09/20/20  Mickel Fuchs, [place], [place]  High Risk Managed Medicaid Team

## 2020-08-30 ENCOUNTER — Other Ambulatory Visit: Payer: Self-pay | Admitting: Cardiology

## 2020-08-30 DIAGNOSIS — I10 Essential (primary) hypertension: Secondary | ICD-10-CM

## 2020-09-10 ENCOUNTER — Ambulatory Visit (INDEPENDENT_AMBULATORY_CARE_PROVIDER_SITE_OTHER): Payer: Medicaid Other

## 2020-09-10 DIAGNOSIS — I5022 Chronic systolic (congestive) heart failure: Secondary | ICD-10-CM

## 2020-09-10 DIAGNOSIS — Z95810 Presence of automatic (implantable) cardiac defibrillator: Secondary | ICD-10-CM

## 2020-09-11 NOTE — Progress Notes (Signed)
EPIC Encounter for ICM Monitoring  Patient Name: [name] is a 55 y.o. male Date: 09/11/2020 Primary Care Physican: [name], MD Primary Cardiologist:Crenshaw Electrophysiologist: Rayann Heman 09/11/2020 [id number]  Spoke with patient and reports feeling well at this time.  Denies fluid symptoms.   He did have some dizziness and lightheadedness yesterday which could correlate with impedance suggesting dryness.  He has not taken any extra Furosemide or any GI symptoms.    [place] thoracic impedancesuggesting possible dryness since 09/09/2020.  Prescribed dosage:Furosemide 80 mgtake1tablet (80 mg total)twice a day.   Labs: 07/17/2020 Creatinine 1.66, BUN 13, Potassium 3.7, Sodium 142, GFR 46-53 Complete set of lab results in result review.  Recommendations:  Advised to drink a couple of extra glasses of fluid today and drink 64 oz a day to stay hydrated.    Follow-up plan: ICM clinic phone appointment on5/02/2021. 91 day device clinic remote transmission6/17/2022.   EP/Cardiology Office Visits:5/16/2022with Dr.Crenshaw.   Copy of ICM check sent to Dr.Allred  3 month ICM trend: 09/10/2020.    1 Year ICM trend:       [name], RN 09/11/2020 10:23 AM

## 2020-09-20 ENCOUNTER — Other Ambulatory Visit: Payer: Self-pay

## 2020-09-20 NOTE — Patient Instructions (Signed)
Visit Information  [name] was given information about Medicaid Managed Care team care coordination services as a part of their [place] Medicaid benefit. [name] verbally consented to engagement with the [facility] Managed Care team.   For questions related to your [facility], please call: [phone] or visit the homepage here: [email or web address]  If you would like to schedule transportation through your [facility], please call the following number at least 2 days in advance of your appointment: [phone].   Call the Icard at [phone], at any time, 24 hours a day, 7 days a week. If you are in danger or need immediate medical attention call 911.  [name] - following are the goals we discussed in your visit today:  Goals Addressed   None     Social Worker will follow up in 30 days.   [name], BSW, [place]  High Risk Managed Medicaid Team  Following is a copy of your plan of care:  Patient Care Plan: Diabetes Type 2 (Adult)    Problem Identified: Glycemic Management (Diabetes, Type 2)     Long-Range Goal: Glycemic Management Optimized   Start Date: 06/14/2020  Expected End Date: 11/12/2020  Recent Progress: On track  Priority: High  Note:   CARE PLAN ENTRY Medicaid Managed Care (see longtitudinal plan of care for additional care plan information)  Objective:  Lab Results  Component Value Date   HGBA1C 8.7 (A) 04/09/2020 .   Lab Results  Component Value Date   CREATININE 1.51 (H) 10/14/2019   CREATININE 2.75 (H) 10/13/2018   CREATININE 1.66 (H) 06/28/2018 .   Marland Kitchen No results found for: EGFR . Patient reported cbg findings: Checking BS at random times-100's  Current Barriers:  Marland Kitchen Knowledge Deficits related to basic Diabetes pathophysiology and self  care/management-[name] is checking his blood sugar randomly, he does not like to prick his finger due to the pain. He reports that his A1C was high because he has not been able to exercise like he was, due [place] the [place]. He plans to increase his exercise when he feels better. He reports eating a healthy diabetic diet.  Case Manager Clinical Goal(s):  Marland Kitchen Over the next 60 days, patient will demonstrate improved adherence to prescribed treatment plan for diabetes self care/management as evidenced by:  . daily monitoring and recording of CBG . adherence to ADA/ carb modified diet . adherence to prescribed medication regimen . Increase exercise as tolerated, working up to 5-7 days a week for 30 min  Interventions:  . Provided education to patient about basic DM disease process . Reviewed medications with patient and discussed importance of medication adherence . Discussed plans with patient for ongoing care management follow up and provided patient with direct contact information for care management team . Reviewed scheduled/upcoming provider appointments including: 07/10/20 with PCP . Advised patient, providing education and rationale, to check cbg 2 hours after meals  and record, calling provider for findings outside established parameters. . Referral made to pharmacy team for assistance with medication review . Review of patient status, including review of consultants reports, relevant laboratory and other test results, and medications completed.  Patient Self Care Activities:  . Self administers oral medications as prescribed . Attends all scheduled provider appointments . Adheres to prescribed ADA/carb modified . set target A1C . check blood sugar at prescribed times . check  blood sugar if I feel it is too high or too low . enter blood sugar readings and medication or insulin into daily log . take the blood sugar log to all doctor visits . take the blood sugar meter to all  doctor visits  Initial goal documentation

## 2020-09-20 NOTE — Patient Outreach (Signed)
Medicaid Managed Care Social Work Note  09/20/2020 Name:  [name] MRN:  [id number] DOB:  1966/06/03  [name] is an 55 y.o. year old male who is a primary patient of [name] Pier, MD.  The [facility] Managed Care Coordination team was consulted for assistance with:  dental resources  [name] was given information about Medicaid Managed CareCoordination services today. [name] agreed to services and verbal consent obtained.  Engaged with patient  for by telephone forfollow up visit in response to referral for case management and/or care coordination services.   Assessments/Interventions:  Review of past medical history, allergies, medications, health status, including review of consultants reports, laboratory and other test data, was performed as part of comprehensive evaluation and provision of chronic care management services.  SDOH: (Social Determinant of Health) assessments and interventions performed:  BSW contacted patient to complete a follow up. Patient stated that he contacted the dental resources BSW provided and they stated they did not do root canals. Patient stated she has not found anywhere else to go. BSW will try to find another place for patient to go.    Advanced Directives Status:  Not addressed in this encounter.  Care Plan                 Allergies  Allergen Reactions  . Bee Venom Anaphylaxis    Medications Reviewed Today    Reviewed by [name], RMA (Registered Medical Assistant) on 08/03/20 at 1349  Med List Status: <None>  Medication Order Taking? Sig Documenting Provider Last Dose Status Informant  ACCU-CHEK FASTCLIX LANCETS [place] [id number]  Use to check blood sugar 3 times daily. [name] Pier, MD  Active   aspirin 81 MG tablet [id number]  Take 81 mg by mouth daily. [provider]  Active Self  atorvastatin (LIPITOR) 10 MG tablet [id number]  Take 1 tablet by mouth once daily [name] Pier, MD  Active    benzonatate (TESSALON) 200 MG capsule [id number]  Take 1 capsule (200 mg total) by mouth 3 (three) times daily as needed for cough. Elsie Stain, MD  Active   carvedilol (COREG) 6.25 MG tablet [id number]  Take 1 tablet (6.25 mg total) by mouth 2 (two) times daily. Lelon Perla, MD  Active   cetirizine (ZYRTEC) 10 MG tablet [id number]  Take 1 tablet (10 mg total) by mouth daily. Garnet Sierras, DO  Active   cyclobenzaprine (FLEXERIL) 5 MG tablet [id number]  Take 1 tablet (5 mg total) by mouth daily as needed for muscle spasms. [name] Pier, MD  Active   dapagliflozin propanediol (FARXIGA) 5 MG TABS tablet [id number]  Take 1 tablet (5 mg total) by mouth daily before breakfast. [name] Pier, MD  Active   EPINEPHrine 0.3 mg/0.3 mL IJ SOAJ injection [id number]  Use as directed for severe allergic reactions. Garnet Sierras, DO  Active   famotidine (PEPCID) 20 MG tablet [id number]  Take 1 tablet (20 mg total) by mouth daily. Garnet Sierras, DO  Active   fluticasone [facility]) 50 MCG/ACT nasal spray [id number]  Use 1-2 sprays in each nostril once a day as needed for nasal congestion. Garnet Sierras, DO  Active   furosemide (LASIX) 80 MG tablet [id number]  Take 1 tablet (80 mg total) by mouth 2 (two) times daily. Lelon Perla, MD  Active   gabapentin (NEURONTIN) 300 MG capsule [id number]  Take 1 capsule (300 mg total) by mouth at bedtime. Wynetta Emery,  Dalbert Batman, MD  Active   glucose blood (ACCU-CHEK GUIDE) test strip [id number]  Use as instructed to check blood sugar 3 times daily. [name] Pier, MD  Active   Lancet Devices [facility]) lancets [id number]  1 each by Other route 3 (three) times daily. Boykin Nearing, MD  Active Self  levalbuterol [facility] HFA) 45 MCG/ACT inhaler [id number]  Inhale 2 puffs into the lungs every 6 (six) hours as needed for wheezing or shortness of breath. Garnet Sierras, DO  Active   mometasone (ELOCON) 0.1 % cream [id number]  Apply 1 application topically daily. Marzetta Board, DPM  Active   sacubitril-valsartan (ENTRESTO) 24-26 MG [id number]  Take 1 tablet by mouth 2 (two) times daily. Lelon Perla, MD  Active   sitaGLIPtin (JANUVIA) 25 MG tablet [id number]  Take 1 tablet (25 mg total) by mouth daily. [name] Pier, MD  Active   tamsulosin [facility]) 0.4 MG CAPS capsule [id number]  Take 2 capsules by mouth once daily [name] Pier, MD  Active           Patient Active Problem List   Diagnosis Date Noted  . CKD (chronic kidney disease) stage 2, GFR 60-89 ml/min 10/24/2019  . Acute non-recurrent frontal sinusitis 06/27/2019  . COVID-19 virus infection 06/27/2019  . Glaucoma suspect 12/22/2018  . Seasonal and perennial allergic rhinoconjunctivitis 10/25/2018  . 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 Urticaria 05/03/2018  . S/P inguinal hernia repair 03/01/2018  . Unilateral inguinal hernia without obstruction or gangrene 07/24/2017  . 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  . Hypersomnia 02/28/2016  . Abnormal PFT 02/24/2016  . Shortness of breath 01/28/2016  . Diabetic polyneuropathy associated with type 2 diabetes mellitus ([place]) 12/18/2015  . Lumbar back pain 12/18/2015  . Controlled type 2 diabetes mellitus with complication, with long-term current use of insulin ([place]) 09/17/2015  . 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 Lesion of right median nerve at forearm 04/16/2015  . ICD (implantable cardioverter-defibrillator) in place 03/28/2014  . Chronic systolic congestive heart failure ([place]) 03/28/2014  . Nonischemic cardiomyopathy ([place]) 11/09/2013  . Syncope 10/20/2013  . Chronic chest pain 07/28/2013  . RENAL CALCULUS 05/13/2009  . Obesity 05/08/2009  . Former heavy cigarette smoker (20-39 per day) 05/08/2009  . HOARSENESS 05/08/2009  . ANXIETY 02/28/2008  . ASTHMATIC BRONCHITIS, ACUTE 02/28/2008  . GERD 02/28/2008  . IRRITABLE BOWEL SYNDROME 02/28/2008    Conditions to be  addressed/monitored per PCP order:  dental  There are no care plans that you recently modified to display for this patient.   Follow up:  Patient agrees to Care Plan and Follow-up.  Plan: The Managed Medicaid care management team will reach out to the patient again over the next 30 days.  Date/time of next scheduled Social Work care management/care coordination outreach:  10/22/20  Mickel Fuchs, [place], [place]  High Risk Managed Medicaid Team

## 2020-10-05 ENCOUNTER — Other Ambulatory Visit: Payer: Self-pay

## 2020-10-05 ENCOUNTER — Other Ambulatory Visit: Payer: Self-pay | Admitting: Cardiology

## 2020-10-05 ENCOUNTER — Encounter: Payer: Self-pay | Admitting: Internal Medicine

## 2020-10-05 ENCOUNTER — Other Ambulatory Visit: Payer: Self-pay | Admitting: Internal Medicine

## 2020-10-05 DIAGNOSIS — E1169 Type 2 diabetes mellitus with other specified complication: Secondary | ICD-10-CM

## 2020-10-05 DIAGNOSIS — I519 Heart disease, unspecified: Secondary | ICD-10-CM

## 2020-10-05 DIAGNOSIS — E1142 Type 2 diabetes mellitus with diabetic polyneuropathy: Secondary | ICD-10-CM

## 2020-10-05 DIAGNOSIS — I428 Other cardiomyopathies: Secondary | ICD-10-CM

## 2020-10-05 DIAGNOSIS — E785 Hyperlipidemia, unspecified: Secondary | ICD-10-CM

## 2020-10-05 MED ORDER — GABAPENTIN 300 MG PO CAPS: 300.0000 mg | ORAL_CAPSULE | Freq: Every day | ORAL | 3 refills | Status: DC

## 2020-10-05 MED ORDER — ENTRESTO 24-26 MG PO TABS: 1.0000 | ORAL_TABLET | Freq: Two times a day (BID) | ORAL | 5 refills | Status: DC

## 2020-10-05 NOTE — Telephone Encounter (Signed)
}  Notes to clinic:  review for continued use and refill Last filled on 06/15/2020   Requested Prescriptions  Pending Prescriptions Disp Refills   gabapentin (NEURONTIN) 300 MG capsule [Pharmacy Med Name: Gabapentin 300 MG Oral Capsule] 90 capsule 0    Sig: TAKE 1 CAPSULE BY MOUTH EVERY DAY AT BEDTIME      Neurology: Anticonvulsants - gabapentin Passed - 10/05/2020  9:07 AM      Passed - Valid encounter within last 12 months    Recent Outpatient Visits           2 months ago Type 2 diabetes mellitus with obesity ([place])   [place], Neoma Laming B, MD   5 months ago Type 2 diabetes mellitus with obesity [facility])   [place] Karle Plumber B, MD   11 months ago Type 2 diabetes mellitus with diabetic polyneuropathy, without long-term current use of insulin ([place])   [place], Deborah B, MD   1 year ago COVID-19 virus infection   [place], Deborah B, MD   1 year ago COVID-19 virus infection   [place], MD       Future Appointments             In 2 weeks Crenshaw, Denice Bors, MD [facility] [place], [facility]   In 1 month Garnet Sierras, DO [facility] [place]              Signed Prescriptions Disp Refills   FARXIGA 5 MG TABS tablet 30 tablet 0    Sig: TAKE 1 TABLET BY MOUTH ONCE DAILY BEFORE BREAKFAST      Endocrinology:  Diabetes - SGLT2 Inhibitors Failed - 10/05/2020  9:07 AM      Failed - Cr in normal range and within 360 days    Creat  Date Value Ref Range Status  04/24/2016 1.68 (H) 0.70 - 1.33 mg/dL Final    Comment:      For patients > or = 56 years of age: The upper reference limit for Creatinine is approximately 13% higher for people identified as African-American.      Creatinine, Ser  Date Value Ref Range Status  07/17/2020 1.66 (H) 0.76 - 1.27  mg/dL Final          Failed - HBA1C is between 0 and 7.9 and within 180 days    HbA1c, POC (controlled diabetic range)  Date Value Ref Range Status  04/09/2020 8.7 (A) 0.0 - 7.0 % Final   Hgb A1c MFr Bld  Date Value Ref Range Status  07/23/2020 8.2 (H) 4.8 - 5.6 % Final    Comment:             Prediabetes: 5.7 - 6.4          Diabetes: >6.4          Glycemic control for adults with diabetes: <7.0           Failed - AA eGFR in normal range and within 360 days    GFR, Est African American  Date Value Ref Range Status  12/18/2014 >89 mL/min Final   GFR calc Af Amer  Date Value Ref Range Status  07/17/2020 53 (L) >59 mL/min/1.73 Final    Comment:    **In accordance with recommendations from the NKF-ASN Task force,**   [facility] is in  the process of updating its eGFR calculation to the   2021 CKD-EPI creatinine equation that estimates kidney function   without a race variable.    GFR, Est Non African American  Date Value Ref Range Status  12/18/2014 >89 mL/min Final    Comment:      The estimated GFR is a calculation valid for adults (>=35 years old) that uses the CKD-EPI algorithm to adjust for age and sex. It is   not to be used for children, pregnant women, hospitalized patients,    patients on dialysis, or with rapidly changing kidney function. According to the NKDEP, eGFR >89 is normal, 60-89 shows mild impairment, 30-59 shows moderate impairment, 15-29 shows severe impairment and <15 is ESRD.      GFR calc non Af Amer  Date Value Ref Range Status  07/17/2020 46 (L) >59 mL/min/1.73 Final   GFR  Date Value Ref Range Status  09/08/2014 137.59 >60.00 mL/min Final          Passed - LDL in normal range and within 360 days    LDL Chol Calc (NIH)  Date Value Ref Range Status  10/14/2019 57 0 - 99 mg/dL Final          Passed - Valid encounter within last 6 months    Recent Outpatient Visits           2 months ago Type 2 diabetes mellitus with obesity ([place])    [place] Karle Plumber B, MD   5 months ago Type 2 diabetes mellitus with obesity ([place])   [place] [name], MD   11 months ago Type 2 diabetes mellitus with diabetic polyneuropathy, without long-term current use of insulin ([place])   [place], Deborah B, MD   1 year ago COVID-19 virus infection   [place], Deborah B, MD   1 year ago COVID-19 virus infection   [place], MD       Future Appointments             In 2 weeks Crenshaw, Denice Bors, MD [facility] [place], [facility]   In 1 month Garnet Sierras, DO [facility] [place]               atorvastatin (LIPITOR) 10 MG tablet 90 tablet 0    Sig: Take 1 tablet by mouth once daily      Cardiovascular:  Antilipid - Statins Passed - 10/05/2020  9:07 AM      Passed - Total Cholesterol in normal range and within 360 days    Cholesterol, Total  Date Value Ref Range Status  10/14/2019 115 100 - 199 mg/dL Final          Passed - LDL in normal range and within 360 days    LDL Chol Calc (NIH)  Date Value Ref Range Status  10/14/2019 57 0 - 99 mg/dL Final          Passed - HDL in normal range and within 360 days    HDL  Date Value Ref Range Status  10/14/2019 44 >39 mg/dL Final          Passed - Triglycerides in normal range and within 360 days    Triglycerides  Date Value Ref Range Status  10/14/2019 66 0 - 149 mg/dL Final  Passed - Patient is not pregnant      Passed - Valid encounter within last 12 months    Recent Outpatient Visits           2 months ago Type 2 diabetes mellitus with obesity ([place])   [place] Karle Plumber B, MD   5 months ago Type 2 diabetes mellitus with obesity [facility])   [place] Karle Plumber  B, MD   11 months ago Type 2 diabetes mellitus with diabetic polyneuropathy, without long-term current use of insulin ([place])   [place], Deborah B, MD   1 year ago COVID-19 virus infection   [place], Deborah B, MD   1 year ago COVID-19 virus infection   [place], MD       Future Appointments             In 2 weeks Crenshaw, Denice Bors, MD [place], [facility]   In 1 month Garnet Sierras, DO [facility] [place]

## 2020-10-10 DIAGNOSIS — E1122 Type 2 diabetes mellitus with diabetic chronic kidney disease: Secondary | ICD-10-CM | POA: Diagnosis not present

## 2020-10-10 DIAGNOSIS — D649 Anemia, unspecified: Secondary | ICD-10-CM | POA: Diagnosis not present

## 2020-10-10 DIAGNOSIS — N2581 Secondary hyperparathyroidism of renal origin: Secondary | ICD-10-CM | POA: Diagnosis not present

## 2020-10-10 DIAGNOSIS — I428 Other cardiomyopathies: Secondary | ICD-10-CM | POA: Diagnosis not present

## 2020-10-10 DIAGNOSIS — N1832 Chronic kidney disease, stage 3b: Secondary | ICD-10-CM | POA: Diagnosis not present

## 2020-10-10 DIAGNOSIS — N400 Enlarged prostate without lower urinary tract symptoms: Secondary | ICD-10-CM | POA: Diagnosis not present

## 2020-10-10 DIAGNOSIS — E278 Other specified disorders of adrenal gland: Secondary | ICD-10-CM | POA: Diagnosis not present

## 2020-10-10 NOTE — Progress Notes (Signed)
HPI: FU CM/CHF. Echo 2/15 revealed EF of 20-25% with diffuse hypokinesis and biatrial enlargement, mild MR and a small pericardial effusion. TSH in February 2015 normal. Cardiac catheterization in March of 2015 showed normal coronary arteries.Also with prior ICD. CPX August 2017 showed submaximal effort with moderate functional impairment primarily circulatory limited.Echocardiogram repeated September 2020. Ejection fraction 45 to 50%, moderate left ventricular hypertrophy, grade 1 diastolic dysfunction, mild left atrial enlargement. Since last seen,continues to have occasional chest pain.  It can last several days at a time without completely resolving.  No exacerbating factors.  He continues to describe dyspnea on exertion.  He has not had syncope.  Current Outpatient Medications  Medication Sig Dispense Refill  . ACCU-CHEK FASTCLIX LANCETS MISC Use to check blood sugar 3 times daily. 102 each 11  . aspirin 81 MG tablet Take 81 mg by mouth daily.    Marland Kitchen atorvastatin (LIPITOR) 10 MG tablet Take 1 tablet by mouth once daily 90 tablet 0  . carvedilol (COREG) 6.25 MG tablet Take 1 tablet by mouth twice daily 180 tablet 3  . cetirizine (ZYRTEC) 10 MG tablet Take 1 tablet (10 mg total) by mouth daily. 90 tablet 2  . cyclobenzaprine (FLEXERIL) 5 MG tablet Take 1 tablet (5 mg total) by mouth daily as needed for muscle spasms. 90 tablet 1  . famotidine (PEPCID) 20 MG tablet Take 1 tablet (20 mg total) by mouth daily. 90 tablet 2  . FARXIGA 5 MG TABS tablet TAKE 1 TABLET BY MOUTH ONCE DAILY BEFORE BREAKFAST 30 tablet 0  . fluticasone (FLONASE) 50 MCG/ACT nasal spray Use 1-2 sprays in each nostril once a day as needed for nasal congestion. 16 g 5  . furosemide (LASIX) 80 MG tablet Take 1 tablet (80 mg total) by mouth 2 (two) times daily. 180 tablet 3  . gabapentin (NEURONTIN) 300 MG capsule TAKE 1 CAPSULE BY MOUTH EVERY DAY AT BEDTIME 90 capsule 1  . glucose blood (ACCU-CHEK GUIDE) test strip Use  as instructed to check blood sugar 3 times daily. 100 each 11  . Lancet Devices (ACCU-CHEK SOFTCLIX) lancets 1 each by Other route 3 (three) times daily. 1 each 0  . levalbuterol (XOPENEX HFA) 45 MCG/ACT inhaler Inhale 2 puffs into the lungs every 6 (six) hours as needed for wheezing or shortness of breath. 15 g 1  . mometasone (ELOCON) 0.1 % cream Apply 1 application topically daily. 45 g 1  . sacubitril-valsartan (ENTRESTO) 24-26 MG Take 1 tablet by mouth 2 (two) times daily. 60 tablet 5  . sitaGLIPtin (JANUVIA) 25 MG tablet Take 1 tablet (25 mg total) by mouth daily. 90 tablet 4  . tamsulosin (FLOMAX) 0.4 MG CAPS capsule Take 2 capsules by mouth once daily 180 capsule 3  . triamcinolone (KENALOG) 0.1 % Apply cream to affected area BID 30 g 0  . EPINEPHrine 0.3 mg/0.3 mL IJ SOAJ injection Use as directed for severe allergic reactions. 2 each 2  . gabapentin (NEURONTIN) 300 MG capsule Take 1 capsule (300 mg total) by mouth at bedtime. 90 capsule 3   No current facility-administered medications for this visit.     Past Medical History:  Diagnosis Date  . AICD (automatic cardioverter/defibrillator) present   . Chronic bronchitis ([place])    "get it most q yr" (12/23/2013)  . Chronic systolic (congestive) heart failure ([place])   . Fracture of left humerus    a. 07/2013.  Marland Kitchen GERD (gastroesophageal reflux disease)    not  at present time  . Heart murmur    "born w/it"   . History of renal calculi   . Hypertension   . Kidney stones   . NICM (nonischemic cardiomyopathy) ([place])    a. 07/2013 Echo: EF 20-25%, diff HK, Gr2 DD, mild MR, mod dil LA/RA. EF 40% 2017 echo  . Obesity   . 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 Syncope   . Type II diabetes mellitus ([place]) 2006    Past Surgical History:  Procedure Laterality Date  . CARDIAC CATHETERIZATION  08/10/13  . CHOLECYSTECTOMY   ~ 2010  . IMPLANTABLE CARDIOVERTER DEFIBRILLATOR IMPLANT  12/23/2013   STJ Fortify ICD implanted by [name] for cardiomyopathy and syncope  . IMPLANTABLE CARDIOVERTER DEFIBRILLATOR IMPLANT N/A 12/23/2013   Procedure: IMPLANTABLE CARDIOVERTER DEFIBRILLATOR IMPLANT;  Surgeon: Coralyn Mark, MD;  Location: [facility] CATH LAB;  Service: Cardiovascular;  Laterality: N/A;  . INGUINAL HERNIA REPAIR Left 03/01/2018   Procedure: OPEN REPAIR OF LEFT INGUINAL HERNIA WITH MESH;  Surgeon: Greer Pickerel, MD;  Location: [facility] ORS;  Service: General;  Laterality: Left;  . LEFT HEART CATHETERIZATION WITH CORONARY ANGIOGRAM N/A 08/10/2013   Procedure: LEFT HEART CATHETERIZATION WITH CORONARY ANGIOGRAM;  Surgeon: Jettie Booze, MD;  Location: [facility] CATH LAB;  Service: Cardiovascular;  Laterality: N/A;  . ORIF HUMERUS FRACTURE Left 08/12/2013   Procedure: OPEN REDUCTION INTERNAL FIXATION (ORIF) HUMERAL SHAFT FRACTURE;  Surgeon: Newt Minion, MD;  Location: [facility];  Service: Orthopedics;  Laterality: Left;  Open Reduction Internal Fixation Left Humerus  . URETEROSCOPY     "laser for kidney stones"    Social History   Socioeconomic History  . Marital status: Single    Spouse name: Not on file  . Number of children: Not on file  . Years of education: Not on file  . Highest education level: Not on file  Occupational History  . Occupation: Disabled  Tobacco Use  . Smoking status: Former Smoker    Packs/day: 1.00    Years: 28.00    Pack years: 28.00    Types: Cigarettes    Quit date: 06/03/2014    Years since quitting: 6.3  . Smokeless tobacco: Never Used  Vaping Use  . Vaping Use: Never used  Substance and Sexual Activity  . Alcohol use: Yes    Alcohol/week: 0.0 standard drinks    Comment: 12/23/2013 "might drink a couple times/yr"  . Drug use: Yes    Types: Marijuana    Comment: 02/25/18- patient  states has not used marjuana  . Sexual activity: Yes  Other Topics Concern  . Not on file  Social History  Narrative   Pt lives with his brother    Social Determinants of Health   Financial Resource Strain: Not on file  Food Insecurity: Not on file  Transportation Needs: Not on file  Physical Activity: Not on file  Stress: Not on file  Social Connections: Not on file  Intimate Partner Violence: Not on file    Family History  Problem Relation Age of Onset  . Diabetes Father   . Arthritis Father   . Hypertension Father   . Diabetes Mother   . Arthritis Mother   . Hypertension Mother   . Hypertension Brother     ROS: no fevers or chills, productive cough, hemoptysis, dysphasia, odynophagia, melena, hematochezia, dysuria, hematuria, rash, seizure activity, orthopnea, PND, pedal  edema, claudication. Remaining systems are negative.  Physical Exam: Well-developed well-nourished in no acute distress.  Skin is warm and dry.  HEENT is normal.  Neck is supple.  Chest is clear to auscultation with normal expansion.  Cardiovascular exam is regular rate and rhythm.  Abdominal exam nontender or distended. No masses palpated. Extremities show no edema. neuro grossly intact  ECG-sinus rhythm at a rate of 72, cannot rule out septal infarct.  Personally reviewed  A/P  1 nonischemic cardiomyopathy-continue Entresto and carvedilol.  2 chronic systolic congestive heart failure-not volume overloaded on examination today.  We will continue diuretic at present dose.    3 chronic chest pain-symptoms have been atypical and longstanding.  Previous catheterization revealed no coronary disease.  Electrocardiogram shows no ST changes.  We will not pursue further ischemia evaluation.  4 hyperlipidemia-continue statin.  5 chronic stage III kidney disease-Per nephrology.  6 ICD-Per electrophysiology.  [name], MD

## 2020-10-15 ENCOUNTER — Ambulatory Visit (INDEPENDENT_AMBULATORY_CARE_PROVIDER_SITE_OTHER): Payer: Medicaid Other

## 2020-10-15 DIAGNOSIS — Z95810 Presence of automatic (implantable) cardiac defibrillator: Secondary | ICD-10-CM

## 2020-10-15 DIAGNOSIS — I5022 Chronic systolic (congestive) heart failure: Secondary | ICD-10-CM | POA: Diagnosis not present

## 2020-10-15 NOTE — Progress Notes (Signed)
EPIC Encounter for ICM Monitoring  Patient Name: [name] is a 55 y.o. male Date: 10/15/2020 Primary Care Physican: [name], MD Primary Cardiologist:Crenshaw Electrophysiologist: Allred 4/5/2022Weight236lbs  Transmission reviewed.  Corvue thoracic impedancesuggesting normal fluid levels.  Prescribed dosage:Furosemide 80 mgtake1tablet (80 mg total)twice a day.   Labs: 07/17/2020 Creatinine 1.66, BUN 13, Potassium 3.7, Sodium 142, GFR 46-53 Complete set of lab results in result review.  Recommendations:No changes.  Follow-up plan: ICM clinic phone appointment on6/20/2022. 91 day device clinic remote transmission6/17/2022.   EP/Cardiology Office Visits:5/16/2022with Dr.Crenshaw.   Copy of ICM check sent to Dr.Allred   3 month ICM trend: 10/15/2020.    1 Year ICM trend:       [name], RN 10/15/2020 2:47 PM

## 2020-10-17 ENCOUNTER — Encounter: Payer: Self-pay | Admitting: Internal Medicine

## 2020-10-17 NOTE — Progress Notes (Signed)
Received note from Dr. Royce Macadamia.  Patient seen 10/2020. 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  UA without protein and no RBCs.  Patient told to integrate some high potassium foods into his diet. Vitamin D deficiency with secondary hyperparathyroidism: Vitamin D 1000 units daily.

## 2020-10-22 ENCOUNTER — Other Ambulatory Visit: Payer: Self-pay

## 2020-10-22 ENCOUNTER — Encounter: Payer: Self-pay | Admitting: Cardiology

## 2020-10-22 ENCOUNTER — Ambulatory Visit (INDEPENDENT_AMBULATORY_CARE_PROVIDER_SITE_OTHER): Payer: Medicaid Other | Admitting: Cardiology

## 2020-10-22 VITALS — BP 90/62 | HR 72 | Ht 72.0 in | Wt 246.4 lb

## 2020-10-22 DIAGNOSIS — Z95810 Presence of automatic (implantable) cardiac defibrillator: Secondary | ICD-10-CM | POA: Diagnosis not present

## 2020-10-22 DIAGNOSIS — I428 Other cardiomyopathies: Secondary | ICD-10-CM

## 2020-10-22 DIAGNOSIS — I5022 Chronic systolic (congestive) heart failure: Secondary | ICD-10-CM

## 2020-10-22 DIAGNOSIS — I10 Essential (primary) hypertension: Secondary | ICD-10-CM

## 2020-10-22 NOTE — Patient Instructions (Signed)

## 2020-10-22 NOTE — Patient Outreach (Signed)
Medicaid Managed Care Social Work Note  10/22/2020 Name:  [name] MRN:  [id number] DOB:  1965-10-14  [name] is an 55 y.o. year old male who is a primary patient of [name] Pier, MD.  The [facility] Managed Care Coordination team was consulted for assistance with:  assistance with finding a dentist to get a root canal  [name] was given information about Medicaid Managed CareCoordination services today. [name] agreed to services and verbal consent obtained.  Engaged with patient  for by telephone forfollow up visit in response to referral for case management and/or care coordination services.   Assessments/Interventions:  Review of past medical history, allergies, medications, health status, including review of consultants reports, laboratory and other test data, was performed as part of comprehensive evaluation and provision of chronic care management services.  SDOH: (Social Determinant of Health) assessments and interventions performed:  BSW contacted [employer] and they stated the do accept Medicaid for 12-21 only. BSW contacted [facility] and left a voicemail for a telephone call back to inquire about qualifications for the program. BSW informed patient that she is waiting for a call back from them. Once BSW receives telephone call back bsw will contact patient.Patient states no other resources are needed at this time.   Advanced Directives Status:  Not addressed in this encounter.  Care Plan                 Allergies  Allergen Reactions  . Bee Venom Anaphylaxis    Medications Reviewed Today    Reviewed by Jackelyn Knife, RMA (Registered Medical Assistant) on 08/03/20 at 1349  Med List Status: <None>  Medication Order Taking? Sig Documenting Provider Last Dose Status Informant  ACCU-CHEK FASTCLIX LANCETS [place] [id number]  Use to check blood sugar 3 times daily. [name] Pier, MD  Active   aspirin 81 MG tablet [id number]   Take 81 mg by mouth daily. [provider]  Active Self  atorvastatin (LIPITOR) 10 MG tablet [id number]  Take 1 tablet by mouth once daily [name] Pier, MD  Active   benzonatate (TESSALON) 200 MG capsule [id number]  Take 1 capsule (200 mg total) by mouth 3 (three) times daily as needed for cough. Elsie Stain, MD  Active   carvedilol (COREG) 6.25 MG tablet [id number]  Take 1 tablet (6.25 mg total) by mouth 2 (two) times daily. Lelon Perla, MD  Active   cetirizine (ZYRTEC) 10 MG tablet [id number]  Take 1 tablet (10 mg total) by mouth daily. Garnet Sierras, DO  Active   cyclobenzaprine (FLEXERIL) 5 MG tablet [id number]  Take 1 tablet (5 mg total) by mouth daily as needed for muscle spasms. [name] Pier, MD  Active   dapagliflozin propanediol (FARXIGA) 5 MG TABS tablet [id number]  Take 1 tablet (5 mg total) by mouth daily before breakfast. [name] Pier, MD  Active   EPINEPHrine 0.3 mg/0.3 mL IJ SOAJ injection [id number]  Use as directed for severe allergic reactions. Garnet Sierras, DO  Active   famotidine (PEPCID) 20 MG tablet [id number]  Take 1 tablet (20 mg total) by mouth daily. Garnet Sierras, DO  Active   fluticasone [facility]) 50 MCG/ACT nasal spray [id number]  Use 1-2 sprays in each nostril once a day as needed for nasal congestion. Garnet Sierras, DO  Active   furosemide (LASIX) 80 MG tablet [id number]  Take 1 tablet (80 mg total) by mouth 2 (two) times  daily. Lelon Perla, MD  Active   gabapentin (NEURONTIN) 300 MG capsule [id number]  Take 1 capsule (300 mg total) by mouth at bedtime. [name] Pier, MD  Active   glucose blood (ACCU-CHEK GUIDE) test strip [id number]  Use as instructed to check blood sugar 3 times daily. [name] Pier, MD  Active   Lancet Devices [facility]) lancets [id number]  1 each by Other route 3 (three) times daily. Boykin Nearing, MD  Active Self  levalbuterol [facility] HFA) 45 MCG/ACT inhaler [id number]  Inhale 2 puffs into the lungs  every 6 (six) hours as needed for wheezing or shortness of breath. Garnet Sierras, DO  Active   mometasone (ELOCON) 0.1 % cream [id number]  Apply 1 application topically daily. Marzetta Board, DPM  Active   sacubitril-valsartan (ENTRESTO) 24-26 MG [id number]  Take 1 tablet by mouth 2 (two) times daily. Lelon Perla, MD  Active   sitaGLIPtin (JANUVIA) 25 MG tablet [id number]  Take 1 tablet (25 mg total) by mouth daily. [name] Pier, MD  Active   tamsulosin [facility]) 0.4 MG CAPS capsule [id number]  Take 2 capsules by mouth once daily [name] Pier, MD  Active           Patient Active Problem List   Diagnosis Date Noted  . CKD (chronic kidney disease) stage 2, GFR 60-89 ml/min 10/24/2019  . Acute non-recurrent frontal sinusitis 06/27/2019  . COVID-19 virus infection 06/27/2019  . Glaucoma suspect 12/22/2018  . Seasonal and perennial allergic rhinoconjunctivitis 10/25/2018  . 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 Urticaria 05/03/2018  . S/P inguinal hernia repair 03/01/2018  . Unilateral inguinal hernia without obstruction or gangrene 07/24/2017  . 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  . Hypersomnia 02/28/2016  . Abnormal PFT 02/24/2016  . Shortness of breath 01/28/2016  . Diabetic polyneuropathy associated with type 2 diabetes mellitus ([place]) 12/18/2015  . Lumbar back pain 12/18/2015  . Controlled type 2 diabetes mellitus with complication, with long-term current use of insulin ([place]) 09/17/2015  . 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 Lesion of right median nerve at forearm 04/16/2015  . ICD (implantable cardioverter-defibrillator) in place 03/28/2014  . Chronic systolic congestive heart failure ([place]) 03/28/2014  . Nonischemic cardiomyopathy ([place]) 11/09/2013  . Syncope 10/20/2013  . Chronic chest pain 07/28/2013  . RENAL CALCULUS 05/13/2009  . Obesity 05/08/2009  . Former heavy cigarette smoker (20-39 per day) 05/08/2009  .  HOARSENESS 05/08/2009  . ANXIETY 02/28/2008  . ASTHMATIC BRONCHITIS, ACUTE 02/28/2008  . GERD 02/28/2008  . IRRITABLE BOWEL SYNDROME 02/28/2008    Conditions to be addressed/monitored per PCP order:  dental resources  There are no care plans that you recently modified to display for this patient.   Follow up:  Patient agrees to Care Plan and Follow-up.  Plan: The Managed Medicaid care management team will reach out to the patient again over the next 14 days.  Date/time of next scheduled Social Work care management/care coordination outreach:  11/09/20  Mickel Fuchs, Arita Miss, [place] Managed Medicaid Team  [phone]

## 2020-10-22 NOTE — Patient Instructions (Signed)
Visit Information  [name]. [name] was given information about Medicaid Managed Care team care coordination services as a part of their [place] Medicaid benefit. [name] verbally consented to engagement with the [facility] Managed Care team.   For questions related to your [facility], please call: [phone] or visit the homepage here: [email or web address]  If you would like to schedule transportation through your [facility], please call the following number at least 2 days in advance of your appointment: [phone].   Call the [place] at [phone], at any time, 24 hours a day, 7 days a week. If you are in danger or need immediate medical attention call 911.  [name] - following are the goals we discussed in your visit today:  Goals Addressed   None     Social Worker will follow up in 14 days.   [name], BSW, [place]  High Risk Managed Medicaid Team  [phone]  Following is a copy of your plan of care:  Patient Care Plan: Diabetes Type 2 (Adult)    Problem Identified: Glycemic Management (Diabetes, Type 2)     Long-Range Goal: Glycemic Management Optimized   Start Date: 06/14/2020  Expected End Date: 11/12/2020  Recent Progress: On track  Priority: High  Note:   CARE PLAN ENTRY Medicaid Managed Care (see longtitudinal plan of care for additional care plan information)  Objective:  Lab Results  Component Value Date   HGBA1C 8.7 (A) 04/09/2020 .   Lab Results  Component Value Date   CREATININE 1.51 (H) 10/14/2019   CREATININE 2.75 (H) 10/13/2018   CREATININE 1.66 (H) 06/28/2018 .   Marland Kitchen No results found for: EGFR . Patient reported cbg findings: Checking BS at random times-100's  Current Barriers:  Marland Kitchen Knowledge Deficits related to basic Diabetes pathophysiology and self  care/management-[name] is checking his blood sugar randomly, he does not like to prick his finger due to the pain. He reports that his A1C was high because he has not been able to exercise like he was, due [place]. He plans to increase his exercise when he feels better. He reports eating a healthy diabetic diet.  Case Manager Clinical Goal(s):  Marland Kitchen Over the next 60 days, patient will demonstrate improved adherence to prescribed treatment plan for diabetes self care/management as evidenced by:  . daily monitoring and recording of CBG . adherence to ADA/ carb modified diet . adherence to prescribed medication regimen . Increase exercise as tolerated, working up to 5-7 days a week for 30 min  Interventions:  . Provided education to patient about basic DM disease process . Reviewed medications with patient and discussed importance of medication adherence . Discussed plans with patient for ongoing care management follow up and provided patient with direct contact information for care management team . Reviewed scheduled/upcoming provider appointments including: 07/10/20 with PCP . Advised patient, providing education and rationale, to check cbg 2 hours after meals  and record, calling provider for findings outside established parameters. . Referral made to pharmacy team for assistance with medication review . Review of patient status, including review of consultants reports, relevant laboratory and other test results, and medications completed.  Patient Self Care Activities:  . Self administers oral medications as prescribed . Attends all scheduled provider appointments . Adheres to prescribed ADA/carb modified . set target A1C . check blood sugar at prescribed  times . check blood sugar if I feel it is too high or too low . enter blood sugar readings and medication or insulin into daily log . take the blood sugar log to all doctor visits . take the blood sugar meter to all  doctor visits  Initial goal documentation

## 2020-10-28 ENCOUNTER — Encounter: Payer: Self-pay | Admitting: Internal Medicine

## 2020-10-28 DIAGNOSIS — M62838 Other muscle spasm: Secondary | ICD-10-CM

## 2020-10-28 MED ORDER — CYCLOBENZAPRINE HCL 5 MG PO TABS: 5.0000 mg | ORAL_TABLET | Freq: Every day | ORAL | 1 refills | Status: DC | PRN

## 2020-11-01 ENCOUNTER — Telehealth: Payer: Self-pay

## 2020-11-01 NOTE — Telephone Encounter (Signed)
**Note De-Identified Selen Smucker Obfuscation** I started a Entresto PA through covermymeds. Key: [id number]

## 2020-11-05 ENCOUNTER — Encounter: Payer: Self-pay | Admitting: Internal Medicine

## 2020-11-05 MED ORDER — METHOCARBAMOL 500 MG PO TABS: 500.0000 mg | ORAL_TABLET | Freq: Every day | ORAL | 1 refills | Status: DC | PRN

## 2020-11-09 ENCOUNTER — Other Ambulatory Visit: Payer: Self-pay

## 2020-11-09 NOTE — Patient Instructions (Signed)
Visit Information  Mr. Baty was given information about Medicaid Managed Care team care coordination services as a part of their [place] Medicaid benefit. CLAYTEN ALLCOCK verbally consented to engagement with the [facility] Managed Care team.   For questions related to your [facility], please call: [phone] or visit the homepage here: [email or web address]  If you would like to schedule transportation through your [facility], please call the following number at least 2 days in advance of your appointment: [phone].   Call the [place] at [phone], at any time, 24 hours a day, 7 days a week. If you are in danger or need immediate medical attention call 911.  Mr. Vanloan - following are the goals we discussed in your visit today:  Goals Addressed   None       Social Worker will follow up in 7 days.   Mickel Fuchs, BSW, [place]  High Risk Managed Medicaid Team  [phone]  Following is a copy of your plan of care:  Patient Care Plan: Diabetes Type 2 (Adult)    Problem Identified: Glycemic Management (Diabetes, Type 2)     Long-Range Goal: Glycemic Management Optimized   Start Date: 06/14/2020  Expected End Date: 11/12/2020  Recent Progress: On track  Priority: High  Note:   CARE PLAN ENTRY Medicaid Managed Care (see longtitudinal plan of care for additional care plan information)  Objective:  Lab Results  Component Value Date   HGBA1C 8.7 (A) 04/09/2020 .   Lab Results  Component Value Date   CREATININE 1.51 (H) 10/14/2019   CREATININE 2.75 (H) 10/13/2018   CREATININE 1.66 (H) 06/28/2018 .   Marland Kitchen No results found for: EGFR . Patient reported cbg findings: Checking BS at random times-100's  Current Barriers:  Marland Kitchen Knowledge Deficits related to basic Diabetes pathophysiology and  self care/management-Mr Harmening is checking his blood sugar randomly, he does not like to prick his finger due to the pain. He reports that his A1C was high because he has not been able to exercise like he was, due [place]. He plans to increase his exercise when he feels better. He reports eating a healthy diabetic diet.  Case Manager Clinical Goal(s):  Marland Kitchen Over the next 60 days, patient will demonstrate improved adherence to prescribed treatment plan for diabetes self care/management as evidenced by:  . daily monitoring and recording of CBG . adherence to ADA/ carb modified diet . adherence to prescribed medication regimen . Increase exercise as tolerated, working up to 5-7 days a week for 30 min  Interventions:  . Provided education to patient about basic DM disease process . Reviewed medications with patient and discussed importance of medication adherence . Discussed plans with patient for ongoing care management follow up and provided patient with direct contact information for care management team . Reviewed scheduled/upcoming provider appointments including: 07/10/20 with PCP . Advised patient, providing education and rationale, to check cbg 2 hours after meals  and record, calling provider for findings outside established parameters. . Referral made to pharmacy team for assistance with medication review . Review of patient status, including review of consultants reports, relevant laboratory and other test results, and medications completed.  Patient Self Care Activities:  . Self administers oral medications as prescribed . Attends all scheduled provider appointments . Adheres to prescribed ADA/carb modified . set target A1C . check blood sugar  at prescribed times . check blood sugar if I feel it is too high or too low . enter blood sugar readings and medication or insulin into daily log . take the blood sugar log to all doctor visits . take the blood sugar meter to  all doctor visits  Initial goal documentation

## 2020-11-09 NOTE — Patient Outreach (Signed)
Medicaid Managed Care Social Work Note  11/09/2020 Name:  [name] MRN:  [id number] DOB:  May 27, 1966  [name] is an 55 y.o. year old male who is a primary patient of [name] Pier, [name].  The [facility] Managed Care Coordination team was consulted for assistance with:  dental resources  [name] was given information about Medicaid Managed CareCoordination services today. [name] agreed to services and verbal consent obtained.  Engaged with patient  for by telephone forfollow up visit in response to referral for case management and/or care coordination services.   Assessments/Interventions:  Review of past medical history, allergies, medications, health status, including review of consultants reports, laboratory and other test data, was performed as part of comprehensive evaluation and provision of chronic care management services.  SDOH: (Social Determinant of Health) assessments and interventions performed:  BSW still has not received a telephone call back from [facility]. BSW made another attempt today and they are closed on Fridays. BSW will make another attempt next week.   Advanced Directives Status:  Not addressed in this encounter.  Care Plan                 Allergies  Allergen Reactions  . Bee Venom Anaphylaxis    Medications Reviewed Today    Reviewed by [name] (Physician) on 10/22/20 at 1612  Med List Status: <None>  Medication Order Taking? Sig Documenting Provider Last Dose Status Informant  ACCU-CHEK FASTCLIX LANCETS [place] [id number] Yes Use to check blood sugar 3 times daily. [name] Pier, [name] Taking Active   aspirin 81 MG tablet [id number] Yes Take 81 mg by mouth daily. Provider, Historical, [name] Taking Active Self  atorvastatin (LIPITOR) 10 MG tablet [id number] Yes Take 1 tablet by mouth once daily [name] Pier, [name] Taking Active   carvedilol (COREG) 6.25 MG tablet [id number] Yes Take 1 tablet by mouth twice daily  [name] Taking Active   cetirizine (ZYRTEC) 10 MG tablet [id number] Yes Take 1 tablet (10 mg total) by mouth daily. [name] Taking Active   cyclobenzaprine (FLEXERIL) 5 MG tablet [id number] Yes Take 1 tablet (5 mg total) by mouth daily as needed for muscle spasms. [name] Pier, [name] Taking Active            Med Note [name] Oct 22, 2020  4:09 PM) Patient has if needed  EPINEPHrine 0.3 mg/0.3 mL IJ SOAJ injection [id number]  Use as directed for severe allergic reactions. [name]  Active   famotidine (PEPCID) 20 MG tablet [id number] Yes Take 1 tablet (20 mg total) by mouth daily. [name] Taking Active   FARXIGA 5 MG TABS tablet [id number] Yes TAKE 1 TABLET BY MOUTH ONCE DAILY BEFORE BREAKFAST [name] Pier, [name] Taking Active   fluticasone [facility]) 50 MCG/ACT nasal spray [id number] Yes Use 1-2 sprays in each nostril once a day as needed for nasal congestion. [name] Taking Active   furosemide (LASIX) 80 MG tablet [id number] Yes Take 1 tablet (80 mg total) by mouth 2 (two) times daily. [name] Taking Active   gabapentin (NEURONTIN) 300 MG capsule [id number] Yes TAKE 1 CAPSULE BY MOUTH EVERY DAY AT BEDTIME [name] Pier, [name] Taking Active   gabapentin (NEURONTIN) 300 MG capsule [id number]  Take 1 capsule (300 mg total) by mouth at bedtime. [name] Pier, [name]  Consider Medication Status and  Discontinue (Duplicate)   glucose blood (ACCU-CHEK GUIDE) test strip [id number] Yes Use as instructed to check blood sugar 3 times daily. [name] Pier, [name] Taking Active   Lancet Devices [facility]) lancets [id number] Yes 1 each by Other route 3 (three) times daily. [name] Nearing, [name] Taking Active Self  levalbuterol [facility]) 45 MCG/ACT inhaler [id number] Yes Inhale 2 puffs into the lungs every 6 (six) hours as needed for wheezing or shortness of breath. [name] Taking Active   mometasone (ELOCON) 0.1 % cream  [id number] Yes Apply 1 application topically daily. [name], DPM Taking Active   sacubitril-valsartan (ENTRESTO) 24-26 MG [id number] Yes Take 1 tablet by mouth 2 (two) times daily. [name] Taking Active   sitaGLIPtin (JANUVIA) 25 MG tablet [id number] Yes Take 1 tablet (25 mg total) by mouth daily. [name] Pier, [name] Taking Active   tamsulosin [facility]) 0.4 MG CAPS capsule [id number] Yes Take 2 capsules by mouth once daily [name] Pier, [name] Taking Active   triamcinolone (KENALOG) 0.1 % [id number] Yes Apply cream to affected area BID [name] Pier, [name] Taking Active           Patient Active Problem List   Diagnosis Date Noted  . CKD (chronic kidney disease) stage 2, GFR 60-89 ml/min 10/24/2019  . Acute non-recurrent frontal sinusitis 06/27/2019  . COVID-19 virus infection 06/27/2019  . Glaucoma suspect 12/22/2018  . Seasonal and perennial allergic rhinoconjunctivitis 10/25/2018  . 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 Urticaria 05/03/2018  . S/P inguinal hernia repair 03/01/2018  . Unilateral inguinal hernia without obstruction or gangrene 07/24/2017  . 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  . Hypersomnia 02/28/2016  . Abnormal PFT 02/24/2016  . Shortness of breath 01/28/2016  . Diabetic polyneuropathy associated with type 2 diabetes mellitus ([place]) 12/18/2015  . Lumbar back pain 12/18/2015  . Controlled type 2 diabetes mellitus with complication, with long-term current use of insulin ([place]) 09/17/2015  . 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 Lesion of right median nerve at forearm 04/16/2015  . ICD (implantable cardioverter-defibrillator) in place 03/28/2014  . Chronic systolic congestive heart failure ([place]) 03/28/2014  . Nonischemic cardiomyopathy ([place]) 11/09/2013  . Syncope 10/20/2013  . Chronic chest pain 07/28/2013  . RENAL CALCULUS 05/13/2009  . Obesity 05/08/2009  . Former heavy cigarette smoker (20-39  per day) 05/08/2009  . HOARSENESS 05/08/2009  . ANXIETY 02/28/2008  . ASTHMATIC BRONCHITIS, ACUTE 02/28/2008  . GERD 02/28/2008  . IRRITABLE BOWEL SYNDROME 02/28/2008    Conditions to be addressed/monitored per PCP order:  dental resources  There are no care plans that you recently modified to display for this patient.   Follow up:  Patient agrees to Care Plan and Follow-up.  Plan: The Managed Medicaid care management team will reach out to the patient again over the next 7 days.  Date/time of next scheduled Social Work care management/care coordination outreach:  11/20/20  Mickel Fuchs, Arita Miss, [place] Medicaid Team  [phone]

## 2020-11-12 ENCOUNTER — Other Ambulatory Visit: Payer: Self-pay

## 2020-11-12 ENCOUNTER — Encounter: Payer: Self-pay | Admitting: Allergy

## 2020-11-12 ENCOUNTER — Ambulatory Visit (INDEPENDENT_AMBULATORY_CARE_PROVIDER_SITE_OTHER): Payer: Medicaid Other | Admitting: Allergy

## 2020-11-12 VITALS — BP 102/70 | HR 76 | Temp 97.8°F | Resp 18 | Ht 72.0 in | Wt 243.6 lb

## 2020-11-12 DIAGNOSIS — T782XXD Anaphylactic shock, unspecified, subsequent encounter: Secondary | ICD-10-CM

## 2020-11-12 DIAGNOSIS — T63481D Toxic effect of venom of other arthropod, accidental (unintentional), subsequent encounter: Secondary | ICD-10-CM

## 2020-11-12 DIAGNOSIS — H1013 Acute atopic conjunctivitis, bilateral: Secondary | ICD-10-CM

## 2020-11-12 DIAGNOSIS — J3089 Other allergic rhinitis: Secondary | ICD-10-CM

## 2020-11-12 DIAGNOSIS — R0602 Shortness of breath: Secondary | ICD-10-CM | POA: Diagnosis not present

## 2020-11-12 DIAGNOSIS — J302 Other seasonal allergic rhinitis: Secondary | ICD-10-CM | POA: Diagnosis not present

## 2020-11-12 DIAGNOSIS — L509 Urticaria, unspecified: Secondary | ICD-10-CM

## 2020-11-12 DIAGNOSIS — R21 Rash and other nonspecific skin eruption: Secondary | ICD-10-CM

## 2020-11-12 DIAGNOSIS — H1010 Acute atopic conjunctivitis, unspecified eye: Secondary | ICD-10-CM

## 2020-11-12 MED ORDER — TRIAMCINOLONE ACETONIDE 0.1 % EX CREA: 1.0000 "application " | TOPICAL_CREAM | Freq: Two times a day (BID) | CUTANEOUS | 0 refills | Status: DC | PRN

## 2020-11-12 MED ORDER — EPINEPHRINE 0.3 MG/0.3ML IJ SOAJ: 0.3000 mg | INTRAMUSCULAR | 2 refills | Status: DC | PRN

## 2020-11-12 MED ORDER — FAMOTIDINE 20 MG PO TABS: 20.0000 mg | ORAL_TABLET | Freq: Every day | ORAL | 2 refills | Status: DC

## 2020-11-12 MED ORDER — CETIRIZINE HCL 10 MG PO TABS: 10.0000 mg | ORAL_TABLET | Freq: Every day | ORAL | 2 refills | Status: DC

## 2020-11-12 NOTE — Assessment & Plan Note (Signed)
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 Most recently got stung a few years ago which required epinephrine use. Bloodwork was borderline positive to paper wasp. Interim history - No stings since last visit.   Avoid insect stings.  For mild symptoms you can take over the counter antihistamines such as Benadryl and monitor symptoms closely. If symptoms worsen or if you have severe symptoms including breathing issues, throat closure, significant swelling, whole body hives, severe diarrhea and vomiting, lightheadedness then inject epinephrine and seek immediate medical care afterwards.  Epinephrine refilled.

## 2020-11-12 NOTE — Patient Instructions (Addendum)
Urticaria  Continue Zyrtec (ceterizine) '10mg'$  once a day at night.   May take twice a day during flares.  Continue Pepcid (famotidine) '20mg'$  once a day in the morning.  May take twice a day during flares.   Take pictures when it flares.  Avoid the following potential triggers: alcohol, tight clothing, NSAIDs.  Seasonal and perennial allergic rhinoconjunctivitis  Bloodwork was positive to dust mites, cat, dog, grass. Borderline positive to mold, cockroach and tree pollen.  Continue zyrtec '10mg'$  daily.  May use Flonase 1-2 sprays in each nostril once a day as needed for nasal symptoms.   Continue environmental control measures.  Anaphylaxis due to hymenoptera venom  Avoid insect stings.  For mild symptoms you can take over the counter antihistamines such as Benadryl and monitor symptoms closely. If symptoms worsen or if you have severe symptoms including breathing issues, throat closure, significant swelling, whole body hives, severe diarrhea and vomiting, lightheadedness then inject epinephrine and seek immediate medical care afterwards.  Shortness of breath  May use levoalbuterol rescue inhaler 2 puffs every 4 to 6 hours as needed for shortness of breath, chest tightness, coughing, and wheezing. Monitor frequency of use.   Follow up in 6 months or sooner if needed.   Reducing Pollen Exposure . Pollen seasons: trees (spring), grass (summer) and ragweed/weeds (fall). Marland Kitchen Keep windows closed in your home and car to lower pollen exposure.  Susa Simmonds air conditioning in the bedroom and throughout the house if possible.  . Avoid going out in dry windy days - especially early morning. . Pollen counts are highest between 5 - 10 AM and on dry, hot and windy days.  . Save outside activities for late afternoon or after a heavy rain, when pollen levels are lower.  . Avoid mowing of grass if you have grass pollen allergy. Marland Kitchen Be aware that pollen can also be transported indoors on people  and pets.  . Dry your clothes in an automatic dryer rather than hanging them outside where they might collect pollen.  . Rinse hair and eyes before bedtime. Control of House Dust Mite Allergen . Dust mite allergens are a common trigger of allergy and asthma symptoms. While they can be found throughout the house, these microscopic creatures thrive in warm, humid environments such as bedding, upholstered furniture and carpeting. . Because so much time is spent in the bedroom, it is essential to reduce mite levels there.  . Encase pillows, mattresses, and box springs in special allergen-proof fabric covers or airtight, zippered plastic covers.  . Bedding should be washed weekly in hot water (130 F) and dried in a hot dryer. Allergen-proof covers are available for comforters and pillows that can't be regularly washed.  Wendee Copp the allergy-proof covers every few months. Minimize clutter in the bedroom. Keep pets out of the bedroom.  Marland Kitchen Keep humidity less than 50% by using a dehumidifier or air conditioning. You can buy a humidity measuring device called a hygrometer to monitor this.  . If possible, replace carpets with hardwood, linoleum, or washable area rugs. If that's not possible, vacuum frequently with a vacuum that has a HEPA filter. . Remove all upholstered furniture and non-washable window drapes from the bedroom. . Remove all non-washable stuffed toys from the bedroom.  Wash stuffed toys weekly. Pet Allergen Avoidance: . Contrary to popular opinion, there are no "hypoallergenic" breeds of dogs or cats. That is because people are not allergic to an animal's hair, but to an allergen found in the  animal's saliva, dander (dead skin flakes) or urine. Pet allergy symptoms typically occur within minutes. For some people, symptoms can build up and become most severe 8 to 12 hours after contact with the animal. People with severe allergies can experience reactions in public places if dander has been  transported on the pet owners' clothing. Marland Kitchen Keeping an animal outdoors is only a partial solution, since homes with pets in the yard still have higher concentrations of animal allergens. . Before getting a pet, ask your allergist to determine if you are allergic to animals. If your pet is already considered part of your family, try to minimize contact and keep the pet out of the bedroom and other rooms where you spend a great deal of time. . As with dust mites, vacuum carpets often or replace carpet with a hardwood floor, tile or linoleum. . High-efficiency particulate air (HEPA) cleaners can reduce allergen levels over time. . While dander and saliva are the source of cat and dog allergens, urine is the source of allergens from rabbits, hamsters, mice and [place] pigs; so ask a non-allergic family member to clean the animal's cage. . If you have a pet allergy, talk to your allergist about the potential for allergy immunotherapy (allergy shots). This strategy can often provide long-term relief.

## 2020-11-12 NOTE — Progress Notes (Signed)
Follow Up Note  RE: [name] MRN: [id number] DOB: [birth year] Date of Office Visit: 11/12/2020  Referring provider: [name], MD Primary care provider: [name], MD  Chief Complaint: Urticaria  History of Present Illness: I had the pleasure of seeing [name] for a follow up visit at the [facility] on 11/12/2020. [name] is a 55 y.o. male, who is being followed for urticaria, allergic rhinoconjunctivitis, hymenoptera allergy and shortness of breath. His previous allergy office visit was on 05/14/2020 with Dr. Maudie Mercury. Today is a regular follow up visit.  Urticaria  Breaking out about once per month and lasts about 1 days or so. The intensity of hives are less than before. No triggers noted. Currently taking zyrtec and famotidine once a day. The famotidine is also helping him with the reflux so [name] didn't try to wean off.   Uses topical triamcinolone for rash on the foot with good benefit.  Seasonal and perennial allergic rhinoconjunctivitis Some rhinorrhea and itchy eyes at times. Not using any nasal sprays or eye drops.   Anaphylaxis due to hymenoptera venom No stings since the last visit   Shortness of breath Denies any coughing, wheezing, chest tightness, nocturnal awakenings, ER/urgent care visits or prednisone use since the last visit. No albuterol. Believes that his shortness of breath is coming from his CHF.  Assessment and Plan: [name] is a 55 y.o. male with: Urticaria Past history - Rash/hives for 3-4 years but worse the last 7 months now having flat rash lasting up to 1 day. Describes them as pruritic and occurring on a daily basis. No triggers identified. Tried benadryl, zyrtec and Claritin with no benefit. Used oral steroids with minimal benefit. Interim history - one flare per month which is less intense than before.   Continue Zyrtec (cetrizine) '10mg'$  once a day at night.   May take twice a day during flares.  Continue  Pepcid (famotidine) '20mg'$  once a day in the morning.  May take twice a day during flares.   Take pictures when it flares.  Avoid the following potential triggers: alcohol, tight clothing, NSAIDs.  Seasonal and perennial allergic rhinoconjunctivitis Past history - Bloodwork was positive to dust mites, cat, dog, grass. Borderline positive to mold, cockroach and tree pollen. Interim history - mild symptoms but only taking zyrtec.   Continue zyrtec '10mg'$  daily.  May use Flonase 1-2 sprays in each nostril once a day as needed for nasal symptoms.   Continue environmental control measures.  Anaphylaxis due to hymenoptera venom Past history - Anaphylactic reaction to hymenoptera in the past and apparently was on allergy immunotherapy as a child for this. Most recently got stung a few years ago which required epinephrine use. Bloodwork was borderline positive to paper wasp. Interim history - No stings since last visit.   Avoid insect stings.  For mild symptoms you can take over the counter antihistamines such as Benadryl and monitor symptoms closely. If symptoms worsen or if you have severe symptoms including breathing issues, throat closure, significant swelling, whole body hives, severe diarrhea and vomiting, lightheadedness then inject epinephrine and seek immediate medical care afterwards.  Epinephrine refilled.  Shortness of breath No inhaler use. Has CHF.  May use levoalbuterol rescue inhaler 2 puffs every 4 to 6 hours as needed for shortness of breath, chest tightness, coughing, and wheezing. Monitor frequency of use.   Get spirometry at next visit.   Rash and other nonspecific skin eruption Rash on foot.  Use triamcinolone 0.1% cream twice a day as needed for eczema flares. Do not use on the face, neck, armpits or groin area. Do not use more than 3 weeks in a row.   Return in about 6 months (around 05/14/2021).  Meds ordered this encounter  Medications  . cetirizine (ZYRTEC)  10 MG tablet    Sig: Take 1 tablet (10 mg total) by mouth daily.    Dispense:  90 tablet    Refill:  2  . famotidine (PEPCID) 20 MG tablet    Sig: Take 1 tablet (20 mg total) by mouth daily.    Dispense:  90 tablet    Refill:  2  . EPINEPHrine 0.3 mg/0.3 mL IJ SOAJ injection    Sig: Inject 0.3 mg into the muscle as needed.    Dispense:  1 each    Refill:  2    May dispense generic/Mylan/Teva brand.  . triamcinolone cream (KENALOG) 0.1 %    Sig: Apply 1 application topically 2 (two) times daily as needed. Do not use on the face, neck, armpits or groin area. Do not use more than 3 weeks in a row.    Dispense:  30 g    Refill:  0   Lab Orders  No laboratory test(s) ordered today    Diagnostics: None.  Medication List:  Current Outpatient Medications  Medication Sig Dispense Refill  . ACCU-CHEK FASTCLIX LANCETS [place] Use to check blood sugar 3 times daily. 102 each 11  . aspirin 81 MG tablet Take 81 mg by mouth daily.    Marland Kitchen atorvastatin (LIPITOR) 10 MG tablet Take 1 tablet by mouth once daily 90 tablet 0  . carvedilol (COREG) 6.25 MG tablet Take 1 tablet by mouth twice daily 180 tablet 3  . EPINEPHrine 0.3 mg/0.3 mL IJ SOAJ injection Inject 0.3 mg into the muscle as needed. 1 each 2  . FARXIGA 5 MG TABS tablet TAKE 1 TABLET BY MOUTH ONCE DAILY BEFORE BREAKFAST 30 tablet 0  . fluticasone (FLONASE) 50 MCG/ACT nasal spray Use 1-2 sprays in each nostril once a day as needed for nasal congestion. 16 g 5  . furosemide (LASIX) 80 MG tablet Take 1 tablet (80 mg total) by mouth 2 (two) times daily. 180 tablet 3  . gabapentin (NEURONTIN) 300 MG capsule Take 1 capsule (300 mg total) by mouth at bedtime. 90 capsule 3  . glucose blood (ACCU-CHEK GUIDE) test strip Use as instructed to check blood sugar 3 times daily. 100 each 11  . Lancet Devices (ACCU-CHEK SOFTCLIX) lancets 1 each by Other route 3 (three) times daily. 1 each 0  . levalbuterol (XOPENEX HFA) 45 MCG/ACT inhaler Inhale 2 puffs into  the lungs every 6 (six) hours as needed for wheezing or shortness of breath. 15 g 1  . methocarbamol (ROBAXIN) 500 MG tablet Take 1 tablet (500 mg total) by mouth daily as needed for muscle spasms. 90 tablet 1  . mometasone (ELOCON) 0.1 % cream Apply 1 application topically daily. 45 g 1  . potassium chloride (KLOR-CON) 10 MEQ tablet Take 10 mEq by mouth daily.    . sacubitril-valsartan (ENTRESTO) 24-26 MG Take 1 tablet by mouth 2 (two) times daily. 60 tablet 5  . sitaGLIPtin (JANUVIA) 25 MG tablet Take 1 tablet (25 mg total) by mouth daily. 90 tablet 4  . tamsulosin (FLOMAX) 0.4 MG CAPS capsule Take 2 capsules by mouth once daily 180 capsule 3  . triamcinolone cream (KENALOG) 0.1 % Apply 1 application  topically 2 (two) times daily as needed. Do not use on the face, neck, armpits or groin area. Do not use more than 3 weeks in a row. 30 g 0  . cetirizine (ZYRTEC) 10 MG tablet Take 1 tablet (10 mg total) by mouth daily. 90 tablet 2  . famotidine (PEPCID) 20 MG tablet Take 1 tablet (20 mg total) by mouth daily. 90 tablet 2  . gabapentin (NEURONTIN) 300 MG capsule TAKE 1 CAPSULE BY MOUTH EVERY DAY AT BEDTIME (Patient not taking: Reported on 11/12/2020) 90 capsule 1   No current facility-administered medications for this visit.   Allergies: Allergies  Allergen Reactions  . Bee Venom Anaphylaxis   I reviewed his past medical history, social history, family history, and environmental history and no significant changes have been reported from his previous visit.  Review of Systems  Constitutional: Negative for appetite change, chills, fever and unexpected weight change.  HENT: Negative for congestion and rhinorrhea.   Eyes: Negative for itching.  Respiratory: Positive for shortness of breath. Negative for cough, chest tightness and wheezing.   Cardiovascular: Negative for chest pain.  Gastrointestinal: Negative for abdominal pain.  Genitourinary: Negative for difficulty urinating.  Skin: Positive  for rash.  Allergic/Immunologic: Positive for environmental allergies. Negative for food allergies.  Neurological: Negative for headaches.   Objective: BP 102/70 (BP Location: Right Arm, Patient Position: Sitting, Cuff Size: Large)   Pulse 76   Temp 97.8 F (36.6 C) (Temporal)   Resp 18   Ht 6' (1.829 m)   Wt 243 lb 9.6 oz (110.5 kg)   SpO2 96%   BMI 33.04 kg/m  Body mass index is 33.04 kg/m. Physical Exam Vitals and nursing note reviewed.  Constitutional:      Appearance: [name] is well-developed.  HENT:     Head: Normocephalic and atraumatic.     Right Ear: External ear normal.     Left Ear: External ear normal.     Nose: Nose normal.  Eyes:     Conjunctiva/sclera: Conjunctivae normal.  Cardiovascular:     Rate and Rhythm: Normal rate and regular rhythm.     Heart sounds: Normal heart sounds. No murmur heard. No friction rub. No gallop.   Pulmonary:     Effort: Pulmonary effort is normal.     Breath sounds: Normal breath sounds. No wheezing or rales.  Musculoskeletal:     Cervical back: Neck supple.  Skin:    General: Skin is warm.     Findings: No rash.  Neurological:     Mental Status: [name] is alert and oriented to person, place, and time.  Psychiatric:        Behavior: Behavior normal.    Previous notes and tests were reviewed. The plan was reviewed with the patient/family, and all questions/concerned were addressed.  It was my pleasure to see [name] today and participate in his care. Please feel free to contact me with any questions or concerns.  Sincerely,  Rexene Alberts, DO Allergy & Immunology  [facility] office: [place] office: [phone]

## 2020-11-12 NOTE — Assessment & Plan Note (Signed)
Past history - Rash/hives for 3-4 years but worse the last 7 months now having flat rash lasting up to 1 day. Describes them as pruritic and occurring on a daily basis. No triggers identified. Tried benadryl, zyrtec and Claritin with no benefit. Used oral steroids with minimal benefit. Interim history - one flare per month which is less intense than before.   Continue Zyrtec (cetrizine) '10mg'$  once a day at night.   May take twice a day during flares.  Continue Pepcid (famotidine) '20mg'$  once a day in the morning.  May take twice a day during flares.   Take pictures when it flares.  Avoid the following potential triggers: alcohol, tight clothing, NSAIDs.

## 2020-11-12 NOTE — Assessment & Plan Note (Signed)
Past history - Bloodwork was positive to dust mites, cat, dog, grass. Borderline positive to mold, cockroach and tree pollen. Interim history - mild symptoms but only taking zyrtec.   Continue zyrtec '10mg'$  daily.  May use Flonase 1-2 sprays in each nostril once a day as needed for nasal symptoms.   Continue environmental control measures.

## 2020-11-12 NOTE — Assessment & Plan Note (Signed)
No inhaler use. Has CHF.  May use levoalbuterol rescue inhaler 2 puffs every 4 to 6 hours as needed for shortness of breath, chest tightness, coughing, and wheezing. Monitor frequency of use.   Get spirometry at next visit.

## 2020-11-12 NOTE — Assessment & Plan Note (Addendum)
Rash on foot.  Use triamcinolone 0.1% cream twice a day as needed for eczema flares. Do not use on the face, neck, armpits or groin area. Do not use more than 3 weeks in a row.

## 2020-11-20 ENCOUNTER — Other Ambulatory Visit: Payer: Self-pay

## 2020-11-20 NOTE — Patient Instructions (Signed)
Visit Information  [name] was given information about Medicaid Managed Care team care coordination services as a part of their [place] Medicaid benefit. [name] verbally consented to engagement with the [facility] Managed Care team.   For questions related to your [facility], please call: [phone] or visit the homepage here: [email or web address]  If you would like to schedule transportation through your [facility], please call the following number at least 2 days in advance of your appointment: [phone].   Call the [place] at [phone], at any time, 24 hours a day, 7 days a week. If you are in danger or need immediate medical attention call 911.  [name] - following are the goals we discussed in your visit today:   Goals Addressed   None     Social Worker will follow up with patient in 30 days.   Mickel Fuchs, BSW, Rew  High Risk Managed Medicaid Team  [phone]   Following is a copy of your plan of care:  Patient Care Plan: Diabetes Type 2 (Adult)     Problem Identified: Glycemic Management (Diabetes, Type 2)      Long-Range Goal: Glycemic Management Optimized   Start Date: 06/14/2020  Expected End Date: 11/12/2020  Recent Progress: On track  Priority: High  Note:   CARE PLAN ENTRY Medicaid Managed Care (see longtitudinal plan of care for additional care plan information)  Objective:  Lab Results  Component Value Date   HGBA1C 8.7 (A) 04/09/2020   Lab Results  Component Value Date   CREATININE 1.51 (H) 10/14/2019   CREATININE 2.75 (H) 10/13/2018   CREATININE 1.66 (H) 06/28/2018   No results found for: EGFR Patient reported cbg findings: Checking BS at random times-100's  Current Barriers:  Knowledge Deficits related to basic Diabetes pathophysiology  and self care/management-[name] is checking his blood sugar randomly, he does not like to prick his finger due to the pain. He reports that his A1C was high because he has not been able to exercise like he was, due [place]. He plans to increase his exercise when he feels better. He reports eating a healthy diabetic diet.  Case Manager Clinical Goal(s):  Over the next 60 days, patient will demonstrate improved adherence to prescribed treatment plan for diabetes self care/management as evidenced by:  daily monitoring and recording of CBG adherence to ADA/ carb modified diet adherence to prescribed medication regimen Increase exercise as tolerated, working up to 5-7 days a week for 30 min  Interventions:  Provided education to patient about basic DM disease process Reviewed medications with patient and discussed importance of medication adherence Discussed plans with patient for ongoing care management follow up and provided patient with direct contact information for care management team Reviewed scheduled/upcoming provider appointments including: 07/10/20 with PCP Advised patient, providing education and rationale, to check cbg 2 hours after meals  and record, calling provider for findings outside established parameters. Referral made to pharmacy team for assistance with medication review Review of patient status, including review of consultants reports, relevant laboratory and other test results, and medications completed.  Patient Self Care Activities:  Self administers oral medications as prescribed Attends all scheduled provider appointments Adheres to prescribed ADA/carb modified set target A1C check blood sugar at prescribed times check blood sugar if I feel it is too high or too low enter blood  sugar readings and medication or insulin into daily log take the blood sugar log to all doctor visits take the blood sugar meter to all doctor visits  Initial goal  documentation

## 2020-11-20 NOTE — Patient Outreach (Signed)
Medicaid Managed Care Social Work Note  11/20/2020 Name:  [name] MRN:  [id number] DOB:  March 24, 1966  [name] is an 55 y.o. year old male who is a primary patient of [name] Pier, MD.  The [facility] Managed Care Coordination team was consulted for assistance with:   dental resources  [name] was given information about Medicaid Managed CareCoordination services today. [name] agreed to services and verbal consent obtained.  Engaged with patient  for by telephone forfollow up visit in response to referral for case management and/or care coordination services.   Assessments/Interventions:  Review of past medical history, allergies, medications, health status, including review of consultants reports, laboratory and other test data, was performed as part of comprehensive evaluation and provision of chronic care management services.  SDOH: (Social Determinant of Health) assessments and interventions performed:  BSW made another attempt to [facility] and was unsuccessful. BSW did more research and found that patient would have to get an [employer] in order to qualify for dental services. Patient does not qualify for the [facility] due to have insurance. BSW will continue to research for dental resources that can assist patient with root canal.  Advanced Directives Status:  Not addressed in this encounter.  Care Plan                 Allergies  Allergen Reactions   Bee Venom Anaphylaxis    Medications Reviewed Today     Reviewed by Garnet Sierras, DO (Physician) on 11/12/20 at 2209  Med List Status: <None>   Medication Order Taking? Sig Documenting Provider Last Dose Status Informant  ACCU-CHEK FASTCLIX LANCETS [place] [id number] Yes Use to check blood sugar 3 times daily. [name] Pier, MD Taking Active   aspirin 81 MG tablet [id number] Yes Take 81 mg by mouth daily. [provider] Taking Active Self  atorvastatin (LIPITOR) 10 MG tablet [id number] Yes Take  1 tablet by mouth once daily [name] Pier, MD Taking Active   carvedilol (COREG) 6.25 MG tablet [id number] Yes Take 1 tablet by mouth twice daily Lelon Perla, MD Taking Active   cetirizine (ZYRTEC) 10 MG tablet [id number]  Take 1 tablet (10 mg total) by mouth daily. Garnet Sierras, DO  Active   EPINEPHrine 0.3 mg/0.3 mL IJ SOAJ injection [id number] Yes Inject 0.3 mg into the muscle as needed. Garnet Sierras, DO  Active   famotidine (PEPCID) 20 MG tablet [id number]  Take 1 tablet (20 mg total) by mouth daily. Garnet Sierras, DO  Active   FARXIGA 5 MG TABS tablet [id number] Yes TAKE 1 TABLET BY MOUTH ONCE DAILY BEFORE BREAKFAST [name] Pier, MD Taking Active   fluticasone [facility]) 50 MCG/ACT nasal spray [id number] Yes Use 1-2 sprays in each nostril once a day as needed for nasal congestion. Garnet Sierras, DO Taking Active   furosemide (LASIX) 80 MG tablet [id number] Yes Take 1 tablet (80 mg total) by mouth 2 (two) times daily. Lelon Perla, MD Taking Active   gabapentin (NEURONTIN) 300 MG capsule [id number] No TAKE 1 CAPSULE BY MOUTH EVERY DAY AT BEDTIME  Patient not taking: Reported on 11/12/2020   [name] Pier, MD Not Taking Active   gabapentin (NEURONTIN) 300 MG capsule [id number] Yes Take 1 capsule (300 mg total) by mouth at bedtime. [name] Pier, MD Taking Active   glucose blood (ACCU-CHEK GUIDE) test strip [id number] Yes Use as instructed to check blood sugar 3 times daily.  [name] Pier, MD Taking Active   Lancet Devices [facility]) lancets [id number] Yes 1 each by Other route 3 (three) times daily. [name] Nearing, MD Taking Active Self  levalbuterol [facility] HFA) 45 MCG/ACT inhaler [id number] Yes Inhale 2 puffs into the lungs every 6 (six) hours as needed for wheezing or shortness of breath. Garnet Sierras, DO Taking Active   methocarbamol (ROBAXIN) 500 MG tablet [id number] Yes Take 1 tablet (500 mg total) by mouth daily as needed for muscle spasms. [name] Pier, MD  Taking Active   mometasone (ELOCON) 0.1 % cream [id number] Yes Apply 1 application topically daily. Marzetta Board, DPM Taking Active   potassium chloride (KLOR-CON) 10 MEQ tablet [id number] Yes Take 10 mEq by mouth daily. [provider] Taking Active   sacubitril-valsartan (ENTRESTO) 24-26 MG [id number] Yes Take 1 tablet by mouth 2 (two) times daily. Lelon Perla, MD Taking Active   sitaGLIPtin (JANUVIA) 25 MG tablet [id number] Yes Take 1 tablet (25 mg total) by mouth daily. [name] Pier, MD Taking Active   tamsulosin [facility]) 0.4 MG CAPS capsule [id number] Yes Take 2 capsules by mouth once daily [name] Pier, MD Taking Active   triamcinolone cream (KENALOG) 0.1 % [id number] Yes Apply 1 application topically 2 (two) times daily as needed. Do not use on the face, neck, armpits or groin area. Do not use more than 3 weeks in a row. 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   Urticaria 05/03/2018   S/P inguinal hernia repair 03/01/2018   Unilateral inguinal hernia without obstruction or gangrene 07/24/2017   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   Hypersomnia 02/28/2016   Abnormal PFT 02/24/2016   Shortness of breath 01/28/2016   Diabetic polyneuropathy associated with type 2 diabetes mellitus ([place]) 12/18/2015   Lumbar back pain 12/18/2015   Controlled type 2 diabetes mellitus with complication, with long-term current use of insulin ([place]) 09/17/2015   Rash and other nonspecific skin eruption 05/14/2015   Left median nerve neuropathy 04/16/2015   Lesion of right median nerve at forearm 04/16/2015    ICD (implantable cardioverter-defibrillator) in place [id number]   Chronic systolic congestive heart failure ([place]) 03/28/2014   Nonischemic cardiomyopathy ([place]) 11/09/2013   Syncope 10/20/2013   Chronic chest pain 07/28/2013   RENAL CALCULUS 05/13/2009   Obesity 05/08/2009   Former heavy cigarette smoker (20-39 per day) 05/08/2009   HOARSENESS 05/08/2009   ANXIETY 02/28/2008   ASTHMATIC BRONCHITIS, ACUTE 02/28/2008   GERD 02/28/2008   IRRITABLE BOWEL SYNDROME 02/28/2008    Conditions to be addressed/monitored per PCP order:   dental resources  There are no care plans that you recently modified to display for this patient.   Follow up:  Patient agrees to Care Plan and Follow-up.  Plan: The Managed Medicaid care management team will reach out to the patient again over the next 30 days.  Date/time of next scheduled Social Work care management/care coordination outreach:  12/20/20  Mickel Fuchs, Arita Miss, [place] Medicaid Team  [phone]

## 2020-11-23 ENCOUNTER — Ambulatory Visit (INDEPENDENT_AMBULATORY_CARE_PROVIDER_SITE_OTHER): Payer: Medicaid Other

## 2020-11-23 DIAGNOSIS — I428 Other cardiomyopathies: Secondary | ICD-10-CM

## 2020-11-23 DIAGNOSIS — I5022 Chronic systolic (congestive) heart failure: Secondary | ICD-10-CM

## 2020-11-26 ENCOUNTER — Ambulatory Visit (INDEPENDENT_AMBULATORY_CARE_PROVIDER_SITE_OTHER): Payer: Medicaid Other

## 2020-11-26 DIAGNOSIS — Z95810 Presence of automatic (implantable) cardiac defibrillator: Secondary | ICD-10-CM | POA: Diagnosis not present

## 2020-11-26 DIAGNOSIS — I5022 Chronic systolic (congestive) heart failure: Secondary | ICD-10-CM | POA: Diagnosis not present

## 2020-11-26 LAB — CUP PACEART REMOTE DEVICE CHECK
Battery Remaining Longevity: 47 mo
Battery Remaining Percentage: 45 %
Battery Voltage: 2.92 V
Brady Statistic RV Percent Paced: 1 %
Date Time Interrogation Session: 20220620044806
HighPow Impedance: 65 Ohm
HighPow Impedance: 65 Ohm
Implantable Lead Implant Date: 20150717
Implantable Lead Location: 753860
Implantable Pulse Generator Implant Date: 20150717
Lead Channel Impedance Value: 310 Ohm
Lead Channel Pacing Threshold Amplitude: 0.75 V
Lead Channel Pacing Threshold Pulse Width: 0.5 ms
Lead Channel Sensing Intrinsic Amplitude: 11.8 mV
Lead Channel Setting Pacing Amplitude: 2.5 V
Lead Channel Setting Pacing Pulse Width: 0.5 ms
Lead Channel Setting Sensing Sensitivity: 0.5 mV
Pulse Gen Serial Number: 7196009

## 2020-11-28 NOTE — Progress Notes (Signed)
EPIC Encounter for ICM Monitoring  Patient Name: [name] is a 55 y.o. male Date: 11/28/2020 Primary Care Physican: [name], MD Primary Cardiologist: Stanford Breed Electrophysiologist: Allred 11/28/2020 Weight 230 lbs                                               Spoke with patient and reports feeling well at this time. Heart failure questions reviewed. Pt asymptomatic.   [place] thoracic impedance suggesting normal fluid levels.   Prescribed dosage: Furosemide 80 mg take 1 tablet (80 mg total) twice a day.      Labs: 07/17/2020 Creatinine 1.66, BUN 13, Potassium 3.7, Sodium 142, GFR 46-53 Complete set of lab results in result review.    Recommendations:  No changes and encouraged to call if experiencing any fluid symptoms.   Follow-up plan: ICM clinic phone appointment on 12/31/2020.   91 day device clinic remote transmission 02/22/2021.     EP/Cardiology Office Visits: Recall 04/20/2021 with [name].  Message sent to EP scheduler 6/22 to contact patient for EP appointment (last visit 04/19/2018 with [name])   Copy of ICM check sent to Dr. Rayann Heman.   3 month ICM trend: 11/26/2020.    1 Year ICM trend:       [name], RN 11/28/2020 10:41 AM

## 2020-12-11 ENCOUNTER — Encounter: Payer: Self-pay | Admitting: Internal Medicine

## 2020-12-11 ENCOUNTER — Telehealth: Payer: Self-pay | Admitting: *Deleted

## 2020-12-11 ENCOUNTER — Other Ambulatory Visit: Payer: Self-pay

## 2020-12-11 ENCOUNTER — Other Ambulatory Visit: Payer: Self-pay | Admitting: Internal Medicine

## 2020-12-11 ENCOUNTER — Ambulatory Visit: Payer: Medicaid Other | Admitting: *Deleted

## 2020-12-11 MED ORDER — DAPAGLIFLOZIN PROPANEDIOL 5 MG PO TABS: 5.0000 mg | ORAL_TABLET | Freq: Every day | ORAL | 0 refills | Status: DC

## 2020-12-11 NOTE — Telephone Encounter (Signed)
Notes to clinic: Medication last filled on 10/05/2020 Review for continued use and refill    Requested Prescriptions  Pending Prescriptions Disp Refills   FARXIGA 5 MG TABS tablet [Pharmacy Med Name: Farxiga 5 MG Oral Tablet] 30 tablet 0    Sig: TAKE 1 TABLET BY MOUTH ONCE DAILY BEFORE BREAKFAST      Endocrinology:  Diabetes - SGLT2 Inhibitors Failed - 12/11/2020 10:41 AM      Failed - Cr in normal range and within 360 days    Creat  Date Value Ref Range Status  04/24/2016 1.68 (H) 0.70 - 1.33 mg/dL Final    Comment:      For patients > or = 56 years of age: The upper reference limit for Creatinine is approximately 13% higher for people identified as African-American.      Creatinine, Ser  Date Value Ref Range Status  07/17/2020 1.66 (H) 0.76 - 1.27 mg/dL Final          Failed - LDL in normal range and within 360 days    LDL Chol Calc (NIH)  Date Value Ref Range Status  10/14/2019 57 0 - 99 mg/dL Final          Failed - HBA1C is between 0 and 7.9 and within 180 days    HbA1c, POC (controlled diabetic range)  Date Value Ref Range Status  04/09/2020 8.7 (A) 0.0 - 7.0 % Final   Hgb A1c MFr Bld  Date Value Ref Range Status  07/23/2020 8.2 (H) 4.8 - 5.6 % Final    Comment:             Prediabetes: 5.7 - 6.4          Diabetes: >6.4          Glycemic control for adults with diabetes: <7.0           Failed - AA eGFR in normal range and within 360 days    GFR, Est African American  Date Value Ref Range Status  12/18/2014 >89 mL/min Final   GFR calc Af Amer  Date Value Ref Range Status  07/17/2020 53 (L) >59 mL/min/1.73 Final    Comment:    **In accordance with recommendations from the NKF-ASN Task force,**   [facility] is in the process of updating its eGFR calculation to the   2021 CKD-EPI creatinine equation that estimates kidney function   without a race variable.    GFR, Est Non African American  Date Value Ref Range Status  12/18/2014 >89 mL/min Final     Comment:      The estimated GFR is a calculation valid for adults (>=92 years old) that uses the CKD-EPI algorithm to adjust for age and sex. It is   not to be used for children, pregnant women, hospitalized patients,    patients on dialysis, or with rapidly changing kidney function. According to the NKDEP, eGFR >89 is normal, 60-89 shows mild impairment, 30-59 shows moderate impairment, 15-29 shows severe impairment and <15 is ESRD.      GFR calc non Af Amer  Date Value Ref Range Status  07/17/2020 46 (L) >59 mL/min/1.73 Final   GFR  Date Value Ref Range Status  09/08/2014 137.59 >60.00 mL/min Final          Passed - Valid encounter within last 6 months    Recent Outpatient Visits           4 months ago Type 2 diabetes mellitus with obesity ([place])  [place] Karle Plumber B, MD   8 months ago Type 2 diabetes mellitus with obesity [facility])   [place], Deborah B, MD   1 year ago Type 2 diabetes mellitus with diabetic polyneuropathy, without long-term current use of insulin [facility])   [place], Deborah B, MD   1 year ago COVID-19 virus infection   [place], Deborah B, MD   1 year ago COVID-19 virus infection   [place], Patrick E, MD       Future Appointments             In 5 months Garnet Sierras, DO Allergy and [place]

## 2020-12-11 NOTE — Patient Outreach (Signed)
Medicaid Managed Care   Nurse Care Manager Note  12/11/2020 Name:  [name] MRN:  [id number] DOB:  [birth year]  [name] is an 55 y.o. year old male who is a primary patient of [name], MD.  The [facility] Managed Care Coordination team was consulted for assistance with:    DMII  [name] was given information about Medicaid Managed Care Coordination team services today. [name] agreed to services and verbal consent obtained.  Engaged with patient by telephone for follow up visit in response to provider referral for case management and/or care coordination services.   Assessments/Interventions:  Review of past medical history, allergies, medications, health status, including review of consultants reports, laboratory and other test data, was performed as part of comprehensive evaluation and provision of chronic care management services.  SDOH (Social Determinants of Health) assessments and interventions performed: SDOH Interventions    Flowsheet Row Most Recent Value  SDOH Interventions   Food Insecurity Interventions Intervention Not Indicated  Housing Interventions Intervention Not Indicated       Care Plan  Allergies  Allergen Reactions   Bee Venom Anaphylaxis    Medications Reviewed Today     Reviewed by Garnet Sierras, DO (Physician) on 11/12/20 at 2209  Med List Status: <None>   Medication Order Taking? Sig Documenting Provider Last Dose Status Informant  ACCU-CHEK FASTCLIX LANCETS [place] [id number] Yes Use to check blood sugar 3 times daily. [name], MD Taking Active   aspirin 81 MG tablet [id number] Yes Take 81 mg by mouth daily. [provider] Taking Active Self  atorvastatin (LIPITOR) 10 MG tablet [id number] Yes Take 1 tablet by mouth once daily [name], MD Taking Active   carvedilol (COREG) 6.25 MG tablet [id number] Yes Take 1 tablet by mouth twice daily Lelon Perla, MD Taking Active   cetirizine (ZYRTEC) 10  MG tablet [id number]  Take 1 tablet (10 mg total) by mouth daily. Garnet Sierras, DO  Active   EPINEPHrine 0.3 mg/0.3 mL IJ SOAJ injection [id number] Yes Inject 0.3 mg into the muscle as needed. Garnet Sierras, DO  Active   famotidine (PEPCID) 20 MG tablet [id number]  Take 1 tablet (20 mg total) by mouth daily. Garnet Sierras, DO  Active   FARXIGA 5 MG TABS tablet [id number] Yes TAKE 1 TABLET BY MOUTH ONCE DAILY BEFORE BREAKFAST [name], MD Taking Active   fluticasone [facility]) 50 MCG/ACT nasal spray [id number] Yes Use 1-2 sprays in each nostril once a day as needed for nasal congestion. Garnet Sierras, DO Taking Active   furosemide (LASIX) 80 MG tablet [id number] Yes Take 1 tablet (80 mg total) by mouth 2 (two) times daily. Lelon Perla, MD Taking Active   gabapentin (NEURONTIN) 300 MG capsule [id number] No TAKE 1 CAPSULE BY MOUTH EVERY DAY AT BEDTIME  Patient not taking: Reported on 11/12/2020   [name], MD Not Taking Active   gabapentin (NEURONTIN) 300 MG capsule [id number] Yes Take 1 capsule (300 mg total) by mouth at bedtime. [name], MD Taking Active   glucose blood (ACCU-CHEK GUIDE) test strip [id number] Yes Use as instructed to check blood sugar 3 times daily. [name], MD Taking Active   Lancet Devices [facility]) lancets [id number] Yes 1 each by Other route 3 (three) times daily. Boykin Nearing, MD Taking Active Self  levalbuterol [facility] HFA) 45 MCG/ACT inhaler [id number] Yes Inhale 2 puffs into the lungs every 6 (  six) hours as needed for wheezing or shortness of breath. Garnet Sierras, DO Taking Active   methocarbamol (ROBAXIN) 500 MG tablet [id number] Yes Take 1 tablet (500 mg total) by mouth daily as needed for muscle spasms. [name], MD Taking Active   mometasone (ELOCON) 0.1 % cream [id number] Yes Apply 1 application topically daily. Marzetta Board, DPM Taking Active   potassium chloride (KLOR-CON) 10 MEQ tablet [id number] Yes Take 10 mEq by  mouth daily. [provider] Taking Active   sacubitril-valsartan (ENTRESTO) 24-26 MG [id number] Yes Take 1 tablet by mouth 2 (two) times daily. Lelon Perla, MD Taking Active   sitaGLIPtin (JANUVIA) 25 MG tablet [id number] Yes Take 1 tablet (25 mg total) by mouth daily. [name], MD Taking Active   tamsulosin [facility]) 0.4 MG CAPS capsule [id number] Yes Take 2 capsules by mouth once daily [name], MD Taking Active   triamcinolone cream (KENALOG) 0.1 % [id number] Yes Apply 1 application topically 2 (two) times daily as needed. Do not use on the face, neck, armpits or groin area. Do not use more than 3 weeks in a row. 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   Urticaria 05/03/2018   S/P inguinal hernia repair 03/01/2018   Unilateral inguinal hernia without obstruction or gangrene 07/24/2017   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   Hypersomnia 02/28/2016   Abnormal PFT 02/24/2016   Shortness of breath 01/28/2016   Diabetic polyneuropathy associated with type 2 diabetes mellitus ([place]) 12/18/2015   Lumbar back pain 12/18/2015   Controlled type 2 diabetes mellitus with complication, with long-term current use of insulin ([place]) 09/17/2015   Rash and other nonspecific skin eruption 05/14/2015   Left median nerve neuropathy 04/16/2015   Lesion of right median nerve at forearm 04/16/2015   ICD (implantable cardioverter-defibrillator) in place 35/46/5681   Chronic systolic congestive heart failure ([place]) 03/28/2014   Nonischemic cardiomyopathy ([place]) 11/09/2013   Syncope 10/20/2013   Chronic chest  pain 07/28/2013   RENAL CALCULUS 05/13/2009   Obesity 05/08/2009   Former heavy cigarette smoker (20-39 per day) 05/08/2009   HOARSENESS 05/08/2009   ANXIETY 02/28/2008   ASTHMATIC BRONCHITIS, ACUTE 02/28/2008   GERD 02/28/2008   IRRITABLE BOWEL SYNDROME 02/28/2008    Conditions to be addressed/monitored per PCP order:  DMII  Care Plan : Diabetes Type 2 (Adult)  Updates made by Melissa Montane, RN since 12/11/2020 12:00 AM     Problem: Glycemic Management (Diabetes, Type 2)      Long-Range Goal: Glycemic Management Optimized   Start Date: 06/14/2020  Expected End Date: 03/12/2021  Recent Progress: On track  Priority: High  Note:   CARE PLAN ENTRY Medicaid Managed Care (see longtitudinal plan of care for additional care plan information)  Objective:  Lab Results  Component Value Date   HGBA1C 8.2 (H) 07/23/2020   Lab Results  Component Value Date   CREATININE 1.66 (H) 07/17/2020   CREATININE 1.51 (H) 10/14/2019   CREATININE 2.75 (H) 10/13/2018   No results found for: EGFR Patient reported cbg findings: Checking BS at random times-100's  Current Barriers:  Knowledge Deficits related to basic Diabetes pathophysiology and self care/management-Mr Westry is checking his blood sugar randomly, he does not like to prick his finger due to the pain. He reports that his A1C was high because he has not been able to exercise like he was, due to a [place] the park. He plans to increase his exercise when he feels better. He reports eating a healthy diabetic diet. Update-Mr. Paver is exercising everyday, watching his diet, and taking his medications as directed. He does not have a follow up PCP appointment scheduled. Mr. Radney is needing some dental work and needs a referral. Case Manager Clinical Goal(s):  Over the next 60 days, patient will demonstrate improved adherence to prescribed treatment plan for diabetes self care/management as evidenced by:  daily monitoring and  recording of CBG adherence to ADA/ carb modified diet adherence to prescribed medication regimen Increase exercise as tolerated, working up to 5-7 days a week for 30 min Interventions:  Provided education to patient about basic DM disease process and diet Encouraged patient to call and schedule a follow up PCP appointment Collaborated with PCP regarding needed dental referral Discussed plans with patient for ongoing care management follow up and provided patient with direct contact information for care management team Reviewed scheduled/upcoming provider appointments Review of patient status, including review of consultants reports, relevant laboratory and other test results, and medications completed. Patient Self Care Activities:  adhere to a diabetic diet make and attend a follow up appointment with your PCP Self administers oral medications as prescribed Attends all scheduled provider appointments Adheres to prescribed ADA/carb modified set target A1C check blood sugar at prescribed times check blood sugar if I feel it is too high or too low enter blood sugar readings and medication or insulin into daily log take the blood sugar log to all doctor visits take the blood sugar meter to all doctor visits Please see past updates related to this goal by clicking on the "Past Updates" button in the selected goal RNCM will follow up with patient to assess new needs or barriers to managing his health on 03/12/21 @ 3:30pm      Follow Up:  Patient agrees to Care Plan and Follow-up.  Plan: The Managed Medicaid care management team will reach out to the patient again over the next 90 days.  Date/time of next scheduled RN care management/care coordination outreach:  03/12/21 @ 3:30pm  Lurena Joiner RN, [place] RN Care Coordinator

## 2020-12-11 NOTE — Patient Instructions (Signed)
Visit Information  Mr. Chapa was given information about Medicaid Managed Care team care coordination services as a part of their [place] Medicaid benefit. [name] verbally consented to engagement with the [facility] Managed Care team.   For questions related to your [facility], please call: [phone] or visit the homepage here: [email or web address]  If you would like to schedule transportation through your [facility], please call the following number at least 2 days in advance of your appointment: [phone].   Call the [place] at [phone], at any time, 24 hours a day, 7 days a week. If you are in danger or need immediate medical attention call 911.  If you would like help to quit smoking, call 1-800-QUIT-NOW 418-055-1838) OR Espaol: 1-855-Djelo-Ya (4-132-440-1027) o para ms informacin haga clic aqu or Text READY to 200-400 to register via text  [name] - following are the goals we discussed in your visit today:   Goals Addressed             This Visit's Progress    Monitor and Manage My Blood Sugar-Diabetes Type 2       Timeframe:  Short-Term Goal Priority:  High Start Date:  06/14/20                           Expected End Date:  03/12/21                     - adhere to a diabetic diet - make and attend a follow up appointment with your PCP - check blood sugar at prescribed times - check blood sugar if I feel it is too high or too low - enter blood sugar readings and medication or insulin into daily log - take the blood sugar log to all doctor visits - take the blood sugar meter to all doctor visits    Why is this important?   Checking your blood sugar at home helps to keep it from getting very high or very low.  Writing the results in a diary or log helps the doctor know how to care for you.  Your blood  sugar log should have the time, date and the results.  Also, write down the amount of insulin or other medicine that you take.  Other information, like what you ate, exercise done and how you were feeling, will also be helpful.           Set My Target A1C-Diabetes Type 2       Timeframe:  Long-Range Goal Priority:  Medium Start Date:  06/14/20                           Expected End Date:  03/12/21                     - set target A1C    Why is this important?   Your target A1C is decided together by you and your doctor.  It is based on several things like your age and other health issues.             Please see education materials related to diabetes provided by MyChart link.  Patient verbalizes understanding of instructions provided today.   Telephone follow up appointment with Managed Medicaid care management team member scheduled for:03/12/21 @ 3:30pm  [name] Joiner RN, BSN [place] RN Care Coordinator   Following is a copy of your plan of care:  Patient Care Plan: Diabetes Type 2 (Adult)     Problem Identified: Glycemic Management (Diabetes, Type 2)      Long-Range Goal: Glycemic Management Optimized   Start Date: 06/14/2020  Expected End Date: 03/12/2021  Recent Progress: On track  Priority: High  Note:   CARE PLAN ENTRY Medicaid Managed Care (see longtitudinal plan of care for additional care plan information)  Objective:  Lab Results  Component Value Date   HGBA1C 8.2 (H) 07/23/2020   Lab Results  Component Value Date   CREATININE 1.66 (H) 07/17/2020   CREATININE 1.51 (H) 10/14/2019   CREATININE 2.75 (H) 10/13/2018   No results found for: EGFR Patient reported cbg findings: Checking BS at random times-100's  Current Barriers:  Knowledge Deficits related to basic Diabetes pathophysiology and self care/management-Mr [name] is checking his blood sugar randomly, he does not like to prick his finger due to the pain. He  reports that his A1C was high because he has not been able to exercise like he was, due to [place] the [place]. He plans to increase his exercise when he feels better. He reports eating a healthy diabetic diet. Update-[name] is exercising everyday, watching his diet, and taking his medications as directed. He does not have a follow up PCP appointment scheduled. [name] is needing some dental work and needs a referral. Case Manager Clinical Goal(s):  Over the next 60 days, patient will demonstrate improved adherence to prescribed treatment plan for diabetes self care/management as evidenced by:  daily monitoring and recording of CBG adherence to ADA/ carb modified diet adherence to prescribed medication regimen Increase exercise as tolerated, working up to 5-7 days a week for 30 min Interventions:  Provided education to patient about basic DM disease process and diet Encouraged patient to call and schedule a follow up PCP appointment Collaborated with PCP regarding needed dental referral Discussed plans with patient for ongoing care management follow up and provided patient with direct contact information for care management team Reviewed scheduled/upcoming provider appointments Review of patient status, including review of consultants reports, relevant laboratory and other test results, and medications completed. Patient Self Care Activities:  adhere to a diabetic diet make and attend a follow up appointment with your PCP Self administers oral medications as prescribed Attends all scheduled provider appointments Adheres to prescribed ADA/carb modified set target A1C check blood sugar at prescribed times check blood sugar if I feel it is too high or too low enter blood sugar readings and medication or insulin into daily log take the blood sugar log to all doctor visits take the blood sugar meter to all doctor visits Please see past updates related to this goal by clicking on the  "Past Updates" button in the selected goal RNCM will follow up with patient to assess new needs or barriers to managing his health on 03/12/21 @ 3:30pm

## 2020-12-13 ENCOUNTER — Other Ambulatory Visit: Payer: Self-pay | Admitting: Internal Medicine

## 2020-12-13 ENCOUNTER — Encounter: Payer: Self-pay | Admitting: Internal Medicine

## 2020-12-13 DIAGNOSIS — N529 Male erectile dysfunction, unspecified: Secondary | ICD-10-CM

## 2020-12-13 NOTE — Progress Notes (Signed)
Remote ICD transmission.   

## 2020-12-14 ENCOUNTER — Encounter: Payer: Self-pay | Admitting: Internal Medicine

## 2020-12-14 ENCOUNTER — Telehealth: Payer: Self-pay | Admitting: Internal Medicine

## 2020-12-14 DIAGNOSIS — Z0120 Encounter for dental examination and cleaning without abnormal findings: Secondary | ICD-10-CM

## 2020-12-14 MED ORDER — SILDENAFIL CITRATE 100 MG PO TABS: ORAL_TABLET | ORAL | 4 refills | Status: DC

## 2020-12-14 NOTE — Telephone Encounter (Signed)
-----   Message from Melissa Montane, RN sent at 12/14/2020  2:52 PM EDT ----- Regarding: dental referral Hi Dr. Wynetta Emery, This patient is requesting a dental referral. Can you place a referral please? Thank you!  Lurena Joiner RN, BSN [place]  [employer] manager

## 2020-12-14 NOTE — Addendum Note (Signed)
Addended by: Karle Plumber B on: 12/14/2020 05:39 PM   Modules accepted: Orders

## 2020-12-20 ENCOUNTER — Other Ambulatory Visit: Payer: Self-pay

## 2020-12-20 NOTE — Patient Outreach (Signed)
Care Coordination  12/20/2020  [name] Feb 08, 1966 [id number]   Medicaid Managed Care   Unsuccessful Outreach Note  12/20/2020 Name: [name] MRN: [id number] DOB: [birth year]  Referred by: [name], MD Reason for referral : High Risk Managed Medicaid (MM Social work unsuccessful Telephone outreach)   An unsuccessful telephone outreach was attempted today. The patient was referred to the case management team for assistance with care management and care coordination.   Follow Up Plan: The care management team will reach out to the patient again over the next 30 days.   Mickel Fuchs, BSW, [place] Managed Medicaid Team  [phone]

## 2020-12-20 NOTE — Patient Instructions (Signed)
Visit Information  [name]  - as a part of your Medicaid benefit, you are eligible for care management and care coordination services at no cost or copay. I was unable to reach you by phone today but would be happy to help you with your health related needs. Please feel free to call me @ [facility] [place] number).   A member of the Managed Medicaid care management team will reach out to you again over the next 30 days.   [name], BSW, [place] Managed Medicaid Team  [phone]

## 2020-12-31 ENCOUNTER — Ambulatory Visit (INDEPENDENT_AMBULATORY_CARE_PROVIDER_SITE_OTHER): Payer: Medicaid Other

## 2020-12-31 DIAGNOSIS — Z95810 Presence of automatic (implantable) cardiac defibrillator: Secondary | ICD-10-CM

## 2020-12-31 DIAGNOSIS — I5022 Chronic systolic (congestive) heart failure: Secondary | ICD-10-CM

## 2021-01-01 NOTE — Progress Notes (Signed)
EPIC Encounter for ICM Monitoring  Patient Name: [name] is a 55 y.o. male Date: 01/01/2021 Primary Care Physican: [name], MD Primary Cardiologist: Stanford Breed Electrophysiologist: [place] 11/28/2020 Weight 230 lbs                                               Transmission reviewed.   [place] thoracic impedance suggesting normal fluid levels.   Prescribed dosage: Furosemide 80 mg take 1 tablet (80 mg total) twice a day.      Labs: 07/17/2020 Creatinine 1.66, BUN 13, Potassium 3.7, Sodium 142, GFR 46-53 Complete set of lab results in result review.    Recommendations:  No changes.   Follow-up plan: ICM clinic phone appointment on 02/04/2021.   91 day device clinic remote transmission 02/22/2021.     EP/Cardiology Office Visits: Recall 04/20/2021 with [name].  Message sent to EP scheduler 6/22 to contact patient for EP appointment (last visit 04/19/2018 with [name])   Copy of ICM check sent to Dr. Rayann Heman. .   3 month ICM trend: 12/28/2020.    [name], RN 01/01/2021 4:17 PM

## 2021-01-03 IMAGING — CR DG FOREARM 2V*R*
2 series · 2 of 2 positions shown · non-contrast
Comparison: None.

CLINICAL DATA: Pain

EXAM:
RIGHT FOREARM - 2 VIEW

[x forearm ap right]
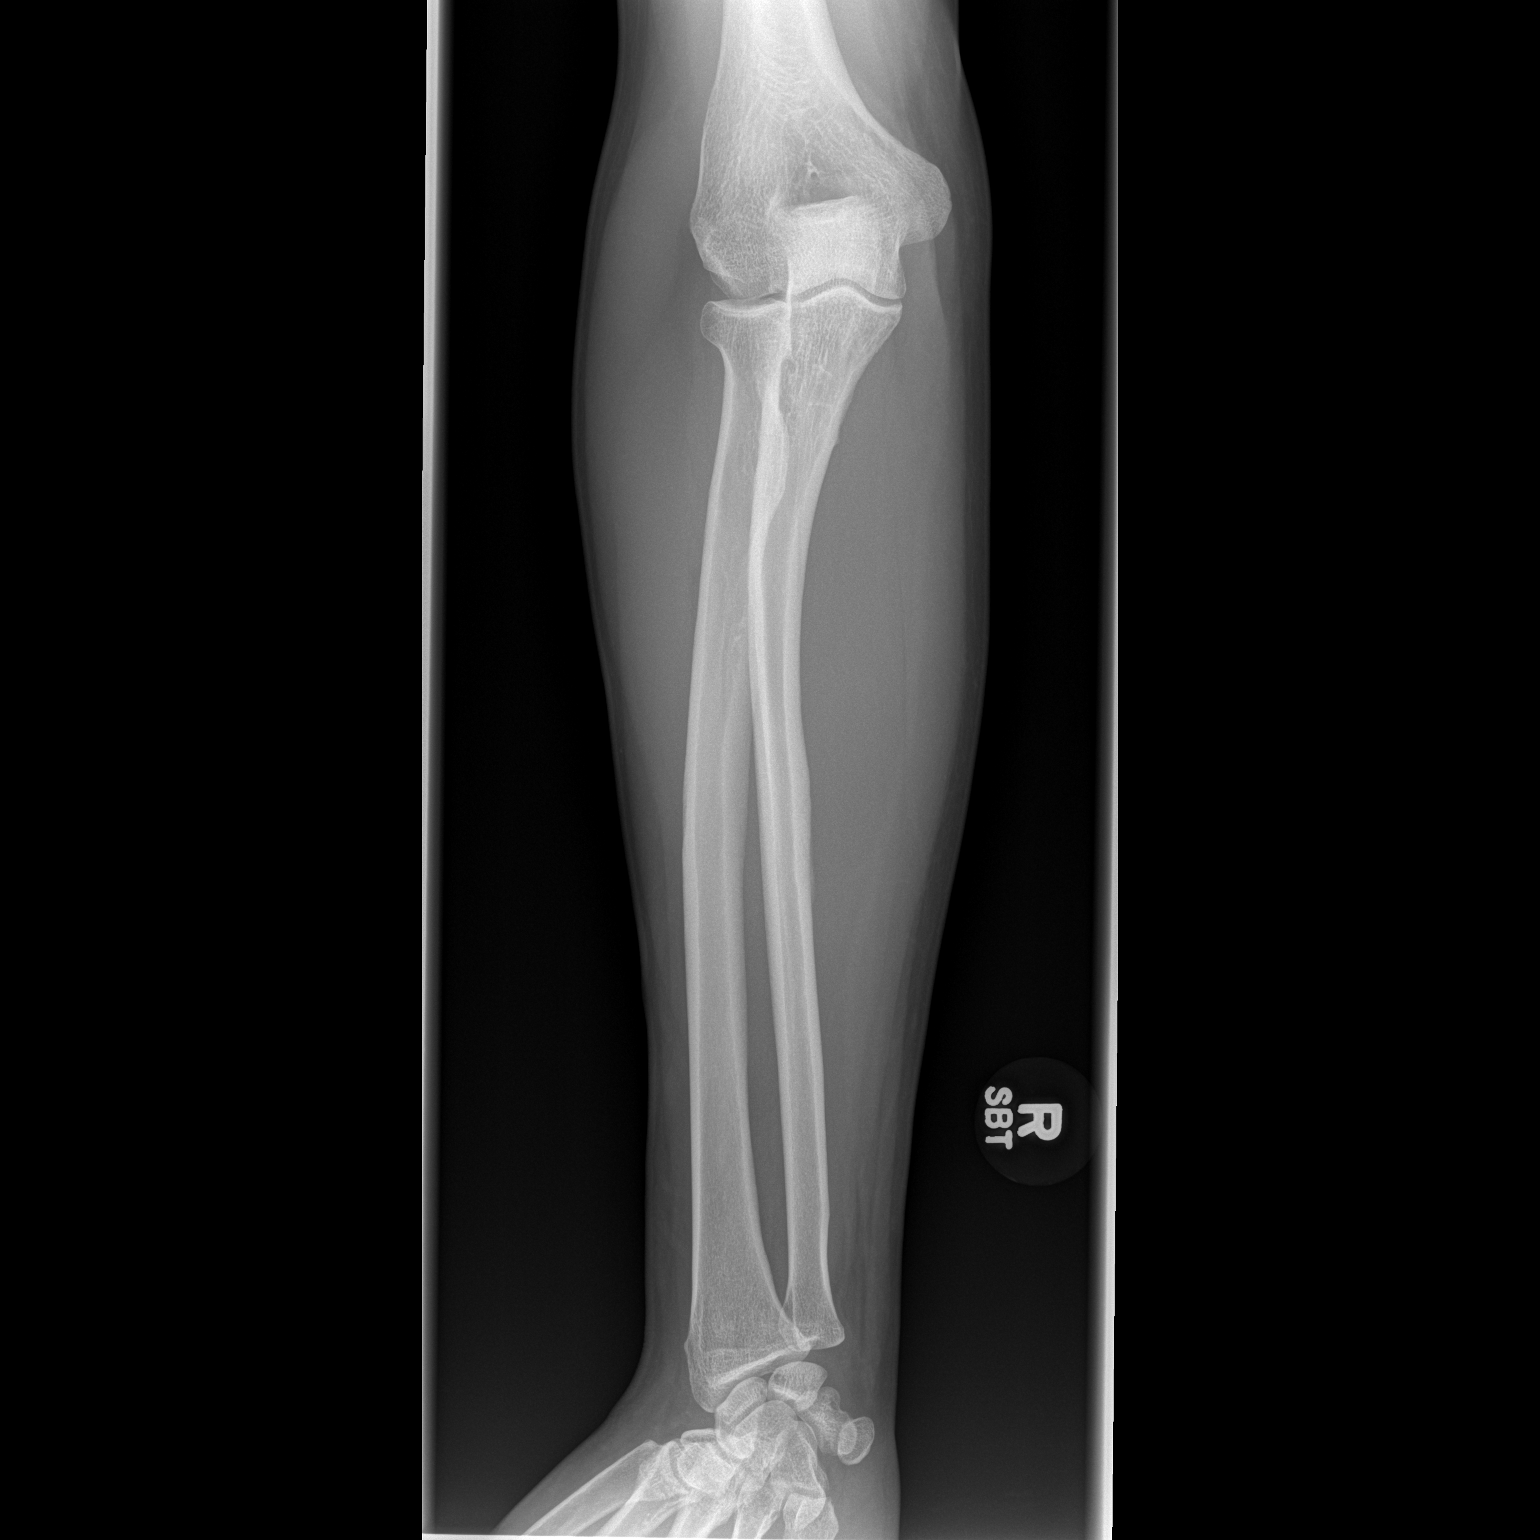

[x forearm lat right]
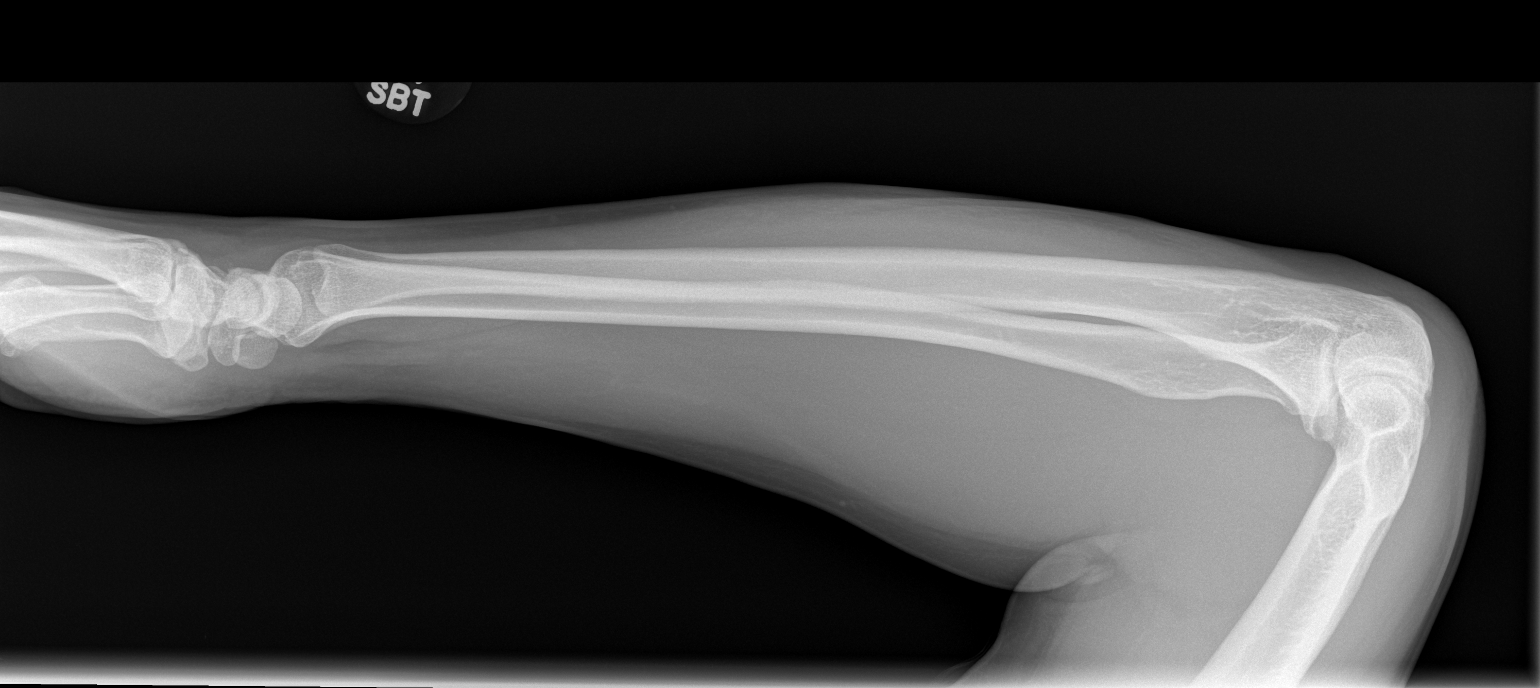

[2 of 2 positions shown; findings below may reference images not displayed]

FINDINGS: There is no evidence of fracture or other focal bone lesions. Soft
tissues are unremarkable.
IMPRESSION: Negative.

## 2021-01-14 ENCOUNTER — Ambulatory Visit (INDEPENDENT_AMBULATORY_CARE_PROVIDER_SITE_OTHER): Payer: Medicaid Other | Admitting: Internal Medicine

## 2021-01-14 ENCOUNTER — Other Ambulatory Visit: Payer: Self-pay

## 2021-01-14 VITALS — BP 116/64 | HR 65 | Ht 72.0 in | Wt 252.0 lb

## 2021-01-14 DIAGNOSIS — I471 Supraventricular tachycardia: Secondary | ICD-10-CM | POA: Diagnosis not present

## 2021-01-14 DIAGNOSIS — I428 Other cardiomyopathies: Secondary | ICD-10-CM

## 2021-01-14 DIAGNOSIS — I5022 Chronic systolic (congestive) heart failure: Secondary | ICD-10-CM

## 2021-01-14 DIAGNOSIS — R0789 Other chest pain: Secondary | ICD-10-CM | POA: Diagnosis not present

## 2021-01-14 NOTE — Patient Instructions (Addendum)
Medication Instructions:  Your physician recommends that you continue on your current medications as directed. Please refer to the Current Medication list given to you today.  Labwork: Pro BNP, BMP  Testing/Procedures: please schedule echo Your physician has requested that you have an echocardiogram. Echocardiography is a painless test that uses sound waves to create images of your heart. It provides your doctor with information about the size and shape of your heart and how well your heart's chambers and valves are working. This procedure takes approximately one hour. There are no restrictions for this procedure.   Follow-Up: Your physician wants you to follow-up in: 03/19/21 at 12 pm  [name] "[name]" [place], [place]   Remote monitoring is used to monitor your ICD from home. This monitoring reduces the number of office visits required to check your device to one time per year. It allows [place] to keep an eye on the functioning of your device to ensure it is working properly. You are scheduled for a device check from home on 02/22/21. You may send your transmission at any time that day. If you have a wireless device, the transmission will be sent automatically. After your physician reviews your transmission, you will receive a postcard with your next transmission date.  Any Other Special Instructions Will Be Listed Below (If Applicable).  If you need a refill on your cardiac medications before your next appointment, please call your pharmacy.

## 2021-01-14 NOTE — Progress Notes (Signed)
PCP: [name], MD Primary Cardiologist: Dr Stanford Breed Primary EP: [name]  [name] is a 55 y.o. male who presents today for routine electrophysiology followup.  Since last being seen in our clinic, the patient reports doing reasonably well.  He doesn't feel well most days.  + fatigue.  He has recently noticed more postural dizziness. Today, he denies symptoms of palpitations, chest pain, shortness of breath,  lower extremity edema,  presyncope, syncope, or ICD shocks.  The patient is otherwise without complaint today.   Past Medical History:  Diagnosis Date   AICD (automatic cardioverter/defibrillator) present    Chronic bronchitis ([place])    "get it most q yr" (12/23/2013)   Chronic systolic (congestive) heart failure ([place])    Fracture of left humerus    a. 07/2013.   GERD (gastroesophageal reflux disease)    not at present time   Heart murmur    "born w/it"    History of renal calculi    Hypertension    Kidney stones    NICM (nonischemic cardiomyopathy) ([place])    a. 07/2013 Echo: EF 20-25%, diff HK, Gr2 DD, mild MR, mod dil LA/RA. 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    Shockable heart rhythm detected by automated external defibrillator    Syncope    Type II diabetes mellitus ([place]) 2006   Past Surgical History:  Procedure Laterality Date   CARDIAC CATHETERIZATION  08/10/13   CHOLECYSTECTOMY  ~ 2010   IMPLANTABLE CARDIOVERTER DEFIBRILLATOR IMPLANT  12/23/2013   STJ Fortify ICD implanted by [name] for cardiomyopathy and syncope   IMPLANTABLE CARDIOVERTER DEFIBRILLATOR IMPLANT N/A 12/23/2013   Procedure: IMPLANTABLE CARDIOVERTER DEFIBRILLATOR IMPLANT;  Surgeon: Coralyn Mark, MD;  Location: [facility] CATH LAB;  Service: Cardiovascular;  Laterality: N/A;   INGUINAL HERNIA REPAIR Left 03/01/2018   Procedure: OPEN REPAIR OF LEFT INGUINAL HERNIA WITH MESH;  Surgeon: Greer Pickerel, MD;  Location: [facility]  ORS;  Service: General;  Laterality: Left;   LEFT HEART CATHETERIZATION WITH CORONARY ANGIOGRAM N/A 08/10/2013   Procedure: LEFT HEART CATHETERIZATION WITH CORONARY ANGIOGRAM;  Surgeon: Jettie Booze, MD;  Location: [facility] CATH LAB;  Service: Cardiovascular;  Laterality: N/A;   ORIF HUMERUS FRACTURE Left 08/12/2013   Procedure: OPEN REDUCTION INTERNAL FIXATION (ORIF) HUMERAL SHAFT FRACTURE;  Surgeon: Newt Minion, MD;  Location: [facility];  Service: Orthopedics;  Laterality: Left;  Open Reduction Internal Fixation Left Humerus   URETEROSCOPY     "laser for kidney stones"    ROS- all systems are reviewed and negative except as per HPI above  Current Outpatient Medications  Medication Sig Dispense Refill   ACCU-CHEK FASTCLIX LANCETS MISC Use to check blood sugar 3 times daily. 102 each 11   aspirin 81 MG tablet Take 81 mg by mouth daily.     atorvastatin (LIPITOR) 10 MG tablet Take 1 tablet by mouth once daily 90 tablet 0   carvedilol (COREG) 6.25 MG tablet Take 1 tablet by mouth twice daily 180 tablet 3   cetirizine (ZYRTEC) 10 MG tablet Take 1 tablet (10 mg total) by mouth daily. 90 tablet 2   dapagliflozin propanediol (FARXIGA) 5 MG TABS tablet Take 1 tablet (5 mg total) by mouth daily before breakfast. 30 tablet 0   EPINEPHrine 0.3 mg/0.3 mL IJ SOAJ injection Inject 0.3 mg into the muscle as needed. 1 each 2   famotidine (PEPCID) 20 MG  tablet Take 1 tablet (20 mg total) by mouth daily. 90 tablet 2   fluticasone (FLONASE) 50 MCG/ACT nasal spray Use 1-2 sprays in each nostril once a day as needed for nasal congestion. 16 g 5   furosemide (LASIX) 80 MG tablet Take 1 tablet (80 mg total) by mouth 2 (two) times daily. 180 tablet 3   gabapentin (NEURONTIN) 300 MG capsule TAKE 1 CAPSULE BY MOUTH EVERY DAY AT BEDTIME 90 capsule 1   gabapentin (NEURONTIN) 300 MG capsule Take 1 capsule (300 mg total) by mouth at bedtime. 90 capsule 3   glucose blood (ACCU-CHEK GUIDE) test strip Use as instructed to check  blood sugar 3 times daily. 100 each 11   Lancet Devices (ACCU-CHEK SOFTCLIX) lancets 1 each by Other route 3 (three) times daily. 1 each 0   levalbuterol (XOPENEX HFA) 45 MCG/ACT inhaler Inhale 2 puffs into the lungs every 6 (six) hours as needed for wheezing or shortness of breath. 15 g 1   methocarbamol (ROBAXIN) 500 MG tablet Take 1 tablet (500 mg total) by mouth daily as needed for muscle spasms. 90 tablet 1   mometasone (ELOCON) 0.1 % cream Apply 1 application topically daily. 45 g 1   potassium chloride (KLOR-CON) 10 MEQ tablet Take 10 mEq by mouth daily.     sacubitril-valsartan (ENTRESTO) 24-26 MG Take 1 tablet by mouth 2 (two) times daily. 60 tablet 5   sildenafil (VIAGRA) 100 MG tablet Take 1-2 tabs PO 1/2-1 hr prior to intercourse. 20 tablet 4   sitaGLIPtin (JANUVIA) 25 MG tablet Take 1 tablet (25 mg total) by mouth daily. 90 tablet 4   tamsulosin (FLOMAX) 0.4 MG CAPS capsule Take 2 capsules by mouth once daily 180 capsule 3   triamcinolone cream (KENALOG) 0.1 % Apply 1 application topically 2 (two) times daily as needed. Do not use on the face, neck, armpits or groin area. Do not use more than 3 weeks in a row. 30 g 0   No current facility-administered medications for this visit.    Physical Exam: Vitals:   01/14/21 1615  BP: 116/64  Pulse: 65  SpO2: 94%  Weight: 252 lb (114.3 kg)  Height: 6' (1.829 m)    GEN- The patient is well appearing, alert and oriented x 3 today.   Head- normocephalic, atraumatic Eyes-  Sclera clear, conjunctiva pink Ears- hearing intact Oropharynx- clear Lungs- Clear to ausculation bilaterally, normal work of breathing Chest- ICD pocket is well healed Heart- Regular rate and rhythm, no murmurs, rubs or gallops, PMI not laterally displaced GI- soft, NT, ND, + BS Extremities- no clubbing, cyanosis, or edema  ICD interrogation- reviewed in detail today,  See PACEART report  ekg tracing ordered today is personally reviewed and shows sinus rhythm,  QRS 114 msec  Wt Readings from Last 3 Encounters:  01/14/21 252 lb (114.3 kg)  11/12/20 243 lb 9.6 oz (110.5 kg)  10/22/20 246 lb 6.4 oz (111.8 kg)    Assessment and Plan:  1.  Chronic systolic dysfunction/ nonischemic CM euvolemic today He has some recently postural dizziness and thoracic impedance is low today.  I have advised heat avoidance and adequate hydration. EF previously 40-45%.  I will repeat echo.  He doesn't feel well with NYHA Class III CHF. We will check bmet and ProBNP today. Stable on an appropriate medical regimen Normal ICD function See Pace Art report No changes today he is not device dependant today followed in ICM device clinic Return to see EP APP  in 2 months (after echo) to consider for Anthem vs BAT therapy.  2. NSVT/ prior syncope No sustained arrhythmias Normal ICD function No change  3. HL Continue atorvastatin '10mg'$  daily  4. CRI, stage IIIb Limits medical therapy for CHF Follows with nephrology  Risks, benefits and potential toxicities for medications prescribed and/or refilled reviewed with patient today.   Return to see EP APP in 2-3 months to screen for Athem/ BAT therapy. Followup with EP APP annually thereafter.  Thompson Grayer MD, [facility] 01/14/2021 4:24 PM

## 2021-01-14 NOTE — Addendum Note (Signed)
Addended by: Darrell Jewel on: 01/14/2021 04:56 PM   Modules accepted: Orders

## 2021-01-15 LAB — BASIC METABOLIC PANEL
BUN/Creatinine Ratio: 6 — ABNORMAL LOW (ref 9–20)
BUN: 11 mg/dL (ref 6–24)
CO2: 25 mmol/L (ref 20–29)
Calcium: 10.1 mg/dL (ref 8.7–10.2)
Chloride: 102 mmol/L (ref 96–106)
Creatinine, Ser: 1.82 mg/dL — ABNORMAL HIGH (ref 0.76–1.27)
Glucose: 238 mg/dL — ABNORMAL HIGH (ref 65–99)
Potassium: 3.8 mmol/L (ref 3.5–5.2)
Sodium: 143 mmol/L (ref 134–144)
eGFR: 43 mL/min/{1.73_m2} — ABNORMAL LOW (ref >59)

## 2021-01-15 LAB — PRO B NATRIURETIC PEPTIDE: NT-Pro BNP: 144 pg/mL (ref 0–210)

## 2021-01-16 ENCOUNTER — Other Ambulatory Visit: Payer: Self-pay | Admitting: Internal Medicine

## 2021-01-16 ENCOUNTER — Encounter: Payer: Self-pay | Admitting: Internal Medicine

## 2021-01-16 DIAGNOSIS — N401 Enlarged prostate with lower urinary tract symptoms: Secondary | ICD-10-CM

## 2021-01-16 DIAGNOSIS — E1169 Type 2 diabetes mellitus with other specified complication: Secondary | ICD-10-CM

## 2021-01-16 DIAGNOSIS — N138 Other obstructive and reflux uropathy: Secondary | ICD-10-CM

## 2021-01-16 DIAGNOSIS — E785 Hyperlipidemia, unspecified: Secondary | ICD-10-CM

## 2021-01-16 MED ORDER — ATORVASTATIN CALCIUM 10 MG PO TABS: 10.0000 mg | ORAL_TABLET | Freq: Every day | ORAL | 1 refills | Status: DC

## 2021-01-16 MED ORDER — DAPAGLIFLOZIN PROPANEDIOL 5 MG PO TABS: 5.0000 mg | ORAL_TABLET | Freq: Every day | ORAL | 1 refills | Status: DC

## 2021-01-16 MED ORDER — TAMSULOSIN HCL 0.4 MG PO CAPS: 0.8000 mg | ORAL_CAPSULE | Freq: Every day | ORAL | 1 refills | Status: DC

## 2021-01-16 NOTE — Telephone Encounter (Signed)
Notes to clinic:  Patient is due for follow up appointment  Message sent for patient to contact office for appointment.  Patient was advised to contact office before refills run out on 12/11/2020   Requested Prescriptions  Pending Prescriptions Disp Refills   FARXIGA 5 MG TABS tablet [Pharmacy Med Name: Farxiga 5 MG Oral Tablet] 30 tablet 0    Sig: TAKE 1 TABLET BY MOUTH ONCE DAILY BEFORE BREAKFAST      Endocrinology:  Diabetes - SGLT2 Inhibitors Failed - 01/16/2021 10:51 AM      Failed - Cr in normal range and within 360 days    Creat  Date Value Ref Range Status  04/24/2016 1.68 (H) 0.70 - 1.33 mg/dL Final    Comment:      For patients > or = 56 years of age: The upper reference limit for Creatinine is approximately 13% higher for people identified as African-American.      Creatinine, Ser  Date Value Ref Range Status  01/14/2021 1.82 (H) 0.76 - 1.27 mg/dL Final          Failed - LDL in normal range and within 360 days    LDL Chol Calc (NIH)  Date Value Ref Range Status  10/14/2019 57 0 - 99 mg/dL Final          Failed - HBA1C is between 0 and 7.9 and within 180 days    HbA1c, POC (controlled diabetic range)  Date Value Ref Range Status  04/09/2020 8.7 (A) 0.0 - 7.0 % Final   Hgb A1c MFr Bld  Date Value Ref Range Status  07/23/2020 8.2 (H) 4.8 - 5.6 % Final    Comment:             Prediabetes: 5.7 - 6.4          Diabetes: >6.4          Glycemic control for adults with diabetes: <7.0           Failed - AA eGFR in normal range and within 360 days    GFR, Est African American  Date Value Ref Range Status  12/18/2014 >89 mL/min Final   GFR calc Af Amer  Date Value Ref Range Status  07/17/2020 53 (L) >59 mL/min/1.73 Final    Comment:    **In accordance with recommendations from the NKF-ASN Task force,**   [facility] is in the process of updating its eGFR calculation to the   2021 CKD-EPI creatinine equation that estimates kidney function   without a race  variable.    GFR, Est Non African American  Date Value Ref Range Status  12/18/2014 >89 mL/min Final    Comment:      The estimated GFR is a calculation valid for adults (>=55 years old) that uses the CKD-EPI algorithm to adjust for age and sex. It is   not to be used for children, pregnant women, hospitalized patients,    patients on dialysis, or with rapidly changing kidney function. According to the NKDEP, eGFR >89 is normal, 60-89 shows mild impairment, 30-59 shows moderate impairment, 15-29 shows severe impairment and <15 is ESRD.      GFR calc non Af Amer  Date Value Ref Range Status  07/17/2020 46 (L) >59 mL/min/1.73 Final   GFR  Date Value Ref Range Status  09/08/2014 137.59 >60.00 mL/min Final   eGFR  Date Value Ref Range Status  01/14/2021 43 (L) >59 mL/min/1.73 Final  Passed - Valid encounter within last 6 months    Recent Outpatient Visits           5 months ago Type 2 diabetes mellitus with obesity ([place])   [place] [name], MD   9 months ago Type 2 diabetes mellitus with obesity [facility])   [place], Deborah B, MD   1 year ago Type 2 diabetes mellitus with diabetic polyneuropathy, without long-term current use of insulin ([place])   [place], Deborah B, MD   1 year ago COVID-19 virus infection   [place], Deborah B, MD   1 year ago COVID-19 virus infection   [place], MD       Future Appointments             In 3 months Garnet Sierras, DO [facility] and [place]               atorvastatin (LIPITOR) 10 MG tablet [Pharmacy Med Name: Atorvastatin Calcium 10 MG Oral Tablet] 90 tablet 0    Sig: Take 1 tablet by mouth once daily      Cardiovascular:  Antilipid - Statins Failed - 01/16/2021 10:51 AM      Failed - Total  Cholesterol in normal range and within 360 days    Cholesterol, Total  Date Value Ref Range Status  10/14/2019 115 100 - 199 mg/dL Final          Failed - LDL in normal range and within 360 days    LDL Chol Calc (NIH)  Date Value Ref Range Status  10/14/2019 57 0 - 99 mg/dL Final          Failed - HDL in normal range and within 360 days    HDL  Date Value Ref Range Status  10/14/2019 44 >39 mg/dL Final          Failed - Triglycerides in normal range and within 360 days    Triglycerides  Date Value Ref Range Status  10/14/2019 66 0 - 149 mg/dL Final          Passed - Patient is not pregnant      Passed - Valid encounter within last 12 months    Recent Outpatient Visits           5 months ago Type 2 diabetes mellitus with obesity ([place])   [place] Karle Plumber B, MD   9 months ago Type 2 diabetes mellitus with obesity [facility])   [place], Deborah B, MD   1 year ago Type 2 diabetes mellitus with diabetic polyneuropathy, without long-term current use of insulin ([place])   [place], Deborah B, MD   1 year ago COVID-19 virus infection   [place], Deborah B, MD   1 year ago COVID-19 virus infection   [place], MD       Future Appointments             In 3 months Garnet Sierras, DO [facility]               tamsulosin (FLOMAX) 0.4 MG CAPS capsule [Pharmacy Med Name: Tamsulosin HCl 0.4 MG Oral Capsule]  180 capsule 0    Sig: Take 2 capsules by mouth once daily      Urology: Alpha-Adrenergic Blocker Passed - 01/16/2021 10:51 AM      Passed - Last BP in normal range    BP Readings from Last 1 Encounters:  01/14/21 116/64          Passed - Valid encounter within last 12 months    Recent Outpatient Visits           5 months ago Type 2  diabetes mellitus with obesity ([place])   [place] Karle Plumber B, MD   9 months ago Type 2 diabetes mellitus with obesity [facility])   [place], Deborah B, MD   1 year ago Type 2 diabetes mellitus with diabetic polyneuropathy, without long-term current use of insulin ([place])   [place], Deborah B, MD   1 year ago COVID-19 virus infection   [place], Deborah B, MD   1 year ago COVID-19 virus infection   [place], Patrick E, MD       Future Appointments             In 3 months Garnet Sierras, DO Allergy and [place]

## 2021-01-31 ENCOUNTER — Ambulatory Visit: Payer: Medicaid Other | Admitting: Podiatry

## 2021-01-31 ENCOUNTER — Encounter: Payer: Self-pay | Admitting: Internal Medicine

## 2021-02-01 ENCOUNTER — Ambulatory Visit (HOSPITAL_COMMUNITY): Payer: Medicaid Other | Attending: Internal Medicine

## 2021-02-01 ENCOUNTER — Other Ambulatory Visit: Payer: Self-pay

## 2021-02-01 DIAGNOSIS — I5022 Chronic systolic (congestive) heart failure: Secondary | ICD-10-CM | POA: Insufficient documentation

## 2021-02-01 DIAGNOSIS — I428 Other cardiomyopathies: Secondary | ICD-10-CM | POA: Insufficient documentation

## 2021-02-01 DIAGNOSIS — I471 Supraventricular tachycardia: Secondary | ICD-10-CM | POA: Diagnosis not present

## 2021-02-01 DIAGNOSIS — R0789 Other chest pain: Secondary | ICD-10-CM | POA: Diagnosis not present

## 2021-02-01 LAB — ECHOCARDIOGRAM COMPLETE
Area-P 1/2: 2.61 cm2
S' Lateral: 5 cm

## 2021-02-04 ENCOUNTER — Other Ambulatory Visit: Payer: Self-pay

## 2021-02-04 ENCOUNTER — Ambulatory Visit (INDEPENDENT_AMBULATORY_CARE_PROVIDER_SITE_OTHER): Payer: Medicaid Other

## 2021-02-04 ENCOUNTER — Ambulatory Visit: Payer: Medicaid Other | Admitting: Podiatry

## 2021-02-04 DIAGNOSIS — I5022 Chronic systolic (congestive) heart failure: Secondary | ICD-10-CM

## 2021-02-04 DIAGNOSIS — Z95810 Presence of automatic (implantable) cardiac defibrillator: Secondary | ICD-10-CM

## 2021-02-04 DIAGNOSIS — M722 Plantar fascial fibromatosis: Secondary | ICD-10-CM | POA: Diagnosis not present

## 2021-02-04 DIAGNOSIS — M778 Other enthesopathies, not elsewhere classified: Secondary | ICD-10-CM

## 2021-02-04 NOTE — Patient Instructions (Signed)

## 2021-02-06 NOTE — Progress Notes (Signed)
  Subjective:  Patient ID: [name], male    DOB: 1965/08/22,  MRN: [id number]  Chief Complaint  Patient presents with   Foot Pain     heel left foot hurts and right foot is also in pain    55 y.o. male presents with the above complaint. History confirmed with patient.  Pains been going on for about a month is worse when he is standing on it.  He also describes right foot pain and mild swelling in the midfoot has been gone for about 2 years but new balance shoes have helped this quite a bit  Objective:  Physical Exam: warm, good capillary refill, no trophic changes or ulcerative lesions, normal DP and PT pulses, and normal sensory exam. Left Foot: point tenderness over the heel pad and point tenderness of the mid plantar fascia Right Foot:  Mild edema over dorsal lateral forefoot minimal tenderness to palpation  No images are attached to the encounter.  Radiographs: Multiple views x-ray of both feet: On left foot he has a plantar calcaneal spur and on the right foot he has very mild midtarsal arthritis Assessment:   1. Plantar fasciitis of left foot   2. Capsulitis of right foot      Plan:  Patient was evaluated and treated and all questions answered.  Is midtarsal arthritis is mild and I recommend we continue to monitor and wear supportive shoe gear for this and treat symptomatically if it begins to worsen.  Discussed the etiology and treatment options for plantar fasciitis including stretching, formal physical therapy, supportive shoegears such as a running shoe or sneaker, pre fabricated orthoses, injection therapy, and oral medications. We also discussed the role of surgical treatment of this for patients who do not improve after exhausting non-surgical treatment options.   -XR reviewed with patient -Educated patient on stretching and icing of the affected limb -Injection delivered to the plantar fascia of the left foot.  After sterile prep with povidone-iodine  solution and alcohol, the left heel was injected with 0.5cc 2% xylocaine plain, 0.5cc 0.5% marcaine plain, '5mg'$  triamcinolone acetonide, and '2mg'$  dexamethasone was injected along the medial plantar fascia at the insertion on the plantar calcaneus. The patient tolerated the procedure well without complication.  Return in about 6 weeks (around 03/18/2021) for recheck plantar fasciitis.

## 2021-02-08 NOTE — Progress Notes (Signed)
EPIC Encounter for ICM Monitoring  Patient Name: [name] is a 55 y.o. male Date: 02/08/2021 Primary Care Physican: [name], MD Primary Cardiologist: Stanford Breed Electrophysiologist: Allred 02/08/2021 Weight 230 lbs                                               Spoke with patient and heart failure questions reviewed.  Pt asymptomatic for fluid accumulation and feeling well.  His fluid intake has been varying which may be contributing to fluid accumulation and dryness episodes.    [place]vue thoracic impedance suggesting normal fluid levels.   Prescribed dosage:  Furosemide 80 mg take 1 tablet (80 mg total) twice a day.    Potassium 10 mEq take 1 tablet daily.    Labs: 01/14/2021 Creatinine 1.82, BUN 11, Potassium 3.8, Sodium 143, GFR 43 07/17/2020 Creatinine 1.66, BUN 13, Potassium 3.7, Sodium 142, GFR 46-53 Complete set of lab results in result review.    Recommendations:  Recommendation to drink approximately 64 oz fluid daily to help maintain fluid level balance.  Encouraged to call if experiencing any fluid symptoms.    Follow-up plan: ICM clinic phone appointment on 03/18/2021.   91 day device clinic remote transmission 02/22/2021.     EP/Cardiology Office Visits: Recall 04/20/2021 with [name].  03/19/2021 with Oda Kilts, PA   Copy of ICM check sent to Dr. Rayann Heman.   3 month ICM trend: 02/05/2021.    1 Year ICM trend:       [name], RN 02/08/2021 8:08 AM

## 2021-02-14 ENCOUNTER — Other Ambulatory Visit: Payer: Self-pay | Admitting: Allergy

## 2021-02-14 ENCOUNTER — Encounter: Payer: Self-pay | Admitting: Allergy

## 2021-02-18 ENCOUNTER — Ambulatory Visit: Payer: Medicaid Other | Admitting: Podiatry

## 2021-02-22 ENCOUNTER — Ambulatory Visit (INDEPENDENT_AMBULATORY_CARE_PROVIDER_SITE_OTHER): Payer: Medicaid Other

## 2021-02-22 DIAGNOSIS — I428 Other cardiomyopathies: Secondary | ICD-10-CM

## 2021-02-25 LAB — CUP PACEART REMOTE DEVICE CHECK
Battery Remaining Longevity: 44 mo
Battery Remaining Percentage: 43 %
Battery Voltage: 2.92 V
Brady Statistic RV Percent Paced: 1 %
Date Time Interrogation Session: 20220917015035
HighPow Impedance: 69 Ohm
HighPow Impedance: 69 Ohm
Implantable Lead Implant Date: 20150717
Implantable Lead Location: 753860
Implantable Pulse Generator Implant Date: 20150717
Lead Channel Impedance Value: 310 Ohm
Lead Channel Pacing Threshold Amplitude: 1 V
Lead Channel Pacing Threshold Pulse Width: 0.5 ms
Lead Channel Sensing Intrinsic Amplitude: 11.8 mV
Lead Channel Setting Pacing Amplitude: 2.5 V
Lead Channel Setting Pacing Pulse Width: 0.5 ms
Lead Channel Setting Sensing Sensitivity: 0.5 mV
Pulse Gen Serial Number: 7196009

## 2021-02-28 NOTE — Progress Notes (Signed)
Remote ICD transmission.   

## 2021-03-12 ENCOUNTER — Other Ambulatory Visit: Payer: Self-pay

## 2021-03-12 ENCOUNTER — Other Ambulatory Visit: Payer: Self-pay | Admitting: *Deleted

## 2021-03-12 NOTE — Patient Instructions (Signed)
Visit Information  Mr. Bais was given information about Medicaid Managed Care team care coordination services as a part of their [place] Medicaid benefit. TIMOTHY TRUDELL verbally consented to engagement with the [facility] Managed Care team.   If you are experiencing a medical emergency, please call 911 or report to your local emergency department or urgent care.   If you have a non-emergency medical problem during routine business hours, please contact your provider's office and ask to speak with a nurse.   For questions related to your [facility], please call: [phone] or visit the homepage here: [email or web address]  If you would like to schedule transportation through your [facility], please call the following number at least 2 days in advance of your appointment: [phone].   Call the [place] at [phone], at any time, 24 hours a day, 7 days a week. If you are in danger or need immediate medical attention call 911.  If you would like help to quit smoking, call 1-800-QUIT-NOW 541-282-6802) OR Espaol: 1-855-Djelo-Ya (1-027-253-6644) o para ms informacin haga clic aqu or Text READY to 200-400 to register via text  Mr. Stille - following are the goals we discussed in your visit today:   Goals Addressed             This Visit's Progress    Monitor and Manage My Blood Sugar-Diabetes Type 2       Timeframe:  Short-Term Goal Priority:  High Start Date:  06/14/20                           Expected End Date:  04/11/21   Follow up 04/11/21                   - adhere to a diabetic diet - make and attend a follow up appointment with your PCP-03/19/21 - check blood sugar at prescribed times-check 3 times a day - check blood sugar if I feel it is too high or too low - enter blood sugar readings and medication or insulin into  daily log - take the blood sugar log to all doctor visits - take the blood sugar meter to all doctor visits    Why is this important?   Checking your blood sugar at home helps to keep it from getting very high or very low.  Writing the results in a diary or log helps the doctor know how to care for you.  Your blood sugar log should have the time, date and the results.  Also, write down the amount of insulin or other medicine that you take.  Other information, like what you ate, exercise done and how you were feeling, will also be helpful.          Set My Target A1C-Diabetes Type 2       Timeframe:  Long-Range Goal Priority:  Medium Start Date:  06/14/20                           Expected End Date:  04/11/21     Follow up 04/11/21                 - set target A1C    Why is this important?   Your target A1C is decided together by you and your doctor.  It is based on several things  like your age and other health issues.            Please see education materials related to diabetic and heart healthy diet provided by MyChart link.  Patient has access to MyChart and can view provided education  Telephone follow up appointment with Managed Medicaid care management team member scheduled for:04/11/21 @ 3:45pm  Lurena Joiner RN, BSN [facility] RN Care Coordinator   Following is a copy of your plan of care:  Patient Care Plan: Diabetes Type 2 (Adult)     Problem Identified: Glycemic Management (Diabetes, Type 2)      Long-Range Goal: Glycemic Management Optimized   Start Date: 06/14/2020  Expected End Date: 04/11/2021  Recent Progress: On track  Priority: High  Note:   CARE PLAN ENTRY Medicaid Managed Care (see longtitudinal plan of care for additional care plan information)  Objective:  Lab Results  Component Value Date   HGBA1C 8.2 (H) 07/23/2020   Lab Results  Component Value Date   CREATININE 1.82 (H) 01/14/2021   CREATININE 1.66 (H)  07/17/2020   CREATININE 1.51 (H) 10/14/2019   Lab Results  Component Value Date   EGFR 43 (L) 01/14/2021   Patient reported cbg findings: Checking BS at random times-100's  Current Barriers:  Knowledge Deficits related to basic Diabetes pathophysiology and self care/management-Mr Cygan is checking his blood sugar randomly, he does not like to prick his finger due to the pain. He reports that his A1C was high because he has not been able to exercise like he was, due to a recent [place] in the park. He plans to increase his exercise when he feels better. He reports eating a healthy diabetic diet. Mr. Roskos is exercising everyday, watching his diet, and taking his medications as directed. He does not have a follow up PCP appointment scheduled. Mr. Nyman is needing some dental work and needs a Financial risk analyst. Boze has not found a dental provider accepting his insurance. He reports worsening heart failure, recent ejection fraction 30%. PCP and Cardiology follow up scheduled for next week, he is aware. Case Manager Clinical Goal(s):  Over the next 60 days, patient will demonstrate improved adherence to prescribed treatment plan for diabetes self care/management as evidenced by:  daily monitoring and recording of CBG adherence to ADA/ carb modified diet adherence to prescribed medication regimen Increase exercise as tolerated, working up to 5-7 days a week for 30 min Interventions:  Provided education on diabetic and heart healthy diet to patient Advised patient to exercise 30 min a day for 5 days a week Discussed plans with patient for ongoing care management follow up and provided patient with direct contact information for care management team Reviewed scheduled/upcoming provider appointments, patient has transportation to these appointments Review of patient status, including review of consultants reports, relevant laboratory and other test results, and medications completed. Assisted with  finding dental provider accepting Medicaid, call made to [place] [phone] Patient Self Care Activities:  - adhere to a diabetic diet - make and attend a follow up appointment with your PCP-03/19/21 - check blood sugar at prescribed times-check 3 times a day - check blood sugar if I feel it is too high or too low - enter blood sugar readings and medication or insulin into daily log - take the blood sugar log to all doctor visits - take the blood sugar meter to all doctor visits Please see past updates related to this goal by clicking on the "  Past Updates" button in the selected goal RNCM will follow up with patient to assess new needs or barriers to managing his health on 04/11/21 @ 3:45pm

## 2021-03-12 NOTE — Patient Outreach (Signed)
Medicaid Managed Care   Nurse Care Manager Note  03/12/2021 Name:  [name] MRN:  [id number] DOB:  11-10-1965  [name] is an 55 y.o. year old male who is a primary patient of [name], MD.  The [facility] Managed Care Coordination team was consulted for assistance with:    CHF DMII  [name] was given information about Medicaid Managed Care Coordination team services today. [name] Patient agreed to services and verbal consent obtained.  Engaged with patient by telephone for follow up visit in response to provider referral for case management and/or care coordination services.   Assessments/Interventions:  Review of past medical history, allergies, medications, health status, including review of consultants reports, laboratory and other test data, was performed as part of comprehensive evaluation and provision of chronic care management services.  SDOH (Social Determinants of Health) assessments and interventions performed: SDOH Interventions    Flowsheet Row Most Recent Value  SDOH Interventions   Social Connections Interventions Intervention Not Indicated  Transportation Interventions Intervention Not Indicated       Care Plan  Allergies  Allergen Reactions   Bee Venom Anaphylaxis    Medications Reviewed Today     Reviewed by Melissa Montane, RN (Registered Nurse) on 03/12/21 at 1542  Med List Status: <None>   Medication Order Taking? Sig Documenting Provider Last Dose Status Informant  ACCU-CHEK FASTCLIX LANCETS [place] [id number] Yes Use to check blood sugar 3 times daily. [name], MD Taking Active   aspirin 81 MG tablet [id number] Yes Take 81 mg by mouth daily. [provider] Taking Active Self  atorvastatin (LIPITOR) 10 MG tablet [id number] Yes Take 1 tablet (10 mg total) by mouth daily. [name], MD Taking Active   carvedilol (COREG) 6.25 MG tablet [id number] Yes Take 1 tablet by mouth twice daily Lelon Perla,  MD Taking Active   cetirizine (ZYRTEC) 10 MG tablet [id number] Yes Take 1 tablet by mouth once daily Garnet Sierras, DO Taking Active   dapagliflozin propanediol (FARXIGA) 5 MG TABS tablet [id number] Yes Take 1 tablet (5 mg total) by mouth daily before breakfast. [name], MD Taking Active   EPINEPHrine 0.3 mg/0.3 mL IJ SOAJ injection [id number] Yes Inject 0.3 mg into the muscle as needed. Garnet Sierras, DO Taking Active   famotidine (PEPCID) 20 MG tablet [id number] Yes Take 1 tablet (20 mg total) by mouth daily. Garnet Sierras, DO Taking Active   fluticasone [facility]) 50 MCG/ACT nasal spray [id number] Yes Use 1-2 sprays in each nostril once a day as needed for nasal congestion. Garnet Sierras, DO Taking Active   furosemide (LASIX) 80 MG tablet [id number] Yes Take 1 tablet (80 mg total) by mouth 2 (two) times daily. Lelon Perla, MD Taking Active   gabapentin (NEURONTIN) 300 MG capsule [id number]  TAKE 1 CAPSULE BY MOUTH EVERY DAY AT BEDTIME [name], MD  Active   gabapentin (NEURONTIN) 300 MG capsule [id number] Yes Take 1 capsule (300 mg total) by mouth at bedtime. [name], MD Taking Active   glucose blood (ACCU-CHEK GUIDE) test strip [id number] Yes Use as instructed to check blood sugar 3 times daily. [name], MD Taking Active   Lancet Devices [facility]) lancets [id number] Yes 1 each by Other route 3 (three) times daily. Boykin Nearing, MD Taking Active Self  levalbuterol [facility] HFA) 45 MCG/ACT inhaler [id number] Yes Inhale 2 puffs into the lungs every 6 (six) hours as  needed for wheezing or shortness of breath. Garnet Sierras, DO Taking Active   methocarbamol (ROBAXIN) 500 MG tablet [id number] Yes Take 1 tablet (500 mg total) by mouth daily as needed for muscle spasms. [name], MD Taking Active   mometasone (ELOCON) 0.1 % cream [id number] Yes Apply 1 application topically daily. Marzetta Board, DPM Taking Active   potassium chloride (KLOR-CON) 10 MEQ  tablet [id number] Yes Take 10 mEq by mouth daily. [provider] Taking Active   sacubitril-valsartan (ENTRESTO) 24-26 MG [id number] Yes Take 1 tablet by mouth 2 (two) times daily. Lelon Perla, MD Taking Active   sildenafil (VIAGRA) 100 MG tablet [id number] Yes Take 1-2 tabs PO 1/2-1 hr prior to intercourse. [name], MD Taking Active   sitaGLIPtin (JANUVIA) 25 MG tablet [id number] Yes Take 1 tablet (25 mg total) by mouth daily. [name], MD Taking Active   tamsulosin [facility]) 0.4 MG CAPS capsule [id number] Yes Take 2 capsules (0.8 mg total) by mouth daily. [name], MD Taking Active   triamcinolone cream (KENALOG) 0.1 % [id number] Yes Apply 1 application topically 2 (two) times daily as needed. Do not use on the face, neck, armpits or groin area. Do not use more than 3 weeks in a row. Garnet Sierras, DO Taking Active             Patient Active Problem List   Diagnosis Date Noted   CKD (chronic kidney disease) stage 2, GFR 60-89 ml/min 10/24/2019   Acute non-recurrent frontal sinusitis 06/27/2019   COVID-19 virus infection 06/27/2019   Glaucoma suspect 12/22/2018   Seasonal and perennial allergic rhinoconjunctivitis 10/25/2018   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   Urticaria 05/03/2018   S/P inguinal hernia repair 03/01/2018   Unilateral inguinal hernia without obstruction or gangrene 07/24/2017   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   Hypersomnia 02/28/2016   Abnormal PFT 02/24/2016   Shortness of breath 01/28/2016   Diabetic polyneuropathy associated with type 2 diabetes mellitus ([place]) 12/18/2015   Lumbar back pain 12/18/2015   Controlled type 2 diabetes mellitus with complication, with long-term current use of insulin ([place]) 09/17/2015   Rash and other nonspecific skin eruption 05/14/2015   Left median nerve neuropathy 04/16/2015   Lesion of right median nerve at forearm 04/16/2015   ICD (implantable  cardioverter-defibrillator) in place 74/05/8785   Chronic systolic congestive heart failure ([place]) 03/28/2014   Nonischemic cardiomyopathy ([place]) 11/09/2013   Syncope 10/20/2013   Chronic chest pain 07/28/2013   RENAL CALCULUS 05/13/2009   Obesity 05/08/2009   Former heavy cigarette smoker (20-39 per day) 05/08/2009   HOARSENESS 05/08/2009   ANXIETY 02/28/2008   ASTHMATIC BRONCHITIS, ACUTE 02/28/2008   GERD 02/28/2008   IRRITABLE BOWEL SYNDROME 02/28/2008    Conditions to be addressed/monitored per PCP order:  CHF and DMII  Care Plan : Diabetes Type 2 (Adult)  Updates made by Melissa Montane, RN since 03/12/2021 12:00 AM     Problem: Glycemic Management (Diabetes, Type 2)      Long-Range Goal: Glycemic Management Optimized   Start Date: 06/14/2020  Expected End Date: 04/11/2021  Recent Progress: On track  Priority: High  Note:   CARE PLAN ENTRY Medicaid Managed Care (see longtitudinal plan of care for additional care plan information)  Objective:  Lab Results  Component Value Date   HGBA1C 8.2 (H) 07/23/2020   Lab Results  Component Value Date   CREATININE 1.82 (H) 01/14/2021   CREATININE  1.66 (H) 07/17/2020   CREATININE 1.51 (H) 10/14/2019   Lab Results  Component Value Date   EGFR 43 (L) 01/14/2021   Patient reported cbg findings: Checking BS at random times-100's  Current Barriers:  Knowledge Deficits related to basic Diabetes pathophysiology and self care/management-Mr Wiltrout is checking his blood sugar randomly, he does not like to prick his finger due to the pain. He reports that his A1C was high because he has not been able to exercise like he was, due to a recent [place] the park. He plans to increase his exercise when he feels better. He reports eating a healthy diabetic diet. Mr. Alcala is exercising everyday, watching his diet, and taking his medications as directed. He does not have a follow up PCP appointment scheduled. Mr. Grose is needing some dental work  and needs a Financial risk analyst. [place] has not found a dental provider accepting his insurance. He reports worsening heart failure, recent ejection fraction 30%. PCP and Cardiology follow up scheduled for next week, he is aware. Case Manager Clinical Goal(s):  Over the next 60 days, patient will demonstrate improved adherence to prescribed treatment plan for diabetes self care/management as evidenced by:  daily monitoring and recording of CBG adherence to ADA/ carb modified diet adherence to prescribed medication regimen Increase exercise as tolerated, working up to 5-7 days a week for 30 min Interventions:  Provided education on diabetic and heart healthy diet to patient Advised patient to exercise 30 min a day for 5 days a week Discussed plans with patient for ongoing care management follow up and provided patient with direct contact information for care management team Reviewed scheduled/upcoming provider appointments, patient has transportation to these appointments Review of patient status, including review of consultants reports, relevant laboratory and other test results, and medications completed. Assisted with finding dental provider accepting Medicaid, call made to [place] [phone] Patient Self Care Activities:  - adhere to a diabetic diet - make and attend a follow up appointment with your PCP-03/19/21 - check blood sugar at prescribed times-check 3 times a day - check blood sugar if I feel it is too high or too low - enter blood sugar readings and medication or insulin into daily log - take the blood sugar log to all doctor visits - take the blood sugar meter to all doctor visits Please see past updates related to this goal by clicking on the "Past Updates" button in the selected goal RNCM will follow up with patient to assess new needs or barriers to managing his health on 04/11/21 @ 3:45pm      Follow Up:  Patient agrees to Care Plan and  Follow-up.  Plan: The Managed Medicaid care management team will reach out to the patient again over the next 30 days.  Date/time of next scheduled RN care management/care coordination outreach:  04/11/21 @ 3:45pm  Lurena Joiner RN, BSN [place]  [employer] manager

## 2021-03-12 NOTE — Progress Notes (Signed)
HPI: FU CM/CHF. Echo 2/15 revealed EF of 20-25% with diffuse hypokinesis and biatrial enlargement, mild MR and a small pericardial effusion. TSH in February 2015 normal. Cardiac catheterization in March of 2015 showed normal coronary arteries. Also with prior ICD. CPX August 2017 showed submaximal effort with moderate functional impairment primarily circulatory limited. Last echocardiogram August 2022 showed ejection fraction 30%, severe left ventricular enlargement, grade 1 diastolic dysfunction, mild mitral regurgitation, mildly dilated aortic root at 41 mm.  Since last seen, he has the sensation that he cannot take a deep breath at times.  However he can walk 3 miles without being short of breath by his report.  No orthopnea or PND.  No pedal edema.  He continues to have occasional chest pain.  It is in various locations in his chest for seconds.  No syncope but some dizziness with standing up.  Current Outpatient Medications  Medication Sig Dispense Refill   ACCU-CHEK FASTCLIX LANCETS M[place] Use to check blood sugar 3 times daily. 102 each 11   aspirin 81 MG tablet Take 81 mg by mouth daily.     atorvastatin (LIPITOR) 10 MG tablet Take 1 tablet (10 mg total) by mouth daily. 90 tablet 1   carvedilol (COREG) 6.25 MG tablet Take 1 tablet by mouth twice daily 180 tablet 3   cetirizine (ZYRTEC) 10 MG tablet Take 1 tablet by mouth once daily 90 tablet 1   dapagliflozin propanediol (FARXIGA) 5 MG TABS tablet Take 1 tablet (5 mg total) by mouth daily before breakfast. 90 tablet 1   EPINEPHrine 0.3 mg/0.3 mL IJ SOAJ injection Inject 0.3 mg into the muscle as needed. 1 each 2   famotidine (PEPCID) 20 MG tablet Take 1 tablet by mouth once daily 90 tablet 1   fluticasone (FLONASE) 50 MCG/ACT nasal spray Use 1-2 sprays in each nostril once a day as needed for nasal congestion. 16 g 5   furosemide (LASIX) 80 MG tablet Take 1 tablet (80 mg total) by mouth 2 (two) times daily. 180 tablet 3   gabapentin  (NEURONTIN) 300 MG capsule TAKE 1 CAPSULE BY MOUTH EVERY DAY AT BEDTIME 90 capsule 1   gabapentin (NEURONTIN) 300 MG capsule Take 1 capsule (300 mg total) by mouth at bedtime. 90 capsule 3   glucose blood (ACCU-CHEK GUIDE) test strip Use as instructed to check blood sugar 3 times daily. 100 each 11   Lancet Devices (ACCU-CHEK SOFTCLIX) lancets 1 each by Other route 3 (three) times daily. 1 each 0   levalbuterol (XOPENEX HFA) 45 MCG/ACT inhaler Inhale 2 puffs into the lungs every 6 (six) hours as needed for wheezing or shortness of breath. 15 g 1   methocarbamol (ROBAXIN) 500 MG tablet Take 1 tablet (500 mg total) by mouth daily as needed for muscle spasms. 90 tablet 1   mometasone (ELOCON) 0.1 % cream Apply 1 application topically daily. 45 g 1   potassium chloride (KLOR-CON) 10 MEQ tablet Take 10 mEq by mouth daily.     sacubitril-valsartan (ENTRESTO) 24-26 MG Take 1 tablet by mouth 2 (two) times daily. 60 tablet 5   sildenafil (VIAGRA) 100 MG tablet Take 1-2 tabs PO 1/2-1 hr prior to intercourse. 20 tablet 4   sitaGLIPtin (JANUVIA) 25 MG tablet Take 1 tablet (25 mg total) by mouth daily. 90 tablet 4   tamsulosin (FLOMAX) 0.4 MG CAPS capsule Take 2 capsules (0.8 mg total) by mouth daily. 180 capsule 1   triamcinolone cream (KENALOG) 0.1 %  Apply 1 application topically 2 (two) times daily as needed. Do not use on the face, neck, armpits or groin area. Do not use more than 3 weeks in a row. 30 g 0   No current facility-administered medications for this visit.     Past Medical History:  Diagnosis Date   AICD (automatic cardioverter/defibrillator) present    Chronic bronchitis ([place])    "get it most q yr" (12/23/2013)   Chronic systolic (congestive) heart failure ([place])    Fracture of left humerus    a. 07/2013.   GERD (gastroesophageal reflux disease)    not at present time   Heart murmur    "born w/it"    History of renal calculi    Hypertension    Kidney stones    NICM (nonischemic  cardiomyopathy) ([place])    a. 07/2013 Echo: EF 20-25%, diff HK, Gr2 DD, mild MR, mod dil LA/RA. 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    Shockable heart rhythm detected by automated external defibrillator    Syncope    Type II diabetes mellitus ([place]) 2006    Past Surgical History:  Procedure Laterality Date   CARDIAC CATHETERIZATION  08/10/13   CHOLECYSTECTOMY  ~ 2010   IMPLANTABLE CARDIOVERTER DEFIBRILLATOR IMPLANT  12/23/2013   STJ Fortify ICD implanted by [name] for cardiomyopathy and syncope   IMPLANTABLE CARDIOVERTER DEFIBRILLATOR IMPLANT N/A 12/23/2013   Procedure: IMPLANTABLE CARDIOVERTER DEFIBRILLATOR IMPLANT;  Surgeon: Coralyn Mark, MD;  Location: [facility] CATH LAB;  Service: Cardiovascular;  Laterality: N/A;   INGUINAL HERNIA REPAIR Left 03/01/2018   Procedure: OPEN REPAIR OF LEFT INGUINAL HERNIA WITH MESH;  Surgeon: Greer Pickerel, MD;  Location: [facility] ORS;  Service: General;  Laterality: Left;   LEFT HEART CATHETERIZATION WITH CORONARY ANGIOGRAM N/A 08/10/2013   Procedure: LEFT HEART CATHETERIZATION WITH CORONARY ANGIOGRAM;  Surgeon: Jettie Booze, MD;  Location: [facility] CATH LAB;  Service: Cardiovascular;  Laterality: N/A;   ORIF HUMERUS FRACTURE Left 08/12/2013   Procedure: OPEN REDUCTION INTERNAL FIXATION (ORIF) HUMERAL SHAFT FRACTURE;  Surgeon: Newt Minion, MD;  Location: [place];  Service: Orthopedics;  Laterality: Left;  Open Reduction Internal Fixation Left Humerus   URETEROSCOPY     "laser for kidney stones"    Social History   Socioeconomic History   Marital status: Single    Spouse name: Not on file   Number of children: Not on file   Years of education: Not on file   Highest education level: Not on file  Occupational History   Occupation: Disabled  Tobacco Use   Smoking status: Former    Packs/day: 1.00    Years: 28.00    Pack years: 28.00    Types: Cigarettes    Quit date: 06/03/2014     Years since quitting: 6.8   Smokeless tobacco: Never  Vaping Use   Vaping Use: Never used  Substance and Sexual Activity   Alcohol use: Yes    Alcohol/week: 0.0 standard drinks    Comment: 12/23/2013 "might drink a couple times/yr"   Drug use: Yes    Types: Marijuana    Comment: 02/25/18- patient  states has not used marjuana   Sexual activity: Yes  Other Topics Concern   Not on file  Social History Narrative   Pt lives with his brother    Social Determinants of Health   Financial Resource Strain: Not on file  Food Insecurity:  No Food Insecurity   Worried About Charity fundraiser in the Last Year: Never true   Ran Out of Food in the Last Year: Never true  Transportation Needs: No Transportation Needs   Lack of Transportation (Medical): No   Lack of Transportation (Non-Medical): No  Physical Activity: Not on file  Stress: Not on file  Social Connections: Moderately Integrated   Frequency of Communication with Friends and Family: More than three times a week   Frequency of Social Gatherings with Friends and Family: More than three times a week   Attends Religious Services: More than 4 times per year   Active Member of [employer] or Organizations: Yes   Attends Archivist Meetings: Never   Marital Status: Never married  Human resources officer Violence: Not on file    Family History  Problem Relation Age of Onset   Diabetes Father    Arthritis Father    Hypertension Father    Diabetes Mother    Arthritis Mother    Hypertension Mother    Hypertension Brother     ROS: no fevers or chills, productive cough, hemoptysis, dysphasia, odynophagia, melena, hematochezia, dysuria, hematuria, rash, seizure activity, orthopnea, PND, pedal edema, claudication. Remaining systems are negative.  Physical Exam: Well-developed well-nourished in no acute distress.  Skin is warm and dry.  HEENT is normal.  Neck is supple.  Chest is clear to auscultation with normal expansion.   Cardiovascular exam is regular rate and rhythm.  Abdominal exam nontender or distended. No masses palpated. Extremities show no edema. neuro grossly intact  A/P  1 nonischemic cardiomyopathy-we will continue medical therapy including Entresto and carvedilol.  2 chronic systolic congestive heart failure-he is euvolemic on examination.  We will continue Lasix at present dose.  Continue Farxiga.  He describes orthostatic symptoms.  I will therefore not add spironolactone at this point but will consider 12.5 mg daily in the future.  3 chronic chest pain-patient has had atypical chest pain chronically.  I do not think we need to pursue further ischemia evaluation at this point.  4 ICD-Per Dr. Rayann Heman.  5 hyperlipidemia-continue statin.  6 chronic stage III kidney disease-followed by nephrology.  [name], MD

## 2021-03-14 ENCOUNTER — Encounter: Payer: Self-pay | Admitting: Allergy

## 2021-03-14 ENCOUNTER — Other Ambulatory Visit: Payer: Self-pay | Admitting: Allergy

## 2021-03-18 ENCOUNTER — Other Ambulatory Visit: Payer: Self-pay

## 2021-03-18 ENCOUNTER — Ambulatory Visit (INDEPENDENT_AMBULATORY_CARE_PROVIDER_SITE_OTHER): Payer: Medicaid Other

## 2021-03-18 ENCOUNTER — Ambulatory Visit (INDEPENDENT_AMBULATORY_CARE_PROVIDER_SITE_OTHER): Payer: Medicaid Other | Admitting: Podiatry

## 2021-03-18 DIAGNOSIS — M722 Plantar fascial fibromatosis: Secondary | ICD-10-CM

## 2021-03-18 DIAGNOSIS — Z95810 Presence of automatic (implantable) cardiac defibrillator: Secondary | ICD-10-CM | POA: Diagnosis not present

## 2021-03-18 DIAGNOSIS — I5022 Chronic systolic (congestive) heart failure: Secondary | ICD-10-CM

## 2021-03-19 ENCOUNTER — Encounter: Payer: Self-pay | Admitting: *Deleted

## 2021-03-19 ENCOUNTER — Ambulatory Visit (INDEPENDENT_AMBULATORY_CARE_PROVIDER_SITE_OTHER): Payer: Medicaid Other | Admitting: Student

## 2021-03-19 ENCOUNTER — Encounter: Payer: Self-pay | Admitting: Internal Medicine

## 2021-03-19 ENCOUNTER — Encounter: Payer: Self-pay | Admitting: Podiatry

## 2021-03-19 ENCOUNTER — Ambulatory Visit: Payer: Medicaid Other | Attending: Internal Medicine | Admitting: Internal Medicine

## 2021-03-19 VITALS — BP 105/67 | HR 83 | Resp 16 | Wt 258.4 lb

## 2021-03-19 VITALS — BP 92/56 | HR 63 | Ht 73.0 in | Wt 257.4 lb

## 2021-03-19 DIAGNOSIS — Z006 Encounter for examination for normal comparison and control in clinical research program: Secondary | ICD-10-CM

## 2021-03-19 DIAGNOSIS — Z95810 Presence of automatic (implantable) cardiac defibrillator: Secondary | ICD-10-CM | POA: Diagnosis not present

## 2021-03-19 DIAGNOSIS — Z794 Long term (current) use of insulin: Secondary | ICD-10-CM | POA: Insufficient documentation

## 2021-03-19 DIAGNOSIS — Z9049 Acquired absence of other specified parts of digestive tract: Secondary | ICD-10-CM | POA: Diagnosis not present

## 2021-03-19 DIAGNOSIS — E669 Obesity, unspecified: Secondary | ICD-10-CM | POA: Insufficient documentation

## 2021-03-19 DIAGNOSIS — Z8249 Family history of ischemic heart disease and other diseases of the circulatory system: Secondary | ICD-10-CM | POA: Insufficient documentation

## 2021-03-19 DIAGNOSIS — M545 Low back pain, unspecified: Secondary | ICD-10-CM | POA: Insufficient documentation

## 2021-03-19 DIAGNOSIS — Z87891 Personal history of nicotine dependence: Secondary | ICD-10-CM | POA: Diagnosis not present

## 2021-03-19 DIAGNOSIS — Z2821 Immunization not carried out because of patient refusal: Secondary | ICD-10-CM

## 2021-03-19 DIAGNOSIS — E1142 Type 2 diabetes mellitus with diabetic polyneuropathy: Secondary | ICD-10-CM | POA: Insufficient documentation

## 2021-03-19 DIAGNOSIS — Z91030 Bee allergy status: Secondary | ICD-10-CM | POA: Diagnosis not present

## 2021-03-19 DIAGNOSIS — N401 Enlarged prostate with lower urinary tract symptoms: Secondary | ICD-10-CM | POA: Insufficient documentation

## 2021-03-19 DIAGNOSIS — I428 Other cardiomyopathies: Secondary | ICD-10-CM

## 2021-03-19 DIAGNOSIS — Z8616 Personal history of COVID-19: Secondary | ICD-10-CM | POA: Diagnosis not present

## 2021-03-19 DIAGNOSIS — K219 Gastro-esophageal reflux disease without esophagitis: Secondary | ICD-10-CM | POA: Diagnosis not present

## 2021-03-19 DIAGNOSIS — E1169 Type 2 diabetes mellitus with other specified complication: Secondary | ICD-10-CM | POA: Diagnosis not present

## 2021-03-19 DIAGNOSIS — Z833 Family history of diabetes mellitus: Secondary | ICD-10-CM | POA: Diagnosis not present

## 2021-03-19 DIAGNOSIS — E1122 Type 2 diabetes mellitus with diabetic chronic kidney disease: Secondary | ICD-10-CM | POA: Insufficient documentation

## 2021-03-19 DIAGNOSIS — Z79899 Other long term (current) drug therapy: Secondary | ICD-10-CM | POA: Insufficient documentation

## 2021-03-19 DIAGNOSIS — I5022 Chronic systolic (congestive) heart failure: Secondary | ICD-10-CM | POA: Diagnosis not present

## 2021-03-19 DIAGNOSIS — Z6834 Body mass index (BMI) 34.0-34.9, adult: Secondary | ICD-10-CM | POA: Diagnosis not present

## 2021-03-19 DIAGNOSIS — Z7982 Long term (current) use of aspirin: Secondary | ICD-10-CM | POA: Insufficient documentation

## 2021-03-19 DIAGNOSIS — N1832 Chronic kidney disease, stage 3b: Secondary | ICD-10-CM | POA: Diagnosis not present

## 2021-03-19 DIAGNOSIS — M4800 Spinal stenosis, site unspecified: Secondary | ICD-10-CM | POA: Diagnosis not present

## 2021-03-19 DIAGNOSIS — M47819 Spondylosis without myelopathy or radiculopathy, site unspecified: Secondary | ICD-10-CM | POA: Diagnosis not present

## 2021-03-19 DIAGNOSIS — Z760 Encounter for issue of repeat prescription: Secondary | ICD-10-CM | POA: Insufficient documentation

## 2021-03-19 LAB — POCT GLYCOSYLATED HEMOGLOBIN (HGB A1C): HbA1c, POC (controlled diabetic range): 8.9 % — AB (ref 0.0–7.0)

## 2021-03-19 LAB — GLUCOSE, POCT (MANUAL RESULT ENTRY): POC Glucose: 215 mg/dl — AB (ref 70–99)

## 2021-03-19 NOTE — Research (Signed)
Spoke with patient about the [place] study. Explained and answered questions about the study.  Gave him information about the study and a consent for him to read over.  Told patient to call if he was interested in participating.

## 2021-03-19 NOTE — Progress Notes (Signed)
Patient ID: DEMETRICK TIREY, male    DOB: Apr 12, 1966  MRN: [id number]  CC: Diabetes and Medication Refill   Subjective: [name] is a 55 y.o. male who presents for chronic ds management His concerns today include:  Patient with history of NICM, systolic CHF s/p ICD placement, DM type 2, HL, CKD stage 3,  obesity, BPH, GERD and chronic LBP secondary to spondylosis and spinal stenosis, COVID-19 infection  Has velcro boot on LT foot.  Diagnosed with plantar fasciitis by podiatry.  He will be wearing the boot for about 8 weeks.  DM: Results for orders placed or performed in visit on 03/19/21  POCT glucose (manual entry)  Result Value Ref Range   POC Glucose 215 (A) 70 - 99 mg/dl  POCT glycosylated hemoglobin (Hb A1C)  Result Value Ref Range   Hemoglobin A1C     HbA1c POC (<> result, manual entry)     HbA1c, POC (prediabetic range)     HbA1c, POC (controlled diabetic range) 8.9 (A) 0.0 - 7.0 %   -taking Januvia and Farxiga. Given steroid shot in foot 3 wks ago.  Told this may increase BS level. Not checking BS Will not be able to walk/exercise for the next 8 wks because of the boot that he is wearing for the [place] fasciitis.  CHF: Patient feels a bit down today.  He states he received bad news from the electrophysiologist regarding his heart function.  Heart function was previously 45 to 50% but now it is at 30% based on recent echo.  He is being recommended for some type of device call BAROSTEM device since heart function did not improve on guideline directed therapy.  Patient states he felt that his heart function was not doing as well as it should because of shortness of breath that he gets intermittently.  He reports compliance with his medications.   Will see [name] tomorrow  CKD: has appt coming up with his kidney specialist soon.  GFR has remained in the range for stage III.  HM: decline flu shot, had eye exam 2 mths ago got new glasses.  He has had 1 COVID-19  vaccine shot.  He does not plan to finish the series because he states the first shot made him sick. Patient Active Problem List   Diagnosis Date Noted   CKD (chronic kidney disease) stage 2, GFR 60-89 ml/min 10/24/2019   Acute non-recurrent frontal sinusitis 06/27/2019   COVID-19 virus infection 06/27/2019   Glaucoma suspect 12/22/2018   Seasonal and perennial allergic rhinoconjunctivitis 10/25/2018   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   Urticaria 05/03/2018   S/P inguinal hernia repair 03/01/2018   Unilateral inguinal hernia without obstruction or gangrene 07/24/2017   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   Hypersomnia 02/28/2016   Abnormal PFT 02/24/2016   Shortness of breath 01/28/2016   Diabetic polyneuropathy associated with type 2 diabetes mellitus ([place]) 12/18/2015   Lumbar back pain 12/18/2015   Controlled type 2 diabetes mellitus with complication, with long-term current use of insulin ([place]) 09/17/2015   Rash and other nonspecific skin eruption 05/14/2015   Left median nerve neuropathy 04/16/2015   Lesion of right median nerve at forearm 04/16/2015   ICD (implantable cardioverter-defibrillator) in place [id number]   Chronic systolic congestive heart failure ([place]) 03/28/2014   Nonischemic cardiomyopathy ([place]) 11/09/2013   Syncope 10/20/2013   Chronic chest pain 07/28/2013   RENAL CALCULUS 05/13/2009   Obesity 05/08/2009   Former heavy cigarette  smoker (20-39 per day) 05/08/2009   HOARSENESS 05/08/2009   ANXIETY 02/28/2008   ASTHMATIC BRONCHITIS, ACUTE 02/28/2008   GERD 02/28/2008   IRRITABLE BOWEL SYNDROME 02/28/2008     Current Outpatient Medications on File Prior to Visit  Medication Sig Dispense Refill   ACCU-CHEK FASTCLIX LANCETS MISC Use to check blood sugar 3 times daily. 102 each 11   aspirin 81 MG tablet Take 81 mg by mouth daily.     atorvastatin (LIPITOR) 10 MG tablet Take 1 tablet (10 mg total) by mouth daily. 90 tablet  1   carvedilol (COREG) 6.25 MG tablet Take 1 tablet by mouth twice daily 180 tablet 3   cetirizine (ZYRTEC) 10 MG tablet Take 1 tablet by mouth once daily 90 tablet 1   dapagliflozin propanediol (FARXIGA) 5 MG TABS tablet Take 1 tablet (5 mg total) by mouth daily before breakfast. 90 tablet 1   EPINEPHrine 0.3 mg/0.3 mL IJ SOAJ injection Inject 0.3 mg into the muscle as needed. 1 each 2   famotidine (PEPCID) 20 MG tablet Take 1 tablet by mouth once daily 90 tablet 1   fluticasone (FLONASE) 50 MCG/ACT nasal spray Use 1-2 sprays in each nostril once a day as needed for nasal congestion. 16 g 5   furosemide (LASIX) 80 MG tablet Take 1 tablet (80 mg total) by mouth 2 (two) times daily. 180 tablet 3   gabapentin (NEURONTIN) 300 MG capsule TAKE 1 CAPSULE BY MOUTH EVERY DAY AT BEDTIME 90 capsule 1   gabapentin (NEURONTIN) 300 MG capsule Take 1 capsule (300 mg total) by mouth at bedtime. 90 capsule 3   glucose blood (ACCU-CHEK GUIDE) test strip Use as instructed to check blood sugar 3 times daily. 100 each 11   Lancet Devices (ACCU-CHEK SOFTCLIX) lancets 1 each by Other route 3 (three) times daily. 1 each 0   levalbuterol (XOPENEX HFA) 45 MCG/ACT inhaler Inhale 2 puffs into the lungs every 6 (six) hours as needed for wheezing or shortness of breath. 15 g 1   methocarbamol (ROBAXIN) 500 MG tablet Take 1 tablet (500 mg total) by mouth daily as needed for muscle spasms. 90 tablet 1   mometasone (ELOCON) 0.1 % cream Apply 1 application topically daily. 45 g 1   potassium chloride (KLOR-CON) 10 MEQ tablet Take 10 mEq by mouth daily.     sacubitril-valsartan (ENTRESTO) 24-26 MG Take 1 tablet by mouth 2 (two) times daily. 60 tablet 5   sildenafil (VIAGRA) 100 MG tablet Take 1-2 tabs PO 1/2-1 hr prior to intercourse. 20 tablet 4   sitaGLIPtin (JANUVIA) 25 MG tablet Take 1 tablet (25 mg total) by mouth daily. 90 tablet 4   tamsulosin (FLOMAX) 0.4 MG CAPS capsule Take 2 capsules (0.8 mg total) by mouth daily. 180  capsule 1   triamcinolone cream (KENALOG) 0.1 % Apply 1 application topically 2 (two) times daily as needed. Do not use on the face, neck, armpits or groin area. Do not use more than 3 weeks in a row. 30 g 0   No current facility-administered medications on file prior to visit.    Allergies  Allergen Reactions   Bee Venom Anaphylaxis    Social History   Socioeconomic History   Marital status: Single    Spouse name: Not on file   Number of children: Not on file   Years of education: Not on file   Highest education level: Not on file  Occupational History   Occupation: Disabled  Tobacco Use  Smoking status: Former    Packs/day: 1.00    Years: 28.00    Pack years: 28.00    Types: Cigarettes    Quit date: 06/03/2014    Years since quitting: 6.7   Smokeless tobacco: Never  Vaping Use   Vaping Use: Never used  Substance and Sexual Activity   Alcohol use: Yes    Alcohol/week: 0.0 standard drinks    Comment: 12/23/2013 "might drink a couple times/yr"   Drug use: Yes    Types: Marijuana    Comment: 02/25/18- patient  states has not used marjuana   Sexual activity: Yes  Other Topics Concern   Not on file  Social History Narrative   Pt lives with his brother    Social Determinants of Health   Financial Resource Strain: Not on file  Food Insecurity: No Food Insecurity   Worried About Charity fundraiser in the Last Year: Never true   Arboriculturist in the Last Year: Never true  Transportation Needs: No Transportation Needs   Lack of Transportation (Medical): No   Lack of Transportation (Non-Medical): No  Physical Activity: Not on file  Stress: Not on file  Social Connections: Moderately Integrated   Frequency of Communication with Friends and Family: More than three times a week   Frequency of Social Gatherings with Friends and Family: More than three times a week   Attends Religious Services: More than 4 times per year   Active Member of [employer] or Organizations: Yes    Attends Archivist Meetings: Never   Marital Status: Never married  Human resources officer Violence: Not on file    Family History  Problem Relation Age of Onset   Diabetes Father    Arthritis Father    Hypertension Father    Diabetes Mother    Arthritis Mother    Hypertension Mother    Hypertension Brother     Past Surgical History:  Procedure Laterality Date   CARDIAC CATHETERIZATION  08/10/13   CHOLECYSTECTOMY  ~ 2010   IMPLANTABLE CARDIOVERTER DEFIBRILLATOR IMPLANT  12/23/2013   STJ Fortify ICD implanted by [name] for cardiomyopathy and syncope   IMPLANTABLE CARDIOVERTER DEFIBRILLATOR IMPLANT N/A 12/23/2013   Procedure: IMPLANTABLE CARDIOVERTER DEFIBRILLATOR IMPLANT;  Surgeon: Coralyn Mark, MD;  Location: [facility] CATH LAB;  Service: Cardiovascular;  Laterality: N/A;   INGUINAL HERNIA REPAIR Left 03/01/2018   Procedure: OPEN REPAIR OF LEFT INGUINAL HERNIA WITH MESH;  Surgeon: Greer Pickerel, MD;  Location: [facility];  Service: General;  Laterality: Left;   LEFT HEART CATHETERIZATION WITH CORONARY ANGIOGRAM N/A 08/10/2013   Procedure: LEFT HEART CATHETERIZATION WITH CORONARY ANGIOGRAM;  Surgeon: Jettie Booze, MD;  Location[facility] CATH LAB;  Service: Cardiovascular;  Laterality: N/A;   ORIF HUMERUS FRACTURE Left 08/12/2013   Procedure: OPEN REDUCTION INTERNAL FIXATION (ORIF) HUMERAL SHAFT FRACTURE;  Surgeon: Newt Minion, MD;  Location: [facility];  Service: Orthopedics;  Laterality: Left;  Open Reduction Internal Fixation Left Humerus   URETEROSCOPY     "laser for kidney stones"    ROS: Review of Systems Negative except as stated above  PHYSICAL EXAM: BP 105/67   Pulse 83   Resp 16   Wt 258 lb 6.4 oz (117.2 kg)   SpO2 97%   BMI 34.09 kg/m   Wt Readings from Last 3 Encounters:  03/19/21 258 lb 6.4 oz (117.2 kg)  03/19/21 257 lb 6.4 oz (116.8 kg)  01/14/21 252 lb (114.3 kg)    Physical  Exam   General appearance - alert, well appearing, and in no distress Mental status  -patient appears down. Chest - clear to auscultation, no wheezes, rales or rhonchi, symmetric air entry Heart - normal rate, regular rhythm, normal S1, S2, no murmurs, rubs, clicks or gallops Extremities -boot was not removed from the left leg.  No edema in the right leg.  CMP Latest Ref Rng & Units 01/14/2021 07/17/2020 10/14/2019  Glucose 65 - 99 mg/dL 238(H) 253(H) 130(H)  BUN 6 - 24 mg/dL '11 13 11  '$ Creatinine 0.76 - 1.27 mg/dL 1.82(H) 1.66(H) 1.51(H)  Sodium 134 - 144 mmol/L 143 142 148(H)  Potassium 3.5 - 5.2 mmol/L 3.8 3.7 3.8  Chloride 96 - 106 mmol/L 102 101 104  CO2 20 - 29 mmol/L '25 25 25  '$ Calcium 8.7 - 10.2 mg/dL 10.1 9.1 9.7  Total Protein 6.0 - 8.5 g/dL - - 7.4  Total Bilirubin 0.0 - 1.2 mg/dL - - 0.4  Alkaline Phos 39 - 117 IU/L - - 74  AST 0 - 40 IU/L - - 15  ALT 0 - 44 IU/L - - 13   Lipid Panel     Component Value Date/Time   CHOL 115 10/14/2019 0000   TRIG 66 10/14/2019 0000   HDL 44 10/14/2019 0000   CHOLHDL 2.6 10/14/2019 0000   CHOLHDL 4 05/08/2009 1040   VLDL 27.0 05/08/2009 1040   LDLCALC 57 10/14/2019 0000    CBC    Component Value Date/Time   WBC 4.5 06/28/2018 1440   WBC 4.7 02/26/2018 1134   RBC 4.17 06/28/2018 1440   RBC 4.06 (L) 02/26/2018 1134   HGB 12.5 (L) 06/28/2018 1440   HCT 36.9 (L) 06/28/2018 1440   PLT 301 06/28/2018 1440   MCV 89 06/28/2018 1440   MCH 30.0 06/28/2018 1440   MCH 30.0 02/26/2018 1134   MCHC 33.9 06/28/2018 1440   MCHC 33.3 02/26/2018 1134   RDW 13.4 06/28/2018 1440   LYMPHSABS 1.7 06/28/2018 1440   MONOABS 0.6 09/21/2017 2158   EOSABS 0.3 06/28/2018 1440   BASOSABS 0.0 06/28/2018 1440    ASSESSMENT AND PLAN: 1. Type 2 diabetes mellitus with obesity (HCC) Not at goal.  I discussed having him stop Januvia and trying him on Trulicity instead.  However patient states he would prefer not to make any changes to his medications at this time.  He will work on getting his blood sugars down.  I have encouraged him to check  the blood sugars at least once a day. - POCT glucose (manual entry) - POCT glycosylated hemoglobin (Hb A1C)  2. Chronic systolic congestive heart failure (HCC) Currently compensated.  He will continue his current medications and keep his follow-up appointment with cardiology tomorrow. He is due for lipid profile.  He prefers to wait until tomorrow to get it done through his cardiologist. 3. Stage 3b chronic kidney disease ([place]) Stable.  Continue to monitor.  Keep follow-up appointment with nephrologist  4. Influenza vaccination declined    Patient was given the opportunity to ask questions.  Patient verbalized understanding of the plan and was able to repeat key elements of the plan.   Orders Placed This Encounter  Procedures   POCT glucose (manual entry)   POCT glycosylated hemoglobin (Hb A1C)     Requested Prescriptions    No prescriptions requested or ordered in this encounter    No follow-ups on file.  Karle Plumber, MD, FACP

## 2021-03-19 NOTE — Patient Instructions (Signed)
Medication Instructions:  Your physician recommends that you continue on your current medications as directed. Please refer to the Current Medication list given to you today.  *If you need a refill on your cardiac medications before your next appointment, please call your pharmacy*   Lab Work: TODAY:  BMT, CBC, & PRO BNP If you have labs (blood work) drawn today and your tests are completely normal, you will receive your results only by: [place] (if you have MyChart) OR A paper copy in the mail If you have any lab test that is abnormal or we need to change your treatment, we will call you to review the results.   Testing/Procedures: None ordered   Follow-Up: At [facility], you and your health needs are our priority.  As part of our continuing mission to provide you with exceptional heart care, we have created designated Provider Care Teams.  These Care Teams include your primary Cardiologist (physician) and Advanced Practice Providers (APPs -  Physician Assistants and Nurse Practitioners) who all work together to provide you with the care you need, when you need it.  We recommend signing up for the patient portal called "MyChart".  Sign up information is provided on this After Visit Summary.  MyChart is used to connect with patients for Virtual Visits (Telemedicine).  Patients are able to view lab/test results, encounter notes, upcoming appointments, etc.  Non-urgent messages can be sent to your provider as well.   To learn more about what you can do with MyChart, go to [email or web address].    Your next appointment:   6 month(s)  The format for your next appointment:   In Person  Provider:   [name] "Oda Kilts, PA-C   Other Instructions

## 2021-03-19 NOTE — Progress Notes (Signed)
Electrophysiology Office Note Date: 03/19/2021  ID:  CRISTOVAL SHINES, DOB [birth year], MRN [id number]  PCP: [name], MD Primary Cardiologist: [name], MD Electrophysiologist: Thompson Grayer, MD   CC: Routine ICD follow-up  [name] is a 55 y.o. male seen today for Thompson Grayer, MD for  Barostim consideration .  Since last being seen in our clinic the patient reports doing about the same. He has SOB with any more than mild exertion. SOB with changing clothes, bathing, or walking 1-2 aisles in the grocery store even slowly.  He has occasional chest heaviness when he is SOB. Denies syncope. He has lightheadedness with rapid standing on occasion. He has not had ICD shocks.   Device History: St. Jude Single Chamber ICD implanted 12/2013 for CHF  Past Medical History:  Diagnosis Date   AICD (automatic cardioverter/defibrillator) present    Chronic bronchitis ([place])    "get it most q yr" (12/23/2013)   Chronic systolic (congestive) heart failure ([place])    Fracture of left humerus    a. 07/2013.   GERD (gastroesophageal reflux disease)    not at present time   Heart murmur    "born w/it"    History of renal calculi    Hypertension    Kidney stones    NICM (nonischemic cardiomyopathy) ([place])    a. 07/2013 Echo: EF 20-25%, diff HK, Gr2 DD, mild MR, mod dil LA/RA. 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    Shockable heart rhythm detected by automated external defibrillator    Syncope    Type II diabetes mellitus ([place]) 2006   Past Surgical History:  Procedure Laterality Date   CARDIAC CATHETERIZATION  08/10/13   CHOLECYSTECTOMY  ~ 2010   IMPLANTABLE CARDIOVERTER DEFIBRILLATOR IMPLANT  12/23/2013   STJ Fortify ICD implanted by [name] for cardiomyopathy and syncope   IMPLANTABLE CARDIOVERTER DEFIBRILLATOR IMPLANT N/A 12/23/2013   Procedure: IMPLANTABLE CARDIOVERTER DEFIBRILLATOR IMPLANT;   Surgeon: Coralyn Mark, MD;  Location: [facility] CATH LAB;  Service: Cardiovascular;  Laterality: N/A;   INGUINAL HERNIA REPAIR Left 03/01/2018   Procedure: OPEN REPAIR OF LEFT INGUINAL HERNIA WITH MESH;  Surgeon: Greer Pickerel, MD;  Location: [facility] ORS;  Service: General;  Laterality: Left;   LEFT HEART CATHETERIZATION WITH CORONARY ANGIOGRAM N/A 08/10/2013   Procedure: LEFT HEART CATHETERIZATION WITH CORONARY ANGIOGRAM;  Surgeon: Jettie Booze, MD;  Location: [facility] CATH LAB;  Service: Cardiovascular;  Laterality: N/A;   ORIF HUMERUS FRACTURE Left 08/12/2013   Procedure: OPEN REDUCTION INTERNAL FIXATION (ORIF) HUMERAL SHAFT FRACTURE;  Surgeon: Newt Minion, MD;  Location: [place];  Service: Orthopedics;  Laterality: Left;  Open Reduction Internal Fixation Left Humerus   URETEROSCOPY     "laser for kidney stones"    Current Outpatient Medications  Medication Sig Dispense Refill   ACCU-CHEK FASTCLIX LANCETS MISC Use to check blood sugar 3 times daily. 102 each 11   aspirin 81 MG tablet Take 81 mg by mouth daily.     atorvastatin (LIPITOR) 10 MG tablet Take 1 tablet (10 mg total) by mouth daily. 90 tablet 1   carvedilol (COREG) 6.25 MG tablet Take 1 tablet by mouth twice daily 180 tablet 3   cetirizine (ZYRTEC) 10 MG tablet Take 1 tablet by mouth once daily 90 tablet 1   dapagliflozin propanediol (FARXIGA) 5 MG TABS tablet Take 1 tablet (5 mg total) by mouth daily  before breakfast. 90 tablet 1   EPINEPHrine 0.3 mg/0.3 mL IJ SOAJ injection Inject 0.3 mg into the muscle as needed. 1 each 2   famotidine (PEPCID) 20 MG tablet Take 1 tablet by mouth once daily 90 tablet 1   fluticasone (FLONASE) 50 MCG/ACT nasal spray Use 1-2 sprays in each nostril once a day as needed for nasal congestion. 16 g 5   furosemide (LASIX) 80 MG tablet Take 1 tablet (80 mg total) by mouth 2 (two) times daily. 180 tablet 3   gabapentin (NEURONTIN) 300 MG capsule TAKE 1 CAPSULE BY MOUTH EVERY DAY AT BEDTIME 90 capsule 1   gabapentin  (NEURONTIN) 300 MG capsule Take 1 capsule (300 mg total) by mouth at bedtime. 90 capsule 3   glucose blood (ACCU-CHEK GUIDE) test strip Use as instructed to check blood sugar 3 times daily. 100 each 11   Lancet Devices (ACCU-CHEK SOFTCLIX) lancets 1 each by Other route 3 (three) times daily. 1 each 0   levalbuterol (XOPENEX HFA) 45 MCG/ACT inhaler Inhale 2 puffs into the lungs every 6 (six) hours as needed for wheezing or shortness of breath. 15 g 1   methocarbamol (ROBAXIN) 500 MG tablet Take 1 tablet (500 mg total) by mouth daily as needed for muscle spasms. 90 tablet 1   mometasone (ELOCON) 0.1 % cream Apply 1 application topically daily. 45 g 1   potassium chloride (KLOR-CON) 10 MEQ tablet Take 10 mEq by mouth daily.     sacubitril-valsartan (ENTRESTO) 24-26 MG Take 1 tablet by mouth 2 (two) times daily. 60 tablet 5   sildenafil (VIAGRA) 100 MG tablet Take 1-2 tabs PO 1/2-1 hr prior to intercourse. 20 tablet 4   sitaGLIPtin (JANUVIA) 25 MG tablet Take 1 tablet (25 mg total) by mouth daily. 90 tablet 4   tamsulosin (FLOMAX) 0.4 MG CAPS capsule Take 2 capsules (0.8 mg total) by mouth daily. 180 capsule 1   triamcinolone cream (KENALOG) 0.1 % Apply 1 application topically 2 (two) times daily as needed. Do not use on the face, neck, armpits or groin area. Do not use more than 3 weeks in a row. 30 g 0   No current facility-administered medications for this visit.    Allergies:   Bee venom   Social History: Social History   Socioeconomic History   Marital status: Single    Spouse name: Not on file   Number of children: Not on file   Years of education: Not on file   Highest education level: Not on file  Occupational History   Occupation: Disabled  Tobacco Use   Smoking status: Former    Packs/day: 1.00    Years: 28.00    Pack years: 28.00    Types: Cigarettes    Quit date: 06/03/2014    Years since quitting: 6.7   Smokeless tobacco: Never  Vaping Use   Vaping Use: Never used   Substance and Sexual Activity   Alcohol use: Yes    Alcohol/week: 0.0 standard drinks    Comment: 12/23/2013 "might drink a couple times/yr"   Drug use: Yes    Types: Marijuana    Comment: 02/25/18- patient  states has not used marjuana   Sexual activity: Yes  Other Topics Concern   Not on file  Social History Narrative   Pt lives with his brother    Social Determinants of Health   Financial Resource Strain: Not on file  Food Insecurity: No Food Insecurity   Worried About Running Out of  Food in the Last Year: Never true   [place] in the Last Year: Never true  Transportation Needs: No Transportation Needs   Lack of Transportation (Medical): No   Lack of Transportation (Non-Medical): No  Physical Activity: Not on file  Stress: Not on file  Social Connections: Moderately Integrated   Frequency of Communication with Friends and Family: More than three times a week   Frequency of Social Gatherings with Friends and Family: More than three times a week   Attends Religious Services: More than 4 times per year   Active Member of [employer] or Organizations: Yes   Attends Archivist Meetings: Never   Marital Status: Never married  Human resources officer Violence: Not on file    Family History: Family History  Problem Relation Age of Onset   Diabetes Father    Arthritis Father    Hypertension Father    Diabetes Mother    Arthritis Mother    Hypertension Mother    Hypertension Brother     Review of Systems: All other systems reviewed and are otherwise negative except as noted above.   Physical Exam: Vitals:   03/19/21 1211  BP: (!) 92/56  Pulse: 63  SpO2: 96%  Weight: 257 lb 6.4 oz (116.8 kg)  Height: '6\' 1"'$  (1.854 m)     GEN- The patient is well appearing, alert and oriented x 3 today.   HEENT: normocephalic, atraumatic; sclera clear, conjunctiva pink; hearing intact; oropharynx clear; neck supple, no JVP Lymph- no cervical lymphadenopathy Lungs- Clear to  ausculation bilaterally, normal work of breathing.  No wheezes, rales, rhonchi Heart- Regular rate and rhythm, no murmurs, rubs or gallops, PMI not laterally displaced GI- soft, non-tender, non-distended, bowel sounds present, no hepatosplenomegaly Extremities- no clubbing or cyanosis. No edema; DP/PT/radial pulses 2+ bilaterally MS- no significant deformity or atrophy Skin- warm and dry, no rash or lesion; ICD pocket well healed Psych- euthymic mood, full affect Neuro- strength and sensation are intact  EKG:  EKG is not ordered today.   Recent Labs: 01/14/2021: BUN 11; Creatinine, Ser 1.82; NT-Pro BNP 144; Potassium 3.8; Sodium 143   Wt Readings from Last 3 Encounters:  03/19/21 257 lb 6.4 oz (116.8 kg)  01/14/21 252 lb (114.3 kg)  11/12/20 243 lb 9.6 oz (110.5 kg)     Other studies Reviewed: Additional studies/ records that were reviewed today include: Previous EP office notes.   Assessment and Plan:  1.  Chronic systolic dysfunction s/p St. Jude single chamber ICD  euvolemic today On an appropriate medical regimen Normal ICD function See [facility] Art report No changes today  He is very interested in BAT therapy. He would like to proceed with Research consideration for [place]. Joelene Millin came to office visit today.  Kenn Schnoor Seaberg's heart failure has failed to improve despite titration of guideline directed medication such that he qualifies for the [facility] NEO device. The following information from the patient's medical record supports the medical necessity of this procedure for my patient, consistent with the FDA on-label indication for BAROSTIM NEO:  ? LVEF of  30%   confirmed by Echo on 02/01/21   ? NT-proBNP of <1600 pg/ml  = Pt had a NT-proBNP of 144 on 01/14/21  ?Symptomatic despite medication management of: diuretic, beta blocker, ACEi/ARB/ARNi, and SGLT2 inhibitor as evidenced by symptoms below. Failed MRA with AKI  ? This patients signs and symptoms of heart failure  include "dyspnea with mild to moderate exertion, edema, fatigue, weakness, dizziness,  and < 300 meter distance during a 6-minute walk test   ? NYHA Congestive Heart Failure Classification: III+  ? Recent hospitalization for Heart Failure on (not applicable for this patient)   This patient is NOT indicated for cardiac resynchronization therapy because QRS = 114 by EKG on 01/14/21   Current medicines are reviewed at length with the patient today.    Disposition:   Follow up with EP APP in 6 months. Sooner pending barostim work up   Signed, Shirley Friar, PA-C  03/19/2021 2:45 PM  [place] [address] [place] [address] [phone] (office) [phone] (fax)

## 2021-03-19 NOTE — Progress Notes (Signed)
  Subjective:  Patient ID: [name], male    DOB: 1965-06-28,  MRN: [id number]  Chief Complaint  Patient presents with   Plantar Fasciitis    Follow up   PF left foot    55 y.o. male presents with the above complaint. History confirmed with patient.  Says the injection has not helped at all and feels like it has made it somewhat worse.  He is barely able to walk and he is frustrated that he cannot run  Objective:  Physical Exam: warm, good capillary refill, no trophic changes or ulcerative lesions, normal DP and PT pulses, and normal sensory exam. Left Foot: point tenderness over the heel pad and point tenderness of the mid plantar fascia Right Foot:  Mild edema over dorsal lateral forefoot minimal tenderness to palpation  No images are attached to the encounter.  Radiographs: Multiple views x-ray of both feet: On left foot he has a plantar calcaneal spur and on the right foot he has very mild midtarsal arthritis Assessment:   1. Plantar fasciitis of left foot      Plan:  Patient was evaluated and treated and all questions answered.    Discussed the etiology and treatment options for plantar fasciitis including stretching, formal physical therapy, supportive shoegears such as a running shoe or sneaker, pre fabricated orthoses, injection therapy, and oral medications. We also discussed the role of surgical treatment of this for patients who do not improve after exhausting non-surgical treatment options.   Has not responded to injection therapy.  I recommend mobilization in a CAM boot and he avoid running and exercise at this point.  Rest and begin physical therapy at [facility] which is in a referral to.  He is having his A1c checked this week and will let me know how it is doing we may consider a methylprednisolone taper if does not too high.  Return in about 6 weeks (around 04/29/2021) for recheck plantar fasciitis.

## 2021-03-20 ENCOUNTER — Ambulatory Visit (INDEPENDENT_AMBULATORY_CARE_PROVIDER_SITE_OTHER): Payer: Medicaid Other | Admitting: Cardiology

## 2021-03-20 ENCOUNTER — Other Ambulatory Visit: Payer: Self-pay

## 2021-03-20 ENCOUNTER — Encounter: Payer: Self-pay | Admitting: Cardiology

## 2021-03-20 VITALS — BP 107/72 | HR 66 | Ht 73.0 in | Wt 258.8 lb

## 2021-03-20 DIAGNOSIS — Z95810 Presence of automatic (implantable) cardiac defibrillator: Secondary | ICD-10-CM

## 2021-03-20 DIAGNOSIS — R0789 Other chest pain: Secondary | ICD-10-CM | POA: Diagnosis not present

## 2021-03-20 DIAGNOSIS — I5022 Chronic systolic (congestive) heart failure: Secondary | ICD-10-CM

## 2021-03-20 DIAGNOSIS — I428 Other cardiomyopathies: Secondary | ICD-10-CM

## 2021-03-20 NOTE — Patient Instructions (Signed)

## 2021-03-22 NOTE — Progress Notes (Signed)
EPIC Encounter for ICM Monitoring  Patient Name: DEVONNE SNEIDER is a 55 y.o. male Date: 03/22/2021 Primary Care Physican: [name], MD Primary Cardiologist: Stanford Breed Electrophysiologist: Allred 03/20/2021 Office Weight 258 lbs                                               Transmission reviewed.    [place] thoracic impedance normal fluid levels but was suggesting possible fluid accumulation 9/23-10-3   Prescribed dosage:  Furosemide 80 mg take 1 tablet (80 mg total) twice a day.    Potassium 10 mEq take 1 tablet daily.    Labs: 01/14/2021 Creatinine 1.82, BUN 11, Potassium 3.8, Sodium 143, GFR 43 07/17/2020 Creatinine 1.66, BUN 13, Potassium 3.7, Sodium 142, GFR 46-53 Complete set of lab results in result review.    Recommendations:  No changes.    Follow-up plan: ICM clinic phone appointment on 04/22/2021.   91 day device clinic remote transmission 05/24/2021.     EP/Cardiology Office Visits: Recall 09/16/2021 with [name].  Recall 09/15/2021 with Oda Kilts, PA   Copy of ICM check sent to Dr. Rayann Heman.    3 month ICM trend: 03/19/2021.    1 Year ICM trend:       [name], RN 03/22/2021 3:07 PM

## 2021-03-28 ENCOUNTER — Telehealth: Payer: Self-pay | Admitting: *Deleted

## 2021-03-28 NOTE — Telephone Encounter (Signed)
Patient is calling because his feet are killing him after his injection and given a boot which causes the injection to hurt. Please advise.

## 2021-04-02 NOTE — Telephone Encounter (Signed)
Returned the call to patient giving  Dr Maxie Barb recommendations,verbalized understanding.

## 2021-04-08 ENCOUNTER — Encounter: Payer: Self-pay | Admitting: Internal Medicine

## 2021-04-08 ENCOUNTER — Ambulatory Visit: Payer: Medicaid Other | Attending: Internal Medicine | Admitting: Internal Medicine

## 2021-04-08 ENCOUNTER — Other Ambulatory Visit: Payer: Self-pay

## 2021-04-08 DIAGNOSIS — J029 Acute pharyngitis, unspecified: Secondary | ICD-10-CM

## 2021-04-08 MED ORDER — AMOXICILLIN 500 MG PO CAPS: 500.0000 mg | ORAL_CAPSULE | Freq: Three times a day (TID) | ORAL | 0 refills | Status: DC

## 2021-04-08 NOTE — Progress Notes (Signed)
Patient ID: [name], male   DOB: [birth year], 55 y.o.   MRN: [id number] Virtual Visit via Telephone Note  I connected with [name] on 04/08/2021 at 2:44 p.m by telephone and verified that I am speaking with the correct person using two identifiers  Location: Patient: home Provider: office  Participants: Myself Patient   I discussed the limitations, risks, security and privacy concerns of performing an evaluation and management service by telephone and the availability of in person appointments. I also discussed with the patient that there may be a patient responsible charge related to this service. The patient expressed understanding and agreed to proceed.   History of Present Illness: Patient with history of NICM, systolic CHF s/p ICD placement, DM type 2, HL, CKD stage 3,  obesity, BPH, GERD and chronic LBP secondary to spondylosis and spinal stenosis, COVID-19 infection. This is an UC visit.  Pt had sent me a Mychart message today requesting abx for sore throat.  Could not come in to be seen because of recent death in family.  Patient complains of sore throat x 3 days.  Thinks he has strep throat No associated fever, cough or congestion.  Has chronic SOB Throat is red and feels swollen.  He is not sure about any white exudates on the tonsils. Recently was in contact of 2 persons who had acute sinus issues   Observations/Objective: No direct observation done  Assessment and Plan: 1. Sore throat Will treat treat empirically for strep throat. Advised that he should also consider doing a home COVID test. - amoxicillin (AMOXIL) 500 MG capsule; Take 1 capsule (500 mg total) by mouth 3 (three) times daily.  Dispense: 21 capsule; Refill: 0   Follow Up Instructions: As needed.   I discussed the assessment and treatment plan with the patient. The patient was provided an opportunity to ask questions and all were answered. The patient agreed with the plan and demonstrated an  understanding of the instructions.   The patient was advised to call back or seek an in-person evaluation if the symptoms worsen or if the condition fails to improve as anticipated.  I  Spent 6 minutes on this telephone encounter  Karle Plumber, MD

## 2021-04-11 ENCOUNTER — Other Ambulatory Visit: Payer: Self-pay

## 2021-04-11 DIAGNOSIS — I5042 Chronic combined systolic (congestive) and diastolic (congestive) heart failure: Secondary | ICD-10-CM

## 2021-04-11 DIAGNOSIS — I519 Heart disease, unspecified: Secondary | ICD-10-CM

## 2021-04-11 DIAGNOSIS — I428 Other cardiomyopathies: Secondary | ICD-10-CM

## 2021-04-12 ENCOUNTER — Other Ambulatory Visit: Payer: Self-pay | Admitting: Cardiology

## 2021-04-12 DIAGNOSIS — I519 Heart disease, unspecified: Secondary | ICD-10-CM

## 2021-04-12 DIAGNOSIS — I428 Other cardiomyopathies: Secondary | ICD-10-CM

## 2021-04-12 DIAGNOSIS — I5042 Chronic combined systolic (congestive) and diastolic (congestive) heart failure: Secondary | ICD-10-CM

## 2021-04-12 MED ORDER — ENTRESTO 24-26 MG PO TABS: 1.0000 | ORAL_TABLET | Freq: Two times a day (BID) | ORAL | 3 refills | Status: DC

## 2021-04-12 MED ORDER — FUROSEMIDE 80 MG PO TABS: 80.0000 mg | ORAL_TABLET | Freq: Two times a day (BID) | ORAL | 3 refills | Status: DC

## 2021-04-12 NOTE — Telephone Encounter (Signed)
Please see previous encounter

## 2021-04-17 ENCOUNTER — Other Ambulatory Visit: Payer: Self-pay | Admitting: *Deleted

## 2021-04-17 ENCOUNTER — Other Ambulatory Visit: Payer: Self-pay

## 2021-04-17 DIAGNOSIS — Z789 Other specified health status: Secondary | ICD-10-CM

## 2021-04-17 NOTE — Patient Outreach (Signed)
Medicaid Managed Care   Nurse Care Manager Note  04/17/2021 Name:  [name] MRN:  [id number] DOB:  August 12, 1965  [name] is an 55 y.o. year old male who is a primary patient of [name], MD.  The [facility] Managed Care Coordination team was consulted for assistance with:    DMII  [name] was given information about Medicaid Managed Care Coordination team services today. [name] Patient agreed to services and verbal consent obtained.  Engaged with patient by telephone for follow up visit in response to provider referral for case management and/or care coordination services.   Assessments/Interventions:  Review of past medical history, allergies, medications, health status, including review of consultants reports, laboratory and other test data, was performed as part of comprehensive evaluation and provision of chronic care management services.  SDOH (Social Determinants of Health) assessments and interventions performed: SDOH Interventions    Flowsheet Row Most Recent Value  SDOH Interventions   Housing Interventions Intervention Not Indicated       Care Plan  Allergies  Allergen Reactions   Bee Venom Anaphylaxis    Medications Reviewed Today     Reviewed by Melissa Montane, RN (Registered Nurse) on 04/17/21 at 20  Med List Status: <None>   Medication Order Taking? Sig Documenting Provider Last Dose Status Informant  ACCU-CHEK FASTCLIX LANCETS [place] [id number] Yes Use to check blood sugar 3 times daily. [name], MD Taking Active   amoxicillin (AMOXIL) 500 MG capsule [id number] No Take 1 capsule (500 mg total) by mouth 3 (three) times daily.  Patient not taking: Reported on 04/17/2021   [name], MD Not Taking Active            Med Note Thamas Jaegers, MELANIE A   Wed Apr 17, 2021  3:59 PM) completed  aspirin 81 MG tablet [id number] Yes Take 81 mg by mouth daily. [provider] Taking Active Self  atorvastatin (LIPITOR) 10 MG  tablet [id number] Yes Take 1 tablet (10 mg total) by mouth daily. [name], MD Taking Active   carvedilol (COREG) 6.25 MG tablet [id number] Yes Take 1 tablet by mouth twice daily Lelon Perla, MD Taking Active   cetirizine (ZYRTEC) 10 MG tablet [id number] Yes Take 1 tablet by mouth once daily Garnet Sierras, DO Taking Active   dapagliflozin propanediol (FARXIGA) 5 MG TABS tablet [id number] Yes Take 1 tablet (5 mg total) by mouth daily before breakfast. [name], MD Taking Active   EPINEPHrine 0.3 mg/0.3 mL IJ SOAJ injection [id number]  Inject 0.3 mg into the muscle as needed. Garnet Sierras, DO  Active   famotidine (PEPCID) 20 MG tablet [id number] Yes Take 1 tablet by mouth once daily Garnet Sierras, DO Taking Active   fluticasone [facility]) 50 MCG/ACT nasal spray [id number] Yes Use 1-2 sprays in each nostril once a day as needed for nasal congestion. Garnet Sierras, DO Taking Active   furosemide (LASIX) 80 MG tablet [id number] Yes Take 1 tablet (80 mg total) by mouth 2 (two) times daily. Lelon Perla, MD Taking Active   gabapentin (NEURONTIN) 300 MG capsule [id number] Yes TAKE 1 CAPSULE BY MOUTH EVERY DAY AT BEDTIME [name], MD Taking Active   gabapentin (NEURONTIN) 300 MG capsule [id number] Yes Take 1 capsule (300 mg total) by mouth at bedtime. [name], MD Taking Active   glucose blood (ACCU-CHEK GUIDE) test strip [id number]  Use as instructed to check blood sugar 3  times daily. [name], MD  Active   Lancet Devices [name]) lancets [id number]  1 each by Other route 3 (three) times daily. Boykin Nearing, MD  Active Self  levalbuterol [facility] HFA) 45 MCG/ACT inhaler [id number] Yes Inhale 2 puffs into the lungs every 6 (six) hours as needed for wheezing or shortness of breath. Garnet Sierras, DO Taking Active   methocarbamol (ROBAXIN) 500 MG tablet [id number] Yes Take 1 tablet (500 mg total) by mouth daily as needed for muscle spasms. [name], MD  Taking Active   mometasone (ELOCON) 0.1 % cream [id number] Yes Apply 1 application topically daily. Marzetta Board, DPM Taking Active   potassium chloride (KLOR-CON) 10 MEQ tablet [id number] Yes Take 10 mEq by mouth daily. [provider] Taking Active   sacubitril-valsartan (ENTRESTO) 24-26 MG [id number] Yes Take 1 tablet by mouth 2 (two) times daily. Lelon Perla, MD Taking Active   sildenafil (VIAGRA) 100 MG tablet [id number] Yes Take 1-2 tabs PO 1/2-1 hr prior to intercourse. [name], MD Taking Active   sitaGLIPtin (JANUVIA) 25 MG tablet [id number] Yes Take 1 tablet (25 mg total) by mouth daily. [name], MD Taking Active   tamsulosin [facility]) 0.4 MG CAPS capsule [id number] Yes Take 2 capsules (0.8 mg total) by mouth daily. [name], MD Taking Active   triamcinolone cream (KENALOG) 0.1 % [id number] Yes Apply 1 application topically 2 (two) times daily as needed. Do not use on the face, neck, armpits or groin area. Do not use more than 3 weeks in a row. Garnet Sierras, DO Taking Active             Patient Active Problem List   Diagnosis Date Noted   CKD (chronic kidney disease) stage 2, GFR 60-89 ml/min 10/24/2019   Acute non-recurrent frontal sinusitis 06/27/2019   COVID-19 virus infection 06/27/2019   Glaucoma suspect 12/22/2018   Seasonal and perennial allergic rhinoconjunctivitis 10/25/2018   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   Urticaria 05/03/2018   S/P inguinal hernia repair 03/01/2018   Unilateral inguinal hernia without obstruction or gangrene 07/24/2017   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   Hypersomnia 02/28/2016   Abnormal PFT 02/24/2016   Shortness of breath 01/28/2016   Diabetic polyneuropathy associated with type 2 diabetes mellitus ([place]) 12/18/2015   Lumbar back pain 12/18/2015   Controlled type 2 diabetes mellitus with complication, with long-term current use of insulin ([place]) 09/17/2015    Rash and other nonspecific skin eruption 05/14/2015   Left median nerve neuropathy 04/16/2015   Lesion of right median nerve at forearm 04/16/2015   ICD (implantable cardioverter-defibrillator) in place 08/07/3141   Chronic systolic congestive heart failure ([place]) 03/28/2014   Nonischemic cardiomyopathy ([place]) 11/09/2013   Syncope 10/20/2013   Chronic chest pain 07/28/2013   RENAL CALCULUS 05/13/2009   Obesity 05/08/2009   Former heavy cigarette smoker (20-39 per day) 05/08/2009   HOARSENESS 05/08/2009   ANXIETY 02/28/2008   ASTHMATIC BRONCHITIS, ACUTE 02/28/2008   GERD 02/28/2008   IRRITABLE BOWEL SYNDROME 02/28/2008    Conditions to be addressed/monitored per PCP order:  DMII  Care Plan : Diabetes Type 2 (Adult)  Updates made by Melissa Montane, RN since 04/17/2021 12:00 AM  Completed 04/17/2021   Problem: Glycemic Management (Diabetes, Type 2) Resolved 04/17/2021  Note:   Resolving due to duplicate goal     Long-Range Goal: Glycemic Management Optimized Completed 04/17/2021  Start Date: 06/14/2020  Expected End  Date: 04/11/2021  Recent Progress: On track  Priority: High  Note:   CARE PLAN ENTRY Medicaid Managed Care (see longtitudinal plan of care for additional care plan information)  Objective:  Lab Results  Component Value Date   HGBA1C 8.2 (H) 07/23/2020   Lab Results  Component Value Date   CREATININE 1.82 (H) 01/14/2021   CREATININE 1.66 (H) 07/17/2020   CREATININE 1.51 (H) 10/14/2019   Lab Results  Component Value Date   EGFR 43 (L) 01/14/2021   Patient reported cbg findings: Checking BS at random times-100's  Current Barriers:  Knowledge Deficits related to basic Diabetes pathophysiology and self care/management-Mr Bernard is checking his blood sugar randomly, he does not like to prick his finger due to the pain. He reports that his A1C was high because he has not been able to exercise like he was, due to [place] the park. He plans to increase  his exercise when he feels better. He reports eating a healthy diabetic diet. Mr. Lotter is exercising everyday, watching his diet, and taking his medications as directed. He does not have a follow up PCP appointment scheduled. Mr. Brumett is needing some dental work and needs a Financial risk analyst. [place] has not found a dental provider accepting his insurance. He reports worsening heart failure, recent ejection fraction 30%. PCP and Cardiology follow up scheduled for next week, he is aware. Resolving due to duplicate goal  Case Manager Clinical Goal(s):  Over the next 60 days, patient will demonstrate improved adherence to prescribed treatment plan for diabetes self care/management as evidenced by:  daily monitoring and recording of CBG adherence to ADA/ carb modified diet adherence to prescribed medication regimen Increase exercise as tolerated, working up to 5-7 days a week for 30 min Interventions:  Provided education on diabetic and heart healthy diet to patient Advised patient to exercise 30 min a day for 5 days a week Discussed plans with patient for ongoing care management follow up and provided patient with direct contact information for care management team Reviewed scheduled/upcoming provider appointments, patient has transportation to these appointments Review of patient status, including review of consultants reports, relevant laboratory and other test results, and medications completed. Assisted with finding dental provider accepting Medicaid, call made to [place] [phone] Patient Self Care Activities:  - adhere to a diabetic diet - make and attend a follow up appointment with your PCP-03/19/21 - check blood sugar at prescribed times-check 3 times a day - check blood sugar if I feel it is too high or too low - enter blood sugar readings and medication or insulin into daily log - take the blood sugar log to all doctor visits - take the blood sugar  meter to all doctor visits Please see past updates related to this goal by clicking on the "Past Updates" button in the selected goal RNCM will follow up with patient to assess new needs or barriers to managing his health on 04/11/21 @ 3:45pm     Care Plan : [place] of Care  Updates made by Melissa Montane, RN since 04/17/2021 12:00 AM     Problem: Chronic Disease Management and Care Coordination needs in Patient with DMII   Priority: High     Long-Range Goal: Establish plan of care for chronic disease management and care coordination in patient with DMII   Start Date: 04/17/2021  Expected End Date: 06/16/2021  Priority: High  Note:   Current Barriers:  Care Coordination needs  related to Level of care concerns  Chronic Disease Management support and education needs related to DMII  RNCM Clinical Goal(s):  Patient will verbalize understanding of plan for management of DMII as evidenced by compliance with diabetes management take all medications exactly as prescribed and will call provider for medication related questions as evidenced by medication compliance    attend all scheduled medical appointments: appointments listed in [place] as evidenced by physician notes in EMR        demonstrate ongoing adherence to prescribed treatment plan for DMII as evidenced by improving labs  through collaboration with RN Care manager, provider, and care team.   Interventions: Inter-disciplinary care team collaboration (see longitudinal plan of care) Evaluation of current treatment plan related to  self management and patient's adherence to plan as established by provider   Diabetes:  (Status: New goal.) Long Term Goal   Lab Results  Component Value Date   HGBA1C 8.9 (A) 03/19/2021  Assessed patient's understanding of A1c goal: <7% Provided education to patient about basic DM disease process; Reviewed medications with patient and discussed importance of medication adherence;         Reviewed prescribed diet with patient diabetic diet; Discussed plans with patient for ongoing care management follow up and provided patient with direct contact information for care management team;      Reviewed scheduled/upcoming provider appointments including: PCP January 2023 ;         call provider for findings outside established parameters;       Referral made to community resources care guide team for assistance with dental provider;      Review of patient status, including review of consultants reports, relevant laboratory and other test results, and medications completed;       Assessed social determinant of health barriers;         Patient Goals/Self-Care Activities: check blood sugar at prescribed times: once daily enter blood sugar readings and medication or insulin into daily log take the blood sugar log to all doctor visits fill half of plate with vegetables manage portion size       Follow Up:  Patient agrees to Care Plan and Follow-up.  Plan: The Managed Medicaid care management team will reach out to the patient again over the next 30 days.  Date/time of next scheduled RN care management/care coordination outreach:  05/17/21 @ 3:45pm  Lurena Joiner RN, BSN [place]  [employer] manager

## 2021-04-17 NOTE — Patient Instructions (Signed)
Visit Information  [name] was given information about Medicaid Managed Care team care coordination services as a part of their [place] Medicaid benefit. [name] verbally consented to engagement with the [facility] Managed Care team.   If you are experiencing a medical emergency, please call 911 or report to your local emergency department or urgent care.   If you have a non-emergency medical problem during routine business hours, please contact your provider's office and ask to speak with a nurse.   For questions related to your [facility], please call: [phone] or visit the homepage here: [email or web address]  If you would like to schedule transportation through your [facility], please call the following number at least 2 days in advance of your appointment: [phone].   Call the [place] at [phone], at any time, 24 hours a day, 7 days a week. If you are in danger or need immediate medical attention call 911.  If you would like help to quit smoking, call 1-800-QUIT-NOW 901-097-5975) OR Espaol: 1-855-Djelo-Ya (4-132-440-1027) o para ms informacin haga clic aqu or Text READY to 200-400 to register via text  [name] - following are the goals we discussed in your visit today:   Goals Addressed             This Visit's Progress    COMPLETED: Monitor and Manage My Blood Sugar-Diabetes Type 2       Resolving due to duplicate goal  Timeframe:  Short-Term Goal Priority:  High Start Date:  06/14/20                           Expected End Date:  04/11/21   Follow up 04/11/21                   - adhere to a diabetic diet - make and attend a follow up appointment with your PCP-03/19/21 - check blood sugar at prescribed times-check 3 times a day - check blood sugar if I feel it is too high or too low - enter blood  sugar readings and medication or insulin into daily log - take the blood sugar log to all doctor visits - take the blood sugar meter to all doctor visits    Why is this important?   Checking your blood sugar at home helps to keep it from getting very high or very low.  Writing the results in a diary or log helps the doctor know how to care for you.  Your blood sugar log should have the time, date and the results.  Also, write down the amount of insulin or other medicine that you take.  Other information, like what you ate, exercise done and how you were feeling, will also be helpful.          COMPLETED: Set My Target A1C-Diabetes Type 2       Resolving due to duplicate goal  Timeframe:  Long-Range Goal Priority:  Medium Start Date:  06/14/20                           Expected End Date:  04/11/21     Follow up 04/11/21                 - set target A1C    Why is this important?   Your target A1C is  decided together by you and your doctor.  It is based on several things like your age and other health issues.            Please see education materials related to root canal and carbohydrate counting provided by MyChart link.  Patient has access to MyChart and can view provided education  Telephone follow up appointment with Managed Medicaid care management team member scheduled for:05/17/21 @ 3:45pm  Lurena Joiner RN, BSN [place] RN Care Coordinator   Following is a copy of your plan of care:  Care Plan : Diabetes Type 2 (Adult)  Updates made by Melissa Montane, RN since 04/17/2021 12:00 AM  Completed 04/17/2021   Problem: Glycemic Management (Diabetes, Type 2) Resolved 04/17/2021  Note:   Resolving due to duplicate goal     Long-Range Goal: Glycemic Management Optimized Completed 04/17/2021  Start Date: 06/14/2020  Expected End Date: 04/11/2021  Recent Progress: On track  Priority: High  Note:   CARE PLAN ENTRY Medicaid Managed Care (see  longtitudinal plan of care for additional care plan information)  Objective:  Lab Results  Component Value Date   HGBA1C 8.2 (H) 07/23/2020   Lab Results  Component Value Date   CREATININE 1.82 (H) 01/14/2021   CREATININE 1.66 (H) 07/17/2020   CREATININE 1.51 (H) 10/14/2019   Lab Results  Component Value Date   EGFR 43 (L) 01/14/2021   Patient reported cbg findings: Checking BS at random times-100's  Current Barriers:  Knowledge Deficits related to basic Diabetes pathophysiology and self care/management-[name] is checking his blood sugar randomly, he does not like to prick his finger due to the pain. He reports that his A1C was high because he has not been able to exercise like he was, due to [place] the park. He plans to increase his exercise when he feels better. He reports eating a healthy diabetic diet. [name] is exercising everyday, watching his diet, and taking his medications as directed. He does not have a follow up PCP appointment scheduled. [name] is needing some dental work and needs a Financial risk analyst. [name] has not found a dental provider accepting his insurance. He reports worsening heart failure, recent ejection fraction 30%. PCP and Cardiology follow up scheduled for next week, he is aware. Resolving due to duplicate goal  Case Manager Clinical Goal(s):  Over the next 60 days, patient will demonstrate improved adherence to prescribed treatment plan for diabetes self care/management as evidenced by:  daily monitoring and recording of CBG adherence to ADA/ carb modified diet adherence to prescribed medication regimen Increase exercise as tolerated, working up to 5-7 days a week for 30 min Interventions:  Provided education on diabetic and heart healthy diet to patient Advised patient to exercise 30 min a day for 5 days a week Discussed plans with patient for ongoing care management follow up and provided patient with direct contact information for  care management team Reviewed scheduled/upcoming provider appointments, patient has transportation to these appointments Review of patient status, including review of consultants reports, relevant laboratory and other test results, and medications completed. Assisted with finding dental provider accepting Medicaid, call made to [place] [phone] Patient Self Care Activities:  - adhere to a diabetic diet - make and attend a follow up appointment with your PCP-03/19/21 - check blood sugar at prescribed times-check 3 times a day - check blood sugar if I feel it is too high or too low -  enter blood sugar readings and medication or insulin into daily log - take the blood sugar log to all doctor visits - take the blood sugar meter to all doctor visits Please see past updates related to this goal by clicking on the "Past Updates" button in the selected goal RNCM will follow up with patient to assess new needs or barriers to managing his health on 04/11/21 @ 3:45pm     Care Plan : [place] of Care  Updates made by Melissa Montane, RN since 04/17/2021 12:00 AM     Problem: Chronic Disease Management and Care Coordination needs in Patient with DMII   Priority: High     Long-Range Goal: Establish plan of care for chronic disease management and care coordination in patient with DMII   Start Date: 04/17/2021  Expected End Date: 06/16/2021  Priority: High  Note:   Current Barriers:  Care Coordination needs related to Level of care concerns  Chronic Disease Management support and education needs related to DMII  RNCM Clinical Goal(s):  Patient will verbalize understanding of plan for management of DMII as evidenced by compliance with diabetes management take all medications exactly as prescribed and will call provider for medication related questions as evidenced by medication compliance    attend all scheduled medical appointments: appointments listed in  [place] as evidenced by physician notes in EMR        demonstrate ongoing adherence to prescribed treatment plan for DMII as evidenced by improving labs  through collaboration with RN Care manager, provider, and care team.   Interventions: Inter-disciplinary care team collaboration (see longitudinal plan of care) Evaluation of current treatment plan related to  self management and patient's adherence to plan as established by provider   Diabetes:  (Status: New goal.) Long Term Goal   Lab Results  Component Value Date   HGBA1C 8.9 (A) 03/19/2021  Assessed patient's understanding of A1c goal: <7% Provided education to patient about basic DM disease process; Reviewed medications with patient and discussed importance of medication adherence;        Reviewed prescribed diet with patient diabetic diet; Discussed plans with patient for ongoing care management follow up and provided patient with direct contact information for care management team;      Reviewed scheduled/upcoming provider appointments including: PCP January 2023 ;         call provider for findings outside established parameters;       Referral made to community resources care guide team for assistance with dental provider;      Review of patient status, including review of consultants reports, relevant laboratory and other test results, and medications completed;       Assessed social determinant of health barriers;         Patient Goals/Self-Care Activities: check blood sugar at prescribed times: once daily enter blood sugar readings and medication or insulin into daily log take the blood sugar log to all doctor visits fill half of plate with vegetables manage portion size

## 2021-04-22 ENCOUNTER — Ambulatory Visit (INDEPENDENT_AMBULATORY_CARE_PROVIDER_SITE_OTHER): Payer: Medicaid Other

## 2021-04-22 DIAGNOSIS — I5022 Chronic systolic (congestive) heart failure: Secondary | ICD-10-CM

## 2021-04-22 DIAGNOSIS — Z95810 Presence of automatic (implantable) cardiac defibrillator: Secondary | ICD-10-CM

## 2021-04-25 ENCOUNTER — Telehealth: Payer: Self-pay

## 2021-04-25 NOTE — Telephone Encounter (Signed)
   Telephone encounter was:  Successful.  04/25/2021 Name: [name] MRN: [id number] DOB: June 11, 1965  [name] is a 55 y.o. year old male who is a primary care patient of [name], MD . The community resource team was consulted for assistance with  dental providers.  Care guide performed the following interventions: Patient provided with information about care guide support team and interviewed to confirm resource needs  Spoke with patient he stated his insurance has already given him a list of dental providers, but they do not perform root canals. Since there is no copy of the insurance card on file [name] will call me back with the information on his card.  Follow Up Plan:  Care guide will follow up with patient by phone over the next 6-00 days  Tavon Corriher, AAS Paralegal, [place] [facility]  [address], [place] [address] ??[email or web address]  ?? [phone]   [email or web address]

## 2021-04-26 NOTE — Progress Notes (Signed)
EPIC Encounter for ICM Monitoring  Patient Name: [name] is a 55 y.o. male Date: 04/26/2021 Primary Care Physican: [name], MD Primary Cardiologist: Stanford Breed Electrophysiologist: Allred 03/20/2021 Office Weight 258 lbs                                               Transmission reviewed.    Corvue thoracic impedance normal fluid levels.   Prescribed dosage:  Furosemide 80 mg take 1 tablet (80 mg total) twice a day.    Potassium 10 mEq take 1 tablet daily.    Labs: 01/14/2021 Creatinine 1.82, BUN 11, Potassium 3.8, Sodium 143, GFR 43 07/17/2020 Creatinine 1.66, BUN 13, Potassium 3.7, Sodium 142, GFR 46-53 Complete set of lab results in result review.    Recommendations:  No changes.    Follow-up plan: ICM clinic phone appointment on 05/27/2021.   91 day device clinic remote transmission 05/24/2021.     EP/Cardiology Office Visits: Recall 09/16/2021 with [name].  Recall 09/15/2021 with Oda Kilts, PA   Copy of ICM check sent to Dr. Rayann Heman.    3 month ICM trend: 04/22/2021.    [name], RN 04/26/2021 8:27 AM

## 2021-04-29 ENCOUNTER — Ambulatory Visit (INDEPENDENT_AMBULATORY_CARE_PROVIDER_SITE_OTHER): Payer: Medicaid Other | Admitting: Podiatry

## 2021-04-29 DIAGNOSIS — Z91199 Patient's noncompliance with other medical treatment and regimen due to unspecified reason: Secondary | ICD-10-CM

## 2021-04-29 NOTE — Progress Notes (Signed)
Patient no show for appt

## 2021-05-10 ENCOUNTER — Telehealth: Payer: Self-pay

## 2021-05-10 NOTE — Telephone Encounter (Signed)
   Telephone encounter was:  Successful.  05/10/2021 Name: [name] MRN: [id number] DOB: [birth year]  [name] is a 55 y.o. year old male who is a primary care patient of [name], MD . The community resource team was consulted for assistance with  dental provider.  Care guide performed the following interventions: Spoke with patient his dentist office can see him now and will accept his insurance.  Emailed information for patient to contact his insurance provider and Clinical biochemist.  Follow Up Plan:  No further follow up planned at this time. The patient has been provided with needed resources.  Davita Sublett, AAS Paralegal, [facility]  [address], [place] [address] ??[email or web address]  ?? [phone]   [email or web address]

## 2021-05-15 ENCOUNTER — Encounter: Payer: Self-pay | Admitting: Allergy

## 2021-05-15 ENCOUNTER — Other Ambulatory Visit: Payer: Self-pay

## 2021-05-15 ENCOUNTER — Ambulatory Visit (INDEPENDENT_AMBULATORY_CARE_PROVIDER_SITE_OTHER): Payer: Medicaid Other | Admitting: Allergy

## 2021-05-15 DIAGNOSIS — T63481D Toxic effect of venom of other arthropod, accidental (unintentional), subsequent encounter: Secondary | ICD-10-CM | POA: Diagnosis not present

## 2021-05-15 DIAGNOSIS — J302 Other seasonal allergic rhinitis: Secondary | ICD-10-CM

## 2021-05-15 DIAGNOSIS — L509 Urticaria, unspecified: Secondary | ICD-10-CM

## 2021-05-15 DIAGNOSIS — H1010 Acute atopic conjunctivitis, unspecified eye: Secondary | ICD-10-CM | POA: Diagnosis not present

## 2021-05-15 DIAGNOSIS — T782XXD Anaphylactic shock, unspecified, subsequent encounter: Secondary | ICD-10-CM | POA: Diagnosis not present

## 2021-05-15 DIAGNOSIS — J3089 Other allergic rhinitis: Secondary | ICD-10-CM

## 2021-05-15 DIAGNOSIS — R0602 Shortness of breath: Secondary | ICD-10-CM

## 2021-05-15 NOTE — Assessment & Plan Note (Signed)
Past history - Rash/hives for 3-4 years but worse the last 7 months now having flat rash lasting up to 1 day. Describes them as pruritic and occurring on a daily basis. No triggers identified. Tried benadryl, zyrtec and Claritin with no benefit. Used oral steroids with minimal benefit. Interim history - only had 2 flares within the past 6 months.   Continue Zyrtec (cetrizine) 10mg  once a day at night.   May take twice a day during flares.  Continue Pepcid (famotidine) 20mg  once a day in the morning.  May take twice a day during flares.   Take pictures when it flares.  Avoid the following potential triggers: alcohol, tight clothing, NSAIDs.

## 2021-05-15 NOTE — Assessment & Plan Note (Signed)
No inhaler use. He thinks it's from his CHF.  Monitor symptoms.   May use levoalbuterol rescue inhaler 2 puffs every 4 to 6 hours as needed for shortness of breath, chest tightness, coughing, and wheezing. Monitor frequency of use.   Get spirometry at next visit.

## 2021-05-15 NOTE — Assessment & Plan Note (Signed)
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 Most recently got stung a few years ago which required epinephrine use. Bloodwork was borderline positive to paper wasp. Interim history - No stings since last visit.   Avoid insect stings.  For mild symptoms you can take over the counter antihistamines such as Benadryl and monitor symptoms closely. If symptoms worsen or if you have severe symptoms including breathing issues, throat closure, significant swelling, whole body hives, severe diarrhea and vomiting, lightheadedness then inject epinephrine and seek immediate medical care afterwards.

## 2021-05-15 NOTE — Assessment & Plan Note (Signed)
Past history - Bloodwork was positive to dust mites, cat, dog, grass. Borderline positive to mold, cockroach and tree pollen. Interim history - controlled with zyrtec.  Continue zyrtec 10mg  daily.  May use Flonase 1-2 sprays in each nostril once a day as needed for nasal symptoms.   Continue environmental control measures.

## 2021-05-15 NOTE — Patient Instructions (Addendum)
Urticaria Continue Zyrtec (ceterizine) 10mg  once a day at night.  May take twice a day during flares. Continue Pepcid (famotidine) 20mg  once a day in the morning. May take twice a day during flares.  Take pictures when it flares. Avoid the following potential triggers: alcohol, tight clothing, NSAIDs.    Seasonal and perennial allergic rhinoconjunctivitis Bloodwork was positive to dust mites, cat, dog, grass. Borderline positive to mold, cockroach and tree pollen. Continue zyrtec 10mg  daily. May use Flonase 1-2 sprays in each nostril once a day as needed for nasal symptoms.  Continue environmental control measures.    Anaphylaxis due to hymenoptera venom Avoid insect stings. For mild symptoms you can take over the counter antihistamines such as Benadryl and monitor symptoms closely. If symptoms worsen or if you have severe symptoms including breathing issues, throat closure, significant swelling, whole body hives, severe diarrhea and vomiting, lightheadedness then inject epinephrine and seek immediate medical care afterwards.  Shortness of breath Monitor symptoms.   Follow up in 6 months or sooner if needed.   Reducing Pollen Exposure Pollen seasons: trees (spring), grass (summer) and ragweed/weeds (fall). Keep windows closed in your home and car to lower pollen exposure.  Install air conditioning in the bedroom and throughout the house if possible.  Avoid going out in dry windy days - especially early morning. Pollen counts are highest between 5 - 10 AM and on dry, hot and windy days.  Save outside activities for late afternoon or after a heavy rain, when pollen levels are lower.  Avoid mowing of grass if you have grass pollen allergy. Be aware that pollen can also be transported indoors on people and pets.  Dry your clothes in an automatic dryer rather than hanging them outside where they might collect pollen.  Rinse hair and eyes before bedtime. Control of House Dust Mite  Allergen Dust mite allergens are a common trigger of allergy and asthma symptoms. While they can be found throughout the house, these microscopic creatures thrive in warm, humid environments such as bedding, upholstered furniture and carpeting. Because so much time is spent in the bedroom, it is essential to reduce mite levels there.  Encase pillows, mattresses, and box springs in special allergen-proof fabric covers or airtight, zippered plastic covers.  Bedding should be washed weekly in hot water (130 F) and dried in a hot dryer. Allergen-proof covers are available for comforters and pillows that can't be regularly washed.  Wash the allergy-proof covers every few months. Minimize clutter in the bedroom. Keep pets out of the bedroom.  Keep humidity less than 50% by using a dehumidifier or air conditioning. You can buy a humidity measuring device called a hygrometer to monitor this.  If possible, replace carpets with hardwood, linoleum, or washable area rugs. If that's not possible, vacuum frequently with a vacuum that has a HEPA filter. Remove all upholstered furniture and non-washable window drapes from the bedroom. Remove all non-washable stuffed toys from the bedroom.  Wash stuffed toys weekly. Pet Allergen Avoidance: Contrary to popular opinion, there are no "hypoallergenic" breeds of dogs or cats. That is because people are not allergic to an animal's hair, but to an allergen found in the animal's saliva, dander (dead skin flakes) or urine. Pet allergy symptoms typically occur within minutes. For some people, symptoms can build up and become most severe 8 to 12 hours after contact with the animal. People with severe allergies can experience reactions in public places if dander has been transported on the pet owners'  clothing. Keeping an animal outdoors is only a partial solution, since homes with pets in the yard still have higher concentrations of animal allergens. Before getting a pet, ask  your allergist to determine if you are allergic to animals. If your pet is already considered part of your family, try to minimize contact and keep the pet out of the bedroom and other rooms where you spend a great deal of time. As with dust mites, vacuum carpets often or replace carpet with a hardwood floor, tile or linoleum. High-efficiency particulate air (HEPA) cleaners can reduce allergen levels over time. While dander and saliva are the source of cat and dog allergens, urine is the source of allergens from rabbits, hamsters, mice and [place] pigs; so ask a non-allergic family member to clean the animal's cage. If you have a pet allergy, talk to your allergist about the potential for allergy immunotherapy (allergy shots). This strategy can often provide long-term relief.

## 2021-05-15 NOTE — Progress Notes (Signed)
RE: [name] MRN: [id number] DOB: March 05, 1966 Date of Telemedicine Visit: 05/15/2021  Referring provider: [name], MD Primary care provider: [name], MD  Chief Complaint: Urticaria (Follow up)   Telemedicine Follow Up Visit via Telephone: I connected with [name] for a follow up on 05/15/21 by telephone and verified that I am speaking with the correct person using two identifiers.   I discussed the limitations, risks, security and privacy concerns of performing an evaluation and management service by telephone and the availability of in person appointments. I also discussed with the patient that there may be a patient responsible charge related to this service. The patient expressed understanding and agreed to proceed.  Patient is at home/work. Provider is at the office.  Visit start time: 4:42PM Visit end time: 4:53PM Insurance consent/check in by: front desk Medical consent and medical assistant/nurse: Trayce J.  History of Present Illness: He is a 55 y.o. male, who is being followed for urticaria, allergic rhinoconjunctivitis, hymenoptera allergy, shortness of breath and rash. His previous allergy office visit was on 11/12/2020 with Dr. Maudie Mercury. Today is a regular follow up visit.  Urticaria Having less major outbreaks and only had 2 outbreaks since the last visit. Symptoms resolved within a day without any additional medications.  Taking zyrtec 10mg  daily and famotidine 20mg  daily with good benefit.   Seasonal and perennial allergic rhinoconjunctivitis Stable with zyrtec daily.  Only using Flonase on rare occasions.  Anaphylaxis due to hymenoptera venom No stings since the last visit.   Shortness of breath Patient was told that this is from his congestive heart failure. No albuterol use.   Assessment and Plan: [name] is a 55 y.o. male with: Urticaria Past history - Rash/hives for 3-4 years but worse the last 7 months now having flat rash  lasting up to 1 day. Describes them as pruritic and occurring on a daily basis. No triggers identified. Tried benadryl, zyrtec and Claritin with no benefit. Used oral steroids with minimal benefit. Interim history - only had 2 flares within the past 6 months.  Continue Zyrtec (cetrizine) 10mg  once a day at night.  May take twice a day during flares. Continue Pepcid (famotidine) 20mg  once a day in the morning. May take twice a day during flares.  Take pictures when it flares. Avoid the following potential triggers: alcohol, tight clothing, NSAIDs.   Seasonal and perennial allergic rhinoconjunctivitis Past history - Bloodwork was positive to dust mites, cat, dog, grass. Borderline positive to mold, cockroach and tree pollen. Interim history - controlled with zyrtec. Continue zyrtec 10mg  daily. May use Flonase 1-2 sprays in each nostril once a day as needed for nasal symptoms.  Continue environmental control measures.   Anaphylaxis due to hymenoptera venom Past history - Anaphylactic reaction to hymenoptera in the past and apparently was on allergy immunotherapy as a child for this. Most recently got stung a few years ago which required epinephrine use. Bloodwork was borderline positive to paper wasp. Interim history - No stings since last visit.  Avoid insect stings. For mild symptoms you can take over the counter antihistamines such as Benadryl and monitor symptoms closely. If symptoms worsen or if you have severe symptoms including breathing issues, throat closure, significant swelling, whole body hives, severe diarrhea and vomiting, lightheadedness then inject epinephrine and seek immediate medical care afterwards.  Shortness of breath No inhaler use. He thinks it's from his CHF. Monitor symptoms.  May use levoalbuterol rescue inhaler 2 puffs every 4 to  6 hours as needed for shortness of breath, chest tightness, coughing, and wheezing. Monitor frequency of use.  Get spirometry at next  visit.   Return in about 6 months (around 11/13/2021).  No orders of the defined types were placed in this encounter.  Lab Orders  No laboratory test(s) ordered today    Diagnostics: None.  Medication List:  Current Outpatient Medications  Medication Sig Dispense Refill   ACCU-CHEK FASTCLIX LANCETS MISC Use to check blood sugar 3 times daily. 102 each 11   aspirin 81 MG tablet Take 81 mg by mouth daily.     atorvastatin (LIPITOR) 10 MG tablet Take 1 tablet (10 mg total) by mouth daily. 90 tablet 1   carvedilol (COREG) 6.25 MG tablet Take 1 tablet by mouth twice daily 180 tablet 3   cetirizine (ZYRTEC) 10 MG tablet Take 1 tablet by mouth once daily 90 tablet 1   dapagliflozin propanediol (FARXIGA) 5 MG TABS tablet Take 1 tablet (5 mg total) by mouth daily before breakfast. 90 tablet 1   EPINEPHrine 0.3 mg/0.3 mL IJ SOAJ injection Inject 0.3 mg into the muscle as needed. 1 each 2   famotidine (PEPCID) 20 MG tablet Take 1 tablet by mouth once daily 90 tablet 1   fluticasone (FLONASE) 50 MCG/ACT nasal spray Use 1-2 sprays in each nostril once a day as needed for nasal congestion. 16 g 5   furosemide (LASIX) 80 MG tablet Take 1 tablet (80 mg total) by mouth 2 (two) times daily. 180 tablet 3   gabapentin (NEURONTIN) 300 MG capsule TAKE 1 CAPSULE BY MOUTH EVERY DAY AT BEDTIME 90 capsule 1   gabapentin (NEURONTIN) 300 MG capsule Take 1 capsule (300 mg total) by mouth at bedtime. 90 capsule 3   glucose blood (ACCU-CHEK GUIDE) test strip Use as instructed to check blood sugar 3 times daily. 100 each 11   Lancet Devices (ACCU-CHEK SOFTCLIX) lancets 1 each by Other route 3 (three) times daily. 1 each 0   levalbuterol (XOPENEX HFA) 45 MCG/ACT inhaler Inhale 2 puffs into the lungs every 6 (six) hours as needed for wheezing or shortness of breath. 15 g 1   methocarbamol (ROBAXIN) 500 MG tablet Take 1 tablet (500 mg total) by mouth daily as needed for muscle spasms. 90 tablet 1   mometasone (ELOCON)  0.1 % cream Apply 1 application topically daily. 45 g 1   potassium chloride (KLOR-CON) 10 MEQ tablet Take 10 mEq by mouth daily.     sacubitril-valsartan (ENTRESTO) 24-26 MG Take 1 tablet by mouth 2 (two) times daily. 180 tablet 3   sildenafil (VIAGRA) 100 MG tablet Take 1-2 tabs PO 1/2-1 hr prior to intercourse. 20 tablet 4   sitaGLIPtin (JANUVIA) 25 MG tablet Take 1 tablet (25 mg total) by mouth daily. 90 tablet 4   tamsulosin (FLOMAX) 0.4 MG CAPS capsule Take 2 capsules (0.8 mg total) by mouth daily. 180 capsule 1   triamcinolone cream (KENALOG) 0.1 % Apply 1 application topically 2 (two) times daily as needed. Do not use on the face, neck, armpits or groin area. Do not use more than 3 weeks in a row. 30 g 0   amoxicillin (AMOXIL) 500 MG capsule Take 1 capsule (500 mg total) by mouth 3 (three) times daily. (Patient not taking: Reported on 04/17/2021) 21 capsule 0   No current facility-administered medications for this visit.   Allergies: Allergies  Allergen Reactions   Bee Venom Anaphylaxis   I reviewed his past medical history,  social history, family history, and environmental history and no significant changes have been reported from his previous visit.  Review of Systems  Constitutional:  Negative for appetite change, chills, fever and unexpected weight change.  HENT:  Negative for congestion and rhinorrhea.   Eyes:  Negative for itching.  Respiratory:  Positive for shortness of breath. Negative for cough, chest tightness and wheezing.   Cardiovascular:  Negative for chest pain.  Gastrointestinal:  Negative for abdominal pain.  Genitourinary:  Negative for difficulty urinating.  Skin:  Negative for rash.  Allergic/Immunologic: Positive for environmental allergies. Negative for food allergies.  Neurological:  Negative for headaches.   Objective: Physical Exam Not obtained as encounter was done via telephone.   Previous notes and tests were reviewed.  I discussed the  assessment and treatment plan with the patient. The patient was provided an opportunity to ask questions and all were answered. The patient agreed with the plan and demonstrated an understanding of the instructions. After visit summary/patient instructions available via mychart.   The patient was advised to call back or seek an in-person evaluation if the symptoms worsen or if the condition fails to improve as anticipated.  I provided 11 minutes of non-face-to-face time during this encounter.  It was my pleasure to participate in [place] care today. Please feel free to contact me with any questions or concerns.   Sincerely,  Rexene Alberts, DO Allergy & Immunology  [facility] office: [place] office: [phone]

## 2021-05-17 ENCOUNTER — Other Ambulatory Visit: Payer: Self-pay

## 2021-05-17 NOTE — Patient Instructions (Signed)
Visit Information  [name]  - as a part of your Medicaid benefit, you are eligible for care management and care coordination services at no cost or copay. I was unable to reach you by phone today but would be happy to help you with your health related needs. Please feel free to call me @ [phone].   A member of the Managed Medicaid care management team will reach out to you again over the next 14 days.   Lurena Joiner RN, BSN [place]  [employer] manager

## 2021-05-17 NOTE — Patient Outreach (Signed)
Care Coordination  05/17/2021  [name] [birth year] [id number]   Medicaid Managed Care   Unsuccessful Outreach Note  05/17/2021 Name: [name] MRN: [id number] DOB: 1966/04/27  Referred by: [name], MD Reason for referral : High Risk Managed Medicaid (Unsuccessful RNCM follow up telephone outreach)   An unsuccessful telephone outreach was attempted today. The patient was referred to the case management team for assistance with care management and care coordination.   Follow Up Plan: A HIPAA compliant phone message was left for the patient providing contact information and requesting a return call.   Lurena Joiner RN, BSN [place]  [employer] manager

## 2021-05-24 ENCOUNTER — Ambulatory Visit (INDEPENDENT_AMBULATORY_CARE_PROVIDER_SITE_OTHER): Payer: Medicaid Other

## 2021-05-24 DIAGNOSIS — I5022 Chronic systolic (congestive) heart failure: Secondary | ICD-10-CM

## 2021-05-27 ENCOUNTER — Ambulatory Visit (INDEPENDENT_AMBULATORY_CARE_PROVIDER_SITE_OTHER): Payer: Medicaid Other

## 2021-05-27 DIAGNOSIS — Z95810 Presence of automatic (implantable) cardiac defibrillator: Secondary | ICD-10-CM

## 2021-05-27 DIAGNOSIS — I5022 Chronic systolic (congestive) heart failure: Secondary | ICD-10-CM | POA: Diagnosis not present

## 2021-05-27 LAB — CUP PACEART REMOTE DEVICE CHECK
Battery Remaining Longevity: 43 mo
Battery Remaining Percentage: 41 %
Battery Voltage: 2.9 V
Brady Statistic RV Percent Paced: 1 %
Date Time Interrogation Session: 20221216125512
HighPow Impedance: 69 Ohm
HighPow Impedance: 69 Ohm
Implantable Lead Implant Date: 20150717
Implantable Lead Location: 753860
Implantable Pulse Generator Implant Date: 20150717
Lead Channel Impedance Value: 310 Ohm
Lead Channel Pacing Threshold Amplitude: 1 V
Lead Channel Pacing Threshold Pulse Width: 0.5 ms
Lead Channel Sensing Intrinsic Amplitude: 11.8 mV
Lead Channel Setting Pacing Amplitude: 2.5 V
Lead Channel Setting Pacing Pulse Width: 0.5 ms
Lead Channel Setting Sensing Sensitivity: 0.5 mV
Pulse Gen Serial Number: 7196009

## 2021-05-28 ENCOUNTER — Other Ambulatory Visit: Payer: Self-pay | Admitting: *Deleted

## 2021-05-28 NOTE — Patient Outreach (Signed)
Care Coordination  05/28/2021  [name] 08-03-1965 [id number]   Medicaid Managed Care   Unsuccessful Outreach Note  05/28/2021 Name: [name] MRN: [id number] DOB: [birth year]  Referred by: [name], MD Reason for referral : High Risk Managed Medicaid (Unsuccessful RNCM follow up outreach x 2)   A second unsuccessful telephone outreach was attempted today. The patient was referred to the case management team for assistance with care management and care coordination.   Follow Up Plan: A HIPAA compliant phone message was left for the patient providing contact information and requesting a return call.    Lurena Joiner RN, BSN [place] RN Care Coordinator

## 2021-05-28 NOTE — Patient Instructions (Signed)
Visit Information  [name]  - as a part of your Medicaid benefit, you are eligible for care management and care coordination services at no cost or copay. I was unable to reach you by phone today but would be happy to help you with your health related needs. Please feel free to call me @ [phone].   A member of the Managed Medicaid care management team will reach out to you again over the next 14 days.   Lurena Joiner RN, BSN [place] RN Care Coordinator

## 2021-05-29 ENCOUNTER — Telehealth: Payer: Self-pay | Admitting: Internal Medicine

## 2021-05-29 NOTE — Progress Notes (Signed)
EPIC Encounter for ICM Monitoring  Patient Name: [name] is a 55 y.o. male Date: 05/29/2021 Primary Care Physican: [name], MD Primary Cardiologist: Stanford Breed Electrophysiologist: Allred 03/20/2021 Office Weight 258 lbs (not weighing at home)                            Spoke with patient and heart failure questions reviewed.  Pt reports SOB of that comes and goes which he will discuss with PCP in January.   Corvue thoracic impedance normal fluid levels.   Prescribed dosage:  Furosemide 80 mg take 1 tablet (80 mg total) twice a day.    Potassium 10 mEq take 1 tablet daily.    Labs: 01/14/2021 Creatinine 1.82, BUN 11, Potassium 3.8, Sodium 143, GFR 43 07/17/2020 Creatinine 1.66, BUN 13, Potassium 3.7, Sodium 142, GFR 46-53 Complete set of lab results in result review.    Recommendations:  No changes and encouraged to call if experiencing any fluid symptoms.   Follow-up plan: ICM clinic phone appointment on 07/01/2021.   91 day device clinic remote transmission 08/13/2021.     EP/Cardiology Office Visits: Recall 09/16/2021 with [name].  Recall 09/15/2021 with Oda Kilts, PA   Copy of ICM check sent to Dr. Rayann Heman.    3 month ICM trend: 05/27/2021.    12-14 Month ICM trend:       [name], RN 05/29/2021 3:34 PM

## 2021-05-29 NOTE — Telephone Encounter (Signed)
.. °  Medicaid Managed Care   Unsuccessful Outreach Note  05/29/2021 Name: [name] MRN: [id number] DOB: 1965-08-17  Referred by: [name], MD Reason for referral : High Risk Managed Medicaid (I called the patient today to get his phone visit with the MM RNCM rescheduled. I left my name and number on his VM.)   An unsuccessful telephone outreach was attempted today. The patient was referred to the case management team for assistance with care management and care coordination.   Follow Up Plan: The care management team will reach out to the patient again over the next 7 days.   [place]

## 2021-06-05 NOTE — Progress Notes (Signed)
Remote ICD transmission.   

## 2021-06-06 ENCOUNTER — Other Ambulatory Visit: Payer: Self-pay | Admitting: Internal Medicine

## 2021-06-06 ENCOUNTER — Other Ambulatory Visit: Payer: Self-pay

## 2021-06-06 ENCOUNTER — Other Ambulatory Visit: Payer: Medicaid Other | Admitting: *Deleted

## 2021-06-06 NOTE — Patient Outreach (Signed)
Care Coordination  06/06/2021  [name] [birth year] [id number]   Medicaid Managed Care   Unsuccessful Outreach Note  06/06/2021 Name: [name] MRN: [id number] DOB: 1965/09/22  Referred by: [name], MD Reason for referral : Case Closure (RNCM performing case closure for 3 unsuccessful outreach attempts)   Three or more unsuccessful telephone outreach attempts have been made. The patient was referred to the case management team for assistance with care management and care coordination. The patient's primary care provider has been notified of our unsuccessful attempts to make or maintain contact with the patient. The care management team is pleased to engage with this patient at any time in the future should he/she be interested in assistance from the care management team.   Follow Up Plan: We have been unable to make contact with the patient for follow up. The care management team is available to follow up with the patient after provider conversation with the patient regarding recommendation for care management engagement and subsequent re-referral to the care management team.   Lurena Joiner RN, BSN [place] RN Care Coordinator

## 2021-06-07 NOTE — Telephone Encounter (Signed)
Requested medication (s) are due for refill today: no, requesting early  Requested medication (s) are on the active medication list: yes  Last refill:  05/03/21  Future visit scheduled: 06/20/21  Notes to clinic:  This medication can not be delegated, please assess.   Requested Prescriptions  Pending Prescriptions Disp Refills   methocarbamol (ROBAXIN) 500 MG tablet [Pharmacy Med Name: Methocarbamol 500 MG Oral Tablet] 90 tablet 0    Sig: TAKE 1 TABLET BY MOUTH ONCE DAILY AS NEEDED FOR MUSCLE SPASM     Not Delegated - Analgesics:  Muscle Relaxants Failed - 06/06/2021  8:51 PM      Failed - This refill cannot be delegated      Passed - Valid encounter within last 6 months    Recent Outpatient Visits           2 months ago Sore throat   [place] Karle Plumber B, MD   2 months ago Type 2 diabetes mellitus with obesity [facility])   [place] Karle Plumber B, MD   10 months ago Type 2 diabetes mellitus with obesity [facility])   [place], Deborah B, MD   1 year ago Type 2 diabetes mellitus with obesity [facility])   [place], Deborah B, MD   1 year ago Type 2 diabetes mellitus with diabetic polyneuropathy, without long-term current use of insulin [facility])   [place], MD       Future Appointments             In 1 week [name], MD [place]   In 5 months Garnet Sierras, DO [facility] [place]

## 2021-06-20 ENCOUNTER — Ambulatory Visit: Payer: Medicaid Other | Attending: Internal Medicine | Admitting: Internal Medicine

## 2021-06-20 DIAGNOSIS — I5022 Chronic systolic (congestive) heart failure: Secondary | ICD-10-CM | POA: Diagnosis not present

## 2021-06-20 DIAGNOSIS — E669 Obesity, unspecified: Secondary | ICD-10-CM

## 2021-06-20 DIAGNOSIS — E1169 Type 2 diabetes mellitus with other specified complication: Secondary | ICD-10-CM | POA: Diagnosis not present

## 2021-06-20 DIAGNOSIS — N1831 Chronic kidney disease, stage 3a: Secondary | ICD-10-CM

## 2021-06-20 NOTE — Progress Notes (Signed)
Patient ID: [name], male   DOB: Sep 30, 1965, 56 y.o.   MRN: [id number] Virtual Visit via Telephone Note  I connected with [name] on 06/20/2021 at 4:25 p.m by telephone and verified that I am speaking with the correct person using two identifiers  Location: Patient: home Provider: office  Participants: Myself Patient    I discussed the limitations, risks, security and privacy concerns of performing an evaluation and management service by telephone and the availability of in person appointments. I also discussed with the patient that there may be a patient responsible charge related to this service. The patient expressed understanding and agreed to proceed.   History of Present Illness: Patient with history of NICM, systolic CHF s/p ICD placement, DM type 2, HL, CKD stage 3,  obesity, BPH, GERD and chronic LBP secondary to spondylosis and spinal stenosis, COVID-19 infection. Last eval 03/2021.  Today's visit is for chronic disease management.  DM: Last A1c was 8.9.  He has not checked his blood sugars in the past 1-1/2 weeks.  Prior to that he states his average was around 150.  Taking [place] as prescribed.  Just started back walking for exercise 2 to 3 weeks ago because he was dealing with a bone spur.  He is eating less.  He thinks his weight is about the same as it was in October in the 250s.  CKD: Missed his appointment with nephrology but plans to reschedule.  Last year are in the system was 68 with creatinine of 1.82.  NICM/systolic CHF: Saw [name] in October after he last saw me.  He reports compliance with his medications including furosemide, carvedilol and [place].  No lower extremity edema.  He sleeps on 4 pillows and this is not new for him.  Intermittent shortness of breath at times.  HM: Patient declines Tdap, Shingrix, PCV 20.  Outpatient Encounter Medications as of 06/20/2021  Medication Sig Note   ACCU-CHEK FASTCLIX LANCETS MISC Use to check  blood sugar 3 times daily.    amoxicillin (AMOXIL) 500 MG capsule Take 1 capsule (500 mg total) by mouth 3 (three) times daily. (Patient not taking: Reported on 04/17/2021) 04/17/2021: completed   aspirin 81 MG tablet Take 81 mg by mouth daily.    atorvastatin (LIPITOR) 10 MG tablet Take 1 tablet (10 mg total) by mouth daily.    carvedilol (COREG) 6.25 MG tablet Take 1 tablet by mouth twice daily    cetirizine (ZYRTEC) 10 MG tablet Take 1 tablet by mouth once daily    dapagliflozin propanediol (FARXIGA) 5 MG TABS tablet Take 1 tablet (5 mg total) by mouth daily before breakfast.    EPINEPHrine 0.3 mg/0.3 mL IJ SOAJ injection Inject 0.3 mg into the muscle as needed.    famotidine (PEPCID) 20 MG tablet Take 1 tablet by mouth once daily    fluticasone (FLONASE) 50 MCG/ACT nasal spray Use 1-2 sprays in each nostril once a day as needed for nasal congestion.    furosemide (LASIX) 80 MG tablet Take 1 tablet (80 mg total) by mouth 2 (two) times daily.    gabapentin (NEURONTIN) 300 MG capsule TAKE 1 CAPSULE BY MOUTH EVERY DAY AT BEDTIME    gabapentin (NEURONTIN) 300 MG capsule Take 1 capsule (300 mg total) by mouth at bedtime.    glucose blood (ACCU-CHEK GUIDE) test strip Use as instructed to check blood sugar 3 times daily.    Lancet Devices (ACCU-CHEK SOFTCLIX) lancets 1 each by Other route 3 (  three) times daily.    levalbuterol (XOPENEX HFA) 45 MCG/ACT inhaler Inhale 2 puffs into the lungs every 6 (six) hours as needed for wheezing or shortness of breath.    methocarbamol (ROBAXIN) 500 MG tablet TAKE 1 TABLET BY MOUTH ONCE DAILY AS NEEDED FOR MUSCLE SPASM    mometasone (ELOCON) 0.1 % cream Apply 1 application topically daily.    potassium chloride (KLOR-CON) 10 MEQ tablet Take 10 mEq by mouth daily.    sacubitril-valsartan (ENTRESTO) 24-26 MG Take 1 tablet by mouth 2 (two) times daily.    sildenafil (VIAGRA) 100 MG tablet Take 1-2 tabs PO 1/2-1 hr prior to intercourse.    sitaGLIPtin (JANUVIA) 25 MG  tablet Take 1 tablet (25 mg total) by mouth daily.    tamsulosin (FLOMAX) 0.4 MG CAPS capsule Take 2 capsules (0.8 mg total) by mouth daily.    triamcinolone cream (KENALOG) 0.1 % Apply 1 application topically 2 (two) times daily as needed. Do not use on the face, neck, armpits or groin area. Do not use more than 3 weeks in a row.    [DISCONTINUED] furosemide (LASIX) 80 MG tablet Take 1 tablet (80 mg total) by mouth 2 (two) times daily.    [DISCONTINUED] methocarbamol (ROBAXIN) 500 MG tablet Take 1 tablet (500 mg total) by mouth daily as needed for muscle spasms.    [DISCONTINUED] sacubitril-valsartan (ENTRESTO) 24-26 MG Take 1 tablet by mouth 2 (two) times daily.    No facility-administered encounter medications on file as of 06/20/2021.      Observations/Objective: No direct observation done as this was a telephone encounter.  Assessment and Plan: 1. Type 2 diabetes mellitus with obesity ([place]) Patient to check blood sugars at least once a day. Encourage healthy eating habits. Continue regular exercise. Continue Januvia and [place].  He will come to the lab to have blood test done including A1c. - Comprehensive metabolic panel; Future - Lipid panel; Future - Hemoglobin A1c; Future - Microalbumin / creatinine urine ratio; Future - CBC; Future  2. Stage 3a chronic kidney disease [facility]) Patient will call his nephrologist office to reschedule his appointment.  We will check chemistry and microalbumin when he comes into the lab.  3. Chronic systolic congestive heart failure ([place]) Followed by cardiology.  Stable on current medications.   Follow Up Instructions: 3 mths   I discussed the assessment and treatment plan with the patient. The patient was provided an opportunity to ask questions and all were answered. The patient agreed with the plan and demonstrated an understanding of the instructions.   The patient was advised to call back or seek an in-person evaluation if the symptoms  worsen or if the condition fails to improve as anticipated.  I  Spent 9 minutes on this telephone encounter  Karle Plumber, MD

## 2021-06-21 ENCOUNTER — Telehealth: Payer: Self-pay

## 2021-06-21 NOTE — Telephone Encounter (Signed)
-----   Message from [name], MD sent at 06/20/2021  4:49 PM EST ----- Follow-up in 3 to 4 months.

## 2021-06-21 NOTE — Telephone Encounter (Signed)
Pt has been scheduled and reminder has been mailed.  

## 2021-07-01 ENCOUNTER — Ambulatory Visit (INDEPENDENT_AMBULATORY_CARE_PROVIDER_SITE_OTHER): Payer: Medicaid Other

## 2021-07-01 DIAGNOSIS — Z95810 Presence of automatic (implantable) cardiac defibrillator: Secondary | ICD-10-CM

## 2021-07-01 DIAGNOSIS — I5022 Chronic systolic (congestive) heart failure: Secondary | ICD-10-CM | POA: Diagnosis not present

## 2021-07-03 NOTE — Progress Notes (Signed)
EPIC Encounter for ICM Monitoring  Patient Name: [name] is a 56 y.o. male Date: 07/03/2021 Primary Care Physican: [name], MD Primary Cardiologist: Stanford Breed Electrophysiologist: Allred 07/03/2021 Weight 258 lbs                            Spoke with patient and heart failure questions reviewed.  Pt reports a little SOB today but did not last very long and feels fine now.    Corvue thoracic impedance normal fluid levels.   Prescribed dosage:  Furosemide 80 mg take 1 tablet (80 mg total) twice a day.    Potassium 10 mEq take 1 tablet daily.    Labs: 01/14/2021 Creatinine 1.82, BUN 11, Potassium 3.8, Sodium 143, GFR 43 07/17/2020 Creatinine 1.66, BUN 13, Potassium 3.7, Sodium 142, GFR 46-53 Complete set of lab results in result review.    Recommendations:  No changes and encouraged to call if experiencing any fluid symptoms.   Follow-up plan: ICM clinic phone appointment on 08/05/2021.   91 day device clinic remote transmission 08/13/2021.     EP/Cardiology Office Visits: Recall 09/16/2021 with [name].  Recall 09/15/2021 with Oda Kilts, PA   Copy of ICM check sent to Dr. Rayann Heman.    3 month ICM trend: 07/01/2021.    12-14 Month ICM trend:     [name], RN 07/03/2021 9:22 AM

## 2021-07-08 ENCOUNTER — Encounter: Payer: Self-pay | Admitting: Internal Medicine

## 2021-07-09 ENCOUNTER — Other Ambulatory Visit: Payer: Self-pay | Admitting: Internal Medicine

## 2021-07-09 ENCOUNTER — Encounter: Payer: Self-pay | Admitting: Internal Medicine

## 2021-07-09 DIAGNOSIS — N138 Other obstructive and reflux uropathy: Secondary | ICD-10-CM

## 2021-07-09 DIAGNOSIS — N401 Enlarged prostate with lower urinary tract symptoms: Secondary | ICD-10-CM

## 2021-07-10 MED ORDER — METHOCARBAMOL 500 MG PO TABS: ORAL_TABLET | ORAL | 3 refills | Status: DC

## 2021-07-10 MED ORDER — TAMSULOSIN HCL 0.4 MG PO CAPS: 0.8000 mg | ORAL_CAPSULE | Freq: Every day | ORAL | 1 refills | Status: DC

## 2021-07-10 MED ORDER — BENZONATATE 100 MG PO CAPS: 100.0000 mg | ORAL_CAPSULE | Freq: Two times a day (BID) | ORAL | 0 refills | Status: DC | PRN

## 2021-07-10 NOTE — Telephone Encounter (Signed)
Requested medication (s) are due for refill today: early request for Robaxin  Requested medication (s) are on the active medication list: yes  Last refill:  Flomax 01/16/21 #180 with 1 RF, Robaxin 06/07/21 #30 No RF  Future visit scheduled: 10/21/21  Notes to clinic:  Robaxin is not delegated, Flomax labs are from 2018, please assess.  Requested Prescriptions  Pending Prescriptions Disp Refills   tamsulosin (FLOMAX) 0.4 MG CAPS capsule [Pharmacy Med Name: Tamsulosin HCl 0.4 MG Oral Capsule] 180 capsule 0    Sig: Take 2 capsules by mouth once daily     Urology: Alpha-Adrenergic Blocker Failed - 07/09/2021  7:02 PM      Failed - PSA in normal range and within 360 days    Prostate Specific Ag, Serum  Date Value Ref Range Status  04/17/2017 0.7 0.0 - 4.0 ng/mL Final    Comment:    Roche ECLIA methodology. According to the American Urological Association, Serum PSA should decrease and remain at undetectable levels after radical prostatectomy. The AUA defines biochemical recurrence as an initial PSA value 0.2 ng/mL or greater followed by a subsequent confirmatory PSA value 0.2 ng/mL or greater. Values obtained with different assay methods or kits cannot be used interchangeably. Results cannot be interpreted as absolute evidence of the presence or absence of malignant disease.           Passed - Last BP in normal range    BP Readings from Last 1 Encounters:  03/20/21 107/72          Passed - Valid encounter within last 12 months    Recent Outpatient Visits           2 weeks ago Type 2 diabetes mellitus with obesity ([place])   [place] [name], MD   3 months ago Sore throat   [place], Deborah B, MD   3 months ago Type 2 diabetes mellitus with obesity [facility])   [place] Karle Plumber B, MD   11 months ago Type 2 diabetes mellitus with obesity [facility])   [place], Deborah B, MD   1 year ago Type 2 diabetes mellitus with obesity [facility])   Anton Ruiz, MD       Future Appointments             In 3 months Wynetta Emery Dalbert Batman, MD [place]   In 4 months Garnet Sierras, DO [facility] and [place]             methocarbamol ([place]) 500 MG tablet [Pharmacy Med Name: Methocarbamol 500 MG Oral Tablet] 90 tablet 0    Sig: TAKE 1 TABLET BY MOUTH ONCE DAILY AS NEEDED FOR MUSCLE SPASM     Not Delegated - Analgesics:  Muscle Relaxants Failed - 07/09/2021  7:02 PM      Failed - This refill cannot be delegated      Passed - Valid encounter within last 6 months    Recent Outpatient Visits           2 weeks ago Type 2 diabetes mellitus with obesity [facility])   [place] [name], MD   3 months ago Sore throat   [place] [name], MD   3 months ago Type 2  diabetes mellitus with obesity [facility])   [place] Karle Plumber B, MD   11 months ago Type 2 diabetes mellitus with obesity [facility])   [place], MD   1 year ago Type 2 diabetes mellitus with obesity [facility]   [place], MD       Future Appointments             In 3 months Wynetta Emery Dalbert Batman, MD [place]   In 4 months Garnet Sierras, DO All[facility] and [place]

## 2021-07-19 ENCOUNTER — Encounter: Payer: Self-pay | Admitting: Internal Medicine

## 2021-07-19 ENCOUNTER — Other Ambulatory Visit: Payer: Self-pay | Admitting: Pharmacist

## 2021-07-19 DIAGNOSIS — E785 Hyperlipidemia, unspecified: Secondary | ICD-10-CM

## 2021-07-19 DIAGNOSIS — E1169 Type 2 diabetes mellitus with other specified complication: Secondary | ICD-10-CM

## 2021-07-19 MED ORDER — ATORVASTATIN CALCIUM 10 MG PO TABS: 10.0000 mg | ORAL_TABLET | Freq: Every day | ORAL | 1 refills | Status: DC

## 2021-07-22 ENCOUNTER — Other Ambulatory Visit: Payer: Self-pay | Admitting: Internal Medicine

## 2021-07-22 MED ORDER — DAPAGLIFLOZIN PROPANEDIOL 5 MG PO TABS: 5.0000 mg | ORAL_TABLET | Freq: Every day | ORAL | 0 refills | Status: DC

## 2021-07-22 NOTE — Telephone Encounter (Signed)
Will forward to provider  

## 2021-08-05 ENCOUNTER — Ambulatory Visit (INDEPENDENT_AMBULATORY_CARE_PROVIDER_SITE_OTHER): Payer: Medicaid Other

## 2021-08-05 DIAGNOSIS — Z95810 Presence of automatic (implantable) cardiac defibrillator: Secondary | ICD-10-CM | POA: Diagnosis not present

## 2021-08-05 DIAGNOSIS — I5022 Chronic systolic (congestive) heart failure: Secondary | ICD-10-CM

## 2021-08-09 NOTE — Progress Notes (Signed)
EPIC Encounter for ICM Monitoring  Patient Name: [name] is a 56 y.o. male Date: 08/09/2021 Primary Care Physican: [name], MD Primary Cardiologist: Stanford Breed Electrophysiologist: Allred 07/03/2021 Weight 258 lbs                            Spoke with patient and heart failure questions reviewed.  Pt reports feeling lightheaded during possible dry episodes.     Corvue thoracic impedance suggesting several episodes of possible dryness in the last month.   Prescribed dosage:  Furosemide 80 mg take 1 tablet (80 mg total) twice a day.    Potassium 10 mEq take 1 tablet daily.    Labs: 01/14/2021 Creatinine 1.82, BUN 11, Potassium 3.8, Sodium 143, GFR 43 07/17/2020 Creatinine 1.66, BUN 13, Potassium 3.7, Sodium 142, GFR 46-53 Complete set of lab results in result review.    Recommendations:  Advised to measure fluid intake and drink minimum of 64 oz daily to stay hydrated.     Follow-up plan: ICM clinic phone appointment on 08/21/2021.   91 day device clinic remote transmission 08/23/2021.     EP/Cardiology Office Visits: Recall 09/16/2021 with [name].  Recall 09/15/2021 with Oda Kilts, PA   Copy of ICM check sent to Dr. Rayann Heman.   3 month ICM trend: 08/05/2021.    12-14 Month ICM trend:     [name], RN 08/09/2021 1:43 PM

## 2021-08-21 ENCOUNTER — Ambulatory Visit (INDEPENDENT_AMBULATORY_CARE_PROVIDER_SITE_OTHER): Payer: Medicaid Other

## 2021-08-21 DIAGNOSIS — I5022 Chronic systolic (congestive) heart failure: Secondary | ICD-10-CM

## 2021-08-21 DIAGNOSIS — Z95810 Presence of automatic (implantable) cardiac defibrillator: Secondary | ICD-10-CM

## 2021-08-21 NOTE — Progress Notes (Signed)
EPIC Encounter for ICM Monitoring ? ?Patient Name: [name] is a 56 y.o. male ?Date: 08/21/2021 ?Primary Care Physican: [name], MD ?Primary Cardiologist: Stanford Breed ?Electrophysiologist: Allred ?07/03/2021 Weight 258 lbs ?                         ?  ?Spoke with patient and heart failure questions reviewed.  Pt reports lightheadedness has resolved but he does have chronic SOB.   ?  ?Corvue thoracic impedance suggesting fluid levels have returned to normal. ?  ?Prescribed dosage:  ?Furosemide 80 mg take 1 tablet (80 mg total) twice a day.    ?Potassium 10 mEq take 1 tablet daily.  ?  ?Labs: ?01/14/2021 Creatinine 1.82, BUN 11, Potassium 3.8, Sodium 143, GFR 43 ?07/17/2020 Creatinine 1.66, BUN 13, Potassium 3.7, Sodium 142, GFR 46-53 ?Complete set of lab results in result review.  ?  ?Recommendations:  No changes and encouraged to call if experiencing any fluid symptoms. ?  ?Follow-up plan: ICM clinic phone appointment on 09/09/2021.   91 day device clinic remote transmission 08/23/2021.   ?  ?EP/Cardiology Office Visits: Recall 09/16/2021 with [name].  Recall 09/15/2021 with Oda Kilts, PA ?  ?Copy of ICM check sent to Dr. Rayann Heman.  ? ?3 month ICM trend: 08/21/2021. ? ? ? ?12-14 Month ICM trend:  ? ? ? ?[name], RN ?08/21/2021 ?12:19 PM ? ?

## 2021-08-22 ENCOUNTER — Encounter: Payer: Self-pay | Admitting: Internal Medicine

## 2021-08-22 ENCOUNTER — Other Ambulatory Visit: Payer: Self-pay | Admitting: Internal Medicine

## 2021-08-22 NOTE — Telephone Encounter (Signed)
Requested Prescriptions  ?Pending Prescriptions Disp Refills  ?? JANUVIA 25 MG tablet [Pharmacy Med Name: Januvia 25 MG Oral Tablet] 90 tablet 0  ?  Sig: Take 1 tablet by mouth once daily  ?  ? Endocrinology:  Diabetes - DPP-4 Inhibitors Failed - 08/22/2021 11:39 AM  ?  ?  Failed - HBA1C is between 0 and 7.9 and within 180 days  ?  HbA1c, POC (controlled diabetic range)  ?Date Value Ref Range Status  ?03/19/2021 8.9 (A) 0.0 - 7.0 % Final  ?   ?  ?  Failed - Cr in normal range and within 360 days  ?  Creat  ?Date Value Ref Range Status  ?04/24/2016 1.68 (H) 0.70 - 1.33 mg/dL Final  ?  Comment:  ?    ?For patients > or = 57 years of age: The upper reference limit for ?Creatinine is approximately 13% higher for people identified as ?African-American. ?  ?  ? ?Creatinine, Ser  ?Date Value Ref Range Status  ?01/14/2021 1.82 (H) 0.76 - 1.27 mg/dL Final  ?   ?  ?  Passed - Valid encounter within last 6 months  ?  Recent Outpatient Visits   ?      ? 2 months ago Type 2 diabetes mellitus with obesity ([place])  ? [place] Karle Plumber B, MD  ? 4 months ago Sore throat  ? [place] Karle Plumber B, MD  ? 5 months ago Type 2 diabetes mellitus with obesity ([place])  ? [place] Karle Plumber B, MD  ? 1 year ago Type 2 diabetes mellitus with obesity ([place])  ? [place] Karle Plumber B, MD  ? 1 year ago Type 2 diabetes mellitus with obesity ([place])  ? [place] [name], MD  ?  ?  ?Future Appointments   ?        ? In 2 months [name], MD [place]  ? In 3 months Garnet Sierras, DO All[facility] and [place]  ?  ? ?  ?  ?  ? ? ? ?

## 2021-08-26 ENCOUNTER — Ambulatory Visit: Payer: Medicaid Other | Attending: Internal Medicine

## 2021-08-26 ENCOUNTER — Other Ambulatory Visit: Payer: Self-pay

## 2021-08-26 DIAGNOSIS — E669 Obesity, unspecified: Secondary | ICD-10-CM | POA: Diagnosis not present

## 2021-08-26 DIAGNOSIS — E1169 Type 2 diabetes mellitus with other specified complication: Secondary | ICD-10-CM | POA: Diagnosis not present

## 2021-08-27 DIAGNOSIS — E876 Hypokalemia: Secondary | ICD-10-CM | POA: Diagnosis not present

## 2021-08-27 DIAGNOSIS — I428 Other cardiomyopathies: Secondary | ICD-10-CM | POA: Diagnosis not present

## 2021-08-27 DIAGNOSIS — D649 Anemia, unspecified: Secondary | ICD-10-CM | POA: Diagnosis not present

## 2021-08-27 DIAGNOSIS — E1122 Type 2 diabetes mellitus with diabetic chronic kidney disease: Secondary | ICD-10-CM | POA: Diagnosis not present

## 2021-08-27 DIAGNOSIS — N2581 Secondary hyperparathyroidism of renal origin: Secondary | ICD-10-CM | POA: Diagnosis not present

## 2021-08-27 DIAGNOSIS — N1832 Chronic kidney disease, stage 3b: Secondary | ICD-10-CM | POA: Diagnosis not present

## 2021-08-27 DIAGNOSIS — N400 Enlarged prostate without lower urinary tract symptoms: Secondary | ICD-10-CM | POA: Diagnosis not present

## 2021-08-27 DIAGNOSIS — E278 Other specified disorders of adrenal gland: Secondary | ICD-10-CM | POA: Diagnosis not present

## 2021-08-27 LAB — COMPREHENSIVE METABOLIC PANEL
ALT: 15 IU/L (ref 0–44)
AST: 12 IU/L (ref 0–40)
Albumin/Globulin Ratio: 1.3 (ref 1.2–2.2)
Albumin: 4.5 g/dL (ref 3.8–4.9)
Alkaline Phosphatase: 80 IU/L (ref 44–121)
BUN/Creatinine Ratio: 10 (ref 9–20)
BUN: 20 mg/dL (ref 6–24)
Bilirubin Total: 0.6 mg/dL (ref 0.0–1.2)
CO2: 23 mmol/L (ref 20–29)
Calcium: 9.4 mg/dL (ref 8.7–10.2)
Chloride: 96 mmol/L (ref 96–106)
Creatinine, Ser: 2.08 mg/dL — ABNORMAL HIGH (ref 0.76–1.27)
Globulin, Total: 3.5 g/dL (ref 1.5–4.5)
Glucose: 348 mg/dL — ABNORMAL HIGH (ref 70–99)
Potassium: 3.9 mmol/L (ref 3.5–5.2)
Sodium: 137 mmol/L (ref 134–144)
Total Protein: 8 g/dL (ref 6.0–8.5)
eGFR: 37 mL/min/{1.73_m2} — ABNORMAL LOW (ref >59)

## 2021-08-27 LAB — CBC
Hematocrit: 40.9 % (ref 37.5–51.0)
Hemoglobin: 14 g/dL (ref 13.0–17.7)
MCH: 30.2 pg (ref 26.6–33.0)
MCHC: 34.2 g/dL (ref 31.5–35.7)
MCV: 88 fL (ref 79–97)
Platelets: 286 10*3/uL (ref 150–450)
RBC: 4.64 x10E6/uL (ref 4.14–5.80)
RDW: 13 % (ref 11.6–15.4)
WBC: 6.3 10*3/uL (ref 3.4–10.8)

## 2021-08-27 LAB — MICROALBUMIN / CREATININE URINE RATIO
Creatinine, Urine: 61.7 mg/dL
Microalb/Creat Ratio: <5 mg/g creat (ref 0–29)
Microalbumin, Urine: <3 ug/mL

## 2021-08-27 LAB — LIPID PANEL
Chol/HDL Ratio: 4.2 ratio (ref 0.0–5.0)
Cholesterol, Total: 178 mg/dL (ref 100–199)
HDL: 42 mg/dL (ref >39)
LDL Chol Calc (NIH): 81 mg/dL (ref 0–99)
Triglycerides: 336 mg/dL — ABNORMAL HIGH (ref 0–149)
VLDL Cholesterol Cal: 55 mg/dL — ABNORMAL HIGH (ref 5–40)

## 2021-08-27 LAB — HEMOGLOBIN A1C
Est. average glucose Bld gHb Est-mCnc: 326 mg/dL
Hgb A1c MFr Bld: 13 % — ABNORMAL HIGH (ref 4.8–5.6)

## 2021-09-04 ENCOUNTER — Encounter: Payer: Self-pay | Admitting: Internal Medicine

## 2021-09-09 ENCOUNTER — Ambulatory Visit (INDEPENDENT_AMBULATORY_CARE_PROVIDER_SITE_OTHER): Payer: Medicaid Other

## 2021-09-09 DIAGNOSIS — Z95810 Presence of automatic (implantable) cardiac defibrillator: Secondary | ICD-10-CM

## 2021-09-09 DIAGNOSIS — I5022 Chronic systolic (congestive) heart failure: Secondary | ICD-10-CM

## 2021-09-11 NOTE — Progress Notes (Signed)
EPIC Encounter for ICM Monitoring ? ?Patient Name: [name] is a 56 y.o. male ?Date: 09/11/2021 ?Primary Care Physican: [name], MD ?Primary Cardiologist: Stanford Breed ?Electrophysiologist: Allred ?07/03/2021 Weight 258 lbs ?09/11/2021 Weight: 262 lbs ?                         ?  ?Spoke with patient and heart failure questions reviewed.  Pt reports weight gain and SOB.  Per pt, nephrologist has ordered a 1 time Metolazone 5 mg dose due to symptoms.    ?  ?Corvue thoracic impedance suggesting possible fluid accumulation starting 4/3 and also decreased from 3/23-4/1. ?  ?Prescribed dosage:  ?Furosemide 80 mg take 1 tablet (80 mg total) twice a day.    ?Potassium 10 mEq take 1 tablet daily.  ?  ?Labs: ?08/26/2021 Creatinine 2.08, BUN 20, Potassium 3.9, Sodium 137, GFR 37, A1C 13% ?01/14/2021 Creatinine 1.82, BUN 11, Potassium 3.8, Sodium 143, GFR 43 ?Complete set of lab results in result review.  ?  ?Recommendations:  He plans on taking 1 time Metolazone dose tomorrow before taking Furosemide as ordered by nephrologist.   ?  ?Follow-up plan: ICM clinic phone appointment on 09/16/2021 (manual) to recheck fluid levels.   91 day device clinic remote transmission 11/22/2021.   ?  ?EP/Cardiology Office Visits: Recall 09/16/2021 with [name].  Recall 09/15/2021 with Oda Kilts, PA ?  ?Copy of ICM check sent to Dr. Rayann Heman and Dr Stanford Breed as Juluis Rainier.   ? ?3 month ICM trend: 09/10/2021. ? ? ? ?12-14 Month ICM trend:  ? ? ? ?[name], RN ?09/11/2021 ?8:39 AM ? ?

## 2021-09-16 ENCOUNTER — Telehealth: Payer: Self-pay

## 2021-09-16 NOTE — Telephone Encounter (Signed)
Spoke with patient and requested to send remote transmission to recheck fluid levels.     ? ?

## 2021-09-18 NOTE — Progress Notes (Unsigned)
No ICM remote transmission received for 09/16/2021 and next ICM transmission scheduled for 09/23/2021.   ?

## 2021-09-19 ENCOUNTER — Other Ambulatory Visit: Payer: Self-pay | Admitting: Cardiology

## 2021-09-19 ENCOUNTER — Encounter: Payer: Self-pay | Admitting: Cardiology

## 2021-09-19 DIAGNOSIS — I10 Essential (primary) hypertension: Secondary | ICD-10-CM

## 2021-09-19 MED ORDER — CARVEDILOL 6.25 MG PO TABS: 6.2500 mg | ORAL_TABLET | Freq: Two times a day (BID) | ORAL | 7 refills | Status: DC

## 2021-09-23 ENCOUNTER — Ambulatory Visit (INDEPENDENT_AMBULATORY_CARE_PROVIDER_SITE_OTHER): Payer: Medicaid Other

## 2021-09-23 DIAGNOSIS — Z95810 Presence of automatic (implantable) cardiac defibrillator: Secondary | ICD-10-CM

## 2021-09-23 DIAGNOSIS — I5022 Chronic systolic (congestive) heart failure: Secondary | ICD-10-CM

## 2021-09-24 NOTE — Progress Notes (Signed)
EPIC Encounter for ICM Monitoring ? ?Patient Name: [name] is a 56 y.o. male ?Date: 09/24/2021 ?Primary Care Physican: [name], MD ?Primary Cardiologist: Stanford Breed ?Electrophysiologist: Allred ?07/03/2021 Weight 258 lbs ?09/11/2021 Weight: 262 lbs ?09/24/2021 Weight: No recent weight ?                         ?  ?Spoke with patient and heart failure questions reviewed.  Pt reports symptoms resolved before needing to take 1 time dose of Metolazone (ordered by Nephrologist)    ?  ?Corvue thoracic impedance suggesting fluid levels returned to normal. ?  ?Prescribed dosage:  ?Furosemide 80 mg take 1 tablet (80 mg total) twice a day.    ?Potassium 10 mEq take 1 tablet daily.  ?  ?Labs: ?08/26/2021 Creatinine 2.08, BUN 20, Potassium 3.9, Sodium 137, GFR 37, A1C 13% ?01/14/2021 Creatinine 1.82, BUN 11, Potassium 3.8, Sodium 143, GFR 43 ?Complete set of lab results in result review.  ?  ?Recommendations:  No changes and encouraged to call if experiencing any fluid symptoms. ?  ?Follow-up plan: ICM clinic phone appointment on 10/21/2021.   91 day device clinic remote transmission 11/22/2021.   ?  ?EP/Cardiology Office Visits: 01/09/2022 with [name].  Recall 09/15/2021 with Oda Kilts, PA ?  ?Copy of ICM check sent to Dr. Rayann Heman. ? ?3 month ICM trend: 09/23/2021. ? ? ? ?12-14 Month ICM trend:  ? ? ? ?[name], RN ?09/24/2021 ?2:52 PM ? ?

## 2021-10-04 ENCOUNTER — Ambulatory Visit (INDEPENDENT_AMBULATORY_CARE_PROVIDER_SITE_OTHER): Payer: Medicaid Other

## 2021-10-04 DIAGNOSIS — I5022 Chronic systolic (congestive) heart failure: Secondary | ICD-10-CM

## 2021-10-04 LAB — CUP PACEART REMOTE DEVICE CHECK
Battery Remaining Longevity: 40 mo
Battery Remaining Percentage: 38 %
Battery Voltage: 2.9 V
Brady Statistic RV Percent Paced: 1 %
Date Time Interrogation Session: 20230428015038
HighPow Impedance: 80 Ohm
HighPow Impedance: 80 Ohm
Implantable Lead Implant Date: 20150717
Implantable Lead Location: 753860
Implantable Pulse Generator Implant Date: 20150717
Lead Channel Impedance Value: 340 Ohm
Lead Channel Pacing Threshold Amplitude: 1 V
Lead Channel Pacing Threshold Pulse Width: 0.5 ms
Lead Channel Sensing Intrinsic Amplitude: 11.8 mV
Lead Channel Setting Pacing Amplitude: 2.5 V
Lead Channel Setting Pacing Pulse Width: 0.5 ms
Lead Channel Setting Sensing Sensitivity: 0.5 mV
Pulse Gen Serial Number: 7196009

## 2021-10-17 ENCOUNTER — Other Ambulatory Visit: Payer: Self-pay | Admitting: Internal Medicine

## 2021-10-17 DIAGNOSIS — E1142 Type 2 diabetes mellitus with diabetic polyneuropathy: Secondary | ICD-10-CM

## 2021-10-18 NOTE — Progress Notes (Signed)
Remote ICD transmission.   

## 2021-10-20 ENCOUNTER — Other Ambulatory Visit: Payer: Self-pay | Admitting: Internal Medicine

## 2021-10-20 ENCOUNTER — Encounter: Payer: Self-pay | Admitting: Internal Medicine

## 2021-10-20 MED ORDER — DAPAGLIFLOZIN PROPANEDIOL 5 MG PO TABS: 5.0000 mg | ORAL_TABLET | Freq: Every day | ORAL | 0 refills | Status: DC

## 2021-10-21 ENCOUNTER — Ambulatory Visit (INDEPENDENT_AMBULATORY_CARE_PROVIDER_SITE_OTHER): Payer: Medicaid Other

## 2021-10-21 ENCOUNTER — Ambulatory Visit: Payer: Medicaid Other | Admitting: Internal Medicine

## 2021-10-21 DIAGNOSIS — I5022 Chronic systolic (congestive) heart failure: Secondary | ICD-10-CM

## 2021-10-21 DIAGNOSIS — Z95810 Presence of automatic (implantable) cardiac defibrillator: Secondary | ICD-10-CM | POA: Diagnosis not present

## 2021-10-21 NOTE — Progress Notes (Signed)
EPIC Encounter for ICM Monitoring  Patient Name: [name] is a 56 y.o. male Date: 10/21/2021 Primary Care Physican: [name], MD Primary Cardiologist: Stanford Breed Electrophysiologist: Allred 07/03/2021 Weight 258 lbs 09/11/2021 Weight: 262 lbs 10/21/2021 Weight: Not weighing at home                            Spoke with patient and heart failure questions reviewed.  Pt reports he missed Furosemide dosages on Saturday due to he ran out but that is the only day he missed.  He feels fine and denies fluid symptoms.    Corvue thoracic impedance suggesting possible fluid accumulation starting 5/11.   Prescribed dosage:  Furosemide 80 mg take 1 tablet (80 mg total) twice a day.    Potassium 10 mEq take 1 tablet daily.    Labs: 08/26/2021 Creatinine 2.08, BUN 20, Potassium 3.9, Sodium 137, GFR 37, A1C 13% 01/14/2021 Creatinine 1.82, BUN 11, Potassium 3.8, Sodium 143, GFR 43 Complete set of lab results in result review.    Recommendations:  Advised to limit salt intake.  Copy sent to Dr Stanford Breed for review and recommendations if needed.     Follow-up plan: ICM clinic phone appointment on 10/29/2021 to recheck fluid levels.   91 day device clinic remote transmission 11/22/2021.     EP/Cardiology Office Visits: 01/09/2022 with [name].  Recall 09/15/2021 with Oda Kilts, PA   Copy of ICM check sent to Dr. Rayann Heman.   3 month ICM trend: 10/21/2021.    12-14 Month ICM trend:     [name], RN 10/21/2021 4:11 PM

## 2021-10-29 ENCOUNTER — Ambulatory Visit (INDEPENDENT_AMBULATORY_CARE_PROVIDER_SITE_OTHER): Payer: Medicaid Other

## 2021-10-29 DIAGNOSIS — Z95810 Presence of automatic (implantable) cardiac defibrillator: Secondary | ICD-10-CM

## 2021-10-29 DIAGNOSIS — I5022 Chronic systolic (congestive) heart failure: Secondary | ICD-10-CM

## 2021-10-29 NOTE — Progress Notes (Signed)
EPIC Encounter for ICM Monitoring  Patient Name: [name] is a 56 y.o. male Date: 10/29/2021 Primary Care Physican: [name], MD Primary Cardiologist: Stanford Breed Electrophysiologist: Allred 07/03/2021 Weight 258 lbs 09/11/2021 Weight: 262 lbs 10/21/2021 Weight: Not weighing at home                            Spoke with patient and heart failure questions reviewed.  Pt asymptomatic for fluid accumulation.     Corvue thoracic impedance suggesting fluid levels changed from possible accumulation to possible dryness starting 5/17.   Prescribed dosage:  Furosemide 80 mg take 1 tablet (80 mg total) twice a day.    Potassium 10 mEq take 1 tablet daily.    Labs: 08/26/2021 Creatinine 2.08, BUN 20, Potassium 3.9, Sodium 137, GFR 37, A1C 13% 01/14/2021 Creatinine 1.82, BUN 11, Potassium 3.8, Sodium 143, GFR 43 Complete set of lab results in result review.    Recommendations:  Encouraged to drink 64 oz fluid daily to help maintain balanced fluid levels.       Follow-up plan: ICM clinic phone appointment on 11/06/2021 to recheck fluid levels.   91 day device clinic remote transmission 11/22/2021.     EP/Cardiology Office Visits: 01/09/2022 with [name].  Recall 09/15/2021 with Oda Kilts, PA   Copy of ICM check sent to Dr. Rayann Heman.   3 month ICM trend: 10/28/2021.    12-14 Month ICM trend:     [name], RN 10/29/2021 11:03 AM

## 2021-11-06 ENCOUNTER — Ambulatory Visit (INDEPENDENT_AMBULATORY_CARE_PROVIDER_SITE_OTHER): Payer: Medicaid Other

## 2021-11-08 NOTE — Progress Notes (Signed)
EPIC Encounter for ICM Monitoring  Patient Name: [name] is a 56 y.o. male Date: 11/08/2021 Primary Care Physican: [name], MD Primary Cardiologist: Stanford Breed Electrophysiologist: Allred 07/03/2021 Weight 258 lbs 09/11/2021 Weight: 262 lbs 10/21/2021 Weight: Not weighing at home                            Spoke with patient and heart failure questions reviewed.  Pt asymptomatic for fluid accumulation.  Reports feeling well at this time and voices no complaints.    Corvue thoracic impedance suggesting fluid levels returned normal.   Prescribed dosage:  Furosemide 80 mg take 1 tablet (80 mg total) twice a day.    Potassium 10 mEq take 1 tablet daily.    Labs: 08/26/2021 Creatinine 2.08, BUN 20, Potassium 3.9, Sodium 137, GFR 37, A1C 13% 01/14/2021 Creatinine 1.82, BUN 11, Potassium 3.8, Sodium 143, GFR 43 Complete set of lab results in result review.    Recommendations:  No changes and encouraged to call if experiencing any fluid symptoms.   Follow-up plan: ICM clinic phone appointment on 12/02/2021.   91 day device clinic remote transmission 11/22/2021.     EP/Cardiology Office Visits: 01/09/2022 with [name].  Recall 09/15/2021 with Oda Kilts, PA   Copy of ICM check sent to Dr. Rayann Heman.  3 month ICM trend: 11/06/2021.    12-14 Month ICM trend:     [name], RN 11/08/2021 4:55 PM

## 2021-11-19 ENCOUNTER — Other Ambulatory Visit: Payer: Self-pay | Admitting: Allergy

## 2021-11-19 ENCOUNTER — Other Ambulatory Visit: Payer: Self-pay | Admitting: Internal Medicine

## 2021-11-19 ENCOUNTER — Encounter: Payer: Self-pay | Admitting: Internal Medicine

## 2021-11-19 ENCOUNTER — Encounter: Payer: Self-pay | Admitting: Allergy

## 2021-11-19 DIAGNOSIS — Z794 Long term (current) use of insulin: Secondary | ICD-10-CM

## 2021-11-20 ENCOUNTER — Ambulatory Visit: Payer: Medicaid Other | Admitting: Allergy

## 2021-11-20 MED ORDER — CETIRIZINE HCL 10 MG PO TABS: 10.0000 mg | ORAL_TABLET | Freq: Every day | ORAL | 0 refills | Status: DC

## 2021-12-02 ENCOUNTER — Ambulatory Visit (INDEPENDENT_AMBULATORY_CARE_PROVIDER_SITE_OTHER): Payer: Medicaid Other

## 2021-12-02 DIAGNOSIS — Z95810 Presence of automatic (implantable) cardiac defibrillator: Secondary | ICD-10-CM | POA: Diagnosis not present

## 2021-12-02 DIAGNOSIS — I5022 Chronic systolic (congestive) heart failure: Secondary | ICD-10-CM | POA: Diagnosis not present

## 2021-12-06 NOTE — Progress Notes (Signed)
EPIC Encounter for ICM Monitoring  Patient Name: [name] is a 56 y.o. male Date: 12/06/2021 Primary Care Physican: [name], MD Primary Cardiologist: Stanford Breed Electrophysiologist: Allred 07/03/2021 Weight 258 lbs 09/11/2021 Weight: 262 lbs 10/21/2021 Weight: Not weighing at home                            Attempted call to patient and unable to reach.  Left detailed message per DPR regarding transmission. Transmission reviewed.    Corvue thoracic impedance suggesting normal fluid levels.     Prescribed dosage:  Furosemide 80 mg take 1 tablet (80 mg total) twice a day.    Potassium 10 mEq take 1 tablet daily.    Labs: 08/26/2021 Creatinine 2.08, BUN 20, Potassium 3.9, Sodium 137, GFR 37, A1C 13% Complete set of lab results in result review.    Recommendations:  Left voice mail with ICM number and encouraged to call if experiencing any fluid symptoms.    Follow-up plan: ICM clinic phone appointment on 01/06/2022.   91 day device clinic remote transmission 01/03/2022.     EP/Cardiology Office Visits: 01/09/2022 with [name].  Recall 09/15/2021 with Oda Kilts, PA   Copy of ICM check sent to Dr. Rayann Heman.  3 month ICM trend: 12/03/2021.    12-14 Month ICM trend:     [name], RN 12/06/2021 3:02 PM

## 2021-12-19 ENCOUNTER — Encounter: Payer: Self-pay | Admitting: Internal Medicine

## 2021-12-19 ENCOUNTER — Other Ambulatory Visit: Payer: Self-pay | Admitting: Internal Medicine

## 2021-12-19 MED ORDER — METHOCARBAMOL 500 MG PO TABS: ORAL_TABLET | ORAL | 3 refills | Status: DC

## 2021-12-30 NOTE — Progress Notes (Addendum)
HPI: FU CM/CHF. Echo 2/15 revealed EF of 20-25% with diffuse hypokinesis and biatrial enlargement, mild MR and a small pericardial effusion. TSH in February 2015 normal. Cardiac catheterization in March of 2015 showed normal coronary arteries. Also with prior ICD. CPX August 2017 showed submaximal effort with moderate functional impairment primarily circulatory limited. Last echocardiogram August 2022 showed ejection fraction 30%, severe left ventricular enlargement, grade 1 diastolic dysfunction, mild mitral regurgitation, mildly dilated aortic root at 41 mm.  Since last seen, he complains of dyspnea on exertion.  No orthopnea, PND or pedal edema.  He continues to have occasional chest pain.  It is in the left chest area.  It occurs spontaneously and increases with leaning over.  It lasts seconds.  Current Outpatient Medications  Medication Sig Dispense Refill   gabapentin (NEURONTIN) 300 MG capsule Take 1 capsule by mouth at bedtime 90 capsule 1   ACCU-CHEK FASTCLIX LANCETS MISC Use to check blood sugar 3 times daily. 102 each 11   aspirin 81 MG tablet Take 81 mg by mouth daily.     atorvastatin (LIPITOR) 10 MG tablet Take 1 tablet (10 mg total) by mouth daily. 90 tablet 1   benzonatate (TESSALON) 100 MG capsule Take 1 capsule (100 mg total) by mouth 2 (two) times daily as needed for cough. 40 capsule 0   carvedilol (COREG) 6.25 MG tablet Take 1 tablet (6.25 mg total) by mouth 2 (two) times daily. 60 tablet 7   cetirizine (ZYRTEC) 10 MG tablet Take 1 tablet (10 mg total) by mouth daily. 90 tablet 0   dapagliflozin propanediol (FARXIGA) 5 MG TABS tablet Take 1 tablet (5 mg total) by mouth daily before breakfast. 90 tablet 0   EPINEPHrine 0.3 mg/0.3 mL IJ SOAJ injection Inject 0.3 mg into the muscle as needed. 1 each 2   famotidine (PEPCID) 20 MG tablet Take 1 tablet by mouth once daily 90 tablet 0   fluticasone (FLONASE) 50 MCG/ACT nasal spray Use 1-2 sprays in each nostril once a day as  needed for nasal congestion. 16 g 5   furosemide (LASIX) 80 MG tablet Take 1 tablet (80 mg total) by mouth 2 (two) times daily. 180 tablet 3   gabapentin (NEURONTIN) 300 MG capsule TAKE 1 CAPSULE BY MOUTH EVERY DAY AT BEDTIME 90 capsule 1   glucose blood (ACCU-CHEK GUIDE) test strip Use as instructed to check blood sugar 3 times daily. 100 each 11   JANUVIA 25 MG tablet Take 1 tablet by mouth once daily 90 tablet 0   Lancet Devices (ACCU-CHEK SOFTCLIX) lancets 1 each by Other route 3 (three) times daily. 1 each 0   levalbuterol (XOPENEX HFA) 45 MCG/ACT inhaler Inhale 2 puffs into the lungs every 6 (six) hours as needed for wheezing or shortness of breath. 15 g 1   methocarbamol (ROBAXIN) 500 MG tablet TAKE 1 TABLET BY MOUTH ONCE DAILY AS NEEDED FOR MUSCLE SPASM 30 tablet 3   mometasone (ELOCON) 0.1 % cream Apply 1 application topically daily. 45 g 1   potassium chloride (KLOR-CON) 10 MEQ tablet Take 10 mEq by mouth daily.     sacubitril-valsartan (ENTRESTO) 24-26 MG Take 1 tablet by mouth 2 (two) times daily. 180 tablet 3   sildenafil (VIAGRA) 100 MG tablet Take 1-2 tabs PO 1/2-1 hr prior to intercourse. 20 tablet 4   tamsulosin (FLOMAX) 0.4 MG CAPS capsule Take 2 capsules (0.8 mg total) by mouth daily. 180 capsule 1   triamcinolone cream (KENALOG)  0.1 % Apply sparingly to red itchy areas twice daily as needed. Avoid face, neck, armpits or groin area. Do not use more than 3 weeks in a row. 45 g 0   No current facility-administered medications for this visit.     Past Medical History:  Diagnosis Date   AICD (automatic cardioverter/defibrillator) present    Chronic bronchitis ([place])    "get it most q yr" (12/23/2013)   Chronic systolic (congestive) heart failure ([place])    Fracture of left humerus    a. 07/2013.   GERD (gastroesophageal reflux disease)    not at present time   Heart murmur    "born w/it"    History of renal calculi    Hypertension    Kidney stones    NICM (nonischemic  cardiomyopathy) ([place])    a. 07/2013 Echo: EF 20-25%, diff HK, Gr2 DD, mild MR, mod dil LA/RA. 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    Shockable heart rhythm detected by automated external defibrillator    Syncope    Type II diabetes mellitus ([place]) 2006    Past Surgical History:  Procedure Laterality Date   CARDIAC CATHETERIZATION  08/10/13   CHOLECYSTECTOMY  ~ 2010   IMPLANTABLE CARDIOVERTER DEFIBRILLATOR IMPLANT  12/23/2013   STJ Fortify ICD implanted by [name] for cardiomyopathy and syncope   IMPLANTABLE CARDIOVERTER DEFIBRILLATOR IMPLANT N/A 12/23/2013   Procedure: IMPLANTABLE CARDIOVERTER DEFIBRILLATOR IMPLANT;  Surgeon: Coralyn Mark, MD;  Location: [facility] CATH LAB;  Service: Cardiovascular;  Laterality: N/A;   INGUINAL HERNIA REPAIR Left 03/01/2018   Procedure: OPEN REPAIR OF LEFT INGUINAL HERNIA WITH MESH;  Surgeon: Greer Pickerel, MD;  Location: [facility] ORS;  Service: General;  Laterality: Left;   LEFT HEART CATHETERIZATION WITH CORONARY ANGIOGRAM N/A 08/10/2013   Procedure: LEFT HEART CATHETERIZATION WITH CORONARY ANGIOGRAM;  Surgeon: Jettie Booze, MD;  Location: [facility] CATH LAB;  Service: Cardiovascular;  Laterality: N/A;   ORIF HUMERUS FRACTURE Left 08/12/2013   Procedure: OPEN REDUCTION INTERNAL FIXATION (ORIF) HUMERAL SHAFT FRACTURE;  Surgeon: Newt Minion, MD;  Location: [facility];  Service: Orthopedics;  Laterality: Left;  Open Reduction Internal Fixation Left Humerus   URETEROSCOPY     "laser for kidney stones"    Social History   Socioeconomic History   Marital status: Single    Spouse name: Not on file   Number of children: Not on file   Years of education: Not on file   Highest education level: Not on file  Occupational History   Occupation: Disabled  Tobacco Use   Smoking status: Former    Packs/day: 1.00    Years: 28.00    Total pack years: 28.00    Types: Cigarettes    Quit date: 06/03/2014     Years since quitting: 7.6   Smokeless tobacco: Never  Vaping Use   Vaping Use: Never used  Substance and Sexual Activity   Alcohol use: Yes    Alcohol/week: 0.0 standard drinks of alcohol    Comment: 12/23/2013 "might drink a couple times/yr"   Drug use: Yes    Types: Marijuana    Comment: 02/25/18- patient  states has not used marjuana   Sexual activity: Yes  Other Topics Concern   Not on file  Social History Narrative   Pt lives with his brother    Social Determinants of Health   Financial Resource Strain: Not on file  Food  Insecurity: No Food Insecurity (12/11/2020)   Hunger Vital Sign    Worried About Running Out of Food in the Last Year: Never true    Ran Out of Food in the Last Year: Never true  Transportation Needs: No Transportation Needs (03/12/2021)   PRAPARE - Hydrologist (Medical): No    Lack of Transportation (Non-Medical): No  Physical Activity: Not on file  Stress: Not on file  Social Connections: Moderately Integrated (03/12/2021)   Social Connection and Isolation Panel [NHANES]    Frequency of Communication with Friends and Family: More than three times a week    Frequency of Social Gatherings with Friends and Family: More than three times a week    Attends Religious Services: More than 4 times per year    Active Member of [employer] or Organizations: Yes    Attends Archivist Meetings: Never    Marital Status: Never married  Human resources officer Violence: Not on file    Family History  Problem Relation Age of Onset   Diabetes Father    Arthritis Father    Hypertension Father    Diabetes Mother    Arthritis Mother    Hypertension Mother    Hypertension Brother     ROS: no fevers or chills, productive cough, hemoptysis, dysphasia, odynophagia, melena, hematochezia, dysuria, hematuria, rash, seizure activity, orthopnea, PND, pedal edema, claudication. Remaining systems are negative.  Physical Exam: Well-developed  well-nourished in no acute distress.  Skin is warm and dry.  HEENT is normal.  Neck is supple.  Chest is clear to auscultation with normal expansion.  Cardiovascular exam is regular rate and rhythm.  Abdominal exam nontender or distended. No masses palpated. Extremities show no edema. neuro grossly intact  ECG-normal sinus rhythm at a rate of 72, right axis deviation, no significant ST changes.  Personally reviewed  A/P  1 nonischemic cardiomyopathy-continue Entresto and carvedilol.  2 chronic systolic congestive heart failure-he appears to be euvolemic on examination today.  Continue diuretic at present dose.  Continue Farxiga.  Check potassium and renal function.  Given his complaints of dyspnea we will check BNP.  He has multiple complaints somewhat difficult to assess.  3 chronic chest pain-patient continues to have occasional brief chest pain that does not sound cardiac.  We will follow.  4 hyperlipidemia-continue statin.  5 ICD-Per EP.  6 chronic stage III kidney disease-Per nephrology.  [name], MD

## 2022-01-01 ENCOUNTER — Other Ambulatory Visit: Payer: Self-pay | Admitting: Allergy

## 2022-01-01 NOTE — Telephone Encounter (Signed)
Patient scheduled appointment for 01-30-2022. Pt is requesting courtesy refill for triamcinolone and famotadine. Advised pt if appt is rescheduled or he no shows no additional refills will be sent in. Patient verbalized understanding.   [address]

## 2022-01-03 ENCOUNTER — Other Ambulatory Visit: Payer: Self-pay | Admitting: *Deleted

## 2022-01-03 MED ORDER — TRIAMCINOLONE ACETONIDE 0.1 % EX CREA: TOPICAL_CREAM | CUTANEOUS | 0 refills | Status: DC

## 2022-01-06 ENCOUNTER — Ambulatory Visit (INDEPENDENT_AMBULATORY_CARE_PROVIDER_SITE_OTHER): Payer: Medicaid Other

## 2022-01-06 DIAGNOSIS — Z95810 Presence of automatic (implantable) cardiac defibrillator: Secondary | ICD-10-CM

## 2022-01-06 DIAGNOSIS — I5022 Chronic systolic (congestive) heart failure: Secondary | ICD-10-CM

## 2022-01-09 ENCOUNTER — Ambulatory Visit (INDEPENDENT_AMBULATORY_CARE_PROVIDER_SITE_OTHER): Payer: Medicaid Other | Admitting: Cardiology

## 2022-01-09 ENCOUNTER — Encounter: Payer: Self-pay | Admitting: Cardiology

## 2022-01-09 VITALS — BP 110/80 | HR 72 | Ht 72.0 in | Wt 262.8 lb

## 2022-01-09 DIAGNOSIS — N182 Chronic kidney disease, stage 2 (mild): Secondary | ICD-10-CM | POA: Diagnosis not present

## 2022-01-09 DIAGNOSIS — I5022 Chronic systolic (congestive) heart failure: Secondary | ICD-10-CM

## 2022-01-09 DIAGNOSIS — I10 Essential (primary) hypertension: Secondary | ICD-10-CM

## 2022-01-09 DIAGNOSIS — I428 Other cardiomyopathies: Secondary | ICD-10-CM

## 2022-01-09 DIAGNOSIS — R0789 Other chest pain: Secondary | ICD-10-CM

## 2022-01-09 NOTE — Progress Notes (Signed)
EPIC Encounter for ICM Monitoring  Patient Name: [name] is a 56 y.o. male Date: 01/09/2022 Primary Care Physican: [name], MD Primary Cardiologist: Stanford Breed Electrophysiologist: Allred 07/03/2021 Weight 258 lbs 09/11/2021 Weight: 262 lbs 10/21/2021 Weight: Not weighing at home 01/09/2022 Office Weight: 262 lbs                            Spoke with patient and heart failure questions reviewed.  Pt asymptomatic for fluid accumulation.  Reports feeling well at this time and voices no complaints. Transmission reviewed.    Corvue thoracic impedance suggesting normal fluid levels.     Prescribed dosage:  Furosemide 80 mg take 1 tablet (80 mg total) twice a day.    Potassium 10 mEq take 1 tablet daily.    Labs: 08/26/2021 Creatinine 2.08, BUN 20, Potassium 3.9, Sodium 137, GFR 37, A1C 13% Complete set of lab results in result review.    Recommendations:  No changes and encouraged to call if experiencing any fluid symptoms.   Follow-up plan: ICM clinic phone appointment on 02/11/2022.   91 day device clinic remote transmission 04/04/2022.     EP/Cardiology Office Visits: Recall 07/08/2022 with [name].  Recall 09/15/2021 with Oda Kilts, PA   Copy of ICM check sent to Dr. Rayann Heman.  3 month ICM trend: 01/07/2022.    12-14 Month ICM trend:     [name], RN 01/09/2022 9:27 AM

## 2022-01-09 NOTE — Patient Instructions (Signed)

## 2022-01-10 LAB — PRO B NATRIURETIC PEPTIDE: NT-Pro BNP: 97 pg/mL (ref 0–210)

## 2022-01-10 LAB — BASIC METABOLIC PANEL WITH GFR
BUN/Creatinine Ratio: 10 (ref 9–20)
BUN: 19 mg/dL (ref 6–24)
CO2: 26 mmol/L (ref 20–29)
Calcium: 9.4 mg/dL (ref 8.7–10.2)
Chloride: 95 mmol/L — ABNORMAL LOW (ref 96–106)
Creatinine, Ser: 1.91 mg/dL — ABNORMAL HIGH (ref 0.76–1.27)
Glucose: 424 mg/dL — ABNORMAL HIGH (ref 70–99)
Potassium: 4.1 mmol/L (ref 3.5–5.2)
Sodium: 135 mmol/L (ref 134–144)
eGFR: 40 mL/min/1.73 — ABNORMAL LOW

## 2022-01-14 ENCOUNTER — Ambulatory Visit (INDEPENDENT_AMBULATORY_CARE_PROVIDER_SITE_OTHER): Payer: Medicaid Other

## 2022-01-14 DIAGNOSIS — I428 Other cardiomyopathies: Secondary | ICD-10-CM | POA: Diagnosis not present

## 2022-01-14 LAB — CUP PACEART REMOTE DEVICE CHECK
Battery Remaining Longevity: 37 mo
Battery Remaining Percentage: 36 %
Battery Voltage: 2.9 V
Brady Statistic RV Percent Paced: 1 %
Date Time Interrogation Session: 20230807111817
HighPow Impedance: 72 Ohm
HighPow Impedance: 72 Ohm
Implantable Lead Implant Date: 20150717
Implantable Lead Location: 753860
Implantable Pulse Generator Implant Date: 20150717
Lead Channel Impedance Value: 310 Ohm
Lead Channel Pacing Threshold Amplitude: 1 V
Lead Channel Pacing Threshold Pulse Width: 0.5 ms
Lead Channel Sensing Intrinsic Amplitude: 11.8 mV
Lead Channel Setting Pacing Amplitude: 2.5 V
Lead Channel Setting Pacing Pulse Width: 0.5 ms
Lead Channel Setting Sensing Sensitivity: 0.5 mV
Pulse Gen Serial Number: 7196009

## 2022-01-18 ENCOUNTER — Encounter: Payer: Self-pay | Admitting: Internal Medicine

## 2022-01-18 DIAGNOSIS — E1169 Type 2 diabetes mellitus with other specified complication: Secondary | ICD-10-CM

## 2022-01-18 DIAGNOSIS — N138 Other obstructive and reflux uropathy: Secondary | ICD-10-CM

## 2022-01-19 MED ORDER — ATORVASTATIN CALCIUM 10 MG PO TABS: 10.0000 mg | ORAL_TABLET | Freq: Every day | ORAL | 1 refills | Status: DC

## 2022-01-19 MED ORDER — DAPAGLIFLOZIN PROPANEDIOL 5 MG PO TABS: 5.0000 mg | ORAL_TABLET | Freq: Every day | ORAL | 1 refills | Status: DC

## 2022-01-19 MED ORDER — TAMSULOSIN HCL 0.4 MG PO CAPS: 0.8000 mg | ORAL_CAPSULE | Freq: Every day | ORAL | 1 refills | Status: DC

## 2022-01-19 NOTE — Telephone Encounter (Signed)
Refills sent

## 2022-01-30 ENCOUNTER — Encounter: Payer: Self-pay | Admitting: Internal Medicine

## 2022-01-30 ENCOUNTER — Ambulatory Visit: Payer: Medicaid Other | Attending: Internal Medicine | Admitting: Internal Medicine

## 2022-01-30 ENCOUNTER — Ambulatory Visit (INDEPENDENT_AMBULATORY_CARE_PROVIDER_SITE_OTHER): Payer: Medicaid Other | Admitting: Allergy & Immunology

## 2022-01-30 ENCOUNTER — Encounter: Payer: Self-pay | Admitting: Allergy & Immunology

## 2022-01-30 VITALS — BP 86/58 | HR 80 | Temp 97.0°F | Resp 18 | Ht 72.0 in | Wt 258.2 lb

## 2022-01-30 VITALS — BP 110/76 | HR 74 | Ht 72.0 in | Wt 251.2 lb

## 2022-01-30 DIAGNOSIS — H1013 Acute atopic conjunctivitis, bilateral: Secondary | ICD-10-CM

## 2022-01-30 DIAGNOSIS — L509 Urticaria, unspecified: Secondary | ICD-10-CM | POA: Diagnosis not present

## 2022-01-30 DIAGNOSIS — E1165 Type 2 diabetes mellitus with hyperglycemia: Secondary | ICD-10-CM | POA: Diagnosis not present

## 2022-01-30 DIAGNOSIS — R0602 Shortness of breath: Secondary | ICD-10-CM | POA: Diagnosis not present

## 2022-01-30 DIAGNOSIS — J302 Other seasonal allergic rhinitis: Secondary | ICD-10-CM

## 2022-01-30 DIAGNOSIS — T63481D Toxic effect of venom of other arthropod, accidental (unintentional), subsequent encounter: Secondary | ICD-10-CM

## 2022-01-30 LAB — POCT URINALYSIS DIP (CLINITEK)
Bilirubin, UA: NEGATIVE
Blood, UA: NEGATIVE
Glucose, UA: NEGATIVE mg/dL
Ketones, POC UA: NEGATIVE mg/dL
Leukocytes, UA: NEGATIVE
Nitrite, UA: NEGATIVE
POC PROTEIN,UA: NEGATIVE
Spec Grav, UA: <=1.005 — AB (ref 1.010–1.025)
Urobilinogen, UA: 0.2 E.U./dL
pH, UA: 5.5 (ref 5.0–8.0)

## 2022-01-30 LAB — POCT GLYCOSYLATED HEMOGLOBIN (HGB A1C): HbA1c, POC (controlled diabetic range): 15 % — AB (ref 0.0–7.0)

## 2022-01-30 LAB — GLUCOSE, POCT (MANUAL RESULT ENTRY): POC Glucose: >600 mg/dl (ref 70–99)

## 2022-01-30 MED ORDER — NOVOLOG FLEXPEN 100 UNIT/ML ~~LOC~~ SOPN: PEN_INJECTOR | SUBCUTANEOUS | 11 refills | Status: DC

## 2022-01-30 MED ORDER — FLUTICASONE PROPIONATE 50 MCG/ACT NA SUSP
NASAL | 5 refills | Status: DC
Start: 2022-01-30 — End: 2023-08-07

## 2022-01-30 MED ORDER — FREESTYLE LIBRE 2 SENSOR MISC: 6 refills | Status: DC

## 2022-01-30 MED ORDER — ACCU-CHEK GUIDE W/DEVICE KIT: PACK | 0 refills | Status: DC

## 2022-01-30 MED ORDER — CLOBETASOL PROPIONATE 0.05 % EX OINT: 1.0000 | TOPICAL_OINTMENT | Freq: Two times a day (BID) | CUTANEOUS | 3 refills | Status: DC

## 2022-01-30 MED ORDER — EPINEPHRINE 0.3 MG/0.3ML IJ SOAJ: 0.3000 mg | INTRAMUSCULAR | 1 refills | Status: AC | PRN

## 2022-01-30 MED ORDER — LANTUS SOLOSTAR 100 UNIT/ML ~~LOC~~ SOPN: 10.0000 [IU] | PEN_INJECTOR | Freq: Every day | SUBCUTANEOUS | 99 refills | Status: DC

## 2022-01-30 MED ORDER — LEVALBUTEROL TARTRATE 45 MCG/ACT IN AERO: 2.0000 | INHALATION_SPRAY | Freq: Four times a day (QID) | RESPIRATORY_TRACT | 1 refills | Status: DC | PRN

## 2022-01-30 MED ORDER — CETIRIZINE HCL 10 MG PO TABS
10.0000 mg | ORAL_TABLET | Freq: Every day | ORAL | 1 refills | Status: DC
Start: 2022-01-30 — End: 2022-09-04

## 2022-01-30 MED ORDER — TRIAMCINOLONE ACETONIDE 0.1 % EX CREA
TOPICAL_CREAM | CUTANEOUS | 5 refills | Status: DC
Start: 2022-01-30 — End: 2023-02-27

## 2022-01-30 MED ORDER — ACCU-CHEK GUIDE VI STRP: ORAL_STRIP | 12 refills | Status: DC

## 2022-01-30 MED ORDER — FREESTYLE LIBRE 2 READER DEVI: 0 refills | Status: DC

## 2022-01-30 MED ORDER — ACCU-CHEK SOFTCLIX LANCETS MISC: 12 refills | Status: DC

## 2022-01-30 NOTE — Progress Notes (Signed)
FOLLOW UP  Date of Service/Encounter:  01/30/22   Assessment:   Chronic urticaria  Seasonal and perennial allergic rhinitis (dust mites, cat, dog, grass, mold, cockroach, trees)  Stinging insect anaphylaxis (paper wasp)  Shortness of breath - in the setting of CHF  Plan/Recommendations:   Urticaria Continue Zyrtec (ceterizine) 10mg  up to twice daily as needed.  Continue Pepcid (famotidine) 20mg  up to twice daily as needed.  Add on clobetasol twice daily on the feet (use for two weeks at a time only and then take a break).  Call us with an update after TWO WEEKS!  Avoid the following potential triggers: alcohol, tight clothing, NSAIDs.    Seasonal and perennial allergic rhinoconjunctivitis (dust mites, cat, dog, grass, mold, cockroach, trees) Continue zyrtec 10mg  daily. May use Flonase 1-2 sprays in each nostril once a day as needed for nasal symptoms.  Continue environmental control measures.    Anaphylaxis due to hymenoptera venom Avoid insect stings. We will send in an EpiPen refill.   Shortness of breath Monitor symptoms.  Continue to see your CHF doctor.   Return in about 6 months (around 08/02/2022).   Subjective:   [name] is a 56 y.o. male presenting today for follow up of  Chief Complaint  Patient presents with   Follow-up    [name] has a history of the following: Patient Active Problem List   Diagnosis Date Noted   CKD (chronic kidney disease) stage 2, GFR 60-89 ml/min 10/24/2019   Acute non-recurrent frontal sinusitis 06/27/2019   COVID-19 virus infection 06/27/2019   Glaucoma suspect 12/22/2018   Seasonal and perennial allergic rhinoconjunctivitis 10/25/2018   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   Urticaria 05/03/2018   S/P inguinal hernia repair 03/01/2018   Unilateral inguinal hernia without obstruction or gangrene 07/24/2017   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   Hypersomnia 02/28/2016    Abnormal PFT 02/24/2016   Shortness of breath 01/28/2016   Diabetic polyneuropathy associated with type 2 diabetes mellitus ([place]) 12/18/2015   Lumbar back pain 12/18/2015   Controlled type 2 diabetes mellitus with complication, with long-term current use of insulin ([place]) 09/17/2015   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  [name] is a 56 y.o. male presenting for a follow up visit. he was last seen in December 2022 by Dr. Maudie Mercury.  At that time, hives were not under great control.  He was continued on Zyrtec 10 mg and Pepcid 20 mg up to twice daily.  He was encouraged to avoid triggers including alcohol, tight clothing, and NSAIDs.  For his allergic rhinitis, he was continued on the Flonase.  This in combination with the cetirizine seem to be controlling his symptoms.  He has a history of anaphylaxis to hymenoptera.  He was on venom immunotherapy for a period of time.  He was endorsing shortness of breath which she felt was secondary to his CHF.  Since last visit,  he has done well.  Asthma/Respiratory Symptom History: He has SOB every day. He has had albuterol to use but he does not use it a lot. This is thought to be from his CHF.  He has never been on the asthma controller medication.  He continues to follow with cardiology.  He sees Dr. [name].  His last visit was in early August 2023.  At that time, he was  continued on Entresto and carvedilol.  He had a BNP checked which was normal.  Allergic Rhinitis Symptom History: He is on cetirizine daily as well as fluticasone.  He has not been on allergen immunotherapy through our practice.  He has not had any sinus infections.   Skin Symptom History: He is on the ceitrizine daily. He also takes famotidine . He has been having breakouts of hives on the bottom of his feet. He has put some athletes' foot spray on it but it does not help at all. He denies new exposures. He has used TAC without improvement in the symptoms.   Otherwise, there have been no changes to his past medical history, surgical history, family history, or social history.    Review of Systems  Constitutional: Negative.  Negative for chills, fever, malaise/fatigue and weight loss.  HENT: Negative.  Negative for congestion, ear discharge and ear pain.   Eyes:  Negative for pain, discharge and redness.  Respiratory:  Negative for cough, sputum production, shortness of breath and wheezing.   Cardiovascular: Negative.  Negative for chest pain and palpitations.  Gastrointestinal:  Negative for abdominal pain, constipation, diarrhea, heartburn, nausea and vomiting.  Skin: Negative.  Negative for itching and rash.  Neurological:  Negative for dizziness and headaches.  Endo/Heme/Allergies:  Negative for environmental allergies. Does not bruise/bleed easily.       Objective:   Blood pressure (!) 86/58, pulse 80, temperature (!) 97 F (36.1 C), temperature source Temporal, resp. rate 18, height 6' (1.829 m), weight 258 lb 3.2 oz (117.1 kg), SpO2 93 %. Body mass index is 35.02 kg/m.    Physical Exam Vitals reviewed.  Constitutional:      Appearance: He is well-developed.  HENT:     Head: Normocephalic and atraumatic.     Right Ear: Tympanic membrane, ear canal and external ear normal.     Left Ear: Tympanic membrane, ear canal and external ear normal.     Nose: No nasal deformity,  septal deviation, mucosal edema or rhinorrhea.     Right Turbinates: Enlarged and swollen.     Left Turbinates: Enlarged and swollen.     Right Sinus: No maxillary sinus tenderness or frontal sinus tenderness.     Left Sinus: No maxillary sinus tenderness or frontal sinus tenderness.     Mouth/Throat:     Mouth: Mucous membranes are not pale and not dry.     Pharynx: Uvula midline.  Eyes:     General: Lids are normal. No allergic shiner.       Right eye: No discharge.        Left eye: No discharge.     Conjunctiva/sclera: Conjunctivae normal.     Right eye: Right conjunctiva is not injected. No chemosis.    Left eye: Left conjunctiva is not injected. No chemosis.    Pupils: Pupils are equal, round, and reactive to light.  Cardiovascular:     Rate and Rhythm: Normal rate and regular rhythm.     Heart sounds: Normal heart sounds.  Pulmonary:  Effort: Pulmonary effort is normal. No tachypnea, accessory muscle usage or respiratory distress.     Breath sounds: Normal breath sounds. No wheezing, rhonchi or rales.  Chest:     Chest wall: No tenderness.  Lymphadenopathy:     Cervical: No cervical adenopathy.  Skin:    Coloration: Skin is not pale.     Findings: No abrasion, erythema, petechiae or rash. Rash is not papular, urticarial or vesicular.  Neurological:     Mental Status: He is alert.  Psychiatric:        Behavior: Behavior is cooperative.      Diagnostic studies: none       Salvatore Marvel, MD  [facility] and [facility] [place]

## 2022-01-30 NOTE — Patient Instructions (Addendum)
Your blood sugar is reading as high on the glucometer indicating that your levels are greater than 600.  Your A1c is 15.  I recommend being seen in the emergency room to have this evaluated further and to be given insulin to get your blood sugars down.  I will send a prescription to your pharmacy for Lantus insulin to take 10 units at bedtime.  This is the long-acting insulin. Use the NovoLog insulin 3 to 4 units with your 2 largest meals of the day. I have sent prescription to your pharmacy for the [facility] device which is a continuous glucose monitor.

## 2022-01-30 NOTE — Patient Instructions (Addendum)
Urticaria Continue Zyrtec (ceterizine) 10mg  up to twice daily as needed.  Continue Pepcid (famotidine) 20mg  up to twice daily as needed.  Add on clobetasol twice daily on the feet (use for two weeks at a time only and then take a break).  Call us with an update after TWO WEEKS!  Avoid the following potential triggers: alcohol, tight clothing, NSAIDs.    Seasonal and perennial allergic rhinoconjunctivitis (dust mites, cat, dog, grass, mold, cockroach, trees) Continue zyrtec 10mg  daily. May use Flonase 1-2 sprays in each nostril once a day as needed for nasal symptoms.  Continue environmental control measures.    Anaphylaxis due to hymenoptera venom Avoid insect stings. We will send in an EpiPen refill.   Shortness of breath Monitor symptoms.  Continue to see your CHF doctor.    Return in about 6 months (around 08/02/2022).    Please inform us of any Emergency Department visits, hospitalizations, or changes in symptoms. Call us before going to the ED for breathing or allergy symptoms since we might be able to fit you in for a sick visit. Feel free to contact us anytime with any questions, problems, or concerns.  It was a pleasure to meet you today!  Websites that have reliable patient information: 1. American Academy of Asthma, Allergy, and Immunology: www.aaaai.org 2. Food Allergy Research and Education (FARE): foodallergy.org 3. Mothers of Asthmatics: http://www.asthmacommunitynetwork.org 4. American College of Allergy, Asthma, and Immunology: www.acaai.org   COVID-19 Vaccine Information can be found at: ShippingScam.co.uk For questions related to vaccine distribution or appointments, please email vaccine@[email or web address] .[email or web address] or call [phone].   We realize that you might be concerned about having an allergic reaction to the COVID19 vaccines. To help with that concern, WE ARE OFFERING THE COVID19 VACCINES IN OUR OFFICE! Ask  the front desk for dates!     "Like" [place] on Facebook and Instagram for our latest updates!      A healthy democracy works best when [employer] participate! Make sure you are registered to vote! If you have moved or changed any of your contact information, you will need to get this updated before voting!  In some cases, you MAY be able to register to vote online: [email or web address]

## 2022-01-30 NOTE — Progress Notes (Signed)
Patient ID: [name], male    DOB: [birth year]  MRN: [id number]  CC: Diabetes   Subjective: [name] is a 56 y.o. male who presents for chronic ds management His concerns today include:  Patient with history of NICM, systolic CHF s/p ICD placement, DM type 2, HL, CKD stage 3,  obesity, BPH, GERD and chronic LBP secondary to spondylosis and spinal stenosis, COVID-19 infection.  DM:  Lab Results  Component Value Date   HGBA1C 15.0 (A) 01/30/2022  Patient is on [place].  Reports compliance with medications.  A1c has been increasing over the past year.  He has been trying to avoid being on any injectable medication. BS reading as HIGH today on our device, A!C >15 Not checking BS.  Machine old Endorses polyuria/polydipsia.  No blurred vision.  Wgh is down 11 lbs since earlier this mth he feels his blood sugar is high because he has not been able to exercise due to dyspnea on exertion.  States that he feels like he is going to faint all the time.  Associated with irregular heartbeats.  He saw his cardiologist [name] earlier this month.  Advised to continue Entresto and carvedilol. -Reports being under a lot of stress taking care of his parents.   Patient Active Problem List   Diagnosis Date Noted   CKD (chronic kidney disease) stage 2, GFR 60-89 ml/min 10/24/2019   Acute non-recurrent frontal sinusitis 06/27/2019   COVID-19 virus infection 06/27/2019   Glaucoma suspect 12/22/2018   Seasonal and perennial allergic rhinoconjunctivitis 10/25/2018   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   Urticaria 05/03/2018   S/P inguinal hernia repair 03/01/2018   Unilateral inguinal hernia without obstruction or gangrene 07/24/2017   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   Hypersomnia 02/28/2016   Abnormal PFT 02/24/2016   Shortness of breath 01/28/2016   Diabetic polyneuropathy associated with type 2 diabetes mellitus ([place]) 12/18/2015    Lumbar back pain 12/18/2015   Controlled type 2 diabetes mellitus with complication, with long-term current use of insulin ([place]) 09/17/2015   Left median nerve neuropathy 04/16/2015   Lesion of right median nerve at forearm 04/16/2015   ICD (implantable cardioverter-defibrillator) in place 09/32/6712   Chronic systolic congestive heart failure ([place]) 03/28/2014   Nonischemic cardiomyopathy ([place]) 11/09/2013   Syncope 10/20/2013   Chronic chest pain 07/28/2013   RENAL CALCULUS 05/13/2009   Obesity 05/08/2009   Former heavy cigarette smoker (20-39 per day) 05/08/2009   HOARSENESS 05/08/2009   ANXIETY 02/28/2008   ASTHMATIC BRONCHITIS, ACUTE 02/28/2008   GERD 02/28/2008   IRRITABLE BOWEL SYNDROME 02/28/2008     Current Outpatient Medications on File Prior to Visit  Medication Sig Dispense Refill   ACCU-CHEK FASTCLIX LANCETS MISC Use to check blood sugar 3 times daily. 102 each 11   aspirin 81 MG tablet Take 81 mg by mouth daily.     atorvastatin (LIPITOR) 10 MG tablet Take 1 tablet (10 mg total) by mouth daily. 90 tablet 1   benzonatate (TESSALON) 100 MG capsule Take 1 capsule (100 mg total) by mouth 2 (two) times daily as needed for cough. 40 capsule 0   carvedilol (COREG) 6.25 MG tablet Take 1 tablet (6.25 mg total) by mouth 2 (two) times daily. 60 tablet 7   dapagliflozin propanediol (FARXIGA) 5 MG TABS tablet Take 1 tablet (5 mg total) by mouth daily before breakfast. 90 tablet 1   famotidine (PEPCID) 20 MG tablet Take 1 tablet by  mouth once daily 90 tablet 0   furosemide (LASIX) 80 MG tablet Take 1 tablet (80 mg total) by mouth 2 (two) times daily. 180 tablet 3   gabapentin (NEURONTIN) 300 MG capsule TAKE 1 CAPSULE BY MOUTH EVERY DAY AT BEDTIME 90 capsule 1   gabapentin (NEURONTIN) 300 MG capsule Take 1 capsule by mouth at bedtime 90 capsule 1   glucose blood (ACCU-CHEK GUIDE) test strip Use as instructed to check blood sugar 3 times daily. 100 each 11   JANUVIA 25 MG tablet Take 1  tablet by mouth once daily 90 tablet 0   Lancet Devices (ACCU-CHEK SOFTCLIX) lancets 1 each by Other route 3 (three) times daily. 1 each 0   methocarbamol (ROBAXIN) 500 MG tablet TAKE 1 TABLET BY MOUTH ONCE DAILY AS NEEDED FOR MUSCLE SPASM 30 tablet 3   mometasone (ELOCON) 0.1 % cream Apply 1 application topically daily. 45 g 1   potassium chloride (KLOR-CON) 10 MEQ tablet Take 10 mEq by mouth daily.     sacubitril-valsartan (ENTRESTO) 24-26 MG Take 1 tablet by mouth 2 (two) times daily. 180 tablet 3   sildenafil (VIAGRA) 100 MG tablet Take 1-2 tabs PO 1/2-1 hr prior to intercourse. 20 tablet 4   tamsulosin (FLOMAX) 0.4 MG CAPS capsule Take 2 capsules (0.8 mg total) by mouth daily. 180 capsule 1   No current facility-administered medications on file prior to visit.    Allergies  Allergen Reactions   Bee Venom Anaphylaxis    Social History   Socioeconomic History   Marital status: Single    Spouse name: Not on file   Number of children: Not on file   Years of education: Not on file   Highest education level: Not on file  Occupational History   Occupation: Disabled  Tobacco Use   Smoking status: Former    Packs/day: 1.00    Years: 28.00    Total pack years: 28.00    Types: Cigarettes    Quit date: 06/03/2014    Years since quitting: 7.6   Smokeless tobacco: Never  Vaping Use   Vaping Use: Never used  Substance and Sexual Activity   Alcohol use: Yes    Alcohol/week: 0.0 standard drinks of alcohol    Comment: 12/23/2013 "might drink a couple times/yr"   Drug use: Yes    Types: Marijuana    Comment: 02/25/18- patient  states has not used marjuana   Sexual activity: Yes  Other Topics Concern   Not on file  Social History Narrative   Pt lives with his brother    Social Determinants of Health   Financial Resource Strain: Not on file  Food Insecurity: No Food Insecurity (12/11/2020)   Hunger Vital Sign    Worried About Running Out of Food in the Last Year: Never true     Ran Out of Food in the Last Year: Never true  Transportation Needs: No Transportation Needs (03/12/2021)   PRAPARE - Hydrologist (Medical): No    Lack of Transportation (Non-Medical): No  Physical Activity: Not on file  Stress: Not on file  Social Connections: Moderately Integrated (03/12/2021)   Social Connection and Isolation Panel [NHANES]    Frequency of Communication with Friends and Family: More than three times a week    Frequency of Social Gatherings with Friends and Family: More than three times a week    Attends Religious Services: More than 4 times per year    Active Member of  Clubs or Organizations: Yes    Attends Archivist Meetings: Never    Marital Status: Never married  Human resources officer Violence: Not on file    Family History  Problem Relation Age of Onset   Diabetes Father    Arthritis Father    Hypertension Father    Diabetes Mother    Arthritis Mother    Hypertension Mother    Hypertension Brother     Past Surgical History:  Procedure Laterality Date   CARDIAC CATHETERIZATION  08/10/13   CHOLECYSTECTOMY  ~ 2010   IMPLANTABLE CARDIOVERTER DEFIBRILLATOR IMPLANT  12/23/2013   STJ Fortify ICD implanted by [name] for cardiomyopathy and syncope   IMPLANTABLE CARDIOVERTER DEFIBRILLATOR IMPLANT N/A 12/23/2013   Procedure: IMPLANTABLE CARDIOVERTER DEFIBRILLATOR IMPLANT;  Surgeon: Coralyn Mark, MD;  Location: [facility] CATH [facility];  Service: Cardiovascular;  Laterality: N/A;   INGUINAL HERNIA REPAIR Left 03/01/2018   Procedure: OPEN REPAIR OF LEFT INGUINAL HERNIA WITH MESH;  Surgeon: Greer Pickerel, MD;  Location: [facility];  Service: General;  Laterality: Left;   LEFT HEART CATHETERIZATION WITH CORONARY ANGIOGRAM N/A 08/10/2013   Procedure: LEFT HEART CATHETERIZATION WITH CORONARY ANGIOGRAM;  Surgeon: Jettie Booze, MD;  Location[facility] CATH LAB;  Service: Cardiovascular;  Laterality: N/A;   ORIF HUMERUS FRACTURE Left 08/12/2013   Procedure:  OPEN REDUCTION INTERNAL FIXATION (ORIF) HUMERAL SHAFT FRACTURE;  Surgeon: Newt Minion, MD;  Location: [facility];  Service: Orthopedics;  Laterality: Left;  Open Reduction Internal Fixation Left Humerus   URETEROSCOPY     "laser for kidney stones"    ROS: Review of Systems Negative except as stated above  PHYSICAL EXAM: BP 110/76   Pulse 74   Ht 6' (1.829 m)   Wt 251 lb 3.2 oz (113.9 kg)   SpO2 98%   BMI 34.07 kg/m   Wt Readings from Last 3 Encounters:  01/30/22 251 lb 3.2 oz (113.9 kg)  01/30/22 258 lb 3.2 oz (117.1 kg)  01/09/22 262 lb 12.8 oz (119.2 kg)    Physical Exam  General appearance - alert, well appearing, middle-aged older African-American male and in no distress Mental status - normal mood, behavior, speech, dress, motor activity, and thought processes Chest - clear to auscultation, no wheezes, rales or rhonchi, symmetric air entry Heart - normal rate, regular rhythm, normal S1, S2, no murmurs, rubs, clicks or gallops Extremities -no lower extremity edema.  Results for orders placed or performed in visit on 01/30/22  POCT glucose (manual entry)  Result Value Ref Range   POC Glucose >600 70 - 99 mg/dl  POCT glycosylated hemoglobin (Hb A1C)  Result Value Ref Range   Hemoglobin A1C     HbA1c POC (<> result, manual entry)     HbA1c, POC (prediabetic range)     HbA1c, POC (controlled diabetic range) 15.0 (A) 0.0 - 7.0 %  POCT URINALYSIS DIP (CLINITEK)  Result Value Ref Range   Color, UA yellow yellow   Clarity, UA clear clear   Glucose, UA negative negative mg/dL   Bilirubin, UA negative negative   Ketones, POC UA negative negative mg/dL   Spec Grav, UA <=1.005 (A) 1.010 - 1.025   Blood, UA negative negative   pH, UA 5.5 5.0 - 8.0   POC PROTEIN,UA negative negative, trace   Urobilinogen, UA 0.2 0.2 or 1.0 E.U./dL   Nitrite, UA Negative Negative   Leukocytes, UA Negative Negative       Latest Ref Rng & Units 01/09/2022  10:02 AM 08/26/2021    4:08 PM  01/14/2021    4:49 PM  CMP  Glucose 70 - 99 mg/dL 424  348  238   BUN 6 - 24 mg/dL 19  20  11    Creatinine 0.76 - 1.27 mg/dL 1.91  2.08  1.82   Sodium 134 - 144 mmol/L 135  137  143   Potassium 3.5 - 5.2 mmol/L 4.1  3.9  3.8   Chloride 96 - 106 mmol/L 95  96  102   CO2 20 - 29 mmol/L 26  23  25    Calcium 8.7 - 10.2 mg/dL 9.4  9.4  10.1   Total Protein 6.0 - 8.5 g/dL  8.0    Total Bilirubin 0.0 - 1.2 mg/dL  0.6    Alkaline Phos 44 - 121 IU/L  80    AST 0 - 40 IU/L  12    ALT 0 - 44 IU/L  15     Lipid Panel     Component Value Date/Time   CHOL 178 08/26/2021 1608   TRIG 336 (H) 08/26/2021 1608   HDL 42 08/26/2021 1608   CHOLHDL 4.2 08/26/2021 1608   CHOLHDL 4 05/08/2009 1040   VLDL 27.0 05/08/2009 1040   LDLCALC 81 08/26/2021 1608    CBC    Component Value Date/Time   WBC 6.3 08/26/2021 1608   WBC 4.7 02/26/2018 1134   RBC 4.64 08/26/2021 1608   RBC 4.06 (L) 02/26/2018 1134   HGB 14.0 08/26/2021 1608   HCT 40.9 08/26/2021 1608   PLT 286 08/26/2021 1608   MCV 88 08/26/2021 1608   MCH 30.2 08/26/2021 1608   MCH 30.0 02/26/2018 1134   MCHC 34.2 08/26/2021 1608   MCHC 33.3 02/26/2018 1134   RDW 13.0 08/26/2021 1608   LYMPHSABS 1.7 06/28/2018 1440   MONOABS 0.6 09/21/2017 2158   EOSABS 0.3 06/28/2018 1440   BASOSABS 0.0 06/28/2018 1440    ASSESSMENT AND PLAN: 1. Type 2 diabetes mellitus with hyperglycemia, without long-term current use of insulin (HCC) Pt's BS registering as HIGH.  His A1c is greater than 15.  He is endorsing polyuria and polydipsia.  Advised patient to be seen in the emergency room as he may need to be given intravenous insulin to get his blood sugars down. -Moving forward, advised that we start basal insulin called Lantus to take at bedtime and mealtime insulin: NovoLog to take with the 2 largest meals of the day. -Patient states he would rather not be on insulin but if he has to he will do it.  I think we are limited in adding any of the oral diabetes  medication due to his kidney function.  Also they will not get his blood sugars down/diabetes controlled quickly enough. -We will try to get him a continuous glucose monitor but will also send prescription for manual glucometer in case insurance does not approve for CGM. -Recommended that he returns tomorrow or on Monday for clinical pharmacist to show him how to do the insulin administration with insulin pen. - POCT glucose (manual entry) - POCT glycosylated hemoglobin (Hb A1C) - insulin glargine (LANTUS SOLOSTAR) 100 UNIT/ML Solostar Pen; Inject 10 Units into the skin daily.  Dispense: 15 mL; Refill: PRN - insulin aspart (NOVOLOG FLEXPEN) 100 UNIT/ML FlexPen; Inj 3-4 units with the two largest meals of the day subcut.  Dispense: 15 mL; Refill: 11 - Continuous Blood Gluc Receiver (FREESTYLE LIBRE 2 READER) DEVI; UAD  Dispense: 1 each; Refill:  0 - Continuous Blood Gluc Sensor (FREESTYLE LIBRE 2 SENSOR) MISC; Change device every 2 weeks  Dispense: 2 each; Refill: 6 - POCT URINALYSIS DIP (CLINITEK)     Patient was given the opportunity to ask questions.  Patient verbalized understanding of the plan and was able to repeat key elements of the plan.   This documentation was completed using Radio producer.  Any transcriptional errors are unintentional.  Orders Placed This Encounter  Procedures   POCT glucose (manual entry)   POCT glycosylated hemoglobin (Hb A1C)   POCT URINALYSIS DIP (CLINITEK)     Requested Prescriptions   Signed Prescriptions Disp Refills   insulin glargine (LANTUS SOLOSTAR) 100 UNIT/ML Solostar Pen 15 mL PRN    Sig: Inject 10 Units into the skin daily.   insulin aspart (NOVOLOG FLEXPEN) 100 UNIT/ML FlexPen 15 mL 11    Sig: Inj 3-4 units with the two largest meals of the day subcut.   Continuous Blood Gluc Receiver (FREESTYLE LIBRE 2 READER) DEVI 1 each 0    Sig: UAD   Continuous Blood Gluc Sensor (FREESTYLE LIBRE 2 SENSOR) MISC 2 each 6    Sig:  Change device every 2 weeks    Return in about 2 weeks (around 02/13/2022).  Karle Plumber, MD, FACP

## 2022-01-31 ENCOUNTER — Encounter: Payer: Self-pay | Admitting: Allergy & Immunology

## 2022-02-01 ENCOUNTER — Other Ambulatory Visit: Payer: Self-pay

## 2022-02-01 ENCOUNTER — Emergency Department (HOSPITAL_BASED_OUTPATIENT_CLINIC_OR_DEPARTMENT_OTHER): Payer: Medicaid Other

## 2022-02-01 ENCOUNTER — Emergency Department (HOSPITAL_BASED_OUTPATIENT_CLINIC_OR_DEPARTMENT_OTHER)
Admission: EM | Admit: 2022-02-01 | Discharge: 2022-02-01 | Disposition: A | Discharge status: Home / Self Care | Payer: Medicaid Other | Attending: Emergency Medicine | Admitting: Emergency Medicine

## 2022-02-01 ENCOUNTER — Encounter (HOSPITAL_BASED_OUTPATIENT_CLINIC_OR_DEPARTMENT_OTHER): Payer: Self-pay

## 2022-02-01 DIAGNOSIS — E1122 Type 2 diabetes mellitus with diabetic chronic kidney disease: Secondary | ICD-10-CM | POA: Diagnosis not present

## 2022-02-01 DIAGNOSIS — Z794 Long term (current) use of insulin: Secondary | ICD-10-CM | POA: Insufficient documentation

## 2022-02-01 DIAGNOSIS — R9431 Abnormal electrocardiogram [ECG] [EKG]: Secondary | ICD-10-CM | POA: Diagnosis not present

## 2022-02-01 DIAGNOSIS — Z7982 Long term (current) use of aspirin: Secondary | ICD-10-CM | POA: Insufficient documentation

## 2022-02-01 DIAGNOSIS — R739 Hyperglycemia, unspecified: Secondary | ICD-10-CM | POA: Diagnosis not present

## 2022-02-01 DIAGNOSIS — E1165 Type 2 diabetes mellitus with hyperglycemia: Secondary | ICD-10-CM

## 2022-02-01 DIAGNOSIS — Z8616 Personal history of COVID-19: Secondary | ICD-10-CM | POA: Diagnosis not present

## 2022-02-01 DIAGNOSIS — Z87891 Personal history of nicotine dependence: Secondary | ICD-10-CM | POA: Diagnosis not present

## 2022-02-01 DIAGNOSIS — N182 Chronic kidney disease, stage 2 (mild): Secondary | ICD-10-CM | POA: Insufficient documentation

## 2022-02-01 DIAGNOSIS — Z79899 Other long term (current) drug therapy: Secondary | ICD-10-CM | POA: Diagnosis not present

## 2022-02-01 DIAGNOSIS — R0602 Shortness of breath: Secondary | ICD-10-CM | POA: Diagnosis not present

## 2022-02-01 DIAGNOSIS — I130 Hypertensive heart and chronic kidney disease with heart failure and stage 1 through stage 4 chronic kidney disease, or unspecified chronic kidney disease: Secondary | ICD-10-CM | POA: Diagnosis not present

## 2022-02-01 DIAGNOSIS — Z7984 Long term (current) use of oral hypoglycemic drugs: Secondary | ICD-10-CM | POA: Insufficient documentation

## 2022-02-01 DIAGNOSIS — I5022 Chronic systolic (congestive) heart failure: Secondary | ICD-10-CM | POA: Insufficient documentation

## 2022-02-01 LAB — CBC
HCT: 39.9 % (ref 39.0–52.0)
Hemoglobin: 13.8 g/dL (ref 13.0–17.0)
MCH: 30.9 pg (ref 26.0–34.0)
MCHC: 34.6 g/dL (ref 30.0–36.0)
MCV: 89.5 fL (ref 80.0–100.0)
Platelets: 288 10*3/uL (ref 150–400)
RBC: 4.46 MIL/uL (ref 4.22–5.81)
RDW: 13.4 % (ref 11.5–15.5)
WBC: 6.4 10*3/uL (ref 4.0–10.5)
nRBC: 0 % (ref 0.0–0.2)

## 2022-02-01 LAB — I-STAT VENOUS BLOOD GAS, ED
Acid-Base Excess: 3 mmol/L — ABNORMAL HIGH (ref 0.0–2.0)
Bicarbonate: 27.7 mmol/L (ref 20.0–28.0)
Calcium, Ion: 1.16 mmol/L (ref 1.15–1.40)
HCT: 40 % (ref 39.0–52.0)
Hemoglobin: 13.6 g/dL (ref 13.0–17.0)
O2 Saturation: 87 %
Patient temperature: 98.6
Potassium: 3.5 mmol/L (ref 3.5–5.1)
Sodium: 131 mmol/L — ABNORMAL LOW (ref 135–145)
TCO2: 29 mmol/L (ref 22–32)
pCO2, Ven: 43 mmHg — ABNORMAL LOW (ref 44–60)
pH, Ven: 7.417 (ref 7.25–7.43)
pO2, Ven: 52 mmHg — ABNORMAL HIGH (ref 32–45)

## 2022-02-01 LAB — BASIC METABOLIC PANEL
Anion gap: 14 (ref 5–15)
BUN: 32 mg/dL — ABNORMAL HIGH (ref 6–20)
CO2: 24 mmol/L (ref 22–32)
Calcium: 9.5 mg/dL (ref 8.9–10.3)
Chloride: 93 mmol/L — ABNORMAL LOW (ref 98–111)
Creatinine, Ser: 2.3 mg/dL — ABNORMAL HIGH (ref 0.61–1.24)
GFR, Estimated: 32 mL/min — ABNORMAL LOW (ref >60)
Glucose, Bld: 495 mg/dL — ABNORMAL HIGH (ref 70–99)
Potassium: 3.4 mmol/L — ABNORMAL LOW (ref 3.5–5.1)
Sodium: 131 mmol/L — ABNORMAL LOW (ref 135–145)

## 2022-02-01 LAB — URINALYSIS, ROUTINE W REFLEX MICROSCOPIC
Bilirubin Urine: NEGATIVE
Glucose, UA: >1000 mg/dL — AB
Hgb urine dipstick: NEGATIVE
Ketones, ur: NEGATIVE mg/dL
Leukocytes,Ua: NEGATIVE
Nitrite: NEGATIVE
Protein, ur: NEGATIVE mg/dL
Specific Gravity, Urine: 1.02 (ref 1.005–1.030)
pH: 5 (ref 5.0–8.0)

## 2022-02-01 LAB — BRAIN NATRIURETIC PEPTIDE: B Natriuretic Peptide: 11.8 pg/mL (ref 0.0–100.0)

## 2022-02-01 LAB — CBG MONITORING, ED
Glucose-Capillary: 508 mg/dL (ref 70–99)
Glucose-Capillary: 425 mg/dL — ABNORMAL HIGH (ref 70–99)
Glucose-Capillary: 349 mg/dL — ABNORMAL HIGH (ref 70–99)

## 2022-02-01 LAB — BETA-HYDROXYBUTYRIC ACID: Beta-Hydroxybutyric Acid: 0.19 mmol/L (ref 0.05–0.27)

## 2022-02-01 MED ORDER — POTASSIUM CHLORIDE 20 MEQ PO PACK
60.0000 meq | PACK | Freq: Once | ORAL | Status: DC
Start: 2022-02-01 — End: 2022-02-01

## 2022-02-01 MED ORDER — INSULIN ASPART 100 UNIT/ML IJ SOLN
10.0000 [IU] | Freq: Once | INTRAMUSCULAR | Status: AC
  Administered 2022-02-01: 10 [IU] via INTRAVENOUS

## 2022-02-01 MED ORDER — ACCU-CHEK SOFTCLIX LANCETS MISC: 12 refills | Status: DC

## 2022-02-01 MED ORDER — POTASSIUM CHLORIDE CRYS ER 20 MEQ PO TBCR
60.0000 meq | EXTENDED_RELEASE_TABLET | Freq: Once | ORAL | Status: AC
  Administered 2022-02-01: 60 meq via ORAL
  Filled 2022-02-01: qty 3

## 2022-02-01 MED ORDER — NOVOLOG FLEXPEN 100 UNIT/ML ~~LOC~~ SOPN: PEN_INJECTOR | SUBCUTANEOUS | 11 refills | Status: DC

## 2022-02-01 MED ORDER — FREESTYLE LIBRE 2 READER DEVI: 0 refills | Status: DC

## 2022-02-01 MED ORDER — FREESTYLE LIBRE 2 SENSOR MISC: 6 refills | Status: DC

## 2022-02-01 MED ORDER — ACCU-CHEK GUIDE VI STRP: ORAL_STRIP | 12 refills | Status: DC

## 2022-02-01 MED ORDER — LACTATED RINGERS IV BOLUS
500.0000 mL | Freq: Once | INTRAVENOUS | Status: AC
  Administered 2022-02-01: 500 mL via INTRAVENOUS

## 2022-02-01 MED ORDER — ACCU-CHEK GUIDE W/DEVICE KIT: PACK | 0 refills | Status: DC

## 2022-02-01 MED ORDER — LANTUS SOLOSTAR 100 UNIT/ML ~~LOC~~ SOPN: 10.0000 [IU] | PEN_INJECTOR | Freq: Every day | SUBCUTANEOUS | 99 refills | Status: DC

## 2022-02-01 NOTE — Discharge Instructions (Signed)
Evaluated you in the emergency department for your high blood sugar.  Your lab tests were overall reassuring.  You had a very mild kidney injury, please follow-up with your primary doctor for recheck in 1 week.  This is probably because you were dehydrated.  Please take the insulin prescribed by your primary doctor to help control your blood sugar.  Return to the emergency department if you develop any vomiting, fainting, trouble breathing, or any other concerning symptoms.

## 2022-02-01 NOTE — ED Provider Notes (Signed)
[facility] EMERGENCY DEPT Provider Note  CSN: [id number] Arrival date & time: 02/01/22 1631  Chief Complaint(s) Hyperglycemia  HPI [name] is a 56 y.o. male with history of CHF, hypertension, diabetes presenting to the emergency department with hyperglycemia.  He saw his primary doctor 2 days ago, who found that his glucose was over 600, advised him to present to the emergency department.  Patient reports he was unable to come to the emergency department until today as his mother was sick.  His primary doctor also prescribed him insulin but he did not pick it up although he is not really able to explain why he did not pick it up.  He brought the discharge instructions with him which states that he has been prescribed insulin.  The patient reports polyuria, polydipsia, fatigue, generalized weakness.  Reported shortness of breath to triage, denies shortness of breath currently.  Denies nausea, vomiting.  Reports otherwise compliant with his medications.  Symptoms are mild   Past Medical History Past Medical History:  Diagnosis Date   AICD (automatic cardioverter/defibrillator) present    Chronic bronchitis ([place])    "get it most q yr" (12/23/2013)   Chronic systolic (congestive) heart failure ([place])    Fracture of left humerus    a. 07/2013.   GERD (gastroesophageal reflux disease)    not at present time   Heart murmur    "born w/it"    History of renal calculi    Hypertension    Kidney stones    NICM (nonischemic cardiomyopathy) ([place])    a. 07/2013 Echo: EF 20-25%, diff HK, Gr2 DD, mild MR, mod dil LA/RA. 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    Shockable heart rhythm detected by automated external defibrillator    Syncope    Type II diabetes mellitus ([place]) 2006   Patient Active Problem List   Diagnosis Date Noted   CKD (chronic kidney disease) stage 2, GFR 60-89 ml/min 10/24/2019    Acute non-recurrent frontal sinusitis 06/27/2019   COVID-19 virus infection 06/27/2019   Glaucoma suspect 12/22/2018   Seasonal and perennial allergic rhinoconjunctivitis 10/25/2018   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   Urticaria 05/03/2018   S/P inguinal hernia repair 03/01/2018   Unilateral inguinal hernia without obstruction or gangrene 07/24/2017   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   Hypersomnia 02/28/2016   Abnormal PFT 02/24/2016   Shortness of breath 01/28/2016   Diabetic polyneuropathy associated with type 2 diabetes mellitus ([place]) 12/18/2015   Lumbar back pain 12/18/2015   Controlled type 2 diabetes mellitus with complication, with long-term current use of insulin ([place]) 09/17/2015   Left median nerve neuropathy 04/16/2015   Lesion of right median nerve at forearm 04/16/2015   ICD (implantable cardioverter-defibrillator) in place 74/16/3845   Chronic systolic congestive heart failure ([place]) 03/28/2014   Nonischemic cardiomyopathy ([place]) 11/09/2013   Syncope 10/20/2013   Chronic chest pain 07/28/2013   RENAL CALCULUS 05/13/2009   Obesity 05/08/2009   Former heavy cigarette smoker (20-39 per day) 05/08/2009   HOARSENESS 05/08/2009   ANXIETY 02/28/2008   ASTHMATIC BRONCHITIS, ACUTE 02/28/2008   GERD 02/28/2008   IRRITABLE BOWEL SYNDROME 02/28/2008   Home Medication(s) Prior to Admission medications   Medication Sig Start Date End Date Taking? Authorizing Provider  Accu-Chek Softclix Lancets lancets Use as instructed 02/01/22   Cristie Hem, MD  aspirin 81 MG tablet Take 81 mg  by mouth daily.    [provider]  atorvastatin (LIPITOR) 10 MG tablet Take 1 tablet (10 mg total) by mouth daily. 01/19/22   Elsie Stain, MD  benzonatate (TESSALON) 100 MG capsule Take 1 capsule (100 mg total) by mouth 2 (two) times daily as needed for cough. 07/10/21   [name], MD  Blood Glucose Monitoring Suppl (ACCU-CHEK GUIDE)  w/Device KIT UAD 02/01/22   Cristie Hem, MD  carvedilol (COREG) 6.25 MG tablet Take 1 tablet (6.25 mg total) by mouth 2 (two) times daily. 09/19/21   Lelon Perla, MD  cetirizine (ZYRTEC) 10 MG tablet Take 1 tablet (10 mg total) by mouth daily. 01/30/22   Valentina Shaggy, MD  clobetasol ointment (TEMOVATE) 6.19 % Apply 1 Application topically 2 (two) times daily. 01/30/22   Valentina Shaggy, MD  Continuous Blood Gluc Receiver (FREESTYLE LIBRE 2 READER) DEVI UAD 02/01/22   Cristie Hem, MD  Continuous Blood Gluc Sensor (FREESTYLE LIBRE 2 SENSOR) MISC Change device every 2 weeks 02/01/22   Cristie Hem, MD  dapagliflozin propanediol (FARXIGA) 5 MG TABS tablet Take 1 tablet (5 mg total) by mouth daily before breakfast. 01/19/22   Elsie Stain, MD  EPINEPHrine 0.3 mg/0.3 mL IJ SOAJ injection Inject 0.3 mg into the muscle as needed. 01/30/22   Valentina Shaggy, MD  famotidine (PEPCID) 20 MG tablet Take 1 tablet by mouth once daily 01/01/22   Valentina Shaggy, MD  fluticasone [facility]) 50 MCG/ACT nasal spray Use 1-2 sprays in each nostril once a day as needed for nasal congestion. 01/30/22   Valentina Shaggy, MD  furosemide (LASIX) 80 MG tablet Take 1 tablet (80 mg total) by mouth 2 (two) times daily. 04/12/21   Lelon Perla, MD  gabapentin (NEURONTIN) 300 MG capsule TAKE 1 CAPSULE BY MOUTH EVERY DAY AT BEDTIME 10/05/20   [name], MD  gabapentin (NEURONTIN) 300 MG capsule Take 1 capsule by mouth at bedtime 10/17/21   [name], MD  glucose blood (ACCU-CHEK GUIDE) test strip Use as instructed 02/01/22   Cristie Hem, MD  insulin aspart (NOVOLOG FLEXPEN) 100 UNIT/ML FlexPen Inj 3-4 units with the two largest meals of the day subcut. 02/01/22   Cristie Hem, MD  insulin glargine (LANTUS SOLOSTAR) 100 UNIT/ML Solostar Pen Inject 10 Units into the skin daily. 02/01/22   Cristie Hem, MD  JANUVIA 25 MG tablet Take 1 tablet by  mouth once daily 11/20/21   [name], MD  Lancet Devices [facility]) lancets 1 each by Other route 3 (three) times daily. 10/20/14   Boykin Nearing, MD  levalbuterol [facility] HFA) 45 MCG/ACT inhaler Inhale 2 puffs into the lungs every 6 (six) hours as needed for wheezing or shortness of breath. 01/30/22   Valentina Shaggy, MD  methocarbamol (ROBAXIN) 500 MG tablet TAKE 1 TABLET BY MOUTH ONCE DAILY AS NEEDED FOR MUSCLE SPASM 12/19/21   [name], MD  mometasone (ELOCON) 0.1 % cream Apply 1 application topically daily. 06/17/19   Marzetta Board, DPM  potassium chloride (KLOR-CON) 10 MEQ tablet Take 10 mEq by mouth daily. 11/02/20   [provider]  sacubitril-valsartan (ENTRESTO) 24-26 MG Take 1 tablet by mouth 2 (two) times daily. 04/12/21   Lelon Perla, MD  sildenafil (VIAGRA) 100 MG tablet Take 1-2 tabs PO 1/2-1 hr prior to intercourse. 12/14/20   [name], MD  tamsulosin (FLOMAX) 0.4 MG  CAPS capsule Take 2 capsules (0.8 mg total) by mouth daily. 01/19/22   Elsie Stain, MD  triamcinolone cream (KENALOG) 0.1 % Apply sparingly to red itchy areas twice daily as needed. Avoid face, neck, armpits or groin area. Do not use more than 3 weeks in a row. 01/30/22   Valentina Shaggy, MD                                                                                                                                    Past Surgical History Past Surgical History:  Procedure Laterality Date   CARDIAC CATHETERIZATION  08/10/13   CHOLECYSTECTOMY  ~ 2010   IMPLANTABLE CARDIOVERTER DEFIBRILLATOR IMPLANT  12/23/2013   STJ Fortify ICD implanted by [name] for cardiomyopathy and syncope   IMPLANTABLE CARDIOVERTER DEFIBRILLATOR IMPLANT N/A 12/23/2013   Procedure: IMPLANTABLE CARDIOVERTER DEFIBRILLATOR IMPLANT;  Surgeon: Coralyn Mark, MD;  Location: [facility] CATH LAB;  Service: Cardiovascular;  Laterality: N/A;   INGUINAL HERNIA REPAIR Left 03/01/2018    Procedure: OPEN REPAIR OF LEFT INGUINAL HERNIA WITH MESH;  Surgeon: Greer Pickerel, MD;  Location: [facility] ORS;  Service: General;  Laterality: Left;   LEFT HEART CATHETERIZATION WITH CORONARY ANGIOGRAM N/A 08/10/2013   Procedure: LEFT HEART CATHETERIZATION WITH CORONARY ANGIOGRAM;  Surgeon: Jettie Booze, MD;  Location: [facility] CATH LAB;  Service: Cardiovascular;  Laterality: N/A;   ORIF HUMERUS FRACTURE Left 08/12/2013   Procedure: OPEN REDUCTION INTERNAL FIXATION (ORIF) HUMERAL SHAFT FRACTURE;  Surgeon: Newt Minion, MD;  Location: [facility];  Service: Orthopedics;  Laterality: Left;  Open Reduction Internal Fixation Left Humerus   URETEROSCOPY     "laser for kidney stones"   Family History Family History  Problem Relation Age of Onset   Diabetes Father    Arthritis Father    Hypertension Father    Diabetes Mother    Arthritis Mother    Hypertension Mother    Hypertension Brother     Social History Social History   Tobacco Use   Smoking status: Former    Packs/day: 1.00    Years: 28.00    Total pack years: 28.00    Types: Cigarettes    Quit date: 06/03/2014    Years since quitting: 7.6   Smokeless tobacco: Never  Vaping Use   Vaping Use: Never used  Substance Use Topics   Alcohol use: Yes    Alcohol/week: 0.0 standard drinks of alcohol    Comment: 12/23/2013 "might drink a couple times/yr"   Drug use: Yes    Types: Marijuana    Comment: 02/25/18- patient  states has not used marjuana   Allergies Bee venom  Review of Systems Review of Systems  All other systems reviewed and are negative.   Physical Exam Vital Signs  I have reviewed the triage vital signs BP 110/70   Pulse 73   Temp 98.2 F (36.8 C) (Oral)   Resp Marland Kitchen)  21   Ht 6' (1.829 m)   Wt 113.9 kg   SpO2 93%   BMI 34.06 kg/m  Physical Exam Vitals and nursing note reviewed.  Constitutional:      General: He is not in acute distress.    Appearance: Normal appearance.  HENT:     Mouth/Throat:     Mouth: Mucous  membranes are dry.  Eyes:     Conjunctiva/sclera: Conjunctivae normal.  Cardiovascular:     Rate and Rhythm: Normal rate and regular rhythm.  Pulmonary:     Effort: Pulmonary effort is normal. No respiratory distress.     Breath sounds: Normal breath sounds.  Abdominal:     General: Abdomen is flat.     Palpations: Abdomen is soft.     Tenderness: There is no abdominal tenderness.  Musculoskeletal:     Right lower leg: No edema.     Left lower leg: No edema.  Skin:    General: Skin is warm and dry.     Capillary Refill: Capillary refill takes less than 2 seconds.  Neurological:     Mental Status: He is alert and oriented to person, place, and time. Mental status is at baseline.  Psychiatric:        Mood and Affect: Mood normal.        Behavior: Behavior normal.     ED Results and Treatments Labs (all labs ordered are listed, but only abnormal results are displayed) Labs Reviewed  BASIC METABOLIC PANEL - Abnormal; Notable for the following components:      Result Value   Sodium 131 (*)    Potassium 3.4 (*)    Chloride 93 (*)    Glucose, Bld 495 (*)    BUN 32 (*)    Creatinine, Ser 2.30 (*)    GFR, Estimated 32 (*)    All other components within normal limits  URINALYSIS, ROUTINE W REFLEX MICROSCOPIC - Abnormal; Notable for the following components:   Color, Urine COLORLESS (*)    Glucose, UA >1,000 (*)    All other components within normal limits  CBG MONITORING, ED - Abnormal; Notable for the following components:   Glucose-Capillary 508 (*)    All other components within normal limits  I-STAT VENOUS BLOOD GAS, ED - Abnormal; Notable for the following components:   pCO2, Ven 43.0 (*)    pO2, Ven 52 (*)    Acid-Base Excess 3.0 (*)    Sodium 131 (*)    All other components within normal limits  CBG MONITORING, ED - Abnormal; Notable for the following components:   Glucose-Capillary 425 (*)    All other components within normal limits  CBG MONITORING, ED -  Abnormal; Notable for the following components:   Glucose-Capillary 349 (*)    All other components within normal limits  CBC  BETA-HYDROXYBUTYRIC ACID  BRAIN NATRIURETIC PEPTIDE  CBG MONITORING, ED  Radiology DG Chest Portable 1 View  Result Date: 02/01/2022 CLINICAL DATA:  Shortness of breath.  Hyperglycemia. EXAM: PORTABLE CHEST 1 VIEW COMPARISON:  09/17/2016 and prior studies FINDINGS: Cardiomegaly and LEFT sided pacemaker/AICD again noted. This is a mildly low volume film with mild bibasilar opacities/atelectasis noted. There is no evidence of pulmonary edema, suspicious pulmonary nodule/mass, pleural effusion, or pneumothorax. No acute bony abnormalities are identified. IMPRESSION: Mildly low volume film with mild bibasilar opacities/atelectasis. Cardiomegaly. Electronically Signed   By: Margarette Canada M.D.   On: 02/01/2022 17:13    Pertinent labs & imaging results that were available during my care of the patient were reviewed by me and considered in my medical decision making (see MDM for details).  Medications Ordered in ED Medications  lactated ringers bolus 500 mL (0 mLs Intravenous Stopped 02/01/22 1937)  insulin aspart (novoLOG) injection 10 Units (10 Units Intravenous Given 02/01/22 1731)  potassium chloride SA (KLOR-CON M) CR tablet 60 mEq (60 mEq Oral Given 02/01/22 1753)                                                                                                                                     Procedures Procedures  (including critical care time)  Medical Decision Making / ED Course   MDM:  56 year old male presenting to the emergency department with hyperglycemia.  Overall well-appearing, physical exam notable only for dry mucous membranes, otherwise appears euvolemic to slightly dehydrated with no peripheral edema.  Suspect hyperglycemia  related to diabetes.  Patient overall reports compliance with medications although did not pick up prescribed insulin.  Given patient appeared dehydrated, gave small fluid bolus, insulin with improvement in symptoms.  Also repleted potassium which was low.  Labs otherwise notable for mild elevated creatinine suspicious for AKI in the setting of dehydration.  Doubt cardiorenal cause given dehydrated appearance.  No sign of DKA on lab testing.  Discussed importance of compliance with picking up insulin and initiating insulin at home and close follow-up with primary physician.      Additional history obtained: -External records from outside source obtained and reviewed including: Chart review including previous notes, labs, imaging, consultation notes   Lab Tests: -I ordered, reviewed, and interpreted labs.   The pertinent results include:   Labs Reviewed  BASIC METABOLIC PANEL - Abnormal; Notable for the following components:      Result Value   Sodium 131 (*)    Potassium 3.4 (*)    Chloride 93 (*)    Glucose, Bld 495 (*)    BUN 32 (*)    Creatinine, Ser 2.30 (*)    GFR, Estimated 32 (*)    All other components within normal limits  URINALYSIS, ROUTINE W REFLEX MICROSCOPIC - Abnormal; Notable for the following components:   Color, Urine COLORLESS (*)    Glucose, UA >1,000 (*)    All other components within normal limits  CBG MONITORING,  ED - Abnormal; Notable for the following components:   Glucose-Capillary 508 (*)    All other components within normal limits  I-STAT VENOUS BLOOD GAS, ED - Abnormal; Notable for the following components:   pCO2, Ven 43.0 (*)    pO2, Ven 52 (*)    Acid-Base Excess 3.0 (*)    Sodium 131 (*)    All other components within normal limits  CBG MONITORING, ED - Abnormal; Notable for the following components:   Glucose-Capillary 425 (*)    All other components within normal limits  CBG MONITORING, ED - Abnormal; Notable for the following components:    Glucose-Capillary 349 (*)    All other components within normal limits  CBC  BETA-HYDROXYBUTYRIC ACID  BRAIN NATRIURETIC PEPTIDE  CBG MONITORING, ED      EKG   EKG Interpretation  Date/Time:  Saturday February 01 2022 16:45:51 EDT Ventricular Rate:  76 PR Interval:  209 QRS Duration: 119 QT Interval:  388 QTC Calculation: 437 R Axis:   142 Text Interpretation: Sinus rhythm Borderline prolonged PR interval Nonspecific intraventricular conduction delay Probable anteroseptal infarct, old Confirmed by Garnette Gunner [phone]) on 02/01/2022 4:49:15 PM         Imaging Studies ordered: I ordered imaging studies including CXR I independently visualized and interpreted imaging. I agree with the radiologist interpretation   Medicines ordered and prescription drug management: Meds ordered this encounter  Medications   lactated ringers bolus 500 mL   DISCONTD: potassium chloride (KLOR-CON) packet 60 mEq   insulin aspart (novoLOG) injection 10 Units   potassium chloride SA (KLOR-CON M) CR tablet 60 mEq   Accu-Chek Softclix Lancets lancets    Sig: Use as instructed    Dispense:  100 each    Refill:  12   Blood Glucose Monitoring Suppl (ACCU-CHEK GUIDE) w/Device KIT    Sig: UAD    Dispense:  1 kit    Refill:  0   Continuous Blood Gluc Sensor (FREESTYLE LIBRE 2 SENSOR) MISC    Sig: Change device every 2 weeks    Dispense:  2 each    Refill:  6   Continuous Blood Gluc Receiver (FREESTYLE LIBRE 2 READER) DEVI    Sig: UAD    Dispense:  1 each    Refill:  0   glucose blood (ACCU-CHEK GUIDE) test strip    Sig: Use as instructed    Dispense:  100 each    Refill:  12   insulin aspart (NOVOLOG FLEXPEN) 100 UNIT/ML FlexPen    Sig: Inj 3-4 units with the two largest meals of the day subcut.    Dispense:  15 mL    Refill:  11   insulin glargine (LANTUS SOLOSTAR) 100 UNIT/ML Solostar Pen    Sig: Inject 10 Units into the skin daily.    Dispense:  15 mL    Refill:  PRN    -I have  reviewed the patients home medicines and have made adjustments as needed      Cardiac Monitoring: The patient was maintained on a cardiac monitor.  I personally viewed and interpreted the cardiac monitored which showed an underlying rhythm of: NSR  Social Determinants of Health:  Factors impacting patients care include: marijuana use   Reevaluation: After the interventions noted above, I reevaluated the patient and found that they have :improved  Co morbidities that complicate the patient evaluation  Past Medical History:  Diagnosis Date   AICD (automatic cardioverter/defibrillator) present    Chronic bronchitis (  [place])    "get it most q yr" (12/23/2013)   Chronic systolic (congestive) heart failure ([place])    Fracture of left humerus    a. 07/2013.   GERD (gastroesophageal reflux disease)    not at present time   Heart murmur    "born w/it"    History of renal calculi    Hypertension    Kidney stones    NICM (nonischemic cardiomyopathy) ([place])    a. 07/2013 Echo: EF 20-25%, diff HK, Gr2 DD, mild MR, mod dil LA/RA. 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  Despite best efforts to proofread,  errors can occur which can change the documentation meaning.    Cristie Hem, MD 02/01/22 2123

## 2022-02-01 NOTE — ED Triage Notes (Signed)
Patient here POV from Home.  Endorses SOB and Lightheadedness that began 2 Weeks ago. Assessed by PCP 2 Days ago and was informed his BG was >600. Some Polyuria, Polydipsia, Fatigue.    History of CHF and T2DM.  NAD Noted during Triage. A&Ox4. GCS 15. Ambulatory.

## 2022-02-02 ENCOUNTER — Encounter: Payer: Self-pay | Admitting: Internal Medicine

## 2022-02-03 ENCOUNTER — Other Ambulatory Visit: Payer: Self-pay

## 2022-02-03 ENCOUNTER — Encounter: Payer: Self-pay | Admitting: Internal Medicine

## 2022-02-03 ENCOUNTER — Telehealth: Payer: Self-pay | Admitting: Internal Medicine

## 2022-02-03 ENCOUNTER — Telehealth: Payer: Self-pay

## 2022-02-03 DIAGNOSIS — E1165 Type 2 diabetes mellitus with hyperglycemia: Secondary | ICD-10-CM

## 2022-02-03 MED ORDER — FREESTYLE LIBRE 2 READER DEVI: 0 refills | Status: DC

## 2022-02-03 MED ORDER — FREESTYLE LIBRE 3 SENSOR MISC: 12 refills | Status: DC

## 2022-02-03 MED ORDER — LANTUS SOLOSTAR 100 UNIT/ML ~~LOC~~ SOPN: 8.0000 [IU] | PEN_INJECTOR | Freq: Every day | SUBCUTANEOUS | 99 refills | Status: DC

## 2022-02-03 MED ORDER — TRULICITY 0.75 MG/0.5ML ~~LOC~~ SOAJ: 0.7500 mg | SUBCUTANEOUS | 2 refills | Status: DC

## 2022-02-03 NOTE — Telephone Encounter (Signed)
Transition Care Management Follow-up Telephone Call Date of discharge and from where: 02/01/2022 from [facility] How have you been since you were released from the hospital? Patient stated that he is still feeling tired. Patient stated that he is picking up the Lantus today. Sugar have been over 300 but has not started the Lantus. Patient stated that he has been feeling light headed and dizzy all day. Patient denies any n/v today.  Any questions or concerns? No  Items Reviewed: Did the pt receive and understand the discharge instructions provided? Yes  Medications obtained and verified? Yes  Other? No  Any new allergies since your discharge? No  Dietary orders reviewed? No Do you have support at home? Yes   Functional Questionnaire: (I = Independent and D = Dependent) ADLs: I Bathing/Dressing- I Meal Prep- I Eating- I Maintaining continence- I Transferring/Ambulation- I Managing Meds- I   Follow up appointments reviewed: PCP Hospital f/u appt confirmed? Yes  PCP wants to see patient this week and has a message to staff to get patient in.  [facility] f/u appt confirmed? No   Are transportation arrangements needed? No  If their condition worsens, is the pt aware to call PCP or go to the Emergency Dept.? Yes Was the patient provided with contact information for the PCP's office or ED? Yes Was to pt encouraged to call back with questions or concerns? Yes

## 2022-02-03 NOTE — Telephone Encounter (Signed)
Phone call placed to patient this morning in regards to his MyChart messages that he sent to me over the weekend.  I had seen him on 02/01/2022.  His blood sugar was registering high on her glucometer and A1c was greater than 15.  Patient started on Lantus insulin 10 units at bedtime and NovoLog for mealtime coverage.  I advised that he be seen in the emergency room.  Patient seen in the emergency room on 02/01/2022.  He was not in DKA.  He was given 10 units of aspart insulin and told to pick up his prescription for NovoLog and Lantus insulins.  ER physician resent his prescription to [facility] because [facility] was closed.  Could not get it at [facility].  Went to [facility] and was told that they could not fill it because the prescription was sent to [facility].  They did give him NovoLog.  Patient states that the NovoLog made him sick to his stomach.  Patient feels too overwhelmed at the thought of taking several shots of insulin a day.  He has a friend who is on Trulicity and his mom is on Ozempic.  Wonders if he can be tried with something like that.  Also states that his insurance does not cover the Dexcom.  Advised patient that we can try him with Trulicity which is a once weekly injection.  I went over with him how the medication works and possible side effects including nausea/vomiting.  Advised that the medication can sometimes cause pancreatitis which would present with pain in the upper abdomen and vomiting.  Advised if he experiences anything like that he should stop the medication and give [place] a call. -Advised that we do the Trulicity once a week and Lantus once a day at bedtime.  Patient is agreeable to this.  I told him that I will have them give him an appointment with our clinical pharmacist for insulin and Trulicity administration teaching.  STOP Januvia.  Advised that he check with his insurance to see if they cover for the [place] device.  Patient requested that all prescriptions be sent to  [facility] on [address].

## 2022-02-04 ENCOUNTER — Telehealth: Payer: Self-pay

## 2022-02-04 ENCOUNTER — Other Ambulatory Visit: Payer: Self-pay

## 2022-02-04 NOTE — Telephone Encounter (Signed)
Trulicity and [employer] products PA were approved, patient aware and pharmacy processed scripts. Patient still had concerns regarding his Lantus.  States that he doesn't know where original script is. [facility] says it is at [facility] and [facility] they don't have it.  He states he contacted his insurance and insurance contacted [place] but nothing has been resolved.  I advised patient that he may have to contact his insurance to see which pharmacy last billed a claim or have the pharmacy call to see where it was last filled so that the claim can be reversed and filled at his preferred pharmacy.  Gave patient my direct number if he still has trouble resolving and I can try calling on his behalf as well.

## 2022-02-04 NOTE — Telephone Encounter (Signed)
Appt made for this Friday

## 2022-02-04 NOTE — Telephone Encounter (Signed)
Trulicity PA has been approved until 02/04/2023.  PA for [employer] products have been submitted, waiting on insurance decision.

## 2022-02-04 NOTE — Telephone Encounter (Signed)
Created in error

## 2022-02-06 NOTE — Telephone Encounter (Signed)
Patient lvm for me to call back about the lantus, on Monday.   [facility] and they stated that patient picked up the Lantus on 02/04/2022.   LVM for patient informing of same and apologizing for delay.

## 2022-02-07 ENCOUNTER — Ambulatory Visit: Payer: Medicaid Other | Attending: Nurse Practitioner | Admitting: Pharmacist

## 2022-02-07 DIAGNOSIS — E1165 Type 2 diabetes mellitus with hyperglycemia: Secondary | ICD-10-CM | POA: Diagnosis not present

## 2022-02-07 NOTE — Progress Notes (Signed)
Patient was educated on the use of the Trulicity pen. Reviewed necessary supplies and operation of the pen. Also reviewed goal blood glucose levels. Patient was able to demonstrate use. All questions and concerns were addressed.  Time spent counseling: 15 minutes. Pt administered his first injection in clinic without incident.  Follow-up: PCP 03/04/2022  Benard Halsted, PharmD, [place], [place] [phone]

## 2022-02-11 ENCOUNTER — Telehealth: Payer: Self-pay

## 2022-02-11 ENCOUNTER — Ambulatory Visit (INDEPENDENT_AMBULATORY_CARE_PROVIDER_SITE_OTHER): Payer: Medicaid Other

## 2022-02-11 DIAGNOSIS — Z95810 Presence of automatic (implantable) cardiac defibrillator: Secondary | ICD-10-CM | POA: Diagnosis not present

## 2022-02-11 DIAGNOSIS — I5022 Chronic systolic (congestive) heart failure: Secondary | ICD-10-CM | POA: Diagnosis not present

## 2022-02-11 NOTE — Telephone Encounter (Signed)
Remote ICM transmission received.  Attempted call to patient regarding ICM remote transmission and left detailed message per DPR.  Advised to return call for any fluid symptoms or questions. Next ICM remote transmission scheduled 02/18/2022.    

## 2022-02-11 NOTE — Progress Notes (Signed)
Spoke with patient.  He reports ED visit for high blood sugar and dehydration on 8/26.  He is feeling better and taking diabetes medication since discharge but blood sugars remain elevated.  He currently does not have any fluid symptoms.  He has been drinking a lot of fluid since he has been home.  Advised to limit fluid intake to 64-76 oz and once blood sugars improve to limit fluid intake to 64 oz daily.  Encouraged to follow diabetic diet and limiting salt.  Will recheck fluid levels on 9/12 and advised to call if he experiences any fluid symptoms.

## 2022-02-11 NOTE — Progress Notes (Signed)
EPIC Encounter for ICM Monitoring  Patient Name: [name] is a 56 y.o. male Date: 02/11/2022 Primary Care Physican: [name], MD Primary Cardiologist: Stanford Breed Electrophysiologist: Quentin Ore 07/03/2021 Weight 258 lbs 09/11/2021 Weight: 262 lbs 10/21/2021 Weight: Not weighing at home 01/09/2022 Office Weight: 262 lbs                            Attempted call to patient and unable to reach.  Left detailed message per DPR regarding transmission. Transmission reviewed.   8/26 ED visit for hyperglycemia with A1C 15 and glucose >600.   Corvue thoracic impedance suggesting possible fluid accumulation starting 8/27 (correlates with ED visit) and trending back toward baseline.     Prescribed dosage:  Furosemide 80 mg take 1 tablet (80 mg total) twice a day.    Potassium 10 mEq take 1 tablet daily.    Labs: 02/01/2022 Creatinine 2.30, BUN 32, Potassium 3.4, Sodium 131, GFR 32 01/30/2022 A1C   15.0 01/09/2022 Creatinine 1.91, BUN 19, Potassium 4.1, Sodium 135 08/26/2021 Creatinine 2.08, BUN 20, Potassium 3.9, Sodium 137, GFR 37, A1C 13% Complete set of lab results in result review.    Recommendations:  Left voice mail with ICM number and encouraged to call if experiencing any fluid symptoms.   Follow-up plan: ICM clinic phone appointment on 02/18/2022 to recheck fluid levels.   91 day device clinic remote transmission 04/04/2022.     EP/Cardiology Office Visits: Recall 07/08/2022 with [name].  Recall 09/15/2021 with Oda Kilts, PA   Copy of ICM check sent to Dr Quentin Ore (replacing Dr. Rayann Heman).  Will send copy to Dr Stanford Breed for review if patient is reached.   3 month ICM trend: 02/11/2022.    12-14 Month ICM trend:     [name], RN 02/11/2022 2:04 PM

## 2022-02-13 ENCOUNTER — Emergency Department (HOSPITAL_COMMUNITY)
Admission: EM | Admit: 2022-02-13 | Discharge: 2022-02-13 | Disposition: A | Discharge status: Home / Self Care | Payer: Medicaid Other | Attending: Emergency Medicine | Admitting: Emergency Medicine

## 2022-02-13 ENCOUNTER — Emergency Department (HOSPITAL_COMMUNITY): Payer: Medicaid Other

## 2022-02-13 ENCOUNTER — Encounter: Payer: Self-pay | Admitting: Internal Medicine

## 2022-02-13 ENCOUNTER — Encounter (HOSPITAL_COMMUNITY): Payer: Self-pay

## 2022-02-13 ENCOUNTER — Ambulatory Visit: Payer: Self-pay | Admitting: *Deleted

## 2022-02-13 ENCOUNTER — Other Ambulatory Visit: Payer: Self-pay

## 2022-02-13 DIAGNOSIS — E1165 Type 2 diabetes mellitus with hyperglycemia: Secondary | ICD-10-CM | POA: Insufficient documentation

## 2022-02-13 DIAGNOSIS — Z794 Long term (current) use of insulin: Secondary | ICD-10-CM | POA: Insufficient documentation

## 2022-02-13 DIAGNOSIS — R059 Cough, unspecified: Secondary | ICD-10-CM | POA: Diagnosis not present

## 2022-02-13 DIAGNOSIS — I10 Essential (primary) hypertension: Secondary | ICD-10-CM | POA: Insufficient documentation

## 2022-02-13 DIAGNOSIS — R944 Abnormal results of kidney function studies: Secondary | ICD-10-CM | POA: Diagnosis not present

## 2022-02-13 DIAGNOSIS — U071 COVID-19: Secondary | ICD-10-CM | POA: Diagnosis not present

## 2022-02-13 DIAGNOSIS — Z7982 Long term (current) use of aspirin: Secondary | ICD-10-CM | POA: Diagnosis not present

## 2022-02-13 DIAGNOSIS — R739 Hyperglycemia, unspecified: Secondary | ICD-10-CM

## 2022-02-13 DIAGNOSIS — Z79899 Other long term (current) drug therapy: Secondary | ICD-10-CM | POA: Insufficient documentation

## 2022-02-13 LAB — SARS CORONAVIRUS 2 BY RT PCR: SARS Coronavirus 2 by RT PCR: POSITIVE — AB

## 2022-02-13 LAB — BASIC METABOLIC PANEL
Anion gap: 10 (ref 5–15)
BUN: 26 mg/dL — ABNORMAL HIGH (ref 6–20)
CO2: 26 mmol/L (ref 22–32)
Calcium: 9.2 mg/dL (ref 8.9–10.3)
Chloride: 104 mmol/L (ref 98–111)
Creatinine, Ser: 2.14 mg/dL — ABNORMAL HIGH (ref 0.61–1.24)
GFR, Estimated: 35 mL/min — ABNORMAL LOW (ref >60)
Glucose, Bld: 300 mg/dL — ABNORMAL HIGH (ref 70–99)
Potassium: 3.7 mmol/L (ref 3.5–5.1)
Sodium: 140 mmol/L (ref 135–145)

## 2022-02-13 LAB — CBC
HCT: 42 % (ref 39.0–52.0)
Hemoglobin: 13.9 g/dL (ref 13.0–17.0)
MCH: 30.8 pg (ref 26.0–34.0)
MCHC: 33.1 g/dL (ref 30.0–36.0)
MCV: 92.9 fL (ref 80.0–100.0)
Platelets: 283 10*3/uL (ref 150–400)
RBC: 4.52 MIL/uL (ref 4.22–5.81)
RDW: 13.7 % (ref 11.5–15.5)
WBC: 6.4 10*3/uL (ref 4.0–10.5)
nRBC: 0 % (ref 0.0–0.2)

## 2022-02-13 LAB — URINALYSIS, ROUTINE W REFLEX MICROSCOPIC
Bacteria, UA: NONE SEEN
Bilirubin Urine: NEGATIVE
Glucose, UA: >=500 mg/dL — AB
Hgb urine dipstick: NEGATIVE
Ketones, ur: NEGATIVE mg/dL
Leukocytes,Ua: NEGATIVE
Nitrite: NEGATIVE
Protein, ur: NEGATIVE mg/dL
Specific Gravity, Urine: 1.015 (ref 1.005–1.030)
pH: 5 (ref 5.0–8.0)

## 2022-02-13 LAB — CBG MONITORING, ED
Glucose-Capillary: 293 mg/dL — ABNORMAL HIGH (ref 70–99)
Glucose-Capillary: 190 mg/dL — ABNORMAL HIGH (ref 70–99)

## 2022-02-13 MED ORDER — MOLNUPIRAVIR EUA 200MG CAPSULE: 4.0000 | ORAL_CAPSULE | Freq: Two times a day (BID) | ORAL | 0 refills | Status: AC

## 2022-02-13 MED ORDER — BENZONATATE 100 MG PO CAPS: 100.0000 mg | ORAL_CAPSULE | Freq: Two times a day (BID) | ORAL | 0 refills | Status: DC | PRN

## 2022-02-13 MED ORDER — BENZONATATE 100 MG PO CAPS
100.0000 mg | ORAL_CAPSULE | Freq: Once | ORAL | Status: AC
Start: 2022-02-13 — End: 2022-02-13
  Administered 2022-02-13: 100 mg via ORAL
  Filled 2022-02-13: qty 1

## 2022-02-13 MED ORDER — ALBUTEROL SULFATE HFA 108 (90 BASE) MCG/ACT IN AERS
2.0000 | INHALATION_SPRAY | Freq: Once | RESPIRATORY_TRACT | Status: AC
  Administered 2022-02-13: 2 via RESPIRATORY_TRACT
  Filled 2022-02-13: qty 6.7

## 2022-02-13 MED ORDER — ACETAMINOPHEN 325 MG PO TABS
650.0000 mg | ORAL_TABLET | Freq: Once | ORAL | Status: AC
  Administered 2022-02-13: 650 mg via ORAL
  Filled 2022-02-13: qty 2

## 2022-02-13 MED ORDER — SODIUM CHLORIDE 0.9 % IV BOLUS
1000.0000 mL | Freq: Once | INTRAVENOUS | Status: AC
  Administered 2022-02-13: 1000 mL via INTRAVENOUS

## 2022-02-13 NOTE — Telephone Encounter (Signed)
Summary: clinical advice/not feeling well.   Pt called and stated that he is not feeling well. Cough/sore/headache/no appetite/yellow mucus.       Chief Complaint: Cough, headache Symptoms: Cough, productive, subjective fever,severe chills,sore throat, body aches,headache, weakness"Can hardly get out of bed." States has not eaten in 3 days, has not taken insulin. Drinking little, SOB "Usual for me." Frequency: Tuesday Pertinent Negatives: Patient denies  Disposition: [x] ED /[] Urgent Care (no appt availability in office) / [] Appointment(In office/virtual)/ []  [facility]/ [] Home Care/ [] Refused Recommended Disposition /[] [facility]/ []  Follow-up with PCP Additional Notes: Pt with direct covid exposure visiting father in nursing home, father tested positive. Did home test, "Not sure I did it right and too sick to do another." Pt sounds distressed, high risk pt. Advised ED. States will follow disposition. Reason for Disposition  Patient sounds very sick or weak to the triager  Answer Assessment - Initial Assessment Questions 1. COVID-19 EXPOSURE: "Please describe how you were exposed to someone with a COVID-19 infection."     Yes, in nursing home visiting dad 2. PLACE of CONTACT: "Where were you when you were exposed to COVID-19?" (e.g., home, school, medical waiting room; which city?)     Nursing home 3. TYPE of CONTACT: "How much contact was there?" (e.g., sitting next to, live in same house, work in same office, same building)     Direct 4. DURATION of CONTACT: "How long were you in contact with the COVID-19 patient?" (e.g., a few seconds, passed by person, a few minutes, 15 minutes or longer, live with the patient)     15 longer 5. MASK: "Were you wearing a mask?" "Was the other person wearing a mask?" Note: wearing a mask reduces the risk of an otherwise close contact.     no 6. DATE of CONTACT: "When did you have contact with a COVID-19 patient?" (e.g., how many  days ago)     4 daysago 7. COMMUNITY SPREAD: "Do you live in or have you traveled to an area where there are lots of COVID-19 cases (community spread)?" (See public health department website, if unsure)       *No Answer* 8. SYMPTOMS: "Do you have any symptoms?" (e.g., fever, cough, breathing difficulty, loss of taste or smell)      9. VACCINE: "Have you gotten the COVID-19 vaccine?" If Yes, ask: "Which one, how many shots, when did you get it?"     One vaccine  11. HIGH RISK: "Do you have any heart or lung problems?" (e.g., asthma, COPD, heart failure) "Do you have a weak immune system or other risk factors?" (e.g., HIV positive, chemotherapy, renal failure, diabetes mellitus, sickle cell anemia, obesity)       Yes  Answer Assessment - Initial Assessment Questions 1. ONSET: "When did the cough begin?"      Tuesday 2. SEVERITY: "How bad is the cough today?"      Moderate, severe at night 3. SPUTUM: "Describe the color of your sputum" (none, dry cough; clear, white, yellow, green)     Yellowish 4. HEMOPTYSIS: "Are you coughing up any blood?" If so ask: "How much?" (flecks, streaks, tablespoons, etc.)     No 5. DIFFICULTY BREATHING: "Are you having difficulty breathing?" If Yes, ask: "How bad is it?" (e.g., mild, moderate, severe)    - MILD: No SOB at rest, mild SOB with walking, speaks normally in sentences, can lie down, no retractions, pulse < 100.    - MODERATE: SOB at rest,  SOB with minimal exertion and prefers to sit, cannot lie down flat, speaks in phrases, mild retractions, audible wheezing, pulse 100-120.    - SEVERE: Very SOB at rest, speaks in single words, struggling to breathe, sitting hunched forward, retractions, pulse > 120      SOB as usual 6. FEVER: "Do you have a fever?" If Yes, ask: "What is your temperature, how was it measured, and when did it start?"     Chills and sweating 7. CARDIAC HISTORY: "Do you have any history of heart disease?" (e.g., heart attack, congestive  heart failure)      yes 8. LUNG HISTORY: "Do you have any history of lung disease?"  (e.g., pulmonary embolus, asthma, emphysema)     *No Answer* 9. PE RISK FACTORS: "Do you have a history of blood clots?" (or: recent major surgery, recent prolonged travel, bedridden)     *No Answer* 10. OTHER SYMPTOMS: "Do you have any other symptoms?" (e.g., runny nose, wheezing, chest pain)       Sore throat, headache, body aches,subjective fever  Answer Assessment - Initial Assessment Questions 1. COVID-19 DIAGNOSIS: "How do you know that you have COVID?" (e.g., positive lab test or self-test, diagnosed by doctor or NP/PA, symptoms after exposure).     *No Answer* 2. COVID-19 EXPOSURE: "Was there any known exposure to COVID before the symptoms began?" CDC Definition of close contact: within 6 feet (2 meters) for a total of 15 minutes or more over a 24-hour period.      *No Answer* 3. ONSET: "When did the COVID-19 symptoms start?"      *No Answer* 4. WORST SYMPTOM: "What is your worst symptom?" (e.g., cough, fever, shortness of breath, muscle aches)     *No Answer* 5. COUGH: "Do you have a cough?" If Yes, ask: "How bad is the cough?"       *No Answer* 6. FEVER: "Do you have a fever?" If Yes, ask: "What is your temperature, how was it measured, and when did it start?"     *No Answer* 7. RESPIRATORY STATUS: "Describe your breathing?" (e.g., normal; shortness of breath, wheezing, unable to speak)      *No Answer* 8. BETTER-SAME-WORSE: "Are you getting better, staying the same or getting worse compared to yesterday?"  If getting worse, ask, "In what way?"     *No Answer* 9. OTHER SYMPTOMS: "Do you have any other symptoms?"  (e.g., chills, fatigue, headache, loss of smell or taste, muscle pain, sore throat)     *No Answer* 10. HIGH RISK DISEASE: "Do you have any chronic medical problems?" (e.g., asthma, heart or lung disease, weak immune system, obesity, etc.)       Yes 11. VACCINE: "Have you had the  COVID-19 vaccine?" If Yes, ask: "Which one, how many shots, when did you get it?"       *No Answer* 12. PREGNANCY: "Is there any chance you are pregnant?" "When was your last menstrual period?"       *No Answer* 13. O2 SATURATION MONITOR:  "Do you use an oxygen saturation monitor (pulse oximeter) at home?" If Yes, ask "What is your reading (oxygen level) today?" "What is your usual oxygen saturation reading?" (e.g., 95%)       *No Answer*  Protocols used: Cough - Acute Productive-A-AH, Coronavirus (COVID-19) Diagnosed or Suspected-A-AH, Coronavirus (COVID-19) Exposure-A-AH

## 2022-02-13 NOTE — Discharge Instructions (Addendum)
You have COVID and are expected to not feel well.  You are expected to run fevers and chills and cough.  I have prescribed molnupiravir to help you with your symptoms.  I have also prescribed Tessalon Perles to help you with cough  You need to isolate at least for 5 days and then wear masks around others for another 5 days  See your doctor for follow-up   Return to ER if you have worse cough, trouble breathing, dehydration, vomiting, glucose greater than 500     Person Under Monitoring Name: [name]  Location: [place] [address]   Infection Prevention Recommendations for Individuals Confirmed to have, or Being Evaluated for, 2019 Novel Coronavirus (COVID-19) Infection Who Receive Care at Home  Individuals who are confirmed to have, or are being evaluated for, COVID-19 should follow the prevention steps below until a healthcare provider or local or state health department says they can return to normal activities.  Stay home except to get medical care You should restrict activities outside your home, except for getting medical care. Do not go to work, school, or public areas, and do not use public transportation or taxis.  Call ahead before visiting your doctor Before your medical appointment, call the healthcare provider and tell them that you have, or are being evaluated for, COVID-19 infection. This will help the healthcare provider's office take steps to keep other people from getting infected. Ask your healthcare provider to call the local or state health department.  Monitor your symptoms Seek prompt medical attention if your illness is worsening (e.g., difficulty breathing). Before going to your medical appointment, call the healthcare provider and tell them that you have, or are being evaluated for, COVID-19 infection. Ask your healthcare provider to call the local or state health department.  Wear a facemask You should wear a facemask that  covers your nose and mouth when you are in the same room with other people and when you visit a healthcare provider. People who live with or visit you should also wear a facemask while they are in the same room with you.  Separate yourself from other people in your home As much as possible, you should stay in a different room from other people in your home. Also, you should use a separate bathroom, if available.  Avoid sharing household items You should not share dishes, drinking glasses, cups, eating utensils, towels, bedding, or other items with other people in your home. After using these items, you should wash them thoroughly with soap and water.  Cover your coughs and sneezes Cover your mouth and nose with a tissue when you cough or sneeze, or you can cough or sneeze into your sleeve. Throw used tissues in a lined trash can, and immediately wash your hands with soap and water for at least 20 seconds or use an alcohol-based hand rub.  Wash your [facility] your hands often and thoroughly with soap and water for at least 20 seconds. You can use an alcohol-based hand sanitizer if soap and water are not available and if your hands are not visibly dirty. Avoid touching your eyes, nose, and mouth with unwashed hands.   Prevention Steps for Caregivers and Household Members of Individuals Confirmed to have, or Being Evaluated for, COVID-19 Infection Being Cared for in the Home  If you live with, or provide care at home for, a person confirmed to have, or being evaluated for, COVID-19 infection please follow these guidelines to prevent  infection:  Follow healthcare provider's instructions Make sure that you understand and can help the patient follow any healthcare provider instructions for all care.  Provide for the patient's basic needs You should help the patient with basic needs in the home and provide support for getting groceries, prescriptions, and other personal needs.  Monitor  the patient's symptoms If they are getting sicker, call his or her medical provider and tell them that the patient has, or is being evaluated for, COVID-19 infection. This will help the healthcare provider's office take steps to keep other people from getting infected. Ask the healthcare provider to call the local or state health department.  Limit the number of people who have contact with the patient If possible, have only one caregiver for the patient. Other household members should stay in another home or place of residence. If this is not possible, they should stay in another room, or be separated from the patient as much as possible. Use a separate bathroom, if available. Restrict visitors who do not have an essential need to be in the home.  Keep older adults, very young children, and other sick people away from the patient Keep older adults, very young children, and those who have compromised immune systems or chronic health conditions away from the patient. This includes people with chronic heart, lung, or kidney conditions, diabetes, and cancer.  Ensure good ventilation Make sure that shared spaces in the home have good air flow, such as from an air conditioner or an opened window, weather permitting.  Wash your hands often Wash your hands often and thoroughly with soap and water for at least 20 seconds. You can use an alcohol based hand sanitizer if soap and water are not available and if your hands are not visibly dirty. Avoid touching your eyes, nose, and mouth with unwashed hands. Use disposable paper towels to dry your hands. If not available, use dedicated cloth towels and replace them when they become wet.  Wear a facemask and gloves Wear a disposable facemask at all times in the room and gloves when you touch or have contact with the patient's blood, body fluids, and/or secretions or excretions, such as sweat, saliva, sputum, nasal mucus, vomit, urine, or feces.  Ensure the  mask fits over your nose and mouth tightly, and do not touch it during use. Throw out disposable facemasks and gloves after using them. Do not reuse. Wash your hands immediately after removing your facemask and gloves. If your personal clothing becomes contaminated, carefully remove clothing and launder. Wash your hands after handling contaminated clothing. Place all used disposable facemasks, gloves, and other waste in a lined container before disposing them with other household waste. Remove gloves and wash your hands immediately after handling these items.  Do not share dishes, glasses, or other household items with the patient Avoid sharing household items. You should not share dishes, drinking glasses, cups, eating utensils, towels, bedding, or other items with a patient who is confirmed to have, or being evaluated for, COVID-19 infection. After the person uses these items, you should wash them thoroughly with soap and water.  Wash laundry thoroughly Immediately remove and wash clothes or bedding that have blood, body fluids, and/or secretions or excretions, such as sweat, saliva, sputum, nasal mucus, vomit, urine, or feces, on them. Wear gloves when handling laundry from the patient. Read and follow directions on labels of laundry or clothing items and detergent. In general, wash and dry with the warmest temperatures recommended on the  label.  Clean all areas the individual has used often Clean all touchable surfaces, such as counters, tabletops, doorknobs, bathroom fixtures, toilets, phones, keyboards, tablets, and bedside tables, every day. Also, clean any surfaces that may have blood, body fluids, and/or secretions or excretions on them. Wear gloves when cleaning surfaces the patient has come in contact with. Use a diluted bleach solution (e.g., dilute bleach with 1 part bleach and 10 parts water) or a household disinfectant with a label that says EPA-registered for coronaviruses. To make  a bleach solution at home, add 1 tablespoon of bleach to 1 quart (4 cups) of water. For a larger supply, add  cup of bleach to 1 gallon (16 cups) of water. Read labels of cleaning products and follow recommendations provided on product labels. Labels contain instructions for safe and effective use of the cleaning product including precautions you should take when applying the product, such as wearing gloves or eye protection and making sure you have good ventilation during use of the product. Remove gloves and wash hands immediately after cleaning.  Monitor yourself for signs and symptoms of illness Caregivers and household members are considered close contacts, should monitor their health, and will be asked to limit movement outside of the home to the extent possible. Follow the monitoring steps for close contacts listed on the symptom monitoring form.   ? If you have additional questions, contact your local health department or call the epidemiologist on call at [phone] (available 24/7). ? This guidance is subject to change. For the most up-to-date guidance from [facility], please refer to their website: YouBlogs.pl

## 2022-02-13 NOTE — ED Triage Notes (Signed)
Patient c/o chills, a productive cough with yellow sputum, generalized body aches, sore throat, headache x 2 days.  Patient states he was exposed to his father who tested Covid + in an ECF.  Patient also c/o hyperglycemia and states his CBG's have been in the 300's for the past 2 weeks and states it was greater than 600 2 weeks ago.

## 2022-02-13 NOTE — ED Provider Notes (Signed)
[facility] DEPT Provider Note   CSN: [id number] Arrival date & time: 02/13/22  1530     History  Chief Complaint  Patient presents with   Hyperglycemia   Chills   Cough   Generalized Body Aches    [name] is a 56 y.o. male history of diabetes, hypertension, here presenting with generalized body aches and cough and chills and hyperglycemia.  Patient went to visit his father in the nursing home 4 days ago.  He did not realize that his father had COVID and was notified later.  He states that since yesterday he has been feeling bad.  He states that he has generalized body aches and has nonproductive cough.  He also noticed that his blood sugar has been elevated around 300.  Patient states that he had COVID previously but did not require hospitalization.  The history is provided by the patient.       Home Medications Prior to Admission medications   Medication Sig Start Date End Date Taking? Authorizing Provider  Accu-Chek Softclix Lancets lancets Use as instructed 02/01/22   Cristie Hem, MD  aspirin 81 MG tablet Take 81 mg by mouth daily.    [provider]  atorvastatin (LIPITOR) 10 MG tablet Take 1 tablet (10 mg total) by mouth daily. 01/19/22   Elsie Stain, MD  benzonatate (TESSALON) 100 MG capsule Take 1 capsule (100 mg total) by mouth 2 (two) times daily as needed for cough. 07/10/21   [name], MD  Blood Glucose Monitoring Suppl (ACCU-CHEK GUIDE) w/Device KIT UAD 02/01/22   Cristie Hem, MD  carvedilol (COREG) 6.25 MG tablet Take 1 tablet (6.25 mg total) by mouth 2 (two) times daily. 09/19/21   Lelon Perla, MD  cetirizine (ZYRTEC) 10 MG tablet Take 1 tablet (10 mg total) by mouth daily. 01/30/22   Valentina Shaggy, MD  clobetasol ointment (TEMOVATE) 8.25 % Apply 1 Application topically 2 (two) times daily. 01/30/22   Valentina Shaggy, MD  Continuous Blood Gluc Receiver (FREESTYLE LIBRE 2 READER)  DEVI Use as directed. 02/03/22   [name], MD  Continuous Blood Gluc Sensor (FREESTYLE LIBRE 3 SENSOR) MISC Change sensor every 2 weeks. 02/03/22   [name], MD  dapagliflozin propanediol (FARXIGA) 5 MG TABS tablet Take 1 tablet (5 mg total) by mouth daily before breakfast. 01/19/22   Elsie Stain, MD  Dulaglutide (TRULICITY) 0.03 BC/4.8GQ SOPN Inject 0.75 mg into the skin once a week. 02/03/22   [name], MD  EPINEPHrine 0.3 mg/0.3 mL IJ SOAJ injection Inject 0.3 mg into the muscle as needed. 01/30/22   Valentina Shaggy, MD  famotidine (PEPCID) 20 MG tablet Take 1 tablet by mouth once daily 01/01/22   Valentina Shaggy, MD  fluticasone [facility]) 50 MCG/ACT nasal spray Use 1-2 sprays in each nostril once a day as needed for nasal congestion. 01/30/22   Valentina Shaggy, MD  furosemide (LASIX) 80 MG tablet Take 1 tablet (80 mg total) by mouth 2 (two) times daily. 04/12/21   Lelon Perla, MD  gabapentin (NEURONTIN) 300 MG capsule TAKE 1 CAPSULE BY MOUTH EVERY DAY AT BEDTIME 10/05/20   [name], MD  gabapentin (NEURONTIN) 300 MG capsule Take 1 capsule by mouth at bedtime 10/17/21   [name], MD  glucose blood (ACCU-CHEK GUIDE) test strip Use as instructed 02/01/22   Cristie Hem, MD  insulin glargine (LANTUS SOLOSTAR) 100  UNIT/ML Solostar Pen Inject 8 Units into the skin daily. 02/03/22   [name], MD  Lancet Devices [facility]) lancets 1 each by Other route 3 (three) times daily. 10/20/14   Boykin Nearing, MD  levalbuterol [facility] HFA) 45 MCG/ACT inhaler Inhale 2 puffs into the lungs every 6 (six) hours as needed for wheezing or shortness of breath. 01/30/22   Valentina Shaggy, MD  methocarbamol (ROBAXIN) 500 MG tablet TAKE 1 TABLET BY MOUTH ONCE DAILY AS NEEDED FOR MUSCLE SPASM 12/19/21   [name], MD  mometasone (ELOCON) 0.1 % cream Apply 1 application topically daily. 06/17/19   Marzetta Board,  DPM  potassium chloride (KLOR-CON) 10 MEQ tablet Take 10 mEq by mouth daily. 11/02/20   [provider]  sacubitril-valsartan (ENTRESTO) 24-26 MG Take 1 tablet by mouth 2 (two) times daily. 04/12/21   Lelon Perla, MD  sildenafil (VIAGRA) 100 MG tablet Take 1-2 tabs PO 1/2-1 hr prior to intercourse. 12/14/20   [name], MD  tamsulosin (FLOMAX) 0.4 MG CAPS capsule Take 2 capsules (0.8 mg total) by mouth daily. 01/19/22   Elsie Stain, MD  triamcinolone cream (KENALOG) 0.1 % Apply sparingly to red itchy areas twice daily as needed. Avoid face, neck, armpits or groin area. Do not use more than 3 weeks in a row. 01/30/22   Valentina Shaggy, MD      Allergies    Bee venom    Review of Systems   Review of Systems  Respiratory:  Positive for cough.   All other systems reviewed and are negative.   Physical Exam Updated Vital Signs BP (!) 145/89   Pulse 82   Temp 98.6 F (37 C) (Oral)   Resp 20   Ht 6' (1.829 m)   Wt 111.6 kg   SpO2 96%   BMI 33.36 kg/m  Physical Exam Vitals and nursing note reviewed.  Constitutional:      Comments: Uncomfortable  HENT:     Head: Normocephalic.     Nose: Nose normal.     Mouth/Throat:     Mouth: Mucous membranes are dry.  Eyes:     Extraocular Movements: Extraocular movements intact.     Pupils: Pupils are equal, round, and reactive to light.  Cardiovascular:     Rate and Rhythm: Normal rate and regular rhythm.     Pulses: Normal pulses.     Heart sounds: Normal heart sounds.  Pulmonary:     Comments: Tachypneic, mild diffuse wheezing but no retraction Abdominal:     General: Abdomen is flat.     Palpations: Abdomen is soft.  Musculoskeletal:        General: Normal range of motion.     Cervical back: Normal range of motion and neck supple.  Skin:    General: Skin is warm.     Capillary Refill: Capillary refill takes less than 2 seconds.  Neurological:     General: No focal deficit present.     Mental  Status: He is alert and oriented to person, place, and time.  Psychiatric:        Mood and Affect: Mood normal.        Behavior: Behavior normal.     ED Results / Procedures / Treatments   Labs (all labs ordered are listed, but only abnormal results are displayed) Labs Reviewed  SARS CORONAVIRUS 2 BY RT PCR - Abnormal; Notable for the following components:      Result  Value   SARS Coronavirus 2 by RT PCR POSITIVE (*)    All other components within normal limits  BASIC METABOLIC PANEL - Abnormal; Notable for the following components:   Glucose, Bld 300 (*)    BUN 26 (*)    Creatinine, Ser 2.14 (*)    GFR, Estimated 35 (*)    All other components within normal limits  URINALYSIS, ROUTINE W REFLEX MICROSCOPIC - Abnormal; Notable for the following components:   Color, Urine STRAW (*)    Glucose, UA >=500 (*)    All other components within normal limits  CBG MONITORING, ED - Abnormal; Notable for the following components:   Glucose-Capillary 293 (*)    All other components within normal limits  CBG MONITORING, ED - Abnormal; Notable for the following components:   Glucose-Capillary 190 (*)    All other components within normal limits  CBC    EKG None  Radiology DG Chest Port 1 View  Result Date: 02/13/2022 CLINICAL DATA:  Productive cough EXAM: PORTABLE CHEST 1 VIEW COMPARISON:  02/01/2022, CT 05/08/2004 FINDINGS: Left-sided pacing device as before. Mild cardiomegaly. No consolidation, pleural effusion, or pneumothorax. IMPRESSION: No active disease.  Borderline to mild cardiomegaly. Electronically Signed   By: Donavan Foil M.D.   On: 02/13/2022 20:40    Procedures Procedures    Medications Ordered in ED Medications  sodium chloride 0.9 % bolus 1,000 mL (1,000 mLs Intravenous New Bag/Given 02/13/22 2049)  albuterol (VENTOLIN HFA) 108 (90 Base) MCG/ACT inhaler 2 puff (2 puffs Inhalation Given 02/13/22 2053)  acetaminophen (TYLENOL) tablet 650 mg (650 mg Oral Given 02/13/22 2052)     ED Course/ Medical Decision Making/ A&P                           Medical Decision Making [name] is a 56 y.o. male history of diabetes here presenting with possible COVID exposure with cough and congestion and chills.  Patient went to visit his father who has COVID and now has COVID-like symptoms.  Plan to get CBC and CMP and chest x-ray and COVID test  10:12 PM I reviewed patient's labs and independently interpreted imaging studies.  Initial glucose was 290 and went down to 190 after IV fluids.  Patient's creatinine is 2.1 which is baseline.  Patient's COVID test is positive.  Patient's chest x-ray is clear.  Since patient is on multiple medicines that would interact with Paxlovid, I prescribed molnupavir instead. He requests cough medicine as well. Gave strict return precautions    Problems Addressed: COVID-19: acute illness or injury Hyperglycemia: acute illness or injury  Amount and/or Complexity of Data Reviewed Labs: ordered. Decision-making details documented in ED Course. Radiology: ordered and independent interpretation performed. Decision-making details documented in ED Course.  Risk OTC drugs. Prescription drug management.    Final Clinical Impression(s) / ED Diagnoses Final diagnoses:  None    Rx / DC Orders ED Discharge Orders     None         Drenda Freeze, MD 02/13/22 2214

## 2022-02-13 NOTE — Telephone Encounter (Signed)
FYI

## 2022-02-13 NOTE — ED Provider Triage Note (Signed)
Emergency Medicine Provider Triage Evaluation Note  [name] , a 56 y.o. male  was evaluated in triage.  Pt complains of feeling bad.  Pt reports he was exposed to covid and blood sugar has been high  Review of Systems  Positive: Fever and cough Negative: abdominal pain   Physical Exam  BP 116/76   Pulse 81   Temp 98.1 F (36.7 C) (Oral)   Resp 17   Ht 6' (1.829 m)   Wt 111.6 kg   SpO2 97%   BMI 33.36 kg/m  Gen:   Awake, no distress   Resp:  Normal effort  MSK:   Moves extremities without difficulty  Other:   Medical Decision Making  Medically screening exam initiated at 4:29 PM.  Appropriate orders placed.  [name] was informed that the remainder of the evaluation will be completed by another provider, this initial triage assessment does not replace that evaluation, and the importance of remaining in the ED until their evaluation is complete.     Fransico Meadow, [place] 02/13/22 1630

## 2022-02-13 NOTE — Telephone Encounter (Signed)
Will forward to pcp

## 2022-02-14 NOTE — Telephone Encounter (Signed)
Thanks. Noted. (Sent to pcp as FYI)----DD,RMA

## 2022-02-17 ENCOUNTER — Ambulatory Visit: Payer: Self-pay

## 2022-02-17 ENCOUNTER — Encounter: Payer: Self-pay | Admitting: Internal Medicine

## 2022-02-17 MED ORDER — GUAIFENESIN-CODEINE 100-10 MG/5ML PO SOLN: 5.0000 mL | Freq: Three times a day (TID) | ORAL | 0 refills | Status: DC | PRN

## 2022-02-17 NOTE — Addendum Note (Signed)
Addended by: Karle Plumber B on: 02/17/2022 06:34 PM   Modules accepted: Orders

## 2022-02-17 NOTE — Telephone Encounter (Signed)
  Chief Complaint: COVID fu Symptoms: cough, congestion, chills, sweating, HA Frequency: ongoing since 02/13/22 Pertinent Negatives: NA Disposition: [] ED /[] Urgent Care (no appt availability in office) / [x] Appointment(In office/virtual)/ []  [facility]/ [] Home Care/ [] Refused Recommended Disposition /[] [facility]/ []  Follow-up with PCP Additional Notes: pt is asking if anything else can be sent in for cough and congestion d/t being on day 4/5 of anti-viral and not any better. Pt has taken all of the tessalon perls and asking about codeine syrup for cough. I advised pt he needs to schedule fu appt from ED on 02/13/22. Pt said he was unable to come in office but agreed to schedule VV for tomorrow at 1600 with Dr. Joya Gaskins. Pt is asking if something can be sent today rather than waiting until tomorrow. Advised him I would send message back for review.   Reason for Disposition  [1] Caller has URGENT question AND [2] triager unable to answer question  Answer Assessment - Initial Assessment Questions 1. COVID-19 ONSET: "When did the symptoms of COVID-19 first start?"     02/13/22 2. DIAGNOSIS CONFIRMATION: "How were you diagnosed?" (e.g., COVID-19 oral or nasal viral test; COVID-19 antibody test; doctor visit)     ED  3. MAIN SYMPTOM:  "What is your main concern or symptom right now?" (e.g., breathing difficulty, cough, fatigue. loss of smell)     Congestion, cough  4. SYMPTOM ONSET: "When did the  sx  start?"     Last Monday or Tuesday  6. RECENT MEDICAL VISIT: "Have you been seen by a healthcare provider (doctor, NP, PA) for these persisting COVID-19 symptoms?" If Yes, ask: "When were you seen?" (e.g., date)     Went to ED on 02/13/22 7. COUGH: "Do you have a cough?" If Yes, ask: "How bad is the cough?"       Yes, mild to moderate 9. BREATHING DIFFICULTY: "Are you having any trouble breathing?" If Yes, ask: "How bad is your breathing?" (e.g., mild, moderate, severe)    - MILD: No  SOB at rest, mild SOB with walking, speaks normally in sentences, can lie down, no retractions, pulse < 100.    - MODERATE: SOB at rest, SOB with minimal exertion and prefers to sit, cannot lie down flat, speaks in phrases, mild retractions, audible wheezing, pulse 100-120.    - SEVERE: Very SOB at rest, speaks in single words, struggling to breathe, sitting hunched forward, retractions, pulse > 120.       Mild to moderate at times  10. OTHER SYMPTOMS: "Do you have any other symptoms?"  (e.g., fatigue, headache, muscle pain, weakness)       Chills, fatigue, sweating, HA 11. HIGH RISK DISEASE: "Do you have any chronic medical problems?" (e.g., asthma, heart or lung disease, weak immune system, obesity, etc.)       CHF  Protocols used: Coronavirus (COVID-19) Persisting Symptoms Follow-up Call-A-[facility]

## 2022-02-17 NOTE — Telephone Encounter (Signed)
Pt asking for medication

## 2022-02-18 ENCOUNTER — Ambulatory Visit: Payer: Medicaid Other | Attending: Critical Care Medicine | Admitting: Critical Care Medicine

## 2022-02-18 ENCOUNTER — Encounter: Payer: Self-pay | Admitting: Allergy

## 2022-02-18 ENCOUNTER — Other Ambulatory Visit: Payer: Self-pay

## 2022-02-18 ENCOUNTER — Ambulatory Visit: Payer: Medicaid Other

## 2022-02-18 ENCOUNTER — Encounter: Payer: Self-pay | Admitting: Critical Care Medicine

## 2022-02-18 DIAGNOSIS — U071 COVID-19: Secondary | ICD-10-CM

## 2022-02-18 MED ORDER — CEFDINIR 300 MG PO CAPS: 300.0000 mg | ORAL_CAPSULE | Freq: Two times a day (BID) | ORAL | 0 refills | Status: AC

## 2022-02-18 MED ORDER — FAMOTIDINE 20 MG PO TABS
ORAL_TABLET | ORAL | 1 refills | Status: DC
Start: 2022-02-18 — End: 2022-10-30

## 2022-02-18 MED ORDER — PROMETHAZINE-DM 6.25-15 MG/5ML PO SYRP: 5.0000 mL | ORAL_SOLUTION | Freq: Four times a day (QID) | ORAL | 0 refills | Status: DC | PRN

## 2022-02-18 MED ORDER — INSULIN ASPART 100 UNIT/ML IJ SOLN: 4.0000 [IU] | Freq: Two times a day (BID) | INTRAMUSCULAR | 99 refills | Status: DC

## 2022-02-18 MED ORDER — PREDNISONE 10 MG PO TABS: ORAL_TABLET | ORAL | 0 refills | Status: DC

## 2022-02-18 NOTE — Progress Notes (Signed)
Remote ICD transmission.   

## 2022-02-18 NOTE — Progress Notes (Signed)
Established Patient Office Visit  Subjective   Patient ID: [name], male    DOB: 1966/02/27  Age: 56 y.o. MRN: [id number] Virtual Visit via Telephone Note  I connected with [name] on 02/18/22 at  4:00 PM EDT by telephone and verified that I am speaking with the correct person using two identifiers.   Consent:  I discussed the limitations, risks, security and privacy concerns of performing an evaluation and management service by telephone and the availability of in person appointments. I also discussed with the patient that there may be a patient responsible charge related to this service. The patient expressed understanding and agreed to proceed.  Location of patient: Patient's at home  Location of provider: I am in my office  Persons participating in the televisit with the patient.   No one else on the call  COVID follow-up from ER visit  Post ED HFU Covid   This is a 56 year old male who was in the emergency room on the seventh he had been exposed to [place] when he visited his father is in a nursing home and also had [place].  His symptoms began on 6 September.  He was quite ill with cough shortness of breath he was given IV fluids blood sugar was high initially went down to 190 after IV fluids COVID test was positive he he received molnupiravir because he had too many other medications of to interact with Paxlovid.  He requested benzonatate cough pills but these were not sent out.  He now is having a productive cough with thick brown mucus that a bit more short of breath low-grade fevers blood sugar in the 380 range.  He has 1 day left on his oral antiviral.  Patient is taking 10 units of Lantus at bedtime and short acting insulin as well blood sugars are in the 300s now      Review of Systems  Constitutional:  Positive for chills, fever and malaise/fatigue. Negative for diaphoresis and weight loss.  HENT:  Negative for congestion, hearing loss, nosebleeds, sore  throat and tinnitus.   Eyes:  Negative for blurred vision, photophobia and redness.  Respiratory:  Positive for cough, hemoptysis, sputum production, shortness of breath and wheezing. Negative for stridor.   Cardiovascular:  Negative for chest pain, palpitations, orthopnea, claudication, leg swelling and PND.  Gastrointestinal:  Negative for abdominal pain, blood in stool, constipation, diarrhea, heartburn, nausea and vomiting.  Genitourinary:  Negative for dysuria, flank pain, frequency, hematuria and urgency.  Musculoskeletal:  Positive for myalgias. Negative for back pain, falls, joint pain and neck pain.  Skin:  Negative for itching and rash.  Neurological:  Negative for dizziness, tingling, tremors, sensory change, speech change, focal weakness, seizures, loss of consciousness, weakness and headaches.  Endo/Heme/Allergies:  Negative for environmental allergies and polydipsia. Does not bruise/bleed easily.  Psychiatric/Behavioral:  Negative for depression, memory loss, substance abuse and suicidal ideas. The patient is not nervous/anxious and does not have insomnia.       Objective:     There were no vitals taken for this visit.   Physical Exam No exam this is a phone visit  No results found for any visits on 02/18/22.    The 10-year ASCVD risk score (Arnett DK, et al., 2019) is: 18.7%    Assessment & Plan:   Problem List Items Addressed This Visit       Other   COVID-19 virus infection    Patient with COVID viral infection this is  a second infection he has had first 1 was in January 2021  The patient will receive cefdinir 300 mg twice a day for 7 days pulse prednisone he also will receive Promethazine DM for cough suppression  Because of his increased blood glucose we will have him increase Lantus to 15 units at bedtime  He has follow-up on September 28 with his primary care      Relevant Medications   cefdinir (OMNICEF) 300 MG capsule   Follow Up  Instructions: Patient knows cough syrup, antibiotics and prednisone sent to his local pharmacy [facility] and he is to keep his September 28 appointment with primary care Dr. Wynetta Emery   I discussed the assessment and treatment plan with the patient. The patient was provided an opportunity to ask questions and all were answered. The patient agreed with the plan and demonstrated an understanding of the instructions.   The patient was advised to call back or seek an in-person evaluation if the symptoms worsen or if the condition fails to improve as anticipated.  I provided 30 minutes of non-face-to-face time during this encounter  including  median intraservice time , review of notes, labs, imaging, medications  and explaining diagnosis and management to the patient .    [name] Noble, MD

## 2022-02-18 NOTE — Assessment & Plan Note (Signed)
Patient with COVID viral infection this is a second infection he has had first 1 was in January 2021  The patient will receive cefdinir 300 mg twice a day for 7 days pulse prednisone he also will receive Promethazine DM for cough suppression  Because of his increased blood glucose we will have him increase Lantus to 15 units at bedtime  He has follow-up on September 28 with his primary care

## 2022-02-24 ENCOUNTER — Ambulatory Visit (INDEPENDENT_AMBULATORY_CARE_PROVIDER_SITE_OTHER): Payer: Medicaid Other

## 2022-02-24 DIAGNOSIS — I5022 Chronic systolic (congestive) heart failure: Secondary | ICD-10-CM

## 2022-02-24 DIAGNOSIS — Z95810 Presence of automatic (implantable) cardiac defibrillator: Secondary | ICD-10-CM

## 2022-02-26 NOTE — Progress Notes (Signed)
EPIC Encounter for ICM Monitoring  Patient Name: [name] is a 56 y.o. male Date: 02/26/2022 Primary Care Physican: [name], MD Primary Cardiologist: Stanford Breed Electrophysiologist: [place] 07/03/2021 Weight 258 lbs 09/11/2021 Weight: 262 lbs 10/21/2021 Weight: Not weighing at home 01/09/2022 Office Weight: 262 lbs                            Spoke with patient and heart failure questions reviewed.  Pt asymptomatic for fluid accumulation.  He is trying to recover from [place].    [place] thoracic impedance suggesting possible fluid accumulation starting 8/27 (correlates with ED visit) and trending back toward baseline.     Prescribed dosage:  Furosemide 80 mg take 1 tablet (80 mg total) twice a day.    Potassium 10 mEq take 1 tablet daily.    Labs: 02/01/2022 Creatinine 2.30, BUN 32, Potassium 3.4, Sodium 131, GFR 32 01/30/2022 A1C   15.0 01/09/2022 Creatinine 1.91, BUN 19, Potassium 4.1, Sodium 135 08/26/2021 Creatinine 2.08, BUN 20, Potassium 3.9, Sodium 137, GFR 37, A1C 13% Complete set of lab results in result review.    Recommendations:  No changes and encouraged to call if experiencing any fluid symptoms.   Follow-up plan: ICM clinic phone appointment on 03/24/2022.   91 day device clinic remote transmission 04/04/2022.     EP/Cardiology Office Visits: Recall 07/08/2022 with [name].  Recall 09/15/2021 with Oda Kilts, PA   Copy of ICM check sent to Dr Myles Gip.   3 month ICM trend: 02/25/2022.    12-14 Month ICM trend:     [name], RN 02/26/2022 3:01 PM

## 2022-02-28 ENCOUNTER — Other Ambulatory Visit: Payer: Self-pay | Admitting: Internal Medicine

## 2022-02-28 ENCOUNTER — Encounter: Payer: Self-pay | Admitting: Internal Medicine

## 2022-02-28 DIAGNOSIS — E1165 Type 2 diabetes mellitus with hyperglycemia: Secondary | ICD-10-CM

## 2022-02-28 MED ORDER — LANTUS SOLOSTAR 100 UNIT/ML ~~LOC~~ SOPN: 8.0000 [IU] | PEN_INJECTOR | Freq: Every day | SUBCUTANEOUS | 0 refills | Status: DC

## 2022-02-28 NOTE — Telephone Encounter (Signed)
Requested Prescriptions  Pending Prescriptions Disp Refills  . insulin glargine (LANTUS SOLOSTAR) 100 UNIT/ML Solostar Pen 15 mL 0    Sig: Inject 8 Units into the skin daily.     Endocrinology:  Diabetes - Insulins Failed - 02/28/2022  5:54 PM      Failed - HBA1C is between 0 and 7.9 and within 180 days    HbA1c, POC (controlled diabetic range)  Date Value Ref Range Status  01/30/2022 15.0 (A) 0.0 - 7.0 % Final    Comment:    Greater than 15.0         Passed - Valid encounter within last 6 months    Recent Outpatient Visits          1 week ago COVID-19 virus infection   [place] Elsie Stain, MD   3 weeks ago Type 2 diabetes mellitus with hyperglycemia, without long-term current use of insulin [facility])   [place], Annie Main L, RPH-CPP   4 weeks ago Type 2 diabetes mellitus with hyperglycemia, without long-term current use of insulin [facility])   [place], MD   8 months ago Type 2 diabetes mellitus with obesity [facility])   [place], MD   10 months ago Sore throat   [place], MD      Future Appointments            In 4 days [name], MD [place]

## 2022-02-28 NOTE — Telephone Encounter (Signed)
Medication Refill - Medication: insulin glargine (LANTUS SOLOSTAR) 100 UNIT/ML Solostar Pen [[id number]]   Has the patient contacted their pharmacy? Yes.   (Agent: If no, request that the patient contact the pharmacy for the refill. If patient does not wish to contact the pharmacy document the reason why and proceed with request.) (Agent: If yes, when and what did the pharmacy advise?)  Preferred Pharmacy (with phone number or street name):  [place], [place] [address]  Phone: [phone] Fax: [phone]  Hours: Not open 24 hours   Has the patient been seen for an appointment in the last year OR does the patient have an upcoming appointment? Yes.    Agent: Please be advised that RX refills may take up to 3 business days. We ask that you follow-up with your pharmacy.

## 2022-03-01 ENCOUNTER — Other Ambulatory Visit: Payer: Self-pay | Admitting: Internal Medicine

## 2022-03-01 MED ORDER — PEN NEEDLES 31G X 8 MM MISC: 6 refills | Status: DC

## 2022-03-04 ENCOUNTER — Ambulatory Visit
Admission: RE | Admit: 2022-03-04 | Discharge: 2022-03-04 | Disposition: A | Discharge status: Home / Self Care | Payer: Medicaid Other | Source: Ambulatory Visit | Attending: Internal Medicine | Admitting: Internal Medicine

## 2022-03-04 ENCOUNTER — Ambulatory Visit: Payer: Medicaid Other | Attending: Internal Medicine | Admitting: Internal Medicine

## 2022-03-04 ENCOUNTER — Other Ambulatory Visit: Payer: Self-pay | Admitting: Internal Medicine

## 2022-03-04 ENCOUNTER — Encounter: Payer: Self-pay | Admitting: Internal Medicine

## 2022-03-04 VITALS — BP 114/78 | HR 77 | Ht 72.0 in | Wt 258.2 lb

## 2022-03-04 DIAGNOSIS — R0602 Shortness of breath: Secondary | ICD-10-CM | POA: Diagnosis not present

## 2022-03-04 DIAGNOSIS — R0989 Other specified symptoms and signs involving the circulatory and respiratory systems: Secondary | ICD-10-CM | POA: Diagnosis not present

## 2022-03-04 DIAGNOSIS — Z794 Long term (current) use of insulin: Secondary | ICD-10-CM

## 2022-03-04 DIAGNOSIS — Z2821 Immunization not carried out because of patient refusal: Secondary | ICD-10-CM

## 2022-03-04 DIAGNOSIS — U071 COVID-19: Secondary | ICD-10-CM

## 2022-03-04 DIAGNOSIS — E1165 Type 2 diabetes mellitus with hyperglycemia: Secondary | ICD-10-CM | POA: Diagnosis not present

## 2022-03-04 MED ORDER — FREESTYLE LIBRE 3 SENSOR MISC: 12 refills | Status: DC

## 2022-03-04 MED ORDER — INSULIN ASPART 100 UNIT/ML IJ SOLN: 6.0000 [IU] | Freq: Three times a day (TID) | INTRAMUSCULAR | 99 refills | Status: DC

## 2022-03-04 MED ORDER — LANTUS SOLOSTAR 100 UNIT/ML ~~LOC~~ SOPN: 20.0000 [IU] | PEN_INJECTOR | Freq: Every day | SUBCUTANEOUS | 0 refills | Status: DC

## 2022-03-04 NOTE — Telephone Encounter (Signed)
Requested Prescriptions  Pending Prescriptions Disp Refills  . Continuous Blood Gluc Receiver (FREESTYLE LIBRE 2 READER) [place] [Pharmacy Med Name: FREESTYLE LIBRE 2 READER MIS] 1 each 0    Sig: USE AS DIRECTED     Endocrinology: Diabetes - Testing Supplies Passed - 03/04/2022  9:54 AM      Passed - Valid encounter within last 12 months    Recent Outpatient Visits          2 weeks ago COVID-19 virus infection   [place] Elsie Stain, MD   3 weeks ago Type 2 diabetes mellitus with hyperglycemia, without long-term current use of insulin [facility])   [place], Jarome Matin, RPH-CPP   1 month ago Type 2 diabetes mellitus with hyperglycemia, without long-term current use of insulin [facility])   [place], MD   8 months ago Type 2 diabetes mellitus with obesity [facility])   [place], MD   11 months ago Sore throat   [place], Deborah B, MD      Future Appointments            Today [name], MD [place]

## 2022-03-04 NOTE — Patient Instructions (Signed)
Increase Lantus insulin to 20 units daily. Increase NovoLog insulin to 6 units with meals.  If after 2 days, your morning blood sugars are still greater than 150, increase Lantus insulin to 23 units.  We will have you follow-up with our clinical pharmacist in a few weeks.

## 2022-03-04 NOTE — Progress Notes (Signed)
Patient ID: [name], male    DOB: Jul 03, 1965  MRN: [id number]  CC: Diabetes   Subjective: [name] is a 56 y.o. male who presents for f/u DM and COVID His concerns today include:  Patient with history of NICM, systolic CHF s/p ICD placement, DM type 2, HL, CKD stage 3,  obesity, BPH, GERD and chronic LBP secondary to spondylosis and spinal stenosis, COVID-19 infection.  Had COVID since last visit with me in August.  Seen in the emergency room and had follow-up telephone visit with Dr. Joya Gaskins.  He was still having symptoms with productive cough so he was placed on 7 days of cefdinir 300 mg twice a day and 40 mg of prednisone for 3 days.  He has completed the therapy. 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 Has HA, feels cold, tired and congested.   Had only 1 COVID-19 vaccine in 2021.  Decided not to complete the vaccine series because he felt the first shot made him too sick.   DM:   Did get [place] device.  Reports blood sugars have been in the 300s.  I looked at his device and saw indeed blood sugars have been in the upper 300s the majority of the times. Currently on Lantus 15 units daily, NovoLog 4 units with meals and Trulicity.  Reports compliance with the medications.  He feels overwhelmed at times with a number of medications that he is having to take and blood sugar is still high.   Patient Active Problem List   Diagnosis Date Noted   CKD (chronic kidney disease) stage 2, GFR 60-89 ml/min 10/24/2019   Acute non-recurrent frontal sinusitis 06/27/2019   COVID-19 virus infection 06/27/2019   Glaucoma suspect 12/22/2018   Seasonal and perennial allergic rhinoconjunctivitis 10/25/2018   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   Urticaria 05/03/2018   S/P inguinal hernia repair 03/01/2018   Unilateral inguinal hernia without obstruction or gangrene 07/24/2017   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   Hypersomnia 02/28/2016   Abnormal  PFT 02/24/2016   Shortness of breath 01/28/2016   Diabetic polyneuropathy associated with type 2 diabetes mellitus ([place]) 12/18/2015   Lumbar back pain 12/18/2015   Controlled type 2 diabetes mellitus with complication, with long-term current use of insulin ([place]) 09/17/2015   Left median nerve neuropathy 04/16/2015   Lesion of right median nerve at forearm 04/16/2015   ICD (implantable cardioverter-defibrillator) in place 28/31/5176   Chronic systolic congestive heart failure ([place]) 03/28/2014   Nonischemic cardiomyopathy ([place]) 11/09/2013   Syncope 10/20/2013   Chronic chest pain 07/28/2013   RENAL CALCULUS 05/13/2009   Obesity 05/08/2009   Former heavy cigarette smoker (20-39 per day) 05/08/2009   HOARSENESS 05/08/2009   ANXIETY 02/28/2008   ASTHMATIC BRONCHITIS, ACUTE 02/28/2008   GERD 02/28/2008   IRRITABLE BOWEL SYNDROME 02/28/2008     Current Outpatient Medications on File Prior to Visit  Medication Sig Dispense Refill   Accu-Chek Softclix Lancets lancets Use as instructed 100 each 12   albuterol (VENTOLIN HFA) 108 (90 Base) MCG/ACT inhaler Inhale into the lungs every 6 (six) hours as needed for wheezing or shortness of breath.     aspirin 81 MG tablet Take 81 mg by mouth daily.     atorvastatin (LIPITOR) 10 MG tablet Take 1 tablet (10 mg total) by mouth daily. 90 tablet 1   Blood Glucose Monitoring Suppl (ACCU-CHEK GUIDE) w/Device KIT UAD 1 kit 0   carvedilol (COREG) 6.25 MG tablet  Take 1 tablet (6.25 mg total) by mouth 2 (two) times daily. 60 tablet 7   cetirizine (ZYRTEC) 10 MG tablet Take 1 tablet (10 mg total) by mouth daily. 90 tablet 1   clobetasol ointment (TEMOVATE) 6.80 % Apply 1 Application topically 2 (two) times daily. 30 g 3   Continuous Blood Gluc Sensor (FREESTYLE LIBRE 3 SENSOR) MISC Change sensor every 2 weeks. 2 each 12   dapagliflozin propanediol (FARXIGA) 5 MG TABS tablet Take 1 tablet (5 mg total) by mouth daily before breakfast. 90 tablet 1   Dulaglutide  (TRULICITY) 3.21 YY/4.8GN SOPN Inject 0.75 mg into the skin once a week. 2 mL 2   EPINEPHrine 0.3 mg/0.3 mL IJ SOAJ injection Inject 0.3 mg into the muscle as needed. 1 each 1   famotidine (PEPCID) 20 MG tablet Take 1 tablet by mouth 1-2 times a day as needed. 90 tablet 1   fluticasone (FLONASE) 50 MCG/ACT nasal spray Use 1-2 sprays in each nostril once a day as needed for nasal congestion. 16 g 5   furosemide (LASIX) 80 MG tablet Take 1 tablet (80 mg total) by mouth 2 (two) times daily. 180 tablet 3   gabapentin (NEURONTIN) 300 MG capsule TAKE 1 CAPSULE BY MOUTH EVERY DAY AT BEDTIME 90 capsule 1   gabapentin (NEURONTIN) 300 MG capsule Take 1 capsule by mouth at bedtime 90 capsule 1   glucose blood (ACCU-CHEK GUIDE) test strip Use as instructed 100 each 12   insulin aspart (NOVOLOG) 100 UNIT/ML injection Inject 4 Units into the skin 2 (two) times daily before a meal. 10 mL PRN   insulin glargine (LANTUS SOLOSTAR) 100 UNIT/ML Solostar Pen Inject 8 Units into the skin daily. 15 mL 0   Insulin Pen Needle (PEN NEEDLES) 31G X 8 MM MISC UAD 100 each 6   Lancet Devices (ACCU-CHEK SOFTCLIX) lancets 1 each by Other route 3 (three) times daily. 1 each 0   methocarbamol (ROBAXIN) 500 MG tablet TAKE 1 TABLET BY MOUTH ONCE DAILY AS NEEDED FOR MUSCLE SPASM 30 tablet 3   mometasone (ELOCON) 0.1 % cream Apply 1 application topically daily. 45 g 1   potassium chloride (KLOR-CON) 10 MEQ tablet Take 10 mEq by mouth daily.     predniSONE (DELTASONE) 10 MG tablet Take 4 tablets daily for 3 days then stop 12 tablet 0   promethazine-dextromethorphan (PROMETHAZINE-DM) 6.25-15 MG/5ML syrup Take 5 mLs by mouth 4 (four) times daily as needed for cough. 180 mL 0   sacubitril-valsartan (ENTRESTO) 24-26 MG Take 1 tablet by mouth 2 (two) times daily. 180 tablet 3   sildenafil (VIAGRA) 100 MG tablet Take 1-2 tabs PO 1/2-1 hr prior to intercourse. 20 tablet 4   tamsulosin (FLOMAX) 0.4 MG CAPS capsule Take 2 capsules (0.8 mg  total) by mouth daily. 180 capsule 1   triamcinolone cream (KENALOG) 0.1 % Apply sparingly to red itchy areas twice daily as needed. Avoid face, neck, armpits or groin area. Do not use more than 3 weeks in a row. 45 g 5   No current facility-administered medications on file prior to visit.    Allergies  Allergen Reactions   Bee Venom Anaphylaxis    Social History   Socioeconomic History   Marital status: Single    Spouse name: Not on file   Number of children: Not on file   Years of education: Not on file   Highest education level: Not on file  Occupational History   Occupation: Disabled  Tobacco Use  Smoking status: Former    Packs/day: 1.00    Years: 28.00    Total pack years: 28.00    Types: Cigarettes    Quit date: 06/03/2014    Years since quitting: 7.7   Smokeless tobacco: Never  Vaping Use   Vaping Use: Never used  Substance and Sexual Activity   Alcohol use: Yes   Drug use: Yes    Types: Marijuana    Comment: occasionally   Sexual activity: Yes  Other Topics Concern   Not on file  Social History Narrative   Pt lives with his brother    Social Determinants of Health   Financial Resource Strain: Not on file  Food Insecurity: No Food Insecurity (12/11/2020)   Hunger Vital Sign    Worried About Running Out of Food in the Last Year: Never true    Ran Out of Food in the Last Year: Never true  Transportation Needs: No Transportation Needs (03/12/2021)   PRAPARE - Hydrologist (Medical): No    Lack of Transportation (Non-Medical): No  Physical Activity: Not on file  Stress: Not on file  Social Connections: Moderately Integrated (03/12/2021)   Social Connection and Isolation Panel [NHANES]    Frequency of Communication with Friends and Family: More than three times a week    Frequency of Social Gatherings with Friends and Family: More than three times a week    Attends Religious Services: More than 4 times per year    Active Member  of [employer] or Organizations: Yes    Attends Archivist Meetings: Never    Marital Status: Never married  Human resources officer Violence: Not on file    Family History  Problem Relation Age of Onset   Diabetes Father    Arthritis Father    Hypertension Father    Diabetes Mother    Arthritis Mother    Hypertension Mother    Hypertension Brother     Past Surgical History:  Procedure Laterality Date   CARDIAC CATHETERIZATION  08/10/13   CHOLECYSTECTOMY  ~ 2010   IMPLANTABLE CARDIOVERTER DEFIBRILLATOR IMPLANT  12/23/2013   STJ Fortify ICD implanted by [name] for cardiomyopathy and syncope   IMPLANTABLE CARDIOVERTER DEFIBRILLATOR IMPLANT N/A 12/23/2013   Procedure: IMPLANTABLE CARDIOVERTER DEFIBRILLATOR IMPLANT;  Surgeon: Coralyn Mark, MD;  Location: [facility]ATH [facility];  Service: Cardiovascular;  Laterality: N/A;   INGUINAL HERNIA REPAIR Left 03/01/2018   Procedure: OPEN REPAIR OF LEFT INGUINAL HERNIA WITH MESH;  Surgeon: Greer Pickerel, MD;  Location: [facility];  Service: General;  Laterality: Left;   LEFT HEART CATHETERIZATION WITH CORONARY ANGIOGRAM N/A 08/10/2013   Procedure: LEFT HEART CATHETERIZATION WITH CORONARY ANGIOGRAM;  Surgeon: Jettie Booze, MD;  Location[facility] CATH [facility];  Service: Cardiovascular;  Laterality: N/A;   ORIF HUMERUS FRACTURE Left 08/12/2013   Procedure: OPEN REDUCTION INTERNAL FIXATION (ORIF) HUMERAL SHAFT FRACTURE;  Surgeon: Newt Minion, MD;  Location: [facility];  Service: Orthopedics;  Laterality: Left;  Open Reduction Internal Fixation Left Humerus   URETEROSCOPY     "laser for kidney stones"    ROS: Review of Systems Negative except as stated above  PHYSICAL EXAM: BP 114/78   Pulse 77   Ht 6' (1.829 m)   Wt 258 lb 3.2 oz (117.1 kg)   SpO2 96%   BMI 35.02 kg/m   Physical Exam  General appearance - alert, well appearing, and in no distress Mental status - normal mood, behavior, speech, dress, motor  activity, and thought processes Chest -breath sounds are  mildly decreased bilaterally.  No crackles or wheezes heard. Heart -regular rate and rhythm. Extremities -no lower extremity edema.      Latest Ref Rng & Units 02/13/2022    4:48 PM 02/01/2022    5:04 PM 02/01/2022    5:02 PM  CMP  Glucose 70 - 99 mg/dL 300   495   BUN 6 - 20 mg/dL 26   32   Creatinine 0.61 - 1.24 mg/dL 2.14   2.30   Sodium 135 - 145 mmol/L 140  131  131   Potassium 3.5 - 5.1 mmol/L 3.7  3.5  3.4   Chloride 98 - 111 mmol/L 104   93   CO2 22 - 32 mmol/L 26   24   Calcium 8.9 - 10.3 mg/dL 9.2   9.5    Lipid Panel     Component Value Date/Time   CHOL 178 08/26/2021 1608   TRIG 336 (H) 08/26/2021 1608   HDL 42 08/26/2021 1608   CHOLHDL 4.2 08/26/2021 1608   CHOLHDL 4 05/08/2009 1040   VLDL 27.0 05/08/2009 1040   LDLCALC 81 08/26/2021 1608    CBC    Component Value Date/Time   WBC 6.4 02/13/2022 1648   RBC 4.52 02/13/2022 1648   HGB 13.9 02/13/2022 1648   HGB 14.0 08/26/2021 1608   HCT 42.0 02/13/2022 1648   HCT 40.9 08/26/2021 1608   PLT 283 02/13/2022 1648   PLT 286 08/26/2021 1608   MCV 92.9 02/13/2022 1648   MCV 88 08/26/2021 1608   MCH 30.8 02/13/2022 1648   MCHC 33.1 02/13/2022 1648   RDW 13.7 02/13/2022 1648   RDW 13.0 08/26/2021 1608   LYMPHSABS 1.7 06/28/2018 1440   MONOABS 0.6 09/21/2017 2158   EOSABS 0.3 06/28/2018 1440   BASOSABS 0.0 06/28/2018 1440    ASSESSMENT AND PLAN:  1. Type 2 diabetes mellitus with hyperglycemia, with long-term current use of insulin (HCC) Not at goal.  Recommend increase Lantus insulin to 20 units daily and NovoLog to 6 units with meals.  If after 2 to 3 days, his morning blood sugars are still running greater than 150, advised to increase Lantus to 24 units. We will have him follow-up with the clinical pharmacist in 2 weeks. - insulin aspart (NOVOLOG) 100 UNIT/ML injection; Inject 6 Units into the skin 3 (three) times daily with meals.  Dispense: 10 mL; Refill: PRN - insulin glargine (LANTUS SOLOSTAR) 100  UNIT/ML Solostar Pen; Inject 20 Units into the skin daily.  Dispense: 15 mL; Refill: 0 - Continuous Blood Gluc Sensor (FREESTYLE LIBRE 3 SENSOR) MISC; Change sensor every 2 weeks.  Dispense: 2 each; Refill: 12  2. COVID-19 virus infection Advised patient that it may take several weeks for some of the residual symptoms of COVID to resolve.  He feels he still has consistent chest congestion so we will do a chest x-ray and refer him to pulmonary. Strongly advise he reconsider completing the COVID-19 vaccine series given his comorbidities. - Ambulatory referral to Pulmonology  3. Chest congestion See #2 above. - DG Chest 2 View; Future - Ambulatory referral to Pulmonology  4. Influenza vaccination declined Recommended.  Patient declined.  Patient was given the opportunity to ask questions.  Patient verbalized understanding of the plan and was able to repeat key elements of the plan.   This documentation was completed using Radio producer.  Any transcriptional errors are unintentional.  No orders of  the defined types were placed in this encounter.    Requested Prescriptions    No prescriptions requested or ordered in this encounter    No follow-ups on file.  Karle Plumber, MD, FACP

## 2022-03-05 ENCOUNTER — Encounter: Payer: Self-pay | Admitting: Internal Medicine

## 2022-03-06 ENCOUNTER — Other Ambulatory Visit: Payer: Self-pay | Admitting: Pharmacist

## 2022-03-06 DIAGNOSIS — E1165 Type 2 diabetes mellitus with hyperglycemia: Secondary | ICD-10-CM

## 2022-03-06 MED ORDER — FREESTYLE LIBRE 2 SENSOR MISC: 3 refills | Status: DC

## 2022-03-06 MED ORDER — FREESTYLE LIBRE 2 READER DEVI: 0 refills | Status: DC

## 2022-03-06 NOTE — Telephone Encounter (Signed)
[facility] called asking for a new order for the [place] 2 instead of the 3.  CB@  [phone]

## 2022-03-06 NOTE — Telephone Encounter (Signed)
The patient is calling back to make sure his provider saw his mychart message from last night regarding the [place] system. Please assist patient as soon as possible.

## 2022-03-15 NOTE — Progress Notes (Unsigned)
Synopsis: Referred in October 2023 for chest congestion after having COVID.  Has a history of systolic heart failure from nonischemic cardiomyopathy followed by [name]. Previously followed by Dr. Lenna Gilford for chronic bronchitis and Dr. Corrie Dandy for recurrent bronchitis and OSA.  Subjective:   PATIENT ID: [name] GENDER: male DOB: 08-07-1965, MRN: [id number]   HPI  Chief Complaint  Patient presents with   Consult    Referred by PCP for history of SOB. Had COVID back in September but the SOB started before then. Productive cough with yellow phlegm.     Willet says that he has always had shortness of breath since having heart failure > however after COVID last month he has been much more short of breath and feels like he is gasping for air > he had a diagnosis of chronic bronchitis treated by Dr. Lenna Gilford years ago. > he hasn't used an inhaler for years  He says that at first after he had COVID last month he was coughing up a lot more mucus, it's not as bad now.  He was treated with antibiotics.    He still has a lot of sinus congestion and still feels dyspneaic.    He had bronchitis as a kid, it would flare from time to time  He used to smoke 2ppd for many years, quit once for 6 years, then quit again about 7 years.  He started as a 15.  56 pack year history.   Since quitting smokig 7 years ago, he's never felt this badly. He even had COVID a year ago and it wasn't that bad.  He says that this time he was feeling more week and dyspneic than baseline.   He thinks he's gained a couple of pounds since last month, but he hasn't been swelling mre.    Record review: Video visit with Dr. [name] from September 2023 reviewed where the patient had recently been diagnosed with COVID after visiting his father in a nursing home.  He was having cough productive of thick mucus.  He was treated with cefdinir and prednisone.  He had completed antiviral therapy.  Adjustments were made to  his Lantus.  Past Medical History:  Diagnosis Date   AICD (automatic cardioverter/defibrillator) present    Chronic bronchitis ([place])    "get it most q yr" (12/23/2013)   Chronic systolic (congestive) heart failure ([place])    Fracture of left humerus    a. 07/2013.   GERD (gastroesophageal reflux disease)    not at present time   Heart murmur    "born w/it"    History of renal calculi    Hypertension    Kidney stones    NICM (nonischemic cardiomyopathy) ([place])    a. 07/2013 Echo: EF 20-25%, diff HK, Gr2 DD, mild MR, mod dil LA/RA. 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    Shockable heart rhythm detected by automated external defibrillator    Syncope    Type II diabetes mellitus ([place]) 2006     Family History  Problem Relation Age of Onset   Diabetes Father    Arthritis Father    Hypertension Father    Diabetes Mother    Arthritis Mother    Hypertension Mother    Hypertension Brother      Social History   Socioeconomic History   Marital status: Single    Spouse name: Not on file  Number of children: Not on file   Years of education: Not on file   Highest education level: Not on file  Occupational History   Occupation: Disabled  Tobacco Use   Smoking status: Former    Packs/day: 1.00    Years: 28.00    Total pack years: 28.00    Types: Cigarettes    Quit date: 06/03/2014    Years since quitting: 7.8   Smokeless tobacco: Never  Vaping Use   Vaping Use: Never used  Substance and Sexual Activity   Alcohol use: Yes   Drug use: Yes    Types: Marijuana    Comment: occasionally   Sexual activity: Yes  Other Topics Concern   Not on file  Social History Narrative   Pt lives with his brother    Social Determinants of Health   Financial Resource Strain: Not on file  Food Insecurity: No Food Insecurity (12/11/2020)   Hunger Vital Sign    Worried About Running Out of Food in the Last Year:  Never true    Ran Out of Food in the Last Year: Never true  Transportation Needs: No Transportation Needs (03/12/2021)   PRAPARE - Hydrologist (Medical): No    Lack of Transportation (Non-Medical): No  Physical Activity: Not on file  Stress: Not on file  Social Connections: Moderately Integrated (03/12/2021)   Social Connection and Isolation Panel [NHANES]    Frequency of Communication with Friends and Family: More than three times a week    Frequency of Social Gatherings with Friends and Family: More than three times a week    Attends Religious Services: More than 4 times per year    Active Member of [employer] or Organizations: Yes    Attends Archivist Meetings: Never    Marital Status: Never married  Human resources officer Violence: Not on file     Allergies  Allergen Reactions   Bee Venom Anaphylaxis     Outpatient Medications Prior to Visit  Medication Sig Dispense Refill   Accu-Chek Softclix Lancets lancets Use as instructed 100 each 12   albuterol (VENTOLIN HFA) 108 (90 Base) MCG/ACT inhaler Inhale into the lungs every 6 (six) hours as needed for wheezing or shortness of breath.     aspirin 81 MG tablet Take 81 mg by mouth daily.     atorvastatin (LIPITOR) 10 MG tablet Take 1 tablet (10 mg total) by mouth daily. 90 tablet 1   Blood Glucose Monitoring Suppl (ACCU-CHEK GUIDE) w/Device KIT UAD 1 kit 0   carvedilol (COREG) 6.25 MG tablet Take 1 tablet (6.25 mg total) by mouth 2 (two) times daily. 60 tablet 7   cetirizine (ZYRTEC) 10 MG tablet Take 1 tablet (10 mg total) by mouth daily. 90 tablet 1   clobetasol ointment (TEMOVATE) 5.64 % Apply 1 Application topically 2 (two) times daily. 30 g 3   Continuous Blood Gluc Receiver (FREESTYLE LIBRE 2 READER) DEVI Use to check blood sugar TID.  E11.65 1 each 0   Continuous Blood Gluc Sensor (FREESTYLE LIBRE 2 SENSOR) MISC Use to check blood sugar TID. Change sensor once every 14 days. E11.65 2 each 3    dapagliflozin propanediol (FARXIGA) 5 MG TABS tablet Take 1 tablet (5 mg total) by mouth daily before breakfast. 90 tablet 1   Dulaglutide (TRULICITY) 3.32 RJ/1.8AC SOPN Inject 0.75 mg into the skin once a week. 2 mL 2   EPINEPHrine 0.3 mg/0.3 mL IJ SOAJ injection Inject 0.3 mg  into the muscle as needed. 1 each 1   famotidine (PEPCID) 20 MG tablet Take 1 tablet by mouth 1-2 times a day as needed. 90 tablet 1   fluticasone (FLONASE) 50 MCG/ACT nasal spray Use 1-2 sprays in each nostril once a day as needed for nasal congestion. 16 g 5   furosemide (LASIX) 80 MG tablet Take 1 tablet (80 mg total) by mouth 2 (two) times daily. 180 tablet 3   gabapentin (NEURONTIN) 300 MG capsule TAKE 1 CAPSULE BY MOUTH EVERY DAY AT BEDTIME 90 capsule 1   gabapentin (NEURONTIN) 300 MG capsule Take 1 capsule by mouth at bedtime 90 capsule 1   glucose blood (ACCU-CHEK GUIDE) test strip Use as instructed 100 each 12   insulin aspart (NOVOLOG) 100 UNIT/ML injection Inject 6 Units into the skin 3 (three) times daily with meals. 10 mL PRN   insulin glargine (LANTUS SOLOSTAR) 100 UNIT/ML Solostar Pen Inject 24 Units into the skin daily. 15 mL 1   Insulin Pen Needle (PEN NEEDLES) 31G X 8 MM MISC UAD 100 each 6   Lancet Devices (ACCU-CHEK SOFTCLIX) lancets 1 each by Other route 3 (three) times daily. 1 each 0   methocarbamol (ROBAXIN) 500 MG tablet TAKE 1 TABLET BY MOUTH ONCE DAILY AS NEEDED FOR MUSCLE SPASM 30 tablet 3   mometasone (ELOCON) 0.1 % cream Apply 1 application topically daily. 45 g 1   potassium chloride (KLOR-CON) 10 MEQ tablet Take 10 mEq by mouth daily.     promethazine-dextromethorphan (PROMETHAZINE-DM) 6.25-15 MG/5ML syrup Take 5 mLs by mouth 4 (four) times daily as needed for cough. 180 mL 0   sacubitril-valsartan (ENTRESTO) 24-26 MG Take 1 tablet by mouth 2 (two) times daily. 180 tablet 3   sildenafil (VIAGRA) 100 MG tablet Take 1-2 tabs PO 1/2-1 hr prior to intercourse. 20 tablet 4   tamsulosin (FLOMAX)  0.4 MG CAPS capsule Take 2 capsules (0.8 mg total) by mouth daily. 180 capsule 1   triamcinolone cream (KENALOG) 0.1 % Apply sparingly to red itchy areas twice daily as needed. Avoid face, neck, armpits or groin area. Do not use more than 3 weeks in a row. 45 g 5   No facility-administered medications prior to visit.    Review of Systems  Constitutional:  Negative for chills, fever, malaise/fatigue and weight loss.  HENT:  Negative for congestion, nosebleeds, sinus pain and sore throat.   Eyes:  Negative for photophobia, pain and discharge.  Respiratory:  Positive for cough, sputum production, shortness of breath and wheezing. Negative for hemoptysis.   Cardiovascular:  Negative for chest pain, palpitations, orthopnea and leg swelling.  Gastrointestinal:  Negative for abdominal pain, constipation, diarrhea, nausea and vomiting.  Genitourinary:  Negative for dysuria, frequency, hematuria and urgency.  Musculoskeletal:  Negative for back pain, joint pain, myalgias and neck pain.  Skin:  Negative for itching and rash.  Neurological:  Negative for tingling, tremors, sensory change, speech change, focal weakness, seizures, weakness and headaches.  Psychiatric/Behavioral:  Negative for memory loss, substance abuse and suicidal ideas. The patient is not nervous/anxious.       Objective:  Physical Exam   Vitals:   03/21/22 1412  BP: 122/70  Pulse: 81  SpO2: 95%  Weight: 259 lb (117.5 kg)  Height: 6' (1.829 m)    Gen: ill appearing, fatigued HENT: NCAT, OP clear, neck supple without masses Eyes: PERRL, EOMi Lymph: no cervical lymphadenopathy PULM: Wheezing upper lobes bilaterally CV: RRR, no mgr, no JVD GI: BS+,  soft, nontender, no hsm Derm: no rash or skin breakdown MSK: normal bulk and tone Neuro: A&Ox4, CN II-XII intact, strength 5/5 in all 4 extremities Psyche: normal mood and affect   CBC    Component Value Date/Time   WBC 6.4 02/13/2022 1648   RBC 4.52 02/13/2022 1648    HGB 13.9 02/13/2022 1648   HGB 14.0 08/26/2021 1608   HCT 42.0 02/13/2022 1648   HCT 40.9 08/26/2021 1608   PLT 283 02/13/2022 1648   PLT 286 08/26/2021 1608   MCV 92.9 02/13/2022 1648   MCV 88 08/26/2021 1608   MCH 30.8 02/13/2022 1648   MCHC 33.1 02/13/2022 1648   RDW 13.7 02/13/2022 1648   RDW 13.0 08/26/2021 1608   LYMPHSABS 1.7 06/28/2018 1440   MONOABS 0.6 09/21/2017 2158   EOSABS 0.3 06/28/2018 1440   BASOSABS 0.0 06/28/2018 1440     Chest imaging: March 04, 2022 chest x-ray images independently reviewed showing normal pulmonary parenchyma, enlarged cardiac silhouette, ICD implant  PFT: 2017 PFT ratio 73%, FEV1 2.36 L 64% TLC 5.68 L 75% predicted (ERV elevated), DLCO 19.77 mL/min/mmHg 54% predicted.  Shape of flow volume loop suggestive of variable upper ariway obstruction  Labs:  Path:  Echo:  Heart Catheterization:       Assessment & Plan:   Acute bronchitis, unspecified organism  Chronic systolic congestive heart failure (HCC)  COVID-19 virus infection  Acute cough  Discussion: 56 year old male with a history of asthmatic bronchitis previously followed by this clinic by multiple providers returns again today for evaluation of ongoing chest congestion shortness of breath and cough after COVID infection a month ago.  Unfortunately at this point it is too late to treat with antiviral treatment.  He says this is in keeping with prior severe bronchitis episodes he said in the past which have required antibiotic treatment as well as bronchodilators.  In review of records from my partners in the past it seems that short acting bronchodilators have been most effective, he has never been prescribed long-term controller medicines as pulmonary function testing has never shown COPD.  This is surprising given his considerable smoking history.  Asthmatic bronchitis with exacerbation: Doxycycline 100 mg twice a day x5 days Use DuoNeb as needed for chest tightness  wheezing or shortness of breath, you can use this 3-4 times a day at home Chest x-ray today  Chronic systolic heart failure with increased dyspnea: BNP Chest x-ray  Cough, acute secondary to viral infection, refractory to over-the-counter medicines: Reviewed risks and benefits of narcotic treatment with patient, he does not have a history of alcohol abuse or substance abuse.  No record of recent narcotics in the [facility] registry. Tussionex every 12 hours as needed cough  We will order a lung function test to be performed when you come back on the next visit. Follow up 6 weeks me or NP   Immunizations: Immunization History  Administered Date(s) Administered   PFIZER(Purple Top)SARS-COV-2 Vaccination 10/21/2019     Current Outpatient Medications:    Accu-Chek Softclix Lancets lancets, Use as instructed, Disp: 100 each, Rfl: 12   albuterol (VENTOLIN HFA) 108 (90 Base) MCG/ACT inhaler, Inhale into the lungs every 6 (six) hours as needed for wheezing or shortness of breath., Disp: , Rfl:    aspirin 81 MG tablet, Take 81 mg by mouth daily., Disp: , Rfl:    atorvastatin (LIPITOR) 10 MG tablet, Take 1 tablet (10 mg total) by mouth daily., Disp: 90 tablet, Rfl: 1  Blood Glucose Monitoring Suppl (ACCU-CHEK GUIDE) w/Device KIT, UAD, Disp: 1 kit, Rfl: 0   carvedilol (COREG) 6.25 MG tablet, Take 1 tablet (6.25 mg total) by mouth 2 (two) times daily., Disp: 60 tablet, Rfl: 7   cetirizine (ZYRTEC) 10 MG tablet, Take 1 tablet (10 mg total) by mouth daily., Disp: 90 tablet, Rfl: 1   clobetasol ointment (TEMOVATE) 8.18 %, Apply 1 Application topically 2 (two) times daily., Disp: 30 g, Rfl: 3   Continuous Blood Gluc Receiver (FREESTYLE LIBRE 2 READER) DEVI, Use to check blood sugar TID.  E11.65, Disp: 1 each, Rfl: 0   Continuous Blood Gluc Sensor (FREESTYLE LIBRE 2 SENSOR) MISC, Use to check blood sugar TID. Change sensor once every 14 days. E11.65, Disp: 2 each, Rfl: 3   dapagliflozin  propanediol (FARXIGA) 5 MG TABS tablet, Take 1 tablet (5 mg total) by mouth daily before breakfast., Disp: 90 tablet, Rfl: 1   Dulaglutide (TRULICITY) 5.63 JS/9.7WY SOPN, Inject 0.75 mg into the skin once a week., Disp: 2 mL, Rfl: 2   EPINEPHrine 0.3 mg/0.3 mL IJ SOAJ injection, Inject 0.3 mg into the muscle as needed., Disp: 1 each, Rfl: 1   famotidine (PEPCID) 20 MG tablet, Take 1 tablet by mouth 1-2 times a day as needed., Disp: 90 tablet, Rfl: 1   fluticasone (FLONASE) 50 MCG/ACT nasal spray, Use 1-2 sprays in each nostril once a day as needed for nasal congestion., Disp: 16 g, Rfl: 5   furosemide (LASIX) 80 MG tablet, Take 1 tablet (80 mg total) by mouth 2 (two) times daily., Disp: 180 tablet, Rfl: 3   gabapentin (NEURONTIN) 300 MG capsule, TAKE 1 CAPSULE BY MOUTH EVERY DAY AT BEDTIME, Disp: 90 capsule, Rfl: 1   gabapentin (NEURONTIN) 300 MG capsule, Take 1 capsule by mouth at bedtime, Disp: 90 capsule, Rfl: 1   glucose blood (ACCU-CHEK GUIDE) test strip, Use as instructed, Disp: 100 each, Rfl: 12   insulin aspart (NOVOLOG) 100 UNIT/ML injection, Inject 6 Units into the skin 3 (three) times daily with meals., Disp: 10 mL, Rfl: PRN   insulin glargine (LANTUS SOLOSTAR) 100 UNIT/ML Solostar Pen, Inject 24 Units into the skin daily., Disp: 15 mL, Rfl: 1   Insulin Pen Needle (PEN NEEDLES) 31G X 8 MM MISC, UAD, Disp: 100 each, Rfl: 6   Lancet Devices (ACCU-CHEK SOFTCLIX) lancets, 1 each by Other route 3 (three) times daily., Disp: 1 each, Rfl: 0   methocarbamol (ROBAXIN) 500 MG tablet, TAKE 1 TABLET BY MOUTH ONCE DAILY AS NEEDED FOR MUSCLE SPASM, Disp: 30 tablet, Rfl: 3   mometasone (ELOCON) 0.1 % cream, Apply 1 application topically daily., Disp: 45 g, Rfl: 1   potassium chloride (KLOR-CON) 10 MEQ tablet, Take 10 mEq by mouth daily., Disp: , Rfl:    promethazine-dextromethorphan (PROMETHAZINE-DM) 6.25-15 MG/5ML syrup, Take 5 mLs by mouth 4 (four) times daily as needed for cough., Disp: 180 mL, Rfl:  0   sacubitril-valsartan (ENTRESTO) 24-26 MG, Take 1 tablet by mouth 2 (two) times daily., Disp: 180 tablet, Rfl: 3   sildenafil (VIAGRA) 100 MG tablet, Take 1-2 tabs PO 1/2-1 hr prior to intercourse., Disp: 20 tablet, Rfl: 4   tamsulosin (FLOMAX) 0.4 MG CAPS capsule, Take 2 capsules (0.8 mg total) by mouth daily., Disp: 180 capsule, Rfl: 1   triamcinolone cream (KENALOG) 0.1 %, Apply sparingly to red itchy areas twice daily as needed. Avoid face, neck, armpits or groin area. Do not use more than 3 weeks in a row., Disp:  45 g, Rfl: 5

## 2022-03-17 ENCOUNTER — Telehealth: Payer: Self-pay | Admitting: Internal Medicine

## 2022-03-17 DIAGNOSIS — E1165 Type 2 diabetes mellitus with hyperglycemia: Secondary | ICD-10-CM

## 2022-03-17 MED ORDER — LANTUS SOLOSTAR 100 UNIT/ML ~~LOC~~ SOPN: 24.0000 [IU] | PEN_INJECTOR | Freq: Every day | SUBCUTANEOUS | 1 refills | Status: DC

## 2022-03-17 NOTE — Telephone Encounter (Signed)
Rx sent 

## 2022-03-17 NOTE — Telephone Encounter (Signed)
Medication Refill - Medication: insulin glargine (LANTUS SOLOSTAR) 100 UNIT/ML Solostar Pen   Pt says that PCP increased dosage for him, she increased it to 23 units instead of 20 so now he has ran out of his supply and insurance will not cover without a new prescription.   Has the patient contacted their pharmacy? Yes.   (Agent: If no, request that the patient contact the pharmacy for the refill. If patient does not wish to contact the pharmacy document the reason why and proceed with request.) (Agent: If yes, when and what did the pharmacy advise?)  Preferred Pharmacy (with phone number or street name):  [facility], [place] [address]  Phone: [phone] Fax: [phone]   Has the patient been seen for an appointment in the last year OR does the patient have an upcoming appointment? Yes.    Agent: Please be advised that RX refills may take up to 3 business days. We ask that you follow-up with your pharmacy.

## 2022-03-17 NOTE — Addendum Note (Signed)
Addended by: Daisy Blossom, Annie Main L on: 03/17/2022 05:15 PM   Modules accepted: Orders

## 2022-03-21 ENCOUNTER — Ambulatory Visit: Payer: Medicaid Other | Attending: Internal Medicine | Admitting: Pharmacist

## 2022-03-21 ENCOUNTER — Ambulatory Visit (INDEPENDENT_AMBULATORY_CARE_PROVIDER_SITE_OTHER): Payer: Medicaid Other

## 2022-03-21 ENCOUNTER — Encounter: Payer: Self-pay | Admitting: Pulmonary Disease

## 2022-03-21 ENCOUNTER — Ambulatory Visit (INDEPENDENT_AMBULATORY_CARE_PROVIDER_SITE_OTHER): Payer: Medicaid Other | Admitting: Pulmonary Disease

## 2022-03-21 ENCOUNTER — Encounter: Payer: Self-pay | Admitting: Pharmacist

## 2022-03-21 VITALS — BP 122/70 | HR 81 | Ht 72.0 in | Wt 259.0 lb

## 2022-03-21 DIAGNOSIS — J209 Acute bronchitis, unspecified: Secondary | ICD-10-CM

## 2022-03-21 DIAGNOSIS — E1165 Type 2 diabetes mellitus with hyperglycemia: Secondary | ICD-10-CM | POA: Diagnosis not present

## 2022-03-21 DIAGNOSIS — R0600 Dyspnea, unspecified: Secondary | ICD-10-CM | POA: Diagnosis not present

## 2022-03-21 DIAGNOSIS — Z794 Long term (current) use of insulin: Secondary | ICD-10-CM | POA: Diagnosis not present

## 2022-03-21 DIAGNOSIS — R051 Acute cough: Secondary | ICD-10-CM

## 2022-03-21 DIAGNOSIS — U071 COVID-19: Secondary | ICD-10-CM

## 2022-03-21 DIAGNOSIS — I5022 Chronic systolic (congestive) heart failure: Secondary | ICD-10-CM | POA: Diagnosis not present

## 2022-03-21 LAB — BRAIN NATRIURETIC PEPTIDE: Pro B Natriuretic peptide (BNP): 18 pg/mL (ref 0.0–100.0)

## 2022-03-21 MED ORDER — INSULIN ASPART 100 UNIT/ML IJ SOLN: 10.0000 [IU] | Freq: Three times a day (TID) | INTRAMUSCULAR | 99 refills | Status: DC

## 2022-03-21 MED ORDER — DOXYCYCLINE HYCLATE 100 MG PO TABS
100.0000 mg | ORAL_TABLET | Freq: Two times a day (BID) | ORAL | 0 refills | Status: DC
Start: 2022-03-21 — End: 2022-05-17

## 2022-03-21 MED ORDER — HYDROCOD POLI-CHLORPHE POLI ER 10-8 MG/5ML PO SUER: 5.0000 mL | Freq: Two times a day (BID) | ORAL | 0 refills | Status: DC | PRN

## 2022-03-21 MED ORDER — LANTUS SOLOSTAR 100 UNIT/ML ~~LOC~~ SOPN: 30.0000 [IU] | PEN_INJECTOR | Freq: Every day | SUBCUTANEOUS | 1 refills | Status: DC

## 2022-03-21 MED ORDER — IPRATROPIUM-ALBUTEROL 0.5-2.5 (3) MG/3ML IN SOLN: 3.0000 mL | Freq: Four times a day (QID) | RESPIRATORY_TRACT | 2 refills | Status: DC | PRN

## 2022-03-21 NOTE — Progress Notes (Signed)
S:     No chief complaint on file.  [name] is a 56 y.o. male who presents for diabetes evaluation, education, and management.  PMH is significant for of NICM, systolic CHF s/p ICD placement, DM type 2, HL, CKD stage 3,  obesity, BPH, GERD and chronic LBP secondary to spondylosis and spinal stenosis, COVID-19 infection..  Patient was referred and last seen by Primary Care Provider, Dr. Wynetta Emery, on 03/04/2022. At last visit, pt had hgome blood sugars in the 300s. He endorsed compliance with Lantus 15u daily, Novolog 4u TID and Trulicity. His Lantus was increased to 20u daily and Novolog to 6u TID with meals. He was also instructed to self-titrate if home blood sugar was above goal.   Today, patient arrives in good spirits and presents without any assistance.   Family/Social History:  -Fhx: DM, HTN -Tobacco: former smoker (quit in 2015) -Alcohol: none reported   Current diabetes medications include: dapagliflozin 5 mg daily, dulaglutide 0.75 mg weekly, Lantus 24 units daily, Novolog 6u TID (uses BID)  Patient reports adherence to taking all medications as prescribed. Of note, Lantus ran out 5 days ago.  Insurance coverage: [place] Medicaid  Patient denies hypoglycemic events.  Patient reports polyuria, polydipsia,  Patient reports neuropathy (nerve pain). Patient reports visual changes  O:   ROS  Physical Exam  7 day average blood glucose: 267  CGM data:  Average Glucose: 267 mg/dL. Averages are elevated between 12-6pm and then again 12a-6a. Most values are in the 250-300 range.    Lab Results  Component Value Date   HGBA1C 15.0 (A) 01/30/2022   There were no vitals filed for this visit.  Lipid Panel     Component Value Date/Time   CHOL 178 08/26/2021 1608   TRIG 336 (H) 08/26/2021 1608   HDL 42 08/26/2021 1608   CHOLHDL 4.2 08/26/2021 1608   CHOLHDL 4 05/08/2009 1040   VLDL 27.0 05/08/2009 1040   LDLCALC 81 08/26/2021 1608    Clinical Atherosclerotic  Cardiovascular Disease (ASCVD): No  The 10-year ASCVD risk score (Arnett DK, et al., 2019) is: 20.1%   Values used to calculate the score:     Age: 79 years     Sex: Male     Is Non-Hispanic African American: Yes     Diabetic: Yes     Tobacco smoker: No     Systolic Blood Pressure: 811 mmHg     Is BP treated: Yes     HDL Cholesterol: 42 mg/dL     Total Cholesterol: 178 mg/dL   Patient is participating in a Managed Medicaid Plan:  Yes   A/P: Diabetes longstanding currently uncontrolled. Patient is able to verbalize appropriate hypoglycemia management plan. Medication adherence appears appropriate. Control is suboptimal due to his URI. I think his infection is causing hyperglycemia and increasing his insulin requirement. His blood sugars at home are consistently above goal and he is symptomatic. Will titrate insulin for now.  -Increased dose of Lantus to 30u daily.  -Increased Novolog to 10u TID.  -Continue Trulicity 0.31 mg weekly. -Continue Farxiga for now as long as liquid intake is okay.  -Extensively discussed pathophysiology of diabetes, recommended lifestyle interventions, dietary effects on blood sugar control.  -Counseled on s/sx of and management of hypoglycemia.  -Next A1c anticipated 04/2022.   Written patient instructions provided. Patient verbalized understanding of treatment plan.  Total time in face to face counseling 20 minutes.    Follow-up:  Pharmacist prn. PCP clinic  visit next month.  Benard Halsted, PharmD, [facility] [place], [place] [phone]

## 2022-03-21 NOTE — Patient Instructions (Signed)
Asthmatic bronchitis with exacerbation: Doxycycline 100 mg twice a day x5 days Use DuoNeb as needed for chest tightness wheezing or shortness of breath, you can use this 3-4 times a day at home Chest x-ray today  Chronic systolic heart failure with increased dyspnea: BNP Chest x-ray  Cough, acute secondary to viral infection, refractory to over-the-counter medicines: Reviewed risks and benefits of narcotic treatment with patient, he does not have a history of alcohol abuse or substance abuse.  No record of recent narcotics in the [facility] registry. Tussionex every 12 hours as needed cough  We will order a lung function test to be performed when you come back on the next visit. Follow up 6 weeks me or NP

## 2022-03-24 ENCOUNTER — Ambulatory Visit (INDEPENDENT_AMBULATORY_CARE_PROVIDER_SITE_OTHER): Payer: Medicaid Other

## 2022-03-24 DIAGNOSIS — I5022 Chronic systolic (congestive) heart failure: Secondary | ICD-10-CM | POA: Diagnosis not present

## 2022-03-24 DIAGNOSIS — Z95810 Presence of automatic (implantable) cardiac defibrillator: Secondary | ICD-10-CM

## 2022-03-28 ENCOUNTER — Telehealth: Payer: Self-pay

## 2022-03-28 NOTE — Progress Notes (Signed)
EPIC Encounter for ICM Monitoring  Patient Name: [name] is a 56 y.o. male Date: 03/28/2022 Primary Care Physican: [name], MD Primary Cardiologist: Stanford Breed Electrophysiologist: Mealor 07/03/2021 Weight 258 lbs 09/11/2021 Weight: 262 lbs 10/21/2021 Weight: Not weighing at home 01/09/2022 Office Weight: 262 lbs                            Attempted call to patient and unable to reach.  Left detailed message per DPR regarding transmission. Transmission reviewed.    [place] thoracic impedance suggesting possible fluid accumulation starting 9/26 - 10/10.   Prescribed dosage:  Furosemide 80 mg take 1 tablet (80 mg total) twice a day.    Potassium 10 mEq take 1 tablet daily.    Labs: 02/01/2022 Creatinine 2.30, BUN 32, Potassium 3.4, Sodium 131, GFR 32 01/30/2022 A1C   15.0 01/09/2022 Creatinine 1.91, BUN 19, Potassium 4.1, Sodium 135 08/26/2021 Creatinine 2.08, BUN 20, Potassium 3.9, Sodium 137, GFR 37, A1C 13% Complete set of lab results in result review.    Recommendations:  Left voice mail with ICM number and encouraged to call if experiencing any fluid symptoms.   Follow-up plan: ICM clinic phone appointment on 05/05/2022.   91 day device clinic remote transmission 04/15/2022.     EP/Cardiology Office Visits: Recall 07/08/2022 with [name].  Recall 09/15/2021 with Oda Kilts, PA   Copy of ICM check sent to Dr Myles Gip.   3 month ICM trend: 03/24/2022.    12-14 Month ICM trend:     [name], RN 03/28/2022 3:51 PM

## 2022-03-28 NOTE — Telephone Encounter (Signed)
Remote ICM transmission received.  Attempted call to patient regarding ICM remote transmission and left detailed message per DPR.  Advised to return call for any fluid symptoms or questions. Next ICM remote transmission scheduled 05/05/2022.    

## 2022-04-04 ENCOUNTER — Other Ambulatory Visit: Payer: Self-pay

## 2022-04-04 ENCOUNTER — Encounter (HOSPITAL_COMMUNITY): Payer: Self-pay

## 2022-04-04 ENCOUNTER — Telehealth: Payer: Self-pay | Admitting: Internal Medicine

## 2022-04-04 ENCOUNTER — Emergency Department (HOSPITAL_COMMUNITY)
Admission: EM | Admit: 2022-04-04 | Discharge: 2022-04-05 | Disposition: A | Discharge status: Home / Self Care | Payer: Medicaid Other | Attending: Emergency Medicine | Admitting: Emergency Medicine

## 2022-04-04 ENCOUNTER — Encounter: Payer: Self-pay | Admitting: Internal Medicine

## 2022-04-04 ENCOUNTER — Emergency Department (HOSPITAL_COMMUNITY): Payer: Medicaid Other

## 2022-04-04 DIAGNOSIS — M79602 Pain in left arm: Secondary | ICD-10-CM | POA: Diagnosis not present

## 2022-04-04 DIAGNOSIS — Y92410 Unspecified street and highway as the place of occurrence of the external cause: Secondary | ICD-10-CM | POA: Insufficient documentation

## 2022-04-04 DIAGNOSIS — Z7982 Long term (current) use of aspirin: Secondary | ICD-10-CM | POA: Insufficient documentation

## 2022-04-04 DIAGNOSIS — S0990XA Unspecified injury of head, initial encounter: Secondary | ICD-10-CM | POA: Diagnosis not present

## 2022-04-04 DIAGNOSIS — R0789 Other chest pain: Secondary | ICD-10-CM | POA: Diagnosis not present

## 2022-04-04 DIAGNOSIS — M25512 Pain in left shoulder: Secondary | ICD-10-CM | POA: Diagnosis not present

## 2022-04-04 DIAGNOSIS — S20219A Contusion of unspecified front wall of thorax, initial encounter: Secondary | ICD-10-CM

## 2022-04-04 DIAGNOSIS — R002 Palpitations: Secondary | ICD-10-CM | POA: Diagnosis not present

## 2022-04-04 DIAGNOSIS — S40022A Contusion of left upper arm, initial encounter: Secondary | ICD-10-CM | POA: Diagnosis not present

## 2022-04-04 DIAGNOSIS — M542 Cervicalgia: Secondary | ICD-10-CM | POA: Insufficient documentation

## 2022-04-04 DIAGNOSIS — S199XXA Unspecified injury of neck, initial encounter: Secondary | ICD-10-CM | POA: Diagnosis not present

## 2022-04-04 DIAGNOSIS — Z794 Long term (current) use of insulin: Secondary | ICD-10-CM

## 2022-04-04 DIAGNOSIS — R079 Chest pain, unspecified: Secondary | ICD-10-CM | POA: Diagnosis not present

## 2022-04-04 DIAGNOSIS — S2020XA Contusion of thorax, unspecified, initial encounter: Secondary | ICD-10-CM | POA: Diagnosis not present

## 2022-04-04 DIAGNOSIS — Z79899 Other long term (current) drug therapy: Secondary | ICD-10-CM | POA: Insufficient documentation

## 2022-04-04 DIAGNOSIS — I10 Essential (primary) hypertension: Secondary | ICD-10-CM | POA: Insufficient documentation

## 2022-04-04 DIAGNOSIS — S20212A Contusion of left front wall of thorax, initial encounter: Secondary | ICD-10-CM | POA: Diagnosis not present

## 2022-04-04 DIAGNOSIS — S299XXA Unspecified injury of thorax, initial encounter: Secondary | ICD-10-CM | POA: Diagnosis present

## 2022-04-04 DIAGNOSIS — R519 Headache, unspecified: Secondary | ICD-10-CM | POA: Diagnosis not present

## 2022-04-04 DIAGNOSIS — E119 Type 2 diabetes mellitus without complications: Secondary | ICD-10-CM | POA: Insufficient documentation

## 2022-04-04 MED ORDER — INSULIN ASPART 100 UNIT/ML IJ SOLN: 10.0000 [IU] | Freq: Three times a day (TID) | INTRAMUSCULAR | 2 refills | Status: DC
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
## 2022-04-04 NOTE — Telephone Encounter (Signed)
Rx sent to [facility] on [address].

## 2022-04-04 NOTE — Telephone Encounter (Signed)
Pt is calling to report that the insurance will not pay for insulin aspart (NOVOLOG) 100 UNIT/ML injection [[id number]] at 10 87-87 2 times a day. Pharmacy is requesting a new script to be sent to [place]. CB- [phone]

## 2022-04-05 ENCOUNTER — Other Ambulatory Visit: Payer: Self-pay | Admitting: Internal Medicine

## 2022-04-05 DIAGNOSIS — E1165 Type 2 diabetes mellitus with hyperglycemia: Secondary | ICD-10-CM

## 2022-04-05 MED ORDER — NAPROXEN 500 MG PO TABS
500.0000 mg | ORAL_TABLET | Freq: Once | ORAL | Status: AC
  Administered 2022-04-05: 500 mg via ORAL
  Filled 2022-04-05: qty 1

## 2022-04-05 MED ORDER — NAPROXEN 500 MG PO TABS: 500.0000 mg | ORAL_TABLET | Freq: Two times a day (BID) | ORAL | 0 refills | Status: DC

## 2022-04-05 MED ORDER — INSULIN ASPART 100 UNIT/ML IJ SOLN: 10.0000 [IU] | Freq: Three times a day (TID) | INTRAMUSCULAR | 2 refills | Status: DC

## 2022-04-05 MED ORDER — METHOCARBAMOL 500 MG PO TABS
500.0000 mg | ORAL_TABLET | Freq: Once | ORAL | Status: AC
  Administered 2022-04-05: 500 mg via ORAL
  Filled 2022-04-05: qty 1

## 2022-04-05 NOTE — Discharge Instructions (Signed)
Alternate ice and heat to areas of injury 3-4 times per day to limit inflammation and spasm.  Avoid strenuous activity and heavy lifting.  Take Naproxen as prescribed for pain.  Increase your Robaxin to 1 tablet every 12 hours for the next 5-7 days, for pain control/muscle spasms.  Do not drive or drink alcohol after taking Robaxin as it may make you drowsy and impair your judgment.  We recommend follow-up with a primary care doctor to ensure resolution of symptoms.  Return to the ED for any new or concerning symptoms.

## 2022-04-06 ENCOUNTER — Other Ambulatory Visit: Payer: Self-pay | Admitting: Internal Medicine

## 2022-04-06 DIAGNOSIS — E1165 Type 2 diabetes mellitus with hyperglycemia: Secondary | ICD-10-CM

## 2022-04-07 ENCOUNTER — Other Ambulatory Visit: Payer: Self-pay

## 2022-04-07 ENCOUNTER — Telehealth: Payer: Self-pay | Admitting: Internal Medicine

## 2022-04-07 ENCOUNTER — Ambulatory Visit: Payer: Self-pay | Admitting: *Deleted

## 2022-04-07 ENCOUNTER — Telehealth: Payer: Self-pay

## 2022-04-07 ENCOUNTER — Other Ambulatory Visit: Payer: Self-pay | Admitting: Pharmacist

## 2022-04-07 ENCOUNTER — Encounter: Payer: Self-pay | Admitting: Internal Medicine

## 2022-04-07 MED ORDER — NOVOLOG FLEXPEN 100 UNIT/ML ~~LOC~~ SOPN: 10.0000 [IU] | PEN_INJECTOR | Freq: Two times a day (BID) | SUBCUTANEOUS | 2 refills | Status: DC
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
## 2022-04-07 NOTE — Telephone Encounter (Signed)
Patient states he is on the phone with a representative as we speak, scheduling an apt.
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
## 2022-04-07 NOTE — Telephone Encounter (Signed)
I replied to pt via Mychart.
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
## 2022-04-07 NOTE — Telephone Encounter (Signed)
First available appt is 05/08/22. Pt was transferred to NT.

## 2022-04-07 NOTE — Telephone Encounter (Signed)
Heather calling from [facility] is calling to request a PA for insulin aspart (NOVOLOG) 100 UNIT/ML injection [[id number]] Pt is stating that Dr. Wynetta Emery changed his dosage. [phone]

## 2022-04-08 ENCOUNTER — Ambulatory Visit: Payer: Medicaid Other | Admitting: Internal Medicine

## 2022-04-08 ENCOUNTER — Telehealth: Payer: Self-pay | Admitting: Emergency Medicine
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
## 2022-04-09 ENCOUNTER — Other Ambulatory Visit: Payer: Self-pay | Admitting: Internal Medicine

## 2022-04-09 DIAGNOSIS — E1142 Type 2 diabetes mellitus with diabetic polyneuropathy: Secondary | ICD-10-CM

## 2022-04-10 NOTE — Telephone Encounter (Signed)
Noted  

## 2022-04-15 ENCOUNTER — Ambulatory Visit (INDEPENDENT_AMBULATORY_CARE_PROVIDER_SITE_OTHER): Payer: Medicaid Other

## 2022-04-15 DIAGNOSIS — I428 Other cardiomyopathies: Secondary | ICD-10-CM | POA: Diagnosis not present

## 2022-04-15 LAB — CUP PACEART REMOTE DEVICE CHECK
Battery Remaining Longevity: 37 mo
Battery Remaining Percentage: 35 %
Battery Voltage: 2.89 V
Brady Statistic RV Percent Paced: 1 %
Date Time Interrogation Session: 20231107023621
HighPow Impedance: 64 Ohm
HighPow Impedance: 64 Ohm
Implantable Lead Connection Status: 753985
Implantable Lead Implant Date: 20150717
Implantable Lead Location: 753860
Implantable Pulse Generator Implant Date: 20150717
Lead Channel Impedance Value: 310 Ohm
Lead Channel Pacing Threshold Amplitude: 1 V
Lead Channel Pacing Threshold Pulse Width: 0.5 ms
Lead Channel Sensing Intrinsic Amplitude: 11.8 mV
Lead Channel Setting Pacing Amplitude: 2.5 V
Lead Channel Setting Pacing Pulse Width: 0.5 ms
Lead Channel Setting Sensing Sensitivity: 0.5 mV
Pulse Gen Serial Number: 7196009
Zone Setting Status: 755011

## 2022-04-17 ENCOUNTER — Other Ambulatory Visit: Payer: Self-pay | Admitting: Internal Medicine

## 2022-04-17 DIAGNOSIS — E1165 Type 2 diabetes mellitus with hyperglycemia: Secondary | ICD-10-CM

## 2022-04-18 NOTE — Telephone Encounter (Signed)
Noted  

## 2022-04-19 ENCOUNTER — Other Ambulatory Visit: Payer: Self-pay | Admitting: Cardiology

## 2022-04-19 ENCOUNTER — Encounter: Payer: Self-pay | Admitting: Cardiology

## 2022-04-19 DIAGNOSIS — I428 Other cardiomyopathies: Secondary | ICD-10-CM

## 2022-04-19 DIAGNOSIS — I519 Heart disease, unspecified: Secondary | ICD-10-CM

## 2022-04-19 DIAGNOSIS — I5042 Chronic combined systolic (congestive) and diastolic (congestive) heart failure: Secondary | ICD-10-CM

## 2022-04-21 MED ORDER — FUROSEMIDE 80 MG PO TABS: 80.0000 mg | ORAL_TABLET | Freq: Two times a day (BID) | ORAL | 1 refills | Status: DC

## 2022-04-21 MED ORDER — ENTRESTO 24-26 MG PO TABS: 1.0000 | ORAL_TABLET | Freq: Two times a day (BID) | ORAL | 1 refills | Status: DC

## 2022-04-25 ENCOUNTER — Other Ambulatory Visit: Payer: Self-pay | Admitting: Sports Medicine

## 2022-04-25 DIAGNOSIS — M7502 Adhesive capsulitis of left shoulder: Secondary | ICD-10-CM | POA: Diagnosis not present

## 2022-04-25 MED ORDER — METHOCARBAMOL 500 MG PO TABS: ORAL_TABLET | ORAL | 0 refills | Status: DC

## 2022-04-28 ENCOUNTER — Ambulatory Visit: Payer: Medicaid Other | Attending: Internal Medicine | Admitting: Pharmacist

## 2022-04-28 DIAGNOSIS — Z794 Long term (current) use of insulin: Secondary | ICD-10-CM | POA: Diagnosis not present

## 2022-04-28 DIAGNOSIS — E1165 Type 2 diabetes mellitus with hyperglycemia: Secondary | ICD-10-CM

## 2022-04-28 MED ORDER — TRULICITY 1.5 MG/0.5ML ~~LOC~~ SOAJ: 1.5000 mg | SUBCUTANEOUS | 3 refills | Status: DC

## 2022-04-28 NOTE — Progress Notes (Signed)
    S:    No chief complaint on file.  [name] is a 56 y.o. male who presents for diabetes evaluation, education, and management.  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 Patient was referred and last seen by Primary Care Provider, Dr. Wynetta Emery, on 03/04/2022. Last seen by pharmacy clinic on 03/21/2022.   At last visit, Lantus was increased from 24 to 30 units and Novolog was increased from 6 to 10 units BID (only eats 2 meals/day)  Today, patient arrives in good spirits and presents without any assistance. 7 day average on CGM has improved from 267 to 200 mg/dL. Believes his URI symptoms may be attributed to Long Covid.   Family/Social History:  -Fhx: DM, HTN -Tobacco: former smoker (quit in 2015) -Alcohol: none reported   Current diabetes medications include: dapagliflozin 5 mg daily, dulaglutide 0.75 mg weekly, Lantus 30 units daily, Novolog 10u TID (uses BID)  Patient reports adherence to taking all medications as prescribed.   Insurance coverage: [place] [employer]  Patient denies hypoglycemic events.  Patient reports polyuria, polydipsia,  Patient reports neuropathy (nerve pain). Patient reports visual changes  O:  CGM data:  7 day average blood glucose: 200 mg/dL  14 day average: 193 mg/dL   Lab Results  Component Value Date   HGBA1C 15.0 (A) 01/30/2022   Lipid Panel     Component Value Date/Time   CHOL 178 08/26/2021 1608   TRIG 336 (H) 08/26/2021 1608   HDL 42 08/26/2021 1608   CHOLHDL 4.2 08/26/2021 1608   CHOLHDL 4 05/08/2009 1040   VLDL 27.0 05/08/2009 1040   LDLCALC 81 08/26/2021 1608    Clinical Atherosclerotic Cardiovascular Disease (ASCVD): No  The 10-year ASCVD risk score (Arnett DK, et al., 2019) is: 22.4%   Values used to calculate the score:     Age: 54 years     Sex: Male     Is Non-Hispanic  African American: Yes     Diabetic: Yes     Tobacco smoker: No     Systolic Blood Pressure: 366 mmHg     Is BP treated: Yes     HDL Cholesterol: 42 mg/dL     Total Cholesterol: 178 mg/dL   Patient is participating in a Managed Medicaid Plan:  Yes   A/P: Diabetes longstanding currently uncontrolled. Patient is able to verbalize appropriate hypoglycemia management plan. Medication adherence appears appropriate.  -Continued Lantus 30 units daily.  -Continued Novolog 10u TID.  -Increase Trulicity to 1.5 mg weekly. -Continue Farxiga 5 mg daily.  -Extensively discussed pathophysiology of diabetes, recommended lifestyle interventions, dietary effects on blood sugar control.  -Counseled on s/sx of and management of hypoglycemia.  -Next A1c anticipated at next PCP visit.    Written patient instructions provided. Patient verbalized understanding of treatment plan.  Total time in face to face counseling 20 minutes.    Follow-up:  Pharmacist prn. PCP clinic visit next week.  Joseph Art, Pharm.D. PGY-2 Ambulatory Care Pharmacy Resident 04/28/2022 2:45 PM

## 2022-05-03 ENCOUNTER — Other Ambulatory Visit: Payer: Self-pay

## 2022-05-03 ENCOUNTER — Emergency Department (HOSPITAL_COMMUNITY): Payer: Medicaid Other

## 2022-05-03 ENCOUNTER — Encounter (HOSPITAL_COMMUNITY): Payer: Self-pay

## 2022-05-03 ENCOUNTER — Inpatient Hospital Stay (HOSPITAL_COMMUNITY)
Admission: EM | Admit: 2022-05-03 | Discharge: 2022-05-17 | DRG: 265 | Disposition: A | Discharge status: Home / Self Care | Payer: Medicaid Other | Attending: Family Medicine | Admitting: Family Medicine

## 2022-05-03 DIAGNOSIS — F064 Anxiety disorder due to known physiological condition: Secondary | ICD-10-CM | POA: Diagnosis not present

## 2022-05-03 DIAGNOSIS — I472 Ventricular tachycardia, unspecified: Secondary | ICD-10-CM | POA: Diagnosis not present

## 2022-05-03 DIAGNOSIS — J302 Other seasonal allergic rhinitis: Secondary | ICD-10-CM | POA: Diagnosis present

## 2022-05-03 DIAGNOSIS — K219 Gastro-esophageal reflux disease without esophagitis: Secondary | ICD-10-CM | POA: Diagnosis not present

## 2022-05-03 DIAGNOSIS — I130 Hypertensive heart and chronic kidney disease with heart failure and stage 1 through stage 4 chronic kidney disease, or unspecified chronic kidney disease: Secondary | ICD-10-CM | POA: Diagnosis not present

## 2022-05-03 DIAGNOSIS — Z91030 Bee allergy status: Secondary | ICD-10-CM

## 2022-05-03 DIAGNOSIS — E861 Hypovolemia: Secondary | ICD-10-CM | POA: Diagnosis not present

## 2022-05-03 DIAGNOSIS — I420 Dilated cardiomyopathy: Secondary | ICD-10-CM | POA: Diagnosis not present

## 2022-05-03 DIAGNOSIS — Z79899 Other long term (current) drug therapy: Secondary | ICD-10-CM

## 2022-05-03 DIAGNOSIS — L03317 Cellulitis of buttock: Secondary | ICD-10-CM | POA: Diagnosis not present

## 2022-05-03 DIAGNOSIS — N1832 Chronic kidney disease, stage 3b: Secondary | ICD-10-CM

## 2022-05-03 DIAGNOSIS — I469 Cardiac arrest, cause unspecified: Secondary | ICD-10-CM

## 2022-05-03 DIAGNOSIS — K611 Rectal abscess: Secondary | ICD-10-CM | POA: Diagnosis not present

## 2022-05-03 DIAGNOSIS — E669 Obesity, unspecified: Secondary | ICD-10-CM | POA: Diagnosis not present

## 2022-05-03 DIAGNOSIS — L0231 Cutaneous abscess of buttock: Secondary | ICD-10-CM

## 2022-05-03 DIAGNOSIS — D631 Anemia in chronic kidney disease: Secondary | ICD-10-CM | POA: Diagnosis present

## 2022-05-03 DIAGNOSIS — E876 Hypokalemia: Secondary | ICD-10-CM

## 2022-05-03 DIAGNOSIS — R000 Tachycardia, unspecified: Secondary | ICD-10-CM | POA: Diagnosis not present

## 2022-05-03 DIAGNOSIS — I5042 Chronic combined systolic (congestive) and diastolic (congestive) heart failure: Secondary | ICD-10-CM | POA: Diagnosis not present

## 2022-05-03 DIAGNOSIS — E875 Hyperkalemia: Secondary | ICD-10-CM | POA: Diagnosis not present

## 2022-05-03 DIAGNOSIS — I2489 Other forms of acute ischemic heart disease: Secondary | ICD-10-CM | POA: Diagnosis not present

## 2022-05-03 DIAGNOSIS — Z7985 Long-term (current) use of injectable non-insulin antidiabetic drugs: Secondary | ICD-10-CM

## 2022-05-03 DIAGNOSIS — N1831 Chronic kidney disease, stage 3a: Secondary | ICD-10-CM | POA: Diagnosis not present

## 2022-05-03 DIAGNOSIS — T402X5A Adverse effect of other opioids, initial encounter: Secondary | ICD-10-CM | POA: Diagnosis not present

## 2022-05-03 DIAGNOSIS — R739 Hyperglycemia, unspecified: Secondary | ICD-10-CM

## 2022-05-03 DIAGNOSIS — R001 Bradycardia, unspecified: Secondary | ICD-10-CM | POA: Diagnosis not present

## 2022-05-03 DIAGNOSIS — I5022 Chronic systolic (congestive) heart failure: Secondary | ICD-10-CM | POA: Diagnosis present

## 2022-05-03 DIAGNOSIS — E871 Hypo-osmolality and hyponatremia: Secondary | ICD-10-CM | POA: Diagnosis not present

## 2022-05-03 DIAGNOSIS — I428 Other cardiomyopathies: Secondary | ICD-10-CM | POA: Diagnosis present

## 2022-05-03 DIAGNOSIS — M6008 Infective myositis, other site: Secondary | ICD-10-CM | POA: Diagnosis not present

## 2022-05-03 DIAGNOSIS — Z7982 Long term (current) use of aspirin: Secondary | ICD-10-CM

## 2022-05-03 DIAGNOSIS — M10372 Gout due to renal impairment, left ankle and foot: Secondary | ICD-10-CM | POA: Diagnosis not present

## 2022-05-03 DIAGNOSIS — N400 Enlarged prostate without lower urinary tract symptoms: Secondary | ICD-10-CM | POA: Diagnosis present

## 2022-05-03 DIAGNOSIS — Z87442 Personal history of urinary calculi: Secondary | ICD-10-CM

## 2022-05-03 DIAGNOSIS — Z794 Long term (current) use of insulin: Secondary | ICD-10-CM | POA: Diagnosis not present

## 2022-05-03 DIAGNOSIS — E785 Hyperlipidemia, unspecified: Secondary | ICD-10-CM | POA: Diagnosis present

## 2022-05-03 DIAGNOSIS — I77810 Thoracic aortic ectasia: Secondary | ICD-10-CM | POA: Diagnosis present

## 2022-05-03 DIAGNOSIS — R002 Palpitations: Secondary | ICD-10-CM | POA: Diagnosis not present

## 2022-05-03 DIAGNOSIS — Z6835 Body mass index (BMI) 35.0-35.9, adult: Secondary | ICD-10-CM

## 2022-05-03 DIAGNOSIS — R197 Diarrhea, unspecified: Secondary | ICD-10-CM

## 2022-05-03 DIAGNOSIS — R0600 Dyspnea, unspecified: Secondary | ICD-10-CM | POA: Diagnosis not present

## 2022-05-03 DIAGNOSIS — Z87891 Personal history of nicotine dependence: Secondary | ICD-10-CM

## 2022-05-03 DIAGNOSIS — Z8616 Personal history of COVID-19: Secondary | ICD-10-CM | POA: Diagnosis not present

## 2022-05-03 DIAGNOSIS — M10361 Gout due to renal impairment, right knee: Secondary | ICD-10-CM

## 2022-05-03 DIAGNOSIS — M130 Polyarthritis, unspecified: Secondary | ICD-10-CM | POA: Diagnosis not present

## 2022-05-03 DIAGNOSIS — I491 Atrial premature depolarization: Secondary | ICD-10-CM | POA: Diagnosis not present

## 2022-05-03 DIAGNOSIS — N401 Enlarged prostate with lower urinary tract symptoms: Secondary | ICD-10-CM | POA: Diagnosis not present

## 2022-05-03 DIAGNOSIS — I519 Heart disease, unspecified: Secondary | ICD-10-CM

## 2022-05-03 DIAGNOSIS — N179 Acute kidney failure, unspecified: Secondary | ICD-10-CM | POA: Diagnosis not present

## 2022-05-03 DIAGNOSIS — E1122 Type 2 diabetes mellitus with diabetic chronic kidney disease: Secondary | ICD-10-CM | POA: Diagnosis present

## 2022-05-03 DIAGNOSIS — M1039 Gout due to renal impairment, multiple sites: Secondary | ICD-10-CM | POA: Diagnosis not present

## 2022-05-03 DIAGNOSIS — E1140 Type 2 diabetes mellitus with diabetic neuropathy, unspecified: Secondary | ICD-10-CM | POA: Diagnosis not present

## 2022-05-03 DIAGNOSIS — I2510 Atherosclerotic heart disease of native coronary artery without angina pectoris: Secondary | ICD-10-CM | POA: Diagnosis present

## 2022-05-03 DIAGNOSIS — M545 Low back pain, unspecified: Secondary | ICD-10-CM | POA: Diagnosis present

## 2022-05-03 DIAGNOSIS — R0789 Other chest pain: Secondary | ICD-10-CM | POA: Diagnosis not present

## 2022-05-03 DIAGNOSIS — J4489 Other specified chronic obstructive pulmonary disease: Secondary | ICD-10-CM | POA: Diagnosis present

## 2022-05-03 DIAGNOSIS — I462 Cardiac arrest due to underlying cardiac condition: Secondary | ICD-10-CM | POA: Diagnosis present

## 2022-05-03 DIAGNOSIS — Z95810 Presence of automatic (implantable) cardiac defibrillator: Secondary | ICD-10-CM

## 2022-05-03 DIAGNOSIS — G8929 Other chronic pain: Secondary | ICD-10-CM | POA: Diagnosis present

## 2022-05-03 DIAGNOSIS — E1165 Type 2 diabetes mellitus with hyperglycemia: Secondary | ICD-10-CM | POA: Diagnosis present

## 2022-05-03 DIAGNOSIS — E860 Dehydration: Secondary | ICD-10-CM | POA: Diagnosis present

## 2022-05-03 DIAGNOSIS — L509 Urticaria, unspecified: Secondary | ICD-10-CM | POA: Diagnosis present

## 2022-05-03 DIAGNOSIS — T68XXXA Hypothermia, initial encounter: Secondary | ICD-10-CM | POA: Diagnosis not present

## 2022-05-03 DIAGNOSIS — I9589 Other hypotension: Secondary | ICD-10-CM

## 2022-05-03 DIAGNOSIS — Z713 Dietary counseling and surveillance: Secondary | ICD-10-CM

## 2022-05-03 DIAGNOSIS — R079 Chest pain, unspecified: Secondary | ICD-10-CM | POA: Diagnosis not present

## 2022-05-03 DIAGNOSIS — I5023 Acute on chronic systolic (congestive) heart failure: Secondary | ICD-10-CM

## 2022-05-03 DIAGNOSIS — I4901 Ventricular fibrillation: Secondary | ICD-10-CM | POA: Diagnosis not present

## 2022-05-03 DIAGNOSIS — N138 Other obstructive and reflux uropathy: Secondary | ICD-10-CM | POA: Diagnosis present

## 2022-05-03 DIAGNOSIS — I5043 Acute on chronic combined systolic (congestive) and diastolic (congestive) heart failure: Secondary | ICD-10-CM

## 2022-05-03 DIAGNOSIS — Z9049 Acquired absence of other specified parts of digestive tract: Secondary | ICD-10-CM

## 2022-05-03 DIAGNOSIS — Z8249 Family history of ischemic heart disease and other diseases of the circulatory system: Secondary | ICD-10-CM

## 2022-05-03 DIAGNOSIS — R571 Hypovolemic shock: Secondary | ICD-10-CM | POA: Diagnosis present

## 2022-05-03 DIAGNOSIS — Z833 Family history of diabetes mellitus: Secondary | ICD-10-CM

## 2022-05-03 LAB — COMPREHENSIVE METABOLIC PANEL
ALT: 12 U/L (ref 0–44)
AST: 15 U/L (ref 15–41)
Albumin: 3.4 g/dL — ABNORMAL LOW (ref 3.5–5.0)
Alkaline Phosphatase: 58 U/L (ref 38–126)
Anion gap: 18 — ABNORMAL HIGH (ref 5–15)
BUN: 32 mg/dL — ABNORMAL HIGH (ref 6–20)
CO2: 21 mmol/L — ABNORMAL LOW (ref 22–32)
Calcium: 9 mg/dL (ref 8.9–10.3)
Chloride: 99 mmol/L (ref 98–111)
Creatinine, Ser: 2.84 mg/dL — ABNORMAL HIGH (ref 0.61–1.24)
GFR, Estimated: 25 mL/min — ABNORMAL LOW (ref >60)
Glucose, Bld: 244 mg/dL — ABNORMAL HIGH (ref 70–99)
Potassium: 2.8 mmol/L — ABNORMAL LOW (ref 3.5–5.1)
Sodium: 138 mmol/L (ref 135–145)
Total Bilirubin: 1.2 mg/dL (ref 0.3–1.2)
Total Protein: 7.6 g/dL (ref 6.5–8.1)

## 2022-05-03 LAB — CBC WITH DIFFERENTIAL/PLATELET
Abs Immature Granulocytes: 0 10*3/uL (ref 0.00–0.07)
Basophils Absolute: 0 10*3/uL (ref 0.0–0.1)
Basophils Relative: 0 %
Eosinophils Absolute: 0.2 10*3/uL (ref 0.0–0.5)
Eosinophils Relative: 1 %
HCT: 41.9 % (ref 39.0–52.0)
Hemoglobin: 14.1 g/dL (ref 13.0–17.0)
Lymphocytes Relative: 13 %
Lymphs Abs: 2.5 10*3/uL (ref 0.7–4.0)
MCH: 30.7 pg (ref 26.0–34.0)
MCHC: 33.7 g/dL (ref 30.0–36.0)
MCV: 91.1 fL (ref 80.0–100.0)
Monocytes Absolute: 1 10*3/uL (ref 0.1–1.0)
Monocytes Relative: 5 %
Neutro Abs: 15.8 10*3/uL — ABNORMAL HIGH (ref 1.7–7.7)
Neutrophils Relative %: 81 %
Platelets: 271 10*3/uL (ref 150–400)
RBC: 4.6 MIL/uL (ref 4.22–5.81)
RDW: 14.6 % (ref 11.5–15.5)
WBC: 19.5 10*3/uL — ABNORMAL HIGH (ref 4.0–10.5)
nRBC: 0 % (ref 0.0–0.2)
nRBC: 0 /100 WBC

## 2022-05-03 LAB — I-STAT CHEM 8, ED
BUN: 33 mg/dL — ABNORMAL HIGH (ref 6–20)
Calcium, Ion: 1.13 mmol/L — ABNORMAL LOW (ref 1.15–1.40)
Chloride: 101 mmol/L (ref 98–111)
Creatinine, Ser: 2.8 mg/dL — ABNORMAL HIGH (ref 0.61–1.24)
Glucose, Bld: 250 mg/dL — ABNORMAL HIGH (ref 70–99)
HCT: 44 % (ref 39.0–52.0)
Hemoglobin: 15 g/dL (ref 13.0–17.0)
Potassium: 2.8 mmol/L — ABNORMAL LOW (ref 3.5–5.1)
Sodium: 138 mmol/L (ref 135–145)
TCO2: 22 mmol/L (ref 22–32)

## 2022-05-03 LAB — CBC
HCT: 37.3 % — ABNORMAL LOW (ref 39.0–52.0)
Hemoglobin: 12.3 g/dL — ABNORMAL LOW (ref 13.0–17.0)
MCH: 30.2 pg (ref 26.0–34.0)
MCHC: 33 g/dL (ref 30.0–36.0)
MCV: 91.6 fL (ref 80.0–100.0)
Platelets: 246 K/uL (ref 150–400)
RBC: 4.07 MIL/uL — ABNORMAL LOW (ref 4.22–5.81)
RDW: 14.5 % (ref 11.5–15.5)
WBC: 16.1 K/uL — ABNORMAL HIGH (ref 4.0–10.5)
nRBC: 0 % (ref 0.0–0.2)

## 2022-05-03 LAB — TROPONIN I (HIGH SENSITIVITY)
Troponin I (High Sensitivity): 172 ng/L
Troponin I (High Sensitivity): 399 ng/L

## 2022-05-03 LAB — CREATININE, SERUM
Creatinine, Ser: 2.61 mg/dL — ABNORMAL HIGH (ref 0.61–1.24)
GFR, Estimated: 28 mL/min — ABNORMAL LOW (ref >60)

## 2022-05-03 LAB — MAGNESIUM: Magnesium: 2.6 mg/dL — ABNORMAL HIGH (ref 1.7–2.4)

## 2022-05-03 LAB — BRAIN NATRIURETIC PEPTIDE: B Natriuretic Peptide: 350.8 pg/mL — ABNORMAL HIGH (ref 0.0–100.0)

## 2022-05-03 LAB — HIV ANTIBODY (ROUTINE TESTING W REFLEX): HIV Screen 4th Generation wRfx: NONREACTIVE

## 2022-05-03 MED ORDER — POLYETHYLENE GLYCOL 3350 17 G PO PACK: 17.0000 g | PACK | Freq: Every day | ORAL | Status: DC | PRN

## 2022-05-03 MED ORDER — ONDANSETRON HCL 4 MG/2ML IJ SOLN
4.0000 mg | Freq: Once | INTRAMUSCULAR | Status: AC
  Administered 2022-05-03: 4 mg via INTRAVENOUS
  Filled 2022-05-03: qty 2

## 2022-05-03 MED ORDER — ATORVASTATIN CALCIUM 10 MG PO TABS
10.0000 mg | ORAL_TABLET | Freq: Every day | ORAL | Status: DC
  Administered 2022-05-03 – 2022-05-17 (×15): 10 mg via ORAL
  Filled 2022-05-03 (×16): qty 1

## 2022-05-03 MED ORDER — FENTANYL CITRATE PF 50 MCG/ML IJ SOSY
PREFILLED_SYRINGE | INTRAMUSCULAR | Status: AC
  Administered 2022-05-03: 50 ug via INTRAVENOUS
  Filled 2022-05-03: qty 1

## 2022-05-03 MED ORDER — HYDROMORPHONE HCL 1 MG/ML IJ SOLN
1.0000 mg | Freq: Once | INTRAMUSCULAR | Status: AC
  Administered 2022-05-03: 1 mg via INTRAVENOUS
  Filled 2022-05-03: qty 1

## 2022-05-03 MED ORDER — AMIODARONE HCL IN DEXTROSE 360-4.14 MG/200ML-% IV SOLN
INTRAVENOUS | Status: AC
  Administered 2022-05-03: 30 mg/h
  Filled 2022-05-03: qty 200

## 2022-05-03 MED ORDER — NOREPINEPHRINE 4 MG/250ML-% IV SOLN
INTRAVENOUS | Status: AC
  Administered 2022-05-03: 2 ug/min via INTRAVENOUS
  Filled 2022-05-03: qty 250

## 2022-05-03 MED ORDER — POTASSIUM CHLORIDE 10 MEQ/100ML IV SOLN
10.0000 meq | INTRAVENOUS | Status: AC
  Administered 2022-05-03 (×3): 10 meq via INTRAVENOUS
  Filled 2022-05-03 (×3): qty 100

## 2022-05-03 MED ORDER — IPRATROPIUM-ALBUTEROL 0.5-2.5 (3) MG/3ML IN SOLN: 3.0000 mL | Freq: Four times a day (QID) | RESPIRATORY_TRACT | Status: DC | PRN

## 2022-05-03 MED ORDER — DOCUSATE SODIUM 100 MG PO CAPS: 100.0000 mg | ORAL_CAPSULE | Freq: Two times a day (BID) | ORAL | Status: DC | PRN

## 2022-05-03 MED ORDER — ASPIRIN 81 MG PO TBEC
81.0000 mg | DELAYED_RELEASE_TABLET | Freq: Every day | ORAL | Status: DC
  Administered 2022-05-04 – 2022-05-13 (×10): 81 mg via ORAL
  Filled 2022-05-03 (×10): qty 1

## 2022-05-03 MED ORDER — AMIODARONE HCL IN DEXTROSE 360-4.14 MG/200ML-% IV SOLN
30.0000 mg/h | INTRAVENOUS | Status: DC
  Administered 2022-05-04 – 2022-05-06 (×6): 30 mg/h via INTRAVENOUS
  Filled 2022-05-03 (×4): qty 200

## 2022-05-03 MED ORDER — CHLORHEXIDINE GLUCONATE CLOTH 2 % EX PADS
6.0000 | MEDICATED_PAD | Freq: Every day | CUTANEOUS | Status: DC
  Administered 2022-05-03 – 2022-05-09 (×6): 6 via TOPICAL

## 2022-05-03 MED ORDER — LACTATED RINGERS IV BOLUS
500.0000 mL | Freq: Once | INTRAVENOUS | Status: AC
  Administered 2022-05-04: 500 mL via INTRAVENOUS

## 2022-05-03 MED ORDER — MAGNESIUM SULFATE 2 GM/50ML IV SOLN
2.0000 g | Freq: Once | INTRAVENOUS | Status: AC
  Administered 2022-05-03: 2 g via INTRAVENOUS
  Filled 2022-05-03: qty 50

## 2022-05-03 MED ORDER — FENTANYL CITRATE PF 50 MCG/ML IJ SOSY: 50.0000 ug | PREFILLED_SYRINGE | Freq: Once | INTRAMUSCULAR | Status: DC

## 2022-05-03 MED ORDER — SODIUM CHLORIDE 0.9 % IV SOLN: INTRAVENOUS | Status: DC

## 2022-05-03 MED ORDER — AMIODARONE HCL IN DEXTROSE 360-4.14 MG/200ML-% IV SOLN
60.0000 mg/h | INTRAVENOUS | Status: AC
  Administered 2022-05-04: 60 mg/h via INTRAVENOUS
  Filled 2022-05-03 (×2): qty 200

## 2022-05-03 MED ORDER — INSULIN ASPART 100 UNIT/ML IJ SOLN
0.0000 [IU] | INTRAMUSCULAR | Status: DC
  Administered 2022-05-03: 5 [IU] via SUBCUTANEOUS
  Administered 2022-05-04: 8 [IU] via SUBCUTANEOUS
  Administered 2022-05-04: 3 [IU] via SUBCUTANEOUS
  Administered 2022-05-04: 5 [IU] via SUBCUTANEOUS
  Administered 2022-05-04 (×2): 8 [IU] via SUBCUTANEOUS
  Administered 2022-05-04: 5 [IU] via SUBCUTANEOUS
  Administered 2022-05-05: 3 [IU] via SUBCUTANEOUS
  Administered 2022-05-05: 2 [IU] via SUBCUTANEOUS
  Administered 2022-05-05: 5 [IU] via SUBCUTANEOUS
  Administered 2022-05-05: 2 [IU] via SUBCUTANEOUS
  Administered 2022-05-06: 3 [IU] via SUBCUTANEOUS
  Administered 2022-05-06: 2 [IU] via SUBCUTANEOUS
  Administered 2022-05-06: 3 [IU] via SUBCUTANEOUS
  Administered 2022-05-06: 2 [IU] via SUBCUTANEOUS
  Administered 2022-05-06: 3 [IU] via SUBCUTANEOUS
  Administered 2022-05-07 (×2): 2 [IU] via SUBCUTANEOUS
  Administered 2022-05-07: 3 [IU] via SUBCUTANEOUS
  Administered 2022-05-07: 5 [IU] via SUBCUTANEOUS
  Administered 2022-05-07: 3 [IU] via SUBCUTANEOUS
  Administered 2022-05-07: 2 [IU] via SUBCUTANEOUS
  Administered 2022-05-08: 5 [IU] via SUBCUTANEOUS
  Administered 2022-05-08 (×2): 3 [IU] via SUBCUTANEOUS
  Administered 2022-05-08 – 2022-05-09 (×3): 5 [IU] via SUBCUTANEOUS
  Administered 2022-05-09: 2 [IU] via SUBCUTANEOUS

## 2022-05-03 MED ORDER — HEPARIN SODIUM (PORCINE) 5000 UNIT/ML IJ SOLN
5000.0000 [IU] | Freq: Three times a day (TID) | INTRAMUSCULAR | Status: DC
  Administered 2022-05-03 – 2022-05-12 (×25): 5000 [IU] via SUBCUTANEOUS
  Filled 2022-05-03 (×25): qty 1

## 2022-05-03 MED ORDER — SODIUM CHLORIDE 0.9 % IV BOLUS
500.0000 mL | Freq: Once | INTRAVENOUS | Status: AC
  Administered 2022-05-03: 500 mL via INTRAVENOUS

## 2022-05-03 MED ORDER — SODIUM CHLORIDE 0.9 % IV SOLN: 250.0000 mL | INTRAVENOUS | Status: DC

## 2022-05-03 MED ORDER — LACTATED RINGERS IV BOLUS
500.0000 mL | Freq: Once | INTRAVENOUS | Status: AC
  Administered 2022-05-03: 500 mL via INTRAVENOUS

## 2022-05-03 MED ORDER — NOREPINEPHRINE 4 MG/250ML-% IV SOLN
2.0000 ug/min | INTRAVENOUS | Status: DC
  Administered 2022-05-04: 3 ug/min via INTRAVENOUS
  Administered 2022-05-04: 5 ug/min via INTRAVENOUS
  Filled 2022-05-03 (×2): qty 250

## 2022-05-03 MED ORDER — LEVALBUTEROL HCL 0.63 MG/3ML IN NEBU: 0.6300 mg | INHALATION_SOLUTION | Freq: Four times a day (QID) | RESPIRATORY_TRACT | Status: DC | PRN

## 2022-05-03 MED ORDER — DAPAGLIFLOZIN PROPANEDIOL 5 MG PO TABS: 5.0000 mg | ORAL_TABLET | Freq: Every day | ORAL | Status: DC

## 2022-05-03 MED ORDER — AMIODARONE IV BOLUS ONLY 150 MG/100ML
INTRAVENOUS | Status: AC
  Administered 2022-05-03: 150 mg
  Filled 2022-05-03: qty 100

## 2022-05-03 NOTE — ED Notes (Signed)
CPR started for about 30 seconds with MD at bedside. Patient became responsive.

## 2022-05-03 NOTE — ED Notes (Signed)
Unsuccessful attempt to interrogate patients defibrillator. [employer] defibrillator device read "can't read information." MD made aware.

## 2022-05-03 NOTE — H&P (Signed)
NAME:  [name], MRN:  [id number], DOB:  08-26-1965, LOS: 0 ADMISSION DATE:  05/03/2022, CONSULTATION DATE:  11/25 REFERRING MD:  Kommor, CHIEF COMPLAINT:  Diarrhea   History of Present Illness:  [name] is an 56 y.o. M who presented to the [facility] ED via EMS with a chief complaint of diarrhea and ICD shocks.  Patient dose of trulicity increased on 02/58. Reports since increase having diarrhea daily. Denies nausea, vomiting, BRBPR, melena. Patient reportedly with ICD shock on AM on 11/25. Unable to take in PO on 11/25. EMS called.  In the [facility] ED he had a witnessed VTACH arrest with CPR and ROSC achieved with one shock (30 seconds). Return to baseline neurological status. K 2.8, Creat 2.84 (baseline 1.8-2.3), WBC 19.5. Tmax 99.1. Troponin 172. He was given diludid for chest pain post CPR. He developed hypotension (74/40). Cardiology consulted.   PCCM consulted for admission.  Pertinent  Medical History  NICM, Systolic CHF s/p ICD placement, DM2, CKD stage 3, obesity, BPH, GERD, chronic LBP secondary to spondylosis and spinal stenosis.  Significant Hospital Events: Including procedures, antibiotic start and stop dates in addition to other pertinent events   11/25 Presented to [facility]  Interim History / Subjective:  See above  Subjective: drowsy, endorses chest pain  Objective   Blood pressure 107/64, pulse 99, temperature 99.1 F (37.3 C), temperature source Oral, resp. rate 19, height 6' (1.829 m), weight 112.9 kg, SpO2 97 %.        Intake/Output Summary (Last 24 hours) at 05/03/2022 2051 Last data filed at 05/03/2022 2029 Gross per 24 hour  Intake 100.83 ml  Output --  Net 100.83 ml   Filed Weights   05/03/22 2007  Weight: 112.9 kg    Examination: General: In bed, NAD, well nourished  HEENT: MM pink/dry, anicteric, atraumatic Neuro: RASS -2, PERRL 45m, MAE, oriented to situation, place, name CV: S1S2, NSR, no m/r/g appreciated PULM:  air movement in all  lobes, trachea midline, chest expansion symmetric GI: soft, bsx4 hypoactive, non-tender   Extremities: warm/dry, no pretibial edema, capillary refill less than 3 seconds  Skin:  no rashes or lesions noted  Labs/Imaging K 2.8 CO2 21, AG 18 Glucose 244 Creat 2.84, BUN 32 WBC 19.5 Troponin 172, BNP pending CXR: no pneumo, no pleural effusion, no pulmonary edema, no infiltrate, enlarged cardiac sihouette 12 lead: [facility]     Assessment & Plan:  [facility] secondary to hypokalemia and hypovolemia from diarrhea Hypokalemia, secondary to diarrhea Troponin elevation, suspect secondary to demand Hypotension, suspect secondary to dilaudid administration on hypovolemia HX NICM, Systolic CHF s/p ICD placement Patient dose of trulicity increased on 152/77 Reports since increase having diarrhea daily. Denies nausea, vomiting, BRBPR, melena. Patient reportedly with ICD shock on AM on 11/25. Witnessed Vtach in ED, 30 second CPR with one shock. K 2.8. Troponin 172. ECHO 08/22 LVEF 30% -Admit to ICU with continious telemetry and SPO2 monitoring -Replete K, recheck post repletion -Goal MAP 60-65. 5068mNS given, give additional 50049mR. Patient appears dry, give additional fluid boluses if needed for hypotension. -Cardiology consulted. Appreciate assistance.  -Continue amio infusion -Follow up ECHO  Diarrhea Patient dose of trulicity increased on 11/82/42eports since increase having diarrhea daily. Denies nausea, vomiting, BRBPR, melena. Afebrile. WBC 19.5. Suspect reactive. Abdominal exam without tenderness. LFT WNL -Follow fever/wbc curve -Monitor output and abdominal exam  DM2 -Blood Glucose goal 140-180. -SSI -Not tolerating  PO at this time and hypokalemic. Resume long acting on 11/25  ?AKI on CKD stage 3  Creat 2.84 (baseline 1.8-2.3), No PO intake on 11/25. About 1 week of diarrhea. -Ensure renal perfusion. Goal MAP 65 or  greater. -Avoid neprotoxic drugs as possible. -Strict I&O's -Follow up AM creatinine  GERD -PPI    Best Practice (right click and "Reselect all SmartList Selections" daily)   Diet/type: NPO w/ oral meds DVT prophylaxis: prophylactic heparin  GI prophylaxis: PPI Lines: N/A Foley:  N/A Code Status:  full code Last date of multidisciplinary goals of care discussion [Full scope]  Labs   CBC: Recent Labs  Lab 05/03/22 2010 05/03/22 2025  WBC 19.5*  --   NEUTROABS PENDING  --   HGB 14.1 15.0  HCT 41.9 44.0  MCV 91.1  --   PLT 271  --     Basic Metabolic Panel: Recent Labs  Lab 05/03/22 2025  NA 138  K 2.8*  CL 101  GLUCOSE 250*  BUN 33*  CREATININE 2.80*   GFR: Estimated Creatinine Clearance: 37.8 mL/min (A) (by C-G formula based on SCr of 2.8 mg/dL (H)). Recent Labs  Lab 05/03/22 2010  WBC 19.5*    Liver Function Tests: No results for input(s): "AST", "ALT", "ALKPHOS", "BILITOT", "PROT", "ALBUMIN" in the last 168 hours. No results for input(s): "LIPASE", "AMYLASE" in the last 168 hours. No results for input(s): "AMMONIA" in the last 168 hours.  ABG    Component Value Date/Time   PHART 7.410 05/08/2016 1432   PCO2ART 38.6 05/08/2016 1432   PO2ART 91.2 05/08/2016 1432   HCO3 27.7 02/01/2022 1704   TCO2 22 05/03/2022 2025   ACIDBASEDEF 0.1 05/08/2016 1432   O2SAT 87 02/01/2022 1704     Coagulation Profile: No results for input(s): "INR", "PROTIME" in the last 168 hours.  Cardiac Enzymes: No results for input(s): "CKTOTAL", "CKMB", "CKMBINDEX", "TROPONINI" in the last 168 hours.  HbA1C: HbA1c, POC (controlled diabetic range)  Date/Time Value Ref Range Status  01/30/2022 04:59 PM 15.0 (A) 0.0 - 7.0 % Final    Comment:    Greater than 15.0  03/19/2021 04:09 PM 8.9 (A) 0.0 - 7.0 % Final   Hgb A1c MFr Bld  Date/Time Value Ref Range Status  08/26/2021 04:08 PM 13.0 (H) 4.8 - 5.6 % Final    Comment:             Prediabetes: 5.7 - 6.4           Diabetes: >6.4          Glycemic control for adults with diabetes: <7.0     CBG: No results for input(s): "GLUCAP" in the last 168 hours.  Review of Systems:   Positives in bold  Gen: fever, chills, weight change, fatigue, night sweats HEENT:  blurred vision, double vision, hearing loss, tinnitus, sinus congestion, rhinorrhea, sore throat, neck stiffness, dysphagia PULM:  shortness of breath, cough, sputum production, hemoptysis, wheezing CV: chest pain, edema, orthopnea, paroxysmal nocturnal dyspnea, palpitations GI:  abdominal pain, nausea, vomiting, diarrhea, hematochezia, melena, constipation, change in bowel habits GU: dysuria, hematuria, polyuria, oliguria, urethral discharge Endocrine: hot or cold intolerance, polyuria, polyphagia or appetite change Derm: rash, dry skin, scaling or peeling skin change Heme: easy bruising, bleeding, bleeding gums Neuro: headache, numbness, weakness, slurred speech, loss of memory or consciousness   Past Medical History:  He,  has a past medical history of AICD (automatic cardioverter/defibrillator) present, Chronic bronchitis ([place]), Chronic systolic (congestive) heart failure ([place]),  Fracture of left humerus, GERD (gastroesophageal reflux disease), Heart murmur, History of renal calculi, Hypertension, Kidney stones, NICM (nonischemic cardiomyopathy) ([place]), Obesity, 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   Surgical History:   Past Surgical History:  Procedure Laterality Date   CARDIAC CATHETERIZATION  08/10/13   CHOLECYSTECTOMY  ~ 2010   IMPLANTABLE CARDIOVERTER DEFIBRILLATOR IMPLANT  12/23/2013   STJ Fortify ICD implanted by [name] for cardiomyopathy and syncope   IMPLANTABLE CARDIOVERTER DEFIBRILLATOR IMPLANT N/A 12/23/2013   Procedure: IMPLANTABLE CARDIOVERTER DEFIBRILLATOR  IMPLANT;  Surgeon: Coralyn Mark, MD;  Location: [facility] CATH LAB;  Service: Cardiovascular;  Laterality: N/A;   INGUINAL HERNIA REPAIR Left 03/01/2018   Procedure: OPEN REPAIR OF LEFT INGUINAL HERNIA WITH MESH;  Surgeon: Greer Pickerel, MD;  Location: [facility] ORS;  Service: General;  Laterality: Left;   LEFT HEART CATHETERIZATION WITH CORONARY ANGIOGRAM N/A 08/10/2013   Procedure: LEFT HEART CATHETERIZATION WITH CORONARY ANGIOGRAM;  Surgeon: Jettie Booze, MD;  Location: [facility] CATH LAB;  Service: Cardiovascular;  Laterality: N/A;   ORIF HUMERUS FRACTURE Left 08/12/2013   Procedure: OPEN REDUCTION INTERNAL FIXATION (ORIF) HUMERAL SHAFT FRACTURE;  Surgeon: Newt Minion, MD;  Location: [facility];  Service: Orthopedics;  Laterality: Left;  Open Reduction Internal Fixation Left Humerus   URETEROSCOPY     "laser for kidney stones"     Social History:   reports that he quit smoking about 7 years ago. His smoking use included cigarettes. He has a 28.00 pack-year smoking history. He has never used smokeless tobacco. He reports current alcohol use. He reports current drug use. Drug: Marijuana.   Family History:  His family history includes Arthritis in his father and mother; Diabetes in his father and mother; Hypertension in his brother, father, and mother.   Allergies Allergies  Allergen Reactions   Bee Venom Anaphylaxis     Home Medications  Prior to Admission medications   Medication Sig Start Date End Date Taking? Authorizing Provider  Accu-Chek Softclix Lancets lancets Use as instructed 02/01/22   Cristie Hem, MD  albuterol (VENTOLIN HFA) 108 (90 Base) MCG/ACT inhaler Inhale into the lungs every 6 (six) hours as needed for wheezing or shortness of breath.    [provider]  aspirin 81 MG tablet Take 81 mg by mouth daily.    [provider]  atorvastatin (LIPITOR) 10 MG tablet Take 1 tablet (10 mg total) by mouth daily. 01/19/22   Elsie Stain, MD  Blood Glucose Monitoring  Suppl (ACCU-CHEK GUIDE) w/Device KIT UAD 02/01/22   Cristie Hem, MD  carvedilol (COREG) 6.25 MG tablet Take 1 tablet (6.25 mg total) by mouth 2 (two) times daily. 09/19/21   Lelon Perla, MD  cetirizine (ZYRTEC) 10 MG tablet Take 1 tablet (10 mg total) by mouth daily. 01/30/22   Valentina Shaggy, MD  chlorpheniramine-HYDROcodone (TUSSIONEX) 10-8 MG/5ML Take 5 mLs by mouth every 12 (twelve) hours as needed for cough. 03/21/22   Juanito Doom, MD  clobetasol ointment (TEMOVATE) 1.61 % Apply 1 Application topically 2 (two) times daily. 01/30/22   Valentina Shaggy, MD  Continuous Blood Gluc Receiver (FREESTYLE LIBRE 2 READER) DEVI Use to check blood sugar TID.  E11.65 03/06/22   [name], MD  Continuous Blood Gluc Sensor (FREESTYLE LIBRE 2 SENSOR) MISC Use to check blood sugar TID. Change sensor once every 14  days. E11.65 03/06/22   [name], MD  dapagliflozin propanediol (FARXIGA) 5 MG TABS tablet Take 1 tablet (5 mg total) by mouth daily before breakfast. 01/19/22   Elsie Stain, MD  doxycycline (VIBRA-TABS) 100 MG tablet Take 1 tablet (100 mg total) by mouth 2 (two) times daily. 03/21/22   Juanito Doom, MD  Dulaglutide (TRULICITY) 1.5 NI/6.2VO SOPN Inject 1.5 mg into the skin once a week. 04/28/22   [name], MD  EPINEPHrine 0.3 mg/0.3 mL IJ SOAJ injection Inject 0.3 mg into the muscle as needed. 01/30/22   Valentina Shaggy, MD  famotidine (PEPCID) 20 MG tablet Take 1 tablet by mouth 1-2 times a day as needed. 02/18/22   Valentina Shaggy, MD  fluticasone [facility]) 50 MCG/ACT nasal spray Use 1-2 sprays in each nostril once a day as needed for nasal congestion. 01/30/22   Valentina Shaggy, MD  furosemide (LASIX) 80 MG tablet Take 1 tablet (80 mg total) by mouth 2 (two) times daily. 04/21/22   Lelon Perla, MD  gabapentin (NEURONTIN) 300 MG capsule TAKE 1 CAPSULE BY MOUTH EVERY DAY AT BEDTIME 10/05/20   [name], MD   gabapentin (NEURONTIN) 300 MG capsule Take 1 capsule by mouth at bedtime 04/09/22   [name], MD  glucose blood (ACCU-CHEK GUIDE) test strip Use as instructed 02/01/22   Cristie Hem, MD  insulin aspart (NOVOLOG FLEXPEN) 100 UNIT/ML FlexPen Inject 10 Units into the skin 2 (two) times daily before a meal. 04/07/22   [name], MD  insulin glargine (LANTUS SOLOSTAR) 100 UNIT/ML Solostar Pen Inject 30 Units into the skin daily. 03/21/22   [name], MD  Insulin Pen Needle (PEN NEEDLES) 31G X 8 MM MISC UAD 03/01/22   [name], MD  ipratropium-albuterol (DUONEB) 0.5-2.5 (3) MG/3ML SOLN Take 3 mLs by nebulization every 6 (six) hours as needed. 03/21/22   Juanito Doom, MD  Lancet Devices [facility]) lancets 1 each by Other route 3 (three) times daily. 10/20/14   Funches, Adriana Mccallum, MD  methocarbamol (ROBAXIN) 500 MG tablet TAKE 1 TABLET BY MOUTH ONCE DAILY AS NEEDED FOR MUSCLE SPASM 04/25/22   Rodena Goldmann A, DO  mometasone (ELOCON) 0.1 % cream Apply 1 application topically daily. 06/17/19   Marzetta Board, DPM  naproxen (NAPROSYN) 500 MG tablet Take 1 tablet (500 mg total) by mouth 2 (two) times daily with a meal. 04/05/22   Antonietta Breach, PA-C  potassium chloride (KLOR-CON) 10 MEQ tablet Take 10 mEq by mouth daily. 11/02/20   [provider]  promethazine-dextromethorphan (PROMETHAZINE-DM) 6.25-15 MG/5ML syrup Take 5 mLs by mouth 4 (four) times daily as needed for cough. 02/18/22   Elsie Stain, MD  sacubitril-valsartan (ENTRESTO) 24-26 MG Take 1 tablet by mouth 2 (two) times daily. 04/21/22   Lelon Perla, MD  sildenafil (VIAGRA) 100 MG tablet Take 1-2 tabs PO 1/2-1 hr prior to intercourse. 12/14/20   [name], MD  tamsulosin (FLOMAX) 0.4 MG CAPS capsule Take 2 capsules (0.8 mg total) by mouth daily. 01/19/22   Elsie Stain, MD  triamcinolone cream (KENALOG) 0.1 % Apply sparingly to red itchy areas twice daily as  needed. Avoid face, neck, armpits or groin area. Do not use more than 3 weeks in a row. 01/30/22   Valentina Shaggy, MD     Critical care time: 35 minutes    The patient is critically ill with multiple organ  systems failure and requires high complexity decision making for assessment and support, frequent evaluation and titration of therapies, application of advanced monitoring technologies and extensive interpretation of multiple databases.    Critical Care Time devoted to patient care services described in this note is 35 minutes. This time reflects time of care of this [place] NP. This critical care time does not reflect procedure time but could involve care discussion time with the PCCM attending.  Redmond School., MSN, APRN, AGACNP-BC [facility]  05/03/2022 , 9:36 PM  Please see [email or web address] for pager details  If no response, please call [phone] After hours, please call Elink at [phone]

## 2022-05-03 NOTE — Consult Note (Addendum)
Cardiology Consultation:   Patient ID: [name] MRN: [id number]; DOB: 11-30-1965  Admit date: 05/03/2022 Date of Consult: 05/03/2022  Primary Care Provider: [name], MD [facility] Cardiologist: [name], MD  [facility] Electrophysiologist:  Thompson Grayer, MD    Patient Profile:   [name] is a 56 y.o. male with a hx of NICM/chronic systolic CHF s/p ICD, normal coronary arteries by cath 2015, GERD, HTN and DM2 who is being seen today for the evaluation of ventricular tachycardia/Vfib with multiple ICD shocks at the request of Dr. Matilde Sprang in [place].  History of Present Illness:   [name] s a 56 y.o. male with a hx of NICM/chronic systolic CHF s/p ICD, normal coronary arteries by cath 2015, GERD, HTN and DM2.  He was initially dx with DCM by echo 07/2013 with EF 20-25% with global HK, biatrial enlargement and mild MR.  Cath 08/2013 showed normal coronary arteries.  His is s/p AICD remotely followed by Dr. Rayann Heman in the past in EP.  01/2016 CPX showed moderate functional impairment circulatory limited.  Echo 8/22 with EF 30% and severe LVE, G1DD, mild MR and mildy dilated aortic root at 13m.  He was last seen by Dr. CStanford Breedin 01/2022 and was doing well and no changes were made to his meds.   He was in his USOH until 11/20 when he saw his PCP who doubled his Trulicity.  He then developed nausea and diarrhea that was severe and liquid.  He had no nausea or abdominal pain.  This am he had a shock from his ICD that he says was the first shock he ever had.  After that he had several more shocks and called EMS.  He had 7 additional shocks from EMS and with rhythm strips showing Vtach but also what appeared to be PMVT.  In ER he had a witnessed VT arrest with CPR and ROSC with 1 shock.  Labs showed K+ 2.8, SCr 2.84 (baseline 1.8-2.3), WBC 19.5 and hsTrop 172.  He also had low grade fever 99.1.  He was given Dilaudid for CP from multiple shocks after CPR and BP dropped to  74/425mg.  Cardiology was asked to consult    He tells me that he had been fine up until earlier this week when is DM med was changed.  He has not had any chest pain or pressure until after he had the shocks and CPR.  He has chronic DOE that has not changed recently.  He denied nay LE edema.  Currently in NSR on tele.  EKG showed ST at 110bpm with no acute ST changes, nonspecific IVCD, anterior infarct age undetermined and QTc 44327m He says that he has been compliant with all his meds and has not missed any doses.   Past Medical History:  Diagnosis Date   AICD (automatic cardioverter/defibrillator) present    [place]itis [place]  "get it most q yr" (12/23/2013)   Chronic systolic (congestive) heart failure ([place]  Fracture of left humerus    a. 07/2013.   GERD (gastroesophageal reflux disease)    not at present time   Heart murmur    "born w/it"    History of renal calculi    Hypertension    Kidney stones    NIC[place] (nonischemic cardiomyopathy) ([place]  a. 07/2013 Echo: EF 20-25%, diff HK, Gr2 DD, mild MR, mod dil LA/RA. 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    Syncope    Type II diabetes mellitus ([place]) 2006    Past Surgical History:  Procedure Laterality Date   CARDIAC CATHETERIZATION  08/10/13   CHOLECYSTECTOMY  ~ 2010   IMPLANTABLE CARDIOVERTER DEFIBRILLATOR IMPLANT  12/23/2013   STJ Fortify ICD implanted by [name] for cardiomyopathy and syncope   IMPLANTABLE CARDIOVERTER DEFIBRILLATOR IMPLANT N/A 12/23/2013   Procedure: IMPLANTABLE CARDIOVERTER DEFIBRILLATOR IMPLANT;  Surgeon: Coralyn Mark, MD;  Location: [facility] CATH LAB;  Service: Cardiovascular;  Laterality: N/A;   INGUINAL HERNIA REPAIR Left 03/01/2018   Procedure: OPEN REPAIR OF LEFT INGUINAL HERNIA WITH MESH;  Surgeon: Greer Pickerel, MD;  Location: [facility] ORS;  Service:  General;  Laterality: Left;   LEFT HEART CATHETERIZATION WITH CORONARY ANGIOGRAM N/A 08/10/2013   Procedure: LEFT HEART CATHETERIZATION WITH CORONARY ANGIOGRAM;  Surgeon: Jettie Booze, MD;  Location: [facility] CATH LAB;  Service: Cardiovascular;  Laterality: N/A;   ORIF HUMERUS FRACTURE Left 08/12/2013   Procedure: OPEN REDUCTION INTERNAL FIXATION (ORIF) HUMERAL SHAFT FRACTURE;  Surgeon: Newt Minion, MD;  Location: [place];  Service: Orthopedics;  Laterality: Left;  Open Reduction Internal Fixation Left Humerus   URETEROSCOPY     "laser for kidney stones"     Home Medications:  Prior to Admission medications   Medication Sig Start Date End Date Taking? Authorizing Provider  albuterol (VENTOLIN HFA) 108 (90 Base) MCG/ACT inhaler Inhale into the lungs every 6 (six) hours as needed for wheezing or shortness of breath.   Yes [provider]  aspirin 81 MG tablet Take 81 mg by mouth daily.   Yes [provider]  atorvastatin (LIPITOR) 10 MG tablet Take 1 tablet (10 mg total) by mouth daily. 01/19/22  Yes Elsie Stain, MD  carvedilol (COREG) 6.25 MG tablet Take 1 tablet (6.25 mg total) by mouth 2 (two) times daily. 09/19/21  Yes Lelon Perla, MD  cetirizine (ZYRTEC) 10 MG tablet Take 1 tablet (10 mg total) by mouth daily. 01/30/22  Yes Valentina Shaggy, MD  dapagliflozin propanediol (FARXIGA) 5 MG TABS tablet Take 1 tablet (5 mg total) by mouth daily before breakfast. 01/19/22  Yes Elsie Stain, MD  Dulaglutide (TRULICITY) 1.5 IZ/1.2WP SOPN Inject 1.5 mg into the skin once a week. 04/28/22  Yes [name], MD  furosemide (LASIX) 80 MG tablet Take 1 tablet (80 mg total) by mouth 2 (two) times daily. 04/21/22  Yes Lelon Perla, MD  gabapentin (NEURONTIN) 300 MG capsule TAKE 1 CAPSULE BY MOUTH EVERY DAY AT BEDTIME Patient taking differently: Take 300 mg by mouth at bedtime. 10/05/20  Yes [name], MD  insulin aspart (NOVOLOG FLEXPEN) 100 UNIT/ML  FlexPen Inject 10 Units into the skin 2 (two) times daily before a meal. 04/07/22  Yes [name], MD  insulin glargine (LANTUS SOLOSTAR) 100 UNIT/ML Solostar Pen Inject 30 Units into the skin daily. 03/21/22  Yes [name], MD  ipratropium-albuterol (DUONEB) 0.5-2.5 (3) MG/3ML SOLN Take 3 mLs by nebulization every 6 (six) hours as needed. Patient taking differently: Take 3 mLs by nebulization every 6 (six) hours as needed (for wheezing or shortness of breath). 03/21/22  Yes Juanito Doom, MD  potassium chloride (KLOR-CON) 10 MEQ tablet Take 10 mEq by mouth daily. 11/02/20  Yes [provider]  sacubitril-valsartan (ENTRESTO) 24-26 MG Take 1 tablet  by mouth 2 (two) times daily. 04/21/22  Yes Lelon Perla, MD  tamsulosin (FLOMAX) 0.4 MG CAPS capsule Take 2 capsules (0.8 mg total) by mouth daily. 01/19/22  Yes Elsie Stain, MD  Accu-Chek Softclix Lancets lancets Use as instructed 02/01/22   Cristie Hem, MD  Blood Glucose Monitoring Suppl (ACCU-CHEK GUIDE) w/Device KIT UAD 02/01/22   Cristie Hem, MD  chlorpheniramine-HYDROcodone (TUSSIONEX) 10-8 MG/5ML Take 5 mLs by mouth every 12 (twelve) hours as needed for cough. 03/21/22   Juanito Doom, MD  clobetasol ointment (TEMOVATE) 0.37 % Apply 1 Application topically 2 (two) times daily. 01/30/22   Valentina Shaggy, MD  Continuous Blood Gluc Receiver (FREESTYLE LIBRE 2 READER) DEVI Use to check blood sugar TID.  E11.65 03/06/22   [name], MD  Continuous Blood Gluc Sensor (FREESTYLE LIBRE 2 SENSOR) MISC Use to check blood sugar TID. Change sensor once every 14 days. E11.65 03/06/22   [name], MD  doxycycline (VIBRA-TABS) 100 MG tablet Take 1 tablet (100 mg total) by mouth 2 (two) times daily. Patient not taking: Reported on 05/03/2022 03/21/22   Simonne Maffucci B, MD  EPINEPHrine 0.3 mg/0.3 mL IJ SOAJ injection Inject 0.3 mg into the muscle as needed. 01/30/22   Valentina Shaggy, MD  famotidine (PEPCID) 20 MG tablet Take 1 tablet by mouth 1-2 times a day as needed. Patient taking differently: Take 20 mg by mouth See admin instructions. Take 1 tablet by mouth 1-2 times a day as needed for heartburn or indigestion 02/18/22   Valentina Shaggy, MD  fluticasone [facility]) 50 MCG/ACT nasal spray Use 1-2 sprays in each nostril once a day as needed for nasal congestion. 01/30/22   Valentina Shaggy, MD  glucose blood (ACCU-CHEK GUIDE) test strip Use as instructed 02/01/22   Cristie Hem, MD  Insulin Pen Needle (PEN NEEDLES) 31G X 8 MM MISC UAD 03/01/22   [name], MD  Lancet Devices [facility]) lancets 1 each by Other route 3 (three) times daily. 10/20/14   Funches, Adriana Mccallum, MD  methocarbamol (ROBAXIN) 500 MG tablet TAKE 1 TABLET BY MOUTH ONCE DAILY AS NEEDED FOR MUSCLE SPASM Patient taking differently: Take 500 mg by mouth daily as needed for muscle spasms. 04/25/22   Rodena Goldmann A, DO  mometasone (ELOCON) 0.1 % cream Apply 1 application topically daily. 06/17/19   Marzetta Board, DPM  naproxen (NAPROSYN) 500 MG tablet Take 1 tablet (500 mg total) by mouth 2 (two) times daily with a meal. 04/05/22   Antonietta Breach, PA-C  promethazine-dextromethorphan (PROMETHAZINE-DM) 6.25-15 MG/5ML syrup Take 5 mLs by mouth 4 (four) times daily as needed for cough. 02/18/22   Elsie Stain, MD  sildenafil (VIAGRA) 100 MG tablet Take 1-2 tabs PO 1/2-1 hr prior to intercourse. 12/14/20   [name], MD  triamcinolone cream (KENALOG) 0.1 % Apply sparingly to red itchy areas twice daily as needed. Avoid face, neck, armpits or groin area. Do not use more than 3 weeks in a row. 01/30/22   Valentina Shaggy, MD    Inpatient Medications: Scheduled Meds:  [START ON 05/04/2022] aspirin EC  81 mg Oral Daily   atorvastatin  10 mg Oral Daily   heparin  5,000 Units Subcutaneous Q8H   Continuous Infusions:  sodium chloride     norepinephrine  (LEVOPHED) Adult infusion 8 mcg/min (05/03/22 2154)   potassium chloride Stopped (05/03/22 2143)   PRN Meds: levalbuterol, polyethylene glycol  Allergies:    Allergies  Allergen Reactions   Bee Venom Anaphylaxis    Social History:   Social History   Socioeconomic History   Marital status: Single    Spouse name: Not on file   Number of children: Not on file   Years of education: Not on file   Highest education level: Not on file  Occupational History   Occupation: Disabled  Tobacco Use   Smoking status: Former    Packs/day: 1.00    Years: 28.00    Total pack years: 28.00    Types: Cigarettes    Quit date: 06/03/2014    Years since quitting: 7.9   Smokeless tobacco: Never  Vaping Use   Vaping Use: Never used  Substance and Sexual Activity   Alcohol use: Yes   Drug use: Yes    Types: Marijuana    Comment: occasionally   Sexual activity: Yes  Other Topics Concern   Not on file  Social History Narrative   Pt lives with his brother    Social Determinants of Health   Financial Resource Strain: Not on file  Food Insecurity: No Food Insecurity (12/11/2020)   Hunger Vital Sign    Worried About Running Out of Food in the Last Year: Never true    Ran Out of Food in the Last Year: Never true  Transportation Needs: No Transportation Needs (03/12/2021)   PRAPARE - Hydrologist (Medical): No    Lack of Transportation (Non-Medical): No  Physical Activity: Not on file  Stress: Not on file  Social Connections: Moderately Integrated (03/12/2021)   Social Connection and Isolation Panel [NHANES]    Frequency of Communication with Friends and Family: More than three times a week    Frequency of Social Gatherings with Friends and Family: More than three times a week    Attends Religious Services: More than 4 times per year    Active Member of [employer] or Organizations: Yes    Attends Archivist Meetings: Never    Marital Status: Never married   Human resources officer Violence: Not on file    Family History:    Family History  Problem Relation Age of Onset   Diabetes Father    Arthritis Father    Hypertension Father    Diabetes Mother    Arthritis Mother    Hypertension Mother    Hypertension Brother      ROS:  Please see the history of present illness.   All other ROS reviewed and negative.     Physical Exam/Data:   Vitals:   05/03/22 2154 05/03/22 2200 05/03/22 2200 05/03/22 2215  BP: (!) 89/71 104/63 (!) 95/59 (!) 102/59  Pulse: 96 95  92  Resp: (!) 26 (!) 21  (!) 24  Temp:      TempSrc:      SpO2: 96% 96%  97%  Weight:      Height:        Intake/Output Summary (Last 24 hours) at 05/03/2022 2232 Last data filed at 05/03/2022 2136 Gross per 24 hour  Intake 915.16 ml  Output --  Net 915.16 ml      05/03/2022    8:07 PM 04/04/2022   11:18 PM 03/21/2022    2:12 PM  Last 3 Weights  Weight (lbs) 249 lb 260 lb 2.3 oz 259 lb  Weight (kg) 112.946 kg 118 kg 117.482 kg     Body mass index is 33.77 kg/m.  General:  Well nourished, well developed, in no acute distress HEENT: normal Lymph: no adenopathy Neck: no JVD Endocrine:  No thryomegaly Vascular: No carotid bruits; FA pulses 2+ bilaterally without bruits  Cardiac:  normal S1, S2; RRR; no murmur  Lungs:  clear to auscultation bilaterally, no wheezing, rhonchi or rales  Abd: soft, nontender, no hepatomegaly  Ext: no edema Musculoskeletal:  No deformities, BUE and BLE strength normal and equal Skin: warm and dry  Neuro:  CNs 2-12 intact, no focal abnormalities noted Psych:  Normal affect   EKG:  The EKG was personally reviewed and demonstrates:  NSR with nonspecific IVCD, anterior infarct age undetermined with episode of PMVT Telemetry:  Telemetry was personally reviewed and demonstrates:  NSR  Relevant CV Studies: 2D echo 02/01/2021 IMPRESSIONS    1. Left ventricular ejection fraction, by estimation, is 30%. The left  ventricle has moderately  decreased function. The left ventricle  demonstrates global hypokinesis. The left ventricular internal cavity size  was severely dilated. There is mild left  ventricular hypertrophy. Left ventricular diastolic parameters are  consistent with Grade I diastolic dysfunction (impaired relaxation).   2. Right ventricular systolic function is normal. The right ventricular  size is normal.   3. The mitral valve is normal in structure. Mild mitral valve  regurgitation.   4. The aortic valve is tricuspid. Aortic valve regurgitation is not  visualized.   5. Aortic dilatation noted. There is mild dilatation of the aortic root,  measuring 41 mm.   Comparison(s): The left ventricular function is worsened.    Laboratory Data:  High Sensitivity Troponin:   Recent Labs  Lab 05/03/22 2010  TROPONINIHS 172*     Chemistry Recent Labs  Lab 05/03/22 2010 05/03/22 2025  NA 138 138  K 2.8* 2.8*  CL 99 101  CO2 21*  --   GLUCOSE 244* 250*  BUN 32* 33*  CREATININE 2.84* 2.80*  CALCIUM 9.0  --   GFRNONAA 25*  --   ANIONGAP 18*  --     Recent Labs  Lab 05/03/22 2010  PROT 7.6  ALBUMIN 3.4*  AST 15  ALT 12  ALKPHOS 58  BILITOT 1.2   Hematology Recent Labs  Lab 05/03/22 2010 05/03/22 2025 05/03/22 2147  WBC 19.5*  --  16.1*  RBC 4.60  --  4.07*  HGB 14.1 15.0 12.3*  HCT 41.9 44.0 37.3*  MCV 91.1  --  91.6  MCH 30.7  --  30.2  MCHC 33.7  --  33.0  RDW 14.6  --  14.5  PLT 271  --  246   BNP Recent Labs  Lab 05/03/22 2010  BNP 350.8*    DDimer No results for input(s): "DDIMER" in the last 168 hours.   Radiology/Studies:  DG Chest Portable 1 View  Result Date: 05/03/2022 CLINICAL DATA:  dyspnea EXAM: PORTABLE CHEST 1 VIEW COMPARISON:  Chest x-ray 03/21/2022, CT chest 05/08/2004 FINDINGS: Left chest wall single lead cardiac defibrillator in grossly appropriate position. Stable enlarged cardiac silhouette. The heart and mediastinal contours are enlarged. Low lung volumes.  No focal consolidation. No pulmonary edema. No pleural effusion. No pneumothorax. No acute osseous abnormality. IMPRESSION: Cardiomegaly with no active disease. Electronically Signed   By: Iven Finn M.D.   On: 05/03/2022 20:53     Assessment and Plan:   VT cardiac arrest -patient presented with VT storm in setting of acute diarrhea and severe hypokalemia and multiple ICD shocks for VT/VF -K+2.8 secondary to severe nausea and diarrhea  for several days after increased dose of Trulicity -he has not had any cardiac problems up until this hospitalization and denies any recent CP other that chest discomfort associated with his CPR and ICD shocks and his baseline DOE is unchanged.  He has not missed any of his cardiac meds -suspect that his VT is related to hypokalemia in setting of NICM -repleting potassium and giving 2gm Mag as well -supp k+ to keep > 4 and mag > 2 -continue IV amio gtt with Amio bolus as needed for any further VT -check 2D echo in am to reassess LVF -BB on hold due to hypotension  2.  NIDCM/Chronic combined systolic /diastolicCHF -s/p remote AICD -EF 30% on echo a year ago -he does not appear volume overloaded on exam today -hold home does of carvedilol, Farxiga, Lasix and Entresto for now due to hypotension from cardiac arrest and diarrhea and worsening renal function  3.  Hypotension -related to diarrhea, Dilaudid and cardiac arrest -continue Levo gtt -continue IVF resuscitation but cautiously in setting of LV dysfunction -holding BB, ARNi, diuretics and SGLT2i  4.  AKI -related to hypovolemia and hypotension -gentle fluids -see #3  5.  DM -per CCM    Performed by: Fransico Him, MD   Total critical care time: 60 minutes   Critical care time was exclusive of separately billable procedures and treating other patients.   Critical care was necessary to treat or prevent imminent or life-threatening deterioration.   Critical care was time spent personally  by me (independent of APPs or residents) on the following activities: development of treatment plan with patient and/or surrogate as well as nursing, discussions with consultants, evaluation of patient's response to treatment, examination of patient, obtaining history from patient or surrogate, ordering and performing treatments and interventions, ordering and review of laboratory studies, ordering and review of radiographic studies, pulse oximetry and re-evaluation of patient's condition.  For questions or updates, please contact [place] Please consult [email or web address] for contact info under    Signed, Fransico Him, MD  05/03/2022 10:32 PM

## 2022-05-03 NOTE — ED Notes (Signed)
Report called to Miles, RN at this time.

## 2022-05-03 NOTE — Progress Notes (Signed)
eLink Physician-Brief Progress Note Patient Name: [name] DOB: [birth year] MRN: [id number]   Date of Service  05/03/2022  HPI/Events of Note  56/M with non-ischemic cardiomyopathy (LVEF 30%), s/p AICD, presenting to the ED after feeling his AICD discharge. While in the ED, he went into VT cardiac arrest. Subsequent workup revealed AKI with hypokalemia. He was started on amiodarone, potassium repletion, and admitted to the ICU.  eICU Interventions  - Admit to ICU - Presentlt awake, alert. Maintaining airway.  - Maintain on continuous cardiac monitoring  - Continue amiodarone.  - On levophed presently. Titrate to target MAP>65 - Monitor electrolytes, address abnormalities as warranted.  - Continue IVF. Monitor I/Os - Renally dose medications, avoid nephrotoxins.  - Glucose control        [name] 05/03/2022, 10:46 PM  3:38 AM Pt had an episode of Vtach, lasted less than 1 minute. He was shocked by his AICD.  Pt back into sinus tachycardia.  Amiodarone already running.  Will follow due labs and address electrolyte abnormalities as warranted.  5:39 AM Pt had a second bout of Vtach, again lasting less than 1 minute. Pt was awake during the episode. He had not felt the AICD go off.  BMP still pending. Will give another dose of amiodarone 150mg  IV in the meantime.

## 2022-05-04 DIAGNOSIS — I469 Cardiac arrest, cause unspecified: Secondary | ICD-10-CM | POA: Diagnosis not present

## 2022-05-04 DIAGNOSIS — N179 Acute kidney failure, unspecified: Secondary | ICD-10-CM | POA: Diagnosis not present

## 2022-05-04 DIAGNOSIS — L0231 Cutaneous abscess of buttock: Secondary | ICD-10-CM | POA: Diagnosis not present

## 2022-05-04 DIAGNOSIS — E861 Hypovolemia: Secondary | ICD-10-CM | POA: Diagnosis not present

## 2022-05-04 DIAGNOSIS — E876 Hypokalemia: Secondary | ICD-10-CM | POA: Diagnosis not present

## 2022-05-04 DIAGNOSIS — I9589 Other hypotension: Secondary | ICD-10-CM | POA: Diagnosis not present

## 2022-05-04 DIAGNOSIS — K611 Rectal abscess: Secondary | ICD-10-CM | POA: Diagnosis not present

## 2022-05-04 DIAGNOSIS — I472 Ventricular tachycardia, unspecified: Secondary | ICD-10-CM | POA: Diagnosis not present

## 2022-05-04 LAB — BASIC METABOLIC PANEL
Anion gap: 15 (ref 5–15)
Anion gap: 15 (ref 5–15)
Anion gap: 15 (ref 5–15)
BUN: 35 mg/dL — ABNORMAL HIGH (ref 6–20)
BUN: 40 mg/dL — ABNORMAL HIGH (ref 6–20)
BUN: 40 mg/dL — ABNORMAL HIGH (ref 6–20)
CO2: 20 mmol/L — ABNORMAL LOW (ref 22–32)
CO2: 21 mmol/L — ABNORMAL LOW (ref 22–32)
CO2: 20 mmol/L — ABNORMAL LOW (ref 22–32)
Calcium: 8.7 mg/dL — ABNORMAL LOW (ref 8.9–10.3)
Calcium: 8.2 mg/dL — ABNORMAL LOW (ref 8.9–10.3)
Calcium: 8.2 mg/dL — ABNORMAL LOW (ref 8.9–10.3)
Chloride: 102 mmol/L (ref 98–111)
Chloride: 95 mmol/L — ABNORMAL LOW (ref 98–111)
Chloride: 97 mmol/L — ABNORMAL LOW (ref 98–111)
Creatinine, Ser: 2.62 mg/dL — ABNORMAL HIGH (ref 0.61–1.24)
Creatinine, Ser: 3.04 mg/dL — ABNORMAL HIGH (ref 0.61–1.24)
Creatinine, Ser: 2.97 mg/dL — ABNORMAL HIGH (ref 0.61–1.24)
GFR, Estimated: 28 mL/min — ABNORMAL LOW (ref >60)
GFR, Estimated: 23 mL/min — ABNORMAL LOW (ref >60)
GFR, Estimated: 24 mL/min — ABNORMAL LOW (ref >60)
Glucose, Bld: 267 mg/dL — ABNORMAL HIGH (ref 70–99)
Glucose, Bld: 313 mg/dL — ABNORMAL HIGH (ref 70–99)
Glucose, Bld: 229 mg/dL — ABNORMAL HIGH (ref 70–99)
Potassium: 3.3 mmol/L — ABNORMAL LOW (ref 3.5–5.1)
Potassium: 3.2 mmol/L — ABNORMAL LOW (ref 3.5–5.1)
Potassium: 3.1 mmol/L — ABNORMAL LOW (ref 3.5–5.1)
Sodium: 137 mmol/L (ref 135–145)
Sodium: 131 mmol/L — ABNORMAL LOW (ref 135–145)
Sodium: 132 mmol/L — ABNORMAL LOW (ref 135–145)

## 2022-05-04 LAB — CBC
HCT: 35.5 % — ABNORMAL LOW (ref 39.0–52.0)
Hemoglobin: 11.6 g/dL — ABNORMAL LOW (ref 13.0–17.0)
MCH: 30.7 pg (ref 26.0–34.0)
MCHC: 32.7 g/dL (ref 30.0–36.0)
MCV: 93.9 fL (ref 80.0–100.0)
Platelets: 199 10*3/uL (ref 150–400)
RBC: 3.78 MIL/uL — ABNORMAL LOW (ref 4.22–5.81)
RDW: 14.6 % (ref 11.5–15.5)
WBC: 16.8 10*3/uL — ABNORMAL HIGH (ref 4.0–10.5)
nRBC: 0 % (ref 0.0–0.2)

## 2022-05-04 LAB — MRSA NEXT GEN BY PCR, NASAL: MRSA by PCR Next Gen: NOT DETECTED

## 2022-05-04 LAB — GLUCOSE, CAPILLARY
Glucose-Capillary: 175 mg/dL — ABNORMAL HIGH (ref 70–99)
Glucose-Capillary: 240 mg/dL — ABNORMAL HIGH (ref 70–99)
Glucose-Capillary: 242 mg/dL — ABNORMAL HIGH (ref 70–99)
Glucose-Capillary: 264 mg/dL — ABNORMAL HIGH (ref 70–99)
Glucose-Capillary: 293 mg/dL — ABNORMAL HIGH (ref 70–99)
Glucose-Capillary: 297 mg/dL — ABNORMAL HIGH (ref 70–99)

## 2022-05-04 LAB — TROPONIN I (HIGH SENSITIVITY): Troponin I (High Sensitivity): 553 ng/L (ref <18)

## 2022-05-04 LAB — POTASSIUM: Potassium: 3.1 mmol/L — ABNORMAL LOW (ref 3.5–5.1)

## 2022-05-04 LAB — PHOSPHORUS
Phosphorus: 2.1 mg/dL — ABNORMAL LOW (ref 2.5–4.6)
Phosphorus: 2 mg/dL — ABNORMAL LOW (ref 2.5–4.6)

## 2022-05-04 LAB — MAGNESIUM
Magnesium: 2.4 mg/dL (ref 1.7–2.4)
Magnesium: 2.4 mg/dL (ref 1.7–2.4)

## 2022-05-04 MED ORDER — AMIODARONE LOAD VIA INFUSION
150.0000 mg | Freq: Once | INTRAVENOUS | Status: AC
  Administered 2022-05-04: 150 mg via INTRAVENOUS
  Filled 2022-05-04: qty 83.34

## 2022-05-04 MED ORDER — POTASSIUM PHOSPHATES 15 MMOLE/5ML IV SOLN: 20.0000 mmol | Freq: Once | INTRAVENOUS | Status: DC

## 2022-05-04 MED ORDER — POTASSIUM PHOSPHATES 15 MMOLE/5ML IV SOLN
15.0000 mmol | Freq: Once | INTRAVENOUS | Status: AC
  Administered 2022-05-04: 15 mmol via INTRAVENOUS
  Filled 2022-05-04: qty 5

## 2022-05-04 MED ORDER — GERHARDT'S BUTT CREAM
TOPICAL_CREAM | Freq: Two times a day (BID) | CUTANEOUS | Status: DC | PRN
  Administered 2022-05-04: 1 via TOPICAL
  Filled 2022-05-04: qty 1

## 2022-05-04 MED ORDER — HYDROMORPHONE HCL 2 MG PO TABS
2.0000 mg | ORAL_TABLET | ORAL | Status: DC | PRN
  Administered 2022-05-04: 2 mg via ORAL
  Filled 2022-05-04: qty 1

## 2022-05-04 MED ORDER — AMIODARONE IV BOLUS ONLY 150 MG/100ML: 150.0000 mg | Freq: Once | INTRAVENOUS | Status: DC

## 2022-05-04 MED ORDER — POTASSIUM CHLORIDE CRYS ER 20 MEQ PO TBCR
40.0000 meq | EXTENDED_RELEASE_TABLET | Freq: Once | ORAL | Status: AC
  Administered 2022-05-04: 40 meq via ORAL
  Filled 2022-05-04: qty 2

## 2022-05-04 MED ORDER — ORAL CARE MOUTH RINSE: 15.0000 mL | OROMUCOSAL | Status: DC | PRN

## 2022-05-04 MED ORDER — INSULIN DETEMIR 100 UNIT/ML ~~LOC~~ SOLN
10.0000 [IU] | Freq: Two times a day (BID) | SUBCUTANEOUS | Status: DC
  Administered 2022-05-04 – 2022-05-10 (×13): 10 [IU] via SUBCUTANEOUS
  Filled 2022-05-04 (×15): qty 0.1

## 2022-05-04 NOTE — Progress Notes (Addendum)
eLink Physician-Brief Progress Note Patient Name: [name] DOB: 12/05/1965 MRN: [id number]   Date of Service  05/04/2022  HPI/Events of Note  Notified at 930 pm of labs that were done around 7 pm. D/w RN. Patient got 40 meq oral K at 5 pm, then got 15 mmol of K phos which finished at 8 pm so the 7 pm labs do not reflect the levels.   eICU Interventions  I ordered stat BMP and phos to be done I then got contacted by RN to let me know around 9.30 pm that patient is refusing to have labs drawn and Lab tech has been unable to draw earlier as well due to several repeated sticks which were needed. Patient needs frequent lab draws for ongoing repletion so I suggested we try to place an art line if possible so labs can be drawn. He is on 5 mic/min of levophed infusion.      Intervention Category Major Interventions: Electrolyte abnormality - evaluation and management  [name] [name] 05/04/2022, 9:36 PM  Addendum at 11:20 pm  Labs have just resulted  K and phos dropped despite repletion  15 mmol K phos and 40 meq oral K x 1 ordered Recheck labs in AM   Addendum at 12:35 am  Anxiety and asking for something to help calm him. Low dose xanax x 1 ordered .

## 2022-05-04 NOTE — ED Provider Notes (Signed)
[facility] CARDIOVASCULAR ICU Provider Note  CSN: [id number] Arrival date & time: 05/03/22 1959  Chief Complaint(s) Chest Pain (Pt has been having chest pain all day, BIB by EMS for V tach, N/V/D since last night, internal defibrillator started shocking patient tonight, 7 times shocked by internal defibrillator with EMS)  HPI WILLIE PLAIN is a 56 y.o. male with PMH nonischemic cardiomyopathy with EF 25 to 30% with AICD in place, T2DM, HTN who presents emergency department for evaluation of chest pain after receiving multiple AICD shocks.  Patient reportedly had an increased dosing on his Trulicity which she did not tolerate very well and had multiple episodes of diarrhea over the last 24 hours.  This morning, he felt a significant pain in his chest consistent with an AICD shock and per the patient's brother, then went back to sleep.  This occurred a few more times over the course of the day and ultimately he was brought to the emergency department for further evaluation.  With EMS, the patient was shocked multiple additional times for rhythms that clearly show the patient was in V. tach per EMS rhythm strips.  On arrival, patient is ill-appearing and diaphoretic currently being shocked by his AICD.  He is unable to provide further history.   Past Medical History Past Medical History:  Diagnosis Date   AICD (automatic cardioverter/defibrillator) present    Chronic bronchitis ([place])    "get it most q yr" (12/23/2013)   Chronic systolic (congestive) heart failure ([place])    Fracture of left humerus    a. 07/2013.   GERD (gastroesophageal reflux disease)    not at present time   Heart murmur    "born w/it"    History of renal calculi    Hypertension    Kidney stones    NICM (nonischemic cardiomyopathy) ([place])    a. 07/2013 Echo: EF 20-25%, diff HK, Gr2 DD, mild MR, mod dil LA/RA. 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    Shockable heart rhythm detected by automated external defibrillator    Syncope    Type II diabetes mellitus ([place]) 2006   Patient Active Problem List   Diagnosis Date Noted   Cardiac arrest ([place]) 05/03/2022   AKI (acute kidney injury) ([place]) 05/03/2022   V-tach ([place]) 05/03/2022   CKD (chronic kidney disease) stage 2, GFR 60-89 ml/min 10/24/2019   Acute non-recurrent frontal sinusitis 06/27/2019   COVID-19 virus infection 06/27/2019   Glaucoma suspect 12/22/2018   Seasonal and perennial allergic rhinoconjunctivitis 10/25/2018   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   Urticaria 05/03/2018   S/P inguinal hernia repair 03/01/2018   Unilateral inguinal hernia without obstruction or gangrene 07/24/2017   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   Hypersomnia 02/28/2016   Abnormal PFT 02/24/2016   Shortness of breath 01/28/2016   Diabetic polyneuropathy associated with type 2 diabetes mellitus ([place]) 12/18/2015   Lumbar back pain 12/18/2015   Controlled type 2 diabetes mellitus with complication, with long-term current use of insulin ([place]) 09/17/2015   Left median nerve neuropathy 04/16/2015   Lesion of right median nerve at forearm 04/16/2015   ICD (implantable cardioverter-defibrillator) in place 41/58/3094   Chronic systolic congestive heart failure ([place]) 03/28/2014   Nonischemic cardiomyopathy ([place]) 11/09/2013   Syncope 10/20/2013   Chronic chest pain 07/28/2013   RENAL CALCULUS 05/13/2009   Obesity 05/08/2009   Former heavy cigarette smoker (20-39  per day) 05/08/2009   HOARSENESS 05/08/2009   ANXIETY 02/28/2008   ASTHMATIC BRONCHITIS, ACUTE 02/28/2008   GERD 02/28/2008   IRRITABLE BOWEL SYNDROME 02/28/2008   Home Medication(s) Prior to Admission medications   Medication Sig Start Date End Date Taking? Authorizing Provider  albuterol (VENTOLIN HFA) 108 (90 Base) MCG/ACT inhaler Inhale into the lungs every 6 (six) hours as needed for  wheezing or shortness of breath.   Yes [provider]  aspirin 81 MG tablet Take 81 mg by mouth daily.   Yes [provider]  atorvastatin (LIPITOR) 10 MG tablet Take 1 tablet (10 mg total) by mouth daily. 01/19/22  Yes Elsie Stain, MD  carvedilol (COREG) 6.25 MG tablet Take 1 tablet (6.25 mg total) by mouth 2 (two) times daily. 09/19/21  Yes Lelon Perla, MD  cetirizine (ZYRTEC) 10 MG tablet Take 1 tablet (10 mg total) by mouth daily. 01/30/22  Yes Valentina Shaggy, MD  dapagliflozin propanediol (FARXIGA) 5 MG TABS tablet Take 1 tablet (5 mg total) by mouth daily before breakfast. 01/19/22  Yes Elsie Stain, MD  Dulaglutide (TRULICITY) 1.5 WU/9.8JX SOPN Inject 1.5 mg into the skin once a week. 04/28/22  Yes [name], MD  furosemide (LASIX) 80 MG tablet Take 1 tablet (80 mg total) by mouth 2 (two) times daily. 04/21/22  Yes Lelon Perla, MD  gabapentin (NEURONTIN) 300 MG capsule TAKE 1 CAPSULE BY MOUTH EVERY DAY AT BEDTIME Patient taking differently: Take 300 mg by mouth at bedtime. 10/05/20  Yes [name], MD  insulin aspart (NOVOLOG FLEXPEN) 100 UNIT/ML FlexPen Inject 10 Units into the skin 2 (two) times daily before a meal. 04/07/22  Yes [name], MD  insulin glargine (LANTUS SOLOSTAR) 100 UNIT/ML Solostar Pen Inject 30 Units into the skin daily. 03/21/22  Yes [name], MD  ipratropium-albuterol (DUONEB) 0.5-2.5 (3) MG/3ML SOLN Take 3 mLs by nebulization every 6 (six) hours as needed. Patient taking differently: Take 3 mLs by nebulization every 6 (six) hours as needed (for wheezing or shortness of breath). 03/21/22  Yes Juanito Doom, MD  potassium chloride (KLOR-CON) 10 MEQ tablet Take 10 mEq by mouth daily. 11/02/20  Yes [provider]  sacubitril-valsartan (ENTRESTO) 24-26 MG Take 1 tablet by mouth 2 (two) times daily. 04/21/22  Yes Lelon Perla, MD  tamsulosin (FLOMAX) 0.4 MG CAPS capsule Take 2  capsules (0.8 mg total) by mouth daily. 01/19/22  Yes Elsie Stain, MD  Accu-Chek Softclix Lancets lancets Use as instructed 02/01/22   Cristie Hem, MD  Blood Glucose Monitoring Suppl (ACCU-CHEK GUIDE) w/Device KIT UAD 02/01/22   Cristie Hem, MD  chlorpheniramine-HYDROcodone (TUSSIONEX) 10-8 MG/5ML Take 5 mLs by mouth every 12 (twelve) hours as needed for cough. 03/21/22   Juanito Doom, MD  clobetasol ointment (TEMOVATE) 9.14 % Apply 1 Application topically 2 (two) times daily. 01/30/22   Valentina Shaggy, MD  Continuous Blood Gluc Receiver (FREESTYLE LIBRE 2 READER) DEVI Use to check blood sugar TID.  E11.65 03/06/22   [name], MD  Continuous Blood Gluc Sensor (FREESTYLE LIBRE 2 SENSOR) MISC Use to check blood sugar TID. Change sensor once every 14 days. E11.65 03/06/22   [name], MD  doxycycline (VIBRA-TABS) 100 MG tablet Take 1 tablet (100 mg total) by mouth 2 (two) times daily. Patient not taking: Reported on 05/03/2022 03/21/22   Juanito Doom, MD  EPINEPHrine 0.3 mg/0.3 mL IJ SOAJ  injection Inject 0.3 mg into the muscle as needed. 01/30/22   Valentina Shaggy, MD  famotidine (PEPCID) 20 MG tablet Take 1 tablet by mouth 1-2 times a day as needed. Patient taking differently: Take 20 mg by mouth See admin instructions. Take 1 tablet by mouth 1-2 times a day as needed for heartburn or indigestion 02/18/22   Valentina Shaggy, MD  fluticasone [facility]) 50 MCG/ACT nasal spray Use 1-2 sprays in each nostril once a day as needed for nasal congestion. 01/30/22   Valentina Shaggy, MD  glucose blood (ACCU-CHEK GUIDE) test strip Use as instructed 02/01/22   Cristie Hem, MD  Insulin Pen Needle (PEN NEEDLES) 31G X 8 MM MISC UAD 03/01/22   [name], MD  Lancet Devices [facility]) lancets 1 each by Other route 3 (three) times daily. 10/20/14   Funches, Adriana Mccallum, MD  methocarbamol (ROBAXIN) 500 MG tablet TAKE 1 TABLET BY MOUTH  ONCE DAILY AS NEEDED FOR MUSCLE SPASM Patient taking differently: Take 500 mg by mouth daily as needed for muscle spasms. 04/25/22   Rodena Goldmann A, DO  mometasone (ELOCON) 0.1 % cream Apply 1 application topically daily. 06/17/19   Marzetta Board, DPM  naproxen (NAPROSYN) 500 MG tablet Take 1 tablet (500 mg total) by mouth 2 (two) times daily with a meal. 04/05/22   Antonietta Breach, PA-C  promethazine-dextromethorphan (PROMETHAZINE-DM) 6.25-15 MG/5ML syrup Take 5 mLs by mouth 4 (four) times daily as needed for cough. 02/18/22   Elsie Stain, MD  sildenafil (VIAGRA) 100 MG tablet Take 1-2 tabs PO 1/2-1 hr prior to intercourse. 12/14/20   [name], MD  triamcinolone cream (KENALOG) 0.1 % Apply sparingly to red itchy areas twice daily as needed. Avoid face, neck, armpits or groin area. Do not use more than 3 weeks in a row. 01/30/22   Valentina Shaggy, MD                                                                                                                                    Past Surgical History Past Surgical History:  Procedure Laterality Date   CARDIAC CATHETERIZATION  08/10/13   CHOLECYSTECTOMY  ~ 2010   IMPLANTABLE CARDIOVERTER DEFIBRILLATOR IMPLANT  12/23/2013   STJ Fortify ICD implanted by [name] for cardiomyopathy and syncope   IMPLANTABLE CARDIOVERTER DEFIBRILLATOR IMPLANT N/A 12/23/2013   Procedure: IMPLANTABLE CARDIOVERTER DEFIBRILLATOR IMPLANT;  Surgeon: Coralyn Mark, MD;  Location: [facility] CATH LAB;  Service: Cardiovascular;  Laterality: N/A;   INGUINAL HERNIA REPAIR Left 03/01/2018   Procedure: OPEN REPAIR OF LEFT INGUINAL HERNIA WITH MESH;  Surgeon: Greer Pickerel, MD;  Location: [facility] ORS;  Service: General;  Laterality: Left;   LEFT HEART CATHETERIZATION WITH CORONARY ANGIOGRAM N/A 08/10/2013   Procedure: LEFT HEART CATHETERIZATION WITH CORONARY ANGIOGRAM;  Surgeon: Jettie Booze, MD;  Location: [facility] CATH LAB;  Service: Cardiovascular;  Laterality: N/A;  ORIF HUMERUS FRACTURE Left 08/12/2013   Procedure: OPEN REDUCTION INTERNAL FIXATION (ORIF) HUMERAL SHAFT FRACTURE;  Surgeon: Newt Minion, MD;  Location: [facility];  Service: Orthopedics;  Laterality: Left;  Open Reduction Internal Fixation Left Humerus   URETEROSCOPY     "laser for kidney stones"   Family History Family History  Problem Relation Age of Onset   Diabetes Father    Arthritis Father    Hypertension Father    Diabetes Mother    Arthritis Mother    Hypertension Mother    Hypertension Brother     Social History Social History   Tobacco Use   Smoking status: Former    Packs/day: 1.00    Years: 28.00    Total pack years: 28.00    Types: Cigarettes    Quit date: 06/03/2014    Years since quitting: 7.9   Smokeless tobacco: Never  Vaping Use   Vaping Use: Never used  Substance Use Topics   Alcohol use: Yes   Drug use: Yes    Types: Marijuana    Comment: occasionally   Allergies Bee venom  Review of Systems Review of Systems  Respiratory:  Positive for shortness of breath.   Cardiovascular:  Positive for chest pain.    Physical Exam Vital Signs  I have reviewed the triage vital signs BP 94/61   Pulse 82   Temp 99.9 F (37.7 C) (Oral)   Resp (!) 22   Ht 6' (1.829 m)   Wt 112.9 kg   SpO2 96%   BMI 33.77 kg/m   Physical Exam Constitutional:      General: He is in acute distress.     Appearance: Normal appearance. He is ill-appearing and diaphoretic.  HENT:     Head: Normocephalic and atraumatic.     Nose: No congestion or rhinorrhea.  Eyes:     General:        Right eye: No discharge.        Left eye: No discharge.     Extraocular Movements: Extraocular movements intact.     Pupils: Pupils are equal, round, and reactive to light.  Cardiovascular:     Rate and Rhythm: Normal rate and regular rhythm.     Heart sounds: No murmur heard. Pulmonary:     Effort: No respiratory distress.     Breath sounds: No wheezing or rales.  Abdominal:      General: There is no distension.     Tenderness: There is no abdominal tenderness.  Musculoskeletal:        General: Normal range of motion.     Cervical back: Normal range of motion.  Skin:    General: Skin is warm.     Coloration: Skin is pale.  Neurological:     General: No focal deficit present.     Mental Status: He is alert.     ED Results and Treatments Labs (all labs ordered are listed, but only abnormal results are displayed) Labs Reviewed  COMPREHENSIVE METABOLIC PANEL - Abnormal; Notable for the following components:      Result Value   Potassium 2.8 (*)    CO2 21 (*)    Glucose, Bld 244 (*)    BUN 32 (*)    Creatinine, Ser 2.84 (*)    Albumin 3.4 (*)    GFR, Estimated 25 (*)    Anion gap 18 (*)    All other components within normal limits  CBC WITH DIFFERENTIAL/PLATELET - Abnormal; Notable for the  following components:   WBC 19.5 (*)    Neutro Abs 15.8 (*)    All other components within normal limits  BRAIN NATRIURETIC PEPTIDE - Abnormal; Notable for the following components:   B Natriuretic Peptide 350.8 (*)    All other components within normal limits  CBC - Abnormal; Notable for the following components:   WBC 16.1 (*)    RBC 4.07 (*)    Hemoglobin 12.3 (*)    HCT 37.3 (*)    All other components within normal limits  CREATININE, SERUM - Abnormal; Notable for the following components:   Creatinine, Ser 2.61 (*)    GFR, Estimated 28 (*)    All other components within normal limits  MAGNESIUM - Abnormal; Notable for the following components:   Magnesium 2.6 (*)    All other components within normal limits  I-STAT CHEM 8, ED - Abnormal; Notable for the following components:   Potassium 2.8 (*)    BUN 33 (*)    Creatinine, Ser 2.80 (*)    Glucose, Bld 250 (*)    Calcium, Ion 1.13 (*)    All other components within normal limits  TROPONIN I (HIGH SENSITIVITY) - Abnormal; Notable for the following components:   Troponin I (High Sensitivity) 172 (*)     All other components within normal limits  TROPONIN I (HIGH SENSITIVITY) - Abnormal; Notable for the following components:   Troponin I (High Sensitivity) 553 (*)    All other components within normal limits  TROPONIN I (HIGH SENSITIVITY) - Abnormal; Notable for the following components:   Troponin I (High Sensitivity) 399 (*)    All other components within normal limits  MRSA NEXT GEN BY PCR, NASAL  HIV ANTIBODY (ROUTINE TESTING W REFLEX)  BASIC METABOLIC PANEL  CBC  MAGNESIUM  PHOSPHORUS  HEMOGLOBIN A1C                                                                                                                          Radiology DG Chest Portable 1 View  Result Date: 05/03/2022 CLINICAL DATA:  dyspnea EXAM: PORTABLE CHEST 1 VIEW COMPARISON:  Chest x-ray 03/21/2022, CT chest 05/08/2004 FINDINGS: Left chest wall single lead cardiac defibrillator in grossly appropriate position. Stable enlarged cardiac silhouette. The heart and mediastinal contours are enlarged. Low lung volumes. No focal consolidation. No pulmonary edema. No pleural effusion. No pneumothorax. No acute osseous abnormality. IMPRESSION: Cardiomegaly with no active disease. Electronically Signed   By: Iven Finn M.D.   On: 05/03/2022 20:53    Pertinent labs & imaging results that were available during my care of the patient were reviewed by me and considered in my medical decision making (see MDM for details).  Medications Ordered in ED Medications  norepinephrine (LEVOPHED) 32m in 2573m(0.016 mg/mL) premix infusion (8 mcg/min Intravenous IV Pump Association 05/04/22 0004)  polyethylene glycol (MIRALAX / GLYCOLAX) packet 17 g (has no administration in time range)  heparin injection 5,000 Units (5,000  Units Subcutaneous Given 05/03/22 2324)  aspirin EC tablet 81 mg (has no administration in time range)  0.9 %  sodium chloride infusion ( Intravenous Infusion Verify 05/04/22 0000)  levalbuterol (XOPENEX) nebulizer  solution 0.63 mg (has no administration in time range)  atorvastatin (LIPITOR) tablet 10 mg (10 mg Oral Given 05/03/22 2341)  Chlorhexidine Gluconate Cloth 2 % PADS 6 each (6 each Topical Given 05/03/22 2346)  insulin aspart (novoLOG) injection 0-15 Units (5 Units Subcutaneous Given 05/03/22 2353)  amiodarone (NEXTERONE PREMIX) 360-4.14 MG/200ML-% (1.8 mg/mL) IV infusion (30 mg/hr Intravenous Infusion Verify 05/04/22 0000)  amiodarone (NEXTERONE PREMIX) 360-4.14 MG/200ML-% (1.8 mg/mL) IV infusion (has no administration in time range)  Oral care mouth rinse (has no administration in time range)  amiodarone (NEXTERONE) 150-4.21 MG/100ML-% bolus (0 mg  Stopped 05/03/22 2029)  sodium chloride 0.9 % bolus 500 mL (0 mLs Intravenous Stopped 05/03/22 2112)  potassium chloride 10 mEq in 100 mL IVPB (0 mEq Intravenous Stopped 05/03/22 2340)  magnesium sulfate IVPB 2 g 50 mL (0 g Intravenous Stopped 05/03/22 2136)  HYDROmorphone (DILAUDID) injection 1 mg (1 mg Intravenous Given 05/03/22 2054)  lactated ringers bolus 500 mL (0 mLs Intravenous Stopped 05/03/22 2136)  ondansetron (ZOFRAN) injection 4 mg (4 mg Intravenous Given 05/03/22 2133)  lactated ringers bolus 500 mL (500 mLs Intravenous New Bag/Given 05/04/22 0002)                                                                                                                                     Procedures .Critical Care  Performed by: Teressa Lower, MD Authorized by: Teressa Lower, MD   Critical care provider statement:    Critical care time (minutes):  75   Critical care was necessary to treat or prevent imminent or life-threatening deterioration of the following conditions:  Cardiac failure   Critical care was time spent personally by me on the following activities:  Development of treatment plan with patient or surrogate, discussions with consultants, evaluation of patient's response to treatment, examination of patient, ordering and review  of laboratory studies, ordering and review of radiographic studies, ordering and performing treatments and interventions, pulse oximetry, re-evaluation of patient's condition and review of old charts   (including critical care time)  Medical Decision Making / ED Course   This patient presents to the ED for concern of chest pain, this involves an extensive number of treatment options, and is a complaint that carries with it a high risk of complications and morbidity.  The differential diagnosis includes ventricular tachycardia, electrolyte abnormality, ischemia, torsades  MDM: Patient seen in the emergency department for evaluation of chest pain secondary to AICD shocks.  Physical exam reveals an ill-appearing patient who is pale and diaphoretic and actively being shocked by his AICD.  Magnet placed over the AICD as we attempted to temporize the patient and amnio was bolused and a drip was started.  The patient  had multiple runs of NSVT and ultimately a sustained episode of ventricular tachycardia leading to bradycardia and asystole for which CPR was initiated for approximately 30 seconds as patient regained mental status and did CPR.  CPR was then terminated and pulse was sustained.  Dr. Radford Pax of cardiology was consulted immediately who came to bedside and evaluated the patient.  Recommending CCU admission.  Pain control and fluid resuscitation initiated and laboratory evaluation reveals a leukocytosis of 19.5, hemoglobin 12.3, potassium 2.8, BUN 32, creatinine 2.84, magnesium 2.6, initial high-sensitivity troponin 172 likely secondary to AICD shocks.  Blood pressures became labile after amiodarone initiation and fluid resuscitation was started and patient ultimately required low-dose Levophed initiation.  Patient then admitted to the ICU.   Additional history obtained: -Additional history obtained from brother -External records from outside source obtained and reviewed including: Chart review  including previous notes, labs, imaging, consultation notes   Lab Tests: -I ordered, reviewed, and interpreted labs.   The pertinent results include:   Labs Reviewed  COMPREHENSIVE METABOLIC PANEL - Abnormal; Notable for the following components:      Result Value   Potassium 2.8 (*)    CO2 21 (*)    Glucose, Bld 244 (*)    BUN 32 (*)    Creatinine, Ser 2.84 (*)    Albumin 3.4 (*)    GFR, Estimated 25 (*)    Anion gap 18 (*)    All other components within normal limits  CBC WITH DIFFERENTIAL/PLATELET - Abnormal; Notable for the following components:   WBC 19.5 (*)    Neutro Abs 15.8 (*)    All other components within normal limits  BRAIN NATRIURETIC PEPTIDE - Abnormal; Notable for the following components:   B Natriuretic Peptide 350.8 (*)    All other components within normal limits  CBC - Abnormal; Notable for the following components:   WBC 16.1 (*)    RBC 4.07 (*)    Hemoglobin 12.3 (*)    HCT 37.3 (*)    All other components within normal limits  CREATININE, SERUM - Abnormal; Notable for the following components:   Creatinine, Ser 2.61 (*)    GFR, Estimated 28 (*)    All other components within normal limits  MAGNESIUM - Abnormal; Notable for the following components:   Magnesium 2.6 (*)    All other components within normal limits  I-STAT CHEM 8, ED - Abnormal; Notable for the following components:   Potassium 2.8 (*)    BUN 33 (*)    Creatinine, Ser 2.80 (*)    Glucose, Bld 250 (*)    Calcium, Ion 1.13 (*)    All other components within normal limits  TROPONIN I (HIGH SENSITIVITY) - Abnormal; Notable for the following components:   Troponin I (High Sensitivity) 172 (*)    All other components within normal limits  TROPONIN I (HIGH SENSITIVITY) - Abnormal; Notable for the following components:   Troponin I (High Sensitivity) 553 (*)    All other components within normal limits  TROPONIN I (HIGH SENSITIVITY) - Abnormal; Notable for the following components:    Troponin I (High Sensitivity) 399 (*)    All other components within normal limits  MRSA NEXT GEN BY PCR, NASAL  HIV ANTIBODY (ROUTINE TESTING W REFLEX)  BASIC METABOLIC PANEL  CBC  MAGNESIUM  PHOSPHORUS  HEMOGLOBIN A1C      EKG   EKG Interpretation  Date/Time:    Ventricular Rate:    PR Interval:  QRS Duration:   QT Interval:    QTC Calculation:   R Axis:     Text Interpretation:           Imaging Studies ordered: I ordered imaging studies including chest x-ray I independently visualized and interpreted imaging. I agree with the radiologist interpretation   Medicines ordered and prescription drug management: Meds ordered this encounter  Medications   amiodarone (NEXTERONE) 150-4.21 MG/100ML-% bolus    Demetrius Revel A: cabinet override   amiodarone (NEXTERONE PREMIX) 360-4.14 MG/200ML-% (1.8 mg/mL) IV infusion    Demetrius Revel A: cabinet override   fentaNYL (SUBLIMAZE) 50 MCG/ML injection    Demetrius Revel A: cabinet override   DISCONTD: fentaNYL (SUBLIMAZE) injection 50 mcg   sodium chloride 0.9 % bolus 500 mL   potassium chloride 10 mEq in 100 mL IVPB   magnesium sulfate IVPB 2 g 50 mL   HYDROmorphone (DILAUDID) injection 1 mg   lactated ringers bolus 500 mL   DISCONTD: 0.9 %  sodium chloride infusion   norepinephrine (LEVOPHED) 66m in 2522m(0.016 mg/mL) premix infusion    Order Specific Question:   IV Access    Answer:   Peripheral   norepinephrine (LEVOPHED) 4-5 MG/250ML-% infusion SOLN    Marcy, Jamie: cabinet override   DISCONTD: docusate sodium (COLACE) capsule 100 mg   polyethylene glycol (MIRALAX / GLYCOLAX) packet 17 g   heparin injection 5,000 Units   DISCONTD: ipratropium-albuterol (DUONEB) 0.5-2.5 (3) MG/3ML nebulizer solution 3 mL   ondansetron (ZOFRAN) injection 4 mg   aspirin EC tablet 81 mg   0.9 %  sodium chloride infusion   levalbuterol (XOPENEX) nebulizer solution 0.63 mg   atorvastatin (LIPITOR) tablet 10 mg   DISCONTD:  dapagliflozin propanediol (FARXIGA) tablet 5 mg   Chlorhexidine Gluconate Cloth 2 % PADS 6 each   lactated ringers bolus 500 mL   insulin aspart (novoLOG) injection 0-15 Units    Order Specific Question:   Correction coverage:    Answer:   Moderate (average weight, post-op)    Order Specific Question:   CBG < 70:    Answer:   Implement Hypoglycemia Standing Orders and refer to Hypoglycemia Standing Orders sidebar report    Order Specific Question:   CBG 70 - 120:    Answer:   0 units    Order Specific Question:   CBG 121 - 150:    Answer:   2 units    Order Specific Question:   CBG 151 - 200:    Answer:   3 units    Order Specific Question:   CBG 201 - 250:    Answer:   5 units    Order Specific Question:   CBG 251 - 300:    Answer:   8 units    Order Specific Question:   CBG 301 - 350:    Answer:   11 units    Order Specific Question:   CBG 351 - 400:    Answer:   15 units    Order Specific Question:   CBG > 400    Answer:   call MD and obtain STAT lab verification   amiodarone (NEXTERONE PREMIX) 360-4.14 MG/200ML-% (1.8 mg/mL) IV infusion   amiodarone (NEXTERONE PREMIX) 360-4.14 MG/200ML-% (1.8 mg/mL) IV infusion   Oral care mouth rinse    -I have reviewed the patients home medicines and have made adjustments as needed  Critical interventions Amiodarone, CPR, emergent cardiology consultation, fluid resuscitation, pressors  Consultations Obtained: I requested  consultation with the cardiologist on-call Dr. Radford Pax,  and discussed lab and imaging findings as well as pertinent plan - they recommend: CCU admission and repletion of electrolytes   Cardiac Monitoring: The patient was maintained on a cardiac monitor.  I personally viewed and interpreted the cardiac monitored which showed an underlying rhythm of: Sinus tachycardia, ventricular tachycardia  Social Determinants of Health:  Factors impacting patients care include: none   Reevaluation: After the interventions noted  above, I reevaluated the patient and found that they have :improved  Co morbidities that complicate the patient evaluation  Past Medical History:  Diagnosis Date   AICD (automatic cardioverter/defibrillator) present    Chronic bronchitis ([place])    "get it most q yr" (12/23/2013)   Chronic systolic (congestive) heart failure ([place])    Fracture of left humerus    a. 07/2013.   GERD (gastroesophageal reflux disease)    not at present time   Heart murmur    "born w/it"    History of renal calculi    Hypertension    Kidney stones    NICM (nonischemic cardiomyopathy) ([place])    a. 07/2013 Echo: EF 20-25%, diff HK, Gr2 DD, mild MR, mod dil LA/RA. 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

## 2022-05-04 NOTE — Progress Notes (Signed)
NAME:  [name], MRN:  [id number], DOB:  1965-12-27, LOS: 1 ADMISSION DATE:  05/03/2022, CONSULTATION DATE:  11/25 REFERRING MD:  Kommor, CHIEF COMPLAINT:  Diarrhea   History of Present Illness:  [name] is an 56 y.o. M who presented to the [facility] ED via EMS with a chief complaint of diarrhea and ICD shocks.  Patient dose of trulicity increased on 73/71. Reports since increase having diarrhea daily. Denies nausea, vomiting, BRBPR, melena. Patient reportedly with ICD shock on AM on 11/25. Unable to take in PO on 11/25. EMS called.  In the [facility] ED he had a witnessed VTACH arrest with CPR and ROSC achieved with one shock (30 seconds). Return to baseline neurological status. K 2.8, Creat 2.84 (baseline 1.8-2.3), WBC 19.5. Tmax 99.1. Troponin 172. He was given diludid for chest pain post CPR. He developed hypotension (74/40). Cardiology consulted.   PCCM consulted for admission.  Pertinent  Medical History  NICM, Systolic CHF s/p ICD placement, DM2, CKD stage 3, obesity, BPH, GERD, chronic LBP secondary to spondylosis and spinal stenosis.  Significant Hospital Events: Including procedures, antibiotic start and stop dates in addition to other pertinent events   11/25 Presented to [facility]  Interim History / Subjective:  Patient stated not feeling well, complaining of chest pain from frequent AICD firing  Objective   Blood pressure (!) 104/54, pulse 66, temperature 99.9 F (37.7 C), temperature source Oral, resp. rate (!) 24, height 6' (1.829 m), weight 116.8 kg, SpO2 96 %.        Intake/Output Summary (Last 24 hours) at 05/04/2022 0953 Last data filed at 05/04/2022 0800 Gross per 24 hour  Intake 2409.09 ml  Output 725 ml  Net 1684.09 ml   Filed Weights   05/03/22 2007 05/04/22 0437  Weight: 112.9 kg 116.8 kg    Examination: Physical exam: General: Acute on chronically ill-appearing obese male, lying on the bed HEENT: Green/AT, eyes anicteric.  moist mucus  membranes Neuro: Alert, awake following commands Chest: Faint basal crackles bilaterally, no wheezes or rhonchi Heart: Regular rate and rhythm, no murmurs or gallops Abdomen: Soft, nontender, nondistended, bowel sounds present Skin: No rash   Resolved Hospital Problem list     Assessment & Plan:  VT cardiac arrest likely in the setting of hypokalemia  Nonischemic cardiomyopathy with chronic HFrEF s/p AICD [place] cardiac ischemia in the setting of frequent AICD firing No more episodes of AICD firing since last night once electrolytes are supplemented Cardiology is following Continue amiodarone infusion Patient is in sinus rhythm He does not look volume overloaded in fact looks a little bit of volume down He received IV fluid, now encourage oral fluid intake Trend troponins  Hypovolemic shock Diarrhea, likely medication induced Patient dose of trulicity increased on 06/26. Reports since increase having diarrhea daily up to 20 episodes per day Patient received IV fluid, still requiring vasopressor support with Levophed, titrate to MAP goal 65 Encourage oral fluid intake Trulicity has been stopped, patient only had 2 bowel movement since last night   AKI on CKD stage IIIb due to dehydration Hypokalemia/hypophosphatemia Monitor intake and output Avoid nephrotoxic agent Patient was rehydrated Continue aggressive electrolyte supplement and monitor  Poorly controlled diabetes type 2 with hyperglycemia, complicated with diabetic neuropathy Patient hemoglobin A1c in August was 15 Continue sliding scale insulin Hold Trulicity Started on Levemir 10 units twice daily Monitor fingerstick goal 140-180  Obesity Dietitian consulted   Best Practice (right click and "Reselect all SmartList Selections" daily)  Diet/type: Heart healthy DVT prophylaxis: prophylactic heparin  GI prophylaxis: PPI Lines: N/A Foley:  N/A Code Status:  full code Last date of multidisciplinary goals of  care discussion [Full scope]  Labs   CBC: Recent Labs  Lab 05/03/22 2010 05/03/22 2025 05/03/22 2147 05/04/22 0412  WBC 19.5*  --  16.1* 16.8*  NEUTROABS 15.8*  --   --   --   HGB 14.1 15.0 12.3* 11.6*  HCT 41.9 44.0 37.3* 35.5*  MCV 91.1  --  91.6 93.9  PLT 271  --  246 284    Basic Metabolic Panel: Recent Labs  Lab 05/03/22 2010 05/03/22 2025 05/03/22 2147 05/04/22 0412  NA 138 138  --  137  K 2.8* 2.8*  --  3.3*  CL 99 101  --  102  CO2 21*  --   --  20*  GLUCOSE 244* 250*  --  267*  BUN 32* 33*  --  35*  CREATININE 2.84* 2.80* 2.61* 2.62*  CALCIUM 9.0  --   --  8.7*  MG  --   --  2.6* 2.4  PHOS  --   --   --  2.1*   GFR: Estimated Creatinine Clearance: 41.1 mL/min (A) (by C-G formula based on SCr of 2.62 mg/dL (H)). Recent Labs  Lab 05/03/22 2010 05/03/22 2147 05/04/22 0412  WBC 19.5* 16.1* 16.8*    Liver Function Tests: Recent Labs  Lab 05/03/22 2010  AST 15  ALT 12  ALKPHOS 58  BILITOT 1.2  PROT 7.6  ALBUMIN 3.4*   No results for input(s): "LIPASE", "AMYLASE" in the last 168 hours. No results for input(s): "AMMONIA" in the last 168 hours.  ABG    Component Value Date/Time   PHART 7.410 05/08/2016 1432   PCO2ART 38.6 05/08/2016 1432   PO2ART 91.2 05/08/2016 1432   HCO3 27.7 02/01/2022 1704   TCO2 22 05/03/2022 2025   ACIDBASEDEF 0.1 05/08/2016 1432   O2SAT 87 02/01/2022 1704     Coagulation Profile: No results for input(s): "INR", "PROTIME" in the last 168 hours.  Cardiac Enzymes: No results for input(s): "CKTOTAL", "CKMB", "CKMBINDEX", "TROPONINI" in the last 168 hours.  HbA1C: HbA1c, POC (controlled diabetic range)  Date/Time Value Ref Range Status  01/30/2022 04:59 PM 15.0 (A) 0.0 - 7.0 % Final    Comment:    Greater than 15.0  03/19/2021 04:09 PM 8.9 (A) 0.0 - 7.0 % Final   Hgb A1c MFr Bld  Date/Time Value Ref Range Status  08/26/2021 04:08 PM 13.0 (H) 4.8 - 5.6 % Final    Comment:             Prediabetes: 5.7 - 6.4           Diabetes: >6.4          Glycemic control for adults with diabetes: <7.0     CBG: Recent Labs  Lab 05/04/22 0433 05/04/22 0839  GLUCAP 240* 264*      Total critical care time: 38 minutes  Performed by: [place] care time was exclusive of separately billable procedures and treating other patients.   Critical care was necessary to treat or prevent imminent or life-threatening deterioration.   Critical care was time spent personally by me on the following activities: development of treatment plan with patient and/or surrogate as well as nursing, discussions with consultants, evaluation of patient's response to treatment, examination of patient, obtaining history from patient or surrogate, ordering and performing treatments and  interventions, ordering and review of laboratory studies, ordering and review of radiographic studies, pulse oximetry and re-evaluation of patient's condition.   Jacky Kindle, MD [place] See Amion for pager If no response to pager, please call [phone] until 7pm After 7pm, Please call E-link [phone]

## 2022-05-04 NOTE — Procedures (Signed)
Arterial Catheter Insertion Procedure Note  [name]  [id number]  11-20-1965  Date:05/04/22  Time:10:06 PM    Provider Performing: Namon Cirri    Procedure: Insertion of Arterial Line [phone]) without US guidance  Indication(s) Blood pressure monitoring and/or need for frequent ABGs  Consent Risks of the procedure as well as the alternatives and risks of each were explained to the patient and/or caregiver.  Consent for the procedure was obtained and is signed in the bedside chart  Anesthesia None   Time Out Verified patient identification, verified procedure, site/side was marked, verified correct patient position, special equipment/implants available, medications/allergies/relevant history reviewed, required imaging and test results available.   Sterile Technique Maximal sterile technique including full sterile barrier drape, hand hygiene, sterile gown, sterile gloves, mask, hair covering, sterile ultrasound probe cover (if used).   Procedure Description Area of catheter insertion was cleaned with chlorhexidine and draped in sterile fashion. Without real-time ultrasound guidance an arterial catheter was placed into the right radial artery.  Appropriate arterial tracings confirmed on monitor.     Complications/Tolerance None; patient tolerated the procedure well.   EBL Minimal   Specimen(s) None

## 2022-05-04 NOTE — Progress Notes (Signed)
At [place], patient had a 51 beat run of Ventricular tachycardia and was then shocked by his ICD back into a normal sinus rhythm. [place] doctor notified. Still awaiting BMP results now.  According to the patient's own account, he has now been shocked 8 times. The patient was briefly unresponsive during the episode. About ten minutes later, the patient started complaining of shortness of breathe but oxygen saturation was still 95% and above. He looks generally very uncomfortable and is anxious about being shocked again. Will continue to monitor.

## 2022-05-05 ENCOUNTER — Ambulatory Visit: Payer: Medicaid Other | Admitting: Internal Medicine

## 2022-05-05 ENCOUNTER — Inpatient Hospital Stay (HOSPITAL_COMMUNITY): Payer: Medicaid Other

## 2022-05-05 ENCOUNTER — Encounter (HOSPITAL_COMMUNITY): Payer: Self-pay | Admitting: Pulmonary Disease

## 2022-05-05 DIAGNOSIS — I469 Cardiac arrest, cause unspecified: Secondary | ICD-10-CM | POA: Diagnosis not present

## 2022-05-05 DIAGNOSIS — L0231 Cutaneous abscess of buttock: Secondary | ICD-10-CM

## 2022-05-05 DIAGNOSIS — I9589 Other hypotension: Secondary | ICD-10-CM | POA: Diagnosis not present

## 2022-05-05 DIAGNOSIS — N179 Acute kidney failure, unspecified: Secondary | ICD-10-CM | POA: Diagnosis not present

## 2022-05-05 DIAGNOSIS — L03311 Cellulitis of abdominal wall: Secondary | ICD-10-CM | POA: Diagnosis not present

## 2022-05-05 DIAGNOSIS — K611 Rectal abscess: Secondary | ICD-10-CM

## 2022-05-05 DIAGNOSIS — I472 Ventricular tachycardia, unspecified: Secondary | ICD-10-CM | POA: Diagnosis not present

## 2022-05-05 DIAGNOSIS — E876 Hypokalemia: Secondary | ICD-10-CM

## 2022-05-05 DIAGNOSIS — E861 Hypovolemia: Secondary | ICD-10-CM | POA: Diagnosis not present

## 2022-05-05 LAB — BASIC METABOLIC PANEL
Anion gap: 15 (ref 5–15)
BUN: 45 mg/dL — ABNORMAL HIGH (ref 6–20)
CO2: 20 mmol/L — ABNORMAL LOW (ref 22–32)
Calcium: 8.6 mg/dL — ABNORMAL LOW (ref 8.9–10.3)
Chloride: 101 mmol/L (ref 98–111)
Creatinine, Ser: 3.08 mg/dL — ABNORMAL HIGH (ref 0.61–1.24)
GFR, Estimated: 23 mL/min — ABNORMAL LOW (ref >60)
Glucose, Bld: 131 mg/dL — ABNORMAL HIGH (ref 70–99)
Potassium: 4.5 mmol/L (ref 3.5–5.1)
Sodium: 136 mmol/L (ref 135–145)

## 2022-05-05 LAB — C DIFFICILE QUICK SCREEN W PCR REFLEX
C Diff antigen: NEGATIVE
C Diff interpretation: NOT DETECTED
C Diff toxin: NEGATIVE

## 2022-05-05 LAB — COMPREHENSIVE METABOLIC PANEL
ALT: 13 U/L (ref 0–44)
AST: 18 U/L (ref 15–41)
Albumin: 2.6 g/dL — ABNORMAL LOW (ref 3.5–5.0)
Alkaline Phosphatase: 51 U/L (ref 38–126)
Anion gap: 13 (ref 5–15)
BUN: 43 mg/dL — ABNORMAL HIGH (ref 6–20)
CO2: 21 mmol/L — ABNORMAL LOW (ref 22–32)
Calcium: 8 mg/dL — ABNORMAL LOW (ref 8.9–10.3)
Chloride: 98 mmol/L (ref 98–111)
Creatinine, Ser: 3.06 mg/dL — ABNORMAL HIGH (ref 0.61–1.24)
GFR, Estimated: 23 mL/min — ABNORMAL LOW (ref >60)
Glucose, Bld: 123 mg/dL — ABNORMAL HIGH (ref 70–99)
Potassium: 3.1 mmol/L — ABNORMAL LOW (ref 3.5–5.1)
Sodium: 132 mmol/L — ABNORMAL LOW (ref 135–145)
Total Bilirubin: 0.8 mg/dL (ref 0.3–1.2)
Total Protein: 6.7 g/dL (ref 6.5–8.1)

## 2022-05-05 LAB — HEMOGLOBIN A1C
Hgb A1c MFr Bld: 10.2 % — ABNORMAL HIGH (ref 4.8–5.6)
Mean Plasma Glucose: 246 mg/dL

## 2022-05-05 LAB — GLUCOSE, CAPILLARY
Glucose-Capillary: 119 mg/dL — ABNORMAL HIGH (ref 70–99)
Glucose-Capillary: 123 mg/dL — ABNORMAL HIGH (ref 70–99)
Glucose-Capillary: 149 mg/dL — ABNORMAL HIGH (ref 70–99)
Glucose-Capillary: 154 mg/dL — ABNORMAL HIGH (ref 70–99)
Glucose-Capillary: 155 mg/dL — ABNORMAL HIGH (ref 70–99)
Glucose-Capillary: 201 mg/dL — ABNORMAL HIGH (ref 70–99)
Glucose-Capillary: 245 mg/dL — ABNORMAL HIGH (ref 70–99)

## 2022-05-05 LAB — CBC WITH DIFFERENTIAL/PLATELET
Abs Immature Granulocytes: 0.09 10*3/uL — ABNORMAL HIGH (ref 0.00–0.07)
Basophils Absolute: 0.1 10*3/uL (ref 0.0–0.1)
Basophils Relative: 0 %
Eosinophils Absolute: 0 10*3/uL (ref 0.0–0.5)
Eosinophils Relative: 0 %
HCT: 35.9 % — ABNORMAL LOW (ref 39.0–52.0)
Hemoglobin: 12.4 g/dL — ABNORMAL LOW (ref 13.0–17.0)
Immature Granulocytes: 1 %
Lymphocytes Relative: 9 %
Lymphs Abs: 1.7 10*3/uL (ref 0.7–4.0)
MCH: 30.5 pg (ref 26.0–34.0)
MCHC: 34.5 g/dL (ref 30.0–36.0)
MCV: 88.4 fL (ref 80.0–100.0)
Monocytes Absolute: 0.8 10*3/uL (ref 0.1–1.0)
Monocytes Relative: 5 %
Neutro Abs: 15.3 10*3/uL — ABNORMAL HIGH (ref 1.7–7.7)
Neutrophils Relative %: 85 %
Platelets: 243 10*3/uL (ref 150–400)
RBC: 4.06 MIL/uL — ABNORMAL LOW (ref 4.22–5.81)
RDW: 14.6 % (ref 11.5–15.5)
Smear Review: NORMAL
WBC: 18 10*3/uL — ABNORMAL HIGH (ref 4.0–10.5)
nRBC: 0 % (ref 0.0–0.2)

## 2022-05-05 LAB — MAGNESIUM: Magnesium: 2.6 mg/dL — ABNORMAL HIGH (ref 1.7–2.4)

## 2022-05-05 LAB — PHOSPHORUS: Phosphorus: 2.6 mg/dL (ref 2.5–4.6)

## 2022-05-05 MED ORDER — AMOXICILLIN-POT CLAVULANATE 875-125 MG PO TABS
1.0000 | ORAL_TABLET | Freq: Two times a day (BID) | ORAL | Status: DC
  Administered 2022-05-05: 1 via ORAL
  Filled 2022-05-05: qty 1

## 2022-05-05 MED ORDER — TAMSULOSIN HCL 0.4 MG PO CAPS
0.4000 mg | ORAL_CAPSULE | Freq: Every day | ORAL | Status: DC
  Administered 2022-05-05 – 2022-05-17 (×13): 0.4 mg via ORAL
  Filled 2022-05-05 (×13): qty 1

## 2022-05-05 MED ORDER — HYDROMORPHONE HCL 1 MG/ML IJ SOLN: 0.5000 mg | INTRAMUSCULAR | Status: AC

## 2022-05-05 MED ORDER — LOPERAMIDE HCL 2 MG PO CAPS
2.0000 mg | ORAL_CAPSULE | ORAL | Status: DC | PRN
  Administered 2022-05-05: 2 mg via ORAL
  Filled 2022-05-05: qty 1

## 2022-05-05 MED ORDER — POTASSIUM CHLORIDE CRYS ER 20 MEQ PO TBCR
40.0000 meq | EXTENDED_RELEASE_TABLET | ORAL | Status: AC
  Administered 2022-05-05 (×2): 40 meq via ORAL
  Filled 2022-05-05 (×2): qty 2

## 2022-05-05 MED ORDER — HYDROMORPHONE HCL 1 MG/ML IJ SOLN
0.5000 mg | INTRAMUSCULAR | Status: AC
  Administered 2022-05-05: 0.5 mg via INTRAVENOUS
  Filled 2022-05-05 (×2): qty 0.5

## 2022-05-05 MED ORDER — PIPERACILLIN-TAZOBACTAM 3.375 G IVPB
3.3750 g | Freq: Three times a day (TID) | INTRAVENOUS | Status: AC
  Administered 2022-05-05 – 2022-05-13 (×26): 3.375 g via INTRAVENOUS
  Filled 2022-05-05 (×26): qty 50

## 2022-05-05 MED ORDER — ALPRAZOLAM 0.5 MG PO TABS
0.5000 mg | ORAL_TABLET | Freq: Once | ORAL | Status: AC
  Administered 2022-05-05: 0.5 mg via ORAL
  Filled 2022-05-05: qty 1

## 2022-05-05 MED ORDER — LACTATED RINGERS IV BOLUS
500.0000 mL | Freq: Once | INTRAVENOUS | Status: AC
  Administered 2022-05-05: 500 mL via INTRAVENOUS

## 2022-05-05 MED ORDER — OXYCODONE HCL 5 MG PO TABS
5.0000 mg | ORAL_TABLET | ORAL | Status: DC | PRN
  Administered 2022-05-05 (×2): 5 mg via ORAL
  Filled 2022-05-05 (×2): qty 1

## 2022-05-05 NOTE — Progress Notes (Signed)
RT called to assess [name].  RT found [name] flat and not drawing back.  Attempts to reposition unsuccessful.  RN aware.  Awaiting order to replace line.  RT will cont to monitor.

## 2022-05-05 NOTE — Consult Note (Signed)
[name] 11-19-1965  [id number].    Requesting MD: Dr. Noemi Chapel Chief Complaint/Reason for Consult: right gluteal cellulitis and wound  HPI:  This is a 56 yo black male with a history of CHF who had an AICD placed 6 years ago, HTN, GERD, DM, and obesity who was admitted on Friday secondary to vtach and him being shocked multiple times.  He has since been shocked 2 more times since admission.  He noted pain in his right gluteus on Friday, and he had been treating this with placing alcohol on this.  This has been worsening since admission.  His WBC is up to 18K.  He was started on augmentin this morning and we have been asked to see him for further recommendations.  ROS: ROS: Please see HPI.  Denies CP, but has chronic SOB  Family History  Problem Relation Age of Onset   Diabetes Father    Arthritis Father    Hypertension Father    Diabetes Mother    Arthritis Mother    Hypertension Mother    Hypertension Brother     Past Medical History:  Diagnosis Date   AICD (automatic cardioverter/defibrillator) present    Chronic bronchitis ([place])    "get it most q yr" (12/23/2013)   Chronic systolic (congestive) heart failure ([place])    Fracture of left humerus    a. 07/2013.   GERD (gastroesophageal reflux disease)    not at present time   Heart murmur    "born w/it"    History of renal calculi    Hypertension    Kidney stones    NICM (nonischemic cardiomyopathy) ([place])    a. 07/2013 Echo: EF 20-25%, diff HK, Gr2 DD, mild MR, mod dil LA/RA. 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    Shockable heart rhythm detected by automated external defibrillator    Syncope    Type II diabetes mellitus ([place]) 2006    Past Surgical History:  Procedure Laterality Date   CARDIAC CATHETERIZATION  08/10/13   CHOLECYSTECTOMY  ~ 2010   IMPLANTABLE CARDIOVERTER DEFIBRILLATOR IMPLANT  12/23/2013   STJ Fortify ICD  implanted by [name] for cardiomyopathy and syncope   IMPLANTABLE CARDIOVERTER DEFIBRILLATOR IMPLANT N/A 12/23/2013   Procedure: IMPLANTABLE CARDIOVERTER DEFIBRILLATOR IMPLANT;  Surgeon: Coralyn Mark, MD;  Location: [facility] CATH LAB;  Service: Cardiovascular;  Laterality: N/A;   INGUINAL HERNIA REPAIR Left 03/01/2018   Procedure: OPEN REPAIR OF LEFT INGUINAL HERNIA WITH MESH;  Surgeon: Greer Pickerel, MD;  Location: [facility] ORS;  Service: General;  Laterality: Left;   LEFT HEART CATHETERIZATION WITH CORONARY ANGIOGRAM N/A 08/10/2013   Procedure: LEFT HEART CATHETERIZATION WITH CORONARY ANGIOGRAM;  Surgeon: Jettie Booze, MD;  Location: [facility] C[facility] LAB;  Service: Cardiovascular;  Laterality: N/A;   ORIF HUMERUS FRACTURE Left 08/12/2013   Procedure: OPEN REDUCTION INTERNAL FIXATION (ORIF) HUMERAL SHAFT FRACTURE;  Surgeon: Newt Minion, MD;  Location: [facility] [place];  Service: Orthopedics;  Laterality: Left;  Open Reduction Internal Fixation Left Humerus   URETEROSCOPY     "laser for kidney stones"    Social History:  reports that he quit smoking about 7 years ago. His smoking use included cigarettes. He has a 28.00 pack-year smoking history. He has never used smokeless tobacco. He reports current alcohol use. He reports current drug use. Drug: Marijuana.  Allergies:  Allergies  Allergen Reactions   Bee Venom  Anaphylaxis    Medications Prior to Admission  Medication Sig Dispense Refill   albuterol (VENTOLIN HFA) 108 (90 Base) MCG/ACT inhaler Inhale into the lungs every 6 (six) hours as needed for wheezing or shortness of breath.     aspirin 81 MG tablet Take 81 mg by mouth daily.     atorvastatin (LIPITOR) 10 MG tablet Take 1 tablet (10 mg total) by mouth daily. 90 tablet 1   carvedilol (COREG) 6.25 MG tablet Take 1 tablet (6.25 mg total) by mouth 2 (two) times daily. 60 tablet 7   cetirizine (ZYRTEC) 10 MG tablet Take 1 tablet (10 mg total) by mouth daily. 90 tablet 1   dapagliflozin propanediol (FARXIGA) 5 MG  TABS tablet Take 1 tablet (5 mg total) by mouth daily before breakfast. 90 tablet 1   Dulaglutide (TRULICITY) 1.5 ZO/1.0RU SOPN Inject 1.5 mg into the skin once a week. 2 mL 3   furosemide (LASIX) 80 MG tablet Take 1 tablet (80 mg total) by mouth 2 (two) times daily. 180 tablet 1   gabapentin (NEURONTIN) 300 MG capsule TAKE 1 CAPSULE BY MOUTH EVERY DAY AT BEDTIME (Patient taking differently: Take 300 mg by mouth at bedtime.) 90 capsule 1   insulin aspart (NOVOLOG FLEXPEN) 100 UNIT/ML FlexPen Inject 10 Units into the skin 2 (two) times daily before a meal. 15 mL 2   insulin glargine (LANTUS SOLOSTAR) 100 UNIT/ML Solostar Pen Inject 30 Units into the skin daily. 15 mL 1   ipratropium-albuterol (DUONEB) 0.5-2.5 (3) MG/3ML SOLN Take 3 mLs by nebulization every 6 (six) hours as needed. (Patient taking differently: Take 3 mLs by nebulization every 6 (six) hours as needed (for wheezing or shortness of breath).) 360 mL 2   potassium chloride (KLOR-CON) 10 MEQ tablet Take 10 mEq by mouth daily.     sacubitril-valsartan (ENTRESTO) 24-26 MG Take 1 tablet by mouth 2 (two) times daily. 180 tablet 1   tamsulosin (FLOMAX) 0.4 MG CAPS capsule Take 2 capsules (0.8 mg total) by mouth daily. 180 capsule 1   Accu-Chek Softclix Lancets lancets Use as instructed 100 each 12   Blood Glucose Monitoring Suppl (ACCU-CHEK GUIDE) w/Device KIT UAD 1 kit 0   chlorpheniramine-HYDROcodone (TUSSIONEX) 10-8 MG/5ML Take 5 mLs by mouth every 12 (twelve) hours as needed for cough. 115 mL 0   clobetasol ointment (TEMOVATE) 0.45 % Apply 1 Application topically 2 (two) times daily. 30 g 3   Continuous Blood Gluc Receiver (FREESTYLE LIBRE 2 READER) DEVI Use to check blood sugar TID.  E11.65 1 each 0   Continuous Blood Gluc Sensor (FREESTYLE LIBRE 2 SENSOR) MISC Use to check blood sugar TID. Change sensor once every 14 days. E11.65 2 each 3   doxycycline (VIBRA-TABS) 100 MG tablet Take 1 tablet (100 mg total) by mouth 2 (two) times daily.  (Patient not taking: Reported on 05/03/2022) 10 tablet 0   EPINEPHrine 0.3 mg/0.3 mL IJ SOAJ injection Inject 0.3 mg into the muscle as needed. 1 each 1   famotidine (PEPCID) 20 MG tablet Take 1 tablet by mouth 1-2 times a day as needed. (Patient taking differently: Take 20 mg by mouth See admin instructions. Take 1 tablet by mouth 1-2 times a day as needed for heartburn or indigestion) 90 tablet 1   fluticasone (FLONASE) 50 MCG/ACT nasal spray Use 1-2 sprays in each nostril once a day as needed for nasal congestion. 16 g 5   glucose blood (ACCU-CHEK GUIDE) test strip Use as instructed 100 each 12  Insulin Pen Needle (PEN NEEDLES) 31G X 8 MM MISC UAD 100 each 6   Lancet Devices (ACCU-CHEK SOFTCLIX) lancets 1 each by Other route 3 (three) times daily. 1 each 0   methocarbamol (ROBAXIN) 500 MG tablet TAKE 1 TABLET BY MOUTH ONCE DAILY AS NEEDED FOR MUSCLE SPASM (Patient taking differently: Take 500 mg by mouth daily as needed for muscle spasms.) 30 tablet 0   mometasone (ELOCON) 0.1 % cream Apply 1 application topically daily. 45 g 1   naproxen (NAPROSYN) 500 MG tablet Take 1 tablet (500 mg total) by mouth 2 (two) times daily with a meal. 30 tablet 0   promethazine-dextromethorphan (PROMETHAZINE-DM) 6.25-15 MG/5ML syrup Take 5 mLs by mouth 4 (four) times daily as needed for cough. 180 mL 0   sildenafil (VIAGRA) 100 MG tablet Take 1-2 tabs PO 1/2-1 hr prior to intercourse. 20 tablet 4   triamcinolone cream (KENALOG) 0.1 % Apply sparingly to red itchy areas twice daily as needed. Avoid face, neck, armpits or groin area. Do not use more than 3 weeks in a row. 45 g 5     Physical Exam: Blood pressure 107/65, pulse 82, temperature 98.9 F (37.2 C), temperature source Oral, resp. rate (!) 34, height 6' (1.829 m), weight 118.9 kg, SpO2 93 %. General: pleasant, WD, WN, obese black male who is laying in bed in NAD HEENT: head is normocephalic, atraumatic.  Sclera are noninjected.  PERRL. Mouth is pink and  moist Heart: regular, rate, and rhythm.  AICD in place Lungs: CTAB, no wheezes, rhonchi, or rales noted.  Respiratory effort nonlabored Skin: warm and dry with no masses, lesions, or rashes.  Right gluteal wound just lateral to the anus with an eschar present of white fibrin, but no drainage.  This is nickel size.  This area is not fluctuant or indurated; however he is tender anterior towards his perineum, superior, and lateral.  Laterally more involving the true gluteus he has more edema and some cellulitis.  This area is more tender than the actual wound itself.  Psych: A&Ox3 with an appropriate affect.   Results for orders placed or performed during the hospital encounter of 05/03/22 (from the past 48 hour(s))  Comprehensive metabolic panel     Status: Abnormal   Collection Time: 05/03/22  8:10 PM  Result Value Ref Range   Sodium 138 135 - 145 mmol/L   Potassium 2.8 (L) 3.5 - 5.1 mmol/L   Chloride 99 98 - 111 mmol/L   CO2 21 (L) 22 - 32 mmol/L   Glucose, Bld 244 (H) 70 - 99 mg/dL    Comment: Glucose reference range applies only to samples taken after fasting for at least 8 hours.   BUN 32 (H) 6 - 20 mg/dL   Creatinine, Ser 2.84 (H) 0.61 - 1.24 mg/dL   Calcium 9.0 8.9 - 10.3 mg/dL   Total Protein 7.6 6.5 - 8.1 g/dL   Albumin 3.4 (L) 3.5 - 5.0 g/dL   AST 15 15 - 41 U/L   ALT 12 0 - 44 U/L   Alkaline Phosphatase 58 38 - 126 U/L   Total Bilirubin 1.2 0.3 - 1.2 mg/dL   GFR, Estimated 25 (L) >60 mL/min    Comment: (NOTE) Calculated using the CKD-EPI Creatinine Equation (2021)    Anion gap 18 (H) 5 - 15    Comment: Performed at [facility], [place] [address] [place], [place] [address]  CBC with Differential     Status: Abnormal  Collection Time: 05/03/22  8:10 PM  Result Value Ref Range   WBC 19.5 (H) 4.0 - 10.5 K/uL   RBC 4.60 4.22 - 5.81 MIL/uL   Hemoglobin 14.1 13.0 - 17.0 g/dL   HCT 41.9 39.0 - 52.0 %   MCV 91.1 80.0 - 100.0 fL   MCH 30.7 26.0 - 34.0 pg   MCHC 33.7 30.0  - 36.0 g/dL   RDW 14.6 11.5 - 15.5 %   Platelets 271 150 - 400 K/uL   nRBC 0.0 0.0 - 0.2 %   Neutrophils Relative % 81 %   Neutro Abs 15.8 (H) 1.7 - 7.7 K/uL   Lymphocytes Relative 13 %   Lymphs Abs 2.5 0.7 - 4.0 K/uL   Monocytes Relative 5 %   Monocytes Absolute 1.0 0.1 - 1.0 K/uL   Eosinophils Relative 1 %   Eosinophils Absolute 0.2 0.0 - 0.5 K/uL   Basophils Relative 0 %   Basophils Absolute 0.0 0.0 - 0.1 K/uL   nRBC 0 0 /100 WBC   Abs Immature Granulocytes 0.00 0.00 - 0.07 K/uL    Comment: Performed at [facility], [address], [place], [place] [address]  Troponin I (High Sensitivity)     Status: Abnormal   Collection Time: 05/03/22  8:10 PM  Result Value Ref Range   Troponin I (High Sensitivity) 172 (HH) <18 ng/L    Comment: CRITICAL RESULT CALLED TO, READ BACK BY AND VERIFIED WITH [facility] YUAL RN 05/03/22 2112 M KOROLESKI (NOTE) Elevated high sensitivity troponin I (hsTnI) values and significant  changes across serial measurements may suggest ACS but many other  chronic and acute conditions are known to elevate hsTnI results.  Refer to the "Links" section for chest pain algorithms and additional  guidance. Performed at [facility], [place] [address], [place], [place] [address]   Brain natriuretic peptide     Status: Abnormal   Collection Time: 05/03/22  8:10 PM  Result Value Ref Range   B Natriuretic Peptide 350.8 (H) 0.0 - 100.0 pg/mL    Comment: Performed at [facility] [address], [place], [place] [address]  I-stat chem 8, ED (not at [facility] or [facility])     Status: Abnormal   Collection Time: 05/03/22  8:25 PM  Result Value Ref Range   Sodium 138 135 - 145 mmol/L   Potassium 2.8 (L) 3.5 - 5.1 mmol/L   Chloride 101 98 - 111 mmol/L   BUN 33 (H) 6 - 20 mg/dL   Creatinine, Ser 2.80 (H) 0.61 - 1.24 mg/dL   Glucose, Bld 250 (H) 70 - 99 mg/dL    Comment: Glucose reference range applies only to samples taken after fasting for at least 8 hours.   Calcium,  Ion 1.13 (L) 1.15 - 1.40 mmol/L   TCO2 22 22 - 32 mmol/L   Hemoglobin 15.0 13.0 - 17.0 g/dL   HCT 44.0 39.0 - 52.0 %  HIV Antibody (routine testing w rflx)     Status: None   Collection Time: 05/03/22  9:47 PM  Result Value Ref Range   HIV Screen 4th Generation wRfx Non Reactive Non Reactive    Comment: Performed at [facility], [place] [address] [place] [address]  CBC     Status: Abnormal   Collection Time: 05/03/22  9:47 PM  Result Value Ref Range   WBC 16.1 (H) 4.0 - 10.5 K/uL   RBC 4.07 (L) 4.22 - 5.81 MIL/uL  Hemoglobin 12.3 (L) 13.0 - 17.0 g/dL   HCT 37.3 (L) 39.0 - 52.0 %   MCV 91.6 80.0 - 100.0 fL   MCH 30.2 26.0 - 34.0 pg   MCHC 33.0 30.0 - 36.0 g/dL   RDW 14.5 11.5 - 15.5 %   Platelets 246 150 - 400 K/uL   nRBC 0.0 0.0 - 0.2 %    Comment: Performed at [facility] [address], [place], [place] [address]  Creatinine, serum     Status: Abnormal   Collection Time: 05/03/22  9:47 PM  Result Value Ref Range   Creatinine, Ser 2.61 (H) 0.61 - 1.24 mg/dL   GFR, Estimated 28 (L) >60 mL/min    Comment: (NOTE) Calculated using the CKD-EPI Creatinine Equation (2021) Performed at [facility] [address], [place], [place] [address]   Magnesium     Status: Abnormal   Collection Time: 05/03/22  9:47 PM  Result Value Ref Range   Magnesium 2.6 (H) 1.7 - 2.4 mg/dL    Comment: Performed at [facility] [address], [place], [place] [address]  Troponin I (High Sensitivity)     Status: Abnormal   Collection Time: 05/03/22  9:47 PM  Result Value Ref Range   Troponin I (High Sensitivity) 399 (HH) <18 ng/L    Comment: CRITICAL VALUE NOTED. VALUE IS CONSISTENT WITH PREVIOUSLY REPORTED/CALLED VALUE (NOTE) Elevated high sensitivity troponin I (hsTnI) values and significant  changes across serial measurements may suggest ACS but many other  chronic and acute conditions are known to elevate hsTnI results.  Refer to the "Links" section for chest  pain algorithms and additional  guidance. Performed at [facility], [place] [address], [place], [place] [address]   Glucose, capillary     Status: Abnormal   Collection Time: 05/03/22 10:41 PM  Result Value Ref Range   Glucose-Capillary 245 (H) 70 - 99 mg/dL    Comment: Glucose reference range applies only to samples taken after fasting for at least 8 hours.  MRSA Next Gen by PCR, Nasal     Status: None   Collection Time: 05/03/22 10:48 PM   Specimen: Nasal Mucosa; Nasal Swab  Result Value Ref Range   MRSA by PCR Next Gen NOT DETECTED NOT DETECTED    Comment: (NOTE) The GeneXpert MRSA Assay (FDA approved for NASAL specimens only), is one component of a comprehensive MRSA colonization surveillance program. It is not intended to diagnose MRSA infection nor to guide or monitor treatment for MRSA infections. Test performance is not FDA approved in patients less than 29 years old. Performed at [facility], [place] [address], [place], [place] [address]   Troponin I (High Sensitivity)     Status: Abnormal   Collection Time: 05/03/22 11:17 PM  Result Value Ref Range   Troponin I (High Sensitivity) 553 (HH) <18 ng/L    Comment: CRITICAL VALUE NOTED. VALUE IS CONSISTENT WITH PREVIOUSLY REPORTED/CALLED VALUE (NOTE) Elevated high sensitivity troponin I (hsTnI) values and significant  changes across serial measurements may suggest ACS but many other  chronic and acute conditions are known to elevate hsTnI results.  Refer to the "Links" section for chest pain algorithms and additional  guidance. Performed at [facility], [place] [address] [place], [place] [address]   Basic metabolic panel     Status: Abnormal   Collection Time: 05/04/22  4:12 AM  Result Value Ref Range   Sodium 137 135 -  145 mmol/L   Potassium 3.3 (L) 3.5 - 5.1 mmol/L   Chloride 102 98 - 111 mmol/L   CO2 20 (L) 22 - 32 mmol/L   Glucose, Bld 267 (H) 70 - 99 mg/dL    Comment: Glucose reference range applies  only to samples taken after fasting for at least 8 hours.   BUN 35 (H) 6 - 20 mg/dL   Creatinine, Ser 2.62 (H) 0.61 - 1.24 mg/dL   Calcium 8.7 (L) 8.9 - 10.3 mg/dL   GFR, Estimated 28 (L) >60 mL/min    Comment: (NOTE) Calculated using the CKD-EPI Creatinine Equation (2021)    Anion gap 15 5 - 15    Comment: Performed at [facility] [address], [place], [place] [address]  CBC     Status: Abnormal   Collection Time: 05/04/22  4:12 AM  Result Value Ref Range   WBC 16.8 (H) 4.0 - 10.5 K/uL   RBC 3.78 (L) 4.22 - 5.81 MIL/uL   Hemoglobin 11.6 (L) 13.0 - 17.0 g/dL   HCT 35.5 (L) 39.0 - 52.0 %   MCV 93.9 80.0 - 100.0 fL   MCH 30.7 26.0 - 34.0 pg   MCHC 32.7 30.0 - 36.0 g/dL   RDW 14.6 11.5 - 15.5 %   Platelets 199 150 - 400 K/uL   nRBC 0.0 0.0 - 0.2 %    Comment: Performed at [facility], [place] [address] [place], [place] [address]  Magnesium     Status: None   Collection Time: 05/04/22  4:12 AM  Result Value Ref Range   Magnesium 2.4 1.7 - 2.4 mg/dL    Comment: Performed at [facility] [address] [place],  [address]  Phosphorus     Status: Abnormal   Collection Time: 05/04/22  4:12 AM  Result Value Ref Range   Phosphorus 2.1 (L) 2.5 - 4.6 mg/dL    Comment: Performed at [facility] [address], [place],  [address]  Hemoglobin A1c     Status: Abnormal   Collection Time: 05/04/22  4:12 AM  Result Value Ref Range   Hgb A1c MFr Bld 10.2 (H) 4.8 - 5.6 %    Comment: (NOTE)         Prediabetes: 5.7 - 6.4         Diabetes: >6.4         Glycemic control for adults with diabetes: <7.0    Mean Plasma Glucose 246 mg/dL    Comment: (NOTE) Performed At: [facility] [place], [place] [id number] Rush Farmer MD [id number]   Glucose, capillary     Status: Abnormal   Collection Time: 05/04/22  4:33 AM  Result Value Ref Range   Glucose-Capillary 240 (H) 70 - 99 mg/dL    Comment: Glucose reference range  applies only to samples taken after fasting for at least 8 hours.  Glucose, capillary     Status: Abnormal   Collection Time: 05/04/22  8:39 AM  Result Value Ref Range   Glucose-Capillary 264 (H) 70 - 99 mg/dL    Comment: Glucose reference range applies only to samples taken after fasting for at least 8 hours.  Glucose, capillary     Status: Abnormal   Collection Time: 05/04/22 12:03 PM  Result Value Ref Range   Glucose-Capillary 242 (H) 70 - 99 mg/dL    Comment: Glucose reference range applies only to samples taken after fasting for at least  8 hours.  Potassium     Status: Abnormal   Collection Time: 05/04/22  4:10 PM  Result Value Ref Range   Potassium 3.1 (L) 3.5 - 5.1 mmol/L    Comment: Performed at [facility] [address], [place], [place] [address]  Glucose, capillary     Status: Abnormal   Collection Time: 05/04/22  5:12 PM  Result Value Ref Range   Glucose-Capillary 297 (H) 70 - 99 mg/dL    Comment: Glucose reference range applies only to samples taken after fasting for at least 8 hours.  Basic metabolic panel     Status: Abnormal   Collection Time: 05/04/22  6:55 PM  Result Value Ref Range   Sodium 131 (L) 135 - 145 mmol/L   Potassium 3.2 (L) 3.5 - 5.1 mmol/L   Chloride 95 (L) 98 - 111 mmol/L   CO2 21 (L) 22 - 32 mmol/L   Glucose, Bld 313 (H) 70 - 99 mg/dL    Comment: Glucose reference range applies only to samples taken after fasting for at least 8 hours.   BUN 40 (H) 6 - 20 mg/dL   Creatinine, Ser 3.04 (H) 0.61 - 1.24 mg/dL   Calcium 8.2 (L) 8.9 - 10.3 mg/dL   GFR, Estimated 23 (L) >60 mL/min    Comment: (NOTE) Calculated using the CKD-EPI Creatinine Equation (2021)    Anion gap 15 5 - 15    Comment: Performed at [facility] [address], [place], [place] [address]  Magnesium     Status: None   Collection Time: 05/04/22  6:55 PM  Result Value Ref Range   Magnesium 2.4 1.7 - 2.4 mg/dL    Comment: Performed at [facility], [place]  [address] [place], [place] [address]  Glucose, capillary     Status: Abnormal   Collection Time: 05/04/22  7:45 PM  Result Value Ref Range   Glucose-Capillary 293 (H) 70 - 99 mg/dL    Comment: Glucose reference range applies only to samples taken after fasting for at least 8 hours.  Phosphorus     Status: Abnormal   Collection Time: 05/04/22 10:10 PM  Result Value Ref Range   Phosphorus 2.0 (L) 2.5 - 4.6 mg/dL    Comment: Performed at [facility] [address], [place],  [address]  Basic metabolic panel     Status: Abnormal   Collection Time: 05/04/22 10:10 PM  Result Value Ref Range   Sodium 132 (L) 135 - 145 mmol/L   Potassium 3.1 (L) 3.5 - 5.1 mmol/L   Chloride 97 (L) 98 - 111 mmol/L   CO2 20 (L) 22 - 32 mmol/L   Glucose, Bld 229 (H) 70 - 99 mg/dL    Comment: Glucose reference range applies only to samples taken after fasting for at least 8 hours.   BUN 40 (H) 6 - 20 mg/dL   Creatinine, Ser 2.97 (H) 0.61 - 1.24 mg/dL   Calcium 8.2 (L) 8.9 - 10.3 mg/dL   GFR, Estimated 24 (L) >60 mL/min    Comment: (NOTE) Calculated using the CKD-EPI Creatinine Equation (2021)    Anion gap 15 5 - 15    Comment: Performed at [facility] [address], [place], [place] [address]  Glucose, capillary     Status: Abnormal   Collection Time: 05/04/22 11:44 PM  Result Value Ref Range   Glucose-Capillary 175 (H) 70 - 99 mg/dL    Comment: Glucose  reference range applies only to samples taken after fasting for at least 8 hours.  Glucose, capillary     Status: Abnormal   Collection Time: 05/05/22  4:44 AM  Result Value Ref Range   Glucose-Capillary 149 (H) 70 - 99 mg/dL    Comment: Glucose reference range applies only to samples taken after fasting for at least 8 hours.  Comprehensive metabolic panel     Status: Abnormal   Collection Time: 05/05/22  6:35 AM  Result Value Ref Range   Sodium 132 (L) 135 - 145 mmol/L   Potassium 3.1 (L) 3.5 - 5.1 mmol/L   Chloride 98 98 - 111 mmol/L    CO2 21 (L) 22 - 32 mmol/L   Glucose, Bld 123 (H) 70 - 99 mg/dL    Comment: Glucose reference range applies only to samples taken after fasting for at least 8 hours.   BUN 43 (H) 6 - 20 mg/dL   Creatinine, Ser 3.06 (H) 0.61 - 1.24 mg/dL   Calcium 8.0 (L) 8.9 - 10.3 mg/dL   Total Protein 6.7 6.5 - 8.1 g/dL   Albumin 2.6 (L) 3.5 - 5.0 g/dL   AST 18 15 - 41 U/L   ALT 13 0 - 44 U/L   Alkaline Phosphatase 51 38 - 126 U/L   Total Bilirubin 0.8 0.3 - 1.2 mg/dL   GFR, Estimated 23 (L) >60 mL/min    Comment: (NOTE) Calculated using the CKD-EPI Creatinine Equation (2021)    Anion gap 13 5 - 15    Comment: Performed at [facility], [place] [address] [place], [place] [address]  CBC with Differential/Platelet     Status: Abnormal   Collection Time: 05/05/22  6:35 AM  Result Value Ref Range   WBC 18.0 (H) 4.0 - 10.5 K/uL   RBC 4.06 (L) 4.22 - 5.81 MIL/uL   Hemoglobin 12.4 (L) 13.0 - 17.0 g/dL   HCT 35.9 (L) 39.0 - 52.0 %   MCV 88.4 80.0 - 100.0 fL   MCH 30.5 26.0 - 34.0 pg   MCHC 34.5 30.0 - 36.0 g/dL   RDW 14.6 11.5 - 15.5 %   Platelets 243 150 - 400 K/uL   nRBC 0.0 0.0 - 0.2 %   Neutrophils Relative % 85 %   Neutro Abs 15.3 (H) 1.7 - 7.7 K/uL   Lymphocytes Relative 9 %   Lymphs Abs 1.7 0.7 - 4.0 K/uL   Monocytes Relative 5 %   Monocytes Absolute 0.8 0.1 - 1.0 K/uL   Eosinophils Relative 0 %   Eosinophils Absolute 0.0 0.0 - 0.5 K/uL   Basophils Relative 0 %   Basophils Absolute 0.1 0.0 - 0.1 K/uL   WBC Morphology See Note    RBC Morphology MORPHOLOGY UNREMARKABLE    Smear Review Normal platelet morphology    Immature Granulocytes 1 %   Abs Immature Granulocytes 0.09 (H) 0.00 - 0.07 K/uL   Dohle Bodies PRESENT     Comment: Performed at [facility], [place] [address] [place], [place] [address]  Magnesium     Status: Abnormal   Collection Time: 05/05/22  6:35 AM  Result Value Ref Range   Magnesium 2.6 (H) 1.7 - 2.4 mg/dL    Comment: Performed at [facility] [address], [place],  [address]  Phosphorus     Status: None   Collection Time: 05/05/22  6:35 AM  Result Value Ref Range   Phosphorus 2.6 2.5 - 4.6  mg/dL    Comment: Performed at [facility], [place] [address], [place], [place] [address]  Glucose, capillary     Status: Abnormal   Collection Time: 05/05/22  8:29 AM  Result Value Ref Range   Glucose-Capillary 119 (H) 70 - 99 mg/dL    Comment: Glucose reference range applies only to samples taken after fasting for at least 8 hours.  Glucose, capillary     Status: Abnormal   Collection Time: 05/05/22 11:34 AM  Result Value Ref Range   Glucose-Capillary 154 (H) 70 - 99 mg/dL    Comment: Glucose reference range applies only to samples taken after fasting for at least 8 hours.   *Note: Due to a large number of results and/or encounters for the requested time period, some results have not been displayed. A complete set of results can be found in Results Review.   DG Chest Portable 1 View  Result Date: 05/03/2022 CLINICAL DATA:  dyspnea EXAM: PORTABLE CHEST 1 VIEW COMPARISON:  Chest x-ray 03/21/2022, CT chest 05/08/2004 FINDINGS: Left chest wall single lead cardiac defibrillator in grossly appropriate position. Stable enlarged cardiac silhouette. The heart and mediastinal contours are enlarged. Low lung volumes. No focal consolidation. No pulmonary edema. No pleural effusion. No pneumothorax. No acute osseous abnormality. IMPRESSION: Cardiomegaly with no active disease. Electronically Signed   By: Iven Finn M.D.   On: 05/03/2022 20:53      Assessment/Plan Right gluteal cellulitis and wound The patient has been seen, examined, labs, vitals, and chart reviewed.  He has a wound, but this has no drainage or fluctuance or induration.  However, more lateral to this, he has some more concerning findings.  No crepitus is present.  He will need a pelvic CT scan to evaluate this for abscess that is no visible on exam vs just  cellulitis.  We will transition him from augmentin to IV zosyn for right now.  We will follow up for further needs after scan is completed.  Hopefully he will not require surgical intervention given recent cardiac issues.   FEN - HH VTE - heparin, ASA ID - zosyn  Vtach - AICD in place CHF HTN DM obesity  I reviewed Consultant cardiology notes, hospitalist notes, last 24 h vitals and pain scores, last 48 h intake and output, last 24 h labs and trends, and last 24 h imaging results.  Henreitta Cea, PA-C [facility] 05/05/2022, 12:00 PM Please see Amion for pager number during day hours 7:00am-4:30pm or 7:00am -11:30am on weekends

## 2022-05-05 NOTE — Progress Notes (Addendum)
Inpatient Diabetes Program Recommendations  AACE/ADA: New Consensus Statement on Inpatient Glycemic Control (2015)  Target Ranges:  Prepandial:   less than 140 mg/dL      Peak postprandial:   less than 180 mg/dL (1-2 hours)      Critically ill patients:  140 - 180 mg/dL   Lab Results  Component Value Date   GLUCAP 119 (H) 05/05/2022   HGBA1C 10.2 (H) 05/04/2022    Review of Glycemic Control  Latest Reference Range & Units 05/04/22 19:45 05/04/22 23:44 05/05/22 04:44 05/05/22 08:29  Glucose-Capillary 70 - 99 mg/dL 293 (H) 175 (H) 149 (H) 119 (H)   Diabetes history: DM 2 Outpatient Diabetes medications: Lantus 30 units daily, Novolog 10 units bid, Trulicity 1.5 mg weekly, Farxiga 5 mg daily Current orders for Inpatient glycemic control:  Novolog 0-15 units q 4 hours Levemir 10 units bid  Inpatient Diabetes Program Recommendations:    Note that patient is followed at [facility].  Last visit with pharmacist was on 11/20.  He wears sensor, takes insulin, and Trulicity dose was increased.  Note that patient has c/o diarrhea.  May need to hold Trulicity for now until he follows back up with PCP.  Agree with current orders.  Of note, A1C has improved from 13 to 10.2%-  Will follow.   Thanks,  Adah Perl, RN, BC-ADM Inpatient Diabetes Coordinator Pager [phone]  (8a-5p)  13:30- Spoke with patient at bedside.  He states that he took increased dose of Trulicity on 97/41 and that diarrhea started the next day.  We discussed current A1C and need for improved glycemic control.  He states he has notified MD and [facility] regarding his admission.  Encouraged close follow-up.

## 2022-05-05 NOTE — Progress Notes (Signed)
No ICM remote transmission received for 05/05/2022 due to current hospitalization and next ICM transmission scheduled for 05/19/2022.

## 2022-05-05 NOTE — Progress Notes (Signed)
Rounding Note    Patient Name: [name] Date of Encounter: 05/05/2022  [place] Cardiologist: [name], MD   Subjective   Chronically short of breath but no change from baseline.  Patient has chest pain probably from CPR and continues to have diarrhea.  He did have 1 shock last night.  Inpatient Medications    Scheduled Meds:  aspirin EC  81 mg Oral Daily   atorvastatin  10 mg Oral Daily   Chlorhexidine Gluconate Cloth  6 each Topical Daily   heparin  5,000 Units Subcutaneous Q8H   insulin aspart  0-15 Units Subcutaneous Q4H   insulin detemir  10 Units Subcutaneous BID   potassium chloride  40 mEq Oral Q4H   Continuous Infusions:  amiodarone 30 mg/hr (05/05/22 0700)   norepinephrine (LEVOPHED) Adult infusion Stopped (05/04/22 2347)   PRN Meds: Gerhardt's butt cream, HYDROmorphone, levalbuterol, loperamide, mouth rinse, polyethylene glycol   Vital Signs    Vitals:   05/05/22 0455 05/05/22 0500 05/05/22 0620 05/05/22 0645  BP:    95/60  Pulse: 89  87 87  Resp: (!) 28  (!) 26 (!) 28  Temp:      TempSrc:      SpO2: 92%  93% 94%  Weight:  118.9 kg    Height:        Intake/Output Summary (Last 24 hours) at 05/05/2022 0756 Last data filed at 05/05/2022 0700 Gross per 24 hour  Intake 2023.82 ml  Output 1400 ml  Net 623.82 ml      05/05/2022    5:00 AM 05/04/2022    4:37 AM 05/03/2022    8:07 PM  Last 3 Weights  Weight (lbs) 262 lb 1.6 oz 257 lb 8 oz 249 lb  Weight (kg) 118.888 kg 116.8 kg 112.946 kg      Telemetry    Sinus rhythm.  No evidence of VT/VF- Personally Reviewed  ECG    Not performed today- Personally Reviewed  Physical Exam   GEN: No acute distress.   Neck: No JVD Cardiac: RRR, no murmurs, rubs, or gallops.  Respiratory: Clear to auscultation bilaterally. GI: Soft, nontender, non-distended  MS: No edema; No deformity. Neuro:  Nonfocal  Psych: Normal affect   Labs    High Sensitivity Troponin:   Recent  Labs  Lab 05/03/22 2010 05/03/22 2147 05/03/22 2317  TROPONINIHS 172* 399* 553*     Chemistry Recent Labs  Lab 05/03/22 2010 05/03/22 2025 05/04/22 0412 05/04/22 1610 05/04/22 1855 05/04/22 2210 05/05/22 0635  NA 138   < > 137  --  131* 132* 132*  K 2.8*   < > 3.3*   < > 3.2* 3.1* 3.1*  CL 99   < > 102  --  95* 97* 98  CO2 21*  --  20*  --  21* 20* 21*  GLUCOSE 244*   < > 267*  --  313* 229* 123*  BUN 32*   < > 35*  --  40* 40* 43*  CREATININE 2.84*   < > 2.62*  --  3.04* 2.97* 3.06*  CALCIUM 9.0  --  8.7*  --  8.2* 8.2* 8.0*  MG  --    < > 2.4  --  2.4  --  2.6*  PROT 7.6  --   --   --   --   --  6.7  ALBUMIN 3.4*  --   --   --   --   --  2.6*  AST 15  --   --   --   --   --  18  ALT 12  --   --   --   --   --  13  ALKPHOS 58  --   --   --   --   --  51  BILITOT 1.2  --   --   --   --   --  0.8  GFRNONAA 25*   < > 28*  --  23* 24* 23*  ANIONGAP 18*  --  15  --  15 15 13    < > = values in this interval not displayed.    Lipids No results for input(s): "CHOL", "TRIG", "HDL", "LABVLDL", "LDLCALC", "CHOLHDL" in the last 168 hours.  Hematology Recent Labs  Lab 05/03/22 2147 05/04/22 0412 05/05/22 0635  WBC 16.1* 16.8* 18.0*  RBC 4.07* 3.78* 4.06*  HGB 12.3* 11.6* 12.4*  HCT 37.3* 35.5* 35.9*  MCV 91.6 93.9 88.4  MCH 30.2 30.7 30.5  MCHC 33.0 32.7 34.5  RDW 14.5 14.6 14.6  PLT 246 199 243   Thyroid No results for input(s): "TSH", "FREET4" in the last 168 hours.  BNP Recent Labs  Lab 05/03/22 2010  BNP 350.8*    DDimer No results for input(s): "DDIMER" in the last 168 hours.   Radiology    DG Chest Portable 1 View  Result Date: 05/03/2022 CLINICAL DATA:  dyspnea EXAM: PORTABLE CHEST 1 VIEW COMPARISON:  Chest x-ray 03/21/2022, CT chest 05/08/2004 FINDINGS: Left chest wall single lead cardiac defibrillator in grossly appropriate position. Stable enlarged cardiac silhouette. The heart and mediastinal contours are enlarged. Low lung volumes. No focal  consolidation. No pulmonary edema. No pleural effusion. No pneumothorax. No acute osseous abnormality. IMPRESSION: Cardiomegaly with no active disease. Electronically Signed   By: Iven Finn M.D.   On: 05/03/2022 20:53    Cardiac Studies   2D echocardiogram (05/04/2022)  IMPRESSIONS     1. Left ventricular ejection fraction, by estimation, is 30%. The left  ventricle has moderately decreased function. The left ventricle  demonstrates global hypokinesis. The left ventricular internal cavity size  was severely dilated. There is mild left  ventricular hypertrophy. Left ventricular diastolic parameters are  consistent with Grade I diastolic dysfunction (impaired relaxation).   2. Right ventricular systolic function is normal. The right ventricular  size is normal.   3. The mitral valve is normal in structure. Mild mitral valve  regurgitation.   4. The aortic valve is tricuspid. Aortic valve regurgitation is not  visualized.   5. Aortic dilatation noted. There is mild dilatation of the aortic root,  measuring 41 mm.   Comparison(s): The left ventricular function is worsened.   Patient Profile     WAYLEN DEPAOLO is a 56 y.o. male with a hx of NICM/chronic systolic CHF s/p ICD, normal coronary arteries by cath 2015, GERD, HTN and DM2 who is being seen today for the evaluation of ventricular tachycardia/Vfib with multiple ICD shocks at the request of Dr. Matilde Sprang in [place].   Assessment & Plan    1: VT arrest-patient has nonischemic cardiomyopathy status post remote ICD implantation.  He has severe LV dysfunction with an EF in the 30% range.  Apparently his Trulicity was doubled which led to nausea vomiting diarrhea and subsequent hypokalemia and hypomagnesemia.  This was the substrate for ventricular tachyarrhythmias including VT/VF and multiple ICD discharges as well as brief CPR.  He has had no further  arrhythmias on telemetry.  His medications have been held because of "soft blood  pressure".  His K is being repleted although not as quickly as I would like.  His mag is normal at 2.6.  He still having diarrhea.  We will plan on increasing his K repletion up to greater than 4.  He is on IV amnio.  We will get EP involved for further recommendations.  2: Nonischemic cardiomyopathy-had cardiac catheterization to thousand 15 that showed normal coronary arteries.  He does have a ICD implanted.  He is followed by [name] and has been stable as an outpatient.  His medications have been held here in the hospital because of soft blood pressure.  We will consider restarting his home meds once he is ambulatory and is vital signs are more stable.  Given his baseline creatinine of 2 (now 3.0), not a candidate for ACE/ARB or Entresto.  3: Hypertension-off IV pressors.  Beta-blockers and diuretics on hold.  4: AKI-baseline creatinine is in the 2 range.  Currently at 3.  He is 2.5 L positive.  His diuretics are on hold.  I suspect this is from being relatively dry as well as potential component of ATN from hypotension and CPR  5: Diabetes-on sliding scale insulin.  Trulicity on hold.  Followed by PCCM.  Patient did have a shock last night.  His K3.1.  We will increase his potassium repletion.  Hopefully with gentle volume repletion his serum creatinine will slowly drift back down towards his baseline.  He still having diarrhea which we will address by adding loperamide.  We will get EP involved to have him weigh in on whether to continue amiodarone and transitioning from IV to p.o. versus stopping it once his electrolytes have been repleted and he said no further arrhythmias.  He will need to have EP follow-up arranged as an outpatient since his electrophysiology MD, Dr. Rayann Heman, has retired.     For questions or updates, please contact [place] Please consult [email or web address] for contact info under        Signed, Quay Burow, MD  05/05/2022, 7:56 AM

## 2022-05-05 NOTE — Progress Notes (Signed)
NAME:  [name], MRN:  [id number], DOB:  10/15/1965, LOS: 2 ADMISSION DATE:  05/03/2022, CONSULTATION DATE:  11/25 REFERRING MD:  [name], CHIEF COMPLAINT:  Diarrhea   History of Present Illness:  [name] is an 56 y.o. M who presented to the [facility] ED via EMS with a chief complaint of diarrhea and ICD shocks.  Patient dose of trulicity increased on 67/61. Reports since increase having diarrhea daily. Denies nausea, vomiting, BRBPR, melena. Patient reportedly with ICD shock on AM on 11/25. Unable to take in PO on 11/25. EMS called.  In the [facility] ED he had a witnessed VTACH arrest with CPR and ROSC achieved with one shock (30 seconds). Return to baseline neurological status. K 2.8, Creat 2.84 (baseline 1.8-2.3), WBC 19.5. Tmax 99.1. Troponin 172. He was given diludid for chest pain post CPR. He developed hypotension (74/40). Cardiology consulted.   PCCM consulted for admission.  Pertinent  Medical History  NICM, Systolic CHF s/p ICD placement, DM2, CKD stage 3, obesity, BPH, GERD, chronic LBP secondary to spondylosis and spinal stenosis.  Significant Hospital Events: Including procedures, antibiotic start and stop dates in addition to other pertinent events   11/25 Presented to [facility]  Interim History / Subjective:  Continues to have chest discomfort after CPR. Had one shock overnight. Also complains of buttock pain. On exam, has right gluteal cleft area of tenderness and induration with open area, per RN was draining earlier. Likely abscess.   Objective   Blood pressure 107/65, pulse 82, temperature 98.5 F (36.9 C), temperature source Oral, resp. rate (!) 34, height 6' (1.829 m), weight 118.9 kg, SpO2 93 %.        Intake/Output Summary (Last 24 hours) at 05/05/2022 0924 Last data filed at 05/05/2022 0900 Gross per 24 hour  Intake 1877.25 ml  Output 1050 ml  Net 827.25 ml    Filed Weights   05/03/22 2007 05/04/22 0437 05/05/22 0500  Weight: 112.9 kg 116.8 kg 118.9  kg    Examination: General: Adult male, resting in bed, in NAD. Neuro: A&O x 3, no deficits. HEENT: [place]/AT. Sclerae anicteric. EOMI. Cardiovascular: RRR, no M/R/G.  Lungs: Respirations even and unlabored.  CTA bilaterally, No W/R/R. Abdomen: BS x 4, soft, NT/ND.  Musculoskeletal: No gross deformities, no edema.  Skin: Right gluteal cleft area of induration and tenderness, presumed abscess. Intact, warm, no rashes.  Assessment & Plan:   VT cardiac arrest likely in the setting of hypokalemia.  Nonischemic cardiomyopathy with chronic HFrEF s/p AICD. Demand cardiac ischemia in the setting of frequent AICD firing. - Cardiology is following and will ask EP for input. - Continue K supplementation. - Continue amiodarone infusion.  Hypovolemic shock - resolved. Diarrhea, likely medication induced - Patient dose of trulicity increased on 95/09. Reports since increase having diarrhea daily up to 20 episodes per day. Diarrhea frequency improved since Trulicity has been stopped. - Encourage oral fluid intake. - Continue Loperamide.  AKI on CKD stage IIIb due to dehydration. Hypokalemia/Hypomagnesemia - being repleted.  - Continue aggressive electrolyte supplement and monitor.  Poorly controlled diabetes type 2 with hyperglycemia, complicated with diabetic neuropathy. - Continue SSI, Levemir. - Hold Trulicity.  Obesity. Dietitian consulted.  Right gluteal abscess. - Surgery consulted for I&D. - Start Augmentin.   Best Practice (right click and "Reselect all SmartList Selections" daily)   Diet/type: Heart healthy DVT prophylaxis: prophylactic heparin  GI prophylaxis: PPI Lines: N/A Foley:  N/A Code Status:  full code Last date of  multidisciplinary goals of care discussion [Full scope]  CC time: 35 min.   Montey Hora, [place] [facility] For pager details, please see AMION or use Epic chat  After 1900, please call [facility] for cross coverage  needs 05/05/2022, 9:37 AM

## 2022-05-06 ENCOUNTER — Inpatient Hospital Stay (HOSPITAL_COMMUNITY): Payer: Medicaid Other

## 2022-05-06 DIAGNOSIS — N1832 Chronic kidney disease, stage 3b: Secondary | ICD-10-CM | POA: Diagnosis not present

## 2022-05-06 DIAGNOSIS — N179 Acute kidney failure, unspecified: Secondary | ICD-10-CM

## 2022-05-06 DIAGNOSIS — L03317 Cellulitis of buttock: Secondary | ICD-10-CM

## 2022-05-06 DIAGNOSIS — R197 Diarrhea, unspecified: Secondary | ICD-10-CM | POA: Diagnosis not present

## 2022-05-06 DIAGNOSIS — I469 Cardiac arrest, cause unspecified: Secondary | ICD-10-CM | POA: Diagnosis not present

## 2022-05-06 DIAGNOSIS — I5022 Chronic systolic (congestive) heart failure: Secondary | ICD-10-CM

## 2022-05-06 DIAGNOSIS — I428 Other cardiomyopathies: Secondary | ICD-10-CM

## 2022-05-06 DIAGNOSIS — I5043 Acute on chronic combined systolic (congestive) and diastolic (congestive) heart failure: Secondary | ICD-10-CM

## 2022-05-06 DIAGNOSIS — I472 Ventricular tachycardia, unspecified: Secondary | ICD-10-CM | POA: Diagnosis not present

## 2022-05-06 DIAGNOSIS — I5023 Acute on chronic systolic (congestive) heart failure: Secondary | ICD-10-CM

## 2022-05-06 DIAGNOSIS — N1831 Chronic kidney disease, stage 3a: Secondary | ICD-10-CM

## 2022-05-06 DIAGNOSIS — R739 Hyperglycemia, unspecified: Secondary | ICD-10-CM

## 2022-05-06 LAB — GLUCOSE, CAPILLARY
Glucose-Capillary: 109 mg/dL — ABNORMAL HIGH (ref 70–99)
Glucose-Capillary: 155 mg/dL — ABNORMAL HIGH (ref 70–99)
Glucose-Capillary: 141 mg/dL — ABNORMAL HIGH (ref 70–99)
Glucose-Capillary: 172 mg/dL — ABNORMAL HIGH (ref 70–99)

## 2022-05-06 LAB — ECHOCARDIOGRAM COMPLETE
Area-P 1/2: 5.02 cm2
Height: 72 in
S' Lateral: 3.9 cm
Single Plane A4C EF: 40.6 %
Weight: 4193.6 oz

## 2022-05-06 LAB — CBC
HCT: 36.3 % — ABNORMAL LOW (ref 39.0–52.0)
Hemoglobin: 12.5 g/dL — ABNORMAL LOW (ref 13.0–17.0)
MCH: 30.6 pg (ref 26.0–34.0)
MCHC: 34.4 g/dL (ref 30.0–36.0)
MCV: 89 fL (ref 80.0–100.0)
Platelets: 247 10*3/uL (ref 150–400)
RBC: 4.08 MIL/uL — ABNORMAL LOW (ref 4.22–5.81)
RDW: 15 % (ref 11.5–15.5)
WBC: 14.6 10*3/uL — ABNORMAL HIGH (ref 4.0–10.5)
nRBC: 0 % (ref 0.0–0.2)

## 2022-05-06 LAB — COMPREHENSIVE METABOLIC PANEL
ALT: 30 U/L (ref 0–44)
AST: 40 U/L (ref 15–41)
Albumin: 2.5 g/dL — ABNORMAL LOW (ref 3.5–5.0)
Alkaline Phosphatase: 62 U/L (ref 38–126)
Anion gap: 13 (ref 5–15)
BUN: 39 mg/dL — ABNORMAL HIGH (ref 6–20)
CO2: 21 mmol/L — ABNORMAL LOW (ref 22–32)
Calcium: 8.5 mg/dL — ABNORMAL LOW (ref 8.9–10.3)
Chloride: 101 mmol/L (ref 98–111)
Creatinine, Ser: 2.86 mg/dL — ABNORMAL HIGH (ref 0.61–1.24)
GFR, Estimated: 25 mL/min — ABNORMAL LOW (ref >60)
Glucose, Bld: 163 mg/dL — ABNORMAL HIGH (ref 70–99)
Potassium: 3.9 mmol/L (ref 3.5–5.1)
Sodium: 135 mmol/L (ref 135–145)
Total Bilirubin: 1 mg/dL (ref 0.3–1.2)
Total Protein: 6.7 g/dL (ref 6.5–8.1)

## 2022-05-06 LAB — BRAIN NATRIURETIC PEPTIDE: B Natriuretic Peptide: 487.7 pg/mL — ABNORMAL HIGH (ref 0.0–100.0)

## 2022-05-06 LAB — MAGNESIUM: Magnesium: 2.6 mg/dL — ABNORMAL HIGH (ref 1.7–2.4)

## 2022-05-06 LAB — TSH: TSH: 1.156 u[IU]/mL (ref 0.350–4.500)

## 2022-05-06 LAB — LACTIC ACID, PLASMA: Lactic Acid, Venous: 1.1 mmol/L (ref 0.5–1.9)

## 2022-05-06 MED ORDER — LACTATED RINGERS IV SOLN: INTRAVENOUS | Status: AC

## 2022-05-06 MED ORDER — LOPERAMIDE HCL 2 MG PO CAPS
4.0000 mg | ORAL_CAPSULE | Freq: Once | ORAL | Status: AC
  Administered 2022-05-06: 4 mg via ORAL
  Filled 2022-05-06: qty 2

## 2022-05-06 MED ORDER — POTASSIUM CHLORIDE CRYS ER 20 MEQ PO TBCR
20.0000 meq | EXTENDED_RELEASE_TABLET | Freq: Once | ORAL | Status: AC
  Administered 2022-05-06: 20 meq via ORAL
  Filled 2022-05-06: qty 1

## 2022-05-06 MED ORDER — LOPERAMIDE HCL 2 MG PO CAPS: 2.0000 mg | ORAL_CAPSULE | ORAL | Status: DC | PRN

## 2022-05-06 MED ORDER — ACETAMINOPHEN 325 MG PO TABS
650.0000 mg | ORAL_TABLET | ORAL | Status: DC | PRN
  Administered 2022-05-06 – 2022-05-09 (×5): 650 mg via ORAL
  Filled 2022-05-06 (×5): qty 2

## 2022-05-06 NOTE — Consult Note (Addendum)
ELECTROPHYSIOLOGY CONSULT NOTE    Patient ID: [name] MRN: [id number], DOB/AGE: May 11, 1966 56 y.o.  Admit date: 05/03/2022 Date of Consult: 05/06/2022  Primary Physician: [name], MD Primary Cardiologist: [name], MD  Electrophysiologist: Previously Dr. Rayann Heman   Referring Provider: Dr. Gwenlyn Found  Patient Profile: [name] is a 56 y.o. male with a hx of NICM/chronic systolic CHF s/p ICD, normal coronary arteries by cath 2015, GERD, HTN and DM2 who is being seen today for the evaluation of ventricular tachycardia/Vfib with multiple ICD shocks at the request of Dr. Gwenlyn Found.    HPI:  [name] is a 56 y.o. male with medical history as above  Patient dose of trulicity increased on 66/06. Reported since increase having diarrhea daily. Denies nausea, vomiting, BRBPR, melena. Patient reportedly with ICD shock on AM on 11/25. Unable to take in PO on 11/25. EMS called.   In the [facility] ED he had a witnessed VTACH arrest with CPR and ROSC achieved with one shock (30 seconds). Return to baseline neurological status. K 2.8, Creat 2.84 (baseline 1.8-2.3), WBC 19.5. Tmax 99.1. Troponin 172. He was given diludid for chest pain post CPR. He developed hypotension (74/40). Cardiology consulted and have been following.   Potassium4.5 (11/27 1533) Magnesium  2.6* (11/27 0045) Creatinine, ser  3.08* (11/27 1533) PLT  243 (11/27 0635) HGB  12.4* (11/27 0635) WBC 18.0* (11/27 9977) Troponin I (High Sensitivity)553* (11/25 2317).    VT has been quiescent with restoration of his electrolytes, management of his other issues, and IV amiodarone. EP consulted for further recommendations.   Pt is feeling OK at rest today. Bottom is very sore and uncomfortable. Gen surgery following. Reports taking all medications PTA as directed, and being USOH up until his DM medications were changed. No CP or SOB currently.    Past Medical History:  Diagnosis Date   AICD (automatic  cardioverter/defibrillator) present    Chronic bronchitis ([place])    "get it most q yr" (12/23/2013)   Chronic systolic (congestive) heart failure ([place])    Fracture of left humerus    a. 07/2013.   GERD (gastroesophageal reflux disease)    not at present time   Heart murmur    "born w/it"    History of renal calculi    Hypertension    Kidney stones    NICM (nonischemic cardiomyopathy) ([place])    a. 07/2013 Echo: EF 20-25%, diff HK, Gr2 DD, mild MR, mod dil LA/RA. 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    Shockable heart rhythm detected by automated external defibrillator    Syncope    Type II diabetes mellitus ([place]) 2006     Surgical History:  Past Surgical History:  Procedure Laterality Date   CARDIAC CATHETERIZATION  08/10/13   CHOLECYSTECTOMY  ~ 2010   IMPLANTABLE CARDIOVERTER DEFIBRILLATOR IMPLANT  12/23/2013   STJ Fortify ICD implanted by [name] for cardiomyopathy and syncope   IMPLANTABLE CARDIOVERTER DEFIBRILLATOR IMPLANT N/A 12/23/2013   Procedure: IMPLANTABLE CARDIOVERTER DEFIBRILLATOR IMPLANT;  Surgeon: Coralyn Mark, MD;  Location: [facility] CATH LAB;  Service: Cardiovascular;  Laterality: N/A;   INGUINAL HERNIA REPAIR Left 03/01/2018   Procedure: OPEN REPAIR OF LEFT INGUINAL HERNIA WITH MESH;  Surgeon: Greer Pickerel, MD;  Location: [facility] ORS;  Service: General;  Laterality: Left;   LEFT HEART CATHETERIZATION WITH CORONARY ANGIOGRAM N/A 08/10/2013   Procedure: LEFT HEART CATHETERIZATION  WITH CORONARY ANGIOGRAM;  Surgeon: Jettie Booze, MD;  Location: [facility] CATH LAB;  Service: Cardiovascular;  Laterality: N/A;   ORIF HUMERUS FRACTURE Left 08/12/2013   Procedure: OPEN REDUCTION INTERNAL FIXATION (ORIF) HUMERAL SHAFT FRACTURE;  Surgeon: Newt Minion, MD;  Location: [place];  Service: Orthopedics;  Laterality: Left;  Open Reduction Internal Fixation Left Humerus   URETEROSCOPY     "laser for kidney stones"      Medications Prior to Admission  Medication Sig Dispense Refill Last Dose   albuterol (VENTOLIN HFA) 108 (90 Base) MCG/ACT inhaler Inhale into the lungs every 6 (six) hours as needed for wheezing or shortness of breath.   05/02/2022   aspirin 81 MG tablet Take 81 mg by mouth daily.   05/02/2022   atorvastatin (LIPITOR) 10 MG tablet Take 1 tablet (10 mg total) by mouth daily. 90 tablet 1 05/02/2022   carvedilol (COREG) 6.25 MG tablet Take 1 tablet (6.25 mg total) by mouth 2 (two) times daily. 60 tablet 7 05/02/2022 at 0900   cetirizine (ZYRTEC) 10 MG tablet Take 1 tablet (10 mg total) by mouth daily. 90 tablet 1 UNKNOWN   dapagliflozin propanediol (FARXIGA) 5 MG TABS tablet Take 1 tablet (5 mg total) by mouth daily before breakfast. 90 tablet 1 05/02/2022   Dulaglutide (TRULICITY) 1.5 RC/7.8LF SOPN Inject 1.5 mg into the skin once a week. 2 mL 3 05/01/2022   furosemide (LASIX) 80 MG tablet Take 1 tablet (80 mg total) by mouth 2 (two) times daily. 180 tablet 1 05/02/2022 at am   gabapentin (NEURONTIN) 300 MG capsule TAKE 1 CAPSULE BY MOUTH EVERY DAY AT BEDTIME (Patient taking differently: Take 300 mg by mouth at bedtime.) 90 capsule 1 05/02/2022 at pm   insulin aspart (NOVOLOG FLEXPEN) 100 UNIT/ML FlexPen Inject 10 Units into the skin 2 (two) times daily before a meal. 15 mL 2 05/02/2022 at am   insulin glargine (LANTUS SOLOSTAR) 100 UNIT/ML Solostar Pen Inject 30 Units into the skin daily. 15 mL 1 05/01/2022   ipratropium-albuterol (DUONEB) 0.5-2.5 (3) MG/3ML SOLN Take 3 mLs by nebulization every 6 (six) hours as needed. (Patient taking differently: Take 3 mLs by nebulization every 6 (six) hours as needed (for wheezing or shortness of breath).) 360 mL 2 Past Week   potassium chloride (KLOR-CON) 10 MEQ tablet Take 10 mEq by mouth daily.   05/02/2022   sacubitril-valsartan (ENTRESTO) 24-26 MG Take 1 tablet by mouth 2 (two) times daily. 180 tablet 1 05/02/2022   tamsulosin (FLOMAX) 0.4 MG CAPS capsule  Take 2 capsules (0.8 mg total) by mouth daily. 180 capsule 1 05/02/2022   Accu-Chek Softclix Lancets lancets Use as instructed 100 each 12    Blood Glucose Monitoring Suppl (ACCU-CHEK GUIDE) w/Device KIT UAD 1 kit 0    chlorpheniramine-HYDROcodone (TUSSIONEX) 10-8 MG/5ML Take 5 mLs by mouth every 12 (twelve) hours as needed for cough. 115 mL 0 UNKNOWN   clobetasol ointment (TEMOVATE) 8.10 % Apply 1 Application topically 2 (two) times daily. 30 g 3 UNKNOWN   Continuous Blood Gluc Receiver (FREESTYLE LIBRE 2 READER) DEVI Use to check blood sugar TID.  E11.65 1 each 0    Continuous Blood Gluc Sensor (FREESTYLE LIBRE 2 SENSOR) MISC Use to check blood sugar TID. Change sensor once every 14 days. E11.65 2 each 3    doxycycline (VIBRA-TABS) 100 MG tablet Take 1 tablet (100 mg total) by mouth 2 (two) times daily. (Patient not taking: Reported on 05/03/2022) 10 tablet 0 Completed Course  EPINEPHrine 0.3 mg/0.3 mL IJ SOAJ injection Inject 0.3 mg into the muscle as needed. 1 each 1 UNKNOWN   famotidine (PEPCID) 20 MG tablet Take 1 tablet by mouth 1-2 times a day as needed. (Patient taking differently: Take 20 mg by mouth See admin instructions. Take 1 tablet by mouth 1-2 times a day as needed for heartburn or indigestion) 90 tablet 1 UNKNOWN   fluticasone (FLONASE) 50 MCG/ACT nasal spray Use 1-2 sprays in each nostril once a day as needed for nasal congestion. 16 g 5 UNKNOWN   glucose blood (ACCU-CHEK GUIDE) test strip Use as instructed 100 each 12    Insulin Pen Needle (PEN NEEDLES) 31G X 8 MM MISC UAD 100 each 6    Lancet Devices (ACCU-CHEK SOFTCLIX) lancets 1 each by Other route 3 (three) times daily. 1 each 0    methocarbamol (ROBAXIN) 500 MG tablet TAKE 1 TABLET BY MOUTH ONCE DAILY AS NEEDED FOR MUSCLE SPASM (Patient taking differently: Take 500 mg by mouth daily as needed for muscle spasms.) 30 tablet 0 UNKNOWN   mometasone (ELOCON) 0.1 % cream Apply 1 application topically daily. 45 g 1 UNKNOWN    naproxen (NAPROSYN) 500 MG tablet Take 1 tablet (500 mg total) by mouth 2 (two) times daily with a meal. 30 tablet 0 UNKNOWN   promethazine-dextromethorphan (PROMETHAZINE-DM) 6.25-15 MG/5ML syrup Take 5 mLs by mouth 4 (four) times daily as needed for cough. 180 mL 0 UNKNOWN   sildenafil (VIAGRA) 100 MG tablet Take 1-2 tabs PO 1/2-1 hr prior to intercourse. 20 tablet 4 UNKNOWN   triamcinolone cream (KENALOG) 0.1 % Apply sparingly to red itchy areas twice daily as needed. Avoid face, neck, armpits or groin area. Do not use more than 3 weeks in a row. 45 g 5 UNKNOWN    Inpatient Medications:   aspirin EC  81 mg Oral Daily   atorvastatin  10 mg Oral Daily   Chlorhexidine Gluconate Cloth  6 each Topical Daily   heparin  5,000 Units Subcutaneous Q8H   insulin aspart  0-15 Units Subcutaneous Q4H   insulin detemir  10 Units Subcutaneous BID   tamsulosin  0.4 mg Oral Daily    Allergies:  Allergies  Allergen Reactions   Bee Venom Anaphylaxis    Social History   Socioeconomic History   Marital status: Single    Spouse name: Not on file   Number of children: Not on file   Years of education: Not on file   Highest education level: Not on file  Occupational History   Occupation: Disabled  Tobacco Use   Smoking status: Former    Packs/day: 1.00    Years: 28.00    Total pack years: 28.00    Types: Cigarettes    Quit date: 06/03/2014    Years since quitting: 7.9   Smokeless tobacco: Never  Vaping Use   Vaping Use: Never used  Substance and Sexual Activity   Alcohol use: Yes   Drug use: Yes    Types: Marijuana    Comment: occasionally   Sexual activity: Yes  Other Topics Concern   Not on file  Social History Narrative   Pt lives with his brother    Social Determinants of Health   Financial Resource Strain: Not on file  Food Insecurity: No Food Insecurity (12/11/2020)   Hunger Vital Sign    Worried About Running Out of Food in the Last Year: Never true    Ran Out of Food in the  Last  Year: Never true  Transportation Needs: No Transportation Needs (05/06/2022)   PRAPARE - Hydrologist (Medical): No    Lack of Transportation (Non-Medical): No  Physical Activity: Not on file  Stress: Not on file  Social Connections: Moderately Integrated (03/12/2021)   Social Connection and Isolation Panel [NHANES]    Frequency of Communication with Friends and Family: More than three times a week    Frequency of Social Gatherings with Friends and Family: More than three times a week    Attends Religious Services: More than 4 times per year    Active Member of [employer] or Organizations: Yes    Attends Archivist Meetings: Never    Marital Status: Never married  Intimate Partner Violence: Not At Risk (05/06/2022)   Humiliation, Afraid, Rape, and Kick questionnaire    Fear of Current or Ex-Partner: No    Emotionally Abused: No    Physically Abused: No    Sexually Abused: No     Family History  Problem Relation Age of Onset   Diabetes Father    Arthritis Father    Hypertension Father    Diabetes Mother    Arthritis Mother    Hypertension Mother    Hypertension Brother      Review of Systems: All other systems reviewed and are otherwise negative except as noted above.  Physical Exam: Vitals:   05/06/22 0400 05/06/22 0600 05/06/22 0700 05/06/22 0751  BP: 106/64 113/75 103/65   Pulse: 85 84 72   Resp: (!) 23 (!) 31 (!) 26   Temp:    98.7 F (37.1 C)  TempSrc:    Oral  SpO2: 95% 93% 93%   Weight:      Height:        GEN- The patient is well appearing, alert and oriented x 3 today.   HEENT: normocephalic, atraumatic; sclera clear, conjunctiva pink; hearing intact; oropharynx clear; neck supple Lungs- Clear to ausculation bilaterally, normal work of breathing.  No wheezes, rales, rhonchi Heart- Regular rate and rhythm, no murmurs, rubs or gallops GI- soft, non-tender, non-distended, bowel sounds present Extremities- no clubbing,  cyanosis, or edema; DP/PT/radial pulses 2+ bilaterally MS- no significant deformity or atrophy Skin- warm and dry, no rash or lesion Psych- euthymic mood, full affect Neuro- strength and sensation are intact  Labs:   Lab Results  Component Value Date   WBC 18.0 (H) 05/05/2022   HGB 12.4 (L) 05/05/2022   HCT 35.9 (L) 05/05/2022   MCV 88.4 05/05/2022   PLT 243 05/05/2022    Recent Labs  Lab 05/05/22 0635 05/05/22 1533  NA 132* 136  K 3.1* 4.5  CL 98 101  CO2 21* 20*  BUN 43* 45*  CREATININE 3.06* 3.08*  CALCIUM 8.0* 8.6*  PROT 6.7  --   BILITOT 0.8  --   ALKPHOS 51  --   ALT 13  --   AST 18  --   GLUCOSE 123* 131*      Radiology/Studies: CT PELVIS WO CONTRAST  Result Date: 05/05/2022 CLINICAL DATA:  Soft tissue infection suspected, pelvis, xray done right gluteal edema, cellulitis, eval for infection EXAM: CT PELVIS WITHOUT CONTRAST TECHNIQUE: Multidetector CT imaging of the pelvis was performed following the standard protocol without intravenous contrast. RADIATION DOSE REDUCTION: This exam was performed according to the departmental dose-optimization program which includes automated exposure control, adjustment of the mA and/or kV according to patient size and/or use of iterative reconstruction technique. COMPARISON:  None Available. FINDINGS: Urinary Tract:  No abnormality visualized. Bowel: Scattered colonic diverticulosis. Otherwise unremarkable visualized pelvic bowel loops. Vascular/Lymphatic: Atherosclerotic plaque. No pathologically enlarged lymph nodes. No significant vascular abnormality seen. Reproductive: Prostate measures 4.8 cm. No mass or other significant abnormality Other: No intraperitoneal free fluid. No intraperitoneal free gas. No organized fluid collection. Musculoskeletal: Diffuse subcutaneus soft tissue edema of the right gluteal soft tissues with no emphysema or organized fluid collection. Associated slight haziness of the right gluteal maximus. Slight  extension of the fat stranding to the perineal region and possible right scrotum. Some mild stranding of the left gluteal soft tissues may be normal. No cortical erosion or destruction. No suspicious bone lesions identified. No acute displaced fracture or dislocation. At least mild degenerative changes of the bilateral hips. IMPRESSION: 1. Diffuse subcutaneus soft tissue edema of the right gluteal soft tissues with no emphysema or organized fluid collection. Associated slight haziness of the right gluteal maximus suggestive of possible developing associated myositis. Slight extension of the fat stranding to the perineal region and possible right scrotum. Please note a necrotizing fasciitis cannot be excluded as this is a clinical diagnosis. 2. Other imaging findings of potential clinical significance: Colonic diverticulosis with no acute diverticulitis. Electronically Signed   By: Iven Finn M.D.   On: 05/05/2022 20:37   DG Chest Portable 1 View  Result Date: 05/03/2022 CLINICAL DATA:  dyspnea EXAM: PORTABLE CHEST 1 VIEW COMPARISON:  Chest x-ray 03/21/2022, CT chest 05/08/2004 FINDINGS: Left chest wall single lead cardiac defibrillator in grossly appropriate position. Stable enlarged cardiac silhouette. The heart and mediastinal contours are enlarged. Low lung volumes. No focal consolidation. No pulmonary edema. No pleural effusion. No pneumothorax. No acute osseous abnormality. IMPRESSION: Cardiomegaly with no active disease. Electronically Signed   By: Iven Finn M.D.   On: 05/03/2022 20:53   CUP PACEART REMOTE DEVICE CHECK  Result Date: 04/15/2022 Scheduled remote reviewed. Normal device function.  Next remote 91 days- JJB   EKG: 11/25 shows sinus tachycardia and then VT/VF (personally reviewed)  TELEMETRY: NSR 70-80s, no further VT since 11/25 (personally reviewed)    DEVICE HISTORY:  St. Jude Single Chamber ICD implanted 12/2013 for CHF   Assessment/Plan: 1.  VT storm Pt had 22  ICD shocks and 2 ATP, 10 aborted shocks.  Likely in setting of hypokalemia and infection.  Potassium4.5 (11/27 1533) Magnesium  2.6* (11/27 4098) Creatinine, ser  3.08* (11/27 1533) Keep K > 4.0 and Mg > 2.0  VT has been quiescent since 11/25 (Had PMVT).  Since was PMVT and most likely caused by Hypokalemia. Per Dr. Curt Bears can stop amiodarone at this point.  If has further VT, would resume amio.   2. Chronic systolic CHF 3. NICM Echo 01/2021 LVEF 30% -> Update  Home GDMT on hold with diarrhea and AKI Given his young age, He would likely benefit from HF team consultation.   4. Diarrhea 5. Right gluteal cellulitis and wound Per primary Gen surgery following. On IV ABx  5. AKI on CKD III Baseline Cr 1.9-2.1 Cr 3.08 yesterday. Pending today.    For questions or updates, please contact [place] Please consult [email or web address] for contact info under Cardiology/STEMI.  Signed, Shirley Friar, PA-C  05/06/2022 9:10 AM    I have seen and examined this patient with Oda Kilts.  Agree with above, note added to reflect my findings.  Patient presented with VT storm. Had recently had GI issues with diarrhea. Presented to the ER with ICD shocks.  Was found to have K of 2.8. has a history of CHF due to NICM. With K repletion has had no further VT episodes.  GEN: Well nourished, well developed, in no acute distress  HEENT: normal  Neck: no JVD, carotid bruits, or masses Cardiac: RRR; no murmurs, rubs, or gallops,no edema  Respiratory:  clear to auscultation bilaterally, normal work of breathing GI: soft, nontender, nondistended, + BS MS: no deformity or atrophy  Skin: warm and dry, device site well healed Neuro:  Strength and sensation are intact Psych: euthymic mood, full affect   VT storm: likely due to low K from diarrhea. With repetion of K, no further VT noted. Patient on IV amiodarone. Rubena Roseman stop as he has had no further arrhythmias.  Chronic systolic HF:  therapy on  hold with diarrhea. No obvious volume overload. Plan per gen cardiology. Acute on chronic renal failure IIIb: likely due to dehydration. Plan per primary team. Hypokalemia/hypomag: resolved with supplementation  Chanetta Moosman M. Sreeja Spies MD 05/06/2022 1:28 PM

## 2022-05-06 NOTE — Consult Note (Signed)
Advanced Heart Failure Team Consult Note   Primary Physician: [name], MD PCP-Cardiologist:  [name], MD  Reason for Consultation: GDMT management for systolic heart failure in the setting of soft BP and worsening renal failure  HPI:    [name] is seen today for evaluation of GDMT management for systolic heart failure in the setting of soft BP and worsening renal failure, at the request of Dr. Gwenlyn Found, Cardiology.   56 y/o AAM w/ h/o chronic systolic heart failure due to NICM.   Echo 07/2013 EF 20-25%. RV normal. LHC normal cors.  Repeat Echo 5/15 EF 30-35%  7/15 ICD placed.   Last Echo 8/22, EF 30%, RV normal.   PCP recently increased Trulicity on 17/35. Subsequently developed severe n/v/d. Presented to ED on 11/15 by EMS w/ VT storm. Multiple ICD shocks at home and in the field by EMS for PMVT. In ED, had witnessed VT arrest with CPR and ROSC with 1 shock. In total, pt had 22 ICD shocks and 2 ATP, 10 aborted shocks.  Labs showed K+ 2.8, SCr 2.84 (baseline 1.8-2.3), WBC 19.5 and hsTrop 172. He also had low grade fever 99.1.  Admitted by critical care. EP and cardiology teams consulted. Received aggressive K supp and started on amio gtt.   VT has been quiescent since 11/25 w/ repletion of K. EP plans to stop amiodarone today.   Also of note, pt found to have rt gluteal cellulitis and wound. Gen surgery following. CT scan showed no drainable abscess.  Planing treatment w/ abx. On Zosyn.   He continues to have diarrhea. C-diff negative. SCr 3.08 (prior b/l ~2.0). SBPs 90s-low 100s. Off all GDMT. Denies resting dyspnea but reports 2 pillow orthopnea and chronic NYHA Class III symptoms w/ activity. + chronic fatigue.   2D Echo pending    Review of Systems: [y] = yes, _0  = no   General: Weight gain _1 ; Weight loss _2 ; Anorexia _3 ; Fatigue _4 ; Fever _5 ; Chills _6 ; Weakness _7   Cardiac: Chest pain/pressure _8 ; Resting SOB _9 ; Exertional SOB _10 ;  Orthopnea _11 ; Pedal Edema _12 ; Palpitations _13 ; Syncope _14 ; Presyncope _15 ; Paroxysmal nocturnal dyspnea_16   Pulmonary: Cough _17 ; Wheezing_18 ; Hemoptysis_19 ; Sputum _20 ; Snoring _21   GI: Vomiting_22 ; Dysphagia_23 ; Melena_24 ; Hematochezia _25 ; Heartburn_26 ; Abdominal pain _27 ; Constipation _28 ; Diarrhea _29 ; BRBPR _30   GU: Hematuria_31 ; Dysuria _32 ; Nocturia_33   Vascular: Pain in legs with walking _34 ; Pain in feet with lying flat _35 ; Non-healing sores _36 ; Stroke _37 ; TIA _38 ; Slurred speech _39 ;  Neuro: Headaches_40 ; Vertigo_41 ; Seizures_42 ; Paresthesias_43 ;Blurred vision _44 ; Diplopia _45 ; Vision changes _46   Ortho/Skin: Arthritis _47 ; Joint pain _48 ; Muscle pain _49 ; Joint swelling _50 ; Back Pain _51 ; Rash _52   Psych: Depression_53 ; Anxiety_54   Heme: Bleeding problems _55 ; Clotting disorders _56 ; Anemia _57   Endocrine: Diabetes _58 ; Thyroid dysfunction_59   Home Medications Prior to Admission medications   Medication Sig Start Date End Date Taking? Authorizing Provider  albuterol (VENTOLIN HFA) 108 (90 Base) MCG/ACT inhaler Inhale into the lungs every 6 (six) hours as needed for wheezing or shortness of breath.   Yes [provider]  aspirin 81 MG tablet Take 81 mg by mouth  daily.   Yes [provider]  atorvastatin (LIPITOR) 10 MG tablet Take 1 tablet (10 mg total) by mouth daily. 01/19/22  Yes Elsie Stain, MD  carvedilol (COREG) 6.25 MG tablet Take 1 tablet (6.25 mg total) by mouth 2 (two) times daily. 09/19/21  Yes Lelon Perla, MD  cetirizine (ZYRTEC) 10 MG tablet Take 1 tablet (10 mg total) by mouth daily. 01/30/22  Yes Valentina Shaggy, MD  dapagliflozin propanediol (FARXIGA) 5 MG TABS tablet Take 1 tablet (5 mg total) by mouth daily before breakfast. 01/19/22  Yes Elsie Stain, MD  Dulaglutide (TRULICITY) 1.5 OJ/5.0KX SOPN Inject 1.5 mg into the skin once a week. 04/28/22  Yes [name], MD  furosemide (LASIX) 80 MG tablet Take 1 tablet  (80 mg total) by mouth 2 (two) times daily. 04/21/22  Yes Lelon Perla, MD  gabapentin (NEURONTIN) 300 MG capsule TAKE 1 CAPSULE BY MOUTH EVERY DAY AT BEDTIME Patient taking differently: Take 300 mg by mouth at bedtime. 10/05/20  Yes [name], MD  insulin aspart (NOVOLOG FLEXPEN) 100 UNIT/ML FlexPen Inject 10 Units into the skin 2 (two) times daily before a meal. 04/07/22  Yes [name], MD  insulin glargine (LANTUS SOLOSTAR) 100 UNIT/ML Solostar Pen Inject 30 Units into the skin daily. 03/21/22  Yes [name], MD  ipratropium-albuterol (DUONEB) 0.5-2.5 (3) MG/3ML SOLN Take 3 mLs by nebulization every 6 (six) hours as needed. Patient taking differently: Take 3 mLs by nebulization every 6 (six) hours as needed (for wheezing or shortness of breath). 03/21/22  Yes Juanito Doom, MD  potassium chloride (KLOR-CON) 10 MEQ tablet Take 10 mEq by mouth daily. 11/02/20  Yes [provider]  sacubitril-valsartan (ENTRESTO) 24-26 MG Take 1 tablet by mouth 2 (two) times daily. 04/21/22  Yes Lelon Perla, MD  tamsulosin (FLOMAX) 0.4 MG CAPS capsule Take 2 capsules (0.8 mg total) by mouth daily. 01/19/22  Yes Elsie Stain, MD  Accu-Chek Softclix Lancets lancets Use as instructed 02/01/22   Cristie Hem, MD  Blood Glucose Monitoring Suppl (ACCU-CHEK GUIDE) w/Device KIT UAD 02/01/22   Cristie Hem, MD  chlorpheniramine-HYDROcodone (TUSSIONEX) 10-8 MG/5ML Take 5 mLs by mouth every 12 (twelve) hours as needed for cough. 03/21/22   Juanito Doom, MD  clobetasol ointment (TEMOVATE) 3.81 % Apply 1 Application topically 2 (two) times daily. 01/30/22   Valentina Shaggy, MD  Continuous Blood Gluc Receiver (FREESTYLE LIBRE 2 READER) DEVI Use to check blood sugar TID.  E11.65 03/06/22   [name], MD  Continuous Blood Gluc Sensor (FREESTYLE LIBRE 2 SENSOR) MISC Use to check blood sugar TID. Change sensor once every 14 days. E11.65 03/06/22    [name], MD  doxycycline (VIBRA-TABS) 100 MG tablet Take 1 tablet (100 mg total) by mouth 2 (two) times daily. Patient not taking: Reported on 05/03/2022 03/21/22   Simonne Maffucci B, MD  EPINEPHrine 0.3 mg/0.3 mL IJ SOAJ injection Inject 0.3 mg into the muscle as needed. 01/30/22   Valentina Shaggy, MD  famotidine (PEPCID) 20 MG tablet Take 1 tablet by mouth 1-2 times a day as needed. Patient taking differently: Take 20 mg by mouth See admin instructions. Take 1 tablet by mouth 1-2 times a day as needed for heartburn or indigestion 02/18/22   Valentina Shaggy, MD  fluticasone [facility]) 50 MCG/ACT nasal spray Use 1-2 sprays in each nostril once a day as needed for nasal congestion.  01/30/22   Valentina Shaggy, MD  glucose blood (ACCU-CHEK GUIDE) test strip Use as instructed 02/01/22   Cristie Hem, MD  Insulin Pen Needle (PEN NEEDLES) 31G X 8 MM MISC UAD 03/01/22   [name], MD  Lancet Devices [facility]) lancets 1 each by Other route 3 (three) times daily. 10/20/14   Funches, Adriana Mccallum, MD  methocarbamol (ROBAXIN) 500 MG tablet TAKE 1 TABLET BY MOUTH ONCE DAILY AS NEEDED FOR MUSCLE SPASM Patient taking differently: Take 500 mg by mouth daily as needed for muscle spasms. 04/25/22   Rodena Goldmann A, DO  mometasone (ELOCON) 0.1 % cream Apply 1 application topically daily. 06/17/19   Marzetta Board, DPM  naproxen (NAPROSYN) 500 MG tablet Take 1 tablet (500 mg total) by mouth 2 (two) times daily with a meal. 04/05/22   Antonietta Breach, PA-C  promethazine-dextromethorphan (PROMETHAZINE-DM) 6.25-15 MG/5ML syrup Take 5 mLs by mouth 4 (four) times daily as needed for cough. 02/18/22   Elsie Stain, MD  sildenafil (VIAGRA) 100 MG tablet Take 1-2 tabs PO 1/2-1 hr prior to intercourse. 12/14/20   [name], MD  triamcinolone cream (KENALOG) 0.1 % Apply sparingly to red itchy areas twice daily as needed. Avoid face, neck, armpits or groin area. Do not  use more than 3 weeks in a row. 01/30/22   Valentina Shaggy, MD    Past Medical History: Past Medical History:  Diagnosis Date   AICD (automatic cardioverter/defibrillator) present    Chronic bronchitis ([place])    "get it most q yr" (12/23/2013)   Chronic systolic (congestive) heart failure ([place])    Fracture of left humerus    a. 07/2013.   GERD (gastroesophageal reflux disease)    not at present time   Heart murmur    "born w/it"    History of renal calculi    Hypertension    Kidney stones    NICM (nonischemic cardiomyopathy) ([place])    a. 07/2013 Echo: EF 20-25%, diff HK, Gr2 DD, mild MR, mod dil LA/RA. 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    Shockable heart rhythm detected by automated external defibrillator    Syncope    Type II diabetes mellitus ([place]) 2006    Past Surgical History: Past Surgical History:  Procedure Laterality Date   CARDIAC CATHETERIZATION  08/10/13   CHOLECYSTECTOMY  ~ 2010   IMPLANTABLE CARDIOVERTER DEFIBRILLATOR IMPLANT  12/23/2013   STJ Fortify ICD implanted by [name] for cardiomyopathy and syncope   IMPLANTABLE CARDIOVERTER DEFIBRILLATOR IMPLANT N/A 12/23/2013   Procedure: IMPLANTABLE CARDIOVERTER DEFIBRILLATOR IMPLANT;  Surgeon: Coralyn Mark, MD;  Location: [facility] CATH LAB;  Service: Cardiovascular;  Laterality: N/A;   INGUINAL HERNIA REPAIR Left 03/01/2018   Procedure: OPEN REPAIR OF LEFT INGUINAL HERNIA WITH MESH;  Surgeon: Greer Pickerel, MD;  Location: [facility] ORS;  Service: General;  Laterality: Left;   LEFT HEART CATHETERIZATION WITH CORONARY ANGIOGRAM N/A 08/10/2013   Procedure: LEFT HEART CATHETERIZATION WITH CORONARY ANGIOGRAM;  Surgeon: Jettie Booze, MD;  Location: [facility] CATH LAB;  Service: Cardiovascular;  Laterality: N/A;   ORIF HUMERUS FRACTURE Left 08/12/2013   Procedure: OPEN REDUCTION INTERNAL FIXATION (ORIF) HUMERAL SHAFT FRACTURE;  Surgeon: Newt Minion, MD;   Location: [place];  Service: Orthopedics;  Laterality: Left;  Open Reduction Internal Fixation Left Humerus   URETEROSCOPY     "laser for kidney stones"  Family History: Family History  Problem Relation Age of Onset   Diabetes Father    Arthritis Father    Hypertension Father    Diabetes Mother    Arthritis Mother    Hypertension Mother    Hypertension Brother     Social History: Social History   Socioeconomic History   Marital status: Single    Spouse name: Not on file   Number of children: Not on file   Years of education: Not on file   Highest education level: Not on file  Occupational History   Occupation: Disabled  Tobacco Use   Smoking status: Former    Packs/day: 1.00    Years: 28.00    Total pack years: 28.00    Types: Cigarettes    Quit date: 06/03/2014    Years since quitting: 7.9   Smokeless tobacco: Never  Vaping Use   Vaping Use: Never used  Substance and Sexual Activity   Alcohol use: Yes   Drug use: Yes    Types: Marijuana    Comment: occasionally   Sexual activity: Yes  Other Topics Concern   Not on file  Social History Narrative   Pt lives with his brother    Social Determinants of Health   Financial Resource Strain: Not on file  Food Insecurity: No Food Insecurity (12/11/2020)   Hunger Vital Sign    Worried About Running Out of Food in the Last Year: Never true    Ran Out of Food in the Last Year: Never true  Transportation Needs: No Transportation Needs (05/06/2022)   PRAPARE - Hydrologist (Medical): No    Lack of Transportation (Non-Medical): No  Physical Activity: Not on file  Stress: Not on file  Social Connections: Moderately Integrated (03/12/2021)   Social Connection and Isolation Panel [NHANES]    Frequency of Communication with Friends and Family: More than three times a week    Frequency of Social Gatherings with Friends and Family: More than three times a week    Attends Religious Services: More  than 4 times per year    Active Member of [employer] or Organizations: Yes    Attends Archivist Meetings: Never    Marital Status: Never married    Allergies:  Allergies  Allergen Reactions   Bee Venom Anaphylaxis    Objective:    Vital Signs:   Temp:  [98.1 F (36.7 C)-99.5 F (37.5 C)] 98.9 F (37.2 C) (11/28 1151) Pulse Rate:  [72-95] 80 (11/28 1100) Resp:  [15-33] 29 (11/28 1100) BP: (83-118)/(41-86) 111/59 (11/28 1100) SpO2:  [90 %-97 %] 95 % (11/28 1100) Last BM Date : 05/06/22  Weight change: Filed Weights   05/03/22 2007 05/04/22 0437 05/05/22 0500  Weight: 112.9 kg 116.8 kg 118.9 kg    Intake/Output:   Intake/Output Summary (Last 24 hours) at 05/06/2022 1155 Last data filed at 05/06/2022 1000 Gross per 24 hour  Intake 1154.54 ml  Output 1800 ml  Net -645.46 ml      Physical Exam    General:  fatigued appearing. No resp difficulty HEENT: normal Neck: supple. Thick neck, JVD not well visualized . Carotids 2+ bilat; no bruits. No lymphadenopathy or thyromegaly appreciated. Cor: PMI nondisplaced. Regular rate & rhythm. No rubs, gallops or murmurs. Lungs: clear Abdomen: soft, nontender, nondistended. No hepatosplenomegaly. No bruits or masses. Good bowel sounds. Extremities: no cyanosis, clubbing, rash, trace b/l LE edema Neuro: alert & orientedx3, cranial nerves grossly intact. moves all  4 extremities w/o difficulty. Affect pleasant   Telemetry   NSR 70s-80s (personally reviewed)   EKG    No new EKG to review  Labs   Basic Metabolic Panel: Recent Labs  Lab 05/03/22 2147 05/04/22 0412 05/04/22 1610 05/04/22 1855 05/04/22 2210 05/05/22 0635 05/05/22 1533  NA  --  137  --  131* 132* 132* 136  K  --  3.3* 3.1* 3.2* 3.1* 3.1* 4.5  CL  --  102  --  95* 97* 98 101  CO2  --  20*  --  21* 20* 21* 20*  GLUCOSE  --  267*  --  313* 229* 123* 131*  BUN  --  35*  --  40* 40* 43* 45*  CREATININE 2.61* 2.62*  --  3.04* 2.97* 3.06* 3.08*   CALCIUM  --  8.7*  --  8.2* 8.2* 8.0* 8.6*  MG 2.6* 2.4  --  2.4  --  2.6*  --   PHOS  --  2.1*  --   --  2.0* 2.6  --     Liver Function Tests: Recent Labs  Lab 05/03/22 2010 05/05/22 0635  AST 15 18  ALT 12 13  ALKPHOS 58 51  BILITOT 1.2 0.8  PROT 7.6 6.7  ALBUMIN 3.4* 2.6*   No results for input(s): "LIPASE", "AMYLASE" in the last 168 hours. No results for input(s): "AMMONIA" in the last 168 hours.  CBC: Recent Labs  Lab 05/03/22 2010 05/03/22 2025 05/03/22 2147 05/04/22 0412 05/05/22 0635  WBC 19.5*  --  16.1* 16.8* 18.0*  NEUTROABS 15.8*  --   --   --  15.3*  HGB 14.1 15.0 12.3* 11.6* 12.4*  HCT 41.9 44.0 37.3* 35.5* 35.9*  MCV 91.1  --  91.6 93.9 88.4  PLT 271  --  246 199 243    Cardiac Enzymes: No results for input(s): "CKTOTAL", "CKMB", "CKMBINDEX", "TROPONINI" in the last 168 hours.  BNP: BNP (last 3 results) Recent Labs    02/01/22 1702 05/03/22 2010  BNP 11.8 350.8*    ProBNP (last 3 results) Recent Labs    01/09/22 1002 03/21/22 1452  PROBNP 97 18.0     CBG: Recent Labs  Lab 05/05/22 1923 05/05/22 2333 05/06/22 0354 05/06/22 0748 05/06/22 1149  GLUCAP 201* 155* 109* 155* 141*    Coagulation Studies: No results for input(s): "LABPROT", "INR" in the last 72 hours.   Imaging   CT PELVIS WO CONTRAST  Result Date: 05/05/2022 CLINICAL DATA:  Soft tissue infection suspected, pelvis, xray done right gluteal edema, cellulitis, eval for infection EXAM: CT PELVIS WITHOUT CONTRAST TECHNIQUE: Multidetector CT imaging of the pelvis was performed following the standard protocol without intravenous contrast. RADIATION DOSE REDUCTION: This exam was performed according to the departmental dose-optimization program which includes automated exposure control, adjustment of the mA and/or kV according to patient size and/or use of iterative reconstruction technique. COMPARISON:  None Available. FINDINGS: Urinary Tract:  No abnormality visualized.  Bowel: Scattered colonic diverticulosis. Otherwise unremarkable visualized pelvic bowel loops. Vascular/Lymphatic: Atherosclerotic plaque. No pathologically enlarged lymph nodes. No significant vascular abnormality seen. Reproductive: Prostate measures 4.8 cm. No mass or other significant abnormality Other: No intraperitoneal free fluid. No intraperitoneal free gas. No organized fluid collection. Musculoskeletal: Diffuse subcutaneus soft tissue edema of the right gluteal soft tissues with no emphysema or organized fluid collection. Associated slight haziness of the right gluteal maximus. Slight extension of the fat stranding to the perineal region and possible right scrotum. Some  mild stranding of the left gluteal soft tissues may be normal. No cortical erosion or destruction. No suspicious bone lesions identified. No acute displaced fracture or dislocation. At least mild degenerative changes of the bilateral hips. IMPRESSION: 1. Diffuse subcutaneus soft tissue edema of the right gluteal soft tissues with no emphysema or organized fluid collection. Associated slight haziness of the right gluteal maximus suggestive of possible developing associated myositis. Slight extension of the fat stranding to the perineal region and possible right scrotum. Please note a necrotizing fasciitis cannot be excluded as this is a clinical diagnosis. 2. Other imaging findings of potential clinical significance: Colonic diverticulosis with no acute diverticulitis. Electronically Signed   By: Iven Finn M.D.   On: 05/05/2022 20:37     Medications:     Current Medications:  aspirin EC  81 mg Oral Daily   atorvastatin  10 mg Oral Daily   Chlorhexidine Gluconate Cloth  6 each Topical Daily   heparin  5,000 Units Subcutaneous Q8H   insulin aspart  0-15 Units Subcutaneous Q4H   insulin detemir  10 Units Subcutaneous BID   tamsulosin  0.4 mg Oral Daily    Infusions:  amiodarone Stopped (05/06/22 1133)    piperacillin-tazobactam (ZOSYN)  IV 12.5 mL/hr at 05/06/22 1000      Patient Profile   56 y/o AAM w/ h/o chronic systolic heart failure due to NICM, s/p ICD, admitted w/ VT storm treated w/ multiple ICD shocks w/ witnessed VT arrest while in ED treated w/ CPR and defib, all in setting of metabolic derangement/hypokalemia from GI loses. AHF team consulted for assistance w/ med management in setting of worsening renal function and soft BP, concerning for potential low output HF.   Assessment/Plan   1. VT Storm - had 22 ICD shocks and 2 ATP, 10 aborted shocks - in setting of hypokalemia from GI loses, initial K 2.8 (suspected etiology) - loaded w/ IV amiodarone, K corrected - VT has been quiescent since 11/25 w/ repletion of K. EP plans to stop amiodarone today and monitor  - Update 2D echo - doubt ischemic component. Normal cath in 2015. No CP. + mild HS trop elevation but flat trend, most c/w demand ischemia   2. Chronic Systolic Heart Failure  - NICM - Echo 07/2013 EF 20-25%. RV normal>> LHC normal cors. - Repeat Echo 5/15 EF 30-35% - s/p ICD 7/15 - Echo 8/22, EF 30%, RV normal - Admitted w/ VT storm. Suspect substrate was hypokalemia but need to r/o progressive HF given worsening renal fx  - Update 2D echo. Check lactic acid level. Avoiding central lines w/ current infection  - volume assessment difficult given body habitus, will assess IVC on echo  - GDMT currently limited by renal fx and soft BP  - no SGLT2 w/ perineal infection   3. AKI on Stage IIIb CKD - b/l SCr ~2.0 - elevated this admit, ~3.0 - suspect due to dehydration, but ? Low output - hold diuretics for now/ avoid hypotension   - follow BMP closely. If continued rise in SCr may need RHC    4. Right gluteal cellulitis and wound - CT scan with no evidence for abscess or drainable fluid collection - GS following - c/w abx, on Zosyn   5. Type DM - insulin per primary team    Length of Stay: [address]  Ladoris Gene  05/06/2022, 11:55 AM  Advanced Heart Failure Team Pager [phone] (M-F; 7a - 5p)  Please contact [facility] for  night-coverage after hours (4p -7a ) and weekends on [email or web address]

## 2022-05-06 NOTE — Progress Notes (Signed)
Subjective: Patient still with some diarrhea.  No further cardiac arrhythmias currently.  Still with buttock pain.  Said he had discharge overnight.   Objective: Vital signs in last 24 hours: Temp:  [98.1 F (36.7 C)-99.5 F (37.5 C)] 98.7 F (37.1 C) (11/28 0751) Pulse Rate:  [72-95] 72 (11/28 0700) Resp:  [15-34] 26 (11/28 0700) BP: (83-118)/(41-86) 103/65 (11/28 0700) SpO2:  [90 %-97 %] 93 % (11/28 0700) Last BM Date : 05/05/22  Intake/Output from previous day: 11/27 0701 - 11/28 0700 In: 1105.5 [P.O.:120; I.V.:381; IV Piggyback:604.5] Out: 1400 [Urine:1400] Intake/Output this shift: No intake/output data recorded.  PE: Skin: progression of induration, erythema, and warmth to this area on his right gluteus.  More indurated in his perineum tracking towards his scrotum.  No drainage from small nickel size area of necrotic skin.  No fluctuance or drainable collections noted on exam  Lab Results:  Recent Labs    05/04/22 0412 05/05/22 0635  WBC 16.8* 18.0*  HGB 11.6* 12.4*  HCT 35.5* 35.9*  PLT 199 243   BMET Recent Labs    05/05/22 0635 05/05/22 1533  NA 132* 136  K 3.1* 4.5  CL 98 101  CO2 21* 20*  GLUCOSE 123* 131*  BUN 43* 45*  CREATININE 3.06* 3.08*  CALCIUM 8.0* 8.6*   PT/INR No results for input(s): "LABPROT", "INR" in the last 72 hours. CMP     Component Value Date/Time   NA 136 05/05/2022 1533   NA 135 01/09/2022 1002   K 4.5 05/05/2022 1533   CL 101 05/05/2022 1533   CO2 20 (L) 05/05/2022 1533   GLUCOSE 131 (H) 05/05/2022 1533   BUN 45 (H) 05/05/2022 1533   BUN 19 01/09/2022 1002   CREATININE 3.08 (H) 05/05/2022 1533   CREATININE 1.68 (H) 04/24/2016 1502   CALCIUM 8.6 (L) 05/05/2022 1533   PROT 6.7 05/05/2022 0635   PROT 8.0 08/26/2021 1608   ALBUMIN 2.6 (L) 05/05/2022 0635   ALBUMIN 4.5 08/26/2021 1608   AST 18 05/05/2022 0635   ALT 13 05/05/2022 0635   ALKPHOS 51 05/05/2022 0635   BILITOT 0.8 05/05/2022 0635   BILITOT 0.6  08/26/2021 1608   GFRNONAA 23 (L) 05/05/2022 1533   GFRNONAA >89 12/18/2014 1455   GFRAA 53 (L) 07/17/2020 1705   GFRAA >89 12/18/2014 1455   Lipase     Component Value Date/Time   LIPASE 40 06/20/2013 1156       Studies/Results: CT PELVIS WO CONTRAST  Result Date: 05/05/2022 CLINICAL DATA:  Soft tissue infection suspected, pelvis, xray done right gluteal edema, cellulitis, eval for infection EXAM: CT PELVIS WITHOUT CONTRAST TECHNIQUE: Multidetector CT imaging of the pelvis was performed following the standard protocol without intravenous contrast. RADIATION DOSE REDUCTION: This exam was performed according to the departmental dose-optimization program which includes automated exposure control, adjustment of the mA and/or kV according to patient size and/or use of iterative reconstruction technique. COMPARISON:  None Available. FINDINGS: Urinary Tract:  No abnormality visualized. Bowel: Scattered colonic diverticulosis. Otherwise unremarkable visualized pelvic bowel loops. Vascular/Lymphatic: Atherosclerotic plaque. No pathologically enlarged lymph nodes. No significant vascular abnormality seen. Reproductive: Prostate measures 4.8 cm. No mass or other significant abnormality Other: No intraperitoneal free fluid. No intraperitoneal free gas. No organized fluid collection. Musculoskeletal: Diffuse subcutaneus soft tissue edema of the right gluteal soft tissues with no emphysema or organized fluid collection. Associated slight haziness of the right gluteal maximus. Slight extension of the fat stranding to  the perineal region and possible right scrotum. Some mild stranding of the left gluteal soft tissues may be normal. No cortical erosion or destruction. No suspicious bone lesions identified. No acute displaced fracture or dislocation. At least mild degenerative changes of the bilateral hips. IMPRESSION: 1. Diffuse subcutaneus soft tissue edema of the right gluteal soft tissues with no emphysema or  organized fluid collection. Associated slight haziness of the right gluteal maximus suggestive of possible developing associated myositis. Slight extension of the fat stranding to the perineal region and possible right scrotum. Please note a necrotizing fasciitis cannot be excluded as this is a clinical diagnosis. 2. Other imaging findings of potential clinical significance: Colonic diverticulosis with no acute diverticulitis. Electronically Signed   By: Iven Finn M.D.   On: 05/05/2022 20:37    Anti-infectives: Anti-infectives (From admission, onward)    Start     Dose/Rate Route Frequency Ordered Stop   05/05/22 1400  piperacillin-tazobactam (ZOSYN) IVPB 3.375 g        3.375 g 12.5 mL/hr over 240 Minutes Intravenous Every 8 hours 05/05/22 1158     05/05/22 1030  amoxicillin-clavulanate (AUGMENTIN) 875-125 MG per tablet 1 tablet  Status:  Discontinued        1 tablet Oral Every 12 hours 05/05/22 0936 05/05/22 1158        Assessment/Plan Right gluteal cellulitis and wound -ct scan with no evidence for abscess or drainable fluid collection.  He has evidence of soft tissue edema and stranding c/w cellulitis -cont zosyn -labs pending today -physical exam with more induration and warmth today.  Will continue to monitor this closely as it can progress to needing operative intervention, but nothing to drain currently.   -discussed with patient as well as primary service on ward.     FEN - HH VTE - heparin, ASA ID - zosyn   Vtach - AICD in place CHF HTN DM Obesity  I reviewed Consultant cardiology notes, hospitalist notes, last 24 h vitals and pain scores, last 48 h intake and output, last 24 h labs and trends, and last 24 h imaging results.   LOS: 3 days    Henreitta Cea , [facility] 05/06/2022, 8:14 AM Please see Amion for pager number during day hours 7:00am-4:30pm or 7:00am -11:30am on weekends

## 2022-05-06 NOTE — Progress Notes (Signed)
  Echocardiogram 2D Echocardiogram has been performed.  [name] 05/06/2022, 3:29 PM

## 2022-05-06 NOTE — Progress Notes (Signed)
Rounding Note    Patient Name: [name] Date of Encounter: 05/06/2022  [place] Cardiologist: [name], MD   Subjective   Chronically short of breath but no change from baseline.  Patient has chest pain probably from CPR and continues to have diarrhea.    Inpatient Medications    Scheduled Meds:  aspirin EC  81 mg Oral Daily   atorvastatin  10 mg Oral Daily   Chlorhexidine Gluconate Cloth  6 each Topical Daily   heparin  5,000 Units Subcutaneous Q8H   insulin aspart  0-15 Units Subcutaneous Q4H   insulin detemir  10 Units Subcutaneous BID   tamsulosin  0.4 mg Oral Daily   Continuous Infusions:  amiodarone 30 mg/hr (05/06/22 0603)   piperacillin-tazobactam (ZOSYN)  IV 3.375 g (05/06/22 0601)   PRN Meds: Gerhardt's butt cream, HYDROmorphone, levalbuterol, mouth rinse, oxyCODONE, polyethylene glycol   Vital Signs    Vitals:   05/06/22 0400 05/06/22 0600 05/06/22 0700 05/06/22 0751  BP: 106/64 113/75 103/65   Pulse: 85 84 72   Resp: (!) 23 (!) 31 (!) 26   Temp:    98.7 F (37.1 C)  TempSrc:    Oral  SpO2: 95% 93% 93%   Weight:      Height:        Intake/Output Summary (Last 24 hours) at 05/06/2022 0813 Last data filed at 05/06/2022 0600 Gross per 24 hour  Intake 1088.91 ml  Output 1200 ml  Net -111.09 ml      05/05/2022    5:00 AM 05/04/2022    4:37 AM 05/03/2022    8:07 PM  Last 3 Weights  Weight (lbs) 262 lb 1.6 oz 257 lb 8 oz 249 lb  Weight (kg) 118.888 kg 116.8 kg 112.946 kg      Telemetry    Sinus rhythm.  No evidence of VT/VF- Personally Reviewed  ECG    Not performed today- Personally Reviewed  Physical Exam   GEN: No acute distress.   Neck: No JVD Cardiac: RRR, no murmurs, rubs, or gallops.  Respiratory: Clear to auscultation bilaterally. GI: Soft, nontender, non-distended  MS: No edema; No deformity. Neuro:  Nonfocal  Psych: Normal affect   Labs    High Sensitivity Troponin:   Recent Labs  Lab  05/03/22 2010 05/03/22 2147 05/03/22 2317  TROPONINIHS 172* 399* 553*     Chemistry Recent Labs  Lab 05/03/22 2010 05/03/22 2025 05/04/22 0412 05/04/22 1610 05/04/22 1855 05/04/22 2210 05/05/22 0635 05/05/22 1533  NA 138   < > 137  --  131* 132* 132* 136  K 2.8*   < > 3.3*   < > 3.2* 3.1* 3.1* 4.5  CL 99   < > 102  --  95* 97* 98 101  CO2 21*  --  20*  --  21* 20* 21* 20*  GLUCOSE 244*   < > 267*  --  313* 229* 123* 131*  BUN 32*   < > 35*  --  40* 40* 43* 45*  CREATININE 2.84*   < > 2.62*  --  3.04* 2.97* 3.06* 3.08*  CALCIUM 9.0  --  8.7*  --  8.2* 8.2* 8.0* 8.6*  MG  --    < > 2.4  --  2.4  --  2.6*  --   PROT 7.6  --   --   --   --   --  6.7  --   ALBUMIN 3.4*  --   --   --   --   --  2.6*  --   AST 15  --   --   --   --   --  18  --   ALT 12  --   --   --   --   --  13  --   ALKPHOS 58  --   --   --   --   --  51  --   BILITOT 1.2  --   --   --   --   --  0.8  --   GFRNONAA 25*   < > 28*  --  23* 24* 23* 23*  ANIONGAP 18*  --  15  --  15 15 13 15    < > = values in this interval not displayed.    Lipids No results for input(s): "CHOL", "TRIG", "HDL", "LABVLDL", "LDLCALC", "CHOLHDL" in the last 168 hours.  Hematology Recent Labs  Lab 05/03/22 2147 05/04/22 0412 05/05/22 0635  WBC 16.1* 16.8* 18.0*  RBC 4.07* 3.78* 4.06*  HGB 12.3* 11.6* 12.4*  HCT 37.3* 35.5* 35.9*  MCV 91.6 93.9 88.4  MCH 30.2 30.7 30.5  MCHC 33.0 32.7 34.5  RDW 14.5 14.6 14.6  PLT 246 199 243   Thyroid No results for input(s): "TSH", "FREET4" in the last 168 hours.  BNP Recent Labs  Lab 05/03/22 2010  BNP 350.8*    DDimer No results for input(s): "DDIMER" in the last 168 hours.   Radiology    CT PELVIS WO CONTRAST  Result Date: 05/05/2022 CLINICAL DATA:  Soft tissue infection suspected, pelvis, xray done right gluteal edema, cellulitis, eval for infection EXAM: CT PELVIS WITHOUT CONTRAST TECHNIQUE: Multidetector CT imaging of the pelvis was performed following the standard  protocol without intravenous contrast. RADIATION DOSE REDUCTION: This exam was performed according to the departmental dose-optimization program which includes automated exposure control, adjustment of the mA and/or kV according to patient size and/or use of iterative reconstruction technique. COMPARISON:  None Available. FINDINGS: Urinary Tract:  No abnormality visualized. Bowel: Scattered colonic diverticulosis. Otherwise unremarkable visualized pelvic bowel loops. Vascular/Lymphatic: Atherosclerotic plaque. No pathologically enlarged lymph nodes. No significant vascular abnormality seen. Reproductive: Prostate measures 4.8 cm. No mass or other significant abnormality Other: No intraperitoneal free fluid. No intraperitoneal free gas. No organized fluid collection. Musculoskeletal: Diffuse subcutaneus soft tissue edema of the right gluteal soft tissues with no emphysema or organized fluid collection. Associated slight haziness of the right gluteal maximus. Slight extension of the fat stranding to the perineal region and possible right scrotum. Some mild stranding of the left gluteal soft tissues may be normal. No cortical erosion or destruction. No suspicious bone lesions identified. No acute displaced fracture or dislocation. At least mild degenerative changes of the bilateral hips. IMPRESSION: 1. Diffuse subcutaneus soft tissue edema of the right gluteal soft tissues with no emphysema or organized fluid collection. Associated slight haziness of the right gluteal maximus suggestive of possible developing associated myositis. Slight extension of the fat stranding to the perineal region and possible right scrotum. Please note a necrotizing fasciitis cannot be excluded as this is a clinical diagnosis. 2. Other imaging findings of potential clinical significance: Colonic diverticulosis with no acute diverticulitis. Electronically Signed   By: Iven Finn M.D.   On: 05/05/2022 20:37    Cardiac Studies   2D  echocardiogram (05/04/2022)  IMPRESSIONS     1. Left ventricular ejection fraction, by estimation, is 30%. The left  ventricle has moderately decreased function. The left ventricle  demonstrates global  hypokinesis. The left ventricular internal cavity size  was severely dilated. There is mild left  ventricular hypertrophy. Left ventricular diastolic parameters are  consistent with Grade I diastolic dysfunction (impaired relaxation).   2. Right ventricular systolic function is normal. The right ventricular  size is normal.   3. The mitral valve is normal in structure. Mild mitral valve  regurgitation.   4. The aortic valve is tricuspid. Aortic valve regurgitation is not  visualized.   5. Aortic dilatation noted. There is mild dilatation of the aortic root,  measuring 41 mm.   Comparison(s): The left ventricular function is worsened.   Patient Profile     [name] is a 56 y.o. male with a hx of NICM/chronic systolic CHF s/p ICD, normal coronary arteries by cath 2015, GERD, HTN and DM2 who is being seen today for the evaluation of ventricular tachycardia/Vfib with multiple ICD shocks at the request of Dr. Matilde Sprang in [place].   Assessment & Plan    1: VT arrest-patient has nonischemic cardiomyopathy status post remote ICD implantation.  He has severe LV dysfunction with an EF in the 30% range.  Apparently his Trulicity was doubled which led to nausea vomiting diarrhea and subsequent hypokalemia and hypomagnesemia.  This was the substrate for ventricular tachyarrhythmias including VT/VF and multiple ICD discharges as well as brief CPR.  He has had no further arrhythmias on telemetry.  His medications have been held because of "soft blood pressure".  His K is being repleted although not as quickly as I would like.  His mag is normal at 2.6.  He still having diarrhea.  Potassium has improved up to 4.5.  He has had no further arrhythmias.  He remains on amnio drip.  EP to see today.  2:  Nonischemic cardiomyopathy-had cardiac catheterization to thousand 15 that showed normal coronary arteries.  He does have a ICD implanted.  He is followed by [name] and has been stable as an outpatient.  His medications have been held here in the hospital because of soft blood pressure.  We will consider restarting his home meds once he is ambulatory and is vital signs are more stable.  Given his baseline creatinine of 2 (now 3.0), not a candidate for ACE/ARB or Entresto.  His I's and O's are +2 L.  He may be "dry" given his ongoing diarrhea.  I am hesitant to begin his heart failure meds back given this.  We will get the advanced heart failure service to weigh in regarding if and when to restart his guideline directed ox medical therapy for nonischemic cardiomyopathy.  3: Hypertension-off IV pressors.  Beta-blockers and diuretics on hold.  4: AKI-baseline creatinine is in the 2 range.  Currently at 3.  He is 2.5 L positive.  His diuretics are on hold.  I suspect this is from being relatively dry as well as potential component of ATN from hypotension and CPR  5: Diabetes-on sliding scale insulin.  Trulicity on hold.  Followed by PCCM.  Patient has had no further shocks.  His serum K is increased to 4.5 with repletion.  Labs are pending this morning.  He continues to have diarrhea.  There was a question of C. difficile but panel was negative.  He is getting antibiotics for his buttocks cellulitis.  We will get electrophysiology service to weigh in regarding continuation of amnio p.o.  Further recommendations per primary service (PCCM).     For questions or updates, please contact [place] Please  consult [email or web address] for contact info under        Signed, Quay Burow, MD  05/06/2022, 8:13 AM

## 2022-05-06 NOTE — Progress Notes (Signed)
NAME:  [name], MRN:  [id number], DOB:  Jan 31, 1966, LOS: 3 ADMISSION DATE:  05/03/2022, CONSULTATION DATE:  11/25 REFERRING MD:  [place], CHIEF COMPLAINT:  Diarrhea   History of Present Illness:  [name] is an 56 y.o. M who presented to the [facility] ED via EMS with a chief complaint of diarrhea and ICD shocks.  Patient dose of trulicity increased on 40/98. Reports since increase having diarrhea daily. Denies nausea, vomiting, BRBPR, melena. Patient reportedly with ICD shock on AM on 11/25. Unable to take in PO on 11/25. EMS called.  In the [facility] ED he had a witnessed VTACH arrest with CPR and ROSC achieved with one shock (30 seconds). Return to baseline neurological status. K 2.8, Creat 2.84 (baseline 1.8-2.3), WBC 19.5. Tmax 99.1. Troponin 172. He was given diludid for chest pain post CPR. He developed hypotension (74/40). Cardiology consulted.   PCCM consulted for admission.  Pertinent  Medical History  NICM, Systolic CHF s/p ICD placement, DM2, CKD stage 3, obesity, BPH, GERD, chronic LBP secondary to spondylosis and spinal stenosis.  Significant Hospital Events: Including procedures, antibiotic start and stop dates in addition to other pertinent events   11/25 Presented to [facility], [facility] 11/27 CT pelvis > subcutaneous soft tissue edema of right gluteal soft tissues with no emphysema or organized fluid collection, slight extension of fat stranding towards perineal region and possible right scrotum.  Interim History / Subjective:  Continues to have chest discomfort after CPR. No further arrhythmias. Continues to have buttock pain. CT without gas/air. C.diff negative.  Objective   Blood pressure 103/65, pulse 72, temperature 98.7 F (37.1 C), temperature source Oral, resp. rate (!) 26, height 6' (1.829 m), weight 118.9 kg, SpO2 93 %.        Intake/Output Summary (Last 24 hours) at 05/06/2022 0836 Last data filed at 05/06/2022 0600 Gross per 24 hour  Intake 1088.91 ml  Output  1200 ml  Net -111.09 ml    Filed Weights   05/03/22 2007 05/04/22 0437 05/05/22 0500  Weight: 112.9 kg 116.8 kg 118.9 kg    Examination: General: Adult male, up on commode, in NAD. Neuro: A&O x 3, no deficits. HEENT: [place]/AT. Sclerae anicteric. EOMI. Cardiovascular: RRR, no M/R/G.  Lungs: Respirations even and unlabored.  CTA bilaterally, No W/R/R. Abdomen: BS x 4, soft, NT/ND.  Musculoskeletal: No gross deformities, no edema.  Skin: Right gluteal cleft area of induration and tenderness extending laterally, warm to touch, presumed cellulitis vs abscess. Otherwise intact, warm, no rashes.  Assessment & Plan:   VT cardiac arrest likely in the setting of hypokalemia.  Nonischemic cardiomyopathy with chronic HFrEF s/p AICD. Demand cardiac ischemia in the setting of frequent AICD firing. - Cardiology is following. - EP and heart failure team to see. - Continue amiodarone infusion.  Hypovolemic shock - resolved. Diarrhea, likely medication induced - Patient dose of trulicity increased on 11/91. Reports since increase having diarrhea daily up to 20 episodes per day. Diarrhea frequency improved since Trulicity has been stopped. C.diff negative. - Encourage oral fluid intake. - Continue Loperamide.  AKI on CKD stage IIIb due to dehydration. Hypokalemia/Hypomagnesemia - resolved after supplementation. - Continue aggressive electrolyte supplement and monitor.  Poorly controlled diabetes type 2 with hyperglycemia, complicated with diabetic neuropathy. - Continue SSI, Levemir. - Hold Trulicity.  Obesity. - Dietitian consulted.  Right gluteal abscess vs cellulitis - no abscess or drainable collection. CT without gas formation. - Surgery following, hoping to avoid OR but monitoring closely. -  Continue Zosyn.   Best Practice (right click and "Reselect all SmartList Selections" daily)   Diet/type: Heart healthy DVT prophylaxis: prophylactic heparin  GI prophylaxis: PPI Lines:  N/A Foley:  N/A Code Status:  full code Last date of multidisciplinary goals of care discussion [Full scope]  CC time: 35 min.   Montey Hora, [facility] For pager details, please see AMION or use Epic chat  After 1900, please call [facility] for cross coverage needs 05/06/2022, 8:36 AM

## 2022-05-07 ENCOUNTER — Inpatient Hospital Stay (HOSPITAL_COMMUNITY): Payer: Medicaid Other

## 2022-05-07 ENCOUNTER — Ambulatory Visit: Payer: Medicaid Other | Admitting: Pulmonary Disease

## 2022-05-07 DIAGNOSIS — E875 Hyperkalemia: Secondary | ICD-10-CM | POA: Diagnosis not present

## 2022-05-07 DIAGNOSIS — N179 Acute kidney failure, unspecified: Secondary | ICD-10-CM | POA: Diagnosis not present

## 2022-05-07 DIAGNOSIS — E871 Hypo-osmolality and hyponatremia: Secondary | ICD-10-CM | POA: Diagnosis not present

## 2022-05-07 DIAGNOSIS — M10361 Gout due to renal impairment, right knee: Secondary | ICD-10-CM | POA: Diagnosis not present

## 2022-05-07 DIAGNOSIS — I472 Ventricular tachycardia, unspecified: Secondary | ICD-10-CM | POA: Diagnosis not present

## 2022-05-07 DIAGNOSIS — M25561 Pain in right knee: Secondary | ICD-10-CM | POA: Diagnosis not present

## 2022-05-07 DIAGNOSIS — M10372 Gout due to renal impairment, left ankle and foot: Secondary | ICD-10-CM

## 2022-05-07 DIAGNOSIS — I5022 Chronic systolic (congestive) heart failure: Secondary | ICD-10-CM | POA: Diagnosis not present

## 2022-05-07 DIAGNOSIS — N1831 Chronic kidney disease, stage 3a: Secondary | ICD-10-CM | POA: Diagnosis not present

## 2022-05-07 DIAGNOSIS — R079 Chest pain, unspecified: Secondary | ICD-10-CM | POA: Diagnosis not present

## 2022-05-07 LAB — BASIC METABOLIC PANEL
Anion gap: 10 (ref 5–15)
BUN: 37 mg/dL — ABNORMAL HIGH (ref 6–20)
CO2: 19 mmol/L — ABNORMAL LOW (ref 22–32)
Calcium: 7.9 mg/dL — ABNORMAL LOW (ref 8.9–10.3)
Chloride: 103 mmol/L (ref 98–111)
Creatinine, Ser: 2.74 mg/dL — ABNORMAL HIGH (ref 0.61–1.24)
GFR, Estimated: 26 mL/min — ABNORMAL LOW (ref >60)
Glucose, Bld: 142 mg/dL — ABNORMAL HIGH (ref 70–99)
Potassium: 3.8 mmol/L (ref 3.5–5.1)
Sodium: 132 mmol/L — ABNORMAL LOW (ref 135–145)

## 2022-05-07 LAB — CBC
HCT: 33.8 % — ABNORMAL LOW (ref 39.0–52.0)
Hemoglobin: 11.3 g/dL — ABNORMAL LOW (ref 13.0–17.0)
MCH: 30.2 pg (ref 26.0–34.0)
MCHC: 33.4 g/dL (ref 30.0–36.0)
MCV: 90.4 fL (ref 80.0–100.0)
Platelets: 256 10*3/uL (ref 150–400)
RBC: 3.74 MIL/uL — ABNORMAL LOW (ref 4.22–5.81)
RDW: 15.3 % (ref 11.5–15.5)
WBC: 13.9 10*3/uL — ABNORMAL HIGH (ref 4.0–10.5)
nRBC: 0 % (ref 0.0–0.2)

## 2022-05-07 LAB — GASTROINTESTINAL PANEL BY PCR, STOOL (REPLACES STOOL CULTURE)

## 2022-05-07 LAB — GLUCOSE, CAPILLARY
Glucose-Capillary: 125 mg/dL — ABNORMAL HIGH (ref 70–99)
Glucose-Capillary: 146 mg/dL — ABNORMAL HIGH (ref 70–99)
Glucose-Capillary: 148 mg/dL — ABNORMAL HIGH (ref 70–99)
Glucose-Capillary: 150 mg/dL — ABNORMAL HIGH (ref 70–99)
Glucose-Capillary: 153 mg/dL — ABNORMAL HIGH (ref 70–99)
Glucose-Capillary: 196 mg/dL — ABNORMAL HIGH (ref 70–99)
Glucose-Capillary: 229 mg/dL — ABNORMAL HIGH (ref 70–99)

## 2022-05-07 LAB — URIC ACID: Uric Acid, Serum: 10 mg/dL — ABNORMAL HIGH (ref 3.7–8.6)

## 2022-05-07 LAB — MAGNESIUM: Magnesium: 2.5 mg/dL — ABNORMAL HIGH (ref 1.7–2.4)

## 2022-05-07 MED ORDER — POTASSIUM CHLORIDE CRYS ER 20 MEQ PO TBCR
20.0000 meq | EXTENDED_RELEASE_TABLET | Freq: Once | ORAL | Status: AC
  Administered 2022-05-07: 20 meq via ORAL
  Filled 2022-05-07: qty 1

## 2022-05-07 MED ORDER — SERTRALINE HCL 25 MG PO TABS
12.5000 mg | ORAL_TABLET | Freq: Every day | ORAL | Status: DC
  Filled 2022-05-07 (×4): qty 1

## 2022-05-07 MED ORDER — COLCHICINE 0.3 MG HALF TABLET
0.3000 mg | ORAL_TABLET | Freq: Two times a day (BID) | ORAL | Status: DC
  Administered 2022-05-07 – 2022-05-17 (×20): 0.3 mg via ORAL
  Filled 2022-05-07 (×21): qty 1

## 2022-05-07 MED ORDER — BANATROL TF EN LIQD
60.0000 mL | Freq: Two times a day (BID) | ENTERAL | Status: DC
  Administered 2022-05-07 – 2022-05-13 (×6): 60 mL via ORAL
  Filled 2022-05-07 (×14): qty 60

## 2022-05-07 NOTE — Progress Notes (Signed)
No further VT off amio.   Echo shows EF 30-35%, grade 1 DD  Potassium3.8 (11/29 0535) Magnesium  2.5* (11/29 0535) Creatinine, ser  2.74* (11/29 0535) Keep K > 4.0 and Mg > 2.0   EP will follow at a distance.   [name] [address]" [place], PA-C  05/07/2022 7:54 AM

## 2022-05-07 NOTE — Consult Note (Signed)
Advanced Heart Failure Rounding Note  PCP-Cardiologist: [name], MD   Subjective:    No further VT.  Labs not c/w low output. Lactic acid and LFTs ok. SCr starting to improve 3.08>>2.86   Echo 11/28 EF 30-35%, RV normal. The inferior vena cava is normal in size with greater than 50% respiratory variability, suggesting right atrial pressure of 3 mmHg. BNP 487.   C/w diarrhea. GI panel pending. C-diff negative.   Remains on Zosyn for buttock wound. WBC downtrending.  Complaining of new polyarticular pain, left ankle and rt knee.    Objective:   Weight Range: 119.7 kg Body mass index is 35.79 kg/m.   Vital Signs:   Temp:  [98 F (36.7 C)-102.1 F (38.9 C)] 98.2 F (36.8 C) (11/29 0000) Pulse Rate:  [73-88] 84 (11/29 0900) Resp:  [15-67] 29 (11/29 0900) BP: (96-130)/(44-87) 102/53 (11/29 0900) SpO2:  [91 %-97 %] 94 % (11/29 0900) Weight:  [119.7 kg] 119.7 kg (11/29 0500) Last BM Date : 05/07/22  Weight change: Filed Weights   05/04/22 0437 05/05/22 0500 05/07/22 0500  Weight: 116.8 kg 118.9 kg 119.7 kg    Intake/Output:   Intake/Output Summary (Last 24 hours) at 05/07/2022 1148 Last data filed at 05/07/2022 1000 Gross per 24 hour  Intake 1418.07 ml  Output 1100 ml  Net 318.07 ml      Physical Exam    General:  fatigue appearing. No resp difficulty HEENT: Normal Neck: Supple. Thick neck JVD not well visualized . Carotids 2+ bilat; no bruits. No lymphadenopathy or thyromegaly appreciated. Cor: PMI nondisplaced. Regular rate & rhythm. No rubs, gallops or murmurs. Lungs: Clear Abdomen: Soft, nontender, nondistended. No hepatosplenomegaly. No bruits or masses. Good bowel sounds. Extremities: No cyanosis, clubbing, rash, edema Neuro: Alert & orientedx3, cranial nerves grossly intact. moves all 4 extremities w/o difficulty. Affect pleasant   Telemetry   NSR 70s, no further VT   EKG    No new EKG to review   Labs    CBC Recent Labs     05/05/22 0635 05/06/22 1810 05/07/22 0535  WBC 18.0* 14.6* 13.9*  NEUTROABS 15.3*  --   --   HGB 12.4* 12.5* 11.3*  HCT 35.9* 36.3* 33.8*  MCV 88.4 89.0 90.4  PLT 243 247 607   Basic Metabolic Panel Recent Labs    05/04/22 2210 05/05/22 0635 05/05/22 1533 05/06/22 1645 05/07/22 0535  NA 132* 132*   < > 135 132*  K 3.1* 3.1*   < > 3.9 3.8  CL 97* 98   < > 101 103  CO2 20* 21*   < > 21* 19*  GLUCOSE 229* 123*   < > 163* 142*  BUN 40* 43*   < > 39* 37*  CREATININE 2.97* 3.06*   < > 2.86* 2.74*  CALCIUM 8.2* 8.0*   < > 8.5* 7.9*  MG  --  2.6*  --  2.6* 2.5*  PHOS 2.0* 2.6  --   --   --    < > = values in this interval not displayed.   Liver Function Tests Recent Labs    05/05/22 0635 05/06/22 1645  AST 18 40  ALT 13 30  ALKPHOS 51 62  BILITOT 0.8 1.0  PROT 6.7 6.7  ALBUMIN 2.6* 2.5*   No results for input(s): "LIPASE", "AMYLASE" in the last 72 hours. Cardiac Enzymes No results for input(s): "CKTOTAL", "CKMB", "CKMBINDEX", "TROPONINI" in the last 72 hours.  BNP: BNP (last 3 results)  Recent Labs    02/01/22 1702 05/03/22 2010 05/06/22 1736  BNP 11.8 350.8* 487.7*    ProBNP (last 3 results) Recent Labs    01/09/22 1002 03/21/22 1452  PROBNP 97 18.0     D-Dimer No results for input(s): "DDIMER" in the last 72 hours. Hemoglobin A1C No results for input(s): "HGBA1C" in the last 72 hours. Fasting Lipid Panel No results for input(s): "CHOL", "HDL", "LDLCALC", "TRIG", "CHOLHDL", "LDLDIRECT" in the last 72 hours. Thyroid Function Tests Recent Labs    05/06/22 1736  TSH 1.156    Other results:   Imaging    ECHOCARDIOGRAM COMPLETE  Result Date: 05/06/2022    ECHOCARDIOGRAM REPORT   Patient Name:   [name] Date of Exam: 05/06/2022 Medical Rec #:  [id number]      Height:       72.0 in Accession #:    [id number]     Weight:       262.1 lb Date of Birth:  1966-06-05      BSA:          2.390 m Patient Age:    56 years       BP:           102/75  mmHg Patient Gender: M              HR:           89 bpm. Exam Location:  Inpatient Procedure: 2D Echo, Color Doppler and Cardiac Doppler Indications:    ventricular tachycardia  History:        Patient has prior history of Echocardiogram examinations, most                 recent 02/01/2021. Cardiomyopathy, Defibrillator, chronic kidney                 disease; Risk Factors:Former Smoker and Diabetes.  Sonographer:    Johny Chess RDCS Referring Phys: [id number] Shirley Friar  Sonographer Comments: Patient is obese. Image acquisition challenging due to patient body habitus and Image acquisition challenging due to respiratory motion. IMPRESSIONS  1. Left ventricular ejection fraction, by estimation, is 30 to 35%. The left ventricle has moderately decreased function. The left ventricle demonstrates global hypokinesis. The left ventricular internal cavity size was moderately dilated. There is mild  eccentric left ventricular hypertrophy. Left ventricular diastolic parameters are consistent with Grade I diastolic dysfunction (impaired relaxation).  2. Right ventricular systolic function is normal. The right ventricular size is normal. Tricuspid regurgitation signal is inadequate for assessing PA pressure.  3. Left atrial size was moderately dilated.  4. The mitral valve is normal in structure. No evidence of mitral valve regurgitation.  5. The aortic valve is tricuspid. Aortic valve regurgitation is not visualized. No aortic stenosis is present.  6. Aortic dilatation noted. There is borderline dilatation of the aortic root, measuring 38 mm.  7. The inferior vena cava is normal in size with greater than 50% respiratory variability, suggesting right atrial pressure of 3 mmHg. Comparison(s): No significant change from prior study. Prior images reviewed side by side. FINDINGS  Left Ventricle: Left ventricular ejection fraction, by estimation, is 30 to 35%. The left ventricle has moderately decreased function. The  left ventricle demonstrates global hypokinesis. The left ventricular internal cavity size was moderately dilated. There is mild eccentric left ventricular hypertrophy. Left ventricular diastolic parameters are consistent with Grade I diastolic dysfunction (impaired relaxation). Normal left ventricular filling pressure. Right Ventricle: The right ventricular size  is normal. No increase in right ventricular wall thickness. Right ventricular systolic function is normal. Tricuspid regurgitation signal is inadequate for assessing PA pressure. Left Atrium: Left atrial size was moderately dilated. Right Atrium: Right atrial size was normal in size. Pericardium: There is no evidence of pericardial effusion. Mitral Valve: The mitral valve is normal in structure. No evidence of mitral valve regurgitation. Tricuspid Valve: The tricuspid valve is normal in structure. Tricuspid valve regurgitation is not demonstrated. Aortic Valve: The aortic valve is tricuspid. Aortic valve regurgitation is not visualized. No aortic stenosis is present. Pulmonic Valve: The pulmonic valve was grossly normal. Pulmonic valve regurgitation is trivial. Aorta: Aortic dilatation noted. There is borderline dilatation of the aortic root, measuring 38 mm. Venous: The inferior vena cava is normal in size with greater than 50% respiratory variability, suggesting right atrial pressure of 3 mmHg. IAS/Shunts: No atrial level shunt detected by color flow Doppler. Additional Comments: A device lead is visualized in the right ventricle.  LEFT VENTRICLE PLAX 2D LVIDd:         6.40 cm      Diastology LVIDs:         3.90 cm      LV e' medial:    12.50 cm/s LV PW:         1.20 cm      LV E/e' medial:  4.6 LV IVS:        1.20 cm      LV e' lateral:   7.40 cm/s LVOT diam:     2.50 cm      LV E/e' lateral: 7.8 LV SV:         64 LV SV Index:   27 LVOT Area:     4.91 cm  LV Volumes (MOD) LV vol d, MOD A4C: 168.0 ml LV vol s, MOD A4C: 99.8 ml LV SV MOD A4C:     168.0 ml  RIGHT VENTRICLE             IVC RV Basal diam:  2.80 cm     IVC diam: 2.10 cm RV S prime:     20.80 cm/s TAPSE (M-mode): 1.8 cm LEFT ATRIUM             Index        RIGHT ATRIUM           Index LA diam:        4.60 cm 1.92 cm/m   RA Area:     18.90 cm LA Vol (A2C):   64.8 ml 27.11 ml/m  RA Volume:   46.90 ml  19.62 ml/m LA Vol (A4C):   78.7 ml 32.93 ml/m LA Biplane Vol: 75.9 ml 31.76 ml/m  AORTIC VALVE LVOT Vmax:   80.70 cm/s LVOT Vmean:  54.000 cm/s LVOT VTI:    0.130 m  AORTA Ao Root diam: 3.80 cm Ao Asc diam:  3.30 cm MITRAL VALVE MV Area (PHT): 5.02 cm    SHUNTS MV Decel Time: 151 msec    Systemic VTI:  0.13 m MV E velocity: 57.60 cm/s  Systemic Diam: 2.50 cm MV A velocity: 80.70 cm/s MV E/A ratio:  0.71 Mihai Croitoru MD Electronically signed by Sanda Klein MD Signature Date/Time: 05/06/2022/4:28:46 PM    Final      Medications:     Scheduled Medications:  aspirin EC  81 mg Oral Daily   atorvastatin  10 mg Oral Daily   Chlorhexidine Gluconate Cloth  6 each Topical Daily  heparin  5,000 Units Subcutaneous Q8H   insulin aspart  0-15 Units Subcutaneous Q4H   insulin detemir  10 Units Subcutaneous BID   potassium chloride  20 mEq Oral Once   tamsulosin  0.4 mg Oral Daily    Infusions:  piperacillin-tazobactam (ZOSYN)  IV Stopped (05/07/22 0911)    PRN Medications: acetaminophen, Gerhardt's butt cream, HYDROmorphone, levalbuterol, [COMPLETED] loperamide **FOLLOWED BY** loperamide, mouth rinse, oxyCODONE    Patient Profile   56 y/o AAM w/ h/o chronic systolic heart failure due to NICM, s/p ICD, admitted w/ VT storm treated w/ multiple ICD shocks w/ witnessed VT arrest while in ED treated w/ CPR and defib, all in setting of metabolic derangement/hypokalemia from GI loses. AHF team consulted for assistance w/ med management in setting of worsening renal function and soft BP, concerning for potential low output HF   Assessment/Plan   1. VT Storm - had 22 ICD shocks and 2 ATP,  10 aborted shocks - in setting of hypokalemia from GI loses, initial K 2.8 (suspected etiology) - loaded w/ IV amiodarone, K corrected - VT has been quiescent since 11/25 w/ repletion of K.  - Amio stopped 11/28. No further VT  - Echo stable, EF 30-35%  - doubt ischemic component. Normal cath in 2015. No CP. + mild HS trop elevation but flat trend, most c/w demand ischemia    2. Chronic Systolic Heart Failure  - NICM - Echo 07/2013 EF 20-25%. RV normal>> LHC normal cors. - Repeat Echo 5/15 EF 30-35% - s/p ICD 7/15 - Echo 8/22, EF 30%, RV normal - Echo this admit stable, EF 30-35%, IVC not dilated  - labs not c/w low output, Lactic acid and LFTs nl  - SCr remains elevated in setting of GI losses but improving. - continue to hold diuretics and GDMT for now. Can use hydral if becomes hypertensive   - no SGLT2 w/ perineal infection    3. AKI on Stage IIIb CKD - b/l SCr ~2.0 - elevated this admit, ~3.0 - suspect due to dehydration from GI losses  - hold diuretics for now/ avoid hypotension   - follow BMP closely.      4. Right gluteal cellulitis and wound - CT scan with no evidence for abscess or drainable fluid collection - GS following - c/w abx, on Zosyn    5. Type DM - insulin per primary team   6. New Polyarticular Pain  - left ankle and rt knee - Uric acid and plain films ordered - ? Reactive arthritis 2/2 possible GI and GU infections     Length of Stay: [address] PA-C  05/07/2022, 11:48 AM  Advanced Heart Failure Team Pager [phone] (M-F; 7a - 5p)  Please contact [facility] for night-coverage after hours (5p -7a ) and weekends on [email or web address]

## 2022-05-07 NOTE — Progress Notes (Signed)
Patient arrived at the unit,CCMD notified,CHG bath given.Patient oriented to the unit,patient oriented X4.no complains of pain.

## 2022-05-07 NOTE — Progress Notes (Signed)
Subjective: Patient says his buttock pain is slightly improved.  No further VT   Objective: Vital signs in last 24 hours: Temp:  [98 F (36.7 C)-102.1 F (38.9 C)] 98.2 F (36.8 C) (11/29 0000) Pulse Rate:  [73-88] 84 (11/29 0700) Resp:  [15-67] 28 (11/29 0700) BP: (96-130)/(44-87) 127/73 (11/29 0700) SpO2:  [91 %-97 %] 94 % (11/29 0700) Weight:  [119.7 kg] 119.7 kg (11/29 0500) Last BM Date : 05/07/22  Intake/Output from previous day: 11/28 0701 - 11/29 0700 In: 1627.2 [P.O.:435; I.V.:1021.1; IV Piggyback:171.1] Out: 1100 [Urine:1100] Intake/Output this shift: No intake/output data recorded.  PE: Skin: continued progression of induration, erythema, and warmth to this area on his right gluteus.  More small pinpoint pustules and now with some vesicles of perineum, but no obvious drainable collection   Lab Results:  Recent Labs    05/06/22 1810 05/07/22 0535  WBC 14.6* 13.9*  HGB 12.5* 11.3*  HCT 36.3* 33.8*  PLT 247 256   BMET Recent Labs    05/06/22 1645 05/07/22 0535  NA 135 132*  K 3.9 3.8  CL 101 103  CO2 21* 19*  GLUCOSE 163* 142*  BUN 39* 37*  CREATININE 2.86* 2.74*  CALCIUM 8.5* 7.9*   PT/INR No results for input(s): "LABPROT", "INR" in the last 72 hours. CMP     Component Value Date/Time   NA 132 (L) 05/07/2022 0535   NA 135 01/09/2022 1002   K 3.8 05/07/2022 0535   CL 103 05/07/2022 0535   CO2 19 (L) 05/07/2022 0535   GLUCOSE 142 (H) 05/07/2022 0535   BUN 37 (H) 05/07/2022 0535   BUN 19 01/09/2022 1002   CREATININE 2.74 (H) 05/07/2022 0535   CREATININE 1.68 (H) 04/24/2016 1502   CALCIUM 7.9 (L) 05/07/2022 0535   PROT 6.7 05/06/2022 1645   PROT 8.0 08/26/2021 1608   ALBUMIN 2.5 (L) 05/06/2022 1645   ALBUMIN 4.5 08/26/2021 1608   AST 40 05/06/2022 1645   ALT 30 05/06/2022 1645   ALKPHOS 62 05/06/2022 1645   BILITOT 1.0 05/06/2022 1645   BILITOT 0.6 08/26/2021 1608   GFRNONAA 26 (L) 05/07/2022 0535   GFRNONAA >89 12/18/2014  1455   GFRAA 53 (L) 07/17/2020 1705   GFRAA >89 12/18/2014 1455   Lipase     Component Value Date/Time   LIPASE 40 06/20/2013 1156       Studies/Results: ECHOCARDIOGRAM COMPLETE  Result Date: 05/06/2022    ECHOCARDIOGRAM REPORT   Patient Name:   [name] Date of Exam: 05/06/2022 Medical Rec #:  [id number]      Height:       72.0 in Accession #:    [id number]     Weight:       262.1 lb Date of Birth:  [birth year]      BSA:          2.390 m Patient Age:    56 years       BP:           102/75 mmHg Patient Gender: M              HR:           89 bpm. Exam Location:  Inpatient Procedure: 2D Echo, Color Doppler and Cardiac Doppler Indications:    ventricular tachycardia  History:        Patient has prior history of Echocardiogram examinations, most  recent 02/01/2021. Cardiomyopathy, Defibrillator, chronic kidney                 disease; Risk Factors:Former Smoker and Diabetes.  Sonographer:    Johny Chess RDCS Referring Phys: [id number] Shirley Friar  Sonographer Comments: Patient is obese. Image acquisition challenging due to patient body habitus and Image acquisition challenging due to respiratory motion. IMPRESSIONS  1. Left ventricular ejection fraction, by estimation, is 30 to 35%. The left ventricle has moderately decreased function. The left ventricle demonstrates global hypokinesis. The left ventricular internal cavity size was moderately dilated. There is mild  eccentric left ventricular hypertrophy. Left ventricular diastolic parameters are consistent with Grade I diastolic dysfunction (impaired relaxation).  2. Right ventricular systolic function is normal. The right ventricular size is normal. Tricuspid regurgitation signal is inadequate for assessing PA pressure.  3. Left atrial size was moderately dilated.  4. The mitral valve is normal in structure. No evidence of mitral valve regurgitation.  5. The aortic valve is tricuspid. Aortic valve regurgitation is not  visualized. No aortic stenosis is present.  6. Aortic dilatation noted. There is borderline dilatation of the aortic root, measuring 38 mm.  7. The inferior vena cava is normal in size with greater than 50% respiratory variability, suggesting right atrial pressure of 3 mmHg. Comparison(s): No significant change from prior study. Prior images reviewed side by side. FINDINGS  Left Ventricle: Left ventricular ejection fraction, by estimation, is 30 to 35%. The left ventricle has moderately decreased function. The left ventricle demonstrates global hypokinesis. The left ventricular internal cavity size was moderately dilated. There is mild eccentric left ventricular hypertrophy. Left ventricular diastolic parameters are consistent with Grade I diastolic dysfunction (impaired relaxation). Normal left ventricular filling pressure. Right Ventricle: The right ventricular size is normal. No increase in right ventricular wall thickness. Right ventricular systolic function is normal. Tricuspid regurgitation signal is inadequate for assessing PA pressure. Left Atrium: Left atrial size was moderately dilated. Right Atrium: Right atrial size was normal in size. Pericardium: There is no evidence of pericardial effusion. Mitral Valve: The mitral valve is normal in structure. No evidence of mitral valve regurgitation. Tricuspid Valve: The tricuspid valve is normal in structure. Tricuspid valve regurgitation is not demonstrated. Aortic Valve: The aortic valve is tricuspid. Aortic valve regurgitation is not visualized. No aortic stenosis is present. Pulmonic Valve: The pulmonic valve was grossly normal. Pulmonic valve regurgitation is trivial. Aorta: Aortic dilatation noted. There is borderline dilatation of the aortic root, measuring 38 mm. Venous: The inferior vena cava is normal in size with greater than 50% respiratory variability, suggesting right atrial pressure of 3 mmHg. IAS/Shunts: No atrial level shunt detected by color flow  Doppler. Additional Comments: A device lead is visualized in the right ventricle.  LEFT VENTRICLE PLAX 2D LVIDd:         6.40 cm      Diastology LVIDs:         3.90 cm      LV e' medial:    12.50 cm/s LV PW:         1.20 cm      LV E/e' medial:  4.6 LV IVS:        1.20 cm      LV e' lateral:   7.40 cm/s LVOT diam:     2.50 cm      LV E/e' lateral: 7.8 LV SV:         64 LV SV Index:   27 LVOT Area:  4.91 cm  LV Volumes (MOD) LV vol d, MOD A4C: 168.0 ml LV vol s, MOD A4C: 99.8 ml LV SV MOD A4C:     168.0 ml RIGHT VENTRICLE             IVC RV Basal diam:  2.80 cm     IVC diam: 2.10 cm RV S prime:     20.80 cm/s TAPSE (M-mode): 1.8 cm LEFT ATRIUM             Index        RIGHT ATRIUM           Index LA diam:        4.60 cm 1.92 cm/m   RA Area:     18.90 cm LA Vol (A2C):   64.8 ml 27.11 ml/m  RA Volume:   46.90 ml  19.62 ml/m LA Vol (A4C):   78.7 ml 32.93 ml/m LA Biplane Vol: 75.9 ml 31.76 ml/m  AORTIC VALVE LVOT Vmax:   80.70 cm/s LVOT Vmean:  54.000 cm/s LVOT VTI:    0.130 m  AORTA Ao Root diam: 3.80 cm Ao Asc diam:  3.30 cm MITRAL VALVE MV Area (PHT): 5.02 cm    SHUNTS MV Decel Time: 151 msec    Systemic VTI:  0.13 m MV E velocity: 57.60 cm/s  Systemic Diam: 2.50 cm MV A velocity: 80.70 cm/s MV E/A ratio:  0.71 Mihai Croitoru MD Electronically signed by Sanda Klein MD Signature Date/Time: 05/06/2022/4:28:46 PM    Final    CT PELVIS WO CONTRAST  Result Date: 05/05/2022 CLINICAL DATA:  Soft tissue infection suspected, pelvis, xray done right gluteal edema, cellulitis, eval for infection EXAM: CT PELVIS WITHOUT CONTRAST TECHNIQUE: Multidetector CT imaging of the pelvis was performed following the standard protocol without intravenous contrast. RADIATION DOSE REDUCTION: This exam was performed according to the departmental dose-optimization program which includes automated exposure control, adjustment of the mA and/or kV according to patient size and/or use of iterative reconstruction technique.  COMPARISON:  None Available. FINDINGS: Urinary Tract:  No abnormality visualized. Bowel: Scattered colonic diverticulosis. Otherwise unremarkable visualized pelvic bowel loops. Vascular/Lymphatic: Atherosclerotic plaque. No pathologically enlarged lymph nodes. No significant vascular abnormality seen. Reproductive: Prostate measures 4.8 cm. No mass or other significant abnormality Other: No intraperitoneal free fluid. No intraperitoneal free gas. No organized fluid collection. Musculoskeletal: Diffuse subcutaneus soft tissue edema of the right gluteal soft tissues with no emphysema or organized fluid collection. Associated slight haziness of the right gluteal maximus. Slight extension of the fat stranding to the perineal region and possible right scrotum. Some mild stranding of the left gluteal soft tissues may be normal. No cortical erosion or destruction. No suspicious bone lesions identified. No acute displaced fracture or dislocation. At least mild degenerative changes of the bilateral hips. IMPRESSION: 1. Diffuse subcutaneus soft tissue edema of the right gluteal soft tissues with no emphysema or organized fluid collection. Associated slight haziness of the right gluteal maximus suggestive of possible developing associated myositis. Slight extension of the fat stranding to the perineal region and possible right scrotum. Please note a necrotizing fasciitis cannot be excluded as this is a clinical diagnosis. 2. Other imaging findings of potential clinical significance: Colonic diverticulosis with no acute diverticulitis. Electronically Signed   By: Iven Finn M.D.   On: 05/05/2022 20:37    Anti-infectives: Anti-infectives (From admission, onward)    Start     Dose/Rate Route Frequency Ordered Stop   05/05/22 1400  piperacillin-tazobactam (ZOSYN) IVPB 3.375  g        3.375 g 12.5 mL/hr over 240 Minutes Intravenous Every 8 hours 05/05/22 1158     05/05/22 1030  amoxicillin-clavulanate (AUGMENTIN)  875-125 MG per tablet 1 tablet  Status:  Discontinued        1 tablet Oral Every 12 hours 05/05/22 0936 05/05/22 1158        Assessment/Plan Right gluteal cellulitis and wound -ct scan with no evidence for abscess or drainable fluid collection.  He has evidence of soft tissue edema and stranding c/w cellulitis -cont zosyn -WBC down to 13.9K -physical exam still with more progression of pustules and vesicles of the perineum.  Still no obvious drainable collection identified.  Concerned this may develop so will make him NPO p MN incase he requires surgical intervention tomorrow. -d/w primary service     FEN - HH, NPO p MN VTE - heparin, ASA ID - zosyn   Vtach - AICD in place CHF HTN DM Obesity  I reviewed Consultant cardiology notes, hospitalist notes, last 24 h vitals and pain scores, last 48 h intake and output, last 24 h labs and trends, and last 24 h imaging results.   LOS: 4 days    Henreitta Cea , [facility] Surgery 05/07/2022, 8:53 AM Please see Amion for pager number during day hours 7:00am-4:30pm or 7:00am -11:30am on weekends

## 2022-05-07 NOTE — Progress Notes (Deleted)
Synopsis: Referred in October 2023 for chest congestion after having COVID.  Has a history of systolic heart failure from nonischemic cardiomyopathy followed by [name]. Previously followed by Dr. Lenna Gilford for chronic bronchitis and Dr. Corrie Dandy for recurrent bronchitis and OSA.  Subjective:   PATIENT ID: [name] GENDER: male DOB: 06-20-1965, MRN: [id number]   HPI  No chief complaint on file.   ***f/u PFT  Past Medical History:  Diagnosis Date   AICD (automatic cardioverter/defibrillator) present    Chronic bronchitis ([place])    "get it most q yr" (12/23/2013)   Chronic systolic (congestive) heart failure ([place])    Fracture of left humerus    a. 07/2013.   GERD (gastroesophageal reflux disease)    not at present time   Heart murmur    "born w/it"    History of renal calculi    Hypertension    Kidney stones    NICM (nonischemic cardiomyopathy) ([place])    a. 07/2013 Echo: EF 20-25%, diff HK, Gr2 DD, mild MR, mod dil LA/RA. 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   ***   CBC    Component Value Date/Time   WBC 13.9 (H) 05/07/2022 0535   RBC 3.74 (L) 05/07/2022 0535   HGB 11.3 (L) 05/07/2022 0535   HGB 14.0 08/26/2021 1608   HCT 33.8 (L) 05/07/2022 0535   HCT 40.9 08/26/2021 1608   PLT 256 05/07/2022 0535   PLT 286 08/26/2021 1608   MCV 90.4 05/07/2022 0535   MCV 88 08/26/2021 1608   MCH 30.2 05/07/2022 0535   MCHC 33.4 05/07/2022 0535   RDW 15.3 05/07/2022 0535   RDW 13.0 08/26/2021 1608   LYMPHSABS 1.7 05/05/2022 0635   LYMPHSABS 1.7 06/28/2018 1440   MONOABS 0.8 05/05/2022 0635   EOSABS 0.0 05/05/2022 0635   EOSABS 0.3 06/28/2018 1440   BASOSABS 0.1 05/05/2022 0635    BASOSABS 0.0 06/28/2018 1440     Chest imaging: March 04, 2022 chest x-ray images independently reviewed showing normal pulmonary parenchyma, enlarged cardiac silhouette, ICD implant  PFT: 2017 PFT ratio 73%, FEV1 2.36 L 64% TLC 5.68 L 75% predicted (ERV elevated), DLCO 19.77 mL/min/mmHg 54% predicted.  Shape of flow volume loop suggestive of variable upper ariway obstruction  Labs:  Path:  Echo:  Heart Catheterization:       Assessment & Plan:   No diagnosis found.  Discussion: ***   Immunizations: Immunization History  Administered Date(s) Administered   PFIZER(Purple Top)SARS-COV-2 Vaccination 10/21/2019    No current facility-administered medications for this visit. No current outpatient medications on file.  Facility-Administered Medications Ordered in Other Visits:    acetaminophen (TYLENOL) tablet 650 mg, 650 mg, Oral, Q4H PRN, Desai, Rahul P, PA-C, 650 mg at 05/07/22 1213   aspirin EC tablet 81 mg, 81 mg, Oral, Daily, Estill Cotta, NP, 81 mg at 05/07/22 0937   atorvastatin (LIPITOR) tablet 10 mg, 10 mg, Oral, Daily, Chesley Mires, MD, 10 mg at 05/07/22 2025   Chlorhexidine Gluconate Cloth 2 % PADS 6 each, 6 each, Topical, Daily, Chesley Mires, MD, 6 each at 05/07/22 0940   Gerhardt's  butt cream, , Topical, BID PRN, Jacky Kindle, MD, 1 Application at 37/35/78 1949   heparin injection 5,000 Units, 5,000 Units, Subcutaneous, Q8H, Estill Cotta, NP, 5,000 Units at 05/07/22 0454   HYDROmorphone (DILAUDID) tablet 2 mg, 2 mg, Oral, Q4H PRN, Jacky Kindle, MD, 2 mg at 05/04/22 1020   insulin aspart (novoLOG) injection 0-15 Units, 0-15 Units, Subcutaneous, Q4H, Estill Cotta, NP, 3 Units at 05/07/22 1205   insulin detemir (LEVEMIR) injection 10 Units, 10 Units, Subcutaneous, BID, Jacky Kindle, MD, 10 Units at 05/07/22 0937   levalbuterol (XOPENEX) nebulizer solution 0.63 mg, 0.63 mg, Nebulization, Q6H PRN, Chesley Mires, MD   [COMPLETED] loperamide  (IMODIUM) capsule 4 mg, 4 mg, Oral, Once, 4 mg at 05/06/22 0944 **FOLLOWED BY** loperamide (IMODIUM) capsule 2 mg, 2 mg, Oral, PRN, Shearon Stalls, Rahul P, PA-C   Oral care mouth rinse, 15 mL, Mouth Rinse, PRN, Chesley Mires, MD   oxyCODONE (Oxy IR/ROXICODONE) immediate release tablet 5 mg, 5 mg, Oral, Q4H PRN, Julian Hy, DO, 2.5 mg at 05/05/22 2018   piperacillin-tazobactam (ZOSYN) IVPB 3.375 g, 3.375 g, Intravenous, Q8H, Saverio Danker, PA-C, Stopped at 05/07/22 0911   tamsulosin (FLOMAX) capsule 0.4 mg, 0.4 mg, Oral, Daily, Noemi Chapel P, DO, 0.4 mg at 05/07/22 [phone]

## 2022-05-07 NOTE — Progress Notes (Addendum)
NAME:  [name], MRN:  [id number], DOB:  09/04/1965, LOS: 4 ADMISSION DATE:  05/03/2022, CONSULTATION DATE:  11/25 REFERRING MD:  Kommor CHIEF COMPLAINT:  Diarrhea   History of Present Illness:  [name] is an 56 y.o. M who presented to the [facility] ED via EMS with a chief complaint of diarrhea and ICD shocks.  Patient dose of trulicity increased on 71/06. Reports since increase having diarrhea daily. Denies nausea, vomiting, BRBPR, melena. Patient reportedly with ICD shock on AM on 11/25. Unable to take in PO on 11/25. EMS called.  In the [facility] ED he had a witnessed VTACH arrest with CPR and ROSC achieved with one shock (30 seconds). Return to baseline neurological status. K 2.8, Creat 2.84 (baseline 1.8-2.3), WBC 19.5. Tmax 99.1. Troponin 172. He was given diludid for chest pain post CPR. He developed hypotension (74/40). Cardiology consulted.   PCCM consulted for admission.  Pertinent  Medical History  NICM, Systolic CHF s/p ICD placement, DM2, CKD stage 3, obesity, BPH, GERD, chronic LBP secondary to spondylosis and spinal stenosis.  Significant Hospital Events: Including procedures, antibiotic start and stop dates in addition to other pertinent events   11/25 Presented to [facility] 11/27 CT pelvis > subcutaneous soft tissue edema of right gluteal soft tissues with no emphysema or organized fluid collection, slight extension of fat stranding towards perineal region and possible right scrotum.  Interim History / Subjective:  Ongoing diarrhea though slightly less frequent. Buttock pain slightly improved though area of edema and warmth is slightly worse per RN (did not personally examine as pt uncomfortable; therefore, did not ask him to turn over again).  Objective   Blood pressure 127/73, pulse 84, temperature 98.2 F (36.8 C), temperature source Oral, resp. rate (!) 28, height 6' (1.829 m), weight 119.7 kg, SpO2 94 %.        Intake/Output Summary (Last 24 hours) at  05/07/2022 0858 Last data filed at 05/07/2022 0700 Gross per 24 hour  Intake 1627.21 ml  Output 1100 ml  Net 527.21 ml    Filed Weights   05/04/22 0437 05/05/22 0500 05/07/22 0500  Weight: 116.8 kg 118.9 kg 119.7 kg    Examination: General: Adult male, in NAD. Neuro: A&O x 3, no deficits. HEENT: [place]/AT. Sclerae anicteric. EOMI. Cardiovascular: RRR, no M/R/G.  Lungs: Respirations even and unlabored.  CTA bilaterally, No W/R/R. Abdomen: BS x 4, soft, NT/ND.  Musculoskeletal: No gross deformities, no edema.  Skin: Right gluteal cleft area of induration and tenderness extending laterally, warm to touch, presumed cellulitis vs abscess (per surgery, area has slightly increased in size). Otherwise intact, warm, no rashes.  Assessment & Plan:   VT cardiac arrest likely in the setting of hypokalemia - no further VT. Nonischemic cardiomyopathy with chronic combined heart failure s/p AICD - repeat Echo 11/28 with EF 30-35%, G1D. Demand cardiac ischemia in the setting of frequent AICD firing. - Cardiology, EP and heart failure team following. - Amiodarone stopped per EP 11/28. - GDMT to be started as able.  Hypovolemic shock - resolved. Diarrhea, medication induced - Patient dose of trulicity increased on 26/94. Reports since increase having diarrhea daily up to 20 episodes per day. Diarrhea frequency improved since Trulicity has been stopped. C.diff negative. - Encourage oral fluid intake. - Continue Loperamide.  AKI on CKD stage IIIb due to dehydration - improving. Hypokalemia/Hypomagnesemia - resolved after supplementation. - Continue aggressive electrolyte supplement and monitor.  Poorly controlled diabetes type 2 with hyperglycemia, complicated with  diabetic neuropathy. - Continue SSI, Levemir. - Hold Trulicity.  Obesity. - Dietitian consulted.  Right gluteal abscess vs cellulitis - no abscess or drainable collection. CT without gas formation. - Surgery following, hoping to  avoid OR but monitoring closely and have made NPO today incase he will require surgical intervention 11/30. - Continue Zosyn.   Can transfer out of ICU to floor (cardiac tele). Will ask TRH to assume care in AM 11/30.  Best Practice (right click and "Reselect all SmartList Selections" daily)   Diet/type: Heart healthy DVT prophylaxis: SCD GI prophylaxis: N/A Lines: N/A Foley:  N/A Code Status:  full code Last date of multidisciplinary goals of care discussion [Full scope]  CC time: N/A.   Montey Hora, [facility] For pager details, please see AMION or use Epic chat  After 1900, please call [facility] for cross coverage needs 05/07/2022, 8:58 AM

## 2022-05-08 ENCOUNTER — Inpatient Hospital Stay (HOSPITAL_COMMUNITY): Payer: Medicaid Other

## 2022-05-08 DIAGNOSIS — I472 Ventricular tachycardia, unspecified: Secondary | ICD-10-CM | POA: Diagnosis not present

## 2022-05-08 DIAGNOSIS — N1831 Chronic kidney disease, stage 3a: Secondary | ICD-10-CM | POA: Diagnosis not present

## 2022-05-08 DIAGNOSIS — I469 Cardiac arrest, cause unspecified: Secondary | ICD-10-CM | POA: Diagnosis not present

## 2022-05-08 DIAGNOSIS — E875 Hyperkalemia: Secondary | ICD-10-CM | POA: Diagnosis not present

## 2022-05-08 DIAGNOSIS — N179 Acute kidney failure, unspecified: Secondary | ICD-10-CM | POA: Diagnosis not present

## 2022-05-08 DIAGNOSIS — I5022 Chronic systolic (congestive) heart failure: Secondary | ICD-10-CM | POA: Diagnosis not present

## 2022-05-08 DIAGNOSIS — R079 Chest pain, unspecified: Secondary | ICD-10-CM | POA: Diagnosis not present

## 2022-05-08 DIAGNOSIS — L03317 Cellulitis of buttock: Secondary | ICD-10-CM | POA: Diagnosis not present

## 2022-05-08 LAB — BASIC METABOLIC PANEL
Anion gap: 10 (ref 5–15)
BUN: 30 mg/dL — ABNORMAL HIGH (ref 6–20)
CO2: 22 mmol/L (ref 22–32)
Calcium: 8.5 mg/dL — ABNORMAL LOW (ref 8.9–10.3)
Chloride: 104 mmol/L (ref 98–111)
Creatinine, Ser: 2.51 mg/dL — ABNORMAL HIGH (ref 0.61–1.24)
GFR, Estimated: 29 mL/min — ABNORMAL LOW (ref >60)
Glucose, Bld: 134 mg/dL — ABNORMAL HIGH (ref 70–99)
Potassium: 3.3 mmol/L — ABNORMAL LOW (ref 3.5–5.1)
Sodium: 136 mmol/L (ref 135–145)

## 2022-05-08 LAB — MAGNESIUM: Magnesium: 2.5 mg/dL — ABNORMAL HIGH (ref 1.7–2.4)

## 2022-05-08 LAB — GLUCOSE, CAPILLARY
Glucose-Capillary: 112 mg/dL — ABNORMAL HIGH (ref 70–99)
Glucose-Capillary: 118 mg/dL — ABNORMAL HIGH (ref 70–99)
Glucose-Capillary: 156 mg/dL — ABNORMAL HIGH (ref 70–99)
Glucose-Capillary: 184 mg/dL — ABNORMAL HIGH (ref 70–99)
Glucose-Capillary: 208 mg/dL — ABNORMAL HIGH (ref 70–99)
Glucose-Capillary: 226 mg/dL — ABNORMAL HIGH (ref 70–99)

## 2022-05-08 LAB — CBC
HCT: 31.9 % — ABNORMAL LOW (ref 39.0–52.0)
Hemoglobin: 11 g/dL — ABNORMAL LOW (ref 13.0–17.0)
MCH: 30.7 pg (ref 26.0–34.0)
MCHC: 34.5 g/dL (ref 30.0–36.0)
MCV: 89.1 fL (ref 80.0–100.0)
Platelets: 286 10*3/uL (ref 150–400)
RBC: 3.58 MIL/uL — ABNORMAL LOW (ref 4.22–5.81)
RDW: 15.2 % (ref 11.5–15.5)
WBC: 15 10*3/uL — ABNORMAL HIGH (ref 4.0–10.5)
nRBC: 0 % (ref 0.0–0.2)

## 2022-05-08 MED ORDER — POTASSIUM CHLORIDE CRYS ER 20 MEQ PO TBCR: 20.0000 meq | EXTENDED_RELEASE_TABLET | Freq: Once | ORAL | Status: DC

## 2022-05-08 MED ORDER — POTASSIUM CHLORIDE CRYS ER 20 MEQ PO TBCR
20.0000 meq | EXTENDED_RELEASE_TABLET | Freq: Two times a day (BID) | ORAL | Status: DC
  Administered 2022-05-08 – 2022-05-10 (×4): 20 meq via ORAL
  Filled 2022-05-08 (×4): qty 1

## 2022-05-08 MED ORDER — POTASSIUM CHLORIDE CRYS ER 20 MEQ PO TBCR: 20.0000 meq | EXTENDED_RELEASE_TABLET | Freq: Every day | ORAL | Status: DC

## 2022-05-08 MED ORDER — POTASSIUM CHLORIDE CRYS ER 20 MEQ PO TBCR
40.0000 meq | EXTENDED_RELEASE_TABLET | Freq: Once | ORAL | Status: AC
  Administered 2022-05-08: 40 meq via ORAL
  Filled 2022-05-08: qty 2

## 2022-05-08 NOTE — Progress Notes (Signed)
[facility] Progress Note     Subjective: CC-  Continued right buttock pain but somewhat better than yesterday WBC up 15, afebrile States that he is starving  Objective: Vital signs in last 24 hours: Temp:  [98.2 F (36.8 C)-99.8 F (37.7 C)] 98.7 F (37.1 C) (11/30 0504) Pulse Rate:  [72-84] 76 (11/30 0504) Resp:  [18-29] 20 (11/30 0504) BP: (100-121)/(45-70) 114/67 (11/30 0504) SpO2:  [94 %-98 %] 94 % (11/30 0504) Weight:  [119.5 kg] 119.5 kg (11/30 0500) Last BM Date : 05/07/22  Intake/Output from previous day: 11/29 0701 - 11/30 0700 In: 902.3 [P.O.:780; IV Piggyback:122.3] Out: 600 [Urine:600] Intake/Output this shift: No intake/output data recorded.  PE: Gen: Alert, NAD Skin: persistent right gluteal induration, erythema, and warmth. Small pinpoint pustules noted on the perineum. No obvious fluctuance or drainable fluid collection   Lab Results:  Recent Labs    05/07/22 0535 05/08/22 0229  WBC 13.9* 15.0*  HGB 11.3* 11.0*  HCT 33.8* 31.9*  PLT 256 286   BMET Recent Labs    05/07/22 0535 05/08/22 0229  NA 132* 136  K 3.8 3.3*  CL 103 104  CO2 19* 22  GLUCOSE 142* 134*  BUN 37* 30*  CREATININE 2.74* 2.51*  CALCIUM 7.9* 8.5*   PT/INR No results for input(s): "LABPROT", "INR" in the last 72 hours. CMP     Component Value Date/Time   NA 136 05/08/2022 0229   NA 135 01/09/2022 1002   K 3.3 (L) 05/08/2022 0229   CL 104 05/08/2022 0229   CO2 22 05/08/2022 0229   GLUCOSE 134 (H) 05/08/2022 0229   BUN 30 (H) 05/08/2022 0229   BUN 19 01/09/2022 1002   CREATININE 2.51 (H) 05/08/2022 0229   CREATININE 1.68 (H) 04/24/2016 1502   CALCIUM 8.5 (L) 05/08/2022 0229   PROT 6.7 05/06/2022 1645   PROT 8.0 08/26/2021 1608   ALBUMIN 2.5 (L) 05/06/2022 1645   ALBUMIN 4.5 08/26/2021 1608   AST 40 05/06/2022 1645   ALT 30 05/06/2022 1645   ALKPHOS 62 05/06/2022 1645   BILITOT 1.0 05/06/2022 1645   BILITOT 0.6 08/26/2021 1608   GFRNONAA 29 (L)  05/08/2022 0229   GFRNONAA >89 12/18/2014 1455   GFRAA 53 (L) 07/17/2020 1705   GFRAA >89 12/18/2014 1455   Lipase     Component Value Date/Time   LIPASE 40 06/20/2013 1156       Studies/Results: DG Knee 1-2 Views Right  Result Date: 05/07/2022 CLINICAL DATA:  Right knee pain EXAM: RIGHT KNEE - 1-2 VIEW COMPARISON:  None Available. FINDINGS: Normal alignment. No acute fracture or dislocation. Corticated 8 mm ossific density seen within the posterior joint space on lateral examination may represent an intra-articular loose body. No effusion. Soft tissues are unremarkable. IMPRESSION: 1. No acute fracture or dislocation. 2. Possible 8 mm intra-articular loose body. This could be confirmed with MRI examination if indicated. Electronically Signed   By: Fidela Salisbury M.D.   On: 05/07/2022 14:24   ECHOCARDIOGRAM COMPLETE  Result Date: 05/06/2022    ECHOCARDIOGRAM REPORT   Patient Name:   [name] Date of Exam: 05/06/2022 Medical Rec #:  [id number]      Height:       72.0 in Accession #:    [id number]     Weight:       262.1 lb Date of Birth:  [birth year]      BSA:          2.390 m  Patient Age:    22 years       BP:           102/75 mmHg Patient Gender: M              HR:           89 bpm. Exam Location:  Inpatient Procedure: 2D Echo, Color Doppler and Cardiac Doppler Indications:    ventricular tachycardia  History:        Patient has prior history of Echocardiogram examinations, most                 recent 02/01/2021. Cardiomyopathy, Defibrillator, chronic kidney                 disease; Risk Factors:Former Smoker and Diabetes.  Sonographer:    Johny Chess RDCS Referring Phys: [id number] Shirley Friar  Sonographer Comments: Patient is obese. Image acquisition challenging due to patient body habitus and Image acquisition challenging due to respiratory motion. IMPRESSIONS  1. Left ventricular ejection fraction, by estimation, is 30 to 35%. The left ventricle has moderately decreased  function. The left ventricle demonstrates global hypokinesis. The left ventricular internal cavity size was moderately dilated. There is mild  eccentric left ventricular hypertrophy. Left ventricular diastolic parameters are consistent with Grade I diastolic dysfunction (impaired relaxation).  2. Right ventricular systolic function is normal. The right ventricular size is normal. Tricuspid regurgitation signal is inadequate for assessing PA pressure.  3. Left atrial size was moderately dilated.  4. The mitral valve is normal in structure. No evidence of mitral valve regurgitation.  5. The aortic valve is tricuspid. Aortic valve regurgitation is not visualized. No aortic stenosis is present.  6. Aortic dilatation noted. There is borderline dilatation of the aortic root, measuring 38 mm.  7. The inferior vena cava is normal in size with greater than 50% respiratory variability, suggesting right atrial pressure of 3 mmHg. Comparison(s): No significant change from prior study. Prior images reviewed side by side. FINDINGS  Left Ventricle: Left ventricular ejection fraction, by estimation, is 30 to 35%. The left ventricle has moderately decreased function. The left ventricle demonstrates global hypokinesis. The left ventricular internal cavity size was moderately dilated. There is mild eccentric left ventricular hypertrophy. Left ventricular diastolic parameters are consistent with Grade I diastolic dysfunction (impaired relaxation). Normal left ventricular filling pressure. Right Ventricle: The right ventricular size is normal. No increase in right ventricular wall thickness. Right ventricular systolic function is normal. Tricuspid regurgitation signal is inadequate for assessing PA pressure. Left Atrium: Left atrial size was moderately dilated. Right Atrium: Right atrial size was normal in size. Pericardium: There is no evidence of pericardial effusion. Mitral Valve: The mitral valve is normal in structure. No evidence  of mitral valve regurgitation. Tricuspid Valve: The tricuspid valve is normal in structure. Tricuspid valve regurgitation is not demonstrated. Aortic Valve: The aortic valve is tricuspid. Aortic valve regurgitation is not visualized. No aortic stenosis is present. Pulmonic Valve: The pulmonic valve was grossly normal. Pulmonic valve regurgitation is trivial. Aorta: Aortic dilatation noted. There is borderline dilatation of the aortic root, measuring 38 mm. Venous: The inferior vena cava is normal in size with greater than 50% respiratory variability, suggesting right atrial pressure of 3 mmHg. IAS/Shunts: No atrial level shunt detected by color flow Doppler. Additional Comments: A device lead is visualized in the right ventricle.  LEFT VENTRICLE PLAX 2D LVIDd:         6.40 cm  Diastology LVIDs:         3.90 cm      LV e' medial:    12.50 cm/s LV PW:         1.20 cm      LV E/e' medial:  4.6 LV IVS:        1.20 cm      LV e' lateral:   7.40 cm/s LVOT diam:     2.50 cm      LV E/e' lateral: 7.8 LV SV:         64 LV SV Index:   27 LVOT Area:     4.91 cm  LV Volumes (MOD) LV vol d, MOD A4C: 168.0 ml LV vol s, MOD A4C: 99.8 ml LV SV MOD A4C:     168.0 ml RIGHT VENTRICLE             IVC RV Basal diam:  2.80 cm     IVC diam: 2.10 cm RV S prime:     20.80 cm/s TAPSE (M-mode): 1.8 cm LEFT ATRIUM             Index        RIGHT ATRIUM           Index LA diam:        4.60 cm 1.92 cm/m   RA Area:     18.90 cm LA Vol (A2C):   64.8 ml 27.11 ml/m  RA Volume:   46.90 ml  19.62 ml/m LA Vol (A4C):   78.7 ml 32.93 ml/m LA Biplane Vol: 75.9 ml 31.76 ml/m  AORTIC VALVE LVOT Vmax:   80.70 cm/s LVOT Vmean:  54.000 cm/s LVOT VTI:    0.130 m  AORTA Ao Root diam: 3.80 cm Ao Asc diam:  3.30 cm MITRAL VALVE MV Area (PHT): 5.02 cm    SHUNTS MV Decel Time: 151 msec    Systemic VTI:  0.13 m MV E velocity: 57.60 cm/s  Systemic Diam: 2.50 cm MV A velocity: 80.70 cm/s MV E/A ratio:  0.71 Mihai Croitoru MD Electronically signed by Sanda Klein MD Signature Date/Time: 05/06/2022/4:28:46 PM    Final     Anti-infectives: Anti-infectives (From admission, onward)    Start     Dose/Rate Route Frequency Ordered Stop   05/05/22 1400  piperacillin-tazobactam (ZOSYN) IVPB 3.375 g        3.375 g 12.5 mL/hr over 240 Minutes Intravenous Every 8 hours 05/05/22 1158     05/05/22 1030  amoxicillin-clavulanate (AUGMENTIN) 875-125 MG per tablet 1 tablet  Status:  Discontinued        1 tablet Oral Every 12 hours 05/05/22 0936 05/05/22 1158        Assessment/Plan Right gluteal cellulitis and wound -ct scan 11/27 with no evidence for abscess or drainable fluid collection.  He has evidence of soft tissue edema and stranding c/w cellulitis -WBC slightly up today.  -Still no obvious fluctuance or drainable abscess on physical exam, but with worsening WBC will obtain u/s today to evaluate for deeper collection. Patient states that he is starving and does not want to remain NPO even if the u/s shows something we can intervene on, so I reordered his diet. Continue zosyn   FEN - HH/CM diet VTE - sq heparin, ASA ID - zosyn   Vtach - AICD in place CHF HTN DM Obesity   I reviewed Consultant cardiology notes, hospitalist notes, last 24 h vitals and pain scores, last 48 h intake and  output, last 24 h labs and trends, and last 24 h imaging results.    LOS: 5 days    [facility] 05/08/2022, 8:32 AM Please see Amion for pager number during day hours 7:00am-4:30pm

## 2022-05-08 NOTE — Plan of Care (Signed)

## 2022-05-08 NOTE — Consult Note (Addendum)
Advanced Heart Failure Rounding Note  PCP-Cardiologist: [name], MD   Subjective:    No further VT.  Labs not c/w low output. Lactic acid and LFTs ok. SCr starting to improve 3.08>>2.86   Echo 11/28 EF 30-35%, RV normal. The inferior vena cava is normal in size with greater than 50% respiratory variability, suggesting right atrial pressure of 3 mmHg. BNP 487.   C/w diarrhea. GI panel and C-diff negative.   Remains on Zosyn for buttock wound. NPO for possible debridement today per GS  Continues with polyarticular pain, left ankle and rt knee. Started on colchicine.    Feels fine, laying in bed. No complaints.   Objective:   Weight Range: 119.5 kg Body mass index is 35.73 kg/m.   Vital Signs:   Temp:  [98.2 F (36.8 C)-99.8 F (37.7 C)] 98.7 F (37.1 C) (11/30 0504) Pulse Rate:  [72-84] 76 (11/30 0504) Resp:  [18-29] 20 (11/30 0504) BP: (100-121)/(45-70) 114/67 (11/30 0504) SpO2:  [94 %-98 %] 94 % (11/30 0504) Weight:  [119.5 kg] 119.5 kg (11/30 0500) Last BM Date : 05/07/22  Weight change: Filed Weights   05/05/22 0500 05/07/22 0500 05/08/22 0500  Weight: 118.9 kg 119.7 kg 119.5 kg    Intake/Output:   Intake/Output Summary (Last 24 hours) at 05/08/2022 0709 Last data filed at 05/08/2022 0100 Gross per 24 hour  Intake 902.26 ml  Output 600 ml  Net 302.26 ml       Physical Exam    General:  well appearing.  No respiratory difficulty HEENT: normal Neck: supple. JVD difficult to access, thick neck. Carotids 2+ bilat; no bruits. No lymphadenopathy or thyromegaly appreciated. Cor: PMI nondisplaced. Regular rate & rhythm. No rubs, gallops or murmurs. Lungs: clear Abdomen: soft, nontender, nondistended. No hepatosplenomegaly. No bruits or masses. Good bowel sounds. Extremities: no cyanosis, clubbing, rash, edema. Bilateral feet/knees sensitive to touch Neuro: alert & oriented x 3, cranial nerves grossly intact. moves all 4 extremities w/o  difficulty. Affect pleasant.   Telemetry   NSR 70s, 8 beat run NSVT (Personally reviewed)    EKG    No new EKG to review   Labs    CBC Recent Labs    05/07/22 0535 05/08/22 0229  WBC 13.9* 15.0*  HGB 11.3* 11.0*  HCT 33.8* 31.9*  MCV 90.4 89.1  PLT 256 710    Basic Metabolic Panel Recent Labs    05/07/22 0535 05/08/22 0229  NA 132* 136  K 3.8 3.3*  CL 103 104  CO2 19* 22  GLUCOSE 142* 134*  BUN 37* 30*  CREATININE 2.74* 2.51*  CALCIUM 7.9* 8.5*  MG 2.5* 2.5*    Liver Function Tests Recent Labs    05/06/22 1645  AST 40  ALT 30  ALKPHOS 62  BILITOT 1.0  PROT 6.7  ALBUMIN 2.5*    No results for input(s): "LIPASE", "AMYLASE" in the last 72 hours. Cardiac Enzymes No results for input(s): "CKTOTAL", "CKMB", "CKMBINDEX", "TROPONINI" in the last 72 hours.  BNP: BNP (last 3 results) Recent Labs    02/01/22 1702 05/03/22 2010 05/06/22 1736  BNP 11.8 350.8* 487.7*     ProBNP (last 3 results) Recent Labs    01/09/22 1002 03/21/22 1452  PROBNP 97 18.0      D-Dimer No results for input(s): "DDIMER" in the last 72 hours. Hemoglobin A1C No results for input(s): "HGBA1C" in the last 72 hours. Fasting Lipid Panel No results for input(s): "CHOL", "HDL", "LDLCALC", "TRIG", "CHOLHDL", "LDLDIRECT"  in the last 72 hours. Thyroid Function Tests Recent Labs    05/06/22 1736  TSH 1.156     Other results:   Imaging    DG Knee 1-2 Views Right  Result Date: 05/07/2022 CLINICAL DATA:  Right knee pain EXAM: RIGHT KNEE - 1-2 VIEW COMPARISON:  None Available. FINDINGS: Normal alignment. No acute fracture or dislocation. Corticated 8 mm ossific density seen within the posterior joint space on lateral examination may represent an intra-articular loose body. No effusion. Soft tissues are unremarkable. IMPRESSION: 1. No acute fracture or dislocation. 2. Possible 8 mm intra-articular loose body. This could be confirmed with MRI examination if indicated.  Electronically Signed   By: Fidela Salisbury M.D.   On: 05/07/2022 14:24     Medications:     Scheduled Medications:  aspirin EC  81 mg Oral Daily   atorvastatin  10 mg Oral Daily   Chlorhexidine Gluconate Cloth  6 each Topical Daily   colchicine  0.3 mg Oral BID   fiber supplement (BANATROL TF)  60 mL Oral BID   heparin  5,000 Units Subcutaneous Q8H   insulin aspart  0-15 Units Subcutaneous Q4H   insulin detemir  10 Units Subcutaneous BID   sertraline  12.5 mg Oral Daily   tamsulosin  0.4 mg Oral Daily    Infusions:  piperacillin-tazobactam (ZOSYN)  IV 3.375 g (05/08/22 0619)    PRN Medications: acetaminophen, Gerhardt's butt cream, HYDROmorphone, levalbuterol, [COMPLETED] loperamide **FOLLOWED BY** loperamide, mouth rinse, oxyCODONE    Patient Profile   56 y/o AAM w/ h/o chronic systolic heart failure due to NICM, s/p ICD, admitted w/ VT storm treated w/ multiple ICD shocks w/ witnessed VT arrest while in ED treated w/ CPR and defib, all in setting of metabolic derangement/hypokalemia from GI loses. AHF team consulted for assistance w/ med management in setting of worsening renal function and soft BP, concerning for potential low output HF   Assessment/Plan   1. VT Storm - had 22 ICD shocks and 2 ATP, 10 aborted shocks - in setting of hypokalemia from GI loses, initial K 2.8 (suspected etiology) - loaded w/ IV amiodarone, K corrected - VT has been quiescent since 11/25 w/ repletion of K.  - Amio stopped 11/28. No further VT  - Echo stable, EF 30-35%  - doubt ischemic component. Normal cath in 2015. No CP. + mild HS trop elevation but flat trend, most c/w demand ischemia  - brief NSVT this am, K 3.3, will repleat   2. Chronic Systolic Heart Failure  - NICM - Echo 07/2013 EF 20-25%. RV normal>> LHC normal cors. - Repeat Echo 5/15 EF 30-35% - s/p ICD 7/15 - Echo 8/22, EF 30%, RV normal - Echo this admit stable, EF 30-35%, IVC not dilated  - labs not c/w low output,  Lactic acid and LFTs nl  - SCr remains elevated in setting of GI losses but improving. - continue to hold diuretics and GDMT for now. Can use hydral if becomes hypertensive   - no SGLT2 w/ perineal infection    3. AKI on Stage IIIb CKD - b/l SCr ~2.0 - elevated this admit, ~3.0, 2.5 today - suspect due to dehydration from GI losses  - hold diuretics for now/ avoid hypotension   - follow BMP closely.      4. Right gluteal cellulitis and wound - CT scan with no evidence for abscess or drainable fluid collection - GS following, NPO for possible drainage today - c/w abx, on  Zosyn    5. Type DM - insulin per primary team   6. New Polyarticular Pain  - left ankle and rt knee - Uric acid 10 and plain films showed: Possible 8 mm intra-articular loose body. This could be confirmed with MRI examination if indicated. No fracture or dislocation. - Suspected gout, colchicine started   Length of Stay: [place], NP  05/08/2022, 7:09 AM  Advanced Heart Failure Team Pager [phone] (M-F; 7a - 5p)  Please contact [facility] for night-coverage after hours (5p -7a ) and weekends on [email or web address]

## 2022-05-08 NOTE — Hospital Course (Addendum)
56 year old man chronic systolic CHF, ICM, status post ICD presented to ED with history of diarrhea, ICD shock, in the emergency department had witnessed V. tach arrest, underwent CPR with ROSC with 1 shock with return to baseline neurologic status, admitted to ICU.  Stabilized.  Also developed buttocks abscess/cellulitis and seen by general surgery.  Transferred to hospitalist service 11/30.  12/5 had VT storm, left heart catheter showed no CAD.  Further management per electrophysiology.  Consultants Cardiology Advanced heart failure Electrophysiology  General surgery   Procedures 12/4 R/LHC IMPRESSION: Low pre and post capillary filling pressures.  Normal cardiac output/cardiac index.  Normal PVR & PA mean No obstructive CAD, left dominant w/ large Lcx supplying multiple large marginals.

## 2022-05-08 NOTE — Progress Notes (Signed)
Mobility Specialist Progress Note:   05/08/22 1053  Mobility  Activity Ambulated with assistance to bathroom  Level of Assistance Contact guard assist, steadying assist  Assistive Device Front wheel walker  Distance Ambulated (ft) 14 ft  Activity Response Tolerated well  Mobility Referral Yes  $Mobility charge 1 Mobility   Pt received EOB needing to use the bathroom. MinA to stand then contact guard to get to the bathroom. Complaints of pain because of gout. Left in bathroom and was instructed to pull string when finished.   Gareth Eagle Hong Timm Mobility Specialist Please contact via [employer] or  Rehab Office at [phone]

## 2022-05-08 NOTE — Progress Notes (Signed)
PROGRESS NOTE [name]  DPO:[id number] DOB: 08/17/1965 DOA: 05/03/2022 PCP: [name], MD   Brief Narrative/Hospital Course: 57 year old male with history of HFrEF and ICM status post ICD placement, type 2 diabetes, chronic low back pain with a spondylosis and spinal stenosis, obesity BMI 35, CKD 3 baseline creatinine 1.8-2.3, GERD brought to the ED for diarrhea after recently increasing his Trulicity on 43/15 having increasing diarrhea, repeated LE with ICD shock on 11/25 unable to take p.o. on 11/25 EMS called brought to the ED. In the ED had witnessed V. tach arrest underwent CPR with ROSC with 1 shock return to baseline neurological status lab with potassium 2.8 creatinine 2.85 baseline 1.8-2.3, leukocytosis 19.5 low-grade fever also hypotension, currently consulted admitted to ICU Hypokalemia stabilized with aggressive replacement.Found to have new buttock abscess/cellulitis and surgery consulted.Patient transferred to [facility] service 11/30.   Subjective: Seen this am Comparatively on bed Complains of pain in left foot and right knee has not improved Still c/o loose stool 4 times yesterday No abdomne pain chest pain fever chills  Assessment and Plan:  Hypokalemia: We will replete aggressively- 40 po kdur x1  and add 20 kdur bid.Since Colchicine being started we will need to monitor for diarrhea Recent Labs  Lab 05/04/22 0412 05/04/22 1610 05/04/22 1855 05/04/22 2210 05/05/22 0635 05/05/22 1533 05/06/22 1645 05/07/22 0535 05/08/22 0229  K 3.3*   < > 3.2* 3.1* 3.1* 4.5 3.9 3.8 3.3*  CALCIUM 8.7*  --  8.2* 8.2* 8.0* 8.6* 8.5* 7.9* 8.5*  MG 2.4  --  2.4  --  2.6*  --  2.6* 2.5* 2.5*  PHOS 2.1*  --   --  2.0* 2.6  --   --   --   --    < > = values in this interval not displayed.    Hypotension suspected hypovolemic shock due to diarrhea.  BP currently stable.  Chronic systolic CHF-EF 40-08% this admission similar to previous NICM w ICD in place: Cont his Aspirin  Lipitor. Cardio onboard.  GDMT as per cardiology.  Holding diuretics.  VLoralee Pacas  Storm due to hypokalemia causing cardiac arrest.  Continue to monitor on telemetry.  Diarrhea suspicious to Trulicity dose increase: upto 20 episodes of diarrhea per day, Trulicity discontinued diarrhea improving. C. Difficile and GI penl negative. Cont Imodium, Banatrol.  Type 2 diabetes mellitus with hyperglycemia: Trulicity discontinued due to side effects.  On Levemir, SSI.  Blood sugar is stable uncontrolled A1c 10.2 indicating poor outpatient control Recent Labs  Lab 05/04/22 0412 05/04/22 0433 05/07/22 1617 05/07/22 2102 05/08/22 0053 05/08/22 0509 05/08/22 0838  GLUCAP  --    < > 125* 196* 156* 112* 118*  HGBA1C 10.2*  --   --   --   --   --   --    < > = values in this interval not displayed.   Cellulitis of the buttock: On Zosyn planning for 7 to 10 days course, surgery following-with worsening WBC and they are obtaining ultrasound to evaluate for deeper collection.  CT scan 11/27 no evidence of abscess or drainable fluid collection. Leukocytosis WBC elevated 13-15 K.  Monitor on antibiotics. Recent Labs  Lab 05/04/22 0412 05/05/22 0635 05/06/22 1810 05/07/22 0535 05/08/22 0229  WBC 16.8* 18.0* 14.6* 13.9* 15.0*    Right knee pain Left foot- ankle/mid foot pain: Pain since Monday, cannot walk now- used walker yesterday. Uric acid elevated suspicious for gout knee x-ray does not correlate with location of possible intra-articular  loose body . pain is persistent - started colchicine 0.3mg  bid but not much better -- will get Ortho eval. Allopurinol after acute phase resolves.   AKI on CKD IIIb: Baseline creatinine close to 2.3 back in August.  Continue to monitor creatinine close to baseline Recent Labs    05/03/22 2010 05/03/22 2025 05/03/22 2147 05/04/22 0412 05/04/22 1855 05/04/22 2210 05/05/22 0635 05/05/22 1533 05/06/22 1645 05/07/22 0535 05/08/22 0229  BUN 32* 33*  --  35* 40*  40* 43* 45* 39* 37* 30*  CREATININE 2.84* 2.80* 2.61* 2.62* 3.04* 2.97* 3.06* 3.08* 2.86* 2.74* 2.51*    Hyponatremia: Improved Recent Labs  Lab 05/05/22 0635 05/05/22 1533 05/06/22 1645 05/07/22 0535 05/08/22 0229  NA 132* 136 135 132* 136     Class II Obesity:Patient's Body mass index is 35.73 kg/m. : Will benefit with PCP follow-up, weight loss  healthy lifestyle and outpatient sleep evaluation.  DVT prophylaxis: heparin injection 5,000 Units Start: 05/03/22 2200 SCDs Start: 05/03/22 2111 Code Status:   Code Status: Full Code Family Communication: plan of care discussed with patient at bedside. Patient status is: inpatient because of hypokalemia Level of care: Telemetry Cardiac   Dispo: The patient is from: home  with his brother            Anticipated disposition: TBD Objective: Vitals last 24 hrs: Vitals:   05/08/22 0000 05/08/22 0500 05/08/22 0504 05/08/22 0842  BP: 106/61  114/67 111/62  Pulse: 80  76 71  Resp: 18  20 16   Temp: 98.9 F (37.2 C)  98.7 F (37.1 C) 98.2 F (36.8 C)  TempSrc: Oral  Oral Oral  SpO2: 95%  94% 91%  Weight:  119.5 kg    Height:       Weight change: -0.2 kg  Physical Examination: General exam: alert awake, older than stated age HEENT:Oral mucosa moist, Ear/Nose WNL grossly Respiratory system: bilaterally clear BS, no use of accessory muscle Cardiovascular system: S1 & S2 +, No JVD. Gastrointestinal system: Abdomen soft,NT,ND, BS+ Nervous System:Alert, awake, moving extremities. Extremities: LE left ankle edematous tender midfott and ankle and tender rt knee medially Skin: No rashes,no icterus. MSK: Normal muscle bulk,tone, power  Medications reviewed:  Scheduled Meds:  aspirin EC  81 mg Oral Daily   atorvastatin  10 mg Oral Daily   Chlorhexidine Gluconate Cloth  6 each Topical Daily   colchicine  0.3 mg Oral BID   fiber supplement (BANATROL TF)  60 mL Oral BID   heparin  5,000 Units Subcutaneous Q8H   insulin aspart  0-15  Units Subcutaneous Q4H   insulin detemir  10 Units Subcutaneous BID   potassium chloride  20 mEq Oral BID   potassium chloride  40 mEq Oral Once   sertraline  12.5 mg Oral Daily   tamsulosin  0.4 mg Oral Daily   Continuous Infusions:  piperacillin-tazobactam (ZOSYN)  IV 3.375 g (05/08/22 5885)    Diet Order             Diet heart healthy/carb modified Room service appropriate? Yes; Fluid consistency: Thin  Diet effective now                  Intake/Output Summary (Last 24 hours) at 05/08/2022 0922 Last data filed at 05/08/2022 0843 Gross per 24 hour  Intake 1118.17 ml  Output --  Net 1118.17 ml   Net IO Since Admission: 3,353.5 mL [05/08/22 0922]  Wt Readings from Last 3 Encounters:  05/08/22 119.5 kg  04/04/22 118 kg  03/21/22 117.5 kg     Unresulted Labs (From admission, onward)     Start     Ordered   05/06/22 2202  Basic metabolic panel  Daily at 5am,   R     Question:  Specimen collection method  Answer:  Lab=Lab collect   05/05/22 0757   05/06/22 0500  Magnesium  Daily at 5am,   R     Question:  Specimen collection method  Answer:  Lab=Lab collect   05/05/22 0757          Data Reviewed: I have personally reviewed following labs and imaging studies CBC: Recent Labs  Lab 05/03/22 2010 05/03/22 2025 05/04/22 0412 05/05/22 0635 05/06/22 1810 05/07/22 0535 05/08/22 0229  WBC 19.5*   < > 16.8* 18.0* 14.6* 13.9* 15.0*  NEUTROABS 15.8*  --   --  15.3*  --   --   --   HGB 14.1   < > 11.6* 12.4* 12.5* 11.3* 11.0*  HCT 41.9   < > 35.5* 35.9* 36.3* 33.8* 31.9*  MCV 91.1   < > 93.9 88.4 89.0 90.4 89.1  PLT 271   < > 199 243 247 256 286   < > = values in this interval not displayed.   Basic Metabolic Panel: Recent Labs  Lab 05/04/22 0412 05/04/22 1610 05/04/22 1855 05/04/22 2210 05/05/22 0635 05/05/22 1533 05/06/22 1645 05/07/22 0535 05/08/22 0229  NA 137  --  131* 132* 132* 136 135 132* 136  K 3.3*   < > 3.2* 3.1* 3.1* 4.5 3.9 3.8 3.3*  CL  102  --  95* 97* 98 101 101 103 104  CO2 20*  --  21* 20* 21* 20* 21* 19* 22  GLUCOSE 267*  --  313* 229* 123* 131* 163* 142* 134*  BUN 35*  --  40* 40* 43* 45* 39* 37* 30*  CREATININE 2.62*  --  3.04* 2.97* 3.06* 3.08* 2.86* 2.74* 2.51*  CALCIUM 8.7*  --  8.2* 8.2* 8.0* 8.6* 8.5* 7.9* 8.5*  MG 2.4  --  2.4  --  2.6*  --  2.6* 2.5* 2.5*  PHOS 2.1*  --   --  2.0* 2.6  --   --   --   --    < > = values in this interval not displayed.   GFR: Estimated Creatinine Clearance: 43.4 mL/min (A) (by C-G formula based on SCr of 2.51 mg/dL (H)). Liver Function Tests: Recent Labs  Lab 05/03/22 2010 05/05/22 0635 05/06/22 1645  AST 15 18 40  ALT 12 13 30   ALKPHOS 58 51 62  BILITOT 1.2 0.8 1.0  PROT 7.6 6.7 6.7  ALBUMIN 3.4* 2.6* 2.5*   Recent Labs    01/09/22 1002 03/21/22 1452  PROBNP 97 18.0   Recent Results (from the past 240 hour(s))  MRSA Next Gen by PCR, Nasal     Status: None   Collection Time: 05/03/22 10:48 PM   Specimen: Nasal Mucosa; Nasal Swab  Result Value Ref Range Status   MRSA by PCR Next Gen NOT DETECTED NOT DETECTED Final    Comment: (NOTE) The GeneXpert MRSA Assay (FDA approved for NASAL specimens only), is one component of a comprehensive MRSA colonization surveillance program. It is not intended to diagnose MRSA infection nor to guide or monitor treatment for MRSA infections. Test performance is not FDA approved in patients less than 35 years old. Performed at [facility], [place],  [place] [address]   C Difficile Quick Screen w PCR reflex     Status: None   Collection Time: 05/05/22  8:35 PM   Specimen: Stool  Result Value Ref Range Status   C Diff antigen NEGATIVE NEGATIVE Final   C Diff toxin NEGATIVE NEGATIVE Final   C Diff interpretation No C. difficile detected.  Final    Comment: Performed at [facility], [place] [address], [place], [place] [address]  Gastrointestinal Panel by PCR , Stool     Status: None   Collection  Time: 05/06/22  9:53 PM   Specimen: Stool  Result Value Ref Range Status   Campylobacter species NOT DETECTED NOT DETECTED Final   Plesimonas shigelloides NOT DETECTED NOT DETECTED Final   Salmonella species NOT DETECTED NOT DETECTED Final   Yersinia enterocolitica NOT DETECTED NOT DETECTED Final   Vibrio species NOT DETECTED NOT DETECTED Final   Vibrio cholerae NOT DETECTED NOT DETECTED Final   Enteroaggregative E coli (EAEC) NOT DETECTED NOT DETECTED Final   Enteropathogenic E coli (EPEC) NOT DETECTED NOT DETECTED Final   Enterotoxigenic E coli (ETEC) NOT DETECTED NOT DETECTED Final   Shiga like toxin producing E coli (STEC) NOT DETECTED NOT DETECTED Final   Shigella/Enteroinvasive E coli (EIEC) NOT DETECTED NOT DETECTED Final   Cryptosporidium NOT DETECTED NOT DETECTED Final   Cyclospora cayetanensis NOT DETECTED NOT DETECTED Final   Entamoeba histolytica NOT DETECTED NOT DETECTED Final   Giardia lamblia NOT DETECTED NOT DETECTED Final   Adenovirus F40/41 NOT DETECTED NOT DETECTED Final   Astrovirus NOT DETECTED NOT DETECTED Final   Norovirus GI/GII NOT DETECTED NOT DETECTED Final   Rotavirus A NOT DETECTED NOT DETECTED Final   Sapovirus (I, II, IV, and V) NOT DETECTED NOT DETECTED Final    Comment: Performed at [facility], [address] [place], [place] [address]    Antimicrobials: Anti-infectives (From admission, onward)    Start     Dose/Rate Route Frequency Ordered Stop   05/05/22 1400  piperacillin-tazobactam (ZOSYN) IVPB 3.375 g        3.375 g 12.5 mL/hr over 240 Minutes Intravenous Every 8 hours 05/05/22 1158     05/05/22 1030  amoxicillin-clavulanate (AUGMENTIN) 875-125 MG per tablet 1 tablet  Status:  Discontinued        1 tablet Oral Every 12 hours 05/05/22 0936 05/05/22 1158      Culture/Microbiology    Component Value Date/Time   SDES  09/18/2017 0040    BLOOD LEFT HAND Performed at [facility], [address], [place], [place]  [address]    [facility]  09/18/2017 0040    BOTTLES DRAWN AEROBIC AND ANAEROBIC Blood Culture adequate volume Performed at [facility], [address] [place], [place] [address]    CULT  09/18/2017 0040    NO GROWTH 5 DAYS Performed at [facility], [address] [place],  [address]    REPTSTATUS 09/23/2017 FINAL 09/18/2017 0040  Radiology Studies: [place] RT LOWER EXTREM LTD SOFT TISSUE NON VASCULAR  Result Date: 05/08/2022 CLINICAL DATA:  Right gluteal cellulitis EXAM: ULTRASOUND RIGHT LOWER EXTREMITY LIMITED TECHNIQUE: Ultrasound examination of the lower extremity soft tissues was performed in the area of clinical concern. COMPARISON:  None Available. FINDINGS: In the region of concern along the subcutaneous tissues in the right buttock region, there is infiltrative subcutaneous edema. No abscess or drainable process observed. IMPRESSION: 1. In the region of concern along the  right buttock region, there is infiltrative subcutaneous edema. No abscess or drainable process observed. Electronically Signed   By: Van Clines M.D.   On: 05/08/2022 09:08   DG Knee 1-2 Views Right  Result Date: 05/07/2022 CLINICAL DATA:  Right knee pain EXAM: RIGHT KNEE - 1-2 VIEW COMPARISON:  None Available. FINDINGS: Normal alignment. No acute fracture or dislocation. Corticated 8 mm ossific density seen within the posterior joint space on lateral examination may represent an intra-articular loose body. No effusion. Soft tissues are unremarkable. IMPRESSION: 1. No acute fracture or dislocation. 2. Possible 8 mm intra-articular loose body. This could be confirmed with MRI examination if indicated. Electronically Signed   By: Fidela Salisbury M.D.   On: 05/07/2022 14:24   ECHOCARDIOGRAM COMPLETE  Result Date: 05/06/2022    ECHOCARDIOGRAM REPORT   Patient Name:   PAWEL SOULES Date of Exam: 05/06/2022 Medical Rec #:  [id number]      Height:       72.0 in Accession #:    [id number]      Weight:       262.1 lb Date of Birth:  1965/07/24      BSA:          2.390 m Patient Age:    66 years       BP:           102/75 mmHg Patient Gender: M              HR:           89 bpm. Exam Location:  Inpatient Procedure: 2D Echo, Color Doppler and Cardiac Doppler Indications:    ventricular tachycardia  History:        Patient has prior history of Echocardiogram examinations, most                 recent 02/01/2021. Cardiomyopathy, Defibrillator, chronic kidney                 disease; Risk Factors:Former Smoker and Diabetes.  Sonographer:    Johny Chess RDCS Referring Phys: [id number] Shirley Friar  Sonographer Comments: Patient is obese. Image acquisition challenging due to patient body habitus and Image acquisition challenging due to respiratory motion. IMPRESSIONS  1. Left ventricular ejection fraction, by estimation, is 30 to 35%. The left ventricle has moderately decreased function. The left ventricle demonstrates global hypokinesis. The left ventricular internal cavity size was moderately dilated. There is mild  eccentric left ventricular hypertrophy. Left ventricular diastolic parameters are consistent with Grade I diastolic dysfunction (impaired relaxation).  2. Right ventricular systolic function is normal. The right ventricular size is normal. Tricuspid regurgitation signal is inadequate for assessing PA pressure.  3. Left atrial size was moderately dilated.  4. The mitral valve is normal in structure. No evidence of mitral valve regurgitation.  5. The aortic valve is tricuspid. Aortic valve regurgitation is not visualized. No aortic stenosis is present.  6. Aortic dilatation noted. There is borderline dilatation of the aortic root, measuring 38 mm.  7. The inferior vena cava is normal in size with greater than 50% respiratory variability, suggesting right atrial pressure of 3 mmHg. Comparison(s): No significant change from prior study. Prior images reviewed side by side. FINDINGS  Left  Ventricle: Left ventricular ejection fraction, by estimation, is 30 to 35%. The left ventricle has moderately decreased function. The left ventricle demonstrates global hypokinesis. The left ventricular internal cavity size was moderately dilated. There is mild eccentric left ventricular hypertrophy. Left  ventricular diastolic parameters are consistent with Grade I diastolic dysfunction (impaired relaxation). Normal left ventricular filling pressure. Right Ventricle: The right ventricular size is normal. No increase in right ventricular wall thickness. Right ventricular systolic function is normal. Tricuspid regurgitation signal is inadequate for assessing PA pressure. Left Atrium: Left atrial size was moderately dilated. Right Atrium: Right atrial size was normal in size. Pericardium: There is no evidence of pericardial effusion. Mitral Valve: The mitral valve is normal in structure. No evidence of mitral valve regurgitation. Tricuspid Valve: The tricuspid valve is normal in structure. Tricuspid valve regurgitation is not demonstrated. Aortic Valve: The aortic valve is tricuspid. Aortic valve regurgitation is not visualized. No aortic stenosis is present. Pulmonic Valve: The pulmonic valve was grossly normal. Pulmonic valve regurgitation is trivial. Aorta: Aortic dilatation noted. There is borderline dilatation of the aortic root, measuring 38 mm. Venous: The inferior vena cava is normal in size with greater than 50% respiratory variability, suggesting right atrial pressure of 3 mmHg. IAS/Shunts: No atrial level shunt detected by color flow Doppler. Additional Comments: A device lead is visualized in the right ventricle.  LEFT VENTRICLE PLAX 2D LVIDd:         6.40 cm      Diastology LVIDs:         3.90 cm      LV e' medial:    12.50 cm/s LV PW:         1.20 cm      LV E/e' medial:  4.6 LV IVS:        1.20 cm      LV e' lateral:   7.40 cm/s LVOT diam:     2.50 cm      LV E/e' lateral: 7.8 LV SV:         64 LV SV  Index:   27 LVOT Area:     4.91 cm  LV Volumes (MOD) LV vol d, MOD A4C: 168.0 ml LV vol s, MOD A4C: 99.8 ml LV SV MOD A4C:     168.0 ml RIGHT VENTRICLE             IVC RV Basal diam:  2.80 cm     IVC diam: 2.10 cm RV S prime:     20.80 cm/s TAPSE (M-mode): 1.8 cm LEFT ATRIUM             Index        RIGHT ATRIUM           Index LA diam:        4.60 cm 1.92 cm/m   RA Area:     18.90 cm LA Vol (A2C):   64.8 ml 27.11 ml/m  RA Volume:   46.90 ml  19.62 ml/m LA Vol (A4C):   78.7 ml 32.93 ml/m LA Biplane Vol: 75.9 ml 31.76 ml/m  AORTIC VALVE LVOT Vmax:   80.70 cm/s LVOT Vmean:  54.000 cm/s LVOT VTI:    0.130 m  AORTA Ao Root diam: 3.80 cm Ao Asc diam:  3.30 cm MITRAL VALVE MV Area (PHT): 5.02 cm    SHUNTS MV Decel Time: 151 msec    Systemic VTI:  0.13 m MV E velocity: 57.60 cm/s  Systemic Diam: 2.50 cm MV A velocity: 80.70 cm/s MV E/A ratio:  0.71 Mihai Croitoru MD Electronically signed by Sanda Klein MD Signature Date/Time: 05/06/2022/4:28:46 PM    Final      LOS: 5 days   Antonieta Pert, MD [facility]  05/08/2022,  9:22 AM

## 2022-05-09 ENCOUNTER — Encounter: Payer: Self-pay | Admitting: Internal Medicine

## 2022-05-09 ENCOUNTER — Other Ambulatory Visit (HOSPITAL_COMMUNITY): Payer: Self-pay

## 2022-05-09 DIAGNOSIS — I469 Cardiac arrest, cause unspecified: Secondary | ICD-10-CM | POA: Diagnosis not present

## 2022-05-09 DIAGNOSIS — L0231 Cutaneous abscess of buttock: Secondary | ICD-10-CM | POA: Diagnosis not present

## 2022-05-09 DIAGNOSIS — I472 Ventricular tachycardia, unspecified: Secondary | ICD-10-CM | POA: Diagnosis not present

## 2022-05-09 DIAGNOSIS — L03317 Cellulitis of buttock: Secondary | ICD-10-CM | POA: Diagnosis not present

## 2022-05-09 LAB — GLUCOSE, CAPILLARY
Glucose-Capillary: 122 mg/dL — ABNORMAL HIGH (ref 70–99)
Glucose-Capillary: 143 mg/dL — ABNORMAL HIGH (ref 70–99)
Glucose-Capillary: 209 mg/dL — ABNORMAL HIGH (ref 70–99)
Glucose-Capillary: 247 mg/dL — ABNORMAL HIGH (ref 70–99)
Glucose-Capillary: 294 mg/dL — ABNORMAL HIGH (ref 70–99)
Glucose-Capillary: 97 mg/dL (ref 70–99)

## 2022-05-09 LAB — CBC
HCT: 32.5 % — ABNORMAL LOW (ref 39.0–52.0)
Hemoglobin: 10.8 g/dL — ABNORMAL LOW (ref 13.0–17.0)
MCH: 30.3 pg (ref 26.0–34.0)
MCHC: 33.2 g/dL (ref 30.0–36.0)
MCV: 91 fL (ref 80.0–100.0)
Platelets: 334 10*3/uL (ref 150–400)
RBC: 3.57 MIL/uL — ABNORMAL LOW (ref 4.22–5.81)
RDW: 15.5 % (ref 11.5–15.5)
WBC: 16.5 10*3/uL — ABNORMAL HIGH (ref 4.0–10.5)
nRBC: 0 % (ref 0.0–0.2)

## 2022-05-09 LAB — BASIC METABOLIC PANEL
Anion gap: 12 (ref 5–15)
BUN: 25 mg/dL — ABNORMAL HIGH (ref 6–20)
CO2: 21 mmol/L — ABNORMAL LOW (ref 22–32)
Calcium: 8.4 mg/dL — ABNORMAL LOW (ref 8.9–10.3)
Chloride: 102 mmol/L (ref 98–111)
Creatinine, Ser: 2.02 mg/dL — ABNORMAL HIGH (ref 0.61–1.24)
GFR, Estimated: 38 mL/min — ABNORMAL LOW (ref >60)
Glucose, Bld: 173 mg/dL — ABNORMAL HIGH (ref 70–99)
Potassium: 3.5 mmol/L (ref 3.5–5.1)
Sodium: 135 mmol/L (ref 135–145)

## 2022-05-09 LAB — MAGNESIUM: Magnesium: 2.2 mg/dL (ref 1.7–2.4)

## 2022-05-09 MED ORDER — POTASSIUM CHLORIDE CRYS ER 20 MEQ PO TBCR
20.0000 meq | EXTENDED_RELEASE_TABLET | Freq: Two times a day (BID) | ORAL | 5 refills | Status: DC
  Filled 2022-05-09: qty 60, 30d supply, fill #0

## 2022-05-09 MED ORDER — SPIRONOLACTONE 12.5 MG HALF TABLET
12.5000 mg | ORAL_TABLET | Freq: Every day | ORAL | Status: DC
  Administered 2022-05-09 – 2022-05-11 (×3): 12.5 mg via ORAL
  Filled 2022-05-09 (×3): qty 1

## 2022-05-09 MED ORDER — PREDNISONE 20 MG PO TABS
40.0000 mg | ORAL_TABLET | Freq: Every day | ORAL | Status: DC
  Administered 2022-05-09 – 2022-05-10 (×2): 40 mg via ORAL
  Filled 2022-05-09 (×2): qty 2

## 2022-05-09 MED ORDER — DAPAGLIFLOZIN PROPANEDIOL 10 MG PO TABS
10.0000 mg | ORAL_TABLET | Freq: Every day | ORAL | Status: DC
  Administered 2022-05-09: 10 mg via ORAL
  Filled 2022-05-09: qty 1

## 2022-05-09 MED ORDER — SPIRONOLACTONE 25 MG PO TABS
25.0000 mg | ORAL_TABLET | Freq: Every day | ORAL | 5 refills | Status: DC
  Filled 2022-05-09: qty 30, 30d supply, fill #0

## 2022-05-09 MED ORDER — LOSARTAN POTASSIUM 25 MG PO TABS
12.5000 mg | ORAL_TABLET | Freq: Every day | ORAL | Status: DC
  Administered 2022-05-10: 12.5 mg via ORAL
  Filled 2022-05-09: qty 1

## 2022-05-09 MED ORDER — INSULIN ASPART 100 UNIT/ML IJ SOLN
0.0000 [IU] | Freq: Three times a day (TID) | INTRAMUSCULAR | Status: DC
  Administered 2022-05-10: 2 [IU] via SUBCUTANEOUS

## 2022-05-09 MED ORDER — POTASSIUM CHLORIDE CRYS ER 20 MEQ PO TBCR: 40.0000 meq | EXTENDED_RELEASE_TABLET | Freq: Once | ORAL | Status: DC

## 2022-05-09 NOTE — TOC Initial Note (Signed)
Transition of Care [facility]) - Initial/Assessment Note    Patient Details  Name: [name] MRN: [id number] Date of Birth: Nov 28, 1965  Transition of Care [facility]) CM/SW Contact:    Erenest Rasher, RN Phone Number: [phone] 05/09/2022, 4:55 PM  Clinical Narrative:                 HF TOC CM spoke to pt and states he lives with brother. He has scale at home to do daily weights. States his electric bill is $1800, referral sent to HF CSW to follow up. Pt reports has scale. Educated on importance of daily weights. State she drives to appts. Will continue to follow for dc needs.   Expected Discharge Plan: [place] Barriers to Discharge: Continued Medical Work up   Patient Goals and CMS Choice Patient states their goals for this hospitalization and ongoing recovery are:: wants to be able to live independently CMS Medicare.gov Compare Post Acute Care list provided to:: Patient    Expected Discharge Plan and Services Expected Discharge Plan: [place] In-house Referral: Clinical Social Work Discharge Planning Services: CM Consult   Living arrangements for the past 2 months: [place]                                      Prior Living Arrangements/Services Living arrangements for the past 2 months: Single Family Home Lives with:: Self Patient language and need for interpreter reviewed:: Yes        Need for Family Participation in Patient Care: No (Comment) Care giver support system in place?: Yes (comment)   Criminal Activity/Legal Involvement Pertinent to Current Situation/Hospitalization: No - Comment as needed  Activities of Daily Living Home Assistive Devices/Equipment: Other (Comment) (multiple items as he and brother are caregiver for mother) ADL Screening (condition at time of admission) Patient's cognitive ability adequate to safely complete daily activities?: Yes Is the patient deaf or have difficulty hearing?:  No Does the patient have difficulty seeing, even when wearing glasses/contacts?: No Does the patient have difficulty concentrating, remembering, or making decisions?: No Patient able to express need for assistance with ADLs?: Yes Does the patient have difficulty dressing or bathing?: No Independently performs ADLs?: Yes (appropriate for developmental age) Does the patient have difficulty walking or climbing stairs?: Yes Weakness of Legs: Both Weakness of Arms/Hands: None  Permission Sought/Granted Permission sought to share information with : Case Manager, PCP, Family Supports Permission granted to share information with : Yes, Verbal Permission Granted  Share Information with NAME: [name]     Permission granted to share info w Relationship: brother  Permission granted to share info w Contact Information: [phone]  Emotional Assessment   Attitude/Demeanor/Rapport: Engaged Affect (typically observed): Accepting Orientation: : Oriented to Self, Oriented to Place, Oriented to  Time, Oriented to Situation   Psych Involvement: No (comment)  Admission diagnosis:  Cardiac arrest ([place]) [I46.9] V-tach [facility]) [I47.20] Patient Active Problem List   Diagnosis Date Noted   Acute gout due to renal impairment involving left ankle 05/07/2022   Acute gout due to renal impairment involving right knee 05/07/2022   Hyponatremia 05/07/2022   Diarrhea 05/06/2022   Hyperglycemia 05/06/2022   Cellulitis of buttock 05/06/2022   Chronic HFrEF (heart failure with reduced ejection fraction) ([place]) 05/06/2022   Acute renal failure superimposed on stage 3b chronic kidney disease ([place]) 05/06/2022  Hypokalemia 05/05/2022   Rectal abscess 05/05/2022   Left buttock abscess 05/05/2022   Cardiac arrest ([place]) 05/03/2022   AKI (acute kidney injury) ([place]) 05/03/2022   V-tach ([place]) 05/03/2022   CKD (chronic kidney disease) stage 2, GFR 60-89 ml/min 10/24/2019   Acute non-recurrent frontal sinusitis  06/27/2019   COVID-19 virus infection 06/27/2019   Glaucoma suspect 12/22/2018   Seasonal and perennial allergic rhinoconjunctivitis 10/25/2018   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   Urticaria 05/03/2018   S/P inguinal hernia repair 03/01/2018   Unilateral inguinal hernia without obstruction or gangrene 07/24/2017   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   Hypersomnia 02/28/2016   Abnormal PFT 02/24/2016   Shortness of breath 01/28/2016   Diabetic polyneuropathy associated with type 2 diabetes mellitus ([place]) 12/18/2015   Lumbar back pain 12/18/2015   Controlled type 2 diabetes mellitus with complication, with long-term current use of insulin ([place]) 09/17/2015   Left median nerve neuropathy 04/16/2015   Lesion of right median nerve at forearm 04/16/2015   ICD (implantable cardioverter-defibrillator) in place 50/53/9767   Chronic systolic congestive heart failure ([place]) 03/28/2014   Nonischemic cardiomyopathy ([place]) 11/09/2013   Syncope 10/20/2013   Chronic chest pain 07/28/2013   RENAL CALCULUS 05/13/2009   Obesity 05/08/2009   Former heavy cigarette smoker (20-39 per day) 05/08/2009   HOARSENESS 05/08/2009   ANXIETY 02/28/2008   ASTHMATIC BRONCHITIS, ACUTE 02/28/2008   GERD 02/28/2008   IRRITABLE BOWEL SYNDROME 02/28/2008   PCP:  [name], MD Pharmacy:   [facility], [address] [place] [address] Phone: [phone] Fax: [phone]  [facility] [place], [place] [address] [place] [address] Phone: [phone] Fax: [phone]  [id number] [place], [place] - [address] [place] [address] [place] [address] Phone: [phone] Fax: [phone]  [facility] [address] [address] Phone:  [phone] Fax: [phone]     Social Determinants of Health (SDOH) Interventions Utilities Interventions: Inpatient TOC Financial Strain Interventions: Inpatient TOC  Readmission Risk Interventions     No data to display

## 2022-05-09 NOTE — Progress Notes (Signed)
No signs of abscess or indication for surgery at this time given exam more consistent with cellulitis and recent CT and u/s negative for underlying abscess. Continue antibiotics. If exam becomes more concerning for abscess development could repeat u/s and call us back. We will sign off, please call with questions or concerns.  Wellington Hampshire, [facility] 05/09/2022, 10:12 AM Please see Amion for pager number during day hours 7:00am-4:30pm
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
## 2022-05-09 NOTE — Consult Note (Addendum)
Advanced Heart Failure Rounding Note  PCP-Cardiologist: [name], MD   Subjective:    No further VT.  Labs not c/w low output. Lactic acid and LFTs ok. SCr starting to improve 3.08>>2.86   Echo 11/28 EF 30-35%, RV normal. The inferior vena cava is normal in size with greater than 50% respiratory variability, suggesting right atrial pressure of 3 mmHg. BNP 487.   Remains on Zosyn for buttock wound. GS following. [place] yesterday showed: there is infiltrative subcutaneous edema. No abscess or drainable process observed.  Continues with polyarticular pain, left ankle and rt knee. Started on colchicine.   Feels ok, limited mobility 2/2 gout pain.    Objective:   Weight Range: 118.7 kg Body mass index is 35.49 kg/m.   Vital Signs:   Temp:  [97.7 F (36.5 C)-99.4 F (37.4 C)] 98.7 F (37.1 C) (12/01 0801) Pulse Rate:  [68-80] 68 (12/01 0801) Resp:  [16-21] 16 (12/01 0801) BP: (94-121)/(55-71) 94/65 (12/01 0801) SpO2:  [90 %-97 %] 95 % (12/01 0801) Weight:  [118.7 kg] 118.7 kg (12/01 0500) Last BM Date : 05/08/22  Weight change: Filed Weights   05/07/22 0500 05/08/22 0500 05/09/22 0500  Weight: 119.7 kg 119.5 kg 118.7 kg    Intake/Output:   Intake/Output Summary (Last 24 hours) at 05/09/2022 0815 Last data filed at 05/09/2022 0017 Gross per 24 hour  Intake 980 ml  Output 550 ml  Net 430 ml       Physical Exam    General:  well appearing.  No respiratory difficulty HEENT: normal Neck: supple. JVD difficult to access, thick neck. Carotids 2+ bilat; no bruits. No lymphadenopathy or thyromegaly appreciated. Cor: PMI nondisplaced. Regular rate & rhythm. No rubs, gallops or murmurs. Lungs: clear Abdomen: soft, nontender, nondistended. No hepatosplenomegaly. No bruits or masses. Good bowel sounds. Extremities: no cyanosis, clubbing, rash, edema. Bilateral feet/ankles very sensitive to touch Neuro: alert & oriented x 3, cranial nerves grossly intact. moves all 4  extremities, limited 2/2 pain. Affect pleasant.   Telemetry   NSR 60s (Personally reviewed)    EKG    No new EKG to review   Labs    CBC Recent Labs    05/08/22 0229 05/09/22 0149  WBC 15.0* 16.5*  HGB 11.0* 10.8*  HCT 31.9* 32.5*  MCV 89.1 91.0  PLT 286 332    Basic Metabolic Panel Recent Labs    05/08/22 0229 05/09/22 0149  NA 136 135  K 3.3* 3.5  CL 104 102  CO2 22 21*  GLUCOSE 134* 173*  BUN 30* 25*  CREATININE 2.51* 2.02*  CALCIUM 8.5* 8.4*  MG 2.5* 2.2    Liver Function Tests Recent Labs    05/06/22 1645  AST 40  ALT 30  ALKPHOS 62  BILITOT 1.0  PROT 6.7  ALBUMIN 2.5*    No results for input(s): "LIPASE", "AMYLASE" in the last 72 hours. Cardiac Enzymes No results for input(s): "CKTOTAL", "CKMB", "CKMBINDEX", "TROPONINI" in the last 72 hours.  BNP: BNP (last 3 results) Recent Labs    02/01/22 1702 05/03/22 2010 05/06/22 1736  BNP 11.8 350.8* 487.7*     ProBNP (last 3 results) Recent Labs    01/09/22 1002 03/21/22 1452  PROBNP 97 18.0      D-Dimer No results for input(s): "DDIMER" in the last 72 hours. Hemoglobin A1C No results for input(s): "HGBA1C" in the last 72 hours. Fasting Lipid Panel No results for input(s): "CHOL", "HDL", "LDLCALC", "TRIG", "CHOLHDL", "LDLDIRECT" in the  last 72 hours. Thyroid Function Tests Recent Labs    05/06/22 1736  TSH 1.156     Other results:   Imaging    [place] RT LOWER EXTREM LTD SOFT TISSUE NON VASCULAR  Result Date: 05/08/2022 CLINICAL DATA:  Right gluteal cellulitis EXAM: ULTRASOUND RIGHT LOWER EXTREMITY LIMITED TECHNIQUE: Ultrasound examination of the lower extremity soft tissues was performed in the area of clinical concern. COMPARISON:  None Available. FINDINGS: In the region of concern along the subcutaneous tissues in the right buttock region, there is infiltrative subcutaneous edema. No abscess or drainable process observed. IMPRESSION: 1. In the region of concern along the  right buttock region, there is infiltrative subcutaneous edema. No abscess or drainable process observed. Electronically Signed   By: Van Clines M.D.   On: 05/08/2022 09:08     Medications:     Scheduled Medications:  aspirin EC  81 mg Oral Daily   atorvastatin  10 mg Oral Daily   Chlorhexidine Gluconate Cloth  6 each Topical Daily   colchicine  0.3 mg Oral BID   fiber supplement (BANATROL TF)  60 mL Oral BID   heparin  5,000 Units Subcutaneous Q8H   insulin aspart  0-15 Units Subcutaneous Q4H   insulin detemir  10 Units Subcutaneous BID   potassium chloride  20 mEq Oral BID   sertraline  12.5 mg Oral Daily   tamsulosin  0.4 mg Oral Daily    Infusions:  piperacillin-tazobactam (ZOSYN)  IV 3.375 g (05/09/22 0618)    PRN Medications: acetaminophen, Gerhardt's butt cream, HYDROmorphone, levalbuterol, [COMPLETED] loperamide **FOLLOWED BY** loperamide, mouth rinse, oxyCODONE    Patient Profile   56 y/o AAM w/ h/o chronic systolic heart failure due to NICM, s/p ICD, admitted w/ VT storm treated w/ multiple ICD shocks w/ witnessed VT arrest while in ED treated w/ CPR and defib, all in setting of metabolic derangement/hypokalemia from GI loses. AHF team consulted for assistance w/ med management in setting of worsening renal function and soft BP, concerning for potential low output HF   Assessment/Plan   1. VT Storm - had 22 ICD shocks and 2 ATP, 10 aborted shocks - in setting of hypokalemia from GI loses, initial K 2.8 (suspected etiology) - loaded w/ IV amiodarone, K corrected - VT has been quiescent since 11/25 w/ repletion of K.  - Amio stopped 11/28. No further VT  - Echo stable, EF 30-35%  - doubt ischemic component. Normal cath in 2015. No CP. + mild HS trop elevation but flat trend, most c/w demand ischemia    2. Chronic Systolic Heart Failure  - NICM - Echo 07/2013 EF 20-25%. RV normal>> LHC normal cors. - Repeat Echo 5/15 EF 30-35% - s/p ICD 7/15 - Echo 8/22,  EF 30%, RV normal - Echo this admit stable, EF 30-35%, IVC not dilated  - labs not c/w low output, Lactic acid and LFTs nl  - Around baseline Cr. Will start low dose spiro, no SGLT2i with perineal infection - no SGLT2 w/ perineal infection    3. AKI on Stage IIIb CKD - b/l SCr ~2.0 - elevated this admit, ~3.0, 2.02 today - suspect due to dehydration from GI losses  - follow BMP closely.      4. Right gluteal cellulitis and wound - CT scan with no evidence for abscess or drainable fluid collection - GS following, US done  - c/w abx, on Zosyn    5. Type DM - insulin per primary team  6. New Polyarticular Pain  - left ankle and rt knee - Uric acid 10 and plain films showed: Possible 8 mm intra-articular loose body. This could be confirmed with MRI examination if indicated. No fracture or dislocation. - Suspected gout, colchicine started - Will add prednisone 40mg  daily for 4 days   Length of Stay: [place], NP  05/09/2022, 8:15 AM  Advanced Heart Failure Team Pager [phone] (M-F; 7a - 5p)  Please contact [facility] for night-coverage after hours (5p -7a ) and weekends on [email or web address]

## 2022-05-09 NOTE — TOC Transition Note (Signed)
Discharge medications (2) are being stored in the main pharmacy on the ground floor until patient is ready for discharge.   

## 2022-05-09 NOTE — Progress Notes (Signed)
PROGRESS NOTE [name]  XFG:[id number] DOB: 1965/06/21 DOA: 05/03/2022 PCP: [name], MD   Brief Narrative/Hospital Course: 56 year old male with history of HFrEF and ICM status post ICD placement, type 2 diabetes, chronic low back pain with a spondylosis and spinal stenosis, obesity BMI 35, CKD 3 baseline creatinine 1.8-2.3, GERD brought to the ED for diarrhea after recently increasing his Trulicity on 96/78 having increasing diarrhea, repeated LE with ICD shock on 11/25 unable to take p.o. on 11/25 EMS called brought to the ED. In the ED had witnessed V. tach arrest underwent CPR with ROSC with 1 shock return to baseline neurological status lab with potassium 2.8 creatinine 2.85 baseline 1.8-2.3, leukocytosis 19.5 low-grade fever also hypotension, currently consulted admitted to ICU Hypokalemia stabilized with aggressive replacement.Found to have new buttock abscess/cellulitis and surgery consulted.Patient transferred to [facility] service 11/30.   Subjective: Seen and examined He is more pain on left foot, rt foot with swelling and left hand Right knee is ok Had only 2 BM yesterday-improving Overnight patient is afebrile Tmax 99.4, BP stable Labs this morning show potassium still 3.5 creatinine improving 2.0 WBC count 15>16.5  Assessment and Plan:  Hypokalemia: Replete aggressively K-Dur 40x1, continue 20 KCl  po BID to keep goal of more than 4, mag is ok.Since Colchicine being started we will need to monitor for diarrhea Recent Labs  Lab 05/04/22 0412 05/04/22 1610 05/04/22 2210 05/05/22 0635 05/05/22 1533 05/06/22 1645 05/07/22 0535 05/08/22 0229 05/09/22 0149  K 3.3*   < > 3.1* 3.1* 4.5 3.9 3.8 3.3* 3.5  CALCIUM 8.7*   < > 8.2* 8.0* 8.6* 8.5* 7.9* 8.5* 8.4*  MG 2.4   < >  --  2.6*  --  2.6* 2.5* 2.5* 2.2  PHOS 2.1*  --  2.0* 2.6  --   --   --   --   --    < > = values in this interval not displayed.   Hypotension suspected hypovolemic shock due to diarrhea.  BP is  stable. Chronic systolic CHF-EF 93-81% this admission similar to previous NICM w ICD in place: Volume status stable, followed by cardiology, cont his Aspirin Lipitor. Cardio following.GDMT as per cardiology.  Holding diuretics.  Stephanie Coup  Storm due to hypokalemia causing cardiac arrest.  Keep mag > 2, k >4 and monitor in telemetry  Diarrhea suspected due to increase in Trulicity dose: upto 20 episodes of diarrhea per day, Trulicity discontinued diarrhea improving. C. Difficile and GI penl negative.  Improving, cont Imodium, Banatrol.  Type 2 diabetes mellitus with hyperglycemia: Trulicity discontinued due to side effects.  On Levemir, SSI.  Blood sugar is overall stable HBa1c 10.2 indicating poor outpatient control. Recent Labs  Lab 05/04/22 0412 05/04/22 0433 05/08/22 1647 05/08/22 2009 05/09/22 0015 05/09/22 0417 05/09/22 0757  GLUCAP  --    < > 184* 208* 97 143* 122*  HGBA1C 10.2*  --   --   --   --   --   --    < > = values in this interval not displayed.   Cellulitis of the buttock: On Zosyn planning for 7 to 10 days course, surgery following-with worsening WBC> ultrasound soft tissue 11/30: obtained shows region of concern along the right buttock infiltrative subcutaneous edema no abscess or drainable process observed. CT scan 11/27 no evidence of abscess or drainable fluid collection.  Continue plan as per surgery monitor WBC count, temperature. Leukocytosis WBC elevated 13-15 K.  Monitor on antibiotics as above,  has polyarthritis  and buttock cellulitis could be contributory Recent Labs  Lab 05/05/22 0635 05/06/22 1810 05/07/22 0535 05/08/22 0229 05/09/22 0149  WBC 18.0* 14.6* 13.9* 15.0* 16.5*   Polyarthritis  Right knee pain-better Left foot/right foot left hand swelling and pain; Pain/swelling on rt knee and left foot since Monday 11/27, difficulty walking. uric acid elevated suspicious for gout knee x-ray does not correlate with location of possible intra-articular  loose body. Was started on colchicine 0.3mg  bid in ICU> now has more pain and swelling on left foot, new swellign pain on rt foot, left  hand> despite being on colchicine-ortho to see today-seen by Hilbert Odor from orthopedics, discussed will add  prednisone   AKI on CKD IIIb: Baseline creatinine close to 2.3 back in August.  Improved to 2.0 stable.  Monitor Recent Labs    05/03/22 2025 05/03/22 2147 05/04/22 0412 05/04/22 1855 05/04/22 2210 05/05/22 0635 05/05/22 1533 05/06/22 1645 05/07/22 0535 05/08/22 0229 05/09/22 0149  BUN 33*  --  35* 40* 40* 43* 45* 39* 37* 30* 25*  CREATININE 2.80* 2.61* 2.62* 3.04* 2.97* 3.06* 3.08* 2.86* 2.74* 2.51* 2.02*  Hyponatremia: Improved  Class II Obesity:Patient's Body mass index is 35.49 kg/m. : Will benefit with PCP follow-up, weight loss  healthy lifestyle and outpatient sleep evaluation.  DVT prophylaxis: heparin injection 5,000 Units Start: 05/03/22 2200 SCDs Start: 05/03/22 2111 Code Status:   Code Status: Full Code Family Communication: plan of care discussed with patient at bedside. Patient status is: inpatient because of hypokalemia  Level of care: Telemetry Cardiac  Dispo: The patient is from: home  with his brother            Anticipated disposition: TBD Objective: Vitals last 24 hrs: Vitals:   05/09/22 0000 05/09/22 0420 05/09/22 0500 05/09/22 0801  BP: (!) 112/55 121/66  94/65  Pulse: 71 72  68  Resp: 20 (!) 21  16  Temp: 98.6 F (37 C) 99.4 F (37.4 C)  98.7 F (37.1 C)  TempSrc: Oral Oral  Oral  SpO2: 97% 96%  95%  Weight:   118.7 kg   Height:       Weight change: -0.8 kg  Physical Examination: General exam: AA, weak,older appearing HEENT:Oral mucosa moist, Ear/Nose WNL grossly, dentition normal. Respiratory system: bilaterally clear BS, no use of accessory muscle Cardiovascular system: S1 & S2 +, regular rate, JVD neg. Gastrointestinal system: Abdomen soft, NT,ND,BS+ Nervous System:Alert, awake, moving  extremities and grossly nonfocal Extremities: LE ankle/rt ankle swollen and tender, left hand tender Skin: No rashes,no icterus. MSK: Normal muscle bulk,tone, power   Medications reviewed:  Scheduled Meds:  aspirin EC  81 mg Oral Daily   atorvastatin  10 mg Oral Daily   Chlorhexidine Gluconate Cloth  6 each Topical Daily   colchicine  0.3 mg Oral BID   fiber supplement (BANATROL TF)  60 mL Oral BID   heparin  5,000 Units Subcutaneous Q8H   insulin aspart  0-15 Units Subcutaneous Q4H   insulin detemir  10 Units Subcutaneous BID   potassium chloride  20 mEq Oral BID   predniSONE  40 mg Oral Q breakfast   sertraline  12.5 mg Oral Daily   spironolactone  12.5 mg Oral Daily   tamsulosin  0.4 mg Oral Daily   Continuous Infusions:  piperacillin-tazobactam (ZOSYN)  IV 3.375 g (05/09/22 0618)    Diet Order             Diet heart healthy/carb modified Room service  appropriate? Yes; Fluid consistency: Thin  Diet effective now                  Intake/Output Summary (Last 24 hours) at 05/09/2022 1003 Last data filed at 05/09/2022 0017 Gross per 24 hour  Intake 740 ml  Output 550 ml  Net 190 ml   Net IO Since Admission: 3,543.5 mL [05/09/22 1003]  Wt Readings from Last 3 Encounters:  05/09/22 118.7 kg  04/04/22 118 kg  03/21/22 117.5 kg     Unresulted Labs (From admission, onward)     Start     Ordered   05/09/22 0500  CBC  Daily at 5am,   R     Question:  Specimen collection method  Answer:  Lab=Lab collect   05/08/22 0929   05/06/22 5784  Basic metabolic panel  Daily at 5am,   R     Question:  Specimen collection method  Answer:  Lab=Lab collect   05/05/22 0757   05/06/22 0500  Magnesium  Daily at 5am,   R     Question:  Specimen collection method  Answer:  Lab=Lab collect   05/05/22 0757          Data Reviewed: I have personally reviewed following labs and imaging studies CBC: Recent Labs  Lab 05/03/22 2010 05/03/22 2025 05/05/22 0635 05/06/22 1810  05/07/22 0535 05/08/22 0229 05/09/22 0149  WBC 19.5*   < > 18.0* 14.6* 13.9* 15.0* 16.5*  NEUTROABS 15.8*  --  15.3*  --   --   --   --   HGB 14.1   < > 12.4* 12.5* 11.3* 11.0* 10.8*  HCT 41.9   < > 35.9* 36.3* 33.8* 31.9* 32.5*  MCV 91.1   < > 88.4 89.0 90.4 89.1 91.0  PLT 271   < > 243 247 256 286 334   < > = values in this interval not displayed.   Basic Metabolic Panel: Recent Labs  Lab 05/04/22 0412 05/04/22 1610 05/04/22 2210 05/05/22 0635 05/05/22 1533 05/06/22 1645 05/07/22 0535 05/08/22 0229 05/09/22 0149  NA 137   < > 132* 132* 136 135 132* 136 135  K 3.3*   < > 3.1* 3.1* 4.5 3.9 3.8 3.3* 3.5  CL 102   < > 97* 98 101 101 103 104 102  CO2 20*   < > 20* 21* 20* 21* 19* 22 21*  GLUCOSE 267*   < > 229* 123* 131* 163* 142* 134* 173*  BUN 35*   < > 40* 43* 45* 39* 37* 30* 25*  CREATININE 2.62*   < > 2.97* 3.06* 3.08* 2.86* 2.74* 2.51* 2.02*  CALCIUM 8.7*   < > 8.2* 8.0* 8.6* 8.5* 7.9* 8.5* 8.4*  MG 2.4   < >  --  2.6*  --  2.6* 2.5* 2.5* 2.2  PHOS 2.1*  --  2.0* 2.6  --   --   --   --   --    < > = values in this interval not displayed.   GFR: Estimated Creatinine Clearance: 53.6 mL/min (A) (by C-G formula based on SCr of 2.02 mg/dL (H)). Liver Function Tests: Recent Labs  Lab 05/03/22 2010 05/05/22 0635 05/06/22 1645  AST 15 18 40  ALT 12 13 30   ALKPHOS 58 51 62  BILITOT 1.2 0.8 1.0  PROT 7.6 6.7 6.7  ALBUMIN 3.4* 2.6* 2.5*   Recent Labs    01/09/22 1002 03/21/22 1452  PROBNP 97 18.0   Recent  Results (from the past 240 hour(s))  MRSA Next Gen by PCR, Nasal     Status: None   Collection Time: 05/03/22 10:48 PM   Specimen: Nasal Mucosa; Nasal Swab  Result Value Ref Range Status   MRSA by PCR Next Gen NOT DETECTED NOT DETECTED Final    Comment: (NOTE) The GeneXpert MRSA Assay (FDA approved for NASAL specimens only), is one component of a comprehensive MRSA colonization surveillance program. It is not intended to diagnose MRSA infection nor to  guide or monitor treatment for MRSA infections. Test performance is not FDA approved in patients less than 108 years old. Performed at [facility], [place] [address], [place], [place] [address]   C Difficile Quick Screen w PCR reflex     Status: None   Collection Time: 05/05/22  8:35 PM   Specimen: Stool  Result Value Ref Range Status   C Diff antigen NEGATIVE NEGATIVE Final   C Diff toxin NEGATIVE NEGATIVE Final   C Diff interpretation No C. difficile detected.  Final    Comment: Performed at [facility], [place] [address], [place], [place] [address]  Gastrointestinal Panel by PCR , Stool     Status: None   Collection Time: 05/06/22  9:53 PM   Specimen: Stool  Result Value Ref Range Status   Campylobacter species NOT DETECTED NOT DETECTED Final   Plesimonas shigelloides NOT DETECTED NOT DETECTED Final   Salmonella species NOT DETECTED NOT DETECTED Final   Yersinia enterocolitica NOT DETECTED NOT DETECTED Final   Vibrio species NOT DETECTED NOT DETECTED Final   Vibrio cholerae NOT DETECTED NOT DETECTED Final   Enteroaggregative E coli (EAEC) NOT DETECTED NOT DETECTED Final   Enteropathogenic E coli (EPEC) NOT DETECTED NOT DETECTED Final   Enterotoxigenic E coli (ETEC) NOT DETECTED NOT DETECTED Final   Shiga like toxin producing E coli (STEC) NOT DETECTED NOT DETECTED Final   Shigella/Enteroinvasive E coli (EIEC) NOT DETECTED NOT DETECTED Final   Cryptosporidium NOT DETECTED NOT DETECTED Final   Cyclospora cayetanensis NOT DETECTED NOT DETECTED Final   Entamoeba histolytica NOT DETECTED NOT DETECTED Final   Giardia lamblia NOT DETECTED NOT DETECTED Final   Adenovirus F40/41 NOT DETECTED NOT DETECTED Final   Astrovirus NOT DETECTED NOT DETECTED Final   Norovirus GI/GII NOT DETECTED NOT DETECTED Final   Rotavirus A NOT DETECTED NOT DETECTED Final   Sapovirus (I, II, IV, and V) NOT DETECTED NOT DETECTED Final    Comment: Performed at [facility], [address] [place], [place] [address]    Antimicrobials: Anti-infectives (From admission, onward)    Start     Dose/Rate Route Frequency Ordered Stop   05/05/22 1400  piperacillin-tazobactam (ZOSYN) IVPB 3.375 g        3.375 g 12.5 mL/hr over 240 Minutes Intravenous Every 8 hours 05/05/22 1158     05/05/22 1030  amoxicillin-clavulanate (AUGMENTIN) 875-125 MG per tablet 1 tablet  Status:  Discontinued        1 tablet Oral Every 12 hours 05/05/22 0936 05/05/22 1158      Culture/Microbiology    Component Value Date/Time   SDES  09/18/2017 0040    BLOOD LEFT HAND Performed at [facility], [address], [place], [place] [address]    [facility]  09/18/2017 0040    BOTTLES DRAWN AEROBIC AND ANAEROBIC Blood Culture adequate volume Performed at [facility], [address], [place], [place] [address]  CULT  09/18/2017 0040    NO GROWTH 5 DAYS Performed at [facility], [place] [address] [place], [place] [address]    REPTSTATUS 09/23/2017 FINAL 09/18/2017 0040  Radiology Studies: [place] RT LOWER EXTREM LTD SOFT TISSUE NON VASCULAR  Result Date: 05/08/2022 CLINICAL DATA:  Right gluteal cellulitis EXAM: ULTRASOUND RIGHT LOWER EXTREMITY LIMITED TECHNIQUE: Ultrasound examination of the lower extremity soft tissues was performed in the area of clinical concern. COMPARISON:  None Available. FINDINGS: In the region of concern along the subcutaneous tissues in the right buttock region, there is infiltrative subcutaneous edema. No abscess or drainable process observed. IMPRESSION: 1. In the region of concern along the right buttock region, there is infiltrative subcutaneous edema. No abscess or drainable process observed. Electronically Signed   By: Van Clines M.D.   On: 05/08/2022 09:08   DG Knee 1-2 Views Right  Result Date: 05/07/2022 CLINICAL DATA:  Right knee pain EXAM: RIGHT KNEE - 1-2 VIEW COMPARISON:  None Available. FINDINGS: Normal alignment. No acute fracture or  dislocation. Corticated 8 mm ossific density seen within the posterior joint space on lateral examination may represent an intra-articular loose body. No effusion. Soft tissues are unremarkable. IMPRESSION: 1. No acute fracture or dislocation. 2. Possible 8 mm intra-articular loose body. This could be confirmed with MRI examination if indicated. Electronically Signed   By: Fidela Salisbury M.D.   On: 05/07/2022 14:24     LOS: 6 days   Antonieta Pert, MD [facility]  05/09/2022, 10:03 AM

## 2022-05-10 ENCOUNTER — Telehealth: Payer: Self-pay | Admitting: Internal Medicine

## 2022-05-10 DIAGNOSIS — I469 Cardiac arrest, cause unspecified: Secondary | ICD-10-CM | POA: Diagnosis not present

## 2022-05-10 LAB — GLUCOSE, CAPILLARY
Glucose-Capillary: 143 mg/dL — ABNORMAL HIGH (ref 70–99)
Glucose-Capillary: 204 mg/dL — ABNORMAL HIGH (ref 70–99)
Glucose-Capillary: 315 mg/dL — ABNORMAL HIGH (ref 70–99)
Glucose-Capillary: 217 mg/dL — ABNORMAL HIGH (ref 70–99)

## 2022-05-10 LAB — BASIC METABOLIC PANEL
Anion gap: 11 (ref 5–15)
BUN: 25 mg/dL — ABNORMAL HIGH (ref 6–20)
CO2: 19 mmol/L — ABNORMAL LOW (ref 22–32)
Calcium: 8.6 mg/dL — ABNORMAL LOW (ref 8.9–10.3)
Chloride: 105 mmol/L (ref 98–111)
Creatinine, Ser: 2.1 mg/dL — ABNORMAL HIGH (ref 0.61–1.24)
GFR, Estimated: 36 mL/min — ABNORMAL LOW (ref >60)
Glucose, Bld: 235 mg/dL — ABNORMAL HIGH (ref 70–99)
Potassium: 4.7 mmol/L (ref 3.5–5.1)
Sodium: 135 mmol/L (ref 135–145)

## 2022-05-10 LAB — CBC
HCT: 33.4 % — ABNORMAL LOW (ref 39.0–52.0)
Hemoglobin: 11.1 g/dL — ABNORMAL LOW (ref 13.0–17.0)
MCH: 30.1 pg (ref 26.0–34.0)
MCHC: 33.2 g/dL (ref 30.0–36.0)
MCV: 90.5 fL (ref 80.0–100.0)
Platelets: 401 10*3/uL — ABNORMAL HIGH (ref 150–400)
RBC: 3.69 MIL/uL — ABNORMAL LOW (ref 4.22–5.81)
RDW: 15.5 % (ref 11.5–15.5)
WBC: 20.2 10*3/uL — ABNORMAL HIGH (ref 4.0–10.5)
nRBC: 0 % (ref 0.0–0.2)

## 2022-05-10 LAB — MAGNESIUM: Magnesium: 2.1 mg/dL (ref 1.7–2.4)

## 2022-05-10 MED ORDER — INSULIN ASPART 100 UNIT/ML IJ SOLN
4.0000 [IU] | Freq: Three times a day (TID) | INTRAMUSCULAR | Status: DC
  Administered 2022-05-10 – 2022-05-11 (×3): 4 [IU] via SUBCUTANEOUS

## 2022-05-10 MED ORDER — POTASSIUM CHLORIDE CRYS ER 20 MEQ PO TBCR: 20.0000 meq | EXTENDED_RELEASE_TABLET | Freq: Every day | ORAL | Status: DC

## 2022-05-10 MED ORDER — INSULIN DETEMIR 100 UNIT/ML ~~LOC~~ SOLN
15.0000 [IU] | Freq: Two times a day (BID) | SUBCUTANEOUS | Status: DC
  Administered 2022-05-10 – 2022-05-11 (×2): 15 [IU] via SUBCUTANEOUS
  Filled 2022-05-10 (×3): qty 0.15

## 2022-05-10 MED ORDER — INSULIN ASPART 100 UNIT/ML IJ SOLN
0.0000 [IU] | Freq: Three times a day (TID) | INTRAMUSCULAR | Status: DC
  Administered 2022-05-10: 7 [IU] via SUBCUTANEOUS
  Administered 2022-05-10: 3 [IU] via SUBCUTANEOUS
  Administered 2022-05-11: 2 [IU] via SUBCUTANEOUS
  Administered 2022-05-11 (×2): 3 [IU] via SUBCUTANEOUS
  Administered 2022-05-12 (×2): 2 [IU] via SUBCUTANEOUS
  Administered 2022-05-12: 3 [IU] via SUBCUTANEOUS
  Administered 2022-05-13 (×2): 2 [IU] via SUBCUTANEOUS
  Administered 2022-05-13 – 2022-05-14 (×2): 3 [IU] via SUBCUTANEOUS
  Administered 2022-05-14: 5 [IU] via SUBCUTANEOUS
  Administered 2022-05-16: 2 [IU] via SUBCUTANEOUS
  Administered 2022-05-16: 3 [IU] via SUBCUTANEOUS

## 2022-05-10 MED ORDER — PREDNISONE 20 MG PO TABS
40.0000 mg | ORAL_TABLET | Freq: Two times a day (BID) | ORAL | Status: DC
  Administered 2022-05-10 – 2022-05-13 (×6): 40 mg via ORAL
  Filled 2022-05-10 (×6): qty 2

## 2022-05-10 MED ORDER — INSULIN DETEMIR 100 UNIT/ML ~~LOC~~ SOLN
5.0000 [IU] | Freq: Once | SUBCUTANEOUS | Status: AC
  Administered 2022-05-10: 5 [IU] via SUBCUTANEOUS
  Filled 2022-05-10: qty 0.05

## 2022-05-10 NOTE — Progress Notes (Signed)
Mobility Specialist - Progress Note   05/10/22 0920  Mobility  Activity Ambulated with assistance in room  Level of Assistance Contact guard assist, steadying assist  Assistive Device Front wheel walker  Distance Ambulated (ft) 20 ft  Activity Response Tolerated well  Mobility Referral Yes  $Mobility charge 1 Mobility   Pt was received in bed and agreeable to mobility. Pt c/o R/L foot pain during ambulation which limited session. Pt was returned to bed with all needs met and RN notified.   Franki Monte  Mobility Specialist Please contact via Solicitor or Rehab office at [phone]

## 2022-05-10 NOTE — Telephone Encounter (Signed)
PC placed to pt this afternoon in response to Mychart message he sent me yesterday. Patient is hospitalized after he developed severe diarrhea and weakness after taking increase dose of Trulicity the 1.5 mg dose.  Also had severe hypokalemia and V. tach storm. Patient tells me that after taking the higher dose of Trulicity he felt so weak, he felt like he had had a stroke then later developed diarrhea and was shocked by his ICD.  He states that he was was on the Trulicity 6.16 mg for 1 month but when he was seen in 04/28/2022 by the pharmacist who he thinks was a nurse, she recommended increasing the dose to 1.5 mg Qwk I told patient that I was very sorry that he had such an extreme reaction to the Trulicity.   Vomiting and diarrhea are possible side effects and if they occur, we either lower the dose or stop the medication altogether.  I empathized for what he is going through and thankful that he is getting the care that is needed.  I let him know that I will continue to follow his progress.  Patient tells me that he is doing a little better but has developed gout and an area around the rectum that is sore.Marland Kitchen

## 2022-05-10 NOTE — Progress Notes (Signed)
PROGRESS NOTE BRAIN HONEYCUTT  JKK:[id number] DOB: Nov 12, 1965 DOA: 05/03/2022 PCP: [name], MD   Brief Narrative/Hospital Course: 56 year old male with history of HFrEF and ICM status post ICD placement, type 2 diabetes, chronic low back pain with a spondylosis and spinal stenosis, obesity BMI 35, CKD 3 baseline creatinine 1.8-2.3, GERD brought to the ED for diarrhea after recently increasing his Trulicity on 71/69 having increasing diarrhea, repeated LE with ICD shock on 11/25 unable to take p.o. on 11/25 EMS called brought to the ED. In the ED had witnessed V. tach arrest underwent CPR with ROSC with 1 shock return to baseline neurological status lab with potassium 2.8 creatinine 2.85 baseline 1.8-2.3, leukocytosis 19.5 low-grade fever also hypotension, currently consulted admitted to ICU Hypokalemia stabilized with aggressive replacement.Found to have new buttock abscess/cellulitis and surgery consulted.Patient transferred to [facility] service 11/30. Surgery evaluated ultrasound obtained on drainable fluid collection and signed off. Patient continued to have poorly arthralgias seen by Hilbert Odor from orthopedics, with elevated uric acid and symptomatology consistent with gout but did not improve on colchicine changed to prednisone 12/1   Subjective: Seen and examined this morning. Reports left ankle pain is better some still has pain on the right ankle and also swollen Labs reviewed this morning potassium improved on the higher side, continue stable, bump in WBC count 15>16.5>20 after starting prednisone and no fever  Assessment and Plan:  Hypokalemia: Due to diarrhea,resolved after aggressive replacement. Monitor.continue 20 KCl  po Daily to keep goal of more than 4 given his VT Recent Labs  Lab 05/04/22 0412 05/04/22 1610 05/04/22 2210 05/05/22 0635 05/05/22 1533 05/06/22 1645 05/07/22 0535 05/08/22 0229 05/09/22 0149 05/10/22 0100  K 3.3*   < > 3.1* 3.1*   < > 3.9 3.8  3.3* 3.5 4.7  CALCIUM 8.7*   < > 8.2* 8.0*   < > 8.5* 7.9* 8.5* 8.4* 8.6*  MG 2.4   < >  --  2.6*  --  2.6* 2.5* 2.5* 2.2 2.1  PHOS 2.1*  --  2.0* 2.6  --   --   --   --   --   --    < > = values in this interval not displayed.   Hypotension suspected hypovolemic shock due to diarrhea.  Resolved Chronic systolic CHF-EF 67-89% this admission similar to previous NICM w ICD in place: Followed by cardiology on board.holding off on diuretic due to his diarrhea is volume status stable.  To start GDMT when able per cardiology. Cont Aspirin Lipitor. Net IO Since Admission: 4,263.5 mL [05/10/22 0839]   V. tach  Storm due to hypokalemia causing cardiac arrest.  Keep mag > 2, k >4 and monitor in telemetry  Diarrhea suspected due to increase in Trulicity dose: upto 20 episodes of diarrhea per day, Trulicity discontinued diarrhea improving. C. Difficile and GI penl negative.  Can continue supportive care with Imodium,Banatrol.  Type 2 diabetes mellitus with hyperglycemia: At home on Trulicity, Farxiga, NovoLog FlexPen 10 units twice daily, Lantus 30 units daily Trulicity discontinued due to side effects.  On Levemir, SSI.  Blood sugar is poorly controlled and high 200 given prednisone> increase Levemir from 10u bid to 15 u bid , add premeal 4 u tid, change to ssi 0-9 u. HBa1c 10.2 indicating poor outpatient control. Recent Labs  Lab 05/04/22 0412 05/04/22 0433 05/09/22 0757 05/09/22 1213 05/09/22 1614 05/09/22 2104 05/10/22 0604  GLUCAP  --    < > 122* 209* 247* 294* 143*  HGBA1C 10.2*  --   --   --   --   --   --    < > = values in this interval not displayed.   Cellulitis of the buttock: ultrasound soft tissue 11/30- shows region of concern along the right buttock infiltrative subcutaneous edema no abscess or drainable process observed. CT scan 11/27 no evidence of abscess or drainable fluid collection.  Surgery signed off, no need for procedure.  Empiric Zosyn will be continued, continue wound  care.  Patient w/ bump in leukocytes but after prednisone use.   Leukocytosis: WBC elevated 13-15 K> trending up further as below likely complicated by prednisone.  Remains on IV antibiotics empirically.  He is afebrile. Recent Labs  Lab 05/06/22 1810 05/07/22 0535 05/08/22 0229 05/09/22 0149 05/10/22 0100  WBC 14.6* 13.9* 15.0* 16.5* 20.2*   Polyarthritis  Right knee pain-better Left foot/right foot left hand swelling and pain; Pain/swelling on rt knee and left foot since Monday 11/27, difficulty walking. uric acid elevated suspicious for gout knee x-ray does not correlate with location of possible intra-articular loose body. Was started on colchicine 0.3mg  bid in ICU> without improvement having worsening pain involving bilateral ankle seen by Silvestre Gunner from orthopedics and discussed placed on prednisone  12/1, increast to 40mg  bid.  Previously seen by Dr. Lynann Bologna followed outpatient by podiatry for plantar fasciitis of left foot  AKI on CKD IIIb: b/l creatinine close to 2.3 back in August.  Improved to baseline 2.0 monitor Recent Labs    05/04/22 0412 05/04/22 1855 05/04/22 2210 05/05/22 0635 05/05/22 1533 05/06/22 1645 05/07/22 0535 05/08/22 0229 05/09/22 0149 05/10/22 0100  BUN 35* 40* 40* 43* 45* 39* 37* 30* 25* 25*  CREATININE 2.62* 3.04* 2.97* 3.06* 3.08* 2.86* 2.74* 2.51* 2.02* 2.10*   Hyponatremia: Improved  Class II Obesity:Patient's Body mass index is 35.49 kg/m. : Will benefit with PCP follow-up, weight loss  healthy lifestyle and outpatient sleep evaluation.  DVT prophylaxis: heparin injection 5,000 Units Start: 05/03/22 2200 SCDs Start: 05/03/22 2111 Code Status:   Code Status: Full Code Family Communication: plan of care discussed with patient at bedside.  Patient status is: inpatient because of hypokalemia Level of care: Telemetry Cardiac  Dispo: The patient is from: home  with his brother            Anticipated disposition:  Home Objective: Vitals last 24 hrs: Vitals:   05/09/22 1957 05/10/22 0322 05/10/22 0759 05/10/22 0800  BP: 124/83 116/62  103/60  Pulse: 68 (!) 52 68 93  Resp: 20 19  19   Temp: 98.2 F (36.8 C) 98.4 F (36.9 C) 98.5 F (36.9 C) 98.5 F (36.9 C)  TempSrc: Oral Oral Oral Oral  SpO2: 94% 100%  92%  Weight:      Height:       Weight change:   Physical Examination: General exam: AAox3, weak,older appearing HEENT:Oral mucosa moist, Ear/Nose WNL grossly, dentition normal. Respiratory system: bilaterally clear BS, no use of accessory muscle Cardiovascular system: S1 & S2 +, regular rate. Gastrointestinal system: Abdomen soft, obeseNT,ND,BS+ Nervous System:Alert, awake, moving extremities and grossly nonfocal Extremities: Mild edema on left and right ankle with tenderness more on the right ankle, lower extremities warm Skin: No rashes,no icterus. YYT:KPTWSF muscle bulk,tone, power   Medications reviewed:  Scheduled Meds:  aspirin EC  81 mg Oral Daily   atorvastatin  10 mg Oral Daily   Chlorhexidine Gluconate Cloth  6 each Topical Daily   colchicine  0.3 mg  Oral BID   fiber supplement (BANATROL TF)  60 mL Oral BID   heparin  5,000 Units Subcutaneous Q8H   insulin aspart  0-15 Units Subcutaneous TID WC & HS   insulin detemir  10 Units Subcutaneous BID   losartan  12.5 mg Oral Daily   potassium chloride  20 mEq Oral BID   predniSONE  40 mg Oral Q breakfast   sertraline  12.5 mg Oral Daily   spironolactone  12.5 mg Oral Daily   tamsulosin  0.4 mg Oral Daily   Continuous Infusions:  piperacillin-tazobactam (ZOSYN)  IV 3.375 g (05/10/22 4431)    Diet Order             Diet heart healthy/carb modified Room service appropriate? Yes; Fluid consistency: Thin  Diet effective now                  Intake/Output Summary (Last 24 hours) at 05/10/2022 0836 Last data filed at 05/10/2022 0400 Gross per 24 hour  Intake 1320 ml  Output 600 ml  Net 720 ml   Net IO Since  Admission: 4,263.5 mL [05/10/22 0836]  Wt Readings from Last 3 Encounters:  05/09/22 118.7 kg  04/04/22 118 kg  03/21/22 117.5 kg     Unresulted Labs (From admission, onward)     Start     Ordered   05/10/22 0500  CBC  Daily at 5am,   R     Question:  Specimen collection method  Answer:  Lab=Lab collect   05/09/22 1649   05/10/22 5400  Basic metabolic panel  Daily at 5am,   R     Question:  Specimen collection method  Answer:  Lab=Lab collect   05/09/22 1649   05/09/22 0500  CBC  Daily at 5am,   R     Question:  Specimen collection method  Answer:  Lab=Lab collect   05/08/22 0929   05/06/22 0500  Magnesium  Daily at 5am,   R     Question:  Specimen collection method  Answer:  Lab=Lab collect   05/05/22 0757          Data Reviewed: I have personally reviewed following labs and imaging studies CBC: Recent Labs  Lab 05/03/22 2010 05/03/22 2025 05/05/22 0635 05/06/22 1810 05/07/22 0535 05/08/22 0229 05/09/22 0149 05/10/22 0100  WBC 19.5*   < > 18.0* 14.6* 13.9* 15.0* 16.5* 20.2*  NEUTROABS 15.8*  --  15.3*  --   --   --   --   --   HGB 14.1   < > 12.4* 12.5* 11.3* 11.0* 10.8* 11.1*  HCT 41.9   < > 35.9* 36.3* 33.8* 31.9* 32.5* 33.4*  MCV 91.1   < > 88.4 89.0 90.4 89.1 91.0 90.5  PLT 271   < > 243 247 256 286 334 401*   < > = values in this interval not displayed.   Basic Metabolic Panel: Recent Labs  Lab 05/04/22 0412 05/04/22 1610 05/04/22 2210 05/05/22 0635 05/05/22 1533 05/06/22 1645 05/07/22 0535 05/08/22 0229 05/09/22 0149 05/10/22 0100  NA 137   < > 132* 132*   < > 135 132* 136 135 135  K 3.3*   < > 3.1* 3.1*   < > 3.9 3.8 3.3* 3.5 4.7  CL 102   < > 97* 98   < > 101 103 104 102 105  CO2 20*   < > 20* 21*   < > 21* 19* 22 21* 19*  GLUCOSE 267*   < > 229* 123*   < > 163* 142* 134* 173* 235*  BUN 35*   < > 40* 43*   < > 39* 37* 30* 25* 25*  CREATININE 2.62*   < > 2.97* 3.06*   < > 2.86* 2.74* 2.51* 2.02* 2.10*  CALCIUM 8.7*   < > 8.2* 8.0*   < > 8.5*  7.9* 8.5* 8.4* 8.6*  MG 2.4   < >  --  2.6*  --  2.6* 2.5* 2.5* 2.2 2.1  PHOS 2.1*  --  2.0* 2.6  --   --   --   --   --   --    < > = values in this interval not displayed.   GFR: Estimated Creatinine Clearance: 51.6 mL/min (A) (by C-G formula based on SCr of 2.1 mg/dL (H)). Liver Function Tests: Recent Labs  Lab 05/03/22 2010 05/05/22 0635 05/06/22 1645  AST 15 18 40  ALT 12 13 30   ALKPHOS 58 51 62  BILITOT 1.2 0.8 1.0  PROT 7.6 6.7 6.7  ALBUMIN 3.4* 2.6* 2.5*   Recent Labs    01/09/22 1002 03/21/22 1452  PROBNP 97 18.0   Recent Results (from the past 240 hour(s))  MRSA Next Gen by PCR, Nasal     Status: None   Collection Time: 05/03/22 10:48 PM   Specimen: Nasal Mucosa; Nasal Swab  Result Value Ref Range Status   MRSA by PCR Next Gen NOT DETECTED NOT DETECTED Final    Comment: (NOTE) The GeneXpert MRSA Assay (FDA approved for NASAL specimens only), is one component of a comprehensive MRSA colonization surveillance program. It is not intended to diagnose MRSA infection nor to guide or monitor treatment for MRSA infections. Test performance is not FDA approved in patients less than 23 years old. Performed at [facility], [place] [address] [place], [place] [address]   C Difficile Quick Screen w PCR reflex     Status: None   Collection Time: 05/05/22  8:35 PM   Specimen: Stool  Result Value Ref Range Status   C Diff antigen NEGATIVE NEGATIVE Final   C Diff toxin NEGATIVE NEGATIVE Final   C Diff interpretation No C. difficile detected.  Final    Comment: Performed at [facility], [place] [address], [place], [place] [address]  Gastrointestinal Panel by PCR , Stool     Status: None   Collection Time: 05/06/22  9:53 PM   Specimen: Stool  Result Value Ref Range Status   Campylobacter species NOT DETECTED NOT DETECTED Final   Plesimonas shigelloides NOT DETECTED NOT DETECTED Final   Salmonella species NOT DETECTED NOT DETECTED Final   Yersinia enterocolitica  NOT DETECTED NOT DETECTED Final   Vibrio species NOT DETECTED NOT DETECTED Final   Vibrio cholerae NOT DETECTED NOT DETECTED Final   Enteroaggregative E coli (EAEC) NOT DETECTED NOT DETECTED Final   Enteropathogenic E coli (EPEC) NOT DETECTED NOT DETECTED Final   Enterotoxigenic E coli (ETEC) NOT DETECTED NOT DETECTED Final   Shiga like toxin producing E coli (STEC) NOT DETECTED NOT DETECTED Final   Shigella/Enteroinvasive E coli (EIEC) NOT DETECTED NOT DETECTED Final   Cryptosporidium NOT DETECTED NOT DETECTED Final   Cyclospora cayetanensis NOT DETECTED NOT DETECTED Final   Entamoeba histolytica NOT DETECTED NOT DETECTED Final   Giardia lamblia NOT DETECTED NOT DETECTED Final   Adenovirus F40/41 NOT DETECTED NOT DETECTED Final   Astrovirus NOT DETECTED NOT DETECTED Final  Norovirus GI/GII NOT DETECTED NOT DETECTED Final   Rotavirus A NOT DETECTED NOT DETECTED Final   Sapovirus (I, II, IV, and V) NOT DETECTED NOT DETECTED Final    Comment: Performed at [facility], [address] [place], [place] [address]    Antimicrobials: Anti-infectives (From admission, onward)    Start     Dose/Rate Route Frequency Ordered Stop   05/05/22 1400  piperacillin-tazobactam (ZOSYN) IVPB 3.375 g        3.375 g 12.5 mL/hr over 240 Minutes Intravenous Every 8 hours 05/05/22 1158     05/05/22 1030  amoxicillin-clavulanate (AUGMENTIN) 875-125 MG per tablet 1 tablet  Status:  Discontinued        1 tablet Oral Every 12 hours 05/05/22 0936 05/05/22 1158      Culture/Microbiology    Component Value Date/Time   SDES  09/18/2017 0040    BLOOD LEFT HAND Performed at [facility], [address], [place], [place] [address]    [facility]  09/18/2017 0040    BOTTLES DRAWN AEROBIC AND ANAEROBIC Blood Culture adequate volume Performed at [facility], [address], [place], [place] [address]    CULT  09/18/2017 0040    NO GROWTH 5 DAYS Performed at [facility] [address], [place], [place] [address]    REPTSTATUS 09/23/2017 FINAL 09/18/2017 0040  Radiology Studies: [place] RT LOWER EXTREM LTD SOFT TISSUE NON VASCULAR  Result Date: 05/08/2022 CLINICAL DATA:  Right gluteal cellulitis EXAM: ULTRASOUND RIGHT LOWER EXTREMITY LIMITED TECHNIQUE: Ultrasound examination of the lower extremity soft tissues was performed in the area of clinical concern. COMPARISON:  None Available. FINDINGS: In the region of concern along the subcutaneous tissues in the right buttock region, there is infiltrative subcutaneous edema. No abscess or drainable process observed. IMPRESSION: 1. In the region of concern along the right buttock region, there is infiltrative subcutaneous edema. No abscess or drainable process observed. Electronically Signed   By: Van Clines M.D.   On: 05/08/2022 09:08     LOS: 7 days   Antonieta Pert, MD [facility]  05/10/2022, 8:36 AM

## 2022-05-11 DIAGNOSIS — I469 Cardiac arrest, cause unspecified: Secondary | ICD-10-CM | POA: Diagnosis not present

## 2022-05-11 LAB — GLUCOSE, CAPILLARY
Glucose-Capillary: 236 mg/dL — ABNORMAL HIGH (ref 70–99)
Glucose-Capillary: 163 mg/dL — ABNORMAL HIGH (ref 70–99)
Glucose-Capillary: 250 mg/dL — ABNORMAL HIGH (ref 70–99)
Glucose-Capillary: 283 mg/dL — ABNORMAL HIGH (ref 70–99)

## 2022-05-11 LAB — MAGNESIUM: Magnesium: 2.2 mg/dL (ref 1.7–2.4)

## 2022-05-11 LAB — BASIC METABOLIC PANEL
Anion gap: 9 (ref 5–15)
BUN: 31 mg/dL — ABNORMAL HIGH (ref 6–20)
CO2: 20 mmol/L — ABNORMAL LOW (ref 22–32)
Calcium: 8.4 mg/dL — ABNORMAL LOW (ref 8.9–10.3)
Chloride: 106 mmol/L (ref 98–111)
Creatinine, Ser: 1.99 mg/dL — ABNORMAL HIGH (ref 0.61–1.24)
GFR, Estimated: 38 mL/min — ABNORMAL LOW (ref >60)
Glucose, Bld: 229 mg/dL — ABNORMAL HIGH (ref 70–99)
Potassium: 5.2 mmol/L — ABNORMAL HIGH (ref 3.5–5.1)
Sodium: 135 mmol/L (ref 135–145)

## 2022-05-11 LAB — CBC
HCT: 33.2 % — ABNORMAL LOW (ref 39.0–52.0)
Hemoglobin: 11.6 g/dL — ABNORMAL LOW (ref 13.0–17.0)
MCH: 31.3 pg (ref 26.0–34.0)
MCHC: 34.9 g/dL (ref 30.0–36.0)
MCV: 89.5 fL (ref 80.0–100.0)
Platelets: 451 10*3/uL — ABNORMAL HIGH (ref 150–400)
RBC: 3.71 MIL/uL — ABNORMAL LOW (ref 4.22–5.81)
RDW: 15.2 % (ref 11.5–15.5)
WBC: 18.2 10*3/uL — ABNORMAL HIGH (ref 4.0–10.5)
nRBC: 0 % (ref 0.0–0.2)

## 2022-05-11 LAB — POTASSIUM: Potassium: 4.8 mmol/L (ref 3.5–5.1)

## 2022-05-11 MED ORDER — INSULIN ASPART 100 UNIT/ML IJ SOLN
6.0000 [IU] | Freq: Three times a day (TID) | INTRAMUSCULAR | Status: DC
  Administered 2022-05-11 – 2022-05-12 (×3): 6 [IU] via SUBCUTANEOUS

## 2022-05-11 MED ORDER — INSULIN DETEMIR 100 UNIT/ML ~~LOC~~ SOLN
17.0000 [IU] | Freq: Two times a day (BID) | SUBCUTANEOUS | Status: DC
  Administered 2022-05-11 – 2022-05-12 (×2): 17 [IU] via SUBCUTANEOUS
  Filled 2022-05-11 (×3): qty 0.17

## 2022-05-11 MED ORDER — LOSARTAN POTASSIUM 25 MG PO TABS
12.5000 mg | ORAL_TABLET | Freq: Every day | ORAL | Status: DC
  Administered 2022-05-11: 12.5 mg via ORAL
  Filled 2022-05-11: qty 1

## 2022-05-11 NOTE — Progress Notes (Signed)
PROGRESS NOTE [name]  FFM:[id number] DOB: 1966-03-24 DOA: 05/03/2022 PCP: [name], MD   Brief Narrative/Hospital Course: 56 year old male with history of HFrEF and ICM status post ICD placement, type 2 diabetes, chronic low back pain with a spondylosis and spinal stenosis, obesity BMI 35, CKD 3 baseline creatinine 1.8-2.3, GERD brought to the ED for diarrhea after recently increasing his Trulicity on 57/01 having increasing diarrhea, repeated LE with ICD shock on 11/25 unable to take p.o. on 11/25 EMS called brought to the ED. In the ED had witnessed V. tach arrest underwent CPR with ROSC with 1 shock return to baseline neurological status lab with potassium 2.8 creatinine 2.85 baseline 1.8-2.3, leukocytosis 19.5 low-grade fever also hypotension, currently consulted admitted to ICU Hypokalemia stabilized with aggressive replacement.Found to have new buttock abscess/cellulitis and surgery consulted.Patient transferred to [facility] service 11/30. Surgery evaluated ultrasound obtained on drainable fluid collection and signed off. Patient continued to have poorly arthralgias seen by Hilbert Odor from orthopedics, with elevated uric acid and symptomatology consistent with gout but did not improve on colchicine changed to prednisone 12/1   Subjective: Patient reports he feels much improved after increasing the prednisone  Now he is heels hurt mostly, ankles are less swollen and painful Labs reviewed he has had leukocytosis downtrending, potassium on higher side 5.2 creatinine much better 1.9 Blood sugar peaked up to 315 yesterday evening   Assessment and Plan:  Hypokalemia: Due to diarrhea,resolved after aggressive replacement.  Discontinue potassium supplementation as potassium at 4.2 today, recheck. keep goal of more than 4 given his VT Recent Labs  Lab 05/04/22 2210 05/05/22 0635 05/05/22 1533 05/07/22 0535 05/08/22 0229 05/09/22 0149 05/10/22 0100 05/11/22 0051  K 3.1*  3.1*   < > 3.8 3.3* 3.5 4.7 5.2*  CALCIUM 8.2* 8.0*   < > 7.9* 8.5* 8.4* 8.6* 8.4*  MG  --  2.6*   < > 2.5* 2.5* 2.2 2.1 2.2  PHOS 2.0* 2.6  --   --   --   --   --   --    < > = values in this interval not displayed.   Hypotension suspected hypovolemic shock due to diarrhea.  Resolved  Chronic systolic CHF-EF 77-93% this admission similar to previous NICM w ICD in place: Currently euvolemic.  Seen by cardiology. Holding off on diuretic due to his diarrhea.  Cardiology to consider GDMT when able.Cont Aspirin Lipitor.  Monitor intake output and daily weight as below. Net IO Since Admission: 3,533.5 mL [05/11/22 1054]  Filed Weights   05/08/22 0500 05/09/22 0500 05/11/22 0401  Weight: 119.5 kg 118.7 kg 119.7 kg    V. tach  Storm due to hypokalemia causing cardiac arrest.  Keep mag > 2, k >4 and monitor in telemetry  Diarrhea suspected due to increase in Trulicity dose: upto 20 episodes of diarrhea per day, Trulicity discontinued> C. Difficile and GI penl negative.  Diarrhea is better,continue supportive care with Imodium,Banatrol.  Type 2 diabetes mellitus with uncontrolled hyperglycemia: At home on Trulicity, Farxiga, NovoLog FlexPen 10 units twice daily, Lantus 30 units daily.Trulicity discontinued due to side effects. Blood sugar is poorly controlled due to steroid, increase Levemir to 17 u bid, premeal novolog 6 u tid,cont ssi 0-9 u. HBa1c 10.2 indicating poor outpatient control.  DM coordinator consulted Recent Labs  Lab 05/10/22 0604 05/10/22 1105 05/10/22 1703 05/10/22 2122 05/11/22 0543  GLUCAP 143* 204* 315* 217* 236*    Cellulitis of the buttock: ultrasound soft tissue 11/30- shows  region of concern along the right buttock infiltrative subcutaneous edema no abscess or drainable process observed. CT scan 11/27 no evidence of abscess or drainable fluid collection.  Surgery signed off, no need for procedure.  Empiric Zosyn will be continued, continue wound care.  Patient w/ bump in  leukocytes but after prednisone use.   Leukocytosis: WBC elevated 13-15 K> peaked at 20.2 now downtrending suspect in the setting of prednisone use, and also contributed by polyarthritis/gout, on Zosyn for cellulitis of the buttock.  No fever. Recent Labs  Lab 05/07/22 0535 05/08/22 0229 05/09/22 0149 05/10/22 0100 05/11/22 0051  WBC 13.9* 15.0* 16.5* 20.2* 18.2*    Polyarthritis  Right knee pain-better Left foot/right foot left hand swelling and pain; Pain/swelling on rt knee and left foot since Monday 11/27, difficulty walking. uric acid elevated suspicious for gout knee x-ray does not correlate with location of possible intra-articular loose body. Was started on colchicine 0.3mg  bid in ICU> without improvement having worsening pain involving bilateral ankle seen by Silvestre Gunner from orthopedics and discussed> started prednisone  12/1, increased to 40mg  bid 12/2 with improving ankle swelling and pain.  Continue the same monitor levels closely.Previously seen by Dr. Lynann Bologna followed outpatient by podiatry for plantar fasciitis of left foot-he does have pain on his heel  AKI on CKD IIIb: b/l creatinine close to 2.3 back in August.  Nicely improved as below.  Monitor Recent Labs    05/04/22 1855 05/04/22 2210 05/05/22 0635 05/05/22 1533 05/06/22 1645 05/07/22 0535 05/08/22 0229 05/09/22 0149 05/10/22 0100 05/11/22 0051  BUN 40* 40* 43* 45* 39* 37* 30* 25* 25* 31*  CREATININE 3.04* 2.97* 3.06* 3.08* 2.86* 2.74* 2.51* 2.02* 2.10* 1.99*    Hyponatremia: Improved  Class II Obesity:Patient's Body mass index is 35.8 kg/m. : Will benefit with PCP follow-up, weight loss  healthy lifestyle and outpatient sleep evaluation.  DVT prophylaxis: heparin injection 5,000 Units Start: 05/03/22 2200 SCDs Start: 05/03/22 2111 Code Status:   Code Status: Full Code Family Communication: plan of care discussed with patient at bedside.  Patient status is: inpatient because of  hypokalemia Level of care: Telemetry Cardiac  Dispo: The patient is from: home  with his brother            Anticipated disposition: Home in next 24 hours  Objective: Vitals last 24 hrs: Vitals:   05/10/22 1929 05/10/22 2352 05/11/22 0401 05/11/22 0730  BP: (!) 102/48 (!) 116/56 (!) 114/59 115/69  Pulse: 63 60 60 61  Resp: 20 16 20 18   Temp: 97.6 F (36.4 C) 98.4 F (36.9 C) 98.5 F (36.9 C) 98.3 F (36.8 C)  TempSrc: Oral Oral Oral Oral  SpO2: 98% 99% 100% 94%  Weight:   119.7 kg   Height:       Weight change:   Physical Examination: General exam: alert awake oriented pleasant, older than stated age HEENT:Oral mucosa moist, Ear/Nose WNL grossly Respiratory system: Bilaterally clear BS,no use of accessory muscle Cardiovascular system: S1 & S2 +, No JVD. Gastrointestinal system: Abdomen soft,NT,ND, BS+ Nervous System: Alert, awake, moving extremities well  Extremities: LE edema neg, some tenderness on his heel bilateral ankle mildly tender swelling improving  Skin: No rashes,no icterus. MSK: Normal muscle bulk,tone, power   Medications reviewed:  Scheduled Meds:  aspirin EC  81 mg Oral Daily   atorvastatin  10 mg Oral Daily   colchicine  0.3 mg Oral BID   fiber supplement (BANATROL TF)  60 mL Oral BID  heparin  5,000 Units Subcutaneous Q8H   insulin aspart  0-9 Units Subcutaneous TID WC   insulin aspart  4 Units Subcutaneous TID WC   insulin detemir  15 Units Subcutaneous BID   losartan  12.5 mg Oral Daily   predniSONE  40 mg Oral BID WC   sertraline  12.5 mg Oral Daily   spironolactone  12.5 mg Oral Daily   tamsulosin  0.4 mg Oral Daily   Continuous Infusions:  piperacillin-tazobactam (ZOSYN)  IV 3.375 g (05/11/22 0640)    Diet Order             Diet heart healthy/carb modified Room service appropriate? Yes; Fluid consistency: Thin  Diet effective now                  Intake/Output Summary (Last 24 hours) at 05/11/2022 1054 Last data filed at  05/11/2022 0402 Gross per 24 hour  Intake 120 ml  Output 850 ml  Net -730 ml    Net IO Since Admission: 3,533.5 mL [05/11/22 1054]  Wt Readings from Last 3 Encounters:  05/11/22 119.7 kg  04/04/22 118 kg  03/21/22 117.5 kg     Unresulted Labs (From admission, onward)     Start     Ordered   05/11/22 1000  Potassium  Once-Timed,   TIMED       Question:  Specimen collection method  Answer:  Lab=Lab collect   05/11/22 0617   05/10/22 0500  CBC  Daily at 5am,   R     Question:  Specimen collection method  Answer:  Lab=Lab collect   05/09/22 1649   05/10/22 8185  Basic metabolic panel  Daily at 5am,   R     Question:  Specimen collection method  Answer:  Lab=Lab collect   05/09/22 1649   05/09/22 0500  CBC  Daily at 5am,   R     Question:  Specimen collection method  Answer:  Lab=Lab collect   05/08/22 0929   05/06/22 0500  Magnesium  Daily at 5am,   R     Question:  Specimen collection method  Answer:  Lab=Lab collect   05/05/22 0757          Data Reviewed: I have personally reviewed following labs and imaging studies CBC: Recent Labs  Lab 05/05/22 0635 05/06/22 1810 05/07/22 0535 05/08/22 0229 05/09/22 0149 05/10/22 0100 05/11/22 0051  WBC 18.0*   < > 13.9* 15.0* 16.5* 20.2* 18.2*  NEUTROABS 15.3*  --   --   --   --   --   --   HGB 12.4*   < > 11.3* 11.0* 10.8* 11.1* 11.6*  HCT 35.9*   < > 33.8* 31.9* 32.5* 33.4* 33.2*  MCV 88.4   < > 90.4 89.1 91.0 90.5 89.5  PLT 243   < > 256 286 334 401* 451*   < > = values in this interval not displayed.    Basic Metabolic Panel: Recent Labs  Lab 05/04/22 2210 05/05/22 0635 05/05/22 1533 05/07/22 0535 05/08/22 0229 05/09/22 0149 05/10/22 0100 05/11/22 0051  NA 132* 132*   < > 132* 136 135 135 135  K 3.1* 3.1*   < > 3.8 3.3* 3.5 4.7 5.2*  CL 97* 98   < > 103 104 102 105 106  CO2 20* 21*   < > 19* 22 21* 19* 20*  GLUCOSE 229* 123*   < > 142* 134* 173* 235* 229*  BUN 40*  43*   < > 37* 30* 25* 25* 31*  CREATININE  2.97* 3.06*   < > 2.74* 2.51* 2.02* 2.10* 1.99*  CALCIUM 8.2* 8.0*   < > 7.9* 8.5* 8.4* 8.6* 8.4*  MG  --  2.6*   < > 2.5* 2.5* 2.2 2.1 2.2  PHOS 2.0* 2.6  --   --   --   --   --   --    < > = values in this interval not displayed.    GFR: Estimated Creatinine Clearance: 54.7 mL/min (A) (by C-G formula based on SCr of 1.99 mg/dL (H)). Liver Function Tests: Recent Labs  Lab 05/05/22 0635 05/06/22 1645  AST 18 40  ALT 13 30  ALKPHOS 51 62  BILITOT 0.8 1.0  PROT 6.7 6.7  ALBUMIN 2.6* 2.5*    Recent Labs    01/09/22 1002 03/21/22 1452  PROBNP 97 18.0    Recent Results (from the past 240 hour(s))  MRSA Next Gen by PCR, Nasal     Status: None   Collection Time: 05/03/22 10:48 PM   Specimen: Nasal Mucosa; Nasal Swab  Result Value Ref Range Status   MRSA by PCR Next Gen NOT DETECTED NOT DETECTED Final    Comment: (NOTE) The GeneXpert MRSA Assay (FDA approved for NASAL specimens only), is one component of a comprehensive MRSA colonization surveillance program. It is not intended to diagnose MRSA infection nor to guide or monitor treatment for MRSA infections. Test performance is not FDA approved in patients less than 6 years old. Performed at [facility], [place] [address], [place], [place] [address]   C Difficile Quick Screen w PCR reflex     Status: None   Collection Time: 05/05/22  8:35 PM   Specimen: Stool  Result Value Ref Range Status   C Diff antigen NEGATIVE NEGATIVE Final   C Diff toxin NEGATIVE NEGATIVE Final   C Diff interpretation No C. difficile detected.  Final    Comment: Performed at [facility], [place] [address] [place], [place] [address]  Gastrointestinal Panel by PCR , Stool     Status: None   Collection Time: 05/06/22  9:53 PM   Specimen: Stool  Result Value Ref Range Status   Campylobacter species NOT DETECTED NOT DETECTED Final   Plesimonas shigelloides NOT DETECTED NOT DETECTED Final   Salmonella species NOT DETECTED NOT DETECTED  Final   Yersinia enterocolitica NOT DETECTED NOT DETECTED Final   Vibrio species NOT DETECTED NOT DETECTED Final   Vibrio cholerae NOT DETECTED NOT DETECTED Final   Enteroaggregative E coli (EAEC) NOT DETECTED NOT DETECTED Final   Enteropathogenic E coli (EPEC) NOT DETECTED NOT DETECTED Final   Enterotoxigenic E coli (ETEC) NOT DETECTED NOT DETECTED Final   Shiga like toxin producing E coli (STEC) NOT DETECTED NOT DETECTED Final   Shigella/Enteroinvasive E coli (EIEC) NOT DETECTED NOT DETECTED Final   Cryptosporidium NOT DETECTED NOT DETECTED Final   Cyclospora cayetanensis NOT DETECTED NOT DETECTED Final   Entamoeba histolytica NOT DETECTED NOT DETECTED Final   Giardia lamblia NOT DETECTED NOT DETECTED Final   Adenovirus F40/41 NOT DETECTED NOT DETECTED Final   Astrovirus NOT DETECTED NOT DETECTED Final   Norovirus GI/GII NOT DETECTED NOT DETECTED Final   Rotavirus A NOT DETECTED NOT DETECTED Final   Sapovirus (I, II, IV, and V) NOT DETECTED NOT DETECTED Final    Comment: Performed at [facility], [address] [place], [place] [address]  Antimicrobials: Anti-infectives (From admission, onward)    Start     Dose/Rate Route Frequency Ordered Stop   05/05/22 1400  piperacillin-tazobactam (ZOSYN) IVPB 3.375 g        3.375 g 12.5 mL/hr over 240 Minutes Intravenous Every 8 hours 05/05/22 1158     05/05/22 1030  amoxicillin-clavulanate (AUGMENTIN) 875-125 MG per tablet 1 tablet  Status:  Discontinued        1 tablet Oral Every 12 hours 05/05/22 0936 05/05/22 1158      Culture/Microbiology    Component Value Date/Time   SDES  09/18/2017 0040    BLOOD LEFT HAND Performed at [facility], [address] [place], [place] [address]    [facility]  09/18/2017 0040    BOTTLES DRAWN AEROBIC AND ANAEROBIC Blood Culture adequate volume Performed at [facility], [address] [place], [place] [address]    CULT  09/18/2017 0040    NO GROWTH 5  DAYS Performed at [facility] [address] [place], [place] [address]    REPTSTATUS 09/23/2017 FINAL 09/18/2017 0040  Radiology Studies: No results found.   LOS: 8 days   Antonieta Pert, MD [facility]  05/11/2022, 10:54 AM

## 2022-05-12 ENCOUNTER — Other Ambulatory Visit: Payer: Self-pay | Admitting: Allergy & Immunology

## 2022-05-12 ENCOUNTER — Inpatient Hospital Stay (HOSPITAL_COMMUNITY)
Admission: EM | Disposition: A | Discharge status: Home / Self Care | Payer: Self-pay | Source: Home / Self Care | Attending: Internal Medicine

## 2022-05-12 ENCOUNTER — Encounter: Payer: Medicaid Other | Admitting: Cardiovascular Disease

## 2022-05-12 DIAGNOSIS — I462 Cardiac arrest due to underlying cardiac condition: Secondary | ICD-10-CM | POA: Diagnosis not present

## 2022-05-12 DIAGNOSIS — E875 Hyperkalemia: Secondary | ICD-10-CM

## 2022-05-12 DIAGNOSIS — I469 Cardiac arrest, cause unspecified: Secondary | ICD-10-CM | POA: Diagnosis not present

## 2022-05-12 DIAGNOSIS — R079 Chest pain, unspecified: Secondary | ICD-10-CM | POA: Diagnosis not present

## 2022-05-12 DIAGNOSIS — R571 Hypovolemic shock: Secondary | ICD-10-CM | POA: Diagnosis not present

## 2022-05-12 DIAGNOSIS — N179 Acute kidney failure, unspecified: Secondary | ICD-10-CM | POA: Diagnosis not present

## 2022-05-12 DIAGNOSIS — D631 Anemia in chronic kidney disease: Secondary | ICD-10-CM | POA: Diagnosis not present

## 2022-05-12 DIAGNOSIS — I77810 Thoracic aortic ectasia: Secondary | ICD-10-CM | POA: Diagnosis not present

## 2022-05-12 DIAGNOSIS — M6008 Infective myositis, other site: Secondary | ICD-10-CM | POA: Diagnosis not present

## 2022-05-12 DIAGNOSIS — I472 Ventricular tachycardia, unspecified: Secondary | ICD-10-CM | POA: Diagnosis not present

## 2022-05-12 DIAGNOSIS — Z8616 Personal history of COVID-19: Secondary | ICD-10-CM | POA: Diagnosis not present

## 2022-05-12 DIAGNOSIS — I130 Hypertensive heart and chronic kidney disease with heart failure and stage 1 through stage 4 chronic kidney disease, or unspecified chronic kidney disease: Secondary | ICD-10-CM | POA: Diagnosis not present

## 2022-05-12 DIAGNOSIS — I5022 Chronic systolic (congestive) heart failure: Secondary | ICD-10-CM | POA: Diagnosis not present

## 2022-05-12 DIAGNOSIS — N1831 Chronic kidney disease, stage 3a: Secondary | ICD-10-CM | POA: Diagnosis not present

## 2022-05-12 DIAGNOSIS — E871 Hypo-osmolality and hyponatremia: Secondary | ICD-10-CM | POA: Diagnosis not present

## 2022-05-12 DIAGNOSIS — N1832 Chronic kidney disease, stage 3b: Secondary | ICD-10-CM | POA: Diagnosis not present

## 2022-05-12 DIAGNOSIS — E1122 Type 2 diabetes mellitus with diabetic chronic kidney disease: Secondary | ICD-10-CM | POA: Diagnosis not present

## 2022-05-12 HISTORY — PX: RIGHT/LEFT HEART CATH AND CORONARY ANGIOGRAPHY: CATH118266

## 2022-05-12 LAB — CBC
HCT: 33.8 % — ABNORMAL LOW (ref 39.0–52.0)
Hemoglobin: 11.5 g/dL — ABNORMAL LOW (ref 13.0–17.0)
MCH: 30.5 pg (ref 26.0–34.0)
MCHC: 34 g/dL (ref 30.0–36.0)
MCV: 89.7 fL (ref 80.0–100.0)
Platelets: 506 10*3/uL — ABNORMAL HIGH (ref 150–400)
RBC: 3.77 MIL/uL — ABNORMAL LOW (ref 4.22–5.81)
RDW: 15 % (ref 11.5–15.5)
WBC: 15.2 10*3/uL — ABNORMAL HIGH (ref 4.0–10.5)
nRBC: 0 % (ref 0.0–0.2)

## 2022-05-12 LAB — HEPATIC FUNCTION PANEL
ALT: 26 U/L (ref 0–44)
AST: 18 U/L (ref 15–41)
Albumin: 2.5 g/dL — ABNORMAL LOW (ref 3.5–5.0)
Alkaline Phosphatase: 58 U/L (ref 38–126)
Bilirubin, Direct: <0.1 mg/dL (ref 0.0–0.2)
Total Bilirubin: 0.4 mg/dL (ref 0.3–1.2)
Total Protein: 6.8 g/dL (ref 6.5–8.1)

## 2022-05-12 LAB — BASIC METABOLIC PANEL
Anion gap: 7 (ref 5–15)
BUN: 41 mg/dL — ABNORMAL HIGH (ref 6–20)
CO2: 20 mmol/L — ABNORMAL LOW (ref 22–32)
Calcium: 8.2 mg/dL — ABNORMAL LOW (ref 8.9–10.3)
Chloride: 106 mmol/L (ref 98–111)
Creatinine, Ser: 2 mg/dL — ABNORMAL HIGH (ref 0.61–1.24)
GFR, Estimated: 38 mL/min — ABNORMAL LOW (ref >60)
Glucose, Bld: 327 mg/dL — ABNORMAL HIGH (ref 70–99)
Potassium: 5.2 mmol/L — ABNORMAL HIGH (ref 3.5–5.1)
Sodium: 133 mmol/L — ABNORMAL LOW (ref 135–145)

## 2022-05-12 LAB — POCT I-STAT EG7
Acid-base deficit: 4 mmol/L — ABNORMAL HIGH (ref 0.0–2.0)
Acid-base deficit: 5 mmol/L — ABNORMAL HIGH (ref 0.0–2.0)
Bicarbonate: 19.9 mmol/L — ABNORMAL LOW (ref 20.0–28.0)
Bicarbonate: 20.7 mmol/L (ref 20.0–28.0)
Calcium, Ion: 1.24 mmol/L (ref 1.15–1.40)
Calcium, Ion: 1.25 mmol/L (ref 1.15–1.40)
HCT: 36 % — ABNORMAL LOW (ref 39.0–52.0)
HCT: 36 % — ABNORMAL LOW (ref 39.0–52.0)
Hemoglobin: 12.2 g/dL — ABNORMAL LOW (ref 13.0–17.0)
Hemoglobin: 12.2 g/dL — ABNORMAL LOW (ref 13.0–17.0)
O2 Saturation: 63 %
O2 Saturation: 63 %
Potassium: 5 mmol/L (ref 3.5–5.1)
Potassium: 5 mmol/L (ref 3.5–5.1)
Sodium: 137 mmol/L (ref 135–145)
Sodium: 137 mmol/L (ref 135–145)
TCO2: 21 mmol/L — ABNORMAL LOW (ref 22–32)
TCO2: 22 mmol/L (ref 22–32)
pCO2, Ven: 34.7 mmHg — ABNORMAL LOW (ref 44–60)
pCO2, Ven: 35.7 mmHg — ABNORMAL LOW (ref 44–60)
pH, Ven: 7.366 (ref 7.25–7.43)
pH, Ven: 7.37 (ref 7.25–7.43)
pO2, Ven: 33 mmHg (ref 32–45)
pO2, Ven: 33 mmHg (ref 32–45)

## 2022-05-12 LAB — POTASSIUM: Potassium: 4.3 mmol/L (ref 3.5–5.1)

## 2022-05-12 LAB — GLUCOSE, CAPILLARY
Glucose-Capillary: 246 mg/dL — ABNORMAL HIGH (ref 70–99)
Glucose-Capillary: 193 mg/dL — ABNORMAL HIGH (ref 70–99)
Glucose-Capillary: 193 mg/dL — ABNORMAL HIGH (ref 70–99)
Glucose-Capillary: 249 mg/dL — ABNORMAL HIGH (ref 70–99)

## 2022-05-12 LAB — MAGNESIUM: Magnesium: 2.2 mg/dL (ref 1.7–2.4)

## 2022-05-12 LAB — TROPONIN I (HIGH SENSITIVITY): Troponin I (High Sensitivity): 6 ng/L (ref <18)

## 2022-05-12 SURGERY — RIGHT/LEFT HEART CATH AND CORONARY ANGIOGRAPHY
Anesthesia: LOCAL

## 2022-05-12 MED ORDER — SODIUM CHLORIDE 0.9% FLUSH: 3.0000 mL | INTRAVENOUS | Status: DC | PRN

## 2022-05-12 MED ORDER — MIDAZOLAM HCL 2 MG/2ML IJ SOLN
INTRAMUSCULAR | Status: AC
  Filled 2022-05-12: qty 2

## 2022-05-12 MED ORDER — HEPARIN (PORCINE) IN NACL 1000-0.9 UT/500ML-% IV SOLN
INTRAVENOUS | Status: AC
  Filled 2022-05-12: qty 1000

## 2022-05-12 MED ORDER — HYDRALAZINE HCL 20 MG/ML IJ SOLN: 10.0000 mg | INTRAMUSCULAR | Status: AC | PRN

## 2022-05-12 MED ORDER — ALPRAZOLAM 0.25 MG PO TABS: 0.2500 mg | ORAL_TABLET | Freq: Every day | ORAL | Status: DC | PRN

## 2022-05-12 MED ORDER — LOSARTAN POTASSIUM 25 MG PO TABS: 12.5000 mg | ORAL_TABLET | Freq: Every day | ORAL | Status: DC

## 2022-05-12 MED ORDER — SODIUM CHLORIDE 0.9 % IV SOLN: 250.0000 mL | INTRAVENOUS | Status: DC | PRN

## 2022-05-12 MED ORDER — MIDAZOLAM HCL 2 MG/2ML IJ SOLN
INTRAMUSCULAR | Status: DC | PRN
  Administered 2022-05-12: 1 mg via INTRAVENOUS
  Administered 2022-05-12: 2 mg via INTRAVENOUS

## 2022-05-12 MED ORDER — SODIUM CHLORIDE 0.9% FLUSH
3.0000 mL | Freq: Two times a day (BID) | INTRAVENOUS | Status: DC
  Administered 2022-05-12 – 2022-05-15 (×5): 3 mL via INTRAVENOUS

## 2022-05-12 MED ORDER — AMIODARONE HCL IN DEXTROSE 360-4.14 MG/200ML-% IV SOLN
30.0000 mg/h | INTRAVENOUS | Status: DC
  Administered 2022-05-12 (×2): 30 mg/h via INTRAVENOUS
  Filled 2022-05-12: qty 200

## 2022-05-12 MED ORDER — HEPARIN SODIUM (PORCINE) 5000 UNIT/ML IJ SOLN
5000.0000 [IU] | Freq: Three times a day (TID) | INTRAMUSCULAR | Status: DC
  Administered 2022-05-13 (×2): 5000 [IU] via SUBCUTANEOUS
  Filled 2022-05-12 (×2): qty 1

## 2022-05-12 MED ORDER — LIDOCAINE HCL (PF) 1 % IJ SOLN
INTRAMUSCULAR | Status: AC
  Filled 2022-05-12: qty 30

## 2022-05-12 MED ORDER — ONDANSETRON HCL 4 MG/2ML IJ SOLN: 4.0000 mg | Freq: Four times a day (QID) | INTRAMUSCULAR | Status: DC | PRN

## 2022-05-12 MED ORDER — SODIUM CHLORIDE 0.9 % IV SOLN: INTRAVENOUS | Status: AC

## 2022-05-12 MED ORDER — INSULIN DETEMIR 100 UNIT/ML ~~LOC~~ SOLN
5.0000 [IU] | SUBCUTANEOUS | Status: AC
  Administered 2022-05-12: 5 [IU] via SUBCUTANEOUS
  Filled 2022-05-12: qty 0.05

## 2022-05-12 MED ORDER — SODIUM CHLORIDE 0.9 % IV SOLN: INTRAVENOUS | Status: DC

## 2022-05-12 MED ORDER — AMIODARONE HCL IN DEXTROSE 360-4.14 MG/200ML-% IV SOLN
60.0000 mg/h | INTRAVENOUS | Status: DC
  Administered 2022-05-12 (×2): 60 mg/h via INTRAVENOUS
  Filled 2022-05-12 (×2): qty 200

## 2022-05-12 MED ORDER — ACETAMINOPHEN 325 MG PO TABS
650.0000 mg | ORAL_TABLET | ORAL | Status: DC | PRN
  Administered 2022-05-16 (×2): 650 mg via ORAL
  Filled 2022-05-12 (×2): qty 2

## 2022-05-12 MED ORDER — HEPARIN (PORCINE) IN NACL 1000-0.9 UT/500ML-% IV SOLN
INTRAVENOUS | Status: DC | PRN
  Administered 2022-05-12 (×2): 500 mL

## 2022-05-12 MED ORDER — SODIUM CHLORIDE 0.9% FLUSH
3.0000 mL | Freq: Two times a day (BID) | INTRAVENOUS | Status: DC
  Administered 2022-05-12 – 2022-05-15 (×6): 3 mL via INTRAVENOUS

## 2022-05-12 MED ORDER — AMIODARONE LOAD VIA INFUSION
150.0000 mg | Freq: Once | INTRAVENOUS | Status: AC
  Administered 2022-05-12: 150 mg via INTRAVENOUS
  Filled 2022-05-12: qty 83.34

## 2022-05-12 MED ORDER — INSULIN ASPART 100 UNIT/ML IJ SOLN
10.0000 [IU] | Freq: Three times a day (TID) | INTRAMUSCULAR | Status: DC
  Administered 2022-05-12 – 2022-05-14 (×6): 10 [IU] via SUBCUTANEOUS

## 2022-05-12 MED ORDER — FENTANYL CITRATE (PF) 100 MCG/2ML IJ SOLN
INTRAMUSCULAR | Status: AC
  Filled 2022-05-12: qty 2

## 2022-05-12 MED ORDER — IOHEXOL 350 MG/ML SOLN
INTRAVENOUS | Status: DC | PRN
  Administered 2022-05-12: 75 mL via INTRA_ARTERIAL

## 2022-05-12 MED ORDER — SPIRONOLACTONE 12.5 MG HALF TABLET: 12.5000 mg | ORAL_TABLET | Freq: Every day | ORAL | Status: DC

## 2022-05-12 MED ORDER — LIDOCAINE HCL (PF) 1 % IJ SOLN
INTRAMUSCULAR | Status: DC | PRN
  Administered 2022-05-12: 2 mL
  Administered 2022-05-12: 5 mL
  Administered 2022-05-12: 2 mL
  Administered 2022-05-12: 12 mL

## 2022-05-12 MED ORDER — VERAPAMIL HCL 2.5 MG/ML IV SOLN
INTRAVENOUS | Status: AC
  Filled 2022-05-12: qty 2

## 2022-05-12 MED ORDER — LABETALOL HCL 5 MG/ML IV SOLN: 10.0000 mg | INTRAVENOUS | Status: AC | PRN

## 2022-05-12 MED ORDER — FENTANYL CITRATE (PF) 100 MCG/2ML IJ SOLN
INTRAMUSCULAR | Status: DC | PRN
  Administered 2022-05-12 (×2): 25 ug via INTRAVENOUS

## 2022-05-12 MED ORDER — INSULIN DETEMIR 100 UNIT/ML ~~LOC~~ SOLN
20.0000 [IU] | Freq: Two times a day (BID) | SUBCUTANEOUS | Status: DC
  Administered 2022-05-12 – 2022-05-17 (×7): 20 [IU] via SUBCUTANEOUS
  Filled 2022-05-12 (×12): qty 0.2

## 2022-05-12 MED ORDER — HEPARIN SODIUM (PORCINE) 1000 UNIT/ML IJ SOLN
INTRAMUSCULAR | Status: AC
  Filled 2022-05-12: qty 10

## 2022-05-12 SURGICAL SUPPLY — 16 items
CATH INFINITI 5 FR AL2 (CATHETERS) IMPLANT
CATH INFINITI 5FR JL5 (CATHETERS) IMPLANT
CATH INFINITI 5FR MULTPACK ANG (CATHETERS) IMPLANT
CATH SWAN GANZ 7F STRAIGHT (CATHETERS) IMPLANT
CLOSURE MYNX CONTROL 5F (Vascular Products) IMPLANT
GLIDESHEATH SLEND A-KIT 6F 22G (SHEATH) IMPLANT
GLIDESHEATH SLENDER 7FR .021G (SHEATH) IMPLANT
GUIDEWIRE INQWIRE 1.5J.035X260 (WIRE) IMPLANT
INQWIRE 1.5J .035X260CM (WIRE)
KIT MICROPUNCTURE NIT STIFF (SHEATH) IMPLANT
PACK CARDIAC CATHETERIZATION (CUSTOM PROCEDURE TRAY) ×1 IMPLANT
SHEATH PINNACLE 5F 10CM (SHEATH) IMPLANT
SHEATH PINNACLE 7F 10CM (SHEATH) IMPLANT
SHEATH PROBE COVER 6X72 (BAG) IMPLANT
TRANSDUCER W/STOPCOCK (MISCELLANEOUS) ×1 IMPLANT
WIRE EMERALD 3MM-J .035X150CM (WIRE) IMPLANT

## 2022-05-12 NOTE — Progress Notes (Signed)
Responded to page. Patient went into VT and received shock from ICD. Patient was laying back in bed when event happened, no associated S&S.   EKG post event showed NSR w/ 1st degree AV block 70 bpm  HsTrop still pending, lab at bedside  EP notified.   No chest pain on assessment just tearful.   Started on IV amio gtt +bolus. Scheduled patient for [facility] later today.   Forestine Na, AGACNP-BC  Advanced Heart Failure Team

## 2022-05-12 NOTE — Progress Notes (Signed)
Remote ICD transmission.   

## 2022-05-12 NOTE — Interval H&P Note (Signed)
History and Physical Interval Note:  05/12/2022 3:05 PM  [name]  has presented today for surgery, with the diagnosis of chf.  The various methods of treatment have been discussed with the patient and family. After consideration of risks, benefits and other options for treatment, the patient has consented to  Procedure(s): RIGHT/LEFT HEART CATH AND CORONARY ANGIOGRAPHY (N/A) as a surgical intervention.  The patient's history has been reviewed, patient examined, no change in status, stable for surgery.  I have reviewed the patient's chart and labs.  Questions were answered to the patient's satisfaction.     Koral Thaden

## 2022-05-12 NOTE — H&P (View-Only) (Signed)
Advanced Heart Failure Rounding Note  PCP-Cardiologist: [name], MD   Subjective:    No further VT.  Labs not c/w low output. Lactic acid and LFTs ok. SCr improving 3.08>>2.86>>2  Echo 11/28 EF 30-35%, RV normal. The inferior vena cava is normal in size with greater than 50% respiratory variability, suggesting right atrial pressure of 3 mmHg. BNP 487.   Remains on Zosyn for buttock wound. GS now s/o. [place] yesterday showed: there is infiltrative subcutaneous edema. No abscess or drainable process observed.  Continues with polyarticular pain, left ankle and rt knee. Started on colchicine and prednisone.   Complaining of intt chest pain this morning, started last night. Denies SOB.    Objective:   Weight Range: 120.1 kg Body mass index is 35.9 kg/m.   Vital Signs:   Temp:  [97.6 F (36.4 C)-98.3 F (36.8 C)] 98 F (36.7 C) (12/04 0801) Pulse Rate:  [58-75] 75 (12/04 0801) Resp:  [15-22] 18 (12/04 0801) BP: (115-129)/(58-84) 123/58 (12/04 0305) SpO2:  [94 %-99 %] 95 % (12/04 0801) Weight:  [120.1 kg] 120.1 kg (12/04 0500) Last BM Date : 05/09/22  Weight change: Filed Weights   05/09/22 0500 05/11/22 0401 05/12/22 0500  Weight: 118.7 kg 119.7 kg 120.1 kg    Intake/Output:   Intake/Output Summary (Last 24 hours) at 05/12/2022 0831 Last data filed at 05/12/2022 0548 Gross per 24 hour  Intake 240 ml  Output 500 ml  Net -260 ml       Physical Exam    General:  well appearing.  No respiratory difficulty HEENT: normal Neck: supple. JVD difficult to access, thick neck, does not appear elevated. Carotids 2+ bilat; no bruits. No lymphadenopathy or thyromegaly appreciated. Cor: PMI nondisplaced. Regular rate & rhythm. No rubs, gallops or murmurs. Lungs: clear Abdomen: soft, nontender, nondistended. No hepatosplenomegaly. No bruits or masses. Good bowel sounds. Extremities: no cyanosis, clubbing, rash, edema. Sensitive to touch BLE Neuro: alert & oriented x 3,  cranial nerves grossly intact. moves all 4 extremities w/o difficulty. Affect pleasant.   Telemetry   NSR 60s 6-10 PVCs this am (Personally reviewed)    EKG    No new EKG to review   Labs    CBC Recent Labs    05/11/22 0051 05/12/22 0135  WBC 18.2* 15.2*  HGB 11.6* 11.5*  HCT 33.2* 33.8*  MCV 89.5 89.7  PLT 451* 506*    Basic Metabolic Panel Recent Labs    05/11/22 0051 05/11/22 1127 05/12/22 0135  NA 135  --  133*  K 5.2* 4.8 5.2*  CL 106  --  106  CO2 20*  --  20*  GLUCOSE 229*  --  327*  BUN 31*  --  41*  CREATININE 1.99*  --  2.00*  CALCIUM 8.4*  --  8.2*  MG 2.2  --  2.2    Liver Function Tests No results for input(s): "AST", "ALT", "ALKPHOS", "BILITOT", "PROT", "ALBUMIN" in the last 72 hours.  No results for input(s): "LIPASE", "AMYLASE" in the last 72 hours. Cardiac Enzymes No results for input(s): "CKTOTAL", "CKMB", "CKMBINDEX", "TROPONINI" in the last 72 hours.  BNP: BNP (last 3 results) Recent Labs    02/01/22 1702 05/03/22 2010 05/06/22 1736  BNP 11.8 350.8* 487.7*     ProBNP (last 3 results) Recent Labs    01/09/22 1002 03/21/22 1452  PROBNP 97 18.0      D-Dimer No results for input(s): "DDIMER" in the last 72 hours. Hemoglobin A1C  No results for input(s): "HGBA1C" in the last 72 hours. Fasting Lipid Panel No results for input(s): "CHOL", "HDL", "LDLCALC", "TRIG", "CHOLHDL", "LDLDIRECT" in the last 72 hours. Thyroid Function Tests No results for input(s): "TSH", "T4TOTAL", "T3FREE", "THYROIDAB" in the last 72 hours.  Invalid input(s): "FREET3"   Other results:   Imaging    No results found.   Medications:     Scheduled Medications:  aspirin EC  81 mg Oral Daily   atorvastatin  10 mg Oral Daily   colchicine  0.3 mg Oral BID   fiber supplement (BANATROL TF)  60 mL Oral BID   heparin  5,000 Units Subcutaneous Q8H   insulin aspart  0-9 Units Subcutaneous TID WC   insulin aspart  6 Units Subcutaneous TID WC    insulin detemir  17 Units Subcutaneous BID   [START ON 05/13/2022] losartan  12.5 mg Oral Daily   predniSONE  40 mg Oral BID WC   sertraline  12.5 mg Oral Daily   [START ON 05/13/2022] spironolactone  12.5 mg Oral Daily   tamsulosin  0.4 mg Oral Daily    Infusions:  piperacillin-tazobactam (ZOSYN)  IV 3.375 g (05/12/22 0553)    PRN Medications: acetaminophen, Gerhardt's butt cream, HYDROmorphone, levalbuterol, [COMPLETED] loperamide **FOLLOWED BY** loperamide, mouth rinse, oxyCODONE    Patient Profile   56 y/o AAM w/ h/o chronic systolic heart failure due to NICM, s/p ICD, admitted w/ VT storm treated w/ multiple ICD shocks w/ witnessed VT arrest while in ED treated w/ CPR and defib, all in setting of metabolic derangement/hypokalemia from GI loses. AHF team consulted for assistance w/ med management in setting of worsening renal function and soft BP, concerning for potential low output HF   Assessment/Plan   1. VT Storm - had 22 ICD shocks and 2 ATP, 10 aborted shocks - in setting of hypokalemia from GI loses, initial K 2.8 (suspected etiology) - loaded w/ IV amiodarone, K corrected - VT has been quiescent since 11/25 w/ repletion of K.  - Amio stopped 11/28. No further VT  - Echo stable, EF 30-35%  - doubt ischemic component. Normal cath in 2015. No CP. + mild HS trop elevation but flat trend, most c/w demand ischemia    2. Chronic Systolic Heart Failure  - NICM - Echo 07/2013 EF 20-25%. RV normal>> LHC normal cors. - Repeat Echo 5/15 EF 30-35% - s/p ICD 7/15 - Echo 8/22, EF 30%, RV normal - Echo this admit stable, EF 30-35%, IVC not dilated  - labs not c/w low output, Lactic acid and LFTs nl  - Around baseline Cr. Holding spiro w/ K 5.2 - no SGLT2 w/ perineal infection    3. AKI on Stage IIIb CKD - b/l SCr ~2.0 - elevated this admit, ~3.0, 2 today (around baseline) - suspect due to dehydration from GI losses  - follow BMP closely.      4. Right gluteal cellulitis and  wound - CT scan with no evidence for abscess or drainable fluid collection - GS following, US done  - c/w abx, on Zosyn    5. Type DM - insulin per primary team   6. New Polyarticular Pain  - left ankle and rt knee - Uric acid 10 and plain films showed: Possible 8 mm intra-articular loose body. This could be confirmed with MRI examination if indicated. No fracture or dislocation. - Suspected gout, colchicine started - On Prednisone per primary  7. Hyperkalemia - K 5.2, will hold spiro today.  -  restart 12/5  8. Atypical chest Pain - complaining of intt chest pain this morning, not reproducible. Started last night.  - EKG done at bedside showed SB in 47s.  - K 5.2, holding spiro - HsTrop pending - 68' LHC demonstrating normal coronary anatomy  - May benefit from [facility] if symptoms worsen or persist   Length of Stay: [place], NP  05/12/2022, 8:31 AM  Advanced Heart Failure Team Pager [phone] (M-F; 7a - 5p)  Please contact [facility] for night-coverage after hours (5p -7a ) and weekends on [email or web address]

## 2022-05-12 NOTE — TOC Transition Note (Signed)
Pt was not discharged over the week-end. Discharge meds are now in TOC.  Garnet Sierras, PharmD

## 2022-05-12 NOTE — Progress Notes (Signed)
Electrophysiology Rounding Note  Patient Name: [name] Date of Encounter: 05/12/2022  Primary Cardiologist: [name], MD Electrophysiologist: Thompson Grayer, MD   Subjective   Pt had recurrent VT this am despite electrolytes WNL,somewhat elevated.   Feeling very overwhelmed and upset currently. Was hoping to go home today. Having intermittent dizziness and SOB, not correlating with tele (since shock)  Inpatient Medications    Scheduled Meds:  amiodarone  150 mg Intravenous Once   aspirin EC  81 mg Oral Daily   atorvastatin  10 mg Oral Daily   colchicine  0.3 mg Oral BID   fiber supplement (BANATROL TF)  60 mL Oral BID   heparin  5,000 Units Subcutaneous Q8H   insulin aspart  0-9 Units Subcutaneous TID WC   insulin aspart  10 Units Subcutaneous TID WC   insulin detemir  20 Units Subcutaneous BID   insulin detemir  5 Units Subcutaneous STAT   [START ON 05/13/2022] losartan  12.5 mg Oral Daily   predniSONE  40 mg Oral BID WC   sodium chloride flush  3 mL Intravenous Q12H   tamsulosin  0.4 mg Oral Daily   Continuous Infusions:  sodium chloride     [START ON 05/13/2022] sodium chloride     amiodarone 60 mg/hr (05/12/22 1031)   Followed by   amiodarone     piperacillin-tazobactam (ZOSYN)  IV 3.375 g (05/12/22 0553)   PRN Meds: sodium chloride, acetaminophen, ALPRAZolam, Gerhardt's butt cream, HYDROmorphone, levalbuterol, [COMPLETED] loperamide **FOLLOWED BY** loperamide, mouth rinse, oxyCODONE, sodium chloride flush   Vital Signs    Vitals:   05/12/22 0305 05/12/22 0500 05/12/22 0801 05/12/22 0832  BP: (!) 123/58   99/63  Pulse: (!) 59  75 77  Resp: 15  18 20   Temp: 98 F (36.7 C)  98 F (36.7 C)   TempSrc: Oral  Oral   SpO2: 95%  95% 91%  Weight:  120.1 kg    Height:        Intake/Output Summary (Last 24 hours) at 05/12/2022 1037 Last data filed at 05/12/2022 0548 Gross per 24 hour  Intake 240 ml  Output 500 ml  Net -260 ml   Filed Weights    05/09/22 0500 05/11/22 0401 05/12/22 0500  Weight: 118.7 kg 119.7 kg 120.1 kg    Physical Exam    GEN- The patient is tearful appearing, alert and oriented x 3 today.   HEENT- No gross abnormality.  Lungs- Clear to ausculation bilaterally, normal work of breathing Heart- Regular rate and rhythm, no murmurs, rubs or gallops GI- soft, NT, ND, + BS Extremities- no clubbing or cyanosis. No edema Neuro- No obvious focal abnormality.   Labs    CBC Recent Labs    05/11/22 0051 05/12/22 0135  WBC 18.2* 15.2*  HGB 11.6* 11.5*  HCT 33.2* 33.8*  MCV 89.5 89.7  PLT 451* 371*   Basic Metabolic Panel Recent Labs    05/11/22 0051 05/11/22 1127 05/12/22 0135  NA 135  --  133*  K 5.2* 4.8 5.2*  CL 106  --  106  CO2 20*  --  20*  GLUCOSE 229*  --  327*  BUN 31*  --  41*  CREATININE 1.99*  --  2.00*  CALCIUM 8.4*  --  8.2*  MG 2.2  --  2.2   Liver Function Tests No results for input(s): "AST", "ALT", "ALKPHOS", "BILITOT", "PROT", "ALBUMIN" in the last 72 hours. No results for input(s): "LIPASE", "AMYLASE" in  the last 72 hours. Cardiac Enzymes No results for input(s): "CKTOTAL", "CKMB", "CKMBINDEX", "TROPONINI" in the last 72 hours.   Telemetry    Telemetry shows SB/NSR 50-70s mostly. Increased ectopy and NSVT overnight, This am had VT/VF terminated by his ICD. V pacing elicited NSVT on many occasions, and also appears to have prompted his VF. (personally reviewed)  Radiology    No results found.  Patient Profile     [name] is a 56 y.o. male with a hx of NICM/chronic systolic CHF s/p ICD, normal coronary arteries by cath 2015, GERD, HTN and DM2 who is being seen today for the evaluation of ventricular tachycardia/Vfib with multiple ICD shocks at the request of Dr. Gwenlyn Found.     Assessment & Plan    1.  VT storm Pt had 22 ICD shocks and 2 ATP, 10 aborted shocks on initial arrival. Felt to be likely in setting of hypokalemia and infection, but now recurred despite  management of both.  Potassium5.2* (12/04 0135) Magnesium  2.2 (12/04 0135) Creatinine, ser  2.00* (12/04 0135) Keep K > 4.0 and Mg > 2.0  Pt has been started on amiodarone and has renal disease, so likely only best option is continuing amiodarone.  Could potentially continue to manage his acute issues and attempt to wean to mexiletine as outpatient given his young age.  His PMVT would not likely be amenable to ablation. VT/VF this am appears prompted by V pacing, as well as multiple episodes of NSVT.  Reviewed episodes from admission, which were NOT related to V pacing.   2. Chronic systolic CHF 3. NICM Echo 01/2021 LVEF 30% -> 05/06/2022 LVEF 30-35% Home GDMT on hold with diarrhea and AKI HF team following and plan for [facility] today. Historically has been NICM   4. Diarrhea 5. Right gluteal cellulitis and wound Per primary Gen surgery following. On IV ABx   5. AKI on CKD III Baseline Cr 1.9-2.1 Cr 2.0 today.   For questions or updates, please contact [place] Please consult [email or web address] for contact info under Cardiology/STEMI.  Signed, Shirley Friar, PA-C  05/12/2022, 10:37 AM

## 2022-05-12 NOTE — Progress Notes (Signed)
PROGRESS NOTE [name]  IHK:[id number] DOB: [birth year] DOA: 05/03/2022 PCP: [name], MD   Brief Narrative/Hospital Course: 56 year old male with history of HFrEF and ICM status post ICD placement, type 2 diabetes, chronic low back pain with a spondylosis and spinal stenosis, obesity BMI 35, CKD 3 baseline creatinine 1.8-2.3, GERD brought to the ED for diarrhea after recently increasing his Trulicity on 75/64 having increasing diarrhea, repeated LE with ICD shock on 11/25 unable to take p.o. on 11/25 EMS called brought to the ED. In the ED had witnessed V. tach arrest underwent CPR with ROSC with 1 shock return to baseline neurological status lab with potassium 2.8 creatinine 2.85 baseline 1.8-2.3, leukocytosis 19.5 low-grade fever also hypotension, currently consulted admitted to ICU Hypokalemia stabilized with aggressive replacement.Found to have new buttock abscess/cellulitis and surgery consulted.Patient transferred to [facility] service 11/30. Surgery evaluated ultrasound obtained on drainable fluid collection and signed off. Patient continued to have poorly arthralgias seen by Hilbert Odor from orthopedics, with elevated uric acid and symptomatology consistent with gout but did not improve on colchicine changed to prednisone 12/1   Subjective: Seen this am and again seen after he had Shock form his ICD for VT and appears tearful and overwhelmed C/o chest pain earlier Swelling on both ankle some better painful left hand and b/l ankle some better but pain more on heels- has plantar fascitis history Overnight afebrile BP stable/soft  Labs reviewed potassium continues to remain elevated 5.2> moved losartan/Aldactone for 12/5- repeat labs ordered for 12/4 noon Mag is >2  Assessment and Plan:  V. tach  Storm: On admissiondue to hypokalemia, again recurrent VT stem despite stable electrolytes.  EP cardiology following on board monitor in telemetry, VT VF this a.m. appears prompted V  pacing as well as multiple episodes of nonsustained V. tach.  Starting amiodarone drip and for cardiac cath later today  Chronic systolic CHF-EF 33-29% this admission similar to previous NICM w ICD in place: Chest pain: Currently euvolemic.  Seen by cardiology. Holding off on diuretic due to his diarrhea.  Cardiology to consider GDMT: We will hold Aldactone losartan today due to hyperkalemia. Cont Aspirin Lipitor.  Monitor intake output and daily weight as below. Net IO Since Admission: 3,273.5 mL [05/12/22 1043]  Cardiology planning for cardiac cath today, troponin pending  Hypokalemia: Due to diarrhea,resolved after aggressive replacement.  Similar remains of likely contributed by hyperglycemia along with her left renal [place].  Hold Aldactone losartan today.  Repeat labs.keep goal of more than 4 given his VT Recent Labs  Lab 05/08/22 0229 05/09/22 0149 05/10/22 0100 05/11/22 0051 05/11/22 1127 05/12/22 0135  K 3.3* 3.5 4.7 5.2* 4.8 5.2*  CALCIUM 8.5* 8.4* 8.6* 8.4*  --  8.2*  MG 2.5* 2.2 2.1 2.2  --  2.2  Hypotension suspected hypovolemic shock due to diarrhea.  Resolved  Diarrhea suspected due to increase in Trulicity dose: upto 20 episodes of diarrhea per day, Trulicity discontinued> C. Difficile and GI penl negative.  Diarrhea is improved. Continue supportive care with Imodium,Banatrol.  Type 2 diabetes mellitus with uncontrolled hyperglycemia: At home on Trulicity, Farxiga, NovoLog FlexPen 10 units twice daily, Lantus 30 units daily.Trulicity discontinued due to side effects. Blood sugar is poorly controlled due to steroid hopefully can decrease steroid and joint pain improving > increased Levemir to 20 BID this am, cont premeal novolog 10 u tid,cont ssi 0-9 u. HBa1c 10.2 indicating poor outpatient control.  DM coordinator consulted Recent Labs  Lab 05/11/22 0543 05/11/22 1119  05/11/22 1632 05/11/22 2105 05/12/22 0544  GLUCAP 236* 163* 250* 283* 246*   Cellulitis of the  buttock: ultrasound soft tissue 11/30- shows region of concern along the right buttock infiltrative subcutaneous edema no abscess or drainable process observed. CT scan 11/27 no evidence of abscess or drainable fluid collection.  Surgery signed off, no need for procedure.  Empiric Zosyn will be continued, continue wound care.  Patient w/ bump in leukocytes but after prednisone use.   Leukocytosis: WBC elevated 13-15 K> peaked at 20.2 now downtrending suspect in the setting of prednisone use, and also contributed by polyarthritis/gout, on Zosyn for cellulitis of the buttock.  Overall WBC count improving Recent Labs  Lab 05/08/22 0229 05/09/22 0149 05/10/22 0100 05/11/22 0051 05/12/22 0135  WBC 15.0* 16.5* 20.2* 18.2* 15.2*   Polyarthritis  Right knee pain-better Left foot/right foot left hand swelling and pain History of plantar fasciitis; Complains of pain mostly on his heels also mild ankle pain and swelling, somewhat improvement prednisone continue 40 mg twice daily hopefully we can cut down the dose given uncontrolled hyperglycemia, did not improve on colchicine alone-Seen by Silvestre Gunner from orthopedics and discussed with him about starting prednisone  12/1.Previously seen by Dr. Lynann Bologna -outpatient for plantar fasciitis of left foot.  AKI on CKD IIIb: b/l creatinine close to 2.3 back in August.  Improved and stable Recent Labs    05/04/22 2210 05/05/22 0635 05/05/22 1533 05/06/22 1645 05/07/22 0535 05/08/22 0229 05/09/22 0149 05/10/22 0100 05/11/22 0051 05/12/22 0135  BUN 40* 43* 45* 39* 37* 30* 25* 25* 31* 41*  CREATININE 2.97* 3.06* 3.08* 2.86* 2.74* 2.51* 2.02* 2.10* 1.99* 2.00*   Hyponatremia: Improved  Class II Obesity:Patient's Body mass index is 35.9 kg/m. : Will benefit with PCP follow-up, weight loss  healthy lifestyle and outpatient sleep evaluation.  DVT prophylaxis: heparin injection 5,000 Units Start: 05/03/22 2200 SCDs Start: 05/03/22 2111 Code  Status:   Code Status: Full Code Family Communication: plan of care discussed with patient at bedside.  Patient status is: inpatient because of hypokalemia Level of care: Telemetry Cardiac  Dispo: The patient is from: home  with his brother            Anticipated disposition: Home in next 24 hours  Objective: Vitals last 24 hrs: Vitals:   05/12/22 0305 05/12/22 0500 05/12/22 0801 05/12/22 0832  BP: (!) 123/58   99/63  Pulse: (!) 59  75 77  Resp: 15  18 20   Temp: 98 F (36.7 C)  98 F (36.7 C)   TempSrc: Oral  Oral   SpO2: 95%  95% 91%  Weight:  120.1 kg    Height:       Weight change: 0.318 kg  Physical Examination: General exam: AA, tearful and anxious form shock HEENT:Oral mucosa moist, Ear/Nose WNL grossly, dentition normal. Respiratory system: bilaterally clear BS, no use of accessory muscle Cardiovascular system: S1 & S2 +, regular rate, JVD neg. Gastrointestinal system: Abdomen soft, NT,ND,BS+ Nervous System:Alert, awake, moving extremities and grossly nonfocal Extremities: LE ankle edema mild, tender ankles but mostly on heels Skin: No rashes,no icterus. MSK: Normal muscle bulk,tone, power    Medications reviewed:  Scheduled Meds:  aspirin EC  81 mg Oral Daily   atorvastatin  10 mg Oral Daily   colchicine  0.3 mg Oral BID   fiber supplement (BANATROL TF)  60 mL Oral BID   heparin  5,000 Units Subcutaneous Q8H   insulin aspart  0-9 Units Subcutaneous TID WC  insulin aspart  10 Units Subcutaneous TID WC   insulin detemir  20 Units Subcutaneous BID   insulin detemir  5 Units Subcutaneous STAT   [START ON 05/13/2022] losartan  12.5 mg Oral Daily   predniSONE  40 mg Oral BID WC   sodium chloride flush  3 mL Intravenous Q12H   tamsulosin  0.4 mg Oral Daily   Continuous Infusions:  sodium chloride     [START ON 05/13/2022] sodium chloride     amiodarone 60 mg/hr (05/12/22 1031)   Followed by   amiodarone     piperacillin-tazobactam (ZOSYN)  IV 3.375 g  (05/12/22 0553)    Diet Order             Diet NPO time specified  Diet effective now                  Intake/Output Summary (Last 24 hours) at 05/12/2022 1043 Last data filed at 05/12/2022 0548 Gross per 24 hour  Intake 240 ml  Output 500 ml  Net -260 ml   Net IO Since Admission: 3,273.5 mL [05/12/22 1043]  Wt Readings from Last 3 Encounters:  05/12/22 120.1 kg  04/04/22 118 kg  03/21/22 117.5 kg     Unresulted Labs (From admission, onward)     Start     Ordered   05/12/22 1100  Potassium  Once,   R       Question:  Specimen collection method  Answer:  Lab=Lab collect   05/12/22 0603   05/10/22 0500  CBC  Daily at 5am,   R     Question:  Specimen collection method  Answer:  Lab=Lab collect   05/09/22 1649   05/10/22 1941  Basic metabolic panel  Daily at 5am,   R     Question:  Specimen collection method  Answer:  Lab=Lab collect   05/09/22 1649   05/09/22 0500  CBC  Daily at 5am,   R     Question:  Specimen collection method  Answer:  Lab=Lab collect   05/08/22 0929   05/06/22 0500  Magnesium  Daily at 5am,   R     Question:  Specimen collection method  Answer:  Lab=Lab collect   05/05/22 0757          Data Reviewed: I have personally reviewed following labs and imaging studies CBC: Recent Labs  Lab 05/08/22 0229 05/09/22 0149 05/10/22 0100 05/11/22 0051 05/12/22 0135  WBC 15.0* 16.5* 20.2* 18.2* 15.2*  HGB 11.0* 10.8* 11.1* 11.6* 11.5*  HCT 31.9* 32.5* 33.4* 33.2* 33.8*  MCV 89.1 91.0 90.5 89.5 89.7  PLT 286 334 401* 451* 740*   Basic Metabolic Panel: Recent Labs  Lab 05/08/22 0229 05/09/22 0149 05/10/22 0100 05/11/22 0051 05/11/22 1127 05/12/22 0135  NA 136 135 135 135  --  133*  K 3.3* 3.5 4.7 5.2* 4.8 5.2*  CL 104 102 105 106  --  106  CO2 22 21* 19* 20*  --  20*  GLUCOSE 134* 173* 235* 229*  --  327*  BUN 30* 25* 25* 31*  --  41*  CREATININE 2.51* 2.02* 2.10* 1.99*  --  2.00*  CALCIUM 8.5* 8.4* 8.6* 8.4*  --  8.2*  MG 2.5* 2.2 2.1  2.2  --  2.2   GFR: Estimated Creatinine Clearance: 54.5 mL/min (A) (by C-G formula based on SCr of 2 mg/dL (H)). Liver Function Tests: Recent Labs  Lab 05/06/22 1645  AST 40  ALT 30  ALKPHOS 62  BILITOT 1.0  PROT 6.7  ALBUMIN 2.5*   Recent Labs    01/09/22 1002 03/21/22 1452  PROBNP 97 18.0   Recent Results (from the past 240 hour(s))  MRSA Next Gen by PCR, Nasal     Status: None   Collection Time: 05/03/22 10:48 PM   Specimen: Nasal Mucosa; Nasal Swab  Result Value Ref Range Status   MRSA by PCR Next Gen NOT DETECTED NOT DETECTED Final    Comment: (NOTE) The GeneXpert MRSA Assay (FDA approved for NASAL specimens only), is one component of a comprehensive MRSA colonization surveillance program. It is not intended to diagnose MRSA infection nor to guide or monitor treatment for MRSA infections. Test performance is not FDA approved in patients less than 49 years old. Performed at [facility], [place] [address], [place], [place] [address]   C Difficile Quick Screen w PCR reflex     Status: None   Collection Time: 05/05/22  8:35 PM   Specimen: Stool  Result Value Ref Range Status   C Diff antigen NEGATIVE NEGATIVE Final   C Diff toxin NEGATIVE NEGATIVE Final   C Diff interpretation No C. difficile detected.  Final    Comment: Performed at [facility], [place] [address], [place], [place] [address]  Gastrointestinal Panel by PCR , Stool     Status: None   Collection Time: 05/06/22  9:53 PM   Specimen: Stool  Result Value Ref Range Status   Campylobacter species NOT DETECTED NOT DETECTED Final   Plesimonas shigelloides NOT DETECTED NOT DETECTED Final   Salmonella species NOT DETECTED NOT DETECTED Final   Yersinia enterocolitica NOT DETECTED NOT DETECTED Final   Vibrio species NOT DETECTED NOT DETECTED Final   Vibrio cholerae NOT DETECTED NOT DETECTED Final   Enteroaggregative E coli (EAEC) NOT DETECTED NOT DETECTED Final   Enteropathogenic E coli (EPEC) NOT  DETECTED NOT DETECTED Final   Enterotoxigenic E coli (ETEC) NOT DETECTED NOT DETECTED Final   Shiga like toxin producing E coli (STEC) NOT DETECTED NOT DETECTED Final   Shigella/Enteroinvasive E coli (EIEC) NOT DETECTED NOT DETECTED Final   Cryptosporidium NOT DETECTED NOT DETECTED Final   Cyclospora cayetanensis NOT DETECTED NOT DETECTED Final   Entamoeba histolytica NOT DETECTED NOT DETECTED Final   Giardia lamblia NOT DETECTED NOT DETECTED Final   Adenovirus F40/41 NOT DETECTED NOT DETECTED Final   Astrovirus NOT DETECTED NOT DETECTED Final   Norovirus GI/GII NOT DETECTED NOT DETECTED Final   Rotavirus A NOT DETECTED NOT DETECTED Final   Sapovirus (I, II, IV, and V) NOT DETECTED NOT DETECTED Final    Comment: Performed at [facility], [place] [place], [place] [address]    Antimicrobials: Anti-infectives (From admission, onward)    Start     Dose/Rate Route Frequency Ordered Stop   05/05/22 1400  piperacillin-tazobactam (ZOSYN) IVPB 3.375 g        3.375 g 12.5 mL/hr over 240 Minutes Intravenous Every 8 hours 05/05/22 1158     05/05/22 1030  amoxicillin-clavulanate (AUGMENTIN) 875-125 MG per tablet 1 tablet  Status:  Discontinued        1 tablet Oral Every 12 hours 05/05/22 0936 05/05/22 1158      Culture/Microbiology    Component Value Date/Time   SDES  09/18/2017 0040    BLOOD LEFT HAND Performed at [facility], [address] [place], [place] [address]    [facility]  09/18/2017 0040  BOTTLES DRAWN AEROBIC AND ANAEROBIC Blood Culture adequate volume Performed at [facility], [address], [place], [place] [address]    CULT  09/18/2017 0040    NO GROWTH 5 DAYS Performed at [facility], [place] [address] [place], [place] [address]    REPTSTATUS 09/23/2017 FINAL 09/18/2017 0040  Radiology Studies: No results found.   LOS: 9 days   Antonieta Pert, MD [facility]  05/12/2022, 10:43 AM

## 2022-05-12 NOTE — Progress Notes (Signed)
Advanced Heart Failure Rounding Note  PCP-Cardiologist: [name], MD   Subjective:    No further VT.  Labs not c/w low output. Lactic acid and LFTs ok. SCr improving 3.08>>2.86>>2  Echo 11/28 EF 30-35%, RV normal. The inferior vena cava is normal in size with greater than 50% respiratory variability, suggesting right atrial pressure of 3 mmHg. BNP 487.   Remains on Zosyn for buttock wound. GS now s/o. [place] yesterday showed: there is infiltrative subcutaneous edema. No abscess or drainable process observed.  Continues with polyarticular pain, left ankle and rt knee. Started on colchicine and prednisone.   Complaining of intt chest pain this morning, started last night. Denies SOB.    Objective:   Weight Range: 120.1 kg Body mass index is 35.9 kg/m.   Vital Signs:   Temp:  [97.6 F (36.4 C)-98.3 F (36.8 C)] 98 F (36.7 C) (12/04 0801) Pulse Rate:  [58-75] 75 (12/04 0801) Resp:  [15-22] 18 (12/04 0801) BP: (115-129)/(58-84) 123/58 (12/04 0305) SpO2:  [94 %-99 %] 95 % (12/04 0801) Weight:  [120.1 kg] 120.1 kg (12/04 0500) Last BM Date : 05/09/22  Weight change: Filed Weights   05/09/22 0500 05/11/22 0401 05/12/22 0500  Weight: 118.7 kg 119.7 kg 120.1 kg    Intake/Output:   Intake/Output Summary (Last 24 hours) at 05/12/2022 0831 Last data filed at 05/12/2022 0548 Gross per 24 hour  Intake 240 ml  Output 500 ml  Net -260 ml       Physical Exam    General:  well appearing.  No respiratory difficulty HEENT: normal Neck: supple. JVD difficult to access, thick neck, does not appear elevated. Carotids 2+ bilat; no bruits. No lymphadenopathy or thyromegaly appreciated. Cor: PMI nondisplaced. Regular rate & rhythm. No rubs, gallops or murmurs. Lungs: clear Abdomen: soft, nontender, nondistended. No hepatosplenomegaly. No bruits or masses. Good bowel sounds. Extremities: no cyanosis, clubbing, rash, edema. Sensitive to touch BLE Neuro: alert & oriented x 3,  cranial nerves grossly intact. moves all 4 extremities w/o difficulty. Affect pleasant.   Telemetry   NSR 60s 6-10 PVCs this am (Personally reviewed)    EKG    No new EKG to review   Labs    CBC Recent Labs    05/11/22 0051 05/12/22 0135  WBC 18.2* 15.2*  HGB 11.6* 11.5*  HCT 33.2* 33.8*  MCV 89.5 89.7  PLT 451* 506*    Basic Metabolic Panel Recent Labs    05/11/22 0051 05/11/22 1127 05/12/22 0135  NA 135  --  133*  K 5.2* 4.8 5.2*  CL 106  --  106  CO2 20*  --  20*  GLUCOSE 229*  --  327*  BUN 31*  --  41*  CREATININE 1.99*  --  2.00*  CALCIUM 8.4*  --  8.2*  MG 2.2  --  2.2    Liver Function Tests No results for input(s): "AST", "ALT", "ALKPHOS", "BILITOT", "PROT", "ALBUMIN" in the last 72 hours.  No results for input(s): "LIPASE", "AMYLASE" in the last 72 hours. Cardiac Enzymes No results for input(s): "CKTOTAL", "CKMB", "CKMBINDEX", "TROPONINI" in the last 72 hours.  BNP: BNP (last 3 results) Recent Labs    02/01/22 1702 05/03/22 2010 05/06/22 1736  BNP 11.8 350.8* 487.7*     ProBNP (last 3 results) Recent Labs    01/09/22 1002 03/21/22 1452  PROBNP 97 18.0      D-Dimer No results for input(s): "DDIMER" in the last 72 hours. Hemoglobin A1C  No results for input(s): "HGBA1C" in the last 72 hours. Fasting Lipid Panel No results for input(s): "CHOL", "HDL", "LDLCALC", "TRIG", "CHOLHDL", "LDLDIRECT" in the last 72 hours. Thyroid Function Tests No results for input(s): "TSH", "T4TOTAL", "T3FREE", "THYROIDAB" in the last 72 hours.  Invalid input(s): "FREET3"   Other results:   Imaging    No results found.   Medications:     Scheduled Medications:  aspirin EC  81 mg Oral Daily   atorvastatin  10 mg Oral Daily   colchicine  0.3 mg Oral BID   fiber supplement (BANATROL TF)  60 mL Oral BID   heparin  5,000 Units Subcutaneous Q8H   insulin aspart  0-9 Units Subcutaneous TID WC   insulin aspart  6 Units Subcutaneous TID WC    insulin detemir  17 Units Subcutaneous BID   [START ON 05/13/2022] losartan  12.5 mg Oral Daily   predniSONE  40 mg Oral BID WC   sertraline  12.5 mg Oral Daily   [START ON 05/13/2022] spironolactone  12.5 mg Oral Daily   tamsulosin  0.4 mg Oral Daily    Infusions:  piperacillin-tazobactam (ZOSYN)  IV 3.375 g (05/12/22 0553)    PRN Medications: acetaminophen, Gerhardt's butt cream, HYDROmorphone, levalbuterol, [COMPLETED] loperamide **FOLLOWED BY** loperamide, mouth rinse, oxyCODONE    Patient Profile   57 y/o AAM w/ h/o chronic systolic heart failure due to NICM, s/p ICD, admitted w/ VT storm treated w/ multiple ICD shocks w/ witnessed VT arrest while in ED treated w/ CPR and defib, all in setting of metabolic derangement/hypokalemia from GI loses. AHF team consulted for assistance w/ med management in setting of worsening renal function and soft BP, concerning for potential low output HF   Assessment/Plan   1. VT Storm - had 22 ICD shocks and 2 ATP, 10 aborted shocks - in setting of hypokalemia from GI loses, initial K 2.8 (suspected etiology) - loaded w/ IV amiodarone, K corrected - VT has been quiescent since 11/25 w/ repletion of K.  - Amio stopped 11/28. No further VT  - Echo stable, EF 30-35%  - doubt ischemic component. Normal cath in 2015. No CP. + mild HS trop elevation but flat trend, most c/w demand ischemia    2. Chronic Systolic Heart Failure  - NICM - Echo 07/2013 EF 20-25%. RV normal>> LHC normal cors. - Repeat Echo 5/15 EF 30-35% - s/p ICD 7/15 - Echo 8/22, EF 30%, RV normal - Echo this admit stable, EF 30-35%, IVC not dilated  - labs not c/w low output, Lactic acid and LFTs nl  - Around baseline Cr. Holding spiro w/ K 5.2 - no SGLT2 w/ perineal infection    3. AKI on Stage IIIb CKD - b/l SCr ~2.0 - elevated this admit, ~3.0, 2 today (around baseline) - suspect due to dehydration from GI losses  - follow BMP closely.      4. Right gluteal cellulitis and  wound - CT scan with no evidence for abscess or drainable fluid collection - GS following, US done  - c/w abx, on Zosyn    5. Type DM - insulin per primary team   6. New Polyarticular Pain  - left ankle and rt knee - Uric acid 10 and plain films showed: Possible 8 mm intra-articular loose body. This could be confirmed with MRI examination if indicated. No fracture or dislocation. - Suspected gout, colchicine started - On Prednisone per primary  7. Hyperkalemia - K 5.2, will hold spiro today.  -  restart 12/5  8. Atypical chest Pain - complaining of intt chest pain this morning, not reproducible. Started last night.  - EKG done at bedside showed SB in 60s.  - K 5.2, holding spiro - HsTrop pending - 78' LHC demonstrating normal coronary anatomy  - May benefit from [facility] if symptoms worsen or persist   Length of Stay: [place], NP  05/12/2022, 8:31 AM  Advanced Heart Failure Team Pager [phone] (M-F; 7a - 5p)  Please contact [facility] for night-coverage after hours (5p -7a ) and weekends on [email or web address]

## 2022-05-13 ENCOUNTER — Other Ambulatory Visit (HOSPITAL_COMMUNITY): Payer: Self-pay

## 2022-05-13 ENCOUNTER — Encounter (HOSPITAL_COMMUNITY): Payer: Self-pay | Admitting: Cardiology

## 2022-05-13 DIAGNOSIS — I469 Cardiac arrest, cause unspecified: Secondary | ICD-10-CM | POA: Diagnosis not present

## 2022-05-13 LAB — BASIC METABOLIC PANEL
Anion gap: 7 (ref 5–15)
BUN: 36 mg/dL — ABNORMAL HIGH (ref 6–20)
CO2: 18 mmol/L — ABNORMAL LOW (ref 22–32)
Calcium: 8.1 mg/dL — ABNORMAL LOW (ref 8.9–10.3)
Chloride: 109 mmol/L (ref 98–111)
Creatinine, Ser: 1.79 mg/dL — ABNORMAL HIGH (ref 0.61–1.24)
GFR, Estimated: 44 mL/min — ABNORMAL LOW (ref >60)
Glucose, Bld: 269 mg/dL — ABNORMAL HIGH (ref 70–99)
Potassium: 5 mmol/L (ref 3.5–5.1)
Sodium: 134 mmol/L — ABNORMAL LOW (ref 135–145)

## 2022-05-13 LAB — CBC
HCT: 35.2 % — ABNORMAL LOW (ref 39.0–52.0)
Hemoglobin: 12.1 g/dL — ABNORMAL LOW (ref 13.0–17.0)
MCH: 30.7 pg (ref 26.0–34.0)
MCHC: 34.4 g/dL (ref 30.0–36.0)
MCV: 89.3 fL (ref 80.0–100.0)
Platelets: 540 10*3/uL — ABNORMAL HIGH (ref 150–400)
RBC: 3.94 MIL/uL — ABNORMAL LOW (ref 4.22–5.81)
RDW: 14.9 % (ref 11.5–15.5)
WBC: 11.7 10*3/uL — ABNORMAL HIGH (ref 4.0–10.5)
nRBC: 0 % (ref 0.0–0.2)

## 2022-05-13 LAB — GLUCOSE, CAPILLARY
Glucose-Capillary: 193 mg/dL — ABNORMAL HIGH (ref 70–99)
Glucose-Capillary: 128 mg/dL — ABNORMAL HIGH (ref 70–99)
Glucose-Capillary: 235 mg/dL — ABNORMAL HIGH (ref 70–99)
Glucose-Capillary: 226 mg/dL — ABNORMAL HIGH (ref 70–99)

## 2022-05-13 LAB — MAGNESIUM: Magnesium: 2.2 mg/dL (ref 1.7–2.4)

## 2022-05-13 MED ORDER — PREDNISONE 20 MG PO TABS
40.0000 mg | ORAL_TABLET | Freq: Every day | ORAL | Status: AC
  Administered 2022-05-14: 40 mg via ORAL
  Filled 2022-05-13: qty 2

## 2022-05-13 MED ORDER — AMIODARONE HCL 200 MG PO TABS
400.0000 mg | ORAL_TABLET | Freq: Two times a day (BID) | ORAL | Status: DC
  Administered 2022-05-13 – 2022-05-17 (×9): 400 mg via ORAL
  Filled 2022-05-13 (×9): qty 2

## 2022-05-13 MED ORDER — PREDNISONE 20 MG PO TABS
20.0000 mg | ORAL_TABLET | Freq: Every day | ORAL | Status: DC
  Administered 2022-05-17: 20 mg via ORAL
  Filled 2022-05-13: qty 1

## 2022-05-13 MED ORDER — AMIODARONE HCL IN DEXTROSE 360-4.14 MG/200ML-% IV SOLN
30.0000 mg/h | INTRAVENOUS | Status: AC
  Administered 2022-05-13: 30 mg/h via INTRAVENOUS
  Filled 2022-05-13: qty 200

## 2022-05-13 MED ORDER — LOSARTAN POTASSIUM 50 MG PO TABS: 50.0000 mg | ORAL_TABLET | Freq: Every day | ORAL | Status: DC

## 2022-05-13 MED ORDER — LOSARTAN POTASSIUM 25 MG PO TABS
25.0000 mg | ORAL_TABLET | Freq: Every day | ORAL | Status: DC
  Administered 2022-05-14 – 2022-05-16 (×3): 25 mg via ORAL
  Filled 2022-05-13 (×4): qty 1

## 2022-05-13 MED ORDER — PREDNISONE 20 MG PO TABS
30.0000 mg | ORAL_TABLET | Freq: Every day | ORAL | Status: AC
  Administered 2022-05-15 – 2022-05-16 (×2): 30 mg via ORAL
  Filled 2022-05-13 (×3): qty 1

## 2022-05-13 MED ORDER — PREDNISONE 10 MG PO TABS: 10.0000 mg | ORAL_TABLET | Freq: Every day | ORAL | Status: DC

## 2022-05-13 NOTE — Progress Notes (Signed)
Mobility Specialist Progress Note:   05/13/22 1150  Mobility  Activity Ambulated with assistance in hallway  Level of Assistance Standby assist, set-up cues, supervision of patient - no hands on  Assistive Device Front wheel walker  Distance Ambulated (ft) 60 ft  Activity Response Tolerated well  $Mobility charge 1 Mobility   Pt in bed willing to participate in mobility. Complaints of R foot pain. Left at sink to get washed up. All needs met.   Gareth Eagle Aman Batley Mobility Specialist Please contact via [employer] or  Rehab Office at [phone]

## 2022-05-13 NOTE — Progress Notes (Signed)
PROGRESS NOTE [name]  KPT:[id number] DOB: 11/29/1965 DOA: 05/03/2022 PCP: [name], MD   Brief Narrative/Hospital Course: 56 year old male with history of HFrEF and ICM status post ICD placement, type 2 diabetes, chronic low back pain with a spondylosis and spinal stenosis, obesity BMI 35, CKD 3 baseline creatinine 1.8-2.3, GERD brought to the ED for diarrhea after recently increasing his Trulicity on 17/00 having increasing diarrhea, repeated LE with ICD shock on 11/25 unable to take p.o. on 11/25 EMS called brought to the ED. In the ED had witnessed V. tach arrest underwent CPR with ROSC with 1 shock return to baseline neurological status lab with potassium 2.8 creatinine 2.85 baseline 1.8-2.3, leukocytosis 19.5 low-grade fever also hypotension, currently consulted admitted to ICU Hypokalemia stabilized with aggressive replacement.Found to have new buttock abscess/cellulitis and surgery consulted.Patient transferred to [facility] service 11/30. Surgery evaluated ultrasound obtained on drainable fluid collection and signed off. Patient continued to have poorly arthralgias seen by Hilbert Odor from orthopedics, with elevated uric acid and symptomatology consistent with gout but did not improve on colchicine changed to prednisone 12/1 12/5: VT storm he had Shock from his ICD, left heart cath showed no CAD    Subjective: Seen and examined this morning No chest pain Has been ambulating per nursing staff, minimal to none heel and ankle pain On IV amiodarone since yesterday Overnight patient afebrile, BP stable Labs reviewed this morning with improving creatinine 1.7 hemoglobin stable  Assessment and Plan:  V. tach  Storm: On admission 2/2 hypokalemia, again recurrent VT storm 12/4 despite stable electrolytes.  Seen by EP underwent left heart cath also placed on amiodarone infusion 12/4> EP considering adding an elite to his ICD system to allow for a pacing to improve heart rate,  better allow amiodarone and addition of beta-blocker, keeping n.p.o. after midnight tonight.keep mag above 2K above 4.  Chronic systolic CHF-EF 17-49% this admission similar to previous NICM w ICD in place: Chest pain: Overall euvolemic. Seen by cardiology. Holding off on diuretic due to his diarrhea.   GDMT per cardiology on losartan, Aldactone. Cont aspirin, Lipitor. Monitor intake output and daily weight as below.  Left heart cath with 12/4 no CAD. Net IO Since Admission: 2,473.7 mL [05/13/22 1316]   Hypokalemia: Due to diarrhea,resolved after aggressive replacement.  K remains on higher side.  Monitor Recent Labs  Lab 05/09/22 0149 05/10/22 0100 05/11/22 0051 05/11/22 1127 05/12/22 0135 05/12/22 1015 05/12/22 1533 05/13/22 0229  K 3.5 4.7 5.2* 4.8 5.2* 4.3 5.0  5.0 5.0  CALCIUM 8.4* 8.6* 8.4*  --  8.2*  --   --  8.1*  MG 2.2 2.1 2.2  --  2.2  --   --  2.2  Hypotension suspected hypovolemic shock due to diarrhea.  BP stable.  Diarrhea suspected due to increase in Trulicity dose: upto 20 episodes of diarrhea per day, Trulicity discontinued> C. Difficile and GI penl negative.  Significantly better.  Continue supportive care with Imodium,Banatrol.  Type 2 diabetes mellitus with uncontrolled hyperglycemia: At home on Trulicity, Farxiga, NovoLog FlexPen 10 units twice daily, Lantus 30 units daily.Trulicity discontinued due to side effects. Blood sugar is poorly controlled due to steroid > we will start weaning down the steroid so we will likely need lowering on his current insulin regimen, currently on Levemir to 20 BID this am, premeal novolog 10 u tid,cont ssi 0-9 u. HBa1c 10.2 indicating poor outpatient control.  DM coordinator consulted Recent Labs  Lab 05/12/22 1202 05/12/22 1810 05/12/22  2256 05/13/22 0631 05/13/22 1154  GLUCAP 193* 193* 249* 193* 128*   Cellulitis of the buttock: ultrasound soft tissue 11/30- shows region of concern along the right buttock infiltrative  subcutaneous edema no abscess or drainable process observed. CT scan 11/27 no evidence of abscess or drainable fluid collection.  Surgery signed off, no need for procedure.  Empiric Zosyn will be continued x10 days until 12/6, continue wound care.  Leukocytosis resolving  Leukocytosis: WBC elevated 13-15 K> peaked at 20.2> resolving. Suspect in the setting of prednisone use, and also contributed by polyarthritis/gout, on Zosyn for cellulitis of the buttock.   Recent Labs  Lab 05/09/22 0149 05/10/22 0100 05/11/22 0051 05/12/22 0135 05/13/22 0229  WBC 16.5* 20.2* 18.2* 15.2* 11.7*   Polyarthritis  Right knee pain-better Left foot/right foot left hand swelling and pain History of plantar fasciitis Large foreign body on right knee asymptomatic seen by Hilbert Odor from orthopedics: Complains of pain mostly on his heels also mild ankle pain and swelling> was placed on colchicine in ICU but did not improve, prednisone added 12/1 dose further increased currently on 40 mg twice daily with improvement.  We will start to taper down> tapering dose ordered.Previously seen by Dr. Lynann Bologna -outpatient for plantar fasciitis of left foot.  AKI on CKD IIIb: b/l creatinine close to 2.3 back in August.  Significantly improved 1.7 Recent Labs    05/05/22 0635 05/05/22 1533 05/06/22 1645 05/07/22 0535 05/08/22 0229 05/09/22 0149 05/10/22 0100 05/11/22 0051 05/12/22 0135 05/13/22 0229  BUN 43* 45* 39* 37* 30* 25* 25* 31* 41* 36*  CREATININE 3.06* 3.08* 2.86* 2.74* 2.51* 2.02* 2.10* 1.99* 2.00* 1.79*  Hyponatremia: Improved  Class II Obesity:Patient's Body mass index is 36.03 kg/m. : Will benefit with PCP follow-up, weight loss  healthy lifestyle and outpatient sleep evaluation.  DVT prophylaxis: heparin injection 5,000 Units Start: 05/13/22 0600 SCDs Start: 05/03/22 2111 Code Status:   Code Status: Full Code Family Communication: plan of care discussed with patient at bedside.  Patient  status is: inpatient because of hypokalemia Level of care: Telemetry Cardiac  Dispo: The patient is from: home  with his brother            Anticipated disposition: TBD pending clearance from cardiology Objective: Vitals last 24 hrs: Vitals:   05/13/22 0500 05/13/22 0611 05/13/22 0836 05/13/22 1233  BP:  134/71 95/69 117/75  Pulse:  (!) 54 72 75  Resp:  12 20 19   Temp:  97.7 F (36.5 C) 97.7 F (36.5 C) 98.5 F (36.9 C)  TempSrc:  Oral Oral Oral  SpO2:  94% 95% 96%  Weight: 120.5 kg     Height:       Weight change: 0.433 kg  Physical Examination: General exam: AAOX3, weak,older appearing HEENT:Oral mucosa moist, Ear/Nose WNL grossly, dentition normal. Respiratory system: bilaterally CLEAR BS,no use of accessory muscle Cardiovascular system: S1 & S2 +, regular rate, JVD neg. Gastrointestinal system: Abdomen soft, NT,ND,BS+ Nervous System:Alert, awake, moving extremities and grossly nonfocal Extremities: LE ankle edema mild, lower extremities warm Skin: No rashes,no icterus. MSK: Normal muscle bulk,tone, power    Medications reviewed:  Scheduled Meds:  amiodarone  400 mg Oral BID   atorvastatin  10 mg Oral Daily   colchicine  0.3 mg Oral BID   fiber supplement (BANATROL TF)  60 mL Oral BID   heparin  5,000 Units Subcutaneous Q8H   insulin aspart  0-9 Units Subcutaneous TID WC   insulin aspart  10 Units Subcutaneous  TID WC   insulin detemir  20 Units Subcutaneous BID   losartan  25 mg Oral Daily   predniSONE  40 mg Oral BID WC   sodium chloride flush  3 mL Intravenous Q12H   sodium chloride flush  3 mL Intravenous Q12H   tamsulosin  0.4 mg Oral Daily   Continuous Infusions:  sodium chloride     amiodarone 30 mg/hr (05/13/22 1104)   piperacillin-tazobactam (ZOSYN)  IV 3.375 g (05/13/22 2683)    Diet Order             Diet NPO time specified Except for: Sips with Meds  Diet effective midnight           Diet Heart Room service appropriate? Yes with Assist; Fluid  consistency: Thin  Diet effective now                  Intake/Output Summary (Last 24 hours) at 05/13/2022 1316 Last data filed at 05/13/2022 1309 Gross per 24 hour  Intake 1750.2 ml  Output 2550 ml  Net -799.8 ml   Net IO Since Admission: 2,473.7 mL [05/13/22 1316]  Wt Readings from Last 3 Encounters:  05/13/22 120.5 kg  04/04/22 118 kg  03/21/22 117.5 kg     Unresulted Labs (From admission, onward)     Start     Ordered   05/10/22 0500  CBC  Daily,   R     Question:  Specimen collection method  Answer:  Lab=Lab collect   05/09/22 1649   05/10/22 4196  Basic metabolic panel  Daily,   R     Question:  Specimen collection method  Answer:  Lab=Lab collect   05/09/22 1649   05/06/22 0500  Magnesium  Daily,   R     Question:  Specimen collection method  Answer:  Lab=Lab collect   05/05/22 0757          Data Reviewed: I have personally reviewed following labs and imaging studies CBC: Recent Labs  Lab 05/09/22 0149 05/10/22 0100 05/11/22 0051 05/12/22 0135 05/12/22 1533 05/13/22 0229  WBC 16.5* 20.2* 18.2* 15.2*  --  11.7*  HGB 10.8* 11.1* 11.6* 11.5* 12.2*  12.2* 12.1*  HCT 32.5* 33.4* 33.2* 33.8* 36.0*  36.0* 35.2*  MCV 91.0 90.5 89.5 89.7  --  89.3  PLT 334 401* 451* 506*  --  222*   Basic Metabolic Panel: Recent Labs  Lab 05/09/22 0149 05/10/22 0100 05/11/22 0051 05/11/22 1127 05/12/22 0135 05/12/22 1015 05/12/22 1533 05/13/22 0229  NA 135 135 135  --  133*  --  137  137 134*  K 3.5 4.7 5.2* 4.8 5.2* 4.3 5.0  5.0 5.0  CL 102 105 106  --  106  --   --  109  CO2 21* 19* 20*  --  20*  --   --  18*  GLUCOSE 173* 235* 229*  --  327*  --   --  269*  BUN 25* 25* 31*  --  41*  --   --  36*  CREATININE 2.02* 2.10* 1.99*  --  2.00*  --   --  1.79*  CALCIUM 8.4* 8.6* 8.4*  --  8.2*  --   --  8.1*  MG 2.2 2.1 2.2  --  2.2  --   --  2.2   GFR: Estimated Creatinine Clearance: 61.1 mL/min (A) (by C-G formula based on SCr of 1.79 mg/dL (H)). Liver  Function Tests: Recent Labs  Lab 05/06/22 1645 05/12/22 1040  AST 40 18  ALT 30 26  ALKPHOS 62 58  BILITOT 1.0 0.4  PROT 6.7 6.8  ALBUMIN 2.5* 2.5*   Recent Labs    01/09/22 1002 03/21/22 1452  PROBNP 97 18.0   Recent Results (from the past 240 hour(s))  MRSA Next Gen by PCR, Nasal     Status: None   Collection Time: 05/03/22 10:48 PM   Specimen: Nasal Mucosa; Nasal Swab  Result Value Ref Range Status   MRSA by PCR Next Gen NOT DETECTED NOT DETECTED Final    Comment: (NOTE) The GeneXpert MRSA Assay (FDA approved for NASAL specimens only), is one component of a comprehensive MRSA colonization surveillance program. It is not intended to diagnose MRSA infection nor to guide or monitor treatment for MRSA infections. Test performance is not FDA approved in patients less than 45 years old. Performed at [facility], [place] [address], [place], [place] [address]   C Difficile Quick Screen w PCR reflex     Status: None   Collection Time: 05/05/22  8:35 PM   Specimen: Stool  Result Value Ref Range Status   C Diff antigen NEGATIVE NEGATIVE Final   C Diff toxin NEGATIVE NEGATIVE Final   C Diff interpretation No C. difficile detected.  Final    Comment: Performed at [facility], [place] [address] [place], [place] [address]  Gastrointestinal Panel by PCR , Stool     Status: None   Collection Time: 05/06/22  9:53 PM   Specimen: Stool  Result Value Ref Range Status   Campylobacter species NOT DETECTED NOT DETECTED Final   Plesimonas shigelloides NOT DETECTED NOT DETECTED Final   Salmonella species NOT DETECTED NOT DETECTED Final   Yersinia enterocolitica NOT DETECTED NOT DETECTED Final   Vibrio species NOT DETECTED NOT DETECTED Final   Vibrio cholerae NOT DETECTED NOT DETECTED Final   Enteroaggregative E coli (EAEC) NOT DETECTED NOT DETECTED Final   Enteropathogenic E coli (EPEC) NOT DETECTED NOT DETECTED Final   Enterotoxigenic E coli (ETEC) NOT DETECTED NOT DETECTED  Final   Shiga like toxin producing E coli (STEC) NOT DETECTED NOT DETECTED Final   Shigella/Enteroinvasive E coli (EIEC) NOT DETECTED NOT DETECTED Final   Cryptosporidium NOT DETECTED NOT DETECTED Final   Cyclospora cayetanensis NOT DETECTED NOT DETECTED Final   Entamoeba histolytica NOT DETECTED NOT DETECTED Final   Giardia lamblia NOT DETECTED NOT DETECTED Final   Adenovirus F40/41 NOT DETECTED NOT DETECTED Final   Astrovirus NOT DETECTED NOT DETECTED Final   Norovirus GI/GII NOT DETECTED NOT DETECTED Final   Rotavirus A NOT DETECTED NOT DETECTED Final   Sapovirus (I, II, IV, and V) NOT DETECTED NOT DETECTED Final    Comment: Performed at [facility], [address] [place], [place] [address]    Antimicrobials: Anti-infectives (From admission, onward)    Start     Dose/Rate Route Frequency Ordered Stop   05/05/22 1400  piperacillin-tazobactam (ZOSYN) IVPB 3.375 g        3.375 g 12.5 mL/hr over 240 Minutes Intravenous Every 8 hours 05/05/22 1158 05/13/22 2359   05/05/22 1030  amoxicillin-clavulanate (AUGMENTIN) 875-125 MG per tablet 1 tablet  Status:  Discontinued        1 tablet Oral Every 12 hours 05/05/22 0936 05/05/22 1158      Culture/Microbiology    Component Value Date/Time   SDES  09/18/2017 0040    BLOOD LEFT HAND Performed at [facility], [address] [place], [place] [address]    [facility]  09/18/2017 0040    BOTTLES DRAWN AEROBIC AND ANAEROBIC Blood Culture adequate volume Performed at [facility], [address] [place], [place] [address]    CULT  09/18/2017 0040    NO GROWTH 5 DAYS Performed at [facility], [place] [address] [place], [place] [address]    REPTSTATUS 09/23/2017 FINAL 09/18/2017 0040  Radiology Studies: CARDIAC CATHETERIZATION  Result Date: 05/13/2022 HEMODYNAMICS: RA:   1 mmHg (mean) RV:   22/1-3 mmHg PA:   23/6 mmHg (12 mean) PCWP:  4 mmHg (mean)    Estimated Fick CO/CI   7 L/min, 2.9 L/min/m2     TPG    8  mmHg     PVR     ~1 Wood Units PAPi      >5  IMPRESSION: Low pre and post capillary filling pressures. Normal cardiac output/cardiac index. Normal PVR & PA mean No obstructive CAD, left dominant w/ large Lcx supplying multiple large marginals. Aditya [name] Advanced Heart Failure 5:46 PM    LOS: 10 days   Antonieta Pert, MD [facility]  05/13/2022, 1:16 PM

## 2022-05-13 NOTE — Plan of Care (Signed)
°  Problem: Clinical Measurements: °Goal: Will remain free from infection °Outcome: Progressing °Goal: Respiratory complications will improve °Outcome: Progressing °  °

## 2022-05-13 NOTE — Progress Notes (Signed)
Electrophysiology Rounding Note  Patient Name: [name] Date of Encounter: 05/13/2022  Primary Cardiologist: [name], MD Electrophysiologist: Thompson Grayer, MD   Subjective   Pt had recurrent VT this am despite electrolytes WNL,somewhat elevated.   Feeling very overwhelmed and upset currently. Was hoping to go home today. Having intermittent dizziness and SOB, not correlating with tele (since shock)  Inpatient Medications    Scheduled Meds:  aspirin EC  81 mg Oral Daily   atorvastatin  10 mg Oral Daily   colchicine  0.3 mg Oral BID   fiber supplement (BANATROL TF)  60 mL Oral BID   heparin  5,000 Units Subcutaneous Q8H   insulin aspart  0-9 Units Subcutaneous TID WC   insulin aspart  10 Units Subcutaneous TID WC   insulin detemir  20 Units Subcutaneous BID   losartan  25 mg Oral Daily   predniSONE  40 mg Oral BID WC   sodium chloride flush  3 mL Intravenous Q12H   sodium chloride flush  3 mL Intravenous Q12H   tamsulosin  0.4 mg Oral Daily   Continuous Infusions:  sodium chloride     amiodarone 30 mg/hr (05/12/22 2251)   piperacillin-tazobactam (ZOSYN)  IV 3.375 g (05/13/22 0624)   PRN Meds: sodium chloride, acetaminophen, Gerhardt's butt cream, HYDROmorphone, levalbuterol, [COMPLETED] loperamide **FOLLOWED BY** loperamide, ondansetron (ZOFRAN) IV, mouth rinse, oxyCODONE, sodium chloride flush   Vital Signs    Vitals:   05/12/22 2200 05/13/22 0500 05/13/22 0611 05/13/22 0836  BP: (!) 141/87  134/71 95/69  Pulse: 61  (!) 54 72  Resp: 19  12 20   Temp:   97.7 F (36.5 C) 97.7 F (36.5 C)  TempSrc:   Oral Oral  SpO2: 99%  94% 95%  Weight:  120.5 kg    Height:        Intake/Output Summary (Last 24 hours) at 05/13/2022 1007 Last data filed at 05/13/2022 0700 Gross per 24 hour  Intake 1510.2 ml  Output 2250 ml  Net -739.8 ml   Filed Weights   05/11/22 0401 05/12/22 0500 05/13/22 0500  Weight: 119.7 kg 120.1 kg 120.5 kg    Physical Exam     GEN- The patient is tearful appearing, alert and oriented x 3 today.   HEENT- No gross abnormality.  Lungs- Clear to ausculation bilaterally, normal work of breathing Heart- Regular rate and rhythm, no murmurs, rubs or gallops GI- soft, NT, ND, + BS Extremities- no clubbing or cyanosis. No edema Neuro- No obvious focal abnormality.   Labs    CBC Recent Labs    05/12/22 0135 05/12/22 1533 05/13/22 0229  WBC 15.2*  --  11.7*  HGB 11.5* 12.2*  12.2* 12.1*  HCT 33.8* 36.0*  36.0* 35.2*  MCV 89.7  --  89.3  PLT 506*  --  130*   Basic Metabolic Panel Recent Labs    05/12/22 0135 05/12/22 1015 05/12/22 1533 05/13/22 0229  NA 133*  --  137  137 134*  K 5.2*   < > 5.0  5.0 5.0  CL 106  --   --  109  CO2 20*  --   --  18*  GLUCOSE 327*  --   --  269*  BUN 41*  --   --  36*  CREATININE 2.00*  --   --  1.79*  CALCIUM 8.2*  --   --  8.1*  MG 2.2  --   --  2.2   < > =  values in this interval not displayed.   Liver Function Tests Recent Labs    05/12/22 1040  AST 18  ALT 26  ALKPHOS 58  BILITOT 0.4  PROT 6.8  ALBUMIN 2.5*   No results for input(s): "LIPASE", "AMYLASE" in the last 72 hours. Cardiac Enzymes No results for input(s): "CKTOTAL", "CKMB", "CKMBINDEX", "TROPONINI" in the last 72 hours.   Telemetry    Telemetry shows SB/NSR 50-70s mostly. Nocturnal rates to 40's, NSVT burden is less, some seem to be triggered by RV pacing, others do not start by a paced beat. (personally reviewed)   05/12/22: R/LHC HEMODYNAMICS: RA:                  1 mmHg (mean) RV:                  22/1-3 mmHg PA:                  23/6 mmHg (12 mean) PCWP:            4 mmHg (mean)                                      Estimated Fick CO/CI   7 L/min, 2.9 L/min/m2                                                 TPG                 8  mmHg                                              PVR                 ~1 Wood Units  PAPi                >5       IMPRESSION: Low pre and post  capillary filling pressures.  Normal cardiac output/cardiac index.  Normal PVR & PA mean No obstructive CAD, left dominant w/ large Lcx supplying multiple large marginals.   Radiology     Patient Profile     [name] is a 56 y.o. male with a hx of NICM/chronic systolic CHF s/p ICD, normal coronary arteries by cath 2015, GERD, HTN and DM2 who is being seen today for the evaluation of ventricular tachycardia/Vfib with multiple ICD shocks at the request of Dr. Gwenlyn Found.     Assessment & Plan    1.  VT storm Pt had 22 ICD shocks and 2 ATP, 10 aborted shocks on initial arrival. Felt to be likely in setting of hypokalemia and infection, but now recurred despite management of both.   Keep K > 4.0 and Mg > 2.0   Pt has been started on amiodarone and has renal disease, so likely only best option is continuing amiodarone.  Could potentially continue to manage his acute issues and attempt to wean to mexiletine as outpatient given his young age.  His PMVT not amenable to ablation. VT/VF this am appears prompted by V pacing, as well as multiple episodes of NSVT.  Reviewed  episodes from admission, which were NOT related to V pacing.   Dr. Quentin Ore has seen the patient this morning Discussed option of adding an A lead to his ICD system to allow for A pacing to improve HR, better allow amiodarone and addition of BB   Discussed doing to morrow with Dr. Curt Bears, will leave him NPO after MN tonight for a tentative procedure tomorrow late afternoon.  I will reduce his pacing rate to 35bpm today to try and avoid RV pacing Transition to PO amiodarone today 400mg  BID for 5 days then 400mg  daily after that  Will look at procedure scheduling/timing tomorrow AM with Dr. Curt Bears Patient is aware     2. Chronic systolic CHF 3. NICM Echo 01/2021 LVEF 30% -> 05/06/2022 LVEF 30-35% Home GDMT on hold with diarrhea and AKI HF team is on board   4. Diarrhea 5. Right gluteal cellulitis and wound Per  primary Gen surgery following. On IV ABx  I do not note any open wounds. Remains on Zosyn apparent empiric treatment [place] yesterday showed: there is infiltrative subcutaneous edema. No abscess or drainable process observed.  Surgery signed off   5. AKI on CKD III Baseline Cr 1.9-2.1 Cr 1.79 today.   6. Poorly controlled DM Deferred to IM  For questions or updates, please contact [place] Please consult [email or web address] for contact info under Cardiology/STEMI.  Signed, Baldwin [place], PA-C  05/13/2022, 10:07 AM

## 2022-05-13 NOTE — Progress Notes (Signed)
Advanced Heart Failure Rounding Note  PCP-Cardiologist: [name], MD   Subjective:   SCr improving 3.08>>2.86>>2>>1.79  Echo 11/28 EF 30-35%, RV normal. The inferior vena cava is normal in size with greater than 50% respiratory variability, suggesting right atrial pressure of 3 mmHg. BNP 487.   Remains on Zosyn for buttock wound. GS now s/o. [place] yesterday showed: there is infiltrative subcutaneous edema. No abscess or drainable process observed.  Complained of intt CP yesterday, not associated with other S&S. Proceeded with [facility] which showed:   HEMODYNAMICS: RA:                  1 mmHg (mean) RV:                  22/1-3 mmHg PA:                  23/6 mmHg (12 mean) PCWP:            4 mmHg (mean)    Estimated Fick CO/CI   7 L/min, 2.9 L/min/m2            TPG                 8  mmHg                                              PVR                 ~1 Wood Units  PAPi                >5    IMPRESSION: Low pre and post capillary filling pressures.  Normal cardiac output/cardiac index.  Normal PVR & PA mean No obstructive CAD, left dominant w/ large Lcx supplying multiple large marginals.    Prior to procedure was having frequent PVCs that morning, went into VT and received shock by ICD. Started on amio gtt.  Feels better this morning. No chest pain or SOB. Feet hurt less today.   Objective:   Weight Range: 120.5 kg Body mass index is 36.03 kg/m.   Vital Signs:   Temp:  [97.6 F (36.4 C)-98.6 F (37 C)] 97.7 F (36.5 C) (12/05 0611) Pulse Rate:  [54-77] 54 (12/05 0611) Resp:  [12-28] 12 (12/05 0611) BP: (99-143)/(63-94) 134/71 (12/05 0611) SpO2:  [91 %-99 %] 94 % (12/05 0611) Weight:  [120.5 kg] 120.5 kg (12/05 0500) Last BM Date : 05/09/22  Weight change: Filed Weights   05/11/22 0401 05/12/22 0500 05/13/22 0500  Weight: 119.7 kg 120.1 kg 120.5 kg    Intake/Output:   Intake/Output Summary (Last 24 hours) at 05/13/2022 0704 Last data filed at 05/13/2022  0658 Gross per 24 hour  Intake 1270.2 ml  Output 2250 ml  Net -979.8 ml     Physical Exam    General:  well appearing.  No respiratory difficulty HEENT: normal Neck: supple. No JVD. Carotids 2+ bilat; no bruits. No lymphadenopathy or thyromegaly appreciated. Cor: PMI nondisplaced. Regular rate & rhythm. No rubs, gallops or murmurs. Lungs: clear Abdomen: soft, nontender, nondistended. No hepatosplenomegaly. No bruits or masses. Good bowel sounds. Extremities: no cyanosis, clubbing, rash, edema  Neuro: alert & oriented x 3, cranial nerves grossly intact. moves all 4 extremities w/o difficulty. Affect pleasant.   Telemetry   NSR 60s  (Personally reviewed)  EKG    Sinus rhythm with 1st degree A-V block 70bpm Right axis deviation Poor R wave progression  Labs    CBC Recent Labs    05/12/22 0135 05/12/22 1533 05/13/22 0229  WBC 15.2*  --  11.7*  HGB 11.5* 12.2*  12.2* 12.1*  HCT 33.8* 36.0*  36.0* 35.2*  MCV 89.7  --  89.3  PLT 506*  --  119*   Basic Metabolic Panel Recent Labs    05/12/22 0135 05/12/22 1015 05/12/22 1533 05/13/22 0229  NA 133*  --  137  137 134*  K 5.2*   < > 5.0  5.0 5.0  CL 106  --   --  109  CO2 20*  --   --  18*  GLUCOSE 327*  --   --  269*  BUN 41*  --   --  36*  CREATININE 2.00*  --   --  1.79*  CALCIUM 8.2*  --   --  8.1*  MG 2.2  --   --  2.2   < > = values in this interval not displayed.   Liver Function Tests Recent Labs    05/12/22 1040  AST 18  ALT 26  ALKPHOS 58  BILITOT 0.4  PROT 6.8  ALBUMIN 2.5*   No results for input(s): "LIPASE", "AMYLASE" in the last 72 hours. Cardiac Enzymes No results for input(s): "CKTOTAL", "CKMB", "CKMBINDEX", "TROPONINI" in the last 72 hours.  BNP: BNP (last 3 results) Recent Labs    02/01/22 1702 05/03/22 2010 05/06/22 1736  BNP 11.8 350.8* 487.7*    ProBNP (last 3 results) Recent Labs    01/09/22 1002 03/21/22 1452  PROBNP 97 18.0     D-Dimer No results for  input(s): "DDIMER" in the last 72 hours. Hemoglobin A1C No results for input(s): "HGBA1C" in the last 72 hours. Fasting Lipid Panel No results for input(s): "CHOL", "HDL", "LDLCALC", "TRIG", "CHOLHDL", "LDLDIRECT" in the last 72 hours. Thyroid Function Tests No results for input(s): "TSH", "T4TOTAL", "T3FREE", "THYROIDAB" in the last 72 hours.  Invalid input(s): "FREET3"   Other results:   Imaging    CARDIAC CATHETERIZATION  Result Date: 05/13/2022 HEMODYNAMICS: RA:   1 mmHg (mean) RV:   22/1-3 mmHg PA:   23/6 mmHg (12 mean) PCWP:  4 mmHg (mean)    Estimated Fick CO/CI   7 L/min, 2.9 L/min/m2    TPG    8  mmHg     PVR     ~1 Wood Units PAPi      >5  IMPRESSION: Low pre and post capillary filling pressures. Normal cardiac output/cardiac index. Normal PVR & PA mean No obstructive CAD, left dominant w/ large Lcx supplying multiple large marginals. Aditya [name] Advanced Heart Failure 5:46 PM    Medications:     Scheduled Medications:  aspirin EC  81 mg Oral Daily   atorvastatin  10 mg Oral Daily   colchicine  0.3 mg Oral BID   fiber supplement (BANATROL TF)  60 mL Oral BID   heparin  5,000 Units Subcutaneous Q8H   insulin aspart  0-9 Units Subcutaneous TID WC   insulin aspart  10 Units Subcutaneous TID WC   insulin detemir  20 Units Subcutaneous BID   losartan  12.5 mg Oral Daily   predniSONE  40 mg Oral BID WC   sodium chloride flush  3 mL Intravenous Q12H   sodium chloride flush  3 mL Intravenous Q12H   tamsulosin  0.4 mg  Oral Daily    Infusions:  sodium chloride     amiodarone 30 mg/hr (05/12/22 2251)   piperacillin-tazobactam (ZOSYN)  IV 3.375 g (05/13/22 0624)    PRN Medications: sodium chloride, acetaminophen, Gerhardt's butt cream, HYDROmorphone, levalbuterol, [COMPLETED] loperamide **FOLLOWED BY** loperamide, ondansetron (ZOFRAN) IV, mouth rinse, oxyCODONE, sodium chloride flush    Patient Profile   56 y/o AAM w/ h/o chronic systolic heart failure due to  NICM, s/p ICD, admitted w/ VT storm treated w/ multiple ICD shocks w/ witnessed VT arrest while in ED treated w/ CPR and defib, all in setting of metabolic derangement/hypokalemia from GI loses. AHF team consulted for assistance w/ med management in setting of worsening renal function and soft BP, concerning for potential low output HF   Assessment/Plan   1. VT Storm - had 22 ICD shocks and 2 ATP, 10 aborted shocks - in setting of hypokalemia from GI loses, initial K 2.8 (suspected etiology) - loaded w/ IV amiodarone, K corrected - VT has been quiescent since 11/25 w/ repletion of K.  - Amio stopped 11/28.  - doubt ischemic component. Normal cath in 2015. No CP. + mild HS trop elevation but flat trend, most c/w demand ischemia, L/RHC yesterday with no CAD - Had VT yesterday with ICD shock. Amiodarone gtt restarted. EP following.  - May be able to switch to amiodarone PO later today once load complete.    2. Chronic Systolic Heart Failure  - NICM - Echo 07/2013 EF 20-25%. RV normal>> LHC normal cors. Repeat Echo 5/15 EF 30-35% - s/p ICD 7/15 - Echo 8/22, EF 30%, RV normal - Echo this admit stable, EF 30-35%, IVC not dilated  - [facility] 12/4 with low pre and post capillary filling pressures, normal CO/CI, no CAD - labs not c/w low output, Lactic acid and LFTs nl  - Around baseline Cr. Holding spiro w/ K 5 - restart losartan 12.5 today - No BB for now with bradycardia - no SGLT2 w/ perineal infection    3. AKI on Stage IIIb CKD - b/l SCr ~2.0 - elevated this admit, ~3.0, 1.79 today (around baseline) - suspect due to dehydration from GI losses  - follow BMP closely.      4. Right gluteal cellulitis and wound - CT scan with no evidence for abscess or drainable fluid collection - GS following, US done  - c/w abx, on Zosyn    5. Type DM - insulin per primary team   6. New Polyarticular Pain  - left ankle and rt knee - Uric acid 10 and plain films showed: Possible 8 mm intra-articular  loose body. This could be confirmed with MRI examination if indicated. No fracture or dislocation. - Suspected gout, colchicine started - On Prednisone per primary  7. Hyperkalemia - K 5, continue to hold spiro  - restart tomorrow?  8. Atypical chest Pain - complaining of intt chest pain 12/4am, not reproducible. - EKG done at bedside showed SB in 72s.  - HsTrop 6 - 16' LHC demonstrating normal coronary anatomy - LHC yesterday with no CAD - resolved   Length of Stay: [place], NP  05/13/2022, 7:04 AM  Advanced Heart Failure Team Pager [phone] (M-F; 7a - 5p)  Please contact [facility] for night-coverage after hours (5p -7a ) and weekends on [email or web address]

## 2022-05-14 ENCOUNTER — Inpatient Hospital Stay (HOSPITAL_COMMUNITY): Payer: Medicaid Other

## 2022-05-14 ENCOUNTER — Encounter (HOSPITAL_COMMUNITY)
Admission: EM | Disposition: A | Discharge status: Home / Self Care | Payer: Self-pay | Source: Home / Self Care | Attending: Internal Medicine

## 2022-05-14 DIAGNOSIS — L0231 Cutaneous abscess of buttock: Secondary | ICD-10-CM | POA: Diagnosis not present

## 2022-05-14 DIAGNOSIS — I469 Cardiac arrest, cause unspecified: Secondary | ICD-10-CM | POA: Diagnosis not present

## 2022-05-14 DIAGNOSIS — N1832 Chronic kidney disease, stage 3b: Secondary | ICD-10-CM | POA: Diagnosis not present

## 2022-05-14 DIAGNOSIS — R0602 Shortness of breath: Secondary | ICD-10-CM | POA: Diagnosis not present

## 2022-05-14 DIAGNOSIS — I472 Ventricular tachycardia, unspecified: Secondary | ICD-10-CM | POA: Diagnosis not present

## 2022-05-14 DIAGNOSIS — E1165 Type 2 diabetes mellitus with hyperglycemia: Secondary | ICD-10-CM | POA: Diagnosis not present

## 2022-05-14 LAB — BASIC METABOLIC PANEL
Anion gap: 9 (ref 5–15)
BUN: 34 mg/dL — ABNORMAL HIGH (ref 6–20)
CO2: 19 mmol/L — ABNORMAL LOW (ref 22–32)
Calcium: 8.7 mg/dL — ABNORMAL LOW (ref 8.9–10.3)
Chloride: 109 mmol/L (ref 98–111)
Creatinine, Ser: 1.81 mg/dL — ABNORMAL HIGH (ref 0.61–1.24)
GFR, Estimated: 43 mL/min — ABNORMAL LOW (ref >60)
Glucose, Bld: 212 mg/dL — ABNORMAL HIGH (ref 70–99)
Potassium: 4.6 mmol/L (ref 3.5–5.1)
Sodium: 137 mmol/L (ref 135–145)

## 2022-05-14 LAB — CBC
HCT: 34.9 % — ABNORMAL LOW (ref 39.0–52.0)
Hemoglobin: 11.2 g/dL — ABNORMAL LOW (ref 13.0–17.0)
MCH: 29.5 pg (ref 26.0–34.0)
MCHC: 32.1 g/dL (ref 30.0–36.0)
MCV: 91.8 fL (ref 80.0–100.0)
Platelets: 516 10*3/uL — ABNORMAL HIGH (ref 150–400)
RBC: 3.8 MIL/uL — ABNORMAL LOW (ref 4.22–5.81)
RDW: 14.9 % (ref 11.5–15.5)
WBC: 12.3 10*3/uL — ABNORMAL HIGH (ref 4.0–10.5)
nRBC: 0.2 % (ref 0.0–0.2)

## 2022-05-14 LAB — GLUCOSE, CAPILLARY
Glucose-Capillary: 101 mg/dL — ABNORMAL HIGH (ref 70–99)
Glucose-Capillary: 243 mg/dL — ABNORMAL HIGH (ref 70–99)
Glucose-Capillary: 215 mg/dL — ABNORMAL HIGH (ref 70–99)
Glucose-Capillary: 265 mg/dL — ABNORMAL HIGH (ref 70–99)
Glucose-Capillary: 247 mg/dL — ABNORMAL HIGH (ref 70–99)

## 2022-05-14 LAB — MAGNESIUM: Magnesium: 2.1 mg/dL (ref 1.7–2.4)

## 2022-05-14 SURGERY — ICD IMPLANT

## 2022-05-14 MED ORDER — CEFAZOLIN IN SODIUM CHLORIDE 3-0.9 GM/100ML-% IV SOLN
3.0000 g | INTRAVENOUS | Status: DC
  Filled 2022-05-14: qty 100

## 2022-05-14 MED ORDER — CHLORHEXIDINE GLUCONATE 4 % EX LIQD: 60.0000 mL | Freq: Once | CUTANEOUS | Status: DC

## 2022-05-14 MED ORDER — SODIUM CHLORIDE 0.9 % IV SOLN
80.0000 mg | INTRAVENOUS | Status: DC
  Filled 2022-05-14: qty 2

## 2022-05-14 MED ORDER — SODIUM CHLORIDE 0.9 % IV SOLN: INTRAVENOUS | Status: DC

## 2022-05-14 MED FILL — Verapamil HCl IV Soln 2.5 MG/ML: INTRAVENOUS | Qty: 2 | Status: AC

## 2022-05-14 NOTE — Progress Notes (Signed)
Progress Note   Patient: [name] YQM:[id number] DOB: 18-Jul-1965 DOA: 05/03/2022     11 DOS: the patient was seen and examined on 05/14/2022   Brief hospital course: 56 year old man chronic systolic CHF, ICM, status post ICD presented to ED with history of diarrhea, ICD shock, in the emergency department had witnessed V. tach arrest, underwent CPR with ROSC with 1 shock with return to baseline neurologic status, admitted to ICU.  Stabilized.  Also developed buttocks abscess/cellulitis and seen by general surgery.  Transferred to hospitalist service 11/30.  12/5 had VT storm, left heart catheter showed no CAD.  Further management per electrophysiology.  Assessment and Plan: VT storm Continue management as per cardiology/electrophysiology including amiodarone.  Plans being made to pursue upgrade of device, held off today because of respiratory status.   Chronic systolic CHF-EF 52-84% this admission similar to previous NICM w ICD in place: Chest pain: LVEF 30-35%.  GDMT on hold with presenting diarrhea and AKI. Continue management per advanced heart failure team.    Type 2 diabetes mellitus with uncontrolled hyperglycemia:  Stable.  Continue Levemir, meal coverage, sliding scale insulin  Cellulitis of the buttock CT scan 11/27 no evidence of abscess or drainable fluid collection.  Surgery signed off, no need for procedure.  Treated with empiric antibiotics.   Leukocytosis:  Multifactorial in the setting of infection,   Polyarthritis  Right knee pain-better Left foot/right foot left hand swelling and pain History of plantar fasciitis Large foreign body on right knee asymptomatic seen by Hilbert Odor from orthopedics: Complains of pain mostly on his heels also mild ankle pain and swelling> was placed on colchicine in ICU but did not improve, prednisone added 12/1 dose further increased currently on 40 mg twice daily with improvement.  Did per.   AKI on CKD IIIb: b/l creatinine  close to 2.3 back in August.  Appears to be at baseline at this point.  Hyponatremia: Resolved.   Class II Obesity:Patient's Body mass index is 36.03 kg/m. : Will benefit with PCP follow-up, weight loss  healthy lifestyle and outpatient sleep evaluation.  Hypotension suspected hypovolemic shock due to diarrhea.  Resolved   Diarrhea suspected due to increase in Trulicity dose: upto 20 episodes of diarrhea per day, Trulicity discontinued> C. Difficile and GI penl negative.  Resolved.     Subjective:  Had a rough morning Eating some Some chest discomfort, he thinks from the resuscitation effort  Physical Exam: Vitals:   05/14/22 0822 05/14/22 1118 05/14/22 1617 05/14/22 1958  BP: 113/74 132/80 122/76 118/70  Pulse: 63 62 74 70  Resp: 18 13 18 20   Temp: 98 F (36.7 C) 98.1 F (36.7 C) 97.9 F (36.6 C) 98.3 F (36.8 C)  TempSrc: Oral Oral Oral Oral  SpO2: 97% 96% 97% 97%  Weight:      Height:       Physical Exam Vitals reviewed.  Constitutional:      General: He is not in acute distress.    Appearance: He is not ill-appearing or toxic-appearing.  Cardiovascular:     Rate and Rhythm: Normal rate and regular rhythm.     Heart sounds: No murmur heard. Pulmonary:     Effort: Pulmonary effort is normal. No respiratory distress.     Breath sounds: No wheezing, rhonchi or rales.  Neurological:     Mental Status: He is alert.  Psychiatric:        Mood and Affect: Mood normal.        Behavior:  Behavior normal.     Data Reviewed: CBG stable Creatinine stable 1.81 Hgb stable 11.2  Family Communication: none  Disposition: Status is: Inpatient Remains inpatient appropriate because: VT storm  Planned Discharge Destination: Home    Time spent: 20 minutes  Author: Murray Hodgkins, MD 05/14/2022 8:15 PM  For on call review [email or web address].

## 2022-05-14 NOTE — Progress Notes (Signed)
Advanced Heart Failure Rounding Note  PCP-Cardiologist: [name], MD   Subjective:    Had recurrent VT 12/4, treated w/ ICD shock. Triggered by intermittent V Pacing. Started on amio gtt. Cath snowed no obstructive CAD.   NSR on tele. No further VT/NSVT. Now on PO amio, K 4.6, Mg 2.1. EP considering placement of atrial lead to prevent V pacing.   SCr improving 3.08>>2.86>>2.0>>1.79>>1.81  Feels "jittery" this morning. NPO. Feels blood sugar is dropping. No other complaints.    _____________________________________ Cardiac Studies  R/LHC 12/4  RA:                  1 mmHg (mean) RV:                  22/1-3 mmHg PA:                  23/6 mmHg (12 mean) PCWP:            4 mmHg (mean)    Estimated Fick CO/CI   7 L/min, 2.9 L/min/m2            TPG                 8  mmHg                                              PVR                 ~1 Wood Units  PAPi                >5    IMPRESSION: Low pre and post capillary filling pressures.  Normal cardiac output/cardiac index.  Normal PVR & PA mean No obstructive CAD, left dominant w/ large Lcx supplying multiple large marginals.   Echo 11/28 EF 30-35%, RV normal.   Objective:   Weight Range: 120.5 kg Body mass index is 36.02 kg/m.   Vital Signs:   Temp:  [97.6 F (36.4 C)-99.5 F (37.5 C)] 98.6 F (37 C) (12/06 0603) Pulse Rate:  [60-75] 61 (12/06 0603) Resp:  [18-21] 20 (12/06 0603) BP: (95-144)/(69-85) 144/85 (12/06 0603) SpO2:  [95 %-100 %] 96 % (12/06 0603) Weight:  [120.5 kg] 120.5 kg (12/06 0633) Last BM Date : 05/13/22  Weight change: Filed Weights   05/12/22 0500 05/13/22 0500 05/14/22 0633  Weight: 120.1 kg 120.5 kg 120.5 kg    Intake/Output:   Intake/Output Summary (Last 24 hours) at 05/14/2022 0821 Last data filed at 05/13/2022 2323 Gross per 24 hour  Intake 480 ml  Output 575 ml  Net -95 ml     Physical Exam    General:  fatigued appearing. No respiratory difficulty HEENT: normal Neck:  supple. no JVD. Carotids 2+ bilat; no bruits. No lymphadenopathy or thyromegaly appreciated. Cor: PMI nondisplaced. Regular rate & rhythm. No rubs, gallops or murmurs. Lungs: clear Abdomen: soft, nontender, nondistended. No hepatosplenomegaly. No bruits or masses. Good bowel sounds. Extremities: no cyanosis, clubbing, rash, edema Neuro: alert & oriented x 3, cranial nerves grossly intact. moves all 4 extremities w/o difficulty. Affect pleasant.   Telemetry   NSR 60s. No further VT/ NSVT  (Personally reviewed)    EKG    Sinus rhythm with 1st degree A-V block 70bpm Right axis deviation Poor R wave progression  Labs    CBC  Recent Labs    05/13/22 0229 05/14/22 0130  WBC 11.7* 12.3*  HGB 12.1* 11.2*  HCT 35.2* 34.9*  MCV 89.3 91.8  PLT 540* 938*   Basic Metabolic Panel Recent Labs    05/13/22 0229 05/14/22 0130  NA 134* 137  K 5.0 4.6  CL 109 109  CO2 18* 19*  GLUCOSE 269* 212*  BUN 36* 34*  CREATININE 1.79* 1.81*  CALCIUM 8.1* 8.7*  MG 2.2 2.1   Liver Function Tests Recent Labs    05/12/22 1040  AST 18  ALT 26  ALKPHOS 58  BILITOT 0.4  PROT 6.8  ALBUMIN 2.5*   No results for input(s): "LIPASE", "AMYLASE" in the last 72 hours. Cardiac Enzymes No results for input(s): "CKTOTAL", "CKMB", "CKMBINDEX", "TROPONINI" in the last 72 hours.  BNP: BNP (last 3 results) Recent Labs    02/01/22 1702 05/03/22 2010 05/06/22 1736  BNP 11.8 350.8* 487.7*    ProBNP (last 3 results) Recent Labs    01/09/22 1002 03/21/22 1452  PROBNP 97 18.0     D-Dimer No results for input(s): "DDIMER" in the last 72 hours. Hemoglobin A1C No results for input(s): "HGBA1C" in the last 72 hours. Fasting Lipid Panel No results for input(s): "CHOL", "HDL", "LDLCALC", "TRIG", "CHOLHDL", "LDLDIRECT" in the last 72 hours. Thyroid Function Tests No results for input(s): "TSH", "T4TOTAL", "T3FREE", "THYROIDAB" in the last 72 hours.  Invalid input(s): "FREET3"   Other  results:   Imaging    No results found.   Medications:     Scheduled Medications:  amiodarone  400 mg Oral BID   atorvastatin  10 mg Oral Daily   colchicine  0.3 mg Oral BID   fiber supplement (BANATROL TF)  60 mL Oral BID   insulin aspart  0-9 Units Subcutaneous TID WC   insulin aspart  10 Units Subcutaneous TID WC   insulin detemir  20 Units Subcutaneous BID   losartan  25 mg Oral Daily   [START ON 05/19/2022] predniSONE  10 mg Oral Q breakfast   [START ON 05/17/2022] predniSONE  20 mg Oral Q breakfast   [START ON 05/15/2022] predniSONE  30 mg Oral Q breakfast   predniSONE  40 mg Oral Q breakfast   sodium chloride flush  3 mL Intravenous Q12H   sodium chloride flush  3 mL Intravenous Q12H   tamsulosin  0.4 mg Oral Daily    Infusions:  sodium chloride      PRN Medications: sodium chloride, acetaminophen, Gerhardt's butt cream, HYDROmorphone, levalbuterol, [COMPLETED] loperamide **FOLLOWED BY** loperamide, ondansetron (ZOFRAN) IV, mouth rinse, oxyCODONE, sodium chloride flush    Patient Profile   56 y/o AAM w/ h/o chronic systolic heart failure due to NICM, s/p ICD, admitted w/ VT storm treated w/ multiple ICD shocks w/ witnessed VT arrest while in ED treated w/ CPR and defib, all in setting of metabolic derangement/hypokalemia from GI loses. AHF team consulted for assistance w/ med management in setting of worsening renal function and soft BP, concerning for potential low output HF   Assessment/Plan   1. VT Storm - had 22 ICD shocks and 2 ATP, 10 aborted shocks - in setting of hypokalemia from GI loses, initial K 2.8 (suspected etiology) - loaded w/ IV amiodarone, K corrected - VT had been quiescent since 11/25 w/ repletion of K but w/ recurrence 12/4 treated w/ ICD shock. Event triggered by intermittent V Pacing - LHC w/ nonobstructive CAD  - EP following. Considering placement of an atrial lead  to help prevent V pacing and allow [place] to add appropriate medical  therapy (AAD and BB) without inciting NSVT/VT episodes.   - Continue PO amio 400 mg bid  2. Chronic Systolic Heart Failure  - NICM - Echo 07/2013 EF 20-25%. RV normal>> LHC normal cors. Repeat Echo 5/15 EF 30-35% - s/p ICD 7/15 - Echo 8/22, EF 30%, RV normal - Echo this admit stable, EF 30-35%, IVC not dilated  - [facility] 12/4 with low pre and post capillary filling pressures, normal CO/CI, no CAD - labs not c/w low output, Lactic acid and LFTs nl  - Around baseline Cr. Holding spiro w/ K 5 - continue losartan 25 mg  - No BB for now with bradycardia - no SGLT2 w/ perineal infection    3. AKI on Stage IIIb CKD - b/l SCr ~2.0 - elevated this admit, ~3.0, 1.8 today (around baseline) - suspect due to dehydration from GI losses  - follow BMP closely.      4. Right gluteal cellulitis and wound - CT scan with no ev - GS has s/o    5. Type DM - insulin per primary team, hold currently w/ NPO status   6. New Polyarticular Pain  - left ankle and rt knee - Uric acid 10 and plain films showed: Possible 8 mm intra-articular loose body. This could be confirmed with MRI examination if indicated. No fracture or dislocation. - Suspected gout, colchicine started - On Prednisone per primary  7. Hyperkalemia - resolved  - continue to hold spiro   8. Atypical chest Pain - complaining of intt chest pain 12/4am, not reproducible. - EKG done at bedside showed SB in 70s.  - HsTrop 6 - 39' LHC demonstrating normal coronary anatomy - LHC this admit with no CAD - resolved   Length of Stay: [address] PA-C  05/14/2022, 8:21 AM  Advanced Heart Failure Team Pager [phone] (M-F; 7a - 5p)  Please contact [facility] for night-coverage after hours (5p -7a ) and weekends on [email or web address]

## 2022-05-14 NOTE — Progress Notes (Addendum)
Electrophysiology Rounding Note  Patient Name: [name] Date of Encounter: 05/14/2022  Primary Cardiologist: [name], MD Electrophysiologist: Thompson Grayer, MD   Subjective   Feeling "OK" SOB at 45 degrees  Very anxious following 23 shocks  Inpatient Medications    Scheduled Meds:  amiodarone  400 mg Oral BID   atorvastatin  10 mg Oral Daily   colchicine  0.3 mg Oral BID   fiber supplement (BANATROL TF)  60 mL Oral BID   insulin aspart  0-9 Units Subcutaneous TID WC   insulin aspart  10 Units Subcutaneous TID WC   insulin detemir  20 Units Subcutaneous BID   losartan  25 mg Oral Daily   [START ON 05/19/2022] predniSONE  10 mg Oral Q breakfast   [START ON 05/17/2022] predniSONE  20 mg Oral Q breakfast   [START ON 05/15/2022] predniSONE  30 mg Oral Q breakfast   predniSONE  40 mg Oral Q breakfast   sodium chloride flush  3 mL Intravenous Q12H   sodium chloride flush  3 mL Intravenous Q12H   tamsulosin  0.4 mg Oral Daily   Continuous Infusions:  sodium chloride     PRN Meds: sodium chloride, acetaminophen, Gerhardt's butt cream, HYDROmorphone, levalbuterol, [COMPLETED] loperamide **FOLLOWED BY** loperamide, ondansetron (ZOFRAN) IV, mouth rinse, oxyCODONE, sodium chloride flush   Vital Signs    Vitals:   05/13/22 2137 05/14/22 0603 05/14/22 0633 05/14/22 0822  BP: 131/73 (!) 144/85  113/74  Pulse: 60 61  63  Resp: (!) 21 20  18   Temp: 97.6 F (36.4 C) 98.6 F (37 C)  98 F (36.7 C)  TempSrc: Oral Oral  Oral  SpO2: 98% 96%  97%  Weight:   120.5 kg   Height:        Intake/Output Summary (Last 24 hours) at 05/14/2022 0841 Last data filed at 05/13/2022 2323 Gross per 24 hour  Intake 480 ml  Output 575 ml  Net -95 ml   Filed Weights   05/12/22 0500 05/13/22 0500 05/14/22 0633  Weight: 120.1 kg 120.5 kg 120.5 kg    Physical Exam    GEN- The patient is tearful appearing, alert and oriented x 3 today.   HEENT- No gross abnormality.  Lungs-  Clear to ausculation bilaterally, normal work of breathing Heart- Regular rate and rhythm, no murmurs, rubs or gallops GI- soft, NT, ND, + BS Extremities- no clubbing or cyanosis. No edema Neuro- No obvious focal abnormality.   Labs    CBC Recent Labs    05/13/22 0229 05/14/22 0130  WBC 11.7* 12.3*  HGB 12.1* 11.2*  HCT 35.2* 34.9*  MCV 89.3 91.8  PLT 540* 161*   Basic Metabolic Panel Recent Labs    05/13/22 0229 05/14/22 0130  NA 134* 137  K 5.0 4.6  CL 109 109  CO2 18* 19*  GLUCOSE 269* 212*  BUN 36* 34*  CREATININE 1.79* 1.81*  CALCIUM 8.1* 8.7*  MG 2.2 2.1   Liver Function Tests Recent Labs    05/12/22 1040  AST 18  ALT 26  ALKPHOS 58  BILITOT 0.4  PROT 6.8  ALBUMIN 2.5*   No results for input(s): "LIPASE", "AMYLASE" in the last 72 hours. Cardiac Enzymes No results for input(s): "CKTOTAL", "CKMB", "CKMBINDEX", "TROPONINI" in the last 72 hours.   Telemetry    Telemetry shows SB/NSR 50-70s mostly. Nocturnal rates to 40's, NSVT burden is less, some seem to be triggered by RV pacing, others do not start by  a paced beat. (personally reviewed)   05/12/22: R/LHC HEMODYNAMICS: RA:                  1 mmHg (mean) RV:                  22/1-3 mmHg PA:                  23/6 mmHg (12 mean) PCWP:            4 mmHg (mean)                                      Estimated Fick CO/CI   7 L/min, 2.9 L/min/m2                                                 TPG                 8  mmHg                                              PVR                 ~1 Wood Units  PAPi                >5       IMPRESSION: Low pre and post capillary filling pressures.  Normal cardiac output/cardiac index.  Normal PVR & PA mean No obstructive CAD, left dominant w/ large Lcx supplying multiple large marginals.   Radiology     Patient Profile     [name] is a 56 y.o. male with a hx of NICM/chronic systolic CHF s/p ICD, normal coronary arteries by cath 2015, GERD, HTN and  DM2 who is being seen today for the evaluation of ventricular tachycardia/Vfib with multiple ICD shocks at the request of Dr. Gwenlyn Found.      Device information Abbot single chamber ICD implanted 12/23/2013   Assessment & Plan    1.  VT storm Pt had 22 ICD shocks and 2 ATP, 10 aborted shocks on initial arrival. Felt to be likely in setting of hypokalemia, diarrhea, and infection.  However had recurrent VT despite management and correction of both and back on amiodarone  > LHC with no obstructive CAD  Keep K > 4.0 and Mg > 2.0  Both remain improved  Pt has been started on amiodarone and has renal disease, felt to be his best option   Discussed perhaps could potentially continue to manage his acute issues and attempt to wean to mexiletine as outpatient given his young age.   Bradycardic with amiodarone Will still need/benefit from resuming BB when able for his CM and VT  His PMVT not amenable to ablation. VT/VF in the last few days appears provoked by V pacing, as well as spontaneous episodes of NSVT.   Reviewed episodes from admission, which were NOT related to V pacing.   pacing rate reduced to 35bpm yesterday to try and avoid RV pacing/triggered VT  One episode of RV pacing last night (at 35bpm) triggering again PVCs, NSVT (3 beats)  Transition to  PO amiodarone yesterday 400mg  BID for 5 days then 400mg  daily after that  Narrow QRS 131ms  Planned for upgrade of his device to a dual chamber adding a A lead today with Dr. Caryl Comes  Sip of juice/cola this AM for low sugar will be OK for his procedure  Dr. Curt Bears has seen the patient this AM, Dr. Caryl Comes will later this AM to discuss further plans and procedure.   2. Chronic systolic CHF 3. NICM Echo 01/2021 LVEF 30% -> 05/06/2022 LVEF 30-35% Home GDMT on hold with presenting diarrhea and AKI HF team is on board and managing Remains off BB with bradycardia   4. Diarrhea 5. Right gluteal cellulitis and wound Per  primary  suspected due to increase in Trulicity dose out patient:  Trulicity discontinued> C. Difficile and GI penl negative.   Significantly better.     Gen surgery signed off 05/09/22 note: "No signs of abscess or indication for surgery at this time given exam more consistent with cellulitis and recent CT and u/s negative for underlying abscess. Continue antibiotics. If exam becomes more concerning for abscess development could repeat u/s and call us back. We will sign off, please call with questions or concerns." "No real signs of abscess that is drainable. May coalesce to abscess with time.  Would rec [place] to eval and can then drain at that time."  Empiric course of  zosyn finishes today 11/30- shows region of concern along the right buttock infiltrative subcutaneous edema no abscess or drainable process observed.  CT scan 11/27 no evidence of abscess or drainable fluid collection.   Surgery signed off, no need for procedure.   Empiric Zosyn will be continued x10 days until 12/6   I do not note any open wounds. WBC 12.3 (initially 19.5 > wobbled there > 20.2 > 18 > 15 > 11.7 > 12.3 Afebrile, no symptoms of illness On prednisone for gout/polyarthritis with plans to taper starts today  Pt reports gluteal pain, diarrhea and arthritis are all much improved     5. AKI on CKD III Baseline Cr 1.9-2.1 Cr 1.81 today.   6. Poorly controlled DM Deferred to IM CBG this AM 101 though pt reports numbers < 150 make him feel poorly and given some PO juice and cola Deferred to IM D/w RN to avoid any further PO with plans for procedure this afternoon . Management of BS per medicine team    ADEND: Pt is seen by Dr. Caryl Comes, RV pacing at fater rates did not provoke VT. Pt though uncomfortable laying at 30 degrees and do not think he can tolerate the procedure today "Feels like Im drowning"  though remains better then when he came in Will defer volume management to HF team once MD comes around,  not currently getting diuretics, though fluid neg last couple days, spiro stopped 2/2 higher K+. We will revisit procedure timing tomorrow again. IVF stopped, diet ordered Tommye Standard, PA-C   For questions or updates, please contact [facility] Please consult [email or web address] for contact info under Cardiology/STEMI.  Signed, Baldwin Jamaica, PA-C  05/14/2022, 8:41 AM   VT PM assoc with bradycardia and V Pacing and monomorphic triggering beat    ICD   Post shock PTSD  DM brittle  NICM  Gluteal abscess     Unfortunately there is scant data that I can find on the patient's telemetry record regarding the polymorphic ventricular tachycardia is associated at presentation; the only one that I did see had a long  coupling interval of about 600 ms consistent with torsade de pointes which is a likely explanation in the context of hypokalemia.  These events more recently have been bradycardia related preceded by ventricular pacing followed by a monomorphic triggering beat and then short runs of nonsustained polymorphic VT mostly.  The coupling interval of these beats is about 400 to 450 ms it was important that she he be cathed and noteworthy that that there is no coronary disease.  This still begs the issue as to the mechanism of these events, and I worry that it is related to the ventricular pacing itself, the first beat is similar in axis but this distinct morphologically from the paced beat.  To explore this hypothesis we then tried to pace him at 70 bpm for a few minutes to see if it engendered coupling beats.  Unfortunately, he did not tolerate it given his VA block.  Hence, I think that hypothesis remains incomplete, but I think practically the idea put forth by Dr. Daune Perch regarding atrial pacing to prevent bradycardia cardia and ventricular pacing is a reasonable current undertaking, the idea of extracting is too much; however, I would consider putting another ventricular lead perhaps a left  bundle branch area at the time of the atrial lead.  Timing of this is good be important; he can just is finishing antibiotics for his abscess.  He is relatively stable rhythm wise.  He remains quite dyspneic even at 45+ degrees.  His filling pressures were relatively low making the question as to the mechanism of this and I wonder whether maybe some of this could be psychogenic dyspnea.  I would consider using a benzodiazepine in the setting of this high anxiety related to his VT storm.  We will plan to proceed with device upgrade as this respiratory status permits

## 2022-05-15 ENCOUNTER — Other Ambulatory Visit: Payer: Self-pay

## 2022-05-15 ENCOUNTER — Encounter (HOSPITAL_COMMUNITY)
Admission: EM | Disposition: A | Discharge status: Home / Self Care | Payer: Self-pay | Source: Home / Self Care | Attending: Internal Medicine

## 2022-05-15 ENCOUNTER — Other Ambulatory Visit (HOSPITAL_COMMUNITY): Payer: Self-pay

## 2022-05-15 DIAGNOSIS — N1832 Chronic kidney disease, stage 3b: Secondary | ICD-10-CM | POA: Diagnosis not present

## 2022-05-15 DIAGNOSIS — E1165 Type 2 diabetes mellitus with hyperglycemia: Secondary | ICD-10-CM | POA: Diagnosis not present

## 2022-05-15 DIAGNOSIS — L0231 Cutaneous abscess of buttock: Secondary | ICD-10-CM | POA: Diagnosis not present

## 2022-05-15 DIAGNOSIS — I472 Ventricular tachycardia, unspecified: Secondary | ICD-10-CM | POA: Diagnosis not present

## 2022-05-15 DIAGNOSIS — I469 Cardiac arrest, cause unspecified: Secondary | ICD-10-CM | POA: Diagnosis not present

## 2022-05-15 DIAGNOSIS — I4901 Ventricular fibrillation: Secondary | ICD-10-CM | POA: Diagnosis not present

## 2022-05-15 HISTORY — PX: LEAD REVISION/REPAIR: EP1213

## 2022-05-15 LAB — GLUCOSE, CAPILLARY
Glucose-Capillary: 222 mg/dL — ABNORMAL HIGH (ref 70–99)
Glucose-Capillary: 159 mg/dL — ABNORMAL HIGH (ref 70–99)
Glucose-Capillary: 184 mg/dL — ABNORMAL HIGH (ref 70–99)
Glucose-Capillary: 205 mg/dL — ABNORMAL HIGH (ref 70–99)
Glucose-Capillary: 318 mg/dL — ABNORMAL HIGH (ref 70–99)

## 2022-05-15 LAB — CBC
HCT: 34.2 % — ABNORMAL LOW (ref 39.0–52.0)
Hemoglobin: 11.6 g/dL — ABNORMAL LOW (ref 13.0–17.0)
MCH: 30.4 pg (ref 26.0–34.0)
MCHC: 33.9 g/dL (ref 30.0–36.0)
MCV: 89.8 fL (ref 80.0–100.0)
Platelets: 551 10*3/uL — ABNORMAL HIGH (ref 150–400)
RBC: 3.81 MIL/uL — ABNORMAL LOW (ref 4.22–5.81)
RDW: 14.9 % (ref 11.5–15.5)
WBC: 11.1 10*3/uL — ABNORMAL HIGH (ref 4.0–10.5)
nRBC: 0.2 % (ref 0.0–0.2)

## 2022-05-15 LAB — BASIC METABOLIC PANEL
Anion gap: 7 (ref 5–15)
BUN: 30 mg/dL — ABNORMAL HIGH (ref 6–20)
CO2: 20 mmol/L — ABNORMAL LOW (ref 22–32)
Calcium: 8.2 mg/dL — ABNORMAL LOW (ref 8.9–10.3)
Chloride: 110 mmol/L (ref 98–111)
Creatinine, Ser: 1.78 mg/dL — ABNORMAL HIGH (ref 0.61–1.24)
GFR, Estimated: 44 mL/min — ABNORMAL LOW (ref >60)
Glucose, Bld: 272 mg/dL — ABNORMAL HIGH (ref 70–99)
Potassium: 4.7 mmol/L (ref 3.5–5.1)
Sodium: 137 mmol/L (ref 135–145)

## 2022-05-15 LAB — MAGNESIUM: Magnesium: 1.9 mg/dL (ref 1.7–2.4)

## 2022-05-15 SURGERY — LEAD REVISION/REPAIR

## 2022-05-15 MED ORDER — SODIUM CHLORIDE 0.9% FLUSH
3.0000 mL | Freq: Two times a day (BID) | INTRAVENOUS | Status: DC
  Administered 2022-05-15 – 2022-05-17 (×4): 3 mL via INTRAVENOUS

## 2022-05-15 MED ORDER — LIDOCAINE HCL (PF) 1 % IJ SOLN
INTRAMUSCULAR | Status: AC
  Filled 2022-05-15: qty 60

## 2022-05-15 MED ORDER — IOHEXOL 350 MG/ML SOLN
INTRAVENOUS | Status: DC | PRN
  Administered 2022-05-15: 15 mL

## 2022-05-15 MED ORDER — SODIUM CHLORIDE 0.9 % IV SOLN
INTRAVENOUS | Status: AC
  Filled 2022-05-15: qty 2

## 2022-05-15 MED ORDER — FENTANYL CITRATE (PF) 100 MCG/2ML IJ SOLN
INTRAMUSCULAR | Status: AC
  Filled 2022-05-15: qty 2

## 2022-05-15 MED ORDER — HEPARIN (PORCINE) IN NACL 1000-0.9 UT/500ML-% IV SOLN
INTRAVENOUS | Status: AC
  Filled 2022-05-15: qty 500

## 2022-05-15 MED ORDER — CEFAZOLIN SODIUM-DEXTROSE 2-4 GM/100ML-% IV SOLN
2.0000 g | Freq: Four times a day (QID) | INTRAVENOUS | Status: AC
  Administered 2022-05-15 – 2022-05-16 (×3): 2 g via INTRAVENOUS
  Filled 2022-05-15 (×3): qty 100

## 2022-05-15 MED ORDER — INSULIN ASPART 100 UNIT/ML IJ SOLN
14.0000 [IU] | Freq: Three times a day (TID) | INTRAMUSCULAR | Status: DC
  Administered 2022-05-16 (×3): 14 [IU] via SUBCUTANEOUS

## 2022-05-15 MED ORDER — CHLORHEXIDINE GLUCONATE 4 % EX LIQD
60.0000 mL | Freq: Once | CUTANEOUS | Status: AC
  Administered 2022-05-15: 4 via TOPICAL

## 2022-05-15 MED ORDER — SODIUM CHLORIDE 0.9 % IV SOLN
80.0000 mg | INTRAVENOUS | Status: AC
  Administered 2022-05-15: 80 mg
  Filled 2022-05-15: qty 2

## 2022-05-15 MED ORDER — DEXTROSE-NACL 5-0.45 % IV SOLN: INTRAVENOUS | Status: DC

## 2022-05-15 MED ORDER — MIDAZOLAM HCL 5 MG/5ML IJ SOLN
INTRAMUSCULAR | Status: DC | PRN
  Administered 2022-05-15 (×4): 1 mg via INTRAVENOUS

## 2022-05-15 MED ORDER — SODIUM CHLORIDE 0.9% FLUSH: 3.0000 mL | INTRAVENOUS | Status: DC | PRN

## 2022-05-15 MED ORDER — LIDOCAINE HCL (PF) 1 % IJ SOLN
INTRAMUSCULAR | Status: DC | PRN
  Administered 2022-05-15: 60 mL

## 2022-05-15 MED ORDER — FUROSEMIDE 10 MG/ML IJ SOLN
40.0000 mg | Freq: Once | INTRAMUSCULAR | Status: AC
  Administered 2022-05-15: 40 mg via INTRAVENOUS
  Filled 2022-05-15: qty 4

## 2022-05-15 MED ORDER — MAGNESIUM SULFATE 2 GM/50ML IV SOLN
2.0000 g | Freq: Once | INTRAVENOUS | Status: AC
  Administered 2022-05-15: 2 g via INTRAVENOUS
  Filled 2022-05-15: qty 50

## 2022-05-15 MED ORDER — SODIUM CHLORIDE 0.9 % IV SOLN: 250.0000 mL | INTRAVENOUS | Status: DC

## 2022-05-15 MED ORDER — FENTANYL CITRATE (PF) 100 MCG/2ML IJ SOLN
INTRAMUSCULAR | Status: DC | PRN
  Administered 2022-05-15 (×4): 25 ug via INTRAVENOUS

## 2022-05-15 MED ORDER — MIDAZOLAM HCL 5 MG/5ML IJ SOLN
INTRAMUSCULAR | Status: AC
  Filled 2022-05-15: qty 5

## 2022-05-15 MED ORDER — HEPARIN (PORCINE) IN NACL 1000-0.9 UT/500ML-% IV SOLN
INTRAVENOUS | Status: DC | PRN
  Administered 2022-05-15: 500 mL

## 2022-05-15 MED ORDER — SODIUM CHLORIDE 0.9 % IV SOLN: INTRAVENOUS | Status: DC

## 2022-05-15 MED ORDER — CEFAZOLIN IN SODIUM CHLORIDE 3-0.9 GM/100ML-% IV SOLN
3.0000 g | INTRAVENOUS | Status: AC
  Administered 2022-05-15: 3 g via INTRAVENOUS
  Filled 2022-05-15 (×2): qty 100

## 2022-05-15 SURGICAL SUPPLY — 11 items
CABLE SURGICAL S-101-97-12 (CABLE) ×1 IMPLANT
ICD GALLANT DR CDDRA500Q (ICD Generator) IMPLANT
KIT MICROPUNCTURE NIT STIFF (SHEATH) IMPLANT
LEAD ULTIPACE 52 LPA1231/52 (Lead) IMPLANT
MAT PREVALON FULL STRYKER (MISCELLANEOUS) IMPLANT
PAD DEFIB RADIO PHYSIO CONN (PAD) ×1 IMPLANT
POUCH AIGIS-R ANTIBACT ICD (Mesh General) ×1 IMPLANT
POUCH AIGIS-R ANTIBACT ICD LRG (Mesh General) IMPLANT
SHEATH 7FR PRELUDE SNAP 13 (SHEATH) IMPLANT
SHEATH PROBE COVER 6X72 (BAG) IMPLANT
TRAY PACEMAKER INSERTION (PACKS) ×1 IMPLANT

## 2022-05-15 NOTE — Inpatient Diabetes Management (Signed)
Inpatient Diabetes Program Recommendations  AACE/ADA: New Consensus Statement on Inpatient Glycemic Control (2015)  Target Ranges:  Prepandial:   less than 140 mg/dL      Peak postprandial:   less than 180 mg/dL (1-2 hours)      Critically ill patients:  140 - 180 mg/dL   Lab Results  Component Value Date   GLUCAP 222 (H) 05/15/2022   HGBA1C 10.2 (H) 05/04/2022    Review of Glycemic Control  Latest Reference Range & Units 05/14/22 10:02 05/14/22 11:21 05/14/22 16:16 05/14/22 21:22 05/15/22 06:24  Glucose-Capillary 70 - 99 mg/dL 243 (H) 215 (H) 265 (H) 247 (H) 222 (H)  (H): Data is abnormally high Diabetes history: Type 2 DM Outpatient Diabetes medications: Farxiga 5 mg QD, Lantus 30 units QD, Novolog 10 units TID, Trulicity 1.5 mg Qwk Current orders for Inpatient glycemic control: Levemir 20 units BID, Novolog 10 units TID, Novolog 0-9 units TID Prednisone 30 mg QD  Inpatient Diabetes Program Recommendations:    In the setting of steroids: Consider increasing Novolog to 14 units TID (assuming patient consuming >50% of meals).   Thanks, Bronson Curb, MSN, RNC-OB Diabetes Coordinator [phone] (8a-5p)

## 2022-05-15 NOTE — Progress Notes (Signed)
Progress Note   Patient: [name] YQI:[id number] DOB: 12/18/1965 DOA: 05/03/2022     12 DOS: the patient was seen and examined on 05/15/2022   Brief hospital course: 56 year old man chronic systolic CHF, ICM, status post ICD presented to ED with history of diarrhea, ICD shock, in the emergency department had witnessed V. tach arrest, underwent CPR with ROSC with 1 shock with return to baseline neurologic status, admitted to ICU.  Stabilized.  Also developed buttocks abscess/cellulitis and seen by general surgery.  Transferred to hospitalist service 11/30.  12/5 had VT storm, left heart catheter showed no CAD.  Further management per electrophysiology.  Assessment and Plan: VT storm Continue management as per cardiology/electrophysiology including amiodarone.  Device upgrade today.   Chronic systolic CHF-EF 38-75% this admission similar to previous NICM w ICD in place: Chest pain: LVEF 30-35%.  GDMT on hold with presenting diarrhea and AKI. Continue management per advanced heart failure team.    Type 2 diabetes mellitus with uncontrolled hyperglycemia:  Stable.  Continue Levemir, meal coverage, sliding scale insulin Increasing Novolog to 14 units TID    Cellulitis of the buttock CT scan 11/27 no evidence of abscess or drainable fluid collection.  Surgery signed off, no need for procedure.  Treated with empiric antibiotics.   Leukocytosis:  Multifactorial in the setting of infection,   Polyarthritis  Right knee pain Left foot/right foot left hand swelling and pain History of plantar fasciitis Large foreign body on right knee asymptomatic seen by Hilbert Odor from orthopedics: Complained of pain mostly on his heels also mild ankle pain and swelling> was placed on colchicine in ICU but did not improve, prednisone added 12/1 dose further increased currently on 40 mg twice daily with improvement.     AKI on CKD IIIb: b/l creatinine close to 2.3 back in August.  Appears to be  at baseline at this point.   Hyponatremia: Resolved.   Class II Obesity:Patient's Body mass index is 36.03 kg/m. : Will benefit with PCP follow-up, weight loss  healthy lifestyle and outpatient sleep evaluation.   Hypotension suspected hypovolemic shock due to diarrhea.  Resolved   Diarrhea suspected due to increase in Trulicity dose: upto 20 episodes of diarrhea per day, Trulicity discontinued> C. Difficile and GI penl negative.  Resolved.       Subjective:  Anxious  Physical Exam: Vitals:   05/15/22 0023 05/15/22 0354 05/15/22 0500 05/15/22 0754  BP: (!) 120/52 139/74  136/84  Pulse: 64 62  65  Resp: 19 19  16   Temp: 98.4 F (36.9 C) 98.5 F (36.9 C)  99.1 F (37.3 C)  TempSrc: Oral Oral  Oral  SpO2: 97% 99%  97%  Weight:   120.1 kg   Height:       Physical Exam Vitals and nursing note reviewed.  Constitutional:      General: He is not in acute distress.    Appearance: He is not ill-appearing or toxic-appearing.  Cardiovascular:     Rate and Rhythm: Normal rate and regular rhythm.     Heart sounds: No murmur heard. Pulmonary:     Effort: Pulmonary effort is normal. No respiratory distress.     Breath sounds: No wheezing, rhonchi or rales.  Neurological:     Mental Status: He is alert.  Psychiatric:        Mood and Affect: Mood normal.        Behavior: Behavior normal.     Data Reviewed: Creatinine down to 1.78 Hgb  stable 11.6  Family Communication: none  Disposition: Status is: Inpatient Remains inpatient appropriate because: Pacemaker adjustment, VT storm  Planned Discharge Destination: Home    Time spent: 20 minutes  Author: Murray Hodgkins, MD 05/15/2022 9:36 AM  For on call review [email or web address].

## 2022-05-15 NOTE — Progress Notes (Signed)
Patient brought back to 4E from Cath lab. VSS. Telemetry box reapplied, CCMD notified.   Daymon Larsen, RN

## 2022-05-15 NOTE — Progress Notes (Signed)
Patient stated he feels "shaky" and is requesting dextrose through an IV since he is NPO for procedure today. Current CBG 159. Patient states his blood sugar drops and he feels "sick" when it gets to 100. MD notified.  *1318 Rechecked CBG 184.  Daymon Larsen, RN

## 2022-05-15 NOTE — Progress Notes (Addendum)
Advanced Heart Failure Rounding Note  PCP-Cardiologist: [name], MD   Subjective:    Had recurrent VT 12/4, treated w/ ICD shock. Triggered by intermittent V Pacing. Started on amio gtt. Cath snowed no obstructive CAD.   NSR on tele. No further VT/NSVT. Now on PO amio, K 4.7, Mg 1.9. Going for placement of atrial lead to prevent V pacing, hopefully today. Aborted yesterday 2/2 SOB   SCr improving 3.08>>2.86>>2.0>>1.79>>1.81>>1.78  Feels ok this morning. Up at sink brushing teeth. Anxious about having to be flat for ~45 mins for procedure. Denies CP.   _____________________________________ Cardiac Studies  R/LHC 12/4  RA:                  1 mmHg (mean) RV:                  22/1-3 mmHg PA:                  23/6 mmHg (12 mean) PCWP:            4 mmHg (mean)    Estimated Fick CO/CI   7 L/min, 2.9 L/min/m2            TPG                 8  mmHg                                              PVR                 ~1 Wood Units  PAPi                >5    IMPRESSION: Low pre and post capillary filling pressures.  Normal cardiac output/cardiac index.  Normal PVR & PA mean No obstructive CAD, left dominant w/ large Lcx supplying multiple large marginals.   Echo 11/28 EF 30-35%, RV normal.   Objective:   Weight Range: 120.1 kg Body mass index is 35.91 kg/m.   Vital Signs:   Temp:  [97.9 F (36.6 C)-99.1 F (37.3 C)] 99.1 F (37.3 C) (12/07 0754) Pulse Rate:  [62-74] 65 (12/07 0754) Resp:  [13-20] 16 (12/07 0754) BP: (113-139)/(52-84) 136/84 (12/07 0754) SpO2:  [96 %-99 %] 97 % (12/07 0754) Weight:  [120.1 kg] 120.1 kg (12/07 0500) Last BM Date : 05/14/22  Weight change: Filed Weights   05/13/22 0500 05/14/22 0633 05/15/22 0500  Weight: 120.5 kg 120.5 kg 120.1 kg    Intake/Output:   Intake/Output Summary (Last 24 hours) at 05/15/2022 0809 Last data filed at 05/15/2022 0659 Gross per 24 hour  Intake 720 ml  Output 1300 ml  Net -580 ml     Physical Exam     General:  well appearing.  No respiratory difficulty HEENT: normal Neck: supple. No JVD seen, thick neck. Carotids 2+ bilat; no bruits. No lymphadenopathy or thyromegaly appreciated. Cor: PMI nondisplaced. Regular rate & rhythm. No rubs, gallops or murmurs. Lungs: clear Abdomen: soft, nontender, nondistended. No hepatosplenomegaly. No bruits or masses. Good bowel sounds. Extremities: no cyanosis, clubbing, rash, edema  Neuro: alert & oriented x 3, cranial nerves grossly intact. moves all 4 extremities w/o difficulty. Affect pleasant.   Telemetry   NSR 80s. No further VT/ NSVT  (Personally reviewed)    EKG    No new  EKG to review  Labs    CBC Recent Labs    05/14/22 0130 05/15/22 0213  WBC 12.3* 11.1*  HGB 11.2* 11.6*  HCT 34.9* 34.2*  MCV 91.8 89.8  PLT 516* 409*   Basic Metabolic Panel Recent Labs    05/14/22 0130 05/15/22 0213  NA 137 137  K 4.6 4.7  CL 109 110  CO2 19* 20*  GLUCOSE 212* 272*  BUN 34* 30*  CREATININE 1.81* 1.78*  CALCIUM 8.7* 8.2*  MG 2.1 1.9   Liver Function Tests Recent Labs    05/12/22 1040  AST 18  ALT 26  ALKPHOS 58  BILITOT 0.4  PROT 6.8  ALBUMIN 2.5*   No results for input(s): "LIPASE", "AMYLASE" in the last 72 hours. Cardiac Enzymes No results for input(s): "CKTOTAL", "CKMB", "CKMBINDEX", "TROPONINI" in the last 72 hours.  BNP: BNP (last 3 results) Recent Labs    02/01/22 1702 05/03/22 2010 05/06/22 1736  BNP 11.8 350.8* 487.7*    ProBNP (last 3 results) Recent Labs    01/09/22 1002 03/21/22 1452  PROBNP 97 18.0     D-Dimer No results for input(s): "DDIMER" in the last 72 hours. Hemoglobin A1C No results for input(s): "HGBA1C" in the last 72 hours. Fasting Lipid Panel No results for input(s): "CHOL", "HDL", "LDLCALC", "TRIG", "CHOLHDL", "LDLDIRECT" in the last 72 hours. Thyroid Function Tests No results for input(s): "TSH", "T4TOTAL", "T3FREE", "THYROIDAB" in the last 72 hours.  Invalid input(s):  "FREET3"   Other results:   Imaging    DG CHEST PORT 1 VIEW  Result Date: 05/14/2022 CLINICAL DATA:  Short of breath EXAM: PORTABLE CHEST 1 VIEW COMPARISON:  None Available. FINDINGS: LEFT-sided defibrillator overlies stable enlarged cardiac silhouette. No effusion, infiltrate pneumothorax. IMPRESSION: Cardiomegaly.  No acute cardiopulmonary process. Electronically Signed   By: Suzy Bouchard M.D.   On: 05/14/2022 14:01     Medications:     Scheduled Medications:  amiodarone  400 mg Oral BID   atorvastatin  10 mg Oral Daily   chlorhexidine  60 mL Topical Once   chlorhexidine  60 mL Topical Once   colchicine  0.3 mg Oral BID   fiber supplement (BANATROL TF)  60 mL Oral BID   gentamicin (GARAMYCIN) 80 mg in sodium chloride 0.9 % 500 mL irrigation  80 mg Irrigation On Call   insulin aspart  0-9 Units Subcutaneous TID WC   insulin aspart  10 Units Subcutaneous TID WC   insulin detemir  20 Units Subcutaneous BID   losartan  25 mg Oral Daily   [START ON 05/19/2022] predniSONE  10 mg Oral Q breakfast   [START ON 05/17/2022] predniSONE  20 mg Oral Q breakfast   predniSONE  30 mg Oral Q breakfast   sodium chloride flush  3 mL Intravenous Q12H   sodium chloride flush  3 mL Intravenous Q12H   tamsulosin  0.4 mg Oral Daily    Infusions:  sodium chloride      ceFAZolin (ANCEF) IV      PRN Medications: sodium chloride, acetaminophen, Gerhardt's butt cream, HYDROmorphone, levalbuterol, [COMPLETED] loperamide **FOLLOWED BY** loperamide, ondansetron (ZOFRAN) IV, mouth rinse, oxyCODONE, sodium chloride flush    Patient Profile   56 y/o AAM w/ h/o chronic systolic heart failure due to NICM, s/p ICD, admitted w/ VT storm treated w/ multiple ICD shocks w/ witnessed VT arrest while in ED treated w/ CPR and defib, all in setting of metabolic derangement/hypokalemia from GI loses. AHF team consulted for assistance  w/ med management in setting of worsening renal function and soft BP,  concerning for potential low output HF   Assessment/Plan   1. VT Storm - had 22 ICD shocks and 2 ATP, 10 aborted shocks - in setting of hypokalemia from GI loses, initial K 2.8 (suspected etiology) - loaded w/ IV amiodarone, K corrected - VT had been quiescent since 11/25 w/ repletion of K but w/ recurrence 12/4 treated w/ ICD shock. Event triggered by intermittent V Pacing - LHC w/ nonobstructive CAD  - EP following. Considering placement of an atrial lead to help prevent V pacing and allow [place] to add appropriate medical therapy (AAD and BB) without inciting NSVT/VT episodes. Procedure aborted yesterday 2/2 SOB  - Continue PO amio 400 mg bid per EP  2. Chronic Systolic Heart Failure  - NICM - Echo 07/2013 EF 20-25%. RV normal>> LHC normal cors. Repeat Echo 5/15 EF 30-35% - s/p ICD 7/15 - Echo 8/22, EF 30%, RV normal - Echo this admit stable, EF 30-35%, IVC not dilated  - [facility] 12/4 with low pre and post capillary filling pressures, normal CO/CI, no CAD - labs not c/w low output, Lactic acid and LFTs nl  - Around baseline Cr. Holding spiro w/ elevated K, can consider restarting low dose - continue losartan 25 mg  - will give 40mg  IV lasix x1 today to help decongest prior to procedure.  - No BB for now with bradycardia - no SGLT2 w/ perineal infection    3. AKI on Stage IIIb CKD - b/l SCr ~2.0 - elevated this admit, ~3.0, 1.8 today (around baseline) - suspect due to dehydration from GI losses  - follow BMP closely.      4. Right gluteal cellulitis and wound - CT scan with no ev - GS has s/o    5. Type DM - insulin per primary team, hold currently w/ NPO status   6. New Polyarticular Pain  - left ankle and rt knee - Uric acid 10 and plain films showed: Possible 8 mm intra-articular loose body. This could be confirmed with MRI examination if indicated. No fracture or dislocation. - Suspected gout, colchicine started - On Prednisone per primary  7. Hyperkalemia - resolved   - continue to hold spiro   8. Atypical chest Pain - complaining of intt chest pain 12/4am, not reproducible. - EKG done at bedside showed SB in 67s.  - HsTrop 6 - 9' LHC demonstrating normal coronary anatomy - LHC this admit with no CAD - resolved   Length of Stay: [place], NP  05/15/2022, 8:09 AM  Advanced Heart Failure Team Pager [phone] (M-F; 7a - 5p)  Please contact [facility] for night-coverage after hours (5p -7a ) and weekends on [email or web address]   ----------------------------------------------- Addendum:  Undergoing atrial lead placement. Otherwise, euvolemic.  -Continue losartan 25mg  daily  -Start Toprol 25mg  XL tomorrow AM.  -D/C tomorrow.    Sanyiah Kanzler Advanced Heart Failure 4:22 PM

## 2022-05-15 NOTE — Progress Notes (Signed)
Electrophysiology Rounding Note  Patient Name: [name] Date of Encounter: 05/15/2022  Primary Cardiologist: [name], MD Electrophysiologist: Thompson Grayer, MD   Subjective   Laying near supine lateral decub position comfortably, slept "OK", not SOB this AM  Inpatient Medications    Scheduled Meds:  amiodarone  400 mg Oral BID   atorvastatin  10 mg Oral Daily   chlorhexidine  60 mL Topical Once   chlorhexidine  60 mL Topical Once   colchicine  0.3 mg Oral BID   fiber supplement (BANATROL TF)  60 mL Oral BID   furosemide  40 mg Intravenous Once   insulin aspart  0-9 Units Subcutaneous TID WC   insulin aspart  10 Units Subcutaneous TID WC   insulin detemir  20 Units Subcutaneous BID   losartan  25 mg Oral Daily   [START ON 05/19/2022] predniSONE  10 mg Oral Q breakfast   [START ON 05/17/2022] predniSONE  20 mg Oral Q breakfast   predniSONE  30 mg Oral Q breakfast   sodium chloride flush  3 mL Intravenous Q12H   sodium chloride flush  3 mL Intravenous Q12H   sodium chloride flush  3 mL Intravenous Q12H   tamsulosin  0.4 mg Oral Daily   Continuous Infusions:  sodium chloride     PRN Meds: sodium chloride, acetaminophen, Gerhardt's butt cream, HYDROmorphone, levalbuterol, [COMPLETED] loperamide **FOLLOWED BY** loperamide, ondansetron (ZOFRAN) IV, mouth rinse, oxyCODONE, sodium chloride flush   Vital Signs    Vitals:   05/15/22 0023 05/15/22 0354 05/15/22 0500 05/15/22 0754  BP: (!) 120/52 139/74  136/84  Pulse: 64 62  65  Resp: 19 19  16   Temp: 98.4 F (36.9 C) 98.5 F (36.9 C)  99.1 F (37.3 C)  TempSrc: Oral Oral  Oral  SpO2: 97% 99%  97%  Weight:   120.1 kg   Height:        Intake/Output Summary (Last 24 hours) at 05/15/2022 0832 Last data filed at 05/15/2022 0659 Gross per 24 hour  Intake 720 ml  Output 1300 ml  Net -580 ml   Filed Weights   05/13/22 0500 05/14/22 0633 05/15/22 0500  Weight: 120.5 kg 120.5 kg 120.1 kg    Physical Exam     GEN- The patient is tearful appearing, alert and oriented x 3 today.   HEENT- No gross abnormality.  Lungs- CTA b/l, normal work of breathing Heart- RRR,  no murmurs, rubs or gallops GI- soft, NT, ND Extremities- no clubbing or cyanosis. No edema Neuro- No obvious focal abnormality.   Labs    CBC Recent Labs    05/14/22 0130 05/15/22 0213  WBC 12.3* 11.1*  HGB 11.2* 11.6*  HCT 34.9* 34.2*  MCV 91.8 89.8  PLT 516* 294*   Basic Metabolic Panel Recent Labs    05/14/22 0130 05/15/22 0213  NA 137 137  K 4.6 4.7  CL 109 110  CO2 19* 20*  GLUCOSE 212* 272*  BUN 34* 30*  CREATININE 1.81* 1.78*  CALCIUM 8.7* 8.2*  MG 2.1 1.9   Liver Function Tests Recent Labs    05/12/22 1040  AST 18  ALT 26  ALKPHOS 58  BILITOT 0.4  PROT 6.8  ALBUMIN 2.5*   No results for input(s): "LIPASE", "AMYLASE" in the last 72 hours. Cardiac Enzymes No results for input(s): "CKTOTAL", "CKMB", "CKMBINDEX", "TROPONINI" in the last 72 hours.   Telemetry    Telemetry shows SB/NSR 50-70s mostly. Nocturnal rates to 40's, no  VT, (personally reviewed)   05/12/22: R/LHC HEMODYNAMICS: RA:                  1 mmHg (mean) RV:                  22/1-3 mmHg PA:                  23/6 mmHg (12 mean) PCWP:            4 mmHg (mean)                                      Estimated Fick CO/CI   7 L/min, 2.9 L/min/m2                                                 TPG                 8  mmHg                                              PVR                 ~1 Wood Units  PAPi                >5       IMPRESSION: Low pre and post capillary filling pressures.  Normal cardiac output/cardiac index.  Normal PVR & PA mean No obstructive CAD, left dominant w/ large Lcx supplying multiple large marginals.   Radiology     Patient Profile     [name] is a 56 y.o. male with a hx of NICM/chronic systolic CHF s/p ICD, normal coronary arteries by cath 2015, GERD, HTN and DM2 who is being seen today  for the evaluation of ventricular tachycardia/Vfib with multiple ICD shocks at the request of Dr. Gwenlyn Found.      Device information Abbot single chamber ICD implanted 12/23/2013   Assessment & Plan    1.  VT storm Pt had 22 ICD shocks and 2 ATP, 10 aborted shocks on initial arrival. Felt to be likely in setting of hypokalemia, diarrhea, and infection.  However had recurrent VT despite management and correction of both and back on amiodarone  > LHC with no obstructive CAD  Keep K > 4.0 and Mg > 2.0  Both remain improved  Pt has been started on amiodarone and has renal disease, felt to be his best option   Discussed perhaps could potentially continue to manage his acute issues and attempt to wean to mexiletine as outpatient given his young age.   Bradycardic with amiodarone Will still need/benefit from resuming BB when able for his CM and VT  His PMVT not amenable to ablation. VT/VF in the last few days appears provoked by V pacing (likely brady-mediated component), as well as spontaneous episodes of NSVT.   Much improved on amiodarone Reviewed episodes from admission, which were NOT related to V pacing.   pacing rate reduced to 35bpm to try and avoid RV pacing/triggered VT  None overnight last night, no VT   Transition to PO amiodarone 05/13/22 to  400mg  BID for 5 days then 400mg  daily after that  Narrow QRS 154ms  Planned for upgrade of his device to a dual chamber adding a A lead today with Dr. Quentin Ore Deferred yesterday due tohis inability to lay flat. In d/w HF team, not felt to be volume related or overloaded  Suspect this is mostly driven by anxiety He was able to lay at about 30 degrees this AM comfortabley though was anious/fearful about getting SOB  Dr. Quentin Ore saw him, discuss procedure in detail, alternative option would require general anesthesia and risks associated with hit. The patient feels he can do the procedure, was able to tolerate the wedge with his cath  and would like to proceed  2. Chronic systolic CHF 3. NICM Echo 01/2021 LVEF 30% -> 05/06/2022 LVEF 30-35% Home GDMT on hold with presenting diarrhea and AKI HF team is on board and managing Remains off BB with bradycardia   4. Diarrhea 5. Right gluteal cellulitis and wound Per primary  suspected due to increase in Trulicity dose out patient:  Trulicity discontinued> C. Difficile and GI penl negative.   Significantly better.     Gen surgery signed off 05/09/22 note: "No signs of abscess or indication for surgery at this time given exam more consistent with cellulitis and recent CT and u/s negative for underlying abscess. Continue antibiotics. If exam becomes more concerning for abscess development could repeat u/s and call us back. We will sign off, please call with questions or concerns." "No real signs of abscess that is drainable. May coalesce to abscess with time.  Would rec [place] to eval and can then drain at that time."  Empiric course of  zosyn finished yesterday 11/30- shows region of concern along the right buttock infiltrative subcutaneous edema no abscess or drainable process observed.  CT scan 11/27 no evidence of abscess or drainable fluid collection.   Surgery signed off, no need for procedure.   Empiric Zosyn will be continued x10 days until 12/6   I do not note any open wounds. WBC 12.3 (initially 19.5 > wobbled there > 20.2 > 18 > 15 > 11.7 > 12.3 > 11.2 Afebrile, no symptoms of illness On prednisone for gout/polyarthritis with plans to taper started yesterda  Pt reports gluteal pain, diarrhea and arthritis are all much improved     5. AKI on CKD III Baseline Cr 1.9-2.1 Cr 1.78 today.   6. Poorly controlled DM Deferred to IM CBG this AM 101 though pt reports numbers < 150 make him feel poorly and given some PO juice and cola Deferred to IM Management of BS per medicine team    For questions or updates, please contact [place] Please consult  [email or web address] for contact info under Cardiology/STEMI.  Signed, Baldwin [place], PA-C  05/15/2022, 8:32 AM

## 2022-05-15 NOTE — Progress Notes (Signed)
Mobility Specialist Progress Note:   05/15/22 1600  Mobility  Activity Off unit   Will follow-up as time allows.   Gareth Eagle Renley Gutman Mobility Specialist Please contact via [employer] or  Rehab Office at [phone]

## 2022-05-16 ENCOUNTER — Encounter (HOSPITAL_COMMUNITY): Payer: Self-pay | Admitting: Cardiology

## 2022-05-16 ENCOUNTER — Inpatient Hospital Stay (HOSPITAL_COMMUNITY): Payer: Medicaid Other

## 2022-05-16 DIAGNOSIS — I472 Ventricular tachycardia, unspecified: Secondary | ICD-10-CM | POA: Diagnosis not present

## 2022-05-16 DIAGNOSIS — E871 Hypo-osmolality and hyponatremia: Secondary | ICD-10-CM | POA: Diagnosis not present

## 2022-05-16 DIAGNOSIS — I469 Cardiac arrest, cause unspecified: Secondary | ICD-10-CM | POA: Diagnosis not present

## 2022-05-16 DIAGNOSIS — D631 Anemia in chronic kidney disease: Secondary | ICD-10-CM | POA: Diagnosis not present

## 2022-05-16 DIAGNOSIS — M10372 Gout due to renal impairment, left ankle and foot: Secondary | ICD-10-CM | POA: Diagnosis not present

## 2022-05-16 DIAGNOSIS — I428 Other cardiomyopathies: Secondary | ICD-10-CM | POA: Diagnosis not present

## 2022-05-16 DIAGNOSIS — E1122 Type 2 diabetes mellitus with diabetic chronic kidney disease: Secondary | ICD-10-CM | POA: Diagnosis not present

## 2022-05-16 DIAGNOSIS — I517 Cardiomegaly: Secondary | ICD-10-CM | POA: Diagnosis not present

## 2022-05-16 DIAGNOSIS — I77810 Thoracic aortic ectasia: Secondary | ICD-10-CM | POA: Diagnosis not present

## 2022-05-16 DIAGNOSIS — L0231 Cutaneous abscess of buttock: Secondary | ICD-10-CM | POA: Diagnosis not present

## 2022-05-16 DIAGNOSIS — I5022 Chronic systolic (congestive) heart failure: Secondary | ICD-10-CM | POA: Diagnosis not present

## 2022-05-16 DIAGNOSIS — I462 Cardiac arrest due to underlying cardiac condition: Secondary | ICD-10-CM | POA: Diagnosis not present

## 2022-05-16 DIAGNOSIS — Z95810 Presence of automatic (implantable) cardiac defibrillator: Secondary | ICD-10-CM | POA: Diagnosis not present

## 2022-05-16 DIAGNOSIS — M10361 Gout due to renal impairment, right knee: Secondary | ICD-10-CM | POA: Diagnosis not present

## 2022-05-16 DIAGNOSIS — R571 Hypovolemic shock: Secondary | ICD-10-CM | POA: Diagnosis not present

## 2022-05-16 DIAGNOSIS — N179 Acute kidney failure, unspecified: Secondary | ICD-10-CM | POA: Diagnosis not present

## 2022-05-16 DIAGNOSIS — I5042 Chronic combined systolic (congestive) and diastolic (congestive) heart failure: Secondary | ICD-10-CM | POA: Diagnosis not present

## 2022-05-16 DIAGNOSIS — N1832 Chronic kidney disease, stage 3b: Secondary | ICD-10-CM | POA: Diagnosis not present

## 2022-05-16 DIAGNOSIS — Z8616 Personal history of COVID-19: Secondary | ICD-10-CM | POA: Diagnosis not present

## 2022-05-16 DIAGNOSIS — I130 Hypertensive heart and chronic kidney disease with heart failure and stage 1 through stage 4 chronic kidney disease, or unspecified chronic kidney disease: Secondary | ICD-10-CM | POA: Diagnosis not present

## 2022-05-16 DIAGNOSIS — M6008 Infective myositis, other site: Secondary | ICD-10-CM | POA: Diagnosis not present

## 2022-05-16 LAB — CBC
HCT: 37.3 % — ABNORMAL LOW (ref 39.0–52.0)
Hemoglobin: 12.5 g/dL — ABNORMAL LOW (ref 13.0–17.0)
MCH: 30.6 pg (ref 26.0–34.0)
MCHC: 33.5 g/dL (ref 30.0–36.0)
MCV: 91.2 fL (ref 80.0–100.0)
Platelets: 549 10*3/uL — ABNORMAL HIGH (ref 150–400)
RBC: 4.09 MIL/uL — ABNORMAL LOW (ref 4.22–5.81)
RDW: 14.9 % (ref 11.5–15.5)
WBC: 10.6 10*3/uL — ABNORMAL HIGH (ref 4.0–10.5)
nRBC: 0 % (ref 0.0–0.2)

## 2022-05-16 LAB — BASIC METABOLIC PANEL
Anion gap: 10 (ref 5–15)
BUN: 31 mg/dL — ABNORMAL HIGH (ref 6–20)
CO2: 20 mmol/L — ABNORMAL LOW (ref 22–32)
Calcium: 8.7 mg/dL — ABNORMAL LOW (ref 8.9–10.3)
Chloride: 106 mmol/L (ref 98–111)
Creatinine, Ser: 1.85 mg/dL — ABNORMAL HIGH (ref 0.61–1.24)
GFR, Estimated: 42 mL/min — ABNORMAL LOW (ref >60)
Glucose, Bld: 216 mg/dL — ABNORMAL HIGH (ref 70–99)
Potassium: 4.8 mmol/L (ref 3.5–5.1)
Sodium: 136 mmol/L (ref 135–145)

## 2022-05-16 LAB — GLUCOSE, CAPILLARY
Glucose-Capillary: 124 mg/dL — ABNORMAL HIGH (ref 70–99)
Glucose-Capillary: 221 mg/dL — ABNORMAL HIGH (ref 70–99)
Glucose-Capillary: 224 mg/dL — ABNORMAL HIGH (ref 70–99)
Glucose-Capillary: 169 mg/dL — ABNORMAL HIGH (ref 70–99)
Glucose-Capillary: 104 mg/dL — ABNORMAL HIGH (ref 70–99)

## 2022-05-16 LAB — MAGNESIUM: Magnesium: 2.2 mg/dL (ref 1.7–2.4)

## 2022-05-16 MED ORDER — SACUBITRIL-VALSARTAN 24-26 MG PO TABS
1.0000 | ORAL_TABLET | Freq: Two times a day (BID) | ORAL | Status: DC
  Administered 2022-05-17: 1 via ORAL
  Filled 2022-05-16: qty 1

## 2022-05-16 NOTE — Progress Notes (Signed)
Mobility Specialist Progress Note   05/16/22 1040  Mobility  Activity Ambulated with assistance in hallway (In recliner before and after)  Level of Assistance Contact guard assist, steadying assist  Assistive Device Front wheel walker  Distance Ambulated (ft) 60 ft  Range of Motion/Exercises Active;All extremities  Activity Response Tolerated well   Pre Ambulation:  HR 68, SpO2 100% RA During Ambulation: HR 71, SpO2 100%RA Post Ambulation: HR 67, SpO2 100% RA  Patient received in recliner and agreeable to participate. Ambulated min guard with slow gait. Tolerated well with no complaint of CP or pressure. Returned to room without incident. Was left in recliner with all needs met, call bell in reach.   [place] Jahlon Baines, BS EXP Mobility Specialist Please contact via [employer] or Rehab office at [phone]

## 2022-05-16 NOTE — Discharge Instructions (Signed)
     Supplemental Discharge Instructions for  Pacemaker/Defibrillator Patients   Activity No heavy lifting or vigorous activity with your left/right arm for 6 to 8 weeks.  Do not raise your left/right arm above your head for one week.  Gradually raise your affected arm as drawn below.           __  NO DRIVING for  6 months .  WOUND CARE Keep the wound area clean and dry.  Do not get this area wet , no showers for one week; you may shower on  05/23/22   . The tape/steri-strips on your wound will fall off; do not pull them off.  No bandage is needed on the site.  DO  NOT apply any creams, oils, or ointments to the wound area. If you notice any drainage or discharge from the wound, any swelling or bruising at the site, or you develop a fever > 101? F after you are discharged home, call the office at once.  Special Instructions You are still able to use cellular telephones; use the ear opposite the side where you have your pacemaker/defibrillator.  Avoid carrying your cellular phone near your device. When traveling through airports, show security personnel your identification card to avoid being screened in the metal detectors.  Ask the security personnel to use the hand wand. Avoid arc welding equipment, MRI testing (magnetic resonance imaging), TENS units (transcutaneous nerve stimulators).  Call the office for questions about other devices. Avoid electrical appliances that are in poor condition or are not properly grounded. Microwave ovens are safe to be near or to operate.  Additional information for defibrillator patients should your device go off: If your device goes off ONCE and you feel fine afterward, notify the device clinic nurses. If your device goes off ONCE and you do not feel well afterward, call 911. If your device goes off TWICE, call 911. If your device goes off THREE times in one day, call 911.  DO NOT DRIVE YOURSELF OR A FAMILY MEMBER WITH A DEFIBRILLATOR TO THE  HOSPITAL--CALL 911.

## 2022-05-16 NOTE — TOC CM/SW Note (Addendum)
HF TOC CM contacted [place], Erasmo Downer. Will deliver Rollator to room. Meds in [place] for dc.   [place], Heart Failure TOC CM [phone]

## 2022-05-16 NOTE — Progress Notes (Addendum)
Advanced Heart Failure Rounding Note  PCP-Cardiologist: [name], MD   Subjective:    Had recurrent VT 12/4, treated w/ ICD shock. Triggered by intermittent V Pacing. Started on amio gtt. Cath snowed no obstructive CAD.   Underwent successful RA lead addition yesterday + gen change.   CXR pending.   A-paced 60s. No further VT.  SBPs 120s-low 140s  Feeling better. No dyspnea.    _____________________________________ Cardiac Studies  R/LHC 12/4  RA:                  1 mmHg (mean) RV:                  22/1-3 mmHg PA:                  23/6 mmHg (12 mean) PCWP:            4 mmHg (mean)    Estimated Fick CO/CI   7 L/min, 2.9 L/min/m2            TPG                 8  mmHg                                              PVR                 ~1 Wood Units  PAPi                >5    IMPRESSION: Low pre and post capillary filling pressures.  Normal cardiac output/cardiac index.  Normal PVR & PA mean No obstructive CAD, left dominant w/ large Lcx supplying multiple large marginals.   Echo 11/28 EF 30-35%, RV normal.   Objective:   Weight Range: 117.4 kg Body mass index is 35.1 kg/m.   Vital Signs:   Temp:  [97.9 F (36.6 C)-99.1 F (37.3 C)] 97.9 F (36.6 C) (12/08 0015) Pulse Rate:  [0-81] 60 (12/08 0015) Resp:  [16-40] 20 (12/08 0015) BP: (109-141)/(70-90) 141/78 (12/08 0015) SpO2:  [92 %-98 %] 96 % (12/08 0015) Weight:  [117.4 kg] 117.4 kg (12/08 0607) Last BM Date :  (PTA)  Weight change: Filed Weights   05/14/22 0633 05/15/22 0500 05/16/22 0607  Weight: 120.5 kg 120.1 kg 117.4 kg    Intake/Output:   Intake/Output Summary (Last 24 hours) at 05/16/2022 0727 Last data filed at 05/16/2022 0616 Gross per 24 hour  Intake 820.91 ml  Output --  Net 820.91 ml     Physical Exam    General:  Well appearing. No respiratory difficulty HEENT: normal Neck: supple. no JVD. Carotids 2+ bilat; no bruits. No lymphadenopathy or thyromegaly appreciated. Cor: PMI  nondisplaced. Regular rate & rhythm. No rubs, gallops or murmurs. Lungs: clear Abdomen: soft, nontender, nondistended. No hepatosplenomegaly. No bruits or masses. Good bowel sounds. Extremities: no cyanosis, clubbing, rash, edema Neuro: alert & oriented x 3, cranial nerves grossly intact. moves all 4 extremities w/o difficulty. Affect pleasant.   Telemetry   A-paced 60s. No further VT/ NSVT  (Personally reviewed)    EKG    No new EKG to review  Labs    CBC Recent Labs    05/15/22 0213 05/16/22 0150  WBC 11.1* 10.6*  HGB 11.6* 12.5*  HCT 34.2* 37.3*  MCV 89.8  91.2  PLT 551* 694*   Basic Metabolic Panel Recent Labs    05/15/22 0213 05/16/22 0150  NA 137 136  K 4.7 4.8  CL 110 106  CO2 20* 20*  GLUCOSE 272* 216*  BUN 30* 31*  CREATININE 1.78* 1.85*  CALCIUM 8.2* 8.7*  MG 1.9 2.2   Liver Function Tests No results for input(s): "AST", "ALT", "ALKPHOS", "BILITOT", "PROT", "ALBUMIN" in the last 72 hours.  No results for input(s): "LIPASE", "AMYLASE" in the last 72 hours. Cardiac Enzymes No results for input(s): "CKTOTAL", "CKMB", "CKMBINDEX", "TROPONINI" in the last 72 hours.  BNP: BNP (last 3 results) Recent Labs    02/01/22 1702 05/03/22 2010 05/06/22 1736  BNP 11.8 350.8* 487.7*    ProBNP (last 3 results) Recent Labs    01/09/22 1002 03/21/22 1452  PROBNP 97 18.0     D-Dimer No results for input(s): "DDIMER" in the last 72 hours. Hemoglobin A1C No results for input(s): "HGBA1C" in the last 72 hours. Fasting Lipid Panel No results for input(s): "CHOL", "HDL", "LDLCALC", "TRIG", "CHOLHDL", "LDLDIRECT" in the last 72 hours. Thyroid Function Tests No results for input(s): "TSH", "T4TOTAL", "T3FREE", "THYROIDAB" in the last 72 hours.  Invalid input(s): "FREET3"   Other results:   Imaging    EP PPM/ICD IMPLANT  Result Date: 05/15/2022  CONCLUSIONS:  1. Successful RA lead addition  2. Successful generator replacement  3. ICD pocket  revision     Medications:     Scheduled Medications:  amiodarone  400 mg Oral BID   atorvastatin  10 mg Oral Daily   colchicine  0.3 mg Oral BID   fiber supplement (BANATROL TF)  60 mL Oral BID   insulin aspart  0-9 Units Subcutaneous TID WC   insulin aspart  14 Units Subcutaneous TID WC   insulin detemir  20 Units Subcutaneous BID   losartan  25 mg Oral Daily   [START ON 05/19/2022] predniSONE  10 mg Oral Q breakfast   [START ON 05/17/2022] predniSONE  20 mg Oral Q breakfast   predniSONE  30 mg Oral Q breakfast   sodium chloride flush  3 mL Intravenous Q12H   sodium chloride flush  3 mL Intravenous Q12H   sodium chloride flush  3 mL Intravenous Q12H   tamsulosin  0.4 mg Oral Daily    Infusions:  sodium chloride      ceFAZolin (ANCEF) IV 2 g (05/16/22 0353)    PRN Medications: sodium chloride, acetaminophen, Gerhardt's butt cream, HYDROmorphone, levalbuterol, [COMPLETED] loperamide **FOLLOWED BY** loperamide, ondansetron (ZOFRAN) IV, mouth rinse, oxyCODONE, sodium chloride flush, sodium chloride flush    Patient Profile   56 y/o AAM w/ h/o chronic systolic heart failure due to NICM, s/p ICD, admitted w/ VT storm treated w/ multiple ICD shocks w/ witnessed VT arrest while in ED treated w/ CPR and defib, all in setting of metabolic derangement/hypokalemia from GI loses. AHF team consulted for assistance w/ med management in setting of worsening renal function and soft BP, concerning for potential low output HF   Assessment/Plan   1. VT Storm - had 22 ICD shocks and 2 ATP, 10 aborted shocks - in setting of hypokalemia from GI loses, initial K 2.8 (suspected etiology) - loaded w/ IV amiodarone, K corrected - VT had been quiescent since 11/25 w/ repletion of K but w/ recurrence 12/4 treated w/ ICD shock. Event triggered by intermittent V Pacing - LHC w/ nonobstructive CAD  - S/p placement of an atrial lead 12/7 to  help prevent V pacing and allow [place] to add appropriate medical  therapy (AAD and BB) without inciting NSVT/VT episodes.  - Continue PO amio 400 mg bid per EP - Add Toprol XL 25 mg daily   2. Chronic Systolic Heart Failure  - NICM - Echo 07/2013 EF 20-25%. RV normal>> LHC normal cors. Repeat Echo 5/15 EF 30-35% - s/p ICD 7/15 - Echo 8/22, EF 30%, RV normal - Echo this admit stable, EF 30-35%, IVC not dilated  - [facility] 12/4 with low pre and post capillary filling pressures, normal CO/CI, no CAD - labs not c/w low output, Lactic acid and LFTs nl  - Around baseline Cr. Holding spiro w/ elevated K, can consider restarting low dose - continue losartan 25 mg  - Add Toprol XL 25 mg daily today  - no SGLT2 w/ perineal infection  - c/w some diarrhea. Does not need standing dose loop diuretic currently. Will need PRN Lasix at d/c    3. AKI on Stage IIIb CKD - b/l SCr ~2.0 - elevated this admit, ~3.0, 1.9 today (around baseline) - suspect due to dehydration from GI losses  - follow BMP closely.      4. Right gluteal cellulitis and wound - CT scan with no ev - GS has s/o    5. Type DM - insulin per primary team  6. New Polyarticular Pain  - left ankle and rt knee - Uric acid 10 and plain films showed: Possible 8 mm intra-articular loose body. This could be confirmed with MRI examination if indicated. No fracture or dislocation. - Suspected gout, colchicine started - On Prednisone per primary  7. Hyperkalemia - resolved  - continue to hold spiro   8. Atypical chest Pain - complaining of intt chest pain 12/4am, not reproducible. - EKG done at bedside showed SB in 75s.  - HsTrop 6 - 46' LHC demonstrating normal coronary anatomy - LHC this admit with no CAD - resolved   Length of Stay: [address] PA-C  05/16/2022, 7:27 AM  Advanced Heart Failure Team Pager [phone] (M-F; 7a - 5p)  Please contact [facility] for night-coverage after hours (5p -7a ) and weekends on [email or web address]   Patient seen with NP, agree with the above note.    Subjective: - No complaints; no reported SOB, CP; still having diarrhea.    Exam: General: NAD HEENT: Normal.  Neck: Thick neck. No JVD, no thyromegaly or thyroid nodule.  Lungs: Clear to auscultation bilaterally with normal respiratory effort. CV: Nondisplaced PMI.  Heart regular S1/S2, no S3/S4, no murmur.  No peripheral edema.    Abdomen: Soft, nontender, no hepatosplenomegaly, no distention.  Skin: Intact without lesions or rashes.  Neurologic: awake/alert, no gross FND.  Psych: Normal affect. Extremities: No clubbing or cyanosis.    A/P - Stable for discharge from a cardiac standpoint. No significant NSVT on telemetry after atrial lead placement; brief periods of atrial pacing.  - Lasix prn at discharge - Start Entresto 24/26mg  BID   [name] Advanced Heart Failure

## 2022-05-16 NOTE — Progress Notes (Signed)
Progress Note   Patient: [name]:[id number] DOB: [birth year] DOA: 05/03/2022     13 DOS: the patient was seen and examined on 05/16/2022   Brief hospital course: 56 year old man chronic systolic CHF, ICM, status post ICD presented to ED with history of diarrhea, ICD shock, in the emergency department had witnessed V. tach arrest, underwent CPR with ROSC with 1 shock with return to baseline neurologic status, admitted to ICU.  Stabilized.  Also developed buttocks abscess/cellulitis and seen by general surgery.  Transferred to hospitalist service 11/30.  12/5 had VT storm, left heart catheter showed no CAD.  Further management per electrophysiology.  Consultants Cardiology Advanced heart failure Electrophysiology  General surgery   Procedures 12/4 R/LHC IMPRESSION: Low pre and post capillary filling pressures.  Normal cardiac output/cardiac index.  Normal PVR & PA mean No obstructive CAD, left dominant w/ large Lcx supplying multiple large marginals.   Assessment and Plan: VT storm had 22 ICD shocks and 2 ATP, 10 aborted shocks, in setting of hypokalemia from GI loses, initial K 2.8 (suspected etiology) LHC w/ nonobstructive CAD   Continue management as per cardiology/electrophysiology including amiodarone. S/p device upgrade 12/7 Continue PO amio 400 mg bid per EP Add Toprol XL 25 mg daily  No driving 6 months    Chronic systolic CHF-EF 81-19% this admission similar to previous NICM w ICD in place: Demand cardiac ischemia in the setting of frequent AICD firing  Chest pain: LVEF 30-35%.   Continue management per advanced heart failure team. Does not need standing dose loop diuretic currently. Will need PRN Lasix at d/c      Type 2 diabetes mellitus with uncontrolled hyperglycemia:  CBG high.  Continue Levemir, meal coverage, sliding scale insulin Increasing Novolog to 14 units TID    Cellulitis of the buttock CT scan 11/27 no evidence of abscess or drainable fluid  collection.  Surgery signed off, no need for procedure.  Treated with empiric antibiotics.   Leukocytosis:  Multifactorial in the setting of infection,   Polyarthritis  Right knee pain Left foot/right foot left hand swelling and pain History of plantar fasciitis Large foreign body on right knee asymptomatic Complained of pain mostly on his heels also mild ankle pain and swelling> was placed on colchicine in ICU but did not improve, prednisone added 12/1 dose further increased currently on 40 mg twice daily with improvement.     AKI on CKD IIIb: b/l creatinine close to 2.3 back in August.  Appears to be at baseline at this point.   Hyponatremia: Resolved.   Class II Obesity:Patient's Body mass index is 36.03 kg/m. : Will benefit with PCP follow-up, weight loss  healthy lifestyle and outpatient sleep evaluation.   Hypovolemic shock Resolved  Diarrhea, likely medication induced Resolved. Patient dose of trulicity increased on 14/78. Reported since increase having diarrhea daily up to 20 episodes per day. Trulicity has been stopped       Subjective:  Feels ok today Trying to get ready for discharge  Physical Exam: Vitals:   05/16/22 0607 05/16/22 0746 05/16/22 1140 05/16/22 1524  BP:  137/82 132/89 124/83  Pulse:  63 64 71  Resp:  17 19 16   Temp:  98 F (36.7 C) (!) 97.5 F (36.4 C) 97.9 F (36.6 C)  TempSrc:  Oral Oral Oral  SpO2:  95% 96% 99%  Weight: 117.4 kg     Height:       Physical Exam Vitals reviewed.  Constitutional:  General: He is not in acute distress.    Appearance: He is not ill-appearing or toxic-appearing.  Cardiovascular:     Rate and Rhythm: Normal rate and regular rhythm.     Heart sounds: No murmur heard. Pulmonary:     Effort: Pulmonary effort is normal. No respiratory distress.     Breath sounds: No wheezing, rhonchi or rales.  Neurological:     Mental Status: He is alert.  Psychiatric:        Mood and Affect: Mood normal.         Behavior: Behavior normal.     Data Reviewed: CBG labile, fasting 124 today CXR no PTX  Family Communication: none  Disposition: Status is: Inpatient Remains inpatient appropriate because: VT, s/p pacemaker upgrade  Planned Discharge Destination: Home    Time spent: 20 minutes  Author: Murray Hodgkins, MD 05/16/2022 5:16 PM  For on call review [email or web address].

## 2022-05-16 NOTE — TOC Transition Note (Signed)
Discharge medications (2) are being stored in the main pharmacy on the ground floor until patient is ready for discharge.   

## 2022-05-16 NOTE — Progress Notes (Addendum)
Electrophysiology Rounding Note  Patient Name: [name] Date of Encounter: 05/16/2022  Primary Cardiologist: [name], MD Electrophysiologist: Thompson Grayer, MD   Subjective   Minimal site pain, tylenol helped, felt a twitching sensation at the devic pocket, no heart palpitations, no CP.  He feels nauseous this morning, weak, and drained, wonderrs if his insulin dose is too high.  Very worried about the the device and what he felt  Inpatient Medications    Scheduled Meds:  amiodarone  400 mg Oral BID   atorvastatin  10 mg Oral Daily   colchicine  0.3 mg Oral BID   fiber supplement (BANATROL TF)  60 mL Oral BID   insulin aspart  0-9 Units Subcutaneous TID WC   insulin aspart  14 Units Subcutaneous TID WC   insulin detemir  20 Units Subcutaneous BID   losartan  25 mg Oral Daily   [START ON 05/19/2022] predniSONE  10 mg Oral Q breakfast   [START ON 05/17/2022] predniSONE  20 mg Oral Q breakfast   sodium chloride flush  3 mL Intravenous Q12H   sodium chloride flush  3 mL Intravenous Q12H   sodium chloride flush  3 mL Intravenous Q12H   tamsulosin  0.4 mg Oral Daily   Continuous Infusions:  sodium chloride     PRN Meds: sodium chloride, acetaminophen, Gerhardt's butt cream, HYDROmorphone, levalbuterol, [COMPLETED] loperamide **FOLLOWED BY** loperamide, ondansetron (ZOFRAN) IV, mouth rinse, oxyCODONE, sodium chloride flush, sodium chloride flush   Vital Signs    Vitals:   05/15/22 2027 05/16/22 0015 05/16/22 0607 05/16/22 0746  BP: (!) 139/90 (!) 141/78  137/82  Pulse: 81 60  63  Resp: 18 20  17   Temp:  97.9 F (36.6 C)  98 F (36.7 C)  TempSrc:  Oral  Oral  SpO2: 92% 96%  95%  Weight:   117.4 kg   Height:        Intake/Output Summary (Last 24 hours) at 05/16/2022 0913 Last data filed at 05/16/2022 0616 Gross per 24 hour  Intake 820.91 ml  Output --  Net 820.91 ml   Filed Weights   05/14/22 0633 05/15/22 0500 05/16/22 0607  Weight: 120.5 kg 120.1  kg 117.4 kg    Physical Exam    GEN- The patient is tearful appearing, alert and oriented x 3 today.   HEENT- No gross abnormality.  Lungs- CTA b/l, normal work of breathing Heart- RRR,  no murmurs, rubs or gallops GI- soft, NT, ND Extremities- no clubbing or cyanosis. No edema Neuro- No obvious focal abnormality.   Labs    CBC Recent Labs    05/15/22 0213 05/16/22 0150  WBC 11.1* 10.6*  HGB 11.6* 12.5*  HCT 34.2* 37.3*  MCV 89.8 91.2  PLT 551* 409*   Basic Metabolic Panel Recent Labs    05/15/22 0213 05/16/22 0150  NA 137 136  K 4.7 4.8  CL 110 106  CO2 20* 20*  GLUCOSE 272* 216*  BUN 30* 31*  CREATININE 1.78* 1.85*  CALCIUM 8.2* 8.7*  MG 1.9 2.2   Liver Function Tests No results for input(s): "AST", "ALT", "ALKPHOS", "BILITOT", "PROT", "ALBUMIN" in the last 72 hours.  No results for input(s): "LIPASE", "AMYLASE" in the last 72 hours. Cardiac Enzymes No results for input(s): "CKTOTAL", "CKMB", "CKMBINDEX", "TROPONINI" in the last 72 hours.   Telemetry    Telemetry shows SB/NSR 50-70s mostly. Nocturnal rates to 40's, no VT, (personally reviewed)   05/12/22: R/LHC HEMODYNAMICS: RA:  1 mmHg (mean) RV:                  22/1-3 mmHg PA:                  23/6 mmHg (12 mean) PCWP:            4 mmHg (mean)                                      Estimated Fick CO/CI   7 L/min, 2.9 L/min/m2                                                 TPG                 8  mmHg                                              PVR                 ~1 Wood Units  PAPi                >5       IMPRESSION: Low pre and post capillary filling pressures.  Normal cardiac output/cardiac index.  Normal PVR & PA mean No obstructive CAD, left dominant w/ large Lcx supplying multiple large marginals.   Radiology     Patient Profile     GERLAD PELZEL is a 56 y.o. male with a hx of NICM/chronic systolic CHF s/p ICD, normal coronary arteries by cath 2015, GERD, HTN and  DM2 who is being seen today for the evaluation of ventricular tachycardia/Vfib with multiple ICD shocks at the request of Dr. Gwenlyn Found.      Device information Abbot single chamber ICD implanted 12/23/2013   Assessment & Plan    1.  VT storm Pt had 22 ICD shocks and 2 ATP, 10 aborted shocks on initial arrival. Felt to be likely in setting of hypokalemia, diarrhea, and infection.  However had recurrent VT despite management and correction of both and back on amiodarone  > LHC with no obstructive CAD  Keep K > 4.0 and Mg > 2.0  Both remain improved  Pt has been started on amiodarone and has renal disease, felt to be his best option   Discussed perhaps could potentially continue to manage his acute issues and attempt to wean to mexiletine as outpatient given his young age.   Bradycardic with amiodarone Will still need/benefit from resuming BB when able for his CM and VT  His PMVT not amenable to ablation. VT/VF in the last few days appears provoked by V pacing (likely brady-mediated component), as well as spontaneous episodes of NSVT.   Much improved on amiodarone Reviewed episodes from admission, which were NOT related to V pacing.   No NSVT in 48hours, PVC burden is much better  Transitioned to PO amiodarone 05/13/22 to 400mg  BID for 5 days then 400mg  daily after that  Now s/p device upgrade to a dual chamber device given his bradycardia with amiodarone The patient was seen this AM by Dr. Quentin Ore CXR reviewed with  stable lead placement, read with out ptx Device check this morning with stable measurements Wound is stable Wound care and activity restrictions were reviewed with the patient F/u with EP is in place   Unclear what he felt, in d/w inustry he had some phrenic on the chronic RV lead at higher outputs though not at base pacing outputs, no other findings or mentions by the patient to her.  He tells me more of a sense of something at the pocket it'self The pocket looks  GREAT, site is stable He is quite anxious, industry has rechecked the device now this AM and again, find stable measurements. Pt was reassured everything looks ok  I have advised the patient no driving 6 months    2. Chronic systolic CHF 3. NICM Echo 01/2021 LVEF 30% -> 05/06/2022 LVEF 30-35% Home GDMT on hold with presenting diarrhea and AKI HF team is on board and managing   OK to start betablocker of HF team's choice   4. Diarrhea 5. Right gluteal cellulitis and wound Per primary  suspected due to increase in Trulicity dose out patient:  Trulicity discontinued> C. Difficile and GI penl negative.   Significantly better.     Gen surgery signed off 05/09/22 note: "No signs of abscess or indication for surgery at this time given exam more consistent with cellulitis and recent CT and u/s negative for underlying abscess. Continue antibiotics. If exam becomes more concerning for abscess development could repeat u/s and call us back. We will sign off, please call with questions or concerns." "No real signs of abscess that is drainable. May coalesce to abscess with time.  Would rec [place] to eval and can then drain at that time."  Empiric course of  zosyn finished 12/6 11/30- shows region of concern along the right buttock infiltrative subcutaneous edema no abscess or drainable process observed.  CT scan 11/27 no evidence of abscess or drainable fluid collection.   Surgery signed off, no need for procedure.   Empiric Zosyn will be continued x10 days until 12/6   I do not note any open wounds. WBC 12.3 (initially 19.5 > wobbled there > 20.2 > 18 > 15 > 11.7 > 12.3 > 11.2 Afebrile, no symptoms of illness On prednisone for gout/polyarthritis with plans to taper started 05/14/22  Pt reports gluteal pain, diarrhea and arthritis are all much improved     5. AKI on CKD III Baseline Cr 1.9-2.1 stable  6. Poorly controlled DM Deferred to IM  7. Anxiety This is significant Management  deferred to IM team    EP service will sign off though remain available Please recall if needed.   For questions or updates, please contact [place] Please consult [email or web address] for contact info under Cardiology/STEMI.  Signed, Baldwin [place], PA-C  05/16/2022, 9:13 AM

## 2022-05-17 DIAGNOSIS — I5042 Chronic combined systolic (congestive) and diastolic (congestive) heart failure: Secondary | ICD-10-CM | POA: Diagnosis not present

## 2022-05-17 DIAGNOSIS — L0231 Cutaneous abscess of buttock: Secondary | ICD-10-CM | POA: Diagnosis not present

## 2022-05-17 DIAGNOSIS — I428 Other cardiomyopathies: Secondary | ICD-10-CM | POA: Diagnosis not present

## 2022-05-17 DIAGNOSIS — I469 Cardiac arrest, cause unspecified: Secondary | ICD-10-CM | POA: Diagnosis not present

## 2022-05-17 DIAGNOSIS — N179 Acute kidney failure, unspecified: Secondary | ICD-10-CM | POA: Diagnosis not present

## 2022-05-17 DIAGNOSIS — N1832 Chronic kidney disease, stage 3b: Secondary | ICD-10-CM | POA: Diagnosis not present

## 2022-05-17 DIAGNOSIS — I472 Ventricular tachycardia, unspecified: Secondary | ICD-10-CM | POA: Diagnosis not present

## 2022-05-17 LAB — BASIC METABOLIC PANEL
Anion gap: 7 (ref 5–15)
BUN: 30 mg/dL — ABNORMAL HIGH (ref 6–20)
CO2: 21 mmol/L — ABNORMAL LOW (ref 22–32)
Calcium: 8.7 mg/dL — ABNORMAL LOW (ref 8.9–10.3)
Chloride: 109 mmol/L (ref 98–111)
Creatinine, Ser: 1.85 mg/dL — ABNORMAL HIGH (ref 0.61–1.24)
GFR, Estimated: 42 mL/min — ABNORMAL LOW (ref >60)
Glucose, Bld: 133 mg/dL — ABNORMAL HIGH (ref 70–99)
Potassium: 4.6 mmol/L (ref 3.5–5.1)
Sodium: 137 mmol/L (ref 135–145)

## 2022-05-17 LAB — GLUCOSE, CAPILLARY
Glucose-Capillary: 112 mg/dL — ABNORMAL HIGH (ref 70–99)
Glucose-Capillary: 200 mg/dL — ABNORMAL HIGH (ref 70–99)

## 2022-05-17 LAB — CBC
HCT: 34.4 % — ABNORMAL LOW (ref 39.0–52.0)
Hemoglobin: 11.6 g/dL — ABNORMAL LOW (ref 13.0–17.0)
MCH: 30.6 pg (ref 26.0–34.0)
MCHC: 33.7 g/dL (ref 30.0–36.0)
MCV: 90.8 fL (ref 80.0–100.0)
Platelets: 486 10*3/uL — ABNORMAL HIGH (ref 150–400)
RBC: 3.79 MIL/uL — ABNORMAL LOW (ref 4.22–5.81)
RDW: 14.9 % (ref 11.5–15.5)
WBC: 10.3 10*3/uL (ref 4.0–10.5)
nRBC: 0 % (ref 0.0–0.2)

## 2022-05-17 LAB — MAGNESIUM: Magnesium: 2.1 mg/dL (ref 1.7–2.4)

## 2022-05-17 MED ORDER — METOPROLOL SUCCINATE ER 25 MG PO TB24: 25.0000 mg | ORAL_TABLET | Freq: Every day | ORAL | 2 refills | Status: DC

## 2022-05-17 MED ORDER — AMIODARONE HCL 400 MG PO TABS: 400.0000 mg | ORAL_TABLET | Freq: Every day | ORAL | 2 refills | Status: DC

## 2022-05-17 MED ORDER — METOPROLOL SUCCINATE ER 25 MG PO TB24
25.0000 mg | ORAL_TABLET | Freq: Every day | ORAL | Status: DC
  Administered 2022-05-17: 25 mg via ORAL
  Filled 2022-05-17: qty 1

## 2022-05-17 MED ORDER — PREDNISONE 10 MG PO TABS: 10.0000 mg | ORAL_TABLET | Freq: Every day | ORAL | 0 refills | Status: DC

## 2022-05-17 MED ORDER — FUROSEMIDE 80 MG PO TABS: 80.0000 mg | ORAL_TABLET | Freq: Every day | ORAL | 1 refills | Status: DC | PRN

## 2022-05-17 NOTE — Discharge Summary (Signed)
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  Follow-up as an outpatient.  Consider prophylactic therapy.    Discharge Diagnoses: Principal Problem:   Cardiac arrest ([facility] Problems:   AKI (acute kidney injury) (HCLeming  V-tach (HCPanola  Hypokalemia   Rectal abscess   Left buttock abscess   Diarrhea   Hyperglycemia   Cellulitis of buttock   Chronic HFrEF (heart failure with reduced ejection fraction) (HCC)   Acute renal failure superimposed on stage 3b chronic kidney disease (HCC)   Acute gout due to renal impairment involving left ankle   Acute gout due to renal impairment involving right knee   Hyponatremia   Stage 3b chronic kidney disease (HCSims Resolved Problems:   * No resolved hospital problems. *  Hospital Course: [age]ear old man chronic systolic CHF, ICM, status post ICD presented to ED with history of diarrhea, ICD shock, in the emergency department had witnessed V. tach arrest, underwent CPR with ROSC with 1 shock with return to baseline neurologic status, admitted to ICU.  Stabilized.  Also developed buttocks abscess/cellulitis and seen by general surgery.  Transferred to hospitalist service 11/30.  12/5 had VT storm, left heart cath  showed no CAD.  Subsequently underwent upgrade with atrial lead placement.   Cleared for discharge.  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   Continue management per advanced heart failure team. Does not need standing dose loop diuretic currently. Will need PRN Lasix at d/c   Entresto 24/26mg BI59m  Type 2 diabetes mellitus with uncontrolled hyperglycemia:    Cellulitis of the buttock CT scan 11/27 no evidence of abscess or drainable fluid collection.  Surgery signed off, no need for procedure.  Treated with empiric antibiotics.  Polyarthritis  Right knee pain Left foot/right foot left hand swelling and pain History of plantar fasciitis Large foreign body on right knee asymptomatic --Treated for presumptive gout.  Follow-up as an outpatient.  Consider prophylactic therapy.   AKI on CKD IIIb: b/l creatinine close to 2.3 back in August.  Appears to be at baseline at this point.  Hyponatremia: Resolved.   Class II Obesity:Patient's Body mass index is 36.03 kg/m. : Will benefit with PCP follow-up, weight loss  healthy lifestyle and outpatient sleep evaluation.   Hypovolemic shock Resolved   Diarrhea, likely medication induced Resolved. Patient dose of trulicity increased on 99/83. Reported since increase having diarrhea daily up to 20 episodes per day. Trulicity has been stopped   Mobility Level: 5 Movement: 51f  Disposition   Home   Consultants Cardiology Advanced heart failure Electrophysiology  General surgery   Procedures 12/4 R/LHC IMPRESSION: Low pre and post capillary filling pressures.  Normal cardiac output/cardiac index.  Normal PVR & PA mean No obstructive CAD, left dominant w/ large Lcx supplying multiple large marginals.   Disposition: Home Diet recommendation:  Cardiac and Carb modified diet DISCHARGE MEDICATION: Allergies as of 05/17/2022       Reactions   Bee Venom Anaphylaxis   Trulicity [dulaglutide] Diarrhea   Patient experience onset of diarrhea with using this medication - cleared after med stopped.        Medication List     STOP taking these medications    carvedilol 6.25 MG tablet Commonly known as: COREG   doxycycline 100 MG tablet Commonly known as: VIBRA-TABS   naproxen 500 MG tablet Commonly known as: NAPROSYN   potassium chloride 10 MEQ tablet Commonly known as: KLOR-CON   Trulicity 1.5 MJA/2.5KNSopn Generic drug: Dulaglutide       TAKE these medications    Accu-Chek Guide test strip Generic drug: glucose blood Use as instructed   Accu-Chek Guide w/Device Kit UAD   accu-chek softclix lancets 1 each by Other route 3 (three) times daily.   Accu-Chek Softclix Lancets lancets Use as instructed   albuterol 108 (90 Base) MCG/ACT inhaler Commonly known as: VENTOLIN HFA Inhale into the lungs every 6 (six) hours as needed for wheezing or shortness of breath.   amiodarone 400 MG tablet Commonly known as: PACERONE Take 1 tablet (400 mg total) by mouth daily.   aspirin 81 MG tablet Take 81 mg by mouth daily.   atorvastatin 10 MG tablet Commonly known as: LIPITOR Take 1 tablet (10 mg total) by mouth daily.   cetirizine 10 MG tablet Commonly known as: ZYRTEC Take 1 tablet (10 mg total) by mouth daily.   chlorpheniramine-HYDROcodone 10-8 MG/5ML Commonly known as: TUSSIONEX Take 5 mLs by mouth every 12 (twelve) hours as needed for  cough.   clobetasol ointment 0.05 % Commonly known as: TEMOVATE Apply 1 Application topically 2 (two) times daily.   dapagliflozin propanediol 5 MG Tabs tablet Commonly known as: Farxiga Take 1 tablet (5 mg total) by mouth daily before breakfast.   Entresto 24-26 MG Generic drug: sacubitril-valsartan Take 1 tablet by mouth 2 (two) times daily.   EPINEPHrine 0.3 mg/0.3 mL Soaj injection Commonly known as: EPI-PEN Inject 0.3 mg into the muscle as needed.   famotidine 20 MG tablet Commonly known as: PEPCID Take 1 tablet by mouth 1-2 times a day as needed. What changed:  how much to take how to take this when to take this additional instructions   fluticasone 50 MCG/ACT nasal spray Commonly known as: FLONASE Use 1-2 sprays in each nostril once a day as needed for nasal congestion.   FreeStyle Libre 2 Reader DKerrin MoUse to check blood sugar TID.  E11.65   FreeStyle Libre 2 Sensor Misc Use to check blood sugar TID. Change sensor once every 14 days. E11.65   furosemide 80 MG tablet Commonly known as:  LASIX Take 1 tablet (80 mg total) by mouth daily as needed for edema or fluid. What changed:  when to take this reasons to take this   gabapentin 300 MG capsule Commonly known as: NEURONTIN TAKE 1 CAPSULE BY MOUTH EVERY DAY AT BEDTIME What changed: See the new instructions.   ipratropium-albuterol 0.5-2.5 (3) MG/3ML Soln Commonly known as: DUONEB Take 3 mLs by nebulization every 6 (six) hours as needed. What changed: reasons to take this   Lantus SoloStar 100 UNIT/ML Solostar Pen Generic drug: insulin glargine Inject 30 Units into the skin daily.   methocarbamol 500 MG tablet Commonly known as: ROBAXIN TAKE 1 TABLET BY MOUTH ONCE DAILY AS NEEDED FOR MUSCLE SPASM What changed:  how much to take how to take this when to take this reasons to take this additional instructions   metoprolol succinate 25 MG 24 hr tablet Commonly known as: TOPROL-XL Take 1 tablet (25  mg total) by mouth daily. Start taking on: May 18, 2022   mometasone 0.1 % cream Commonly known as: Elocon Apply 1 application topically daily.   NovoLOG FlexPen 100 UNIT/ML FlexPen Generic drug: insulin aspart Inject 10 Units into the skin 2 (two) times daily before a meal.   Pen Needles 31G X 8 MM Misc UAD   potassium chloride SA 20 MEQ tablet Commonly known as: KLOR-CON M Take 1 tablet (20 mEq total) by mouth 2 (two) times daily.   predniSONE 10 MG tablet Commonly known as: DELTASONE Take 1 tablet (10 mg total) by mouth daily with breakfast. Start taking on: May 19, 2022   promethazine-dextromethorphan 6.25-15 MG/5ML syrup Commonly known as: PROMETHAZINE-DM Take 5 mLs by mouth 4 (four) times daily as needed for cough.   sildenafil 100 MG tablet Commonly known as: VIAGRA Take 1-2 tabs PO 1/2-1 hr prior to intercourse.   spironolactone 25 MG tablet Commonly known as: ALDACTONE Take 1 tablet (25 mg total) by mouth daily.   tamsulosin 0.4 MG Caps capsule Commonly known as: FLOMAX Take 2 capsules (0.8 mg total) by mouth daily.   triamcinolone cream 0.1 % Commonly known as: KENALOG Apply sparingly to red itchy areas twice daily as needed. Avoid face, neck, armpits or groin area. Do not use more than 3 weeks in a row.               Durable Medical Equipment  (From admission, onward)           Start     Ordered   05/15/22 1216  For home use only DME 4 wheeled rolling walker with seat  Once       Question:  Patient needs a walker to treat with the following condition  Answer:  Heart failure ([place])   05/15/22 1216   05/15/22 1216  For home use only DME 3 n 1  Once        05/15/22 1216            Follow-up Information     [facility] Follow up on 05/26/2022.   Specialty: Cardiology Why: Follow-up in the [facility] 05/26/22 at 10:40 PM Entrance C, free valet Please bring all  medications with you Contact information: [address] [id number] [place]               [place]  Discharge Exam: Filed Weights   05/15/22 0500 05/16/22 0607 05/17/22 0425  Weight: 120.1 kg 117.4 kg  117.5 kg   Physical Exam Vitals reviewed.  Constitutional:      General: He is not in acute distress.    Appearance: He is not ill-appearing or toxic-appearing.  Cardiovascular:     Rate and Rhythm: Normal rate and regular rhythm.     Heart sounds: No murmur heard.    Comments: Telemetry paced rhythm Neurological:     Mental Status: He is alert.  Psychiatric:        Mood and Affect: Mood normal.      Condition at discharge: good  The results of significant diagnostics from this hospitalization (including imaging, microbiology, ancillary and laboratory) are listed below for reference.   Imaging Studies: DG Chest 2 View  Result Date: 05/16/2022 CLINICAL DATA:  Status post pacemaker EXAM: CHEST - 2 VIEW COMPARISON:  Chest radiograph dated 05/14/2022 FINDINGS: Lines/tubes: Left chest wall ICD leads project over the right atrium and ventricle. Chest: Low lung volumes. No focal consolidations. Pleura: No pneumothorax or pleural effusion. The posterior costophrenic angles are not included within the field of view. Heart/mediastinum: Similar enlarged cardiomediastinal silhouette. Bones: No acute osseous abnormality. IMPRESSION: Left chest wall ICD leads project over the right atrium and ventricle. No pneumothorax. Electronically Signed   By: Darrin Nipper M.D.   On: 05/16/2022 08:30   EP PPM/ICD IMPLANT  Result Date: 05/15/2022  CONCLUSIONS:  1. Successful RA lead addition  2. Successful generator replacement  3. ICD pocket revision   DG CHEST PORT 1 VIEW  Result Date: 05/14/2022 CLINICAL DATA:  Short of breath EXAM: PORTABLE CHEST 1 VIEW COMPARISON:  None Available. FINDINGS: LEFT-sided defibrillator overlies stable enlarged  cardiac silhouette. No effusion, infiltrate pneumothorax. IMPRESSION: Cardiomegaly.  No acute cardiopulmonary process. Electronically Signed   By: Suzy Bouchard M.D.   On: 05/14/2022 14:01   CARDIAC CATHETERIZATION  Result Date: 05/13/2022 HEMODYNAMICS: RA:   1 mmHg (mean) RV:   22/1-3 mmHg PA:   23/6 mmHg (12 mean) PCWP:  4 mmHg (mean)    Estimated Fick CO/CI   7 L/min, 2.9 L/min/m2    TPG    8  mmHg     PVR     ~1 Wood Units PAPi      >5  IMPRESSION: Low pre and post capillary filling pressures. Normal cardiac output/cardiac index. Normal PVR & PA mean No obstructive CAD, left dominant w/ large Lcx supplying multiple large marginals. Aditya [name] Advanced Heart Failure 5:46 PM  [place] RT LOWER EXTREM LTD SOFT TISSUE NON VASCULAR  Result Date: 05/08/2022 CLINICAL DATA:  Right gluteal cellulitis EXAM: ULTRASOUND RIGHT LOWER EXTREMITY LIMITED TECHNIQUE: Ultrasound examination of the lower extremity soft tissues was performed in the area of clinical concern. COMPARISON:  None Available. FINDINGS: In the region of concern along the subcutaneous tissues in the right buttock region, there is infiltrative subcutaneous edema. No abscess or drainable process observed. IMPRESSION: 1. In the region of concern along the right buttock region, there is infiltrative subcutaneous edema. No abscess or drainable process observed. Electronically Signed   By: Van Clines M.D.   On: 05/08/2022 09:08   DG Knee 1-2 Views Right  Result Date: 05/07/2022 CLINICAL DATA:  Right knee pain EXAM: RIGHT KNEE - 1-2 VIEW COMPARISON:  None Available. FINDINGS: Normal alignment. No acute fracture or dislocation. Corticated 8 mm ossific density seen within the posterior joint space on lateral examination may represent an intra-articular loose body. No effusion. Soft tissues are unremarkable. IMPRESSION: 1. No acute fracture or dislocation. 2. Possible 8  mm intra-articular loose body. This could be confirmed with MRI examination  if indicated. Electronically Signed   By: Fidela Salisbury M.D.   On: 05/07/2022 14:24   ECHOCARDIOGRAM COMPLETE  Result Date: 05/06/2022    ECHOCARDIOGRAM REPORT   Patient Name:   JAVORIS STAR Date of Exam: 05/06/2022 Medical Rec #:  [id number]      Height:       72.0 in Accession #:    [id number]     Weight:       262.1 lb Date of Birth:  1965/06/10      BSA:          2.390 m Patient Age:    89 years       BP:           102/75 mmHg Patient Gender: M              HR:           89 bpm. Exam Location:  Inpatient Procedure: 2D Echo, Color Doppler and Cardiac Doppler Indications:    ventricular tachycardia  History:        Patient has prior history of Echocardiogram examinations, most                 recent 02/01/2021. Cardiomyopathy, Defibrillator, chronic kidney                 disease; Risk Factors:Former Smoker and Diabetes.  Sonographer:    Johny Chess RDCS Referring Phys: [id number] Shirley Friar  Sonographer Comments: Patient is obese. Image acquisition challenging due to patient body habitus and Image acquisition challenging due to respiratory motion. IMPRESSIONS  1. Left ventricular ejection fraction, by estimation, is 30 to 35%. The left ventricle has moderately decreased function. The left ventricle demonstrates global hypokinesis. The left ventricular internal cavity size was moderately dilated. There is mild  eccentric left ventricular hypertrophy. Left ventricular diastolic parameters are consistent with Grade I diastolic dysfunction (impaired relaxation).  2. Right ventricular systolic function is normal. The right ventricular size is normal. Tricuspid regurgitation signal is inadequate for assessing PA pressure.  3. Left atrial size was moderately dilated.  4. The mitral valve is normal in structure. No evidence of mitral valve regurgitation.  5. The aortic valve is tricuspid. Aortic valve regurgitation is not visualized. No aortic stenosis is present.  6. Aortic dilatation noted. There  is borderline dilatation of the aortic root, measuring 38 mm.  7. The inferior vena cava is normal in size with greater than 50% respiratory variability, suggesting right atrial pressure of 3 mmHg. Comparison(s): No significant change from prior study. Prior images reviewed side by side. FINDINGS  Left Ventricle: Left ventricular ejection fraction, by estimation, is 30 to 35%. The left ventricle has moderately decreased function. The left ventricle demonstrates global hypokinesis. The left ventricular internal cavity size was moderately dilated. There is mild eccentric left ventricular hypertrophy. Left ventricular diastolic parameters are consistent with Grade I diastolic dysfunction (impaired relaxation). Normal left ventricular filling pressure. Right Ventricle: The right ventricular size is normal. No increase in right ventricular wall thickness. Right ventricular systolic function is normal. Tricuspid regurgitation signal is inadequate for assessing PA pressure. Left Atrium: Left atrial size was moderately dilated. Right Atrium: Right atrial size was normal in size. Pericardium: There is no evidence of pericardial effusion. Mitral Valve: The mitral valve is normal in structure. No evidence of mitral valve regurgitation. Tricuspid Valve: The tricuspid valve is normal in structure. Tricuspid valve regurgitation  is not demonstrated. Aortic Valve: The aortic valve is tricuspid. Aortic valve regurgitation is not visualized. No aortic stenosis is present. Pulmonic Valve: The pulmonic valve was grossly normal. Pulmonic valve regurgitation is trivial. Aorta: Aortic dilatation noted. There is borderline dilatation of the aortic root, measuring 38 mm. Venous: The inferior vena cava is normal in size with greater than 50% respiratory variability, suggesting right atrial pressure of 3 mmHg. IAS/Shunts: No atrial level shunt detected by color flow Doppler. Additional Comments: A device lead is visualized in the right  ventricle.  LEFT VENTRICLE PLAX 2D LVIDd:         6.40 cm      Diastology LVIDs:         3.90 cm      LV e' medial:    12.50 cm/s LV PW:         1.20 cm      LV E/e' medial:  4.6 LV IVS:        1.20 cm      LV e' lateral:   7.40 cm/s LVOT diam:     2.50 cm      LV E/e' lateral: 7.8 LV SV:         64 LV SV Index:   27 LVOT Area:     4.91 cm  LV Volumes (MOD) LV vol d, MOD A4C: 168.0 ml LV vol s, MOD A4C: 99.8 ml LV SV MOD A4C:     168.0 ml RIGHT VENTRICLE             IVC RV Basal diam:  2.80 cm     IVC diam: 2.10 cm RV S prime:     20.80 cm/s TAPSE (M-mode): 1.8 cm LEFT ATRIUM             Index        RIGHT ATRIUM           Index LA diam:        4.60 cm 1.92 cm/m   RA Area:     18.90 cm LA Vol (A2C):   64.8 ml 27.11 ml/m  RA Volume:   46.90 ml  19.62 ml/m LA Vol (A4C):   78.7 ml 32.93 ml/m LA Biplane Vol: 75.9 ml 31.76 ml/m  AORTIC VALVE LVOT Vmax:   80.70 cm/s LVOT Vmean:  54.000 cm/s LVOT VTI:    0.130 m  AORTA Ao Root diam: 3.80 cm Ao Asc diam:  3.30 cm MITRAL VALVE MV Area (PHT): 5.02 cm    SHUNTS MV Decel Time: 151 msec    Systemic VTI:  0.13 m MV E velocity: 57.60 cm/s  Systemic Diam: 2.50 cm MV A velocity: 80.70 cm/s MV E/A ratio:  0.71 Mihai Croitoru MD Electronically signed by Sanda Klein MD Signature Date/Time: 05/06/2022/4:28:46 PM    Final    CT PELVIS WO CONTRAST  Result Date: 05/05/2022 CLINICAL DATA:  Soft tissue infection suspected, pelvis, xray done right gluteal edema, cellulitis, eval for infection EXAM: CT PELVIS WITHOUT CONTRAST TECHNIQUE: Multidetector CT imaging of the pelvis was performed following the standard protocol without intravenous contrast. RADIATION DOSE REDUCTION: This exam was performed according to the departmental dose-optimization program which includes automated exposure control, adjustment of the mA and/or kV according to patient size and/or use of iterative reconstruction technique. COMPARISON:  None Available. FINDINGS: Urinary Tract:  No abnormality  visualized. Bowel: Scattered colonic diverticulosis. Otherwise unremarkable visualized pelvic bowel loops. Vascular/Lymphatic: Atherosclerotic plaque. No pathologically enlarged lymph nodes. No significant vascular  abnormality seen. Reproductive: Prostate measures 4.8 cm. No mass or other significant abnormality Other: No intraperitoneal free fluid. No intraperitoneal free gas. No organized fluid collection. Musculoskeletal: Diffuse subcutaneus soft tissue edema of the right gluteal soft tissues with no emphysema or organized fluid collection. Associated slight haziness of the right gluteal maximus. Slight extension of the fat stranding to the perineal region and possible right scrotum. Some mild stranding of the left gluteal soft tissues may be normal. No cortical erosion or destruction. No suspicious bone lesions identified. No acute displaced fracture or dislocation. At least mild degenerative changes of the bilateral hips. IMPRESSION: 1. Diffuse subcutaneus soft tissue edema of the right gluteal soft tissues with no emphysema or organized fluid collection. Associated slight haziness of the right gluteal maximus suggestive of possible developing associated myositis. Slight extension of the fat stranding to the perineal region and possible right scrotum. Please note a necrotizing fasciitis cannot be excluded as this is a clinical diagnosis. 2. Other imaging findings of potential clinical significance: Colonic diverticulosis with no acute diverticulitis. Electronically Signed   By: Iven Finn M.D.   On: 05/05/2022 20:37   DG Chest Portable 1 View  Result Date: 05/03/2022 CLINICAL DATA:  dyspnea EXAM: PORTABLE CHEST 1 VIEW COMPARISON:  Chest x-ray 03/21/2022, CT chest 05/08/2004 FINDINGS: Left chest wall single lead cardiac defibrillator in grossly appropriate position. Stable enlarged cardiac silhouette. The heart and mediastinal contours are enlarged. Low lung volumes. No focal consolidation. No  pulmonary edema. No pleural effusion. No pneumothorax. No acute osseous abnormality. IMPRESSION: Cardiomegaly with no active disease. Electronically Signed   By: Iven Finn M.D.   On: 05/03/2022 20:53    Microbiology: Results for orders placed or performed during the hospital encounter of 05/03/22  MRSA Next Gen by PCR, Nasal     Status: None   Collection Time: 05/03/22 10:48 PM   Specimen: Nasal Mucosa; Nasal Swab  Result Value Ref Range Status   MRSA by PCR Next Gen NOT DETECTED NOT DETECTED Final    Comment: (NOTE) The GeneXpert MRSA Assay (FDA approved for NASAL specimens only), is one component of a comprehensive MRSA colonization surveillance program. It is not intended to diagnose MRSA infection nor to guide or monitor treatment for MRSA infections. Test performance is not FDA approved in patients less than 48 years old. Performed at [facility], [place] [address], [place], [place] [address]   C Difficile Quick Screen w PCR reflex     Status: None   Collection Time: 05/05/22  8:35 PM   Specimen: Stool  Result Value Ref Range Status   C Diff antigen NEGATIVE NEGATIVE Final   C Diff toxin NEGATIVE NEGATIVE Final   C Diff interpretation No C. difficile detected.  Final    Comment: Performed at [facility], [place] [address], [place], [place] [address]  Gastrointestinal Panel by PCR , Stool     Status: None   Collection Time: 05/06/22  9:53 PM   Specimen: Stool  Result Value Ref Range Status   Campylobacter species NOT DETECTED NOT DETECTED Final   Plesimonas shigelloides NOT DETECTED NOT DETECTED Final   Salmonella species NOT DETECTED NOT DETECTED Final   Yersinia enterocolitica NOT DETECTED NOT DETECTED Final   Vibrio species NOT DETECTED NOT DETECTED Final   Vibrio cholerae NOT DETECTED NOT DETECTED Final   Enteroaggregative E coli (EAEC) NOT DETECTED NOT DETECTED Final   Enteropathogenic E coli (EPEC) NOT DETECTED NOT DETECTED Final   Enterotoxigenic  E coli  (ETEC) NOT DETECTED NOT DETECTED Final   Shiga like toxin producing E coli (STEC) NOT DETECTED NOT DETECTED Final   Shigella/Enteroinvasive E coli (EIEC) NOT DETECTED NOT DETECTED Final   Cryptosporidium NOT DETECTED NOT DETECTED Final   Cyclospora cayetanensis NOT DETECTED NOT DETECTED Final   Entamoeba histolytica NOT DETECTED NOT DETECTED Final   Giardia lamblia NOT DETECTED NOT DETECTED Final   Adenovirus F40/41 NOT DETECTED NOT DETECTED Final   Astrovirus NOT DETECTED NOT DETECTED Final   Norovirus GI/GII NOT DETECTED NOT DETECTED Final   Rotavirus A NOT DETECTED NOT DETECTED Final   Sapovirus (I, II, IV, and V) NOT DETECTED NOT DETECTED Final    Comment: Performed at [facility], [address] [place], [place] [address]   *Note: Due to a large number of results and/or encounters for the requested time period, some results have not been displayed. A complete set of results can be found in Results Review.    Labs: CBC: Recent Labs  Lab 05/13/22 0229 05/14/22 0130 05/15/22 0213 05/16/22 0150 05/17/22 0109  WBC 11.7* 12.3* 11.1* 10.6* 10.3  HGB 12.1* 11.2* 11.6* 12.5* 11.6*  HCT 35.2* 34.9* 34.2* 37.3* 34.4*  MCV 89.3 91.8 89.8 91.2 90.8  PLT 540* 516* 551* 549* 677*   Basic Metabolic Panel: Recent Labs  Lab 05/13/22 0229 05/14/22 0130 05/15/22 0213 05/16/22 0150 05/17/22 0109  NA 134* 137 137 136 137  K 5.0 4.6 4.7 4.8 4.6  CL 109 109 110 106 109  CO2 18* 19* 20* 20* 21*  GLUCOSE 269* 212* 272* 216* 133*  BUN 36* 34* 30* 31* 30*  CREATININE 1.79* 1.81* 1.78* 1.85* 1.85*  CALCIUM 8.1* 8.7* 8.2* 8.7* 8.7*  MG 2.2 2.1 1.9 2.2 2.1   Liver Function Tests: Recent Labs  Lab 05/12/22 1040  AST 18  ALT 26  ALKPHOS 58  BILITOT 0.4  PROT 6.8  ALBUMIN 2.5*   CBG: Recent Labs  Lab 05/16/22 1238 05/16/22 1737 05/16/22 2105 05/17/22 0622 05/17/22 1206  GLUCAP 224* 169* 104* 112* 200*    Discharge time spent: greater than 30  minutes.  Signed: Murray Hodgkins, MD [facility] 05/17/2022

## 2022-05-19 ENCOUNTER — Encounter (HOSPITAL_COMMUNITY): Payer: Medicaid Other | Admitting: Cardiology

## 2022-05-19 ENCOUNTER — Telehealth: Payer: Self-pay

## 2022-05-19 NOTE — Telephone Encounter (Signed)
Transition Care Management Follow-up Telephone Call Date of discharge and from where: 05/17/2022, [facility] How have you been since you were released from the hospital? He said he is okay, trying to pull himself together. His chest is still sore from all that he has been through. Any questions or concerns? Yes - he was concerned about the increased dose of trulicity that he ordered prior to his hospitalization and stated that is what lead to his cardiac problems   Items Reviewed: Did the pt receive and understand the discharge instructions provided? Yes  Medications obtained and verified? Yes - he said he has all medications and did not have any questions about the med regime, he also stated that he has a glucometer and nebulizer  Other? No  Any new allergies since your discharge? No  Dietary orders reviewed? Yes Do you have support at home? Yes   Home Care and Equipment/Supplies: Were home health services ordered? no If so, what is the name of the agency? N/a  Has the agency set up a time to come to the patient's home? not applicable Were any new equipment or medical supplies ordered?  Yes: rolltor What is the name of the medical supply agency? [employer] [facility]  Were you able to get the supplies/equipment? yes Do you have any questions related to the use of the equipment or supplies? No  Functional Questionnaire: (I = Independent and D = Dependent) ADLs: ambulates with RW.  Follow up appointments reviewed:  PCP Hospital f/u appt confirmed?  He said he does not want to schedule an appointment at this time. He will need to decide if he wants to return to [facility] f/u appt confirmed? Yes  Scheduled to see cardiology- 05/26/2022 . Are transportation arrangements needed?  He said he is not sure. .I explained to him that his insurance company will usually provide rides to medical appointments If their condition worsens, is the pt aware to call PCP or go to the  Emergency Dept.? Yes Was the patient provided with contact information for the PCP's office or ED? Yes. He is not sure if he will return to [facility] Was to pt encouraged to call back with questions or concerns? Yes

## 2022-05-19 NOTE — Progress Notes (Signed)
No ICM remote transmission received for 05/19/2022 due to Corvue thoracic impedance is still developing following 05/15/2022 lead revision. Next ICM transmission scheduled for 07/07/2022.

## 2022-05-21 ENCOUNTER — Telehealth (HOSPITAL_COMMUNITY): Payer: Self-pay | Admitting: Licensed Clinical Social Worker

## 2022-05-21 NOTE — Telephone Encounter (Signed)
H&V Care Navigation CSW Progress Note  Clinical Social Worker able to submit Heart and Vascular Patient Care fund check request for $1,000 to help with outstanding electric bill charges- pt confirms he can pay the remainder of the bill.  CSW reminded pt to go to [facility] to get application for lower rate for electric bills.  Patient is participating in a Managed Medicaid Plan:  Yes  [name]: No Food Insecurity (05/14/2022)  Housing: Low Risk  (05/14/2022)  Transportation Needs: No Transportation Needs (05/06/2022)  Utilities: At Risk (05/08/2022)  Alcohol Screen: Low Risk  (10/13/2018)  Depression (PHQ2-9): Medium Risk (03/04/2022)  Financial Resource Strain: High Risk (05/08/2022)  Social Connections: Moderately Integrated (03/12/2021)  Tobacco Use: Medium Risk (05/16/2022)    [name], LCSW Clinical Social Worker [facility] Desk#: [phone] Cell#: [phone]

## 2022-05-22 NOTE — Progress Notes (Signed)
Cardiology Office Note Date:  05/22/2022  Patient ID:  [name] 1965-07-16, MRN [id number] PCP:  [name], MD  Cardiologist:  [name] Electrophysiologist: Dr. Rayann Heman >> Dr. Quentin Ore    Chief Complaint:  post hospital/wound check  History of Present Illness: [name] is a 56 y.o. male with history of NICM/chronic systolic CHF s/p ICD, normal coronary arteries by cath 2015, GERD, HTN and DM2, VT/VF.  Admitted 05/03/22 with VT storm, 22 ICD shocks and 2 ATP, 10 aborted shocks on initial arrival. Felt to be likely in setting of hypokalemia, diarrhea, and infection. However despite had recurrent VT despite management and correction of both and back on amiodarone gtt  >>  PO amiodarone > developed bradycardia and intermittent RV pacing that triggered NSPMVT episodes, ultimately felt to be pause dependent and not pacing triggered. R/LHC looked good, volume stable, no obstructive CAD, LVEF stable 30-35% Underwent device upgrade to a dual chamber device 05/15/22 by Dr. Quentin Ore EP signed off 05/16/22 cleared to start BB of HF team's choice.  Note that initially with diarrhea, gluteal wound?  suspected due to increase in Trulicity dose out patient:  Trulicity discontinued> C. Difficile and GI penl negative.   And got significantly better.     Gen surgery signed off 05/09/22 note: "No signs of abscess or indication for surgery at this time given exam more consistent with cellulitis and recent CT and u/s negative for underlying abscess. Continue antibiotics. If exam becomes more concerning for abscess development could repeat u/s and call us back. We will sign off, please call with questions or concerns." "No real signs of abscess that is drainable. May coalesce to abscess with time.  Would rec [place] to eval and can then drain at that time." Empiric course of  zosyn finished 12/6 11/30- shows region of concern along the right buttock infiltrative subcutaneous edema no  abscess or drainable process observed.  CT scan 11/27 no evidence of abscess or drainable fluid collection.   Surgery signed off, no need for procedure.   Empiric Zosyn will be continued x10 days until 12/6   By our evaluation pre-device upgrade I do not note any open wounds.  Discharged 05/17/22 Toprol 76m daily Amiodarone 4061mBID to 12/9 > 40064maily until f/u and reassessment no SGLT2 w/ perineal infection  PRN lasix  TODAY He has had a couple episodes that he felt like he was either having panic/anxiety or tachycardia. He is scared about getting shocked again. He has not had CP, no near syncope or syncope.  With these episodes he feels breathless, and has the sense of palpitations that are reminiscent of how he felt before getting shocked.   Device information Abbott single chamber ICD implanted 12/23/2013 >> upgraded to dual chamber devce ( A lead added) 05/15/22  + appropriate tx, VT storm  AAD hx Amiodarone started Nov 2023  Past Medical History:  Diagnosis Date   AICD (automatic cardioverter/defibrillator) present    Chronic bronchitis ([facility]  "get it most q yr" (12/23/2013)   Chronic systolic (congestive) heart failure ([facility]  Fracture of left humerus    a. 07/2013.   GERD (gastroesophageal reflux disease)    not at present time   Heart murmur    "born w/it"    History of renal calculi    Hypertension    Kidney stones    NICM (nonischemic cardiomyopathy) ([place]  a. 07/2013 Echo: EF 20-25%, diff  HK, Gr2 DD, mild MR, mod dil LA/RA. 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    Shockable heart rhythm detected by automated external defibrillator    Syncope    Type II diabetes mellitus ([place]) 2006    Past Surgical History:  Procedure Laterality Date   CARDIAC CATHETERIZATION  08/10/13   CHOLECYSTECTOMY  ~ 2010   IMPLANTABLE CARDIOVERTER DEFIBRILLATOR IMPLANT  12/23/2013   STJ Fortify  ICD implanted by [name] for cardiomyopathy and syncope   IMPLANTABLE CARDIOVERTER DEFIBRILLATOR IMPLANT N/A 12/23/2013   Procedure: IMPLANTABLE CARDIOVERTER DEFIBRILLATOR IMPLANT;  Surgeon: Coralyn Mark, MD;  Location: [facility] CATH LAB;  Service: Cardiovascular;  Laterality: N/A;   INGUINAL HERNIA REPAIR Left 03/01/2018   Procedure: OPEN REPAIR OF LEFT INGUINAL HERNIA WITH MESH;  Surgeon: Greer Pickerel, MD;  Location: [facility] ORS;  Service: General;  Laterality: Left;   LEAD REVISION/REPAIR N/A 05/15/2022   Procedure: LEAD REVISION/REPAIR;  Surgeon: Vickie Epley, MD;  Location: [place] CV LAB;  Service: Cardiovascular;  Laterality: N/A;   LEFT HEART CATHETERIZATION WITH CORONARY ANGIOGRAM N/A 08/10/2013   Procedure: LEFT HEART CATHETERIZATION WITH CORONARY ANGIOGRAM;  Surgeon: Jettie Booze, MD;  Location: [facility] CATH LAB;  Service: Cardiovascular;  Laterality: N/A;   ORIF HUMERUS FRACTURE Left 08/12/2013   Procedure: OPEN REDUCTION INTERNAL FIXATION (ORIF) HUMERAL SHAFT FRACTURE;  Surgeon: Newt Minion, MD;  Location: [place];  Service: Orthopedics;  Laterality: Left;  Open Reduction Internal Fixation Left Humerus   RIGHT/LEFT HEART CATH AND CORONARY ANGIOGRAPHY N/A 05/12/2022   Procedure: RIGHT/LEFT HEART CATH AND CORONARY ANGIOGRAPHY;  Surgeon: Hebert Soho, DO;  Location: [place] CV LAB;  Service: Cardiovascular;  Laterality: N/A;   URETEROSCOPY     "laser for kidney stones"    Current Outpatient Medications  Medication Sig Dispense Refill   Accu-Chek Softclix Lancets lancets Use as instructed 100 each 12   albuterol (VENTOLIN HFA) 108 (90 Base) MCG/ACT inhaler Inhale into the lungs every 6 (six) hours as needed for wheezing or shortness of breath.     amiodarone (PACERONE) 400 MG tablet Take 1 tablet (400 mg total) by mouth daily. 30 tablet 2   aspirin 81 MG tablet Take 81 mg by mouth daily.     atorvastatin (LIPITOR) 10 MG tablet Take 1 tablet (10 mg total) by mouth daily. 90  tablet 1   Blood Glucose Monitoring Suppl (ACCU-CHEK GUIDE) w/Device KIT UAD 1 kit 0   cetirizine (ZYRTEC) 10 MG tablet Take 1 tablet (10 mg total) by mouth daily. 90 tablet 1   chlorpheniramine-HYDROcodone (TUSSIONEX) 10-8 MG/5ML Take 5 mLs by mouth every 12 (twelve) hours as needed for cough. 115 mL 0   clobetasol ointment (TEMOVATE) 5.18 % Apply 1 Application topically 2 (two) times daily. 30 g 3   Continuous Blood Gluc Receiver (FREESTYLE LIBRE 2 READER) DEVI Use to check blood sugar TID.  E11.65 1 each 0   Continuous Blood Gluc Sensor (FREESTYLE LIBRE 2 SENSOR) MISC Use to check blood sugar TID. Change sensor once every 14 days. E11.65 2 each 3   dapagliflozin propanediol (FARXIGA) 5 MG TABS tablet Take 1 tablet (5 mg total) by mouth daily before breakfast. 90 tablet 1   EPINEPHrine 0.3 mg/0.3 mL IJ SOAJ injection Inject 0.3 mg into the muscle as needed. 1 each 1   famotidine (PEPCID) 20 MG tablet Take 1 tablet by mouth 1-2 times a day  as needed. (Patient taking differently: Take 20 mg by mouth See admin instructions. Take 1 tablet by mouth 1-2 times a day as needed for heartburn or indigestion) 90 tablet 1   fluticasone (FLONASE) 50 MCG/ACT nasal spray Use 1-2 sprays in each nostril once a day as needed for nasal congestion. 16 g 5   furosemide (LASIX) 80 MG tablet Take 1 tablet (80 mg total) by mouth daily as needed for edema or fluid. 180 tablet 1   gabapentin (NEURONTIN) 300 MG capsule TAKE 1 CAPSULE BY MOUTH EVERY DAY AT BEDTIME (Patient taking differently: Take 300 mg by mouth at bedtime.) 90 capsule 1   glucose blood (ACCU-CHEK GUIDE) test strip Use as instructed 100 each 12   insulin aspart (NOVOLOG FLEXPEN) 100 UNIT/ML FlexPen Inject 10 Units into the skin 2 (two) times daily before a meal. 15 mL 2   insulin glargine (LANTUS SOLOSTAR) 100 UNIT/ML Solostar Pen Inject 30 Units into the skin daily. 15 mL 1   Insulin Pen Needle (PEN NEEDLES) 31G X 8 MM MISC UAD 100 each 6    ipratropium-albuterol (DUONEB) 0.5-2.5 (3) MG/3ML SOLN Take 3 mLs by nebulization every 6 (six) hours as needed. (Patient taking differently: Take 3 mLs by nebulization every 6 (six) hours as needed (for wheezing or shortness of breath).) 360 mL 2   Lancet Devices (ACCU-CHEK SOFTCLIX) lancets 1 each by Other route 3 (three) times daily. 1 each 0   methocarbamol (ROBAXIN) 500 MG tablet TAKE 1 TABLET BY MOUTH ONCE DAILY AS NEEDED FOR MUSCLE SPASM (Patient taking differently: Take 500 mg by mouth daily as needed for muscle spasms.) 30 tablet 0   metoprolol succinate (TOPROL-XL) 25 MG 24 hr tablet Take 1 tablet (25 mg total) by mouth daily. 30 tablet 2   mometasone (ELOCON) 0.1 % cream Apply 1 application topically daily. 45 g 1   potassium chloride SA (KLOR-CON M) 20 MEQ tablet Take 1 tablet (20 mEq total) by mouth 2 (two) times daily. 60 tablet 5   predniSONE (DELTASONE) 10 MG tablet Take 1 tablet (10 mg total) by mouth daily with breakfast. 2 tablet 0   promethazine-dextromethorphan (PROMETHAZINE-DM) 6.25-15 MG/5ML syrup Take 5 mLs by mouth 4 (four) times daily as needed for cough. 180 mL 0   sacubitril-valsartan (ENTRESTO) 24-26 MG Take 1 tablet by mouth 2 (two) times daily. 180 tablet 1   sildenafil (VIAGRA) 100 MG tablet Take 1-2 tabs PO 1/2-1 hr prior to intercourse. 20 tablet 4   spironolactone (ALDACTONE) 25 MG tablet Take 1 tablet (25 mg total) by mouth daily. 30 tablet 5   tamsulosin (FLOMAX) 0.4 MG CAPS capsule Take 2 capsules (0.8 mg total) by mouth daily. 180 capsule 1   triamcinolone cream (KENALOG) 0.1 % Apply sparingly to red itchy areas twice daily as needed. Avoid face, neck, armpits or groin area. Do not use more than 3 weeks in a row. 45 g 5   No current facility-administered medications for this visit.    Allergies:   Bee venom and Trulicity [dulaglutide]   Social History:  The patient  reports that he quit smoking about 7 years ago. His smoking use included cigarettes. He has a  28.00 pack-year smoking history. He has never used smokeless tobacco. He reports current alcohol use. He reports current drug use. Drug: Marijuana.   Family History:  The patient's family history includes Arthritis in his father and mother; Diabetes in his father and mother; Hypertension in his brother, father, and mother.  ROS:  Please see the history of present illness.    All other systems are reviewed and otherwise negative.   PHYSICAL EXAM:  VS:  There were no vitals taken for this visit. BMI: There is no height or weight on file to calculate BMI. Well nourished, well developed, in no acute distress HEENT: normocephalic, atraumatic Neck: no JVD, carotid bruits or masses Cardiac:  RRR; no significant murmurs, no rubs, or gallops Lungs:  CTA b/l, no wheezing, rhonchi or rales Abd: soft, nontender MS: no deformity or atrophy Ext: no edema Skin: warm and dry, no rash Neuro:  No gross deficits appreciated Psych: euthymic mood, full affect  ICD site: steri strips are removed without difficulty.  Wound edges are well approximated, healed, no erythema, edema or heat, no fluctuation. Appears well healed with no signs of infetion   EKG:  not done today  Device interrogation done today and reviewed by myself:  Battery and lead testing done are good No VT or arrhythmias AP 47% VP<1%   05/12/22: R/LHC HEMODYNAMICS: RA:                  1 mmHg (mean) RV:                  22/1-3 mmHg PA:                  23/6 mmHg (12 mean) PCWP:            4 mmHg (mean)                                      Estimated Fick CO/CI   7 L/min, 2.9 L/min/m2                                                 TPG                 8  mmHg                                              PVR                 ~1 Wood Units  PAPi                >5       IMPRESSION: Low pre and post capillary filling pressures.  Normal cardiac output/cardiac index.  Normal PVR & PA mean No obstructive CAD, left dominant w/ large  Lcx supplying multiple large marginals.    05/06/22: TTE 1. Left ventricular ejection fraction, by estimation, is 30 to 35%. The  left ventricle has moderately decreased function. The left ventricle  demonstrates global hypokinesis. The left ventricular internal cavity size  was moderately dilated. There is mild   eccentric left ventricular hypertrophy. Left ventricular diastolic  parameters are consistent with Grade I diastolic dysfunction (impaired  relaxation).   2. Right ventricular systolic function is normal. The right ventricular  size is normal. Tricuspid regurgitation signal is inadequate for assessing  PA pressure.   3. Left atrial size was moderately dilated.   4. The mitral valve is normal  in structure. No evidence of mitral valve  regurgitation.   5. The aortic valve is tricuspid. Aortic valve regurgitation is not  visualized. No aortic stenosis is present.   6. Aortic dilatation noted. There is borderline dilatation of the aortic  root, measuring 38 mm.   7. The inferior vena cava is normal in size with greater than 50%  respiratory variability, suggesting right atrial pressure of 3 mmHg.   Comparison(s): No significant change from prior study. Prior images  reviewed side by side.    Recent Labs: 03/21/2022: Pro B Natriuretic peptide (BNP) 18.0 05/06/2022: B Natriuretic Peptide 487.7; TSH 1.156 05/12/2022: ALT 26 05/17/2022: BUN 30; Creatinine, Ser 1.85; Hemoglobin 11.6; Magnesium 2.1; Platelets 486; Potassium 4.6; Sodium 137  08/26/2021: Chol/HDL Ratio 4.2; Cholesterol, Total 178; HDL 42; LDL Chol Calc (NIH) 81; Triglycerides 336   Estimated Creatinine Clearance: 58.3 mL/min (A) (by C-G formula based on SCr of 1.85 mg/dL (H)).   Wt Readings from Last 3 Encounters:  05/17/22 259 lb (117.5 kg)  04/04/22 260 lb 2.3 oz (118 kg)  03/21/22 259 lb (117.5 kg)     Other studies reviewed: Additional studies/records reviewed today include: summarized above  ASSESSMENT  AND PLAN:  ICD Intact function Wound is well healed, no signs of infection  He was very anxious about me check ing his device, asked that I not do any testing that would affect his HR. A lead is on auto threshold and OK, RV lead is chronic and checked on the 8th and OK  VT/VF Amiodarone No arrhythmias Will get labs today Plan to reduce amiodarone perhaps in another week  NICM Chronic CHF (systolic) No CorVue data yet No exam findings to suggest volume OL Sees HF team next week  He is not driving   5. Anxiety After I removed his steri-strips and we were talking he developed symptoms that he says is what he has had a couple time since home, palpitations, and sense of having VT/about to be shocked. His HR is regular and in the 70's We discussed that his anxiety/fear is normal and management strategies, he would love to try and avoid another mediation, we have put an urgent referral in for Dr. Michail Sermon, discussed tools for him to be able to check in on his pulse,/HR to give him assurance in his HR at least, he has his APP on his phone to check in his device transmissions. He mentions today has helped, looks forward to talking with Dr. Michail Sermon.   Disposition: F/u with HF team next week as scheduled, we can have him back a couple weeks after that to help him through these 1st several weeks s/p VT storm.  Current medicines are reviewed at length with the patient today.  The patient did not have any concerns regarding medicines.  Venetia Night, PA-C 05/22/2022 7:19 AM     [facility] [place] [address] [phone] (office)  [phone] (fax)

## 2022-05-23 ENCOUNTER — Ambulatory Visit: Payer: Medicaid Other | Attending: Physician Assistant | Admitting: Physician Assistant

## 2022-05-23 ENCOUNTER — Encounter: Payer: Self-pay | Admitting: Physician Assistant

## 2022-05-23 VITALS — BP 118/65 | HR 76 | Ht 72.0 in | Wt 254.0 lb

## 2022-05-23 DIAGNOSIS — Z95810 Presence of automatic (implantable) cardiac defibrillator: Secondary | ICD-10-CM | POA: Diagnosis not present

## 2022-05-23 DIAGNOSIS — I428 Other cardiomyopathies: Secondary | ICD-10-CM

## 2022-05-23 DIAGNOSIS — Z79899 Other long term (current) drug therapy: Secondary | ICD-10-CM | POA: Diagnosis not present

## 2022-05-23 DIAGNOSIS — F419 Anxiety disorder, unspecified: Secondary | ICD-10-CM | POA: Diagnosis not present

## 2022-05-23 DIAGNOSIS — I4901 Ventricular fibrillation: Secondary | ICD-10-CM

## 2022-05-23 DIAGNOSIS — I5022 Chronic systolic (congestive) heart failure: Secondary | ICD-10-CM | POA: Diagnosis not present

## 2022-05-23 DIAGNOSIS — I472 Ventricular tachycardia, unspecified: Secondary | ICD-10-CM

## 2022-05-23 LAB — CUP PACEART INCLINIC DEVICE CHECK
Battery Remaining Longevity: 105 mo
Brady Statistic RA Percent Paced: 47 %
Brady Statistic RV Percent Paced: 0.27 %
Date Time Interrogation Session: 20231215125056
HighPow Impedance: 51.75 Ohm
Implantable Lead Connection Status: 753985
Implantable Lead Connection Status: 753985
Implantable Lead Implant Date: 20150717
Implantable Lead Implant Date: 20231207
Implantable Lead Location: 753859
Implantable Lead Location: 753860
Implantable Pulse Generator Implant Date: 20231207
Lead Channel Impedance Value: 350 Ohm
Lead Channel Impedance Value: 375 Ohm
Lead Channel Pacing Threshold Amplitude: 0.625 V
Lead Channel Pacing Threshold Pulse Width: 0.5 ms
Lead Channel Sensing Intrinsic Amplitude: 12 mV
Lead Channel Sensing Intrinsic Amplitude: 2.2 mV
Lead Channel Setting Pacing Amplitude: 1.625
Lead Channel Setting Pacing Amplitude: 2 V
Lead Channel Setting Pacing Pulse Width: 0.5 ms
Lead Channel Setting Sensing Sensitivity: 0.5 mV
Pulse Gen Serial Number: 810051574
Zone Setting Status: 755011

## 2022-05-23 LAB — COMPREHENSIVE METABOLIC PANEL
ALT: 14 IU/L (ref 0–44)
AST: 9 IU/L (ref 0–40)
Albumin/Globulin Ratio: 1.2 (ref 1.2–2.2)
Albumin: 3.8 g/dL (ref 3.8–4.9)
Alkaline Phosphatase: 88 IU/L (ref 44–121)
BUN/Creatinine Ratio: 9 (ref 9–20)
BUN: 19 mg/dL (ref 6–24)
Bilirubin Total: 0.2 mg/dL (ref 0.0–1.2)
CO2: 20 mmol/L (ref 20–29)
Calcium: 9.6 mg/dL (ref 8.7–10.2)
Chloride: 106 mmol/L (ref 96–106)
Creatinine, Ser: 2.02 mg/dL — ABNORMAL HIGH (ref 0.76–1.27)
Globulin, Total: 3.2 g/dL (ref 1.5–4.5)
Glucose: 218 mg/dL — ABNORMAL HIGH (ref 70–99)
Potassium: 5.4 mmol/L — ABNORMAL HIGH (ref 3.5–5.2)
Sodium: 140 mmol/L (ref 134–144)
Total Protein: 7 g/dL (ref 6.0–8.5)
eGFR: 38 mL/min/{1.73_m2} — ABNORMAL LOW (ref >59)

## 2022-05-23 LAB — TSH: TSH: 1.15 u[IU]/mL (ref 0.450–4.500)

## 2022-05-23 NOTE — Patient Instructions (Addendum)
Medication Instructions:   Your physician recommends that you continue on your current medications as directed. Please refer to the Current Medication list given to you today.  *If you need a refill on your cardiac medications before your next appointment, please call your pharmacy*   Lab Work:  TODAY :  CMET AND TSH TODAY    If you have labs (blood work) drawn today and your tests are completely normal, you will receive your results only by: [place] (if you have MyChart) OR A paper copy in the mail If you have any lab test that is abnormal or we need to change your treatment, we will call you to review the results.   Testing/Procedures: NONE ORDERED  TODAY     Follow-Up: At [facility], you and your health needs are our priority.  As part of our continuing mission to provide you with exceptional heart care, we have created designated Provider Care Teams.  These Care Teams include your primary Cardiologist (physician) and Advanced Practice Providers (APPs -  Physician Assistants and Nurse Practitioners) who all work together to provide you with the care you need, when you need it.  We recommend signing up for the patient portal called "MyChart".  Sign up information is provided on this After Visit Summary.  MyChart is used to connect with patients for Virtual Visits (Telemedicine).  Patients are able to view lab/test results, encounter notes, upcoming appointments, etc.  Non-urgent messages can be sent to your provider as well.   To learn more about what you can do with MyChart, go to [email or web address].    Your next appointment:   You have been referred to: [facility] : DR Conception Chancy   2- 3 week(s)  The format for your next appointment:   In Person  Provider:   Tommye Standard, PA-C      Important Information About Sugar

## 2022-05-26 ENCOUNTER — Encounter (HOSPITAL_COMMUNITY): Payer: Self-pay | Admitting: Cardiology

## 2022-05-26 ENCOUNTER — Ambulatory Visit (HOSPITAL_COMMUNITY)
Admit: 2022-05-26 | Discharge: 2022-05-26 | Disposition: A | Discharge status: Home / Self Care | Payer: Medicaid Other | Attending: Cardiology | Admitting: Cardiology

## 2022-05-26 VITALS — BP 98/58 | HR 74 | Wt 254.8 lb

## 2022-05-26 DIAGNOSIS — L03317 Cellulitis of buttock: Secondary | ICD-10-CM | POA: Insufficient documentation

## 2022-05-26 DIAGNOSIS — I130 Hypertensive heart and chronic kidney disease with heart failure and stage 1 through stage 4 chronic kidney disease, or unspecified chronic kidney disease: Secondary | ICD-10-CM | POA: Diagnosis not present

## 2022-05-26 DIAGNOSIS — N1832 Chronic kidney disease, stage 3b: Secondary | ICD-10-CM | POA: Insufficient documentation

## 2022-05-26 DIAGNOSIS — Z79899 Other long term (current) drug therapy: Secondary | ICD-10-CM | POA: Diagnosis not present

## 2022-05-26 DIAGNOSIS — F410 Panic disorder [episodic paroxysmal anxiety] without agoraphobia: Secondary | ICD-10-CM | POA: Insufficient documentation

## 2022-05-26 DIAGNOSIS — F419 Anxiety disorder, unspecified: Secondary | ICD-10-CM

## 2022-05-26 DIAGNOSIS — Z794 Long term (current) use of insulin: Secondary | ICD-10-CM | POA: Diagnosis not present

## 2022-05-26 DIAGNOSIS — E1122 Type 2 diabetes mellitus with diabetic chronic kidney disease: Secondary | ICD-10-CM | POA: Diagnosis not present

## 2022-05-26 DIAGNOSIS — I472 Ventricular tachycardia, unspecified: Secondary | ICD-10-CM | POA: Diagnosis not present

## 2022-05-26 DIAGNOSIS — I5022 Chronic systolic (congestive) heart failure: Secondary | ICD-10-CM | POA: Diagnosis not present

## 2022-05-26 LAB — BASIC METABOLIC PANEL
Anion gap: 10 (ref 5–15)
BUN: 18 mg/dL (ref 6–20)
CO2: 19 mmol/L — ABNORMAL LOW (ref 22–32)
Calcium: 9.1 mg/dL (ref 8.9–10.3)
Chloride: 107 mmol/L (ref 98–111)
Creatinine, Ser: 2.12 mg/dL — ABNORMAL HIGH (ref 0.61–1.24)
GFR, Estimated: 36 mL/min — ABNORMAL LOW (ref >60)
Glucose, Bld: 256 mg/dL — ABNORMAL HIGH (ref 70–99)
Potassium: 5 mmol/L (ref 3.5–5.1)
Sodium: 136 mmol/L (ref 135–145)

## 2022-05-26 LAB — BRAIN NATRIURETIC PEPTIDE: B Natriuretic Peptide: 18.9 pg/mL (ref 0.0–100.0)

## 2022-05-26 MED ORDER — FLUOXETINE HCL 10 MG PO CAPS: 10.0000 mg | ORAL_CAPSULE | Freq: Every day | ORAL | 3 refills | Status: DC

## 2022-05-26 MED ORDER — DAPAGLIFLOZIN PROPANEDIOL 10 MG PO TABS: 10.0000 mg | ORAL_TABLET | Freq: Every day | ORAL | 3 refills | Status: DC

## 2022-05-26 MED ORDER — SPIRONOLACTONE 25 MG PO TABS: 12.5000 mg | ORAL_TABLET | Freq: Every day | ORAL | 5 refills | Status: DC

## 2022-05-26 MED ORDER — METOPROLOL SUCCINATE ER 50 MG PO TB24: 50.0000 mg | ORAL_TABLET | Freq: Every day | ORAL | 3 refills | Status: DC

## 2022-05-26 NOTE — Progress Notes (Signed)
ADVANCED HEART FAILURE CLINIC NOTE  Referring Physician: [name], MD  Primary Care: [name], MD Primary Cardiologist: [name] EP: Dr. Quentin Ore  HPI: [name] is a 56 y.o. male with HFrEF (dx 2015) w/ LHC demonstrating normal coronary anatomy s/p primary prevention ICD, CKDIIIB & T2DM. He has been followed outpatient by [name]. In 2017 he had a CPX with w/ moderate functional impairment, peak VO2 of 15, VE/VCO2 of 35. Outpatient he has been on a stable regimen of Entresto/coreg/farxiga. From a functional standpoint, [name] reports running 2-3 miles daily until he was diagnosed with 'long covid' 7 months ago. Since that time he has seen a sharp decline in exercise capacity. In November 2023, he was admitted for VT believed to be secondary to significant electrolyte derrangements from and underlying right gluteal abscess now s/p IV antibiotics. Throughout his admission he had recurrent device shocks for VT. He underwent RHC/LHC that demonstrated normal coronary anatomy with RHC showing normal cardiac output and low filling pressures. He subsequently underwent placement of right atrial lead with improvement in ectopy after atrial pacing. TTE during admission with LVEF of 30-35%.   Interval history:  Since discharge [name] is having significant anxiety and panic attacks.  He reports becoming short of breath after walking 50 feet but is unsure if this is due to underlying anxiety or heart failure.  He continues to sleep on 4-5 pillows at night because of fear of recurrent ICD shocks.  Activity level/exercise tolerance: NYHA IIb-III, limited by anxiety and shortness of breath. Orthopnea:  Sleeps on 4-5 pillows Paroxysmal noctural dyspnea:  Yes Chest pain/pressure:  No Orthostatic lightheadedness:  No Palpitations:  No Lower extremity edema:  No Presyncope/syncope:  No Cough:  No  Past Medical History:  Diagnosis Date   AICD (automatic  cardioverter/defibrillator) present    Chronic bronchitis ([place])    "get it most q yr" (12/23/2013)   Chronic systolic (congestive) heart failure ([place])    Fracture of left humerus    a. 07/2013.   GERD (gastroesophageal reflux disease)    not at present time   Heart murmur    "born w/it"    History of renal calculi    Hypertension    Kidney stones    NICM (nonischemic cardiomyopathy) ([place])    a. 07/2013 Echo: EF 20-25%, diff HK, Gr2 DD, mild MR, mod dil LA/RA. 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     amiodarone (PACERONE) 400 MG tablet Take 1 tablet (400 mg total) by mouth daily. 30 tablet 2   aspirin 81 MG tablet Take 81 mg by mouth daily.     atorvastatin (LIPITOR) 10 MG tablet Take 1 tablet (10 mg total) by mouth daily. 90 tablet 1   Blood Glucose Monitoring Suppl (ACCU-CHEK GUIDE) w/Device KIT UAD 1 kit 0   cetirizine (ZYRTEC) 10 MG tablet Take 1 tablet (10 mg total) by mouth daily. 90 tablet 1   chlorpheniramine-HYDROcodone (TUSSIONEX) 10-8 MG/5ML  Take 5 mLs by mouth every 12 (twelve) hours as needed for cough. 115 mL 0   cholecalciferol (VITAMIN D3) 25 MCG (1000 UNIT) tablet Take 1,000 Units by mouth daily.     clobetasol ointment (TEMOVATE) 0.96 % Apply 1 Application topically 2 (two) times daily. 30 g 3   Continuous Blood Gluc Sensor (FREESTYLE LIBRE 2 SENSOR) MISC Use to check blood sugar TID. Change sensor once every 14 days. E11.65 2 each 3   dapagliflozin propanediol (FARXIGA) 5 MG TABS tablet Take 1  tablet (5 mg total) by mouth daily before breakfast. 90 tablet 1   EPINEPHrine 0.3 mg/0.3 mL IJ SOAJ injection Inject 0.3 mg into the muscle as needed. 1 each 1   famotidine (PEPCID) 20 MG tablet Take 1 tablet by mouth 1-2 times a day as needed. 90 tablet 1   fluticasone (FLONASE) 50 MCG/ACT nasal spray Use 1-2 sprays in each nostril once a day as needed for nasal congestion. 16 g 5   furosemide (LASIX) 80 MG tablet Take 1 tablet (80 mg total) by mouth daily as needed for edema or fluid. 180 tablet 1   gabapentin (NEURONTIN) 300 MG capsule TAKE 1 CAPSULE BY MOUTH EVERY DAY AT BEDTIME 90 capsule 1   glucose blood (ACCU-CHEK GUIDE) test strip Use as instructed 100 each 12   insulin aspart (NOVOLOG FLEXPEN) 100 UNIT/ML FlexPen Inject 10 Units into the skin 2 (two) times daily before a meal. 15 mL 2   insulin glargine (LANTUS SOLOSTAR) 100 UNIT/ML Solostar Pen Inject 30 Units into the skin daily. 15 mL 1   Insulin Pen Needle (PEN NEEDLES) 31G X 8 MM MISC UAD 100 each 6   ipratropium-albuterol (DUONEB) 0.5-2.5 (3) MG/3ML SOLN Take 3 mLs by nebulization every 6 (six) hours as needed. 360 mL 2   Lancet Devices (ACCU-CHEK SOFTCLIX) lancets 1 each by Other route 3 (three) times daily. 1 each 0   methocarbamol (ROBAXIN) 500 MG tablet TAKE 1 TABLET BY MOUTH ONCE DAILY AS NEEDED FOR MUSCLE SPASM 30 tablet 0   metoprolol succinate (TOPROL-XL) 25 MG 24 hr tablet Take 1 tablet (25 mg total) by mouth daily. 30 tablet 2   mometasone (ELOCON) 0.1 % cream Apply 1 application topically daily. 45 g 1   potassium chloride SA (KLOR-CON M) 20 MEQ tablet Take 1 tablet (20 mEq total) by mouth 2 (two) times daily. 60 tablet 5   sacubitril-valsartan (ENTRESTO) 24-26 MG Take 1 tablet by mouth 2 (two) times daily. 180 tablet 1   sildenafil (VIAGRA) 100 MG tablet Take 1-2 tabs PO 1/2-1 hr prior to intercourse. 20 tablet 4   spironolactone (ALDACTONE) 25 MG tablet Take 1 tablet (25 mg total) by mouth daily. 30 tablet 5    tamsulosin (FLOMAX) 0.4 MG CAPS capsule Take 2 capsules (0.8 mg total) by mouth daily. 180 capsule 1   triamcinolone cream (KENALOG) 0.1 % Apply sparingly to red itchy areas twice daily as needed. Avoid face, neck, armpits or groin area. Do not use more than 3 weeks in a row. 45 g 5   No current facility-administered medications for this encounter.    Allergies  Allergen Reactions   Bee Venom Anaphylaxis   Trulicity [Dulaglutide] Diarrhea    Patient experience onset of diarrhea with using this medication - cleared after med stopped.      Social History   Socioeconomic History   Marital status: Single    Spouse name: Not on file   Number of children: Not  on file   Years of education: Not on file   Highest education level: Not on file  Occupational History   Occupation: Disabled  Tobacco Use   Smoking status: Former    Packs/day: 1.00    Years: 28.00    Total pack years: 28.00    Types: Cigarettes    Quit date: 06/03/2014    Years since quitting: 7.9   Smokeless tobacco: Never  Vaping Use   Vaping Use: Never used  Substance and Sexual Activity   Alcohol use: Yes   Drug use: Yes    Types: Marijuana    Comment: occasionally   Sexual activity: Yes  Other Topics Concern   Not on file  Social History Narrative   Pt lives with his brother    Social Determinants of Health   Financial Resource Strain: High Risk (05/08/2022)   Overall Financial Resource Strain (CARDIA)    Difficulty of Paying Living Expenses: Very hard  Food Insecurity: No Food Insecurity (05/14/2022)   Hunger Vital Sign    Worried About Running Out of Food in the Last Year: Never true    Ran Out of Food in the Last Year: Never true  Transportation Needs: No Transportation Needs (05/06/2022)   PRAPARE - Hydrologist (Medical): No    Lack of Transportation (Non-Medical): No  Physical Activity: Not on file  Stress: Not on file  Social Connections: Moderately Integrated  (03/12/2021)   Social Connection and Isolation Panel [NHANES]    Frequency of Communication with Friends and Family: More than three times a week    Frequency of Social Gatherings with Friends and Family: More than three times a week    Attends Religious Services: More than 4 times per year    Active Member of [employer] or Organizations: Yes    Attends Archivist Meetings: Never    Marital Status: Never married  Intimate Partner Violence: Not At Risk (05/06/2022)   Humiliation, Afraid, Rape, and Kick questionnaire    Fear of Current or Ex-Partner: No    Emotionally Abused: No    Physically Abused: No    Sexually Abused: No      Family History  Problem Relation Age of Onset   Diabetes Father    Arthritis Father    Hypertension Father    Diabetes Mother    Arthritis Mother    Hypertension Mother    Hypertension Brother     PHYSICAL EXAM: Vitals:   05/26/22 1032  BP: (!) 98/58  Pulse: 74  SpO2: 96%   GENERAL: Well nourished, well developed; appears anxious HEENT: Negative for arcus senilis or xanthelasma. There is no scleral icterus.  The mucous membranes are pink and moist.   NECK: Supple, No masses. Normal carotid upstrokes without bruits. No masses or thyromegaly.    CHEST: There are no chest wall deformities. There is no chest wall tenderness. Respirations are unlabored.  Lungs- CTA B/L CARDIAC:  JVP: <7 cm H2O         Normal S1, S2  Normal rate with regular rhythm. No murmurs, rubs or gallops.  Pulses are 2+ and symmetrical in upper and lower extremities. no edema.  ABDOMEN: Soft, non-tender, non-distended. There are no masses or hepatomegaly. There are normal bowel sounds.  EXTREMITIES: Warm and well perfused with no cyanosis, clubbing.  LYMPHATIC: No axillary or supraclavicular lymphadenopathy.  NEUROLOGIC: Patient is oriented x3 with no focal or lateralizing neurologic deficits.  PSYCH: Patients affect is appropriate,  there is no evidence of anxiety or  depression.  SKIN: Warm and dry; no lesions or wounds.   DATA REVIEW  ECG:NSR  ECHO: 05/06/2022: LVEF 30-35%, normal RV function.  02/16/19: LVEF 45-50%, normal RV function.   CATH: 05/12/22: HEMODYNAMICS: RA:                  1 mmHg (mean) RV:                  22/1-3 mmHg PA:                  23/6 mmHg (12 mean) PCWP:            4 mmHg (mean)                                      Estimated Fick CO/CI   7 L/min, 2.9 L/min/m2                                                 TPG                 8  mmHg                                              PVR                 ~1 Wood Units  PAPi                >5       IMPRESSION: Low pre and post capillary filling pressures.  Normal cardiac output/cardiac index.  Normal PVR & PA mean No obstructive CAD, left dominant w/ large Lcx supplying multiple large marginals.    ASSESSMENT & PLAN:  Heart failure with reduced EF, Stage C, NYHA IIB-III Etiology of QZ:ESPQZRAQTMA as demonstrated by coronary angiography; no family history. If he continues to have recurrent VT will consider evaluation for sarcoidosis. Device interrogation w/o arrhythmias NYHA class / AHA Stage:IIB-III, unsure if SOB is from underlying anxiet of CHF Volume status & Diuretics: Lasix prn only; while in the hospital patient had severe orthopnea and SOB during RHC when attempting to lay flat, however, filling pressures were low. I believe these symptoms are more from anxiety than volume overload Vasodilators:Entresto 24/9m BID Beta-Blocker:Increase Toprol to 594mMRUQJ:FHLKTGYBpironolactone to 12.46m70maily; repeat BMET today. If K is still elevated will discontinue Cardiometabolic:Increase farxiga to 93m58mily Devices therapies & Valvulopathies:dcICD Advanced therapies: CPX in 2017 peak VO2 of 15 AND ve/vco2 of 35 but with submaximal effort; consistent with moderate HF limitations. Advanced therapies at this time limited by CKD and social barriers. Start cardiac  rehab  2. VT - Device interrogation pending   3. T2DM - Farxiga, insulin  - Followed by his PCP  4. CKD3B - Continue farxiga and Entresto - repeat labs pending  5. Panic attacks - start Prozac. Follow up with psychiatry scheduled  6. Gluteal cellulitis  - reports recurrent pain and swelling; followed by general surgery while inpatient. Will schedule outpatient follow up.   Aditya [name] Advanced Heart Failure Mechanical Circulatory Support

## 2022-05-26 NOTE — Addendum Note (Signed)
Encounter addended by: [name], LCSW on: 05/26/2022 2:57 PM  Actions taken: Clinical Note Signed

## 2022-05-26 NOTE — Patient Instructions (Addendum)
INCREASE Toprol to 50mg  daily  INCREASE  Farxiga to 10mg  daily.   DECREASE Spironolactone to 12.5mg  (1/2 Tab) daily   START Prozac 10 mg daily.  Labs done today, your results will be available in MyChart, we will contact you for abnormal readings.  Your physician has requested that you have an echocardiogram. Echocardiography is a painless test that uses sound waves to create images of your heart. It provides your doctor with information about the size and shape of your heart and how well your heart's chambers and valves are working. This procedure takes approximately one hour. There are no restrictions for this procedure. Please do NOT wear cologne, perfume, aftershave, or lotions (deodorant is allowed). Please arrive 15 minutes prior to your appointment time.  PLEASE CALL YOUR FAMILY DOCTOR TO HAVE THEM FOLLOW UP WITH GENERAL SURGERY FOR YOU.  Please follow up with our heart failure pharmacist as scheduled.  Your physician recommends that you schedule a follow-up appointment in: 3 months with an echocardiogram   If you have any questions or concerns before your next appointment please send [place] a message through [place] or call our office at [phone].    TO LEAVE A MESSAGE FOR THE NURSE SELECT OPTION 2, PLEASE LEAVE A MESSAGE INCLUDING: YOUR NAME DATE OF BIRTH CALL BACK NUMBER REASON FOR CALL**this is important as we prioritize the call backs  YOU WILL RECEIVE A CALL BACK THE SAME DAY AS LONG AS YOU CALL BEFORE 4:00 PM  At the [facility], you and your health needs are our priority. As part of our continuing mission to provide you with exceptional heart care, we have created designated Provider Care Teams. These Care Teams include your primary Cardiologist (physician) and Advanced Practice Providers (APPs- Physician Assistants and Nurse Practitioners) who all work together to provide you with the care you need, when you need it.   You may see any of the following  providers on your designated Care Team at your next follow up: Dr Glori Bickers Dr Loralie Champagne Dr. Roxana Hires, NP Lyda Jester, [place] [facility] [place], [place] Forestine Na, NP Audry Riles, PharmD   Please be sure to bring in all your medications bottles to every appointment.

## 2022-05-26 NOTE — Progress Notes (Signed)
Heart and Vascular Care Navigation  05/26/2022  [name] 1966-03-20 [id number]  Reason for Referral: CSW met with pt during clinic visit to follow up on electric bill assistance.  CSW confirmed that check request is being processed and should be arriving to [place] within the next week. Pt reports he should have $1000 check to help with paying remainder tomorrow.  Pt then independently reports issues with severe anxiety.  States another office referred him to counseling so waiting on scheduling.  CSW provided active listening and discussed pt coping mechanisms.  Pt reports he does controlled breathing and can call his brother or sister when he is having these events and this seems to help somewhat.  Mainly anxious about having his ICD going off again (which happened in the hospital).  Endorsed that this is very common reaction to having been shocked and encouraged pt to have a plan already established for if this happens again so he can take away some of the stress of the unknown.  Encouraged pt to reach out if he needs help connecting to alternate resources.   Patient is participating in a Managed Medicaid Plan:Yes  Engaged with patient face to face for follow up visit for Heart and Vascular Care Coordination.                                                                                                   Assessment:                                      HRT/VAS Care Coordination     Patients Home Cardiology Office Heart Failure Clinic   Outpatient Care Team Social Worker   Social Worker Name: Tammy Sours   Living arrangements for the past 2 months Single Family Home   Lives with: Self   Patient Current Insurance Coverage Medicaid   Patient Has Concern With Paying Medical Bills No   Does Patient Have Prescription Coverage? Yes   Home Assistive Devices/Equipment Other (Comment)  multiple items as he and brother are caregiver for mother   DME Agency [employer]; [place], [place])       Social History:                                                                             [place]: No Food Insecurity (05/14/2022)  Housing: Low Risk  (05/14/2022)  Transportation Needs: No Transportation Needs (05/06/2022)  Utilities: At Risk (05/08/2022)  Alcohol Screen: Low Risk  (10/13/2018)  Depression (PHQ2-9): Medium Risk (03/04/2022)  Financial Resource Strain: High Risk (05/08/2022)  Social Connections: Moderately Integrated (03/12/2021)  Tobacco Use: Medium Risk (05/26/2022)    Follow-up plan:  No further needs at this time  [name], [facility] Desk#: [phone] Cell#: [phone]

## 2022-05-27 ENCOUNTER — Telehealth (HOSPITAL_COMMUNITY): Payer: Self-pay

## 2022-05-27 ENCOUNTER — Other Ambulatory Visit (HOSPITAL_COMMUNITY): Payer: Self-pay

## 2022-05-27 ENCOUNTER — Ambulatory Visit: Payer: Self-pay | Admitting: *Deleted

## 2022-05-27 DIAGNOSIS — I5022 Chronic systolic (congestive) heart failure: Secondary | ICD-10-CM

## 2022-05-27 NOTE — Telephone Encounter (Signed)
Patient aware and agreeable.  He did ask if we could prescribe a low dose Ativan while the Prozac is getting into his system. I informed him most likely he will need to go to PCP, but said I would check with you.

## 2022-05-27 NOTE — Telephone Encounter (Signed)
Chief Complaint: anxiety and panic attacks since recent hospitalization, requesting medication   Symptoms: anxious, c/o pressure area to buttocks from 15 day hospital stay. Multiple health issues requiring heart surgery and now after discharge feels anxious with no relief.  Frequency: after 05/03/22 hospital stay approx 05/17/22 Pertinent Negatives: Patient denies chest pain no difficulty breathing reported. No report of hurting self or other. No sweating no trouble sleeping reported Disposition: [] ED /[x] Urgent Care (no appt availability in office) / [] Appointment(In office/virtual)/ []  [facility]/ [] Home Care/ [] Refused Recommended Disposition /[] [facility]/ []  Follow-up with PCP Additional Notes:   Recommended Urgent [place] and gave # and address to patient. Patient requesting if PCP will give medication ativan or xanax until prozac prescribed by cardiologist starts to work. Patient reports he has not started medication at this time. Please advise unsure of disposition.       Reason for Disposition  MODERATE anxiety (e.g., persistent or frequent anxiety symptoms; interferes with sleep, school, or work)  Answer Assessment - Initial Assessment Questions 1. CONCERN: "Did anything happen that prompted you to call today?"      Anxious s/p hospital discharge recent heart surgery  2. ANXIETY SYMPTOMS: "Can you describe how you (your loved one; patient) have been feeling?" (e.g., tense, restless, panicky, anxious, keyed up, overwhelmed, sense of impending doom).      Anxious, after multiple health issues requiring heart surgery and 15 day hospital stay  3. ONSET: "How long have you been feeling this way?" (e.g., hours, days, weeks)     Since discharge from hospital  4. SEVERITY: "How would you rate the level of anxiety?" (e.g., 0 - 10; or mild, moderate, severe).     Calling for medication until prozac gets in system . Has not started medication yet  5. FUNCTIONAL  IMPAIRMENT: "How have these feelings affected your ability to do daily activities?" "Have you had more difficulty than usual doing your normal daily activities?" (e.g., getting better, same, worse; self-care, school, work, interactions)     Difficulty with mobility due to pressure area to buttocks area 6. HISTORY: "Have you felt this way before?" "Have you ever been diagnosed with an anxiety problem in the past?" (e.g., generalized anxiety disorder, panic attacks, PTSD). If Yes, ask: "How was this problem treated?" (e.g., medicines, counseling, etc.)     See hx  7. RISK OF HARM - SUICIDAL IDEATION: "Do you ever have thoughts of hurting or killing yourself?" If Yes, ask:  "Do you have these feelings now?" "Do you have a plan on how you would do this?"     Denies  8. TREATMENT:  "What has been done so far to treat this anxiety?" (e.g., medicines, relaxation strategies). "What has helped?"     Is to start taking prozac  9. TREATMENT - THERAPIST: "Do you have a counselor or therapist? Name?"     na 10. POTENTIAL TRIGGERS: "Do you drink caffeinated beverages (e.g., coffee, colas, teas), and how much daily?" "Do you drink alcohol or use any drugs?" "Have you started any new medicines recently?"       na 11. PATIENT SUPPORT: "Who is with you now?" "Who do you live with?" "Do you have family or friends who you can talk to?"        Yes brother  5. OTHER SYMPTOMS: "Do you have any other symptoms?" (e.g., feeling depressed, trouble concentrating, trouble sleeping, trouble breathing, palpitations or fast heartbeat, chest pain, sweating, nausea, or diarrhea)  Denies , reports pressure area to buttocks causing discomfort  13. PREGNANCY: "Is there any chance you are pregnant?" "When was your last menstrual period?"       na  Protocols used: Anxiety and Panic Attack-A-AH

## 2022-05-27 NOTE — Telephone Encounter (Signed)
I spoke to patient and informed him he will need to get with his PCP for Ativan prescription.He is agreeable.

## 2022-05-28 ENCOUNTER — Emergency Department (HOSPITAL_BASED_OUTPATIENT_CLINIC_OR_DEPARTMENT_OTHER): Payer: Medicaid Other | Admitting: Radiology

## 2022-05-28 ENCOUNTER — Emergency Department (HOSPITAL_BASED_OUTPATIENT_CLINIC_OR_DEPARTMENT_OTHER)
Admission: EM | Admit: 2022-05-28 | Discharge: 2022-05-29 | Disposition: A | Discharge status: Home / Self Care | Payer: Medicaid Other | Attending: Emergency Medicine | Admitting: Emergency Medicine

## 2022-05-28 ENCOUNTER — Encounter (HOSPITAL_BASED_OUTPATIENT_CLINIC_OR_DEPARTMENT_OTHER): Payer: Self-pay

## 2022-05-28 ENCOUNTER — Ambulatory Visit (HOSPITAL_COMMUNITY)
Admission: EM | Admit: 2022-05-28 | Discharge: 2022-05-28 | Disposition: A | Discharge status: Home / Self Care | Payer: Medicaid Other | Attending: Psychiatry | Admitting: Psychiatry

## 2022-05-28 ENCOUNTER — Other Ambulatory Visit: Payer: Self-pay

## 2022-05-28 DIAGNOSIS — R002 Palpitations: Secondary | ICD-10-CM | POA: Insufficient documentation

## 2022-05-28 DIAGNOSIS — F419 Anxiety disorder, unspecified: Secondary | ICD-10-CM | POA: Diagnosis not present

## 2022-05-28 DIAGNOSIS — Z7982 Long term (current) use of aspirin: Secondary | ICD-10-CM | POA: Diagnosis not present

## 2022-05-28 DIAGNOSIS — F411 Generalized anxiety disorder: Secondary | ICD-10-CM | POA: Insufficient documentation

## 2022-05-28 DIAGNOSIS — R0602 Shortness of breath: Secondary | ICD-10-CM | POA: Diagnosis present

## 2022-05-28 DIAGNOSIS — Z95810 Presence of automatic (implantable) cardiac defibrillator: Secondary | ICD-10-CM | POA: Insufficient documentation

## 2022-05-28 DIAGNOSIS — R079 Chest pain, unspecified: Secondary | ICD-10-CM | POA: Diagnosis not present

## 2022-05-28 DIAGNOSIS — Z794 Long term (current) use of insulin: Secondary | ICD-10-CM | POA: Diagnosis not present

## 2022-05-28 DIAGNOSIS — Z8674 Personal history of sudden cardiac arrest: Secondary | ICD-10-CM | POA: Insufficient documentation

## 2022-05-28 DIAGNOSIS — J811 Chronic pulmonary edema: Secondary | ICD-10-CM | POA: Diagnosis not present

## 2022-05-28 LAB — CBC
HCT: 38.4 % — ABNORMAL LOW (ref 39.0–52.0)
Hemoglobin: 12.4 g/dL — ABNORMAL LOW (ref 13.0–17.0)
MCH: 29.9 pg (ref 26.0–34.0)
MCHC: 32.3 g/dL (ref 30.0–36.0)
MCV: 92.5 fL (ref 80.0–100.0)
Platelets: 251 10*3/uL (ref 150–400)
RBC: 4.15 MIL/uL — ABNORMAL LOW (ref 4.22–5.81)
RDW: 14.5 % (ref 11.5–15.5)
WBC: 5.9 10*3/uL (ref 4.0–10.5)
nRBC: 0 % (ref 0.0–0.2)

## 2022-05-28 LAB — BASIC METABOLIC PANEL
Anion gap: 11 (ref 5–15)
BUN: 25 mg/dL — ABNORMAL HIGH (ref 6–20)
CO2: 20 mmol/L — ABNORMAL LOW (ref 22–32)
Calcium: 9.1 mg/dL (ref 8.9–10.3)
Chloride: 106 mmol/L (ref 98–111)
Creatinine, Ser: 2.22 mg/dL — ABNORMAL HIGH (ref 0.61–1.24)
GFR, Estimated: 34 mL/min — ABNORMAL LOW (ref >60)
Glucose, Bld: 198 mg/dL — ABNORMAL HIGH (ref 70–99)
Potassium: 5.7 mmol/L — ABNORMAL HIGH (ref 3.5–5.1)
Sodium: 137 mmol/L (ref 135–145)

## 2022-05-28 LAB — TROPONIN I (HIGH SENSITIVITY): Troponin I (High Sensitivity): 5 ng/L (ref <18)

## 2022-05-28 MED ORDER — LORAZEPAM 1 MG PO TABS: 1.0000 mg | ORAL_TABLET | Freq: Once | ORAL | Status: DC

## 2022-05-28 MED ORDER — LORAZEPAM 1 MG PO TABS
0.5000 mg | ORAL_TABLET | Freq: Once | ORAL | Status: AC
  Administered 2022-05-28: 0.5 mg via ORAL
  Filled 2022-05-28: qty 1

## 2022-05-28 MED ORDER — LORAZEPAM 0.5 MG PO TABS: 0.5000 mg | ORAL_TABLET | Freq: Four times a day (QID) | ORAL | 0 refills | Status: DC | PRN

## 2022-05-28 NOTE — ED Notes (Signed)
Pt in room with family member. Pt reports weeks of anxiety and panic attacks since being discharged from the hospital with defibrillator surgery. Pt sts that he starts "feeling crazy in my head", SOB and shaking. Usually pt does breathing exercises and feels better until the next attack. Reports 6 today and 4 yesterday. Contacted PCP about getting anxiety meds but could not get appt until Friday. Pt comfortable in bed, on monitor, warm blanket given.

## 2022-05-28 NOTE — ED Triage Notes (Signed)
Pt presents to [facility] voluntarily due to increased panic attacks. Pt reports last panic attack was about 20 minutes ago. Pt reports he was prescribed prozac today for his symptoms by his cardiologist and took one dosage. Pt reports having heart surgery about 2 weeks ago due to issues with his defibrillator. Pt reports unstable sleeping patterns, crying spells, nightmares. Pt states he would like medication such as Xanax or ativan for immediate relief. Pt states he has an appointment with his PCP in 2 days.Pt denies SI/HI and AVH.

## 2022-05-28 NOTE — ED Provider Notes (Signed)
Behavioral Health Urgent Care Medical Screening Exam  Patient Name: [name] MRN: [id number] Date of Evaluation: 05/28/22 Chief Complaint: "4 to 5 panic attacks today" Diagnosis:  Final diagnoses:  Anxiety state   History of Present illness: [name] is a 56 y.o. male. Pt presents voluntarily to [facility] behavioral health for walk-in assessment.  Pt is accompanied by his brother, who remains with pt throughout assessment as per pt verbal request/consent. Pt is assessed face-to-face by nurse practitioner.   [name], 56 y.o., male patient seen face to face by this provider, and chart reviewed on 05/28/22.  On evaluation [name] reports he is presenting today due to "4 to 5 panic attacks today". He reports he has been experiencing panic attacks since discharge from the hospital late last month. 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 He reports he was started on prozac by his cardiologist. He took his first dose of prozac today. He was hoping to get a short term prescription for ativan or xanax. He states he has an appointment with his primary care provider on Friday.   Pt denies suicidal, homicidal or violent ideations. He denies auditory visual hallucinations or paranoia.   Pt denies history of non suicidal self injurious behavior, suicide attempt, or inpatient psychiatric hospitalization.   Pt is living with his brother.  Pt denies alcohol, marijuana, other substance use.  Pt receiving medication management from his cardiologist, started prozac today. He denies he is connected with counseling.  Pt is not currently employed.  Pt reports family psychiatric history is positive, twin brother with schizophrenia.  Discussed am unable to provide prescription for ativan or xanax. Pt's brother requesting whether prescription for Lorrin Mais is possible. Discussed unable to provide prescription for  ambien. Discussed following up with primary care provider or establishing outpatient medication management services with psychiatry. Discussed open access hours at [facility]. Information also provided on discharge paperwork.  [place] ED from 05/28/2022 in [facility] ED to Hosp-Admission (Discharged) from 05/03/2022 in [facility]E [facility] [place] ED from 04/04/2022 in [place] DEPT  C-SSRS RISK CATEGORY No Risk No Risk No Risk       Psychiatric Specialty Exam  Presentation  General Appearance:Appropriate for Environment; Casual; Fairly Groomed  Eye Contact:Fair  Speech:Clear and Coherent; Normal Rate  Speech Volume:Normal  Handedness:Right   Mood and Affect  Mood:Anxious  Affect:Congruent   Thought Process  Thought Processes:Coherent; Goal Directed; Linear  Descriptions of Associations:Intact  Orientation:Full (Time, Place and Person)  Thought Content:Logical    Hallucinations:None  Ideas of Reference:None  Suicidal Thoughts:No  Homicidal Thoughts:No   Sensorium  Memory:Immediate Good  Judgment:Good  Insight:Good   Executive Functions  Concentration:Good  Attention Span:Good  Recall:Good  Fund of Knowledge:Good  Language:Good   Psychomotor Activity  Psychomotor Activity:Normal   Assets  Assets:Communication Skills; Desire for Improvement; Financial Resources/Insurance; Housing; Social Support; Resilience; Transportation   Sleep  Sleep:Poor  Number of hours: No data recorded  No data recorded  Physical Exam: Physical Exam Constitutional:      General: He is not in acute distress.    Appearance: He is not ill-appearing, toxic-appearing or diaphoretic.  Eyes:     General: No scleral icterus. Cardiovascular:     Rate and Rhythm: Normal rate.  Pulmonary:     Effort: Pulmonary effort is normal. No respiratory distress.  Neurological:  Mental Status: He is  alert and oriented to person, place, and time.  Psychiatric:        Attention and Perception: Attention and perception normal.        Mood and Affect: Mood is anxious.        Speech: Speech normal.        Behavior: Behavior normal. Behavior is cooperative.        Thought Content: Thought content normal.        Cognition and Memory: Cognition and memory normal.        Judgment: Judgment normal.   Review of Systems  Constitutional:  Negative for chills and fever.  Respiratory:  Negative for shortness of breath.   Cardiovascular:  Negative for chest pain and palpitations.  Gastrointestinal:  Negative for abdominal pain.  Neurological:  Negative for headaches.  Psychiatric/Behavioral:  The patient is nervous/anxious.    Blood pressure 114/79, pulse 75, temperature 97.6 F (36.4 C), temperature source Oral, resp. rate 16, SpO2 100 %. There is no height or weight on file to calculate BMI.  Musculoskeletal: Strength & Muscle Tone: within normal limits Gait & Station: normal Patient leans: N/A  Grandfather MSE Discharge Disposition for Follow up and Recommendations: Based on my evaluation the patient does not appear to have an emergency medical condition and can be discharged with resources and follow up care in outpatient services for Medication Management and Individual Therapy  Tharon Aquas, NP 05/28/2022, 4:13 PM

## 2022-05-28 NOTE — Discharge Instructions (Signed)
1.  At this time your symptoms are being attributed to anxiety.  However, you have significant medical problems and if you develop chest pain, lightheadedness or other concerning changes, return to the emergency department immediately for recheck. 2.  You have been given a prescription for half a milligram of Ativan.  You may take a tablet every 6 hours if needed for anxiety.

## 2022-05-28 NOTE — Discharge Instructions (Signed)
Please come to [facility] (this facility, SECOND FLOOR) during walk in hours for appointment with psychiatrist/provider for further medication management and for therapists for therapy.   Walk in hours for therapy/counseling: Monday through Thursday 8AM until slots are full. Every Friday 1PM-4PM.  Walk in hours for psychiatry/medication management: Monday through Friday 8AM-11AM.   When you arrive please go upstairs for your appointment. If you are unsure of where to go, inform the front desk that you are here for a walk in appointment and they will assist you with directions upstairs.  Address:  [address], in [place], [address] Ph: [phone]   

## 2022-05-28 NOTE — ED Triage Notes (Signed)
Patient here POV from Home.  Endorses CP and SOB that began today. Patient has also been having 3-6 Panic Attacks over the past few days.   Recently admitted and discharged 2 Weeks ago for Cardiac Arrest due to Hypokalemia.   NAD Noted during Triage. A&Ox4. GCS 15. Ambulatory.

## 2022-05-28 NOTE — Discharge Summary (Signed)
[name] to be D/C'd Home per NP order. An After Visit Summary was printed and given to the patient by provider. Patient escorted out and D/C home via private auto.  Clois Dupes  05/28/2022 3:51 PM

## 2022-05-29 ENCOUNTER — Telehealth (HOSPITAL_BASED_OUTPATIENT_CLINIC_OR_DEPARTMENT_OTHER): Payer: Self-pay | Admitting: Emergency Medicine

## 2022-05-29 MED ORDER — LORAZEPAM 0.5 MG PO TABS: 0.5000 mg | ORAL_TABLET | Freq: Four times a day (QID) | ORAL | 0 refills | Status: DC | PRN

## 2022-05-29 NOTE — Telephone Encounter (Signed)
[facility] on [address]  did not have Ativan.  Changed prescription to [facility] on [address].  Reviewed drug database and had not been filled.

## 2022-05-29 NOTE — ED Provider Notes (Signed)
[place] EMERGENCY DEPT Provider Note   CSN: [id number] Arrival date & time: 05/28/22  1636     History  Chief Complaint  Patient presents with   Shortness of Breath    [name] is a 56 y.o. male.  HPI Patient reports he has been having symptoms of panic and anxiety since a hospitalization due to cardiac arrest and multiple shocks from his defibrillator.  He reports at one point he was shocked multiple times.  He reports since that he has been getting episodes of feeling like he is "going crazy".  He starts to feel short of breath, anxious and lightheaded.  This happens multiple times a day.  Patient reports that he did follow-up with his cardiologist and they have started him on an antidepressant citing that his symptoms are due to anxiety.  He reports that they have checked the defibrillator there were no problems with dysrhythmia or evidence of ischemic heart disease.  She reports that he is going to be following up with his primary care doctor within 2 days and would like to get a few doses of Ativan so he can sleep at night and make it to see his doctor on Friday.  Denies he had fever chills or cough.  He has not been having swelling or pain in his legs or his calves.    Home Medications Prior to Admission medications   Medication Sig Start Date End Date Taking? Authorizing Provider  LORazepam (ATIVAN) 0.5 MG tablet Take 1 tablet (0.5 mg total) by mouth every 6 (six) hours as needed for anxiety. 05/28/22  Yes Charlesetta Shanks, MD  Accu-Chek Softclix Lancets lancets Use as instructed 02/01/22   Cristie Hem, MD  albuterol (VENTOLIN HFA) 108 (90 Base) MCG/ACT inhaler Inhale into the lungs every 6 (six) hours as needed for wheezing or shortness of breath.    [provider]  amiodarone (PACERONE) 400 MG tablet Take 1 tablet (400 mg total) by mouth daily. 05/17/22   Samuella Cota, MD  aspirin 81 MG tablet Take 81 mg by mouth daily.    [provider]  atorvastatin (LIPITOR) 10 MG tablet Take 1 tablet (10 mg total) by mouth daily. 01/19/22   Elsie Stain, MD  Blood Glucose Monitoring Suppl (ACCU-CHEK GUIDE) w/Device KIT UAD 02/01/22   Cristie Hem, MD  cetirizine (ZYRTEC) 10 MG tablet Take 1 tablet (10 mg total) by mouth daily. 01/30/22   Valentina Shaggy, MD  chlorpheniramine-HYDROcodone (TUSSIONEX) 10-8 MG/5ML Take 5 mLs by mouth every 12 (twelve) hours as needed for cough. 03/21/22   Juanito Doom, MD  cholecalciferol (VITAMIN D3) 25 MCG (1000 UNIT) tablet Take 1,000 Units by mouth daily.    [provider]  clobetasol ointment (TEMOVATE) 3.74 % Apply 1 Application topically 2 (two) times daily. 01/30/22   Valentina Shaggy, MD  Continuous Blood Gluc Sensor (FREESTYLE LIBRE 2 SENSOR) MISC Use to check blood sugar TID. Change sensor once every 14 days. E11.65 03/06/22   [name], MD  dapagliflozin propanediol (FARXIGA) 10 MG TABS tablet Take 1 tablet (10 mg total) by mouth daily before breakfast. 05/26/22   [name], Aditya, DO  EPINEPHrine 0.3 mg/0.3 mL IJ SOAJ injection Inject 0.3 mg into the muscle as needed. 01/30/22   Valentina Shaggy, MD  famotidine (PEPCID) 20 MG tablet Take 1 tablet by mouth 1-2 times a day as needed. 02/18/22   Valentina Shaggy, MD  FLUoxetine (PROZAC) 10 MG  capsule Take 1 capsule (10 mg total) by mouth daily. 05/26/22   [name], Aditya, DO  fluticasone (FLONASE) 50 MCG/ACT nasal spray Use 1-2 sprays in each nostril once a day as needed for nasal congestion. 01/30/22   Valentina Shaggy, MD  furosemide (LASIX) 80 MG tablet Take 1 tablet (80 mg total) by mouth daily as needed for edema or fluid. 05/17/22   Samuella Cota, MD  gabapentin (NEURONTIN) 300 MG capsule TAKE 1 CAPSULE BY MOUTH EVERY DAY AT BEDTIME 10/05/20   [name], MD  glucose blood (ACCU-CHEK GUIDE) test strip Use as instructed 02/01/22   Cristie Hem, MD  insulin aspart  (NOVOLOG FLEXPEN) 100 UNIT/ML FlexPen Inject 10 Units into the skin 2 (two) times daily before a meal. 04/07/22   [name], MD  insulin glargine (LANTUS SOLOSTAR) 100 UNIT/ML Solostar Pen Inject 30 Units into the skin daily. 03/21/22   [name], MD  Insulin Pen Needle (PEN NEEDLES) 31G X 8 MM MISC UAD 03/01/22   [name], MD  ipratropium-albuterol (DUONEB) 0.5-2.5 (3) MG/3ML SOLN Take 3 mLs by nebulization every 6 (six) hours as needed. 03/21/22   Juanito Doom, MD  Lancet Devices [facility]) lancets 1 each by Other route 3 (three) times daily. 10/20/14   Funches, Adriana Mccallum, MD  methocarbamol (ROBAXIN) 500 MG tablet TAKE 1 TABLET BY MOUTH ONCE DAILY AS NEEDED FOR MUSCLE SPASM 04/25/22   Rodena Goldmann A, DO  metoprolol succinate (TOPROL-XL) 50 MG 24 hr tablet Take 1 tablet (50 mg total) by mouth daily. 05/26/22   [name], Aditya, DO  mometasone (ELOCON) 0.1 % cream Apply 1 application topically daily. 06/17/19   Marzetta Board, DPM  potassium chloride SA (KLOR-CON M) 20 MEQ tablet Take 1 tablet (20 mEq total) by mouth 2 (two) times daily. 05/09/22   [name], Aditya, DO  sacubitril-valsartan (ENTRESTO) 24-26 MG Take 1 tablet by mouth 2 (two) times daily. 04/21/22   Lelon Perla, MD  sildenafil (VIAGRA) 100 MG tablet Take 1-2 tabs PO 1/2-1 hr prior to intercourse. 12/14/20   [name], MD  spironolactone (ALDACTONE) 25 MG tablet Take 0.5 tablets (12.5 mg total) by mouth daily. 05/26/22   [name], Aditya, DO  tamsulosin (FLOMAX) 0.4 MG CAPS capsule Take 2 capsules (0.8 mg total) by mouth daily. 01/19/22   Elsie Stain, MD  triamcinolone cream (KENALOG) 0.1 % Apply sparingly to red itchy areas twice daily as needed. Avoid face, neck, armpits or groin area. Do not use more than 3 weeks in a row. 01/30/22   Valentina Shaggy, MD      Allergies    Bee venom and Trulicity [dulaglutide]    Review of Systems   Review of  Systems  Physical Exam Updated Vital Signs BP 125/73   Pulse 68   Temp 98.1 F (36.7 C) (Oral)   Resp (!) 27   Ht 6' (1.829 m)   Wt 115.6 kg   SpO2 98%   BMI 34.56 kg/m  Physical Exam Constitutional:      Comments: Alert nontoxic well in appearance.  Well-nourished well-developed.  No respiratory distress.  HENT:     Head: Normocephalic and atraumatic.     Mouth/Throat:     Pharynx: Oropharynx is clear.  Eyes:     Extraocular Movements: Extraocular movements intact.  Cardiovascular:     Rate and Rhythm: Normal rate and regular rhythm.     Comments: Patient has a well-healed pacemaker  site left anterior chest.  No overlying erythema or swelling. Pulmonary:     Effort: Pulmonary effort is normal.     Breath sounds: Normal breath sounds.  Abdominal:     General: There is no distension.     Palpations: Abdomen is soft.     Tenderness: There is no abdominal tenderness. There is no guarding.  Musculoskeletal:        General: No swelling or tenderness. Normal range of motion.     Cervical back: Neck supple.     Right lower leg: No edema.     Left lower leg: No edema.     Comments: No peripheral edema.  Calves are soft and pliable and nontender  Skin:    General: Skin is warm and dry.  Neurological:     General: No focal deficit present.     Mental Status: He is oriented to person, place, and time.     Motor: No weakness.     Coordination: Coordination normal.  Psychiatric:        Mood and Affect: Mood normal.     ED Results / Procedures / Treatments   Labs (all labs ordered are listed, but only abnormal results are displayed) Labs Reviewed  BASIC METABOLIC PANEL - Abnormal; Notable for the following components:      Result Value   Potassium 5.7 (*)    CO2 20 (*)    Glucose, Bld 198 (*)    BUN 25 (*)    Creatinine, Ser 2.22 (*)    GFR, Estimated 34 (*)    All other components within normal limits  CBC - Abnormal; Notable for the following components:   RBC  4.15 (*)    Hemoglobin 12.4 (*)    HCT 38.4 (*)    All other components within normal limits  TROPONIN I (HIGH SENSITIVITY)    EKG EKG Interpretation  Date/Time:  Wednesday May 28 2022 16:43:41 EST Ventricular Rate:  67 PR Interval:  204 QRS Duration: 118 QT Interval:  406 QTC Calculation: 429 R Axis:   208 Text Interpretation: Normal sinus rhythm Right superior axis deviation Cannot rule out Anterior infarct (cited on or before 12-May-2022) Abnormal ECG no sig change from previous Confirmed by Charlesetta Shanks [phone]) on 05/29/2022 3:33:47 PM  Radiology DG Chest 2 View  Result Date: 05/28/2022 CLINICAL DATA:  Chest pain EXAM: CHEST - 2 VIEW COMPARISON:  Chest x-ray 05/16/2022 FINDINGS: Left-sided pacemaker is unchanged in position. The heart is enlarged, unchanged from prior. There is no focal lung consolidation, pleural effusion or pneumothorax. There central pulmonary vascular congestion. No acute fractures are seen. IMPRESSION: Cardiomegaly with central pulmonary vascular congestion. Electronically Signed   By: Ronney Asters M.D.   On: 05/28/2022 17:43    Procedures Procedures    Medications Ordered in ED Medications  LORazepam (ATIVAN) tablet 0.5 mg (0.5 mg Oral Given 05/28/22 2312)    ED Course/ Medical Decision Making/ A&P                           Medical Decision Making Amount and/or Complexity of Data Reviewed Labs: ordered. Radiology: ordered.  Risk Prescription drug management.   Extensive review of EMR.  Patient had follow-up with cardiology 12\15.  He had suffered 22 ICD shocks in the setting of hypokalemia, diarrhea and infection.  His diagnosis was VT storm.  He presented to cardiology with severe anxiety, fearful of repeat defibrillation.  When seen on 12\15  pacer was interrogated and there was no evidence of VT or arrhythmias.  Patient was started on Prozac for anxiety and requested Ativan for help with anxiety until Prozac was effective.  The  patient presents with same symptoms.  He reports there is no difference or change.  On the monitor he has a sinus rhythm controlled in the mid 60s and normal blood pressure 120s over 70s.  Vital signs have been stable throughout the encounter.  Screening lab work shows potassium at 5.7.  Patient reports that he had just reviewed this with his cardiologist and he is holding potassium as of today.  This point, I do not feel any intervention is needed as the EKG is normal and elevated potassium is due to potassium supplement which is being monitored and held currently.  Not likely to be contributory to the patient's symptoms.  Patient went to behavioral health for anxiety and desire to start Ativan until effective management of panic attacks.  This time given extensive diagnostic evaluation by cardiology including pacer interrogation and normal clinical findings in the emergency department with a well-controlled rate, narrow complex I do feel patient is stable to do his follow-up in 2 days.  He has a legitimate cause for panic and anxiety given over 22 defibrillations resulting in his recent hospitalization.  Patient requests 2 days worth of Ativan half milligram tablets to help with sleep and rest until he gets his follow-up with PCP on Friday.  I feel this is appropriate.  Patient is given 1/2 mg dose of Ativan in the emergency department and sufficient home use for the next 2 days.  We have extensively reviewed reviewed return precautions.        Final Clinical Impression(s) / ED Diagnoses Final diagnoses:  Anxiety  Palpitations    Rx / DC Orders ED Discharge Orders          Ordered    LORazepam (ATIVAN) 0.5 MG tablet  Every 6 hours PRN        05/28/22 2345              Charlesetta Shanks, MD 05/29/22 1541

## 2022-05-30 ENCOUNTER — Ambulatory Visit: Payer: Medicaid Other | Attending: Internal Medicine | Admitting: Internal Medicine

## 2022-05-30 DIAGNOSIS — F410 Panic disorder [episodic paroxysmal anxiety] without agoraphobia: Secondary | ICD-10-CM | POA: Diagnosis not present

## 2022-05-30 DIAGNOSIS — R222 Localized swelling, mass and lump, trunk: Secondary | ICD-10-CM

## 2022-05-30 MED ORDER — LORAZEPAM 0.5 MG PO TABS: 0.5000 mg | ORAL_TABLET | Freq: Two times a day (BID) | ORAL | 0 refills | Status: DC | PRN

## 2022-05-30 NOTE — Progress Notes (Signed)
Patient ID: [name], male   DOB: 03-Feb-1966, 56 y.o.   MRN: [id number] Virtual Visit via Video Note  I connected with [name] on 05/30/2022 at 8:55 AM by a video enabled telemedicine application and verified that I am speaking with the correct person using two identifiers.  Location: Patient: home Provider: Office   I discussed the limitations of evaluation and management by telemedicine and the availability of in person appointments. The patient expressed understanding and agreed to proceed.  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  This is an UC visit for request for Ativan or Xanax.  Patient was hospitalized 11/25 - 05/17/2022 with V. tach storm in the setting of hypokalemia from GI losses/severe diarrhea.  Diarrhea thought to be due to Trulicity.  He had 22 ICD shocks and witnessed V. tach arrest in the emergency room.  Hospital course complicated with cellulitis of the buttock, AKI on CKD and polyarthritis thought to be due to gout.  Prior to hospital discharge, patient underwent upgrade of his ICD with atrial lead placement. Since hospital discharge, patient reports he has been having anxiety/panic attacks.  He states that when he is riding in the car or goes to the grocery store and he feels any sensation in his chest he gets very anxious and has a panic attack.  When he gets these episodes he feels like he cannot breathe.  He is fearful of his ICD firing off again.  Denies having any nightmares but states that he plays back the events of what happened in his mind and how it felt and it is just too much and overwhelming for him.   He has seen the cardiologist since hospital discharge and reported his symptoms of anxiety.  He was started on Prozac on 05/26/2022 and referred to behavioral  health.  Patient states he has an appointment with a Dr. Osvaldo Human on 06/26/2021 he thinks for medication management.  Patient called our office earlier this week requesting prescription for Xanax or Ativan to use short-term until Prozac takes effect being told that it can take 4 to 6 weeks.  Advised that he needed an appointment for [place] to discuss. Seen at behavioral health urgent care 05/28/2022 requesting a short-term prescription for Ativan or Xanax.  Patient was told that he needed to be seen by his PCP.  Seen in the emergency room yesterday for the same and was given 10 tablets of lorazepam 0.5 mg to take every 6 hours as needed. Patient tells me he has been taking half a tablet a day and finds it helpful.  He tells me that he had 8 panic attacks the day before yesterday.  He is also requesting a referral to see the surgeon who saw him while he was in the hospital for the infection on the buttock.  He states that it is not about the size of his fist on the cheek of the right buttock which is painful at times but it is not draining.  It has not increased in size since hospitalization.  He has a smaller 1 about the size of a quarter on the left buttock.  I inquired briefly about how his blood sugars are doing.  Patient stated that his blood sugar currently was 125 but after meals can get into the 200s.  Currently on NovoLog 10 units with meals and glargine 30 units daily   Observations/Objective: Older African-American male in NAD.  Flat affect.  Emotional when describing his symptoms.  Assessment and Plan: 1. Panic attacks  I understand the mental trauma that was caused by him being shocked so many times by his ICD causing him to feel anxious.   discussed management of anxiety/panic disorder.  Patient inquiring whether the Prozac will help.  I told him that it will but it usually takes about 4 to 6 weeks before he starts seeing benefits from it.  In the meantime we can have him use clonazepam which I  favor because it is more long-acting than Ativan or Xanax.  Advised that the medication can cause drowsiness, and persons can develop dependence and/or addiction to the medication.  Advised not to take the medicine when having to drive or operate any machinery.  Advised that we would use for short period of time until the Prozac takes effect.   I recommend referral to psychiatrist for medication management and inquired whether the referral made by cardiology was to a psychologist for counseling or to a psychiatrist.  I think he would benefit from both.  He thinks it is to a psychiatrist.  However I see a referral was also made to Dr. Michail Sermon who I think is a psychologist.    patient states he has some lorazepam that was given to him through the emergency room yesterday and feels that this would work for him. I have given a limited supply of lorazepam to use up to twice a day as needed.  The [place] controlled substance reporting system reviewed. - LORazepam (ATIVAN) 0.5 MG tablet; Take 1 tablet (0.5 mg total) by mouth 2 (two) times daily as needed for anxiety.  Dispense: 25 tablet; Refill: 0  2. Mass of buttock - Ambulatory referral to General Surgery  Advised patient that if he decides to follow-up with [place] for his diabetes management, he can call and let us know to request appointment.  Follow Up Instructions:    I discussed the assessment and treatment plan with the patient. The patient was provided an opportunity to ask questions and all were answered. The patient agreed with the plan and demonstrated an understanding of the instructions.   The patient was advised to call back or seek an in-person evaluation if the symptoms worsen or if the condition fails to improve as anticipated.  I spent 24 minutes dedicated to the care of this patient on the date of this encounter to include previsit review of his records, face-to-face time with the patient and postvisit entering of orders.  This note  has been created with Surveyor, quantity. Any transcriptional errors are unintentional.  Karle Plumber, MD

## 2022-06-05 ENCOUNTER — Telehealth (HOSPITAL_COMMUNITY): Payer: Self-pay | Admitting: Licensed Clinical Social Worker

## 2022-06-05 ENCOUNTER — Telehealth: Payer: Self-pay | Admitting: Emergency Medicine

## 2022-06-05 NOTE — Telephone Encounter (Signed)
H&V Care Navigation CSW Progress Note  Clinical Social Worker received call from pt stating that [employer] has not yet received payment from Patient Care fund.  CSW messaged accounts payable and confirmed that check has been mailed.  CSW assisted pt in calling [employer] and speaking with a representative to reassure that payment is coming- they will not set shut off date till next billing cycle on Jan. 27th.  Patient is participating in a Managed Medicaid Plan:  Yes  [place]: No Food Insecurity (05/14/2022)  Housing: Low Risk  (05/14/2022)  Transportation Needs: No Transportation Needs (05/06/2022)  Utilities: At Risk (05/08/2022)  Alcohol Screen: Low Risk  (10/13/2018)  Depression (PHQ2-9): Medium Risk (03/04/2022)  Financial Resource Strain: High Risk (05/08/2022)  Social Connections: Moderately Integrated (03/12/2021)  Tobacco Use: Medium Risk (05/28/2022)    [name], LCSW Clinical Social Worker [facility] Desk#: [phone] Cell#: [phone]

## 2022-06-05 NOTE — Telephone Encounter (Signed)
Copied from [place] [id number]. Topic: General - Other >> Jun 05, 2022  8:43 AM Everette C wrote: Reason for CRM: Eulas Post with [employer] has called to verify a doctors note from Dr. Wynetta Emery written for the patient dated 04/08/22  Eulas Post can be reached at [phone]  Please contact further when possible

## 2022-06-05 NOTE — Telephone Encounter (Signed)
Called & spoke to [facility] with [employer]. I verified the validity of the doctor's note dated 04/08/2022 from [place]. No further questions as of now.

## 2022-06-10 NOTE — Progress Notes (Deleted)
Cardiology Office Note Date:  06/10/2022  Patient ID:  [name], [name] March 23, 1966, MRN [id number] PCP:  [name], MD  Cardiologist:  [name] Electrophysiologist: Dr. Rayann Heman >> Dr. Quentin Ore    Chief Complaint:  post hospital/wound check  History of Present Illness: [name] is a 57 y.o. male with history of NICM/chronic systolic CHF s/p ICD, normal coronary arteries by cath 2015, GERD, HTN and DM2, VT/VF.  Admitted 05/03/22 with VT storm, 22 ICD shocks and 2 ATP, 10 aborted shocks on initial arrival. Felt to be likely in setting of hypokalemia, diarrhea, and infection. However despite had recurrent VT despite management and correction of both and back on amiodarone gtt  >>  PO amiodarone > developed bradycardia and intermittent RV pacing that triggered NSPMVT episodes, ultimately felt to be pause dependent and not pacing triggered. R/LHC looked good, volume stable, no obstructive CAD, LVEF stable 30-35% Underwent device upgrade to a dual chamber device 05/15/22 by Dr. Quentin Ore EP signed off 05/16/22 cleared to start BB of HF team's choice.  Note that initially with diarrhea, gluteal wound?  suspected due to increase in Trulicity dose out patient:  Trulicity discontinued> C. Difficile and GI penl negative.   And got significantly better.     Gen surgery signed off 05/09/22 note: "No signs of abscess or indication for surgery at this time given exam more consistent with cellulitis and recent CT and u/s negative for underlying abscess. Continue antibiotics. If exam becomes more concerning for abscess development could repeat u/s and call us back. We will sign off, please call with questions or concerns." "No real signs of abscess that is drainable. May coalesce to abscess with time.  Would rec [place] to eval and can then drain at that time." Empiric course of  zosyn finished 12/6 11/30- shows region of concern along the right buttock infiltrative subcutaneous edema no  abscess or drainable process observed.  CT scan 11/27 no evidence of abscess or drainable fluid collection.   Surgery signed off, no need for procedure.   Empiric Zosyn will be continued x10 days until 12/6   By our evaluation pre-device upgrade I do not note any open wounds.  Discharged 05/17/22 Toprol 40m daily Amiodarone 4067mBID to 12/9 > 40016maily until f/u and reassessment no SGLT2 w/ perineal infection  PRN lasix  OV 05/23/2022 He has had a couple episodes that he felt like he was either having panic/anxiety or tachycardia. He is scared about getting shocked again. He has not had CP, no near syncope or syncope. With these episodes he feels breathless, and has the sense of palpitations that are reminiscent of how he felt before getting shocked.  TODAY ***  Device information Abbott single chamber ICD implanted 12/23/2013 >> upgraded to dual chamber devce ( A lead added) 05/15/22  + appropriate tx, VT storm  AAD hx Amiodarone started Nov 2023  Past Medical History:  Diagnosis Date   AICD (automatic cardioverter/defibrillator) present    Chronic bronchitis ([facility]  "get it most q yr" (12/23/2013)   Chronic systolic (congestive) heart failure ([facility]  Fracture of left humerus    a. 07/2013.   GERD (gastroesophageal reflux disease)    not at present time   Heart murmur    "born w/it"    History of renal calculi    Hypertension    Kidney stones    NICM (nonischemic cardiomyopathy) ([facility]  a. 07/2013 Echo: EF  20-25%, diff HK, Gr2 DD, mild MR, mod dil LA/RA. 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    Shockable heart rhythm detected by automated external defibrillator    Syncope    Type II diabetes mellitus ([place]) 2006    Past Surgical History:  Procedure Laterality Date   CARDIAC CATHETERIZATION  08/10/13   CHOLECYSTECTOMY  ~ 2010   IMPLANTABLE CARDIOVERTER DEFIBRILLATOR IMPLANT   12/23/2013   STJ Fortify ICD implanted by [name] for cardiomyopathy and syncope   IMPLANTABLE CARDIOVERTER DEFIBRILLATOR IMPLANT N/A 12/23/2013   Procedure: IMPLANTABLE CARDIOVERTER DEFIBRILLATOR IMPLANT;  Surgeon: Coralyn Mark, MD;  Location[facility] LAB;  Service: Cardiovascular;  Laterality: N/A;   INGUINAL HERNIA REPAIR Left 03/01/2018   Procedure: OPEN REPAIR OF LEFT INGUINAL HERNIA WITH MESH;  Surgeon: Greer Pickerel, MD;  Location: [facility] ORS;  Service: General;  Laterality: Left;   LEAD REVISION/REPAIR N/A 05/15/2022   Procedure: LEAD REVISION/REPAIR;  Surgeon: Vickie Epley, MD;  Location: [place] CV LAB;  Service: Cardiovascular;  Laterality: N/A;   LEFT HEART CATHETERIZATION WITH CORONARY ANGIOGRAM N/A 08/10/2013   Procedure: LEFT HEART CATHETERIZATION WITH CORONARY ANGIOGRAM;  Surgeon: Jettie Booze, MD;  Location[facility] CATH LAB;  Service: Cardiovascular;  Laterality: N/A;   ORIF HUMERUS FRACTURE Left 08/12/2013   Procedure: OPEN REDUCTION INTERNAL FIXATION (ORIF) HUMERAL SHAFT FRACTURE;  Surgeon: Newt Minion, MD;  Location: [place];  Service: Orthopedics;  Laterality: Left;  Open Reduction Internal Fixation Left Humerus   RIGHT/LEFT HEART CATH AND CORONARY ANGIOGRAPHY N/A 05/12/2022   Procedure: RIGHT/LEFT HEART CATH AND CORONARY ANGIOGRAPHY;  Surgeon: Hebert Soho, DO;  Location: [place] CV LAB;  Service: Cardiovascular;  Laterality: N/A;   URETEROSCOPY     "laser for kidney stones"    Current Outpatient Medications  Medication Sig Dispense Refill   Accu-Chek Softclix Lancets lancets Use as instructed 100 each 12   albuterol (VENTOLIN HFA) 108 (90 Base) MCG/ACT inhaler Inhale into the lungs every 6 (six) hours as needed for wheezing or shortness of breath.     amiodarone (PACERONE) 400 MG tablet Take 1 tablet (400 mg total) by mouth daily. 30 tablet 2   aspirin 81 MG tablet Take 81 mg by mouth daily.     atorvastatin (LIPITOR) 10 MG tablet Take 1 tablet (10 mg total)  by mouth daily. 90 tablet 1   Blood Glucose Monitoring Suppl (ACCU-CHEK GUIDE) w/Device KIT UAD 1 kit 0   cetirizine (ZYRTEC) 10 MG tablet Take 1 tablet (10 mg total) by mouth daily. 90 tablet 1   chlorpheniramine-HYDROcodone (TUSSIONEX) 10-8 MG/5ML Take 5 mLs by mouth every 12 (twelve) hours as needed for cough. 115 mL 0   cholecalciferol (VITAMIN D3) 25 MCG (1000 UNIT) tablet Take 1,000 Units by mouth daily.     clobetasol ointment (TEMOVATE) 6.38 % Apply 1 Application topically 2 (two) times daily. 30 g 3   Continuous Blood Gluc Sensor (FREESTYLE LIBRE 2 SENSOR) MISC Use to check blood sugar TID. Change sensor once every 14 days. E11.65 2 each 3   dapagliflozin propanediol (FARXIGA) 10 MG TABS tablet Take 1 tablet (10 mg total) by mouth daily before breakfast. 90 tablet 3   EPINEPHrine 0.3 mg/0.3 mL IJ SOAJ injection Inject 0.3 mg into the muscle as needed. 1 each 1   famotidine (PEPCID) 20 MG tablet Take 1 tablet by mouth 1-2 times a day as needed.  90 tablet 1   FLUoxetine (PROZAC) 10 MG capsule Take 1 capsule (10 mg total) by mouth daily. 60 capsule 3   fluticasone (FLONASE) 50 MCG/ACT nasal spray Use 1-2 sprays in each nostril once a day as needed for nasal congestion. 16 g 5   furosemide (LASIX) 80 MG tablet Take 1 tablet (80 mg total) by mouth daily as needed for edema or fluid. 180 tablet 1   gabapentin (NEURONTIN) 300 MG capsule TAKE 1 CAPSULE BY MOUTH EVERY DAY AT BEDTIME 90 capsule 1   glucose blood (ACCU-CHEK GUIDE) test strip Use as instructed 100 each 12   insulin aspart (NOVOLOG FLEXPEN) 100 UNIT/ML FlexPen Inject 10 Units into the skin 2 (two) times daily before a meal. 15 mL 2   insulin glargine (LANTUS SOLOSTAR) 100 UNIT/ML Solostar Pen Inject 30 Units into the skin daily. 15 mL 1   Insulin Pen Needle (PEN NEEDLES) 31G X 8 MM MISC UAD 100 each 6   ipratropium-albuterol (DUONEB) 0.5-2.5 (3) MG/3ML SOLN Take 3 mLs by nebulization every 6 (six) hours as needed. 360 mL 2   Lancet  Devices (ACCU-CHEK SOFTCLIX) lancets 1 each by Other route 3 (three) times daily. 1 each 0   LORazepam (ATIVAN) 0.5 MG tablet Take 1 tablet (0.5 mg total) by mouth every 6 (six) hours as needed for anxiety. 10 tablet 0   LORazepam (ATIVAN) 0.5 MG tablet Take 1 tablet (0.5 mg total) by mouth 2 (two) times daily as needed for anxiety. 25 tablet 0   methocarbamol (ROBAXIN) 500 MG tablet TAKE 1 TABLET BY MOUTH ONCE DAILY AS NEEDED FOR MUSCLE SPASM 30 tablet 0   metoprolol succinate (TOPROL-XL) 50 MG 24 hr tablet Take 1 tablet (50 mg total) by mouth daily. 90 tablet 3   mometasone (ELOCON) 0.1 % cream Apply 1 application topically daily. 45 g 1   potassium chloride SA (KLOR-CON M) 20 MEQ tablet Take 1 tablet (20 mEq total) by mouth 2 (two) times daily. 60 tablet 5   sacubitril-valsartan (ENTRESTO) 24-26 MG Take 1 tablet by mouth 2 (two) times daily. 180 tablet 1   sildenafil (VIAGRA) 100 MG tablet Take 1-2 tabs PO 1/2-1 hr prior to intercourse. 20 tablet 4   spironolactone (ALDACTONE) 25 MG tablet Take 0.5 tablets (12.5 mg total) by mouth daily. 30 tablet 5   tamsulosin (FLOMAX) 0.4 MG CAPS capsule Take 2 capsules (0.8 mg total) by mouth daily. 180 capsule 1   triamcinolone cream (KENALOG) 0.1 % Apply sparingly to red itchy areas twice daily as needed. Avoid face, neck, armpits or groin area. Do not use more than 3 weeks in a row. 45 g 5   No current facility-administered medications for this visit.    Allergies:   Bee venom and Trulicity [dulaglutide]   Social History:  The patient  reports that he quit smoking about 8 years ago. His smoking use included cigarettes. He has a 28.00 pack-year smoking history. He has never used smokeless tobacco. He reports current alcohol use. He reports current drug use. Drug: Marijuana.   Family History:  The patient's family history includes Arthritis in his father and mother; Diabetes in his father and mother; Hypertension in his brother, father, and  mother.  ROS:  Review of systems complete and found to be negative unless listed in HPI.    PHYSICAL EXAM:  VS:  There were no vitals taken for this visit. BMI: There is no height or weight on file to calculate BMI. General: Pleasant,  NAD. No resp difficulty Psych: Normal affect. HEENT:  Normal, without mass or lesion.         Neck: Supple, no bruits or JVD. Carotids 2+. No lymphadenopathy/thyromegaly appreciated. Heart: PMI nondisplaced. RRR no s3, s4, or murmurs. Lungs:  Resp regular and unlabored, CTA. Abdomen: Soft, non-tender, non-distended, No HSM, BS + x 4.   Extremities: No clubbing, cyanosis or edema. DP/PT/Radials 2+ and equal bilaterally. Neuro: Alert and oriented X 3. Moves all extremities spontaneously.   ICD site: stable.   EKG: Not done today  Device interrogation *** done today and reviewed by myself:  Battery and lead testing done are good No VT or arrhythmias  05/12/22: R/LHC HEMODYNAMICS: RA:                  1 mmHg (mean) RV:                  22/1-3 mmHg PA:                  23/6 mmHg (12 mean) PCWP:            4 mmHg (mean)                                      Estimated Fick CO/CI   7 L/min, 2.9 L/min/m2                                                 TPG                 8  mmHg                                              PVR                 ~1 Wood Units  PAPi                >5       IMPRESSION: Low pre and post capillary filling pressures.  Normal cardiac output/cardiac index.  Normal PVR & PA mean No obstructive CAD, left dominant w/ large Lcx supplying multiple large marginals.    05/06/22: TTE 1. Left ventricular ejection fraction, by estimation, is 30 to 35%. The  left ventricle has moderately decreased function. The left ventricle  demonstrates global hypokinesis. The left ventricular internal cavity size  was moderately dilated. There is mild   eccentric left ventricular hypertrophy. Left ventricular diastolic  parameters are consistent  with Grade I diastolic dysfunction (impaired  relaxation).   2. Right ventricular systolic function is normal. The right ventricular  size is normal. Tricuspid regurgitation signal is inadequate for assessing  PA pressure.   3. Left atrial size was moderately dilated.   4. The mitral valve is normal in structure. No evidence of mitral valve  regurgitation.   5. The aortic valve is tricuspid. Aortic valve regurgitation is not  visualized. No aortic stenosis is present.   6. Aortic dilatation noted. There is borderline dilatation of the aortic  root, measuring 38 mm.   7. The inferior vena cava is normal in size with greater  than 50%  respiratory variability, suggesting right atrial pressure of 3 mmHg.   Comparison(s): No significant change from prior study. Prior images  reviewed side by side.    Recent Labs: 03/21/2022: Pro B Natriuretic peptide (BNP) 18.0 05/17/2022: Magnesium 2.1 05/23/2022: ALT 14; TSH 1.150 05/26/2022: B Natriuretic Peptide 18.9 05/28/2022: BUN 25; Creatinine, Ser 2.22; Hemoglobin 12.4; Platelets 251; Potassium 5.7; Sodium 137  08/26/2021: Chol/HDL Ratio 4.2; Cholesterol, Total 178; HDL 42; LDL Chol Calc (NIH) 81; Triglycerides 336   Estimated Creatinine Clearance: 48.2 mL/min (A) (by C-G formula based on SCr of 2.22 mg/dL (H)).   Wt Readings from Last 3 Encounters:  05/28/22 254 lb 13.6 oz (115.6 kg)  05/26/22 254 lb 12.8 oz (115.6 kg)  05/23/22 254 lb (115.2 kg)     Other studies reviewed: Additional studies/records reviewed today include: summarized above  ASSESSMENT AND PLAN:  H/o VT/VF with upgrade to DDD ICD Wound is well healed, no signs of infection Very anxious about his device in general. Has been referred to Dr. Michail Sermon.  A lead is on auto threshold and OK, RV lead is chronic and checked on the 8th and OK Continue amiodarone. Labs ok 05/23/22  NICM Chronic CHF (systolic) No CorVue data yet Volume status stable on exma Following with HF  team.   He is not driving  5. Anxiety We discussed that his anxiety/fear is normal and management strategies, he would love to try and avoid another mediation, we have put an urgent referral in for Dr. Michail Sermon, discussed tools for him to be able to check in on his pulse,/HR to give him assurance in his HR at least, he has his APP on his phone to check in his device transmissions.  Disposition: ***   Current medicines are reviewed at length with the patient today.  The patient did not have any concerns regarding medicines.  Signed, Lollie Marrow, PA-C  06/10/2022 2:05 PM   [address] [place] [address] [phone] (office)  [phone] (fax)

## 2022-06-11 ENCOUNTER — Encounter: Payer: Self-pay | Admitting: Pulmonary Disease

## 2022-06-11 ENCOUNTER — Telehealth (HOSPITAL_COMMUNITY): Payer: Self-pay

## 2022-06-11 NOTE — Telephone Encounter (Signed)
Patient called to report that he has been experiencing increased shortness of breath about 10 times a day for about 2 months(since lead revision). He denies chest pain, dizziness, nausea/vomiting, swelling, increased weight gain. Please advise

## 2022-06-13 ENCOUNTER — Telehealth: Payer: Self-pay | Admitting: Cardiology

## 2022-06-13 NOTE — Telephone Encounter (Signed)
Spoke with pt, he has been having some SOB but this morning it seems to be a little worse. He is trying to sleep and he says it is bothering him. He denies swelling or weight gain. He recently had another lead plced for his device and is concerned this maybe related. He has sent a transmission this morning so will check with the device folks to see if there are any concerns with that and call him back. Pt agreed with this plan.

## 2022-06-13 NOTE — Telephone Encounter (Signed)
  Pt c/o Shortness Of Breath: STAT if SOB developed within the last 24 hours or pt is noticeably SOB on the phone  1. Are you currently SOB (can you hear that pt is SOB on the phone)? No   2. How long have you been experiencing SOB?   3. Are you SOB when sitting or when up moving around? Laying down  4. Are you currently experiencing any other symptoms? Pt said, his SOB its getting often now, today while his laying he felt catching his breath 6x in an hour.

## 2022-06-13 NOTE — Telephone Encounter (Signed)
Transmission reviewed, lead parameters stable no episodes, only thing of note device suggested patient has some fluid overload late December early Jan, although reference suggests that fluid level is improving.  Patient has F/U with Oda Kilts 06/18/2022.

## 2022-06-13 NOTE — Telephone Encounter (Signed)
Spoke with pt, aware transmission was okay. He reports the device folks called him and told him to take a furosemide. He will call with other concerns.

## 2022-06-13 NOTE — Telephone Encounter (Signed)
Spoke with patient, informed him that the lead parameters were stable, but the device did indicate that he had been retaining fluid, asked patient if he had taken any Lasix that was prescribed, patient stated he hadn't taken any Lasix since he left the hospital, Lasix is ordered PRN, asked patient if he was weighing himself daily patient stated he wasn't really, advised patient to take  lasix as prescribed to see if that would help with SOB

## 2022-06-14 ENCOUNTER — Observation Stay (HOSPITAL_COMMUNITY)
Admission: EM | Admit: 2022-06-14 | Discharge: 2022-06-16 | Disposition: A | Discharge status: Home / Self Care | Payer: Medicaid Other | Attending: Internal Medicine | Admitting: Internal Medicine

## 2022-06-14 ENCOUNTER — Emergency Department (HOSPITAL_COMMUNITY): Payer: Medicaid Other

## 2022-06-14 ENCOUNTER — Other Ambulatory Visit: Payer: Self-pay

## 2022-06-14 ENCOUNTER — Telehealth: Payer: Self-pay | Admitting: Physician Assistant

## 2022-06-14 ENCOUNTER — Encounter (HOSPITAL_COMMUNITY): Payer: Self-pay | Admitting: Pharmacy Technician

## 2022-06-14 DIAGNOSIS — E1122 Type 2 diabetes mellitus with diabetic chronic kidney disease: Secondary | ICD-10-CM | POA: Diagnosis not present

## 2022-06-14 DIAGNOSIS — I130 Hypertensive heart and chronic kidney disease with heart failure and stage 1 through stage 4 chronic kidney disease, or unspecified chronic kidney disease: Secondary | ICD-10-CM | POA: Insufficient documentation

## 2022-06-14 DIAGNOSIS — Z794 Long term (current) use of insulin: Secondary | ICD-10-CM | POA: Insufficient documentation

## 2022-06-14 DIAGNOSIS — Z7982 Long term (current) use of aspirin: Secondary | ICD-10-CM | POA: Diagnosis not present

## 2022-06-14 DIAGNOSIS — N1832 Chronic kidney disease, stage 3b: Secondary | ICD-10-CM | POA: Diagnosis not present

## 2022-06-14 DIAGNOSIS — Z79899 Other long term (current) drug therapy: Secondary | ICD-10-CM | POA: Insufficient documentation

## 2022-06-14 DIAGNOSIS — Z95810 Presence of automatic (implantable) cardiac defibrillator: Secondary | ICD-10-CM | POA: Diagnosis present

## 2022-06-14 DIAGNOSIS — I5022 Chronic systolic (congestive) heart failure: Secondary | ICD-10-CM | POA: Insufficient documentation

## 2022-06-14 DIAGNOSIS — E1159 Type 2 diabetes mellitus with other circulatory complications: Secondary | ICD-10-CM

## 2022-06-14 DIAGNOSIS — I428 Other cardiomyopathies: Secondary | ICD-10-CM

## 2022-06-14 DIAGNOSIS — N179 Acute kidney failure, unspecified: Principal | ICD-10-CM | POA: Diagnosis present

## 2022-06-14 DIAGNOSIS — E119 Type 2 diabetes mellitus without complications: Secondary | ICD-10-CM

## 2022-06-14 DIAGNOSIS — E785 Hyperlipidemia, unspecified: Secondary | ICD-10-CM

## 2022-06-14 DIAGNOSIS — E1169 Type 2 diabetes mellitus with other specified complication: Secondary | ICD-10-CM

## 2022-06-14 DIAGNOSIS — R079 Chest pain, unspecified: Secondary | ICD-10-CM | POA: Diagnosis present

## 2022-06-14 DIAGNOSIS — R0789 Other chest pain: Secondary | ICD-10-CM | POA: Diagnosis not present

## 2022-06-14 DIAGNOSIS — Z87891 Personal history of nicotine dependence: Secondary | ICD-10-CM | POA: Insufficient documentation

## 2022-06-14 DIAGNOSIS — R0602 Shortness of breath: Secondary | ICD-10-CM | POA: Diagnosis present

## 2022-06-14 DIAGNOSIS — Z8679 Personal history of other diseases of the circulatory system: Secondary | ICD-10-CM

## 2022-06-14 DIAGNOSIS — F411 Generalized anxiety disorder: Secondary | ICD-10-CM | POA: Diagnosis present

## 2022-06-14 DIAGNOSIS — I10 Essential (primary) hypertension: Secondary | ICD-10-CM | POA: Insufficient documentation

## 2022-06-14 DIAGNOSIS — R0600 Dyspnea, unspecified: Secondary | ICD-10-CM | POA: Diagnosis present

## 2022-06-14 LAB — CBC WITH DIFFERENTIAL/PLATELET
Abs Immature Granulocytes: 0.03 10*3/uL (ref 0.00–0.07)
Basophils Absolute: 0.1 10*3/uL (ref 0.0–0.1)
Basophils Relative: 1 %
Eosinophils Absolute: 0.1 10*3/uL (ref 0.0–0.5)
Eosinophils Relative: 1 %
HCT: 41 % (ref 39.0–52.0)
Hemoglobin: 13.6 g/dL (ref 13.0–17.0)
Immature Granulocytes: 0 %
Lymphocytes Relative: 32 %
Lymphs Abs: 2.6 10*3/uL (ref 0.7–4.0)
MCH: 29.9 pg (ref 26.0–34.0)
MCHC: 33.2 g/dL (ref 30.0–36.0)
MCV: 90.1 fL (ref 80.0–100.0)
Monocytes Absolute: 0.5 10*3/uL (ref 0.1–1.0)
Monocytes Relative: 6 %
Neutro Abs: 5 10*3/uL (ref 1.7–7.7)
Neutrophils Relative %: 60 %
Platelets: 335 10*3/uL (ref 150–400)
RBC: 4.55 MIL/uL (ref 4.22–5.81)
RDW: 14.3 % (ref 11.5–15.5)
WBC: 8.3 10*3/uL (ref 4.0–10.5)
nRBC: 0 % (ref 0.0–0.2)

## 2022-06-14 LAB — BASIC METABOLIC PANEL
Anion gap: 15 (ref 5–15)
BUN: 19 mg/dL (ref 6–20)
CO2: 21 mmol/L — ABNORMAL LOW (ref 22–32)
Calcium: 9 mg/dL (ref 8.9–10.3)
Chloride: 100 mmol/L (ref 98–111)
Creatinine, Ser: 2.65 mg/dL — ABNORMAL HIGH (ref 0.61–1.24)
GFR, Estimated: 27 mL/min — ABNORMAL LOW (ref >60)
Glucose, Bld: 310 mg/dL — ABNORMAL HIGH (ref 70–99)
Potassium: 4.4 mmol/L (ref 3.5–5.1)
Sodium: 136 mmol/L (ref 135–145)

## 2022-06-14 LAB — CBG MONITORING, ED: Glucose-Capillary: 156 mg/dL — ABNORMAL HIGH (ref 70–99)

## 2022-06-14 LAB — TROPONIN I (HIGH SENSITIVITY): Troponin I (High Sensitivity): 6 ng/L (ref <18)

## 2022-06-14 MED ORDER — GABAPENTIN 300 MG PO CAPS
300.0000 mg | ORAL_CAPSULE | Freq: Every day | ORAL | Status: DC
  Administered 2022-06-14 – 2022-06-15 (×2): 300 mg via ORAL
  Filled 2022-06-14 (×2): qty 1

## 2022-06-14 MED ORDER — LACTATED RINGERS IV SOLN: INTRAVENOUS | Status: DC

## 2022-06-14 MED ORDER — FLUOXETINE HCL 10 MG PO CAPS
10.0000 mg | ORAL_CAPSULE | Freq: Every day | ORAL | Status: DC
  Administered 2022-06-15 – 2022-06-16 (×2): 10 mg via ORAL
  Filled 2022-06-14 (×3): qty 1

## 2022-06-14 MED ORDER — INSULIN ASPART 100 UNIT/ML IJ SOLN
0.0000 [IU] | Freq: Three times a day (TID) | INTRAMUSCULAR | Status: DC
  Administered 2022-06-15 – 2022-06-16 (×4): 3 [IU] via SUBCUTANEOUS

## 2022-06-14 MED ORDER — LORAZEPAM 0.5 MG PO TABS
0.5000 mg | ORAL_TABLET | Freq: Two times a day (BID) | ORAL | Status: DC | PRN
  Administered 2022-06-14 – 2022-06-16 (×3): 0.5 mg via ORAL
  Filled 2022-06-14 (×3): qty 1

## 2022-06-14 MED ORDER — INSULIN GLARGINE-YFGN 100 UNIT/ML ~~LOC~~ SOLN
30.0000 [IU] | Freq: Every day | SUBCUTANEOUS | Status: DC
  Administered 2022-06-15: 30 [IU] via SUBCUTANEOUS
  Filled 2022-06-14 (×3): qty 0.3

## 2022-06-14 MED ORDER — AMIODARONE HCL 200 MG PO TABS: 400.0000 mg | ORAL_TABLET | Freq: Every day | ORAL | Status: DC

## 2022-06-14 MED ORDER — TAMSULOSIN HCL 0.4 MG PO CAPS
0.8000 mg | ORAL_CAPSULE | Freq: Every day | ORAL | Status: DC
  Administered 2022-06-15 – 2022-06-16 (×2): 0.8 mg via ORAL
  Filled 2022-06-14 (×2): qty 2

## 2022-06-14 MED ORDER — ENOXAPARIN SODIUM 40 MG/0.4ML IJ SOSY
40.0000 mg | PREFILLED_SYRINGE | INTRAMUSCULAR | Status: DC
  Administered 2022-06-15 – 2022-06-16 (×2): 40 mg via SUBCUTANEOUS
  Filled 2022-06-14 (×2): qty 0.4

## 2022-06-14 MED ORDER — ACETAMINOPHEN 325 MG PO TABS: 650.0000 mg | ORAL_TABLET | Freq: Four times a day (QID) | ORAL | Status: DC | PRN

## 2022-06-14 MED ORDER — INSULIN ASPART 100 UNIT/ML IJ SOLN: 10.0000 [IU] | Freq: Two times a day (BID) | INTRAMUSCULAR | Status: DC

## 2022-06-14 MED ORDER — SODIUM CHLORIDE 0.9 % IV SOLN: INTRAVENOUS | Status: AC

## 2022-06-14 MED ORDER — ACETAMINOPHEN 650 MG RE SUPP: 650.0000 mg | Freq: Four times a day (QID) | RECTAL | Status: DC | PRN

## 2022-06-14 MED ORDER — METOPROLOL SUCCINATE ER 50 MG PO TB24
50.0000 mg | ORAL_TABLET | Freq: Every day | ORAL | Status: DC
  Administered 2022-06-15 – 2022-06-16 (×2): 50 mg via ORAL
  Filled 2022-06-14: qty 2
  Filled 2022-06-14: qty 1

## 2022-06-14 MED ORDER — INSULIN ASPART 100 UNIT/ML IJ SOLN: 0.0000 [IU] | Freq: Every day | INTRAMUSCULAR | Status: DC

## 2022-06-14 MED ORDER — ATORVASTATIN CALCIUM 10 MG PO TABS
10.0000 mg | ORAL_TABLET | Freq: Every day | ORAL | Status: DC
  Administered 2022-06-15 – 2022-06-16 (×2): 10 mg via ORAL
  Filled 2022-06-14 (×2): qty 1

## 2022-06-14 MED ORDER — ASPIRIN 81 MG PO CHEW
81.0000 mg | CHEWABLE_TABLET | Freq: Every day | ORAL | Status: DC
  Administered 2022-06-15 – 2022-06-16 (×2): 81 mg via ORAL
  Filled 2022-06-14 (×2): qty 1

## 2022-06-14 NOTE — ED Provider Triage Note (Signed)
Emergency Medicine Provider Triage Evaluation Note  [name] , a 57 y.o. male  was evaluated in triage.  Pt complains of chest pain and shortness of breath since yesterday.  Feels as if heart is fluttering and feels gasps for breath every 10-30 min.  Has not noticed the ICD go off.  Denies fevers.  Review of Systems  Positive:  Negative: See above  Physical Exam  BP 112/72 (BP Location: Right Arm)   Pulse 67   Temp 97.7 F (36.5 C)   Resp 19   SpO2 99%  Gen:   Awake, no distress   Resp:  Normal effort  MSK:   Moves extremities without difficulty  Other:  Chest with mild TTP.  No crepitus.  Pulses 2+ radial.  Medical Decision Making  Medically screening exam initiated at 6:17 PM.  Appropriate orders placed.  DELOSS AMICO was informed that the remainder of the evaluation will be completed by another provider, this initial triage assessment does not replace that evaluation, and the importance of remaining in the ED until their evaluation is complete.  Information from ICD gathered using [place] ICD device reader.  [facility] DEVICE CHECK 05/23/22 note information referenced below: ICD   Date Time Interrogation Session  [phone] [phone]  Pulse Generator Manufacturer  [place] DR 1357-40Q Fortify ... 9357-01X Fortify ...  Pulse Gen Serial Number  [id number]  Clinic Name  [facility]  Implantable Pulse Generator Type  Implantable Cardi... Implantable Cardi... Implantable Cardi...  Implantable Cardiac Defibulator  Implantable Pulse Generator Implant Date  [id number]  Implantable Lead Manufacturer  OTHER [facility]  Implantable Lead Model  OTL5726/20 ULTIPACE 7122Q [place]  Implantable Lead Serial Number  [id number]  Implantable Lead Implant Date  [id number]  Implantable  Lead Location Detail 1  UNKNOWN UNKNOWN UNKNOWN  Implantable Lead Location  [id number] [phone]  Implantable Lead Connection Status  [id number]   Implantable Lead Manufacturer  [facility]    Implantable Lead Model  7122Q [place] SJ4    Implantable Lead Serial Number  [id number]    Implantable Lead Implant Date  [id number]    Implantable Lead Location Detail 1  UNKNOWN    Implantable Lead Location  [id number]    Resulting Agency  [address]     Prince Rome, PA-C 71/21/97 1827

## 2022-06-14 NOTE — ED Notes (Signed)
[employer] AICD interrogated at this time.

## 2022-06-14 NOTE — Telephone Encounter (Signed)
NICM/HFrEF s/p ICD, CKD, IDDM - recent admission with VT storm after 22 ICD shocks resulting in increased anxiety/panic disorder in the setting of acute illness and hypokalemia.  Pt called stating he was short of breath. He took 80 mg lasix x 2 doses yesterday and one dose this morning. He denies lower extremity swelling. He was able to sleep last night. However, this morning before his AM medications he felt dizzy. His BG was over 200 before insulin. He is no longer dizzy. His only complaint is shortness of breath. He states he is not urinating more with the extra lasix yet. He has not been weighing and does not have a BP cuff.   Given his CHF, hx of VT storm, CKD, and IDDM, etiology of dizziness and palpitations is not clear. Thankfully these have resolved. He does continue to report intermittent SOB, but no orthopnea or lower extremity edema. He does not sound like he is respiratory distress on the phone. I advised to take the second dose of 80 mg lasix. I confirmed he was not taking a potassium supplement. I advised he would best be evaluated in the [facility]. I asked him to take the lasix and see if there was improvement. If no improvement after second lasix, he will come to the ER.

## 2022-06-14 NOTE — ED Triage Notes (Signed)
Pt here with reports of feeling his heart fluttering and feeling shob onset yesterday.

## 2022-06-14 NOTE — H&P (Signed)
History and Physical    [name]:[id number] DOB: 04/06/1966 DOA: 06/14/2022  PCP: [name], MD  Patient coming from: Home  Chief Complaint: Shortness of breath  HPI: [name] is a 57 y.o. male with medical history significant of chronic HFrEF (EF 30 to 35%), ischemic cardiomyopathy, status post ICD, hypertension, type 2 diabetes, GERD.  Recently admitted 11/25-12/9 for V. tach arrest and VT storm in the setting of hypokalemia and underwent upgrade of atrial lead placement.  LHC showing nonobstructive CAD.  He was placed on amiodarone and metoprolol.  Also treated for cellulitis of buttocks, gout, diarrhea, AKI, and hyponatremia.  Patient presents to the ED today complaining of shortness of breath, chest pain, and palpitations.  Vital signs stable.  Labs showing no leukocytosis or anemia, potassium 4.4, bicarb 21, anion gap 15, glucose 310, creatinine 2.6 (baseline 1.7-2.0), high-sensitivity troponin negative, BNP pending.  Lungs clear on chest x-ray. TR[facility] called to admit.  Patient states he has been feeling very anxious and having frequent panic attacks after his hospitalization last month and has been started on Ativan and Prozac to help with his anxiety.  He has continued to have shortness of breath but much worse for the past 3 days.  He feels short of breath even at rest and feeling his heart fluttering.  He is also having intermittent left-sided chest pain for the past 3 days which can feel pressure-like or even sharp.  Denies any chest pain at present.  States he was shocked by his ICD 22 times during his hospitalization last month but no further episodes since then.  States he was previously taking Lasix 80 mg twice daily but it was stopped after his hospitalization last month.  Due to him having increasing shortness of breath for the past few days, his cardiologist advised him to start taking Lasix 80 mg twice daily again and he started taking it yesterday.  He reports  compliance with all of his other cardiac medications.  Patient has no other complaints.  Denies fevers, nausea, vomiting, abdominal pain, or diarrhea.  Review of Systems:  Review of Systems  All other systems reviewed and are negative.   Past Medical History:  Diagnosis Date   AICD (automatic cardioverter/defibrillator) present    Chronic bronchitis ([place])    "get it most q yr" (12/23/2013)   Chronic systolic (congestive) heart failure ([place])    Fracture of left humerus    a. 07/2013.   GERD (gastroesophageal reflux disease)    not at present time   Heart murmur    "born w/it"    History of renal calculi    Hypertension    Kidney stones    NICM (nonischemic cardiomyopathy) ([place])    a. 07/2013 Echo: EF 20-25%, diff HK, Gr2 DD, mild MR, mod dil LA/RA. 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    Shockable heart rhythm detected by automated external defibrillator    Syncope    Type II diabetes mellitus ([place]) 2006    Past Surgical History:  Procedure Laterality Date   CARDIAC CATHETERIZATION  08/10/13   CHOLECYSTECTOMY  ~ 2010   IMPLANTABLE CARDIOVERTER DEFIBRILLATOR IMPLANT  12/23/2013   STJ Fortify ICD implanted by [name] for cardiomyopathy and syncope   IMPLANTABLE CARDIOVERTER DEFIBRILLATOR IMPLANT N/A 12/23/2013   Procedure: IMPLANTABLE CARDIOVERTER DEFIBRILLATOR IMPLANT;  Surgeon: Coralyn Mark, MD;  Location: [facility] CATH LAB;  Service:  Cardiovascular;  Laterality: N/A;   INGUINAL HERNIA REPAIR Left 03/01/2018   Procedure: OPEN REPAIR OF LEFT INGUINAL HERNIA WITH MESH;  Surgeon: Greer Pickerel, MD;  Location: [facility] ORS;  Service: General;  Laterality: Left;   LEAD REVISION/REPAIR N/A 05/15/2022   Procedure: LEAD REVISION/REPAIR;  Surgeon: Vickie Epley, MD;  Location: [facility] CV LAB;  Service: Cardiovascular;  Laterality: N/A;   LEFT HEART CATHETERIZATION WITH CORONARY ANGIOGRAM N/A 08/10/2013   Procedure:  LEFT HEART CATHETERIZATION WITH CORONARY ANGIOGRAM;  Surgeon: Jettie Booze, MD;  Location: [facility] CATH LAB;  Service: Cardiovascular;  Laterality: N/A;   ORIF HUMERUS FRACTURE Left 08/12/2013   Procedure: OPEN REDUCTION INTERNAL FIXATION (ORIF) HUMERAL SHAFT FRACTURE;  Surgeon: Newt Minion, MD;  Location: [facility];  Service: Orthopedics;  Laterality: Left;  Open Reduction Internal Fixation Left Humerus   RIGHT/LEFT HEART CATH AND CORONARY ANGIOGRAPHY N/A 05/12/2022   Procedure: RIGHT/LEFT HEART CATH AND CORONARY ANGIOGRAPHY;  Surgeon: Hebert Soho, DO;  Location: [facility] CV LAB;  Service: Cardiovascular;  Laterality: N/A;   URETEROSCOPY     "laser for kidney stones"     reports that he quit smoking about 8 years ago. His smoking use included cigarettes. He has a 28.00 pack-year smoking history. He has never used smokeless tobacco. He reports current alcohol use. He reports current drug use. Drug: Marijuana.  Allergies  Allergen Reactions   Bee Venom Anaphylaxis   Trulicity [Dulaglutide] Diarrhea    Patient experience onset of diarrhea with using this medication - cleared after med stopped.    Family History  Problem Relation Age of Onset   Diabetes Father    Arthritis Father    Hypertension Father    Diabetes Mother    Arthritis Mother    Hypertension Mother    Hypertension Brother     Prior to Admission medications   Medication Sig Start Date End Date Taking? Authorizing Provider  albuterol (VENTOLIN HFA) 108 (90 Base) MCG/ACT inhaler Inhale into the lungs every 6 (six) hours as needed for wheezing or shortness of breath.   Yes [provider]  amiodarone (PACERONE) 400 MG tablet Take 1 tablet (400 mg total) by mouth daily. 05/17/22  Yes Samuella Cota, MD  aspirin 81 MG tablet Take 81 mg by mouth daily.   Yes [provider]  atorvastatin (LIPITOR) 10 MG tablet Take 1 tablet (10 mg total) by mouth daily. 01/19/22  Yes Elsie Stain, MD  cetirizine  (ZYRTEC) 10 MG tablet Take 1 tablet (10 mg total) by mouth daily. 01/30/22  Yes Valentina Shaggy, MD  cholecalciferol (VITAMIN D3) 25 MCG (1000 UNIT) tablet Take 1,000 Units by mouth daily.   Yes [provider]  clobetasol ointment (TEMOVATE) 8.31 % Apply 1 Application topically 2 (two) times daily. Patient taking differently: Apply 1 Application topically daily as needed (for eczema). 01/30/22  Yes Valentina Shaggy, MD  dapagliflozin propanediol (FARXIGA) 10 MG TABS tablet Take 1 tablet (10 mg total) by mouth daily before breakfast. 05/26/22  Yes [name], Aditya, DO  EPINEPHrine 0.3 mg/0.3 mL IJ SOAJ injection Inject 0.3 mg into the muscle as needed. 01/30/22  Yes Valentina Shaggy, MD  famotidine (PEPCID) 20 MG tablet Take 1 tablet by mouth 1-2 times a day as needed. Patient taking differently: Take 20 mg by mouth 2 (two) times daily as needed for heartburn or indigestion. 02/18/22  Yes Valentina Shaggy, MD  FLUoxetine (PROZAC) 10 MG capsule Take 1 capsule (10 mg  total) by mouth daily. 05/26/22  Yes [name], Aditya, DO  fluticasone (FLONASE) 50 MCG/ACT nasal spray Use 1-2 sprays in each nostril once a day as needed for nasal congestion. 01/30/22  Yes Valentina Shaggy, MD  furosemide (LASIX) 80 MG tablet Take 1 tablet (80 mg total) by mouth daily as needed for edema or fluid. 05/17/22  Yes Samuella Cota, MD  gabapentin (NEURONTIN) 300 MG capsule TAKE 1 CAPSULE BY MOUTH EVERY DAY AT BEDTIME Patient taking differently: Take 300 mg by mouth at bedtime. 10/05/20  Yes [name], MD  insulin aspart (NOVOLOG FLEXPEN) 100 UNIT/ML FlexPen Inject 10 Units into the skin 2 (two) times daily before a meal. 04/07/22  Yes [name], MD  insulin glargine (LANTUS SOLOSTAR) 100 UNIT/ML Solostar Pen Inject 30 Units into the skin daily. 03/21/22  Yes [name], MD  ipratropium-albuterol (DUONEB) 0.5-2.5 (3) MG/3ML SOLN Take 3 mLs by nebulization every 6  (six) hours as needed. 03/21/22  Yes Juanito Doom, MD  LORazepam (ATIVAN) 0.5 MG tablet Take 1 tablet (0.5 mg total) by mouth 2 (two) times daily as needed for anxiety. 05/30/22  Yes [name], MD  methocarbamol (ROBAXIN) 500 MG tablet TAKE 1 TABLET BY MOUTH ONCE DAILY AS NEEDED FOR MUSCLE SPASM Patient taking differently: Take 500 mg by mouth daily as needed for muscle spasms. 04/25/22  Yes Rodena Goldmann A, DO  metoprolol succinate (TOPROL-XL) 50 MG 24 hr tablet Take 1 tablet (50 mg total) by mouth daily. 05/26/22  Yes [name], Aditya, DO  sacubitril-valsartan (ENTRESTO) 24-26 MG Take 1 tablet by mouth 2 (two) times daily. 04/21/22  Yes Lelon Perla, MD  sildenafil (VIAGRA) 100 MG tablet Take 1-2 tabs PO 1/2-1 hr prior to intercourse. 12/14/20  Yes [name], MD  spironolactone (ALDACTONE) 25 MG tablet Take 0.5 tablets (12.5 mg total) by mouth daily. 05/26/22  Yes [name], Aditya, DO  tamsulosin (FLOMAX) 0.4 MG CAPS capsule Take 2 capsules (0.8 mg total) by mouth daily. 01/19/22  Yes Elsie Stain, MD  triamcinolone cream (KENALOG) 0.1 % Apply sparingly to red itchy areas twice daily as needed. Avoid face, neck, armpits or groin area. Do not use more than 3 weeks in a row. 01/30/22  Yes Valentina Shaggy, MD  Accu-Chek Softclix Lancets lancets Use as instructed 02/01/22   Cristie Hem, MD  Blood Glucose Monitoring Suppl (ACCU-CHEK GUIDE) w/Device KIT UAD 02/01/22   Cristie Hem, MD  Continuous Blood Gluc Sensor (FREESTYLE LIBRE 2 SENSOR) MISC Use to check blood sugar TID. Change sensor once every 14 days. E11.65 03/06/22   [name], MD  glucose blood (ACCU-CHEK GUIDE) test strip Use as instructed 02/01/22   Cristie Hem, MD  Insulin Pen Needle (PEN NEEDLES) 31G X 8 MM MISC UAD 03/01/22   [name], MD  Lancet Devices [facility]) lancets 1 each by Other route 3 (three) times daily. 10/20/14   Funches, Adriana Mccallum, MD   potassium chloride SA (KLOR-CON M) 20 MEQ tablet Take 1 tablet (20 mEq total) by mouth 2 (two) times daily. Patient not taking: Reported on 06/14/2022 05/09/22   Hebert Soho, DO    Physical Exam: Vitals:   06/14/22 1902 06/14/22 1930 06/14/22 2000 06/14/22 2030  BP:  (!) 122/90 99/83 127/87  Pulse:  69 63 79  Resp:  13 17 20   Temp:      TempSrc:      SpO2:  97% 96% 95%  Weight: 114.8 kg     Height: 6' (1.829 m)       Physical Exam Vitals reviewed.  Constitutional:      General: He is not in acute distress. HENT:     Head: Normocephalic and atraumatic.  Eyes:     Extraocular Movements: Extraocular movements intact.  Cardiovascular:     Rate and Rhythm: Normal rate and regular rhythm.     Pulses: Normal pulses.  Pulmonary:     Effort: Pulmonary effort is normal. No respiratory distress.     Breath sounds: Normal breath sounds. No wheezing or rales.  Abdominal:     General: Bowel sounds are normal. There is no distension.     Palpations: Abdomen is soft.     Tenderness: There is no abdominal tenderness.  Musculoskeletal:     Cervical back: Normal range of motion.     Right lower leg: No edema.     Left lower leg: No edema.  Skin:    General: Skin is warm and dry.  Neurological:     General: No focal deficit present.     Mental Status: He is alert and oriented to person, place, and time.     Labs on Admission: I have personally reviewed following labs and imaging studies  CBC: Recent Labs  Lab 06/14/22 1820  WBC 8.3  NEUTROABS 5.0  HGB 13.6  HCT 41.0  MCV 90.1  PLT 196   Basic Metabolic Panel: Recent Labs  Lab 06/14/22 1814  NA 136  K 4.4  CL 100  CO2 21*  GLUCOSE 310*  BUN 19  CREATININE 2.65*  CALCIUM 9.0   GFR: Estimated Creatinine Clearance: 40.2 mL/min (A) (by C-G formula based on SCr of 2.65 mg/dL (H)). Liver Function Tests: No results for input(s): "AST", "ALT", "ALKPHOS", "BILITOT", "PROT", "ALBUMIN" in the last 168 hours. No  results for input(s): "LIPASE", "AMYLASE" in the last 168 hours. No results for input(s): "AMMONIA" in the last 168 hours. Coagulation Profile: No results for input(s): "INR", "PROTIME" in the last 168 hours. Cardiac Enzymes: No results for input(s): "CKTOTAL", "CKMB", "CKMBINDEX", "TROPONINI" in the last 168 hours. BNP (last 3 results) Recent Labs    01/09/22 1002 03/21/22 1452  PROBNP 97 18.0   HbA1C: No results for input(s): "HGBA1C" in the last 72 hours. CBG: No results for input(s): "GLUCAP" in the last 168 hours. Lipid Profile: No results for input(s): "CHOL", "HDL", "LDLCALC", "TRIG", "CHOLHDL", "LDLDIRECT" in the last 72 hours. Thyroid Function Tests: No results for input(s): "TSH", "T4TOTAL", "FREET4", "T3FREE", "THYROIDAB" in the last 72 hours. Anemia Panel: No results for input(s): "VITAMINB12", "FOLATE", "FERRITIN", "TIBC", "IRON", "RETICCTPCT" in the last 72 hours. Urine analysis:    Component Value Date/Time   COLORURINE STRAW (A) 02/13/2022 1637   APPEARANCEUR CLEAR 02/13/2022 1637   APPEARANCEUR Clear 07/17/2020 1655   LABSPEC 1.015 02/13/2022 1637   PHURINE 5.0 02/13/2022 1637   GLUCOSEU >=500 (A) 02/13/2022 1637   HGBUR NEGATIVE 02/13/2022 1637   BILIRUBINUR NEGATIVE 02/13/2022 1637   BILIRUBINUR negative 01/30/2022 1705   BILIRUBINUR Negative 07/17/2020 1655   KETONESUR NEGATIVE 02/13/2022 1637   PROTEINUR NEGATIVE 02/13/2022 1637   UROBILINOGEN 0.2 01/30/2022 1705   UROBILINOGEN 1.0 06/20/2013 1032   NITRITE NEGATIVE 02/13/2022 1637   LEUKOCYTESUR NEGATIVE 02/13/2022 1637    Radiological Exams on Admission: DG Chest 2 View  Result Date: 06/14/2022 CLINICAL DATA:  chest pain EXAM: CHEST - 2 VIEW COMPARISON:  05/28/2022 FINDINGS: Cardiac silhouette enlarged. No evidence  of pneumothorax or pleural effusion. No evidence of pulmonary edema. No osseous abnormalities identified. There is a left-sided pacer. IMPRESSION: Enlarged cardiac silhouette.  Lungs are  clear. Electronically Signed   By: Sammie Bench M.D.   On: 06/14/2022 19:12    EKG: Independently reviewed.  Sinus rhythm, QTc 450.  No significant change since prior tracing.  Assessment and Plan  AKI Likely due to increased diuretic use for dyspnea.  Creatinine currently 2.6, baseline 1.7-2.0. -Gentle IV fluid hydration and monitor volume status closely given chronic HFrEF -Hold Lasix, Entresto, and spironolactone.  Avoid any other nephrotoxic agents. -Continue to monitor renal function  Dyspnea, chest pain, palpitations Lungs clear on chest x-ray.  Troponin x 1 negative.  EKG showing sinus rhythm and no acute ischemic changes.  LHC done last month showing nonobstructive CAD.  PE less likely given no tachycardia or hypoxia. -Cardiac monitoring -ICD interrogation pending -Trend troponin -Consult cardiology in a.m.  Chronic HFrEF NICM with ICD in place Recent echo done 05/06/2022 showing EF 30 to 32%, grade 1 diastolic dysfunction.  No signs of volume overload.  Instead appears dehydrated and has an AKI in the setting of increased diuretic use for dyspnea. -BNP pending -Hold Lasix, Entresto, spironolactone, and Farxiga at this time given AKI -Continue metoprolol  History of VT Status post device upgrade 05/15/2022. -ICD interrogation pending -Cardiac monitoring -Potassium 4.4, continue to monitor -Keep magnesium >2 -Continue amiodarone and metoprolol  Hypertension Blood pressure currently stable. -Continue metoprolol -Hold Lasix, Entresto, and spironolactone due to AKI.  Hyperlipidemia -Continue Lipitor  Insulin-dependent type 2 diabetes with neuropathy Poorly controlled -A1c 10.2 on 05/04/2022.  Glucose 310. -Continue home scheduled preprandial and basal insulin -Moderate sliding scale insulin -Continue gabapentin  Anxiety/panic attacks -Continue Prozac and Ativan as needed  BPH -Continue Flomax  DVT prophylaxis: Lovenox Code Status: Full Code (discussed  with the patient) Family Communication: Patient's brother at bedside. Level of care: Telemetry bed Admission status: It is my clinical opinion that referral for OBSERVATION is reasonable and necessary in this patient based on the above information provided. The aforementioned taken together are felt to place the patient at high risk for further clinical deterioration. However, it is anticipated that the patient may be medically stable for discharge from the hospital within 24 to 48 hours.   Shela Leff MD [facility]  If 7PM-7AM, please contact night-coverage [email or web address]  06/14/2022, 10:07 PM

## 2022-06-14 NOTE — ED Provider Notes (Signed)
[facility] EMERGENCY DEPARTMENT Provider Note   CSN: [id number] Arrival date & time: 06/14/22  1707     History  Chief Complaint  Patient presents with   Chest Pain   Shortness of Breath    [name] is a 57 y.o. male.  HPI 57 year old male with a history of cardiomyopathy, AICD, anxiety, and recent admission for ventricular arrhythmia presents with shortness of breath.  He states that he has been having some shortness of breath since his lead was replaced around a month ago.  However the shortness of breath he is experiencing is worse over the last 3 days.  He will get sudden attacks where he feels short of breath like he cannot catch his breath for just a few seconds and then gets better.  He is also been having some pin like chest pain inferior to his defibrillator that he states does not necessarily come with the shortness of breath.  No fevers, cough, vomiting.  Called cardiology yesterday and took an extra dose of Lasix.  However this morning he was feeling lightheaded when going to the bathroom and so he did not take a dose of Lasix today.  He has not noticed any leg swelling.  Patient states he also has anxiety and is on Prozac and Ativan.  However the anxiety attacks he has been experiencing have not felt like this.  Home Medications Prior to Admission medications   Medication Sig Start Date End Date Taking? Authorizing Provider  albuterol (VENTOLIN HFA) 108 (90 Base) MCG/ACT inhaler Inhale into the lungs every 6 (six) hours as needed for wheezing or shortness of breath.   Yes [provider]  amiodarone (PACERONE) 400 MG tablet Take 1 tablet (400 mg total) by mouth daily. 05/17/22  Yes Samuella Cota, MD  aspirin 81 MG tablet Take 81 mg by mouth daily.   Yes [provider]  atorvastatin (LIPITOR) 10 MG tablet Take 1 tablet (10 mg total) by mouth daily. 01/19/22  Yes Elsie Stain, MD  cetirizine (ZYRTEC) 10 MG tablet Take 1  tablet (10 mg total) by mouth daily. 01/30/22  Yes Valentina Shaggy, MD  cholecalciferol (VITAMIN D3) 25 MCG (1000 UNIT) tablet Take 1,000 Units by mouth daily.   Yes [provider]  clobetasol ointment (TEMOVATE) 0.62 % Apply 1 Application topically 2 (two) times daily. Patient taking differently: Apply 1 Application topically daily as needed (for eczema). 01/30/22  Yes Valentina Shaggy, MD  dapagliflozin propanediol (FARXIGA) 10 MG TABS tablet Take 1 tablet (10 mg total) by mouth daily before breakfast. 05/26/22  Yes [name], Aditya, DO  EPINEPHrine 0.3 mg/0.3 mL IJ SOAJ injection Inject 0.3 mg into the muscle as needed. 01/30/22  Yes Valentina Shaggy, MD  famotidine (PEPCID) 20 MG tablet Take 1 tablet by mouth 1-2 times a day as needed. Patient taking differently: Take 20 mg by mouth 2 (two) times daily as needed for heartburn or indigestion. 02/18/22  Yes Valentina Shaggy, MD  FLUoxetine (PROZAC) 10 MG capsule Take 1 capsule (10 mg total) by mouth daily. 05/26/22  Yes [name], Aditya, DO  fluticasone (FLONASE) 50 MCG/ACT nasal spray Use 1-2 sprays in each nostril once a day as needed for nasal congestion. 01/30/22  Yes Valentina Shaggy, MD  furosemide (LASIX) 80 MG tablet Take 1 tablet (80 mg total) by mouth daily as needed for edema or fluid. 05/17/22  Yes Samuella Cota, MD  gabapentin (NEURONTIN) 300 MG capsule TAKE  1 CAPSULE BY MOUTH EVERY DAY AT BEDTIME Patient taking differently: Take 300 mg by mouth at bedtime. 10/05/20  Yes [name], MD  insulin aspart (NOVOLOG FLEXPEN) 100 UNIT/ML FlexPen Inject 10 Units into the skin 2 (two) times daily before a meal. 04/07/22  Yes [name], MD  insulin glargine (LANTUS SOLOSTAR) 100 UNIT/ML Solostar Pen Inject 30 Units into the skin daily. 03/21/22  Yes [name], MD  ipratropium-albuterol (DUONEB) 0.5-2.5 (3) MG/3ML SOLN Take 3 mLs by nebulization every 6 (six) hours as needed. 03/21/22   Yes Juanito Doom, MD  LORazepam (ATIVAN) 0.5 MG tablet Take 1 tablet (0.5 mg total) by mouth 2 (two) times daily as needed for anxiety. 05/30/22  Yes [name], MD  methocarbamol (ROBAXIN) 500 MG tablet TAKE 1 TABLET BY MOUTH ONCE DAILY AS NEEDED FOR MUSCLE SPASM Patient taking differently: Take 500 mg by mouth daily as needed for muscle spasms. 04/25/22  Yes Rodena Goldmann A, DO  metoprolol succinate (TOPROL-XL) 50 MG 24 hr tablet Take 1 tablet (50 mg total) by mouth daily. 05/26/22  Yes [name], Aditya, DO  sacubitril-valsartan (ENTRESTO) 24-26 MG Take 1 tablet by mouth 2 (two) times daily. 04/21/22  Yes Lelon Perla, MD  sildenafil (VIAGRA) 100 MG tablet Take 1-2 tabs PO 1/2-1 hr prior to intercourse. 12/14/20  Yes [name], MD  spironolactone (ALDACTONE) 25 MG tablet Take 0.5 tablets (12.5 mg total) by mouth daily. 05/26/22  Yes [name], Aditya, DO  tamsulosin (FLOMAX) 0.4 MG CAPS capsule Take 2 capsules (0.8 mg total) by mouth daily. 01/19/22  Yes Elsie Stain, MD  triamcinolone cream (KENALOG) 0.1 % Apply sparingly to red itchy areas twice daily as needed. Avoid face, neck, armpits or groin area. Do not use more than 3 weeks in a row. 01/30/22  Yes Valentina Shaggy, MD  Accu-Chek Softclix Lancets lancets Use as instructed 02/01/22   Cristie Hem, MD  Blood Glucose Monitoring Suppl (ACCU-CHEK GUIDE) w/Device KIT UAD 02/01/22   Cristie Hem, MD  Continuous Blood Gluc Sensor (FREESTYLE LIBRE 2 SENSOR) MISC Use to check blood sugar TID. Change sensor once every 14 days. E11.65 03/06/22   [name], MD  glucose blood (ACCU-CHEK GUIDE) test strip Use as instructed 02/01/22   Cristie Hem, MD  Insulin Pen Needle (PEN NEEDLES) 31G X 8 MM MISC UAD 03/01/22   [name], MD  Lancet Devices [facility]) lancets 1 each by Other route 3 (three) times daily. 10/20/14   Funches, Adriana Mccallum, MD  potassium chloride SA (KLOR-CON M)  20 MEQ tablet Take 1 tablet (20 mEq total) by mouth 2 (two) times daily. Patient not taking: Reported on 06/14/2022 05/09/22   Hebert Soho, DO      Allergies    Bee venom and Trulicity [dulaglutide]    Review of Systems   Review of Systems  Constitutional:  Negative for fever.  Respiratory:  Positive for shortness of breath. Negative for cough.   Cardiovascular:  Positive for chest pain. Negative for leg swelling.  Gastrointestinal:  Negative for diarrhea and vomiting.  Psychiatric/Behavioral:  The patient is nervous/anxious.     Physical Exam Updated Vital Signs BP 127/87   Pulse 79   Temp 98.2 F (36.8 C) (Oral)   Resp 20   Ht 6' (1.829 m)   Wt 114.8 kg   SpO2 95%   BMI 34.31 kg/m  Physical Exam Vitals and nursing note reviewed.  Constitutional:  General: He is not in acute distress.    Appearance: He is well-developed. He is not ill-appearing or diaphoretic.  HENT:     Head: Normocephalic and atraumatic.  Cardiovascular:     Rate and Rhythm: Normal rate and regular rhythm.     Heart sounds: Normal heart sounds.  Pulmonary:     Effort: Pulmonary effort is normal.     Breath sounds: Normal breath sounds. No wheezing or rales.  Abdominal:     Palpations: Abdomen is soft.     Tenderness: There is no abdominal tenderness.  Musculoskeletal:     Right lower leg: No edema.     Left lower leg: No edema.  Skin:    General: Skin is warm and dry.  Neurological:     Mental Status: He is alert.     ED Results / Procedures / Treatments   Labs (all labs ordered are listed, but only abnormal results are displayed) Labs Reviewed  BASIC METABOLIC PANEL - Abnormal; Notable for the following components:      Result Value   CO2 21 (*)    Glucose, Bld 310 (*)    Creatinine, Ser 2.65 (*)    GFR, Estimated 27 (*)    All other components within normal limits  CBC WITH DIFFERENTIAL/PLATELET  BRAIN NATRIURETIC PEPTIDE  BASIC METABOLIC PANEL  MAGNESIUM  TROPONIN I  (HIGH SENSITIVITY)  TROPONIN I (HIGH SENSITIVITY)    EKG EKG Interpretation  Date/Time:  Saturday June 14 2022 17:59:24 EST Ventricular Rate:  69 PR Interval:  188 QRS Duration: 116 QT Interval:  420 QTC Calculation: 450 R Axis:   157 Text Interpretation: Normal sinus rhythm Right axis deviation Low voltage QRS Incomplete right bundle branch block Cannot rule out Anterior infarct , age undetermined  no significant change since Dec 2023 Confirmed by Sherwood Gambler [phone]) on 06/14/2022 6:48:54 PM  Radiology DG Chest 2 View  Result Date: 06/14/2022 CLINICAL DATA:  chest pain EXAM: CHEST - 2 VIEW COMPARISON:  05/28/2022 FINDINGS: Cardiac silhouette enlarged. No evidence of pneumothorax or pleural effusion. No evidence of pulmonary edema. No osseous abnormalities identified. There is a left-sided pacer. IMPRESSION: Enlarged cardiac silhouette.  Lungs are clear. Electronically Signed   By: Sammie Bench M.D.   On: 06/14/2022 19:12    Procedures Procedures    Medications Ordered in ED Medications  0.9 %  sodium chloride infusion (has no administration in time range)  aspirin chewable tablet 81 mg (has no administration in time range)  amiodarone (PACERONE) tablet 400 mg (has no administration in time range)  atorvastatin (LIPITOR) tablet 10 mg (has no administration in time range)  metoprolol succinate (TOPROL-XL) 24 hr tablet 50 mg (has no administration in time range)  FLUoxetine (PROZAC) capsule 10 mg (has no administration in time range)  LORazepam (ATIVAN) tablet 0.5 mg (has no administration in time range)  insulin aspart (novoLOG) injection 10 Units (has no administration in time range)  insulin glargine-yfgn (SEMGLEE) injection 30 Units (has no administration in time range)  tamsulosin (FLOMAX) capsule 0.8 mg (has no administration in time range)  gabapentin (NEURONTIN) capsule 300 mg (has no administration in time range)  enoxaparin (LOVENOX) injection 40 mg (has no  administration in time range)  acetaminophen (TYLENOL) tablet 650 mg (has no administration in time range)    Or  acetaminophen (TYLENOL) suppository 650 mg (has no administration in time range)  insulin aspart (novoLOG) injection 0-15 Units (has no administration in time range)  insulin aspart (novoLOG)  injection 0-5 Units (has no administration in time range)    ED Course/ Medical Decision Making/ A&P                           Medical Decision Making Amount and/or Complexity of Data Reviewed Labs: ordered.    Details: Acute kidney injury relative is at baseline, seems to be slowly worsening. Radiology: ordered and independent interpretation performed.    Details: No CHF ECG/medicine tests: independent interpretation performed.    Details: No acute ischemia  Risk Decision regarding hospitalization.   I think patient's symptoms are primarily from slowly worsening renal function.  Especially in combination with lightheadedness and an extra dose of Lasix he probably is a little dehydrated.  He does have systolic CHF, so we will gently rehydrate and give fluids.  I think you will need admission and make sure his renal function is improving.  Discussed with Dr. Marlowe Sax, she will admit.        Final Clinical Impression(s) / ED Diagnoses Final diagnoses:  Acute kidney injury [facility])    Rx / DC Orders ED Discharge Orders     None         Sherwood Gambler, MD 06/14/22 [phone]

## 2022-06-15 DIAGNOSIS — I5022 Chronic systolic (congestive) heart failure: Secondary | ICD-10-CM

## 2022-06-15 DIAGNOSIS — F419 Anxiety disorder, unspecified: Secondary | ICD-10-CM

## 2022-06-15 DIAGNOSIS — N179 Acute kidney failure, unspecified: Secondary | ICD-10-CM | POA: Diagnosis not present

## 2022-06-15 LAB — CBG MONITORING, ED
Glucose-Capillary: 184 mg/dL — ABNORMAL HIGH (ref 70–99)
Glucose-Capillary: 163 mg/dL — ABNORMAL HIGH (ref 70–99)
Glucose-Capillary: 175 mg/dL — ABNORMAL HIGH (ref 70–99)

## 2022-06-15 LAB — BASIC METABOLIC PANEL
Anion gap: 11 (ref 5–15)
BUN: 25 mg/dL — ABNORMAL HIGH (ref 6–20)
CO2: 24 mmol/L (ref 22–32)
Calcium: 9.1 mg/dL (ref 8.9–10.3)
Chloride: 102 mmol/L (ref 98–111)
Creatinine, Ser: 2.33 mg/dL — ABNORMAL HIGH (ref 0.61–1.24)
GFR, Estimated: 32 mL/min — ABNORMAL LOW (ref >60)
Glucose, Bld: 199 mg/dL — ABNORMAL HIGH (ref 70–99)
Potassium: 4.1 mmol/L (ref 3.5–5.1)
Sodium: 137 mmol/L (ref 135–145)

## 2022-06-15 LAB — TROPONIN I (HIGH SENSITIVITY): Troponin I (High Sensitivity): 7 ng/L (ref <18)

## 2022-06-15 LAB — MAGNESIUM: Magnesium: 2.3 mg/dL (ref 1.7–2.4)

## 2022-06-15 LAB — BRAIN NATRIURETIC PEPTIDE: B Natriuretic Peptide: 33.3 pg/mL (ref 0.0–100.0)

## 2022-06-15 LAB — GLUCOSE, CAPILLARY: Glucose-Capillary: 184 mg/dL — ABNORMAL HIGH (ref 70–99)

## 2022-06-15 MED ORDER — AMIODARONE HCL 200 MG PO TABS
200.0000 mg | ORAL_TABLET | Freq: Every day | ORAL | Status: DC
  Administered 2022-06-15 – 2022-06-16 (×2): 200 mg via ORAL
  Filled 2022-06-15 (×2): qty 1

## 2022-06-15 NOTE — ED Notes (Signed)
ED TO INPATIENT HANDOFF REPORT  ED Nurse Name and Phone #: [name] [phone]  S Name/Age/Gender [name] 57 y.o. male Room/Bed: 001C/001C  Code Status   Code Status: Full Code  Home/SNF/Other Home Patient oriented to: self, place, time, and situation Is this baseline? Yes   Triage Complete: Triage complete  Chief Complaint AKI (acute kidney injury) ([place]) [N17.9]  Triage Note Pt here with reports of feeling his heart fluttering and feeling shob onset yesterday.    Allergies Allergies  Allergen Reactions   Bee Venom Anaphylaxis   Trulicity [Dulaglutide] Diarrhea    Patient experience onset of diarrhea with using this medication - cleared after med stopped.    Level of Care/Admitting Diagnosis ED Disposition     ED Disposition  Admit   Condition  --   [place]: [place] [[id number]]  Level of Care: Telemetry Cardiac [103]  May place patient in observation at [facility] or [place] if equivalent level of care is available:: Yes  Covid Evaluation: Asymptomatic - no recent exposure (last 10 days) testing not required  Diagnosis: AKI (acute kidney injury) [facility]) [595638]  Admitting Physician: Shela Leff [[id number]]  Attending Physician: Shela Leff [[id number]]          B Medical/Surgery History Past Medical History:  Diagnosis Date   AICD (automatic cardioverter/defibrillator) present    Chronic bronchitis ([place])    "get it most q yr" (12/23/2013)   Chronic systolic (congestive) heart failure ([place])    Fracture of left humerus    a. 07/2013.   GERD (gastroesophageal reflux disease)    not at present time   Heart murmur    "born w/it"    History of renal calculi    Hypertension    Kidney stones    NICM (nonischemic cardiomyopathy) ([place])    a. 07/2013 Echo: EF 20-25%, diff HK, Gr2 DD, mild MR, mod dil LA/RA. 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    Shockable heart rhythm detected by automated external defibrillator    Syncope    Type II diabetes mellitus ([place]) 2006   Past Surgical History:  Procedure Laterality Date   CARDIAC CATHETERIZATION  08/10/13   CHOLECYSTECTOMY  ~ 2010   IMPLANTABLE CARDIOVERTER DEFIBRILLATOR IMPLANT  12/23/2013   STJ Fortify ICD implanted by [name] for cardiomyopathy and syncope   IMPLANTABLE CARDIOVERTER DEFIBRILLATOR IMPLANT N/A 12/23/2013   Procedure: IMPLANTABLE CARDIOVERTER DEFIBRILLATOR IMPLANT;  Surgeon: Coralyn Mark, MD;  Location: [facility] CATH LAB;  Service: Cardiovascular;  Laterality: N/A;   INGUINAL HERNIA REPAIR Left 03/01/2018   Procedure: OPEN REPAIR OF LEFT INGUINAL HERNIA WITH MESH;  Surgeon: Greer Pickerel, MD;  Location: [facility] ORS;  Service: General;  Laterality: Left;   LEAD REVISION/REPAIR N/A 05/15/2022   Procedure: LEAD REVISION/REPAIR;  Surgeon: Vickie Epley, MD;  Location: [place] CV LAB;  Service: Cardiovascular;  Laterality: N/A;   LEFT HEART CATHETERIZATION WITH CORONARY ANGIOGRAM N/A 08/10/2013   Procedure: LEFT HEART CATHETERIZATION WITH CORONARY ANGIOGRAM;  Surgeon: Jettie Booze, MD;  Location: [facility] CATH LAB;  Service: Cardiovascular;  Laterality: N/A;   ORIF HUMERUS FRACTURE Left 08/12/2013   Procedure: OPEN REDUCTION INTERNAL FIXATION (ORIF) HUMERAL SHAFT FRACTURE;  Surgeon: Newt Minion, MD;  Location: [place];  Service: Orthopedics;  Laterality: Left;  Open Reduction Internal Fixation Left Humerus   RIGHT/LEFT HEART CATH AND CORONARY ANGIOGRAPHY N/A 05/12/2022  Procedure: RIGHT/LEFT HEART CATH AND CORONARY ANGIOGRAPHY;  Surgeon: Hebert Soho, DO;  Location: [place] CV LAB;  Service: Cardiovascular;  Laterality: N/A;   URETEROSCOPY     "laser for kidney stones"     A IV Location/Drains/Wounds Patient Lines/Drains/Airways Status     Active Line/Drains/Airways     Name Placement date Placement time Site Days   Peripheral IV  06/14/22 22 G 1" Right;Anterior Forearm 06/14/22  2140  Forearm  1   Sheath 05/12/22 Right Femoral;Arterial 05/12/22  1602  Femoral;Arterial  34            Intake/Output Last 24 hours No intake or output data in the 24 hours ending 06/15/22 1645  Labs/Imaging Results for orders placed or performed during the hospital encounter of 06/14/22 (from the past 48 hour(s))  Basic metabolic panel     Status: Abnormal   Collection Time: 06/14/22  6:14 PM  Result Value Ref Range   Sodium 136 135 - 145 mmol/L   Potassium 4.4 3.5 - 5.1 mmol/L   Chloride 100 98 - 111 mmol/L   CO2 21 (L) 22 - 32 mmol/L   Glucose, Bld 310 (H) 70 - 99 mg/dL    Comment: Glucose reference range applies only to samples taken after fasting for at least 8 hours.   BUN 19 6 - 20 mg/dL   Creatinine, Ser 2.65 (H) 0.61 - 1.24 mg/dL   Calcium 9.0 8.9 - 10.3 mg/dL   GFR, Estimated 27 (L) >60 mL/min    Comment: (NOTE) Calculated using the CKD-EPI Creatinine Equation (2021)    Anion gap 15 5 - 15    Comment: Performed at [facility] [address], [place], [place] [address]  Troponin I (High Sensitivity)     Status: None   Collection Time: 06/14/22  6:14 PM  Result Value Ref Range   Troponin I (High Sensitivity) 6 <18 ng/L    Comment: (NOTE) Elevated high sensitivity troponin I (hsTnI) values and significant  changes across serial measurements may suggest ACS but many other  chronic and acute conditions are known to elevate hsTnI results.  Refer to the "Links" section for chest pain algorithms and additional  guidance. Performed at [facility], [place] [address], [place], [place] [address]   CBC with Differential     Status: None   Collection Time: 06/14/22  6:20 PM  Result Value Ref Range   WBC 8.3 4.0 - 10.5 K/uL   RBC 4.55 4.22 - 5.81 MIL/uL   Hemoglobin 13.6 13.0 - 17.0 g/dL   HCT 41.0 39.0 - 52.0 %   MCV 90.1 80.0 - 100.0 fL   MCH 29.9 26.0 - 34.0 pg   MCHC 33.2 30.0 - 36.0 g/dL   RDW 14.3  11.5 - 15.5 %   Platelets 335 150 - 400 K/uL   nRBC 0.0 0.0 - 0.2 %   Neutrophils Relative % 60 %   Neutro Abs 5.0 1.7 - 7.7 K/uL   Lymphocytes Relative 32 %   Lymphs Abs 2.6 0.7 - 4.0 K/uL   Monocytes Relative 6 %   Monocytes Absolute 0.5 0.1 - 1.0 K/uL   Eosinophils Relative 1 %   Eosinophils Absolute 0.1 0.0 - 0.5 K/uL   Basophils Relative 1 %   Basophils Absolute 0.1 0.0 - 0.1 K/uL   Immature Granulocytes 0 %   Abs Immature Granulocytes 0.03 0.00 - 0.07 K/uL    Comment: Performed at [facility], [address] [place], [place] [address]  Brain natriuretic peptide     Status: None   Collection Time: 06/14/22 11:00 PM  Result Value Ref Range   B Natriuretic Peptide 33.3 0.0 - 100.0 pg/mL    Comment: Performed at [facility] [address] [place], [place] [address]  Troponin I (High Sensitivity)     Status: None   Collection Time: 06/14/22 11:00 PM  Result Value Ref Range   Troponin I (High Sensitivity) 7 <18 ng/L    Comment: (NOTE) Elevated high sensitivity troponin I (hsTnI) values and significant  changes across serial measurements may suggest ACS but many other  chronic and acute conditions are known to elevate hsTnI results.  Refer to the "Links" section for chest pain algorithms and additional  guidance. Performed at [facility], [place] [address] [place], [place] [address]   CBG monitoring, ED     Status: Abnormal   Collection Time: 06/14/22 11:46 PM  Result Value Ref Range   Glucose-Capillary 156 (H) 70 - 99 mg/dL    Comment: Glucose reference range applies only to samples taken after fasting for at least 8 hours.  CBG monitoring, ED     Status: Abnormal   Collection Time: 06/15/22  8:07 AM  Result Value Ref Range   Glucose-Capillary 184 (H) 70 - 99 mg/dL    Comment: Glucose reference range applies only to samples taken after fasting for at least 8 hours.  CBG monitoring, ED     Status: Abnormal   Collection Time: 06/15/22 12:12 PM  Result  Value Ref Range   Glucose-Capillary 163 (H) 70 - 99 mg/dL    Comment: Glucose reference range applies only to samples taken after fasting for at least 8 hours.   *Note: Due to a large number of results and/or encounters for the requested time period, some results have not been displayed. A complete set of results can be found in Results Review.   DG Chest 2 View  Result Date: 06/14/2022 CLINICAL DATA:  chest pain EXAM: CHEST - 2 VIEW COMPARISON:  05/28/2022 FINDINGS: Cardiac silhouette enlarged. No evidence of pneumothorax or pleural effusion. No evidence of pulmonary edema. No osseous abnormalities identified. There is a left-sided pacer. IMPRESSION: Enlarged cardiac silhouette.  Lungs are clear. Electronically Signed   By: Sammie Bench M.D.   On: 06/14/2022 19:12    Pending Labs Unresulted Labs (From admission, onward)     Start     Ordered   06/15/22 9371  Basic metabolic panel  Tomorrow morning,   R        06/14/22 2316   06/15/22 0500  Magnesium  Tomorrow morning,   R        06/14/22 2316            Vitals/Pain Today's Vitals   06/15/22 0938 06/15/22 1100 06/15/22 1330 06/15/22 1525  BP: 111/65 124/76 (!) 110/58   Pulse: 63 63 63   Resp: (!) 21 19 (!) 22   Temp:    97.7 F (36.5 C)  TempSrc:    Oral  SpO2: 95% 94% 95%   Weight:      Height:      PainSc:        Isolation Precautions No active isolations  Medications Medications  0.9 %  sodium chloride infusion (0 mLs Intravenous Stopped 06/15/22 1208)  aspirin chewable tablet 81 mg (81 mg Oral Given 06/15/22 0931)  atorvastatin (LIPITOR) tablet 10 mg (10 mg Oral  Given 06/15/22 0930)  metoprolol succinate (TOPROL-XL) 24 hr tablet 50 mg (50 mg Oral Given 06/15/22 0931)  FLUoxetine (PROZAC) capsule 10 mg (10 mg Oral Given 06/15/22 0931)  LORazepam (ATIVAN) tablet 0.5 mg (0.5 mg Oral Given 06/14/22 2354)  insulin aspart (novoLOG) injection 10 Units (10 Units Subcutaneous Not Given 06/15/22 0936)  insulin glargine-yfgn  (SEMGLEE) injection 30 Units (30 Units Subcutaneous Patient Refused/Not Given 06/15/22 0015)  tamsulosin (FLOMAX) capsule 0.8 mg (0.8 mg Oral Given 06/15/22 0930)  gabapentin (NEURONTIN) capsule 300 mg (300 mg Oral Given 06/14/22 2349)  enoxaparin (LOVENOX) injection 40 mg (40 mg Subcutaneous Given 06/15/22 0932)  acetaminophen (TYLENOL) tablet 650 mg (has no administration in time range)    Or  acetaminophen (TYLENOL) suppository 650 mg (has no administration in time range)  insulin aspart (novoLOG) injection 0-15 Units (3 Units Subcutaneous Given 06/15/22 1231)  insulin aspart (novoLOG) injection 0-5 Units ( Subcutaneous Not Given 06/14/22 2351)  amiodarone (PACERONE) tablet 200 mg (200 mg Oral Given 06/15/22 0930)    Mobility walks Low fall risk   Focused Assessments Pulmonary Assessment Handoff:  Lung sounds:   O2 Device: Room Air      R Recommendations: See Admitting Provider Note  Report given to:   Additional Notes:

## 2022-06-15 NOTE — Consult Note (Signed)
Electrophysiology Consultation   Patient ID: [name] MRN: [id number]; DOB: [birth year]  Admit date: 06/14/2022 Date of Consult: 06/15/2022  PCP:  [name], MD   [facility] Cardiologist:  [name], MD  Electrophysiologist:  Vickie Epley, MD  {   Patient Profile:   [name] is a 57 y.o. male with a hx of NICM/HFrEF s/p ICD, HTN, DM, recent VT/VF storm who is being seen 06/15/2022 for the evaluation of dyspnea at the request of Dr Nevada Crane.  History of Present Illness:   [name] presented today with chronic shortness of breath. He describes brief spells of shortness of breath lasting about 5 seconds at a time. They are worse when he lays flat. He has added pillows to his bed and now sleeps on about 5-7 at night to help his breathing. He feels anxiety re: hsi condition especially with his recent VT storm episode.   Past Medical History:  Diagnosis Date   AICD (automatic cardioverter/defibrillator) present    Chronic bronchitis ([place])    "get it most q yr" (12/23/2013)   Chronic systolic (congestive) heart failure ([place])    Fracture of left humerus    a. 07/2013.   GERD (gastroesophageal reflux disease)    not at present time   Heart murmur    "born w/it"    History of renal calculi    Hypertension    Kidney stones    NICM (nonischemic cardiomyopathy) ([place])    a. 07/2013 Echo: EF 20-25%, diff HK, Gr2 DD, mild [name], mod dil LA/RA. 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    Shockable heart rhythm detected by automated external defibrillator    Syncope    Type II diabetes mellitus ([place]) 2006    Past Surgical History:  Procedure Laterality Date   CARDIAC CATHETERIZATION  08/10/13   CHOLECYSTECTOMY  ~ 2010   IMPLANTABLE CARDIOVERTER DEFIBRILLATOR IMPLANT  12/23/2013   STJ Fortify ICD implanted by [name] for cardiomyopathy and syncope   IMPLANTABLE  CARDIOVERTER DEFIBRILLATOR IMPLANT N/A 12/23/2013   Procedure: IMPLANTABLE CARDIOVERTER DEFIBRILLATOR IMPLANT;  Surgeon: Coralyn Mark, MD;  Location: [facility] CATH LAB;  Service: Cardiovascular;  Laterality: N/A;   INGUINAL HERNIA REPAIR Left 03/01/2018   Procedure: OPEN REPAIR OF LEFT INGUINAL HERNIA WITH MESH;  Surgeon: Greer Pickerel, MD;  Location: [facility] ORS;  Service: General;  Laterality: Left;   LEAD REVISION/REPAIR N/A 05/15/2022   Procedure: LEAD REVISION/REPAIR;  Surgeon: Vickie Epley, MD;  Location: [facility] CV LAB;  Service: Cardiovascular;  Laterality: N/A;   LEFT HEART CATHETERIZATION WITH CORONARY ANGIOGRAM N/A 08/10/2013   Procedure: LEFT HEART CATHETERIZATION WITH CORONARY ANGIOGRAM;  Surgeon: Jettie Booze, MD;  Location: [facility] CATH LAB;  Service: Cardiovascular;  Laterality: N/A;   ORIF HUMERUS FRACTURE Left 08/12/2013   Procedure: OPEN REDUCTION INTERNAL FIXATION (ORIF) HUMERAL SHAFT FRACTURE;  Surgeon: Newt Minion, MD;  Location: [facility];  Service: Orthopedics;  Laterality: Left;  Open Reduction Internal Fixation Left Humerus   RIGHT/LEFT HEART CATH AND CORONARY ANGIOGRAPHY N/A 05/12/2022   Procedure: RIGHT/LEFT HEART CATH AND CORONARY ANGIOGRAPHY;  Surgeon: Hebert Soho, DO;  Location: [facility] CV LAB;  Service: Cardiovascular;  Laterality: N/A;   URETEROSCOPY     "laser for kidney stones"       Inpatient Medications: Scheduled Meds:  amiodarone  400 mg Oral Daily   aspirin  81 mg Oral Daily   atorvastatin  10 mg Oral Daily   enoxaparin (LOVENOX) injection  40 mg Subcutaneous Q24H   FLUoxetine  10 mg Oral Daily   gabapentin  300 mg Oral QHS   insulin aspart  0-15 Units Subcutaneous TID WC   insulin aspart  0-5 Units Subcutaneous QHS   insulin aspart  10 Units Subcutaneous BID AC   insulin glargine-yfgn  30 Units Subcutaneous QHS   metoprolol succinate  50 mg Oral Daily   tamsulosin  0.8 mg Oral Daily   Continuous Infusions:  sodium chloride 75 mL/hr at  06/14/22 2348   PRN Meds: acetaminophen **OR** acetaminophen, LORazepam  Allergies:    Allergies  Allergen Reactions   Bee Venom Anaphylaxis   Trulicity [Dulaglutide] Diarrhea    Patient experience onset of diarrhea with using this medication - cleared after med stopped.    Social History:   Social History   Socioeconomic History   Marital status: Single    Spouse name: Not on file   Number of children: Not on file   Years of education: Not on file   Highest education level: Not on file  Occupational History   Occupation: Disabled  Tobacco Use   Smoking status: Former    Packs/day: 1.00    Years: 28.00    Total pack years: 28.00    Types: Cigarettes    Quit date: 06/03/2014    Years since quitting: 8.0   Smokeless tobacco: Never  Vaping Use   Vaping Use: Never used  Substance and Sexual Activity   Alcohol use: Yes   Drug use: Yes    Types: Marijuana    Comment: occasionally   Sexual activity: Yes  Other Topics Concern   Not on file  Social History Narrative   Pt lives with his brother    Social Determinants of Health   Financial Resource Strain: High Risk (05/08/2022)   Overall Financial Resource Strain (CARDIA)    Difficulty of Paying Living Expenses: Very hard  Food Insecurity: No Food Insecurity (05/14/2022)   Hunger Vital Sign    Worried About Running Out of Food in the Last Year: Never true    [place] in the Last Year: Never true  Transportation Needs: No Transportation Needs (05/06/2022)   PRAPARE - Hydrologist (Medical): No    Lack of Transportation (Non-Medical): No  Physical Activity: Not on file  Stress: Not on file  Social Connections: Moderately Integrated (03/12/2021)   Social Connection and Isolation Panel [NHANES]    Frequency of Communication with Friends and Family: More than three times a week    Frequency of Social Gatherings with Friends and Family: More than three times a week    Attends Religious  Services: More than 4 times per year    Active Member of [employer] or Organizations: Yes    Attends Archivist Meetings: Never    Marital Status: Never married  Intimate Partner Violence: Not At Risk (05/06/2022)   Humiliation, Afraid, Rape, and Kick questionnaire    Fear of Current or Ex-Partner: No    Emotionally Abused: No    Physically Abused: No    Sexually Abused: No    Family History:    Family History  Problem Relation Age of Onset   Diabetes Father    Arthritis Father    Hypertension Father    Diabetes Mother    Arthritis Mother    Hypertension Mother  Hypertension Brother      ROS:  Please see the history of present illness.   All other ROS reviewed and negative.     Physical Exam/Data:   Vitals:   06/15/22 0545 06/15/22 0600 06/15/22 0615 06/15/22 0630  BP: 100/61 98/61 103/64 95/63  Pulse: 61 63 63 63  Resp: 15 18 17 17   Temp:      TempSrc:      SpO2: 97% 94% 95% 94%  Weight:      Height:       No intake or output data in the 24 hours ending 06/15/22 0902    06/14/2022    7:02 PM 05/28/2022    4:57 PM 05/26/2022   10:32 AM  Last 3 Weights  Weight (lbs) 253 lb 254 lb 13.6 oz 254 lb 12.8 oz  Weight (kg) 114.76 kg 115.6 kg 115.577 kg     Body mass index is 34.31 kg/m.  General:  Well nourished, well developed, in no acute distress. Obese. At 45 degrees. HEENT: normal Neck: no JVD Vascular: No carotid bruits; Distal pulses 2+ bilaterally Cardiac:  normal S1, S2; RRR; no murmur. ICD pocket well healed Lungs:  clear to auscultation bilaterally, no wheezing, rhonchi or rales  Abd: soft, nontender, no hepatomegaly  Ext: no edema Musculoskeletal:  No deformities, BUE and BLE strength normal and equal Skin: warm and dry  Neuro:  CNs 2-12 intact, no focal abnormalities noted Psych:  Normal affect   EKG:  The EKG was personally reviewed and demonstrates:  AsVs, sinus. Telemetry:  Telemetry was personally reviewed and demonstrates:  AsVs,  sinus.  Relevant CV Studies:  05/06/2022 Echo EF 30 RV normal No [name]   Laboratory Data:  High Sensitivity Troponin:   Recent Labs  Lab 05/28/22 1800 06/14/22 1814 06/14/22 2300  TROPONINIHS 5 6 7      Chemistry Recent Labs  Lab 06/14/22 1814  NA 136  K 4.4  CL 100  CO2 21*  GLUCOSE 310*  BUN 19  CREATININE 2.65*  CALCIUM 9.0  GFRNONAA 27*  ANIONGAP 15    No results for input(s): "PROT", "ALBUMIN", "AST", "ALT", "ALKPHOS", "BILITOT" in the last 168 hours. Lipids No results for input(s): "CHOL", "TRIG", "HDL", "LABVLDL", "LDLCALC", "CHOLHDL" in the last 168 hours.  Hematology Recent Labs  Lab 06/14/22 1820  WBC 8.3  RBC 4.55  HGB 13.6  HCT 41.0  MCV 90.1  MCH 29.9  MCHC 33.2  RDW 14.3  PLT 335   Thyroid No results for input(s): "TSH", "FREET4" in the last 168 hours.  BNP Recent Labs  Lab 06/14/22 2300  BNP 33.3    DDimer No results for input(s): "DDIMER" in the last 168 hours.   Radiology/Studies:  DG Chest 2 View  Result Date: 06/14/2022 CLINICAL DATA:  chest pain EXAM: CHEST - 2 VIEW COMPARISON:  05/28/2022 FINDINGS: Cardiac silhouette enlarged. No evidence of pneumothorax or pleural effusion. No evidence of pulmonary edema. No osseous abnormalities identified. There is a left-sided pacer. IMPRESSION: Enlarged cardiac silhouette.  Lungs are clear. Electronically Signed   By: Sammie Bench M.D.   On: 06/14/2022 19:12     Assessment and Plan:   [name] is a 57yo man with chronic sysolic heart failure, ICD with recent admission for VT storm who presents with chronic intermittent shortness of breath. His episodes sound like PND vs anxiety given he is not fluid overloaded by exam, CXR and labwork.  #chronic systolic HF #NICM NYHA II. Warm and dry on  exam. EF 30 Cont home medications  #hx of VT/VF #ICD in situ Device functioning appropriately. Interrogation pending Continue amiodarone but decrease to 200mg  PO daily  #anxiety I suspect a  lot of his symptoms are related to his severe anxiety which is very understandable given his history. Outpatient virtual psychology appointment is already scheduled.    For questions or updates, please contact [place] Please consult [email or web address] for contact info under    Signed, Vickie Epley, MD  06/15/2022 9:02 AM

## 2022-06-15 NOTE — Plan of Care (Signed)
  Problem: Education: Goal: Ability to describe self-care measures that may prevent or decrease complications (Diabetes Survival Skills Education) will improve Outcome: Progressing   Problem: Metabolic: Goal: Ability to maintain appropriate glucose levels will improve Outcome: Progressing   Problem: Education: Goal: Knowledge of General Education information will improve Description: Including pain rating scale, medication(s)/side effects and non-pharmacologic comfort measures Outcome: Progressing   Problem: Clinical Measurements: Goal: Cardiovascular complication will be avoided Outcome: Progressing   Problem: Elimination: Goal: Will not experience complications related to bowel motility Outcome: Progressing

## 2022-06-15 NOTE — ED Notes (Signed)
Patient ambulated to bathroom no distress noted.

## 2022-06-15 NOTE — Plan of Care (Signed)
  Problem: Education: Goal: Ability to describe self-care measures that may prevent or decrease complications (Diabetes Survival Skills Education) will improve Outcome: Progressing Goal: Individualized Educational Video(s) Outcome: Progressing   Problem: Coping: Goal: Ability to adjust to condition or change in health will improve Outcome: Progressing   

## 2022-06-15 NOTE — Progress Notes (Signed)
PROGRESS NOTE  [name]:[id number] DOB: 11/22/1965 DOA: 06/14/2022 PCP: [name], MD  HPI/Recap of past 24 hours: [name] is a 57 y.o. male with medical history significant of chronic HFrEF (EF 30 to 35%), ischemic cardiomyopathy, status post ICD, hypertension, type 2 diabetes, GERD.  Recently admitted 11/25-12/9 for V. tach arrest and VT storm in the setting of hypokalemia and underwent upgrade of atrial lead placement.  LHC showing nonobstructive CAD.  He was placed on amiodarone and metoprolol.  Also treated for cellulitis of buttocks, gout, diarrhea, AKI, and hyponatremia.   Patient presents to the ED today complaining of shortness of breath, chest pain, and palpitations.  Has been feeling very anxious and having frequent panic attacks after his hospitalization last month and has been started on Ativan and Prozac to help with his anxiety.  Continued to have shortness of breath but much worse for the past 3 days.  Has short bursts of dyspnea lasting seconds intermittently, and resolving spontaneously.  States this has been happening for the past 5+ years but has gotten worse.  States his primary care provider had deferred to cardiology for this problem.  06/15/2022: The patient was seen and examined at his bedside.  His brother was present in the room.  Had another episode of brief bout of dyspnea, lasting seconds.  Denies having a sedentary lifestyle, denies any edema or pain in his lower extremities, no recent lengthy trips.  Assessment/Plan: Principal Problem:   AKI (acute kidney injury) ([place]) Active Problems:   Anxiety state   Chest pain   Nonischemic cardiomyopathy ([place])   ICD (implantable cardioverter-defibrillator) in place   Insulin dependent type 2 diabetes mellitus ([place])   Dyspnea   History of ventricular tachycardia   Essential hypertension  Dyspnea, chest pain, palpitations Brief, frequent, episodes of dyspnea, lasting seconds, and resolved  spontaneously. Lungs clear on chest x-ray.  High-sensitivity troponin x 2 negative.  EKG showing sinus rhythm and no acute ischemic changes.  LHC done last month showing nonobstructive CAD.   PE less likely given no tachycardia or hypoxia. -Cardiac monitoring -ICD interrogation  Seen by cardiology, appreciate assistance.  AKI on CKD 3B Likely due to increased diuretic use for dyspnea.  Creatinine currently 2.6, baseline 1.7-2.0. -Hold Lasix, Entresto, and spironolactone.  Avoid any other nephrotoxic agents at this time. Repeat renal function test in the morning. Monitor urine output.  Chronic combined diastolic and systolic CHF NICM with ICD in place Recent echo done 05/06/2022 showing EF 30 to 16%, grade 1 diastolic dysfunction.  No signs of volume overload.  Instead appears dehydrated and has an AKI in the setting of increased diuretic use for dyspnea. -BNP 33 -Hold Lasix, Entresto, spironolactone, and Farxiga at this time given AKI Currently on metoprolol and amiodarone Cardiology following.   History of VT Status post device upgrade 05/15/2022. -ICD interrogation pending -Cardiac monitoring -Potassium 4.4, continue to monitor -Keep magnesium >2 -Continue amiodarone and metoprolol   Hypertension Blood pressure currently stable. -Continue metoprolol -Hold Lasix, Entresto, and spironolactone due to AKI.   Hyperlipidemia -Continue Lipitor   Insulin-dependent type 2 diabetes with neuropathy Poorly controlled -A1c 10.2 on 05/04/2022.  Glucose 310. -Continue home scheduled preprandial and basal insulin -Moderate sliding scale insulin -Continue gabapentin   Anxiety/panic attacks -Continue Prozac and Ativan as needed   BPH -Continue Flomax   DVT prophylaxis: Lovenox subcu daily Code Status: Full Code (discussed with the patient) Family Communication: Updated patient's brother at bedside. Level of care:  Telemetry bed    Consultants: Cardiology  Procedures: None.    Antimicrobials: None.  DVT prophylaxis: Subcu Lovenox daily  Status is: Observation     Objective: Vitals:   06/15/22 0938 06/15/22 1100 06/15/22 1330 06/15/22 1525  BP: 111/65 124/76 (!) 110/58   Pulse: 63 63 63   Resp: (!) 21 19 (!) 22   Temp:    97.7 F (36.5 C)  TempSrc:    Oral  SpO2: 95% 94% 95%   Weight:      Height:       No intake or output data in the 24 hours ending 06/15/22 1641 Filed Weights   06/14/22 1902  Weight: 114.8 kg    Exam:  General: 57 y.o. year-old male well developed well nourished in no acute distress.  Alert and oriented x3. Cardiovascular: Regular rate and rhythm with no rubs or gallops.  No thyromegaly or JVD noted.   Respiratory: Clear to auscultation with no wheezes or rales. Good inspiratory effort. Abdomen: Soft nontender nondistended with normal bowel sounds x4 quadrants. Musculoskeletal: No lower extremity edema. 2/4 pulses in all 4 extremities. Skin: No ulcerative lesions noted or rashes, Psychiatry: Mood is appropriate for condition and setting   Data Reviewed: CBC: Recent Labs  Lab 06/14/22 1820  WBC 8.3  NEUTROABS 5.0  HGB 13.6  HCT 41.0  MCV 90.1  PLT 850   Basic Metabolic Panel: Recent Labs  Lab 06/14/22 1814  NA 136  K 4.4  CL 100  CO2 21*  GLUCOSE 310*  BUN 19  CREATININE 2.65*  CALCIUM 9.0   GFR: Estimated Creatinine Clearance: 40.2 mL/min (A) (by C-G formula based on SCr of 2.65 mg/dL (H)). Liver Function Tests: No results for input(s): "AST", "ALT", "ALKPHOS", "BILITOT", "PROT", "ALBUMIN" in the last 168 hours. No results for input(s): "LIPASE", "AMYLASE" in the last 168 hours. No results for input(s): "AMMONIA" in the last 168 hours. Coagulation Profile: No results for input(s): "INR", "PROTIME" in the last 168 hours. Cardiac Enzymes: No results for input(s): "CKTOTAL", "CKMB", "CKMBINDEX", "TROPONINI" in the last 168 hours. BNP (last 3 results) Recent Labs    01/09/22 1002  03/21/22 1452  PROBNP 97 18.0   HbA1C: No results for input(s): "HGBA1C" in the last 72 hours. CBG: Recent Labs  Lab 06/14/22 2346 06/15/22 0807 06/15/22 1212  GLUCAP 156* 184* 163*   Lipid Profile: No results for input(s): "CHOL", "HDL", "LDLCALC", "TRIG", "CHOLHDL", "LDLDIRECT" in the last 72 hours. Thyroid Function Tests: No results for input(s): "TSH", "T4TOTAL", "FREET4", "T3FREE", "THYROIDAB" in the last 72 hours. Anemia Panel: No results for input(s): "VITAMINB12", "FOLATE", "FERRITIN", "TIBC", "IRON", "RETICCTPCT" in the last 72 hours. Urine analysis:    Component Value Date/Time   COLORURINE STRAW (A) 02/13/2022 1637   APPEARANCEUR CLEAR 02/13/2022 1637   APPEARANCEUR Clear 07/17/2020 1655   LABSPEC 1.015 02/13/2022 1637   PHURINE 5.0 02/13/2022 1637   GLUCOSEU >=500 (A) 02/13/2022 1637   HGBUR NEGATIVE 02/13/2022 1637   BILIRUBINUR NEGATIVE 02/13/2022 1637   BILIRUBINUR negative 01/30/2022 1705   BILIRUBINUR Negative 07/17/2020 1655   KETONESUR NEGATIVE 02/13/2022 1637   PROTEINUR NEGATIVE 02/13/2022 1637   UROBILINOGEN 0.2 01/30/2022 1705   UROBILINOGEN 1.0 06/20/2013 1032   NITRITE NEGATIVE 02/13/2022 1637   LEUKOCYTESUR NEGATIVE 02/13/2022 1637   Sepsis Labs: @LABRCNTIP (procalcitonin:4,lacticidven:4)  )No results found for this or any previous visit (from the past 240 hour(s)).    Studies: DG Chest 2 View  Result Date: 06/14/2022 CLINICAL DATA:  chest  pain EXAM: CHEST - 2 VIEW COMPARISON:  05/28/2022 FINDINGS: Cardiac silhouette enlarged. No evidence of pneumothorax or pleural effusion. No evidence of pulmonary edema. No osseous abnormalities identified. There is a left-sided pacer. IMPRESSION: Enlarged cardiac silhouette.  Lungs are clear. Electronically Signed   By: Sammie Bench M.D.   On: 06/14/2022 19:12    Scheduled Meds:  amiodarone  200 mg Oral Daily   aspirin  81 mg Oral Daily   atorvastatin  10 mg Oral Daily   enoxaparin (LOVENOX)  injection  40 mg Subcutaneous Q24H   FLUoxetine  10 mg Oral Daily   gabapentin  300 mg Oral QHS   insulin aspart  0-15 Units Subcutaneous TID WC   insulin aspart  0-5 Units Subcutaneous QHS   insulin aspart  10 Units Subcutaneous BID AC   insulin glargine-yfgn  30 Units Subcutaneous QHS   metoprolol succinate  50 mg Oral Daily   tamsulosin  0.8 mg Oral Daily    Continuous Infusions:   LOS: 0 days     Kayleen Memos, MD [facility] Pager [phone]  If 7PM-7AM, please contact night-coverage [email or web address] Password [facility] 06/15/2022, 4:41 PM

## 2022-06-16 DIAGNOSIS — N179 Acute kidney failure, unspecified: Secondary | ICD-10-CM | POA: Diagnosis not present

## 2022-06-16 DIAGNOSIS — F419 Anxiety disorder, unspecified: Secondary | ICD-10-CM | POA: Diagnosis not present

## 2022-06-16 DIAGNOSIS — I5022 Chronic systolic (congestive) heart failure: Secondary | ICD-10-CM | POA: Diagnosis not present

## 2022-06-16 LAB — GLUCOSE, CAPILLARY
Glucose-Capillary: 174 mg/dL — ABNORMAL HIGH (ref 70–99)
Glucose-Capillary: 148 mg/dL — ABNORMAL HIGH (ref 70–99)

## 2022-06-16 LAB — BASIC METABOLIC PANEL
Anion gap: 9 (ref 5–15)
BUN: 23 mg/dL — ABNORMAL HIGH (ref 6–20)
CO2: 26 mmol/L (ref 22–32)
Calcium: 9 mg/dL (ref 8.9–10.3)
Chloride: 103 mmol/L (ref 98–111)
Creatinine, Ser: 2.07 mg/dL — ABNORMAL HIGH (ref 0.61–1.24)
GFR, Estimated: 37 mL/min — ABNORMAL LOW (ref >60)
Glucose, Bld: 186 mg/dL — ABNORMAL HIGH (ref 70–99)
Potassium: 4.1 mmol/L (ref 3.5–5.1)
Sodium: 138 mmol/L (ref 135–145)

## 2022-06-16 MED ORDER — HYDROXYZINE HCL 10 MG PO TABS: 10.0000 mg | ORAL_TABLET | Freq: Three times a day (TID) | ORAL | 0 refills | Status: DC | PRN

## 2022-06-16 MED ORDER — HYDROXYZINE HCL 10 MG PO TABS: 10.0000 mg | ORAL_TABLET | Freq: Three times a day (TID) | ORAL | Status: DC | PRN

## 2022-06-16 MED ORDER — AMIODARONE HCL 200 MG PO TABS: 200.0000 mg | ORAL_TABLET | Freq: Every day | ORAL | 0 refills | Status: DC

## 2022-06-16 NOTE — Discharge Summary (Signed)
Physician Discharge Summary  [name] CWC:[id number] DOB: 02-Aug-1965 DOA: 06/14/2022  PCP: [name], MD  Admit date: 06/14/2022 Discharge date: 06/16/2022 Recommendations for Outpatient Follow-up:  Follow up with PCP and w/ cardiology in 1 weeks-call for appointment Please obtain BMP/CBC in one week  Discharge Dispo: Home Discharge Condition: Stable Code Status:   Code Status: Full Code Diet recommendation:  Diet Order             Diet heart healthy/carb modified Room service appropriate? Yes; Fluid consistency: Thin  Diet effective now                 Brief/Interim Summary:  57 y.o.m w/ chronic HFrEF (EF 30 to 35%), ischemic cardiomyopathy, status post ICD, hypertension, type 2 diabetes, GERD recent hospitalization 11/25-12/9 for V. tach arrest and VT storm in the setting of hypokalemia and underwent upgrade of atrial lead placement,LHC showed nonobstructive CAD, placed on amiodarone and metoprolol. He was also treated for cellulitis of buttocks, gout, diarrhea, AKI, and hyponatremia presented to the ED t 06/14/22 2/2 shortness of breath, chest pain, and palpitations, feeling very anxious and having frequent panic attacks after his hospitalization last month and has been started on Ativan and Prozac to help with his anxiety.  Has had short bursts of dyspnea lasting seconds intermittently, and resolving spontaneously.  States this has been happening for the past 5+ years but has gotten worse.States his primary care provider had deferred to cardiology for this problem. Seen in the ED EKG sinus rhythm, labs with creatinine elevated 2.62.6> 7 BNP 33 stable CBC blood sugar treated.  Chest x-ray enlarged cardiac silhouette lungs clear Patient was admitted, cardiology consulted Had mild bump in creatinine but improved on recheck, Complains of sudden need to take a deep breath though not overtly short of breath sometimes ordered but other times before getting out of bed   Discharge  Diagnoses:  Principal Problem:   AKI (acute kidney injury) ([place]) Active Problems:   Anxiety state   Chest pain   Nonischemic cardiomyopathy ([place])   ICD (implantable cardioverter-defibrillator) in place   Insulin dependent type 2 diabetes mellitus ([place])   Dyspnea   History of ventricular tachycardia   Essential hypertension  Dyspnea Chest pain Palpitation ?Anxiety episodes: So far workup unremarkable EKG stable routine labs with elevated imbalance infection anemia.  Chest x-ray clear.  BNP normal serial troponin negative x 2.  Suspecting most of his symptoms are related to his severe anxiety which is understandable given his history.  He has a psychology appointment already scheduled soon.  Continue on his as needed Ativan, added Atarax.  ICD interrogation per cardiology.  Again no overt evidence of CHF on exam and chest x-ray.  Chronic systolic CHF Nonischemic cardiomyopathy EF 30%: Volume status stable BNP normal not in heart failure followed by cardiology continue home meds  AKI on ckd 3b improved to baseline Recent Labs    05/14/22 0130 05/15/22 0213 05/16/22 0150 05/17/22 0109 05/23/22 1200 05/26/22 1125 05/28/22 1800 06/14/22 1814 06/15/22 1830 06/16/22 0924  BUN 34* 30* 31* 30* 19 18 25* 19 25* 23*  CREATININE 1.81* 1.78* 1.85* 1.85* 2.02* 2.12* 2.22* 2.65* 2.33* 2.07*    History of VT/VF ICD in situ: Cardiology consulted interrogation done. Continue amiodarone but decrease to 200 daily Consults: Cardiology  Subjective: Alert awake oriented resting comfortably denies shortness of breath on room air.   Discharge Exam: Vitals:   06/15/22 2059 06/16/22 0927  BP: 139/89 135/63  Pulse: 63  63  Resp: 18 16  Temp: 97.6 F (36.4 C) 97.6 F (36.4 C)  SpO2: 95% 95%   General: Pt is alert, awake, not in acute distress Cardiovascular: RRR, S1/S2 +, no rubs, no gallops Respiratory: CTA bilaterally, no wheezing, no rhonchi Abdominal: Soft, NT, ND, bowel sounds  + Extremities: no edema, no cyanosis  Discharge Instructions  Discharge Instructions     Ambulatory Referral for Lung Cancer Scre   Complete by: As directed    Discharge instructions   Complete by: As directed    Please call call MD or return to ER for similar or worsening recurring problem that brought you to hospital or if any fever,nausea/vomiting,abdominal pain, uncontrolled pain, chest pain,  shortness of breath or any other alarming symptoms.  Please follow-up your doctor as instructed in a week time and call the office for appointment.  Please avoid alcohol, smoking, or any other illicit substance and maintain healthy habits including taking your regular medications as prescribed.  You were cared for by a hospitalist during your hospital stay. If you have any questions about your discharge medications or the care you received while you were in the hospital after you are discharged, you can call the unit and ask to speak with the hospitalist on call if the hospitalist that took care of you is not available.  Once you are discharged, your primary care physician will handle any further medical issues. Please note that NO REFILLS for any discharge medications will be authorized once you are discharged, as it is imperative that you return to your primary care physician (or establish a relationship with a primary care physician if you do not have one) for your aftercare needs so that they can reassess your need for medications and monitor your lab values   Increase activity slowly   Complete by: As directed       Allergies as of 06/16/2022       Reactions   Bee Venom Anaphylaxis   Trulicity [dulaglutide] Diarrhea   Patient experience onset of diarrhea with using this medication - cleared after med stopped.        Medication List     TAKE these medications    Accu-Chek Guide test strip Generic drug: glucose blood Use as instructed   Accu-Chek Guide w/Device Kit UAD    accu-chek softclix lancets 1 each by Other route 3 (three) times daily.   Accu-Chek Softclix Lancets lancets Use as instructed   albuterol 108 (90 Base) MCG/ACT inhaler Commonly known as: VENTOLIN HFA Inhale into the lungs every 6 (six) hours as needed for wheezing or shortness of breath.   amiodarone 200 MG tablet Commonly known as: PACERONE Take 1 tablet (200 mg total) by mouth daily. Start taking on: June 17, 2022 What changed:  medication strength how much to take   aspirin 81 MG tablet Take 81 mg by mouth daily.   atorvastatin 10 MG tablet Commonly known as: LIPITOR Take 1 tablet (10 mg total) by mouth daily.   cetirizine 10 MG tablet Commonly known as: ZYRTEC Take 1 tablet (10 mg total) by mouth daily.   cholecalciferol 25 MCG (1000 UNIT) tablet Commonly known as: VITAMIN D3 Take 1,000 Units by mouth daily.   clobetasol ointment 0.05 % Commonly known as: TEMOVATE Apply 1 Application topically 2 (two) times daily. What changed:  when to take this reasons to take this   dapagliflozin propanediol 10 MG Tabs tablet Commonly known as: Farxiga Take 1 tablet (10 mg total)  by mouth daily before breakfast.   Entresto 24-26 MG Generic drug: sacubitril-valsartan Take 1 tablet by mouth 2 (two) times daily.   EPINEPHrine 0.3 mg/0.3 mL Soaj injection Commonly known as: EPI-PEN Inject 0.3 mg into the muscle as needed.   famotidine 20 MG tablet Commonly known as: PEPCID Take 1 tablet by mouth 1-2 times a day as needed. What changed:  how much to take how to take this when to take this reasons to take this additional instructions   FLUoxetine 10 MG capsule Commonly known as: PROzac Take 1 capsule (10 mg total) by mouth daily.   fluticasone 50 MCG/ACT nasal spray Commonly known as: FLONASE Use 1-2 sprays in each nostril once a day as needed for nasal congestion.   FreeStyle Libre 2 Sensor Misc Use to check blood sugar TID. Change sensor once every 14  days. E11.65   furosemide 80 MG tablet Commonly known as: LASIX Take 1 tablet (80 mg total) by mouth daily as needed for edema or fluid.   gabapentin 300 MG capsule Commonly known as: NEURONTIN TAKE 1 CAPSULE BY MOUTH EVERY DAY AT BEDTIME What changed: See the new instructions.   hydrOXYzine 10 MG tablet Commonly known as: ATARAX Take 1 tablet (10 mg total) by mouth 3 (three) times daily as needed for anxiety.   ipratropium-albuterol 0.5-2.5 (3) MG/3ML Soln Commonly known as: DUONEB Take 3 mLs by nebulization every 6 (six) hours as needed.   Lantus SoloStar 100 UNIT/ML Solostar Pen Generic drug: insulin glargine Inject 30 Units into the skin daily.   LORazepam 0.5 MG tablet Commonly known as: ATIVAN Take 1 tablet (0.5 mg total) by mouth 2 (two) times daily as needed for anxiety.   methocarbamol 500 MG tablet Commonly known as: ROBAXIN TAKE 1 TABLET BY MOUTH ONCE DAILY AS NEEDED FOR MUSCLE SPASM What changed:  how much to take how to take this when to take this reasons to take this additional instructions   metoprolol succinate 50 MG 24 hr tablet Commonly known as: TOPROL-XL Take 1 tablet (50 mg total) by mouth daily.   NovoLOG FlexPen 100 UNIT/ML FlexPen Generic drug: insulin aspart Inject 10 Units into the skin 2 (two) times daily before a meal.   Pen Needles 31G X 8 MM Misc UAD   potassium chloride SA 20 MEQ tablet Commonly known as: KLOR-CON M Take 1 tablet (20 mEq total) by mouth 2 (two) times daily.   sildenafil 100 MG tablet Commonly known as: VIAGRA Take 1-2 tabs PO 1/2-1 hr prior to intercourse.   spironolactone 25 MG tablet Commonly known as: ALDACTONE Take 0.5 tablets (12.5 mg total) by mouth daily.   tamsulosin 0.4 MG Caps capsule Commonly known as: FLOMAX Take 2 capsules (0.8 mg total) by mouth daily.   triamcinolone cream 0.1 % Commonly known as: KENALOG Apply sparingly to red itchy areas twice daily as needed. Avoid face, neck, armpits  or groin area. Do not use more than 3 weeks in a row.        Follow-up Information     [name], MD Follow up in 1 week(s).   Specialty: Internal Medicine Contact information: [address] [place] [address] [phone]         [name], MD Follow up in 1 week(s).   Specialty: Internal Medicine Contact information: [address] [place] [address] [phone]  Allergies  Allergen Reactions   Bee Venom Anaphylaxis   Trulicity [Dulaglutide] Diarrhea    Patient experience onset of diarrhea with using this medication - cleared after med stopped.    The results of significant diagnostics from this hospitalization (including imaging, microbiology, ancillary and laboratory) are listed below for reference.    Microbiology: No results found for this or any previous visit (from the past 240 hour(s)).  Procedures/Studies: DG Chest 2 View  Result Date: 06/14/2022 CLINICAL DATA:  chest pain EXAM: CHEST - 2 VIEW COMPARISON:  05/28/2022 FINDINGS: Cardiac silhouette enlarged. No evidence of pneumothorax or pleural effusion. No evidence of pulmonary edema. No osseous abnormalities identified. There is a left-sided pacer. IMPRESSION: Enlarged cardiac silhouette.  Lungs are clear. Electronically Signed   By: Sammie Bench M.D.   On: 06/14/2022 19:12   DG Chest 2 View  Result Date: 05/28/2022 CLINICAL DATA:  Chest pain EXAM: CHEST - 2 VIEW COMPARISON:  Chest x-ray 05/16/2022 FINDINGS: Left-sided pacemaker is unchanged in position. The heart is enlarged, unchanged from prior. There is no focal lung consolidation, pleural effusion or pneumothorax. There central pulmonary vascular congestion. No acute fractures are seen. IMPRESSION: Cardiomegaly with central pulmonary vascular congestion. Electronically Signed   By: Ronney Asters M.D.   On: 05/28/2022 17:43   CUP PACEART INCLINIC DEVICE CHECK  Result Date:  05/23/2022 ICD check in clinic. Normal device function. Thresholds and sensing consistent with previous device measurements. Impedance trends stable over time. No mode switches. No ventricular arrhythmias. Histogram distribution appropriate for patient and level of  activity. No changes made this session. Device programmed at appropriate safety margins. Device programmed to optimize intrinsic conduction. Estimated longevity _7.7-8.8 years__. Pt enrolled in remote follow-up.   Labs: BNP (last 3 results) Recent Labs    05/06/22 1736 05/26/22 1125 06/14/22 2300  BNP 487.7* 18.9 31.5   Basic Metabolic Panel: Recent Labs  Lab 06/14/22 1814 06/15/22 1830 06/16/22 0924  NA 136 137 138  K 4.4 4.1 4.1  CL 100 102 103  CO2 21* 24 26  GLUCOSE 310* 199* 186*  BUN 19 25* 23*  CREATININE 2.65* 2.33* 2.07*  CALCIUM 9.0 9.1 9.0  MG  --  2.3  --    Liver Function Tests: No results for input(s): "AST", "ALT", "ALKPHOS", "BILITOT", "PROT", "ALBUMIN" in the last 168 hours. No results for input(s): "LIPASE", "AMYLASE" in the last 168 hours. No results for input(s): "AMMONIA" in the last 168 hours. CBC: Recent Labs  Lab 06/14/22 1820  WBC 8.3  NEUTROABS 5.0  HGB 13.6  HCT 41.0  MCV 90.1  PLT 335   Cardiac Enzymes: No results for input(s): "CKTOTAL", "CKMB", "CKMBINDEX", "TROPONINI" in the last 168 hours. BNP: Invalid input(s): "POCBNP" CBG: Recent Labs  Lab 06/15/22 1212 06/15/22 1713 06/15/22 2057 06/16/22 0834 06/16/22 1214  GLUCAP 163* 175* 184* 174* 148*   D-Dimer No results for input(s): "DDIMER" in the last 72 hours. Hgb A1c No results for input(s): "HGBA1C" in the last 72 hours. Lipid Profile No results for input(s): "CHOL", "HDL", "LDLCALC", "TRIG", "CHOLHDL", "LDLDIRECT" in the last 72 hours. Thyroid function studies No results for input(s): "TSH", "T4TOTAL", "T3FREE", "THYROIDAB" in the last 72 hours.  Invalid input(s): "FREET3" Anemia work up No results for  input(s): "VITAMINB12", "FOLATE", "FERRITIN", "TIBC", "IRON", "RETICCTPCT" in the last 72 hours. Urinalysis    Component Value Date/Time   COLORURINE STRAW (A) 02/13/2022 1637   APPEARANCEUR CLEAR 02/13/2022 1637   APPEARANCEUR Clear 07/17/2020 1655  LABSPEC 1.015 02/13/2022 1637   PHURINE 5.0 02/13/2022 1637   GLUCOSEU >=500 (A) 02/13/2022 1637   HGBUR NEGATIVE 02/13/2022 1637   BILIRUBINUR NEGATIVE 02/13/2022 1637   BILIRUBINUR negative 01/30/2022 1705   BILIRUBINUR Negative 07/17/2020 1655   KETONESUR NEGATIVE 02/13/2022 1637   PROTEINUR NEGATIVE 02/13/2022 1637   UROBILINOGEN 0.2 01/30/2022 1705   UROBILINOGEN 1.0 06/20/2013 1032   NITRITE NEGATIVE 02/13/2022 1637   LEUKOCYTESUR NEGATIVE 02/13/2022 1637   Sepsis Labs Recent Labs  Lab 06/14/22 1820  WBC 8.3   Microbiology No results found for this or any previous visit (from the past 240 hour(s)).   Time coordinating discharge: 25 minutes  SIGNED: Antonieta Pert, MD  [facility] 06/16/2022, 1:23 PM  If 7PM-7AM, please contact night-coverage [email or web address]

## 2022-06-16 NOTE — Hospital Course (Addendum)
57 y.o.m w/ chronic HFrEF (EF 30 to 35%), ischemic cardiomyopathy, status post ICD, hypertension, type 2 diabetes, GERD recent hospitalization 11/25-12/9 for V. tach arrest and VT storm in the setting of hypokalemia and underwent upgrade of atrial lead placement,LHC showed nonobstructive CAD, placed on amiodarone and metoprolol. He was also treated for cellulitis of buttocks, gout, diarrhea, AKI, and hyponatremia presented to the ED t 06/14/22 2/2 shortness of breath, chest pain, and palpitations, feeling very anxious and having frequent panic attacks after his hospitalization last month and has been started on Ativan and Prozac to help with his anxiety.  Has had short bursts of dyspnea lasting seconds intermittently, and resolving spontaneously.  States this has been happening for the past 5+ years but has gotten worse.States his primary care provider had deferred to cardiology for this problem. Seen in the ED EKG sinus rhythm, labs with creatinine elevated 2.62.6> 7 BNP 33 stable CBC blood sugar treated.  Chest x-ray enlarged cardiac silhouette lungs clear Patient was admitted, cardiology consulted Had mild bump in creatinine but improved on recheck, Complains of sudden need to take a deep breath though not overtly short of breath sometimes ordered but other times before getting out of bed

## 2022-06-16 NOTE — Progress Notes (Signed)
Rounding Note    Patient Name: [name] Date of Encounter: 06/16/2022  [place] Cardiologist: [name], MD   Subjective   No CP, no active SOB, describes that even prior to the most recent hospitalization and VT storm, he was having the same symptoms, but in the 3 or so days leading to this stay were increasing in frequency and worrisome.  Described most often in the AMs before getting out of bed, but other times as well, a sudden need to take a deep breath, though not overtly SOB otherwise  Inpatient Medications    Scheduled Meds:  amiodarone  200 mg Oral Daily   aspirin  81 mg Oral Daily   atorvastatin  10 mg Oral Daily   enoxaparin (LOVENOX) injection  40 mg Subcutaneous Q24H   FLUoxetine  10 mg Oral Daily   gabapentin  300 mg Oral QHS   insulin aspart  0-15 Units Subcutaneous TID WC   insulin aspart  0-5 Units Subcutaneous QHS   insulin aspart  10 Units Subcutaneous BID AC   insulin glargine-yfgn  30 Units Subcutaneous QHS   metoprolol succinate  50 mg Oral Daily   tamsulosin  0.8 mg Oral Daily   Continuous Infusions:  PRN Meds: acetaminophen **OR** acetaminophen, LORazepam   Vital Signs    Vitals:   06/15/22 1814 06/15/22 1826 06/15/22 2059 06/16/22 0927  BP: (!) 146/71  139/89 135/63  Pulse: 90  63 63  Resp: 20  18 16   Temp: 98.2 F (36.8 C)  97.6 F (36.4 C) 97.6 F (36.4 C)  TempSrc: Oral  Oral Oral  SpO2: 95% 95% 95% 95%  Weight:      Height:       No intake or output data in the 24 hours ending 06/16/22 1108    06/14/2022    7:02 PM 05/28/2022    4:57 PM 05/26/2022   10:32 AM  Last 3 Weights  Weight (lbs) 253 lb 254 lb 13.6 oz 254 lb 12.8 oz  Weight (kg) 114.76 kg 115.6 kg 115.577 kg      Telemetry    SR/AP V sensed - Personally Reviewed  ECG    No new EKGs - Personally Reviewed  Physical Exam   GEN: No acute distress.   Neck: No JVD Cardiac: RRR, no murmurs, rubs, or gallops.  Respiratory: CTA b/l. GI:  Soft, nontender, non-distended  MS: No edema; No deformity. Neuro:  Nonfocal  Psych: Normal affect   Labs    High Sensitivity Troponin:   Recent Labs  Lab 05/28/22 1800 06/14/22 1814 06/14/22 2300  TROPONINIHS 5 6 7      Chemistry Recent Labs  Lab 06/14/22 1814 06/15/22 1830 06/16/22 0924  NA 136 137 138  K 4.4 4.1 4.1  CL 100 102 103  CO2 21* 24 26  GLUCOSE 310* 199* 186*  BUN 19 25* 23*  CREATININE 2.65* 2.33* 2.07*  CALCIUM 9.0 9.1 9.0  MG  --  2.3  --   GFRNONAA 27* 32* 37*  ANIONGAP 15 11 9     Lipids No results for input(s): "CHOL", "TRIG", "HDL", "LABVLDL", "LDLCALC", "CHOLHDL" in the last 168 hours.  Hematology Recent Labs  Lab 06/14/22 1820  WBC 8.3  RBC 4.55  HGB 13.6  HCT 41.0  MCV 90.1  MCH 29.9  MCHC 33.2  RDW 14.3  PLT 335   Thyroid No results for input(s): "TSH", "FREET4" in the last 168 hours.  BNP Recent Labs  Lab  06/14/22 2300  BNP 33.3    DDimer No results for input(s): "DDIMER" in the last 168 hours.   Radiology    DG Chest 2 View  Result Date: 06/14/2022 CLINICAL DATA:  chest pain EXAM: CHEST - 2 VIEW COMPARISON:  05/28/2022 FINDINGS: Cardiac silhouette enlarged. No evidence of pneumothorax or pleural effusion. No evidence of pulmonary edema. No osseous abnormalities identified. There is a left-sided pacer. IMPRESSION: Enlarged cardiac silhouette.  Lungs are clear. Electronically Signed   By: Sammie Bench M.D.   On: 06/14/2022 19:12    Cardiac Studies   05/12/22: R/LHC HEMODYNAMICS: RA:                  1 mmHg (mean) RV:                  22/1-3 mmHg PA:                  23/6 mmHg (12 mean) PCWP:            4 mmHg (mean)                                      Estimated Fick CO/CI   7 L/min, 2.9 L/min/m2                                                 TPG                 8  mmHg                                              PVR                 ~1 Wood Units  PAPi                >5       IMPRESSION: Low pre and post  capillary filling pressures.  Normal cardiac output/cardiac index.  Normal PVR & PA mean No obstructive CAD, left dominant w/ large Lcx supplying multiple large marginals.      05/06/22: TTE 1. Left ventricular ejection fraction, by estimation, is 30 to 35%. The  left ventricle has moderately decreased function. The left ventricle  demonstrates global hypokinesis. The left ventricular internal cavity size  was moderately dilated. There is mild   eccentric left ventricular hypertrophy. Left ventricular diastolic  parameters are consistent with Grade I diastolic dysfunction (impaired  relaxation).   2. Right ventricular systolic function is normal. The right ventricular  size is normal. Tricuspid regurgitation signal is inadequate for assessing  PA pressure.   3. Left atrial size was moderately dilated.   4. The mitral valve is normal in structure. No evidence of mitral valve  regurgitation.   5. The aortic valve is tricuspid. Aortic valve regurgitation is not  visualized. No aortic stenosis is present.   6. Aortic dilatation noted. There is borderline dilatation of the aortic  root, measuring 38 mm.   7. The inferior vena cava is normal in size with greater than 50%  respiratory variability, suggesting right atrial pressure of 3 mmHg.  Comparison(s): No significant change from prior study. Prior images  reviewed side by side.     Patient Profile     57 y.o. male w/PMHx of NICM/chronic systolic CHF s/p ICD, normal coronary arteries by cath 2015, GERD, HTN and DM2, VT/VF.  History of: Admitted 05/03/22 with VT storm, 22 ICD shocks and 2 ATP, 10 aborted shocks  R/LHC looked good, volume stable, no obstructive CAD, LVEF stable 30-35% Underwent device upgrade to a dual chamber device 05/15/22 by Dr. Quentin Ore Discharged 05/17/22  Device information Abbott single chamber ICD implanted 12/23/2013 >> upgraded to dual chamber devce ( A lead added) 05/15/22   + appropriate tx, VT storm    AAD hx Amiodarone started Nov 2023  He was admitted this admission 06/14/21 with symptoms of SOB felt most likely to be 2/2 anxiety     Assessment & Plan    VT (by history) Amiodarone dose reduced this admission 200mg  daily None on telemetry  NICM Chronic CHF Does not appear volume OL by Xray or exam Unclear etiology of his symptom   Anxiety Perhaps post VT storm, multiple ICD shock PTSD (?) vs panic attacks Was started on prozac recently out pt Has been referred to Dr. Michail Sermon, pending his visit    For questions or updates, please contact [place] Please consult [email or web address] for contact info under        Signed, Baldwin [place], PA-C  06/16/2022, 11:08 AM

## 2022-06-17 ENCOUNTER — Ambulatory Visit: Payer: Self-pay | Admitting: *Deleted

## 2022-06-17 ENCOUNTER — Ambulatory Visit (HOSPITAL_COMMUNITY)
Admission: EM | Admit: 2022-06-17 | Discharge: 2022-06-17 | Disposition: A | Discharge status: Home / Self Care | Payer: Medicaid Other | Attending: Registered Nurse | Admitting: Registered Nurse

## 2022-06-17 ENCOUNTER — Ambulatory Visit: Payer: Medicaid Other | Attending: Internal Medicine | Admitting: Internal Medicine

## 2022-06-17 ENCOUNTER — Telehealth: Payer: Self-pay

## 2022-06-17 ENCOUNTER — Encounter: Payer: Self-pay | Admitting: Internal Medicine

## 2022-06-17 ENCOUNTER — Encounter (HOSPITAL_COMMUNITY): Payer: Self-pay | Admitting: Registered Nurse

## 2022-06-17 VITALS — BP 147/96 | HR 65 | Temp 98.0°F | Ht 72.0 in | Wt 251.0 lb

## 2022-06-17 DIAGNOSIS — F410 Panic disorder [episodic paroxysmal anxiety] without agoraphobia: Secondary | ICD-10-CM | POA: Insufficient documentation

## 2022-06-17 DIAGNOSIS — F411 Generalized anxiety disorder: Secondary | ICD-10-CM | POA: Insufficient documentation

## 2022-06-17 DIAGNOSIS — R45851 Suicidal ideations: Secondary | ICD-10-CM | POA: Insufficient documentation

## 2022-06-17 MED ORDER — LORAZEPAM 0.5 MG PO TABS: 0.5000 mg | ORAL_TABLET | Freq: Two times a day (BID) | ORAL | 0 refills | Status: DC | PRN

## 2022-06-17 NOTE — Patient Instructions (Addendum)
I have sent refill on Ativan to [facility] as you requested.  Please be seen in the emergency room after leaving here for psychiatric evaluation because of the suicidal thoughts.  Please take the firearm out of your house and have someone else keep it whom you trust. I will submit a referral to get you in with a psychiatrist as soon as possible to assist [place] with medication management.

## 2022-06-17 NOTE — Telephone Encounter (Signed)
Transition Care Management Follow-up Telephone Call Date of discharge and from where: 06/16/2022, [facility]. The patient has an appointment this afternoon at [facility] with Dr Wynetta Emery.

## 2022-06-17 NOTE — Discharge Instructions (Addendum)
OBS Care Management   A list of resources for outpatient therapy and psychiatry is provided below to get you started back on treatment.  It is imperative that you follow through with treatment within 5-7 days from the day of discharge to prevent any further risk to your safety or mental well-being.  You are not limited to the list provided.  In case of an urgent crisis, you may contact the Mobile Crisis Unit with [facility] at [phone].  Outpatient Services for Therapy and Medication Management for [facility] [place], [place], [address] [phone] phone  New Patient Assessment/Therapy Walk-ins Monday and Wednesday: 8am until slots are full. Every 1st and 2nd Friday: 1pm - 5pm  NO ASSESSMENT/THERAPY WALK-INS ON [place]  New Patient Psychiatry/Medication Management Walk-ins Monday-Friday: 8am-11am  For all walk-ins, we ask that you arrive by 7:30am because patient will be seen in the order of arrival.  Availability is limited; therefore, you may not be seen on the same day that you walk-in.  Our goal is to serve and meet the needs of our community to the best of our ability.   [facility] [address] [place], [place], [address] [phone] phone  [facility], [place], [place], [address] [phone] phone [phone] phone ([address], AmeriHealth, Anthem/Elevance, BCBS, [place], [place], ComPsych, Healthy [place], [place], [place], [place], [place], [employer] Company, [place], Out of Network)  [employer], [address] [place], [place], [address] [phone] phone ([place], Anthem/Elevance, [employer] Options/Carelon, [place], [place], [place], [place], [place], [employer], [place], [place], [place], [facility] [place])  [employer] [address] [place], [place], [address] [phone] phone (Medicaid, ask about other insurance)  [facility] [address], [place], [place], [address] [phone] phone [phone] fax ([address] [place] , [place], [place], [place], UHC, [employer] Pride, Self-Pay)  Evangeline [facility]in [address] [place], [place], [address] [phone] phone ([address], Anthem/Elevance, [place], [place], [place], [place], [place], [employer], [place], [place], [place], [facility])  [place] - 6-8 MONTH WAIT FOR THERAPY; SOONER FOR MEDICATION MANAGEMENT [address] [place], [place], [address] [phone] phone ([address] [place], [place], [place], [place], Friday Health Plans, [place], [place], [place], [place], [place], [place], Tricare, UHC, [employer], [place])  [facility] [address], [place], [place], [address] [phone] phone  [facility] [address], [place], [place], [address] [phone] phone  [facility] [address] [place], [place], [address] [phone] phone  [facility] [address] [place], [address] [phone] phone  [facility], [place] [address], [place], [address] [phone] phone  [facility] [place] [place], [place], [address] [phone] phone [phone] fax  [facility] [address] [place], [place], [address] [phone] phone [phone] fax  [facility] [phone] [address], [place], [address] [phone] phone  Jinny Blossom [address] [place], [place], [address]  [phone] phone  [facility]  (Adults Only) [address] [place], [place],  [address]  [phone] phone [phone] fax     To schedule an appointment for counseling call number listed below.  If unable to wait for appointment you may come during walk in hours listed below.    [facility]: Outpatient psychiatric Services  New Patient Assessment and Therapy Walk-in Monday thru Thursday 8:00 am first come first serve until slots are full Every Friday from 1:00 pm to 4:00 pm first come first serve until slots are full  New Patient Psychiatric Medication Management Monday thru Friday from 8:00 am to 11:00 am first come first served until slots are full  For all walk-ins we ask that you arrive by 7:15 am because patients will be seen in there order of arrival.   Availability is limited, and therefore you may not be seen on the same day that you walk in.  Our goal is to serve and meet the needs of our community to the best of our ability.     Safety Plan [name] will reach out to his family (brother or sister), call 58 or call mobile crisis, or go to nearest emergency room if condition worsens or if suicidal thoughts become active Patients' will follow up with Dr. Elissa Lovett for medication management on 06/26/2022 and will follow up with resources given for therapy.   The suicide prevention education provided includes the following: Suicide risk factors Suicide prevention and interventions National Suicide Hotline telephone number [facility] assessment telephone number [facility] Emergency Assistance [place] and/or Residential Mobile Crisis Unit telephone number Request made of family/significant other to:  [name] and his brother [name] (e.g., guns, rifles, knives), all items previously/currently identified as safety concern.   Remove drugs/medications (over the counter, prescriptions, illicit drugs), all items previously/currently identified as a safety concern.   Mobile Crisis Response  Teams Listed by counties in vicinity of [place]. [phone] [place] [phone] [place] [phone] [facility] [phone] [place] [phone]                * [place] [phone]  [place]. [phone] [place].  [place] [phone]  Suicide Resources  Who to Call Call [place] at [phone]; [phone]  More Resources Suicide Awareness Voices of Education       [phone]        www.save.[place] on Mental Illness(NAMI)       (800) 950-NAMI        www.nami.org American Association of Suicidology       [phone]        www.suicidology.org

## 2022-06-17 NOTE — Progress Notes (Signed)
   06/17/22 1704  [place] (Walk-ins at [facility] only)  How Did You Hear About [place]? Self  What Is the Reason for Your Visit/Call Today? SI, Panic Attacks; Pt [facility] to [facility] voluntarily and unaccompaned at this tim with complaints of SI with a plan to use a gun to shoot himself in the face.  Pt deneis HI or AVH/Drg/alcohol.  Pt reports that he is having panic attacks due to heart pounds relate to his fibrillation.  Pt admits to prior MH diagnosis or precribed medicaton for symptm management.  How Long Has This Been Causing You Problems? <Week  Have You Recently Had Any Thoughts About Hurting Yourself? Yes  How long ago did you have thoughts about hurting yourself? 24 hours  Are You Planning to Commit Suicide/Harm Yourself At This time? No  Have you Recently Had Thoughts About [place]? No  Are You Planning To Harm Someone At This Time? No  Are you currently experiencing any auditory, visual or other hallucinations? No  Have You Used Any Alcohol or Drugs in the Past 24 Hours? No  Do you have any current medical co-morbidities that require immediate attention? No  Clinician description of patient physical appearance/behavior: Casual, engaged, anxious  What Do You Feel Would Help You the Most Today? Treatment for Depression or other mood problem;Stress Management  If access to [facility] was not available, would you have sought care in the Emergency Department? Yes  Determination of Need Urgent (48 hours)  Options For Referral [facility]

## 2022-06-17 NOTE — Telephone Encounter (Signed)
Pt concerned about side effects of Prozac   Pt requests to speak with a nurse to discuss side effects of FLUoxetine (PROZAC) 10 MG capsule. Pt requests call back at [phone]     Chief Complaint: Anxiety Symptoms: Shaking, anxious "Woke up that way." States released from hospital yesterday 'AKI'. Initially calling questioning side effects of Prozac, on med x 5 weeks. States "Having thoughts I never had before" When questioned, "Like hurting myself but I would never do that. " No plan. States took ativan prior to call, "Helping some." Brother is with pt, states good support group Frequency: This AM Pertinent Negatives: Patient denies  Disposition: [] ED /[] Urgent Care (no appt availability in office) / [x] Appointment(In office/virtual)/ []  [facility]/ [] Home Care/ [] Refused Recommended Disposition /[] [facility]/ []  Follow-up with PCP Additional Notes: Appt secured for today. Care advise provided,gave resource numbers [place], 426 national pt verbalizes understanding.   Reason for Disposition  Patient sounds very upset or troubled to the triager  Answer Assessment - Initial Assessment Questions 1. CONCERN: "Did anything happen that prompted you to call today?"      Got out of hospital yesterday, sob 2. ANXIETY SYMPTOMS: "Can you describe how you (your loved one; patient) have been feeling?" (e.g., tense, restless, panicky, anxious, keyed up, overwhelmed, sense of impending doom).      Very nervous, shaky 3. ONSET: "How long have you been feeling this way?" (e.g., hours, days, weeks)     This morning 4. SEVERITY: "How would you rate the level of anxiety?" (e.g., 0 - 10; or mild, moderate, severe).     *No Answer* 5. FUNCTIONAL IMPAIRMENT: "How have these feelings affected your ability to do daily activities?" "Have you had more difficulty than usual doing your normal daily activities?" (e.g., getting better, same, worse; self-care, school, work, interactions)     *No  Answer* 6. HISTORY: "Have you felt this way before?" "Have you ever been diagnosed with an anxiety problem in the past?" (e.g., generalized anxiety disorder, panic attacks, PTSD). If Yes, ask: "How was this problem treated?" (e.g., medicines, counseling, etc.)     *No Answer* 7. RISK OF HARM - SUICIDAL IDEATION: "Do you ever have thoughts of hurting or killing yourself?" If Yes, ask:  "Do you have these feelings now?" "Do you have a plan on how you would do this?"     Yes but would never do that 8. TREATMENT:  "What has been done so far to treat this anxiety?" (e.g., medicines, relaxation strategies). "What has helped?"     Ativan 9. TREATMENT - THERAPIST: "Do you have a counselor or therapist? Name?"     No 10. POTENTIAL TRIGGERS: "Do you drink caffeinated beverages (e.g., coffee, colas, teas), and how much daily?" "Do you drink alcohol or use any drugs?" "Have you started any new medicines recently?"       no 11. PATIENT SUPPORT: "Who is with you now?" "Who do you live with?" "Do you have family or friends who you can talk to?"        YEs, brother with me 62. OTHER SYMPTOMS: "Do you have any other symptoms?" (e.g., feeling depressed, trouble concentrating, trouble sleeping, trouble breathing, palpitations or fast heartbeat, chest pain, sweating, nausea, or diarrhea)       No. On Prozac x 5 weeks  Protocols used: Anxiety and Panic Attack-A-AH

## 2022-06-17 NOTE — Progress Notes (Signed)
Patient ID: [name], male    DOB: Jun 24, 1965  MRN: [id number]  CC: Anxiety (Anxiety f/u. Orion Crook prozac - suicidal thoughts today, trembling. /Concerns for Atarax due to heart condition/Requesting a referral to another surgeon that accepts medicaid. )   Subjective: [name] is a 57 y.o. 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   Patient was hospitalized 1/ 6-01/2023 for dyspnea, CP, palpitations.  Cardiac workup did not reveal any overt decompensated CHF.  Symptoms thought to be related to severe anxiety.  Told to continue Ativan and Atarax was added.  Patient states he does not plan to take the Atarax because he has read that it can affect heart rate.  Has an appointment later this month with a psychologist.  Main concern today is suicidal ideation and continued panic attacks.  When I last spoke with him 05/30/2022, patient was about to start Prozac and Ativan 0.5 mg twice a day was added for frequent panic attacks.   He tells me that he was doing okay on the Prozac and felt it was helping.  He thinks he has been on it about 5 weeks but I see the prescription was written on 18 December.  He was still having panic attacks but they were not as frequent once he started the Prozac.  He was taking the Ativan 0.5 mg half a tablet twice a day and some days not at all.  However recently he started taking the full tablet twice a day.  He states that any and everything can set off the anxiety/panic attack.  For example if his brother calls and states that he is going to visit a relative, he gets anxious and starts thinking that something may happen to his brother while he is driving. -Woke up this morning and felt suicidal. -He heard a voice telling him to kill  himself. -He started having vivid visions of him killing himself with a gun.  He does have a firearm in the house.  He lives alone.  He was very troubled by the thought, the voice and the vision so he called our office and called his brother who came over and stayed with him.  He was trembling.  He has never had thoughts like this before. -He felt like he was having a nervous breakdown.  He took an Ativan and was able to calm down.  He takes the Ativan, he has a warm sensation that comes over him and then feels better after about 15 minutes. -Does not have dreams of being in the hospital or being shocked multiple times.  However he states that any body sensation that he has like acid reflux, he becomes afraid and thinks that his defibrillator will fire again.  His defibrillator has not fired since he left the hospital in early December.  Patient states that his whole life is change from having the dose of Trulicity increased in November by a member of our clinical staff team who he thinks was a Marine scientist but was a Nurse, adult. -he felt that he should of heard from her with an apology.  Only person he heard from was me.  Patient Active Problem List   Diagnosis Date Noted   Panic attack 06/17/2022   Passive suicidal ideations 06/17/2022  History of ventricular tachycardia 06/14/2022   Essential hypertension 06/14/2022   Stage 3b chronic kidney disease ([place]) 05/15/2022   Acute gout due to renal impairment involving left ankle 05/07/2022   Acute gout due to renal impairment involving right knee 05/07/2022   Hyponatremia 05/07/2022   Diarrhea 05/06/2022   Hyperglycemia 05/06/2022   Cellulitis of buttock 05/06/2022   Chronic HFrEF (heart failure with reduced ejection fraction) ([place]) 05/06/2022   Acute renal failure superimposed on stage 3b chronic kidney disease ([place]) 05/06/2022   Hypokalemia 05/05/2022   Rectal abscess 05/05/2022   Left buttock abscess 05/05/2022   Cardiac arrest ([place])  05/03/2022   AKI (acute kidney injury) ([place]) 05/03/2022   V-tach ([place]) 05/03/2022   CKD (chronic kidney disease) stage 2, GFR 60-89 ml/min 10/24/2019   Acute non-recurrent frontal sinusitis 06/27/2019   COVID-19 virus infection 06/27/2019   Glaucoma suspect 12/22/2018   Seasonal and perennial allergic rhinoconjunctivitis 10/25/2018   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   Urticaria 05/03/2018   S/P inguinal hernia repair 03/01/2018   Unilateral inguinal hernia without obstruction or gangrene 07/24/2017   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   Hypersomnia 02/28/2016   Abnormal PFT 02/24/2016   Dyspnea 01/28/2016   Diabetic polyneuropathy associated with type 2 diabetes mellitus ([place]) 12/18/2015   Lumbar back pain 12/18/2015   Insulin dependent type 2 diabetes mellitus ([place]) 09/17/2015   Left median nerve neuropathy 04/16/2015   Lesion of right median nerve at forearm 04/16/2015   ICD (implantable cardioverter-defibrillator) in place 82/42/3536   Chronic systolic congestive heart failure ([place]) 03/28/2014   Nonischemic cardiomyopathy ([place]) 11/09/2013   Syncope 10/20/2013   Chest pain 07/28/2013   RENAL CALCULUS 05/13/2009   Obesity 05/08/2009   Former heavy cigarette smoker (20-39 per day) 05/08/2009   HOARSENESS 05/08/2009   GAD (generalized anxiety disorder) 02/28/2008   ASTHMATIC BRONCHITIS, ACUTE 02/28/2008   GERD 02/28/2008   IRRITABLE BOWEL SYNDROME 02/28/2008     Current Outpatient Medications on File Prior to Visit  Medication Sig Dispense Refill   Accu-Chek Softclix Lancets lancets Use as instructed 100 each 12   albuterol (VENTOLIN HFA) 108 (90 Base) MCG/ACT inhaler Inhale into the lungs every 6 (six) hours as needed for wheezing or shortness of breath.     amiodarone (PACERONE) 200 MG tablet Take 1 tablet (200 mg total) by mouth daily. 30 tablet 0   aspirin 81 MG tablet Take 81 mg by mouth daily.     atorvastatin (LIPITOR) 10 MG  tablet Take 1 tablet (10 mg total) by mouth daily. 90 tablet 1   Blood Glucose Monitoring Suppl (ACCU-CHEK GUIDE) w/Device KIT UAD 1 kit 0   cetirizine (ZYRTEC) 10 MG tablet Take 1 tablet (10 mg total) by mouth daily. 90 tablet 1   cholecalciferol (VITAMIN D3) 25 MCG (1000 UNIT) tablet Take 1,000 Units by mouth daily.     clobetasol ointment (TEMOVATE) 1.44 % Apply 1 Application topically 2 (two) times daily. (Patient taking differently: Apply 1 Application topically daily as needed (for eczema).) 30 g 3   Continuous Blood Gluc Sensor (FREESTYLE LIBRE 2 SENSOR) MISC Use to check blood sugar TID. Change sensor once every 14 days. E11.65 2 each 3   dapagliflozin propanediol (FARXIGA) 10 MG TABS tablet Take 1 tablet (10 mg total) by mouth daily before breakfast. 90 tablet 3   EPINEPHrine 0.3 mg/0.3 mL IJ SOAJ injection Inject 0.3 mg into the muscle as needed. 1 each 1   famotidine (PEPCID) 20  MG tablet Take 1 tablet by mouth 1-2 times a day as needed. (Patient taking differently: Take 20 mg by mouth 2 (two) times daily as needed for heartburn or indigestion.) 90 tablet 1   FLUoxetine (PROZAC) 10 MG capsule Take 1 capsule (10 mg total) by mouth daily. 60 capsule 3   fluticasone (FLONASE) 50 MCG/ACT nasal spray Use 1-2 sprays in each nostril once a day as needed for nasal congestion. 16 g 5   furosemide (LASIX) 80 MG tablet Take 1 tablet (80 mg total) by mouth daily as needed for edema or fluid. 180 tablet 1   gabapentin (NEURONTIN) 300 MG capsule TAKE 1 CAPSULE BY MOUTH EVERY DAY AT BEDTIME (Patient taking differently: Take 300 mg by mouth at bedtime.) 90 capsule 1   glucose blood (ACCU-CHEK GUIDE) test strip Use as instructed 100 each 12   insulin aspart (NOVOLOG FLEXPEN) 100 UNIT/ML FlexPen Inject 10 Units into the skin 2 (two) times daily before a meal. 15 mL 2   insulin glargine (LANTUS SOLOSTAR) 100 UNIT/ML Solostar Pen Inject 30 Units into the skin daily. 15 mL 1   Insulin Pen Needle (PEN NEEDLES)  31G X 8 MM MISC UAD 100 each 6   ipratropium-albuterol (DUONEB) 0.5-2.5 (3) MG/3ML SOLN Take 3 mLs by nebulization every 6 (six) hours as needed. 360 mL 2   Lancet Devices (ACCU-CHEK SOFTCLIX) lancets 1 each by Other route 3 (three) times daily. 1 each 0   methocarbamol (ROBAXIN) 500 MG tablet TAKE 1 TABLET BY MOUTH ONCE DAILY AS NEEDED FOR MUSCLE SPASM (Patient taking differently: Take 500 mg by mouth daily as needed for muscle spasms.) 30 tablet 0   metoprolol succinate (TOPROL-XL) 50 MG 24 hr tablet Take 1 tablet (50 mg total) by mouth daily. 90 tablet 3   potassium chloride SA (KLOR-CON M) 20 MEQ tablet Take 1 tablet (20 mEq total) by mouth 2 (two) times daily. 60 tablet 5   sacubitril-valsartan (ENTRESTO) 24-26 MG Take 1 tablet by mouth 2 (two) times daily. 180 tablet 1   sildenafil (VIAGRA) 100 MG tablet Take 1-2 tabs PO 1/2-1 hr prior to intercourse. 20 tablet 4   spironolactone (ALDACTONE) 25 MG tablet Take 0.5 tablets (12.5 mg total) by mouth daily. 30 tablet 5   tamsulosin (FLOMAX) 0.4 MG CAPS capsule Take 2 capsules (0.8 mg total) by mouth daily. 180 capsule 1   triamcinolone cream (KENALOG) 0.1 % Apply sparingly to red itchy areas twice daily as needed. Avoid face, neck, armpits or groin area. Do not use more than 3 weeks in a row. 45 g 5   No current facility-administered medications on file prior to visit.    Allergies  Allergen Reactions   Bee Venom Anaphylaxis   Trulicity [Dulaglutide] Diarrhea    Extra dose of Trulicity caused cardiac arrest. Patient experience onset of diarrhea with using this medication - cleared after med stopped.    Social History   Socioeconomic History   Marital status: Single    Spouse name: Not on file   Number of children: Not on file   Years of education: Not on file   Highest education level: Not on file  Occupational History   Occupation: Disabled  Tobacco Use   Smoking status: Former    Packs/day: 1.00    Years: 28.00    Total pack  years: 28.00    Types: Cigarettes    Quit date: 06/03/2014    Years since quitting: 8.0   Smokeless tobacco: Never  Vaping Use   Vaping Use: Never used  Substance and Sexual Activity   Alcohol use: Yes   Drug use: Yes    Types: Marijuana    Comment: occasionally   Sexual activity: Yes  Other Topics Concern   Not on file  Social History Narrative   Pt lives with his brother    Social Determinants of Health   Financial Resource Strain: High Risk (05/08/2022)   Overall Financial Resource Strain (CARDIA)    Difficulty of Paying Living Expenses: Very hard  Food Insecurity: No Food Insecurity (05/14/2022)   Hunger Vital Sign    Worried About Running Out of Food in the Last Year: Never true    Ran Out of Food in the Last Year: Never true  Transportation Needs: No Transportation Needs (05/06/2022)   PRAPARE - Hydrologist (Medical): No    Lack of Transportation (Non-Medical): No  Physical Activity: Not on file  Stress: Not on file  Social Connections: Moderately Integrated (03/12/2021)   Social Connection and Isolation Panel [NHANES]    Frequency of Communication with Friends and Family: More than three times a week    Frequency of Social Gatherings with Friends and Family: More than three times a week    Attends Religious Services: More than 4 times per year    Active Member of [employer] or Organizations: Yes    Attends Archivist Meetings: Never    Marital Status: Never married  Intimate Partner Violence: Not At Risk (05/06/2022)   Humiliation, Afraid, Rape, and Kick questionnaire    Fear of Current or Ex-Partner: No    Emotionally Abused: No    Physically Abused: No    Sexually Abused: No    Family History  Problem Relation Age of Onset   Diabetes Father    Arthritis Father    Hypertension Father    Diabetes Mother    Arthritis Mother    Hypertension Mother    Hypertension Brother     Past Surgical History:  Procedure Laterality  Date   CARDIAC CATHETERIZATION  08/10/13   CHOLECYSTECTOMY  ~ 2010   IMPLANTABLE CARDIOVERTER DEFIBRILLATOR IMPLANT  12/23/2013   STJ Fortify ICD implanted by [name] for cardiomyopathy and syncope   IMPLANTABLE CARDIOVERTER DEFIBRILLATOR IMPLANT N/A 12/23/2013   Procedure: IMPLANTABLE CARDIOVERTER DEFIBRILLATOR IMPLANT;  Surgeon: Coralyn Mark, MD;  Location: [facility] CATH LAB;  Service: Cardiovascular;  Laterality: N/A;   INGUINAL HERNIA REPAIR Left 03/01/2018   Procedure: OPEN REPAIR OF LEFT INGUINAL HERNIA WITH MESH;  Surgeon: Greer Pickerel, MD;  Location: [facility];  Service: General;  Laterality: Left;   LEAD REVISION/REPAIR N/A 05/15/2022   Procedure: LEAD REVISION/REPAIR;  Surgeon: Vickie Epley, MD;  Location: [facility] CV LAB;  Service: Cardiovascular;  Laterality: N/A;   LEFT HEART CATHETERIZATION WITH CORONARY ANGIOGRAM N/A 08/10/2013   Procedure: LEFT HEART CATHETERIZATION WITH CORONARY ANGIOGRAM;  Surgeon: Jettie Booze, MD;  Location[facility] CATH LAB;  Service: Cardiovascular;  Laterality: N/A;   ORIF HUMERUS FRACTURE Left 08/12/2013   Procedure: OPEN REDUCTION INTERNAL FIXATION (ORIF) HUMERAL SHAFT FRACTURE;  Surgeon: Newt Minion, MD;  Location: [facility];  Service: Orthopedics;  Laterality: Left;  Open Reduction Internal Fixation Left Humerus   RIGHT/LEFT HEART CATH AND CORONARY ANGIOGRAPHY N/A 05/12/2022   Procedure: RIGHT/LEFT HEART CATH AND CORONARY ANGIOGRAPHY;  Surgeon: Hebert Soho, DO;  Location: [facility] CV LAB;  Service: Cardiovascular;  Laterality: N/A;   URETEROSCOPY     "  laser for kidney stones"    ROS: Review of Systems Negative except as stated above  PHYSICAL EXAM: BP (!) 147/96 (BP Location: Left Arm, Patient Position: Sitting, Cuff Size: Normal)   Pulse 65   Temp 98 F (36.7 C) (Oral)   Ht 6' (1.829 m)   Wt 251 lb (113.9 kg)   SpO2 95%   BMI 34.04 kg/m   Physical Exam   General appearance - alert, well appearing, older African-American male and in  no distress Mental status -patient tearful at times.    06/17/2022    3:40 PM 03/04/2022    4:44 PM 03/19/2021    4:10 PM  Depression screen PHQ 2/9  Decreased Interest 1 1 1   Down, Depressed, Hopeless 2 1 1   PHQ - 2 Score 3 2 2   Altered sleeping 1 1 1   Tired, decreased energy 0 1 1  Change in appetite 0 1 1  Feeling bad or failure about yourself  0 0 0  Trouble concentrating 0 0 0  Moving slowly or fidgety/restless 0 0 1  Suicidal thoughts 1 0 0  PHQ-9 Score 5 5 6       06/17/2022    3:40 PM 03/04/2022    4:44 PM 03/19/2021    4:10 PM 04/09/2020    3:42 PM  GAD 7 : Generalized Anxiety Score  Nervous, Anxious, on Edge 3 1 0 1  Control/stop worrying 3 1 1 1   Worry too much - different things 3 1 1 1   Trouble relaxing 2 1 1  0  Restless 1 0 0 0  Easily annoyed or irritable 1 1 0 1  Afraid - awful might happen 3 0 0 0  Total GAD 7 Score 16 5 3 4         Latest Ref Rng & Units 06/16/2022    9:24 AM 06/15/2022    6:30 PM 06/14/2022    6:14 PM  CMP  Glucose 70 - 99 mg/dL 186  199  310   BUN 6 - 20 mg/dL 23  25  19    Creatinine 0.61 - 1.24 mg/dL 2.07  2.33  2.65   Sodium 135 - 145 mmol/L 138  137  136   Potassium 3.5 - 5.1 mmol/L 4.1  4.1  4.4   Chloride 98 - 111 mmol/L 103  102  100   CO2 22 - 32 mmol/L 26  24  21    Calcium 8.9 - 10.3 mg/dL 9.0  9.1  9.0    Lipid Panel     Component Value Date/Time   CHOL 178 08/26/2021 1608   TRIG 336 (H) 08/26/2021 1608   HDL 42 08/26/2021 1608   CHOLHDL 4.2 08/26/2021 1608   CHOLHDL 4 05/08/2009 1040   VLDL 27.0 05/08/2009 1040   LDLCALC 81 08/26/2021 1608    CBC    Component Value Date/Time   WBC 8.3 06/14/2022 1820   RBC 4.55 06/14/2022 1820   HGB 13.6 06/14/2022 1820   HGB 14.0 08/26/2021 1608   HCT 41.0 06/14/2022 1820   HCT 40.9 08/26/2021 1608   PLT 335 06/14/2022 1820   PLT 286 08/26/2021 1608   MCV 90.1 06/14/2022 1820   MCV 88 08/26/2021 1608   MCH 29.9 06/14/2022 1820   MCHC 33.2 06/14/2022 1820   RDW 14.3  06/14/2022 1820   RDW 13.0 08/26/2021 1608   LYMPHSABS 2.6 06/14/2022 1820   LYMPHSABS 1.7 06/28/2018 1440   MONOABS 0.5 06/14/2022 1820   EOSABS 0.1 06/14/2022  1820   EOSABS 0.3 06/28/2018 1440   BASOSABS 0.1 06/14/2022 1820   BASOSABS 0.0 06/28/2018 1440    ASSESSMENT AND PLAN:  1. Suicidal ideation 2. Panic disorder -I empathized with him for what he is going through and the chain of events that he feels caused it.  However, I reassured him that none of [place] would have predicted the reaction that he had to the increase dose of Trulicity and would never do anything intentionally to cause physical or mental harm to him. -Informed him that Prozac and other medications used to treat anxiety/depression can cause suicidal thoughts.  I recommend being seen at behavioral health emergency room for a psychiatric evaluation and their thoughts on whether we need to stop the Prozac. -Recommend referral to psychiatry to help [place] with medication management.  He should keep upcoming appointment with the psychologist. -Advised that he remove the firearm out of his home and give it to a trusted friend or family member to keep for him until he is in a better place mentally. Patient is agreeable with the plan. He is needing refill on Ativan.  He tells me he only has 4 left. [place] controlled substance reporting system reviewed. Refill sent on Ativan to his pharmacy. - LORazepam (ATIVAN) 0.5 MG tablet; Take 1 tablet (0.5 mg total) by mouth 2 (two) times daily as needed for anxiety.  Dispense: 60 tablet; Refill: 0   Patient was given the opportunity to ask questions.  Patient verbalized understanding of the plan and was able to repeat key elements of the plan.   This documentation was completed using Radio producer.  Any transcriptional errors are unintentional.  No orders of the defined types were placed in this encounter.    Requested Prescriptions   Signed Prescriptions  Disp Refills   LORazepam (ATIVAN) 0.5 MG tablet 60 tablet 0    Sig: Take 1 tablet (0.5 mg total) by mouth 2 (two) times daily as needed for anxiety.    Return in about 3 weeks (around 07/08/2022).  Karle Plumber, MD, FACP

## 2022-06-17 NOTE — ED Provider Notes (Signed)
Behavioral Health Urgent Care Medical Screening Exam  Patient Name: [name] MRN: [id number] Date of Evaluation: 06/17/22 Chief Complaint:   Diagnosis:  Final diagnoses:  GAD (generalized anxiety disorder)  Panic attack  Passive suicidal ideations    History of Present illness: [name] is a 57 y.o. male patient presented to [facility] as a walk in accompanied by His brother Manual Chiara with complaints of worsening anxiety and panic attacks  [name], 57 y.o., male patient seen face to face by this provider, consulted with Dr. Hampton Abbot; and chart reviewed on 06/17/22.  On evaluation [name] reports "A month ago I thought he gave me a double dose of tonicity which caused me to go into cardiac arrest. My defibrillator went off 23 times in one day. After that was discharged from the hospital my cardiologist started me on Prozac in [place].  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 This morning I woke up and everything was fine but I was like OK it's getting ready to happen because I've been having this feeling all day every day that my defibrillator is going to go off and when it goes off it feels like someone is stabbing you in the chest."  Patient state that he really doesn't want to die and he doesn't have a plan that there was on a vision of him shooting himself during the panic attack.  Patient states that there is a gun in the home, but his brother is able to remove from home.  Patient is able to contract for safety.  Patient states that he lives with his brother who is very supportive.  States that he has a sister that is also supportive.  Patient states that he has an appointment coming up for medication management but would also like therapy. Patient denies history of self-harm, prior suicide attempt, and psychiatric hospitalization.  Patient gave permission to speak to his brother Manual Corvin who was present.  Manual states that he is able to work from home and  will be around the patient.  States that he has already removed the gun from home where patient doesn't have access to it.  States that he feels that patient will be fine coming home, just want to make sure that he is going to have the services, medications needed to get better.   During evaluation [name] is sitting up right in chair with no noted distress.  He is alert/oriented x 4; calm, cooperative, and attentive.  His mood is anxious and dysphoric with congruent with affect.  He is speaking in a clear tone at moderate volume, and normal pace; with good eye contact.  His thought process is coherent, relevant, and there is no indication that he is currently responding to internal/external stimuli or experiencing delusional thought content.  Currently he denies suicidal/self-harm/homicidal ideation, psychosis, and paranoia.  Reporting only one episode of seeing himself shoot himself during a panic attack.   He contracts for safety.  Safety plan established with patient and his brother.    At this time LANDO [name] is educated and verbalizes understanding of mental health resources and other crisis services in the community. He is instructed to call 911 and present to the nearest emergency room should he experience any suicidal/homicidal ideation, auditory/visual/hallucinations, or detrimental worsening of his mental health condition.  He was given resources for outpatient counseling services and was also informed he could call the toll-free phone on back of  Medicaid card to speak with care coordinator  Safety Plan [name] will reach out to his family (brother or sister), call 41 or call mobile crisis, or go to nearest emergency room if condition worsens or if suicidal thoughts become active Patients' will follow up with Dr. Elissa Lovett for medication management on 06/26/2022 and will follow up with resources given for therapy.   The suicide prevention education provided includes the  following: Suicide risk factors Suicide prevention and interventions National Suicide Hotline telephone number [facility] assessment telephone number [facility] Emergency Assistance [place] and/or Residential Mobile Crisis Unit telephone number Request made of family/significant other to:  Lonzie and his brother [name] (e.g., guns, rifles, knives), all items previously/currently identified as safety concern.   Remove drugs/medications (over the counter, prescriptions, illicit drugs), all items previously/currently identified as a safety concern.    [facility] from 06/17/2022 in [facility] Most recent reading at 06/17/2022  5:06 PM ED from 05/28/2022 in [facility] Most recent reading at 05/28/2022  4:58 PM ED from 05/28/2022 in [facility] Most recent reading at 05/28/2022  3:28 PM  C-SSRS RISK CATEGORY High Risk No Risk No Risk       Psychiatric Specialty Exam  Presentation  General Appearance:Appropriate for Environment; Casual; Well Groomed  Eye Contact:Good  Speech:Clear and Coherent; Normal Rate  Speech Volume:Normal  Handedness:Right   Mood and Affect  Mood: Anxious; Dysphoric  Affect: Congruent   Thought Process  Thought Processes: Coherent; Goal Directed  Descriptions of Associations:Intact  Orientation:Full (Time, Place and Person)  Thought Content:Logical    Hallucinations:None  Ideas of Reference:None  Suicidal Thoughts:Yes, Passive Without Intent (Patient reporting he had a thought of shooting himself today during a panic attack.  States first time it has ever happened.  Denies having intent and actual plan to kill himself)  Homicidal Thoughts:No   Sensorium  Memory: Immediate Good; Recent Good; Remote Good  Judgment: Intact  Insight: Present   Executive Functions  Concentration: Good  Attention  Span: Good  Recall: Good  Fund of Knowledge: Good  Language: Good   Psychomotor Activity  Psychomotor Activity: Normal   Assets  Assets: Communication Skills; Desire for Improvement; Financial Resources/Insurance; Housing; Leisure Time; Resilience; Social Support   Sleep  Sleep: Fair  Number of hours: No data recorded  Nutritional Assessment (For OBS and FBC admissions only) Has the patient had a weight loss or gain of 10 pounds or more in the last 3 months?: No Has the patient had a decrease in food intake/or appetite?: No Does the patient have dental problems?: No Does the patient have eating habits or behaviors that may be indicators of an eating disorder including binging or inducing vomiting?: No Has the patient recently lost weight without trying?: 0 Has the patient been eating poorly because of a decreased appetite?: 0 Malnutrition Screening Tool Score: 0    Physical Exam: Physical Exam Vitals and nursing note reviewed.  Constitutional:      General: He is not in acute distress.    Appearance: Normal appearance. He is not ill-appearing.  HENT:     Head: Normocephalic.  Eyes:     Conjunctiva/sclera: Conjunctivae normal.  Cardiovascular:     Rate and Rhythm: Normal rate.  Pulmonary:     Effort: Pulmonary effort is normal.  Musculoskeletal:        General: Normal range of motion.     Cervical  back: Normal range of motion.  Skin:    General: Skin is warm and dry.  Neurological:     Mental Status: He is alert and oriented to person, place, and time.  Psychiatric:        Attention and Perception: Attention and perception normal. He does not perceive auditory or visual hallucinations.        Mood and Affect: Mood is anxious.        Speech: Speech normal.        Behavior: Behavior normal. Behavior is cooperative.        Thought Content: Thought content is not paranoid or delusional. Thought content does not include homicidal or suicidal ideation.         Cognition and Memory: Cognition and memory normal.        Judgment: Judgment normal.    Review of Systems  Constitutional:  Negative for chills and malaise/fatigue.  Respiratory:  Positive for shortness of breath ("I'm always short of breath). Negative for cough and wheezing.   Cardiovascular:  Positive for chest pain and palpitations.       Reporting recent cardiac issues, recent hospitalization, also Implantable cardioverter-defibrillators (ICD)  Gastrointestinal:  Positive for heartburn. Negative for vomiting.  Genitourinary:  Negative for dysuria and frequency.  Musculoskeletal: Negative.   Skin: Negative.   Neurological:  Negative for dizziness and seizures.  Psychiatric/Behavioral:  Negative for hallucinations and substance abuse (Reporing a passive suicidal thought today during a panic attack.  States he doesn't want to die and he doesn't want to kill himself). Depression: dysphoric.The patient is nervous/anxious.        Patient reporting panic attacks since his ICD went off 23 times in one day.  Now is is anxious that it is going to go off at any time, which is causing worsening anxiety and panic attacks.     Blood pressure 135/85, pulse 68, resp. rate 18, SpO2 97 %. There is no height or weight on file to calculate BMI.  Musculoskeletal: Strength & Muscle Tone: within normal limits Gait & Station: normal Patient leans: N/A   [place] MSE Discharge Disposition for Follow up and Recommendations: Based on my evaluation the patient does not appear to have an emergency medical condition and can be discharged with resources and follow up care in outpatient services for Medication Management, Individual Therapy, and Group Therapy   Follow-up [facility].   Specialty: Behavioral Health Why: Keep scheduled appointment for medication management on 06/26/2022 at 1:00 PM with Dr. Charlette Caffey Contact information: [place] [phone]        Call  [place].   Specialty: Professional Counselor Why: schedule an appointment for medication management and therapy Contact information: [facility] [address] [phone]                  Discharge Instructions      OBS Care Management   A list of resources for outpatient therapy and psychiatry is provided below to get you started back on treatment.  It is imperative that you follow through with treatment within 5-7 days from the day of discharge to prevent any further risk to your safety or mental well-being.  You are not limited to the list provided.  In case of an urgent crisis, you may contact the Mobile Crisis Unit with [facility] at [phone].  Outpatient Services for Therapy and Medication Management for [facility] [place], [place], [address] [phone] phone  New Patient Assessment/Therapy Walk-ins Monday and Wednesday: 8am until slots are full. Every 1st and 2nd Friday: 1pm - 5pm  NO ASSESSMENT/THERAPY WALK-INS ON [place]  New Patient Psychiatry/Medication Management Walk-ins Monday-Friday: 8am-11am  For all walk-ins, we ask that you arrive by 7:30am because patient will be seen in the order of arrival.  Availability is limited; therefore, you may not be seen on the same day that you walk-in.  Our goal is to serve and meet the needs of our community to the best of our ability.   [facility] [address] [place], [place], [address] [phone] phone  [facility], [place], [place], [address] [phone] phone [phone] phone ([address] AmeriHealth, Anthem/Elevance, BCBS, [place], [place], ComPsych, Healthy [place], [place], [place], [place], [place], Southern Company, [place], Out of Network)  [employer], [address] [place], [place], [address] [phone] phone ([place], Anthem/Elevance, [employer] Options/Carelon, [place], [place], [place], [place], [place], [employer], [place], [place], [place], [facility])  [place] [address] [place], [place], [address] [phone] phone (Medicaid, ask about other insurance)  [facility] [address], [place], [place], [address] [phone] phone [phone] fax ([address] [place] , [place], [place], [place], UHC, Pilgrim[employer] Pride, Self-Pay)  Evang[facility] Dakin [place]. [place], [place], [address] [phone] phone ([address] Anthem/Elevance, [place], [place], [place], [place], [place], [employer], [place], [place], [place], [facility])  [facility] [facility] - 6-8 MONTH WAIT FOR THERAPY; [facility] FOR MEDICATION MANAGEMENT [address], [place] , [place], [address] [phone] phone ([address] [place], [place], [place], [place], Friday Health Plans, [place], [place], [place], [place], [place], [place], Tricare, UHC, [employer], [place])  [facility] [address] [place], [place], [address] [phone] phone  [facility] [address], [place], [place], [address] [phone] phone  [facility] [address], [place], [place], [address] [phone] phone  [facility] [address], [place], [address] [phone] phone  [facility], [place] [address], [place], [address] [phone] phone  [facility] [place] [place], [place], [address] [phone]  phone [phone] fax  [facility] [address] [place], [place], [address] [phone] phone [phone] fax  [facility] [id number] [place], [place], [address] [phone] phone  [facility] [address] [place], [place], [address]  [phone] phone  [facility]  (Adults Only) [address] [place], [place], [address]  [phone] phone [phone] fax     To schedule an appointment for counseling call number listed below.  If unable to wait for appointment you may come during walk in hours listed below.    [facility]: Outpatient psychiatric Services  New Patient Assessment and Therapy Walk-in Monday thru Thursday 8:00 am first come first serve until slots are full Every Friday from 1:00 pm to 4:00 pm first come first serve until slots are full  New Patient Psychiatric Medication Management Monday thru Friday from 8:00 am to 11:00 am first  come first served until slots are full  For all walk-ins we ask that you arrive by 7:15 am because patients will be seen in there order of arrival.   Availability is limited, and therefore you may not be seen on the same day that you walk in.  Our goal is to serve and meet the needs of our community to the best of our ability.     Safety Plan DAVIONTE LUSBY will reach out to his family (brother or sister), call 57 or call mobile crisis, or go to nearest emergency room if condition worsens or if suicidal thoughts become active Patients' will follow up with Dr. Elissa Lovett for medication management on 06/26/2022 and will follow up with resources given for therapy.   The suicide prevention education provided includes the following: Suicide risk factors Suicide prevention and interventions National Suicide Hotline telephone number [facility] assessment telephone number [facility] Emergency Assistance [place] and/or  Residential Mobile Crisis Unit telephone number Request made of family/significant other to:  Ilijah and his brother [name] (e.g., guns, rifles, knives), all items previously/currently identified as safety concern.   Remove drugs/medications (over the counter, prescriptions, illicit drugs), all items previously/currently identified as a safety concern.   Mobile Crisis Response Teams Listed by counties in vicinity of [place]. [phone] [place] [phone] [place] [phone] [facility] [phone] [place] [phone]                * [place] [phone]  [place]. [phone] [place].  [place] [phone]  Suicide Resources  Who to Call Call [place] at [phone]; [phone]  More Resources Suicide Awareness Voices of Education       [phone]        www.save.[place] on Mental Illness(NAMI)       (800) 950-NAMI        www.nami.org American Association of Suicidology       [phone]        www.suicidology.org       Harlowe Dowler, NP 06/17/2022, 5:51 PM

## 2022-06-18 ENCOUNTER — Encounter: Payer: Medicaid Other | Admitting: Student

## 2022-06-18 ENCOUNTER — Telehealth: Payer: Self-pay | Admitting: Internal Medicine

## 2022-06-18 DIAGNOSIS — I5022 Chronic systolic (congestive) heart failure: Secondary | ICD-10-CM

## 2022-06-18 DIAGNOSIS — F419 Anxiety disorder, unspecified: Secondary | ICD-10-CM

## 2022-06-18 DIAGNOSIS — I472 Ventricular tachycardia, unspecified: Secondary | ICD-10-CM

## 2022-06-18 NOTE — Telephone Encounter (Signed)
Phone call placed to patient this afternoon.  I inquired how he was doing.  He tells me he was doing a lot better today. I told him that I touch base with the nurse practitioner who saw him last night at the behavioral health urgent care to inquire whether or not we should stop the Prozac.  She has consulted with her attending physician Dr. Dwyane Dee who recommended that we stop the Prozac until he sees psychiatry next week.  I also confirmed with our referral coordinator Dr. Charlette Caffey is a psychiatrist not a psychologist so he would be able to do medication management.  Patient expressed understanding and thanked me for calling.

## 2022-06-18 NOTE — Telephone Encounter (Signed)
-----   Message from Consuello Closs, NP sent at 06/18/2022  4:35 PM EST ----- There is always the possibility of worsening clinical symptoms or suicidality with the start or a dose increase of antidepressant medication.  I consulted with Dr. Dwyane Dee and she recommends discontinuing the Prozac since he has an appointment with psychiatrist next week for medications management.  Prozac can be stopped without taper will take about 2-3 weeks for it to leave his system so shouldn't have any withdrawal.  Another medication can be started when he meets with psychiatrist for medication management.  Varun reported to me that he had only one episode of suicidal vision that occurred during a panic attack.  He stated that he doesn't want to kill himself and was able to contact for safety.  His brother that was present with him felt that Von was safe to go home and didn't feel that he was a danger to himself.  However, there was a gun in the home that was removed by his brother sot that Orie doesn't have access to it.  I also went over safety plan with Sidhant and his brother Manual yesterday.  Brother also stated that he was going to work from home so he Warner wouldn't be there alone, and his sister would also be checking in on him.          Safety Plan: 1. Dahl Higinbotham Suttonwill reach out to his family (brother or sister),call 66 or call mobile crisis, or go   to nearest emergency room if condition worsens or if suicidal thoughts become active. 2. He will follow up withDr. Elissa Lovett for medication management on 06/26/2022 and will follow up with resources given for therapy. 3. The suicide prevention education provided includes the following: -Suicide risk factors -Suicide prevention and interventions -National Suicide Hotline telephone number -[facility] assessment telephone number -[facility] Emergency Assistance [place] and/or Residential Mobile Crisis Unit telephone number 4. Request  made of family/significant other PI:RJJOA and his brother Audelia Hives. -Engineer, maintenance (e.g., guns, rifles, knives), all items previously/currently identified as safety concern.  -----Remove drugs/medications (over the counter, prescriptions, illicit drugs), all items previously/currently identified as a safety concern.  ----- Message ----- From: [name], MD Sent: 06/17/2022   9:58 PM EST To: Consuello Closs, NP; Hampton Abbot, MD  Good evening Dr. Dwyane Dee and NP Rankin.  I am the PCP for this pt. I sent him to [facility] this afternoon after I saw him. 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  He was started on Prozac 10 mg 05/26/2022. I added Ativan 0.5 mg BID PRN.  This a.m he reports waking up with SI and hearing a voice telling him to kill himself.  He had vivid vision of him shooting himself with his gun so he called [place] and I saw him. He has an appt later this mth with a psychologist.   My question is given the time he has been on the Prozac, should I have him stop it???  Could this have caused the SI?  I am trying to get him in with a psychiatrist for medication management.

## 2022-06-22 NOTE — Progress Notes (Incomplete)
ADVANCED HEART FAILURE CLINIC NOTE  Referring Physician: [name], MD  Primary Care: [name], MD Primary Cardiologist: [name] EP: Dr. Quentin Ore HF Cardiologist: Dr. Daniel Nones  HPI: [name] is a 57 y.o. male with HFrEF (dx 2015) w/ LHC demonstrating normal coronary anatomy s/p primary prevention ICD, CKDIIIB & T2DM. He has been followed outpatient by [name]. In 2017 he had a CPX with w/ moderate functional impairment, peak VO2 of 15, VE/VCO2 of 35. From a functional standpoint, [name] reports running 2-3 miles daily until he was diagnosed with 'long covid' 8 months ago. Since that time he has seen a sharp decline in exercise capacity. In November 2023, he was admitted for VT believed to be secondary to significant electrolyte derrangements from and underlying right gluteal abscess now s/p IV antibiotics. Throughout his admission he had recurrent device shocks for VT. He underwent RHC/LHC that demonstrated normal coronary anatomy with RHC showing normal cardiac output and low filling pressures. He subsequently underwent placement of right atrial lead with improvement in ectopy after atrial pacing. TTE during admission with LVEF of 30-35%.   [name] was last seen by Dr. Daniel Nones on 05/26/22 where he reported significant anxiety and panic attacks since discharge. He reported becoming short of breath after walking 50 feet but is unsure if this is due to underlying anxiety or heart failure.  He continued to sleep on 4-5 pillows at night because of fear of recurrent ICD shocks. Since that visit, he presented to the ED on 05/28/22 with a panic attack, 06/14/22 with AKI and again 06/17/22 after another panic attack.  Today he returns to HF clinic for pharmacist medication titration. At last visit with MD metoprolol succinate was increased to 50 mg daily, Wilder Glade was increased to 10 mg daily, and spironolactone was decreased to 12.5 mg daily for hyperkalemia.    Shortness of breath/dyspnea on exertion? {YES NO:22349}  Orthopnea/PND? {YES NO:22349} Edema? {YES NO:22349} Lightheadedness/dizziness? {YES NO:22349} Daily weights at home? {YES NO:22349} Blood pressure/heart rate monitoring at home? {YES [id number] Following low-sodium/fluid-restricted diet? {YES NO:22349}  HF Medications: Metoprolol succinate ER 50 mg daily Entresto 24/26 mg twice daily Spironolactone 12.5 mg daily Farxiga 10 mg daily Furosemide 80 mg daily as needed Potassium 20 meq twice daily  Has the patient been experiencing any side effects to the medications prescribed?  {YES NO:22349}  Does the patient have any problems obtaining medications due to transportation or finances?   {YES NO:22349}  Understanding of regimen: {excellent/good/fair/poor:19665} Understanding of indications: {excellent/good/fair/poor:19665} Potential of compliance: {excellent/good/fair/poor:19665} Patient understands to avoid NSAIDs. Patient understands to avoid decongestants.    Pertinent Lab Values 06/16/22: Serum creatinine 2.07, BUN 23, Potassium 4.1, Sodium 138, Magnesium 2.3   Vital Signs: Weight: *** (last clinic weight: ***) Blood pressure: ***  Heart rate: ***   Past Medical History:  Diagnosis Date   AICD (automatic cardioverter/defibrillator) present    Chronic bronchitis ([place])    "get it most q yr" (12/23/2013)   Chronic systolic (congestive) heart failure ([place])    Fracture of left humerus    a. 07/2013.   GERD (gastroesophageal reflux disease)    not at present time   Heart murmur    "born w/it"    History of renal calculi    Hypertension    Kidney stones    NICM (nonischemic cardiomyopathy) ([place])    a. 07/2013 Echo: EF 20-25%, diff HK, Gr2 DD, mild [name], mod dil LA/RA. 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     amiodarone (PACERONE) 200 MG tablet Take 1 tablet (200 mg total) by mouth daily. 30 tablet 0   aspirin 81 MG tablet Take 81 mg by mouth daily.     atorvastatin (LIPITOR) 10 MG tablet Take 1 tablet (10 mg total) by mouth daily. 90 tablet 1   Blood Glucose Monitoring Suppl (ACCU-CHEK GUIDE) w/Device KIT UAD 1 kit 0   cetirizine (ZYRTEC) 10 MG tablet Take 1 tablet (10 mg total) by mouth daily. 90 tablet 1   cholecalciferol (VITAMIN D3) 25 MCG (1000 UNIT) tablet Take 1,000 Units by mouth daily.     clobetasol ointment (TEMOVATE) 4.40 % Apply 1 Application topically 2 (two) times daily. (Patient taking differently: Apply 1 Application topically daily as needed (for eczema).) 30 g 3   Continuous Blood Gluc Sensor (FREESTYLE LIBRE 2 SENSOR) MISC Use to check blood sugar TID. Change sensor once every 14 days. E11.65 2 each 3   dapagliflozin propanediol (FARXIGA) 10 MG TABS tablet Take 1 tablet (10 mg total) by mouth daily before breakfast. 90 tablet 3   EPINEPHrine 0.3 mg/0.3 mL IJ SOAJ injection Inject 0.3 mg into the muscle as needed. 1 each 1   famotidine (PEPCID) 20 MG tablet Take 1 tablet by mouth 1-2 times a day as needed. (Patient taking differently: Take 20 mg by mouth 2 (two) times daily as needed for heartburn or indigestion.) 90 tablet 1   FLUoxetine (PROZAC) 10 MG capsule Take 1 capsule (10 mg total) by mouth daily. 60 capsule 3   fluticasone (FLONASE) 50 MCG/ACT nasal spray Use 1-2 sprays in each nostril once a day as needed for nasal congestion. 16 g 5   furosemide (LASIX) 80 MG tablet Take 1 tablet (80 mg total) by mouth daily as needed for edema or fluid. 180 tablet 1    gabapentin (NEURONTIN) 300 MG capsule TAKE 1 CAPSULE BY MOUTH EVERY DAY AT BEDTIME (Patient taking differently: Take 300 mg by mouth at bedtime.) 90 capsule 1   glucose blood (ACCU-CHEK GUIDE) test strip Use as instructed 100 each 12   insulin aspart (NOVOLOG FLEXPEN) 100 UNIT/ML FlexPen Inject 10 Units into the skin 2 (two) times daily before a meal. 15 mL 2   insulin glargine (LANTUS SOLOSTAR) 100 UNIT/ML Solostar Pen Inject 30 Units into the skin daily. 15 mL 1   Insulin Pen Needle (PEN NEEDLES) 31G X 8 MM MISC UAD 100 each 6   ipratropium-albuterol (DUONEB) 0.5-2.5 (3) MG/3ML SOLN Take 3 mLs by nebulization every 6 (six) hours as needed. 360 mL 2   Lancet Devices (ACCU-CHEK SOFTCLIX) lancets 1 each by Other route 3 (three) times daily. 1 each 0   LORazepam (ATIVAN) 0.5 MG tablet Take 1 tablet (0.5 mg total) by mouth 2 (two) times daily as needed for anxiety. 60 tablet 0   methocarbamol (ROBAXIN) 500 MG tablet TAKE 1 TABLET BY MOUTH ONCE DAILY AS NEEDED FOR MUSCLE SPASM (Patient taking differently: Take 500 mg by  mouth daily as needed for muscle spasms.) 30 tablet 0   metoprolol succinate (TOPROL-XL) 50 MG 24 hr tablet Take 1 tablet (50 mg total) by mouth daily. 90 tablet 3   potassium chloride SA (KLOR-CON M) 20 MEQ tablet Take 1 tablet (20 mEq total) by mouth 2 (two) times daily. 60 tablet 5   sacubitril-valsartan (ENTRESTO) 24-26 MG Take 1 tablet by mouth 2 (two) times daily. 180 tablet 1   sildenafil (VIAGRA) 100 MG tablet Take 1-2 tabs PO 1/2-1 hr prior to intercourse. 20 tablet 4   spironolactone (ALDACTONE) 25 MG tablet Take 0.5 tablets (12.5 mg total) by mouth daily. 30 tablet 5   tamsulosin (FLOMAX) 0.4 MG CAPS capsule Take 2 capsules (0.8 mg total) by mouth daily. 180 capsule 1   triamcinolone cream (KENALOG) 0.1 % Apply sparingly to red itchy areas twice daily as needed. Avoid face, neck, armpits or groin area. Do not use more than 3 weeks in a row. 45 g 5   No current  facility-administered medications for this encounter.    Allergies  Allergen Reactions   Bee Venom Anaphylaxis   Trulicity [Dulaglutide] Diarrhea    Extra dose of Trulicity caused cardiac arrest. Patient experience onset of diarrhea with using this medication - cleared after med stopped.      Social History   Socioeconomic History   Marital status: Single    Spouse name: Not on file   Number of children: Not on file   Years of education: Not on file   Highest education level: Not on file  Occupational History   Occupation: Disabled  Tobacco Use   Smoking status: Former    Packs/day: 1.00    Years: 28.00    Total pack years: 28.00    Types: Cigarettes    Quit date: 06/03/2014    Years since quitting: 8.0   Smokeless tobacco: Never  Vaping Use   Vaping Use: Never used  Substance and Sexual Activity   Alcohol use: Yes   Drug use: Yes    Types: Marijuana    Comment: occasionally   Sexual activity: Yes  Other Topics Concern   Not on file  Social History Narrative   Pt lives with his brother    Social Determinants of Health   Financial Resource Strain: High Risk (05/08/2022)   Overall Financial Resource Strain (CARDIA)    Difficulty of Paying Living Expenses: Very hard  Food Insecurity: No Food Insecurity (05/14/2022)   Hunger Vital Sign    Worried About Running Out of Food in the Last Year: Never true    [place] in the Last Year: Never true  Transportation Needs: No Transportation Needs (05/06/2022)   PRAPARE - Hydrologist (Medical): No    Lack of Transportation (Non-Medical): No  Physical Activity: Not on file  Stress: Not on file  Social Connections: Moderately Integrated (03/12/2021)   Social Connection and Isolation Panel [NHANES]    Frequency of Communication with Friends and Family: More than three times a week    Frequency of Social Gatherings with Friends and Family: More than three times a week    Attends Religious  Services: More than 4 times per year    Active Member of [employer] or Organizations: Yes    Attends Archivist Meetings: Never    Marital Status: Never married  Intimate Partner Violence: Not At Risk (05/06/2022)   Humiliation, Afraid, Rape, and Kick questionnaire    Fear  of Current or Ex-Partner: No    Emotionally Abused: No    Physically Abused: No    Sexually Abused: No      Family History  Problem Relation Age of Onset   Diabetes Father    Arthritis Father    Hypertension Father    Diabetes Mother    Arthritis Mother    Hypertension Mother    Hypertension Brother    ASSESSMENT & PLAN:  Heart failure with reduced EF, Stage C, NYHA IIB-III Etiology of HF: Nonischemic as demonstrated by coronary angiography; no family history. If he continues to have recurrent VT will consider evaluation for sarcoidosis. Device interrogation w/o arrhythmias NYHA class / AHA Stage:IIB-III, unsure if SOB is from underlying anxiety or CHF Volume status & Diuretics: Lasix 80 mg prn only; while in the hospital patient had severe orthopnea and SOB during RHC when attempting to lay flat, however, filling pressures were low. I believe these symptoms are more from anxiety than volume overload. Vasodilators:Entresto 24/26mg  BID Beta-Blocker:Increase Toprol to 50mg  TUU:[id number] spironolactone 12.5mg  daily; repeat BMET today. If K is still elevated will discontinue Cardiometabolic:Increase farxiga to 10mg  daily Devices therapies & Valvulopathies: dcICD Advanced therapies: CPX in 2017 peak VO2 of 15 AND ve/vco2 of 35 but with submaximal effort; consistent with moderate HF limitations. Advanced therapies at this time limited by CKD and social barriers. Start cardiac rehab   - Basic disease state pathophysiology, medication indication, mechanism and side effects reviewed at length with patient and he verbalized understanding  2. VT - No recurrence since November hospitalization. Remains on  amiodarone.  3. T2DM - Farxiga, insulin  - Followed by his PCP  4. CKD3B - Continue farxiga and Entresto  5. Panic attacks - Continue Prozac. Follow up with psychiatry.  6. Gluteal cellulitis  -

## 2022-06-23 ENCOUNTER — Ambulatory Visit (HOSPITAL_COMMUNITY)
Admission: RE | Admit: 2022-06-23 | Discharge: 2022-06-23 | Disposition: A | Discharge status: Home / Self Care | Payer: Medicaid Other | Source: Ambulatory Visit | Attending: Cardiology | Admitting: Cardiology

## 2022-06-23 DIAGNOSIS — Z8616 Personal history of COVID-19: Secondary | ICD-10-CM | POA: Diagnosis not present

## 2022-06-23 DIAGNOSIS — Z79899 Other long term (current) drug therapy: Secondary | ICD-10-CM | POA: Insufficient documentation

## 2022-06-23 DIAGNOSIS — N1832 Chronic kidney disease, stage 3b: Secondary | ICD-10-CM | POA: Diagnosis not present

## 2022-06-23 DIAGNOSIS — F419 Anxiety disorder, unspecified: Secondary | ICD-10-CM | POA: Insufficient documentation

## 2022-06-23 DIAGNOSIS — I519 Heart disease, unspecified: Secondary | ICD-10-CM

## 2022-06-23 DIAGNOSIS — I428 Other cardiomyopathies: Secondary | ICD-10-CM

## 2022-06-23 DIAGNOSIS — Z95810 Presence of automatic (implantable) cardiac defibrillator: Secondary | ICD-10-CM | POA: Insufficient documentation

## 2022-06-23 DIAGNOSIS — F410 Panic disorder [episodic paroxysmal anxiety] without agoraphobia: Secondary | ICD-10-CM | POA: Diagnosis not present

## 2022-06-23 DIAGNOSIS — I5020 Unspecified systolic (congestive) heart failure: Secondary | ICD-10-CM | POA: Diagnosis present

## 2022-06-23 DIAGNOSIS — E1122 Type 2 diabetes mellitus with diabetic chronic kidney disease: Secondary | ICD-10-CM | POA: Diagnosis not present

## 2022-06-23 MED ORDER — ENTRESTO 49-51 MG PO TABS: 1.0000 | ORAL_TABLET | Freq: Two times a day (BID) | ORAL | 3 refills | Status: DC

## 2022-06-23 MED ORDER — SACUBITRIL-VALSARTAN 49-51 MG PO TABS
1.0000 | ORAL_TABLET | Freq: Two times a day (BID) | ORAL | Status: DC
  Filled 2022-06-23 (×2): qty 1

## 2022-06-23 NOTE — Patient Instructions (Signed)
It was a pleasure seeing you today!  MEDICATIONS: -We are changing your medications today -Increase Entresto to 49/51 mg daily. Can take 2 tablets of 24/26 mg twice daily until gone -Call if you have questions about your medications.  LABS: -We will call you if your labs need attention.  NEXT APPOINTMENT: Return to clinic on 07/15/22 at 3pm.  In general, to take care of your heart failure: -Limit your fluid intake to 2 Liters (half-gallon) per day.   -Limit your salt intake to ideally 2-3 grams (2000-3000 mg) per day. -Weigh yourself daily and record, and bring that "weight diary" to your next appointment.  (Weight gain of 2-3 pounds in 1 day typically means fluid weight.) -The medications for your heart are to help your heart and help you live longer.   -Please contact us before stopping any of your heart medications.  Call the clinic at [phone] with questions or to reschedule future appointments.

## 2022-06-23 NOTE — Progress Notes (Signed)
ADVANCED HEART FAILURE CLINIC NOTE  Referring Physician: [name], MD  Primary Care: [name], MD Primary Cardiologist: [name] EP: Dr. Quentin Ore HF Cardiologist: Dr. Daniel Nones  HPI: [name] is a 57 y.o. male with HFrEF (dx 2015) w/ LHC demonstrating normal coronary anatomy s/p primary prevention ICD, CKDIIIB & T2DM. He has been followed outpatient by [name]. In 2017 he had a CPX with w/ moderate functional impairment, peak VO2 of 15, VE/VCO2 of 35. From a functional standpoint, [name] reports running 2-3 miles daily until he was diagnosed with 'long covid' early 2023. Since that time he has seen a sharp decline in exercise capacity. In November 2023, he was admitted for VT believed to be secondary to significant electrolyte derrangements from an underlying right gluteal abscess now s/p IV antibiotics. Throughout his admission he had recurrent device shocks for VT. He underwent RHC/LHC that demonstrated normal coronary anatomy with RHC showing normal cardiac output and low filling pressures. He subsequently underwent placement of right atrial lead with improvement in ectopy after atrial pacing. TTE during admission with LVEF of 30-35%.   [name] was last seen by Dr. Daniel Nones on 05/26/22 where he reported significant anxiety and panic attacks since discharge. He reported becoming short of breath after walking 50 feet but is unsure if this is due to underlying anxiety or heart failure.  He continued to sleep on 4-5 pillows at night because of fear of recurrent ICD shocks. At that visit, metoprolol succinate was increased to 50 mg daily, [name] was increased to 10 mg daily, and spironolactone was decreased to 12.5 mg daily for hyperkalemia. Since that visit, he presented to the ED on 05/28/22 with a panic attack, 06/14/22 with AKI and again 06/17/22 for a subsequent panic attack and noted SI for which his Prozac was discontinued and he was set up with a  psychiatrist for further medication management.  Today he returns to HF clinic for pharmacist medication titration. [name] reports shortness of breath when doing activities like walking down his driveway or taking a shower. He also states shortness of breath at rest occurs with rapid onset and offset 1-2 times per day. It has no precipitating factors and resolves within 1 minute. Has taken Lasix twice in the last week. He feels his excess volume most often in his stomach with the sensation of 'fullness.' He also reports chest tightness regularly. He has 4-5 pillow orthopnea and reports intolerance to laying flat due to fear of ICD shocks. No LEE on exam. He reports lightheadedness in the past week that occurred after taking Lasix. He does not take daily weights nor daily blood pressure readings. He reports adherence to low salt diet with fluid restrictions.    HF Medications: Metoprolol succinate 50 mg daily Entresto 24/26 mg twice daily Spironolactone 12.5 mg daily Farxiga 10 mg daily Furosemide 80 mg daily as needed    Pertinent Lab Values 06/16/22: Serum creatinine 2.07, BUN 23, Potassium 4.1, Sodium 138, Magnesium 2.3, BNP 33.3 (06/14/22)   Vital Signs: Weight: 256.8 lbs (last clinic weight: 254.8 lbs) Blood pressure:112/78 mmHg  Heart rate: 63   ASSESSMENT & PLAN:  Heart failure with reduced EF, Stage C, NYHA IIB-III Etiology of HF: Nonischemic as demonstrated by coronary angiography; no family history. Per Dr. Daniel Nones, if he continues to have recurrent VT will consider evaluation for sarcoidosis.  NYHA class / AHA Stage:IIB-III, his symptoms appear unchanged from previous visit where BNP was WNL. Symptoms of chest  tightness, orthopnea and shortness of breath appear to be more related to anxiety, while HF may be contributing to his shortness of breath with ADLs. Volume status & Diuretics: Continue Lasix 80 mg daily prn. During inpatient RHC when attempting to lay flat, he  experienced SOB, however filling pressures were low. Symptoms are most likely due to anxiety given their unusual presentation and dizziness after taking furosemide this week. Vasodilators: Increase Entresto to 49/51 mg twice daily Beta-Blocker: Continue metoprolol succinate ER 50 mg daily MRA: Continue spironolactone 12.5mg  daily. Potassium and creatinine have improved to baseline after decreasing spironolactone and stopping potassium supplementation. Cardiometabolic: Continue Farxiga 10mg  daily Devices therapies & Valvulopathies: dcICD Advanced therapies: CPX in 2017 peak VO2 of 15 AND ve/vco2 of 35 but with submaximal effort; consistent with moderate HF limitations. Advanced therapies at this time limited by CKD and social barriers. He was recommended to start cardiac rehab, but has not scheduled anything this far. He was provided with the phone number to arrange cardiac rehab.  2. VT - No recurrence since November hospitalization. Remains on amiodarone 200 mg daily.  3. T2DM - Taking Farxiga and insulin  - Followed by his PCP  4. CKD3B - Continue Farxiga and Entresto  5. Panic attacks - Prozac held by primary care pending follow up this week with psychiatry.  Will follow up in PharmD clinic on 07/15/22 at 3 pm.  Thank you for allowing pharmacy to participate in this patient's care.  Reatha Harps, PharmD PGY2 Pharmacy Resident 06/23/2022 4:49 PM  Audry Riles, PharmD, BCPS, BCCP, CPP Heart Failure Clinic Pharmacist [phone]

## 2022-06-26 ENCOUNTER — Encounter (HOSPITAL_COMMUNITY): Payer: Medicaid Other | Admitting: Psychiatry

## 2022-06-26 NOTE — Progress Notes (Incomplete)
Psychiatric Initial Adult Assessment   Patient Identification: [name] MRN:  [id number] Date of Evaluation:  06/26/2022 Referral Source: PCP Chief Complaint:  No chief complaint on file.  Visit Diagnosis:    ICD-10-CM   1. GAD (generalized anxiety disorder)  F41.1     2. Panic attack  F41.0        Assessment:  [name] is a 57 y.o. male with a history of GAD, panic disorder, nonischemic cardiomyopathy, chronic HFreF, ICD, HTN, CKD 3, and type 2 DM who presents virtually to [place] at [facility] for initial evaluation on 06/26/2022.  Patient reports ***  A number of assessments were performed during the evaluation today including PHQ-9 which they scored a *** on, GAD-7 which they scored a *** on, and [place] suicide severity screening which showed ***.  Based on these assessments patient would benefit from medication adjustment to better target their symptoms.  Plan: -  - Ativan 0.5 mg BID prn for anxiety - gabapentin 300 gm QHS managed by PCP - CMP, CBC reviewed - EKG from 06/14/22 reviewed QTC 450 - Therapy referral - Crisis resources reviewed - Follow up in  History of Present Illness:  ***   History of Present illness: [name] is a 57 y.o. male patient [facility] accompanied by His brother [name] with complaints of worsening anxiety and panic attacks   "A month ago I thought he gave me a double dose of tonicity which caused me to go into cardiac arrest. My defibrillator went off 23 times in one day. After that was discharged from the hospital my cardiologist started me on Prozac and Ativan.  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 This morning I woke up and everything was fine but I was like OK it's getting ready to happen because I've been having this feeling all day every day that my defibrillator is going to go off and when it goes off it feels like someone is stabbing you in the chest."  Patient  state that he really doesn't want to die and he doesn't have a plan that there was on a vision of him shooting himself during the panic attack.  Patient states that there is a gun in the home, but his brother is able to remove from home.  Patient was able to contract for safety.  Patient states that he lives with his brother who is very supportive.  States that he has a sister that is also supportive. Patient denies history of self-harm, prior suicide attempt, and psychiatric hospitalization.  Patient gave permission to speak to his brother [name] who was present.  [name] states that he is able to work from home and will be around the patient.  States that he has already removed the gun from home where patient doesn't have access to it.  States that he feels that patient will be fine coming home, just want to make sure that he is going to have the services, medications needed to get better.     His mood is anxious and dysphoric with congruent with affect. Reporting only one episode of seeing himself shoot himself during a panic attack.   He contracts for safety.  Safety plan established with patient and his brother.     Associated Signs/Symptoms: Depression Symptoms:  {DEPRESSION SYMPTOMS:20000} (Hypo) Manic Symptoms:  {BHH MANIC SYMPTOMS:22872} Anxiety Symptoms:  {BHH ANXIETY SYMPTOMS:22873} Psychotic Symptoms:  {BHH PSYCHOTIC SYMPTOMS:22874} PTSD  Symptoms: {BHH PTSD SYMPTOMS:22875}  Past Psychiatric History: ***  Ativan, Prozac (developed suicidal ideation)  Previous Psychotropic Medications: Yes   Substance Abuse History in the last 12 months:  {yes no:314532}  Consequences of Substance Abuse: {BHH CONSEQUENCES OF SUBSTANCE ABUSE:22880}  Past Medical History:  Past Medical History:  Diagnosis Date  . AICD (automatic cardioverter/defibrillator) present   . Chronic bronchitis ([place])    "get it most q yr" (12/23/2013)  . Chronic systolic (congestive) heart failure ([place])   .  Fracture of left humerus    a. 07/2013.  Marland Kitchen GERD (gastroesophageal reflux disease)    not at present time  . Heart murmur    "born w/it"   . History of renal calculi   . Hypertension   . Kidney stones   . NICM (nonischemic cardiomyopathy) ([place])    a. 07/2013 Echo: EF 20-25%, diff HK, Gr2 DD, mild MR, mod dil LA/RA. EF 40% 2017 echo  . Obesity   . 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 Syncope   . Type II diabetes mellitus ([place]) 2006    Past Surgical History:  Procedure Laterality Date  . CARDIAC CATHETERIZATION  08/10/13  . CHOLECYSTECTOMY  ~ 2010  . IMPLANTABLE CARDIOVERTER DEFIBRILLATOR IMPLANT  12/23/2013   STJ Fortify ICD implanted by [name] for cardiomyopathy and syncope  . IMPLANTABLE CARDIOVERTER DEFIBRILLATOR IMPLANT N/A 12/23/2013   Procedure: IMPLANTABLE CARDIOVERTER DEFIBRILLATOR IMPLANT;  Surgeon: Coralyn Mark, MD;  Location: [facility] CATH LAB;  Service: Cardiovascular;  Laterality: N/A;  . INGUINAL HERNIA REPAIR Left 03/01/2018   Procedure: OPEN REPAIR OF LEFT INGUINAL HERNIA WITH MESH;  Surgeon: Greer Pickerel, MD;  Location: [facility] ORS;  Service: General;  Laterality: Left;  . LEAD REVISION/REPAIR N/A 05/15/2022   Procedure: LEAD REVISION/REPAIR;  Surgeon: Vickie Epley, MD;  Location: [facility] CV LAB;  Service: Cardiovascular;  Laterality: N/A;  . LEFT HEART CATHETERIZATION WITH CORONARY ANGIOGRAM N/A 08/10/2013   Procedure: LEFT HEART CATHETERIZATION WITH CORONARY ANGIOGRAM;  Surgeon: Jettie Booze, MD;  Location: [facility] CATH LAB;  Service: Cardiovascular;  Laterality: N/A;  . ORIF HUMERUS FRACTURE Left 08/12/2013   Procedure: OPEN REDUCTION INTERNAL FIXATION (ORIF) HUMERAL SHAFT FRACTURE;  Surgeon: Newt Minion, MD;  Location: [facility];  Service: Orthopedics;  Laterality: Left;  Open Reduction Internal Fixation Left Humerus  . RIGHT/LEFT HEART CATH AND  CORONARY ANGIOGRAPHY N/A 05/12/2022   Procedure: RIGHT/LEFT HEART CATH AND CORONARY ANGIOGRAPHY;  Surgeon: Hebert Soho, DO;  Location: [facility] CV LAB;  Service: Cardiovascular;  Laterality: N/A;  . URETEROSCOPY     "laser for kidney stones"    Family Psychiatric History: ***  Family History:  Family History  Problem Relation Age of Onset  . Diabetes Father   . Arthritis Father   . Hypertension Father   . Diabetes Mother   . Arthritis Mother   . Hypertension Mother   . Hypertension Brother     Social History:   Social History   Socioeconomic History  . Marital status: Single    Spouse name: Not on file  . Number of children: Not on file  . Years of education: Not on file  . Highest education level: Not on file  Occupational History  . Occupation: Disabled  Tobacco Use  . Smoking status: Former    Packs/day: 1.00    Years: 28.00    Total pack years:  28.00    Types: Cigarettes    Quit date: 06/03/2014    Years since quitting: 8.0  . Smokeless tobacco: Never  Vaping Use  . Vaping Use: Never used  Substance and Sexual Activity  . Alcohol use: Yes  . Drug use: Yes    Types: Marijuana    Comment: occasionally  . Sexual activity: Yes  Other Topics Concern  . Not on file  Social History Narrative   Pt lives with his brother    Social Determinants of Health   Financial Resource Strain: High Risk (05/08/2022)   Overall Financial Resource Strain (CARDIA)   . Difficulty of Paying Living Expenses: Very hard  Food Insecurity: No Food Insecurity (05/14/2022)   Hunger Vital Sign   . Worried About Charity fundraiser in the Last Year: Never true   . Ran Out of Food in the Last Year: Never true  Transportation Needs: No Transportation Needs (05/06/2022)   PRAPARE - Transportation   . Lack of Transportation (Medical): No   . Lack of Transportation (Non-Medical): No  Physical Activity: Not on file  Stress: Not on file  Social Connections: Moderately Integrated  (03/12/2021)   Social Connection and Isolation Panel [NHANES]   . Frequency of Communication with Friends and Family: More than three times a week   . Frequency of Social Gatherings with Friends and Family: More than three times a week   . Attends Religious Services: More than 4 times per year   . Active Member of Clubs or Organizations: Yes   . Attends Archivist Meetings: Never   . Marital Status: Never married    Additional Social History: ***  Allergies:   Allergies  Allergen Reactions  . Bee Venom Anaphylaxis  . Trulicity [Dulaglutide] Diarrhea    Extra dose of Trulicity caused cardiac arrest. Patient experience onset of diarrhea with using this medication - cleared after med stopped.    Metabolic Disorder Labs: Lab Results  Component Value Date   HGBA1C 10.2 (H) 05/04/2022   MPG 246 05/04/2022   MPG 174.29 02/26/2018   No results found for: "PROLACTIN" Lab Results  Component Value Date   CHOL 178 08/26/2021   TRIG 336 (H) 08/26/2021   HDL 42 08/26/2021   CHOLHDL 4.2 08/26/2021   VLDL 27.0 05/08/2009   LDLCALC 81 08/26/2021   LDLCALC 57 10/14/2019   Lab Results  Component Value Date   TSH 1.150 05/23/2022    Therapeutic Level Labs: No results found for: "LITHIUM" No results found for: "CBMZ" No results found for: "VALPROATE"  Current Medications: Current Outpatient Medications  Medication Sig Dispense Refill  . Accu-Chek Softclix Lancets lancets Use as instructed 100 each 12  . acetaminophen (TYLENOL) 500 MG tablet Take 1,000 mg by mouth 2 (two) times daily.    Marland Kitchen albuterol (VENTOLIN HFA) 108 (90 Base) MCG/ACT inhaler Inhale into the lungs every 6 (six) hours as needed for wheezing or shortness of breath.    Marland Kitchen amiodarone (PACERONE) 200 MG tablet Take 1 tablet (200 mg total) by mouth daily. 30 tablet 0  . aspirin 81 MG tablet Take 81 mg by mouth daily.    Marland Kitchen atorvastatin (LIPITOR) 10 MG tablet Take 1 tablet (10 mg total) by mouth daily. 90 tablet 1   . Blood Glucose Monitoring Suppl (ACCU-CHEK GUIDE) w/Device KIT UAD 1 kit 0  . cetirizine (ZYRTEC) 10 MG tablet Take 1 tablet (10 mg total) by mouth daily. 90 tablet 1  . cholecalciferol (VITAMIN D3)  25 MCG (1000 UNIT) tablet Take 1,000 Units by mouth daily.    . clobetasol ointment (TEMOVATE) 9.56 % Apply 1 Application topically 2 (two) times daily. (Patient taking differently: Apply 1 Application topically daily as needed (for eczema).) 30 g 3  . Continuous Blood Gluc Sensor (FREESTYLE LIBRE 2 SENSOR) MISC Use to check blood sugar TID. Change sensor once every 14 days. E11.65 2 each 3  . dapagliflozin propanediol (FARXIGA) 10 MG TABS tablet Take 1 tablet (10 mg total) by mouth daily before breakfast. 90 tablet 3  . EPINEPHrine 0.3 mg/0.3 mL IJ SOAJ injection Inject 0.3 mg into the muscle as needed. 1 each 1  . famotidine (PEPCID) 20 MG tablet Take 1 tablet by mouth 1-2 times a day as needed. (Patient taking differently: Take 20 mg by mouth 2 (two) times daily as needed for heartburn or indigestion.) 90 tablet 1  . FLUoxetine (PROZAC) 10 MG capsule Take 1 capsule (10 mg total) by mouth daily. (Patient not taking: Reported on 06/23/2022) 60 capsule 3  . fluticasone (FLONASE) 50 MCG/ACT nasal spray Use 1-2 sprays in each nostril once a day as needed for nasal congestion. 16 g 5  . furosemide (LASIX) 80 MG tablet Take 1 tablet (80 mg total) by mouth daily as needed for edema or fluid. 180 tablet 1  . gabapentin (NEURONTIN) 300 MG capsule TAKE 1 CAPSULE BY MOUTH EVERY DAY AT BEDTIME (Patient taking differently: Take 300 mg by mouth at bedtime.) 90 capsule 1  . glucose blood (ACCU-CHEK GUIDE) test strip Use as instructed 100 each 12  . insulin aspart (NOVOLOG FLEXPEN) 100 UNIT/ML FlexPen Inject 10 Units into the skin 2 (two) times daily before a meal. 15 mL 2  . insulin glargine (LANTUS SOLOSTAR) 100 UNIT/ML Solostar Pen Inject 30 Units into the skin daily. 15 mL 1  . Insulin Pen Needle (PEN NEEDLES) 31G  X 8 MM MISC UAD 100 each 6  . ipratropium-albuterol (DUONEB) 0.5-2.5 (3) MG/3ML SOLN Take 3 mLs by nebulization every 6 (six) hours as needed. 360 mL 2  . Lancet Devices (ACCU-CHEK SOFTCLIX) lancets 1 each by Other route 3 (three) times daily. 1 each 0  . LORazepam (ATIVAN) 0.5 MG tablet Take 1 tablet (0.5 mg total) by mouth 2 (two) times daily as needed for anxiety. 60 tablet 0  . methocarbamol (ROBAXIN) 500 MG tablet TAKE 1 TABLET BY MOUTH ONCE DAILY AS NEEDED FOR MUSCLE SPASM (Patient taking differently: Take 500 mg by mouth daily as needed for muscle spasms.) 30 tablet 0  . metoprolol succinate (TOPROL-XL) 50 MG 24 hr tablet Take 1 tablet (50 mg total) by mouth daily. 90 tablet 3  . potassium chloride SA (KLOR-CON M) 20 MEQ tablet Take 1 tablet (20 mEq total) by mouth 2 (two) times daily. (Patient not taking: Reported on 06/23/2022) 60 tablet 5  . sacubitril-valsartan (ENTRESTO) 49-51 MG Take 1 tablet by mouth 2 (two) times daily. 180 tablet 3  . sildenafil (VIAGRA) 100 MG tablet Take 1-2 tabs PO 1/2-1 hr prior to intercourse. 20 tablet 4  . spironolactone (ALDACTONE) 25 MG tablet Take 0.5 tablets (12.5 mg total) by mouth daily. 30 tablet 5  . tamsulosin (FLOMAX) 0.4 MG CAPS capsule Take 2 capsules (0.8 mg total) by mouth daily. 180 capsule 1  . triamcinolone cream (KENALOG) 0.1 % Apply sparingly to red itchy areas twice daily as needed. Avoid face, neck, armpits or groin area. Do not use more than 3 weeks in a row. 45 g  5   No current facility-administered medications for this visit.    Psychiatric Specialty Exam: Review of Systems  There were no vitals taken for this visit.There is no height or weight on file to calculate BMI.  General Appearance: {Appearance:22683}  Eye Contact:  {BHH EYE CONTACT:22684}  Speech:  {Speech:22685}  Volume:  {Volume (PAA):22686}  Mood:  {BHH MOOD:22306}  Affect:  {Affect (PAA):22687}  Thought Process:  {Thought Process (PAA):22688}  Orientation:  {BHH  ORIENTATION (PAA):22689}  Thought Content:  {Thought Content:22690}  Suicidal Thoughts:  {ST/HT (PAA):22692}  Homicidal Thoughts:  {ST/HT (PAA):22692}  Memory:  {BHH MEMORY:22881}  Judgement:  {Judgement (PAA):22694}  Insight:  {Insight (PAA):22695}  Psychomotor Activity:  {Psychomotor (PAA):22696}  Concentration:  {Concentration:21399}  Recall:  {BHH GOOD/FAIR/POOR:22877}  Fund of Knowledge:{BHH GOOD/FAIR/POOR:22877}  Language: {BHH GOOD/FAIR/POOR:22877}  Akathisia:  {BHH YES OR NO:22294}    AIMS (if indicated):  {Desc; done/not:10129}  Assets:  {Assets (PAA):22698}  ADL's:  {BHH WJX'B:14782}  Cognition: {chl bhh cognition:304700322}  Sleep:  {BHH GOOD/FAIR/POOR:22877}   Screenings: GAD-7    Flowsheet Row Office Visit from 06/17/2022 in [facility] Office Visit from 03/04/2022 in [facility] Office Visit from 03/19/2021 in [place] Office Visit from 04/09/2020 in [place] Office Visit from 10/24/2019 in [facility]  Total GAD-7 Score 16 5 3 4 6       PHQ2-9    [place] Office Visit from 06/17/2022 in [facility] Office Visit from 03/04/2022 in [place] Office Visit from 03/19/2021 in [place] Office Visit from 04/09/2020 in [place] Office Visit from 10/24/2019 in [place]  PHQ-2 Total Score 3 2 2 1 2   PHQ-9 Total Score 5 5 6  -- 4      Flowsheet Row ED from 06/17/2022 in [facility] Most recent reading at 06/17/2022  5:06 PM ED from 05/28/2022 in [facility] Most recent reading at 05/28/2022  4:58 PM ED from 05/28/2022 in [facility] Most recent reading at 05/28/2022  3:28 PM  C-SSRS RISK  CATEGORY High Risk No Risk No Risk        Collaboration of Care: Medication Management AEB medication prescription, Primary Care Provider AEB chart review, Psychiatrist AEB urgent care chart review, Other provider involved in patient's care [facility] and ED chart review , and Referral or follow-up with counselor/therapist AEB therapy referral  Patient/Guardian was advised Release of Information must be obtained prior to any record release in order to collaborate their care with an outside provider. Patient/Guardian was advised if they have not already done so to contact the registration department to sign all necessary forms in order for [place] to release information regarding their care.   Consent: Patient/Guardian gives verbal consent for treatment and assignment of benefits for services provided during this visit. Patient/Guardian expressed understanding and agreed to proceed.   Vista Mink, MD 1/18/20249:20 AM    Virtual Visit via Video Note  I connected with Zenas Santa on 06/26/22 at  1:00 PM EST by a video enabled telemedicine application and verified that I am speaking with the correct person using two identifiers.  Location: Patient: Home Provider: Home office   I discussed the limitations of evaluation and management by telemedicine and the availability  of in person appointments. The patient expressed understanding and agreed to proceed.   I discussed the assessment and treatment plan with the patient. The patient was provided an opportunity to ask questions and all were answered. The patient agreed with the plan and demonstrated an understanding of the instructions.   The patient was advised to call back or seek an in-person evaluation if the symptoms worsen or if the condition fails to improve as anticipated.  I provided *** minutes of non-face-to-face time during this encounter.   Vista Mink, MD

## 2022-06-30 ENCOUNTER — Ambulatory Visit (INDEPENDENT_AMBULATORY_CARE_PROVIDER_SITE_OTHER): Payer: Medicaid Other

## 2022-06-30 ENCOUNTER — Telehealth (HOSPITAL_COMMUNITY): Payer: Self-pay | Admitting: Licensed Clinical Social Worker

## 2022-06-30 DIAGNOSIS — I5022 Chronic systolic (congestive) heart failure: Secondary | ICD-10-CM

## 2022-06-30 DIAGNOSIS — Z95810 Presence of automatic (implantable) cardiac defibrillator: Secondary | ICD-10-CM | POA: Diagnosis not present

## 2022-06-30 NOTE — Telephone Encounter (Signed)
H&V Care Navigation CSW Progress Note  Clinical Social Worker called pt to check in regarding mental health and financial concerns.  Pt reports he is doing much better.  Having less panic attacks- only about 3 a day and the severity is greatly decreased.  Can anticipate when he is starting to panic and takes an ativan.  Was supposed to see a psychiatrist last week but his phone stopped working- now rescheduled for 2/1.  Pt reports that payment for [employer] arrived ok and he has payment plan arranged to pay off the remainder- reports no concerns with losing power at this time.  Patient is participating in a Managed Medicaid Plan:  Yes  [place]: No Food Insecurity (05/14/2022)  Housing: Low Risk  (05/14/2022)  Transportation Needs: No Transportation Needs (05/06/2022)  Utilities: At Risk (05/08/2022)  Alcohol Screen: Low Risk  (10/13/2018)  Depression (PHQ2-9): Medium Risk (06/17/2022)  Financial Resource Strain: High Risk (05/08/2022)  Social Connections: Moderately Integrated (03/12/2021)  Tobacco Use: Medium Risk (06/17/2022)     Pt reports no further needs or concerns at this time  [name], [facility] Desk#: [phone] Cell#: [phone]

## 2022-07-01 ENCOUNTER — Encounter: Payer: Self-pay | Admitting: Internal Medicine

## 2022-07-01 ENCOUNTER — Other Ambulatory Visit: Payer: Self-pay | Admitting: Internal Medicine

## 2022-07-01 DIAGNOSIS — E1165 Type 2 diabetes mellitus with hyperglycemia: Secondary | ICD-10-CM

## 2022-07-01 MED ORDER — FREESTYLE LIBRE 2 SENSOR MISC: 11 refills | Status: DC

## 2022-07-02 NOTE — Progress Notes (Signed)
EPIC Encounter for ICM Monitoring  Patient Name: [name] is a 57 y.o. male Date: 07/02/2022 Primary Care Physican: [name], MD Primary Cardiologist: Stanford Breed Electrophysiologist: Quentin Ore 06/17/2022 Office Weight: 251 lbs                            Transmission reviewed.    [place] thoracic impedance normal but was suggesting possible fluid accumulation from 12/25-12/29 and 1/14-1/17.   Prescribed dosage:  Furosemide 80 mg take 1 tablet (80 mg total) as needed daily for fluid.    Potassium 20 mEq take 1 tablet by mouth twice a day Spironolactone 25 mg take 1 tablet (0.5 mg total) by mouth once a day.    Labs: 06/16/2022 Creatinine 2.07, BUN 23, Potassium 4.1, Sodium 138, GFR 37  06/15/2022 Creatinine 2.33, BUN 25, Potassium 4.1, Sodium 137, GFR 32  06/14/2022 Creatinine 2.65, BUN 19, Potassium 4.4, Sodium 136, GFR 27  05/28/2022 Creatinine 2.22, BUN 25, Potassium 5.7, Sodium 137, GFR 34 05/26/2022 Creatinine 2.12, BUN 18, Potassium 5.0, Sodium 136, GFR 36  05/23/2022 Creatinine 2.02, BUN 19, Potassium 5.4, Sodium 140  05/17/2022 Creatinine 1.85, BUN 30, Potassium 5.4, Sodium 140, GFR 42  05/16/2022 Creatinine 1.85, BUN 31, Potassium 4.8, Sodium 136, GFR 42  A complete set of results can be found in Results Review.   Recommendations:  No changes   Follow-up plan: ICM clinic phone appointment on 08/04/2022.   91 day device clinic remote transmission 07/15/2022.     EP/Cardiology Office Visits:  07/07/2022 with Tommye Standard, PA.  07/08/2022 with Coletta Memos, NP.  08/19/2022 with Dr Quentin Ore.  08/22/2022 with Dr Daniel Nones.   Copy of ICM check sent to Dr Quentin Ore.   3 month ICM trend: 06/30/2022.    12-14 Month ICM trend:     [name], RN 07/02/2022 2:46 PM

## 2022-07-05 NOTE — Progress Notes (Unsigned)
Cardiology Office Note Date:  07/05/2022  Patient ID:  [name] [name] 13-Jul-1965, MRN [id number] PCP:  [name], MD  Cardiologist:  [name] Electrophysiologist: Dr. Rayann Heman >> Dr. Quentin Ore    Chief Complaint:  post hospital/wound check  History of Present Illness: [name] [name] is a 57 y.o. male with history of NICM/chronic systolic CHF s/p ICD, normal coronary arteries by cath 2015, GERD, HTN and DM2, VT/VF.  Admitted 05/03/22 with VT storm, 22 ICD shocks and 2 ATP, 10 aborted shocks on initial arrival. Felt to be likely in setting of hypokalemia, diarrhea, and infection. However despite had recurrent VT despite management and correction of both and back on amiodarone gtt  >>  PO amiodarone > developed bradycardia and intermittent RV pacing that triggered NSPMVT episodes, ultimately felt to be pause dependent and not pacing triggered. R/LHC looked good, volume stable, no obstructive CAD, LVEF stable 30-35% Underwent device upgrade to a dual chamber device 05/15/22 by Dr. Quentin Ore EP signed off 05/16/22 cleared to start BB of HF team's choice.  Note that initially with diarrhea, gluteal wound?  suspected due to increase in Trulicity dose out patient:  Trulicity discontinued> C. Difficile and GI penl negative.   And got significantly better.     Gen surgery signed off 05/09/22 note: "No signs of abscess or indication for surgery at this time given exam more consistent with cellulitis and recent CT and u/s negative for underlying abscess. Continue antibiotics. If exam becomes more concerning for abscess development could repeat u/s and call us back. We will sign off, please call with questions or concerns." "No real signs of abscess that is drainable. May coalesce to abscess with time.  Would rec [place] to eval and can then drain at that time." Empiric course of  zosyn finished 12/6 11/30- shows region of concern along the right buttock infiltrative subcutaneous edema no  abscess or drainable process observed.  CT scan 11/27 no evidence of abscess or drainable fluid collection.   Surgery signed off, no need for procedure.   Empiric Zosyn will be continued x10 days until 12/6   By our evaluation pre-device upgrade I do not note any open wounds.  Discharged 05/17/22 Toprol 25mg  daily Amiodarone 400mg  BID to 12/9 > 400mg  daily until f/u and reassessment no SGLT2 w/ perineal infection  PRN lasix  I saw him 05/23/22 He has had a couple episodes that he felt like he was either having panic/anxiety or tachycardia. He is scared about getting shocked again. He has not had CP, no near syncope or syncope. With these episodes he feels breathless, and has the sense of palpitations that are reminiscent of how he felt before getting shocked. He had similar symptoms while on the programmer, made him feel like he was about to be shocked with normal rhythm and no testing in progress. He was referred to Dr. Michail Sermon, as an urgent referral  He saw HF team 05/26/22, toprol increased, spiro decreased (2/2 elevated K+), Farxiga increased, planned for cardiac rehab, started on Prozac  ER visit 12/20-21/23, SOB, anxiety, palpitations, rx ativan to get him to his behavioral health visit  Hospitalized 06/14/22 - 06/16/22 some SOB, palpitations, CP, largely all felt to be 2/2 anxiety/panic, hydroxyzine added  Behavioral ER visit with anxiety/panic Saw Dr. Nelida Gores, psychiatry, 06/27/22 (incomplete note).  *** amio labs *** VT, rhythm *** volume *** HF team *** avoid threshold testing if autotests are stable    Device information Abbott single chamber  ICD implanted 12/23/2013 >> upgraded to dual chamber devce ( A lead added) 05/15/22  + appropriate tx, VT storm  AAD hx Amiodarone started Nov 2023  Past Medical History:  Diagnosis Date   AICD (automatic cardioverter/defibrillator) present    Chronic bronchitis ([place])    "get it most q yr" (12/23/2013)   Chronic systolic  (congestive) heart failure ([place])    Fracture of left humerus    a. 07/2013.   GERD (gastroesophageal reflux disease)    not at present time   Heart murmur    "born w/it"    History of renal calculi    Hypertension    Kidney stones    NICM (nonischemic cardiomyopathy) ([place])    a. 07/2013 Echo: EF 20-25%, diff HK, Gr2 DD, mild MR, mod dil LA/RA. 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    Shockable heart rhythm detected by automated external defibrillator    Syncope    Type II diabetes mellitus ([place]) 2006    Past Surgical History:  Procedure Laterality Date   CARDIAC CATHETERIZATION  08/10/13   CHOLECYSTECTOMY  ~ 2010   IMPLANTABLE CARDIOVERTER DEFIBRILLATOR IMPLANT  12/23/2013   STJ Fortify ICD implanted by [name] for cardiomyopathy and syncope   IMPLANTABLE CARDIOVERTER DEFIBRILLATOR IMPLANT N/A 12/23/2013   Procedure: IMPLANTABLE CARDIOVERTER DEFIBRILLATOR IMPLANT;  Surgeon: Coralyn Mark, MD;  Location: [facility] CATH LAB;  Service: Cardiovascular;  Laterality: N/A;   INGUINAL HERNIA REPAIR Left 03/01/2018   Procedure: OPEN REPAIR OF LEFT INGUINAL HERNIA WITH MESH;  Surgeon: Greer Pickerel, MD;  Location: [facility] ORS;  Service: General;  Laterality: Left;   LEAD REVISION/REPAIR N/A 05/15/2022   Procedure: LEAD REVISION/REPAIR;  Surgeon: Vickie Epley, MD;  Location: [facility] [place] [facility] CV LAB;  Service: Cardiovascular;  Laterality: N/A;   LEFT HEART CATHETERIZATION WITH CORONARY ANGIOGRAM N/A 08/10/2013   Procedure: LEFT HEART CATHETERIZATION WITH CORONARY ANGIOGRAM;  Surgeon: Jettie Booze, MD;  Location[facility] CATH LAB;  Service: Cardiovascular;  Laterality: N/A;   ORIF HUMERUS FRACTURE Left 08/12/2013   Procedure: OPEN REDUCTION INTERNAL FIXATION (ORIF) HUMERAL SHAFT FRACTURE;  Surgeon: Newt Minion, MD;  Location: [facility];  Service: Orthopedics;  Laterality: Left;  Open Reduction Internal Fixation Left Humerus   RIGHT/LEFT  HEART CATH AND CORONARY ANGIOGRAPHY N/A 05/12/2022   Procedure: RIGHT/LEFT HEART CATH AND CORONARY ANGIOGRAPHY;  Surgeon: Hebert Soho, DO;  Location: [place] CV LAB;  Service: Cardiovascular;  Laterality: N/A;   URETEROSCOPY     "laser for kidney stones"    Current Outpatient Medications  Medication Sig Dispense Refill   Accu-Chek Softclix Lancets lancets Use as instructed 100 each 12   acetaminophen (TYLENOL) 500 MG tablet Take 1,000 mg by mouth 2 (two) times daily.     albuterol (VENTOLIN HFA) 108 (90 Base) MCG/ACT inhaler Inhale into the lungs every 6 (six) hours as needed for wheezing or shortness of breath.     amiodarone (PACERONE) 200 MG tablet Take 1 tablet (200 mg total) by mouth daily. 30 tablet 0   aspirin 81 MG tablet Take 81 mg by mouth daily.     atorvastatin (LIPITOR) 10 MG tablet Take 1 tablet (10 mg total) by mouth daily. 90 tablet 1   Blood Glucose Monitoring Suppl (ACCU-CHEK GUIDE) w/Device KIT UAD 1 kit 0   cetirizine (ZYRTEC) 10 MG tablet Take 1 tablet (10 mg total) by mouth daily. 90 tablet 1  cholecalciferol (VITAMIN D3) 25 MCG (1000 UNIT) tablet Take 1,000 Units by mouth daily.     clobetasol ointment (TEMOVATE) 2.53 % Apply 1 Application topically 2 (two) times daily. (Patient taking differently: Apply 1 Application topically daily as needed (for eczema).) 30 g 3   Continuous Blood Gluc Sensor (FREESTYLE LIBRE 2 SENSOR) MISC Use to check blood sugar TID. Change sensor once every 14 days. E11.65 2 each 11   dapagliflozin propanediol (FARXIGA) 10 MG TABS tablet Take 1 tablet (10 mg total) by mouth daily before breakfast. 90 tablet 3   EPINEPHrine 0.3 mg/0.3 mL IJ SOAJ injection Inject 0.3 mg into the muscle as needed. 1 each 1   famotidine (PEPCID) 20 MG tablet Take 1 tablet by mouth 1-2 times a day as needed. (Patient taking differently: Take 20 mg by mouth 2 (two) times daily as needed for heartburn or indigestion.) 90 tablet 1   FLUoxetine (PROZAC) 10 MG  capsule Take 1 capsule (10 mg total) by mouth daily. (Patient not taking: Reported on 06/23/2022) 60 capsule 3   fluticasone (FLONASE) 50 MCG/ACT nasal spray Use 1-2 sprays in each nostril once a day as needed for nasal congestion. 16 g 5   furosemide (LASIX) 80 MG tablet Take 1 tablet (80 mg total) by mouth daily as needed for edema or fluid. 180 tablet 1   gabapentin (NEURONTIN) 300 MG capsule TAKE 1 CAPSULE BY MOUTH EVERY DAY AT BEDTIME (Patient taking differently: Take 300 mg by mouth at bedtime.) 90 capsule 1   glucose blood (ACCU-CHEK GUIDE) test strip Use as instructed 100 each 12   insulin aspart (NOVOLOG FLEXPEN) 100 UNIT/ML FlexPen Inject 10 Units into the skin 2 (two) times daily before a meal. 15 mL 2   insulin glargine (LANTUS SOLOSTAR) 100 UNIT/ML Solostar Pen Inject 30 Units into the skin daily. 15 mL 1   Insulin Pen Needle (PEN NEEDLES) 31G X 8 MM MISC UAD 100 each 6   ipratropium-albuterol (DUONEB) 0.5-2.5 (3) MG/3ML SOLN Take 3 mLs by nebulization every 6 (six) hours as needed. 360 mL 2   Lancet Devices (ACCU-CHEK SOFTCLIX) lancets 1 each by Other route 3 (three) times daily. 1 each 0   LORazepam (ATIVAN) 0.5 MG tablet Take 1 tablet (0.5 mg total) by mouth 2 (two) times daily as needed for anxiety. 60 tablet 0   methocarbamol (ROBAXIN) 500 MG tablet TAKE 1 TABLET BY MOUTH ONCE DAILY AS NEEDED FOR MUSCLE SPASM (Patient taking differently: Take 500 mg by mouth daily as needed for muscle spasms.) 30 tablet 0   metoprolol succinate (TOPROL-XL) 50 MG 24 hr tablet Take 1 tablet (50 mg total) by mouth daily. 90 tablet 3   potassium chloride SA (KLOR-CON M) 20 MEQ tablet Take 1 tablet (20 mEq total) by mouth 2 (two) times daily. (Patient not taking: Reported on 06/23/2022) 60 tablet 5   sacubitril-valsartan (ENTRESTO) 49-51 MG Take 1 tablet by mouth 2 (two) times daily. 180 tablet 3   sildenafil (VIAGRA) 100 MG tablet Take 1-2 tabs PO 1/2-1 hr prior to intercourse. 20 tablet 4    spironolactone (ALDACTONE) 25 MG tablet Take 0.5 tablets (12.5 mg total) by mouth daily. 30 tablet 5   tamsulosin (FLOMAX) 0.4 MG CAPS capsule Take 2 capsules (0.8 mg total) by mouth daily. 180 capsule 1   triamcinolone cream (KENALOG) 0.1 % Apply sparingly to red itchy areas twice daily as needed. Avoid face, neck, armpits or groin area. Do not use more than 3 weeks in  a row. 45 g 5   No current facility-administered medications for this visit.    Allergies:   Bee venom and Trulicity [dulaglutide]   Social History:  The patient  reports that he quit smoking about 8 years ago. His smoking use included cigarettes. He has a 28.00 pack-year smoking history. He has never used smokeless tobacco. He reports current alcohol use. He reports current drug use. Drug: Marijuana.   Family History:  The patient's family history includes Arthritis in his father and mother; Diabetes in his father and mother; Hypertension in his brother, father, and mother.  ROS:  Please see the history of present illness.    All other systems are reviewed and otherwise negative.   PHYSICAL EXAM:  VS:  There were no vitals taken for this visit. BMI: There is no height or weight on file to calculate BMI. Well nourished, well developed, in no acute distress HEENT: normocephalic, atraumatic Neck: no JVD, carotid bruits or masses Cardiac: ***  RRR; no significant murmurs, no rubs, or gallops Lungs: *** CTA b/l, no wheezing, rhonchi or rales Abd: soft, nontender MS: no deformity or atrophy Ext: ***no edema Skin: warm and dry, no rash Neuro:  No gross deficits appreciated Psych: euthymic mood, full affect  ICD site: ***   EKG:  not done today  Device interrogation done today and reviewed by myself:  ***   05/12/22: R/LHC HEMODYNAMICS: RA:                  1 mmHg (mean) RV:                  22/1-3 mmHg PA:                  23/6 mmHg (12 mean) PCWP:            4 mmHg (mean)                                       Estimated Fick CO/CI   7 L/min, 2.9 L/min/m2                                                 TPG                 8  mmHg                                              PVR                 ~1 Wood Units  PAPi                >5       IMPRESSION: Low pre and post capillary filling pressures.  Normal cardiac output/cardiac index.  Normal PVR & PA mean No obstructive CAD, left dominant w/ large Lcx supplying multiple large marginals.    05/06/22: TTE 1. Left ventricular ejection fraction, by estimation, is 30 to 35%. The  left ventricle has moderately decreased function. The left ventricle  demonstrates global hypokinesis. The left ventricular internal cavity size  was moderately dilated. There is mild  eccentric left ventricular hypertrophy. Left ventricular diastolic  parameters are consistent with Grade I diastolic dysfunction (impaired  relaxation).   2. Right ventricular systolic function is normal. The right ventricular  size is normal. Tricuspid regurgitation signal is inadequate for assessing  PA pressure.   3. Left atrial size was moderately dilated.   4. The mitral valve is normal in structure. No evidence of mitral valve  regurgitation.   5. The aortic valve is tricuspid. Aortic valve regurgitation is not  visualized. No aortic stenosis is present.   6. Aortic dilatation noted. There is borderline dilatation of the aortic  root, measuring 38 mm.   7. The inferior vena cava is normal in size with greater than 50%  respiratory variability, suggesting right atrial pressure of 3 mmHg.   Comparison(s): No significant change from prior study. Prior images  reviewed side by side.    Recent Labs: 03/21/2022: Pro B Natriuretic peptide (BNP) 18.0 05/23/2022: ALT 14; TSH 1.150 06/14/2022: B Natriuretic Peptide 33.3; Hemoglobin 13.6; Platelets 335 06/15/2022: Magnesium 2.3 06/16/2022: BUN 23; Creatinine, Ser 2.07; Potassium 4.1; Sodium 138  08/26/2021: Chol/HDL Ratio 4.2;  Cholesterol, Total 178; HDL 42; LDL Chol Calc (NIH) 81; Triglycerides 336   Estimated Creatinine Clearance: 51.9 mL/min (A) (by C-G formula based on SCr of 2.07 mg/dL (H)).   Wt Readings from Last 3 Encounters:  06/23/22 256 lb 12.8 oz (116.5 kg)  06/17/22 251 lb (113.9 kg)  06/14/22 253 lb (114.8 kg)     Other studies reviewed: Additional studies/records reviewed today include: summarized above  ASSESSMENT AND PLAN:  ICD *** Intact function ***  VT/VF Amiodarone *** No arrhythmias ***  NICM Chronic CHF (systolic) *** No CorVue data yet *** No exam findings to suggest volume OL *** c/w HF team  *** He is not driving   5. Anxiety 6. PTSD ***   Disposition: ***   Current medicines are reviewed at length with the patient today.  The patient did not have any concerns regarding medicines.  Venetia Night, PA-C 07/05/2022 10:33 AM     [address] [place] [address] [phone] (office)  [phone] (fax)

## 2022-07-06 ENCOUNTER — Encounter: Payer: Self-pay | Admitting: Internal Medicine

## 2022-07-07 ENCOUNTER — Ambulatory Visit: Payer: Medicaid Other | Attending: Student | Admitting: Physician Assistant

## 2022-07-07 ENCOUNTER — Telehealth: Payer: Self-pay | Admitting: Physician Assistant

## 2022-07-07 ENCOUNTER — Encounter: Payer: Self-pay | Admitting: Physician Assistant

## 2022-07-07 VITALS — BP 110/78 | HR 68 | Ht 72.0 in | Wt 261.4 lb

## 2022-07-07 DIAGNOSIS — Z95810 Presence of automatic (implantable) cardiac defibrillator: Secondary | ICD-10-CM

## 2022-07-07 DIAGNOSIS — I428 Other cardiomyopathies: Secondary | ICD-10-CM

## 2022-07-07 DIAGNOSIS — Z79899 Other long term (current) drug therapy: Secondary | ICD-10-CM | POA: Diagnosis not present

## 2022-07-07 DIAGNOSIS — I5022 Chronic systolic (congestive) heart failure: Secondary | ICD-10-CM | POA: Diagnosis not present

## 2022-07-07 DIAGNOSIS — I472 Ventricular tachycardia, unspecified: Secondary | ICD-10-CM

## 2022-07-07 DIAGNOSIS — I4901 Ventricular fibrillation: Secondary | ICD-10-CM | POA: Diagnosis not present

## 2022-07-07 LAB — CUP PACEART INCLINIC DEVICE CHECK
Battery Remaining Longevity: 102 mo
Brady Statistic RA Percent Paced: 72 %
Brady Statistic RV Percent Paced: 0.2 %
Date Time Interrogation Session: 20240129174014
HighPow Impedance: 56.25 Ohm
Implantable Lead Connection Status: 753985
Implantable Lead Connection Status: 753985
Implantable Lead Implant Date: 20150717
Implantable Lead Implant Date: 20231207
Implantable Lead Location: 753859
Implantable Lead Location: 753860
Implantable Pulse Generator Implant Date: 20231207
Lead Channel Impedance Value: 350 Ohm
Lead Channel Impedance Value: 412.5 Ohm
Lead Channel Pacing Threshold Amplitude: 0.625 V
Lead Channel Pacing Threshold Pulse Width: 0.5 ms
Lead Channel Sensing Intrinsic Amplitude: 12 mV
Lead Channel Sensing Intrinsic Amplitude: 2.4 mV
Lead Channel Setting Pacing Amplitude: 1.625
Lead Channel Setting Pacing Amplitude: 2 V
Lead Channel Setting Pacing Pulse Width: 0.5 ms
Lead Channel Setting Sensing Sensitivity: 0.5 mV
Pulse Gen Serial Number: 810051574
Zone Setting Status: 755011

## 2022-07-07 MED ORDER — ENTRESTO 24-26 MG PO TABS: 1.0000 | ORAL_TABLET | Freq: Two times a day (BID) | ORAL | 2 refills | Status: DC

## 2022-07-07 NOTE — Patient Instructions (Addendum)
Medication Instructions:   START TAKING : ENTRESTO  24/26 TWICE A DAY     *If you need a refill on your cardiac medications before your next appointment, please call your pharmacy*   Lab Work:   RETURN FOR LABS  LFT AND TSH  ON FOLLOW UP  [name]    If you have labs (blood work) drawn today and your tests are completely normal, you will receive your results only by: [place] (if you have MyChart) OR A paper copy in the mail If you have any lab test that is abnormal or we need to change your treatment, we will call you to review the results.   Testing/Procedures: NONE ORDERED  TODAY     Follow-Up: At [facility], you and your health needs are our priority.  As part of our continuing mission to provide you with exceptional heart care, we have created designated Provider Care Teams.  These Care Teams include your primary Cardiologist (physician) and Advanced Practice Providers (APPs -  Physician Assistants and Nurse Practitioners) who all work together to provide you with the care you need, when you need it.  We recommend signing up for the patient portal called "MyChart".  Sign up information is provided on this After Visit Summary.  MyChart is used to connect with patients for Virtual Visits (Telemedicine).  Patients are able to view lab/test results, encounter notes, upcoming appointments, etc.  Non-urgent messages can be sent to your provider as well.   To learn more about what you can do with MyChart, go to [email or web address].    Your next appointment:  AS SCHEDULED WITH LAMBERT    Other Instructions

## 2022-07-07 NOTE — Progress Notes (Unsigned)
Cardiology Clinic Note   Patient Name: [name] Date of Encounter: 07/08/2022  Primary Care Provider:  [name], MD Primary Cardiologist:  [name], MD  Patient Profile    [name] 57 year old male presents to the clinic today for follow-up evaluation of his VT.  Past Medical History    Past Medical History:  Diagnosis Date   AICD (automatic cardioverter/defibrillator) present    Chronic bronchitis ([place])    "get it most q yr" (12/23/2013)   Chronic systolic (congestive) heart failure ([place])    Fracture of left humerus    a. 07/2013.   GERD (gastroesophageal reflux disease)    not at present time   Heart murmur    "born w/it"    History of renal calculi    Hypertension    Kidney stones    NICM (nonischemic cardiomyopathy) ([place])    a. 07/2013 Echo: EF 20-25%, diff HK, Gr2 DD, mild MR, mod dil LA/RA. 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    Shockable heart rhythm detected by automated external defibrillator    Syncope    Type II diabetes mellitus ([place]) 2006   Past Surgical History:  Procedure Laterality Date   CARDIAC CATHETERIZATION  08/10/13   CHOLECYSTECTOMY  ~ 2010   IMPLANTABLE CARDIOVERTER DEFIBRILLATOR IMPLANT  12/23/2013   STJ Fortify ICD implanted by [name] for cardiomyopathy and syncope   IMPLANTABLE CARDIOVERTER DEFIBRILLATOR IMPLANT N/A 12/23/2013   Procedure: IMPLANTABLE CARDIOVERTER DEFIBRILLATOR IMPLANT;  Surgeon: Coralyn Mark, MD;  Location: [facility] CATH LAB;  Service: Cardiovascular;  Laterality: N/A;   INGUINAL HERNIA REPAIR Left 03/01/2018   Procedure: OPEN REPAIR OF LEFT INGUINAL HERNIA WITH MESH;  Surgeon: Greer Pickerel, MD;  Location: [facility] ORS;  Service: General;  Laterality: Left;   LEAD REVISION/REPAIR N/A 05/15/2022   Procedure: LEAD REVISION/REPAIR;  Surgeon: Vickie Epley, MD;  Location: [place] CV LAB;  Service: Cardiovascular;   Laterality: N/A;   LEFT HEART CATHETERIZATION WITH CORONARY ANGIOGRAM N/A 08/10/2013   Procedure: LEFT HEART CATHETERIZATION WITH CORONARY ANGIOGRAM;  Surgeon: Jettie Booze, MD;  Location: [facility] CATH LAB;  Service: Cardiovascular;  Laterality: N/A;   ORIF HUMERUS FRACTURE Left 08/12/2013   Procedure: OPEN REDUCTION INTERNAL FIXATION (ORIF) HUMERAL SHAFT FRACTURE;  Surgeon: Newt Minion, MD;  Location: [place];  Service: Orthopedics;  Laterality: Left;  Open Reduction Internal Fixation Left Humerus   RIGHT/LEFT HEART CATH AND CORONARY ANGIOGRAPHY N/A 05/12/2022   Procedure: RIGHT/LEFT HEART CATH AND CORONARY ANGIOGRAPHY;  Surgeon: Hebert Soho, DO;  Location: [place] CV LAB;  Service: Cardiovascular;  Laterality: N/A;   URETEROSCOPY     "laser for kidney stones"    Allergies  Allergies  Allergen Reactions   Bee Venom Anaphylaxis   Trulicity [Dulaglutide] Diarrhea    Extra dose of Trulicity caused cardiac arrest. Patient experience onset of diarrhea with using this medication - cleared after med stopped.    History of Present Illness    [name] has a PMH of nonischemic cardiomyopathy, chronic systolic CHF status post ICD, normal coronary anatomy via cath 2015, GERD, type 2 diabetes, hypertension, VT/VF.  He was admitted 05/03/2022 with VT storm.  He received 22 ICD shocks, also had 10 aborted shocks.  Has right and left heart cath showed no coronary disease.  His LVEF was noted to be 30-35%.  He underwent a device upgrade to  dual-chamber device 05/15/2022 by Dr. Quentin Ore.  He was discharged in stable condition on 05/17/2022.  He was started on amiodarone 11/23.  He was admitted 06/14/2021 with symptoms of shortness of breath that were felt to be related to anxiety.  He presents to the clinic today for follow-up evaluation and states he continues to feel some heaviness in his left chest and to have anxiety about his ICD firing.  We reviewed his hospitalization and device upgrade.   He reports understanding.  His blood pressure has been well-controlled at home.  He reports that he has been fairly sedentary due to concern for his breathing when he increases his physical activity.  He also reports a breathing catch intermittently that is very brief.  I encouraged him to slowly increase his physical activity.  His medication was adjusted yesterday by EP.  I will not make any adjustments today.  We reviewed his upcoming remote device check and psychiatry appointments.  Will plan follow-up in 6 months.  I will also give mindfulness stress reduction instructions.  Today he denies chest pain,  lower extremity edema, fatigue, palpitations, melena, hematuria, hemoptysis, diaphoresis, weakness, presyncope, syncope, orthopnea, and PND.     Home Medications    Prior to Admission medications   Medication Sig Start Date End Date Taking? Authorizing Provider  Accu-Chek Softclix Lancets lancets Use as instructed 02/01/22   Cristie Hem, MD  acetaminophen (TYLENOL) 500 MG tablet Take 1,000 mg by mouth 2 (two) times daily.    [provider]  albuterol (VENTOLIN HFA) 108 (90 Base) MCG/ACT inhaler Inhale into the lungs every 6 (six) hours as needed for wheezing or shortness of breath.    [provider]  amiodarone (PACERONE) 200 MG tablet Take 1 tablet (200 mg total) by mouth daily. 06/17/22 07/17/22  Antonieta Pert, MD  aspirin 81 MG tablet Take 81 mg by mouth daily.    [provider]  atorvastatin (LIPITOR) 10 MG tablet Take 1 tablet (10 mg total) by mouth daily. 01/19/22   Elsie Stain, MD  Blood Glucose Monitoring Suppl (ACCU-CHEK GUIDE) w/Device KIT UAD 02/01/22   Cristie Hem, MD  cetirizine (ZYRTEC) 10 MG tablet Take 1 tablet (10 mg total) by mouth daily. 01/30/22   Valentina Shaggy, MD  cholecalciferol (VITAMIN D3) 25 MCG (1000 UNIT) tablet Take 1,000 Units by mouth daily.    [provider]  clobetasol ointment (TEMOVATE) 2.69 % Apply  1 Application topically 2 (two) times daily. Patient taking differently: Apply 1 Application topically daily as needed (for eczema). 01/30/22   Valentina Shaggy, MD  Continuous Blood Gluc Sensor (FREESTYLE LIBRE 2 SENSOR) MISC Use to check blood sugar TID. Change sensor once every 14 days. E11.65 07/01/22   [name], MD  dapagliflozin propanediol (FARXIGA) 10 MG TABS tablet Take 1 tablet (10 mg total) by mouth daily before breakfast. 05/26/22   [name], Aditya, DO  EPINEPHrine 0.3 mg/0.3 mL IJ SOAJ injection Inject 0.3 mg into the muscle as needed. 01/30/22   Valentina Shaggy, MD  famotidine (PEPCID) 20 MG tablet Take 1 tablet by mouth 1-2 times a day as needed. Patient taking differently: Take 20 mg by mouth 2 (two) times daily as needed for heartburn or indigestion. 02/18/22   Valentina Shaggy, MD  FLUoxetine (PROZAC) 10 MG capsule Take 1 capsule (10 mg total) by mouth daily. Patient not taking: Reported on 06/23/2022 05/26/22   [name], Aditya, DO  fluticasone (FLONASE) 50 MCG/ACT nasal spray  Use 1-2 sprays in each nostril once a day as needed for nasal congestion. 01/30/22   Valentina Shaggy, MD  furosemide (LASIX) 80 MG tablet Take 1 tablet (80 mg total) by mouth daily as needed for edema or fluid. 05/17/22   Samuella Cota, MD  gabapentin (NEURONTIN) 300 MG capsule TAKE 1 CAPSULE BY MOUTH EVERY DAY AT BEDTIME Patient taking differently: Take 300 mg by mouth at bedtime. 10/05/20   [name], MD  glucose blood (ACCU-CHEK GUIDE) test strip Use as instructed 02/01/22   Cristie Hem, MD  insulin aspart (NOVOLOG FLEXPEN) 100 UNIT/ML FlexPen Inject 10 Units into the skin 2 (two) times daily before a meal. 04/07/22   [name], MD  insulin glargine (LANTUS SOLOSTAR) 100 UNIT/ML Solostar Pen Inject 30 Units into the skin daily. 03/21/22   [name], MD  Insulin Pen Needle (PEN NEEDLES) 31G X 8 MM MISC UAD 03/01/22   [name], MD   ipratropium-albuterol (DUONEB) 0.5-2.5 (3) MG/3ML SOLN Take 3 mLs by nebulization every 6 (six) hours as needed. 03/21/22   Juanito Doom, MD  Lancet Devices [facility]) lancets 1 each by Other route 3 (three) times daily. 10/20/14   Funches, Adriana Mccallum, MD  LORazepam (ATIVAN) 0.5 MG tablet Take 1 tablet (0.5 mg total) by mouth 2 (two) times daily as needed for anxiety. 06/17/22   [name], MD  methocarbamol (ROBAXIN) 500 MG tablet TAKE 1 TABLET BY MOUTH ONCE DAILY AS NEEDED FOR MUSCLE SPASM Patient taking differently: Take 500 mg by mouth daily as needed for muscle spasms. 04/25/22   Rodena Goldmann A, DO  metoprolol succinate (TOPROL-XL) 50 MG 24 hr tablet Take 1 tablet (50 mg total) by mouth daily. 05/26/22   [name], Aditya, DO  potassium chloride SA (KLOR-CON M) 20 MEQ tablet Take 1 tablet (20 mEq total) by mouth 2 (two) times daily. Patient not taking: Reported on 06/23/2022 05/09/22   [name], Aditya, DO  sacubitril-valsartan (ENTRESTO) 49-51 MG Take 1 tablet by mouth 2 (two) times daily. 06/23/22   [name], Aditya, DO  sildenafil (VIAGRA) 100 MG tablet Take 1-2 tabs PO 1/2-1 hr prior to intercourse. 12/14/20   [name], MD  spironolactone (ALDACTONE) 25 MG tablet Take 0.5 tablets (12.5 mg total) by mouth daily. 05/26/22   [name], Aditya, DO  tamsulosin (FLOMAX) 0.4 MG CAPS capsule Take 2 capsules (0.8 mg total) by mouth daily. 01/19/22   Elsie Stain, MD  triamcinolone cream (KENALOG) 0.1 % Apply sparingly to red itchy areas twice daily as needed. Avoid face, neck, armpits or groin area. Do not use more than 3 weeks in a row. 01/30/22   Valentina Shaggy, MD    Family History    Family History  Problem Relation Age of Onset   Diabetes Father    Arthritis Father    Hypertension Father    Diabetes Mother    Arthritis Mother    Hypertension Mother    Hypertension Brother    He indicated that his mother is alive. He indicated that his father  is alive. He indicated that his brother is alive. He indicated that his maternal grandmother is deceased. He indicated that his maternal grandfather is deceased. He indicated that his paternal grandmother is deceased. He indicated that his paternal grandfather is deceased.  Social History    Social History   Socioeconomic History   Marital status: Single    Spouse name: Not on file  Number of children: Not on file   Years of education: Not on file   Highest education level: Not on file  Occupational History   Occupation: Disabled  Tobacco Use   Smoking status: Former    Packs/day: 1.00    Years: 28.00    Total pack years: 28.00    Types: Cigarettes    Quit date: 06/03/2014    Years since quitting: 8.1   Smokeless tobacco: Never  Vaping Use   Vaping Use: Never used  Substance and Sexual Activity   Alcohol use: Yes   Drug use: Yes    Types: Marijuana    Comment: occasionally   Sexual activity: Yes  Other Topics Concern   Not on file  Social History Narrative   Pt lives with his brother    Social Determinants of Health   Financial Resource Strain: High Risk (05/08/2022)   Overall Financial Resource Strain (CARDIA)    Difficulty of Paying Living Expenses: Very hard  Food Insecurity: No Food Insecurity (05/14/2022)   Hunger Vital Sign    Worried About Running Out of Food in the Last Year: Never true    [place] in the Last Year: Never true  Transportation Needs: No Transportation Needs (05/06/2022)   PRAPARE - Hydrologist (Medical): No    Lack of Transportation (Non-Medical): No  Physical Activity: Not on file  Stress: Not on file  Social Connections: Moderately Integrated (03/12/2021)   Social Connection and Isolation Panel [NHANES]    Frequency of Communication with Friends and Family: More than three times a week    Frequency of Social Gatherings with Friends and Family: More than three times a week    Attends Religious Services:  More than 4 times per year    Active Member of [employer] or Organizations: Yes    Attends Archivist Meetings: Never    Marital Status: Never married  Intimate Partner Violence: Not At Risk (05/06/2022)   Humiliation, Afraid, Rape, and Kick questionnaire    Fear of Current or Ex-Partner: No    Emotionally Abused: No    Physically Abused: No    Sexually Abused: No     Review of Systems    General:  No chills, fever, night sweats or weight changes.  Cardiovascular:  No chest pain, dyspnea on exertion, edema, orthopnea, palpitations, paroxysmal nocturnal dyspnea. Dermatological: No rash, lesions/masses Respiratory: No cough, dyspnea Urologic: No hematuria, dysuria Abdominal:   No nausea, vomiting, diarrhea, bright red blood per rectum, melena, or hematemesis Neurologic:  No visual changes, wkns, Anxious in clinic today All other systems reviewed and are otherwise negative except as noted above.  Physical Exam    VS:  BP 127/78 (BP Location: Right Arm, Patient Position: Sitting, Cuff Size: Normal) Comment (BP Location): forearm  Pulse 64   Ht 6' (1.829 m)   Wt 262 lb 3.2 oz (118.9 kg)   SpO2 94%   BMI 35.56 kg/m  , BMI Body mass index is 35.56 kg/m. GEN: Well nourished, well developed, in no acute distress. HEENT: normal. Neck: Supple, no JVD, carotid bruits, or masses. Cardiac: RRR, no murmurs, rubs, or gallops. No clubbing, cyanosis, edema.  Radials/DP/PT 2+ and equal bilaterally.  Respiratory:  Respirations regular and unlabored, clear to auscultation bilaterally. GI: Soft, nontender, nondistended, BS + x 4. MS: no deformity or atrophy. Skin: warm and dry, no rash. Neuro:  Strength and sensation are intact. Psych: Normal affect.  Accessory Clinical Findings  Recent Labs: 03/21/2022: Pro B Natriuretic peptide (BNP) 18.0 05/23/2022: ALT 14; TSH 1.150 06/14/2022: B Natriuretic Peptide 33.3; Hemoglobin 13.6; Platelets 335 06/15/2022: Magnesium 2.3 06/16/2022: BUN 23;  Creatinine, Ser 2.07; Potassium 4.1; Sodium 138   Recent Lipid Panel    Component Value Date/Time   CHOL 178 08/26/2021 1608   TRIG 336 (H) 08/26/2021 1608   HDL 42 08/26/2021 1608   CHOLHDL 4.2 08/26/2021 1608   CHOLHDL 4 05/08/2009 1040   VLDL 27.0 05/08/2009 1040   LDLCALC 81 08/26/2021 1608         ECG personally reviewed by me today-none today.  Echocardiogram 05/06/2022  IMPRESSIONS     1. Left ventricular ejection fraction, by estimation, is 30 to 35%. The  left ventricle has moderately decreased function. The left ventricle  demonstrates global hypokinesis. The left ventricular internal cavity size  was moderately dilated. There is mild   eccentric left ventricular hypertrophy. Left ventricular diastolic  parameters are consistent with Grade I diastolic dysfunction (impaired  relaxation).   2. Right ventricular systolic function is normal. The right ventricular  size is normal. Tricuspid regurgitation signal is inadequate for assessing  PA pressure.   3. Left atrial size was moderately dilated.   4. The mitral valve is normal in structure. No evidence of mitral valve  regurgitation.   5. The aortic valve is tricuspid. Aortic valve regurgitation is not  visualized. No aortic stenosis is present.   6. Aortic dilatation noted. There is borderline dilatation of the aortic  root, measuring 38 mm.   7. The inferior vena cava is normal in size with greater than 50%  respiratory variability, suggesting right atrial pressure of 3 mmHg.   Comparison(s): No significant change from prior study. Prior images  reviewed side by side.   FINDINGS   Left Ventricle: Left ventricular ejection fraction, by estimation, is 30  to 35%. The left ventricle has moderately decreased function. The left  ventricle demonstrates global hypokinesis. The left ventricular internal  cavity size was moderately dilated.  There is mild eccentric left ventricular hypertrophy. Left ventricular   diastolic parameters are consistent with Grade I diastolic dysfunction  (impaired relaxation). Normal left ventricular filling pressure.   Right Ventricle: The right ventricular size is normal. No increase in  right ventricular wall thickness. Right ventricular systolic function is  normal. Tricuspid regurgitation signal is inadequate for assessing PA  pressure.   Left Atrium: Left atrial size was moderately dilated.   Right Atrium: Right atrial size was normal in size.   Pericardium: There is no evidence of pericardial effusion.   Mitral Valve: The mitral valve is normal in structure. No evidence of  mitral valve regurgitation.   Tricuspid Valve: The tricuspid valve is normal in structure. Tricuspid  valve regurgitation is not demonstrated.   Aortic Valve: The aortic valve is tricuspid. Aortic valve regurgitation is  not visualized. No aortic stenosis is present.   Pulmonic Valve: The pulmonic valve was grossly normal. Pulmonic valve  regurgitation is trivial.   Aorta: Aortic dilatation noted. There is borderline dilatation of the  aortic root, measuring 38 mm.   Venous: The inferior vena cava is normal in size with greater than 50%  respiratory variability, suggesting right atrial pressure of 3 mmHg.   IAS/Shunts: No atrial level shunt detected by color flow Doppler.   Assessment & Plan   1.  Palpitations, VT-denies recent episodes of accelerated or irregular heartbeat.  Previously noted to have VT and was started on amiodarone  11/23.  Reports that he is aware of heartbeats in the evening (appear to be regular) which give him anxiety. Continue amiodarone, metoprolol Avoid triggers caffeine, chocolate, EtOH, dehydration etc. Follows with EP  Nonischemic cardiomyopathy, chronic systolic CHF-weight stable.  Continues to struggle to progress his physical activity.  Euvolemic today.  Echocardiogram 05/06/2022 showed EF of 30-35%, G1 DD, borderline aortic root dilation  measuring 38 mm, no significant valvular abnormalities. Continue  metoprolol,  Entresto, spironolactone Heart healthy low-sodium diet Increase physical activity as tolerated Follows with advanced heart failure clinic   Anxiety-reports following with Dr. Michail Sermon.  Was not able to tolerate Prozac. Mindfulness stress reduction sheet given Continue Ativan   Disposition: Follow-up with [name] in 6 months.   Jossie Ng. Juelz Whittenberg NP-C     07/08/2022, 3:41 PM [facility] [address] Office [phone] Fax [phone]    I spent 14 minutes examining this patient, reviewing medications, and using patient centered shared decision making involving her cardiac care.  Prior to her visit I spent greater than 20 minutes reviewing her past medical history,  medications, and prior cardiac tests.

## 2022-07-07 NOTE — Telephone Encounter (Signed)
Gladice from [employer] to speak someone from clinical staff about pt's entresto (sacubitril-valsartan (ENTRESTO) 24-26 MG )

## 2022-07-08 ENCOUNTER — Ambulatory Visit: Payer: Medicaid Other | Attending: General Practice | Admitting: General Practice

## 2022-07-08 ENCOUNTER — Encounter: Payer: Self-pay | Admitting: General Practice

## 2022-07-08 VITALS — BP 127/78 | HR 64 | Ht 72.0 in | Wt 262.2 lb

## 2022-07-08 DIAGNOSIS — I472 Ventricular tachycardia, unspecified: Secondary | ICD-10-CM

## 2022-07-08 DIAGNOSIS — F419 Anxiety disorder, unspecified: Secondary | ICD-10-CM

## 2022-07-08 DIAGNOSIS — R002 Palpitations: Secondary | ICD-10-CM | POA: Diagnosis not present

## 2022-07-08 DIAGNOSIS — I428 Other cardiomyopathies: Secondary | ICD-10-CM

## 2022-07-08 NOTE — Patient Instructions (Signed)
Medication Instructions:  The current medical regimen is effective;  continue present plan and medications as directed. Please refer to the Current Medication list given to you today.  *If you need a refill on your cardiac medications before your next appointment, please call your pharmacy*  Lab Work: NONE If you have labs (blood work) drawn today and your tests are completely normal, you will receive your results only by: [place] (if you have MyChart) OR A paper copy in the mail If you have any lab test that is abnormal or we need to change your treatment, we will call you to review the results.  Testing/Procedures: NONE  Follow-Up: At [facility], you and your health needs are our priority.  As part of our continuing mission to provide you with exceptional heart care, we have created designated Provider Care Teams.  These Care Teams include your primary Cardiologist (physician) and Advanced Practice Providers (APPs -  Physician Assistants and Nurse Practitioners) who all work together to provide you with the care you need, when you need it.  Your next appointment:   6 month(s)  Provider:   [name], MD    Other Instructions CONTINUE TO [place]

## 2022-07-10 ENCOUNTER — Ambulatory Visit (HOSPITAL_BASED_OUTPATIENT_CLINIC_OR_DEPARTMENT_OTHER): Payer: Medicaid Other | Admitting: Psychiatry

## 2022-07-10 DIAGNOSIS — F411 Generalized anxiety disorder: Secondary | ICD-10-CM

## 2022-07-10 DIAGNOSIS — F4310 Post-traumatic stress disorder, unspecified: Secondary | ICD-10-CM

## 2022-07-10 DIAGNOSIS — F410 Panic disorder [episodic paroxysmal anxiety] without agoraphobia: Secondary | ICD-10-CM | POA: Diagnosis not present

## 2022-07-10 MED ORDER — CLONAZEPAM 0.5 MG PO TABS: 0.5000 mg | ORAL_TABLET | Freq: Two times a day (BID) | ORAL | 0 refills | Status: DC

## 2022-07-10 MED ORDER — CYPROHEPTADINE HCL 4 MG PO TABS: 4.0000 mg | ORAL_TABLET | Freq: Every day | ORAL | 0 refills | Status: DC
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
## 2022-07-10 NOTE — Progress Notes (Signed)
This encounter was created in error - please disregard.

## 2022-07-11 ENCOUNTER — Encounter: Payer: Self-pay | Admitting: Internal Medicine

## 2022-07-11 ENCOUNTER — Other Ambulatory Visit: Payer: Self-pay | Admitting: Internal Medicine

## 2022-07-11 ENCOUNTER — Encounter (HOSPITAL_COMMUNITY): Payer: Self-pay | Admitting: Psychiatry

## 2022-07-11 MED ORDER — BENZONATATE 100 MG PO CAPS: 100.0000 mg | ORAL_CAPSULE | Freq: Three times a day (TID) | ORAL | 0 refills | Status: DC | PRN

## 2022-07-14 ENCOUNTER — Encounter: Payer: Self-pay | Admitting: Internal Medicine

## 2022-07-14 ENCOUNTER — Ambulatory Visit: Payer: Medicaid Other | Attending: Internal Medicine | Admitting: Internal Medicine

## 2022-07-14 ENCOUNTER — Telehealth (HOSPITAL_COMMUNITY): Payer: Self-pay | Admitting: *Deleted

## 2022-07-14 VITALS — BP 121/68 | HR 71 | Temp 98.3°F | Ht 72.0 in | Wt 271.0 lb

## 2022-07-14 DIAGNOSIS — F410 Panic disorder [episodic paroxysmal anxiety] without agoraphobia: Secondary | ICD-10-CM

## 2022-07-14 DIAGNOSIS — I5022 Chronic systolic (congestive) heart failure: Secondary | ICD-10-CM

## 2022-07-14 DIAGNOSIS — E669 Obesity, unspecified: Secondary | ICD-10-CM

## 2022-07-14 DIAGNOSIS — R0681 Apnea, not elsewhere classified: Secondary | ICD-10-CM | POA: Diagnosis not present

## 2022-07-14 DIAGNOSIS — J40 Bronchitis, not specified as acute or chronic: Secondary | ICD-10-CM | POA: Diagnosis not present

## 2022-07-14 DIAGNOSIS — E1169 Type 2 diabetes mellitus with other specified complication: Secondary | ICD-10-CM | POA: Diagnosis not present

## 2022-07-14 MED ORDER — LANTUS SOLOSTAR 100 UNIT/ML ~~LOC~~ SOPN: 32.0000 [IU] | PEN_INJECTOR | Freq: Every day | SUBCUTANEOUS | 1 refills | Status: DC

## 2022-07-14 MED ORDER — METHOCARBAMOL 500 MG PO TABS: ORAL_TABLET | ORAL | 1 refills | Status: DC

## 2022-07-14 MED ORDER — NOVOLOG FLEXPEN 100 UNIT/ML ~~LOC~~ SOPN: 11.0000 [IU] | PEN_INJECTOR | Freq: Two times a day (BID) | SUBCUTANEOUS | 2 refills | Status: DC

## 2022-07-14 MED ORDER — AMIODARONE HCL 200 MG PO TABS: 200.0000 mg | ORAL_TABLET | Freq: Every day | ORAL | 0 refills | Status: DC

## 2022-07-14 MED ORDER — FREESTYLE LITE TEST VI STRP: ORAL_STRIP | 12 refills | Status: DC

## 2022-07-14 MED ORDER — AMIODARONE HCL 200 MG PO TABS: 200.0000 mg | ORAL_TABLET | Freq: Every day | ORAL | 2 refills | Status: DC

## 2022-07-14 MED ORDER — DOXYCYCLINE HYCLATE 100 MG PO TABS: 100.0000 mg | ORAL_TABLET | Freq: Two times a day (BID) | ORAL | 0 refills | Status: DC
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
## 2022-07-14 NOTE — Telephone Encounter (Signed)
Pt called with concerns about prescribed medications. Pt first visit was on 07/10/22. Pt was started on Klonopin 0.5 mg BID and Periactin 4 mg QHS. Pt states that he Klonopin made him very depressed with frequent crying spells and the Periactin made his "heart race and see blue dots". Pt returns on 07/31/22 for next visit. Please review.

## 2022-07-15 ENCOUNTER — Ambulatory Visit (HOSPITAL_COMMUNITY)
Admission: RE | Admit: 2022-07-15 | Discharge: 2022-07-15 | Disposition: A | Discharge status: Home / Self Care | Payer: Medicaid Other | Source: Ambulatory Visit | Attending: Family Medicine | Admitting: Family Medicine

## 2022-07-15 ENCOUNTER — Ambulatory Visit (INDEPENDENT_AMBULATORY_CARE_PROVIDER_SITE_OTHER): Payer: Medicaid Other

## 2022-07-15 VITALS — BP 122/72 | HR 64

## 2022-07-15 DIAGNOSIS — R0602 Shortness of breath: Secondary | ICD-10-CM | POA: Insufficient documentation

## 2022-07-15 DIAGNOSIS — Z79899 Other long term (current) drug therapy: Secondary | ICD-10-CM | POA: Insufficient documentation

## 2022-07-15 DIAGNOSIS — E1122 Type 2 diabetes mellitus with diabetic chronic kidney disease: Secondary | ICD-10-CM | POA: Insufficient documentation

## 2022-07-15 DIAGNOSIS — N1832 Chronic kidney disease, stage 3b: Secondary | ICD-10-CM | POA: Insufficient documentation

## 2022-07-15 DIAGNOSIS — I472 Ventricular tachycardia, unspecified: Secondary | ICD-10-CM | POA: Diagnosis not present

## 2022-07-15 DIAGNOSIS — Z7984 Long term (current) use of oral hypoglycemic drugs: Secondary | ICD-10-CM | POA: Insufficient documentation

## 2022-07-15 DIAGNOSIS — I5022 Chronic systolic (congestive) heart failure: Secondary | ICD-10-CM | POA: Insufficient documentation

## 2022-07-15 DIAGNOSIS — F410 Panic disorder [episodic paroxysmal anxiety] without agoraphobia: Secondary | ICD-10-CM | POA: Insufficient documentation

## 2022-07-15 DIAGNOSIS — R5383 Other fatigue: Secondary | ICD-10-CM | POA: Diagnosis not present

## 2022-07-15 DIAGNOSIS — I428 Other cardiomyopathies: Secondary | ICD-10-CM | POA: Diagnosis not present

## 2022-07-15 LAB — BASIC METABOLIC PANEL
Anion gap: 10 (ref 5–15)
BUN: 19 mg/dL (ref 6–20)
CO2: 21 mmol/L — ABNORMAL LOW (ref 22–32)
Calcium: 9.3 mg/dL (ref 8.9–10.3)
Chloride: 108 mmol/L (ref 98–111)
Creatinine, Ser: 1.94 mg/dL — ABNORMAL HIGH (ref 0.61–1.24)
GFR, Estimated: 40 mL/min — ABNORMAL LOW (ref >60)
Glucose, Bld: 118 mg/dL — ABNORMAL HIGH (ref 70–99)
Potassium: 4.5 mmol/L (ref 3.5–5.1)
Sodium: 139 mmol/L (ref 135–145)

## 2022-07-15 LAB — BRAIN NATRIURETIC PEPTIDE: B Natriuretic Peptide: 30.5 pg/mL (ref 0.0–100.0)

## 2022-07-15 MED ORDER — ENTRESTO 49-51 MG PO TABS: 1.0000 | ORAL_TABLET | Freq: Two times a day (BID) | ORAL | 3 refills | Status: DC

## 2022-07-15 NOTE — Progress Notes (Signed)
Advanced Heart Failure Clinic Note   Referring Physician: [name], MD  Primary Care: [name], MD Primary Cardiologist: [name] EP: Dr. Quentin Ore HF Cardiologist: Dr. Daniel Nones  HPI:  [name] is a 57 y.o. male with HFrEF (dx 2015) w/ LHC demonstrating normal coronary anatomy s/p primary prevention ICD, CKDIIIB & T2DM. He has been followed outpatient by [name]. In 2017 he had a CPX with w/ moderate functional impairment, peak VO2 of 15, VE/VCO2 of 35. From a functional standpoint, [name] reports running 2-3 miles daily until he was diagnosed with 'long covid' early 2023. Since that time he has seen a sharp decline in exercise capacity. In November 2023, he was admitted for VT believed to be secondary to significant electrolyte derrangements from an underlying right gluteal abscess now s/p IV antibiotics. Throughout his admission he had recurrent device shocks for VT. He underwent RHC/LHC that demonstrated normal coronary anatomy with RHC showing normal cardiac output and low filling pressures. He subsequently underwent placement of right atrial lead with improvement in ectopy after atrial pacing. TTE during admission with LVEF of 30-35%.    [name] was last seen by Dr. Daniel Nones on 05/26/22 where he reported significant anxiety and panic attacks since discharge. He reported becoming short of breath after walking 50 feet but is unsure if this is due to underlying anxiety or heart failure.  He continued to sleep on 4-5 pillows at night because of fear of recurrent ICD shocks. At that visit, metoprolol succinate was increased to 50 mg daily, Wilder Glade was increased to 10 mg daily, and spironolactone was decreased to 12.5 mg daily for hyperkalemia. Since that visit, he presented to the ED on 05/28/22 with a panic attack, 06/14/22 with AKI and again 06/17/22 for a subsequent panic attack and noted SI for which his Prozac was discontinued and he was set up with a  psychiatrist for further medication management.   At last pharmacy visit on 06/23/22, he reported shortness of breath when doing activities like walking down his driveway, taking a shower, or even at rest occurs with rapid onset and offset 1-2 times per day. It had no precipitating factors and resolved within 1 minute. He was noted to be euvolemic appearing on exam and symptoms were thought secondary to anxiety. Entresto was increased to 49/51 mg BID.   At EP visit on 07/07/22, BP was noted to be 110/78 mmHg. Patient reported never increasing Entresto as instructed for fear of worsening renal function. His Entresto was decreased back to 24/26 mg BID at that time.  Today he returns to HF clinic for pharmacist medication titration. He reports overall feeling fine. He denies dizziness and lightheadedness,  but reports chronic fatigue: Reports moments of chest discomfort and occasional palpitations generally accompanied by anxiety. Continues to have moments of dyspnea that come and go rapidly without cause. Reports new sinus symptoms. PCP started him on doxycycline. He reports inability to lay flat due to shortness of breath since last hospitalization. Most likely anxiety-related. He is able to complete ADLs, but daily activity level is low and he endorses reduced tolerance to walking moderate distances without rest.  He rarely weighs himself at home. Denies LEE. Appetite comes and goes, currently good today. Adheres to low-salt diet. Feels bloated and heavy in his abdomen. Weight today is up 15 lbs. He has not taken Lasix since his last visit.  HF Medications: Metoprolol succinate 50 mg daily Entresto 24/26 mg twice daily Spironolactone 12.5  mg daily Farxiga 10 mg daily Furosemide 80 mg daily as needed  Has the patient been experiencing any side effects to the medications prescribed? No  Does the patient have any problems obtaining medications due to transportation or finances?   No; [facility]  Medicaid  Understanding of regimen: excellent Understanding of indications: excellent Potential of compliance: excellent Patient understands to avoid NSAIDs. Patient understands to avoid decongestants.    Pertinent Lab Values: Labs to be drawn today  Vital Signs: Weight: 270.6 lbs (last clinic weight: 256 lbs) Blood pressure: 122/72 mmHg Heart rate: 64   Assessment/Plan:   Heart failure with reduced EF, Stage C, NYHA IIB-III Etiology of HF: Nonischemic as demonstrated by coronary angiography; no family history. Per Dr. Daniel Nones, if he continues to have recurrent VT will consider evaluation for sarcoidosis.  NYHA class / AHA Stage:IIB-III, his symptoms appear mildly increased from previous. Symptoms of chest tightness, orthopnea and shortness of breath appear to be more related to anxiety, however HF may be contributing to his shortness of breath with walking/ADLs. He is up ~15 lbs from last visit. Weight at PCP also elevated. Volume status & Diuretics: Take Lasix 80 mg today and tomorrow. Continue Lasix 80 mg daily PRN. During inpatient RHC when attempting to lay flat, he experienced SOB, however filling pressures were low. Previous symptoms are most likely due to anxiety given their unusual presentation. He has experienced weight gain of ~15 lbs since his last visit, increased burden of symptoms with activity and abdominal bloating. Will order BMET and BNP today. Vasodilators: Increase Entresto to 49/51 mg twice daily. Spoke extensively with the patient regarding increasing his Entresto. He did initially mention his concern about progression of his CKD. Explained the pathophysiology of CKD and how ARBs have recommendations from ADA and [place] to prevent progression of CKD in addition to their HF benefits. He is now eager to try the increased dose. Will monitor labs closely, including baseline BMET today and in 3 weeks. Beta-Blocker: Continue metoprolol succinate 50 mg  daily. MRA: Continue spironolactone 12.5 mg daily. Potassium and creatinine have improved to baseline after decreasing spironolactone and stopping potassium supplementation. Cardiometabolic: Continue Farxiga 10 mg daily Devices therapies & Valvulopathies: dcICD Advanced therapies: CPX in 2017 peak VO2 of 15 AND ve/vco2 of 35 but with submaximal effort; consistent with moderate HF limitations. Advanced therapies at this time limited by CKD and social barriers. He was recommended to start cardiac rehab, but has not scheduled anything this far. He was provided with the phone number to arrange cardiac rehab.   2. VT - No recurrence since November hospitalization. Remains on amiodarone 200 mg daily.   3. T2DM - Taking Farxiga and insulin  - Followed by his PCP   4. CKD3B - Continue Farxiga and Entresto   5. Panic attacks - Prozac held by primary care pending follow up  with psychiatry.   Follow up in 1 month with Dr. Daniel Nones.    Audry Riles, PharmD, BCPS, [facility], CPP Heart Failure Clinic Pharmacist [phone]

## 2022-07-15 NOTE — Patient Instructions (Signed)
It was a pleasure seeing you today!  MEDICATIONS: -We are changing your medications today -Increase Entresto to 49/51 mg twice daily -Take Lasix 80 mg today and tomorrow, then resume as needed -Call if you have questions about your medications.  LABS: -We will call you if your labs need attention.  NEXT APPOINTMENT: Return to clinic in 3 weeks for labs.  In general, to take care of your heart failure: -Limit your fluid intake to 2 Liters (half-gallon) per day.   -Limit your salt intake to ideally 2-3 grams (2000-3000 mg) per day. -Weigh yourself daily and record, and bring that "weight diary" to your next appointment.  (Weight gain of 2-3 pounds in 1 day typically means fluid weight.) -The medications for your heart are to help your heart and help you live longer.   -Please contact us before stopping any of your heart medications.  Call the clinic at [phone] with questions or to reschedule future appointments.

## 2022-07-18 LAB — CUP PACEART REMOTE DEVICE CHECK
Battery Remaining Longevity: 97 mo
Battery Remaining Percentage: 92 %
Battery Voltage: 3.02 V
Brady Statistic AP VP Percent: 1 %
Brady Statistic AP VS Percent: 72 %
Brady Statistic AS VP Percent: 1 %
Brady Statistic AS VS Percent: 28 %
Brady Statistic RA Percent Paced: 72 %
Brady Statistic RV Percent Paced: 1 %
Date Time Interrogation Session: 20240209092719
HighPow Impedance: 64 Ohm
Implantable Lead Connection Status: 753985
Implantable Lead Connection Status: 753985
Implantable Lead Implant Date: 20150717
Implantable Lead Implant Date: 20231207
Implantable Lead Location: 753859
Implantable Lead Location: 753860
Implantable Pulse Generator Implant Date: 20231207
Lead Channel Impedance Value: 350 Ohm
Lead Channel Impedance Value: 390 Ohm
Lead Channel Pacing Threshold Amplitude: 0.625 V
Lead Channel Pacing Threshold Amplitude: 0.75 V
Lead Channel Pacing Threshold Pulse Width: 0.5 ms
Lead Channel Pacing Threshold Pulse Width: 0.5 ms
Lead Channel Sensing Intrinsic Amplitude: 12 mV
Lead Channel Sensing Intrinsic Amplitude: 2.8 mV
Lead Channel Setting Pacing Amplitude: 1.625
Lead Channel Setting Pacing Amplitude: 2 V
Lead Channel Setting Pacing Pulse Width: 0.5 ms
Lead Channel Setting Sensing Sensitivity: 0.5 mV
Pulse Gen Serial Number: 810051574
Zone Setting Status: 755011

## 2022-07-23 ENCOUNTER — Ambulatory Visit (INDEPENDENT_AMBULATORY_CARE_PROVIDER_SITE_OTHER): Payer: Medicaid Other | Admitting: Pulmonary Disease

## 2022-07-23 ENCOUNTER — Encounter: Payer: Self-pay | Admitting: Pulmonary Disease

## 2022-07-23 ENCOUNTER — Ambulatory Visit (INDEPENDENT_AMBULATORY_CARE_PROVIDER_SITE_OTHER): Payer: Medicaid Other

## 2022-07-23 VITALS — BP 122/88 | HR 65 | Temp 98.2°F | Ht 72.0 in | Wt 271.0 lb

## 2022-07-23 DIAGNOSIS — R0600 Dyspnea, unspecified: Secondary | ICD-10-CM

## 2022-07-23 DIAGNOSIS — I428 Other cardiomyopathies: Secondary | ICD-10-CM | POA: Diagnosis not present

## 2022-07-23 NOTE — Patient Instructions (Signed)
HFrEF with acute exacerbation: BMET BNP CXR Ambulatory oximetry testing Take furosemide 21m bid x 3 days Weigh yourself daily Goal weight is 255 pounds Keep sodium intake less than 2gm/daily Keep fluid intake < 2L daily  Chronic bronchitis Use duoneb prn Needs repeat PFT this year Stay away from cigarettes  Follow up in 1 week with a nurse practitioner, sooner if needed

## 2022-07-23 NOTE — Progress Notes (Unsigned)
Synopsis: Referred in October 2023 for chest congestion after having COVID.  Has a history of systolic heart failure from nonischemic cardiomyopathy followed by [name]. Previously followed by Dr. Lenna Gilford for chronic bronchitis and Dr. Corrie Dandy for recurrent bronchitis and OSA.  Subjective:   PATIENT ID: [name] GENDER: male DOB: 1965-10-16, MRN: [id number]   HPI  Chief Complaint  Patient presents with   Follow-up    Pt has been gasping for air, occasionally. Pt states he gasped 21 times yesterday and today 3 times.     Unfortunately Mr. Lanoue had a cardiac arrest a few months after we saw him last in the setting of his systolic heart failure.  His ICD was adjusted, new lead placed.  He was found to be hypokalemic.  He was discharged after 15 days in the hospital during that time he had a left heart catheterization which showed nonobstructive coronary disease.  He had a device upgrade with an atrial lead placed on December 7.  He was treated with amiodarone.  It was felt that hypokalemia contributed to the cardiac arrest/VT storm.  Unfortunately he had ventricular tachycardia.  Since then he has suffered with anxiety and some panic attacks.  He has been seen in the emergency room a few times, once admitted again in January for acute kidney injury.  He returns to clinic today saying that at 1 point in the last few weeks he did have an episode of bronchitis and was treated with antibiotics.  He feels that his cough and congestion may be up slightly compared to baseline but is not consistent with prior episodes of bronchitis flares.  He says however that he has gained about 15 to 20 pounds in the last few weeks and he can feel growth in his abdomen.  He is feeling more short of breath and is gasping for breath more frequently.  He says that he has been taking his furosemide but somewhat sporadically.  Today for example he did not take it.  When he does take the furosemide it makes  him  Past Medical History:  Diagnosis Date   AICD (automatic cardioverter/defibrillator) present    Chronic bronchitis ([place])    "get it most q yr" (12/23/2013)   Chronic systolic (congestive) heart failure ([place])    Fracture of left humerus    a. 07/2013.   GERD (gastroesophageal reflux disease)    not at present time   Heart murmur    "born w/it"    History of renal calculi    Hypertension    Kidney stones    NICM (nonischemic cardiomyopathy) ([place])    a. 07/2013 Echo: EF 20-25%, diff HK, Gr2 DD, mild MR, mod dil LA/RA. 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  HENT:  Negative for congestion, sinus pain and sore throat.   Respiratory:  Positive for shortness of breath. Negative for cough and sputum production.   Cardiovascular:  Positive for orthopnea and PND. Negative for chest pain and leg swelling.      Objective:  Physical Exam   Vitals:   07/23/22 1611  BP: 122/88  Pulse: 65  Temp: 98.2 F (  36.8 C)  TempSrc: Oral  SpO2: 97%  Weight: 271 lb (122.9 kg)  Height: 6' (1.829 m)    Gen: well appearing HENT: OP clear, neck supple PULM: Crackles bases B, normal effort  CV: RRR, no mgr GI: BS+, soft, nontender Derm: no cyanosis or rash Psyche: anxious mood and affect    CBC    Component Value Date/Time   WBC 8.3 06/14/2022 1820   RBC 4.55 06/14/2022 1820   HGB 13.6 06/14/2022 1820   HGB 14.0 08/26/2021 1608   HCT 41.0 06/14/2022 1820   HCT 40.9 08/26/2021 1608   PLT 335 06/14/2022 1820   PLT 286 08/26/2021 1608   MCV 90.1 06/14/2022 1820   MCV 88 08/26/2021 1608   MCH 29.9 06/14/2022 1820   MCHC 33.2 06/14/2022 1820   RDW 14.3 06/14/2022 1820   RDW 13.0  08/26/2021 1608   LYMPHSABS 2.6 06/14/2022 1820   LYMPHSABS 1.7 06/28/2018 1440   MONOABS 0.5 06/14/2022 1820   EOSABS 0.1 06/14/2022 1820   EOSABS 0.3 06/28/2018 1440   BASOSABS 0.1 06/14/2022 1820   BASOSABS 0.0 06/28/2018 1440     Chest imaging: March 04, 2022 chest x-ray images independently reviewed showing normal pulmonary parenchyma, enlarged cardiac silhouette, ICD implant  PFT: 2017 PFT ratio 73%, FEV1 2.36 L 64% TLC 5.68 L 75% predicted (ERV elevated), DLCO 19.77 mL/min/mmHg 54% predicted.  Shape of flow volume loop suggestive of variable upper ariway obstruction  Labs:  Path:  Echo:  Heart Catheterization:       Assessment & Plan:   Dyspnea, unspecified type - Plan: B Nat Peptide, Basic Metabolic Panel (BMET), DG Chest 2 View, Pulmonary function test  Nonischemic cardiomyopathy ([place])  Discussion: Chon has had a difficult time since his last visit here.  He has  chronic bronchitis with preserved airflow, but needs repeat PFT's at some point to ensure he hasn't progressed to COPD.  His issues most recently have been cardiac, specifically VT storm and CHF problems.  He now has orthopnea, dyspnea and 15-20 pound weight gain.  This is worrisome for a CHF exacerbation.   HFrEF with acute exacerbation: BMET BNP CXR Ambulatory oximetry testing Take furosemide 72m bid x 3 days Weigh yourself daily Goal weight is 255 pounds Keep sodium intake less than 2gm/daily Keep fluid intake < 2L daily  Chronic bronchitis Use duoneb prn Needs repeat PFT this year Stay away from cigarettes  Follow up in 1 week with a nurse practitioner, sooner if needed   Immunizations: Immunization History  Administered Date(s) Administered   PFIZER(Purple Top)SARS-COV-2 Vaccination 10/21/2019     Current Outpatient Medications:    Accu-Chek Softclix Lancets lancets, Use as instructed, Disp: 100 each, Rfl: 12   acetaminophen (TYLENOL) 500 MG tablet, Take 1,000 mg by mouth 2  (two) times daily., Disp: , Rfl:    amiodarone (PACERONE) 200 MG tablet, Take 1 tablet (200 mg total) by mouth daily., Disp: 30 tablet, Rfl: 2   aspirin 81 MG tablet, Take 81 mg by mouth daily., Disp: , Rfl:    atorvastatin (LIPITOR) 10 MG tablet, Take 1 tablet (10 mg total) by mouth daily., Disp: 90 tablet, Rfl: 1   benzonatate (TESSALON PERLES) 100 MG capsule, Take 1 capsule (100 mg total) by mouth 3 (three) times daily as needed for cough., Disp: 20 capsule, Rfl: 0   Blood Glucose Monitoring Suppl (ACCU-CHEK GUIDE) w/Device KIT, UAD, Disp: 1 kit, Rfl: 0   cetirizine (ZYRTEC) 10 MG tablet, Take 1 tablet (10  mg total) by mouth daily., Disp: 90 tablet, Rfl: 1   cholecalciferol (VITAMIN D3) 25 MCG (1000 UNIT) tablet, Take 1,000 Units by mouth daily., Disp: , Rfl:    clobetasol ointment (TEMOVATE) [id number] %, Apply 1 Application topically 2 (two) times daily. (Patient taking differently: Apply 1 Application topically daily as needed (for eczema).), Disp: 30 g, Rfl: 3   Continuous Blood Gluc Sensor (FREESTYLE LIBRE 2 SENSOR) MISC, Use to check blood sugar TID. Change sensor once every 14 days. E11.65, Disp: 2 each, Rfl: 11   dapagliflozin propanediol (FARXIGA) 10 MG TABS tablet, Take 1 tablet (10 mg total) by mouth daily before breakfast., Disp: 90 tablet, Rfl: 3   doxycycline (VIBRA-TABS) 100 MG tablet, Take 1 tablet (100 mg total) by mouth 2 (two) times daily., Disp: 14 tablet, Rfl: 0   EPINEPHrine 0.3 mg/0.3 mL IJ SOAJ injection, Inject 0.3 mg into the muscle as needed., Disp: 1 each, Rfl: 1   famotidine (PEPCID) 20 MG tablet, Take 1 tablet by mouth 1-2 times a day as needed. (Patient taking differently: Take 20 mg by mouth 2 (two) times daily as needed for heartburn or indigestion.), Disp: 90 tablet, Rfl: 1   fluticasone (FLONASE) 50 MCG/ACT nasal spray, Use 1-2 sprays in each nostril once a day as needed for nasal congestion., Disp: 16 g, Rfl: 5   furosemide (LASIX) 80 MG tablet, Take 1 tablet (80 mg  total) by mouth daily as needed for edema or fluid., Disp: 180 tablet, Rfl: 1   gabapentin (NEURONTIN) 300 MG capsule, TAKE 1 CAPSULE BY MOUTH EVERY DAY AT BEDTIME (Patient taking differently: Take 300 mg by mouth at bedtime.), Disp: 90 capsule, Rfl: 1   glucose blood (ACCU-CHEK GUIDE) test strip, Use as instructed, Disp: 100 each, Rfl: 12   glucose blood (FREESTYLE LITE) test strip, Use as instructed.  Please make sure this is compatible with [facility] 2 reader., Disp: 100 each, Rfl: 12   insulin aspart (NOVOLOG FLEXPEN) 100 UNIT/ML FlexPen, Inject 11 Units into the skin 2 (two) times daily before a meal., Disp: 15 mL, Rfl: 2   insulin glargine (LANTUS SOLOSTAR) 100 UNIT/ML Solostar Pen, Inject 32 Units into the skin daily., Disp: 15 mL, Rfl: 1   Insulin Pen Needle (PEN NEEDLES) 31G X 8 MM MISC, UAD, Disp: 100 each, Rfl: 6   ipratropium-albuterol (DUONEB) 0.5-2.5 (3) MG/3ML SOLN, Take 3 mLs by nebulization every 6 (six) hours as needed., Disp: 360 mL, Rfl: 2   Lancet Devices (ACCU-CHEK SOFTCLIX) lancets, 1 each by Other route 3 (three) times daily., Disp: 1 each, Rfl: 0   methocarbamol (ROBAXIN) 500 MG tablet, TAKE 1 TABLET BY MOUTH ONCE DAILY AS NEEDED FOR MUSCLE SPASM, Disp: 30 tablet, Rfl: 1   metoprolol succinate (TOPROL-XL) 50 MG 24 hr tablet, Take 1 tablet (50 mg total) by mouth daily., Disp: 90 tablet, Rfl: 3   sacubitril-valsartan (ENTRESTO) 49-51 MG, Take 1 tablet by mouth 2 (two) times daily., Disp: 180 tablet, Rfl: 3   spironolactone (ALDACTONE) 25 MG tablet, Take 0.5 tablets (12.5 mg total) by mouth daily., Disp: 30 tablet, Rfl: 5   tamsulosin (FLOMAX) 0.4 MG CAPS capsule, Take 2 capsules (0.8 mg total) by mouth daily., Disp: 180 capsule, Rfl: 1   triamcinolone cream (KENALOG) 0.1 %, Apply sparingly to red itchy areas twice daily as needed. Avoid face, neck, armpits or groin area. Do not use more than 3 weeks in a row., Disp: 45 g, Rfl: 5   albuterol (VENTOLIN HFA) 108 (  90 Base) MCG/ACT  inhaler, Inhale into the lungs every 6 (six) hours as needed for wheezing or shortness of breath. (Patient not taking: Reported on 07/15/2022), Disp: , Rfl:    cyproheptadine (PERIACTIN) 4 MG tablet, Take 1 tablet (4 mg total) by mouth at bedtime. (Patient not taking: Reported on 07/23/2022), Disp: 30 tablet, Rfl: 0   sildenafil (VIAGRA) 100 MG tablet, Take 1-2 tabs PO 1/2-1 hr prior to intercourse. (Patient not taking: Reported on 07/15/2022), Disp: 20 tablet, Rfl: 4

## 2022-07-24 ENCOUNTER — Telehealth: Payer: Self-pay | Admitting: Cardiology

## 2022-07-24 LAB — BASIC METABOLIC PANEL
BUN: 29 mg/dL — ABNORMAL HIGH (ref 6–23)
CO2: 24 mEq/L (ref 19–32)
Calcium: 9.9 mg/dL (ref 8.4–10.5)
Chloride: 107 mEq/L (ref 96–112)
Creatinine, Ser: 2.2 mg/dL — ABNORMAL HIGH (ref 0.40–1.50)
GFR: 32.38 mL/min — ABNORMAL LOW (ref >60.00)
Glucose, Bld: 153 mg/dL — ABNORMAL HIGH (ref 70–99)
Potassium: 4.5 mEq/L (ref 3.5–5.1)
Sodium: 142 mEq/L (ref 135–145)

## 2022-07-24 LAB — BRAIN NATRIURETIC PEPTIDE: Pro B Natriuretic peptide (BNP): 31 pg/mL (ref 0.0–100.0)

## 2022-07-24 NOTE — Telephone Encounter (Signed)
Patient states he received some test results and he doesn't know what they are.  He received them through [place].

## 2022-07-24 NOTE — Telephone Encounter (Signed)
Left message for patient informing him no recent results have been posted to his chart. Provided office number for callback.

## 2022-07-25 ENCOUNTER — Telehealth (HOSPITAL_COMMUNITY): Payer: Self-pay | Admitting: Pharmacist

## 2022-07-25 NOTE — Telephone Encounter (Signed)
Spoke with patient regarding recent labs. Potassium is stable at 4.5 and creatinine had minor elevations with 13% increase, well within the expected range from increasing Entresto. Okay to continue current treatment and recheck BMET at next appointment with cardiology on 2/26 or 2/27. Patient is comfortable with continuing Entresto at current dose for now given expected benefits to slowing CKD progression and improvement in HF.

## 2022-07-25 NOTE — Telephone Encounter (Signed)
Spoke with pt, aware lab work 07/23/22 was what went to his my chart. The patient reports his entresto was recently increased. According to the chart dr Lake Bells his pulmonary doctor drew the lab work. The patient is concerned about his kidney function and would like this lab work sent to the heart failure clinic to review and advise. This telephone note is being sent to the heart failure clinic per patient request.

## 2022-07-26 ENCOUNTER — Other Ambulatory Visit: Payer: Self-pay | Admitting: Internal Medicine

## 2022-07-28 NOTE — Telephone Encounter (Signed)
Requested medications are due for refill today.  yes  Requested medications are on the active medications list.  yes  Last refill.   Future visit scheduled.   yes  Notes to clinic.  Pharmacy comment: please send alternative. freestle lite not covered.     Requested Prescriptions  Pending Prescriptions Disp Refills   FREESTYLE LITE test strip [Pharmacy Med Name: FREESTYLE LITE      TES] 100 each 12    Sig: USE AS DIRECTED PLEASE MAKE SURE THIS IS COMPATIBLE WITH [place] 2 READER     Endocrinology: Diabetes - Testing Supplies Passed - 07/26/2022  9:02 AM      Passed - Valid encounter within last 12 months    Recent Outpatient Visits           2 weeks ago Type 2 diabetes mellitus with obesity ([place])   [place] [name], MD   1 month ago Suicidal ideation   [place], MD   1 month ago Panic attacks   [place] Karle Plumber B, MD   3 months ago Type 2 diabetes mellitus with hyperglycemia, with long-term current use of insulin [facility])   [place], [place] L, RPH-CPP   4 months ago Type 2 diabetes mellitus with hyperglycemia, with long-term current use of insulin [facility])   [place], RPH-CPP       Future Appointments             In 2 months Wynetta Emery, Dalbert Batman, MD [place]

## 2022-07-31 ENCOUNTER — Telehealth (HOSPITAL_BASED_OUTPATIENT_CLINIC_OR_DEPARTMENT_OTHER): Payer: Medicaid Other | Admitting: Psychiatry

## 2022-07-31 ENCOUNTER — Encounter (HOSPITAL_COMMUNITY): Payer: Self-pay | Admitting: Psychiatry

## 2022-07-31 ENCOUNTER — Other Ambulatory Visit: Payer: Self-pay | Admitting: Internal Medicine

## 2022-07-31 ENCOUNTER — Encounter: Payer: Self-pay | Admitting: Internal Medicine

## 2022-07-31 DIAGNOSIS — N138 Other obstructive and reflux uropathy: Secondary | ICD-10-CM

## 2022-07-31 DIAGNOSIS — F410 Panic disorder [episodic paroxysmal anxiety] without agoraphobia: Secondary | ICD-10-CM

## 2022-07-31 DIAGNOSIS — E1169 Type 2 diabetes mellitus with other specified complication: Secondary | ICD-10-CM

## 2022-07-31 DIAGNOSIS — F4310 Post-traumatic stress disorder, unspecified: Secondary | ICD-10-CM

## 2022-07-31 DIAGNOSIS — F411 Generalized anxiety disorder: Secondary | ICD-10-CM

## 2022-07-31 MED ORDER — TAMSULOSIN HCL 0.4 MG PO CAPS: 0.8000 mg | ORAL_CAPSULE | Freq: Every day | ORAL | 1 refills | Status: DC

## 2022-07-31 MED ORDER — CLONAZEPAM 0.5 MG PO TABS: 0.5000 mg | ORAL_TABLET | Freq: Two times a day (BID) | ORAL | 0 refills | Status: DC | PRN

## 2022-07-31 MED ORDER — ATORVASTATIN CALCIUM 10 MG PO TABS: 10.0000 mg | ORAL_TABLET | Freq: Every day | ORAL | 1 refills | Status: DC
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
## 2022-08-04 ENCOUNTER — Ambulatory Visit: Payer: Medicaid Other | Attending: Cardiology

## 2022-08-04 DIAGNOSIS — I5022 Chronic systolic (congestive) heart failure: Secondary | ICD-10-CM | POA: Diagnosis not present

## 2022-08-04 DIAGNOSIS — N1832 Chronic kidney disease, stage 3b: Secondary | ICD-10-CM
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
## 2022-08-05 ENCOUNTER — Ambulatory Visit (HOSPITAL_COMMUNITY)
Admission: RE | Admit: 2022-08-05 | Discharge: 2022-08-05 | Disposition: A | Discharge status: Home / Self Care | Payer: Medicaid Other | Source: Ambulatory Visit | Attending: Cardiology | Admitting: Cardiology

## 2022-08-05 DIAGNOSIS — I5022 Chronic systolic (congestive) heart failure: Secondary | ICD-10-CM

## 2022-08-05 LAB — BASIC METABOLIC PANEL
Anion gap: 11 (ref 5–15)
BUN: 22 mg/dL — ABNORMAL HIGH (ref 6–20)
CO2: 17 mmol/L — ABNORMAL LOW (ref 22–32)
Calcium: 8.6 mg/dL — ABNORMAL LOW (ref 8.9–10.3)
Chloride: 113 mmol/L — ABNORMAL HIGH (ref 98–111)
Creatinine, Ser: 2.14 mg/dL — ABNORMAL HIGH (ref 0.61–1.24)
GFR, Estimated: 35 mL/min — ABNORMAL LOW (ref >60)
Glucose, Bld: 138 mg/dL — ABNORMAL HIGH (ref 70–99)
Potassium: 4.4 mmol/L (ref 3.5–5.1)
Sodium: 141 mmol/L (ref 135–145)

## 2022-08-11 ENCOUNTER — Ambulatory Visit: Payer: Medicaid Other | Attending: Cardiology

## 2022-08-11 DIAGNOSIS — I5022 Chronic systolic (congestive) heart failure: Secondary | ICD-10-CM

## 2022-08-11 DIAGNOSIS — Z95810 Presence of automatic (implantable) cardiac defibrillator: Secondary | ICD-10-CM

## 2022-08-12 NOTE — Progress Notes (Signed)
EPIC Encounter for ICM Monitoring  Patient Name: [name] is a 57 y.o. male Date: 08/12/2022 Primary Care Physican: [name], MD Primary Cardiologist: Stanford Breed Electrophysiologist: Quentin Ore 06/17/2022 Office Weight: 251 lbs 08/04/2022 Weight: 271 lbs 08/12/2022 Weight: 271 lbs                            Spoke with patient and heart failure questions reviewed.  Transmission results reviewed.  Pt reports abdominal bloating has improved.    Diet: Frequently eating restaurant foods.     Corvue thoracic impedance suggesting possible fluid accumulation starting 2/20 and returned close to baseline 3/4.   Prescribed dosage:  Furosemide 80 mg take 1 tablet (80 mg total) as needed daily for fluid.    Potassium 20 mEq take 1 tablet by mouth twice a day Spironolactone 25 mg take 1 tablet (0.5 mg total) by mouth once a day.    Labs: 08/05/2022 BMET scheduled  06/16/2022 Creatinine 2.07, BUN 23, Potassium 4.1, Sodium 138, GFR 37  06/15/2022 Creatinine 2.33, BUN 25, Potassium 4.1, Sodium 137, GFR 32  06/14/2022 Creatinine 2.65, BUN 19, Potassium 4.4, Sodium 136, GFR 27  05/28/2022 Creatinine 2.22, BUN 25, Potassium 5.7, Sodium 137, GFR 34 05/26/2022 Creatinine 2.12, BUN 18, Potassium 5.0, Sodium 136, GFR 36  05/23/2022 Creatinine 2.02, BUN 19, Potassium 5.4, Sodium 140  05/17/2022 Creatinine 1.85, BUN 30, Potassium 5.4, Sodium 140, GFR 42  05/16/2022 Creatinine 1.85, BUN 31, Potassium 4.8, Sodium 136, GFR 42  A complete set of results can be found in Results Review.   Recommendations:  Pt has 2 device checks next week and recommendations will be provide at the [facility].     Follow-up plan: ICM clinic phone appointment on 09/01/2022 to recheck fluid levels.   91 day device clinic remote transmission 10/14/2022.     EP/Cardiology Office Visits:  08/19/2022 with Dr Quentin Ore.  08/22/2022 with Dr Daniel Nones.   Copy of ICM check sent to Dr Quentin Ore.   3 month ICM trend: 08/11/2022.    12-14 Month  ICM trend:     [name], RN 08/12/2022 4:35 PM

## 2022-08-12 NOTE — Progress Notes (Signed)
Remote ICD transmission.   

## 2022-08-15 MED ORDER — AMIODARONE HCL 200 MG PO TABS: 200.0000 mg | ORAL_TABLET | Freq: Every day | ORAL | 3 refills | Status: DC

## 2022-08-19 ENCOUNTER — Ambulatory Visit: Payer: Medicaid Other | Attending: Cardiology | Admitting: Cardiology

## 2022-08-19 ENCOUNTER — Ambulatory Visit: Payer: Medicaid Other

## 2022-08-19 ENCOUNTER — Encounter: Payer: Self-pay | Admitting: Cardiology

## 2022-08-19 VITALS — BP 142/88 | HR 87 | Ht 72.0 in | Wt 275.0 lb

## 2022-08-19 DIAGNOSIS — I428 Other cardiomyopathies: Secondary | ICD-10-CM

## 2022-08-19 DIAGNOSIS — Z95810 Presence of automatic (implantable) cardiac defibrillator: Secondary | ICD-10-CM | POA: Diagnosis not present

## 2022-08-19 DIAGNOSIS — I5022 Chronic systolic (congestive) heart failure: Secondary | ICD-10-CM

## 2022-08-19 DIAGNOSIS — I472 Ventricular tachycardia, unspecified: Secondary | ICD-10-CM | POA: Diagnosis not present

## 2022-08-19 DIAGNOSIS — Z79899 Other long term (current) drug therapy: Secondary | ICD-10-CM | POA: Diagnosis not present

## 2022-08-19 NOTE — Patient Instructions (Signed)
Medication Instructions:  Your physician recommends that you continue on your current medications as directed. Please refer to the Current Medication list given to you today. *If you need a refill on your cardiac medications before your next appointment, please call your pharmacy*   Follow-Up: At [facility], you and your health needs are our priority.  As part of our continuing mission to provide you with exceptional heart care, we have created designated Provider Care Teams.  These Care Teams include your primary Cardiologist (physician) and Advanced Practice Providers (APPs -  Physician Assistants and Nurse Practitioners) who all work together to provide you with the care you need, when you need it.  Dr Quentin Ore wants you to follow up with the [facility] next appointment:   6 month(s)  Provider:   Lars Mage, MD

## 2022-08-19 NOTE — Progress Notes (Signed)
  Electrophysiology Office Follow up Visit Note:    Date:  08/19/2022   ID:  [name], DOB 05-09-1966, MRN [id number]  PCP:  [name], MD  [facility] Cardiologist:  [name], MD  [facility] Electrophysiologist:  Vickie Epley, MD    Interval History:    [name] is a 57 y.o. male who presents for a follow up visit.   He underwent a right atrial lead revision in December 2023. He has been on amiodarone since November for VT.  This seems to have his arrhythmia under control. He is doing better since leaving the hospital but is still not where he wants to be physically.  He feels short of breath with minimal exertion such as walking to his mailbox.  He feels shortness of breath when he takes a shower.  No passing out or high-voltage therapies from his ICD.     Past medical, surgical, social and family history were reviewed.  ROS:   Please see the history of present illness.    All other systems reviewed and are negative.  EKGs/Labs/Other Studies Reviewed:    The following studies were reviewed today:  August 19, 2022 in clinic device interrogation personally reviewed Battery longevity 8.4 years Lead parameter stable Atrial pacing 69% Ventricular pacing less than 1% Relatively flat rate histogram Made rate response more responsive today No high-voltage therapies   Physical Exam:    VS:  BP (!) 142/88   Pulse 87   Ht 6' (1.829 m)   Wt 275 lb (124.7 kg)   SpO2 95%   BMI 37.30 kg/m     Wt Readings from Last 3 Encounters:  08/19/22 275 lb (124.7 kg)  07/23/22 271 lb (122.9 kg)  07/14/22 271 lb (122.9 kg)     GEN:  Well nourished, well developed in no acute distress CARDIAC: RRR, no murmurs, rubs, gallops.  ICD pocket well-healed RESPIRATORY:  Clear to auscultation without rales, wheezing or rhonchi       ASSESSMENT:    1. Chronic systolic congestive heart failure ([place])   2. ICD (implantable cardioverter-defibrillator) in  place   3. NICM (nonischemic cardiomyopathy) ([place])   4. Encounter for long-term (current) use of high-risk medication   5. VT (ventricular tachycardia) (H[facility])    PLAN:    In order of problems listed above:   #Chronic systolic heart failure NYHA class II-III.  Warm and dry on exam. Follows with [name].  He is also seen Dr. Daniel Nones in the past from the heart failure team, I'd like to get him back in with their clinic.  #ICD in situ #History of ventricular tachycardia #High risk med monitoring-amiodarone ICD functioning appropriately, continue remote monitoring Amiodarone lab work to be repeated today.  If stable, can recheck in 6 months.   Follow up 6 months with APP.   Signed, Lars Mage, MD, [facility], [facility] 08/19/2022 3:58 PM    Electrophysiology [facility]

## 2022-08-20 LAB — TSH: TSH: 0.93 u[IU]/mL (ref 0.450–4.500)

## 2022-08-20 LAB — HEPATIC FUNCTION PANEL
ALT: 11 IU/L (ref 0–44)
AST: 12 IU/L (ref 0–40)
Albumin: 4.3 g/dL (ref 3.8–4.9)
Alkaline Phosphatase: 58 IU/L (ref 44–121)
Bilirubin Total: 0.4 mg/dL (ref 0.0–1.2)
Bilirubin, Direct: 0.13 mg/dL (ref 0.00–0.40)
Total Protein: 7.1 g/dL (ref 6.0–8.5)

## 2022-08-22 ENCOUNTER — Encounter (HOSPITAL_COMMUNITY): Payer: Self-pay | Admitting: Cardiology

## 2022-08-22 ENCOUNTER — Ambulatory Visit (HOSPITAL_BASED_OUTPATIENT_CLINIC_OR_DEPARTMENT_OTHER)
Admission: RE | Admit: 2022-08-22 | Discharge: 2022-08-22 | Disposition: A | Discharge status: Home / Self Care | Payer: Medicaid Other | Source: Ambulatory Visit | Attending: Cardiology | Admitting: Cardiology

## 2022-08-22 ENCOUNTER — Ambulatory Visit (HOSPITAL_COMMUNITY)
Admission: RE | Admit: 2022-08-22 | Discharge: 2022-08-22 | Disposition: A | Discharge status: Home / Self Care | Payer: Medicaid Other | Source: Ambulatory Visit | Attending: Cardiology | Admitting: Cardiology

## 2022-08-22 VITALS — BP 110/70 | HR 64 | Wt 279.0 lb

## 2022-08-22 DIAGNOSIS — F1290 Cannabis use, unspecified, uncomplicated: Secondary | ICD-10-CM | POA: Diagnosis not present

## 2022-08-22 DIAGNOSIS — L03317 Cellulitis of buttock: Secondary | ICD-10-CM | POA: Diagnosis not present

## 2022-08-22 DIAGNOSIS — Z7951 Long term (current) use of inhaled steroids: Secondary | ICD-10-CM | POA: Insufficient documentation

## 2022-08-22 DIAGNOSIS — Z7984 Long term (current) use of oral hypoglycemic drugs: Secondary | ICD-10-CM | POA: Insufficient documentation

## 2022-08-22 DIAGNOSIS — Z87891 Personal history of nicotine dependence: Secondary | ICD-10-CM | POA: Diagnosis not present

## 2022-08-22 DIAGNOSIS — Z79899 Other long term (current) drug therapy: Secondary | ICD-10-CM | POA: Insufficient documentation

## 2022-08-22 DIAGNOSIS — I130 Hypertensive heart and chronic kidney disease with heart failure and stage 1 through stage 4 chronic kidney disease, or unspecified chronic kidney disease: Secondary | ICD-10-CM | POA: Diagnosis not present

## 2022-08-22 DIAGNOSIS — I5022 Chronic systolic (congestive) heart failure: Secondary | ICD-10-CM

## 2022-08-22 DIAGNOSIS — F410 Panic disorder [episodic paroxysmal anxiety] without agoraphobia: Secondary | ICD-10-CM | POA: Diagnosis not present

## 2022-08-22 DIAGNOSIS — R0602 Shortness of breath: Secondary | ICD-10-CM | POA: Diagnosis not present

## 2022-08-22 DIAGNOSIS — Z95810 Presence of automatic (implantable) cardiac defibrillator: Secondary | ICD-10-CM | POA: Diagnosis not present

## 2022-08-22 DIAGNOSIS — E1122 Type 2 diabetes mellitus with diabetic chronic kidney disease: Secondary | ICD-10-CM | POA: Insufficient documentation

## 2022-08-22 DIAGNOSIS — K219 Gastro-esophageal reflux disease without esophagitis: Secondary | ICD-10-CM | POA: Insufficient documentation

## 2022-08-22 DIAGNOSIS — E861 Hypovolemia: Secondary | ICD-10-CM | POA: Diagnosis not present

## 2022-08-22 DIAGNOSIS — N1832 Chronic kidney disease, stage 3b: Secondary | ICD-10-CM

## 2022-08-22 DIAGNOSIS — Z794 Long term (current) use of insulin: Secondary | ICD-10-CM | POA: Insufficient documentation

## 2022-08-22 DIAGNOSIS — Z7982 Long term (current) use of aspirin: Secondary | ICD-10-CM | POA: Diagnosis not present

## 2022-08-22 DIAGNOSIS — Z87442 Personal history of urinary calculi: Secondary | ICD-10-CM | POA: Diagnosis not present

## 2022-08-22 LAB — ECHOCARDIOGRAM COMPLETE
AR max vel: 2.38 cm2
AV Area VTI: 2.65 cm2
AV Area mean vel: 2.46 cm2
AV Mean grad: 2.5 mmHg
AV Peak grad: 5.1 mmHg
Ao pk vel: 1.13 m/s
Area-P 1/2: 3.42 cm2
Calc EF: 38.1 %
S' Lateral: 4.2 cm
Single Plane A2C EF: 38 %
Single Plane A4C EF: 37.2 %

## 2022-08-22 MED ORDER — METOPROLOL SUCCINATE ER 50 MG PO TB24: 50.0000 mg | ORAL_TABLET | Freq: Every day | ORAL | 3 refills | Status: DC

## 2022-08-22 NOTE — Progress Notes (Signed)
ADVANCED HEART FAILURE CLINIC NOTE  Referring Physician: [name], MD  Primary Care: [name], MD Primary Cardiologist: [name] EP: Dr. Quentin Ore  HPI: [name] is a 57 y.o. male with HFrEF (dx 2015) w/ LHC demonstrating normal coronary anatomy s/p primary prevention ICD, CKDIIIB & T2DM. He has been followed outpatient by [name]. In 2017 he had a CPX with w/ moderate functional impairment, peak VO2 of 15, VE/VCO2 of 35. Outpatient he has been on a stable regimen of Entresto/coreg/farxiga. From a functional standpoint, [name] reports running 2-3 miles daily until he was diagnosed with 'long covid' 7 months ago. Since that time he has seen a sharp decline in exercise capacity. In November 2023, he was admitted for VT believed to be secondary to significant electrolyte derrangements from and underlying right gluteal abscess now s/p IV antibiotics. Throughout his admission he had recurrent device shocks for VT. He underwent RHC/LHC that demonstrated normal coronary anatomy with RHC showing normal cardiac output and low filling pressures. He subsequently underwent placement of right atrial lead with improvement in ectopy after atrial pacing. TTE during admission with LVEF of 30-35%.   Interval history:  [name] is is continuing to make slow progress.  His anxiety has improved.  Unfortunately he continues to have dyspnea.  These episodes are unrelated to exertion or rest and very short acting.  He states that he can be sitting there when he suddenly has to take several deep breaths.  Otherwise no complaints today.  Activity level/exercise tolerance: NYHA IIb-III Orthopnea:  Sleeps on 3 Paroxysmal noctural dyspnea:  Yes Chest pain/pressure:  No Orthostatic lightheadedness:  No Palpitations:  No Lower extremity edema:  No Presyncope/syncope:  No Cough:  No  Past Medical History:  Diagnosis Date   AICD (automatic cardioverter/defibrillator) present     Chronic bronchitis ([place])    "get it most q yr" (12/23/2013)   Chronic systolic (congestive) heart failure ([place])    Fracture of left humerus    a. 07/2013.   GERD (gastroesophageal reflux disease)    not at present time   Heart murmur    "born w/it"    History of renal calculi    Hypertension    Kidney stones    NICM (nonischemic cardiomyopathy) ([place])    a. 07/2013 Echo: EF 20-25%, diff HK, Gr2 DD, mild MR, mod dil LA/RA. 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     amiodarone (PACERONE) 200 MG tablet Take 1 tablet (200 mg total) by mouth daily. 90 tablet 3   aspirin 81 MG tablet Take 81 mg by mouth daily.     atorvastatin (LIPITOR) 10 MG tablet Take 1 tablet (10 mg total) by mouth daily. 90 tablet 1   Blood Glucose Monitoring Suppl (ACCU-CHEK GUIDE) w/Device KIT UAD 1 kit 0   cetirizine (ZYRTEC) 10 MG tablet Take 1 tablet (10 mg total) by mouth daily. 90 tablet 1   cholecalciferol (VITAMIN D3) 25 MCG (1000 UNIT) tablet Take 1,000 Units by mouth daily.  clobetasol cream (TEMOVATE) [id number] % Apply 1 Application topically as needed.     clonazePAM (KLONOPIN) 0.5 MG tablet Take 1 tablet (0.5 mg total) by mouth 2 (two) times daily as needed for anxiety. 60 tablet 0   Continuous Blood Gluc Sensor (FREESTYLE LIBRE 2 SENSOR) MISC Use to check blood sugar TID. Change sensor once every 14 days. E11.65 2 each 11   dapagliflozin propanediol (FARXIGA) 10 MG TABS tablet Take 1 tablet (10 mg total) by mouth daily before breakfast. 90 tablet 3   EPINEPHrine 0.3 mg/0.3 mL IJ SOAJ injection  Inject 0.3 mg into the muscle as needed. 1 each 1   famotidine (PEPCID) 20 MG tablet Take 1 tablet by mouth 1-2 times a day as needed. 90 tablet 1   fluticasone (FLONASE) 50 MCG/ACT nasal spray Use 1-2 sprays in each nostril once a day as needed for nasal congestion. 16 g 5   furosemide (LASIX) 80 MG tablet Take 1 tablet (80 mg total) by mouth daily as needed for edema or fluid. 180 tablet 1   gabapentin (NEURONTIN) 300 MG capsule TAKE 1 CAPSULE BY MOUTH EVERY DAY AT BEDTIME 90 capsule 1   glucose blood (ACCU-CHEK GUIDE) test strip Use as instructed 100 each 12   glucose blood (FREESTYLE LITE) test strip Use as instructed.  Please make sure this is compatible with [facility] 2 reader. 100 each 12   insulin aspart (NOVOLOG FLEXPEN) 100 UNIT/ML FlexPen Inject 11 Units into the skin 2 (two) times daily before a meal. 15 mL 2   insulin glargine (LANTUS SOLOSTAR) 100 UNIT/ML Solostar Pen Inject 32 Units into the skin daily. 15 mL 1   Insulin Pen Needle (PEN NEEDLES) 31G X 8 MM MISC UAD 100 each 6   Lancet Devices (ACCU-CHEK SOFTCLIX) lancets 1 each by Other route 3 (three) times daily. 1 each 0   methocarbamol (ROBAXIN) 500 MG tablet TAKE 1 TABLET BY MOUTH ONCE DAILY AS NEEDED FOR MUSCLE SPASM 30 tablet 1   sacubitril-valsartan (ENTRESTO) 24-26 MG Take 2 tablets by mouth 2 (two) times daily.     sildenafil (VIAGRA) 100 MG tablet Take 1-2 tabs PO 1/2-1 hr prior to intercourse. 20 tablet 4   spironolactone (ALDACTONE) 25 MG tablet Take 0.5 tablets (12.5 mg total) by mouth daily. 30 tablet 5   tamsulosin (FLOMAX) 0.4 MG CAPS capsule Take 2 capsules (0.8 mg total) by mouth daily. 180 capsule 1   triamcinolone cream (KENALOG) 0.1 % Apply sparingly to red itchy areas twice daily as needed. Avoid face, neck, armpits or groin area. Do not use more than 3 weeks in a row. 45 g 5   albuterol (VENTOLIN HFA) 108 (90 Base) MCG/ACT inhaler Inhale into the lungs every 6 (six) hours as needed for wheezing or shortness of  breath. (Patient not taking: Reported on 08/22/2022)     ipratropium-albuterol (DUONEB) 0.5-2.5 (3) MG/3ML SOLN Take 3 mLs by nebulization every 6 (six) hours as needed. (Patient not taking: Reported on 08/22/2022) 360 mL 2   metoprolol succinate (TOPROL-XL) 50 MG 24 hr tablet Take 1 tablet (50 mg total) by mouth daily. 90 tablet 3   No current facility-administered medications for this encounter.    Allergies  Allergen Reactions   Bee Venom Anaphylaxis   Trulicity [Dulaglutide] Diarrhea    Extra dose of Trulicity caused cardiac arrest. Patient experience onset of diarrhea with using this medication - cleared after med stopped.      Social History   Socioeconomic History  Marital status: Single    Spouse name: Not on file   Number of children: Not on file   Years of education: Not on file   Highest education level: Not on file  Occupational History   Occupation: Disabled  Tobacco Use   Smoking status: Former    Packs/day: 1.00    Years: 28.00    Additional pack years: 0.00    Total pack years: 28.00    Types: Cigarettes    Quit date: 06/03/2014    Years since quitting: 8.2   Smokeless tobacco: Never  Vaping Use   Vaping Use: Never used  Substance and Sexual Activity   Alcohol use: Yes   Drug use: Yes    Types: Marijuana    Comment: occasionally   Sexual activity: Yes  Other Topics Concern   Not on file  Social History Narrative   Pt lives with his brother    Social Determinants of Health   Financial Resource Strain: High Risk (05/08/2022)   Overall Financial Resource Strain (CARDIA)    Difficulty of Paying Living Expenses: Very hard  Food Insecurity: No Food Insecurity (05/14/2022)   Hunger Vital Sign    Worried About Running Out of Food in the Last Year: Never true    [place] in the Last Year: Never true  Transportation Needs: No Transportation Needs (05/06/2022)   PRAPARE - Hydrologist (Medical): No    Lack of  Transportation (Non-Medical): No  Physical Activity: Not on file  Stress: Not on file  Social Connections: Moderately Integrated (03/12/2021)   Social Connection and Isolation Panel [NHANES]    Frequency of Communication with Friends and Family: More than three times a week    Frequency of Social Gatherings with Friends and Family: More than three times a week    Attends Religious Services: More than 4 times per year    Active Member of [employer] or Organizations: Yes    Attends Archivist Meetings: Never    Marital Status: Never married  Intimate Partner Violence: Not At Risk (05/06/2022)   Humiliation, Afraid, Rape, and Kick questionnaire    Fear of Current or Ex-Partner: No    Emotionally Abused: No    Physically Abused: No    Sexually Abused: No      Family History  Problem Relation Age of Onset   Diabetes Father    Arthritis Father    Hypertension Father    Diabetes Mother    Arthritis Mother    Hypertension Mother    Hypertension Brother     PHYSICAL EXAM: Vitals:   08/22/22 1152  BP: 110/70  Pulse: 64  SpO2: 97%   GENERAL: Well nourished, well developed, and in no apparent distress at rest.  HEENT: Negative for arcus senilis or xanthelasma. There is no scleral icterus.  The mucous membranes are pink and moist.   NECK: Supple, No masses. Normal carotid upstrokes without bruits. No masses or thyromegaly.    CHEST: There are no chest wall deformities. There is no chest wall tenderness. Respirations are unlabored.  Lungs-CTA bilaterally CARDIAC:  JVP: 7 cm          Normal rate with regular rhythm. No murmurs, rubs or gallops.  Pulses are 2+ and symmetrical in upper and lower extremities.  No edema.  ABDOMEN: Soft, non-tender, non-distended. There are no masses or hepatomegaly. There are normal bowel sounds.  EXTREMITIES: Warm and well perfused with no cyanosis, clubbing.  LYMPHATIC: No axillary or supraclavicular lymphadenopathy.  NEUROLOGIC: Patient is  oriented x3 with no focal or lateralizing neurologic deficits.  PSYCH: Patients affect is appropriate, there is no evidence of anxiety or depression.  SKIN: Warm and dry; no lesions or wounds.    DATA REVIEW  ECG:NSR  ECHO: 08/22/2022: LVEF 35%, normal RV function 05/06/2022: LVEF 30-35%, normal RV function.  02/16/19: LVEF 45-50%, normal RV function.   CATH: 05/12/22: HEMODYNAMICS: RA:                  1 mmHg (mean) RV:                  22/1-3 mmHg PA:                  23/6 mmHg (12 mean) PCWP:            4 mmHg (mean)                                      Estimated Fick CO/CI   7 L/min, 2.9 L/min/m2                                                 TPG                 8  mmHg                                              PVR                 ~1 Wood Units  PAPi                >5       IMPRESSION: Low pre and post capillary filling pressures.  Normal cardiac output/cardiac index.  Normal PVR & PA mean No obstructive CAD, left dominant w/ large Lcx supplying multiple large marginals.    ASSESSMENT & PLAN:  Heart failure with reduced EF, Stage C, NYHA IIB-III Etiology of [id number] as demonstrated by coronary angiography; no family history. If he continues to have recurrent VT will consider evaluation for sarcoidosis. Device interrogation w/o arrhythmias NYHA class / AHA Stage:IIB-III, I do not believe his shortness of breath is secondary to cardiac etiology.  His IVC is less than 1 cm and he is euvolemic on exam today. Volume status & Diuretics: Lasix prn only; euvolemic on exam IVC less than 1 cm. Vasodilators:Entresto 24/26mg  BID Beta-Blocker: Continue Toprol to 50mg  MRA: Continue spironolactone 12.5 mg daily.  No other medication changes. Cardiometabolic:Increase farxiga to 10mg  daily Devices therapies & Valvulopathies:dcICD Advanced therapies: CPX in 2017 peak VO2 of 15 AND ve/vco2 of 35 but with submaximal effort; consistent with moderate HF limitations. Advanced  therapies at this time limited by CKD and social barriers. Start cardiac rehab  2. VT -No further episodes on device interrogation.  3. T2DM - Farxiga, insulin  - Followed by his PCP  4. CKD3B - Continue farxiga and Entresto - repeat labs pending  5. Panic attacks -Will follow-up with psychiatry.  6. Gluteal cellulitis  -Following with general surgery  7.  Shortness of breath  I do not believe these episodes of shortness of breath are cardiac in nature.  The frequency, duration do not fit a cardiac etiology and these episodes do not appear to be worse with exertion or with rest.  PFTs remain pending.  I advised him to continue using his inhalers which she has not been using.  In addition he is euvolemic to hypovolemic on exam.  IVC less than 1 cm and collapsible.  Tai Syfert Advanced Heart Failure Mechanical Circulatory Support

## 2022-08-22 NOTE — Patient Instructions (Signed)
No changes to your medications.  Your physician recommends that you schedule a follow-up appointment in: 6 months (September) ** please call the office in July to arrange your follow up appointment. **  If you have any questions or concerns before your next appointment please send [place] a message through [place] or call our office at [phone].    TO LEAVE A MESSAGE FOR THE NURSE SELECT OPTION 2, PLEASE LEAVE A MESSAGE INCLUDING: YOUR NAME DATE OF BIRTH CALL BACK NUMBER REASON FOR CALL**this is important as we prioritize the call backs  YOU WILL RECEIVE A CALL BACK THE SAME DAY AS LONG AS YOU CALL BEFORE 4:00 PM  At the [facility], you and your health needs are our priority. As part of our continuing mission to provide you with exceptional heart care, we have created designated Provider Care Teams. These Care Teams include your primary Cardiologist (physician) and Advanced Practice Providers (APPs- Physician Assistants and Nurse Practitioners) who all work together to provide you with the care you need, when you need it.   You may see any of the following providers on your designated Care Team at your next follow up: Dr Glori Bickers Dr Loralie Champagne Dr. Roxana Hires, NP Lyda Jester, [place] [facility] [place], [place] Forestine Na, NP Audry Riles, PharmD   Please be sure to bring in all your medications bottles to every appointment.    Thank you for choosing [facility]

## 2022-08-25 ENCOUNTER — Other Ambulatory Visit: Payer: Self-pay

## 2022-08-25 ENCOUNTER — Telehealth: Payer: Self-pay

## 2022-08-25 NOTE — Telephone Encounter (Signed)
A PRIOR AUTHORIZATION REQUST FOR FREESTYLE LIBRE 2 HAS BEEN APPROVED UNTIL 02/25/2023

## 2022-08-26 ENCOUNTER — Other Ambulatory Visit: Payer: Self-pay

## 2022-08-27 ENCOUNTER — Ambulatory Visit (HOSPITAL_COMMUNITY): Payer: Medicaid Other | Admitting: Psychiatry

## 2022-08-28 ENCOUNTER — Encounter (HOSPITAL_COMMUNITY): Payer: Self-pay | Admitting: Psychiatry

## 2022-08-28 ENCOUNTER — Telehealth (HOSPITAL_BASED_OUTPATIENT_CLINIC_OR_DEPARTMENT_OTHER): Payer: Medicaid Other | Admitting: Psychiatry

## 2022-08-28 DIAGNOSIS — F410 Panic disorder [episodic paroxysmal anxiety] without agoraphobia: Secondary | ICD-10-CM

## 2022-08-28 DIAGNOSIS — F4310 Post-traumatic stress disorder, unspecified: Secondary | ICD-10-CM

## 2022-08-28 DIAGNOSIS — F411 Generalized anxiety disorder: Secondary | ICD-10-CM

## 2022-08-28 MED ORDER — CLONAZEPAM 0.5 MG PO TABS: 0.5000 mg | ORAL_TABLET | Freq: Two times a day (BID) | ORAL | 0 refills | Status: DC | PRN
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
## 2022-08-29 ENCOUNTER — Other Ambulatory Visit (HOSPITAL_COMMUNITY): Payer: Medicaid Other

## 2022-08-29 ENCOUNTER — Encounter (HOSPITAL_COMMUNITY): Payer: Medicaid Other | Admitting: Cardiology

## 2022-08-29 ENCOUNTER — Telehealth: Payer: Self-pay | Admitting: Pulmonary Disease

## 2022-08-29 NOTE — Telephone Encounter (Signed)
PT says last Antibx we called in is still not working. Dr. told him to call us if he is still not better for a second round. Pls call PT to advise.  [phone]  Pharm: [facility] on [address].

## 2022-08-29 NOTE — Telephone Encounter (Signed)
Left message for patient to call back  

## 2022-08-30 ENCOUNTER — Encounter (HOSPITAL_BASED_OUTPATIENT_CLINIC_OR_DEPARTMENT_OTHER): Payer: Medicaid Other | Admitting: Internal Medicine

## 2022-08-30 ENCOUNTER — Encounter: Payer: Self-pay | Admitting: Internal Medicine

## 2022-08-30 ENCOUNTER — Telehealth: Payer: Self-pay | Admitting: Internal Medicine

## 2022-08-30 ENCOUNTER — Other Ambulatory Visit: Payer: Self-pay | Admitting: Allergy & Immunology

## 2022-08-30 DIAGNOSIS — J40 Bronchitis, not specified as acute or chronic: Secondary | ICD-10-CM | POA: Diagnosis not present

## 2022-08-30 NOTE — Telephone Encounter (Signed)
Cough for one day. Coughing up yellowish brown phlegm. No blood. No worsening shortness of breath which is chronic due to CHF. No fevers, chills, body aches. Appetite is ok.   Hard to say if these symptoms are related to heart failure or bronchitis. Has not had PFTS. Has significant cardiac history. Could be in decompensated state. Needs to be evaluated in the office as I'm not sure an antibiotic over the phone would help anything right now.

## 2022-09-01 ENCOUNTER — Other Ambulatory Visit: Payer: Self-pay | Admitting: Internal Medicine

## 2022-09-01 MED ORDER — BENZONATATE 100 MG PO CAPS: 100.0000 mg | ORAL_CAPSULE | Freq: Two times a day (BID) | ORAL | 0 refills | Status: DC | PRN

## 2022-09-01 MED ORDER — DOXYCYCLINE HYCLATE 100 MG PO TABS: 100.0000 mg | ORAL_TABLET | Freq: Two times a day (BID) | ORAL | 0 refills | Status: DC

## 2022-09-01 NOTE — Telephone Encounter (Signed)
Spoke with patient. He advises he is not feeling any better today. Still has a productive cough with phlegm x 3 days now and sob. I offered patient a appointment which he declined. Patient is requesting a antibiotic.   Dr. Valeta Harms can you please advise   [facility] on [address]

## 2022-09-01 NOTE — Telephone Encounter (Signed)
Spoke with pt and scheduled for an acute My Chart apt on 09/04/22  with Dr. Shearon Stalls.  Pt advise if symptoms get worse to seek medical evaluation at [facility] or ED. Nothing further needed at this time.   Routing to Dr. Shearon Stalls as Juluis Rainier

## 2022-09-01 NOTE — Telephone Encounter (Signed)
Please see the MyChart message reply(ies) for my assessment and plan.    This patient gave consent for this Medical Advice Message and is aware that it may result in a bill to their insurance company, as well as the possibility of receiving a bill for a co-payment or deductible. They are an established patient, but are not seeking medical advice exclusively about a problem treated during an in person or video visit in the last seven days. I did not recommend an in person or video visit within seven days of my reply.    I spent a total of 5 minutes cumulative time within 7 days through MyChart messaging.  Kiley Solimine, MD   

## 2022-09-04 ENCOUNTER — Encounter: Payer: Self-pay | Admitting: Internal Medicine

## 2022-09-04 ENCOUNTER — Telehealth (INDEPENDENT_AMBULATORY_CARE_PROVIDER_SITE_OTHER): Payer: Medicaid Other | Admitting: Internal Medicine

## 2022-09-04 ENCOUNTER — Encounter: Payer: Self-pay | Admitting: Allergy & Immunology

## 2022-09-04 ENCOUNTER — Other Ambulatory Visit: Payer: Self-pay | Admitting: Allergy & Immunology

## 2022-09-04 DIAGNOSIS — J441 Chronic obstructive pulmonary disease with (acute) exacerbation: Secondary | ICD-10-CM | POA: Diagnosis not present

## 2022-09-04 MED ORDER — SALINE SPRAY 0.65 % NA SOLN: 1.0000 | NASAL | 0 refills | Status: DC | PRN

## 2022-09-04 NOTE — Progress Notes (Signed)
I connected with [name] on 09/04/2022 by video enabled telemedicine application and verified that I am speaking with the correct person using two identifiers. Patient is at home, Physician is in office.    I discussed the limitations of evaluation and management by telemedicine. The patient expressed understanding and agreed to proceed.            [name]    [id number]    1965/06/19  Primary Care Physician:Johnson, Dalbert Batman, MD Date of Appointment: 09/04/2022 Established Patient Visit  Chief complaint:   Chief Complaint  Patient presents with   Acute Visit    Head and chest congestion, chest tightness x 5 days.  Cough with Yellow mucus.  MyChart visit with PCP on 09/01/2022.  Rx Doxycycline. Currently taking but states not helping sx.     HPI: [name] is a 57 y.o. man with history of NICM, HFrEF EF 20%, h/o VT s/p ICD placement, and chronic bronchitis with former tobacco use disorder.   Interval Updates: Here for a sick visit. Symptoms started 6 days ago with cough, congestion. No fevers, chills, no worsening shortness of breath which he reports as chronic from CHF.  He isn't taking any breathing treatments because he doesn't know how to use the machine. He is having wheezing and chest tightness.   He was prescribed abx by his PCP Dr. Wynetta Emery, doxycycline three days ago. Prior to this has had multiple course of abx from our office as well.   For copd he is prescribed albuterol prn, duoneb prn. These have expired. He had no airflow limitation on PFTs last obtained in 2017 - repeat PFTs have been ordered a few times but not completed.    I have reviewed the patient's family social and past medical history and updated as appropriate.   Past Medical History:  Diagnosis Date   AICD (automatic cardioverter/defibrillator) present    Chronic bronchitis ([place])    "get it most q yr" (12/23/2013)   Chronic systolic (congestive) heart failure ([place])    Fracture of  left humerus    a. 07/2013.   GERD (gastroesophageal reflux disease)    not at present time   Heart murmur    "born w/it"    History of renal calculi    Hypertension    Kidney stones    NICM (nonischemic cardiomyopathy) ([place])    a. 07/2013 Echo: EF 20-25%, diff HK, Gr2 DD, mild MR, mod dil LA/RA. 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    Shockable heart rhythm detected by automated external defibrillator    Syncope    Type II diabetes mellitus ([place]) 2006    Past Surgical History:  Procedure Laterality Date   CARDIAC CATHETERIZATION  08/10/13   CHOLECYSTECTOMY  ~ 2010   IMPLANTABLE CARDIOVERTER DEFIBRILLATOR IMPLANT  12/23/2013   STJ Fortify ICD implanted by [name] for cardiomyopathy and syncope   IMPLANTABLE CARDIOVERTER DEFIBRILLATOR IMPLANT N/A 12/23/2013   Procedure: IMPLANTABLE CARDIOVERTER DEFIBRILLATOR IMPLANT;  Surgeon: Coralyn Mark, MD;  Location[facility] CATH LAB;  Service: Cardiovascular;  Laterality: N/A;   INGUINAL HERNIA REPAIR Left 03/01/2018   Procedure: OPEN REPAIR OF LEFT INGUINAL HERNIA WITH MESH;  Surgeon: Greer Pickerel, MD;  Location: [facility] ORS;  Service: General;  Laterality: Left;   LEAD REVISION/REPAIR N/A 05/15/2022   Procedure: LEAD REVISION/REPAIR;  Surgeon: Vickie Epley, MD;  Location: [facility] CV LAB;  Service: Cardiovascular;  Laterality: N/A;   LEFT HEART CATHETERIZATION WITH CORONARY ANGIOGRAM N/A 08/10/2013   Procedure: LEFT HEART CATHETERIZATION WITH CORONARY ANGIOGRAM;  Surgeon: Jettie Booze, MD;  Location: [facility] CATH LAB;  Service: Cardiovascular;  Laterality: N/A;   ORIF HUMERUS FRACTURE Left 08/12/2013   Procedure: OPEN REDUCTION INTERNAL FIXATION (ORIF) HUMERAL SHAFT FRACTURE;  Surgeon: Newt Minion, MD;  Location: [place];  Service: Orthopedics;  Laterality: Left;  Open Reduction Internal Fixation Left Humerus   RIGHT/LEFT HEART CATH AND CORONARY ANGIOGRAPHY N/A 05/12/2022    Procedure: RIGHT/LEFT HEART CATH AND CORONARY ANGIOGRAPHY;  Surgeon: Hebert Soho, DO;  Location: [place] CV LAB;  Service: Cardiovascular;  Laterality: N/A;   URETEROSCOPY     "laser for kidney stones"    Family History  Problem Relation Age of Onset   Diabetes Father    Arthritis Father    Hypertension Father    Diabetes Mother    Arthritis Mother    Hypertension Mother    Hypertension Brother     Social History   Occupational History   Occupation: Disabled  Tobacco Use   Smoking status: Former    Packs/day: 1.00    Years: 28.00    Additional pack years: 0.00    Total pack years: 28.00    Types: Cigarettes    Quit date: 06/03/2014    Years since quitting: 8.2   Smokeless tobacco: Never  Vaping Use   Vaping Use: Never used  Substance and Sexual Activity   Alcohol use: Yes   Drug use: Yes    Types: Marijuana    Comment: occasionally   Sexual activity: Yes     Physical Exam: There were no vitals taken for this visit. - video visit  Gen:      No acute distress, non toxic, mild nasal congestion noted in voice Lungs:    No increased respiratory effort.able to complete full sentences, no audible wheezing   Data Reviewed: Imaging: I have personally reviewed the chest xray Feb 2024 shows cardiomegaly but clear lungs, no effusion or pna  PFTs:     Latest Ref Rng & Units 02/07/2016    1:37 PM  PFT Results  FVC-Pre L 3.31   FVC-Predicted Pre % 71   FVC-Post L 3.23   FVC-Predicted Post % 69   Pre FEV1/FVC % % 79   Post FEV1/FCV % % 73   FEV1-Pre L 2.60   FEV1-Predicted Pre % 70   FEV1-Post L 2.36   DLCO uncorrected ml/min/mmHg 19.77   DLCO UNC% % 54   DLVA Predicted % 110   TLC L 5.68   TLC % Predicted % 75   RV % Predicted % 102    I have personally reviewed the patient's PFTs and no airflow limitation on PFTS.   Labs:  Immunization status: Immunization History  Administered Date(s) Administered   PFIZER(Purple Top)SARS-COV-2  Vaccination 10/21/2019    External Records Personally Reviewed: cardiology, pulmonary, pcp  Assessment:  Chronic Bronchitis with acute exacerbation  Plan/Recommendations:  Needs to follow up in person with PFTs. This is already scheduled for May 2nd. Reviewed this with patient.  Recommend finishing course of doxycycline started by PCP.  Start taking duoneb treatments - we reviewed how to use the nebulizer machine. Take very 4 hours as needed for shortness of breath, chest tightness, wheezing.  Continue flonase.  Start taking nasal saline to help with nasal congestion.    Return to Care: Already scheduled in may.  Lenice Llamas, MD [facility] [place]

## 2022-09-04 NOTE — Patient Instructions (Signed)
Follow up as scheduled in May.   Finish antibiotics Dr. Wynetta Emery gave you.   Take the duoneb nebulizer treatments every 4 hours as needed for shortness of breath.  Continue flonase. Start taking nasal saline in between to help with symptoms of nasal congestion.

## 2022-09-05 NOTE — Progress Notes (Signed)
No ICM remote transmission received for 09/01/2022 and next ICM transmission scheduled for 09/22/2022.

## 2022-09-10 ENCOUNTER — Other Ambulatory Visit (HOSPITAL_COMMUNITY): Payer: Self-pay

## 2022-09-10 ENCOUNTER — Encounter (HOSPITAL_COMMUNITY): Payer: Self-pay

## 2022-09-10 MED ORDER — ENTRESTO 24-26 MG PO TABS: 1.0000 | ORAL_TABLET | Freq: Two times a day (BID) | ORAL | 6 refills | Status: DC

## 2022-09-10 MED ORDER — ENTRESTO 24-26 MG PO TABS: 2.0000 | ORAL_TABLET | Freq: Two times a day (BID) | ORAL | 6 refills | Status: DC

## 2022-09-10 NOTE — Telephone Encounter (Signed)
x

## 2022-09-15 ENCOUNTER — Other Ambulatory Visit: Payer: Self-pay | Admitting: Internal Medicine

## 2022-09-15 ENCOUNTER — Encounter: Payer: Self-pay | Admitting: Internal Medicine

## 2022-09-15 DIAGNOSIS — E1169 Type 2 diabetes mellitus with other specified complication: Secondary | ICD-10-CM

## 2022-09-18 ENCOUNTER — Encounter: Payer: Self-pay | Admitting: *Deleted

## 2022-09-18 ENCOUNTER — Ambulatory Visit (INDEPENDENT_AMBULATORY_CARE_PROVIDER_SITE_OTHER): Payer: Medicaid Other | Admitting: Internal Medicine

## 2022-09-18 ENCOUNTER — Encounter: Payer: Self-pay | Admitting: Internal Medicine

## 2022-09-18 VITALS — BP 122/80 | HR 62 | Ht 72.0 in | Wt 282.8 lb

## 2022-09-18 DIAGNOSIS — E1169 Type 2 diabetes mellitus with other specified complication: Secondary | ICD-10-CM | POA: Insufficient documentation

## 2022-09-18 DIAGNOSIS — E785 Hyperlipidemia, unspecified: Secondary | ICD-10-CM | POA: Diagnosis not present

## 2022-09-18 DIAGNOSIS — E1159 Type 2 diabetes mellitus with other circulatory complications: Secondary | ICD-10-CM

## 2022-09-18 DIAGNOSIS — E1165 Type 2 diabetes mellitus with hyperglycemia: Secondary | ICD-10-CM | POA: Diagnosis not present

## 2022-09-18 LAB — POCT GLYCOSYLATED HEMOGLOBIN (HGB A1C): Hemoglobin A1C: 7.9 % — AB (ref 4.0–5.6)

## 2022-09-18 NOTE — Progress Notes (Signed)
Patient ID: [name], male   DOB: 06-23-1965, 57 y.o.   MRN: [id number]  HPI: [name] is a 57 y.o.-year-old male, referred by his PCP, Dr. Laural Benes, for management of DM2, dx in 2010-15, insulin-dependent since 2023, uncontrolled, with complications (nonischemic cardiomyopathy, CHF with reduced ejection fraction, history of ventricular tachycardia, history of cardiac arrest, s/p ICD placement, stage IIIb CKD).  Since 04/2022, after his cardiac arrest, he has been SOB and has gained 30 lbs. he also has anxiety and PTSD.  Reviewed HbA1c: Lab Results  Component Value Date   HGBA1C 10.2 (H) 05/04/2022   HGBA1C 15.0 (A) 01/30/2022   HGBA1C 13.0 (H) 08/26/2021   HGBA1C 8.9 (A) 03/19/2021   HGBA1C 8.2 (H) 07/23/2020   HGBA1C 8.7 (A) 04/09/2020   HGBA1C 7.5 (A) 10/24/2019   HGBA1C 6.7 03/24/2019   HGBA1C 7.0 (H) 10/13/2018   HGBA1C 7.5 (A) 07/01/2018   Pt is on a regimen of: - Farxiga 10 mg before breakfast - Lantus 22 units at bedtime - Novolog 12 units 2x a day, before meals He reports diarrhea after increased Trulicity >> hypokalemia >> cardiac arrest - was in ICU for 16 days, had heart surgery >> now PTSD.  Pt checks his sugars >4x a day  - with the CGM receiver:  Lowest sugar was 69; he has hypoglycemia awareness at 70.  Highest sugar was 300 (forgot insulin).  Glucometer: Accu-Chek guide  Pt's meals are: - Breakfast: muffins, 3 eggs - Lunch: skips - Dinner: salad from [employer] - tuna, onions, olives, crackers + cookie - Snacks: 4 cookies in the middle of the night, cranberry juice Previously also drinking regular sodas and sweet tea, which she stopped.  - no CKD, last BUN/creatinine:  Lab Results  Component Value Date   BUN 22 (H) 08/05/2022   BUN 29 (H) 07/23/2022   CREATININE 2.14 (H) 08/05/2022   CREATININE 2.20 (H) 07/23/2022  On Farxiga 10 mg daily, Entresto.  -+ HL; last set of lipids: Lab Results  Component Value Date   CHOL 178 08/26/2021   HDL 42  08/26/2021   LDLCALC 81 08/26/2021   TRIG 336 (H) 08/26/2021   CHOLHDL 4.2 08/26/2021  On Lipitor 10 mg daily.  - last eye exam was in 2022. No DR reportedly. Sees Dr. Dione Booze tomorrow.  - no numbness and tingling in his feet.  Last foot exam was in 2021.  On Neurontin 300 mg at bedtime. She saw Dr. Eloy End in the past and would like to return to see her.  Pt has FH of DM in mother and father.  He also has a history of HTN, kidney stones, GERD, IBS, gout, chronic low back pain (spondylosis and spinal stenosis), PTSD/history of suicidal ideation.  He is on amiodarone, ASA 81.  ROS: Constitutional: + Weight gain, + fatigue, no subjective hyperthermia, no subjective hypothermia, no nocturia, + poor sleep Eyes: no blurry vision, no xerophthalmia ENT: no sore throat, no nodules palpated in neck, no dysphagia, no odynophagia, no hoarseness, no tinnitus, no hypoacusis Cardiovascular: no CP, + SOB, + palpitations, no leg swelling Respiratory: no cough, + SOB, no wheezing Gastrointestinal: no N, no V, no D, no C, no acid reflux Musculoskeletal: no muscle, no joint aches Skin: no rash, no hair loss Neurological: no tremors, no numbness or tingling/no dizziness/no HAs Psychiatric: + depression, + anxiety (PTSD) + Low libido, + difficulty with erections, + enlarged breasts  Past Medical History:  Diagnosis Date   AICD (automatic cardioverter/defibrillator) present  Chronic bronchitis ([facility])    "get it most q yr" (12/23/2013)   Chronic systolic (congestive) heart failure ([facility])    Fracture of left humerus    a. 07/2013.   GERD (gastroesophageal reflux disease)    not at present time   Heart murmur    "born w/it"    History of renal calculi    Hypertension    Kidney stones    NICM (nonischemic cardiomyopathy) ([facility])    a. 07/2013 Echo: EF 20-25%, diff HK, Gr2 DD, mild MR, mod dil LA/RA. 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    Shockable heart rhythm detected by automated external defibrillator    Syncope    Type II diabetes mellitus ([facility]) 2006   Past Surgical History:  Procedure Laterality Date   CARDIAC CATHETERIZATION  08/10/13   CHOLECYSTECTOMY  ~ 2010   IMPLANTABLE CARDIOVERTER DEFIBRILLATOR IMPLANT  12/23/2013   STJ Fortify ICD implanted by Dr Johney Frame for cardiomyopathy and syncope   IMPLANTABLE CARDIOVERTER DEFIBRILLATOR IMPLANT N/A 12/23/2013   Procedure: IMPLANTABLE CARDIOVERTER DEFIBRILLATOR IMPLANT;  Surgeon: Gardiner Rhyme, MD;  Location: [facility] CATH LAB;  Service: Cardiovascular;  Laterality: N/A;   INGUINAL HERNIA REPAIR Left 03/01/2018   Procedure: OPEN REPAIR OF LEFT INGUINAL HERNIA WITH MESH;  Surgeon: Gaynelle Adu, MD;  Location: [facility] OR[facility];  Service: General;  Laterality: Left;   LEAD REVISION/REPAIR N/A 05/15/2022   Procedure: LEAD REVISION/REPAIR;  Surgeon: Lanier Prude, MD;  Location: [facility] INVASIVE CV LAB;  Service: Cardiovascular;  Laterality: N/A;   LEFT HEART CATHETERIZATION WITH CORONARY ANGIOGRAM N/A 08/10/2013   Procedure: LEFT HEART CATHETERIZATION WITH CORONARY ANGIOGRAM;  Surgeon: Corky Crafts, MD;  Location: [facility] CATH LAB;  Service: Cardiovascular;  Laterality: N/A;   ORIF HUMERUS FRACTURE Left 08/12/2013   Procedure: OPEN REDUCTION INTERNAL FIXATION (ORIF) HUMERAL SHAFT FRACTURE;  Surgeon: Nadara Mustard, MD;  Location: [facility] OR;  Service: Orthopedics;  Laterality: Left;  Open Reduction Internal Fixation Left Humerus   RIGHT/LEFT HEART CATH AND CORONARY ANGIOGRAPHY N/A 05/12/2022   Procedure: RIGHT/LEFT HEART CATH AND CORONARY ANGIOGRAPHY;  Surgeon: Dorthula Nettles, DO;  Location: [facility] INVASIVE CV LAB;  Service: Cardiovascular;  Laterality: N/A;   URETEROSCOPY     "laser for kidney stones"   Social History   Socioeconomic History   Marital status: Single    Spouse name: Not on file   Number of children: Not on file   Years of education: Not on file    Highest education level: Not on file  Occupational History   Occupation: Disabled  Tobacco Use   Smoking status: Former    Packs/day: 1.00    Years: 28.00    Additional pack years: 0.00    Total pack years: 28.00    Types: Cigarettes    Quit date: 06/03/2014    Years since quitting: 8.2   Smokeless tobacco: Never  Vaping Use   Vaping Use: Never used  Substance and Sexual Activity   Alcohol use: Yes   Drug use: Yes    Types: Marijuana    Comment: occasionally   Sexual activity: Yes  Other Topics Concern   Not on file  Social History Narrative   Pt lives with his brother    Social Determinants of Health   Financial Resource Strain: High Risk (05/08/2022)   Overall Financial Resource Strain (CARDIA)    Difficulty of Paying Living Expenses: Very hard  Food Insecurity: No Food Insecurity (05/14/2022)   Hunger Vital Sign    Worried About Running Out of Food in the Last Year: Never true    Ran Out of Food in the Last Year: Never true  Transportation Needs: No Transportation Needs (05/06/2022)   PRAPARE - Administrator, Civil Service (Medical): No    Lack of Transportation (Non-Medical): No  Physical Activity: Not on file  Stress: Not on file  Social Connections: Moderately Integrated (03/12/2021)   Social Connection and Isolation Panel [NHANES]    Frequency of Communication with Friends and Family: More than three times a week    Frequency of Social Gatherings with Friends and Family: More than three times a week    Attends Religious Services: More than 4 times per year    Active Member of [employer] or Organizations: Yes    Attends Banker Meetings: Never    Marital Status: Never married  Intimate Partner Violence: Not At Risk (05/06/2022)   Humiliation, Afraid, Rape, and Kick questionnaire    Fear of Current or Ex-Partner: No    Emotionally Abused: No    Physically Abused: No    Sexually Abused: No   Current Outpatient Medications on File Prior to  Visit  Medication Sig Dispense Refill   Accu-Chek Softclix Lancets lancets Use as instructed 100 each 12   acetaminophen (TYLENOL) 500 MG tablet Take 1,000 mg by mouth 2 (two) times daily.     albuterol (VENTOLIN HFA) 108 (90 Base) MCG/ACT inhaler Inhale into the lungs every 6 (six) hours as needed for wheezing or shortness of breath.     amiodarone (PACERONE) 200 MG tablet Take 1 tablet (200 mg total) by mouth daily. 90 tablet 3   aspirin 81 MG tablet Take 81 mg by mouth daily.     atorvastatin (LIPITOR) 10 MG tablet Take 1 tablet (10 mg total) by mouth daily. 90 tablet 1   benzonatate (TESSALON) 100 MG capsule Take 1 capsule (100 mg total) by mouth 2 (two) times daily as needed for cough. 20 capsule 0   Blood Glucose Monitoring Suppl (ACCU-CHEK GUIDE) w/Device KIT UAD 1 kit 0   cetirizine (EQ ALLERGY RELIEF, CETIRIZINE,) 10 MG tablet Take 1 tablet by mouth once daily 90 tablet 1   cholecalciferol (VITAMIN D3) 25 MCG (1000 UNIT) tablet Take 1,000 Units by mouth daily.     clobetasol cream (TEMOVATE) 0.05 % Apply 1 Application topically as needed.     clonazePAM (KLONOPIN) 0.5 MG tablet Take 1 tablet (0.5 mg total) by mouth 2 (two) times daily as needed for anxiety. 60 tablet 0   Continuous Blood Gluc Sensor (FREESTYLE LIBRE 2 SENSOR) MISC Use to check blood sugar TID. Change sensor once every 14 days. E11.65 2 each 11   dapagliflozin propanediol (FARXIGA) 10 MG TABS tablet Take 1 tablet (10 mg total) by mouth daily before breakfast. 90 tablet 3   doxycycline (VIBRA-TABS) 100 MG tablet Take 1 tablet (100 mg total) by mouth 2 (two) times daily. 14 tablet 0   EPINEPHrine 0.3 mg/0.3 mL IJ SOAJ injection Inject 0.3 mg into the muscle as needed. 1 each 1   famotidine (PEPCID) 20 MG tablet Take 1 tablet by mouth 1-2 times a day as needed. 90 tablet 1   fluticasone (FLONASE) 50 MCG/ACT nasal spray Use 1-2 sprays in each nostril once a day as needed for nasal congestion. 16 g 5   furosemide (LASIX) 80 MG  tablet Take 1 tablet (  80 mg total) by mouth daily as needed for edema or fluid. 180 tablet 1   gabapentin (NEURONTIN) 300 MG capsule TAKE 1 CAPSULE BY MOUTH EVERY DAY AT BEDTIME 90 capsule 1   glucose blood (ACCU-CHEK GUIDE) test strip Use as instructed 100 each 12   glucose blood (FREESTYLE LITE) test strip Use as instructed.  Please make sure this is compatible with [facility] 2 reader. 100 each 12   insulin aspart (NOVOLOG FLEXPEN) 100 UNIT/ML FlexPen Inject 11 Units into the skin 2 (two) times daily before a meal. 15 mL 2   insulin glargine (LANTUS SOLOSTAR) 100 UNIT/ML Solostar Pen Inject 32 Units into the skin daily. 15 mL 1   Insulin Pen Needle (PEN NEEDLES) 31G X 8 MM MISC UAD 100 each 6   ipratropium-albuterol (DUONEB) 0.5-2.5 (3) MG/3ML SOLN Take 3 mLs by nebulization every 6 (six) hours as needed. 360 mL 2   Lancet Devices (ACCU-CHEK SOFTCLIX) lancets 1 each by Other route 3 (three) times daily. 1 each 0   methocarbamol (ROBAXIN) 500 MG tablet TAKE 1 TABLET BY MOUTH ONCE DAILY AS NEEDED FOR MUSCLE SPASM 30 tablet 1   metoprolol succinate (TOPROL-XL) 50 MG 24 hr tablet Take 1 tablet (50 mg total) by mouth daily. 90 tablet 3   sacubitril-valsartan (ENTRESTO) 24-26 MG Take 2 tablets by mouth 2 (two) times daily. Disregard previous refill requests. This is correct sig - 2 tablets twice daily 60 tablet 6   sildenafil (VIAGRA) 100 MG tablet Take 1-2 tabs PO 1/2-1 hr prior to intercourse. 20 tablet 4   sodium chloride (OCEAN) 0.65 % SOLN nasal spray Place 1 spray into both nostrils as needed for congestion. 104 mL 0   spironolactone (ALDACTONE) 25 MG tablet Take 0.5 tablets (12.5 mg total) by mouth daily. 30 tablet 5   tamsulosin (FLOMAX) 0.4 MG CAPS capsule Take 2 capsules (0.8 mg total) by mouth daily. 180 capsule 1   triamcinolone cream (KENALOG) 0.1 % Apply sparingly to red itchy areas twice daily as needed. Avoid face, neck, armpits or groin area. Do not use more than 3 weeks in a row. 45 g 5    No current facility-administered medications on file prior to visit.   Allergies  Allergen Reactions   Bee Venom Anaphylaxis   Trulicity [Dulaglutide] Diarrhea    Extra dose of Trulicity caused cardiac arrest. Patient experience onset of diarrhea with using this medication - cleared after med stopped.   Family History  Problem Relation Age of Onset   Diabetes Father    Arthritis Father    Hypertension Father    Diabetes Mother    Arthritis Mother    Hypertension Mother    Hypertension Brother    PE: BP 122/80 (BP Location: Right Arm, Patient Position: Sitting, Cuff Size: Normal)   Pulse 62   Ht 6' (1.829 m)   Wt 282 lb 12.8 oz (128.3 kg)   SpO2 94%   BMI 38.35 kg/m  Wt Readings from Last 3 Encounters:  09/18/22 282 lb 12.8 oz (128.3 kg)  08/22/22 279 lb (126.6 kg)  08/19/22 275 lb (124.7 kg)   Constitutional: overweight, in NAD Eyes:  EOMI, no exophthalmos ENT: no neck masses, no cervical lymphadenopathy Cardiovascular: RRR, No MRG Respiratory: CTA B Musculoskeletal: no deformities Skin:no rashes Neurological: no tremor with outstretched hands  ASSESSMENT: 1. DM2, insulin-dependent, uncontrolled, with long-term complications: - NICM - CHF with reduced ejection fraction-20 to 25% 08/2022 - history of VT + cardiac arrest 04/2022 (after  developing diarrhea from increasing Trulicity dose), s/p ICD placement - stage IIIb CKD  PLAN:  1. Patient with long-standing, uncontrolled diabetes, on oral antidiabetic regimen, with improving control after addition of insulin in 2023.HbA1c is lower today, however, at 7.9%. CGM interpretation: -At today's visit, we reviewed his CGM downloads: It appears that 67% of values are in target range (goal >70%), while 33% are higher than 180 (goal <25%), and 0% are lower than 70 (goal <4%).  The calculated average blood sugar is 171.  The projected HbA1c for the next 3 months (GMI) is 7.4%. -Reviewing the CGM trends, sugars appear to be  fluctuating in the upper half of the target range, mostly around the 180 mark.  They are spiking at night, though, and upon questioning, he is having a snack around 1 AM: 4 cookies.  He also drinks regular cranberry juice during the day.  I strongly advised him to stop any sweet drink and also stop eating at night.  I did suggest to increase the Lantus dose slightly, and I also advised him to try to fluctuate the dose of his mealtime insulin based on the size of the meal, since he is now taking fixed doses -Will continue [place] for now.  It is risky to restart GLP-1 receptor agonist for him, although this would be very helpful in the setting of increasing weight.  He is not able to exercise due to significant shortness of breath.  He has this even after taking a shower. - I suggested to:  Patient Instructions  Please continue: - Farxiga 10 mg before breakfast  Increase: - Lantus 26 units at bedtime  Change: - Novolog 8-12 units 2x a day, before meals  NO EATING IN THE MIDDLE OF THE NIGHT!!!  NO SWEET DRINKS: NO ICE TEA/REGULAR SODAS/JUICE!!!  Please return for another visit in 3 months.   - Strongly advised him to start checking sugars at different times of the day - check 4x a day, rotating checks - discussed about CBG targets for treatment: 80-130 mg/dL before meals and <956<180 mg/dL after meals; target [id number] <7%. - given sugar log and advised how to fill it and to bring it at next appt  - given foot care handout and explained the principles  - given instructions for hypoglycemia management "15-15 rule"  - advised for yearly eye exams  - Return to clinic in 3 mo  2. HL - Reviewed latest lipid panel from 08/2021: LDL above target of less than 55 due to history of cardiovascular disease, triglycerides elevated: Lab Results  Component Value Date   CHOL 178 08/26/2021   HDL 42 08/26/2021   LDLCALC 81 08/26/2021   TRIG 336 (H) 08/26/2021   CHOLHDL 4.2 08/26/2021  - Continues the statin  (Lipitor 10 mg daily) without side effects. - He will work on improving diet and will check his cholesterol levels at next visit   Carlus Pavlovristina Kean Gautreau, MD PhD [facility]

## 2022-09-18 NOTE — Patient Instructions (Addendum)
Please continue: - Farxiga 10 mg before breakfast  Increase: - Lantus 26 units at bedtime  Change: - Novolog 8-12 units 2x a day, before meals  NO EATING IN THE MIDDLE OF THE NIGHT!!!  NO SWEET DRINKS: NO ICE TEA/REGULAR SODAS/JUICE!!!  Please return for another visit in 3 months.   PATIENT INSTRUCTIONS FOR TYPE 2 DIABETES:  DIET AND EXERCISE Diet and exercise is an important part of diabetic treatment.  We recommended aerobic exercise in the form of brisk walking (working between 40-60% of maximal aerobic capacity, similar to brisk walking) for 150 minutes per week (such as 30 minutes five days per week) along with 3 times per week performing 'resistance' training (using various gauge rubber tubes with handles) 5-10 exercises involving the major muscle groups (upper body, lower body and core) performing 10-15 repetitions (or near fatigue) each exercise. Start at half the above goal but build slowly to reach the above goals. If limited by weight, joint pain, or disability, we recommend daily walking in a swimming pool with water up to waist to reduce pressure from joints while allow for adequate exercise.    BLOOD GLUCOSES Monitoring your blood glucoses is important for continued management of your diabetes. Please check your blood glucoses 2-4 times a day: fasting, before meals and at bedtime (you can rotate these measurements - e.g. one day check before the 3 meals, the next day check before 2 of the meals and before bedtime, etc.).   HYPOGLYCEMIA (low blood sugar) Hypoglycemia is usually a reaction to not eating, exercising, or taking too much insulin/ other diabetes drugs.  Symptoms include tremors, sweating, hunger, confusion, headache, etc. Treat IMMEDIATELY with 15 grams of Carbs: 4 glucose tablets  cup regular juice/soda 2 tablespoons raisins 4 teaspoons sugar 1 tablespoon honey Recheck blood glucose in 15 mins and repeat above if still symptomatic/blood glucose  <100.  RECOMMENDATIONS TO REDUCE YOUR RISK OF DIABETIC COMPLICATIONS: * Take your prescribed MEDICATION(S) * Follow a DIABETIC diet: Complex carbs, fiber rich foods, (monounsaturated and polyunsaturated) fats * AVOID saturated/trans fats, high fat foods, >2,300 mg salt per day. * EXERCISE at least 5 times a week for 30 minutes or preferably daily.  * DO NOT SMOKE OR DRINK more than 1 drink a day. * Check your FEET every day. Do not wear tightfitting shoes. Contact us if you develop an ulcer * See your EYE doctor once a year or more if needed * Get a FLU shot once a year * Get a PNEUMONIA vaccine once before and once after age 32 years  GOALS:  * Your Hemoglobin A1c of <7%  * fasting sugars need to be 80-130 * after meals sugars need to be <180 (2h after you start eating) * Your Systolic BP should be 130 or lower  * Your Diastolic BP should be 80 or lower  * Your HDL (Good Cholesterol) should be 40 or higher  * Your LDL (Bad Cholesterol) should be ideally <70. * Your Triglycerides should be 150 or lower  * Your Urine microalbumin (kidney function) should be <30 * Your Body Mass Index should be 25 or lower   Please consider the following ways to cut down carbs and fat and increase fiber and micronutrients in your diet: - substitute whole grain for white bread or pasta - substitute brown rice for white rice - substitute 90-calorie flat bread pieces for slices of bread when possible - substitute sweet potatoes or yams for white potatoes - substitute humus for margarine -  substitute tofu for cheese when possible - substitute almond or rice milk for regular milk (would not drink soy milk daily due to concern for soy estrogen influence on breast cancer risk) - substitute dark chocolate for other sweets when possible - substitute water - can add lemon or orange slices for taste - for diet sodas (artificial sweeteners will trick your body that you can eat sweets without getting calories and  will lead you to overeating and weight gain in the long run) - do not skip breakfast or other meals (this will slow down the metabolism and will result in more weight gain over time)  - can try smoothies made from fruit and almond/rice milk in am instead of regular breakfast - can also try old-fashioned (not instant) oatmeal made with almond/rice milk in am - order the dressing on the side when eating salad at a restaurant (pour less than half of the dressing on the salad) - eat as little meat as possible - can try juicing, but should not forget that juicing will get rid of the fiber, so would alternate with eating raw veg./fruits or drinking smoothies - use as little oil as possible, even when using olive oil - can dress a salad with a mix of balsamic vinegar and lemon juice, for e.g. - use agave nectar, stevia sugar, or regular sugar rather than artificial sweateners - steam or broil/roast veggies  - snack on veggies/fruit/nuts (unsalted, preferably) when possible, rather than processed foods - reduce or eliminate aspartame in diet (it is in diet sodas, chewing gum, etc) Read the labels!  Try to read Dr. Janene Harvey book: "Program for Reversing Diabetes" for other ideas for healthy eating.

## 2022-09-19 ENCOUNTER — Telehealth (HOSPITAL_COMMUNITY): Payer: Self-pay

## 2022-09-19 NOTE — Telephone Encounter (Signed)
Pt insurance is active and benefits verified through Medicaid. Co-pay $0.00, DED $0.00/$0.00 met, out of pocket $0.00/$0.00 met, co-insurance 0%. No pre-authorization required. Passport, 09/19/22 @ 2:34PM, REF#[id number]   How many CR sessions are covered? (36 sessions for TCR, 72 sessions for ICR)36 TCR Is this a lifetime maximum or an annual maximum? Annual Has the member used any of these services to date? No Is there a time limit (weeks/months) on start of program and/or program completion? 6 months     Will contact patient to see if he is interested in the Cardiac Rehab Program.

## 2022-09-19 NOTE — Telephone Encounter (Signed)
**  TCR only**  Called patient to see if he was interested in participating in the Cardiac Rehab Program. Patient stated yes. Patient will come in for orientation on 09/25/22 @ 1:15PM and will attend the 12:30PM exercise class. Went over insurance, patient verbalized understanding.  Pensions consultant.

## 2022-09-22 ENCOUNTER — Ambulatory Visit: Payer: Medicaid Other | Attending: Cardiology

## 2022-09-22 DIAGNOSIS — I5022 Chronic systolic (congestive) heart failure: Secondary | ICD-10-CM | POA: Diagnosis not present

## 2022-09-22 DIAGNOSIS — Z95810 Presence of automatic (implantable) cardiac defibrillator: Secondary | ICD-10-CM | POA: Diagnosis not present

## 2022-09-22 NOTE — Progress Notes (Signed)
EPIC Encounter for ICM Monitoring  Patient Name: [name] is a 57 y.o. male Date: 09/22/2022 Primary Care Physican: Marcine Matar, MD Primary Cardiologist: Jens Som Electrophysiologist: Lalla Brothers 06/17/2022 Office Weight: 251 lbs 08/04/2022 Weight: 271 lbs 08/12/2022 Weight: 271 lbs 09/22/2022 Weight: 281 lbs                            Spoke with patient and heart failure questions reviewed.  Transmission results reviewed.  Pt is concerned about weight gain of the last few months.  He has gained 10 lbs since 3/5.  His endocrinologist thinks weight gain could be a side effect of insulin.       [place]vue thoracic impedance suggesting possible fluid accumulation starting 4/8 and returned close to baseline 4/15.   Prescribed dosage:  Furosemide 80 mg take 1 tablet (80 mg total) as needed daily for fluid.    Potassium 20 mEq take 1 tablet by mouth twice a day Spironolactone 25 mg take 1 tablet (0.5 mg total) by mouth once a day.    Labs: 08/05/2022 Creatinine 2.14, BUN 22, Potassium 4.4, Sodium 141, GFR 35 06/16/2022 Creatinine 2.07, BUN 23, Potassium 4.1, Sodium 138, GFR 37  06/15/2022 Creatinine 2.33, BUN 25, Potassium 4.1, Sodium 137, GFR 32  06/14/2022 Creatinine 2.65, BUN 19, Potassium 4.4, Sodium 136, GFR 27  05/28/2022 Creatinine 2.22, BUN 25, Potassium 5.7, Sodium 137, GFR 34 05/26/2022 Creatinine 2.12, BUN 18, Potassium 5.0, Sodium 136, GFR 36  05/23/2022 Creatinine 2.02, BUN 19, Potassium 5.4, Sodium 140  05/17/2022 Creatinine 1.85, BUN 30, Potassium 5.4, Sodium 140, GFR 42  05/16/2022 Creatinine 1.85, BUN 31, Potassium 4.8, Sodium 136, GFR 42  A complete set of results can be found in Results Review.   Recommendations:  No changes and encouraged to call if experiencing any fluid symptoms.   Follow-up plan: ICM clinic phone appointment on 10/27/2022.   91 day device clinic remote transmission 10/14/2022.     EP/Cardiology Office Visits:  01/23/2023 with Dr Lalla Brothers.  02/25/2023  with Dr Lalla Brothers.  Recall 02/21/2023 with Dr Gasper Lloyd.   Copy of ICM check sent to Dr Lalla Brothers.   3 month ICM trend: 09/22/2022.    12-14 Month ICM trend:     Karie Soda, RN 09/22/2022 10:38 AM

## 2022-09-23 DIAGNOSIS — E119 Type 2 diabetes mellitus without complications: Secondary | ICD-10-CM | POA: Diagnosis not present

## 2022-09-23 DIAGNOSIS — H2513 Age-related nuclear cataract, bilateral: Secondary | ICD-10-CM | POA: Diagnosis not present

## 2022-09-23 DIAGNOSIS — H04123 Dry eye syndrome of bilateral lacrimal glands: Secondary | ICD-10-CM | POA: Diagnosis not present

## 2022-09-23 DIAGNOSIS — H40023 Open angle with borderline findings, high risk, bilateral: Secondary | ICD-10-CM | POA: Diagnosis not present

## 2022-09-23 LAB — HM DIABETES EYE EXAM

## 2022-09-24 ENCOUNTER — Telehealth (HOSPITAL_COMMUNITY): Payer: Self-pay

## 2022-09-24 NOTE — Telephone Encounter (Signed)
Pt called and confirmed Cardiac Rehab Orientation tomorrow at 1:15pm. RN also able to complete pt's nursing assessment. Directions and instructions given for the appointment. All questions answered. Pt understands without assistance.

## 2022-09-24 NOTE — Telephone Encounter (Signed)
-----   Message from Karie Soda, RN sent at 09/24/2022  1:43 PM EDT ----- Regarding: RE: Dizziness [name],   I spoke with him on 4/15 and Furosemide script has been changed to as needed per EPIC and patient due to his dizziness.   Pt did have a little fluid starting 4/8 but was almost back to normal on 4/15.  Advised him to call his cardiologist, Dr Jens Som if dizziness persist.    Thanks for the update.      4/8 and returned close to baseline 4/15 ----- Message ----- From: Essie Hart, RN Sent: 09/24/2022   1:39 PM EDT To: [name], RN; Karie Soda, RN Subject: Dizziness                                      [name],   I called this patient today about his Cardiac Rehab Orientation appointment tomorrow. He stated he has a call placed into you about his dizziness. He stated that he stopped taking his 80mg  of lasix. Can you please follow up with him?  I also advised him to get a BP cuff and starting logging his BPs. He stated he will go out and buy one when he can.

## 2022-09-24 NOTE — Telephone Encounter (Signed)
Called patient and updated him after contacting Randon Goldsmith, RN. Pt understands plan. Will see him tomorrow for Cardiac Rehab orientation.

## 2022-09-25 ENCOUNTER — Encounter (HOSPITAL_COMMUNITY)
Admission: RE | Admit: 2022-09-25 | Discharge: 2022-09-25 | Disposition: A | Discharge status: Home / Self Care | Payer: Medicaid Other | Source: Ambulatory Visit | Attending: Cardiology | Admitting: Cardiology

## 2022-09-25 VITALS — BP 114/76 | HR 62 | Ht 73.0 in | Wt 282.0 lb

## 2022-09-25 DIAGNOSIS — I5022 Chronic systolic (congestive) heart failure: Secondary | ICD-10-CM | POA: Insufficient documentation

## 2022-09-25 NOTE — Progress Notes (Signed)
Cardiac Rehab Medication Review by a nurse  Does the patient  feel that his/her medications are working for him/her?  yes  Has the patient been experiencing any side effects to the medications prescribed?  no  Does the patient measure his/her own blood pressure or blood glucose at home?  yes   Does the patient have any problems obtaining medications due to transportation or finances?   no  Understanding of regimen: good Understanding of indications: good Potential of compliance: excellent    Nurse comments: Patient reported feeling lightheaded this morning. Denies feeling lightheaded today at cardiac rehab orientation. Sitting blood pressure 114/76. Standing blood pressure 100/72. [name] says that he feels lightheaded on a daily basis. [name] says that he has not take Furosemide in a while as it makes him severely dizzy. Upon review of [name]'s medications he says he is taking entresto one tablet twice a day. Patient's medication list says to take 2 tablets twice a day. Left a message at the heart failure clinic to report patient's continued complaints of feeling lightheaded.  Will send today's vital signs after orientation and walk test completed to Dr Gasper Lloyd. Onsite provider Eligha Bridegroom notified about the patient's complaints.    [name] Bruce Garvey Westcott RN 09/25/2022 1:51 PM

## 2022-09-25 NOTE — Progress Notes (Signed)
Patient reports feeling a fluttering sensation his heart rate is in the mid 50's.  Denies chest pain. Says his heart feels different. He walked 2 miles this morning. Mr [name] says he started walking on Monday the first time since November. Today's ECG tracings shown to onsite provider Eligha Bridegroom NP. Marcelino Duster noted no ECG changes. Will proceed with 6 minute walk test. Telemetry rhythm Sinus, A paced on demand.Thayer Headings RN BSN

## 2022-09-25 NOTE — Progress Notes (Signed)
Cardiac Individual Treatment Plan  Patient Details  Name: [name] MRN: [id number] Date of Birth: July 21, 1965 Referring Provider:   Flowsheet Row CARDIAC REHAB PHASE II ORIENTATION from 09/25/2022 in [facility]  Referring Provider Olga Millers, MD       Initial Encounter Date:  Flowsheet Row CARDIAC REHAB PHASE II ORIENTATION from 09/25/2022 in [facility]  Date 09/25/22       Visit Diagnosis: Heart failure, chronic systolic  Patient's Home Medications on Admission:  Current Outpatient Medications:    Accu-Chek Softclix Lancets lancets, Use as instructed, Disp: 100 each, Rfl: 12   acetaminophen (TYLENOL) 500 MG tablet, Take 1,000 mg by mouth 2 (two) times daily., Disp: , Rfl:    albuterol (VENTOLIN HFA) 108 (90 Base) MCG/ACT inhaler, Inhale into the lungs every 6 (six) hours as needed for wheezing or shortness of breath., Disp: , Rfl:    amiodarone (PACERONE) 200 MG tablet, Take 1 tablet (200 mg total) by mouth daily., Disp: 90 tablet, Rfl: 3   aspirin 81 MG tablet, Take 81 mg by mouth daily., Disp: , Rfl:    atorvastatin (LIPITOR) 10 MG tablet, Take 1 tablet (10 mg total) by mouth daily., Disp: 90 tablet, Rfl: 1   benzonatate (TESSALON) 100 MG capsule, Take 1 capsule (100 mg total) by mouth 2 (two) times daily as needed for cough., Disp: 20 capsule, Rfl: 0   Blood Glucose Monitoring Suppl (ACCU-CHEK GUIDE) w/Device KIT, UAD, Disp: 1 kit, Rfl: 0   cetirizine (EQ ALLERGY RELIEF, CETIRIZINE,) 10 MG tablet, Take 1 tablet by mouth once daily, Disp: 90 tablet, Rfl: 1   cholecalciferol (VITAMIN D3) 25 MCG (1000 UNIT) tablet, Take 1,000 Units by mouth daily., Disp: , Rfl:    clobetasol cream (TEMOVATE) 0.05 %, Apply 1 Application topically as needed., Disp: , Rfl:    clonazePAM (KLONOPIN) 0.5 MG tablet, Take 1 tablet (0.5 mg total) by mouth 2 (two) times daily as  needed for anxiety., Disp: 60 tablet, Rfl: 0   Continuous Blood Gluc Sensor (FREESTYLE LIBRE 2 SENSOR) MISC, Use to check blood sugar TID. Change sensor once every 14 days. E11.65, Disp: 2 each, Rfl: 11   dapagliflozin propanediol (FARXIGA) 10 MG TABS tablet, Take 1 tablet (10 mg total) by mouth daily before breakfast., Disp: 90 tablet, Rfl: 3   EPINEPHrine 0.3 mg/0.3 mL IJ SOAJ injection, Inject 0.3 mg into the muscle as needed., Disp: 1 each, Rfl: 1   famotidine (PEPCID) 20 MG tablet, Take 1 tablet by mouth 1-2 times a day as needed. (Patient taking differently: Take 20 mg by mouth daily. Take 1 tablet by mouth 1-2 times a day as needed. Takes every day), Disp: 90 tablet, Rfl: 1   fluticasone (FLONASE) 50 MCG/ACT nasal spray, Use 1-2 sprays in each nostril once a day as needed for nasal congestion., Disp: 16 g, Rfl: 5   gabapentin (NEURONTIN) 300 MG capsule, TAKE 1 CAPSULE BY MOUTH EVERY DAY AT BEDTIME, Disp: 90 capsule, Rfl: 1   glucose blood (ACCU-CHEK GUIDE) test strip, Use as instructed, Disp: 100 each, Rfl: 12   glucose blood (FREESTYLE LITE) test strip, Use as instructed.  Please make sure this is compatible with [facility] 2 reader., Disp: 100 each, Rfl: 12   insulin aspart (NOVOLOG FLEXPEN) 100 UNIT/ML FlexPen, Inject 11 Units into the skin 2 (two) times daily before a meal., Disp: 15 mL, Rfl: 2  insulin glargine (LANTUS SOLOSTAR) 100 UNIT/ML Solostar Pen, Inject 32 Units into the skin daily., Disp: 15 mL, Rfl: 1   Insulin Pen Needle (PEN NEEDLES) 31G X 8 MM MISC, UAD, Disp: 100 each, Rfl: 6   ipratropium-albuterol (DUONEB) 0.5-2.5 (3) MG/3ML SOLN, Take 3 mLs by nebulization every 6 (six) hours as needed., Disp: 360 mL, Rfl: 2   Lancet Devices (ACCU-CHEK SOFTCLIX) lancets, 1 each by Other route 3 (three) times daily., Disp: 1 each, Rfl: 0   methocarbamol (ROBAXIN) 500 MG tablet, TAKE 1 TABLET BY MOUTH ONCE DAILY AS NEEDED FOR MUSCLE SPASM, Disp: 30 tablet, Rfl: 1   metoprolol succinate  (TOPROL-XL) 50 MG 24 hr tablet, Take 1 tablet (50 mg total) by mouth daily., Disp: 90 tablet, Rfl: 3   sacubitril-valsartan (ENTRESTO) 24-26 MG, Take 2 tablets by mouth 2 (two) times daily. Disregard previous refill requests. This is correct sig - 2 tablets twice daily, Disp: 60 tablet, Rfl: 6   sodium chloride (OCEAN) 0.65 % SOLN nasal spray, Place 1 spray into both nostrils as needed for congestion., Disp: 104 mL, Rfl: 0   spironolactone (ALDACTONE) 25 MG tablet, Take 0.5 tablets (12.5 mg total) by mouth daily., Disp: 30 tablet, Rfl: 5   tamsulosin (FLOMAX) 0.4 MG CAPS capsule, Take 2 capsules (0.8 mg total) by mouth daily., Disp: 180 capsule, Rfl: 1   triamcinolone cream (KENALOG) 0.1 %, Apply sparingly to red itchy areas twice daily as needed. Avoid face, neck, armpits or groin area. Do not use more than 3 weeks in a row., Disp: 45 g, Rfl: 5   furosemide (LASIX) 80 MG tablet, Take 1 tablet (80 mg total) by mouth daily as needed for edema or fluid. (Patient not taking: Reported on 09/25/2022), Disp: 180 tablet, Rfl: 1   sildenafil (VIAGRA) 100 MG tablet, Take 1-2 tabs PO 1/2-1 hr prior to intercourse. (Patient not taking: Reported on 09/25/2022), Disp: 20 tablet, Rfl: 4  Past Medical History: Past Medical History:  Diagnosis Date   AICD (automatic cardioverter/defibrillator) present    Chronic bronchitis    "get it most q yr" (12/23/2013)   Chronic systolic (congestive) heart failure    Fracture of left humerus    a. 07/2013.   GERD (gastroesophageal reflux disease)    not at present time   Heart murmur    "born w/it"    History of renal calculi    Hypertension    Kidney stones    NICM (nonischemic cardiomyopathy)    a. 07/2013 Echo: EF 20-25%, diff HK, Gr2 DD, mild MR, mod dil LA/RA. 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    Shockable heart rhythm detected by automated external defibrillator    Syncope    Type  II diabetes mellitus 2006    Tobacco Use: Social History   Tobacco Use  Smoking Status Former   Packs/day: 1.00   Years: 28.00   Additional pack years: 0.00   Total pack years: 28.00   Types: Cigarettes   Quit date: 06/03/2014   Years since quitting: 8.3  Smokeless Tobacco Never    Labs: Review Flowsheet  More data exists      Latest Ref Rng & Units 02/01/2022 05/03/2022 05/04/2022 05/12/2022 09/18/2022  Labs for ITP Cardiac and Pulmonary Rehab  Hemoglobin A1c 4.0 - 5.6 % - - 10.2  - 7.9   Bicarbonate 20.0 - 28.0 mmol/L 20.0 - 28.0 mmol/L 27.7  - -  20.7  19.9  -  TCO2 22 - 32 mmol/L 22 - 32 mmol/L 29  22  - 22  21  -  Acid-base deficit 0.0 - 2.0 mmol/L 0.0 - 2.0 mmol/L - - - 4.0  5.0  -  O2 Saturation % % 87  - - 63  63  -    Capillary Blood Glucose: Lab Results  Component Value Date   GLUCAP 148 (H) 06/16/2022   GLUCAP 174 (H) 06/16/2022   GLUCAP 184 (H) 06/15/2022   GLUCAP 175 (H) 06/15/2022   GLUCAP 163 (H) 06/15/2022     Exercise Target Goals: Exercise Program Goal: Individual exercise prescription set using results from initial 6 min walk test and THRR while considering  patient's activity barriers and safety.   Exercise Prescription Goal: Initial exercise prescription builds to 30-45 minutes a day of aerobic activity, 2-3 days per week.  Home exercise guidelines will be given to patient during program as part of exercise prescription that the participant will acknowledge.  Activity Barriers & Risk Stratification:  Activity Barriers & Cardiac Risk Stratification - 09/25/22 1609       Activity Barriers & Cardiac Risk Stratification   Activity Barriers Joint Problems;Back Problems;Shortness of Breath;Decreased Ventricular Function    Cardiac Risk Stratification High   <5METs on            6 Minute Walk:  6 Minute Walk     Row Name 09/25/22 1608         6 Minute Walk   Phase Initial     Distance 1109 feet     Walk Time 6 minutes     #  of Rest Breaks 0     MPH 2.1     METS 2.4     RPE 12     Perceived Dyspnea  1     VO2 Peak 8.32     Symptoms Yes (comment)     Comments 4/10 chronic low back pain, SOB. SOB resolved with rest     Resting HR 62 bpm     Resting BP 114/76     Resting Oxygen Saturation  97 %     Exercise Oxygen Saturation  during 6 min walk 97 %     Max Ex. HR 80 bpm     Max Ex. BP 112/68     2 Minute Post BP 118/80              Oxygen Initial Assessment:   Oxygen Re-Evaluation:   Oxygen Discharge (Final Oxygen Re-Evaluation):   Initial Exercise Prescription:  Initial Exercise Prescription - 09/25/22 1600       Date of Initial Exercise RX and Referring Provider   Date 09/25/22    Referring Provider Olga Millers, MD    Expected Discharge Date 11/07/22      Treadmill   MPH 1.7    Grade 0    Minutes 15    METs 2      NuStep   Level 1    SPM 75    Minutes 15    METs 2      Prescription Details   Frequency (times per week) 3    Duration Progress to 30 minutes of continuous aerobic without signs/symptoms of physical distress      Intensity   THRR 40-80% of Max Heartrate 65-130    Ratings of Perceived Exertion 11-13    Perceived Dyspnea 0-4      Progression   Progression  Continue progressive overload as per policy without signs/symptoms or physical distress.      Resistance Training   Training Prescription Yes    Weight 2    Reps 10-15             Perform Capillary Blood Glucose checks as needed.  Exercise Prescription Changes:   Exercise Comments:   Exercise Goals and Review:   Exercise Goals     Row Name 09/25/22 1352             Exercise Goals   Increase Physical Activity Yes       Intervention Provide advice, education, support and counseling about physical activity/exercise needs.;Develop an individualized exercise prescription for aerobic and resistive training based on initial evaluation findings, risk stratification, comorbidities and  participant's personal goals.       Expected Outcomes Short Term: Attend rehab on a regular basis to increase amount of physical activity.;Long Term: Exercising regularly at least 3-5 days a week.;Long Term: Add in home exercise to make exercise part of routine and to increase amount of physical activity.       Increase Strength and Stamina Yes       Intervention Provide advice, education, support and counseling about physical activity/exercise needs.;Develop an individualized exercise prescription for aerobic and resistive training based on initial evaluation findings, risk stratification, comorbidities and participant's personal goals.       Expected Outcomes Short Term: Increase workloads from initial exercise prescription for resistance, speed, and METs.;Short Term: Perform resistance training exercises routinely during rehab and add in resistance training at home;Long Term: Improve cardiorespiratory fitness, muscular endurance and strength as measured by increased METs and functional capacity ( )       Able to understand and use rate of perceived exertion (RPE) scale Yes       Intervention Provide education and explanation on how to use RPE scale       Expected Outcomes Short Term: Able to use RPE daily in rehab to express subjective intensity level;Long Term:  Able to use RPE to guide intensity level when exercising independently       Knowledge and understanding of Target Heart Rate Range (THRR) Yes       Intervention Provide education and explanation of THRR including how the numbers were predicted and where they are located for reference       Expected Outcomes Short Term: Able to state/look up THRR;Short Term: Able to use daily as guideline for intensity in rehab;Long Term: Able to use THRR to govern intensity when exercising independently       Understanding of Exercise Prescription Yes       Intervention Provide education, explanation, and written materials on patient's individual exercise  prescription       Expected Outcomes Short Term: Able to explain program exercise prescription;Long Term: Able to explain home exercise prescription to exercise independently                Exercise Goals Re-Evaluation :   Discharge Exercise Prescription (Final Exercise Prescription Changes):   Nutrition:  Target Goals: Understanding of nutrition guidelines, daily intake of sodium 1500mg , cholesterol 200mg , calories 30% from fat and 7% or less from saturated fats, daily to have 5 or more servings of fruits and vegetables.  Biometrics:  Pre Biometrics - 09/25/22 1323       Pre Biometrics   Waist Circumference 50.5 inches    Hip Circumference 47.75 inches    Waist to Hip Ratio 1.06 %  Triceps Skinfold 30 mm    % Body Fat 37.9 %    Grip Strength 30 kg    Flexibility 0 in   no done due to low back pain   Single Leg Stand 30 seconds              Nutrition Therapy Plan and Nutrition Goals:   Nutrition Assessments:  MEDIFICTS Score Key: ?70 Need to make dietary changes  40-70 Heart Healthy Diet ? 40 Therapeutic Level Cholesterol Diet    Picture Your Plate Scores: <16 Unhealthy dietary pattern with much room for improvement. 41-50 Dietary pattern unlikely to meet recommendations for good health and room for improvement. 51-60 More healthful dietary pattern, with some room for improvement.  >60 Healthy dietary pattern, although there may be some specific behaviors that could be improved.    Nutrition Goals Re-Evaluation:   Nutrition Goals Re-Evaluation:   Nutrition Goals Discharge (Final Nutrition Goals Re-Evaluation):   Psychosocial: Target Goals: Acknowledge presence or absence of significant depression and/or stress, maximize coping skills, provide positive support system. Participant is able to verbalize types and ability to use techniques and skills needed for reducing stress and depression.  Initial Review & Psychosocial Screening:  Initial Psych  Review & Screening - 09/25/22 1621       Initial Review   Current issues with Current Depression;Current Anxiety/Panic;Current Psychotropic Meds;Current Sleep Concerns      Family Dynamics   Good Support System? Yes   Gianfranco has his brother and sister for support   Comments Dameian shared that he has had some PTSD, anxiety, and trouble sleeping since his ICD fired in November. He is currently on Klonopin for his anxiety and feels it is helping. He has previously tried other medications for depression but did not tolerate them well. He is actively talking with his PCP about adjusting his medications and finding a regime that works for him. Ashaun is also starting counseling with a pyschiatrist Monday. Emotional support and resources offered, Jeanpaul feels good about his current plan.      Barriers   Psychosocial barriers to participate in program The patient should benefit from training in stress management and relaxation.      Screening Interventions   Interventions Encouraged to exercise;To provide support and resources with identified psychosocial needs;Provide feedback about the scores to participant    Expected Outcomes Short Term goal: Utilizing psychosocial counselor, staff and physician to assist with identification of specific Stressors or current issues interfering with healing process. Setting desired goal for each stressor or current issue identified.;Long Term Goal: Stressors or current issues are controlled or eliminated.;Short Term goal: Identification and review with participant of any Quality of Life or Depression concerns found by scoring the questionnaire.;Long Term goal: The participant improves quality of Life and PHQ9 Scores as seen by post scores and/or verbalization of changes             Quality of Life Scores:  Quality of Life - 09/25/22 1627       Quality of Life   Select Quality of Life      Quality of Life Scores   Health/Function Pre 16.3 %    Socioeconomic Pre  18.5 %    Psych/Spiritual Pre 16.21 %    Family Pre 20 %    GLOBAL Pre 17.06 %            Scores of 19 and below usually indicate a poorer quality of life in these areas.  A difference of  2-3 points is a clinically meaningful difference.  A difference of 2-3 points in the total score of the Quality of Life Index has been associated with significant improvement in overall quality of life, self-image, physical symptoms, and general health in studies assessing change in quality of life.  PHQ-9: Review Flowsheet  More data exists      09/25/2022 07/14/2022 07/10/2022 06/17/2022 03/04/2022  Depression screen PHQ 2/9  Decreased Interest 2 1  1 1   Down, Depressed, Hopeless 1 1  2 1   PHQ - 2 Score 3 2  3 2   Altered sleeping 2 1  1 1   Tired, decreased energy 1 1  0 1  Change in appetite 1 0  0 1  Feeling bad or failure about yourself  0 0  0 0  Trouble concentrating 1 1  0 0  Moving slowly or fidgety/restless 0 0  0 0  Suicidal thoughts 0 0  1 0  PHQ-9 Score 8 5  5 5   Difficult doing work/chores Somewhat difficult - - - -    Details       Information is confidential and restricted. Go to Review Flowsheets to unlock data.        Interpretation of Total Score  Total Score Depression Severity:  1-4 = Minimal depression, 5-9 = Mild depression, 10-14 = Moderate depression, 15-19 = Moderately severe depression, 20-27 = Severe depression   Psychosocial Evaluation and Intervention:   Psychosocial Re-Evaluation:   Psychosocial Discharge (Final Psychosocial Re-Evaluation):   Vocational Rehabilitation: Provide vocational rehab assistance to qualifying candidates.   Vocational Rehab Evaluation & Intervention:  Vocational Rehab - 09/25/22 1628       Initial Vocational Rehab Evaluation & Intervention   Assessment shows need for Vocational Rehabilitation No   Azan is on disability and stated he is unable to return to [employer]            Education: Education Goals: Education classes  will be provided on a weekly basis, covering required topics. Participant will state understanding/return demonstration of topics presented.     Core Videos: Exercise    Move It!  Clinical staff conducted group or individual video education with verbal and written material and guidebook.  Patient learns the recommended Pritikin exercise program. Exercise with the goal of living a long, healthy life. Some of the health benefits of exercise include controlled diabetes, healthier blood pressure levels, improved cholesterol levels, improved heart and lung capacity, improved sleep, and better body composition. Everyone should speak with their doctor before starting or changing an exercise routine.  Biomechanical Limitations Clinical staff conducted group or individual video education with verbal and written material and guidebook.  Patient learns how biomechanical limitations can impact exercise and how we can mitigate and possibly overcome limitations to have an impactful and balanced exercise routine.  Body Composition Clinical staff conducted group or individual video education with verbal and written material and guidebook.  Patient learns that body composition (ratio of muscle mass to fat mass) is a key component to assessing overall fitness, rather than body weight alone. Increased fat mass, especially visceral belly fat, can put [place] at increased risk for metabolic syndrome, type 2 diabetes, heart disease, and even death. It is recommended to combine diet and exercise (cardiovascular and resistance training) to improve your body composition. Seek guidance from your physician and exercise physiologist before implementing an exercise routine.  Exercise Action Plan Clinical staff conducted group or individual video education with verbal and written material and  guidebook.  Patient learns the recommended strategies to achieve and enjoy long-term exercise adherence, including variety, self-motivation,  self-efficacy, and positive decision making. Benefits of exercise include fitness, good health, weight management, more energy, better sleep, less stress, and overall well-being.  Medical   Heart Disease Risk Reduction Clinical staff conducted group or individual video education with verbal and written material and guidebook.  Patient learns our heart is our most vital organ as it circulates oxygen, nutrients, white blood cells, and hormones throughout the entire body, and carries waste away. Data supports a plant-based eating plan like the Pritikin Program for its effectiveness in slowing progression of and reversing heart disease. The video provides a number of recommendations to address heart disease.   Metabolic Syndrome and Belly Fat  Clinical staff conducted group or individual video education with verbal and written material and guidebook.  Patient learns what metabolic syndrome is, how it leads to heart disease, and how one can reverse it and keep it from coming back. You have metabolic syndrome if you have 3 of the following 5 criteria: abdominal obesity, high blood pressure, high triglycerides, low HDL cholesterol, and high blood sugar.  Hypertension and Heart Disease Clinical staff conducted group or individual video education with verbal and written material and guidebook.  Patient learns that high blood pressure, or hypertension, is very common in the [place]. Hypertension is largely due to excessive salt intake, but other important risk factors include being overweight, physical inactivity, drinking too much alcohol, smoking, and not eating enough potassium from fruits and vegetables. High blood pressure is a leading risk factor for heart attack, stroke, congestive heart failure, dementia, kidney failure, and premature death. Long-term effects of excessive salt intake include stiffening of the arteries and thickening of heart muscle and organ damage. Recommendations include ways to  reduce hypertension and the risk of heart disease.  Diseases of Our Time - Focusing on Diabetes Clinical staff conducted group or individual video education with verbal and written material and guidebook.  Patient learns why the best way to stop diseases of our time is prevention, through food and other lifestyle changes. Medicine (such as prescription pills and surgeries) is often only a Band-Aid on the problem, not a long-term solution. Most common diseases of our time include obesity, type 2 diabetes, hypertension, heart disease, and cancer. The Pritikin Program is recommended and has been proven to help reduce, reverse, and/or prevent the damaging effects of metabolic syndrome.  Nutrition   Overview of the Pritikin Eating Plan  Clinical staff conducted group or individual video education with verbal and written material and guidebook.  Patient learns about the Pritikin Eating Plan for disease risk reduction. The Pritikin Eating Plan emphasizes a wide variety of unrefined, minimally-processed carbohydrates, like fruits, vegetables, whole grains, and legumes. Go, Caution, and Stop food choices are explained. Plant-based and lean animal proteins are emphasized. Rationale provided for low sodium intake for blood pressure control, low added sugars for blood sugar stabilization, and low added fats and oils for coronary artery disease risk reduction and weight management.  Calorie Density  Clinical staff conducted group or individual video education with verbal and written material and guidebook.  Patient learns about calorie density and how it impacts the Pritikin Eating Plan. Knowing the characteristics of the food you choose will help you decide whether those foods will lead to weight gain or weight loss, and whether you want to consume more or less of them. Weight loss is usually a side effect of  the Pritikin Eating Plan because of its focus on low calorie-dense foods.  Label Reading  Clinical staff  conducted group or individual video education with verbal and written material and guidebook.  Patient learns about the Pritikin recommended label reading guidelines and corresponding recommendations regarding calorie density, added sugars, sodium content, and whole grains.  Dining Out - Part 1  Clinical staff conducted group or individual video education with verbal and written material and guidebook.  Patient learns that restaurant meals can be sabotaging because they can be so high in calories, fat, sodium, and/or sugar. Patient learns recommended strategies on how to positively address this and avoid unhealthy pitfalls.  Facts on Fats  Clinical staff conducted group or individual video education with verbal and written material and guidebook.  Patient learns that lifestyle modifications can be just as effective, if not more so, as many medications for lowering your risk of heart disease. A Pritikin lifestyle can help to reduce your risk of inflammation and atherosclerosis (cholesterol build-up, or plaque, in the artery walls). Lifestyle interventions such as dietary choices and physical activity address the cause of atherosclerosis. A review of the types of fats and their impact on blood cholesterol levels, along with dietary recommendations to reduce fat intake is also included.  Nutrition Action Plan  Clinical staff conducted group or individual video education with verbal and written material and guidebook.  Patient learns how to incorporate Pritikin recommendations into their lifestyle. Recommendations include planning and keeping personal health goals in mind as an important part of their success.  Healthy Mind-Set    Healthy Minds, Bodies, Hearts  Clinical staff conducted group or individual video education with verbal and written material and guidebook.  Patient learns how to identify when they are stressed. Video will discuss the impact of that stress, as well as the many benefits of  stress management. Patient will also be introduced to stress management techniques. The way we think, act, and feel has an impact on our hearts.  How Our Thoughts Can Heal Our Hearts  Clinical staff conducted group or individual video education with verbal and written material and guidebook.  Patient learns that negative thoughts can cause depression and anxiety. This can result in negative lifestyle behavior and serious health problems. Cognitive behavioral therapy is an effective method to help control our thoughts in order to change and improve our emotional outlook.  Additional Videos:  Exercise    Improving Performance  Clinical staff conducted group or individual video education with verbal and written material and guidebook.  Patient learns to use a non-linear approach by alternating intensity levels and lengths of time spent exercising to help burn more calories and lose more body fat. Cardiovascular exercise helps improve heart health, metabolism, hormonal balance, blood sugar control, and recovery from fatigue. Resistance training improves strength, endurance, balance, coordination, reaction time, metabolism, and muscle mass. Flexibility exercise improves circulation, posture, and balance. Seek guidance from your physician and exercise physiologist before implementing an exercise routine and learn your capabilities and proper form for all exercise.  Introduction to Yoga  Clinical staff conducted group or individual video education with verbal and written material and guidebook.  Patient learns about yoga, a discipline of the coming together of mind, breath, and body. The benefits of yoga include improved flexibility, improved range of motion, better posture and core strength, increased lung function, weight loss, and positive self-image. Yoga's heart health benefits include lowered blood pressure, healthier heart rate, decreased cholesterol and triglyceride levels, improved immune function,  and reduced stress. Seek guidance from your physician and exercise physiologist before implementing an exercise routine and learn your capabilities and proper form for all exercise.  Medical   Aging: Enhancing Your Quality of Life  Clinical staff conducted group or individual video education with verbal and written material and guidebook.  Patient learns key strategies and recommendations to stay in good physical health and enhance quality of life, such as prevention strategies, having an advocate, securing a Health Care Proxy and Power of Attorney, and keeping a list of medications and system for tracking them. It also discusses how to avoid risk for bone loss.  Biology of Weight Control  Clinical staff conducted group or individual video education with verbal and written material and guidebook.  Patient learns that weight gain occurs because we consume more calories than we burn (eating more, moving less). Even if your body weight is normal, you may have higher ratios of fat compared to muscle mass. Too much body fat puts you at increased risk for cardiovascular disease, heart attack, stroke, type 2 diabetes, and obesity-related cancers. In addition to exercise, following the Pritikin Eating Plan can help reduce your risk.  Decoding Lab Results  Clinical staff conducted group or individual video education with verbal and written material and guidebook.  Patient learns that lab test reflects one measurement whose values change over time and are influenced by many factors, including medication, stress, sleep, exercise, food, hydration, pre-existing medical conditions, and more. It is recommended to use the knowledge from this video to become more involved with your lab results and evaluate your numbers to speak with your doctor.   Diseases of Our Time - Overview  Clinical staff conducted group or individual video education with verbal and written material and guidebook.  Patient learns that  according to the CDC, 50% to 70% of chronic diseases (such as obesity, type 2 diabetes, elevated lipids, hypertension, and heart disease) are avoidable through lifestyle improvements including healthier food choices, listening to satiety cues, and increased physical activity.  Sleep Disorders Clinical staff conducted group or individual video education with verbal and written material and guidebook.  Patient learns how good quality and duration of sleep are important to overall health and well-being. Patient also learns about sleep disorders and how they impact health along with recommendations to address them, including discussing with a physician.  Nutrition  Dining Out - Part 2 Clinical staff conducted group or individual video education with verbal and written material and guidebook.  Patient learns how to plan ahead and communicate in order to maximize their dining experience in a healthy and nutritious manner. Included are recommended food choices based on the type of restaurant the patient is visiting.   Fueling a Banker conducted group or individual video education with verbal and written material and guidebook.  There is a strong connection between our food choices and our health. Diseases like obesity and type 2 diabetes are very prevalent and are in large-part due to lifestyle choices. The Pritikin Eating Plan provides plenty of food and hunger-curbing satisfaction. It is easy to follow, affordable, and helps reduce health risks.  Menu Workshop  Clinical staff conducted group or individual video education with verbal and written material and guidebook.  Patient learns that restaurant meals can sabotage health goals because they are often packed with calories, fat, sodium, and sugar. Recommendations include strategies to plan ahead and to communicate with the manager, chef, or server to help order a healthier meal.  Planning Your Eating Strategy  Clinical staff  conducted group or individual video education with verbal and written material and guidebook.  Patient learns about the Pritikin Eating Plan and its benefit of reducing the risk of disease. The Pritikin Eating Plan does not focus on calories. Instead, it emphasizes high-quality, nutrient-rich foods. By knowing the characteristics of the foods, we choose, we can determine their calorie density and make informed decisions.  Targeting Your Nutrition Priorities  Clinical staff conducted group or individual video education with verbal and written material and guidebook.  Patient learns that lifestyle habits have a tremendous impact on disease risk and progression. This video provides eating and physical activity recommendations based on your personal health goals, such as reducing LDL cholesterol, losing weight, preventing or controlling type 2 diabetes, and reducing high blood pressure.  Vitamins and Minerals  Clinical staff conducted group or individual video education with verbal and written material and guidebook.  Patient learns different ways to obtain key vitamins and minerals, including through a recommended healthy diet. It is important to discuss all supplements you take with your doctor.   Healthy Mind-Set    Smoking Cessation  Clinical staff conducted group or individual video education with verbal and written material and guidebook.  Patient learns that cigarette smoking and tobacco addiction pose a serious health risk which affects millions of people. Stopping smoking will significantly reduce the risk of heart disease, lung disease, and many forms of cancer. Recommended strategies for quitting are covered, including working with your doctor to develop a successful plan.  Culinary   Becoming a Set designer conducted group or individual video education with verbal and written material and guidebook.  Patient learns that cooking at home can be healthy, cost-effective,  quick, and puts them in control. Keys to cooking healthy recipes will include looking at your recipe, assessing your equipment needs, planning ahead, making it simple, choosing cost-effective seasonal ingredients, and limiting the use of added fats, salts, and sugars.  Cooking - Breakfast and Snacks  Clinical staff conducted group or individual video education with verbal and written material and guidebook.  Patient learns how important breakfast is to satiety and nutrition through the entire day. Recommendations include key foods to eat during breakfast to help stabilize blood sugar levels and to prevent overeating at meals later in the day. Planning ahead is also a key component.  Cooking - Educational psychologist conducted group or individual video education with verbal and written material and guidebook.  Patient learns eating strategies to improve overall health, including an approach to cook more at home. Recommendations include thinking of animal protein as a side on your plate rather than center stage and focusing instead on lower calorie dense options like vegetables, fruits, whole grains, and plant-based proteins, such as beans. Making sauces in large quantities to freeze for later and leaving the skin on your vegetables are also recommended to maximize your experience.  Cooking - Healthy Salads and Dressing Clinical staff conducted group or individual video education with verbal and written material and guidebook.  Patient learns that vegetables, fruits, whole grains, and legumes are the foundations of the Pritikin Eating Plan. Recommendations include how to incorporate each of these in flavorful and healthy salads, and how to create homemade salad dressings. Proper handling of ingredients is also covered. Cooking - Soups and [employer] - Soups and Desserts Clinical staff conducted group or individual video education with verbal and written material and guidebook.  Patient  learns that Pritikin soups and desserts make for easy, nutritious, and delicious snacks and meal components that are low in sodium, fat, sugar, and calorie density, while high in vitamins, minerals, and filling fiber. Recommendations include simple and healthy ideas for soups and desserts.   Overview     The Pritikin Solution Program Overview Clinical staff conducted group or individual video education with verbal and written material and guidebook.  Patient learns that the results of the Pritikin Program have been documented in more than 100 articles published in peer-reviewed journals, and the benefits include reducing risk factors for (and, in some cases, even reversing) high cholesterol, high blood pressure, type 2 diabetes, obesity, and more! An overview of the three key pillars of the Pritikin Program will be covered: eating well, doing regular exercise, and having a healthy mind-set.  WORKSHOPS  Exercise: Exercise Basics: Building Your Action Plan Clinical staff led group instruction and group discussion with PowerPoint presentation and patient guidebook. To enhance the learning environment the use of posters, models and videos may be added. At the conclusion of this workshop, patients will comprehend the difference between physical activity and exercise, as well as the benefits of incorporating both, into their routine. Patients will understand the FITT (Frequency, Intensity, Time, and Type) principle and how to use it to build an exercise action plan. In addition, safety concerns and other considerations for exercise and cardiac rehab will be addressed by the presenter. The purpose of this lesson is to promote a comprehensive and effective weekly exercise routine in order to improve patients' overall level of fitness.   Managing Heart Disease: Your Path to a Healthier Heart Clinical staff led group instruction and group discussion with PowerPoint presentation and patient guidebook. To  enhance the learning environment the use of posters, models and videos may be added.At the conclusion of this workshop, patients will understand the anatomy and physiology of the heart. Additionally, they will understand how Pritikin's three pillars impact the risk factors, the progression, and the management of heart disease.  The purpose of this lesson is to provide a high-level overview of the heart, heart disease, and how the Pritikin lifestyle positively impacts risk factors.  Exercise Biomechanics Clinical staff led group instruction and group discussion with PowerPoint presentation and patient guidebook. To enhance the learning environment the use of posters, models and videos may be added. Patients will learn how the structural parts of their bodies function and how these functions impact their daily activities, movement, and exercise. Patients will learn how to promote a neutral spine, learn how to manage pain, and identify ways to improve their physical movement in order to promote healthy living. The purpose of this lesson is to expose patients to common physical limitations that impact physical activity. Participants will learn practical ways to adapt and manage aches and pains, and to minimize their effect on regular exercise. Patients will learn how to maintain good posture while sitting, walking, and lifting.  Balance Training and Fall Prevention  Clinical staff led group instruction and group discussion with PowerPoint presentation and patient guidebook. To enhance the learning environment the use of posters, models and videos may be added. At the conclusion of this workshop, patients will understand the importance of their sensorimotor skills (vision, proprioception, and the vestibular system) in maintaining their ability to balance as they age. Patients will apply a variety of balancing exercises that are appropriate for their current level of function. Patients will understand  the common causes for poor  balance, possible solutions to these problems, and ways to modify their physical environment in order to minimize their fall risk. The purpose of this lesson is to teach patients about the importance of maintaining balance as they age and ways to minimize their risk of falling.  WORKSHOPS   Nutrition:  Fueling a Ship broker led group instruction and group discussion with PowerPoint presentation and patient guidebook. To enhance the learning environment the use of posters, models and videos may be added. Patients will review the foundational principles of the Pritikin Eating Plan and understand what constitutes a serving size in each of the food groups. Patients will also learn Pritikin-friendly foods that are better choices when away from home and review make-ahead meal and snack options. Calorie density will be reviewed and applied to three nutrition priorities: weight maintenance, weight loss, and weight gain. The purpose of this lesson is to reinforce (in a group setting) the key concepts around what patients are recommended to eat and how to apply these guidelines when away from home by planning and selecting Pritikin-friendly options. Patients will understand how calorie density may be adjusted for different weight management goals.  Mindful Eating  Clinical staff led group instruction and group discussion with PowerPoint presentation and patient guidebook. To enhance the learning environment the use of posters, models and videos may be added. Patients will briefly review the concepts of the Pritikin Eating Plan and the importance of low-calorie dense foods. The concept of mindful eating will be introduced as well as the importance of paying attention to internal hunger signals. Triggers for non-hunger eating and techniques for dealing with triggers will be explored. The purpose of this lesson is to provide patients with the opportunity to review the basic  principles of the Pritikin Eating Plan, discuss the value of eating mindfully and how to measure internal cues of hunger and fullness using the Hunger Scale. Patients will also discuss reasons for non-hunger eating and learn strategies to use for controlling emotional eating.  Targeting Your Nutrition Priorities Clinical staff led group instruction and group discussion with PowerPoint presentation and patient guidebook. To enhance the learning environment the use of posters, models and videos may be added. Patients will learn how to determine their genetic susceptibility to disease by reviewing their family history. Patients will gain insight into the importance of diet as part of an overall healthy lifestyle in mitigating the impact of genetics and other environmental insults. The purpose of this lesson is to provide patients with the opportunity to assess their personal nutrition priorities by looking at their family history, their own health history and current risk factors. Patients will also be able to discuss ways of prioritizing and modifying the Pritikin Eating Plan for their highest risk areas  Menu  Clinical staff led group instruction and group discussion with PowerPoint presentation and patient guidebook. To enhance the learning environment the use of posters, models and videos may be added. Using menus brought in from [employer], or printed from [employer], patients will apply the Pritikin dining out guidelines that were presented in the [employer] video. Patients will also be able to practice these guidelines in a variety of provided scenarios. The purpose of this lesson is to provide patients with the opportunity to practice hands-on learning of the Pritikin Dining Out guidelines with actual menus and practice scenarios.  Label Reading Clinical staff led group instruction and group discussion with PowerPoint presentation and patient guidebook. To enhance the  learning  environment the use of posters, models and videos may be added. Patients will review and discuss the Pritikin label reading guidelines presented in Pritikin's Label Reading Educational series video. Using fool labels brought in from local grocery stores and markets, patients will apply the label reading guidelines and determine if the packaged food meet the Pritikin guidelines. The purpose of this lesson is to provide patients with the opportunity to review, discuss, and practice hands-on learning of the Pritikin Label Reading guidelines with actual packaged food labels. Cooking School  Pritikin's [employer] are designed to teach patients ways to prepare quick, simple, and affordable recipes at home. The importance of nutrition's role in chronic disease risk reduction is reflected in its emphasis in the overall Pritikin program. By learning how to prepare essential core Pritikin Eating Plan recipes, patients will increase control over what they eat; be able to customize the flavor of foods without the use of added salt, sugar, or fat; and improve the quality of the food they consume. By learning a set of core recipes which are easily assembled, quickly prepared, and affordable, patients are more likely to prepare more healthy foods at home. These workshops focus on convenient breakfasts, simple entres, side dishes, and desserts which can be prepared with minimal effort and are consistent with nutrition recommendations for cardiovascular risk reduction. Cooking [employer] are taught by a Armed forces logistics/support/administrative officer (RD) who has been trained by the [employer]. The chef or RD has a clear understanding of the importance of minimizing - if not completely eliminating - added fat, sugar, and sodium in recipes. Throughout the series of Cooking School Workshop sessions, patients will learn about healthy ingredients and efficient methods of cooking to build confidence in their  capability to prepare    Cooking School weekly topics:  Adding Flavor- Sodium-Free  Fast and Healthy Breakfasts  Powerhouse Plant-Based Proteins  Satisfying Salads and Dressings  Simple Sides and Sauces  International Cuisine-Spotlight on the [employer] Zones  Delicious Desserts  Savory Soups  [employer] - Meals in a Astronomer Appetizers and Snacks  Comforting Weekend Breakfasts  One-Pot Wonders   Fast Evening Meals  Landscape architect Your Pritikin Plate  WORKSHOPS   Healthy Mindset (Psychosocial):  Focused Goals, Sustainable Changes Clinical staff led group instruction and group discussion with PowerPoint presentation and patient guidebook. To enhance the learning environment the use of posters, models and videos may be added. Patients will be able to apply effective goal setting strategies to establish at least one personal goal, and then take consistent, meaningful action toward that goal. They will learn to identify common barriers to achieving personal goals and develop strategies to overcome them. Patients will also gain an understanding of how our mind-set can impact our ability to achieve goals and the importance of cultivating a positive and growth-oriented mind-set. The purpose of this lesson is to provide patients with a deeper understanding of how to set and achieve personal goals, as well as the tools and strategies needed to overcome common obstacles which may arise along the way.  From Head to Heart: The Power of a Healthy Outlook  Clinical staff led group instruction and group discussion with PowerPoint presentation and patient guidebook. To enhance the learning environment the use of posters, models and videos may be added. Patients will be able to recognize and describe the impact of emotions and mood on physical health. They will discover the importance of self-care and explore self-care practices  which may work for them. Patients will also learn how  to utilize the 4 C's to cultivate a healthier outlook and better manage stress and challenges. The purpose of this lesson is to demonstrate to patients how a healthy outlook is an essential part of maintaining good health, especially as they continue their cardiac rehab journey.  Healthy Sleep for a Healthy Heart Clinical staff led group instruction and group discussion with PowerPoint presentation and patient guidebook. To enhance the learning environment the use of posters, models and videos may be added. At the conclusion of this workshop, patients will be able to demonstrate knowledge of the importance of sleep to overall health, well-being, and quality of life. They will understand the symptoms of, and treatments for, common sleep disorders. Patients will also be able to identify daytime and nighttime behaviors which impact sleep, and they will be able to apply these tools to help manage sleep-related challenges. The purpose of this lesson is to provide patients with a general overview of sleep and outline the importance of quality sleep. Patients will learn about a few of the most common sleep disorders. Patients will also be introduced to the concept of "sleep hygiene," and discover ways to self-manage certain sleeping problems through simple daily behavior changes. Finally, the workshop will motivate patients by clarifying the links between quality sleep and their goals of heart-healthy living.   Recognizing and Reducing Stress Clinical staff led group instruction and group discussion with PowerPoint presentation and patient guidebook. To enhance the learning environment the use of posters, models and videos may be added. At the conclusion of this workshop, patients will be able to understand the types of stress reactions, differentiate between acute and chronic stress, and recognize the impact that chronic stress has on their health. They will also be able to apply different coping mechanisms, such as  reframing negative self-talk. Patients will have the opportunity to practice a variety of stress management techniques, such as deep abdominal breathing, progressive muscle relaxation, and/or guided imagery.  The purpose of this lesson is to educate patients on the role of stress in their lives and to provide healthy techniques for coping with it.  Learning Barriers/Preferences:  Learning Barriers/Preferences - 09/25/22 1627       Learning Barriers/Preferences   Learning Barriers Sight   wears glasses   Learning Preferences Audio;Computer/Internet;Group Instruction;Individual Instruction;Pictoral;Skilled Demonstration;Verbal Instruction;Video;Written Material             Education Topics:  Knowledge Questionnaire Score:  Knowledge Questionnaire Score - 09/25/22 1627       Knowledge Questionnaire Score   Pre Score 19/24             Core Components/Risk Factors/Patient Goals at Admission:  Personal Goals and Risk Factors at Admission - 09/25/22 1353       Core Components/Risk Factors/Patient Goals on Admission    Weight Management Yes;Obesity;Weight Loss    Intervention Weight Management: Develop a combined nutrition and exercise program designed to reach desired caloric intake, while maintaining appropriate intake of nutrient and fiber, sodium and fats, and appropriate energy expenditure required for the weight goal.;Weight Management: Provide education and appropriate resources to help participant work on and attain dietary goals.;Weight Management/Obesity: Establish reasonable short term and long term weight goals.;Obesity: Provide education and appropriate resources to help participant work on and attain dietary goals.    Admit Weight 282 lb 13.6 oz (128.3 kg)    Diabetes Yes    Intervention Provide education about signs/symptoms and action to  take for hypo/hyperglycemia.;Provide education about proper nutrition, including hydration, and aerobic/resistive exercise  prescription along with prescribed medications to achieve blood glucose in normal ranges: Fasting glucose 65-99 mg/dL    Expected Outcomes Short Term: Participant verbalizes understanding of the signs/symptoms and immediate care of hyper/hypoglycemia, proper foot care and importance of medication, aerobic/resistive exercise and nutrition plan for blood glucose control.;Long Term: Attainment of HbA1C < 7%.    Heart Failure Yes    Intervention Provide a combined exercise and nutrition program that is supplemented with education, support and counseling about heart failure. Directed toward relieving symptoms such as shortness of breath, decreased exercise tolerance, and extremity edema.    Expected Outcomes Short term: Attendance in program 2-3 days a week with increased exercise capacity. Reported lower sodium intake. Reported increased fruit and vegetable intake. Reports medication compliance.;Improve functional capacity of life;Short term: Daily weights obtained and reported for increase. Utilizing diuretic protocols set by physician.;Long term: Adoption of self-care skills and reduction of barriers for early signs and symptoms recognition and intervention leading to self-care maintenance.    Hypertension Yes    Intervention Provide education on lifestyle modifcations including regular physical activity/exercise, weight management, moderate sodium restriction and increased consumption of fresh fruit, vegetables, and low fat dairy, alcohol moderation, and smoking cessation.;Monitor prescription use compliance.    Expected Outcomes Short Term: Continued assessment and intervention until BP is < 140/38mm HG in hypertensive participants. < 130/74mm HG in hypertensive participants with diabetes, heart failure or chronic kidney disease.;Long Term: Maintenance of blood pressure at goal levels.    Lipids Yes    Intervention Provide education and support for participant on nutrition & aerobic/resistive exercise along  with prescribed medications to achieve LDL 70mg , HDL >40mg .    Expected Outcomes Short Term: Participant states understanding of desired cholesterol values and is compliant with medications prescribed. Participant is following exercise prescription and nutrition guidelines.;Long Term: Cholesterol controlled with medications as prescribed, with individualized exercise RX and with personalized nutrition plan. Value goals: LDL < 70mg , HDL > 40 mg.    Stress Yes    Intervention Refer participants experiencing significant psychosocial distress to appropriate mental health specialists for further evaluation and treatment. When possible, include family members and significant others in education/counseling sessions.;Offer individual and/or small group education and counseling on adjustment to heart disease, stress management and health-related lifestyle change. Teach and support self-help strategies.    Expected Outcomes Short Term: Participant demonstrates changes in health-related behavior, relaxation and other stress management skills, ability to obtain effective social support, and compliance with psychotropic medications if prescribed.;Long Term: Emotional wellbeing is indicated by absence of clinically significant psychosocial distress or social isolation.             Core Components/Risk Factors/Patient Goals Review:    Core Components/Risk Factors/Patient Goals at Discharge (Final Review):    ITP Comments:  ITP Comments     Row Name 09/25/22 1332           ITP Comments Dr. Armanda Magic medical director. Introduction to [facility] education program/ intensive cardiac rehab. Initital orientation packet reviewed with patient.                Comments: Participant attended orientation for the cardiac rehabilitation program on  09/25/2022  to perform initial intake and exercise walk test. Patient introduced to the [facility] education and orientation packet was reviewed. Completed  6-minute walk test, measurements, initial ITP, and exercise prescription. Vital signs stable. Telemetry-normal sinus rhythm A-Paced, asymptomatic.  Service time was from 1315 to 1525.  Jonna Coup, MS 09/25/2022 4:32 PM

## 2022-09-28 ENCOUNTER — Encounter: Payer: Self-pay | Admitting: Internal Medicine

## 2022-09-29 ENCOUNTER — Other Ambulatory Visit: Payer: Self-pay | Admitting: Internal Medicine

## 2022-09-29 DIAGNOSIS — F411 Generalized anxiety disorder: Secondary | ICD-10-CM | POA: Diagnosis not present

## 2022-09-29 MED ORDER — METHOCARBAMOL 500 MG PO TABS: ORAL_TABLET | ORAL | 2 refills | Status: DC

## 2022-09-30 ENCOUNTER — Encounter (HOSPITAL_COMMUNITY): Payer: Self-pay

## 2022-09-30 ENCOUNTER — Encounter (HOSPITAL_COMMUNITY): Payer: Medicaid Other | Admitting: Psychiatry

## 2022-09-30 NOTE — Progress Notes (Signed)
This encounter was created in error - please disregard.

## 2022-10-01 ENCOUNTER — Ambulatory Visit (HOSPITAL_BASED_OUTPATIENT_CLINIC_OR_DEPARTMENT_OTHER): Payer: Medicaid Other | Admitting: Psychiatry

## 2022-10-01 ENCOUNTER — Encounter (HOSPITAL_COMMUNITY): Payer: Self-pay

## 2022-10-01 ENCOUNTER — Encounter (HOSPITAL_COMMUNITY): Payer: Self-pay | Admitting: Psychiatry

## 2022-10-01 ENCOUNTER — Encounter (HOSPITAL_COMMUNITY)
Admission: RE | Admit: 2022-10-01 | Discharge: 2022-10-01 | Disposition: A | Discharge status: Home / Self Care | Payer: Medicaid Other | Source: Ambulatory Visit | Attending: Cardiology | Admitting: Cardiology

## 2022-10-01 VITALS — BP 124/84 | HR 63 | Ht 73.0 in | Wt 276.0 lb

## 2022-10-01 DIAGNOSIS — F410 Panic disorder [episodic paroxysmal anxiety] without agoraphobia: Secondary | ICD-10-CM | POA: Diagnosis not present

## 2022-10-01 DIAGNOSIS — F4310 Post-traumatic stress disorder, unspecified: Secondary | ICD-10-CM

## 2022-10-01 DIAGNOSIS — F411 Generalized anxiety disorder: Secondary | ICD-10-CM

## 2022-10-01 DIAGNOSIS — I5022 Chronic systolic (congestive) heart failure: Secondary | ICD-10-CM

## 2022-10-01 MED ORDER — CLONAZEPAM 0.5 MG PO TABS
ORAL_TABLET | ORAL | 1 refills | Status: DC
Start: 2022-10-01 — End: 2022-11-05
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
## 2022-10-01 NOTE — Progress Notes (Signed)
Incomplete Session Note  Patient Details  Name: [name] MRN: [id number] Date of Birth: 1965-10-07 Referring Provider:   Flowsheet Row CARDIAC REHAB PHASE II ORIENTATION from 09/25/2022 in [facility]  Referring Provider Olga Millers, MD       [name] did not complete his rehab session.  [name] did not exercise this afternoon as he did not eat prior to coming to cardiac rehab. CBG 198 per CGM. Blood pressure 100/70. Weight 126.2 kg. Reviewed and gave [name] a copy of the diabetic guidelines. [name] plans to return to exercise on Friday.Thayer Headings RN BSN

## 2022-10-03 ENCOUNTER — Ambulatory Visit: Payer: Medicaid Other | Admitting: Adult Health

## 2022-10-03 ENCOUNTER — Encounter (HOSPITAL_COMMUNITY)
Admission: RE | Admit: 2022-10-03 | Discharge: 2022-10-03 | Disposition: A | Discharge status: Home / Self Care | Payer: Medicaid Other | Source: Ambulatory Visit | Attending: Cardiology | Admitting: Cardiology

## 2022-10-03 DIAGNOSIS — I5022 Chronic systolic (congestive) heart failure: Secondary | ICD-10-CM | POA: Diagnosis not present

## 2022-10-03 LAB — GLUCOSE, CAPILLARY
Glucose-Capillary: 217 mg/dL — ABNORMAL HIGH (ref 70–99)
Glucose-Capillary: 209 mg/dL — ABNORMAL HIGH (ref 70–99)

## 2022-10-03 NOTE — Progress Notes (Signed)
Incomplete Session Note  Patient Details  Name: [name] MRN: [id number] Date of Birth: 1965/08/27 Referring Provider:   Flowsheet Row CARDIAC REHAB PHASE II ORIENTATION from 09/25/2022 in [facility]  Referring Provider Olga Millers, MD       [name] did not complete his rehab session.  [name] was walking on the treadmill for about 8-9 minutes when staff was alerted that he felt dizzy and lightheaded. Pt assisted to chair and BP 110/72, HR WNL and O2 sat WNL. Pt denied CP, SOB. Previous BP was 176/80 after 5 min on treadmill. Pt's BS prior to starting exercise was 217 and 239 with his episode. Pt stated he did not eat breakfast but ate banana and peanut butter prior to class. Water given and patient able to sit and rest. Pt started feeling better after 20 minutes. Pt sat in chair and performed cool down/meditation. Pt left class in stable condition with BP of 135/95, BS 205.

## 2022-10-06 ENCOUNTER — Encounter: Payer: Self-pay | Admitting: Podiatry

## 2022-10-06 ENCOUNTER — Ambulatory Visit (INDEPENDENT_AMBULATORY_CARE_PROVIDER_SITE_OTHER): Payer: Medicaid Other | Admitting: Podiatry

## 2022-10-06 ENCOUNTER — Encounter (HOSPITAL_COMMUNITY): Payer: Medicaid Other

## 2022-10-06 VITALS — BP 107/67

## 2022-10-06 DIAGNOSIS — B351 Tinea unguium: Secondary | ICD-10-CM | POA: Diagnosis not present

## 2022-10-06 DIAGNOSIS — M79675 Pain in left toe(s): Secondary | ICD-10-CM

## 2022-10-06 DIAGNOSIS — E1142 Type 2 diabetes mellitus with diabetic polyneuropathy: Secondary | ICD-10-CM

## 2022-10-06 DIAGNOSIS — M2141 Flat foot [pes planus] (acquired), right foot: Secondary | ICD-10-CM | POA: Diagnosis not present

## 2022-10-06 DIAGNOSIS — M2142 Flat foot [pes planus] (acquired), left foot: Secondary | ICD-10-CM

## 2022-10-06 DIAGNOSIS — E119 Type 2 diabetes mellitus without complications: Secondary | ICD-10-CM | POA: Diagnosis not present

## 2022-10-06 DIAGNOSIS — M79674 Pain in right toe(s): Secondary | ICD-10-CM | POA: Diagnosis not present

## 2022-10-07 NOTE — Progress Notes (Unsigned)
@Patient  ID: [name], male    DOB: January 08, 1966, 57 y.o.   MRN: [id number]  No chief complaint on file.   Referring provider: Marcine Matar, MD  HPI: 57 year old male, former smoker.  Past medical history significant for chronic systolic heart failure, cardiac arrest, hypertension, asthmatic bronchitis, GERD, IBS, 2 diabetes, chronic kidney disease, renal hernia repair, PTSD.  Former patient of Dr. Kendrick Fries, last seen by Dr. Celine Mans on 09/04/2022 for COPD exacerbation.   10/09/2022 Patient presents today for follow-up.   He was seen for acute office visit in March for symptoms of shortness of breath, cough, chest congestion and wheezing.  Given prescription for doxycycline.  Advised to use DuoNebs every 4 hours as needed.  Patient is on as needed DuoNebs for COPD.  He had no previous airflow limitation on PFTs in 2017.  Ordered for repeat pulmonary function testing but not completed     Allergies  Allergen Reactions   Bee Venom Anaphylaxis   Trulicity [Dulaglutide] Diarrhea    Extra dose of Trulicity caused cardiac arrest. Patient experience onset of diarrhea with using this medication - cleared after med stopped.    Immunization History  Administered Date(s) Administered   PFIZER(Purple Top)SARS-COV-2 Vaccination 10/21/2019    Past Medical History:  Diagnosis Date   AICD (automatic cardioverter/defibrillator) present    Chronic bronchitis (HCC)    "get it most q yr" (12/23/2013)   Chronic systolic (congestive) heart failure (HCC)    Fracture of left humerus    a. 07/2013.   GERD (gastroesophageal reflux disease)    not at present time   Heart murmur    "born w/it"    History of renal calculi    Hypertension    Kidney stones    NICM (nonischemic cardiomyopathy) (HCC)    a. 07/2013 Echo: EF 20-25%, diff HK, Gr2 DD, mild MR, mod dil LA/RA. 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     Shockable heart rhythm detected by automated external defibrillator    Syncope    Type II diabetes mellitus (HCC) 2006    Tobacco History: Social History   Tobacco Use  Smoking Status Former   Packs/day: 1.00   Years: 28.00   Additional pack years: 0.00   Total pack years: 28.00   Types: Cigarettes   Quit date: 06/03/2014   Years since quitting: 8.3  Smokeless Tobacco Never   Counseling given: Not Answered   Outpatient Medications Prior to Visit  Medication Sig Dispense Refill   Accu-Chek Softclix Lancets lancets Use as instructed 100 each 12   acetaminophen (TYLENOL) 500 MG tablet Take 1,000 mg by mouth 2 (two) times daily.     albuterol (VENTOLIN HFA) 108 (90 Base) MCG/ACT inhaler Inhale into the lungs every 6 (six) hours as needed for wheezing or shortness of breath.     amiodarone (PACERONE) 200 MG tablet Take 1 tablet (200 mg total) by mouth daily. 90 tablet 3   aspirin 81 MG tablet Take 81 mg by mouth daily.     atorvastatin (LIPITOR) 10 MG tablet Take 1 tablet (10 mg total) by mouth daily. 90 tablet 1   benzonatate (TESSALON) 100 MG capsule Take 1 capsule (100 mg total) by mouth 2 (two) times daily as needed for cough. 20 capsule 0   Blood Glucose Monitoring Suppl (ACCU-CHEK GUIDE) w/Device KIT UAD 1 kit 0   cetirizine (EQ ALLERGY RELIEF, CETIRIZINE,) 10  MG tablet Take 1 tablet by mouth once daily 90 tablet 1   cholecalciferol (VITAMIN D3) 25 MCG (1000 UNIT) tablet Take 1,000 Units by mouth daily.     clobetasol cream (TEMOVATE) 0.05 % Apply 1 Application topically as needed.     clonazePAM (KLONOPIN) 0.5 MG tablet Take 1 tablet (0.5 mg total) by mouth daily AND 0.5 tablets (0.25 mg total) at bedtime. 45 tablet 1   Continuous Blood Gluc Sensor (FREESTYLE LIBRE 2 SENSOR) MISC Use to check blood sugar TID. Change sensor once every 14 days. E11.65 2 each 11   dapagliflozin propanediol (FARXIGA) 10 MG TABS tablet Take 1 tablet (10 mg total) by mouth daily before breakfast. 90  tablet 3   EPINEPHrine 0.3 mg/0.3 mL IJ SOAJ injection Inject 0.3 mg into the muscle as needed. 1 each 1   famotidine (PEPCID) 20 MG tablet Take 1 tablet by mouth 1-2 times a day as needed. (Patient taking differently: Take 20 mg by mouth daily. Take 1 tablet by mouth 1-2 times a day as needed. Takes every day) 90 tablet 1   fluticasone (FLONASE) 50 MCG/ACT nasal spray Use 1-2 sprays in each nostril once a day as needed for nasal congestion. 16 g 5   furosemide (LASIX) 80 MG tablet Take 1 tablet (80 mg total) by mouth daily as needed for edema or fluid. 180 tablet 1   gabapentin (NEURONTIN) 300 MG capsule TAKE 1 CAPSULE BY MOUTH EVERY DAY AT BEDTIME 90 capsule 1   glucose blood (ACCU-CHEK GUIDE) test strip Use as instructed 100 each 12   glucose blood (FREESTYLE LITE) test strip Use as instructed.  Please make sure this is compatible with [facility] 2 reader. 100 each 12   insulin aspart (NOVOLOG FLEXPEN) 100 UNIT/ML FlexPen Inject 11 Units into the skin 2 (two) times daily before a meal. 15 mL 2   insulin glargine (LANTUS SOLOSTAR) 100 UNIT/ML Solostar Pen Inject 32 Units into the skin daily. 15 mL 1   Insulin Pen Needle (PEN NEEDLES) 31G X 8 MM MISC UAD 100 each 6   ipratropium-albuterol (DUONEB) 0.5-2.5 (3) MG/3ML SOLN Take 3 mLs by nebulization every 6 (six) hours as needed. 360 mL 2   Lancet Devices (ACCU-CHEK SOFTCLIX) lancets 1 each by Other route 3 (three) times daily. 1 each 0   methocarbamol (ROBAXIN) 500 MG tablet TAKE 1 TABLET BY MOUTH ONCE DAILY AS NEEDED FOR MUSCLE SPASM 30 tablet 2   metoprolol succinate (TOPROL-XL) 50 MG 24 hr tablet Take 1 tablet (50 mg total) by mouth daily. 90 tablet 3   sacubitril-valsartan (ENTRESTO) 24-26 MG Take 2 tablets by mouth 2 (two) times daily. Disregard previous refill requests. This is correct sig - 2 tablets twice daily 60 tablet 6   sildenafil (VIAGRA) 100 MG tablet Take 1-2 tabs PO 1/2-1 hr prior to intercourse. 20 tablet 4   sodium chloride (OCEAN)  0.65 % SOLN nasal spray Place 1 spray into both nostrils as needed for congestion. 104 mL 0   spironolactone (ALDACTONE) 25 MG tablet Take 0.5 tablets (12.5 mg total) by mouth daily. 30 tablet 5   tamsulosin (FLOMAX) 0.4 MG CAPS capsule Take 2 capsules (0.8 mg total) by mouth daily. 180 capsule 1   triamcinolone cream (KENALOG) 0.1 % Apply sparingly to red itchy areas twice daily as needed. Avoid face, neck, armpits or groin area. Do not use more than 3 weeks in a row. 45 g 5   No facility-administered medications prior to visit.  Review of Systems  Review of Systems   Physical Exam  There were no vitals taken for this visit. Physical Exam   Lab Results:  CBC    Component Value Date/Time   WBC 8.3 06/14/2022 1820   RBC 4.55 06/14/2022 1820   HGB 13.6 06/14/2022 1820   HGB 14.0 08/26/2021 1608   HCT 41.0 06/14/2022 1820   HCT 40.9 08/26/2021 1608   PLT 335 06/14/2022 1820   PLT 286 08/26/2021 1608   MCV 90.1 06/14/2022 1820   MCV 88 08/26/2021 1608   MCH 29.9 06/14/2022 1820   MCHC 33.2 06/14/2022 1820   RDW 14.3 06/14/2022 1820   RDW 13.0 08/26/2021 1608   LYMPHSABS 2.6 06/14/2022 1820   LYMPHSABS 1.7 06/28/2018 1440   MONOABS 0.5 06/14/2022 1820   EOSABS 0.1 06/14/2022 1820   EOSABS 0.3 06/28/2018 1440   BASOSABS 0.1 06/14/2022 1820   BASOSABS 0.0 06/28/2018 1440    BMET    Component Value Date/Time   NA 141 08/05/2022 1524   NA 140 05/23/2022 1200   K 4.4 08/05/2022 1524   CL 113 (H) 08/05/2022 1524   CO2 17 (L) 08/05/2022 1524   GLUCOSE 138 (H) 08/05/2022 1524   BUN 22 (H) 08/05/2022 1524   BUN 19 05/23/2022 1200   CREATININE 2.14 (H) 08/05/2022 1524   CREATININE 1.68 (H) 04/24/2016 1502   CALCIUM 8.6 (L) 08/05/2022 1524   GFRNONAA 35 (L) 08/05/2022 1524   GFRNONAA >89 12/18/2014 1455   GFRAA 53 (L) 07/17/2020 1705   GFRAA >89 12/18/2014 1455    BNP    Component Value Date/Time   BNP 30.5 07/15/2022 1615   BNP 8.3 04/24/2016 1502     ProBNP    Component Value Date/Time   PROBNP 31.0 07/23/2022 1655    Imaging: No results found.   Assessment & Plan:   No problem-specific Assessment & Plan notes found for this encounter.     Glenford Bayley, NP 10/07/2022

## 2022-10-07 NOTE — Progress Notes (Signed)
ANNUAL DIABETIC FOOT EXAM  Subjective: LUCCAS [name] presents today for annual diabetic foot examination.  Chief Complaint  Patient presents with   Nail Problem    [facility] BS-[id number] A1C-7.9 PCP-Deborah Johnson PCP VST-"Couple months ago   Patient confirms h/o diabetes.  Patient relates 15 year h/o diabetes.  Patient denies any h/o foot wounds.  Patient has been diagnosed with neuropathy.  Risk factors: diabetes, diabetic neuropathy, HTN, CHF, CKD, hyperlipidemia, h/o tobacco use in remission.  Marcine Matar, MD is patient's PCP.  Past Medical History:  Diagnosis Date   AICD (automatic cardioverter/defibrillator) present    Chronic bronchitis (HCC)    "get it most q yr" (12/23/2013)   Chronic systolic (congestive) heart failure (HCC)    Fracture of left humerus    a. 07/2013.   GERD (gastroesophageal reflux disease)    not at present time   Heart murmur    "born w/it"    History of renal calculi    Hypertension    Kidney stones    NICM (nonischemic cardiomyopathy) (HCC)    a. 07/2013 Echo: EF 20-25%, diff HK, Gr2 DD, mild MR, mod dil LA/RA. 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    Shockable heart rhythm detected by automated external defibrillator    Syncope    Type II diabetes mellitus (HCC) 2006   Patient Active Problem List   Diagnosis Date Noted   Hyperlipidemia associated with type 2 diabetes mellitus (HCC) 09/18/2022   PTSD (post-traumatic stress disorder) 07/10/2022   Panic attack 06/17/2022   Passive suicidal ideations 06/17/2022   History of ventricular tachycardia 06/14/2022   Essential hypertension 06/14/2022   Stage 3b chronic kidney disease (HCC) 05/15/2022   Acute gout due to renal impairment involving left ankle 05/07/2022   Acute gout due to renal impairment involving right knee 05/07/2022   Hyponatremia 05/07/2022   Diarrhea 05/06/2022   Hyperglycemia 05/06/2022    Cellulitis of buttock 05/06/2022   Chronic HFrEF (heart failure with reduced ejection fraction) (HCC) 05/06/2022   Acute renal failure superimposed on stage 3b chronic kidney disease (HCC) 05/06/2022   Hypokalemia 05/05/2022   Rectal abscess 05/05/2022   Left buttock abscess 05/05/2022   Cardiac arrest (HCC) 05/03/2022   AKI (acute kidney injury) (HCC) 05/03/2022   V-tach (HCC) 05/03/2022   CKD (chronic kidney disease) stage 2, GFR 60-89 ml/min 10/24/2019   Acute non-recurrent frontal sinusitis 06/27/2019   COVID-19 virus infection 06/27/2019   Glaucoma suspect 12/22/2018   Seasonal and perennial allergic rhinoconjunctivitis 10/25/2018   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   Urticaria 05/03/2018   S/P inguinal hernia repair 03/01/2018   Unilateral inguinal hernia without obstruction or gangrene 07/24/2017   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   Hypersomnia 02/28/2016   Abnormal PFT 02/24/2016   Dyspnea 01/28/2016   Diabetic polyneuropathy associated with type 2 diabetes mellitus (HCC) 12/18/2015   Lumbar back pain 12/18/2015   Poorly controlled type 2 diabetes mellitus with circulatory disorder (HCC) 09/17/2015   Left median nerve neuropathy 04/16/2015   Lesion of right median nerve at forearm 04/16/2015   ICD (implantable cardioverter-defibrillator) in place 03/28/2014   Chronic systolic congestive heart failure (HCC) 03/28/2014   Nonischemic cardiomyopathy (HCC) 11/09/2013   Syncope 10/20/2013   Chest pain 07/28/2013   RENAL CALCULUS 05/13/2009   Obesity 05/08/2009   Former heavy cigarette smoker (20-39 per day) 05/08/2009   HOARSENESS 05/08/2009  GAD (generalized anxiety disorder) 02/28/2008   ASTHMATIC BRONCHITIS, ACUTE 02/28/2008   GERD 02/28/2008   IRRITABLE BOWEL SYNDROME 02/28/2008   Past Surgical History:  Procedure Laterality Date   CARDIAC CATHETERIZATION  08/10/13   CHOLECYSTECTOMY  ~ 2010   IMPLANTABLE CARDIOVERTER  DEFIBRILLATOR IMPLANT  12/23/2013   STJ Fortify ICD implanted by Dr Johney Frame for cardiomyopathy and syncope   IMPLANTABLE CARDIOVERTER DEFIBRILLATOR IMPLANT N/A 12/23/2013   Procedure: IMPLANTABLE CARDIOVERTER DEFIBRILLATOR IMPLANT;  Surgeon: Gardiner Rhyme, MD;  Location[facility] CATH LAB;  Service: Cardiovascular;  Laterality: N/A;   INGUINAL HERNIA REPAIR Left 03/01/2018   Procedure: OPEN REPAIR OF LEFT INGUINAL HERNIA WITH MESH;  Surgeon: Gaynelle Adu, MD;  Location: [facility] ORS;  Service: General;  Laterality: Left;   LEAD REVISION/REPAIR N/A 05/15/2022   Procedure: LEAD REVISION/REPAIR;  Surgeon: Lanier Prude, MD;  Location: [facility] INVASIVE CV LAB;  Service: Cardiovascular;  Laterality: N/A;   LEFT HEART CATHETERIZATION WITH CORONARY ANGIOGRAM N/A 08/10/2013   Procedure: LEFT HEART CATHETERIZATION WITH CORONARY ANGIOGRAM;  Surgeon: Corky Crafts, MD;  Location[facility] CATH LAB;  Service: Cardiovascular;  Laterality: N/A;   ORIF HUMERUS FRACTURE Left 08/12/2013   Procedure: OPEN REDUCTION INTERNAL FIXATION (ORIF) HUMERAL SHAFT FRACTURE;  Surgeon: Nadara Mustard, MD;  Location: [facility] OR;  Service: Orthopedics;  Laterality: Left;  Open Reduction Internal Fixation Left Humerus   RIGHT/LEFT HEART CATH AND CORONARY ANGIOGRAPHY N/A 05/12/2022   Procedure: RIGHT/LEFT HEART CATH AND CORONARY ANGIOGRAPHY;  Surgeon: Dorthula Nettles, DO;  Location: [facility] INVASIVE CV LAB;  Service: Cardiovascular;  Laterality: N/A;   URETEROSCOPY     "laser for kidney stones"   Current Outpatient Medications on File Prior to Visit  Medication Sig Dispense Refill   Accu-Chek Softclix Lancets lancets Use as instructed 100 each 12   acetaminophen (TYLENOL) 500 MG tablet Take 1,000 mg by mouth 2 (two) times daily.     albuterol (VENTOLIN HFA) 108 (90 Base) MCG/ACT inhaler Inhale into the lungs every 6 (six) hours as needed for wheezing or shortness of breath.     amiodarone (PACERONE) 200 MG tablet Take 1 tablet (200 mg total) by mouth daily.  90 tablet 3   aspirin 81 MG tablet Take 81 mg by mouth daily.     atorvastatin (LIPITOR) 10 MG tablet Take 1 tablet (10 mg total) by mouth daily. 90 tablet 1   benzonatate (TESSALON) 100 MG capsule Take 1 capsule (100 mg total) by mouth 2 (two) times daily as needed for cough. 20 capsule 0   Blood Glucose Monitoring Suppl (ACCU-CHEK GUIDE) w/Device KIT UAD 1 kit 0   cetirizine (EQ ALLERGY RELIEF, CETIRIZINE,) 10 MG tablet Take 1 tablet by mouth once daily 90 tablet 1   cholecalciferol (VITAMIN D3) 25 MCG (1000 UNIT) tablet Take 1,000 Units by mouth daily.     clobetasol cream (TEMOVATE) 0.05 % Apply 1 Application topically as needed.     clonazePAM (KLONOPIN) 0.5 MG tablet Take 1 tablet (0.5 mg total) by mouth daily AND 0.5 tablets (0.25 mg total) at bedtime. 45 tablet 1   Continuous Blood Gluc Sensor (FREESTYLE LIBRE 2 SENSOR) MISC Use to check blood sugar TID. Change sensor once every 14 days. E11.65 2 each 11   dapagliflozin propanediol (FARXIGA) 10 MG TABS tablet Take 1 tablet (10 mg total) by mouth daily before breakfast. 90 tablet 3   EPINEPHrine 0.3 mg/0.3 mL IJ SOAJ injection Inject 0.3 mg into the muscle as needed. 1 each 1  famotidine (PEPCID) 20 MG tablet Take 1 tablet by mouth 1-2 times a day as needed. (Patient taking differently: Take 20 mg by mouth daily. Take 1 tablet by mouth 1-2 times a day as needed. Takes every day) 90 tablet 1   fluticasone (FLONASE) 50 MCG/ACT nasal spray Use 1-2 sprays in each nostril once a day as needed for nasal congestion. 16 g 5   furosemide (LASIX) 80 MG tablet Take 1 tablet (80 mg total) by mouth daily as needed for edema or fluid. 180 tablet 1   gabapentin (NEURONTIN) 300 MG capsule TAKE 1 CAPSULE BY MOUTH EVERY DAY AT BEDTIME 90 capsule 1   glucose blood (ACCU-CHEK GUIDE) test strip Use as instructed 100 each 12   glucose blood (FREESTYLE LITE) test strip Use as instructed.  Please make sure this is compatible with [facility] 2 reader. 100 each 12    insulin aspart (NOVOLOG FLEXPEN) 100 UNIT/ML FlexPen Inject 11 Units into the skin 2 (two) times daily before a meal. 15 mL 2   insulin glargine (LANTUS SOLOSTAR) 100 UNIT/ML Solostar Pen Inject 32 Units into the skin daily. 15 mL 1   Insulin Pen Needle (PEN NEEDLES) 31G X 8 MM MISC UAD 100 each 6   ipratropium-albuterol (DUONEB) 0.5-2.5 (3) MG/3ML SOLN Take 3 mLs by nebulization every 6 (six) hours as needed. 360 mL 2   Lancet Devices (ACCU-CHEK SOFTCLIX) lancets 1 each by Other route 3 (three) times daily. 1 each 0   methocarbamol (ROBAXIN) 500 MG tablet TAKE 1 TABLET BY MOUTH ONCE DAILY AS NEEDED FOR MUSCLE SPASM 30 tablet 2   metoprolol succinate (TOPROL-XL) 50 MG 24 hr tablet Take 1 tablet (50 mg total) by mouth daily. 90 tablet 3   sacubitril-valsartan (ENTRESTO) 24-26 MG Take 2 tablets by mouth 2 (two) times daily. Disregard previous refill requests. This is correct sig - 2 tablets twice daily 60 tablet 6   sildenafil (VIAGRA) 100 MG tablet Take 1-2 tabs PO 1/2-1 hr prior to intercourse. 20 tablet 4   sodium chloride (OCEAN) 0.65 % SOLN nasal spray Place 1 spray into both nostrils as needed for congestion. 104 mL 0   spironolactone (ALDACTONE) 25 MG tablet Take 0.5 tablets (12.5 mg total) by mouth daily. 30 tablet 5   tamsulosin (FLOMAX) 0.4 MG CAPS capsule Take 2 capsules (0.8 mg total) by mouth daily. 180 capsule 1   triamcinolone cream (KENALOG) 0.1 % Apply sparingly to red itchy areas twice daily as needed. Avoid face, neck, armpits or groin area. Do not use more than 3 weeks in a row. 45 g 5   No current facility-administered medications on file prior to visit.    Allergies  Allergen Reactions   Bee Venom Anaphylaxis   Trulicity [Dulaglutide] Diarrhea    Extra dose of Trulicity caused cardiac arrest. Patient experience onset of diarrhea with using this medication - cleared after med stopped.   Social History   Occupational History   Occupation: Disabled  Tobacco Use   Smoking  status: Former    Packs/day: 1.00    Years: 28.00    Additional pack years: 0.00    Total pack years: 28.00    Types: Cigarettes    Quit date: 06/03/2014    Years since quitting: 8.3   Smokeless tobacco: Never  Vaping Use   Vaping Use: Never used  Substance and Sexual Activity   Alcohol use: Yes   Drug use: Yes    Types: Marijuana    Comment: occasionally  Sexual activity: Yes   Family History  Problem Relation Age of Onset   Diabetes Father    Arthritis Father    Hypertension Father    Diabetes Mother    Arthritis Mother    Hypertension Mother    Hypertension Brother    Immunization History  Administered Date(s) Administered   PFIZER(Purple Top)SARS-COV-2 Vaccination 10/21/2019     Review of Systems: Negative except as noted in the HPI.   Objective: Vitals:   10/06/22 1557  BP: 107/67    [name] is a pleasant 57 y.o. male in NAD. AAO X 3.  Vascular Examination: CFT immediate b/l LE. Palpable DP/PT pulses b/l LE. Digital hair present b/l. Skin temperature gradient WNL b/l. No pain with calf compression b/l. No edema noted b/l. No cyanosis or clubbing noted b/l LE.  Dermatological Examination: Pedal integument with normal turgor, texture and tone b/l LE. No open wounds b/l. No interdigital macerations b/l. Toenails 1-5 b/l elongated, thickened, discolored with subungual debris. +Tenderness with dorsal palpation of nailplates. No hyperkeratotic or porokeratotic lesions present.  Neurological Examination: Pt has subjective symptoms of neuropathy. Protective sensation intact 5/5 intact bilaterally with 10g monofilament b/l. Vibratory sensation intact b/l. Proprioception intact bilaterally.  Musculoskeletal Examination: Normal muscle strength 5/5 to all lower extremity muscle groups bilaterally. Pes planus deformity noted bilateral LE.Marland Kitchen No pain, crepitus or joint limitation noted with ROM b/l LE.  Patient ambulates independently without assistive aids.  Lab  Results  Component Value Date   HGBA1C 7.9 (A) 09/18/2022   ADA Risk Categorization: Low Risk :  Patient has all of the following: Intact protective sensation No prior foot ulcer  No severe deformity Pedal pulses present  Assessment: 1. Pain due to onychomycosis of toenails of both feet   2. Pes planus of both feet   3. Diabetic polyneuropathy associated with type 2 diabetes mellitus (HCC)   4. Encounter for diabetic foot exam [facility])    Plan: -Patient was evaluated and treated. All patient's and/or POA's questions/concerns answered on today's visit. -Diabetic foot examination performed today. -Continue diabetic foot care principles: inspect feet daily, monitor glucose as recommended by PCP and/or Endocrinologist, and follow prescribed diet per PCP, Endocrinologist and/or dietician. -Patient to continue soft, supportive shoe gear daily. -Toenails 1-5 b/l were debrided in length and girth with sterile nail nippers and dremel without iatrogenic bleeding.  -Patient/POA to call should there be question/concern in the interim. Return in about 3 months (around 01/05/2023).  Freddie Breech, DPM

## 2022-10-08 ENCOUNTER — Encounter (HOSPITAL_COMMUNITY)
Admission: RE | Admit: 2022-10-08 | Discharge: 2022-10-08 | Disposition: A | Discharge status: Home / Self Care | Payer: Medicaid Other | Source: Ambulatory Visit | Attending: Cardiology | Admitting: Cardiology

## 2022-10-08 DIAGNOSIS — I5022 Chronic systolic (congestive) heart failure: Secondary | ICD-10-CM | POA: Diagnosis present

## 2022-10-08 DIAGNOSIS — Z95810 Presence of automatic (implantable) cardiac defibrillator: Secondary | ICD-10-CM | POA: Insufficient documentation

## 2022-10-09 ENCOUNTER — Ambulatory Visit (INDEPENDENT_AMBULATORY_CARE_PROVIDER_SITE_OTHER): Payer: Medicaid Other | Admitting: Internal Medicine

## 2022-10-09 ENCOUNTER — Encounter: Payer: Self-pay | Admitting: Primary Care

## 2022-10-09 ENCOUNTER — Ambulatory Visit (INDEPENDENT_AMBULATORY_CARE_PROVIDER_SITE_OTHER): Payer: Medicaid Other | Admitting: Primary Care

## 2022-10-09 VITALS — BP 110/70 | HR 64 | Temp 98.0°F | Ht 73.0 in | Wt 277.8 lb

## 2022-10-09 DIAGNOSIS — J4489 Other specified chronic obstructive pulmonary disease: Secondary | ICD-10-CM | POA: Insufficient documentation

## 2022-10-09 DIAGNOSIS — R0600 Dyspnea, unspecified: Secondary | ICD-10-CM | POA: Diagnosis not present

## 2022-10-09 LAB — PULMONARY FUNCTION TEST
DL/VA % pred: 123 %
DL/VA: 5.21 ml/min/mmHg/L
DLCO cor % pred: 77 %
DLCO cor: 23.92 ml/min/mmHg
DLCO unc % pred: 77 %
DLCO unc: 23.92 ml/min/mmHg
FEF 25-75 Post: 2.1 L/sec
FEF 25-75 Pre: 1.35 L/sec
FEF2575-%Change-Post: 55 %
FEF2575-%Pred-Post: 61 %
FEF2575-%Pred-Pre: 39 %
FEV1-%Change-Post: 37 %
FEV1-%Pred-Post: 58 %
FEV1-%Pred-Pre: 42 %
FEV1-Post: 2.39 L
FEV1-Pre: 1.74 L
FEV1FVC-%Change-Post: 32 %
FEV1FVC-%Pred-Pre: 72 %
FEV6-%Change-Post: -4 %
FEV6-%Pred-Post: 57 %
FEV6-%Pred-Pre: 59 %
FEV6-Post: 2.95 L
FEV6-Pre: 3.09 L
FEV6FVC-%Change-Post: 1 %
FEV6FVC-%Pred-Post: 104 %
FEV6FVC-%Pred-Pre: 102 %
FVC-%Change-Post: 3 %
FVC-%Pred-Post: 60 %
FVC-%Pred-Pre: 58 %
FVC-Post: 3.25 L
FVC-Pre: 3.13 L
Post FEV1/FVC ratio: 74 %
Post FEV6/FVC ratio: 100 %
Pre FEV1/FVC ratio: 56 %
Pre FEV6/FVC Ratio: 99 %
RV % pred: 109 %
RV: 2.57 L
TLC % pred: 74 %
TLC: 5.63 L

## 2022-10-09 MED ORDER — TRELEGY ELLIPTA 100-62.5-25 MCG/ACT IN AEPB: 1.0000 | INHALATION_SPRAY | Freq: Every day | RESPIRATORY_TRACT | 0 refills | Status: DC

## 2022-10-09 NOTE — Progress Notes (Signed)
Full PFT performed today. °

## 2022-10-09 NOTE — Assessment & Plan Note (Addendum)
-   Patient was seen in March for acute exacerbation.  Treated with doxycycline.  Cough and congestion improved.  He continues to have intermittent dyspnea symptoms and chest tightness.  Pulmonary function testing today showed moderate obstructive airway disease with reversibility/ FEV1 58%.  We will trial patient on Trelegy 100 mcg 1 puff daily.  Follow-up in 4 weeks or sooner if needed.

## 2022-10-09 NOTE — Patient Instructions (Signed)
Full PFT performed today. °

## 2022-10-09 NOTE — Patient Instructions (Addendum)
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   Recommendations: Take Trelegy 1 puff daily in the morning   Follow-up: 3-4 weeks with Beth NP   Fluticasone; Umeclidinium; Vilanterol Dry Powder Inhaler (DPI) What is this medication? FLUTICASONE; UMECLIDINIUM; VILANTEROL (floo TIK a sone; ue MEK li DIN ee um; vye LAN ter ol) treats asthma and chronic obstructive pulmonary disease (COPD). It works by opening the airways of the lungs, making it easier to breathe. It is a combination of an inhaled steroid, an anticholinergic, and a bronchodilator. It is often called a controller inhaler. Do not use it to treat a sudden asthma attack or COPD flare-up. This medicine may be used for other purposes; ask your health care provider or pharmacist if you have questions. COMMON BRAND NAME(S): TRELEGY ELLIPTA What should I tell my care team before I take this medication? They need to know if you have any of these conditions: Diabetes Eye disease Heart disease High blood pressure Immune system problems Infection Irregular heartbeat or rhythm Kidney disease Osteoporosis, weak bones Pheochromocytoma Prostate disease Seizures Thyroid disease Vision problems An unusual or allergic reaction to fluticasone, umeclidinium, vilanterol, lactose, milk proteins, other medications, foods, dyes, or preservatives Pregnant or trying to get pregnant Breast-feeding How should I use this medication? This medication is inhaled through the mouth. Rinse your mouth with water after use. Make sure not to swallow the water. Take it as directed on the prescription label at the same time every day. Do not use it more often than directed. A special MedGuide will be given to you by the pharmacist with each prescription and refill. Be sure to read this information carefully each time. Talk to your care team about the use of this medication in  children. Special care may be needed. Overdosage: If you think you have taken too much of this medicine contact a poison control center or emergency room at once. NOTE: This medicine is only for you. Do not share this medicine with others. What if I miss a dose? If you miss a dose, take it as soon as you can. If it is almost time for your next dose, take only that dose. Do not take double or extra doses. What may interact with this medication? Do not take this medication with any of the following: Cisapride Dofetilide Dronedarone MAOIs like Carbex, Eldepryl, Marplan, Nardil, and Parnate Pimozide Thioridazine Ziprasidone This medication may also interact with the following: Aclidinium Antihistamines for allergy Antiviral medications for HIV or AIDS Atropine Beta-blockers like metoprolol and propranolol Certain antibiotics like clarithromycin and telithromycin Certain medications for bladder problems like oxybutynin, tolterodine Certain medications for depression, anxiety, or psychotic disturbances Certain medications for fungal infections like ketoconazole, itraconazole, posaconazole, voriconazole Certain medications for Parkinson's disease like benztropine, trihexyphenidyl Certain medications for stomach problems like dicyclomine, hyoscyamine Certain medications for travel sickness like scopolamine Conivaptan Diuretics Ipratropium Medications for colds Other medications for breathing problems Other medications that prolong the QT interval (cause an abnormal heart rhythm) Nefazodone Tiotropium This list may not describe all possible interactions. Give your health care provider a list of all the medicines, herbs, non-prescription drugs, or dietary supplements you use. Also tell them if you smoke, drink alcohol, or use illegal drugs. Some items may interact with your medicine. What should I watch for while using this medication? Visit your care team for regular checks on your  progress. Tell your care team if your symptoms do not  start to get better or if they get worse. NEVER use this medication for an acute asthma attack. You should use your short-acting rescue inhaler for an acute attack. If your symptoms get worse or if you need your short-acting inhalers more often, call your care team right away. This medication can worsen breathing or cause wheezing right after you use it. Be sure you have a short-acting inhaler for acute attacks (wheezing) nearby. If this happens, stop using this medication right away and call your care team. This medication may increase the risk of serious asthma-related problems. Talk to your care team if you have questions. Do not treat yourself for coughs, colds or allergies without asking your care team for advice. Some nonprescription medications can affect this one. You and your care team should develop an Asthma Action Plan that is just for you. Be sure to know what to do if you are in the yellow (asthma is getting worse) or red (medical alert) zones. If you are going to need surgery or other procedure, tell your care team that you are using this medication. This medication may increase your risk of getting an infection. Call your care team for advice if you get a fever, chills, sore throat, or other symptoms of a cold or flu. Do not treat yourself. Try to avoid being around people who are sick. This medication may increase blood sugar. The risk may be higher in patients who already have diabetes. Ask your care team if changes in diet or medications are needed. What side effects may I notice from receiving this medication? Side effects that you should report to your care team as soon as possible: Allergic reactions--skin rash, itching, hives, swelling of the face, lips, tongue, or throat Heart rhythm changes--fast or irregular heartbeat, dizziness, feeling faint or lightheaded, chest pain, trouble breathing Increase in blood pressure Low  adrenal gland function--nausea, vomiting, loss of appetite, unusual weakness or fatigue, dizziness Muscle pain or cramps Sudden eye pain or change in vision such as blurry vision, seeing halos around lights, vision loss Thrush--white patches in the mouth Trouble passing urine Wheezing or trouble breathing that is worse after use Side effects that usually do not require medical attention (report to your care team if they continue or are bothersome): Change in taste Cough Dry mouth Headache Hoarseness Sore throat Tremors or shaking Trouble sleeping This list may not describe all possible side effects. Call your doctor for medical advice about side effects. You may report side effects to FDA at 1-800-FDA-1088. Where should I keep my medication? Keep out of the reach of children and pets. Store at room temperature between 20 and 25 degrees C (68 and 77 degrees F). Keep inhaler away from extreme heat, cold or humidity. Throw away 6 weeks after removing it from the foil pouch, when the dose counter reads "0" or after the expiration date, whichever is first. NOTE: This sheet is a summary. It may not cover all possible information. If you have questions about this medicine, talk to your doctor, pharmacist, or health care provider.  2023 [employer]vier/Gold Standard (2021-03-14 00:00:00)

## 2022-10-10 ENCOUNTER — Encounter: Payer: Self-pay | Admitting: Internal Medicine

## 2022-10-10 ENCOUNTER — Encounter (HOSPITAL_COMMUNITY)
Admission: RE | Admit: 2022-10-10 | Discharge: 2022-10-10 | Disposition: A | Discharge status: Home / Self Care | Payer: Medicaid Other | Source: Ambulatory Visit | Attending: Cardiology | Admitting: Cardiology

## 2022-10-10 ENCOUNTER — Encounter: Payer: Self-pay | Admitting: Cardiology

## 2022-10-10 DIAGNOSIS — I5022 Chronic systolic (congestive) heart failure: Secondary | ICD-10-CM

## 2022-10-10 NOTE — Progress Notes (Signed)
QUALITY OF LIFE SCORE REVIEW  Pt completed Quality of Life survey as a participant in Cardiac Rehab.  Scores  or below are considered low.  Pt score very low in several areas Overall 17.08 , Health and Function 16.30, socioeconomic 18.5, physiological and spiritual 16.21, family 20. Patient quality of life slightly altered by physical constraints which limits ability to perform as prior to recent cardiac illness.  Offered emotional support and reassurance.  Will continue to monitor and intervene as necessary. Hosea says his anxiety is controlled on medications and is receiving counseling. Arpad says cardiac rehab has been helpful to him so far.Will continue to monitor the patient throughout  the program.Lisett Dirusso Mila Palmer RN BSN

## 2022-10-11 ENCOUNTER — Other Ambulatory Visit: Payer: Self-pay | Admitting: Internal Medicine

## 2022-10-11 DIAGNOSIS — E1142 Type 2 diabetes mellitus with diabetic polyneuropathy: Secondary | ICD-10-CM

## 2022-10-13 ENCOUNTER — Telehealth (HOSPITAL_COMMUNITY): Payer: Self-pay

## 2022-10-13 ENCOUNTER — Encounter: Payer: Self-pay | Admitting: Internal Medicine

## 2022-10-13 ENCOUNTER — Ambulatory Visit: Payer: Medicaid Other | Attending: Internal Medicine | Admitting: Internal Medicine

## 2022-10-13 ENCOUNTER — Encounter (HOSPITAL_COMMUNITY)
Admission: RE | Admit: 2022-10-13 | Discharge: 2022-10-13 | Disposition: A | Discharge status: Home / Self Care | Payer: Medicaid Other | Source: Ambulatory Visit | Attending: Cardiology | Admitting: Cardiology

## 2022-10-13 VITALS — BP 104/69 | HR 64 | Temp 98.0°F | Ht 73.0 in | Wt 283.0 lb

## 2022-10-13 DIAGNOSIS — Z6837 Body mass index (BMI) 37.0-37.9, adult: Secondary | ICD-10-CM | POA: Diagnosis not present

## 2022-10-13 DIAGNOSIS — M545 Low back pain, unspecified: Secondary | ICD-10-CM

## 2022-10-13 DIAGNOSIS — E1169 Type 2 diabetes mellitus with other specified complication: Secondary | ICD-10-CM

## 2022-10-13 DIAGNOSIS — I428 Other cardiomyopathies: Secondary | ICD-10-CM | POA: Diagnosis not present

## 2022-10-13 DIAGNOSIS — J4489 Other specified chronic obstructive pulmonary disease: Secondary | ICD-10-CM | POA: Diagnosis not present

## 2022-10-13 DIAGNOSIS — E669 Obesity, unspecified: Secondary | ICD-10-CM | POA: Diagnosis not present

## 2022-10-13 DIAGNOSIS — I5022 Chronic systolic (congestive) heart failure: Secondary | ICD-10-CM

## 2022-10-13 DIAGNOSIS — Z794 Long term (current) use of insulin: Secondary | ICD-10-CM | POA: Diagnosis not present

## 2022-10-13 DIAGNOSIS — N1832 Chronic kidney disease, stage 3b: Secondary | ICD-10-CM

## 2022-10-13 DIAGNOSIS — Z7984 Long term (current) use of oral hypoglycemic drugs: Secondary | ICD-10-CM

## 2022-10-13 DIAGNOSIS — G8929 Other chronic pain: Secondary | ICD-10-CM

## 2022-10-13 MED ORDER — ALBUTEROL SULFATE HFA 108 (90 BASE) MCG/ACT IN AERS
2.0000 | INHALATION_SPRAY | Freq: Four times a day (QID) | RESPIRATORY_TRACT | 6 refills | Status: DC | PRN
Start: 2022-10-13 — End: 2022-10-14
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
## 2022-10-13 NOTE — Telephone Encounter (Signed)
[name], [name] is a participant in cardiac rehab and he informed me that his albuterol inhaler is empty. Can he get a refill so he can bring it to rehab with him?

## 2022-10-14 ENCOUNTER — Other Ambulatory Visit: Payer: Self-pay | Admitting: Primary Care

## 2022-10-14 DIAGNOSIS — J4489 Other specified chronic obstructive pulmonary disease: Secondary | ICD-10-CM

## 2022-10-14 LAB — MICROALBUMIN / CREATININE URINE RATIO
Creatinine, Urine: 54.2 mg/dL
Microalb/Creat Ratio: <6 mg/g creat (ref 0–29)
Microalbumin, Urine: <3 ug/mL

## 2022-10-14 MED ORDER — ALBUTEROL SULFATE HFA 108 (90 BASE) MCG/ACT IN AERS
2.0000 | INHALATION_SPRAY | Freq: Four times a day (QID) | RESPIRATORY_TRACT | 2 refills | Status: DC | PRN
Start: 2022-10-14 — End: 2022-10-24

## 2022-10-14 NOTE — Progress Notes (Signed)
Cardiac Individual Treatment Plan  Patient Details  Name: [name] MRN: [id number] Date of Birth: 1965-09-25 Referring Provider:   Flowsheet Row CARDIAC REHAB PHASE II ORIENTATION from 09/25/2022 in [facility]  Referring Provider Olga Millers, MD       Initial Encounter Date:  Flowsheet Row CARDIAC REHAB PHASE II ORIENTATION from 09/25/2022 in [facility]  Date 09/25/22       Visit Diagnosis: No diagnosis found.  Patient's Home Medications on Admission:  Current Outpatient Medications:    Accu-Chek Softclix Lancets lancets, Use as instructed, Disp: 100 each, Rfl: 12   acetaminophen (TYLENOL) 500 MG tablet, Take 1,000 mg by mouth 2 (two) times daily., Disp: , Rfl:    albuterol (VENTOLIN HFA) 108 (90 Base) MCG/ACT inhaler, Inhale 2 puffs into the lungs every 6 (six) hours as needed for wheezing or shortness of breath., Disp: 18 g, Rfl: 2   amiodarone (PACERONE) 200 MG tablet, Take 1 tablet (200 mg total) by mouth daily., Disp: 90 tablet, Rfl: 3   aspirin 81 MG tablet, Take 81 mg by mouth daily., Disp: , Rfl:    atorvastatin (LIPITOR) 10 MG tablet, Take 1 tablet (10 mg total) by mouth daily., Disp: 90 tablet, Rfl: 1   benzonatate (TESSALON) 100 MG capsule, Take 1 capsule (100 mg total) by mouth 2 (two) times daily as needed for cough., Disp: 20 capsule, Rfl: 0   Blood Glucose Monitoring Suppl (ACCU-CHEK GUIDE) w/Device KIT, UAD, Disp: 1 kit, Rfl: 0   cetirizine (EQ ALLERGY RELIEF, CETIRIZINE,) 10 MG tablet, Take 1 tablet by mouth once daily, Disp: 90 tablet, Rfl: 1   cholecalciferol (VITAMIN D3) 25 MCG (1000 UNIT) tablet, Take 1,000 Units by mouth daily., Disp: , Rfl:    clobetasol cream (TEMOVATE) 0.05 %, Apply 1 Application topically as needed., Disp: , Rfl:    clonazePAM (KLONOPIN) 0.5 MG tablet, Take 1 tablet (0.5 mg total) by mouth daily AND 0.5 tablets  (0.25 mg total) at bedtime., Disp: 45 tablet, Rfl: 1   Continuous Blood Gluc Sensor (FREESTYLE LIBRE 2 SENSOR) MISC, Use to check blood sugar TID. Change sensor once every 14 days. E11.65, Disp: 2 each, Rfl: 11   dapagliflozin propanediol (FARXIGA) 10 MG TABS tablet, Take 1 tablet (10 mg total) by mouth daily before breakfast., Disp: 90 tablet, Rfl: 3   EPINEPHrine 0.3 mg/0.3 mL IJ SOAJ injection, Inject 0.3 mg into the muscle as needed., Disp: 1 each, Rfl: 1   famotidine (PEPCID) 20 MG tablet, Take 1 tablet by mouth 1-2 times a day as needed. (Patient taking differently: Take 20 mg by mouth daily. Take 1 tablet by mouth 1-2 times a day as needed. Takes every day), Disp: 90 tablet, Rfl: 1   fluticasone (FLONASE) 50 MCG/ACT nasal spray, Use 1-2 sprays in each nostril once a day as needed for nasal congestion., Disp: 16 g, Rfl: 5   Fluticasone-Umeclidin-Vilant (TRELEGY ELLIPTA) 100-62.5-25 MCG/ACT AEPB, Inhale 1 each into the lungs daily., Disp: 1 each, Rfl: 0   furosemide (LASIX) 80 MG tablet, Take 1 tablet (80 mg total) by mouth daily as needed for edema or fluid., Disp: 180 tablet, Rfl: 1   gabapentin (NEURONTIN) 300 MG capsule, TAKE 1 CAPSULE BY MOUTH EVERY DAY AT BEDTIME, Disp: 90 capsule, Rfl: 1   glucose blood (ACCU-CHEK GUIDE) test strip, Use as instructed, Disp: 100 each, Rfl: 12   glucose blood (  FREESTYLE LITE) test strip, Use as instructed.  Please make sure this is compatible with [employer] 2 reader., Disp: 100 each, Rfl: 12   insulin aspart (NOVOLOG FLEXPEN) 100 UNIT/ML FlexPen, Inject 11 Units into the skin 2 (two) times daily before a meal., Disp: 15 mL, Rfl: 2   insulin glargine (LANTUS SOLOSTAR) 100 UNIT/ML Solostar Pen, Inject 32 Units into the skin daily., Disp: 15 mL, Rfl: 1   Insulin Pen Needle (PEN NEEDLES) 31G X 8 MM MISC, UAD, Disp: 100 each, Rfl: 6   ipratropium-albuterol (DUONEB) 0.5-2.5 (3) MG/3ML SOLN, Take 3 mLs by nebulization every 6 (six) hours as needed., Disp: 360 mL, Rfl:  2   Lancet Devices (ACCU-CHEK SOFTCLIX) lancets, 1 each by Other route 3 (three) times daily., Disp: 1 each, Rfl: 0   methocarbamol (ROBAXIN) 500 MG tablet, TAKE 1 TABLET BY MOUTH ONCE DAILY AS NEEDED FOR MUSCLE SPASM, Disp: 30 tablet, Rfl: 2   metoprolol succinate (TOPROL-XL) 50 MG 24 hr tablet, Take 1 tablet (50 mg total) by mouth daily., Disp: 90 tablet, Rfl: 3   sacubitril-valsartan (ENTRESTO) 24-26 MG, Take 2 tablets by mouth 2 (two) times daily. Disregard previous refill requests. This is correct sig - 2 tablets twice daily, Disp: 60 tablet, Rfl: 6   sildenafil (VIAGRA) 100 MG tablet, Take 1-2 tabs PO 1/2-1 hr prior to intercourse., Disp: 20 tablet, Rfl: 4   sodium chloride (OCEAN) 0.65 % SOLN nasal spray, Place 1 spray into both nostrils as needed for congestion., Disp: 104 mL, Rfl: 0   spironolactone (ALDACTONE) 25 MG tablet, Take 0.5 tablets (12.5 mg total) by mouth daily., Disp: 30 tablet, Rfl: 5   tamsulosin (FLOMAX) 0.4 MG CAPS capsule, Take 2 capsules (0.8 mg total) by mouth daily., Disp: 180 capsule, Rfl: 1   triamcinolone cream (KENALOG) 0.1 %, Apply sparingly to red itchy areas twice daily as needed. Avoid face, neck, armpits or groin area. Do not use more than 3 weeks in a row., Disp: 45 g, Rfl: 5  Past Medical History: Past Medical History:  Diagnosis Date   AICD (automatic cardioverter/defibrillator) present    Chronic bronchitis (HCC)    "get it most q yr" (12/23/2013)   Chronic systolic (congestive) heart failure (HCC)    Fracture of left humerus    a. 07/2013.   GERD (gastroesophageal reflux disease)    not at present time   Heart murmur    "born w/it"    History of renal calculi    Hypertension    Kidney stones    NICM (nonischemic cardiomyopathy) (HCC)    a. 07/2013 Echo: EF 20-25%, diff HK, Gr2 DD, mild MR, mod dil LA/RA. 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    Shockable heart rhythm  detected by automated external defibrillator    Syncope    Type II diabetes mellitus (HCC) 2006    Tobacco Use: Social History   Tobacco Use  Smoking Status Former   Packs/day: 1.00   Years: 28.00   Additional pack years: 0.00   Total pack years: 28.00   Types: Cigarettes   Quit date: 06/03/2014   Years since quitting: 8.3  Smokeless Tobacco Never    Labs: Review Flowsheet  More data exists      Latest Ref Rng & Units 02/01/2022 05/03/2022 05/04/2022 05/12/2022 09/18/2022  Labs for ITP Cardiac and Pulmonary Rehab  Hemoglobin A1c 4.0 - 5.6 % - -  10.2  - 7.9   Bicarbonate 20.0 - 28.0 mmol/L 20.0 - 28.0 mmol/L 27.7  - - 20.7  19.9  -  TCO2 22 - 32 mmol/L 22 - 32 mmol/L 29  22  - 22  21  -  Acid-base deficit 0.0 - 2.0 mmol/L 0.0 - 2.0 mmol/L - - - 4.0  5.0  -  O2 Saturation % % 87  - - 63  63  -    Capillary Blood Glucose: Lab Results  Component Value Date   GLUCAP 209 (H) 10/03/2022   GLUCAP 217 (H) 10/03/2022   GLUCAP 148 (H) 06/16/2022   GLUCAP 174 (H) 06/16/2022   GLUCAP 184 (H) 06/15/2022     Exercise Target Goals: Exercise Program Goal: Individual exercise prescription set using results from initial 6 min walk test and THRR while considering  patient's activity barriers and safety.   Exercise Prescription Goal: Initial exercise prescription builds to 30-45 minutes a day of aerobic activity, 2-3 days per week.  Home exercise guidelines will be given to patient during program as part of exercise prescription that the participant will acknowledge.  Activity Barriers & Risk Stratification:  Activity Barriers & Cardiac Risk Stratification - 09/25/22 1609       Activity Barriers & Cardiac Risk Stratification   Activity Barriers Joint Problems;Back Problems;Shortness of Breath;Decreased Ventricular Function    Cardiac Risk Stratification High   <5METs on            6 Minute Walk:  6 Minute Walk     Row Name 09/25/22 1608         6 Minute Walk    Phase Initial     Distance 1109 feet     Walk Time 6 minutes     # of Rest Breaks 0     MPH 2.1     METS 2.4     RPE 12     Perceived Dyspnea  1     VO2 Peak 8.32     Symptoms Yes (comment)     Comments 4/10 chronic low back pain, SOB. SOB resolved with rest     Resting HR 62 bpm     Resting BP 114/76     Resting Oxygen Saturation  97 %     Exercise Oxygen Saturation  during 6 min walk 97 %     Max Ex. HR 80 bpm     Max Ex. BP 112/68     2 Minute Post BP 118/80              Oxygen Initial Assessment:   Oxygen Re-Evaluation:   Oxygen Discharge (Final Oxygen Re-Evaluation):   Initial Exercise Prescription:  Initial Exercise Prescription - 09/25/22 1600       Date of Initial Exercise RX and Referring Provider   Date 09/25/22    Referring Provider Olga Millers, MD    Expected Discharge Date 11/07/22      Treadmill   MPH 1.7    Grade 0    Minutes 15    METs 2      NuStep   Level 1    SPM 75    Minutes 15    METs 2      Prescription Details   Frequency (times per week) 3    Duration Progress to 30 minutes of continuous aerobic without signs/symptoms of physical distress      Intensity   THRR 40-80% of Max Heartrate 65-130    Ratings  of Perceived Exertion 11-13    Perceived Dyspnea 0-4      Progression   Progression Continue progressive overload as per policy without signs/symptoms or physical distress.      Resistance Training   Training Prescription Yes    Weight 2    Reps 10-15             Perform Capillary Blood Glucose checks as needed.  Exercise Prescription Changes:   Exercise Prescription Changes     Row Name 10/03/22 1503             Response to Exercise   Blood Pressure (Admit) 108/70       Blood Pressure (Exercise) 176/80       Blood Pressure (Exit) 115/76       Heart Rate (Admit) 75 bpm       Heart Rate (Exercise) 91 bpm       Heart Rate (Exit) 63 bpm       Rating of Perceived Exertion (Exercise) 11        Symptoms Pt lightheaded after using treadmill       Comments Pt's first day in the CRP2 program       Duration Continue with 30 min of aerobic exercise without signs/symptoms of physical distress.       Intensity THRR unchanged         Progression   Progression Continue to progress workloads to maintain intensity without signs/symptoms of physical distress.       Average METs 2.3         Resistance Training   Training Prescription No       Weight Weights held due to being lightheaded         Interval Training   Interval Training No         Treadmill   MPH 1.7       Grade 0       Minutes 15       METs 2.3         NuStep   Level --  Not done due to being lightheaded                Exercise Comments:   Exercise Comments     Row Name 10/03/22 1506           Exercise Comments Pt's first day in the CRP2 program. Pt became lightheaded after using the treamdill. Will remove pt from treadmill at this time and utilize a non-weight bearing modality.                Exercise Goals and Review:   Exercise Goals     Row Name 09/25/22 1352             Exercise Goals   Increase Physical Activity Yes       Intervention Provide advice, education, support and counseling about physical activity/exercise needs.;Develop an individualized exercise prescription for aerobic and resistive training based on initial evaluation findings, risk stratification, comorbidities and participant's personal goals.       Expected Outcomes Short Term: Attend rehab on a regular basis to increase amount of physical activity.;Long Term: Exercising regularly at least 3-5 days a week.;Long Term: Add in home exercise to make exercise part of routine and to increase amount of physical activity.       Increase Strength and Stamina Yes       Intervention Provide advice, education, support and counseling about physical activity/exercise needs.;Develop an individualized exercise  prescription for aerobic  and resistive training based on initial evaluation findings, risk stratification, comorbidities and participant's personal goals.       Expected Outcomes Short Term: Increase workloads from initial exercise prescription for resistance, speed, and METs.;Short Term: Perform resistance training exercises routinely during rehab and add in resistance training at home;Long Term: Improve cardiorespiratory fitness, muscular endurance and strength as measured by increased METs and functional capacity ( )       Able to understand and use rate of perceived exertion (RPE) scale Yes       Intervention Provide education and explanation on how to use RPE scale       Expected Outcomes Short Term: Able to use RPE daily in rehab to express subjective intensity level;Long Term:  Able to use RPE to guide intensity level when exercising independently       Knowledge and understanding of Target Heart Rate Range (THRR) Yes       Intervention Provide education and explanation of THRR including how the numbers were predicted and where they are located for reference       Expected Outcomes Short Term: Able to state/look up THRR;Short Term: Able to use daily as guideline for intensity in rehab;Long Term: Able to use THRR to govern intensity when exercising independently       Understanding of Exercise Prescription Yes       Intervention Provide education, explanation, and written materials on patient's individual exercise prescription       Expected Outcomes Short Term: Able to explain program exercise prescription;Long Term: Able to explain home exercise prescription to exercise independently                Exercise Goals Re-Evaluation :  Exercise Goals Re-Evaluation     Row Name 10/03/22 1505             Exercise Goal Re-Evaluation   Exercise Goals Review Increase Physical Activity;Increase Strength and Stamina;Able to understand and use rate of perceived exertion (RPE) scale;Knowledge and understanding of  Target Heart Rate Range (THRR);Understanding of Exercise Prescription       Comments Pt's first day in the CRP2 program. Pt understnads the exercise RX, RPE sclae and THRR,       Expected Outcomes Will continue to montior patient and progress exercise workloads as tolerated.                Discharge Exercise Prescription (Final Exercise Prescription Changes):  Exercise Prescription Changes - 10/03/22 1503       Response to Exercise   Blood Pressure (Admit) 108/70    Blood Pressure (Exercise) 176/80    Blood Pressure (Exit) 115/76    Heart Rate (Admit) 75 bpm    Heart Rate (Exercise) 91 bpm    Heart Rate (Exit) 63 bpm    Rating of Perceived Exertion (Exercise) 11    Symptoms Pt lightheaded after using treadmill    Comments Pt's first day in the CRP2 program    Duration Continue with 30 min of aerobic exercise without signs/symptoms of physical distress.    Intensity THRR unchanged      Progression   Progression Continue to progress workloads to maintain intensity without signs/symptoms of physical distress.    Average METs 2.3      Resistance Training   Training Prescription No    Weight Weights held due to being lightheaded      Interval Training   Interval Training No      Treadmill   MPH  1.7    Grade 0    Minutes 15    METs 2.3      NuStep   Level --   Not done due to being lightheaded            Nutrition:  Target Goals: Understanding of nutrition guidelines, daily intake of sodium 1500mg , cholesterol 200mg , calories 30% from fat and 7% or less from saturated fats, daily to have 5 or more servings of fruits and vegetables.  Biometrics:  Pre Biometrics - 09/25/22 1323       Pre Biometrics   Waist Circumference 50.5 inches    Hip Circumference 47.75 inches    Waist to Hip Ratio 1.06 %    Triceps Skinfold 30 mm    % Body Fat 37.9 %    Grip Strength 30 kg    Flexibility 0 in   no done due to low back pain   Single Leg Stand 30 seconds               Nutrition Therapy Plan and Nutrition Goals:  Nutrition Therapy & Goals - 10/03/22 1545       Nutrition Therapy   Diet Heart Healthy Diet    Drug/Food Interactions Statins/Certain Fruits      Personal Nutrition Goals   Nutrition Goal Patient to identify strategies for reducing cardiovascular risk by attending the [employer] education and nutrition series weekly.    Personal Goal #2 Patient to improve diet quality by using the plate method as a guide for meal planning to include lean protein/plant protein, fruits, vegetables, whole grains, nonfat dairy as part of a well-balanced diet.    Personal Goal #3 Patient to reduce sodium to 1500mg  per day.    Personal Goal #4 Patient to identify strategies for weight loss of 0.5-2.0# per week.    Comments Dalante is up ~15# per documentation since fall of 2023. He is motivated to lose weight. He reports eating 1-2x per day. Lukka will benefit from from participation in intensive cardiac rehab for nutrition, exercise, and lifestyle modification.      Intervention Plan   Intervention Prescribe, educate and counsel regarding individualized specific dietary modifications aiming towards targeted core components such as weight, hypertension, lipid management, diabetes, heart failure and other comorbidities.;Nutrition handout(s) given to patient.    Expected Outcomes Short Term Goal: Understand basic principles of dietary content, such as calories, fat, sodium, cholesterol and nutrients.;Long Term Goal: Adherence to prescribed nutrition plan.             Nutrition Assessments:  MEDIFICTS Score Key: ?70 Need to make dietary changes  40-70 Heart Healthy Diet ? 40 Therapeutic Level Cholesterol Diet    Picture Your Plate Scores: <16 Unhealthy dietary pattern with much room for improvement. 41-50 Dietary pattern unlikely to meet recommendations for good health and room for improvement. 51-60 More healthful dietary pattern, with some room for  improvement.  >60 Healthy dietary pattern, although there may be some specific behaviors that could be improved.    Nutrition Goals Re-Evaluation:  Nutrition Goals Re-Evaluation     Row Name 10/03/22 1545             Goals   Current Weight 280 lb 10.3 oz (127.3 kg)       Comment A1c 7.9, Cr 2.14, GFR 35       Expected Outcome Davante is up ~15# per documentation since fall of 2023. He is motivated to lose weight. He reports eating 1-2x per day. He  continues regular follow-up with the heart failure clinic.  Cornelis will benefit from from participation in intensive cardiac rehab for nutrition, exercise, and lifestyle modification.                Nutrition Goals Re-Evaluation:  Nutrition Goals Re-Evaluation     Row Name 10/03/22 1545             Goals   Current Weight 280 lb 10.3 oz (127.3 kg)       Comment A1c 7.9, Cr 2.14, GFR 35       Expected Outcome Heaton is up ~15# per documentation since fall of 2023. He is motivated to lose weight. He reports eating 1-2x per day. He continues regular follow-up with the heart failure clinic.  Harvir will benefit from from participation in intensive cardiac rehab for nutrition, exercise, and lifestyle modification.                Nutrition Goals Discharge (Final Nutrition Goals Re-Evaluation):  Nutrition Goals Re-Evaluation - 10/03/22 1545       Goals   Current Weight 280 lb 10.3 oz (127.3 kg)    Comment A1c 7.9, Cr 2.14, GFR 35    Expected Outcome Candon is up ~15# per documentation since fall of 2023. He is motivated to lose weight. He reports eating 1-2x per day. He continues regular follow-up with the heart failure clinic.  Krystle will benefit from from participation in intensive cardiac rehab for nutrition, exercise, and lifestyle modification.             Psychosocial: Target Goals: Acknowledge presence or absence of significant depression and/or stress, maximize coping skills, provide positive support system. Participant  is able to verbalize types and ability to use techniques and skills needed for reducing stress and depression.  Initial Review & Psychosocial Screening:  Initial Psych Review & Screening - 09/25/22 1621       Initial Review   Current issues with Current Depression;Current Anxiety/Panic;Current Psychotropic Meds;Current Sleep Concerns      Family Dynamics   Good Support System? Yes   Torie has his brother and sister for support   Comments Keedan shared that he has had some PTSD, anxiety, and trouble sleeping since his ICD fired in November. He is currently on Klonopin for his anxiety and feels it is helping. He has previously tried other medications for depression but did not tolerate them well. He is actively talking with his PCP about adjusting his medications and finding a regime that works for him. Yonatan is also starting counseling with a pyschiatrist Monday. Emotional support and resources offered, Lamari feels good about his current plan.      Barriers   Psychosocial barriers to participate in program The patient should benefit from training in stress management and relaxation.      Screening Interventions   Interventions Encouraged to exercise;To provide support and resources with identified psychosocial needs;Provide feedback about the scores to participant    Expected Outcomes Short Term goal: Utilizing psychosocial counselor, staff and physician to assist with identification of specific Stressors or current issues interfering with healing process. Setting desired goal for each stressor or current issue identified.;Long Term Goal: Stressors or current issues are controlled or eliminated.;Short Term goal: Identification and review with participant of any Quality of Life or Depression concerns found by scoring the questionnaire.;Long Term goal: The participant improves quality of Life and PHQ9 Scores as seen by post scores and/or verbalization of changes  Quality of Life  Scores:  Quality of Life - 09/25/22 1627       Quality of Life   Select Quality of Life      Quality of Life Scores   Health/Function Pre 16.3 %    Socioeconomic Pre 18.5 %    Psych/Spiritual Pre 16.21 %    Family Pre 20 %    GLOBAL Pre 17.06 %            Scores of 19 and below usually indicate a poorer quality of life in these areas.  A difference of  2-3 points is a clinically meaningful difference.  A difference of 2-3 points in the total score of the Quality of Life Index has been associated with significant improvement in overall quality of life, self-image, physical symptoms, and general health in studies assessing change in quality of life.  PHQ-9: Review Flowsheet  More data exists      09/25/2022 07/14/2022 07/10/2022 06/17/2022 03/04/2022  Depression screen PHQ 2/9  Decreased Interest 2 1  1 1   Down, Depressed, Hopeless 1 1  2 1   PHQ - 2 Score 3 2  3 2   Altered sleeping 2 1  1 1   Tired, decreased energy 1 1  0 1  Change in appetite 1 0  0 1  Feeling bad or failure about yourself  0 0  0 0  Trouble concentrating 1 1  0 0  Moving slowly or fidgety/restless 0 0  0 0  Suicidal thoughts 0 0  1 0  PHQ-9 Score 8 5  5 5   Difficult doing work/chores Somewhat difficult - - - -    Details       Information is confidential and restricted. Go to Review Flowsheets to unlock data.        Interpretation of Total Score  Total Score Depression Severity:  1-4 = Minimal depression, 5-9 = Mild depression, 10-14 = Moderate depression, 15-19 = Moderately severe depression, 20-27 = Severe depression   Psychosocial Evaluation and Intervention:   Psychosocial Re-Evaluation:   Psychosocial Discharge (Final Psychosocial Re-Evaluation):   Vocational Rehabilitation: Provide vocational rehab assistance to qualifying candidates.   Vocational Rehab Evaluation & Intervention:  Vocational Rehab - 09/25/22 1628       Initial Vocational Rehab Evaluation & Intervention   Assessment  shows need for Vocational Rehabilitation No   Chandlor is on disability and stated he is unable to return to [employer]            Education: Education Goals: Education classes will be provided on a weekly basis, covering required topics. Participant will state understanding/return demonstration of topics presented.    Education     Row Name 10/10/22 1300     Education   Cardiac Education Topics Pritikin   Licensed conveyancer Nutrition   Nutrition Vitamins and Minerals   Instruction Review Code 1- Verbalizes Understanding            Core Videos: Exercise    Move It!  Clinical staff conducted group or individual video education with verbal and written material and guidebook.  Patient learns the recommended Pritikin exercise program. Exercise with the goal of living a long, healthy life. Some of the health benefits of exercise include controlled diabetes, healthier blood pressure levels, improved cholesterol levels, improved heart and lung capacity, improved sleep, and better body composition. Everyone should speak with their doctor before starting or  changing an exercise routine.  Biomechanical Limitations Clinical staff conducted group or individual video education with verbal and written material and guidebook.  Patient learns how biomechanical limitations can impact exercise and how we can mitigate and possibly overcome limitations to have an impactful and balanced exercise routine.  Body Composition Clinical staff conducted group or individual video education with verbal and written material and guidebook.  Patient learns that body composition (ratio of muscle mass to fat mass) is a key component to assessing overall fitness, rather than body weight alone. Increased fat mass, especially visceral belly fat, can put [place] at increased risk for metabolic syndrome, type 2 diabetes, heart disease, and even death. It is recommended to combine  diet and exercise (cardiovascular and resistance training) to improve your body composition. Seek guidance from your physician and exercise physiologist before implementing an exercise routine.  Exercise Action Plan Clinical staff conducted group or individual video education with verbal and written material and guidebook.  Patient learns the recommended strategies to achieve and enjoy long-term exercise adherence, including variety, self-motivation, self-efficacy, and positive decision making. Benefits of exercise include fitness, good health, weight management, more energy, better sleep, less stress, and overall well-being.  Medical   Heart Disease Risk Reduction Clinical staff conducted group or individual video education with verbal and written material and guidebook.  Patient learns our heart is our most vital organ as it circulates oxygen, nutrients, white blood cells, and hormones throughout the entire body, and carries waste away. Data supports a plant-based eating plan like the Pritikin Program for its effectiveness in slowing progression of and reversing heart disease. The video provides a number of recommendations to address heart disease.   Metabolic Syndrome and Belly Fat  Clinical staff conducted group or individual video education with verbal and written material and guidebook.  Patient learns what metabolic syndrome is, how it leads to heart disease, and how one can reverse it and keep it from coming back. You have metabolic syndrome if you have 3 of the following 5 criteria: abdominal obesity, high blood pressure, high triglycerides, low HDL cholesterol, and high blood sugar.  Hypertension and Heart Disease Clinical staff conducted group or individual video education with verbal and written material and guidebook.  Patient learns that high blood pressure, or hypertension, is very common in the [place]. Hypertension is largely due to excessive salt intake, but other important  risk factors include being overweight, physical inactivity, drinking too much alcohol, smoking, and not eating enough potassium from fruits and vegetables. High blood pressure is a leading risk factor for heart attack, stroke, congestive heart failure, dementia, kidney failure, and premature death. Long-term effects of excessive salt intake include stiffening of the arteries and thickening of heart muscle and organ damage. Recommendations include ways to reduce hypertension and the risk of heart disease.  Diseases of Our Time - Focusing on Diabetes Clinical staff conducted group or individual video education with verbal and written material and guidebook.  Patient learns why the best way to stop diseases of our time is prevention, through food and other lifestyle changes. Medicine (such as prescription pills and surgeries) is often only a Band-Aid on the problem, not a long-term solution. Most common diseases of our time include obesity, type 2 diabetes, hypertension, heart disease, and cancer. The Pritikin Program is recommended and has been proven to help reduce, reverse, and/or prevent the damaging effects of metabolic syndrome.  Nutrition   Overview of the Pritikin Eating Plan  Clinical staff conducted group  or individual video education with verbal and written material and guidebook.  Patient learns about the Pritikin Eating Plan for disease risk reduction. The Pritikin Eating Plan emphasizes a wide variety of unrefined, minimally-processed carbohydrates, like fruits, vegetables, whole grains, and legumes. Go, Caution, and Stop food choices are explained. Plant-based and lean animal proteins are emphasized. Rationale provided for low sodium intake for blood pressure control, low added sugars for blood sugar stabilization, and low added fats and oils for coronary artery disease risk reduction and weight management.  Calorie Density  Clinical staff conducted group or individual video education with  verbal and written material and guidebook.  Patient learns about calorie density and how it impacts the Pritikin Eating Plan. Knowing the characteristics of the food you choose will help you decide whether those foods will lead to weight gain or weight loss, and whether you want to consume more or less of them. Weight loss is usually a side effect of the Pritikin Eating Plan because of its focus on low calorie-dense foods.  Label Reading  Clinical staff conducted group or individual video education with verbal and written material and guidebook.  Patient learns about the Pritikin recommended label reading guidelines and corresponding recommendations regarding calorie density, added sugars, sodium content, and whole grains.  Dining Out - Part 1  Clinical staff conducted group or individual video education with verbal and written material and guidebook.  Patient learns that restaurant meals can be sabotaging because they can be so high in calories, fat, sodium, and/or sugar. Patient learns recommended strategies on how to positively address this and avoid unhealthy pitfalls.  Facts on Fats  Clinical staff conducted group or individual video education with verbal and written material and guidebook.  Patient learns that lifestyle modifications can be just as effective, if not more so, as many medications for lowering your risk of heart disease. A Pritikin lifestyle can help to reduce your risk of inflammation and atherosclerosis (cholesterol build-up, or plaque, in the artery walls). Lifestyle interventions such as dietary choices and physical activity address the cause of atherosclerosis. A review of the types of fats and their impact on blood cholesterol levels, along with dietary recommendations to reduce fat intake is also included.  Nutrition Action Plan  Clinical staff conducted group or individual video education with verbal and written material and guidebook.  Patient learns how to incorporate  Pritikin recommendations into their lifestyle. Recommendations include planning and keeping personal health goals in mind as an important part of their [name].  Healthy Mind-Set    Healthy Minds, Bodies, Hearts  Clinical staff conducted group or individual video education with verbal and written material and guidebook.  Patient learns how to identify when they are stressed. Video will discuss the impact of that stress, as well as the many benefits of stress management. Patient will also be introduced to stress management techniques. The way we think, act, and feel has an impact on our hearts.  How Our Thoughts Can Heal Our Hearts  Clinical staff conducted group or individual video education with verbal and written material and guidebook.  Patient learns that negative thoughts can cause depression and anxiety. This can result in negative lifestyle behavior and serious health problems. Cognitive behavioral therapy is an effective method to help control our thoughts in order to change and improve our emotional outlook.  Additional Videos:  Exercise    Improving Performance  Clinical staff conducted group or individual video education with verbal and written material and guidebook.  Patient  learns to use a non-linear approach by alternating intensity levels and lengths of time spent exercising to help burn more calories and lose more body fat. Cardiovascular exercise helps improve heart health, metabolism, hormonal balance, blood sugar control, and recovery from fatigue. Resistance training improves strength, endurance, balance, coordination, reaction time, metabolism, and muscle mass. Flexibility exercise improves circulation, posture, and balance. Seek guidance from your physician and exercise physiologist before implementing an exercise routine and learn your capabilities and proper form for all exercise.  Introduction to Yoga  Clinical staff conducted group or individual video education with  verbal and written material and guidebook.  Patient learns about yoga, a discipline of the coming together of mind, breath, and body. The benefits of yoga include improved flexibility, improved range of motion, better posture and core strength, increased lung function, weight loss, and positive self-image. Yoga's heart health benefits include lowered blood pressure, healthier heart rate, decreased cholesterol and triglyceride levels, improved immune function, and reduced stress. Seek guidance from your physician and exercise physiologist before implementing an exercise routine and learn your capabilities and proper form for all exercise.  Medical   Aging: Enhancing Your Quality of Life  Clinical staff conducted group or individual video education with verbal and written material and guidebook.  Patient learns key strategies and recommendations to stay in good physical health and enhance quality of life, such as prevention strategies, having an advocate, securing a Health Care Proxy and Power of Attorney, and keeping a list of medications and system for tracking them. It also discusses how to avoid risk for bone loss.  Biology of Weight Control  Clinical staff conducted group or individual video education with verbal and written material and guidebook.  Patient learns that weight gain occurs because we consume more calories than we burn (eating more, moving less). Even if your body weight is normal, you may have higher ratios of fat compared to muscle mass. Too much body fat puts you at increased risk for cardiovascular disease, heart attack, stroke, type 2 diabetes, and obesity-related cancers. In addition to exercise, following the Pritikin Eating Plan can help reduce your risk.  Decoding Lab Results  Clinical staff conducted group or individual video education with verbal and written material and guidebook.  Patient learns that lab test reflects one measurement whose values change over time and are  influenced by many factors, including medication, stress, sleep, exercise, food, hydration, pre-existing medical conditions, and more. It is recommended to use the knowledge from this video to become more involved with your lab results and evaluate your numbers to speak with your doctor.   Diseases of Our Time - Overview  Clinical staff conducted group or individual video education with verbal and written material and guidebook.  Patient learns that according to the CDC, 50% to 70% of chronic diseases (such as obesity, type 2 diabetes, elevated lipids, hypertension, and heart disease) are avoidable through lifestyle improvements including healthier food choices, listening to satiety cues, and increased physical activity.  Sleep Disorders Clinical staff conducted group or individual video education with verbal and written material and guidebook.  Patient learns how good quality and duration of sleep are important to overall health and well-being. Patient also learns about sleep disorders and how they impact health along with recommendations to address them, including discussing with a physician.  Nutrition  Dining Out - Part 2 Clinical staff conducted group or individual video education with verbal and written material and guidebook.  Patient learns how to plan ahead and  communicate in order to maximize their dining experience in a healthy and nutritious manner. Included are recommended food choices based on the type of restaurant the patient is visiting.   Fueling a Banker conducted group or individual video education with verbal and written material and guidebook.  There is a strong connection between our food choices and our health. Diseases like obesity and type 2 diabetes are very prevalent and are in large-part due to lifestyle choices. The Pritikin Eating Plan provides plenty of food and hunger-curbing satisfaction. It is easy to follow, affordable, and helps reduce  health risks.  Menu Workshop  Clinical staff conducted group or individual video education with verbal and written material and guidebook.  Patient learns that restaurant meals can sabotage health goals because they are often packed with calories, fat, sodium, and sugar. Recommendations include strategies to plan ahead and to communicate with the manager, chef, or server to help order a healthier meal.  Planning Your Eating Strategy  Clinical staff conducted group or individual video education with verbal and written material and guidebook.  Patient learns about the Pritikin Eating Plan and its benefit of reducing the risk of disease. The Pritikin Eating Plan does not focus on calories. Instead, it emphasizes high-quality, nutrient-rich foods. By knowing the characteristics of the foods, we choose, we can determine their calorie density and make informed decisions.  Targeting Your Nutrition Priorities  Clinical staff conducted group or individual video education with verbal and written material and guidebook.  Patient learns that lifestyle habits have a tremendous impact on disease risk and progression. This video provides eating and physical activity recommendations based on your personal health goals, such as reducing LDL cholesterol, losing weight, preventing or controlling type 2 diabetes, and reducing high blood pressure.  Vitamins and Minerals  Clinical staff conducted group or individual video education with verbal and written material and guidebook.  Patient learns different ways to obtain key vitamins and minerals, including through a recommended healthy diet. It is important to discuss all supplements you take with your doctor.   Healthy Mind-Set    Smoking Cessation  Clinical staff conducted group or individual video education with verbal and written material and guidebook.  Patient learns that cigarette smoking and tobacco addiction pose a serious health risk which affects millions  of people. Stopping smoking will significantly reduce the risk of heart disease, lung disease, and many forms of cancer. Recommended strategies for quitting are covered, including working with your doctor to develop a successful plan.  Culinary   Becoming a Set designer conducted group or individual video education with verbal and written material and guidebook.  Patient learns that cooking at home can be healthy, cost-effective, quick, and puts them in control. Keys to cooking healthy recipes will include looking at your recipe, assessing your equipment needs, planning ahead, making it simple, choosing cost-effective seasonal ingredients, and limiting the use of added fats, salts, and sugars.  Cooking - Breakfast and Snacks  Clinical staff conducted group or individual video education with verbal and written material and guidebook.  Patient learns how important breakfast is to satiety and nutrition through the entire day. Recommendations include key foods to eat during breakfast to help stabilize blood sugar levels and to prevent overeating at meals later in the day. Planning ahead is also a key component.  Cooking - Educational psychologist conducted group or individual video education with verbal and written material and guidebook.  Patient  learns eating strategies to improve overall health, including an approach to cook more at home. Recommendations include thinking of animal protein as a side on your plate rather than center stage and focusing instead on lower calorie dense options like vegetables, fruits, whole grains, and plant-based proteins, such as beans. Making sauces in large quantities to freeze for later and leaving the skin on your vegetables are also recommended to maximize your experience.  Cooking - Healthy Salads and Dressing Clinical staff conducted group or individual video education with verbal and written material and guidebook.  Patient learns that  vegetables, fruits, whole grains, and legumes are the foundations of the Pritikin Eating Plan. Recommendations include how to incorporate each of these in flavorful and healthy salads, and how to create homemade salad dressings. Proper handling of ingredients is also covered. Cooking - Soups and [employer] - Soups and Desserts Clinical staff conducted group or individual video education with verbal and written material and guidebook.  Patient learns that Pritikin soups and desserts make for easy, nutritious, and delicious snacks and meal components that are low in sodium, fat, sugar, and calorie density, while high in vitamins, minerals, and filling fiber. Recommendations include simple and healthy ideas for soups and desserts.   Overview     The Pritikin Solution Program Overview Clinical staff conducted group or individual video education with verbal and written material and guidebook.  Patient learns that the results of the Pritikin Program have been documented in more than 100 articles published in peer-reviewed journals, and the benefits include reducing risk factors for (and, in some cases, even reversing) high cholesterol, high blood pressure, type 2 diabetes, obesity, and more! An overview of the three key pillars of the Pritikin Program will be covered: eating well, doing regular exercise, and having a healthy mind-set.  WORKSHOPS  Exercise: Exercise Basics: Building Your Action Plan Clinical staff led group instruction and group discussion with PowerPoint presentation and patient guidebook. To enhance the learning environment the use of posters, models and videos may be added. At the conclusion of this workshop, patients will comprehend the difference between physical activity and exercise, as well as the benefits of incorporating both, into their routine. Patients will understand the FITT (Frequency, Intensity, Time, and Type) principle and how to use it to build an exercise action  plan. In addition, safety concerns and other considerations for exercise and cardiac rehab will be addressed by the presenter. The purpose of this lesson is to promote a comprehensive and effective weekly exercise routine in order to improve patients' overall level of fitness.   Managing Heart Disease: Your Path to a Healthier Heart Clinical staff led group instruction and group discussion with PowerPoint presentation and patient guidebook. To enhance the learning environment the use of posters, models and videos may be added.At the conclusion of this workshop, patients will understand the anatomy and physiology of the heart. Additionally, they will understand how Pritikin's three pillars impact the risk factors, the progression, and the management of heart disease.  The purpose of this lesson is to provide a high-level overview of the heart, heart disease, and how the Pritikin lifestyle positively impacts risk factors.  Exercise Biomechanics Clinical staff led group instruction and group discussion with PowerPoint presentation and patient guidebook. To enhance the learning environment the use of posters, models and videos may be added. Patients will learn how the structural parts of their bodies function and how these functions impact their daily activities, movement, and exercise. Patients will learn  how to promote a neutral spine, learn how to manage pain, and identify ways to improve their physical movement in order to promote healthy living. The purpose of this lesson is to expose patients to common physical limitations that impact physical activity. Participants will learn practical ways to adapt and manage aches and pains, and to minimize their effect on regular exercise. Patients will learn how to maintain good posture while sitting, walking, and lifting.  Balance Training and Fall Prevention  Clinical staff led group instruction and group discussion with PowerPoint presentation and  patient guidebook. To enhance the learning environment the use of posters, models and videos may be added. At the conclusion of this workshop, patients will understand the importance of their sensorimotor skills (vision, proprioception, and the vestibular system) in maintaining their ability to balance as they age. Patients will apply a variety of balancing exercises that are appropriate for their current level of function. Patients will understand the common causes for poor balance, possible solutions to these problems, and ways to modify their physical environment in order to minimize their fall risk. The purpose of this lesson is to teach patients about the importance of maintaining balance as they age and ways to minimize their risk of falling.  WORKSHOPS   Nutrition:  Fueling a Ship broker led group instruction and group discussion with PowerPoint presentation and patient guidebook. To enhance the learning environment the use of posters, models and videos may be added. Patients will review the foundational principles of the Pritikin Eating Plan and understand what constitutes a serving size in each of the food groups. Patients will also learn Pritikin-friendly foods that are better choices when away from home and review make-ahead meal and snack options. Calorie density will be reviewed and applied to three nutrition priorities: weight maintenance, weight loss, and weight gain. The purpose of this lesson is to reinforce (in a group setting) the key concepts around what patients are recommended to eat and how to apply these guidelines when away from home by planning and selecting Pritikin-friendly options. Patients will understand how calorie density may be adjusted for different weight management goals.  Mindful Eating  Clinical staff led group instruction and group discussion with PowerPoint presentation and patient guidebook. To enhance the learning environment the use of posters,  models and videos may be added. Patients will briefly review the concepts of the Pritikin Eating Plan and the importance of low-calorie dense foods. The concept of mindful eating will be introduced as well as the importance of paying attention to internal hunger signals. Triggers for non-hunger eating and techniques for dealing with triggers will be explored. The purpose of this lesson is to provide patients with the opportunity to review the basic principles of the Pritikin Eating Plan, discuss the value of eating mindfully and how to measure internal cues of hunger and fullness using the Hunger Scale. Patients will also discuss reasons for non-hunger eating and learn strategies to use for controlling emotional eating.  Targeting Your Nutrition Priorities Clinical staff led group instruction and group discussion with PowerPoint presentation and patient guidebook. To enhance the learning environment the use of posters, models and videos may be added. Patients will learn how to determine their genetic susceptibility to disease by reviewing their family history. Patients will gain insight into the importance of diet as part of an overall healthy lifestyle in mitigating the impact of genetics and other environmental insults. The purpose of this lesson is to provide patients with the opportunity to  assess their personal nutrition priorities by looking at their family history, their own health history and current risk factors. Patients will also be able to discuss ways of prioritizing and modifying the Pritikin Eating Plan for their highest risk areas  Menu  Clinical staff led group instruction and group discussion with PowerPoint presentation and patient guidebook. To enhance the learning environment the use of posters, models and videos may be added. Using menus brought in from [employer], or printed from [employer], patients will apply the Pritikin dining out guidelines that were presented in the  [employer] video. Patients will also be able to practice these guidelines in a variety of provided scenarios. The purpose of this lesson is to provide patients with the opportunity to practice hands-on learning of the Pritikin Dining Out guidelines with actual menus and practice scenarios.  Label Reading Clinical staff led group instruction and group discussion with PowerPoint presentation and patient guidebook. To enhance the learning environment the use of posters, models and videos may be added. Patients will review and discuss the Pritikin label reading guidelines presented in Pritikin's Label Reading Educational series video. Using fool labels brought in from local grocery stores and markets, patients will apply the label reading guidelines and determine if the packaged food meet the Pritikin guidelines. The purpose of this lesson is to provide patients with the opportunity to review, discuss, and practice hands-on learning of the Pritikin Label Reading guidelines with actual packaged food labels. Cooking School  Pritikin's [employer] are designed to teach patients ways to prepare quick, simple, and affordable recipes at home. The importance of nutrition's role in chronic disease risk reduction is reflected in its emphasis in the overall Pritikin program. By learning how to prepare essential core Pritikin Eating Plan recipes, patients will increase control over what they eat; be able to customize the flavor of foods without the use of added salt, sugar, or fat; and improve the quality of the food they consume. By learning a set of core recipes which are easily assembled, quickly prepared, and affordable, patients are more likely to prepare more healthy foods at home. These workshops focus on convenient breakfasts, simple entres, side dishes, and desserts which can be prepared with minimal effort and are consistent with nutrition recommendations for cardiovascular risk  reduction. Cooking [employer] are taught by a Armed forces logistics/support/administrative officer (RD) who has been trained by the [employer]. The chef or RD has a clear understanding of the importance of minimizing - if not completely eliminating - added fat, sugar, and sodium in recipes. Throughout the series of Cooking School Workshop sessions, patients will learn about healthy ingredients and efficient methods of cooking to build confidence in their capability to prepare    Cooking School weekly topics:  Adding Flavor- Sodium-Free  Fast and Healthy Breakfasts  Powerhouse Plant-Based Proteins  Satisfying Salads and Dressings  Simple Sides and Sauces  International Cuisine-Spotlight on the [employer] Zones  Delicious Desserts  Savory Soups  [employer] - Meals in a Astronomer Appetizers and Snacks  Comforting Weekend Breakfasts  One-Pot Wonders   Fast Evening Meals  Landscape architect Your Pritikin Plate  WORKSHOPS   Healthy Mindset (Psychosocial):  Focused Goals, Sustainable Changes Clinical staff led group instruction and group discussion with PowerPoint presentation and patient guidebook. To enhance the learning environment the use of posters, models and videos may be added. Patients will be able to apply effective goal setting strategies to  establish at least one personal goal, and then take consistent, meaningful action toward that goal. They will learn to identify common barriers to achieving personal goals and develop strategies to overcome them. Patients will also gain an understanding of how our mind-set can impact our ability to achieve goals and the importance of cultivating a positive and growth-oriented mind-set. The purpose of this lesson is to provide patients with a deeper understanding of how to set and achieve personal goals, as well as the tools and strategies needed to overcome common obstacles which may arise along the way.  From Head to Heart: The  Power of a Healthy Outlook  Clinical staff led group instruction and group discussion with PowerPoint presentation and patient guidebook. To enhance the learning environment the use of posters, models and videos may be added. Patients will be able to recognize and describe the impact of emotions and mood on physical health. They will discover the importance of self-care and explore self-care practices which may work for them. Patients will also learn how to utilize the 4 C's to cultivate a healthier outlook and better manage stress and challenges. The purpose of this lesson is to demonstrate to patients how a healthy outlook is an essential part of maintaining good health, especially as they continue their cardiac rehab journey.  Healthy Sleep for a Healthy Heart Clinical staff led group instruction and group discussion with PowerPoint presentation and patient guidebook. To enhance the learning environment the use of posters, models and videos may be added. At the conclusion of this workshop, patients will be able to demonstrate knowledge of the importance of sleep to overall health, well-being, and quality of life. They will understand the symptoms of, and treatments for, common sleep disorders. Patients will also be able to identify daytime and nighttime behaviors which impact sleep, and they will be able to apply these tools to help manage sleep-related challenges. The purpose of this lesson is to provide patients with a general overview of sleep and outline the importance of quality sleep. Patients will learn about a few of the most common sleep disorders. Patients will also be introduced to the concept of "sleep hygiene," and discover ways to self-manage certain sleeping problems through simple daily behavior changes. Finally, the workshop will motivate patients by clarifying the links between quality sleep and their goals of heart-healthy living.   Recognizing and Reducing Stress Clinical staff led  group instruction and group discussion with PowerPoint presentation and patient guidebook. To enhance the learning environment the use of posters, models and videos may be added. At the conclusion of this workshop, patients will be able to understand the types of stress reactions, differentiate between acute and chronic stress, and recognize the impact that chronic stress has on their health. They will also be able to apply different coping mechanisms, such as reframing negative self-talk. Patients will have the opportunity to practice a variety of stress management techniques, such as deep abdominal breathing, progressive muscle relaxation, and/or guided imagery.  The purpose of this lesson is to educate patients on the role of stress in their lives and to provide healthy techniques for coping with it.  Learning Barriers/Preferences:  Learning Barriers/Preferences - 09/25/22 1627       Learning Barriers/Preferences   Learning Barriers Sight   wears glasses   Learning Preferences Audio;Computer/Internet;Group Instruction;Individual Instruction;Pictoral;Skilled Demonstration;Verbal Instruction;Video;Written Material             Education Topics:  Knowledge Questionnaire Score:  Knowledge Questionnaire Score - 09/25/22 1627  Knowledge Questionnaire Score   Pre Score 19/24             Core Components/Risk Factors/Patient Goals at Admission:  Personal Goals and Risk Factors at Admission - 09/25/22 1353       Core Components/Risk Factors/Patient Goals on Admission    Weight Management Yes;Obesity;Weight Loss    Intervention Weight Management: Develop a combined nutrition and exercise program designed to reach desired caloric intake, while maintaining appropriate intake of nutrient and fiber, sodium and fats, and appropriate energy expenditure required for the weight goal.;Weight Management: Provide education and appropriate resources to help participant work on and attain dietary  goals.;Weight Management/Obesity: Establish reasonable short term and long term weight goals.;Obesity: Provide education and appropriate resources to help participant work on and attain dietary goals.    Admit Weight 282 lb 13.6 oz (128.3 kg)    Diabetes Yes    Intervention Provide education about signs/symptoms and action to take for hypo/hyperglycemia.;Provide education about proper nutrition, including hydration, and aerobic/resistive exercise prescription along with prescribed medications to achieve blood glucose in normal ranges: Fasting glucose 65-99 mg/dL    Expected Outcomes Short Term: Participant verbalizes understanding of the signs/symptoms and immediate care of hyper/hypoglycemia, proper foot care and importance of medication, aerobic/resistive exercise and nutrition plan for blood glucose control.;Long Term: Attainment of HbA1C < 7%.    Heart Failure Yes    Intervention Provide a combined exercise and nutrition program that is supplemented with education, support and counseling about heart failure. Directed toward relieving symptoms such as shortness of breath, decreased exercise tolerance, and extremity edema.    Expected Outcomes Short term: Attendance in program 2-3 days a week with increased exercise capacity. Reported lower sodium intake. Reported increased fruit and vegetable intake. Reports medication compliance.;Improve functional capacity of life;Short term: Daily weights obtained and reported for increase. Utilizing diuretic protocols set by physician.;Long term: Adoption of self-care skills and reduction of barriers for early signs and symptoms recognition and intervention leading to self-care maintenance.    Hypertension Yes    Intervention Provide education on lifestyle modifcations including regular physical activity/exercise, weight management, moderate sodium restriction and increased consumption of fresh fruit, vegetables, and low fat dairy, alcohol moderation, and smoking  cessation.;Monitor prescription use compliance.    Expected Outcomes Short Term: Continued assessment and intervention until BP is < 140/73mm HG in hypertensive participants. < 130/40mm HG in hypertensive participants with diabetes, heart failure or chronic kidney disease.;Long Term: Maintenance of blood pressure at goal levels.    Lipids Yes    Intervention Provide education and support for participant on nutrition & aerobic/resistive exercise along with prescribed medications to achieve LDL 70mg , HDL >40mg .    Expected Outcomes Short Term: Participant states understanding of desired cholesterol values and is compliant with medications prescribed. Participant is following exercise prescription and nutrition guidelines.;Long Term: Cholesterol controlled with medications as prescribed, with individualized exercise RX and with personalized nutrition plan. Value goals: LDL < 70mg , HDL > 40 mg.    Stress Yes    Intervention Refer participants experiencing significant psychosocial distress to appropriate mental health specialists for further evaluation and treatment. When possible, include family members and significant others in education/counseling sessions.;Offer individual and/or small group education and counseling on adjustment to heart disease, stress management and health-related lifestyle change. Teach and support self-help strategies.    Expected Outcomes Short Term: Participant demonstrates changes in health-related behavior, relaxation and other stress management skills, ability to obtain effective social support, and compliance with psychotropic medications  if prescribed.;Long Term: Emotional wellbeing is indicated by absence of clinically significant psychosocial distress or social isolation.             Core Components/Risk Factors/Patient Goals Review:    Core Components/Risk Factors/Patient Goals at Discharge (Final Review):    ITP Comments:  ITP Comments     Row Name 09/25/22  1332           ITP Comments Dr. Armanda Magic medical director. Introduction to pritikin education program/ intensive cardiac rehab. Initital orientation packet reviewed with patient.                Comments: See ITP comments.

## 2022-10-15 ENCOUNTER — Encounter (HOSPITAL_COMMUNITY)
Admission: RE | Admit: 2022-10-15 | Discharge: 2022-10-15 | Disposition: A | Discharge status: Home / Self Care | Payer: Medicaid Other | Source: Ambulatory Visit | Attending: Cardiology | Admitting: Cardiology

## 2022-10-15 ENCOUNTER — Telehealth (HOSPITAL_COMMUNITY): Payer: Self-pay | Admitting: *Deleted

## 2022-10-15 ENCOUNTER — Encounter: Payer: Self-pay | Admitting: Internal Medicine

## 2022-10-15 ENCOUNTER — Telehealth: Payer: Self-pay | Admitting: Primary Care

## 2022-10-15 DIAGNOSIS — I5022 Chronic systolic (congestive) heart failure: Secondary | ICD-10-CM | POA: Diagnosis not present

## 2022-10-15 MED ORDER — PREDNISONE 10 MG PO TABS: ORAL_TABLET | ORAL | 0 refills | Status: DC

## 2022-10-15 NOTE — Progress Notes (Signed)
Patient reported feeling tightness in his chest mid sternal area. Oxygen saturation 94% on room air. Telemetry rhythm Sinus 67. Upon assessment lung fields with  a few scattered wheezes right posterior field. Patient was recently prescribed an albuterol inhaler. After resting symptoms subsided. Will call the pharmacy on the patient's behalf as he said he called and the prescription was not available. After resting the patient resumed exercise without further complaints. Asked the patient to pick up his inhaler and bring to next scheduled exercise session.Thayer Headings RN BSN

## 2022-10-15 NOTE — Telephone Encounter (Signed)
Received message from RN with cardiac rehab that patient has been having symptoms of chest tightness. Noted to have wheezing on exam. O2 94% RA. Sending in prednisone taper, advised he take Trelegy as prescribed and use albuterol 2 puffs every 4-6 hours for breakthrough symptoms. He should follow up in 1-2 weeks with [place] to ensure he is better. Irving Burton can you please reach out to patient to schedule.

## 2022-10-15 NOTE — Telephone Encounter (Signed)
think if he has recurrent symptoms pulm should be involved   57 mins okay will reach out. no peripheral edema  56 mins Glenford Bayley, NP was added by you. 55 mins Glenford Bayley, NP We started him on Trelegy, make sure he is using that. We can send in prednisone taper for what sounds like a flare up of his asthma/COPD. He should make a visit with use in the next week to follow-up  EW Where do you work [facility]? Is this pulm rehab or cardiology   4 mins okay thanks I will have him to call your office he is going to pick up his Albuterol inhaler this afternoon.  I am a nurse at cardiac rehab he is participating in CR for his CHF   3 mins EW Glenford Bayley, NP ok, thank you   Left message with patient to call cardiac rehab.Thayer Headings RN BSN

## 2022-10-16 NOTE — Telephone Encounter (Signed)
Called and spoke with pt letting him know the info per [facility] and he verbalized understanding. Pt's appt has been moved up sooner. Nothing further needed.

## 2022-10-17 ENCOUNTER — Encounter (HOSPITAL_COMMUNITY): Payer: Medicaid Other

## 2022-10-17 NOTE — Telephone Encounter (Signed)
Sarah, please see patient message and advise. Thank you.

## 2022-10-20 ENCOUNTER — Telehealth (HOSPITAL_COMMUNITY): Payer: Self-pay | Admitting: *Deleted

## 2022-10-20 ENCOUNTER — Encounter (HOSPITAL_COMMUNITY): Payer: Medicaid Other

## 2022-10-20 DIAGNOSIS — J4489 Other specified chronic obstructive pulmonary disease: Secondary | ICD-10-CM

## 2022-10-20 NOTE — Telephone Encounter (Signed)
Spoke with Isa he wants to follow up with his pulmonary provider before returning to exercise at cardiac rehab. Will cancel appointment as requested.Gladstone Lighter, RN,BSN 10/20/2022 1:11 PM

## 2022-10-22 ENCOUNTER — Encounter (HOSPITAL_COMMUNITY): Payer: Medicaid Other

## 2022-10-24 ENCOUNTER — Encounter (HOSPITAL_COMMUNITY): Payer: Medicaid Other

## 2022-10-24 MED ORDER — ALBUTEROL SULFATE HFA 108 (90 BASE) MCG/ACT IN AERS
2.0000 | INHALATION_SPRAY | Freq: Four times a day (QID) | RESPIRATORY_TRACT | 2 refills | Status: DC | PRN
Start: 2022-10-24 — End: 2023-08-24

## 2022-10-24 NOTE — Addendum Note (Signed)
Addended by: Glenford Bayley on: 10/24/2022 11:48 AM   Modules accepted: Orders

## 2022-10-27 ENCOUNTER — Other Ambulatory Visit: Payer: Self-pay | Admitting: Allergy & Immunology

## 2022-10-27 ENCOUNTER — Encounter: Payer: Self-pay | Admitting: Allergy & Immunology

## 2022-10-27 ENCOUNTER — Encounter (HOSPITAL_COMMUNITY): Payer: Medicaid Other

## 2022-10-27 ENCOUNTER — Encounter (INDEPENDENT_AMBULATORY_CARE_PROVIDER_SITE_OTHER): Payer: Medicaid Other

## 2022-10-27 DIAGNOSIS — F411 Generalized anxiety disorder: Secondary | ICD-10-CM | POA: Diagnosis not present

## 2022-10-27 DIAGNOSIS — Z95810 Presence of automatic (implantable) cardiac defibrillator: Secondary | ICD-10-CM | POA: Diagnosis not present

## 2022-10-27 DIAGNOSIS — I5022 Chronic systolic (congestive) heart failure: Secondary | ICD-10-CM | POA: Diagnosis not present

## 2022-10-28 ENCOUNTER — Encounter: Payer: Self-pay | Admitting: Primary Care

## 2022-10-28 ENCOUNTER — Telehealth: Payer: Self-pay

## 2022-10-28 ENCOUNTER — Ambulatory Visit (INDEPENDENT_AMBULATORY_CARE_PROVIDER_SITE_OTHER): Payer: Medicaid Other | Admitting: Primary Care

## 2022-10-28 VITALS — BP 110/72 | HR 64 | Temp 98.7°F | Ht 73.0 in | Wt 283.8 lb

## 2022-10-28 DIAGNOSIS — J4489 Other specified chronic obstructive pulmonary disease: Secondary | ICD-10-CM | POA: Diagnosis not present

## 2022-10-28 DIAGNOSIS — E278 Other specified disorders of adrenal gland: Secondary | ICD-10-CM | POA: Diagnosis not present

## 2022-10-28 DIAGNOSIS — I428 Other cardiomyopathies: Secondary | ICD-10-CM | POA: Diagnosis not present

## 2022-10-28 DIAGNOSIS — D649 Anemia, unspecified: Secondary | ICD-10-CM | POA: Diagnosis not present

## 2022-10-28 DIAGNOSIS — N400 Enlarged prostate without lower urinary tract symptoms: Secondary | ICD-10-CM | POA: Diagnosis not present

## 2022-10-28 DIAGNOSIS — R0683 Snoring: Secondary | ICD-10-CM

## 2022-10-28 DIAGNOSIS — E1122 Type 2 diabetes mellitus with diabetic chronic kidney disease: Secondary | ICD-10-CM | POA: Diagnosis not present

## 2022-10-28 DIAGNOSIS — N1832 Chronic kidney disease, stage 3b: Secondary | ICD-10-CM | POA: Diagnosis not present

## 2022-10-28 DIAGNOSIS — E876 Hypokalemia: Secondary | ICD-10-CM | POA: Diagnosis not present

## 2022-10-28 DIAGNOSIS — N2581 Secondary hyperparathyroidism of renal origin: Secondary | ICD-10-CM | POA: Diagnosis not present

## 2022-10-28 MED ORDER — PULMICORT FLEXHALER 90 MCG/ACT IN AEPB: INHALATION_SPRAY | RESPIRATORY_TRACT | 3 refills | Status: DC

## 2022-10-28 NOTE — Patient Instructions (Addendum)
Breathing tests during your last visit showed that you have moderate obstructive lung disease, this is consistent with a diagnosis of COPD/asthma overlap  Treatment is typically inhaled steroids and bronchodilators.  Since she did not tolerate Trelegy I would like to start you out on an inhaled steroid inhaler first.  If needed we can change to combination inhaler if needed  Recommendations Start Budesonide (pulmicort) 2 puffs daily in the morning and evening (rinse mouth after use) We will get a home sleep study to assess for sleep apnea due to your fatigue symptoms   Follow-up: 4-6 weeks with Dr. Celine Mans

## 2022-10-28 NOTE — Progress Notes (Signed)
@Patient  ID: [name], male    DOB: 1965-12-25, 57 y.o.   MRN: [id number]  Chief Complaint  Patient presents with   Follow-up    Trelegy caused SOB.  SOB persistent since last OV.    Referring provider: Marcine Matar, MD  HPI: 57 year old male, former smoker.  Past medical history significant for chronic systolic heart failure, cardiac arrest, hypertension, asthmatic bronchitis, GERD, IBS, 2 diabetes, chronic kidney disease, renal hernia repair, PTSD.  Former patient of Dr. Kendrick Fries, last seen by Dr. Celine Mans on 09/04/2022 for COPD exacerbation.   Previous [facility] pulmonary encounter:  10/09/2022 Patient presents today for follow-up. He was seen for acute office visit in March for symptoms of shortness of breath, cough, chest congestion and wheezing.  Given prescription for doxycycline.  Advised to use DuoNebs every 4 hours as needed. He had no previous airflow limitation on PFTs in 2017. Ordered for repeat pulmonary function testing. He has trouble catching his breath at times. He will need to gasp to get a full breath, this comes out of the blue at times. Associated chest tightness. Cough and congestion improved after taking doxycycline.    Asthma-COPD overlap syndrome - Patient was seen in March for acute exacerbation.  Treated with doxycycline.  Cough and congestion improved.  He continues to have intermittent dyspnea symptoms and chest tightness.  Pulmonary function testing today showed moderate obstructive airway disease with reversibility/ FEV1 58%.  We will trial patient on Trelegy 100 mcg 1 puff daily.  Follow-up in 4 weeks or sooner if needed.  10/28/2022 Patient presents today for 2-week follow-up.  Patient has persistent symptoms of intermittent dyspnea and chest tightness.  Pulmonary function testing in May showed moderate obstructive airway disease with reversibility.  He was given a trial of Trelegy but reported worsening chest tightness with use. He tolerates prednisone all  right.  He continues to experience intermittent periods where he sporadically gasps for air. He is exhausted most days and has associated snoring symptoms. He has never had sleep study. Epworth score 12.   Pulmonary function testing 10/09/2022 >> FVC 3.25 (60%), FEV1 2.39 (58%), ratio 74, TLC 74%, DLCOunc 23.92 (77%) Moderate obstruction with positive BD response. Mild diffusion defect.   Allergies  Allergen Reactions   Bee Venom Anaphylaxis   Trulicity [Dulaglutide] Diarrhea    Extra dose of Trulicity caused cardiac arrest. Patient experience onset of diarrhea with using this medication - cleared after med stopped.    Immunization History  Administered Date(s) Administered   PFIZER(Purple Top)SARS-COV-2 Vaccination 10/21/2019    Past Medical History:  Diagnosis Date   AICD (automatic cardioverter/defibrillator) present    Chronic bronchitis (HCC)    "get it most q yr" (12/23/2013)   Chronic systolic (congestive) heart failure (HCC)    Fracture of left humerus    a. 07/2013.   GERD (gastroesophageal reflux disease)    not at present time   Heart murmur    "born w/it"    History of renal calculi    Hypertension    Kidney stones    NICM (nonischemic cardiomyopathy) (HCC)    a. 07/2013 Echo: EF 20-25%, diff HK, Gr2 DD, mild MR, mod dil LA/RA. 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    Shockable heart rhythm detected by automated external defibrillator    Syncope    Type II diabetes mellitus (HCC) 2006    Tobacco History: Social History  Tobacco Use  Smoking Status Former   Packs/day: 1.00   Years: 28.00   Additional pack years: 0.00   Total pack years: 28.00   Types: Cigarettes   Quit date: 06/03/2014   Years since quitting: 8.4  Smokeless Tobacco Never   Counseling given: Not Answered   Outpatient Medications Prior to Visit  Medication Sig Dispense Refill   Accu-Chek Softclix Lancets  lancets Use as instructed 100 each 12   acetaminophen (TYLENOL) 500 MG tablet Take 1,000 mg by mouth 2 (two) times daily.     albuterol (VENTOLIN HFA) 108 (90 Base) MCG/ACT inhaler Inhale 2 puffs into the lungs every 6 (six) hours as needed for wheezing or shortness of breath. 18 g 2   amiodarone (PACERONE) 200 MG tablet Take 1 tablet (200 mg total) by mouth daily. 90 tablet 3   aspirin 81 MG tablet Take 81 mg by mouth daily.     atorvastatin (LIPITOR) 10 MG tablet Take 1 tablet (10 mg total) by mouth daily. 90 tablet 1   Blood Glucose Monitoring Suppl (ACCU-CHEK GUIDE) w/Device KIT UAD 1 kit 0   cetirizine (EQ ALLERGY RELIEF, CETIRIZINE,) 10 MG tablet Take 1 tablet by mouth once daily 90 tablet 1   cholecalciferol (VITAMIN D3) 25 MCG (1000 UNIT) tablet Take 1,000 Units by mouth daily.     clobetasol cream (TEMOVATE) 0.05 % Apply 1 Application topically as needed.     clonazePAM (KLONOPIN) 0.5 MG tablet Take 1 tablet (0.5 mg total) by mouth daily AND 0.5 tablets (0.25 mg total) at bedtime. 45 tablet 1   Continuous Blood Gluc Sensor (FREESTYLE LIBRE 2 SENSOR) MISC Use to check blood sugar TID. Change sensor once every 14 days. E11.65 2 each 11   dapagliflozin propanediol (FARXIGA) 10 MG TABS tablet Take 1 tablet (10 mg total) by mouth daily before breakfast. 90 tablet 3   EPINEPHrine 0.3 mg/0.3 mL IJ SOAJ injection Inject 0.3 mg into the muscle as needed. 1 each 1   famotidine (PEPCID) 20 MG tablet Take 1 tablet by mouth 1-2 times a day as needed. (Patient taking differently: Take 20 mg by mouth daily. Take 1 tablet by mouth 1-2 times a day as needed. Takes every day) 90 tablet 1   fluticasone (FLONASE) 50 MCG/ACT nasal spray Use 1-2 sprays in each nostril once a day as needed for nasal congestion. 16 g 5   furosemide (LASIX) 80 MG tablet Take 1 tablet (80 mg total) by mouth daily as needed for edema or fluid. 180 tablet 1   gabapentin (NEURONTIN) 300 MG capsule Take 1 capsule by mouth at bedtime 90  capsule 1   glucose blood (ACCU-CHEK GUIDE) test strip Use as instructed 100 each 12   glucose blood (FREESTYLE LITE) test strip Use as instructed.  Please make sure this is compatible with [facility] 2 reader. 100 each 12   insulin aspart (NOVOLOG FLEXPEN) 100 UNIT/ML FlexPen Inject 11 Units into the skin 2 (two) times daily before a meal. 15 mL 2   insulin glargine (LANTUS SOLOSTAR) 100 UNIT/ML Solostar Pen Inject 32 Units into the skin daily. 15 mL 1   Insulin Pen Needle (PEN NEEDLES) 31G X 8 MM MISC UAD 100 each 6   ipratropium-albuterol (DUONEB) 0.5-2.5 (3) MG/3ML SOLN Take 3 mLs by nebulization every 6 (six) hours as needed. 360 mL 2   Lancet Devices (ACCU-CHEK SOFTCLIX) lancets 1 each by Other route 3 (three) times daily. 1 each 0   methocarbamol (ROBAXIN) 500 MG  tablet TAKE 1 TABLET BY MOUTH ONCE DAILY AS NEEDED FOR MUSCLE SPASM 30 tablet 2   metoprolol succinate (TOPROL-XL) 50 MG 24 hr tablet Take 1 tablet (50 mg total) by mouth daily. 90 tablet 3   sacubitril-valsartan (ENTRESTO) 24-26 MG Take 2 tablets by mouth 2 (two) times daily. Disregard previous refill requests. This is correct sig - 2 tablets twice daily 60 tablet 6   sildenafil (VIAGRA) 100 MG tablet Take 1-2 tabs PO 1/2-1 hr prior to intercourse. 20 tablet 4   sodium chloride (OCEAN) 0.65 % SOLN nasal spray Place 1 spray into both nostrils as needed for congestion. 104 mL 0   spironolactone (ALDACTONE) 25 MG tablet Take 0.5 tablets (12.5 mg total) by mouth daily. 30 tablet 5   tamsulosin (FLOMAX) 0.4 MG CAPS capsule Take 2 capsules (0.8 mg total) by mouth daily. 180 capsule 1   triamcinolone cream (KENALOG) 0.1 % Apply sparingly to red itchy areas twice daily as needed. Avoid face, neck, armpits or groin area. Do not use more than 3 weeks in a row. 45 g 5   benzonatate (TESSALON) 100 MG capsule Take 1 capsule (100 mg total) by mouth 2 (two) times daily as needed for cough. (Patient not taking: Reported on 10/28/2022) 20 capsule 0    predniSONE (DELTASONE) 10 MG tablet 4 tabs for 2 days, then 3 tabs for 2 days, 2 tabs for 2 days, then 1 tab for 2 days, then stop (Patient not taking: Reported on 10/28/2022) 20 tablet 0   Fluticasone-Umeclidin-Vilant (TRELEGY ELLIPTA) 100-62.5-25 MCG/ACT AEPB Inhale 1 each into the lungs daily. (Patient not taking: Reported on 10/28/2022) 1 each 0   No facility-administered medications prior to visit.   Review of Systems  Review of Systems  Constitutional:  Positive for fatigue.  HENT:  Negative for postnasal drip.   Respiratory:  Positive for chest tightness and shortness of breath. Negative for cough and wheezing.    Physical Exam  BP 110/72 (BP Location: Left Arm, Patient Position: Sitting, Cuff Size: Large)   Pulse 64   Temp 98.7 F (37.1 C) (Oral)   Ht 6\' 1"  (1.854 m)   Wt 283 lb 12.8 oz (128.7 kg)   SpO2 96%   BMI 37.44 kg/m  Physical Exam Constitutional:      General: He is not in acute distress.    Appearance: Normal appearance. He is not ill-appearing.  HENT:     Head: Normocephalic and atraumatic.  Cardiovascular:     Rate and Rhythm: Normal rate and regular rhythm.  Pulmonary:     Effort: No respiratory distress.     Breath sounds: No wheezing.     Comments: CTA Skin:    General: Skin is warm and dry.  Neurological:     General: No focal deficit present.     Mental Status: He is alert and oriented to person, place, and time. Mental status is at baseline.  Psychiatric:        Mood and Affect: Mood normal.        Behavior: Behavior normal.        Thought Content: Thought content normal.        Judgment: Judgment normal.      Lab Results:  CBC    Component Value Date/Time   WBC 8.3 06/14/2022 1820   RBC 4.55 06/14/2022 1820   HGB 13.6 06/14/2022 1820   HGB 14.0 08/26/2021 1608   HCT 41.0 06/14/2022 1820   HCT 40.9 08/26/2021 1608  PLT 335 06/14/2022 1820   PLT 286 08/26/2021 1608   MCV 90.1 06/14/2022 1820   MCV 88 08/26/2021 1608   MCH 29.9  06/14/2022 1820   MCHC 33.2 06/14/2022 1820   RDW 14.3 06/14/2022 1820   RDW 13.0 08/26/2021 1608   LYMPHSABS 2.6 06/14/2022 1820   LYMPHSABS 1.7 06/28/2018 1440   MONOABS 0.5 06/14/2022 1820   EOSABS 0.1 06/14/2022 1820   EOSABS 0.3 06/28/2018 1440   BASOSABS 0.1 06/14/2022 1820   BASOSABS 0.0 06/28/2018 1440    BMET    Component Value Date/Time   NA 141 08/05/2022 1524   NA 140 05/23/2022 1200   K 4.4 08/05/2022 1524   CL 113 (H) 08/05/2022 1524   CO2 17 (L) 08/05/2022 1524   GLUCOSE 138 (H) 08/05/2022 1524   BUN 22 (H) 08/05/2022 1524   BUN 19 05/23/2022 1200   CREATININE 2.14 (H) 08/05/2022 1524   CREATININE 1.68 (H) 04/24/2016 1502   CALCIUM 8.6 (L) 08/05/2022 1524   GFRNONAA 35 (L) 08/05/2022 1524   GFRNONAA >89 12/18/2014 1455   GFRAA 53 (L) 07/17/2020 1705   GFRAA >89 12/18/2014 1455    BNP    Component Value Date/Time   BNP 30.5 07/15/2022 1615   BNP 8.3 04/24/2016 1502    ProBNP    Component Value Date/Time   PROBNP 31.0 07/23/2022 1655    Imaging: No results found.   Assessment & Plan:   Asthma-COPD overlap syndrome - Former smoker. Patient has persistent dyspnea and fatigue symptoms. He experiences sporadic episodes where he needs to gasp for air.  Breathing test showed moderate obstructive lung disease with significant BD response consistent with COPD/asthma overlap. He did not tolerate Trelegy , reported increased chest tightness symptoms. Recommend de-escalating to ICS, can always change to combination inhaler if he tolerates and still has symptoms. Continue Albuterol 2 puffs every 6 hours as needed for breakthrough shortness of breath/wheezing.   Recommendations Start Budesonide (pulmicort) 1-2 puffs daily in the morning and evening (rinse mouth after use) We will get a home sleep study to assess for sleep apnea due to your fatigue symptoms   Follow-up: 4-6 weeks with Dr. Celine Mans   Loud snoring - Patient has symptoms of loud  snoring and excessive daytime fatigue.  Epworth score is 12.  No previous sleep studies.  Moderate suspicion patient may have underlying obstructive sleep apnea, needs home sleep study evaluate.  We reviewed risks of untreated sleep apnea including cardiac arrhythmias, pulmonary hypertension, diabetes and stroke.  Follow-up after sleep study review results and treatment options if needed.   Glenford Bayley, NP 10/28/2022

## 2022-10-28 NOTE — Assessment & Plan Note (Signed)
-   Patient has symptoms of loud snoring and excessive daytime fatigue.  Epworth score is 12.  No previous sleep studies.  Moderate suspicion patient may have underlying obstructive sleep apnea, needs home sleep study evaluate.  We reviewed risks of untreated sleep apnea including cardiac arrhythmias, pulmonary hypertension, diabetes and stroke.  Follow-up after sleep study review results and treatment options if needed.

## 2022-10-28 NOTE — Progress Notes (Signed)
Spoke with patient and heart failure questions reviewed.  Transmission results reviewed.  Pt has been taking Furosemide due to fluid symptoms of weight gain.  He was not able to continue with conversation due to he was in the doctors office.

## 2022-10-28 NOTE — Assessment & Plan Note (Addendum)
-   Former smoker. Patient has persistent dyspnea and fatigue symptoms. He experiences sporadic episodes where he needs to gasp for air.  Breathing test showed moderate obstructive lung disease with significant BD response consistent with COPD/asthma overlap. He did not tolerate Trelegy , reported increased chest tightness symptoms. Recommend de-escalating to ICS, can always change to combination inhaler if he tolerates and still has symptoms. Continue Albuterol 2 puffs every 6 hours as needed for breakthrough shortness of breath/wheezing.   Recommendations Start Budesonide (pulmicort) 1-2 puffs daily in the morning and evening (rinse mouth after use) We will get a home sleep study to assess for sleep apnea due to your fatigue symptoms   Follow-up: 4-6 weeks with Dr. Celine Mans

## 2022-10-28 NOTE — Progress Notes (Signed)
EPIC Encounter for ICM Monitoring  Patient Name: [name] is a 57 y.o. male Date: 10/28/2022 Primary Care Physican: Marcine Matar, MD Primary Cardiologist: Jens Som Electrophysiologist: Lalla Brothers 06/17/2022 Office Weight: 251 lbs 08/04/2022 Weight: 271 lbs 08/12/2022 Weight: 271 lbs 09/22/2022 Weight: 281 lbs                            Attempted call to patient and unable to reach.  Left detailed message per DPR regarding transmission. Transmission reviewed.    [place]vue thoracic impedance suggesting normal fluid levels.   Prescribed dosage:  Furosemide 80 mg take 1 tablet (80 mg total) as needed daily for fluid.    Potassium 20 mEq take 1 tablet by mouth twice a day Spironolactone 25 mg take 1 tablet (0.5 mg total) by mouth once a day.    Labs: 08/05/2022 Creatinine 2.14, BUN 22, Potassium 4.4, Sodium 141, GFR 35 06/16/2022 Creatinine 2.07, BUN 23, Potassium 4.1, Sodium 138, GFR 37  06/15/2022 Creatinine 2.33, BUN 25, Potassium 4.1, Sodium 137, GFR 32  06/14/2022 Creatinine 2.65, BUN 19, Potassium 4.4, Sodium 136, GFR 27  A complete set of results can be found in Results Review.   Recommendations:  Left voice mail with ICM number and encouraged to call if experiencing any fluid symptoms.   Follow-up plan: ICM clinic phone appointment on 12/01/2022.   91 day device clinic remote transmission 11/19/2022.     EP/Cardiology Office Visits:  01/23/2023 with Dr Lalla Brothers.  02/25/2023 with Dr Lalla Brothers.  Recall 02/21/2023 with Dr Gasper Lloyd.   Copy of ICM check sent to Dr Lalla Brothers.   3 month ICM trend: 10/27/2022.    12-14 Month ICM trend:     Karie Soda, RN 10/28/2022 3:46 PM

## 2022-10-28 NOTE — Telephone Encounter (Signed)
Remote ICM transmission received.  Attempted call to patient regarding ICM remote transmission and left detailed message per DPR.  Advised to return call for any fluid symptoms or questions. Next ICM remote transmission scheduled 11/24/2022.    

## 2022-10-29 ENCOUNTER — Encounter (HOSPITAL_COMMUNITY): Payer: Medicaid Other

## 2022-10-29 LAB — LAB REPORT - SCANNED
Creatinine, POC: 146 mg/dL
EGFR: 28

## 2022-10-29 NOTE — Progress Notes (Signed)
[address] [place] [address] Dept: [phone]  FOLLOW UP NOTE  Patient ID: [name], male    DOB: [birth year]  Age: 57 y.o. MRN: [id number] Date of Office Visit: 10/30/2022  Assessment  Chief Complaint: Other (Diagnosed 2 weeks ago with COPD / Has a sleep study appointment later this month for sleep apnea )  HPI [name] is a 57 year old male who presents to the clinic for follow-up visit.  He was last seen in this clinic on 01/10/2022 by Dr. Dellis Anes for evaluation of asthma COPD overlap syndrome, chronic urticaria, allergic rhinitis, and shortness of breath in context of heart failure with preserved ejection fraction.  At today's visit, he reports his allergic rhinitis has been moderately well-controlled with occasional sneezing as the main symptom.  He continues cetirizine 10 mg once a day and is not currently using Flonase or nasal saline rinse.  His last environmental allergy testing was on 06/28/2018 and was positive to grass pollen, tree pollen, dust mite, cat, dog, mold, and cockroach.  He reports that he is not interested in allergen immunotherapy at this time.  Chronic urticaria is reported as well-controlled with no urticarial break outs since his last visit to this clinic.  He continues cetirizine 10 mg once a day and famotidine 20 mg once a day.  He reports that he rarely uses clobetasol for relief of symptoms.  He continues to avoid stinging insects with no insect stings or EpiPen use since his last visit to this clinic.  He reports that he is not interested in venom immunotherapy at this time. EpiPen's are up-to-date.  He continues to follow-up with his pulmonology specialist for treatment of asthma/COPD.  He recently saw Ames Dura, NP, with [facility] on 10/28/2022 for which she was prescribed Pulmicort 90.  He reports that he was unable to tolerate Trelegy 100 and was experiencing increasing chest tightness with use.  He does report some  shortness of breath and chest tightness.  He continues albuterol as needed with relief of symptoms.  He continues to follow up with cardiology for heart failure with reduced ejection fraction.  His current medications are listed in the chart.  Drug Allergies:  Allergies  Allergen Reactions   Bee Venom Anaphylaxis   Trulicity [Dulaglutide] Diarrhea    Extra dose of Trulicity caused cardiac arrest. Patient experience onset of diarrhea with using this medication - cleared after med stopped.    Physical Exam: BP 126/78   Pulse 68   Temp 98.2 F (36.8 C)   Resp 16   Ht 5\' 11"  (1.803 m)   Wt 285 lb 3.2 oz (129.4 kg)   SpO2 95%   BMI 39.78 kg/m    Physical Exam Vitals reviewed.  Constitutional:      Appearance: Normal appearance.  HENT:     Head: Normocephalic and atraumatic.     Right Ear: Tympanic membrane normal.     Left Ear: Tympanic membrane normal.     Nose:     Comments: Bilateral naris edematous and pale with clear nasal drainage noted.  Pharynx normal.  Ears normal.  Eyes normal.    Mouth/Throat:     Pharynx: Oropharynx is clear.  Eyes:     Conjunctiva/sclera: Conjunctivae normal.  Cardiovascular:     Rate and Rhythm: Normal rate and regular rhythm.     Heart sounds: Normal heart sounds. No murmur heard. Pulmonary:     Effort: Pulmonary effort is normal.  Breath sounds: Normal breath sounds.     Comments: Lungs clear to auscultation. Musculoskeletal:        General: Normal range of motion.     Cervical back: Normal range of motion and neck supple.  Skin:    General: Skin is warm and dry.  Neurological:     Mental Status: He is alert and oriented to person, place, and time.  Psychiatric:        Mood and Affect: Mood normal.        Behavior: Behavior normal.        Thought Content: Thought content normal.        Judgment: Judgment normal.     Assessment and Plan: 1. Urticaria   2. Seasonal and perennial allergic rhinoconjunctivitis   3.  Anaphylaxis due to hymenoptera venom, accidental or unintentional, subsequent encounter   4. Shortness of breath     Meds ordered this encounter  Medications   famotidine (PEPCID) 20 MG tablet    Sig: Take 1 tablet by mouth 1-2 times a day as needed.    Dispense:  90 tablet    Refill:  1   cetirizine (EQ ALLERGY RELIEF, CETIRIZINE,) 10 MG tablet    Sig: Take 1 tablet (10 mg total) by mouth daily.    Dispense:  90 tablet    Refill:  1    Patient Instructions  Urticaria Continue Zyrtec (ceterizine) 10mg  up to twice daily as needed.  Continue Pepcid (famotidine) 20mg  up to twice daily as needed.  Add on clobetasol twice daily on the feet (use for two weeks at a time only and then take a break).  Call us with an update after TWO WEEKS!  Avoid the following potential triggers: alcohol, tight clothing, NSAIDs.    Seasonal and perennial allergic rhinoconjunctivitis (dust mites, cat, dog, grass, mold, cockroach, trees) Continue zyrtec 10mg  daily. May use Flonase 1-2 sprays in each nostril once a day as needed for nasal symptoms.  Continue environmental control measures.    Anaphylaxis due to hymenoptera venom Avoid insect stings.  In case of an allergic reaction, take Benadryl 50 mg every 4 hours, and if life-threatening symptoms occur, inject with EpiPen 0.3 mg.   Shortness of breath Monitor symptoms.  Continue to follow-up with your cardiologist and pulmonologist  Call the clinic if this treatment plan is not working well for you  Follow up in 1 year or sooner if needed.   Return in about 1 year (around 10/30/2023), or if symptoms worsen or fail to improve.    Thank you for the opportunity to care for this patient.  Please do not hesitate to contact me with questions.  Thermon Leyland, FNP [facility]

## 2022-10-29 NOTE — Patient Instructions (Incomplete)
Urticaria Continue Zyrtec (ceterizine) 10mg  up to twice daily as needed.  Continue Pepcid (famotidine) 20mg  up to twice daily as needed.  Add on clobetasol twice daily on the feet (use for two weeks at a time only and then take a break).  Call us with an update after TWO WEEKS!  Avoid the following potential triggers: alcohol, tight clothing, NSAIDs.    Seasonal and perennial allergic rhinoconjunctivitis (dust mites, cat, dog, grass, mold, cockroach, trees) Continue zyrtec 10mg  daily. May use Flonase 1-2 sprays in each nostril once a day as needed for nasal symptoms.  Continue environmental control measures.    Anaphylaxis due to hymenoptera venom Avoid insect stings. We will send in an EpiPen refill.   Shortness of breath Monitor symptoms.  Continue to see your CHF doctor.    No follow-ups on file.    Please inform us of any Emergency Department visits, hospitalizations, or changes in symptoms. Call us before going to the ED for breathing or allergy symptoms since we might be able to fit you in for a sick visit. Feel free to contact us anytime with any questions, problems, or concerns.  It was a pleasure to meet you today!  Websites that have reliable patient information: 1. American Academy of Asthma, Allergy, and Immunology: www.aaaai.org 2. Food Allergy Research and Education (FARE): foodallergy.org 3. Mothers of Asthmatics: http://www.asthmacommunitynetwork.org 4. American College of Allergy, Asthma, and Immunology: www.acaai.org

## 2022-10-29 NOTE — Telephone Encounter (Signed)
Patient was seen by Dr. Celine Mans on 10/28/2021

## 2022-10-30 ENCOUNTER — Telehealth: Payer: Self-pay

## 2022-10-30 ENCOUNTER — Other Ambulatory Visit: Payer: Self-pay

## 2022-10-30 ENCOUNTER — Encounter: Payer: Self-pay | Admitting: Family Medicine

## 2022-10-30 ENCOUNTER — Ambulatory Visit (INDEPENDENT_AMBULATORY_CARE_PROVIDER_SITE_OTHER): Payer: Medicaid Other | Admitting: Family Medicine

## 2022-10-30 VITALS — BP 126/78 | HR 68 | Temp 98.2°F | Resp 16 | Ht 71.0 in | Wt 285.2 lb

## 2022-10-30 DIAGNOSIS — R0602 Shortness of breath: Secondary | ICD-10-CM | POA: Diagnosis not present

## 2022-10-30 DIAGNOSIS — J302 Other seasonal allergic rhinitis: Secondary | ICD-10-CM | POA: Diagnosis not present

## 2022-10-30 DIAGNOSIS — T63481D Toxic effect of venom of other arthropod, accidental (unintentional), subsequent encounter: Secondary | ICD-10-CM

## 2022-10-30 DIAGNOSIS — J3089 Other allergic rhinitis: Secondary | ICD-10-CM | POA: Diagnosis not present

## 2022-10-30 DIAGNOSIS — H1013 Acute atopic conjunctivitis, bilateral: Secondary | ICD-10-CM

## 2022-10-30 DIAGNOSIS — T782XXD Anaphylactic shock, unspecified, subsequent encounter: Secondary | ICD-10-CM

## 2022-10-30 DIAGNOSIS — H1010 Acute atopic conjunctivitis, unspecified eye: Secondary | ICD-10-CM

## 2022-10-30 DIAGNOSIS — L509 Urticaria, unspecified: Secondary | ICD-10-CM | POA: Diagnosis not present

## 2022-10-30 MED ORDER — FAMOTIDINE 20 MG PO TABS: ORAL_TABLET | ORAL | 1 refills | Status: DC

## 2022-10-30 MED ORDER — CETIRIZINE HCL 10 MG PO TABS: 10.0000 mg | ORAL_TABLET | Freq: Every day | ORAL | 1 refills | Status: DC

## 2022-10-30 NOTE — Telephone Encounter (Signed)
PA request received via CMM for Pulmicort Flexhaler 90MCG/ACT aerosol powder  PA submitted to [employer] and is pending additional questions/determination  Key: [id number]  *Trelegy causes chest tightness

## 2022-10-31 ENCOUNTER — Telehealth (HOSPITAL_COMMUNITY): Payer: Self-pay

## 2022-10-31 ENCOUNTER — Encounter (HOSPITAL_COMMUNITY): Payer: Medicaid Other

## 2022-10-31 NOTE — Telephone Encounter (Signed)
Called patient to find out when he plans to return to Cardiac Rehab.  Pt voices that he saw the pulmonoligist yesterday and was prescribed new medication that unfortunately were not available for pick up yesterday per patient. He hopes to get those medications today and start them and see how he feels. I asked the patient to call back on Tuesday 11/04/22 to let us know how he is feeling and if he plans to return on Wednesday 11/05/22.   Lorin Picket MS, ACSM-CEP, CCRP

## 2022-11-04 ENCOUNTER — Ambulatory Visit (INDEPENDENT_AMBULATORY_CARE_PROVIDER_SITE_OTHER): Payer: Medicaid Other

## 2022-11-04 DIAGNOSIS — R0683 Snoring: Secondary | ICD-10-CM

## 2022-11-04 DIAGNOSIS — G4733 Obstructive sleep apnea (adult) (pediatric): Secondary | ICD-10-CM

## 2022-11-05 ENCOUNTER — Encounter (HOSPITAL_COMMUNITY): Payer: Self-pay | Admitting: Psychiatry

## 2022-11-05 ENCOUNTER — Encounter (HOSPITAL_COMMUNITY): Payer: Medicaid Other

## 2022-11-05 ENCOUNTER — Ambulatory Visit (HOSPITAL_BASED_OUTPATIENT_CLINIC_OR_DEPARTMENT_OTHER): Payer: Medicaid Other | Admitting: Psychiatry

## 2022-11-05 VITALS — BP 101/66 | HR 77 | Ht 72.0 in | Wt 287.0 lb

## 2022-11-05 DIAGNOSIS — F411 Generalized anxiety disorder: Secondary | ICD-10-CM | POA: Diagnosis not present

## 2022-11-05 DIAGNOSIS — F4310 Post-traumatic stress disorder, unspecified: Secondary | ICD-10-CM | POA: Diagnosis not present

## 2022-11-05 DIAGNOSIS — F410 Panic disorder [episodic paroxysmal anxiety] without agoraphobia: Secondary | ICD-10-CM

## 2022-11-05 MED ORDER — SERTRALINE HCL 25 MG PO TABS
25.0000 mg | ORAL_TABLET | Freq: Every day | ORAL | 2 refills | Status: DC
Start: 2022-11-05 — End: 2023-01-13

## 2022-11-05 MED ORDER — CLONAZEPAM 0.5 MG PO TABS
0.5000 mg | ORAL_TABLET | Freq: Two times a day (BID) | ORAL | 0 refills | Status: DC
Start: 2022-11-18 — End: 2022-11-24

## 2022-11-05 NOTE — Telephone Encounter (Signed)
Patient checking on message for inhaler. Patient phone number is [phone].
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
## 2022-11-07 ENCOUNTER — Encounter (HOSPITAL_COMMUNITY): Payer: Medicaid Other

## 2022-11-10 ENCOUNTER — Ambulatory Visit (HOSPITAL_COMMUNITY): Payer: Medicaid Other

## 2022-11-10 ENCOUNTER — Encounter (HOSPITAL_COMMUNITY): Payer: Medicaid Other

## 2022-11-10 MED ORDER — FLUTICASONE PROPIONATE HFA 44 MCG/ACT IN AERO: 2.0000 | INHALATION_SPRAY | Freq: Two times a day (BID) | RESPIRATORY_TRACT | 12 refills | Status: DC

## 2022-11-10 NOTE — Telephone Encounter (Signed)
I'll send in low dose Flovent HFA- take 2 puffs morning and evening

## 2022-11-10 NOTE — Telephone Encounter (Signed)
Beth can you please advise on message from PA team. Preferred alternatives have been listed below

## 2022-11-10 NOTE — Telephone Encounter (Signed)
PA has been DENIED:  Here are the policy requirements your request did not meet: Per your health plan's criteria, this drug is covered if you meet the following:  One of the following: (1) You have failed one preferred drug as confirmed by claims history or submission of medical records:. The preferred drugs: budesonide suspension 0.25mg , 0.5mg , 1mg  (generic for Pulmicort Respules), fluticasone propionate diskus (generic for Flovent Diskus), fluticasone propionate HFA (generic for Flovent HFA). ( 2) You cannot use one preferred drug (please specify contraindication or intolerance).  The information provided does not show that you meet the criteria listed above.  Please speak with your doctor about your choices. This decision was made per the [employer] [place] Non-Preferred Drugs Guideline.

## 2022-11-10 NOTE — Telephone Encounter (Signed)
Spoke with patient advised Flovent has been sent to pharmacy, since insurance doesn't want to cover Pulmicort at this time. Advised he can call the office back if inhaler doesn't work well for him. Patient verbalized understanding. NFN

## 2022-11-11 ENCOUNTER — Ambulatory Visit: Payer: Medicaid Other | Admitting: Primary Care

## 2022-11-11 ENCOUNTER — Telehealth (HOSPITAL_COMMUNITY): Payer: Self-pay

## 2022-11-11 NOTE — Addendum Note (Signed)
Encounter addended by: Lorin Picket on: 11/11/2022 11:36 AM  Actions taken: Pend clinical note

## 2022-11-11 NOTE — Addendum Note (Signed)
Encounter addended by: Lorin Picket on: 11/11/2022 11:34 AM  Actions taken: Flowsheet accepted

## 2022-11-11 NOTE — Addendum Note (Signed)
Encounter addended by: [name], RN on: 11/11/2022 4:36 PM  Actions taken: Flowsheet data copied forward, Flowsheet accepted

## 2022-11-11 NOTE — Progress Notes (Signed)
Cardiac Individual Treatment Plan  Patient Details  Name: RONDA CHIARELLA MRN: [id number] Date of Birth: 1966-01-26 Referring Provider:   Flowsheet Row CARDIAC REHAB PHASE II ORIENTATION from 09/25/2022 in [facility]  Referring Provider Olga Millers, MD       Initial Encounter Date:  Flowsheet Row CARDIAC REHAB PHASE II ORIENTATION from 09/25/2022 in [facility]  Date 09/25/22       Visit Diagnosis: Heart failure, chronic systolic (HCC)  Patient's Home Medications on Admission:  Current Outpatient Medications:    Accu-Chek Softclix Lancets lancets, Use as instructed, Disp: 100 each, Rfl: 12   acetaminophen (TYLENOL) 500 MG tablet, Take 1,000 mg by mouth 2 (two) times daily., Disp: , Rfl:    albuterol (VENTOLIN HFA) 108 (90 Base) MCG/ACT inhaler, Inhale 2 puffs into the lungs every 6 (six) hours as needed for wheezing or shortness of breath., Disp: 18 g, Rfl: 2   amiodarone (PACERONE) 200 MG tablet, Take 1 tablet (200 mg total) by mouth daily., Disp: 90 tablet, Rfl: 3   aspirin 81 MG tablet, Take 81 mg by mouth daily., Disp: , Rfl:    atorvastatin (LIPITOR) 10 MG tablet, Take 1 tablet (10 mg total) by mouth daily., Disp: 90 tablet, Rfl: 1   benzonatate (TESSALON) 100 MG capsule, Take 1 capsule (100 mg total) by mouth 2 (two) times daily as needed for cough., Disp: 20 capsule, Rfl: 0   Blood Glucose Monitoring Suppl (ACCU-CHEK GUIDE) w/Device KIT, UAD, Disp: 1 kit, Rfl: 0   cetirizine (EQ ALLERGY RELIEF, CETIRIZINE,) 10 MG tablet, Take 1 tablet (10 mg total) by mouth daily., Disp: 90 tablet, Rfl: 1   cholecalciferol (VITAMIN D3) 25 MCG (1000 UNIT) tablet, Take 1,000 Units by mouth daily., Disp: , Rfl:    clobetasol cream (TEMOVATE) 0.05 %, Apply 1 Application topically as needed., Disp: , Rfl:    [START ON 11/18/2022] clonazePAM (KLONOPIN) 0.5 MG tablet, Take 1 tablet  (0.5 mg total) by mouth 2 (two) times daily., Disp: 60 tablet, Rfl: 0   Continuous Blood Gluc Sensor (FREESTYLE LIBRE 2 SENSOR) MISC, Use to check blood sugar TID. Change sensor once every 14 days. E11.65, Disp: 2 each, Rfl: 11   dapagliflozin propanediol (FARXIGA) 10 MG TABS tablet, Take 1 tablet (10 mg total) by mouth daily before breakfast., Disp: 90 tablet, Rfl: 3   EPINEPHrine 0.3 mg/0.3 mL IJ SOAJ injection, Inject 0.3 mg into the muscle as needed., Disp: 1 each, Rfl: 1   famotidine (PEPCID) 20 MG tablet, Take 1 tablet by mouth 1-2 times a day as needed., Disp: 90 tablet, Rfl: 1   fluticasone (FLONASE) 50 MCG/ACT nasal spray, Use 1-2 sprays in each nostril once a day as needed for nasal congestion., Disp: 16 g, Rfl: 5   fluticasone (FLOVENT HFA) 44 MCG/ACT inhaler, Inhale 2 puffs into the lungs in the morning and at bedtime., Disp: 1 each, Rfl: 12   furosemide (LASIX) 80 MG tablet, Take 1 tablet (80 mg total) by mouth daily as needed for edema or fluid., Disp: 180 tablet, Rfl: 1   gabapentin (NEURONTIN) 300 MG capsule, Take 1 capsule by mouth at bedtime, Disp: 90 capsule, Rfl: 1   glucose blood (ACCU-CHEK GUIDE) test strip, Use as instructed, Disp: 100 each, Rfl: 12   glucose blood (FREESTYLE LITE) test strip, Use as instructed.  Please make sure this is compatible with [facility] 2 reader.,  Disp: 100 each, Rfl: 12   insulin aspart (NOVOLOG FLEXPEN) 100 UNIT/ML FlexPen, Inject 11 Units into the skin 2 (two) times daily before a meal., Disp: 15 mL, Rfl: 2   insulin glargine (LANTUS SOLOSTAR) 100 UNIT/ML Solostar Pen, Inject 32 Units into the skin daily., Disp: 15 mL, Rfl: 1   Insulin Pen Needle (PEN NEEDLES) 31G X 8 MM MISC, UAD, Disp: 100 each, Rfl: 6   ipratropium-albuterol (DUONEB) 0.5-2.5 (3) MG/3ML SOLN, Take 3 mLs by nebulization every 6 (six) hours as needed., Disp: 360 mL, Rfl: 2   Lancet Devices (ACCU-CHEK SOFTCLIX) lancets, 1 each by Other route 3 (three) times daily., Disp: 1 each, Rfl: 0    methocarbamol (ROBAXIN) 500 MG tablet, TAKE 1 TABLET BY MOUTH ONCE DAILY AS NEEDED FOR MUSCLE SPASM, Disp: 30 tablet, Rfl: 2   metoprolol succinate (TOPROL-XL) 50 MG 24 hr tablet, Take 1 tablet (50 mg total) by mouth daily., Disp: 90 tablet, Rfl: 3   predniSONE (DELTASONE) 10 MG tablet, 4 tabs for 2 days, then 3 tabs for 2 days, 2 tabs for 2 days, then 1 tab for 2 days, then stop, Disp: 20 tablet, Rfl: 0   sacubitril-valsartan (ENTRESTO) 24-26 MG, Take 2 tablets by mouth 2 (two) times daily. Disregard previous refill requests. This is correct sig - 2 tablets twice daily, Disp: 60 tablet, Rfl: 6   sertraline (ZOLOFT) 25 MG tablet, Take 1 tablet (25 mg total) by mouth daily., Disp: 30 tablet, Rfl: 2   sildenafil (VIAGRA) 100 MG tablet, Take 1-2 tabs PO 1/2-1 hr prior to intercourse., Disp: 20 tablet, Rfl: 4   sodium chloride (OCEAN) 0.65 % SOLN nasal spray, Place 1 spray into both nostrils as needed for congestion., Disp: 104 mL, Rfl: 0   spironolactone (ALDACTONE) 25 MG tablet, Take 0.5 tablets (12.5 mg total) by mouth daily., Disp: 30 tablet, Rfl: 5   tamsulosin (FLOMAX) 0.4 MG CAPS capsule, Take 2 capsules (0.8 mg total) by mouth daily., Disp: 180 capsule, Rfl: 1   triamcinolone cream (KENALOG) 0.1 %, Apply sparingly to red itchy areas twice daily as needed. Avoid face, neck, armpits or groin area. Do not use more than 3 weeks in a row., Disp: 45 g, Rfl: 5  Past Medical History: Past Medical History:  Diagnosis Date   AICD (automatic cardioverter/defibrillator) present    Chronic bronchitis (HCC)    "get it most q yr" (12/23/2013)   Chronic systolic (congestive) heart failure (HCC)    Fracture of left humerus    a. 07/2013.   GERD (gastroesophageal reflux disease)    not at present time   Heart murmur    "born w/it"    History of renal calculi    Hypertension    Kidney stones    NICM (nonischemic cardiomyopathy) (HCC)    a. 07/2013 Echo: EF 20-25%, diff HK, Gr2 DD, mild MR, mod dil LA/RA.  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    Shockable heart rhythm detected by automated external defibrillator    Syncope    Type II diabetes mellitus (HCC) 2006    Tobacco Use: Social History   Tobacco Use  Smoking Status Former   Packs/day: 1.00   Years: 28.00   Additional pack years: 0.00   Total pack years: 28.00   Types: Cigarettes   Quit date: 06/03/2014   Years since quitting: 8.4  Smokeless Tobacco Never  Labs: Review Flowsheet  More data exists      Latest Ref Rng & Units 02/01/2022 05/03/2022 05/04/2022 05/12/2022 09/18/2022  Labs for ITP Cardiac and Pulmonary Rehab  Hemoglobin A1c 4.0 - 5.6 % - - 10.2  - 7.9   Bicarbonate 20.0 - 28.0 mmol/L 20.0 - 28.0 mmol/L 27.7  - - 20.7  19.9  -  TCO2 22 - 32 mmol/L 22 - 32 mmol/L 29  22  - 22  21  -  Acid-base deficit 0.0 - 2.0 mmol/L 0.0 - 2.0 mmol/L - - - 4.0  5.0  -  O2 Saturation % % 87  - - 63  63  -    Capillary Blood Glucose: Lab Results  Component Value Date   GLUCAP 209 (H) 10/03/2022   GLUCAP 217 (H) 10/03/2022   GLUCAP 148 (H) 06/16/2022   GLUCAP 174 (H) 06/16/2022   GLUCAP 184 (H) 06/15/2022     Exercise Target Goals: Exercise Program Goal: Individual exercise prescription set using results from initial 6 min walk test and THRR while considering  patient's activity barriers and safety.   Exercise Prescription Goal: Initial exercise prescription builds to 30-45 minutes a day of aerobic activity, 2-3 days per week.  Home exercise guidelines will be given to patient during program as part of exercise prescription that the participant will acknowledge.  Activity Barriers & Risk Stratification:  Activity Barriers & Cardiac Risk Stratification - 09/25/22 1609       Activity Barriers & Cardiac Risk Stratification   Activity Barriers Joint Problems;Back Problems;Shortness of Breath;Decreased Ventricular Function    Cardiac  Risk Stratification High   <5METs on            6 Minute Walk:  6 Minute Walk     Row Name 09/25/22 1608         6 Minute Walk   Phase Initial     Distance 1109 feet     Walk Time 6 minutes     # of Rest Breaks 0     MPH 2.1     METS 2.4     RPE 12     Perceived Dyspnea  1     VO2 Peak 8.32     Symptoms Yes (comment)     Comments 4/10 chronic low back pain, SOB. SOB resolved with rest     Resting HR 62 bpm     Resting BP 114/76     Resting Oxygen Saturation  97 %     Exercise Oxygen Saturation  during 6 min walk 97 %     Max Ex. HR 80 bpm     Max Ex. BP 112/68     2 Minute Post BP 118/80              Oxygen Initial Assessment:   Oxygen Re-Evaluation:   Oxygen Discharge (Final Oxygen Re-Evaluation):   Initial Exercise Prescription:  Initial Exercise Prescription - 09/25/22 1600       Date of Initial Exercise RX and Referring Provider   Date 09/25/22    Referring Provider Olga Millers, MD    Expected Discharge Date 11/07/22      Treadmill   MPH 1.7    Grade 0    Minutes 15    METs 2      NuStep   Level 1    SPM 75    Minutes 15    METs 2      Prescription Details  Frequency (times per week) 3    Duration Progress to 30 minutes of continuous aerobic without signs/symptoms of physical distress      Intensity   THRR 40-80% of Max Heartrate 65-130    Ratings of Perceived Exertion 11-13    Perceived Dyspnea 0-4      Progression   Progression Continue progressive overload as per policy without signs/symptoms or physical distress.      Resistance Training   Training Prescription Yes    Weight 2    Reps 10-15             Perform Capillary Blood Glucose checks as needed.  Exercise Prescription Changes:   Exercise Prescription Changes     Row Name 10/03/22 1503             Response to Exercise   Blood Pressure (Admit) 108/70       Blood Pressure (Exercise) 176/80       Blood Pressure (Exit) 115/76       Heart  Rate (Admit) 75 bpm       Heart Rate (Exercise) 91 bpm       Heart Rate (Exit) 63 bpm       Rating of Perceived Exertion (Exercise) 11       Symptoms Pt lightheaded after using treadmill       Comments Pt's first day in the [facility] program       Duration Continue with 30 min of aerobic exercise without signs/symptoms of physical distress.       Intensity THRR unchanged         Progression   Progression Continue to progress workloads to maintain intensity without signs/symptoms of physical distress.       Average METs 2.3         Resistance Training   Training Prescription No       Weight Weights held due to being lightheaded         Interval Training   Interval Training No         Treadmill   MPH 1.7       Grade 0       Minutes 15       METs 2.3         NuStep   Level --  Not done due to being lightheaded                Exercise Comments:   Exercise Comments     Row Name 10/03/22 1506 10/22/22 1500 11/03/22 1500 11/11/22 1126     Exercise Comments Pt's first day in the [facility] program. Pt became lightheaded after using the treamdill. Will remove pt from treadmill at this time and utilize a non-weight bearing modality. Pt due for MET review but is on medical hold. will complete when patient returns. Pt due for MET and goal review. Pt on medical hold. Will complete upon pt's return. Called patient today regarding absence. Pt voices that he plans to retun to the [facility] on Friday, 11/14/2022. Pt is aware if he does not return on Friday he will be discharged from the program.             Exercise Goals and Review:   Exercise Goals     Row Name 09/25/22 1352             Exercise Goals   Increase Physical Activity Yes       Intervention Provide advice, education, support and counseling about physical activity/exercise  needs.;Develop an individualized exercise prescription for aerobic and resistive training based on initial evaluation findings, risk stratification,  comorbidities and participant's personal goals.       Expected Outcomes Short Term: Attend rehab on a regular basis to increase amount of physical activity.;Long Term: Exercising regularly at least 3-5 days a week.;Long Term: Add in home exercise to make exercise part of routine and to increase amount of physical activity.       Increase Strength and Stamina Yes       Intervention Provide advice, education, support and counseling about physical activity/exercise needs.;Develop an individualized exercise prescription for aerobic and resistive training based on initial evaluation findings, risk stratification, comorbidities and participant's personal goals.       Expected Outcomes Short Term: Increase workloads from initial exercise prescription for resistance, speed, and METs.;Short Term: Perform resistance training exercises routinely during rehab and add in resistance training at home;Long Term: Improve cardiorespiratory fitness, muscular endurance and strength as measured by increased METs and functional capacity ( )       Able to understand and use rate of perceived exertion (RPE) scale Yes       Intervention Provide education and explanation on how to use RPE scale       Expected Outcomes Short Term: Able to use RPE daily in rehab to express subjective intensity level;Long Term:  Able to use RPE to guide intensity level when exercising independently       Knowledge and understanding of Target Heart Rate Range (THRR) Yes       Intervention Provide education and explanation of THRR including how the numbers were predicted and where they are located for reference       Expected Outcomes Short Term: Able to state/look up THRR;Short Term: Able to use daily as guideline for intensity in rehab;Long Term: Able to use THRR to govern intensity when exercising independently       Understanding of Exercise Prescription Yes       Intervention Provide education, explanation, and written materials on patient's  individual exercise prescription       Expected Outcomes Short Term: Able to explain program exercise prescription;Long Term: Able to explain home exercise prescription to exercise independently                Exercise Goals Re-Evaluation :  Exercise Goals Re-Evaluation     Row Name 10/03/22 1505             Exercise Goal Re-Evaluation   Exercise Goals Review Increase Physical Activity;Increase Strength and Stamina;Able to understand and use rate of perceived exertion (RPE) scale;Knowledge and understanding of Target Heart Rate Range (THRR);Understanding of Exercise Prescription       Comments Pt's first day in the [facility] program. Pt understnads the exercise RX, RPE sclae and THRR,       Expected Outcomes Will continue to montior patient and progress exercise workloads as tolerated.                Discharge Exercise Prescription (Final Exercise Prescription Changes):  Exercise Prescription Changes - 10/03/22 1503       Response to Exercise   Blood Pressure (Admit) 108/70    Blood Pressure (Exercise) 176/80    Blood Pressure (Exit) 115/76    Heart Rate (Admit) 75 bpm    Heart Rate (Exercise) 91 bpm    Heart Rate (Exit) 63 bpm    Rating of Perceived Exertion (Exercise) 11    Symptoms Pt lightheaded after using treadmill  Comments Pt's first day in the [facility] program    Duration Continue with 30 min of aerobic exercise without signs/symptoms of physical distress.    Intensity THRR unchanged      Progression   Progression Continue to progress workloads to maintain intensity without signs/symptoms of physical distress.    Average METs 2.3      Resistance Training   Training Prescription No    Weight Weights held due to being lightheaded      Interval Training   Interval Training No      Treadmill   MPH 1.7    Grade 0    Minutes 15    METs 2.3      NuStep   Level --   Not done due to being lightheaded            Nutrition:  Target Goals: Understanding  of nutrition guidelines, daily intake of sodium 1500mg , cholesterol 200mg , calories 30% from fat and 7% or less from saturated fats, daily to have 5 or more servings of fruits and vegetables.  Biometrics:  Pre Biometrics - 09/25/22 1323       Pre Biometrics   Waist Circumference 50.5 inches    Hip Circumference 47.75 inches    Waist to Hip Ratio 1.06 %    Triceps Skinfold 30 mm    % Body Fat 37.9 %    Grip Strength 30 kg    Flexibility 0 in   no done due to low back pain   Single Leg Stand 30 seconds              Nutrition Therapy Plan and Nutrition Goals:  Nutrition Therapy & Goals - 10/31/22 1341       Nutrition Therapy   Diet Heart Healthy Diet    Drug/Food Interactions Statins/Certain Fruits      Personal Nutrition Goals   Nutrition Goal Patient to identify strategies for reducing cardiovascular risk by attending the [facility] education and nutrition series weekly.    Personal Goal #2 Patient to improve diet quality by using the plate method as a guide for meal planning to include lean protein/plant protein, fruits, vegetables, whole grains, nonfat dairy as part of a well-balanced diet.    Personal Goal #3 Patient to reduce sodium to 1500mg  per day.    Personal Goal #4 Patient to identify strategies for weight loss of 0.5-2.0# per week.    Comments Zandyn has not attended intensive cardiac rehab since 10/15/2022. Will follow-up on nutrition goals upon patient's return. Odinn will benefit from from participation in intensive cardiac rehab for nutrition, exercise, and lifestyle modification.      Intervention Plan   Intervention Prescribe, educate and counsel regarding individualized specific dietary modifications aiming towards targeted core components such as weight, hypertension, lipid management, diabetes, heart failure and other comorbidities.;Nutrition handout(s) given to patient.    Expected Outcomes Short Term Goal: Understand basic principles of dietary content, such  as calories, fat, sodium, cholesterol and nutrients.;Long Term Goal: Adherence to prescribed nutrition plan.             Nutrition Assessments:  MEDIFICTS Score Key: ?70 Need to make dietary changes  40-70 Heart Healthy Diet ? 40 Therapeutic Level Cholesterol Diet    Picture Your Plate Scores: <41 Unhealthy dietary pattern with much room for improvement. 41-50 Dietary pattern unlikely to meet recommendations for good health and room for improvement. 51-60 More healthful dietary pattern, with some room for improvement.  >60 Healthy dietary pattern, although  there may be some specific behaviors that could be improved.    Nutrition Goals Re-Evaluation:  Nutrition Goals Re-Evaluation     Row Name 10/03/22 1545 10/31/22 1341           Goals   Current Weight 280 lb 10.3 oz (127.3 kg) --      Comment A1c 7.9, Cr 2.14, GFR 35 A1c 7.9, Cr 2.14, GFR 35      Expected Outcome Nehamiah is up ~15# per documentation since fall of 2023. He is motivated to lose weight. He reports eating 1-2x per day. He continues regular follow-up with the heart failure clinic.  Leaf will benefit from from participation in intensive cardiac rehab for nutrition, exercise, and lifestyle modification. Kaile has not attended intensive cardiac rehab since 10/15/2022. Will follow-up on nutrition goals upon patient's return. Becker will benefit from from participation in intensive cardiac rehab for nutrition, exercise, and lifestyle modification.               Nutrition Goals Re-Evaluation:  Nutrition Goals Re-Evaluation     Row Name 10/03/22 1545 10/31/22 1341           Goals   Current Weight 280 lb 10.3 oz (127.3 kg) --      Comment A1c 7.9, Cr 2.14, GFR 35 A1c 7.9, Cr 2.14, GFR 35      Expected Outcome Ival is up ~15# per documentation since fall of 2023. He is motivated to lose weight. He reports eating 1-2x per day. He continues regular follow-up with the heart failure clinic.  Argenis will benefit from  from participation in intensive cardiac rehab for nutrition, exercise, and lifestyle modification. Eliecer has not attended intensive cardiac rehab since 10/15/2022. Will follow-up on nutrition goals upon patient's return. Leonidus will benefit from from participation in intensive cardiac rehab for nutrition, exercise, and lifestyle modification.               Nutrition Goals Discharge (Final Nutrition Goals Re-Evaluation):  Nutrition Goals Re-Evaluation - 10/31/22 1341       Goals   Comment A1c 7.9, Cr 2.14, GFR 35    Expected Outcome Andrew has not attended intensive cardiac rehab since 10/15/2022. Will follow-up on nutrition goals upon patient's return. Shalev will benefit from from participation in intensive cardiac rehab for nutrition, exercise, and lifestyle modification.             Psychosocial: Target Goals: Acknowledge presence or absence of significant depression and/or stress, maximize coping skills, provide positive support system. Participant is able to verbalize types and ability to use techniques and skills needed for reducing stress and depression.  Initial Review & Psychosocial Screening:  Initial Psych Review & Screening - 09/25/22 1621       Initial Review   Current issues with Current Depression;Current Anxiety/Panic;Current Psychotropic Meds;Current Sleep Concerns      Family Dynamics   Good Support System? Yes   Cordarrell has his brother and sister for support   Comments Canden shared that he has had some PTSD, anxiety, and trouble sleeping since his ICD fired in November. He is currently on Klonopin for his anxiety and feels it is helping. He has previously tried other medications for depression but did not tolerate them well. He is actively talking with his PCP about adjusting his medications and finding a regime that works for him. Sohil is also starting counseling with a pyschiatrist Monday. Emotional support and resources offered, Vic feels good about his current plan.  Barriers   Psychosocial barriers to participate in program The patient should benefit from training in stress management and relaxation.      Screening Interventions   Interventions Encouraged to exercise;To provide support and resources with identified psychosocial needs;Provide feedback about the scores to participant    Expected Outcomes Short Term goal: Utilizing psychosocial counselor, staff and physician to assist with identification of specific Stressors or current issues interfering with healing process. Setting desired goal for each stressor or current issue identified.;Long Term Goal: Stressors or current issues are controlled or eliminated.;Short Term goal: Identification and review with participant of any Quality of Life or Depression concerns found by scoring the questionnaire.;Long Term goal: The participant improves quality of Life and PHQ9 Scores as seen by post scores and/or verbalization of changes             Quality of Life Scores:  Quality of Life - 09/25/22 1627       Quality of Life   Select Quality of Life      Quality of Life Scores   Health/Function Pre 16.3 %    Socioeconomic Pre 18.5 %    Psych/Spiritual Pre 16.21 %    Family Pre 20 %    GLOBAL Pre 17.06 %            Scores of 19 and below usually indicate a poorer quality of life in these areas.  A difference of  2-3 points is a clinically meaningful difference.  A difference of 2-3 points in the total score of the Quality of Life Index has been associated with significant improvement in overall quality of life, self-image, physical symptoms, and general health in studies assessing change in quality of life.  PHQ-9: Review Flowsheet  More data exists      09/25/2022 07/14/2022 07/10/2022 06/17/2022 03/04/2022  Depression screen PHQ 2/9  Decreased Interest 2 1  1 1   Down, Depressed, Hopeless 1 1  2 1   PHQ - 2 Score 3 2  3 2   Altered sleeping 2 1  1 1   Tired, decreased energy 1 1  0 1  Change in  appetite 1 0  0 1  Feeling bad or failure about yourself  0 0  0 0  Trouble concentrating 1 1  0 0  Moving slowly or fidgety/restless 0 0  0 0  Suicidal thoughts 0 0  1 0  PHQ-9 Score 8 5  5 5   Difficult doing work/chores Somewhat difficult - - - -    Details       Information is confidential and restricted. Go to Review Flowsheets to unlock data.        Interpretation of Total Score  Total Score Depression Severity:  1-4 = Minimal depression, 5-9 = Mild depression, 10-14 = Moderate depression, 15-19 = Moderately severe depression, 20-27 = Severe depression   Psychosocial Evaluation and Intervention:   Psychosocial Re-Evaluation:  Psychosocial Re-Evaluation     Row Name 10/14/22 1705             Psychosocial Re-Evaluation   Current issues with Current Psychotropic Meds;Current Sleep Concerns;Current Stress Concerns;Current Depression       Comments Reviewed quality of life. Jaspar is currently on medication for anxiety and depression. Gabriell is receiving counselling. Zoel says his anxiety medicaiton is controlling his anxiety and depression. Jaythan says cardiac rehab has been helpful for him so far.       Expected Outcomes Schyler will have controlled or decreased depression upon completion  of intensive cardiac rehab       Interventions Stress management education;Encouraged to attend Cardiac Rehabilitation for the exercise;Relaxation education       Continue Psychosocial Services  Follow up required by staff         Initial Review   Source of Stress Concerns Chronic Illness;Unable to participate in former interests or hobbies;Unable to perform yard/household activities                Psychosocial Discharge (Final Psychosocial Re-Evaluation):  Psychosocial Re-Evaluation - 10/14/22 1705       Psychosocial Re-Evaluation   Current issues with Current Psychotropic Meds;Current Sleep Concerns;Current Stress Concerns;Current Depression    Comments Reviewed quality of life.  Tiran is currently on medication for anxiety and depression. Rickey is receiving counselling. [place] says his anxiety medicaiton is controlling his anxiety and depression. Jerral says cardiac rehab has been helpful for him so far.    Expected Outcomes Adwin will have controlled or decreased depression upon completion of intensive cardiac rehab    Interventions Stress management education;Encouraged to attend Cardiac Rehabilitation for the exercise;Relaxation education    Continue Psychosocial Services  Follow up required by staff      Initial Review   Source of Stress Concerns Chronic Illness;Unable to participate in former interests or hobbies;Unable to perform yard/household activities             Vocational Rehabilitation: Provide vocational rehab assistance to qualifying candidates.   Vocational Rehab Evaluation & Intervention:  Vocational Rehab - 09/25/22 1628       Initial Vocational Rehab Evaluation & Intervention   Assessment shows need for Vocational Rehabilitation No   Dack is on disability and stated he is unable to return to [employer]            Education: Education Goals: Education classes will be provided on a weekly basis, covering required topics. Participant will state understanding/return demonstration of topics presented.    Education     Row Name 10/10/22 1300     Education   Cardiac Education Topics Pritikin   Licensed conveyancer Nutrition   Nutrition Vitamins and Minerals   Instruction Review Code 1- Verbalizes Understanding            Core Videos: Exercise    Move It!  Clinical staff conducted group or individual video education with verbal and written material and guidebook.  Patient learns the recommended Pritikin exercise program. Exercise with the goal of living a long, healthy life. Some of the health benefits of exercise include controlled diabetes, healthier blood pressure levels, improved  cholesterol levels, improved heart and lung capacity, improved sleep, and better body composition. Everyone should speak with their doctor before starting or changing an exercise routine.  Biomechanical Limitations Clinical staff conducted group or individual video education with verbal and written material and guidebook.  Patient learns how biomechanical limitations can impact exercise and how we can mitigate and possibly overcome limitations to have an impactful and balanced exercise routine.  Body Composition Clinical staff conducted group or individual video education with verbal and written material and guidebook.  Patient learns that body composition (ratio of muscle mass to fat mass) is a key component to assessing overall fitness, rather than body weight alone. Increased fat mass, especially visceral belly fat, can put [place] at increased risk for metabolic syndrome, type 2 diabetes, heart disease, and even death. It is recommended to combine  diet and exercise (cardiovascular and resistance training) to improve your body composition. Seek guidance from your physician and exercise physiologist before implementing an exercise routine.  Exercise Action Plan Clinical staff conducted group or individual video education with verbal and written material and guidebook.  Patient learns the recommended strategies to achieve and enjoy long-term exercise adherence, including variety, self-motivation, self-efficacy, and positive decision making. Benefits of exercise include fitness, good health, weight management, more energy, better sleep, less stress, and overall well-being.  Medical   Heart Disease Risk Reduction Clinical staff conducted group or individual video education with verbal and written material and guidebook.  Patient learns our heart is our most vital organ as it circulates oxygen, nutrients, white blood cells, and hormones throughout the entire body, and carries waste away. Data supports a  plant-based eating plan like the Pritikin Program for its effectiveness in slowing progression of and reversing heart disease. The video provides a number of recommendations to address heart disease.   Metabolic Syndrome and Belly Fat  Clinical staff conducted group or individual video education with verbal and written material and guidebook.  Patient learns what metabolic syndrome is, how it leads to heart disease, and how one can reverse it and keep it from coming back. You have metabolic syndrome if you have 3 of the following 5 criteria: abdominal obesity, high blood pressure, high triglycerides, low HDL cholesterol, and high blood sugar.  Hypertension and Heart Disease Clinical staff conducted group or individual video education with verbal and written material and guidebook.  Patient learns that high blood pressure, or hypertension, is very common in the [place]. Hypertension is largely due to excessive salt intake, but other important risk factors include being overweight, physical inactivity, drinking too much alcohol, smoking, and not eating enough potassium from fruits and vegetables. High blood pressure is a leading risk factor for heart attack, stroke, congestive heart failure, dementia, kidney failure, and premature death. Long-term effects of excessive salt intake include stiffening of the arteries and thickening of heart muscle and organ damage. Recommendations include ways to reduce hypertension and the risk of heart disease.  Diseases of Our Time - Focusing on Diabetes Clinical staff conducted group or individual video education with verbal and written material and guidebook.  Patient learns why the best way to stop diseases of our time is prevention, through food and other lifestyle changes. Medicine (such as prescription pills and surgeries) is often only a Band-Aid on the problem, not a long-term solution. Most common diseases of our time include obesity, type 2 diabetes,  hypertension, heart disease, and cancer. The Pritikin Program is recommended and has been proven to help reduce, reverse, and/or prevent the damaging effects of metabolic syndrome.  Nutrition   Overview of the Pritikin Eating Plan  Clinical staff conducted group or individual video education with verbal and written material and guidebook.  Patient learns about the Pritikin Eating Plan for disease risk reduction. The Pritikin Eating Plan emphasizes a wide variety of unrefined, minimally-processed carbohydrates, like fruits, vegetables, whole grains, and legumes. Go, Caution, and Stop food choices are explained. Plant-based and lean animal proteins are emphasized. Rationale provided for low sodium intake for blood pressure control, low added sugars for blood sugar stabilization, and low added fats and oils for coronary artery disease risk reduction and weight management.  Calorie Density  Clinical staff conducted group or individual video education with verbal and written material and guidebook.  Patient learns about calorie density and how it impacts the Pritikin Eating  Plan. Knowing the characteristics of the food you choose will help you decide whether those foods will lead to weight gain or weight loss, and whether you want to consume more or less of them. Weight loss is usually a side effect of the Pritikin Eating Plan because of its focus on low calorie-dense foods.  Label Reading  Clinical staff conducted group or individual video education with verbal and written material and guidebook.  Patient learns about the Pritikin recommended label reading guidelines and corresponding recommendations regarding calorie density, added sugars, sodium content, and whole grains.  Dining Out - Part 1  Clinical staff conducted group or individual video education with verbal and written material and guidebook.  Patient learns that restaurant meals can be sabotaging because they can be so high in calories, fat,  sodium, and/or sugar. Patient learns recommended strategies on how to positively address this and avoid unhealthy pitfalls.  Facts on Fats  Clinical staff conducted group or individual video education with verbal and written material and guidebook.  Patient learns that lifestyle modifications can be just as effective, if not more so, as many medications for lowering your risk of heart disease. A Pritikin lifestyle can help to reduce your risk of inflammation and atherosclerosis (cholesterol build-up, or plaque, in the artery walls). Lifestyle interventions such as dietary choices and physical activity address the cause of atherosclerosis. A review of the types of fats and their impact on blood cholesterol levels, along with dietary recommendations to reduce fat intake is also included.  Nutrition Action Plan  Clinical staff conducted group or individual video education with verbal and written material and guidebook.  Patient learns how to incorporate Pritikin recommendations into their lifestyle. Recommendations include planning and keeping personal health goals in mind as an important part of their success.  Healthy Mind-Set    Healthy Minds, Bodies, Hearts  Clinical staff conducted group or individual video education with verbal and written material and guidebook.  Patient learns how to identify when they are stressed. Video will discuss the impact of that stress, as well as the many benefits of stress management. Patient will also be introduced to stress management techniques. The way we think, act, and feel has an impact on our hearts.  How Our Thoughts Can Heal Our Hearts  Clinical staff conducted group or individual video education with verbal and written material and guidebook.  Patient learns that negative thoughts can cause depression and anxiety. This can result in negative lifestyle behavior and serious health problems. Cognitive behavioral therapy is an effective method to help control  our thoughts in order to change and improve our emotional outlook.  Additional Videos:  Exercise    Improving Performance  Clinical staff conducted group or individual video education with verbal and written material and guidebook.  Patient learns to use a non-linear approach by alternating intensity levels and lengths of time spent exercising to help burn more calories and lose more body fat. Cardiovascular exercise helps improve heart health, metabolism, hormonal balance, blood sugar control, and recovery from fatigue. Resistance training improves strength, endurance, balance, coordination, reaction time, metabolism, and muscle mass. Flexibility exercise improves circulation, posture, and balance. Seek guidance from your physician and exercise physiologist before implementing an exercise routine and learn your capabilities and proper form for all exercise.  Introduction to Yoga  Clinical staff conducted group or individual video education with verbal and written material and guidebook.  Patient learns about yoga, a discipline of the coming together of mind, breath, and body.  The benefits of yoga include improved flexibility, improved range of motion, better posture and core strength, increased lung function, weight loss, and positive self-image. Yoga's heart health benefits include lowered blood pressure, healthier heart rate, decreased cholesterol and triglyceride levels, improved immune function, and reduced stress. Seek guidance from your physician and exercise physiologist before implementing an exercise routine and learn your capabilities and proper form for all exercise.  Medical   Aging: Enhancing Your Quality of Life  Clinical staff conducted group or individual video education with verbal and written material and guidebook.  Patient learns key strategies and recommendations to stay in good physical health and enhance quality of life, such as prevention strategies, having an advocate,  securing a Health Care Proxy and Power of Attorney, and keeping a list of medications and system for tracking them. It also discusses how to avoid risk for bone loss.  Biology of Weight Control  Clinical staff conducted group or individual video education with verbal and written material and guidebook.  Patient learns that weight gain occurs because we consume more calories than we burn (eating more, moving less). Even if your body weight is normal, you may have higher ratios of fat compared to muscle mass. Too much body fat puts you at increased risk for cardiovascular disease, heart attack, stroke, type 2 diabetes, and obesity-related cancers. In addition to exercise, following the Pritikin Eating Plan can help reduce your risk.  Decoding Lab Results  Clinical staff conducted group or individual video education with verbal and written material and guidebook.  Patient learns that lab test reflects one measurement whose values change over time and are influenced by many factors, including medication, stress, sleep, exercise, food, hydration, pre-existing medical conditions, and more. It is recommended to use the knowledge from this video to become more involved with your lab results and evaluate your numbers to speak with your doctor.   Diseases of Our Time - Overview  Clinical staff conducted group or individual video education with verbal and written material and guidebook.  Patient learns that according to the CDC, 50% to 70% of chronic diseases (such as obesity, type 2 diabetes, elevated lipids, hypertension, and heart disease) are avoidable through lifestyle improvements including healthier food choices, listening to satiety cues, and increased physical activity.  Sleep Disorders Clinical staff conducted group or individual video education with verbal and written material and guidebook.  Patient learns how good quality and duration of sleep are important to overall health and well-being.  Patient also learns about sleep disorders and how they impact health along with recommendations to address them, including discussing with a physician.  Nutrition  Dining Out - Part 2 Clinical staff conducted group or individual video education with verbal and written material and guidebook.  Patient learns how to plan ahead and communicate in order to maximize their dining experience in a healthy and nutritious manner. Included are recommended food choices based on the type of restaurant the patient is visiting.   Fueling a Banker conducted group or individual video education with verbal and written material and guidebook.  There is a strong connection between our food choices and our health. Diseases like obesity and type 2 diabetes are very prevalent and are in large-part due to lifestyle choices. The Pritikin Eating Plan provides plenty of food and hunger-curbing satisfaction. It is easy to follow, affordable, and helps reduce health risks.  Menu Workshop  Clinical staff conducted group or individual video education with verbal and written material  and guidebook.  Patient learns that restaurant meals can sabotage health goals because they are often packed with calories, fat, sodium, and sugar. Recommendations include strategies to plan ahead and to communicate with the manager, chef, or server to help order a healthier meal.  Planning Your Eating Strategy  Clinical staff conducted group or individual video education with verbal and written material and guidebook.  Patient learns about the Pritikin Eating Plan and its benefit of reducing the risk of disease. The Pritikin Eating Plan does not focus on calories. Instead, it emphasizes high-quality, nutrient-rich foods. By knowing the characteristics of the foods, we choose, we can determine their calorie density and make informed decisions.  Targeting Your Nutrition Priorities  Clinical staff conducted group or individual  video education with verbal and written material and guidebook.  Patient learns that lifestyle habits have a tremendous impact on disease risk and progression. This video provides eating and physical activity recommendations based on your personal health goals, such as reducing LDL cholesterol, losing weight, preventing or controlling type 2 diabetes, and reducing high blood pressure.  Vitamins and Minerals  Clinical staff conducted group or individual video education with verbal and written material and guidebook.  Patient learns different ways to obtain key vitamins and minerals, including through a recommended healthy diet. It is important to discuss all supplements you take with your doctor.   Healthy Mind-Set    Smoking Cessation  Clinical staff conducted group or individual video education with verbal and written material and guidebook.  Patient learns that cigarette smoking and tobacco addiction pose a serious health risk which affects millions of people. Stopping smoking will significantly reduce the risk of heart disease, lung disease, and many forms of cancer. Recommended strategies for quitting are covered, including working with your doctor to develop a successful plan.  Culinary   Becoming a Set designer conducted group or individual video education with verbal and written material and guidebook.  Patient learns that cooking at home can be healthy, cost-effective, quick, and puts them in control. Keys to cooking healthy recipes will include looking at your recipe, assessing your equipment needs, planning ahead, making it simple, choosing cost-effective seasonal ingredients, and limiting the use of added fats, salts, and sugars.  Cooking - Breakfast and Snacks  Clinical staff conducted group or individual video education with verbal and written material and guidebook.  Patient learns how important breakfast is to satiety and nutrition through the entire day.  Recommendations include key foods to eat during breakfast to help stabilize blood sugar levels and to prevent overeating at meals later in the day. Planning ahead is also a key component.  Cooking - Educational psychologist conducted group or individual video education with verbal and written material and guidebook.  Patient learns eating strategies to improve overall health, including an approach to cook more at home. Recommendations include thinking of animal protein as a side on your plate rather than center stage and focusing instead on lower calorie dense options like vegetables, fruits, whole grains, and plant-based proteins, such as beans. Making sauces in large quantities to freeze for later and leaving the skin on your vegetables are also recommended to maximize your experience.  Cooking - Healthy Salads and Dressing Clinical staff conducted group or individual video education with verbal and written material and guidebook.  Patient learns that vegetables, fruits, whole grains, and legumes are the foundations of the Pritikin Eating Plan. Recommendations include how to incorporate each of these  in flavorful and healthy salads, and how to create homemade salad dressings. Proper handling of ingredients is also covered. Cooking - Soups and [employer] - Soups and Desserts Clinical staff conducted group or individual video education with verbal and written material and guidebook.  Patient learns that Pritikin soups and desserts make for easy, nutritious, and delicious snacks and meal components that are low in sodium, fat, sugar, and calorie density, while high in vitamins, minerals, and filling fiber. Recommendations include simple and healthy ideas for soups and desserts.   Overview     The Pritikin Solution Program Overview Clinical staff conducted group or individual video education with verbal and written material and guidebook.  Patient learns that the results of the Pritikin  Program have been documented in more than 100 articles published in peer-reviewed journals, and the benefits include reducing risk factors for (and, in some cases, even reversing) high cholesterol, high blood pressure, type 2 diabetes, obesity, and more! An overview of the three key pillars of the Pritikin Program will be covered: eating well, doing regular exercise, and having a healthy mind-set.  WORKSHOPS  Exercise: Exercise Basics: Building Your Action Plan Clinical staff led group instruction and group discussion with PowerPoint presentation and patient guidebook. To enhance the learning environment the use of posters, models and videos may be added. At the conclusion of this workshop, patients will comprehend the difference between physical activity and exercise, as well as the benefits of incorporating both, into their routine. Patients will understand the FITT (Frequency, Intensity, Time, and Type) principle and how to use it to build an exercise action plan. In addition, safety concerns and other considerations for exercise and cardiac rehab will be addressed by the presenter. The purpose of this lesson is to promote a comprehensive and effective weekly exercise routine in order to improve patients' overall level of fitness.   Managing Heart Disease: Your Path to a Healthier Heart Clinical staff led group instruction and group discussion with PowerPoint presentation and patient guidebook. To enhance the learning environment the use of posters, models and videos may be added.At the conclusion of this workshop, patients will understand the anatomy and physiology of the heart. Additionally, they will understand how Pritikin's three pillars impact the risk factors, the progression, and the management of heart disease.  The purpose of this lesson is to provide a high-level overview of the heart, heart disease, and how the Pritikin lifestyle positively impacts risk factors.  Exercise  Biomechanics Clinical staff led group instruction and group discussion with PowerPoint presentation and patient guidebook. To enhance the learning environment the use of posters, models and videos may be added. Patients will learn how the structural parts of their bodies function and how these functions impact their daily activities, movement, and exercise. Patients will learn how to promote a neutral spine, learn how to manage pain, and identify ways to improve their physical movement in order to promote healthy living. The purpose of this lesson is to expose patients to common physical limitations that impact physical activity. Participants will learn practical ways to adapt and manage aches and pains, and to minimize their effect on regular exercise. Patients will learn how to maintain good posture while sitting, walking, and lifting.  Balance Training and Fall Prevention  Clinical staff led group instruction and group discussion with PowerPoint presentation and patient guidebook. To enhance the learning environment the use of posters, models and videos may be added. At the conclusion of this workshop, patients will understand the  importance of their sensorimotor skills (vision, proprioception, and the vestibular system) in maintaining their ability to balance as they age. Patients will apply a variety of balancing exercises that are appropriate for their current level of function. Patients will understand the common causes for poor balance, possible solutions to these problems, and ways to modify their physical environment in order to minimize their fall risk. The purpose of this lesson is to teach patients about the importance of maintaining balance as they age and ways to minimize their risk of falling.  WORKSHOPS   Nutrition:  Fueling a Ship broker led group instruction and group discussion with PowerPoint presentation and patient guidebook. To enhance the learning  environment the use of posters, models and videos may be added. Patients will review the foundational principles of the Pritikin Eating Plan and understand what constitutes a serving size in each of the food groups. Patients will also learn Pritikin-friendly foods that are better choices when away from home and review make-ahead meal and snack options. Calorie density will be reviewed and applied to three nutrition priorities: weight maintenance, weight loss, and weight gain. The purpose of this lesson is to reinforce (in a group setting) the key concepts around what patients are recommended to eat and how to apply these guidelines when away from home by planning and selecting Pritikin-friendly options. Patients will understand how calorie density may be adjusted for different weight management goals.  Mindful Eating  Clinical staff led group instruction and group discussion with PowerPoint presentation and patient guidebook. To enhance the learning environment the use of posters, models and videos may be added. Patients will briefly review the concepts of the Pritikin Eating Plan and the importance of low-calorie dense foods. The concept of mindful eating will be introduced as well as the importance of paying attention to internal hunger signals. Triggers for non-hunger eating and techniques for dealing with triggers will be explored. The purpose of this lesson is to provide patients with the opportunity to review the basic principles of the Pritikin Eating Plan, discuss the value of eating mindfully and how to measure internal cues of hunger and fullness using the Hunger Scale. Patients will also discuss reasons for non-hunger eating and learn strategies to use for controlling emotional eating.  Targeting Your Nutrition Priorities Clinical staff led group instruction and group discussion with PowerPoint presentation and patient guidebook. To enhance the learning environment the use of posters, models and  videos may be added. Patients will learn how to determine their genetic susceptibility to disease by reviewing their family history. Patients will gain insight into the importance of diet as part of an overall healthy lifestyle in mitigating the impact of genetics and other environmental insults. The purpose of this lesson is to provide patients with the opportunity to assess their personal nutrition priorities by looking at their family history, their own health history and current risk factors. Patients will also be able to discuss ways of prioritizing and modifying the Pritikin Eating Plan for their highest risk areas  Menu  Clinical staff led group instruction and group discussion with PowerPoint presentation and patient guidebook. To enhance the learning environment the use of posters, models and videos may be added. Using menus brought in from [employer], or printed from [employer], patients will apply the Pritikin dining out guidelines that were presented in the [employer] video. Patients will also be able to practice these guidelines in a variety of provided scenarios. The purpose of this  lesson is to provide patients with the opportunity to practice hands-on learning of the Pritikin Dining Out guidelines with actual menus and practice scenarios.  Label Reading Clinical staff led group instruction and group discussion with PowerPoint presentation and patient guidebook. To enhance the learning environment the use of posters, models and videos may be added. Patients will review and discuss the Pritikin label reading guidelines presented in Pritikin's Label Reading Educational series video. Using fool labels brought in from local grocery stores and markets, patients will apply the label reading guidelines and determine if the packaged food meet the Pritikin guidelines. The purpose of this lesson is to provide patients with the opportunity to review, discuss, and practice  hands-on learning of the Pritikin Label Reading guidelines with actual packaged food labels. Cooking School  Pritikin's [employer] are designed to teach patients ways to prepare quick, simple, and affordable recipes at home. The importance of nutrition's role in chronic disease risk reduction is reflected in its emphasis in the overall Pritikin program. By learning how to prepare essential core Pritikin Eating Plan recipes, patients will increase control over what they eat; be able to customize the flavor of foods without the use of added salt, sugar, or fat; and improve the quality of the food they consume. By learning a set of core recipes which are easily assembled, quickly prepared, and affordable, patients are more likely to prepare more healthy foods at home. These workshops focus on convenient breakfasts, simple entres, side dishes, and desserts which can be prepared with minimal effort and are consistent with nutrition recommendations for cardiovascular risk reduction. Cooking [employer] are taught by a Armed forces logistics/support/administrative officer (RD) who has been trained by the [employer]. The chef or RD has a clear understanding of the importance of minimizing - if not completely eliminating - added fat, sugar, and sodium in recipes. Throughout the series of Cooking School Workshop sessions, patients will learn about healthy ingredients and efficient methods of cooking to build confidence in their capability to prepare    Cooking School weekly topics:  Adding Flavor- Sodium-Free  Fast and Healthy Breakfasts  Powerhouse Plant-Based Proteins  Satisfying Salads and Dressings  Simple Sides and Sauces  International Cuisine-Spotlight on the [employer] Zones  Delicious Desserts  Savory Soups  [employer] - Meals in a Astronomer Appetizers and Snacks  Comforting Weekend Breakfasts  One-Pot Wonders   Fast Evening Meals  Landscape architect Your Pritikin  Plate  WORKSHOPS   Healthy Mindset (Psychosocial):  Focused Goals, Sustainable Changes Clinical staff led group instruction and group discussion with PowerPoint presentation and patient guidebook. To enhance the learning environment the use of posters, models and videos may be added. Patients will be able to apply effective goal setting strategies to establish at least one personal goal, and then take consistent, meaningful action toward that goal. They will learn to identify common barriers to achieving personal goals and develop strategies to overcome them. Patients will also gain an understanding of how our mind-set can impact our ability to achieve goals and the importance of cultivating a positive and growth-oriented mind-set. The purpose of this lesson is to provide patients with a deeper understanding of how to set and achieve personal goals, as well as the tools and strategies needed to overcome common obstacles which may arise along the way.  From Head to Heart: The Power of a Healthy Outlook  Clinical staff led group instruction and group discussion with PowerPoint presentation  and patient guidebook. To enhance the learning environment the use of posters, models and videos may be added. Patients will be able to recognize and describe the impact of emotions and mood on physical health. They will discover the importance of self-care and explore self-care practices which may work for them. Patients will also learn how to utilize the 4 C's to cultivate a healthier outlook and better manage stress and challenges. The purpose of this lesson is to demonstrate to patients how a healthy outlook is an essential part of maintaining good health, especially as they continue their cardiac rehab journey.  Healthy Sleep for a Healthy Heart Clinical staff led group instruction and group discussion with PowerPoint presentation and patient guidebook. To enhance the learning environment the use of posters,  models and videos may be added. At the conclusion of this workshop, patients will be able to demonstrate knowledge of the importance of sleep to overall health, well-being, and quality of life. They will understand the symptoms of, and treatments for, common sleep disorders. Patients will also be able to identify daytime and nighttime behaviors which impact sleep, and they will be able to apply these tools to help manage sleep-related challenges. The purpose of this lesson is to provide patients with a general overview of sleep and outline the importance of quality sleep. Patients will learn about a few of the most common sleep disorders. Patients will also be introduced to the concept of "sleep hygiene," and discover ways to self-manage certain sleeping problems through simple daily behavior changes. Finally, the workshop will motivate patients by clarifying the links between quality sleep and their goals of heart-healthy living.   Recognizing and Reducing Stress Clinical staff led group instruction and group discussion with PowerPoint presentation and patient guidebook. To enhance the learning environment the use of posters, models and videos may be added. At the conclusion of this workshop, patients will be able to understand the types of stress reactions, differentiate between acute and chronic stress, and recognize the impact that chronic stress has on their health. They will also be able to apply different coping mechanisms, such as reframing negative self-talk. Patients will have the opportunity to practice a variety of stress management techniques, such as deep abdominal breathing, progressive muscle relaxation, and/or guided imagery.  The purpose of this lesson is to educate patients on the role of stress in their lives and to provide healthy techniques for coping with it.  Learning Barriers/Preferences:  Learning Barriers/Preferences - 09/25/22 1627       Learning Barriers/Preferences   Learning  Barriers Sight   wears glasses   Learning Preferences Audio;Computer/Internet;Group Instruction;Individual Instruction;Pictoral;Skilled Demonstration;Verbal Instruction;Video;Written Material             Education Topics:  Knowledge Questionnaire Score:  Knowledge Questionnaire Score - 09/25/22 1627       Knowledge Questionnaire Score   Pre Score 19/24             Core Components/Risk Factors/Patient Goals at Admission:  Personal Goals and Risk Factors at Admission - 09/25/22 1353       Core Components/Risk Factors/Patient Goals on Admission    Weight Management Yes;Obesity;Weight Loss    Intervention Weight Management: Develop a combined nutrition and exercise program designed to reach desired caloric intake, while maintaining appropriate intake of nutrient and fiber, sodium and fats, and appropriate energy expenditure required for the weight goal.;Weight Management: Provide education and appropriate resources to help participant work on and attain dietary goals.;Weight Management/Obesity: Establish reasonable short term  and long term weight goals.;Obesity: Provide education and appropriate resources to help participant work on and attain dietary goals.    Admit Weight 282 lb 13.6 oz (128.3 kg)    Diabetes Yes    Intervention Provide education about signs/symptoms and action to take for hypo/hyperglycemia.;Provide education about proper nutrition, including hydration, and aerobic/resistive exercise prescription along with prescribed medications to achieve blood glucose in normal ranges: Fasting glucose 65-99 mg/dL    Expected Outcomes Short Term: Participant verbalizes understanding of the signs/symptoms and immediate care of hyper/hypoglycemia, proper foot care and importance of medication, aerobic/resistive exercise and nutrition plan for blood glucose control.;Long Term: Attainment of HbA1C < 7%.    Heart Failure Yes    Intervention Provide a combined exercise and nutrition  program that is supplemented with education, support and counseling about heart failure. Directed toward relieving symptoms such as shortness of breath, decreased exercise tolerance, and extremity edema.    Expected Outcomes Short term: Attendance in program 2-3 days a week with increased exercise capacity. Reported lower sodium intake. Reported increased fruit and vegetable intake. Reports medication compliance.;Improve functional capacity of life;Short term: Daily weights obtained and reported for increase. Utilizing diuretic protocols set by physician.;Long term: Adoption of self-care skills and reduction of barriers for early signs and symptoms recognition and intervention leading to self-care maintenance.    Hypertension Yes    Intervention Provide education on lifestyle modifcations including regular physical activity/exercise, weight management, moderate sodium restriction and increased consumption of fresh fruit, vegetables, and low fat dairy, alcohol moderation, and smoking cessation.;Monitor prescription use compliance.    Expected Outcomes Short Term: Continued assessment and intervention until BP is < 140/89mm HG in hypertensive participants. < 130/70mm HG in hypertensive participants with diabetes, heart failure or chronic kidney disease.;Long Term: Maintenance of blood pressure at goal levels.    Lipids Yes    Intervention Provide education and support for participant on nutrition & aerobic/resistive exercise along with prescribed medications to achieve LDL 70mg , HDL >40mg .    Expected Outcomes Short Term: Participant states understanding of desired cholesterol values and is compliant with medications prescribed. Participant is following exercise prescription and nutrition guidelines.;Long Term: Cholesterol controlled with medications as prescribed, with individualized exercise RX and with personalized nutrition plan. Value goals: LDL < 70mg , HDL > 40 mg.    Stress Yes    Intervention Refer  participants experiencing significant psychosocial distress to appropriate mental health specialists for further evaluation and treatment. When possible, include family members and significant others in education/counseling sessions.;Offer individual and/or small group education and counseling on adjustment to heart disease, stress management and health-related lifestyle change. Teach and support self-help strategies.    Expected Outcomes Short Term: Participant demonstrates changes in health-related behavior, relaxation and other stress management skills, ability to obtain effective social support, and compliance with psychotropic medications if prescribed.;Long Term: Emotional wellbeing is indicated by absence of clinically significant psychosocial distress or social isolation.             Core Components/Risk Factors/Patient Goals Review:   Goals and Risk Factor Review     Row Name 10/14/22 1711 11/11/22 1634           Core Components/Risk Factors/Patient Goals Review   Personal Goals Review Weight Management/Obesity;Stress;Heart Failure;Hypertension;Lipids;Diabetes Weight Management/Obesity;Stress;Heart Failure;Hypertension;Lipids;Diabetes      Review Brently is off to a good start to exercise at intensive cardiac rehab. Vital signs have been stable. CBG's have been somewhat elevated. Dr Laural Benes recently increased his insulin. Will continue to  monitor CBG's. Misael has not exercised since 10/15/22. Jahkai plans to return to exercise on 11/14/22      Expected Outcomes Elmer will continue to participate in intensive cardiac rehab for exercise, nutrition and lifestyle modifications Elizeo will continue to participate in intensive cardiac rehab for exercise, nutrition and lifestyle modifications upon return ot exercise               Core Components/Risk Factors/Patient Goals at Discharge (Final Review):   Goals and Risk Factor Review - 11/11/22 1634       Core Components/Risk  Factors/Patient Goals Review   Personal Goals Review Weight Management/Obesity;Stress;Heart Failure;Hypertension;Lipids;Diabetes    Review Dwaine has not exercised since 10/15/22. Hursel plans to return to exercise on 11/14/22    Expected Outcomes Hunner will continue to participate in intensive cardiac rehab for exercise, nutrition and lifestyle modifications upon return ot exercise             ITP Comments:  ITP Comments     Row Name 09/25/22 1332 10/14/22 1703 11/11/22 1630       ITP Comments Dr. Armanda Magic medical director. Introduction to pritikin education program/ intensive cardiac rehab. Initital orientation packet reviewed with patient. 30 Day ITP Review. Burdett is off to a good start to exercise at  intensive cardiac rehab 30 Day ITP Review. Sharron's last day of attendance was on 10/15/22. Masaharu hopes to return to exercise on Friday 11/14/22              Comments: See ITP comments.

## 2022-11-12 ENCOUNTER — Ambulatory Visit (HOSPITAL_COMMUNITY): Payer: Medicaid Other

## 2022-11-12 ENCOUNTER — Encounter (HOSPITAL_COMMUNITY): Payer: Medicaid Other

## 2022-11-12 NOTE — Addendum Note (Signed)
Encounter addended by: Lorin Picket on: 11/12/2022 2:44 PM  Actions taken: Clinical Note Signed

## 2022-11-13 ENCOUNTER — Encounter: Payer: Self-pay | Admitting: Internal Medicine

## 2022-11-14 ENCOUNTER — Ambulatory Visit (HOSPITAL_COMMUNITY): Payer: Medicaid Other

## 2022-11-14 ENCOUNTER — Encounter (HOSPITAL_COMMUNITY): Payer: Medicaid Other

## 2022-11-14 ENCOUNTER — Encounter (HOSPITAL_COMMUNITY): Payer: Self-pay | Admitting: *Deleted

## 2022-11-14 DIAGNOSIS — I5022 Chronic systolic (congestive) heart failure: Secondary | ICD-10-CM

## 2022-11-14 NOTE — Addendum Note (Signed)
Encounter addended by: Lorin Picket on: 11/14/2022 2:14 PM  Actions taken: Flowsheet accepted

## 2022-11-14 NOTE — Progress Notes (Signed)
Discharge Progress Report  Patient Details  Name: [name] MRN: [id number] Date of Birth: [birth year] Referring Provider:   Flowsheet Row CARDIAC REHAB PHASE II ORIENTATION from 09/25/2022 in [facility]  Referring Provider Olga Millers, MD        Number of Visits: 9  Reason for Discharge:  Early Exit:  Lack of attendance  Smoking History:  Social History   Tobacco Use  Smoking Status Former   Packs/day: 1.00   Years: 28.00   Additional pack years: 0.00   Total pack years: 28.00   Types: Cigarettes   Quit date: 06/03/2014   Years since quitting: 8.4  Smokeless Tobacco Never    Diagnosis:  Heart failure, chronic systolic (HCC)  ADL UCSD:   Initial Exercise Prescription:  Initial Exercise Prescription - 09/25/22 1600       Date of Initial Exercise RX and Referring Provider   Date 09/25/22    Referring Provider Olga Millers, MD    Expected Discharge Date 11/07/22      Treadmill   MPH 1.7    Grade 0    Minutes 15    METs 2      NuStep   Level 1    SPM 75    Minutes 15    METs 2      Prescription Details   Frequency (times per week) 3    Duration Progress to 30 minutes of continuous aerobic without signs/symptoms of physical distress      Intensity   THRR 40-80% of Max Heartrate 65-130    Ratings of Perceived Exertion 11-13    Perceived Dyspnea 0-4      Progression   Progression Continue progressive overload as per policy without signs/symptoms or physical distress.      Resistance Training   Training Prescription Yes    Weight 2    Reps 10-15             Discharge Exercise Prescription (Final Exercise Prescription Changes):  Exercise Prescription Changes - 10/03/22 1503       Response to Exercise   Blood Pressure (Admit) 108/70    Blood Pressure (Exercise) 176/80    Blood Pressure (Exit) 115/76    Heart Rate (Admit) 75 bpm    Heart Rate (Exercise) 91 bpm    Heart  Rate (Exit) 63 bpm    Rating of Perceived Exertion (Exercise) 11    Symptoms Pt lightheaded after using treadmill    Comments Pt's first day in the CRP2 program    Duration Continue with 30 min of aerobic exercise without signs/symptoms of physical distress.    Intensity THRR unchanged      Progression   Progression Continue to progress workloads to maintain intensity without signs/symptoms of physical distress.    Average METs 2.3      Resistance Training   Training Prescription No    Weight Weights held due to being lightheaded      Interval Training   Interval Training No      Treadmill   MPH 1.7    Grade 0    Minutes 15    METs 2.3      NuStep   Level --   Not done due to being lightheaded            Functional Capacity:  6 Minute Walk     Row Name 09/25/22 1608         6  Minute Walk   Phase Initial     Distance 1109 feet     Walk Time 6 minutes     # of Rest Breaks 0     MPH 2.1     METS 2.4     RPE 12     Perceived Dyspnea  1     VO2 Peak 8.32     Symptoms Yes (comment)     Comments 4/10 chronic low back pain, SOB. SOB resolved with rest     Resting HR 62 bpm     Resting BP 114/76     Resting Oxygen Saturation  97 %     Exercise Oxygen Saturation  during 6 min walk 97 %     Max Ex. HR 80 bpm     Max Ex. BP 112/68     2 Minute Post BP 118/80              Psychological, QOL, Others - Outcomes: PHQ 2/9:    09/25/2022    3:48 PM 07/14/2022    2:15 PM 07/10/2022   11:35 AM 06/17/2022    3:40 PM 03/04/2022    4:44 PM  Depression screen PHQ 2/9  Decreased Interest 2 1  1 1   Down, Depressed, Hopeless 1 1  2 1   PHQ - 2 Score 3 2  3 2   Altered sleeping 2 1  1 1   Tired, decreased energy 1 1  0 1  Change in appetite 1 0  0 1  Feeling bad or failure about yourself  0 0  0 0  Trouble concentrating 1 1  0 0  Moving slowly or fidgety/restless 0 0  0 0  Suicidal thoughts 0 0  1 0  PHQ-9 Score 8 5  5 5   Difficult doing work/chores Somewhat difficult          Information is confidential and restricted. Go to Review Flowsheets to unlock data.    Quality of Life:  Quality of Life - 09/25/22 1627       Quality of Life   Select Quality of Life      Quality of Life Scores   Health/Function Pre 16.3 %    Socioeconomic Pre 18.5 %    Psych/Spiritual Pre 16.21 %    Family Pre 20 %    GLOBAL Pre 17.06 %             Personal Goals: Goals established at orientation with interventions provided to work toward goal.  Personal Goals and Risk Factors at Admission - 09/25/22 1353       Core Components/Risk Factors/Patient Goals on Admission    Weight Management Yes;Obesity;Weight Loss    Intervention Weight Management: Develop a combined nutrition and exercise program designed to reach desired caloric intake, while maintaining appropriate intake of nutrient and fiber, sodium and fats, and appropriate energy expenditure required for the weight goal.;Weight Management: Provide education and appropriate resources to help participant work on and attain dietary goals.;Weight Management/Obesity: Establish reasonable short term and long term weight goals.;Obesity: Provide education and appropriate resources to help participant work on and attain dietary goals.    Admit Weight 282 lb 13.6 oz (128.3 kg)    Diabetes Yes    Intervention Provide education about signs/symptoms and action to take for hypo/hyperglycemia.;Provide education about proper nutrition, including hydration, and aerobic/resistive exercise prescription along with prescribed medications to achieve blood glucose in normal ranges: Fasting glucose 65-99 mg/dL    Expected Outcomes Short Term:  Participant verbalizes understanding of the signs/symptoms and immediate care of hyper/hypoglycemia, proper foot care and importance of medication, aerobic/resistive exercise and nutrition plan for blood glucose control.;Long Term: Attainment of HbA1C < 7%.    Heart Failure Yes    Intervention Provide  a combined exercise and nutrition program that is supplemented with education, support and counseling about heart failure. Directed toward relieving symptoms such as shortness of breath, decreased exercise tolerance, and extremity edema.    Expected Outcomes Short term: Attendance in program 2-3 days a week with increased exercise capacity. Reported lower sodium intake. Reported increased fruit and vegetable intake. Reports medication compliance.;Improve functional capacity of life;Short term: Daily weights obtained and reported for increase. Utilizing diuretic protocols set by physician.;Long term: Adoption of self-care skills and reduction of barriers for early signs and symptoms recognition and intervention leading to self-care maintenance.    Hypertension Yes    Intervention Provide education on lifestyle modifcations including regular physical activity/exercise, weight management, moderate sodium restriction and increased consumption of fresh fruit, vegetables, and low fat dairy, alcohol moderation, and smoking cessation.;Monitor prescription use compliance.    Expected Outcomes Short Term: Continued assessment and intervention until BP is < 140/39mm HG in hypertensive participants. < 130/79mm HG in hypertensive participants with diabetes, heart failure or chronic kidney disease.;Long Term: Maintenance of blood pressure at goal levels.    Lipids Yes    Intervention Provide education and support for participant on nutrition & aerobic/resistive exercise along with prescribed medications to achieve LDL 70mg , HDL >40mg .    Expected Outcomes Short Term: Participant states understanding of desired cholesterol values and is compliant with medications prescribed. Participant is following exercise prescription and nutrition guidelines.;Long Term: Cholesterol controlled with medications as prescribed, with individualized exercise RX and with personalized nutrition plan. Value goals: LDL < 70mg , HDL > 40 mg.     Stress Yes    Intervention Refer participants experiencing significant psychosocial distress to appropriate mental health specialists for further evaluation and treatment. When possible, include family members and significant others in education/counseling sessions.;Offer individual and/or small group education and counseling on adjustment to heart disease, stress management and health-related lifestyle change. Teach and support self-help strategies.    Expected Outcomes Short Term: Participant demonstrates changes in health-related behavior, relaxation and other stress management skills, ability to obtain effective social support, and compliance with psychotropic medications if prescribed.;Long Term: Emotional wellbeing is indicated by absence of clinically significant psychosocial distress or social isolation.              Personal Goals Discharge:  Goals and Risk Factor Review     Row Name 10/14/22 1711 11/11/22 1634           Core Components/Risk Factors/Patient Goals Review   Personal Goals Review Weight Management/Obesity;Stress;Heart Failure;Hypertension;Lipids;Diabetes Weight Management/Obesity;Stress;Heart Failure;Hypertension;Lipids;Diabetes      Review Azarias is off to a good start to exercise at intensive cardiac rehab. Vital signs have been stable. CBG's have been somewhat elevated. Dr Laural Benes recently increased his insulin. Will continue to monitor CBG's. Aydan has not exercised since 10/15/22. Phyllis plans to return to exercise on 11/14/22      Expected Outcomes Tramane will continue to participate in intensive cardiac rehab for exercise, nutrition and lifestyle modifications Kyel will continue to participate in intensive cardiac rehab for exercise, nutrition and lifestyle modifications upon return ot exercise               Exercise Goals and Review:  Exercise Goals     Row  Name 09/25/22 1352             Exercise Goals   Increase Physical Activity Yes        Intervention Provide advice, education, support and counseling about physical activity/exercise needs.;Develop an individualized exercise prescription for aerobic and resistive training based on initial evaluation findings, risk stratification, comorbidities and participant's personal goals.       Expected Outcomes Short Term: Attend rehab on a regular basis to increase amount of physical activity.;Long Term: Exercising regularly at least 3-5 days a week.;Long Term: Add in home exercise to make exercise part of routine and to increase amount of physical activity.       Increase Strength and Stamina Yes       Intervention Provide advice, education, support and counseling about physical activity/exercise needs.;Develop an individualized exercise prescription for aerobic and resistive training based on initial evaluation findings, risk stratification, comorbidities and participant's personal goals.       Expected Outcomes Short Term: Increase workloads from initial exercise prescription for resistance, speed, and METs.;Short Term: Perform resistance training exercises routinely during rehab and add in resistance training at home;Long Term: Improve cardiorespiratory fitness, muscular endurance and strength as measured by increased METs and functional capacity ( )       Able to understand and use rate of perceived exertion (RPE) scale Yes       Intervention Provide education and explanation on how to use RPE scale       Expected Outcomes Short Term: Able to use RPE daily in rehab to express subjective intensity level;Long Term:  Able to use RPE to guide intensity level when exercising independently       Knowledge and understanding of Target Heart Rate Range (THRR) Yes       Intervention Provide education and explanation of THRR including how the numbers were predicted and where they are located for reference       Expected Outcomes Short Term: Able to state/look up THRR;Short Term: Able to use daily as  guideline for intensity in rehab;Long Term: Able to use THRR to govern intensity when exercising independently       Understanding of Exercise Prescription Yes       Intervention Provide education, explanation, and written materials on patient's individual exercise prescription       Expected Outcomes Short Term: Able to explain program exercise prescription;Long Term: Able to explain home exercise prescription to exercise independently                Exercise Goals Re-Evaluation:  Exercise Goals Re-Evaluation     Row Name 10/03/22 1505             Exercise Goal Re-Evaluation   Exercise Goals Review Increase Physical Activity;Increase Strength and Stamina;Able to understand and use rate of perceived exertion (RPE) scale;Knowledge and understanding of Target Heart Rate Range (THRR);Understanding of Exercise Prescription       Comments Pt's first day in the CRP2 program. Pt understnads the exercise RX, RPE sclae and THRR,       Expected Outcomes Will continue to montior patient and progress exercise workloads as tolerated.                Nutrition & Weight - Outcomes:  Pre Biometrics - 09/25/22 1323       Pre Biometrics   Waist Circumference 50.5 inches    Hip Circumference 47.75 inches    Waist to Hip Ratio 1.06 %    Triceps Skinfold 30 mm    %  Body Fat 37.9 %    Grip Strength 30 kg    Flexibility 0 in   no done due to low back pain   Single Leg Stand 30 seconds              Nutrition:  Nutrition Therapy & Goals - 10/31/22 1341       Nutrition Therapy   Diet Heart Healthy Diet    Drug/Food Interactions Statins/Certain Fruits      Personal Nutrition Goals   Nutrition Goal Patient to identify strategies for reducing cardiovascular risk by attending the [employer] education and nutrition series weekly.    Personal Goal #2 Patient to improve diet quality by using the plate method as a guide for meal planning to include lean protein/plant protein, fruits,  vegetables, whole grains, nonfat dairy as part of a well-balanced diet.    Personal Goal #3 Patient to reduce sodium to 1500mg  per day.    Personal Goal #4 Patient to identify strategies for weight loss of 0.5-2.0# per week.    Comments Gurshaan has not attended intensive cardiac rehab since 10/15/2022. Will follow-up on nutrition goals upon patient's return. Esper will benefit from from participation in intensive cardiac rehab for nutrition, exercise, and lifestyle modification.      Intervention Plan   Intervention Prescribe, educate and counsel regarding individualized specific dietary modifications aiming towards targeted core components such as weight, hypertension, lipid management, diabetes, heart failure and other comorbidities.;Nutrition handout(s) given to patient.    Expected Outcomes Short Term Goal: Understand basic principles of dietary content, such as calories, fat, sodium, cholesterol and nutrients.;Long Term Goal: Adherence to prescribed nutrition plan.             Nutrition Discharge:   Education Questionnaire Score:  Knowledge Questionnaire Score - 09/25/22 1627       Knowledge Questionnaire Score   Pre Score 19/24             Goals reviewed with patient; copy given to patient.Pt graduates from  Intensive/Traditional cardiac rehab program today with completion of  9 exercise and education sessions between 09/25/22 to 10/15/22. Nolton's attendance was poor. Fabiano did not return to exercise after a respiratory exacerbation. Thayer Headings RN BSN

## 2022-11-17 ENCOUNTER — Encounter (HOSPITAL_COMMUNITY): Payer: Medicaid Other

## 2022-11-17 ENCOUNTER — Ambulatory Visit (HOSPITAL_COMMUNITY): Payer: Medicaid Other

## 2022-11-19 ENCOUNTER — Encounter (HOSPITAL_COMMUNITY): Payer: Medicaid Other

## 2022-11-19 ENCOUNTER — Ambulatory Visit (HOSPITAL_COMMUNITY): Payer: Medicaid Other

## 2022-11-19 ENCOUNTER — Ambulatory Visit (INDEPENDENT_AMBULATORY_CARE_PROVIDER_SITE_OTHER): Payer: Medicaid Other

## 2022-11-19 DIAGNOSIS — I428 Other cardiomyopathies: Secondary | ICD-10-CM | POA: Diagnosis not present

## 2022-11-19 LAB — CUP PACEART REMOTE DEVICE CHECK
Battery Remaining Longevity: 95 mo
Battery Remaining Percentage: 90 %
Battery Voltage: 3.01 V
Brady Statistic AP VP Percent: 1 %
Brady Statistic AP VS Percent: 56 %
Brady Statistic AS VP Percent: 1 %
Brady Statistic AS VS Percent: 43 %
Brady Statistic RA Percent Paced: 56 %
Brady Statistic RV Percent Paced: 1 %
Date Time Interrogation Session: 20240611020414
HighPow Impedance: 83 Ohm
Implantable Lead Connection Status: 753985
Implantable Lead Connection Status: 753985
Implantable Lead Implant Date: 20150717
Implantable Lead Implant Date: 20231207
Implantable Lead Location: 753859
Implantable Lead Location: 753860
Implantable Pulse Generator Implant Date: 20231207
Lead Channel Impedance Value: 380 Ohm
Lead Channel Impedance Value: 430 Ohm
Lead Channel Pacing Threshold Amplitude: 0.375 V
Lead Channel Pacing Threshold Amplitude: 0.75 V
Lead Channel Pacing Threshold Pulse Width: 0.5 ms
Lead Channel Pacing Threshold Pulse Width: 0.5 ms
Lead Channel Sensing Intrinsic Amplitude: 12 mV
Lead Channel Sensing Intrinsic Amplitude: 2.5 mV
Lead Channel Setting Pacing Amplitude: 1.375
Lead Channel Setting Pacing Amplitude: 2 V
Lead Channel Setting Pacing Pulse Width: 0.5 ms
Lead Channel Setting Sensing Sensitivity: 0.5 mV
Pulse Gen Serial Number: 810051574
Zone Setting Status: 755011

## 2022-11-21 ENCOUNTER — Ambulatory Visit (HOSPITAL_COMMUNITY): Payer: Medicaid Other

## 2022-11-21 ENCOUNTER — Encounter (HOSPITAL_COMMUNITY): Payer: Medicaid Other

## 2022-11-23 ENCOUNTER — Other Ambulatory Visit (HOSPITAL_COMMUNITY): Payer: Self-pay | Admitting: Psychiatry

## 2022-11-23 DIAGNOSIS — F411 Generalized anxiety disorder: Secondary | ICD-10-CM

## 2022-11-23 DIAGNOSIS — F410 Panic disorder [episodic paroxysmal anxiety] without agoraphobia: Secondary | ICD-10-CM

## 2022-11-24 ENCOUNTER — Ambulatory Visit (HOSPITAL_COMMUNITY): Payer: Medicaid Other

## 2022-11-24 ENCOUNTER — Encounter (HOSPITAL_COMMUNITY): Payer: Medicaid Other

## 2022-11-24 ENCOUNTER — Encounter: Payer: Self-pay | Admitting: Internal Medicine

## 2022-11-24 DIAGNOSIS — E1169 Type 2 diabetes mellitus with other specified complication: Secondary | ICD-10-CM

## 2022-11-24 MED ORDER — NOVOLOG FLEXPEN 100 UNIT/ML ~~LOC~~ SOPN
PEN_INJECTOR | SUBCUTANEOUS | 1 refills | Status: DC
Start: 2022-11-24 — End: 2022-12-12

## 2022-11-26 ENCOUNTER — Encounter (HOSPITAL_COMMUNITY): Payer: Medicaid Other

## 2022-11-26 ENCOUNTER — Ambulatory Visit (HOSPITAL_COMMUNITY): Payer: Medicaid Other

## 2022-11-28 ENCOUNTER — Telehealth: Payer: Self-pay

## 2022-11-28 ENCOUNTER — Encounter: Payer: Self-pay | Admitting: Internal Medicine

## 2022-11-28 ENCOUNTER — Ambulatory Visit (HOSPITAL_COMMUNITY): Payer: Medicaid Other

## 2022-11-28 ENCOUNTER — Ambulatory Visit (INDEPENDENT_AMBULATORY_CARE_PROVIDER_SITE_OTHER): Payer: Medicaid Other | Admitting: Internal Medicine

## 2022-11-28 ENCOUNTER — Encounter (HOSPITAL_COMMUNITY): Payer: Medicaid Other

## 2022-11-28 VITALS — BP 138/76 | HR 63 | Temp 98.3°F | Ht 71.0 in | Wt 287.2 lb

## 2022-11-28 DIAGNOSIS — J4489 Other specified chronic obstructive pulmonary disease: Secondary | ICD-10-CM | POA: Diagnosis not present

## 2022-11-28 DIAGNOSIS — J439 Emphysema, unspecified: Secondary | ICD-10-CM | POA: Diagnosis not present

## 2022-11-28 NOTE — Patient Instructions (Addendum)
Please schedule follow up scheduled with myself in 4 months.  If my schedule is not open yet, we will contact you with a reminder closer to that time. Please call [phone] if you haven't heard from [place] a month before.   I will follow up on the sleep study you had done.   I am ordering a CT scan of your lungs to make sure there is nothing else going on besides COPD.   I will reach out to your heart doctors about your condition.  You need to go back to cardiac rehab.

## 2022-11-28 NOTE — Telephone Encounter (Signed)
This Study is on Dr Wynona Neat desk to be read

## 2022-11-28 NOTE — Progress Notes (Signed)
AIDENN SKELLENGER    [id number]    01/28/1966  Primary Care Physician:Johnson, Binnie Rail, MD Date of Appointment: 11/28/2022 Established Patient Visit  Chief complaint:   Chief Complaint  Patient presents with   Follow-up    SOB with exertion      HPI: [name] is a 57 y.o. man with history of NICM, HFrEF EF 20%, h/o VT s/p ICD placement, and COPD with former tobacco use disorder.   Interval Updates: Here for follow up. Had pfts that show severe airflow limitation FEV1 42% of predicted  He felt like trelegy was giving him chest pressure. He is currently on no inhalers. We also prescribed flovent. He had the same symptoms. He has a nebulizer machine at home but isn't using any breathing treatments either because they make him uncomfortable.   He has shortness of breath and episodes of gasping for breath 4-5 times/day. Leads to feelings of anxiety.   He is seeing a psychiatrist and psychologist regarding his VT storm requiring multiple ICD firings.   He sleeps poorly. He started cardiac rehab but was discharged due to lack of attendance.   1st Cousin has sarcoidosis.  No skin changes.  Has had recurrent kidney stones.  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    I have reviewed the patient's family social and past medical history and updated as appropriate.   Past Medical History:  Diagnosis Date   AICD (automatic cardioverter/defibrillator) present    Chronic bronchitis (HCC)    "get it most q yr" (12/23/2013)   Chronic systolic (congestive) heart failure (HCC)    Fracture of left humerus    a. 07/2013.   GERD (gastroesophageal reflux disease)    not at present time   Heart murmur    "born w/it"    History of renal calculi    Hypertension    Kidney stones    NICM (nonischemic cardiomyopathy) (HCC)    a. 07/2013 Echo: EF 20-25%, diff HK, Gr2 DD, mild MR, mod dil LA/RA. 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    Shockable heart rhythm detected by automated external defibrillator    Syncope    Type II diabetes mellitus (HCC) 2006    Past Surgical History:  Procedure Laterality Date   CARDIAC CATHETERIZATION  08/10/13   CHOLECYSTECTOMY  ~ 2010   IMPLANTABLE CARDIOVERTER DEFIBRILLATOR IMPLANT  12/23/2013   STJ Fortify ICD implanted by Dr Johney Frame for cardiomyopathy and syncope   IMPLANTABLE CARDIOVERTER DEFIBRILLATOR IMPLANT N/A 12/23/2013   Procedure: IMPLANTABLE CARDIOVERTER DEFIBRILLATOR IMPLANT;  Surgeon: Gardiner Rhyme, MD;  Location: [facility] CATH LAB;  Service: Cardiovascular;  Laterality: N/A;   INGUINAL HERNIA REPAIR Left 03/01/2018   Procedure: OPEN REPAIR OF LEFT INGUINAL HERNIA WITH MESH;  Surgeon: Gaynelle Adu, MD;  Location: [facility] ORS;  Service: General;  Laterality: Left;   LEAD REVISION/REPAIR N/A 05/15/2022   Procedure: LEAD REVISION/REPAIR;  Surgeon: Lanier Prude, MD;  Location: [facility] INVASIVE CV LAB;  Service: Cardiovascular;  Laterality: N/A;   LEFT HEART CATHETERIZATION WITH CORONARY ANGIOGRAM N/A 08/10/2013   Procedure: LEFT HEART CATHETERIZATION WITH CORONARY ANGIOGRAM;  Surgeon: Corky Crafts, MD;  Location: [facility] CATH LAB;  Service: Cardiovascular;  Laterality: N/A;   ORIF HUMERUS FRACTURE Left 08/12/2013   Procedure: OPEN REDUCTION INTERNAL FIXATION (ORIF) HUMERAL SHAFT FRACTURE;  Surgeon: Nadara Mustard, MD;  Location: [facility] OR;  Service: Orthopedics;  Laterality: Left;  Open Reduction Internal Fixation Left Humerus   RIGHT/LEFT HEART CATH AND CORONARY ANGIOGRAPHY N/A 05/12/2022   Procedure: RIGHT/LEFT HEART CATH AND CORONARY ANGIOGRAPHY;  Surgeon: Dorthula Nettles, DO;  Location: [facility] INVASIVE CV LAB;  Service: Cardiovascular;  Laterality: N/A;   URETEROSCOPY     "laser for kidney stones"    Family History  Problem Relation Age of Onset   Diabetes Father    Arthritis Father    Hypertension Father    Diabetes Mother    Arthritis Mother    Hypertension  Mother    Hypertension Brother     Social History   Occupational History   Occupation: Disabled  Tobacco Use   Smoking status: Former    Packs/day: 1.00    Years: 28.00    Additional pack years: 0.00    Total pack years: 28.00    Types: Cigarettes    Quit date: 06/03/2014    Years since quitting: 8.4   Smokeless tobacco: Never  Vaping Use   Vaping Use: Never used  Substance and Sexual Activity   Alcohol use: Yes   Drug use: Yes    Types: Marijuana    Comment: occasionally   Sexual activity: Yes     Physical Exam: Blood pressure 138/76, pulse 63, temperature 98.3 F (36.8 C), temperature source Oral, height 5\' 11"  (1.803 m), weight 287 lb 3.2 oz (130.3 kg), SpO2 95 %.   Gen:      No acute distress, non toxic, mild nasal congestion noted in voice Lungs:    No increased respiratory effort.able to complete full sentences, no audible wheezing CV:  RRR no mrg, ICD pocket in place  Ext: no edema  Data Reviewed: Imaging: I have personally reviewed the chest xray Feb 2024 shows cardiomegaly but clear lungs, no effusion or pna  PFTs:     Latest Ref Rng & Units 10/09/2022    9:05 AM 02/07/2016    1:37 PM  PFT Results  FVC-Pre L 3.13  3.31   FVC-Predicted Pre % 58  71   FVC-Post L 3.25  3.23   FVC-Predicted Post % 60  69   Pre FEV1/FVC % % 56  79   Post FEV1/FCV % % 74  73   FEV1-Pre L 1.74  2.60   FEV1-Predicted Pre % 42  70   FEV1-Post L 2.39  2.36   DLCO uncorrected ml/min/mmHg 23.92  19.77   DLCO UNC% % 77  54   DLCO corrected ml/min/mmHg 23.92    DLCO COR %Predicted % 77    DLVA Predicted % 123  110   TLC L 5.63  5.68   TLC % Predicted % 74  75   RV % Predicted % 109  102    I have personally reviewed the patient's PFTs from May 2024 - severe airflow limitation FEV1 42% of predicted  Labs: Lab Results  Component Value Date   NA 141 08/05/2022   K 4.4 08/05/2022   CO2 17 (L) 08/05/2022   GLUCOSE 138 (H) 08/05/2022   BUN 22 (H) 08/05/2022   CREATININE  2.14 (H) 08/05/2022   CALCIUM 8.6 (L) 08/05/2022   GFR 32.38 (L) 07/23/2022   EGFR 28.0 10/29/2022   GFRNONAA 35 (L) 08/05/2022   Lab Results  Component Value Date   WBC 8.3 06/14/2022   HGB 13.6 06/14/2022   HCT 41.0 06/14/2022   MCV 90.1 06/14/2022   PLT 335 06/14/2022  Immunization status: Immunization History  Administered Date(s) Administered   PFIZER(Purple Top)SARS-COV-2 Vaccination 10/21/2019    External Records Personally Reviewed: cardiology, pulmonary, pcp  Assessment:  COPD, 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   I am ordering a CT scan of your lungs to make sure there is nothing else going on besides COPD such as infiltrative lung disease from sarcoid.  He does need lung cancer screening as well given h/o tobacco use disorder and this will cover for that.   I will reach out to your heart doctors about your condition. 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  This would change management as he would qualify for immune suppression therapy.   He has tried breztri in the past and that did not suit him either. At this point I don't think any inhaler is going to make him feel good. He says they all give him chest discomfort. 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 I wonder if this is part of the problem.  I think he needs to get back in cardiac rehab - have asked him to go back. This will probably help all of his symptoms. It seems he was discharged for lack of attendance, but he is saying that they told him to get his inhalers "straightened out" before coming back.     Return to Care: Return in about 4 months (around 03/30/2023).    Durel Salts, MD Pulmonary and Critical Care Medicine [facility] Office:[phone]

## 2022-11-28 NOTE — Telephone Encounter (Signed)
PCCs can you please provide updated on HST ordered 11/04/22. Pt says he completed the test and turned the machine back in and yet Dr. Celine Mans still has no results. Please advise.

## 2022-12-01 ENCOUNTER — Encounter (HOSPITAL_COMMUNITY): Payer: Medicaid Other | Attending: Cardiology

## 2022-12-01 ENCOUNTER — Ambulatory Visit (HOSPITAL_COMMUNITY): Payer: Medicaid Other

## 2022-12-01 ENCOUNTER — Encounter (HOSPITAL_COMMUNITY): Payer: Medicaid Other

## 2022-12-01 DIAGNOSIS — I5022 Chronic systolic (congestive) heart failure: Secondary | ICD-10-CM | POA: Diagnosis not present

## 2022-12-01 DIAGNOSIS — F411 Generalized anxiety disorder: Secondary | ICD-10-CM | POA: Diagnosis not present

## 2022-12-01 DIAGNOSIS — Z95810 Presence of automatic (implantable) cardiac defibrillator: Secondary | ICD-10-CM | POA: Insufficient documentation

## 2022-12-03 ENCOUNTER — Encounter (HOSPITAL_COMMUNITY): Payer: Medicaid Other

## 2022-12-03 ENCOUNTER — Ambulatory Visit (HOSPITAL_BASED_OUTPATIENT_CLINIC_OR_DEPARTMENT_OTHER): Payer: Medicaid Other | Admitting: Psychiatry

## 2022-12-03 ENCOUNTER — Ambulatory Visit (HOSPITAL_COMMUNITY): Payer: Medicaid Other

## 2022-12-03 VITALS — BP 111/70 | HR 67 | Ht 72.0 in | Wt 287.0 lb

## 2022-12-03 DIAGNOSIS — F4310 Post-traumatic stress disorder, unspecified: Secondary | ICD-10-CM

## 2022-12-03 DIAGNOSIS — F410 Panic disorder [episodic paroxysmal anxiety] without agoraphobia: Secondary | ICD-10-CM

## 2022-12-03 DIAGNOSIS — F411 Generalized anxiety disorder: Secondary | ICD-10-CM | POA: Diagnosis not present

## 2022-12-03 MED ORDER — CLONAZEPAM 0.5 MG PO TABS
0.5000 mg | ORAL_TABLET | Freq: Two times a day (BID) | ORAL | 0 refills | Status: DC
Start: 2022-12-19 — End: 2023-01-13
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
## 2022-12-03 NOTE — Telephone Encounter (Signed)
Message routed to Dr. Olalere 

## 2022-12-03 NOTE — Progress Notes (Signed)
EPIC Encounter for ICM Monitoring  Patient Name: [name] is a 57 y.o. male Date: 12/03/2022 Primary Care Physican: Marcine Matar, MD Primary Cardiologist: Crenshaw/[name] Electrophysiologist: Lalla Brothers 06/17/2022 Office Weight: 251 lbs 08/04/2022 Weight: 271 lbs 08/12/2022 Weight: 271 lbs 09/22/2022 Weight: 281 lbs 12/03/2022 Weight: 282 lbs                            Spoke with patient and heart failure questions reviewed.  Transmission results reviewed.  Pt reports swelling in stomach and thinks he is drinking more than recommended 64 oz daily.       Corvue thoracic impedance suggesting possible fluid accumulation starting 6/20.   Prescribed dosage:  Furosemide 80 mg take 1 tablet (80 mg total) as needed daily for fluid.   Pt is taking 04/21/2022 prescription from Dr Jens Som for 80 mg twice a day which is not the current script on file in EPIC. Spironolactone 25 mg take 1 tablet (0.5 mg total) by mouth once a day.    Labs: 10/28/2022 Creatinine 2.56, BUN 30, Potassium 3.7, Sodium 139, GFR 28 08/05/2022 Creatinine 2.14, BUN 22, Potassium 4.4, Sodium 141, GFR 35 06/16/2022 Creatinine 2.07, BUN 23, Potassium 4.1, Sodium 138, GFR 37  06/15/2022 Creatinine 2.33, BUN 25, Potassium 4.1, Sodium 137, GFR 32  06/14/2022 Creatinine 2.65, BUN 19, Potassium 4.4, Sodium 136, GFR 27  A complete set of results can be found in Results Review.   Recommendations:  Pt asking for Furosemide refill that is different from [facility] prescription.  Advised to call Dr Jens Som or HF clinic to get updated Furosemide script.   Advised to limit fluid to 64 oz daily and limit salt intake. Copy sent to Dr Jens Som for review and recommendations if needed.     Follow-up plan: ICM clinic phone appointment on 12/08/2022 to recheck fluid levels.   91 day device clinic remote transmission 02/18/2023.     EP/Cardiology Office Visits:  01/23/2023 with Dr Jens Som.  02/25/2023 with Dr Lalla Brothers.  Recall 02/21/2023 with Dr  Gasper Lloyd.   Copy of ICM check sent to Dr Lalla Brothers.   3 month ICM trend: 12/01/2022.    12-14 Month ICM trend:     Karie Soda, RN 12/03/2022 7:48 AM

## 2022-12-05 ENCOUNTER — Encounter (HOSPITAL_COMMUNITY): Payer: Medicaid Other

## 2022-12-05 ENCOUNTER — Ambulatory Visit (HOSPITAL_COMMUNITY): Payer: Medicaid Other

## 2022-12-07 ENCOUNTER — Other Ambulatory Visit: Payer: Self-pay | Admitting: Internal Medicine

## 2022-12-07 DIAGNOSIS — E1169 Type 2 diabetes mellitus with other specified complication: Secondary | ICD-10-CM

## 2022-12-08 ENCOUNTER — Encounter: Payer: Self-pay | Admitting: Internal Medicine

## 2022-12-08 ENCOUNTER — Other Ambulatory Visit: Payer: Self-pay | Admitting: Internal Medicine

## 2022-12-08 ENCOUNTER — Encounter: Payer: Medicaid Other | Attending: Cardiology

## 2022-12-08 ENCOUNTER — Ambulatory Visit (HOSPITAL_COMMUNITY): Payer: Medicaid Other

## 2022-12-08 ENCOUNTER — Encounter (HOSPITAL_COMMUNITY): Payer: Medicaid Other

## 2022-12-08 ENCOUNTER — Encounter: Payer: Self-pay | Admitting: Cardiology

## 2022-12-08 DIAGNOSIS — I5042 Chronic combined systolic (congestive) and diastolic (congestive) heart failure: Secondary | ICD-10-CM

## 2022-12-08 DIAGNOSIS — Z95810 Presence of automatic (implantable) cardiac defibrillator: Secondary | ICD-10-CM | POA: Insufficient documentation

## 2022-12-08 DIAGNOSIS — I5022 Chronic systolic (congestive) heart failure: Secondary | ICD-10-CM | POA: Insufficient documentation

## 2022-12-08 DIAGNOSIS — E1169 Type 2 diabetes mellitus with other specified complication: Secondary | ICD-10-CM

## 2022-12-08 MED ORDER — LANTUS SOLOSTAR 100 UNIT/ML ~~LOC~~ SOPN
30.0000 [IU] | PEN_INJECTOR | Freq: Every day | SUBCUTANEOUS | 6 refills | Status: DC
Start: 2022-12-08 — End: 2022-12-12

## 2022-12-08 MED ORDER — FUROSEMIDE 80 MG PO TABS
80.0000 mg | ORAL_TABLET | Freq: Every day | ORAL | 1 refills | Status: DC | PRN
Start: 2022-12-08 — End: 2023-02-23

## 2022-12-10 ENCOUNTER — Encounter (HOSPITAL_COMMUNITY): Payer: Medicaid Other

## 2022-12-10 NOTE — Progress Notes (Signed)
EPIC Encounter for ICM Monitoring  Patient Name: [name] is a 57 y.o. male Date: 12/10/2022 Primary Care Physican: Marcine Matar, MD Primary Cardiologist: Crenshaw/[name] Electrophysiologist: Lalla Brothers 06/17/2022 Office Weight: 251 lbs 08/04/2022 Weight: 271 lbs 08/12/2022 Weight: 271 lbs 09/22/2022 Weight: 281 lbs 12/03/2022 Weight: 282 lbs                            Spoke with patient and heart failure questions reviewed.  Transmission results reviewed.  Pt reports swelling in stomach continues despite losing fluid accumulation.Marland Kitchen  He reports that Trulicity may be causing swelling and will discuss with endocrinologist at upcoming appt.          Corvue thoracic impedance suggesting fluid levels returned to normal.   Prescribed dosage:  Furosemide 80 mg take 1 tablet (80 mg total) as needed daily for fluid.   Pt is taking 04/21/2022 prescription from Dr Jens Som for 80 mg twice a day which is not the current script on file in EPIC. Spironolactone 25 mg take 1 tablet (0.5 mg total) by mouth once a day.    Labs: 10/28/2022 Creatinine 2.56, BUN 30, Potassium 3.7, Sodium 139, GFR 28 08/05/2022 Creatinine 2.14, BUN 22, Potassium 4.4, Sodium 141, GFR 35 06/16/2022 Creatinine 2.07, BUN 23, Potassium 4.1, Sodium 138, GFR 37  06/15/2022 Creatinine 2.33, BUN 25, Potassium 4.1, Sodium 137, GFR 32  06/14/2022 Creatinine 2.65, BUN 19, Potassium 4.4, Sodium 136, GFR 27  A complete set of results can be found in Results Review.   Recommendations:  No changes and encouraged to call if experiencing any fluid symptoms.   Follow-up plan: ICM clinic phone appointment on 01/05/2023.   91 day device clinic remote transmission 02/18/2023.     EP/Cardiology Office Visits:  01/23/2023 with Dr Jens Som.  02/25/2023 with Dr Lalla Brothers.  Recall 02/21/2023 with Dr Gasper Lloyd.   Copy of ICM check sent to Dr Lalla Brothers.    3 month ICM trend: 12/08/2022.    12-14 Month ICM trend:     Karie Soda,  RN 12/10/2022 2:59 PM

## 2022-12-12 ENCOUNTER — Telehealth: Payer: Self-pay | Admitting: Pulmonary Disease

## 2022-12-12 ENCOUNTER — Encounter (HOSPITAL_COMMUNITY): Payer: Medicaid Other

## 2022-12-12 ENCOUNTER — Ambulatory Visit (INDEPENDENT_AMBULATORY_CARE_PROVIDER_SITE_OTHER): Payer: Medicaid Other | Admitting: Internal Medicine

## 2022-12-12 ENCOUNTER — Encounter: Payer: Self-pay | Admitting: Internal Medicine

## 2022-12-12 VITALS — BP 128/64 | HR 75 | Ht 72.0 in | Wt 286.0 lb

## 2022-12-12 DIAGNOSIS — E669 Obesity, unspecified: Secondary | ICD-10-CM | POA: Diagnosis not present

## 2022-12-12 DIAGNOSIS — G4730 Sleep apnea, unspecified: Secondary | ICD-10-CM

## 2022-12-12 DIAGNOSIS — E1159 Type 2 diabetes mellitus with other circulatory complications: Secondary | ICD-10-CM

## 2022-12-12 DIAGNOSIS — E785 Hyperlipidemia, unspecified: Secondary | ICD-10-CM

## 2022-12-12 DIAGNOSIS — E1165 Type 2 diabetes mellitus with hyperglycemia: Secondary | ICD-10-CM | POA: Diagnosis not present

## 2022-12-12 DIAGNOSIS — Z794 Long term (current) use of insulin: Secondary | ICD-10-CM | POA: Diagnosis not present

## 2022-12-12 DIAGNOSIS — E119 Type 2 diabetes mellitus without complications: Secondary | ICD-10-CM

## 2022-12-12 DIAGNOSIS — E1169 Type 2 diabetes mellitus with other specified complication: Secondary | ICD-10-CM

## 2022-12-12 DIAGNOSIS — Z7984 Long term (current) use of oral hypoglycemic drugs: Secondary | ICD-10-CM

## 2022-12-12 DIAGNOSIS — G4733 Obstructive sleep apnea (adult) (pediatric): Secondary | ICD-10-CM | POA: Diagnosis not present

## 2022-12-12 LAB — HEMOGLOBIN A1C: Hemoglobin A1C: 8.9

## 2022-12-12 MED ORDER — LANTUS SOLOSTAR 100 UNIT/ML ~~LOC~~ SOPN
36.0000 [IU] | PEN_INJECTOR | Freq: Every day | SUBCUTANEOUS | 3 refills | Status: DC
Start: 2022-12-12 — End: 2023-04-07

## 2022-12-12 MED ORDER — NOVOLOG FLEXPEN 100 UNIT/ML ~~LOC~~ SOPN
PEN_INJECTOR | SUBCUTANEOUS | 3 refills | Status: DC
Start: 2022-12-12 — End: 2023-02-23

## 2022-12-12 NOTE — Telephone Encounter (Signed)
Call patient  Sleep study result  Date of study: 11/05/2022  Impression: Severe obstructive sleep apnea with severe oxygen desaturations  Saturations was borderline low for most of the study and may suggest presence of underlying cardiorespiratory disease  Recommendation:  Recommend in lab titration study as patient may need oxygen supplementation with CPAP therapy  Encourage weight loss measures  Close clinical follow-up for optimization of treatment

## 2022-12-12 NOTE — Patient Instructions (Addendum)
Please continue: - Farxiga 10 mg before breakfast  Increase: - Lantus 36 units at bedtime - Novolog 14-18 units 2x a day, before meals  NO EATING IN THE MIDDLE OF THE NIGHT!!!  NO SWEET DRINKS!  Please return for another visit in 3-4 months.

## 2022-12-12 NOTE — Progress Notes (Signed)
Patient ID: [name], male   DOB: 09-Aug-1965, 57 y.o.   MRN: [id number]  HPI: [name] is a 57 y.o.-year-old male, initially referred by his PCP, Dr. Laural Benes, returning for follow-up for DM2, dx in 2010-15, insulin-dependent since 2023, uncontrolled, with complications (nonischemic cardiomyopathy, CHF with reduced ejection fraction, history of ventricular tachycardia, history of cardiac arrest, s/p ICD placement, stage IIIb CKD).  Last visit 3 months ago.  Interim history: At last visit, he mentions that since 04/2022, after his cardiac arrest, he has been SOB and has gained 30 lbs.  He also has anxiety and PTSD.  All the symptoms persist now.  He gained approximately 5 more pounds since then. He was on steroids since last OV >> sugars higher >> we increased his NovoLog dose.  He recently saw some low blood sugars and just yesterday he started to decrease the NovoLog doses. Stopped sweet tea since last visit.  Reviewed HbA1c: Lab Results  Component Value Date   HGBA1C 7.9 (A) 09/18/2022   HGBA1C 10.2 (H) 05/04/2022   HGBA1C 15.0 (A) 01/30/2022   HGBA1C 13.0 (H) 08/26/2021   HGBA1C 8.9 (A) 03/19/2021   HGBA1C 8.2 (H) 07/23/2020   HGBA1C 8.7 (A) 04/09/2020   HGBA1C 7.5 (A) 10/24/2019   HGBA1C 6.7 03/24/2019   HGBA1C 7.0 (H) 10/13/2018   Pt is on a regimen of: - Farxiga 10 mg before breakfast - Lantus 22 >> 26 >> 30 units at bedtime - Novolog 12 >> 8-12 >> 28 (steroids) >> 16 units 2x a day, before meals He reports diarrhea after increased Trulicity >> hypokalemia >> cardiac arrest - was in ICU for 16 days, had heart surgery >> now PTSD.  Pt checks his sugars >4x a day  - with the CGM receiver:  Previously:  Lowest sugar was 69 >> 69; he has hypoglycemia awareness at 70.  Highest sugar was 300 (forgot insulin) >> 340 (steroids).  Glucometer: Accu-Chek guide  Pt's meals are: - Breakfast: muffins, 3 eggs - Lunch: skips - Dinner: salad from [employer] - tuna, onions, olives,  crackers + cookie - Snacks:Previously also drinking regular sodas and sweet tea, which she stopped.  - no CKD, last BUN/creatinine:  Lab Results  Component Value Date   BUN 22 (H) 08/05/2022   BUN 29 (H) 07/23/2022   CREATININE 2.14 (H) 08/05/2022   CREATININE 2.20 (H) 07/23/2022  On Farxiga 10 mg daily, Entresto.  -+ HL; last set of lipids: Lab Results  Component Value Date   CHOL 178 08/26/2021   HDL 42 08/26/2021   LDLCALC 81 08/26/2021   TRIG 336 (H) 08/26/2021   CHOLHDL 4.2 08/26/2021  On Lipitor 10 mg daily.  - last eye exam was on 09/23/2022. No DR. Sees Dr. Dione Booze.  - no numbness and tingling in his feet. On Neurontin 300 mg at bedtime.  Last foot exam 09/2022.  Pt has FH of DM in mother and father.  He also has a history of HTN, kidney stones, GERD, IBS, gout, chronic low back pain (spondylosis and spinal stenosis), PTSD/history of suicidal ideation.  He is on amiodarone, ASA 81.  ROS: + See HPI  Past Medical History:  Diagnosis Date   AICD (automatic cardioverter/defibrillator) present    Chronic bronchitis (HCC)    "get it most q yr" (12/23/2013)   Chronic systolic (congestive) heart failure (HCC)    Fracture of left humerus    a. 07/2013.   GERD (gastroesophageal reflux disease)    not at  present time   Heart murmur    "born w/it"    History of renal calculi    Hypertension    Kidney stones    NICM (nonischemic cardiomyopathy) (HCC)    a. 07/2013 Echo: EF 20-25%, diff HK, Gr2 DD, mild MR, mod dil LA/RA. 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    Shockable heart rhythm detected by automated external defibrillator    Syncope    Type II diabetes mellitus (HCC) 2006   Past Surgical History:  Procedure Laterality Date   CARDIAC CATHETERIZATION  08/10/13   CHOLECYSTECTOMY  ~ 2010   IMPLANTABLE CARDIOVERTER DEFIBRILLATOR IMPLANT  12/23/2013   STJ Fortify ICD implanted by Dr Johney Frame  for cardiomyopathy and syncope   IMPLANTABLE CARDIOVERTER DEFIBRILLATOR IMPLANT N/A 12/23/2013   Procedure: IMPLANTABLE CARDIOVERTER DEFIBRILLATOR IMPLANT;  Surgeon: Gardiner Rhyme, MD;  Location: [facility] CATH LAB;  Service: Cardiovascular;  Laterality: N/A;   INGUINAL HERNIA REPAIR Left 03/01/2018   Procedure: OPEN REPAIR OF LEFT INGUINAL HERNIA WITH MESH;  Surgeon: Gaynelle Adu, MD;  Location: [facility] ORS;  Service: General;  Laterality: Left;   LEAD REVISION/REPAIR N/A 05/15/2022   Procedure: LEAD REVISION/REPAIR;  Surgeon: Lanier Prude, MD;  Location: [facility] INVASIVE CV LAB;  Service: Cardiovascular;  Laterality: N/A;   LEFT HEART CATHETERIZATION WITH CORONARY ANGIOGRAM N/A 08/10/2013   Procedure: LEFT HEART CATHETERIZATION WITH CORONARY ANGIOGRAM;  Surgeon: Corky Crafts, MD;  Location: [facility] CATH LAB;  Service: Cardiovascular;  Laterality: N/A;   ORIF HUMERUS FRACTURE Left 08/12/2013   Procedure: OPEN REDUCTION INTERNAL FIXATION (ORIF) HUMERAL SHAFT FRACTURE;  Surgeon: Nadara Mustard, MD;  Location: [facility] OR;  Service: Orthopedics;  Laterality: Left;  Open Reduction Internal Fixation Left Humerus   RIGHT/LEFT HEART CATH AND CORONARY ANGIOGRAPHY N/A 05/12/2022   Procedure: RIGHT/LEFT HEART CATH AND CORONARY ANGIOGRAPHY;  Surgeon: Dorthula Nettles, DO;  Location: [facility] INVASIVE CV LAB;  Service: Cardiovascular;  Laterality: N/A;   URETEROSCOPY     "laser for kidney stones"   Social History   Socioeconomic History   Marital status: Single    Spouse name: Not on file   Number of children: Not on file   Years of education: Not on file   Highest education level: Not on file  Occupational History   Occupation: Disabled  Tobacco Use   Smoking status: Former    Packs/day: 1.00    Years: 28.00    Additional pack years: 0.00    Total pack years: 28.00    Types: Cigarettes    Quit date: 06/03/2014    Years since quitting: 8.5   Smokeless tobacco: Never  Vaping Use   Vaping Use: Never used  Substance and  Sexual Activity   Alcohol use: Yes   Drug use: Yes    Types: Marijuana    Comment: occasionally   Sexual activity: Yes  Other Topics Concern   Not on file  Social History Narrative   Pt lives with his brother    Social Determinants of Health   Financial Resource Strain: High Risk (05/08/2022)   Overall Financial Resource Strain (CARDIA)    Difficulty of Paying Living Expenses: Very hard  Food Insecurity: No Food Insecurity (05/14/2022)   Hunger Vital Sign    Worried About Running Out of Food in the Last Year: Never true    Ran Out of Food in the Last Year: Never true  Transportation Needs: No  Transportation Needs (05/06/2022)   PRAPARE - Administrator, Civil Service (Medical): No    Lack of Transportation (Non-Medical): No  Physical Activity: Not on file  Stress: Not on file  Social Connections: Moderately Integrated (03/12/2021)   Social Connection and Isolation Panel [NHANES]    Frequency of Communication with Friends and Family: More than three times a week    Frequency of Social Gatherings with Friends and Family: More than three times a week    Attends Religious Services: More than 4 times per year    Active Member of [employer] or Organizations: Yes    Attends Banker Meetings: Never    Marital Status: Never married  Intimate Partner Violence: Not At Risk (05/06/2022)   Humiliation, Afraid, Rape, and Kick questionnaire    Fear of Current or Ex-Partner: No    Emotionally Abused: No    Physically Abused: No    Sexually Abused: No   Current Outpatient Medications on File Prior to Visit  Medication Sig Dispense Refill   Accu-Chek Softclix Lancets lancets Use as instructed 100 each 12   acetaminophen (TYLENOL) 500 MG tablet Take 1,000 mg by mouth 2 (two) times daily.     albuterol (VENTOLIN HFA) 108 (90 Base) MCG/ACT inhaler Inhale 2 puffs into the lungs every 6 (six) hours as needed for wheezing or shortness of breath. 18 g 2   amiodarone (PACERONE)  200 MG tablet Take 1 tablet (200 mg total) by mouth daily. 90 tablet 3   aspirin 81 MG tablet Take 81 mg by mouth daily.     atorvastatin (LIPITOR) 10 MG tablet Take 1 tablet (10 mg total) by mouth daily. 90 tablet 1   benzonatate (TESSALON) 100 MG capsule Take 1 capsule (100 mg total) by mouth 2 (two) times daily as needed for cough. 20 capsule 0   Blood Glucose Monitoring Suppl (ACCU-CHEK GUIDE) w/Device KIT UAD 1 kit 0   cetirizine (EQ ALLERGY RELIEF, CETIRIZINE,) 10 MG tablet Take 1 tablet (10 mg total) by mouth daily. 90 tablet 1   cholecalciferol (VITAMIN D3) 25 MCG (1000 UNIT) tablet Take 1,000 Units by mouth daily.     clobetasol cream (TEMOVATE) 0.05 % Apply 1 Application topically as needed.     [START ON 12/19/2022] clonazePAM (KLONOPIN) 0.5 MG tablet Take 1 tablet (0.5 mg total) by mouth in the morning and at bedtime. 60 tablet 0   Continuous Blood Gluc Sensor (FREESTYLE LIBRE 2 SENSOR) MISC Use to check blood sugar TID. Change sensor once every 14 days. E11.65 2 each 11   dapagliflozin propanediol (FARXIGA) 10 MG TABS tablet Take 1 tablet (10 mg total) by mouth daily before breakfast. 90 tablet 3   EPINEPHrine 0.3 mg/0.3 mL IJ SOAJ injection Inject 0.3 mg into the muscle as needed. 1 each 1   famotidine (PEPCID) 20 MG tablet Take 1 tablet by mouth 1-2 times a day as needed. 90 tablet 1   fluticasone (FLONASE) 50 MCG/ACT nasal spray Use 1-2 sprays in each nostril once a day as needed for nasal congestion. 16 g 5   fluticasone (FLOVENT HFA) 44 MCG/ACT inhaler Inhale 2 puffs into the lungs in the morning and at bedtime. 1 each 12   furosemide (LASIX) 80 MG tablet Take 1 tablet (80 mg total) by mouth daily as needed for edema or fluid. 180 tablet 1   gabapentin (NEURONTIN) 300 MG capsule Take 1 capsule by mouth at bedtime 90 capsule 1   glucose blood (ACCU-CHEK  GUIDE) test strip Use as instructed 100 each 12   glucose blood (FREESTYLE LITE) test strip Use as instructed.  Please make sure  this is compatible with [facility] 2 reader. 100 each 12   insulin aspart (NOVOLOG FLEXPEN) 100 UNIT/ML FlexPen Inject 26 units twice a day before meals 15 mL 1   insulin glargine (LANTUS SOLOSTAR) 100 UNIT/ML Solostar Pen Inject 30 Units into the skin daily. 15 mL 6   Insulin Pen Needle (PEN NEEDLES) 31G X 8 MM MISC UAD 100 each 6   ipratropium-albuterol (DUONEB) 0.5-2.5 (3) MG/3ML SOLN Take 3 mLs by nebulization every 6 (six) hours as needed. 360 mL 2   Lancet Devices (ACCU-CHEK SOFTCLIX) lancets 1 each by Other route 3 (three) times daily. 1 each 0   methocarbamol (ROBAXIN) 500 MG tablet TAKE 1 TABLET BY MOUTH ONCE DAILY AS NEEDED FOR MUSCLE SPASM 30 tablet 2   metoprolol succinate (TOPROL-XL) 50 MG 24 hr tablet Take 1 tablet (50 mg total) by mouth daily. 90 tablet 3   predniSONE (DELTASONE) 10 MG tablet 4 tabs for 2 days, then 3 tabs for 2 days, 2 tabs for 2 days, then 1 tab for 2 days, then stop 20 tablet 0   sacubitril-valsartan (ENTRESTO) 24-26 MG Take 2 tablets by mouth 2 (two) times daily. Disregard previous refill requests. This is correct sig - 2 tablets twice daily 60 tablet 6   sertraline (ZOLOFT) 25 MG tablet Take 1 tablet (25 mg total) by mouth daily. 30 tablet 2   sildenafil (VIAGRA) 100 MG tablet Take 1-2 tabs PO 1/2-1 hr prior to intercourse. 20 tablet 4   sodium chloride (OCEAN) 0.65 % SOLN nasal spray Place 1 spray into both nostrils as needed for congestion. 104 mL 0   spironolactone (ALDACTONE) 25 MG tablet Take 0.5 tablets (12.5 mg total) by mouth daily. 30 tablet 5   tamsulosin (FLOMAX) 0.4 MG CAPS capsule Take 2 capsules (0.8 mg total) by mouth daily. 180 capsule 1   triamcinolone cream (KENALOG) 0.1 % Apply sparingly to red itchy areas twice daily as needed. Avoid face, neck, armpits or groin area. Do not use more than 3 weeks in a row. 45 g 5   No current facility-administered medications on file prior to visit.   Allergies  Allergen Reactions   Bee Venom Anaphylaxis    Trulicity [Dulaglutide] Diarrhea    Extra dose of Trulicity caused cardiac arrest. Patient experience onset of diarrhea with using this medication - cleared after med stopped.   Family History  Problem Relation Age of Onset   Diabetes Father    Arthritis Father    Hypertension Father    Diabetes Mother    Arthritis Mother    Hypertension Mother    Hypertension Brother    PE: BP 128/64   Pulse 75   Ht 6' (1.829 m)   Wt 286 lb (129.7 kg)   SpO2 94%   BMI 38.79 kg/m  Wt Readings from Last 3 Encounters:  12/12/22 286 lb (129.7 kg)  11/28/22 287 lb 3.2 oz (130.3 kg)  10/30/22 285 lb 3.2 oz (129.4 kg)   Constitutional: overweight, in NAD Eyes:  EOMI, no exophthalmos ENT: no neck masses, no cervical lymphadenopathy Cardiovascular: RRR, No MRG Respiratory: CTA B Musculoskeletal: no deformities Skin:no rashes Neurological: no tremor with outstretched hands  ASSESSMENT: 1. DM2, insulin-dependent, uncontrolled, with long-term complications: - NICM - CHF with reduced ejection fraction-20 to 25% 08/2022 - history of VT + cardiac arrest 04/2022 (after  developing diarrhea from increasing Trulicity dose), s/p ICD placement - stage IIIb CKD  PLAN:  1. Patient with longstanding, uncontrolled, type 2 diabetes, on oral regimen with SGLT2 inhibitor and also basal/bolus insulin regimen, with improvement in HbA1c after addition of insulin in 2023.  HbA1c at last visit was lower, at 7.9%.  At that time, reviewing the CGM trends, sugars are fluctuating in the upper half of the target range, mostly around 180 mg/dL mark.  They were spiking at night, though, and upon questioning, this was likely due to having a snack around 1 AM: 4 cookies.  He was also drinking regular cranberry juice during the day.  I strongly advised him to stop any sweet drinks and also to stop eating at night.  We increased the Lantus dose slightly and I did advise him to vary the dose of mealtime insulin based on the size of  the meal, as he was taking fixed doses.  We continued [place].  He was intolerant to GLP-1 receptor agonist in the past (see HPI). CGM interpretation: -At today's visit, we reviewed his CGM downloads: It appears that 55% of values are in target range (goal >70%), while 45% are higher than 180 (goal <25%), and 0% are lower than 70 (goal <4%).  The calculated average blood sugar is 182.  The projected HbA1c for the next 3 months (GMI) is 7.7%. -Reviewing the CGM trends, sugars appear to be fluctuating within the more narrow range around the upper limit of the target interval, 180.  He did have 1 low blood sugar overnight 2 days ago, and afterwards he started to reduce his NovoLog dose.  I definitely recommended to continue with lower NovoLog doses since he is now off the steroids.  However, since sugars are too high you usually throughout the day and night, I advised him to try to increase his Lantus dose.  I also advised him to try to vary the dose of NovoLog based on the size of his meals.  I again emphasized the need to not eat in the middle of the night.  He tells me he stopped sweet tea and I advised him to try to stay off and not drink any sweet drinks. -We discussed about improving diet.  I did recommend a referral to nutrition, but he would like to hold off this.  He tells me that his cardiologist also recommended nutritional advice and he is trying to get this scheduled through cardiology office.  I advised him that this is a great idea and he should continue with this plan. - I suggested to:  Patient Instructions  Please continue: - Farxiga 10 mg before breakfast  Increase: - Lantus 36 units at bedtime - Novolog 14-18 units 2x a day, before meals  NO EATING IN THE MIDDLE OF THE NIGHT!!!  NO SWEET DRINKS!  Please return for another visit in 3-4 months.   - we checked his HbA1c: 8.9% (higher) - advised to check sugars at different times of the day - 4x a day, rotating check times -  advised for yearly eye exams >> he is UTD - return to clinic in 3-4 months  2. HL -Reviewed latest lipid panel from 08/2021: LDL above our target of less than 55, triglycerides elevated: Lab Results  Component Value Date   CHOL 178 08/26/2021   HDL 42 08/26/2021   LDLCALC 81 08/26/2021   TRIG 336 (H) 08/26/2021   CHOLHDL 4.2 08/26/2021  -He is taking Lipitor 10 mg daily without  side effects -He is due for another lipid panel -has an appointment with PCP coming up   Carlus Pavlov, MD PhD [facility] Endocrinology

## 2022-12-15 ENCOUNTER — Encounter (HOSPITAL_COMMUNITY): Payer: Medicaid Other

## 2022-12-15 NOTE — Telephone Encounter (Signed)
HST showed severe OSA with severe oxygen desaturation. I am ordering CPAP titraiton study to assess for correct pressure settings and oxygen needs. Please let patient know and needs a follow-up in 6-8 weeks

## 2022-12-15 NOTE — Addendum Note (Signed)
Addended by: Glenford Bayley on: 12/15/2022 02:46 PM   Modules accepted: Orders

## 2022-12-15 NOTE — Telephone Encounter (Signed)
Left message on VM for patient to call to review HST results per [facility].  Order for CPAP titration study already in EPIC per [place].  Patient will need follow up in 6-8 weeks.

## 2022-12-16 NOTE — Telephone Encounter (Signed)
Called patient x 2.  Gave patient all information.  Informed patient order was placed for CPAP titration study for assessment of correct pressure settings and oxygen needs.  Patient will call back to schedule a follow up in 6-8 weeks after the study.  Patient verbalized understanding.  Nothing further needed at this time.

## 2022-12-16 NOTE — Progress Notes (Signed)
Remote ICD transmission.   

## 2022-12-17 ENCOUNTER — Encounter (HOSPITAL_COMMUNITY): Payer: Medicaid Other

## 2022-12-19 ENCOUNTER — Ambulatory Visit: Payer: Medicaid Other | Admitting: Internal Medicine

## 2022-12-19 ENCOUNTER — Encounter (HOSPITAL_COMMUNITY): Payer: Medicaid Other

## 2022-12-23 ENCOUNTER — Encounter: Payer: Self-pay | Admitting: Internal Medicine

## 2022-12-23 ENCOUNTER — Telehealth: Payer: Self-pay

## 2022-12-23 DIAGNOSIS — E1169 Type 2 diabetes mellitus with other specified complication: Secondary | ICD-10-CM

## 2022-12-23 MED ORDER — LANCET DEVICE MISC: 1.0000 | Freq: Three times a day (TID) | 0 refills | Status: AC

## 2022-12-23 MED ORDER — BLOOD GLUCOSE MONITORING SUPPL DEVI: 1.0000 | Freq: Three times a day (TID) | 0 refills | Status: DC

## 2022-12-23 MED ORDER — LANCETS MISC. MISC: 1.0000 | Freq: Three times a day (TID) | 0 refills | Status: AC

## 2022-12-23 MED ORDER — BLOOD GLUCOSE TEST VI STRP: 1.0000 | ORAL_STRIP | Freq: Four times a day (QID) | 0 refills | Status: AC

## 2022-12-23 NOTE — Telephone Encounter (Signed)
 [name], CMA  ?

## 2022-12-24 ENCOUNTER — Ambulatory Visit
Admission: RE | Admit: 2022-12-24 | Discharge: 2022-12-24 | Disposition: A | Discharge status: Home / Self Care | Payer: Medicaid Other | Source: Ambulatory Visit | Attending: Internal Medicine | Admitting: Internal Medicine

## 2022-12-24 DIAGNOSIS — J439 Emphysema, unspecified: Secondary | ICD-10-CM

## 2022-12-24 DIAGNOSIS — J4489 Other specified chronic obstructive pulmonary disease: Secondary | ICD-10-CM

## 2022-12-24 DIAGNOSIS — Z0389 Encounter for observation for other suspected diseases and conditions ruled out: Secondary | ICD-10-CM | POA: Diagnosis not present

## 2022-12-24 DIAGNOSIS — F411 Generalized anxiety disorder: Secondary | ICD-10-CM | POA: Diagnosis not present

## 2023-01-05 ENCOUNTER — Encounter (INDEPENDENT_AMBULATORY_CARE_PROVIDER_SITE_OTHER): Payer: Medicaid Other

## 2023-01-05 DIAGNOSIS — I5042 Chronic combined systolic (congestive) and diastolic (congestive) heart failure: Secondary | ICD-10-CM | POA: Diagnosis not present

## 2023-01-05 DIAGNOSIS — Z95810 Presence of automatic (implantable) cardiac defibrillator: Secondary | ICD-10-CM

## 2023-01-06 DIAGNOSIS — F4312 Post-traumatic stress disorder, chronic: Secondary | ICD-10-CM | POA: Diagnosis not present

## 2023-01-06 DIAGNOSIS — F411 Generalized anxiety disorder: Secondary | ICD-10-CM | POA: Diagnosis not present

## 2023-01-07 ENCOUNTER — Other Ambulatory Visit (HOSPITAL_COMMUNITY): Payer: Self-pay

## 2023-01-07 MED ORDER — ENTRESTO 24-26 MG PO TABS: 1.0000 | ORAL_TABLET | Freq: Two times a day (BID) | ORAL | 5 refills | Status: DC

## 2023-01-07 NOTE — Progress Notes (Signed)
EPIC Encounter for ICM Monitoring  Patient Name: [name] is a 57 y.o. male Date: 01/07/2023 Primary Care Physican: Marcine Matar, MD Primary Cardiologist: Crenshaw/[name] Electrophysiologist: Lalla Brothers 06/17/2022 Office Weight: 251 lbs 08/04/2022 Weight: 271 lbs 08/12/2022 Weight: 271 lbs 09/22/2022 Weight: 281 lbs 12/03/2022 Weight: 282 lbs 01/07/2023 Weight: 280 lbs                            Spoke with patient and heart failure questions reviewed.  Transmission results reviewed.  Pt reports swelling in stomach and thinks that Trulicity may be causing swelling and will discuss with endocrinologist.          [place] thoracic impedance suggesting normal fluid levels.   Prescribed dosage:  Furosemide 80 mg take 1 tablet (80 mg total) as needed daily for fluid.   Pt is taking 04/21/2022 prescription from Dr Jens Som for 80 mg twice a day which is not the current script on file in EPIC. Spironolactone 25 mg take 1 tablet (0.5 mg total) by mouth once a day.    Labs: 10/28/2022 Creatinine 2.56, BUN 30, Potassium 3.7, Sodium 139, GFR 28 08/05/2022 Creatinine 2.14, BUN 22, Potassium 4.4, Sodium 141, GFR 35 06/16/2022 Creatinine 2.07, BUN 23, Potassium 4.1, Sodium 138, GFR 37  06/15/2022 Creatinine 2.33, BUN 25, Potassium 4.1, Sodium 137, GFR 32  06/14/2022 Creatinine 2.65, BUN 19, Potassium 4.4, Sodium 136, GFR 27  A complete set of results can be found in Results Review.   Recommendations:  No changes and encouraged to call if experiencing any fluid symptoms.   Follow-up plan: ICM clinic phone appointment on 02/10/2023.   91 day device clinic remote transmission 02/18/2023.     EP/Cardiology Office Visits:  01/23/2023 with Dr Jens Som.  02/25/2023 with Dr Lalla Brothers.  Recall 02/21/2023 with Dr Gasper Lloyd.   Copy of ICM check sent to Dr Lalla Brothers.    3 month ICM trend: 01/05/2023.    12-14 Month ICM trend:     Karie Soda, RN 01/07/2023 2:34 PM

## 2023-01-08 ENCOUNTER — Encounter: Payer: Self-pay | Admitting: Internal Medicine

## 2023-01-09 ENCOUNTER — Other Ambulatory Visit: Payer: Self-pay | Admitting: Internal Medicine

## 2023-01-09 MED ORDER — METHOCARBAMOL 500 MG PO TABS: ORAL_TABLET | ORAL | 2 refills | Status: DC
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
## 2023-01-13 ENCOUNTER — Ambulatory Visit (HOSPITAL_BASED_OUTPATIENT_CLINIC_OR_DEPARTMENT_OTHER): Payer: Medicaid Other | Admitting: Psychiatry

## 2023-01-13 ENCOUNTER — Encounter (HOSPITAL_COMMUNITY): Payer: Self-pay | Admitting: Psychiatry

## 2023-01-13 ENCOUNTER — Other Ambulatory Visit: Payer: Self-pay

## 2023-01-13 VITALS — BP 127/80 | HR 79 | Ht 72.0 in | Wt 300.0 lb

## 2023-01-13 DIAGNOSIS — F411 Generalized anxiety disorder: Secondary | ICD-10-CM | POA: Diagnosis not present

## 2023-01-13 DIAGNOSIS — F410 Panic disorder [episodic paroxysmal anxiety] without agoraphobia: Secondary | ICD-10-CM

## 2023-01-13 DIAGNOSIS — F4310 Post-traumatic stress disorder, unspecified: Secondary | ICD-10-CM | POA: Diagnosis not present

## 2023-01-13 MED ORDER — CLONAZEPAM 0.5 MG PO TABS
ORAL_TABLET | ORAL | 2 refills | Status: DC
Start: 2023-01-13 — End: 2023-02-17

## 2023-01-13 MED ORDER — SERTRALINE HCL 25 MG PO TABS
25.0000 mg | ORAL_TABLET | Freq: Every day | ORAL | 0 refills | Status: DC
Start: 2023-01-13 — End: 2023-03-17

## 2023-01-15 NOTE — Progress Notes (Deleted)
HPI: FU CM/CHF. Echo 2/15 revealed EF of 20-25% with diffuse hypokinesis and biatrial enlargement, mild MR and a small pericardial effusion. Cardiac catheterization in March of 2015 showed normal coronary arteries. Also with prior ICD. CPX August 2017 showed submaximal effort with moderate functional impairment primarily circulatory limited.  Patient was admitted November 2023 with ventricular tachycardia.  He underwent placement of a right atrial lead with improvement in ectopy after atrial pacing.  Cardiac catheterization December 2023 showed low filling pressures, normal mean PA pressure and no obstructive coronary artery disease.  Last echocardiogram March 2024 showed ejection fraction 20 to 25%, moderate left ventricular hypertrophy, grade 1 diastolic dysfunction.  Since last seen,   Current Outpatient Medications  Medication Sig Dispense Refill   Accu-Chek Softclix Lancets lancets Use as instructed 100 each 12   acetaminophen (TYLENOL) 500 MG tablet Take 1,000 mg by mouth 2 (two) times daily.     albuterol (VENTOLIN HFA) 108 (90 Base) MCG/ACT inhaler Inhale 2 puffs into the lungs every 6 (six) hours as needed for wheezing or shortness of breath. 18 g 2   amiodarone (PACERONE) 200 MG tablet Take 1 tablet (200 mg total) by mouth daily. 90 tablet 3   aspirin 81 MG tablet Take 81 mg by mouth daily.     atorvastatin (LIPITOR) 10 MG tablet Take 1 tablet (10 mg total) by mouth daily. 90 tablet 1   benzonatate (TESSALON) 100 MG capsule Take 1 capsule (100 mg total) by mouth 2 (two) times daily as needed for cough. 20 capsule 0   Blood Glucose Monitoring Suppl (ACCU-CHEK GUIDE) w/Device KIT UAD 1 kit 0   Blood Glucose Monitoring Suppl DEVI 1 each by Does not apply route in the morning, at noon, and at bedtime. May substitute to any manufacturer covered by patient's insurance. 1 each 0   cetirizine (EQ ALLERGY RELIEF, CETIRIZINE,) 10 MG tablet Take 1 tablet (10 mg total) by mouth daily. 90 tablet 1    cholecalciferol (VITAMIN D3) 25 MCG (1000 UNIT) tablet Take 1,000 Units by mouth daily.     clobetasol cream (TEMOVATE) 0.05 % Apply 1 Application topically as needed.     clonazePAM (KLONOPIN) 0.5 MG tablet Take 0.5 tablets (0.25 mg total) by mouth daily AND 1 tablet (0.5 mg total) at bedtime. 45 tablet 2   Continuous Blood Gluc Sensor (FREESTYLE LIBRE 2 SENSOR) MISC Use to check blood sugar TID. Change sensor once every 14 days. E11.65 2 each 11   dapagliflozin propanediol (FARXIGA) 10 MG TABS tablet Take 1 tablet (10 mg total) by mouth daily before breakfast. 90 tablet 3   EPINEPHrine 0.3 mg/0.3 mL IJ SOAJ injection Inject 0.3 mg into the muscle as needed. 1 each 1   famotidine (PEPCID) 20 MG tablet Take 1 tablet by mouth 1-2 times a day as needed. 90 tablet 1   fluticasone (FLONASE) 50 MCG/ACT nasal spray Use 1-2 sprays in each nostril once a day as needed for nasal congestion. 16 g 5   fluticasone (FLOVENT HFA) 44 MCG/ACT inhaler Inhale 2 puffs into the lungs in the morning and at bedtime. 1 each 12   furosemide (LASIX) 80 MG tablet Take 1 tablet (80 mg total) by mouth daily as needed for edema or fluid. 180 tablet 1   gabapentin (NEURONTIN) 300 MG capsule Take 1 capsule by mouth at bedtime 90 capsule 1   glucose blood (ACCU-CHEK GUIDE) test strip Use as instructed 100 each 12   Glucose Blood (BLOOD GLUCOSE  TEST STRIPS) STRP 1 each by In Vitro route QID. May substitute to any manufacturer covered by patient's insurance. 100 strip 0   glucose blood (FREESTYLE LITE) test strip Use as instructed.  Please make sure this is compatible with [facility] 2 reader. 100 each 12   insulin aspart (NOVOLOG FLEXPEN) 100 UNIT/ML FlexPen Inject 14-18 units 2-3 a day before meals 30 mL 3   insulin glargine (LANTUS SOLOSTAR) 100 UNIT/ML Solostar Pen Inject 36 Units into the skin daily. 30 mL 3   Insulin Pen Needle (PEN NEEDLES) 31G X 8 MM MISC UAD 100 each 6   ipratropium-albuterol (DUONEB) 0.5-2.5 (3) MG/3ML SOLN  Take 3 mLs by nebulization every 6 (six) hours as needed. 360 mL 2   Lancet Device MISC 1 each by Does not apply route in the morning, at noon, and at bedtime. May substitute to any manufacturer covered by patient's insurance. 1 each 0   Lancet Devices (ACCU-CHEK SOFTCLIX) lancets 1 each by Other route 3 (three) times daily. 1 each 0   Lancets Misc. MISC 1 each by Does not apply route in the morning, at noon, and at bedtime. May substitute to any manufacturer covered by patient's insurance. 100 each 0   methocarbamol (ROBAXIN) 500 MG tablet TAKE 1 TABLET BY MOUTH ONCE DAILY AS NEEDED FOR MUSCLE SPASM 30 tablet 2   metoprolol succinate (TOPROL-XL) 50 MG 24 hr tablet Take 1 tablet (50 mg total) by mouth daily. 90 tablet 3   predniSONE (DELTASONE) 10 MG tablet 4 tabs for 2 days, then 3 tabs for 2 days, 2 tabs for 2 days, then 1 tab for 2 days, then stop 20 tablet 0   sacubitril-valsartan (ENTRESTO) 24-26 MG Take 1 tablet by mouth 2 (two) times daily. 60 tablet 5   sertraline (ZOLOFT) 25 MG tablet Take 1 tablet (25 mg total) by mouth daily. 90 tablet 0   sildenafil (VIAGRA) 100 MG tablet Take 1-2 tabs PO 1/2-1 hr prior to intercourse. 20 tablet 4   sodium chloride (OCEAN) 0.65 % SOLN nasal spray Place 1 spray into both nostrils as needed for congestion. 104 mL 0   spironolactone (ALDACTONE) 25 MG tablet Take 0.5 tablets (12.5 mg total) by mouth daily. 30 tablet 5   tamsulosin (FLOMAX) 0.4 MG CAPS capsule Take 2 capsules (0.8 mg total) by mouth daily. 180 capsule 1   triamcinolone cream (KENALOG) 0.1 % Apply sparingly to red itchy areas twice daily as needed. Avoid face, neck, armpits or groin area. Do not use more than 3 weeks in a row. 45 g 5   No current facility-administered medications for this visit.     Past Medical History:  Diagnosis Date   AICD (automatic cardioverter/defibrillator) present    Chronic bronchitis (HCC)    "get it most q yr" (12/23/2013)   Chronic systolic (congestive)  heart failure (HCC)    Fracture of left humerus    a. 07/2013.   GERD (gastroesophageal reflux disease)    not at present time   Heart murmur    "born w/it"    History of renal calculi    Hypertension    Kidney stones    NICM (nonischemic cardiomyopathy) (HCC)    a. 07/2013 Echo: EF 20-25%, diff HK, Gr2 DD, mild MR, mod dil LA/RA. 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    Shockable heart rhythm detected by automated external defibrillator  Syncope    Type II diabetes mellitus (HCC) 2006    Past Surgical History:  Procedure Laterality Date   CARDIAC CATHETERIZATION  08/10/13   CHOLECYSTECTOMY  ~ 2010   IMPLANTABLE CARDIOVERTER DEFIBRILLATOR IMPLANT  12/23/2013   STJ Fortify ICD implanted by Dr Johney Frame for cardiomyopathy and syncope   IMPLANTABLE CARDIOVERTER DEFIBRILLATOR IMPLANT N/A 12/23/2013   Procedure: IMPLANTABLE CARDIOVERTER DEFIBRILLATOR IMPLANT;  Surgeon: Gardiner Rhyme, MD;  [facility] CATH LAB;  Service: Cardiovascular;  Laterality: N/A;   INGUINAL HERNIA REPAIR Left 03/01/2018   Procedure: OPEN REPAIR OF LEFT INGUINAL HERNIA WITH MESH;  Surgeon: Gaynelle Adu, MD;  Location: [facility] ORS;  Service: General;  Laterality: Left;   LEAD REVISION/REPAIR N/A 05/15/2022   Procedure: LEAD REVISION/REPAIR;  Surgeon: Lanier Prude, MD;  Location: [facility] INVASIVE CV LAB;  Service: Cardiovascular;  Laterality: N/A;   LEFT HEART CATHETERIZATION WITH CORONARY ANGIOGRAM N/A 08/10/2013   Procedure: LEFT HEART CATHETERIZATION WITH CORONARY ANGIOGRAM;  Surgeon: Corky Crafts, MD;  Location: [facility] CATH LAB;  Service: Cardiovascular;  Laterality: N/A;   ORIF HUMERUS FRACTURE Left 08/12/2013   Procedure: OPEN REDUCTION INTERNAL FIXATION (ORIF) HUMERAL SHAFT FRACTURE;  Surgeon: Nadara Mustard, MD;  Location: [facility] OR;  Service: Orthopedics;  Laterality: Left;  Open Reduction Internal Fixation Left Humerus   RIGHT/LEFT HEART CATH AND  CORONARY ANGIOGRAPHY N/A 05/12/2022   Procedure: RIGHT/LEFT HEART CATH AND CORONARY ANGIOGRAPHY;  Surgeon: Dorthula Nettles, DO;  Location: [facility] INVASIVE CV LAB;  Service: Cardiovascular;  Laterality: N/A;   URETEROSCOPY     "laser for kidney stones"    Social History   Socioeconomic History   Marital status: Single    Spouse name: Not on file   Number of children: Not on file   Years of education: Not on file   Highest education level: Not on file  Occupational History   Occupation: Disabled  Tobacco Use   Smoking status: Former    Current packs/day: 0.00    Average packs/day: 1 pack/day for 28.0 years (28.0 ttl pk-yrs)    Types: Cigarettes    Start date: 06/03/1986    Quit date: 06/03/2014    Years since quitting: 8.6   Smokeless tobacco: Never  Vaping Use   Vaping status: Never Used  Substance and Sexual Activity   Alcohol use: Yes   Drug use: Yes    Types: Marijuana    Comment: occasionally   Sexual activity: Yes  Other Topics Concern   Not on file  Social History Narrative   Pt lives with his brother    Social Determinants of Health   Financial Resource Strain: High Risk (05/08/2022)   Overall Financial Resource Strain (CARDIA)    Difficulty of Paying Living Expenses: Very hard  Food Insecurity: No Food Insecurity (05/14/2022)   Hunger Vital Sign    Worried About Running Out of Food in the Last Year: Never true    Ran Out of Food in the Last Year: Never true  Transportation Needs: No Transportation Needs (05/06/2022)   PRAPARE - Administrator, Civil Service (Medical): No    Lack of Transportation (Non-Medical): No  Physical Activity: Not on file  Stress: Not on file  Social Connections: Moderately Integrated (03/12/2021)   Social Connection and Isolation Panel [NHANES]    Frequency of Communication with Friends and Family: More than three times a week    Frequency of Social Gatherings with Friends and Family: More than three times a week  Attends Religious Services: More than 4 times per year    Active Member of Clubs or Organizations: Yes    Attends Banker Meetings: Never    Marital Status: Never married  Intimate Partner Violence: Not At Risk (05/06/2022)   Humiliation, Afraid, Rape, and Kick questionnaire    Fear of Current or Ex-Partner: No    Emotionally Abused: No    Physically Abused: No    Sexually Abused: No    Family History  Problem Relation Age of Onset   Diabetes Father    Arthritis Father    Hypertension Father    Diabetes Mother    Arthritis Mother    Hypertension Mother    Hypertension Brother     ROS: no fevers or chills, productive cough, hemoptysis, dysphasia, odynophagia, melena, hematochezia, dysuria, hematuria, rash, seizure activity, orthopnea, PND, pedal edema, claudication. Remaining systems are negative.  Physical Exam: Well-developed well-nourished in no acute distress.  Skin is warm and dry.  HEENT is normal.  Neck is supple.  Chest is clear to auscultation with normal expansion.  Cardiovascular exam is regular rate and rhythm.  Abdominal exam nontender or distended. No masses palpated. Extremities show no edema. neuro grossly intact  ECG- personally reviewed  A/P  1 nonischemic cardiomyopathy-continue present dose of Entresto and carvedilol.  2 chronic systolic congestive heart failure-patient is euvolemic on examination.  Continue Lasix, spironolactone and Farxiga at present dose.  Check potassium and renal function.  3 history of chronic chest pain-recent cardiac catheterization revealed no coronary disease.  4 hyperlipidemia-continue statin.  5 ICD-Per electrophysiology.  6 chronic stage III kidney disease-followed by nephrology.  7 history of ventricular tachycardia-continue amiodarone.  Olga Millers, MD

## 2023-01-23 ENCOUNTER — Telehealth: Payer: Self-pay | Admitting: Cardiology

## 2023-01-23 ENCOUNTER — Ambulatory Visit: Payer: Medicaid Other | Admitting: Cardiology

## 2023-01-23 NOTE — Telephone Encounter (Signed)
Pt c/o swelling/edema: STAT if pt has developed SOB within 24 hours  If swelling, where is the swelling located? Stomach.  He states he is not sure if his feet have any swelling.   How much weight have you gained and in what time span? 60lbs since January  Have you gained 2 pounds in a day or 5 pounds in a week? Patient states both has happened.  Do you have a log of your daily weights (if so, list)? no  Are you currently taking a fluid pill? Yes   Are you currently SOB? Yes, patient states the SOB has always been there.  Patient states it has gotten worse in the past two months.   Have you traveled recently in a car or plane for an extended period of time? No  He states another doctor's office advised him to go to the emergency room to get the extra fluid removed but he can't recall which office, and he doesn't want to wait in the emergency room.  He did have an appt today with Dr. Jens Som, but appt had to be canceled as provider is out sick today.

## 2023-01-23 NOTE — Telephone Encounter (Signed)
Patient states the fluid build up is in the stomach . He states he does not remember which doctor advised ED and do not see any notes pertaining to this.  Based on discussion I have advised to go to ED.  He wants to know how long it will take.  Advised it will depend on the amount of fluid and how slow it needs to be pulled off.  He states understanding and states will go to ED.

## 2023-01-24 ENCOUNTER — Other Ambulatory Visit: Payer: Self-pay | Admitting: Family Medicine

## 2023-01-26 ENCOUNTER — Encounter: Payer: Medicaid Other | Admitting: Podiatry

## 2023-01-26 ENCOUNTER — Encounter: Payer: Self-pay | Admitting: Internal Medicine

## 2023-01-27 ENCOUNTER — Encounter: Payer: Self-pay | Admitting: Cardiology

## 2023-01-27 ENCOUNTER — Other Ambulatory Visit: Payer: Self-pay | Admitting: Internal Medicine

## 2023-01-27 DIAGNOSIS — E1169 Type 2 diabetes mellitus with other specified complication: Secondary | ICD-10-CM

## 2023-01-27 DIAGNOSIS — N138 Other obstructive and reflux uropathy: Secondary | ICD-10-CM

## 2023-01-27 MED ORDER — TAMSULOSIN HCL 0.4 MG PO CAPS
0.8000 mg | ORAL_CAPSULE | Freq: Every day | ORAL | 1 refills | Status: DC
Start: 2023-01-27 — End: 2023-08-19

## 2023-01-27 MED ORDER — ATORVASTATIN CALCIUM 10 MG PO TABS
10.0000 mg | ORAL_TABLET | Freq: Every day | ORAL | 1 refills | Status: DC
Start: 2023-01-27 — End: 2023-08-19

## 2023-01-28 NOTE — Progress Notes (Signed)
Patient was a no-show for today's scheduled appointment.

## 2023-01-30 ENCOUNTER — Ambulatory Visit (HOSPITAL_BASED_OUTPATIENT_CLINIC_OR_DEPARTMENT_OTHER): Payer: Medicaid Other | Attending: Primary Care | Admitting: Pulmonary Disease

## 2023-02-04 DIAGNOSIS — F411 Generalized anxiety disorder: Secondary | ICD-10-CM | POA: Diagnosis not present

## 2023-02-04 DIAGNOSIS — F4312 Post-traumatic stress disorder, chronic: Secondary | ICD-10-CM | POA: Diagnosis not present

## 2023-02-05 ENCOUNTER — Ambulatory Visit (INDEPENDENT_AMBULATORY_CARE_PROVIDER_SITE_OTHER): Payer: Medicaid Other | Admitting: Podiatry

## 2023-02-05 DIAGNOSIS — M79675 Pain in left toe(s): Secondary | ICD-10-CM | POA: Diagnosis not present

## 2023-02-05 DIAGNOSIS — B353 Tinea pedis: Secondary | ICD-10-CM

## 2023-02-05 DIAGNOSIS — M79674 Pain in right toe(s): Secondary | ICD-10-CM | POA: Diagnosis not present

## 2023-02-05 DIAGNOSIS — B351 Tinea unguium: Secondary | ICD-10-CM | POA: Diagnosis not present

## 2023-02-05 DIAGNOSIS — E1142 Type 2 diabetes mellitus with diabetic polyneuropathy: Secondary | ICD-10-CM

## 2023-02-05 MED ORDER — KETOCONAZOLE 2 % EX CREA: 1.0000 | TOPICAL_CREAM | Freq: Every day | CUTANEOUS | 0 refills | Status: DC

## 2023-02-05 NOTE — Progress Notes (Addendum)
  Subjective:  Patient ID: [name], male    DOB: 08/05/1965,  MRN: [id number]  Chief Complaint  Patient presents with   Nail Problem    Diabetic Foot Care-nail trim    Tinea Pedis    Possible athletes feet. Patient complaining of itchiness to bilateral feet. No dryness or skin peeling.     57 y.o. male presents with the above complaint. History confirmed with patient. Patient presenting with pain related to dystrophic thickened elongated nails. Patient is unable to trim own nails related to nail dystrophy and/or mobility issues. Patient does have a history of T2DM.   Objective:  Physical Exam: warm, good capillary refill nail exam onychomycosis of the toenails, onycholysis, and dystrophic nails DP pulses palpable, PT pulses palpable, and protective sensation absent Left Foot:  Pain with palpation of nails due to elongation and dystrophic growth.  Right Foot: Pain with palpation of nails due to elongation and dystrophic growth.   Assessment:   1. Pain due to onychomycosis of toenails of both feet   2. Tinea pedis of both feet   3. Diabetic polyneuropathy associated with type 2 diabetes mellitus (HCC)      Plan:  Patient was evaluated and treated and all questions answered.  # Tinea pedis bilateral foot Discussed the etiology and treatment options for tinea pedis.  Discussed topical and oral treatment.  Recommended topical treatment with 2% ketoconazole cream.  This was sent to the patient's pharmacy.  Also discussed appropriate foot hygiene, use of antifungal spray such as Tinactin in shoes, as well as cleaning her foot surfaces such as showers and bathroom floors with bleach.  #Onychomycosis with pain  -Nails palliatively debrided as below. -Educated on self-care  Procedure: Nail Debridement Rationale: Pain Type of Debridement: manual, sharp debridement. Instrumentation: Nail nipper, rotary burr. Number of Nails: 10  Return in about 3 months (around 05/08/2023) for  [facility].         Corinna Gab, DPM [facility] / [facility]

## 2023-02-10 ENCOUNTER — Encounter: Payer: Medicaid Other | Attending: Cardiology

## 2023-02-10 DIAGNOSIS — I5042 Chronic combined systolic (congestive) and diastolic (congestive) heart failure: Secondary | ICD-10-CM | POA: Diagnosis not present

## 2023-02-10 DIAGNOSIS — Z95810 Presence of automatic (implantable) cardiac defibrillator: Secondary | ICD-10-CM | POA: Insufficient documentation

## 2023-02-12 ENCOUNTER — Ambulatory Visit (HOSPITAL_COMMUNITY)
Admission: RE | Admit: 2023-02-12 | Discharge: 2023-02-12 | Disposition: A | Discharge status: Home / Self Care | Payer: Medicaid Other | Source: Ambulatory Visit | Attending: Internal Medicine | Admitting: Internal Medicine

## 2023-02-12 DIAGNOSIS — G8929 Other chronic pain: Secondary | ICD-10-CM | POA: Diagnosis present

## 2023-02-12 DIAGNOSIS — M545 Low back pain, unspecified: Secondary | ICD-10-CM | POA: Diagnosis not present

## 2023-02-12 DIAGNOSIS — M47814 Spondylosis without myelopathy or radiculopathy, thoracic region: Secondary | ICD-10-CM | POA: Diagnosis not present

## 2023-02-12 DIAGNOSIS — M48061 Spinal stenosis, lumbar region without neurogenic claudication: Secondary | ICD-10-CM | POA: Diagnosis not present

## 2023-02-12 DIAGNOSIS — M5134 Other intervertebral disc degeneration, thoracic region: Secondary | ICD-10-CM | POA: Diagnosis not present

## 2023-02-12 DIAGNOSIS — M5184 Other intervertebral disc disorders, thoracic region: Secondary | ICD-10-CM | POA: Diagnosis not present

## 2023-02-12 DIAGNOSIS — M5135 Other intervertebral disc degeneration, thoracolumbar region: Secondary | ICD-10-CM | POA: Diagnosis not present

## 2023-02-13 ENCOUNTER — Encounter: Payer: Self-pay | Admitting: Internal Medicine

## 2023-02-13 ENCOUNTER — Ambulatory Visit: Payer: Medicaid Other | Attending: Internal Medicine | Admitting: Internal Medicine

## 2023-02-13 DIAGNOSIS — J4489 Other specified chronic obstructive pulmonary disease: Secondary | ICD-10-CM

## 2023-02-13 DIAGNOSIS — E1169 Type 2 diabetes mellitus with other specified complication: Secondary | ICD-10-CM | POA: Diagnosis not present

## 2023-02-13 DIAGNOSIS — Z2821 Immunization not carried out because of patient refusal: Secondary | ICD-10-CM | POA: Diagnosis not present

## 2023-02-13 DIAGNOSIS — G8929 Other chronic pain: Secondary | ICD-10-CM

## 2023-02-13 DIAGNOSIS — F411 Generalized anxiety disorder: Secondary | ICD-10-CM | POA: Diagnosis not present

## 2023-02-13 DIAGNOSIS — Z7984 Long term (current) use of oral hypoglycemic drugs: Secondary | ICD-10-CM

## 2023-02-13 DIAGNOSIS — Z6841 Body Mass Index (BMI) 40.0 and over, adult: Secondary | ICD-10-CM | POA: Diagnosis not present

## 2023-02-13 DIAGNOSIS — G4733 Obstructive sleep apnea (adult) (pediatric): Secondary | ICD-10-CM

## 2023-02-13 DIAGNOSIS — E119 Type 2 diabetes mellitus without complications: Secondary | ICD-10-CM

## 2023-02-13 DIAGNOSIS — I5022 Chronic systolic (congestive) heart failure: Secondary | ICD-10-CM | POA: Diagnosis not present

## 2023-02-13 DIAGNOSIS — Z794 Long term (current) use of insulin: Secondary | ICD-10-CM | POA: Diagnosis not present

## 2023-02-13 DIAGNOSIS — M545 Low back pain, unspecified: Secondary | ICD-10-CM | POA: Diagnosis not present

## 2023-02-13 LAB — POCT GLYCOSYLATED HEMOGLOBIN (HGB A1C): HbA1c, POC (controlled diabetic range): 9.1 % — AB (ref 0.0–7.0)

## 2023-02-13 LAB — GLUCOSE, POCT (MANUAL RESULT ENTRY): POC Glucose: 191 mg/dL — AB (ref 70–99)

## 2023-02-13 NOTE — Progress Notes (Signed)
EPIC Encounter for ICM Monitoring  Patient Name: [name] is a 57 y.o. male Date: 02/13/2023 Primary Care Physican: Marcine Matar, MD Primary Cardiologist: Crenshaw/[name] Electrophysiologist: Lalla Brothers 06/17/2022 Office Weight: 251 lbs 08/04/2022 Weight: 271 lbs 08/12/2022 Weight: 271 lbs 09/22/2022 Weight: 281 lbs 12/03/2022 Weight: 282 lbs 01/07/2023 Weight: 280 lbs 02/13/2023 Weight: 297 lbs                            Spoke with patient and heart failure questions reviewed.  Transmission results reviewed.  Pt reports shoes feel tight and he thinks he is holding fluid in stomach which is not correlating with Corvue.  He has HF clinic appt on 9/16 and will discuss  at that time.     Corvue thoracic impedance suggesting normal fluid levels within the last month.   Prescribed dosage:  Furosemide 80 mg take 1 tablet (80 mg total) as needed daily for fluid.   Pt reports on 02/13/23 he is taking 80 mg twice a day which is not the current script on file in EPIC. Spironolactone 25 mg take 1 tablet (0.5 mg total) by mouth once a day.    Labs: 10/28/2022 Creatinine 2.56, BUN 30, Potassium 3.7, Sodium 139, GFR 28 08/05/2022 Creatinine 2.14, BUN 22, Potassium 4.4, Sodium 141, GFR 35 06/16/2022 Creatinine 2.07, BUN 23, Potassium 4.1, Sodium 138, GFR 37  06/15/2022 Creatinine 2.33, BUN 25, Potassium 4.1, Sodium 137, GFR 32  06/14/2022 Creatinine 2.65, BUN 19, Potassium 4.4, Sodium 136, GFR 27  A complete set of results can be found in Results Review.   Recommendations:  No changes and encouraged to call if experiencing any fluid symptoms.   Follow-up plan: ICM clinic phone appointment on 03/16/2023.   91 day device clinic remote transmission 02/18/2023.     EP/Cardiology Office Visits:   02/25/2023 with Dr Lalla Brothers.  02/23/2023 with Dr Gasper Lloyd.   Copy of ICM check sent to Dr Lalla Brothers.    3 month ICM trend: 02/10/2023.    12-14 Month ICM trend:     Karie Soda, RN 02/13/2023 11:04  AM
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
## 2023-02-13 NOTE — Patient Instructions (Signed)
Increase Lantus insulin to 36 units daily.  After 2-3 days if morning blood sugars still greater than 150, increase to 38 units.  Please cut back on sugar drinks and snacks. Referral submitted to ENT for sleep apnea to be considered for Inspira device.  Given weigh gain, swelling in legs and increase shortness of breath, you should be seen in the ER for decompensation of congestive heart failure.

## 2023-02-14 ENCOUNTER — Encounter: Payer: Self-pay | Admitting: Internal Medicine

## 2023-02-14 LAB — BASIC METABOLIC PANEL
BUN/Creatinine Ratio: 10 (ref 9–20)
BUN: 26 mg/dL — ABNORMAL HIGH (ref 6–24)
CO2: 24 mmol/L (ref 20–29)
Calcium: 9.2 mg/dL (ref 8.7–10.2)
Chloride: 104 mmol/L (ref 96–106)
Creatinine, Ser: 2.49 mg/dL — ABNORMAL HIGH (ref 0.76–1.27)
Glucose: 137 mg/dL — ABNORMAL HIGH (ref 70–99)
Potassium: 4 mmol/L (ref 3.5–5.2)
Sodium: 142 mmol/L (ref 134–144)
eGFR: 29 mL/min/{1.73_m2} — ABNORMAL LOW (ref >59)

## 2023-02-14 LAB — LIPID PANEL
Chol/HDL Ratio: 3.8 ratio (ref 0.0–5.0)
Cholesterol, Total: 166 mg/dL (ref 100–199)
HDL: 44 mg/dL (ref >39)
LDL Chol Calc (NIH): 76 mg/dL (ref 0–99)
Triglycerides: 283 mg/dL — ABNORMAL HIGH (ref 0–149)
VLDL Cholesterol Cal: 46 mg/dL — ABNORMAL HIGH (ref 5–40)

## 2023-02-16 ENCOUNTER — Encounter (HOSPITAL_COMMUNITY): Payer: Self-pay

## 2023-02-17 MED ORDER — CLONAZEPAM 0.5 MG PO TABS: 0.5000 mg | ORAL_TABLET | Freq: Two times a day (BID) | ORAL | 0 refills | Status: DC | PRN

## 2023-02-18 ENCOUNTER — Ambulatory Visit (INDEPENDENT_AMBULATORY_CARE_PROVIDER_SITE_OTHER): Payer: Medicaid Other

## 2023-02-18 DIAGNOSIS — I428 Other cardiomyopathies: Secondary | ICD-10-CM | POA: Diagnosis not present

## 2023-02-18 LAB — CUP PACEART REMOTE DEVICE CHECK
Battery Remaining Longevity: 92 mo
Battery Remaining Percentage: 87 %
Battery Voltage: 3.02 V
Brady Statistic AP VP Percent: 1 %
Brady Statistic AP VS Percent: 49 %
Brady Statistic AS VP Percent: 1 %
Brady Statistic AS VS Percent: 50 %
Brady Statistic RA Percent Paced: 49 %
Brady Statistic RV Percent Paced: 1 %
Date Time Interrogation Session: 20240910020014
HighPow Impedance: 69 Ohm
Implantable Lead Connection Status: 753985
Implantable Lead Connection Status: 753985
Implantable Lead Implant Date: 20150717
Implantable Lead Implant Date: 20231207
Implantable Lead Location: 753859
Implantable Lead Location: 753860
Implantable Pulse Generator Implant Date: 20231207
Lead Channel Impedance Value: 340 Ohm
Lead Channel Impedance Value: 390 Ohm
Lead Channel Pacing Threshold Amplitude: 0.5 V
Lead Channel Pacing Threshold Amplitude: 0.75 V
Lead Channel Pacing Threshold Pulse Width: 0.5 ms
Lead Channel Pacing Threshold Pulse Width: 0.5 ms
Lead Channel Sensing Intrinsic Amplitude: 12 mV
Lead Channel Sensing Intrinsic Amplitude: 2.3 mV
Lead Channel Setting Pacing Amplitude: 1.5 V
Lead Channel Setting Pacing Amplitude: 2 V
Lead Channel Setting Pacing Pulse Width: 0.5 ms
Lead Channel Setting Sensing Sensitivity: 0.5 mV
Pulse Gen Serial Number: 810051574
Zone Setting Status: 755011

## 2023-02-19 ENCOUNTER — Telehealth: Payer: Self-pay | Admitting: Cardiology

## 2023-02-19 NOTE — Telephone Encounter (Signed)
Left voicemail to return call to office.

## 2023-02-19 NOTE — Telephone Encounter (Signed)
Pt c/o swelling/edema: STAT if pt has developed SOB within 24 hours  If swelling, where is the swelling located? Stomach, feet and hands  How much weight have you gained and in what time span? Believes he has gained 60 pounds since November 2023 ED visit. Has gained 6 pounds in the last week  Have you gained 2 pounds in a day or 5 pounds in a week? Yes 6 pounds in the last week  Do you have a log of your daily weights (if so, list)? no  Are you currently taking a fluid pill? Yes, takes two a day  Are you currently SOB? Yes, states that his breathing is labored  Have you traveled recently in a car or plane for an extended period of time? No  Patient states that his SOB has developed over the last 2 or so months. He did not sound SOB on the phone.

## 2023-02-20 ENCOUNTER — Encounter: Payer: Self-pay | Admitting: Internal Medicine

## 2023-02-20 NOTE — Telephone Encounter (Signed)
Patient states edema for last couple of months. Notes in bilateral feet, states "not bad".  He then states it just happened in the last few weeks. Increased SOB since swelling.  States also been diagnosed with COPD and asthma. Does not weigh daily because"just so annoyed with it", 9/6 Wt 306 at OV  7/5   286 6/21 287 5/21  283 1/30  262  Lasix 80 mg Daily/PRN.  Patient taking 80mg  Twice a day.  Spironolactone 25 mg 1/2 tab daily. Taking as directed.  Appt for next week

## 2023-02-23 ENCOUNTER — Encounter (HOSPITAL_COMMUNITY): Payer: Self-pay | Admitting: Cardiology

## 2023-02-23 ENCOUNTER — Ambulatory Visit (HOSPITAL_COMMUNITY)
Admission: RE | Admit: 2023-02-23 | Discharge: 2023-02-23 | Disposition: A | Discharge status: Home / Self Care | Payer: Medicaid Other | Source: Ambulatory Visit | Attending: Cardiology | Admitting: Cardiology

## 2023-02-23 VITALS — BP 120/70 | HR 81 | Wt 306.6 lb

## 2023-02-23 DIAGNOSIS — Z794 Long term (current) use of insulin: Secondary | ICD-10-CM | POA: Diagnosis not present

## 2023-02-23 DIAGNOSIS — E119 Type 2 diabetes mellitus without complications: Secondary | ICD-10-CM | POA: Insufficient documentation

## 2023-02-23 DIAGNOSIS — R635 Abnormal weight gain: Secondary | ICD-10-CM | POA: Diagnosis not present

## 2023-02-23 DIAGNOSIS — R0602 Shortness of breath: Secondary | ICD-10-CM | POA: Diagnosis not present

## 2023-02-23 DIAGNOSIS — E8779 Other fluid overload: Secondary | ICD-10-CM | POA: Insufficient documentation

## 2023-02-23 DIAGNOSIS — F410 Panic disorder [episodic paroxysmal anxiety] without agoraphobia: Secondary | ICD-10-CM | POA: Insufficient documentation

## 2023-02-23 DIAGNOSIS — I428 Other cardiomyopathies: Secondary | ICD-10-CM

## 2023-02-23 DIAGNOSIS — L03317 Cellulitis of buttock: Secondary | ICD-10-CM | POA: Insufficient documentation

## 2023-02-23 DIAGNOSIS — I5042 Chronic combined systolic (congestive) and diastolic (congestive) heart failure: Secondary | ICD-10-CM | POA: Diagnosis not present

## 2023-02-23 DIAGNOSIS — N1832 Chronic kidney disease, stage 3b: Secondary | ICD-10-CM | POA: Insufficient documentation

## 2023-02-23 DIAGNOSIS — I130 Hypertensive heart and chronic kidney disease with heart failure and stage 1 through stage 4 chronic kidney disease, or unspecified chronic kidney disease: Secondary | ICD-10-CM | POA: Diagnosis not present

## 2023-02-23 DIAGNOSIS — I5022 Chronic systolic (congestive) heart failure: Secondary | ICD-10-CM | POA: Diagnosis present

## 2023-02-23 DIAGNOSIS — J449 Chronic obstructive pulmonary disease, unspecified: Secondary | ICD-10-CM | POA: Diagnosis not present

## 2023-02-23 LAB — BASIC METABOLIC PANEL
Anion gap: 11 (ref 5–15)
BUN: 18 mg/dL (ref 6–20)
CO2: 24 mmol/L (ref 22–32)
Calcium: 8.9 mg/dL (ref 8.9–10.3)
Chloride: 105 mmol/L (ref 98–111)
Creatinine, Ser: 2.28 mg/dL — ABNORMAL HIGH (ref 0.61–1.24)
GFR, Estimated: 32 mL/min — ABNORMAL LOW (ref >60)
Glucose, Bld: 113 mg/dL — ABNORMAL HIGH (ref 70–99)
Potassium: 3.6 mmol/L (ref 3.5–5.1)
Sodium: 140 mmol/L (ref 135–145)

## 2023-02-23 LAB — BRAIN NATRIURETIC PEPTIDE: B Natriuretic Peptide: 22 pg/mL (ref 0.0–100.0)

## 2023-02-23 MED ORDER — TORSEMIDE 20 MG PO TABS: 80.0000 mg | ORAL_TABLET | Freq: Every day | ORAL | 3 refills | Status: DC

## 2023-02-23 NOTE — Patient Instructions (Addendum)
Medication Changes:  STOP: LASIX (FUROSEMIDE)   START: TORSEMIDE 80MG  ONCE DAILY   Lab Work:  Labs done today, your results will be available in MyChart, we will contact you for abnormal readings.  Follow-Up in: 1 WEEK AS SCHEDULED WITH APP   At the [facility], you and your health needs are our priority. We have a designated team specialized in the treatment of Heart Failure. This Care Team includes your primary Heart Failure Specialized Cardiologist (physician), Advanced Practice Providers (APPs- Physician Assistants and Nurse Practitioners), and Pharmacist who all work together to provide you with the care you need, when you need it.   You may see any of the following providers on your designated Care Team at your next follow up:  Dr. Arvilla Meres Dr. Marca Ancona Dr. Marcos Eke, NP Robbie Lis, [place] [facility] [place], [place] Brynda Peon, NP Karle Plumber, PharmD   Please be sure to bring in all your medications bottles to every appointment.   Need to Contact us:  If you have any questions or concerns before your next appointment please send [place] a message through [place] or call our office at [phone].    TO LEAVE A MESSAGE FOR THE NURSE SELECT OPTION 2, PLEASE LEAVE A MESSAGE INCLUDING: YOUR NAME DATE OF BIRTH CALL BACK NUMBER REASON FOR CALL**this is important as we prioritize the call backs  YOU WILL RECEIVE A CALL BACK THE SAME DAY AS LONG AS YOU CALL BEFORE 4:00 PM

## 2023-02-23 NOTE — Progress Notes (Signed)
ReDS Vest / Clip - 02/23/23 1500       ReDS Vest / Clip   Station Marker D    Ruler Value 37    ReDS Value Range Moderate volume overload    ReDS Actual Value 40

## 2023-02-23 NOTE — Progress Notes (Addendum)
ADVANCED HEART FAILURE CLINIC NOTE  Referring Physician: Marcine Matar, MD  Primary Care: [name] Matar, MD Primary Cardiologist: Dr. Jens [name] EP: Dr. Lalla [name]  HPI: [name] is a 57 y.o. male with HFrEF (dx 2015) w/ LHC demonstrating normal coronary anatomy s/p primary prevention ICD, CKDIIIB & T2DM. He has been followed outpatient by Dr. Jens [name]. In 2017 he had a CPX with w/ moderate functional impairment, peak VO2 of 15, VE/VCO2 of 35. Outpatient he has been on a stable regimen of Entresto/coreg/farxiga. From a functional standpoint, [name] reports running 2-3 miles daily until he was diagnosed with 'long covid' 7 months ago. Since that time he has seen a sharp decline in exercise capacity. In November 2023, he was admitted for VT believed to be secondary to significant electrolyte derrangements from and underlying right gluteal abscess now s/p IV antibiotics. Throughout his admission he had recurrent device shocks for VT. He underwent RHC/LHC that demonstrated normal coronary anatomy with RHC showing normal cardiac output and low filling pressures. He subsequently underwent placement of right atrial lead with improvement in ectopy after atrial pacing. TTE during admission with LVEF of 30-35%.   Interval history:  He reports feeling very volume overloaded today with 10lb weight gain. He has 2+ edema; reports becoming very short of breath with minimal activity due to his underlying COPD. Unfortunately, he is also not taking his inhalers currently due to side effects associated with them (uncomfortable sensation in his chest).   Activity level/exercise tolerance: NYHA III Orthopnea:  Sleeps on 3 Paroxysmal noctural dyspnea:  Yes Chest pain/pressure:  No Orthostatic lightheadedness:  No Palpitations:  No Lower extremity edema:  1-2+ Presyncope/syncope:  No Cough:  No  Past Medical History:  Diagnosis Date   AICD (automatic cardioverter/defibrillator) present     Chronic bronchitis (HCC)    "get it most q yr" (12/23/2013)   Chronic systolic (congestive) heart failure (HCC)    Fracture of left humerus    a. 07/2013.   GERD (gastroesophageal reflux disease)    not at present time   Heart murmur    "born w/it"    History of renal calculi    Hypertension    Kidney stones    NICM (nonischemic cardiomyopathy) (HCC)    a. 07/2013 Echo: EF 20-25%, diff HK, Gr2 DD, mild MR, mod dil LA/RA. 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     albuterol (VENTOLIN HFA) 108 (90 Base) MCG/ACT inhaler Inhale 2 puffs into the lungs every 6 (six) hours as needed for wheezing or shortness of breath. 18 g 2   amiodarone (PACERONE) 200 MG tablet Take 1 tablet (200 mg total) by mouth daily. 90 tablet 3   aspirin 81 MG tablet Take 81 mg by mouth daily.     atorvastatin (LIPITOR) 10 MG tablet Take 1 tablet (10 mg total) by mouth daily. 90 tablet 1   benzonatate (TESSALON) 100 MG capsule Take 1 capsule (100 mg total) by mouth 2 (two) times daily as needed for cough. 20 capsule 0  Blood Glucose Monitoring Suppl (ACCU-CHEK GUIDE) w/Device KIT UAD 1 kit 0   Blood Glucose Monitoring Suppl DEVI 1 each by Does not apply route in the morning, at noon, and at bedtime. May substitute to any manufacturer covered by patient's insurance. 1 each 0   cetirizine (EQ ALLERGY RELIEF, CETIRIZINE,) 10 MG tablet Take 1 tablet (10 mg total) by mouth daily. 90 tablet 1   cholecalciferol (VITAMIN D3) 25 MCG (1000 UNIT) tablet Take 1,000 Units by mouth daily.     clobetasol cream (TEMOVATE)  0.05 % Apply 1 Application topically as needed.     clonazePAM (KLONOPIN) 0.5 MG tablet Take 1 tablet (0.5 mg total) by mouth 2 (two) times daily as needed for anxiety. 60 tablet 0   Continuous Blood Gluc Sensor (FREESTYLE LIBRE 2 SENSOR) MISC Use to check blood sugar TID. Change sensor once every 14 days. E11.65 2 each 11   dapagliflozin propanediol (FARXIGA) 10 MG TABS tablet Take 1 tablet (10 mg total) by mouth daily before breakfast. 90 tablet 3   EPINEPHrine 0.3 mg/0.3 mL IJ SOAJ injection Inject 0.3 mg into the muscle as needed. 1 each 1   famotidine (PEPCID) 20 MG tablet TAKE 1 TABLET BY MOUTH 1 TO 2 TIMES DAILY AS NEEDED 180 tablet 0   fluticasone (FLONASE) 50 MCG/ACT nasal spray Use 1-2 sprays in each nostril once a day as needed for nasal congestion. 16 g 5   fluticasone (FLOVENT HFA) 44 MCG/ACT inhaler Inhale 2 puffs into the lungs in the morning and at bedtime. 1 each 12   furosemide (LASIX) 80 MG tablet Take 1 tablet (80 mg total) by mouth daily as needed for edema or fluid. 180 tablet 1   gabapentin (NEURONTIN) 300 MG capsule Take 1 capsule by mouth at bedtime 90 capsule 1   glucose blood (ACCU-CHEK GUIDE) test strip Use as instructed 100 each 12   glucose blood (FREESTYLE LITE) test strip Use as instructed.  Please make sure this is compatible with [facility] 2 reader. 100 each 12   insulin aspart (NOVOLOG FLEXPEN) 100 UNIT/ML FlexPen Inject 14-18 units 2-3 a day before meals 30 mL 3   insulin glargine (LANTUS SOLOSTAR) 100 UNIT/ML Solostar Pen Inject 36 Units into the skin daily. 30 mL 3   Insulin Pen Needle (PEN NEEDLES) 31G X 8 MM MISC UAD 100 each 6   ipratropium-albuterol (DUONEB) 0.5-2.5 (3) MG/3ML SOLN Take 3 mLs by nebulization every 6 (six) hours as needed. 360 mL 2   ketoconazole (NIZORAL) 2 % cream Apply 1 Application topically daily. 30 g 0   Lancet Devices (ACCU-CHEK SOFTCLIX) lancets 1 each by Other route 3 (three) times daily. 1 each 0   methocarbamol (ROBAXIN) 500 MG  tablet TAKE 1 TABLET BY MOUTH ONCE DAILY AS NEEDED FOR MUSCLE SPASM 30 tablet 2   metoprolol succinate (TOPROL-XL) 50 MG 24 hr tablet Take 1 tablet (50 mg total) by mouth daily. 90 tablet 3   predniSONE (DELTASONE) 10 MG tablet 4 tabs for 2 days, then 3 tabs for 2 days, 2 tabs for 2 days, then 1 tab for 2 days, then stop 20 tablet 0   sacubitril-valsartan (ENTRESTO) 24-26 MG Take 1 tablet by mouth 2 (two) times daily. 60 tablet 5   sertraline (ZOLOFT) 25 MG tablet Take 1 tablet (25 mg total) by mouth daily. 90 tablet 0   sildenafil (VIAGRA) 100 MG tablet Take 1-2 tabs PO 1/2-1 hr prior to intercourse. 20 tablet 4   sodium chloride (  OCEAN) 0.65 % SOLN nasal spray Place 1 spray into both nostrils as needed for congestion. 104 mL 0   spironolactone (ALDACTONE) 25 MG tablet Take 0.5 tablets (12.5 mg total) by mouth daily. 30 tablet 5   tamsulosin (FLOMAX) 0.4 MG CAPS capsule Take 2 capsules (0.8 mg total) by mouth daily. 180 capsule 1   triamcinolone cream (KENALOG) 0.1 % Apply sparingly to red itchy areas twice daily as needed. Avoid face, neck, armpits or groin area. Do not use more than 3 weeks in a row. 45 g 5   No current facility-administered medications for this visit.    Allergies  Allergen Reactions   Bee Venom Anaphylaxis   Trulicity [Dulaglutide] Diarrhea    Extra dose of Trulicity caused cardiac arrest. Patient experience onset of diarrhea with using this medication - cleared after med stopped.      Social History   Socioeconomic History   Marital status: Single    Spouse name: Not on file   Number of children: Not on file   Years of education: Not on file   Highest education level: Not on file  Occupational History   Occupation: Disabled  Tobacco Use   Smoking status: Former    Current packs/day: 0.00    Average packs/day: 1 pack/day for 28.0 years (28.0 ttl pk-yrs)    Types: Cigarettes    Start date: 06/03/1986    Quit date: 06/03/2014    Years since quitting: 8.7    Smokeless tobacco: Never  Vaping Use   Vaping status: Never Used  Substance and Sexual Activity   Alcohol use: Yes   Drug use: Yes    Types: Marijuana    Comment: occasionally   Sexual activity: Yes  Other Topics Concern   Not on file  Social History Narrative   Pt lives with his brother    Social Determinants of Health   Financial Resource Strain: High Risk (05/08/2022)   Overall Financial Resource Strain (CARDIA)    Difficulty of Paying Living Expenses: Very hard  Food Insecurity: No Food Insecurity (05/14/2022)   Hunger Vital Sign    Worried About Running Out of Food in the Last Year: Never true    Ran Out of Food in the Last Year: Never true  Transportation Needs: No Transportation Needs (05/06/2022)   PRAPARE - Administrator, Civil Service (Medical): No    Lack of Transportation (Non-Medical): No  Physical Activity: Not on file  Stress: Not on file  Social Connections: Moderately Integrated (03/12/2021)   Social Connection and Isolation Panel [NHANES]    Frequency of Communication with Friends and Family: More than three times a week    Frequency of Social Gatherings with Friends and Family: More than three times a week    Attends Religious Services: More than 4 times per year    Active Member of [employer] or Organizations: Yes    Attends Banker Meetings: Never    Marital Status: Never married  Intimate Partner Violence: Not At Risk (05/06/2022)   Humiliation, Afraid, Rape, and Kick questionnaire    Fear of Current or Ex-Partner: No    Emotionally Abused: No    Physically Abused: No    Sexually Abused: No      Family History  Problem Relation Age of Onset   Diabetes Father    Arthritis Father    Hypertension Father    Diabetes Mother    Arthritis Mother    Hypertension Mother  Hypertension Brother     PHYSICAL EXAM: Vitals:   02/23/23 1529  BP: 120/70  Pulse: 81  SpO2: 95%   GENERAL: Well nourished, well developed, and in no  apparent distress at rest.  HEENT: Negative for arcus senilis or xanthelasma. There is no scleral icterus.  The mucous membranes are pink and moist.   NECK: Supple, No masses. Normal carotid upstrokes without bruits. No masses or thyromegaly.    CHEST: There are no chest wall deformities. There is no chest wall tenderness. Respirations are unlabored.  Lungs- CTA B/L CARDIAC:  unable to visualize due to body habitus         Normal rate with regular rhythm. No murmurs, rubs or gallops.  Pulses are 2+ and symmetrical in upper and lower extremities. 1-2+ pitting edema.  ABDOMEN: Soft, non-tender, non-distended. There are no masses or hepatomegaly. There are normal bowel sounds.  EXTREMITIES: Warm and well perfused with no cyanosis, clubbing.  LYMPHATIC: No axillary or supraclavicular lymphadenopathy.  NEUROLOGIC: Patient is oriented x3 with no focal or lateralizing neurologic deficits.  PSYCH: Patients affect is appropriate, there is no evidence of anxiety or depression.  SKIN: Warm and dry; no lesions or wounds.     DATA REVIEW  ECG:NSR  ECHO: 08/22/2022: LVEF 35%, normal RV function 05/06/2022: LVEF 30-35%, normal RV function.  02/16/19: LVEF 45-50%, normal RV function.   CATH: 05/12/22: HEMODYNAMICS: RA:                  1 mmHg (mean) RV:                  22/1-3 mmHg PA:                  23/6 mmHg (12 mean) PCWP:            4 mmHg (mean)                                      Estimated Fick CO/CI   7 L/min, 2.9 L/min/m2                                                 TPG                 8  mmHg                                              PVR                 ~1 Wood Units  PAPi                >5       IMPRESSION: Low pre and post capillary filling pressures.  Normal cardiac output/cardiac index.  Normal PVR & PA mean No obstructive CAD, left dominant w/ large Lcx supplying multiple large marginals.    ASSESSMENT & PLAN:  Heart failure with reduced EF, Stage C, NYHA  IIB-III Etiology of ZO:[id number] as demonstrated by coronary angiography; no family history. If he continues to have recurrent VT will consider evaluation for sarcoidosis. Device interrogation w/o arrhythmias NYHA class / AHA  Stage: III; REDS 40% today.  Volume status & Diuretics:  currently taking lasix 80mg  daily; torsemide 80mg  daily for the next 4-5 days. Repeat labs today.  Vasodilators:Entresto 24/26mg  BID Beta-Blocker: Continue Toprol to 50mg  MRA: Continue spironolactone 12.5 mg daily.  No other medication changes. Cardiometabolic:continue farxiga to 10mg  daily Devices therapies & Valvulopathies:dcICD Advanced therapies: CPX in 2017 peak VO2 of 15 AND ve/vco2 of 35 but with submaximal effort; consistent with moderate HF limitations. Advanced therapies at this time limited by CKD and social barriers.   2. VT -No further episodes on device interrogation.  3. T2DM - Farxiga, insulin  - Followed by his PCP  4. CKD3B - Continue farxiga and Entresto - repeat labs pending  5. Panic attacks -Will follow-up with psychiatry.  6. Gluteal cellulitis  -Following with general surgery  7.  Shortness of breath - Much of his dyspnea is secondary to underlying pulmonary disease; he is volume overloaded today though. REDs increased to 40%. Will start torsemide 80mg  daily although I feel that he will likely want to be admitted to [facility] for inpatient diuresis.   Serria Sloma Advanced Heart Failure Mechanical Circulatory Support

## 2023-02-24 ENCOUNTER — Encounter: Payer: Self-pay | Admitting: Internal Medicine

## 2023-02-25 ENCOUNTER — Encounter (HOSPITAL_COMMUNITY): Payer: Self-pay

## 2023-02-25 ENCOUNTER — Other Ambulatory Visit: Payer: Self-pay | Admitting: Internal Medicine

## 2023-02-25 ENCOUNTER — Encounter: Payer: Medicaid Other | Admitting: Cardiology

## 2023-02-25 DIAGNOSIS — E279 Disorder of adrenal gland, unspecified: Secondary | ICD-10-CM | POA: Diagnosis not present

## 2023-02-25 DIAGNOSIS — N400 Enlarged prostate without lower urinary tract symptoms: Secondary | ICD-10-CM | POA: Diagnosis not present

## 2023-02-25 DIAGNOSIS — D649 Anemia, unspecified: Secondary | ICD-10-CM | POA: Diagnosis not present

## 2023-02-25 DIAGNOSIS — N184 Chronic kidney disease, stage 4 (severe): Secondary | ICD-10-CM | POA: Diagnosis not present

## 2023-02-25 DIAGNOSIS — G8929 Other chronic pain: Secondary | ICD-10-CM

## 2023-02-25 DIAGNOSIS — E876 Hypokalemia: Secondary | ICD-10-CM | POA: Diagnosis not present

## 2023-02-25 DIAGNOSIS — N2581 Secondary hyperparathyroidism of renal origin: Secondary | ICD-10-CM | POA: Diagnosis not present

## 2023-02-25 DIAGNOSIS — E1122 Type 2 diabetes mellitus with diabetic chronic kidney disease: Secondary | ICD-10-CM | POA: Diagnosis not present

## 2023-02-25 DIAGNOSIS — I428 Other cardiomyopathies: Secondary | ICD-10-CM | POA: Diagnosis not present

## 2023-02-26 NOTE — Telephone Encounter (Signed)
Would you like to direct admit on 9/20 or have pt return for follow up 9/25 to assess and admit if needed?

## 2023-02-27 ENCOUNTER — Emergency Department (HOSPITAL_COMMUNITY): Payer: Medicaid Other

## 2023-02-27 ENCOUNTER — Encounter (HOSPITAL_COMMUNITY): Payer: Self-pay | Admitting: *Deleted

## 2023-02-27 ENCOUNTER — Inpatient Hospital Stay (HOSPITAL_COMMUNITY)
Admission: EM | Admit: 2023-02-27 | Discharge: 2023-03-03 | DRG: 291 | Disposition: A | Discharge status: Home / Self Care | Payer: Medicaid Other | Attending: Internal Medicine | Admitting: Internal Medicine

## 2023-02-27 ENCOUNTER — Other Ambulatory Visit: Payer: Self-pay

## 2023-02-27 DIAGNOSIS — Z95810 Presence of automatic (implantable) cardiac defibrillator: Secondary | ICD-10-CM

## 2023-02-27 DIAGNOSIS — I110 Hypertensive heart disease with heart failure: Secondary | ICD-10-CM | POA: Diagnosis not present

## 2023-02-27 DIAGNOSIS — Z8249 Family history of ischemic heart disease and other diseases of the circulatory system: Secondary | ICD-10-CM

## 2023-02-27 DIAGNOSIS — I472 Ventricular tachycardia, unspecified: Secondary | ICD-10-CM | POA: Diagnosis present

## 2023-02-27 DIAGNOSIS — N1832 Chronic kidney disease, stage 3b: Secondary | ICD-10-CM | POA: Diagnosis present

## 2023-02-27 DIAGNOSIS — E785 Hyperlipidemia, unspecified: Secondary | ICD-10-CM | POA: Diagnosis present

## 2023-02-27 DIAGNOSIS — E1165 Type 2 diabetes mellitus with hyperglycemia: Secondary | ICD-10-CM | POA: Diagnosis present

## 2023-02-27 DIAGNOSIS — Z888 Allergy status to other drugs, medicaments and biological substances status: Secondary | ICD-10-CM

## 2023-02-27 DIAGNOSIS — Z6841 Body Mass Index (BMI) 40.0 and over, adult: Secondary | ICD-10-CM

## 2023-02-27 DIAGNOSIS — J4489 Other specified chronic obstructive pulmonary disease: Secondary | ICD-10-CM | POA: Diagnosis present

## 2023-02-27 DIAGNOSIS — I5022 Chronic systolic (congestive) heart failure: Secondary | ICD-10-CM | POA: Diagnosis present

## 2023-02-27 DIAGNOSIS — I509 Heart failure, unspecified: Secondary | ICD-10-CM | POA: Diagnosis not present

## 2023-02-27 DIAGNOSIS — E1169 Type 2 diabetes mellitus with other specified complication: Secondary | ICD-10-CM | POA: Diagnosis present

## 2023-02-27 DIAGNOSIS — Z833 Family history of diabetes mellitus: Secondary | ICD-10-CM

## 2023-02-27 DIAGNOSIS — Z79899 Other long term (current) drug therapy: Secondary | ICD-10-CM

## 2023-02-27 DIAGNOSIS — I5043 Acute on chronic combined systolic (congestive) and diastolic (congestive) heart failure: Secondary | ICD-10-CM | POA: Diagnosis present

## 2023-02-27 DIAGNOSIS — J9811 Atelectasis: Secondary | ICD-10-CM | POA: Diagnosis not present

## 2023-02-27 DIAGNOSIS — K449 Diaphragmatic hernia without obstruction or gangrene: Secondary | ICD-10-CM | POA: Diagnosis not present

## 2023-02-27 DIAGNOSIS — E876 Hypokalemia: Secondary | ICD-10-CM | POA: Diagnosis present

## 2023-02-27 DIAGNOSIS — R918 Other nonspecific abnormal finding of lung field: Secondary | ICD-10-CM | POA: Diagnosis not present

## 2023-02-27 DIAGNOSIS — Z5986 Financial insecurity: Secondary | ICD-10-CM

## 2023-02-27 DIAGNOSIS — I130 Hypertensive heart and chronic kidney disease with heart failure and stage 1 through stage 4 chronic kidney disease, or unspecified chronic kidney disease: Principal | ICD-10-CM | POA: Diagnosis present

## 2023-02-27 DIAGNOSIS — Z794 Long term (current) use of insulin: Secondary | ICD-10-CM

## 2023-02-27 DIAGNOSIS — R0602 Shortness of breath: Secondary | ICD-10-CM | POA: Diagnosis not present

## 2023-02-27 DIAGNOSIS — I428 Other cardiomyopathies: Secondary | ICD-10-CM | POA: Diagnosis present

## 2023-02-27 DIAGNOSIS — E1159 Type 2 diabetes mellitus with other circulatory complications: Secondary | ICD-10-CM | POA: Diagnosis not present

## 2023-02-27 DIAGNOSIS — Z7982 Long term (current) use of aspirin: Secondary | ICD-10-CM

## 2023-02-27 DIAGNOSIS — Z87891 Personal history of nicotine dependence: Secondary | ICD-10-CM

## 2023-02-27 DIAGNOSIS — E66813 Obesity, class 3: Secondary | ICD-10-CM

## 2023-02-27 DIAGNOSIS — F419 Anxiety disorder, unspecified: Secondary | ICD-10-CM | POA: Diagnosis present

## 2023-02-27 DIAGNOSIS — Z9049 Acquired absence of other specified parts of digestive tract: Secondary | ICD-10-CM

## 2023-02-27 DIAGNOSIS — N179 Acute kidney failure, unspecified: Secondary | ICD-10-CM | POA: Diagnosis present

## 2023-02-27 DIAGNOSIS — Z8679 Personal history of other diseases of the circulatory system: Secondary | ICD-10-CM

## 2023-02-27 DIAGNOSIS — G8929 Other chronic pain: Secondary | ICD-10-CM | POA: Diagnosis present

## 2023-02-27 DIAGNOSIS — Z7951 Long term (current) use of inhaled steroids: Secondary | ICD-10-CM

## 2023-02-27 DIAGNOSIS — Z91030 Bee allergy status: Secondary | ICD-10-CM

## 2023-02-27 DIAGNOSIS — R0600 Dyspnea, unspecified: Secondary | ICD-10-CM | POA: Diagnosis not present

## 2023-02-27 DIAGNOSIS — I5023 Acute on chronic systolic (congestive) heart failure: Secondary | ICD-10-CM | POA: Diagnosis not present

## 2023-02-27 DIAGNOSIS — E1122 Type 2 diabetes mellitus with diabetic chronic kidney disease: Secondary | ICD-10-CM | POA: Diagnosis present

## 2023-02-27 DIAGNOSIS — Z91148 Patient's other noncompliance with medication regimen for other reason: Secondary | ICD-10-CM

## 2023-02-27 LAB — BASIC METABOLIC PANEL
Anion gap: 16 — ABNORMAL HIGH (ref 5–15)
BUN: 25 mg/dL — ABNORMAL HIGH (ref 6–20)
CO2: 22 mmol/L (ref 22–32)
Calcium: 9 mg/dL (ref 8.9–10.3)
Chloride: 99 mmol/L (ref 98–111)
Creatinine, Ser: 2.55 mg/dL — ABNORMAL HIGH (ref 0.61–1.24)
GFR, Estimated: 28 mL/min — ABNORMAL LOW (ref >60)
Glucose, Bld: 281 mg/dL — ABNORMAL HIGH (ref 70–99)
Potassium: 3.4 mmol/L — ABNORMAL LOW (ref 3.5–5.1)
Sodium: 137 mmol/L (ref 135–145)

## 2023-02-27 LAB — CBC
HCT: 42 % (ref 39.0–52.0)
Hemoglobin: 13.3 g/dL (ref 13.0–17.0)
MCH: 30.6 pg (ref 26.0–34.0)
MCHC: 31.7 g/dL (ref 30.0–36.0)
MCV: 96.8 fL (ref 80.0–100.0)
Platelets: 281 10*3/uL (ref 150–400)
RBC: 4.34 MIL/uL (ref 4.22–5.81)
RDW: 14.6 % (ref 11.5–15.5)
WBC: 5.7 10*3/uL (ref 4.0–10.5)
nRBC: 0 % (ref 0.0–0.2)

## 2023-02-27 LAB — BRAIN NATRIURETIC PEPTIDE: B Natriuretic Peptide: 24.6 pg/mL (ref 0.0–100.0)

## 2023-02-27 LAB — TROPONIN I (HIGH SENSITIVITY)
Troponin I (High Sensitivity): 9 ng/L (ref <18)
Troponin I (High Sensitivity): 11 ng/L (ref <18)

## 2023-02-27 MED ORDER — HEPARIN SODIUM (PORCINE) 5000 UNIT/ML IJ SOLN
5000.0000 [IU] | Freq: Three times a day (TID) | INTRAMUSCULAR | Status: DC
  Administered 2023-02-28 – 2023-03-03 (×9): 5000 [IU] via SUBCUTANEOUS
  Filled 2023-02-27 (×9): qty 1

## 2023-02-27 MED ORDER — GABAPENTIN 300 MG PO CAPS
300.0000 mg | ORAL_CAPSULE | Freq: Every day | ORAL | Status: DC
  Administered 2023-02-28 – 2023-03-02 (×4): 300 mg via ORAL
  Filled 2023-02-27 (×4): qty 1

## 2023-02-27 MED ORDER — ATORVASTATIN CALCIUM 10 MG PO TABS
10.0000 mg | ORAL_TABLET | Freq: Every day | ORAL | Status: DC
  Administered 2023-02-28 – 2023-03-03 (×4): 10 mg via ORAL
  Filled 2023-02-27 (×4): qty 1

## 2023-02-27 MED ORDER — UMECLIDINIUM BROMIDE 62.5 MCG/ACT IN AEPB
1.0000 | INHALATION_SPRAY | Freq: Every day | RESPIRATORY_TRACT | Status: DC
  Administered 2023-03-01 – 2023-03-03 (×3): 1 via RESPIRATORY_TRACT
  Filled 2023-02-27: qty 7

## 2023-02-27 MED ORDER — CLONAZEPAM 0.5 MG PO TABS
0.5000 mg | ORAL_TABLET | Freq: Two times a day (BID) | ORAL | Status: DC | PRN
  Administered 2023-02-28 – 2023-03-03 (×6): 0.5 mg via ORAL
  Filled 2023-02-27 (×6): qty 1

## 2023-02-27 MED ORDER — METHOCARBAMOL 500 MG PO TABS: 500.0000 mg | ORAL_TABLET | Freq: Every day | ORAL | Status: DC | PRN

## 2023-02-27 MED ORDER — ONDANSETRON HCL 4 MG/2ML IJ SOLN: 4.0000 mg | Freq: Four times a day (QID) | INTRAMUSCULAR | Status: DC | PRN

## 2023-02-27 MED ORDER — FLUTICASONE-UMECLIDIN-VILANT 100-62.5-25 MCG/ACT IN AEPB: INHALATION_SPRAY | Freq: Every day | RESPIRATORY_TRACT | Status: DC

## 2023-02-27 MED ORDER — POTASSIUM CHLORIDE CRYS ER 20 MEQ PO TBCR
20.0000 meq | EXTENDED_RELEASE_TABLET | Freq: Two times a day (BID) | ORAL | Status: DC
  Administered 2023-02-28: 20 meq via ORAL
  Filled 2023-02-27 (×2): qty 1

## 2023-02-27 MED ORDER — SERTRALINE HCL 50 MG PO TABS
25.0000 mg | ORAL_TABLET | Freq: Every day | ORAL | Status: DC
  Administered 2023-02-28 – 2023-03-03 (×4): 25 mg via ORAL
  Filled 2023-02-27 (×4): qty 1

## 2023-02-27 MED ORDER — FLUTICASONE FUROATE-VILANTEROL 100-25 MCG/ACT IN AEPB
1.0000 | INHALATION_SPRAY | Freq: Every day | RESPIRATORY_TRACT | Status: DC
  Administered 2023-03-01 – 2023-03-03 (×3): 1 via RESPIRATORY_TRACT
  Filled 2023-02-27 (×3): qty 28

## 2023-02-27 MED ORDER — SACUBITRIL-VALSARTAN 24-26 MG PO TABS
1.0000 | ORAL_TABLET | Freq: Two times a day (BID) | ORAL | Status: DC
  Administered 2023-02-28 – 2023-03-03 (×8): 1 via ORAL
  Filled 2023-02-27 (×8): qty 1

## 2023-02-27 MED ORDER — ACETAMINOPHEN 650 MG RE SUPP: 650.0000 mg | Freq: Four times a day (QID) | RECTAL | Status: DC | PRN

## 2023-02-27 MED ORDER — INSULIN ASPART 100 UNIT/ML IJ SOLN
0.0000 [IU] | Freq: Three times a day (TID) | INTRAMUSCULAR | Status: DC
  Administered 2023-02-28: 1 [IU] via SUBCUTANEOUS
  Administered 2023-02-28: 3 [IU] via SUBCUTANEOUS
  Administered 2023-02-28: 2 [IU] via SUBCUTANEOUS
  Administered 2023-03-01: 3 [IU] via SUBCUTANEOUS
  Administered 2023-03-01: 2 [IU] via SUBCUTANEOUS
  Administered 2023-03-01: 3 [IU] via SUBCUTANEOUS
  Administered 2023-03-02: 2 [IU] via SUBCUTANEOUS
  Administered 2023-03-02: 7 [IU] via SUBCUTANEOUS
  Administered 2023-03-02 – 2023-03-03 (×2): 2 [IU] via SUBCUTANEOUS

## 2023-02-27 MED ORDER — ACETAMINOPHEN 325 MG PO TABS: 650.0000 mg | ORAL_TABLET | Freq: Four times a day (QID) | ORAL | Status: DC | PRN

## 2023-02-27 MED ORDER — SPIRONOLACTONE 12.5 MG HALF TABLET
12.5000 mg | ORAL_TABLET | Freq: Every day | ORAL | Status: DC
  Administered 2023-02-28 – 2023-03-03 (×4): 12.5 mg via ORAL
  Filled 2023-02-27 (×4): qty 1

## 2023-02-27 MED ORDER — SODIUM CHLORIDE 0.9% FLUSH
3.0000 mL | Freq: Two times a day (BID) | INTRAVENOUS | Status: DC
  Administered 2023-02-28 – 2023-03-03 (×7): 3 mL via INTRAVENOUS

## 2023-02-27 MED ORDER — ONDANSETRON HCL 4 MG PO TABS: 4.0000 mg | ORAL_TABLET | Freq: Four times a day (QID) | ORAL | Status: DC | PRN

## 2023-02-27 MED ORDER — METOPROLOL SUCCINATE ER 50 MG PO TB24
50.0000 mg | ORAL_TABLET | Freq: Every day | ORAL | Status: DC
  Administered 2023-02-28 – 2023-03-03 (×4): 50 mg via ORAL
  Filled 2023-02-27 (×4): qty 1

## 2023-02-27 MED ORDER — SENNOSIDES-DOCUSATE SODIUM 8.6-50 MG PO TABS: 1.0000 | ORAL_TABLET | Freq: Every evening | ORAL | Status: DC | PRN

## 2023-02-27 MED ORDER — ALBUTEROL SULFATE (2.5 MG/3ML) 0.083% IN NEBU: 2.5000 mg | INHALATION_SOLUTION | Freq: Four times a day (QID) | RESPIRATORY_TRACT | Status: DC | PRN

## 2023-02-27 MED ORDER — FUROSEMIDE 10 MG/ML IJ SOLN
80.0000 mg | Freq: Two times a day (BID) | INTRAMUSCULAR | Status: DC
  Administered 2023-02-28: 80 mg via INTRAVENOUS
  Filled 2023-02-27: qty 8

## 2023-02-27 MED ORDER — INSULIN GLARGINE-YFGN 100 UNIT/ML ~~LOC~~ SOLN
20.0000 [IU] | Freq: Every day | SUBCUTANEOUS | Status: DC
  Administered 2023-02-28 – 2023-03-03 (×4): 20 [IU] via SUBCUTANEOUS
  Filled 2023-02-27 (×6): qty 0.2

## 2023-02-27 MED ORDER — AMIODARONE HCL 200 MG PO TABS
200.0000 mg | ORAL_TABLET | Freq: Every day | ORAL | Status: DC
  Administered 2023-02-28 – 2023-03-03 (×4): 200 mg via ORAL
  Filled 2023-02-27 (×4): qty 1

## 2023-02-27 MED ORDER — IPRATROPIUM-ALBUTEROL 0.5-2.5 (3) MG/3ML IN SOLN: 3.0000 mL | RESPIRATORY_TRACT | Status: DC | PRN

## 2023-02-27 MED ORDER — TAMSULOSIN HCL 0.4 MG PO CAPS
0.8000 mg | ORAL_CAPSULE | Freq: Every day | ORAL | Status: DC
  Administered 2023-02-28 – 2023-03-03 (×4): 0.8 mg via ORAL
  Filled 2023-02-27 (×4): qty 2

## 2023-02-27 MED ORDER — INSULIN ASPART 100 UNIT/ML IJ SOLN
0.0000 [IU] | Freq: Every day | INTRAMUSCULAR | Status: DC
  Administered 2023-02-28 – 2023-03-01 (×2): 2 [IU] via SUBCUTANEOUS

## 2023-02-27 NOTE — ED Notes (Signed)
Patient transported to CT 

## 2023-02-27 NOTE — H&P (Signed)
History and Physical    [name]:[id number] DOB: 10/24/1965 DOA: 02/27/2023  PCP: Marcine Matar, MD   Patient coming from: Home   Chief Complaint: SOB   HPI: [name] is a 57 y.o. male with medical history significant for BMI 40, insulin-dependent diabetes mellitus, hypertension, nonischemic cardiomyopathy with ICD, CKD 3B, and COPD who presents for evaluation of shortness of breath.  Patient reports insidiously worsening dyspnea over the course of weeks.  He has also feels that he has been retaining increasing amounts of fluid around his abdomen, and states that his lower legs have been swelling for the first time ever.  He denies any associated chest pain, has not been coughing much at all, and denies fevers.  He has not been using Trelegy or albuterol as he had chest discomfort previously when using them but is willing to try resuming his inhalers now.  He was seen in the advanced heart failure clinic on 02/23/2023 and was started on 80 mg torsemide daily.  He states that he has continued to worsen despite this and his cardiologist wanted him to be admitted for several days per his report.  ED Course: Upon arrival to the ED, patient is found to be afebrile and saturating well on room air with normal heart rate and stable blood pressure.  EKG demonstrates sinus rhythm with nonspecific IVCD.  There are no acute findings on chest CT.  Labs are most notable for glucose 281, creatinine 2.55, potassium 3.4, normal WBC, normal troponin x 2, and normal BNP.  Cardiology was consulted by the ED PA.  Review of Systems:  All other systems reviewed and apart from HPI, are negative.  Past Medical History:  Diagnosis Date   AICD (automatic cardioverter/defibrillator) present    Chronic bronchitis (HCC)    "get it most q yr" (12/23/2013)   Chronic systolic (congestive) heart failure (HCC)    Fracture of left humerus    a. 07/2013.   GERD (gastroesophageal reflux disease)    not  at present time   Heart murmur    "born w/it"    History of renal calculi    Hypertension    Kidney stones    NICM (nonischemic cardiomyopathy) (HCC)    a. 07/2013 Echo: EF 20-25%, diff HK, Gr2 DD, mild MR, mod dil LA/RA. 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    Shockable heart rhythm detected by automated external defibrillator    Syncope    Type II diabetes mellitus (HCC) 2006    Past Surgical History:  Procedure Laterality Date   CARDIAC CATHETERIZATION  08/10/13   CHOLECYSTECTOMY  ~ 2010   IMPLANTABLE CARDIOVERTER DEFIBRILLATOR IMPLANT  12/23/2013   STJ Fortify ICD implanted by Dr Johney Frame for cardiomyopathy and syncope   IMPLANTABLE CARDIOVERTER DEFIBRILLATOR IMPLANT N/A 12/23/2013   Procedure: IMPLANTABLE CARDIOVERTER DEFIBRILLATOR IMPLANT;  Surgeon: Gardiner Rhyme, MD;  Location: [facility] CATH LAB;  Service: Cardiovascular;  Laterality: N/A;   INGUINAL HERNIA REPAIR Left 03/01/2018   Procedure: OPEN REPAIR OF LEFT INGUINAL HERNIA WITH MESH;  Surgeon: Gaynelle Adu, MD;  Location: [facility] ORS;  Service: General;  Laterality: Left;   LEAD REVISION/REPAIR N/A 05/15/2022   Procedure: LEAD REVISION/REPAIR;  Surgeon: Lanier Prude, MD;  Location: [facility] INVASIVE CV LAB;  Service: Cardiovascular;  Laterality: N/A;   LEFT HEART CATHETERIZATION WITH CORONARY ANGIOGRAM N/A 08/10/2013   Procedure: LEFT HEART CATHETERIZATION WITH CORONARY  ANGIOGRAM;  Surgeon: Corky Crafts, MD;  Location: [facility] CATH LAB;  Service: Cardiovascular;  Laterality: N/A;   ORIF HUMERUS FRACTURE Left 08/12/2013   Procedure: OPEN REDUCTION INTERNAL FIXATION (ORIF) HUMERAL SHAFT FRACTURE;  Surgeon: Nadara Mustard, MD;  Location: [facility] OR;  Service: Orthopedics;  Laterality: Left;  Open Reduction Internal Fixation Left Humerus   RIGHT/LEFT HEART CATH AND CORONARY ANGIOGRAPHY N/A 05/12/2022   Procedure: RIGHT/LEFT HEART CATH AND CORONARY ANGIOGRAPHY;  Surgeon:  Dorthula Nettles, DO;  Location: [facility] INVASIVE CV LAB;  Service: Cardiovascular;  Laterality: N/A;   URETEROSCOPY     "laser for kidney stones"    Social History:   reports that he quit smoking about 8 years ago. His smoking use included cigarettes. He started smoking about 36 years ago. He has a 28 pack-year smoking history. He has never used smokeless tobacco. He reports current alcohol use. He reports current drug use. Drug: Marijuana.  Allergies  Allergen Reactions   Bee Venom Anaphylaxis   Trulicity [Dulaglutide] Diarrhea    Extra dose of Trulicity caused cardiac arrest. Patient experience onset of diarrhea with using this medication - cleared after med stopped.    Family History  Problem Relation Age of Onset   Diabetes Father    Arthritis Father    Hypertension Father    Diabetes Mother    Arthritis Mother    Hypertension Mother    Hypertension Brother      Prior to Admission medications   Medication Sig Start Date End Date Taking? Authorizing Provider  acetaminophen (TYLENOL) 500 MG tablet Take 1,000 mg by mouth 2 (two) times daily.   Yes [provider]  amiodarone (PACERONE) 200 MG tablet Take 1 tablet (200 mg total) by mouth daily. 08/15/22  Yes Sheilah Pigeon, PA-C  aspirin 81 MG tablet Take 81 mg by mouth daily.   Yes [provider]  atorvastatin (LIPITOR) 10 MG tablet Take 1 tablet (10 mg total) by mouth daily. 01/27/23  Yes Marcine Matar, MD  cetirizine (EQ ALLERGY RELIEF, CETIRIZINE,) 10 MG tablet Take 1 tablet (10 mg total) by mouth daily. 10/30/22  Yes Ambs, Norvel Richards, FNP  cholecalciferol (VITAMIN D3) 25 MCG (1000 UNIT) tablet Take 1,000 Units by mouth daily.   Yes [provider]  clonazePAM (KLONOPIN) 0.5 MG tablet Take 1 tablet (0.5 mg total) by mouth 2 (two) times daily as needed for anxiety. Patient taking differently: Take 0.5 mg by mouth 2 (two) times daily. 02/17/23 02/17/24 Yes Stasia Cavalier, MD  dapagliflozin propanediol  (FARXIGA) 10 MG TABS tablet Take 1 tablet (10 mg total) by mouth daily before breakfast. 05/26/22  Yes [name], Aditya, DO  EPINEPHrine 0.3 mg/0.3 mL IJ SOAJ injection Inject 0.3 mg into the muscle as needed. 01/30/22  Yes Alfonse Spruce, MD  famotidine (PEPCID) 20 MG tablet TAKE 1 TABLET BY MOUTH 1 TO 2 TIMES DAILY AS NEEDED 01/26/23  Yes Ambs, Norvel Richards, FNP  fluticasone (FLONASE) 50 MCG/ACT nasal spray Use 1-2 sprays in each nostril once a day as needed for nasal congestion. 01/30/22  Yes Alfonse Spruce, MD  gabapentin (NEURONTIN) 300 MG capsule Take 1 capsule by mouth at bedtime 10/15/22  Yes Marcine Matar, MD  insulin aspart (NOVOLOG) 100 UNIT/ML injection Inject 24 Units into the skin 3 (three) times daily with meals.   Yes [provider]  insulin glargine (LANTUS SOLOSTAR) 100 UNIT/ML Solostar Pen Inject 36 Units into the skin daily. 12/12/22  Yes Gherghe,  Silvestre Mesi, MD  ketoconazole (NIZORAL) 2 % cream Apply 1 Application topically daily. 02/05/23  Yes Standiford, Jenelle Mages, DPM  methocarbamol (ROBAXIN) 500 MG tablet Take 500 mg by mouth daily.   Yes [provider]  metoprolol succinate (TOPROL-XL) 50 MG 24 hr tablet Take 1 tablet (50 mg total) by mouth daily. 08/22/22  Yes [name], Aditya, DO  sacubitril-valsartan (ENTRESTO) 24-26 MG Take 1 tablet by mouth 2 (two) times daily. 01/07/23  Yes [name], Aditya, DO  sertraline (ZOLOFT) 25 MG tablet Take 1 tablet (25 mg total) by mouth daily. 01/13/23 01/13/24 Yes Stasia Cavalier, MD  spironolactone (ALDACTONE) 25 MG tablet Take 0.5 tablets (12.5 mg total) by mouth daily. 05/26/22  Yes [name], Aditya, DO  tamsulosin (FLOMAX) 0.4 MG CAPS capsule Take 2 capsules (0.8 mg total) by mouth daily. 01/27/23  Yes Marcine Matar, MD  torsemide (DEMADEX) 20 MG tablet Take 4 tablets (80 mg total) by mouth daily. 02/23/23  Yes [name], Aditya, DO  Accu-Chek Softclix Lancets lancets Use as instructed 02/01/22   Lonell Grandchild, MD  albuterol (VENTOLIN HFA) 108 (90 Base) MCG/ACT inhaler Inhale 2 puffs into the lungs every 6 (six) hours as needed for wheezing or shortness of breath. Patient not taking: Reported on 02/23/2023 10/24/22   Glenford Bayley, NP  Blood Glucose Monitoring Suppl (ACCU-CHEK GUIDE) w/Device KIT UAD 02/01/22   Lonell Grandchild, MD  Blood Glucose Monitoring Suppl DEVI 1 each by Does not apply route in the morning, at noon, and at bedtime. May substitute to any manufacturer covered by patient's insurance. 12/23/22   Carlus Pavlov, MD  clobetasol cream (TEMOVATE) 0.05 % Apply 1 Application topically as needed.    [provider]  Continuous Blood Gluc Sensor (FREESTYLE LIBRE 2 SENSOR) MISC Use to check blood sugar TID. Change sensor once every 14 days. E11.65 07/01/22   Marcine Matar, MD  fluticasone (FLOVENT HFA) 44 MCG/ACT inhaler Inhale 2 puffs into the lungs in the morning and at bedtime. Patient not taking: Reported on 02/23/2023 11/10/22   Glenford Bayley, NP  glucose blood (ACCU-CHEK GUIDE) test strip Use as instructed 02/01/22   Lonell Grandchild, MD  glucose blood (FREESTYLE LITE) test strip Use as instructed.  Please make sure this is compatible with [facility] 2 reader. 07/14/22   Marcine Matar, MD  Insulin Pen Needle (PEN NEEDLES) 31G X 8 MM MISC UAD 03/01/22   Marcine Matar, MD  ipratropium-albuterol (DUONEB) 0.5-2.5 (3) MG/3ML SOLN Take 3 mLs by nebulization every 6 (six) hours as needed. Patient not taking: Reported on 02/23/2023 03/21/22   Lupita Leash, MD  Lancet Devices [facility]) lancets 1 each by Other route 3 (three) times daily. 10/20/14   Dessa Phi, MD    Physical Exam: Vitals:   02/27/23 1819 02/27/23 1827 02/27/23 2015 02/27/23 2158  BP: 114/67  121/88 (!) 113/91  Pulse: 68  61 64  Resp: (!) 24   18  Temp: 97.6 F (36.4 C)   97.8 F (36.6 C)  TempSrc:    Oral  SpO2: 95%  97% 97%  Weight:  (!) 138.8 kg    Height:  6'  1" (1.854 m)      Constitutional: NAD, no pallor or diaphoresis   Eyes: PERTLA, lids and conjunctivae normal ENMT: Mucous membranes are moist. Posterior pharynx clear of any exudate or lesions.   Neck: supple, no masses  Respiratory: occasional wheeze, no crackles. No accessory muscle use.  Cardiovascular:  S1 & S2 heard, regular rate and rhythm. Mild LE edema b/l.  Abdomen: No distension, no tenderness, soft. Bowel sounds active.  Musculoskeletal: no clubbing / cyanosis. No joint deformity upper and lower extremities.   Skin: no significant rashes, lesions, ulcers. Warm, dry, well-perfused. Neurologic: CN 2-12 grossly intact. Moving all extremities. Alert and oriented.  Psychiatric: Calm. Cooperative.    Labs and Imaging on Admission: I have personally reviewed following labs and imaging studies  CBC: Recent Labs  Lab 02/27/23 1842  WBC 5.7  HGB 13.3  HCT 42.0  MCV 96.8  PLT 281   Basic Metabolic Panel: Recent Labs  Lab 02/23/23 1555 02/27/23 1842  NA 140 137  K 3.6 3.4*  CL 105 99  CO2 24 22  GLUCOSE 113* 281*  BUN 18 25*  CREATININE 2.28* 2.55*  CALCIUM 8.9 9.0   GFR: Estimated Creatinine Clearance: 46.2 mL/min (A) (by C-G formula based on SCr of 2.55 mg/dL (H)). Liver Function Tests: No results for input(s): "AST", "ALT", "ALKPHOS", "BILITOT", "PROT", "ALBUMIN" in the last 168 hours. No results for input(s): "LIPASE", "AMYLASE" in the last 168 hours. No results for input(s): "AMMONIA" in the last 168 hours. Coagulation Profile: No results for input(s): "INR", "PROTIME" in the last 168 hours. Cardiac Enzymes: No results for input(s): "CKTOTAL", "CKMB", "CKMBINDEX", "TROPONINI" in the last 168 hours. BNP (last 3 results) Recent Labs    03/21/22 1452 07/23/22 1655  PROBNP 18.0 31.0   HbA1C: No results for input(s): "HGBA1C" in the last 72 hours. CBG: No results for input(s): "GLUCAP" in the last 168 hours. Lipid Profile: No results for input(s): "CHOL",  "HDL", "LDLCALC", "TRIG", "CHOLHDL", "LDLDIRECT" in the last 72 hours. Thyroid Function Tests: No results for input(s): "TSH", "T4TOTAL", "FREET4", "T3FREE", "THYROIDAB" in the last 72 hours. Anemia Panel: No results for input(s): "VITAMINB12", "FOLATE", "FERRITIN", "TIBC", "IRON", "RETICCTPCT" in the last 72 hours. Urine analysis:    Component Value Date/Time   COLORURINE STRAW (A) 02/13/2022 1637   APPEARANCEUR CLEAR 02/13/2022 1637   APPEARANCEUR Clear 07/17/2020 1655   LABSPEC 1.015 02/13/2022 1637   PHURINE 5.0 02/13/2022 1637   GLUCOSEU >=500 (A) 02/13/2022 1637   HGBUR NEGATIVE 02/13/2022 1637   BILIRUBINUR NEGATIVE 02/13/2022 1637   BILIRUBINUR negative 01/30/2022 1705   BILIRUBINUR Negative 07/17/2020 1655   KETONESUR NEGATIVE 02/13/2022 1637   PROTEINUR NEGATIVE 02/13/2022 1637   UROBILINOGEN 0.2 01/30/2022 1705   UROBILINOGEN 1.0 06/20/2013 1032   NITRITE NEGATIVE 02/13/2022 1637   LEUKOCYTESUR NEGATIVE 02/13/2022 1637   Sepsis Labs: @LABRCNTIP (procalcitonin:4,lacticidven:4) )No results found for this or any previous visit (from the past 240 hour(s)).   Radiological Exams on Admission: CT Chest Wo Contrast  Result Date: 02/27/2023 CLINICAL DATA:  Chronic dyspnea EXAM: CT CHEST WITHOUT CONTRAST TECHNIQUE: Multidetector CT imaging of the chest was performed following the standard protocol without IV contrast. RADIATION DOSE REDUCTION: This exam was performed according to the departmental dose-optimization program which includes automated exposure control, adjustment of the mA and/or kV according to patient size and/or use of iterative reconstruction technique. COMPARISON:  12/24/2022 FINDINGS: Cardiovascular: No significant coronary artery calcification. Stable cardiomegaly. Left subclavian dual lead pacemaker in place with leads within the right atrium and right ventricle toward the apex. No pericardial effusion. Central pulmonary arteries are of normal caliber. Thoracic  aorta is unremarkable. Mediastinum/Nodes: No enlarged mediastinal or axillary lymph nodes. Thyroid gland, trachea, and esophagus demonstrate no significant findings. Small hiatal hernia. Lungs/Pleura: Mild bibasilar dependent atelectasis. Lungs are otherwise clear. No  pneumothorax or pleural effusion. Upper Abdomen: No acute abnormality. Musculoskeletal: No chest wall mass or suspicious bone lesions identified. IMPRESSION: 1. No acute intrathoracic pathology identified. No definite radiographic explanation for the patient's reported symptoms. 2. Stable cardiomegaly. 3. Small hiatal hernia. Electronically Signed   By: Helyn Numbers M.D.   On: 02/27/2023 23:10   DG Chest 2 View  Result Date: 02/27/2023 CLINICAL DATA:  Acute on chronic dyspnea EXAM: CHEST - 2 VIEW COMPARISON:  Chest radiograph dated 07/23/2022 FINDINGS: Lines/tubes: Left chest wall ICD leads project over the right atrium and ventricle. Lungs: Left basilar patchy opacity. Pleura: No pneumothorax or pleural effusion. Heart/mediastinum: Similar enlarged cardiomediastinal silhouette. Bones: No acute osseous abnormality. Partially imaged left humeral hardware appears intact. IMPRESSION: 1. Left basilar patchy opacity, which may represent atelectasis or infection. 2. Similar cardiomegaly. Electronically Signed   By: Agustin Cree M.D.   On: 02/27/2023 20:37    EKG: Independently reviewed. Sinus rhythm, RAD, non-specific IVCD.   Assessment/Plan   1. Acute on chronic systolic CHF   - Diurese with IV Lasix, monitor weight and I/Os, continue beta-blocker and Entresto, monitor electrolytes and renal function    2. COPD  - Not in exacerbation on admission  - Resume ICS-LAMA-LABA and as-needed albuterol    3. Insulin-dependent DM  - A1c was 9.1% earlier this month  - Check CBGs and use long- and short-acting insulin    4. Anxiety  - Continue Zoloft and as-needed Klonopin    5. CKD 3B  - SCr is 2.55 on admission, up from 2.28 a few days ago  -  Renally-dose medications, monitor    DVT prophylaxis: sq heparin  Code Status: Full  Level of Care: Level of care: Telemetry Cardiac Family Communication: none present  Disposition Plan:  Patient is from: Home  Anticipated d/c is to: Home Anticipated d/c date is: 9/21 or 03/01/23  Patient currently: Pending cardiology consultation  Consults called: Cardiology  Admission status: Observation    Briscoe Deutscher, MD [facility]  02/27/2023, 11:15 PM

## 2023-02-27 NOTE — Telephone Encounter (Signed)
Per Dr Gasper Lloyd No need to direct admit through HF service Most recent OV-HF stable-numbers/vitals/labs stable. If pt requests admission-will need to report to ER for further evaluation-   Pt aware and voiced understanding

## 2023-02-27 NOTE — ED Provider Notes (Signed)
[place] EMERGENCY DEPARTMENT AT [facility] Provider Note   CSN: [id number] Arrival date & time: 02/27/23  1718    History  Chief Complaint  Patient presents with   Shortness of Breath    [name] is a 57 y.o. male history of COPD, obesity, prior tobacco use, CHF, last EF 30%, ICD here for evaluation of fluid overload.  Patient states he has had progressive DOE over the last 2 months however worse over the last 4 days.  He saw cardiology in clinic 4 days ago and was started on additional torsemide, 80 mg.  He did not want to be admitted at that time however according to him it was recommended he was admitted for IV diuresis.  He states he continues to feel short of breath.  Feels like he is distended in his abdomen, lower extremities.  States he has a chronic cough which is nonproductive.  No fever.  He states he has chronic chest pain which is unchanged.  He feels like his lower extremities are swollen beyond the baseline however states he does not typically get swelling in his lower extremities.  He is up 20 pounds over the last month according to patient.  He has been compliant his medications.  No history of PE or DVT.  He is not anticoagulated.  No recent falls or injuries.   HPI     Home Medications Prior to Admission medications   Medication Sig Start Date End Date Taking? Authorizing Provider  acetaminophen (TYLENOL) 500 MG tablet Take 1,000 mg by mouth 2 (two) times daily.   Yes [provider]  amiodarone (PACERONE) 200 MG tablet Take 1 tablet (200 mg total) by mouth daily. 08/15/22  Yes Sheilah Pigeon, PA-C  aspirin 81 MG tablet Take 81 mg by mouth daily.   Yes [provider]  atorvastatin (LIPITOR) 10 MG tablet Take 1 tablet (10 mg total) by mouth daily. 01/27/23  Yes Marcine Matar, MD  cetirizine (EQ ALLERGY RELIEF, CETIRIZINE,) 10 MG tablet Take 1 tablet (10 mg total) by mouth daily. 10/30/22  Yes Ambs, Norvel Richards, FNP  cholecalciferol  (VITAMIN D3) 25 MCG (1000 UNIT) tablet Take 1,000 Units by mouth daily.   Yes [provider]  clonazePAM (KLONOPIN) 0.5 MG tablet Take 1 tablet (0.5 mg total) by mouth 2 (two) times daily as needed for anxiety. Patient taking differently: Take 0.5 mg by mouth 2 (two) times daily. 02/17/23 02/17/24 Yes Stasia Cavalier, MD  dapagliflozin propanediol (FARXIGA) 10 MG TABS tablet Take 1 tablet (10 mg total) by mouth daily before breakfast. 05/26/22  Yes [name], Aditya, DO  EPINEPHrine 0.3 mg/0.3 mL IJ SOAJ injection Inject 0.3 mg into the muscle as needed. 01/30/22  Yes Alfonse Spruce, MD  famotidine (PEPCID) 20 MG tablet TAKE 1 TABLET BY MOUTH 1 TO 2 TIMES DAILY AS NEEDED 01/26/23  Yes Ambs, Norvel Richards, FNP  fluticasone (FLONASE) 50 MCG/ACT nasal spray Use 1-2 sprays in each nostril once a day as needed for nasal congestion. 01/30/22  Yes Alfonse Spruce, MD  Fluticasone-Umeclidin-Vilant (TRELEGY ELLIPTA IN) Inhale 1 puff into the lungs daily.   Yes [provider]  gabapentin (NEURONTIN) 300 MG capsule Take 1 capsule by mouth at bedtime 10/15/22  Yes Marcine Matar, MD  insulin aspart (NOVOLOG) 100 UNIT/ML injection Inject 24 Units into the skin 3 (three) times daily with meals.   Yes [provider]  insulin glargine (LANTUS SOLOSTAR) 100 UNIT/ML Solostar  Pen Inject 36 Units into the skin daily. 12/12/22  Yes Carlus Pavlov, MD  ketoconazole (NIZORAL) 2 % cream Apply 1 Application topically daily. 02/05/23  Yes Standiford, Jenelle Mages, DPM  methocarbamol (ROBAXIN) 500 MG tablet Take 500 mg by mouth daily.   Yes [provider]  metoprolol succinate (TOPROL-XL) 50 MG 24 hr tablet Take 1 tablet (50 mg total) by mouth daily. 08/22/22  Yes [name], Aditya, DO  sacubitril-valsartan (ENTRESTO) 24-26 MG Take 1 tablet by mouth 2 (two) times daily. 01/07/23  Yes [name], Aditya, DO  sertraline (ZOLOFT) 25 MG tablet Take 1 tablet (25 mg total) by mouth daily. 01/13/23  01/13/24 Yes Stasia Cavalier, MD  spironolactone (ALDACTONE) 25 MG tablet Take 0.5 tablets (12.5 mg total) by mouth daily. 05/26/22  Yes [name], Aditya, DO  tamsulosin (FLOMAX) 0.4 MG CAPS capsule Take 2 capsules (0.8 mg total) by mouth daily. 01/27/23  Yes Marcine Matar, MD  torsemide (DEMADEX) 20 MG tablet Take 4 tablets (80 mg total) by mouth daily. 02/23/23  Yes [name], Aditya, DO  Accu-Chek Softclix Lancets lancets Use as instructed 02/01/22   Lonell Grandchild, MD  albuterol (VENTOLIN HFA) 108 (90 Base) MCG/ACT inhaler Inhale 2 puffs into the lungs every 6 (six) hours as needed for wheezing or shortness of breath. Patient not taking: Reported on 02/23/2023 10/24/22   Glenford Bayley, NP  Blood Glucose Monitoring Suppl (ACCU-CHEK GUIDE) w/Device KIT UAD 02/01/22   Lonell Grandchild, MD  Blood Glucose Monitoring Suppl DEVI 1 each by Does not apply route in the morning, at noon, and at bedtime. May substitute to any manufacturer covered by patient's insurance. 12/23/22   Carlus Pavlov, MD  clobetasol cream (TEMOVATE) 0.05 % Apply 1 Application topically as needed.    [provider]  Continuous Blood Gluc Sensor (FREESTYLE LIBRE 2 SENSOR) MISC Use to check blood sugar TID. Change sensor once every 14 days. E11.65 07/01/22   Marcine Matar, MD  fluticasone (FLOVENT HFA) 44 MCG/ACT inhaler Inhale 2 puffs into the lungs in the morning and at bedtime. Patient not taking: Reported on 02/23/2023 11/10/22   Glenford Bayley, NP  glucose blood (ACCU-CHEK GUIDE) test strip Use as instructed 02/01/22   Lonell Grandchild, MD  glucose blood (FREESTYLE LITE) test strip Use as instructed.  Please make sure this is compatible with [facility] 2 reader. 07/14/22   Marcine Matar, MD  Insulin Pen Needle (PEN NEEDLES) 31G X 8 MM MISC UAD 03/01/22   Marcine Matar, MD  ipratropium-albuterol (DUONEB) 0.5-2.5 (3) MG/3ML SOLN Take 3 mLs by nebulization every 6 (six) hours as needed. Patient not  taking: Reported on 02/23/2023 03/21/22   Lupita Leash, MD  Lancet Devices [facility]) lancets 1 each by Other route 3 (three) times daily. 10/20/14   Dessa Phi, MD      Allergies    Bee venom and Trulicity [dulaglutide]    Review of Systems   Review of Systems  Constitutional: Negative.   HENT: Negative.    Respiratory:  Positive for cough and shortness of breath.   Cardiovascular:  Positive for chest pain (chronic) and leg swelling.  Gastrointestinal: Negative.   Genitourinary: Negative.   Musculoskeletal: Negative.   Skin: Negative.   Neurological: Negative.   All other systems reviewed and are negative.   Physical Exam Updated Vital Signs BP (!) 113/91 (BP Location: Right Arm)   Pulse 64   Temp 97.8 F (36.6 C) (Oral)   Resp  18   Ht 6\' 1"  (1.854 m)   Wt (!) 138.8 kg   SpO2 97%   BMI 40.37 kg/m  Physical Exam Vitals and nursing note reviewed.  Constitutional:      General: He is not in acute distress.    Appearance: He is well-developed. He is obese. He is not ill-appearing, toxic-appearing or diaphoretic.  HENT:     Head: Normocephalic and atraumatic.  Eyes:     Pupils: Pupils are equal, round, and reactive to light.  Cardiovascular:     Rate and Rhythm: Normal rate and regular rhythm.     Pulses: Normal pulses.     Heart sounds: Normal heart sounds.  Pulmonary:     Effort: Pulmonary effort is normal. No respiratory distress.     Comments: Distant breath sounds.  Pauses every few words to catch his breath Abdominal:     General: There is no distension.     Palpations: Abdomen is soft.  Musculoskeletal:        General: Normal range of motion.     Cervical back: Normal range of motion and neck supple.     Right lower leg: No tenderness. Edema present.     Left lower leg: No tenderness. Edema present.     Comments: 1+ pitting edema to BLE  Skin:    General: Skin is warm and dry.     Capillary Refill: Capillary refill takes less than 2  seconds.     Comments: No obvious rashes or lesions to exposed skin  Neurological:     General: No focal deficit present.     Mental Status: He is alert and oriented to person, place, and time.    ED Results / Procedures / Treatments   Labs (all labs ordered are listed, but only abnormal results are displayed) Labs Reviewed  BASIC METABOLIC PANEL - Abnormal; Notable for the following components:      Result Value   Potassium 3.4 (*)    Glucose, Bld 281 (*)    BUN 25 (*)    Creatinine, Ser 2.55 (*)    GFR, Estimated 28 (*)    Anion gap 16 (*)    All other components within normal limits  CBC  BRAIN NATRIURETIC PEPTIDE  MAGNESIUM  BASIC METABOLIC PANEL  CBC  TROPONIN I (HIGH SENSITIVITY)  TROPONIN I (HIGH SENSITIVITY)    EKG EKG Interpretation Date/Time:  Friday February 27 2023 18:46:46 EDT Ventricular Rate:  67 PR Interval:  196 QRS Duration:  126 QT Interval:  344 QTC Calculation: 363 R Axis:   252  Text Interpretation: Normal sinus rhythm Right superior axis deviation Non-specific intra-ventricular conduction block Nonspecific T wave abnormality Abnormal ECG When compared with ECG of 14-Jun-2022 17:59, PREVIOUS ECG IS PRESENT since last tracing no significant change Confirmed by Eber Hong ([id number]) on 02/27/2023 7:55:39 PM  Radiology CT Chest Wo Contrast  Result Date: 02/27/2023 CLINICAL DATA:  Chronic dyspnea EXAM: CT CHEST WITHOUT CONTRAST TECHNIQUE: Multidetector CT imaging of the chest was performed following the standard protocol without IV contrast. RADIATION DOSE REDUCTION: This exam was performed according to the departmental dose-optimization program which includes automated exposure control, adjustment of the mA and/or kV according to patient size and/or use of iterative reconstruction technique. COMPARISON:  12/24/2022 FINDINGS: Cardiovascular: No significant coronary artery calcification. Stable cardiomegaly. Left subclavian dual lead pacemaker in place  with leads within the right atrium and right ventricle toward the apex. No pericardial effusion. Central pulmonary arteries are of normal  caliber. Thoracic aorta is unremarkable. Mediastinum/Nodes: No enlarged mediastinal or axillary lymph nodes. Thyroid gland, trachea, and esophagus demonstrate no significant findings. Small hiatal hernia. Lungs/Pleura: Mild bibasilar dependent atelectasis. Lungs are otherwise clear. No pneumothorax or pleural effusion. Upper Abdomen: No acute abnormality. Musculoskeletal: No chest wall mass or suspicious bone lesions identified. IMPRESSION: 1. No acute intrathoracic pathology identified. No definite radiographic explanation for the patient's reported symptoms. 2. Stable cardiomegaly. 3. Small hiatal hernia. Electronically Signed   By: Helyn Numbers M.D.   On: 02/27/2023 23:10   DG Chest 2 View  Result Date: 02/27/2023 CLINICAL DATA:  Acute on chronic dyspnea EXAM: CHEST - 2 VIEW COMPARISON:  Chest radiograph dated 07/23/2022 FINDINGS: Lines/tubes: Left chest wall ICD leads project over the right atrium and ventricle. Lungs: Left basilar patchy opacity. Pleura: No pneumothorax or pleural effusion. Heart/mediastinum: Similar enlarged cardiomediastinal silhouette. Bones: No acute osseous abnormality. Partially imaged left humeral hardware appears intact. IMPRESSION: 1. Left basilar patchy opacity, which may represent atelectasis or infection. 2. Similar cardiomegaly. Electronically Signed   By: Agustin Cree M.D.   On: 02/27/2023 20:37    Procedures Procedures    Medications Ordered in ED Medications  insulin aspart (novoLOG) injection 0-9 Units (has no administration in time range)  insulin glargine-yfgn (SEMGLEE) injection 20 Units (has no administration in time range)  insulin aspart (novoLOG) injection 0-5 Units (has no administration in time range)  furosemide (LASIX) injection 80 mg (has no administration in time range)  potassium chloride SA (KLOR-CON M) CR tablet  20 mEq (has no administration in time range)  heparin injection 5,000 Units (has no administration in time range)  sodium chloride flush (NS) 0.9 % injection 3 mL (has no administration in time range)  acetaminophen (TYLENOL) tablet 650 mg (has no administration in time range)    Or  acetaminophen (TYLENOL) suppository 650 mg (has no administration in time range)  senna-docusate (Senokot-S) tablet 1 tablet (has no administration in time range)  ondansetron (ZOFRAN) tablet 4 mg (has no administration in time range)    Or  ondansetron (ZOFRAN) injection 4 mg (has no administration in time range)  ipratropium-albuterol (DUONEB) 0.5-2.5 (3) MG/3ML nebulizer solution 3 mL (has no administration in time range)  amiodarone (PACERONE) tablet 200 mg (has no administration in time range)  atorvastatin (LIPITOR) tablet 10 mg (has no administration in time range)  metoprolol succinate (TOPROL-XL) 24 hr tablet 50 mg (has no administration in time range)  sacubitril-valsartan (ENTRESTO) 24-26 mg per tablet (has no administration in time range)  spironolactone (ALDACTONE) tablet 12.5 mg (has no administration in time range)  sertraline (ZOLOFT) tablet 25 mg (has no administration in time range)  tamsulosin (FLOMAX) capsule 0.8 mg (has no administration in time range)  clonazePAM (KLONOPIN) tablet 0.5 mg (has no administration in time range)  gabapentin (NEURONTIN) capsule 300 mg (has no administration in time range)  methocarbamol (ROBAXIN) tablet 500 mg (has no administration in time range)  Fluticasone-Umeclidin-Vilant 100-62.5-25 MCG/ACT AEPB (has no administration in time range)  albuterol (VENTOLIN HFA) 108 (90 Base) MCG/ACT inhaler 2 puff (has no administration in time range)    ED Course/ Medical Decision Making/ A&P Clinical Course as of 02/27/23 2323  Fri Feb 27, 2023  2247 Dr. Hyacinth Meeker with Cards. Rec medicine admit, 120 IV lasix, Cards will follow along [BH]    Clinical Course User  Index [BH] Dionicia Cerritos A, PA-C   58 year old here for evaluation of shortness of breath.  Sounds like's been going on  for approximately 2 months however worse over the last 4 days with increasing diffuse edema.  He states his weight is up 10 pounds over the last few weeks.  He was seen by cardiology 4 days ago who changed his Lasix to 80 torsemide which did not help, he still feels short of breath.  He feels like his legs and abdomen are edematous.  Of note he does have history of COPD, noncompliant with inhalers as get some palpitations and chest pain.  He states he called his cardiologist who recommended he come here for admission for diuresis if he is still symptomatic.  On arrival patient is afebrile, nonseptic, not ill-appearing.  He has no hypoxia however does become dyspneic with minimal exertion.  He has some 1+ edema to his lower extremities.  He admits to PND and orthopnea.  ICD shows read score of 40 per patient and cardiology note.  Will plan on labs, imaging and reassess  Labs and imaging personally viewed and interpreted:  CBC without leukocytosis metabolic panel potassium 3.4, creatinine 2.55, anion gap 16 Troponin 11 BNP 24 Chest x-ray possible opacity with atelectasis EKG without ischemic changes  Patient reassessed.  He was ambulatory with 93% on room air however was dyspneic with exertion according to nursing staff and requested to get back to the bed.  Will touch base with cardiology.  Will get CT chest to make sure this is not pneumonia causing shortness of breath  Discussed with Dr. Hyacinth Meeker With cardiology.  Recommends medicine admission, IV Lasix, cards will follow along  Discussed with Dr. Antionette Char with medicine who agrees for admission  The patient appears reasonably stabilized for admission considering the current resources, flow, and capabilities available in the ED at this time, and I doubt any other [facility] requiring further screening and/or treatment in the ED prior to  admission.   Attending, Dr. Silverio Lay has assessed patient at bedside, agrees with the treatment, plan and disposition                                   Medical Decision Making Amount and/or Complexity of Data Reviewed Independent Historian: friend External Data Reviewed: labs, radiology, ECG and notes. Labs: ordered. Decision-making details documented in ED Course. Radiology: ordered and independent interpretation performed. Decision-making details documented in ED Course. ECG/medicine tests: ordered and independent interpretation performed. Decision-making details documented in ED Course.  Risk OTC drugs. Prescription drug management. Parenteral controlled substances. Decision regarding hospitalization. Diagnosis or treatment significantly limited by social determinants of health.         Final Clinical Impression(s) / ED Diagnoses Final diagnoses:  Acute on chronic congestive heart failure, unspecified heart failure type [facility])    Rx / DC Orders ED Discharge Orders     None         Baelyn Doring A, PA-C 02/27/23 2323    Charlynne Pander, MD 02/27/23 [phone]

## 2023-02-27 NOTE — ED Notes (Signed)
Pt ambulated. O2 sat between 93-95. Pt stated he does not feel safe to go home tonight due to advice from his cardiologist about admission. RN aware.

## 2023-02-27 NOTE — ED Triage Notes (Signed)
The pt has had some difficulty breathing for 2 months worse for the past 3-4 days.  He saw his doctor  2-3 days ago and the doctor  wanted to be admitted then but the pt did not want to be admitted.  Today his breathing is worse with feet and ankle swelling on home 02

## 2023-02-28 DIAGNOSIS — E1165 Type 2 diabetes mellitus with hyperglycemia: Secondary | ICD-10-CM | POA: Diagnosis not present

## 2023-02-28 DIAGNOSIS — E1122 Type 2 diabetes mellitus with diabetic chronic kidney disease: Secondary | ICD-10-CM | POA: Diagnosis not present

## 2023-02-28 DIAGNOSIS — R0602 Shortness of breath: Secondary | ICD-10-CM | POA: Diagnosis not present

## 2023-02-28 DIAGNOSIS — Z95810 Presence of automatic (implantable) cardiac defibrillator: Secondary | ICD-10-CM | POA: Diagnosis not present

## 2023-02-28 DIAGNOSIS — E785 Hyperlipidemia, unspecified: Secondary | ICD-10-CM | POA: Diagnosis not present

## 2023-02-28 DIAGNOSIS — I509 Heart failure, unspecified: Secondary | ICD-10-CM | POA: Insufficient documentation

## 2023-02-28 DIAGNOSIS — I5023 Acute on chronic systolic (congestive) heart failure: Secondary | ICD-10-CM | POA: Diagnosis not present

## 2023-02-28 DIAGNOSIS — N179 Acute kidney failure, unspecified: Secondary | ICD-10-CM | POA: Diagnosis not present

## 2023-02-28 DIAGNOSIS — E876 Hypokalemia: Secondary | ICD-10-CM | POA: Diagnosis not present

## 2023-02-28 DIAGNOSIS — I110 Hypertensive heart disease with heart failure: Secondary | ICD-10-CM | POA: Diagnosis not present

## 2023-02-28 DIAGNOSIS — R0600 Dyspnea, unspecified: Secondary | ICD-10-CM | POA: Diagnosis not present

## 2023-02-28 DIAGNOSIS — Z91030 Bee allergy status: Secondary | ICD-10-CM | POA: Diagnosis not present

## 2023-02-28 DIAGNOSIS — Z6841 Body Mass Index (BMI) 40.0 and over, adult: Secondary | ICD-10-CM | POA: Diagnosis not present

## 2023-02-28 DIAGNOSIS — Z888 Allergy status to other drugs, medicaments and biological substances status: Secondary | ICD-10-CM | POA: Diagnosis not present

## 2023-02-28 DIAGNOSIS — E1169 Type 2 diabetes mellitus with other specified complication: Secondary | ICD-10-CM | POA: Diagnosis not present

## 2023-02-28 DIAGNOSIS — F419 Anxiety disorder, unspecified: Secondary | ICD-10-CM | POA: Diagnosis not present

## 2023-02-28 DIAGNOSIS — I130 Hypertensive heart and chronic kidney disease with heart failure and stage 1 through stage 4 chronic kidney disease, or unspecified chronic kidney disease: Secondary | ICD-10-CM | POA: Diagnosis not present

## 2023-02-28 DIAGNOSIS — K449 Diaphragmatic hernia without obstruction or gangrene: Secondary | ICD-10-CM | POA: Diagnosis not present

## 2023-02-28 DIAGNOSIS — Z7951 Long term (current) use of inhaled steroids: Secondary | ICD-10-CM | POA: Diagnosis not present

## 2023-02-28 DIAGNOSIS — I5020 Unspecified systolic (congestive) heart failure: Secondary | ICD-10-CM | POA: Insufficient documentation

## 2023-02-28 DIAGNOSIS — J9811 Atelectasis: Secondary | ICD-10-CM | POA: Diagnosis not present

## 2023-02-28 DIAGNOSIS — Z91148 Patient's other noncompliance with medication regimen for other reason: Secondary | ICD-10-CM | POA: Diagnosis not present

## 2023-02-28 DIAGNOSIS — Z7982 Long term (current) use of aspirin: Secondary | ICD-10-CM | POA: Diagnosis not present

## 2023-02-28 DIAGNOSIS — Z87891 Personal history of nicotine dependence: Secondary | ICD-10-CM | POA: Diagnosis not present

## 2023-02-28 DIAGNOSIS — Z79899 Other long term (current) drug therapy: Secondary | ICD-10-CM | POA: Diagnosis not present

## 2023-02-28 DIAGNOSIS — N1832 Chronic kidney disease, stage 3b: Secondary | ICD-10-CM | POA: Diagnosis not present

## 2023-02-28 DIAGNOSIS — Z794 Long term (current) use of insulin: Secondary | ICD-10-CM | POA: Diagnosis not present

## 2023-02-28 DIAGNOSIS — Z9049 Acquired absence of other specified parts of digestive tract: Secondary | ICD-10-CM | POA: Diagnosis not present

## 2023-02-28 DIAGNOSIS — E66813 Obesity, class 3: Secondary | ICD-10-CM

## 2023-02-28 DIAGNOSIS — G8929 Other chronic pain: Secondary | ICD-10-CM | POA: Diagnosis not present

## 2023-02-28 DIAGNOSIS — R918 Other nonspecific abnormal finding of lung field: Secondary | ICD-10-CM | POA: Diagnosis not present

## 2023-02-28 DIAGNOSIS — I428 Other cardiomyopathies: Secondary | ICD-10-CM | POA: Diagnosis not present

## 2023-02-28 DIAGNOSIS — J4489 Other specified chronic obstructive pulmonary disease: Secondary | ICD-10-CM | POA: Diagnosis not present

## 2023-02-28 LAB — GLUCOSE, CAPILLARY
Glucose-Capillary: 148 mg/dL — ABNORMAL HIGH (ref 70–99)
Glucose-Capillary: 154 mg/dL — ABNORMAL HIGH (ref 70–99)
Glucose-Capillary: 185 mg/dL — ABNORMAL HIGH (ref 70–99)
Glucose-Capillary: 194 mg/dL — ABNORMAL HIGH (ref 70–99)
Glucose-Capillary: 230 mg/dL — ABNORMAL HIGH (ref 70–99)
Glucose-Capillary: 239 mg/dL — ABNORMAL HIGH (ref 70–99)

## 2023-02-28 LAB — BASIC METABOLIC PANEL
Anion gap: 11 (ref 5–15)
BUN: 26 mg/dL — ABNORMAL HIGH (ref 6–20)
CO2: 27 mmol/L (ref 22–32)
Calcium: 8.9 mg/dL (ref 8.9–10.3)
Chloride: 99 mmol/L (ref 98–111)
Creatinine, Ser: 2.73 mg/dL — ABNORMAL HIGH (ref 0.61–1.24)
GFR, Estimated: 26 mL/min — ABNORMAL LOW (ref >60)
Glucose, Bld: 217 mg/dL — ABNORMAL HIGH (ref 70–99)
Potassium: 3.3 mmol/L — ABNORMAL LOW (ref 3.5–5.1)
Sodium: 137 mmol/L (ref 135–145)

## 2023-02-28 LAB — CBC
HCT: 40.8 % (ref 39.0–52.0)
Hemoglobin: 13.1 g/dL (ref 13.0–17.0)
MCH: 30.7 pg (ref 26.0–34.0)
MCHC: 32.1 g/dL (ref 30.0–36.0)
MCV: 95.6 fL (ref 80.0–100.0)
Platelets: 270 10*3/uL (ref 150–400)
RBC: 4.27 MIL/uL (ref 4.22–5.81)
RDW: 14.7 % (ref 11.5–15.5)
WBC: 4.9 10*3/uL (ref 4.0–10.5)
nRBC: 0 % (ref 0.0–0.2)

## 2023-02-28 LAB — MAGNESIUM: Magnesium: 2.3 mg/dL (ref 1.7–2.4)

## 2023-02-28 MED ORDER — FUROSEMIDE 10 MG/ML IJ SOLN
80.0000 mg | Freq: Two times a day (BID) | INTRAMUSCULAR | Status: DC
  Administered 2023-02-28 – 2023-03-02 (×5): 80 mg via INTRAVENOUS
  Filled 2023-02-28 (×5): qty 8

## 2023-02-28 MED ORDER — POTASSIUM CHLORIDE CRYS ER 20 MEQ PO TBCR
40.0000 meq | EXTENDED_RELEASE_TABLET | Freq: Once | ORAL | Status: AC
  Administered 2023-02-28: 40 meq via ORAL
  Filled 2023-02-28: qty 2

## 2023-02-28 MED ORDER — POTASSIUM CHLORIDE CRYS ER 20 MEQ PO TBCR
40.0000 meq | EXTENDED_RELEASE_TABLET | ORAL | Status: AC
  Administered 2023-02-28: 40 meq via ORAL
  Filled 2023-02-28: qty 2

## 2023-02-28 MED ORDER — CLONAZEPAM 0.5 MG PO TABS
0.5000 mg | ORAL_TABLET | ORAL | Status: AC
  Administered 2023-02-28: 0.5 mg via ORAL
  Filled 2023-02-28: qty 1

## 2023-02-28 MED ORDER — METHOCARBAMOL 500 MG PO TABS
500.0000 mg | ORAL_TABLET | ORAL | Status: AC
  Administered 2023-02-28: 500 mg via ORAL
  Filled 2023-02-28: qty 1

## 2023-02-28 NOTE — Assessment & Plan Note (Signed)
Patient with no clinical signs of exacerbation.  Plan to continue bronchodilator therapy.

## 2023-02-28 NOTE — Assessment & Plan Note (Addendum)
Hypokalemia.   Volume status has improved. Renal function with serum cr at 2,58 with K at 3,9 and serum bicarbonate at 24.  Na 135   He had one dose of IV furosemide this am 80 mg, will transition to po torsemide in am, he was recently changed from furosemide to torsemide 09/16 heart failure clinic. Continue with spironolactone and SGLT 2 inh.

## 2023-02-28 NOTE — Assessment & Plan Note (Addendum)
Echocardiogram with reduced LV systolic function EF 20 to 25%, global hypokinesis, mild dilated internal cavity, moderate LVH, RV systolic function preserved, RVSP 19,3 mmHg. No significant valvular disease.   Patient was placed on furosemide IV for diuresis, negative fluid balance was achieved, -8,638 ml, with significant improvement in his symptoms.   Metoprolol, entresto, spironolactone, SGLT 2 inh Resume torsemide 80 mg po daily, with instructions to increase to bid 80 mg in case of volume overload, 2 to 3 lbs weight increase in 24 hrs or 5 lbs in 7 days.   History of ventricular tachycardia. Sp AICD Continue with amiodarone.

## 2023-02-28 NOTE — Consult Note (Signed)
Cardiology Consultation:   Patient ID: [name] MRN: [id number]; DOB: [birth year]  Admit date: 02/27/2023 Date of Consult: 02/28/2023  Primary Care Provider: Marcine Matar, MD Primary Cardiologist: Olga Millers, MD  Primary Electrophysiologist:  Lanier Prude, MD    Patient Profile:   [name] is a 57 y.o. male with a hx of obesity, diabetes, hypertension, nonischemic cardiomyopathy with ICD, CKD, and COPD who is being seen today for the evaluation of shortness of breath at the request of emergency department.  History of Present Illness:   [name] is a 57 y.o. male who has had worsening dyspnea over the last couple weeks.  He also feels like he has been retaining fluid around his abdomen and states that he has been having lower extremity edema which is not typical for him.  Has not had any chest pain or any other acute symptoms.  Also has not been using his inhalers for his COPD.  Follows with advanced heart failure and was seen in clinic recently and started on torsemide 80 mg.  At that time HF doctor wanted him admitted.  However he preferred to try few days at home and his symptoms continue to progress.   In the emergency department, he remained stable with elevated creatinine 2.55 and rest of lab workup was unremarkable.     Past Medical History:  Diagnosis Date   AICD (automatic cardioverter/defibrillator) present    Chronic bronchitis (HCC)    "get it most q yr" (12/23/2013)   Chronic systolic (congestive) heart failure (HCC)    Fracture of left humerus    a. 07/2013.   GERD (gastroesophageal reflux disease)    not at present time   Heart murmur    "born w/it"    History of renal calculi    Hypertension    Kidney stones    NICM (nonischemic cardiomyopathy) (HCC)    a. 07/2013 Echo: EF 20-25%, diff HK, Gr2 DD, mild MR, mod dil LA/RA. 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    Shockable heart rhythm detected by automated external defibrillator    Syncope    Type II diabetes mellitus (HCC) 2006     Past Surgical History:  Procedure Laterality Date   CARDIAC CATHETERIZATION  08/10/13   CHOLECYSTECTOMY  ~ 2010   IMPLANTABLE CARDIOVERTER DEFIBRILLATOR IMPLANT  12/23/2013   STJ Fortify ICD implanted by Dr Johney Frame for cardiomyopathy and syncope   IMPLANTABLE CARDIOVERTER DEFIBRILLATOR IMPLANT N/A 12/23/2013   Procedure: IMPLANTABLE CARDIOVERTER DEFIBRILLATOR IMPLANT;  Surgeon: Gardiner Rhyme, MD;  Location: [facility] CATH LAB;  Service: Cardiovascular;  Laterality: N/A;   INGUINAL HERNIA REPAIR Left 03/01/2018   Procedure: OPEN REPAIR OF LEFT INGUINAL HERNIA WITH MESH;  Surgeon: Gaynelle Adu, MD;  Location: [facility] ORS;  Service: General;  Laterality: Left;   LEAD REVISION/REPAIR N/A 05/15/2022   Procedure: LEAD REVISION/REPAIR;  Surgeon: Lanier Prude, MD;  Location: [facility] INVASIVE CV LAB;  Service: Cardiovascular;  Laterality: N/A;   LEFT HEART CATHETERIZATION WITH CORONARY ANGIOGRAM N/A 08/10/2013   Procedure: LEFT HEART CATHETERIZATION WITH CORONARY ANGIOGRAM;  Surgeon: Corky Crafts, MD;  Location: [facility] CATH LAB;  Service: Cardiovascular;  Laterality: N/A;   ORIF HUMERUS FRACTURE Left 08/12/2013   Procedure: OPEN REDUCTION INTERNAL FIXATION (ORIF) HUMERAL SHAFT FRACTURE;  Surgeon: Nadara Mustard, MD;  Location: [facility] OR;  Service: Orthopedics;  Laterality: Left;  Open Reduction Internal Fixation  Left Humerus   RIGHT/LEFT HEART CATH AND CORONARY ANGIOGRAPHY N/A 05/12/2022   Procedure: RIGHT/LEFT HEART CATH AND CORONARY ANGIOGRAPHY;  Surgeon: Dorthula Nettles, DO;  Location: [facility] INVASIVE CV LAB;  Service: Cardiovascular;  Laterality: N/A;   URETEROSCOPY     "laser for kidney stones"     Home Medications:  Prior to Admission medications   Medication Sig Start Date End Date Taking? Authorizing Provider  acetaminophen (TYLENOL) 500 MG tablet Take 1,000 mg by mouth 2 (two)  times daily.   Yes [provider]  amiodarone (PACERONE) 200 MG tablet Take 1 tablet (200 mg total) by mouth daily. 08/15/22  Yes Sheilah Pigeon, PA-C  aspirin 81 MG tablet Take 81 mg by mouth daily.   Yes [provider]  atorvastatin (LIPITOR) 10 MG tablet Take 1 tablet (10 mg total) by mouth daily. 01/27/23  Yes Marcine Matar, MD  cetirizine (EQ ALLERGY RELIEF, CETIRIZINE,) 10 MG tablet Take 1 tablet (10 mg total) by mouth daily. 10/30/22  Yes Ambs, Norvel Richards, FNP  cholecalciferol (VITAMIN D3) 25 MCG (1000 UNIT) tablet Take 1,000 Units by mouth daily.   Yes [provider]  clonazePAM (KLONOPIN) 0.5 MG tablet Take 1 tablet (0.5 mg total) by mouth 2 (two) times daily as needed for anxiety. Patient taking differently: Take 0.5 mg by mouth 2 (two) times daily. 02/17/23 02/17/24 Yes Stasia Cavalier, MD  dapagliflozin propanediol (FARXIGA) 10 MG TABS tablet Take 1 tablet (10 mg total) by mouth daily before breakfast. 05/26/22  Yes [name], Aditya, DO  EPINEPHrine 0.3 mg/0.3 mL IJ SOAJ injection Inject 0.3 mg into the muscle as needed. 01/30/22  Yes Alfonse Spruce, MD  famotidine (PEPCID) 20 MG tablet TAKE 1 TABLET BY MOUTH 1 TO 2 TIMES DAILY AS NEEDED 01/26/23  Yes Ambs, Norvel Richards, FNP  fluticasone (FLONASE) 50 MCG/ACT nasal spray Use 1-2 sprays in each nostril once a day as needed for nasal congestion. 01/30/22  Yes Alfonse Spruce, MD  Fluticasone-Umeclidin-Vilant (TRELEGY ELLIPTA IN) Inhale 1 puff into the lungs daily.   Yes [provider]  gabapentin (NEURONTIN) 300 MG capsule Take 1 capsule by mouth at bedtime 10/15/22  Yes Marcine Matar, MD  insulin aspart (NOVOLOG) 100 UNIT/ML injection Inject 24 Units into the skin 3 (three) times daily with meals.   Yes [provider]  insulin glargine (LANTUS SOLOSTAR) 100 UNIT/ML Solostar Pen Inject 36 Units into the skin daily. 12/12/22  Yes Carlus Pavlov, MD  ketoconazole (NIZORAL) 2 % cream  Apply 1 Application topically daily. 02/05/23  Yes Standiford, Jenelle Mages, DPM  methocarbamol (ROBAXIN) 500 MG tablet Take 500 mg by mouth daily.   Yes [provider]  metoprolol succinate (TOPROL-XL) 50 MG 24 hr tablet Take 1 tablet (50 mg total) by mouth daily. 08/22/22  Yes [name], Aditya, DO  sacubitril-valsartan (ENTRESTO) 24-26 MG Take 1 tablet by mouth 2 (two) times daily. 01/07/23  Yes [name], Aditya, DO  sertraline (ZOLOFT) 25 MG tablet Take 1 tablet (25 mg total) by mouth daily. 01/13/23 01/13/24 Yes Stasia Cavalier, MD  spironolactone (ALDACTONE) 25 MG tablet Take 0.5 tablets (12.5 mg total) by mouth daily. 05/26/22  Yes [name], Aditya, DO  tamsulosin (FLOMAX) 0.4 MG CAPS capsule Take 2 capsules (0.8 mg total) by mouth daily. 01/27/23  Yes Marcine Matar, MD  torsemide (DEMADEX) 20 MG tablet Take 4 tablets (80 mg total) by mouth daily. 02/23/23  Yes [name], Aditya, DO  Accu-Chek Softclix Lancets lancets  Use as instructed 02/01/22   Lonell Grandchild, MD  albuterol (VENTOLIN HFA) 108 (90 Base) MCG/ACT inhaler Inhale 2 puffs into the lungs every 6 (six) hours as needed for wheezing or shortness of breath. Patient not taking: Reported on 02/23/2023 10/24/22   Glenford Bayley, NP  Blood Glucose Monitoring Suppl (ACCU-CHEK GUIDE) w/Device KIT UAD 02/01/22   Lonell Grandchild, MD  Blood Glucose Monitoring Suppl DEVI 1 each by Does not apply route in the morning, at noon, and at bedtime. May substitute to any manufacturer covered by patient's insurance. 12/23/22   Carlus Pavlov, MD  clobetasol cream (TEMOVATE) 0.05 % Apply 1 Application topically as needed.    [provider]  Continuous Blood Gluc Sensor (FREESTYLE LIBRE 2 SENSOR) MISC Use to check blood sugar TID. Change sensor once every 14 days. E11.65 07/01/22   Marcine Matar, MD  fluticasone (FLOVENT HFA) 44 MCG/ACT inhaler Inhale 2 puffs into the lungs in the morning and at bedtime. Patient not taking:  Reported on 02/23/2023 11/10/22   Glenford Bayley, NP  glucose blood (ACCU-CHEK GUIDE) test strip Use as instructed 02/01/22   Lonell Grandchild, MD  glucose blood (FREESTYLE LITE) test strip Use as instructed.  Please make sure this is compatible with [facility] 2 reader. 07/14/22   Marcine Matar, MD  Insulin Pen Needle (PEN NEEDLES) 31G X 8 MM MISC UAD 03/01/22   Marcine Matar, MD  ipratropium-albuterol (DUONEB) 0.5-2.5 (3) MG/3ML SOLN Take 3 mLs by nebulization every 6 (six) hours as needed. Patient not taking: Reported on 02/23/2023 03/21/22   Lupita Leash, MD  Lancet Devices [facility]) lancets 1 each by Other route 3 (three) times daily. 10/20/14   Dessa Phi, MD    Inpatient Medications: Scheduled Meds:  amiodarone  200 mg Oral Daily   atorvastatin  10 mg Oral Daily   fluticasone furoate-vilanterol  1 puff Inhalation Daily   And   umeclidinium bromide  1 puff Inhalation Daily   furosemide  80 mg Intravenous BID   gabapentin  300 mg Oral QHS   heparin  5,000 Units Subcutaneous Q8H   insulin aspart  0-5 Units Subcutaneous QHS   insulin aspart  0-9 Units Subcutaneous TID WC   insulin glargine-yfgn  20 Units Subcutaneous Daily   metoprolol succinate  50 mg Oral Daily   potassium chloride  20 mEq Oral BID   sacubitril-valsartan  1 tablet Oral BID   sertraline  25 mg Oral Daily   sodium chloride flush  3 mL Intravenous Q12H   spironolactone  12.5 mg Oral Daily   tamsulosin  0.8 mg Oral Daily   Continuous Infusions:  PRN Meds: acetaminophen **OR** acetaminophen, albuterol, clonazePAM, ipratropium-albuterol, methocarbamol, ondansetron **OR** ondansetron (ZOFRAN) IV, senna-docusate  Allergies:    Allergies  Allergen Reactions   Bee Venom Anaphylaxis   Trulicity [Dulaglutide] Diarrhea    Extra dose of Trulicity caused cardiac arrest. Patient experience onset of diarrhea with using this medication - cleared after med stopped.    Social History:   Social  History   Socioeconomic History   Marital status: Single    Spouse name: Not on file   Number of children: Not on file   Years of education: Not on file   Highest education level: Not on file  Occupational History   Occupation: Disabled  Tobacco Use   Smoking status: Former    Current packs/day: 0.00    Average packs/day: 1 pack/day for 28.0 years (  28.0 ttl pk-yrs)    Types: Cigarettes    Start date: 06/03/1986    Quit date: 06/03/2014    Years since quitting: 8.7   Smokeless tobacco: Never  Vaping Use   Vaping status: Never Used  Substance and Sexual Activity   Alcohol use: Yes   Drug use: Yes    Types: Marijuana    Comment: occasionally   Sexual activity: Yes  Other Topics Concern   Not on file  Social History Narrative   Pt lives with his brother    Social Determinants of Health   Financial Resource Strain: High Risk (05/08/2022)   Overall Financial Resource Strain (CARDIA)    Difficulty of Paying Living Expenses: Very hard  Food Insecurity: No Food Insecurity (02/28/2023)   Hunger Vital Sign    Worried About Running Out of Food in the Last Year: Never true    Ran Out of Food in the Last Year: Never true  Transportation Needs: No Transportation Needs (02/28/2023)   PRAPARE - Administrator, Civil Service (Medical): No    Lack of Transportation (Non-Medical): No  Physical Activity: Not on file  Stress: Not on file  Social Connections: Moderately Integrated (03/12/2021)   Social Connection and Isolation Panel [NHANES]    Frequency of Communication with Friends and Family: More than three times a week    Frequency of Social Gatherings with Friends and Family: More than three times a week    Attends Religious Services: More than 4 times per year    Active Member of [employer] or Organizations: Yes    Attends Banker Meetings: Never    Marital Status: Never married  Intimate Partner Violence: Not At Risk (02/28/2023)   Humiliation, Afraid, Rape,  and Kick questionnaire    Fear of Current or Ex-Partner: No    Emotionally Abused: No    Physically Abused: No    Sexually Abused: No    Family History:    Family History  Problem Relation Age of Onset   Diabetes Father    Arthritis Father    Hypertension Father    Diabetes Mother    Arthritis Mother    Hypertension Mother    Hypertension Brother      Review of Systems: All other review of systems are negative unless otherwise noted in the HPI as above.   Physical Exam/Data:   Vitals:   02/27/23 2200 02/27/23 2215 02/28/23 0047 02/28/23 0403  BP: 113/82 121/84 (!) 109/91 123/62  Pulse: 63 (!) 59 66 (!) 52  Resp: (!) 21 15 (!) 22 18  Temp:   98.2 F (36.8 C) 97.8 F (36.6 C)  TempSrc:   Oral Oral  SpO2: 96% 90% 95% 96%  Weight:      Height:        Intake/Output Summary (Last 24 hours) at 02/28/2023 0650 Last data filed at 02/28/2023 0400 Gross per 24 hour  Intake 3 ml  Output 1200 ml  Net -1197 ml   Filed Weights   02/27/23 1827  Weight: (!) 138.8 kg   Body mass index is 40.37 kg/m.  General appearance: alert Neck: JVD elevated Lungs: scatter wheezing Heart: regular rate and rhythm, S1, S2 normal, no murmur, click, rub or gallop Neurologic: Grossly normal B/l lower extremity edema  EKG:  The EKG was personally reviewed and demonstrates: NSR Telemetry:  Telemetry was personally reviewed and demonstrates: NSR  Relevant CV Studies: None  Laboratory Data:  Chemistry Recent Labs  Lab 02/23/23  1555 02/27/23 1842 02/28/23 0255  NA 140 137 137  K 3.6 3.4* 3.3*  CL 105 99 99  CO2 24 22 27   GLUCOSE 113* 281* 217*  BUN 18 25* 26*  CREATININE 2.28* 2.55* 2.73*  CALCIUM 8.9 9.0 8.9  GFRNONAA 32* 28* 26*  ANIONGAP 11 16* 11    No results for input(s): "PROT", "ALBUMIN", "AST", "ALT", "ALKPHOS", "BILITOT" in the last 168 hours. Hematology Recent Labs  Lab 02/27/23 1842 02/28/23 0255  WBC 5.7 4.9  RBC 4.34 4.27  HGB 13.3 13.1  HCT 42.0 40.8   MCV 96.8 95.6  MCH 30.6 30.7  MCHC 31.7 32.1  RDW 14.6 14.7  PLT 281 270   Cardiac EnzymesNo results for input(s): "TROPONINI" in the last 168 hours. No results for input(s): "TROPIPOC" in the last 168 hours.  BNP Recent Labs  Lab 02/23/23 1555 02/27/23 2026  BNP 22.0 24.6    DDimer No results for input(s): "DDIMER" in the last 168 hours.  Radiology/Studies:  CT Chest Wo Contrast  Result Date: 02/27/2023 CLINICAL DATA:  Chronic dyspnea EXAM: CT CHEST WITHOUT CONTRAST TECHNIQUE: Multidetector CT imaging of the chest was performed following the standard protocol without IV contrast. RADIATION DOSE REDUCTION: This exam was performed according to the departmental dose-optimization program which includes automated exposure control, adjustment of the mA and/or kV according to patient size and/or use of iterative reconstruction technique. COMPARISON:  12/24/2022 FINDINGS: Cardiovascular: No significant coronary artery calcification. Stable cardiomegaly. Left subclavian dual lead pacemaker in place with leads within the right atrium and right ventricle toward the apex. No pericardial effusion. Central pulmonary arteries are of normal caliber. Thoracic aorta is unremarkable. Mediastinum/Nodes: No enlarged mediastinal or axillary lymph nodes. Thyroid gland, trachea, and esophagus demonstrate no significant findings. Small hiatal hernia. Lungs/Pleura: Mild bibasilar dependent atelectasis. Lungs are otherwise clear. No pneumothorax or pleural effusion. Upper Abdomen: No acute abnormality. Musculoskeletal: No chest wall mass or suspicious bone lesions identified. IMPRESSION: 1. No acute intrathoracic pathology identified. No definite radiographic explanation for the patient's reported symptoms. 2. Stable cardiomegaly. 3. Small hiatal hernia. Electronically Signed   By: Helyn Numbers M.D.   On: 02/27/2023 23:10   DG Chest 2 View  Result Date: 02/27/2023 CLINICAL DATA:  Acute on chronic dyspnea EXAM:  CHEST - 2 VIEW COMPARISON:  Chest radiograph dated 07/23/2022 FINDINGS: Lines/tubes: Left chest wall ICD leads project over the right atrium and ventricle. Lungs: Left basilar patchy opacity. Pleura: No pneumothorax or pleural effusion. Heart/mediastinum: Similar enlarged cardiomediastinal silhouette. Bones: No acute osseous abnormality. Partially imaged left humeral hardware appears intact. IMPRESSION: 1. Left basilar patchy opacity, which may represent atelectasis or infection. 2. Similar cardiomegaly. Electronically Signed   By: Agustin Cree M.D.   On: 02/27/2023 20:37    Assessment and Plan:   Acute on chronic heart failure exacerbation Shortness of breath is likely multifactorial but definitely driven by ongoing volume overload.  Given his advanced CKD we will continue with IV diuretics at elevated doses.  Recommend at this time giving IV Lasix 120 mg and monitoring his urine output.  Target urine output of net -1 to 2 L for now.  Given his CKD, COPD, and difficult volume assessment he may need a right heart catheterization at some point to understand his hemodynamics.  We will continue to follow.  Would repeat an echo.  Recommendations: -Continue IV Lasix for target net -1 to 2 L -Repeat echo        For questions or updates, please  contact [facility] Please consult [email or web address] for contact info under     Signed, Joellen Jersey, MD  02/28/2023 6:50 AM

## 2023-02-28 NOTE — Progress Notes (Signed)
Progress Note   Patient: [name]:[id number] DOB: 13-Aug-1965 DOA: 02/27/2023     0 DOS: the patient was seen and examined on 02/28/2023   Brief hospital course: [name] was admitted to the hospital with the working diagnosis of heart failure exacerbation.   57 yo male with the past medical history of obesity class 3, T2DM, hypertension, CKD, COPD, and heart failure who presented with dyspnea. Reported worsening dyspnea for several weeks, along with abdominal wall and lower extremity edema. Recent outpatient cardiology evaluation (heart failure clinic) and was started on torsemide 80 mg po daily.  On his initial physical examination his blood pressure is 114/67, HR 68, RR 24 and 02 saturation 97%, lungs with no wheezing or rhonchi, heart with S1 and S2 present and regular, abdomen with no distention and positive lower extremity edema.   Chest radiograph with cardiomegaly with bilateral hilar vascular congestion, pacemaker defibrillator in place with one right atrial and one ventricular lead in place.   EKG 67 bpm, right axis deviation, normal intervals, sinus rhythm with poor R R wave progression, with no significant ST segment or T wave changes.       Assessment and Plan: * Acute on chronic systolic CHF (congestive heart failure) (HCC) Echocardiogram with reduced LV systolic function EF 20 to 25%, global hypokinesis, mild dilated internal cavity, moderate LVH, RV systolic function preserved, RVSP 19,3 mmHg. No significant valvular disease.   Urine output is 1,800 ml Systolic blood pressure is 111 to 113 mmHg.   Plan to continue diuresis with furosemide 80 mg q 12hrs Metoprolol, entresto, spironolactone.   History of ventricular tachycardia. Sp AICD Continue with amiodarone.   Asthma-COPD overlap syndrome Patient with no clinical signs of exacerbation.  Plan to continue bronchodilator therapy.   Stage 3b chronic kidney disease (HCC) Hypokalemia.   Renal function  with serum cr 2,73 with K at 3,3 serum bicarbonate 27, Na 137. Mg 2.3   Plan to continue K correction with Kcl, total for today 60 meq.  Continue diuresis and follow up renal function in am.   Type 2 diabetes mellitus with hyperlipidemia (HCC) Continue insulin sliding scale for glucose cover and monitoring.  Continue basal insulin at 20 units, reduced dose than his home regimen to avoid hypoglycemia.   Continue with statin therapy.   Class 3 obesity (HCC) Calculated BMI 40,3         Subjective: patient is feeling better, dyspnea has improved, continue to have edema.   Physical Exam: Vitals:   02/28/23 0403 02/28/23 0716 02/28/23 0859 02/28/23 1331  BP: 123/62 (!) 105/48 113/60 111/76  Pulse: (!) 52 64 64 72  Resp: 18 16  18   Temp: 97.8 F (36.6 C) 97.6 F (36.4 C)  98.7 F (37.1 C)  TempSrc: Oral Oral  Oral  SpO2: 96% 97%  97%  Weight:      Height:       Neurology awake and alert ENT with mild pallor Cardiovascular with S1 and S2 present and regular with no gallops, rubs or murmurs Positive lower extremity edema more right than left Respiratory with rales bilaterally with no wheezing, no rhonchi Abdomen with no distention  Data Reviewed:    Family Communication: no family at the bedside   Disposition: Status is: Inpatient Remains inpatient appropriate because: IV diuresis   Planned Discharge Destination: Home    Author: Coralie Keens, MD 02/28/2023 1:45 PM  For on call review [email or web address].

## 2023-02-28 NOTE — Hospital Course (Signed)
[name] was admitted to the hospital with the working diagnosis of heart failure exacerbation.   57 yo male with the past medical history of obesity class 3, T2DM, hypertension, CKD, COPD, and heart failure who presented with dyspnea. Reported worsening dyspnea for several weeks, along with abdominal wall and lower extremity edema. Recent outpatient cardiology evaluation (heart failure clinic) and was started on torsemide 80 mg po daily.  On his initial physical examination his blood pressure is 114/67, HR 68, RR 24 and 02 saturation 97%, lungs with no wheezing or rhonchi, heart with S1 and S2 present and regular, abdomen with no distention and positive lower extremity edema.   Chest radiograph with cardiomegaly with bilateral hilar vascular congestion, pacemaker defibrillator in place with one right atrial and one ventricular lead in place.   EKG 67 bpm, right axis deviation, normal intervals, sinus rhythm with poor R R wave progression, with no significant ST segment or T wave changes.   09/22 patient has been placed on IV furosemide with good response. Continue volume overloaded.  09/23 continue to improve volume status, transition to oral loop diuretic tomorrow.

## 2023-02-28 NOTE — Significant Event (Signed)
Pt is currently very sleepy did not sleep last night, Snores loud and states that he missed his sleep study and needs to to be rescheduled. 02 sat sat currently 95% on Ra with head elevated > 20 degrees with 2 pillow 1 folded. IV lasix given.

## 2023-02-28 NOTE — Assessment & Plan Note (Signed)
Calculated BMI 40,3   Patient likely has OSA, and may be contributing to his symptoms, needs to follow up with outpatient sleep study to get Cpap.

## 2023-02-28 NOTE — Progress Notes (Signed)
Agree with current plan from cardiology consult note earlier this morning.

## 2023-02-28 NOTE — Assessment & Plan Note (Signed)
Continue insulin sliding scale for glucose cover and monitoring.  Continue basal insulin at 20 units, reduced dose than his home regimen to avoid hypoglycemia.  Fasting glucose today 171 mg/dl.   Continue with statin therapy.

## 2023-03-01 DIAGNOSIS — E1169 Type 2 diabetes mellitus with other specified complication: Secondary | ICD-10-CM

## 2023-03-01 DIAGNOSIS — J4489 Other specified chronic obstructive pulmonary disease: Secondary | ICD-10-CM | POA: Diagnosis not present

## 2023-03-01 DIAGNOSIS — I5023 Acute on chronic systolic (congestive) heart failure: Secondary | ICD-10-CM | POA: Diagnosis not present

## 2023-03-01 DIAGNOSIS — N1832 Chronic kidney disease, stage 3b: Secondary | ICD-10-CM | POA: Diagnosis not present

## 2023-03-01 DIAGNOSIS — E785 Hyperlipidemia, unspecified: Secondary | ICD-10-CM | POA: Diagnosis not present

## 2023-03-01 LAB — GLUCOSE, CAPILLARY
Glucose-Capillary: 158 mg/dL — ABNORMAL HIGH (ref 70–99)
Glucose-Capillary: 246 mg/dL — ABNORMAL HIGH (ref 70–99)
Glucose-Capillary: 209 mg/dL — ABNORMAL HIGH (ref 70–99)
Glucose-Capillary: 243 mg/dL — ABNORMAL HIGH (ref 70–99)

## 2023-03-01 LAB — BASIC METABOLIC PANEL
Anion gap: 12 (ref 5–15)
BUN: 31 mg/dL — ABNORMAL HIGH (ref 6–20)
CO2: 28 mmol/L (ref 22–32)
Calcium: 9.5 mg/dL (ref 8.9–10.3)
Chloride: 99 mmol/L (ref 98–111)
Creatinine, Ser: 2.46 mg/dL — ABNORMAL HIGH (ref 0.61–1.24)
GFR, Estimated: 30 mL/min — ABNORMAL LOW (ref >60)
Glucose, Bld: 171 mg/dL — ABNORMAL HIGH (ref 70–99)
Potassium: 3.9 mmol/L (ref 3.5–5.1)
Sodium: 139 mmol/L (ref 135–145)

## 2023-03-01 LAB — MAGNESIUM: Magnesium: 2.5 mg/dL — ABNORMAL HIGH (ref 1.7–2.4)

## 2023-03-01 MED ORDER — POTASSIUM CHLORIDE CRYS ER 20 MEQ PO TBCR
40.0000 meq | EXTENDED_RELEASE_TABLET | Freq: Once | ORAL | Status: AC
  Administered 2023-03-01: 40 meq via ORAL
  Filled 2023-03-01: qty 2

## 2023-03-01 NOTE — Plan of Care (Signed)
Problem: Nutrition: Goal: Adequate nutrition will be maintained Outcome: Completed/Met   Problem: Coping: Goal: Level of anxiety will decrease Outcome: Completed/Met   Problem: Elimination: Goal: Will not experience complications related to urinary retention Outcome: Completed/Met   Problem: Pain Managment: Goal: General experience of comfort will improve Outcome: Completed/Met   Problem: Safety: Goal: Ability to remain free from injury will improve Outcome: Completed/Met   Problem: Skin Integrity: Goal: Risk for impaired skin integrity will decrease Outcome: Completed/Met

## 2023-03-01 NOTE — Progress Notes (Signed)
Progress Note   Patient: [name] NAT:[id number] DOB: 1966-05-12 DOA: 02/27/2023     1 DOS: the patient was seen and examined on 03/01/2023   Brief hospital course: [name] was admitted to the hospital with the working diagnosis of heart failure exacerbation.   57 yo male with the past medical history of obesity class 3, T2DM, hypertension, CKD, COPD, and heart failure who presented with dyspnea. Reported worsening dyspnea for several weeks, along with abdominal wall and lower extremity edema. Recent outpatient cardiology evaluation (heart failure clinic) and was started on torsemide 80 mg po daily.  On his initial physical examination his blood pressure is 114/67, HR 68, RR 24 and 02 saturation 97%, lungs with no wheezing or rhonchi, heart with S1 and S2 present and regular, abdomen with no distention and positive lower extremity edema.   Chest radiograph with cardiomegaly with bilateral hilar vascular congestion, pacemaker defibrillator in place with one right atrial and one ventricular lead in place.   EKG 67 bpm, right axis deviation, normal intervals, sinus rhythm with poor R R wave progression, with no significant ST segment or T wave changes.   09/22 patient has been placed on IV furosemide with good response. Continue volume overloaded.   Assessment and Plan: * Acute on chronic systolic CHF (congestive heart failure) (HCC) Echocardiogram with reduced LV systolic function EF 20 to 25%, global hypokinesis, mild dilated internal cavity, moderate LVH, RV systolic function preserved, RVSP 19,3 mmHg. No significant valvular disease.   Urine output is 3,100 ml Systolic blood pressure is 109 to 111 mmHg.   Plan to continue diuresis with furosemide 80 mg q 12hrs Metoprolol, entresto, spironolactone.   History of ventricular tachycardia. Sp AICD Continue with amiodarone.   Asthma-COPD overlap syndrome Patient with no clinical signs of exacerbation.  Plan to continue  bronchodilator therapy.   Stage 3b chronic kidney disease (HCC) Hypokalemia.   Patient with improvement in volume status, renal function today with serum cr at 2,46 with K at 3,9 and serum bicarbonate at 28. Na 139  and Mg 2,5   Add 40 kcl today to prevent hypokalemia.  Follow up renal function in am. Continue diuresis with IV furosemide, po spironolactone and SGLT 2 inh.   Type 2 diabetes mellitus with hyperlipidemia (HCC) Continue insulin sliding scale for glucose cover and monitoring.  Continue basal insulin at 20 units, reduced dose than his home regimen to avoid hypoglycemia.  Fasting glucose today 171 mg/dl.   Continue with statin therapy.   Class 3 obesity (HCC) Calculated BMI 40,3         Subjective: Patient with improvement in his symptoms but not back to baseline, no chest pain, he has been ambulating in the hallway   Physical Exam: Vitals:   03/01/23 0600 03/01/23 0842 03/01/23 0849 03/01/23 0900  BP: 117/66 (!) 111/58    Pulse: 64  66 72  Resp:   16   Temp: 97.9 F (36.6 C) 97.8 F (36.6 C)    TempSrc: Oral   Oral  SpO2: 94%  97%   Weight: 133.8 kg     Height:       Neurology awake and alert ENT with no pallor Cardiovascular with S1 and S2 present and regular with no gallops, rubs or murmurs Respiratory with rales at bases with no wheezing or rhonchi Abdomen with no distention  Trace lower extremity edema, non pitting  Data Reviewed:    Family Communication: no family at the bedside   Disposition:  Status is: Inpatient Remains inpatient appropriate because: IV diuresis   Planned Discharge Destination: Home      Author: Coralie Keens, MD 03/01/2023 10:29 AM  For on call review [email or web address].

## 2023-03-01 NOTE — Progress Notes (Addendum)
   Patient Name: [name] Date of Encounter: 03/01/2023 [facility] Cardiologist: Olga Millers, MD Dr. Gasper Lloyd.   Interval Summary  .    Continuing to diurese.  Feels better.  Weight down to 295 from 306.  He is a bit anxious about utilizing inhaled nebulizers.  He is worried about the cardiac arrest episodes.  Reassured that he is monitored, ICD in place  Vital Signs .    Vitals:   02/28/23 1937 02/28/23 2120 02/28/23 2333 03/01/23 0600  BP: 118/70 115/77 109/71 117/66  Pulse: 64 64 71 64  Resp: 17     Temp: 97.9 F (36.6 C)   97.9 F (36.6 C)  TempSrc: Oral  Oral Oral  SpO2: 96%  98% 94%  Weight:    133.8 kg  Height:        Intake/Output Summary (Last 24 hours) at 03/01/2023 0838 Last data filed at 03/01/2023 0600 Gross per 24 hour  Intake 1080 ml  Output 3101 ml  Net -2021 ml      03/01/2023    6:00 AM 02/27/2023    6:27 PM 02/23/2023    3:29 PM  Last 3 Weights  Weight (lbs) 295 lb 306 lb 306 lb 9.6 oz  Weight (kg) 133.811 kg 138.801 kg 139.073 kg      Telemetry/ECG    Sinus- Personally Reviewed  Physical Exam .   GEN: No acute distress.   Neck: No JVD Cardiac: RRR, no murmurs, rubs, or gallops.  Respiratory: Clear to auscultation bilaterally. GI: Soft, nontender, non-distended  MS: Moderate right greater than left lower extremity edema  Assessment & Plan .     57 year old male with nonischemic cardiomyopathy EF 20% here for heart failure exacerbation  Acute systolic heart failure - EF 20%.  Creatinine increased from 2.28-2.73 back down to 2.46.  Potassium 3.9 this morning.    Hemoglobin 13.1 BNP 24.  Still volume overloaded.  3.1 L out yesterday continuing with IV Lasix 80 mg twice daily. -Toprol 50 -Entresto 24/26 -Spironolactone 12.5  Chronic kidney disease stage IIIb - Continue to monitor creatinine.  Noted above.  Type 2 diabetes with hyperlipidemia and chronic kidney disease - On atorvastatin 10 mg a day, nonischemic  cardiomyopathy  History of ventricular tachycardia with ICD - On amiodarone.  Continue.   For questions or updates, please contact [facility] Please consult [email or web address] for contact info under        Signed, Donato Schultz, MD

## 2023-03-02 DIAGNOSIS — E1169 Type 2 diabetes mellitus with other specified complication: Secondary | ICD-10-CM | POA: Diagnosis not present

## 2023-03-02 DIAGNOSIS — N1832 Chronic kidney disease, stage 3b: Secondary | ICD-10-CM | POA: Diagnosis not present

## 2023-03-02 DIAGNOSIS — I5023 Acute on chronic systolic (congestive) heart failure: Secondary | ICD-10-CM | POA: Diagnosis not present

## 2023-03-02 DIAGNOSIS — J4489 Other specified chronic obstructive pulmonary disease: Secondary | ICD-10-CM | POA: Diagnosis not present

## 2023-03-02 LAB — GLUCOSE, CAPILLARY
Glucose-Capillary: 189 mg/dL — ABNORMAL HIGH (ref 70–99)
Glucose-Capillary: 340 mg/dL — ABNORMAL HIGH (ref 70–99)
Glucose-Capillary: 181 mg/dL — ABNORMAL HIGH (ref 70–99)
Glucose-Capillary: 144 mg/dL — ABNORMAL HIGH (ref 70–99)

## 2023-03-02 LAB — BASIC METABOLIC PANEL
Anion gap: 13 (ref 5–15)
BUN: 39 mg/dL — ABNORMAL HIGH (ref 6–20)
CO2: 24 mmol/L (ref 22–32)
Calcium: 9.3 mg/dL (ref 8.9–10.3)
Chloride: 98 mmol/L (ref 98–111)
Creatinine, Ser: 2.58 mg/dL — ABNORMAL HIGH (ref 0.61–1.24)
GFR, Estimated: 28 mL/min — ABNORMAL LOW (ref >60)
Glucose, Bld: 200 mg/dL — ABNORMAL HIGH (ref 70–99)
Potassium: 3.9 mmol/L (ref 3.5–5.1)
Sodium: 135 mmol/L (ref 135–145)

## 2023-03-02 MED ORDER — FAMOTIDINE 20 MG PO TABS
20.0000 mg | ORAL_TABLET | Freq: Two times a day (BID) | ORAL | Status: DC
  Administered 2023-03-02 – 2023-03-03 (×2): 20 mg via ORAL
  Filled 2023-03-02 (×2): qty 1

## 2023-03-02 MED ORDER — INSULIN ASPART 100 UNIT/ML IJ SOLN
4.0000 [IU] | Freq: Three times a day (TID) | INTRAMUSCULAR | Status: DC
  Administered 2023-03-02 – 2023-03-03 (×2): 4 [IU] via SUBCUTANEOUS

## 2023-03-02 MED ORDER — LORATADINE 10 MG PO TABS
10.0000 mg | ORAL_TABLET | Freq: Every day | ORAL | Status: DC
  Administered 2023-03-02 – 2023-03-03 (×2): 10 mg via ORAL
  Filled 2023-03-02 (×2): qty 1

## 2023-03-02 MED ORDER — DIPHENHYDRAMINE HCL 25 MG PO CAPS
25.0000 mg | ORAL_CAPSULE | Freq: Four times a day (QID) | ORAL | Status: DC | PRN
  Administered 2023-03-02: 25 mg via ORAL
  Filled 2023-03-02: qty 1

## 2023-03-02 MED ORDER — DAPAGLIFLOZIN PROPANEDIOL 10 MG PO TABS
10.0000 mg | ORAL_TABLET | Freq: Every day | ORAL | Status: DC
  Administered 2023-03-02 – 2023-03-03 (×2): 10 mg via ORAL
  Filled 2023-03-02 (×2): qty 1

## 2023-03-02 MED ORDER — TORSEMIDE 20 MG PO TABS
80.0000 mg | ORAL_TABLET | Freq: Every day | ORAL | Status: DC
  Administered 2023-03-03: 80 mg via ORAL
  Filled 2023-03-02: qty 4

## 2023-03-02 NOTE — Inpatient Diabetes Management (Signed)
Inpatient Diabetes Program Recommendations  AACE/ADA: New Consensus Statement on Inpatient Glycemic Control (2015)  Target Ranges:  Prepandial:   less than 140 mg/dL      Peak postprandial:   less than 180 mg/dL (1-2 hours)      Critically ill patients:  140 - 180 mg/dL   Lab Results  Component Value Date   GLUCAP 189 (H) 03/02/2023   HGBA1C 9.1 (A) 02/13/2023    Latest Reference Range & Units 03/01/23 06:17 03/01/23 10:41 03/01/23 15:44 03/01/23 20:49 03/02/23 06:13  Glucose-Capillary 70 - 99 mg/dL 606 (H) 301 (H) 601 (H) 243 (H) 189 (H)  (H): Data is abnormally high  Diabetes history: DM2 Outpatient Diabetes medications: Lantus 36 units hs, Novolog 24 units tid meal coverage, Farxiga 10 mg qd Current orders for Inpatient glycemic control: Semglee 20 units daily, Novolog 0-9 units tid, 0-5 units hs correction  Inpatient Diabetes Program Recommendations:   Please consider: -Add carb mod to current diet order if appropriate -Add Novolog 4 units tid ac meal coverage if eats @ least 50%  Thank you, Darel Hong E. Delvonte Berenson, RN, MSN, CDE  Diabetes Coordinator Inpatient Glycemic Control Team Team Pager [phone] (8am-5pm) 03/02/2023 10:03 AM

## 2023-03-02 NOTE — Plan of Care (Signed)
Problem: Activity: Goal: Ability to return to baseline activity level will improve Outcome: Progressing   Problem: Cardiovascular: Goal: Ability to achieve and maintain adequate cardiovascular perfusion will improve Outcome: Progressing   Problem: Cardiovascular: Goal: Vascular access site(s) Level 0-1 will be maintained Outcome: Progressing

## 2023-03-02 NOTE — TOC Initial Note (Signed)
Transition of Care [facility]) - Initial/Assessment Note    Patient Details  Name: [name] MRN: [id number] Date of Birth: 1966/04/22  Transition of Care [facility]) CM/SW Contact:    Leone Haven, RN Phone Number: 03/02/2023, 10:23 AM  Clinical Narrative:                 From home with brother, has PCP  (CHW clinic) and insurance on file, states has no HH services in place at this time or DME at home.  States he drove himself to the hospital  and he will drive home at dc and brothere is support system, states gets medications from [facility] on  [address]  Pta self ambulatory.   Expected Discharge Plan: Home/Self Care Barriers to Discharge: Continued Medical Work up   Patient Goals and CMS Choice Patient states their goals for this hospitalization and ongoing recovery are:: return home   Choice offered to / list presented to : NA      Expected Discharge Plan and Services In-house Referral: NA Discharge Planning Services: CM Consult Post Acute Care Choice: NA Living arrangements for the past 2 months: Single Family Home                           HH Arranged: NA          Prior Living Arrangements/Services Living arrangements for the past 2 months: Single Family Home Lives with:: Siblings (brother) Patient language and need for interpreter reviewed:: Yes Do you feel safe going back to the place where you live?: Yes      Need for Family Participation in Patient Care: Yes (Comment) Care giver support system in place?: Yes (comment)   Criminal Activity/Legal Involvement Pertinent to Current Situation/Hospitalization: No - Comment as needed  Activities of Daily Living Home Assistive Devices/Equipment: Eyeglasses, Walker (specify type) (walker not used per patient) ADL Screening (condition at time of admission) Patient's cognitive ability adequate to safely complete daily activities?: Yes Is the patient deaf or have difficulty hearing?: No Does the patient have  difficulty seeing, even when wearing glasses/contacts?: No Does the patient have difficulty concentrating, remembering, or making decisions?: No Patient able to express need for assistance with ADLs?: Yes Does the patient have difficulty dressing or bathing?: No Independently performs ADLs?: Yes (appropriate for developmental age) Does the patient have difficulty walking or climbing stairs?: Yes Weakness of Legs: None Weakness of Arms/Hands: None  Permission Sought/Granted Permission sought to share information with : Case Manager Permission granted to share information with : Yes, Verbal Permission Granted              Emotional Assessment   Attitude/Demeanor/Rapport: Engaged Affect (typically observed): Appropriate Orientation: : Oriented to Self, Oriented to Place, Oriented to  Time, Oriented to Situation Alcohol / Substance Use: Alcohol Use, Illicit Drugs Psych Involvement: No (comment)  Admission diagnosis:  SOB (shortness of breath) [R06.02] Acute on chronic congestive heart failure, unspecified heart failure type (HCC) [I50.9] Heart failure (HCC) [I50.9] Patient Active Problem List   Diagnosis Date Noted   Heart failure (HCC) 02/28/2023   Class 3 obesity (HCC) 02/28/2023   SOB (shortness of breath) 02/27/2023   Loud snoring 10/28/2022   Asthma-COPD overlap syndrome 10/09/2022   Hyperlipidemia associated with type 2 diabetes mellitus (HCC) 09/18/2022   PTSD (post-traumatic stress disorder) 07/10/2022   Panic attack 06/17/2022   Passive suicidal ideations 06/17/2022   History of ventricular tachycardia 06/14/2022   Essential  hypertension 06/14/2022   Stage 3b chronic kidney disease (HCC) 05/15/2022   Acute gout due to renal impairment involving left ankle 05/07/2022   Acute gout due to renal impairment involving right knee 05/07/2022   Hyponatremia 05/07/2022   Diarrhea 05/06/2022   Hyperglycemia 05/06/2022   Cellulitis of buttock 05/06/2022   Acute on chronic  systolic CHF (congestive heart failure) (HCC) 05/06/2022   Acute renal failure superimposed on stage 3b chronic kidney disease (HCC) 05/06/2022   Hypokalemia 05/05/2022   Rectal abscess 05/05/2022   Left buttock abscess 05/05/2022   Cardiac arrest (HCC) 05/03/2022   AKI (acute kidney injury) (HCC) 05/03/2022   V-tach (HCC) 05/03/2022   CKD (chronic kidney disease) stage 2, GFR 60-89 ml/min 10/24/2019   Acute non-recurrent frontal sinusitis 06/27/2019   COVID-19 virus infection 06/27/2019   Glaucoma suspect 12/22/2018   Seasonal and perennial allergic rhinoconjunctivitis 10/25/2018   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   Urticaria 05/03/2018   S/P inguinal hernia repair 03/01/2018   Unilateral inguinal hernia without obstruction or gangrene 07/24/2017   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   Hypersomnia 02/28/2016   Abnormal PFT 02/24/2016   Dyspnea 01/28/2016   Diabetic polyneuropathy associated with type 2 diabetes mellitus (HCC) 12/18/2015   Lumbar back pain 12/18/2015   Type 2 diabetes mellitus with hyperlipidemia (HCC) 09/17/2015   Left median nerve neuropathy 04/16/2015   Lesion of right median nerve at forearm 04/16/2015   ICD (implantable cardioverter-defibrillator) in place 03/28/2014   Chronic systolic congestive heart failure (HCC) 03/28/2014   Nonischemic cardiomyopathy (HCC) 11/09/2013   Syncope 10/20/2013   Chest pain 07/28/2013   RENAL CALCULUS 05/13/2009   Obesity 05/08/2009   Former heavy cigarette smoker (20-39 per day) 05/08/2009   HOARSENESS 05/08/2009   GAD (generalized anxiety disorder) 02/28/2008   ASTHMATIC BRONCHITIS, ACUTE 02/28/2008   GERD 02/28/2008   IRRITABLE BOWEL SYNDROME 02/28/2008   PCP:  Marcine Matar, MD Pharmacy:   [id number] - [place], [place] - [address] [place] [address] Phone: [phone] Fax: [phone]  [facility]  [id number] - [place], [place] - [address] AT [facility] [address] & [address] [place] [address] Phone: [phone] Fax: [phone]     Social Determinants of Health (SDOH) Social History: SDOH Screenings   Food Insecurity: No Food Insecurity (02/28/2023)  Housing: Low Risk  (02/28/2023)  Transportation Needs: No Transportation Needs (02/28/2023)  Utilities: At Risk (02/28/2023)  Alcohol Screen: Low Risk  (10/13/2018)  Depression (PHQ2-9): Medium Risk (02/13/2023)  Financial Resource Strain: High Risk (05/08/2022)  Social Connections: Moderately Integrated (03/12/2021)  Tobacco Use: Medium Risk (02/27/2023)   SDOH Interventions:     Readmission Risk Interventions     No data to display

## 2023-03-02 NOTE — Progress Notes (Signed)
Heart Failure Navigator Progress Note  Assessed for Heart & Vascular TOC clinic readiness.  Patient does not meet criteria due to Advanced Heart Failure Team patient of Dr. Gasper Lloyd.   Navigator will sign off at this time.   Rhae Hammock, BSN, Scientist, clinical (histocompatibility and immunogenetics) Only

## 2023-03-02 NOTE — Evaluation (Addendum)
Occupational Therapy Evaluation Patient Details Name: [name] MRN: [id number] DOB: 10-28-1965 Today's Date: 03/02/2023   History of Present Illness Pt is a 57 y/o M admitted on 02/27/23 after presenting with c/o dyspnea & edema. Pt is being treated for acute on chronic systolic CHF. PMH: obesity, DM2, HTN, CKD, COPD, heart failure, pacemaker defibrillator   Clinical Impression   PTA, pt living with his brother, overall was ind with ADLs/mobility, was not working. Currently is at baseline for ADLs, performing simulated tub transfer, toilet transfer, and UB dressing without assist. Pt on 2L O2 upon arrival, ambulated short distance to door with SpO2 in 90's on RA. Handoff to PT at end of session. Pt presenting with impairments listed below, however has no acute OT needs at this time, will s/o. Please reconsult if there is a change in pt status.       If plan is discharge home, recommend the following: A little help with bathing/dressing/bathroom;Assist for transportation    Functional Status Assessment  Patient has had a recent decline in their functional status and demonstrates the ability to make significant improvements in function in a reasonable and predictable amount of time.  Equipment Recommendations  None recommended by OT    Recommendations for Other Services       Precautions / Restrictions Precautions Precautions: None Restrictions Weight Bearing Restrictions: No      Mobility Bed Mobility Overal bed mobility: Independent                  Transfers Overall transfer level: Independent                        Balance Overall balance assessment: Independent                                         ADL either performed or assessed with clinical judgement   ADL Overall ADL's : At baseline                                       General ADL Comments: pt completing UB ADL, simulated toilet transfer and simulated  tub transfer without assist.     Vision Baseline Vision/History: 1 Wears glasses Vision Assessment?: No apparent visual deficits     Perception Perception: Not tested       Praxis Praxis: Not tested       Pertinent Vitals/Pain Pain Assessment Pain Assessment: Faces Pain Score: 6  Faces Pain Scale: Hurts even more Pain Location: back pain (has been ongoing x 2 months) Pain Descriptors / Indicators: Discomfort Pain Intervention(s): Limited activity within patient's tolerance, Monitored during session, Repositioned     Extremity/Trunk Assessment Upper Extremity Assessment Upper Extremity Assessment: Overall WFL for tasks assessed   Lower Extremity Assessment Lower Extremity Assessment: Defer to PT evaluation   Cervical / Trunk Assessment Cervical / Trunk Assessment: Normal   Communication Communication Communication: No apparent difficulties   Cognition Arousal: Alert Behavior During Therapy: WFL for tasks assessed/performed Overall Cognitive Status: Within Functional Limits for tasks assessed                                       General Comments  VSS on RA for short distance mobility in room    Exercises     Shoulder Instructions      Home Living Family/patient expects to be discharged to:: Private residence Living Arrangements: Other relatives (brother) Available Help at Discharge: Family;Available PRN/intermittently Type of Home: House Home Access: Stairs to enter [employer] of Steps: 4   Home Layout: One level     Bathroom Shower/Tub: Chief Strategy Officer: Handicapped height     Home Equipment: Rollator (4 wheels)   Additional Comments: does not wear O2 at home      Prior Functioning/Environment Prior Level of Function : Independent/Modified Independent             Mobility Comments: use of rollator PRN ADLs Comments: ind        OT Problem List: Decreased activity tolerance      OT  Treatment/Interventions:      OT Goals(Current goals can be found in the care plan section) Acute Rehab OT Goals Patient Stated Goal: none stated OT Goal Formulation: With patient Time For Goal Achievement: 03/16/23 Potential to Achieve Goals: Good  OT Frequency:      Co-evaluation              AM-PAC OT "6 Clicks" Daily Activity     Outcome Measure Help from another person eating meals?: None Help from another person taking care of personal grooming?: None Help from another person toileting, which includes using toliet, bedpan, or urinal?: None Help from another person bathing (including washing, rinsing, drying)?: A Little Help from another person to put on and taking off regular upper body clothing?: None Help from another person to put on and taking off regular lower body clothing?: None 6 Click Score: 23   End of Session Nurse Communication: Mobility status  Activity Tolerance: Patient tolerated treatment well Patient left: Other (comment) (handoff to PT in doorway)  OT Visit Diagnosis: Muscle weakness (generalized) (M62.81)                Time: 7829-5621 OT Time Calculation (min): 10 min Charges:  OT General Charges $OT Visit: 1 Visit OT Evaluation $OT Eval Low Complexity: 1 Low  [name], OTD, OTR/L SecureChat Preferred Acute Rehab [phone]   [name] 03/02/2023, 4:06 PM

## 2023-03-02 NOTE — Progress Notes (Addendum)
Progress Note   Patient: [name] WUJ:[id number] DOB: April 16, 1966 DOA: 02/27/2023     2 DOS: the patient was seen and examined on 03/02/2023   Brief hospital course: Mr. Perdew was admitted to the hospital with the working diagnosis of heart failure exacerbation.   57 yo male with the past medical history of obesity class 3, T2DM, hypertension, CKD, COPD, and heart failure who presented with dyspnea. Reported worsening dyspnea for several weeks, along with abdominal wall and lower extremity edema. Recent outpatient cardiology evaluation (heart failure clinic) and was started on torsemide 80 mg po daily.  On his initial physical examination his blood pressure is 114/67, HR 68, RR 24 and 02 saturation 97%, lungs with no wheezing or rhonchi, heart with S1 and S2 present and regular, abdomen with no distention and positive lower extremity edema.   Chest radiograph with cardiomegaly with bilateral hilar vascular congestion, pacemaker defibrillator in place with one right atrial and one ventricular lead in place.   EKG 67 bpm, right axis deviation, normal intervals, sinus rhythm with poor R R wave progression, with no significant ST segment or T wave changes.   09/22 patient has been placed on IV furosemide with good response. Continue volume overloaded.  09/23 continue to improve volume status, transition to oral loop diuretic tomorrow.   Assessment and Plan: * Acute on chronic systolic CHF (congestive heart failure) (HCC) Echocardiogram with reduced LV systolic function EF 20 to 25%, global hypokinesis, mild dilated internal cavity, moderate LVH, RV systolic function preserved, RVSP 19,3 mmHg. No significant valvular disease.   Urine output is 2,200 ml Systolic blood pressure is 121 to 122 mmHg.   Metoprolol, entresto, spironolactone.  Transition to po loop diuretic, and resume SGLT 2 inh.   History of ventricular tachycardia. Sp AICD Continue with amiodarone.   Asthma-COPD overlap  syndrome Patient with no clinical signs of exacerbation.  Plan to continue bronchodilator therapy.   Stage 3b chronic kidney disease (HCC) Hypokalemia.   Volume status has improved. Renal function with serum cr at 2,58 with K at 3,9 and serum bicarbonate at 24.  Na 135   He had one dose of IV furosemide this am 80 mg, will transition to po torsemide in am, he was recently changed from furosemide to torsemide 09/16 heart failure clinic. Continue with spironolactone and SGLT 2 inh.   Type 2 diabetes mellitus with hyperlipidemia (HCC) Continue insulin sliding scale for glucose cover and monitoring.  Continue basal insulin at 20 units, reduced dose than his home regimen to avoid hypoglycemia.  Add short acting insulin with meals 4 units.   Fasting glucose today 200 mg/dl. Consistent with uncontrolled type 2 diabetes with hyperglycemia.    Continue with statin therapy.   Class 3 obesity (HCC) Calculated BMI 40,3   Patient likely has OSA, and may be contributing to his symptoms, needs to follow up with outpatient sleep study to get Cpap.      Subjective: Patient with improvement in his symptoms but still not back to baseline, feeling week and tired.   Physical Exam: Vitals:   03/02/23 0411 03/02/23 0742 03/02/23 1016 03/02/23 1204  BP: 121/78 124/83  122/77  Pulse: 65 64  80  Resp: 18 18  14   Temp: 98.4 F (36.9 C) 98.2 F (36.8 C)  98.4 F (36.9 C)  TempSrc: Oral Oral  Oral  SpO2: 99% 96% 97% 96%  Weight: 133.5 kg     Height:       Neurology awake  and alert ENT with mild pallor Cardiovascular with S1 and S2 present and regular with no gallops, rubs or murmurs Respiratory with no rales or wheezing, no rhonchi Abdomen with no distention  No lower extremity edema  Data Reviewed:    Family Communication: no family at the bedside   Disposition: Status is: Inpatient Remains inpatient appropriate because: heart failure   Planned Discharge Destination:  Home      Author: Coralie Keens, MD 03/02/2023 12:37 PM  For on call review [email or web address].

## 2023-03-02 NOTE — Evaluation (Signed)
Physical Therapy Brief Evaluation and Discharge Note Patient Details Name: [name] MRN: [id number] DOB: 06-06-1966 Today's Date: 03/02/2023   History of Present Illness  Pt is a 57 y/o M admitted on 02/27/23 after presenting with c/o dyspnea & edema. Pt is being treated for acute on chronic systolic CHF. PMH: obesity, DM2, HTN, CKD, COPD, heart failure, pacemaker defibrillator  Clinical Impression  Pt seen for PT evaluation with pt agreeable to tx, pt received in handoff from OT. Pt reports he lives in a 1 level home with 4 steps with R rail to enter & is independent without AD at baseline. On this date, pt is able to ambulate around unit without AD with mod I with slow, steady gait speed. Pt negotiates 4 steps with 1 rail with CGA<>supervision. Pt endorses back pain but notes this has been ongoing x 2 months & is following up with his PCP re: this. PT encouraged pt to continue mobilizing to increase strength & endurance. Pt does not require formal acute PT services at this time. PT to complete current orders, please re-consult if new needs arise.       PT Assessment Patient does not need any further PT services  Assistance Needed at Discharge  PRN    Equipment Recommendations None recommended by PT  Recommendations for Other Services       Precautions/Restrictions Precautions Precautions: None Restrictions Weight Bearing Restrictions: No        Mobility  Bed Mobility       General bed mobility comments: not tested, pt received standing in room in handoff from OT  Transfers                   General transfer comment: not tested, pt received standing in room in handoff from OT    Ambulation/Gait Ambulation/Gait assistance: Modified independent (Device/Increase time) Gait Distance (Feet):  (>150 ft) Assistive device: None Gait Pattern/deviations: Decreased stride length Gait Speed: Below normal    Home Activity Instructions Home Activity Instructions: PT  educated pt on need to continue mobilizing to increase strength & endurance.  Stairs Stairs: Yes Stairs assistance: Contact guard assist, Supervision Stair Management: One rail Right (R ascending rail, R descending rail) Number of Stairs: 4 General stair comments: Pt self selects step-to progressing to step-over-step pattern.  Modified Rankin (Stroke Patients Only)        Balance Overall balance assessment: Mild deficits observed, not formally tested         Standing balance support: During functional activity, No upper extremity supported Standing balance-Leahy Scale: Good            Pertinent Vitals/Pain   Pain Assessment Pain Assessment: Faces Faces Pain Scale: Hurts even more Pain Location: back pain (has been ongoing x 2 months) Pain Descriptors / Indicators: Discomfort Pain Intervention(s): Monitored during session     Home Living Family/patient expects to be discharged to:: Private residence Living Arrangements: Other relatives (brother) Available Help at Discharge: Family Home Environment: Stairs to enter  Landscape architect of Steps: 4 with R rail Home Equipment: Rollator (4 wheels)   Additional Comments: does not wear O2 at home    Prior Function Level of Independence: Independent Comments: denies falls in the past 6 months    UE/LE Assessment   UE ROM/Strength/Tone/Coordination: WFL    LE ROM/Strength/Tone/Coordination: [facility]      Communication   Communication Communication: No apparent difficulties     Cognition Overall Cognitive Status: Appears within functional limits for tasks assessed/performed  General Comments General comments (skin integrity, edema, etc.): Pt on room air, lowest SPO2 91%. PT educated pt on pursed lip breathing.    Exercises     Assessment/Plan    PT Problem List         PT Visit Diagnosis      No Skilled PT Patient at baseline level of functioning   Co-evaluation                AMPAC 6  Clicks Help needed turning from your back to your side while in a flat bed without using bedrails?: None Help needed moving from lying on your back to sitting on the side of a flat bed without using bedrails?: None Help needed moving to and from a bed to a chair (including a wheelchair)?: None Help needed standing up from a chair using your arms (e.g., wheelchair or bedside chair)?: None Help needed to walk in hospital room?: None Help needed climbing 3-5 steps with a railing? : A Little 6 Click Score: 23      End of Session   Activity Tolerance: Patient tolerated treatment well Patient left: in bed;with call bell/phone within reach (seated EOB) Nurse Communication: Mobility status       Time: 4540-9811 PT Time Calculation (min) (ACUTE ONLY): 9 min  Charges:   PT Evaluation $PT Eval Low Complexity: 1 Low      Aleda Grana, PT, DPT 03/02/23, 2:52 PM   Sandi Mariscal  03/02/2023, 2:50 PM

## 2023-03-03 DIAGNOSIS — E1169 Type 2 diabetes mellitus with other specified complication: Secondary | ICD-10-CM | POA: Diagnosis not present

## 2023-03-03 DIAGNOSIS — I5023 Acute on chronic systolic (congestive) heart failure: Secondary | ICD-10-CM | POA: Diagnosis not present

## 2023-03-03 DIAGNOSIS — N1832 Chronic kidney disease, stage 3b: Secondary | ICD-10-CM | POA: Diagnosis not present

## 2023-03-03 DIAGNOSIS — J4489 Other specified chronic obstructive pulmonary disease: Secondary | ICD-10-CM | POA: Diagnosis not present

## 2023-03-03 LAB — GLUCOSE, CAPILLARY: Glucose-Capillary: 178 mg/dL — ABNORMAL HIGH (ref 70–99)

## 2023-03-03 LAB — BASIC METABOLIC PANEL
Anion gap: 11 (ref 5–15)
BUN: 46 mg/dL — ABNORMAL HIGH (ref 6–20)
CO2: 25 mmol/L (ref 22–32)
Calcium: 8.9 mg/dL (ref 8.9–10.3)
Chloride: 100 mmol/L (ref 98–111)
Creatinine, Ser: 2.87 mg/dL — ABNORMAL HIGH (ref 0.61–1.24)
GFR, Estimated: 25 mL/min — ABNORMAL LOW (ref >60)
Glucose, Bld: 193 mg/dL — ABNORMAL HIGH (ref 70–99)
Potassium: 4 mmol/L (ref 3.5–5.1)
Sodium: 136 mmol/L (ref 135–145)

## 2023-03-03 LAB — MAGNESIUM: Magnesium: 2.6 mg/dL — ABNORMAL HIGH (ref 1.7–2.4)

## 2023-03-03 NOTE — Discharge Summary (Addendum)
Physician Discharge Summary   Patient: [name] MRN: [id number] DOB: 02-16-1966  Admit date:     02/27/2023  Discharge date: 03/03/23  Discharge Physician: [name]   PCP: [name] Matar, MD   Recommendations at discharge:    Patient will continue taking torsemide 80 mg po daily, with instructions to increase to bid in case of volume overload, 2 to 3 lbs weight gain in 24 hrs or 5 lbs in 7 days.  Follow up renal function and electrolytes in 7 days. Follow up with [name] in 7 to 10 days. Follow up the heart failure clinic as scheduled.   Discharge Diagnoses: Principal Problem:   Acute on chronic systolic CHF (congestive heart failure) (HCC) Active Problems:   Asthma-COPD overlap syndrome   Stage 3b chronic kidney disease (HCC)   Type 2 diabetes mellitus with hyperlipidemia (HCC)   Class 3 obesity (HCC)  Resolved Problems:   * No resolved hospital problems. [name] Course: [name] was admitted to the hospital with the working diagnosis of heart failure exacerbation.   57 yo male with the past medical history of obesity class 3, T2DM, hypertension, CKD, COPD, and heart failure who presented with dyspnea. Reported worsening dyspnea for several weeks, along with abdominal wall and lower extremity edema. Recent outpatient cardiology evaluation (heart failure clinic) and was changed from furosemide 80 mg to torsemide 80 mg po daily.  On his initial physical examination his blood pressure is 114/67, HR 68, RR 24 and 02 saturation 97%, lungs with no wheezing or rhonchi, heart with S1 and S2 present and regular, abdomen with no distention and positive lower extremity edema.   Na 137, K 3,4 Cl 99, bicarbonate 22, glucose 281, bun 25 cr 2,55 High sensitive troponin 9 and 11 BNP 24.6  Wbc 5.7 hgb 13.3 plt 281   Chest radiograph with cardiomegaly with bilateral hilar vascular congestion, pacemaker defibrillator in place with one right atrial and one  ventricular lead in place.   EKG 67 bpm, right axis deviation, normal intervals, sinus rhythm with poor R R wave progression, with no significant ST segment or T wave changes.   09/22 patient has been placed on IV furosemide with good response. Continue volume overloaded.  09/23 continue to improve volume status, transition to oral loop diuretic tomorrow. 09/24 patient clinically euvolemic, plan to continue diuresis with torsemide and have close follow up as outpatient.    Assessment and Plan: * Acute on chronic systolic CHF (congestive heart failure) (HCC) Echocardiogram with reduced LV systolic function EF 20 to 25%, global hypokinesis, mild dilated internal cavity, moderate LVH, RV systolic function preserved, RVSP 19,3 mmHg. No significant valvular disease.   Patient was placed on furosemide IV for diuresis, negative fluid balance was achieved, -8,638 ml, with significant improvement in his symptoms.   Metoprolol, entresto, spironolactone, SGLT 2 inh Resume torsemide 80 mg po daily, with instructions to increase to bid 80 mg in case of volume overload, 2 to 3 lbs weight increase in 24 hrs or 5 lbs in 7 days.   History of ventricular tachycardia. Sp AICD Continue with amiodarone.   Asthma-COPD overlap syndrome Patient with no clinical signs of exacerbation.  Plan to continue bronchodilator therapy.   Stage 3b chronic kidney disease (HCC) AKI, Hypokalemia.   Patient responded well to diuresis with improvement in his volume status.  At the time of his discharge his renal function has a serum cr of 2,87 with K at 4,0 and serum  bicarbonate at 25. Na 136   Plan to continue diuresis with torsemide, spironolactone and SGLT 2 inh.   Type 2 diabetes mellitus with hyperlipidemia (HCC) Consistent with uncontrolled type 2 diabetes with hyperglycemia.    Patient was placed on insulin sliding scale for glucose cover and monitoring.  Received long acting basal insulin and pre meal short  acting insulin.    Fasting glucose today 193 mg/dl.  Continue with statin therapy.   Class 3 obesity (HCC) Calculated BMI 40,3   Patient likely has OSA, and may be contributing to his symptoms, needs to follow up with outpatient sleep study to get Cpap.   Consultants: cardiology  Procedures performed: none   Disposition: Home Diet recommendation:  Cardiac and Carb modified diet DISCHARGE MEDICATION: Allergies as of 03/03/2023       Reactions   Bee Venom Anaphylaxis   Trulicity [dulaglutide] Diarrhea   Extra dose of Trulicity caused cardiac arrest. Patient experience onset of diarrhea with using this medication - cleared after med stopped.        Medication List     STOP taking these medications    fluticasone 44 MCG/ACT inhaler Commonly known as: Flovent HFA       TAKE these medications    Accu-Chek Guide test strip Generic drug: glucose blood Use as instructed   FREESTYLE LITE test strip Generic drug: glucose blood Use as instructed.  Please make sure this is compatible with [name] 2 reader.   Accu-Chek Guide w/Device Kit UAD   Blood Glucose Monitoring Suppl Devi 1 each by Does not apply route in the morning, at noon, and at bedtime. May substitute to any manufacturer covered by patient's insurance.   accu-chek softclix lancets 1 each by Other route 3 (three) times daily.   Accu-Chek Softclix Lancets lancets Use as instructed   acetaminophen 500 MG tablet Commonly known as: TYLENOL Take 1,000 mg by mouth 2 (two) times daily.   albuterol 108 (90 Base) MCG/ACT inhaler Commonly known as: VENTOLIN HFA Inhale 2 puffs into the lungs every 6 (six) hours as needed for wheezing or shortness of breath.   amiodarone 200 MG tablet Commonly known as: PACERONE Take 1 tablet (200 mg total) by mouth daily.   aspirin 81 MG tablet Take 81 mg by mouth daily.   atorvastatin 10 MG tablet Commonly known as: LIPITOR Take 1 tablet (10 mg total) by mouth daily.    cetirizine 10 MG tablet Commonly known as: EQ Allergy Relief (Cetirizine) Take 1 tablet (10 mg total) by mouth daily.   cholecalciferol 25 MCG (1000 UNIT) tablet Commonly known as: VITAMIN D3 Take 1,000 Units by mouth daily.   clobetasol cream 0.05 % Commonly known as: TEMOVATE Apply 1 Application topically as needed.   clonazePAM 0.5 MG tablet Commonly known as: KlonoPIN Take 1 tablet (0.5 mg total) by mouth 2 (two) times daily as needed for anxiety. What changed: when to take this   dapagliflozin propanediol 10 MG Tabs tablet Commonly known as: Farxiga Take 1 tablet (10 mg total) by mouth daily before breakfast.   Entresto 24-26 MG Generic drug: sacubitril-valsartan Take 1 tablet by mouth 2 (two) times daily.   EPINEPHrine 0.3 mg/0.3 mL Soaj injection Commonly known as: EPI-PEN Inject 0.3 mg into the muscle as needed.   famotidine 20 MG tablet Commonly known as: PEPCID TAKE 1 TABLET BY MOUTH 1 TO 2 TIMES DAILY AS NEEDED   fluticasone 50 MCG/ACT nasal spray Commonly known as: FLONASE Use 1-2 sprays in each  nostril once a day as needed for nasal congestion.   FreeStyle Libre 2 Sensor Misc Use to check blood sugar TID. Change sensor once every 14 days. E11.65   gabapentin 300 MG capsule Commonly known as: NEURONTIN Take 1 capsule by mouth at bedtime   insulin aspart 100 UNIT/ML injection Commonly known as: novoLOG Inject 24 Units into the skin 3 (three) times daily with meals.   ipratropium-albuterol 0.5-2.5 (3) MG/3ML Soln Commonly known as: DUONEB Take 3 mLs by nebulization every 6 (six) hours as needed.   ketoconazole 2 % cream Commonly known as: NIZORAL Apply 1 Application topically daily.   Lantus SoloStar 100 UNIT/ML Solostar Pen Generic drug: insulin glargine Inject 36 Units into the skin daily.   methocarbamol 500 MG tablet Commonly known as: ROBAXIN Take 500 mg by mouth daily.   metoprolol succinate 50 MG 24 hr tablet Commonly known as:  TOPROL-XL Take 1 tablet (50 mg total) by mouth daily.   Pen Needles 31G X 8 MM Misc UAD   sertraline 25 MG tablet Commonly known as: Zoloft Take 1 tablet (25 mg total) by mouth daily.   spironolactone 25 MG tablet Commonly known as: ALDACTONE Take 0.5 tablets (12.5 mg total) by mouth daily.   tamsulosin 0.4 MG Caps capsule Commonly known as: FLOMAX Take 2 capsules (0.8 mg total) by mouth daily.   torsemide 20 MG tablet Commonly known as: DEMADEX Take 4 tablets (80 mg total) by mouth daily.   TRELEGY ELLIPTA IN Inhale 1 puff into the lungs daily.        Discharge Exam: Filed Weights   03/01/23 0600 03/02/23 0411 03/03/23 0434  Weight: 133.8 kg 133.5 kg 134.2 kg   BP 109/77 (BP Location: Left Arm)   Pulse 64   Temp 98.5 F (36.9 C) (Oral)   Resp (!) 22   Ht 6\' 1"  (1.854 m)   Wt 134.2 kg   SpO2 93%   BMI 39.03 kg/m   Patient is feeling better, no chest pain, no dyspnea and no lower extremity edema, no PND and no orthopnea.   Neurology awake and alert ENT with no pallor Cardiovascular with S1 and S2 present and regular with no gallops, rubs or murmurs Respiratory with no rales or wheezing, no rhonchi Abdomen with no distention  No lower extremity edema   Condition at discharge: stable  The results of significant diagnostics from this hospitalization (including imaging, microbiology, ancillary and laboratory) are listed below for reference.   Imaging Studies: CT Chest Wo Contrast  Result Date: 02/27/2023 CLINICAL DATA:  Chronic dyspnea EXAM: CT CHEST WITHOUT CONTRAST TECHNIQUE: Multidetector CT imaging of the chest was performed following the standard protocol without IV contrast. RADIATION DOSE REDUCTION: This exam was performed according to the departmental dose-optimization program which includes automated exposure control, adjustment of the mA and/or kV according to patient size and/or use of iterative reconstruction technique. COMPARISON:  12/24/2022  FINDINGS: Cardiovascular: No significant coronary artery calcification. Stable cardiomegaly. Left subclavian dual lead pacemaker in place with leads within the right atrium and right ventricle toward the apex. No pericardial effusion. Central pulmonary arteries are of normal caliber. Thoracic aorta is unremarkable. Mediastinum/Nodes: No enlarged mediastinal or axillary lymph nodes. Thyroid gland, trachea, and esophagus demonstrate no significant findings. Small hiatal hernia. Lungs/Pleura: Mild bibasilar dependent atelectasis. Lungs are otherwise clear. No pneumothorax or pleural effusion. Upper Abdomen: No acute abnormality. Musculoskeletal: No chest wall mass or suspicious bone lesions identified. IMPRESSION: 1. No acute intrathoracic pathology identified. No definite  radiographic explanation for the patient's reported symptoms. 2. Stable cardiomegaly. 3. Small hiatal hernia. Electronically Signed   By: Helyn Numbers M.D.   On: 02/27/2023 23:10   DG Chest 2 View  Result Date: 02/27/2023 CLINICAL DATA:  Acute on chronic dyspnea EXAM: CHEST - 2 VIEW COMPARISON:  Chest radiograph dated 07/23/2022 FINDINGS: Lines/tubes: Left chest wall ICD leads project over the right atrium and ventricle. Lungs: Left basilar patchy opacity. Pleura: No pneumothorax or pleural effusion. Heart/mediastinum: Similar enlarged cardiomediastinal silhouette. Bones: No acute osseous abnormality. Partially imaged left humeral hardware appears intact. IMPRESSION: 1. Left basilar patchy opacity, which may represent atelectasis or infection. 2. Similar cardiomegaly. Electronically Signed   By: Agustin Cree M.D.   On: 02/27/2023 20:37   DG Lumbar Spine Complete  Addendum Date: 02/25/2023   ADDENDUM REPORT: 02/25/2023 11:24 ADDENDUM: Compared to the 2018 examination in the compression deformities appears slightly worsened. To evaluate for the presence of an acute fracture superimposed on chronic deformity, consider MRI. Electronically Signed    By: Layla Maw M.D.   On: 02/25/2023 11:24   Result Date: 02/25/2023 CLINICAL DATA:  Pain for 11 months. EXAM: LUMBAR SPINE - COMPLETE 5 VIEW COMPARISON:  08/29/2016 FINDINGS: Mild anterior wedge compression deformities at T12 and L1 are a stable finding. Degenerative disc disease throughout the visualized thoracic and lumbar spine with marginal osteophytes and disc space narrowing at each thoracic and lumbar level. No spondylolisthesis or spondylolysis. No osteolytic or osteoblastic changes. IMPRESSION: Degenerative disc disease. Stable T12-L1 compression deformities. No acute osseous abnormalities. Electronically Signed: By: Layla Maw M.D. On: 02/24/2023 15:41   DG Thoracic Spine W/Swimmers  Result Date: 02/24/2023 CLINICAL DATA:  Pain. EXAM: THORACIC SPINE - 3 VIEWS COMPARISON:  08/29/2016 FINDINGS: Lower thoracic compression deformity at T12 and L1 level. Lower thoracic disc disease with marginal osteophytes. No acute compression deformity, spondylolisthesis, osteolytic or osteoblastic lesions. IMPRESSION: Lower thoracic degenerative changes and stable mild compression deformities at T12-L1. Electronically Signed   By: Layla Maw M.D.   On: 02/24/2023 15:42   CUP PACEART REMOTE DEVICE CHECK  Result Date: 02/18/2023 Scheduled remote reviewed. Normal device function.  Next remote 91 days. LA, CVRS   Microbiology: Results for orders placed or performed during the hospital encounter of 05/03/22  MRSA Next Gen by PCR, Nasal     Status: None   Collection Time: 05/03/22 10:48 PM   Specimen: Nasal Mucosa; Nasal Swab  Result Value Ref Range Status   MRSA by PCR Next Gen NOT DETECTED NOT DETECTED Final    Comment: (NOTE) The GeneXpert MRSA Assay (FDA approved for NASAL specimens only), is one component of a comprehensive MRSA colonization surveillance program. It is not intended to diagnose MRSA infection nor to guide or monitor treatment for MRSA infections. Test performance is  not FDA approved in patients less than 38 years old. Performed at [name], [address] [place], [place] [address]   C Difficile Quick Screen w PCR reflex     Status: None   Collection Time: 05/05/22  8:35 PM   Specimen: Stool  Result Value Ref Range Status   C Diff antigen NEGATIVE NEGATIVE Final   C Diff toxin NEGATIVE NEGATIVE Final   C Diff interpretation No C. difficile detected.  Final    Comment: Performed at [name], [address], [place], [place] [address]  Gastrointestinal Panel by PCR , Stool     Status: None   Collection Time: 05/06/22  9:53 PM   Specimen: Stool  Result Value Ref Range Status   Campylobacter species NOT DETECTED NOT DETECTED Final   Plesimonas shigelloides NOT DETECTED NOT DETECTED Final   Salmonella species NOT DETECTED NOT DETECTED Final   Yersinia enterocolitica NOT DETECTED NOT DETECTED Final   Vibrio species NOT DETECTED NOT DETECTED Final   Vibrio cholerae NOT DETECTED NOT DETECTED Final   Enteroaggregative E coli (EAEC) NOT DETECTED NOT DETECTED Final   Enteropathogenic E coli (EPEC) NOT DETECTED NOT DETECTED Final   Enterotoxigenic E coli (ETEC) NOT DETECTED NOT DETECTED Final   Shiga like toxin producing E coli (STEC) NOT DETECTED NOT DETECTED Final   Shigella/Enteroinvasive E coli (EIEC) NOT DETECTED NOT DETECTED Final   Cryptosporidium NOT DETECTED NOT DETECTED Final   Cyclospora cayetanensis NOT DETECTED NOT DETECTED Final   Entamoeba histolytica NOT DETECTED NOT DETECTED Final   Giardia lamblia NOT DETECTED NOT DETECTED Final   Adenovirus F40/41 NOT DETECTED NOT DETECTED Final   Astrovirus NOT DETECTED NOT DETECTED Final   Norovirus GI/GII NOT DETECTED NOT DETECTED Final   Rotavirus A NOT DETECTED NOT DETECTED Final   Sapovirus (I, II, IV, and V) NOT DETECTED NOT DETECTED Final    Comment: Performed at [name], [address] [place], [place] [address]   *Note: Due to a large number of results  and/or encounters for the requested time period, some results have not been displayed. A complete set of results can be found in Results Review.    Labs: CBC: Recent Labs  Lab 02/27/23 1842 02/28/23 0255  WBC 5.7 4.9  HGB 13.3 13.1  HCT 42.0 40.8  MCV 96.8 95.6  PLT 281 270   Basic Metabolic Panel: Recent Labs  Lab 02/27/23 1842 02/28/23 0255 03/01/23 0226 03/02/23 0240 03/03/23 0226  NA 137 137 139 135 136  K 3.4* 3.3* 3.9 3.9 4.0  CL 99 99 99 98 100  CO2 22 27 28 24 25   GLUCOSE 281* 217* 171* 200* 193*  BUN 25* 26* 31* 39* 46*  CREATININE 2.55* 2.73* 2.46* 2.58* 2.87*  CALCIUM 9.0 8.9 9.5 9.3 8.9  MG  --  2.3 2.5*  --  2.6*   Liver Function Tests: No results for input(s): "AST", "ALT", "ALKPHOS", "BILITOT", "PROT", "ALBUMIN" in the last 168 hours. CBG: Recent Labs  Lab 03/02/23 0613 03/02/23 1205 03/02/23 1619 03/02/23 2122 03/03/23 0552  GLUCAP 189* 340* 181* 144* 178*    Discharge time spent: greater than 30 minutes.  Signed: Coralie Keens, MD [facility] 03/03/2023

## 2023-03-03 NOTE — TOC Transition Note (Signed)
Transition of Care [facility]) - CM/SW Discharge Note   Patient Details  Name: PRANESH SPRAGGS MRN: [id number] Date of Birth: 09-09-1965  Transition of Care [facility]) CM/SW Contact:  Leone Haven, RN Phone Number: 03/03/2023, 9:55 AM   Clinical Narrative:    For dc today, has no needs.  He has transportation.     Barriers to Discharge: Continued Medical Work up   Patient Goals and CMS Choice   Choice offered to / list presented to : NA  Discharge Placement                         Discharge Plan and Services Additional resources added to the After Visit Summary for   In-house Referral: NA Discharge Planning Services: CM Consult Post Acute Care Choice: NA                    HH Arranged: NA          Social Determinants of Health (SDOH) Interventions SDOH Screenings   Food Insecurity: No Food Insecurity (02/28/2023)  Housing: Low Risk  (02/28/2023)  Transportation Needs: No Transportation Needs (02/28/2023)  Utilities: At Risk (02/28/2023)  Alcohol Screen: Low Risk  (10/13/2018)  Depression (PHQ2-9): Medium Risk (02/13/2023)  Financial Resource Strain: High Risk (05/08/2022)  Social Connections: Moderately Integrated (03/12/2021)  Tobacco Use: Medium Risk (02/27/2023)     Readmission Risk Interventions     No data to display

## 2023-03-03 NOTE — Plan of Care (Signed)
Problem: Activity: Goal: Ability to return to baseline activity level will improve Outcome: Progressing

## 2023-03-03 NOTE — Progress Notes (Signed)
Remote ICD transmission.   

## 2023-03-03 NOTE — Progress Notes (Signed)
Mobility Specialist Progress Note:    03/03/23 1012  Mobility  Activity Ambulated independently in hallway  Level of Assistance Modified independent, requires aide device or extra time  Assistive Device None  Distance Ambulated (ft) 450 ft  Activity Response Tolerated well  Mobility Referral Yes  $Mobility charge 1 Mobility  Mobility Specialist Start Time (ACUTE ONLY) 0957  Mobility Specialist Stop Time (ACUTE ONLY) 1007  Mobility Specialist Time Calculation (min) (ACUTE ONLY) 10 min   Received pt in bed having no complaints and agreeable to mobility. Pt was asymptomatic throughout ambulation and returned to room w/o fault. Left in bed w/ call bell in reach and all needs met.   Thompson Grayer Mobility Specialist  Please contact vis Secure Chat or  Rehab Office [phone]

## 2023-03-04 ENCOUNTER — Telehealth: Payer: Self-pay

## 2023-03-04 ENCOUNTER — Encounter (HOSPITAL_COMMUNITY): Payer: Medicaid Other

## 2023-03-04 DIAGNOSIS — F411 Generalized anxiety disorder: Secondary | ICD-10-CM | POA: Diagnosis not present

## 2023-03-04 DIAGNOSIS — F4312 Post-traumatic stress disorder, chronic: Secondary | ICD-10-CM | POA: Diagnosis not present

## 2023-03-04 NOTE — Transitions of Care (Post Inpatient/ED Visit) (Signed)
03/04/2023  Name: [name] MRN: [id number] DOB: 1966/03/15  Today's TOC FU Call Status: Today's TOC FU Call Status:: Successful TOC FU Call Completed TOC FU Call Complete Date: 03/04/23 Patient's Name and Date of Birth confirmed.  Transition Care Management Follow-up Telephone Call Date of Discharge: 03/03/23 Discharge Facility: [facility]) Type of Discharge: Inpatient Admission Primary Inpatient Discharge Diagnosis:: acute on chronic systolic CHF How have you been since you were released from the hospital?: Better Any questions or concerns?:  (not disucssed, he needed to end the call. He was at a doctor's appt.)  Items Reviewed: Did you receive and understand the discharge instructions provided?: Yes Medications obtained,verified, and reconciled?: Partial Review Completed Reason for Partial Mediation Review: he said he has all of his medications and he did not have any questions at this time.  he then said he was at a doctor's appointment and could not talk and would need to call back. Any new allergies since your discharge?: No Dietary orders reviewed?: No Do you have support at home?: Yes Name of Support/Comfort Primary Source: he sttated he has help at home  Medications Reviewed Today: Medications Reviewed Today   Medications were not reviewed in this encounter     Home Care and Equipment/Supplies: Were Home Health Services Ordered?: No Any new equipment or medical supplies ordered?: No  Functional Questionnaire: Do you need assistance with bathing/showering or dressing?:  (not disucssed, he needed to end the call. He was at a doctor's appt.) Do you need assistance with meal preparation?:  (not disucssed, he needed to end the call. He was at a doctor's appt.) Do you need assistance with eating?:  (not disucssed, he needed to end the call. He was at a doctor's appt.) Do you have difficulty maintaining continence:  (not disucssed, he needed to end the call. He was  at a doctor's appt.) Do you need assistance with getting out of bed/getting out of a chair/moving?:  (not disucssed, he needed to end the call. He was at a doctor's appt.) Do you have difficulty managing or taking your medications?:  (not disucssed, he needed to end the call. He was at a doctor's appt.)  Follow up appointments reviewed: PCP Follow-up appointment confirmed?: No (instructed him to call to schedule an appointment) Specialist Hospital Follow-up appointment confirmed?: Yes Date of Specialist follow-up appointment?: 03/13/23 Follow-Up Specialty Provider:: CHF clinic Do you need transportation to your follow-up appointment?:  (not disucssed, he needed to end the call.  He was at a doctor's appt.) Do you understand care options if your condition(s) worsen?:  (not disucssed, he needed to end the call. He was at a doctor's appt.)    SIGNATURE Robyne Peers, RN

## 2023-03-05 ENCOUNTER — Telehealth: Payer: Self-pay

## 2023-03-05 NOTE — Transitions of Care (Post Inpatient/ED Visit) (Signed)
03/05/2023  Name: [name] MRN: [id number] DOB: Oct 03, 1965  Today's TOC FU Call Status: Today's TOC FU Call Status:: Successful TOC FU Call Completed TOC FU Call Complete Date: 03/05/23 Patient's Name and Date of Birth confirmed.  Transition Care Management Follow-up Telephone Call Date of Discharge: 03/03/23 Dis[facility] Facility[facility]) Type of Discharge: Inpatient Admission Primary Inpatient Discharge Diagnosis:: Acute-on-Chronic Congestive Heart Failure How have you been since you were released from the hospital?: Better Any questions or concerns?: No  Items Reviewed: Did you receive and understand the discharge instructions provided?: Yes Medications obtained,verified, and reconciled?: Yes (Medications Reviewed) Any new allergies since your discharge?: No Dietary orders reviewed?: Yes Type of Diet Ordered:: Low Sodium, Heart healthy, Carb Modified diet Do you have support at home?: Yes People in Home: sibling(s) Name of Support/Comfort Primary Source: manuel, brother of patient  Medications Reviewed Today: Medications Reviewed Today     Reviewed by Marcos Eke, RN (Registered Nurse) on 03/05/23 at 1343  Med List Status: <None>   Medication Order Taking? Sig Documenting Provider Last Dose Status Informant  Accu-Chek Softclix Lancets lancets [id number] Yes Use as instructed Lonell Grandchild, MD Taking Active Self, Pharmacy Records  acetaminophen (TYLENOL) 500 MG tablet [id number] Yes Take 1,000 mg by mouth 2 (two) times daily. [provider] Taking Active Self, Pharmacy Records  albuterol (VENTOLIN HFA) 108 (90 Base) MCG/ACT inhaler [id number] No Inhale 2 puffs into the lungs every 6 (six) hours as needed for wheezing or shortness of breath.  Patient not taking: Reported on 02/23/2023   Glenford Bayley, NP Not Taking Active Self, Pharmacy Records  amiodarone (PACERONE) 200 MG tablet [id number] Yes Take 1 tablet (200 mg total) by mouth daily.  Sheilah Pigeon, PA-C Taking Active Self, Pharmacy Records  aspirin 81 MG tablet [id number] Yes Take 81 mg by mouth daily. [provider] Taking Active Self, Pharmacy Records  atorvastatin (LIPITOR) 10 MG tablet [id number] Yes Take 1 tablet (10 mg total) by mouth daily. Marcine Matar, MD Taking Active Self, Pharmacy Records  Blood Glucose Monitoring Suppl (ACCU-CHEK GUIDE) w/Device Andria Rhein [id number] Yes UAD Lonell Grandchild, MD Taking Active Self, Pharmacy Records  Blood Glucose Monitoring Suppl DEVI [id number] Yes 1 each by Does not apply route in the morning, at noon, and at bedtime. May substitute to any manufacturer covered by patient's insurance. Carlus Pavlov, MD Taking Active Self, Pharmacy Records  cetirizine [facility] ALLERGY RELIEF, CETIRIZINE,) 10 MG tablet [id number] Yes Take 1 tablet (10 mg total) by mouth daily. Ambs, Norvel Richards, FNP Taking Active Self, Pharmacy Records  cholecalciferol (VITAMIN D3) 25 MCG (1000 UNIT) tablet [id number] Yes Take 1,000 Units by mouth daily. [provider] Taking Active Self, Pharmacy Records  clobetasol cream (TEMOVATE) 0.05 % [id number] Yes Apply 1 Application topically as needed. [provider] Taking Active Self, Pharmacy Records  clonazePAM (KLONOPIN) 0.5 MG tablet [id number] Yes Take 1 tablet (0.5 mg total) by mouth 2 (two) times daily as needed for anxiety.  Patient taking differently: Take 0.5 mg by mouth 2 (two) times daily.   Stasia Cavalier, MD Taking Active Self, Pharmacy Records  Continuous Blood Gluc Sensor (FREESTYLE LIBRE 2 SENSOR) [place] [id number] Yes Use to check blood sugar TID. Change sensor once every 14 days. E11.65 Marcine Matar, MD Taking Active Self, Pharmacy Records  dapagliflozin propanediol (FARXIGA) 10 MG TABS tablet [id number] Yes Take 1 tablet (10 mg total) by mouth daily before breakfast. Dorthula Nettles, DO Taking Active Self,  Pharmacy Records  EPINEPHrine 0.3 mg/0.3 mL IJ SOAJ injection [id number]  Yes Inject 0.3 mg into the muscle as needed. Alfonse Spruce, MD Taking Active Self, Pharmacy Records  famotidine (PEPCID) 20 MG tablet [id number] Yes TAKE 1 TABLET BY MOUTH 1 TO 2 TIMES DAILY AS NEEDED Ambs, Norvel Richards, FNP Taking Active Self, Pharmacy Records  fluticasone [facility]) 50 MCG/ACT nasal spray [id number] No Use 1-2 sprays in each nostril once a day as needed for nasal congestion.  Patient not taking: Reported on 03/05/2023   Alfonse Spruce, MD Not Taking Active Self, Pharmacy Records  Fluticasone-Umeclidin-[facility]) [id number]  Inhale 1 puff into the lungs daily. [provider]  Active Self, Pharmacy Records           Med Note Lia Hopping, [place]   Fri Feb 27, 2023 11:18 PM) Pt states he has this with him and has only ever used it one other time and it made his heart feel weird. He stated he wants to try it again while he is in the hospital so if something goes wrong he will have help  gabapentin (NEURONTIN) 300 MG capsule [id number] Yes Take 1 capsule by mouth at bedtime Marcine Matar, MD Taking Active Self, Pharmacy Records  glucose blood (ACCU-CHEK GUIDE) test strip [id number] Yes Use as instructed Lonell Grandchild, MD Taking Active Self, Pharmacy Records  glucose blood (FREESTYLE LITE) test strip [id number] Yes Use as instructed.  Please make sure this is compatible with [facility] 2 reader. Marcine Matar, MD Taking Active Self, Pharmacy Records  insulin aspart (NOVOLOG) 100 UNIT/ML injection [id number] Yes Inject 24 Units into the skin 3 (three) times daily with meals. [provider] Taking Active Self, Pharmacy Records  insulin glargine (LANTUS SOLOSTAR) 100 UNIT/ML Solostar Pen [id number] Yes Inject 36 Units into the skin daily. Carlus Pavlov, MD Taking Active Self, Pharmacy Records  Insulin Pen Needle (PEN NEEDLES) 31G X 8 MM MISC [id number] Yes UAD Marcine Matar, MD Taking Active Self, Pharmacy Records  ipratropium-albuterol  (DUONEB) 0.5-2.5 (3) MG/3ML SOLN [id number] No Take 3 mLs by nebulization every 6 (six) hours as needed.  Patient not taking: Reported on 03/05/2023   Lupita Leash, MD Not Taking Active Self, Pharmacy Records  ketoconazole (NIZORAL) 2 % cream [id number] Yes Apply 1 Application topically daily. Standiford, Jenelle Mages, DPM Taking Active Self, Pharmacy Records  Lancet Devices (ACCU-CHEK [facility]) lancets [id number] Yes 1 each by Other route 3 (three) times daily. Dessa Phi, MD Taking Active Self, Pharmacy Records  methocarbamol (ROBAXIN) 500 MG tablet [id number] Yes Take 500 mg by mouth daily. [provider] Taking Active Self, Pharmacy Records  metoprolol succinate (TOPROL-XL) 50 MG 24 hr tablet [id number] Yes Take 1 tablet (50 mg total) by mouth daily. Dorthula Nettles, DO Taking Active Self, Pharmacy Records  sacubitril-valsartan [facility]) 24-26 MG [id number] Yes Take 1 tablet by mouth 2 (two) times daily. Dorthula Nettles, DO Taking Active Self, Pharmacy Records  sertraline (ZOLOFT) 25 MG tablet [id number] Yes Take 1 tablet (25 mg total) by mouth daily. Stasia Cavalier, MD Taking Active Self, Pharmacy Records  spironolactone (ALDACTONE) 25 MG tablet [id number] Yes Take 0.5 tablets (12.5 mg total) by mouth daily. Dorthula Nettles, DO Taking Active Self, Pharmacy Records  tamsulosin (FLOMAX) 0.4 MG CAPS capsule [id number] Yes Take 2 capsules (0.8 mg total) by mouth daily. Marcine Matar, MD Taking Active Self, Pharmacy Records  torsemide [facility]) 20 MG tablet [id number] Yes Take 4 tablets (80 mg total)  by mouth daily. Dorthula Nettles, DO Taking Active Self, Pharmacy Records            Home Care and Equipment/Supplies: Were Home Health Services Ordered?: No Any new equipment or medical supplies ordered?: No  Functional Questionnaire: Do you need assistance with bathing/showering or dressing?: No Do you need assistance with meal preparation?: No Do you need assistance  with eating?: No Do you have difficulty maintaining continence: No Do you need assistance with getting out of bed/getting out of a chair/moving?: No Do you have difficulty managing or taking your medications?: No  Follow up appointments reviewed: PCP Follow-up appointment confirmed?: Yes Date of PCP follow-up appointment?:  (not sure of upcoming date of PCP appt) Follow-up Provider: Dr. Jonah Blue Specialist [facility] Follow-up appointment confirmed?: Yes Date of Specialist follow-up appointment?: 03/13/23 Follow-Up Specialty Provider:: Heart Failure Clinic Do you need transportation to your follow-up appointment?: No Do you understand care options if your condition(s) worsen?: Yes-patient verbalized understanding    Alyse Low, RN, BA, [facility], CRRN [facility] Population Health Care Management Coordinator, Transition of Care Ph # [phone]

## 2023-03-05 NOTE — Patient Instructions (Signed)
Visit Information  Thank you for taking time to visit with me today. Please don't hesitate to contact me if I can be of assistance to you before our next scheduled telephone appointment.  Our next appointment is by telephone on Tuesday, Oct 1 at 330pm  Following is a copy of your care plan:   Goals Addressed             This Visit's Progress    Patient Stated       Current Barriers: Knowledge Deficits regarding plan of care for management of Congestive Heart Failure and other co-morbidities that patient has, including Diabetes, Hyperlipidemia, COPD, and Chronic Kidney Disease.  RNCM Clinical Goal(s):  Patient will work with the Care Management team over the next 30 days to address Transition of Care Barriers: Becoming knowledgeable regarding signs and symptoms of Congestive Heart Failure Exacerbation in order to address symptoms of fluid overload effectively at home and prevent avoidable hospitalizations. Patient is to continue taking Torsemide 80mg  by mouth daily with instructions to increase this med to twice daily in case of volume overload, 2-3 pounds weight gain in 24 hrs or 5 pounds weight gain in 7 days. take all medications exactly as prescribed and will call provider for medication related questions as evidenced by Pharmacy Refill records and verbalization of the importance of calling MD if he does have a question about specific meds  continue to work with RN Care Manager to address care management and care coordination needs related to  CHF as evidenced by adherence to CM Team Scheduled appointments through collaboration with RN Care manager, provider, and care team.   Interventions: Evaluation of current treatment plan related to  self management and patient's adherence to plan as established by provider   Heart Failure Interventions:  (Status:  New goal.) Short Term Goal Basic overview and discussion of pathophysiology of Heart Failure reviewed Provided education on low  sodium diet Assessed need for readable accurate scales in home Advised patient to weigh each morning after emptying bladder Discussed importance of daily weight and advised patient to weigh and record daily Discussed the importance of keeping all appointments with provider Assessed social determinant of health barriers   Patient Goals/Self-Care Activities: Participate in Transition of Care Program/Attend TOC scheduled calls Take all medications as prescribed Attend all scheduled provider appointments Call pharmacy for medication refills 3-7 days in advance of running out of medications Call provider office for new concerns or questions   Follow Up Plan:  Telephone appointment with RNCM/Transition of Care 4 week post-hospitalization program next Tuesday, Oct 1 at 330pm.           Patient verbalizes understanding of instructions and care plan provided today and agrees to view in [place]. Active MyChart status and patient understanding of how to access instructions and care plan via MyChart confirmed with patient.     The patient has been provided with contact information for the care management team and has been advised to call with any health related questions or concerns.   Please call the care guide team at [phone] if you need to cancel or reschedule your appointment.   Please call 1-800-273-TALK (toll free, 24 hour hotline) if you are experiencing a Mental Health or Behavioral Health Crisis or need someone to talk to.  Alyse Low, RN, BA, [facility], CRRN [facility] Coordinator, Transition of Care Ph # [phone]

## 2023-03-06 ENCOUNTER — Other Ambulatory Visit: Payer: Self-pay | Admitting: Internal Medicine

## 2023-03-06 ENCOUNTER — Encounter: Payer: Self-pay | Admitting: Internal Medicine

## 2023-03-06 ENCOUNTER — Other Ambulatory Visit: Payer: Self-pay | Admitting: Podiatry

## 2023-03-06 ENCOUNTER — Other Ambulatory Visit: Payer: Self-pay | Admitting: Family Medicine

## 2023-03-06 DIAGNOSIS — E1165 Type 2 diabetes mellitus with hyperglycemia: Secondary | ICD-10-CM

## 2023-03-06 DIAGNOSIS — Z794 Long term (current) use of insulin: Secondary | ICD-10-CM

## 2023-03-06 MED ORDER — FREESTYLE LIBRE 2 SENSOR MISC
11 refills | Status: DC
Start: 2023-03-06 — End: 2023-10-08

## 2023-03-06 NOTE — Telephone Encounter (Signed)
Requested medication (s) are due for refill today:   Not sure  It was dependent on his insurance covering it or not.  Requested medication (s) are on the active medication list:   Yes but conflicting information.  It was discontinued 02/01/2022 but on 07/01/2022 2 each with 11 refills was given.  Future visit scheduled:   Yes   Last ordered: Provider to review for refills.    Requested Prescriptions  Pending Prescriptions Disp Refills   Continuous Glucose Receiver (FREESTYLE LIBRE 2 READER) DEVI [Pharmacy Med Name: FREESTYLE LIBRE 2 READER MIS] 1 each 0    Sig: USE AS DIRECTED     Endocrinology: Diabetes - Testing Supplies Passed - 03/06/2023  8:36 AM      Passed - Valid encounter within last 12 months    Recent Outpatient Visits           3 weeks ago Type 2 diabetes mellitus with morbid obesity (HCC)   [place] [facility] Jon[facility] Blue B, MD   4 months ago Type 2 diabetes mellitus with obesity [facility])   [place] [facility] Jonah Blue B, MD   7 months ago Type 2 diabetes mellitus with obesity [facility])   [place] [facility] Marcine Matar, MD   8 months ago Suicidal ideation   [place] [facility] Marcine Matar, MD   9 months ago Panic attacks   [place] [facility] Marcine Matar, MD       Future Appointments             In 3 months Laural Benes, Binnie Rail, MD [facility]

## 2023-03-09 ENCOUNTER — Other Ambulatory Visit: Payer: Self-pay | Admitting: Internal Medicine

## 2023-03-09 ENCOUNTER — Telehealth: Payer: Self-pay | Admitting: Internal Medicine

## 2023-03-09 NOTE — Telephone Encounter (Signed)
Called but no answer. LVM to call back to further clarify if patient needs a prior authorization to be done or only additional refills sent to the pharmacy.

## 2023-03-09 NOTE — Telephone Encounter (Signed)
Requested Prescriptions  Pending Prescriptions Disp Refills   Insulin Pen Needle (B-D ULTRAFINE III SHORT PEN) 31G X 8 MM MISC [Pharmacy Med Name: BD UF III SHORT PEN NEED MIS] 100 each 0    Sig: USE AS DIRECTED     Endocrinology: Diabetes - Testing Supplies Passed - 03/06/2023 10:43 PM      Passed - Valid encounter within last 12 months    Recent Outpatient Visits           3 weeks ago Type 2 diabetes mellitus with morbid obesity (HCC)   [place] [facility] B, MD   4 months ago Type 2 diabetes mellitus with obesity [facility])   [place] [facility] Jonah Blue B, MD   7 months ago Type 2 diabetes mellitus with obesity [facility])   [place] [facility] Marcine Matar, MD   8 months ago Suicidal ideation   [place] [facility] Marcine Matar, MD   9 months ago Panic attacks   [place] [facility] Marcine Matar, MD       Future Appointments             In 3 months Laural Benes, Binnie Rail, MD [facility]

## 2023-03-09 NOTE — Telephone Encounter (Signed)
Pt is calling back to confirm that he is in need of a PA for his insulin.

## 2023-03-09 NOTE — Telephone Encounter (Unsigned)
Copied from CRM [id number]. Topic: General - Inquiry >> Mar 09, 2023  2:43 PM Haroldine Laws wrote: Reason for CRM: pt called asking that Dr. Laural Benes call his insurance company about his insulin .  He is completely out.  Hasn't had any for 4 days.  The pharmacy has 3 boxes they got in today.

## 2023-03-10 ENCOUNTER — Other Ambulatory Visit: Payer: Self-pay

## 2023-03-10 ENCOUNTER — Telehealth: Payer: Self-pay

## 2023-03-10 ENCOUNTER — Other Ambulatory Visit: Payer: Medicaid Other

## 2023-03-10 NOTE — Patient Outreach (Signed)
Care Management  Transitions of Care Program Managed Medicaid Transitions of Care week 2   03/10/2023 Name: [name] MRN: [id number] DOB: 04/29/1966  Subjective: [name] is a 57 y.o. year old male who is a primary care patient of Marcine Matar, MD. The Care Management team Engaged with patient Engaged with patient by telephone to assess and address transitions of care needs.   Consent to Services:  Patient was given information about Managed Medicaid Care Management services, agreed to services, and gave verbal consent to participate.   Assessment: Week 2 appointment rescheduled to tomorrow, Wednesday 10/2 @ 11am as patient is in the process of trying to obtain his Freestyle Libra 2 sensor which required Prior Auth. It appears the [facility] was successful in getting prescription approved and patient is checking with his pharmacy for pick up today. Will follow up with patient tomorrow, as discussed.   Plan: The patient has been provided with contact information for the care management team and has been advised to call with any health related questions or concerns.   Alyse Low, RN, BA, [facility], CRRN [facility] Coordinator, Transition of Care Ph # [phone]

## 2023-03-10 NOTE — Telephone Encounter (Signed)
Requested medication (s) are due for refill today: yes   Requested medication (s) are on the active medication list: yes   Last refill:  03/09/23 #100 0 refills  Future visit scheduled: yes in 3 months  Notes to clinic:  Pharmacy comment: please provide max daily use.       Requested Prescriptions  Pending Prescriptions Disp Refills   B-D ULTRAFINE III SHORT PEN 31G X 8 MM MISC [Pharmacy Med Name: BD UF III SHORT PEN NEED MIS] 100 each 0    Sig: USE AS DIRECTED     Endocrinology: Diabetes - Testing Supplies Passed - 03/09/2023  9:24 AM      Passed - Valid encounter within last 12 months    Recent Outpatient Visits           3 weeks ago Type 2 diabetes mellitus with morbid obesity (HCC)   [place] [facility] Jonah Blue B, MD   4 months ago Type 2 diabetes mellitus with obesity [facility])   [place] [facility] Jonah Blue B, MD   7 months ago Type 2 diabetes mellitus with obesity [facility])   [place] [facility] Marcine Matar, MD   8 months ago Suicidal ideation   [place] [facility] Marcine Matar, MD   9 months ago Panic attacks   [place] [facility] Marcine Matar, MD       Future Appointments             In 3 months Laural Benes, Binnie Rail, MD [facility]

## 2023-03-10 NOTE — Telephone Encounter (Signed)
Pharmacy Patient Advocate Encounter   Received notification from CoverMyMeds that prior authorization for FREESTYLE LITE SENSORS is required/requested.   Insurance verification completed.   The patient is insured through [employer] .   Per test claim: PA required; PA submitted to [facility] via CoverMyMeds Key/confirmation #/EOC [id number] Status is pending

## 2023-03-10 NOTE — Telephone Encounter (Signed)
Noted! Thank you

## 2023-03-11 ENCOUNTER — Other Ambulatory Visit: Payer: Self-pay

## 2023-03-11 ENCOUNTER — Other Ambulatory Visit: Payer: Medicaid Other

## 2023-03-11 ENCOUNTER — Telehealth: Payer: Self-pay

## 2023-03-11 NOTE — Patient Outreach (Signed)
Care Management  Transitions of Care Program Transitions of Care Post-discharge Week 1   03/11/2023 Name: [name] MRN: [id number] DOB: 07-Jan-1966  Subjective: [name] is a 57 y.o. year old male who is a primary care patient of [name] Matar, MD. The Care Management team Engaged with patient Engaged with patient by telephone to assess and address transitions of care needs.   Consent to Services:  Patient was given information about Managed Medicaid Care Management services, agreed to services, and gave verbal consent to participate.   Assessment:           SDOH Interventions    Flowsheet Row CARDIAC REHAB PHASE II ORIENTATION from 09/25/2022 in [facility] ED to Hosp-Admission (Discharged) from 05/03/2022 in [facility] 4E CV SURGICAL PROGRESSIVE CARE Patient Outreach Telephone from 04/17/2021 in [facility] Care Coordination Patient Outreach Telephone from 03/12/2021 in [facility] Care Coordination Telephone from 12/11/2020 in [facility] Care Coordination  SDOH Interventions       Food Insecurity Interventions -- -- -- -- Intervention Not Indicated  Housing Interventions -- -- Intervention Not Indicated -- Intervention Not Indicated  Transportation Interventions -- -- -- Intervention Not Indicated --  Utilities Interventions -- Inpatient TOC -- -- --  Depression Interventions/Treatment  Medication, Counseling, Currently on Treatment -- -- -- --  Financial Strain Interventions -- Inpatient TOC -- -- --  Social Connections Interventions -- -- -- Intervention Not Indicated --        Goals Addressed             This Visit's Progress    Patient Stated       Current Barriers: Knowledge Deficits regarding plan of care for management of Congestive Heart Failure and other co-morbidities that patient has, including Diabetes, Hyperlipidemia, COPD,  and Chronic Kidney Disease.  RNCM Clinical Goal(s):  Patient will work with the Care Management team over the next 30 days to address Transition of Care Barriers: Becoming knowledgeable regarding signs and symptoms of Congestive Heart Failure Exacerbation in order to address symptoms of fluid overload effectively at home and prevent avoidable hospitalizations. Patient is to continue taking Torsemide 80mg  by mouth daily with instructions to increase this med to twice daily in case of volume overload, 2-3 pounds weight gain in 24 hrs or 5 pounds weight gain in 7 days. take all medications exactly as prescribed and will call provider for medication related questions as evidenced by Pharmacy Refill records and verbalization of the importance of calling MD if he does have a question about specific meds  continue to work with RN Care Manager to address care management and care coordination needs related to  CHF as evidenced by adherence to CM Team Scheduled appointments through collaboration with RN Care manager, provider, and care team.   Interventions: Evaluation of current treatment plan related to  self management and patient's adherence to plan as established by provider   Heart Failure Interventions:  (Status:  New goal.) Short Term Goal Basic overview and discussion of pathophysiology of Heart Failure reviewed Provided education on low sodium diet Assessed need for readable accurate scales in home Advised patient to weigh each morning after emptying bladder Discussed importance of daily weight and advised patient to weigh and record daily Discussed the importance of keeping all appointments with provider Assessed social determinant of health barriers   Patient Goals/Self-Care Activities: Participate in Transition of Care Program/Attend TOC scheduled  calls Take all medications as prescribed Attend all scheduled provider appointments Call pharmacy for medication refills 3-7 days in advance of  running out of medications Call provider office for new concerns or questions   Follow Up Plan:  Telephone appointment with RNCM/Transition of Care week 2 post-hospitalization program next Tuesday, Oct 8 at 245pm.           Plan: The patient has been provided with contact information for the care management team and has been advised to call with any health related questions or concerns.   Alyse Low, RN, BA, [facility], CRRN [facility] Coordinator, Transition of Care Ph # [phone]

## 2023-03-11 NOTE — Patient Instructions (Signed)
Visit Information  Thank you for taking time to visit with me today. Please don't hesitate to contact me if I can be of assistance to you before our next scheduled telephone appointment.  Our next appointment is by telephone on Tuesday, 10/8 at 245pm  Following is a copy of your care plan:   Goals Addressed             This Visit's Progress    Patient Stated       Current Barriers: Knowledge Deficits regarding plan of care for management of Congestive Heart Failure and other co-morbidities that patient has, including Diabetes, Hyperlipidemia, COPD, and Chronic Kidney Disease.  RNCM Clinical Goal(s):  Patient will work with the Care Management team over the next 30 days to address Transition of Care Barriers: Becoming knowledgeable regarding signs and symptoms of Congestive Heart Failure Exacerbation in order to address symptoms of fluid overload effectively at home and prevent avoidable hospitalizations. Patient is to continue taking Torsemide 80mg  by mouth daily with instructions to increase this med to twice daily in case of volume overload, 2-3 pounds weight gain in 24 hrs or 5 pounds weight gain in 7 days. take all medications exactly as prescribed and will call provider for medication related questions as evidenced by Pharmacy Refill records and verbalization of the importance of calling MD if he does have a question about specific meds  continue to work with RN Care Manager to address care management and care coordination needs related to  CHF as evidenced by adherence to CM Team Scheduled appointments through collaboration with RN Care manager, provider, and care team.   Interventions: Evaluation of current treatment plan related to  self management and patient's adherence to plan as established by provider   Heart Failure Interventions:  (Status:  New goal.) Short Term Goal Basic overview and discussion of pathophysiology of Heart Failure reviewed Provided education on low sodium  diet Assessed need for readable accurate scales in home Advised patient to weigh each morning after emptying bladder Discussed importance of daily weight and advised patient to weigh and record daily Discussed the importance of keeping all appointments with provider Assessed social determinant of health barriers   Patient Goals/Self-Care Activities: Participate in Transition of Care Program/Attend TOC scheduled calls Take all medications as prescribed Attend all scheduled provider appointments Call pharmacy for medication refills 3-7 days in advance of running out of medications Call provider office for new concerns or questions   Follow Up Plan:  Telephone appointment with RNCM/Transition of Care week 2 post-hospitalization program next Tuesday, Oct 8 at 245pm.           Patient verbalizes understanding of instructions and care plan provided today and agrees to view in [place]. Active MyChart status and patient understanding of how to access instructions and care plan via MyChart confirmed with patient.     The patient has been provided with contact information for the care management team and has been advised to call with any health related questions or concerns.   Please call the care guide team at [phone] if you need to cancel or reschedule your appointment.   Please call 1-800-273-TALK (toll free, 24 hour hotline) if you are experiencing a Mental Health or Behavioral Health Crisis or need someone to talk to.  Alyse Low, RN, BA, [facility], CRRN [facility] Coordinator, Transition of Care Ph # [phone]

## 2023-03-12 ENCOUNTER — Other Ambulatory Visit: Payer: Self-pay | Admitting: Podiatry

## 2023-03-12 ENCOUNTER — Other Ambulatory Visit: Payer: Self-pay

## 2023-03-12 MED ORDER — KETOCONAZOLE 2 % EX CREA: 1.0000 | TOPICAL_CREAM | Freq: Every day | CUTANEOUS | 6 refills | Status: DC

## 2023-03-13 ENCOUNTER — Encounter (HOSPITAL_COMMUNITY): Payer: Medicaid Other

## 2023-03-16 ENCOUNTER — Encounter (HOSPITAL_COMMUNITY): Payer: Self-pay

## 2023-03-16 ENCOUNTER — Emergency Department (HOSPITAL_COMMUNITY)
Admission: EM | Admit: 2023-03-16 | Discharge: 2023-03-17 | Disposition: A | Discharge status: Home / Self Care | Payer: Medicaid Other | Attending: Emergency Medicine | Admitting: Emergency Medicine

## 2023-03-16 ENCOUNTER — Other Ambulatory Visit: Payer: Self-pay

## 2023-03-16 ENCOUNTER — Emergency Department (HOSPITAL_COMMUNITY): Payer: Medicaid Other

## 2023-03-16 DIAGNOSIS — R1012 Left upper quadrant pain: Secondary | ICD-10-CM | POA: Insufficient documentation

## 2023-03-16 DIAGNOSIS — J9811 Atelectasis: Secondary | ICD-10-CM | POA: Diagnosis not present

## 2023-03-16 DIAGNOSIS — K5900 Constipation, unspecified: Secondary | ICD-10-CM | POA: Diagnosis not present

## 2023-03-16 DIAGNOSIS — R0602 Shortness of breath: Secondary | ICD-10-CM | POA: Diagnosis not present

## 2023-03-16 DIAGNOSIS — Z87891 Personal history of nicotine dependence: Secondary | ICD-10-CM | POA: Diagnosis not present

## 2023-03-16 DIAGNOSIS — I110 Hypertensive heart disease with heart failure: Secondary | ICD-10-CM | POA: Insufficient documentation

## 2023-03-16 DIAGNOSIS — J449 Chronic obstructive pulmonary disease, unspecified: Secondary | ICD-10-CM | POA: Insufficient documentation

## 2023-03-16 DIAGNOSIS — E119 Type 2 diabetes mellitus without complications: Secondary | ICD-10-CM | POA: Insufficient documentation

## 2023-03-16 DIAGNOSIS — K5730 Diverticulosis of large intestine without perforation or abscess without bleeding: Secondary | ICD-10-CM | POA: Diagnosis not present

## 2023-03-16 DIAGNOSIS — I509 Heart failure, unspecified: Secondary | ICD-10-CM | POA: Diagnosis not present

## 2023-03-16 DIAGNOSIS — R109 Unspecified abdominal pain: Secondary | ICD-10-CM

## 2023-03-16 DIAGNOSIS — I517 Cardiomegaly: Secondary | ICD-10-CM | POA: Diagnosis not present

## 2023-03-16 LAB — CBC WITH DIFFERENTIAL/PLATELET
Abs Immature Granulocytes: 0.02 10*3/uL (ref 0.00–0.07)
Basophils Absolute: 0.1 10*3/uL (ref 0.0–0.1)
Basophils Relative: 1 %
Eosinophils Absolute: 0.2 10*3/uL (ref 0.0–0.5)
Eosinophils Relative: 3 %
HCT: 44.7 % (ref 39.0–52.0)
Hemoglobin: 14.2 g/dL (ref 13.0–17.0)
Immature Granulocytes: 0 %
Lymphocytes Relative: 32 %
Lymphs Abs: 1.9 10*3/uL (ref 0.7–4.0)
MCH: 30.5 pg (ref 26.0–34.0)
MCHC: 31.8 g/dL (ref 30.0–36.0)
MCV: 96.1 fL (ref 80.0–100.0)
Monocytes Absolute: 0.6 10*3/uL (ref 0.1–1.0)
Monocytes Relative: 10 %
Neutro Abs: 3.1 10*3/uL (ref 1.7–7.7)
Neutrophils Relative %: 54 %
Platelets: 254 10*3/uL (ref 150–400)
RBC: 4.65 MIL/uL (ref 4.22–5.81)
RDW: 14.1 % (ref 11.5–15.5)
WBC: 5.8 10*3/uL (ref 4.0–10.5)
nRBC: 0.5 % — ABNORMAL HIGH (ref 0.0–0.2)

## 2023-03-16 LAB — URINALYSIS, ROUTINE W REFLEX MICROSCOPIC
Bilirubin Urine: NEGATIVE
Glucose, UA: >=500 mg/dL — AB
Hgb urine dipstick: NEGATIVE
Ketones, ur: NEGATIVE mg/dL
Leukocytes,Ua: NEGATIVE
Nitrite: NEGATIVE
Protein, ur: NEGATIVE mg/dL
Specific Gravity, Urine: 1.007 (ref 1.005–1.030)
pH: 5 (ref 5.0–8.0)

## 2023-03-16 LAB — BASIC METABOLIC PANEL
Anion gap: 14 (ref 5–15)
BUN: 30 mg/dL — ABNORMAL HIGH (ref 6–20)
CO2: 23 mmol/L (ref 22–32)
Calcium: 9.2 mg/dL (ref 8.9–10.3)
Chloride: 102 mmol/L (ref 98–111)
Creatinine, Ser: 2.73 mg/dL — ABNORMAL HIGH (ref 0.61–1.24)
GFR, Estimated: 26 mL/min — ABNORMAL LOW (ref >60)
Glucose, Bld: 192 mg/dL — ABNORMAL HIGH (ref 70–99)
Potassium: 3.9 mmol/L (ref 3.5–5.1)
Sodium: 139 mmol/L (ref 135–145)

## 2023-03-16 LAB — BRAIN NATRIURETIC PEPTIDE: B Natriuretic Peptide: 29.6 pg/mL (ref 0.0–100.0)

## 2023-03-16 LAB — TROPONIN I (HIGH SENSITIVITY): Troponin I (High Sensitivity): 8 ng/L (ref <18)

## 2023-03-16 NOTE — ED Triage Notes (Signed)
Pt came in via POV d/t SOB for the past 4 days, does have Hx of COPD. Also reports lower Lt sided back pain that radiates around to his Lt side abd. Denies any recent falls/injuries, or n/v. A/Ox4, rates pain 8/10 while in triage.

## 2023-03-16 NOTE — ED Provider Triage Note (Signed)
Emergency Medicine Provider Triage Evaluation Note  [name] , a 57 y.o. male  was evaluated in triage.  Pt complains of shortness of breath x4 days as well as left flank pain that radiates to the LUQ. Pain is worse with movement.   Review of Systems  Positive: SOB, left flank pain radiating to LUQ Negative: Fever, cough, dysuria  Physical Exam  BP (!) 90/53 (BP Location: Right Arm)   Pulse 72   Temp 97.9 F (36.6 C)   Resp 17   Ht 6' (1.829 m)   Wt 132.5 kg   SpO2 93%   BMI 39.60 kg/m  Gen:   Awake, no distress   Resp:  Normal effort  MSK:   Moves extremities without difficulty, no LLE, no midline tenderness Other:    Medical Decision Making  Medically screening exam initiated at 5:20 PM.  Appropriate orders placed.  IZIAH [name] was informed that the remainder of the evaluation will be completed by another provider, this initial triage assessment does not replace that evaluation, and the importance of remaining in the ED until their evaluation is complete.     [name] Marion, PA-C 03/16/23 1723
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
## 2023-03-17 ENCOUNTER — Ambulatory Visit: Payer: Medicaid Other | Admitting: Orthopaedic Surgery

## 2023-03-17 ENCOUNTER — Emergency Department (HOSPITAL_COMMUNITY): Payer: Medicaid Other

## 2023-03-17 ENCOUNTER — Ambulatory Visit (HOSPITAL_BASED_OUTPATIENT_CLINIC_OR_DEPARTMENT_OTHER): Payer: Medicaid Other | Admitting: Psychiatry

## 2023-03-17 ENCOUNTER — Other Ambulatory Visit: Payer: Medicaid Other

## 2023-03-17 ENCOUNTER — Encounter (HOSPITAL_COMMUNITY): Payer: Self-pay | Admitting: Psychiatry

## 2023-03-17 VITALS — BP 120/80 | HR 70 | Ht 72.0 in | Wt 300.0 lb

## 2023-03-17 DIAGNOSIS — K5730 Diverticulosis of large intestine without perforation or abscess without bleeding: Secondary | ICD-10-CM | POA: Diagnosis not present

## 2023-03-17 DIAGNOSIS — F4310 Post-traumatic stress disorder, unspecified: Secondary | ICD-10-CM | POA: Diagnosis not present

## 2023-03-17 DIAGNOSIS — F410 Panic disorder [episodic paroxysmal anxiety] without agoraphobia: Secondary | ICD-10-CM

## 2023-03-17 DIAGNOSIS — F411 Generalized anxiety disorder: Secondary | ICD-10-CM

## 2023-03-17 DIAGNOSIS — K5900 Constipation, unspecified: Secondary | ICD-10-CM | POA: Diagnosis not present

## 2023-03-17 LAB — TROPONIN I (HIGH SENSITIVITY): Troponin I (High Sensitivity): 9 ng/L (ref <18)

## 2023-03-17 MED ORDER — SERTRALINE HCL 50 MG PO TABS
50.0000 mg | ORAL_TABLET | Freq: Every day | ORAL | 0 refills | Status: DC
Start: 2023-03-17 — End: 2023-04-28

## 2023-03-17 MED ORDER — METHOCARBAMOL 500 MG PO TABS
500.0000 mg | ORAL_TABLET | Freq: Once | ORAL | Status: AC
  Administered 2023-03-17: 500 mg via ORAL
  Filled 2023-03-17: qty 1

## 2023-03-17 MED ORDER — CLONAZEPAM 0.5 MG PO TABS: ORAL_TABLET | ORAL | 1 refills | Status: DC

## 2023-03-17 NOTE — Discharge Instructions (Signed)
You were evaluated in the Emergency Department and after careful evaluation, we did not find any emergent condition requiring admission or further testing in the hospital.  Your exam/testing today was overall reassuring.  Please return to the Emergency Department if you experience any worsening of your condition.  Thank you for allowing us to be a part of your care.  
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
## 2023-03-17 NOTE — ED Provider Notes (Signed)
[facility] DEPT [facility] Emergency Department Provider Note MRN:  [id number]  Arrival date & time: 03/17/23     Chief Complaint   Shortness of Breath, Back Pain, and Abdominal Pain   History of Present Illness   [name] is a 57 y.o. year-old male with a history of CHF, ICD, diabetes presenting to the ED with chief complaint of flank pain.  Pain to the left flank, left upper quadrant, radiating to the back.  Present for 4 days.  Denies shortness of breath, no recent fever cough, no chest pain.  Review of Systems  A thorough review of systems was obtained and all systems are negative except as noted in the HPI and PMH.   Patient's Health History    Past Medical History:  Diagnosis Date   AICD (automatic cardioverter/defibrillator) present    Chronic bronchitis (HCC)    "get it most q yr" (12/23/2013)   Chronic systolic (congestive) heart failure (HCC)    Fracture of left humerus    a. 07/2013.   GERD (gastroesophageal reflux disease)    not at present time   Heart murmur    "born w/it"    History of renal calculi    Hypertension    Kidney stones    NICM (nonischemic cardiomyopathy) (HCC)    a. 07/2013 Echo: EF 20-25%, diff HK, Gr2 DD, mild MR, mod dil LA/RA. 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    Shockable heart rhythm detected by automated external defibrillator    Syncope    Type II diabetes mellitus (HCC) 2006    Past Surgical History:  Procedure Laterality Date   CARDIAC CATHETERIZATION  08/10/13   CHOLECYSTECTOMY  ~ 2010   IMPLANTABLE CARDIOVERTER DEFIBRILLATOR IMPLANT  12/23/2013   STJ Fortify ICD implanted by Dr Johney Frame for cardiomyopathy and syncope   IMPLANTABLE CARDIOVERTER DEFIBRILLATOR IMPLANT N/A 12/23/2013   Procedure: IMPLANTABLE CARDIOVERTER DEFIBRILLATOR IMPLANT;  Surgeon: Gardiner Rhyme, MD;  Location: [facility] CATH LAB;  Service: Cardiovascular;  Laterality: N/A;    INGUINAL HERNIA REPAIR Left 03/01/2018   Procedure: OPEN REPAIR OF LEFT INGUINAL HERNIA WITH MESH;  Surgeon: Gaynelle Adu, MD;  Location: [facility] ORS;  Service: General;  Laterality: Left;   LEAD REVISION/REPAIR N/A 05/15/2022   Procedure: LEAD REVISION/REPAIR;  Surgeon: Lanier Prude, MD;  Location: [facility] INVASIVE CV LAB;  Service: Cardiovascular;  Laterality: N/A;   LEFT HEART CATHETERIZATION WITH CORONARY ANGIOGRAM N/A 08/10/2013   Procedure: LEFT HEART CATHETERIZATION WITH CORONARY ANGIOGRAM;  Surgeon: Corky Crafts, MD;  Location: [facility] CATH LAB;  Service: Cardiovascular;  Laterality: N/A;   ORIF HUMERUS FRACTURE Left 08/12/2013   Procedure: OPEN REDUCTION INTERNAL FIXATION (ORIF) HUMERAL SHAFT FRACTURE;  Surgeon: Nadara Mustard, MD;  Location: [facility] OR;  Service: Orthopedics;  Laterality: Left;  Open Reduction Internal Fixation Left Humerus   RIGHT/LEFT HEART CATH AND CORONARY ANGIOGRAPHY N/A 05/12/2022   Procedure: RIGHT/LEFT HEART CATH AND CORONARY ANGIOGRAPHY;  Surgeon: Dorthula Nettles, DO;  Location: [facility] INVASIVE CV LAB;  Service: Cardiovascular;  Laterality: N/A;   URETEROSCOPY     "laser for kidney stones"    Family History  Problem Relation Age of Onset   Diabetes Father    Arthritis Father    Hypertension Father    Diabetes Mother    Arthritis Mother    Hypertension Mother    Hypertension Brother     Social History   Socioeconomic  History   Marital status: Single    Spouse name: Not on file   Number of children: Not on file   Years of education: Not on file   Highest education level: Not on file  Occupational History   Occupation: Disabled  Tobacco Use   Smoking status: Former    Current packs/day: 0.00    Average packs/day: 1 pack/day for 28.0 years (28.0 ttl pk-yrs)    Types: Cigarettes    Start date: 06/03/1986    Quit date: 06/03/2014    Years since quitting: 8.7   Smokeless tobacco: Never  Vaping Use   Vaping status: Never Used  Substance and Sexual Activity    Alcohol use: Yes   Drug use: Yes    Types: Marijuana    Comment: occasionally   Sexual activity: Yes  Other Topics Concern   Not on file  Social History Narrative   Pt lives with his brother    Social Determinants of Health   Financial Resource Strain: High Risk (05/08/2022)   Overall Financial Resource Strain (CARDIA)    Difficulty of Paying Living Expenses: Very hard  Food Insecurity: No Food Insecurity (03/05/2023)   Hunger Vital Sign    Worried About Running Out of Food in the Last Year: Never true    Ran Out of Food in the Last Year: Never true  Transportation Needs: No Transportation Needs (03/05/2023)   PRAPARE - Administrator, Civil Service (Medical): No    Lack of Transportation (Non-Medical): No  Physical Activity: Not on file  Stress: Not on file  Social Connections: Moderately Integrated (03/12/2021)   Social Connection and Isolation Panel [NHANES]    Frequency of Communication with Friends and Family: More than three times a week    Frequency of Social Gatherings with Friends and Family: More than three times a week    Attends Religious Services: More than 4 times per year    Active Member of [employer] or Organizations: Yes    Attends Banker Meetings: Never    Marital Status: Never married  Intimate Partner Violence: Not At Risk (02/28/2023)   Humiliation, Afraid, Rape, and Kick questionnaire    Fear of Current or Ex-Partner: No    Emotionally Abused: No    Physically Abused: No    Sexually Abused: No     Physical Exam   Vitals:   03/17/23 0515 03/17/23 0530  BP: 106/77 116/71  Pulse: 63 64  Resp: (!) 22 17  Temp:    SpO2: (!) 88% 94%    CONSTITUTIONAL: Well-appearing, NAD NEURO/PSYCH:  Alert and oriented x 3, no focal deficits EYES:  eyes equal and reactive ENT/NECK:  no LAD, no JVD CARDIO: Regular rate, well-perfused, normal S1 and S2 PULM:  CTAB no wheezing or rhonchi GI/GU:  non-distended, non-tender MSK/SPINE:  No gross  deformities, no edema SKIN:  no rash, atraumatic   *Additional and/or pertinent findings included in MDM below  Diagnostic and Interventional Summary    EKG Interpretation Date/Time:  Monday March 16 2023 17:59:11 EDT Ventricular Rate:  66 PR Interval:  198 QRS Duration:  116 QT Interval:  432 QTC Calculation: 452 R Axis:   160  Text Interpretation: Normal sinus rhythm Low voltage QRS Possible Anterolateral infarct , age undetermined Abnormal ECG When compared with ECG of 27-Feb-2023 18:46, PREVIOUS ECG IS PRESENT No significant change since last tracing Confirmed by Marily Memos [phone]) on 03/17/2023 1:27:49 AM       Labs Reviewed  BASIC METABOLIC PANEL - Abnormal; Notable for the following components:      Result Value   Glucose, Bld 192 (*)    BUN 30 (*)    Creatinine, Ser 2.73 (*)    GFR, Estimated 26 (*)    All other components within normal limits  CBC WITH DIFFERENTIAL/PLATELET - Abnormal; Notable for the following components:   nRBC 0.5 (*)    All other components within normal limits  URINALYSIS, ROUTINE W REFLEX MICROSCOPIC - Abnormal; Notable for the following components:   Color, Urine STRAW (*)    Glucose, UA >=500 (*)    Bacteria, UA RARE (*)    All other components within normal limits  BRAIN NATRIURETIC PEPTIDE  TROPONIN I (HIGH SENSITIVITY)  TROPONIN I (HIGH SENSITIVITY)    CT ABDOMEN PELVIS WO CONTRAST  Final Result    DG Chest 2 View  Final Result      Medications  methocarbamol (ROBAXIN) tablet 500 mg (500 mg Oral Given 03/17/23 0427)     Procedures  /  Critical Care Procedures  ED Course and Medical Decision Making  Initial Impression and Ddx Differential diagnosis includes kidney stone, musculoskeletal pain, splenic infarct, retroperitoneal bleeding, pyelonephritis, overall highly doubt PE, doubt ACS.  No signs or symptoms of myelopathy.  Past medical/surgical history that increases complexity of ED encounter: CHF  Interpretation of  Diagnostics I personally reviewed the EKG and my interpretation is as follows: Sinus rhythm  Labs reassuring with no significant blood count or electrolyte disturbance.  Troponin negative  Patient Reassessment and Ultimate Disposition/Management     CT is without acute process.  Patient resting comfortably on my reassessment with normal vital signs, no emergent process is evident and appropriate for discharge.  Patient management required discussion with the following services or consulting groups:  None  Complexity of Problems Addressed Acute illness or injury that poses threat of life of bodily function  Additional Data Reviewed and Analyzed Further history obtained from: Prior labs/imaging results  Additional Factors Impacting ED Encounter Risk None  Elmer Sow. Pilar Plate, MD [facility] Emergency Medicine [facility] [email or web address]  Final Clinical Impressions(s) / ED Diagnoses     ICD-10-CM   1. Flank pain  R10.9       ED Discharge Orders     None        Discharge Instructions Discussed with and Provided to Patient:     Discharge Instructions      You were evaluated in the Emergency Department and after careful evaluation, we did not find any emergent condition requiring admission or further testing in the hospital.  Your exam/testing today was overall reassuring.  Please return to the Emergency Department if you experience any worsening of your condition.  Thank you for allowing [place] to be a part of your care.        Sabas Sous, MD 03/17/23 [phone]

## 2023-03-18 ENCOUNTER — Telehealth: Payer: Self-pay

## 2023-03-18 ENCOUNTER — Other Ambulatory Visit: Payer: Medicaid Other

## 2023-03-19 NOTE — Patient Instructions (Signed)
Visit Information  Thank you for taking time to visit with me today. Please don't hesitate to contact me if I can be of assistance to you before our next scheduled telephone appointment.  Our next appointment is by telephone on 03/24/23 at 3:30pm  Following is a copy of your care plan:   Goals Addressed             This Visit's Progress    Patient Stated       Current Barriers: Knowledge Deficits regarding plan of care for management of Congestive Heart Failure and other co-morbidities that patient has, including Diabetes, Hyperlipidemia, COPD, and Chronic Kidney Disease.  RNCM Clinical Goal(s):  Patient will work with the Care Management team over the next 30 days to address Transition of Care Barriers: Becoming knowledgeable regarding signs and symptoms of Congestive Heart Failure Exacerbation in order to address symptoms of fluid overload effectively at home and prevent avoidable hospitalizations. Patient is to continue taking Torsemide 80mg  by mouth daily with instructions to increase this med to twice daily in case of volume overload, 2-3 pounds weight gain in 24 hrs or 5 pounds weight gain in 7 days. 10/9 UPDATE Discussed patients continued adherence to plan of daily AM weights and using additional prn Torsemide as needed per parameters above.Patient has kept a log of his daily weights and states he has had some minor fluctuations in weight gain/lss but not enough to need additional prn dosing. Compliant with scheduled diuretics he takes daily. take all medications exactly as prescribed and will call provider for medication related questions as evidenced by Pharmacy Refill records and verbalization of the importance of calling MD if he does have a question about specific meds  continue to work with RN Care Manager to address care management and care coordination needs related to  CHF as evidenced by adherence to CM Team Scheduled appointments through collaboration with RN Care manager,  provider, and care team.   Interventions: Evaluation of current treatment plan related to  self management and patient's adherence to plan as established by provider   Heart Failure Interventions:  (Status:  On Track) Short Term Goal Basic overview and discussion of pathophysiology of Heart Failure reviewed Provided education on low sodium diet Assessed need for readable accurate scales in home Advised patient to weigh each morning after emptying bladder Discussed importance of daily weight and advised patient to weigh and record daily Discussed the importance of keeping all appointments with provider Assessed social determinant of health barriers   Patient Goals/Self-Care Activities: Participate in Transition of Care Program/Attend TOC scheduled calls Take all medications as prescribed Attend all scheduled provider appointments Call pharmacy for medication refills 3-7 days in advance of running out of medications Call provider office for new concerns or questions   UPDATE TO CARE PLAN initiated during 30d TOC - See additional Care Plan for chronic back pain management  Current Barriers:  Knowledge Deficits related to plan of care for management of chronic back pain aggravated by need to take care of his elderly mother along with his siblings. Patient is responsible for helping patient with transfers and bathing.   RNCM Clinical Goal(s):  Patient will verbalize understanding of plan for management of Chronic back pain as evidenced by report of results of follow up appt with Pain Management MD on 10/11   through collaboration with RN Care manager, provider, and care team. Patient currently complains of up to 10/10 back pain and currently takes Tylenol 1,000 mg 2-3 times per  day as well as gabapentin. Does not want to take any narcotics. Plans to be own patient advocate with Pain management MD to arrive at a plan of care for his back pain that fits his beliefs and  lifestyle.  Interventions: Evaluation of current treatment plan related to  self management and patient's adherence to plan as established by provider   Pain Interventions:  (Status:  New goal.) Short Term Goal Pain assessment performed Medications reviewed Reviewed provider established plan for pain management Discussed importance of adherence to all scheduled medical appointments Counseled on the importance of reporting any/all new or changed pain symptoms or management strategies to pain management provider Advised patient to report to care team affect of pain on daily activities Advised patient to discuss desire for non-opiate medication management of his chronic back pain with provider Assessed social determinant of health barriers  Patient Goals/Self-Care Activities: Participate in Transition of Care Program/Attend TOC scheduled calls Take all medications as prescribed Attend all scheduled provider appointments Call pharmacy for medication refills 3-7 days in advance of running out of medications Perform all self care activities independently  Perform IADL's (shopping, preparing meals, housekeeping, managing finances) independently Call provider office for new concerns or questions   Follow Up Plan:  The patient has been provided with contact information for the care management team and has been advised to call with any health related questions or concerns.    Telephone appointment with RNCM/Transition of Care week 3 post-hospitalization program next Tuesday, Oct 8 at 245pm.         Patient verbalizes understanding of instructions and care plan provided today and agrees to view in MyChart. Active MyChart status and patient understanding of how to access instructions and care plan via MyChart confirmed with patient.     The patient has been provided with contact information for the care management team and has been advised to call with any health related questions or  concerns.   Please call the care guide team at [phone] if you need to cancel or reschedule your appointment.   Please call 1-800-273-TALK (toll free, 24 hour hotline) if you are experiencing a Mental Health or Behavioral Health Crisis or need someone to talk to.  Alyse Low, RN, BA, [facility], CRRN [facility] Coordinator, Transition of Care Ph # [phone]

## 2023-03-19 NOTE — Patient Outreach (Signed)
Care Management  Transitions of Care Program Managed Medicaid Transitions of Care week 3   03/19/2023 Name: [name] MRN: [id number] DOB: 1965-12-19  Subjective: [name] is a 57 y.o. year old male who is a primary care patient of Marcine Matar, MD. The Care Management team Engaged with patient Engaged with patient by telephone to assess and address transitions of care needs.   Consent to Services:  Patient was given information about Managed Medicaid Care Management services, agreed to services, and gave verbal consent to participate.   Assessment:  Assessment attached  SDOH Interventions    Flowsheet Row CARDIAC REHAB PHASE II ORIENTATION from 09/25/2022 in [facility] ED to Hosp-Admission (Discharged) from 05/03/2022 in [facility] 4E CV SURGICAL PROGRESSIVE CARE Patient Outreach Telephone from 04/17/2021 in [facility] Care Coordination Patient Outreach Telephone from 03/12/2021 in [facility] Care Coordination Telephone from 12/11/2020 in [facility] Care Coordination  SDOH Interventions       Food Insecurity Interventions -- -- -- -- Intervention Not Indicated  Housing Interventions -- -- Intervention Not Indicated -- Intervention Not Indicated  Transportation Interventions -- -- -- Intervention Not Indicated --  Utilities Interventions -- Inpatient TOC -- -- --  Depression Interventions/Treatment  Medication, Counseling, Currently on Treatment -- -- -- --  Financial Strain Interventions -- Inpatient TOC -- -- --  Social Connections Interventions -- -- -- Intervention Not Indicated --        Goals Addressed             This Visit's Progress    Patient Stated       Current Barriers: Knowledge Deficits regarding plan of care for management of Congestive Heart Failure and other co-morbidities that patient has, including Diabetes,  Hyperlipidemia, COPD, and Chronic Kidney Disease.  RNCM Clinical Goal(s):  Patient will work with the Care Management team over the next 30 days to address Transition of Care Barriers: Becoming knowledgeable regarding signs and symptoms of Congestive Heart Failure Exacerbation in order to address symptoms of fluid overload effectively at home and prevent avoidable hospitalizations. Patient is to continue taking Torsemide 80mg  by mouth daily with instructions to increase this med to twice daily in case of volume overload, 2-3 pounds weight gain in 24 hrs or 5 pounds weight gain in 7 days. 10/9 UPDATE Discussed patients continued adherence to plan of daily AM weights and using additional prn Torsemide as needed per parameters above.Patient has kept a log of his daily weights and states he has had some minor fluctuations in weight gain/lss but not enough to need additional prn dosing. Compliant with scheduled diuretics he takes daily. take all medications exactly as prescribed and will call provider for medication related questions as evidenced by Pharmacy Refill records and verbalization of the importance of calling MD if he does have a question about specific meds  continue to work with RN Care Manager to address care management and care coordination needs related to  CHF as evidenced by adherence to CM Team Scheduled appointments through collaboration with RN Care manager, provider, and care team.   Interventions: Evaluation of current treatment plan related to  self management and patient's adherence to plan as established by provider   Heart Failure Interventions:  (Status:  On Track) Short Term Goal Basic overview and discussion of pathophysiology of Heart Failure reviewed Provided education on low sodium diet Assessed need for readable accurate scales in  home Advised patient to weigh each morning after emptying bladder Discussed importance of daily weight and advised patient to weigh and record  daily Discussed the importance of keeping all appointments with provider Assessed social determinant of health barriers   Patient Goals/Self-Care Activities: Participate in Transition of Care Program/Attend TOC scheduled calls Take all medications as prescribed Attend all scheduled provider appointments Call pharmacy for medication refills 3-7 days in advance of running out of medications Call provider office for new concerns or questions   UPDATE TO CARE PLAN initiated during 30d TOC - See additional Care Plan for chronic back pain management  Current Barriers:  Knowledge Deficits related to plan of care for management of chronic back pain aggravated by need to take care of his elderly mother along with his siblings. Patient is responsible for helping patient with transfers and bathing.   RNCM Clinical Goal(s):  Patient will verbalize understanding of plan for management of Chronic back pain as evidenced by report of results of follow up appt with Pain Management MD on 10/11   through collaboration with RN Care manager, provider, and care team. Patient currently complains of up to 10/10 back pain and currently takes Tylenol 1,000 mg 2-3 times per day as well as gabapentin. Does not want to take any narcotics. Plans to be own patient advocate with Pain management MD to arrive at a plan of care for his back pain that fits his beliefs and lifestyle.  Interventions: Evaluation of current treatment plan related to  self management and patient's adherence to plan as established by provider   Pain Interventions:  (Status:  New goal.) Short Term Goal Pain assessment performed Medications reviewed Reviewed provider established plan for pain management Discussed importance of adherence to all scheduled medical appointments Counseled on the importance of reporting any/all new or changed pain symptoms or management strategies to pain management provider Advised patient to report to care team affect  of pain on daily activities Advised patient to discuss desire for non-opiate medication management of his chronic back pain with provider Assessed social determinant of health barriers  Patient Goals/Self-Care Activities: Participate in Transition of Care Program/Attend TOC scheduled calls Take all medications as prescribed Attend all scheduled provider appointments Call pharmacy for medication refills 3-7 days in advance of running out of medications Perform all self care activities independently  Perform IADL's (shopping, preparing meals, housekeeping, managing finances) independently Call provider office for new concerns or questions   Follow Up Plan:  The patient has been provided with contact information for the care management team and has been advised to call with any health related questions or concerns.    Telephone appointment with RNCM/Transition of Care week 3 post-hospitalization program next Tuesday, Oct 8 at 245pm.         Plan: The patient has been provided with contact information for the care management team and has been advised to call with any health related questions or concerns.   Alyse Low, RN, BA, [facility], CRRN [facility] Coordinator, Transition of Care Ph # [phone]

## 2023-03-20 ENCOUNTER — Encounter: Payer: Self-pay | Admitting: Physical Medicine and Rehabilitation

## 2023-03-20 ENCOUNTER — Ambulatory Visit: Payer: Medicaid Other | Admitting: Physical Medicine and Rehabilitation

## 2023-03-20 VITALS — BP 104/69 | HR 84

## 2023-03-20 DIAGNOSIS — G8929 Other chronic pain: Secondary | ICD-10-CM

## 2023-03-20 DIAGNOSIS — M461 Sacroiliitis, not elsewhere classified: Secondary | ICD-10-CM | POA: Diagnosis not present

## 2023-03-20 DIAGNOSIS — M545 Low back pain, unspecified: Secondary | ICD-10-CM
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
## 2023-03-24 ENCOUNTER — Other Ambulatory Visit: Payer: Medicaid Other

## 2023-03-24 ENCOUNTER — Encounter: Payer: Self-pay | Admitting: Cardiology

## 2023-03-24 ENCOUNTER — Encounter: Payer: Medicaid Other | Attending: Cardiology

## 2023-03-24 ENCOUNTER — Telehealth: Payer: Self-pay

## 2023-03-24 DIAGNOSIS — I5042 Chronic combined systolic (congestive) and diastolic (congestive) heart failure: Secondary | ICD-10-CM | POA: Insufficient documentation

## 2023-03-24 DIAGNOSIS — Z95810 Presence of automatic (implantable) cardiac defibrillator: Secondary | ICD-10-CM | POA: Diagnosis not present

## 2023-03-24 NOTE — Patient Instructions (Signed)
Visit Information  Thank you for taking time to visit with me today. Today was my last post-hospitalization follow-up call with you. You are doing a great job managing your Congestive Heart Failure by monitoring your weight & fluid fluctuations. Glad to hear your lower back/sacroiliac-related pain is being addressed by Orthopedics. Please don't hesitate to contact me if I can be of assistance to you before our next scheduled telephone appointment.  Warm Regards,  Elnita Maxwell    Following is a copy of your care plan:   Goals Addressed             This Visit's Progress    COMPLETED: Patient Stated       Current Barriers: Knowledge Deficits regarding plan of care for management of Congestive Heart Failure and other co-morbidities that patient has, including Diabetes, Hyperlipidemia, COPD, and Chronic Kidney Disease.  RNCM Clinical Goal(s):  Patient will work with the Care Management team over the next 30 days to address Transition of Care Barriers: Becoming knowledgeable regarding signs and symptoms of Congestive Heart Failure Exacerbation in order to address symptoms of fluid overload effectively at home and prevent avoidable hospitalizations. Patient is to continue taking Torsemide 80mg  by mouth daily with instructions to increase this med to twice daily in case of volume overload, 2-3 pounds weight gain in 24 hrs or 5 pounds weight gain in 7 days. 10/9 UPDATE Discussed patients continued adherence to plan of daily AM weights and using additional prn Torsemide as needed per parameters above.Patient has kept a log of his daily weights and states he has had some minor fluctuations in weight gain/lss but not enough to need additional prn dosing. Compliant with scheduled diuretics he takes daily. take all medications exactly as prescribed and will call provider for medication related questions as evidenced by Pharmacy Refill records and verbalization of the importance of calling MD if he does have a  question about specific meds  continue to work with RN Care Manager to address care management and care coordination needs related to  CHF as evidenced by adherence to CM Team Scheduled appointments through collaboration with RN Care manager, provider, and care team.   Interventions: Evaluation of current treatment plan related to  self management and patient's adherence to plan as established by provider   Heart Failure Interventions:  (Status:  On Track) Short Term Goal Basic overview and discussion of pathophysiology of Heart Failure reviewed Provided education on low sodium diet Assessed need for readable accurate scales in home Advised patient to weigh each morning after emptying bladder Discussed importance of daily weight and advised patient to weigh and record daily Discussed the importance of keeping all appointments with provider Assessed social determinant of health barriers   Patient Goals/Self-Care Activities: Participate in Transition of Care Program/Attend TOC scheduled calls Take all medications as prescribed Attend all scheduled provider appointments Call pharmacy for medication refills 3-7 days in advance of running out of medications Call provider office for new concerns or questions   UPDATE TO CARE PLAN initiated during 30d TOC - See additional Care Plan for chronic back pain management  Current Barriers:  Knowledge Deficits related to plan of care for management of chronic back pain aggravated by need to take care of his elderly mother along with his siblings. Patient is responsible for helping patient with transfers and bathing.   RNCM Clinical Goal(s):  Patient will verbalize understanding of plan for management of Chronic back pain as evidenced by report of results of follow up appt  with Pain Management MD on 10/11   through collaboration with RN Care manager, provider, and care team. Patient currently complains of up to 10/10 back pain and currently takes  Tylenol 1,000 mg 2-3 times per day as well as gabapentin. Does not want to take any narcotics. Plans to be own patient advocate with Pain management MD to arrive at a plan of care for his back pain that fits his beliefs and lifestyle.  Interventions: Evaluation of current treatment plan related to  self management and patient's adherence to plan as established by provider   Pain Interventions:  (Status:  On track) Short Term Goal Pain assessment performed Medications reviewed Reviewed provider established plan for pain management Discussed importance of adherence to all scheduled medical appointments Counseled on the importance of reporting any/all new or changed pain symptoms or management strategies to pain management provider Advised patient to report to care team affect of pain on daily activities Advised patient to discuss desire for non-opiate medication management of his chronic back pain with provider Assessed social determinant of health barriers  Patient Goals/Self-Care Activities: Participate in Transition of Care Program/Attend TOC scheduled calls Take all medications as prescribed Attend all scheduled provider appointments Call pharmacy for medication refills 3-7 days in advance of running out of medications Perform all self care activities independently  Perform IADL's (shopping, preparing meals, housekeeping, managing finances) independently Call provider office for new concerns or questions   Follow Up Plan:  The patient has been provided with contact information for the care management team and has been advised to call with any health related questions or concerns.            Patient verbalizes understanding of instructions and care plan provided today and agrees to view in MyChart. Active MyChart status and patient understanding of how to access instructions and care plan via MyChart confirmed with patient.     The patient has been provided with contact  information for the care management team and has been advised to call with any health related questions or concerns.   Please call the care guide team at [phone] if you need to cancel or reschedule your appointment.   Please call 1-800-273-TALK (toll free, 24 hour hotline) if you are experiencing a Mental Health or Behavioral Health Crisis or need someone to talk to.  Alyse Low, RN, BA, [facility], CRRN [facility] Coordinator, Transition of Care Ph # [phone]

## 2023-03-24 NOTE — Patient Outreach (Signed)
Care Management  Transitions of Care Program Managed Medicaid Transitions of Care week 4   03/24/2023 Name: [name] MRN: [id number] DOB: 25-Aug-1965  Subjective: [name] is a 57 y.o. year old male who is a primary care patient of Marcine Matar, MD. The Care Management team Engaged with patient Engaged with patient by telephone to assess and address transitions of care needs.   Consent to Services:  Patient was given information about Managed Medicaid Care Management services, agreed to services, and gave verbal consent to participate.   Assessment:           SDOH Interventions    Flowsheet Row CARDIAC REHAB PHASE II ORIENTATION from 09/25/2022 in [facility] ED to Hosp-Admission (Discharged) from 05/03/2022 in [facility] 4E CV SURGICAL PROGRESSIVE CARE Patient Outreach Telephone from 04/17/2021 in [facility] Care Coordination Patient Outreach Telephone from 03/12/2021 in [facility] Care Coordination Telephone from 12/11/2020 in [facility] Care Coordination  SDOH Interventions       Food Insecurity Interventions -- -- -- -- Intervention Not Indicated  Housing Interventions -- -- Intervention Not Indicated -- Intervention Not Indicated  Transportation Interventions -- -- -- Intervention Not Indicated --  Utilities Interventions -- Inpatient TOC -- -- --  Depression Interventions/Treatment  Medication, Counseling, Currently on Treatment -- -- -- --  Financial Strain Interventions -- Inpatient TOC -- -- --  Social Connections Interventions -- -- -- Intervention Not Indicated --        Goals Addressed             This Visit's Progress    COMPLETED: Patient Stated       Current Barriers: Knowledge Deficits regarding plan of care for management of Congestive Heart Failure and other co-morbidities that patient has, including Diabetes,  Hyperlipidemia, COPD, and Chronic Kidney Disease.  RNCM Clinical Goal(s):  Patient will work with the Care Management team over the next 30 days to address Transition of Care Barriers: Becoming knowledgeable regarding signs and symptoms of Congestive Heart Failure Exacerbation in order to address symptoms of fluid overload effectively at home and prevent avoidable hospitalizations. Patient is to continue taking Torsemide 80mg  by mouth daily with instructions to increase this med to twice daily in case of volume overload, 2-3 pounds weight gain in 24 hrs or 5 pounds weight gain in 7 days. 10/9 UPDATE Discussed patients continued adherence to plan of daily AM weights and using additional prn Torsemide as needed per parameters above.Patient has kept a log of his daily weights and states he has had some minor fluctuations in weight gain/lss but not enough to need additional prn dosing. Compliant with scheduled diuretics he takes daily. take all medications exactly as prescribed and will call provider for medication related questions as evidenced by Pharmacy Refill records and verbalization of the importance of calling MD if he does have a question about specific meds  continue to work with RN Care Manager to address care management and care coordination needs related to  CHF as evidenced by adherence to CM Team Scheduled appointments through collaboration with RN Care manager, provider, and care team.   Interventions: Evaluation of current treatment plan related to  self management and patient's adherence to plan as established by provider   Heart Failure Interventions:  (Status:  On Track) Short Term Goal Basic overview and discussion of pathophysiology of Heart Failure reviewed Provided education on low sodium diet  Assessed need for readable accurate scales in home Advised patient to weigh each morning after emptying bladder Discussed importance of daily weight and advised patient to weigh and record  daily Discussed the importance of keeping all appointments with provider Assessed social determinant of health barriers   Patient Goals/Self-Care Activities: Participate in Transition of Care Program/Attend TOC scheduled calls Take all medications as prescribed Attend all scheduled provider appointments Call pharmacy for medication refills 3-7 days in advance of running out of medications Call provider office for new concerns or questions   UPDATE TO CARE PLAN initiated during 30d TOC - See additional Care Plan for chronic back pain management  Current Barriers:  Knowledge Deficits related to plan of care for management of chronic back pain aggravated by need to take care of his elderly mother along with his siblings. Patient is responsible for helping patient with transfers and bathing.   RNCM Clinical Goal(s):  Patient will verbalize understanding of plan for management of Chronic back pain as evidenced by report of results of follow up appt with Pain Management MD on 10/11   through collaboration with RN Care manager, provider, and care team. Patient currently complains of up to 10/10 back pain and currently takes Tylenol 1,000 mg 2-3 times per day as well as gabapentin. Does not want to take any narcotics. Plans to be own patient advocate with Pain management MD to arrive at a plan of care for his back pain that fits his beliefs and lifestyle.  Interventions: Evaluation of current treatment plan related to  self management and patient's adherence to plan as established by provider   Pain Interventions:  (Status:  On track) Short Term Goal Pain assessment performed Medications reviewed Reviewed provider established plan for pain management Discussed importance of adherence to all scheduled medical appointments Counseled on the importance of reporting any/all new or changed pain symptoms or management strategies to pain management provider Advised patient to report to care team affect  of pain on daily activities Advised patient to discuss desire for non-opiate medication management of his chronic back pain with provider Assessed social determinant of health barriers  Patient Goals/Self-Care Activities: Participate in Transition of Care Program/Attend TOC scheduled calls Take all medications as prescribed Attend all scheduled provider appointments Call pharmacy for medication refills 3-7 days in advance of running out of medications Perform all self care activities independently  Perform IADL's (shopping, preparing meals, housekeeping, managing finances) independently Call provider office for new concerns or questions   Follow Up Plan:  The patient has been provided with contact information for the care management team and has been advised to call with any health related questions or concerns.            Plan: The patient has been provided with contact information for the care management team and has been advised to call with any health related questions or concerns.   Alyse Low, RN, BA, [facility], CRRN [facility] Coordinator, Transition of Care Ph # [phone]

## 2023-03-25 NOTE — Progress Notes (Signed)
EPIC Encounter for ICM Monitoring  Patient Name: [name] is a 57 y.o. male Date: 03/25/2023 Primary Care Physican: Marcine Matar, MD Primary Cardiologist: Crenshaw/[name] Electrophysiologist: Lalla Brothers 06/17/2022 Office Weight: 251 lbs 08/04/2022 Weight: 271 lbs 08/12/2022 Weight: 271 lbs 09/22/2022 Weight: 281 lbs 12/03/2022 Weight: 282 lbs 01/07/2023 Weight: 280 lbs 02/13/2023 Weight: 297 lbs 03/25/2023 Weight: 297 lbs                           Spoke with patient and heart failure questions reviewed.  Transmission results reviewed.  Pt reports hospitalization 10/7 due to fluid overload but feeling better and back to baseline normal.     Corvue thoracic impedance suggesting normal fluid levels within the last month. 10/7-10/15   Prescribed dosage:  Furosemide 20 mg take 4 tablet(s) (80 mg total) by mouth daily.    Spironolactone 25 mg take 1 tablet (0.5 mg total) by mouth once a day.    Labs: 03/16/2023 Creatinine 2.73, BUN 30, Potassium 3.9, Sodium 139, GFR 26  03/03/2023 Creatinine 2.87, BUN 46, Potassium 4.0, Sodium 136, GFR 25  03/02/2023 Creatinine 2.58, BUN 39, Potassium 3.9, Sodium 135, GFR 28  03/01/2023 Creatinine 2.46, BUN 31, Potassium 3.9, Sodium 139, GFR 30 02/28/2023 Creatinine 2.73, BUN 26, Potassium 3.3, Sodium 137, GFR 26  02/27/2023 Creatinine 2.55, BUN 25, Potassium 3.4, Sodium 137, GFR 28  02/23/2023 Creatinine 2.28, BUN 18, Potassium 3.6, Sodium 140, GFR 32  A complete set of results can be found in Results Review.   Recommendations:  No changes and encouraged to call if experiencing any fluid symptoms.   Follow-up plan: ICM clinic phone appointment on 04/27/2023.   91 day device clinic remote transmission 05/20/2023.     EP/Cardiology Office Visits:   04/03/2023 with Yevette Edwards, PA.  03/27/2023 with HF Clinic.   Copy of ICM check sent to Dr Lalla Brothers.    3 month ICM trend: 03/24/2023.    12-14 Month ICM trend:     Karie Soda,  RN 03/25/2023 8:22 AM

## 2023-03-27 ENCOUNTER — Encounter (HOSPITAL_COMMUNITY): Payer: Medicaid Other

## 2023-03-30 ENCOUNTER — Other Ambulatory Visit: Payer: Self-pay | Admitting: Internal Medicine

## 2023-03-30 ENCOUNTER — Ambulatory Visit (INDEPENDENT_AMBULATORY_CARE_PROVIDER_SITE_OTHER): Payer: Medicaid Other | Admitting: Physical Medicine and Rehabilitation

## 2023-03-30 ENCOUNTER — Other Ambulatory Visit: Payer: Self-pay

## 2023-03-30 VITALS — BP 109/72 | HR 68

## 2023-03-30 DIAGNOSIS — M461 Sacroiliitis, not elsewhere classified: Secondary | ICD-10-CM

## 2023-03-30 NOTE — Progress Notes (Signed)
[name] - 57 y.o. male MRN [id number]  Date of birth: 1966-01-03  Office Visit Note: Visit Date: 03/30/2023 PCP: Marcine Matar, MD Referred by: Marcine Matar, MD  Subjective: Chief Complaint  Patient presents with   Lower Back - Pain   HPI:  [name] is a 57 y.o. male who comes in today at the request of Ellin Goodie, FNP for planned Bilateral anesthetic Sacroiliac joint arthrogram with fluoroscopic guidance.  The patient has failed conservative care including home exercise, medications, time and activity modification.  This injection will be diagnostic and hopefully therapeutic.  Please see requesting physician notes for further details and justification.   Positive Fortin finger sign, Patrick's testing and lateral compression test.    ROS Otherwise per HPI.  Assessment & Plan: Visit Diagnoses:    ICD-10-CM   1. Sacroiliitis (HCC)  M46.1 XR C-ARM NO REPORT    Sacroiliac Joint Inj      Plan: No additional findings.   Meds & Orders: No orders of the defined types were placed in this encounter.   Orders Placed This Encounter  Procedures   Sacroiliac Joint Inj   XR C-ARM NO REPORT    Follow-up: Return for visit to requesting provider as needed.   Procedures: Sacroiliac Joint Inj Bilateral on 03/30/2023 2:21 PM Indications: pain and diagnostic evaluation Details: 22 G 3.5 in needle, fluoroscopy-guided posterior approach Medications (Right): 40 mg triamcinolone acetonide 40 MG/ML; 2 mL bupivacaine 0.5 % Medications (Left): 40 mg triamcinolone acetonide 40 MG/ML; 2 mL bupivacaine 0.5 % Outcome: tolerated well, no immediate complications  Sacroiliac Joint Intra-Articular Injection - Posterior Approach with Fluoroscopic Guidance   Position: PRONE  Additional Comments: Vital signs were monitored before and after the procedure. Patient was prepped and draped in the usual sterile fashion. The correct patient, procedure, and site was  verified.   Injection Procedure Details:   Location/Site:  Sacroiliac joint  Needle size: 3.5 in Spinal Needle  Needle type: Spinal  Needle Placement: Intra-articular  Findings:  -Comments: There was excellent flow of contrast producing a partial arthrogram of the sacroiliac joint.   Procedure Details: Starting with a 90 degree vertical and midline orientation the fluoroscope was tilted cranially 20 to 25 degrees and the target area of the inferior most part of the SI joint on the side mentioned above was visualized.  The soft tissues overlying this target were infiltrated with 4 ml. of 1% Lidocaine without Epinephrine. A #22 gauge spinal needle was inserted perpendicular to the fluoroscope table and advanced into the posterior inferior joint space using fluoroscopic guidance.  Position in the joint space was confirmed by obtaining a partial arthrogram using a 2 ml. volume of Isovue-250 contrast agent. After negative aspirate for gross pus or blood, the injectate was delivered to the joint. Radiographs were obtained for documentation purposes.   Additional Comments:   Dressing: Bandaid    Post-procedure details: Patient was observed during the procedure. Post-procedure instructions were reviewed.  Patient left the clinic in stable condition.    There was excellent flow of contrast producing a partial arthrogram of the sacroiliac joint.  Procedure, treatment alternatives, risks and benefits explained, specific risks discussed. Consent was given by the patient. Immediately prior to procedure a time out was called to verify the correct patient, procedure, equipment, support staff and site/side marked as required. Patient was prepped and draped in the usual sterile fashion.          Clinical History: No specialty  comments available.     Objective:  VS:  HT:    WT:   BMI:     BP:109/72  HR:68bpm  TEMP: ( )  RESP:  Physical Exam   Imaging: No results found.

## 2023-03-30 NOTE — Patient Instructions (Signed)

## 2023-03-30 NOTE — Progress Notes (Signed)
Functional Pain Scale - descriptive words and definitions  Distracting (5)    Aware of pain/able to complete some ADL's but limited by pain/sleep is affected and active distractions are only slightly useful. Moderate range order  Average Pain 6   +Driver, -BT, -Dye Allergies.  Lower back pain in the middle that radiates out to both sides and into the buttocks. Standing makes pain  worse

## 2023-03-31 ENCOUNTER — Other Ambulatory Visit: Payer: Medicaid Other

## 2023-03-31 ENCOUNTER — Other Ambulatory Visit: Payer: Self-pay | Admitting: Internal Medicine

## 2023-03-31 ENCOUNTER — Encounter: Payer: Self-pay | Admitting: Internal Medicine

## 2023-03-31 MED ORDER — MICONAZOLE NITRATE 2 % EX CREA: TOPICAL_CREAM | CUTANEOUS | 0 refills | Status: DC

## 2023-03-31 NOTE — Telephone Encounter (Signed)
Requested Prescriptions  Refused Prescriptions Disp Refills   B-D ULTRAFINE III SHORT PEN 31G X 8 MM MISC [Pharmacy Med Name: BD UF III SHORT PEN NEED MIS] 100 each 0    Sig: USE AS DIRECTED     Endocrinology: Diabetes - Testing Supplies Passed - 03/30/2023  3:11 PM      Passed - Valid encounter within last 12 months    Recent Outpatient Visits           1 month ago Type 2 diabetes mellitus with morbid obesity (HCC)   [place] [facility] Jonah Blue B, MD   5 months ago Type 2 diabetes mellitus with obesity [facility])   [place] [facility] Jonah Blue B, MD   8 months ago Type 2 diabetes mellitus with obesity [facility])   [place] [facility] Marcine Matar, MD   9 months ago Suicidal ideation   [place] [facility] Marcine Matar, MD   10 months ago Panic attacks   [place] [facility] Marcine Matar, MD       Future Appointments             In 2 months Laural Benes, Binnie Rail, MD [facility]

## 2023-04-02 ENCOUNTER — Inpatient Hospital Stay (HOSPITAL_BASED_OUTPATIENT_CLINIC_OR_DEPARTMENT_OTHER)
Admission: EM | Admit: 2023-04-02 | Discharge: 2023-04-07 | DRG: 603 | Disposition: A | Discharge status: Home / Self Care | Payer: Medicaid Other | Attending: Internal Medicine | Admitting: Internal Medicine

## 2023-04-02 ENCOUNTER — Emergency Department (HOSPITAL_BASED_OUTPATIENT_CLINIC_OR_DEPARTMENT_OTHER): Payer: Medicaid Other

## 2023-04-02 ENCOUNTER — Other Ambulatory Visit: Payer: Self-pay

## 2023-04-02 ENCOUNTER — Encounter (HOSPITAL_BASED_OUTPATIENT_CLINIC_OR_DEPARTMENT_OTHER): Payer: Self-pay | Admitting: *Deleted

## 2023-04-02 DIAGNOSIS — E66813 Obesity, class 3: Secondary | ICD-10-CM | POA: Diagnosis present

## 2023-04-02 DIAGNOSIS — F32A Depression, unspecified: Secondary | ICD-10-CM | POA: Diagnosis not present

## 2023-04-02 DIAGNOSIS — I428 Other cardiomyopathies: Secondary | ICD-10-CM | POA: Diagnosis present

## 2023-04-02 DIAGNOSIS — L03315 Cellulitis of perineum: Secondary | ICD-10-CM | POA: Diagnosis not present

## 2023-04-02 DIAGNOSIS — E785 Hyperlipidemia, unspecified: Secondary | ICD-10-CM | POA: Diagnosis not present

## 2023-04-02 DIAGNOSIS — N179 Acute kidney failure, unspecified: Secondary | ICD-10-CM | POA: Diagnosis not present

## 2023-04-02 DIAGNOSIS — Z87891 Personal history of nicotine dependence: Secondary | ICD-10-CM

## 2023-04-02 DIAGNOSIS — N400 Enlarged prostate without lower urinary tract symptoms: Secondary | ICD-10-CM | POA: Diagnosis present

## 2023-04-02 DIAGNOSIS — Z91030 Bee allergy status: Secondary | ICD-10-CM

## 2023-04-02 DIAGNOSIS — I5022 Chronic systolic (congestive) heart failure: Secondary | ICD-10-CM | POA: Diagnosis not present

## 2023-04-02 DIAGNOSIS — K5790 Diverticulosis of intestine, part unspecified, without perforation or abscess without bleeding: Secondary | ICD-10-CM | POA: Diagnosis present

## 2023-04-02 DIAGNOSIS — E1165 Type 2 diabetes mellitus with hyperglycemia: Secondary | ICD-10-CM | POA: Diagnosis not present

## 2023-04-02 DIAGNOSIS — I130 Hypertensive heart and chronic kidney disease with heart failure and stage 1 through stage 4 chronic kidney disease, or unspecified chronic kidney disease: Secondary | ICD-10-CM | POA: Diagnosis present

## 2023-04-02 DIAGNOSIS — E1122 Type 2 diabetes mellitus with diabetic chronic kidney disease: Secondary | ICD-10-CM | POA: Diagnosis present

## 2023-04-02 DIAGNOSIS — Z8249 Family history of ischemic heart disease and other diseases of the circulatory system: Secondary | ICD-10-CM

## 2023-04-02 DIAGNOSIS — N1832 Chronic kidney disease, stage 3b: Secondary | ICD-10-CM | POA: Diagnosis not present

## 2023-04-02 DIAGNOSIS — Z95810 Presence of automatic (implantable) cardiac defibrillator: Secondary | ICD-10-CM

## 2023-04-02 DIAGNOSIS — Z794 Long term (current) use of insulin: Secondary | ICD-10-CM | POA: Diagnosis not present

## 2023-04-02 DIAGNOSIS — Z8674 Personal history of sudden cardiac arrest: Secondary | ICD-10-CM

## 2023-04-02 DIAGNOSIS — B356 Tinea cruris: Secondary | ICD-10-CM | POA: Diagnosis not present

## 2023-04-02 DIAGNOSIS — L03314 Cellulitis of groin: Principal | ICD-10-CM | POA: Diagnosis present

## 2023-04-02 DIAGNOSIS — Z6841 Body Mass Index (BMI) 40.0 and over, adult: Secondary | ICD-10-CM | POA: Diagnosis not present

## 2023-04-02 DIAGNOSIS — J4489 Other specified chronic obstructive pulmonary disease: Secondary | ICD-10-CM | POA: Diagnosis present

## 2023-04-02 DIAGNOSIS — F419 Anxiety disorder, unspecified: Secondary | ICD-10-CM | POA: Diagnosis present

## 2023-04-02 DIAGNOSIS — Z87442 Personal history of urinary calculi: Secondary | ICD-10-CM

## 2023-04-02 DIAGNOSIS — Z833 Family history of diabetes mellitus: Secondary | ICD-10-CM

## 2023-04-02 DIAGNOSIS — E1169 Type 2 diabetes mellitus with other specified complication: Secondary | ICD-10-CM

## 2023-04-02 DIAGNOSIS — Z66 Do not resuscitate: Secondary | ICD-10-CM | POA: Diagnosis present

## 2023-04-02 DIAGNOSIS — Z7982 Long term (current) use of aspirin: Secondary | ICD-10-CM

## 2023-04-02 DIAGNOSIS — Z9049 Acquired absence of other specified parts of digestive tract: Secondary | ICD-10-CM

## 2023-04-02 DIAGNOSIS — L03311 Cellulitis of abdominal wall: Secondary | ICD-10-CM | POA: Diagnosis not present

## 2023-04-02 DIAGNOSIS — Z79899 Other long term (current) drug therapy: Secondary | ICD-10-CM

## 2023-04-02 DIAGNOSIS — Z8261 Family history of arthritis: Secondary | ICD-10-CM

## 2023-04-02 DIAGNOSIS — Z888 Allergy status to other drugs, medicaments and biological substances status: Secondary | ICD-10-CM

## 2023-04-02 LAB — CBC
HCT: 42.4 % (ref 39.0–52.0)
Hemoglobin: 14 g/dL (ref 13.0–17.0)
MCH: 31.3 pg (ref 26.0–34.0)
MCHC: 33 g/dL (ref 30.0–36.0)
MCV: 94.6 fL (ref 80.0–100.0)
Platelets: 262 10*3/uL (ref 150–400)
RBC: 4.48 MIL/uL (ref 4.22–5.81)
RDW: 14.5 % (ref 11.5–15.5)
WBC: 9.2 10*3/uL (ref 4.0–10.5)
nRBC: 0 % (ref 0.0–0.2)

## 2023-04-02 LAB — URINALYSIS, ROUTINE W REFLEX MICROSCOPIC
Bacteria, UA: NONE SEEN
Bilirubin Urine: NEGATIVE
Glucose, UA: >1000 mg/dL — AB
Hgb urine dipstick: NEGATIVE
Ketones, ur: NEGATIVE mg/dL
Leukocytes,Ua: NEGATIVE
Nitrite: NEGATIVE
Protein, ur: NEGATIVE mg/dL
Specific Gravity, Urine: 1.008 (ref 1.005–1.030)
pH: 5 (ref 5.0–8.0)

## 2023-04-02 LAB — BASIC METABOLIC PANEL
Anion gap: 11 (ref 5–15)
BUN: 44 mg/dL — ABNORMAL HIGH (ref 6–20)
CO2: 26 mmol/L (ref 22–32)
Calcium: 9.9 mg/dL (ref 8.9–10.3)
Chloride: 97 mmol/L — ABNORMAL LOW (ref 98–111)
Creatinine, Ser: 3.14 mg/dL — ABNORMAL HIGH (ref 0.61–1.24)
GFR, Estimated: 22 mL/min — ABNORMAL LOW (ref >60)
Glucose, Bld: 455 mg/dL — ABNORMAL HIGH (ref 70–99)
Potassium: 4 mmol/L (ref 3.5–5.1)
Sodium: 134 mmol/L — ABNORMAL LOW (ref 135–145)

## 2023-04-02 LAB — LACTIC ACID, PLASMA: Lactic Acid, Venous: 1.8 mmol/L (ref 0.5–1.9)

## 2023-04-02 LAB — CBG MONITORING, ED
Glucose-Capillary: 421 mg/dL — ABNORMAL HIGH (ref 70–99)
Glucose-Capillary: 230 mg/dL — ABNORMAL HIGH (ref 70–99)

## 2023-04-02 MED ORDER — VANCOMYCIN HCL IN DEXTROSE 1-5 GM/200ML-% IV SOLN
1000.0000 mg | Freq: Once | INTRAVENOUS | Status: AC
  Administered 2023-04-02: 1000 mg via INTRAVENOUS
  Filled 2023-04-02: qty 200

## 2023-04-02 MED ORDER — HYDROMORPHONE HCL 1 MG/ML IJ SOLN
1.0000 mg | Freq: Once | INTRAMUSCULAR | Status: AC
  Administered 2023-04-02: 1 mg via INTRAVENOUS
  Filled 2023-04-02: qty 1

## 2023-04-02 MED ORDER — PIPERACILLIN-TAZOBACTAM 3.375 G IVPB 30 MIN
3.3750 g | Freq: Once | INTRAVENOUS | Status: AC
  Administered 2023-04-02: 3.375 g via INTRAVENOUS
  Filled 2023-04-02: qty 50

## 2023-04-02 MED ORDER — VANCOMYCIN HCL IN DEXTROSE 1-5 GM/200ML-% IV SOLN: 1000.0000 mg | Freq: Once | INTRAVENOUS | Status: DC

## 2023-04-02 MED ORDER — CLINDAMYCIN PHOSPHATE 600 MG/50ML IV SOLN
600.0000 mg | Freq: Once | INTRAVENOUS | Status: AC
  Administered 2023-04-02: 600 mg via INTRAVENOUS
  Filled 2023-04-02: qty 50

## 2023-04-02 MED ORDER — ONDANSETRON HCL 4 MG/2ML IJ SOLN
4.0000 mg | Freq: Once | INTRAMUSCULAR | Status: AC
  Administered 2023-04-02: 4 mg via INTRAVENOUS
  Filled 2023-04-02: qty 2

## 2023-04-02 NOTE — ED Triage Notes (Signed)
When I asked pt how his blood sugars have been he states that he recently had to have a steroid shot and CBG has been elevated and he did not take his insulin today.  CBG 421 in triage.

## 2023-04-02 NOTE — Progress Notes (Deleted)
Electrophysiology Office Note:   Date:  04/02/2023  ID:  [name], DOB [birth year], MRN [id number]  Primary Cardiologist: Olga Millers, MD Electrophysiologist: Lanier Prude, MD  {Click to update primary MD,subspecialty MD or APP then REFRESH:1}    History of Present Illness:   [name] is a 57 y.o. male with h/o VT, chronic sCHF, NICM s/p ICD (RA lead revision 05/2022), DM II, CKD, COPD seen today for routine electrophysiology followup.   Last EP Clinic visit 08/2022 with stable device function. He was to re-establish with the advanced HF team. Remote device check 02/17/23 showed normal function, lead parameters stable per Dr. Lalla Brothers.   He was admitted to hospital from 9/20-9/24/24 with decompensated systolic CHF with increasing SOB and LE edema up to his abdomen. He required IV diuresis with good response. Hospital course complicated by AKI, hyperglycemia.   ER visit 03/17/23 for flank pain. Negative troponin. CT abd/pelvis negative for acute process.   Since last being seen in our clinic the patient reports doing ***.  he denies chest pain, palpitations, dyspnea, PND, orthopnea, nausea, vomiting, dizziness, syncope, edema, weight gain, or early satiety.   Review of systems complete and found to be negative unless listed in HPI.   EP Information / Studies Reviewed:    {EKGtoday:28818}      ICD Interrogation-  reviewed in detail today,  See PACEART report.  Device History: Abbott Dual Chamber ICD implanted RV 12/23/2013, RA 05/15/22 for sCHF, NICM History of appropriate therapy: Yes 2023 (electrolyte derangement) History of AAD therapy: Yes; currently on amiodarone    Studies:  [facility] 05/2022 > low pre & post capillary filling pressures, normal CO/CI, normal PVR & PA mean, no obstructive CAD, left dominant with large Lcx supplying multiple large marginals ECHO 08/2022 > LVEF 20-25%, GIDD   Arrhythmia / AAD VT > s/p ICD  Amiodarone     Risk  Assessment/Calculations:   {Does this patient have ATRIAL FIBRILLATION?[phone]} No BP recorded.  {Refresh Note OR Click here to enter BP  :1}***        Physical Exam:   VS:  There were no vitals taken for this visit.   Wt Readings from Last 3 Encounters:  03/16/23 292 lb (132.5 kg)  03/03/23 295 lb 12.8 oz (134.2 kg)  02/23/23 (!) 306 lb 9.6 oz (139.1 kg)     GEN: Well nourished, well developed in no acute distress NECK: No JVD; No carotid bruits CARDIAC: {EPRHYTHM:28826}, no murmurs, rubs, gallops RESPIRATORY:  Clear to auscultation without rales, wheezing or rhonchi  ABDOMEN: Soft, non-tender, non-distended EXTREMITIES:  No edema; No deformity   ASSESSMENT AND PLAN:    Chronic Systolic Dysfunction / NICM s/p Abbott dual chamber ICD  LVEF 20%  -euvolemic today -Stable on an appropriate medical regimen -Normal ICD function -See Pace Art report -No changes today -GDMT per advanced HF team   Hx VT  -continue amiodarone  -update labs > CBC, TSH, free T4   Suspected OSA  -sleep referral   CKD IIIb -per primary    Disposition:   Follow up with {EPPROVIDERS:28135} {EPFOLLOW VH:84696}   Signed, Canary Brim, MSN, APRN, NP-C, AGACNP-BC [facility] - Electrophysiology  04/02/2023, 2:38 PM

## 2023-04-02 NOTE — Progress Notes (Signed)
Plan of Care Note for accepted transfer   Patient: [name] MRN: [id number]   DOA: 04/02/2023  Facility requesting transfer: [facility] Requesting Provider: Dr. Rush Landmark Reason for transfer: Cellulitis of Perineum Facility course: 57 yo M with uncontrolled DM2, CKD.  Patient in to ED with perineal cellulitis, per EDP the cellulitis looks quite concerning on exam; however, no fourniers findings on CT scan and also no SIRS in ED so doesn't appear to be Fournier's at this time.  Regardless, due to risk factors, location, and exam appearance he was aggressively treated with NSTI spectrum Abx in ED.  Putting in for progressive bed for IV insulin for CBG of 455.  Receiving provider: may or may not want to have surgery take a look at him as well, though with a negative CT scan and 0/4 SIRS, I'm thinking they are gonna also say wait and watch closely for the moment.  Plan of care: The patient is accepted for admission to Progressive unit, at [facility]..   Author: Hillary Bow., DO 04/02/2023  Check [email or web address] for on-call coverage.  Nursing staff, Please call [facility] Admits & Consults System-Wide number on Amion as soon as patient's arrival, so appropriate admitting provider can evaluate the pt.

## 2023-04-02 NOTE — ED Notes (Signed)
ED Provider at bedside. 

## 2023-04-02 NOTE — ED Provider Notes (Signed)
[place] EMERGENCY DEPARTMENT AT [facility] Provider Note   CSN: [id number] Arrival date & time: 04/02/23  1526     History  Chief Complaint  Patient presents with   Rash    Jock itch   Hyperglycemia    [name] is a 57 y.o. male.  The history is provided by the patient and medical records. No language interpreter was used.  Rash Location:  Pelvis Pelvic rash location:  Groin, pelvis, scrotum and perineum Quality: itchiness, painful and redness   Pain details:    Quality:  Aching   Severity:  Severe   Onset quality:  Gradual   Duration:  4 days   Timing:  Constant   Progression:  Worsening Severity:  Severe Associated symptoms: fatigue   Associated symptoms: no abdominal pain, no diarrhea, no fever, no headaches, no nausea, no shortness of breath, not vomiting and not wheezing   Hyperglycemia Associated symptoms: fatigue   Associated symptoms: no abdominal pain, no chest pain, no dysuria, no fever, no nausea, no shortness of breath and no vomiting        Home Medications Prior to Admission medications   Medication Sig Start Date End Date Taking? Authorizing Provider  Accu-Chek Softclix Lancets lancets Use as instructed 02/01/22   Lonell Grandchild, MD  acetaminophen (TYLENOL) 500 MG tablet Take 1,000 mg by mouth 2 (two) times daily.    [provider]  albuterol (VENTOLIN HFA) 108 (90 Base) MCG/ACT inhaler Inhale 2 puffs into the lungs every 6 (six) hours as needed for wheezing or shortness of breath. 10/24/22   Glenford Bayley, NP  amiodarone (PACERONE) 200 MG tablet Take 1 tablet (200 mg total) by mouth daily. 08/15/22   Sheilah Pigeon, PA-C  aspirin 81 MG tablet Take 81 mg by mouth daily.    [provider]  atorvastatin (LIPITOR) 10 MG tablet Take 1 tablet (10 mg total) by mouth daily. 01/27/23   Marcine Matar, MD  Blood Glucose Monitoring Suppl (ACCU-CHEK GUIDE) w/Device KIT UAD 02/01/22   Lonell Grandchild, MD   Blood Glucose Monitoring Suppl DEVI 1 each by Does not apply route in the morning, at noon, and at bedtime. May substitute to any manufacturer covered by patient's insurance. 12/23/22   Carlus Pavlov, MD  cetirizine (EQ ALLERGY RELIEF, CETIRIZINE,) 10 MG tablet Take 1 tablet (10 mg total) by mouth daily. 10/30/22   Hetty Blend, FNP  cholecalciferol (VITAMIN D3) 25 MCG (1000 UNIT) tablet Take 1,000 Units by mouth daily.    [provider]  clobetasol cream (TEMOVATE) 0.05 % Apply 1 Application topically as needed.    [provider]  clonazePAM (KLONOPIN) 0.5 MG tablet Take 0.5 tablets (0.25 mg total) by mouth daily AND 1 tablet (0.5 mg total) at bedtime. 03/17/23 03/16/24  Stasia Cavalier, MD  Continuous Glucose Receiver (FREESTYLE LIBRE 2 READER) DEVI USE AS DIRECTED 03/06/23   Marcine Matar, MD  Continuous Glucose Sensor (FREESTYLE LIBRE 2 SENSOR) MISC Use to check blood sugar TID. Change sensor once every 14 days. E11.65 03/06/23   Hoy Register, MD  dapagliflozin propanediol (FARXIGA) 10 MG TABS tablet Take 1 tablet (10 mg total) by mouth daily before breakfast. 05/26/22   [name], Aditya, DO  EPINEPHrine 0.3 mg/0.3 mL IJ SOAJ injection Inject 0.3 mg into the muscle as needed. 01/30/22   Alfonse Spruce, MD  famotidine (PEPCID) 20 MG tablet TAKE 1 TABLET BY MOUTH 1 TO 2 TIMES DAILY AS  NEEDED 01/26/23   Ambs, Norvel Richards, FNP  fluticasone (FLONASE) 50 MCG/ACT nasal spray Use 1-2 sprays in each nostril once a day as needed for nasal congestion. 01/30/22   Alfonse Spruce, MD  Fluticasone-Umeclidin-Vilant (TRELEGY ELLIPTA IN) Inhale 1 puff into the lungs daily.    [provider]  gabapentin (NEURONTIN) 300 MG capsule Take 1 capsule by mouth at bedtime 10/15/22   Marcine Matar, MD  glucose blood (ACCU-CHEK GUIDE) test strip Use as instructed 02/01/22   Lonell Grandchild, MD  glucose blood (FREESTYLE LITE) test strip Use as instructed.  Please make sure this  is compatible with [facility] 2 reader. 07/14/22   Marcine Matar, MD  insulin aspart (NOVOLOG) 100 UNIT/ML injection Inject 24 Units into the skin 3 (three) times daily with meals.    [provider]  insulin glargine (LANTUS SOLOSTAR) 100 UNIT/ML Solostar Pen Inject 36 Units into the skin daily. 12/12/22   Carlus Pavlov, MD  Insulin Pen Needle (B-D ULTRAFINE III SHORT PEN) 31G X 8 MM MISC USE AS DIRECTED 03/10/23   Marcine Matar, MD  ipratropium-albuterol (DUONEB) 0.5-2.5 (3) MG/3ML SOLN Take 3 mLs by nebulization every 6 (six) hours as needed. 03/21/22   Lupita Leash, MD  ketoconazole (NIZORAL) 2 % cream Apply 1 Application topically daily. 03/12/23   Standiford, Jenelle Mages, DPM  Lancet Devices (ACCU-CHEK SOFTCLIX) lancets 1 each by Other route 3 (three) times daily. 10/20/14   Funches, Gerilyn Nestle, MD  methocarbamol (ROBAXIN) 500 MG tablet Take 500 mg by mouth daily.    [provider]  metoprolol succinate (TOPROL-XL) 50 MG 24 hr tablet Take 1 tablet (50 mg total) by mouth daily. 08/22/22   [name], Aditya, DO  miconazole (MICATIN) 2 % cream Apply to affected area twice a day 03/31/23   Marcine Matar, MD  sacubitril-valsartan (ENTRESTO) 24-26 MG Take 1 tablet by mouth 2 (two) times daily. 01/07/23   [name], Aditya, DO  sertraline (ZOLOFT) 50 MG tablet Take 1 tablet (50 mg total) by mouth daily. 03/17/23 03/16/24  Stasia Cavalier, MD  spironolactone (ALDACTONE) 25 MG tablet Take 0.5 tablets (12.5 mg total) by mouth daily. 05/26/22   [name], Aditya, DO  tamsulosin (FLOMAX) 0.4 MG CAPS capsule Take 2 capsules (0.8 mg total) by mouth daily. 01/27/23   Marcine Matar, MD  torsemide (DEMADEX) 20 MG tablet Take 4 tablets (80 mg total) by mouth daily. 02/23/23   [name], Eliezer Lofts, DO      Allergies    Bee venom and Trulicity [dulaglutide]    Review of Systems   Review of Systems  Constitutional:  Positive for fatigue. Negative for chills and fever.  HENT:   Negative for congestion.   Respiratory:  Negative for cough, chest tightness, shortness of breath and wheezing.   Cardiovascular:  Negative for chest pain.  Gastrointestinal:  Negative for abdominal pain, constipation, diarrhea, nausea and vomiting.  Genitourinary:  Positive for genital sores, scrotal swelling and testicular pain. Negative for dysuria and flank pain.  Musculoskeletal:  Negative for back pain, neck pain and neck stiffness.  Skin:  Positive for rash.  Neurological:  Negative for headaches.  Psychiatric/Behavioral:  Negative for agitation.   All other systems reviewed and are negative.   Physical Exam Updated Vital Signs BP (!) 137/96   Pulse 69   Temp 98.6 F (37 C) (Oral)   Resp 16   SpO2 96%  Physical Exam Exam conducted with a chaperone present.  Constitutional:  General: He is not in acute distress.    Appearance: He is well-developed.  HENT:     Head: Normocephalic and atraumatic.     Right Ear: External ear normal.     Left Ear: External ear normal.     Nose: Nose normal.     Mouth/Throat:     Mouth: Mucous membranes are moist.     Pharynx: No oropharyngeal exudate or posterior oropharyngeal erythema.  Eyes:     Conjunctiva/sclera: Conjunctivae normal.     Pupils: Pupils are equal, round, and reactive to light.  Cardiovascular:     Rate and Rhythm: Normal rate.     Pulses: Normal pulses.  Pulmonary:     Effort: Pulmonary effort is normal. No respiratory distress.     Breath sounds: No stridor. No wheezing, rhonchi or rales.  Chest:     Chest wall: No tenderness.  Abdominal:     General: Abdomen is flat.     Palpations: Abdomen is soft.     Tenderness: There is no abdominal tenderness. There is no right CVA tenderness, left CVA tenderness, guarding or rebound.  Genitourinary:    Penis: Normal. No tenderness or discharge.      Testes:        Right: Tenderness present.        Left: Tenderness present.     Comments: Chaperone present.  Patient  has erythema warmth and tenderness and swelling in the scrotum, bilateral inner thighs, and lower groin area.  Spares the penis.  No crepitance initially but has severe tenderness in the inguinal areas.  Clinically somewhat concerning for Fournier's gangrene developing. Musculoskeletal:        General: Tenderness present.     Cervical back: Normal range of motion and neck supple.  Skin:    General: Skin is warm.     Findings: Erythema and rash present.  Neurological:     General: No focal deficit present.     Mental Status: He is alert and oriented to person, place, and time.     Sensory: No sensory deficit.     Motor: No weakness or abnormal muscle tone.     ED Results / Procedures / Treatments   Labs (all labs ordered are listed, but only abnormal results are displayed) Labs Reviewed  BASIC METABOLIC PANEL - Abnormal; Notable for the following components:      Result Value   Sodium 134 (*)    Chloride 97 (*)    Glucose, Bld 455 (*)    BUN 44 (*)    Creatinine, Ser 3.14 (*)    GFR, Estimated 22 (*)    All other components within normal limits  URINALYSIS, ROUTINE W REFLEX MICROSCOPIC - Abnormal; Notable for the following components:   Color, Urine COLORLESS (*)    Glucose, UA >1,000 (*)    All other components within normal limits  CBG MONITORING, ED - Abnormal; Notable for the following components:   Glucose-Capillary 421 (*)    All other components within normal limits  CBG MONITORING, ED - Abnormal; Notable for the following components:   Glucose-Capillary 230 (*)    All other components within normal limits  CULTURE, BLOOD (ROUTINE X 2)  CULTURE, BLOOD (ROUTINE X 2)  CBC  LACTIC ACID, PLASMA  CBG MONITORING, ED    EKG None  Radiology CT ABDOMEN PELVIS WO CONTRAST  Result Date: 04/02/2023 CLINICAL DATA:  Possible Fournier's gangrene of the scrotum, initial encounter EXAM: CT ABDOMEN AND PELVIS WITHOUT CONTRAST  TECHNIQUE: Multidetector CT imaging of the abdomen  and pelvis was performed following the standard protocol without IV contrast. RADIATION DOSE REDUCTION: This exam was performed according to the departmental dose-optimization program which includes automated exposure control, adjustment of the mA and/or kV according to patient size and/or use of iterative reconstruction technique. COMPARISON:  03/17/2023 FINDINGS: Lower chest: No acute abnormality. Hepatobiliary: No focal liver abnormality is seen. Status post cholecystectomy. No biliary dilatation. Pancreas: Unremarkable. No pancreatic ductal dilatation or surrounding inflammatory changes. Spleen: Normal in size without focal abnormality. Adrenals/Urinary Tract: Adrenal glands show no bilateral adrenal nodule stable in appearance from the prior exam. These are unchanged from 2019 and consistent with benign adenomas. No further follow-up is recommended. Kidneys are well visualized bilaterally. No renal calculi or obstructive changes are seen. The bladder is within normal limits. Stomach/Bowel: Scattered diverticular change of the colon is noted. No evidence of diverticulitis is seen. The appendix is within normal limits. Small bowel and stomach are unremarkable. Vascular/Lymphatic: Aortic atherosclerosis. No enlarged abdominal or pelvic lymph nodes. Reproductive: Prostate is within normal limits. Other: Minimal edematous changes are noted in the perineum eccentric to the right best seen on the coronal images number 77 through 65 of series 5. No focal abscess is seen. No subcutaneous air is noted. No scrotal edema is seen. Musculoskeletal: No acute or significant osseous findings. IMPRESSION: Minimal edematous changes in the perineum extrinsic to the right consistent with the given clinical history. No subcutaneous air or abscess is noted to suggest Fournier's gangrene. No definitive skin breakdown is noted. Stable adrenal lesions bilaterally dating back to 2019. Consistent with adenomas and no further follow-up is  recommended. Diverticulosis without diverticulitis. Electronically Signed   By: Alcide Clever M.D.   On: 04/02/2023 20:09    Procedures Procedures    CRITICAL CARE Performed by: Canary Brim Lennix Kneisel Total critical care time: 30 minutes Critical care time was exclusive of separately billable procedures and treating other patients. Critical care was necessary to treat or prevent imminent or life-threatening deterioration. Critical care was time spent personally by me on the following activities: development of treatment plan with patient and/or surrogate as well as nursing, discussions with consultants, evaluation of patient's response to treatment, examination of patient, obtaining history from patient or surrogate, ordering and performing treatments and interventions, ordering and review of laboratory studies, ordering and review of radiographic studies, pulse oximetry and re-evaluation of patient's condition.   Medications Ordered in ED Medications - No data to display  ED Course/ Medical Decision Making/ A&P                                 Medical Decision Making Amount and/or Complexity of Data Reviewed Labs: ordered. Radiology: ordered.  Risk Prescription drug management. Decision regarding hospitalization.    [name] is a 57 y.o. male with a past medical history significant for CHF with ICD and previous cardiac arrest, COPD, diabetes, kidney stones, GERD, and obesity who presents for groin infection.  According to patient, about 4 days ago he started having redness and pain in his groin.  It is since spread and is covering his entire scrotum and his groin area and upper thigh areas.  He thought it was just jock itch so tried using alcohol and then tried using a antifungal foot cream without success.  He reports it is 10 out of 10 pain and it is spreading and it is making  him feel ill.  He reports no fevers chills or cough and denies significant nausea vomiting,  constipation, diarrhea, or urinary changes but he reports this is significantly worsening in his groin.  He reports his sugars have been high because he had a steroid shot the other day.  He denies any trauma.  He is worried about this infection.  On exam, lungs clear.  Chest nontender.  Abdomen nontender in the upper and mid abdomen but was tender in the groin area.  A chaperone was present and he has erythema warmth and tenderness throughout his scrotum, bilateral upper inner thighs, and some redness towards the inguinal areas.  The penis itself was not tender.  No large ulcer seen but there is some possible oozing.  Patient reports there was some purulence at times.  Clinically I am somewhat concerned about early Fournier's gangrene versus cellulitis.  He will get screening labs including a lactic acid and blood cultures.  Will order antibiotics to treat possible Fournier's and will order a CT scan.  Unfortunately his creatinine is elevated and his GFR is less than 30 so we have a noncontrasted CT do not hurt his kidneys at this time.  Once imaging and labs are completed, to speed admission for IV antibiotics of cellulitis versus early Fournier's gangrene.  8:40 PM CT scan does not show evidence of abscess or subcutaneous gas.  I do suspect cellulitis of the groin and given his diabetes, elevated glucoses, and discomfort.  I feel he needs admission for the IV antibiotics.  After the pain medicine his pain improved but his oxygen saturations dipped into the 80s.  He is now on oxygen.  Will call for medical admission for the groin cellulitis.         Final Clinical Impression(s) / ED Diagnoses Final diagnoses:  Cellulitis of groin     Clinical Impression: 1. Cellulitis of groin     Disposition: Admit  This note was prepared with assistance of Dragon voice recognition software. Occasional wrong-word or sound-a-like substitutions may have occurred due to the inherent limitations of  voice recognition software.     Brentton Wardlow, Canary Brim, MD 04/02/23 2150

## 2023-04-02 NOTE — ED Notes (Signed)
Patient transported to CT 

## 2023-04-02 NOTE — ED Triage Notes (Signed)
Pt has been having increasing discomfort and itching and burning from jock itch.  Pt has attempted to treat this at home with alcohol, witch hahzel and cornstarch and then he did apply a cream he had for athletes foot.

## 2023-04-03 ENCOUNTER — Ambulatory Visit: Payer: Medicaid Other | Attending: Cardiology | Admitting: Physician Assistant

## 2023-04-03 DIAGNOSIS — L03315 Cellulitis of perineum: Secondary | ICD-10-CM | POA: Diagnosis not present

## 2023-04-03 DIAGNOSIS — I428 Other cardiomyopathies: Secondary | ICD-10-CM

## 2023-04-03 DIAGNOSIS — N1832 Chronic kidney disease, stage 3b: Secondary | ICD-10-CM

## 2023-04-03 DIAGNOSIS — Z95810 Presence of automatic (implantable) cardiac defibrillator: Secondary | ICD-10-CM

## 2023-04-03 DIAGNOSIS — I472 Ventricular tachycardia, unspecified: Secondary | ICD-10-CM

## 2023-04-03 DIAGNOSIS — I5022 Chronic systolic (congestive) heart failure: Secondary | ICD-10-CM

## 2023-04-03 LAB — CBC WITH DIFFERENTIAL/PLATELET
Abs Immature Granulocytes: 0.06 10*3/uL (ref 0.00–0.07)
Basophils Absolute: 0 10*3/uL (ref 0.0–0.1)
Basophils Relative: 1 %
Eosinophils Absolute: 0.1 10*3/uL (ref 0.0–0.5)
Eosinophils Relative: 2 %
HCT: 41.5 % (ref 39.0–52.0)
Hemoglobin: 13.6 g/dL (ref 13.0–17.0)
Immature Granulocytes: 1 %
Lymphocytes Relative: 30 %
Lymphs Abs: 1.9 10*3/uL (ref 0.7–4.0)
MCH: 31.3 pg (ref 26.0–34.0)
MCHC: 32.8 g/dL (ref 30.0–36.0)
MCV: 95.4 fL (ref 80.0–100.0)
Monocytes Absolute: 0.6 10*3/uL (ref 0.1–1.0)
Monocytes Relative: 10 %
Neutro Abs: 3.6 10*3/uL (ref 1.7–7.7)
Neutrophils Relative %: 56 %
Platelets: 235 10*3/uL (ref 150–400)
RBC: 4.35 MIL/uL (ref 4.22–5.81)
RDW: 14.5 % (ref 11.5–15.5)
WBC: 6.3 10*3/uL (ref 4.0–10.5)
nRBC: 0.3 % — ABNORMAL HIGH (ref 0.0–0.2)

## 2023-04-03 LAB — COMPREHENSIVE METABOLIC PANEL
ALT: 24 U/L (ref 0–44)
AST: 18 U/L (ref 15–41)
Albumin: 4.1 g/dL (ref 3.5–5.0)
Alkaline Phosphatase: 64 U/L (ref 38–126)
Anion gap: 9 (ref 5–15)
BUN: 41 mg/dL — ABNORMAL HIGH (ref 6–20)
CO2: 27 mmol/L (ref 22–32)
Calcium: 9.2 mg/dL (ref 8.9–10.3)
Chloride: 105 mmol/L (ref 98–111)
Creatinine, Ser: 2.65 mg/dL — ABNORMAL HIGH (ref 0.61–1.24)
GFR, Estimated: 27 mL/min — ABNORMAL LOW (ref >60)
Glucose, Bld: 148 mg/dL — ABNORMAL HIGH (ref 70–99)
Potassium: 4 mmol/L (ref 3.5–5.1)
Sodium: 141 mmol/L (ref 135–145)
Total Bilirubin: 0.5 mg/dL (ref 0.3–1.2)
Total Protein: 7.3 g/dL (ref 6.5–8.1)

## 2023-04-03 LAB — GLUCOSE, CAPILLARY
Glucose-Capillary: 110 mg/dL — ABNORMAL HIGH (ref 70–99)
Glucose-Capillary: 248 mg/dL — ABNORMAL HIGH (ref 70–99)
Glucose-Capillary: 143 mg/dL — ABNORMAL HIGH (ref 70–99)

## 2023-04-03 LAB — CBG MONITORING, ED
Glucose-Capillary: 317 mg/dL — ABNORMAL HIGH (ref 70–99)
Glucose-Capillary: 149 mg/dL — ABNORMAL HIGH (ref 70–99)

## 2023-04-03 MED ORDER — TAMSULOSIN HCL 0.4 MG PO CAPS
0.8000 mg | ORAL_CAPSULE | Freq: Every day | ORAL | Status: DC
  Administered 2023-04-03 – 2023-04-07 (×4): 0.8 mg via ORAL
  Filled 2023-04-03 (×5): qty 2

## 2023-04-03 MED ORDER — ONDANSETRON HCL 4 MG PO TABS: 4.0000 mg | ORAL_TABLET | Freq: Four times a day (QID) | ORAL | Status: DC | PRN

## 2023-04-03 MED ORDER — ASPIRIN 81 MG PO TBEC
81.0000 mg | DELAYED_RELEASE_TABLET | Freq: Every day | ORAL | Status: DC
  Administered 2023-04-03 – 2023-04-07 (×5): 81 mg via ORAL
  Filled 2023-04-03 (×5): qty 1

## 2023-04-03 MED ORDER — ATORVASTATIN CALCIUM 10 MG PO TABS
10.0000 mg | ORAL_TABLET | Freq: Every day | ORAL | Status: DC
  Administered 2023-04-03 – 2023-04-07 (×5): 10 mg via ORAL
  Filled 2023-04-03 (×5): qty 1

## 2023-04-03 MED ORDER — CLOTRIMAZOLE 1 % EX CREA
TOPICAL_CREAM | Freq: Two times a day (BID) | CUTANEOUS | Status: DC
  Filled 2023-04-03: qty 15

## 2023-04-03 MED ORDER — METOPROLOL SUCCINATE ER 50 MG PO TB24
50.0000 mg | ORAL_TABLET | Freq: Every day | ORAL | Status: DC
  Administered 2023-04-04 – 2023-04-07 (×4): 50 mg via ORAL
  Filled 2023-04-03 (×4): qty 1

## 2023-04-03 MED ORDER — UMECLIDINIUM BROMIDE 62.5 MCG/ACT IN AEPB
1.0000 | INHALATION_SPRAY | Freq: Every day | RESPIRATORY_TRACT | Status: DC
Start: 2023-04-03 — End: 2023-04-07
  Administered 2023-04-04 – 2023-04-07 (×4): 1 via RESPIRATORY_TRACT
  Filled 2023-04-03: qty 7

## 2023-04-03 MED ORDER — IPRATROPIUM-ALBUTEROL 0.5-2.5 (3) MG/3ML IN SOLN: 3.0000 mL | Freq: Four times a day (QID) | RESPIRATORY_TRACT | Status: DC | PRN

## 2023-04-03 MED ORDER — INSULIN ASPART 100 UNIT/ML IJ SOLN: 0.0000 [IU] | Freq: Every day | INTRAMUSCULAR | Status: DC

## 2023-04-03 MED ORDER — INSULIN GLARGINE-YFGN 100 UNIT/ML ~~LOC~~ SOLN
28.0000 [IU] | Freq: Every day | SUBCUTANEOUS | Status: DC
  Filled 2023-04-03 (×2): qty 0.28

## 2023-04-03 MED ORDER — SERTRALINE HCL 50 MG PO TABS
50.0000 mg | ORAL_TABLET | Freq: Every day | ORAL | Status: DC
  Administered 2023-04-03 – 2023-04-07 (×5): 50 mg via ORAL
  Filled 2023-04-03 (×5): qty 1

## 2023-04-03 MED ORDER — INSULIN GLARGINE-YFGN 100 UNIT/ML ~~LOC~~ SOLN
36.0000 [IU] | Freq: Every day | SUBCUTANEOUS | Status: DC
  Administered 2023-04-03: 36 [IU] via SUBCUTANEOUS
  Filled 2023-04-03: qty 360
  Filled 2023-04-03: qty 0.36

## 2023-04-03 MED ORDER — ACETAMINOPHEN 500 MG PO TABS
1000.0000 mg | ORAL_TABLET | Freq: Two times a day (BID) | ORAL | Status: DC
  Administered 2023-04-03 – 2023-04-07 (×8): 1000 mg via ORAL
  Filled 2023-04-03 (×8): qty 2

## 2023-04-03 MED ORDER — ENOXAPARIN SODIUM 80 MG/0.8ML IJ SOSY
65.0000 mg | PREFILLED_SYRINGE | INTRAMUSCULAR | Status: DC
  Administered 2023-04-03: 65 mg via SUBCUTANEOUS
  Filled 2023-04-03: qty 0.8

## 2023-04-03 MED ORDER — ONDANSETRON HCL 4 MG/2ML IJ SOLN: 4.0000 mg | Freq: Four times a day (QID) | INTRAMUSCULAR | Status: DC | PRN

## 2023-04-03 MED ORDER — FLUTICASONE FUROATE-VILANTEROL 200-25 MCG/ACT IN AEPB
1.0000 | INHALATION_SPRAY | Freq: Every day | RESPIRATORY_TRACT | Status: DC
  Administered 2023-04-04 – 2023-04-07 (×4): 1 via RESPIRATORY_TRACT
  Filled 2023-04-03: qty 28

## 2023-04-03 MED ORDER — AMIODARONE HCL 200 MG PO TABS
200.0000 mg | ORAL_TABLET | Freq: Every day | ORAL | Status: DC
  Administered 2023-04-03 – 2023-04-07 (×5): 200 mg via ORAL
  Filled 2023-04-03 (×5): qty 1

## 2023-04-03 MED ORDER — ACETAMINOPHEN 650 MG RE SUPP: 650.0000 mg | Freq: Four times a day (QID) | RECTAL | Status: DC | PRN

## 2023-04-03 MED ORDER — SODIUM CHLORIDE 0.9 % IV SOLN: 2.0000 g | Freq: Once | INTRAVENOUS | Status: DC

## 2023-04-03 MED ORDER — INSULIN ASPART 100 UNIT/ML IJ SOLN: 0.0000 [IU] | Freq: Three times a day (TID) | INTRAMUSCULAR | Status: DC

## 2023-04-03 MED ORDER — VANCOMYCIN HCL 1750 MG/350ML IV SOLN
1750.0000 mg | INTRAVENOUS | Status: DC
  Administered 2023-04-04: 1750 mg via INTRAVENOUS
  Filled 2023-04-03 (×2): qty 350

## 2023-04-03 MED ORDER — FLUCONAZOLE 150 MG PO TABS
150.0000 mg | ORAL_TABLET | Freq: Once | ORAL | Status: AC
  Administered 2023-04-03: 150 mg via ORAL
  Filled 2023-04-03: qty 1

## 2023-04-03 MED ORDER — VANCOMYCIN HCL IN DEXTROSE 1-5 GM/200ML-% IV SOLN: 1000.0000 mg | Freq: Once | INTRAVENOUS | Status: DC

## 2023-04-03 MED ORDER — INSULIN ASPART 100 UNIT/ML IJ SOLN
0.0000 [IU] | Freq: Every day | INTRAMUSCULAR | Status: DC
  Administered 2023-04-04 – 2023-04-06 (×3): 2 [IU] via SUBCUTANEOUS

## 2023-04-03 MED ORDER — PIPERACILLIN-TAZOBACTAM 3.375 G IVPB
3.3750 g | Freq: Three times a day (TID) | INTRAVENOUS | Status: DC
  Administered 2023-04-03 – 2023-04-06 (×9): 3.375 g via INTRAVENOUS
  Filled 2023-04-03 (×8): qty 50

## 2023-04-03 MED ORDER — CLONAZEPAM 0.5 MG PO TABS
0.5000 mg | ORAL_TABLET | Freq: Two times a day (BID) | ORAL | Status: DC
  Administered 2023-04-03 – 2023-04-07 (×8): 0.5 mg via ORAL
  Filled 2023-04-03 (×8): qty 1

## 2023-04-03 MED ORDER — INSULIN ASPART 100 UNIT/ML IJ SOLN
0.0000 [IU] | Freq: Three times a day (TID) | INTRAMUSCULAR | Status: DC
  Administered 2023-04-03: 3 [IU] via SUBCUTANEOUS
  Administered 2023-04-04: 9 [IU] via SUBCUTANEOUS
  Administered 2023-04-04 – 2023-04-06 (×6): 2 [IU] via SUBCUTANEOUS
  Administered 2023-04-06: 5 [IU] via SUBCUTANEOUS
  Administered 2023-04-06: 3 [IU] via SUBCUTANEOUS
  Administered 2023-04-07: 2 [IU] via SUBCUTANEOUS

## 2023-04-03 MED ORDER — ACETAMINOPHEN 325 MG PO TABS
650.0000 mg | ORAL_TABLET | Freq: Four times a day (QID) | ORAL | Status: DC | PRN
  Administered 2023-04-03: 650 mg via ORAL

## 2023-04-03 MED ORDER — INSULIN ASPART PROT & ASPART (70-30 MIX) 100 UNIT/ML ~~LOC~~ SUSP
24.0000 [IU] | Freq: Two times a day (BID) | SUBCUTANEOUS | Status: DC
  Administered 2023-04-03 (×2): 24 [IU] via SUBCUTANEOUS
  Filled 2023-04-03 (×2): qty 24

## 2023-04-03 MED ORDER — FLUTICASONE-UMECLIDIN-VILANT 100-62.5-25 MCG/ACT IN AEPB: INHALATION_SPRAY | Freq: Every day | RESPIRATORY_TRACT | Status: DC

## 2023-04-03 MED ORDER — METHOCARBAMOL 500 MG PO TABS
500.0000 mg | ORAL_TABLET | Freq: Every day | ORAL | Status: DC
  Administered 2023-04-03 – 2023-04-07 (×5): 500 mg via ORAL
  Filled 2023-04-03 (×5): qty 1

## 2023-04-03 MED ORDER — GABAPENTIN 300 MG PO CAPS
300.0000 mg | ORAL_CAPSULE | Freq: Every day | ORAL | Status: DC
  Administered 2023-04-03 – 2023-04-06 (×4): 300 mg via ORAL
  Filled 2023-04-03 (×4): qty 1

## 2023-04-03 MED ORDER — INSULIN ASPART 100 UNIT/ML IJ SOLN
4.0000 [IU] | Freq: Three times a day (TID) | INTRAMUSCULAR | Status: DC
  Administered 2023-04-03 – 2023-04-07 (×11): 4 [IU] via SUBCUTANEOUS

## 2023-04-03 NOTE — ED Notes (Signed)
Pt checked BS on self-monitor. BS 201. Pt did not want 24 units of Novolog 70/30. Spoke with MD and okay to give 16 units. Upon return after patient ate and went to bathroom, BS was 242 and patient was okay to receive 24 units. Documented in [facility]. No c/o pain or complications.

## 2023-04-03 NOTE — Progress Notes (Signed)
   04/03/23 1515  TOC Brief Assessment  Insurance and Status Reviewed (Medicaid Prepaid Health Plan)  Patient has primary care physician Yes Laural Benes, Binnie Rail, MD)  Home environment has been reviewed from Home with Brother  Prior level of function: Independent  Prior/Current Home Services No current home services  Social Determinants of Health Reivew SDOH reviewed no interventions necessary  Readmission risk has been reviewed Yes (N/A Listed)  Transition of care needs no transition of care needs at this time   [facility] will continue to follow patient for any additional discharge needs

## 2023-04-03 NOTE — Inpatient Diabetes Management (Signed)
Inpatient Diabetes Program Recommendations  AACE/ADA: New Consensus Statement on Inpatient Glycemic Control (2015)  Target Ranges:  Prepandial:   less than 140 mg/dL      Peak postprandial:   less than 180 mg/dL (1-2 hours)      Critically ill patients:  140 - 180 mg/dL   Lab Results  Component Value Date   GLUCAP 149 (H) 04/03/2023   HGBA1C 9.1 (A) 02/13/2023    Latest Reference Range & Units 04/02/23 15:48 04/02/23 20:05 04/03/23 00:57 04/03/23 11:24  Glucose-Capillary 70 - 99 mg/dL 563 (H) 875 (H) 643 (H) 149 (H)  (H): Data is abnormally high  Review of Glycemic Control  Diabetes history: DM2 Outpatient Diabetes medications: Lantus 36 units, Novolog 24 units tid meal coverage, Farxiga 10 mg daily Current orders for Inpatient glycemic control: Semglee 36 units hs, 70/30 24 units bid, 0-9 tid, 0-5 hs  Inpatient Diabetes Program Recommendations:   Noted patient on Semglee and 70/30 insulin regimen and NPO. -D/C 70/30 insulin -Change Novolog 0-9 to q 4 hrs. While NPO -Add Novolog meal coverage when eating  Thank you, [name]. Ife Vitelli, RN, MSN, CDCES  Diabetes Coordinator Inpatient Glycemic Control Team Team Pager [phone] (8am-5pm) 04/03/2023 1:57 PM

## 2023-04-03 NOTE — Plan of Care (Signed)
Care plan documented.

## 2023-04-03 NOTE — Progress Notes (Signed)
Patient refused Insulin Semglee insulin despite education. MD notified.

## 2023-04-03 NOTE — ED Notes (Signed)
Pt offered barrier ointment for skin discomfort/irritation, declined, warm blankets and water given.

## 2023-04-03 NOTE — Progress Notes (Signed)
Patient complained of pain in the groin area 7 out of 10. Declines pain medication despite education. MD notified

## 2023-04-03 NOTE — ED Notes (Signed)
Called Bed Placement; awaiting dc for bed assignment

## 2023-04-03 NOTE — ED Notes (Signed)
Called [facility] for transport, bed assignment ready

## 2023-04-03 NOTE — ED Notes (Addendum)
Reports taking  Lantus 36Units nightly Novolog 24 Units 2x daily.

## 2023-04-03 NOTE — H&P (Signed)
History and Physical    [name] XBM:[id number] DOB: [birth year] DOA: 04/02/2023  I have briefly reviewed the patient's prior medical records in [facility]  PCP: Marcine Matar, MD  Patient coming from: home  Chief Complaint: groin pain  HPI: [name] is a 57 y.o. male with medical history significant of type 2 diabetes mellitus, nonischemic cardiomyopathy with chronic systolic CHF, ICD in place with prior shocks in the setting of VT, chronic kidney disease stage IIIb, COPD, obesity, comes to the hospital with complaints of groin pain, redness as well as foul-smelling odor from that area.  This was also associated with increased itching.  This has been going on for several days, he has tried some several home remedies without success.  Area has spread to his upper thigh areas as well as scrotum.  He did not notice any fluctuant areas.  He denies any systemic fever or chills.  No chest pains, shortness of breath, nausea, vomiting, diarrhea.  He tells me his diabetes has been poorly controlled recently.  No palpitations or recent shocks.  He reports ongoing dyspnea however this is baseline for him.  He denies any current lower extremity swelling or feeling fluid overloaded.  ED Course: In the emergency room he was afebrile 98.6, normotensive, not tachycardic.  He is satting well on room air.  His initial glucose was 455 and received insulin.  His blood work revealed a creatinine of 3.1 and white count was 9.2.  Lactic acid was 1.8.  A CT scan of the abdomen and pelvis did not show any subcutaneous air or abscess or any signs to suggest Fournier's gangrene, and no abscesses.  He was given vancomycin, Zosyn and a dose of clindamycin and we are asked to admit.  Review of Systems: All systems reviewed, and apart from HPI, all negative  Past Medical History:  Diagnosis Date   AICD (automatic cardioverter/defibrillator) present    Chronic bronchitis (HCC)    "get it most q yr"  (12/23/2013)   Chronic systolic (congestive) heart failure (HCC)    Fracture of left humerus    a. 07/2013.   GERD (gastroesophageal reflux disease)    not at present time   Heart murmur    "born w/it"    History of renal calculi    Hypertension    Kidney stones    NICM (nonischemic cardiomyopathy) (HCC)    a. 07/2013 Echo: EF 20-25%, diff HK, Gr2 DD, mild MR, mod dil LA/RA. 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    Shockable heart rhythm detected by automated external defibrillator    Syncope    Type II diabetes mellitus (HCC) 2006    Past Surgical History:  Procedure Laterality Date   CARDIAC CATHETERIZATION  08/10/13   CHOLECYSTECTOMY  ~ 2010   IMPLANTABLE CARDIOVERTER DEFIBRILLATOR IMPLANT  12/23/2013   STJ Fortify ICD implanted by Dr Johney Frame for cardiomyopathy and syncope   IMPLANTABLE CARDIOVERTER DEFIBRILLATOR IMPLANT N/A 12/23/2013   Procedure: IMPLANTABLE CARDIOVERTER DEFIBRILLATOR IMPLANT;  Surgeon: Gardiner Rhyme, MD;  Location: [facility] CATH LAB;  Service: Cardiovascular;  Laterality: N/A;   INGUINAL HERNIA REPAIR Left 03/01/2018   Procedure: OPEN REPAIR OF LEFT INGUINAL HERNIA WITH MESH;  Surgeon: Gaynelle Adu, MD;  Location: [facility] ORS;  Service: General;  Laterality: Left;   LEAD REVISION/REPAIR N/A 05/15/2022   Procedure: LEAD REVISION/REPAIR;  Surgeon: Lanier Prude, MD;  Location:  [facility] INVASIVE CV LAB;  Service: Cardiovascular;  Laterality: N/A;   LEFT HEART CATHETERIZATION WITH CORONARY ANGIOGRAM N/A 08/10/2013   Procedure: LEFT HEART CATHETERIZATION WITH CORONARY ANGIOGRAM;  Surgeon: Corky Crafts, MD;  Location: [facility] CATH LAB;  Service: Cardiovascular;  Laterality: N/A;   ORIF HUMERUS FRACTURE Left 08/12/2013   Procedure: OPEN REDUCTION INTERNAL FIXATION (ORIF) HUMERAL SHAFT FRACTURE;  Surgeon: Nadara Mustard, MD;  Location: [facility] OR;  Service: Orthopedics;  Laterality: Left;  Open Reduction Internal  Fixation Left Humerus   RIGHT/LEFT HEART CATH AND CORONARY ANGIOGRAPHY N/A 05/12/2022   Procedure: RIGHT/LEFT HEART CATH AND CORONARY ANGIOGRAPHY;  Surgeon: Dorthula Nettles, DO;  Location: [facility] INVASIVE CV LAB;  Service: Cardiovascular;  Laterality: N/A;   URETEROSCOPY     "laser for kidney stones"     reports that he quit smoking about 8 years ago. His smoking use included cigarettes. He started smoking about 36 years ago. He has a 28 pack-year smoking history. He has never used smokeless tobacco. He reports current alcohol use. He reports current drug use. Drug: Marijuana.  Allergies  Allergen Reactions   Bee Venom Anaphylaxis   Trulicity [Dulaglutide] Diarrhea    Extra dose of Trulicity caused cardiac arrest. Patient experience onset of diarrhea with using this medication - cleared after med stopped.    Family History  Problem Relation Age of Onset   Diabetes Father    Arthritis Father    Hypertension Father    Diabetes Mother    Arthritis Mother    Hypertension Mother    Hypertension Brother     Prior to Admission medications   Medication Sig Start Date End Date Taking? Authorizing Provider  Accu-Chek Softclix Lancets lancets Use as instructed 02/01/22   Lonell Grandchild, MD  acetaminophen (TYLENOL) 500 MG tablet Take 1,000 mg by mouth 2 (two) times daily.    [provider]  albuterol (VENTOLIN HFA) 108 (90 Base) MCG/ACT inhaler Inhale 2 puffs into the lungs every 6 (six) hours as needed for wheezing or shortness of breath. 10/24/22   Glenford Bayley, NP  amiodarone (PACERONE) 200 MG tablet Take 1 tablet (200 mg total) by mouth daily. 08/15/22   Sheilah Pigeon, PA-C  aspirin 81 MG tablet Take 81 mg by mouth daily.    [provider]  atorvastatin (LIPITOR) 10 MG tablet Take 1 tablet (10 mg total) by mouth daily. 01/27/23   Marcine Matar, MD  Blood Glucose Monitoring Suppl (ACCU-CHEK GUIDE) w/Device KIT UAD 02/01/22   Lonell Grandchild, MD  Blood  Glucose Monitoring Suppl DEVI 1 each by Does not apply route in the morning, at noon, and at bedtime. May substitute to any manufacturer covered by patient's insurance. 12/23/22   Carlus Pavlov, MD  cetirizine (EQ ALLERGY RELIEF, CETIRIZINE,) 10 MG tablet Take 1 tablet (10 mg total) by mouth daily. 10/30/22   Hetty Blend, FNP  cholecalciferol (VITAMIN D3) 25 MCG (1000 UNIT) tablet Take 1,000 Units by mouth daily.    [provider]  clobetasol cream (TEMOVATE) 0.05 % Apply 1 Application topically as needed.    [provider]  clonazePAM (KLONOPIN) 0.5 MG tablet Take 0.5 tablets (0.25 mg total) by mouth daily AND 1 tablet (0.5 mg total) at bedtime. 03/17/23 03/16/24  Stasia Cavalier, MD  Continuous Glucose Receiver (FREESTYLE LIBRE 2 READER) DEVI USE AS DIRECTED 03/06/23   Marcine Matar, MD  Continuous Glucose Sensor (FREESTYLE LIBRE 2 SENSOR) MISC Use to check blood sugar  TID. Change sensor once every 14 days. E11.65 03/06/23   Hoy Register, MD  dapagliflozin propanediol (FARXIGA) 10 MG TABS tablet Take 1 tablet (10 mg total) by mouth daily before breakfast. 05/26/22   [name], Aditya, DO  EPINEPHrine 0.3 mg/0.3 mL IJ SOAJ injection Inject 0.3 mg into the muscle as needed. 01/30/22   Alfonse Spruce, MD  famotidine (PEPCID) 20 MG tablet TAKE 1 TABLET BY MOUTH 1 TO 2 TIMES DAILY AS NEEDED 01/26/23   Ambs, Norvel Richards, FNP  fluticasone (FLONASE) 50 MCG/ACT nasal spray Use 1-2 sprays in each nostril once a day as needed for nasal congestion. 01/30/22   Alfonse Spruce, MD  Fluticasone-Umeclidin-Vilant (TRELEGY ELLIPTA IN) Inhale 1 puff into the lungs daily.    [provider]  gabapentin (NEURONTIN) 300 MG capsule Take 1 capsule by mouth at bedtime 10/15/22   Marcine Matar, MD  glucose blood (ACCU-CHEK GUIDE) test strip Use as instructed 02/01/22   Lonell Grandchild, MD  glucose blood (FREESTYLE LITE) test strip Use as instructed.  Please make sure this is  compatible with [facility] 2 reader. 07/14/22   Marcine Matar, MD  insulin aspart (NOVOLOG) 100 UNIT/ML injection Inject 24 Units into the skin 3 (three) times daily with meals.    [provider]  insulin glargine (LANTUS SOLOSTAR) 100 UNIT/ML Solostar Pen Inject 36 Units into the skin daily. 12/12/22   Carlus Pavlov, MD  Insulin Pen Needle (B-D ULTRAFINE III SHORT PEN) 31G X 8 MM MISC USE AS DIRECTED 03/10/23   Marcine Matar, MD  ipratropium-albuterol (DUONEB) 0.5-2.5 (3) MG/3ML SOLN Take 3 mLs by nebulization every 6 (six) hours as needed. 03/21/22   Lupita Leash, MD  ketoconazole (NIZORAL) 2 % cream Apply 1 Application topically daily. 03/12/23   Standiford, Jenelle Mages, DPM  Lancet Devices (ACCU-CHEK SOFTCLIX) lancets 1 each by Other route 3 (three) times daily. 10/20/14   Funches, Gerilyn Nestle, MD  methocarbamol (ROBAXIN) 500 MG tablet Take 500 mg by mouth daily.    [provider]  metoprolol succinate (TOPROL-XL) 50 MG 24 hr tablet Take 1 tablet (50 mg total) by mouth daily. 08/22/22   [name], Aditya, DO  miconazole (MICATIN) 2 % cream Apply to affected area twice a day 03/31/23   Marcine Matar, MD  sacubitril-valsartan (ENTRESTO) 24-26 MG Take 1 tablet by mouth 2 (two) times daily. 01/07/23   [name], Aditya, DO  sertraline (ZOLOFT) 50 MG tablet Take 1 tablet (50 mg total) by mouth daily. 03/17/23 03/16/24  Stasia Cavalier, MD  spironolactone (ALDACTONE) 25 MG tablet Take 0.5 tablets (12.5 mg total) by mouth daily. 05/26/22   [name], Aditya, DO  tamsulosin (FLOMAX) 0.4 MG CAPS capsule Take 2 capsules (0.8 mg total) by mouth daily. 01/27/23   Marcine Matar, MD  torsemide (DEMADEX) 20 MG tablet Take 4 tablets (80 mg total) by mouth daily. 02/23/23   Dorthula Nettles, DO    Physical Exam: Vitals:   04/03/23 0800 04/03/23 0815 04/03/23 1024 04/03/23 1200  BP: 110/60 96/74  (!) 115/57  Pulse: 64 64  63  Resp: 15 17  18   Temp:   98 F (36.7 C)   TempSrc:    Oral   SpO2: 93% 93%  94%  Weight: 136.1 kg     Height: 6' (1.829 m)       Constitutional: NAD, calm, comfortable Eyes: PERRL, lids and conjunctivae normal ENMT: Mucous membranes are moist.  Neck: normal, supple Respiratory: clear to  auscultation bilaterally, no wheezing, no crackles. Normal respiratory effort.  Cardiovascular: Regular rate and rhythm, no murmurs / rubs / gallops. No extremity edema.  Abdomen: no tenderness, no masses palpated. Bowel sounds positive.  Musculoskeletal: no clubbing / cyanosis. Normal muscle tone.  Skin: Cellulitic  rash in the upper thighs, groin area, inguinal areas involving the superficial scrotal area.  No fluctuance noted, no purulent discharges. Neurologic: CN 2-12 grossly intact. Strength 5/5 in all 4.  Psychiatric: Normal judgment and insight. Alert and oriented x 3. Normal mood.   Labs on Admission: I have personally reviewed following labs and imaging studies  CBC: Recent Labs  Lab 04/02/23 1552 04/03/23 1046  WBC 9.2 6.3  NEUTROABS  --  3.6  HGB 14.0 13.6  HCT 42.4 41.5  MCV 94.6 95.4  PLT 262 235   Basic Metabolic Panel: Recent Labs  Lab 04/02/23 1552 04/03/23 1046  NA 134* 141  K 4.0 4.0  CL 97* 105  CO2 26 27  GLUCOSE 455* 148*  BUN 44* 41*  CREATININE 3.14* 2.65*  CALCIUM 9.9 9.2   Liver Function Tests: Recent Labs  Lab 04/03/23 1046  AST 18  ALT 24  ALKPHOS 64  BILITOT 0.5  PROT 7.3  ALBUMIN 4.1   Coagulation Profile: No results for input(s): "INR", "PROTIME" in the last 168 hours. BNP (last 3 results) Recent Labs    07/23/22 1655  PROBNP 31.0   CBG: Recent Labs  Lab 04/02/23 1548 04/02/23 2005 04/03/23 0057 04/03/23 1124 04/03/23 1413  GLUCAP 421* 230* 317* 149* 110*   Thyroid Function Tests: No results for input(s): "TSH", "T4TOTAL", "FREET4", "T3FREE", "THYROIDAB" in the last 72 hours. Urine analysis:    Component Value Date/Time   COLORURINE COLORLESS (A) 04/02/2023 1552    APPEARANCEUR CLEAR 04/02/2023 1552   APPEARANCEUR Clear 07/17/2020 1655   LABSPEC 1.008 04/02/2023 1552   PHURINE 5.0 04/02/2023 1552   GLUCOSEU >1,000 (A) 04/02/2023 1552   HGBUR NEGATIVE 04/02/2023 1552   BILIRUBINUR NEGATIVE 04/02/2023 1552   BILIRUBINUR negative 01/30/2022 1705   BILIRUBINUR Negative 07/17/2020 1655   KETONESUR NEGATIVE 04/02/2023 1552   PROTEINUR NEGATIVE 04/02/2023 1552   UROBILINOGEN 0.2 01/30/2022 1705   UROBILINOGEN 1.0 06/20/2013 1032   NITRITE NEGATIVE 04/02/2023 1552   LEUKOCYTESUR NEGATIVE 04/02/2023 1552     Radiological Exams on Admission: CT ABDOMEN PELVIS WO CONTRAST  Result Date: 04/02/2023 CLINICAL DATA:  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 RADIATION DOSE REDUCTION: This exam was performed according to the departmental dose-optimization program which includes automated exposure control, adjustment of the mA and/or kV according to patient size and/or use of iterative reconstruction technique. COMPARISON:  03/17/2023 FINDINGS: Lower chest: No acute abnormality. Hepatobiliary: No focal liver abnormality is seen. Status post cholecystectomy. No biliary dilatation. Pancreas: Unremarkable. No pancreatic ductal dilatation or surrounding inflammatory changes. Spleen: Normal in size without focal abnormality. Adrenals/Urinary Tract: Adrenal glands show no bilateral adrenal nodule stable in appearance from the prior exam. These are unchanged from 2019 and consistent with benign adenomas. No further follow-up is recommended. Kidneys are well visualized bilaterally. No renal calculi or obstructive changes are seen. The bladder is within normal limits. Stomach/Bowel: Scattered diverticular change of the colon is noted. No evidence of diverticulitis is seen. The appendix is  within normal limits. Small bowel and stomach are unremarkable. Vascular/Lymphatic: Aortic  atherosclerosis. No enlarged abdominal or pelvic lymph nodes. Reproductive: Prostate is within normal limits. Other: Minimal edematous changes are noted in the perineum eccentric to the right best seen on the coronal images number 77 through 65 of series 5. No focal abscess is seen. No subcutaneous air is noted. No scrotal edema is seen. Musculoskeletal: No acute or significant osseous findings. IMPRESSION: Minimal edematous changes in the perineum extrinsic to the right consistent with the given clinical history. No subcutaneous air or abscess is noted to suggest Fournier's gangrene. No definitive skin breakdown is noted. Stable adrenal lesions bilaterally dating back to 2019. Consistent with adenomas and no further follow-up is recommended. Diverticulosis without diverticulitis. Electronically Signed   By: Alcide Clever M.D.   On: 04/02/2023 20:09     Assessment/Plan Principal problem Groin cellulitis -patient is afebrile, has no leukocytosis or elevated lactic acid.  He is nontoxic-appearing.  CT scan reassuring without subcutaneous gas or findings concerning for Fournier's gangrene.  Patient was placed on vancomycin and Zosyn, continue for now.  Blood cultures obtained in the ER are without growth as of now.  Active problems Chronic systolic CHF, NICM, ICD in place, history of V. Tach -noted, currently patient appears euvolemic.  No recent shocks.  Will continue home amiodarone, he is currently normotensive with systolic into the 1 teens.  At this point hold Entresto, spironolactone, torsemide and monitor vitals trend given active infection.  Resume metoprolol in the morning -Most recent cardiac cath December 2023 without obstructive CAD -Most recent 2D echo March 2024 shows LVEF 20 to 25%, global hypokinesis, grade 1 diastolic dysfunction.  RV was normal  Type 2 diabetes mellitus, insulin-dependent, poorly  controlled and with hyperglycemia -resume home glargine as well as mealtime insulin plus sliding scale, at lower doses.  Continue to monitor  Acute kidney injury on chronic kidney disease stage IIIb -baseline creatinine around 2.5, most recent 1 as an outpatient at 2.7.  On admission creatinine was 3.14, returned to baseline on most recent labs.  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  He would benefit from weight loss  Hyperlipidemia-continue Lipitor  Patient wishes to be DNR, tells me "I have been through a lot"  Scheduled Meds:  acetaminophen  1,000 mg Oral BID   amiodarone  200 mg Oral Daily   aspirin  81 mg Oral Daily   atorvastatin  10 mg Oral Daily   clonazePAM  0.5 mg Oral BID   clotrimazole   Topical BID   enoxaparin (LOVENOX) injection  65 mg Subcutaneous Q24H   Fluticasone-Umeclidin-Vilant   Inhalation Daily   gabapentin  300 mg Oral QHS   insulin aspart  0-5 Units Subcutaneous QHS   insulin aspart  0-5 Units Subcutaneous QHS   insulin aspart  0-9 Units Subcutaneous TID WC   insulin aspart  0-9 Units Subcutaneous TID WC   insulin aspart  4 Units Subcutaneous TID WC   insulin glargine-yfgn  28 Units Subcutaneous QHS   insulin glargine-yfgn  36 Units Subcutaneous QHS   methocarbamol  500 mg Oral Daily   [START ON 04/04/2023] metoprolol succinate  50 mg Oral Daily   sertraline  50 mg Oral Daily   tamsulosin  0.8 mg Oral Daily   Continuous Infusions:  piperacillin-tazobactam (ZOSYN)  IV 3.375 g (04/03/23 0909)   [START ON 04/04/2023] vancomycin     PRN Meds:.acetaminophen **OR** acetaminophen, ipratropium-albuterol, ondansetron **OR** ondansetron (ZOFRAN) IV  DVT prophylaxis: Lovenox  Code Status: DNR (per patient's wishes)  Family Communication: no family at bedside  Disposition Plan: home when ready  Bed Type: progressive Consults called: none  Obs/Inp: Observation / per  accepting MD  Pamella Pert, MD, PhD [facility]  Contact via [email or web address]  04/03/2023, 3:25 PM

## 2023-04-03 NOTE — Progress Notes (Signed)
Pharmacy Antibiotic Note  MUBARAK PELON is a 57 y.o. male admitted on 04/02/2023 with cellulitis. Pharmacy has been consulted for vancomycin and zosyn dosing. Pt is afebrile and WBC is WNL. Scr is elevated above baseline at 3.14. Lactic acid is <2.   Plan: Vancomycin 1750mg  IV Q48H (AUC 463, SCr 3.14, Vd 0.5) Zosyn 3.375g IV Q8H (4 hr inf) F/u renal fxn, C&S, clinical status and peak/trough at SS  Height: 6' (182.9 cm) Weight: 136.1 kg (300 lb) IBW/kg (Calculated) : 77.6  Temp (24hrs), Avg:98.4 F (36.9 C), Min:98 F (36.7 C), Max:98.6 F (37 C)  Recent Labs  Lab 04/02/23 1552 04/02/23 1903  WBC 9.2  --   CREATININE 3.14*  --   LATICACIDVEN  --  1.8    Estimated Creatinine Clearance: 36.6 mL/min (A) (by C-G formula based on SCr of 3.14 mg/dL (H)).    Allergies  Allergen Reactions   Bee Venom Anaphylaxis   Trulicity [Dulaglutide] Diarrhea    Extra dose of Trulicity caused cardiac arrest. Patient experience onset of diarrhea with using this medication - cleared after med stopped.    Antimicrobials this admission: Vanc 10/24>> Zosyn 10/24>> Clinda x 1 10/24  Dose adjustments this admission: N/A  Microbiology results: Pending  Thank you for allowing pharmacy to be a part of this patient's care.  Momoko Slezak, Drake Leach 04/03/2023 8:40 AM

## 2023-04-04 DIAGNOSIS — N1832 Chronic kidney disease, stage 3b: Secondary | ICD-10-CM | POA: Diagnosis not present

## 2023-04-04 DIAGNOSIS — I130 Hypertensive heart and chronic kidney disease with heart failure and stage 1 through stage 4 chronic kidney disease, or unspecified chronic kidney disease: Secondary | ICD-10-CM | POA: Diagnosis not present

## 2023-04-04 DIAGNOSIS — Z66 Do not resuscitate: Secondary | ICD-10-CM | POA: Diagnosis not present

## 2023-04-04 DIAGNOSIS — L03314 Cellulitis of groin: Secondary | ICD-10-CM | POA: Diagnosis not present

## 2023-04-04 DIAGNOSIS — N400 Enlarged prostate without lower urinary tract symptoms: Secondary | ICD-10-CM | POA: Diagnosis not present

## 2023-04-04 DIAGNOSIS — Z87891 Personal history of nicotine dependence: Secondary | ICD-10-CM | POA: Diagnosis not present

## 2023-04-04 DIAGNOSIS — E66813 Obesity, class 3: Secondary | ICD-10-CM | POA: Diagnosis not present

## 2023-04-04 DIAGNOSIS — I428 Other cardiomyopathies: Secondary | ICD-10-CM | POA: Diagnosis not present

## 2023-04-04 DIAGNOSIS — Z9049 Acquired absence of other specified parts of digestive tract: Secondary | ICD-10-CM | POA: Diagnosis not present

## 2023-04-04 DIAGNOSIS — Z794 Long term (current) use of insulin: Secondary | ICD-10-CM | POA: Diagnosis not present

## 2023-04-04 DIAGNOSIS — J4489 Other specified chronic obstructive pulmonary disease: Secondary | ICD-10-CM | POA: Diagnosis not present

## 2023-04-04 DIAGNOSIS — N179 Acute kidney failure, unspecified: Secondary | ICD-10-CM | POA: Diagnosis not present

## 2023-04-04 DIAGNOSIS — Z888 Allergy status to other drugs, medicaments and biological substances status: Secondary | ICD-10-CM | POA: Diagnosis not present

## 2023-04-04 DIAGNOSIS — E1165 Type 2 diabetes mellitus with hyperglycemia: Secondary | ICD-10-CM | POA: Diagnosis not present

## 2023-04-04 DIAGNOSIS — E785 Hyperlipidemia, unspecified: Secondary | ICD-10-CM | POA: Diagnosis not present

## 2023-04-04 DIAGNOSIS — F419 Anxiety disorder, unspecified: Secondary | ICD-10-CM | POA: Diagnosis not present

## 2023-04-04 DIAGNOSIS — B356 Tinea cruris: Secondary | ICD-10-CM | POA: Diagnosis not present

## 2023-04-04 DIAGNOSIS — F32A Depression, unspecified: Secondary | ICD-10-CM | POA: Diagnosis not present

## 2023-04-04 DIAGNOSIS — E1122 Type 2 diabetes mellitus with diabetic chronic kidney disease: Secondary | ICD-10-CM | POA: Diagnosis not present

## 2023-04-04 DIAGNOSIS — Z95810 Presence of automatic (implantable) cardiac defibrillator: Secondary | ICD-10-CM | POA: Diagnosis not present

## 2023-04-04 DIAGNOSIS — Z6841 Body Mass Index (BMI) 40.0 and over, adult: Secondary | ICD-10-CM | POA: Diagnosis not present

## 2023-04-04 DIAGNOSIS — Z87442 Personal history of urinary calculi: Secondary | ICD-10-CM | POA: Diagnosis not present

## 2023-04-04 DIAGNOSIS — I5022 Chronic systolic (congestive) heart failure: Secondary | ICD-10-CM | POA: Diagnosis not present

## 2023-04-04 DIAGNOSIS — L03315 Cellulitis of perineum: Secondary | ICD-10-CM | POA: Diagnosis not present

## 2023-04-04 LAB — ABO/RH: ABO/RH(D): A POS

## 2023-04-04 LAB — CBC WITH DIFFERENTIAL/PLATELET
Abs Immature Granulocytes: 0.07 10*3/uL (ref 0.00–0.07)
Basophils Absolute: 0.1 10*3/uL (ref 0.0–0.1)
Basophils Relative: 1 %
Eosinophils Absolute: 0.1 10*3/uL (ref 0.0–0.5)
Eosinophils Relative: 2 %
HCT: 45.7 % (ref 39.0–52.0)
Hemoglobin: 14.8 g/dL (ref 13.0–17.0)
Immature Granulocytes: 1 %
Lymphocytes Relative: 32 %
Lymphs Abs: 2.4 10*3/uL (ref 0.7–4.0)
MCH: 31.6 pg (ref 26.0–34.0)
MCHC: 32.4 g/dL (ref 30.0–36.0)
MCV: 97.6 fL (ref 80.0–100.0)
Monocytes Absolute: 0.6 10*3/uL (ref 0.1–1.0)
Monocytes Relative: 8 %
Neutro Abs: 4.1 10*3/uL (ref 1.7–7.7)
Neutrophils Relative %: 56 %
Platelets: 211 10*3/uL (ref 150–400)
RBC: 4.68 MIL/uL (ref 4.22–5.81)
RDW: 14.5 % (ref 11.5–15.5)
WBC: 7.3 10*3/uL (ref 4.0–10.5)
nRBC: 0.3 % — ABNORMAL HIGH (ref 0.0–0.2)

## 2023-04-04 LAB — BASIC METABOLIC PANEL
Anion gap: 13 (ref 5–15)
BUN: 42 mg/dL — ABNORMAL HIGH (ref 6–20)
CO2: 26 mmol/L (ref 22–32)
Calcium: 9.2 mg/dL (ref 8.9–10.3)
Chloride: 100 mmol/L (ref 98–111)
Creatinine, Ser: 2.83 mg/dL — ABNORMAL HIGH (ref 0.61–1.24)
GFR, Estimated: 25 mL/min — ABNORMAL LOW (ref >60)
Glucose, Bld: 199 mg/dL — ABNORMAL HIGH (ref 70–99)
Potassium: 4.5 mmol/L (ref 3.5–5.1)
Sodium: 139 mmol/L (ref 135–145)

## 2023-04-04 LAB — GLUCOSE, CAPILLARY
Glucose-Capillary: 258 mg/dL — ABNORMAL HIGH (ref 70–99)
Glucose-Capillary: 157 mg/dL — ABNORMAL HIGH (ref 70–99)
Glucose-Capillary: 183 mg/dL — ABNORMAL HIGH (ref 70–99)
Glucose-Capillary: 222 mg/dL — ABNORMAL HIGH (ref 70–99)

## 2023-04-04 LAB — C-REACTIVE PROTEIN: CRP: 1.6 mg/dL — ABNORMAL HIGH (ref <1.0)

## 2023-04-04 LAB — BRAIN NATRIURETIC PEPTIDE: B Natriuretic Peptide: 340.6 pg/mL — ABNORMAL HIGH (ref 0.0–100.0)

## 2023-04-04 LAB — PROTIME-INR
INR: 1.1 (ref 0.8–1.2)
Prothrombin Time: 14.2 s (ref 11.4–15.2)

## 2023-04-04 LAB — HEMOGLOBIN A1C
Hgb A1c MFr Bld: 9.3 % — ABNORMAL HIGH (ref 4.8–5.6)
Mean Plasma Glucose: 220 mg/dL

## 2023-04-04 LAB — PROCALCITONIN: Procalcitonin: 0.11 ng/mL

## 2023-04-04 LAB — MAGNESIUM: Magnesium: 2.8 mg/dL — ABNORMAL HIGH (ref 1.7–2.4)

## 2023-04-04 MED ORDER — SPIRONOLACTONE 12.5 MG HALF TABLET
12.5000 mg | ORAL_TABLET | Freq: Every day | ORAL | Status: DC
  Administered 2023-04-04 – 2023-04-07 (×4): 12.5 mg via ORAL
  Filled 2023-04-04 (×4): qty 1

## 2023-04-04 MED ORDER — ENOXAPARIN SODIUM 80 MG/0.8ML IJ SOSY
70.0000 mg | PREFILLED_SYRINGE | INTRAMUSCULAR | Status: DC
  Administered 2023-04-04 – 2023-04-06 (×3): 70 mg via SUBCUTANEOUS
  Filled 2023-04-04 (×3): qty 0.8

## 2023-04-04 MED ORDER — INSULIN GLARGINE-YFGN 100 UNIT/ML ~~LOC~~ SOLN
35.0000 [IU] | Freq: Every day | SUBCUTANEOUS | Status: DC
  Administered 2023-04-04 – 2023-04-06 (×3): 35 [IU] via SUBCUTANEOUS
  Filled 2023-04-04 (×4): qty 0.35

## 2023-04-04 MED ORDER — NYSTATIN 100000 UNIT/GM EX POWD
Freq: Three times a day (TID) | CUTANEOUS | Status: DC
  Filled 2023-04-04: qty 15

## 2023-04-04 NOTE — Plan of Care (Signed)
  Problem: Education: Goal: Knowledge of General Education information will improve Description: Including pain rating scale, medication(s)/side effects and non-pharmacologic comfort measures Outcome: Progressing   Problem: Health Behavior/Discharge Planning: Goal: Ability to manage health-related needs will improve Outcome: Progressing   Problem: Clinical Measurements: Goal: Will remain free from infection Outcome: Progressing   Problem: Nutrition: Goal: Adequate nutrition will be maintained Outcome: Progressing   Problem: Pain Management: Goal: General experience of comfort will improve Outcome: Progressing

## 2023-04-04 NOTE — Plan of Care (Signed)
  Problem: Education: Goal: Knowledge of General Education information will improve Description: Including pain rating scale, medication(s)/side effects and non-pharmacologic comfort measures Outcome: Progressing   Problem: Clinical Measurements: Goal: Ability to maintain clinical measurements within normal limits will improve Outcome: Progressing Goal: Will remain free from infection Outcome: Progressing Goal: Respiratory complications will improve Outcome: Progressing Goal: Cardiovascular complication will be avoided Outcome: Progressing   Problem: Activity: Goal: Risk for activity intolerance will decrease Outcome: Progressing   Problem: Nutrition: Goal: Adequate nutrition will be maintained Outcome: Progressing   Problem: Coping: Goal: Level of anxiety will decrease Outcome: Progressing   Problem: Elimination: Goal: Will not experience complications related to bowel motility Outcome: Progressing Goal: Will not experience complications related to urinary retention Outcome: Progressing   Problem: Pain Management: Goal: General experience of comfort will improve Outcome: Progressing   Problem: Skin Integrity: Goal: Risk for impaired skin integrity will decrease Outcome: Not Progressing

## 2023-04-04 NOTE — Progress Notes (Signed)
PT Cancellation Note  Patient Details Name: [name] MRN: [id number] DOB: 11-13-1965   Cancelled Treatment:    Reason Eval/Treat Not Completed: PT screened, no needs identified, will sign off  Patient reports no problems with mobility or balance. Has been up to bathroom without difficulty. Politely defers PT evaluation. Noted pt was seen for PT eval 03/02/23 during last admission with no PT needs.    Jerolyn Center, PT Acute Rehabilitation Services  Office [phone]  Zena Amos 04/04/2023, 9:00 AM

## 2023-04-04 NOTE — Evaluation (Signed)
Occupational Therapy Evaluation and DC Summary Patient Details Name: [name] MRN: [id number] DOB: 1965/11/14 Today's Date: 04/04/2023   History of Present Illness 57 y.o. male comes to the hospital 04/03/23 with complaints of groin pain, redness as well as foul-smelling odor from that area. +cellulitis;  PMH significant of type 2 diabetes mellitus, nonischemic cardiomyopathy with chronic systolic CHF, ICD in place with prior shocks in the setting of VT, chronic kidney disease stage IIIb, COPD, obesity,   Clinical Impression   Pt declining any balance or ADL deficits, noted to have moisture in perineal region. Educated on a skin hygiene routine options to consider more breathable underwear. Pt has no further acute skilled OT needs and no post acute needs. Discussed with RN and MD to put in a consult with [facility] to have pt trial interdry to help with moisture.        If plan is discharge home, recommend the following:      Functional Status Assessment  Patient has not had a recent decline in their functional status  Equipment Recommendations  None recommended by OT    Recommendations for Other Services Other (comment) ([facility]-would benefit from Interdry)     Precautions / Restrictions Precautions Precautions: None Restrictions Weight Bearing Restrictions: No      Mobility Bed Mobility               General bed mobility comments: deferred    Transfers                   General transfer comment: deferred          ADL either performed or assessed with clinical judgement   ADL Overall ADL's : Independent                                       General ADL Comments: Pt reports being ind with ADLs and ambulating in room without functoinal challenge. Educated/discussed with pt a routine to monitor excessive moisture retention in groin region, also discussed using more breathable under garments to reduce the moisture. Reached out to RN and MD  in hopes of trying interdry      Pertinent Vitals/Pain Pain Assessment Pain Assessment: Faces Faces Pain Scale: Hurts little more Pain Location: Groin region- following application of antifungal poweder Pain Descriptors / Indicators: Discomfort, Burning Pain Intervention(s): Monitored during session     Extremity/Trunk Assessment Upper Extremity Assessment Upper Extremity Assessment: Overall WFL for tasks assessed   Lower Extremity Assessment Lower Extremity Assessment: Overall WFL for tasks assessed       Communication Communication Communication: No apparent difficulties   Cognition Arousal: Alert Behavior During Therapy: WFL for tasks assessed/performed Overall Cognitive Status: Within Functional Limits for tasks assessed                                       General Comments  moderate moisture of perineal/groin region, no excessive swelling of genitals    Exercises     Shoulder Instructions      Home Living                                          Prior Functioning/Environment Prior Level of Function :  Independent/Modified Independent                        OT Problem List: Pain      OT Treatment/Interventions:      OT Goals(Current goals can be found in the care plan section) Acute Rehab OT Goals OT Goal Formulation: All assessment and education complete, DC therapy Time For Goal Achievement: 04/18/23 Potential to Achieve Goals: Good  OT Frequency:      Co-evaluation              AM-PAC OT "6 Clicks" Daily Activity     Outcome Measure Help from another person eating meals?: None Help from another person taking care of personal grooming?: None Help from another person toileting, which includes using toliet, bedpan, or urinal?: None Help from another person bathing (including washing, rinsing, drying)?: None Help from another person to put on and taking off regular upper body clothing?: None Help from  another person to put on and taking off regular lower body clothing?: None 6 Click Score: 24   End of Session Nurse Communication: Mobility status  Activity Tolerance: Patient tolerated treatment well Patient left: in bed;with call bell/phone within reach  OT Visit Diagnosis: Pain Pain - part of body:  (groin)                Time: 9811-9147 OT Time Calculation (min): 16 min Charges:  OT General Charges $OT Visit: 1 Visit OT Evaluation $OT Eval Low Complexity: 1 Low  04/04/2023  AB, OTR/L  Acute Rehabilitation Services  Office: [phone]   Tristan Schroeder 04/04/2023, 3:31 PM

## 2023-04-04 NOTE — Progress Notes (Signed)
PROGRESS NOTE                                                                                                                                                                                                             Patient Demographics:    [name], is a 57 y.o. male, DOB - July 24, 1965, UJW:[id number]  Outpatient Primary MD for the patient is Marcine Matar, MD    LOS - 0  Admit date - 04/02/2023    Chief Complaint  Patient presents with   Rash    Jock itch   Hyperglycemia       Brief Narrative (HPI from H&P)   57 y.o. male with medical history significant of type 2 diabetes mellitus, nonischemic cardiomyopathy with chronic systolic CHF, ICD in place with prior shocks in the setting of VT, chronic kidney disease stage IIIb, COPD, obesity, comes to the hospital with complaints of groin pain, redness as well as foul-smelling odor from that area.  This was also associated with increased itching.  This has been going on for several days, he has tried some several home remedies without success.  Area has spread to his upper thigh areas as well as scrotum, came to the ER where workup was consistent with scrotal and groin cellulitis and he was admitted.   Subjective:    [name] today has, No headache, No chest pain, No abdominal pain - No Nausea, No new weakness tingling or numbness, no SOB, improved groin discomfort.   Assessment  & Plan :   Groin cellulitis - patient is afebrile, has no leukocytosis or elevated lactic acid.  He is nontoxic-appearing.  CT scan reassuring without subcutaneous gas or findings concerning for Fournier's gangrene, due to his poorly controlled diabetes mellitus and the site of his cellulitis he remains at risk for developing Fournier's gangrene hence we will continue IV antibiotics for another 2 to 3 days till there is noticeable improvement in his cellulitis, keep the site clean and dry,  nystatin powder 3 times daily and monitor..   Chronic systolic CHF, NICM, ICD in place, history of V. Tach - noted, currently patient appears euvolemic.  No recent shocks.   -Most recent cardiac cath December 2023 without obstructive CAD -Most recent 2D echo March 2024 shows LVEF 20 to 25%, global hypokinesis, grade 1 diastolic dysfunction.  RV was normal   Will continue home amiodarone, resume home dose metoprolol, blood pressure still soft with AKI hence holding Entresto-Aldactone and Lasix.      Acute kidney injury on chronic kidney disease stage IIIb -baseline creatinine around 2.5, most recent 1 as an outpatient at 2.7.  On admission creatinine was 3.14, returned to baseline on most recent labs.   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  He would benefit from weight loss, follow with PCP.   Hyperlipidemia-continue Lipitor   Type 2 diabetes mellitus, insulin-dependent, poorly controlled and with hyperglycemia -adjusted glargine dose on 04/04/2023, continue sliding scale and monitor, poor outpatient glycemic control. Continue to monitor  Lab Results  Component Value Date   HGBA1C 9.3 (H) 04/03/2023   CBG (last 3)  Recent Labs    04/03/23 1806 04/03/23 2110 04/04/23 0818  GLUCAP 248* 143* 258*        Condition - Fair  Family Communication  :  None  Code Status :  Full  Consults  :    PUD Prophylaxis :     Procedures  :     CT -  Minimal edematous changes in the perineum extrinsic to the right consistent with the given clinical history. No subcutaneous air or abscess is noted to suggest Fournier's gangrene. No definitive skin breakdown is noted. Stable adrenal lesions bilaterally dating back to 2019. Consistent with adenomas and no further follow-up is recommended. Diverticulosis without diverticulitis.      Disposition Plan  :    Status is: Observation  DVT Prophylaxis   :  Lovenox   Lab Results  Component Value Date   PLT 211 04/04/2023    Diet :  Diet Order             Diet heart healthy/carb modified Room service appropriate? Yes; Fluid consistency: Thin  Diet effective now                    Inpatient Medications  Scheduled Meds:  acetaminophen  1,000 mg Oral BID   amiodarone  200 mg Oral Daily   aspirin EC  81 mg Oral Daily   atorvastatin  10 mg Oral Daily   clonazePAM  0.5 mg Oral BID   enoxaparin (LOVENOX) injection  65 mg Subcutaneous Q24H   fluticasone furoate-vilanterol  1 puff Inhalation Daily   And   umeclidinium bromide  1 puff Inhalation Daily   gabapentin  300 mg Oral QHS   insulin aspart  0-5 Units Subcutaneous QHS   insulin aspart  0-9 Units Subcutaneous TID WC   insulin aspart  4 Units Subcutaneous TID WC   insulin glargine-yfgn  28 Units Subcutaneous QHS   methocarbamol  500 mg Oral Daily   metoprolol succinate  50 mg Oral Daily   nystatin   Topical TID   sertraline  50 mg Oral Daily   tamsulosin  0.8 mg Oral Daily   Continuous Infusions:  piperacillin-tazobactam (ZOSYN)  IV 3.375 g (04/04/23 1002)   vancomycin     PRN Meds:.acetaminophen **OR** acetaminophen, ipratropium-albuterol, ondansetron **OR** ondansetron (ZOFRAN) IV     Objective:   Vitals:   04/04/23 0200 04/04/23 0300 04/04/23 0322 04/04/23 0500  BP:   126/70   Pulse: 64 65 64 68  Resp: 16 18 17 17   Temp:   98 F (36.7 C)   TempSrc:   Oral   SpO2: 95% 90% 94%  91%  Weight:      Height:        Wt Readings from Last 3 Encounters:  04/03/23 136.1 kg  03/17/23 136.1 kg  03/16/23 132.5 kg     Intake/Output Summary (Last 24 hours) at 04/04/2023 1053 Last data filed at 04/04/2023 0530 Gross per 24 hour  Intake 99.94 ml  Output --  Net 99.94 ml     Physical Exam  Awake Alert, No new F.N deficits, Normal affect [place].AT,PERRAL Supple Neck, No JVD,   Symmetrical Chest wall movement, Good air movement bilaterally, CTAB RRR,No  Gallops,Rubs or new Murmurs,  +ve B.Sounds, Abd Soft, No tenderness,   Perineal cellulitis site stable, no fluctuance      Data Review:    Recent Labs  Lab 04/02/23 1552 04/03/23 1046 04/04/23 0639  WBC 9.2 6.3 7.3  HGB 14.0 13.6 14.8  HCT 42.4 41.5 45.7  PLT 262 235 211  MCV 94.6 95.4 97.6  MCH 31.3 31.3 31.6  MCHC 33.0 32.8 32.4  RDW 14.5 14.5 14.5  LYMPHSABS  --  1.9 2.4  MONOABS  --  0.6 0.6  EOSABS  --  0.1 0.1  BASOSABS  --  0.0 0.1    Recent Labs  Lab 04/02/23 1552 04/02/23 1903 04/03/23 0854 04/03/23 1046 04/04/23 0639  NA 134*  --   --  141 139  K 4.0  --   --  4.0 4.5  CL 97*  --   --  105 100  CO2 26  --   --  27 26  ANIONGAP 11  --   --  9 13  GLUCOSE 455*  --   --  148* 199*  BUN 44*  --   --  41* 42*  CREATININE 3.14*  --   --  2.65* 2.83*  AST  --   --   --  18  --   ALT  --   --   --  24  --   ALKPHOS  --   --   --  64  --   BILITOT  --   --   --  0.5  --   ALBUMIN  --   --   --  4.1  --   CRP  --   --   --   --  1.6*  PROCALCITON  --   --   --   --  0.11  LATICACIDVEN  --  1.8  --   --   --   INR  --   --   --   --  1.1  HGBA1C  --   --  9.3*  --   --   MG  --   --   --   --  2.8*  CALCIUM 9.9  --   --  9.2 9.2      Recent Labs  Lab 04/02/23 1552 04/02/23 1903 04/03/23 0854 04/03/23 1046 04/04/23 0639  CRP  --   --   --   --  1.6*  PROCALCITON  --   --   --   --  0.11  LATICACIDVEN  --  1.8  --   --   --   INR  --   --   --   --  1.1  HGBA1C  --   --  9.3*  --   --   MG  --   --   --   --  2.8*  CALCIUM 9.9  --   --  9.2 9.2    --------------------------------------------------------------------------------------------------------------- Lab Results  Component Value Date   CHOL 166 02/13/2023   HDL 44 02/13/2023   LDLCALC 76 02/13/2023   TRIG 283 (H) 02/13/2023   CHOLHDL 3.8 02/13/2023    Lab Results  Component Value Date   HGBA1C 9.3 (H) 04/03/2023   No results for input(s): "TSH", "T4TOTAL", "FREET4", "T3FREE",  "THYROIDAB" in the last 72 hours. No results for input(s): "VITAMINB12", "FOLATE", "FERRITIN", "TIBC", "IRON", "RETICCTPCT" in the last 72 hours. ------------------------------------------------------------------------------------------------------------------ Cardiac Enzymes No results for input(s): "CKMB", "TROPONINI", "MYOGLOBIN" in the last 168 hours.  Invalid input(s): "CK"  Micro Results Recent Results (from the past 240 hour(s))  Blood culture (routine x 2)     Status: None (Preliminary result)   Collection Time: 04/02/23  6:33 PM   Specimen: BLOOD  Result Value Ref Range Status   Specimen Description   Final    BLOOD BLOOD LEFT FOREARM Performed at [facility], [address] [place], [place] [address]    Special Requests   Final    Blood Culture adequate volume BOTTLES DRAWN AEROBIC AND ANAEROBIC Performed at [facility], [address] [place], [place] [address]    Culture   Final    NO GROWTH 2 DAYS Performed at [facility], [address] [place], [place] [address]    Report Status PENDING  Incomplete  Blood culture (routine x 2)     Status: None (Preliminary result)   Collection Time: 04/02/23  6:38 PM   Specimen: BLOOD  Result Value Ref Range Status   Specimen Description   Final    BLOOD BLOOD RIGHT FOREARM Performed at [facility], [address], [place], [place] [address]    Special Requests   Final    Blood Culture adequate volume BOTTLES DRAWN AEROBIC AND ANAEROBIC Performed at [facility], [address] [place], [place] [address]    Culture   Final    NO GROWTH 2 DAYS Performed at [facility], [address], [place], [place] [address]    Report Status PENDING  Incomplete    Radiology Reports CT ABDOMEN PELVIS WO CONTRAST  Result Date: 04/02/2023 CLINICAL DATA:  Possible Fournier's gangrene of the scrotum, initial encounter EXAM: CT ABDOMEN AND  PELVIS WITHOUT CONTRAST TECHNIQUE: Multidetector CT imaging of the abdomen and pelvis was performed following the standard protocol without IV contrast. RADIATION DOSE REDUCTION: This exam was performed according to the departmental dose-optimization program which includes automated exposure control, adjustment of the mA and/or kV according to patient size and/or use of iterative reconstruction technique. COMPARISON:  03/17/2023 FINDINGS: Lower chest: No acute abnormality. Hepatobiliary: No focal liver abnormality is seen. Status post cholecystectomy. No biliary dilatation. Pancreas: Unremarkable. No pancreatic ductal dilatation or surrounding inflammatory changes. Spleen: Normal in size without focal abnormality. Adrenals/Urinary Tract: Adrenal glands show no bilateral adrenal nodule stable in appearance from the prior exam. These are unchanged from 2019 and consistent with benign adenomas. No further follow-up is recommended. Kidneys are well visualized bilaterally. No renal calculi or obstructive changes are seen. The bladder is within normal limits. Stomach/Bowel: Scattered diverticular change of the colon is noted. No evidence of diverticulitis is seen. The appendix is within normal limits. Small bowel and stomach are unremarkable. Vascular/Lymphatic: Aortic atherosclerosis. No enlarged abdominal or pelvic lymph nodes. Reproductive: Prostate is within normal limits. Other: Minimal edematous changes are noted in the perineum eccentric to the right best seen on the coronal images  number 77 through 65 of series 5. No focal abscess is seen. No subcutaneous air is noted. No scrotal edema is seen. Musculoskeletal: No acute or significant osseous findings. IMPRESSION: Minimal edematous changes in the perineum extrinsic to the right consistent with the given clinical history. No subcutaneous air or abscess is noted to suggest Fournier's gangrene. No definitive skin breakdown is noted. Stable adrenal lesions bilaterally  dating back to 2019. Consistent with adenomas and no further follow-up is recommended. Diverticulosis without diverticulitis. Electronically Signed   By: Alcide Clever M.D.   On: 04/02/2023 20:09      Signature  -   Susa Raring M.D on 04/04/2023 at 10:53 AM   -  To page go to [email or web address]

## 2023-04-05 DIAGNOSIS — L03315 Cellulitis of perineum: Secondary | ICD-10-CM | POA: Diagnosis not present

## 2023-04-05 LAB — CBC WITH DIFFERENTIAL/PLATELET
Abs Immature Granulocytes: 0.1 10*3/uL — ABNORMAL HIGH (ref 0.00–0.07)
Basophils Absolute: 0.1 10*3/uL (ref 0.0–0.1)
Basophils Relative: 1 %
Eosinophils Absolute: 0.2 10*3/uL (ref 0.0–0.5)
Eosinophils Relative: 2 %
HCT: 41.6 % (ref 39.0–52.0)
Hemoglobin: 13.6 g/dL (ref 13.0–17.0)
Immature Granulocytes: 1 %
Lymphocytes Relative: 32 %
Lymphs Abs: 2.5 10*3/uL (ref 0.7–4.0)
MCH: 30.8 pg (ref 26.0–34.0)
MCHC: 32.7 g/dL (ref 30.0–36.0)
MCV: 94.1 fL (ref 80.0–100.0)
Monocytes Absolute: 0.7 10*3/uL (ref 0.1–1.0)
Monocytes Relative: 8 %
Neutro Abs: 4.3 10*3/uL (ref 1.7–7.7)
Neutrophils Relative %: 56 %
Platelets: 271 10*3/uL (ref 150–400)
RBC: 4.42 MIL/uL (ref 4.22–5.81)
RDW: 14.2 % (ref 11.5–15.5)
WBC: 7.8 10*3/uL (ref 4.0–10.5)
nRBC: 0 % (ref 0.0–0.2)

## 2023-04-05 LAB — MAGNESIUM: Magnesium: 2.5 mg/dL — ABNORMAL HIGH (ref 1.7–2.4)

## 2023-04-05 LAB — BASIC METABOLIC PANEL
Anion gap: 9 (ref 5–15)
BUN: 43 mg/dL — ABNORMAL HIGH (ref 6–20)
CO2: 26 mmol/L (ref 22–32)
Calcium: 9.6 mg/dL (ref 8.9–10.3)
Chloride: 105 mmol/L (ref 98–111)
Creatinine, Ser: 2.52 mg/dL — ABNORMAL HIGH (ref 0.61–1.24)
GFR, Estimated: 29 mL/min — ABNORMAL LOW (ref >60)
Glucose, Bld: 146 mg/dL — ABNORMAL HIGH (ref 70–99)
Potassium: 4.3 mmol/L (ref 3.5–5.1)
Sodium: 140 mmol/L (ref 135–145)

## 2023-04-05 LAB — PROCALCITONIN: Procalcitonin: <0.1 ng/mL

## 2023-04-05 LAB — C-REACTIVE PROTEIN: CRP: 1.2 mg/dL — ABNORMAL HIGH (ref <1.0)

## 2023-04-05 LAB — PHOSPHORUS: Phosphorus: 4.1 mg/dL (ref 2.5–4.6)

## 2023-04-05 LAB — GLUCOSE, CAPILLARY
Glucose-Capillary: 156 mg/dL — ABNORMAL HIGH (ref 70–99)
Glucose-Capillary: 186 mg/dL — ABNORMAL HIGH (ref 70–99)
Glucose-Capillary: 193 mg/dL — ABNORMAL HIGH (ref 70–99)
Glucose-Capillary: 246 mg/dL — ABNORMAL HIGH (ref 70–99)

## 2023-04-05 LAB — TYPE AND SCREEN
ABO/RH(D): A POS
Antibody Screen: NEGATIVE

## 2023-04-05 LAB — BRAIN NATRIURETIC PEPTIDE: B Natriuretic Peptide: 57.4 pg/mL (ref 0.0–100.0)

## 2023-04-05 MED ORDER — LOPERAMIDE HCL 2 MG PO CAPS
2.0000 mg | ORAL_CAPSULE | ORAL | Status: DC | PRN
  Administered 2023-04-05 – 2023-04-07 (×6): 2 mg via ORAL
  Filled 2023-04-05 (×7): qty 1

## 2023-04-05 MED ORDER — VANCOMYCIN HCL 1750 MG/350ML IV SOLN
1750.0000 mg | INTRAVENOUS | Status: DC
  Filled 2023-04-05: qty 350

## 2023-04-05 MED ORDER — TORSEMIDE 20 MG PO TABS
20.0000 mg | ORAL_TABLET | Freq: Every day | ORAL | Status: DC
  Administered 2023-04-05: 20 mg via ORAL
  Filled 2023-04-05: qty 1

## 2023-04-05 MED ORDER — SPIRONOLACTONE 25 MG PO TABS: 25.0000 mg | ORAL_TABLET | Freq: Every day | ORAL | Status: DC

## 2023-04-05 MED ORDER — GERHARDT'S BUTT CREAM
TOPICAL_CREAM | Freq: Three times a day (TID) | CUTANEOUS | Status: DC
  Filled 2023-04-05: qty 1

## 2023-04-05 NOTE — Progress Notes (Signed)
Pharmacy Antibiotic Note  [name] is a 57 y.o. male admitted on 04/02/2023 with cellulitis. Pharmacy has been consulted for vancomycin and zosyn dosing.  Afebrile, WBC WNL, and procal < 0.10. Renal function has improved since initial vancomycin dosing, therefore will adjust accordingly.  Plan: Change vancomycin to 1750mg  IV Q36H (AUC 511.6, SCr 2.52, Vd 0.5) Continue Zosyn 3.375g IV Q8H (4 hr inf) F/u renal fxn, C&S, clinical status and peak/trough at SS  Height: 6' (182.9 cm) Weight: 136.1 kg (300 lb) IBW/kg (Calculated) : 77.6  Temp (24hrs), Avg:97.9 F (36.6 C), Min:97.7 F (36.5 C), Max:98 F (36.7 C)  Recent Labs  Lab 04/02/23 1552 04/02/23 1903 04/03/23 1046 04/04/23 0639 04/05/23 0315  WBC 9.2  --  6.3 7.3 7.8  CREATININE 3.14*  --  2.65* 2.83* 2.52*  LATICACIDVEN  --  1.8  --   --   --     Estimated Creatinine Clearance: 45.6 mL/min (A) (by C-G formula based on SCr of 2.52 mg/dL (H)).    Allergies  Allergen Reactions   Bee Venom Anaphylaxis   Trulicity [Dulaglutide] Anaphylaxis and Diarrhea    Extra dose of Trulicity caused cardiac arrest. Patient experience onset of diarrhea with using this medication - cleared after med stopped.    Antimicrobials this admission: Vanc 10/24>> Zosyn 10/24>> Fluconazole x1 10/25 Clinda x 1 10/24  Dose adjustments this admission: Vancomycin 1750mg  Q48H > 1750mg  Q36H  Microbiology results: 10/24 bcx ngtd3  Thank you for allowing pharmacy to be a part of this patient's care.  Nicole Kindred, PharmD PGY1 Pharmacy Resident 04/05/2023 9:48 AM

## 2023-04-05 NOTE — Progress Notes (Signed)
PROGRESS NOTE                                                                                                                                                                                                             Patient Demographics:    [name], is a 57 y.o. male, DOB - Nov 18, 1965, DGL:[id number]  Outpatient Primary MD for the patient is Marcine Matar, MD    LOS - 1  Admit date - 04/02/2023    Chief Complaint  Patient presents with   Rash    Jock itch   Hyperglycemia       Brief Narrative (HPI from H&P)   57 y.o. male with medical history significant of type 2 diabetes mellitus, nonischemic cardiomyopathy with chronic systolic CHF, ICD in place with prior shocks in the setting of VT, chronic kidney disease stage IIIb, COPD, obesity, comes to the hospital with complaints of groin pain, redness as well as foul-smelling odor from that area.  This was also associated with increased itching.  This has been going on for several days, he has tried some several home remedies without success.  Area has spread to his upper thigh areas as well as scrotum, came to the ER where workup was consistent with scrotal and groin cellulitis and he was admitted.   Subjective:    Richardson Kleinow today has, No headache, No chest pain, No abdominal pain - No Nausea, No new weakness tingling or numbness, no SOB, improved groin discomfort.   Assessment  & Plan :   Groin cellulitis - patient is afebrile, has no leukocytosis or elevated lactic acid.  He is nontoxic-appearing.  CT scan reassuring without subcutaneous gas or findings concerning for Fournier's gangrene, due to his poorly controlled diabetes mellitus and the site of his cellulitis he remains at risk for developing Fournier's gangrene hence we will continue IV antibiotics, cellulitis much improved, keep the site clean and dry, nystatin powder 3 times daily and monitor.  If  improvement continues likely discharge in the next 1 to 2 days..   Chronic systolic CHF, NICM, ICD in place, history of V. Tach - noted, currently patient appears euvolemic.  No recent shocks.   -Most recent cardiac cath December 2023 without obstructive CAD -Most recent 2D echo March 2024 shows LVEF 20 to 25%, global hypokinesis, grade 1 diastolic  dysfunction.  RV was normal   Will continue home amiodarone, resume home dose metoprolol, blood pressure still soft with AKI hence holding Entresto-Aldactone and Lasix.      Acute kidney injury on chronic kidney disease stage IIIb -baseline creatinine around 2.5, most recent 1 as an outpatient at 2.7.  On admission creatinine was 3.14, returned to baseline on most recent labs.  Resume Entresto Aldactone and Lasix on 04/06/2023.   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  He would benefit from weight loss, follow with PCP.   Hyperlipidemia-continue Lipitor   Type 2 diabetes mellitus, insulin-dependent, poorly controlled and with hyperglycemia -adjusted glargine dose on 04/04/2023, continue sliding scale and monitor, poor outpatient glycemic control. Continue to monitor  Lab Results  Component Value Date   HGBA1C 9.3 (H) 04/03/2023   CBG (last 3)  Recent Labs    04/04/23 1529 04/04/23 2148 04/05/23 0820  GLUCAP 183* 222* 156*        Condition - Fair  Family Communication  :  None  Code Status :  Full  Consults  :    PUD Prophylaxis :     Procedures  :     CT -  Minimal edematous changes in the perineum extrinsic to the right consistent with the given clinical history. No subcutaneous air or abscess is noted to suggest Fournier's gangrene. No definitive skin breakdown is noted. Stable adrenal lesions bilaterally dating back to 2019. Consistent with adenomas and no further follow-up is recommended. Diverticulosis without diverticulitis.       Disposition Plan  :    Status is: Observation  DVT Prophylaxis  :  Lovenox   Lab Results  Component Value Date   PLT 271 04/05/2023    Diet :  Diet Order             Diet heart healthy/carb modified Room service appropriate? Yes; Fluid consistency: Thin  Diet effective now                    Inpatient Medications  Scheduled Meds:  acetaminophen  1,000 mg Oral BID   amiodarone  200 mg Oral Daily   aspirin EC  81 mg Oral Daily   atorvastatin  10 mg Oral Daily   clonazePAM  0.5 mg Oral BID   enoxaparin (LOVENOX) injection  70 mg Subcutaneous Q24H   fluticasone furoate-vilanterol  1 puff Inhalation Daily   And   umeclidinium bromide  1 puff Inhalation Daily   gabapentin  300 mg Oral QHS   insulin aspart  0-5 Units Subcutaneous QHS   insulin aspart  0-9 Units Subcutaneous TID WC   insulin aspart  4 Units Subcutaneous TID WC   insulin glargine-yfgn  35 Units Subcutaneous QHS   methocarbamol  500 mg Oral Daily   metoprolol succinate  50 mg Oral Daily   nystatin   Topical TID   sertraline  50 mg Oral Daily   spironolactone  12.5 mg Oral Daily   tamsulosin  0.8 mg Oral Daily   Continuous Infusions:  piperacillin-tazobactam (ZOSYN)  IV 3.375 g (04/05/23 0901)   vancomycin 1,750 mg (04/04/23 2159)   PRN Meds:.acetaminophen **OR** acetaminophen, ipratropium-albuterol, loperamide, ondansetron **OR** ondansetron (ZOFRAN) IV     Objective:   Vitals:   04/04/23 2031 04/05/23 0124 04/05/23 0425 04/05/23 0830  BP: 128/76 115/60 (!) 116/92 (!) 145/74  Pulse: 64 64 64 64  Resp: 17  15 12   Temp: 97.8 F (36.6 C) 97.9 F (36.6 C) 98 F (36.7 C) 97.7 F (36.5 C)  TempSrc: Oral Oral Oral Oral  SpO2: 92% 95% 94%   Weight:      Height:        Wt Readings from Last 3 Encounters:  04/03/23 136.1 kg  03/17/23 136.1 kg  03/16/23 132.5 kg     Intake/Output Summary (Last 24 hours) at 04/05/2023 0948 Last data filed at 04/04/2023 2200 Gross per 24 hour  Intake  --  Output 400 ml  Net -400 ml     Physical Exam  Awake Alert, No new F.N deficits, Normal affect [place].AT,PERRAL Supple Neck, No JVD,   Symmetrical Chest wall movement, Good air movement bilaterally, CTAB RRR,No Gallops,Rubs or new Murmurs,  +ve B.Sounds, Abd Soft, No tenderness,   Perineal cellulitis site stable, no fluctuance      Data Review:    Recent Labs  Lab 04/02/23 1552 04/03/23 1046 04/04/23 0639 04/05/23 0315  WBC 9.2 6.3 7.3 7.8  HGB 14.0 13.6 14.8 13.6  HCT 42.4 41.5 45.7 41.6  PLT 262 235 211 271  MCV 94.6 95.4 97.6 94.1  MCH 31.3 31.3 31.6 30.8  MCHC 33.0 32.8 32.4 32.7  RDW 14.5 14.5 14.5 14.2  LYMPHSABS  --  1.9 2.4 2.5  MONOABS  --  0.6 0.6 0.7  EOSABS  --  0.1 0.1 0.2  BASOSABS  --  0.0 0.1 0.1    Recent Labs  Lab 04/02/23 1552 04/02/23 1903 04/03/23 0854 04/03/23 1046 04/04/23 0639 04/04/23 1031 04/05/23 0315  NA 134*  --   --  141 139  --  140  K 4.0  --   --  4.0 4.5  --  4.3  CL 97*  --   --  105 100  --  105  CO2 26  --   --  27 26  --  26  ANIONGAP 11  --   --  9 13  --  9  GLUCOSE 455*  --   --  148* 199*  --  146*  BUN 44*  --   --  41* 42*  --  43*  CREATININE 3.14*  --   --  2.65* 2.83*  --  2.52*  AST  --   --   --  18  --   --   --   ALT  --   --   --  24  --   --   --   ALKPHOS  --   --   --  64  --   --   --   BILITOT  --   --   --  0.5  --   --   --   ALBUMIN  --   --   --  4.1  --   --   --   CRP  --   --   --   --  1.6*  --  1.2*  PROCALCITON  --   --   --   --  0.11  --  <0.10  LATICACIDVEN  --  1.8  --   --   --   --   --   INR  --   --   --   --  1.1  --   --   HGBA1C  --   --  9.3*  --   --   --   --  BNP  --   --   --   --   --  340.6* 57.4  MG  --   --   --   --  2.8*  --  2.5*  CALCIUM 9.9  --   --  9.2 9.2  --  9.6      Recent Labs  Lab 04/02/23 1552 04/02/23 1903 04/03/23 0854 04/03/23 1046 04/04/23 0639 04/04/23 1031 04/05/23 0315  CRP  --   --   --   --  1.6*  --  1.2*  PROCALCITON  --    --   --   --  0.11  --  <0.10  LATICACIDVEN  --  1.8  --   --   --   --   --   INR  --   --   --   --  1.1  --   --   HGBA1C  --   --  9.3*  --   --   --   --   BNP  --   --   --   --   --  340.6* 57.4  MG  --   --   --   --  2.8*  --  2.5*  CALCIUM 9.9  --   --  9.2 9.2  --  9.6    --------------------------------------------------------------------------------------------------------------- Lab Results  Component Value Date   CHOL 166 02/13/2023   HDL 44 02/13/2023   LDLCALC 76 02/13/2023   TRIG 283 (H) 02/13/2023   CHOLHDL 3.8 02/13/2023    Lab Results  Component Value Date   HGBA1C 9.3 (H) 04/03/2023   No results for input(s): "TSH", "T4TOTAL", "FREET4", "T3FREE", "THYROIDAB" in the last 72 hours. No results for input(s): "VITAMINB12", "FOLATE", "FERRITIN", "TIBC", "IRON", "RETICCTPCT" in the last 72 hours. ------------------------------------------------------------------------------------------------------------------ Cardiac Enzymes No results for input(s): "CKMB", "TROPONINI", "MYOGLOBIN" in the last 168 hours.  Invalid input(s): "CK"  Micro Results Recent Results (from the past 240 hour(s))  Blood culture (routine x 2)     Status: None (Preliminary result)   Collection Time: 04/02/23  6:33 PM   Specimen: BLOOD  Result Value Ref Range Status   Specimen Description   Final    BLOOD BLOOD LEFT FOREARM Performed at [facility], [address] [place], [place] [address]    Special Requests   Final    Blood Culture adequate volume BOTTLES DRAWN AEROBIC AND ANAEROBIC Performed at [facility], [address], [place], [place] [address]    Culture   Final    NO GROWTH 3 DAYS Performed at [facility], [address] [place], [place] [address]    Report Status PENDING  Incomplete  Blood culture (routine x 2)     Status: None (Preliminary result)   Collection Time: 04/02/23  6:38 PM   Specimen: BLOOD  Result Value  Ref Range Status   Specimen Description   Final    BLOOD BLOOD RIGHT FOREARM Performed at [facility], [address] [place], [place] [address]    Special Requests   Final    Blood Culture adequate volume BOTTLES DRAWN AEROBIC AND ANAEROBIC Performed at [facility], [address], [place], [place] [address]    Culture   Final    NO GROWTH 3 DAYS Performed at [facility], 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 RADIATION DOSE REDUCTION: This exam was performed according to the departmental dose-optimization program which includes automated exposure control, adjustment of the mA and/or kV according to patient size and/or use of iterative reconstruction technique. COMPARISON:  03/17/2023 FINDINGS: Lower chest: No acute abnormality. Hepatobiliary: No focal liver abnormality is seen. Status post cholecystectomy. No biliary dilatation. Pancreas: Unremarkable. No pancreatic ductal dilatation or surrounding inflammatory changes. Spleen: Normal in size without focal abnormality. Adrenals/Urinary Tract: Adrenal glands show no bilateral adrenal nodule stable in appearance from the prior exam. These are unchanged from 2019 and consistent with benign adenomas. No further follow-up is recommended. Kidneys are well visualized bilaterally. No renal calculi or obstructive changes are seen. The bladder is within normal limits. Stomach/Bowel: Scattered diverticular change of the colon is noted. No evidence of diverticulitis is seen. The appendix is within normal limits. Small bowel and stomach are  unremarkable. Vascular/Lymphatic: Aortic atherosclerosis. No enlarged abdominal or pelvic lymph nodes. Reproductive: Prostate is within normal limits. Other: Minimal edematous changes are noted in the perineum eccentric to the right best seen on the coronal images number 77 through 65 of series 5. No focal abscess is seen. No subcutaneous air is noted. No scrotal edema is seen. Musculoskeletal: No acute or significant osseous findings. IMPRESSION: Minimal edematous changes in the perineum extrinsic to the right consistent with the given clinical history. No subcutaneous air or abscess is noted to suggest Fournier's gangrene. No definitive skin breakdown is noted. Stable adrenal lesions bilaterally dating back to 2019. Consistent with adenomas and no further follow-up is recommended. Diverticulosis without diverticulitis. Electronically Signed   By: Alcide Clever M.D.   On: 04/02/2023 20:09      Signature  -   Susa Raring M.D on 04/05/2023 at 9:48 AM   -  To page go to [email or web address]

## 2023-04-05 NOTE — Plan of Care (Signed)

## 2023-04-05 NOTE — Consult Note (Signed)
WOC Nurse Consult Note: Reason for Consult: scrotal burning  Wound type: Intertriginous Dermatitis  ICD-10 CM Codes for Irritant Dermatitis  L30.4  - Erythema intertrigo. Also used for abrasion of the hand, chafing of the skin, dermatitis due to sweating and friction, friction dermatitis, friction eczema, and genital/thigh intertrigo.  Pressure Injury POA: NA  Measurement: widespread erythema and partial thickness skin loss to scrotum/inner thighs; one full thickness wound 0.5 cm x 0.5 cm x 0.1 cm R scrotum with 50% yellow 50% red moist tissue  Wound bed: as above  Drainage (amount, consistency, odor) minimal serosanguinous  Periwound:  erythema  Dressing procedure/placement/frequency: Cleanse scrotum and inner thighs with soap and water, dry and apply Gerhardt's Butt Cream to area 3 times a day and prn soiling.  Patient has been prescribed Nystatin powder.  I told him he could sprinkle this over Gerhardt's if desired.   POC discussed with patient. WOC team will not follow. Re-consult if further needs arise.   Thank you,    Priscella Mann MSN, RN-BC, [employer] [phone]

## 2023-04-06 ENCOUNTER — Encounter: Payer: Self-pay | Admitting: Physician Assistant

## 2023-04-06 DIAGNOSIS — L03315 Cellulitis of perineum: Secondary | ICD-10-CM | POA: Diagnosis not present

## 2023-04-06 LAB — GLUCOSE, CAPILLARY
Glucose-Capillary: 291 mg/dL — ABNORMAL HIGH (ref 70–99)
Glucose-Capillary: 187 mg/dL — ABNORMAL HIGH (ref 70–99)
Glucose-Capillary: 247 mg/dL — ABNORMAL HIGH (ref 70–99)
Glucose-Capillary: 246 mg/dL — ABNORMAL HIGH (ref 70–99)

## 2023-04-06 MED ORDER — SACUBITRIL-VALSARTAN 24-26 MG PO TABS
1.0000 | ORAL_TABLET | Freq: Two times a day (BID) | ORAL | Status: DC
  Administered 2023-04-06 – 2023-04-07 (×3): 1 via ORAL
  Filled 2023-04-06 (×3): qty 1

## 2023-04-06 MED ORDER — DOXYCYCLINE HYCLATE 100 MG PO TABS
100.0000 mg | ORAL_TABLET | Freq: Two times a day (BID) | ORAL | Status: DC
  Administered 2023-04-06 – 2023-04-07 (×3): 100 mg via ORAL
  Filled 2023-04-06 (×3): qty 1

## 2023-04-06 MED ORDER — TORSEMIDE 20 MG PO TABS
40.0000 mg | ORAL_TABLET | Freq: Every day | ORAL | Status: DC
  Administered 2023-04-06 – 2023-04-07 (×2): 40 mg via ORAL
  Filled 2023-04-06 (×2): qty 2

## 2023-04-06 NOTE — Plan of Care (Signed)
  Problem: Education: Goal: Knowledge of General Education information will improve Description: Including pain rating scale, medication(s)/side effects and non-pharmacologic comfort measures Outcome: Progressing   Problem: Health Behavior/Discharge Planning: Goal: Ability to manage health-related needs will improve Outcome: Progressing   Problem: Clinical Measurements: Goal: Respiratory complications will improve Outcome: Progressing Goal: Cardiovascular complication will be avoided Outcome: Progressing   Problem: Activity: Goal: Risk for activity intolerance will decrease Outcome: Progressing   Problem: Nutrition: Goal: Adequate nutrition will be maintained Outcome: Progressing   Problem: Coping: Goal: Level of anxiety will decrease Outcome: Progressing   Problem: Elimination: Goal: Will not experience complications related to bowel motility Outcome: Progressing Goal: Will not experience complications related to urinary retention Outcome: Progressing   Problem: Pain Management: Goal: General experience of comfort will improve Outcome: Progressing   Problem: Safety: Goal: Ability to remain free from injury will improve Outcome: Progressing   Problem: Skin Integrity: Goal: Risk for impaired skin integrity will decrease Outcome: Progressing

## 2023-04-06 NOTE — Plan of Care (Signed)

## 2023-04-06 NOTE — Plan of Care (Signed)
  Problem: Education: Goal: Knowledge of General Education information will improve Description: Including pain rating scale, medication(s)/side effects and non-pharmacologic comfort measures Outcome: Progressing   Problem: Health Behavior/Discharge Planning: Goal: Ability to manage health-related needs will improve Outcome: Progressing   Problem: Clinical Measurements: Goal: Will remain free from infection Outcome: Progressing Goal: Diagnostic test results will improve Outcome: Progressing   Problem: Education: Goal: Knowledge of General Education information will improve Description: Including pain rating scale, medication(s)/side effects and non-pharmacologic comfort measures Outcome: Progressing   Problem: Health Behavior/Discharge Planning: Goal: Ability to manage health-related needs will improve Outcome: Progressing   Problem: Clinical Measurements: Goal: Will remain free from infection Outcome: Progressing Goal: Diagnostic test results will improve Outcome: Progressing

## 2023-04-06 NOTE — Progress Notes (Signed)
PROGRESS NOTE                                                                                                                                                                                                             Patient Demographics:    [name], is a 57 y.o. male, DOB - 1966/05/20, ZOX:[id number]  Outpatient Primary MD for the patient is Marcine Matar, MD    LOS - 2  Admit date - 04/02/2023    Chief Complaint  Patient presents with   Rash    Jock itch   Hyperglycemia       Brief Narrative (HPI from H&P)   57 y.o. male with medical history significant of type 2 diabetes mellitus, nonischemic cardiomyopathy with chronic systolic CHF, ICD in place with prior shocks in the setting of VT, chronic kidney disease stage IIIb, COPD, obesity, comes to the hospital with complaints of groin pain, redness as well as foul-smelling odor from that area.  This was also associated with increased itching.  This has been going on for several days, he has tried some several home remedies without success.  Area has spread to his upper thigh areas as well as scrotum, came to the ER where workup was consistent with scrotal and groin cellulitis and he was admitted.   Subjective:   Patient in bed, appears comfortable, denies any headache, no fever, no chest pain or pressure, no shortness of breath , no abdominal pain. No focal weakness.  Improved groin discomfort.   Assessment  & Plan :   Groin cellulitis - patient is afebrile, has no leukocytosis or elevated lactic acid.  He is nontoxic-appearing.  CT scan reassuring without subcutaneous gas or findings concerning for Fournier's gangrene, due to his poorly controlled diabetes mellitus and the site of his cellulitis he remains at risk for developing Fournier's gangrene hence we will continue IV antibiotics, cellulitis much improved, keep the site clean and dry, nystatin powder 3 times  daily and monitor.  If improvement continues likely discharge in the next 1 to 2 days..   Chronic systolic CHF, NICM, ICD in place, history of V. Tach - noted, currently patient appears euvolemic.  No recent shocks.   -Most recent cardiac cath December 2023 without obstructive CAD -Most recent 2D echo March 2024 shows LVEF 20 to 25%, global hypokinesis,  grade 1 diastolic dysfunction.  RV was normal   Will continue home amiodarone, resume home dose metoprolol, pressure is improved Aldactone and Demadex started, resume low-dose Entresto.   Acute kidney injury on chronic kidney disease stage IIIb -baseline creatinine around 2.5, most recent 1 as an outpatient at 2.7.  On admission creatinine was 3.14, returned to baseline on most recent labs.     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  He would benefit from weight loss, follow with PCP.   Hyperlipidemia-continue Lipitor   Type 2 diabetes mellitus, insulin-dependent, poorly controlled and with hyperglycemia -adjusted glargine dose on 04/04/2023, continue sliding scale and monitor, poor outpatient glycemic control. Continue to monitor  Lab Results  Component Value Date   HGBA1C 9.3 (H) 04/03/2023   CBG (last 3)  Recent Labs    04/05/23 1148 04/05/23 1629 04/05/23 2136  GLUCAP 186* 193* 246*        Condition - Fair  Family Communication  :  None  Code Status :  Full  Consults  :    PUD Prophylaxis :     Procedures  :     CT -  Minimal edematous changes in the perineum extrinsic to the right consistent with the given clinical history. No subcutaneous air or abscess is noted to suggest Fournier's gangrene. No definitive skin breakdown is noted. Stable adrenal lesions bilaterally dating back to 2019. Consistent with adenomas and no further follow-up is recommended. Diverticulosis without diverticulitis.      Disposition Plan  :     Status is: Observation  DVT Prophylaxis  :  Lovenox   Lab Results  Component Value Date   PLT 271 04/05/2023    Diet :  Diet Order             Diet heart healthy/carb modified Room service appropriate? Yes; Fluid consistency: Thin  Diet effective now                    Inpatient Medications  Scheduled Meds:  acetaminophen  1,000 mg Oral BID   amiodarone  200 mg Oral Daily   aspirin EC  81 mg Oral Daily   atorvastatin  10 mg Oral Daily   clonazePAM  0.5 mg Oral BID   enoxaparin (LOVENOX) injection  70 mg Subcutaneous Q24H   fluticasone furoate-vilanterol  1 puff Inhalation Daily   And   umeclidinium bromide  1 puff Inhalation Daily   gabapentin  300 mg Oral QHS   Gerhardt's butt cream   Topical TID   insulin aspart  0-5 Units Subcutaneous QHS   insulin aspart  0-9 Units Subcutaneous TID WC   insulin aspart  4 Units Subcutaneous TID WC   insulin glargine-yfgn  35 Units Subcutaneous QHS   methocarbamol  500 mg Oral Daily   metoprolol succinate  50 mg Oral Daily   nystatin   Topical TID   sertraline  50 mg Oral Daily   spironolactone  12.5 mg Oral Daily   tamsulosin  0.8 mg Oral Daily   torsemide  40 mg Oral Daily   Continuous Infusions:  piperacillin-tazobactam (ZOSYN)  IV 3.375 g (04/06/23 0128)   vancomycin     PRN Meds:.acetaminophen **OR** acetaminophen, ipratropium-albuterol, loperamide, ondansetron **OR** ondansetron (ZOFRAN) IV     Objective:   Vitals:   04/05/23 2000 04/06/23 0046 04/06/23 0417 04/06/23 0842  BP: 114/71 134/68 121/74   Pulse: 71  64 64   Resp: 15 20 15 18   Temp: 98 F (36.7 C) 98.2 F (36.8 C) 98 F (36.7 C)   TempSrc: Oral Oral Oral   SpO2: 92% (!) 89% 94% 93%  Weight:      Height:        Wt Readings from Last 3 Encounters:  04/03/23 136.1 kg  03/17/23 136.1 kg  03/16/23 132.5 kg    No intake or output data in the 24 hours ending 04/06/23 0856    Physical Exam  Awake Alert, No new F.N deficits, Normal  affect [place].AT,PERRAL Supple Neck, No JVD,   Symmetrical Chest wall movement, Good air movement bilaterally, CTAB RRR,No Gallops,Rubs or new Murmurs,  +ve B.Sounds, Abd Soft, No tenderness,   Perineal cellulitis site stable, no fluctuance      Data Review:    Recent Labs  Lab 04/02/23 1552 04/03/23 1046 04/04/23 0639 04/05/23 0315  WBC 9.2 6.3 7.3 7.8  HGB 14.0 13.6 14.8 13.6  HCT 42.4 41.5 45.7 41.6  PLT 262 235 211 271  MCV 94.6 95.4 97.6 94.1  MCH 31.3 31.3 31.6 30.8  MCHC 33.0 32.8 32.4 32.7  RDW 14.5 14.5 14.5 14.2  LYMPHSABS  --  1.9 2.4 2.5  MONOABS  --  0.6 0.6 0.7  EOSABS  --  0.1 0.1 0.2  BASOSABS  --  0.0 0.1 0.1    Recent Labs  Lab 04/02/23 1552 04/02/23 1903 04/03/23 0854 04/03/23 1046 04/04/23 0639 04/04/23 1031 04/05/23 0315  NA 134*  --   --  141 139  --  140  K 4.0  --   --  4.0 4.5  --  4.3  CL 97*  --   --  105 100  --  105  CO2 26  --   --  27 26  --  26  ANIONGAP 11  --   --  9 13  --  9  GLUCOSE 455*  --   --  148* 199*  --  146*  BUN 44*  --   --  41* 42*  --  43*  CREATININE 3.14*  --   --  2.65* 2.83*  --  2.52*  AST  --   --   --  18  --   --   --   ALT  --   --   --  24  --   --   --   ALKPHOS  --   --   --  64  --   --   --   BILITOT  --   --   --  0.5  --   --   --   ALBUMIN  --   --   --  4.1  --   --   --   CRP  --   --   --   --  1.6*  --  1.2*  PROCALCITON  --   --   --   --  0.11  --  <0.10  LATICACIDVEN  --  1.8  --   --   --   --   --   INR  --   --   --   --  1.1  --   --   HGBA1C  --   --  9.3*  --   --   --   --   BNP  --   --   --   --   --  340.6* 57.4  MG  --   --   --   --  2.8*  --  2.5*  CALCIUM 9.9  --   --  9.2 9.2  --  9.6      Recent Labs  Lab 04/02/23 1552 04/02/23 1903 04/03/23 0854 04/03/23 1046 04/04/23 0639 04/04/23 1031 04/05/23 0315  CRP  --   --   --   --  1.6*  --  1.2*  PROCALCITON  --   --   --   --  0.11  --  <0.10  LATICACIDVEN  --  1.8  --   --   --   --   --   INR  --   --   --    --  1.1  --   --   HGBA1C  --   --  9.3*  --   --   --   --   BNP  --   --   --   --   --  340.6* 57.4  MG  --   --   --   --  2.8*  --  2.5*  CALCIUM 9.9  --   --  9.2 9.2  --  9.6    --------------------------------------------------------------------------------------------------------------- Lab Results  Component Value Date   CHOL 166 02/13/2023   HDL 44 02/13/2023   LDLCALC 76 02/13/2023   TRIG 283 (H) 02/13/2023   CHOLHDL 3.8 02/13/2023    Lab Results  Component Value Date   HGBA1C 9.3 (H) 04/03/2023   No results for input(s): "TSH", "T4TOTAL", "FREET4", "T3FREE", "THYROIDAB" in the last 72 hours. No results for input(s): "VITAMINB12", "FOLATE", "FERRITIN", "TIBC", "IRON", "RETICCTPCT" in the last 72 hours. ------------------------------------------------------------------------------------------------------------------ Cardiac Enzymes No results for input(s): "CKMB", "TROPONINI", "MYOGLOBIN" in the last 168 hours.  Invalid input(s): "CK"  Micro Results Recent Results (from the past 240 hour(s))  Blood culture (routine x 2)     Status: None (Preliminary result)   Collection Time: 04/02/23  6:33 PM   Specimen: BLOOD  Result Value Ref Range Status   Specimen Description   Final    BLOOD BLOOD LEFT FOREARM Performed at [facility], [address], [place], [place] [address]    Special Requests   Final    Blood Culture adequate volume BOTTLES DRAWN AEROBIC AND ANAEROBIC Performed at [facility], [address] [place], [place] [address]    Culture   Final    NO GROWTH 4 DAYS Performed at [facility], [address], [place], [place] [address]    Report Status PENDING  Incomplete  Blood culture (routine x 2)     Status: None (Preliminary result)   Collection Time: 04/02/23  6:38 PM   Specimen: BLOOD  Result Value Ref Range Status   Specimen Description   Final    BLOOD BLOOD RIGHT FOREARM Performed at [facility], [address], [place], [place] [address]    Special Requests   Final    Blood Culture adequate volume BOTTLES DRAWN AEROBIC AND ANAEROBIC Performed at [facility], [address], [place], [place] [address]    Culture   Final    NO GROWTH 4 DAYS Performed at [facility], 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 RADIATION DOSE REDUCTION: This exam was performed according to the departmental dose-optimization program which includes automated exposure control, adjustment of the mA and/or kV according to patient size and/or use of iterative reconstruction technique. COMPARISON:  03/17/2023 FINDINGS: Lower chest: No acute abnormality. Hepatobiliary: No focal liver abnormality is seen. Status post cholecystectomy. No biliary dilatation. Pancreas: Unremarkable. No pancreatic ductal dilatation or surrounding inflammatory changes. Spleen: Normal in size without focal abnormality. Adrenals/Urinary Tract: Adrenal glands show no bilateral adrenal nodule stable in appearance from the prior exam. These are unchanged from 2019 and consistent with benign adenomas. No further follow-up is recommended. Kidneys are well visualized bilaterally. No renal calculi or obstructive changes are seen. The bladder is within normal limits. Stomach/Bowel: Scattered diverticular change of the colon is noted. No evidence of diverticulitis is seen. The appendix is within normal limits. Small bowel and stomach are unremarkable. Vascular/Lymphatic: Aortic atherosclerosis. No enlarged abdominal or pelvic lymph nodes.  Reproductive: Prostate is within normal limits. Other: Minimal edematous changes are noted in the perineum eccentric to the right best seen on the coronal images number 77 through 65 of series 5. No focal abscess is seen. No subcutaneous air is noted. No scrotal edema is seen. Musculoskeletal: No acute or significant osseous findings. IMPRESSION: Minimal edematous changes in the perineum extrinsic to the right consistent with the given clinical history. No subcutaneous air or abscess is noted to suggest Fournier's gangrene. No definitive skin breakdown is noted. Stable adrenal lesions bilaterally dating back to 2019. Consistent with adenomas and no further follow-up is recommended. Diverticulosis without diverticulitis. Electronically Signed   By: Alcide Clever M.D.   On: 04/02/2023 20:09      Signature  -   Susa Raring M.D on 04/06/2023 at 8:56 AM   -  To page go to [email or web address]

## 2023-04-07 ENCOUNTER — Other Ambulatory Visit (HOSPITAL_COMMUNITY): Payer: Self-pay

## 2023-04-07 DIAGNOSIS — L03315 Cellulitis of perineum: Secondary | ICD-10-CM | POA: Diagnosis not present

## 2023-04-07 LAB — GLUCOSE, CAPILLARY
Glucose-Capillary: 184 mg/dL — ABNORMAL HIGH (ref 70–99)
Glucose-Capillary: 338 mg/dL — ABNORMAL HIGH (ref 70–99)

## 2023-04-07 MED ORDER — DOXYCYCLINE HYCLATE 100 MG PO TABS
100.0000 mg | ORAL_TABLET | Freq: Two times a day (BID) | ORAL | 0 refills | Status: DC
  Filled 2023-04-07: qty 10, 5d supply, fill #0

## 2023-04-07 MED ORDER — NYSTATIN 100000 UNIT/GM EX POWD
Freq: Three times a day (TID) | CUTANEOUS | 0 refills | Status: DC
  Filled 2023-04-07: qty 15, 30d supply, fill #0

## 2023-04-07 MED ORDER — TRIAMCINOLONE ACETONIDE 40 MG/ML IJ SUSP
40.0000 mg | INTRAMUSCULAR | Status: AC | PRN
  Administered 2023-03-30: 40 mg via INTRA_ARTICULAR

## 2023-04-07 MED ORDER — LANTUS SOLOSTAR 100 UNIT/ML ~~LOC~~ SOPN: 42.0000 [IU] | PEN_INJECTOR | Freq: Every day | SUBCUTANEOUS | Status: DC

## 2023-04-07 MED ORDER — BUPIVACAINE HCL 0.5 % IJ SOLN
2.0000 mL | INTRAMUSCULAR | Status: AC | PRN
  Administered 2023-03-30: 2 mL via INTRA_ARTICULAR

## 2023-04-07 MED ORDER — LOPERAMIDE HCL 2 MG PO CAPS
2.0000 mg | ORAL_CAPSULE | ORAL | 0 refills | Status: DC | PRN
  Filled 2023-04-07: qty 12, 12d supply, fill #0

## 2023-04-07 NOTE — Discharge Instructions (Signed)
Keep your groin, scrotal area clean and dry at all times.  Follow with Primary MD Marcine Matar, MD in 7 days   Get CBC, CMP, DCM-  checked next visit with your primary MD    Activity: As tolerated with Full fall precautions use walker/cane & assistance as needed  Disposition Home   Diet: Heart Healthy low carbohydrate diet, 1.5 L fluid restriction per day.  Check CBGs q. ACH S.  Check your Weight same time everyday, if you gain over 2 pounds, or you develop in leg swelling, experience more shortness of breath or chest pain, call your Primary MD immediately. Follow Cardiac Low Salt Diet and 1.5 lit/day fluid restriction.  Special Instructions: If you have smoked or chewed Tobacco  in the last 2 yrs please stop smoking, stop any regular Alcohol  and or any Recreational drug use.  On your next visit with your primary care physician please Get Medicines reviewed and adjusted.  Please request your Prim.MD to go over all Hospital Tests and Procedure/Radiological results at the follow up, please get all Hospital records sent to your Prim MD by signing hospital release before you go home.  If you experience worsening of your admission symptoms, develop shortness of breath, life threatening emergency, suicidal or homicidal thoughts you must seek medical attention immediately by calling 911 or calling your MD immediately  if symptoms less severe.  You Must read complete instructions/literature along with all the possible adverse reactions/side effects for all the Medicines you take and that have been prescribed to you. Take any new Medicines after you have completely understood and accpet all the possible adverse reactions/side effects.   Do not drive when taking Pain medications.  Do not take more than prescribed Pain, Sleep and Anxiety Medications

## 2023-04-07 NOTE — Plan of Care (Signed)

## 2023-04-07 NOTE — TOC Transition Note (Signed)
Transition of Care [facility]) - CM/SW Discharge Note   Patient Details  Name: [name] MRN: [id number] Date of Birth: 21-Jan-1966  [facility] CM/SW Contact:  Gordy Clement, RN Phone Number: 04/07/2023, 9:34 AM   Clinical Narrative:    Patient to DC to  home. No TOC needs  Family to transport           Patient Goals and CMS Choice      Discharge Placement                         Discharge Plan and Services Additional resources added to the After Visit Summary for                                       Social Determinants of Health (SDOH) Interventions SDOH Screenings   Food Insecurity: No Food Insecurity (03/24/2023)  Housing: Low Risk  (03/24/2023)  Transportation Needs: No Transportation Needs (03/24/2023)  Utilities: At Risk (03/24/2023)  Alcohol Screen: Low Risk  (10/13/2018)  Depression (PHQ2-9): Medium Risk (02/13/2023)  Financial Resource Strain: High Risk (03/24/2023)  Social Connections: Moderately Integrated (03/12/2021)  Tobacco Use: Medium Risk (04/02/2023)     Readmission Risk Interventions     No data to display

## 2023-04-07 NOTE — Discharge Summary (Signed)
[name] UYQ:[id number] DOB: February 08, 1966 DOA: 04/02/2023  PCP: Marcine Matar, MD  Admit date: 04/02/2023  Discharge date: 04/07/2023  Admitted From: Home   Disposition:  Home   Recommendations for Outpatient Follow-up:   Follow up with PCP in 1-2 weeks  PCP Please obtain BMP/CBC, 2 view CXR in 1week,  (see Discharge instructions)   PCP Please follow up on the following pending results:    Home Health: None   Equipment/Devices: None  Consultations: None  Discharge Condition: Stable    CODE STATUS: Full    Diet Recommendation: Heart Healthy low carbohydrate diet with 1.5 L fluid restriction.    Chief Complaint  Patient presents with   Rash    Jock itch   Hyperglycemia     Brief history of present illness from the day of admission and additional interim summary    57 y.o. male with medical history significant of type 2 diabetes mellitus, nonischemic cardiomyopathy with chronic systolic CHF, ICD in place with prior shocks in the setting of VT, chronic kidney disease stage IIIb, COPD, obesity, comes to the hospital with complaints of groin pain, redness as well as foul-smelling odor from that area.  This was also associated with increased itching.  This has been going on for several days, he has tried some several home remedies without success.  Area has spread to his upper thigh areas as well as scrotum, came to the ER where workup was consistent with scrotal and groin cellulitis and he was admitted.                                                                  Hospital Course     Groin cellulitis - patient is afebrile, has no leukocytosis or elevated lactic acid.  He is nontoxic-appearing.  CT scan reassuring without subcutaneous gas or findings concerning for Fournier's gangrene, due to his  poorly controlled diabetes mellitus and the site of his cellulitis he was at risk for developing Fournier's gangrene.  He was placed on empiric IV antibiotics but the mainstay of treatment was to keep his groin clean and dry, moisture was playing a huge role in his cellulitis, with keeping his groin clean and dry with nystatin powder and oral antibiotics his cellulitis has almost completely resolved and he is symptom-free, will be discharged on 5 more days of oral doxycycline, nystatin powder with instructions to keep the sected area clean and dry at all times.  Follow with PCP for follow-up within a week.    Chronic systolic CHF, NICM, ICD in place, history of V. Tach - noted, currently patient appears euvolemic.  No recent shocks.    -Most recent cardiac cath December 2023 without obstructive CAD -Most recent 2D echo March 2024 shows LVEF 20 to 25%, global hypokinesis,  grade 1 diastolic dysfunction.  RV was normal.  Compensated.   Will continue home regimen now which includes amiodarone, metoprolol, Entresto, Aldactone and Demadex.    Acute kidney injury on chronic kidney disease stage IIIb -baseline creatinine around 2.5, most recent 1 as an outpatient at 2.7.  On admission creatinine was 3.14, returned to baseline on most recent labs.  Home regimen resumed follow with PCP.   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  He would benefit from weight loss, follow with PCP.   Hyperlipidemia-continue Lipitor   Type 2 diabetes mellitus, insulin-dependent, poorly controlled and with hyperglycemia -adjusted glargine dose here and upon discharge for better control, continue home sliding scale, requested to check CBGs q. ACH S and follow-up with PCP for glycemic control.  He had poor outpatient glycemic control as suggested by high A1c.  Lab Results  Component Value Date   HGBA1C 9.3 (H) 04/03/2023   CBG (last  3)  Recent Labs    04/06/23 1552 04/06/23 2118 04/07/23 0714  GLUCAP 247* 246* 184*    Discharge diagnosis     Principal Problem:   Cellulitis of perineum    Discharge instructions    Discharge Instructions     Discharge instructions   Complete by: As directed    Keep your groin, scrotal area clean and dry at all times.  Follow with Primary MD Marcine Matar, MD in 7 days   Get CBC, CMP, DCM-  checked next visit with your primary MD    Activity: As tolerated with Full fall precautions use walker/cane & assistance as needed  Disposition Home   Diet: Heart Healthy low carbohydrate diet, 1.5 L fluid restriction per day.  Check CBGs q. ACH S.  Check your Weight same time everyday, if you gain over 2 pounds, or you develop in leg swelling, experience more shortness of breath or chest pain, call your Primary MD immediately. Follow Cardiac Low Salt Diet and 1.5 lit/day fluid restriction.  Special Instructions: If you have smoked or chewed Tobacco  in the last 2 yrs please stop smoking, stop any regular Alcohol  and or any Recreational drug use.  On your next visit with your primary care physician please Get Medicines reviewed and adjusted.  Please request your Prim.MD to go over all Hospital Tests and Procedure/Radiological results at the follow up, please get all Hospital records sent to your Prim MD by signing hospital release before you go home.  If you experience worsening of your admission symptoms, develop shortness of breath, life threatening emergency, suicidal or homicidal thoughts you must seek medical attention immediately by calling 911 or calling your MD immediately  if symptoms less severe.  You Must read complete instructions/literature along with all the possible adverse reactions/side effects for all the Medicines you take and that have been prescribed to you. Take any new Medicines after you have completely understood and accpet all the possible adverse  reactions/side effects.   Do not drive when taking Pain medications.  Do not take more than prescribed Pain, Sleep and Anxiety Medications   Increase activity slowly   Complete by: As directed        Discharge Medications   Allergies as of 04/07/2023       Reactions   Bee Venom Anaphylaxis   Trulicity [dulaglutide] Anaphylaxis, Diarrhea   Extra dose of Trulicity caused cardiac arrest. Patient experience onset of diarrhea with using this medication -  cleared after med stopped.        Medication List     STOP taking these medications    clobetasol cream 0.05 % Commonly known as: TEMOVATE   ketoconazole 2 % cream Commonly known as: NIZORAL   miconazole 2 % cream Commonly known as: Micatin       TAKE these medications    Accu-Chek Guide test strip Generic drug: glucose blood Use as instructed   FREESTYLE LITE test strip Generic drug: glucose blood Use as instructed.  Please make sure this is compatible with [facility] 2 reader.   Accu-Chek Guide w/Device Kit UAD   Blood Glucose Monitoring Suppl Devi 1 each by Does not apply route in the morning, at noon, and at bedtime. May substitute to any manufacturer covered by patient's insurance.   accu-chek softclix lancets 1 each by Other route 3 (three) times daily.   Accu-Chek Softclix Lancets lancets Use as instructed   acetaminophen 500 MG tablet Commonly known as: TYLENOL Take 1,000 mg by mouth every 8 (eight) hours as needed.   albuterol 108 (90 Base) MCG/ACT inhaler Commonly known as: VENTOLIN HFA Inhale 2 puffs into the lungs every 6 (six) hours as needed for wheezing or shortness of breath.   amiodarone 200 MG tablet Commonly known as: PACERONE Take 1 tablet (200 mg total) by mouth daily.   aspirin 81 MG tablet Take 81 mg by mouth daily.   atorvastatin 10 MG tablet Commonly known as: LIPITOR Take 1 tablet (10 mg total) by mouth daily.   B-D ULTRAFINE III SHORT PEN 31G X 8 MM Misc Generic drug:  Insulin Pen Needle USE AS DIRECTED   cetirizine 10 MG tablet Commonly known as: EQ Allergy Relief (Cetirizine) Take 1 tablet (10 mg total) by mouth daily.   cholecalciferol 25 MCG (1000 UNIT) tablet Commonly known as: VITAMIN D3 Take 1,000 Units by mouth daily.   clonazePAM 0.5 MG tablet Commonly known as: KlonoPIN Take 0.5 tablets (0.25 mg total) by mouth daily AND 1 tablet (0.5 mg total) at bedtime. What changed: See the new instructions.   dapagliflozin propanediol 10 MG Tabs tablet Commonly known as: Farxiga Take 1 tablet (10 mg total) by mouth daily before breakfast.   doxycycline 100 MG tablet Commonly known as: VIBRA-TABS Take 1 tablet (100 mg total) by mouth every 12 (twelve) hours.   Entresto 24-26 MG Generic drug: sacubitril-valsartan Take 1 tablet by mouth 2 (two) times daily.   EPINEPHrine 0.3 mg/0.3 mL Soaj injection Commonly known as: EPI-PEN Inject 0.3 mg into the muscle as needed.   famotidine 20 MG tablet Commonly known as: PEPCID TAKE 1 TABLET BY MOUTH 1 TO 2 TIMES DAILY AS NEEDED What changed:  how much to take how to take this when to take this reasons to take this   fluticasone 50 MCG/ACT nasal spray Commonly known as: FLONASE Use 1-2 sprays in each nostril once a day as needed for nasal congestion.   FreeStyle Libre 2 Reader Hardie Pulley USE AS DIRECTED   FreeStyle Libre 2 Sensor Misc Use to check blood sugar TID. Change sensor once every 14 days. E11.65   gabapentin 300 MG capsule Commonly known as: NEURONTIN Take 1 capsule by mouth at bedtime   insulin aspart 100 UNIT/ML injection Commonly known as: novoLOG Inject 24 Units into the skin 3 (three) times daily with meals.   Lantus SoloStar 100 UNIT/ML Solostar Pen Generic drug: insulin glargine Inject 42 Units into the skin daily. What changed: how much to take  loperamide 2 MG capsule Commonly known as: IMODIUM Take 1 capsule (2 mg total) by mouth as needed for diarrhea or loose  stools.   methocarbamol 500 MG tablet Commonly known as: ROBAXIN Take 500 mg by mouth daily.   metoprolol succinate 50 MG 24 hr tablet Commonly known as: TOPROL-XL Take 1 tablet (50 mg total) by mouth daily.   nystatin powder Commonly known as: MYCOSTATIN/NYSTOP Apply topically 3 (three) times daily.   sertraline 50 MG tablet Commonly known as: Zoloft Take 1 tablet (50 mg total) by mouth daily.   spironolactone 25 MG tablet Commonly known as: ALDACTONE Take 0.5 tablets (12.5 mg total) by mouth daily.   tamsulosin 0.4 MG Caps capsule Commonly known as: FLOMAX Take 2 capsules (0.8 mg total) by mouth daily.   torsemide 20 MG tablet Commonly known as: DEMADEX Take 4 tablets (80 mg total) by mouth daily.   TRELEGY ELLIPTA IN Inhale 1 puff into the lungs daily.         Follow-up Information     Marcine Matar, MD. Schedule an appointment as soon as possible for a visit in 1 week(s).   Specialty: Internal Medicine Contact information: [address] [place] [address] [phone]                 Major procedures and Radiology Reports - PLEASE review detailed and final reports thoroughly  -      CT ABDOMEN PELVIS WO CONTRAST  Result Date: 04/02/2023 CLINICAL DATA:  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 RADIATION DOSE REDUCTION: This exam was performed according to the departmental dose-optimization program which includes automated exposure control, adjustment of the mA and/or kV according to patient size and/or use of iterative reconstruction technique. COMPARISON:  03/17/2023 FINDINGS: Lower chest: No acute abnormality. Hepatobiliary: No focal liver abnormality is seen. Status post cholecystectomy. No biliary dilatation. Pancreas: Unremarkable. No  pancreatic ductal dilatation or surrounding inflammatory changes. Spleen: Normal in size without focal abnormality. Adrenals/Urinary Tract: Adrenal glands show no bilateral adrenal nodule stable in appearance from the prior exam. These are unchanged from 2019 and consistent with benign adenomas. No further follow-up is recommended. Kidneys are well visualized bilaterally. No renal calculi or obstructive changes are seen. The bladder is within normal limits. Stomach/Bowel: Scattered diverticular change of the colon is noted. No evidence of diverticulitis is seen. The appendix is within normal limits. Small bowel and stomach are unremarkable. Vascular/Lymphatic: Aortic atherosclerosis. No enlarged abdominal or pelvic lymph nodes. Reproductive: Prostate is within normal limits. Other: Minimal edematous changes are noted in the perineum eccentric to the right best seen on the coronal images number 77 through 65 of series 5. No focal abscess is seen. No subcutaneous air is noted. No scrotal edema is seen. Musculoskeletal: No acute or significant osseous findings. IMPRESSION: Minimal edematous changes in the perineum extrinsic to the right consistent with the given clinical history. No subcutaneous air or abscess is noted to suggest Fournier's gangrene. No definitive skin breakdown is noted. Stable adrenal lesions bilaterally dating back to 2019. Consistent with adenomas and no further follow-up is recommended. Diverticulosis without diverticulitis. Electronically Signed   By: Alcide Clever M.D.   On: 04/02/2023 20:09   XR C-ARM NO REPORT  Result Date: 03/30/2023 Please see Notes tab for imaging impression.  CT ABDOMEN PELVIS WO CONTRAST  Result Date: 03/17/2023 CLINICAL DATA:  57 year old male with acute abdominal and back pain. EXAM: CT ABDOMEN AND PELVIS WITHOUT CONTRAST TECHNIQUE: Multidetector CT imaging of the abdomen and pelvis was performed following the standard protocol without IV contrast. RADIATION  DOSE REDUCTION: This exam was performed according to the departmental dose-optimization program which includes automated exposure control, adjustment of the mA and/or kV according to patient size and/or use of iterative reconstruction technique. COMPARISON:  CT Abdomen and Pelvis 09/18/2017.  Chest CT 02/27/2023. FINDINGS: Lower chest: Mild cardiomegaly appears stable since 2019. Cardiac pacemaker, AICD lead(s). No pericardial effusion. Mild lung base atelectasis. No pleural effusion. Hepatobiliary: Chronic cholecystectomy.  Negative noncontrast liver. Pancreas: Negative noncontrast pancreas. Spleen: Negative. Adrenals/Urinary Tract: Round 2.3 cm left adrenal nodule with 8 Hounsfield units internal density. Contralateral smaller 1.7 cm right adrenal nodule with 12 Hounsfield units. These are non incidental, not significantly changed since 2019 and most consistent with benign bilateral adrenal adenoma (no follow-up imaging recommended). Renal size and configuration not significantly changed from 2019. Indistinct corticomedullary differentiation bilaterally but no hydronephrosis or nephrolithiasis. No convincing acute renal inflammation. Decompressed ureters. Numerous pelvic phleboliths. But distal ureters remain decompressed. Mild-to-moderately distended urinary bladder. No urinary calculus or inflammation. Stomach/Bowel: Low-density retained stool in the large bowel but mostly decompressed rectum. Diverticulosis of the descending and proximal sigmoid colon but no active inflammation identified. Similar diverticulosis at both the splenic and hepatic flexures. Some ascending colon diverticulosis also. Normal appendix on series 3, image 53. No convincing large bowel inflammation. No dilated small bowel. Possible small chronic gastric hiatal hernia. But otherwise negative noncontrast stomach and duodenum. No free air or free fluid identified. Vascular/Lymphatic: Aortoiliac calcified atherosclerosis. Normal caliber  abdominal aorta. Vascular patency is not evaluated in the absence of IV contrast. No lymphadenopathy identified. Reproductive: Negative. Other: No pelvis free fluid. Musculoskeletal: Increased degenerative lumbar vacuum disc since 2019. Chronic degenerative endplate spurring. Lower lumbar facet degeneration also, with chronic vacuum facet. No acute osseous abnormality identified. IMPRESSION: No acute or inflammatory process identified in the noncontrast abdomen or pelvis; - Extensive colonic diverticulosis but no active inflammation identified. Normal appendix. - decreased renal corticomedullary differentiation suspicious for chronic renal disease. No urinary calculus or evidence of obstructive uropathy. - degenerative lumbar vacuum disc has progressed since 2019. - cardiomegaly with AICD. - Aortic Atherosclerosis (ICD10-I70.0). Electronically Signed   By: Odessa Fleming M.D.   On: 03/17/2023 05:03   DG Chest 2 View  Result Date: 03/16/2023 CLINICAL DATA:  Shortness of breath. EXAM: CHEST - 2 VIEW COMPARISON:  February 27, 2023 FINDINGS: There is stable dual lead AICD positioning. The cardiac silhouette is mildly enlarged and unchanged in size. Very mild atelectasis is seen within the bilateral lung bases. No pleural effusion or pneumothorax is identified. The visualized skeletal structures are unremarkable. IMPRESSION: Stable cardiomegaly with very mild bibasilar atelectasis. Electronically Signed   By: Aram Candela M.D.   On: 03/16/2023 18:44    Micro Results     Recent Results (from the past 240 hour(s))  Blood culture (routine x 2)     Status: None (Preliminary result)   Collection Time: 04/02/23  6:33 PM   Specimen: BLOOD  Result Value Ref Range Status   Specimen Description   Final    BLOOD BLOOD LEFT FOREARM Performed at [facility], [address], [place], [place] [address]    Special Requests   Final    Blood Culture adequate volume BOTTLES DRAWN AEROBIC AND  ANAEROBIC Performed at [facility], [address], [place], [place] [address]    Culture   Final    NO GROWTH 4 DAYS Performed at [facility], [address] [place], [place] [address]    Report Status PENDING  Incomplete  Blood culture (routine x 2)     Status: None (Preliminary result)   Collection Time: 04/02/23  6:38 PM   Specimen: BLOOD  Result Value Ref Range Status   Specimen Description   Final    BLOOD BLOOD RIGHT FOREARM Performed at [facility], [address] [place], [place] [address]    Special Requests   Final    Blood Culture adequate volume BOTTLES DRAWN AEROBIC AND ANAEROBIC Performed at [facility], [address], [place], [place] [address]    Culture   Final    NO GROWTH 4 DAYS Performed at [facility], [address] [place], [place] [address]    Report Status PENDING  Incomplete    Today   Subjective    [name] today has no headache,no chest abdominal pain,no new weakness tingling or numbness, feels much better wants to go home today.     Objective   Blood pressure 110/79, pulse 80, temperature (!) 97.3 F (36.3 C), temperature source Oral, resp. rate 15, height 6' (1.829 m), weight 136.1 kg, SpO2 92%.   Intake/Output Summary (Last 24 hours) at 04/07/2023 0909 Last data filed at 04/06/2023 2130 Gross per 24 hour  Intake 120 ml  Output --  Net 120 ml    Exam  Awake Alert, No new F.N deficits,    [place].AT,PERRAL Supple Neck,   Symmetrical Chest wall movement, Good air movement bilaterally, CTAB RRR,No Gallops,   +ve B.Sounds, Abd Soft, Non tender,  Groin and scrotal rash much improved, dry and no surrounding cellulitis   Data Review   Recent Labs  Lab 04/02/23 1552 04/03/23 1046 04/04/23 0639 04/05/23 0315  WBC 9.2 6.3 7.3 7.8  HGB 14.0 13.6 14.8 13.6  HCT 42.4 41.5 45.7 41.6  PLT 262 235 211 271  MCV 94.6 95.4 97.6 94.1  MCH 31.3 31.3 31.6  30.8  MCHC 33.0 32.8 32.4 32.7  RDW 14.5 14.5 14.5 14.2  LYMPHSABS  --  1.9 2.4 2.5  MONOABS  --  0.6 0.6 0.7  EOSABS  --  0.1 0.1 0.2  BASOSABS  --  0.0 0.1 0.1    Recent Labs  Lab 04/02/23 1552 04/02/23 1903 04/03/23 0854 04/03/23 1046 04/04/23 0639 04/04/23 1031 04/05/23 0315  NA 134*  --   --  141 139  --  140  K 4.0  --   --  4.0 4.5  --  4.3  CL 97*  --   --  105 100  --  105  CO2 26  --   --  27 26  --  26  ANIONGAP 11  --   --  9 13  --  9  GLUCOSE 455*  --   --  148* 199*  --  146*  BUN 44*  --   --  41* 42*  --  43*  CREATININE 3.14*  --   --  2.65* 2.83*  --  2.52*  AST  --   --   --  18  --   --   --   ALT  --   --   --  24  --   --   --   ALKPHOS  --   --   --  64  --   --   --   BILITOT  --   --   --  0.5  --   --   --   ALBUMIN  --   --   --  4.1  --   --   --   CRP  --   --   --   --  1.6*  --  1.2*  PROCALCITON  --   --   --   --  0.11  --  <0.10  LATICACIDVEN  --  1.8  --   --   --   --   --   INR  --   --   --   --  1.1  --   --   HGBA1C  --   --  9.3*  --   --   --   --   BNP  --   --   --   --   --  340.6* 57.4  MG  --   --   --   --  2.8*  --  2.5*  CALCIUM 9.9  --   --  9.2 9.2  --  9.6    Total Time in preparing paper work, data evaluation and todays exam - 35 minutes  Signature  -    Susa Raring M.D on 04/07/2023 at 9:09 AM   -  To page go to [email or web address]

## 2023-04-08 ENCOUNTER — Telehealth: Payer: Self-pay

## 2023-04-08 LAB — CULTURE, BLOOD (ROUTINE X 2)
Culture: NO GROWTH
Culture: NO GROWTH
Special Requests: ADEQUATE
Special Requests: ADEQUATE

## 2023-04-08 NOTE — Transitions of Care (Post Inpatient/ED Visit) (Signed)
04/08/2023  Name: [name] MRN: [id number] DOB: 24-Sep-1965  Today's TOC FU Call Status: Today's TOC FU Call Status:: Successful TOC FU Call Completed TOC FU Call Complete Date: 04/08/23 Patient's Name and Date of Birth confirmed.  Transition Care Management Follow-up Telephone Call Date of Discharge: 04/07/23 Discharge Facility: [facility]) Type of Discharge: Inpatient Admission Primary Inpatient Discharge Diagnosis:: cellulitis How have you been since you were released from the hospital?: Better Any questions or concerns?: No  Items Reviewed: Did you receive and understand the discharge instructions provided?: Yes Medications obtained,verified, and reconciled?: Partial Review Completed Reason for Partial Mediation Review: He said he has all medications and did not have any questions about the med regime and did not need to review the medication list. He stated that he has a Freestyle meter Any new allergies since your discharge?: No Dietary orders reviewed?: Yes Type of Diet Ordered:: heart healthy, low carb and a 1.5 L /day fluid restriction.  he said he is trying to adhere to the recommendations Do you have support at home?: Yes Name of Support/Comfort Primary Source: he did not clarify who he has for suport, he just noted that he has help at home.  Medications Reviewed Today: Medications Reviewed Today   Medications were not reviewed in this encounter     Home Care and Equipment/Supplies: Were Home Health Services Ordered?: No Any new equipment or medical supplies ordered?: No  Functional Questionnaire: Do you need assistance with bathing/showering or dressing?: No (He said he is trying to keep scrotal area clean and dry. He stated that he has a scale and weighs himself daily and keeps a log of his weights) Do you need assistance with meal preparation?: No Do you need assistance with eating?: No Do you have difficulty maintaining continence: No Do you need  assistance with getting out of bed/getting out of a chair/moving?: No Do you have difficulty managing or taking your medications?: No  Follow up appointments reviewed: PCP Follow-up appointment confirmed?: No (He said he will call to schedule an appointment, he did not want me to schedule one for him.) Specialist Hospital Follow-up appointment confirmed?: Yes Date of Specialist follow-up appointment?: 04/14/23 Follow-Up Specialty Provider:: endocrinology; 05/05/2023: podiatry; 05/06/2023: cardiology- physician defib check Do you need transportation to your follow-up appointment?: No Do you understand care options if your condition(s) worsen?: Yes-patient verbalized understanding    SIGNATURE Robyne Peers, RN

## 2023-04-14 ENCOUNTER — Encounter: Payer: Self-pay | Admitting: Internal Medicine

## 2023-04-14 ENCOUNTER — Ambulatory Visit (INDEPENDENT_AMBULATORY_CARE_PROVIDER_SITE_OTHER): Payer: Medicaid Other | Admitting: Internal Medicine

## 2023-04-14 VITALS — BP 128/80 | HR 67 | Ht 72.0 in | Wt 293.2 lb

## 2023-04-14 DIAGNOSIS — Z7984 Long term (current) use of oral hypoglycemic drugs: Secondary | ICD-10-CM

## 2023-04-14 DIAGNOSIS — E1159 Type 2 diabetes mellitus with other circulatory complications: Secondary | ICD-10-CM

## 2023-04-14 DIAGNOSIS — E785 Hyperlipidemia, unspecified: Secondary | ICD-10-CM

## 2023-04-14 DIAGNOSIS — Z794 Long term (current) use of insulin: Secondary | ICD-10-CM

## 2023-04-14 DIAGNOSIS — E1169 Type 2 diabetes mellitus with other specified complication: Secondary | ICD-10-CM | POA: Diagnosis not present

## 2023-04-14 DIAGNOSIS — E1165 Type 2 diabetes mellitus with hyperglycemia: Secondary | ICD-10-CM

## 2023-04-14 NOTE — Patient Instructions (Addendum)
Please continue: - Farxiga 10 mg before breakfast - Lantus 42 units at bedtime - Novolog 24-26 units 2x a day, before meals  NO JUICE!  Please return for another visit in 3-4 months.

## 2023-04-14 NOTE — Progress Notes (Signed)
Patient ID: [name], male   DOB: 03-Oct-1965, 57 y.o.   MRN: [id number]  HPI: [name] is a 57 y.o.-year-old male, initially referred by his PCP, Dr. Laural Benes, returning for follow-up for DM2, dx in 2010-15, insulin-dependent since 2023, uncontrolled, with complications (nonischemic cardiomyopathy, CHF with reduced ejection fraction, history of ventricular tachycardia, history of cardiac arrest, s/p ICD placement, stage IIIb CKD).  Last visit 4 months ago.  Interim history: No increased urination, blurry vision, nausea, chest pain. Stopped sweet tea before last visit. Now drinking cranberry juice. He had an episode of cellulitis >> admitted 10 days ago. This is resolved.  Now  Reviewed HbA1c: Lab Results  Component Value Date   HGBA1C 9.3 (H) 04/03/2023   HGBA1C 9.1 (A) 02/13/2023   HGBA1C 8.9 12/12/2022   HGBA1C 7.9 (A) 09/18/2022   HGBA1C 10.2 (H) 05/04/2022   HGBA1C 15.0 (A) 01/30/2022   HGBA1C 13.0 (H) 08/26/2021   HGBA1C 8.9 (A) 03/19/2021   HGBA1C 8.2 (H) 07/23/2020   HGBA1C 8.7 (A) 04/09/2020   Pt is on a regimen of: - Farxiga 10 mg before breakfast - Lantus 22 >> 26 >> 30 >> 36 >> 42 units at bedtime - Novolog 12 >> 8-12 >> 28 (steroids) >> 16 units >> 14-18 >> 24-26 2x a day, before meals He reported diarrhea after increased Trulicity >> hypokalemia >> cardiac arrest - was in ICU for 16 days, had heart surgery >> now PTSD.  Pt checks his sugars >4x a day  - with the CGM receiver:  Prev.:  Previously:  Lowest sugar was 69 >> 69 >> 69; he has hypoglycemia awareness at 70.  Highest sugar was 300 (forgot insulin) >> 340 (steroids) >> 380  Glucometer: Accu-Chek guide  Pt's meals are: - Breakfast: muffins, 3 eggs - Lunch: skips - Dinner: salad from [employer] - tuna, onions, olives, crackers + cookie - Snacks: cranberry juice Previously also drinking regular sodas and sweet tea, which she stopped.  - + CKD - sees nephrology ([facility]), last BUN/creatinine:  Lab  Results  Component Value Date   BUN 43 (H) 04/05/2023   BUN 42 (H) 04/04/2023   CREATININE 2.52 (H) 04/05/2023   CREATININE 2.83 (H) 04/04/2023  On Farxiga 10 mg daily, Entresto.  -+ HL; last set of lipids: Lab Results  Component Value Date   CHOL 166 02/13/2023   HDL 44 02/13/2023   LDLCALC 76 02/13/2023   TRIG 283 (H) 02/13/2023   CHOLHDL 3.8 02/13/2023  On Lipitor 10 mg daily.  - last eye exam was on 09/23/2022. No DR. Sees Dr. Dione Booze.  - no numbness and tingling in his feet. On Neurontin 300 mg at bedtime.  Last foot exam 09/2022.  Pt has FH of DM in mother and father.  He also has a history of HTN, kidney stones, GERD, IBS, gout, chronic low back pain (spondylosis and spinal stenosis), Since 04/2022, after his cardiac arrest, he has been SOB and has gained 30 lbs.  He also has anxiety and PTSD.   He is on amiodarone, ASA 81.  ROS: + See HPI  Past Medical History:  Diagnosis Date   AICD (automatic cardioverter/defibrillator) present    Chronic bronchitis (HCC)    "get it most q yr" (12/23/2013)   Chronic systolic (congestive) heart failure (HCC)    Fracture of left humerus    a. 07/2013.   GERD (gastroesophageal reflux disease)    not at present time   Heart murmur    "  born w/it"    History of renal calculi    Hypertension    Kidney stones    NICM (nonischemic cardiomyopathy) (HCC)    a. 07/2013 Echo: EF 20-25%, diff HK, Gr2 DD, mild MR, mod dil LA/RA. 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    Shockable heart rhythm detected by automated external defibrillator    Syncope    Type II diabetes mellitus (HCC) 2006   Past Surgical History:  Procedure Laterality Date   CARDIAC CATHETERIZATION  08/10/13   CHOLECYSTECTOMY  ~ 2010   IMPLANTABLE CARDIOVERTER DEFIBRILLATOR IMPLANT  12/23/2013   STJ Fortify ICD implanted by Dr Johney Frame for cardiomyopathy and syncope   IMPLANTABLE CARDIOVERTER  DEFIBRILLATOR IMPLANT N/A 12/23/2013   Procedure: IMPLANTABLE CARDIOVERTER DEFIBRILLATOR IMPLANT;  Surgeon: Gardiner Rhyme, MD;  Location: [facility] CATH LAB;  Service: Cardiovascular;  Laterality: N/A;   INGUINAL HERNIA REPAIR Left 03/01/2018   Procedure: OPEN REPAIR OF LEFT INGUINAL HERNIA WITH MESH;  Surgeon: Gaynelle Adu, MD;  Location: [facility] ORS;  Service: General;  Laterality: Left;   LEAD REVISION/REPAIR N/A 05/15/2022   Procedure: LEAD REVISION/REPAIR;  Surgeon: Lanier Prude, MD;  Location: [facility] INVASIVE CV LAB;  Service: Cardiovascular;  Laterality: N/A;   LEFT HEART CATHETERIZATION WITH CORONARY ANGIOGRAM N/A 08/10/2013   Procedure: LEFT HEART CATHETERIZATION WITH CORONARY ANGIOGRAM;  Surgeon: Corky Crafts, MD;  Location: [facility] CATH LAB;  Service: Cardiovascular;  Laterality: N/A;   ORIF HUMERUS FRACTURE Left 08/12/2013   Procedure: OPEN REDUCTION INTERNAL FIXATION (ORIF) HUMERAL SHAFT FRACTURE;  Surgeon: Nadara Mustard, MD;  Location: [facility] OR;  Service: Orthopedics;  Laterality: Left;  Open Reduction Internal Fixation Left Humerus   RIGHT/LEFT HEART CATH AND CORONARY ANGIOGRAPHY N/A 05/12/2022   Procedure: RIGHT/LEFT HEART CATH AND CORONARY ANGIOGRAPHY;  Surgeon: Dorthula Nettles, DO;  Location: [facility] INVASIVE CV LAB;  Service: Cardiovascular;  Laterality: N/A;   URETEROSCOPY     "laser for kidney stones"   Social History   Socioeconomic History   Marital status: Single    Spouse name: Not on file   Number of children: Not on file   Years of education: Not on file   Highest education level: Not on file  Occupational History   Occupation: Disabled  Tobacco Use   Smoking status: Former    Current packs/day: 0.00    Average packs/day: 1 pack/day for 28.0 years (28.0 ttl pk-yrs)    Types: Cigarettes    Start date: 06/03/1986    Quit date: 06/03/2014    Years since quitting: 8.8   Smokeless tobacco: Never  Vaping Use   Vaping status: Never Used  Substance and Sexual Activity   Alcohol  use: Yes   Drug use: Yes    Types: Marijuana    Comment: occasionally   Sexual activity: Yes  Other Topics Concern   Not on file  Social History Narrative   Pt lives with his brother    Social Determinants of Health   Financial Resource Strain: High Risk (03/24/2023)   Overall Financial Resource Strain (CARDIA)    Difficulty of Paying Living Expenses: Hard  Food Insecurity: No Food Insecurity (03/24/2023)   Hunger Vital Sign    Worried About Running Out of Food in the Last Year: Never true    Ran Out of Food in the Last Year: Never true  Transportation Needs: No Transportation Needs (03/24/2023)   PRAPARE - Transportation  Lack of Transportation (Medical): No    Lack of Transportation (Non-Medical): No  Physical Activity: Not on file  Stress: Not on file  Social Connections: Moderately Integrated (03/12/2021)   Social Connection and Isolation Panel [NHANES]    Frequency of Communication with Friends and Family: More than three times a week    Frequency of Social Gatherings with Friends and Family: More than three times a week    Attends Religious Services: More than 4 times per year    Active Member of [employer] or Organizations: Yes    Attends Banker Meetings: Never    Marital Status: Never married  Intimate Partner Violence: Not At Risk (03/24/2023)   Humiliation, Afraid, Rape, and Kick questionnaire    Fear of Current or Ex-Partner: No    Emotionally Abused: No    Physically Abused: No    Sexually Abused: No   Current Outpatient Medications on File Prior to Visit  Medication Sig Dispense Refill   Accu-Chek Softclix Lancets lancets Use as instructed 100 each 12   acetaminophen (TYLENOL) 500 MG tablet Take 1,000 mg by mouth every 8 (eight) hours as needed.     albuterol (VENTOLIN HFA) 108 (90 Base) MCG/ACT inhaler Inhale 2 puffs into the lungs every 6 (six) hours as needed for wheezing or shortness of breath. 18 g 2   amiodarone (PACERONE) 200 MG tablet Take 1  tablet (200 mg total) by mouth daily. 90 tablet 3   aspirin 81 MG tablet Take 81 mg by mouth daily.     atorvastatin (LIPITOR) 10 MG tablet Take 1 tablet (10 mg total) by mouth daily. 90 tablet 1   Blood Glucose Monitoring Suppl (ACCU-CHEK GUIDE) w/Device KIT UAD 1 kit 0   Blood Glucose Monitoring Suppl DEVI 1 each by Does not apply route in the morning, at noon, and at bedtime. May substitute to any manufacturer covered by patient's insurance. 1 each 0   cetirizine (EQ ALLERGY RELIEF, CETIRIZINE,) 10 MG tablet Take 1 tablet (10 mg total) by mouth daily. 90 tablet 1   cholecalciferol (VITAMIN D3) 25 MCG (1000 UNIT) tablet Take 1,000 Units by mouth daily.     clonazePAM (KLONOPIN) 0.5 MG tablet Take 0.5 tablets (0.25 mg total) by mouth daily AND 1 tablet (0.5 mg total) at bedtime. (Patient taking differently: Take 0.5 tablets (0.25 mg total) by mouth in the morning and 1 tablet (0.5 mg total) at bedtime for a total daily dose of 0.75mg . ) 45 tablet 1   Continuous Glucose Receiver (FREESTYLE LIBRE 2 READER) DEVI USE AS DIRECTED 1 each 0   Continuous Glucose Sensor (FREESTYLE LIBRE 2 SENSOR) MISC Use to check blood sugar TID. Change sensor once every 14 days. E11.65 2 each 11   dapagliflozin propanediol (FARXIGA) 10 MG TABS tablet Take 1 tablet (10 mg total) by mouth daily before breakfast. 90 tablet 3   doxycycline (VIBRA-TABS) 100 MG tablet Take 1 tablet (100 mg total) by mouth every 12 (twelve) hours. 10 tablet 0   EPINEPHrine 0.3 mg/0.3 mL IJ SOAJ injection Inject 0.3 mg into the muscle as needed. 1 each 1   famotidine (PEPCID) 20 MG tablet TAKE 1 TABLET BY MOUTH 1 TO 2 TIMES DAILY AS NEEDED (Patient taking differently: Take 20 mg by mouth 2 (two) times daily as needed for heartburn. TAKE 1 TABLET BY MOUTH 1 TO 2 TIMES DAILY AS NEEDED) 180 tablet 0   fluticasone (FLONASE) 50 MCG/ACT nasal spray Use 1-2 sprays in each nostril once  a day as needed for nasal congestion. 16 g 5    Fluticasone-Umeclidin-Vilant (TRELEGY ELLIPTA IN) Inhale 1 puff into the lungs daily.     gabapentin (NEURONTIN) 300 MG capsule Take 1 capsule by mouth at bedtime 90 capsule 1   glucose blood (ACCU-CHEK GUIDE) test strip Use as instructed 100 each 12   glucose blood (FREESTYLE LITE) test strip Use as instructed.  Please make sure this is compatible with [facility] 2 reader. 100 each 12   insulin aspart (NOVOLOG) 100 UNIT/ML injection Inject 24 Units into the skin 3 (three) times daily with meals.     insulin glargine (LANTUS SOLOSTAR) 100 UNIT/ML Solostar Pen Inject 42 Units into the skin daily.     Insulin Pen Needle (B-D ULTRAFINE III SHORT PEN) 31G X 8 MM MISC USE AS DIRECTED 100 each 6   Lancet Devices (ACCU-CHEK SOFTCLIX) lancets 1 each by Other route 3 (three) times daily. 1 each 0   loperamide (IMODIUM) 2 MG capsule Take 1 capsule (2 mg total) by mouth as needed for diarrhea or loose stools. 12 capsule 0   methocarbamol (ROBAXIN) 500 MG tablet Take 500 mg by mouth daily.     metoprolol succinate (TOPROL-XL) 50 MG 24 hr tablet Take 1 tablet (50 mg total) by mouth daily. 90 tablet 3   nystatin (MYCOSTATIN/NYSTOP) powder Apply topically 3 (three) times daily. 15 g 0   sacubitril-valsartan (ENTRESTO) 24-26 MG Take 1 tablet by mouth 2 (two) times daily. 60 tablet 5   sertraline (ZOLOFT) 50 MG tablet Take 1 tablet (50 mg total) by mouth daily. 90 tablet 0   spironolactone (ALDACTONE) 25 MG tablet Take 0.5 tablets (12.5 mg total) by mouth daily. 30 tablet 5   tamsulosin (FLOMAX) 0.4 MG CAPS capsule Take 2 capsules (0.8 mg total) by mouth daily. 180 capsule 1   torsemide (DEMADEX) 20 MG tablet Take 4 tablets (80 mg total) by mouth daily. 120 tablet 3   No current facility-administered medications on file prior to visit.   Allergies  Allergen Reactions   Bee Venom Anaphylaxis   Trulicity [Dulaglutide] Anaphylaxis and Diarrhea    Extra dose of Trulicity caused cardiac arrest. Patient experience  onset of diarrhea with using this medication - cleared after med stopped.   Family History  Problem Relation Age of Onset   Diabetes Father    Arthritis Father    Hypertension Father    Diabetes Mother    Arthritis Mother    Hypertension Mother    Hypertension Brother    PE: BP 128/80   Pulse 67   SpO2 94%  Wt Readings from Last 3 Encounters:  04/03/23 300 lb (136.1 kg)  03/16/23 292 lb (132.5 kg)  03/03/23 295 lb 12.8 oz (134.2 kg)   Constitutional: overweight, in NAD Eyes:  EOMI, no exophthalmos ENT: no neck masses, no cervical lymphadenopathy Cardiovascular: RRR, No MRG Respiratory: CTA B Musculoskeletal: no deformities Skin:no rashes Neurological: no tremor with outstretched hands  ASSESSMENT: 1. DM2, insulin-dependent, uncontrolled, with long-term complications: - NICM - CHF with reduced ejection fraction-20 to 25% 08/2022 - history of VT + cardiac arrest 04/2022 (after developing diarrhea from increasing Trulicity dose), s/p ICD placement - stage IIIb CKD  PLAN:  1. Patient with longstanding, uncontrolled, type 2 diabetes, on oral antidiabetic regimen with SGLT2 inhibitor along with basal large bolus insulin, adjusted at last visit.  At that time, sugars were fluctuating within a Mounjaro range around the upper limit of the target interval, 180.  We increased both Lantus and NovoLog doses.  I  recommended a referral to nutrition but he wanted to hold off.  He was telling me that his cardiology has also recommended nutritional advised and he was trying to get this scheduled through cardiology office.  I strongly advised him to stop sweet tea or any sweet drinks and not to eat in the middle of the night and also to vary the dose of NovoLog based on the size of his meals.  HbA1c at that time was 8.9%, higher, but since then, he had another HbA1c obtained at the end of last month and this was even higher, at 9.3%. -Of note, he was intolerant to GLP-1 receptor agonist in the  past (see HPI) CGM interpretation: -At today's visit, we reviewed his CGM downloads: It appears that 56% of values are in target range (goal >70%), while 44% are higher than 180 (goal <25%), and 0% are lower than 70 (goal <4%).  The calculated average blood sugar is 183.  The projected HbA1c for the next 3 months (GMI) is 7.7%. -Reviewing the CGM trends, sugars appear to be more fluctuating than before, around the upper limit of the target range but with significant hyperglycemic spikes especially after lunch, and lower spikes after dinner.  Upon questioning, he started to drink sweet drinks again, after initially stopping sweet tea since last visit.  He currently drinks cranberry juice.  I advised him that he cannot drink any juice, regular soda, or sweet tea since this greatly increase his blood sugars.  We also discussed again about not eating in the middle of the night.  He is having dinner late, between 8 and 9 PM and sugars are getting higher afterwards, but for now, I would like to see how his sugars behave after stopping sweet drinks before changing his regimen. - I suggested to:  Patient Instructions  Please continue: - Farxiga 10 mg before breakfast - Lantus 42 units at bedtime - Novolog 24-26 units 2x a day, before meals  NO JUICE!  Please return for another visit in 3-4 months.   - advised to check sugars at different times of the day - 4x a day, rotating check times - advised for yearly eye exams >> he is UTD - return to clinic in 3-4 months  2. HL -Latest lipid panel was reviewed from 02/2023: LDL above target of less than 55, triglycerides also high: Lab Results  Component Value Date   CHOL 166 02/13/2023   HDL 44 02/13/2023   LDLCALC 76 02/13/2023   TRIG 283 (H) 02/13/2023   CHOLHDL 3.8 02/13/2023  -He continues on Lipitor 10 mg daily without side effects   Carlus Pavlov, MD PhD [facility] Endocrinology

## 2023-04-15 ENCOUNTER — Telehealth: Payer: Self-pay | Admitting: Internal Medicine

## 2023-04-15 NOTE — Telephone Encounter (Signed)
Patient needs refill of Trelegy.  [facility] on [address]

## 2023-04-16 MED ORDER — TRELEGY ELLIPTA 100-62.5-25 MCG/ACT IN AEPB: 1.0000 | INHALATION_SPRAY | Freq: Every day | RESPIRATORY_TRACT | 1 refills | Status: DC

## 2023-04-16 NOTE — Telephone Encounter (Signed)
Trelegy 100 sent to preferred pharmacy.  ?Patient is aware and voiced her understanding.  ?Nothing further needed.  ? ?

## 2023-04-20 ENCOUNTER — Other Ambulatory Visit: Payer: Self-pay | Admitting: Family Medicine

## 2023-04-20 DIAGNOSIS — F411 Generalized anxiety disorder: Secondary | ICD-10-CM | POA: Diagnosis not present

## 2023-04-20 DIAGNOSIS — F4312 Post-traumatic stress disorder, chronic: Secondary | ICD-10-CM | POA: Diagnosis not present

## 2023-04-22 ENCOUNTER — Telehealth: Payer: Self-pay

## 2023-04-22 ENCOUNTER — Other Ambulatory Visit (HOSPITAL_COMMUNITY): Payer: Self-pay

## 2023-04-22 NOTE — Telephone Encounter (Signed)
Pharmacy Patient Advocate Encounter   Received notification from CoverMyMeds that prior authorization for Trelegy Ellipta 100-62.5-25MCG/ACT aerosol powder is required/requested.   Insurance verification completed.   The patient is insured through [facility] .   Per test claim: PA required; PA submitted to above mentioned insurance via CoverMyMeds Key/confirmation #/[id number] Status is pending

## 2023-04-24 ENCOUNTER — Encounter: Payer: Self-pay | Admitting: Internal Medicine

## 2023-04-24 ENCOUNTER — Ambulatory Visit: Payer: Medicaid Other | Attending: Internal Medicine | Admitting: Internal Medicine

## 2023-04-24 ENCOUNTER — Emergency Department (HOSPITAL_COMMUNITY): Payer: Medicaid Other

## 2023-04-24 ENCOUNTER — Inpatient Hospital Stay (HOSPITAL_COMMUNITY)
Admission: EM | Admit: 2023-04-24 | Discharge: 2023-04-26 | DRG: 300 | Disposition: A | Discharge status: Home / Self Care | Payer: Medicaid Other | Attending: Internal Medicine | Admitting: Internal Medicine

## 2023-04-24 ENCOUNTER — Encounter (HOSPITAL_COMMUNITY): Payer: Self-pay | Admitting: *Deleted

## 2023-04-24 ENCOUNTER — Ambulatory Visit: Payer: Self-pay | Admitting: *Deleted

## 2023-04-24 ENCOUNTER — Other Ambulatory Visit: Payer: Self-pay

## 2023-04-24 VITALS — BP 113/73 | HR 63 | Temp 98.5°F | Ht 72.0 in | Wt 294.0 lb

## 2023-04-24 DIAGNOSIS — N138 Other obstructive and reflux uropathy: Secondary | ICD-10-CM | POA: Diagnosis present

## 2023-04-24 DIAGNOSIS — J9811 Atelectasis: Secondary | ICD-10-CM | POA: Diagnosis not present

## 2023-04-24 DIAGNOSIS — Z9049 Acquired absence of other specified parts of digestive tract: Secondary | ICD-10-CM

## 2023-04-24 DIAGNOSIS — Z6839 Body mass index (BMI) 39.0-39.9, adult: Secondary | ICD-10-CM

## 2023-04-24 DIAGNOSIS — I493 Ventricular premature depolarization: Secondary | ICD-10-CM | POA: Diagnosis present

## 2023-04-24 DIAGNOSIS — I428 Other cardiomyopathies: Secondary | ICD-10-CM | POA: Diagnosis not present

## 2023-04-24 DIAGNOSIS — R079 Chest pain, unspecified: Secondary | ICD-10-CM | POA: Diagnosis not present

## 2023-04-24 DIAGNOSIS — R0789 Other chest pain: Secondary | ICD-10-CM | POA: Diagnosis present

## 2023-04-24 DIAGNOSIS — N401 Enlarged prostate with lower urinary tract symptoms: Secondary | ICD-10-CM | POA: Diagnosis not present

## 2023-04-24 DIAGNOSIS — Z888 Allergy status to other drugs, medicaments and biological substances status: Secondary | ICD-10-CM

## 2023-04-24 DIAGNOSIS — E66813 Obesity, class 3: Secondary | ICD-10-CM | POA: Diagnosis present

## 2023-04-24 DIAGNOSIS — F411 Generalized anxiety disorder: Secondary | ICD-10-CM | POA: Diagnosis not present

## 2023-04-24 DIAGNOSIS — I82B12 Acute embolism and thrombosis of left subclavian vein: Secondary | ICD-10-CM | POA: Diagnosis not present

## 2023-04-24 DIAGNOSIS — I82622 Acute embolism and thrombosis of deep veins of left upper extremity: Secondary | ICD-10-CM | POA: Diagnosis not present

## 2023-04-24 DIAGNOSIS — Z87891 Personal history of nicotine dependence: Secondary | ICD-10-CM | POA: Diagnosis not present

## 2023-04-24 DIAGNOSIS — I10 Essential (primary) hypertension: Secondary | ICD-10-CM | POA: Diagnosis not present

## 2023-04-24 DIAGNOSIS — I130 Hypertensive heart and chronic kidney disease with heart failure and stage 1 through stage 4 chronic kidney disease, or unspecified chronic kidney disease: Secondary | ICD-10-CM | POA: Diagnosis not present

## 2023-04-24 DIAGNOSIS — E1142 Type 2 diabetes mellitus with diabetic polyneuropathy: Secondary | ICD-10-CM | POA: Diagnosis present

## 2023-04-24 DIAGNOSIS — M7989 Other specified soft tissue disorders: Secondary | ICD-10-CM | POA: Diagnosis not present

## 2023-04-24 DIAGNOSIS — I5042 Chronic combined systolic (congestive) and diastolic (congestive) heart failure: Secondary | ICD-10-CM | POA: Diagnosis not present

## 2023-04-24 DIAGNOSIS — I517 Cardiomegaly: Secondary | ICD-10-CM | POA: Diagnosis not present

## 2023-04-24 DIAGNOSIS — I82C12 Acute embolism and thrombosis of left internal jugular vein: Secondary | ICD-10-CM | POA: Diagnosis present

## 2023-04-24 DIAGNOSIS — E1169 Type 2 diabetes mellitus with other specified complication: Secondary | ICD-10-CM | POA: Diagnosis not present

## 2023-04-24 DIAGNOSIS — Z8261 Family history of arthritis: Secondary | ICD-10-CM

## 2023-04-24 DIAGNOSIS — E1122 Type 2 diabetes mellitus with diabetic chronic kidney disease: Secondary | ICD-10-CM | POA: Diagnosis not present

## 2023-04-24 DIAGNOSIS — M79602 Pain in left arm: Secondary | ICD-10-CM | POA: Diagnosis not present

## 2023-04-24 DIAGNOSIS — Z91030 Bee allergy status: Secondary | ICD-10-CM

## 2023-04-24 DIAGNOSIS — Z95810 Presence of automatic (implantable) cardiac defibrillator: Secondary | ICD-10-CM

## 2023-04-24 DIAGNOSIS — I472 Ventricular tachycardia, unspecified: Secondary | ICD-10-CM | POA: Diagnosis not present

## 2023-04-24 DIAGNOSIS — Z79899 Other long term (current) drug therapy: Secondary | ICD-10-CM

## 2023-04-24 DIAGNOSIS — Z7901 Long term (current) use of anticoagulants: Secondary | ICD-10-CM

## 2023-04-24 DIAGNOSIS — Z794 Long term (current) use of insulin: Secondary | ICD-10-CM | POA: Diagnosis not present

## 2023-04-24 DIAGNOSIS — I82A12 Acute embolism and thrombosis of left axillary vein: Secondary | ICD-10-CM | POA: Diagnosis not present

## 2023-04-24 DIAGNOSIS — J4489 Other specified chronic obstructive pulmonary disease: Secondary | ICD-10-CM | POA: Diagnosis present

## 2023-04-24 DIAGNOSIS — E785 Hyperlipidemia, unspecified: Secondary | ICD-10-CM | POA: Diagnosis present

## 2023-04-24 DIAGNOSIS — I2510 Atherosclerotic heart disease of native coronary artery without angina pectoris: Secondary | ICD-10-CM | POA: Diagnosis not present

## 2023-04-24 DIAGNOSIS — Z87442 Personal history of urinary calculi: Secondary | ICD-10-CM

## 2023-04-24 DIAGNOSIS — Z7982 Long term (current) use of aspirin: Secondary | ICD-10-CM

## 2023-04-24 DIAGNOSIS — Z833 Family history of diabetes mellitus: Secondary | ICD-10-CM

## 2023-04-24 DIAGNOSIS — L304 Erythema intertrigo: Secondary | ICD-10-CM | POA: Diagnosis present

## 2023-04-24 DIAGNOSIS — Z8674 Personal history of sudden cardiac arrest: Secondary | ICD-10-CM

## 2023-04-24 DIAGNOSIS — Z8249 Family history of ischemic heart disease and other diseases of the circulatory system: Secondary | ICD-10-CM

## 2023-04-24 DIAGNOSIS — Z86718 Personal history of other venous thrombosis and embolism: Secondary | ICD-10-CM

## 2023-04-24 DIAGNOSIS — I5022 Chronic systolic (congestive) heart failure: Secondary | ICD-10-CM | POA: Diagnosis not present

## 2023-04-24 DIAGNOSIS — N184 Chronic kidney disease, stage 4 (severe): Secondary | ICD-10-CM | POA: Diagnosis not present

## 2023-04-24 DIAGNOSIS — K219 Gastro-esophageal reflux disease without esophagitis: Secondary | ICD-10-CM | POA: Diagnosis present

## 2023-04-24 DIAGNOSIS — Z5986 Financial insecurity: Secondary | ICD-10-CM

## 2023-04-24 LAB — BASIC METABOLIC PANEL
Anion gap: 13 (ref 5–15)
BUN: 27 mg/dL — ABNORMAL HIGH (ref 6–20)
CO2: 23 mmol/L (ref 22–32)
Calcium: 9.1 mg/dL (ref 8.9–10.3)
Chloride: 103 mmol/L (ref 98–111)
Creatinine, Ser: 2.58 mg/dL — ABNORMAL HIGH (ref 0.61–1.24)
GFR, Estimated: 28 mL/min — ABNORMAL LOW (ref >60)
Glucose, Bld: 170 mg/dL — ABNORMAL HIGH (ref 70–99)
Potassium: 4 mmol/L (ref 3.5–5.1)
Sodium: 139 mmol/L (ref 135–145)

## 2023-04-24 LAB — CBC
HCT: 45.1 % (ref 39.0–52.0)
Hemoglobin: 14.9 g/dL (ref 13.0–17.0)
MCH: 31.2 pg (ref 26.0–34.0)
MCHC: 33 g/dL (ref 30.0–36.0)
MCV: 94.5 fL (ref 80.0–100.0)
Platelets: 178 10*3/uL (ref 150–400)
RBC: 4.77 MIL/uL (ref 4.22–5.81)
RDW: 14.1 % (ref 11.5–15.5)
WBC: 6.7 10*3/uL (ref 4.0–10.5)
nRBC: 0 % (ref 0.0–0.2)

## 2023-04-24 LAB — MAGNESIUM: Magnesium: 2.4 mg/dL (ref 1.7–2.4)

## 2023-04-24 LAB — CK: Total CK: 210 U/L (ref 49–397)

## 2023-04-24 LAB — GLUCOSE, CAPILLARY: Glucose-Capillary: 314 mg/dL — ABNORMAL HIGH (ref 70–99)

## 2023-04-24 LAB — TROPONIN I (HIGH SENSITIVITY)
Troponin I (High Sensitivity): 11 ng/L (ref <18)
Troponin I (High Sensitivity): 8 ng/L (ref <18)

## 2023-04-24 MED ORDER — CLONAZEPAM 0.5 MG PO TABS
0.5000 mg | ORAL_TABLET | Freq: Two times a day (BID) | ORAL | Status: DC | PRN
  Administered 2023-04-25: 0.5 mg via ORAL
  Filled 2023-04-24: qty 1

## 2023-04-24 MED ORDER — METHOCARBAMOL 500 MG PO TABS
500.0000 mg | ORAL_TABLET | Freq: Every day | ORAL | Status: DC
  Administered 2023-04-24 – 2023-04-26 (×2): 500 mg via ORAL
  Filled 2023-04-24 (×2): qty 1

## 2023-04-24 MED ORDER — AMIODARONE HCL 200 MG PO TABS
200.0000 mg | ORAL_TABLET | Freq: Every day | ORAL | Status: DC
  Administered 2023-04-25 – 2023-04-26 (×2): 200 mg via ORAL
  Filled 2023-04-24 (×2): qty 1

## 2023-04-24 MED ORDER — INSULIN ASPART 100 UNIT/ML IJ SOLN
0.0000 [IU] | Freq: Three times a day (TID) | INTRAMUSCULAR | Status: DC
  Administered 2023-04-25: 5 [IU] via SUBCUTANEOUS
  Administered 2023-04-25 – 2023-04-26 (×5): 3 [IU] via SUBCUTANEOUS

## 2023-04-24 MED ORDER — NYSTATIN 100000 UNIT/GM EX POWD: Freq: Three times a day (TID) | CUTANEOUS | 0 refills | Status: DC

## 2023-04-24 MED ORDER — SPIRONOLACTONE 12.5 MG HALF TABLET
12.5000 mg | ORAL_TABLET | Freq: Every day | ORAL | Status: DC
  Administered 2023-04-25 – 2023-04-26 (×2): 12.5 mg via ORAL
  Filled 2023-04-24 (×2): qty 1

## 2023-04-24 MED ORDER — HEPARIN (PORCINE) 25000 UT/250ML-% IV SOLN
1700.0000 [IU]/h | INTRAVENOUS | Status: AC
  Administered 2023-04-24: 1800 [IU]/h via INTRAVENOUS
  Administered 2023-04-25: 1700 [IU]/h via INTRAVENOUS
  Administered 2023-04-25: 1800 [IU]/h via INTRAVENOUS
  Administered 2023-04-26: 1700 [IU]/h via INTRAVENOUS
  Filled 2023-04-24 (×4): qty 250

## 2023-04-24 MED ORDER — INSULIN GLARGINE-YFGN 100 UNIT/ML ~~LOC~~ SOLN
42.0000 [IU] | Freq: Every day | SUBCUTANEOUS | Status: DC
  Administered 2023-04-25: 42 [IU] via SUBCUTANEOUS
  Filled 2023-04-24 (×2): qty 0.42

## 2023-04-24 MED ORDER — METOPROLOL SUCCINATE ER 25 MG PO TB24
50.0000 mg | ORAL_TABLET | Freq: Every day | ORAL | Status: DC
  Administered 2023-04-25 – 2023-04-26 (×2): 50 mg via ORAL
  Filled 2023-04-24 (×2): qty 2

## 2023-04-24 MED ORDER — ONDANSETRON HCL 4 MG PO TABS: 4.0000 mg | ORAL_TABLET | Freq: Four times a day (QID) | ORAL | Status: DC | PRN

## 2023-04-24 MED ORDER — SERTRALINE HCL 50 MG PO TABS
50.0000 mg | ORAL_TABLET | Freq: Every day | ORAL | Status: DC
Start: 2023-04-25 — End: 2023-04-27
  Administered 2023-04-25 – 2023-04-26 (×2): 50 mg via ORAL
  Filled 2023-04-24 (×2): qty 1

## 2023-04-24 MED ORDER — GABAPENTIN 300 MG PO CAPS
300.0000 mg | ORAL_CAPSULE | Freq: Every day | ORAL | Status: DC
Start: 2023-04-24 — End: 2023-04-27
  Administered 2023-04-24 – 2023-04-25 (×2): 300 mg via ORAL
  Filled 2023-04-24 (×2): qty 1

## 2023-04-24 MED ORDER — VITAMIN D 25 MCG (1000 UNIT) PO TABS
1000.0000 [IU] | ORAL_TABLET | Freq: Every day | ORAL | Status: DC
  Administered 2023-04-25 – 2023-04-26 (×2): 1000 [IU] via ORAL
  Filled 2023-04-24 (×2): qty 1

## 2023-04-24 MED ORDER — METHOCARBAMOL 500 MG PO TABS: 500.0000 mg | ORAL_TABLET | Freq: Every day | ORAL | 1 refills | Status: DC

## 2023-04-24 MED ORDER — TORSEMIDE 20 MG PO TABS
80.0000 mg | ORAL_TABLET | Freq: Every day | ORAL | Status: DC
Start: 2023-04-25 — End: 2023-04-27
  Administered 2023-04-25 – 2023-04-26 (×2): 80 mg via ORAL
  Filled 2023-04-24 (×2): qty 4

## 2023-04-24 MED ORDER — ONDANSETRON HCL 4 MG/2ML IJ SOLN
4.0000 mg | Freq: Four times a day (QID) | INTRAMUSCULAR | Status: DC | PRN
Start: 2023-04-24 — End: 2023-04-27

## 2023-04-24 MED ORDER — FAMOTIDINE 20 MG PO TABS: 20.0000 mg | ORAL_TABLET | Freq: Two times a day (BID) | ORAL | Status: DC | PRN

## 2023-04-24 MED ORDER — HEPARIN BOLUS VIA INFUSION
6500.0000 [IU] | Freq: Once | INTRAVENOUS | Status: AC
Start: 2023-04-24 — End: 2023-04-24
  Administered 2023-04-24: 6500 [IU] via INTRAVENOUS
  Filled 2023-04-24: qty 6500

## 2023-04-24 MED ORDER — CLOTRIMAZOLE 1 % EX OINT: TOPICAL_OINTMENT | CUTANEOUS | 0 refills | Status: DC

## 2023-04-24 MED ORDER — ACETAMINOPHEN 650 MG RE SUPP: 650.0000 mg | Freq: Four times a day (QID) | RECTAL | Status: DC | PRN

## 2023-04-24 MED ORDER — ASPIRIN 81 MG PO CHEW
81.0000 mg | CHEWABLE_TABLET | Freq: Every day | ORAL | Status: DC
  Administered 2023-04-25 – 2023-04-26 (×2): 81 mg via ORAL
  Filled 2023-04-24 (×2): qty 1

## 2023-04-24 MED ORDER — TAMSULOSIN HCL 0.4 MG PO CAPS
0.8000 mg | ORAL_CAPSULE | Freq: Every day | ORAL | Status: DC
  Administered 2023-04-25 – 2023-04-26 (×2): 0.8 mg via ORAL
  Filled 2023-04-24 (×2): qty 2

## 2023-04-24 MED ORDER — INSULIN ASPART 100 UNIT/ML IJ SOLN
0.0000 [IU] | Freq: Every day | INTRAMUSCULAR | Status: DC
  Administered 2023-04-24: 4 [IU] via SUBCUTANEOUS
  Administered 2023-04-25: 2 [IU] via SUBCUTANEOUS

## 2023-04-24 MED ORDER — SACUBITRIL-VALSARTAN 24-26 MG PO TABS
1.0000 | ORAL_TABLET | Freq: Two times a day (BID) | ORAL | Status: DC
  Administered 2023-04-24 – 2023-04-26 (×4): 1 via ORAL
  Filled 2023-04-24 (×4): qty 1

## 2023-04-24 MED ORDER — ATORVASTATIN CALCIUM 10 MG PO TABS
10.0000 mg | ORAL_TABLET | Freq: Every day | ORAL | Status: DC
  Administered 2023-04-25 – 2023-04-26 (×2): 10 mg via ORAL
  Filled 2023-04-24 (×2): qty 1

## 2023-04-24 MED ORDER — SODIUM CHLORIDE 0.9% FLUSH
3.0000 mL | Freq: Two times a day (BID) | INTRAVENOUS | Status: DC
  Administered 2023-04-26: 3 mL via INTRAVENOUS

## 2023-04-24 MED ORDER — ACETAMINOPHEN 325 MG PO TABS: 650.0000 mg | ORAL_TABLET | Freq: Four times a day (QID) | ORAL | Status: DC | PRN

## 2023-04-24 MED ORDER — DAPAGLIFLOZIN PROPANEDIOL 10 MG PO TABS: 10.0000 mg | ORAL_TABLET | Freq: Every day | ORAL | Status: DC

## 2023-04-24 MED ORDER — CLONAZEPAM 0.5 MG PO TABS
0.5000 mg | ORAL_TABLET | Freq: Two times a day (BID) | ORAL | Status: DC | PRN
  Administered 2023-04-24: 0.5 mg via ORAL
  Filled 2023-04-24: qty 1

## 2023-04-24 MED ORDER — ALBUTEROL SULFATE (2.5 MG/3ML) 0.083% IN NEBU: 3.0000 mL | INHALATION_SOLUTION | Freq: Four times a day (QID) | RESPIRATORY_TRACT | Status: DC | PRN

## 2023-04-24 MED ORDER — HYDROMORPHONE HCL 1 MG/ML IJ SOLN
0.5000 mg | INTRAMUSCULAR | Status: DC | PRN
  Administered 2023-04-24: 1 mg via INTRAVENOUS
  Filled 2023-04-24: qty 1

## 2023-04-24 NOTE — ED Notes (Signed)
CK added on in lab by [place]

## 2023-04-24 NOTE — Progress Notes (Signed)
Left upper extremity venous duplex has been completed.   Results can be found in chart review under CV Proc. And relayed to [employer].   04/24/2023 3:36 PM  Vannia Pola Durenda Age, RVT.

## 2023-04-24 NOTE — ED Notes (Signed)
Magnesium added on by lab

## 2023-04-24 NOTE — ED Notes (Signed)
Vascular at bedside

## 2023-04-24 NOTE — Consult Note (Signed)
Hospital Consult    Reason for Consult:  Left upper extremity dvt Referring Physician:  Dr. Rush Landmark MRN #:  [id number]  History of Present Illness: This is a 57 y.o. male has a defibrillator in place for the past years.  He also has a history of a fracture of his left humerus with plating.  For the past 2 days he has had swelling left upper extremity that was not provoked.  Had DVT in the left lower extremity now initiated on anticoagulation.  He denies any previous history of DVT.  States that the left arm feels heavy and is painful but he is able to move it does not wear a brace of the rings.  He has never had similar symptoms.  Past Medical History:  Diagnosis Date   AICD (automatic cardioverter/defibrillator) present    Chronic bronchitis (HCC)    "get it most q yr" (12/23/2013)   Chronic systolic (congestive) heart failure (HCC)    Fracture of left humerus    a. 07/2013.   GERD (gastroesophageal reflux disease)    not at present time   Heart murmur    "born w/it"    History of renal calculi    Hypertension    Kidney stones    NICM (nonischemic cardiomyopathy) (HCC)    a. 07/2013 Echo: EF 20-25%, diff HK, Gr2 DD, mild MR, mod dil LA/RA. 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    Shockable heart rhythm detected by automated external defibrillator    Syncope    Type II diabetes mellitus (HCC) 2006    Past Surgical History:  Procedure Laterality Date   CARDIAC CATHETERIZATION  08/10/13   CHOLECYSTECTOMY  ~ 2010   IMPLANTABLE CARDIOVERTER DEFIBRILLATOR IMPLANT  12/23/2013   STJ Fortify ICD implanted by Dr Johney Frame for cardiomyopathy and syncope   IMPLANTABLE CARDIOVERTER DEFIBRILLATOR IMPLANT N/A 12/23/2013   Procedure: IMPLANTABLE CARDIOVERTER DEFIBRILLATOR IMPLANT;  Surgeon: Gardiner Rhyme, MD;  Location: [facility] CATH LAB;  Service: Cardiovascular;  Laterality: N/A;   INGUINAL HERNIA REPAIR Left 03/01/2018    Procedure: OPEN REPAIR OF LEFT INGUINAL HERNIA WITH MESH;  Surgeon: Gaynelle Adu, MD;  Location: [facility] ORS;  Service: General;  Laterality: Left;   LEAD REVISION/REPAIR N/A 05/15/2022   Procedure: LEAD REVISION/REPAIR;  Surgeon: Lanier Prude, MD;  Location: [facility] INVASIVE CV LAB;  Service: Cardiovascular;  Laterality: N/A;   LEFT HEART CATHETERIZATION WITH CORONARY ANGIOGRAM N/A 08/10/2013   Procedure: LEFT HEART CATHETERIZATION WITH CORONARY ANGIOGRAM;  Surgeon: Corky Crafts, MD;  Location: [facility] CATH LAB;  Service: Cardiovascular;  Laterality: N/A;   ORIF HUMERUS FRACTURE Left 08/12/2013   Procedure: OPEN REDUCTION INTERNAL FIXATION (ORIF) HUMERAL SHAFT FRACTURE;  Surgeon: Nadara Mustard, MD;  Location: [facility] OR;  Service: Orthopedics;  Laterality: Left;  Open Reduction Internal Fixation Left Humerus   RIGHT/LEFT HEART CATH AND CORONARY ANGIOGRAPHY N/A 05/12/2022   Procedure: RIGHT/LEFT HEART CATH AND CORONARY ANGIOGRAPHY;  Surgeon: Dorthula Nettles, DO;  Location: [facility] INVASIVE CV LAB;  Service: Cardiovascular;  Laterality: N/A;   URETEROSCOPY     "laser for kidney stones"    Allergies  Allergen Reactions   Bee Venom Anaphylaxis   Trulicity [Dulaglutide] Anaphylaxis and Diarrhea    Extra dose of Trulicity caused cardiac arrest. Patient experience onset of diarrhea with using this medication - cleared after med stopped.    Prior to Admission medications   Medication Sig Start Date  End Date Taking? Authorizing Provider  Accu-Chek Softclix Lancets lancets Use as instructed 02/01/22   Lonell Grandchild, MD  acetaminophen (TYLENOL) 500 MG tablet Take 1,000 mg by mouth every 8 (eight) hours as needed.    [provider]  albuterol (VENTOLIN HFA) 108 (90 Base) MCG/ACT inhaler Inhale 2 puffs into the lungs every 6 (six) hours as needed for wheezing or shortness of breath. 10/24/22   Glenford Bayley, NP  amiodarone (PACERONE) 200 MG tablet Take 1 tablet (200 mg total) by mouth daily. 08/15/22    Sheilah Pigeon, PA-C  aspirin 81 MG tablet Take 81 mg by mouth daily.    [provider]  atorvastatin (LIPITOR) 10 MG tablet Take 1 tablet (10 mg total) by mouth daily. 01/27/23   Marcine Matar, MD  Blood Glucose Monitoring Suppl (ACCU-CHEK GUIDE) w/Device KIT UAD 02/01/22   Lonell Grandchild, MD  Blood Glucose Monitoring Suppl DEVI 1 each by Does not apply route in the morning, at noon, and at bedtime. May substitute to any manufacturer covered by patient's insurance. 12/23/22   Carlus Pavlov, MD  cetirizine (EQ ALLERGY RELIEF, CETIRIZINE,) 10 MG tablet Take 1 tablet (10 mg total) by mouth daily. 10/30/22   Hetty Blend, FNP  cholecalciferol (VITAMIN D3) 25 MCG (1000 UNIT) tablet Take 1,000 Units by mouth daily.    [provider]  clonazePAM (KLONOPIN) 0.5 MG tablet Take 0.5 tablets (0.25 mg total) by mouth daily AND 1 tablet (0.5 mg total) at bedtime. Patient taking differently: Take 0.5 tablets (0.25 mg total) by mouth in the morning and 1 tablet (0.5 mg total) at bedtime for a total daily dose of 0.75mg .  03/17/23 03/16/24  Stasia Cavalier, MD  Clotrimazole 1 % OINT Apply to affected area BID 04/24/23   Marcine Matar, MD  Continuous Glucose Receiver (FREESTYLE LIBRE 2 READER) DEVI USE AS DIRECTED 03/06/23   Marcine Matar, MD  Continuous Glucose Sensor (FREESTYLE LIBRE 2 SENSOR) MISC Use to check blood sugar TID. Change sensor once every 14 days. E11.65 03/06/23   Hoy Register, MD  dapagliflozin propanediol (FARXIGA) 10 MG TABS tablet Take 1 tablet (10 mg total) by mouth daily before breakfast. 05/26/22   [name], Aditya, DO  doxycycline (VIBRA-TABS) 100 MG tablet Take 1 tablet (100 mg total) by mouth every 12 (twelve) hours. 04/07/23   Leroy Sea, MD  EPINEPHrine 0.3 mg/0.3 mL IJ SOAJ injection Inject 0.3 mg into the muscle as needed. 01/30/22   Alfonse Spruce, MD  famotidine (PEPCID) 20 MG tablet TAKE 1 TABLET BY MOUTH 1 TO 2 TIMES DAILY AS  NEEDED 04/20/23   Ambs, Norvel Richards, FNP  fluticasone (FLONASE) 50 MCG/ACT nasal spray Use 1-2 sprays in each nostril once a day as needed for nasal congestion. 01/30/22   Alfonse Spruce, MD  Fluticasone-Umeclidin-Vilant (TRELEGY ELLIPTA IN) Inhale 1 puff into the lungs daily.    [provider]  Fluticasone-Umeclidin-Vilant (TRELEGY ELLIPTA) 100-62.5-25 MCG/ACT AEPB Inhale 1 puff into the lungs daily. 04/16/23   Glenford Bayley, NP  gabapentin (NEURONTIN) 300 MG capsule Take 1 capsule by mouth at bedtime 10/15/22   Marcine Matar, MD  glucose blood (ACCU-CHEK GUIDE) test strip Use as instructed 02/01/22   Lonell Grandchild, MD  glucose blood (FREESTYLE LITE) test strip Use as instructed.  Please make sure this is compatible with [facility] 2 reader. 07/14/22   Marcine Matar, MD  insulin aspart (NOVOLOG) 100 UNIT/ML injection  Inject 24 Units into the skin 3 (three) times daily with meals.    [provider]  insulin glargine (LANTUS SOLOSTAR) 100 UNIT/ML Solostar Pen Inject 42 Units into the skin daily. 04/07/23   Leroy Sea, MD  Insulin Pen Needle (B-D ULTRAFINE III SHORT PEN) 31G X 8 MM MISC USE AS DIRECTED 03/10/23   Marcine Matar, MD  Lancet Devices [facility]) lancets 1 each by Other route 3 (three) times daily. 10/20/14   Funches, Gerilyn Nestle, MD  loperamide (IMODIUM) 2 MG capsule Take 1 capsule (2 mg total) by mouth as needed for diarrhea or loose stools. 04/07/23   Leroy Sea, MD  methocarbamol (ROBAXIN) 500 MG tablet Take 1 tablet (500 mg total) by mouth daily. 04/24/23   Marcine Matar, MD  metoprolol succinate (TOPROL-XL) 50 MG 24 hr tablet Take 1 tablet (50 mg total) by mouth daily. 08/22/22   [name], Aditya, DO  nystatin (MYCOSTATIN/NYSTOP) powder Apply topically 3 (three) times daily. 04/24/23   Marcine Matar, MD  sacubitril-valsartan (ENTRESTO) 24-26 MG Take 1 tablet by mouth 2 (two) times daily. 01/07/23   [name], Aditya, DO   sertraline (ZOLOFT) 50 MG tablet Take 1 tablet (50 mg total) by mouth daily. 03/17/23 03/16/24  Stasia Cavalier, MD  spironolactone (ALDACTONE) 25 MG tablet Take 0.5 tablets (12.5 mg total) by mouth daily. 05/26/22   [name], Aditya, DO  tamsulosin (FLOMAX) 0.4 MG CAPS capsule Take 2 capsules (0.8 mg total) by mouth daily. 01/27/23   Marcine Matar, MD  torsemide (DEMADEX) 20 MG tablet Take 4 tablets (80 mg total) by mouth daily. 02/23/23   Dorthula Nettles, DO    Social History   Socioeconomic History   Marital status: Single    Spouse name: Not on file   Number of children: Not on file   Years of education: Not on file   Highest education level: Not on file  Occupational History   Occupation: Disabled  Tobacco Use   Smoking status: Former    Current packs/day: 0.00    Average packs/day: 1 pack/day for 28.0 years (28.0 ttl pk-yrs)    Types: Cigarettes    Start date: 06/03/1986    Quit date: 06/03/2014    Years since quitting: 8.8   Smokeless tobacco: Never  Vaping Use   Vaping status: Never Used  Substance and Sexual Activity   Alcohol use: Yes   Drug use: Yes    Types: Marijuana    Comment: occasionally   Sexual activity: Yes  Other Topics Concern   Not on file  Social History Narrative   Pt lives with his brother    Social Determinants of Health   Financial Resource Strain: High Risk (03/24/2023)   Overall Financial Resource Strain (CARDIA)    Difficulty of Paying Living Expenses: Hard  Food Insecurity: No Food Insecurity (03/24/2023)   Hunger Vital Sign    Worried About Running Out of Food in the Last Year: Never true    Ran Out of Food in the Last Year: Never true  Transportation Needs: No Transportation Needs (03/24/2023)   PRAPARE - Administrator, Civil Service (Medical): No    Lack of Transportation (Non-Medical): No  Physical Activity: Not on file  Stress: Not on file  Social Connections: Moderately Integrated (03/12/2021)   Social  Connection and Isolation Panel [NHANES]    Frequency of Communication with Friends and Family: More than three times a week    Frequency of Social  Gatherings with Friends and Family: More than three times a week    Attends Religious Services: More than 4 times per year    Active Member of [employer] or Organizations: Yes    Attends Banker Meetings: Never    Marital Status: Never married  Intimate Partner Violence: Not At Risk (03/24/2023)   Humiliation, Afraid, Rape, and Kick questionnaire    Fear of Current or Ex-Partner: No    Emotionally Abused: No    Physically Abused: No    Sexually Abused: No    Family History  Problem Relation Age of Onset   Diabetes Father    Arthritis Father    Hypertension Father    Diabetes Mother    Arthritis Mother    Hypertension Mother    Hypertension Brother     Review of Systems  Constitutional: Negative.   HENT: Negative.    Eyes: Negative.   Respiratory: Negative.    Cardiovascular: Negative.   Gastrointestinal: Negative.   Musculoskeletal:        Left upper extremity edema and pain  Skin: Negative.   Neurological: Negative.   Endo/Heme/Allergies: Negative.   Psychiatric/Behavioral: Negative.        Physical Examination  Vitals:   04/24/23 1345 04/24/23 1648  BP: 100/74 109/80  Pulse: 77 90  Resp: 15 16  Temp: 98.2 F (36.8 C) 98.1 F (36.7 C)  SpO2: 98% 99%   There is no height or weight on file to calculate BMI.  Physical Exam HENT:     Head: Normocephalic.     Mouth/Throat:     Mouth: Mucous membranes are moist.  Eyes:     Pupils: Pupils are equal, round, and reactive to light.  Cardiovascular:     Rate and Rhythm: Normal rate.  Pulmonary:     Effort: Pulmonary effort is normal.  Abdominal:     General: Abdomen is flat.  Musculoskeletal:     Comments: Left upper extremity is edematous but compartments are soft  Skin:    General: Skin is warm.  Neurological:     General: No focal deficit present.      Mental Status: He is alert.     Comments: Left hand is sensorimotor intact although grip strength somewhat diminished due to edema in the hand  Psychiatric:        Mood and Affect: Mood normal.      CBC    Component Value Date/Time   WBC 6.7 04/24/2023 1257   RBC 4.77 04/24/2023 1257   HGB 14.9 04/24/2023 1257   HGB 14.0 08/26/2021 1608   HCT 45.1 04/24/2023 1257   HCT 40.9 08/26/2021 1608   PLT 178 04/24/2023 1257   PLT 286 08/26/2021 1608   MCV 94.5 04/24/2023 1257   MCV 88 08/26/2021 1608   MCH 31.2 04/24/2023 1257   MCHC 33.0 04/24/2023 1257   RDW 14.1 04/24/2023 1257   RDW 13.0 08/26/2021 1608   LYMPHSABS 2.5 04/05/2023 0315   LYMPHSABS 1.7 06/28/2018 1440   MONOABS 0.7 04/05/2023 0315   EOSABS 0.2 04/05/2023 0315   EOSABS 0.3 06/28/2018 1440   BASOSABS 0.1 04/05/2023 0315   BASOSABS 0.0 06/28/2018 1440    BMET    Component Value Date/Time   NA 139 04/24/2023 1257   NA 142 02/13/2023 1658   K 4.0 04/24/2023 1257   CL 103 04/24/2023 1257   CO2 23 04/24/2023 1257   GLUCOSE 170 (H) 04/24/2023 1257   BUN 27 (H) 04/24/2023 1257  BUN 26 (H) 02/13/2023 1658   CREATININE 2.58 (H) 04/24/2023 1257   CREATININE 1.68 (H) 04/24/2016 1502   CALCIUM 9.1 04/24/2023 1257   GFRNONAA 28 (L) 04/24/2023 1257   GFRNONAA >89 12/18/2014 1455   GFRAA 53 (L) 07/17/2020 1705   GFRAA >89 12/18/2014 1455    COAGS: Lab Results  Component Value Date   INR 1.1 04/04/2023   INR 1.2 (H) 08/08/2013   INR 1.08 07/29/2013     Non-Invasive Vascular Imaging:   Right Findings:  +----------+------------+---------+-----------+----------+-------+  RIGHT    CompressiblePhasicitySpontaneousPropertiesSummary  +----------+------------+---------+-----------+----------+-------+  Subclavian   Full       Yes       Yes                       +----------+------------+---------+-----------+----------+-------+     Left Findings:   +----------+------------+---------+-----------+----------+-------+  LEFT     CompressiblePhasicitySpontaneousPropertiesSummary  +----------+------------+---------+-----------+----------+-------+  IJV          None       No        No                        +----------+------------+---------+-----------+----------+-------+  Subclavian   None       Yes       No                        +----------+------------+---------+-----------+----------+-------+  Axillary     None       No        No                        +----------+------------+---------+-----------+----------+-------+  Brachial     None       No        No                        +----------+------------+---------+-----------+----------+-------+  Radial       Full                                           +----------+------------+---------+-----------+----------+-------+  Ulnar        Full                                           +----------+------------+---------+-----------+----------+-------+  Cephalic     None       No        No                        +----------+------------+---------+-----------+----------+-------+  Basilic      Full       Yes       Yes                       +----------+------------+---------+-----------+----------+-------+   Cephalic compressible in upper arm but occluded at the antecubital fossa  into the distal forearm.    Summary:    Left:  Findings consistent with acute deep vein thrombosis involving the left  internal jugular vein, left axillary vein, left subclavian vein and left brachial  veins.  Findings consistent with age indeterminate superficial vein  thrombosis  involving the left cephalic vein.      ASSESSMENT/PLAN: This is a 57 y.o. male with extensive left upper extremity DVT likely secondary to longstanding defibrillator.  Patient is anticoagulated ultimately evaluation by EP to determine options of defibrillator moving forward  as well as duration of anticoagulation.  He can see me as an outpatient on as needed basis.   Yuriko Portales C. Randie Heinz, MD [facility] Office: [phone] Pager: [phone]

## 2023-04-24 NOTE — Progress Notes (Signed)
ANTICOAGULATION CONSULT NOTE - Initial Consult  Pharmacy Consult for Heparin Indication: Left UE DVT  Allergies  Allergen Reactions   Bee Venom Anaphylaxis   Trulicity [Dulaglutide] Anaphylaxis and Diarrhea    Extra dose of Trulicity caused cardiac arrest. Patient experience onset of diarrhea with using this medication - cleared after med stopped.    Patient Measurements:   Heparin Dosing Weight: 107.9 kg  Vital Signs: Temp: 98.1 F (36.7 C) (11/15 1648) Temp Source: Oral (11/15 1648) BP: 109/80 (11/15 1648) Pulse Rate: 90 (11/15 1648)  Labs: Recent Labs    04/24/23 1257 04/24/23 1515  HGB 14.9  --   HCT 45.1  --   PLT 178  --   CREATININE 2.58*  --   CKTOTAL  --  210  TROPONINIHS 11 8    Estimated Creatinine Clearance: 44.1 mL/min (A) (by C-G formula based on SCr of 2.58 mg/dL (H)).   Medical History: Past Medical History:  Diagnosis Date   AICD (automatic cardioverter/defibrillator) present    Chronic bronchitis (HCC)    "get it most q yr" (12/23/2013)   Chronic systolic (congestive) heart failure (HCC)    Fracture of left humerus    a. 07/2013.   GERD (gastroesophageal reflux disease)    not at present time   Heart murmur    "born w/it"    History of renal calculi    Hypertension    Kidney stones    NICM (nonischemic cardiomyopathy) (HCC)    a. 07/2013 Echo: EF 20-25%, diff HK, Gr2 DD, mild MR, mod dil LA/RA. 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 10/24-29 admission for groin cellulitis/infection. Patient is presenting with swelling in left hand extending to arm and chest  area. Heparin per pharmacy consult placed for  Left UE DVT .  Patient is not on anticoagulation prior to arrival.  Hgb 14.9; plt 178  Goal of Therapy:  Heparin level 0.3-0.7 units/ml Monitor platelets by anticoagulation protocol: Yes   Plan:  Give IV heparin 6500 units bolus x 1 Start heparin infusion at 1800 units/hr Check anti-Xa level in 8 hours and daily while on heparin Continue to monitor H&H and platelets  Delmar Landau, PharmD, BCPS 04/24/2023 4:59 PM ED Clinical Pharmacist -  [phone]

## 2023-04-24 NOTE — ED Provider Notes (Signed)
Accepted handoff at shift change from [facility]. Please see prior provider note for full HPI.  Briefly: Patient is a 57 y.o. male who presents to the ER for swelling/redness of LE and chest x 2 days. Hx of defibrillator (2015) in setting of cardiomyopathy/CHF, sent over from PCP. Pulses dopplerable.   DDX/Plan: No WBC, low clinical concern for cellulitis. Pending [place] to r/o DVT, could consider follow up imaging with CT if [place] negative however poor renal function at baseline (creatinine 2.58, GFR 28)  ED Course / MDM    Medical Decision Making Amount and/or Complexity of Data Reviewed Labs: ordered. Radiology: ordered.  [place] with acute DVT involving the left internal jugular vein, left axillary vein, left subclavian vein, and left brachial veins.   1613 -- Consulted with vascular surgery PA who discussed with attending Dr Randie Heinz, they felt if the clot is on the same side as the defibrillator, usually they default to electrophysiology team for management due to concern for DVT coming from defibrillator.   1630 -- Consulted with Dr Herbie Baltimore with cardiology who agrees with admission for further evaluation and likely thrombectomy, but will touch base with EP team to guide treatment.   1720 -- Lisabeth Devoid PA and Dr. Royann Shivers with cardiology evaluated patient and recommend hospital admission, initiation of IV heparin, and anticipate transition to NOAC while admitted. Will consult hospitalist for admission.   1750 -- Consulted with hospitalist Dr Jacqulyn Bath who will admit. The patient appears reasonably stabilized for admission considering the current resources, flow, and capabilities available in the ED at this time, and I doubt any other [facility] requiring further screening and/or treatment in the ED prior to admission.   Jeanella Flattery 04/24/23 1753    Tegeler, Canary Brim, MD 04/25/23 0010

## 2023-04-24 NOTE — ED Notes (Addendum)
Pt provided with urinal and blanket. States chest pressure is increasing. Pt placed on cardiac leads to verify rhythm. EDP advised

## 2023-04-24 NOTE — Progress Notes (Signed)
Patient ID: DAWAN SCHWENKE, male    DOB: 1965/12/14  MRN: [id number]  CC: Hand Pain (Swelling of R hand radiating to R arm & chest "where defibrillator is at" , redness, pain x2 days/Requesting rx of Clotrimazole - given at ER for cellulitis/)   Subjective: Melakai Corella is a 57 y.o. male who presents for UC visit His concerns today include:  Patient with history of NICM, systolic CHF s/p ICD placement, DM type 2, HL, CKD stage 3,  obesity, BPH, GERD and chronic LBP secondary to spondylosis and spinal stenosis, COVID-19 infection, VT storm 04/2022.    Patient presents today complaining of pain, redness and swelling of the left arm x 2 days.  Started in the hand and then progressed all the way up and involves swelling of the left breast as well.  He has a defibrillator in place in the left upper chest.  He has not had any fever.  He has 2 hand chains on the left wrist that he has been unable to remove due to the swelling.  He has also noticed discomfort in the left side of the chest since yesterday that has been constant.  He has chronic shortness of breath and DOE that have not worsened.  He was recently hospitalized with groin cellulitis that had started as jocks itch.  He is requesting a prescription for clotrimazole ointment stating he was given this in the emergency room and he finds it helpful in decreasing odor in the groin area. Patient Active Problem List   Diagnosis Date Noted   Cellulitis of perineum 04/02/2023   Heart failure (HCC) 02/28/2023   Class 3 obesity 02/28/2023   SOB (shortness of breath) 02/27/2023   Loud snoring 10/28/2022   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   Hyperlipidemia associated with type 2 diabetes mellitus (HCC) 09/18/2022   PTSD (post-traumatic stress disorder) 07/10/2022   Panic attack 06/17/2022   Passive suicidal ideations 06/17/2022   History of ventricular tachycardia 06/14/2022   Essential hypertension 06/14/2022   Stage 3b chronic  kidney disease (HCC) 05/15/2022   Acute gout due to renal impairment involving left ankle 05/07/2022   Acute gout due to renal impairment involving right knee 05/07/2022   Hyponatremia 05/07/2022   Diarrhea 05/06/2022   Hyperglycemia 05/06/2022   Cellulitis of buttock 05/06/2022   Acute on chronic systolic CHF (congestive heart failure) (HCC) 05/06/2022   Acute renal failure superimposed on stage 3b chronic kidney disease (HCC) 05/06/2022   Hypokalemia 05/05/2022   Rectal abscess 05/05/2022   Left buttock abscess 05/05/2022   Cardiac arrest (HCC) 05/03/2022   AKI (acute kidney injury) (HCC) 05/03/2022   V-tach (HCC) 05/03/2022   CKD (chronic kidney disease) stage 2, GFR 60-89 ml/min 10/24/2019   Acute non-recurrent frontal sinusitis 06/27/2019   COVID-19 virus infection 06/27/2019   Glaucoma suspect 12/22/2018   Seasonal and perennial allergic rhinoconjunctivitis 10/25/2018   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   Urticaria 05/03/2018   S/P inguinal hernia repair 03/01/2018   Unilateral inguinal hernia without obstruction or gangrene 07/24/2017   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   Hypersomnia 02/28/2016   Abnormal PFT 02/24/2016   Dyspnea 01/28/2016   Diabetic polyneuropathy associated with type 2 diabetes mellitus (HCC) 12/18/2015   Lumbar back pain 12/18/2015   Type 2 diabetes mellitus with hyperlipidemia (HCC) 09/17/2015   Left median nerve neuropathy 04/16/2015   Lesion of right median nerve at forearm 04/16/2015   ICD (implantable cardioverter-defibrillator) in place 03/28/2014  Chronic systolic congestive heart failure (HCC) 03/28/2014   Nonischemic cardiomyopathy (HCC) 11/09/2013   Syncope 10/20/2013   Chest pain 07/28/2013   RENAL CALCULUS 05/13/2009   Obesity 05/08/2009   Former heavy cigarette smoker (20-39 per day) 05/08/2009   HOARSENESS 05/08/2009   GAD (generalized anxiety disorder) 02/28/2008   ASTHMATIC BRONCHITIS,  ACUTE 02/28/2008   GERD 02/28/2008   IRRITABLE BOWEL SYNDROME 02/28/2008     Current Outpatient Medications on File Prior to Visit  Medication Sig Dispense Refill   Accu-Chek Softclix Lancets lancets Use as instructed 100 each 12   acetaminophen (TYLENOL) 500 MG tablet Take 1,000 mg by mouth every 8 (eight) hours as needed.     albuterol (VENTOLIN HFA) 108 (90 Base) MCG/ACT inhaler Inhale 2 puffs into the lungs every 6 (six) hours as needed for wheezing or shortness of breath. 18 g 2   amiodarone (PACERONE) 200 MG tablet Take 1 tablet (200 mg total) by mouth daily. 90 tablet 3   aspirin 81 MG tablet Take 81 mg by mouth daily.     atorvastatin (LIPITOR) 10 MG tablet Take 1 tablet (10 mg total) by mouth daily. 90 tablet 1   Blood Glucose Monitoring Suppl (ACCU-CHEK GUIDE) w/Device KIT UAD 1 kit 0   Blood Glucose Monitoring Suppl DEVI 1 each by Does not apply route in the morning, at noon, and at bedtime. May substitute to any manufacturer covered by patient's insurance. 1 each 0   cetirizine (EQ ALLERGY RELIEF, CETIRIZINE,) 10 MG tablet Take 1 tablet (10 mg total) by mouth daily. 90 tablet 1   cholecalciferol (VITAMIN D3) 25 MCG (1000 UNIT) tablet Take 1,000 Units by mouth daily.     clonazePAM (KLONOPIN) 0.5 MG tablet Take 0.5 tablets (0.25 mg total) by mouth daily AND 1 tablet (0.5 mg total) at bedtime. (Patient taking differently: Take 0.5 tablets (0.25 mg total) by mouth in the morning and 1 tablet (0.5 mg total) at bedtime for a total daily dose of 0.75mg . ) 45 tablet 1   Continuous Glucose Receiver (FREESTYLE LIBRE 2 READER) DEVI USE AS DIRECTED 1 each 0   Continuous Glucose Sensor (FREESTYLE LIBRE 2 SENSOR) MISC Use to check blood sugar TID. Change sensor once every 14 days. E11.65 2 each 11   dapagliflozin propanediol (FARXIGA) 10 MG TABS tablet Take 1 tablet (10 mg total) by mouth daily before breakfast. 90 tablet 3   doxycycline (VIBRA-TABS) 100 MG tablet Take 1 tablet (100 mg total) by  mouth every 12 (twelve) hours. 10 tablet 0   EPINEPHrine 0.3 mg/0.3 mL IJ SOAJ injection Inject 0.3 mg into the muscle as needed. 1 each 1   famotidine (PEPCID) 20 MG tablet TAKE 1 TABLET BY MOUTH 1 TO 2 TIMES DAILY AS NEEDED 180 tablet 0   fluticasone (FLONASE) 50 MCG/ACT nasal spray Use 1-2 sprays in each nostril once a day as needed for nasal congestion. 16 g 5   Fluticasone-Umeclidin-Vilant (TRELEGY ELLIPTA IN) Inhale 1 puff into the lungs daily.     Fluticasone-Umeclidin-Vilant (TRELEGY ELLIPTA) 100-62.5-25 MCG/ACT AEPB Inhale 1 puff into the lungs daily. 60 each 1   gabapentin (NEURONTIN) 300 MG capsule Take 1 capsule by mouth at bedtime 90 capsule 1   glucose blood (ACCU-CHEK GUIDE) test strip Use as instructed 100 each 12   glucose blood (FREESTYLE LITE) test strip Use as instructed.  Please make sure this is compatible with [facility] 2 reader. 100 each 12   insulin aspart (NOVOLOG) 100 UNIT/ML injection Inject 24  Units into the skin 3 (three) times daily with meals.     insulin glargine (LANTUS SOLOSTAR) 100 UNIT/ML Solostar Pen Inject 42 Units into the skin daily.     Insulin Pen Needle (B-D ULTRAFINE III SHORT PEN) 31G X 8 MM MISC USE AS DIRECTED 100 each 6   Lancet Devices (ACCU-CHEK SOFTCLIX) lancets 1 each by Other route 3 (three) times daily. 1 each 0   loperamide (IMODIUM) 2 MG capsule Take 1 capsule (2 mg total) by mouth as needed for diarrhea or loose stools. 12 capsule 0   metoprolol succinate (TOPROL-XL) 50 MG 24 hr tablet Take 1 tablet (50 mg total) by mouth daily. 90 tablet 3   sacubitril-valsartan (ENTRESTO) 24-26 MG Take 1 tablet by mouth 2 (two) times daily. 60 tablet 5   sertraline (ZOLOFT) 50 MG tablet Take 1 tablet (50 mg total) by mouth daily. 90 tablet 0   spironolactone (ALDACTONE) 25 MG tablet Take 0.5 tablets (12.5 mg total) by mouth daily. 30 tablet 5   tamsulosin (FLOMAX) 0.4 MG CAPS capsule Take 2 capsules (0.8 mg total) by mouth daily. 180 capsule 1   torsemide  (DEMADEX) 20 MG tablet Take 4 tablets (80 mg total) by mouth daily. 120 tablet 3   No current facility-administered medications on file prior to visit.    Allergies  Allergen Reactions   Bee Venom Anaphylaxis   Trulicity [Dulaglutide] Anaphylaxis and Diarrhea    Extra dose of Trulicity caused cardiac arrest. Patient experience onset of diarrhea with using this medication - cleared after med stopped.    Social History   Socioeconomic History   Marital status: Single    Spouse name: Not on file   Number of children: Not on file   Years of education: Not on file   Highest education level: Not on file  Occupational History   Occupation: Disabled  Tobacco Use   Smoking status: Former    Current packs/day: 0.00    Average packs/day: 1 pack/day for 28.0 years (28.0 ttl pk-yrs)    Types: Cigarettes    Start date: 06/03/1986    Quit date: 06/03/2014    Years since quitting: 8.8   Smokeless tobacco: Never  Vaping Use   Vaping status: Never Used  Substance and Sexual Activity   Alcohol use: Yes   Drug use: Yes    Types: Marijuana    Comment: occasionally   Sexual activity: Yes  Other Topics Concern   Not on file  Social History Narrative   Pt lives with his brother    Social Determinants of Health   Financial Resource Strain: High Risk (03/24/2023)   Overall Financial Resource Strain (CARDIA)    Difficulty of Paying Living Expenses: Hard  Food Insecurity: No Food Insecurity (03/24/2023)   Hunger Vital Sign    Worried About Running Out of Food in the Last Year: Never true    Ran Out of Food in the Last Year: Never true  Transportation Needs: No Transportation Needs (03/24/2023)   PRAPARE - Administrator, Civil Service (Medical): No    Lack of Transportation (Non-Medical): No  Physical Activity: Not on file  Stress: Not on file  Social Connections: Moderately Integrated (03/12/2021)   Social Connection and Isolation Panel [NHANES]    Frequency of  Communication with Friends and Family: More than three times a week    Frequency of Social Gatherings with Friends and Family: More than three times a week    Attends Religious Services:  More than 4 times per year    Active Member of Clubs or Organizations: Yes    Attends Banker Meetings: Never    Marital Status: Never married  Intimate Partner Violence: Not At Risk (03/24/2023)   Humiliation, Afraid, Rape, and Kick questionnaire    Fear of Current or Ex-Partner: No    Emotionally Abused: No    Physically Abused: No    Sexually Abused: No    Family History  Problem Relation Age of Onset   Diabetes Father    Arthritis Father    Hypertension Father    Diabetes Mother    Arthritis Mother    Hypertension Mother    Hypertension Brother     Past Surgical History:  Procedure Laterality Date   CARDIAC CATHETERIZATION  08/10/13   CHOLECYSTECTOMY  ~ 2010   IMPLANTABLE CARDIOVERTER DEFIBRILLATOR IMPLANT  12/23/2013   STJ Fortify ICD implanted by Dr Johney Frame for cardiomyopathy and syncope   IMPLANTABLE CARDIOVERTER DEFIBRILLATOR IMPLANT N/A 12/23/2013   Procedure: IMPLANTABLE CARDIOVERTER DEFIBRILLATOR IMPLANT;  Surgeon: Gardiner Rhyme, MD;  Location: [facility] CATH LAB;  Service: Cardiovascular;  Laterality: N/A;   INGUINAL HERNIA REPAIR Left 03/01/2018   Procedure: OPEN REPAIR OF LEFT INGUINAL HERNIA WITH MESH;  Surgeon: Gaynelle Adu, MD;  Location: [facility] ORS;  Service: General;  Laterality: Left;   LEAD REVISION/REPAIR N/A 05/15/2022   Procedure: LEAD REVISION/REPAIR;  Surgeon: Lanier Prude, MD;  Location: [facility] INVASIVE CV LAB;  Service: Cardiovascular;  Laterality: N/A;   LEFT HEART CATHETERIZATION WITH CORONARY ANGIOGRAM N/A 08/10/2013   Procedure: LEFT HEART CATHETERIZATION WITH CORONARY ANGIOGRAM;  Surgeon: Corky Crafts, MD;  Location: [facility] CATH LAB;  Service: Cardiovascular;  Laterality: N/A;   ORIF HUMERUS FRACTURE Left 08/12/2013   Procedure: OPEN REDUCTION INTERNAL FIXATION  (ORIF) HUMERAL SHAFT FRACTURE;  Surgeon: Nadara Mustard, MD;  Location: [facility] OR;  Service: Orthopedics;  Laterality: Left;  Open Reduction Internal Fixation Left Humerus   RIGHT/LEFT HEART CATH AND CORONARY ANGIOGRAPHY N/A 05/12/2022   Procedure: RIGHT/LEFT HEART CATH AND CORONARY ANGIOGRAPHY;  Surgeon: Dorthula Nettles, DO;  Location: [facility] INVASIVE CV LAB;  Service: Cardiovascular;  Laterality: N/A;   URETEROSCOPY     "laser for kidney stones"    ROS: Review of Systems Negative except as stated above  PHYSICAL EXAM: BP 113/73 (BP Location: Right Arm, Patient Position: Sitting, Cuff Size: Large)   Pulse 63   Temp 98.5 F (36.9 C) (Oral)   Ht 6' (1.829 m)   Wt 294 lb (133.4 kg)   SpO2 92%   BMI 39.87 kg/m   Physical Exam   General appearance - alert, well appearing, and in no distress Mental status -patient with flat affect.  He appeared distressed with what is going on with his health Chest - clear to auscultation, no wheezes, rales or rhonchi, symmetric air entry Heart - normal rate, regular rhythm, normal S1, S2, no murmurs, rubs, clicks or gallops Extremities - left UE: Patient with significant swelling in the left upper extremity from the hand all the way up.  He is noted to have some redness of the arm.  Palm of the hand is also hyperemic Left breast is noted to be enlarged compared to the right.  No erythema noted over the area of defibrillator in LT upper chest.       Latest Ref Rng & Units 04/05/2023    3:15 AM 04/04/2023    6:39 AM 04/03/2023   10:46 AM  CMP  Glucose 70 - 99 mg/dL 161  096  045   BUN 6 - 20 mg/dL 43  42  41   Creatinine 0.61 - 1.24 mg/dL 4.09  8.11  9.14   Sodium 135 - 145 mmol/L 140  139  141   Potassium 3.5 - 5.1 mmol/L 4.3  4.5  4.0   Chloride 98 - 111 mmol/L 105  100  105   CO2 22 - 32 mmol/L 26  26  27    Calcium 8.9 - 10.3 mg/dL 9.6  9.2  9.2   Total Protein 6.5 - 8.1 g/dL   7.3   Total Bilirubin 0.3 - 1.2 mg/dL   0.5   Alkaline Phos 38 - 126  U/L   64   AST 15 - 41 U/L   18   ALT 0 - 44 U/L   24    Lipid Panel     Component Value Date/Time   CHOL 166 02/13/2023 1658   TRIG 283 (H) 02/13/2023 1658   HDL 44 02/13/2023 1658   CHOLHDL 3.8 02/13/2023 1658   CHOLHDL 4 05/08/2009 1040   VLDL 27.0 05/08/2009 1040   LDLCALC 76 02/13/2023 1658    CBC    Component Value Date/Time   WBC 7.8 04/05/2023 0315   RBC 4.42 04/05/2023 0315   HGB 13.6 04/05/2023 0315   HGB 14.0 08/26/2021 1608   HCT 41.6 04/05/2023 0315   HCT 40.9 08/26/2021 1608   PLT 271 04/05/2023 0315   PLT 286 08/26/2021 1608   MCV 94.1 04/05/2023 0315   MCV 88 08/26/2021 1608   MCH 30.8 04/05/2023 0315   MCHC 32.7 04/05/2023 0315   RDW 14.2 04/05/2023 0315   RDW 13.0 08/26/2021 1608   LYMPHSABS 2.5 04/05/2023 0315   LYMPHSABS 1.7 06/28/2018 1440   MONOABS 0.7 04/05/2023 0315   EOSABS 0.2 04/05/2023 0315   EOSABS 0.3 06/28/2018 1440   BASOSABS 0.1 04/05/2023 0315   BASOSABS 0.0 06/28/2018 1440    ASSESSMENT AND PLAN: 1. Pain, arm, left 2. Left arm swelling Patient presenting with pain, redness and swelling in the left hand, arm and breast.  Differential diagnoses include infection versus blood clot.  Hand chains removed from the wrist.  Patient advised to be seen in the emergency room.  3. Chest pain in adult Advised to be seen in the emergency room.  He is clinically stable.  His brother will transport him. I have called and spoken with the charge nurse in the ER.  Prescription sent for clotrimazole ointment and refill on Flexeril Patient was given the opportunity to ask questions.  Patient verbalized understanding of the plan and was able to repeat key elements of the plan.   This documentation was completed using Paediatric nurse.  Any transcriptional errors are unintentional.  No orders of the defined types were placed in this encounter.    Requested Prescriptions   Signed Prescriptions Disp Refills   methocarbamol  (ROBAXIN) 500 MG tablet 90 tablet 1    Sig: Take 1 tablet (500 mg total) by mouth daily.   nystatin (MYCOSTATIN/NYSTOP) powder 15 g 0    Sig: Apply topically 3 (three) times daily.   Clotrimazole 1 % OINT 56 g 0    Sig: Apply to affected area BID    No follow-ups on file.  Jonah Blue, MD, FACP

## 2023-04-24 NOTE — ED Notes (Signed)
Pt ambulated to bathroom without assistance 

## 2023-04-24 NOTE — Patient Instructions (Signed)
Please be seen in the emergency room at [facility].

## 2023-04-24 NOTE — Telephone Encounter (Signed)
  Chief Complaint: bilateral hands and fingers swollen. Left arm swollen up to shoulder hx diabetic Symptoms: left hand/ fingers swollen right hand and fingers swollen . Left arm swollen up to shoulder heavy to lift. Pain in arm. Fingers cool to touch yesterday . Can not make a fist. Difficulty lifting arm. Frequency: swelling started yesterday  Pertinent Negatives: Patient denies chest pain no difficulty breathing no fever no N/T reported left side of body or arm.  Disposition: [] ED /[] Urgent Care (no appt availability in office) / [x] Appointment(In office/virtual)/ []  [place] [facility]/ [] Home Care/ [] Refused Recommended Disposition /[] [place] [facility]/ []  Follow-up with PCP Additional Notes:   Appt today with PCP. Recommended patient go to ED and patient very upset and only wanted to see PCP . Per protocol patient to be seen within 4 hours appt at 1110 am today .       Reason for Disposition  Entire arm is swollen  Answer Assessment - Initial Assessment Questions 1. ONSET: "When did the pain start?"     Yesterday  2. LOCATION: "Where is the pain located?"     Left arm  3. PAIN: "How bad is the pain?" (Scale 1-10; or mild, moderate, severe)   - MILD (1-3): Doesn't interfere with normal activities.   - MODERATE (4-7): Interferes with normal activities (e.g., work or school) or awakens from sleep.   - SEVERE (8-10): Excruciating pain, unable to do any normal activities, unable to hold a cup of water.     Moderate left arm tight swollen  4. WORK OR EXERCISE: "Has there been any recent work or exercise that involved this part of the body?"     Na  5. CAUSE: "What do you think is causing the arm pain?"     Not sure  6. OTHER SYMPTOMS: "Do you have any other symptoms?" (e.g., neck pain, swelling, rash, fever, numbness, weakness)     Left hand and fingers swelling , left arm swollen up to shoulder 7. PREGNANCY: "Is there any chance you are pregnant?" "When was your last  menstrual period?"     na  Protocols used: Arm Pain-A-AH

## 2023-04-24 NOTE — Telephone Encounter (Signed)
noted 

## 2023-04-24 NOTE — ED Provider Triage Note (Addendum)
Emergency Medicine Provider Triage Evaluation Note  [name] , a 57 y.o. male  was evaluated in triage.  Pt complains of swollen arm Pt sent here by Dr. Laural Benes for possible dvt   Review of Systems  Positive: Swelling  Negative: fever  Physical Exam  BP 118/80   Pulse 69   Temp 98.2 F (36.8 C) (Oral)   Resp 18   SpO2 97%  Gen:   Awake, no distress   Resp:  Normal effort  MSK:   Swollen tender left arm Other:    Medical Decision Making  Medically screening exam initiated at 1:06 PM.  Appropriate orders placed.  [name] was informed that the remainder of the evaluation will be completed by another provider, this initial triage assessment does not replace that evaluation, and the importance of remaining in the ED until their evaluation is complete.     Elson Areas, PA-C 04/24/23 1307    Elson Areas, PA-C 04/24/23 1308

## 2023-04-24 NOTE — Consult Note (Addendum)
Cardiology Consultation   Patient ID: CORDAE CORNELIA MRN: [id number]; DOB: [birth year]  Admit date: 04/24/2023 Date of Consult: 04/24/2023  PCP:  Marcine Matar, MD   [facility] Providers Cardiologist:  Olga Millers, MD  Electrophysiologist:  Lanier Prude, MD       Patient Profile:   [name] is a 57 y.o. male with a hx of NICM s/p ICD (12/23/2013, addition of LA lead in 05/2022), VT, DM II, CKD stage IIIb-IV followed by Dr. Vallery Sa, COPD and history of normal coronary arteries on cath 2015 who is being seen 04/24/2023 for the evaluation of left arm DVT at the request of Dr. Rush Landmark.  History of Present Illness:   [name] is a 57 year old male with past medical history of NICM s/p ICD (12/23/2013, addition of LA lead in 05/2022), VT, DM II, CKD stage IIIb-IV followed by Dr. Vallery Sa, COPD and history of normal coronary arteries on cath 2015.  Patient was initially diagnosed with nonischemic cardiomyopathy in March 2015 after presented with congestive heart failure.  Cath showed normal coronary arteries.  Patient ultimately underwent implantation of [employer] Jude single-chamber ICD by Dr. Johney Frame on 12/23/2013.  He has been followed by heart failure service and a Dr. Lalla Brothers.  He was readmitted to the hospital in November 2023 for VT secondary to significant electrolyte derangement from underlying right gluteal abscess treated with IV antibiotic.  Throughout his admission, he had recurrent device shock for VT.  He underwent repeat left and right heart cath that showed normal coronary arteries, normal cardiac output and low filling pressure.  Echocardiogram obtained on 05/06/2022 showed EF 30 to 35%, grade 1 diastolic dysfunction, moderate LAE, no other significant valve issue.  He was taken back to the EP lab on 05/15/2022 and underwent RA lead addition as well as generator change out to [employer] DR device.  Additional RA lead is to help prevent the pacing  and improve heart rate.  Repeat echocardiogram obtained in March 2024 showed EF 20 to 25%, global hypokinesis, grade 1 DD, trivial MR.  More recently, patient was admitted in October was groin cellulitis after complaining of groin pain, redness and a foul-smelling odor.  CT scan negative for foreign years gangrene.  He was treated with IV antibiotics and discharged on 5 days of oral doxycycline and nystatin powder.  Since discharge, he had recurrence of foul-smelling odor from the groin and was prescribed another course of antifungal medication by his PCP Dr. Laural Benes.  He woke up 2 days ago with left arm pain and left swelling.  He has been having some vague chest discomfort intermittently, this does not occur with physical exertion.  He has chronic dyspnea on exertion due to underlying heart failure.  He gets short of breath taking a shower.  There has been no drastic increase in the dyspnea from his baseline recently left arm swelling worsened prompting the patient to seek urgent medical attention at [facility], ED.  Initial blood work showed creatinine of 2.58 which is near his baseline.  Serial troponin 11-->8.  Total CK2 210.  Red blood cell count normal.  Chest x-ray normal.  EKG showed normal sinus rhythm, poor R wave progression in the anterior leads.  Upper extremity venous duplex obtained in the emergency room showed acute DVT involving the left internal jugular vein, left axillary vein, left subclavian vein and the left brachial vein.  Per patient, he typically sleeps roughly 7 hours a night and that  he sleeps on his left side.  This is the first time he had left arm swelling since lead addition and the generator change out from December of last year.   Past Medical History:  Diagnosis Date   AICD (automatic cardioverter/defibrillator) present    Chronic bronchitis (HCC)    "get it most q yr" (12/23/2013)   Chronic systolic (congestive) heart failure (HCC)    Fracture of left humerus    a.  07/2013.   GERD (gastroesophageal reflux disease)    not at present time   Heart murmur    "born w/it"    History of renal calculi    Hypertension    Kidney stones    NICM (nonischemic cardiomyopathy) (HCC)    a. 07/2013 Echo: EF 20-25%, diff HK, Gr2 DD, mild MR, mod dil LA/RA. 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    Shockable heart rhythm detected by automated external defibrillator    Syncope    Type II diabetes mellitus (HCC) 2006    Past Surgical History:  Procedure Laterality Date   CARDIAC CATHETERIZATION  08/10/13   CHOLECYSTECTOMY  ~ 2010   IMPLANTABLE CARDIOVERTER DEFIBRILLATOR IMPLANT  12/23/2013   STJ Fortify ICD implanted by Dr Johney Frame for cardiomyopathy and syncope   IMPLANTABLE CARDIOVERTER DEFIBRILLATOR IMPLANT N/A 12/23/2013   Procedure: IMPLANTABLE CARDIOVERTER DEFIBRILLATOR IMPLANT;  Surgeon: Gardiner Rhyme, MD;  Location: [facility] CATH LAB;  Service: Cardiovascular;  Laterality: N/A;   INGUINAL HERNIA REPAIR Left 03/01/2018   Procedure: OPEN REPAIR OF LEFT INGUINAL HERNIA WITH MESH;  Surgeon: Gaynelle Adu, MD;  Location: [facility] ORS;  Service: General;  Laterality: Left;   LEAD REVISION/REPAIR N/A 05/15/2022   Procedure: LEAD REVISION/REPAIR;  Surgeon: Lanier Prude, MD;  Location: [facility] INVASIVE CV LAB;  Service: Cardiovascular;  Laterality: N/A;   LEFT HEART CATHETERIZATION WITH CORONARY ANGIOGRAM N/A 08/10/2013   Procedure: LEFT HEART CATHETERIZATION WITH CORONARY ANGIOGRAM;  Surgeon: Corky Crafts, MD;  Location: [facility] CATH LAB;  Service: Cardiovascular;  Laterality: N/A;   ORIF HUMERUS FRACTURE Left 08/12/2013   Procedure: OPEN REDUCTION INTERNAL FIXATION (ORIF) HUMERAL SHAFT FRACTURE;  Surgeon: Nadara Mustard, MD;  Location: [facility] OR;  Service: Orthopedics;  Laterality: Left;  Open Reduction Internal Fixation Left Humerus   RIGHT/LEFT HEART CATH AND CORONARY ANGIOGRAPHY N/A 05/12/2022   Procedure:  RIGHT/LEFT HEART CATH AND CORONARY ANGIOGRAPHY;  Surgeon: Dorthula Nettles, DO;  Location: [facility] INVASIVE CV LAB;  Service: Cardiovascular;  Laterality: N/A;   URETEROSCOPY     "laser for kidney stones"     Home Medications:  Prior to Admission medications   Medication Sig Start Date End Date Taking? Authorizing Provider  Accu-Chek Softclix Lancets lancets Use as instructed 02/01/22   Lonell Grandchild, MD  acetaminophen (TYLENOL) 500 MG tablet Take 1,000 mg by mouth every 8 (eight) hours as needed.    [provider]  albuterol (VENTOLIN HFA) 108 (90 Base) MCG/ACT inhaler Inhale 2 puffs into the lungs every 6 (six) hours as needed for wheezing or shortness of breath. 10/24/22   Glenford Bayley, NP  amiodarone (PACERONE) 200 MG tablet Take 1 tablet (200 mg total) by mouth daily. 08/15/22   Sheilah Pigeon, PA-C  aspirin 81 MG tablet Take 81 mg by mouth daily.    [provider]  atorvastatin (LIPITOR) 10 MG tablet Take 1 tablet (10 mg total) by mouth daily. 01/27/23  Marcine Matar, MD  Blood Glucose Monitoring Suppl (ACCU-CHEK GUIDE) w/Device KIT UAD 02/01/22   Lonell Grandchild, MD  Blood Glucose Monitoring Suppl DEVI 1 each by Does not apply route in the morning, at noon, and at bedtime. May substitute to any manufacturer covered by patient's insurance. 12/23/22   Carlus Pavlov, MD  cetirizine (EQ ALLERGY RELIEF, CETIRIZINE,) 10 MG tablet Take 1 tablet (10 mg total) by mouth daily. 10/30/22   Hetty Blend, FNP  cholecalciferol (VITAMIN D3) 25 MCG (1000 UNIT) tablet Take 1,000 Units by mouth daily.    [provider]  clonazePAM (KLONOPIN) 0.5 MG tablet Take 0.5 tablets (0.25 mg total) by mouth daily AND 1 tablet (0.5 mg total) at bedtime. Patient taking differently: Take 0.5 tablets (0.25 mg total) by mouth in the morning and 1 tablet (0.5 mg total) at bedtime for a total daily dose of 0.75mg .  03/17/23 03/16/24  Stasia Cavalier, MD  Clotrimazole 1 % OINT Apply  to affected area BID 04/24/23   Marcine Matar, MD  Continuous Glucose Receiver (FREESTYLE LIBRE 2 READER) DEVI USE AS DIRECTED 03/06/23   Marcine Matar, MD  Continuous Glucose Sensor (FREESTYLE LIBRE 2 SENSOR) MISC Use to check blood sugar TID. Change sensor once every 14 days. E11.65 03/06/23   Hoy Register, MD  dapagliflozin propanediol (FARXIGA) 10 MG TABS tablet Take 1 tablet (10 mg total) by mouth daily before breakfast. 05/26/22   [name], Aditya, DO  doxycycline (VIBRA-TABS) 100 MG tablet Take 1 tablet (100 mg total) by mouth every 12 (twelve) hours. 04/07/23   Leroy Sea, MD  EPINEPHrine 0.3 mg/0.3 mL IJ SOAJ injection Inject 0.3 mg into the muscle as needed. 01/30/22   Alfonse Spruce, MD  famotidine (PEPCID) 20 MG tablet TAKE 1 TABLET BY MOUTH 1 TO 2 TIMES DAILY AS NEEDED Patient taking differently: Take 20 mg by mouth See admin instructions. TAKE 1 TABLET BY MOUTH 1 TO 2 TIMES DAILY AS NEEDED 04/20/23   Ambs, Norvel Richards, FNP  fluticasone (FLONASE) 50 MCG/ACT nasal spray Use 1-2 sprays in each nostril once a day as needed for nasal congestion. 01/30/22   Alfonse Spruce, MD  Fluticasone-Umeclidin-Vilant (TRELEGY ELLIPTA IN) Inhale 1 puff into the lungs daily.    [provider]  Fluticasone-Umeclidin-Vilant (TRELEGY ELLIPTA) 100-62.5-25 MCG/ACT AEPB Inhale 1 puff into the lungs daily. 04/16/23   Glenford Bayley, NP  gabapentin (NEURONTIN) 300 MG capsule Take 1 capsule by mouth at bedtime 10/15/22   Marcine Matar, MD  glucose blood (ACCU-CHEK GUIDE) test strip Use as instructed 02/01/22   Lonell Grandchild, MD  glucose blood (FREESTYLE LITE) test strip Use as instructed.  Please make sure this is compatible with [facility] 2 reader. 07/14/22   Marcine Matar, MD  insulin aspart (NOVOLOG) 100 UNIT/ML injection Inject 24 Units into the skin 3 (three) times daily with meals.    [provider]  insulin glargine (LANTUS SOLOSTAR) 100 UNIT/ML Solostar  Pen Inject 42 Units into the skin daily. 04/07/23   Leroy Sea, MD  Insulin Pen Needle (B-D ULTRAFINE III SHORT PEN) 31G X 8 MM MISC USE AS DIRECTED 03/10/23   Marcine Matar, MD  ketoconazole (NIZORAL) 2 % cream Apply 1 Application topically daily. 04/14/23   [provider]  Lancet Devices [facility]) lancets 1 each by Other route 3 (three) times daily. 10/20/14   Funches, Gerilyn Nestle, MD  loperamide (IMODIUM) 2 MG capsule Take 1  capsule (2 mg total) by mouth as needed for diarrhea or loose stools. 04/07/23   Leroy Sea, MD  methocarbamol (ROBAXIN) 500 MG tablet Take 1 tablet (500 mg total) by mouth daily. 04/24/23   Marcine Matar, MD  metoprolol succinate (TOPROL-XL) 50 MG 24 hr tablet Take 1 tablet (50 mg total) by mouth daily. 08/22/22   [name], Aditya, DO  nystatin (MYCOSTATIN/NYSTOP) powder Apply topically 3 (three) times daily. 04/24/23   Marcine Matar, MD  sacubitril-valsartan (ENTRESTO) 24-26 MG Take 1 tablet by mouth 2 (two) times daily. 01/07/23   [name], Aditya, DO  sertraline (ZOLOFT) 50 MG tablet Take 1 tablet (50 mg total) by mouth daily. 03/17/23 03/16/24  Stasia Cavalier, MD  spironolactone (ALDACTONE) 25 MG tablet Take 0.5 tablets (12.5 mg total) by mouth daily. 05/26/22   [name], Aditya, DO  tamsulosin (FLOMAX) 0.4 MG CAPS capsule Take 2 capsules (0.8 mg total) by mouth daily. 01/27/23   Marcine Matar, MD  torsemide (DEMADEX) 20 MG tablet Take 4 tablets (80 mg total) by mouth daily. 02/23/23   Dorthula Nettles, DO    Inpatient Medications: Scheduled Meds:  heparin  6,500 Units Intravenous Once   Continuous Infusions:  heparin     PRN Meds:   Allergies:    Allergies  Allergen Reactions   Bee Venom Anaphylaxis   Trulicity [Dulaglutide] Anaphylaxis and Diarrhea    Extra dose of Trulicity caused cardiac arrest. Patient experience onset of diarrhea with using this medication - cleared after med stopped.    Social  History:   Social History   Socioeconomic History   Marital status: Single    Spouse name: Not on file   Number of children: Not on file   Years of education: Not on file   Highest education level: Not on file  Occupational History   Occupation: Disabled  Tobacco Use   Smoking status: Former    Current packs/day: 0.00    Average packs/day: 1 pack/day for 28.0 years (28.0 ttl pk-yrs)    Types: Cigarettes    Start date: 06/03/1986    Quit date: 06/03/2014    Years since quitting: 8.8   Smokeless tobacco: Never  Vaping Use   Vaping status: Never Used  Substance and Sexual Activity   Alcohol use: Yes   Drug use: Yes    Types: Marijuana    Comment: occasionally   Sexual activity: Yes  Other Topics Concern   Not on file  Social History Narrative   Pt lives with his brother    Social Determinants of Health   Financial Resource Strain: High Risk (03/24/2023)   Overall Financial Resource Strain (CARDIA)    Difficulty of Paying Living Expenses: Hard  Food Insecurity: No Food Insecurity (03/24/2023)   Hunger Vital Sign    Worried About Running Out of Food in the Last Year: Never true    Ran Out of Food in the Last Year: Never true  Transportation Needs: No Transportation Needs (03/24/2023)   PRAPARE - Administrator, Civil Service (Medical): No    Lack of Transportation (Non-Medical): No  Physical Activity: Not on file  Stress: Not on file  Social Connections: Moderately Integrated (03/12/2021)   Social Connection and Isolation Panel [NHANES]    Frequency of Communication with Friends and Family: More than three times a week    Frequency of Social Gatherings with Friends and Family: More than three times a week    Attends Religious Services: More than  4 times per year    Active Member of Clubs or Organizations: Yes    Attends Banker Meetings: Never    Marital Status: Never married  Intimate Partner Violence: Not At Risk (03/24/2023)   Humiliation,  Afraid, Rape, and Kick questionnaire    Fear of Current or Ex-Partner: No    Emotionally Abused: No    Physically Abused: No    Sexually Abused: No    Family History:    Family History  Problem Relation Age of Onset   Diabetes Father    Arthritis Father    Hypertension Father    Diabetes Mother    Arthritis Mother    Hypertension Mother    Hypertension Brother      ROS:  Please see the history of present illness.   All other ROS reviewed and negative.     Physical Exam/Data:   Vitals:   04/24/23 1252 04/24/23 1259 04/24/23 1345 04/24/23 1648  BP: 130/89 118/80 100/74 109/80  Pulse: 69  77 90  Resp: 18  15 16   Temp: 98.2 F (36.8 C)  98.2 F (36.8 C) 98.1 F (36.7 C)  TempSrc: Oral  Oral Oral  SpO2: 97%  98% 99%   No intake or output data in the 24 hours ending 04/24/23 1718    04/24/2023   11:25 AM 04/14/2023    3:10 PM 04/03/2023    8:00 AM  Last 3 Weights  Weight (lbs) 294 lb 293 lb 3.2 oz 300 lb  Weight (kg) 133.358 kg 132.995 kg 136.079 kg     There is no height or weight on file to calculate BMI.  General:  Well nourished, well developed, in no acute distress HEENT: normal Neck: no JVD Vascular: No carotid bruits; Distal pulses 2+ bilaterally Cardiac:  normal S1, S2; RRR; no murmur  Lungs:  clear to auscultation bilaterally, no wheezing, rhonchi or rales  Abd: soft, nontender, no hepatomegaly  Ext: no edema Musculoskeletal:  No deformities, BUE and BLE strength normal and equal Skin: warm and dry  Neuro:  CNs 2-12 intact, no focal abnormalities noted Psych:  Normal affect   EKG:  The EKG was personally reviewed and demonstrates: Normal sinus rhythm, poor R wave progression in the anterior leads. Telemetry:  Telemetry was personally reviewed and demonstrates:  NSR, no significant ventricular ectopy  Relevant CV Studies:  Echo 08/2022  1. Left ventricular ejection fraction, by estimation, is 20 to 25%. The  left ventricle has severely decreased  function. The left ventricle  demonstrates global hypokinesis. The left ventricular internal cavity size  was mildly dilated. There is moderate  concentric left ventricular hypertrophy. Left ventricular diastolic  parameters are consistent with Grade I diastolic dysfunction (impaired  relaxation).   2. Right ventricular systolic function is normal. The right ventricular  size is normal. Tricuspid regurgitation signal is inadequate for assessing  PA pressure.   3. The mitral valve is normal in structure. Trivial mitral valve  regurgitation. No evidence of mitral stenosis.   4. The aortic valve is tricuspid. Aortic valve regurgitation is not  visualized. No aortic stenosis is present.   5. Aortic dilatation noted. There is mild dilatation of the aortic root,  measuring 39 mm.   6. The inferior vena cava is normal in size with greater than 50%  respiratory variability, suggesting right atrial pressure of 3 mmHg.   Laboratory Data:  High Sensitivity Troponin:   Recent Labs  Lab 04/24/23 1257 04/24/23 1515  TROPONINIHS 11  8     Chemistry Recent Labs  Lab 04/24/23 1257  NA 139  K 4.0  CL 103  CO2 23  GLUCOSE 170*  BUN 27*  CREATININE 2.58*  CALCIUM 9.1  GFRNONAA 28*  ANIONGAP 13    No results for input(s): "PROT", "ALBUMIN", "AST", "ALT", "ALKPHOS", "BILITOT" in the last 168 hours. Lipids No results for input(s): "CHOL", "TRIG", "HDL", "LABVLDL", "LDLCALC", "CHOLHDL" in the last 168 hours.  Hematology Recent Labs  Lab 04/24/23 1257  WBC 6.7  RBC 4.77  HGB 14.9  HCT 45.1  MCV 94.5  MCH 31.2  MCHC 33.0  RDW 14.1  PLT 178   Thyroid No results for input(s): "TSH", "FREET4" in the last 168 hours.  BNPNo results for input(s): "BNP", "PROBNP" in the last 168 hours.  DDimer No results for input(s): "DDIMER" in the last 168 hours.   Radiology/Studies:  DG Chest 2 View  Result Date: 04/24/2023 CLINICAL DATA:  Chest pain. EXAM: CHEST - 2 VIEW COMPARISON:  Chest  radiograph dated March 16, 2023. FINDINGS: Cardiomegaly. Stable left subclavian dual lead pacemaker. Mild bibasilar atelectasis. No focal consolidation. No pleural effusion. No acute osseous abnormality. Partially visualized left humeral fixation plate and screw construct. IMPRESSION: Cardiomegaly.  No acute cardiopulmonary findings. Electronically Signed   By: Hart Robinsons M.D.   On: 04/24/2023 16:50   UE Venous Duplex ([facility] and [facility] ONLY)  Result Date: 04/24/2023 UPPER VENOUS STUDY  Patient Name:  ALFONSA TISDEL  Date of Exam:   04/24/2023 Medical Rec #: [id number]       Accession #:    [id number] Date of Birth: 1965-12-30       Patient Gender: M Patient Age:   20 years Exam Location:  [facility] Procedure:      VAS [place] UPPER EXTREMITY VENOUS DUPLEX Referring Phys: LESLIE SOFIA --------------------------------------------------------------------------------  Indications: Pain, Swelling, and Edema Limitations: Patient arm extremely rigid and could not be properly positioned. Comparison Study: No prior exam. Performing Technologist: Fernande Bras  Examination Guidelines: A complete evaluation includes B-mode imaging, spectral Doppler, color Doppler, and power Doppler as needed of all accessible portions of each vessel. Bilateral testing is considered an integral part of a complete examination. Limited examinations for reoccurring indications may be performed as noted.  Right Findings: +----------+------------+---------+-----------+----------+-------+ RIGHT     CompressiblePhasicitySpontaneousPropertiesSummary +----------+------------+---------+-----------+----------+-------+ Subclavian    Full       Yes       Yes                      +----------+------------+---------+-----------+----------+-------+  Left Findings: +----------+------------+---------+-----------+----------+-------+ LEFT      CompressiblePhasicitySpontaneousPropertiesSummary  +----------+------------+---------+-----------+----------+-------+ IJV           None       No        No                       +----------+------------+---------+-----------+----------+-------+ Subclavian    None       Yes       No                       +----------+------------+---------+-----------+----------+-------+ Axillary      None       No        No                       +----------+------------+---------+-----------+----------+-------+ Brachial  None       No        No                       +----------+------------+---------+-----------+----------+-------+ Radial        Full                                          +----------+------------+---------+-----------+----------+-------+ Ulnar         Full                                          +----------+------------+---------+-----------+----------+-------+ Cephalic      None       No        No                       +----------+------------+---------+-----------+----------+-------+ Basilic       Full       Yes       Yes                      +----------+------------+---------+-----------+----------+-------+ Cephalic compressible in upper arm but occluded at the antecubital fossa into the distal forearm.  Summary:  Left: Findings consistent with acute deep vein thrombosis involving the left internal jugular vein, left axillary vein, left subclavian vein and left brachial veins. Findings consistent with age indeterminate superficial vein thrombosis involving the left cephalic vein.  *See table(s) above for measurements and observations.    Preliminary      Assessment and Plan:   Left upper extremity DVT: Likely related to multiple venous lead in the left upper extremity venous system and patient sleeping on the left side.  Started on IV heparin for DVT dosing.  Patient complains of vague chest discomfort, however no significant worsening dyspnea.  He is not a candidate for CTA of the chest given  poor renal function.  Obtain repeat echocardiogram to make sure there is no significant clot burden inside the chamber of the heart.  Transition to DOAC in the next 48 hours.  History of nonischemic cardiomyopathy s/p ICD: Multiple device shock during hospitalization in November 2023 in the setting of electrolyte derangement.  Since addition of right atrial lead in December 2023, patient has not had any major device discharge  CKD stage IIIb-IV: Followed by Dr. Vallery Sa, renal function stable  DM 2: Stop Marcelline Deist given recent groin cellulitis in October 2024 and prior history of gluteal abscess in November 2023.  COPD: No sign of acute exacerbation.   Risk Assessment/Risk Scores:        New York Heart Association (NYHA) Functional Class NYHA Class III        For questions or updates, please contact [facility] Please consult [email or web address] for contact info under    Signed, Azalee Course, PA  04/24/2023 5:18 PM   I have seen and examined the patient along with Azalee Course, PA.  I have reviewed the chart, notes and new data.  I agree with PA/NP's note.  Key new complaints: upper extremity edema and facial fullness, new onset. Most recent procedure was addition of RA lead as upgrade to dual chamber ICD in December 2023. He has a history of VT storm remotely.  No  overt HF symptoms, but does have some orthostatic dizziness. Mild vague chest discomfort, but no true pleurisy or cough or hemoptysis. History of gluteal abscess. Now battling recurrent fungal intertrigo. On SGLT2 inh. Key examination changes: Obese. Breathing comfortably fully supine. RRR. Healthy ICD site. The left forearm and arm are moderately edematous, but do not feel "tight". Normal radial/ulnar pulses and distal perfusion. Left inner arm scar of previous surgery for humerus fracture. No facial plethora. Key new findings / data: US shows acute DVT as described above, not crossing the midline or involving the  SVC  PLAN: Keep arm elevated. Start anticoagulation with heparin and transition to DOAC. Check echo for intracardiac thrombus extension which could escalate plans to thrombus aspiration.Marland Kitchen Avoid contrast exposure due to CKD. Identification of small pulmonary emboli would not change the treatment strategy. I recommend stopping his SGLT2inh. He has extensive florid groin intertrigo requiring repeat therapy and has a history of gluteal abscess. I would not resume it until the fungal infection is quiescent, and even then reluctantly. He is at increased risk for serious perineal infection.  Thurmon Fair, MD, [facility] [phone] 04/24/2023, 5:49 PM

## 2023-04-24 NOTE — ED Triage Notes (Signed)
Pt c/o Chest pressure, swelling and redness to LUE  that staretd yesterday; denies hx of same; endorses sob, baseline; hx CHF, dm, defibrillator

## 2023-04-24 NOTE — ED Provider Notes (Cosign Needed Addendum)
[place] [facility]ERGENC[facility] DEPARTMENT AT [facility] Provider Note   CSN: [id number] Arrival date & time: 04/24/23  1244     History  No chief complaint on file.   [name] is a 57 y.o. male.  Patient with history of type 2 diabetes mellitus, nonischemic cardiomyopathy with chronic systolic CHF, ICD in place with prior shocks in the setting of VT, chronic kidney disease stage IIIb, COPD, obesity, recent admission to the hospital 10/24-29/2024 for groin cellulitis/infection.  Patient reports that 2 to 3 days ago he developed some swelling in his left hand which then extended up the arm into the chest area.  This was associated with some soreness and erythema.  No fevers.  No chest pain or shortness of breath.  He went to PCP today for follow-up and was referred to the emergency department for evaluation.  He is not anticoagulated.  No recent upper extremity injuries or falls.         Home Medications Prior to Admission medications   Medication Sig Start Date End Date Taking? Authorizing Provider  Accu-Chek Softclix Lancets lancets Use as instructed 02/01/22   Lonell Grandchild, MD  acetaminophen (TYLENOL) 500 MG tablet Take 1,000 mg by mouth every 8 (eight) hours as needed.    [provider]  albuterol (VENTOLIN HFA) 108 (90 Base) MCG/ACT inhaler Inhale 2 puffs into the lungs every 6 (six) hours as needed for wheezing or shortness of breath. 10/24/22   Glenford Bayley, NP  amiodarone (PACERONE) 200 MG tablet Take 1 tablet (200 mg total) by mouth daily. 08/15/22   Sheilah Pigeon, PA-C  aspirin 81 MG tablet Take 81 mg by mouth daily.    [provider]  atorvastatin (LIPITOR) 10 MG tablet Take 1 tablet (10 mg total) by mouth daily. 01/27/23   Marcine Matar, MD  Blood Glucose Monitoring Suppl (ACCU-CHEK GUIDE) w/Device KIT UAD 02/01/22   Lonell Grandchild, MD  Blood Glucose Monitoring Suppl DEVI 1 each by Does not apply route in the morning, at noon,  and at bedtime. May substitute to any manufacturer covered by patient's insurance. 12/23/22   Carlus Pavlov, MD  cetirizine (EQ ALLERGY RELIEF, CETIRIZINE,) 10 MG tablet Take 1 tablet (10 mg total) by mouth daily. 10/30/22   Hetty Blend, FNP  cholecalciferol (VITAMIN D3) 25 MCG (1000 UNIT) tablet Take 1,000 Units by mouth daily.    [provider]  clonazePAM (KLONOPIN) 0.5 MG tablet Take 0.5 tablets (0.25 mg total) by mouth daily AND 1 tablet (0.5 mg total) at bedtime. Patient taking differently: Take 0.5 tablets (0.25 mg total) by mouth in the morning and 1 tablet (0.5 mg total) at bedtime for a total daily dose of 0.75mg .  03/17/23 03/16/24  Stasia Cavalier, MD  Clotrimazole 1 % OINT Apply to affected area BID 04/24/23   Marcine Matar, MD  Continuous Glucose Receiver (FREESTYLE LIBRE 2 READER) DEVI USE AS DIRECTED 03/06/23   Marcine Matar, MD  Continuous Glucose Sensor (FREESTYLE LIBRE 2 SENSOR) MISC Use to check blood sugar TID. Change sensor once every 14 days. E11.65 03/06/23   Hoy Register, MD  dapagliflozin propanediol (FARXIGA) 10 MG TABS tablet Take 1 tablet (10 mg total) by mouth daily before breakfast. 05/26/22   [name], Aditya, DO  doxycycline (VIBRA-TABS) 100 MG tablet Take 1 tablet (100 mg total) by mouth every 12 (twelve) hours. 04/07/23   Leroy Sea, MD  EPINEPHrine 0.3 mg/0.3 mL IJ SOAJ  injection Inject 0.3 mg into the muscle as needed. 01/30/22   Alfonse Spruce, MD  famotidine (PEPCID) 20 MG tablet TAKE 1 TABLET BY MOUTH 1 TO 2 TIMES DAILY AS NEEDED 04/20/23   Ambs, Norvel Richards, FNP  fluticasone (FLONASE) 50 MCG/ACT nasal spray Use 1-2 sprays in each nostril once a day as needed for nasal congestion. 01/30/22   Alfonse Spruce, MD  Fluticasone-Umeclidin-Vilant (TRELEGY ELLIPTA IN) Inhale 1 puff into the lungs daily.    [provider]  Fluticasone-Umeclidin-Vilant (TRELEGY ELLIPTA) 100-62.5-25 MCG/ACT AEPB Inhale 1 puff into the lungs  daily. 04/16/23   Glenford Bayley, NP  gabapentin (NEURONTIN) 300 MG capsule Take 1 capsule by mouth at bedtime 10/15/22   Marcine Matar, MD  glucose blood (ACCU-CHEK GUIDE) test strip Use as instructed 02/01/22   Lonell Grandchild, MD  glucose blood (FREESTYLE LITE) test strip Use as instructed.  Please make sure this is compatible with [facility] reader. 07/14/22   Marcine Matar, MD  insulin aspart (NOVOLOG) 100 UNIT/ML injection Inject 24 Units into the skin 3 (three) times daily with meals.    [provider]  insulin glargine (LANTUS SOLOSTAR) 100 UNIT/ML Solostar Pen Inject 42 Units into the skin daily. 04/07/23   Leroy Sea, MD  Insulin Pen Needle (B-D ULTRAFINE III SHORT PEN) 31G X 8 MM MISC USE AS DIRECTED 03/10/23   Marcine Matar, MD  Lancet Devices [facility]) lancets 1 each by Other route 3 (three) times daily. 10/20/14   Funches, Gerilyn Nestle, MD  loperamide (IMODIUM) 2 MG capsule Take 1 capsule (2 mg total) by mouth as needed for diarrhea or loose stools. 04/07/23   Leroy Sea, MD  methocarbamol (ROBAXIN) 500 MG tablet Take 1 tablet (500 mg total) by mouth daily. 04/24/23   Marcine Matar, MD  metoprolol succinate (TOPROL-XL) 50 MG 24 hr tablet Take 1 tablet (50 mg total) by mouth daily. 08/22/22   [name], Aditya, DO  nystatin (MYCOSTATIN/NYSTOP) powder Apply topically 3 (three) times daily. 04/24/23   Marcine Matar, MD  sacubitril-valsartan (ENTRESTO) 24-26 MG Take 1 tablet by mouth 2 (two) times daily. 01/07/23   [name], Aditya, DO  sertraline (ZOLOFT) 50 MG tablet Take 1 tablet (50 mg total) by mouth daily. 03/17/23 03/16/24  Stasia Cavalier, MD  spironolactone (ALDACTONE) 25 MG tablet Take 0.5 tablets (12.5 mg total) by mouth daily. 05/26/22   [name], Aditya, DO  tamsulosin (FLOMAX) 0.4 MG CAPS capsule Take 2 capsules (0.8 mg total) by mouth daily. 01/27/23   Marcine Matar, MD  torsemide (DEMADEX) 20 MG tablet Take 4 tablets (80  mg total) by mouth daily. 02/23/23   [name], Eliezer Lofts, DO      Allergies    Bee venom and Trulicity [dulaglutide]    Review of Systems   Review of Systems  Physical Exam Updated Vital Signs BP 100/74   Pulse 77   Temp 98.2 F (36.8 C) (Oral)   Resp 15   SpO2 98%   Physical Exam Vitals and nursing note reviewed.  Constitutional:      Appearance: He is well-developed. He is not diaphoretic.  HENT:     Head: Normocephalic and atraumatic.     Mouth/Throat:     Mouth: Mucous membranes are not dry.  Eyes:     Conjunctiva/sclera: Conjunctivae normal.  Neck:     Vascular: Normal carotid pulses. No carotid bruit or JVD.     Trachea: Trachea normal. No  tracheal deviation.  Cardiovascular:     Rate and Rhythm: Normal rate and regular rhythm.     Pulses: No decreased pulses.          Radial pulses are 2+ on the right side and 2+ on the left side.     Heart sounds: Normal heart sounds, S1 normal and S2 normal. Heart sounds not distant. No murmur heard. Pulmonary:     Effort: Pulmonary effort is normal. No respiratory distress.     Breath sounds: Normal breath sounds. No wheezing.  Chest:     Chest wall: No tenderness.     Comments: Pacemaker pocket with healed surgical scar left upper chest wall, laterally.  No sign of abscess.  No focal tenderness.  No focal erythema.  Does not appear to be clinically infected.  No drainage. Abdominal:     General: Bowel sounds are normal.     Palpations: Abdomen is soft.     Tenderness: There is no abdominal tenderness. There is no guarding or rebound.  Musculoskeletal:     Left shoulder: Swelling present.     Left upper arm: Swelling, edema and tenderness present.     Left elbow: Normal range of motion.     Left forearm: Swelling and edema present.     Cervical back: Normal range of motion and neck supple. No muscular tenderness.     Right lower leg: No edema.     Left lower leg: No edema.     Comments: Radial pulses are difficult to  palpate, but readily dopplerable left sided radial and ulnar pulses with ultrasound.  Skin:    General: Skin is warm and dry.     Coloration: Skin is not pale.  Neurological:     Mental Status: He is alert. Mental status is at baseline.  Psychiatric:        Mood and Affect: Mood normal.     ED Results / Procedures / Treatments   Labs (all labs ordered are listed, but only abnormal results are displayed) Labs Reviewed  BASIC METABOLIC PANEL - Abnormal; Notable for the following components:      Result Value   Glucose, Bld 170 (*)    BUN 27 (*)    Creatinine, Ser 2.58 (*)    GFR, Estimated 28 (*)    All other components within normal limits  CBC  CK  TROPONIN I (HIGH SENSITIVITY)  TROPONIN I (HIGH SENSITIVITY)    ED ECG REPORT   Date: 04/24/2023  Rate: 67  Rhythm: normal sinus rhythm  QRS Axis: right  Intervals: normal  ST/T Wave abnormalities: nonspecific T wave changes  Conduction Disutrbances:none  Narrative Interpretation:   Old EKG Reviewed: unchanged  I have personally reviewed the EKG tracing and agree with the computerized printout as noted.   Radiology UE Venous Duplex ([facility] and [facility] ONLY)  Result Date: 04/24/2023 UPPER VENOUS STUDY  Patient Name:  [name]  Date of Exam:   04/24/2023 Medical Rec #: [id number]       Accession #:    [id number] Date of Birth: 08/08/1965       Patient Gender: M Patient Age:   31 years Exam Location[facility]  [facility] Procedure:      VAS [place] UPPER EXTREMITY VENOUS DUPLEX Referring Phys: LESLIE SOFIA --------------------------------------------------------------------------------  Indications: Pain, Swelling, and Edema Limitations: Patient arm extremely rigid and could not be properly positioned. Comparison Study: No prior exam. Performing Technologist: Fernande Bras  Examination Guidelines: A complete  evaluation includes B-mode imaging, spectral Doppler, color Doppler, and power Doppler as needed of all accessible portions of  each vessel. Bilateral testing is considered an integral part of a complete examination. Limited examinations for reoccurring indications may be performed as noted.  Right Findings: +----------+------------+---------+-----------+----------+-------+ RIGHT     CompressiblePhasicitySpontaneousPropertiesSummary +----------+------------+---------+-----------+----------+-------+ Subclavian    Full       Yes       Yes                      +----------+------------+---------+-----------+----------+-------+  Left Findings: +----------+------------+---------+-----------+----------+-------+ LEFT      CompressiblePhasicitySpontaneousPropertiesSummary +----------+------------+---------+-----------+----------+-------+ IJV           None       No        No                       +----------+------------+---------+-----------+----------+-------+ Subclavian    None       Yes       No                       +----------+------------+---------+-----------+----------+-------+ Axillary      None       No        No                       +----------+------------+---------+-----------+----------+-------+ Brachial      None       No        No                       +----------+------------+---------+-----------+----------+-------+ Radial        Full                                          +----------+------------+---------+-----------+----------+-------+ Ulnar         Full                                          +----------+------------+---------+-----------+----------+-------+ Cephalic      None       No        No                       +----------+------------+---------+-----------+----------+-------+ Basilic       Full       Yes       Yes                      +----------+------------+---------+-----------+----------+-------+ Cephalic compressible in upper arm but occluded at the antecubital fossa into the distal forearm.  Summary:  Left: Findings consistent with  acute deep vein thrombosis involving the left internal jugular vein, left axillary vein, left subclavian vein and left brachial veins. Findings consistent with age indeterminate superficial vein thrombosis involving the left cephalic vein.  *See table(s) above for measurements and observations.    Preliminary     Procedures Procedures    Medications Ordered in ED Medications - No data to display  ED Course/ Medical Decision Making/ A&P    Patient seen and examined. History obtained directly from patient.  Reviewed recent hospitalization notes from his groin infection.  Reviewed PCP notes.  Labs/EKG: Ordered CBC, BMP, troponin, CK.  Imaging: Ordered DVT study.  Medications/Fluids: None ordered  Most recent vital signs reviewed and are as follows: BP 100/74   Pulse 77   Temp 98.2 F (36.8 C) (Oral)   Resp 15   SpO2 98%   Initial impression: Left upper extremity swelling, concern for DVT, less likely cellulitis.  4:23 PM Reassessment performed. Patient appears stable.  Labs personally reviewed and interpreted including: CBC with normal white blood cell count, hemoglobin 14.9; BMP with glucose 170, creatinine elevated at 2.58 with a BUN of 27, normal potassium; troponin and CK pending.  Imaging is also personally reviewed: Patient with extensive left upper extremity DVT.    Reviewed pertinent lab work and imaging with patient at bedside. Questions answered.   Most current vital signs reviewed and are as follows: BP 100/74   Pulse 77   Temp 98.2 F (36.8 C) (Oral)   Resp 15   SpO2 98%   Plan: I initially spoke and consulted with vascular surgery.  They recommend discussing with electrophysiology as they typically handle these complications with DVT being on the same side as the pacemaker.  Signout to [place] PA-C at shift change.  Pending further consultation and admission.                                   Medical Decision Making Amount and/or Complexity of Data  Reviewed Labs: ordered. Radiology: ordered.   Patient with right upper extremity DVT, unclear instigating cause.  Awaiting vascular recommendations.  Patient stable.  Multiple chronic comorbidities.  Low concern for cellulitis.  No concern for arterial compromise at this time.        Final Clinical Impression(s) / ED Diagnoses Final diagnoses:  Acute deep vein thrombosis (DVT) of left upper extremity, unspecified vein [facility])    Rx / DC Orders ED Discharge Orders     None         Renne Crigler, PA-C 04/24/23 1625    Tegeler, Canary Brim, MD 04/25/23 0010

## 2023-04-24 NOTE — H&P (Signed)
History and Physical    [name] NWG:[id number] DOB: 1966/02/07 DOA: 04/24/2023  PCP: Marcine Matar, MD  Patient coming from: Home  I have personally briefly reviewed patient's old medical records in [facility]  Chief Complaint: Left arm swelling and pain  HPI: [name] is a 57 y.o. male with medical history significant of multiple comorbidities including CAD, hypertension, nonischemic cardiomyopathy, obesity, hypertension, type 2 diabetes mellitus, CKD stage IV presented to ED with complaint of left upper extremity swelling and pain that started 2 days ago.  It has been getting worse so he came to the ED.  He is also complaining of the left anterior chest pain.  He further clarifies that due to history of CAD, he always has intermittent chest pain but for past 2 days, it has been more frequent and more intense and he characterizes it as pressure-like, 6-8 out of 10.  He also has chronic shortness of breath but more so lately.  It gets worse with exertion and laying flat.  Patient denies any history of fever, chills, sweating, palpitation, any problem with urination or with bowel movement.  No recent travel.  No previous history of blood clots.  Patient sleeps on the left side.  ED Course: Upon arrival, hemodynamically stable.  Labs at his baseline with chronic kidney disease.  Was diagnosed with left upper extremity extensive DVT.  Vascular surgery was consulted however per ED physician, due to patient having history of ICD, vascular surgery recommended consulting cardiology as they like to manage this because his left upper extremity is quite extensive.  Cardiology was consulted.  Patient started on heparin.  Hospitalist were called for admission.  Review of Systems: As per HPI otherwise negative.    Past Medical History:  Diagnosis Date   AICD (automatic cardioverter/defibrillator) present    Chronic bronchitis (HCC)    "get it most q yr" (12/23/2013)   Chronic  systolic (congestive) heart failure (HCC)    Fracture of left humerus    a. 07/2013.   GERD (gastroesophageal reflux disease)    not at present time   Heart murmur    "born w/it"    History of renal calculi    Hypertension    Kidney stones    NICM (nonischemic cardiomyopathy) (HCC)    a. 07/2013 Echo: EF 20-25%, diff HK, Gr2 DD, mild MR, mod dil LA/RA. 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    Shockable heart rhythm detected by automated external defibrillator    Syncope    Type II diabetes mellitus (HCC) 2006    Past Surgical History:  Procedure Laterality Date   CARDIAC CATHETERIZATION  08/10/13   CHOLECYSTECTOMY  ~ 2010   IMPLANTABLE CARDIOVERTER DEFIBRILLATOR IMPLANT  12/23/2013   STJ Fortify ICD implanted by Dr Johney Frame for cardiomyopathy and syncope   IMPLANTABLE CARDIOVERTER DEFIBRILLATOR IMPLANT N/A 12/23/2013   Procedure: IMPLANTABLE CARDIOVERTER DEFIBRILLATOR IMPLANT;  Surgeon: Gardiner Rhyme, MD;  Location: [facility] CATH LAB;  Service: Cardiovascular;  Laterality: N/A;   INGUINAL HERNIA REPAIR Left 03/01/2018   Procedure: OPEN REPAIR OF LEFT INGUINAL HERNIA WITH MESH;  Surgeon: Gaynelle Adu, MD;  Location: [facility] ORS;  Service: General;  Laterality: Left;   LEAD REVISION/REPAIR N/A 05/15/2022   Procedure: LEAD REVISION/REPAIR;  Surgeon: Lanier Prude, MD;  Location: [facility] INVASIVE CV LAB;  Service: Cardiovascular;  Laterality: N/A;   LEFT HEART CATHETERIZATION WITH CORONARY  ANGIOGRAM N/A 08/10/2013   Procedure: LEFT HEART CATHETERIZATION WITH CORONARY ANGIOGRAM;  Surgeon: Corky Crafts, MD;  Location: [facility] CATH LAB;  Service: Cardiovascular;  Laterality: N/A;   ORIF HUMERUS FRACTURE Left 08/12/2013   Procedure: OPEN REDUCTION INTERNAL FIXATION (ORIF) HUMERAL SHAFT FRACTURE;  Surgeon: Nadara Mustard, MD;  Location: [facility] OR;  Service: Orthopedics;  Laterality: Left;  Open Reduction Internal Fixation Left Humerus    RIGHT/LEFT HEART CATH AND CORONARY ANGIOGRAPHY N/A 05/12/2022   Procedure: RIGHT/LEFT HEART CATH AND CORONARY ANGIOGRAPHY;  Surgeon: Dorthula Nettles, DO;  Location: [facility] INVASIVE CV LAB;  Service: Cardiovascular;  Laterality: N/A;   URETEROSCOPY     "laser for kidney stones"     reports that he quit smoking about 8 years ago. His smoking use included cigarettes. He started smoking about 36 years ago. He has a 28 pack-year smoking history. He has never used smokeless tobacco. He reports current alcohol use. He reports current drug use. Drug: Marijuana.  Allergies  Allergen Reactions   Bee Venom Anaphylaxis   Trulicity [Dulaglutide] Anaphylaxis and Diarrhea    Extra dose of Trulicity caused cardiac arrest. Patient experience onset of diarrhea with using this medication - cleared after med stopped.    Family History  Problem Relation Age of Onset   Diabetes Father    Arthritis Father    Hypertension Father    Diabetes Mother    Arthritis Mother    Hypertension Mother    Hypertension Brother     Prior to Admission medications   Medication Sig Start Date End Date Taking? Authorizing Provider  Accu-Chek Softclix Lancets lancets Use as instructed 02/01/22   Lonell Grandchild, MD  acetaminophen (TYLENOL) 500 MG tablet Take 1,000 mg by mouth every 8 (eight) hours as needed.    [provider]  albuterol (VENTOLIN HFA) 108 (90 Base) MCG/ACT inhaler Inhale 2 puffs into the lungs every 6 (six) hours as needed for wheezing or shortness of breath. 10/24/22   Glenford Bayley, NP  amiodarone (PACERONE) 200 MG tablet Take 1 tablet (200 mg total) by mouth daily. 08/15/22   Sheilah Pigeon, PA-C  aspirin 81 MG tablet Take 81 mg by mouth daily.    [provider]  atorvastatin (LIPITOR) 10 MG tablet Take 1 tablet (10 mg total) by mouth daily. 01/27/23   Marcine Matar, MD  Blood Glucose Monitoring Suppl (ACCU-CHEK GUIDE) w/Device KIT UAD 02/01/22   Lonell Grandchild, MD  Blood  Glucose Monitoring Suppl DEVI 1 each by Does not apply route in the morning, at noon, and at bedtime. May substitute to any manufacturer covered by patient's insurance. 12/23/22   Carlus Pavlov, MD  cetirizine (EQ ALLERGY RELIEF, CETIRIZINE,) 10 MG tablet Take 1 tablet (10 mg total) by mouth daily. 10/30/22   Hetty Blend, FNP  cholecalciferol (VITAMIN D3) 25 MCG (1000 UNIT) tablet Take 1,000 Units by mouth daily.    [provider]  clonazePAM (KLONOPIN) 0.5 MG tablet Take 0.5 tablets (0.25 mg total) by mouth daily AND 1 tablet (0.5 mg total) at bedtime. Patient taking differently: Take 0.5 tablets (0.25 mg total) by mouth in the morning and 1 tablet (0.5 mg total) at bedtime for a total daily dose of 0.75mg .  03/17/23 03/16/24  Stasia Cavalier, MD  Clotrimazole 1 % OINT Apply to affected area BID 04/24/23   Marcine Matar, MD  Continuous Glucose Receiver (FREESTYLE LIBRE 2 READER) DEVI USE AS DIRECTED 03/06/23   Jonah Blue  B, MD  Continuous Glucose Sensor (FREESTYLE LIBRE 2 SENSOR) MISC Use to check blood sugar TID. Change sensor once every 14 days. E11.65 03/06/23   Hoy Register, MD  dapagliflozin propanediol (FARXIGA) 10 MG TABS tablet Take 1 tablet (10 mg total) by mouth daily before breakfast. 05/26/22   [name], Aditya, DO  doxycycline (VIBRA-TABS) 100 MG tablet Take 1 tablet (100 mg total) by mouth every 12 (twelve) hours. 04/07/23   Leroy Sea, MD  EPINEPHrine 0.3 mg/0.3 mL IJ SOAJ injection Inject 0.3 mg into the muscle as needed. 01/30/22   Alfonse Spruce, MD  famotidine (PEPCID) 20 MG tablet TAKE 1 TABLET BY MOUTH 1 TO 2 TIMES DAILY AS NEEDED Patient taking differently: Take 20 mg by mouth See admin instructions. TAKE 1 TABLET BY MOUTH 1 TO 2 TIMES DAILY AS NEEDED 04/20/23   Ambs, Norvel Richards, FNP  fluticasone (FLONASE) 50 MCG/ACT nasal spray Use 1-2 sprays in each nostril once a day as needed for nasal congestion. 01/30/22   Alfonse Spruce, MD   Fluticasone-Umeclidin-Vilant (TRELEGY ELLIPTA IN) Inhale 1 puff into the lungs daily.    [provider]  Fluticasone-Umeclidin-Vilant (TRELEGY ELLIPTA) 100-62.5-25 MCG/ACT AEPB Inhale 1 puff into the lungs daily. 04/16/23   Glenford Bayley, NP  gabapentin (NEURONTIN) 300 MG capsule Take 1 capsule by mouth at bedtime 10/15/22   Marcine Matar, MD  glucose blood (ACCU-CHEK GUIDE) test strip Use as instructed 02/01/22   Lonell Grandchild, MD  glucose blood (FREESTYLE LITE) test strip Use as instructed.  Please make sure this is compatible with [facility] 2 reader. 07/14/22   Marcine Matar, MD  insulin aspart (NOVOLOG) 100 UNIT/ML injection Inject 24 Units into the skin 3 (three) times daily with meals.    [provider]  insulin glargine (LANTUS SOLOSTAR) 100 UNIT/ML Solostar Pen Inject 42 Units into the skin daily. 04/07/23   Leroy Sea, MD  Insulin Pen Needle (B-D ULTRAFINE III SHORT PEN) 31G X 8 MM MISC USE AS DIRECTED 03/10/23   Marcine Matar, MD  ketoconazole (NIZORAL) 2 % cream Apply 1 Application topically daily. 04/14/23   [provider]  Lancet Devices [facility]) lancets 1 each by Other route 3 (three) times daily. 10/20/14   Funches, Gerilyn Nestle, MD  loperamide (IMODIUM) 2 MG capsule Take 1 capsule (2 mg total) by mouth as needed for diarrhea or loose stools. 04/07/23   Leroy Sea, MD  methocarbamol (ROBAXIN) 500 MG tablet Take 1 tablet (500 mg total) by mouth daily. 04/24/23   Marcine Matar, MD  metoprolol succinate (TOPROL-XL) 50 MG 24 hr tablet Take 1 tablet (50 mg total) by mouth daily. 08/22/22   [name], Aditya, DO  nystatin (MYCOSTATIN/NYSTOP) powder Apply topically 3 (three) times daily. 04/24/23   Marcine Matar, MD  sacubitril-valsartan (ENTRESTO) 24-26 MG Take 1 tablet by mouth 2 (two) times daily. 01/07/23   [name], Aditya, DO  sertraline (ZOLOFT) 50 MG tablet Take 1 tablet (50 mg total) by mouth daily. 03/17/23  03/16/24  Stasia Cavalier, MD  spironolactone (ALDACTONE) 25 MG tablet Take 0.5 tablets (12.5 mg total) by mouth daily. 05/26/22   [name], Aditya, DO  tamsulosin (FLOMAX) 0.4 MG CAPS capsule Take 2 capsules (0.8 mg total) by mouth daily. 01/27/23   Marcine Matar, MD  torsemide (DEMADEX) 20 MG tablet Take 4 tablets (80 mg total) by mouth daily. 02/23/23   Dorthula Nettles, DO    Physical Exam: Vitals:  04/24/23 1259 04/24/23 1345 04/24/23 1648 04/24/23 1728  BP: 118/80 100/74 109/80   Pulse:  77 90   Resp:  15 16   Temp:  98.2 F (36.8 C) 98.1 F (36.7 C)   TempSrc:  Oral Oral   SpO2:  98% 99%   Weight:    128.8 kg  Height:    6' (1.829 m)    Constitutional: NAD, calm, comfortable Vitals:   04/24/23 1259 04/24/23 1345 04/24/23 1648 04/24/23 1728  BP: 118/80 100/74 109/80   Pulse:  77 90   Resp:  15 16   Temp:  98.2 F (36.8 C) 98.1 F (36.7 C)   TempSrc:  Oral Oral   SpO2:  98% 99%   Weight:    128.8 kg  Height:    6' (1.829 m)   Eyes: PERRL, lids and conjunctivae normal, morbidly obese ENMT: Mucous membranes are moist. Posterior pharynx clear of any exudate or lesions.Normal dentition.  Neck: normal, supple, no masses, no thyromegaly Respiratory: clear to auscultation bilaterally, no wheezing, no crackles. Normal respiratory effort. No accessory muscle use.  Cardiovascular: Regular rate and rhythm, no murmurs / rubs / gallops. No extremity edema. 2+ pedal pulses. No carotid bruits.  Abdomen: no tenderness, no masses palpated. No hepatosplenomegaly. Bowel sounds positive.  Musculoskeletal:  moderate swelling in the left upper extremity which is tender to palpation. Skin: no rashes, lesions, ulcers. No induration Neurologic: CN 2-12 grossly intact. Sensation intact, DTR normal. Strength 5/5 in all 4.  Psychiatric: Normal judgment and insight. Alert and oriented x 3. Normal mood.    Labs on Admission: I have personally reviewed following labs and imaging  studies  CBC: Recent Labs  Lab 04/24/23 1257  WBC 6.7  HGB 14.9  HCT 45.1  MCV 94.5  PLT 178   Basic Metabolic Panel: Recent Labs  Lab 04/24/23 1257  NA 139  K 4.0  CL 103  CO2 23  GLUCOSE 170*  BUN 27*  CREATININE 2.58*  CALCIUM 9.1   GFR: Estimated Creatinine Clearance: 43.3 mL/min (A) (by C-G formula based on SCr of 2.58 mg/dL (H)). Liver Function Tests: No results for input(s): "AST", "ALT", "ALKPHOS", "BILITOT", "PROT", "ALBUMIN" in the last 168 hours. No results for input(s): "LIPASE", "AMYLASE" in the last 168 hours. No results for input(s): "AMMONIA" in the last 168 hours. Coagulation Profile: No results for input(s): "INR", "PROTIME" in the last 168 hours. Cardiac Enzymes: Recent Labs  Lab 04/24/23 1515  CKTOTAL 210   BNP (last 3 results) Recent Labs    07/23/22 1655  PROBNP 31.0   HbA1C: No results for input(s): "HGBA1C" in the last 72 hours. CBG: No results for input(s): "GLUCAP" in the last 168 hours. Lipid Profile: No results for input(s): "CHOL", "HDL", "LDLCALC", "TRIG", "CHOLHDL", "LDLDIRECT" in the last 72 hours. Thyroid Function Tests: No results for input(s): "TSH", "T4TOTAL", "FREET4", "T3FREE", "THYROIDAB" in the last 72 hours. Anemia Panel: No results for input(s): "VITAMINB12", "FOLATE", "FERRITIN", "TIBC", "IRON", "RETICCTPCT" in the last 72 hours. Urine analysis:    Component Value Date/Time   COLORURINE COLORLESS (A) 04/02/2023 1552   APPEARANCEUR CLEAR 04/02/2023 1552   APPEARANCEUR Clear 07/17/2020 1655   LABSPEC 1.008 04/02/2023 1552   PHURINE 5.0 04/02/2023 1552   GLUCOSEU >1,000 (A) 04/02/2023 1552   HGBUR NEGATIVE 04/02/2023 1552   BILIRUBINUR NEGATIVE 04/02/2023 1552   BILIRUBINUR negative 01/30/2022 1705   BILIRUBINUR Negative 07/17/2020 1655   KETONESUR NEGATIVE 04/02/2023 1552   PROTEINUR NEGATIVE 04/02/2023 1552  UROBILINOGEN 0.2 01/30/2022 1705   UROBILINOGEN 1.0 06/20/2013 1032   NITRITE NEGATIVE  04/02/2023 1552   LEUKOCYTESUR NEGATIVE 04/02/2023 1552    Radiological Exams on Admission: DG Chest 2 View  Result Date: 04/24/2023 CLINICAL DATA:  Chest pain. EXAM: CHEST - 2 VIEW COMPARISON:  Chest radiograph dated March 16, 2023. FINDINGS: Cardiomegaly. Stable left subclavian dual lead pacemaker. Mild bibasilar atelectasis. No focal consolidation. No pleural effusion. No acute osseous abnormality. Partially visualized left humeral fixation plate and screw construct. IMPRESSION: Cardiomegaly.  No acute cardiopulmonary findings. Electronically Signed   By: Hart Robinsons M.D.   On: 04/24/2023 16:50   UE Venous Duplex ([facility] and [facility] ONLY)  Result Date: 04/24/2023 UPPER VENOUS STUDY  Patient Name:  [name]  Date of Exam:   04/24/2023 Medical Rec #: [id number]       Accession #:    [id number] Date of Birth: Jan 26, 1966       Patient Gender: M Patient Age:   44 years Exam Location:  [facility] Procedure:      VAS [place] UPPER EXTREMITY VENOUS DUPLEX Referring Phys: LESLIE SOFIA --------------------------------------------------------------------------------  Indications: Pain, Swelling, and Edema Limitations: Patient arm extremely rigid and could not be properly positioned. Comparison Study: No prior exam. Performing Technologist: Fernande Bras  Examination Guidelines: A complete evaluation includes B-mode imaging, spectral Doppler, color Doppler, and power Doppler as needed of all accessible portions of each vessel. Bilateral testing is considered an integral part of a complete examination. Limited examinations for reoccurring indications may be performed as noted.  Right Findings: +----------+------------+---------+-----------+----------+-------+ RIGHT     CompressiblePhasicitySpontaneousPropertiesSummary +----------+------------+---------+-----------+----------+-------+ Subclavian    Full       Yes       Yes                       +----------+------------+---------+-----------+----------+-------+  Left Findings: +----------+------------+---------+-----------+----------+-------+ LEFT      CompressiblePhasicitySpontaneousPropertiesSummary +----------+------------+---------+-----------+----------+-------+ IJV           None       No        No                       +----------+------------+---------+-----------+----------+-------+ Subclavian    None       Yes       No                       +----------+------------+---------+-----------+----------+-------+ Axillary      None       No        No                       +----------+------------+---------+-----------+----------+-------+ Brachial      None       No        No                       +----------+------------+---------+-----------+----------+-------+ Radial        Full                                          +----------+------------+---------+-----------+----------+-------+ Ulnar         Full                                          +----------+------------+---------+-----------+----------+-------+  Cephalic      None       No        No                       +----------+------------+---------+-----------+----------+-------+ Basilic       Full       Yes       Yes                      +----------+------------+---------+-----------+----------+-------+ Cephalic compressible in upper arm but occluded at the antecubital fossa into the distal forearm.  Summary:  Left: Findings consistent with acute deep vein thrombosis involving the left internal jugular vein, left axillary vein, left subclavian vein and left brachial veins. Findings consistent with age indeterminate superficial vein thrombosis involving the left cephalic vein.  *See table(s) above for measurements and observations.    Preliminary     EKG: Independently reviewed.  Normal sinus rhythm.  Assessment/Plan Principal Problem:   Acute deep vein thrombosis (DVT) of left  upper extremity (HCC) Active Problems:   Chronic systolic congestive heart failure (HCC)   Asthma-COPD overlap syndrome (HCC)   Class 3 obesity   GAD (generalized anxiety disorder)   GERD   Chest pain   Diabetic polyneuropathy associated with type 2 diabetes mellitus (HCC)   BPH with obstruction/lower urinary tract symptoms   Essential hypertension   Hyperlipidemia associated with type 2 diabetes mellitus (HCC)   Left upper extremity extensive acute DVT: Seen by cardiology, starting on heparin.  Plan to transition to DOAC in 2 days.  Unable to get CT chest PE due to advanced renal disease.  Cardiology not recommending VQ scan.  They have ordered echo to look for intracardiac thrombus.  Advised to keep left upper extremity elevated.  Chest pain: Vague chest pain.  Likely secondary to extensive left upper extremity DVT.  Troponin x 2 negative.  Echo is ordered.  Cardiology on board.  Chronic systolic CHF/NICM/ICD in place/history of V. tach: Most recent procedure was addition of RA lead as upgrade to dual chamber ICD in December 2023. He has a history of VT storm remotely. No overt HF symptoms, but does have some orthostatic dizziness.  Cardiology on board and managing.  Resuming all home medications except SGLT2 as cardiology recommends stopping that.  Type 2 diabetes mellitus: Resuming home long-acting insulin, 42 units and placed on SSI.  Check hemoglobin A1c.  Hyperlipidemia: Resume statin.  CKD stage IV: GFR around 28 which is at his baseline with creatinine around 2.5-2.6.  Monitor closely.  BPH: Resume Flomax.  Class III obesity: Weight loss and dietary modification counseled.  COPD: Not in acute exacerbation.  He has already quit smoking.  Resume bronchodilators.  DVT prophylaxis: Heparin Code Status: Full code Family Communication: None present at bedside.  Plan of care discussed with patient in length and he verbalized understanding and agreed with it. Disposition Plan:  Discharge in 2 to 3 days Consults called: Cardiology  Hughie Closs MD [facility]  *Please note that this is a verbal dictation therefore any spelling or grammatical errors are due to the "[employer]agon [employer] One" system interpretation.  Please page via Amion and do not message via secure chat for urgent patient care matters. Secure chat can be used for non urgent patient care matters. 04/24/2023, 6:36 PM  To contact the attending provider between 7A-7P or the covering provider during after hours 7P-7A, please log into the web site [email or web address]

## 2023-04-24 NOTE — ED Notes (Signed)
ED TO INPATIENT HANDOFF REPORT  ED Nurse Name and Phone #:  Corliss Blacker, RN [phone]  S Name/Age/Gender [name] 57 y.o. male Room/Bed: 030C/030C  Code Status   Code Status: Full Code  Home/SNF/Other Home Patient oriented to: self, place, time, and situation Is this baseline? Yes   Triage Complete: Triage complete  Chief Complaint Acute deep vein thrombosis (DVT) of left upper extremity (HCC) [I82.622]  Triage Note Pt c/o Chest pressure, swelling and redness to LUE  that staretd yesterday; denies hx of same; endorses sob, baseline; hx CHF, dm, defibrillator   Allergies Allergies  Allergen Reactions   Bee Venom Anaphylaxis   Trulicity [Dulaglutide] Anaphylaxis and Diarrhea    Extra dose of Trulicity caused cardiac arrest. Patient experience onset of diarrhea with using this medication - cleared after med stopped.    Level of Care/Admitting Diagnosis ED Disposition     ED Disposition  Admit   Condition  --   Comment  Hospital Area: [facility] [[id number]]  Level of Care: Progressive [102]  Admit to Progressive based on following criteria: MULTISYSTEM THREATS such as stable sepsis, metabolic/electrolyte imbalance with or without encephalopathy that is responding to early treatment.  May admit patient to [facility] or [facility] if equivalent level of care is available:: No  Covid Evaluation: Asymptomatic - no recent exposure (last 10 days) testing not required  Diagnosis: Acute deep vein thrombosis (DVT) of left upper extremity [facility]) [[id number]]  Admitting Physician: Hughie Closs [[id number]]  Attending Physician: Hughie Closs [[id number]]  Certification:: I certify this patient will need inpatient services for at least 2 midnights  Expected Medical Readiness: 04/26/2023          B Medical/Surgery History Past Medical History:  Diagnosis Date   AICD (automatic cardioverter/defibrillator) present    Chronic bronchitis (HCC)    "get it most q yr"  (12/23/2013)   Chronic systolic (congestive) heart failure (HCC)    Fracture of left humerus    a. 07/2013.   GERD (gastroesophageal reflux disease)    not at present time   Heart murmur    "born w/it"    History of renal calculi    Hypertension    Kidney stones    NICM (nonischemic cardiomyopathy) (HCC)    a. 07/2013 Echo: EF 20-25%, diff HK, Gr2 DD, mild MR, mod dil LA/RA. 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    Shockable heart rhythm detected by automated external defibrillator    Syncope    Type II diabetes mellitus (HCC) 2006   Past Surgical History:  Procedure Laterality Date   CARDIAC CATHETERIZATION  08/10/13   CHOLECYSTECTOMY  ~ 2010   IMPLANTABLE CARDIOVERTER DEFIBRILLATOR IMPLANT  12/23/2013   STJ Fortify ICD implanted by Dr Johney Frame for cardiomyopathy and syncope   IMPLANTABLE CARDIOVERTER DEFIBRILLATOR IMPLANT N/A 12/23/2013   Procedure: IMPLANTABLE CARDIOVERTER DEFIBRILLATOR IMPLANT;  Surgeon: Gardiner Rhyme, MD;  Location: [facility] CATH LAB;  Service: Cardiovascular;  Laterality: N/A;   INGUINAL HERNIA REPAIR Left 03/01/2018   Procedure: OPEN REPAIR OF LEFT INGUINAL HERNIA WITH MESH;  Surgeon: Gaynelle Adu, MD;  Location: [facility] ORS;  Service: General;  Laterality: Left;   LEAD REVISION/REPAIR N/A 05/15/2022   Procedure: LEAD REVISION/REPAIR;  Surgeon: Lanier Prude, MD;  Location: [facility] INVASIVE CV LAB;  Service: Cardiovascular;  Laterality: N/A;   LEFT HEART CATHETERIZATION WITH CORONARY ANGIOGRAM N/A 08/10/2013   Procedure: LEFT  HEART CATHETERIZATION WITH CORONARY ANGIOGRAM;  Surgeon: Corky Crafts, MD;  Location: [facility] CATH LAB;  Service: Cardiovascular;  Laterality: N/A;   ORIF HUMERUS FRACTURE Left 08/12/2013   Procedure: OPEN REDUCTION INTERNAL FIXATION (ORIF) HUMERAL SHAFT FRACTURE;  Surgeon: Nadara Mustard, MD;  Location: [facility] OR;  Service: Orthopedics;  Laterality: Left;  Open Reduction Internal  Fixation Left Humerus   RIGHT/LEFT HEART CATH AND CORONARY ANGIOGRAPHY N/A 05/12/2022   Procedure: RIGHT/LEFT HEART CATH AND CORONARY ANGIOGRAPHY;  Surgeon: Dorthula Nettles, DO;  Location: [facility] INVASIVE CV LAB;  Service: Cardiovascular;  Laterality: N/A;   URETEROSCOPY     "laser for kidney stones"     A IV Location/Drains/Wounds Patient Lines/Drains/Airways Status     Active Line/Drains/Airways     Name Placement date Placement time Site Days   Peripheral IV 04/24/23 20 G 1.88" Right;Anterior Forearm 04/24/23  1700  Forearm  less than 1            Intake/Output Last 24 hours No intake or output data in the 24 hours ending 04/24/23 1916  Labs/Imaging Results for orders placed or performed during the hospital encounter of 04/24/23 (from the past 48 hour(s))  Basic metabolic panel     Status: Abnormal   Collection Time: 04/24/23 12:57 PM  Result Value Ref Range   Sodium 139 135 - 145 mmol/L   Potassium 4.0 3.5 - 5.1 mmol/L   Chloride 103 98 - 111 mmol/L   CO2 23 22 - 32 mmol/L   Glucose, Bld 170 (H) 70 - 99 mg/dL    Comment: Glucose reference range applies only to samples taken after fasting for at least 8 hours.   BUN 27 (H) 6 - 20 mg/dL   Creatinine, Ser 1.61 (H) 0.61 - 1.24 mg/dL   Calcium 9.1 8.9 - 09.6 mg/dL   GFR, Estimated 28 (L) >60 mL/min    Comment: (NOTE) Calculated using the CKD-EPI Creatinine Equation (2021)    Anion gap 13 5 - 15    Comment: Performed at [facility], [address] [place], [place] [address]  CBC     Status: None   Collection Time: 04/24/23 12:57 PM  Result Value Ref Range   WBC 6.7 4.0 - 10.5 K/uL   RBC 4.77 4.22 - 5.81 MIL/uL   Hemoglobin 14.9 13.0 - 17.0 g/dL   HCT 98.1 19.1 - 47.8 %   MCV 94.5 80.0 - 100.0 fL   MCH 31.2 26.0 - 34.0 pg   MCHC 33.0 30.0 - 36.0 g/dL   RDW 29.5 62.1 - 30.8 %   Platelets 178 150 - 400 K/uL   nRBC 0.0 0.0 - 0.2 %    Comment: Performed at [facility], [address], [place],  [place] [address]  Troponin I (High Sensitivity)     Status: None   Collection Time: 04/24/23 12:57 PM  Result Value Ref Range   Troponin I (High Sensitivity) 11 <18 ng/L    Comment: (NOTE) Elevated high sensitivity troponin I (hsTnI) values and significant  changes across serial measurements may suggest ACS but many other  chronic and acute conditions are known to elevate hsTnI results.  Refer to the "Links" section for chest pain algorithms and additional  guidance. Performed at [facility], [address] [place], [place] [address]   CK     Status: None   Collection Time: 04/24/23  3:15 PM  Result Value Ref Range   Total CK 210 49 -  397 U/L    Comment: Performed at [facility], [address] [place], [place] [address]  Troponin I (High Sensitivity)     Status: None   Collection Time: 04/24/23  3:15 PM  Result Value Ref Range   Troponin I (High Sensitivity) 8 <18 ng/L    Comment: (NOTE) Elevated high sensitivity troponin I (hsTnI) values and significant  changes across serial measurements may suggest ACS but many other  chronic and acute conditions are known to elevate hsTnI results.  Refer to the "Links" section for chest pain algorithms and additional  guidance. Performed at [facility], [address] [place], [place] [address]   Magnesium     Status: None   Collection Time: 04/24/23  3:15 PM  Result Value Ref Range   Magnesium 2.4 1.7 - 2.4 mg/dL    Comment: Performed at [facility], [address] [place], [place] [address]   *Note: Due to a large number of results and/or encounters for the requested time period, some results have not been displayed. A complete set of results can be found in Results Review.   UE Venous Duplex ([facility] and [facility] ONLY)  Result Date: 04/24/2023 UPPER VENOUS STUDY  Patient Name:  [name]  Date of Exam:   04/24/2023 Medical Rec #: [id number]       Accession #:    [id number] Date of Birth: 02-26-1966       Patient  Gender: M Patient Age:   63 years Exam Location:  [facility] Procedure:      VAS [place] UPPER EXTREMITY VENOUS DUPLEX Referring Phys: LESLIE SOFIA --------------------------------------------------------------------------------  Indications: Pain, Swelling, and Edema Limitations: Patient arm extremely rigid and could not be properly positioned. Comparison Study: No prior exam. Performing Technologist: Fernande Bras  Examination Guidelines: A complete evaluation includes B-mode imaging, spectral Doppler, color Doppler, and power Doppler as needed of all accessible portions of each vessel. Bilateral testing is considered an integral part of a complete examination. Limited examinations for reoccurring indications may be performed as noted.  Right Findings: +----------+------------+---------+-----------+----------+-------+ RIGHT     CompressiblePhasicitySpontaneousPropertiesSummary +----------+------------+---------+-----------+----------+-------+ Subclavian    Full       Yes       Yes                      +----------+------------+---------+-----------+----------+-------+  Left Findings: +----------+------------+---------+-----------+----------+-------+ LEFT      CompressiblePhasicitySpontaneousPropertiesSummary +----------+------------+---------+-----------+----------+-------+ IJV           None       No        No                       +----------+------------+---------+-----------+----------+-------+ Subclavian    None       Yes       No                       +----------+------------+---------+-----------+----------+-------+ Axillary      None       No        No                       +----------+------------+---------+-----------+----------+-------+ Brachial      None       No        No                       +----------+------------+---------+-----------+----------+-------+  Radial        Full                                           +----------+------------+---------+-----------+----------+-------+ Ulnar         Full                                          +----------+------------+---------+-----------+----------+-------+ Cephalic      None       No        No                       +----------+------------+---------+-----------+----------+-------+ Basilic       Full       Yes       Yes                      +----------+------------+---------+-----------+----------+-------+ Cephalic compressible in upper arm but occluded at the antecubital fossa into the distal forearm.  Summary:  Left: Findings consistent with acute deep vein thrombosis involving the left internal jugular vein, left axillary vein, left subclavian vein and left brachial veins. Findings consistent with age indeterminate superficial vein thrombosis involving the left cephalic vein.  *See table(s) above for measurements and observations.  Diagnosing physician: Lemar Livings MD Electronically signed by Lemar Livings MD on 04/24/2023 at 7:01:04 PM.    Final    DG Chest 2 View  Result Date: 04/24/2023 CLINICAL DATA:  Chest pain. EXAM: CHEST - 2 VIEW COMPARISON:  Chest radiograph dated March 16, 2023. FINDINGS: Cardiomegaly. Stable left subclavian dual lead pacemaker. Mild bibasilar atelectasis. No focal consolidation. No pleural effusion. No acute osseous abnormality. Partially visualized left humeral fixation plate and screw construct. IMPRESSION: Cardiomegaly.  No acute cardiopulmonary findings. Electronically Signed   By: Hart Robinsons M.D.   On: 04/24/2023 16:50    Pending Labs Unresulted Labs (From admission, onward)     Start     Ordered   04/25/23 0500  Heparin level (unfractionated)  Daily,   R      04/24/23 1702   04/25/23 0500  CBC  Tomorrow morning,   R        04/24/23 1825   04/25/23 0500  Basic metabolic panel  Tomorrow morning,   R        04/24/23 1825   04/25/23 0100  Heparin level (unfractionated)  Once-Timed,   URGENT         04/24/23 1702            Vitals/Pain Today's Vitals   04/24/23 1259 04/24/23 1345 04/24/23 1648 04/24/23 1728  BP: 118/80 100/74 109/80   Pulse:  77 90   Resp:  15 16   Temp:  98.2 F (36.8 C) 98.1 F (36.7 C)   TempSrc:  Oral Oral   SpO2:  98% 99%   Weight:    128.8 kg  Height:    6' (1.829 m)  PainSc:  5       Isolation Precautions No active isolations  Medications Medications  heparin ADULT infusion 100 units/mL (25000 units/263mL) (1,800 Units/hr Intravenous New Bag/Given 04/24/23 1721)  albuterol (PROVENTIL) (2.5 MG/3ML) 0.083% nebulizer solution 3 mL (has no administration in time range)  amiodarone (PACERONE) tablet 200 mg (  has no administration in time range)  aspirin tablet 81 mg (has no administration in time range)  atorvastatin (LIPITOR) tablet 10 mg (has no administration in time range)  cholecalciferol (VITAMIN D3) 25 MCG (1000 UNIT) tablet 1,000 Units (has no administration in time range)  famotidine (PEPCID) tablet 20 mg (has no administration in time range)  gabapentin (NEURONTIN) capsule 300 mg (has no administration in time range)  insulin glargine-yfgn (SEMGLEE) injection 42 Units (has no administration in time range)  methocarbamol (ROBAXIN) tablet 500 mg (has no administration in time range)  metoprolol succinate (TOPROL-XL) 24 hr tablet 50 mg (has no administration in time range)  sacubitril-valsartan (ENTRESTO) 24-26 mg per tablet (has no administration in time range)  sertraline (ZOLOFT) tablet 50 mg (has no administration in time range)  spironolactone (ALDACTONE) tablet 12.5 mg (has no administration in time range)  tamsulosin (FLOMAX) capsule 0.8 mg (has no administration in time range)  torsemide (DEMADEX) tablet 80 mg (has no administration in time range)  sodium chloride flush (NS) 0.9 % injection 3 mL (has no administration in time range)  acetaminophen (TYLENOL) tablet 650 mg (has no administration in time range)    Or  acetaminophen  (TYLENOL) suppository 650 mg (has no administration in time range)  ondansetron (ZOFRAN) tablet 4 mg (has no administration in time range)    Or  ondansetron (ZOFRAN) injection 4 mg (has no administration in time range)  HYDROmorphone (DILAUDID) injection 0.5-1 mg (has no administration in time range)  insulin aspart (novoLOG) injection 0-15 Units (has no administration in time range)  insulin aspart (novoLOG) injection 0-5 Units (has no administration in time range)  heparin bolus via infusion 6,500 Units (6,500 Units Intravenous Bolus from Bag 04/24/23 1721)    Mobility walks     Focused Assessments Cardiac Assessment Handoff:  Cardiac Rhythm: Normal sinus rhythm Lab Results  Component Value Date   CKTOTAL 210 04/24/2023   TROPONINI <0.03 04/02/2015   No results found for: "DDIMER" Does the Patient currently have chest pain? No    R Recommendations: See Admitting Provider Note  Report given to:   Additional Notes:

## 2023-04-25 DIAGNOSIS — I82A12 Acute embolism and thrombosis of left axillary vein: Secondary | ICD-10-CM | POA: Diagnosis not present

## 2023-04-25 DIAGNOSIS — I428 Other cardiomyopathies: Secondary | ICD-10-CM | POA: Diagnosis not present

## 2023-04-25 DIAGNOSIS — I82622 Acute embolism and thrombosis of deep veins of left upper extremity: Secondary | ICD-10-CM | POA: Diagnosis not present

## 2023-04-25 DIAGNOSIS — Z95810 Presence of automatic (implantable) cardiac defibrillator: Secondary | ICD-10-CM | POA: Diagnosis not present

## 2023-04-25 LAB — BASIC METABOLIC PANEL
Anion gap: 8 (ref 5–15)
BUN: 28 mg/dL — ABNORMAL HIGH (ref 6–20)
CO2: 27 mmol/L (ref 22–32)
Calcium: 8.7 mg/dL — ABNORMAL LOW (ref 8.9–10.3)
Chloride: 104 mmol/L (ref 98–111)
Creatinine, Ser: 2.5 mg/dL — ABNORMAL HIGH (ref 0.61–1.24)
GFR, Estimated: 29 mL/min — ABNORMAL LOW (ref >60)
Glucose, Bld: 134 mg/dL — ABNORMAL HIGH (ref 70–99)
Potassium: 3.7 mmol/L (ref 3.5–5.1)
Sodium: 139 mmol/L (ref 135–145)

## 2023-04-25 LAB — CBC
HCT: 42.1 % (ref 39.0–52.0)
Hemoglobin: 13.7 g/dL (ref 13.0–17.0)
MCH: 30.9 pg (ref 26.0–34.0)
MCHC: 32.5 g/dL (ref 30.0–36.0)
MCV: 94.8 fL (ref 80.0–100.0)
Platelets: 167 10*3/uL (ref 150–400)
RBC: 4.44 MIL/uL (ref 4.22–5.81)
RDW: 14.3 % (ref 11.5–15.5)
WBC: 5.7 10*3/uL (ref 4.0–10.5)
nRBC: 0 % (ref 0.0–0.2)

## 2023-04-25 LAB — GLUCOSE, CAPILLARY
Glucose-Capillary: 172 mg/dL — ABNORMAL HIGH (ref 70–99)
Glucose-Capillary: 183 mg/dL — ABNORMAL HIGH (ref 70–99)
Glucose-Capillary: 226 mg/dL — ABNORMAL HIGH (ref 70–99)
Glucose-Capillary: 203 mg/dL — ABNORMAL HIGH (ref 70–99)
Glucose-Capillary: 246 mg/dL — ABNORMAL HIGH (ref 70–99)

## 2023-04-25 LAB — HEPARIN LEVEL (UNFRACTIONATED)
Heparin Unfractionated: 0.7 [IU]/mL (ref 0.30–0.70)
Heparin Unfractionated: 0.75 [IU]/mL — ABNORMAL HIGH (ref 0.30–0.70)

## 2023-04-25 MED ORDER — NYSTATIN 100000 UNIT/GM EX POWD
Freq: Two times a day (BID) | CUTANEOUS | Status: DC
  Filled 2023-04-25: qty 15

## 2023-04-25 NOTE — Progress Notes (Addendum)
ANTICOAGULATION CONSULT NOTE   Pharmacy Consult for Heparin Indication: Left UE DVT  Allergies  Allergen Reactions   Bee Venom Anaphylaxis   Trulicity [Dulaglutide] Anaphylaxis and Diarrhea    Extra dose of Trulicity caused cardiac arrest. Patient experience onset of diarrhea with using this medication - cleared after med stopped.    Patient Measurements: Height: 6' (182.9 cm) Weight: 131.9 kg (290 lb 11.2 oz) IBW/kg (Calculated) : 77.6 Heparin Dosing Weight: 107.9 kg  Vital Signs: Temp: 97.7 F (36.5 C) (11/16 0300) Temp Source: Oral (11/16 0300) BP: 110/60 (11/16 0300) Pulse Rate: 65 (11/16 0300)  Labs: Recent Labs    04/24/23 1257 04/24/23 1515 04/25/23 0157  HGB 14.9  --  13.7  HCT 45.1  --  42.1  PLT 178  --  167  HEPARINUNFRC  --   --  0.70  CREATININE 2.58*  --  2.50*  CKTOTAL  --  210  --   TROPONINIHS 11 8  --     Estimated Creatinine Clearance: 45.2 mL/min (A) (by C-G formula based on SCr of 2.5 mg/dL (H)).   Medical History: Past Medical History:  Diagnosis Date   AICD (automatic cardioverter/defibrillator) present    Chronic bronchitis (HCC)    "get it most q yr" (12/23/2013)   Chronic systolic (congestive) heart failure (HCC)    Fracture of left humerus    a. 07/2013.   GERD (gastroesophageal reflux disease)    not at present time   Heart murmur    "born w/it"    History of renal calculi    Hypertension    Kidney stones    NICM (nonischemic cardiomyopathy) (HCC)    a. 07/2013 Echo: EF 20-25%, diff HK, Gr2 DD, mild MR, mod dil LA/RA. 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   04/24/2023   albuterol (VENTOLIN HFA) 108 (90 Base) MCG/ACT inhaler Inhale 2 puffs into the lungs every 6 (six) hours as needed for wheezing or shortness of breath. 18 g 2 Unk   amiodarone (PACERONE) 200 MG tablet Take 1 tablet (200 mg total) by mouth daily. 90 tablet 3 04/24/2023   aspirin 81 MG tablet Take 81 mg by mouth daily.   04/24/2023   atorvastatin (LIPITOR) 10 MG tablet Take 1 tablet (10 mg total) by mouth daily. 90 tablet 1 04/24/2023   cetirizine (EQ ALLERGY RELIEF, CETIRIZINE,) 10 MG tablet Take 1 tablet (10 mg total) by mouth daily. (Patient taking differently: Take 10 mg by mouth in the morning.) 90 tablet 1 04/24/2023   cholecalciferol (VITAMIN D3) 25 MCG (1000 UNIT) tablet Take 1,000 Units by mouth in the morning.   04/24/2023   clonazePAM (KLONOPIN) 0.5 MG tablet Take 0.5 tablets (0.25 mg total) by mouth daily AND 1 tablet (0.5 mg total) at bedtime. (Patient taking differently: Take 0.5 tablets (0.25 mg total) by mouth in the morning and 1 tablet (0.5 mg total) at bedtime for a total daily dose of 0.75mg . ) 45 tablet 1 04/24/2023   dapagliflozin propanediol (FARXIGA) 10 MG TABS tablet Take 1 tablet (10 mg total) by  mouth daily before breakfast. 90 tablet 3 04/24/2023   doxycycline (VIBRA-TABS) 100 MG tablet Take 1 tablet (100 mg total) by mouth every 12 (twelve) hours. 10 tablet 0 04/24/2023   EPINEPHrine 0.3 mg/0.3 mL IJ SOAJ injection Inject 0.3 mg into the muscle as needed. 1 each 1 Unk   famotidine (PEPCID) 20 MG tablet TAKE 1 TABLET BY MOUTH 1 TO 2 TIMES DAILY AS NEEDED (Patient taking differently: Take 20 mg by mouth See admin instructions. TAKE 1 TABLET BY MOUTH 1 TO 2 TIMES DAILY AS NEEDED) 180 tablet 0 04/24/2023   fluticasone (FLONASE) 50 MCG/ACT nasal spray Use 1-2 sprays in each nostril once a day as needed for nasal congestion. 16 g 5 Unk   Fluticasone-Umeclidin-Vilant (TRELEGY ELLIPTA IN) Inhale 1 puff into the lungs daily.   04/24/2023    Fluticasone-Umeclidin-Vilant (TRELEGY ELLIPTA) 100-62.5-25 MCG/ACT AEPB Inhale 1 puff into the lungs daily. 60 each 1 04/24/2023   gabapentin (NEURONTIN) 300 MG capsule Take 1 capsule by mouth at bedtime 90 capsule 1 04/23/2023   insulin aspart (NOVOLOG) 100 UNIT/ML injection Inject 24 Units into the skin 3 (three) times daily with meals.   04/24/2023   insulin glargine (LANTUS SOLOSTAR) 100 UNIT/ML Solostar Pen Inject 42 Units into the skin daily.   04/24/2023   loperamide (IMODIUM) 2 MG capsule Take 1 capsule (2 mg total) by mouth as needed for diarrhea or loose stools. 12 capsule 0 Unk   methocarbamol (ROBAXIN) 500 MG tablet Take 1 tablet (500 mg total) by mouth daily. (Patient taking differently: Take 500 mg by mouth at bedtime.) 90 tablet 1 04/23/2023   metoprolol succinate (TOPROL-XL) 50 MG 24 hr tablet Take 1 tablet (50 mg total) by mouth daily. (Patient taking differently: Take 50 mg by mouth in the morning.) 90 tablet 3 04/24/2023   nystatin (MYCOSTATIN/NYSTOP) powder Apply topically 3 (three) times daily. 15 g 0 04/24/2023   sacubitril-valsartan (ENTRESTO) 24-26 MG Take 1 tablet by mouth 2 (two) times daily. 60 tablet 5 04/24/2023   sertraline (ZOLOFT) 50 MG tablet Take 1 tablet (50 mg total) by mouth daily. (Patient taking differently: Take 50 mg by mouth in the morning.) 90 tablet 0 04/24/2023   spironolactone (ALDACTONE) 25 MG tablet Take 0.5 tablets (12.5 mg total) by mouth daily. (Patient taking differently: Take 12.5 mg by mouth in the morning.) 30 tablet 5 04/24/2023   tamsulosin (FLOMAX) 0.4 MG CAPS capsule Take 2 capsules (0.8 mg total) by mouth daily. (Patient taking differently: Take 0.8 mg by mouth in the morning.) 180 capsule 1 04/24/2023   torsemide (DEMADEX) 20 MG tablet Take 4 tablets (80 mg total) by mouth daily. (Patient taking differently: Take 80 mg by mouth in the morning.) 120 tablet 3 04/24/2023   Accu-Chek Softclix Lancets lancets Use as instructed 100 each 12    Blood  Glucose Monitoring Suppl (ACCU-CHEK GUIDE) w/Device KIT UAD 1 kit 0    Blood Glucose Monitoring Suppl DEVI 1 each by Does not apply route in the morning, at noon, and at bedtime. May substitute to any manufacturer covered by patient's insurance. 1 each 0    Continuous Glucose Receiver (FREESTYLE LIBRE 2 READER) DEVI USE AS DIRECTED 1 each 0    Continuous Glucose Sensor (FREESTYLE LIBRE 2 SENSOR) MISC Use to check blood sugar TID. Change sensor once every 14 days. E11.65 2 each 11    glucose blood (ACCU-CHEK GUIDE) test strip Use as instructed 100 each 12    glucose blood (FREESTYLE LITE) test  strip Use as instructed.  Please make sure this is compatible with [facility] 2 reader. 100 each 12    Insulin Pen Needle (B-D ULTRAFINE III SHORT PEN) 31G X 8 MM MISC USE AS DIRECTED 100 each 6    Lancet Devices (ACCU-CHEK SOFTCLIX) lancets 1 each by Other route 3 (three) times daily. 1 each 0    Scheduled:   amiodarone  200 mg Oral Daily   aspirin  81 mg Oral Daily   atorvastatin  10 mg Oral Daily   cholecalciferol  1,000 Units Oral Daily   gabapentin  300 mg Oral QHS   insulin aspart  0-15 Units Subcutaneous TID WC   insulin aspart  0-5 Units Subcutaneous QHS   insulin glargine-yfgn  42 Units Subcutaneous QHS   methocarbamol  500 mg Oral Daily   metoprolol succinate  50 mg Oral Daily   sacubitril-valsartan  1 tablet Oral BID   sertraline  50 mg Oral Daily   sodium chloride flush  3 mL Intravenous Q12H   spironolactone  12.5 mg Oral Daily   tamsulosin  0.8 mg Oral Daily   torsemide  80 mg Oral Daily   Infusions:   heparin 1,800 Units/hr (04/25/23 0404)   PRN: acetaminophen **OR** acetaminophen, albuterol, clonazePAM **AND** clonazePAM, famotidine, HYDROmorphone (DILAUDID) injection, ondansetron **OR** ondansetron (ZOFRAN) IV  Assessment: 57 yom with a history of T2DM, CKD, non-ischemic cardiomyopathy, HF, ICD, COPD, obesity. 10/24-29 admission for groin cellulitis/infection. Patient is presenting  with swelling in left hand extending to arm and chest area. Heparin per pharmacy consult placed for  Left UE DVT .  Patient is not on anticoagulation prior to arrival.  Heparin level at 0200 returned therapeutic at 0.7 on 1800 units/hr. Hgb 13.7, plt wnl. No issues with the infusion or signs of bleeding per RN.   Goal of Therapy:  Heparin level 0.3-0.7 units/ml Monitor platelets by anticoagulation protocol: Yes   Plan:  Continue heparin 1800 units/hr Confirmatory anti-Xa level in 6 hours and daily while on heparin Continue to monitor H&H and platelets  Thank you for involving pharmacy in the patient's care.   Theotis Burrow, PharmD PGY1 Acute Care Pharmacy Resident  04/25/2023 6:22 AM

## 2023-04-25 NOTE — Progress Notes (Signed)
Rounding Note    Patient Name: [name] Date of Encounter: 04/25/2023  [facility] Cardiologist: Olga Millers, MD   Subjective   [name] some mild left sided chest discomfort yesterday, none today. Left arm still very swollen and tender.  Inpatient Medications    Scheduled Meds:  amiodarone  200 mg Oral Daily   aspirin  81 mg Oral Daily   atorvastatin  10 mg Oral Daily   cholecalciferol  1,000 Units Oral Daily   gabapentin  300 mg Oral QHS   insulin aspart  0-15 Units Subcutaneous TID WC   insulin aspart  0-5 Units Subcutaneous QHS   insulin glargine-yfgn  42 Units Subcutaneous QHS   methocarbamol  500 mg Oral Daily   metoprolol succinate  50 mg Oral Daily   sacubitril-valsartan  1 tablet Oral BID   sertraline  50 mg Oral Daily   sodium chloride flush  3 mL Intravenous Q12H   spironolactone  12.5 mg Oral Daily   tamsulosin  0.8 mg Oral Daily   torsemide  80 mg Oral Daily   Continuous Infusions:  heparin 1,800 Units/hr (04/25/23 0711)   PRN Meds: acetaminophen **OR** acetaminophen, albuterol, clonazePAM **AND** clonazePAM, famotidine, HYDROmorphone (DILAUDID) injection, ondansetron **OR** ondansetron (ZOFRAN) IV   Vital Signs    Vitals:   04/24/23 1728 04/24/23 2000 04/25/23 0300 04/25/23 0737  BP:   110/60 105/70  Pulse:   65 64  Resp:  (!) 21 18 19   Temp:  97.7 F (36.5 C) 97.7 F (36.5 C) 97.6 F (36.4 C)  TempSrc:  Oral Oral Oral  SpO2:   90% 92%  Weight: 128.8 kg  131.9 kg   Height: 6' (1.829 m)       Intake/Output Summary (Last 24 hours) at 04/25/2023 1129 Last data filed at 04/25/2023 0500 Gross per 24 hour  Intake 229.39 ml  Output 1350 ml  Net -1120.61 ml      04/25/2023    3:00 AM 04/24/2023    5:28 PM 04/24/2023   11:25 AM  Last 3 Weights  Weight (lbs) 290 lb 11.2 oz 284 lb 294 lb  Weight (kg) 131.861 kg 128.822 kg 133.358 kg      Telemetry    SR with PVCs - Personally Reviewed  Physical Exam   GEN: No acute  distress.   Neck: No JVD Cardiac: RRR, no murmurs, rubs, or gallops.  Respiratory: Clear to auscultation bilaterally. GI: Soft, nontender, non-distended  MS: LUE diffusely swollen and mildly tender. Palpable pulses Neuro:  Nonfocal  Psych: Normal affect   New pertinent results (labs, ECG, imaging, cardiac studies)     Patient Profile     57 y.o. male with a hx of NICM s/p ICD (12/23/2013, addition of LA lead in 05/2022), VT, DM II, CKD stage IIIb-IV followed by Dr. Vallery Sa, COPD and history of normal coronary arteries on cath 2015 who is being seen for evaluation of left arm DVT at the request of Dr. Rush Landmark.   Assessment & Plan    LUE DVT -echo recommended but not ordered yesterday--I ordered limited echo today to evaluate device wires for thrombus as well as exclude LV thrombus -on heparin now, planned to transition to DOAC. No significant CAD based on cath 2023, can stop aspirin when DOAC started -[place] does not show that DVT crosses the midline or involves the SVC  NICM s/p ICD History of VT CKD stage 3b-4 -stable -continue amiodarone, metoprolol -continue entresto -continue spironolactone -stop SLGT2i  as below  Type II diabetes -SGLT2i stopped given history of fungal infection and gluteal abscess -on aspirin, statin. Given that he does not have significant CAD, can stop aspirin once DOAC started and then restart aspirin when DOAC duration complete  COPD -per primary team    Signed, Jodelle Red, MD  04/25/2023, 11:29 AM

## 2023-04-25 NOTE — Progress Notes (Signed)
  Progress Note   Patient: [name] YTK:[id number] DOB: 12-12-1965 DOA: 04/24/2023     1 DOS: the patient was seen and examined on 04/25/2023   Brief hospital course: 57 y.o. male with medical history significant of multiple comorbidities including CAD, hypertension, nonischemic cardiomyopathy, obesity, hypertension, type 2 diabetes mellitus, CKD stage IV presented to ED with complaint of left upper extremity swelling and pain that started 2 days ago.  It has been getting worse so he came to the ED.  He is also complaining of the left anterior chest pain.  He further clarifies that due to history of CAD, he always has intermittent chest pain but for past 2 days, it has been more frequent and more intense and he characterizes it as pressure-like, 6-8 out of 10.  He also has chronic shortness of breath but more so lately.  It gets worse with exertion and laying flat.  Patient denies any history of fever, chills, sweating, palpitation, any problem with urination or with bowel movement.  No recent travel.  No previous history of blood clots   Assessment and Plan: Left upper extremity extensive acute DVT:  -Cardiology following -Pt is continued on heparin gtt -2d echo was ordered to evaluate device wires for thrombus -Ultimately would stop ASA when DOAC started per Cardiology   Chest pain: Vague chest pain.   -Likely secondary to extensive left upper extremity DVT.    Chronic systolic CHF/NICM/ICD in place/history of V. tach:  -currently with ICD in place -Cardiology following -echo was ordered to r/o device involvement by clot   Type 2 diabetes mellitus:  -cont with long-acting insulin, 42 units and placed on SSI.   -A1c of 9.3   Hyperlipidemia:  -Cont statin.   CKD stage IV:  -GFR around 28 which is at his baseline with creatinine around 2.5-2.6.  Monitor closely. -recheck bmet in AM   BPH: Cont with Flomax.   Class III obesity:  -Recommend diet/lifestyle modification   COPD:   -No audible wheezing -Cont albuterol as needed   Subjective: Still with L arm swelling and discomfort  Physical Exam: Vitals:   04/24/23 2000 04/25/23 0300 04/25/23 0737 04/25/23 1615  BP:  110/60 105/70 (!) 101/59  Pulse:  65 64 70  Resp: (!) 21 18 19 16   Temp: 97.7 F (36.5 C) 97.7 F (36.5 C) 97.6 F (36.4 C)   TempSrc: Oral Oral Oral   SpO2:  90% 92% 98%  Weight:  131.9 kg    Height:       General exam: Awake, laying in bed, in nad Respiratory system: Normal respiratory effort, no wheezing Cardiovascular system: regular rate, s1, s2 Gastrointestinal system: Soft, nondistended, positive BS Central nervous system: CN2-12 grossly intact, strength intact Extremities: Perfused, no clubbing, LUE swollen and tender Skin: Normal skin turgor, no notable skin lesions seen Psychiatry: Mood normal // no visual hallucinations   Data Reviewed:  Labs reviewed: Na 139, K 3.7, Cr 2.50, WBC 5.7, Hgb  13.7  Family Communication: Pt in room, family at bedside  Disposition: Status is: Inpatient Remains inpatient appropriate because: Severity of illness  Planned Discharge Destination: Home     Author: Rickey Barbara, MD 04/25/2023 5:07 PM  For on call review [email or web address].

## 2023-04-25 NOTE — Progress Notes (Signed)
ANTICOAGULATION CONSULT NOTE   Pharmacy Consult for Heparin Indication: Left UE DVT  Allergies  Allergen Reactions   Bee Venom Anaphylaxis   Trulicity [Dulaglutide] Anaphylaxis and Diarrhea    Extra dose of Trulicity caused cardiac arrest. Patient experience onset of diarrhea with using this medication - cleared after med stopped.    Patient Measurements: Height: 6' (182.9 cm) Weight: 131.9 kg (290 lb 11.2 oz) IBW/kg (Calculated) : 77.6 Heparin Dosing Weight: 107.9 kg  Vital Signs: Temp: 97.6 F (36.4 C) (11/16 0737) Temp Source: Oral (11/16 0737) BP: 105/70 (11/16 0737) Pulse Rate: 64 (11/16 0737)  Labs: Recent Labs    04/24/23 1257 04/24/23 1515 04/25/23 0157 04/25/23 1401  HGB 14.9  --  13.7  --   HCT 45.1  --  42.1  --   PLT 178  --  167  --   HEPARINUNFRC  --   --  0.70 0.75*  CREATININE 2.58*  --  2.50*  --   CKTOTAL  --  210  --   --   TROPONINIHS 11 8  --   --     Estimated Creatinine Clearance: 45.2 mL/min (A) (by C-G formula based on SCr of 2.5 mg/dL (H)).   Medical History: Past Medical History:  Diagnosis Date   AICD (automatic cardioverter/defibrillator) present    Chronic bronchitis (HCC)    "get it most q yr" (12/23/2013)   Chronic systolic (congestive) heart failure (HCC)    Fracture of left humerus    a. 07/2013.   GERD (gastroesophageal reflux disease)    not at present time   Heart murmur    "born w/it"    History of renal calculi    Hypertension    Kidney stones    NICM (nonischemic cardiomyopathy) (HCC)    a. 07/2013 Echo: EF 20-25%, diff HK, Gr2 DD, mild MR, mod dil LA/RA. 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   04/24/2023   albuterol (VENTOLIN HFA) 108 (90 Base) MCG/ACT inhaler Inhale 2 puffs into the lungs every 6 (six) hours as needed for wheezing or shortness of breath. 18 g 2 Unk   amiodarone (PACERONE) 200 MG tablet Take 1 tablet (200 mg total) by mouth daily. 90 tablet 3 04/24/2023   aspirin 81 MG tablet Take 81 mg by mouth daily.   04/24/2023   atorvastatin (LIPITOR) 10 MG tablet Take 1 tablet (10 mg total) by mouth daily. 90 tablet 1 04/24/2023   cetirizine (EQ ALLERGY RELIEF, CETIRIZINE,) 10 MG tablet Take 1 tablet (10 mg total) by mouth daily. (Patient taking differently: Take 10 mg by mouth in the morning.) 90 tablet 1 04/24/2023   cholecalciferol (VITAMIN D3) 25 MCG (1000 UNIT) tablet Take 1,000 Units by mouth in the morning.   04/24/2023   clonazePAM (KLONOPIN) 0.5 MG tablet Take 0.5 tablets (0.25 mg total) by mouth daily AND 1 tablet (0.5 mg total) at bedtime. (Patient taking differently: Take 0.5 tablets (0.25 mg total) by mouth in the morning and 1 tablet (0.5 mg total) at bedtime for a total daily dose of 0.75mg . )  45 tablet 1 04/24/2023   dapagliflozin propanediol (FARXIGA) 10 MG TABS tablet Take 1 tablet (10 mg total) by mouth daily before breakfast. 90 tablet 3 04/24/2023   doxycycline (VIBRA-TABS) 100 MG tablet Take 1 tablet (100 mg total) by mouth every 12 (twelve) hours. 10 tablet 0 04/24/2023   EPINEPHrine 0.3 mg/0.3 mL IJ SOAJ injection Inject 0.3 mg into the muscle as needed. 1 each 1 Unk   famotidine (PEPCID) 20 MG tablet TAKE 1 TABLET BY MOUTH 1 TO 2 TIMES DAILY AS NEEDED (Patient taking differently: Take 20 mg by mouth See admin instructions. TAKE 1 TABLET BY MOUTH 1 TO 2 TIMES DAILY AS NEEDED) 180 tablet 0 04/24/2023   fluticasone (FLONASE) 50 MCG/ACT nasal spray Use 1-2 sprays in each nostril once a day as needed for nasal congestion. 16 g 5 Unk   Fluticasone-Umeclidin-Vilant (TRELEGY ELLIPTA IN) Inhale 1 puff  into the lungs daily.   04/24/2023   Fluticasone-Umeclidin-Vilant (TRELEGY ELLIPTA) 100-62.5-25 MCG/ACT AEPB Inhale 1 puff into the lungs daily. 60 each 1 04/24/2023   gabapentin (NEURONTIN) 300 MG capsule Take 1 capsule by mouth at bedtime 90 capsule 1 04/23/2023   insulin aspart (NOVOLOG) 100 UNIT/ML injection Inject 24 Units into the skin 3 (three) times daily with meals.   04/24/2023   insulin glargine (LANTUS SOLOSTAR) 100 UNIT/ML Solostar Pen Inject 42 Units into the skin daily.   04/24/2023   loperamide (IMODIUM) 2 MG capsule Take 1 capsule (2 mg total) by mouth as needed for diarrhea or loose stools. 12 capsule 0 Unk   methocarbamol (ROBAXIN) 500 MG tablet Take 1 tablet (500 mg total) by mouth daily. (Patient taking differently: Take 500 mg by mouth at bedtime.) 90 tablet 1 04/23/2023   metoprolol succinate (TOPROL-XL) 50 MG 24 hr tablet Take 1 tablet (50 mg total) by mouth daily. (Patient taking differently: Take 50 mg by mouth in the morning.) 90 tablet 3 04/24/2023   nystatin (MYCOSTATIN/NYSTOP) powder Apply topically 3 (three) times daily. 15 g 0 04/24/2023   sacubitril-valsartan (ENTRESTO) 24-26 MG Take 1 tablet by mouth 2 (two) times daily. 60 tablet 5 04/24/2023   sertraline (ZOLOFT) 50 MG tablet Take 1 tablet (50 mg total) by mouth daily. (Patient taking differently: Take 50 mg by mouth in the morning.) 90 tablet 0 04/24/2023   spironolactone (ALDACTONE) 25 MG tablet Take 0.5 tablets (12.5 mg total) by mouth daily. (Patient taking differently: Take 12.5 mg by mouth in the morning.) 30 tablet 5 04/24/2023   tamsulosin (FLOMAX) 0.4 MG CAPS capsule Take 2 capsules (0.8 mg total) by mouth daily. (Patient taking differently: Take 0.8 mg by mouth in the morning.) 180 capsule 1 04/24/2023   torsemide (DEMADEX) 20 MG tablet Take 4 tablets (80 mg total) by mouth daily. (Patient taking differently: Take 80 mg by mouth in the morning.) 120 tablet 3 04/24/2023   Accu-Chek Softclix Lancets lancets  Use as instructed 100 each 12    Blood Glucose Monitoring Suppl (ACCU-CHEK GUIDE) w/Device KIT UAD 1 kit 0    Blood Glucose Monitoring Suppl DEVI 1 each by Does not apply route in the morning, at noon, and at bedtime. May substitute to any manufacturer covered by patient's insurance. 1 each 0    Continuous Glucose Receiver (FREESTYLE LIBRE 2 READER) DEVI USE AS DIRECTED 1 each 0    Continuous Glucose Sensor (FREESTYLE LIBRE 2 SENSOR) MISC Use to check blood sugar TID. Change sensor once every 14 days. E11.65 2 each 11  glucose blood (ACCU-CHEK GUIDE) test strip Use as instructed 100 each 12    glucose blood (FREESTYLE LITE) test strip Use as instructed.  Please make sure this is compatible with [facility] 2 reader. 100 each 12    Insulin Pen Needle (B-D ULTRAFINE III SHORT PEN) 31G X 8 MM MISC USE AS DIRECTED 100 each 6    Lancet Devices (ACCU-CHEK SOFTCLIX) lancets 1 each by Other route 3 (three) times daily. 1 each 0    Scheduled:   amiodarone  200 mg Oral Daily   aspirin  81 mg Oral Daily   atorvastatin  10 mg Oral Daily   cholecalciferol  1,000 Units Oral Daily   gabapentin  300 mg Oral QHS   insulin aspart  0-15 Units Subcutaneous TID WC   insulin aspart  0-5 Units Subcutaneous QHS   insulin glargine-yfgn  42 Units Subcutaneous QHS   methocarbamol  500 mg Oral Daily   metoprolol succinate  50 mg Oral Daily   sacubitril-valsartan  1 tablet Oral BID   sertraline  50 mg Oral Daily   sodium chloride flush  3 mL Intravenous Q12H   spironolactone  12.5 mg Oral Daily   tamsulosin  0.8 mg Oral Daily   torsemide  80 mg Oral Daily   Infusions:   heparin 1,800 Units/hr (04/25/23 0711)   PRN: acetaminophen **OR** acetaminophen, albuterol, clonazePAM **AND** clonazePAM, famotidine, HYDROmorphone (DILAUDID) injection, ondansetron **OR** ondansetron (ZOFRAN) IV  Assessment: 57 yom with a history of T2DM, CKD, non-ischemic cardiomyopathy, HF, ICD, COPD, obesity. 10/24-29 admission for groin  cellulitis/infection. Patient is presenting with swelling in left hand extending to arm and chest area. Heparin per pharmacy consult placed for  Left UE DVT .  Patient is not on anticoagulation prior to arrival.  Heparin level supratherapeutic at 0.75 on 1800 units/hr. Hgb 13.7, plt wnl. No issues with the infusion or signs of bleeding per RN.   Goal of Therapy:  Heparin level 0.3-0.7 units/ml Monitor platelets by anticoagulation protocol: Yes   Plan:  Decrease heparin to 1700 units/hr  Anti-Xa level in 6 hours and daily while on heparin Continue to monitor H&H and platelets  Thank you for involving pharmacy in the patient's care.   Theotis Burrow, PharmD PGY1 Acute Care Pharmacy Resident  04/25/2023 2:36 PM

## 2023-04-25 NOTE — Hospital Course (Signed)
57 y.o. male with medical history significant of multiple comorbidities including CAD, hypertension, nonischemic cardiomyopathy, obesity, hypertension, type 2 diabetes mellitus, CKD stage IV presented to ED with complaint of left upper extremity swelling and pain that started 2 days ago.  It has been getting worse so he came to the ED.  He is also complaining of the left anterior chest pain.  He further clarifies that due to history of CAD, he always has intermittent chest pain but for past 2 days, it has been more frequent and more intense and he characterizes it as pressure-like, 6-8 out of 10.  He also has chronic shortness of breath but more so lately.  It gets worse with exertion and laying flat.  Patient denies any history of fever, chills, sweating, palpitation, any problem with urination or with bowel movement.  No recent travel.  No previous history of blood clots

## 2023-04-26 ENCOUNTER — Inpatient Hospital Stay (HOSPITAL_COMMUNITY): Payer: Medicaid Other

## 2023-04-26 DIAGNOSIS — I82622 Acute embolism and thrombosis of deep veins of left upper extremity: Secondary | ICD-10-CM | POA: Diagnosis not present

## 2023-04-26 DIAGNOSIS — Z95810 Presence of automatic (implantable) cardiac defibrillator: Secondary | ICD-10-CM | POA: Diagnosis not present

## 2023-04-26 DIAGNOSIS — I5022 Chronic systolic (congestive) heart failure: Secondary | ICD-10-CM | POA: Diagnosis not present

## 2023-04-26 DIAGNOSIS — I428 Other cardiomyopathies: Secondary | ICD-10-CM | POA: Diagnosis not present

## 2023-04-26 DIAGNOSIS — I82A12 Acute embolism and thrombosis of left axillary vein: Secondary | ICD-10-CM | POA: Diagnosis not present

## 2023-04-26 LAB — CBC
HCT: 42.5 % (ref 39.0–52.0)
Hemoglobin: 13.5 g/dL (ref 13.0–17.0)
MCH: 30.1 pg (ref 26.0–34.0)
MCHC: 31.8 g/dL (ref 30.0–36.0)
MCV: 94.9 fL (ref 80.0–100.0)
Platelets: 171 10*3/uL (ref 150–400)
RBC: 4.48 MIL/uL (ref 4.22–5.81)
RDW: 14.3 % (ref 11.5–15.5)
WBC: 5.8 10*3/uL (ref 4.0–10.5)
nRBC: 0 % (ref 0.0–0.2)

## 2023-04-26 LAB — COMPREHENSIVE METABOLIC PANEL
ALT: 21 U/L (ref 0–44)
AST: 13 U/L — ABNORMAL LOW (ref 15–41)
Albumin: 3.4 g/dL — ABNORMAL LOW (ref 3.5–5.0)
Alkaline Phosphatase: 51 U/L (ref 38–126)
Anion gap: 10 (ref 5–15)
BUN: 29 mg/dL — ABNORMAL HIGH (ref 6–20)
CO2: 26 mmol/L (ref 22–32)
Calcium: 9.1 mg/dL (ref 8.9–10.3)
Chloride: 104 mmol/L (ref 98–111)
Creatinine, Ser: 2.45 mg/dL — ABNORMAL HIGH (ref 0.61–1.24)
GFR, Estimated: 30 mL/min — ABNORMAL LOW (ref >60)
Glucose, Bld: 147 mg/dL — ABNORMAL HIGH (ref 70–99)
Potassium: 3.6 mmol/L (ref 3.5–5.1)
Sodium: 140 mmol/L (ref 135–145)
Total Bilirubin: 0.7 mg/dL (ref <1.2)
Total Protein: 6.6 g/dL (ref 6.5–8.1)

## 2023-04-26 LAB — ECHOCARDIOGRAM LIMITED
Height: 72 in
Weight: 4649.6 [oz_av]

## 2023-04-26 LAB — HEPARIN LEVEL (UNFRACTIONATED)
Heparin Unfractionated: 0.47 [IU]/mL (ref 0.30–0.70)
Heparin Unfractionated: 0.63 [IU]/mL (ref 0.30–0.70)

## 2023-04-26 LAB — GLUCOSE, CAPILLARY
Glucose-Capillary: 171 mg/dL — ABNORMAL HIGH (ref 70–99)
Glucose-Capillary: 160 mg/dL — ABNORMAL HIGH (ref 70–99)
Glucose-Capillary: 179 mg/dL — ABNORMAL HIGH (ref 70–99)

## 2023-04-26 MED ORDER — PERFLUTREN LIPID MICROSPHERE
1.0000 mL | INTRAVENOUS | Status: AC | PRN
  Administered 2023-04-26: 4 mL via INTRAVENOUS

## 2023-04-26 MED ORDER — CLOTRIMAZOLE 1 % EX CREA
TOPICAL_CREAM | Freq: Two times a day (BID) | CUTANEOUS | Status: DC
  Filled 2023-04-26: qty 15

## 2023-04-26 MED ORDER — APIXABAN 5 MG PO TABS: ORAL_TABLET | ORAL | 0 refills | Status: DC

## 2023-04-26 MED ORDER — APIXABAN 5 MG PO TABS: 5.0000 mg | ORAL_TABLET | Freq: Two times a day (BID) | ORAL | Status: DC

## 2023-04-26 MED ORDER — APIXABAN 5 MG PO TABS
10.0000 mg | ORAL_TABLET | Freq: Two times a day (BID) | ORAL | Status: DC
  Administered 2023-04-26: 10 mg via ORAL
  Filled 2023-04-26: qty 2

## 2023-04-26 NOTE — Progress Notes (Signed)
ANTICOAGULATION CONSULT NOTE   Pharmacy Consult for Heparin Indication: Left UE DVT  Allergies  Allergen Reactions   Bee Venom Anaphylaxis   Trulicity [Dulaglutide] Anaphylaxis and Diarrhea    Extra dose of Trulicity caused cardiac arrest. Patient experience onset of diarrhea with using this medication - cleared after med stopped.    Patient Measurements: Height: 6' (182.9 cm) Weight: 131.8 kg (290 lb 9.6 oz) IBW/kg (Calculated) : 77.6 Heparin Dosing Weight: 107.9 kg  Vital Signs: Temp: 97.8 F (36.6 C) (11/17 0557) Temp Source: Oral (11/17 0557) BP: 101/65 (11/17 0557) Pulse Rate: 65 (11/16 2139)  Labs: Recent Labs    04/24/23 1257 04/24/23 1257 04/24/23 1515 04/25/23 0157 04/25/23 1401 04/25/23 2330 04/26/23 0554  HGB 14.9  --   --  13.7  --   --  13.5  HCT 45.1  --   --  42.1  --   --  42.5  PLT 178  --   --  167  --   --  171  HEPARINUNFRC  --    < >  --  0.70 0.75* 0.47 0.63  CREATININE 2.58*  --   --  2.50*  --   --  2.45*  CKTOTAL  --   --  210  --   --   --   --   TROPONINIHS 11  --  8  --   --   --   --    < > = values in this interval not displayed.    Estimated Creatinine Clearance: 46.2 mL/min (A) (by C-G formula based on SCr of 2.45 mg/dL (H)).   Medical History: Past Medical History:  Diagnosis Date   AICD (automatic cardioverter/defibrillator) present    Chronic bronchitis (HCC)    "get it most q yr" (12/23/2013)   Chronic systolic (congestive) heart failure (HCC)    Fracture of left humerus    a. 07/2013.   GERD (gastroesophageal reflux disease)    not at present time   Heart murmur    "born w/it"    History of renal calculi    Hypertension    Kidney stones    NICM (nonischemic cardiomyopathy) (HCC)    a. 07/2013 Echo: EF 20-25%, diff HK, Gr2 DD, mild MR, mod dil LA/RA. 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   04/24/2023   albuterol (VENTOLIN HFA) 108 (90 Base) MCG/ACT inhaler Inhale 2 puffs into the lungs every 6 (six) hours as needed for wheezing or shortness of breath. 18 g 2 Unk   amiodarone (PACERONE) 200 MG tablet Take 1 tablet (200 mg total) by mouth daily. 90 tablet 3 04/24/2023   aspirin 81 MG tablet Take 81 mg by mouth daily.   04/24/2023   atorvastatin (LIPITOR) 10 MG tablet Take 1 tablet (10 mg total) by mouth daily. 90 tablet 1 04/24/2023   cetirizine (EQ ALLERGY RELIEF, CETIRIZINE,) 10 MG tablet Take 1 tablet (10 mg total) by mouth daily. (Patient taking differently: Take 10 mg by mouth in the morning.) 90 tablet 1 04/24/2023   cholecalciferol (VITAMIN D3)  25 MCG (1000 UNIT) tablet Take 1,000 Units by mouth in the morning.   04/24/2023   clonazePAM (KLONOPIN) 0.5 MG tablet Take 0.5 tablets (0.25 mg total) by mouth daily AND 1 tablet (0.5 mg total) at bedtime. (Patient taking differently: Take 0.5 tablets (0.25 mg total) by mouth in the morning and 1 tablet (0.5 mg total) at bedtime for a total daily dose of 0.75mg . ) 45 tablet 1 04/24/2023   dapagliflozin propanediol (FARXIGA) 10 MG TABS tablet Take 1 tablet (10 mg total) by mouth daily before breakfast. 90 tablet 3 04/24/2023   doxycycline (VIBRA-TABS) 100 MG tablet Take 1 tablet (100 mg total) by mouth every 12 (twelve) hours. 10 tablet 0 04/24/2023   EPINEPHrine 0.3 mg/0.3 mL IJ SOAJ injection Inject 0.3 mg into the muscle as needed. 1 each 1 Unk   famotidine (PEPCID) 20 MG tablet TAKE 1 TABLET BY MOUTH 1 TO 2 TIMES DAILY AS NEEDED (Patient taking differently: Take 20 mg by mouth See admin instructions. TAKE 1 TABLET BY MOUTH 1 TO 2 TIMES DAILY AS NEEDED) 180 tablet 0 04/24/2023    fluticasone (FLONASE) 50 MCG/ACT nasal spray Use 1-2 sprays in each nostril once a day as needed for nasal congestion. 16 g 5 Unk   Fluticasone-Umeclidin-Vilant (TRELEGY ELLIPTA IN) Inhale 1 puff into the lungs daily.   04/24/2023   Fluticasone-Umeclidin-Vilant (TRELEGY ELLIPTA) 100-62.5-25 MCG/ACT AEPB Inhale 1 puff into the lungs daily. 60 each 1 04/24/2023   gabapentin (NEURONTIN) 300 MG capsule Take 1 capsule by mouth at bedtime 90 capsule 1 04/23/2023   insulin aspart (NOVOLOG) 100 UNIT/ML injection Inject 24 Units into the skin 3 (three) times daily with meals.   04/24/2023   insulin glargine (LANTUS SOLOSTAR) 100 UNIT/ML Solostar Pen Inject 42 Units into the skin daily.   04/24/2023   loperamide (IMODIUM) 2 MG capsule Take 1 capsule (2 mg total) by mouth as needed for diarrhea or loose stools. 12 capsule 0 Unk   methocarbamol (ROBAXIN) 500 MG tablet Take 1 tablet (500 mg total) by mouth daily. (Patient taking differently: Take 500 mg by mouth at bedtime.) 90 tablet 1 04/23/2023   metoprolol succinate (TOPROL-XL) 50 MG 24 hr tablet Take 1 tablet (50 mg total) by mouth daily. (Patient taking differently: Take 50 mg by mouth in the morning.) 90 tablet 3 04/24/2023   nystatin (MYCOSTATIN/NYSTOP) powder Apply topically 3 (three) times daily. 15 g 0 04/24/2023   sacubitril-valsartan (ENTRESTO) 24-26 MG Take 1 tablet by mouth 2 (two) times daily. 60 tablet 5 04/24/2023   sertraline (ZOLOFT) 50 MG tablet Take 1 tablet (50 mg total) by mouth daily. (Patient taking differently: Take 50 mg by mouth in the morning.) 90 tablet 0 04/24/2023   spironolactone (ALDACTONE) 25 MG tablet Take 0.5 tablets (12.5 mg total) by mouth daily. (Patient taking differently: Take 12.5 mg by mouth in the morning.) 30 tablet 5 04/24/2023   tamsulosin (FLOMAX) 0.4 MG CAPS capsule Take 2 capsules (0.8 mg total) by mouth daily. (Patient taking differently: Take 0.8 mg by mouth in the morning.) 180 capsule 1 04/24/2023    torsemide (DEMADEX) 20 MG tablet Take 4 tablets (80 mg total) by mouth daily. (Patient taking differently: Take 80 mg by mouth in the morning.) 120 tablet 3 04/24/2023   Accu-Chek Softclix Lancets lancets Use as instructed 100 each 12    Blood Glucose Monitoring Suppl (ACCU-CHEK GUIDE) w/Device KIT UAD 1 kit 0    Blood Glucose Monitoring Suppl DEVI 1 each by Does  not apply route in the morning, at noon, and at bedtime. May substitute to any manufacturer covered by patient's insurance. 1 each 0    Continuous Glucose Receiver (FREESTYLE LIBRE 2 READER) DEVI USE AS DIRECTED 1 each 0    Continuous Glucose Sensor (FREESTYLE LIBRE 2 SENSOR) MISC Use to check blood sugar TID. Change sensor once every 14 days. E11.65 2 each 11    glucose blood (ACCU-CHEK GUIDE) test strip Use as instructed 100 each 12    glucose blood (FREESTYLE LITE) test strip Use as instructed.  Please make sure this is compatible with [facility] 2 reader. 100 each 12    Insulin Pen Needle (B-D ULTRAFINE III SHORT PEN) 31G X 8 MM MISC USE AS DIRECTED 100 each 6    Lancet Devices (ACCU-CHEK SOFTCLIX) lancets 1 each by Other route 3 (three) times daily. 1 each 0    Scheduled:   amiodarone  200 mg Oral Daily   aspirin  81 mg Oral Daily   atorvastatin  10 mg Oral Daily   cholecalciferol  1,000 Units Oral Daily   gabapentin  300 mg Oral QHS   insulin aspart  0-15 Units Subcutaneous TID WC   insulin aspart  0-5 Units Subcutaneous QHS   insulin glargine-yfgn  42 Units Subcutaneous QHS   methocarbamol  500 mg Oral Daily   metoprolol succinate  50 mg Oral Daily   nystatin   Topical BID   sacubitril-valsartan  1 tablet Oral BID   sertraline  50 mg Oral Daily   sodium chloride flush  3 mL Intravenous Q12H   spironolactone  12.5 mg Oral Daily   tamsulosin  0.8 mg Oral Daily   torsemide  80 mg Oral Daily   Infusions:   heparin 1,700 Units/hr (04/25/23 1917)   PRN: acetaminophen **OR** acetaminophen, albuterol, clonazePAM **AND**  clonazePAM, famotidine, HYDROmorphone (DILAUDID) injection, ondansetron **OR** ondansetron (ZOFRAN) IV  Assessment: 57 yom with a history of T2DM, CKD, non-ischemic cardiomyopathy, HF, ICD, COPD, obesity. 10/24-29 admission for groin cellulitis/infection. Patient is presenting with swelling in left hand extending to arm and chest area. Heparin per pharmacy consult placed for  Left UE DVT .  Patient is not on anticoagulation prior to arrival.  Heparin level therapeutic at 0.63 on 1700 units/hr. Hgb 13.5, plt wnl. No issues with infusion or signs of bleeding noted.   Goal of Therapy:  Heparin level 0.3-0.7 units/ml Monitor platelets by anticoagulation protocol: Yes   Plan:  Continue heparin 1700 units/hr Daily heparin level and CBC Monitor for signs/sx of bleeding F/u plans to transition to oral DOAC  Thank you for involving pharmacy in the patient's care.   Theotis Burrow, PharmD PGY1 Acute Care Pharmacy Resident  04/26/2023 6:47 AM

## 2023-04-26 NOTE — Progress Notes (Signed)
Echocardiogram 2D Echocardiogram has been performed.  Warren Lacy Elayah Klooster RDCS 04/26/2023, 2:04 PM

## 2023-04-26 NOTE — Progress Notes (Signed)
ANTICOAGULATION CONSULT NOTE   Pharmacy Consult for Heparin >> Apixaban Indication: Left UE DVT  Allergies  Allergen Reactions   Bee Venom Anaphylaxis   Trulicity [Dulaglutide] Anaphylaxis and Diarrhea    Extra dose of Trulicity caused cardiac arrest. Patient experience onset of diarrhea with using this medication - cleared after med stopped.    Patient Measurements: Height: 6' (182.9 cm) Weight: 131.8 kg (290 lb 9.6 oz) IBW/kg (Calculated) : 77.6 Heparin Dosing Weight: 107.9 kg  Vital Signs: Temp: 97.7 F (36.5 C) (11/17 1439) Temp Source: Oral (11/17 1439) BP: 90/57 (11/17 1439) Pulse Rate: 67 (11/17 1439)  Labs: Recent Labs    04/24/23 1257 04/24/23 1257 04/24/23 1515 04/25/23 0157 04/25/23 1401 04/25/23 2330 04/26/23 0554  HGB 14.9  --   --  13.7  --   --  13.5  HCT 45.1  --   --  42.1  --   --  42.5  PLT 178  --   --  167  --   --  171  HEPARINUNFRC  --    < >  --  0.70 0.75* 0.47 0.63  CREATININE 2.58*  --   --  2.50*  --   --  2.45*  CKTOTAL  --   --  210  --   --   --   --   TROPONINIHS 11  --  8  --   --   --   --    < > = values in this interval not displayed.    Estimated Creatinine Clearance: 46.2 mL/min (A) (by C-G formula based on SCr of 2.45 mg/dL (H)).   Medical History: Past Medical History:  Diagnosis Date   AICD (automatic cardioverter/defibrillator) present    Chronic bronchitis (HCC)    "get it most q yr" (12/23/2013)   Chronic systolic (congestive) heart failure (HCC)    Fracture of left humerus    a. 07/2013.   GERD (gastroesophageal reflux disease)    not at present time   Heart murmur    "born w/it"    History of renal calculi    Hypertension    Kidney stones    NICM (nonischemic cardiomyopathy) (HCC)    a. 07/2013 Echo: EF 20-25%, diff HK, Gr2 DD, mild MR, mod dil LA/RA. 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   04/24/2023   albuterol (VENTOLIN HFA) 108 (90 Base) MCG/ACT inhaler Inhale 2 puffs into the lungs every 6 (six) hours as needed for wheezing or shortness of breath. 18 g 2 Unk   amiodarone (PACERONE) 200 MG tablet Take 1 tablet (200 mg total) by mouth daily. 90 tablet 3 04/24/2023   aspirin 81 MG tablet Take 81 mg by mouth daily.   04/24/2023   atorvastatin (LIPITOR) 10 MG tablet Take 1 tablet (10 mg total) by mouth daily. 90 tablet 1 04/24/2023   cetirizine (EQ ALLERGY RELIEF, CETIRIZINE,) 10 MG tablet Take 1 tablet (10 mg total) by mouth daily. (Patient taking differently: Take 10 mg by mouth in the morning.) 90 tablet 1 04/24/2023   cholecalciferol (  VITAMIN D3) 25 MCG (1000 UNIT) tablet Take 1,000 Units by mouth in the morning.   04/24/2023   clonazePAM (KLONOPIN) 0.5 MG tablet Take 0.5 tablets (0.25 mg total) by mouth daily AND 1 tablet (0.5 mg total) at bedtime. (Patient taking differently: Take 0.5 tablets (0.25 mg total) by mouth in the morning and 1 tablet (0.5 mg total) at bedtime for a total daily dose of 0.75mg . ) 45 tablet 1 04/24/2023   dapagliflozin propanediol (FARXIGA) 10 MG TABS tablet Take 1 tablet (10 mg total) by mouth daily before breakfast. 90 tablet 3 04/24/2023   doxycycline (VIBRA-TABS) 100 MG tablet Take 1 tablet (100 mg total) by mouth every 12 (twelve) hours. 10 tablet 0 04/24/2023   EPINEPHrine 0.3 mg/0.3 mL IJ SOAJ injection Inject 0.3 mg into the muscle as needed. 1 each 1 Unk   famotidine (PEPCID) 20 MG tablet TAKE 1 TABLET BY MOUTH 1 TO 2 TIMES DAILY AS NEEDED (Patient taking differently: Take 20 mg by mouth See admin instructions. TAKE 1 TABLET BY MOUTH 1 TO 2 TIMES DAILY AS NEEDED) 180 tablet 0  04/24/2023   fluticasone (FLONASE) 50 MCG/ACT nasal spray Use 1-2 sprays in each nostril once a day as needed for nasal congestion. 16 g 5 Unk   Fluticasone-Umeclidin-Vilant (TRELEGY ELLIPTA IN) Inhale 1 puff into the lungs daily.   04/24/2023   Fluticasone-Umeclidin-Vilant (TRELEGY ELLIPTA) 100-62.5-25 MCG/ACT AEPB Inhale 1 puff into the lungs daily. 60 each 1 04/24/2023   gabapentin (NEURONTIN) 300 MG capsule Take 1 capsule by mouth at bedtime 90 capsule 1 04/23/2023   insulin aspart (NOVOLOG) 100 UNIT/ML injection Inject 24 Units into the skin 3 (three) times daily with meals.   04/24/2023   insulin glargine (LANTUS SOLOSTAR) 100 UNIT/ML Solostar Pen Inject 42 Units into the skin daily.   04/24/2023   loperamide (IMODIUM) 2 MG capsule Take 1 capsule (2 mg total) by mouth as needed for diarrhea or loose stools. 12 capsule 0 Unk   methocarbamol (ROBAXIN) 500 MG tablet Take 1 tablet (500 mg total) by mouth daily. (Patient taking differently: Take 500 mg by mouth at bedtime.) 90 tablet 1 04/23/2023   metoprolol succinate (TOPROL-XL) 50 MG 24 hr tablet Take 1 tablet (50 mg total) by mouth daily. (Patient taking differently: Take 50 mg by mouth in the morning.) 90 tablet 3 04/24/2023   nystatin (MYCOSTATIN/NYSTOP) powder Apply topically 3 (three) times daily. 15 g 0 04/24/2023   sacubitril-valsartan (ENTRESTO) 24-26 MG Take 1 tablet by mouth 2 (two) times daily. 60 tablet 5 04/24/2023   sertraline (ZOLOFT) 50 MG tablet Take 1 tablet (50 mg total) by mouth daily. (Patient taking differently: Take 50 mg by mouth in the morning.) 90 tablet 0 04/24/2023   spironolactone (ALDACTONE) 25 MG tablet Take 0.5 tablets (12.5 mg total) by mouth daily. (Patient taking differently: Take 12.5 mg by mouth in the morning.) 30 tablet 5 04/24/2023   tamsulosin (FLOMAX) 0.4 MG CAPS capsule Take 2 capsules (0.8 mg total) by mouth daily. (Patient taking differently: Take 0.8 mg by mouth in the morning.) 180 capsule 1 04/24/2023    torsemide (DEMADEX) 20 MG tablet Take 4 tablets (80 mg total) by mouth daily. (Patient taking differently: Take 80 mg by mouth in the morning.) 120 tablet 3 04/24/2023   Accu-Chek Softclix Lancets lancets Use as instructed 100 each 12    Blood Glucose Monitoring Suppl (ACCU-CHEK GUIDE) w/Device KIT UAD 1 kit 0    Blood Glucose Monitoring Suppl DEVI 1 each  by Does not apply route in the morning, at noon, and at bedtime. May substitute to any manufacturer covered by patient's insurance. 1 each 0    Continuous Glucose Receiver (FREESTYLE LIBRE 2 READER) DEVI USE AS DIRECTED 1 each 0    Continuous Glucose Sensor (FREESTYLE LIBRE 2 SENSOR) MISC Use to check blood sugar TID. Change sensor once every 14 days. E11.65 2 each 11    glucose blood (ACCU-CHEK GUIDE) test strip Use as instructed 100 each 12    glucose blood (FREESTYLE LITE) test strip Use as instructed.  Please make sure this is compatible with [facility] 2 reader. 100 each 12    Insulin Pen Needle (B-D ULTRAFINE III SHORT PEN) 31G X 8 MM MISC USE AS DIRECTED 100 each 6    Lancet Devices (ACCU-CHEK SOFTCLIX) lancets 1 each by Other route 3 (three) times daily. 1 each 0    Scheduled:   amiodarone  200 mg Oral Daily   apixaban  10 mg Oral BID   Followed by   Melene Muller ON 05/03/2023] apixaban  5 mg Oral BID   atorvastatin  10 mg Oral Daily   cholecalciferol  1,000 Units Oral Daily   clotrimazole   Topical BID   gabapentin  300 mg Oral QHS   insulin aspart  0-15 Units Subcutaneous TID WC   insulin aspart  0-5 Units Subcutaneous QHS   insulin glargine-yfgn  42 Units Subcutaneous QHS   methocarbamol  500 mg Oral Daily   metoprolol succinate  50 mg Oral Daily   nystatin   Topical BID   sacubitril-valsartan  1 tablet Oral BID   sertraline  50 mg Oral Daily   sodium chloride flush  3 mL Intravenous Q12H   spironolactone  12.5 mg Oral Daily   tamsulosin  0.8 mg Oral Daily   torsemide  80 mg Oral Daily   Infusions:   heparin 1,700 Units/hr  (04/26/23 1140)   PRN: acetaminophen **OR** acetaminophen, albuterol, clonazePAM **AND** clonazePAM, famotidine, HYDROmorphone (DILAUDID) injection, ondansetron **OR** ondansetron (ZOFRAN) IV, perflutren lipid microspheres (DEFINITY) IV suspension  Assessment: 57 yom with a history of T2DM, CKD, non-ischemic cardiomyopathy, HF, ICD, COPD, obesity. 10/24-29 admission for groin cellulitis/infection. Patient is presenting with swelling in left hand extending to arm and chest area. Heparin per pharmacy consult placed for  Left UE DVT .    Patient is not on anticoagulation prior to arrival.  11/17 update: Pharmacy consulted to transition to Apixaban for VTE treatment.  Goal of Therapy:  Therapeutic Anticoagulation Monitor platelets by anticoagulation protocol: Yes   Plan:  Continue heparin 1700 units/hr until 6pm At 6pm, start Apixaban 10mg  PO BID x 7 days, then decrease dose to 5mg  BID thereafter. Monitor for signs/sx of bleeding Will follow-up on copay check in AM.  Thank you for involving pharmacy in the patient's care.   [employer], Pharm.D., BCPS Clinical Pharmacist  **Pharmacist phone directory can be found on [email or web address] listed under [facility].  04/26/2023 3:44 PM

## 2023-04-26 NOTE — Discharge Summary (Signed)
Physician Discharge Summary   Patient: [name] MRN: [id number] DOB: 02-Sep-1965  Admit date:     04/24/2023  Discharge date: 04/26/23  Discharge Physician: Rickey Barbara   PCP: Marcine Matar, MD   Recommendations at discharge:    Follow up with your PCP in 1-2 weeks Follow up with Cardiology as already scheduled on 11/27  Discharge Diagnoses: Principal Problem:   Acute deep vein thrombosis (DVT) of left upper extremity (HCC) Active Problems:   Chronic systolic congestive heart failure (HCC)   Asthma-COPD overlap syndrome (HCC)   Class 3 obesity   GAD (generalized anxiety disorder)   GERD   Chest pain   Diabetic polyneuropathy associated with type 2 diabetes mellitus (HCC)   BPH with obstruction/lower urinary tract symptoms   Essential hypertension   Hyperlipidemia associated with type 2 diabetes mellitus (HCC)  Resolved Problems:   * No resolved hospital problems. *  Hospital Course: 57 y.o. male with medical history significant of multiple comorbidities including CAD, hypertension, nonischemic cardiomyopathy, obesity, hypertension, type 2 diabetes mellitus, CKD stage IV presented to ED with complaint of left upper extremity swelling and pain that started 2 days ago.  It has been getting worse so he came to the ED.  He is also complaining of the left anterior chest pain.  He further clarifies that due to history of CAD, he always has intermittent chest pain but for past 2 days, it has been more frequent and more intense and he characterizes it as pressure-like, 6-8 out of 10.  He also has chronic shortness of breath but more so lately.  It gets worse with exertion and laying flat.  Patient denies any history of fever, chills, sweating, palpitation, any problem with urination or with bowel movement.  No recent travel.  No previous history of blood clots   Assessment and Plan: Left upper extremity extensive acute DVT:  -Cardiology following -Pt is continued on heparin  gtt -2d echo was performed and was negative for lead thrombus -Discussed with Cardiology. Ok to transition to eliquis on d/c. Stopped aspirin once eliquis was started   Chest pain: Vague chest pain.   -Likely secondary to extensive left upper extremity DVT.    Chronic systolic CHF/NICM/ICD in place/history of V. tach:  -currently with ICD in place -Cardiology following -echo was ordered to r/o device involvement by clot   Type 2 diabetes mellitus:  -cont with long-acting insulin, 42 units and placed on SSI.   -A1c of 9.3 -stop SLGT2i given concern of fungal rash to the groin and increased risk of soft tissue infection   Hyperlipidemia:  -Cont statin.   CKD stage IV:  -GFR around 28 which is at his baseline with creatinine around 2.5-2.6.  Monitor closely. -recheck bmet in AM   BPH: Cont with Flomax.   Class III obesity:  -Recommend diet/lifestyle modification   COPD:  -No audible wheezing -Cont albuterol as needed          Consultants: Cardiology Procedures performed:   Disposition: Home Diet recommendation:  Cardiac and Carb modified diet DISCHARGE MEDICATION: Allergies as of 04/26/2023       Reactions   Bee Venom Anaphylaxis   Trulicity [dulaglutide] Anaphylaxis, Diarrhea   Extra dose of Trulicity caused cardiac arrest. Patient experience onset of diarrhea with using this medication - cleared after med stopped.        Medication List     STOP taking these medications    aspirin 81 MG tablet   dapagliflozin  propanediol 10 MG Tabs tablet Commonly known as: Farxiga   doxycycline 100 MG tablet Commonly known as: VIBRA-TABS       TAKE these medications    Accu-Chek Guide test strip Generic drug: glucose blood Use as instructed   FREESTYLE LITE test strip Generic drug: glucose blood Use as instructed.  Please make sure this is compatible with [facility] 2 reader.   Accu-Chek Guide w/Device Kit UAD   Blood Glucose Monitoring Suppl Devi 1  each by Does not apply route in the morning, at noon, and at bedtime. May substitute to any manufacturer covered by patient's insurance.   accu-chek softclix lancets 1 each by Other route 3 (three) times daily.   Accu-Chek Softclix Lancets lancets Use as instructed   acetaminophen 500 MG tablet Commonly known as: TYLENOL Take 1,000 mg by mouth every 8 (eight) hours as needed.   albuterol 108 (90 Base) MCG/ACT inhaler Commonly known as: VENTOLIN HFA Inhale 2 puffs into the lungs every 6 (six) hours as needed for wheezing or shortness of breath.   amiodarone 200 MG tablet Commonly known as: PACERONE Take 1 tablet (200 mg total) by mouth daily.   apixaban 5 MG Tabs tablet Commonly known as: ELIQUIS Take 2 tablets (10 mg total) by mouth 2 (two) times daily for 14 days, THEN 1 tablet (5 mg total) 2 (two) times daily. Start taking on: April 26, 2023   atorvastatin 10 MG tablet Commonly known as: LIPITOR Take 1 tablet (10 mg total) by mouth daily.   B-D ULTRAFINE III SHORT PEN 31G X 8 MM Misc Generic drug: Insulin Pen Needle USE AS DIRECTED   cetirizine 10 MG tablet Commonly known as: EQ Allergy Relief (Cetirizine) Take 1 tablet (10 mg total) by mouth daily. What changed: when to take this   cholecalciferol 25 MCG (1000 UNIT) tablet Commonly known as: VITAMIN D3 Take 1,000 Units by mouth in the morning.   clonazePAM 0.5 MG tablet Commonly known as: KlonoPIN Take 0.5 tablets (0.25 mg total) by mouth daily AND 1 tablet (0.5 mg total) at bedtime. What changed: See the new instructions.   Entresto 24-26 MG Generic drug: sacubitril-valsartan Take 1 tablet by mouth 2 (two) times daily.   EPINEPHrine 0.3 mg/0.3 mL Soaj injection Commonly known as: EPI-PEN Inject 0.3 mg into the muscle as needed.   famotidine 20 MG tablet Commonly known as: PEPCID TAKE 1 TABLET BY MOUTH 1 TO 2 TIMES DAILY AS NEEDED What changed: See the new instructions.   fluticasone 50 MCG/ACT nasal  spray Commonly known as: FLONASE Use 1-2 sprays in each nostril once a day as needed for nasal congestion.   FreeStyle Libre 2 Reader Hardie Pulley USE AS DIRECTED   FreeStyle Libre 2 Sensor Misc Use to check blood sugar TID. Change sensor once every 14 days. E11.65   gabapentin 300 MG capsule Commonly known as: NEURONTIN Take 1 capsule by mouth at bedtime   insulin aspart 100 UNIT/ML injection Commonly known as: novoLOG Inject 24 Units into the skin 3 (three) times daily with meals.   Lantus SoloStar 100 UNIT/ML Solostar Pen Generic drug: insulin glargine Inject 42 Units into the skin daily.   loperamide 2 MG capsule Commonly known as: IMODIUM Take 1 capsule (2 mg total) by mouth as needed for diarrhea or loose stools.   methocarbamol 500 MG tablet Commonly known as: ROBAXIN Take 1 tablet (500 mg total) by mouth daily. What changed: when to take this   metoprolol succinate 50 MG 24 hr  tablet Commonly known as: TOPROL-XL Take 1 tablet (50 mg total) by mouth daily. What changed: when to take this   nystatin powder Commonly known as: MYCOSTATIN/NYSTOP Apply topically 3 (three) times daily.   sertraline 50 MG tablet Commonly known as: Zoloft Take 1 tablet (50 mg total) by mouth daily. What changed: when to take this   spironolactone 25 MG tablet Commonly known as: ALDACTONE Take 0.5 tablets (12.5 mg total) by mouth daily. What changed: when to take this   tamsulosin 0.4 MG Caps capsule Commonly known as: FLOMAX Take 2 capsules (0.8 mg total) by mouth daily. What changed: when to take this   torsemide 20 MG tablet Commonly known as: DEMADEX Take 4 tablets (80 mg total) by mouth daily. What changed: when to take this   TRELEGY ELLIPTA IN Inhale 1 puff into the lungs daily.   Trelegy Ellipta 100-62.5-25 MCG/ACT Aepb Generic drug: Fluticasone-Umeclidin-Vilant Inhale 1 puff into the lungs daily.        Follow-up Information     Marcine Matar, MD Follow up  in 2 week(s).   Specialty: Internal Medicine Why: Hospital follow up Contact information: [address] [place] [address] [phone]         Sheilah Pigeon, PA-C Follow up on 05/06/2023.   Specialty: Cardiology Why: Hospital follow up, as scheduled Contact information: [address] [place] [address] [phone]                Discharge Exam: Ceasar Mons Weights   04/24/23 1728 04/25/23 0300 04/26/23 0557  Weight: 128.8 kg 131.9 kg 131.8 kg   General exam: Awake, laying in bed, in nad Respiratory system: Normal respiratory effort, no wheezing Cardiovascular system: regular rate, s1, s2 Gastrointestinal system: Soft, nondistended, positive BS Central nervous system: CN2-12 grossly intact, strength intact Extremities: Perfused, no clubbing Skin: Normal skin turgor, no notable skin lesions seen Psychiatry: Mood normal // no visual hallucinations   Condition at discharge: fair  The results of significant diagnostics from this hospitalization (including imaging, microbiology, ancillary and laboratory) are listed below for reference.   Imaging Studies: ECHOCARDIOGRAM LIMITED  Result Date: 04/26/2023    ECHOCARDIOGRAM LIMITED REPORT   Patient Name:   HARLON WILLETTS Date of Exam: 04/26/2023 Medical Rec #:  [id number]      Height:       72.0 in Accession #:    [id number]     Weight:       290.6 lb Date of Birth:  1966-03-16      BSA:          2.497 m Patient Age:    57 years       BP:           101/65 mmHg Patient Gender: M              HR:           64 bpm. Exam Location:  Inpatient Procedure: Limited Echo and Color Doppler Indications:    I50.9* Heart failure (unspecified)  History:        Patient has prior history of Echocardiogram examinations, most                 recent 08/22/2022. CHF, Defibrillator, COPD; Risk                 Factors:Hypertension, Diabetes and Dyslipidemia.  Sonographer:    Irving Burton Senior RDCS Referring Phys: [phone]  BRIDGETTE CHRISTOPHER  Sonographer Comments: Technically difficult due to patient body habitus. IMPRESSIONS  1. Left ventricular ejection fraction, by estimation, is 25 to 30%. The left ventricle has severely decreased function. The left ventricle demonstrates global hypokinesis. Comparison(s): No significant change from prior study. Conclusion(s)/Recommendation(s): Limited echo to exclude thrombus attached to ICD lead or LV thrombus. No evidence of thrombus based on images. FINDINGS  Left Ventricle: Left ventricular ejection fraction, by estimation, is 25 to 30%. The left ventricle has severely decreased function. The left ventricle demonstrates global hypokinesis. Definity contrast agent was given IV to delineate the left ventricular endocardial borders. Additional Comments: A device lead is visualized. Color Doppler performed.  Jodelle Red MD Electronically signed by Jodelle Red MD Signature Date/Time: 04/26/2023/2:36:46 PM    Final    UE Venous Duplex ([facility] and [facility] ONLY)  Result Date: 04/24/2023 UPPER VENOUS STUDY  Patient Name:  CHRISTYAN BAFFORD  Date of Exam:   04/24/2023 Medical Rec #: [id number]       Accession #:    [id number] Date of Birth: 12/18/1965       Patient Gender: M Patient Age:   64 years Exam Location:  [facility] Procedure:      VAS [place] UPPER EXTREMITY VENOUS DUPLEX Referring Phys: LESLIE SOFIA --------------------------------------------------------------------------------  Indications: Pain, Swelling, and Edema Limitations: Patient arm extremely rigid and could not be properly positioned. Comparison Study: No prior exam. Performing Technologist: Fernande Bras  Examination Guidelines: A complete evaluation includes B-mode imaging, spectral Doppler, color Doppler, and power Doppler as needed of all accessible portions of each vessel. Bilateral testing is considered an integral part of a complete examination. Limited examinations for reoccurring indications may be  performed as noted.  Right Findings: +----------+------------+---------+-----------+----------+-------+ RIGHT     CompressiblePhasicitySpontaneousPropertiesSummary +----------+------------+---------+-----------+----------+-------+ Subclavian    Full       Yes       Yes                      +----------+------------+---------+-----------+----------+-------+  Left Findings: +----------+------------+---------+-----------+----------+-------+ LEFT      CompressiblePhasicitySpontaneousPropertiesSummary +----------+------------+---------+-----------+----------+-------+ IJV           None       No        No                       +----------+------------+---------+-----------+----------+-------+ Subclavian    None       Yes       No                       +----------+------------+---------+-----------+----------+-------+ Axillary      None       No        No                       +----------+------------+---------+-----------+----------+-------+ Brachial      None       No        No                       +----------+------------+---------+-----------+----------+-------+ Radial        Full                                          +----------+------------+---------+-----------+----------+-------+ Ulnar         Full                                          +----------+------------+---------+-----------+----------+-------+  Cephalic      None       No        No                       +----------+------------+---------+-----------+----------+-------+ Basilic       Full       Yes       Yes                      +----------+------------+---------+-----------+----------+-------+ Cephalic compressible in upper arm but occluded at the antecubital fossa into the distal forearm.  Summary:  Left: Findings consistent with acute deep vein thrombosis involving the left internal jugular vein, left axillary vein, left subclavian vein and left brachial veins. Findings  consistent with age indeterminate superficial vein thrombosis involving the left cephalic vein.  *See table(s) above for measurements and observations.  Diagnosing physician: Lemar Livings MD Electronically signed by Lemar Livings MD on 04/24/2023 at 7:01:04 PM.    Final    DG Chest 2 View  Result Date: 04/24/2023 CLINICAL DATA:  Chest pain. EXAM: CHEST - 2 VIEW COMPARISON:  Chest radiograph dated March 16, 2023. FINDINGS: Cardiomegaly. Stable left subclavian dual lead pacemaker. Mild bibasilar atelectasis. No focal consolidation. No pleural effusion. No acute osseous abnormality. Partially visualized left humeral fixation plate and screw construct. IMPRESSION: Cardiomegaly.  No acute cardiopulmonary findings. Electronically Signed   By: Hart Robinsons M.D.   On: 04/24/2023 16:50   CT ABDOMEN PELVIS WO CONTRAST  Result Date: 04/02/2023 CLINICAL DATA:  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 RADIATION DOSE REDUCTION: This exam was performed according to the departmental dose-optimization program which includes automated exposure control, adjustment of the mA and/or kV according to patient size and/or use of iterative reconstruction technique. COMPARISON:  03/17/2023 FINDINGS: Lower chest: No acute abnormality. Hepatobiliary: No focal liver abnormality is seen. Status post cholecystectomy. No biliary dilatation. Pancreas: Unremarkable. No pancreatic ductal dilatation or surrounding inflammatory changes. Spleen: Normal in size without focal abnormality. Adrenals/Urinary Tract: Adrenal glands show no bilateral adrenal nodule stable in appearance from the prior exam. These are unchanged from 2019 and consistent with benign adenomas. No further follow-up is recommended. Kidneys are well visualized bilaterally. No  renal calculi or obstructive changes are seen. The bladder is within normal limits. Stomach/Bowel: Scattered diverticular change of the colon is noted. No evidence of diverticulitis is seen. The appendix is within normal limits. Small bowel and stomach are unremarkable. Vascular/Lymphatic: Aortic atherosclerosis. No enlarged abdominal or pelvic lymph nodes. Reproductive: Prostate is within normal limits. Other: Minimal edematous changes are noted in the perineum eccentric to the right best seen on the coronal images number 77 through 65 of series 5. No focal abscess is seen. No subcutaneous air is noted. No scrotal edema is seen. Musculoskeletal: No acute or significant osseous findings. IMPRESSION: Minimal edematous changes in the perineum extrinsic to the right consistent with the given clinical history. No subcutaneous air or abscess is noted to suggest Fournier's gangrene. No definitive skin breakdown is noted. Stable adrenal lesions bilaterally dating back to 2019. Consistent with adenomas and no further follow-up is recommended. Diverticulosis without diverticulitis. Electronically Signed   By: Alcide Clever M.D.   On: 04/02/2023 20:09   XR C-ARM NO REPORT  Result Date: 03/30/2023 Please see Notes tab for imaging  impression.   Microbiology: Results for orders placed or performed during the hospital encounter of 04/02/23  Blood culture (routine x 2)     Status: None   Collection Time: 04/02/23  6:33 PM   Specimen: BLOOD  Result Value Ref Range Status   Specimen Description   Final    BLOOD BLOOD LEFT FOREARM Performed at [facility], [address] [place], [place] [address]    Special Requests   Final    Blood Culture adequate volume BOTTLES DRAWN AEROBIC AND ANAEROBIC Performed at [facility], [address], [place], [place] [address]    Culture   Final    NO GROWTH 6 DAYS Performed at [facility], [address], [place], [place]  [address]    Report Status 04/08/2023 FINAL  Final  Blood culture (routine x 2)     Status: None   Collection Time: 04/02/23  6:38 PM   Specimen: BLOOD  Result Value Ref Range Status   Specimen Description   Final    BLOOD BLOOD RIGHT FOREARM Performed at [facility], [address], [place], [place] [address]    Special Requests   Final    Blood Culture adequate volume BOTTLES DRAWN AEROBIC AND ANAEROBIC Performed at [facility], [address], [place], [place] [address]    Culture   Final    NO GROWTH 6 DAYS Performed at [facility], [address], [place], [place] [address]    Report Status 04/08/2023 FINAL  Final   *Note: Due to a large number of results and/or encounters for the requested time period, some results have not been displayed. A complete set of results can be found in Results Review.    Labs: CBC: Recent Labs  Lab 04/24/23 1257 04/25/23 0157 04/26/23 0554  WBC 6.7 5.7 5.8  HGB 14.9 13.7 13.5  HCT 45.1 42.1 42.5  MCV 94.5 94.8 94.9  PLT 178 167 171   Basic Metabolic Panel: Recent Labs  Lab 04/24/23 1257 04/24/23 1515 04/25/23 0157 04/26/23 0554  NA 139  --  139 140  K 4.0  --  3.7 3.6  CL 103  --  104 104  CO2 23  --  27 26  GLUCOSE 170*  --  134* 147*  BUN 27*  --  28* 29*  CREATININE 2.58*  --  2.50* 2.45*  CALCIUM 9.1  --  8.7* 9.1  MG  --  2.4  --   --    Liver Function Tests: Recent Labs  Lab 04/26/23 0554  AST 13*  ALT 21  ALKPHOS 51  BILITOT 0.7  PROT 6.6  ALBUMIN 3.4*   CBG: Recent Labs  Lab 04/25/23 1606 04/25/23 1738 04/25/23 2026 04/26/23 0819 04/26/23 1148  GLUCAP 226* 203* 246* 171* 160*    Discharge time spent: less than 30 minutes.  Signed: Rickey Barbara, MD [facility] 04/26/2023

## 2023-04-26 NOTE — Progress Notes (Signed)
Rounding Note    Patient Name: [name] Date of Encounter: 04/26/2023  [facility] Cardiologist: Olga Millers, MD   Subjective   Arm still swollen, but appears less so today. Awaiting echo  Inpatient Medications    Scheduled Meds:  amiodarone  200 mg Oral Daily   aspirin  81 mg Oral Daily   atorvastatin  10 mg Oral Daily   cholecalciferol  1,000 Units Oral Daily   gabapentin  300 mg Oral QHS   insulin aspart  0-15 Units Subcutaneous TID WC   insulin aspart  0-5 Units Subcutaneous QHS   insulin glargine-yfgn  42 Units Subcutaneous QHS   methocarbamol  500 mg Oral Daily   metoprolol succinate  50 mg Oral Daily   nystatin   Topical BID   sacubitril-valsartan  1 tablet Oral BID   sertraline  50 mg Oral Daily   sodium chloride flush  3 mL Intravenous Q12H   spironolactone  12.5 mg Oral Daily   tamsulosin  0.8 mg Oral Daily   torsemide  80 mg Oral Daily   Continuous Infusions:  heparin 1,700 Units/hr (04/26/23 1140)   PRN Meds: acetaminophen **OR** acetaminophen, albuterol, clonazePAM **AND** clonazePAM, famotidine, HYDROmorphone (DILAUDID) injection, ondansetron **OR** ondansetron (ZOFRAN) IV   Vital Signs    Vitals:   04/25/23 1615 04/25/23 2139 04/26/23 0557 04/26/23 0900  BP: (!) 101/59 122/66 101/65   Pulse: 70 65    Resp: 16 16 16    Temp:  97.8 F (36.6 C) 97.8 F (36.6 C) 98.3 F (36.8 C)  TempSrc:  Oral Oral Oral  SpO2: 98% 95%    Weight:   131.8 kg   Height:        Intake/Output Summary (Last 24 hours) at 04/26/2023 1336 Last data filed at 04/25/2023 2330 Gross per 24 hour  Intake 240 ml  Output 2550 ml  Net -2310 ml      04/26/2023    5:57 AM 04/25/2023    3:00 AM 04/24/2023    5:28 PM  Last 3 Weights  Weight (lbs) 290 lb 9.6 oz 290 lb 11.2 oz 284 lb  Weight (kg) 131.815 kg 131.861 kg 128.822 kg      Telemetry    SR with PVCs and intermittent pacing. No VT--this is artifact on telemetry - Personally  Reviewed  Physical Exam   GEN: No acute distress.   Neck: No JVD Cardiac: RRR, no murmurs, rubs, or gallops.  Respiratory: Clear to auscultation bilaterally. GI: Soft, nontender, non-distended  MS: LUE diffusely swollen and mildly tender. Palpable pulses Neuro:  Nonfocal  Psych: Normal affect   New pertinent results (labs, ECG, imaging, cardiac studies)     Patient Profile     57 y.o. male with a hx of NICM s/p ICD (12/23/2013, addition of LA lead in 05/2022), VT, DM II, CKD stage IIIb-IV followed by Dr. Vallery Sa, COPD and history of normal coronary arteries on cath 2015 who is being seen for evaluation of left arm DVT at the request of Dr. Rush Landmark.   Assessment & Plan    LUE DVT -pending limited echo today to evaluate device wires for thrombus as well as exclude LV thrombus -on heparin now, planned to transition to DOAC. No significant CAD based on cath 2023, can stop aspirin when DOAC started -[place] does not show that DVT crosses the midline or involves the SVC  NICM s/p ICD History of VT CKD stage 3b-4 -stable -continue amiodarone, metoprolol -continue entresto -continue  spironolactone -stop SLGT2i as below  Type II diabetes -SGLT2i stopped given history of fungal infection and gluteal abscess -on aspirin, statin. Given that he does not have significant CAD, can stop aspirin once DOAC started and then restart aspirin when DOAC duration complete  COPD -per primary team    Signed, Jodelle Red, MD  04/26/2023, 1:36 PM

## 2023-04-26 NOTE — Progress Notes (Signed)
ANTICOAGULATION CONSULT NOTE   Pharmacy Consult for Heparin Indication: Left UE DVT  Allergies  Allergen Reactions   Bee Venom Anaphylaxis   Trulicity [Dulaglutide] Anaphylaxis and Diarrhea    Extra dose of Trulicity caused cardiac arrest. Patient experience onset of diarrhea with using this medication - cleared after med stopped.    Patient Measurements: Height: 6' (182.9 cm) Weight: 131.9 kg (290 lb 11.2 oz) IBW/kg (Calculated) : 77.6 Heparin Dosing Weight: 107.9 kg  Vital Signs: Temp: 97.8 F (36.6 C) (11/16 2139) Temp Source: Oral (11/16 2139) BP: 101/59 (11/16 1615) Pulse Rate: 70 (11/16 1615)  Labs: Recent Labs    04/24/23 1257 04/24/23 1515 04/25/23 0157 04/25/23 1401 04/25/23 2330  HGB 14.9  --  13.7  --   --   HCT 45.1  --  42.1  --   --   PLT 178  --  167  --   --   HEPARINUNFRC  --   --  0.70 0.75* 0.47  CREATININE 2.58*  --  2.50*  --   --   CKTOTAL  --  210  --   --   --   TROPONINIHS 11 8  --   --   --     Estimated Creatinine Clearance: 45.2 mL/min (A) (by C-G formula based on SCr of 2.5 mg/dL (H)).   Medical History: Past Medical History:  Diagnosis Date   AICD (automatic cardioverter/defibrillator) present    Chronic bronchitis (HCC)    "get it most q yr" (12/23/2013)   Chronic systolic (congestive) heart failure (HCC)    Fracture of left humerus    a. 07/2013.   GERD (gastroesophageal reflux disease)    not at present time   Heart murmur    "born w/it"    History of renal calculi    Hypertension    Kidney stones    NICM (nonischemic cardiomyopathy) (HCC)    a. 07/2013 Echo: EF 20-25%, diff HK, Gr2 DD, mild MR, mod dil LA/RA. 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   04/24/2023   albuterol (VENTOLIN HFA) 108 (90 Base) MCG/ACT inhaler Inhale 2 puffs into the lungs every 6 (six) hours as needed for wheezing or shortness of breath. 18 g 2 Unk   amiodarone (PACERONE) 200 MG tablet Take 1 tablet (200 mg total) by mouth daily. 90 tablet 3 04/24/2023   aspirin 81 MG tablet Take 81 mg by mouth daily.   04/24/2023   atorvastatin (LIPITOR) 10 MG tablet Take 1 tablet (10 mg total) by mouth daily. 90 tablet 1 04/24/2023   cetirizine (EQ ALLERGY RELIEF, CETIRIZINE,) 10 MG tablet Take 1 tablet (10 mg total) by mouth daily. (Patient taking differently: Take 10 mg by mouth in the morning.) 90 tablet 1 04/24/2023   cholecalciferol (VITAMIN D3) 25 MCG (1000 UNIT) tablet Take 1,000 Units by mouth in the morning.   04/24/2023   clonazePAM (KLONOPIN) 0.5 MG tablet Take 0.5 tablets (0.25 mg total) by mouth daily AND 1 tablet (0.5 mg total) at bedtime. (Patient taking differently: Take 0.5 tablets (0.25 mg  total) by mouth in the morning and 1 tablet (0.5 mg total) at bedtime for a total daily dose of 0.75mg . ) 45 tablet 1 04/24/2023   dapagliflozin propanediol (FARXIGA) 10 MG TABS tablet Take 1 tablet (10 mg total) by mouth daily before breakfast. 90 tablet 3 04/24/2023   doxycycline (VIBRA-TABS) 100 MG tablet Take 1 tablet (100 mg total) by mouth every 12 (twelve) hours. 10 tablet 0 04/24/2023   EPINEPHrine 0.3 mg/0.3 mL IJ SOAJ injection Inject 0.3 mg into the muscle as needed. 1 each 1 Unk   famotidine (PEPCID) 20 MG tablet TAKE 1 TABLET BY MOUTH 1 TO 2 TIMES DAILY AS NEEDED (Patient taking differently: Take 20 mg by mouth See admin instructions. TAKE 1 TABLET BY MOUTH 1 TO 2 TIMES DAILY AS NEEDED) 180 tablet 0 04/24/2023   fluticasone (FLONASE) 50 MCG/ACT nasal spray Use 1-2 sprays in each nostril once a day as needed for nasal congestion. 16 g 5 Unk    Fluticasone-Umeclidin-Vilant (TRELEGY ELLIPTA IN) Inhale 1 puff into the lungs daily.   04/24/2023   Fluticasone-Umeclidin-Vilant (TRELEGY ELLIPTA) 100-62.5-25 MCG/ACT AEPB Inhale 1 puff into the lungs daily. 60 each 1 04/24/2023   gabapentin (NEURONTIN) 300 MG capsule Take 1 capsule by mouth at bedtime 90 capsule 1 04/23/2023   insulin aspart (NOVOLOG) 100 UNIT/ML injection Inject 24 Units into the skin 3 (three) times daily with meals.   04/24/2023   insulin glargine (LANTUS SOLOSTAR) 100 UNIT/ML Solostar Pen Inject 42 Units into the skin daily.   04/24/2023   loperamide (IMODIUM) 2 MG capsule Take 1 capsule (2 mg total) by mouth as needed for diarrhea or loose stools. 12 capsule 0 Unk   methocarbamol (ROBAXIN) 500 MG tablet Take 1 tablet (500 mg total) by mouth daily. (Patient taking differently: Take 500 mg by mouth at bedtime.) 90 tablet 1 04/23/2023   metoprolol succinate (TOPROL-XL) 50 MG 24 hr tablet Take 1 tablet (50 mg total) by mouth daily. (Patient taking differently: Take 50 mg by mouth in the morning.) 90 tablet 3 04/24/2023   nystatin (MYCOSTATIN/NYSTOP) powder Apply topically 3 (three) times daily. 15 g 0 04/24/2023   sacubitril-valsartan (ENTRESTO) 24-26 MG Take 1 tablet by mouth 2 (two) times daily. 60 tablet 5 04/24/2023   sertraline (ZOLOFT) 50 MG tablet Take 1 tablet (50 mg total) by mouth daily. (Patient taking differently: Take 50 mg by mouth in the morning.) 90 tablet 0 04/24/2023   spironolactone (ALDACTONE) 25 MG tablet Take 0.5 tablets (12.5 mg total) by mouth daily. (Patient taking differently: Take 12.5 mg by mouth in the morning.) 30 tablet 5 04/24/2023   tamsulosin (FLOMAX) 0.4 MG CAPS capsule Take 2 capsules (0.8 mg total) by mouth daily. (Patient taking differently: Take 0.8 mg by mouth in the morning.) 180 capsule 1 04/24/2023   torsemide (DEMADEX) 20 MG tablet Take 4 tablets (80 mg total) by mouth daily. (Patient taking differently: Take 80 mg by mouth in the morning.)  120 tablet 3 04/24/2023   Accu-Chek Softclix Lancets lancets Use as instructed 100 each 12    Blood Glucose Monitoring Suppl (ACCU-CHEK GUIDE) w/Device KIT UAD 1 kit 0    Blood Glucose Monitoring Suppl DEVI 1 each by Does not apply route in the morning, at noon, and at bedtime. May substitute to any manufacturer covered by patient's insurance. 1 each 0    Continuous Glucose Receiver (FREESTYLE LIBRE 2 READER) DEVI USE AS DIRECTED 1 each 0    Continuous Glucose Sensor (FREESTYLE LIBRE  2 SENSOR) MISC Use to check blood sugar TID. Change sensor once every 14 days. E11.65 2 each 11    glucose blood (ACCU-CHEK GUIDE) test strip Use as instructed 100 each 12    glucose blood (FREESTYLE LITE) test strip Use as instructed.  Please make sure this is compatible with [facility] 2 reader. 100 each 12    Insulin Pen Needle (B-D ULTRAFINE III SHORT PEN) 31G X 8 MM MISC USE AS DIRECTED 100 each 6    Lancet Devices (ACCU-CHEK SOFTCLIX) lancets 1 each by Other route 3 (three) times daily. 1 each 0    Scheduled:   amiodarone  200 mg Oral Daily   aspirin  81 mg Oral Daily   atorvastatin  10 mg Oral Daily   cholecalciferol  1,000 Units Oral Daily   gabapentin  300 mg Oral QHS   insulin aspart  0-15 Units Subcutaneous TID WC   insulin aspart  0-5 Units Subcutaneous QHS   insulin glargine-yfgn  42 Units Subcutaneous QHS   methocarbamol  500 mg Oral Daily   metoprolol succinate  50 mg Oral Daily   nystatin   Topical BID   sacubitril-valsartan  1 tablet Oral BID   sertraline  50 mg Oral Daily   sodium chloride flush  3 mL Intravenous Q12H   spironolactone  12.5 mg Oral Daily   tamsulosin  0.8 mg Oral Daily   torsemide  80 mg Oral Daily   Infusions:   heparin 1,700 Units/hr (04/25/23 1917)   PRN: acetaminophen **OR** acetaminophen, albuterol, clonazePAM **AND** clonazePAM, famotidine, HYDROmorphone (DILAUDID) injection, ondansetron **OR** ondansetron (ZOFRAN) IV  Assessment: 57 yom with a history of T2DM, CKD,  non-ischemic cardiomyopathy, HF, ICD, COPD, obesity. 10/24-29 admission for groin cellulitis/infection. Patient is presenting with swelling in left hand extending to arm and chest area. Heparin per pharmacy consult placed for  Left UE DVT .  Patient is not on anticoagulation prior to arrival.  Heparin level supratherapeutic at 0.75 on 1800 units/hr. Hgb 13.7, plt wnl. No issues with the infusion or signs of bleeding per RN.  11/17 AM update:  Heparin level therapeutic    Goal of Therapy:  Heparin level 0.3-0.7 units/ml Monitor platelets by anticoagulation protocol: Yes   Plan:  Cont heparin 1700 units/hr Heparin level with AM labs  Abran Duke, PharmD, BCPS Clinical Pharmacist Phone: [phone]

## 2023-04-27 ENCOUNTER — Other Ambulatory Visit (HOSPITAL_COMMUNITY): Payer: Self-pay

## 2023-04-27 ENCOUNTER — Telehealth: Payer: Self-pay | Admitting: Internal Medicine

## 2023-04-27 ENCOUNTER — Telehealth: Payer: Self-pay

## 2023-04-27 ENCOUNTER — Encounter: Payer: Medicaid Other | Attending: Cardiology

## 2023-04-27 ENCOUNTER — Other Ambulatory Visit: Payer: Self-pay

## 2023-04-27 ENCOUNTER — Encounter: Payer: Self-pay | Admitting: Internal Medicine

## 2023-04-27 DIAGNOSIS — I5042 Chronic combined systolic (congestive) and diastolic (congestive) heart failure: Secondary | ICD-10-CM

## 2023-04-27 DIAGNOSIS — Z95810 Presence of automatic (implantable) cardiac defibrillator: Secondary | ICD-10-CM

## 2023-04-27 NOTE — Telephone Encounter (Signed)
Please let us know previous tried and failed therapies.

## 2023-04-27 NOTE — Telephone Encounter (Signed)
[facility] calling stating in order for the Trelegy to be approved we need to let them know we "tried and failed" at least 2 of the the following medications.    Advair Discus Symbicort 160-4.5 mcg Dulera Enovo Elipta Atravent HFA Combavent Respimat Encruse Elipta Spiriva Respimat   [phone] [facility] Dept.
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
## 2023-04-27 NOTE — Transitions of Care (Post Inpatient/ED Visit) (Signed)
   04/27/2023  Name: [name] MRN: [id number] DOB: July 29, 1965  Today's TOC FU Call Status: Today's TOC FU Call Status:: Successful TOC FU Call Completed TOC FU Call Complete Date: 04/27/23 Patient's Name and Date of Birth confirmed.  Transition Care Management Follow-up Telephone Call Date of Discharge: 04/26/23 Discharge Facility: [facility]) Type of Discharge: Inpatient Admission Primary Inpatient Discharge Diagnosis:: Acute DVT LUE How have you been since you were released from the hospital?: Same (he stated he is exhausted and his arm is still hurting.) Any questions or concerns?: Yes Patient Questions/Concerns:: He stated that te fungal rash of his groin has not improved despite the use of the rx powder.   he note that there is an unpleasant odor and the itching has increased. I told him that I would notify PCP. Patient Questions/Concerns Addressed: Notified Provider of Patient Questions/Concerns  Items Reviewed: Did you receive and understand the discharge instructions provided?: Yes Medications obtained,verified, and reconciled?: Partial Review Completed Reason for Partial Mediation Review: He said he has all of his medication, except the trelegy. He explained that the insurance company sent a request for a PA to the pulmonologist. He confirmed he has the Eliquis and said he didn't have any questions about the medications and did not need to review the med list.  He also noted that he has a [employer]. Any new allergies since your discharge?: No Dietary orders reviewed?: Yes Type of Diet Ordered:: heart healthy, low sodium, diabetic Do you have support at home?: Yes People in Home: sibling(s) Name of Support/Comfort Primary Source: his brother  Medications Reviewed Today: Medications Reviewed Today   Medications were not reviewed in this encounter     Home Care and Equipment/Supplies: Were Home Health Services Ordered?: No Any new equipment or medical  supplies ordered?: No  Functional Questionnaire: Do you need assistance with bathing/showering or dressing?: No Do you need assistance with meal preparation?: No Do you need assistance with eating?: No Do you have difficulty maintaining continence: No Do you need assistance with getting out of bed/getting out of a chair/moving?: No Do you have difficulty managing or taking your medications?: No  Follow up appointments reviewed: PCP Follow-up appointment confirmed?: Yes Date of PCP follow-up appointment?: 05/25/23 Follow-up Provider: Dr Laural Benes- he requested a late afternoon appointment.   he also said that he needs to make sure this time does not conflict with another appointment he may have outside of [facility]. Specialist Hospital Follow-up appointment confirmed?: Yes Date of Specialist follow-up appointment?: 05/05/23 Follow-Up Specialty Provider:: podiatry, 05/06/2023: cardiology; 05/13/2023: [facility]; 06/29/2023-pulmonary Do you need transportation to your follow-up appointment?: No Do you understand care options if your condition(s) worsen?: Yes-patient verbalized understanding    SIGNATURE Robyne Peers, RN

## 2023-04-27 NOTE — Telephone Encounter (Signed)
From [facility] call:  : He stated that te fungal rash of his groin has not improved despite the use of the rx powder. he noted that there is an unpleasant odor and the itching has increased. I told him that I would notify PCP.    He has all of his medications except the Trelegy.  His insurance company is waiting for a PA from th pulmonologist.   Appointment with Dr Laural Benes - 05/25/2023.  He wanted an appointment as late in the afternoon as possible

## 2023-04-28 ENCOUNTER — Ambulatory Visit (HOSPITAL_BASED_OUTPATIENT_CLINIC_OR_DEPARTMENT_OTHER): Payer: Medicaid Other | Admitting: Psychiatry

## 2023-04-28 ENCOUNTER — Other Ambulatory Visit: Payer: Self-pay | Admitting: Internal Medicine

## 2023-04-28 DIAGNOSIS — F4310 Post-traumatic stress disorder, unspecified: Secondary | ICD-10-CM | POA: Diagnosis not present

## 2023-04-28 DIAGNOSIS — F410 Panic disorder [episodic paroxysmal anxiety] without agoraphobia: Secondary | ICD-10-CM

## 2023-04-28 DIAGNOSIS — F411 Generalized anxiety disorder: Secondary | ICD-10-CM | POA: Diagnosis not present

## 2023-04-28 MED ORDER — SERTRALINE HCL 50 MG PO TABS: 50.0000 mg | ORAL_TABLET | Freq: Every morning | ORAL | 2 refills | Status: DC

## 2023-04-28 MED ORDER — CLONAZEPAM 0.5 MG PO TABS: 0.5000 mg | ORAL_TABLET | Freq: Two times a day (BID) | ORAL | 1 refills | Status: DC | PRN

## 2023-04-28 NOTE — Progress Notes (Signed)
EPIC Encounter for ICM Monitoring  Patient Name: [name] is a 57 y.o. male Date: 04/28/2023 Primary Care Physican: Marcine Matar, MD Primary Cardiologist: Crenshaw/[name] Electrophysiologist: Lalla Brothers 06/17/2022 Office Weight: 251 lbs 08/04/2022 Weight: 271 lbs 08/12/2022 Weight: 271 lbs 09/22/2022 Weight: 281 lbs 12/03/2022 Weight: 282 lbs 01/07/2023 Weight: 280 lbs 02/13/2023 Weight: 297 lbs 03/25/2023 Weight: 297 lbs                           Transmission reviewed.     Hospitalized from 11/15-11/17 with dx of Left upper extremity DVT.   Corvue thoracic impedance suggesting normal fluid levels with the exception of possible fluid accumulation from 11/12-11/17 which correlates with hospitalization from 11/15-11/17.   Prescribed dosage:  Furosemide 20 mg take 4 tablet(s) (80 mg total) by mouth daily.    Spironolactone 25 mg take 1 tablet (0.5 mg total) by mouth once a day.    Labs: 04/26/2023 Creatinine 2.45, BUN 29, Potassium 3.6, Sodium 140, GFR 30  04/25/2023 Creatinine 2.50, BUN 28, Potassium 3.7, Sodium 139, GFR 29  04/24/2023 Creatinine 2.58, BUN 27, Potassium 4.0, Sodium 139, GFR 28  04/05/2023 Creatinine 2.52, BUN 43, Potassium 4.3, Sodium 140, GFR 29 04/04/2023 Creatinine 2.83, BUN 42, Potassium 4.5, Sodium 139, GFR 25  04/03/2023 Creatinine 2.65, BUN 41, Potassium 4.0, Sodium 141, GFR 27  04/02/2023 Creatinine 3.14, BUN 44, Potassium 4.0, Sodium 134, GFR 22  A complete set of results can be found in Results Review.   Recommendations:  No changes   Follow-up plan: ICM clinic phone appointment on 06/08/2023.   91 day device clinic remote transmission 05/20/2023.     EP/Cardiology Office Visits:   05/06/2023 with Yevette Edwards, PA.  05/13/2023 with HF Clinic.   Copy of ICM check sent to Dr Lalla Brothers.    3 month ICM trend: 04/27/2023.    12-14 Month ICM trend:     Karie Soda, RN 04/28/2023 9:39 AM

## 2023-04-29 ENCOUNTER — Other Ambulatory Visit: Payer: Self-pay | Admitting: Internal Medicine

## 2023-04-29 MED ORDER — CLOTRIMAZOLE 1 % EX OINT: TOPICAL_OINTMENT | CUTANEOUS | 0 refills | Status: DC

## 2023-05-04 NOTE — Progress Notes (Unsigned)
Cardiology Office Note Date:  05/04/2023  Patient ID:  [name], [name] Oct 28, 1965, MRN [id number] PCP:  Marcine Matar, MD  Cardiologist:  Dr. Jens Som Electrophysiologist: Dr. Johney Frame >> Dr. Lalla Brothers    Chief Complaint:  *** 6 mo  History of Present Illness: [name] is a 57 y.o. male with history of NICM/chronic systolic CHF s/p ICD, normal coronary arteries by cath 2015, GERD, HTN and DM2, VT/VF.  Admitted 05/03/22 with VT storm, 22 ICD shocks and 2 ATP, 10 aborted shocks on initial arrival. Felt to be likely in setting of hypokalemia, diarrhea, and infection. However had recurrent VT despite management and correction of both and back on amiodarone gtt  >>  PO amiodarone > developed bradycardia and intermittent RV pacing that triggered NSPMVT episodes, ultimately felt to be pause dependent and not pacing triggered. R/LHC looked good, volume stable, no obstructive CAD, LVEF stable 30-35% Underwent device upgrade to a dual chamber device 05/15/22 by Dr. Lalla Brothers EP signed off 05/16/22 cleared to start BB of HF team's choice. Discharged 05/17/22 Toprol 25mg  daily Amiodarone 400mg  BID to 12/9 > 400mg  daily until f/u and reassessment no SGLT2 w/ perineal infection  PRN lasix  I saw him 05/23/22 He has had a couple episodes that he felt like he was either having panic/anxiety or tachycardia. He is scared about getting shocked again. He has not had CP, no near syncope or syncope. With these episodes he feels breathless, and has the sense of palpitations that are reminiscent of how he felt before getting shocked. He had similar symptoms while on the programmer, made him feel like he was about to be shocked with normal rhythm and no testing in progress. He was referred to Dr. Bosie Clos, as an urgent referral  He saw HF team 05/26/22, toprol increased, spiro decreased (2/2 elevated K+), Farxiga increased, planned for cardiac rehab, started on Prozac  ER visit 12/20-21/23, SOB,  anxiety, palpitations, rx ativan to get him to his behavioral health visit  Hospitalized 06/14/22 - 06/16/22 some SOB, palpitations, CP, largely all felt to be 2/2 anxiety/panic, hydroxyzine added  Behavioral ER visit with anxiety/panic Sees psychiatry MD next week  I saw him 07/07/22 He is doing pretty good Continues to be quite worried/anxious, worried about dying. He has a sense of unease in his chest. Continues to have episodic/reflexive sudden deep breaths. The prozac was helping him, but unfortunately developed strange suicidal thoughts and was told that can happen with prozac and advised to stop it at behavioral ER No near syncope or syncope No shocks He gets winded easily, has to rest after a shower, and feels likehe stays winded for a long time after physical exertion. He was seen by the HF pharmacy team, advised to increase his Entresto dose, but has not done that yet, worried about his kidney function. Volume stable Pending further f/u with psychiatry Planned to see Dr. Lalla Brothers as scheduled  Saw Dr. Lalla Brothers 08/19/22, , amio labs done, planned to re-establish with HF clinic  Saw Dr. Gasper Lloyd last 02/23/23, bloated, up in weight, not using his COPD inhalers 2/2 uncomfortable feeling they give him in his chest. Diuretic pulsed with torsemide for volume OL  Ultimately admitted with HF exacerbation 02/28/23 diuresed Discharged 03/03/23  Admitted 04/02/23 R groin pain > cellulitis, CT scan reassuring without subcutaneous gas or findings concerning for Fournier's gangrene  Felt to be euvolemic this admission Discharged 04/07/23   Admitted 04/24/23 w/LUE swelling found with DVT, discharged 04/26/23 on  eliquis SGLT2i stopped given his groin cellulitis recently/fungal infection  *** eliquis, bleeding, dose w/PMD *** volume *** device only *** has HF team 12/4 *** amio labs    Device information [employer] single chamber ICD implanted 12/23/2013 >> upgraded to dual chamber devce ( A  lead added) 05/15/22  + appropriate tx, VT storm  AAD hx Amiodarone started Nov 2023  Past Medical History:  Diagnosis Date   AICD (automatic cardioverter/defibrillator) present    Chronic bronchitis (HCC)    "get it most q yr" (12/23/2013)   Chronic systolic (congestive) heart failure (HCC)    Fracture of left humerus    a. 07/2013.   GERD (gastroesophageal reflux disease)    not at present time   Heart murmur    "born w/it"    History of renal calculi    Hypertension    Kidney stones    NICM (nonischemic cardiomyopathy) (HCC)    a. 07/2013 Echo: EF 20-25%, diff HK, Gr2 DD, mild MR, mod dil LA/RA. 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    Shockable heart rhythm detected by automated external defibrillator    Syncope    Type II diabetes mellitus (HCC) 2006    Past Surgical History:  Procedure Laterality Date   CARDIAC CATHETERIZATION  08/10/13   CHOLECYSTECTOMY  ~ 2010   IMPLANTABLE CARDIOVERTER DEFIBRILLATOR IMPLANT  12/23/2013   STJ Fortify ICD implanted by Dr Johney Frame for cardiomyopathy and syncope   IMPLANTABLE CARDIOVERTER DEFIBRILLATOR IMPLANT N/A 12/23/2013   Procedure: IMPLANTABLE CARDIOVERTER DEFIBRILLATOR IMPLANT;  Surgeon: Gardiner Rhyme, MD;  Location[facility] CATH LAB;  Service: Cardiovascular;  Laterality: N/A;   INGUINAL HERNIA REPAIR Left 03/01/2018   Procedure: OPEN REPAIR OF LEFT INGUINAL HERNIA WITH MESH;  Surgeon: Gaynelle Adu, MD;  Location: [facility] ORS;  Service: General;  Laterality: Left;   LEAD REVISION/REPAIR N/A 05/15/2022   Procedure: LEAD REVISION/REPAIR;  Surgeon: Lanier Prude, MD;  Location: [facility] INVASIVE CV LAB;  Service: Cardiovascular;  Laterality: N/A;   LEFT HEART CATHETERIZATION WITH CORONARY ANGIOGRAM N/A 08/10/2013   Procedure: LEFT HEART CATHETERIZATION WITH CORONARY ANGIOGRAM;  Surgeon: Corky Crafts, MD;  Location: [facility] CATH LAB;  Service: Cardiovascular;  Laterality:  N/A;   ORIF HUMERUS FRACTURE Left 08/12/2013   Procedure: OPEN REDUCTION INTERNAL FIXATION (ORIF) HUMERAL SHAFT FRACTURE;  Surgeon: Nadara Mustard, MD;  Location: [facility] OR;  Service: Orthopedics;  Laterality: Left;  Open Reduction Internal Fixation Left Humerus   RIGHT/LEFT HEART CATH AND CORONARY ANGIOGRAPHY N/A 05/12/2022   Procedure: RIGHT/LEFT HEART CATH AND CORONARY ANGIOGRAPHY;  Surgeon: Dorthula Nettles, DO;  Location: [facility] INVASIVE CV LAB;  Service: Cardiovascular;  Laterality: N/A;   URETEROSCOPY     "laser for kidney stones"    Current Outpatient Medications  Medication Sig Dispense Refill   Accu-Chek Softclix Lancets lancets Use as instructed 100 each 12   acetaminophen (TYLENOL) 500 MG tablet Take 1,000 mg by mouth every 8 (eight) hours as needed.     albuterol (VENTOLIN HFA) 108 (90 Base) MCG/ACT inhaler Inhale 2 puffs into the lungs every 6 (six) hours as needed for wheezing or shortness of breath. 18 g 2   amiodarone (PACERONE) 200 MG tablet Take 1 tablet (200 mg total) by mouth daily. 90 tablet 3   apixaban (ELIQUIS) 5 MG TABS tablet Take 2 tablets (10 mg total) by mouth 2 (two) times daily for 14 days, THEN 1 tablet (  5 mg total) 2 (two) times daily. 116 tablet 0   atorvastatin (LIPITOR) 10 MG tablet Take 1 tablet (10 mg total) by mouth daily. 90 tablet 1   Blood Glucose Monitoring Suppl (ACCU-CHEK GUIDE) w/Device KIT UAD 1 kit 0   Blood Glucose Monitoring Suppl DEVI 1 each by Does not apply route in the morning, at noon, and at bedtime. May substitute to any manufacturer covered by patient's insurance. 1 each 0   cetirizine (EQ ALLERGY RELIEF, CETIRIZINE,) 10 MG tablet Take 1 tablet (10 mg total) by mouth daily. (Patient taking differently: Take 10 mg by mouth in the morning.) 90 tablet 1   cholecalciferol (VITAMIN D3) 25 MCG (1000 UNIT) tablet Take 1,000 Units by mouth in the morning.     clonazePAM (KLONOPIN) 0.5 MG tablet Take 1 tablet (0.5 mg total) by mouth 2 (two) times daily as  needed for anxiety. 60 tablet 1   Clotrimazole 1 % OINT Apply to the affected area twice a day 56.7 g 0   Continuous Glucose Receiver (FREESTYLE LIBRE 2 READER) DEVI USE AS DIRECTED 1 each 0   Continuous Glucose Sensor (FREESTYLE LIBRE 2 SENSOR) MISC Use to check blood sugar TID. Change sensor once every 14 days. E11.65 2 each 11   EPINEPHrine 0.3 mg/0.3 mL IJ SOAJ injection Inject 0.3 mg into the muscle as needed. 1 each 1   famotidine (PEPCID) 20 MG tablet TAKE 1 TABLET BY MOUTH 1 TO 2 TIMES DAILY AS NEEDED (Patient taking differently: Take 20 mg by mouth See admin instructions. TAKE 1 TABLET BY MOUTH 1 TO 2 TIMES DAILY AS NEEDED) 180 tablet 0   fluticasone (FLONASE) 50 MCG/ACT nasal spray Use 1-2 sprays in each nostril once a day as needed for nasal congestion. 16 g 5   Fluticasone-Umeclidin-Vilant (TRELEGY ELLIPTA IN) Inhale 1 puff into the lungs daily.     Fluticasone-Umeclidin-Vilant (TRELEGY ELLIPTA) 100-62.5-25 MCG/ACT AEPB Inhale 1 puff into the lungs daily. 60 each 1   gabapentin (NEURONTIN) 300 MG capsule Take 1 capsule by mouth at bedtime 90 capsule 1   glucose blood (ACCU-CHEK GUIDE) test strip Use as instructed 100 each 12   glucose blood (FREESTYLE LITE) test strip Use as instructed.  Please make sure this is compatible with [facility] 2 reader. 100 each 12   insulin aspart (NOVOLOG) 100 UNIT/ML injection Inject 24 Units into the skin 3 (three) times daily with meals.     insulin glargine (LANTUS SOLOSTAR) 100 UNIT/ML Solostar Pen Inject 42 Units into the skin daily.     Insulin Pen Needle (B-D ULTRAFINE III SHORT PEN) 31G X 8 MM MISC USE AS DIRECTED 100 each 6   Lancet Devices (ACCU-CHEK SOFTCLIX) lancets 1 each by Other route 3 (three) times daily. 1 each 0   loperamide (IMODIUM) 2 MG capsule Take 1 capsule (2 mg total) by mouth as needed for diarrhea or loose stools. 12 capsule 0   methocarbamol (ROBAXIN) 500 MG tablet Take 1 tablet (500 mg total) by mouth daily. (Patient taking  differently: Take 500 mg by mouth at bedtime.) 90 tablet 1   metoprolol succinate (TOPROL-XL) 50 MG 24 hr tablet Take 1 tablet (50 mg total) by mouth daily. (Patient taking differently: Take 50 mg by mouth in the morning.) 90 tablet 3   nystatin (MYCOSTATIN/NYSTOP) powder Apply topically 3 (three) times daily. 15 g 0   sacubitril-valsartan (ENTRESTO) 24-26 MG Take 1 tablet by mouth 2 (two) times daily. 60 tablet 5   sertraline (ZOLOFT)  50 MG tablet Take 1 tablet (50 mg total) by mouth in the morning. 30 tablet 2   spironolactone (ALDACTONE) 25 MG tablet Take 0.5 tablets (12.5 mg total) by mouth daily. (Patient taking differently: Take 12.5 mg by mouth in the morning.) 30 tablet 5   tamsulosin (FLOMAX) 0.4 MG CAPS capsule Take 2 capsules (0.8 mg total) by mouth daily. (Patient taking differently: Take 0.8 mg by mouth in the morning.) 180 capsule 1   torsemide (DEMADEX) 20 MG tablet Take 4 tablets (80 mg total) by mouth daily. (Patient taking differently: Take 80 mg by mouth in the morning.) 120 tablet 3   No current facility-administered medications for this visit.    Allergies:   Bee venom and Trulicity [dulaglutide]   Social History:  The patient  reports that he quit smoking about 8 years ago. His smoking use included cigarettes. He started smoking about 36 years ago. He has a 28 pack-year smoking history. He has never used smokeless tobacco. He reports current alcohol use. He reports current drug use. Drug: Marijuana.   Family History:  The patient's family history includes Arthritis in his father and mother; Diabetes in his father and mother; Hypertension in his brother, father, and mother.  ROS:  Please see the history of present illness.    All other systems are reviewed and otherwise negative.   PHYSICAL EXAM:  VS:  There were no vitals taken for this visit. BMI: There is no height or weight on file to calculate BMI. Well nourished, well developed, in no acute distress HEENT:  normocephalic, atraumatic Neck: no JVD, carotid bruits or masses Cardiac: ***  RRR; no significant murmurs, no rubs, or gallops Lungs: *** CTA b/l, no wheezing, rhonchi or rales Abd: soft, nontender MS: no deformity or atrophy Ext: *** no edema Skin: warm and dry, no rash Neuro:  No gross deficits appreciated Psych: euthymic mood, full affect  *** ICD site: is well healed, no signs of infection   EKG:  not done today  Device interrogation done today and reviewed by myself:  *** Battery and lead measurements are good ***   04/26/23: TTE (limited) 1. Left ventricular ejection fraction, by estimation, is 25 to 30%. The  left ventricle has severely decreased function. The left ventricle  demonstrates global hypokinesis.   Comparison(s): No significant change from prior study.   Conclusion(s)/Recommendation(s): Limited echo to exclude thrombus attached  to ICD lead or LV thrombus. No evidence of thrombus based on images.    08/22/2022: LVEF 35%, normal RV function   05/12/22: R/LHC HEMODYNAMICS: RA:                  1 mmHg (mean) RV:                  22/1-3 mmHg PA:                  23/6 mmHg (12 mean) PCWP:            4 mmHg (mean)                                      Estimated Fick CO/CI   7 L/min, 2.9 L/min/m2  TPG                 8  mmHg                                              PVR                 ~1 Wood Units  PAPi                >5       IMPRESSION: Low pre and post capillary filling pressures.  Normal cardiac output/cardiac index.  Normal PVR & PA mean No obstructive CAD, left dominant w/ large Lcx supplying multiple large marginals.    05/06/22: TTE 1. Left ventricular ejection fraction, by estimation, is 30 to 35%. The  left ventricle has moderately decreased function. The left ventricle  demonstrates global hypokinesis. The left ventricular internal cavity size  was moderately dilated. There is mild    eccentric left ventricular hypertrophy. Left ventricular diastolic  parameters are consistent with Grade I diastolic dysfunction (impaired  relaxation).   2. Right ventricular systolic function is normal. The right ventricular  size is normal. Tricuspid regurgitation signal is inadequate for assessing  PA pressure.   3. Left atrial size was moderately dilated.   4. The mitral valve is normal in structure. No evidence of mitral valve  regurgitation.   5. The aortic valve is tricuspid. Aortic valve regurgitation is not  visualized. No aortic stenosis is present.   6. Aortic dilatation noted. There is borderline dilatation of the aortic  root, measuring 38 mm.   7. The inferior vena cava is normal in size with greater than 50%  respiratory variability, suggesting right atrial pressure of 3 mmHg.   Comparison(s): No significant change from prior study. Prior images  reviewed side by side.    Recent Labs: 07/23/2022: Pro B Natriuretic peptide (BNP) 31.0 08/19/2022: TSH 0.930 04/05/2023: B Natriuretic Peptide 57.4 04/24/2023: Magnesium 2.4 04/26/2023: ALT 21; BUN 29; Creatinine, Ser 2.45; Hemoglobin 13.5; Platelets 171; Potassium 3.6; Sodium 140  02/13/2023: Chol/HDL Ratio 3.8; Cholesterol, Total 166; HDL 44; LDL Chol Calc (NIH) 76; Triglycerides 283   Estimated Creatinine Clearance: 47 mL/min (A) (by C-G formula based on SCr of 2.45 mg/dL (H)).   Wt Readings from Last 3 Encounters:  04/26/23 290 lb 9.6 oz (131.8 kg)  04/24/23 294 lb (133.4 kg)  04/14/23 293 lb 3.2 oz (133 kg)     Other studies reviewed: Additional studies/records reviewed today include: summarized above  ASSESSMENT AND PLAN:  ICD *** Intact function *** no programming changes made  VT/VF *** Amiodarone No arrhythmias ***  NICM Chronic CHF (systolic) *** CorVue *** No exam findings to suggest volume OL ***  5. Anxiety 6. PTSD ***    Disposition: ***     Current medicines are reviewed at length  with the patient today.  The patient did not have any concerns regarding medicines.  Norma Fredrickson, PA-C 05/04/2023 1:33 PM     [facility] [address] [place] [address] [phone] (office)  [phone] (fax)

## 2023-05-05 ENCOUNTER — Encounter: Payer: Self-pay | Admitting: Podiatry

## 2023-05-05 ENCOUNTER — Ambulatory Visit (INDEPENDENT_AMBULATORY_CARE_PROVIDER_SITE_OTHER): Payer: Medicaid Other | Admitting: Podiatry

## 2023-05-05 DIAGNOSIS — B351 Tinea unguium: Secondary | ICD-10-CM | POA: Diagnosis not present

## 2023-05-05 DIAGNOSIS — E1142 Type 2 diabetes mellitus with diabetic polyneuropathy: Secondary | ICD-10-CM

## 2023-05-05 DIAGNOSIS — M79674 Pain in right toe(s): Secondary | ICD-10-CM

## 2023-05-05 DIAGNOSIS — M79675 Pain in left toe(s): Secondary | ICD-10-CM | POA: Diagnosis not present

## 2023-05-05 NOTE — Telephone Encounter (Signed)
Patient has not tried any alternatives.

## 2023-05-06 ENCOUNTER — Telehealth: Payer: Self-pay | Admitting: Internal Medicine

## 2023-05-06 ENCOUNTER — Encounter: Payer: Self-pay | Admitting: Physician Assistant

## 2023-05-06 ENCOUNTER — Ambulatory Visit: Payer: Medicaid Other | Attending: Physician Assistant | Admitting: Physician Assistant

## 2023-05-06 VITALS — BP 118/79 | HR 75 | Ht 72.0 in | Wt 299.0 lb

## 2023-05-06 DIAGNOSIS — I472 Ventricular tachycardia, unspecified: Secondary | ICD-10-CM | POA: Diagnosis not present

## 2023-05-06 DIAGNOSIS — I428 Other cardiomyopathies: Secondary | ICD-10-CM | POA: Diagnosis not present

## 2023-05-06 DIAGNOSIS — I5023 Acute on chronic systolic (congestive) heart failure: Secondary | ICD-10-CM

## 2023-05-06 DIAGNOSIS — Z95810 Presence of automatic (implantable) cardiac defibrillator: Secondary | ICD-10-CM | POA: Diagnosis not present

## 2023-05-06 DIAGNOSIS — I4901 Ventricular fibrillation: Secondary | ICD-10-CM | POA: Diagnosis not present

## 2023-05-06 LAB — CUP PACEART INCLINIC DEVICE CHECK
Battery Remaining Longevity: 96 mo
Brady Statistic RA Percent Paced: 44 %
Brady Statistic RV Percent Paced: 0.63 %
Date Time Interrogation Session: 20241127172302
HighPow Impedance: 60.75 Ohm
Implantable Lead Connection Status: 753985
Implantable Lead Connection Status: 753985
Implantable Lead Implant Date: 20150717
Implantable Lead Implant Date: 20231207
Implantable Lead Location: 753859
Implantable Lead Location: 753860
Implantable Pulse Generator Implant Date: 20231207
Lead Channel Impedance Value: 325 Ohm
Lead Channel Impedance Value: 362.5 Ohm
Lead Channel Pacing Threshold Amplitude: 0.375 V
Lead Channel Pacing Threshold Pulse Width: 0.5 ms
Lead Channel Sensing Intrinsic Amplitude: 0.9 mV
Lead Channel Sensing Intrinsic Amplitude: 12 mV
Lead Channel Setting Pacing Amplitude: 1.375
Lead Channel Setting Pacing Amplitude: 2 V
Lead Channel Setting Pacing Pulse Width: 0.5 ms
Lead Channel Setting Sensing Sensitivity: 0.5 mV
Pulse Gen Serial Number: 810051574
Zone Setting Status: 755011

## 2023-05-06 NOTE — Telephone Encounter (Signed)
Cardiology called for samples for pt, advised they will be left at front desk nfn

## 2023-05-06 NOTE — Patient Instructions (Signed)
Medication Instructions:  Please take 25 mg of spironolactone daily for 4 days, then go back to your normal dose of  12.5 mg daily.   Please go to the office of Ames Dura, NP at [employer], [address] in [place]. There are samples of trelegy ellipta waiting for you at the front desk.    *If you need a refill on your cardiac medications before your next appointment, please call your pharmacy*   Lab Work: None.  If you have labs (blood work) drawn today and your tests are completely normal, you will receive your results only by: MyChart Message (if you have MyChart) OR A paper copy in the mail If you have any lab test that is abnormal or we need to change your treatment, we will call you to review the results.   Testing/Procedures: None.   Follow-Up: At [facility], you and your health needs are our priority.  As part of our continuing mission to provide you with exceptional heart care, we have created designated Provider Care Teams.  These Care Teams include your primary Cardiologist (physician) and Advanced Practice Providers (APPs -  Physician Assistants and Nurse Practitioners) who all work together to provide you with the care you need, when you need it.  We recommend signing up for the patient portal called "MyChart".  Sign up information is provided on this After Visit Summary.  MyChart is used to connect with patients for Virtual Visits (Telemedicine).  Patients are able to view lab/test results, encounter notes, upcoming appointments, etc.  Non-urgent messages can be sent to your provider as well.   To learn more about what you can do with MyChart, go to [email or web address].    Your next appointment:   6 month(s)  Provider:   Francis Dowse PA-C or Dr. Jeanie Cooks

## 2023-05-06 NOTE — Telephone Encounter (Signed)
Patient calling for Trelegy samples

## 2023-05-09 ENCOUNTER — Encounter: Payer: Self-pay | Admitting: Internal Medicine

## 2023-05-09 NOTE — Progress Notes (Signed)
  Subjective:  Patient ID: [name], male    DOB: 16-Nov-1965,  MRN: [id number]  57 y.o. male presents to clinic with  at risk foot care with history of diabetic neuropathy. Patient states he is being treated for DVT of the left arm. Chief Complaint  Patient presents with   Diabetes    PATIENT STATES HIS TOE NAILS ARE GROWING BUT OTHER THEN THAT EVERYTHING HAS BEEN OK, PATIENT A1C WAS 7 1/2 OR 9 , IT HAS BEEN A FEW WEEKS SINCE HE HAS SEEN HIS PCP     New problem(s): None   PCP is Marcine Matar, MD.  Allergies  Allergen Reactions   Bee Venom Anaphylaxis   Trulicity [Dulaglutide] Anaphylaxis and Diarrhea    Extra dose of Trulicity caused cardiac arrest. Patient experience onset of diarrhea with using this medication - cleared after med stopped.    Review of Systems: Negative except as noted in the HPI.   Objective:  [name] is a pleasant 57 y.o. male in NAD. AAO x 3.  Vascular Examination: Vascular status intact b/l with palpable pedal pulses. CFT immediate b/l. No edema. No pain with calf compression b/l. Skin temperature gradient WNL b/l.   Neurological Examination: Sensation grossly intact b/l with 10 gram monofilament. Vibratory sensation intact b/l. Pt has subjective symptoms of neuropathy.  Dermatological Examination: Pedal skin with normal turgor, texture and tone b/l. Toenails 1-5 b/l thick, discolored, elongated with subungual debris and pain on dorsal palpation. No hyperkeratotic lesions noted b/l.   Musculoskeletal Examination: Muscle strength 5/5 to b/l LE. Pes planus deformity noted bilateral LE.  Radiographs: None  Last A1c:      Latest Ref Rng & Units 04/03/2023    8:54 AM 02/13/2023    4:08 PM 12/12/2022   12:00 AM 09/18/2022    3:45 PM  Hemoglobin A1C  Hemoglobin-A1c 4.8 - 5.6 % 9.3  9.1  8.9     7.9      This result is from an external source.     Assessment:   1. Pain due to onychomycosis of toenails of both feet   2. Diabetic  polyneuropathy associated with type 2 diabetes mellitus (HCC)     Plan:  -Consent given for treatment as described below: -Examined patient. -Continue foot and shoe inspections daily. Monitor blood glucose per PCP/Endocrinologist's recommendations. -Continue supportive shoe gear daily. -Mycotic toenails 1-5 bilaterally were debrided in length and girth with sterile nail nippers and dremel without incident. -Patient/POA to call should there be question/concern in the interim.  Return in about 3 months (around 08/05/2023).  Freddie Breech, DPM      [facility] LOCATION: [address], [place] [address]                   Office [phone]   [facility] LOCATION: [address] [place], [place] [address] Office [phone]

## 2023-05-11 ENCOUNTER — Other Ambulatory Visit: Payer: Self-pay | Admitting: Internal Medicine

## 2023-05-11 ENCOUNTER — Telehealth: Payer: Self-pay | Admitting: Physical Medicine and Rehabilitation

## 2023-05-11 MED ORDER — CLOTRIMAZOLE 1 % EX OINT: TOPICAL_OINTMENT | CUTANEOUS | 0 refills | Status: DC

## 2023-05-11 MED ORDER — BD PEN NEEDLE SHORT U/F 31G X 8 MM MISC: 6 refills | Status: DC

## 2023-05-11 MED ORDER — SPIRIVA RESPIMAT 2.5 MCG/ACT IN AERS: 2.0000 | INHALATION_SPRAY | Freq: Every day | RESPIRATORY_TRACT | 11 refills | Status: DC

## 2023-05-11 MED ORDER — BUDESONIDE-FORMOTEROL FUMARATE 160-4.5 MCG/ACT IN AERO: 2.0000 | INHALATION_SPRAY | Freq: Two times a day (BID) | RESPIRATORY_TRACT | 12 refills | Status: DC

## 2023-05-11 NOTE — Telephone Encounter (Signed)
Pharmacy Patient Advocate Encounter  Received notification from [facility] that Prior Authorization for Trelegy has been DENIED.  See denial reason below. No denial letter attached in CMM. Will attach denial letter to Media tab once received.   PA #/Case ID/Reference #: Here are the policy requirements your request did not meet: Per your health plan's criteria, this drug is covered if you meet the following: One of the following: (1) You have failed two preferred drugs as confirmed by claims history or submission of medical records. The preferred drugs: Brand Advair Diskus, brand Symbicort 160-4.40mcg, Dulera, Anoro Ellipta, Atrovent HFA, Combivent Respimat, Incruse Ellipta, Spiriva Handihaler, Spiriva Respimat. (2) You cannot use two preferred drugs (please specify contraindication or intolerance). The information provided does not show that you meet the criteria listed above.

## 2023-05-11 NOTE — Addendum Note (Signed)
Addended by: Durel Salts on: 05/11/2023 04:04 PM   Modules accepted: Orders

## 2023-05-11 NOTE — Telephone Encounter (Signed)
Last back injection 03/2023. Pt requesting an appt for back injection with Dr Alvester Morin. Pt phone number is [phone].

## 2023-05-11 NOTE — Telephone Encounter (Signed)
I have sent symbicort and spiriva to his pharmacy as preferred medications to start.

## 2023-05-12 ENCOUNTER — Other Ambulatory Visit: Payer: Self-pay | Admitting: Physical Medicine and Rehabilitation

## 2023-05-12 DIAGNOSIS — G8929 Other chronic pain: Secondary | ICD-10-CM

## 2023-05-12 NOTE — Telephone Encounter (Signed)
Patient is aware of below message/recommendations and voiced his understanding.  Nothing further needed.  

## 2023-05-12 NOTE — Progress Notes (Signed)
ADVANCED HEART FAILURE CLINIC NOTE  Referring Physician: Marcine Matar, MD  Primary Care: Marcine Matar, MD Primary Cardiologist: Dr. Jens Som EP: Dr. Lalla Brothers  HPI: [name] is a 57 y.o. male with HFrEF (dx 2015) w/ LHC demonstrating normal coronary anatomy s/p primary prevention ICD, CKDIIIB & T2DM. He has been followed outpatient by Dr. Jens Som. In 2017 he had a CPX with w/ moderate functional impairment, peak VO2 of 15, VE/VCO2 of 35. Outpatient he has been on a stable regimen of Entresto/coreg/farxiga. From a functional standpoint, [name] reports running 2-3 miles daily until he was diagnosed with 'long covid' 7 months ago. Since that time he has seen a sharp decline in exercise capacity. In November 2023, he was admitted for VT believed to be secondary to significant electrolyte derrangements from and underlying right gluteal abscess now s/p IV antibiotics. Throughout his admission he had recurrent device shocks for VT. He underwent RHC/LHC that demonstrated normal coronary anatomy with RHC showing normal cardiac output and low filling pressures. He subsequently underwent placement of right atrial lead with improvement in ectopy after atrial pacing. TTE during admission with LVEF of 30-35%.   Interval history:  He reports feeling very volume overloaded today with 10lb weight gain. He has 2+ edema; reports becoming very short of breath with minimal activity due to his underlying COPD. Unfortunately, he is also not taking his inhalers currently due to side effects associated with them (uncomfortable sensation in his chest).   Activity level/exercise tolerance: NYHA III Orthopnea:  Sleeps on 3 Paroxysmal noctural dyspnea:  Yes Chest pain/pressure:  No Orthostatic lightheadedness:  No Palpitations:  No Lower extremity edema:  1-2+ Presyncope/syncope:  No Cough:  No  Past Medical History:  Diagnosis Date   AICD (automatic cardioverter/defibrillator) present     Chronic bronchitis (HCC)    "get it most q yr" (12/23/2013)   Chronic systolic (congestive) heart failure (HCC)    Fracture of left humerus    a. 07/2013.   GERD (gastroesophageal reflux disease)    not at present time   Heart murmur    "born w/it"    History of renal calculi    Hypertension    Kidney stones    NICM (nonischemic cardiomyopathy) (HCC)    a. 07/2013 Echo: EF 20-25%, diff HK, Gr2 DD, mild MR, mod dil LA/RA. 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     albuterol (VENTOLIN HFA) 108 (90 Base) MCG/ACT inhaler Inhale 2 puffs into the lungs every 6 (six) hours as needed for wheezing or shortness of breath. 18 g 2   amiodarone (PACERONE) 200 MG tablet Take 1 tablet (200 mg total) by mouth daily. 90 tablet 3   apixaban (ELIQUIS) 5 MG TABS tablet Take 2 tablets (10 mg total) by mouth 2 (two) times daily for 14 days, THEN 1 tablet (5 mg total) 2 (two) times daily. 116 tablet 0   atorvastatin (LIPITOR) 10 MG tablet Take 1 tablet (10 mg total) by mouth daily. 90 tablet 1   Blood  Glucose Monitoring Suppl (ACCU-CHEK GUIDE) w/Device KIT UAD 1 kit 0   Blood Glucose Monitoring Suppl DEVI 1 each by Does not apply route in the morning, at noon, and at bedtime. May substitute to any manufacturer covered by patient's insurance. 1 each 0   budesonide-formoterol (SYMBICORT) 160-4.5 MCG/ACT inhaler Inhale 2 puffs into the lungs in the morning and at bedtime. 1 each 12   cetirizine (EQ ALLERGY RELIEF, CETIRIZINE,) 10 MG tablet Take 1 tablet (10 mg total) by mouth daily. (Patient  taking differently: Take 10 mg by mouth in the morning.) 90 tablet 1   cholecalciferol (VITAMIN D3) 25 MCG (1000 UNIT) tablet Take 1,000 Units by mouth in the morning.     clonazePAM (KLONOPIN) 0.5 MG tablet Take 1 tablet (0.5 mg total) by mouth 2 (two) times daily as needed for anxiety. 60 tablet 1   Clotrimazole 1 % OINT Apply to the affected area twice a day 56.7 g 0   Continuous Glucose Receiver (FREESTYLE LIBRE 2 READER) DEVI USE AS DIRECTED 1 each 0   Continuous Glucose Sensor (FREESTYLE LIBRE 2 SENSOR) MISC Use to check blood sugar TID. Change sensor once every 14 days. E11.65 2 each 11   EPINEPHrine 0.3 mg/0.3 mL IJ SOAJ injection Inject 0.3 mg into the muscle as needed. 1 each 1   famotidine (PEPCID) 20 MG tablet TAKE 1 TABLET BY MOUTH 1 TO 2 TIMES DAILY AS NEEDED (Patient taking differently: Take 20 mg by mouth See admin instructions. TAKE 1 TABLET BY MOUTH 1 TO 2 TIMES DAILY AS NEEDED) 180 tablet 0   fluticasone (FLONASE) 50 MCG/ACT nasal spray Use 1-2 sprays in each nostril once a day as needed for nasal congestion. 16 g 5   gabapentin (NEURONTIN) 300 MG capsule Take 1 capsule by mouth at bedtime 90 capsule 1   glucose blood (ACCU-CHEK GUIDE) test strip Use as instructed 100 each 12   glucose blood (FREESTYLE LITE) test strip Use as instructed.  Please make sure this is compatible with [facility] 2 reader. 100 each 12   insulin aspart (NOVOLOG) 100 UNIT/ML injection Inject 24 Units into the skin 3 (three) times daily with meals.     insulin glargine (LANTUS SOLOSTAR) 100 UNIT/ML Solostar Pen Inject 42 Units into the skin daily.     Insulin Pen Needle (B-D ULTRAFINE III SHORT PEN) 31G X 8 MM MISC USE AS DIRECTED 100 each 6   Lancet Devices (ACCU-CHEK SOFTCLIX) lancets 1 each by Other route 3 (three) times daily. 1 each 0   loperamide (IMODIUM) 2 MG capsule Take 1 capsule (2 mg total) by mouth as needed for diarrhea or loose stools. 12 capsule 0   methocarbamol (ROBAXIN) 500 MG tablet Take 1  tablet (500 mg total) by mouth daily. (Patient taking differently: Take 500 mg by mouth at bedtime.) 90 tablet 1   metoprolol succinate (TOPROL-XL) 50 MG 24 hr tablet Take 1 tablet (50 mg total) by mouth daily. (Patient taking differently: Take 50 mg by mouth in the morning.) 90 tablet 3   nystatin (MYCOSTATIN/NYSTOP) powder Apply topically 3 (three) times daily. 15 g 0   sacubitril-valsartan (ENTRESTO) 24-26 MG Take 1 tablet by mouth 2 (two) times daily. 60 tablet 5   sertraline (ZOLOFT) 50 MG tablet Take 1 tablet (50 mg total) by mouth in the morning. 30 tablet 2   spironolactone (ALDACTONE) 25 MG tablet Take 0.5 tablets (12.5 mg total) by mouth daily. (Patient taking differently: Take 12.5 mg by mouth in the  morning.) 30 tablet 5   tamsulosin (FLOMAX) 0.4 MG CAPS capsule Take 2 capsules (0.8 mg total) by mouth daily. (Patient taking differently: Take 0.8 mg by mouth in the morning.) 180 capsule 1   Tiotropium Bromide Monohydrate (SPIRIVA RESPIMAT) 2.5 MCG/ACT AERS Inhale 2 puffs into the lungs daily. 1 each 11   torsemide (DEMADEX) 20 MG tablet Take 4 tablets (80 mg total) by mouth daily. (Patient taking differently: Take 80 mg by mouth in the morning.) 120 tablet 3   No current facility-administered medications for this visit.    Allergies  Allergen Reactions   Bee Venom Anaphylaxis   Trulicity [Dulaglutide] Anaphylaxis and Diarrhea    Extra dose of Trulicity caused cardiac arrest. Patient experience onset of diarrhea with using this medication - cleared after med stopped.      Social History   Socioeconomic History   Marital status: Single    Spouse name: Not on file   Number of children: Not on file   Years of education: Not on file   Highest education level: Not on file  Occupational History   Occupation: Disabled  Tobacco Use   Smoking status: Former    Current packs/day: 0.00    Average packs/day: 1 pack/day for 28.0 years (28.0 ttl pk-yrs)    Types: Cigarettes    Start  date: 06/03/1986    Quit date: 06/03/2014    Years since quitting: 8.9   Smokeless tobacco: Never  Vaping Use   Vaping status: Never Used  Substance and Sexual Activity   Alcohol use: Yes   Drug use: Yes    Types: Marijuana    Comment: occasionally   Sexual activity: Yes  Other Topics Concern   Not on file  Social History Narrative   Pt lives with his brother    Social Determinants of Health   Financial Resource Strain: High Risk (03/24/2023)   Overall Financial Resource Strain (CARDIA)    Difficulty of Paying Living Expenses: Hard  Food Insecurity: No Food Insecurity (04/24/2023)   Hunger Vital Sign    Worried About Running Out of Food in the Last Year: Never true    Ran Out of Food in the Last Year: Never true  Transportation Needs: No Transportation Needs (04/24/2023)   PRAPARE - Administrator, Civil Service (Medical): No    Lack of Transportation (Non-Medical): No  Physical Activity: Not on file  Stress: Not on file  Social Connections: Moderately Integrated (03/12/2021)   Social Connection and Isolation Panel [NHANES]    Frequency of Communication with Friends and Family: More than three times a week    Frequency of Social Gatherings with Friends and Family: More than three times a week    Attends Religious Services: More than 4 times per year    Active Member of [employer] or Organizations: Yes    Attends Banker Meetings: Never    Marital Status: Never married  Intimate Partner Violence: Not At Risk (04/24/2023)   Humiliation, Afraid, Rape, and Kick questionnaire    Fear of Current or Ex-Partner: No    Emotionally Abused: No    Physically Abused: No    Sexually Abused: No      Family History  Problem Relation Age of Onset   Diabetes Father    Arthritis Father    Hypertension Father    Diabetes Mother    Arthritis Mother    Hypertension Mother    Hypertension Brother     PHYSICAL EXAM: There  were no vitals filed for this  visit.  GENERAL: Well nourished, well developed, and in no apparent distress at rest.  HEENT: Negative for arcus senilis or xanthelasma. There is no scleral icterus.  The mucous membranes are pink and moist.   NECK: Supple, No masses. Normal carotid upstrokes without bruits. No masses or thyromegaly.    CHEST: There are no chest wall deformities. There is no chest wall tenderness. Respirations are unlabored.  Lungs- CTA B/L CARDIAC:  unable to visualize due to body habitus         Normal rate with regular rhythm. No murmurs, rubs or gallops.  Pulses are 2+ and symmetrical in upper and lower extremities. 1-2+ pitting edema.  ABDOMEN: Soft, non-tender, non-distended. There are no masses or hepatomegaly. There are normal bowel sounds.  EXTREMITIES: Warm and well perfused with no cyanosis, clubbing.  LYMPHATIC: No axillary or supraclavicular lymphadenopathy.  NEUROLOGIC: Patient is oriented x3 with no focal or lateralizing neurologic deficits.  PSYCH: Patients affect is appropriate, there is no evidence of anxiety or depression.  SKIN: Warm and dry; no lesions or wounds.     DATA REVIEW  ECG:NSR  ECHO: 08/22/2022: LVEF 35%, normal RV function 05/06/2022: LVEF 30-35%, normal RV function.  02/16/19: LVEF 45-50%, normal RV function.   CATH: 05/12/22: HEMODYNAMICS: RA:                  1 mmHg (mean) RV:                  22/1-3 mmHg PA:                  23/6 mmHg (12 mean) PCWP:            4 mmHg (mean)                                      Estimated Fick CO/CI   7 L/min, 2.9 L/min/m2                                                 TPG                 8  mmHg                                              PVR                 ~1 Wood Units  PAPi                >5       IMPRESSION: Low pre and post capillary filling pressures.  Normal cardiac output/cardiac index.  Normal PVR & PA mean No obstructive CAD, left dominant w/ large Lcx supplying multiple large marginals.    ASSESSMENT &  PLAN:  Heart failure with reduced EF, Stage C, NYHA IIB-III Etiology of ZO:[id number] as demonstrated by coronary angiography; no family history. If he continues to have recurrent VT will consider evaluation for sarcoidosis. Device interrogation w/o arrhythmias NYHA class / AHA Stage: III; REDS 40% today.  Volume status & Diuretics:  currently taking lasix 80mg  daily;  torsemide 80mg  daily for the next 4-5 days. Repeat labs today.  Vasodilators:Entresto 24/26mg  BID Beta-Blocker: Continue Toprol to 50mg  MRA: Continue spironolactone 12.5 mg daily.  No other medication changes. Cardiometabolic:continue farxiga to 10mg  daily Devices therapies & Valvulopathies:dcICD Advanced therapies: CPX in 2017 peak VO2 of 15 AND ve/vco2 of 35 but with submaximal effort; consistent with moderate HF limitations. Advanced therapies at this time limited by CKD and social barriers.   2. VT -No further episodes on device interrogation.  3. T2DM - Farxiga, insulin  - Followed by his PCP  4. CKD3B - Continue farxiga and Entresto - repeat labs pending  5. Panic attacks -Will follow-up with psychiatry.  6. Gluteal cellulitis  -Following with general surgery  7.  Shortness of breath - Much of his dyspnea is secondary to underlying pulmonary disease; he is volume overloaded today though. REDs increased to 40%. Will start torsemide 80mg  daily although I feel that he will likely want to be admitted to [facility] for inpatient diuresis.   Aditya [name] Advanced Heart Failure Mechanical Circulatory Support

## 2023-05-13 ENCOUNTER — Ambulatory Visit (HOSPITAL_COMMUNITY)
Admission: RE | Admit: 2023-05-13 | Discharge: 2023-05-13 | Disposition: A | Discharge status: Home / Self Care | Payer: Medicaid Other | Source: Ambulatory Visit | Attending: Family Medicine | Admitting: Family Medicine

## 2023-05-13 ENCOUNTER — Encounter (HOSPITAL_COMMUNITY): Payer: Self-pay

## 2023-05-13 VITALS — BP 126/82 | HR 78 | Wt 297.6 lb

## 2023-05-13 DIAGNOSIS — F410 Panic disorder [episodic paroxysmal anxiety] without agoraphobia: Secondary | ICD-10-CM | POA: Insufficient documentation

## 2023-05-13 DIAGNOSIS — F419 Anxiety disorder, unspecified: Secondary | ICD-10-CM

## 2023-05-13 DIAGNOSIS — I82629 Acute embolism and thrombosis of deep veins of unspecified upper extremity: Secondary | ICD-10-CM | POA: Diagnosis not present

## 2023-05-13 DIAGNOSIS — N183 Chronic kidney disease, stage 3 unspecified: Secondary | ICD-10-CM

## 2023-05-13 DIAGNOSIS — I130 Hypertensive heart and chronic kidney disease with heart failure and stage 1 through stage 4 chronic kidney disease, or unspecified chronic kidney disease: Secondary | ICD-10-CM | POA: Insufficient documentation

## 2023-05-13 DIAGNOSIS — J984 Other disorders of lung: Secondary | ICD-10-CM | POA: Diagnosis not present

## 2023-05-13 DIAGNOSIS — I5023 Acute on chronic systolic (congestive) heart failure: Secondary | ICD-10-CM | POA: Diagnosis not present

## 2023-05-13 DIAGNOSIS — L0390 Cellulitis, unspecified: Secondary | ICD-10-CM | POA: Diagnosis not present

## 2023-05-13 DIAGNOSIS — N1832 Chronic kidney disease, stage 3b: Secondary | ICD-10-CM | POA: Diagnosis not present

## 2023-05-13 DIAGNOSIS — I5022 Chronic systolic (congestive) heart failure: Secondary | ICD-10-CM | POA: Diagnosis not present

## 2023-05-13 DIAGNOSIS — L03317 Cellulitis of buttock: Secondary | ICD-10-CM | POA: Insufficient documentation

## 2023-05-13 DIAGNOSIS — R0683 Snoring: Secondary | ICD-10-CM

## 2023-05-13 DIAGNOSIS — E1122 Type 2 diabetes mellitus with diabetic chronic kidney disease: Secondary | ICD-10-CM | POA: Insufficient documentation

## 2023-05-13 DIAGNOSIS — H40023 Open angle with borderline findings, high risk, bilateral: Secondary | ICD-10-CM | POA: Diagnosis not present

## 2023-05-13 DIAGNOSIS — E119 Type 2 diabetes mellitus without complications: Secondary | ICD-10-CM | POA: Diagnosis not present

## 2023-05-13 DIAGNOSIS — Z79899 Other long term (current) drug therapy: Secondary | ICD-10-CM | POA: Diagnosis not present

## 2023-05-13 DIAGNOSIS — Z7901 Long term (current) use of anticoagulants: Secondary | ICD-10-CM | POA: Diagnosis not present

## 2023-05-13 DIAGNOSIS — I472 Ventricular tachycardia, unspecified: Secondary | ICD-10-CM

## 2023-05-13 DIAGNOSIS — Z794 Long term (current) use of insulin: Secondary | ICD-10-CM

## 2023-05-13 LAB — CBC
HCT: 41.5 % (ref 39.0–52.0)
Hemoglobin: 13.4 g/dL (ref 13.0–17.0)
MCH: 30 pg (ref 26.0–34.0)
MCHC: 32.3 g/dL (ref 30.0–36.0)
MCV: 93 fL (ref 80.0–100.0)
Platelets: 290 10*3/uL (ref 150–400)
RBC: 4.46 MIL/uL (ref 4.22–5.81)
RDW: 14.4 % (ref 11.5–15.5)
WBC: 5.9 10*3/uL (ref 4.0–10.5)
nRBC: 0.3 % — ABNORMAL HIGH (ref 0.0–0.2)

## 2023-05-13 LAB — COMPREHENSIVE METABOLIC PANEL
ALT: 18 U/L (ref 0–44)
AST: 16 U/L (ref 15–41)
Albumin: 3.9 g/dL (ref 3.5–5.0)
Alkaline Phosphatase: 48 U/L (ref 38–126)
Anion gap: 12 (ref 5–15)
BUN: 23 mg/dL — ABNORMAL HIGH (ref 6–20)
CO2: 24 mmol/L (ref 22–32)
Calcium: 9.7 mg/dL (ref 8.9–10.3)
Chloride: 102 mmol/L (ref 98–111)
Creatinine, Ser: 2.75 mg/dL — ABNORMAL HIGH (ref 0.61–1.24)
GFR, Estimated: 26 mL/min — ABNORMAL LOW (ref >60)
Glucose, Bld: 205 mg/dL — ABNORMAL HIGH (ref 70–99)
Potassium: 3.9 mmol/L (ref 3.5–5.1)
Sodium: 138 mmol/L (ref 135–145)
Total Bilirubin: 0.6 mg/dL (ref <1.2)
Total Protein: 7.2 g/dL (ref 6.5–8.1)

## 2023-05-13 LAB — BRAIN NATRIURETIC PEPTIDE: B Natriuretic Peptide: 23.9 pg/mL (ref 0.0–100.0)

## 2023-05-13 LAB — MAGNESIUM: Magnesium: 1.9 mg/dL (ref 1.7–2.4)

## 2023-05-13 MED ORDER — TORSEMIDE 20 MG PO TABS: ORAL_TABLET | ORAL | 3 refills | Status: DC

## 2023-05-13 MED ORDER — POTASSIUM CHLORIDE CRYS ER 20 MEQ PO TBCR: 40.0000 meq | EXTENDED_RELEASE_TABLET | Freq: Every day | ORAL | 3 refills | Status: DC

## 2023-05-13 MED ORDER — FUROSCIX 80 MG/10ML ~~LOC~~ CTKT: 80.0000 mg | CARTRIDGE | SUBCUTANEOUS | Status: DC

## 2023-05-13 NOTE — Progress Notes (Signed)
ReDS Vest / Clip - 05/13/23 1400       ReDS Vest / Clip   Station Marker D    Ruler Value 39    ReDS Value Range High volume overload    ReDS Actual Value 42

## 2023-05-13 NOTE — Patient Instructions (Signed)
Medication Changes:  STOP TORSEMIDE FOR THE NEXT 3 DAYS   Your provider has order Furoscix for you. This is an on-body infuser that gives you a dose of Furosemide.   It will be shipped to your home   [employer] will call you to discuss before shipping so, PLEASE answer unknown calls  For questions regarding the device call [employer] at [phone]  Ensure you write down the time you start your infusion so that if there is a problem you will know how long the infusion lasted  Use Furoscix only AS DIRECTED by our office  Dosing Directions:   Day 1= THURSDAY TAKE 1 DOSE WITH OF POTASSIUM   Day 2= FRIDAY TAKE 1 DOSE WITH OF POTASSIUM   Day 3= SATURDAY TAKE 1 DOSE WITH OF POTASSIUM   ON SUNDAY START TORSEMIDE 80MG  IN THE MORNING AND 40MG  IN THE EVENING- ALSO TAKE OF POTASSIUM DAILY   Lab Work:  Labs done today, your results will be available in MyChart, we will contact you for abnormal readings.  Follow-Up in: AS SCHEDULED NEXT WEEK WITH DR. Gasper Lloyd   At the [facility], you and your health needs are our priority. We have a designated team specialized in the treatment of Heart Failure. This Care Team includes your primary Heart Failure Specialized Cardiologist (physician), Advanced Practice Providers (APPs- Physician Assistants and Nurse Practitioners), and Pharmacist who all work together to provide you with the care you need, when you need it.   You may see any of the following providers on your designated Care Team at your next follow up:  Dr. Arvilla Meres Dr. Marca Ancona Dr. Dorthula Nettles Dr. Theresia Bough Tonye Becket, NP Robbie Lis, [place] [facility] [place], [place] Brynda Peon, NP [place] Lee, NP Karle Plumber, PharmD   Please be sure to bring in all your medications bottles to every appointment.   Need to Contact us:  If you have any questions or concerns before your next appointment please  send [place] a message through [place] or call our office at [phone].    TO LEAVE A MESSAGE FOR THE NURSE SELECT OPTION 2, PLEASE LEAVE A MESSAGE INCLUDING: YOUR NAME DATE OF BIRTH CALL BACK NUMBER REASON FOR CALL**this is important as we prioritize the call backs  YOU WILL RECEIVE A CALL BACK THE SAME DAY AS LONG AS YOU CALL BEFORE 4:00 PM

## 2023-05-20 ENCOUNTER — Ambulatory Visit (INDEPENDENT_AMBULATORY_CARE_PROVIDER_SITE_OTHER): Payer: Medicaid Other

## 2023-05-20 ENCOUNTER — Telehealth: Payer: Self-pay | Admitting: Internal Medicine

## 2023-05-20 DIAGNOSIS — I472 Ventricular tachycardia, unspecified: Secondary | ICD-10-CM | POA: Diagnosis not present

## 2023-05-20 DIAGNOSIS — J441 Chronic obstructive pulmonary disease with (acute) exacerbation: Secondary | ICD-10-CM

## 2023-05-20 LAB — CUP PACEART REMOTE DEVICE CHECK
Battery Remaining Longevity: 91 mo
Battery Remaining Percentage: 85 %
Battery Voltage: 3.01 V
Brady Statistic AP VP Percent: 1 %
Brady Statistic AP VS Percent: 16 %
Brady Statistic AS VP Percent: 1 %
Brady Statistic AS VS Percent: 83 %
Brady Statistic RA Percent Paced: 16 %
Brady Statistic RV Percent Paced: 1 %
Date Time Interrogation Session: 20241211020028
HighPow Impedance: 69 Ohm
Implantable Lead Connection Status: 753985
Implantable Lead Connection Status: 753985
Implantable Lead Implant Date: 20150717
Implantable Lead Implant Date: 20231207
Implantable Lead Location: 753859
Implantable Lead Location: 753860
Implantable Pulse Generator Implant Date: 20231207
Lead Channel Impedance Value: 360 Ohm
Lead Channel Impedance Value: 400 Ohm
Lead Channel Pacing Threshold Amplitude: 0.375 V
Lead Channel Pacing Threshold Amplitude: 0.75 V
Lead Channel Pacing Threshold Pulse Width: 0.5 ms
Lead Channel Pacing Threshold Pulse Width: 0.5 ms
Lead Channel Sensing Intrinsic Amplitude: 12 mV
Lead Channel Sensing Intrinsic Amplitude: 2.5 mV
Lead Channel Setting Pacing Amplitude: 1.375
Lead Channel Setting Pacing Amplitude: 2 V
Lead Channel Setting Pacing Pulse Width: 0.5 ms
Lead Channel Setting Sensing Sensitivity: 0.5 mV
Pulse Gen Serial Number: 810051574
Zone Setting Status: 755011

## 2023-05-20 MED ORDER — AMOXICILLIN-POT CLAVULANATE 875-125 MG PO TABS: 1.0000 | ORAL_TABLET | Freq: Two times a day (BID) | ORAL | 0 refills | Status: DC

## 2023-05-20 MED ORDER — PREDNISONE 10 MG PO TABS: ORAL_TABLET | ORAL | 0 refills | Status: AC

## 2023-05-20 NOTE — Telephone Encounter (Signed)
Called and spoke to patient.  C/o prod cough with yellow to brown sputum, wheezing, diarrhea and nasal congested x3d.  SOB is baseline.  Denied f/c/s or additional sx.   No recent covid test. I have recommended that he test for covid. He has been using sample of trelegy once daily. He has not used albuterol.   Dr. Francine Graven, please advise. Dr. Celine Mans is unavailable.

## 2023-05-20 NOTE — Telephone Encounter (Signed)
Patient is aware of below message/recommendations and voiced his understanding.  Nothing further needed.  

## 2023-05-20 NOTE — Telephone Encounter (Signed)
Extended prednisone taper and augmentin 1 tab twice daily sent to his pharmacy.

## 2023-05-20 NOTE — Telephone Encounter (Signed)
Pt is very congested and coughing up brown/yellow phlegm. Wonders if we can call in RX for relief. His # is [phone]  [name] on [address] (There are certain antibx he can not take  due to his Eliquis)

## 2023-05-20 NOTE — Progress Notes (Incomplete)
ADVANCED HEART FAILURE CLINIC NOTE  Referring Physician: Marcine Matar, MD  Primary Care: Marcine Matar, MD Primary Cardiologist: Dr. Jens Som EP: Dr. Lalla Brothers  HPI: [name] is a 57 y.o. male with HFrEF (dx 2015) w/ LHC demonstrating normal coronary anatomy s/p primary prevention ICD, CKDIIIB & T2DM. He has been followed outpatient by Dr. Jens Som. In 2017 he had a CPX with w/ moderate functional impairment, peak VO2 of 15, VE/VCO2 of 35. Outpatient he has been on a stable regimen of Entresto/coreg/farxiga. From a functional standpoint, Mr. Hoeppner reports running 2-3 miles daily until he was diagnosed with 'long covid' 7 months ago. Since that time he has seen a sharp decline in exercise capacity. In November 2023, he was admitted for VT believed to be secondary to significant electrolyte derrangements from and underlying right gluteal abscess now s/p IV antibiotics. Throughout his admission he had recurrent device shocks for VT. He underwent RHC/LHC that demonstrated normal coronary anatomy with RHC showing normal cardiac output and low filling pressures. He subsequently underwent placement of right atrial lead with improvement in ectopy after atrial pacing. TTE during admission with LVEF of 30-35%.   Interval history:  He reports feeling very volume overloaded today with 10lb weight gain. He has 2+ edema; reports becoming very short of breath with minimal activity due to his underlying COPD. Unfortunately, he is also not taking his inhalers currently due to side effects associated with them (uncomfortable sensation in his chest).   Activity level/exercise tolerance: NYHA III Orthopnea:  Sleeps on 3 Paroxysmal noctural dyspnea:  Yes Chest pain/pressure:  No Orthostatic lightheadedness:  No Palpitations:  No Lower extremity edema:  1-2+ Presyncope/syncope:  No Cough:  No  Past Medical History:  Diagnosis Date   AICD (automatic cardioverter/defibrillator) present     Chronic bronchitis (HCC)    "get it most q yr" (12/23/2013)   Chronic systolic (congestive) heart failure (HCC)    Fracture of left humerus    a. 07/2013.   GERD (gastroesophageal reflux disease)    not at present time   Heart murmur    "born w/it"    History of renal calculi    Hypertension    Kidney stones    NICM (nonischemic cardiomyopathy) (HCC)    a. 07/2013 Echo: EF 20-25%, diff HK, Gr2 DD, mild MR, mod dil LA/RA. 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     albuterol (VENTOLIN HFA) 108 (90 Base) MCG/ACT inhaler Inhale 2 puffs into the lungs every 6 (six) hours as needed for wheezing or shortness of breath. 18 g 2   amiodarone (PACERONE) 200 MG tablet Take 1 tablet (200 mg total) by mouth daily. 90 tablet 3   amoxicillin-clavulanate (AUGMENTIN) 875-125 MG tablet Take 1 tablet by mouth 2 (two) times daily. 14 tablet 0   apixaban (ELIQUIS) 5 MG TABS tablet Take 2 tablets (10 mg total) by mouth 2 (two) times daily for 14 days, THEN 1 tablet (5 mg total) 2 (two) times daily. 116 tablet 0   atorvastatin (  LIPITOR) 10 MG tablet Take 1 tablet (10 mg total) by mouth daily. 90 tablet 1   Blood Glucose Monitoring Suppl (ACCU-CHEK GUIDE) w/Device KIT UAD 1 kit 0   Blood Glucose Monitoring Suppl DEVI 1 each by Does not apply route in the morning, at noon, and at bedtime. May substitute to any manufacturer covered by patient's insurance. 1 each 0   budesonide-formoterol (SYMBICORT) 160-4.5 MCG/ACT inhaler Inhale 2 puffs into the lungs in the morning and at bedtime. 1 each 12    cetirizine (EQ ALLERGY RELIEF, CETIRIZINE,) 10 MG tablet Take 1 tablet (10 mg total) by mouth daily. (Patient taking differently: Take 10 mg by mouth in the morning.) 90 tablet 1   cholecalciferol (VITAMIN D3) 25 MCG (1000 UNIT) tablet Take 1,000 Units by mouth in the morning.     clonazePAM (KLONOPIN) 0.5 MG tablet Take 1 tablet (0.5 mg total) by mouth 2 (two) times daily as needed for anxiety. 60 tablet 1   Clotrimazole 1 % OINT Apply to the affected area twice a day 56.7 g 0   Continuous Glucose Receiver (FREESTYLE LIBRE 2 READER) DEVI USE AS DIRECTED 1 each 0   Continuous Glucose Sensor (FREESTYLE LIBRE 2 SENSOR) MISC Use to check blood sugar TID. Change sensor once every 14 days. E11.65 2 each 11   EPINEPHrine 0.3 mg/0.3 mL IJ SOAJ injection Inject 0.3 mg into the muscle as needed. 1 each 1   famotidine (PEPCID) 20 MG tablet TAKE 1 TABLET BY MOUTH 1 TO 2 TIMES DAILY AS NEEDED (Patient taking differently: Take 20 mg by mouth See admin instructions. TAKE 1 TABLET BY MOUTH 1 TO 2 TIMES DAILY AS NEEDED) 180 tablet 0   fluticasone (FLONASE) 50 MCG/ACT nasal spray Use 1-2 sprays in each nostril once a day as needed for nasal congestion. 16 g 5   Furosemide (FUROSCIX) 80 MG/10ML CTKT Inject 80 mg into the skin as directed.     gabapentin (NEURONTIN) 300 MG capsule Take 1 capsule by mouth at bedtime 90 capsule 1   glucose blood (ACCU-CHEK GUIDE) test strip Use as instructed 100 each 12   glucose blood (FREESTYLE LITE) test strip Use as instructed.  Please make sure this is compatible with [facility] 2 reader. 100 each 12   insulin aspart (NOVOLOG) 100 UNIT/ML injection Inject 24 Units into the skin 3 (three) times daily with meals.     insulin glargine (LANTUS SOLOSTAR) 100 UNIT/ML Solostar Pen Inject 42 Units into the skin daily. (Patient taking differently: Inject 36 Units into the skin daily.)     Insulin Pen Needle (B-D ULTRAFINE III SHORT PEN) 31G X 8 MM MISC USE AS DIRECTED 100 each 6   Lancet  Devices (ACCU-CHEK SOFTCLIX) lancets 1 each by Other route 3 (three) times daily. 1 each 0   loperamide (IMODIUM) 2 MG capsule Take 1 capsule (2 mg total) by mouth as needed for diarrhea or loose stools. 12 capsule 0   methocarbamol (ROBAXIN) 500 MG tablet Take 1 tablet (500 mg total) by mouth daily. (Patient taking differently: Take 500 mg by mouth at bedtime.) 90 tablet 1   metoprolol succinate (TOPROL-XL) 50 MG 24 hr tablet Take 1 tablet (50 mg total) by mouth daily. (Patient taking differently: Take 50 mg by mouth in the morning.) 90 tablet 3   nystatin (MYCOSTATIN/NYSTOP) powder Apply topically 3 (three) times daily. 15 g 0   potassium chloride SA (KLOR-CON M) 20 MEQ tablet Take 2 tablets (40 mEq total) by mouth daily.  60 tablet 3   predniSONE (DELTASONE) 10 MG tablet Take 4 tablets (40 mg total) by mouth daily with breakfast for 3 days, THEN 3 tablets (30 mg total) daily with breakfast for 3 days, THEN 2 tablets (20 mg total) daily with breakfast for 3 days, THEN 1 tablet (10 mg total) daily with breakfast for 3 days. 30 tablet 0   sacubitril-valsartan (ENTRESTO) 24-26 MG Take 1 tablet by mouth 2 (two) times daily. 60 tablet 5   sertraline (ZOLOFT) 50 MG tablet Take 1 tablet (50 mg total) by mouth in the morning. 30 tablet 2   spironolactone (ALDACTONE) 25 MG tablet Take 0.5 tablets (12.5 mg total) by mouth daily. (Patient taking differently: Take 12.5 mg by mouth in the morning.) 30 tablet 5   tamsulosin (FLOMAX) 0.4 MG CAPS capsule Take 2 capsules (0.8 mg total) by mouth daily. (Patient taking differently: Take 0.8 mg by mouth in the morning.) 180 capsule 1   Tiotropium Bromide Monohydrate (SPIRIVA RESPIMAT) 2.5 MCG/ACT AERS Inhale 2 puffs into the lungs daily. 1 each 11   torsemide (DEMADEX) 20 MG tablet TAKE 80MG  (4) TABLETS IN THE MORNING, AND 40MG  (2) TABLETS IN THE EVENING STARTING SUNDAY 12/8 180 tablet 3   No current facility-administered medications for this visit.    Allergies   Allergen Reactions   Bee Venom Anaphylaxis   Trulicity [Dulaglutide] Anaphylaxis and Diarrhea    Extra dose of Trulicity caused cardiac arrest. Patient experience onset of diarrhea with using this medication - cleared after med stopped.      Social History   Socioeconomic History   Marital status: Single    Spouse name: Not on file   Number of children: Not on file   Years of education: Not on file   Highest education level: Not on file  Occupational History   Occupation: Disabled  Tobacco Use   Smoking status: Former    Current packs/day: 0.00    Average packs/day: 1 pack/day for 28.0 years (28.0 ttl pk-yrs)    Types: Cigarettes    Start date: 06/03/1986    Quit date: 06/03/2014    Years since quitting: 8.9   Smokeless tobacco: Never  Vaping Use   Vaping status: Never Used  Substance and Sexual Activity   Alcohol use: Yes   Drug use: Yes    Types: Marijuana    Comment: occasionally   Sexual activity: Yes  Other Topics Concern   Not on file  Social History Narrative   Pt lives with his brother    Social Determinants of Health   Financial Resource Strain: High Risk (03/24/2023)   Overall Financial Resource Strain (CARDIA)    Difficulty of Paying Living Expenses: Hard  Food Insecurity: No Food Insecurity (04/24/2023)   Hunger Vital Sign    Worried About Running Out of Food in the Last Year: Never true    Ran Out of Food in the Last Year: Never true  Transportation Needs: No Transportation Needs (04/24/2023)   PRAPARE - Administrator, Civil Service (Medical): No    Lack of Transportation (Non-Medical): No  Physical Activity: Not on file  Stress: Not on file  Social Connections: Moderately Integrated (03/12/2021)   Social Connection and Isolation Panel [NHANES]    Frequency of Communication with Friends and Family: More than three times a week    Frequency of Social Gatherings with Friends and Family: More than three times a week    Attends  Religious Services: More than 4  times per year    Active Member of Clubs or Organizations: Yes    Attends Banker Meetings: Never    Marital Status: Never married  Intimate Partner Violence: Not At Risk (04/24/2023)   Humiliation, Afraid, Rape, and Kick questionnaire    Fear of Current or Ex-Partner: No    Emotionally Abused: No    Physically Abused: No    Sexually Abused: No      Family History  Problem Relation Age of Onset   Diabetes Father    Arthritis Father    Hypertension Father    Diabetes Mother    Arthritis Mother    Hypertension Mother    Hypertension Brother     PHYSICAL EXAM: There were no vitals filed for this visit. GENERAL: Well nourished, well developed, and in no apparent distress at rest.  HEENT: Negative for arcus senilis or xanthelasma. There is no scleral icterus.  The mucous membranes are pink and moist.   NECK: Supple, No masses. Normal carotid upstrokes without bruits. No masses or thyromegaly.    CHEST: There are no chest wall deformities. There is no chest wall tenderness. Respirations are unlabored.  Lungs- *** CARDIAC:  JVP: *** cm          Normal rate with regular rhythm. No murmurs, rubs or gallops.  Pulses are 2+ and symmetrical in upper and lower extremities. *** edema.  ABDOMEN: Soft, non-tender, non-distended. There are no masses or hepatomegaly. There are normal bowel sounds.  EXTREMITIES: Warm and well perfused with no cyanosis, clubbing.  LYMPHATIC: No axillary or supraclavicular lymphadenopathy.  NEUROLOGIC: Patient is oriented x3 with no focal or lateralizing neurologic deficits.  PSYCH: Patients affect is appropriate, there is no evidence of anxiety or depression.  SKIN: Warm and dry; no lesions or wounds.     DATA REVIEW  ECG:NSR  ECHO: 08/22/2022: LVEF 35%, normal RV function 05/06/2022: LVEF 30-35%, normal RV function.  02/16/19: LVEF 45-50%, normal RV function.   CATH: 05/12/22: HEMODYNAMICS: RA:                   1 mmHg (mean) RV:                  22/1-3 mmHg PA:                  23/6 mmHg (12 mean) PCWP:            4 mmHg (mean)                                      Estimated Fick CO/CI   7 L/min, 2.9 L/min/m2                                                 TPG                 8  mmHg                                              PVR                 ~1 Lucretia Roers  Units  PAPi                >5       IMPRESSION: Low pre and post capillary filling pressures.  Normal cardiac output/cardiac index.  Normal PVR & PA mean No obstructive CAD, left dominant w/ large Lcx supplying multiple large marginals.    ASSESSMENT & PLAN:  Heart failure with reduced EF, Stage C, NYHA IIB-III Etiology of ZH:YQMVHQIONGE as demonstrated by coronary angiography; no family history. If he continues to have recurrent VT will consider evaluation for sarcoidosis. Device interrogation w/o arrhythmias NYHA class / AHA Stage: III; REDS 40% today.  Volume status & Diuretics:  currently taking lasix 80mg  daily; torsemide 80mg  daily for the next 4-5 days. Repeat labs today.  Vasodilators:Entresto 24/26mg  BID Beta-Blocker: Continue Toprol to 50mg  MRA: Continue spironolactone 12.5 mg daily.  No other medication changes. Cardiometabolic:continue farxiga to 10mg  daily Devices therapies & Valvulopathies:dcICD Advanced therapies: CPX in 2017 peak VO2 of 15 AND ve/vco2 of 35 but with submaximal effort; consistent with moderate HF limitations. Advanced therapies at this time limited by CKD and social barriers.   2. VT -No further episodes on device interrogation.  3. T2DM - Farxiga, insulin  - Followed by his PCP  4. CKD3B - Continue farxiga and Entresto - repeat labs pending  5. Panic attacks -Will follow-up with psychiatry.  6. Gluteal cellulitis  -Following with general surgery  7.  Shortness of breath - Much of his dyspnea is secondary to underlying pulmonary disease; he is volume overloaded today though. REDs increased  to 40%. Will start torsemide 80mg  daily although I feel that he will likely want to be admitted to [facility] for inpatient diuresis.   Sholom Dulude Advanced Heart Failure Mechanical Circulatory Support

## 2023-05-21 ENCOUNTER — Encounter (HOSPITAL_COMMUNITY): Payer: Self-pay | Admitting: Cardiology

## 2023-05-21 ENCOUNTER — Ambulatory Visit (HOSPITAL_COMMUNITY)
Admission: RE | Admit: 2023-05-21 | Discharge: 2023-05-21 | Disposition: A | Discharge status: Home / Self Care | Payer: Medicaid Other | Source: Ambulatory Visit | Attending: Cardiology | Admitting: Cardiology

## 2023-05-21 VITALS — BP 128/86 | HR 93 | Wt 294.2 lb

## 2023-05-21 DIAGNOSIS — Z7901 Long term (current) use of anticoagulants: Secondary | ICD-10-CM | POA: Insufficient documentation

## 2023-05-21 DIAGNOSIS — I472 Ventricular tachycardia, unspecified: Secondary | ICD-10-CM | POA: Diagnosis not present

## 2023-05-21 DIAGNOSIS — L03317 Cellulitis of buttock: Secondary | ICD-10-CM | POA: Insufficient documentation

## 2023-05-21 DIAGNOSIS — Z794 Long term (current) use of insulin: Secondary | ICD-10-CM | POA: Diagnosis not present

## 2023-05-21 DIAGNOSIS — N183 Chronic kidney disease, stage 3 unspecified: Secondary | ICD-10-CM | POA: Diagnosis not present

## 2023-05-21 DIAGNOSIS — I10 Essential (primary) hypertension: Secondary | ICD-10-CM | POA: Diagnosis not present

## 2023-05-21 DIAGNOSIS — Z95810 Presence of automatic (implantable) cardiac defibrillator: Secondary | ICD-10-CM | POA: Insufficient documentation

## 2023-05-21 DIAGNOSIS — N1832 Chronic kidney disease, stage 3b: Secondary | ICD-10-CM | POA: Insufficient documentation

## 2023-05-21 DIAGNOSIS — E1122 Type 2 diabetes mellitus with diabetic chronic kidney disease: Secondary | ICD-10-CM | POA: Insufficient documentation

## 2023-05-21 DIAGNOSIS — I130 Hypertensive heart and chronic kidney disease with heart failure and stage 1 through stage 4 chronic kidney disease, or unspecified chronic kidney disease: Secondary | ICD-10-CM | POA: Insufficient documentation

## 2023-05-21 DIAGNOSIS — F410 Panic disorder [episodic paroxysmal anxiety] without agoraphobia: Secondary | ICD-10-CM | POA: Diagnosis not present

## 2023-05-21 DIAGNOSIS — I5022 Chronic systolic (congestive) heart failure: Secondary | ICD-10-CM | POA: Diagnosis not present

## 2023-05-21 LAB — BASIC METABOLIC PANEL
Anion gap: 14 (ref 5–15)
BUN: 27 mg/dL — ABNORMAL HIGH (ref 6–20)
CO2: 23 mmol/L (ref 22–32)
Calcium: 9.4 mg/dL (ref 8.9–10.3)
Chloride: 101 mmol/L (ref 98–111)
Creatinine, Ser: 2.56 mg/dL — ABNORMAL HIGH (ref 0.61–1.24)
GFR, Estimated: 28 mL/min — ABNORMAL LOW (ref >60)
Glucose, Bld: 236 mg/dL — ABNORMAL HIGH (ref 70–99)
Potassium: 4.1 mmol/L (ref 3.5–5.1)
Sodium: 138 mmol/L (ref 135–145)

## 2023-05-21 LAB — BRAIN NATRIURETIC PEPTIDE: B Natriuretic Peptide: 29.3 pg/mL (ref 0.0–100.0)

## 2023-05-21 MED ORDER — ENTRESTO 49-51 MG PO TABS: 1.0000 | ORAL_TABLET | Freq: Two times a day (BID) | ORAL | 3 refills | Status: DC

## 2023-05-21 NOTE — Addendum Note (Signed)
Encounter addended by: Baird Cancer, RN on: 05/21/2023 1:52 PM  Actions taken: Order list changed

## 2023-05-21 NOTE — Patient Instructions (Signed)
Medication Changes:  INCREASE ENTRESTO TO 49/51MG  TWICE DAILY   TAKE TORSEMIDE 40MG  EVERY NIGHT FOR WEIGHT GAIN GREATER THAN 3 POUNDS   Lab Work:  Labs done today, your results will be available in MyChart, we will contact you for abnormal readings.  Referrals:  YOU HAVE BEEN REFERRED BACK TO [facility] THEY WILL REACH OUT TO YOU OR CALL TO ARRANGE THIS. PLEASE CALL us WITH ANY CONCERNS   Follow-Up in: 2 MONTHS WITH APP AS SCHEDULED   THEN AGAIN IN 4 MONTHS WITH DR. Gasper Lloyd PLEASE CALL OUR OFFICE AROUND FEBRUARY  TO GET SCHEDULED FOR YOUR APPOINTMENT. PHONE NUMBER IS [phone] OPTION 2   At the [facility], you and your health needs are our priority. We have a designated team specialized in the treatment of Heart Failure. This Care Team includes your primary Heart Failure Specialized Cardiologist (physician), Advanced Practice Providers (APPs- Physician Assistants and Nurse Practitioners), and Pharmacist who all work together to provide you with the care you need, when you need it.   You may see any of the following providers on your designated Care Team at your next follow up:  Dr. Arvilla Meres Dr. Marca Ancona Dr. Dorthula Nettles Dr. Theresia Bough Tonye Becket, NP Robbie Lis, [place] [facility] [place], [place] Brynda Peon, NP [place] Lee, NP Karle Plumber, PharmD   Please be sure to bring in all your medications bottles to every appointment.   Need to Contact us:  If you have any questions or concerns before your next appointment please send [place] a message through [place] or call our office at [phone].    TO LEAVE A MESSAGE FOR THE NURSE SELECT OPTION 2, PLEASE LEAVE A MESSAGE INCLUDING: YOUR NAME DATE OF BIRTH CALL BACK NUMBER REASON FOR CALL**this is important as we prioritize the call backs  YOU WILL RECEIVE A CALL BACK THE SAME DAY AS LONG AS YOU CALL BEFORE 4:00 PM

## 2023-05-22 ENCOUNTER — Encounter (HOSPITAL_COMMUNITY): Payer: Self-pay

## 2023-05-25 ENCOUNTER — Ambulatory Visit: Payer: Medicaid Other | Attending: Internal Medicine | Admitting: Internal Medicine

## 2023-05-25 ENCOUNTER — Encounter: Payer: Self-pay | Admitting: Cardiology

## 2023-05-25 VITALS — BP 108/69 | HR 81 | Temp 97.7°F | Ht 72.0 in | Wt 297.0 lb

## 2023-05-25 DIAGNOSIS — L729 Follicular cyst of the skin and subcutaneous tissue, unspecified: Secondary | ICD-10-CM | POA: Diagnosis not present

## 2023-05-25 DIAGNOSIS — M25462 Effusion, left knee: Secondary | ICD-10-CM

## 2023-05-25 DIAGNOSIS — I82622 Acute embolism and thrombosis of deep veins of left upper extremity: Secondary | ICD-10-CM

## 2023-05-25 DIAGNOSIS — Z09 Encounter for follow-up examination after completed treatment for conditions other than malignant neoplasm: Secondary | ICD-10-CM

## 2023-05-25 DIAGNOSIS — Z794 Long term (current) use of insulin: Secondary | ICD-10-CM

## 2023-05-25 DIAGNOSIS — E1142 Type 2 diabetes mellitus with diabetic polyneuropathy: Secondary | ICD-10-CM

## 2023-05-25 DIAGNOSIS — M545 Low back pain, unspecified: Secondary | ICD-10-CM | POA: Diagnosis not present

## 2023-05-25 DIAGNOSIS — G8929 Other chronic pain: Secondary | ICD-10-CM | POA: Diagnosis not present

## 2023-05-25 DIAGNOSIS — Z5986 Financial insecurity: Secondary | ICD-10-CM

## 2023-05-25 MED ORDER — GABAPENTIN 300 MG PO CAPS: 300.0000 mg | ORAL_CAPSULE | Freq: Every day | ORAL | 1 refills | Status: DC

## 2023-05-25 MED ORDER — BD PEN NEEDLE SHORT U/F 31G X 8 MM MISC: 6 refills | Status: DC

## 2023-05-25 NOTE — Progress Notes (Signed)
Patient ID: [name], male    DOB: 30-Sep-1965  MRN: [id number]  CC: Hospitalization Follow-up (Hpospitalization f/u./Discuss gabapentin /Lump on R side of head, painful to touch X3 weeks /Swelling of L knee X2-3 weeks /No to Tdap vax)   Subjective: [name] is a 57 y.o. male who presents for hosp f/u. His concerns today include:  Patient with history of NICM, systolic CHF s/p ICD placement, DM type 2, HL, CKD stage 3,  obesity, BPH, GERD and chronic LBP secondary to spondylosis and spinal stenosis, COVID-19 infection, VT storm 04/2022.    Patient was sent to the hospital by me on last visit 04/24/2023 because of acute swelling in the left upper extremity and chest pain.  He was hospitalized 11/15-17/2024 for acute deep venous thrombosis of the [place] per extremity.  Doppler ultrasound of LUE revealed: Findings consistent with acute deep vein thrombosis involving the left internal jugular vein, left axillary vein, left subclavian vein and left brachial veins. Findings consistent with age indeterminate superficial vein thrombosis involving the left cephalic vein. Patient was seen by cardiology.  He had 2D echo to evaluate for any thrombus in the heart given that his defibrillator is on the left side.  This was negative for thrombus.  Patient was placed and discharged on Eliquis.  Chest pain was thought to be atypical and likely caused by extensive left upper extremity DVT.  Today: Patient reports that he still has swelling in the left arm.  He has always slept on his left side and has tried not to do so since being discharged from the hospital but it is not working.  He cannot sleep flat on his back and is too uncomfortable to sleep on his right side.  He is taking the Eliquis.  He denies any bruising or bleeding.  Recently discovered a small lump in the right forehead close to the temporal area 3 weeks ago.  Slightly tender but not all the time.  Also reports swelling in the left  knee for the past 2 to 3 weeks and it is a little sore.  States that he intermittently has had swelling in both knees.  Not much pain.  Endorses stiffness in the knees first thing in the mornings and if he sits for too long.  He has seen orthopedics for his back.  He had an injection to the back which was helpful.  He is scheduled for CT scan of the lumbar spine tomorrow.     Patient Active Problem List   Diagnosis Date Noted   Acute deep vein thrombosis (DVT) of left upper extremity (HCC) 04/24/2023   Cellulitis of perineum 04/02/2023   Heart failure (HCC) 02/28/2023   Class 3 obesity 02/28/2023   SOB (shortness of breath) 02/27/2023   Loud snoring 10/28/2022   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   Hyperlipidemia associated with type 2 diabetes mellitus (HCC) 09/18/2022   PTSD (post-traumatic stress disorder) 07/10/2022   Panic attack 06/17/2022   Passive suicidal ideations 06/17/2022   History of ventricular tachycardia 06/14/2022   Essential hypertension 06/14/2022   Stage 3b chronic kidney disease (HCC) 05/15/2022   Acute gout due to renal impairment involving left ankle 05/07/2022   Acute gout due to renal impairment involving right knee 05/07/2022   Hyponatremia 05/07/2022   Diarrhea 05/06/2022   Hyperglycemia 05/06/2022   Cellulitis of buttock 05/06/2022   Acute on chronic systolic CHF (congestive heart failure) (HCC) 05/06/2022   Acute renal failure superimposed on stage 3b  chronic kidney disease (HCC) 05/06/2022   Hypokalemia 05/05/2022   Rectal abscess 05/05/2022   Left buttock abscess 05/05/2022   Cardiac arrest (HCC) 05/03/2022   AKI (acute kidney injury) (HCC) 05/03/2022   V-tach (HCC) 05/03/2022   CKD (chronic kidney disease) stage 2, GFR 60-89 ml/min 10/24/2019   Acute non-recurrent frontal sinusitis 06/27/2019   COVID-19 virus infection 06/27/2019   Glaucoma suspect 12/22/2018   Seasonal and perennial allergic rhinoconjunctivitis 10/25/2018    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   Urticaria 05/03/2018   S/P inguinal hernia repair 03/01/2018   Unilateral inguinal hernia without obstruction or gangrene 07/24/2017   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   Hypersomnia 02/28/2016   Abnormal PFT 02/24/2016   Dyspnea 01/28/2016   Diabetic polyneuropathy associated with type 2 diabetes mellitus (HCC) 12/18/2015   Lumbar back pain 12/18/2015   Type 2 diabetes mellitus with hyperlipidemia (HCC) 09/17/2015   Left median nerve neuropathy 04/16/2015   Lesion of right median nerve at forearm 04/16/2015   ICD (implantable cardioverter-defibrillator) in place 03/28/2014   Chronic systolic congestive heart failure (HCC) 03/28/2014   Nonischemic cardiomyopathy (HCC) 11/09/2013   Syncope 10/20/2013   Chest pain 07/28/2013   RENAL CALCULUS 05/13/2009   Obesity 05/08/2009   Former heavy cigarette smoker (20-39 per day) 05/08/2009   HOARSENESS 05/08/2009   GAD (generalized anxiety disorder) 02/28/2008   ASTHMATIC BRONCHITIS, ACUTE 02/28/2008   GERD 02/28/2008   IRRITABLE BOWEL SYNDROME 02/28/2008     Current Outpatient Medications on File Prior to Visit  Medication Sig Dispense Refill   Accu-Chek Softclix Lancets lancets Use as instructed 100 each 12   acetaminophen (TYLENOL) 500 MG tablet Take 1,000 mg by mouth every 8 (eight) hours as needed.     albuterol (VENTOLIN HFA) 108 (90 Base) MCG/ACT inhaler Inhale 2 puffs into the lungs every 6 (six) hours as needed for wheezing or shortness of breath. 18 g 2   amiodarone (PACERONE) 200 MG tablet Take 1 tablet (200 mg total) by mouth daily. 90 tablet 3   apixaban (ELIQUIS) 5 MG TABS tablet Take 2 tablets (10 mg total) by mouth 2 (two) times daily for 14 days, THEN 1 tablet (5 mg total) 2 (two) times daily. 116 tablet 0   atorvastatin (LIPITOR) 10 MG tablet Take 1 tablet (10 mg total) by mouth daily. 90 tablet 1   Blood Glucose Monitoring Suppl (ACCU-CHEK GUIDE)  w/Device KIT UAD 1 kit 0   Blood Glucose Monitoring Suppl DEVI 1 each by Does not apply route in the morning, at noon, and at bedtime. May substitute to any manufacturer covered by patient's insurance. 1 each 0   budesonide-formoterol (SYMBICORT) 160-4.5 MCG/ACT inhaler Inhale 2 puffs into the lungs in the morning and at bedtime. 1 each 12   cetirizine (EQ ALLERGY RELIEF, CETIRIZINE,) 10 MG tablet Take 1 tablet (10 mg total) by mouth daily. (Patient taking differently: Take 10 mg by mouth in the morning.) 90 tablet 1   cholecalciferol (VITAMIN D3) 25 MCG (1000 UNIT) tablet Take 1,000 Units by mouth in the morning.     clonazePAM (KLONOPIN) 0.5 MG tablet Take 1 tablet (0.5 mg total) by mouth 2 (two) times daily as needed for anxiety. 60 tablet 1   Clotrimazole 1 % OINT Apply to the affected area twice a day 56.7 g 0   Continuous Glucose Receiver (FREESTYLE LIBRE 2 READER) DEVI USE AS DIRECTED 1 each 0   Continuous Glucose Sensor (FREESTYLE LIBRE 2 SENSOR) MISC Use  to check blood sugar TID. Change sensor once every 14 days. E11.65 2 each 11   EPINEPHrine 0.3 mg/0.3 mL IJ SOAJ injection Inject 0.3 mg into the muscle as needed. 1 each 1   famotidine (PEPCID) 20 MG tablet TAKE 1 TABLET BY MOUTH 1 TO 2 TIMES DAILY AS NEEDED (Patient taking differently: Take 20 mg by mouth See admin instructions. TAKE 1 TABLET BY MOUTH 1 TO 2 TIMES DAILY AS NEEDED) 180 tablet 0   fluticasone (FLONASE) 50 MCG/ACT nasal spray Use 1-2 sprays in each nostril once a day as needed for nasal congestion. 16 g 5   Furosemide (FUROSCIX) 80 MG/10ML CTKT Inject 80 mg into the skin as directed.     glucose blood (ACCU-CHEK GUIDE) test strip Use as instructed 100 each 12   glucose blood (FREESTYLE LITE) test strip Use as instructed.  Please make sure this is compatible with [facility] 2 reader. 100 each 12   insulin aspart (NOVOLOG) 100 UNIT/ML injection Inject 24 Units into the skin 3 (three) times daily with meals.     insulin glargine  (LANTUS SOLOSTAR) 100 UNIT/ML Solostar Pen Inject 42 Units into the skin daily. (Patient taking differently: Inject 36 Units into the skin daily.)     Lancet Devices (ACCU-CHEK SOFTCLIX) lancets 1 each by Other route 3 (three) times daily. 1 each 0   loperamide (IMODIUM) 2 MG capsule Take 1 capsule (2 mg total) by mouth as needed for diarrhea or loose stools. 12 capsule 0   methocarbamol (ROBAXIN) 500 MG tablet Take 1 tablet (500 mg total) by mouth daily. (Patient taking differently: Take 500 mg by mouth at bedtime.) 90 tablet 1   metoprolol succinate (TOPROL-XL) 50 MG 24 hr tablet Take 1 tablet (50 mg total) by mouth daily. (Patient taking differently: Take 50 mg by mouth in the morning.) 90 tablet 3   nystatin (MYCOSTATIN/NYSTOP) powder Apply topically 3 (three) times daily. 15 g 0   potassium chloride SA (KLOR-CON M) 20 MEQ tablet Take 2 tablets (40 mEq total) by mouth daily. 60 tablet 3   predniSONE (DELTASONE) 10 MG tablet Take 4 tablets (40 mg total) by mouth daily with breakfast for 3 days, THEN 3 tablets (30 mg total) daily with breakfast for 3 days, THEN 2 tablets (20 mg total) daily with breakfast for 3 days, THEN 1 tablet (10 mg total) daily with breakfast for 3 days. 30 tablet 0   sacubitril-valsartan (ENTRESTO) 49-51 MG Take 1 tablet by mouth 2 (two) times daily. 60 tablet 3   sertraline (ZOLOFT) 50 MG tablet Take 1 tablet (50 mg total) by mouth in the morning. 30 tablet 2   spironolactone (ALDACTONE) 25 MG tablet Take 0.5 tablets (12.5 mg total) by mouth daily. (Patient taking differently: Take 12.5 mg by mouth in the morning.) 30 tablet 5   tamsulosin (FLOMAX) 0.4 MG CAPS capsule Take 2 capsules (0.8 mg total) by mouth daily. (Patient taking differently: Take 0.8 mg by mouth in the morning.) 180 capsule 1   Tiotropium Bromide Monohydrate (SPIRIVA RESPIMAT) 2.5 MCG/ACT AERS Inhale 2 puffs into the lungs daily. 1 each 11   torsemide (DEMADEX) 20 MG tablet TAKE 80MG  (4) TABLETS IN THE  MORNING, AND 40MG  (2) TABLETS IN THE EVENING STARTING SUNDAY 12/8 180 tablet 3   No current facility-administered medications on file prior to visit.    Allergies  Allergen Reactions   Bee Venom Anaphylaxis   Trulicity [Dulaglutide] Anaphylaxis and Diarrhea    Extra dose of Trulicity  caused cardiac arrest. Patient experience onset of diarrhea with using this medication - cleared after med stopped.    Social History   Socioeconomic History   Marital status: Single    Spouse name: Not on file   Number of children: Not on file   Years of education: Not on file   Highest education level: Not on file  Occupational History   Occupation: Disabled  Tobacco Use   Smoking status: Former    Current packs/day: 0.00    Average packs/day: 1 pack/day for 28.0 years (28.0 ttl pk-yrs)    Types: Cigarettes    Start date: 06/03/1986    Quit date: 06/03/2014    Years since quitting: 8.9   Smokeless tobacco: Never  Vaping Use   Vaping status: Never Used  Substance and Sexual Activity   Alcohol use: Yes   Drug use: Yes    Types: Marijuana    Comment: occasionally   Sexual activity: Yes  Other Topics Concern   Not on file  Social History Narrative   Pt lives with his brother    Social Drivers of Corporate investment banker Strain: Low Risk  (05/25/2023)   Overall Financial Resource Strain (CARDIA)    Difficulty of Paying Living Expenses: Not hard at all  Recent Concern: Financial Resource Strain - High Risk (03/24/2023)   Overall Financial Resource Strain (CARDIA)    Difficulty of Paying Living Expenses: Hard  Food Insecurity: No Food Insecurity (05/25/2023)   Hunger Vital Sign    Worried About Running Out of Food in the Last Year: Never true    Ran Out of Food in the Last Year: Never true  Transportation Needs: No Transportation Needs (05/25/2023)   PRAPARE - Administrator, Civil Service (Medical): No    Lack of Transportation (Non-Medical): No  Physical Activity:  Inactive (05/25/2023)   Exercise Vital Sign    Days of Exercise per Week: 0 days    Minutes of Exercise per Session: 0 min  Stress: Stress Concern Present (05/25/2023)   [employer] of Occupational Health - Occupational Stress Questionnaire    Feeling of Stress : Rather much  Social Connections: Moderately Integrated (05/25/2023)   Social Connection and Isolation Panel [NHANES]    Frequency of Communication with Friends and Family: More than three times a week    Frequency of Social Gatherings with Friends and Family: More than three times a week    Attends Religious Services: More than 4 times per year    Active Member of Clubs or Organizations: Yes    Attends Banker Meetings: 1 to 4 times per year    Marital Status: Never married  Intimate Partner Violence: Not At Risk (05/25/2023)   Humiliation, Afraid, Rape, and Kick questionnaire    Fear of Current or Ex-Partner: No    Emotionally Abused: No    Physically Abused: No    Sexually Abused: No    Family History  Problem Relation Age of Onset   Diabetes Father    Arthritis Father    Hypertension Father    Diabetes Mother    Arthritis Mother    Hypertension Mother    Hypertension Brother     Past Surgical History:  Procedure Laterality Date   CARDIAC CATHETERIZATION  08/10/13   CHOLECYSTECTOMY  ~ 2010   IMPLANTABLE CARDIOVERTER DEFIBRILLATOR IMPLANT  12/23/2013   STJ Fortify ICD implanted by Dr Johney Frame for cardiomyopathy and syncope   IMPLANTABLE CARDIOVERTER DEFIBRILLATOR IMPLANT N/A  12/23/2013   Procedure: IMPLANTABLE CARDIOVERTER DEFIBRILLATOR IMPLANT;  Surgeon: Gardiner Rhyme, MD;  Location: [facility] CATH LAB;  Service: Cardiovascular;  Laterality: N/A;   INGUINAL HERNIA REPAIR Left 03/01/2018   Procedure: OPEN REPAIR OF LEFT INGUINAL HERNIA WITH MESH;  Surgeon: Gaynelle Adu, MD;  Location: [facility] ORS;  Service: General;  Laterality: Left;   LEAD REVISION/REPAIR N/A 05/15/2022   Procedure: LEAD REVISION/REPAIR;   Surgeon: Lanier Prude, MD;  Location: [facility] INVASIVE CV LAB;  Service: Cardiovascular;  Laterality: N/A;   LEFT HEART CATHETERIZATION WITH CORONARY ANGIOGRAM N/A 08/10/2013   Procedure: LEFT HEART CATHETERIZATION WITH CORONARY ANGIOGRAM;  Surgeon: Corky Crafts, MD;  Location: [facility] CATH LAB;  Service: Cardiovascular;  Laterality: N/A;   ORIF HUMERUS FRACTURE Left 08/12/2013   Procedure: OPEN REDUCTION INTERNAL FIXATION (ORIF) HUMERAL SHAFT FRACTURE;  Surgeon: Nadara Mustard, MD;  Location: [facility] OR;  Service: Orthopedics;  Laterality: Left;  Open Reduction Internal Fixation Left Humerus   RIGHT/LEFT HEART CATH AND CORONARY ANGIOGRAPHY N/A 05/12/2022   Procedure: RIGHT/LEFT HEART CATH AND CORONARY ANGIOGRAPHY;  Surgeon: Dorthula Nettles, DO;  Location: [facility] INVASIVE CV LAB;  Service: Cardiovascular;  Laterality: N/A;   URETEROSCOPY     "laser for kidney stones"    ROS: Review of Systems Negative except as stated above  PHYSICAL EXAM: BP 108/69 (BP Location: Left Arm, Patient Position: Sitting, Cuff Size: Large)   Pulse 81   Temp 97.7 F (36.5 C) (Oral)   Ht 6' (1.829 m)   Wt 297 lb (134.7 kg)   SpO2 92%   BMI 40.28 kg/m   Physical Exam   General appearance - alert, well appearing, and in no distress Mental status - normal mood, behavior, speech, dress, motor activity, and thought processes Chest - clear to auscultation, no wheezes, rales or rhonchi, symmetric air entry Heart - normal rate, regular rhythm, normal S1, S2, no murmurs, rubs, clicks or gallops Musculoskeletal -slight swelling of the left knee medially.  No point tenderness.  He has mild crepitus on passive range of motion Extremities -no edema in the lower extremities.  He still has moderate edema in the left upper extremity Skin -he has a 1 cm soft movable subcutaneous nodule felt in the right side of the forehead just anterior to the temple area     Latest Ref Rng & Units 05/21/2023   12:05 PM 05/13/2023    3:26 PM  04/26/2023    5:54 AM  CMP  Glucose 70 - 99 mg/dL 914  782  956   BUN 6 - 20 mg/dL 27  23  29    Creatinine 0.61 - 1.24 mg/dL 2.13  0.86  5.78   Sodium 135 - 145 mmol/L 138  138  140   Potassium 3.5 - 5.1 mmol/L 4.1  3.9  3.6   Chloride 98 - 111 mmol/L 101  102  104   CO2 22 - 32 mmol/L 23  24  26    Calcium 8.9 - 10.3 mg/dL 9.4  9.7  9.1   Total Protein 6.5 - 8.1 g/dL  7.2  6.6   Total Bilirubin <1.2 mg/dL  0.6  0.7   Alkaline Phos 38 - 126 U/L  48  51   AST 15 - 41 U/L  16  13   ALT 0 - 44 U/L  18  21    Lipid Panel     Component Value Date/Time   CHOL 166 02/13/2023 1658   TRIG 283 (H) 02/13/2023 1658  HDL 44 02/13/2023 1658   CHOLHDL 3.8 02/13/2023 1658   CHOLHDL 4 05/08/2009 1040   VLDL 27.0 05/08/2009 1040   LDLCALC 76 02/13/2023 1658    CBC    Component Value Date/Time   WBC 5.9 05/13/2023 1526   RBC 4.46 05/13/2023 1526   HGB 13.4 05/13/2023 1526   HGB 14.0 08/26/2021 1608   HCT 41.5 05/13/2023 1526   HCT 40.9 08/26/2021 1608   PLT 290 05/13/2023 1526   PLT 286 08/26/2021 1608   MCV 93.0 05/13/2023 1526   MCV 88 08/26/2021 1608   MCH 30.0 05/13/2023 1526   MCHC 32.3 05/13/2023 1526   RDW 14.4 05/13/2023 1526   RDW 13.0 08/26/2021 1608   LYMPHSABS 2.5 04/05/2023 0315   LYMPHSABS 1.7 06/28/2018 1440   MONOABS 0.7 04/05/2023 0315   EOSABS 0.2 04/05/2023 0315   EOSABS 0.3 06/28/2018 1440   BASOSABS 0.1 04/05/2023 0315   BASOSABS 0.0 06/28/2018 1440    ASSESSMENT AND PLAN: 1. Hospital discharge follow-up (Primary)   2. Acute deep vein thrombosis (DVT) of other vein of left upper extremity (HCC) Patient with extensive DVT in the left upper extremity and subclavian.  It is thought that this likely develop from him having an ICD in place on that side and sleeping on the left side.  Encouraged him to try to sleep on the right side for now and to elevate the arm on a few pillows.  Will send to hematology to get an opinion on how long he should remain on  anticoagulation therapy.  I am thinking a minimum of 3 months but likely longer given how extensive the clot is an ongoing issue of him still sleeping on the left side. - Ambulatory referral to Hematology / Oncology  3. Subcutaneous cyst Recommend that we observe this for now.  If any increase in size we can address then.  4. Swelling of joint, knee, left Symptoms suggestive of osteoarthritis.  We will get an x-ray of the knee.  Recommend regular Tylenol as needed. - DG Knee Complete 4 Views Left; Future  5. Diabetic polyneuropathy associated with type 2 diabetes mellitus (HCC) Patient requested refill on gabapentin.  This was sent. - gabapentin (NEURONTIN) 300 MG capsule; Take 1 capsule (300 mg total) by mouth at bedtime.  Dispense: 90 capsule; Refill: 1 - Insulin Pen Needle (B-D ULTRAFINE III SHORT PEN) 31G X 8 MM MISC; USE AS DIRECTED  Dispense: 100 each; Refill: 6  6. Chronic midline low back pain without sciatica Being followed by orthopedics.  He has received an injection to the back which he found helpful.     Patient was given the opportunity to ask questions.  Patient verbalized understanding of the plan and was able to repeat key elements of the plan.   This documentation was completed using Paediatric nurse.  Any transcriptional errors are unintentional.  Orders Placed This Encounter  Procedures   DG Knee Complete 4 Views Left   Ambulatory referral to Hematology / Oncology     Requested Prescriptions   Signed Prescriptions Disp Refills   gabapentin (NEURONTIN) 300 MG capsule 90 capsule 1    Sig: Take 1 capsule (300 mg total) by mouth at bedtime.   Insulin Pen Needle (B-D ULTRAFINE III SHORT PEN) 31G X 8 MM MISC 100 each 6    Sig: USE AS DIRECTED    No follow-ups on file.  Jonah Blue, MD, FACP
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
## 2023-05-27 ENCOUNTER — Ambulatory Visit
Admission: RE | Admit: 2023-05-27 | Discharge: 2023-05-27 | Disposition: A | Discharge status: Home / Self Care | Payer: Medicaid Other | Source: Ambulatory Visit | Attending: Physical Medicine and Rehabilitation | Admitting: Physical Medicine and Rehabilitation

## 2023-05-27 DIAGNOSIS — M47816 Spondylosis without myelopathy or radiculopathy, lumbar region: Secondary | ICD-10-CM | POA: Diagnosis not present

## 2023-05-27 DIAGNOSIS — M48061 Spinal stenosis, lumbar region without neurogenic claudication: Secondary | ICD-10-CM | POA: Diagnosis not present

## 2023-05-27 DIAGNOSIS — M545 Low back pain, unspecified: Secondary | ICD-10-CM

## 2023-05-28 ENCOUNTER — Telehealth (HOSPITAL_COMMUNITY): Payer: Medicaid Other | Admitting: Psychiatry

## 2023-05-28 ENCOUNTER — Encounter (HOSPITAL_COMMUNITY): Payer: Self-pay | Admitting: Psychiatry

## 2023-05-28 DIAGNOSIS — F411 Generalized anxiety disorder: Secondary | ICD-10-CM

## 2023-05-28 DIAGNOSIS — F4310 Post-traumatic stress disorder, unspecified: Secondary | ICD-10-CM | POA: Diagnosis not present

## 2023-05-28 DIAGNOSIS — F410 Panic disorder [episodic paroxysmal anxiety] without agoraphobia: Secondary | ICD-10-CM | POA: Diagnosis not present

## 2023-05-28 MED ORDER — SERTRALINE HCL 50 MG PO TABS: 75.0000 mg | ORAL_TABLET | Freq: Every morning | ORAL | 2 refills | Status: DC

## 2023-05-29 ENCOUNTER — Other Ambulatory Visit (HOSPITAL_COMMUNITY): Payer: Self-pay

## 2023-05-29 ENCOUNTER — Other Ambulatory Visit (HOSPITAL_COMMUNITY): Payer: Self-pay | Admitting: Pharmacy Technician

## 2023-05-29 ENCOUNTER — Telehealth (HOSPITAL_COMMUNITY): Payer: Self-pay | Admitting: Pharmacy Technician

## 2023-05-29 MED ORDER — CLONAZEPAM 0.5 MG PO TABS: 0.5000 mg | ORAL_TABLET | Freq: Two times a day (BID) | ORAL | 1 refills | Status: DC | PRN

## 2023-05-29 NOTE — Progress Notes (Signed)
Opened in error

## 2023-05-29 NOTE — Telephone Encounter (Signed)
Patient Advocate Encounter   Received notification from [facility] that prior authorization for Torsemide is required.   PA submitted on CoverMyMeds Key  [id number] Status is pending   Will continue to follow.

## 2023-06-01 NOTE — Telephone Encounter (Signed)
Advanced Heart Failure Patient Advocate Encounter  Prior Authorization for Torsemide has been approved.    PA#  [id number] Effective dates: 05/29/23 through 05/28/24  Archer Asa, CPhT

## 2023-06-04 ENCOUNTER — Other Ambulatory Visit: Payer: Self-pay | Admitting: Internal Medicine

## 2023-06-04 ENCOUNTER — Encounter: Payer: Self-pay | Admitting: Internal Medicine

## 2023-06-04 DIAGNOSIS — N184 Chronic kidney disease, stage 4 (severe): Secondary | ICD-10-CM | POA: Diagnosis not present

## 2023-06-04 DIAGNOSIS — E279 Disorder of adrenal gland, unspecified: Secondary | ICD-10-CM | POA: Diagnosis not present

## 2023-06-04 DIAGNOSIS — I428 Other cardiomyopathies: Secondary | ICD-10-CM | POA: Diagnosis not present

## 2023-06-04 DIAGNOSIS — D649 Anemia, unspecified: Secondary | ICD-10-CM | POA: Diagnosis not present

## 2023-06-04 DIAGNOSIS — N400 Enlarged prostate without lower urinary tract symptoms: Secondary | ICD-10-CM | POA: Diagnosis not present

## 2023-06-04 DIAGNOSIS — N2581 Secondary hyperparathyroidism of renal origin: Secondary | ICD-10-CM | POA: Diagnosis not present

## 2023-06-04 DIAGNOSIS — E876 Hypokalemia: Secondary | ICD-10-CM | POA: Diagnosis not present

## 2023-06-04 DIAGNOSIS — E1122 Type 2 diabetes mellitus with diabetic chronic kidney disease: Secondary | ICD-10-CM | POA: Diagnosis not present

## 2023-06-04 MED ORDER — APIXABAN 5 MG PO TABS: 5.0000 mg | ORAL_TABLET | Freq: Two times a day (BID) | ORAL | 2 refills | Status: DC

## 2023-06-08 ENCOUNTER — Encounter: Payer: Medicaid Other | Attending: Cardiology

## 2023-06-08 DIAGNOSIS — Z95810 Presence of automatic (implantable) cardiac defibrillator: Secondary | ICD-10-CM

## 2023-06-08 DIAGNOSIS — I5022 Chronic systolic (congestive) heart failure: Secondary | ICD-10-CM

## 2023-06-14 ENCOUNTER — Encounter (HOSPITAL_COMMUNITY): Payer: Self-pay

## 2023-06-15 DIAGNOSIS — F4312 Post-traumatic stress disorder, chronic: Secondary | ICD-10-CM | POA: Diagnosis not present

## 2023-06-15 DIAGNOSIS — F411 Generalized anxiety disorder: Secondary | ICD-10-CM | POA: Diagnosis not present

## 2023-06-15 MED ORDER — METOLAZONE 5 MG PO TABS: 5.0000 mg | ORAL_TABLET | ORAL | Status: DC

## 2023-06-15 MED ORDER — TORSEMIDE 20 MG PO TABS: 80.0000 mg | ORAL_TABLET | Freq: Two times a day (BID) | ORAL | 3 refills | Status: DC

## 2023-06-15 NOTE — Telephone Encounter (Signed)
 I called patient. Appointment scheduled for 06/18/2023 at 3:30pm.

## 2023-06-15 NOTE — Telephone Encounter (Signed)
 Received note from [facility], med list updated accordingly:

## 2023-06-18 ENCOUNTER — Encounter: Payer: Self-pay | Admitting: Physical Medicine and Rehabilitation

## 2023-06-18 ENCOUNTER — Ambulatory Visit (INDEPENDENT_AMBULATORY_CARE_PROVIDER_SITE_OTHER): Payer: Medicaid Other | Admitting: Physical Medicine and Rehabilitation

## 2023-06-18 DIAGNOSIS — M545 Low back pain, unspecified: Secondary | ICD-10-CM

## 2023-06-18 DIAGNOSIS — G8929 Other chronic pain: Secondary | ICD-10-CM | POA: Diagnosis not present

## 2023-06-18 DIAGNOSIS — M461 Sacroiliitis, not elsewhere classified: Secondary | ICD-10-CM

## 2023-06-18 NOTE — Progress Notes (Signed)
 Patient is here for CT review. Function.  Was wondering about an injection.  Immobilizing (10)   Unable to move or talk due to intensity of pain/unable to sleep and unable to use distraction. Severe range order  Average Pain 10

## 2023-06-18 NOTE — Progress Notes (Signed)
 [name] - 58 y.o. male MRN [id number]  Date of birth: 28-Oct-1965  Office Visit Note: Visit Date: 06/18/2023 PCP: Vicci Barnie NOVAK, MD Referred by: Vicci Barnie NOVAK, MD  Subjective: Chief Complaint  Patient presents with   Lower Back - Follow-up   HPI: [name] is a 58 y.o. male who comes in today for evaluation of chronic, worsening and severe bilateral lower back pain, intermittent radiation of pain to buttocks and posterior thighs down to knees. Pain ongoing for several years, worsens with standing and walking. He describes pain as sore, aching and throbbing, currently rates as 8 out of 10. Some relief of pain with home exercise regimen, rest and use of medications. Recent CT of lumbar spine shows right subarticular disc protrusion at the level of L2-L3 causing central canal narrowing and right greater than left lateral recess stenosis.  Upon independent review there is also a disc protrusion at the level of L4-L5 causing central canal narrowing.  Patient underwent bilateral sacroiliac joint injections in our office on 03/30/2023, he reports significant relief, greater than 50% for approximately 2 months with this procedure.  He also reports increased functional ability, including walking and standing post injection.  He is here today as his pain has gradually increased over the last several weeks, would like to discuss repeat injections.  Patient denies focal weakness, numbness and tingling.  No recent trauma or falls.  No bowel or bladder incontinence.     Review of Systems  Musculoskeletal:  Positive for back pain.  Neurological:  Negative for tingling, sensory change, focal weakness and weakness.  All other systems reviewed and are negative.  Otherwise per HPI.  Assessment & Plan: Visit Diagnoses:    ICD-10-CM   1. Chronic bilateral low back pain without sciatica  M54.50 Ambulatory referral to Physical Medicine Rehab   G89.29     2. Sacroiliitis (HCC)  M46.1  Ambulatory referral to Physical Medicine Rehab       Plan: Findings:  Chronic, worsening and severe bilateral lower back pain, intermittent radiation of pain to buttocks and posterior thighs down to knees.  Most severe discomfort seems to be localized to bilateral lower back and buttock regions.  Patient's clinical presentation and exam are somewhat complex.  Significant relief of pain with prior bilateral sacroiliac joint injections.  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  His symptoms do not seem to fit with classic neurogenic claudication however we cannot rule out central stenosis as a pain generator.  Next step is to perform diagnostic and hopefully therapeutic bilateral sacroiliac joint injections under fluoroscopic guidance.  If good relief of pain with these injections I explained to patient that we can repeat these injections, however very infrequently.  Should his pain persist we could always look at performing lumbar epidural steroid injection.  Patient has no questions at this time.  No red flag symptoms noted upon exam today.    Meds & Orders: No orders of the defined types were placed in this encounter.   Orders Placed This Encounter  Procedures   Ambulatory referral to Physical Medicine Rehab    Follow-up: Return for Bilateral sacroiliac joint injections.   Procedures: No procedures performed      Clinical History: CLINICAL DATA:  Low back pain, symptoms persist with > 6 wks treatment   EXAM: CT LUMBAR SPINE WITHOUT CONTRAST   TECHNIQUE: Multidetector CT imaging of the lumbar spine was  performed without intravenous contrast administration. Multiplanar CT image reconstructions were also generated.   RADIATION DOSE REDUCTION: This exam was performed according to the departmental dose-optimization program which includes automated exposure control, adjustment of the mA and/or kV according to patient size and/or  use of iterative reconstruction technique.   COMPARISON:  None Available.   FINDINGS: Segmentation: 5 rib-bearing vertebral bodies.   Alignment: No substantial sagittal subluxation.   Vertebrae: Vertebral body heights are maintained.   Paraspinal and other soft tissues: Aorta bi-iliac calcific atherosclerosis.   Disc levels: Moderate multilevel degenerative change including degenerative disc height loss and endplate. At L2-L3 there is disc bulging with right subarticular disc protrusion which results in canal stenosis and right greater than left subarticular recess stenosis with potential for impingement. Additional disc protrusion at L4-L5 also narrows the canal. Lower lumbar facet arthropathy.   IMPRESSION: 1. At L2-L3, disc bulging with right subarticular disc protrusion that results in canal stenosis and right greater than left subarticular recess stenosis with the potential for impingement. An MRI could better assess the canal and foramina if clinically warranted. 2. Moderate multilevel degenerative disease and lower lumbar facet arthropathy. 3. No acute fracture or malalignment.     Electronically Signed   By: Gilmore GORMAN Molt M.D.   On: 06/14/2023 21:32   He reports that he quit smoking about 9 years ago. His smoking use included cigarettes. He started smoking about 37 years ago. He has a 28 pack-year smoking history. He has never used smokeless tobacco.  Recent Labs    12/12/22 0000 02/13/23 1608 04/03/23 0854  HGBA1C 8.9 9.1* 9.3*    Objective:  VS:  HT:    WT:   BMI:     BP:   HR: bpm  TEMP: ( )  RESP:  Physical Exam Vitals and nursing note reviewed.  HENT:     Head: Normocephalic and atraumatic.     Right Ear: External ear normal.     Left Ear: External ear normal.     Nose: Nose normal.     Mouth/Throat:     Mouth: Mucous membranes are moist.  Eyes:     Extraocular Movements: Extraocular movements intact.  Cardiovascular:     Rate and  Rhythm: Normal rate.     Pulses: Normal pulses.  Pulmonary:     Effort: Pulmonary effort is normal.  Abdominal:     General: Abdomen is flat. There is no distension.  Musculoskeletal:        General: Tenderness present.     Cervical back: Normal range of motion.     Comments: Patient has good distal strength with no pain over the greater trochanters. Positive FABER, Gaenslen's and compression testing on the right.   Skin:    General: Skin is warm and dry.     Capillary Refill: Capillary refill takes less than 2 seconds.  Neurological:     General: No focal deficit present.     Mental Status: He is alert and oriented to person, place, and time.  Psychiatric:        Mood and Affect: Mood normal.        Behavior: Behavior normal.     Ortho Exam  Imaging: No results found.  Past Medical/Family/Surgical/Social History: Medications & Allergies reviewed per EMR, new medications updated. Patient Active Problem List   Diagnosis Date Noted   Acute deep vein thrombosis (DVT) of left upper extremity (HCC) 04/24/2023   Cellulitis of perineum 04/02/2023   Heart failure (HCC) 02/28/2023  Class 3 obesity 02/28/2023   SOB (shortness of breath) 02/27/2023   Loud snoring 10/28/2022   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   Hyperlipidemia associated with type 2 diabetes mellitus (HCC) 09/18/2022   PTSD (post-traumatic stress disorder) 07/10/2022   Panic attack 06/17/2022   Passive suicidal ideations 06/17/2022   History of ventricular tachycardia 06/14/2022   Essential hypertension 06/14/2022   Stage 3b chronic kidney disease (HCC) 05/15/2022   Acute gout due to renal impairment involving left ankle 05/07/2022   Acute gout due to renal impairment involving right knee 05/07/2022   Hyponatremia 05/07/2022   Diarrhea 05/06/2022   Hyperglycemia 05/06/2022   Cellulitis of buttock 05/06/2022   Acute on chronic systolic CHF (congestive heart failure) (HCC) 05/06/2022   Acute renal  failure superimposed on stage 3b chronic kidney disease (HCC) 05/06/2022   Hypokalemia 05/05/2022   Rectal abscess 05/05/2022   Left buttock abscess 05/05/2022   Cardiac arrest (HCC) 05/03/2022   AKI (acute kidney injury) (HCC) 05/03/2022   V-tach (HCC) 05/03/2022   CKD (chronic kidney disease) stage 2, GFR 60-89 ml/min 10/24/2019   Acute non-recurrent frontal sinusitis 06/27/2019   COVID-19 virus infection 06/27/2019   Glaucoma suspect 12/22/2018   Seasonal and perennial allergic rhinoconjunctivitis 10/25/2018   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   Urticaria 05/03/2018   S/P inguinal hernia repair 03/01/2018   Unilateral inguinal hernia without obstruction or gangrene 07/24/2017   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   Hypersomnia 02/28/2016   Abnormal PFT 02/24/2016   Dyspnea 01/28/2016   Diabetic polyneuropathy associated with type 2 diabetes mellitus (HCC) 12/18/2015   Lumbar back pain 12/18/2015   Type 2 diabetes mellitus with hyperlipidemia (HCC) 09/17/2015   Left median nerve neuropathy 04/16/2015   Lesion of right median nerve at forearm 04/16/2015   ICD (implantable cardioverter-defibrillator) in place 03/28/2014   Chronic systolic congestive heart failure (HCC) 03/28/2014   Nonischemic cardiomyopathy (HCC) 11/09/2013   Syncope 10/20/2013   Chest pain 07/28/2013   RENAL CALCULUS 05/13/2009   Obesity 05/08/2009   Former heavy cigarette smoker (20-39 per day) 05/08/2009   HOARSENESS 05/08/2009   GAD (generalized anxiety disorder) 02/28/2008   ASTHMATIC BRONCHITIS, ACUTE 02/28/2008   GERD 02/28/2008   IRRITABLE BOWEL SYNDROME 02/28/2008   Past Medical History:  Diagnosis Date   AICD (automatic cardioverter/defibrillator) present    Chronic bronchitis (HCC)    get it most q yr (12/23/2013)   Chronic systolic (congestive) heart failure (HCC)    Fracture of left humerus    a. 07/2013.   GERD (gastroesophageal reflux disease)     not at present time   Heart murmur    born w/it    History of renal calculi    Hypertension    Kidney stones    NICM (nonischemic cardiomyopathy) (HCC)    a. 07/2013 Echo: EF 20-25%, diff HK, Gr2 DD, mild MR, mod dil LA/RA. 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    Shockable heart rhythm detected by automated external defibrillator    Syncope    Type II diabetes mellitus (HCC) 2006   Family History  Problem Relation Age of Onset   Diabetes Father    Arthritis Father    Hypertension Father    Diabetes Mother    Arthritis Mother    Hypertension Mother    Hypertension Brother    Past Surgical History:  Procedure Laterality Date  CARDIAC CATHETERIZATION  08/10/13   CHOLECYSTECTOMY  ~ 2010   IMPLANTABLE CARDIOVERTER DEFIBRILLATOR IMPLANT  12/23/2013   STJ Fortify ICD implanted by Dr Kelsie for cardiomyopathy and syncope   IMPLANTABLE CARDIOVERTER DEFIBRILLATOR IMPLANT N/A 12/23/2013   Procedure: IMPLANTABLE CARDIOVERTER DEFIBRILLATOR IMPLANT;  Surgeon: Lynwood JONETTA Kelsie, MD;  Location: [facility] CATH LAB;  Service: Cardiovascular;  Laterality: N/A;   INGUINAL HERNIA REPAIR Left 03/01/2018   Procedure: OPEN REPAIR OF LEFT INGUINAL HERNIA WITH MESH;  Surgeon: Tanda Locus, MD;  Location: [facility] ORS;  Service: General;  Laterality: Left;   LEAD REVISION/REPAIR N/A 05/15/2022   Procedure: LEAD REVISION/REPAIR;  Surgeon: [name] Ole DASEN, MD;  Location: [facility] INVASIVE CV LAB;  Service: Cardiovascular;  Laterality: N/A;   LEFT HEART CATHETERIZATION WITH CORONARY ANGIOGRAM N/A 08/10/2013   Procedure: LEFT HEART CATHETERIZATION WITH CORONARY ANGIOGRAM;  Surgeon: Candyce GORMAN Reek, MD;  Location: [facility] CATH LAB;  Service: Cardiovascular;  Laterality: N/A;   ORIF HUMERUS FRACTURE Left 08/12/2013   Procedure: OPEN REDUCTION INTERNAL FIXATION (ORIF) HUMERAL SHAFT FRACTURE;  Surgeon: Jerona LULLA Sage, MD;  Location: [facility] OR;  Service: Orthopedics;   Laterality: Left;  Open Reduction Internal Fixation Left Humerus   RIGHT/LEFT HEART CATH AND CORONARY ANGIOGRAPHY N/A 05/12/2022   Procedure: RIGHT/LEFT HEART CATH AND CORONARY ANGIOGRAPHY;  Surgeon: Gardenia Led, DO;  Location: [facility] INVASIVE CV LAB;  Service: Cardiovascular;  Laterality: N/A;   URETEROSCOPY     laser for kidney stones   Social History   Occupational History   Occupation: Disabled  Tobacco Use   Smoking status: Former    Current packs/day: 0.00    Average packs/day: 1 pack/day for 28.0 years (28.0 ttl pk-yrs)    Types: Cigarettes    Start date: 06/03/1986    Quit date: 06/03/2014    Years since quitting: 9.0   Smokeless tobacco: Never  Vaping Use   Vaping status: Never Used  Substance and Sexual Activity   Alcohol use: Yes   Drug use: Yes    Types: Marijuana    Comment: occasionally   Sexual activity: Yes

## 2023-06-19 ENCOUNTER — Ambulatory Visit: Payer: Medicaid Other | Admitting: Internal Medicine

## 2023-06-20 ENCOUNTER — Other Ambulatory Visit (HOSPITAL_COMMUNITY): Payer: Self-pay | Admitting: Cardiology

## 2023-06-22 ENCOUNTER — Encounter (HOSPITAL_COMMUNITY): Payer: Medicaid Other

## 2023-06-22 ENCOUNTER — Encounter: Payer: Medicaid Other | Attending: Cardiology

## 2023-06-22 DIAGNOSIS — I5022 Chronic systolic (congestive) heart failure: Secondary | ICD-10-CM

## 2023-06-22 DIAGNOSIS — Z95810 Presence of automatic (implantable) cardiac defibrillator: Secondary | ICD-10-CM
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
## 2023-06-23 ENCOUNTER — Telehealth (HOSPITAL_COMMUNITY): Payer: Medicaid Other | Admitting: Psychiatry

## 2023-06-23 ENCOUNTER — Encounter (HOSPITAL_COMMUNITY): Payer: Self-pay | Admitting: Psychiatry

## 2023-06-23 DIAGNOSIS — F411 Generalized anxiety disorder: Secondary | ICD-10-CM | POA: Diagnosis not present

## 2023-06-23 DIAGNOSIS — F410 Panic disorder [episodic paroxysmal anxiety] without agoraphobia: Secondary | ICD-10-CM

## 2023-06-23 DIAGNOSIS — F4310 Post-traumatic stress disorder, unspecified: Secondary | ICD-10-CM

## 2023-06-23 NOTE — Progress Notes (Signed)
 EPIC Encounter for ICM Monitoring  Patient Name: [name] is a 58 y.o. male Date: 06/23/2023 Primary Care Physican: Vicci Barnie NOVAK, MD Primary Cardiologist: Crenshaw/[name] Electrophysiologist: [name] 01/07/2023 Weight: 280 lbs 02/13/2023 Weight: 297 lbs 03/25/2023 Weight: 297 lbs    06/12/2023 Weight: 289 lbs                        Spoke with patient and heart failure questions reviewed.  Transmission results reviewed.  Pt asymptomatic for fluid accumulation.  Reports feeling well at this time and voices no complaints.   Patient said nephrologist instructed him to take Metolazone  x 4 days and 4th dosage will be tomorrow.  Explained his per my chart note from HF clinic it says he should take the Metolazone  once a week.   He is experience diarrhea today.     [facility] thoracic impedance suggesting normal fluid levels with the exception of possible fluid accumulation from 1/4-1/9.   Prescribed dosage:  Torsemide  20 mg take 4 tablet(s) (80 mg total) by mouth twice a day Potassium 20 mEq take 2 tablets (40 mEq total) by mouth every day Metolazone  5 mg take 1 tablet by mouth once a week   Labs: 05/21/2023 Creatinine 2.56, BUN 27, Potassium 4.1, Sodium 138, GFR 28  05/13/2023 Creatinine 2.75, BUN 23, Potassium 3.9, Sodium 138, GFR 26  04/26/2023 Creatinine 2.45, BUN 29, Potassium 3.6, Sodium 140, GFR 30  04/25/2023 Creatinine 2.50, BUN 28, Potassium 3.7, Sodium 139, GFR 29  04/24/2023 Creatinine 2.58, BUN 27, Potassium 4.0, Sodium 139, GFR 28  04/05/2023 Creatinine 2.52, BUN 43, Potassium 4.3, Sodium 140, GFR 29 04/04/2023 Creatinine 2.83, BUN 42, Potassium 4.5, Sodium 139, GFR 25  04/03/2023 Creatinine 2.65, BUN 41, Potassium 4.0, Sodium 141, GFR 27  04/02/2023 Creatinine 3.14, BUN 44, Potassium 4.0, Sodium 134, GFR 22  A complete set of results can be found in Results Review.   Recommendations:  Advised to call Nephrologist office today to confirm how many days in a row he should  take Metolazone  and not to take the 4th dosage tomorrow until he speaks to the office.  Advised to inform Nephrologist office he is experiencing diarrhea.  Advised to use ER if condition changes.    Follow-up plan: ICM clinic phone appointment on 07/13/2023.   91 day device clinic remote transmission 08/19/2023.     EP/Cardiology Office Visits:  Recall 11/02/2023 with Dr [name] or [name], PA.  07/22/2023 with HF Clinic.  Recall 09/18/2023 with [name].   Copy of ICM check sent to Dr [name].     3 month ICM trend: 06/22/2023.    12-14 Month ICM trend:     [name], RN 06/23/2023 8:53 AM

## 2023-06-24 ENCOUNTER — Encounter (HOSPITAL_COMMUNITY): Payer: Self-pay | Admitting: Psychiatry

## 2023-06-26 NOTE — Progress Notes (Signed)
Remote ICD transmission.   

## 2023-06-29 ENCOUNTER — Ambulatory Visit: Payer: Medicaid Other | Admitting: Internal Medicine

## 2023-06-29 ENCOUNTER — Encounter: Payer: Self-pay | Admitting: Internal Medicine

## 2023-06-29 VITALS — BP 86/56 | HR 80 | Temp 98.0°F | Ht 72.0 in | Wt 300.8 lb

## 2023-06-29 DIAGNOSIS — J4489 Other specified chronic obstructive pulmonary disease: Secondary | ICD-10-CM

## 2023-06-29 DIAGNOSIS — I5022 Chronic systolic (congestive) heart failure: Secondary | ICD-10-CM | POA: Diagnosis not present

## 2023-06-29 DIAGNOSIS — G4733 Obstructive sleep apnea (adult) (pediatric): Secondary | ICD-10-CM

## 2023-06-29 DIAGNOSIS — J439 Emphysema, unspecified: Secondary | ICD-10-CM | POA: Diagnosis not present

## 2023-06-29 MED ORDER — BLOOD PRESSURE MONITORING DEVI: 0 refills | Status: DC

## 2023-06-29 MED ORDER — TRELEGY ELLIPTA 100-62.5-25 MCG/ACT IN AEPB: 1.0000 | INHALATION_SPRAY | Freq: Every day | RESPIRATORY_TRACT | Status: DC

## 2023-06-29 MED ORDER — TRELEGY ELLIPTA 100-62.5-25 MCG/ACT IN AEPB: 1.0000 | INHALATION_SPRAY | Freq: Every day | RESPIRATORY_TRACT | 11 refills | Status: DC

## 2023-06-29 NOTE — Progress Notes (Signed)
[name]    [id number]    [birth year]  Primary Care Physician:Johnson, Binnie Rail, MD Date of Appointment: 06/29/2023 Established Patient Visit  Chief complaint:   Chief Complaint  Patient presents with   Follow-up     HPI: [name] is a 58 y.o. man with history of NICM, HFrEF EF 20%, h/o VT s/p ICD placement, and COPD with former tobacco use disorder.   Interval Updates: Here for follow up with for shortness of breath, copd Having episodes of dizziness. Blood pressure is low today.   Having gasping episodes a couple of times a day where he tries to get a deep breath in. Sometimes while laying down.  Has tried symbicort and spiriva and they do not help his breathing. He does think trelegy does help.  He does think trelegy inhaler is helping him once daily. Minimal albuterol use.   1st Cousin has sarcoidosis.  No skin changes.  Has had recurrent kidney stones.  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  Unable to tolerate GLP1 in the past.   Home sleep test shows severe OSA - was recommended and ordered to have cpap titration study. This was not scheduled.   I have reviewed the patient's family social and past medical history and updated as appropriate.   Past Medical History:  Diagnosis Date   AICD (automatic cardioverter/defibrillator) present    Chronic bronchitis (HCC)    "get it most q yr" (12/23/2013)   Chronic systolic (congestive) heart failure (HCC)    Fracture of left humerus    a. 07/2013.   GERD (gastroesophageal reflux disease)    not at present time   Heart murmur    "born w/it"    History of renal calculi    Hypertension    Kidney stones    NICM (nonischemic cardiomyopathy) (HCC)    a. 07/2013 Echo: EF 20-25%, diff HK, Gr2 DD, mild MR, mod dil LA/RA. 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    Shockable heart rhythm detected by automated external  defibrillator    Syncope    Type II diabetes mellitus (HCC) 2006    Past Surgical History:  Procedure Laterality Date   CARDIAC CATHETERIZATION  08/10/13   CHOLECYSTECTOMY  ~ 2010   IMPLANTABLE CARDIOVERTER DEFIBRILLATOR IMPLANT  12/23/2013   STJ Fortify ICD implanted by Dr Johney Frame for cardiomyopathy and syncope   IMPLANTABLE CARDIOVERTER DEFIBRILLATOR IMPLANT N/A 12/23/2013   Procedure: IMPLANTABLE CARDIOVERTER DEFIBRILLATOR IMPLANT;  Surgeon: Gardiner Rhyme, MD;  Location: [facility] CATH LAB;  Service: Cardiovascular;  Laterality: N/A;   INGUINAL HERNIA REPAIR Left 03/01/2018   Procedure: OPEN REPAIR OF LEFT INGUINAL HERNIA WITH MESH;  Surgeon: Gaynelle Adu, MD;  Location: [facility] ORS;  Service: General;  Laterality: Left;   LEAD REVISION/REPAIR N/A 05/15/2022   Procedure: LEAD REVISION/REPAIR;  Surgeon: Lanier Prude, MD;  Location: [facility] INVASIVE CV LAB;  Service: Cardiovascular;  Laterality: N/A;   LEFT HEART CATHETERIZATION WITH CORONARY ANGIOGRAM N/A 08/10/2013   Procedure: LEFT HEART CATHETERIZATION WITH CORONARY ANGIOGRAM;  Surgeon: Corky Crafts, MD;  Location: [facility] CATH LAB;  Service: Cardiovascular;  Laterality: N/A;   ORIF HUMERUS FRACTURE Left 08/12/2013   Procedure: OPEN REDUCTION INTERNAL FIXATION (ORIF) HUMERAL SHAFT FRACTURE;  Surgeon: Nadara Mustard, MD;  Location: [facility] OR;  Service: Orthopedics;  Laterality: Left;  Open Reduction Internal Fixation  Left Humerus   RIGHT/LEFT HEART CATH AND CORONARY ANGIOGRAPHY N/A 05/12/2022   Procedure: RIGHT/LEFT HEART CATH AND CORONARY ANGIOGRAPHY;  Surgeon: Dorthula Nettles, DO;  Location: [facility] INVASIVE CV LAB;  Service: Cardiovascular;  Laterality: N/A;   URETEROSCOPY     "laser for kidney stones"    Family History  Problem Relation Age of Onset   Diabetes Father    Arthritis Father    Hypertension Father    Diabetes Mother    Arthritis Mother    Hypertension Mother    Hypertension Brother     Social History   Occupational History    Occupation: Disabled  Tobacco Use   Smoking status: Former    Current packs/day: 0.00    Average packs/day: 1 pack/day for 28.0 years (28.0 ttl pk-yrs)    Types: Cigarettes    Start date: 06/03/1986    Quit date: 06/03/2014    Years since quitting: 9.0   Smokeless tobacco: Never  Vaping Use   Vaping status: Never Used  Substance and Sexual Activity   Alcohol use: Yes   Drug use: Yes    Types: Marijuana    Comment: occasionally   Sexual activity: Yes     Physical Exam: Blood pressure (!) 82/58, pulse 80, temperature 98 F (36.7 C), temperature source Oral, height 6' (1.829 m), weight (!) 300 lb 12.8 oz (136.4 kg), SpO2 96%.   Gen:      Obese, no distress Lungs:    ctab no wheezes CV:  RRR no mrg Ext: no edema  Data Reviewed: Imaging: I have personally reviewed the chest xray Feb 2024 shows cardiomegaly but clear lungs, no effusion or pna  PFTs:     Latest Ref Rng & Units 10/09/2022    9:05 AM 02/07/2016    1:37 PM  PFT Results  FVC-Pre L 3.13  3.31   FVC-Predicted Pre % 58  71   FVC-Post L 3.25  3.23   FVC-Predicted Post % 60  69   Pre FEV1/FVC % % 56  79   Post FEV1/FCV % % 74  73   FEV1-Pre L 1.74  2.60   FEV1-Predicted Pre % 42  70   FEV1-Post L 2.39  2.36   DLCO uncorrected ml/min/mmHg 23.92  19.77   DLCO UNC% % 77  54   DLCO corrected ml/min/mmHg 23.92    DLCO COR %Predicted % 77    DLVA Predicted % 123  110   TLC L 5.63  5.68   TLC % Predicted % 74  75   RV % Predicted % 109  102    I have personally reviewed the patient's PFTs from May 2024 - severe airflow limitation FEV1 42% of predicted  Labs: Lab Results  Component Value Date   NA 138 05/21/2023   K 4.1 05/21/2023   CO2 23 05/21/2023   GLUCOSE 236 (H) 05/21/2023   BUN 27 (H) 05/21/2023   CREATININE 2.56 (H) 05/21/2023   CALCIUM 9.4 05/21/2023   GFR 32.38 (L) 07/23/2022   EGFR 29 (L) 02/13/2023   GFRNONAA 28 (L) 05/21/2023   Lab Results  Component Value Date   WBC 5.9 05/13/2023    HGB 13.4 05/13/2023   HCT 41.5 05/13/2023   MCV 93.0 05/13/2023   PLT 290 05/13/2023    Immunization status: Immunization History  Administered Date(s) Administered   PFIZER(Purple Top)SARS-COV-2 Vaccination 10/21/2019    External Records Personally Reviewed: cardiology, pulmonary, pcp  Assessment:  COPD Severe OSA, not on CPAP  NICM with chronic HFrEF and h/o VT s/p ICD placement EF 25% History of tobacco use disorder, quit 2015  Plan/Recommendations: Upon closer review of the pfts done May 2024 - he failed to meet peak expiratory flow in one second and thus I feel his COPD is not as severe as initially thought. However he certainly has smoking related lung disease.  Has tried and failed - advair, stiolto, anoro, symbicort, spiriva, breztri. Trelegy is the only inhaler that works for him.   I have sent a BP cuff to your pharmacy. Please check your blood pressure daily and write it down to show your heart doctors. I will reach out to them about your blood pressure to see if they need to make any medication changes.   For now - Will give Continue trelegy inhaler once daily, and albuterol as needed. Samples provided.   Please schedule your sleep study in lab. Treating severe sleep apnea will help a lot of your symptoms as well.    Return to Care: Return in about 6 months (around 12/27/2023).    Durel Salts, MD Pulmonary and Critical Care Medicine [facility] Office:[phone]

## 2023-06-29 NOTE — Addendum Note (Signed)
Addended by: Delrae Rend on: 06/29/2023 04:42 PM   Modules accepted: Orders

## 2023-06-29 NOTE — Patient Instructions (Addendum)
It was a pleasure to see you today!  Please schedule follow up scheduled with myself in 6 months.  If my schedule is not open yet, we will contact you with a reminder closer to that time. Please call [phone] if you haven't heard from [place] a month before, and always call us sooner if issues or concerns arise. You can also send [place] a message through MyChart, but but aware that this is not to be used for urgent issues and it may take up to 5-7 days to receive a reply. Please be aware that you will likely be able to view your results before I have a chance to respond to them. Please give [place] 5 business days to respond to any non-urgent results.   I have sent a BP cuff to your pharmacy. Please check your blood pressure daily and write it down to show your heart doctors. I will reach out to them about your blood pressure to see if they need to make any medication changes.   Will give Continue trelegy inhaler once daily, and albuterol as needed.   Please schedule your sleep study in lab. Treating severe sleep apnea will help a lot of your symptoms as well.

## 2023-06-30 ENCOUNTER — Inpatient Hospital Stay: Payer: Medicaid Other

## 2023-06-30 ENCOUNTER — Inpatient Hospital Stay: Payer: Medicaid Other | Attending: Hematology and Oncology | Admitting: Hematology and Oncology

## 2023-06-30 ENCOUNTER — Ambulatory Visit (HOSPITAL_COMMUNITY): Payer: Medicaid Other | Admitting: Psychiatry

## 2023-06-30 VITALS — BP 116/65 | HR 73 | Temp 97.9°F | Resp 18 | Wt 301.4 lb

## 2023-06-30 DIAGNOSIS — Z87891 Personal history of nicotine dependence: Secondary | ICD-10-CM | POA: Insufficient documentation

## 2023-06-30 DIAGNOSIS — Z7901 Long term (current) use of anticoagulants: Secondary | ICD-10-CM | POA: Insufficient documentation

## 2023-06-30 DIAGNOSIS — E1122 Type 2 diabetes mellitus with diabetic chronic kidney disease: Secondary | ICD-10-CM | POA: Diagnosis not present

## 2023-06-30 DIAGNOSIS — I1310 Hypertensive heart and chronic kidney disease without heart failure, with stage 1 through stage 4 chronic kidney disease, or unspecified chronic kidney disease: Secondary | ICD-10-CM | POA: Diagnosis not present

## 2023-06-30 DIAGNOSIS — N184 Chronic kidney disease, stage 4 (severe): Secondary | ICD-10-CM | POA: Insufficient documentation

## 2023-06-30 DIAGNOSIS — Z79899 Other long term (current) drug therapy: Secondary | ICD-10-CM | POA: Diagnosis not present

## 2023-06-30 DIAGNOSIS — I82622 Acute embolism and thrombosis of deep veins of left upper extremity: Secondary | ICD-10-CM

## 2023-06-30 DIAGNOSIS — I82629 Acute embolism and thrombosis of deep veins of unspecified upper extremity: Secondary | ICD-10-CM | POA: Diagnosis not present

## 2023-06-30 MED ORDER — APIXABAN 5 MG PO TABS: 5.0000 mg | ORAL_TABLET | Freq: Two times a day (BID) | ORAL | 3 refills | Status: DC

## 2023-06-30 NOTE — Progress Notes (Signed)
[facility] CONSULT NOTE  Patient Care Team: Marcine Matar, MD as PCP - General (Internal Medicine) Jens Som Madolyn Frieze, MD as PCP - Cardiology (Cardiology) Lanier Prude, MD as PCP - Electrophysiology (Cardiology) Shaune Leeks as Social Worker  CHIEF COMPLAINTS/PURPOSE OF CONSULTATION:  LU extremity DVT  ASSESSMENT & PLAN:   Upper Extremity Deep Vein Thrombosis (UEDVT) First episode of UEDVT in November, possibly provoked by IV access during hospitalization in October, uncertain which arm was used. He however was admitted and required IV abx. Persistent arm swelling and discomfort, suggesting the clot may not have completely resolved. -Continue current anticoagulation therapy. -Order an ultrasound of the left arm in April to assess clot resolution. -At this time we have recommended up to 6 months of anticoagulation. -Now if he needs anticoagulation long term from cardiac stand point, I will leave this up to the primary cardiology team.  Chronic Kidney Disease (CKD) Stage 4 No new changes or complications reported. -Continue current management.  Hypertension and Diabetes Chronic conditions, no new changes or complications reported. -Continue current management.  Cellulitis (groin) Resolved with IV antibiotics during hospitalization in October. -No further action required at this time.   General Health Maintenance -Refill anticoagulation medication to last until next appointment in May. -Schedule follow-up appointment in May.   HISTORY OF PRESENTING ILLNESS:  [name] 58 y.o. male is here because of left upper extremity DVT  Discussed the use of AI scribe software for clinical note transcription with the patient, who gave verbal consent to proceed.  History of Present Illness    The patient, with a history of hypertension, diabetes, chronic kidney disease stage four, and heart disease, presents for follow-up after a blood clot in his arm  in November. He denies any overwork, injury, or procedures that could have triggered the clot. The patient noticed swelling and pain in the arm, which he initially dismissed due to pre-existing chest aches and pains. However, the swelling progressed to the point where he could not wear his watch or jewelry, prompting him to seek medical attention.  In addition to the blood clot, the patient has had other significant health events in the past six months. He was hospitalized for cellulitis in the groin area in October, during which he likely had an IV line placed although he doesn't remember which arm was used. The patient denies having recent history of COVID-19.  The patient's brother has had blood clots in his leg and lungs in his fifties. The patient has had multiple heart surgeries in the past but never had a blood clot during these procedures.   The patient's arm remains swollen despite being on a blood thinner since mid-November. The swelling is not worse than when he went to the hospital, but it has not improved significantly. The patient sleeps on the affected arm, which may be contributing to the persistent swelling.  He also complains of a mild chest discomfort on the left side but no overt SOB compared to his baseline.  Rest of the pertinent ROS reviewed and neg.  MEDICAL HISTORY:  Past Medical History:  Diagnosis Date   AICD (automatic cardioverter/defibrillator) present    Chronic bronchitis (HCC)    "get it most q yr" (12/23/2013)   Chronic systolic (congestive) heart failure (HCC)    Fracture of left humerus    a. 07/2013.   GERD (gastroesophageal reflux disease)    not at present time   Heart murmur    "born  w/it"    History of renal calculi    Hypertension    Kidney stones    NICM (nonischemic cardiomyopathy) (HCC)    a. 07/2013 Echo: EF 20-25%, diff HK, Gr2 DD, mild MR, mod dil LA/RA. 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    Shockable heart rhythm detected by automated external defibrillator    Syncope    Type II diabetes mellitus (HCC) 2006    SURGICAL HISTORY: Past Surgical History:  Procedure Laterality Date   CARDIAC CATHETERIZATION  08/10/13   CHOLECYSTECTOMY  ~ 2010   IMPLANTABLE CARDIOVERTER DEFIBRILLATOR IMPLANT  12/23/2013   STJ Fortify ICD implanted by Dr Johney Frame for cardiomyopathy and syncope   IMPLANTABLE CARDIOVERTER DEFIBRILLATOR IMPLANT N/A 12/23/2013   Procedure: IMPLANTABLE CARDIOVERTER DEFIBRILLATOR IMPLANT;  Surgeon: Gardiner Rhyme, MD;  Location: [facility] CATH LAB;  Service: Cardiovascular;  Laterality: N/A;   INGUINAL HERNIA REPAIR Left 03/01/2018   Procedure: OPEN REPAIR OF LEFT INGUINAL HERNIA WITH MESH;  Surgeon: Gaynelle Adu, MD;  Location: [facility] ORS;  Service: General;  Laterality: Left;   LEAD REVISION/REPAIR N/A 05/15/2022   Procedure: LEAD REVISION/REPAIR;  Surgeon: Lanier Prude, MD;  Location: [facility] INVASIVE CV LAB;  Service: Cardiovascular;  Laterality: N/A;   LEFT HEART CATHETERIZATION WITH CORONARY ANGIOGRAM N/A 08/10/2013   Procedure: LEFT HEART CATHETERIZATION WITH CORONARY ANGIOGRAM;  Surgeon: Corky Crafts, MD;  Location: [facility] CATH LAB;  Service: Cardiovascular;  Laterality: N/A;   ORIF HUMERUS FRACTURE Left 08/12/2013   Procedure: OPEN REDUCTION INTERNAL FIXATION (ORIF) HUMERAL SHAFT FRACTURE;  Surgeon: Nadara Mustard, MD;  Location: [facility] OR;  Service: Orthopedics;  Laterality: Left;  Open Reduction Internal Fixation Left Humerus   RIGHT/LEFT HEART CATH AND CORONARY ANGIOGRAPHY N/A 05/12/2022   Procedure: RIGHT/LEFT HEART CATH AND CORONARY ANGIOGRAPHY;  Surgeon: Dorthula Nettles, DO;  Location: [facility] INVASIVE CV LAB;  Service: Cardiovascular;  Laterality: N/A;   URETEROSCOPY     "laser for kidney stones"    SOCIAL HISTORY: Social History   Socioeconomic History   Marital status: Single    Spouse name: Not on file   Number of children: Not on file    Years of education: Not on file   Highest education level: Not on file  Occupational History   Occupation: Disabled  Tobacco Use   Smoking status: Former    Current packs/day: 0.00    Average packs/day: 1 pack/day for 28.0 years (28.0 ttl pk-yrs)    Types: Cigarettes    Start date: 06/03/1986    Quit date: 06/03/2014    Years since quitting: 9.0   Smokeless tobacco: Never  Vaping Use   Vaping status: Never Used  Substance and Sexual Activity   Alcohol use: Yes   Drug use: Yes    Types: Marijuana    Comment: occasionally   Sexual activity: Yes  Other Topics Concern   Not on file  Social History Narrative   Pt lives with his brother    Social Drivers of Corporate investment banker Strain: Low Risk  (05/25/2023)   Overall Financial Resource Strain (CARDIA)    Difficulty of Paying Living Expenses: Not hard at all  Recent Concern: Financial Resource Strain - High Risk (03/24/2023)   Overall Financial Resource Strain (CARDIA)    Difficulty of Paying Living Expenses: Hard  Food Insecurity: No Food Insecurity (05/25/2023)   Hunger Vital Sign    Worried About Running  Out of Food in the Last Year: Never true    Ran Out of Food in the Last Year: Never true  Transportation Needs: No Transportation Needs (05/25/2023)   PRAPARE - Administrator, Civil Service (Medical): No    Lack of Transportation (Non-Medical): No  Physical Activity: Inactive (05/25/2023)   Exercise Vital Sign    Days of Exercise per Week: 0 days    Minutes of Exercise per Session: 0 min  Stress: Stress Concern Present (05/25/2023)   [employer] of Occupational Health - Occupational Stress Questionnaire    Feeling of Stress : Rather much  Social Connections: Moderately Integrated (05/25/2023)   Social Connection and Isolation Panel [NHANES]    Frequency of Communication with Friends and Family: More than three times a week    Frequency of Social Gatherings with Friends and Family: More than  three times a week    Attends Religious Services: More than 4 times per year    Active Member of [employer] or Organizations: Yes    Attends Banker Meetings: 1 to 4 times per year    Marital Status: Never married  Intimate Partner Violence: Not At Risk (05/25/2023)   Humiliation, Afraid, Rape, and Kick questionnaire    Fear of Current or Ex-Partner: No    Emotionally Abused: No    Physically Abused: No    Sexually Abused: No    FAMILY HISTORY: Family History  Problem Relation Age of Onset   Diabetes Father    Arthritis Father    Hypertension Father    Diabetes Mother    Arthritis Mother    Hypertension Mother    Hypertension Brother     ALLERGIES:  is allergic to bee venom and trulicity [dulaglutide].  MEDICATIONS:  Current Outpatient Medications  Medication Sig Dispense Refill   Accu-Chek Softclix Lancets lancets Use as instructed 100 each 12   acetaminophen (TYLENOL) 500 MG tablet Take 1,000 mg by mouth every 8 (eight) hours as needed.     albuterol (VENTOLIN HFA) 108 (90 Base) MCG/ACT inhaler Inhale 2 puffs into the lungs every 6 (six) hours as needed for wheezing or shortness of breath. 18 g 2   amiodarone (PACERONE) 200 MG tablet Take 1 tablet (200 mg total) by mouth daily. 90 tablet 3   apixaban (ELIQUIS) 5 MG TABS tablet Take 1 tablet (5 mg total) by mouth 2 (two) times daily. 60 tablet 2   atorvastatin (LIPITOR) 10 MG tablet Take 1 tablet (10 mg total) by mouth daily. 90 tablet 1   Blood Glucose Monitoring Suppl (ACCU-CHEK GUIDE) w/Device KIT UAD 1 kit 0   Blood Glucose Monitoring Suppl DEVI 1 each by Does not apply route in the morning, at noon, and at bedtime. May substitute to any manufacturer covered by patient's insurance. 1 each 0   Blood Pressure Monitoring DEVI Dispense 1 bp monitor, check daily. 1 each 0   cetirizine (EQ ALLERGY RELIEF, CETIRIZINE,) 10 MG tablet Take 1 tablet (10 mg total) by mouth daily. (Patient taking differently: Take 10 mg by  mouth in the morning.) 90 tablet 1   cholecalciferol (VITAMIN D3) 25 MCG (1000 UNIT) tablet Take 1,000 Units by mouth in the morning.     clonazePAM (KLONOPIN) 0.5 MG tablet Take 1 tablet (0.5 mg total) by mouth 2 (two) times daily as needed for anxiety. 60 tablet 1   Clotrimazole 1 % OINT Apply to the affected area twice a day 56.7 g 0   Continuous Glucose  Receiver (FREESTYLE LIBRE 2 READER) DEVI USE AS DIRECTED 1 each 0   Continuous Glucose Sensor (FREESTYLE LIBRE 2 SENSOR) MISC Use to check blood sugar TID. Change sensor once every 14 days. E11.65 2 each 11   EPINEPHrine 0.3 mg/0.3 mL IJ SOAJ injection Inject 0.3 mg into the muscle as needed. 1 each 1   famotidine (PEPCID) 20 MG tablet TAKE 1 TABLET BY MOUTH 1 TO 2 TIMES DAILY AS NEEDED (Patient taking differently: Take 20 mg by mouth See admin instructions. TAKE 1 TABLET BY MOUTH 1 TO 2 TIMES DAILY AS NEEDED) 180 tablet 0   fluticasone (FLONASE) 50 MCG/ACT nasal spray Use 1-2 sprays in each nostril once a day as needed for nasal congestion. 16 g 5   Fluticasone-Umeclidin-Vilant (TRELEGY ELLIPTA) 100-62.5-25 MCG/ACT AEPB Inhale 1 puff into the lungs daily. 1 each 11   Fluticasone-Umeclidin-Vilant (TRELEGY ELLIPTA) 100-62.5-25 MCG/ACT AEPB Inhale 1 puff into the lungs daily.     gabapentin (NEURONTIN) 300 MG capsule Take 1 capsule (300 mg total) by mouth at bedtime. 90 capsule 1   glucose blood (ACCU-CHEK GUIDE) test strip Use as instructed 100 each 12   glucose blood (FREESTYLE LITE) test strip Use as instructed.  Please make sure this is compatible with [facility] 2 reader. 100 each 12   insulin aspart (NOVOLOG) 100 UNIT/ML injection Inject 24 Units into the skin 3 (three) times daily with meals.     insulin glargine (LANTUS SOLOSTAR) 100 UNIT/ML Solostar Pen Inject 42 Units into the skin daily. (Patient taking differently: Inject 36 Units into the skin daily.)     Insulin Pen Needle (B-D ULTRAFINE III SHORT PEN) 31G X 8 MM MISC USE AS DIRECTED 100  each 6   Lancet Devices (ACCU-CHEK SOFTCLIX) lancets 1 each by Other route 3 (three) times daily. 1 each 0   methocarbamol (ROBAXIN) 500 MG tablet Take 1 tablet (500 mg total) by mouth daily. (Patient taking differently: Take 500 mg by mouth at bedtime.) 90 tablet 1   metolazone (ZAROXOLYN) 5 MG tablet Take 1 tablet (5 mg total) by mouth once a week.     metoprolol succinate (TOPROL-XL) 50 MG 24 hr tablet Take 1 tablet (50 mg total) by mouth daily. (Patient taking differently: Take 50 mg by mouth in the morning.) 90 tablet 3   nystatin (MYCOSTATIN/NYSTOP) powder Apply topically 3 (three) times daily. 15 g 0   potassium chloride SA (KLOR-CON M) 20 MEQ tablet Take 2 tablets (40 mEq total) by mouth daily. 60 tablet 3   sacubitril-valsartan (ENTRESTO) 49-51 MG Take 1 tablet by mouth 2 (two) times daily. 60 tablet 3   sertraline (ZOLOFT) 50 MG tablet Take 1.5 tablets (75 mg total) by mouth in the morning. 45 tablet 2   tamsulosin (FLOMAX) 0.4 MG CAPS capsule Take 2 capsules (0.8 mg total) by mouth daily. (Patient taking differently: Take 0.8 mg by mouth in the morning.) 180 capsule 1   torsemide (DEMADEX) 20 MG tablet Take 4 tablets (80 mg total) by mouth 2 (two) times daily. 180 tablet 3   No current facility-administered medications for this visit.     PHYSICAL EXAMINATION: ECOG PERFORMANCE STATUS: 2 - Symptomatic, <50% confined to bed  Vitals:   06/30/23 1516  BP: 116/65  Pulse: 73  Resp: 18  Temp: 97.9 F (36.6 C)  SpO2: 94%   Filed Weights   06/30/23 1516  Weight: (!) 301 lb 6.4 oz (136.7 kg)    GENERAL:alert, no distress and comfortable CHEST: Scattered  wheezing noted on auscultation. EXTREMITIES: Left arm exhibits swelling, significantly larger than the right arm.  LABORATORY DATA:  I have reviewed the data as listed Lab Results  Component Value Date   WBC 5.9 05/13/2023   HGB 13.4 05/13/2023   HCT 41.5 05/13/2023   MCV 93.0 05/13/2023   PLT 290 05/13/2023      Chemistry      Component Value Date/Time   NA 138 05/21/2023 1205   NA 142 02/13/2023 1658   K 4.1 05/21/2023 1205   CL 101 05/21/2023 1205   CO2 23 05/21/2023 1205   BUN 27 (H) 05/21/2023 1205   BUN 26 (H) 02/13/2023 1658   CREATININE 2.56 (H) 05/21/2023 1205   CREATININE 1.68 (H) 04/24/2016 1502      Component Value Date/Time   CALCIUM 9.4 05/21/2023 1205   ALKPHOS 48 05/13/2023 1526   AST 16 05/13/2023 1526   ALT 18 05/13/2023 1526   BILITOT 0.6 05/13/2023 1526   BILITOT 0.4 08/19/2022 1527       RADIOGRAPHIC STUDIES: I have personally reviewed the radiological images as listed and agreed with the findings in the report. No results found.  All questions were answered. The patient knows to call the clinic with any problems, questions or concerns. I spent 45 minutes in the care of this patient including H and P, review of records, counseling and coordination of care.     Rachel Moulds, MD 06/30/2023 3:18 PM

## 2023-07-02 ENCOUNTER — Other Ambulatory Visit (HOSPITAL_COMMUNITY): Payer: Self-pay

## 2023-07-05 ENCOUNTER — Encounter: Payer: Self-pay | Admitting: Physical Medicine and Rehabilitation

## 2023-07-06 ENCOUNTER — Telehealth: Payer: Self-pay

## 2023-07-06 ENCOUNTER — Other Ambulatory Visit (HOSPITAL_COMMUNITY): Payer: Self-pay

## 2023-07-06 NOTE — Progress Notes (Signed)
PA request has been Submitted. New Encounter created for follow up. For additional info see Pharmacy Prior Auth telephone encounter from 01/27.

## 2023-07-06 NOTE — Telephone Encounter (Signed)
*  Pulm  Pharmacy Patient Advocate Encounter   Received notification from Physician's Office that prior authorization for Trelegy Ellipta 100-62.5-25MCG/ACT aerosol powder  is required/requested.   Insurance verification completed.   The patient is insured through [facility] .   Per test claim: PA required; PA submitted to above mentioned insurance via CoverMyMeds Key/confirmation #/EOC [id number] Status is pending

## 2023-07-07 ENCOUNTER — Other Ambulatory Visit (HOSPITAL_COMMUNITY): Payer: Self-pay

## 2023-07-07 ENCOUNTER — Ambulatory Visit (INDEPENDENT_AMBULATORY_CARE_PROVIDER_SITE_OTHER): Payer: Medicaid Other | Admitting: Physical Medicine and Rehabilitation

## 2023-07-07 ENCOUNTER — Other Ambulatory Visit: Payer: Self-pay

## 2023-07-07 DIAGNOSIS — M461 Sacroiliitis, not elsewhere classified: Secondary | ICD-10-CM | POA: Diagnosis not present

## 2023-07-07 NOTE — Telephone Encounter (Signed)
Pharmacy Patient Advocate Encounter  Received notification from [facility] that Prior Authorization for Trelegy Ellipta 100-62.5-25MCG/ACT aerosol powder  has been APPROVED from 07/06/2023 to 07/05/2024. Ran test claim, Copay is $4.00. This test claim was processed through [facility]- copay amounts may vary at other pharmacies due to pharmacy/plan contracts, or as the patient moves through the different stages of their insurance plan.

## 2023-07-07 NOTE — Patient Instructions (Signed)
[facility] Physiatry Discharge Instructions  *At any time if you have questions or concerns they can be answered by calling [phone]  All Patients: You may experience an increase in your symptoms for the first 2 days (it can take 2 days to 2 weeks for the steroid/cortisone to have its maximal effect). You may use ice to the site for the first 24 hours; 20 minutes on and 20 minutes off and may use heat after that time. You may resume and continue your current pain medications. If you need a refill please contact the prescribing physician. You may resume your medications if any were stopped for the procedure. You may shower but no swimming, tub bath or Jacuzzi for 24 hours. Please remove bandage after 4 hours. You may resume light activities as tolerated. If you had Spine Injection, you should not drive for the next 3 hours due to anesthetics used in the procedure. Please have someone drive for you.  *If you have had sedation, Valium, Xanax, or lorazepam: Do not drive or use public transportation for 24 hours, do not operating hazardous machinery or make important personal/business decisions for 24 hours.  POSSIBLE STEROID SIDE EFFECTS: If experienced these should only last for a short period. Change in menstrual flow  Edema in (swelling)  Increased appetite Skin flushing (redness)  Skin rash/acne  Thrush (oral) Vaginitis    Increased sweating  Depression Increased blood glucose levels Cramping and leg/calf  Euphoria (feeling happy)  POSSIBLE PROCEDURE SIDE EFFECTS: Please call our office if concerned. Increased pain Increased numbness/tingling  Headache Nausea/vomiting Hematoma (bruising/bleeding) Edema (swelling at the site) Weakness  Infection (red/drainage at site) Fever greater than 100.14F  *In the event of a headache after epidural steroid injection: Drink plenty of fluids, especially water and try to lay flat when possible. If the headache does not get better after a few days  or as always if concerned please call the office.

## 2023-07-09 ENCOUNTER — Ambulatory Visit: Payer: Medicaid Other | Attending: Internal Medicine | Admitting: Internal Medicine

## 2023-07-09 ENCOUNTER — Encounter: Payer: Self-pay | Admitting: Internal Medicine

## 2023-07-09 DIAGNOSIS — J40 Bronchitis, not specified as acute or chronic: Secondary | ICD-10-CM | POA: Diagnosis not present

## 2023-07-09 DIAGNOSIS — M25462 Effusion, left knee: Secondary | ICD-10-CM | POA: Diagnosis not present

## 2023-07-09 DIAGNOSIS — Z2821 Immunization not carried out because of patient refusal: Secondary | ICD-10-CM

## 2023-07-09 DIAGNOSIS — I5022 Chronic systolic (congestive) heart failure: Secondary | ICD-10-CM | POA: Diagnosis not present

## 2023-07-09 DIAGNOSIS — F411 Generalized anxiety disorder: Secondary | ICD-10-CM | POA: Diagnosis not present

## 2023-07-09 DIAGNOSIS — B356 Tinea cruris: Secondary | ICD-10-CM

## 2023-07-09 DIAGNOSIS — Z6841 Body Mass Index (BMI) 40.0 and over, adult: Secondary | ICD-10-CM | POA: Diagnosis not present

## 2023-07-09 DIAGNOSIS — E66813 Obesity, class 3: Secondary | ICD-10-CM | POA: Diagnosis not present

## 2023-07-09 DIAGNOSIS — I82722 Chronic embolism and thrombosis of deep veins of left upper extremity: Secondary | ICD-10-CM | POA: Diagnosis not present

## 2023-07-09 DIAGNOSIS — I428 Other cardiomyopathies: Secondary | ICD-10-CM | POA: Diagnosis not present

## 2023-07-09 DIAGNOSIS — E1169 Type 2 diabetes mellitus with other specified complication: Secondary | ICD-10-CM | POA: Diagnosis not present

## 2023-07-09 LAB — POCT GLYCOSYLATED HEMOGLOBIN (HGB A1C): Hemoglobin A1C: 8.9 % — AB (ref 4.0–5.6)

## 2023-07-09 LAB — GLUCOSE, POCT (MANUAL RESULT ENTRY): POC Glucose: 205 mg/dL — AB (ref 70–99)

## 2023-07-09 MED ORDER — DOXYCYCLINE HYCLATE 100 MG PO TABS: 100.0000 mg | ORAL_TABLET | Freq: Two times a day (BID) | ORAL | 0 refills | Status: DC

## 2023-07-09 MED ORDER — AZITHROMYCIN 250 MG PO TABS: ORAL_TABLET | ORAL | 0 refills | Status: DC

## 2023-07-09 MED ORDER — BUPIVACAINE HCL 0.5 % IJ SOLN
3.0000 mL | INTRAMUSCULAR | Status: AC | PRN
  Administered 2023-07-07: 3 mL via INTRA_ARTICULAR

## 2023-07-09 MED ORDER — METHYLPREDNISOLONE ACETATE 40 MG/ML IJ SUSP
40.0000 mg | INTRAMUSCULAR | Status: AC | PRN
  Administered 2023-07-07: 40 mg via INTRA_ARTICULAR

## 2023-07-09 NOTE — Progress Notes (Signed)
[name] - 58 y.o. male MRN [id number]  Date of birth: February 15, 1966  Office Visit Note: Visit Date: 07/07/2023 PCP: Marcine Matar, MD Referred by: Marcine Matar, MD  Subjective: Chief Complaint  Patient presents with   Lower Back - Pain   HPI:  [name] is a 58 y.o. male who comes in today at the request of Ellin Goodie, FNP for planned Bilateral anesthetic Sacroiliac joint arthrogram with fluoroscopic guidance.  The patient has failed conservative care including home exercise, medications, time and activity modification.  This injection will be diagnostic and hopefully therapeutic.  Please see requesting physician notes for further details and justification.   Positive Fortin finger sign, Patrick's testing and lateral compression test.    ROS Otherwise per HPI.  Assessment & Plan: Visit Diagnoses:    ICD-10-CM   1. Sacroiliitis (HCC)  M46.1 XR C-ARM NO REPORT      Plan: No additional findings.   Meds & Orders: No orders of the defined types were placed in this encounter.   Orders Placed This Encounter  Procedures   Sacroiliac Joint Inj   XR C-ARM NO REPORT    Follow-up: Return if symptoms worsen or fail to improve.   Procedures: Bilateral Sacroiliac Joint Inj on 07/07/2023 2:30 PM Indications: pain and diagnostic evaluation Details: 22 G 3.5 in needle, fluoroscopy-guided posterior approach Medications (Right): 40 mg methylPREDNISolone acetate 40 MG/ML; 3 mL bupivacaine 0.5 % Medications (Left): 40 mg methylPREDNISolone acetate 40 MG/ML; 3 mL bupivacaine 0.5 % Outcome: tolerated well, no immediate complications  Sacroiliac Joint Intra-Articular Injection - Posterior Approach with Fluoroscopic Guidance   Position: PRONE  Additional Comments: Vital signs were monitored before and after the procedure. Patient was prepped and draped in the usual sterile fashion. The correct patient, procedure, and site was verified.   Injection Procedure  Details:   Location/Site:  Sacroiliac joint  Needle size: 3.5 in Spinal Needle  Needle type: Spinal  Needle Placement: Intra-articular  Findings:  -Comments: There was excellent flow of contrast producing a partial arthrogram of the sacroiliac joint.   Procedure Details: Starting with a 90 degree vertical and midline orientation the fluoroscope was tilted cranially 20 to 25 degrees and the target area of the inferior most part of the SI joint on the side mentioned above was visualized.  The soft tissues overlying this target were infiltrated with 4 ml. of 1% Lidocaine without Epinephrine. A #22 gauge spinal needle was inserted perpendicular to the fluoroscope table and advanced into the posterior inferior joint space using fluoroscopic guidance.  Position in the joint space was confirmed by obtaining a partial arthrogram using a 2 ml. volume of Isovue-250 contrast agent. After negative aspirate for gross pus or blood, the injectate was delivered to the joint. Radiographs were obtained for documentation purposes.   Additional Comments:   Dressing: Bandaid    Post-procedure details: Patient was observed during the procedure. Post-procedure instructions were reviewed.  Patient left the clinic in stable condition.    There was excellent flow of contrast producing a partial arthrogram of the sacroiliac joint.  Procedure, treatment alternatives, risks and benefits explained, specific risks discussed. Consent was given by the patient. Immediately prior to procedure a time out was called to verify the correct patient, procedure, equipment, support staff and site/side marked as required. Patient was prepped and draped in the usual sterile fashion.          Clinical History: CLINICAL DATA:  Low back pain, symptoms persist  with > 6 wks treatment   EXAM: CT LUMBAR SPINE WITHOUT CONTRAST   TECHNIQUE: Multidetector CT imaging of the lumbar spine was performed without intravenous  contrast administration. Multiplanar CT image reconstructions were also generated.   RADIATION DOSE REDUCTION: This exam was performed according to the departmental dose-optimization program which includes automated exposure control, adjustment of the mA and/or kV according to patient size and/or use of iterative reconstruction technique.   COMPARISON:  None Available.   FINDINGS: Segmentation: 5 rib-bearing vertebral bodies.   Alignment: No substantial sagittal subluxation.   Vertebrae: Vertebral body heights are maintained.   Paraspinal and other soft tissues: Aorta bi-iliac calcific atherosclerosis.   Disc levels: Moderate multilevel degenerative change including degenerative disc height loss and endplate. At L2-L3 there is disc bulging with right subarticular disc protrusion which results in canal stenosis and right greater than left subarticular recess stenosis with potential for impingement. Additional disc protrusion at L4-L5 also narrows the canal. Lower lumbar facet arthropathy.   IMPRESSION: 1. At L2-L3, disc bulging with right subarticular disc protrusion that results in canal stenosis and right greater than left subarticular recess stenosis with the potential for impingement. An MRI could better assess the canal and foramina if clinically warranted. 2. Moderate multilevel degenerative disease and lower lumbar facet arthropathy. 3. No acute fracture or malalignment.     Electronically Signed   By: Feliberto Harts M.D.   On: 06/14/2023 21:32     Objective:  VS:  HT:    WT:   BMI:     BP:   HR: bpm  TEMP: ( )  RESP:  Physical Exam   Imaging: No results found.

## 2023-07-09 NOTE — Patient Instructions (Signed)
Increase Lantus insulin to 38 units daily.  Continue to work on improving her eating habits.  Stop the Vaseline in the groin area.  Prescription sent to your pharmacy for the antibiotic called doxycycline to take twice a day for 7 days.

## 2023-07-09 NOTE — Progress Notes (Signed)
Cellulitis and cough for less then a week.

## 2023-07-09 NOTE — Progress Notes (Signed)
Patient ID: MOUHAMADOU GITTLEMAN, male    DOB: 21-Jun-1965  MRN: [id number]  CC: Medical Management of Chronic Issues   Subjective: [name] is a 58 y.o. male who presents for chronic ds management. 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    Concern about  cellulitis in the groin area.  He states that he still gets itching of the scrotum and surrounding area occasionally and sometimes there is an odor.  Denies any pain, swelling or redness.  He is using a medicated powder that he gets from [employer] called Micro-guard which is 2% miconazole.  He applies this twice a day.  He also applies Vaseline.  C/o Cough productive of thick brown phlegm and nasal/chest congestion x 2-3 days.   No fever or sore throat.  Always SOB.  Saw pulmonary Dr. Celine Mans on 06/29/2023.  He was continued on Trelegy inhaler.  DM: Results for orders placed or performed in visit on 07/09/23  POCT glucose (manual entry)   Collection Time: 07/09/23  3:46 PM  Result Value Ref Range   POC Glucose 205 (A) 70 - 99 mg/dl  POCT glycosylated hemoglobin (Hb A1C)   Collection Time: 07/09/23  3:49 PM  Result Value Ref Range   Hemoglobin A1C 8.9 (A) 4.0 - 5.6 %   HbA1c POC (<> result, manual entry)     HbA1c, POC (prediabetic range)     HbA1c, POC (controlled diabetic range)     *Note: Due to a large number of results and/or encounters for the requested time period, some results have not been displayed. A complete set of results can be found in Results Review.  Reports being on Lantus 34 daily and Nov 28 TID with meals He tells me that his blood sugars have been high in 200-300 range.  He has his CGM with him.  This shows that over the past month he has been  above target range 70% of the times.  In the last 2 weeks he has been above target range 83% of the times and in range only 17% of the times. -He admits that his eating habits are not the best but have improved some.  He has cut back on sugary drinks.  He now puts 4 tablespoons of cranberry juice and a glass of water instead of drinking the straight tubes. -He has seen nutritionist in the past.  Does not feel that seeing a nutritionist again would help.  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  Reports chest pains and palpitations at times which she also states are chronic and have not increased.  No lower extremity edema -Reports compliance with medications including Zaroxolyn 5 mg weekly, torsemide 80 mg twice a day, potassium 40 mEq daily, Entresto 49/51 mg 1 tablet twice a day, amiodarone 20 mg daily, atorvastatin 10 mg daily and metoprolol XL 50 mg daily.  Continues to be followed by cardiology  DVT left upper extremity: He is seen hematology oncology Dr. Al Pimple.  She recommends treatment for at least 6 months and can be extended further if cardiology deems necessary.  Also recommends repeating  the ultrasound in April.  Patient still on Eliquis.  No bruising or bleeding on the medicine.  On last visit he reported pain and swelling in the left knee.  He continues to have it.  I had ordered an x-ray of the knee but patient states when he got there they told him there was not an order in the system.  He has been seeing orthopedics and Dr. Alvester Morin for his lower back pain.  Had CT of the lumbar spine in December.  This revealed bulging disks at L2-L3 resulting in canal stenosis and right greater than left subarticular recess stenosis, moderate multilevel degenerative disease and lower lumbar facet arthropathy.  Tells me that he had another epidural  injection yesterday and the back is a little sore.  Patient Active Problem List   Diagnosis Date Noted   Acute deep vein thrombosis (DVT) of left upper extremity (HCC) 04/24/2023   Cellulitis of perineum 04/02/2023   Heart failure (HCC) 02/28/2023   Class 3 obesity 02/28/2023   SOB (shortness of breath) 02/27/2023   Loud snoring 10/28/2022   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   Hyperlipidemia associated with type 2 diabetes mellitus (HCC) 09/18/2022   PTSD (post-traumatic stress disorder) 07/10/2022   Panic attack 06/17/2022   Passive suicidal ideations 06/17/2022   History of ventricular tachycardia 06/14/2022   Essential hypertension 06/14/2022   Stage 3b chronic kidney disease (HCC) 05/15/2022   Acute gout due to renal impairment involving left ankle 05/07/2022   Acute gout due to renal impairment involving right knee 05/07/2022   Hyponatremia 05/07/2022   Diarrhea 05/06/2022   Hyperglycemia 05/06/2022   Cellulitis of buttock 05/06/2022   Acute renal failure superimposed on stage 3b chronic kidney disease (HCC) 05/06/2022   Hypokalemia 05/05/2022   Rectal abscess 05/05/2022   Left buttock abscess 05/05/2022   Cardiac arrest (HCC) 05/03/2022   AKI (acute kidney injury) (HCC) 05/03/2022   V-tach (HCC) 05/03/2022   CKD (chronic kidney disease) stage 2, GFR 60-89 ml/min 10/24/2019   Acute non-recurrent frontal sinusitis 06/27/2019   COVID-19 virus infection 06/27/2019   Glaucoma suspect 12/22/2018   Seasonal and perennial allergic rhinoconjunctivitis 10/25/2018   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   Urticaria 05/03/2018   S/P inguinal hernia repair 03/01/2018   Unilateral inguinal hernia without obstruction or gangrene 07/24/2017   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   Hypersomnia 02/28/2016   Abnormal PFT 02/24/2016   Dyspnea 01/28/2016   Diabetic polyneuropathy associated with type 2 diabetes mellitus (HCC) 12/18/2015    Lumbar back pain 12/18/2015   Type 2 diabetes mellitus with hyperlipidemia (HCC) 09/17/2015   Left median nerve neuropathy 04/16/2015   Lesion of right median nerve at forearm 04/16/2015   ICD (implantable cardioverter-defibrillator) in place 03/28/2014   Chronic systolic congestive heart failure (HCC) 03/28/2014   Nonischemic cardiomyopathy (HCC) 11/09/2013   Syncope 10/20/2013   Chest pain 07/28/2013   RENAL CALCULUS 05/13/2009   Obesity 05/08/2009   Former heavy cigarette smoker (20-39 per day) 05/08/2009   HOARSENESS 05/08/2009   GAD (generalized anxiety disorder) 02/28/2008   ASTHMATIC BRONCHITIS, ACUTE 02/28/2008   GERD 02/28/2008   IRRITABLE BOWEL SYNDROME 02/28/2008     Current Outpatient Medications on File Prior to Visit  Medication Sig Dispense Refill   acetaminophen (TYLENOL) 500 MG tablet Take 1,000 mg by mouth every 8 (eight) hours as needed.     albuterol (VENTOLIN HFA) 108 (90 Base) MCG/ACT inhaler Inhale 2 puffs into the lungs every 6 (  six) hours as needed for wheezing or shortness of breath. 18 g 2   amiodarone (PACERONE) 200 MG tablet Take 1 tablet (200 mg total) by mouth daily. 90 tablet 3   apixaban (ELIQUIS) 5 MG TABS tablet Take 1 tablet (5 mg total) by mouth 2 (two) times daily. 60 tablet 3   atorvastatin (LIPITOR) 10 MG tablet Take 1 tablet (10 mg total) by mouth daily. 90 tablet 1   cetirizine (EQ ALLERGY RELIEF, CETIRIZINE,) 10 MG tablet Take 1 tablet (10 mg total) by mouth daily. (Patient taking differently: Take 10 mg by mouth in the morning.) 90 tablet 1   cholecalciferol (VITAMIN D3) 25 MCG (1000 UNIT) tablet Take 1,000 Units by mouth in the morning.     clonazePAM (KLONOPIN) 0.5 MG tablet Take 1 tablet (0.5 mg total) by mouth 2 (two) times daily as needed for anxiety. 60 tablet 1   Clotrimazole 1 % OINT Apply to the affected area twice a day 56.7 g 0   Continuous Glucose Receiver (FREESTYLE LIBRE 2 READER) DEVI USE AS DIRECTED 1 each 0   Continuous  Glucose Sensor (FREESTYLE LIBRE 2 SENSOR) MISC Use to check blood sugar TID. Change sensor once every 14 days. E11.65 2 each 11   EPINEPHrine 0.3 mg/0.3 mL IJ SOAJ injection Inject 0.3 mg into the muscle as needed. 1 each 1   famotidine (PEPCID) 20 MG tablet TAKE 1 TABLET BY MOUTH 1 TO 2 TIMES DAILY AS NEEDED (Patient taking differently: Take 20 mg by mouth See admin instructions. TAKE 1 TABLET BY MOUTH 1 TO 2 TIMES DAILY AS NEEDED) 180 tablet 0   fluticasone (FLONASE) 50 MCG/ACT nasal spray Use 1-2 sprays in each nostril once a day as needed for nasal congestion. 16 g 5   Fluticasone-Umeclidin-Vilant (TRELEGY ELLIPTA) 100-62.5-25 MCG/ACT AEPB Inhale 1 puff into the lungs daily. 1 each 11   Fluticasone-Umeclidin-Vilant (TRELEGY ELLIPTA) 100-62.5-25 MCG/ACT AEPB Inhale 1 puff into the lungs daily.     gabapentin (NEURONTIN) 300 MG capsule Take 1 capsule (300 mg total) by mouth at bedtime. 90 capsule 1   glucose blood (ACCU-CHEK GUIDE) test strip Use as instructed 100 each 12   glucose blood (FREESTYLE LITE) test strip Use as instructed.  Please make sure this is compatible with [facility] 2 reader. 100 each 12   insulin aspart (NOVOLOG) 100 UNIT/ML injection Inject 24 Units into the skin 3 (three) times daily with meals.     insulin glargine (LANTUS SOLOSTAR) 100 UNIT/ML Solostar Pen Inject 42 Units into the skin daily. (Patient taking differently: Inject 36 Units into the skin daily.)     Insulin Pen Needle (B-D ULTRAFINE III SHORT PEN) 31G X 8 MM MISC USE AS DIRECTED 100 each 6   Lancet Devices (ACCU-CHEK SOFTCLIX) lancets 1 each by Other route 3 (three) times daily. 1 each 0   methocarbamol (ROBAXIN) 500 MG tablet Take 1 tablet (500 mg total) by mouth daily. (Patient taking differently: Take 500 mg by mouth at bedtime.) 90 tablet 1   metolazone (ZAROXOLYN) 5 MG tablet Take 1 tablet (5 mg total) by mouth once a week.     metoprolol succinate (TOPROL-XL) 50 MG 24 hr tablet Take 1 tablet (50 mg total) by  mouth daily. (Patient taking differently: Take 50 mg by mouth in the morning.) 90 tablet 3   potassium chloride SA (KLOR-CON M) 20 MEQ tablet Take 2 tablets (40 mEq total) by mouth daily. 60 tablet 3   sacubitril-valsartan (ENTRESTO) 49-51 MG Take  1 tablet by mouth 2 (two) times daily. 60 tablet 3   sertraline (ZOLOFT) 50 MG tablet Take 1.5 tablets (75 mg total) by mouth in the morning. 45 tablet 2   tamsulosin (FLOMAX) 0.4 MG CAPS capsule Take 2 capsules (0.8 mg total) by mouth daily. (Patient taking differently: Take 0.8 mg by mouth in the morning.) 180 capsule 1   torsemide (DEMADEX) 20 MG tablet Take 4 tablets (80 mg total) by mouth 2 (two) times daily. 180 tablet 3   Accu-Chek Softclix Lancets lancets Use as instructed (Patient not taking: Reported on 07/09/2023) 100 each 12   Blood Glucose Monitoring Suppl (ACCU-CHEK GUIDE) w/Device KIT UAD (Patient not taking: Reported on 07/09/2023) 1 kit 0   Blood Glucose Monitoring Suppl DEVI 1 each by Does not apply route in the morning, at noon, and at bedtime. May substitute to any manufacturer covered by patient's insurance. (Patient not taking: Reported on 07/09/2023) 1 each 0   Blood Pressure Monitoring DEVI Dispense 1 bp monitor, check daily. (Patient not taking: Reported on 07/09/2023) 1 each 0   No current facility-administered medications on file prior to visit.    Allergies  Allergen Reactions   Bee Venom Anaphylaxis   Trulicity [Dulaglutide] Anaphylaxis and Diarrhea    Extra dose of Trulicity caused cardiac arrest. Patient experience onset of diarrhea with using this medication - cleared after med stopped.    Social History   Socioeconomic History   Marital status: Single    Spouse name: Not on file   Number of children: Not on file   Years of education: Not on file   Highest education level: Not on file  Occupational History   Occupation: Disabled  Tobacco Use   Smoking status: Former    Current packs/day: 0.00    Average  packs/day: 1 pack/day for 28.0 years (28.0 ttl pk-yrs)    Types: Cigarettes    Start date: 06/03/1986    Quit date: 06/03/2014    Years since quitting: 9.1   Smokeless tobacco: Never  Vaping Use   Vaping status: Never Used  Substance and Sexual Activity   Alcohol use: Yes   Drug use: Yes    Types: Marijuana    Comment: occasionally   Sexual activity: Yes  Other Topics Concern   Not on file  Social History Narrative   Pt lives with his brother    Social Drivers of Corporate investment banker Strain: Low Risk  (05/25/2023)   Overall Financial Resource Strain (CARDIA)    Difficulty of Paying Living Expenses: Not hard at all  Recent Concern: Financial Resource Strain - High Risk (03/24/2023)   Overall Financial Resource Strain (CARDIA)    Difficulty of Paying Living Expenses: Hard  Food Insecurity: No Food Insecurity (05/25/2023)   Hunger Vital Sign    Worried About Running Out of Food in the Last Year: Never true    Ran Out of Food in the Last Year: Never true  Transportation Needs: No Transportation Needs (05/25/2023)   PRAPARE - Administrator, Civil Service (Medical): No    Lack of Transportation (Non-Medical): No  Physical Activity: Inactive (05/25/2023)   Exercise Vital Sign    Days of Exercise per Week: 0 days    Minutes of Exercise per Session: 0 min  Stress: Stress Concern Present (05/25/2023)   [employer] of Occupational Health - Occupational Stress Questionnaire    Feeling of Stress : Rather much  Social Connections: Moderately Integrated (05/25/2023)  Social Connection and Isolation Panel [NHANES]    Frequency of Communication with Friends and Family: More than three times a week    Frequency of Social Gatherings with Friends and Family: More than three times a week    Attends Religious Services: More than 4 times per year    Active Member of Clubs or Organizations: Yes    Attends Banker Meetings: 1 to 4 times per year     Marital Status: Never married  Intimate Partner Violence: Not At Risk (05/25/2023)   Humiliation, Afraid, Rape, and Kick questionnaire    Fear of Current or Ex-Partner: No    Emotionally Abused: No    Physically Abused: No    Sexually Abused: No    Family History  Problem Relation Age of Onset   Diabetes Father    Arthritis Father    Hypertension Father    Diabetes Mother    Arthritis Mother    Hypertension Mother    Hypertension Brother     Past Surgical History:  Procedure Laterality Date   CARDIAC CATHETERIZATION  08/10/13   CHOLECYSTECTOMY  ~ 2010   IMPLANTABLE CARDIOVERTER DEFIBRILLATOR IMPLANT  12/23/2013   STJ Fortify ICD implanted by Dr Johney Frame for cardiomyopathy and syncope   IMPLANTABLE CARDIOVERTER DEFIBRILLATOR IMPLANT N/A 12/23/2013   Procedure: IMPLANTABLE CARDIOVERTER DEFIBRILLATOR IMPLANT;  Surgeon: Gardiner Rhyme, MD;  Location: [facility] CATH LAB;  Service: Cardiovascular;  Laterality: N/A;   INGUINAL HERNIA REPAIR Left 03/01/2018   Procedure: OPEN REPAIR OF LEFT INGUINAL HERNIA WITH MESH;  Surgeon: Gaynelle Adu, MD;  Location: [facility] ORS;  Service: General;  Laterality: Left;   LEAD REVISION/REPAIR N/A 05/15/2022   Procedure: LEAD REVISION/REPAIR;  Surgeon: Lanier Prude, MD;  Location: [facility] INVASIVE CV LAB;  Service: Cardiovascular;  Laterality: N/A;   LEFT HEART CATHETERIZATION WITH CORONARY ANGIOGRAM N/A 08/10/2013   Procedure: LEFT HEART CATHETERIZATION WITH CORONARY ANGIOGRAM;  Surgeon: Corky Crafts, MD;  Location: [facility] CATH LAB;  Service: Cardiovascular;  Laterality: N/A;   ORIF HUMERUS FRACTURE Left 08/12/2013   Procedure: OPEN REDUCTION INTERNAL FIXATION (ORIF) HUMERAL SHAFT FRACTURE;  Surgeon: Nadara Mustard, MD;  Location: [facility] OR;  Service: Orthopedics;  Laterality: Left;  Open Reduction Internal Fixation Left Humerus   RIGHT/LEFT HEART CATH AND CORONARY ANGIOGRAPHY N/A 05/12/2022   Procedure: RIGHT/LEFT HEART CATH AND CORONARY ANGIOGRAPHY;  Surgeon: Dorthula Nettles, DO;  Location: [facility] INVASIVE CV LAB;  Service: Cardiovascular;  Laterality: N/A;   URETEROSCOPY     "laser for kidney stones"    ROS: Review of Systems Negative except as stated above  PHYSICAL EXAM: BP 98/64 (BP Location: Left Arm, Patient Position: Sitting, Cuff Size: Large)   Pulse 72   Resp (!) 21   Ht 6' (1.829 m)   Wt 299 lb 3.2 oz (135.7 kg)   SpO2 91%   BMI 40.58 kg/m   Wt Readings from Last 3 Encounters:  07/09/23 299 lb 3.2 oz (135.7 kg)  06/30/23 (!) 301 lb 6.4 oz (136.7 kg)  06/29/23 (!) 300 lb 12.8 oz (136.4 kg)    Physical Exam General appearance - alert, well appearing, and in no distress Mental status - normal mood, behavior, speech, dress, motor activity, and thought processes Mouth - mucous membranes moist, pharynx normal without lesions Neck - supple, no significant adenopathy Chest -breath sounds decreased bilaterally.  No crackles, wheezes or rhonchi heard Heart -regular rate rhythm GU Male -patient declined exam today of the genital area Musculoskeletal -left arm  still swollen but has decreased in size compared to previous Left knee: No edema noted.  Good range of motion. Extremities -no lower extremity edema.     Latest Ref Rng & Units 05/21/2023   12:05 PM 05/13/2023    3:26 PM 04/26/2023    5:54 AM  CMP  Glucose 70 - 99 mg/dL 956  213  086   BUN 6 - 20 mg/dL 27  23  29    Creatinine 0.61 - 1.24 mg/dL 5.78  4.69  6.29   Sodium 135 - 145 mmol/L 138  138  140   Potassium 3.5 - 5.1 mmol/L 4.1  3.9  3.6   Chloride 98 - 111 mmol/L 101  102  104   CO2 22 - 32 mmol/L 23  24  26    Calcium 8.9 - 10.3 mg/dL 9.4  9.7  9.1   Total Protein 6.5 - 8.1 g/dL  7.2  6.6   Total Bilirubin <1.2 mg/dL  0.6  0.7   Alkaline Phos 38 - 126 U/L  48  51   AST 15 - 41 U/L  16  13   ALT 0 - 44 U/L  18  21    Lipid Panel     Component Value Date/Time   CHOL 166 02/13/2023 1658   TRIG 283 (H) 02/13/2023 1658   HDL 44 02/13/2023 1658   CHOLHDL 3.8 02/13/2023  1658   CHOLHDL 4 05/08/2009 1040   VLDL 27.0 05/08/2009 1040   LDLCALC 76 02/13/2023 1658    CBC    Component Value Date/Time   WBC 5.9 05/13/2023 1526   RBC 4.46 05/13/2023 1526   HGB 13.4 05/13/2023 1526   HGB 14.0 08/26/2021 1608   HCT 41.5 05/13/2023 1526   HCT 40.9 08/26/2021 1608   PLT 290 05/13/2023 1526   PLT 286 08/26/2021 1608   MCV 93.0 05/13/2023 1526   MCV 88 08/26/2021 1608   MCH 30.0 05/13/2023 1526   MCHC 32.3 05/13/2023 1526   RDW 14.4 05/13/2023 1526   RDW 13.0 08/26/2021 1608   LYMPHSABS 2.5 04/05/2023 0315   LYMPHSABS 1.7 06/28/2018 1440   MONOABS 0.7 04/05/2023 0315   EOSABS 0.2 04/05/2023 0315   EOSABS 0.3 06/28/2018 1440   BASOSABS 0.1 04/05/2023 0315   BASOSABS 0.0 06/28/2018 1440    ASSESSMENT AND PLAN: 1. Type 2 diabetes mellitus with morbid obesity (HCC) (Primary) Not at goal.  Dietary indiscretions contributing quite a bit.  Discussed the importance of improving his eating habits and more.  He declines referral to nutritionist stating that his anxiety plays a major role and he will continue to work on this with his psychiatrist. -Recommend increasing Lantus insulin from 34 units daily to 38 units daily.  Continue NovoLog 28 units with meals. - POCT glycosylated hemoglobin (Hb A1C) - POCT glucose (manual entry)  2. Chronic systolic congestive heart failure (HCC) 3. NICM (nonischemic cardiomyopathy) (HCC) Stable on current medications listed above.  Keep upcoming appointment with cardiology.  4. Bronchitis -- doxycycline (VIBRA-TABS) 100 MG tablet; Take 1 tablet (100 mg total) by mouth 2 (two) times daily.  Dispense: 14 tablet; Refill: 0  5. Swelling of joint, knee, left I have reordered the x-ray of his left knee - DG Knee Complete 4 Views Left; Future  6. 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  Continue Eliquis.  Recent CBC done  in December showed normal hemoglobin and platelet count  7. GAD (generalized anxiety disorder) Plugged in with behavioral health  8. Jock itch Patient will continue the multi guard powder that contains 2% miconazole.  Advised to stop the Vaseline  9. Pneumococcal vaccination declined  Patient was given the opportunity to ask questions.  Patient verbalized understanding of the plan and was able to repeat key elements of the plan.   This documentation was completed using Paediatric nurse.  Any transcriptional errors are unintentional.  Orders Placed This Encounter  Procedures   DG Knee Complete 4 Views Left   POCT glycosylated hemoglobin (Hb A1C)   POCT glucose (manual entry)     Requested Prescriptions   Signed Prescriptions Disp Refills   doxycycline (VIBRA-TABS) 100 MG tablet 14 tablet 0    Sig: Take 1 tablet (100 mg total) by mouth 2 (two) times daily.    Return in about 4 months (around 11/06/2023).  Jonah Blue, MD, FACP

## 2023-07-13 ENCOUNTER — Encounter: Payer: Medicaid Other | Attending: Cardiology

## 2023-07-13 DIAGNOSIS — I5022 Chronic systolic (congestive) heart failure: Secondary | ICD-10-CM | POA: Diagnosis not present

## 2023-07-13 DIAGNOSIS — Z95810 Presence of automatic (implantable) cardiac defibrillator: Secondary | ICD-10-CM | POA: Diagnosis not present

## 2023-07-14 ENCOUNTER — Other Ambulatory Visit: Payer: Self-pay | Admitting: Family Medicine

## 2023-07-16 ENCOUNTER — Telehealth: Payer: Self-pay

## 2023-07-16 NOTE — Telephone Encounter (Signed)
 Remote ICM transmission received.  Attempted call to patient regarding ICM remote transmission and no answer.

## 2023-07-19 ENCOUNTER — Encounter: Payer: Self-pay | Admitting: Physical Medicine and Rehabilitation

## 2023-07-20 ENCOUNTER — Encounter (INDEPENDENT_AMBULATORY_CARE_PROVIDER_SITE_OTHER): Payer: Medicaid Other

## 2023-07-20 DIAGNOSIS — I5022 Chronic systolic (congestive) heart failure: Secondary | ICD-10-CM

## 2023-07-20 DIAGNOSIS — Z95810 Presence of automatic (implantable) cardiac defibrillator: Secondary | ICD-10-CM

## 2023-07-21 ENCOUNTER — Telehealth (HOSPITAL_COMMUNITY): Payer: Self-pay

## 2023-07-21 ENCOUNTER — Other Ambulatory Visit: Payer: Self-pay | Admitting: Physical Medicine and Rehabilitation

## 2023-07-21 ENCOUNTER — Telehealth: Payer: Self-pay | Admitting: Physical Medicine and Rehabilitation

## 2023-07-21 DIAGNOSIS — M5416 Radiculopathy, lumbar region: Secondary | ICD-10-CM

## 2023-07-21 MED ORDER — ACETAMINOPHEN-CODEINE 300-30 MG PO TABS
1.0000 | ORAL_TABLET | Freq: Three times a day (TID) | ORAL | 0 refills | Status: DC | PRN
Start: 2023-07-21 — End: 2023-08-24

## 2023-07-21 NOTE — Telephone Encounter (Signed)
Pt called requesting an immediate call back. Pt states he had an injection 07/06/22 and condition is getting worse since injection and he is barely able to walk and he wants to know what is going on. Pt is asking for a call back as soon as possible. Pt phone number is [phone].

## 2023-07-21 NOTE — Telephone Encounter (Signed)
Called patient to confirm appointment scheduled tomorrow here in the [facility] clinic. Patient aware of appointment time, date, and location. Patient verified they will be at appointment.

## 2023-07-22 ENCOUNTER — Inpatient Hospital Stay (HOSPITAL_COMMUNITY): Admission: RE | Admit: 2023-07-22 | Payer: Medicaid Other | Source: Ambulatory Visit

## 2023-07-22 NOTE — Progress Notes (Signed)
EPIC Encounter for ICM Monitoring  Patient Name: [name] is a 58 y.o. male Date: 07/22/2023 Primary Care Physican: Marcine Matar, MD Primary Cardiologist: Crenshaw/[name] Electrophysiologist: Lalla Brothers 01/07/2023 Weight: 280 lbs 02/13/2023 Weight: 297 lbs 03/25/2023 Weight: 297 lbs    06/12/2023 Weight: 289 lbs   07/16/2023 Weight: Not weighing                      Transmission results reviewed.    Corvue thoracic impedance remains unstable but has returned to normal on 2/10.   Prescribed dosage:  Torsemide 20 mg take 4 tablet(s) (80 mg total) by mouth twice a day Potassium 20 mEq take 2 tablets (40 mEq total) by mouth every day.   2/6 Pt reports he was not aware he should be taking Potassium until today, 2/6. Metolazone 5 mg take 1 tablet by mouth once a week (Nephrologist said he could take it up to twice a week).  Last dose was 2/3   Labs: 05/21/2023 Creatinine 2.56, BUN 27, Potassium 4.1, Sodium 138, GFR 28  05/13/2023 Creatinine 2.75, BUN 23, Potassium 3.9, Sodium 138, GFR 26  04/26/2023 Creatinine 2.45, BUN 29, Potassium 3.6, Sodium 140, GFR 30  04/25/2023 Creatinine 2.50, BUN 28, Potassium 3.7, Sodium 139, GFR 29  04/24/2023 Creatinine 2.58, BUN 27, Potassium 4.0, Sodium 139, GFR 28  04/05/2023 Creatinine 2.52, BUN 43, Potassium 4.3, Sodium 140, GFR 29 04/04/2023 Creatinine 2.83, BUN 42, Potassium 4.5, Sodium 139, GFR 25  04/03/2023 Creatinine 2.65, BUN 41, Potassium 4.0, Sodium 141, GFR 27  04/02/2023 Creatinine 3.14, BUN 44, Potassium 4.0, Sodium 134, GFR 22  A complete set of results can be found in Results Review.   Recommendations:  Recommendations will be given at 2/12 HF clinic OV   Follow-up plan: ICM clinic phone appointment on 07/28/2023 to recheck fluid levels.   91 day device clinic remote transmission 08/19/2023.     EP/Cardiology Office Visits:  Recall 11/02/2023 with Dr Lalla Brothers or Yevette Edwards, PA.  07/22/2023 with HF Clinic.  Recall 09/18/2023 with  [name].   Copy of ICM check sent to Dr Lalla Brothers.  3 month ICM trend: 07/20/2023.    12-14 Month ICM trend:     Karie Soda, RN 07/22/2023 2:36 PM

## 2023-07-27 ENCOUNTER — Other Ambulatory Visit (HOSPITAL_COMMUNITY): Payer: Self-pay | Admitting: Psychiatry

## 2023-07-27 DIAGNOSIS — F410 Panic disorder [episodic paroxysmal anxiety] without agoraphobia: Secondary | ICD-10-CM

## 2023-07-27 DIAGNOSIS — F4310 Post-traumatic stress disorder, unspecified: Secondary | ICD-10-CM

## 2023-07-27 DIAGNOSIS — F411 Generalized anxiety disorder: Secondary | ICD-10-CM

## 2023-07-27 NOTE — Progress Notes (Incomplete)
PCP: Marcine Matar, MD  HF Cardiologist: Dr. Gasper Lloyd Cardiologist: Dr. Jens Som EP: Dr. Lalla Brothers  Chief Complaint: HFrEF  HPI: [name] is a 58 y.o. male with history of HFrEF (dating back to 2015)/NICM s/p ICDK, CKD IIIb, DM II, severe OSA, COPD, prior hx of tobacco use.   In November 2023 he was admitted with VT and had recurrent device shocks. VT thought to be d/t electrolyte derrangements in setting of right gluteal abscess and brady/pacing mediated. R/LHC with no significant CAD, normal cardiac output and low filling pressures. Underwent placement of atrial lead during the admission to help prevent V pacing and allow more room for GDMT  Echo 03/24: EF 20-25%, moderate LVH, RV okay  Admitted 10/24 with groin cellulitis. Treated with IV antibiotics. He is now off [place].  Readmitted 11/24 with extensive left upper extremity DVT. He was started on Eliquis. Echo 11/24: EF 25-30%.  Last seen for follow-up in 12/24. Volume was okay. Entresto increased d/t hypertension.  He is here today for CHF follow-up. After last visit, his spiro was stopped by Nephrology and Torsemide increased to 80 mg BID then reduced back to 80/40 mg daily. Takes metolazone up to twice a week. He has chronic shortness of breath with exertion which has been stable. Reports he has been recovering from a viral illness over the last week and has been drinking more fluids. No orthopnea, PND or lower extremity edema.   Has been dealing with panic attacks and PTSD following ICD shocks in 2023. Following with Psychiatry.   ROS: All systems negative except as listed in HPI, PMH and Problem List.  SH:  Social History   Socioeconomic History   Marital status: Single    Spouse name: Not on file   Number of children: Not on file   Years of education: Not on file   Highest education level: Not on file  Occupational History   Occupation: Disabled  Tobacco Use   Smoking status: Former    Current packs/day:  0.00    Average packs/day: 1 pack/day for 28.0 years (28.0 ttl pk-yrs)    Types: Cigarettes    Start date: 06/03/1986    Quit date: 06/03/2014    Years since quitting: 9.1   Smokeless tobacco: Never  Vaping Use   Vaping status: Never Used  Substance and Sexual Activity   Alcohol use: Yes   Drug use: Yes    Types: Marijuana    Comment: occasionally   Sexual activity: Yes  Other Topics Concern   Not on file  Social History Narrative   Pt lives with his brother    Social Drivers of Corporate investment banker Strain: Low Risk  (05/25/2023)   Overall Financial Resource Strain (CARDIA)    Difficulty of Paying Living Expenses: Not hard at all  Recent Concern: Financial Resource Strain - High Risk (03/24/2023)   Overall Financial Resource Strain (CARDIA)    Difficulty of Paying Living Expenses: Hard  Food Insecurity: No Food Insecurity (05/25/2023)   Hunger Vital Sign    Worried About Running Out of Food in the Last Year: Never true    Ran Out of Food in the Last Year: Never true  Transportation Needs: No Transportation Needs (05/25/2023)   PRAPARE - Administrator, Civil Service (Medical): No    Lack of Transportation (Non-Medical): No  Physical Activity: Inactive (05/25/2023)   Exercise Vital Sign    Days of Exercise per Week: 0 days  Minutes of Exercise per Session: 0 min  Stress: Stress Concern Present (05/25/2023)   [employer] of Occupational Health - Occupational Stress Questionnaire    Feeling of Stress : Rather much  Social Connections: Moderately Integrated (05/25/2023)   Social Connection and Isolation Panel [NHANES]    Frequency of Communication with Friends and Family: More than three times a week    Frequency of Social Gatherings with Friends and Family: More than three times a week    Attends Religious Services: More than 4 times per year    Active Member of [employer] or Organizations: Yes    Attends Banker Meetings: 1 to 4 times  per year    Marital Status: Never married  Intimate Partner Violence: Not At Risk (05/25/2023)   Humiliation, Afraid, Rape, and Kick questionnaire    Fear of Current or Ex-Partner: No    Emotionally Abused: No    Physically Abused: No    Sexually Abused: No    FH:  Family History  Problem Relation Age of Onset   Diabetes Father    Arthritis Father    Hypertension Father    Diabetes Mother    Arthritis Mother    Hypertension Mother    Hypertension Brother     Past Medical History:  Diagnosis Date   AICD (automatic cardioverter/defibrillator) present    Chronic bronchitis (HCC)    "get it most q yr" (12/23/2013)   Chronic systolic (congestive) heart failure (HCC)    Fracture of left humerus    a. 07/2013.   GERD (gastroesophageal reflux disease)    not at present time   Heart murmur    "born w/it"    History of renal calculi    Hypertension    Kidney stones    NICM (nonischemic cardiomyopathy) (HCC)    a. 07/2013 Echo: EF 20-25%, diff HK, Gr2 DD, mild MR, mod dil LA/RA. 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     acetaminophen-codeine (TYLENOL #3) 300-30 MG tablet Take 1 tablet by mouth every 8 (eight) hours as needed for moderate pain (pain score 4-6) or severe pain (pain score 7-10). 20 tablet 0   albuterol (VENTOLIN HFA) 108 (90 Base) MCG/ACT inhaler Inhale 2 puffs into the lungs every 6 (six) hours as needed for wheezing or shortness of breath. 18 g 2   amiodarone (PACERONE) 200 MG tablet Take 1 tablet (200 mg total) by mouth daily. 90 tablet 3    apixaban (ELIQUIS) 5 MG TABS tablet Take 1 tablet (5 mg total) by mouth 2 (two) times daily. 60 tablet 3   atorvastatin (LIPITOR) 10 MG tablet Take 1 tablet (10 mg total) by mouth daily. 90 tablet 1   Blood Glucose Monitoring Suppl (ACCU-CHEK GUIDE) w/Device KIT UAD 1 kit 0   Blood Glucose Monitoring Suppl DEVI 1 each by Does not apply route in the morning, at noon, and at bedtime. May substitute to any manufacturer covered by patient's insurance. 1 each 0   Blood Pressure Monitoring DEVI Dispense 1 bp monitor,  check daily. 1 each 0   cetirizine (EQ ALLERGY RELIEF, CETIRIZINE,) 10 MG tablet Take 1 tablet (10 mg total) by mouth daily. (Patient taking differently: Take 10 mg by mouth in the morning.) 90 tablet 1   cholecalciferol (VITAMIN D3) 25 MCG (1000 UNIT) tablet Take 1,000 Units by mouth in the morning.     clonazePAM (KLONOPIN) 0.5 MG tablet Take 1 tablet (0.5 mg total) by mouth 2 (two) times daily as needed for anxiety. 60 tablet 1   Clotrimazole 1 % OINT Apply to the affected area twice a day 56.7 g 0   Continuous Glucose Receiver (FREESTYLE LIBRE 2 READER) DEVI USE AS DIRECTED 1 each 0   Continuous Glucose Sensor (FREESTYLE LIBRE 2 SENSOR) MISC Use to check blood sugar TID. Change sensor once every 14 days. E11.65 2 each 11   EPINEPHrine 0.3 mg/0.3 mL IJ SOAJ injection Inject 0.3 mg into the muscle as needed. 1 each 1   famotidine (PEPCID) 20 MG tablet TAKE 1 TABLET BY MOUTH 1 TO 2 TIMES DAILY AS NEEDED 180 tablet 0   fluticasone (FLONASE) 50 MCG/ACT nasal spray Use 1-2 sprays in each nostril once a day as needed for nasal congestion. 16 g 5   Fluticasone-Umeclidin-Vilant (TRELEGY ELLIPTA) 100-62.5-25 MCG/ACT AEPB Inhale 1 puff into the lungs daily. 1 each 11   gabapentin (NEURONTIN) 300 MG capsule Take 1 capsule (300 mg total) by mouth at bedtime. 90 capsule 1   glucose blood (ACCU-CHEK GUIDE) test strip Use as instructed 100 each 12   glucose blood (FREESTYLE LITE) test strip Use as  instructed.  Please make sure this is compatible with [facility] 2 reader. 100 each 12   insulin aspart (NOVOLOG) 100 UNIT/ML injection Inject 24 Units into the skin 3 (three) times daily with meals.     insulin glargine (LANTUS SOLOSTAR) 100 UNIT/ML Solostar Pen Inject 42 Units into the skin daily. (Patient taking differently: Inject 36 Units into the skin daily.)     Insulin Pen Needle (B-D ULTRAFINE III SHORT PEN) 31G X 8 MM MISC USE AS DIRECTED 100 each 6   Lancet Devices (ACCU-CHEK SOFTCLIX) lancets 1 each by Other route 3 (three) times daily. 1 each 0   methocarbamol (ROBAXIN) 500 MG tablet Take 1 tablet (500 mg total) by mouth daily. (Patient taking differently: Take 500 mg by mouth at bedtime.) 90 tablet 1   metolazone (ZAROXOLYN) 5 MG tablet Take 1 tablet (5 mg total) by mouth once a week.     metoprolol succinate (TOPROL-XL) 50 MG 24 hr tablet Take 1 tablet (50 mg total) by mouth daily. 90 tablet 3   potassium chloride SA (KLOR-CON M) 20 MEQ tablet Take 2 tablets (40 mEq total) by mouth daily. 60 tablet 3   sacubitril-valsartan (ENTRESTO) 49-51 MG Take 1 tablet by mouth 2 (two) times daily. 60 tablet 3   sertraline (ZOLOFT) 50 MG tablet Take 1.5 tablets (75 mg total) by mouth in the morning. 45 tablet 2   tamsulosin (FLOMAX) 0.4 MG CAPS capsule Take 2 capsules (0.8 mg total) by mouth daily. 180 capsule 1   torsemide (DEMADEX) 20 MG tablet Take 4 tabs (80 mg) in AM and 2 tabs (40 mg) in PM     traZODone (DESYREL) 50 MG tablet Take 1 tablet (50 mg total) by mouth at bedtime. 30 tablet 2   No current facility-administered medications for this encounter.    Vitals:   07/30/23 1551  BP: 126/80  Pulse: 73  SpO2: 94%  Weight: 134.3 kg (296 lb 2 oz)    PHYSICAL EXAM: General:  Well appearing. Ambulated into clinic. Neck: no JVD.  Cor: Regular rate & rhythm. No rubs, gallops or murmurs. Lungs: clear Abdomen: soft, nontender, nondistended.  Extremities: no edema Neuro: alert & orientedx3.  Depressed affect.  ICD interrogation today (interpreted personally): Thoracic impedance trending below threshold over the past week, no VT/VF or Afib  ASSESSMENT & PLAN:  Chronic systolic CHF Stage C Etiology of HF: Nonischemic as demonstrated by coronary angiography; no family history. If he continues to have recurrent VT will consider evaluation for sarcoidosis. Device interrogation today with no recurrent arrhythmias. NYHA class / AHA Stage: IIIb; Functional class confounded by pulmonary disease and body habitus Volume status & Diuretics: Does not appear significantly overloaded by exam. However, thoracic impedance trending below threshold on device. Taking Torsemide 80 mg q am and 40 mg q pm. Increase Torsemide to 80 BID X 3 days then reduce back to 80/40 mg daily. Continue metolazone 5 mg up to twice a week. He is followed in remote monitoring clinic by Randon Goldsmith, RN, will see if she can follow-up on his volume status next week. Vasodilators: Continue Entresto 24/26 mg bid Beta-Blocker: Continue Toprol XL 50 mg daily. MRA: Spironolactone recently stopped by Nephrology d/t dizziness, consider retrial at some point Cardiometabolic:  Off Farxiga given history of groin cellulitis and issues with groin fungal infections Devices therapies & Valvulopathies: ICD Advanced therapies: CPX in 2017 peak VO2 of 15 AND BJ/YN82 of 35 but with submaximal effort; consistent with moderate HF limitations. - Advanced therapies at this time limited by CKD and social barriers.   2. VT -Continue amiodarone.  -No recurrent arrhythmias on device interrogation today -Follows with EP  3. DVT -Diagnosed 11/24 -Continue Eliquis 5 mg BID -Following with hematology, planning repeat US in in April  4. CKD IIIb -Baseline Scr 2.5 -Check labs today.  -Continue to follow with [facility]  5. DM II -Not controlled. Last A1c 8.9%. -No SGLT2i with history of groin infections -Continue to follow with Dr.  Elvera Lennox  6. OSA - Home sleep study in 05/24 showed severe OSA - Has not completed CPAP titration study, he reports it is scheduled for next month - Discussed the importance of treating his sleep apnea

## 2023-07-28 ENCOUNTER — Encounter (INDEPENDENT_AMBULATORY_CARE_PROVIDER_SITE_OTHER): Payer: Medicaid Other

## 2023-07-28 ENCOUNTER — Encounter (HOSPITAL_COMMUNITY): Payer: Self-pay

## 2023-07-28 DIAGNOSIS — I5022 Chronic systolic (congestive) heart failure: Secondary | ICD-10-CM

## 2023-07-28 DIAGNOSIS — Z95810 Presence of automatic (implantable) cardiac defibrillator: Secondary | ICD-10-CM

## 2023-07-28 NOTE — Progress Notes (Signed)
EPIC Encounter for ICM Monitoring  Patient Name: [name] is a 58 y.o. male Date: 07/28/2023 Primary Care Physican: Marcine Matar, MD Primary Cardiologist: Crenshaw/[name] Electrophysiologist: Lalla Brothers 01/07/2023 Weight: 280 lbs 02/13/2023 Weight: 297 lbs 03/25/2023 Weight: 297 lbs    06/12/2023 Weight: 289 lbs   07/16/2023 Weight: Not weighing   07/28/2023 Weight: 284 lbs                    Spoke with patient and heart failure questions reviewed.  Transmission results reviewed.  Pt reports he is getting over the flu.     [place] thoracic impedance remains unstable.  Since 2/10, possible fluid accumulation from 2/11-2/13.   Prescribed dosage:  Torsemide 20 mg take 4 tablet(s) (80 mg total) by mouth twice a day Potassium 20 mEq take 2 tablets (40 mEq total) by mouth every day.    Metolazone 5 mg take 1 tablet by mouth once a week (Nephrologist said he could take it up to twice a week).     Labs: 05/21/2023 Creatinine 2.56, BUN 27, Potassium 4.1, Sodium 138, GFR 28  05/13/2023 Creatinine 2.75, BUN 23, Potassium 3.9, Sodium 138, GFR 26  04/26/2023 Creatinine 2.45, BUN 29, Potassium 3.6, Sodium 140, GFR 30  04/25/2023 Creatinine 2.50, BUN 28, Potassium 3.7, Sodium 139, GFR 29  04/24/2023 Creatinine 2.58, BUN 27, Potassium 4.0, Sodium 139, GFR 28  04/05/2023 Creatinine 2.52, BUN 43, Potassium 4.3, Sodium 140, GFR 29 04/04/2023 Creatinine 2.83, BUN 42, Potassium 4.5, Sodium 139, GFR 25  04/03/2023 Creatinine 2.65, BUN 41, Potassium 4.0, Sodium 141, GFR 27  04/02/2023 Creatinine 3.14, BUN 44, Potassium 4.0, Sodium 134, GFR 22  A complete set of results can be found in Results Review.   Recommendations:  No changes and encouraged to call if experiencing any fluid symptoms.   Follow-up plan: ICM clinic phone appointment on 08/24/2023.   91 day device clinic remote transmission 08/19/2023.     EP/Cardiology Office Visits:  Recall 11/02/2023 with Dr Lalla Brothers or Yevette Edwards, PA.  Pt  canceled 07/22/2023 with HF Clinic and rescheduled 2/20.  Recall 09/18/2023 with [name].   Copy of ICM check sent to Dr Lalla Brothers.  3 month ICM trend: 07/28/2023.    12-14 Month ICM trend:     Karie Soda, RN 07/28/2023 10:16 AM
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
## 2023-07-30 ENCOUNTER — Telehealth (HOSPITAL_COMMUNITY): Payer: Medicaid Other | Admitting: Psychiatry

## 2023-07-30 ENCOUNTER — Ambulatory Visit (HOSPITAL_COMMUNITY)
Admission: RE | Admit: 2023-07-30 | Discharge: 2023-07-30 | Disposition: A | Discharge status: Home / Self Care | Payer: Medicaid Other | Source: Ambulatory Visit | Attending: Physician Assistant | Admitting: Physician Assistant

## 2023-07-30 ENCOUNTER — Encounter (HOSPITAL_COMMUNITY): Payer: Self-pay | Admitting: Psychiatry

## 2023-07-30 VITALS — BP 126/80 | HR 73 | Wt 296.1 lb

## 2023-07-30 DIAGNOSIS — F411 Generalized anxiety disorder: Secondary | ICD-10-CM

## 2023-07-30 DIAGNOSIS — Z8249 Family history of ischemic heart disease and other diseases of the circulatory system: Secondary | ICD-10-CM | POA: Insufficient documentation

## 2023-07-30 DIAGNOSIS — I130 Hypertensive heart and chronic kidney disease with heart failure and stage 1 through stage 4 chronic kidney disease, or unspecified chronic kidney disease: Secondary | ICD-10-CM | POA: Insufficient documentation

## 2023-07-30 DIAGNOSIS — I472 Ventricular tachycardia, unspecified: Secondary | ICD-10-CM | POA: Diagnosis not present

## 2023-07-30 DIAGNOSIS — Z794 Long term (current) use of insulin: Secondary | ICD-10-CM | POA: Diagnosis not present

## 2023-07-30 DIAGNOSIS — Z5986 Financial insecurity: Secondary | ICD-10-CM | POA: Diagnosis not present

## 2023-07-30 DIAGNOSIS — Z87891 Personal history of nicotine dependence: Secondary | ICD-10-CM | POA: Diagnosis not present

## 2023-07-30 DIAGNOSIS — Z833 Family history of diabetes mellitus: Secondary | ICD-10-CM | POA: Insufficient documentation

## 2023-07-30 DIAGNOSIS — Z7901 Long term (current) use of anticoagulants: Secondary | ICD-10-CM | POA: Diagnosis not present

## 2023-07-30 DIAGNOSIS — Z95810 Presence of automatic (implantable) cardiac defibrillator: Secondary | ICD-10-CM | POA: Insufficient documentation

## 2023-07-30 DIAGNOSIS — E1122 Type 2 diabetes mellitus with diabetic chronic kidney disease: Secondary | ICD-10-CM | POA: Insufficient documentation

## 2023-07-30 DIAGNOSIS — F4310 Post-traumatic stress disorder, unspecified: Secondary | ICD-10-CM | POA: Diagnosis not present

## 2023-07-30 DIAGNOSIS — J449 Chronic obstructive pulmonary disease, unspecified: Secondary | ICD-10-CM | POA: Insufficient documentation

## 2023-07-30 DIAGNOSIS — G4733 Obstructive sleep apnea (adult) (pediatric): Secondary | ICD-10-CM | POA: Diagnosis not present

## 2023-07-30 DIAGNOSIS — Z79899 Other long term (current) drug therapy: Secondary | ICD-10-CM | POA: Insufficient documentation

## 2023-07-30 DIAGNOSIS — I428 Other cardiomyopathies: Secondary | ICD-10-CM | POA: Diagnosis not present

## 2023-07-30 DIAGNOSIS — E1165 Type 2 diabetes mellitus with hyperglycemia: Secondary | ICD-10-CM | POA: Diagnosis not present

## 2023-07-30 DIAGNOSIS — N1832 Chronic kidney disease, stage 3b: Secondary | ICD-10-CM | POA: Insufficient documentation

## 2023-07-30 DIAGNOSIS — I5022 Chronic systolic (congestive) heart failure: Secondary | ICD-10-CM | POA: Insufficient documentation

## 2023-07-30 DIAGNOSIS — Z86718 Personal history of other venous thrombosis and embolism: Secondary | ICD-10-CM | POA: Diagnosis not present

## 2023-07-30 DIAGNOSIS — F410 Panic disorder [episodic paroxysmal anxiety] without agoraphobia: Secondary | ICD-10-CM

## 2023-07-30 LAB — BASIC METABOLIC PANEL
Anion gap: 13 (ref 5–15)
BUN: 36 mg/dL — ABNORMAL HIGH (ref 6–20)
CO2: 20 mmol/L — ABNORMAL LOW (ref 22–32)
Calcium: 9.6 mg/dL (ref 8.9–10.3)
Chloride: 106 mmol/L (ref 98–111)
Creatinine, Ser: 2.74 mg/dL — ABNORMAL HIGH (ref 0.61–1.24)
GFR, Estimated: 26 mL/min — ABNORMAL LOW (ref >60)
Glucose, Bld: 239 mg/dL — ABNORMAL HIGH (ref 70–99)
Potassium: 4.6 mmol/L (ref 3.5–5.1)
Sodium: 139 mmol/L (ref 135–145)

## 2023-07-30 LAB — BRAIN NATRIURETIC PEPTIDE: B Natriuretic Peptide: 27.9 pg/mL (ref 0.0–100.0)

## 2023-07-30 MED ORDER — TRAZODONE HCL 50 MG PO TABS: 50.0000 mg | ORAL_TABLET | Freq: Every day | ORAL | 2 refills | Status: DC

## 2023-07-30 NOTE — Patient Instructions (Signed)
Great to see you today!!!  Medication Changes:  Take Torsemide 80 mg (4 tabs) Twice daily FOR 3 DAY ONLY, then back to 80 mg (4 tabs) in AM and 40 mg (2 tabs) in PM  Lab Work:  Labs done today, your results will be available in MyChart, we will contact you for abnormal readings.  Special Instructions // Education:  Do the following things EVERYDAY: Weigh yourself in the morning before breakfast. Write it down and keep it in a log. Take your medicines as prescribed Eat low salt foods--Limit salt (sodium) to 2000 mg per day.  Stay as active as you can everyday Limit all fluids for the day to less than 2 liters   Follow-Up in: 4 weeks   At the [facility], you and your health needs are our priority. We have a designated team specialized in the treatment of Heart Failure. This Care Team includes your primary Heart Failure Specialized Cardiologist (physician), Advanced Practice Providers (APPs- Physician Assistants and Nurse Practitioners), and Pharmacist who all work together to provide you with the care you need, when you need it.   You may see any of the following providers on your designated Care Team at your next follow up:  Dr. Arvilla Meres Dr. Marca Ancona Dr. Dorthula Nettles Dr. Theresia Bough Tonye Becket, NP Robbie Lis, [place] [facility] [place], [place] Brynda Peon, NP [place] Lee, NP Karle Plumber, PharmD   Please be sure to bring in all your medications bottles to every appointment.   Need to Contact us:  If you have any questions or concerns before your next appointment please send [place] a message through [place] or call our office at [phone].    TO LEAVE A MESSAGE FOR THE NURSE SELECT OPTION 2, PLEASE LEAVE A MESSAGE INCLUDING: YOUR NAME DATE OF BIRTH CALL BACK NUMBER REASON FOR CALL**this is important as we prioritize the call backs  YOU WILL RECEIVE A CALL BACK THE SAME DAY AS LONG AS YOU CALL BEFORE 4:00 PM

## 2023-08-02 ENCOUNTER — Ambulatory Visit: Payer: Medicaid Other

## 2023-08-05 ENCOUNTER — Ambulatory Visit: Payer: Self-pay | Admitting: Internal Medicine

## 2023-08-05 NOTE — Telephone Encounter (Signed)
 2nd attempt, pt called, message received "call cannot be completed as dialed"

## 2023-08-05 NOTE — Telephone Encounter (Signed)
 Patient called, recording this call could not be completed as dialed.    Copied From CRM [phone]. Reason for Triage: Patient has a lump on the inside of his nose and he is also suffering from cellulitis. Patients call back # [phone]

## 2023-08-05 NOTE — Telephone Encounter (Signed)
 Pt called x 2, received message "call cannot be completed as dialed... ". Will try again later.   Copied From CRM [id number]. Reason for Triage: Patient has a lump on the inside of his nose and he is also suffering from cellulitis. Patients call back # [phone]

## 2023-08-05 NOTE — Telephone Encounter (Signed)
 Copied from CRM [id number]. Topic: Clinical - Pink Word Triage >> Aug 05, 2023  4:15 PM Alvino Blood C wrote: Reason for Triage: Patient has a lump on the inside of his nose and he is also suffering from cellulitis. Patients call back # [phone]   Chief Complaint: Lump in let nostril Symptoms: Painful lump in left nostril, foul smell from previous cellulitis site  Frequency: Constant  Pertinent Negatives: Patient denies new difficulty breathing or fever  Disposition: [] ED /[] Urgent Care (no appt availability in office) / [x] Appointment(In office/virtual)/ []  [facility]/ [] Home Care/ [] Refused Recommended Disposition /[] [facility]/ []  Follow-up with PCP Additional Notes: Patient reports that yesterday he began to experience a painful lump in his left nostril. He states that there is some external swelling as well. He states he is also concerned about his recent cellulitis returning due to an odor from the area. Patient denies any fever or new shortness of breath. Appointment made for the patient on Friday     Reason for Disposition  [1] Swelling is painful to touch AND [2] no fever    48 hour appointment  Answer Assessment - Initial Assessment Questions 1. APPEARANCE of SWELLING: "What does it look like?"     Swelling on outside and inside of left nostril  2. SIZE: "How large is the swelling?" (e.g., inches, cm; or compare to size of pinhead, tip of pen, eraser, coin, pea, grape, ping pong ball)      Dime 3. LOCATION: "Where is the swelling located?"     Left nostril  4. ONSET: "When did the swelling start?"     Yesterday  5. COLOR: "What color is it?" "Is there more than one color?"     No 6. PAIN: "Is there any pain?" If Yes, ask: "How bad is the pain?" (e.g., scale 1-10; or mild, moderate, severe)     - NONE (0): no pain   - MILD (1-3): doesn't interfere with normal activities    - MODERATE (4-7): interferes with normal activities or awakens from sleep    -  SEVERE (8-10): excruciating pain, unable to do any normal activities     10/10 with palpation, 5/10 otherwise  7. ITCH: "Does it itch?" If Yes, ask: "How bad is the itch?"      No 8. CAUSE: "What do you think caused the swelling?"      No 9 OTHER SYMPTOMS: "Do you have any other symptoms?" (e.g., fever)     No  Protocols used: Skin Lump or Localized Swelling-A-AH

## 2023-08-06 ENCOUNTER — Encounter: Payer: Self-pay | Admitting: Physical Medicine and Rehabilitation

## 2023-08-06 ENCOUNTER — Other Ambulatory Visit: Payer: Self-pay | Admitting: Physical Medicine and Rehabilitation

## 2023-08-06 ENCOUNTER — Telehealth: Payer: Self-pay

## 2023-08-06 ENCOUNTER — Telehealth: Payer: Self-pay | Admitting: Primary Care

## 2023-08-06 ENCOUNTER — Ambulatory Visit: Payer: Medicaid Other | Admitting: Podiatry

## 2023-08-06 NOTE — Telephone Encounter (Signed)
 Patient left a message on MyChart   "I'm still feeling like I have a cold I'm congested"

## 2023-08-06 NOTE — Telephone Encounter (Signed)
 Per Dr.Iruku, at the [facility] , patient must stay on Eliquis till June due to health concerns

## 2023-08-07 ENCOUNTER — Encounter: Payer: Self-pay | Admitting: Nurse Practitioner

## 2023-08-07 ENCOUNTER — Ambulatory Visit: Payer: Medicaid Other | Attending: Nurse Practitioner | Admitting: Nurse Practitioner

## 2023-08-07 VITALS — BP 106/68 | HR 83 | Resp 19 | Ht 72.0 in | Wt 293.6 lb

## 2023-08-07 DIAGNOSIS — B356 Tinea cruris: Secondary | ICD-10-CM

## 2023-08-07 DIAGNOSIS — R0981 Nasal congestion: Secondary | ICD-10-CM

## 2023-08-07 DIAGNOSIS — J069 Acute upper respiratory infection, unspecified: Secondary | ICD-10-CM

## 2023-08-07 DIAGNOSIS — I10 Essential (primary) hypertension: Secondary | ICD-10-CM | POA: Diagnosis not present

## 2023-08-07 MED ORDER — BLOOD PRESSURE MONITORING DEVI: 0 refills | Status: DC

## 2023-08-07 MED ORDER — BENZONATATE 200 MG PO CAPS
200.0000 mg | ORAL_CAPSULE | Freq: Two times a day (BID) | ORAL | 1 refills | Status: DC | PRN
Start: 2023-08-07 — End: 2023-10-08

## 2023-08-07 MED ORDER — NYSTATIN 100000 UNIT/ML MT SUSP: 5.0000 mL | Freq: Four times a day (QID) | OROMUCOSAL | 3 refills | Status: DC

## 2023-08-07 MED ORDER — FLUTICASONE PROPIONATE 50 MCG/ACT NA SUSP: NASAL | 5 refills | Status: DC

## 2023-08-07 NOTE — Telephone Encounter (Signed)
ATC x2.  LVM to return call.

## 2023-08-07 NOTE — Patient Instructions (Addendum)
 Call [facility] for your blood pressure machine [phone]  [facility] Located in: [facility] Address: [address] [place], [place] [address] Phone: [phone]

## 2023-08-07 NOTE — Progress Notes (Signed)
 Assessment & Plan:  Zerek was seen today for medical management of chronic issues.  Diagnoses and all orders for this visit:  Jock itch -     nystatin (MYCOSTATIN/NYSTOP) powder; Apply 1 Application topically 3 (three) times daily.  Primary hypertension -     Blood Pressure Monitoring DEVI; Dispense 1 bp monitor, check daily.  URI with cough and congestion -     benzonatate (TESSALON) 200 MG capsule; Take 1 capsule (200 mg total) by mouth 2 (two) times daily as needed for cough.  Nasal congestion -     fluticasone (FLONASE) 50 MCG/ACT nasal spray; Use 1-2 sprays in each nostril once a day as needed for nasal congestion.    Patient has been counseled on age-appropriate routine health concerns for screening and prevention. These are reviewed and up-to-date. Referrals have been placed accordingly. Immunizations are up-to-date or declined.    Subjective:   Chief Complaint  Patient presents with   Medical Management of Chronic Issues    [name] 58 y.o. male presents to office today for several concerns. He is a patient of Dr. 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    He endorses itching in his scrotal area along with odor. There are no signs of cellulitis today. He has been treated in the past with nystatin and notes effectiveness with using. He has been using microguard OTC with no improvement.   He was prescribed doxy for Bronchitis on 07-09-2023. States he is still coughing and would like tessalon today. Denies any other URI symptoms.     HTN Blood pressure is well controlled.  BP Readings from Last 3 Encounters:  08/07/23 106/68  07/30/23 126/80  07/09/23 98/64     Nasal congestion He also reports feeling a "knot" in the left nostril. It has been present for a few weeks now.  Review of Systems  Constitutional:  Negative for fever, malaise/fatigue and weight loss.  HENT:  Positive for congestion. Negative for nosebleeds.   Eyes: Negative.  Negative for blurred vision, double vision and photophobia.  Respiratory:  Positive for cough. Negative for shortness of breath.   Cardiovascular: Negative.  Negative for chest pain, palpitations and leg swelling.  Gastrointestinal: Negative.  Negative for heartburn, nausea and vomiting.  Musculoskeletal: Negative.  Negative for myalgias.  Skin:  Positive for itching and rash.  Neurological: Negative.  Negative for dizziness, focal weakness, seizures and headaches.  Psychiatric/Behavioral: Negative.  Negative for suicidal ideas.     Past Medical History:  Diagnosis Date   AICD (automatic cardioverter/defibrillator) present    Chronic bronchitis (HCC)    "get it most q yr" (12/23/2013)   Chronic systolic (congestive) heart failure (HCC)    Fracture of left humerus    a. 07/2013.   GERD (gastroesophageal reflux disease)    not at present time   Heart murmur    "born w/it"    History of renal calculi    Hypertension    Kidney stones    NICM (nonischemic cardiomyopathy) (HCC)    a. 07/2013 Echo: EF 20-25%, diff HK, Gr2 DD, mild MR, mod dil LA/RA. 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    Syncope    Type II diabetes mellitus (HCC) 2006    Past Surgical History:  Procedure Laterality Date   CARDIAC CATHETERIZATION  08/10/13   CHOLECYSTECTOMY  ~ 2010   IMPLANTABLE CARDIOVERTER DEFIBRILLATOR IMPLANT  12/23/2013   STJ Fortify  ICD implanted by Dr Johney Frame for cardiomyopathy and syncope   IMPLANTABLE CARDIOVERTER DEFIBRILLATOR IMPLANT N/A 12/23/2013   Procedure: IMPLANTABLE CARDIOVERTER DEFIBRILLATOR IMPLANT;  Surgeon: Gardiner Rhyme, MD;  Location: [facility] CATH LAB;  Service: Cardiovascular;  Laterality: N/A;   INGUINAL HERNIA REPAIR Left 03/01/2018   Procedure: OPEN REPAIR OF LEFT INGUINAL HERNIA WITH MESH;  Surgeon: Gaynelle Adu, MD;  Location: [facility] ORS;  Service: General;  Laterality: Left;   LEAD REVISION/REPAIR N/A 05/15/2022   Procedure: LEAD REVISION/REPAIR;  Surgeon: Lanier Prude, MD;  Location: [facility] INVASIVE CV LAB;  Service: Cardiovascular;  Laterality: N/A;   LEFT HEART CATHETERIZATION WITH CORONARY ANGIOGRAM N/A 08/10/2013   Procedure: LEFT HEART CATHETERIZATION WITH CORONARY ANGIOGRAM;  Surgeon: Corky Crafts, MD;  Location: [facility] CATH LAB;  Service: Cardiovascular;  Laterality: N/A;   ORIF HUMERUS FRACTURE Left 08/12/2013   Procedure: OPEN REDUCTION INTERNAL FIXATION (ORIF) HUMERAL SHAFT FRACTURE;  Surgeon: Nadara Mustard, MD;  Location: [facility] OR;  Service: Orthopedics;  Laterality: Left;  Open Reduction Internal Fixation Left Humerus   RIGHT/LEFT HEART CATH AND CORONARY ANGIOGRAPHY N/A 05/12/2022   Procedure: RIGHT/LEFT HEART CATH AND CORONARY ANGIOGRAPHY;  Surgeon: Dorthula Nettles, DO;  Location: [facility] INVASIVE CV LAB;  Service: Cardiovascular;  Laterality: N/A;   URETEROSCOPY     "laser for kidney stones"    Family History  Problem Relation Age of Onset   Diabetes Father    Arthritis Father    Hypertension Father    Diabetes Mother    Arthritis Mother    Hypertension Mother    Hypertension Brother     Social History Reviewed with no changes to be made today.   Outpatient Medications Prior to Visit  Medication Sig Dispense Refill   Accu-Chek Softclix Lancets lancets Use as instructed 100 each 12   acetaminophen (TYLENOL) 500 MG tablet Take 1,000 mg by mouth every 8 (eight) hours as needed.      acetaminophen-codeine (TYLENOL #3) 300-30 MG tablet Take 1 tablet by mouth every 8 (eight) hours as needed for moderate pain (pain score 4-6) or severe pain (pain score 7-10). 20 tablet 0   albuterol (VENTOLIN HFA) 108 (90 Base) MCG/ACT inhaler Inhale 2 puffs into the lungs every 6 (six) hours as needed for wheezing or shortness of breath. 18 g 2   amiodarone (PACERONE) 200 MG tablet Take 1 tablet (200 mg total) by mouth daily. 90 tablet 3   apixaban (ELIQUIS) 5 MG TABS tablet Take 1 tablet (5 mg total) by mouth 2 (two) times daily. 60 tablet 3   atorvastatin (LIPITOR) 10 MG tablet Take 1 tablet (10 mg total) by mouth daily. 90 tablet 1   Blood Glucose Monitoring Suppl (ACCU-CHEK GUIDE) w/Device KIT UAD 1 kit 0   Blood Glucose Monitoring Suppl DEVI 1 each by Does not apply route in the morning, at noon, and at bedtime. May substitute to any manufacturer covered by patient's insurance. 1 each 0   cetirizine (EQ ALLERGY RELIEF, CETIRIZINE,) 10 MG tablet Take 1 tablet (10 mg total) by mouth daily. (Patient taking differently: Take 10 mg  by mouth in the morning.) 90 tablet 1   cholecalciferol (VITAMIN D3) 25 MCG (1000 UNIT) tablet Take 1,000 Units by mouth in the morning.     clonazePAM (KLONOPIN) 0.5 MG tablet Take 1 tablet (0.5 mg total) by mouth 2 (two) times daily as needed for anxiety. 60 tablet 1   Clotrimazole 1 % OINT Apply to the affected area twice a day 56.7 g 0   Continuous Glucose Receiver (FREESTYLE LIBRE 2 READER) DEVI USE AS DIRECTED 1 each 0   Continuous Glucose Sensor (FREESTYLE LIBRE 2 SENSOR) MISC Use to check blood sugar TID. Change sensor once every 14 days. E11.65 2 each 11   EPINEPHrine 0.3 mg/0.3 mL IJ SOAJ injection Inject 0.3 mg into the muscle as needed. 1 each 1   famotidine (PEPCID) 20 MG tablet TAKE 1 TABLET BY MOUTH 1 TO 2 TIMES DAILY AS NEEDED 180 tablet 0   Fluticasone-Umeclidin-Vilant (TRELEGY ELLIPTA) 100-62.5-25 MCG/ACT AEPB Inhale 1 puff into the lungs daily. 1 each  11   gabapentin (NEURONTIN) 300 MG capsule Take 1 capsule (300 mg total) by mouth at bedtime. 90 capsule 1   glucose blood (ACCU-CHEK GUIDE) test strip Use as instructed 100 each 12   glucose blood (FREESTYLE LITE) test strip Use as instructed.  Please make sure this is compatible with [facility] 2 reader. 100 each 12   insulin aspart (NOVOLOG) 100 UNIT/ML injection Inject 24 Units into the skin 3 (three) times daily with meals.     insulin glargine (LANTUS SOLOSTAR) 100 UNIT/ML Solostar Pen Inject 42 Units into the skin daily. (Patient taking differently: Inject 36 Units into the skin daily.)     Insulin Pen Needle (B-D ULTRAFINE III SHORT PEN) 31G X 8 MM MISC USE AS DIRECTED 100 each 6   Lancet Devices (ACCU-CHEK SOFTCLIX) lancets 1 each by Other route 3 (three) times daily. 1 each 0   methocarbamol (ROBAXIN) 500 MG tablet Take 1 tablet (500 mg total) by mouth daily. (Patient taking differently: Take 500 mg by mouth at bedtime.) 90 tablet 1   metolazone (ZAROXOLYN) 5 MG tablet Take 1 tablet (5 mg total) by mouth once a week.     metoprolol succinate (TOPROL-XL) 50 MG 24 hr tablet Take 1 tablet (50 mg total) by mouth daily. 90 tablet 3   potassium chloride SA (KLOR-CON M) 20 MEQ tablet Take 2 tablets (40 mEq total) by mouth daily. 60 tablet 3   sacubitril-valsartan (ENTRESTO) 49-51 MG Take 1 tablet by mouth 2 (two) times daily. 60 tablet 3   sertraline (ZOLOFT) 50 MG tablet Take 1.5 tablets (75 mg total) by mouth in the morning. 45 tablet 2   tamsulosin (FLOMAX) 0.4 MG CAPS capsule Take 2 capsules (0.8 mg total) by mouth daily. 180 capsule 1   torsemide (DEMADEX) 20 MG tablet Take 4 tabs (80 mg) in AM and 2 tabs (40 mg) in PM     traZODone (DESYREL) 50 MG tablet Take 1 tablet (50 mg total) by mouth at bedtime. 30 tablet 2   Blood Pressure Monitoring DEVI Dispense 1 bp monitor, check daily. 1 each 0   fluticasone (FLONASE) 50 MCG/ACT nasal spray Use 1-2 sprays in each nostril once a day as needed for  nasal congestion. 16 g 5   No facility-administered medications prior to visit.    Allergies  Allergen Reactions   Bee Venom Anaphylaxis   Trulicity [Dulaglutide] Anaphylaxis and Diarrhea    Extra dose of Trulicity caused cardiac arrest. Patient experience onset  of diarrhea with using this medication - cleared after med stopped.       Objective:    BP 106/68 (BP Location: Left Arm, Patient Position: Sitting, Cuff Size: Normal)   Pulse 83   Resp 19   Ht 6' (1.829 m)   Wt 293 lb 9.6 oz (133.2 kg)   SpO2 94%   BMI 39.82 kg/m  Wt Readings from Last 3 Encounters:  08/07/23 293 lb 9.6 oz (133.2 kg)  07/30/23 296 lb 2 oz (134.3 kg)  07/09/23 299 lb 3.2 oz (135.7 kg)    Physical Exam Vitals and nursing note reviewed.  Constitutional:      Appearance: He is well-developed.  HENT:     Head: Normocephalic and atraumatic.     Nose:     Right Turbinates: Enlarged, swollen and pale.     Left Turbinates: Enlarged, swollen and pale.     Comments: Anterior nasal turbinates significantly swollen and pale.  Cardiovascular:     Rate and Rhythm: Normal rate and regular rhythm.     Heart sounds: Normal heart sounds. No murmur heard.    No friction rub. No gallop.  Pulmonary:     Effort: Pulmonary effort is normal. No tachypnea or respiratory distress.     Breath sounds: Normal breath sounds. No decreased breath sounds, wheezing, rhonchi or rales.  Chest:     Chest wall: No tenderness.  Abdominal:     General: Bowel sounds are normal.     Palpations: Abdomen is soft.  Genitourinary:      Comments: Red moist rash with satellite lesions present. Musculoskeletal:        General: Normal range of motion.     Cervical back: Normal range of motion.  Skin:    General: Skin is warm and dry.  Neurological:     Mental Status: He is alert and oriented to person, place, and time.     Coordination: Coordination normal.  Psychiatric:        Behavior: Behavior normal. Behavior is  cooperative.        Thought Content: Thought content normal.        Judgment: Judgment normal.          Patient has been counseled extensively about nutrition and exercise as well as the importance of adherence with medications and regular follow-up. The patient was given clear instructions to go to ER or return to medical center if symptoms don't improve, worsen or new problems develop. The patient verbalized understanding.   Follow-up: Return if symptoms worsen or fail to improve.   Claiborne Rigg, FNP-BC [facility] [place], [place] [phone]   08/08/2023, 11:36 PM

## 2023-08-07 NOTE — Telephone Encounter (Signed)
ATC x1.  LVM to return call. 

## 2023-08-08 ENCOUNTER — Encounter: Payer: Self-pay | Admitting: Nurse Practitioner

## 2023-08-08 MED ORDER — NYSTATIN 100000 UNIT/GM EX POWD
1.0000 | Freq: Three times a day (TID) | CUTANEOUS | 1 refills | Status: DC
Start: 2023-08-08 — End: 2023-09-30

## 2023-08-10 DIAGNOSIS — F411 Generalized anxiety disorder: Secondary | ICD-10-CM | POA: Diagnosis not present

## 2023-08-10 DIAGNOSIS — F4312 Post-traumatic stress disorder, chronic: Secondary | ICD-10-CM | POA: Diagnosis not present

## 2023-08-10 NOTE — Telephone Encounter (Signed)
 I called and spoke with pt. I informed pt of Beth's note. Pt verbalized understanding. NFN. Routing to front desk to put on cancellation list.

## 2023-08-10 NOTE — Telephone Encounter (Signed)
 Patient should reach out to PCP or go to UC, unfortunately we do not have openings prior to apt date. He can be placed on cancellation list

## 2023-08-10 NOTE — Telephone Encounter (Signed)
 I called and spoke to pt. Pt states he had sinus congestion but pcp gave him Flonase. Pt sates she has mild chest congestion now and is coughing up mucous. Yellow or clear mucous. Pt states sometimes there is blood in it. Pt states he does have s.o.b, and when he get's out of the shower or walking, he starts gasping for air. Pt states he is still using all his inhalers, but would like an appointment to discuss his medications and to be shown which ones to use to help him breathe.   Pt is scheduled to see Waynetta Sandy, NP on 09-15-23 but pt is wanting an earlier appointment if possible. Please advise.

## 2023-08-12 ENCOUNTER — Other Ambulatory Visit: Payer: Self-pay | Admitting: Internal Medicine

## 2023-08-12 MED ORDER — PREDNISONE 10 MG PO TABS: ORAL_TABLET | ORAL | 0 refills | Status: DC

## 2023-08-13 ENCOUNTER — Encounter: Payer: Self-pay | Admitting: Internal Medicine

## 2023-08-13 ENCOUNTER — Telehealth (HOSPITAL_COMMUNITY): Payer: Self-pay

## 2023-08-13 ENCOUNTER — Encounter (HOSPITAL_COMMUNITY): Payer: Self-pay

## 2023-08-13 ENCOUNTER — Ambulatory Visit: Payer: Medicaid Other | Admitting: Internal Medicine

## 2023-08-13 VITALS — BP 120/60 | HR 74 | Ht 72.0 in | Wt 297.6 lb

## 2023-08-13 DIAGNOSIS — E1169 Type 2 diabetes mellitus with other specified complication: Secondary | ICD-10-CM | POA: Diagnosis not present

## 2023-08-13 DIAGNOSIS — E1159 Type 2 diabetes mellitus with other circulatory complications: Secondary | ICD-10-CM | POA: Diagnosis not present

## 2023-08-13 DIAGNOSIS — Z7984 Long term (current) use of oral hypoglycemic drugs: Secondary | ICD-10-CM

## 2023-08-13 DIAGNOSIS — E669 Obesity, unspecified: Secondary | ICD-10-CM | POA: Diagnosis not present

## 2023-08-13 DIAGNOSIS — E785 Hyperlipidemia, unspecified: Secondary | ICD-10-CM

## 2023-08-13 DIAGNOSIS — E1165 Type 2 diabetes mellitus with hyperglycemia: Secondary | ICD-10-CM | POA: Diagnosis not present

## 2023-08-13 DIAGNOSIS — Z794 Long term (current) use of insulin: Secondary | ICD-10-CM | POA: Diagnosis not present

## 2023-08-13 MED ORDER — LANTUS SOLOSTAR 100 UNIT/ML ~~LOC~~ SOPN: 45.0000 [IU] | PEN_INJECTOR | Freq: Every day | SUBCUTANEOUS | 3 refills | Status: DC

## 2023-08-13 MED ORDER — NOVOLOG FLEXPEN 100 UNIT/ML ~~LOC~~ SOPN: 30.0000 [IU] | PEN_INJECTOR | Freq: Three times a day (TID) | SUBCUTANEOUS | 3 refills | Status: DC

## 2023-08-13 NOTE — Telephone Encounter (Signed)
 Attempted to call patient in regards to Cardiac Rehab - LM on VM Mailed letter

## 2023-08-13 NOTE — Progress Notes (Signed)
 Patient ID: [name], male   DOB: 1965-10-15, 58 y.o.   MRN: [id number]  HPI: [name] is a 58 y.o.-year-old male, initially referred by his PCP, Dr. Laural Benes, returning for follow-up for DM2, dx in 2010-15, insulin-dependent since 2023, uncontrolled, with complications (nonischemic cardiomyopathy, CHF with reduced ejection fraction, history of ventricular tachycardia, history of cardiac arrest, s/p ICD placement, stage IIIb CKD).  Last visit 4 months ago.  Interim history: No increased urination, blurry vision, nausea, chest pain.  Before last visit he stopped drinking sweet tea but switched to cranberry juice. Now drinking less. He is on steroids (Prednsone 40 mg daily) for gout and sciatica.  He also has significant shortness of breath that hinders his moving around.  He is having insomnia.  His psychiatrist started him on trazodone, but this is not working.  Reviewed HbA1c: Lab Results  Component Value Date   HGBA1C 8.9 (A) 07/09/2023   HGBA1C 9.3 (H) 04/03/2023   HGBA1C 9.1 (A) 02/13/2023   HGBA1C 8.9 12/12/2022   HGBA1C 7.9 (A) 09/18/2022   HGBA1C 10.2 (H) 05/04/2022   HGBA1C 15.0 (A) 01/30/2022   HGBA1C 13.0 (H) 08/26/2021   HGBA1C 8.9 (A) 03/19/2021   HGBA1C 8.2 (H) 07/23/2020   Pt is on a regimen of: - Farxiga 10 mg before breakfast - Lantus 22 >> 26 >> 30 >> 36 >> 42 >> 38 units at bedtime - Novolog 12 >> 8-12 >> 28 (steroids) >> 16 units >> 14-18 >> 24-26 >> 30-34 units 2x a day, before meals He reported diarrhea after increased Trulicity >> hypokalemia >> cardiac arrest - was in ICU for 16 days, had heart surgery >> now PTSD.  Pt checks his sugars >4x a day  - with the CGM receiver:  Previously:  Prev.:   Lowest sugar was 69 >> 69 >> 69 >> 58; he has hypoglycemia awareness at 70.  Highest sugar was 300 (forgot insulin) >> 340 (steroids) >> 380 >> >400.  Glucometer: Accu-Chek guide  Pt's meals are: - Breakfast: muffins, 3 eggs - Lunch: skips - Dinner:  salad from [employer] - tuna, onions, olives, crackers + cookie - Snacks: Previously also drinking regular sodas and sweet tea, which she stopped.  - + CKD - sees nephrology ([facility]), last BUN/creatinine:  Lab Results  Component Value Date   BUN 36 (H) 07/30/2023   BUN 27 (H) 05/21/2023   CREATININE 2.74 (H) 07/30/2023   CREATININE 2.56 (H) 05/21/2023  On Farxiga 10 mg daily, Entresto.  -+ HL; last set of lipids: Lab Results  Component Value Date   CHOL 166 02/13/2023   HDL 44 02/13/2023   LDLCALC 76 02/13/2023   TRIG 283 (H) 02/13/2023   CHOLHDL 3.8 02/13/2023  On Lipitor 10 mg daily.  - last eye exam was on 09/23/2022. No DR. Sees Dr. Dione Booze.  - no numbness and tingling in his feet. On Neurontin 300 mg at bedtime.  Last foot exam 09/2022. Sees Dr. Jamse Arn 08/20/2023.  Pt has FH of DM in mother and father.  He also has a history of HTN, kidney stones, GERD, IBS, gout, chronic low back pain (spondylosis and spinal stenosis), Since 04/2022, after his cardiac arrest, he has been SOB and has gained 30 lbs.  He also has anxiety and PTSD.   He is on amiodarone, ASA 81.  ROS: + See HPI  Past Medical History:  Diagnosis Date   AICD (automatic cardioverter/defibrillator) present    Chronic bronchitis (HCC)    "get  it most q yr" (12/23/2013)   Chronic systolic (congestive) heart failure (HCC)    Fracture of left humerus    a. 07/2013.   GERD (gastroesophageal reflux disease)    not at present time   Heart murmur    "born w/it"    History of renal calculi    Hypertension    Kidney stones    NICM (nonischemic cardiomyopathy) (HCC)    a. 07/2013 Echo: EF 20-25%, diff HK, Gr2 DD, mild MR, mod dil LA/RA. 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    Shockable heart rhythm detected by automated external defibrillator    Syncope    Type II diabetes mellitus (HCC) 2006   Past Surgical History:  Procedure Laterality  Date   CARDIAC CATHETERIZATION  08/10/13   CHOLECYSTECTOMY  ~ 2010   IMPLANTABLE CARDIOVERTER DEFIBRILLATOR IMPLANT  12/23/2013   STJ Fortify ICD implanted by Dr Johney Frame for cardiomyopathy and syncope   IMPLANTABLE CARDIOVERTER DEFIBRILLATOR IMPLANT N/A 12/23/2013   Procedure: IMPLANTABLE CARDIOVERTER DEFIBRILLATOR IMPLANT;  Surgeon: Gardiner Rhyme, MD;  Location: [facility] CATH LAB;  Service: Cardiovascular;  Laterality: N/A;   INGUINAL HERNIA REPAIR Left 03/01/2018   Procedure: OPEN REPAIR OF LEFT INGUINAL HERNIA WITH MESH;  Surgeon: Gaynelle Adu, MD;  Location: [facility] ORS;  Service: General;  Laterality: Left;   LEAD REVISION/REPAIR N/A 05/15/2022   Procedure: LEAD REVISION/REPAIR;  Surgeon: Lanier Prude, MD;  Location: [facility] INVASIVE CV LAB;  Service: Cardiovascular;  Laterality: N/A;   LEFT HEART CATHETERIZATION WITH CORONARY ANGIOGRAM N/A 08/10/2013   Procedure: LEFT HEART CATHETERIZATION WITH CORONARY ANGIOGRAM;  Surgeon: Corky Crafts, MD;  Location: [facility] CATH LAB;  Service: Cardiovascular;  Laterality: N/A;   ORIF HUMERUS FRACTURE Left 08/12/2013   Procedure: OPEN REDUCTION INTERNAL FIXATION (ORIF) HUMERAL SHAFT FRACTURE;  Surgeon: Nadara Mustard, MD;  Location: [facility] OR;  Service: Orthopedics;  Laterality: Left;  Open Reduction Internal Fixation Left Humerus   RIGHT/LEFT HEART CATH AND CORONARY ANGIOGRAPHY N/A 05/12/2022   Procedure: RIGHT/LEFT HEART CATH AND CORONARY ANGIOGRAPHY;  Surgeon: Dorthula Nettles, DO;  Location: [facility] INVASIVE CV LAB;  Service: Cardiovascular;  Laterality: N/A;   URETEROSCOPY     "laser for kidney stones"   Social History   Socioeconomic History   Marital status: Single    Spouse name: Not on file   Number of children: Not on file   Years of education: Not on file   Highest education level: Not on file  Occupational History   Occupation: Disabled  Tobacco Use   Smoking status: Former    Current packs/day: 0.00    Average packs/day: 1 pack/day for 28.0 years (28.0 ttl  pk-yrs)    Types: Cigarettes    Start date: 06/03/1986    Quit date: 06/03/2014    Years since quitting: 9.2   Smokeless tobacco: Never  Vaping Use   Vaping status: Never Used  Substance and Sexual Activity   Alcohol use: Not Currently   Drug use: Yes    Types: Marijuana    Comment: occasionally;   Sexual activity: Yes  Other Topics Concern   Not on file  Social History Narrative   Pt lives with his brother    Social Drivers of Health   Financial Resource Strain: Low Risk  (05/25/2023)   Overall Financial Resource Strain (CARDIA)    Difficulty of Paying Living Expenses: Not hard at all  Recent Concern:  Financial Resource Strain - High Risk (03/24/2023)   Overall Financial Resource Strain (CARDIA)    Difficulty of Paying Living Expenses: Hard  Food Insecurity: No Food Insecurity (05/25/2023)   Hunger Vital Sign    Worried About Running Out of Food in the Last Year: Never true    Ran Out of Food in the Last Year: Never true  Transportation Needs: No Transportation Needs (05/25/2023)   PRAPARE - Administrator, Civil Service (Medical): No    Lack of Transportation (Non-Medical): No  Physical Activity: Inactive (05/25/2023)   Exercise Vital Sign    Days of Exercise per Week: 0 days    Minutes of Exercise per Session: 0 min  Stress: Stress Concern Present (05/25/2023)   [employer] of Occupational Health - Occupational Stress Questionnaire    Feeling of Stress : Rather much  Social Connections: Moderately Integrated (05/25/2023)   Social Connection and Isolation Panel [NHANES]    Frequency of Communication with Friends and Family: More than three times a week    Frequency of Social Gatherings with Friends and Family: More than three times a week    Attends Religious Services: More than 4 times per year    Active Member of [employer] or Organizations: Yes    Attends Banker Meetings: 1 to 4 times per year    Marital Status: Never married  Intimate  Partner Violence: Not At Risk (05/25/2023)   Humiliation, Afraid, Rape, and Kick questionnaire    Fear of Current or Ex-Partner: No    Emotionally Abused: No    Physically Abused: No    Sexually Abused: No   Current Outpatient Medications on File Prior to Visit  Medication Sig Dispense Refill   predniSONE (DELTASONE) 10 MG tablet 2 tabs PO daily x 3 days then 1 tab daily x 3 days then 1/2 tab daily x 3 days 11 tablet 0   Accu-Chek Softclix Lancets lancets Use as instructed 100 each 12   acetaminophen (TYLENOL) 500 MG tablet Take 1,000 mg by mouth every 8 (eight) hours as needed.     acetaminophen-codeine (TYLENOL #3) 300-30 MG tablet Take 1 tablet by mouth every 8 (eight) hours as needed for moderate pain (pain score 4-6) or severe pain (pain score 7-10). 20 tablet 0   albuterol (VENTOLIN HFA) 108 (90 Base) MCG/ACT inhaler Inhale 2 puffs into the lungs every 6 (six) hours as needed for wheezing or shortness of breath. 18 g 2   amiodarone (PACERONE) 200 MG tablet Take 1 tablet (200 mg total) by mouth daily. 90 tablet 3   apixaban (ELIQUIS) 5 MG TABS tablet Take 1 tablet (5 mg total) by mouth 2 (two) times daily. 60 tablet 3   atorvastatin (LIPITOR) 10 MG tablet Take 1 tablet (10 mg total) by mouth daily. 90 tablet 1   benzonatate (TESSALON) 200 MG capsule Take 1 capsule (200 mg total) by mouth 2 (two) times daily as needed for cough. 30 capsule 1   Blood Glucose Monitoring Suppl (ACCU-CHEK GUIDE) w/Device KIT UAD 1 kit 0   Blood Glucose Monitoring Suppl DEVI 1 each by Does not apply route in the morning, at noon, and at bedtime. May substitute to any manufacturer covered by patient's insurance. 1 each 0   Blood Pressure Monitoring DEVI Dispense 1 bp monitor, check daily. 1 each 0   cetirizine (EQ ALLERGY RELIEF, CETIRIZINE,) 10 MG tablet Take 1 tablet (10 mg total) by mouth daily. (Patient taking differently: Take 10 mg by  mouth in the morning.) 90 tablet 1   cholecalciferol (VITAMIN D3) 25 MCG  (1000 UNIT) tablet Take 1,000 Units by mouth in the morning.     clonazePAM (KLONOPIN) 0.5 MG tablet Take 1 tablet (0.5 mg total) by mouth 2 (two) times daily as needed for anxiety. 60 tablet 1   Clotrimazole 1 % OINT Apply to the affected area twice a day 56.7 g 0   Continuous Glucose Receiver (FREESTYLE LIBRE 2 READER) DEVI USE AS DIRECTED 1 each 0   Continuous Glucose Sensor (FREESTYLE LIBRE 2 SENSOR) MISC Use to check blood sugar TID. Change sensor once every 14 days. E11.65 2 each 11   EPINEPHrine 0.3 mg/0.3 mL IJ SOAJ injection Inject 0.3 mg into the muscle as needed. 1 each 1   famotidine (PEPCID) 20 MG tablet TAKE 1 TABLET BY MOUTH 1 TO 2 TIMES DAILY AS NEEDED 180 tablet 0   fluticasone (FLONASE) 50 MCG/ACT nasal spray Use 1-2 sprays in each nostril once a day as needed for nasal congestion. 16 g 5   Fluticasone-Umeclidin-Vilant (TRELEGY ELLIPTA) 100-62.5-25 MCG/ACT AEPB Inhale 1 puff into the lungs daily. 1 each 11   gabapentin (NEURONTIN) 300 MG capsule Take 1 capsule (300 mg total) by mouth at bedtime. 90 capsule 1   glucose blood (ACCU-CHEK GUIDE) test strip Use as instructed 100 each 12   glucose blood (FREESTYLE LITE) test strip Use as instructed.  Please make sure this is compatible with [facility] 2 reader. 100 each 12   insulin aspart (NOVOLOG) 100 UNIT/ML injection Inject 24 Units into the skin 3 (three) times daily with meals.     insulin glargine (LANTUS SOLOSTAR) 100 UNIT/ML Solostar Pen Inject 42 Units into the skin daily. (Patient taking differently: Inject 36 Units into the skin daily.)     Insulin Pen Needle (B-D ULTRAFINE III SHORT PEN) 31G X 8 MM MISC USE AS DIRECTED 100 each 6   Lancet Devices (ACCU-CHEK SOFTCLIX) lancets 1 each by Other route 3 (three) times daily. 1 each 0   methocarbamol (ROBAXIN) 500 MG tablet Take 1 tablet (500 mg total) by mouth daily. (Patient taking differently: Take 500 mg by mouth at bedtime.) 90 tablet 1   metolazone (ZAROXOLYN) 5 MG tablet Take 1  tablet (5 mg total) by mouth once a week.     metoprolol succinate (TOPROL-XL) 50 MG 24 hr tablet Take 1 tablet (50 mg total) by mouth daily. 90 tablet 3   nystatin (MYCOSTATIN/NYSTOP) powder Apply 1 Application topically 3 (three) times daily. 60 g 1   potassium chloride SA (KLOR-CON M) 20 MEQ tablet Take 2 tablets (40 mEq total) by mouth daily. 60 tablet 3   sacubitril-valsartan (ENTRESTO) 49-51 MG Take 1 tablet by mouth 2 (two) times daily. 60 tablet 3   sertraline (ZOLOFT) 50 MG tablet Take 1.5 tablets (75 mg total) by mouth in the morning. 45 tablet 2   tamsulosin (FLOMAX) 0.4 MG CAPS capsule Take 2 capsules (0.8 mg total) by mouth daily. 180 capsule 1   torsemide (DEMADEX) 20 MG tablet Take 4 tabs (80 mg) in AM and 2 tabs (40 mg) in PM     traZODone (DESYREL) 50 MG tablet Take 1 tablet (50 mg total) by mouth at bedtime. 30 tablet 2   No current facility-administered medications on file prior to visit.   Allergies  Allergen Reactions   Bee Venom Anaphylaxis   Trulicity [Dulaglutide] Anaphylaxis and Diarrhea    Extra dose of Trulicity caused cardiac arrest. Patient  experience onset of diarrhea with using this medication - cleared after med stopped.   Family History  Problem Relation Age of Onset   Diabetes Father    Arthritis Father    Hypertension Father    Diabetes Mother    Arthritis Mother    Hypertension Mother    Hypertension Brother    PE: BP 120/60   Pulse 74   Ht 6' (1.829 m)   Wt 297 lb 9.6 oz (135 kg)   SpO2 92%   BMI 40.36 kg/m  Wt Readings from Last 20 Encounters:  08/13/23 297 lb 9.6 oz (135 kg)  08/07/23 293 lb 9.6 oz (133.2 kg)  07/30/23 296 lb 2 oz (134.3 kg)  07/09/23 299 lb 3.2 oz (135.7 kg)  06/30/23 (!) 301 lb 6.4 oz (136.7 kg)  06/29/23 (!) 300 lb 12.8 oz (136.4 kg)  05/25/23 297 lb (134.7 kg)  05/21/23 294 lb 3.2 oz (133.4 kg)  05/13/23 297 lb 9.6 oz (135 kg)  05/06/23 299 lb (135.6 kg)  04/26/23 290 lb 9.6 oz (131.8 kg)  04/24/23 294 lb  (133.4 kg)  04/14/23 293 lb 3.2 oz (133 kg)  04/03/23 300 lb (136.1 kg)  03/16/23 292 lb (132.5 kg)  03/03/23 295 lb 12.8 oz (134.2 kg)  02/23/23 (!) 306 lb 9.6 oz (139.1 kg)  02/13/23 (!) 306 lb (138.8 kg)  12/12/22 286 lb (129.7 kg)  11/28/22 287 lb 3.2 oz (130.3 kg)   Constitutional: overweight, in NAD, short of breath, has difficulty moving to examiner's table Eyes:  EOMI, no exophthalmos ENT: no neck masses, no cervical lymphadenopathy Cardiovascular: RRR, No MRG Respiratory: CTA B Musculoskeletal: no deformities Skin:no rashes Neurological: no tremor with outstretched hands  ASSESSMENT: 1. DM2, insulin-dependent, uncontrolled, with long-term complications: - NICM - CHF with reduced ejection fraction-20 to 25% 08/2022 - history of VT + cardiac arrest 04/2022 (after developing diarrhea from increasing Trulicity dose), s/p ICD placement - stage IIIb CKD  PLAN:  1. Patient with longstanding, uncontrolled, type 2 diabetes, on oral antidiabetic regimen with SGLT2 inhibitor and basal/bolus regimen.  At last visit, HbA1c was 9.3%, but he had another HbA1c that was slightly lower, at 8.9% approximately 1 month ago. -At last visits, he declined a referral to nutrition but I strongly advised him to stop sweet tea and any sweet drinks and not to eat in the blood the night.   -At last visit, reviewing the CGM trends, sugars were more fluctuating than before, around the upper limit of the target range but with significant hyperglycemic spikes especially after lunch and lower spikes after dinner.  Upon questioning, he was drinking sweet drinks again and I recommended that he absolutely had to stop.  He was having dinner late, between 8 and 9 PM and sugars were higher afterwards. -Of note, he was intolerant to GLP-1 receptor agonist in the past. CGM interpretation: -At today's visit, we reviewed his CGM downloads: It appears that 29% of values are in target range (goal >70%), while 71% are  higher than 180 (goal <25%), and 0% are lower than 70 (goal <4%).  The calculated average blood sugar is 218.  The projected HbA1c for the next 3 months (GMI) is 8.5%. -Reviewing the CGM trends, sugars appear to be very high, almost entirely above target, widely fluctuating during the night and then improving during the day.  The sugars increase significantly after 9 PM and peak around 5 AM.  Upon questioning, he is on prednisone and he takes this  at night.  I explained that this could be the reason why he is not sleeping well and his sugars are higher at night.  I advised him to move the prednisone to the morning.  Since sugars are also high, he needs an increase in both insulins.  I am not sure if he still requires this much insulin after he stops prednisone, but at next visit we may need U-500 insulin if he continues to require large amounts of insulin. - I suggested to:  Patient Instructions  Please continue: - Farxiga 10 mg before breakfast  Please increase: - Lantus 45 units at bedtime - Novolog 30-38 units 2x a day, before meals (may need the higher dose of Novolog at night)  Move the Prednisone to morning.  Please return for another visit in 3 months.   - advised to check sugars at different times of the day - 4x a day, rotating check times - advised for yearly eye exams >> he is UTD - he is due for an annual foot exam but he has this in a week with Dr. Jamse Arn - return to clinic in 3 months  2. HL -Lipid panel was reviewed from 02/2023: LDL above our target of less than 55 due to cardiovascular disease, triglycerides elevated: Lab Results  Component Value Date   CHOL 166 02/13/2023   HDL 44 02/13/2023   LDLCALC 76 02/13/2023   TRIG 283 (H) 02/13/2023   CHOLHDL 3.8 02/13/2023  -He continues Lipitor 10 mg daily without side effects.   [name] Pavlov, MD PhD [facility] Endocrinology

## 2023-08-13 NOTE — Patient Instructions (Addendum)
 Please continue: - Farxiga 10 mg before breakfast  Please increase: - Lantus 45 units at bedtime - Novolog 30-38 units 2x a day, before meals (may need the higher dose of Novolog at night)  Move the Prednisone to morning.  Please return for another visit in 3-4 months.

## 2023-08-19 ENCOUNTER — Ambulatory Visit: Payer: Medicaid Other

## 2023-08-19 ENCOUNTER — Other Ambulatory Visit: Payer: Self-pay | Admitting: Internal Medicine

## 2023-08-19 ENCOUNTER — Other Ambulatory Visit: Payer: Self-pay

## 2023-08-19 DIAGNOSIS — I428 Other cardiomyopathies: Secondary | ICD-10-CM | POA: Diagnosis not present

## 2023-08-19 DIAGNOSIS — E1169 Type 2 diabetes mellitus with other specified complication: Secondary | ICD-10-CM

## 2023-08-19 DIAGNOSIS — N401 Enlarged prostate with lower urinary tract symptoms: Secondary | ICD-10-CM

## 2023-08-19 LAB — CUP PACEART REMOTE DEVICE CHECK
Battery Remaining Longevity: 89 mo
Battery Remaining Percentage: 83 %
Battery Voltage: 3.01 V
Brady Statistic AP VP Percent: 1 %
Brady Statistic AP VS Percent: 20 %
Brady Statistic AS VP Percent: 1 %
Brady Statistic AS VS Percent: 79 %
Brady Statistic RA Percent Paced: 20 %
Brady Statistic RV Percent Paced: 1 %
Date Time Interrogation Session: 20250312030027
HighPow Impedance: 74 Ohm
Implantable Lead Connection Status: 753985
Implantable Lead Connection Status: 753985
Implantable Lead Implant Date: 20150717
Implantable Lead Implant Date: 20231207
Implantable Lead Location: 753859
Implantable Lead Location: 753860
Implantable Pulse Generator Implant Date: 20231207
Lead Channel Impedance Value: 350 Ohm
Lead Channel Impedance Value: 400 Ohm
Lead Channel Pacing Threshold Amplitude: 0.375 V
Lead Channel Pacing Threshold Amplitude: 0.75 V
Lead Channel Pacing Threshold Pulse Width: 0.5 ms
Lead Channel Pacing Threshold Pulse Width: 0.5 ms
Lead Channel Sensing Intrinsic Amplitude: 1.7 mV
Lead Channel Sensing Intrinsic Amplitude: 12 mV
Lead Channel Setting Pacing Amplitude: 1.375
Lead Channel Setting Pacing Amplitude: 2 V
Lead Channel Setting Pacing Pulse Width: 0.5 ms
Lead Channel Setting Sensing Sensitivity: 0.5 mV
Pulse Gen Serial Number: 810051574
Zone Setting Status: 755011

## 2023-08-19 NOTE — Telephone Encounter (Signed)
 Copied from CRM [id number]. Topic: Clinical - Medication Refill >> Aug 19, 2023  2:17 PM Marlow Baars wrote: Most Recent Primary Care Visit:  Provider: Bertram Denver W  Department: [facility]  Visit Type: ACUTE  Date: 08/07/2023  Medication: tamsulosin (FLOMAX) 0.4 MG CAPS capsule, atorvastatin (LIPITOR) 10 MG tablet  Has the patient contacted their pharmacy? Yes   Is this the correct pharmacy for this prescription? Yes If no, delete pharmacy and type the correct one.  This is the patient's preferred pharmacy:  [facility] [address] [place], [place] - [address] [place] [address] Phone: [phone] Fax: [phone]   Has the prescription been filled recently? No  Is the patient out of the medication? Yes  Has the patient been seen for an appointment in the last year OR does the patient have an upcoming appointment? Yes  Can we respond through MyChart? Yes  Please assist patient further as soon as possible

## 2023-08-20 ENCOUNTER — Ambulatory Visit (INDEPENDENT_AMBULATORY_CARE_PROVIDER_SITE_OTHER): Payer: Medicaid Other | Admitting: Podiatry

## 2023-08-20 ENCOUNTER — Encounter: Payer: Self-pay | Admitting: Podiatry

## 2023-08-20 ENCOUNTER — Encounter: Payer: Self-pay | Admitting: Cardiology

## 2023-08-20 DIAGNOSIS — M79675 Pain in left toe(s): Secondary | ICD-10-CM | POA: Diagnosis not present

## 2023-08-20 DIAGNOSIS — M79674 Pain in right toe(s): Secondary | ICD-10-CM | POA: Diagnosis not present

## 2023-08-20 DIAGNOSIS — B351 Tinea unguium: Secondary | ICD-10-CM

## 2023-08-20 DIAGNOSIS — E1142 Type 2 diabetes mellitus with diabetic polyneuropathy: Secondary | ICD-10-CM

## 2023-08-20 NOTE — Progress Notes (Signed)
  Subjective:  Patient ID: [name], male    DOB: July 04, 1965,  MRN: [id number]  Chief Complaint  Patient presents with   Diabetes    Diabetic foot care. Pt reports A1C 9 at last check sometimes higher. Wanted doctor to look at spots on bilateral great toes. Pt states that his feet itch.    58 y.o. male presents with the above complaint. History confirmed with patient. Patient presenting with pain related to dystrophic thickened elongated nails. Patient is unable to trim own nails related to nail dystrophy and/or mobility issues. Patient does have a history of T2DM.  Patient states that he has been using triamcinolone cream to both feet for many months now for itching feet.  He is requesting a refill today.  He does not appear to have any visible rash.  Objective:  Physical Exam: warm, good capillary refill nail exam onychomycosis of the toenails, onycholysis, and dystrophic nails DP pulses palpable, PT pulses palpable, and protective sensation absent Left Foot:  Pain with palpation of nails due to elongation and dystrophic growth.  Right Foot: Pain with palpation of nails due to elongation and dystrophic growth.  No redness, no signs of rash present today  Assessment:   1. Pain due to onychomycosis of toenails of both feet   2. Diabetic polyneuropathy associated with type 2 diabetes mellitus (HCC)      Plan:  Patient was evaluated and treated and all questions answered.  #Onychomycosis with pain  -Nails palliatively debrided as below. -Educated on self-care  Procedure: Nail Debridement Rationale: Pain Type of Debridement: manual, sharp debridement. Instrumentation: Nail nipper, rotary burr. Number of Nails: 10  Patient had no clinical signs of rash today.  Upon chart review.  He had previously been prescribed ketoconazole cream for athlete's foot.  Possible that he did have resolution for some time.  Advised patient that I would like to see if he develops any noticeable  signs and symptoms if he stops using triamcinolone cream.  Instructed patient to follow-up if he notices any recurrence or changes.  Return in about 3 months (around 11/20/2023) for Diabetic Foot Care.         [name], DPM [facility]

## 2023-08-21 ENCOUNTER — Telehealth (HOSPITAL_COMMUNITY): Payer: Self-pay | Admitting: Cardiology

## 2023-08-23 NOTE — Progress Notes (Incomplete)
 ADVANCED HEART FAILURE CLINIC NOTE  Referring Physician: Marcine Matar, MD  Primary Care: Marcine Matar, MD Primary Cardiologist: Dr. Jens Som EP: Dr. Lalla Brothers  CC: [id number]  HPI: [name] is a 58 y.o. male with HFrEF (dx 2015) w/ LHC demonstrating normal coronary anatomy s/p primary prevention ICD, CKDIIIB & T2DM. He has been followed outpatient by Dr. Jens Som. In 2017 he had a CPX with w/ moderate functional impairment, peak VO2 of 15, VE/VCO2 of 35. Outpatient he has been on a stable regimen of Entresto/coreg/farxiga. From a functional standpoint, [name] reports running 2-3 miles daily until he was diagnosed with 'long covid' 7 months ago. Since that time he has seen a sharp decline in exercise capacity. In November 2023, he was admitted for VT believed to be secondary to significant electrolyte derrangements from and underlying right gluteal abscess now s/p IV antibiotics. Throughout his admission he had recurrent device shocks for VT. He underwent RHC/LHC that demonstrated normal coronary anatomy with RHC showing normal cardiac output and low filling pressures. He subsequently underwent placement of right atrial lead with improvement in ectopy after atrial pacing. TTE during admission with LVEF of 30-35%.   Interval history:  -Continues to remain emotional; he becomes quickly short of breath due to his COPD. Reports some improvement with an inhaler.  -Reports that he has some lightheadedness that is not associated with hypotension; possibly due to anxiety/panic attacks.  -From a functional standpoint, remains NYHA III. Reports that he becomes short of breath when walking > 10-47ft, however, he remains very inactive. He does not attempt any   Activity level/exercise tolerance: NYHA III, deconditioning  Orthopnea:  Sleeps on 2-3 Paroxysmal noctural dyspnea:  Yes Chest pain/pressure:  No Orthostatic lightheadedness:  No Palpitations:  No Lower extremity edema:   No Presyncope/syncope:  No Cough:  No   Current Outpatient Medications  Medication Sig Dispense Refill   Accu-Chek Softclix Lancets lancets Use as instructed 100 each 12   acetaminophen (TYLENOL) 500 MG tablet Take 1,000 mg by mouth every 8 (eight) hours as needed.     amiodarone (PACERONE) 200 MG tablet Take 1 tablet (200 mg total) by mouth daily. 90 tablet 3   apixaban (ELIQUIS) 5 MG TABS tablet Take 1 tablet (5 mg total) by mouth 2 (two) times daily. 60 tablet 3   atorvastatin (LIPITOR) 10 MG tablet Take 1 tablet by mouth once daily 90 tablet 0   benzonatate (TESSALON) 200 MG capsule Take 1 capsule (200 mg total) by mouth 2 (two) times daily as needed for cough. 30 capsule 1   Blood Glucose Monitoring Suppl (ACCU-CHEK GUIDE) w/Device KIT UAD 1 kit 0   Blood Glucose Monitoring Suppl DEVI 1 each by Does not apply route in the morning, at noon, and at bedtime. May substitute to any manufacturer covered by patient's insurance. 1 each 0   Blood Pressure Monitoring DEVI Dispense 1 bp monitor, check daily. 1 each 0   cetirizine (EQ ALLERGY RELIEF, CETIRIZINE,) 10 MG tablet Take 1 tablet (10 mg total) by mouth daily. (Patient taking differently: Take 10 mg by mouth in the morning.) 90 tablet 1   cholecalciferol (VITAMIN D3) 25 MCG (1000 UNIT) tablet Take 1,000 Units by mouth in the morning.     clonazePAM (KLONOPIN) 0.5 MG tablet Take 1 tablet (0.5 mg total) by mouth 2 (two) times daily as needed for anxiety. 60 tablet 1   Clotrimazole 1 % OINT Apply to the affected area twice a day 56.7  g 0   Continuous Glucose Receiver (FREESTYLE LIBRE 2 READER) DEVI USE AS DIRECTED 1 each 0   Continuous Glucose Sensor (FREESTYLE LIBRE 2 SENSOR) MISC Use to check blood sugar TID. Change sensor once every 14 days. E11.65 2 each 11   EPINEPHrine 0.3 mg/0.3 mL IJ SOAJ injection Inject 0.3 mg into the muscle as needed. 1 each 1   famotidine (PEPCID) 20 MG tablet TAKE 1 TABLET BY MOUTH 1 TO 2 TIMES DAILY AS NEEDED 180  tablet 0   fluticasone (FLONASE) 50 MCG/ACT nasal spray Use 1-2 sprays in each nostril once a day as needed for nasal congestion. 16 g 5   Fluticasone-Umeclidin-Vilant (TRELEGY ELLIPTA) 100-62.5-25 MCG/ACT AEPB Inhale 1 puff into the lungs daily. 1 each 11   gabapentin (NEURONTIN) 300 MG capsule Take 1 capsule (300 mg total) by mouth at bedtime. 90 capsule 1   glucose blood (FREESTYLE LITE) test strip Use as instructed.  Please make sure this is compatible with [facility] 2 reader. 100 each 12   insulin aspart (NOVOLOG FLEXPEN) 100 UNIT/ML FlexPen Inject 30-38 Units into the skin 3 (three) times daily before meals. 60 mL 3   insulin glargine (LANTUS SOLOSTAR) 100 UNIT/ML Solostar Pen Inject 45 Units into the skin daily. 45 mL 3   Insulin Pen Needle (B-D ULTRAFINE III SHORT PEN) 31G X 8 MM MISC USE AS DIRECTED 100 each 6   Lancet Devices (ACCU-CHEK SOFTCLIX) lancets 1 each by Other route 3 (three) times daily. 1 each 0   methocarbamol (ROBAXIN) 500 MG tablet Take 1 tablet (500 mg total) by mouth daily. (Patient taking differently: Take 500 mg by mouth at bedtime.) 90 tablet 1   metolazone (ZAROXOLYN) 5 MG tablet Take 1 tablet (5 mg total) by mouth once a week.     metoprolol succinate (TOPROL-XL) 50 MG 24 hr tablet Take 1 tablet (50 mg total) by mouth daily. 90 tablet 3   nystatin (MYCOSTATIN/NYSTOP) powder Apply 1 Application topically 3 (three) times daily. 60 g 1   potassium chloride SA (KLOR-CON M) 20 MEQ tablet Take 2 tablets (40 mEq total) by mouth daily. 60 tablet 3   sacubitril-valsartan (ENTRESTO) 49-51 MG Take 1 tablet by mouth 2 (two) times daily. 60 tablet 3   sertraline (ZOLOFT) 50 MG tablet Take 1.5 tablets (75 mg total) by mouth in the morning. 45 tablet 2   tamsulosin (FLOMAX) 0.4 MG CAPS capsule Take 2 capsules by mouth once daily 180 capsule 0   glucose blood (ACCU-CHEK GUIDE) test strip Use as instructed 100 each 12   torsemide (DEMADEX) 20 MG tablet Take 4 tablets (80 mg total) by  mouth daily. 120 tablet 3   No current facility-administered medications for this encounter.    PHYSICAL EXAM: Vitals:   08/24/23 1525  BP: 116/64  Pulse: 78  SpO2: 93%   GENERAL: NAD Lungs- CTA CARDIAC:  JVP: 6 cm          Normal rate with regular rhythm. no murmur.  Pulses 2+. No edema.  ABDOMEN: Soft, non-tender, non-distended.  EXTREMITIES: Warm and well perfused.  NEUROLOGIC: No obvious FND'    DATA REVIEW  ECG:NSR  ECHO: 04/26/23 LVEF 25%-30%, normal RV function.  08/22/2022: LVEF 35%, normal RV function 05/06/2022: LVEF 30-35%, normal RV function.  02/16/19: LVEF 45-50%, normal RV function.   CATH: 05/12/22: HEMODYNAMICS: RA:                  1 mmHg (mean) RV:  22/1-3 mmHg PA:                  23/6 mmHg (12 mean) PCWP:            4 mmHg (mean)                                      Estimated Fick CO/CI   7 L/min, 2.9 L/min/m2                                                 TPG                 8  mmHg                                              PVR                 ~1 Wood Units  PAPi                >5       IMPRESSION: Low pre and post capillary filling pressures.  Normal cardiac output/cardiac index.  Normal PVR & PA mean No obstructive CAD, left dominant w/ large Lcx supplying multiple large marginals.    ASSESSMENT & PLAN:  Heart failure with reduced EF, Stage D, NYHA IIB-III Etiology of [id number] as demonstrated by coronary angiography; no family history. If he continues to have recurrent VT will consider evaluation for sarcoidosis. Device interrogation w/o arrhythmias NYHA class / AHA Stage: III;  Volume status & Diuretics:  mildly hypervolemic on torsemide 80mg  daily; cannot take it at night due to nocturia. Will increase to 100mg  x 4 days. He has fairly advanced HF; high risk for re-admission.  Vasodilators: continue Entresto 49/51mg  BID; SBP at goal. Beta-Blocker: Continue Toprol to 50mg  MRA: Continue spironolactone 12.5 mg  daily.   Cardiometabolic:continue farxiga to 10mg  daily Devices therapies & Valvulopathies:dcICD; repeat TTE ordered.  Advanced therapies: CPX in 2017 peak VO2 of 15 AND ve/vco2 of 35 but with submaximal effort; consistent with moderate HF limitations. Advanced therapies at this time limited by CKD and social barriers.   2. VT - Reviewed device interrogation today; no VT/VF. Increasing optivol. See above, will increase torsemide.   3. T2DM - continue farxiga, insulin.  -  - Followed by his PCP  4. CKD3B - Continue farxiga and Entresto - see above. Repeat BMP/BNP today.   5. Panic attacks -Doing better now but still reports that he has moments when he feels like he is going to faint. Reports that his blood pressure is 110s systolic.   6. Gluteal cellulitis  -Following with general surgery  7. COPD - Currently on trelegy  - following with pulmonology on 09/14/23 - stable today; reviewed inhalers with him.   8. Obesity - Body mass index is 38.95 kg/m. - discussed importance of weight loss / physical activity. Will refer to cardiac rehab.   I spent 35 minutes caring for this patient today including face to face time, ordering and reviewing labs, discussing inhalers, discussing importance of physical activity/weight loss, seeing the patient, documenting in the record, and arranging  follow ups.   [place] Advanced Heart Failure Mechanical Circulatory Support

## 2023-08-24 ENCOUNTER — Encounter (HOSPITAL_COMMUNITY): Payer: Self-pay | Admitting: Cardiology

## 2023-08-24 ENCOUNTER — Ambulatory Visit (HOSPITAL_COMMUNITY)
Admission: RE | Admit: 2023-08-24 | Discharge: 2023-08-24 | Disposition: A | Discharge status: Home / Self Care | Payer: Medicaid Other | Source: Ambulatory Visit | Attending: Cardiology | Admitting: Cardiology

## 2023-08-24 ENCOUNTER — Encounter: Payer: Medicaid Other | Attending: Cardiology

## 2023-08-24 VITALS — BP 116/64 | HR 78 | Ht 73.0 in | Wt 295.2 lb

## 2023-08-24 DIAGNOSIS — E1122 Type 2 diabetes mellitus with diabetic chronic kidney disease: Secondary | ICD-10-CM

## 2023-08-24 DIAGNOSIS — L03317 Cellulitis of buttock: Secondary | ICD-10-CM | POA: Insufficient documentation

## 2023-08-24 DIAGNOSIS — J449 Chronic obstructive pulmonary disease, unspecified: Secondary | ICD-10-CM | POA: Diagnosis not present

## 2023-08-24 DIAGNOSIS — Z79899 Other long term (current) drug therapy: Secondary | ICD-10-CM | POA: Diagnosis not present

## 2023-08-24 DIAGNOSIS — I5022 Chronic systolic (congestive) heart failure: Secondary | ICD-10-CM | POA: Insufficient documentation

## 2023-08-24 DIAGNOSIS — N183 Chronic kidney disease, stage 3 unspecified: Secondary | ICD-10-CM | POA: Diagnosis not present

## 2023-08-24 DIAGNOSIS — Z95810 Presence of automatic (implantable) cardiac defibrillator: Secondary | ICD-10-CM

## 2023-08-24 DIAGNOSIS — E669 Obesity, unspecified: Secondary | ICD-10-CM | POA: Diagnosis not present

## 2023-08-24 DIAGNOSIS — Z7901 Long term (current) use of anticoagulants: Secondary | ICD-10-CM | POA: Insufficient documentation

## 2023-08-24 DIAGNOSIS — B9689 Other specified bacterial agents as the cause of diseases classified elsewhere: Secondary | ICD-10-CM | POA: Insufficient documentation

## 2023-08-24 DIAGNOSIS — Z794 Long term (current) use of insulin: Secondary | ICD-10-CM | POA: Diagnosis not present

## 2023-08-24 DIAGNOSIS — F410 Panic disorder [episodic paroxysmal anxiety] without agoraphobia: Secondary | ICD-10-CM | POA: Diagnosis not present

## 2023-08-24 DIAGNOSIS — Z6838 Body mass index (BMI) 38.0-38.9, adult: Secondary | ICD-10-CM | POA: Insufficient documentation

## 2023-08-24 DIAGNOSIS — L0231 Cutaneous abscess of buttock: Secondary | ICD-10-CM | POA: Diagnosis not present

## 2023-08-24 DIAGNOSIS — N1832 Chronic kidney disease, stage 3b: Secondary | ICD-10-CM | POA: Diagnosis not present

## 2023-08-24 MED ORDER — TORSEMIDE 20 MG PO TABS: 80.0000 mg | ORAL_TABLET | Freq: Every day | ORAL | 3 refills | Status: DC
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
## 2023-08-24 NOTE — Patient Instructions (Addendum)
 Medication Changes:  INCREASE TORSEMIDE TO 100MG  DAILY FOR 3 DAYS AND THEN RETURN TO 80mg  ONCE DAILY   Echocardiogram as scheduled   YOU HAVE BEEN REFERRED TO [facility] THEY WILL REACH OUT TO YOU OR CALL TO ARRANGE THIS. PLEASE CALL us WITH ANY CONCERNS   Follow-Up in: 4 months PLEASE CALL OUR OFFICE AROUND MAY TO GET SCHEDULED FOR YOUR APPOINTMENT. PHONE NUMBER IS [phone] OPTION 2   At the [facility], you and your health needs are our priority. We have a designated team specialized in the treatment of Heart Failure. This Care Team includes your primary Heart Failure Specialized Cardiologist (physician), Advanced Practice Providers (APPs- Physician Assistants and Nurse Practitioners), and Pharmacist who all work together to provide you with the care you need, when you need it.   You may see any of the following providers on your designated Care Team at your next follow up:  Dr. Arvilla Meres Dr. Marca Ancona Dr. Dorthula Nettles Dr. Theresia Bough Tonye Becket, NP Robbie Lis, [place] [facility] [place], [place] Brynda Peon, NP [place] Lee, NP Karle Plumber, PharmD   Please be sure to bring in all your medications bottles to every appointment.   Need to Contact us:  If you have any questions or concerns before your next appointment please send [place] a message through Pacific or call our office at [phone].    TO LEAVE A MESSAGE FOR THE NURSE SELECT OPTION 2, PLEASE LEAVE A MESSAGE INCLUDING: YOUR NAME DATE OF BIRTH CALL BACK NUMBER REASON FOR CALL**this is important as we prioritize the call backs  YOU WILL RECEIVE A CALL BACK THE SAME DAY AS LONG AS YOU CALL BEFORE 4:00 PM

## 2023-08-25 ENCOUNTER — Telehealth: Payer: Self-pay | Admitting: Primary Care

## 2023-08-25 NOTE — Telephone Encounter (Signed)
 PT sched for a sleep study but has been suffering with insomnia. He called the Titration lab and they said for Dr. to Prescribe a sleep aide he can take the night of the study.  Pharm is [facility] on [address]   His # is [phone] Please call pT to advise action taken.

## 2023-08-26 ENCOUNTER — Ambulatory Visit (HOSPITAL_BASED_OUTPATIENT_CLINIC_OR_DEPARTMENT_OTHER): Payer: Medicaid Other | Attending: Internal Medicine | Admitting: Pulmonary Disease

## 2023-08-26 DIAGNOSIS — G4733 Obstructive sleep apnea (adult) (pediatric): Secondary | ICD-10-CM | POA: Diagnosis present

## 2023-08-26 NOTE — Progress Notes (Signed)
 EPIC Encounter for ICM Monitoring  Patient Name: [name] is a 58 y.o. male Date: 08/26/2023 Primary Care Physican: Marcine Matar, MD Primary Cardiologist: Crenshaw/[name] Electrophysiologist: Lalla Brothers 01/07/2023 Weight: 280 lbs 02/13/2023 Weight: 297 lbs 03/25/2023 Weight: 297 lbs    06/12/2023 Weight: 289 lbs   07/16/2023 Weight: Not weighing   07/28/2023 Weight: 284 lbs  08/24/2023 Office Weight: 295 lbs                   Transmission results reviewed.    Corvue thoracic impedance suggesting normal fluid levels with the exception of possible fluid accumulation from 2/17-2/22.   Prescribed dosage:  Torsemide 20 mg take 4 tablet(s) (80 mg total) by mouth twice a day Potassium 20 mEq take 2 tablets (40 mEq total) by mouth every day.    Metolazone 5 mg take 1 tablet by mouth once a week (Nephrologist said he could take it up to twice a week).     Labs: 07/30/2023 Creatinine 2.74, BUN 36, Potassium 4.6, Sodium 139, GFR 26 05/21/2023 Creatinine 2.56, BUN 27, Potassium 4.1, Sodium 138, GFR 28  05/13/2023 Creatinine 2.75, BUN 23, Potassium 3.9, Sodium 138, GFR 26  A complete set of results can be found in Results Review.   Recommendations:  Recommendations given at HF clinic 3/17 office visit by Dr Gasper Lloyd.   Follow-up plan: ICM clinic phone appointment on 09/28/2023.   91 day device clinic remote transmission 11/18/2023.     EP/Cardiology Office Visits:  Recall 11/02/2023 with Dr Lalla Brothers or Yevette Edwards, PA.  Recall 12/22/2023 with [name].   Copy of ICM check sent to Dr Lalla Brothers.  3 month ICM trend: 08/24/2023.    12-14 Month ICM trend:     Karie Soda, RN 08/26/2023 10:38 AM

## 2023-08-26 NOTE — Telephone Encounter (Signed)
 Patent's sleep study is tonight and he needs medication to help him sleep since he has insomnia. The medication that was given to him,trazadon, does not work for him. He will need something else prescribed. Please give him a call before his sleep study to assist with medication. [phone]  [facility] on [address]

## 2023-08-27 ENCOUNTER — Telehealth (HOSPITAL_COMMUNITY): Payer: Medicaid Other | Admitting: Psychiatry

## 2023-08-27 ENCOUNTER — Encounter (HOSPITAL_COMMUNITY): Payer: Self-pay | Admitting: Psychiatry

## 2023-08-27 DIAGNOSIS — F4310 Post-traumatic stress disorder, unspecified: Secondary | ICD-10-CM | POA: Diagnosis not present

## 2023-08-27 DIAGNOSIS — F411 Generalized anxiety disorder: Secondary | ICD-10-CM

## 2023-08-27 DIAGNOSIS — F410 Panic disorder [episodic paroxysmal anxiety] without agoraphobia: Secondary | ICD-10-CM | POA: Diagnosis not present

## 2023-08-27 MED ORDER — SERTRALINE HCL 50 MG PO TABS: 75.0000 mg | ORAL_TABLET | Freq: Every morning | ORAL | 2 refills | Status: DC

## 2023-08-27 MED ORDER — CLONAZEPAM 0.5 MG PO TABS
0.5000 mg | ORAL_TABLET | Freq: Two times a day (BID) | ORAL | 1 refills | Status: DC | PRN
Start: 2023-08-27 — End: 2023-10-01

## 2023-08-27 NOTE — Telephone Encounter (Signed)
 Called and spoke with patient, he had study on 08/26/23, states he only slept 2 hours.  He was wanting something new for sleep as the Trazodone was not working.  Advised we would contact him with the test results when they are available.  Nothing further needed.

## 2023-09-03 ENCOUNTER — Ambulatory Visit (HOSPITAL_COMMUNITY)
Admission: RE | Admit: 2023-09-03 | Discharge: 2023-09-03 | Disposition: A | Discharge status: Home / Self Care | Source: Ambulatory Visit | Attending: Cardiology | Admitting: Cardiology

## 2023-09-03 DIAGNOSIS — Z95810 Presence of automatic (implantable) cardiac defibrillator: Secondary | ICD-10-CM | POA: Diagnosis not present

## 2023-09-03 DIAGNOSIS — Z006 Encounter for examination for normal comparison and control in clinical research program: Secondary | ICD-10-CM

## 2023-09-03 DIAGNOSIS — R0600 Dyspnea, unspecified: Secondary | ICD-10-CM | POA: Insufficient documentation

## 2023-09-03 DIAGNOSIS — R55 Syncope and collapse: Secondary | ICD-10-CM | POA: Diagnosis not present

## 2023-09-03 DIAGNOSIS — G4733 Obstructive sleep apnea (adult) (pediatric): Secondary | ICD-10-CM | POA: Diagnosis not present

## 2023-09-03 DIAGNOSIS — R000 Tachycardia, unspecified: Secondary | ICD-10-CM | POA: Diagnosis not present

## 2023-09-03 DIAGNOSIS — E119 Type 2 diabetes mellitus without complications: Secondary | ICD-10-CM | POA: Diagnosis not present

## 2023-09-03 DIAGNOSIS — I252 Old myocardial infarction: Secondary | ICD-10-CM | POA: Diagnosis not present

## 2023-09-03 DIAGNOSIS — N189 Chronic kidney disease, unspecified: Secondary | ICD-10-CM | POA: Diagnosis not present

## 2023-09-03 DIAGNOSIS — I130 Hypertensive heart and chronic kidney disease with heart failure and stage 1 through stage 4 chronic kidney disease, or unspecified chronic kidney disease: Secondary | ICD-10-CM | POA: Diagnosis not present

## 2023-09-03 DIAGNOSIS — E785 Hyperlipidemia, unspecified: Secondary | ICD-10-CM | POA: Insufficient documentation

## 2023-09-03 DIAGNOSIS — Z87891 Personal history of nicotine dependence: Secondary | ICD-10-CM | POA: Insufficient documentation

## 2023-09-03 DIAGNOSIS — J449 Chronic obstructive pulmonary disease, unspecified: Secondary | ICD-10-CM | POA: Diagnosis not present

## 2023-09-03 DIAGNOSIS — I5022 Chronic systolic (congestive) heart failure: Secondary | ICD-10-CM | POA: Insufficient documentation

## 2023-09-03 DIAGNOSIS — R079 Chest pain, unspecified: Secondary | ICD-10-CM | POA: Insufficient documentation

## 2023-09-03 LAB — ECHOCARDIOGRAM COMPLETE
AR max vel: 2.46 cm2
AV Area VTI: 3.03 cm2
AV Area mean vel: 2.53 cm2
AV Mean grad: 3 mmHg
AV Peak grad: 5.7 mmHg
Ao pk vel: 1.19 m/s
Area-P 1/2: 3.66 cm2
Calc EF: 38.3 %
MV VTI: 3.35 cm2
S' Lateral: 4.3 cm
Single Plane A2C EF: 41 %
Single Plane A4C EF: 28.2 %

## 2023-09-03 NOTE — Procedures (Signed)
 Patient Name:  [name], [name] Date:  08/26/2023 Referring Physician:  Durel Salts, MD  Indications for Polysomnography The patient is a 58 year-old Male who is 6' and weighs 284.0 lbs. His BMI equals 38.9.  A full night titration treatment study was performed. HST showed severe OSA with Ahi 71/h & low sat of 79%  Polysomnogram Data A full night polysomnogram recorded the standard physiologic parameters including EEG, EOG, EMG, EKG, nasal and oral airflow.  Respiratory parameters of chest and abdominal movements were recorded with Respiratory Inductance Plethysmography belts.  Oxygen saturation was recorded by pulse oximetry.   Sleep Architecture The total recording time of the polysomnogram was 373.5 minutes.  The total sleep time was 172.5 minutes.  The patient spent 5.5% of total sleep time in Stage N1, 94.5% in Stage N2, 0.0% in Stages N3, and 0.0% in REM.  Sleep latency was 13.8 minutes.  REM latency was - minutes.  Sleep Efficiency was 46.2%.  Wake after Sleep Onset time was 187.0 minutes.  Titration Summary The patient was titrated at pressures ranging from 5* cm/H20up to 13* cm/H20.The last pressure used in the study was 13* cm/H20   Respiratory Events The polysomnogram revealed a presence of 5 obstructive, - central, and - mixed apneas resulting in an Apnea index of 1.7 events per hour.  There were 44 hypopneas (>=3% desaturation and/or arousal) resulting in an Apnea\Hypopnea Index (AHI >=3% desaturation and/or arousal) of 17.0 events per hour.  There were 30 hypopneas (>=4% desaturation) resulting in an Apnea\Hypopnea Index (AHI >=4% desaturation) of 12.2 events per hour.  There were - Respiratory Effort Related Arousals resulting in a RERA index of - events per hour. The Respiratory Disturbance Index is 17.0 events per hour.  The snore index was 339.5 events per hour.  Mean oxygen saturation was 93.1%.  The lowest oxygen saturation during sleep was 85.0%.  Time spent <=88% oxygen  saturation was 2.5 minutes (0.7%).  Limb Activity There were - limb movements recorded.  Of this total, - were classified as PLMs.  Of the PLMs, - were associated with arousals.  The Limb Movement index was - per hour while the PLM index was - per hour.  Cardiac Summary The average pulse rate was 73.2 bpm.  The minimum pulse rate was 65.0 bpm while the maximum pulse rate was 196.0 bpm.  Cardiac rhythm was normal/abnormal.  Diagnosis: Severe OSA  Recommendations: CPAP 11-13 cm provides adequate control of events There were long periods of wake during the study suggesting insomnia vs first night effect.   This study was personally reviewed and electronically signed by: Cyril Mourning, MD Accredited Board Certified in Sleep Medicine

## 2023-09-03 NOTE — Progress Notes (Signed)
*  PRELIMINARY RESULTS* Echocardiogram 2D Echocardiogram has been performed.  [name] 09/03/2023, 3:58 PM

## 2023-09-03 NOTE — Research (Signed)
 SITE: [id number]     Subject # [id number]   Subprotocol: A  Inclusion Criteria  Patients who meet all of the following criteria are eligible for enrollment as study participants:  Yes No  Age > 59 years old X   Eligible to wear Holter Study X    Exclusion Criteria  Patients who meet any of these criteria are not eligible for enrollment as study participants: Yes No  1. Receiving any mechanical (respiratory or circulatory) or renal support therapy at Screening or during Visit #1.  X  2.  Any other conditions that in the opinion of the investigators are likely to prevent compliance with the study protocol or pose a safety concern if the subject participates in the study.  X  3. Poor tolerance, namely susceptible to severe skin allergies from ECG adhesive patch application.  X   Protocol: REV H                                     Residential Zip code [address] (First 3 digits ONLY)                                             [employer] Informed Consent   Subject Name: [name]  Subject met inclusion and exclusion criteria.  The informed consent form, study requirements and expectations were reviewed with the subject. Subject had opportunity to read consent and questions and concerns were addressed prior to the signing of the consent form.  The subject verbalized understanding of the trial requirements.  The subject agreed to participate in the [employer] EF Act trial and signed the informed consent at 15:57 on 03-Sep-2023.  The informed consent was obtained prior to performance of any protocol-specific procedures for the subject.  A copy of the signed informed consent was given to the subject and a copy was placed in the subject's medical record.   Dyanne Iha          Current Outpatient Medications:    Accu-Chek Softclix Lancets lancets, Use as instructed, Disp: 100 each, Rfl: 12   acetaminophen (TYLENOL) 500 MG tablet, Take 1,000 mg by mouth every 8 (eight) hours as needed., Disp: , Rfl:     amiodarone (PACERONE) 200 MG tablet, Take 1 tablet (200 mg total) by mouth daily., Disp: 90 tablet, Rfl: 3   apixaban (ELIQUIS) 5 MG TABS tablet, Take 1 tablet (5 mg total) by mouth 2 (two) times daily., Disp: 60 tablet, Rfl: 3   atorvastatin (LIPITOR) 10 MG tablet, Take 1 tablet by mouth once daily, Disp: 90 tablet, Rfl: 0   benzonatate (TESSALON) 200 MG capsule, Take 1 capsule (200 mg total) by mouth 2 (two) times daily as needed for cough., Disp: 30 capsule, Rfl: 1   Blood Glucose Monitoring Suppl (ACCU-CHEK GUIDE) w/Device KIT, UAD, Disp: 1 kit, Rfl: 0   Blood Glucose Monitoring Suppl DEVI, 1 each by Does not apply route in the morning, at noon, and at bedtime. May substitute to any manufacturer covered by patient's insurance., Disp: 1 each, Rfl: 0   Blood Pressure Monitoring DEVI, Dispense 1 bp monitor, check daily., Disp: 1 each, Rfl: 0   cetirizine (EQ ALLERGY RELIEF, CETIRIZINE,) 10 MG tablet, Take 1 tablet (10 mg total) by mouth daily. (Patient taking differently: Take 10  mg by mouth in the morning.), Disp: 90 tablet, Rfl: 1   cholecalciferol (VITAMIN D3) 25 MCG (1000 UNIT) tablet, Take 1,000 Units by mouth in the morning., Disp: , Rfl:    clonazePAM (KLONOPIN) 0.5 MG tablet, Take 1 tablet (0.5 mg total) by mouth 2 (two) times daily as needed for anxiety., Disp: 60 tablet, Rfl: 1   Clotrimazole 1 % OINT, Apply to the affected area twice a day, Disp: 56.7 g, Rfl: 0   Continuous Glucose Receiver (FREESTYLE LIBRE 2 READER) DEVI, USE AS DIRECTED, Disp: 1 each, Rfl: 0   Continuous Glucose Sensor (FREESTYLE LIBRE 2 SENSOR) MISC, Use to check blood sugar TID. Change sensor once every 14 days. E11.65, Disp: 2 each, Rfl: 11   EPINEPHrine 0.3 mg/0.3 mL IJ SOAJ injection, Inject 0.3 mg into the muscle as needed., Disp: 1 each, Rfl: 1   famotidine (PEPCID) 20 MG tablet, TAKE 1 TABLET BY MOUTH 1 TO 2 TIMES DAILY AS NEEDED, Disp: 180 tablet, Rfl: 0   fluticasone (FLONASE) 50 MCG/ACT nasal spray, Use 1-2  sprays in each nostril once a day as needed for nasal congestion., Disp: 16 g, Rfl: 5   Fluticasone-Umeclidin-Vilant (TRELEGY ELLIPTA) 100-62.5-25 MCG/ACT AEPB, Inhale 1 puff into the lungs daily., Disp: 1 each, Rfl: 11   gabapentin (NEURONTIN) 300 MG capsule, Take 1 capsule (300 mg total) by mouth at bedtime., Disp: 90 capsule, Rfl: 1   glucose blood (ACCU-CHEK GUIDE) test strip, Use as instructed, Disp: 100 each, Rfl: 12   glucose blood (FREESTYLE LITE) test strip, Use as instructed.  Please make sure this is compatible with [employer] [place] 2 reader., Disp: 100 each, Rfl: 12   insulin aspart (NOVOLOG FLEXPEN) 100 UNIT/ML FlexPen, Inject 30-38 Units into the skin 3 (three) times daily before meals., Disp: 60 mL, Rfl: 3   insulin glargine (LANTUS SOLOSTAR) 100 UNIT/ML Solostar Pen, Inject 45 Units into the skin daily., Disp: 45 mL, Rfl: 3   Insulin Pen Needle (B-D ULTRAFINE III SHORT PEN) 31G X 8 MM MISC, USE AS DIRECTED, Disp: 100 each, Rfl: 6   Lancet Devices (ACCU-CHEK SOFTCLIX) lancets, 1 each by Other route 3 (three) times daily., Disp: 1 each, Rfl: 0   methocarbamol (ROBAXIN) 500 MG tablet, Take 1 tablet (500 mg total) by mouth daily. (Patient taking differently: Take 500 mg by mouth at bedtime.), Disp: 90 tablet, Rfl: 1   metolazone (ZAROXOLYN) 5 MG tablet, Take 1 tablet (5 mg total) by mouth once a week., Disp: , Rfl:    metoprolol succinate (TOPROL-XL) 50 MG 24 hr tablet, Take 1 tablet (50 mg total) by mouth daily., Disp: 90 tablet, Rfl: 3   nystatin (MYCOSTATIN/NYSTOP) powder, Apply 1 Application topically 3 (three) times daily., Disp: 60 g, Rfl: 1   potassium chloride SA (KLOR-CON M) 20 MEQ tablet, Take 2 tablets (40 mEq total) by mouth daily., Disp: 60 tablet, Rfl: 3   sacubitril-valsartan (ENTRESTO) 49-51 MG, Take 1 tablet by mouth 2 (two) times daily., Disp: 60 tablet, Rfl: 3   sertraline (ZOLOFT) 50 MG tablet, Take 1.5 tablets (75 mg total) by mouth in the morning., Disp: 45 tablet, Rfl: 2    tamsulosin (FLOMAX) 0.4 MG CAPS capsule, Take 2 capsules by mouth once daily, Disp: 180 capsule, Rfl: 0   torsemide (DEMADEX) 20 MG tablet, Take 4 tablets (80 mg total) by mouth daily., Disp: 120 tablet, Rfl: 3

## 2023-09-07 ENCOUNTER — Other Ambulatory Visit: Payer: Self-pay

## 2023-09-07 ENCOUNTER — Emergency Department (HOSPITAL_COMMUNITY)

## 2023-09-07 ENCOUNTER — Emergency Department (HOSPITAL_COMMUNITY)
Admission: EM | Admit: 2023-09-07 | Discharge: 2023-09-08 | Disposition: A | Discharge status: Home / Self Care | Attending: Emergency Medicine | Admitting: Emergency Medicine

## 2023-09-07 ENCOUNTER — Ambulatory Visit
Admission: RE | Admit: 2023-09-07 | Discharge: 2023-09-07 | Disposition: A | Discharge status: Home / Self Care | Source: Ambulatory Visit

## 2023-09-07 ENCOUNTER — Encounter (HOSPITAL_COMMUNITY): Payer: Self-pay

## 2023-09-07 VITALS — BP 102/84 | HR 110 | Temp 98.6°F | Resp 22 | Ht 73.0 in | Wt 284.0 lb

## 2023-09-07 DIAGNOSIS — I472 Ventricular tachycardia, unspecified: Secondary | ICD-10-CM | POA: Insufficient documentation

## 2023-09-07 DIAGNOSIS — B974 Respiratory syncytial virus as the cause of diseases classified elsewhere: Secondary | ICD-10-CM | POA: Diagnosis not present

## 2023-09-07 DIAGNOSIS — R0789 Other chest pain: Secondary | ICD-10-CM | POA: Diagnosis not present

## 2023-09-07 DIAGNOSIS — I5022 Chronic systolic (congestive) heart failure: Secondary | ICD-10-CM | POA: Insufficient documentation

## 2023-09-07 DIAGNOSIS — R739 Hyperglycemia, unspecified: Secondary | ICD-10-CM | POA: Diagnosis not present

## 2023-09-07 DIAGNOSIS — R079 Chest pain, unspecified: Secondary | ICD-10-CM | POA: Insufficient documentation

## 2023-09-07 DIAGNOSIS — R000 Tachycardia, unspecified: Secondary | ICD-10-CM

## 2023-09-07 DIAGNOSIS — I10 Essential (primary) hypertension: Secondary | ICD-10-CM | POA: Diagnosis not present

## 2023-09-07 DIAGNOSIS — N289 Disorder of kidney and ureter, unspecified: Secondary | ICD-10-CM | POA: Insufficient documentation

## 2023-09-07 DIAGNOSIS — R0602 Shortness of breath: Secondary | ICD-10-CM

## 2023-09-07 DIAGNOSIS — R42 Dizziness and giddiness: Secondary | ICD-10-CM | POA: Diagnosis not present

## 2023-09-07 DIAGNOSIS — Z7901 Long term (current) use of anticoagulants: Secondary | ICD-10-CM | POA: Diagnosis not present

## 2023-09-07 DIAGNOSIS — E119 Type 2 diabetes mellitus without complications: Secondary | ICD-10-CM | POA: Diagnosis not present

## 2023-09-07 DIAGNOSIS — R531 Weakness: Secondary | ICD-10-CM | POA: Diagnosis not present

## 2023-09-07 DIAGNOSIS — B338 Other specified viral diseases: Secondary | ICD-10-CM

## 2023-09-07 DIAGNOSIS — I959 Hypotension, unspecified: Secondary | ICD-10-CM

## 2023-09-07 DIAGNOSIS — I110 Hypertensive heart disease with heart failure: Secondary | ICD-10-CM | POA: Insufficient documentation

## 2023-09-07 DIAGNOSIS — J9811 Atelectasis: Secondary | ICD-10-CM | POA: Diagnosis not present

## 2023-09-07 DIAGNOSIS — R059 Cough, unspecified: Secondary | ICD-10-CM | POA: Diagnosis not present

## 2023-09-07 LAB — CBC
HCT: 40.4 % (ref 39.0–52.0)
Hemoglobin: 13 g/dL (ref 13.0–17.0)
MCH: 30.2 pg (ref 26.0–34.0)
MCHC: 32.2 g/dL (ref 30.0–36.0)
MCV: 93.7 fL (ref 80.0–100.0)
Platelets: 353 10*3/uL (ref 150–400)
RBC: 4.31 MIL/uL (ref 4.22–5.81)
RDW: 13.8 % (ref 11.5–15.5)
WBC: 5.7 10*3/uL (ref 4.0–10.5)
nRBC: 0.7 % — ABNORMAL HIGH (ref 0.0–0.2)

## 2023-09-07 LAB — RESP PANEL BY RT-PCR (RSV, FLU A&B, COVID)  RVPGX2
Influenza A by PCR: NEGATIVE
Influenza B by PCR: NEGATIVE
Resp Syncytial Virus by PCR: POSITIVE — AB
SARS Coronavirus 2 by RT PCR: NEGATIVE

## 2023-09-07 LAB — BASIC METABOLIC PANEL WITH GFR
Anion gap: 12 (ref 5–15)
BUN: 23 mg/dL — ABNORMAL HIGH (ref 6–20)
CO2: 23 mmol/L (ref 22–32)
Calcium: 9.9 mg/dL (ref 8.9–10.3)
Chloride: 104 mmol/L (ref 98–111)
Creatinine, Ser: 2.71 mg/dL — ABNORMAL HIGH (ref 0.61–1.24)
GFR, Estimated: 26 mL/min — ABNORMAL LOW (ref >60)
Glucose, Bld: 175 mg/dL — ABNORMAL HIGH (ref 70–99)
Potassium: 4.4 mmol/L (ref 3.5–5.1)
Sodium: 139 mmol/L (ref 135–145)

## 2023-09-07 LAB — TROPONIN I (HIGH SENSITIVITY)
Troponin I (High Sensitivity): 10 ng/L (ref <18)
Troponin I (High Sensitivity): 9 ng/L (ref <18)

## 2023-09-07 LAB — POCT FASTING CBG KUC MANUAL ENTRY: POCT Glucose (KUC): 214 mg/dL — AB (ref 70–99)

## 2023-09-07 LAB — POCT INFLUENZA A/B
Influenza A, POC: NEGATIVE
Influenza B, POC: NEGATIVE

## 2023-09-07 NOTE — ED Provider Triage Note (Signed)
 Emergency Medicine Provider Triage Evaluation Note  [name] , a 58 y.o. male  was evaluated in triage.  Pt complains of URI Sxs and CP. Has still been taking HTN med and diuretic  Review of Systems  Positive: Cough, congestion, CP, SHOB, fatigue, decreased appetite Negative: Body aches, fever, chills, N/V/D  Physical Exam  BP (!) 99/56 (BP Location: Right Arm)   Pulse 78   Temp 97.7 F (36.5 C) (Oral)   Resp 17   Ht 6\' 1"  (1.854 m)   Wt 128.8 kg   SpO2 95%   BMI 37.46 kg/m  Gen:   Awake, no distress   Resp:  Normal effort  MSK:   Moves extremities without difficulty  Other:  Productive cough  Medical Decision Making  Medically screening exam initiated at 5:50 PM.  Appropriate orders placed.  [name] was informed that the remainder of the evaluation will be completed by another provider, this initial triage assessment does not replace that evaluation, and the importance of remaining in the ED until their evaluation is complete.  Labs and imaging ordered   Dolphus Jenny, [place] 09/07/23 [id number]

## 2023-09-07 NOTE — ED Triage Notes (Signed)
 Patient BIB EMS c/o congested cough and chest pain for 3 days. Nitroglycerin and aspirin given en route.   95% RA 80 HR 112 palpated BP 213 CBG

## 2023-09-07 NOTE — ED Notes (Signed)
 Unsuccessful lab draw x2.  Will ask lab to attempt.

## 2023-09-07 NOTE — ED Triage Notes (Signed)
 Coughing up yellow mucus, chest congestion and headache. - Entered by patient  Pt has been previously diagnosed with COPD and is experiencing SOB x 2 days. Pt denies dizziness and vomiting.

## 2023-09-07 NOTE — ED Provider Notes (Signed)
 [place] EMERGENCY DEPARTMENT AT [facility] Provider Note   CSN: [id number] Arrival date & time: 09/07/23  1717     History  Chief Complaint  Patient presents with   Chest Pain   Cough    [name] is a 58 y.o. male.  The history is provided by the patient.  Chest Pain Associated symptoms: cough   Cough Associated symptoms: chest pain   He has history of hypertension, diabetes, nonischemic cardiomyopathy with chronic systolic heart failure, automatic implanted cardioverter/defibrillator and comes in with cough and shortness of breath for the last 3 days.  He has not run any fever and has not had any chills or sweats.  He denies arthralgias or myalgias but has been having intermittent left-sided chest pain.  He describes a dull pain which is present for about 1 minute before resolving.  Pain is not affected by exertion, movement, breathing.  He has been slightly more short of breath than baseline.  He denies any sick contacts.  He is a non-smoker.   Home Medications Prior to Admission medications   Medication Sig Start Date End Date Taking? Authorizing Provider  Accu-Chek Softclix Lancets lancets Use as instructed 02/01/22   Lonell Grandchild, MD  acetaminophen (TYLENOL) 500 MG tablet Take 1,000 mg by mouth every 8 (eight) hours as needed.    [provider]  amiodarone (PACERONE) 200 MG tablet Take 1 tablet (200 mg total) by mouth daily. 08/15/22   Sheilah Pigeon, PA-C  apixaban (ELIQUIS) 5 MG TABS tablet Take 1 tablet (5 mg total) by mouth 2 (two) times daily. 06/30/23   Rachel Moulds, MD  atorvastatin (LIPITOR) 10 MG tablet Take 1 tablet by mouth once daily 08/19/23   Marcine Matar, MD  benzonatate (TESSALON) 200 MG capsule Take 1 capsule (200 mg total) by mouth 2 (two) times daily as needed for cough. 08/07/23   Claiborne Rigg, NP  Blood Glucose Monitoring Suppl (ACCU-CHEK GUIDE) w/Device KIT UAD 02/01/22   Lonell Grandchild, MD  Blood Glucose  Monitoring Suppl DEVI 1 each by Does not apply route in the morning, at noon, and at bedtime. May substitute to any manufacturer covered by patient's insurance. 12/23/22   Carlus Pavlov, MD  Blood Pressure Monitoring DEVI Dispense 1 bp monitor, check daily. 08/07/23   Claiborne Rigg, NP  cetirizine (EQ ALLERGY RELIEF, CETIRIZINE,) 10 MG tablet Take 1 tablet (10 mg total) by mouth daily. Patient taking differently: Take 10 mg by mouth in the morning. 10/30/22   Ambs, Norvel Richards, FNP  cholecalciferol (VITAMIN D3) 25 MCG (1000 UNIT) tablet Take 1,000 Units by mouth in the morning.    [provider]  clonazePAM (KLONOPIN) 0.5 MG tablet Take 1 tablet (0.5 mg total) by mouth 2 (two) times daily as needed for anxiety. 08/27/23 08/26/24  Stasia Cavalier, MD  Clotrimazole 1 % OINT Apply to the affected area twice a day 05/11/23   Marcine Matar, MD  Continuous Glucose Receiver (FREESTYLE LIBRE 2 READER) DEVI USE AS DIRECTED 03/06/23   Marcine Matar, MD  Continuous Glucose Sensor (FREESTYLE LIBRE 2 SENSOR) MISC Use to check blood sugar TID. Change sensor once every 14 days. E11.65 03/06/23   Hoy Register, MD  EPINEPHrine 0.3 mg/0.3 mL IJ SOAJ injection Inject 0.3 mg into the muscle as needed. 01/30/22   Alfonse Spruce, MD  famotidine (PEPCID) 20 MG tablet TAKE 1 TABLET BY MOUTH 1 TO 2 TIMES DAILY AS NEEDED  07/14/23   Hetty Blend, FNP  fluticasone (FLONASE) 50 MCG/ACT nasal spray Use 1-2 sprays in each nostril once a day as needed for nasal congestion. 08/07/23   Claiborne Rigg, NP  Fluticasone-Umeclidin-Vilant (TRELEGY ELLIPTA) 100-62.5-25 MCG/ACT AEPB Inhale 1 puff into the lungs daily. 06/29/23   Charlott Holler, MD  gabapentin (NEURONTIN) 300 MG capsule Take 1 capsule (300 mg total) by mouth at bedtime. 05/25/23   Marcine Matar, MD  glucose blood (ACCU-CHEK GUIDE) test strip Use as instructed 02/01/22   Lonell Grandchild, MD  glucose blood (FREESTYLE LITE) test strip Use as  instructed.  Please make sure this is compatible with [facility] 2 reader. 07/14/22   Marcine Matar, MD  insulin aspart (NOVOLOG FLEXPEN) 100 UNIT/ML FlexPen Inject 30-38 Units into the skin 3 (three) times daily before meals. 08/13/23   Carlus Pavlov, MD  insulin glargine (LANTUS SOLOSTAR) 100 UNIT/ML Solostar Pen Inject 45 Units into the skin daily. 08/13/23   Carlus Pavlov, MD  Insulin Pen Needle (B-D ULTRAFINE III SHORT PEN) 31G X 8 MM MISC USE AS DIRECTED 05/25/23   Marcine Matar, MD  Lancet Devices [facility]) lancets 1 each by Other route 3 (three) times daily. 10/20/14   Funches, Gerilyn Nestle, MD  methocarbamol (ROBAXIN) 500 MG tablet Take 1 tablet (500 mg total) by mouth daily. Patient taking differently: Take 500 mg by mouth at bedtime. 04/24/23   Marcine Matar, MD  metolazone (ZAROXOLYN) 5 MG tablet Take 1 tablet (5 mg total) by mouth once a week. 06/15/23 09/13/23  [name], Aditya, DO  metoprolol succinate (TOPROL-XL) 50 MG 24 hr tablet Take 1 tablet (50 mg total) by mouth daily. 08/22/22   [name], Aditya, DO  nystatin (MYCOSTATIN/NYSTOP) powder Apply 1 Application topically 3 (three) times daily. 08/08/23   Claiborne Rigg, NP  potassium chloride SA (KLOR-CON M) 20 MEQ tablet Take 2 tablets (40 mEq total) by mouth daily. 05/13/23   Milford, Anderson Malta, FNP  sacubitril-valsartan (ENTRESTO) 49-51 MG Take 1 tablet by mouth 2 (two) times daily. 05/21/23   [name], Aditya, DO  sertraline (ZOLOFT) 50 MG tablet Take 1.5 tablets (75 mg total) by mouth in the morning. 08/27/23 08/26/24  Stasia Cavalier, MD  SYMBICORT 160-4.5 MCG/ACT inhaler Inhale into the lungs. 08/28/23   [provider]  tamsulosin (FLOMAX) 0.4 MG CAPS capsule Take 2 capsules by mouth once daily 08/19/23   Marcine Matar, MD  torsemide (DEMADEX) 20 MG tablet Take 4 tablets (80 mg total) by mouth daily. 08/24/23   [name], Aditya, DO  traZODone (DESYREL) 50 MG tablet Take 50 mg by mouth at bedtime.  08/28/23   [provider]      Allergies    Bee venom and Trulicity [dulaglutide]    Review of Systems   Review of Systems  Respiratory:  Positive for cough.   Cardiovascular:  Positive for chest pain.  All other systems reviewed and are negative.   Physical Exam Updated Vital Signs BP 107/70 (BP Location: Right Arm)   Pulse 68   Temp 97.8 F (36.6 C) (Oral)   Resp (!) 23   Ht 6\' 1"  (1.854 m)   Wt 128.8 kg   SpO2 99%   BMI 37.46 kg/m  Physical Exam Vitals and nursing note reviewed.   58 year old male, resting comfortably and in no acute distress. Vital signs are normal. Oxygen saturation is 94%, which is normal. Head is normocephalic and atraumatic. PERRLA, EOMI. Oropharynx  is clear. Neck is nontender and supple without adenopathy or JVD. Lungs are clear without rales, wheezes, or rhonchi. Chest is nontender.  AICD present on the left. Heart has regular rate and rhythm without murmur.  There is no chest wall tenderness. Abdomen is soft, flat, nontender. Extremities have no cyanosis or edema, full range of motion is present. Skin is warm and dry without rash. Neurologic: Mental status is normal, moves all extremities equally.  ED Results / Procedures / Treatments   Labs (all labs ordered are listed, but only abnormal results are displayed) Labs Reviewed  RESP PANEL BY RT-PCR (RSV, FLU A&B, COVID)  RVPGX2 - Abnormal; Notable for the following components:      Result Value   Resp Syncytial Virus by PCR POSITIVE (*)    All other components within normal limits  BASIC METABOLIC PANEL WITH GFR - Abnormal; Notable for the following components:   Glucose, Bld 175 (*)    BUN 23 (*)    Creatinine, Ser 2.71 (*)    GFR, Estimated 26 (*)    All other components within normal limits  CBC - Abnormal; Notable for the following components:   nRBC 0.7 (*)    All other components within normal limits  TROPONIN I (HIGH SENSITIVITY)  TROPONIN I (HIGH SENSITIVITY)    Radiology DG Chest 2 View Result Date: 09/07/2023 CLINICAL DATA:  Chest pain and cough. EXAM: CHEST - 2 VIEW COMPARISON:  April 24, 2023 FINDINGS: There is stable dual lead AICD positioning. The heart size and mediastinal contours are within normal limits. Mild linear atelectasis is seen within the left lung base. No pleural effusion or pneumothorax is identified. The visualized skeletal structures are unremarkable. IMPRESSION: Mild left basilar linear atelectasis. Electronically Signed   By: Aram Candela M.D.   On: 09/07/2023 17:44    Procedures Procedures  Cardiac monitor shows normal sinus rhythm, per my interpretation.  Medications Ordered in ED Medications - No data to display  ED Course/ Medical Decision Making/ A&P             HEART Score: 2                    Medical Decision Making Amount and/or Complexity of Data Reviewed Labs: ordered. Radiology: ordered.   Cough, shortness of breath, chest pain.  Doubt ACS-suspect underlying infection such as pneumonia or viral infection such as RSV or COVID-19 or influenza.  Chest x-ray shows no evidence of pneumonia.  I independently viewed the images, and agree with the radiologist's interpretation.  I have reviewed his electrocardiogram and my interpretation is low voltage but not changed from baseline.  I reviewed his laboratory tests, and my interpretation is stable renal insufficiency, normal CBC, normal troponin x 2.  Heart score is 2, which puts him at low risk for major adverse cardiac events in the next 6 weeks.  Respiratory pathogen panel is positive for RSV, which is apparently what is causing his symptoms.  I have reviewed his past records, and note cardiac catheterization on 05/12/2022 showing no obstructive coronary artery disease.  Echocardiogram on 09/03/2023 showed decreased ejection fraction of 40-45% with global hypokinesis and grade 1 diastolic dysfunction.  Ejection fraction is improved compared with 04/26/2023.  I  am discharging him with instructions to use over-the-counter acetaminophen as needed for pain, use albuterol inhaler as needed for cough or dyspnea.  Return if symptoms are worsening.  Of note, patient has not been immunized against RSV or pneumococcus.  Given his underlying condition, I am recommending that he follow-up with his medical providers to consider immunization for RSV and pneumococcus as well as annual influenza vaccine and periodic COVID vaccination.  Final Clinical Impression(s) / ED Diagnoses Final diagnoses:  Infection due to respiratory syncytial virus (RSV), unspecified infection type  Nonspecific chest pain  Renal insufficiency  Chronic anticoagulation    Rx / DC Orders ED Discharge Orders     None         Dione Booze, MD 09/08/23 0010

## 2023-09-07 NOTE — ED Notes (Signed)
 EMS with pt

## 2023-09-07 NOTE — ED Provider Notes (Incomplete)
 [place] EMERGENCY DEPARTMENT AT [facility] Provider Note   CSN: [id number] Arrival date & time: 09/07/23  1717     History {Add pertinent medical, surgical, social history, OB history to HPI:1} Chief Complaint  Patient presents with  . Chest Pain  . Cough    [name] is a 58 y.o. male.  The history is provided by the patient.  Chest Pain Associated symptoms: cough   Cough Associated symptoms: chest pain   He has history of hypertension, diabetes, nonischemic cardiomyopathy with chronic systolic heart failure, automatic implanted cardioverter/defibrillator and comes in with cough and shortness of breath for the last 3 days.  He has not run any fever and has not had any chills or sweats.  He denies arthralgias or myalgias but has been having intermittent left-sided chest pain.  He describes a dull pain which is present for about 1 minute before resolving.  Pain is not affected by exertion, movement, breathing.  He has been slightly more short of breath than baseline.  He denies any sick contacts.  He is a non-smoker.   Home Medications Prior to Admission medications   Medication Sig Start Date End Date Taking? Authorizing Provider  Accu-Chek Softclix Lancets lancets Use as instructed 02/01/22   Lonell Grandchild, MD  acetaminophen (TYLENOL) 500 MG tablet Take 1,000 mg by mouth every 8 (eight) hours as needed.    [provider]  amiodarone (PACERONE) 200 MG tablet Take 1 tablet (200 mg total) by mouth daily. 08/15/22   Sheilah Pigeon, PA-C  apixaban (ELIQUIS) 5 MG TABS tablet Take 1 tablet (5 mg total) by mouth 2 (two) times daily. 06/30/23   Rachel Moulds, MD  atorvastatin (LIPITOR) 10 MG tablet Take 1 tablet by mouth once daily 08/19/23   Marcine Matar, MD  benzonatate (TESSALON) 200 MG capsule Take 1 capsule (200 mg total) by mouth 2 (two) times daily as needed for cough. 08/07/23   Claiborne Rigg, NP  Blood Glucose Monitoring Suppl (ACCU-CHEK  GUIDE) w/Device KIT UAD 02/01/22   Lonell Grandchild, MD  Blood Glucose Monitoring Suppl DEVI 1 each by Does not apply route in the morning, at noon, and at bedtime. May substitute to any manufacturer covered by patient's insurance. 12/23/22   Carlus Pavlov, MD  Blood Pressure Monitoring DEVI Dispense 1 bp monitor, check daily. 08/07/23   Claiborne Rigg, NP  cetirizine (EQ ALLERGY RELIEF, CETIRIZINE,) 10 MG tablet Take 1 tablet (10 mg total) by mouth daily. Patient taking differently: Take 10 mg by mouth in the morning. 10/30/22   Ambs, Norvel Richards, FNP  cholecalciferol (VITAMIN D3) 25 MCG (1000 UNIT) tablet Take 1,000 Units by mouth in the morning.    [provider]  clonazePAM (KLONOPIN) 0.5 MG tablet Take 1 tablet (0.5 mg total) by mouth 2 (two) times daily as needed for anxiety. 08/27/23 08/26/24  Stasia Cavalier, MD  Clotrimazole 1 % OINT Apply to the affected area twice a day 05/11/23   Marcine Matar, MD  Continuous Glucose Receiver (FREESTYLE LIBRE 2 READER) DEVI USE AS DIRECTED 03/06/23   Marcine Matar, MD  Continuous Glucose Sensor (FREESTYLE LIBRE 2 SENSOR) MISC Use to check blood sugar TID. Change sensor once every 14 days. E11.65 03/06/23   Hoy Register, MD  EPINEPHrine 0.3 mg/0.3 mL IJ SOAJ injection Inject 0.3 mg into the muscle as needed. 01/30/22   Alfonse Spruce, MD  famotidine (PEPCID) 20 MG tablet TAKE 1 TABLET  BY MOUTH 1 TO 2 TIMES DAILY AS NEEDED 07/14/23   Ambs, Norvel Richards, FNP  fluticasone [facility]) 50 MCG/ACT nasal spray Use 1-2 sprays in each nostril once a day as needed for nasal congestion. 08/07/23   Claiborne Rigg, NP  Fluticasone-Umeclidin-Vilant (TRELEGY ELLIPTA) 100-62.5-25 MCG/ACT AEPB Inhale 1 puff into the lungs daily. 06/29/23   Charlott Holler, MD  gabapentin (NEURONTIN) 300 MG capsule Take 1 capsule (300 mg total) by mouth at bedtime. 05/25/23   Marcine Matar, MD  glucose blood (ACCU-CHEK GUIDE) test strip Use as instructed 02/01/22    Lonell Grandchild, MD  glucose blood (FREESTYLE LITE) test strip Use as instructed.  Please make sure this is compatible with [facility] 2 reader. 07/14/22   Marcine Matar, MD  insulin aspart (NOVOLOG FLEXPEN) 100 UNIT/ML FlexPen Inject 30-38 Units into the skin 3 (three) times daily before meals. 08/13/23   Carlus Pavlov, MD  insulin glargine (LANTUS SOLOSTAR) 100 UNIT/ML Solostar Pen Inject 45 Units into the skin daily. 08/13/23   Carlus Pavlov, MD  Insulin Pen Needle (B-D ULTRAFINE III SHORT PEN) 31G X 8 MM MISC USE AS DIRECTED 05/25/23   Marcine Matar, MD  Lancet Devices [facility]) lancets 1 each by Other route 3 (three) times daily. 10/20/14   Funches, Gerilyn Nestle, MD  methocarbamol (ROBAXIN) 500 MG tablet Take 1 tablet (500 mg total) by mouth daily. Patient taking differently: Take 500 mg by mouth at bedtime. 04/24/23   Marcine Matar, MD  metolazone (ZAROXOLYN) 5 MG tablet Take 1 tablet (5 mg total) by mouth once a week. 06/15/23 09/13/23  [name], Aditya, DO  metoprolol succinate (TOPROL-XL) 50 MG 24 hr tablet Take 1 tablet (50 mg total) by mouth daily. 08/22/22   [name], Aditya, DO  nystatin (MYCOSTATIN/NYSTOP) powder Apply 1 Application topically 3 (three) times daily. 08/08/23   Claiborne Rigg, NP  potassium chloride SA (KLOR-CON M) 20 MEQ tablet Take 2 tablets (40 mEq total) by mouth daily. 05/13/23   [place], Anderson Malta, FNP  sacubitril-valsartan (ENTRESTO) 49-51 MG Take 1 tablet by mouth 2 (two) times daily. 05/21/23   [name], Aditya, DO  sertraline (ZOLOFT) 50 MG tablet Take 1.5 tablets (75 mg total) by mouth in the morning. 08/27/23 08/26/24  Stasia Cavalier, MD  SYMBICORT 160-4.5 MCG/ACT inhaler Inhale into the lungs. 08/28/23   [provider]  tamsulosin (FLOMAX) 0.4 MG CAPS capsule Take 2 capsules by mouth once daily 08/19/23   Marcine Matar, MD  torsemide (DEMADEX) 20 MG tablet Take 4 tablets (80 mg total) by mouth daily. 08/24/23   [name], Aditya,  DO  traZODone (DESYREL) 50 MG tablet Take 50 mg by mouth at bedtime. 08/28/23   [provider]      Allergies    Bee venom and Trulicity [dulaglutide]    Review of Systems   Review of Systems  Respiratory:  Positive for cough.   Cardiovascular:  Positive for chest pain.  All other systems reviewed and are negative.   Physical Exam Updated Vital Signs BP 107/70 (BP Location: Right Arm)   Pulse 68   Temp 97.8 F (36.6 C) (Oral)   Resp (!) 23   Ht 6\' 1"  (1.854 m)   Wt 128.8 kg   SpO2 99%   BMI 37.46 kg/m  Physical Exam Vitals and nursing note reviewed.   58 year old male, resting comfortably and in no acute distress. Vital signs are normal. Oxygen saturation is 94%, which is  normal. Head is normocephalic and atraumatic. PERRLA, EOMI. Oropharynx is clear. Neck is nontender and supple without adenopathy or JVD. Lungs are clear without rales, wheezes, or rhonchi. Chest is nontender.  AICD present on the left. Heart has regular rate and rhythm without murmur.  There is no chest wall tenderness. Abdomen is soft, flat, nontender. Extremities have no cyanosis or edema, full range of motion is present. Skin is warm and dry without rash. Neurologic: Mental status is normal, moves all extremities equally.  ED Results / Procedures / Treatments   Labs (all labs ordered are listed, but only abnormal results are displayed) Labs Reviewed  RESP PANEL BY RT-PCR (RSV, FLU A&B, COVID)  RVPGX2 - Abnormal; Notable for the following components:      Result Value   Resp Syncytial Virus by PCR POSITIVE (*)    All other components within normal limits  BASIC METABOLIC PANEL WITH GFR - Abnormal; Notable for the following components:   Glucose, Bld 175 (*)    BUN 23 (*)    Creatinine, Ser 2.71 (*)    GFR, Estimated 26 (*)    All other components within normal limits  CBC - Abnormal; Notable for the following components:   nRBC 0.7 (*)    All other components within normal limits   TROPONIN I (HIGH SENSITIVITY)  TROPONIN I (HIGH SENSITIVITY)   Radiology DG Chest 2 View Result Date: 09/07/2023 CLINICAL DATA:  Chest pain and cough. EXAM: CHEST - 2 VIEW COMPARISON:  April 24, 2023 FINDINGS: There is stable dual lead AICD positioning. The heart size and mediastinal contours are within normal limits. Mild linear atelectasis is seen within the left lung base. No pleural effusion or pneumothorax is identified. The visualized skeletal structures are unremarkable. IMPRESSION: Mild left basilar linear atelectasis. Electronically Signed   By: Aram Candela M.D.   On: 09/07/2023 17:44    Procedures Procedures  {Document cardiac monitor, telemetry assessment procedure when appropriate:1}  Medications Ordered in ED Medications - No data to display  ED Course/ Medical Decision Making/ A&P   {   Click here for ABCD2, HEART and other calculatorsREFRESH Note before signing :1}          HEART Score: 2                    Medical Decision Making Amount and/or Complexity of Data Reviewed Labs: ordered. Radiology: ordered.   Cough, shortness of breath, chest pain.  Doubt ACS-suspect underlying infection such as pneumonia or viral infection such as RSV or COVID-19 or influenza.  Chest x-ray shows no evidence of pneumonia.  I independently viewed the images, and agree with the radiologist's interpretation.  I have reviewed his electrocardiogram and my interpretation is low voltage but not changed from baseline.  I reviewed his laboratory tests, and my interpretation is stable renal insufficiency, normal CBC, normal troponin x 2.  Heart score is 2, which puts him at low risk for major adverse cardiac events in the next 6 weeks.  I have reviewed his past records, and note cardiac catheterization on 05/12/2022 showing no obstructive coronary artery disease.  Echocardiogram on 09/03/2023 showed decreased ejection fraction of 40-45% with global hypokinesis and grade 1 diastolic  dysfunction  {Document critical care time when appropriate:1} {Document review of labs and clinical decision tools ie heart score, Chads2Vasc2 etc:1}  {Document your independent review of radiology images, and any outside records:1} {Document your discussion with family members, caretakers, and with consultants:1} {Document social determinants  of health affecting pt's care:1} {Document your decision making why or why not admission, treatments were needed:1} Final Clinical Impression(s) / ED Diagnoses Final diagnoses:  None    Rx / DC Orders ED Discharge Orders     None

## 2023-09-07 NOTE — ED Notes (Signed)
 Patient is being discharged from the Urgent Care and sent to the Emergency Department via EMS . Per [facility], patient is in need of higher level of care due to hypotension, complicated medical history. Patient is aware and verbalizes understanding of plan of care.  Vitals:   09/07/23 1542 09/07/23 1600  BP: (!) 86/59 102/84  Pulse: (!) 110   Resp: (!) 22   Temp: 98.6 F (37 C)   SpO2: 93%

## 2023-09-07 NOTE — ED Provider Notes (Signed)
 [facility]    CSN: [id number] Arrival date & time: 09/07/23  1532      History   Chief Complaint Chief Complaint  Patient presents with   Cough    Coughing up yellow mucus, chest congestion and headache. - Entered by patient    HPI [name] is a 58 y.o. male.   58 year old male pt, [name], presents to urgent care for evaluation of cough, chest congestion, headache x 2 days.  Patient complaining of history of COPD and shortness of breath for 2 days patient states he is supposed to be on Trelegy but insurance has denied so he is without, patient denies chest pain,dizziness and vomiting.  PMH: CHF chronic systolic, Stage 3 KD, HTN, panic attacks, DM, AICD, COPD  The history is provided by the patient. No language interpreter was used.    Past Medical History:  Diagnosis Date   AICD (automatic cardioverter/defibrillator) present    Chronic bronchitis (HCC)    "get it most q yr" (12/23/2013)   Chronic systolic (congestive) heart failure (HCC)    Fracture of left humerus    a. 07/2013.   GERD (gastroesophageal reflux disease)    not at present time   Heart murmur    "born w/it"    History of renal calculi    Hypertension    Kidney stones    NICM (nonischemic cardiomyopathy) (HCC)    a. 07/2013 Echo: EF 20-25%, diff HK, Gr2 DD, mild MR, mod dil LA/RA. 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    Shockable heart rhythm detected by automated external defibrillator    Syncope    Type II diabetes mellitus (HCC) 2006    Patient Active Problem List   Diagnosis Date Noted   Hypotension 09/07/2023   Acute deep vein thrombosis (DVT) of left upper extremity (HCC) 04/24/2023   Cellulitis of perineum 04/02/2023   Heart failure (HCC) 02/28/2023   Class 3 obesity 02/28/2023   SOB (shortness of breath) 02/27/2023   Loud snoring 10/28/2022   Asthma-COPD overlap syndrome (HCC)  10/09/2022   Hyperlipidemia associated with type 2 diabetes mellitus (HCC) 09/18/2022   PTSD (post-traumatic stress disorder) 07/10/2022   Panic attack 06/17/2022   Passive suicidal ideations 06/17/2022   History of ventricular tachycardia 06/14/2022   Essential hypertension 06/14/2022   Stage 3b chronic kidney disease (HCC) 05/15/2022   Acute gout due to renal impairment involving left ankle 05/07/2022   Acute gout due to renal impairment involving right knee 05/07/2022   Hyponatremia 05/07/2022   Diarrhea 05/06/2022   Hyperglycemia 05/06/2022   Cellulitis of buttock 05/06/2022   Acute renal failure superimposed on stage 3b chronic kidney disease (HCC) 05/06/2022   Hypokalemia 05/05/2022   Rectal abscess 05/05/2022   Left buttock abscess 05/05/2022   Cardiac arrest (HCC) 05/03/2022   AKI (acute kidney injury) (HCC) 05/03/2022   Tachycardia 05/03/2022   CKD (chronic kidney disease) stage 2, GFR 60-89 ml/min 10/24/2019   Acute non-recurrent frontal sinusitis 06/27/2019   COVID-19 virus infection 06/27/2019   Glaucoma suspect 12/22/2018   Seasonal and perennial allergic rhinoconjunctivitis 10/25/2018   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   Urticaria 05/03/2018   S/P inguinal hernia repair 03/01/2018   Unilateral inguinal hernia without obstruction or gangrene 07/24/2017   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   Hypersomnia 02/28/2016   Abnormal PFT 02/24/2016   Dyspnea 01/28/2016  Diabetic polyneuropathy associated with type 2 diabetes mellitus (HCC) 12/18/2015   Lumbar back pain 12/18/2015   Type 2 diabetes mellitus with hyperlipidemia (HCC) 09/17/2015   Left median nerve neuropathy 04/16/2015   Lesion of right median nerve at forearm 04/16/2015   ICD (implantable cardioverter-defibrillator) in place 03/28/2014   Chronic systolic congestive heart failure (HCC) 03/28/2014   Nonischemic cardiomyopathy (HCC) 11/09/2013   Syncope 10/20/2013    Chest pain 07/28/2013   RENAL CALCULUS 05/13/2009   Obesity 05/08/2009   Former heavy cigarette smoker (20-39 per day) 05/08/2009   HOARSENESS 05/08/2009   GAD (generalized anxiety disorder) 02/28/2008   ASTHMATIC BRONCHITIS, ACUTE 02/28/2008   GERD 02/28/2008   IRRITABLE BOWEL SYNDROME 02/28/2008    Past Surgical History:  Procedure Laterality Date   CARDIAC CATHETERIZATION  08/10/13   CHOLECYSTECTOMY  ~ 2010   IMPLANTABLE CARDIOVERTER DEFIBRILLATOR IMPLANT  12/23/2013   STJ Fortify ICD implanted by Dr Johney Frame for cardiomyopathy and syncope   IMPLANTABLE CARDIOVERTER DEFIBRILLATOR IMPLANT N/A 12/23/2013   Procedure: IMPLANTABLE CARDIOVERTER DEFIBRILLATOR IMPLANT;  Surgeon: Gardiner Rhyme, MD;  Location: [facility] CATH LAB;  Service: Cardiovascular;  Laterality: N/A;   INGUINAL HERNIA REPAIR Left 03/01/2018   Procedure: OPEN REPAIR OF LEFT INGUINAL HERNIA WITH MESH;  Surgeon: Gaynelle Adu, MD;  Location: [facility] ORS;  Service: General;  Laterality: Left;   LEAD REVISION/REPAIR N/A 05/15/2022   Procedure: LEAD REVISION/REPAIR;  Surgeon: Lanier Prude, MD;  Location: [facility] INVASIVE CV LAB;  Service: Cardiovascular;  Laterality: N/A;   LEFT HEART CATHETERIZATION WITH CORONARY ANGIOGRAM N/A 08/10/2013   Procedure: LEFT HEART CATHETERIZATION WITH CORONARY ANGIOGRAM;  Surgeon: Corky Crafts, MD;  Location: [facility] CATH LAB;  Service: Cardiovascular;  Laterality: N/A;   ORIF HUMERUS FRACTURE Left 08/12/2013   Procedure: OPEN REDUCTION INTERNAL FIXATION (ORIF) HUMERAL SHAFT FRACTURE;  Surgeon: Nadara Mustard, MD;  Location: [facility] OR;  Service: Orthopedics;  Laterality: Left;  Open Reduction Internal Fixation Left Humerus   RIGHT/LEFT HEART CATH AND CORONARY ANGIOGRAPHY N/A 05/12/2022   Procedure: RIGHT/LEFT HEART CATH AND CORONARY ANGIOGRAPHY;  Surgeon: Dorthula Nettles, DO;  Location: [facility] INVASIVE CV LAB;  Service: Cardiovascular;  Laterality: N/A;   URETEROSCOPY     "laser for kidney stones"       Home  Medications    Prior to Admission medications   Medication Sig Start Date End Date Taking? Authorizing Provider  apixaban (ELIQUIS) 5 MG TABS tablet Take 1 tablet (5 mg total) by mouth 2 (two) times daily. 06/30/23  Yes Rachel Moulds, MD  atorvastatin (LIPITOR) 10 MG tablet Take 1 tablet by mouth once daily 08/19/23  Yes Marcine Matar, MD  benzonatate (TESSALON) 200 MG capsule Take 1 capsule (200 mg total) by mouth 2 (two) times daily as needed for cough. 08/07/23  Yes Claiborne Rigg, NP  clonazePAM (KLONOPIN) 0.5 MG tablet Take 1 tablet (0.5 mg total) by mouth 2 (two) times daily as needed for anxiety. 08/27/23 08/26/24 Yes Stasia Cavalier, MD  fluticasone [facility]) 50 MCG/ACT nasal spray Use 1-2 sprays in each nostril once a day as needed for nasal congestion. 08/07/23  Yes Claiborne Rigg, NP  insulin aspart (NOVOLOG FLEXPEN) 100 UNIT/ML FlexPen Inject 30-38 Units into the skin 3 (three) times daily before meals. 08/13/23  Yes Carlus Pavlov, MD  insulin glargine (LANTUS SOLOSTAR) 100 UNIT/ML Solostar Pen Inject 45 Units into the skin daily. 08/13/23  Yes Carlus Pavlov, MD  methocarbamol (ROBAXIN) 500 MG tablet Take 1 tablet (500 mg  total) by mouth daily. Patient taking differently: Take 500 mg by mouth at bedtime. 04/24/23  Yes Marcine Matar, MD  metolazone (ZAROXOLYN) 5 MG tablet Take 1 tablet (5 mg total) by mouth once a week. 06/15/23 09/13/23 Yes [name], Aditya, DO  metoprolol succinate (TOPROL-XL) 50 MG 24 hr tablet Take 1 tablet (50 mg total) by mouth daily. 08/22/22  Yes [name], Aditya, DO  nystatin (MYCOSTATIN/NYSTOP) powder Apply 1 Application topically 3 (three) times daily. 08/08/23  Yes Claiborne Rigg, NP  potassium chloride SA (KLOR-CON M) 20 MEQ tablet Take 2 tablets (40 mEq total) by mouth daily. 05/13/23  Yes Milford, Anderson Malta, FNP  sertraline (ZOLOFT) 50 MG tablet Take 1.5 tablets (75 mg total) by mouth in the morning. 08/27/23 08/26/24 Yes Stasia Cavalier, MD   SYMBICORT 160-4.5 MCG/ACT inhaler Inhale into the lungs. 08/28/23  Yes [provider]  torsemide (DEMADEX) 20 MG tablet Take 4 tablets (80 mg total) by mouth daily. 08/24/23  Yes [name], Aditya, DO  traZODone (DESYREL) 50 MG tablet Take 50 mg by mouth at bedtime. 08/28/23  Yes [provider]  Accu-Chek Softclix Lancets lancets Use as instructed 02/01/22   Lonell Grandchild, MD  acetaminophen (TYLENOL) 500 MG tablet Take 1,000 mg by mouth every 8 (eight) hours as needed.    [provider]  amiodarone (PACERONE) 200 MG tablet Take 1 tablet (200 mg total) by mouth daily. 08/15/22   Sheilah Pigeon, PA-C  Blood Glucose Monitoring Suppl (ACCU-CHEK GUIDE) w/Device KIT UAD 02/01/22   Lonell Grandchild, MD  Blood Glucose Monitoring Suppl DEVI 1 each by Does not apply route in the morning, at noon, and at bedtime. May substitute to any manufacturer covered by patient's insurance. 12/23/22   Carlus Pavlov, MD  Blood Pressure Monitoring DEVI Dispense 1 bp monitor, check daily. 08/07/23   Claiborne Rigg, NP  cetirizine (EQ ALLERGY RELIEF, CETIRIZINE,) 10 MG tablet Take 1 tablet (10 mg total) by mouth daily. Patient taking differently: Take 10 mg by mouth in the morning. 10/30/22   Ambs, Norvel Richards, FNP  cholecalciferol (VITAMIN D3) 25 MCG (1000 UNIT) tablet Take 1,000 Units by mouth in the morning.    [provider]  Clotrimazole 1 % OINT Apply to the affected area twice a day 05/11/23   Marcine Matar, MD  Continuous Glucose Receiver (FREESTYLE LIBRE 2 READER) DEVI USE AS DIRECTED 03/06/23   Marcine Matar, MD  Continuous Glucose Sensor (FREESTYLE LIBRE 2 SENSOR) MISC Use to check blood sugar TID. Change sensor once every 14 days. E11.65 03/06/23   Hoy Register, MD  EPINEPHrine 0.3 mg/0.3 mL IJ SOAJ injection Inject 0.3 mg into the muscle as needed. 01/30/22   Alfonse Spruce, MD  famotidine (PEPCID) 20 MG tablet TAKE 1 TABLET BY MOUTH 1 TO 2 TIMES DAILY  AS NEEDED 07/14/23   Ambs, Norvel Richards, FNP  Fluticasone-Umeclidin-Vilant (TRELEGY ELLIPTA) 100-62.5-25 MCG/ACT AEPB Inhale 1 puff into the lungs daily. 06/29/23   Charlott Holler, MD  gabapentin (NEURONTIN) 300 MG capsule Take 1 capsule (300 mg total) by mouth at bedtime. 05/25/23   Marcine Matar, MD  glucose blood (ACCU-CHEK GUIDE) test strip Use as instructed 02/01/22   Lonell Grandchild, MD  glucose blood (FREESTYLE LITE) test strip Use as instructed.  Please make sure this is compatible with [facility] 2 reader. 07/14/22   Marcine Matar, MD  Insulin Pen Needle (B-D ULTRAFINE III SHORT PEN) 31G X 8 MM  MISC USE AS DIRECTED 05/25/23   Marcine Matar, MD  Lancet Devices [facility]) lancets 1 each by Other route 3 (three) times daily. 10/20/14   Funches, Gerilyn Nestle, MD  sacubitril-valsartan (ENTRESTO) 49-51 MG Take 1 tablet by mouth 2 (two) times daily. 05/21/23   Dorthula Nettles, DO  tamsulosin (FLOMAX) 0.4 MG CAPS capsule Take 2 capsules by mouth once daily 08/19/23   Marcine Matar, MD    Family History Family History  Problem Relation Age of Onset   Diabetes Father    Arthritis Father    Hypertension Father    Diabetes Mother    Arthritis Mother    Hypertension Mother    Hypertension Brother     Social History Social History   Tobacco Use   Smoking status: Former    Current packs/day: 0.00    Average packs/day: 1 pack/day for 28.0 years (28.0 ttl pk-yrs)    Types: Cigarettes    Start date: 06/03/1986    Quit date: 06/03/2014    Years since quitting: 9.2   Smokeless tobacco: Never  Vaping Use   Vaping status: Never Used  Substance Use Topics   Alcohol use: Not Currently   Drug use: Yes    Types: Marijuana    Comment: occasionally;     Allergies   Bee venom and Trulicity [dulaglutide]   Review of Systems Review of Systems  Constitutional:  Negative for fever.  HENT:  Positive for congestion.   Respiratory:  Positive for cough and shortness of breath.    Cardiovascular:  Negative for chest pain.  All other systems reviewed and are negative.    Physical Exam Triage Vital Signs ED Triage Vitals  Encounter Vitals Group     BP 09/07/23 1542 (!) 86/59     Systolic BP Percentile --      Diastolic BP Percentile --      Pulse Rate 09/07/23 1542 (!) 110     Resp 09/07/23 1542 (!) 22     Temp 09/07/23 1542 98.6 F (37 C)     Temp Source 09/07/23 1542 Oral     SpO2 09/07/23 1542 93 %     Weight 09/07/23 1539 284 lb (128.8 kg)     Height 09/07/23 1539 6\' 1"  (1.854 m)     Head Circumference --      Peak Flow --      Pain Score 09/07/23 1539 0     Pain Loc --      Pain Education --      Exclude from Growth Chart --    No data found.  Updated Vital Signs BP 102/84 (BP Location: Right Arm)   Pulse (!) 110   Temp 98.6 F (37 C) (Oral)   Resp (!) 22   Ht 6\' 1"  (1.854 m)   Wt 284 lb (128.8 kg)   SpO2 93%   BMI 37.47 kg/m   Visual Acuity Right Eye Distance:   Left Eye Distance:   Bilateral Distance:    Right Eye Near:   Left Eye Near:    Bilateral Near:     Physical Exam Vitals and nursing note reviewed.  Constitutional:      Appearance: He is well-developed and well-groomed.  HENT:     Head: Normocephalic.  Cardiovascular:     Rate and Rhythm: Tachycardia present.     Heart sounds: Normal heart sounds.  Pulmonary:     Effort: Tachypnea present.     Breath sounds: Decreased air movement  present. Examination of the right-lower field reveals decreased breath sounds. Examination of the left-lower field reveals decreased breath sounds. Decreased breath sounds present. No wheezing.     Comments: coarse Neurological:     General: No focal deficit present.     Mental Status: He is alert and oriented to person, place, and time.     GCS: GCS eye subscore is 4. GCS verbal subscore is 5. GCS motor subscore is 6.  Psychiatric:        Attention and Perception: Attention normal.        Mood and Affect: Mood is anxious.         Speech: Speech normal.        Behavior: Behavior normal. Behavior is cooperative.      UC Treatments / Results  Labs (all labs ordered are listed, but only abnormal results are displayed) Labs Reviewed  POCT FASTING CBG [facility] MANUAL ENTRY - Abnormal; Notable for the following components:      Result Value   POCT Glucose ([facility]) 214 (*)    All other components within normal limits  POCT INFLUENZA A/B - Normal    EKG   Radiology No results found.  Procedures Procedures (including critical care time)  Medications Ordered in UC Medications - No data to display  Initial Impression / Assessment and Plan / UC Course  I have reviewed the triage vital signs and the nursing notes.  Pertinent labs & imaging results that were available during my care of the patient were reviewed by me and considered in my medical decision making (see chart for details).  Clinical Course as of 09/07/23 1635  Mon Sep 07, 2023  1629 EMS here for transport to ER for further evaluation of shortness of breath with extensive cardiac history [JD]    Clinical Course User Index [JD] Mariona Scholes, Para March, NP    Ddx: SOB,hypotension, hyperglycemia, tachycardia Final Clinical Impressions(s) / UC Diagnoses   Final diagnoses:  SOB (shortness of breath)  Hypotension, unspecified hypotension type  Hyperglycemia  Tachycardia     Discharge Instructions      BS is 214 in office Your flu test is negative Please go directly to ER for further evaluation of SOB    ED Prescriptions   None    PDMP not reviewed this encounter.   Clancy Gourd, NP 09/07/23 [phone]

## 2023-09-07 NOTE — Discharge Instructions (Addendum)
 BS is 214 in office Your flu test is negative Please go directly to ER for further evaluation of SOB

## 2023-09-08 ENCOUNTER — Encounter: Payer: Self-pay | Admitting: Internal Medicine

## 2023-09-08 NOTE — Telephone Encounter (Signed)
 Copied from CRM [id number]. Topic: Clinical - Medical Advice >> Sep 07, 2023 10:26 AM Renie Ora wrote: Reason for CRM: Patient called in and stated he is sick. Patient stated he has been coughing up phelm, congested in his chest and has a cough. Patient stated he has had the symptoms since Saturday and has not been to an urgent care or emergency room. Patient would like something called in for his symptoms.  FYI Patient was seen in the ED on 09/07/2022

## 2023-09-08 NOTE — Discharge Instructions (Addendum)
 Make sure you are drinking enough fluids.  Take acetaminophen as needed for fever or aching.  Use your albuterol inhaler every 4 hours as needed for cough or shortness of breath.  If symptoms are worsening, please return to the emergency department for further evaluation.  Because of your underlying conditions, I feel it is very important that you get immunization for illnesses that can affect the lungs.  Please talk with your providers about possibly getting pneumococcal vaccine, RSV vaccine, COVID-vaccine updates as needed, annual flu vaccine.

## 2023-09-09 ENCOUNTER — Telehealth: Admitting: Family Medicine

## 2023-09-09 ENCOUNTER — Encounter: Payer: Self-pay | Admitting: Family Medicine

## 2023-09-09 DIAGNOSIS — B338 Other specified viral diseases: Secondary | ICD-10-CM | POA: Diagnosis not present

## 2023-09-09 DIAGNOSIS — B356 Tinea cruris: Secondary | ICD-10-CM | POA: Diagnosis not present

## 2023-09-09 MED ORDER — CLOTRIMAZOLE 1 % EX OINT: TOPICAL_OINTMENT | CUTANEOUS | 0 refills | Status: DC

## 2023-09-09 NOTE — Progress Notes (Signed)
 Virtual Visit via Video Note  I connected with [name], on 09/09/2023 at 1:46 PM by video enabled telemedicine device and verified that I am speaking with the correct person using two identifiers.   Consent: I discussed the limitations, risks, security and privacy concerns of performing an evaluation and management service by telemedicine and the availability of in person appointments. I also discussed with the patient that there may be a patient responsible charge related to this service. The patient expressed understanding and agreed to proceed.   Location of Patient: Home  Location of Provider: Clinic   Persons participating in Telemedicine visit: DARROLL BREDESON Dr. Alvis Lemmings    HPI  58 year old male patient of Dr. Laural Benes with a history of type 2 diabetes mellitus, diabetic neuropathy, obesity, GAD, CHF (status post ICD), asthma COPD overlap syndrome who is seen for an acute visit. He complains of a foul smell in his groin which he states he has had in the past.  He was prescribed nystatin powder but stated he has not noticed any improvement in symptoms.  He endorses adhering to proper hygiene.  Denies presence of rash presence of erythema in his groin. 2 days ago he had an ED visit where he was diagnosed with RSV and symptomatic management was recommended.   Past Medical History:  Diagnosis Date   AICD (automatic cardioverter/defibrillator) present    Chronic bronchitis (HCC)    "get it most q yr" (12/23/2013)   Chronic systolic (congestive) heart failure (HCC)    Fracture of left humerus    a. 07/2013.   GERD (gastroesophageal reflux disease)    not at present time   Heart murmur    "born w/it"    History of renal calculi    Hypertension    Kidney stones    NICM (nonischemic cardiomyopathy) (HCC)    a. 07/2013 Echo: EF 20-25%, diff HK, Gr2 DD, mild MR, mod dil LA/RA. 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 Patient experience onset of diarrhea with using this medication - cleared after med stopped.    Current Outpatient Medications on File Prior to Visit  Medication Sig Dispense Refill   Accu-Chek Softclix Lancets lancets Use as instructed 100 each 12   acetaminophen (TYLENOL) 500 MG tablet Take 1,000 mg by mouth every 8 (eight) hours as needed.     amiodarone (PACERONE) 200 MG tablet Take 1 tablet (200 mg total) by mouth daily. 90 tablet 3   apixaban (ELIQUIS) 5 MG TABS tablet Take 1 tablet (5 mg total) by mouth 2 (two) times daily. 60 tablet 3   atorvastatin (LIPITOR) 10 MG tablet Take 1 tablet by mouth once daily 90 tablet 0   benzonatate (TESSALON) 200 MG capsule Take 1 capsule (200 mg total) by mouth 2 (two) times daily as needed for cough. 30 capsule 1   Blood Glucose Monitoring Suppl (ACCU-CHEK GUIDE) w/Device KIT UAD 1 kit 0   Blood Glucose Monitoring Suppl DEVI 1 each by Does not apply route in the morning, at noon, and at bedtime. May substitute to any manufacturer covered by patient's insurance. 1  each 0   Blood Pressure Monitoring DEVI Dispense 1 bp monitor, check daily. 1 each 0   cetirizine (EQ ALLERGY RELIEF, CETIRIZINE,) 10 MG tablet Take 1 tablet (10 mg total) by mouth daily. (Patient taking differently: Take 10 mg by mouth in the morning.) 90 tablet 1   cholecalciferol (VITAMIN D3) 25 MCG (1000 UNIT) tablet Take 1,000 Units by mouth in the morning.     clonazePAM (KLONOPIN) 0.5 MG tablet Take 1 tablet (0.5 mg total) by mouth 2 (two) times daily as needed for anxiety. 60 tablet 1   Continuous Glucose Receiver (FREESTYLE LIBRE 2 READER) DEVI USE AS DIRECTED 1 each 0    Continuous Glucose Sensor (FREESTYLE LIBRE 2 SENSOR) MISC Use to check blood sugar TID. Change sensor once every 14 days. E11.65 2 each 11   EPINEPHrine 0.3 mg/0.3 mL IJ SOAJ injection Inject 0.3 mg into the muscle as needed. 1 each 1   famotidine (PEPCID) 20 MG tablet TAKE 1 TABLET BY MOUTH 1 TO 2 TIMES DAILY AS NEEDED 180 tablet 0   fluticasone (FLONASE) 50 MCG/ACT nasal spray Use 1-2 sprays in each nostril once a day as needed for nasal congestion. 16 g 5   Fluticasone-Umeclidin-Vilant (TRELEGY ELLIPTA) 100-62.5-25 MCG/ACT AEPB Inhale 1 puff into the lungs daily. 1 each 11   gabapentin (NEURONTIN) 300 MG capsule Take 1 capsule (300 mg total) by mouth at bedtime. 90 capsule 1   glucose blood (ACCU-CHEK GUIDE) test strip Use as instructed 100 each 12   glucose blood (FREESTYLE LITE) test strip Use as instructed.  Please make sure this is compatible with [facility] 2 reader. 100 each 12   insulin aspart (NOVOLOG FLEXPEN) 100 UNIT/ML FlexPen Inject 30-38 Units into the skin 3 (three) times daily before meals. 60 mL 3   insulin glargine (LANTUS SOLOSTAR) 100 UNIT/ML Solostar Pen Inject 45 Units into the skin daily. 45 mL 3   Insulin Pen Needle (B-D ULTRAFINE III SHORT PEN) 31G X 8 MM MISC USE AS DIRECTED 100 each 6   Lancet Devices (ACCU-CHEK SOFTCLIX) lancets 1 each by Other route 3 (three) times daily. 1 each 0   methocarbamol (ROBAXIN) 500 MG tablet Take 1 tablet (500 mg total) by mouth daily. (Patient taking differently: Take 500 mg by mouth at bedtime.) 90 tablet 1   metolazone (ZAROXOLYN) 5 MG tablet Take 1 tablet (5 mg total) by mouth once a week.     metoprolol succinate (TOPROL-XL) 50 MG 24 hr tablet Take 1 tablet (50 mg total) by mouth daily. 90 tablet 3   nystatin (MYCOSTATIN/NYSTOP) powder Apply 1 Application topically 3 (three) times daily. 60 g 1   potassium chloride SA (KLOR-CON M) 20 MEQ tablet Take 2 tablets (40 mEq total) by mouth daily. 60 tablet 3   sacubitril-valsartan (ENTRESTO) 49-51  MG Take 1 tablet by mouth 2 (two) times daily. 60 tablet 3   sertraline (ZOLOFT) 50 MG tablet Take 1.5 tablets (75 mg total) by mouth in the morning. 45 tablet 2   SYMBICORT 160-4.5 MCG/ACT inhaler Inhale into the lungs.     tamsulosin (FLOMAX) 0.4 MG CAPS capsule Take 2 capsules by mouth once daily 180 capsule 0   torsemide (DEMADEX) 20 MG tablet Take 4 tablets (80 mg total) by mouth daily. 120 tablet 3   traZODone (DESYREL) 50 MG tablet Take 50 mg by mouth at bedtime.     No current facility-administered medications on file prior to visit.    ROS: See HPI  Observations/Objective:  Awake, alert, oriented x3 Not in acute distress Normal mood      Latest Ref Rng & Units 09/07/2023    6:01 PM 07/30/2023    4:45 PM 05/21/2023   12:05 PM  CMP  Glucose 70 - 99 mg/dL 782  956  213   BUN 6 - 20 mg/dL 23  36  27   Creatinine 0.61 - 1.24 mg/dL 0.86  5.78  4.69   Sodium 135 - 145 mmol/L 139  139  138   Potassium 3.5 - 5.1 mmol/L 4.4  4.6  4.1   Chloride 98 - 111 mmol/L 104  106  101   CO2 22 - 32 mmol/L 23  20  23    Calcium 8.9 - 10.3 mg/dL 9.9  9.6  9.4     Lipid Panel     Component Value Date/Time   CHOL 166 02/13/2023 1658   TRIG 283 (H) 02/13/2023 1658   HDL 44 02/13/2023 1658   CHOLHDL 3.8 02/13/2023 1658   CHOLHDL 4 05/08/2009 1040   VLDL 27.0 05/08/2009 1040   LDLCALC 76 02/13/2023 1658   LABVLDL 46 (H) 02/13/2023 1658    Lab Results  Component Value Date   HGBA1C 8.9 (A) 07/09/2023    Assessment & Plan  1. Tinea cruris (Primary) Strongly encouraged to adhere to proper hygiene measures Avoid tight clothing and allow proper aeration while at home He denies symptoms of cellulitis Advised that if symptoms persist he would need an office visit - Clotrimazole 1 % OINT; Apply to the affected area twice a day  Dispense: 56.7 g; Refill: 0  2. Infection due to respiratory syncytial virus (RSV), unspecified infection type Counseled on pathophysiology of RSV and  symptomatic management He is a high risk patient given underlying COPD and has been advised that if dyspnea worsens he needs to present back to the ED Recommended to obtain the RSV vaccine once he feels better.      Meds ordered this encounter  Medications   Clotrimazole 1 % OINT    Sig: Apply to the affected area twice a day    Dispense:  56.7 g    Refill:  0    Follow Up Instructions: Keep Previously scheduled appointment with PCP   I discussed the assessment and treatment plan with the patient. The patient was provided an opportunity to ask questions and all were answered. The patient agreed with the plan and demonstrated an understanding of the instructions.   The patient was advised to call back or seek an in-person evaluation if the symptoms worsen or if the condition fails to improve as anticipated.     I provided 16 minutes total of Telehealth time during this encounter including median intraservice time, reviewing previous notes, investigations, ordering medications, medical decision making, coordinating care and patient verbalized understanding at the end of the visit.     Hoy Register, MD, FAAFP. [facility] [place], [place] [phone]   09/09/2023, 1:46 PM

## 2023-09-09 NOTE — Patient Instructions (Signed)
Jock Itch Jock itch is an itchy rash in the groin and upper thigh area. It is a skin infection that is caused by a type of germ (fungus). The rash usually goes away in 2-3 weeks with treatment. What are the causes? This condition may be caused by: Touching a fungal infection elsewhere on your body, such as athlete's foot, and then touching your groin area. Sharing towels or clothing, such as socks or shoes, with someone who has a fungal infection. What increases the risk? This condition is most common in men and adolescent boys. You are also more likely to develop the condition if you: Are in a hot, humid climate. Wear tight-fitting clothes or wet bathing suits for a long time. Play sports. Are overweight. Have diabetes. Have a weakened body defense system (immune system). Sweat a lot. What are the signs or symptoms? Symptoms of this condition include: A red, pink, or brown rash in the groin area. There may be blisters. The rash may spread to your thighs, the opening between the butt (anus), and the butt. Dry and scaly skin on or around the rash. Itchiness. How is this treated? Treatment for this condition may include: Medicine to kill the fungus (antifungal). This may be one of the following: Skin cream. Ointment. Powder. Medicine that you take by mouth. Skin cream or ointment to reduce itching. Lifestyle changes, such as wearing looser clothing and caring for your skin. Follow these instructions at home: Skin care Use skin creams, ointments, or powders exactly as told by your doctor. Wear clothes that are loose. Clothes should not rub against your groin area. Men should wear boxer shorts or loose underwear. Keep your groin area clean and dry. Change your underwear every day. Change out of wet bathing suits as soon as you can. After bathing, use a different towel to dry your groin area. Dry the area gently and completely. Avoid hot baths and showers. Hot water can make itching  worse. Do not scratch the area. General instructions Take and apply over-the-counter and prescription medicines only as told by your doctor. Do not share towels or clothing with other people. Wash your hands often with soap and water for at least 20 seconds, especially after touching your groin area. If you cannot use soap and water, use hand sanitizer. When at the gym: Always wear shoes, especially in the shower and around the swimming pool. Keep any cuts covered. Clean any mats or equipment before using them. Shower as soon as you are done working out. Keep all follow-up visits. Contact a doctor if: Your rash: Gets worse. Does not get better after 2 weeks of treatment. Spreads. Comes back after treatment is done. You have a fever. You have new or more redness, swelling, or pain around your rash. You have fluid, blood, or pus coming from your rash. Summary Jock itch is an itchy rash. It affects the groin and upper thigh area. Jock itch usually goes away in 2-3 weeks with treatment. Keep your groin area clean and dry. This information is not intended to replace advice given to you by your health care provider. Make sure you discuss any questions you have with your health care provider. Document Revised: 08/14/2020 Document Reviewed: 08/14/2020 Elsevier Patient Education  2024 [employer].

## 2023-09-10 ENCOUNTER — Encounter: Payer: Self-pay | Admitting: Internal Medicine

## 2023-09-14 ENCOUNTER — Telehealth: Payer: Self-pay

## 2023-09-14 MED ORDER — DOXYCYCLINE HYCLATE 100 MG PO TABS: 100.0000 mg | ORAL_TABLET | Freq: Two times a day (BID) | ORAL | 0 refills | Status: DC

## 2023-09-14 MED ORDER — TRELEGY ELLIPTA 100-62.5-25 MCG/ACT IN AEPB: INHALATION_SPRAY | RESPIRATORY_TRACT | Status: DC

## 2023-09-14 NOTE — Telephone Encounter (Signed)
 I will send in Doxycycline 100mg  twice daily x 7 days. Can we give him Trelegy sample . Please take Symbicort of his med list

## 2023-09-14 NOTE — Telephone Encounter (Signed)
 Called and spoke with pt regarding Beth's note. Pt verbalized understanding and will pick sample up tmr. Nothing further needed.

## 2023-09-14 NOTE — Telephone Encounter (Signed)
 Copied from CRM [id number]. Topic: Clinical - Medical Advice >> Sep 14, 2023  1:06 PM Renie Ora wrote: Reason for CRM: Patient called and stated he is very sick. Patient stated he has been to urgent care and the emergency room. Patient stated he was diagnosed with RSV at the emergency room and he has had it for two weeks. Patient stated he is coughing up phelm and it is sitting in his chest. Patient is requesting an antibiotic and stated he cannot seem to get one.  Called and spoke with pt. Coughing up yellow/ brown phlegm (1wk). Was told he would feel better in a few days but pt is having aggravating coughing. Currently not taking any meds to treat. Using albuterol inhaler 2 puffs in am & 2 at night. Stopped using trelegy due to insurance not covering & symbicort made pt sick. Pt thinks he may have low grade fever. Does not use oxygen. Pt states he cannot use mucinex or over the counter meds due to prostate problem. Requesting antibiotics ASAP  Please advise Beth.

## 2023-09-15 ENCOUNTER — Ambulatory Visit: Payer: Medicaid Other | Admitting: Primary Care

## 2023-09-15 NOTE — Telephone Encounter (Signed)
 Copied from CRM [id number]. Topic: Clinical - Medical Advice >> Sep 14, 2023  1:06 PM Renie Ora wrote: Reason for CRM: Patient called and stated he is very sick. Patient stated he has been to urgent care and the emergency room. Patient stated he was diagnosed with RSV at the emergency room and he has had it for two weeks. Patient stated he is coughing up phelm and it is sitting in his chest. Patient is requesting an antibiotic and stated he cannot seem to get one.  Beth already sent in medication for this patient . Will close this encounter Nothing else further needed.

## 2023-09-24 ENCOUNTER — Other Ambulatory Visit (HOSPITAL_COMMUNITY): Payer: Self-pay | Admitting: Family Medicine

## 2023-09-28 ENCOUNTER — Encounter: Attending: Cardiology

## 2023-09-28 DIAGNOSIS — I5022 Chronic systolic (congestive) heart failure: Secondary | ICD-10-CM | POA: Insufficient documentation

## 2023-09-28 DIAGNOSIS — Z95810 Presence of automatic (implantable) cardiac defibrillator: Secondary | ICD-10-CM | POA: Insufficient documentation

## 2023-09-28 NOTE — Progress Notes (Signed)
 EPIC Encounter for ICM Monitoring  Patient Name: [name] is a 58 y.o. male Date: 09/28/2023 Primary Care Physican: Lawrance Presume, MD Primary Cardiologist: Crenshaw/[name] Electrophysiologist: Marven Slimmer 01/07/2023 Weight: 280 lbs 02/13/2023 Weight: 297 lbs 03/25/2023 Weight: 297 lbs    06/12/2023 Weight: 289 lbs   07/16/2023 Weight: Not weighing   07/28/2023 Weight: 284 lbs  08/24/2023 Office Weight: 295 lbs    09/28/2023 Weight: 291 lbs                Spoke with patient and heart failure questions reviewed.  Transmission results reviewed.  Pt reports weight gain of 7 pounds in the last couple of weeks and swelling of the feet.  He completed antibiotic for RSV.    He currently has gout in both feet.    Corvue thoracic impedance suggesting possible fluid accumulation starting 4/14 (correlating with dx of RSV).  Also suggesting possible fluid accumulation from 3/17-3/31 (correlating with ED visit for respiratory infection).   Prescribed dosage:  Torsemide  20 mg take 4 tablet(s) (80 mg total) by mouth twice a day Potassium 20 mEq take 2 tablets (40 mEq total) by mouth every day.    Metolazone  5 mg take 1 tablet by mouth once a week (Nephrologist said he could take it up to twice a week).     Labs: 09/07/2023 Creatinine 2.71, BUN 23, Potassium 4.4, Sodium 139, GFR 26 07/30/2023 Creatinine 2.74, BUN 36, Potassium 4.6, Sodium 139, GFR 26 05/21/2023 Creatinine 2.56, BUN 27, Potassium 4.1, Sodium 138, GFR 28  05/13/2023 Creatinine 2.75, BUN 23, Potassium 3.9, Sodium 138, GFR 26  A complete set of results can be found in Results Review.   Recommendations:  Pt will take Metolazone  1 tablet x 1 day as prescribed since he has not taken one in the last week.     Copy sent to Dr Bruce Caper as Arlie Lain.   Follow-up plan: ICM clinic phone appointment on 10/05/2023 to recheck fluid levels.   91 day device clinic remote transmission 11/18/2023.     EP/Cardiology Office Visits:  Recall 11/02/2023 with Dr  Marven Slimmer or [name], PA.  Recall 12/22/2023 with [name].   Copy of ICM check sent to Dr Marven Slimmer.  3 month ICM trend: 09/28/2023.    12-14 Month ICM trend:     [name], RN 09/28/2023 11:19 AM
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
## 2023-09-29 ENCOUNTER — Encounter: Payer: Self-pay | Admitting: Internal Medicine

## 2023-09-29 ENCOUNTER — Other Ambulatory Visit: Payer: Self-pay | Admitting: Nurse Practitioner

## 2023-09-29 DIAGNOSIS — B356 Tinea cruris: Secondary | ICD-10-CM

## 2023-09-30 ENCOUNTER — Encounter (HOSPITAL_BASED_OUTPATIENT_CLINIC_OR_DEPARTMENT_OTHER): Payer: Self-pay | Admitting: Internal Medicine

## 2023-09-30 ENCOUNTER — Ambulatory Visit (HOSPITAL_COMMUNITY): Admission: RE | Admit: 2023-09-30 | Payer: Medicaid Other | Source: Ambulatory Visit

## 2023-09-30 ENCOUNTER — Other Ambulatory Visit: Payer: Self-pay | Admitting: Internal Medicine

## 2023-09-30 DIAGNOSIS — M109 Gout, unspecified: Secondary | ICD-10-CM | POA: Diagnosis not present

## 2023-09-30 MED ORDER — CETIRIZINE HCL 10 MG PO TABS: 10.0000 mg | ORAL_TABLET | Freq: Every day | ORAL | 1 refills | Status: DC

## 2023-09-30 MED ORDER — PREDNISONE 20 MG PO TABS: ORAL_TABLET | ORAL | 0 refills | Status: DC

## 2023-09-30 NOTE — Telephone Encounter (Signed)
Please see the MyChart message reply(ies) for my assessment and plan.    This patient gave consent for this Medical Advice Message and is aware that it may result in a bill to their insurance company, as well as the possibility of receiving a bill for a co-payment or deductible. They are an established patient, but are not seeking medical advice exclusively about a problem treated during an in person or video visit in the last seven days. I did not recommend an in person or video visit within seven days of my reply.    I spent a total of 5 minutes cumulative time within 7 days through MyChart messaging.  Mariem Skolnick, MD   

## 2023-10-01 ENCOUNTER — Encounter (HOSPITAL_COMMUNITY): Payer: Self-pay | Admitting: Psychiatry

## 2023-10-01 ENCOUNTER — Telehealth (HOSPITAL_COMMUNITY): Admitting: Psychiatry

## 2023-10-01 DIAGNOSIS — F410 Panic disorder [episodic paroxysmal anxiety] without agoraphobia: Secondary | ICD-10-CM

## 2023-10-01 DIAGNOSIS — F4310 Post-traumatic stress disorder, unspecified: Secondary | ICD-10-CM

## 2023-10-01 DIAGNOSIS — F411 Generalized anxiety disorder: Secondary | ICD-10-CM

## 2023-10-01 MED ORDER — CLONAZEPAM 0.5 MG PO TABS: 0.5000 mg | ORAL_TABLET | Freq: Two times a day (BID) | ORAL | 1 refills | Status: DC | PRN

## 2023-10-05 ENCOUNTER — Encounter

## 2023-10-05 DIAGNOSIS — Z95810 Presence of automatic (implantable) cardiac defibrillator: Secondary | ICD-10-CM

## 2023-10-05 DIAGNOSIS — I5022 Chronic systolic (congestive) heart failure: Secondary | ICD-10-CM

## 2023-10-05 NOTE — Progress Notes (Signed)
 Remote ICD transmission.

## 2023-10-06 ENCOUNTER — Telehealth: Payer: Self-pay

## 2023-10-06 NOTE — Telephone Encounter (Signed)
 Remote ICM transmission received.  Attempted call to patient regarding ICM remote transmission and no answer.

## 2023-10-06 NOTE — Progress Notes (Signed)
 EPIC Encounter for ICM Monitoring  Patient Name: [name] is a 58 y.o. male Date: 10/06/2023 Primary Care Physican: Lawrance Presume, MD Primary Cardiologist: Crenshaw/[name] Electrophysiologist: Marven Slimmer 01/07/2023 Weight: 280 lbs 02/13/2023 Weight: 297 lbs 03/25/2023 Weight: 297 lbs    06/12/2023 Weight: 289 lbs   07/16/2023 Weight: Not weighing   07/28/2023 Weight: 284 lbs  08/24/2023 Office Weight: 295 lbs    09/28/2023 Weight: 291 lbs       10/06/2023 Weight: unknown lbs          Attempted call to patient and unable to reach.  Transmission results reviewed.      [place] thoracic impedance suggesting possible dryness starting 4/26.   Prescribed dosage:  Torsemide  20 mg take 4 tablet(s) (80 mg total) by mouth twice a day Potassium 20 mEq take 2 tablets (40 mEq total) by mouth every day.    Metolazone  5 mg take 1 tablet by mouth once a week (Nephrologist said he could take it up to twice a week).     Labs: 09/07/2023 Creatinine 2.71, BUN 23, Potassium 4.4, Sodium 139, GFR 26 07/30/2023 Creatinine 2.74, BUN 36, Potassium 4.6, Sodium 139, GFR 26 05/21/2023 Creatinine 2.56, BUN 27, Potassium 4.1, Sodium 138, GFR 28  05/13/2023 Creatinine 2.75, BUN 23, Potassium 3.9, Sodium 138, GFR 26  A complete set of results can be found in Results Review.   Recommendations:  Unable to reach.     Follow-up plan: ICM clinic phone appointment on 10/12/2023 to recheck fluid levels.   91 day device clinic remote transmission 11/18/2023.     EP/Cardiology Office Visits:  Recall 11/02/2023 with Dr Marven Slimmer or [name], PA (6 month).  Recall 12/22/2023 with [name].   Copy of ICM check sent to Dr Marven Slimmer.  3 month ICM trend: 10/05/2023.    12-14 Month ICM trend:     [name], RN 10/06/2023 7:38 AM

## 2023-10-07 ENCOUNTER — Inpatient Hospital Stay (HOSPITAL_COMMUNITY)
Admission: EM | Admit: 2023-10-07 | Discharge: 2023-10-13 | DRG: 683 | Disposition: A | Discharge status: Home / Self Care | Attending: Internal Medicine | Admitting: Internal Medicine

## 2023-10-07 ENCOUNTER — Emergency Department (HOSPITAL_COMMUNITY)

## 2023-10-07 ENCOUNTER — Other Ambulatory Visit: Payer: Self-pay

## 2023-10-07 DIAGNOSIS — Z7901 Long term (current) use of anticoagulants: Secondary | ICD-10-CM

## 2023-10-07 DIAGNOSIS — Z79899 Other long term (current) drug therapy: Secondary | ICD-10-CM

## 2023-10-07 DIAGNOSIS — Z9049 Acquired absence of other specified parts of digestive tract: Secondary | ICD-10-CM

## 2023-10-07 DIAGNOSIS — E785 Hyperlipidemia, unspecified: Secondary | ICD-10-CM | POA: Diagnosis present

## 2023-10-07 DIAGNOSIS — N170 Acute kidney failure with tubular necrosis: Principal | ICD-10-CM | POA: Diagnosis present

## 2023-10-07 DIAGNOSIS — I517 Cardiomegaly: Secondary | ICD-10-CM | POA: Diagnosis not present

## 2023-10-07 DIAGNOSIS — Z91030 Bee allergy status: Secondary | ICD-10-CM

## 2023-10-07 DIAGNOSIS — Z87891 Personal history of nicotine dependence: Secondary | ICD-10-CM

## 2023-10-07 DIAGNOSIS — Z6837 Body mass index (BMI) 37.0-37.9, adult: Secondary | ICD-10-CM

## 2023-10-07 DIAGNOSIS — Z888 Allergy status to other drugs, medicaments and biological substances status: Secondary | ICD-10-CM

## 2023-10-07 DIAGNOSIS — K219 Gastro-esophageal reflux disease without esophagitis: Secondary | ICD-10-CM | POA: Diagnosis present

## 2023-10-07 DIAGNOSIS — Z8249 Family history of ischemic heart disease and other diseases of the circulatory system: Secondary | ICD-10-CM

## 2023-10-07 DIAGNOSIS — R918 Other nonspecific abnormal finding of lung field: Secondary | ICD-10-CM | POA: Diagnosis not present

## 2023-10-07 DIAGNOSIS — E876 Hypokalemia: Principal | ICD-10-CM | POA: Diagnosis present

## 2023-10-07 DIAGNOSIS — Z5986 Financial insecurity: Secondary | ICD-10-CM

## 2023-10-07 DIAGNOSIS — Z8261 Family history of arthritis: Secondary | ICD-10-CM

## 2023-10-07 DIAGNOSIS — E1169 Type 2 diabetes mellitus with other specified complication: Secondary | ICD-10-CM | POA: Diagnosis present

## 2023-10-07 DIAGNOSIS — E861 Hypovolemia: Secondary | ICD-10-CM | POA: Diagnosis present

## 2023-10-07 DIAGNOSIS — E860 Dehydration: Secondary | ICD-10-CM | POA: Diagnosis present

## 2023-10-07 DIAGNOSIS — F32A Depression, unspecified: Secondary | ICD-10-CM | POA: Diagnosis present

## 2023-10-07 DIAGNOSIS — D649 Anemia, unspecified: Secondary | ICD-10-CM | POA: Insufficient documentation

## 2023-10-07 DIAGNOSIS — Z950 Presence of cardiac pacemaker: Secondary | ICD-10-CM | POA: Diagnosis not present

## 2023-10-07 DIAGNOSIS — E1142 Type 2 diabetes mellitus with diabetic polyneuropathy: Secondary | ICD-10-CM | POA: Diagnosis present

## 2023-10-07 DIAGNOSIS — Z87892 Personal history of anaphylaxis: Secondary | ICD-10-CM

## 2023-10-07 DIAGNOSIS — Z8674 Personal history of sudden cardiac arrest: Secondary | ICD-10-CM

## 2023-10-07 DIAGNOSIS — R531 Weakness: Secondary | ICD-10-CM

## 2023-10-07 DIAGNOSIS — E1165 Type 2 diabetes mellitus with hyperglycemia: Secondary | ICD-10-CM | POA: Diagnosis present

## 2023-10-07 DIAGNOSIS — R110 Nausea: Secondary | ICD-10-CM | POA: Diagnosis not present

## 2023-10-07 DIAGNOSIS — G4733 Obstructive sleep apnea (adult) (pediatric): Secondary | ICD-10-CM | POA: Diagnosis present

## 2023-10-07 DIAGNOSIS — I959 Hypotension, unspecified: Secondary | ICD-10-CM | POA: Diagnosis present

## 2023-10-07 DIAGNOSIS — I482 Chronic atrial fibrillation, unspecified: Secondary | ICD-10-CM | POA: Diagnosis present

## 2023-10-07 DIAGNOSIS — T380X5A Adverse effect of glucocorticoids and synthetic analogues, initial encounter: Secondary | ICD-10-CM | POA: Diagnosis present

## 2023-10-07 DIAGNOSIS — I130 Hypertensive heart and chronic kidney disease with heart failure and stage 1 through stage 4 chronic kidney disease, or unspecified chronic kidney disease: Secondary | ICD-10-CM | POA: Diagnosis present

## 2023-10-07 DIAGNOSIS — N184 Chronic kidney disease, stage 4 (severe): Secondary | ICD-10-CM | POA: Diagnosis present

## 2023-10-07 DIAGNOSIS — I5022 Chronic systolic (congestive) heart failure: Secondary | ICD-10-CM | POA: Diagnosis present

## 2023-10-07 DIAGNOSIS — Z87442 Personal history of urinary calculi: Secondary | ICD-10-CM

## 2023-10-07 DIAGNOSIS — R0602 Shortness of breath: Secondary | ICD-10-CM

## 2023-10-07 DIAGNOSIS — J4489 Other specified chronic obstructive pulmonary disease: Secondary | ICD-10-CM | POA: Diagnosis present

## 2023-10-07 DIAGNOSIS — D631 Anemia in chronic kidney disease: Secondary | ICD-10-CM | POA: Diagnosis present

## 2023-10-07 DIAGNOSIS — N179 Acute kidney failure, unspecified: Secondary | ICD-10-CM | POA: Diagnosis not present

## 2023-10-07 DIAGNOSIS — F419 Anxiety disorder, unspecified: Secondary | ICD-10-CM | POA: Diagnosis present

## 2023-10-07 DIAGNOSIS — Z86718 Personal history of other venous thrombosis and embolism: Secondary | ICD-10-CM

## 2023-10-07 DIAGNOSIS — R000 Tachycardia, unspecified: Secondary | ICD-10-CM | POA: Diagnosis not present

## 2023-10-07 DIAGNOSIS — M109 Gout, unspecified: Secondary | ICD-10-CM | POA: Insufficient documentation

## 2023-10-07 DIAGNOSIS — Z833 Family history of diabetes mellitus: Secondary | ICD-10-CM

## 2023-10-07 DIAGNOSIS — N400 Enlarged prostate without lower urinary tract symptoms: Secondary | ICD-10-CM | POA: Diagnosis present

## 2023-10-07 DIAGNOSIS — Z95810 Presence of automatic (implantable) cardiac defibrillator: Secondary | ICD-10-CM

## 2023-10-07 DIAGNOSIS — I428 Other cardiomyopathies: Secondary | ICD-10-CM

## 2023-10-07 DIAGNOSIS — Z7951 Long term (current) use of inhaled steroids: Secondary | ICD-10-CM

## 2023-10-07 DIAGNOSIS — I472 Ventricular tachycardia, unspecified: Secondary | ICD-10-CM | POA: Diagnosis not present

## 2023-10-07 LAB — COMPREHENSIVE METABOLIC PANEL WITH GFR
ALT: 14 U/L (ref 0–44)
AST: 17 U/L (ref 15–41)
Albumin: 3.7 g/dL (ref 3.5–5.0)
Alkaline Phosphatase: 58 U/L (ref 38–126)
Anion gap: 19 — ABNORMAL HIGH (ref 5–15)
BUN: 77 mg/dL — ABNORMAL HIGH (ref 6–20)
CO2: 27 mmol/L (ref 22–32)
Calcium: 10 mg/dL (ref 8.9–10.3)
Chloride: 87 mmol/L — ABNORMAL LOW (ref 98–111)
Creatinine, Ser: 4.56 mg/dL — ABNORMAL HIGH (ref 0.61–1.24)
GFR, Estimated: 14 mL/min — ABNORMAL LOW (ref >60)
Glucose, Bld: 180 mg/dL — ABNORMAL HIGH (ref 70–99)
Potassium: 3.3 mmol/L — ABNORMAL LOW (ref 3.5–5.1)
Sodium: 133 mmol/L — ABNORMAL LOW (ref 135–145)
Total Bilirubin: 0.7 mg/dL (ref 0.0–1.2)
Total Protein: 8.1 g/dL (ref 6.5–8.1)

## 2023-10-07 LAB — I-STAT VENOUS BLOOD GAS, ED
Acid-Base Excess: 7 mmol/L — ABNORMAL HIGH (ref 0.0–2.0)
Bicarbonate: 28.2 mmol/L — ABNORMAL HIGH (ref 20.0–28.0)
Calcium, Ion: 0.88 mmol/L — CL (ref 1.15–1.40)
HCT: 39 % (ref 39.0–52.0)
Hemoglobin: 13.3 g/dL (ref 13.0–17.0)
O2 Saturation: 93 %
Potassium: 4.2 mmol/L (ref 3.5–5.1)
Sodium: 129 mmol/L — ABNORMAL LOW (ref 135–145)
TCO2: 29 mmol/L (ref 22–32)
pCO2, Ven: 29.1 mmHg — ABNORMAL LOW (ref 44–60)
pH, Ven: 7.594 — ABNORMAL HIGH (ref 7.25–7.43)
pO2, Ven: 54 mmHg — ABNORMAL HIGH (ref 32–45)

## 2023-10-07 LAB — CBC WITH DIFFERENTIAL/PLATELET
Abs Immature Granulocytes: 0.45 10*3/uL — ABNORMAL HIGH (ref 0.00–0.07)
Basophils Absolute: 0.1 10*3/uL (ref 0.0–0.1)
Basophils Relative: 1 %
Eosinophils Absolute: 0.1 10*3/uL (ref 0.0–0.5)
Eosinophils Relative: 0 %
HCT: 37.1 % — ABNORMAL LOW (ref 39.0–52.0)
Hemoglobin: 12.4 g/dL — ABNORMAL LOW (ref 13.0–17.0)
Immature Granulocytes: 3 %
Lymphocytes Relative: 15 %
Lymphs Abs: 2 10*3/uL (ref 0.7–4.0)
MCH: 30.2 pg (ref 26.0–34.0)
MCHC: 33.4 g/dL (ref 30.0–36.0)
MCV: 90.5 fL (ref 80.0–100.0)
Monocytes Absolute: 1.6 10*3/uL — ABNORMAL HIGH (ref 0.1–1.0)
Monocytes Relative: 12 %
Neutro Abs: 9.2 10*3/uL — ABNORMAL HIGH (ref 1.7–7.7)
Neutrophils Relative %: 69 %
Platelets: 419 10*3/uL — ABNORMAL HIGH (ref 150–400)
RBC: 4.1 MIL/uL — ABNORMAL LOW (ref 4.22–5.81)
RDW: 14.1 % (ref 11.5–15.5)
WBC: 13.4 10*3/uL — ABNORMAL HIGH (ref 4.0–10.5)
nRBC: 0 % (ref 0.0–0.2)

## 2023-10-07 LAB — BRAIN NATRIURETIC PEPTIDE: B Natriuretic Peptide: 18.6 pg/mL (ref 0.0–100.0)

## 2023-10-07 MED ORDER — SODIUM CHLORIDE 0.9 % IV BOLUS
1000.0000 mL | Freq: Once | INTRAVENOUS | Status: AC
  Administered 2023-10-07: 1000 mL via INTRAVENOUS

## 2023-10-07 NOTE — ED Notes (Signed)
[place] provided to the pt 

## 2023-10-07 NOTE — ED Provider Notes (Signed)
 [place] EMERGENCY DEPARTMENT AT [facility] Provider Note   CSN: [id number] Arrival date & time: 10/07/23  2119     History  Chief Complaint  Patient presents with   Weakness    [name] is a 58 y.o. male.  Patient with past medical history significant for chronic systolic congestive heart failure, ICD, GERD, obesity, gout, type II DM, diabetic polyneuropathy, DVT on Eliquis  presents to the emergency department via EMS complaining of increased weakness.  He states that for the past few days he has been having a severe gout flare.  He has been trying prednisone  with no relief of symptoms.  During the day today he has been increasingly weak and tired.  He denies chest pain, abdominal pain, nausea, vomiting, shortness of breath, fever.  Upon arrival the patient was noted to be hypotensive with a systolic pressure in the 70s.   Weakness      Home Medications Prior to Admission medications   Medication Sig Start Date End Date Taking? Authorizing Provider  acetaminophen  (TYLENOL ) 500 MG tablet Take 1,000 mg by mouth every 8 (eight) hours as needed.   Yes [provider]  amiodarone  (PACERONE ) 200 MG tablet Take 1 tablet (200 mg total) by mouth daily. 08/15/22  Yes Debbie Fails, PA-C  apixaban  (ELIQUIS ) 5 MG TABS tablet Take 1 tablet (5 mg total) by mouth 2 (two) times daily. 06/30/23  Yes Iruku, Praveena, MD  atorvastatin  (LIPITOR) 10 MG tablet Take 1 tablet by mouth once daily Patient taking differently: Take 10 mg by mouth at bedtime. 08/19/23  Yes Lawrance Presume, MD  cetirizine  [facility] ALLERGY RELIEF, CETIRIZINE ,) 10 MG tablet Take 1 tablet (10 mg total) by mouth daily. 09/30/23  Yes Lawrance Presume, MD  cholecalciferol  (VITAMIN D3) 25 MCG (1000 UNIT) tablet Take 1,000 Units by mouth in the morning.   Yes [provider]  clonazePAM  (KLONOPIN ) 0.5 MG tablet Take 1 tablet (0.5 mg total) by mouth 2 (two) times daily as needed for anxiety. Patient  taking differently: Take 0.5 mg by mouth 2 (two) times daily. 10/30/23 10/29/24 Yes Yves Herb, MD  Clotrimazole  1 % OINT Apply to the affected area twice a day Patient taking differently: Apply 1 Application topically 2 (two) times daily as needed. Apply to the affected area twice a day 09/09/23  Yes Newlin, Lavelle Posey, MD  EPINEPHrine  0.3 mg/0.3 mL IJ SOAJ injection Inject 0.3 mg into the muscle as needed. 01/30/22  Yes Rochester Chuck, MD  famotidine  (PEPCID ) 20 MG tablet TAKE 1 TABLET BY MOUTH 1 TO 2 TIMES DAILY AS NEEDED Patient taking differently: Take 20 mg by mouth 2 (two) times daily. TAKE 1 TABLET BY MOUTH 1 TO 2 TIMES DAILY AS NEEDED 07/14/23  Yes Ambs, Jeanmarie Millet, FNP  fluticasone  (FLONASE ) 50 MCG/ACT nasal spray Use 1-2 sprays in each nostril once a day as needed for nasal congestion. 08/07/23  Yes Collins Dean, NP  Fluticasone -Umeclidin-Vilant (TRELEGY ELLIPTA ) 100-62.5-25 MCG/ACT AEPB Inhale 1 puff into the lungs daily. 06/29/23  Yes Aleck Hurdle, MD  gabapentin  (NEURONTIN ) 300 MG capsule Take 1 capsule (300 mg total) by mouth at bedtime. 05/25/23  Yes Lawrance Presume, MD  insulin  aspart (NOVOLOG  FLEXPEN) 100 UNIT/ML FlexPen Inject 30-38 Units into the skin 3 (three) times daily before meals. 08/13/23  Yes Emilie Harden, MD  insulin  glargine (LANTUS  SOLOSTAR) 100 UNIT/ML Solostar Pen Inject 45 Units into the skin daily. Patient taking differently: Inject 30 Units  into the skin at bedtime. 08/13/23  Yes Emilie Harden, MD  methocarbamol  (ROBAXIN ) 500 MG tablet Take 1 tablet (500 mg total) by mouth daily. Patient taking differently: Take 500 mg by mouth at bedtime. 04/24/23  Yes Lawrance Presume, MD  metolazone  (ZAROXOLYN ) 5 MG tablet Take 1 tablet (5 mg total) by mouth once a week. 06/15/23 08/08/24 Yes [name], Aditya, DO  metoprolol  succinate (TOPROL -XL) 50 MG 24 hr tablet Take 1 tablet (50 mg total) by mouth daily. 08/22/22  Yes [name], Aditya, DO  nystatin   (MYCOSTATIN /NYSTOP ) powder APPLY 1 APPLICATION TOPICALLY 3 (THREE) TIMES DAILY. 09/30/23  Yes Lawrance Presume, MD  Polyethyl Glycol-Propyl Glycol (SYSTANE) 0.4-0.3 % SOLN Place 1 drop into both eyes daily as needed (dry eyes).   Yes [provider]  potassium chloride  SA (KLOR-CON  M) 20 MEQ tablet Take 2 tablets by mouth once daily 09/25/23  Yes [name], Aditya, DO  predniSONE  (DELTASONE ) 20 MG tablet 1 tab PO daily x 4 days then 1/2 tab daily x 4 days 09/30/23  Yes Lawrance Presume, MD  sacubitril -valsartan  (ENTRESTO ) 49-51 MG Take 1 tablet by mouth 2 (two) times daily. 05/21/23  Yes [name], Aditya, DO  sertraline  (ZOLOFT ) 50 MG tablet Take 1.5 tablets (75 mg total) by mouth in the morning. 08/27/23 08/26/24 Yes Yves Herb, MD  SYMBICORT  160-4.5 MCG/ACT inhaler Inhale 2 puffs into the lungs daily. 08/28/23  Yes [provider]  tamsulosin  (FLOMAX ) 0.4 MG CAPS capsule Take 2 capsules by mouth once daily 08/19/23  Yes Lawrance Presume, MD  torsemide  (DEMADEX ) 20 MG tablet Take 4 tablets (80 mg total) by mouth daily. 08/24/23  Yes [name], Aditya, DO      Allergies    Bee venom and Trulicity  [dulaglutide ]    Review of Systems   Review of Systems  Neurological:  Positive for weakness.    Physical Exam Updated Vital Signs BP (!) 101/55   Pulse 89   Temp 98 F (36.7 C) (Oral)   Resp (!) 25   Ht 6\' 1"  (1.854 m)   Wt 127.9 kg   SpO2 93%   BMI 37.21 kg/m  Physical Exam Vitals and nursing note reviewed.  Constitutional:      General: He is not in acute distress.    Appearance: He is well-developed. He is obese.  HENT:     Head: Normocephalic and atraumatic.  Eyes:     Conjunctiva/sclera: Conjunctivae normal.  Cardiovascular:     Rate and Rhythm: Normal rate and regular rhythm.  Pulmonary:     Effort: Pulmonary effort is normal. No respiratory distress.     Breath sounds: Wheezing present.  Abdominal:     Palpations: Abdomen is soft.     Tenderness:  There is no abdominal tenderness.  Musculoskeletal:        General: No swelling.     Cervical back: Neck supple.  Skin:    General: Skin is warm and dry.     Capillary Refill: Capillary refill takes less than 2 seconds.  Neurological:     Mental Status: He is alert.  Psychiatric:        Mood and Affect: Mood normal.     ED Results / Procedures / Treatments   Labs (all labs ordered are listed, but only abnormal results are displayed) Labs Reviewed  COMPREHENSIVE METABOLIC PANEL WITH GFR - Abnormal; Notable for the following components:      Result Value   Sodium 133 (*)    Potassium  3.3 (*)    Chloride 87 (*)    Glucose, Bld 180 (*)    BUN 77 (*)    Creatinine, Ser 4.56 (*)    GFR, Estimated 14 (*)    Anion gap 19 (*)    All other components within normal limits  CBC WITH DIFFERENTIAL/PLATELET - Abnormal; Notable for the following components:   WBC 13.4 (*)    RBC 4.10 (*)    Hemoglobin 12.4 (*)    HCT 37.1 (*)    Platelets 419 (*)    Neutro Abs 9.2 (*)    Monocytes Absolute 1.6 (*)    Abs Immature Granulocytes 0.45 (*)    All other components within normal limits  I-STAT VENOUS BLOOD GAS, ED - Abnormal; Notable for the following components:   pH, Ven 7.594 (*)    pCO2, Ven 29.1 (*)    pO2, Ven 54 (*)    Bicarbonate 28.2 (*)    Acid-Base Excess 7.0 (*)    Sodium 129 (*)    Calcium , Ion 0.88 (*)    All other components within normal limits  BRAIN NATRIURETIC PEPTIDE    EKG None  Radiology DG Chest Portable 1 View Result Date: 10/07/2023 CLINICAL DATA:  Weakness EXAM: PORTABLE CHEST 1 VIEW COMPARISON:  09/07/2023 FINDINGS: Cardiac pacemaker. Shallow inspiration. Cardiac enlargement. No vascular congestion. Atelectasis or infiltration demonstrated in the right lung base. No pleural effusion or pneumothorax. IMPRESSION: Cardiac enlargement. Shallow inspiration with atelectasis or infiltration in the right base. Electronically Signed   By: Boyce Byes M.D.   On:  10/07/2023 22:25    Procedures .Critical Care  Performed by: Elisa Guest, PA-C Authorized by: Elisa Guest, PA-C   Critical care provider statement:    Critical care time (minutes):  30   Critical care time was exclusive of:  Separately billable procedures and treating other patients   Critical care was necessary to treat or prevent imminent or life-threatening deterioration of the following conditions:  Dehydration (BP<75 systolic upon arrival)   Critical care was time spent personally by me on the following activities:  Development of treatment plan with patient or surrogate, discussions with consultants, evaluation of patient's response to treatment, examination of patient, ordering and review of laboratory studies, ordering and review of radiographic studies, ordering and performing treatments and interventions, pulse oximetry, re-evaluation of patient's condition and review of old charts   Care discussed with: admitting provider       Medications Ordered in ED Medications  sodium chloride  0.9 % bolus 1,000 mL (1,000 mLs Intravenous New Bag/Given 10/07/23 2305)    ED Course/ Medical Decision Making/ A&P                                 Medical Decision Making Amount and/or Complexity of Data Reviewed Labs: ordered. Radiology: ordered.   This patient presents to the ED for concern of weakness, this involves an extensive number of treatment options, and is a complaint that carries with it a high risk of complications and morbidity.  The differential diagnosis includes dehydration, dysrhythmia, infection, metabolic abnormality, others   Co morbidities that complicate the patient evaluation  ICD, CHF, type II DM   Additional history obtained:  Additional history obtained from EMS External records from outside source obtained and reviewed including internal medicine notes   Lab Tests:  I Ordered, and personally interpreted labs.  The pertinent results include:  Venous  pH 7.594, creatinine 4.56 (2.71 16-month ago), BUN 77 (23 1 month ago), anion gap of 19   Imaging Studies ordered:  I ordered imaging studies including chest x-ray I independently visualized and interpreted imaging which showed  Cardiac enlargement. Shallow inspiration with atelectasis or  infiltration in the right base.   I agree with the radiologist interpretation   Cardiac Monitoring: / EKG:  The patient was maintained on a cardiac monitor.  I personally viewed and interpreted the cardiac monitored which showed an underlying rhythm of: Sinus rhythm   Problem List / ED Course / Critical interventions / Medication management   I ordered medication including saline bolus for fluid resuscitation Reevaluation of the patient after these medicines showed that the patient improved I have reviewed the patients home medicines and have made adjustments as needed   Consultations Obtained:  I requested consultation with the medicine service, Dr.Kakrakandy,  and discussed lab and imaging findings as well as pertinent plan - they recommend: admission   Social Determinants of Health:  Patient has Medicaid for his primary health insurance type, is a former smoker   Test / Admission - Considered:  Patient with significant kidney dysfunction on lab work.  He was initially very hypotensive with pressures in the 70 systolic range and a MAP of approximately 60.  Blood pressure improved significantly to the 120 systolic range with 1 L saline bolus.  Kidney function showing almost 2 point increase in creatinine over the past month and over 3 time increase in BUN.  Patient endorses drinking lots of fluid, states his urine output may be slightly decreased from normal for him.  Unclear cause of this kidney function decrease at this time. He does take torsemide , but majority of other medications do no appear to be worrisome from a nephrology standpoint other than possibly entresto .  Patient needs  admission to the hospital for further evaluation and management, including likely further fluids and further renal evaluation.         Final Clinical Impression(s) / ED Diagnoses Final diagnoses:  AKI (acute kidney injury) (HCC)  Dehydration  Weakness    Rx / DC Orders ED Discharge Orders     None         Delories Fetter 10/08/23 0248    Alissa April, MD 10/08/23 [phone]

## 2023-10-07 NOTE — ED Triage Notes (Signed)
 Patient brought in by EMS from home. Patient has c/o not feeling well starting today but got worst tonight. Patient also states his gout has been bothering him all week. Denies being around anybody sick. Denies any cough or congestion. No acute distress noted. Patient alert and oriented x4. EMS stated patient O2 was 96% on RA however they placed him on O2 for comfort.

## 2023-10-08 ENCOUNTER — Encounter (HOSPITAL_COMMUNITY): Payer: Self-pay | Admitting: Internal Medicine

## 2023-10-08 ENCOUNTER — Inpatient Hospital Stay (HOSPITAL_COMMUNITY)

## 2023-10-08 ENCOUNTER — Other Ambulatory Visit: Payer: Self-pay

## 2023-10-08 DIAGNOSIS — R918 Other nonspecific abnormal finding of lung field: Secondary | ICD-10-CM | POA: Diagnosis not present

## 2023-10-08 DIAGNOSIS — M109 Gout, unspecified: Secondary | ICD-10-CM | POA: Insufficient documentation

## 2023-10-08 DIAGNOSIS — I5043 Acute on chronic combined systolic (congestive) and diastolic (congestive) heart failure: Secondary | ICD-10-CM | POA: Diagnosis not present

## 2023-10-08 DIAGNOSIS — F419 Anxiety disorder, unspecified: Secondary | ICD-10-CM | POA: Diagnosis not present

## 2023-10-08 DIAGNOSIS — I472 Ventricular tachycardia, unspecified: Secondary | ICD-10-CM | POA: Diagnosis not present

## 2023-10-08 DIAGNOSIS — K219 Gastro-esophageal reflux disease without esophagitis: Secondary | ICD-10-CM | POA: Diagnosis not present

## 2023-10-08 DIAGNOSIS — R531 Weakness: Secondary | ICD-10-CM | POA: Diagnosis not present

## 2023-10-08 DIAGNOSIS — E1169 Type 2 diabetes mellitus with other specified complication: Secondary | ICD-10-CM | POA: Diagnosis not present

## 2023-10-08 DIAGNOSIS — I959 Hypotension, unspecified: Secondary | ICD-10-CM

## 2023-10-08 DIAGNOSIS — I9589 Other hypotension: Secondary | ICD-10-CM | POA: Diagnosis not present

## 2023-10-08 DIAGNOSIS — I5022 Chronic systolic (congestive) heart failure: Secondary | ICD-10-CM | POA: Diagnosis not present

## 2023-10-08 DIAGNOSIS — E876 Hypokalemia: Secondary | ICD-10-CM | POA: Diagnosis not present

## 2023-10-08 DIAGNOSIS — I428 Other cardiomyopathies: Secondary | ICD-10-CM | POA: Diagnosis not present

## 2023-10-08 DIAGNOSIS — F32A Depression, unspecified: Secondary | ICD-10-CM | POA: Diagnosis not present

## 2023-10-08 DIAGNOSIS — N400 Enlarged prostate without lower urinary tract symptoms: Secondary | ICD-10-CM | POA: Diagnosis not present

## 2023-10-08 DIAGNOSIS — I482 Chronic atrial fibrillation, unspecified: Secondary | ICD-10-CM | POA: Diagnosis not present

## 2023-10-08 DIAGNOSIS — I130 Hypertensive heart and chronic kidney disease with heart failure and stage 1 through stage 4 chronic kidney disease, or unspecified chronic kidney disease: Secondary | ICD-10-CM | POA: Diagnosis not present

## 2023-10-08 DIAGNOSIS — Z6837 Body mass index (BMI) 37.0-37.9, adult: Secondary | ICD-10-CM | POA: Diagnosis not present

## 2023-10-08 DIAGNOSIS — N179 Acute kidney failure, unspecified: Secondary | ICD-10-CM | POA: Diagnosis present

## 2023-10-08 DIAGNOSIS — Z950 Presence of cardiac pacemaker: Secondary | ICD-10-CM | POA: Diagnosis not present

## 2023-10-08 DIAGNOSIS — D631 Anemia in chronic kidney disease: Secondary | ICD-10-CM | POA: Diagnosis not present

## 2023-10-08 DIAGNOSIS — E1142 Type 2 diabetes mellitus with diabetic polyneuropathy: Secondary | ICD-10-CM | POA: Diagnosis not present

## 2023-10-08 DIAGNOSIS — E785 Hyperlipidemia, unspecified: Secondary | ICD-10-CM

## 2023-10-08 DIAGNOSIS — J4489 Other specified chronic obstructive pulmonary disease: Secondary | ICD-10-CM | POA: Diagnosis not present

## 2023-10-08 DIAGNOSIS — D649 Anemia, unspecified: Secondary | ICD-10-CM | POA: Insufficient documentation

## 2023-10-08 DIAGNOSIS — T380X5A Adverse effect of glucocorticoids and synthetic analogues, initial encounter: Secondary | ICD-10-CM | POA: Diagnosis not present

## 2023-10-08 DIAGNOSIS — E861 Hypovolemia: Secondary | ICD-10-CM | POA: Diagnosis not present

## 2023-10-08 DIAGNOSIS — N184 Chronic kidney disease, stage 4 (severe): Secondary | ICD-10-CM | POA: Diagnosis not present

## 2023-10-08 DIAGNOSIS — E860 Dehydration: Secondary | ICD-10-CM | POA: Diagnosis not present

## 2023-10-08 DIAGNOSIS — I517 Cardiomegaly: Secondary | ICD-10-CM | POA: Diagnosis not present

## 2023-10-08 DIAGNOSIS — N170 Acute kidney failure with tubular necrosis: Secondary | ICD-10-CM | POA: Diagnosis not present

## 2023-10-08 DIAGNOSIS — E1165 Type 2 diabetes mellitus with hyperglycemia: Secondary | ICD-10-CM | POA: Diagnosis not present

## 2023-10-08 LAB — CBC WITH DIFFERENTIAL/PLATELET
Abs Immature Granulocytes: 0.22 10*3/uL — ABNORMAL HIGH (ref 0.00–0.07)
Basophils Absolute: 0.1 10*3/uL (ref 0.0–0.1)
Basophils Relative: 1 %
Eosinophils Absolute: 0.1 10*3/uL (ref 0.0–0.5)
Eosinophils Relative: 1 %
HCT: 34.3 % — ABNORMAL LOW (ref 39.0–52.0)
Hemoglobin: 11.6 g/dL — ABNORMAL LOW (ref 13.0–17.0)
Immature Granulocytes: 2 %
Lymphocytes Relative: 21 %
Lymphs Abs: 2.2 10*3/uL (ref 0.7–4.0)
MCH: 30.3 pg (ref 26.0–34.0)
MCHC: 33.8 g/dL (ref 30.0–36.0)
MCV: 89.6 fL (ref 80.0–100.0)
Monocytes Absolute: 1.2 10*3/uL — ABNORMAL HIGH (ref 0.1–1.0)
Monocytes Relative: 12 %
Neutro Abs: 6.5 10*3/uL (ref 1.7–7.7)
Neutrophils Relative %: 63 %
Platelets: 374 10*3/uL (ref 150–400)
RBC: 3.83 MIL/uL — ABNORMAL LOW (ref 4.22–5.81)
RDW: 14.1 % (ref 11.5–15.5)
WBC: 10.4 10*3/uL (ref 4.0–10.5)
nRBC: 0 % (ref 0.0–0.2)

## 2023-10-08 LAB — HEPATIC FUNCTION PANEL
ALT: 11 U/L (ref 0–44)
AST: 13 U/L — ABNORMAL LOW (ref 15–41)
Albumin: 3.3 g/dL — ABNORMAL LOW (ref 3.5–5.0)
Alkaline Phosphatase: 53 U/L (ref 38–126)
Bilirubin, Direct: <0.1 mg/dL (ref 0.0–0.2)
Total Bilirubin: 0.9 mg/dL (ref 0.0–1.2)
Total Protein: 7.4 g/dL (ref 6.5–8.1)

## 2023-10-08 LAB — BASIC METABOLIC PANEL WITH GFR
Anion gap: 17 — ABNORMAL HIGH (ref 5–15)
BUN: 83 mg/dL — ABNORMAL HIGH (ref 6–20)
CO2: 26 mmol/L (ref 22–32)
Calcium: 9.1 mg/dL (ref 8.9–10.3)
Chloride: 88 mmol/L — ABNORMAL LOW (ref 98–111)
Creatinine, Ser: 4.58 mg/dL — ABNORMAL HIGH (ref 0.61–1.24)
GFR, Estimated: 14 mL/min — ABNORMAL LOW (ref >60)
Glucose, Bld: 279 mg/dL — ABNORMAL HIGH (ref 70–99)
Potassium: 2.8 mmol/L — ABNORMAL LOW (ref 3.5–5.1)
Sodium: 131 mmol/L — ABNORMAL LOW (ref 135–145)

## 2023-10-08 LAB — PROCALCITONIN: Procalcitonin: 0.44 ng/mL

## 2023-10-08 LAB — URINALYSIS, ROUTINE W REFLEX MICROSCOPIC
Bilirubin Urine: NEGATIVE
Glucose, UA: 50 mg/dL — AB
Hgb urine dipstick: NEGATIVE
Ketones, ur: NEGATIVE mg/dL
Leukocytes,Ua: NEGATIVE
Nitrite: NEGATIVE
Protein, ur: NEGATIVE mg/dL
Specific Gravity, Urine: 1.009 (ref 1.005–1.030)
pH: 5 (ref 5.0–8.0)

## 2023-10-08 LAB — TROPONIN I (HIGH SENSITIVITY): Troponin I (High Sensitivity): 17 ng/L (ref <18)

## 2023-10-08 LAB — OSMOLALITY, URINE: Osmolality, Ur: 327 mosm/kg (ref 300–900)

## 2023-10-08 LAB — URIC ACID: Uric Acid, Serum: 16 mg/dL — ABNORMAL HIGH (ref 3.7–8.6)

## 2023-10-08 LAB — HEMOGLOBIN A1C
Hgb A1c MFr Bld: 9.3 % — ABNORMAL HIGH (ref 4.8–5.6)
Mean Plasma Glucose: 220.21 mg/dL

## 2023-10-08 LAB — SEDIMENTATION RATE: Sed Rate: 69 mm/h — ABNORMAL HIGH (ref 0–16)

## 2023-10-08 LAB — GLUCOSE, CAPILLARY
Glucose-Capillary: 334 mg/dL — ABNORMAL HIGH (ref 70–99)
Glucose-Capillary: 278 mg/dL — ABNORMAL HIGH (ref 70–99)
Glucose-Capillary: 268 mg/dL — ABNORMAL HIGH (ref 70–99)
Glucose-Capillary: 461 mg/dL — ABNORMAL HIGH (ref 70–99)

## 2023-10-08 LAB — CK: Total CK: 164 U/L (ref 49–397)

## 2023-10-08 LAB — MAGNESIUM: Magnesium: 2.2 mg/dL (ref 1.7–2.4)

## 2023-10-08 LAB — T4, FREE: Free T4: 1.47 ng/dL — ABNORMAL HIGH (ref 0.61–1.12)

## 2023-10-08 LAB — C-REACTIVE PROTEIN: CRP: 7.3 mg/dL — ABNORMAL HIGH (ref <1.0)

## 2023-10-08 LAB — SODIUM, URINE, RANDOM: Sodium, Ur: 45 mmol/L

## 2023-10-08 LAB — TSH: TSH: 1.019 u[IU]/mL (ref 0.350–4.500)

## 2023-10-08 LAB — HIV ANTIBODY (ROUTINE TESTING W REFLEX): HIV Screen 4th Generation wRfx: NONREACTIVE

## 2023-10-08 LAB — LACTIC ACID, PLASMA: Lactic Acid, Venous: 2 mmol/L (ref 0.5–1.9)

## 2023-10-08 LAB — CREATININE, URINE, RANDOM: Creatinine, Urine: 97 mg/dL

## 2023-10-08 MED ORDER — INSULIN ASPART 100 UNIT/ML IJ SOLN
0.0000 [IU] | Freq: Three times a day (TID) | INTRAMUSCULAR | Status: DC
  Administered 2023-10-08 – 2023-10-09 (×2): 11 [IU] via SUBCUTANEOUS
  Administered 2023-10-09: 20 [IU] via SUBCUTANEOUS
  Administered 2023-10-10 (×2): 7 [IU] via SUBCUTANEOUS
  Administered 2023-10-10: 20 [IU] via SUBCUTANEOUS
  Administered 2023-10-11: 4 [IU] via SUBCUTANEOUS
  Administered 2023-10-11: 7 [IU] via SUBCUTANEOUS
  Administered 2023-10-11: 15 [IU] via SUBCUTANEOUS
  Administered 2023-10-12: 4 [IU] via SUBCUTANEOUS
  Administered 2023-10-12: 15 [IU] via SUBCUTANEOUS
  Administered 2023-10-13: 3 [IU] via SUBCUTANEOUS

## 2023-10-08 MED ORDER — APIXABAN 5 MG PO TABS: 5.0000 mg | ORAL_TABLET | Freq: Two times a day (BID) | ORAL | Status: DC

## 2023-10-08 MED ORDER — CLONAZEPAM 0.5 MG PO TABS: 0.5000 mg | ORAL_TABLET | Freq: Two times a day (BID) | ORAL | Status: DC | PRN

## 2023-10-08 MED ORDER — INSULIN ASPART 100 UNIT/ML IJ SOLN
0.0000 [IU] | Freq: Every day | INTRAMUSCULAR | Status: DC
  Administered 2023-10-08 – 2023-10-10 (×3): 5 [IU] via SUBCUTANEOUS
  Administered 2023-10-12: 2 [IU] via SUBCUTANEOUS

## 2023-10-08 MED ORDER — FLUTICASONE FUROATE-VILANTEROL 200-25 MCG/ACT IN AEPB
1.0000 | INHALATION_SPRAY | Freq: Every day | RESPIRATORY_TRACT | Status: DC
  Administered 2023-10-08 – 2023-10-13 (×6): 1 via RESPIRATORY_TRACT
  Filled 2023-10-08 (×2): qty 28

## 2023-10-08 MED ORDER — SODIUM CHLORIDE 0.9 % IV SOLN
2.0000 g | INTRAVENOUS | Status: DC
  Administered 2023-10-08 – 2023-10-10 (×3): 2 g via INTRAVENOUS
  Filled 2023-10-08 (×3): qty 20

## 2023-10-08 MED ORDER — ORAL CARE MOUTH RINSE: 15.0000 mL | OROMUCOSAL | Status: DC | PRN

## 2023-10-08 MED ORDER — TAMSULOSIN HCL 0.4 MG PO CAPS
0.8000 mg | ORAL_CAPSULE | Freq: Every day | ORAL | Status: DC
  Administered 2023-10-08 – 2023-10-13 (×6): 0.8 mg via ORAL
  Filled 2023-10-08 (×6): qty 2

## 2023-10-08 MED ORDER — SERTRALINE HCL 50 MG PO TABS
75.0000 mg | ORAL_TABLET | Freq: Every day | ORAL | Status: DC
  Administered 2023-10-08 – 2023-10-13 (×6): 75 mg via ORAL
  Filled 2023-10-08 (×6): qty 2

## 2023-10-08 MED ORDER — GABAPENTIN 100 MG PO CAPS
100.0000 mg | ORAL_CAPSULE | Freq: Every day | ORAL | Status: DC
  Administered 2023-10-08 – 2023-10-10 (×3): 100 mg via ORAL
  Filled 2023-10-08 (×3): qty 1

## 2023-10-08 MED ORDER — FAMOTIDINE 20 MG PO TABS
20.0000 mg | ORAL_TABLET | Freq: Every day | ORAL | Status: DC
  Administered 2023-10-08 – 2023-10-11 (×4): 20 mg via ORAL
  Filled 2023-10-08 (×5): qty 1

## 2023-10-08 MED ORDER — PREDNISONE 20 MG PO TABS
10.0000 mg | ORAL_TABLET | Freq: Every day | ORAL | Status: DC
  Administered 2023-10-08: 10 mg via ORAL
  Filled 2023-10-08: qty 1

## 2023-10-08 MED ORDER — INSULIN ASPART 100 UNIT/ML IJ SOLN
4.0000 [IU] | Freq: Three times a day (TID) | INTRAMUSCULAR | Status: DC
  Administered 2023-10-08 – 2023-10-09 (×3): 4 [IU] via SUBCUTANEOUS

## 2023-10-08 MED ORDER — ACETAMINOPHEN 325 MG PO TABS: 650.0000 mg | ORAL_TABLET | Freq: Once | ORAL | Status: DC

## 2023-10-08 MED ORDER — INSULIN ASPART 100 UNIT/ML IJ SOLN
0.0000 [IU] | Freq: Three times a day (TID) | INTRAMUSCULAR | Status: DC
  Administered 2023-10-08: 3 [IU] via SUBCUTANEOUS
  Administered 2023-10-08: 4 [IU] via SUBCUTANEOUS

## 2023-10-08 MED ORDER — INSULIN GLARGINE-YFGN 100 UNIT/ML ~~LOC~~ SOLN
30.0000 [IU] | Freq: Every day | SUBCUTANEOUS | Status: DC
  Administered 2023-10-08: 30 [IU] via SUBCUTANEOUS
  Filled 2023-10-08 (×2): qty 0.3

## 2023-10-08 MED ORDER — ACETAMINOPHEN 325 MG PO TABS
650.0000 mg | ORAL_TABLET | Freq: Four times a day (QID) | ORAL | Status: DC | PRN
  Filled 2023-10-08: qty 2

## 2023-10-08 MED ORDER — POTASSIUM CHLORIDE CRYS ER 20 MEQ PO TBCR
40.0000 meq | EXTENDED_RELEASE_TABLET | Freq: Once | ORAL | Status: AC
  Administered 2023-10-08: 40 meq via ORAL
  Filled 2023-10-08: qty 2

## 2023-10-08 MED ORDER — SODIUM CHLORIDE 0.9 % IV SOLN
100.0000 mg | Freq: Two times a day (BID) | INTRAVENOUS | Status: DC
  Filled 2023-10-08: qty 100

## 2023-10-08 MED ORDER — ATORVASTATIN CALCIUM 10 MG PO TABS
10.0000 mg | ORAL_TABLET | Freq: Every day | ORAL | Status: DC
  Administered 2023-10-08 – 2023-10-12 (×5): 10 mg via ORAL
  Filled 2023-10-08 (×5): qty 1

## 2023-10-08 MED ORDER — ACETAMINOPHEN 650 MG RE SUPP: 650.0000 mg | Freq: Four times a day (QID) | RECTAL | Status: DC | PRN

## 2023-10-08 MED ORDER — AMIODARONE HCL 200 MG PO TABS
400.0000 mg | ORAL_TABLET | Freq: Every day | ORAL | Status: DC
  Administered 2023-10-09 – 2023-10-13 (×5): 400 mg via ORAL
  Filled 2023-10-08 (×5): qty 2

## 2023-10-08 MED ORDER — AMIODARONE HCL 200 MG PO TABS
200.0000 mg | ORAL_TABLET | Freq: Every day | ORAL | Status: DC
  Administered 2023-10-08: 200 mg via ORAL
  Filled 2023-10-08: qty 1

## 2023-10-08 MED ORDER — INSULIN GLARGINE-YFGN 100 UNIT/ML ~~LOC~~ SOLN
15.0000 [IU] | Freq: Every day | SUBCUTANEOUS | Status: DC
  Filled 2023-10-08: qty 0.15

## 2023-10-08 MED ORDER — METHYLPREDNISOLONE SODIUM SUCC 40 MG IJ SOLR
40.0000 mg | Freq: Every day | INTRAMUSCULAR | Status: DC
  Administered 2023-10-08 – 2023-10-09 (×2): 40 mg via INTRAVENOUS
  Filled 2023-10-08 (×2): qty 1

## 2023-10-08 NOTE — Progress Notes (Signed)
 [facility] PLAN OF CARE NOTE Patient: [name] ZOX:[id number]   PCP: Lawrance Presume, MD DOB: 03-31-1966   DOA: 10/07/2023   DOS: 10/08/2023    Patient was admitted by my colleague earlier on 10/08/2023. I have reviewed the H&P as well as assessment and plan and agree with the same. Important changes in the plan are listed below.  Plan of care: Principal Problem:   Hypotension Active Problems:   Chronic systolic congestive heart failure (HCC)   Type 2 diabetes mellitus with hyperlipidemia (HCC)   Nonischemic cardiomyopathy (HCC)   AKI (acute kidney injury) (HCC)   ARF (acute renal failure) (HCC)   Acute gout   Anemia   Acute renal failure (ARF) (HCC) Blood pressure is now improving. AKI unchanged. Change CT scan to ultrasound renal. Multiple NSVT episodes on telemetry.  Cardiology consulted.  Appreciate consult. Gout.  Will treat with IV Solu-Medrol  and monitor response. Hyperglycemia in the setting of type II DM secondary to steroids. Continue sliding scale insulin . Severe hypokalemia.  Replaced.  Level of care: Telemetry Cardiac continue telemetry.  Author: Charlean Congress, MD  [facility] 10/08/2023 6:42 PM   If 7PM-7AM, please contact night-coverage at [email or web address]

## 2023-10-08 NOTE — TOC Initial Note (Signed)
 Transition of Care [facility]) - Initial/Assessment Note    Patient Details  Name: [name] MRN: [id number] Date of Birth: Dec 28, 1965  [facility]) CM/SW Contact:    Ernst Heap Phone Number: [phone] 10/08/2023, 3:08 PM  Clinical Narrative:  3:01 PM- HF CSW attempted to meet with patient at bedside. Patient was not in the room at the time. CSW will follow up patient at a mote appropriate time.   TOC will continue following.                      Patient Goals and CMS Choice            Expected Discharge Plan and Services                                              Prior Living Arrangements/Services                       Activities of Daily Living   ADL Screening (condition at time of admission) Independently performs ADLs?: Yes (appropriate for developmental age) Is the patient deaf or have difficulty hearing?: No Does the patient have difficulty seeing, even when wearing glasses/contacts?: No Does the patient have difficulty concentrating, remembering, or making decisions?: No  Permission Sought/Granted                  Emotional Assessment              Admission diagnosis:  Dehydration [E86.0] ARF (acute renal failure) (HCC) [N17.9] Weakness [R53.1] AKI (acute kidney injury) (HCC) [N17.9] Acute renal failure (ARF) (HCC) [N17.9] Patient Active Problem List   Diagnosis Date Noted   ARF (acute renal failure) (HCC) 10/08/2023   Acute gout 10/08/2023   Anemia 10/08/2023   Acute renal failure (ARF) (HCC) 10/08/2023   Hypotension 09/07/2023   Acute deep vein thrombosis (DVT) of left upper extremity (HCC) 04/24/2023   Cellulitis of perineum 04/02/2023   Heart failure (HCC) 02/28/2023   Class 3 obesity 02/28/2023   SOB (shortness of breath) 02/27/2023   Loud snoring 10/28/2022   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   Hyperlipidemia associated with type 2 diabetes mellitus (HCC) 09/18/2022    PTSD (post-traumatic stress disorder) 07/10/2022   Panic attack 06/17/2022   Passive suicidal ideations 06/17/2022   History of ventricular tachycardia 06/14/2022   Essential hypertension 06/14/2022   Stage 3b chronic kidney disease (HCC) 05/15/2022   Acute gout due to renal impairment involving left ankle 05/07/2022   Acute gout due to renal impairment involving right knee 05/07/2022   Hyponatremia 05/07/2022   Diarrhea 05/06/2022   Hyperglycemia 05/06/2022   Cellulitis of buttock 05/06/2022   Acute renal failure superimposed on stage 3b chronic kidney disease (HCC) 05/06/2022   Hypokalemia 05/05/2022   Rectal abscess 05/05/2022   Left buttock abscess 05/05/2022   Cardiac arrest (HCC) 05/03/2022   AKI (acute kidney injury) (HCC) 05/03/2022   Tachycardia 05/03/2022   CKD (chronic kidney disease) stage 2, GFR 60-89 ml/min 10/24/2019   Acute non-recurrent frontal sinusitis 06/27/2019   COVID-19 virus infection 06/27/2019   Glaucoma suspect 12/22/2018   Seasonal and perennial allergic rhinoconjunctivitis 10/25/2018   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   Urticaria 05/03/2018   S/P inguinal hernia repair 03/01/2018   Unilateral inguinal hernia without obstruction or  gangrene 07/24/2017   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   Hypersomnia 02/28/2016   Abnormal PFT 02/24/2016   Dyspnea 01/28/2016   Diabetic polyneuropathy associated with type 2 diabetes mellitus (HCC) 12/18/2015   Lumbar back pain 12/18/2015   Type 2 diabetes mellitus with hyperlipidemia (HCC) 09/17/2015   Left median nerve neuropathy 04/16/2015   Lesion of right median nerve at forearm 04/16/2015   ICD (implantable cardioverter-defibrillator) in place 03/28/2014   Chronic systolic congestive heart failure (HCC) 03/28/2014   Nonischemic cardiomyopathy (HCC) 11/09/2013   Syncope 10/20/2013   Chest pain 07/28/2013   RENAL CALCULUS 05/13/2009   Obesity 05/08/2009   Former heavy  cigarette smoker (20-39 per day) 05/08/2009   HOARSENESS 05/08/2009   GAD (generalized anxiety disorder) 02/28/2008   ASTHMATIC BRONCHITIS, ACUTE 02/28/2008   GERD 02/28/2008   IRRITABLE BOWEL SYNDROME 02/28/2008   PCP:  Lawrance Presume, MD Pharmacy:   [id number] - [place], [place] - [address] [place] [address] Phone: [phone] Fax: [phone]  [id number] [place],  - [address] AT [facility] [address] [place] [address] Phone: [phone] Fax: [phone]  [facility] - [place], [place] - [address] [place] [address] Phone: [phone] Fax: [phone]     Social Drivers of Health (SDOH) Social History: SDOH Screenings   Food Insecurity: No Food Insecurity (10/08/2023)  Housing: Low Risk  (10/08/2023)  Transportation Needs: No Transportation Needs (10/08/2023)  Utilities: Not At Risk (10/08/2023)  Alcohol Screen: Low Risk  (05/25/2023)  Depression (PHQ2-9): Low Risk  (08/07/2023)  Recent Concern: Depression (PHQ2-9) - Medium Risk (05/25/2023)  Financial Resource Strain: Low Risk  (05/25/2023)  Recent Concern: Financial Resource Strain - High Risk (03/24/2023)  Physical Activity: Inactive (05/25/2023)  Social Connections: Moderately Integrated (10/08/2023)  Stress: Stress Concern Present (05/25/2023)  Tobacco Use: Medium Risk (10/08/2023)  Health Literacy: Adequate Health Literacy (05/25/2023)   SDOH Interventions:     Readmission Risk Interventions     No data to display

## 2023-10-08 NOTE — Consult Note (Addendum)
 Advanced Heart Failure Team Consult Note   Primary Physician: Lawrance Presume, MD Cardiologist:  Alexandria Angel, MD  Reason for Consultation: Hypotension, chronic HFrEF, VT  HPI:    [name] is seen today for evaluation of hypotension, chronic HFrEF, VT at the request of Dr. [facility] Lavender with [facility]. 58 y.o. male with history of HFrEF/NICM s/p ICD, CKD IIIb, DM II, obesity, COPD   Admitted with VT in 11/23 2/2 electrolyte derangements in setting of right gluteal abscess. Had recurrent shocks from his device. R/LHC with normal coronaries, low filling pressures and normal CO. He later had placement of right atrial lead to help prevent V pacing.   Echo 03/24 EF 20-25%, moderate LVH, RV okay.   Admitted 10/24 with groin cellulitis. Treated with IV antibiotics. He is now off Farxiga .   Readmitted 11/24 with extensive left upper extremity DVT. He was started on Eliquis . Echo 11/24: EF 25-30%.  Echo 03/25: EF up to 40-45%, RV okay  Seen in ED 03/31 with RSV infection. A week later given course of doxycycline  d/t persistent symptoms.  Remote interrogation 04/28 suggested volume depletion.  Presented to the ED yesterday with complaints of increasing fatigue/weakness and hypotension. SBP 70s. Only new medication recently is prednisone  started a week prior for gout flare.  Reports he had been eating and drinking less after recent illness but still felt he was taking in enough. Labs significant for: Scr 4.5 (baseline 2.5-2.7), BUN 77, Cl 87, K 3.3, Na 133. He was given 1L IVF and admitted for further workup/management. HF medications held.  Advanced Heart Failure asked to see to assist with management. Today's labs are pending. Note multiple runs of NSVT on telemetry.    Home Medications Prior to Admission medications   Medication Sig Start Date End Date Taking? Authorizing Provider  acetaminophen  (TYLENOL ) 500 MG tablet Take 1,000 mg by mouth every 8 (eight) hours as needed.   Yes  [provider]  amiodarone  (PACERONE ) 200 MG tablet Take 1 tablet (200 mg total) by mouth daily. 08/15/22  Yes Debbie Fails, PA-C  apixaban  (ELIQUIS ) 5 MG TABS tablet Take 1 tablet (5 mg total) by mouth 2 (two) times daily. 06/30/23  Yes Iruku, Praveena, MD  atorvastatin  (LIPITOR) 10 MG tablet Take 1 tablet by mouth once daily Patient taking differently: Take 10 mg by mouth at bedtime. 08/19/23  Yes Lawrance Presume, MD  cetirizine  [facility] 2 ALLERGY RELIEF, CETIRIZINE ,) 10 MG tablet Take 1 tablet (10 mg total) by mouth daily. 09/30/23  Yes Lawrance Presume, MD  cholecalciferol  (VITAMIN D3) 25 MCG (1000 UNIT) tablet Take 1,000 Units by mouth in the morning.   Yes [provider]  clonazePAM  (KLONOPIN ) 0.5 MG tablet Take 1 tablet (0.5 mg total) by mouth 2 (two) times daily as needed for anxiety. Patient taking differently: Take 0.5 mg by mouth 2 (two) times daily. 10/30/23 10/29/24 Yes Yves Herb, MD  Clotrimazole  1 % OINT Apply to the affected area twice a day Patient taking differently: Apply 1 Application topically 2 (two) times daily as needed. Apply to the affected area twice a day 09/09/23  Yes Newlin, Enobong, MD  EPINEPHrine  0.3 mg/0.3 mL IJ SOAJ injection Inject 0.3 mg into the muscle as needed. 01/30/22  Yes Rochester Chuck, MD  famotidine  (PEPCID ) 20 MG tablet TAKE 1 TABLET BY MOUTH 1 TO 2 TIMES DAILY AS NEEDED Patient taking differently: Take 20 mg by mouth 2 (two) times daily. TAKE 1 TABLET BY MOUTH  1 TO 2 TIMES DAILY AS NEEDED 07/14/23  Yes Ambs, Jeanmarie Millet, FNP  fluticasone  (FLONASE ) 50 MCG/ACT nasal spray Use 1-2 sprays in each nostril once a day as needed for nasal congestion. 08/07/23  Yes Collins Dean, NP  gabapentin  (NEURONTIN ) 300 MG capsule Take 1 capsule (300 mg total) by mouth at bedtime. 05/25/23  Yes Lawrance Presume, MD  insulin  aspart (NOVOLOG  FLEXPEN) 100 UNIT/ML FlexPen Inject 30-38 Units into the skin 3 (three) times daily before meals. 08/13/23  Yes  Emilie Harden, MD  insulin  glargine (LANTUS  SOLOSTAR) 100 UNIT/ML Solostar Pen Inject 45 Units into the skin daily. Patient taking differently: Inject 30 Units into the skin at bedtime. 08/13/23  Yes Emilie Harden, MD  methocarbamol  (ROBAXIN ) 500 MG tablet Take 1 tablet (500 mg total) by mouth daily. Patient taking differently: Take 500 mg by mouth at bedtime. 04/24/23  Yes Lawrance Presume, MD  metolazone  (ZAROXOLYN ) 5 MG tablet Take 1 tablet (5 mg total) by mouth once a week. 06/15/23 08/08/24 Yes [name], Aditya, DO  metoprolol  succinate (TOPROL -XL) 50 MG 24 hr tablet Take 1 tablet (50 mg total) by mouth daily. 08/22/22  Yes [name], Aditya, DO  nystatin  (MYCOSTATIN /NYSTOP ) powder APPLY 1 APPLICATION TOPICALLY 3 (THREE) TIMES DAILY. 09/30/23  Yes Lawrance Presume, MD  Polyethyl Glycol-Propyl Glycol (SYSTANE) 0.4-0.3 % SOLN Place 1 drop into both eyes daily as needed (dry eyes).   Yes [provider]  potassium chloride  SA (KLOR-CON  M) 20 MEQ tablet Take 2 tablets by mouth once daily 09/25/23  Yes [name], Aditya, DO  predniSONE  (DELTASONE ) 20 MG tablet 1 tab PO daily x 4 days then 1/2 tab daily x 4 days 09/30/23  Yes Lawrance Presume, MD  sacubitril -valsartan  (ENTRESTO ) 49-51 MG Take 1 tablet by mouth 2 (two) times daily. 05/21/23  Yes [name], Aditya, DO  sertraline  (ZOLOFT ) 50 MG tablet Take 1.5 tablets (75 mg total) by mouth in the morning. 08/27/23 08/26/24 Yes Yves Herb, MD  SYMBICORT  160-4.5 MCG/ACT inhaler Inhale 2 puffs into the lungs daily. 08/28/23  Yes [provider]  tamsulosin  (FLOMAX ) 0.4 MG CAPS capsule Take 2 capsules by mouth once daily 08/19/23  Yes Lawrance Presume, MD  torsemide  (DEMADEX ) 20 MG tablet Take 4 tablets (80 mg total) by mouth daily. 08/24/23  Yes Alwin Baars, DO    Past Medical History: Past Medical History:  Diagnosis Date   AICD (automatic cardioverter/defibrillator) present    Chronic bronchitis (HCC)    "get it  most q yr" (12/23/2013)   Chronic systolic (congestive) heart failure (HCC)    Fracture of left humerus    a. 07/2013.   GERD (gastroesophageal reflux disease)    not at present time   Heart murmur    "born w/it"    History of renal calculi    Hypertension    Kidney stones    NICM (nonischemic cardiomyopathy) (HCC)    a. 07/2013 Echo: EF 20-25%, diff HK, Gr2 DD, mild MR, mod dil LA/RA. 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    Shockable heart rhythm detected by automated external defibrillator    Syncope    Type II diabetes mellitus (HCC) 2006    Past Surgical History: Past Surgical History:  Procedure Laterality Date   CARDIAC CATHETERIZATION  08/10/13   CHOLECYSTECTOMY  ~ 2010   IMPLANTABLE CARDIOVERTER DEFIBRILLATOR IMPLANT  12/23/2013   STJ Fortify ICD  implanted by Dr Nunzio Belch for cardiomyopathy and syncope   IMPLANTABLE CARDIOVERTER DEFIBRILLATOR IMPLANT N/A 12/23/2013   Procedure: IMPLANTABLE CARDIOVERTER DEFIBRILLATOR IMPLANT;  Surgeon: Ellaree Gunther, MD;  Location: [facility] CATH LAB;  Service: Cardiovascular;  Laterality: N/A;   INGUINAL HERNIA REPAIR Left 03/01/2018   Procedure: OPEN REPAIR OF LEFT INGUINAL HERNIA WITH MESH;  Surgeon: Aldean Hummingbird, MD;  Location: [facility] ORS;  Service: General;  Laterality: Left;   LEAD REVISION/REPAIR N/A 05/15/2022   Procedure: LEAD REVISION/REPAIR;  Surgeon: Boyce Byes, MD;  Location: [facility] INVASIVE CV LAB;  Service: Cardiovascular;  Laterality: N/A;   LEFT HEART CATHETERIZATION WITH CORONARY ANGIOGRAM N/A 08/10/2013   Procedure: LEFT HEART CATHETERIZATION WITH CORONARY ANGIOGRAM;  Surgeon: Lucendia Rusk, MD;  Location: [facility] CATH LAB;  Service: Cardiovascular;  Laterality: N/A;   ORIF HUMERUS FRACTURE Left 08/12/2013   Procedure: OPEN REDUCTION INTERNAL FIXATION (ORIF) HUMERAL SHAFT FRACTURE;  Surgeon: Timothy Ford, MD;  Location: [facility] OR;  Service: Orthopedics;  Laterality:  Left;  Open Reduction Internal Fixation Left Humerus   RIGHT/LEFT HEART CATH AND CORONARY ANGIOGRAPHY N/A 05/12/2022   Procedure: RIGHT/LEFT HEART CATH AND CORONARY ANGIOGRAPHY;  Surgeon: Alwin Baars, DO;  Location: [facility] INVASIVE CV LAB;  Service: Cardiovascular;  Laterality: N/A;   URETEROSCOPY     "laser for kidney stones"    Family History: Family History  Problem Relation Age of Onset   Diabetes Father    Arthritis Father    Hypertension Father    Diabetes Mother    Arthritis Mother    Hypertension Mother    Hypertension Brother     Social History: Social History   Socioeconomic History   Marital status: Single    Spouse name: Not on file   Number of children: Not on file   Years of education: Not on file   Highest education level: Not on file  Occupational History   Occupation: Disabled  Tobacco Use   Smoking status: Former    Current packs/day: 0.00    Average packs/day: 1 pack/day for 28.0 years (28.0 ttl pk-yrs)    Types: Cigarettes    Start date: 06/03/1986    Quit date: 06/03/2014    Years since quitting: 9.3   Smokeless tobacco: Never  Vaping Use   Vaping status: Never Used  Substance and Sexual Activity   Alcohol use: Not Currently   Drug use: Yes    Types: Marijuana    Comment: occasionally;   Sexual activity: Yes  Other Topics Concern   Not on file  Social History Narrative   Pt lives with his brother    Social Drivers of Health   Financial Resource Strain: Low Risk  (05/25/2023)   Overall Financial Resource Strain (CARDIA)    Difficulty of Paying Living Expenses: Not hard at all  Recent Concern: Financial Resource Strain - High Risk (03/24/2023)   Overall Financial Resource Strain (CARDIA)    Difficulty of Paying Living Expenses: Hard  Food Insecurity: No Food Insecurity (10/08/2023)   Hunger Vital Sign    Worried About Running Out of Food in the Last Year: Never true    Ran Out of Food in the Last Year: Never true  Transportation Needs:  No Transportation Needs (10/08/2023)   PRAPARE - Administrator, Civil Service (Medical): No    Lack of Transportation (Non-Medical): No  Physical Activity: Inactive (05/25/2023)   Exercise Vital Sign    Days of Exercise per Week: 0 days    Minutes  of Exercise per Session: 0 min  Stress: Stress Concern Present (05/25/2023)   [employer] of Occupational Health - Occupational Stress Questionnaire    Feeling of Stress : Rather much  Social Connections: Moderately Integrated (10/08/2023)   Social Connection and Isolation Panel [NHANES]    Frequency of Communication with Friends and Family: More than three times a week    Frequency of Social Gatherings with Friends and Family: More than three times a week    Attends Religious Services: More than 4 times per year    Active Member of [employer] or Organizations: Yes    Attends Banker Meetings: 1 to 4 times per year    Marital Status: Never married    Allergies:  Allergies  Allergen Reactions   Bee Venom Anaphylaxis   Trulicity  [Dulaglutide ] Anaphylaxis and Diarrhea    Extra dose of Trulicity  caused cardiac arrest. Patient experience onset of diarrhea with using this medication - cleared after med stopped.    Objective:    Vital Signs:   Temp:  [97.9 F (36.6 C)-98.7 F (37.1 C)] 98.5 F (36.9 C) (05/01 1150) Pulse Rate:  [72-89] 78 (05/01 1150) Resp:  [12-25] 19 (05/01 1150) BP: (73-119)/(51-82) 91/55 (05/01 1150) SpO2:  [92 %-98 %] 94 % (05/01 1150) Weight:  [127.9 kg-129.3 kg] 129.3 kg (05/01 0546) Last BM Date : 10/07/23  Weight change: Filed Weights   10/07/23 2128 10/08/23 0546  Weight: 127.9 kg 129.3 kg    Intake/Output:   Intake/Output Summary (Last 24 hours) at 10/08/2023 1313 Last data filed at 10/08/2023 1000 Gross per 24 hour  Intake 1120 ml  Output 400 ml  Net 720 ml      Physical Exam    General:  Fatigued appearing Neck: no JVD Cor: Regular rate & rhythm. No rubs, gallops or  murmurs. Lungs: diminished Abdomen: obese, soft, nontender, nondistended.  Extremities: legs are warm, no edema, b/l great toes are warm and tender with palpation Neuro: alert & orientedx3. Affect pleasant   Telemetry   SR 80s, 5 PVCs/min, runs of NSVT  Labs   Basic Metabolic Panel: Recent Labs  Lab 10/07/23 2201 10/07/23 2225  NA 133* 129*  K 3.3* 4.2  CL 87*  --   CO2 27  --   GLUCOSE 180*  --   BUN 77*  --   CREATININE 4.56*  --   CALCIUM  10.0  --     Liver Function Tests: Recent Labs  Lab 10/07/23 2201  AST 17  ALT 14  ALKPHOS 58  BILITOT 0.7  PROT 8.1  ALBUMIN 3.7   No results for input(s): "LIPASE", "AMYLASE" in the last 168 hours. No results for input(s): "AMMONIA" in the last 168 hours.  CBC: Recent Labs  Lab 10/07/23 2201 10/07/23 2225 10/08/23 1152  WBC 13.4*  --  10.4  NEUTROABS 9.2*  --  6.5  HGB 12.4* 13.3 11.6*  HCT 37.1* 39.0 34.3*  MCV 90.5  --  89.6  PLT 419*  --  374    Cardiac Enzymes: No results for input(s): "CKTOTAL", "CKMB", "CKMBINDEX", "TROPONINI" in the last 168 hours.  BNP: BNP (last 3 results) Recent Labs    05/21/23 1205 07/30/23 1645 10/07/23 2201  BNP 29.3 27.9 18.6    ProBNP (last 3 results) No results for input(s): "PROBNP" in the last 8760 hours.   CBG: Recent Labs  Lab 10/08/23 0557 10/08/23 1145  GLUCAP 334* 278*    Coagulation Studies: No results for input(s): "LABPROT", "  INR" in the last 72 hours.   Imaging   DG Chest Portable 1 View Result Date: 10/07/2023 CLINICAL DATA:  Weakness EXAM: PORTABLE CHEST 1 VIEW COMPARISON:  09/07/2023 FINDINGS: Cardiac pacemaker. Shallow inspiration. Cardiac enlargement. No vascular congestion. Atelectasis or infiltration demonstrated in the right lung base. No pleural effusion or pneumothorax. IMPRESSION: Cardiac enlargement. Shallow inspiration with atelectasis or infiltration in the right base. Electronically Signed   By: Boyce Byes M.D.   On:  10/07/2023 22:25     Medications:     Current Medications:  amiodarone   200 mg Oral Daily   atorvastatin   10 mg Oral QHS   famotidine   20 mg Oral Daily   fluticasone  furoate-vilanterol  1 puff Inhalation Daily   gabapentin   100 mg Oral QHS   insulin  aspart  0-20 Units Subcutaneous TID WC   insulin  aspart  0-5 Units Subcutaneous QHS   insulin  aspart  4 Units Subcutaneous TID WC   insulin  glargine-yfgn  30 Units Subcutaneous QHS   methylPREDNISolone  (SOLU-MEDROL ) injection  40 mg Intravenous Daily   sertraline   75 mg Oral Daily   tamsulosin   0.8 mg Oral Daily    Infusions:  cefTRIAXone  (ROCEPHIN )  IV 2 g (10/08/23 1240)      Patient Profile   58 y.o. male with history of HFrEF/NICM s/p ICD, VT, DM II, CKD 3b, COPD, obesity. Admitted with hypotension and AKI.  Assessment/Plan   Hypotension -Suspect d/t volume depletion. Volume status appeared low on remote device interrogation 04/28 -Notes decreased po intake after RSV infection/possible COPDE a month ago.  -BP improved after IVF in ED -Possible infiltrates on CXR. Procalcitonin, ESR/CRP and BC X 2 pending. Treating empirically with ceftriaxone  -Agree with holding HF GDMT  Chronic HFrEF -Nonischemic cardiomyopathy -EF previously 25-30% range, up to 40-45% on most recent echo 03/25 -NYHA III -Appears hypovolemic. Hold diuretics. Received IVF in ED -Hold home Entresto , Toprol  XL, Torsemide  and Metolazone  -No SGLT2i with hx of groin infections -Spiro had been stopped in past by Nephrology  VT -Multiple runs on telemetry, longest 44 beats -Will interrogate device to see if any therapies over the last couple of days. Has history of multiple ICD shocks in setting of acute illness/metabolic derangements -Keep K > 4 and Mag > 2 -Continu amiodarone  200 mg daily  DM II -Uncontrolled A1c > 9 -Avoid SGLT2i with hx of groin infections -Per primary  Gout flare -? Burst of prednisone  40 mg daily X 3 days. No improvement  with low dose prednisone  outpatient. Would need to watch glucose -Hesitant to use colchicine  with AKI  AKI -Scr 4.5 last night, baseline 2.5-2.7 -Suspect 2/2 volume depletion, received IV fluid resuscitation -Home diuretics on hold -Today's labs and renal US  are pending  Length of Stay: 0  Madissen Wyse N, PA-C  10/08/2023, 1:13 PM    Advanced Heart Failure Team Pager [phone] (M-F; 7a - 5p)  Please contact [facility] for night-coverage after hours (4p -7a ) and weekends on [email or web address]

## 2023-10-08 NOTE — Progress Notes (Signed)
 Ccmd called pt had a 44 beat run of vtach pt sitting in bed with lab at bedside asymptomatic Dr Lydia Sams made aware EKG and additional labs ordered awaiting further lab results  EKG obtained and placed on chart Dr Lydia Sams made aware and pt was in NSR. Dr Audery Blazing on unit noted he was listed in pt's chart so made him aware of vtach as well he stated he would review the chart, awaiting further orders am meds administered early as pt is on amiodarone . Instructions for urine collection given and abx held until urine is obtained

## 2023-10-08 NOTE — Progress Notes (Signed)
 Lab was unable to obtain blood cultures and other labs this am called phlebotomy another phlebotomist is upt to draw

## 2023-10-08 NOTE — H&P (Signed)
 History and Physical    EMRIC NICOLO ZOX:[id number] DOB: 1966/05/20 DOA: 10/07/2023  Patient coming from: Home.  Chief Complaint: Weakness.  HPI: [name] is a 58 y.o. male with history of nonischemic cardiomyopathy status post AICD placement, atrial fibrillation, diabetes mellitus type 2, chronic kidney disease stage III, hypertension, chronic anemia, gout was brought to the ER after patient states that he was not feeling well and felt weak yesterday.  Patient states over the last 1 week he has been having gout attack of both feet and has been on prednisone  for the last 4 days.  Denies any nausea vomiting diarrhea chest pain shortness of breath productive cough fever or chills.  Has not had any new medications other than prednisone .  Has not noticed any blood in the stools or dark stools.  About a month ago patient had RSV and also bronchitis was placed on doxycycline .  Recently also had some groin fungal infection.  ED Course: In the ER patient's blood pressure systolic was in the 70s with creatinine worsening from 2.7 on September 07, 2023 and it is around 4.5.  Patient states he still makes urine.  Chest x-ray showing possible infiltrates.  Patient was given 1 L fluid bolus following which blood pressure improved to 110s systolic.  On exam patient has mild tenderness of both feet particularly on the first metatarsal phalangeal joint.  Patient admitted for hypotension and acute on chronic kidney disease stage III.  Review of Systems: As per HPI, rest all negative.   Past Medical History:  Diagnosis Date   AICD (automatic cardioverter/defibrillator) present    Chronic bronchitis (HCC)    "get it most q yr" (12/23/2013)   Chronic systolic (congestive) heart failure (HCC)    Fracture of left humerus    a. 07/2013.   GERD (gastroesophageal reflux disease)    not at present time   Heart murmur    "born w/it"    History of renal calculi    Hypertension    Kidney stones    NICM  (nonischemic cardiomyopathy) (HCC)    a. 07/2013 Echo: EF 20-25%, diff HK, Gr2 DD, mild MR, mod dil LA/RA. 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    Shockable heart rhythm detected by automated external defibrillator    Syncope    Type II diabetes mellitus (HCC) 2006    Past Surgical History:  Procedure Laterality Date   CARDIAC CATHETERIZATION  08/10/13   CHOLECYSTECTOMY  ~ 2010   IMPLANTABLE CARDIOVERTER DEFIBRILLATOR IMPLANT  12/23/2013   STJ Fortify ICD implanted by Dr Nunzio Belch for cardiomyopathy and syncope   IMPLANTABLE CARDIOVERTER DEFIBRILLATOR IMPLANT N/A 12/23/2013   Procedure: IMPLANTABLE CARDIOVERTER DEFIBRILLATOR IMPLANT;  Surgeon: Ellaree Gunther, MD;  Location: [facility] CATH LAB;  Service: Cardiovascular;  Laterality: N/A;   INGUINAL HERNIA REPAIR Left 03/01/2018   Procedure: OPEN REPAIR OF LEFT INGUINAL HERNIA WITH MESH;  Surgeon: Aldean Hummingbird, MD;  Location: [facility] ORS;  Service: General;  Laterality: Left;   LEAD REVISION/REPAIR N/A 05/15/2022   Procedure: LEAD REVISION/REPAIR;  Surgeon: Boyce Byes, MD;  Location: [facility] INVASIVE CV LAB;  Service: Cardiovascular;  Laterality: N/A;   LEFT HEART CATHETERIZATION WITH CORONARY ANGIOGRAM N/A 08/10/2013   Procedure: LEFT HEART CATHETERIZATION WITH CORONARY ANGIOGRAM;  Surgeon: Lucendia Rusk, MD;  Location: [facility] CATH LAB;  Service: Cardiovascular;  Laterality: N/A;   ORIF HUMERUS FRACTURE Left 08/12/2013   Procedure:  OPEN REDUCTION INTERNAL FIXATION (ORIF) HUMERAL SHAFT FRACTURE;  Surgeon: Timothy Ford, MD;  Location: [facility] OR;  Service: Orthopedics;  Laterality: Left;  Open Reduction Internal Fixation Left Humerus   RIGHT/LEFT HEART CATH AND CORONARY ANGIOGRAPHY N/A 05/12/2022   Procedure: RIGHT/LEFT HEART CATH AND CORONARY ANGIOGRAPHY;  Surgeon: Alwin Baars, DO;  Location: [facility] INVASIVE CV LAB;  Service: Cardiovascular;  Laterality: N/A;   URETEROSCOPY     "laser  for kidney stones"     reports that he quit smoking about 9 years ago. His smoking use included cigarettes. He started smoking about 37 years ago. He has a 28 pack-year smoking history. He has never used smokeless tobacco. He reports that he does not currently use alcohol. He reports current drug use. Drug: Marijuana.  Allergies  Allergen Reactions   Bee Venom Anaphylaxis   Trulicity  [Dulaglutide ] Anaphylaxis and Diarrhea    Extra dose of Trulicity  caused cardiac arrest. Patient experience onset of diarrhea with using this medication - cleared after med stopped.    Family History  Problem Relation Age of Onset   Diabetes Father    Arthritis Father    Hypertension Father    Diabetes Mother    Arthritis Mother    Hypertension Mother    Hypertension Brother     Prior to Admission medications   Medication Sig Start Date End Date Taking? Authorizing Provider  acetaminophen  (TYLENOL ) 500 MG tablet Take 1,000 mg by mouth every 8 (eight) hours as needed.   Yes [provider]  amiodarone  (PACERONE ) 200 MG tablet Take 1 tablet (200 mg total) by mouth daily. 08/15/22  Yes Debbie Fails, PA-C  apixaban  (ELIQUIS ) 5 MG TABS tablet Take 1 tablet (5 mg total) by mouth 2 (two) times daily. 06/30/23  Yes Iruku, Praveena, MD  atorvastatin  (LIPITOR) 10 MG tablet Take 1 tablet by mouth once daily Patient taking differently: Take 10 mg by mouth at bedtime. 08/19/23  Yes Lawrance Presume, MD  cetirizine  [facility] ALLERGY RELIEF, CETIRIZINE ,) 10 MG tablet Take 1 tablet (10 mg total) by mouth daily. 09/30/23  Yes Lawrance Presume, MD  cholecalciferol  (VITAMIN D3) 25 MCG (1000 UNIT) tablet Take 1,000 Units by mouth in the morning.   Yes [provider]  clonazePAM  (KLONOPIN ) 0.5 MG tablet Take 1 tablet (0.5 mg total) by mouth 2 (two) times daily as needed for anxiety. Patient taking differently: Take 0.5 mg by mouth 2 (two) times daily. 10/30/23 10/29/24 Yes Yves Herb, MD  Clotrimazole  1  % OINT Apply to the affected area twice a day Patient taking differently: Apply 1 Application topically 2 (two) times daily as needed. Apply to the affected area twice a day 09/09/23  Yes Newlin, Enobong, MD  EPINEPHrine  0.3 mg/0.3 mL IJ SOAJ injection Inject 0.3 mg into the muscle as needed. 01/30/22  Yes Rochester Chuck, MD  famotidine  (PEPCID ) 20 MG tablet TAKE 1 TABLET BY MOUTH 1 TO 2 TIMES DAILY AS NEEDED Patient taking differently: Take 20 mg by mouth 2 (two) times daily. TAKE 1 TABLET BY MOUTH 1 TO 2 TIMES DAILY AS NEEDED 07/14/23  Yes Ambs, Jeanmarie Millet, FNP  fluticasone  (FLONASE ) 50 MCG/ACT nasal spray Use 1-2 sprays in each nostril once a day as needed for nasal congestion. 08/07/23  Yes Fleming, Zelda W, NP  gabapentin  (NEURONTIN ) 300 MG capsule Take 1 capsule (300 mg total) by mouth at bedtime. 05/25/23  Yes Lawrance Presume, MD  insulin  aspart (NOVOLOG  FLEXPEN) 100 UNIT/ML FlexPen Inject  30-38 Units into the skin 3 (three) times daily before meals. 08/13/23  Yes Emilie Harden, MD  insulin  glargine (LANTUS  SOLOSTAR) 100 UNIT/ML Solostar Pen Inject 45 Units into the skin daily. Patient taking differently: Inject 30 Units into the skin at bedtime. 08/13/23  Yes Emilie Harden, MD  methocarbamol  (ROBAXIN ) 500 MG tablet Take 1 tablet (500 mg total) by mouth daily. Patient taking differently: Take 500 mg by mouth at bedtime. 04/24/23  Yes Lawrance Presume, MD  metolazone  (ZAROXOLYN ) 5 MG tablet Take 1 tablet (5 mg total) by mouth once a week. 06/15/23 08/08/24 Yes [name], Aditya, DO  metoprolol  succinate (TOPROL -XL) 50 MG 24 hr tablet Take 1 tablet (50 mg total) by mouth daily. 08/22/22  Yes [name], Aditya, DO  nystatin  (MYCOSTATIN /NYSTOP ) powder APPLY 1 APPLICATION TOPICALLY 3 (THREE) TIMES DAILY. 09/30/23  Yes Lawrance Presume, MD  Polyethyl Glycol-Propyl Glycol (SYSTANE) 0.4-0.3 % SOLN Place 1 drop into both eyes daily as needed (dry eyes).   Yes [provider]  potassium  chloride SA (KLOR-CON  M) 20 MEQ tablet Take 2 tablets by mouth once daily 09/25/23  Yes [name], Aditya, DO  predniSONE  (DELTASONE ) 20 MG tablet 1 tab PO daily x 4 days then 1/2 tab daily x 4 days 09/30/23  Yes Lawrance Presume, MD  sacubitril -valsartan  (ENTRESTO ) 49-51 MG Take 1 tablet by mouth 2 (two) times daily. 05/21/23  Yes [name], Aditya, DO  sertraline  (ZOLOFT ) 50 MG tablet Take 1.5 tablets (75 mg total) by mouth in the morning. 08/27/23 08/26/24 Yes Yves Herb, MD  SYMBICORT  160-4.5 MCG/ACT inhaler Inhale 2 puffs into the lungs daily. 08/28/23  Yes [provider]  tamsulosin  (FLOMAX ) 0.4 MG CAPS capsule Take 2 capsules by mouth once daily 08/19/23  Yes Lawrance Presume, MD  torsemide  (DEMADEX ) 20 MG tablet Take 4 tablets (80 mg total) by mouth daily. 08/24/23  Yes [name], Zenda Highman, DO    Physical Exam: Constitutional: Moderately built and nourished. Vitals:   10/08/23 0230 10/08/23 0300 10/08/23 0303 10/08/23 0546  BP: 96/82 113/61  (!) 111/59  Pulse: 86 86  80  Resp: 14 (!) 23  12  Temp:   97.9 F (36.6 C) 98.7 F (37.1 C)  TempSrc:   Oral Oral  SpO2: 98% 94%  97%  Weight:    129.3 kg  Height:    6\' 1"  (1.854 m)   Eyes: Anicteric no pallor. ENMT: No discharge from the ears/nose or mouth. Neck: No mass felt.  No neck rigidity. Respiratory: No rhonchi or crepitations. Cardiovascular: S1-S2 heard. Abdomen: Soft nontender bowel sound present. Musculoskeletal: Tenderness of the both feet around the first metatarsal phalangeal joint. Skin: No rash. Neurologic: Alert awake oriented time place and person.  Moves all extremities. Psychiatric: Appears normal.  Normal affect.   Labs on Admission: I have personally reviewed following labs and imaging studies  CBC: Recent Labs  Lab 10/07/23 2201 10/07/23 2225  WBC 13.4*  --   NEUTROABS 9.2*  --   HGB 12.4* 13.3  HCT 37.1* 39.0  MCV 90.5  --   PLT 419*  --    Basic Metabolic Panel: Recent Labs  Lab  10/07/23 2201 10/07/23 2225  NA 133* 129*  K 3.3* 4.2  CL 87*  --   CO2 27  --   GLUCOSE 180*  --   BUN 77*  --   CREATININE 4.56*  --   CALCIUM  10.0  --    GFR: Estimated Creatinine Clearance: 24.9 mL/min (A) (  by C-G formula based on SCr of 4.56 mg/dL (H)). Liver Function Tests: Recent Labs  Lab 10/07/23 2201  AST 17  ALT 14  ALKPHOS 58  BILITOT 0.7  PROT 8.1  ALBUMIN 3.7   No results for input(s): "LIPASE", "AMYLASE" in the last 168 hours. No results for input(s): "AMMONIA" in the last 168 hours. Coagulation Profile: No results for input(s): "INR", "PROTIME" in the last 168 hours. Cardiac Enzymes: No results for input(s): "CKTOTAL", "CKMB", "CKMBINDEX", "TROPONINI" in the last 168 hours. BNP (last 3 results) No results for input(s): "PROBNP" in the last 8760 hours. HbA1C: No results for input(s): "HGBA1C" in the last 72 hours. CBG: Recent Labs  Lab 10/08/23 0557  GLUCAP 334*   Lipid Profile: No results for input(s): "CHOL", "HDL", "LDLCALC", "TRIG", "CHOLHDL", "LDLDIRECT" in the last 72 hours. Thyroid  Function Tests: No results for input(s): "TSH", "T4TOTAL", "FREET4", "T3FREE", "THYROIDAB" in the last 72 hours. Anemia Panel: No results for input(s): "VITAMINB12", "FOLATE", "FERRITIN", "TIBC", "IRON", "RETICCTPCT" in the last 72 hours. Urine analysis:    Component Value Date/Time   COLORURINE COLORLESS (A) 04/02/2023 1552   APPEARANCEUR CLEAR 04/02/2023 1552   APPEARANCEUR Clear 07/17/2020 1655   LABSPEC 1.008 04/02/2023 1552   PHURINE 5.0 04/02/2023 1552   GLUCOSEU >1,000 (A) 04/02/2023 1552   HGBUR NEGATIVE 04/02/2023 1552   BILIRUBINUR NEGATIVE 04/02/2023 1552   BILIRUBINUR negative 01/30/2022 1705   BILIRUBINUR Negative 07/17/2020 1655   KETONESUR NEGATIVE 04/02/2023 1552   PROTEINUR NEGATIVE 04/02/2023 1552   UROBILINOGEN 0.2 01/30/2022 1705   UROBILINOGEN 1.0 06/20/2013 1032   NITRITE NEGATIVE 04/02/2023 1552   LEUKOCYTESUR NEGATIVE 04/02/2023  1552   Sepsis Labs: @LABRCNTIP (procalcitonin:4,lacticidven:4) )No results found for this or any previous visit (from the past 240 hours).   Radiological Exams on Admission: DG Chest Portable 1 View Result Date: 10/07/2023 CLINICAL DATA:  Weakness EXAM: PORTABLE CHEST 1 VIEW COMPARISON:  09/07/2023 FINDINGS: Cardiac pacemaker. Shallow inspiration. Cardiac enlargement. No vascular congestion. Atelectasis or infiltration demonstrated in the right lung base. No pleural effusion or pneumothorax. IMPRESSION: Cardiac enlargement. Shallow inspiration with atelectasis or infiltration in the right base. Electronically Signed   By: Boyce Byes M.D.   On: 10/07/2023 22:25    EKG: Independently reviewed.  Normal  sinus rhythm.  Assessment/Plan Principal Problem:   Hypotension Active Problems:   Chronic systolic congestive heart failure (HCC)   Type 2 diabetes mellitus with hyperlipidemia (HCC)   Nonischemic cardiomyopathy (HCC)   ARF (acute renal failure) (HCC)   Acute gout   Anemia   Acute renal failure (ARF) (HCC)    Hypotension -     cause not clear.  Patient denies any nausea vomiting diarrhea or any chest pain fever or chills.  Blood pressure improved with fluids.  Will continue to hold patient's Entresto  torsemide  metolazone  metoprolol  for now.  Will get blood cultures check procalcitonin lactic acid troponin and since patient is having some infiltrates in the lung and also leukocytosis we will keep patient on ceftriaxone  and doxycycline  for now.  Patient does not appear to be in any cardiogenic or septic shock. Acute on chronic kidney disease stage III with hyponatremia creatinine has increased from baseline of around 2.7 last month and it is around 4.5.  Will check CT renal study to rule out obstruction and also UA.  Hold Entresto  metolazone  and torsemide  for now.  Closely follow intake output metabolic panel. Nonischemic cardiomyopathy status post AICD placement since patient was  hypotensive holding Entresto  metolazone  metoprolol  and  torsemide . Acute gout attack of both feet.  Has been on prednisone  for the last 4 days which will continue for 2 more days.  Closely observe. A-fib presently in sinus rhythm on amiodarone  and Eliquis . Diabetes mellitus type 2 takes Lantus  30 units since creatinine has worsened I have decreased to 15 units.  Last hemoglobin A1c was 8.9 3 months ago. Chronic anemia likely from renal disease follow CBC.  Hemoglobin at around baseline. Hyperlipidemia on statins.  Check CK levels. Neuropathy will decrease gabapentin  dose due to renal failure. GERD on Pepcid . Anxiety and depression on Klonopin  and Zoloft . BPH on tamsulosin .  Since patient has hypotension with acute on chronic renal failure will need close monitoring and more than 2 midnight stay.   DVT prophylaxis: Eliquis . Code Status: Full code. Family Communication: Discussed with patient. Disposition Plan: Monitored bed. Consults called: None. Admission status: Observation.

## 2023-10-08 NOTE — Inpatient Diabetes Management (Signed)
 Inpatient Diabetes Program Recommendations  AACE/ADA: New Consensus Statement on Inpatient Glycemic Control (2015)  Target Ranges:  Prepandial:   less than 140 mg/dL      Peak postprandial:   less than 180 mg/dL (1-2 hours)      Critically ill patients:  140 - 180 mg/dL   Lab Results  Component Value Date   GLUCAP 334 (H) 10/08/2023   HGBA1C 8.9 (A) 07/09/2023    Review of Glycemic Control  Latest Reference Range & Units 10/08/23 05:57  Glucose-Capillary 70 - 99 mg/dL 161 (H)   Diabetes history: DM 2 Outpatient Diabetes medications:  Lantus  30 units daily Novolog  30-38 units tid with meals  Current orders for Inpatient glycemic control:  Novolog  0-20 units tid with meals and HS Novolog  4 units tid with meals  Solumedrol  40 mg IV daily Inpatient Diabetes Program Recommendations:    Please consider restarting basal insulin - Semglee  30 units daily.   Thanks,  Josefa Ni, RN, BC-ADM Inpatient Diabetes Coordinator Pager [phone]  (8a-5p)

## 2023-10-09 DIAGNOSIS — I472 Ventricular tachycardia, unspecified: Secondary | ICD-10-CM

## 2023-10-09 DIAGNOSIS — I5022 Chronic systolic (congestive) heart failure: Secondary | ICD-10-CM

## 2023-10-09 DIAGNOSIS — I959 Hypotension, unspecified: Secondary | ICD-10-CM | POA: Diagnosis not present

## 2023-10-09 LAB — CBC
HCT: 34.2 % — ABNORMAL LOW (ref 39.0–52.0)
Hemoglobin: 11.6 g/dL — ABNORMAL LOW (ref 13.0–17.0)
MCH: 29.7 pg (ref 26.0–34.0)
MCHC: 33.9 g/dL (ref 30.0–36.0)
MCV: 87.7 fL (ref 80.0–100.0)
Platelets: 401 10*3/uL — ABNORMAL HIGH (ref 150–400)
RBC: 3.9 MIL/uL — ABNORMAL LOW (ref 4.22–5.81)
RDW: 13.9 % (ref 11.5–15.5)
WBC: 14 10*3/uL — ABNORMAL HIGH (ref 4.0–10.5)
nRBC: 0 % (ref 0.0–0.2)

## 2023-10-09 LAB — RENAL FUNCTION PANEL
Albumin: 3.3 g/dL — ABNORMAL LOW (ref 3.5–5.0)
Anion gap: 19 — ABNORMAL HIGH (ref 5–15)
BUN: 90 mg/dL — ABNORMAL HIGH (ref 6–20)
CO2: 24 mmol/L (ref 22–32)
Calcium: 9.8 mg/dL (ref 8.9–10.3)
Chloride: 89 mmol/L — ABNORMAL LOW (ref 98–111)
Creatinine, Ser: 3.78 mg/dL — ABNORMAL HIGH (ref 0.61–1.24)
GFR, Estimated: 18 mL/min — ABNORMAL LOW (ref >60)
Glucose, Bld: 336 mg/dL — ABNORMAL HIGH (ref 70–99)
Phosphorus: 4.6 mg/dL (ref 2.5–4.6)
Potassium: 3.1 mmol/L — ABNORMAL LOW (ref 3.5–5.1)
Sodium: 132 mmol/L — ABNORMAL LOW (ref 135–145)

## 2023-10-09 LAB — GLUCOSE, CAPILLARY
Glucose-Capillary: 351 mg/dL — ABNORMAL HIGH (ref 70–99)
Glucose-Capillary: 343 mg/dL — ABNORMAL HIGH (ref 70–99)
Glucose-Capillary: 404 mg/dL — ABNORMAL HIGH (ref 70–99)
Glucose-Capillary: 396 mg/dL — ABNORMAL HIGH (ref 70–99)

## 2023-10-09 LAB — GLUCOSE, RANDOM: Glucose, Bld: 476 mg/dL — ABNORMAL HIGH (ref 70–99)

## 2023-10-09 LAB — UREA NITROGEN, URINE: Urea Nitrogen, Ur: 331 mg/dL

## 2023-10-09 LAB — MAGNESIUM: Magnesium: 2.6 mg/dL — ABNORMAL HIGH (ref 1.7–2.4)

## 2023-10-09 MED ORDER — INSULIN ASPART 100 UNIT/ML IJ SOLN
6.0000 [IU] | Freq: Three times a day (TID) | INTRAMUSCULAR | Status: DC
Start: 2023-10-09 — End: 2023-10-13
  Administered 2023-10-10 – 2023-10-13 (×10): 6 [IU] via SUBCUTANEOUS

## 2023-10-09 MED ORDER — INSULIN GLARGINE-YFGN 100 UNIT/ML ~~LOC~~ SOLN
45.0000 [IU] | Freq: Every day | SUBCUTANEOUS | Status: DC
  Administered 2023-10-09 – 2023-10-12 (×4): 45 [IU] via SUBCUTANEOUS
  Filled 2023-10-09 (×5): qty 0.45

## 2023-10-09 MED ORDER — POTASSIUM CHLORIDE CRYS ER 20 MEQ PO TBCR
40.0000 meq | EXTENDED_RELEASE_TABLET | Freq: Once | ORAL | Status: AC
  Administered 2023-10-09: 40 meq via ORAL
  Filled 2023-10-09: qty 2

## 2023-10-09 MED ORDER — PREDNISONE 20 MG PO TABS
50.0000 mg | ORAL_TABLET | Freq: Every day | ORAL | Status: DC
  Administered 2023-10-10 – 2023-10-11 (×2): 50 mg via ORAL
  Filled 2023-10-09 (×2): qty 1

## 2023-10-09 MED ORDER — APIXABAN 5 MG PO TABS
5.0000 mg | ORAL_TABLET | Freq: Two times a day (BID) | ORAL | Status: DC
  Administered 2023-10-09 – 2023-10-13 (×8): 5 mg via ORAL
  Filled 2023-10-09 (×8): qty 1

## 2023-10-09 MED ORDER — CLONAZEPAM 0.5 MG PO TABS
0.5000 mg | ORAL_TABLET | Freq: Two times a day (BID) | ORAL | Status: DC | PRN
  Administered 2023-10-09 – 2023-10-13 (×10): 0.5 mg via ORAL
  Filled 2023-10-09 (×10): qty 1

## 2023-10-09 MED ORDER — METOPROLOL SUCCINATE ER 25 MG PO TB24
12.5000 mg | ORAL_TABLET | Freq: Every day | ORAL | Status: DC
  Administered 2023-10-09 – 2023-10-13 (×5): 12.5 mg via ORAL
  Filled 2023-10-09 (×5): qty 1

## 2023-10-09 NOTE — Plan of Care (Signed)

## 2023-10-09 NOTE — Evaluation (Signed)
 Occupational Therapy Evaluation Patient Details Name: [name] MRN: [id number] DOB: 1965-11-22 Today's Date: 10/09/2023   History of Present Illness   58 y.o. male comes to [facility] on 10/07/23 with c/o increased weakness. He reports severe gout flare up over the past few days, found to be hypotensive. PMH significant of type 2 diabetes mellitus, nonischemic cardiomyopathy with chronic systolic CHF, ICD in place with prior shocks in the setting of VT, chronic kidney disease stage IIIb, COPD, obesity,     Clinical Impressions Pt admitted for above, PTA pt was independent with ADLs and mod I for mobility using SPC or rollator, reports being bed bound the past 6 days due to pain in feet and BLE weakness. Pt currently presenting with impaired balance and activity tolerance, able to ambulate 48ft with one standing rest break, continues to c/o pain in bilat feet but he declined used of heat packs. Pt completing ADLs with CGA to setup A, OT to continue following pt acutely to progress him close to baseline and improve activity tolerance. Anticipate no post acute OT needed pending his progression.      If plan is discharge home, recommend the following:   A little help with bathing/dressing/bathroom;Assistance with cooking/housework;Assist for transportation     Functional Status Assessment   Patient has had a recent decline in their functional status and demonstrates the ability to make significant improvements in function in a reasonable and predictable amount of time.     Equipment Recommendations   Tub/shower seat     Recommendations for Other Services         Precautions/Restrictions   Precautions Precautions: Fall Recall of Precautions/Restrictions: Intact Restrictions Weight Bearing Restrictions Per Provider Order: No     Mobility Bed Mobility Overal bed mobility: Modified Independent                  Transfers Overall transfer level: Needs  assistance Equipment used: Rolling walker (2 wheels) Transfers: Sit to/from Stand Sit to Stand: Contact guard assist           General transfer comment: CGA for safety.      Balance Overall balance assessment: Needs assistance Sitting-balance support: Feet supported, No upper extremity supported Sitting balance-Leahy Scale: Good Sitting balance - Comments: sitting EOB   Standing balance support: Bilateral upper extremity supported Standing balance-Leahy Scale: Fair Standing balance comment: UE support for gait                           ADL either performed or assessed with clinical judgement   ADL Overall ADL's : Needs assistance/impaired Eating/Feeding: Independent;Sitting   Grooming: Standing;Contact guard assist   Upper Body Bathing: Standing;Contact guard assist   Lower Body Bathing: Sitting/lateral leans;Set up   Upper Body Dressing : Sitting;Set up   Lower Body Dressing: Set up;Sitting/lateral leans Lower Body Dressing Details (indicate cue type and reason): CGA for STS Toilet Transfer: Contact guard assist;Rolling walker (2 wheels);Ambulation   Toileting- Clothing Manipulation and Hygiene: Contact guard assist;Sit to/from stand       Functional mobility during ADLs: Contact guard assist;Rolling walker (2 wheels)       Vision   Vision Assessment?: No apparent visual deficits     Perception         Praxis         Pertinent Vitals/Pain Pain Assessment Pain Assessment: Faces Faces Pain Scale: Hurts even more Pain Location: gout in bilat feet. Pain Descriptors / Indicators:  Aching, Discomfort, Sharp Pain Intervention(s): Limited activity within patient's tolerance, Monitored during session     Extremity/Trunk Assessment Upper Extremity Assessment Upper Extremity Assessment: Overall WFL for tasks assessed   Lower Extremity Assessment Lower Extremity Assessment: Generalized weakness (BLE pain in feet when ambulating.)        Communication Communication Communication: No apparent difficulties   Cognition Arousal: Alert Behavior During Therapy: WFL for tasks assessed/performed Cognition: No apparent impairments                               Following commands: Intact       Cueing  General Comments   Cueing Techniques: Verbal cues  VSS on RA   Exercises     Shoulder Instructions      Home Living Family/patient expects to be discharged to:: Private residence Living Arrangements: Other relatives Available Help at Discharge: Family;Available PRN/intermittently Type of Home: House Home Access: Stairs to enter [employer] of Steps: 4   Home Layout: One level     Bathroom Shower/Tub: Chief Strategy Officer: Handicapped height     Home Equipment: Rollator (4 wheels);Cane - single point;BSC/3in1   Additional Comments: does not wear O2 at home      Prior Functioning/Environment Prior Level of Function : Independent/Modified Independent             Mobility Comments: Mod I using SPC, or rollator in communty ADLs Comments: ind    OT Problem List: Decreased strength;Decreased activity tolerance;Impaired balance (sitting and/or standing);Pain   OT Treatment/Interventions: Self-care/ADL training;Patient/family education;Therapeutic exercise;Balance training;Therapeutic activities;DME and/or AE instruction      OT Goals(Current goals can be found in the care plan section)   Acute Rehab OT Goals Patient Stated Goal: To go home OT Goal Formulation: With patient Time For Goal Achievement: 10/23/23 Potential to Achieve Goals: Good ADL Goals Pt Will Perform Grooming: Independently;standing Pt Will Perform Lower Body Bathing: Independently;sit to/from stand Pt Will Perform Lower Body Dressing: Independently;sit to/from stand Pt Will Transfer to Toilet: Independently;ambulating Pt Will Perform Toileting - Clothing Manipulation and hygiene:  Independently;sit to/from stand Pt Will Perform Tub/Shower Transfer: Tub transfer;Independently   OT Frequency:  Min 2X/week    Co-evaluation              AM-PAC OT "6 Clicks" Daily Activity     Outcome Measure Help from another person eating meals?: None Help from another person taking care of personal grooming?: A Little Help from another person toileting, which includes using toliet, bedpan, or urinal?: A Little Help from another person bathing (including washing, rinsing, drying)?: A Little Help from another person to put on and taking off regular upper body clothing?: A Little Help from another person to put on and taking off regular lower body clothing?: A Little 6 Click Score: 19   End of Session Equipment Utilized During Treatment: Gait belt;Rolling walker (2 wheels) Nurse Communication: Mobility status  Activity Tolerance: Patient tolerated treatment well Patient left: in bed;with call bell/phone within reach  OT Visit Diagnosis: Other abnormalities of gait and mobility (R26.89);Pain;Muscle weakness (generalized) (M62.81) Pain - Right/Left:  (bilat) Pain - part of body: Ankle and joints of foot                Time: 1710-1733 OT Time Calculation (min): 23 min Charges:  OT General Charges $OT Visit: 1 Visit OT Evaluation $OT Eval Low Complexity: 1 Low OT Treatments $Therapeutic Activity: 8-22  mins  10/09/2023  AB, OTR/L  Acute Rehabilitation Services  Office: [phone]   Jorene New 10/09/2023, 5:54 PM

## 2023-10-09 NOTE — Inpatient Diabetes Management (Signed)
 Inpatient Diabetes Program Recommendations  AACE/ADA: New Consensus Statement on Inpatient Glycemic Control (2015)  Target Ranges:  Prepandial:   less than 140 mg/dL      Peak postprandial:   less than 180 mg/dL (1-2 hours)      Critically ill patients:  140 - 180 mg/dL   Lab Results  Component Value Date   GLUCAP 351 (H) 10/09/2023   HGBA1C 9.3 (H) 10/08/2023    Review of Glycemic Control  Latest Reference Range & Units 10/08/23 05:57 10/08/23 11:45 10/08/23 15:58 10/08/23 20:43 10/09/23 06:14  Glucose-Capillary 70 - 99 mg/dL 161 (H) 096 (H) 045 (H) 461 (H) 351 (H)   Diabetes history: DM  Outpatient Diabetes medications:  Novolog  30-38 units tid with meals Lantus  30 units daily Current orders for Inpatient glycemic control:  Novolog  0-20 units tid with meals and HS Novolog  4 units tid with meals  Semglee  30 units daily  Inpatient Diabetes Program Recommendations:    May consider increasing Novolog  meal coverage to 8 units tid with meals and increase Semglee  to 45 units daily.   Thanks,  Josefa Ni, RN, BC-ADM Inpatient Diabetes Coordinator Pager [phone]  (8a-5p)

## 2023-10-09 NOTE — TOC Initial Note (Signed)
 Transition of Care [facility] - Initial/Assessment Note    Patient Details  Name: [name] MRN: [id number] Date of Birth: 08/19/1965  Transition of Care [facility]) CM/SW Contact:    Ernst Heap Phone Number: [phone]  10/09/2023, 12:16 PM  Clinical Narrative:   HF CSW met with patient at bedside. Patient stated that he lives with his brother. Patient stated that he does not work. Patient stated that he drives. Patient stated that he has no history of HH services. Patient stated that he uses a cane, walker, and bedside commode. Patient stated that he has a scale at home. Patient stated that he has a PCP. CSW explained that a hospital follow up appointment will be scheduled closer towards dc. Patient agrees. Patient stated that family will provide transportation home at dc.   TOC will continue following.                Expected Discharge Plan: Home/Self Care Barriers to Discharge: Continued Medical Work up   Patient Goals and CMS Choice            Expected Discharge Plan and Services       Living arrangements for the past 2 months: Single Family Home                                      Prior Living Arrangements/Services Living arrangements for the past 2 months: Single Family Home Lives with:: Siblings Patient language and need for interpreter reviewed:: Yes Do you feel safe going back to the place where you live?: Yes      Need for Family Participation in Patient Care: No (Comment) Care giver support system in place?: Yes (comment)   Criminal Activity/Legal Involvement Pertinent to Current Situation/Hospitalization: No - Comment as needed  Activities of Daily Living   ADL Screening (condition at time of admission) Independently performs ADLs?: Yes (appropriate for developmental age) Is the patient deaf or have difficulty hearing?: No Does the patient have difficulty seeing, even when wearing glasses/contacts?: No Does the patient have difficulty  concentrating, remembering, or making decisions?: No  Permission Sought/Granted                  Emotional Assessment Appearance:: Appears stated age Attitude/Demeanor/Rapport: Engaged Affect (typically observed): Appropriate Orientation: : Oriented to Situation, Oriented to  Time, Oriented to Place, Oriented to Self Alcohol / Substance Use: Not Applicable Psych Involvement: No (comment)  Admission diagnosis:  Dehydration [E86.0] ARF (acute renal failure) (HCC) [N17.9] Weakness [R53.1] AKI (acute kidney injury) (HCC) [N17.9] Acute renal failure (ARF) (HCC) [N17.9] Patient Active Problem List   Diagnosis Date Noted   ARF (acute renal failure) (HCC) 10/08/2023   Acute gout 10/08/2023   Anemia 10/08/2023   Acute renal failure (ARF) (HCC) 10/08/2023   Hypotension 09/07/2023   Acute deep vein thrombosis (DVT) of left upper extremity (HCC) 04/24/2023   Cellulitis of perineum 04/02/2023   Heart failure (HCC) 02/28/2023   Class 3 obesity 02/28/2023   SOB (shortness of breath) 02/27/2023   Loud snoring 10/28/2022   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   Hyperlipidemia associated with type 2 diabetes mellitus (HCC) 09/18/2022   PTSD (post-traumatic stress disorder) 07/10/2022   Panic attack 06/17/2022   Passive suicidal ideations 06/17/2022   History of ventricular tachycardia 06/14/2022   Essential hypertension 06/14/2022   Stage 3b chronic kidney disease (HCC) 05/15/2022   Acute gout  due to renal impairment involving left ankle 05/07/2022   Acute gout due to renal impairment involving right knee 05/07/2022   Hyponatremia 05/07/2022   Diarrhea 05/06/2022   Hyperglycemia 05/06/2022   Cellulitis of buttock 05/06/2022   Acute renal failure superimposed on stage 3b chronic kidney disease (HCC) 05/06/2022   Hypokalemia 05/05/2022   Rectal abscess 05/05/2022   Left buttock abscess 05/05/2022   Cardiac arrest (HCC) 05/03/2022   AKI (acute kidney injury) (HCC)  05/03/2022   Tachycardia 05/03/2022   CKD (chronic kidney disease) stage 2, GFR 60-89 ml/min 10/24/2019   Acute non-recurrent frontal sinusitis 06/27/2019   COVID-19 virus infection 06/27/2019   Glaucoma suspect 12/22/2018   Seasonal and perennial allergic rhinoconjunctivitis 10/25/2018   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   Urticaria 05/03/2018   S/P inguinal hernia repair 03/01/2018   Unilateral inguinal hernia without obstruction or gangrene 07/24/2017   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   Hypersomnia 02/28/2016   Abnormal PFT 02/24/2016   Dyspnea 01/28/2016   Diabetic polyneuropathy associated with type 2 diabetes mellitus (HCC) 12/18/2015   Lumbar back pain 12/18/2015   Type 2 diabetes mellitus with hyperlipidemia (HCC) 09/17/2015   Left median nerve neuropathy 04/16/2015   Lesion of right median nerve at forearm 04/16/2015   ICD (implantable cardioverter-defibrillator) in place 03/28/2014   Chronic systolic congestive heart failure (HCC) 03/28/2014   Nonischemic cardiomyopathy (HCC) 11/09/2013   Syncope 10/20/2013   Chest pain 07/28/2013   RENAL CALCULUS 05/13/2009   Obesity 05/08/2009   Former heavy cigarette smoker (20-39 per day) 05/08/2009   HOARSENESS 05/08/2009   GAD (generalized anxiety disorder) 02/28/2008   ASTHMATIC BRONCHITIS, ACUTE 02/28/2008   GERD 02/28/2008   IRRITABLE BOWEL SYNDROME 02/28/2008   PCP:  Lawrance Presume, MD Pharmacy:   [id number] - [place], [place] - [address] [place] [address] Phone: [phone] Fax: [phone]  [id number] - [place], [place] - [address] AT [facility] OF [address] [place] [address] Phone: [phone] Fax: [phone]  [facility] - [place], [place] - [address] [place] [address] Phone: [phone] Fax:  [phone]     Social Drivers of Health (SDOH) Social History: SDOH Screenings   Food Insecurity: No Food Insecurity (10/08/2023)  Housing: Low Risk  (10/08/2023)  Transportation Needs: No Transportation Needs (10/08/2023)  Utilities: Not At Risk (10/08/2023)  Alcohol Screen: Low Risk  (05/25/2023)  Depression (PHQ2-9): Low Risk  (08/07/2023)  Recent Concern: Depression (PHQ2-9) - Medium Risk (05/25/2023)  Financial Resource Strain: Low Risk  (05/25/2023)  Recent Concern: Financial Resource Strain - High Risk (03/24/2023)  Physical Activity: Inactive (05/25/2023)  Social Connections: Moderately Integrated (10/08/2023)  Stress: Stress Concern Present (05/25/2023)  Tobacco Use: Medium Risk (10/08/2023)  Health Literacy: Adequate Health Literacy (05/25/2023)   SDOH Interventions:     Readmission Risk Interventions     No data to display

## 2023-10-09 NOTE — Progress Notes (Signed)
 [facility] Progress Note Patient: [name] [id number]:[id number] DOB: 13-Apr-1966 DOA: 10/07/2023  DOS: the patient was seen and examined on 10/09/2023  Brief Hospital Course: PMH of NICM SP AICD placement, chronic A-fib, type II DM, CKD stage IIIb, HTN, gout presented to the hospital complaints of generalized weakness and fatigue.  Found to have acute gout flareup, AKI on CKD as well as hypokalemia.  Assessment and plan. AKI on CKD 3B. Baseline creatinine around 3.7.  On presentation serum creatinine 4.5. Ultrasound renal negative for any obstruction. Renal function improving with holding medication. Likely dehydration and renal etiology. Monitor.  Hypotension. Likely combination of dehydration as well as poor p.o. intake and ongoing use of cardiac medications. No evidence of active infection right now but currently receiving IV ceftriaxone . We have lowered the pseudodisc and antibiotic tomorrow.  Hypokalemia. Severe. Replace aggressively. Monitor.  NICM. AICD. NSVT. Chronic A-fib. Cardiology was consulted. Currently recommendation is to hold medication. After potassium replacement NSVT has been reduced. Will monitor.  Acute gout attack. Primary trilogy patient deconditioned I think the treatment with the prednisone  might of caused hyperglycemia which might of caused polyuria and dehydration. Treated aggressively with IV steroid with improvement. Will monitor.  Chronic neuropathy. Continue gabapentin .  GERD but Continue PPI.  HLD. On statin.  CK level normal.  Type II DM.  Uncontrolled with hyperglycemia. In the setting of steroids. Continuing home regimen of basal bolus regimen. Monitor.  BPH.  On Flomax .  Continue.  Anxiety and depression. Will continue home regimen for now.  Morbid obesity. Placing the patient at a high risk of poor outcome. Body mass index is 37.49 kg/m.   Subjective: Denies any acute complaint.  Pain was improved.  No shortness  of breath no nausea no vomiting.  Physical Exam: General: in Mild distress, No Rash Cardiovascular: S1 and S2 Present, No Murmur Respiratory: Good respiratory effort, Bilateral Air entry present. No Crackles, No wheezes Abdomen: Bowel Sound present, No tenderness Extremities: Improving ankle warmth and redness of the great toe, trace edema Neuro: Alert and oriented x3, no new focal deficit  Data Reviewed: I have Reviewed nursing notes, Vitals, and Lab results. Since last encounter, pertinent lab results CBC and BMP   . I have ordered test including CBC and BMP  .   Disposition: Status is: Inpatient Remains inpatient appropriate because: Monitor for improvement in renal function  Place and maintain sequential compression device Start: 10/08/23 0750 apixaban  (ELIQUIS ) tablet 5 mg   Family Communication: No one at bedside Level of care: Telemetry Cardiac   Vitals:   10/09/23 0725 10/09/23 0825 10/09/23 1052 10/09/23 1541  BP:  (!) 107/57 107/61 119/82  Pulse:  98 83 74  Resp:  18 14 19   Temp:  97.8 F (36.6 C) 97.8 F (36.6 C) 97.8 F (36.6 C)  TempSrc:  Oral Oral Axillary  SpO2: 93% 92% 93% 95%  Weight:      Height:         Author: Charlean Congress, MD 10/09/2023 7:41 PM  Please look on [email or web address] to find out who is on call.

## 2023-10-09 NOTE — Progress Notes (Signed)
   10/09/23 1953  BiPAP/CPAP/SIPAP  BiPAP/CPAP/SIPAP Pt Type Adult  Reason BIPAP/CPAP not in use Non-compliant  BiPAP/CPAP /SiPAP Vitals  Pulse Rate 81  SpO2 95 %  Bilateral Breath Sounds Diminished  MEWS Score/Color  MEWS Score 0  MEWS Score Color Marrie Sizer

## 2023-10-09 NOTE — Progress Notes (Addendum)
 Advanced Heart Failure Rounding Note  Cardiologist: Alexandria Angel, MD   Chief Complaint: Hypotension, chronic HFrEF  Subjective:    AM labs pending at time of evaluation.  BP improving, 90s-100s systolic. Remains off HF medications.   Gout pain improving with steroids.   Objective:   Weight Range: 128.9 kg Body mass index is 37.49 kg/m.   Vital Signs:   Temp:  [97.7 F (36.5 C)-98.5 F (36.9 C)] 97.8 F (36.6 C) (05/02 0825) Pulse Rate:  [78-98] 98 (05/02 0825) Resp:  [11-20] 18 (05/02 0825) BP: (91-141)/(55-81) 107/57 (05/02 0825) SpO2:  [92 %-96 %] 92 % (05/02 0825) Weight:  [128.9 kg] 128.9 kg (05/02 0505) Last BM Date : 10/08/23  Weight change: Filed Weights   10/07/23 2128 10/08/23 0546 10/09/23 0505  Weight: 127.9 kg 129.3 kg 128.9 kg    Intake/Output:   Intake/Output Summary (Last 24 hours) at 10/09/2023 0827 Last data filed at 10/09/2023 8469 Gross per 24 hour  Intake 820 ml  Output 1600 ml  Net -780 ml      Physical Exam    General:  Fatigued appearing Neck: JVP difficult d/t thick neck but does not appear elevated Cor: Regular rate & rhythm. No rubs, gallops or murmurs. Lungs: Clear Abdomen: Soft, nontender, nondistended.  Extremities: No edema Neuro: Alert & orientedx3. Affect pleasant   Telemetry   SR 80s, short 4-5 beat runs NSVT (improved from yesterday)  Labs    CBC Recent Labs    10/07/23 2201 10/07/23 2225 10/08/23 1152  WBC 13.4*  --  10.4  NEUTROABS 9.2*  --  6.5  HGB 12.4* 13.3 11.6*  HCT 37.1* 39.0 34.3*  MCV 90.5  --  89.6  PLT 419*  --  374   Basic Metabolic Panel Recent Labs    62/95/28 2201 10/07/23 2225 10/08/23 1152  NA 133* 129* 131*  K 3.3* 4.2 2.8*  CL 87*  --  88*  CO2 27  --  26  GLUCOSE 180*  --  279*  BUN 77*  --  83*  CREATININE 4.56*  --  4.58*  CALCIUM  10.0  --  9.1  MG  --   --  2.2   Liver Function Tests Recent Labs    10/07/23 2201 10/08/23 1152  AST 17 13*  ALT 14 11   ALKPHOS 58 53  BILITOT 0.7 0.9  PROT 8.1 7.4  ALBUMIN 3.7 3.3*   No results for input(s): "LIPASE", "AMYLASE" in the last 72 hours. Cardiac Enzymes Recent Labs    10/08/23 1152  CKTOTAL 164    BNP: BNP (last 3 results) Recent Labs    05/21/23 1205 07/30/23 1645 10/07/23 2201  BNP 29.3 27.9 18.6    ProBNP (last 3 results) No results for input(s): "PROBNP" in the last 8760 hours.   D-Dimer No results for input(s): "DDIMER" in the last 72 hours. Hemoglobin A1C Recent Labs    10/08/23 1152  HGBA1C 9.3*   Fasting Lipid Panel No results for input(s): "CHOL", "HDL", "LDLCALC", "TRIG", "CHOLHDL", "LDLDIRECT" in the last 72 hours. Thyroid  Function Tests Recent Labs    10/08/23 1152  TSH 1.019    Other results:   Imaging    US  RENAL Result Date: 10/08/2023 CLINICAL DATA:  Acute kidney injury EXAM: RENAL / URINARY TRACT ULTRASOUND COMPLETE COMPARISON:  11/19/2018 FINDINGS: Right Kidney: Renal measurements: 1.8 x 6.2 x 5.8 cm = volume: 183 mL. Echogenicity within normal limits. No mass or hydronephrosis visualized. Left Kidney: Renal  measurements: 11.0 x 5.1 x 5.3 cm = volume: 157 mL. Echogenicity within normal limits. No mass or hydronephrosis visualized. Bladder: Appears normal for degree of bladder distention. Other: None. IMPRESSION: 1. Normal renal sonogram. Electronically Signed   By: Worthy Heads M.D.   On: 10/08/2023 19:16     Medications:     Scheduled Medications:  amiodarone   400 mg Oral Daily   atorvastatin   10 mg Oral QHS   famotidine   20 mg Oral Daily   fluticasone  furoate-vilanterol  1 puff Inhalation Daily   gabapentin   100 mg Oral QHS   insulin  aspart  0-20 Units Subcutaneous TID WC   insulin  aspart  0-5 Units Subcutaneous QHS   insulin  aspart  4 Units Subcutaneous TID WC   insulin  glargine-yfgn  30 Units Subcutaneous QHS   methylPREDNISolone  (SOLU-MEDROL ) injection  40 mg Intravenous Daily   sertraline   75 mg Oral Daily   tamsulosin   0.8  mg Oral Daily    Infusions:  cefTRIAXone  (ROCEPHIN )  IV Stopped (10/08/23 1310)    PRN Medications: acetaminophen  **OR** acetaminophen , clonazePAM , mouth rinse    Patient Profile   58 y.o. male with history of HFrEF/NICM s/p ICD, VT, DM II, CKD 3b, COPD, obesity. Admitted with hypotension and AKI on CKD IV.   Assessment/Plan  Hypotension -Suspect d/t volume depletion. Volume status appeared low on remote device interrogation 04/28 -Notes decreased po intake after RSV infection/possible COPDE a month ago.  -BP improving after IVF and holding HF medications -Possible infiltrates on CXR. Procalcitonin 0.44, BC X 2 pending. UA okay. No fevers. Treating empirically with ceftriaxone    Chronic HFrEF -Nonischemic cardiomyopathy -EF previously 25-30% range, up to 40-45% on most recent echo 03/25 -NYHA III -Appears hypovolemic. Continue holding diuretics. -Hold home Entresto , Toprol  XL, Torsemide  and Metolazone . Can add back slowly as renal function recvoers -Start Toprol  XL 12.5 mg daily -No SGLT2i with hx of groin infections -Spiro had been stopped in past by Nephrology   VT -Device interrogated on admission. No sustained VT or therapies.  -Multiple runs of NSVT initially, improved today. K has been repleted. -Awaiting today's labs -Keep K > 4 and Mag > 2 -Amiodarone  increased to 400 mg daily   DM II -Uncontrolled A1c > 9 -Avoid SGLT2i with hx of groin infections -Per primary   Gout flare -Improving with steroids, on prednisone  per primary   AKI -Scr up to 4.5, baseline 2.5-2.7 -Suspect 2/2 volume depletion, received IV fluid resuscitation -Home diuretics on hold -Renal ultrasound unremarkable   Hx DVT -Has been on Eliquis , discontinued on admit. Reviewing with PharmD.  Length of Stay: 1  Cinzia Devos N, PA-C  10/09/2023, 8:27 AM  Advanced Heart Failure Team Pager [phone] (M-F; 7a - 5p)  Please contact [facility] for night-coverage after hours (5p -7a )  and weekends on [email or web address]

## 2023-10-10 ENCOUNTER — Inpatient Hospital Stay (HOSPITAL_COMMUNITY)

## 2023-10-10 DIAGNOSIS — M79672 Pain in left foot: Secondary | ICD-10-CM | POA: Diagnosis not present

## 2023-10-10 DIAGNOSIS — I9589 Other hypotension: Secondary | ICD-10-CM | POA: Diagnosis not present

## 2023-10-10 DIAGNOSIS — M19071 Primary osteoarthritis, right ankle and foot: Secondary | ICD-10-CM | POA: Diagnosis not present

## 2023-10-10 DIAGNOSIS — I5043 Acute on chronic combined systolic (congestive) and diastolic (congestive) heart failure: Secondary | ICD-10-CM

## 2023-10-10 DIAGNOSIS — I959 Hypotension, unspecified: Secondary | ICD-10-CM | POA: Diagnosis not present

## 2023-10-10 DIAGNOSIS — R918 Other nonspecific abnormal finding of lung field: Secondary | ICD-10-CM | POA: Diagnosis not present

## 2023-10-10 DIAGNOSIS — I517 Cardiomegaly: Secondary | ICD-10-CM | POA: Diagnosis not present

## 2023-10-10 DIAGNOSIS — M79671 Pain in right foot: Secondary | ICD-10-CM | POA: Diagnosis not present

## 2023-10-10 LAB — RENAL FUNCTION PANEL
Albumin: 3.3 g/dL — ABNORMAL LOW (ref 3.5–5.0)
Anion gap: 17 — ABNORMAL HIGH (ref 5–15)
BUN: 88 mg/dL — ABNORMAL HIGH (ref 6–20)
CO2: 29 mmol/L (ref 22–32)
Calcium: 10.3 mg/dL (ref 8.9–10.3)
Chloride: 91 mmol/L — ABNORMAL LOW (ref 98–111)
Creatinine, Ser: 3.05 mg/dL — ABNORMAL HIGH (ref 0.61–1.24)
GFR, Estimated: 23 mL/min — ABNORMAL LOW (ref >60)
Glucose, Bld: 236 mg/dL — ABNORMAL HIGH (ref 70–99)
Phosphorus: 4.1 mg/dL (ref 2.5–4.6)
Potassium: 3.4 mmol/L — ABNORMAL LOW (ref 3.5–5.1)
Sodium: 137 mmol/L (ref 135–145)

## 2023-10-10 LAB — CBC
HCT: 33.6 % — ABNORMAL LOW (ref 39.0–52.0)
Hemoglobin: 11.4 g/dL — ABNORMAL LOW (ref 13.0–17.0)
MCH: 29.8 pg (ref 26.0–34.0)
MCHC: 33.9 g/dL (ref 30.0–36.0)
MCV: 88 fL (ref 80.0–100.0)
Platelets: 415 10*3/uL — ABNORMAL HIGH (ref 150–400)
RBC: 3.82 MIL/uL — ABNORMAL LOW (ref 4.22–5.81)
RDW: 13.9 % (ref 11.5–15.5)
WBC: 13.6 10*3/uL — ABNORMAL HIGH (ref 4.0–10.5)
nRBC: 0.1 % (ref 0.0–0.2)

## 2023-10-10 LAB — GLUCOSE, CAPILLARY
Glucose-Capillary: 208 mg/dL — ABNORMAL HIGH (ref 70–99)
Glucose-Capillary: 219 mg/dL — ABNORMAL HIGH (ref 70–99)
Glucose-Capillary: 408 mg/dL — ABNORMAL HIGH (ref 70–99)
Glucose-Capillary: 396 mg/dL — ABNORMAL HIGH (ref 70–99)

## 2023-10-10 LAB — MAGNESIUM: Magnesium: 2.6 mg/dL — ABNORMAL HIGH (ref 1.7–2.4)

## 2023-10-10 MED ORDER — POTASSIUM CHLORIDE CRYS ER 20 MEQ PO TBCR
20.0000 meq | EXTENDED_RELEASE_TABLET | Freq: Once | ORAL | Status: AC
  Administered 2023-10-10: 20 meq via ORAL
  Filled 2023-10-10: qty 1

## 2023-10-10 MED ORDER — FAMOTIDINE 20 MG PO TABS
40.0000 mg | ORAL_TABLET | Freq: Once | ORAL | Status: AC
  Administered 2023-10-10: 40 mg via ORAL
  Filled 2023-10-10: qty 2

## 2023-10-10 NOTE — Progress Notes (Signed)
 Patient complained of indigestion and requesting for his  Famotidine  tablet, pt stated that he's taking it twice a day at home, notified Dr. Gladstone Lamer and agreed to  to give Pepcid  early. Patient had no other active complaint, kept safe and comfortable on bed, safety measures in place.

## 2023-10-10 NOTE — Progress Notes (Signed)
 Cardiologist: Alexandria Angel, MD   Chief Complaint: Hypotension, chronic HFrEF  Subjective:    Pain in legs from gout Spoke with brother Cr pending slowly improving   Objective:   Weight Range: 127.9 kg Body mass index is 37.21 kg/m.   Vital Signs:   Temp:  [97.7 F (36.5 C)-98.6 F (37 C)] 98.1 F (36.7 C) (05/03 0800) Pulse Rate:  [69-81] 71 (05/03 0800) Resp:  [16-19] 16 (05/03 0427) BP: (110-132)/(56-82) 114/56 (05/03 0800) SpO2:  [93 %-96 %] 95 % (05/03 0800) Weight:  [127.9 kg] 127.9 kg (05/03 0427) Last BM Date : 10/09/23  Weight change: Filed Weights   10/08/23 0546 10/09/23 0505 10/10/23 0427  Weight: 129.3 kg 128.9 kg 127.9 kg    Intake/Output:   Intake/Output Summary (Last 24 hours) at 10/10/2023 1111 Last data filed at 10/10/2023 1105 Gross per 24 hour  Intake 1460 ml  Output 1500 ml  Net -40 ml      Physical Exam    Obese black male AICD under left clavicle Distant heart sounds Abdomen benign Plus one bilateral edema    Telemetry   SR 80s, short 4-5 beat runs NSVT (improved from yesterday)  Labs    CBC Recent Labs    10/07/23 2201 10/07/23 2225 10/08/23 1152 10/09/23 1055  WBC 13.4*  --  10.4 14.0*  NEUTROABS 9.2*  --  6.5  --   HGB 12.4*   < > 11.6* 11.6*  HCT 37.1*   < > 34.3* 34.2*  MCV 90.5  --  89.6 87.7  PLT 419*  --  374 401*   < > = values in this interval not displayed.   Basic Metabolic Panel Recent Labs    16/10/96 1152 10/09/23 1055 10/09/23 1844  NA 131* 132*  --   K 2.8* 3.1*  --   CL 88* 89*  --   CO2 26 24  --   GLUCOSE 279* 336* 476*  BUN 83* 90*  --   CREATININE 4.58* 3.78*  --   CALCIUM  9.1 9.8  --   MG 2.2 2.6*  --   PHOS  --  4.6  --    Liver Function Tests Recent Labs    10/07/23 2201 10/08/23 1152 10/09/23 1055  AST 17 13*  --   ALT 14 11  --   ALKPHOS 58 53  --   BILITOT 0.7 0.9  --   PROT 8.1 7.4  --   ALBUMIN 3.7 3.3* 3.3*   No results for input(s): "LIPASE", "AMYLASE" in  the last 72 hours. Cardiac Enzymes Recent Labs    10/08/23 1152  CKTOTAL 164    BNP: BNP (last 3 results) Recent Labs    05/21/23 1205 07/30/23 1645 10/07/23 2201  BNP 29.3 27.9 18.6    Hemoglobin A1C Recent Labs    10/08/23 1152  HGBA1C 9.3*   Fasting Lipid Panel No results for input(s): "CHOL", "HDL", "LDLCALC", "TRIG", "CHOLHDL", "LDLDIRECT" in the last 72 hours. Thyroid  Function Tests Recent Labs    10/08/23 1152  TSH 1.019     Medications:     Scheduled Medications:  amiodarone   400 mg Oral Daily   apixaban   5 mg Oral BID   atorvastatin   10 mg Oral QHS   famotidine   20 mg Oral Daily   fluticasone  furoate-vilanterol  1 puff Inhalation Daily   gabapentin   100 mg Oral QHS   insulin  aspart  0-20 Units Subcutaneous TID WC   insulin  aspart  0-5 Units Subcutaneous QHS   insulin  aspart  6 Units Subcutaneous TID WC   insulin  glargine-yfgn  45 Units Subcutaneous QHS   metoprolol  succinate  12.5 mg Oral Daily   predniSONE   50 mg Oral Q breakfast   sertraline   75 mg Oral Daily   tamsulosin   0.8 mg Oral Daily    Infusions:  cefTRIAXone  (ROCEPHIN )  IV Stopped (10/10/23 0835)    PRN Medications: acetaminophen  **OR** acetaminophen , clonazePAM , mouth rinse    Patient Profile   58 y.o. male with history of HFrEF/NICM s/p ICD, VT, DM II, CKD 3b, COPD, obesity. Admitted with hypotension and AKI on CKD IV.   Assessment/Plan  Hypotension - related to RSV/COPD a month ago - ceftriaxone  empiric  - improved with fluids and holding GDMT for CHF   Chronic HFrEF -Nonischemic cardiomyopathy -EF previously 25-30% range, up to 40-45% on most recent echo 03/25 -NYHA III -Holding diuretics and ARNI due to ARF  -OnToprol XL 12.5 mg daily -No SGLT2i with hx of groin infections -Spiro had been stopped in past by Nephrology   VT -Device interrogated on admission. No sustained VT or therapies.  -Multiple runs of NSVT initially, improved today. K has been  repleted. -Awaiting today's labs -Keep K > 4 and Mag > 2 -Amiodarone  increased to 400 mg daily   DM II -Uncontrolled A1c > 9 -Avoid SGLT2i with hx of groin infections -Per primary   Gout flare -Improving with steroids, on prednisone  per primary   AKI -Scr better yesterday 3.78 baseline closer to 2.8  -Suspect 2/2 volume depletion, received IV fluid resuscitation -Home diuretics on hold -Renal ultrasound unremarkable   Hx DVT -Has been on Eliquis , discontinued on admit. Reviewing with PharmD.  Janelle Mediate MD [facility]

## 2023-10-10 NOTE — Progress Notes (Signed)
 PT Cancellation Note  Patient Details Name: [name] MRN: [id number] DOB: [birth year]   Cancelled Treatment:    Reason Eval/Treat Not Completed: Other (comment) Pt sleeping; when aroused, asking for therapist to leave him alone, stating he has had interruptions since 2 AM.  Verdia Glad, PT, DPT Acute Rehabilitation Services Office [phone]    Claria Crofts 10/10/2023, 2:46 PM

## 2023-10-10 NOTE — Progress Notes (Signed)
 [facility] Progress Note Patient: NEMANJA EPP BMW:[id number] DOB: 04-02-1966 DOA: 10/07/2023  DOS: the patient was seen and examined on 10/10/2023  Brief Hospital Course: As per prior documentation: "PMH of NICM SP AICD placement, chronic A-fib, type II DM, CKD stage IIIb, HTN, gout presented to the hospital complaints of generalized weakness and fatigue.  Found to have acute gout flareup, AKI on CKD, hypotension, as well as, hypokalemia.  CXR is said to reveal infiltrate and patient is currently on Rocephin .  Will repeat CXR.  Scr is slowly improving".  10/10/2023: Patient seen.  Heavy snoring is noted.  Apparently, patient has OSA but not yet on CPAP.  No fever or chills reported, will repeat chest x-ray.  Low threshold to discontinue antibiotics.  Patient reports bilateral foot pain.  Patient was x-ray of the feet.  Renal function is improving.  Likely discharge within the next 24 hours.  Assessment and plan. AKI on CKD 3B. Baseline creatinine around 3.7.  On presentation serum creatinine 4.5. Ultrasound renal negative for any obstruction. Renal function improving with holding medication. Likely dehydration and renal etiology. Monitor. 10/10/2023: Improvement in renal function noted.  Serum creatinine has improved to 3.05.  BUN of 88.  Continue to monitor closely.  Renal panel in the morning.  Hypotension. Likely combination of dehydration as well as poor p.o. intake and ongoing use of cardiac medications. No evidence of active infection right now but currently receiving IV ceftriaxone . We have lowered the pseudodisc and antibiotic tomorrow. 10/10/2023: Resolved.  Blood pressure is maintained.  Hypokalemia. -Potassium of 3.4 today. - Continue to monitor and replete.  NICM. AICD. NSVT. Chronic A-fib. Cardiology was consulted. Currently recommendation is to hold medication. After potassium replacement NSVT has been reduced. Will monitor.  Acute gout attack. -Seems to have  resolved significantly.  Chronic neuropathy. Continue gabapentin .  GERD but Continue PPI.  HLD. On statin.  CK level normal.  Type II DM.  Uncontrolled with hyperglycemia. In the setting of steroids. Continuing home regimen of basal bolus regimen. Monitor.  BPH.  On Flomax .  Continue.  Anxiety and depression. Will continue home regimen for now.  Morbid obesity. Placing the patient at a high risk of poor outcome. Body mass index is 37.21 kg/m.  PCP to consider subcutaneous semaglutide or Mounjaro if not contraindicated.  OSA:  -Awaiting CPAP.    Subjective: - No fever or chills. - No productive cough. - No chest pain. - Reports bilateral knee pain.  Physical Exam: General: Patient is obese.  Snoring loudly.  Cardiovascular: S1 and S2  Respiratory: Decreased air entry globally. Abdomen: Morbidly obese.  Nontender. Neuro: Alert and oriented x3  Data Reviewed: I have Reviewed nursing notes, Vitals, and Lab results. Since last encounter, pertinent lab results CBC and BMP   . I have ordered test including CBC and BMP  .   Disposition: Status is: Inpatient Remains inpatient appropriate because: Monitor for improvement in renal function  Place and maintain sequential compression device Start: 10/08/23 0750 apixaban  (ELIQUIS ) tablet 5 mg   Family Communication: No one at bedside Level of care: Telemetry Cardiac   Vitals:   10/10/23 0016 10/10/23 0427 10/10/23 0703 10/10/23 0800  BP: 120/73 110/64  (!) 114/56  Pulse: 69 79  71  Resp: 16 16    Temp: 97.7 F (36.5 C) 98 F (36.7 C) 98.1 F (36.7 C) 98.1 F (36.7 C)  TempSrc: Oral Oral Oral Oral  SpO2: 94% 96%  95%  Weight:  127.9 kg  Height:         Author: Doroteo Gasmen, MD 10/10/2023 10:00 AM  Please look on [email or web address] to find out who is on call.

## 2023-10-10 NOTE — Plan of Care (Signed)
  Problem: Clinical Measurements: Goal: Will remain free from infection Outcome: Progressing Goal: Respiratory complications will improve Outcome: Progressing Goal: Cardiovascular complication will be avoided Outcome: Progressing   Problem: Coping: Goal: Level of anxiety will decrease Outcome: Progressing   Problem: Elimination: Goal: Will not experience complications related to bowel motility Outcome: Progressing   Problem: Safety: Goal: Ability to remain free from injury will improve Outcome: Progressing   Problem: Coping: Goal: Ability to adjust to condition or change in health will improve Outcome: Progressing   Problem: Nutritional: Goal: Maintenance of adequate nutrition will improve Outcome: Progressing   Problem: Tissue Perfusion: Goal: Adequacy of tissue perfusion will improve Outcome: Progressing

## 2023-10-11 DIAGNOSIS — I9589 Other hypotension: Secondary | ICD-10-CM | POA: Diagnosis not present

## 2023-10-11 DIAGNOSIS — I5043 Acute on chronic combined systolic (congestive) and diastolic (congestive) heart failure: Secondary | ICD-10-CM | POA: Diagnosis not present

## 2023-10-11 DIAGNOSIS — I959 Hypotension, unspecified: Secondary | ICD-10-CM | POA: Diagnosis not present

## 2023-10-11 LAB — GLUCOSE, CAPILLARY
Glucose-Capillary: 169 mg/dL — ABNORMAL HIGH (ref 70–99)
Glucose-Capillary: 211 mg/dL — ABNORMAL HIGH (ref 70–99)
Glucose-Capillary: 215 mg/dL — ABNORMAL HIGH (ref 70–99)
Glucose-Capillary: 311 mg/dL — ABNORMAL HIGH (ref 70–99)
Glucose-Capillary: 331 mg/dL — ABNORMAL HIGH (ref 70–99)
Glucose-Capillary: 407 mg/dL — ABNORMAL HIGH (ref 70–99)
Glucose-Capillary: 444 mg/dL — ABNORMAL HIGH (ref 70–99)

## 2023-10-11 LAB — CBC
HCT: 34 % — ABNORMAL LOW (ref 39.0–52.0)
Hemoglobin: 11.5 g/dL — ABNORMAL LOW (ref 13.0–17.0)
MCH: 30 pg (ref 26.0–34.0)
MCHC: 33.8 g/dL (ref 30.0–36.0)
MCV: 88.8 fL (ref 80.0–100.0)
Platelets: 425 10*3/uL — ABNORMAL HIGH (ref 150–400)
RBC: 3.83 MIL/uL — ABNORMAL LOW (ref 4.22–5.81)
RDW: 13.8 % (ref 11.5–15.5)
WBC: 13.9 10*3/uL — ABNORMAL HIGH (ref 4.0–10.5)
nRBC: 0.4 % — ABNORMAL HIGH (ref 0.0–0.2)

## 2023-10-11 LAB — RENAL FUNCTION PANEL
Albumin: 3.3 g/dL — ABNORMAL LOW (ref 3.5–5.0)
Anion gap: 13 (ref 5–15)
BUN: 86 mg/dL — ABNORMAL HIGH (ref 6–20)
CO2: 29 mmol/L (ref 22–32)
Calcium: 10.1 mg/dL (ref 8.9–10.3)
Chloride: 93 mmol/L — ABNORMAL LOW (ref 98–111)
Creatinine, Ser: 2.78 mg/dL — ABNORMAL HIGH (ref 0.61–1.24)
GFR, Estimated: 26 mL/min — ABNORMAL LOW (ref >60)
Glucose, Bld: 254 mg/dL — ABNORMAL HIGH (ref 70–99)
Phosphorus: 4.3 mg/dL (ref 2.5–4.6)
Potassium: 3.1 mmol/L — ABNORMAL LOW (ref 3.5–5.1)
Sodium: 135 mmol/L (ref 135–145)

## 2023-10-11 LAB — MAGNESIUM: Magnesium: 2.7 mg/dL — ABNORMAL HIGH (ref 1.7–2.4)

## 2023-10-11 MED ORDER — POTASSIUM CHLORIDE 20 MEQ PO PACK
40.0000 meq | PACK | Freq: Once | ORAL | Status: AC
  Administered 2023-10-11: 40 meq via ORAL
  Filled 2023-10-11: qty 2

## 2023-10-11 MED ORDER — INSULIN ASPART 100 UNIT/ML IJ SOLN
15.0000 [IU] | Freq: Once | INTRAMUSCULAR | Status: AC
  Administered 2023-10-11: 15 [IU] via SUBCUTANEOUS

## 2023-10-11 MED ORDER — PREDNISONE 20 MG PO TABS
20.0000 mg | ORAL_TABLET | Freq: Every day | ORAL | Status: AC
  Administered 2023-10-12 – 2023-10-13 (×2): 20 mg via ORAL
  Filled 2023-10-11 (×2): qty 1

## 2023-10-11 MED ORDER — FAMOTIDINE 20 MG PO TABS
20.0000 mg | ORAL_TABLET | Freq: Two times a day (BID) | ORAL | Status: DC
  Administered 2023-10-11 – 2023-10-12 (×2): 20 mg via ORAL
  Filled 2023-10-11 (×2): qty 1

## 2023-10-11 MED ORDER — INSULIN ASPART 100 UNIT/ML IJ SOLN
15.0000 [IU] | Freq: Once | INTRAMUSCULAR | Status: AC
Start: 2023-10-11 — End: 2023-10-11
  Administered 2023-10-11: 15 [IU] via SUBCUTANEOUS

## 2023-10-11 MED ORDER — GABAPENTIN 100 MG PO CAPS
100.0000 mg | ORAL_CAPSULE | Freq: Three times a day (TID) | ORAL | Status: DC
  Administered 2023-10-11 – 2023-10-13 (×6): 100 mg via ORAL
  Filled 2023-10-11 (×6): qty 1

## 2023-10-11 NOTE — Evaluation (Signed)
 Physical Therapy Evaluation Patient Details Name: [name] MRN: [id number] DOB: 1966/01/01 Today's Date: 10/11/2023  History of Present Illness  Patient is 58 y.o. male admitted to [facility] on 10/07/23 with c/o increased weakness. He reports severe gout flare up over the past few days, found to be hypotensive. PMH significant of type 2 diabetes mellitus, nonischemic cardiomyopathy with chronic systolic CHF, ICD in place with prior shocks in the setting of VT, chronic kidney disease stage IIIb, COPD, obesity,  Clinical Impression  At baseline, patient lives with brother in one level home with 3 steps to enter. He typically ambulates independently however with increased pain in bilateral feet, he has been using SPC vs 4WW for the past several weeks. He declines having any falls. Patient is significantly limited secondary to severe pain levels in bilateral feet due to history of gout. He demonstrates difficulty transferring to standing position and requires minA for safety transferring to/from chair or BSC. Unable to ambulate. Due to patient's current functional mobility and limitations, he is at high risk for falls.  Educated patient on safe mobilization, calling for assistance, benefits of using assistive devices for offloading, and fall prevention. Patient will continue to benefit from skilled acute PT services. Recommend HHPT upon discharge home, however patient may not be agreeable.      If plan is discharge home, recommend the following: A little help with walking and/or transfers;Help with stairs or ramp for entrance;A little help with bathing/dressing/bathroom;Assistance with cooking/housework   Can travel by private vehicle        Equipment Recommendations Other (comment) (If home at current level, would recommend w/c, 2WW, and shower chair. Patient does have BSC.)  Recommendations for Other Services       Functional Status Assessment Patient has had a recent decline in their functional  status and demonstrates the ability to make significant improvements in function in a reasonable and predictable amount of time.     Precautions / Restrictions Precautions Precautions: Fall Restrictions Weight Bearing Restrictions Per Provider Order: No      Mobility  Bed Mobility Overal bed mobility: Modified Independent                  Transfers Overall transfer level: Needs assistance Equipment used: Rolling walker (2 wheels) Transfers: Sit to/from Stand, Bed to chair/wheelchair/BSC Sit to Stand: Contact guard assist Stand pivot transfers: Min assist              Ambulation/Gait               General Gait Details: Unable to tolerate gait this session secondary to worsening L foot pain.       Balance Overall balance assessment: Needs assistance Sitting-balance support: Feet supported, No upper extremity supported Sitting balance-Leahy Scale: Good     Standing balance support: Bilateral upper extremity supported Standing balance-Leahy Scale: Poor Standing balance comment: Requires heavy UE support for ability to advance feet for chair transfer.                             Pertinent Vitals/Pain Pain Assessment Pain Assessment: 0-10 Pain Score: 9  (Reported 8/10 R foot and 9/10 L foot.) Pain Location: bilateral feet Pain Descriptors / Indicators: Aching, Burning, Constant Pain Intervention(s): Limited activity within patient's tolerance, Monitored during session    Home Living Family/patient expects to be discharged to:: Private residence Living Arrangements: Other relatives;Other (Comment) (Brother) Available Help at Discharge: Family;Available PRN/intermittently Type  of Home: House Home Access: Stairs to enter Entrance Stairs-Rails: Right Entrance Stairs-Number of Steps: 3   Home Layout: One level Home Equipment: Rollator (4 wheels);Cane - single point;BSC/3in1 Additional Comments: does not require O2 at baseline    Prior  Function Prior Level of Function : Independent/Modified Independent             Mobility Comments: Patient reports at baseline (> 3 weeks ago) he was an independent ambulator. Secondary to gout flare up, has required use of SPC vs rollator. Patient does drive. ADLs Comments: Independent     Extremity/Trunk Assessment        Lower Extremity Assessment Lower Extremity Assessment: Generalized weakness       Communication   Communication Communication: No apparent difficulties    Cognition Arousal: Alert Behavior During Therapy: WFL for tasks assessed/performed   PT - Cognitive impairments: No apparent impairments                         Following commands: Intact       Cueing Cueing Techniques: Verbal cues     General Comments      Exercises     Assessment/Plan    PT Assessment Patient needs continued PT services  PT Problem List Decreased strength;Decreased balance;Decreased mobility;Decreased activity tolerance;Decreased safety awareness       PT Treatment Interventions DME instruction;Gait training;Stair training;Functional mobility training;Therapeutic activities;Therapeutic exercise;Balance training;Neuromuscular re-education;Patient/family education;Wheelchair mobility training;Manual techniques;Modalities    PT Goals (Current goals can be found in the Care Plan section)  Acute Rehab PT Goals Patient Stated Goal: Patient's goal is to return home, independent PT Goal Formulation: With patient Time For Goal Achievement: 10/25/23 Potential to Achieve Goals: Good    Frequency Min 2X/week        AM-PAC PT "6 Clicks" Mobility  Outcome Measure Help needed turning from your back to your side while in a flat bed without using bedrails?: None Help needed moving from lying on your back to sitting on the side of a flat bed without using bedrails?: None Help needed moving to and from a bed to a chair (including a wheelchair)?: A Little Help needed  standing up from a chair using your arms (e.g., wheelchair or bedside chair)?: A Little Help needed to walk in hospital room?: A Lot Help needed climbing 3-5 steps with a railing? : Total 6 Click Score: 17    End of Session   Activity Tolerance: Patient tolerated treatment well Patient left: with call bell/phone within reach;Other (comment) (On BSC with RN aware of patient's position. Patient instructed to call for assist when done.) Nurse Communication: Mobility status PT Visit Diagnosis: Unsteadiness on feet (R26.81);Muscle weakness (generalized) (M62.81);Other abnormalities of gait and mobility (R26.89)    Time: 1451-1511 PT Time Calculation (min) (ACUTE ONLY): 20 min   Charges:   PT Evaluation $PT Eval Low Complexity: 1 Low PT Treatments $Therapeutic Activity: 8-22 mins PT General Charges $$ ACUTE PT VISIT: 1 Visit         Mariano Shiver, PT, DPT [facility] Acute Rehabilitation Office: [phone]   Davey Erp 10/11/2023, 3:38 PM

## 2023-10-11 NOTE — Progress Notes (Signed)
 Cardiologist: Alexandria Angel, MD   Chief Complaint: Hypotension, chronic HFrEF  Subjective:    Pain in legs better  Seems more spry/alert Cr continues to improve    Objective:   Weight Range: 126.8 kg Body mass index is 36.88 kg/m.   Vital Signs:   Temp:  [98 F (36.7 C)-98.2 F (36.8 C)] 98 F (36.7 C) (05/04 0759) Pulse Rate:  [69-85] 78 (05/04 0759) Resp:  [18-20] 19 (05/04 0348) BP: (105-137)/(60-85) 109/63 (05/04 0759) SpO2:  [92 %-98 %] 95 % (05/04 0759) Weight:  [126.8 kg] 126.8 kg (05/04 0348) Last BM Date : 10/09/23  Weight change: Filed Weights   10/09/23 0505 10/10/23 0427 10/11/23 0348  Weight: 128.9 kg 127.9 kg 126.8 kg    Intake/Output:   Intake/Output Summary (Last 24 hours) at 10/11/2023 0934 Last data filed at 10/11/2023 0608 Gross per 24 hour  Intake 440 ml  Output 4040 ml  Net -3600 ml      Physical Exam    Obese black male AICD under left clavicle Distant heart sounds Abdomen benign Plus one bilateral edema    Telemetry   SR 80s, short 4-5 beat runs NSVT (improved from yesterday)  Labs    CBC Recent Labs    10/08/23 1152 10/09/23 1055 10/10/23 1103 10/11/23 0335  WBC 10.4   < > 13.6* 13.9*  NEUTROABS 6.5  --   --   --   HGB 11.6*   < > 11.4* 11.5*  HCT 34.3*   < > 33.6* 34.0*  MCV 89.6   < > 88.0 88.8  PLT 374   < > 415* 425*   < > = values in this interval not displayed.   Basic Metabolic Panel Recent Labs    16/10/96 1103 10/11/23 0335  NA 137 135  K 3.4* 3.1*  CL 91* 93*  CO2 29 29  GLUCOSE 236* 254*  BUN 88* 86*  CREATININE 3.05* 2.78*  CALCIUM  10.3 10.1  MG 2.6* 2.7*  PHOS 4.1 4.3   Liver Function Tests Recent Labs    10/08/23 1152 10/09/23 1055 10/10/23 1103 10/11/23 0335  AST 13*  --   --   --   ALT 11  --   --   --   ALKPHOS 53  --   --   --   BILITOT 0.9  --   --   --   PROT 7.4  --   --   --   ALBUMIN 3.3*   < > 3.3* 3.3*   < > = values in this interval not displayed.   No results  for input(s): "LIPASE", "AMYLASE" in the last 72 hours. Cardiac Enzymes Recent Labs    10/08/23 1152  CKTOTAL 164    BNP: BNP (last 3 results) Recent Labs    05/21/23 1205 07/30/23 1645 10/07/23 2201  BNP 29.3 27.9 18.6    Hemoglobin A1C Recent Labs    10/08/23 1152  HGBA1C 9.3*   Fasting Lipid Panel No results for input(s): "CHOL", "HDL", "LDLCALC", "TRIG", "CHOLHDL", "LDLDIRECT" in the last 72 hours. Thyroid  Function Tests Recent Labs    10/08/23 1152  TSH 1.019     Medications:     Scheduled Medications:  amiodarone   400 mg Oral Daily   apixaban   5 mg Oral BID   atorvastatin   10 mg Oral QHS   famotidine   20 mg Oral Daily   fluticasone  furoate-vilanterol  1 puff Inhalation Daily   gabapentin   100 mg Oral  QHS   insulin  aspart  0-20 Units Subcutaneous TID WC   insulin  aspart  0-5 Units Subcutaneous QHS   insulin  aspart  6 Units Subcutaneous TID WC   insulin  glargine-yfgn  45 Units Subcutaneous QHS   metoprolol  succinate  12.5 mg Oral Daily   predniSONE   50 mg Oral Q breakfast   sertraline   75 mg Oral Daily   tamsulosin   0.8 mg Oral Daily    Infusions:    PRN Medications: acetaminophen  **OR** acetaminophen , clonazePAM , mouth rinse    Patient Profile   58 y.o. male with history of HFrEF/NICM s/p ICD, VT, DM II, CKD 3b, COPD, obesity. Admitted with hypotension and AKI on CKD IV.   Assessment/Plan  Hypotension - related to RSV/COPD a month ago - ceftriaxone  empiric  - improved with fluids and holding GDMT for CHF   Chronic HFrEF -Nonischemic cardiomyopathy -EF previously 25-30% range, up to 40-45% on most recent echo 03/25 -NYHA III -Holding diuretics and ARNI due to ARF  -OnToprol XL 12.5 mg daily -No SGLT2i with hx of groin infections -Spiro had been stopped in past by Nephrology   VT -Device interrogated on admission. No sustained VT or therapies.  -Multiple runs of NSVT initially, improved today. K has been repleted. -Awaiting  today's labs -Keep K > 4 and Mag > 2 -Amiodarone  increased to 400 mg daily   DM II -Uncontrolled A1c > 9 -Avoid SGLT2i with hx of groin infections -Per primary   Gout flare -Improving with steroids, on prednisone  per primary   AKI -Scr better 2.78 which is his baseline  -Suspect 2/2 volume depletion, received IV fluid resuscitation -Home diuretics on hold -Renal ultrasound unremarkable   Hx DVT -Has been on Eliquis , discontinued on admit. Reviewing with PharmD.  If Cr stays at baseline would reintroduce low dose entresto  his home dose 49/51 in am and then follow I/O's and resume demedex at lower dose 20 mg daily when weight is up or I/O's positive he seems to be auto diuresing as his kidneys improve from hypotensive ATN   Janelle Mediate MD [facility]

## 2023-10-11 NOTE — Plan of Care (Signed)

## 2023-10-11 NOTE — Plan of Care (Signed)
   Problem: Education: Goal: Knowledge of General Education information will improve Description: Including pain rating scale, medication(s)/side effects and non-pharmacologic comfort measures Outcome: Progressing   Problem: Clinical Measurements: Goal: Ability to maintain clinical measurements within normal limits will improve Outcome: Progressing Goal: Will remain free from infection Outcome: Progressing

## 2023-10-11 NOTE — Progress Notes (Signed)
 [facility] Progress Note Patient: [name]:[id number] DOB: 1965-07-30 DOA: 10/07/2023  DOS: the patient was seen and examined on 10/11/2023  Brief Hospital Course: Patient is a 58 year old African-American male, morbidly obese, with past medical history significant for NICM SP AICD placement, chronic A-fib, type II DM, CKD stage IIIb, HTN, and gout.  Patient presented to the hospital with complaints of generalized weakness and fatigue.  Patient was found to have acute gout flareup, AKI on CKD, hypotension, as well as, hypokalemia.  Initial chest x-ray revealed infiltrate (query pneumonia versus atelectasis), however, had repeat chest x-ray done yesterday did not reveal any infiltrate.  Rocephin  has been discontinued.  AKI seems to have resolved.  Serum creatinine is back to baseline.  Cardiology input is highly appreciated.  Patient will likely be discharged back home when cleared for discharge by the cardiology team.    10/10/2023: Patient seen.  Heavy snoring is noted.  Apparently, patient has OSA but not yet on CPAP.  No fever or chills reported, will repeat chest x-ray.  Low threshold to discontinue antibiotics.  Patient reports bilateral foot pain.  Patient was x-ray of the feet.  Renal function is improving.    10/11/2023: Patient seen alongside patient's older brother.  Patient continues to report bilateral foot pain, and suspect neuropathic pain from diabetes mellitus.  Patient is currently on gabapentin  100 Mg p.o. nightly.  Will increase gabapentin  to 100 Mg p.o. 3 times daily.  Serum creatinine is back to baseline.  Cardiology team plans to gradually reintroduce cardiac medications.  Patient will be discharged back home when cleared for discharge by the cardiology team.  Patient will need CPAP on discharge.  Assessment and plan. AKI on CKD 3B. Baseline creatinine around 3.7.  On presentation serum creatinine 4.5. Ultrasound renal negative for any obstruction. Renal function  improving with holding medication. Likely dehydration and renal etiology. Monitor. 10/10/2023: Improvement in renal function noted.  Serum creatinine has improved to 3.05.  BUN of 88.  Continue to monitor closely.  Renal panel in the morning. 10/11/2023: AKI has resolved.  Baseline CKD 3B.  Hypotension. Likely combination of dehydration as well as poor p.o. intake and ongoing use of cardiac medications. No evidence of active infection right now but currently receiving IV ceftriaxone . We have lowered the pseudodisc and antibiotic tomorrow. 10/11/2023: Resolved.  Blood pressure is maintained.  Hypokalemia. -Potassium of 3.4 today. - Continue to monitor and replete. 10/11/2023: Potassium of 3.1 today.  Will administer KCl 40 mEq p.o. x 1 dose.  Magnesium  is above 2.  Repeat renal panel and magnesium  in the morning.  NICM. AICD. NSVT. Chronic A-fib. Cardiology was consulted. Currently recommendation is to hold medication. After potassium replacement NSVT has been reduced. Will monitor. 10/11/2023: Cardiology team is directing care.  Acute gout attack. -Seems to have resolved significantly.  Chronic neuropathy. Continue gabapentin . 10/11/2023: Increase gabapentin  to 100 Mg p.o. 3 times daily.  GERD but Continue PPI.  HLD. On statin.  CK level normal.  Type II DM.  Uncontrolled with hyperglycemia. In the setting of steroids. Continuing home regimen of basal bolus regimen. Monitor.  BPH.  On Flomax .  Continue.  Anxiety and depression. Will continue home regimen for now.  Morbid obesity. Placing the patient at a high risk of poor outcome. Body mass index is 36.88 kg/m.  10/11/2023: Patient reported severe allergy to GLP-1 agonists (query cardiac arrest)  OSA:  -Awaiting CPAP.    Subjective: - No fever or chills. - No productive cough. -  No chest pain. - Reports pain in both feet. - Patient is more communicative today.  More awake and alert  Physical Exam: General: Patient  is obese.  Patient is more awake and alert. Cardiovascular: Distant heart sounds.    Respiratory: Decreased air entry globally. Abdomen: Morbidly obese.  Nontender. Neuro: Alert and oriented x3  Data Reviewed: I have Reviewed nursing notes, Vitals, and Lab results. Since last encounter, pertinent lab results CBC and BMP   . I have ordered test including CBC and BMP  .   Disposition: Status is: Inpatient Remains inpatient appropriate because: Monitor for improvement in renal function  Place and maintain sequential compression device Start: 10/08/23 0750 apixaban  (ELIQUIS ) tablet 5 mg   Family Communication: Brother.   Level of care: Telemetry Cardiac   Vitals:   10/11/23 0033 10/11/23 0348 10/11/23 0759 10/11/23 1200  BP: 105/68 137/85 109/63 118/63  Pulse: 69 85 78   Resp: 18 19  19   Temp: 98 F (36.7 C) 98.2 F (36.8 C) 98 F (36.7 C) 98.1 F (36.7 C)  TempSrc: Oral Oral  Oral  SpO2: 95% 92% 95%   Weight:  126.8 kg    Height:         Author: Doroteo Gasmen, MD 10/11/2023 1:20 PM  Please look on [email or web address] to find out who is on call.

## 2023-10-12 ENCOUNTER — Encounter

## 2023-10-12 DIAGNOSIS — I959 Hypotension, unspecified: Secondary | ICD-10-CM | POA: Diagnosis not present

## 2023-10-12 DIAGNOSIS — N179 Acute kidney failure, unspecified: Secondary | ICD-10-CM | POA: Diagnosis not present

## 2023-10-12 DIAGNOSIS — I5022 Chronic systolic (congestive) heart failure: Secondary | ICD-10-CM | POA: Diagnosis not present

## 2023-10-12 LAB — GLUCOSE, CAPILLARY
Glucose-Capillary: 112 mg/dL — ABNORMAL HIGH (ref 70–99)
Glucose-Capillary: 155 mg/dL — ABNORMAL HIGH (ref 70–99)
Glucose-Capillary: 304 mg/dL — ABNORMAL HIGH (ref 70–99)
Glucose-Capillary: 240 mg/dL — ABNORMAL HIGH (ref 70–99)

## 2023-10-12 LAB — CBC
HCT: 34.5 % — ABNORMAL LOW (ref 39.0–52.0)
Hemoglobin: 11.7 g/dL — ABNORMAL LOW (ref 13.0–17.0)
MCH: 30.2 pg (ref 26.0–34.0)
MCHC: 33.9 g/dL (ref 30.0–36.0)
MCV: 88.9 fL (ref 80.0–100.0)
Platelets: 418 K/uL — ABNORMAL HIGH (ref 150–400)
RBC: 3.88 MIL/uL — ABNORMAL LOW (ref 4.22–5.81)
RDW: 13.9 % (ref 11.5–15.5)
WBC: 15.8 K/uL — ABNORMAL HIGH (ref 4.0–10.5)
nRBC: 0.6 % — ABNORMAL HIGH (ref 0.0–0.2)

## 2023-10-12 LAB — RENAL FUNCTION PANEL
Albumin: 3.2 g/dL — ABNORMAL LOW (ref 3.5–5.0)
Anion gap: 14 (ref 5–15)
BUN: 78 mg/dL — ABNORMAL HIGH (ref 6–20)
CO2: 24 mmol/L (ref 22–32)
Calcium: 10.3 mg/dL (ref 8.9–10.3)
Chloride: 97 mmol/L — ABNORMAL LOW (ref 98–111)
Creatinine, Ser: 2.62 mg/dL — ABNORMAL HIGH (ref 0.61–1.24)
GFR, Estimated: 27 mL/min — ABNORMAL LOW (ref >60)
Glucose, Bld: 210 mg/dL — ABNORMAL HIGH (ref 70–99)
Phosphorus: 4.1 mg/dL (ref 2.5–4.6)
Potassium: 3.5 mmol/L (ref 3.5–5.1)
Sodium: 135 mmol/L (ref 135–145)

## 2023-10-12 LAB — MAGNESIUM: Magnesium: 2.5 mg/dL — ABNORMAL HIGH (ref 1.7–2.4)

## 2023-10-12 MED ORDER — FAMOTIDINE 20 MG PO TABS
20.0000 mg | ORAL_TABLET | Freq: Every day | ORAL | Status: DC
  Administered 2023-10-13: 20 mg via ORAL

## 2023-10-12 MED ORDER — COLCHICINE 0.3 MG HALF TABLET
0.3000 mg | ORAL_TABLET | Freq: Every day | ORAL | Status: DC
  Administered 2023-10-12 – 2023-10-13 (×2): 0.3 mg via ORAL
  Filled 2023-10-12 (×2): qty 1

## 2023-10-12 MED ORDER — POTASSIUM CHLORIDE CRYS ER 20 MEQ PO TBCR
60.0000 meq | EXTENDED_RELEASE_TABLET | Freq: Once | ORAL | Status: AC
  Administered 2023-10-12: 60 meq via ORAL
  Filled 2023-10-12: qty 3

## 2023-10-12 NOTE — Progress Notes (Signed)
 Advanced Heart Failure Rounding Note  Cardiologist: Alexandria Angel, MD   Chief Complaint: Hypotension, chronic HFrEF   Patient Profile   58 y.o. male with history of HFrEF/NICM s/p ICD, VT, DM II, CKD 3b, COPD, obesity. Admitted with hypotension and AKI on CKD IV, admitted w/ hypotension and AKI on CKD IV, felt 2/2 volume depletion w/ recent decreased po intake after RSV infection/possible COPDE a month ago. SCr peaked 4.6 (b/l 2.7)  Subjective:    Interval Hx - significant improvement in SCr w/ IVF hydration and holding of GDMT/diuretics - SCr down to 2.6 today, baseline  - SBPs 110s - Has been restarted on Toprol  XL 12.5 (as of 5/2). All other GDMT on hold  - continues w/ bilateral ankle pain from acute gout flare    Objective:   Weight Range: 127 kg Body mass index is 36.94 kg/m.   Vital Signs:   Temp:  [97.8 F (36.6 C)-98.5 F (36.9 C)] 97.8 F (36.6 C) (05/05 0720) Pulse Rate:  [68-85] 69 (05/05 0720) Resp:  [16-19] 16 (05/05 0720) BP: (113-133)/(63-83) 116/67 (05/05 0720) SpO2:  [92 %-95 %] 94 % (05/05 0720) Weight:  [244 kg] 127 kg (05/05 0451) Last BM Date : 10/11/23  Weight change: Filed Weights   10/10/23 0427 10/11/23 0348 10/12/23 0451  Weight: 127.9 kg 126.8 kg 127 kg    Intake/Output:   Intake/Output Summary (Last 24 hours) at 10/12/2023 0809 Last data filed at 10/12/2023 0102 Gross per 24 hour  Intake 240 ml  Output 2530 ml  Net -2290 ml      Physical Exam    General:  Well appearing. No respiratory difficulty HEENT: normal Neck: supple. no JVD. Carotids 2+ bilat; no bruits. No lymphadenopathy or thyromegaly appreciated. Cor: PMI nondisplaced. Regular rate & rhythm. No rubs, gallops or murmurs. Lungs: clear Abdomen: soft, nontender, nondistended. No hepatosplenomegaly. No bruits or masses. Good bowel sounds. Extremities: no cyanosis, clubbing, rash, edema Neuro: alert & oriented x 3, cranial nerves grossly intact. moves all 4  extremities w/o difficulty. Affect pleasant.    Telemetry   SR 80s, short 4-5 beat runs NSVT (improved from yesterday)  Labs    CBC Recent Labs    10/11/23 0335 10/12/23 0248  WBC 13.9* 15.8*  HGB 11.5* 11.7*  HCT 34.0* 34.5*  MCV 88.8 88.9  PLT 425* 418*   Basic Metabolic Panel Recent Labs    72/53/66 0335 10/12/23 0248  NA 135 135  K 3.1* 3.5  CL 93* 97*  CO2 29 24  GLUCOSE 254* 210*  BUN 86* 78*  CREATININE 2.78* 2.62*  CALCIUM  10.1 10.3  MG 2.7* 2.5*  PHOS 4.3 4.1   Liver Function Tests Recent Labs    10/11/23 0335 10/12/23 0248  ALBUMIN 3.3* 3.2*   No results for input(s): "LIPASE", "AMYLASE" in the last 72 hours. Cardiac Enzymes No results for input(s): "CKTOTAL", "CKMB", "CKMBINDEX", "TROPONINI" in the last 72 hours.   BNP: BNP (last 3 results) Recent Labs    05/21/23 1205 07/30/23 1645 10/07/23 2201  BNP 29.3 27.9 18.6    ProBNP (last 3 results) No results for input(s): "PROBNP" in the last 8760 hours.   D-Dimer No results for input(s): "DDIMER" in the last 72 hours. Hemoglobin A1C No results for input(s): "HGBA1C" in the last 72 hours.  Fasting Lipid Panel No results for input(s): "CHOL", "HDL", "LDLCALC", "TRIG", "CHOLHDL", "LDLDIRECT" in the last 72 hours. Thyroid  Function Tests No results for input(s): "TSH", "T4TOTAL", "T3FREE", "THYROIDAB"  in the last 72 hours.  Invalid input(s): "FREET3"   Other results:   Imaging    No results found.    Medications:     Scheduled Medications:  amiodarone   400 mg Oral Daily   apixaban   5 mg Oral BID   atorvastatin   10 mg Oral QHS   famotidine   20 mg Oral BID   fluticasone  furoate-vilanterol  1 puff Inhalation Daily   gabapentin   100 mg Oral TID   insulin  aspart  0-20 Units Subcutaneous TID WC   insulin  aspart  0-5 Units Subcutaneous QHS   insulin  aspart  6 Units Subcutaneous TID WC   insulin  glargine-yfgn  45 Units Subcutaneous QHS   metoprolol  succinate  12.5 mg Oral  Daily   potassium chloride   60 mEq Oral Once   predniSONE   20 mg Oral Q breakfast   sertraline   75 mg Oral Daily   tamsulosin   0.8 mg Oral Daily    Infusions:    PRN Medications: acetaminophen  **OR** acetaminophen , clonazePAM , mouth rinse  Assessment/Plan   Hypotension -Suspect d/t volume depletion. Volume status appeared low on remote device interrogation 04/28 -Noted decreased po intake after RSV infection/possible COPDE a month ago.  -Hypotension now resolved. AKI on CKD much improved w/ IVFs. SCr back to baseline  - SBPs 110s. Recommend keeping off Entresto     Chronic HFrEF -Nonischemic cardiomyopathy -EF previously 25-30% range, up to 40-45% on most recent echo 03/25 -NYHA II-lll -Appears euvolemic on exam . AKI/hypotension resolved. SBPs 110s  -Given baseline renal fx, would keep off of Entresto . In future, can add hydralazine  for afterload reduction if BPs become elevated  -Continue Toprol  XL 12.5 mg daily -No SGLT2i with hx of groin infections -Spiro had been stopped in past by Nephrology -Can hold torsemide  for 1 more day. Likely restart tomorrow    VT -Device interrogated on admission. No sustained VT or therapies.  -Multiple runs of NSVT initially, in setting of low K, now repleted. No further runs -Keep K > 4 and Mag > 2 -Amiodarone  increased to 400 mg daily   DM II -Uncontrolled A1c > 9 -Avoid SGLT2i with hx of groin infections -Per primary   Gout flare -on prednisone  per primary - c/w pain bilatearl feet - defer addition of colchcine per primary team, now that AKI has resolved    AKI on CKD IV  -Scr up to 4.6, baseline 2.5-2.7 -Suspect 2/2 volume depletion, received IV fluid resuscitation -Renal ultrasound unremarkable -improved w/ IVFs, SCr 2.6 today  - follow BMP    Hx DVT - on Eliquis  5 mg bid   Length of Stay: [address] Nickey Kloepfer, PA-C  10/12/2023, 8:09 AM  Advanced Heart Failure Team Pager [phone] (M-F; 7a - 5p)  Please contact [facility] for night-coverage after hours (5p -7a ) and weekends on [email or web address]

## 2023-10-12 NOTE — Inpatient Diabetes Management (Addendum)
 Inpatient Diabetes Program Recommendations  AACE/ADA: New Consensus Statement on Inpatient Glycemic Control (2015)  Target Ranges:  Prepandial:   less than 140 mg/dL      Peak postprandial:   less than 180 mg/dL (1-2 hours)      Critically ill patients:  140 - 180 mg/dL   Lab Results  Component Value Date   GLUCAP 112 (H) 10/12/2023   HGBA1C 9.3 (H) 10/08/2023    Review of Glycemic Control  Latest Reference Range & Units 10/11/23 08:19 10/11/23 11:54 10/11/23 16:58 10/11/23 21:06 10/11/23 22:28 10/11/23 23:44 10/12/23 06:22  Glucose-Capillary 70 - 99 mg/dL 098 (H)  Novolog  6 169 (H)  Novolog  10 331 (H)  Novolog  21 units 444 (H)  Novolog  15 units 407 (H)  Novolog  15 untis 311 (H) 112 (H)   Diabetes history: DM  Outpatient Diabetes medications:  Novolog  30-38 units tid with meals Lantus  30 units daily Current orders for Inpatient glycemic control:  Novolog  0-20 units tid + HS Novolog  6 units tid with meals  Semglee  45 units qhs  PO Prednisone  now at 20 mg Daily (decreased today) Endocrinologist Marianna Shirk, last visit 3/6, medication changes at that time, follow up in 3 months  Inpatient Diabetes Program Recommendations:    -  May consider increasing Novolog  meal coverage to 10 units tid  Spoke with pt at bedside regarding current A1c of 9.3%. Pt reporting being on steroids and having close follow up with his Endocrinologist. Pt reports having highs and lows. More often having highs around meal times. Encouraged follow up with his Endocrinologist and call with glucose trends prior to his follow up appointment if needed.   Thanks,  Eloise Hake RN, MSN, BC-ADM Inpatient Diabetes Coordinator Team Pager [phone] (8a-5p)

## 2023-10-12 NOTE — Plan of Care (Signed)

## 2023-10-12 NOTE — Plan of Care (Signed)
 Alert and oriented.  Educated on fluid intake.   Problem: Education: Goal: Knowledge of General Education information will improve Description: Including pain rating scale, medication(s)/side effects and non-pharmacologic comfort measures Outcome: Progressing   Problem: Health Behavior/Discharge Planning: Goal: Ability to manage health-related needs will improve Outcome: Progressing   Problem: Clinical Measurements: Goal: Ability to maintain clinical measurements within normal limits will improve Outcome: Progressing Goal: Will remain free from infection Outcome: Progressing Goal: Diagnostic test results will improve Outcome: Progressing

## 2023-10-12 NOTE — Progress Notes (Signed)
 [facility] Progress Note Patient: [name] OZH:[id number] DOB: [birth year] DOA: 10/07/2023  DOS: the patient was seen and examined on 10/12/2023  Brief Hospital Course: Patient is a 59 year old African-American male, morbidly obese, with past medical history significant for NICM SP AICD placement, chronic A-fib, type II DM, CKD stage IV, HTN, and gout.  Patient presented to the hospital with complaints of generalized weakness and fatigue.  As per prior documentation, patient was found to have acute gout flareup, AKI on CKD stage IV, hypotension, as well as, hypokalemia.  Initial chest x-ray revealed infiltrate (query pneumonia versus atelectasis), however, repeat chest x-ray did not reveal any infiltrate.  Rocephin  has been discontinued.  AKI seems to have resolved, and patient's renal function is back to baseline.  Cardiology input is highly appreciated.  GDMT is currently on hold due to kidney injury.  Cardiology team is optimizing cardiac medications.  Patient will be likely discharged back home when cleared for discharge by the cardiology team.  Patient continues to report pain in both feet.    10/10/2023: Patient seen.  Heavy snoring is noted.  Apparently, patient has OSA but not yet on CPAP.  No fever or chills reported, will repeat chest x-ray.  Low threshold to discontinue antibiotics.  Patient reports bilateral foot pain.  Patient was x-ray of the feet.  Renal function is improving.    10/11/2023: Patient seen alongside patient's older brother.  Patient continues to report bilateral foot pain, and suspect neuropathic pain from diabetes mellitus.  Patient is currently on gabapentin  100 Mg p.o. nightly.  Will increase gabapentin  to 100 Mg p.o. 3 times daily.  Serum creatinine is back to baseline.  Cardiology team plans to gradually reintroduce cardiac medications.  Patient will be discharged back home when cleared for discharge by the cardiology team.  Patient will need CPAP on  discharge.  10/12/2023: Patient seen.  No new changes.  Continues to report pain in both feet.  Will start CPAP at nighttime during the hospital stay.  Patient be discharged back home once cleared for discharge by the cardiology team.  Start colchicine  0.3 Mg p.o. once daily.  Assessment and plan. AKI on CKD stage IV:. Baseline creatinine around 3.7.  On presentation serum creatinine 4.5. Ultrasound renal negative for any obstruction. Renal function improving with holding medication. Likely dehydration and renal etiology. Monitor. 10/10/2023: Improvement in renal function noted.  Serum creatinine has improved to 3.05.  BUN of 88.  Continue to monitor closely.  Renal panel in the morning. 10/11/2023: AKI has resolved.  Baseline CKD stage IV. 10/12/2023: AKI has resolved.  Patient's renal function is back to baseline.  Patient will need follow-up with nephrology team on discharge.  Hypotension: Likely combination of dehydration as well as poor p.o. intake and ongoing use of cardiac medications. No evidence of active infection right now but currently receiving IV ceftriaxone . We have lowered the pseudodisc and antibiotic tomorrow. 10/12/2023: Resolved.  Blood pressure is maintained.  Hypokalemia: -Potassium of 3.5 today. - Continue to monitor and replete. 10/11/2023: Potassium of 3.1 today.  Will administer KCl 40 mEq p.o. x 1 dose.  Magnesium  is above 2.  Repeat renal panel and magnesium  in the morning. 10/12/2023: Continue to monitor renal function and electrolytes.  NICM: AICD: NSV:. Chronic A-fib: -Cardiology team is directing care.    Acute gout attack: - Patient continues to report pain in both feet. - Patient is a diabetic. - Cannot rule out neuropathic pain. - Stop colchicine  0.3 mg p.o. once  daily.   Chronic neuropathy: 10/11/2023: Increase gabapentin  to 100 Mg p.o. 3 times daily.  GERD: Continue PPI.  Hyperlipidemia: On statin.   CK level normal.  Type II DM: -Uncontrolled  with hyperglycemia. -In the setting of steroids. -Continuing home regimen of basal bolus regimen. Monitor. - Steroid dose (prednisone ) has been decreased to 20 Mg p.o. once daily  BPH: - Continue Flomax .    Anxiety and depression: -Stable.  Morbid obesity: Placing the patient at a high risk of poor outcome. Body mass index is 36.94 kg/m.  10/11/2023: Patient reported severe allergy to GLP-1 agonists (query cardiac arrest)  OSA:  -Awaiting CPAP to be delivered to his house, according to the patient. - Start CPAP tonight (during hospital stay)  Subjective: - No shortness of breath - No chest pain. - Continues to report pain in both feet.   Physical Exam: General: Patient is obese.  Patient is more awake and alert. Cardiovascular: Distant heart sounds.    Respiratory: Decreased air entry globally. Abdomen: Morbidly obese.  Nontender. Neuro: Alert and oriented x3  Data Reviewed: I have Reviewed nursing notes, Vitals, and Lab results. Since last encounter, pertinent lab results CBC and BMP   . I have ordered test including CBC and BMP  .   Disposition: Status is: Inpatient Remains inpatient appropriate because: Monitor for improvement in renal function  Place and maintain sequential compression device Start: 10/08/23 0750 apixaban  (ELIQUIS ) tablet 5 mg   Family Communication: Brother.   Level of care: Telemetry Cardiac   Vitals:   10/11/23 2344 10/12/23 0451 10/12/23 0720 10/12/23 1105  BP: 119/83 113/71 116/67 115/66  Pulse: 77 68 69 80  Resp: 18 18 16 20   Temp: 98 F (36.7 C) 98 F (36.7 C) 97.8 F (36.6 C) 98.3 F (36.8 C)  TempSrc: Oral Oral Oral Oral  SpO2: 93% 95% 94% 97%  Weight:  127 kg    Height:         Author: Doroteo Gasmen, MD 10/12/2023 2:21 PM  Please look on [email or web address] to find out who is on call.

## 2023-10-12 NOTE — Progress Notes (Signed)
 Pt currently hospitalized.  ICM remote transmission rescheduled for 10/26/2023.

## 2023-10-12 NOTE — Progress Notes (Signed)
 Occupational Therapy Treatment Patient Details Name: [name] MRN: [id number] DOB: Jan 09, 1966 Today's Date: 10/12/2023   History of present illness Patient is 58 y.o. male admitted to [facility] [address] on 10/07/23 with c/o increased weakness. He reports severe gout flare up over the past few days, found to be hypotensive. PMH significant of type 2 diabetes mellitus, nonischemic cardiomyopathy with chronic systolic CHF, ICD in place with prior shocks in the setting of VT, chronic kidney disease stage IIIb, COPD, obesity,   OT comments  Patient seen for self care but unable to perform in standing due to pain. Patient able to get to EOB with mod I but was only able to tolerate 1+ minutes of standing and with heavy reliance on BUE support. Patient was able to perform grooming tasks seated on EOB and declined further self care tasks. Patient asking to be able to take shower but unable to ambulate to bathroom or stand to perform on this date. Acute OT to continue to follow to address established goals to facilitate discharge back home.       If plan is discharge home, recommend the following:  A little help with bathing/dressing/bathroom;Assistance with cooking/housework;Assist for transportation   Equipment Recommendations  Tub/shower seat    Recommendations for Other Services      Precautions / Restrictions Precautions Precautions: Fall Recall of Precautions/Restrictions: Intact Restrictions Weight Bearing Restrictions Per Provider Order: No       Mobility Bed Mobility Overal bed mobility: Modified Independent             General bed mobility comments: able to get to EOB and back to supine without assistance    Transfers Overall transfer level: Needs assistance Equipment used: Rolling walker (2 wheels) Transfers: Sit to/from Stand Sit to Stand: Contact guard assist           General transfer comment: Stood from EOB to RW     Balance Overall balance assessment: Needs  assistance Sitting-balance support: Feet supported, No upper extremity supported Sitting balance-Leahy Scale: Good Sitting balance - Comments: EOB   Standing balance support: Bilateral upper extremity supported Standing balance-Leahy Scale: Poor Standing balance comment: reliant on UE support to relieve BLE pain                           ADL either performed or assessed with clinical judgement   ADL Overall ADL's : Needs assistance/impaired     Grooming: Wash/dry hands;Wash/dry face;Oral care;Set up;Sitting Grooming Details (indicate cue type and reason): unable to perform in standing due to pain                               General ADL Comments: performed grooming tasks seated on EOB, declined further self care tasks. Unable to perform in standing due to pain at BLE feet and only able to tolerate 1+ minutes standing with BUE support    Extremity/Trunk Assessment              Vision       Perception     Praxis     Communication Communication Communication: No apparent difficulties   Cognition Arousal: Alert Behavior During Therapy: WFL for tasks assessed/performed Cognition: No apparent impairments                               Following commands: Intact  Cueing   Cueing Techniques: Verbal cues  Exercises      Shoulder Instructions       General Comments VSS on RA    Pertinent Vitals/ Pain       Pain Assessment Pain Assessment: Faces Faces Pain Scale: Hurts even more Pain Location: bilateral feet Pain Descriptors / Indicators: Aching, Burning, Constant Pain Intervention(s): Limited activity within patient's tolerance, Monitored during session, Repositioned  Home Living                                          Prior Functioning/Environment              Frequency  Min 2X/week        Progress Toward Goals  OT Goals(current goals can now be found in the care plan section)   Progress towards OT goals: Not progressing toward goals - comment (limited by pain)  Acute Rehab OT Goals Patient Stated Goal: to take shower OT Goal Formulation: With patient Time For Goal Achievement: 10/23/23 Potential to Achieve Goals: Good ADL Goals Pt Will Perform Grooming: Independently;standing Pt Will Perform Lower Body Bathing: Independently;sit to/from stand Pt Will Perform Lower Body Dressing: Independently;sit to/from stand Pt Will Transfer to Toilet: Independently;ambulating Pt Will Perform Toileting - Clothing Manipulation and hygiene: Independently;sit to/from stand Pt Will Perform Tub/Shower Transfer: Tub transfer;Independently  Plan      Co-evaluation                 AM-PAC OT "6 Clicks" Daily Activity     Outcome Measure   Help from another person eating meals?: None Help from another person taking care of personal grooming?: A Little Help from another person toileting, which includes using toliet, bedpan, or urinal?: A Little Help from another person bathing (including washing, rinsing, drying)?: A Little Help from another person to put on and taking off regular upper body clothing?: A Little Help from another person to put on and taking off regular lower body clothing?: A Little 6 Click Score: 19    End of Session Equipment Utilized During Treatment: Gait belt;Rolling walker (2 wheels)  OT Visit Diagnosis: Other abnormalities of gait and mobility (R26.89);Pain;Muscle weakness (generalized) (M62.81) Pain - Right/Left:  (BLE) Pain - part of body: Ankle and joints of foot   Activity Tolerance Patient limited by pain   Patient Left in bed;with call bell/phone within reach   Nurse Communication Mobility status        Time: 1610-9604 OT Time Calculation (min): 18 min  Charges: OT General Charges $OT Visit: 1 Visit OT Treatments $Self Care/Home Management : 8-22 mins  Anitra Barn, OTA Acute Rehabilitation Services  Office  [phone]   Jovita Nipper 10/12/2023, 11:02 AM

## 2023-10-13 ENCOUNTER — Other Ambulatory Visit (HOSPITAL_COMMUNITY): Payer: Self-pay

## 2023-10-13 ENCOUNTER — Ambulatory Visit: Admitting: Primary Care

## 2023-10-13 DIAGNOSIS — N179 Acute kidney failure, unspecified: Secondary | ICD-10-CM | POA: Diagnosis not present

## 2023-10-13 LAB — RENAL FUNCTION PANEL
Albumin: 3.1 g/dL — ABNORMAL LOW (ref 3.5–5.0)
Anion gap: 13 (ref 5–15)
BUN: 64 mg/dL — ABNORMAL HIGH (ref 6–20)
CO2: 22 mmol/L (ref 22–32)
Calcium: 9.6 mg/dL (ref 8.9–10.3)
Chloride: 103 mmol/L (ref 98–111)
Creatinine, Ser: 2.36 mg/dL — ABNORMAL HIGH (ref 0.61–1.24)
GFR, Estimated: 31 mL/min — ABNORMAL LOW (ref >60)
Glucose, Bld: 185 mg/dL — ABNORMAL HIGH (ref 70–99)
Phosphorus: 4.5 mg/dL (ref 2.5–4.6)
Potassium: 4.1 mmol/L (ref 3.5–5.1)
Sodium: 138 mmol/L (ref 135–145)

## 2023-10-13 LAB — GLUCOSE, CAPILLARY
Glucose-Capillary: 133 mg/dL — ABNORMAL HIGH (ref 70–99)
Glucose-Capillary: 183 mg/dL — ABNORMAL HIGH (ref 70–99)

## 2023-10-13 LAB — CULTURE, BLOOD (ROUTINE X 2)
Culture: NO GROWTH
Culture: NO GROWTH
Special Requests: ADEQUATE
Special Requests: ADEQUATE

## 2023-10-13 MED ORDER — COLCHICINE 0.6 MG PO TABS
0.3000 mg | ORAL_TABLET | Freq: Every day | ORAL | 1 refills | Status: DC
  Filled 2023-10-13: qty 30, 60d supply, fill #0

## 2023-10-13 MED ORDER — TORSEMIDE 20 MG PO TABS
20.0000 mg | ORAL_TABLET | Freq: Every day | ORAL | Status: DC
  Administered 2023-10-13: 20 mg via ORAL
  Filled 2023-10-13: qty 1

## 2023-10-13 MED ORDER — POTASSIUM CHLORIDE CRYS ER 20 MEQ PO TBCR
20.0000 meq | EXTENDED_RELEASE_TABLET | Freq: Every day | ORAL | 0 refills | Status: DC
  Filled 2023-10-13: qty 30, 30d supply, fill #0

## 2023-10-13 MED ORDER — TECHLITE PEN NEEDLES 32G X 4 MM MISC
0 refills | Status: DC
  Filled 2023-10-13: qty 100, 30d supply, fill #0

## 2023-10-13 MED ORDER — NOVOLOG FLEXPEN 100 UNIT/ML ~~LOC~~ SOPN
10.0000 [IU] | PEN_INJECTOR | Freq: Three times a day (TID) | SUBCUTANEOUS | 3 refills | Status: DC
  Filled 2023-10-13: qty 18, 30d supply, fill #0

## 2023-10-13 MED ORDER — TORSEMIDE 20 MG PO TABS
20.0000 mg | ORAL_TABLET | Freq: Every day | ORAL | 3 refills | Status: DC
  Filled 2023-10-13: qty 30, 30d supply, fill #0

## 2023-10-13 MED ORDER — FAMOTIDINE 20 MG PO TABS
20.0000 mg | ORAL_TABLET | Freq: Two times a day (BID) | ORAL | Status: DC
  Filled 2023-10-13: qty 1

## 2023-10-13 MED ORDER — METHOCARBAMOL 500 MG PO TABS: 500.0000 mg | ORAL_TABLET | Freq: Every day | ORAL | Status: DC

## 2023-10-13 MED ORDER — METOPROLOL SUCCINATE ER 25 MG PO TB24
12.5000 mg | ORAL_TABLET | Freq: Every day | ORAL | 3 refills | Status: DC
Start: 2023-10-13 — End: 2023-12-14
  Filled 2023-10-13: qty 30, 60d supply, fill #0

## 2023-10-13 MED ORDER — POTASSIUM CHLORIDE CRYS ER 20 MEQ PO TBCR
20.0000 meq | EXTENDED_RELEASE_TABLET | Freq: Every day | ORAL | 4 refills | Status: DC
  Filled 2023-10-13: qty 60, 60d supply, fill #0

## 2023-10-13 MED ORDER — AMIODARONE HCL 200 MG PO TABS
400.0000 mg | ORAL_TABLET | Freq: Every day | ORAL | 3 refills | Status: DC
  Filled 2023-10-13: qty 180, 90d supply, fill #0

## 2023-10-13 NOTE — Plan of Care (Signed)

## 2023-10-13 NOTE — Inpatient Diabetes Management (Addendum)
 Inpatient Diabetes Program Recommendations  AACE/ADA: New Consensus Statement on Inpatient Glycemic Control (2015)  Target Ranges:  Prepandial:   less than 140 mg/dL      Peak postprandial:   less than 180 mg/dL (1-2 hours)      Critically ill patients:  140 - 180 mg/dL   Lab Results  Component Value Date   GLUCAP 133 (H) 10/13/2023   HGBA1C 9.3 (H) 10/08/2023    Review of Glycemic Control  Latest Reference Range & Units 10/12/23 06:22 10/12/23 11:07 10/12/23 16:06 10/12/23 21:52 10/13/23 06:05 10/13/23 10:39  Glucose-Capillary 70 - 99 mg/dL 160 (H) 737 (H) 106 (H) 240 (H) 133 (H) 183 (H)   Diabetes history: DM  Outpatient Diabetes medications:  Novolog  30-38 units tid with meals Lantus  30 units daily Current orders for Inpatient glycemic control:  Novolog  0-20 units tid + HS Novolog  6 units tid with meals  Semglee  45 units qhs  PO Prednisone  now at 20 mg Daily (decreased today) Endocrinologist Marianna Shirk, last visit 3/6, medication changes at that time, follow up in 3 months  Inpatient Diabetes Program Recommendations:   If in the plan of care and pt is not discharged may consider:  -  May consider increasing Novolog  meal coverage to 10 units tid   Thanks,  Eloise Hake RN, MSN, BC-ADM Inpatient Diabetes Coordinator Team Pager [phone] (8a-5p)

## 2023-10-13 NOTE — Discharge Summary (Signed)
 Physician Discharge Summary   Patient: [name] MRN: [id number] DOB: 05-Mar-1966  Admit date:     10/07/2023  Discharge date: 10/13/23  Discharge Physician: Danette Duos   PCP: Lawrance Presume, MD   Recommendations at discharge:   Needs close follow up with Cardiology for further adjustment of medications.   Discharge Diagnoses: Principal Problem:   Hypotension Active Problems:   Chronic systolic congestive heart failure (HCC)   Type 2 diabetes mellitus with hyperlipidemia (HCC)   Nonischemic cardiomyopathy (HCC)   AKI (acute kidney injury) (HCC)   ARF (acute renal failure) (HCC)   Acute gout   Anemia   Acute renal failure (ARF) (HCC)  Resolved Problems:   * No resolved hospital problems. [facility] Course: Patient is a 58 year old African-American male, morbidly obese, with past medical history significant for NICM SP AICD placement, chronic A-fib, type II DM, CKD stage IV, HTN, and gout.  Patient presented to the hospital with complaints of generalized weakness and fatigue.  As per prior documentation, patient was found to have acute gout flareup, AKI on CKD stage IV, hypotension, as well as, hypokalemia.  Initial chest x-ray revealed infiltrate (query pneumonia versus atelectasis), however, repeat chest x-ray did not reveal any infiltrate.  Rocephin  has been discontinued.  AKI seems to have resolved, and patient's renal function is back to baseline.  Cardiology input is highly appreciated.  GDMT is currently on hold due to kidney injury.  Cardiology team is optimizing cardiac medications.  Patient will be likely discharged back home when cleared for discharge by the cardiology team.  Patient continues to report pain in both feet.     Medications were adjusted, he will need CPAP at discharged, gabapentin  increased to assist with pain.   Assessment and Plan: AKI on CKD stage IV:. Baseline creatinine around 3.7.  On presentation serum creatinine 4.5. Ultrasound renal  negative for any obstruction. Renal function improving with holding medication. Likely dehydration and renal etiology. Monitor. 10/10/2023: Improvement in renal function noted.  Serum creatinine has improved to 3.05.  BUN of 88.  Continue to monitor closely.  Renal panel in the morning. 10/11/2023: AKI has resolved.  Baseline CKD stage IV. 10/12/2023: AKI has resolved.  Patient's renal function is back to baseline.  Patient will need follow-up with nephrology team on discharge.   Hypotension: Likely combination of dehydration as well as poor p.o. intake and ongoing use of cardiac medications. Antibiotics discontinue 10/12/2023: Resolved.  Blood pressure is maintained.   Hypokalemia: Replaced   NICM: AICD: NSV:. Chronic A-fib: -Cardiology team is directing care.     Acute gout attack: - Patient continues to report pain in both feet. - Patient is a diabetic. - Cannot rule out neuropathic pain.    Chronic neuropathy: 10/11/2023: Increase gabapentin  to 100 Mg p.o. 3 times daily.   GERD: Continue PPI.   Hyperlipidemia: On statin.   CK level normal.   Type II DM: -Uncontrolled with hyperglycemia. -In the setting of steroids. -Continuing home regimen of basal bolus regimen. Monitor. - Steroid dose (prednisone ) taper   BPH: - Continue Flomax .     Anxiety and depression: -Stable.   Morbid obesity: Placing the patient at a high risk of poor outcome. Body mass index is 36.94 kg/m.  10/11/2023: Patient reported severe allergy to GLP-1 agonists (query cardiac arrest)   OSA:  -Awaiting CPAP to be delivered to his house, according to the patient. - CPAP(during hospital stay)  Consultants: Cardiology  Disposition: Home Diet recommendation:  Discharge Diet Orders (From admission, onward)     Start     Ordered   10/13/23 0000  Diet - low sodium heart healthy        10/13/23 1041           Cardiac diet DISCHARGE MEDICATION: Allergies as of 10/13/2023        Reactions   Bee Venom Anaphylaxis   Trulicity  [dulaglutide ] Anaphylaxis, Diarrhea   Extra dose of Trulicity  caused cardiac arrest. Patient experience onset of diarrhea with using this medication - cleared after med stopped.        Medication List     STOP taking these medications    Entresto  49-51 MG Generic drug: sacubitril -valsartan    predniSONE  20 MG tablet Commonly known as: DELTASONE        TAKE these medications    acetaminophen  500 MG tablet Commonly known as: TYLENOL  Take 1,000 mg by mouth every 8 (eight) hours as needed.   amiodarone  200 MG tablet Commonly known as: PACERONE  Take 2 tablets (400 mg total) by mouth daily. What changed: how much to take   apixaban  5 MG Tabs tablet Commonly known as: Eliquis  Take 1 tablet (5 mg total) by mouth 2 (two) times daily.   atorvastatin  10 MG tablet Commonly known as: LIPITOR Take 1 tablet by mouth once daily What changed: when to take this   cetirizine  10 MG tablet Commonly known as: EQ Allergy Relief (Cetirizine ) Take 1 tablet (10 mg total) by mouth daily.   cholecalciferol  25 MCG (1000 UNIT) tablet Commonly known as: VITAMIN D3 Take 1,000 Units by mouth in the morning.   clonazePAM  0.5 MG tablet Commonly known as: KlonoPIN  Take 1 tablet (0.5 mg total) by mouth 2 (two) times daily as needed for anxiety. Start taking on: Oct 30, 2023 What changed: when to take this   Clotrimazole  1 % Oint Apply to the affected area twice a day What changed:  how much to take how to take this when to take this reasons to take this   colchicine  0.6 MG tablet Take 0.5 tablets (0.3 mg total) by mouth daily.   EPINEPHrine  0.3 mg/0.3 mL Soaj injection Commonly known as: EPI-PEN Inject 0.3 mg into the muscle as needed.   famotidine  20 MG tablet Commonly known as: PEPCID  TAKE 1 TABLET BY MOUTH 1 TO 2 TIMES DAILY AS NEEDED What changed: See the new instructions.   fluticasone  50 MCG/ACT nasal spray Commonly known as:  FLONASE  Use 1-2 sprays in each nostril once a day as needed for nasal congestion.   gabapentin  300 MG capsule Commonly known as: NEURONTIN  Take 1 capsule (300 mg total) by mouth at bedtime.   Lantus  SoloStar 100 UNIT/ML Solostar Pen Generic drug: insulin  glargine Inject 45 Units into the skin daily. What changed:  how much to take when to take this   methocarbamol  500 MG tablet Commonly known as: ROBAXIN  Take 1 tablet (500 mg total) by mouth daily. What changed: when to take this   metolazone  5 MG tablet Commonly known as: ZAROXOLYN  Take 1 tablet (5 mg total) by mouth once a week.   metoprolol  succinate 25 MG 24 hr tablet Commonly known as: TOPROL -XL Take 0.5 tablets (12.5 mg total) by mouth daily. What changed:  medication strength how much to take   NovoLOG  FlexPen 100 UNIT/ML FlexPen Generic drug: insulin  aspart Inject 10-20 Units into the skin 3 (three) times daily before meals. What changed: how much to  take   nystatin  powder Commonly known as: MYCOSTATIN /NYSTOP  APPLY 1 APPLICATION TOPICALLY 3 (THREE) TIMES DAILY.   potassium chloride  SA 20 MEQ tablet Commonly known as: KLOR-CON  M Take 1 tablet (20 mEq total) by mouth daily. Start taking on: Oct 14, 2023 What changed: how much to take   sertraline  50 MG tablet Commonly known as: Zoloft  Take 1.5 tablets (75 mg total) by mouth in the morning.   Symbicort  160-4.5 MCG/ACT inhaler Generic drug: budesonide -formoterol  Inhale 2 puffs into the lungs daily.   Systane 0.4-0.3 % Soln Generic drug: Polyethyl Glycol-Propyl Glycol Place 1 drop into both eyes daily as needed (dry eyes).   tamsulosin  0.4 MG Caps capsule Commonly known as: FLOMAX  Take 2 capsules by mouth once daily   torsemide  20 MG tablet Commonly known as: DEMADEX  Take 1 tablet (20 mg total) by mouth daily. What changed: how much to take               Durable Medical Equipment  (From admission, onward)           Start     Ordered    10/13/23 1128  For home use only DME continuous positive airway pressure (CPAP)  Once       Question Answer Comment  Length of Need 6 Months   Patient has OSA or probable OSA Yes   Is the patient currently using CPAP in the home No   Settings Autotitration   CPAP supplies needed Mask, headgear, cushions, filters, heated tubing and water chamber      10/13/23 1127            Follow-up Information     [facility] Follow up on 10/23/2023.   Specialty: Cardiology Why: [facility] 9:30 AM Entrance C, Free Valet Parking Please bring all medications to appointment Contact information: [address] [place]  [address] [phone]        Lawrance Presume, MD. Go on 11/06/2023.   Specialty: Internal Medicine Why: @9 :30am please arrive 15 minutes early. Contact information: [address] [place] [address] [phone]                Discharge Exam: Cleavon Curls Weights   10/11/23 0348 10/12/23 0451 10/13/23 0300  Weight: 126.8 kg 127 kg 127.1 kg   General; NAD  Condition at discharge: stable  The results of significant diagnostics from this hospitalization (including imaging, microbiology, ancillary and laboratory) are listed below for reference.   Imaging Studies: DG Foot Complete Right Result Date: 10/10/2023 CLINICAL DATA:  Bilateral foot pain, weakness.  History of gout EXAM: RIGHT FOOT COMPLETE - 3+ VIEW COMPARISON:  None Available. FINDINGS: Degenerative changes of the 1st MTP joint with joint space narrowing and spurring. Subchondral cyst formation. Remainder the joint spaces are maintained. No fracture, subluxation or dislocation. Soft tissues are intact. 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 No acute bony abnormality. Electronically Signed   By: Janeece Mechanic M.D.   On: 10/10/2023 18:36   DG Foot Complete Left Result Date: 10/10/2023 CLINICAL DATA:   Bilateral foot pain, weakness.  History of gout. EXAM: LEFT FOOT - COMPLETE 3+ VIEW COMPARISON:  02/04/2021 FINDINGS: There is no evidence of fracture or dislocation. There is no evidence of arthropathy or other focal bone abnormality. Soft tissues are unremarkable. IMPRESSION: Negative. Electronically Signed   By: Janeece Mechanic M.D.   On: 10/10/2023 18:35   DG CHEST PORT  1 VIEW Result Date: 10/10/2023 CLINICAL DATA:  Infiltrate noted on imaging study. EXAM: PORTABLE CHEST 1 VIEW COMPARISON:  10/07/2023 FINDINGS: There is a left chest wall ICD with leads in the right atrial appendage and right ventricle. Stable cardiac enlargement. The lungs are hypoinflated. No pleural effusion, interstitial edema or airspace consolidation. Visualized osseous structures appear grossly intact., IMPRESSION: 1. Cardiac enlargement. 2. No acute findings. Electronically Signed   By: Kimberley Penman M.D.   On: 10/10/2023 12:15   US  RENAL Result Date: 10/08/2023 CLINICAL DATA:  Acute kidney injury EXAM: RENAL / URINARY TRACT ULTRASOUND COMPLETE COMPARISON:  11/19/2018 FINDINGS: Right Kidney: Renal measurements: 1.8 x 6.2 x 5.8 cm = volume: 183 mL. Echogenicity within normal limits. No mass or hydronephrosis visualized. Left Kidney: Renal measurements: 11.0 x 5.1 x 5.3 cm = volume: 157 mL. Echogenicity within normal limits. No mass or hydronephrosis visualized. Bladder: Appears normal for degree of bladder distention. Other: None. IMPRESSION: 1. Normal renal sonogram. Electronically Signed   By: Worthy Heads M.D.   On: 10/08/2023 19:16   DG Chest Portable 1 View Result Date: 10/07/2023 CLINICAL DATA:  Weakness EXAM: PORTABLE CHEST 1 VIEW COMPARISON:  09/07/2023 FINDINGS: Cardiac pacemaker. Shallow inspiration. Cardiac enlargement. No vascular congestion. Atelectasis or infiltration demonstrated in the right lung base. No pleural effusion or pneumothorax. IMPRESSION: Cardiac enlargement. Shallow inspiration with atelectasis or  infiltration in the right base. Electronically Signed   By: Boyce Byes M.D.   On: 10/07/2023 22:25    Microbiology: Results for orders placed or performed during the hospital encounter of 10/07/23  Culture, blood (Routine X 2) w Reflex to ID Panel     Status: None   Collection Time: 10/08/23 11:52 AM   Specimen: BLOOD LEFT HAND  Result Value Ref Range Status   Specimen Description BLOOD LEFT HAND  Final   Special Requests   Final    BOTTLES DRAWN AEROBIC AND ANAEROBIC Blood Culture adequate volume   Culture   Final    NO GROWTH 5 DAYS Performed at [facility], [address], [place], [place] [address]    Report Status 10/13/2023 FINAL  Final  Culture, blood (Routine X 2) w Reflex to ID Panel     Status: None   Collection Time: 10/08/23 11:52 AM   Specimen: BLOOD LEFT HAND  Result Value Ref Range Status   Specimen Description BLOOD LEFT HAND  Final   Special Requests AEROBIC BOTTLE ONLY Blood Culture adequate volume  Final   Culture   Final    NO GROWTH 5 DAYS Performed at [facility], [address] [place], [place] [address]    Report Status 10/13/2023 FINAL  Final   *Note: Due to a large number of results and/or encounters for the requested time period, some results have not been displayed. A complete set of results can be found in Results Review.    Labs: CBC: Recent Labs  Lab 10/07/23 2201 10/07/23 2225 10/08/23 1152 10/09/23 1055 10/10/23 1103 10/11/23 0335 10/12/23 0248  WBC 13.4*  --  10.4 14.0* 13.6* 13.9* 15.8*  NEUTROABS 9.2*  --  6.5  --   --   --   --   HGB 12.4*   < > 11.6* 11.6* 11.4* 11.5* 11.7*  HCT 37.1*   < > 34.3* 34.2* 33.6* 34.0* 34.5*  MCV 90.5  --  89.6 87.7 88.0 88.8 88.9  PLT 419*  --  374 401* 415* 425* 418*   < > =  values in this interval not displayed.   Basic Metabolic Panel: Recent Labs  Lab 10/08/23 1152 10/09/23 1055 10/09/23 1844 10/10/23 1103 10/11/23 0335 10/12/23 0248 10/13/23 0859  NA 131* 132*  --   137 135 135 138  K 2.8* 3.1*  --  3.4* 3.1* 3.5 4.1  CL 88* 89*  --  91* 93* 97* 103  CO2 26 24  --  29 29 24 22   GLUCOSE 279* 336* 476* 236* 254* 210* 185*  BUN 83* 90*  --  88* 86* 78* 64*  CREATININE 4.58* 3.78*  --  3.05* 2.78* 2.62* 2.36*  CALCIUM  9.1 9.8  --  10.3 10.1 10.3 9.6  MG 2.2 2.6*  --  2.6* 2.7* 2.5*  --   PHOS  --  4.6  --  4.1 4.3 4.1 4.5   Liver Function Tests: Recent Labs  Lab 10/07/23 2201 10/08/23 1152 10/09/23 1055 10/10/23 1103 10/11/23 0335 10/12/23 0248 10/13/23 0859  AST 17 13*  --   --   --   --   --   ALT 14 11  --   --   --   --   --   ALKPHOS 58 53  --   --   --   --   --   BILITOT 0.7 0.9  --   --   --   --   --   PROT 8.1 7.4  --   --   --   --   --   ALBUMIN 3.7 3.3* 3.3* 3.3* 3.3* 3.2* 3.1*   CBG: Recent Labs  Lab 10/12/23 1107 10/12/23 1606 10/12/23 2152 10/13/23 0605 10/13/23 1039  GLUCAP 155* 304* 240* 133* 183*    Discharge time spent: greater than 30 minutes.  Signed: Danette Duos, MD [facility] 10/13/2023

## 2023-10-13 NOTE — Progress Notes (Signed)
 Discharge instructions (including medications) discussed with and copy provided to patient/caregiver. Patient verbalized understanding. PIV removed and patient dressing himself now.  TOC medications needed to be picked up at discharge. Plan is to go to the discharge lounge this morning.

## 2023-10-13 NOTE — Progress Notes (Incomplete)
 PROGRESS NOTE    [name] ISAIAH  [name]:[id number] DOB: [birth year] DOA: 10/07/2023 PCP: Lawrance Presume, MD   Brief Narrative: ***   Assessment & Plan:   Principal Problem:   Hypotension Active Problems:   Chronic systolic congestive heart failure (HCC)   Type 2 diabetes mellitus with hyperlipidemia (HCC)   Nonischemic cardiomyopathy (HCC)   AKI (acute kidney injury) (HCC)   ARF (acute renal failure) (HCC)   Acute gout   Anemia   Acute renal failure (ARF) (HCC)   ***                   Estimated body mass index is 36.95 kg/m as calculated from the following:   Height as of this encounter: 6\' 1"  (1.854 m).   Weight as of this encounter: 127.1 kg.   DVT prophylaxis:  Code Status:  Family Communication: Disposition Plan:  Status is: Inpatient {Inpatient:23812}    Consultants:  ***  Procedures:  ***  Antimicrobials:    Subjective: ***  Objective: Vitals:   10/12/23 2034 10/13/23 0057 10/13/23 0300 10/13/23 0734  BP: 127/64 121/72 105/81 106/61  Pulse: 69 65 79 63  Resp: 18 18 18 18   Temp: 97.7 F (36.5 C) 97.8 F (36.6 C) 99.3 F (37.4 C) 98.7 F (37.1 C)  TempSrc: Oral Oral Oral Oral  SpO2: 96% 97% 94% 91%  Weight:   127.1 kg   Height:        Intake/Output Summary (Last 24 hours) at 10/13/2023 0737 Last data filed at 10/13/2023 0458 Gross per 24 hour  Intake 1010 ml  Output 2175 ml  Net -1165 ml   Filed Weights   10/11/23 0348 10/12/23 0451 10/13/23 0300  Weight: 126.8 kg 127 kg 127.1 kg    Examination:  General exam: Appears calm and comfortable  Respiratory system: Clear to auscultation. Respiratory effort normal. Cardiovascular system: S1 & S2 heard, RRR. No JVD, murmurs, rubs, gallops or clicks. No pedal edema. Gastrointestinal system: Abdomen is nondistended, soft and nontender. No organomegaly or masses felt. Normal bowel sounds heard. Central nervous system: Alert and oriented. No focal neurological  deficits. Extremities: Symmetric 5 x 5 power. Skin: No rashes, lesions or ulcers Psychiatry: Judgement and insight appear normal. Mood & affect appropriate.     Data Reviewed: I have personally reviewed following labs and imaging studies  CBC: Recent Labs  Lab 10/07/23 2201 10/07/23 2225 10/08/23 1152 10/09/23 1055 10/10/23 1103 10/11/23 0335 10/12/23 0248  WBC 13.4*  --  10.4 14.0* 13.6* 13.9* 15.8*  NEUTROABS 9.2*  --  6.5  --   --   --   --   HGB 12.4*   < > 11.6* 11.6* 11.4* 11.5* 11.7*  HCT 37.1*   < > 34.3* 34.2* 33.6* 34.0* 34.5*  MCV 90.5  --  89.6 87.7 88.0 88.8 88.9  PLT 419*  --  374 401* 415* 425* 418*   < > = values in this interval not displayed.   Basic Metabolic Panel: Recent Labs  Lab 10/08/23 1152 10/09/23 1055 10/09/23 1844 10/10/23 1103 10/11/23 0335 10/12/23 0248  NA 131* 132*  --  137 135 135  K 2.8* 3.1*  --  3.4* 3.1* 3.5  CL 88* 89*  --  91* 93* 97*  CO2 26 24  --  29 29 24   GLUCOSE 279* 336* 476* 236* 254* 210*  BUN 83* 90*  --  88* 86* 78*  CREATININE 4.58* 3.78*  --  3.05* 2.78* 2.62*  CALCIUM  9.1 9.8  --  10.3 10.1 10.3  MG 2.2 2.6*  --  2.6* 2.7* 2.5*  PHOS  --  4.6  --  4.1 4.3 4.1   GFR: Estimated Creatinine Clearance: 42.9 mL/min (A) (by C-G formula based on SCr of 2.62 mg/dL (H)). Liver Function Tests: Recent Labs  Lab 10/07/23 2201 10/08/23 1152 10/09/23 1055 10/10/23 1103 10/11/23 0335 10/12/23 0248  AST 17 13*  --   --   --   --   ALT 14 11  --   --   --   --   ALKPHOS 58 53  --   --   --   --   BILITOT 0.7 0.9  --   --   --   --   PROT 8.1 7.4  --   --   --   --   ALBUMIN 3.7 3.3* 3.3* 3.3* 3.3* 3.2*   No results for input(s): "LIPASE", "AMYLASE" in the last 168 hours. No results for input(s): "AMMONIA" in the last 168 hours. Coagulation Profile: No results for input(s): "INR", "PROTIME" in the last 168 hours. Cardiac Enzymes: Recent Labs  Lab 10/08/23 1152  CKTOTAL 164   BNP (last 3 results) No results  for input(s): "PROBNP" in the last 8760 hours. HbA1C: No results for input(s): "HGBA1C" in the last 72 hours. CBG: Recent Labs  Lab 10/12/23 0622 10/12/23 1107 10/12/23 1606 10/12/23 2152 10/13/23 0605  GLUCAP 112* 155* 304* 240* 133*   Lipid Profile: No results for input(s): "CHOL", "HDL", "LDLCALC", "TRIG", "CHOLHDL", "LDLDIRECT" in the last 72 hours. Thyroid  Function Tests: No results for input(s): "TSH", "T4TOTAL", "FREET4", "T3FREE", "THYROIDAB" in the last 72 hours. Anemia Panel: No results for input(s): "VITAMINB12", "FOLATE", "FERRITIN", "TIBC", "IRON", "RETICCTPCT" in the last 72 hours. Sepsis Labs: Recent Labs  Lab 10/08/23 1145 10/08/23 1152  PROCALCITON  --  0.44  LATICACIDVEN 2.0*  --     Recent Results (from the past 240 hours)  Culture, blood (Routine X 2) w Reflex to ID Panel     Status: None (Preliminary result)   Collection Time: 10/08/23 11:52 AM   Specimen: BLOOD LEFT HAND  Result Value Ref Range Status   Specimen Description BLOOD LEFT HAND  Final   Special Requests   Final    BOTTLES DRAWN AEROBIC AND ANAEROBIC Blood Culture adequate volume   Culture   Final    NO GROWTH 4 DAYS Performed at [facility], [address] [place], [place] [address]    Report Status PENDING  Incomplete  Culture, blood (Routine X 2) w Reflex to ID Panel     Status: None (Preliminary result)   Collection Time: 10/08/23 11:52 AM   Specimen: BLOOD LEFT HAND  Result Value Ref Range Status   Specimen Description BLOOD LEFT HAND  Final   Special Requests AEROBIC BOTTLE ONLY Blood Culture adequate volume  Final   Culture   Final    NO GROWTH 4 DAYS Performed at [facility], [address] [place], [place] [address]    Report Status PENDING  Incomplete         Radiology Studies: No results found.      Scheduled Meds:  amiodarone   400 mg Oral Daily   apixaban   5 mg Oral BID   atorvastatin   10 mg Oral QHS   colchicine   0.3 mg Oral Daily    famotidine   20 mg Oral Daily   fluticasone  furoate-vilanterol  1 puff  Inhalation Daily   gabapentin   100 mg Oral TID   insulin  aspart  0-20 Units Subcutaneous TID WC   insulin  aspart  0-5 Units Subcutaneous QHS   insulin  aspart  6 Units Subcutaneous TID WC   insulin  glargine-yfgn  45 Units Subcutaneous QHS   metoprolol  succinate  12.5 mg Oral Daily   predniSONE   20 mg Oral Q breakfast   sertraline   75 mg Oral Daily   tamsulosin   0.8 mg Oral Daily   Continuous Infusions:   LOS: 5 days    Time spent: 35 minutes    Remell Giaimo A Ahava Kissoon, MD [facility]   If 7PM-7AM, please contact night-coverage [email or web address]  10/13/2023, 7:37 AM

## 2023-10-13 NOTE — TOC Transition Note (Addendum)
 Transition of Care [facility]) - Discharge Note   Patient Details  Name: [name] MRN: [id number] Date of Birth: 18-Dec-1965  Transition of [facility]) CM/SW Contact:  Benjiman Bras, RN Phone Number: [phone] 10/13/2023, 11:48 AM   Clinical Narrative:     TOC CM spoke to pt and states he had follow up with his Pulm specialist to follow up on CPAP. States the appt was cancelled and was pushed back to July. TOC CM reached out to [facility] provider to follow up on CPAP and earlier appt. Pt reports he has completed his sleep study. Pt states his brother will provide transportation home.   Updated his PCP appt with Dr Lincoln Renshaw, 11/06/2023. Cancelled appt with Zelda FNP-BC at [facility]. Had 2 appts on same day.   Contacted [facility], scheduled follow up appt with Pulm specialist tomorrow at 330 pm. CM contacted pt tomorrow to make aware.   Final next level of care: Home/Self Care Barriers to Discharge: No Barriers Identified   Patient Goals and CMS Choice Patient states their goals for this hospitalization and ongoing recovery are:: wants to remain well          Discharge Placement                       Discharge Plan and Services Additional resources added to the After Visit Summary for     Discharge Planning Services: CM Consult                                 Social Drivers of Health (SDOH) Interventions SDOH Screenings   Food Insecurity: No Food Insecurity (10/08/2023)  Housing: Low Risk  (10/08/2023)  Transportation Needs: No Transportation Needs (10/08/2023)  Utilities: Not At Risk (10/08/2023)  Alcohol Screen: Low Risk  (05/25/2023)  Depression (PHQ2-9): Low Risk  (08/07/2023)  Recent Concern: Depression (PHQ2-9) - Medium Risk (05/25/2023)  Financial Resource Strain: Low Risk  (05/25/2023)  Recent Concern: Financial Resource Strain - High Risk (03/24/2023)  Physical Activity: Inactive (05/25/2023)  Social Connections: Moderately Integrated (10/08/2023)   Stress: Stress Concern Present (05/25/2023)  Tobacco Use: Medium Risk (10/08/2023)  Health Literacy: Adequate Health Literacy (05/25/2023)     Readmission Risk Interventions     No data to display

## 2023-10-13 NOTE — Plan of Care (Signed)
 Nursing Discharge Note   Name: [name] MRN: [id number] DOB: July 08, 1965    Admit Date:  10/07/2023  Discharge Date:  10/13/2023   [name] is to be discharged home per MD order.  AVS completed. Reviewed with patient and family at bedside. Highlighted copy provided for patient to take home.  Patient/caregiver able to verbalize understanding of discharge instructions. PIV removed. Patient stable upon discharge.   Needs Sleep Study before CPAP can be ordered.  Discharging to Discharge Lounge.  TOC Pharmacy Medications to be filled. Patient and Discharge Lounge staff aware of [facility] medications.     Discharge Instructions   None      Discharge Instructions     Ambulatory Referral for Lung Cancer Scre   Complete by: As directed    Diet - low sodium heart healthy   Complete by: As directed    Increase activity slowly   Complete by: As directed         Allergies as of 10/13/2023       Reactions   Bee Venom Anaphylaxis   Trulicity  [dulaglutide ] Anaphylaxis, Diarrhea   Extra dose of Trulicity  caused cardiac arrest. Patient experience onset of diarrhea with using this medication - cleared after med stopped.        Medication List     STOP taking these medications    Entresto  49-51 MG Generic drug: sacubitril -valsartan    predniSONE  20 MG tablet Commonly known as: DELTASONE        TAKE these medications    acetaminophen  500 MG tablet Commonly known as: TYLENOL  Take 1,000 mg by mouth every 8 (eight) hours as needed.   amiodarone  200 MG tablet Commonly known as: PACERONE  Take 2 tablets (400 mg total) by mouth daily. What changed: how much to take   apixaban  5 MG Tabs tablet Commonly known as: Eliquis  Take 1 tablet (5 mg total) by mouth 2 (two) times daily.   atorvastatin  10 MG tablet Commonly known as: LIPITOR Take 1 tablet by mouth once daily What changed: when to take this   cetirizine  10 MG tablet Commonly known as: EQ Allergy  Relief (Cetirizine ) Take 1 tablet (10 mg total) by mouth daily.   cholecalciferol  25 MCG (1000 UNIT) tablet Commonly known as: VITAMIN D3 Take 1,000 Units by mouth in the morning.   clonazePAM  0.5 MG tablet Commonly known as: KlonoPIN  Take 1 tablet (0.5 mg total) by mouth 2 (two) times daily as needed for anxiety. Start taking on: Oct 30, 2023 What changed: when to take this   Clotrimazole  1 % Oint Apply to the affected area twice a day What changed:  how much to take how to take this when to take this reasons to take this   colchicine  0.6 MG tablet Take 0.5 tablets (0.3 mg total) by mouth daily.   EPINEPHrine  0.3 mg/0.3 mL Soaj injection Commonly known as: EPI-PEN Inject 0.3 mg into the muscle as needed.   famotidine  20 MG tablet Commonly known as: PEPCID  TAKE 1 TABLET BY MOUTH 1 TO 2 TIMES DAILY AS NEEDED What changed: See the new instructions.   fluticasone  50 MCG/ACT nasal spray Commonly known as: FLONASE  Use 1-2 sprays in each nostril once a day as needed for nasal congestion.   gabapentin  300 MG capsule Commonly known as: NEURONTIN  Take 1 capsule (300 mg total) by mouth at bedtime.   Lantus  SoloStar 100 UNIT/ML Solostar Pen Generic drug: insulin  glargine Inject 45 Units into the skin daily. What changed:  how  much to take when to take this   methocarbamol  500 MG tablet Commonly known as: ROBAXIN  Take 1 tablet (500 mg total) by mouth daily. What changed: when to take this   metolazone  5 MG tablet Commonly known as: ZAROXOLYN  Take 1 tablet (5 mg total) by mouth once a week.   metoprolol  succinate 25 MG 24 hr tablet Commonly known as: TOPROL -XL Take 0.5 tablets (12.5 mg total) by mouth daily. What changed:  medication strength how much to take   NovoLOG  FlexPen 100 UNIT/ML FlexPen Generic drug: insulin  aspart Inject 10-20 Units into the skin 3 (three) times daily before meals. What changed: how much to take   nystatin  powder Commonly known as:  MYCOSTATIN /NYSTOP  APPLY 1 APPLICATION TOPICALLY 3 (THREE) TIMES DAILY.   potassium chloride  SA 20 MEQ tablet Commonly known as: KLOR-CON  M Take 1 tablet (20 mEq total) by mouth daily. Start taking on: Oct 14, 2023 What changed: how much to take   sertraline  50 MG tablet Commonly known as: Zoloft  Take 1.5 tablets (75 mg total) by mouth in the morning.   Symbicort  160-4.5 MCG/ACT inhaler Generic drug: budesonide -formoterol  Inhale 2 puffs into the lungs daily.   Systane 0.4-0.3 % Soln Generic drug: Polyethyl Glycol-Propyl Glycol Place 1 drop into both eyes daily as needed (dry eyes).   tamsulosin  0.4 MG Caps capsule Commonly known as: FLOMAX  Take 2 capsules by mouth once daily   torsemide  20 MG tablet Commonly known as: DEMADEX  Take 1 tablet (20 mg total) by mouth daily. What changed: how much to take               Durable Medical Equipment  (From admission, onward)           Start     Ordered   10/13/23 1128  For home use only DME continuous positive airway pressure (CPAP)  Once       Question Answer Comment  Length of Need 6 Months   Patient has OSA or probable OSA Yes   Is the patient currently using CPAP in the home No   Settings Autotitration   CPAP supplies needed Mask, headgear, cushions, filters, heated tubing and water chamber      10/13/23 1127             Patient escorted via wheelchair to Discharge Lounge to awaiting family transportation to home.

## 2023-10-14 ENCOUNTER — Telehealth: Payer: Self-pay | Admitting: Primary Care

## 2023-10-14 ENCOUNTER — Telehealth: Payer: Self-pay

## 2023-10-14 ENCOUNTER — Ambulatory Visit: Admitting: Primary Care

## 2023-10-14 ENCOUNTER — Telehealth: Payer: Self-pay | Admitting: *Deleted

## 2023-10-14 ENCOUNTER — Encounter: Payer: Self-pay | Admitting: Primary Care

## 2023-10-14 DIAGNOSIS — G4733 Obstructive sleep apnea (adult) (pediatric): Secondary | ICD-10-CM

## 2023-10-14 NOTE — Transitions of Care (Post Inpatient/ED Visit) (Signed)
   10/14/2023  Name: [name] MRN: [id number] DOB: 1966/05/17  Today's TOC FU Call Status: Today's TOC FU Call Status:: Successful TOC FU Call Completed TOC FU Call Complete Date: 10/14/23 Patient's Name and Date of Birth confirmed.  Transition Care Management Follow-up Telephone Call Date of Discharge: 10/13/23 Discharge Facility: [facility]) Type of Discharge: Inpatient Admission Primary Inpatient Discharge Diagnosis:: hypotension How have you been since you were released from the hospital?: Better (He said overall he is okay. He explained that the gout pain was unbearable yesterday.  Today he has been shivering and has remained in bed. this afternoon he has an appointment with pulmonary.) Any questions or concerns?: No  Items Reviewed: Did you receive and understand the discharge instructions provided?: Yes Medications obtained,verified, and reconciled?: No Medications Not Reviewed Reasons:: Other: (He stated he has all of his medications and he did not have any questions about the med regime and he did not need to review the med list. He also confirmed that he has a Freestyle CGM) Any new allergies since your discharge?: No Dietary orders reviewed?: Yes Type of Diet Ordered:: heart healthy, low sodium, daibetic Do you have support at home?: Yes People in Home [RPT]: sibling(s) Name of Support/Comfort Primary Source: his brother  Medications Reviewed Today: Medications Reviewed Today   Medications were not reviewed in this encounter     Home Care and Equipment/Supplies: Were Home Health Services Ordered?: No Any new equipment or medical supplies ordered?: No  Functional Questionnaire: Do you need assistance with bathing/showering or dressing?: No Do you need assistance with meal preparation?: Yes (he has help if needed) Do you need assistance with eating?: No Do you have difficulty maintaining continence: No Do you need assistance with getting out of bed/getting  out of a chair/moving?: Yes (He has a cane to use as needed) Do you have difficulty managing or taking your medications?: No  Follow up appointments reviewed: PCP Follow-up appointment confirmed?: Yes Date of PCP follow-up appointment?: 11/06/23 Follow-up Provider: [facility] Follow-up appointment confirmed?: Yes Date of Specialist follow-up appointment?: 10/14/23 Follow-Up Specialty Provider:: pulmonary.  10/23/2023- cardiology.   10/28/2023- med onc Do you need transportation to your follow-up appointment?: No Do you understand care options if your condition(s) worsen?: Yes-patient verbalized understanding    SIGNATURE. Burnett Carson, RN

## 2023-10-14 NOTE — Telephone Encounter (Signed)
 Patient had an apt today but no showed He needs CPAP set up HST showed severe OSA, AHI 71/hour with SpO2 low 79%  Titration study in March recommend pressure 11-13cm h20  Order has been placed Patient needs to aim to wear CPAP nightly minimum 4 hours  Needs compliance check in 31-90 days

## 2023-10-14 NOTE — Telephone Encounter (Signed)
 FYI - from [facility] Call:  He said overall he is okay. He explained that the gout pain was unbearable yesterday. Today he has been shivering and has remained in bed. this afternoon he has an appointment with pulmonary.  He has all of his medications and did not have any questions for you.  He follows up with you on 11/06/2023.

## 2023-10-14 NOTE — Transitions of Care (Post Inpatient/ED Visit) (Signed)
 10/14/2023  Name: [name] MRN: [id number] DOB: December 18, 1965  Today's TOC FU Call Status: Today's TOC FU Call Status:: Successful TOC FU Call Completed TOC FU Call Complete Date: 10/14/23 Patient's Name and Date of Birth confirmed.  Transition Care Management Follow-up Telephone Call Date of Discharge: 10/13/23 Discharge Facility: [facility]) Type of Discharge: Inpatient Admission Primary Inpatient Discharge Diagnosis:: Hypotension How have you been since you were released from the hospital?: Better ("dealing with gout") Any questions or concerns?: No  Items Reviewed: Did you receive and understand the discharge instructions provided?: Yes Medications obtained,verified, and reconciled?: Partial Review Completed Reason for Partial Mediation Review: Patient did not have time to complete a medication review. He reports having all medications and is getting ready for another appointment this afternoon for his CPAP. Any new allergies since your discharge?: No Dietary orders reviewed?: Yes Type of Diet Ordered:: low sodium heart healthy Do you have support at home?: Yes People in Home [RPT]: sibling(s) Name of Support/Comfort Primary Source: Brother/Manuel  Medications Reviewed Today: Patient declined complete medication review, reports having all medications and understands instructions. Medications Reviewed Today     Reviewed by Aura Leeds, RN (Registered Nurse) on 10/14/23 at 1327  Med List Status: <None>   Medication Order Taking? Sig Documenting Provider Last Dose Status Informant  acetaminophen  (TYLENOL ) 500 MG tablet [id number] Yes Take 1,000 mg by mouth every 8 (eight) hours as needed. [provider] Taking Active Self, Pharmacy Records           Med Note Voncille Guadalajara   Fri Apr 24, 2023  7:36 PM) Patient stated he takes this medication just about everyday.  amiodarone  (PACERONE ) 200 MG tablet [id number] Yes Take 2 tablets (400 mg total) by mouth  daily. Regalado, Belkys A, MD Taking Active   apixaban  (ELIQUIS ) 5 MG TABS tablet [id number] Yes Take 1 tablet (5 mg total) by mouth 2 (two) times daily. Murleen Arms, MD Taking Active Self, Pharmacy Records  atorvastatin  (LIPITOR) 10 MG tablet [id number]  Take 1 tablet by mouth once daily  Patient taking differently: Take 10 mg by mouth at bedtime.   Lawrance Presume, MD  Active Self, Pharmacy Records  cetirizine  [facility] ALLERGY RELIEF, CETIRIZINE ,) 10 MG tablet [id number]  Take 1 tablet (10 mg total) by mouth daily. Lawrance Presume, MD  Active Self, Pharmacy Records  cholecalciferol  (VITAMIN D3) 25 MCG (1000 UNIT) tablet [id number]  Take 1,000 Units by mouth in the morning. [provider]  Active Self, Pharmacy Records  clonazePAM  (KLONOPIN ) 0.5 MG tablet [id number]  Take 1 tablet (0.5 mg total) by mouth 2 (two) times daily as needed for anxiety.  Patient taking differently: Take 0.5 mg by mouth 2 (two) times daily.   Yves Herb, MD  Active Self, Pharmacy Records  Clotrimazole  1 % OINT [id number]  Apply to the affected area twice a day  Patient taking differently: Apply 1 Application topically 2 (two) times daily as needed. Apply to the affected area twice a day   Newlin, Enobong, MD  Active Self, Pharmacy Records  colchicine  0.6 MG tablet [id number] Yes Take 0.5 tablets (0.3 mg total) by mouth daily. Regalado, Belkys A, MD Taking Active   EPINEPHrine  0.3 mg/0.3 mL IJ SOAJ injection [id number]  Inject 0.3 mg into the muscle as needed. Rochester Chuck, MD  Active Self, Pharmacy Records           Med Note Cornelia Dieter, [facility]   Fri Apr 03, 2023  4:32 PM) Active prescription, no recent need/use  famotidine  (PEPCID ) 20 MG tablet [id number]  TAKE 1 TABLET BY MOUTH 1 TO 2 TIMES DAILY AS NEEDED  Patient taking differently: Take 20 mg by mouth 2 (two) times daily. TAKE 1 TABLET BY MOUTH 1 TO 2 TIMES DAILY AS NEEDED   Ambs, Anne M, FNP  Active Self, Pharmacy Records  fluticasone  (FLONASE )  50 MCG/ACT nasal spray [id number]  Use 1-2 sprays in each nostril once a day as needed for nasal congestion. Collins Dean, NP  Active Self, Pharmacy Records  gabapentin  (NEURONTIN ) 300 MG capsule [id number] Yes Take 1 capsule (300 mg total) by mouth at bedtime. Lawrance Presume, MD Taking Active Self, Pharmacy Records           Med Note (Zollie Clemence A   Wed Oct 14, 2023  1:20 PM) Taking twice daily as directed while inpatient  insulin  aspart (NOVOLOG  FLEXPEN) 100 UNIT/ML FlexPen [id number]  Inject 10-20 Units into the skin 3 (three) times daily before meals. Regalado, Belkys A, MD  Active   insulin  glargine (LANTUS  SOLOSTAR) 100 UNIT/ML Solostar Pen [id number]  Inject 45 Units into the skin daily.  Patient taking differently: Inject 30 Units into the skin at bedtime.   Emilie Harden, MD  Active Self, Pharmacy Records  Insulin  Pen Needle (TECHLITE PEN NEEDLES) 32G X 4 MM MISC [id number]  Use to inject insulin  into the skin 3 times per day. Regalado, Belkys A, MD  Active   methocarbamol  (ROBAXIN ) 500 MG tablet [id number]  Take 1 tablet (500 mg total) by mouth daily.  Patient taking differently: Take 500 mg by mouth at bedtime.   Lawrance Presume, MD  Active Self, Pharmacy Records  metolazone  (ZAROXOLYN ) 5 MG tablet [id number]  Take 1 tablet (5 mg total) by mouth once a week. Alwin Baars, DO  Active Self, Pharmacy Records           Med Note Merlyn Starring, NICOLE   Thu Oct 08, 2023  2:37 AM) Patient takes on Monday  metoprolol  succinate (TOPROL -XL) 25 MG 24 hr tablet [id number]  Take 0.5 tablets (12.5 mg total) by mouth daily. Regalado, Belkys A, MD  Active   nystatin  (MYCOSTATIN /NYSTOP ) powder [id number]  APPLY 1 APPLICATION TOPICALLY 3 (THREE) TIMES DAILY. Lawrance Presume, MD  Active Self, Pharmacy Records  Polyethyl Glycol-Propyl Glycol (SYSTANE) 0.4-0.3 % SOLN [id number]  Place 1 drop into both eyes daily as needed (dry eyes). [provider]  Active Self, Pharmacy Records  potassium  chloride SA (KLOR-CON  M) 20 MEQ tablet [id number]  Take 1 tablet (20 mEq total) by mouth daily. Ruddy Corral M, PA-C  Active   sertraline  (ZOLOFT ) 50 MG tablet [id number]  Take 1.5 tablets (75 mg total) by mouth in the morning. Yves Herb, MD  Active Self, Pharmacy Records  SYMBICORT  160-4.5 MCG/ACT inhaler [id number]  Inhale 2 puffs into the lungs daily. [provider]  Active Self, Pharmacy Records  tamsulosin  (FLOMAX ) 0.4 MG CAPS capsule [id number]  Take 2 capsules by mouth once daily Lawrance Presume, MD  Active Self, Pharmacy Records  torsemide  (DEMADEX ) 20 MG tablet [id number]  Take 1 tablet (20 mg total) by mouth daily. Regalado, Belkys A, MD  Active             Home Care and Equipment/Supplies: Were Home Health Services Ordered?: No Any new equipment or medical supplies ordered?: No  Functional Questionnaire: Do you need assistance with bathing/showering or dressing?: No Do you need assistance with  meal preparation?: No Do you need assistance with eating?: No Do you have difficulty maintaining continence: No Do you need assistance with getting out of bed/getting out of a chair/moving?: No Do you have difficulty managing or taking your medications?: No  Follow up appointments reviewed: PCP Follow-up appointment confirmed?: Yes Date of PCP follow-up appointment?: 11/06/23 Follow-up Provider: Dr. Lincoln Renshaw [facility] Follow-up appointment confirmed?: Yes Date of Specialist follow-up appointment?: 10/14/23 Follow-Up Specialty Provider:: 10/14/23 with Pulmonology and 10/23/23 with Cardiology Do you need transportation to your follow-up appointment?: No Do you understand care options if your condition(s) worsen?: Yes-patient verbalized understanding    Arna Better RN, BSN [place]  Value-Based Care [facility] RN Care Manager [phone]

## 2023-10-15 ENCOUNTER — Other Ambulatory Visit: Payer: Self-pay | Admitting: Family Medicine

## 2023-10-16 NOTE — Telephone Encounter (Signed)
 Pt called me back. I spoke to pt and informed him of Beth's note. Pt verbalized understanding. I made sure to let pt know that once pt receives his CPAP, he need to call our office to schedule the f/u appointment 31 days or more for compliance. NFN

## 2023-10-16 NOTE — Telephone Encounter (Signed)
ATC x1.  LMTCB. 

## 2023-10-19 ENCOUNTER — Encounter: Attending: Cardiology

## 2023-10-19 DIAGNOSIS — Z95810 Presence of automatic (implantable) cardiac defibrillator: Secondary | ICD-10-CM | POA: Insufficient documentation

## 2023-10-19 DIAGNOSIS — I5022 Chronic systolic (congestive) heart failure: Secondary | ICD-10-CM | POA: Insufficient documentation

## 2023-10-20 ENCOUNTER — Telehealth: Payer: Self-pay | Admitting: Primary Care

## 2023-10-20 NOTE — Telephone Encounter (Signed)
 Rc'd fax from [facility] for CPAP supplies. Sending to Mms. Sueanne Emerald for [employer].

## 2023-10-20 NOTE — Progress Notes (Signed)
 EPIC Encounter for ICM Monitoring  Patient Name: [name] is a 58 y.o. male Date: 10/20/2023 Primary Care Physican: Lawrance Presume, MD Primary Cardiologist: Crenshaw/[name] Electrophysiologist: Marven Slimmer 01/07/2023 Weight: 280 lbs 02/13/2023 Weight: 297 lbs 03/25/2023 Weight: 297 lbs    06/12/2023 Weight: 289 lbs   07/16/2023 Weight: Not weighing   07/28/2023 Weight: 284 lbs  08/24/2023 Office Weight: 295 lbs    09/28/2023 Weight: 291 lbs       10/06/2023 Weight: unknown lbs   10/11/2023 Weight: 279.5 lbs hospital weight 10/12/2023 Weight: 279.9 lbs hospital weight 10/13/2023  Weight: 280.2 lbs discharge weight 10/20/2023 Weight:  Not weighing at home      Spoke with patient and heart failure questions reviewed.  Transmission results reviewed.  Pt reports he is feeling better since 5/6 hospital discharge. He denies any fluid symptoms.   He was instructed at discharge to drink more water.     [facility] thoracic impedance suggesting possible fluid accumulation starting 5/8 (following 5/6 hospital discharge) but trending back toward baseline.   Prescribed dosage:  Torsemide  20 mg take 1 tablet(s) (20 mg total) by mouth daily (discharge dosage) Potassium 20 mEq take 1 tablets (20 mEq total) by mouth every day. (Discharge dosage)    Metolazone  5 mg take 1 tablet by mouth once a week (Nephrologist said he could take it up to twice a week).     Labs: 10/13/2023 Creatinine 2.36, BUN 64, Potassium 4.1, Sodium 138, GFR 31  10/12/2023 Creatinine 2.62, BUN 78, Potassium 3.5, Sodium 135, GFR 27  10/11/2023 Creatinine 2.78, BUN 86, Potassium 3.1, Sodium 135, GFR 26  10/10/2023 Creatinine 3.05, BUN 88, Potassium 3.4, Sodium 137, GFR 23 10/09/2023 Creatinine 4.58, BUN 83, Potassium 2.8, Sodium 131, GFR 14  10/07/2023 Creatinine 4.56, BUN 77, Potassium 3.3, Sodium 133, GFR 14  09/07/2023 Creatinine 2.71, BUN 23, Potassium 4.4, Sodium 139, GFR 26 07/30/2023 Creatinine 2.74, BUN 36, Potassium 4.6, Sodium  139, GFR 26 05/21/2023 Creatinine 2.56, BUN 27, Potassium 4.1, Sodium 138, GFR 28  05/13/2023 Creatinine 2.75, BUN 23, Potassium 3.9, Sodium 138, GFR 26  A complete set of results can be found in Results Review.   Recommendations:  Advised importance of recording daily weights, be consistent with fluid intake of 64 oz or less daily.  He confirmed he will be at 5/16 HF clinic office visit.     Follow-up plan: ICM clinic phone appointment on 10/27/2023 to recheck fluid levels.   91 day device clinic remote transmission 11/18/2023.     EP/Cardiology Office Visits:  10/23/2023 with HF clinic.   Recall 11/02/2023 with Dr Marven Slimmer or [name], PA (6 month).  Recall 12/22/2023 with [name].   Copy of ICM check sent to Dr Marven Slimmer.  3 month ICM trend: 10/19/2023.    12-14 Month ICM trend:     [name], RN 10/20/2023 1:09 PM

## 2023-10-22 ENCOUNTER — Telehealth (HOSPITAL_COMMUNITY): Payer: Self-pay | Admitting: *Deleted

## 2023-10-22 NOTE — Telephone Encounter (Signed)
 Called to confirm/remind patient of their appointment [facility] on 10/23/23.      Appointment:              [x] Confirmed             [] Left mess              [] No answer/No voice mail             [] Phone not in service   Patient reminded to bring all medications and/or complete list.   Confirmed patient has transportation. Gave directions, instructed to utilize valet parking.

## 2023-10-22 NOTE — Telephone Encounter (Signed)
 Rc'd signed copy will fax and confirm.

## 2023-10-23 ENCOUNTER — Ambulatory Visit (HOSPITAL_COMMUNITY): Payer: Self-pay | Admitting: Family Medicine

## 2023-10-23 ENCOUNTER — Encounter (HOSPITAL_COMMUNITY): Payer: Self-pay

## 2023-10-23 ENCOUNTER — Ambulatory Visit (HOSPITAL_COMMUNITY)
Admit: 2023-10-23 | Discharge: 2023-10-23 | Disposition: A | Discharge status: Home / Self Care | Attending: Family Medicine | Admitting: Family Medicine

## 2023-10-23 VITALS — BP 124/78 | HR 73 | Ht 73.0 in | Wt 290.8 lb

## 2023-10-23 DIAGNOSIS — N1832 Chronic kidney disease, stage 3b: Secondary | ICD-10-CM | POA: Diagnosis not present

## 2023-10-23 DIAGNOSIS — Z794 Long term (current) use of insulin: Secondary | ICD-10-CM | POA: Insufficient documentation

## 2023-10-23 DIAGNOSIS — I5022 Chronic systolic (congestive) heart failure: Secondary | ICD-10-CM | POA: Insufficient documentation

## 2023-10-23 DIAGNOSIS — N183 Chronic kidney disease, stage 3 unspecified: Secondary | ICD-10-CM

## 2023-10-23 DIAGNOSIS — I472 Ventricular tachycardia, unspecified: Secondary | ICD-10-CM | POA: Diagnosis not present

## 2023-10-23 DIAGNOSIS — G4733 Obstructive sleep apnea (adult) (pediatric): Secondary | ICD-10-CM | POA: Diagnosis not present

## 2023-10-23 DIAGNOSIS — J449 Chronic obstructive pulmonary disease, unspecified: Secondary | ICD-10-CM | POA: Diagnosis not present

## 2023-10-23 DIAGNOSIS — Z7901 Long term (current) use of anticoagulants: Secondary | ICD-10-CM | POA: Diagnosis not present

## 2023-10-23 DIAGNOSIS — E1122 Type 2 diabetes mellitus with diabetic chronic kidney disease: Secondary | ICD-10-CM | POA: Diagnosis not present

## 2023-10-23 DIAGNOSIS — Z79899 Other long term (current) drug therapy: Secondary | ICD-10-CM | POA: Diagnosis not present

## 2023-10-23 DIAGNOSIS — Z86718 Personal history of other venous thrombosis and embolism: Secondary | ICD-10-CM | POA: Insufficient documentation

## 2023-10-23 LAB — COMPREHENSIVE METABOLIC PANEL WITH GFR
ALT: 22 U/L (ref 0–44)
AST: 21 U/L (ref 15–41)
Albumin: 3.6 g/dL (ref 3.5–5.0)
Alkaline Phosphatase: 51 U/L (ref 38–126)
Anion gap: 13 (ref 5–15)
BUN: 15 mg/dL (ref 6–20)
CO2: 22 mmol/L (ref 22–32)
Calcium: 9.3 mg/dL (ref 8.9–10.3)
Chloride: 106 mmol/L (ref 98–111)
Creatinine, Ser: 2.27 mg/dL — ABNORMAL HIGH (ref 0.61–1.24)
GFR, Estimated: 32 mL/min — ABNORMAL LOW (ref >60)
Glucose, Bld: 155 mg/dL — ABNORMAL HIGH (ref 70–99)
Potassium: 3.5 mmol/L (ref 3.5–5.1)
Sodium: 141 mmol/L (ref 135–145)
Total Bilirubin: 0.6 mg/dL (ref 0.0–1.2)
Total Protein: 7.1 g/dL (ref 6.5–8.1)

## 2023-10-23 LAB — CBC
HCT: 35.4 % — ABNORMAL LOW (ref 39.0–52.0)
Hemoglobin: 11.5 g/dL — ABNORMAL LOW (ref 13.0–17.0)
MCH: 29.6 pg (ref 26.0–34.0)
MCHC: 32.5 g/dL (ref 30.0–36.0)
MCV: 91 fL (ref 80.0–100.0)
Platelets: 289 10*3/uL (ref 150–400)
RBC: 3.89 MIL/uL — ABNORMAL LOW (ref 4.22–5.81)
RDW: 15.2 % (ref 11.5–15.5)
WBC: 5 10*3/uL (ref 4.0–10.5)
nRBC: 0.6 % — ABNORMAL HIGH (ref 0.0–0.2)

## 2023-10-23 LAB — BRAIN NATRIURETIC PEPTIDE: B Natriuretic Peptide: 25.5 pg/mL (ref 0.0–100.0)

## 2023-10-23 LAB — TSH: TSH: 1.428 u[IU]/mL (ref 0.350–4.500)

## 2023-10-23 MED ORDER — TORSEMIDE 20 MG PO TABS: ORAL_TABLET | ORAL | 3 refills | Status: DC

## 2023-10-23 MED ORDER — HYDRALAZINE HCL 25 MG PO TABS: 12.5000 mg | ORAL_TABLET | Freq: Three times a day (TID) | ORAL | 6 refills | Status: DC

## 2023-10-23 NOTE — Patient Instructions (Signed)
 Start hydralazine  12.5 (1/2 tablet) three times daily. Increase torsemide  to 20 mg daily except take 40 mg on Mon/Wed/Fri - updated Rx sent. Labs today - will call you if abnormal. Repeat labs in 7 - 10 days. See below. Call your EP doctor and schedule your follow up, please. Return to see Dr. Bruce Caper in 4 months. PLEASE CALL US  AT [phone] IN AUGUST TO SCHEDULE THIS APPOINTMENT.  Please call us  at [phone] if any questions or concern prior to your next appointment.

## 2023-10-23 NOTE — Progress Notes (Signed)
 ReDS Vest / Clip - 10/23/23 1100       ReDS Vest / Clip   Station Marker D    Ruler Value 39.5    ReDS Value Range Low volume    ReDS Actual Value 33

## 2023-10-23 NOTE — Progress Notes (Signed)
 ADVANCED HEART FAILURE CLINIC NOTE  Primary Care: Lawrance Presume, MD Primary Cardiologist: Dr. Audery Blazing EP: Dr. Marven Slimmer Pulm: Dr. Dione Franks Nephrology: Dr. Yvonnie Heritage HF Cardiologist: Dr. Bruce Caper   HPI: [name] is a 58 y.o. male with HFrEF (dx 2015) w/ LHC demonstrating normal coronary anatomy s/p primary prevention ICD, CKDIIIB & T2DM. He has been followed outpatient by Dr. Audery Blazing. In 2017 he had a CPX with w/ moderate functional impairment, peak VO2 of 15, VE/VCO2 of 35. Outpatient he has been on a stable regimen of Entresto /coreg /farxiga . From a functional standpoint, [name] reports running 2-3 miles daily until he was diagnosed with 'long covid." Since that time he has seen a sharp decline in exercise capacity. In November 2023, he was admitted for VT believed to be secondary to significant electrolyte derrangements from and underlying right gluteal abscess now s/p IV antibiotics. Throughout his admission he had recurrent device shocks for VT. He underwent RHC/LHC that demonstrated normal coronary anatomy with RHC showing normal cardiac output and low filling pressures. He subsequently underwent placement of right atrial lead with improvement in ectopy after atrial pacing. TTE during admission with LVEF of 30-35%.   Echo 3/24 EF 20-25%, moderate LVH, RV okay.    Admitted 10/24 with groin cellulitis. Treated with IV antibiotics. He is now off Farxiga .   Readmitted 11/24 with extensive left upper extremity DVT. He was started on Eliquis . Echo 11/24: EF 25-30%.   Echo 3/25: EF up to 40-45%, RV okay   Seen in ED 09/07/23 with RSV infection. A week later given course of doxycycline  d/t persistent symptoms.  Admitted 5/25 with AKI 2/2 decrease oral intake. SCr peaked at 4.6 (blaseline 2.7). GDMT held and received IVF. He was restarted on Toprol  and torsemide , discharged home, weight 279 lbs.  Today he returns for post hospital HF follow up. Overall feeling tired and worn out.  Awaiting CPAP. He has SOB showering and walking short distances on flat ground. He attributes this to his COPD. Rare atypical CP. He has dizziness randomly, no falls. Left hand swelling. Denies palpitations, abnormal bleeding, or PND.  Chronically sleeps on 6 pillows. Appetite ok. Weight at home 285 pounds. Taking all medications. No ETOH, tobacco, drug use.    Current Outpatient Medications  Medication Sig Dispense Refill   acetaminophen  (TYLENOL ) 500 MG tablet Take 1,000 mg by mouth every 8 (eight) hours as needed.     amiodarone  (PACERONE ) 200 MG tablet Take 2 tablets (400 mg total) by mouth daily. 180 tablet 3   apixaban  (ELIQUIS ) 5 MG TABS tablet Take 1 tablet (5 mg total) by mouth 2 (two) times daily. 60 tablet 3   atorvastatin  (LIPITOR) 10 MG tablet Take 1 tablet by mouth once daily (Patient taking differently: Take 10 mg by mouth at bedtime.) 90 tablet 0   cetirizine  (EQ ALLERGY RELIEF, CETIRIZINE ,) 10 MG tablet Take 1 tablet (10 mg total) by mouth daily. 90 tablet 1   cholecalciferol  (VITAMIN D3) 25 MCG (1000 UNIT) tablet Take 1,000 Units by mouth in the morning.     [START ON 10/30/2023] clonazePAM  (KLONOPIN ) 0.5 MG tablet Take 1 tablet (0.5 mg total) by mouth 2 (two) times daily as needed for anxiety. (Patient taking differently: Take 0.5 mg by mouth 2 (two) times daily.) 60 tablet 1   Clotrimazole  1 % OINT Apply to the affected area twice a day (Patient taking differently: Apply 1 Application topically 2 (two) times daily as needed. Apply to the affected area twice a day)  56.7 g 0   colchicine  0.6 MG tablet Take 0.5 tablets (0.3 mg total) by mouth daily. 30 tablet 1   famotidine  (PEPCID ) 20 MG tablet TAKE 1 TABLET BY MOUTH 1 TO 2 TIMES DAILY AS NEEDED 180 tablet 0   fluticasone  (FLONASE ) 50 MCG/ACT nasal spray Use 1-2 sprays in each nostril once a day as needed for nasal congestion. 16 g 5   gabapentin  (NEURONTIN ) 300 MG capsule Take 1 capsule (300 mg total) by mouth at bedtime. 90 capsule  1   insulin  aspart (NOVOLOG  FLEXPEN) 100 UNIT/ML FlexPen Inject 10-20 Units into the skin 3 (three) times daily before meals. 60 mL 3   insulin  glargine (LANTUS  SOLOSTAR) 100 UNIT/ML Solostar Pen Inject 45 Units into the skin daily. (Patient taking differently: Inject 30 Units into the skin at bedtime.) 45 mL 3   Insulin  Pen Needle (TECHLITE PEN NEEDLES) 32G X 4 MM MISC Use to inject insulin  into the skin 3 times per day. 100 each 0   methocarbamol  (ROBAXIN ) 500 MG tablet Take 1 tablet (500 mg total) by mouth daily. (Patient taking differently: Take 500 mg by mouth at bedtime.) 90 tablet 1   metolazone  (ZAROXOLYN ) 5 MG tablet Take 1 tablet (5 mg total) by mouth once a week.     metoprolol  succinate (TOPROL -XL) 25 MG 24 hr tablet Take 0.5 tablets (12.5 mg total) by mouth daily. 30 tablet 3   nystatin  (MYCOSTATIN /NYSTOP ) powder APPLY 1 APPLICATION TOPICALLY 3 (THREE) TIMES DAILY. 60 g 0   Polyethyl Glycol-Propyl Glycol (SYSTANE) 0.4-0.3 % SOLN Place 1 drop into both eyes daily as needed (dry eyes).     potassium chloride  SA (KLOR-CON  M) 20 MEQ tablet Take 1 tablet (20 mEq total) by mouth daily. 30 tablet 0   sertraline  (ZOLOFT ) 50 MG tablet Take 1.5 tablets (75 mg total) by mouth in the morning. 45 tablet 2   SYMBICORT  160-4.5 MCG/ACT inhaler Inhale 2 puffs into the lungs daily.     tamsulosin  (FLOMAX ) 0.4 MG CAPS capsule Take 2 capsules by mouth once daily 180 capsule 0   torsemide  (DEMADEX ) 20 MG tablet Take 1 tablet (20 mg total) by mouth daily. 30 tablet 3   EPINEPHrine  0.3 mg/0.3 mL IJ SOAJ injection Inject 0.3 mg into the muscle as needed. (Patient not taking: Reported on 10/23/2023) 1 each 1   No current facility-administered medications for this encounter.   Wt Readings from Last 3 Encounters:  10/23/23 131.9 kg (290 lb 12.8 oz)  10/13/23 127.1 kg (280 lb 1.6 oz)  09/07/23 128.8 kg (283 lb 15.2 oz)   BP 124/78   Pulse 73   Ht 6\' 1"  (1.854 m)   Wt 131.9 kg (290 lb 12.8 oz)   SpO2 94%    BMI 38.37 kg/m    PHYSICAL EXAM: General:  NAD. No resp difficulty, walked into clinic, fatigued-appearing HEENT: Normal Neck: Supple. Thick neck but JVP 10 Cor: Regular rate & rhythm. No rubs, gallops or murmurs. Lungs: Clear, crackles bilateral bases Abdomen: Soft, obese, nontender, nondistended.  Extremities: No cyanosis, clubbing, rash, edema Neuro: Alert & oriented x 3, moves all 4 extremities w/o difficulty. Affect pleasant.  ReDs reading: 33 %, normal  DATA REVIEW Device interrogation (personally reviewed): CorVue suggests hypervolemia,no AF or VT, 16% AP  ECG: 10/23/23: NSR 74 bpm (personally reviewed)  ECHO: 09/03/23: EF 40-45%, moderate LVH, normal RV 04/26/23 LVEF 25%-30%, normal RV function.  08/22/2022: LVEF 35%, normal RV function 05/06/2022: LVEF 30-35%, normal RV function.  02/16/19:  LVEF 45-50%, normal RV function.   CATH: 05/12/22: HEMODYNAMICS: RA:                  1 mmHg (mean) RV:                  22/1-3 mmHg PA:                  23/6 mmHg (12 mean) PCWP:            4 mmHg (mean)                                      Estimated Fick CO/CI   7 L/min, 2.9 L/min/m2                                                 TPG                 8  mmHg                                              PVR                 ~1 Wood Units  PAPi                >5       IMPRESSION: Low pre and post capillary filling pressures.  Normal cardiac output/cardiac index.  Normal PVR & PA mean No obstructive CAD, left dominant w/ large Lcx supplying multiple large marginals.    ASSESSMENT & PLAN:  Heart failure with reduced EF, Stage D Etiology of HQ:[id number] as demonstrated by coronary angiography; no family history. If he continues to have recurrent VT will consider evaluation for sarcoidosis. Device interrogation w/o arrhythmias NYHA class / AHA Stage: III-IIIb, suspect deconditioning and COPD contributing.  Volume status & Diuretics: Volume up today on exam and OptiVol,  though ReDs ok at 33%. He has fairly advanced HF; high risk for re-admission.  - Increase torsemide  to 40 mg MWF, 20 mg other days.  - Continue metolazone  2.5 mg on Mondays. Vasodilators: Off Entresto  with CKD; start hydralazine  12.5 mg tid Beta-Blocker: Continue Toprol  12.5 mg daily. MRA: Off spiro with CKD   Cardiometabolic: Off SGLT2i w/ groin cellulitis Devices therapies & Valvulopathies: ICD  Advanced therapies: CPX in 2017 peak VO2 of 15 AND ve/vco2 of 35 but with submaximal effort; consistent with moderate HF limitations. Advanced therapies at this time limited by CKD and social barriers.  - BMET and BNP today; repeat BMET in 1 week  2. VT - Multiple runs of NSVT initially during recent admission, in setting of low K  - Continue amiodarone  400 mg daily. Plan to drop dose next visit if VT quiescent - Check LFTs, lytes and TSH today - Will need regular eye exam while on amio - Due for EP follow up soon - No VT on device interrogation today as above   3. DM II - Uncontrolled A1c > 9 - Avoid SGLT2i with hx of groin infections - Per PCP  4. CKD IV  - Baseline SCr 2.5-2.7 - Renal ultrasound unremarkable -  labs today   5. Hx DVT - Continue Eliquis  5 mg bid, no bleeding issues - CBC today  6. OSA - severe by sleep study - awaiting CPAP  7. COPD - Follows with Dr. Dione Franks - On Symbicort   Follow up in 3-4 months with Dr. Sanjuanita Cruz, FNP-BC 10/23/23

## 2023-10-26 ENCOUNTER — Encounter

## 2023-10-27 ENCOUNTER — Telehealth: Payer: Self-pay

## 2023-10-27 ENCOUNTER — Ambulatory Visit: Attending: Cardiology

## 2023-10-27 DIAGNOSIS — Z95810 Presence of automatic (implantable) cardiac defibrillator: Secondary | ICD-10-CM

## 2023-10-27 DIAGNOSIS — I5022 Chronic systolic (congestive) heart failure: Secondary | ICD-10-CM

## 2023-10-27 NOTE — Progress Notes (Signed)
 EPIC Encounter for ICM Monitoring  Patient Name: [name] is a 58 y.o. male Date: 10/27/2023 Primary Care Physican: Lawrance Presume, MD Primary Cardiologist: Crenshaw/[name] Electrophysiologist: Marven Slimmer 01/07/2023 Weight: 280 lbs 02/13/2023 Weight: 297 lbs 03/25/2023 Weight: 297 lbs    06/12/2023 Weight: 289 lbs   07/16/2023 Weight: Not weighing   07/28/2023 Weight: 284 lbs  08/24/2023 Office Weight: 295 lbs    09/28/2023 Weight: 291 lbs       10/06/2023 Weight: unknown lbs   10/11/2023 Weight: 279.5 lbs hospital weight 10/12/2023 Weight: 279.9 lbs hospital weight 10/13/2023  Weight: 280.2 lbs discharge weight 10/20/2023 Weight:  Not weighing at home      Attempted call to patient and unable to reach.  Transmission results reviewed.  ReDs at 5/16 HF clinic visit was 33%   Corvue thoracic impedance suggesting possible fluid accumulation starting 5/8 (following 5/6 hospital discharge) but trending back toward baseline.   Prescribed dosage:  Torsemide  20 mg take 1 tablet(s) (20 mg total) by mouth daily except take 2 tablets (40 mg total) on MWF  Potassium 20 mEq take 1 tablet(s) (20 mEq total) by mouth every day.  Metolazone  5 mg take 1 tablet by mouth once a week.  Pt takes on Mondays (Nephrologist said he could take it up to twice a week).     Labs: 11/03/2023 BMET scheduled at HF clinic 10/23/2023 Creatinine 2.27, BUN 15, Potassium 3.5, Sodium 141, GFR 32 10/13/2023 Creatinine 2.36, BUN 64, Potassium 4.1, Sodium 138, GFR 31  10/12/2023 Creatinine 2.62, BUN 78, Potassium 3.5, Sodium 135, GFR 27  10/11/2023 Creatinine 2.78, BUN 86, Potassium 3.1, Sodium 135, GFR 26  10/10/2023 Creatinine 3.05, BUN 88, Potassium 3.4, Sodium 137, GFR 23 10/09/2023 Creatinine 4.58, BUN 83, Potassium 2.8, Sodium 131, GFR 14  10/07/2023 Creatinine 4.56, BUN 77, Potassium 3.3, Sodium 133, GFR 14  09/07/2023 Creatinine 2.71, BUN 23, Potassium 4.4, Sodium 139, GFR 26 07/30/2023 Creatinine 2.74, BUN 36,  Potassium 4.6, Sodium 139, GFR 26 05/21/2023 Creatinine 2.56, BUN 27, Potassium 4.1, Sodium 138, GFR 28  05/13/2023 Creatinine 2.75, BUN 23, Potassium 3.9, Sodium 138, GFR 26  A complete set of results can be found in Results Review.   Recommendations:  Unable to reach.    Will send copy to [name], NP for review and recommendations if patient is reached.   Follow-up plan: ICM clinic phone appointment on 11/03/2023 to recheck fluid levels.   91 day device clinic remote transmission 11/18/2023.     EP/Cardiology Office Visits:    Recall 11/02/2023 with Dr Marven Slimmer or [name], PA (6 month).  Recall 12/22/2023 with [name].   Copy of ICM check sent to Dr Marven Slimmer.   3 month ICM trend: 10/27/2023.    12-14 Month ICM trend:     [name], RN 10/27/2023 7:08 AM

## 2023-10-27 NOTE — Telephone Encounter (Signed)
 Called and verbally confirmed appointment for 5/21

## 2023-10-27 NOTE — Telephone Encounter (Signed)
 Remote ICM transmission received.  Attempted call to patient regarding ICM remote transmission and no answer.

## 2023-10-28 ENCOUNTER — Inpatient Hospital Stay: Payer: Medicaid Other | Attending: Hematology and Oncology | Admitting: Hematology and Oncology

## 2023-10-28 VITALS — BP 107/65 | HR 94 | Temp 98.7°F | Resp 19

## 2023-10-28 DIAGNOSIS — Z87891 Personal history of nicotine dependence: Secondary | ICD-10-CM | POA: Diagnosis not present

## 2023-10-28 DIAGNOSIS — Z79899 Other long term (current) drug therapy: Secondary | ICD-10-CM | POA: Diagnosis not present

## 2023-10-28 DIAGNOSIS — I82622 Acute embolism and thrombosis of deep veins of left upper extremity: Secondary | ICD-10-CM | POA: Insufficient documentation

## 2023-10-28 NOTE — Progress Notes (Signed)
 [facility] CONSULT NOTE  Patient Care Team: Lawrance Presume, MD as PCP - General (Internal Medicine) Audery Blazing Deannie Fabian, MD as PCP - Cardiology (Cardiology) Boyce Byes, MD as PCP - Electrophysiology (Cardiology) Hunt Magyar as Social Worker  CHIEF COMPLAINTS/PURPOSE OF CONSULTATION:  LU extremity DVT  ASSESSMENT & PLAN:   Upper Extremity Deep Vein Thrombosis (UEDVT) First episode of UEDVT in November, possibly provoked by IV access during hospitalization in October, uncertain which arm was used.  He has been taking Eliquis  as prescribed.  He was recently hospitalized and is here for hospital follow-up.  He complains of some increased swelling in the left upper extremity despite continuing Eliquis .  This is not very noticeable on exam.  We have however discussed about repeating ultrasound.  He did not have any new chest pain or shortness of breath.  His chest pain and shortness of breath that he complained of is something he has had at baseline and he has several cardiac issues.  He went to see his cardiologist as well recently after hospitalization.  Plan was to repeat ultrasound, continue Eliquis  in the interim.  With regards to the chest pain and shortness of breath, despite he reassured me that this is his baseline given his cardiac history, recommended that he go to the nearest emergency room with any of these complaints.  He did not want to do this.  He understands the importance of prompt care with cardiac issues.  I think the feeling faint is likely because he did not eat for the day.  He felt better after this.  His vital signs were perfectly normal.  He will return to clinic in about 6 months.  I have ordered a repeat ultrasound and we will get this scheduled for him   HISTORY OF PRESENTING ILLNESS:  [name] 58 y.o. male is here because of left upper extremity DVT  Discussed the use of AI scribe software for clinical note transcription with  the patient, who gave verbal consent to proceed.  History of Present Illness   He is here for follow up.  He was recently hospitalized for hypotension and discharge.  He first arrived to the appointment.,  He felt a bit short of breath complained of some chest pain, feeling faint and he did not eat for the day.  We gave him some pretzels and some water and after he ate, he felt much better.  His blood pressure was normal again.  He really did not want to go to the ER for his symptoms.  He says he always has some baseline chest pain and shortness of breath and he has recently seen his cardiologist who was pleased with his progress.  He then complained of some swelling in the left upper extremity.  Today he he has been taking his Eliquis  as prescribed.  No bleeding complaints. Rest of the pertinent ROS reviewed and neg.  MEDICAL HISTORY:  Past Medical History:  Diagnosis Date   AICD (automatic cardioverter/defibrillator) present    Chronic bronchitis (HCC)    "get it most q yr" (12/23/2013)   Chronic systolic (congestive) heart failure (HCC)    Fracture of left humerus    a. 07/2013.   GERD (gastroesophageal reflux disease)    not at present time   Heart murmur    "born w/it"    History of renal calculi    Hypertension    Kidney stones    NICM (nonischemic cardiomyopathy) (HCC)  a. 07/2013 Echo: EF 20-25%, diff HK, Gr2 DD, mild MR, mod dil LA/RA. 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    Shockable heart rhythm detected by automated external defibrillator    Syncope    Type II diabetes mellitus (HCC) 2006    SURGICAL HISTORY: Past Surgical History:  Procedure Laterality Date   CARDIAC CATHETERIZATION  08/10/13   CHOLECYSTECTOMY  ~ 2010   IMPLANTABLE CARDIOVERTER DEFIBRILLATOR IMPLANT  12/23/2013   STJ Fortify ICD implanted by Dr Nunzio Belch for cardiomyopathy and syncope   IMPLANTABLE CARDIOVERTER DEFIBRILLATOR  IMPLANT N/A 12/23/2013   Procedure: IMPLANTABLE CARDIOVERTER DEFIBRILLATOR IMPLANT;  Surgeon: Ellaree Gunther, MD;  Location: [facility] CATH LAB;  Service: Cardiovascular;  Laterality: N/A;   INGUINAL HERNIA REPAIR Left 03/01/2018   Procedure: OPEN REPAIR OF LEFT INGUINAL HERNIA WITH MESH;  Surgeon: Aldean Hummingbird, MD;  Location: [facility] ORS;  Service: General;  Laterality: Left;   LEAD REVISION/REPAIR N/A 05/15/2022   Procedure: LEAD REVISION/REPAIR;  Surgeon: Boyce Byes, MD;  Location: [facility] INVASIVE CV LAB;  Service: Cardiovascular;  Laterality: N/A;   LEFT HEART CATHETERIZATION WITH CORONARY ANGIOGRAM N/A 08/10/2013   Procedure: LEFT HEART CATHETERIZATION WITH CORONARY ANGIOGRAM;  Surgeon: Lucendia Rusk, MD;  Location: [facility] CATH LAB;  Service: Cardiovascular;  Laterality: N/A;   ORIF HUMERUS FRACTURE Left 08/12/2013   Procedure: OPEN REDUCTION INTERNAL FIXATION (ORIF) HUMERAL SHAFT FRACTURE;  Surgeon: Timothy Ford, MD;  Location: [facility] OR;  Service: Orthopedics;  Laterality: Left;  Open Reduction Internal Fixation Left Humerus   RIGHT/LEFT HEART CATH AND CORONARY ANGIOGRAPHY N/A 05/12/2022   Procedure: RIGHT/LEFT HEART CATH AND CORONARY ANGIOGRAPHY;  Surgeon: Alwin Baars, DO;  Location: [facility] INVASIVE CV LAB;  Service: Cardiovascular;  Laterality: N/A;   URETEROSCOPY     "laser for kidney stones"    SOCIAL HISTORY: Social History   Socioeconomic History   Marital status: Single    Spouse name: Not on file   Number of children: Not on file   Years of education: Not on file   Highest education level: Not on file  Occupational History   Occupation: Disabled  Tobacco Use   Smoking status: Former    Current packs/day: 0.00    Average packs/day: 1 pack/day for 28.0 years (28.0 ttl pk-yrs)    Types: Cigarettes    Start date: 06/03/1986    Quit date: 06/03/2014    Years since quitting: 9.4   Smokeless tobacco: Never  Vaping Use   Vaping status: Never Used  Substance and Sexual Activity    Alcohol use: Not Currently   Drug use: Yes    Types: Marijuana    Comment: occasionally;   Sexual activity: Yes  Other Topics Concern   Not on file  Social History Narrative   Pt lives with his brother    Social Drivers of Health   Financial Resource Strain: Low Risk  (05/25/2023)   Overall Financial Resource Strain (CARDIA)    Difficulty of Paying Living Expenses: Not hard at all  Recent Concern: Financial Resource Strain - High Risk (03/24/2023)   Overall Financial Resource Strain (CARDIA)    Difficulty of Paying Living Expenses: Hard  Food Insecurity: No Food Insecurity (10/08/2023)   Hunger Vital Sign    Worried About Running Out of Food in the Last Year: Never true    Ran Out of Food in the Last Year: Never true  Transportation Needs: No  Transportation Needs (10/08/2023)   PRAPARE - Administrator, Civil Service (Medical): No    Lack of Transportation (Non-Medical): No  Physical Activity: Inactive (05/25/2023)   Exercise Vital Sign    Days of Exercise per Week: 0 days    Minutes of Exercise per Session: 0 min  Stress: Stress Concern Present (05/25/2023)   [employer] of Occupational Health - Occupational Stress Questionnaire    Feeling of Stress : Rather much  Social Connections: Moderately Integrated (10/08/2023)   Social Connection and Isolation Panel [NHANES]    Frequency of Communication with Friends and Family: More than three times a week    Frequency of Social Gatherings with Friends and Family: More than three times a week    Attends Religious Services: More than 4 times per year    Active Member of [employer] or Organizations: Yes    Attends Banker Meetings: 1 to 4 times per year    Marital Status: Never married  Intimate Partner Violence: Not At Risk (10/08/2023)   Humiliation, Afraid, Rape, and Kick questionnaire    Fear of Current or Ex-Partner: No    Emotionally Abused: No    Physically Abused: No    Sexually Abused: No    FAMILY  HISTORY: Family History  Problem Relation Age of Onset   Diabetes Father    Arthritis Father    Hypertension Father    Diabetes Mother    Arthritis Mother    Hypertension Mother    Hypertension Brother     ALLERGIES:  is allergic to bee venom and trulicity  [dulaglutide ].  MEDICATIONS:  Current Outpatient Medications  Medication Sig Dispense Refill   acetaminophen  (TYLENOL ) 500 MG tablet Take 1,000 mg by mouth every 8 (eight) hours as needed.     amiodarone  (PACERONE ) 200 MG tablet Take 2 tablets (400 mg total) by mouth daily. 180 tablet 3   apixaban  (ELIQUIS ) 5 MG TABS tablet Take 1 tablet (5 mg total) by mouth 2 (two) times daily. 60 tablet 3   atorvastatin  (LIPITOR) 10 MG tablet Take 1 tablet by mouth once daily (Patient taking differently: Take 10 mg by mouth at bedtime.) 90 tablet 0   cetirizine  (EQ ALLERGY RELIEF, CETIRIZINE ,) 10 MG tablet Take 1 tablet (10 mg total) by mouth daily. 90 tablet 1   cholecalciferol  (VITAMIN D3) 25 MCG (1000 UNIT) tablet Take 1,000 Units by mouth in the morning.     [START ON 10/30/2023] clonazePAM  (KLONOPIN ) 0.5 MG tablet Take 1 tablet (0.5 mg total) by mouth 2 (two) times daily as needed for anxiety. (Patient taking differently: Take 0.5 mg by mouth 2 (two) times daily.) 60 tablet 1   Clotrimazole  1 % OINT Apply to the affected area twice a day (Patient taking differently: Apply 1 Application topically 2 (two) times daily as needed. Apply to the affected area twice a day) 56.7 g 0   colchicine  0.6 MG tablet Take 0.5 tablets (0.3 mg total) by mouth daily. 30 tablet 1   EPINEPHrine  0.3 mg/0.3 mL IJ SOAJ injection Inject 0.3 mg into the muscle as needed. (Patient not taking: Reported on 10/23/2023) 1 each 1   famotidine  (PEPCID ) 20 MG tablet TAKE 1 TABLET BY MOUTH 1 TO 2 TIMES DAILY AS NEEDED 180 tablet 0   fluticasone  (FLONASE ) 50 MCG/ACT nasal spray Use 1-2 sprays in each nostril once a day as needed for nasal congestion. 16 g 5   gabapentin  (NEURONTIN )  300 MG capsule Take 1 capsule (300 mg  total) by mouth at bedtime. 90 capsule 1   hydrALAZINE  (APRESOLINE ) 25 MG tablet Take 0.5 tablets (12.5 mg total) by mouth 3 (three) times daily. 90 tablet 6   insulin  aspart (NOVOLOG  FLEXPEN) 100 UNIT/ML FlexPen Inject 10-20 Units into the skin 3 (three) times daily before meals. 60 mL 3   insulin  glargine (LANTUS  SOLOSTAR) 100 UNIT/ML Solostar Pen Inject 45 Units into the skin daily. (Patient taking differently: Inject 30 Units into the skin at bedtime.) 45 mL 3   Insulin  Pen Needle (TECHLITE PEN NEEDLES) 32G X 4 MM MISC Use to inject insulin  into the skin 3 times per day. 100 each 0   methocarbamol  (ROBAXIN ) 500 MG tablet Take 1 tablet (500 mg total) by mouth daily. (Patient taking differently: Take 500 mg by mouth at bedtime.) 90 tablet 1   metolazone  (ZAROXOLYN ) 5 MG tablet Take 1 tablet (5 mg total) by mouth once a week.     metoprolol  succinate (TOPROL -XL) 25 MG 24 hr tablet Take 0.5 tablets (12.5 mg total) by mouth daily. 30 tablet 3   nystatin  (MYCOSTATIN /NYSTOP ) powder APPLY 1 APPLICATION TOPICALLY 3 (THREE) TIMES DAILY. 60 g 0   Polyethyl Glycol-Propyl Glycol (SYSTANE) 0.4-0.3 % SOLN Place 1 drop into both eyes daily as needed (dry eyes).     potassium chloride  SA (KLOR-CON  M) 20 MEQ tablet Take 1 tablet (20 mEq total) by mouth daily. 30 tablet 0   sertraline  (ZOLOFT ) 50 MG tablet Take 1.5 tablets (75 mg total) by mouth in the morning. 45 tablet 2   SYMBICORT  160-4.5 MCG/ACT inhaler Inhale 2 puffs into the lungs daily.     tamsulosin  (FLOMAX ) 0.4 MG CAPS capsule Take 2 capsules by mouth once daily 180 capsule 0   torsemide  (DEMADEX ) 20 MG tablet Take 20 mg daily except take 40 mg on Mon/Wed/Fri 120 tablet 3   No current facility-administered medications for this visit.     PHYSICAL EXAMINATION: ECOG PERFORMANCE STATUS: 2 - Symptomatic, <50% confined to bed  Vitals:   10/28/23 1636 10/28/23 1639  BP: (!) 146/85 107/65  Pulse: 96 94  Resp: 20  19  Temp: 98.3 F (36.8 C) 98.7 F (37.1 C)  SpO2: 96% 94%    There were no vitals filed for this visit.   GENERAL:alert, no distress and comfortable No significant swelling of the left upper extremity compared to the right upper extremity.  Both ankles are swollen and symmetric. Chest: Clear to auscultation bilaterally Heart: Rate and rhythm regular  LABORATORY DATA:  I have reviewed the data as listed Lab Results  Component Value Date   WBC 5.0 10/23/2023   HGB 11.5 (L) 10/23/2023   HCT 35.4 (L) 10/23/2023   MCV 91.0 10/23/2023   PLT 289 10/23/2023     Chemistry      Component Value Date/Time   NA 141 10/23/2023 1105   NA 142 02/13/2023 1658   K 3.5 10/23/2023 1105   CL 106 10/23/2023 1105   CO2 22 10/23/2023 1105   BUN 15 10/23/2023 1105   BUN 26 (H) 02/13/2023 1658   CREATININE 2.27 (H) 10/23/2023 1105   CREATININE 1.68 (H) 04/24/2016 1502      Component Value Date/Time   CALCIUM  9.3 10/23/2023 1105   ALKPHOS 51 10/23/2023 1105   AST 21 10/23/2023 1105   ALT 22 10/23/2023 1105   BILITOT 0.6 10/23/2023 1105   BILITOT 0.4 08/19/2022 1527       RADIOGRAPHIC STUDIES: I have personally reviewed the radiological images  as listed and agreed with the findings in the report. DG Foot Complete Right Result Date: 10/10/2023 CLINICAL DATA:  Bilateral foot pain, weakness.  History of gout EXAM: RIGHT FOOT COMPLETE - 3+ VIEW COMPARISON:  None Available. FINDINGS: Degenerative changes of the 1st MTP joint with joint space narrowing and spurring. Subchondral cyst formation. Remainder the joint spaces are maintained. No fracture, subluxation or dislocation. Soft tissues are intact. 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 No acute bony abnormality. Electronically Signed   By: Janeece Mechanic M.D.   On: 10/10/2023 18:36   DG Foot Complete Left Result Date: 10/10/2023 CLINICAL DATA:  Bilateral foot pain, weakness.  History of gout. EXAM: LEFT FOOT - COMPLETE 3+ VIEW  COMPARISON:  02/04/2021 FINDINGS: There is no evidence of fracture or dislocation. There is no evidence of arthropathy or other focal bone abnormality. Soft tissues are unremarkable. IMPRESSION: Negative. Electronically Signed   By: Janeece Mechanic M.D.   On: 10/10/2023 18:35   DG CHEST PORT 1 VIEW Result Date: 10/10/2023 CLINICAL DATA:  Infiltrate noted on imaging study. EXAM: PORTABLE CHEST 1 VIEW COMPARISON:  10/07/2023 FINDINGS: There is a left chest wall ICD with leads in the right atrial appendage and right ventricle. Stable cardiac enlargement. The lungs are hypoinflated. No pleural effusion, interstitial edema or airspace consolidation. Visualized osseous structures appear grossly intact., IMPRESSION: 1. Cardiac enlargement. 2. No acute findings. Electronically Signed   By: Kimberley Penman M.D.   On: 10/10/2023 12:15   US  RENAL Result Date: 10/08/2023 CLINICAL DATA:  Acute kidney injury EXAM: RENAL / URINARY TRACT ULTRASOUND COMPLETE COMPARISON:  11/19/2018 FINDINGS: Right Kidney: Renal measurements: 1.8 x 6.2 x 5.8 cm = volume: 183 mL. Echogenicity within normal limits. No mass or hydronephrosis visualized. Left Kidney: Renal measurements: 11.0 x 5.1 x 5.3 cm = volume: 157 mL. Echogenicity within normal limits. No mass or hydronephrosis visualized. Bladder: Appears normal for degree of bladder distention. Other: None. IMPRESSION: 1. Normal renal sonogram. Electronically Signed   By: Worthy Heads M.D.   On: 10/08/2023 19:16   DG Chest Portable 1 View Result Date: 10/07/2023 CLINICAL DATA:  Weakness EXAM: PORTABLE CHEST 1 VIEW COMPARISON:  09/07/2023 FINDINGS: Cardiac pacemaker. Shallow inspiration. Cardiac enlargement. No vascular congestion. Atelectasis or infiltration demonstrated in the right lung base. No pleural effusion or pneumothorax. IMPRESSION: Cardiac enlargement. Shallow inspiration with atelectasis or infiltration in the right base. Electronically Signed   By: Boyce Byes M.D.   On:  10/07/2023 22:25    All questions were answered. The patient knows to call the clinic with any problems, questions or concerns. I spent 30 minutes in the care of this patient including H and P, review of records, counseling and coordination of care.     Murleen Arms, MD 10/29/2023 8:49 AM

## 2023-10-28 NOTE — Progress Notes (Signed)
 Attempted return call to patient

## 2023-10-29 ENCOUNTER — Ambulatory Visit (HOSPITAL_COMMUNITY): Admission: RE | Admit: 2023-10-29 | Source: Ambulatory Visit

## 2023-11-03 ENCOUNTER — Encounter (INDEPENDENT_AMBULATORY_CARE_PROVIDER_SITE_OTHER)

## 2023-11-03 ENCOUNTER — Other Ambulatory Visit (HOSPITAL_COMMUNITY)

## 2023-11-03 DIAGNOSIS — I5022 Chronic systolic (congestive) heart failure: Secondary | ICD-10-CM

## 2023-11-03 DIAGNOSIS — Z95810 Presence of automatic (implantable) cardiac defibrillator: Secondary | ICD-10-CM

## 2023-11-03 NOTE — Progress Notes (Unsigned)
 EPIC Encounter for ICM Monitoring  Patient Name: [name] is a 58 y.o. male Date: 11/03/2023 Primary Care Physican: Lawrance Presume, MD Primary Cardiologist: Crenshaw/[name] Electrophysiologist: Marven Slimmer 08/24/2023 Office Weight: 295 lbs    09/28/2023 Weight: 291 lbs       10/06/2023 Weight: unknown lbs   10/11/2023 Weight: 279.5 lbs hospital weight 10/12/2023 Weight: 279.9 lbs hospital weight 10/13/2023  Weight: 280.2 lbs discharge weight 10/20/2023 Weight:  Not weighing at home    10/23/2023 Weight: Pt reports 5/27 he thinks he was 285 lbs at home 11/03/2023 Weight: 294 lbs   Spoke with patient and heart failure questions reviewed.  Transmission results reviewed.  Pt reports swelling of Left arm/fingers (same side as blood clot), 1 knee and both feet very swollen.  He reports 10 lb weight gain since 5/16 HF clinic OV.       Corvue thoracic impedance suggesting possible fluid accumulation starting 5/8 (following 5/6 hospital discharge) but trending back toward baseline.   Prescribed dosage:  Torsemide  20 mg take 1 tablet(s) (20 mg total) by mouth daily except take 2 tablets (40 mg total) on MWF.  Potassium 20 mEq take 1 tablet(s) (20 mEq total) by mouth every day.  Metolazone  5 mg take 1 tablet by mouth once a week.  Pt takes on Mondays (Nephrologist said he could take it up to twice a week).     Labs: 11/03/2023 BMET scheduled at HF clinic - Missed getting labs drawn and advised to reschedule 10/23/2023 Creatinine 2.27, BUN 15, Potassium 3.5, Sodium 141, GFR 32 10/13/2023 Creatinine 2.36, BUN 64, Potassium 4.1, Sodium 138, GFR 31  10/12/2023 Creatinine 2.62, BUN 78, Potassium 3.5, Sodium 135, GFR 27  10/11/2023 Creatinine 2.78, BUN 86, Potassium 3.1, Sodium 135, GFR 26  10/10/2023 Creatinine 3.05, BUN 88, Potassium 3.4, Sodium 137, GFR 23 10/09/2023 Creatinine 4.58, BUN 83, Potassium 2.8, Sodium 131, GFR 14  10/07/2023 Creatinine 4.56, BUN 77, Potassium 3.3, Sodium 133, GFR 14   09/07/2023 Creatinine 2.71, BUN 23, Potassium 4.4, Sodium 139, GFR 26 07/30/2023 Creatinine 2.74, BUN 36, Potassium 4.6, Sodium 139, GFR 26 05/21/2023 Creatinine 2.56, BUN 27, Potassium 4.1, Sodium 138, GFR 28  05/13/2023 Creatinine 2.75, BUN 23, Potassium 3.9, Sodium 138, GFR 26  A complete set of results can be found in Results Review.   Recommendations:  He has taking Torsemide  20 mg twice a day and confirmed he took Metolazone  yesterday, 5/26.   He forgot to have labs drawn and advised to call lab and reschedule for tomorrow.    Copy to [name], NP for review and recommendations if needed.   Follow-up plan: ICM clinic phone appointment on 11/10/2023 to recheck fluid levels.   91 day device clinic remote transmission 11/18/2023.     EP/Cardiology Office Visits:    Recall 11/02/2023 with Dr Marven Slimmer or [name], PA (6 month).  Recall 12/22/2023 with [name].   Copy of ICM check sent to Dr Marven Slimmer.   3 month ICM trend: 11/03/2023.    12-14 Month ICM trend:     [name], RN 11/03/2023 3:52 PM

## 2023-11-03 NOTE — Progress Notes (Unsigned)
 Message Received: Today Milford, Arlice Bene, FNP  Pessy Delamar, Myrtie Atkinson, RN Increase torsemide  to 40 mg daily, increase KCL to 40 daily. Please take metolazone  2.5 mg/extra 40 KCL today and tomorrow.  He will need a  BMET in 1 week please.

## 2023-11-03 NOTE — Progress Notes (Unsigned)
 Clarifying Metolazone  dosage with Jessica.     Jessica,    Pt has Metolazone  5 mg tablets prescribed (not prescribed 2.5 mg).   Your recommendations says to take Metolazone  2.5 mg with extra 40 Potassium meq today and tomorrow.   What dosage do you want him to take?     Thanks!

## 2023-11-04 MED ORDER — TORSEMIDE 20 MG PO TABS: 40.0000 mg | ORAL_TABLET | Freq: Every day | ORAL | 3 refills | Status: DC

## 2023-11-04 MED ORDER — POTASSIUM CHLORIDE CRYS ER 20 MEQ PO TBCR: 40.0000 meq | EXTENDED_RELEASE_TABLET | Freq: Every day | ORAL | 2 refills | Status: DC

## 2023-11-04 NOTE — Progress Notes (Unsigned)
  Received: Today Milford, Arlice Bene, FNP  Izyan Ezzell, Myrtie Atkinson, RN Sorry for confusion, metolazone  5 mg x 2 doses this week. Thanks!

## 2023-11-04 NOTE — Progress Notes (Unsigned)
 Spoke with patient and advised of Hezekiah Louis NP at HF clinic recommendations:  Increase Torsemide  to 40 mg daily Increase Potassium to 40 mEq daily  Take Metolazone  5 mg 1 tablet with additional 40 mEq of Potassium today, 5/28 and tomorrow, 5/29. BMET in a week  Pt verbalized understanding of changes and does not need any med refills at this time.  Update Epic prescriptions.   BMET order placed.  Advised to call HF clinic today to reschedule missed lab appt 5/27.   Will schedule another BMET in a week.

## 2023-11-05 NOTE — Progress Notes (Signed)
 Spoke with patient and scheduled lab for 6/5 @ 3:30 in HF clinic as recommended by Camilo Cella.  He did not call to reschedule the lab he missed on 5/27.

## 2023-11-06 ENCOUNTER — Ambulatory Visit: Payer: Medicaid Other | Admitting: Internal Medicine

## 2023-11-06 ENCOUNTER — Ambulatory Visit: Attending: Internal Medicine | Admitting: Internal Medicine

## 2023-11-06 ENCOUNTER — Inpatient Hospital Stay: Admitting: Nurse Practitioner

## 2023-11-06 VITALS — BP 127/78 | HR 77 | Ht 73.0 in | Wt 292.8 lb

## 2023-11-06 DIAGNOSIS — R040 Epistaxis: Secondary | ICD-10-CM

## 2023-11-06 DIAGNOSIS — G8929 Other chronic pain: Secondary | ICD-10-CM | POA: Diagnosis not present

## 2023-11-06 DIAGNOSIS — M545 Low back pain, unspecified: Secondary | ICD-10-CM | POA: Diagnosis not present

## 2023-11-06 DIAGNOSIS — I82722 Chronic embolism and thrombosis of deep veins of left upper extremity: Secondary | ICD-10-CM | POA: Diagnosis not present

## 2023-11-06 DIAGNOSIS — M25462 Effusion, left knee: Secondary | ICD-10-CM

## 2023-11-06 DIAGNOSIS — J3489 Other specified disorders of nose and nasal sinuses: Secondary | ICD-10-CM | POA: Diagnosis not present

## 2023-11-06 DIAGNOSIS — I5022 Chronic systolic (congestive) heart failure: Secondary | ICD-10-CM | POA: Diagnosis not present

## 2023-11-06 NOTE — Patient Instructions (Signed)
 You can go and have the x-ray done of the left knee at [facility] anytime during the week but not on the weekend.  Let me know if you change your mind about referral to pain management.  I have referred you to the ear nose and throat specialist.  Further management about whether to stop or extend duration of Eliquis  will be based on the results of the ultrasound of the arm.

## 2023-11-06 NOTE — Progress Notes (Signed)
 Patient ID: [name], male    DOB: [birth year]  MRN: [id number]  CC: Hospitalization Follow-up (Hospitalization f/u. )   Subjective: [name] is a 58 y.o. male who presents for chronic ds management and hosp f/u. 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    Patient was hospitalized earlier this month with fatigue.  He was found to be hypotensive with AKI on CKD stage IV with acute gout flare.  Baseline creatinine was around 3.7 but on presentation was 4.5.  Renal ultrasound was negative for obstruction.  Renal function improved with holding Entresto .  He was placed on low-dose colchicine  for the gout.  He was on CPAP during hospitalization and according to the discharge summary note, device was to be delivered to his house upon discharge.  Today: Saw cardiology nurse practitioner on the 16th of this month.  Plan is to continue to hold the Entresto .  Started on hydralazine  12.5 mg 3 times a day instead.  He was continued with Toprol  12.5 mg daily and Amiodarone .  Torsemide  was decreased to 40 mg daily.  Today he reports having some swelling in his feet.  He has chronic dyspnea with minimal exertion.  DVT left arm: Thinks there has been some increase swelling and that clot has reoccurred. Still on Eliquis .  Saw Dr. Arno Bibles recently and repeat doppler US  ordered and scheduled for next mth.  Planes of chronic lower back pain for which he has had injections by Dr. Daisey Dryer. CT of the lumbar spine done back in December revealed some bulging disc at L2-L3 with right subarticular disc protrusion resulting in canal stenosis right greater than left and moderate multilevel degenerative disease and lumbar facet arthropathy. -States  he was told the next option would be doing an epidural injection or referred to pain management.  He is not excited about either option.  Still has swelling in the left knee but no pain.  X-ray was ordered on last visit with me in January.  He had other stuff going on and has not made it as he had to have this x-ray done.  Complains of having home from both nostrils x 2 months.  He also noticed blood mixed in with the mucus from the right nostril when he blows his nose.  Felt a palpable sore in the right nostril when it started 2 months ago.  Denies any feeling of pressure in the paranasal sinuses. Patient Active Problem List   Diagnosis Date Noted   ARF (acute renal failure) (HCC) 10/08/2023   Acute gout 10/08/2023   Anemia 10/08/2023   Acute renal failure (ARF) (HCC) 10/08/2023   Hypotension 09/07/2023   Acute deep vein thrombosis (DVT) of left upper extremity (HCC) 04/24/2023   Cellulitis of perineum 04/02/2023   Heart failure (HCC) 02/28/2023   Class 3 obesity 02/28/2023   SOB (shortness of breath) 02/27/2023   Loud snoring 10/28/2022   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   Hyperlipidemia associated with type 2 diabetes mellitus (HCC) 09/18/2022   PTSD (post-traumatic stress disorder) 07/10/2022   Panic attack 06/17/2022   Passive suicidal ideations 06/17/2022   History of ventricular tachycardia 06/14/2022   Essential hypertension 06/14/2022   Stage 3b chronic kidney disease (HCC) 05/15/2022  Acute gout due to renal impairment involving left ankle 05/07/2022   Acute gout due to renal impairment involving right knee 05/07/2022   Hyponatremia 05/07/2022   Diarrhea 05/06/2022   Hyperglycemia 05/06/2022   Cellulitis of buttock 05/06/2022   Acute renal failure superimposed on stage 3b chronic kidney disease (HCC) 05/06/2022   Hypokalemia 05/05/2022   Rectal abscess 05/05/2022   Left buttock abscess 05/05/2022   Cardiac arrest (HCC) 05/03/2022   AKI (acute kidney  injury) (HCC) 05/03/2022   Tachycardia 05/03/2022   CKD (chronic kidney disease) stage 2, GFR 60-89 ml/min 10/24/2019   Acute non-recurrent frontal sinusitis 06/27/2019   COVID-19 virus infection 06/27/2019   Glaucoma suspect 12/22/2018   Seasonal and perennial allergic rhinoconjunctivitis 10/25/2018   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   Urticaria 05/03/2018   S/P inguinal hernia repair 03/01/2018   Unilateral inguinal hernia without obstruction or gangrene 07/24/2017   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   Hypersomnia 02/28/2016   Abnormal PFT 02/24/2016   Dyspnea 01/28/2016   Diabetic polyneuropathy associated with type 2 diabetes mellitus (HCC) 12/18/2015   Lumbar back pain 12/18/2015   Type 2 diabetes mellitus with hyperlipidemia (HCC) 09/17/2015   Left median nerve neuropathy 04/16/2015   Lesion of right median nerve at forearm 04/16/2015   ICD (implantable cardioverter-defibrillator) in place 03/28/2014   Chronic systolic congestive heart failure (HCC) 03/28/2014   Nonischemic cardiomyopathy (HCC) 11/09/2013   Syncope 10/20/2013   Chest pain 07/28/2013   RENAL CALCULUS 05/13/2009   Obesity 05/08/2009   Former heavy cigarette smoker (20-39 per day) 05/08/2009   HOARSENESS 05/08/2009   GAD (generalized anxiety disorder) 02/28/2008   ASTHMATIC BRONCHITIS, ACUTE 02/28/2008   GERD 02/28/2008   IRRITABLE BOWEL SYNDROME 02/28/2008     Current Outpatient Medications on File Prior to Visit  Medication Sig Dispense Refill   acetaminophen  (TYLENOL ) 500 MG tablet Take 1,000 mg by mouth every 8 (eight) hours as needed.     amiodarone  (PACERONE ) 200 MG tablet Take 2 tablets (400 mg total) by mouth daily. 180 tablet 3   apixaban  (ELIQUIS ) 5 MG TABS tablet Take 1 tablet (5 mg total) by mouth 2 (two) times daily. 60 tablet 3   atorvastatin  (LIPITOR) 10 MG tablet Take 1 tablet by mouth once daily (Patient taking differently: Take 10 mg by mouth at  bedtime.) 90 tablet 0   cetirizine  (EQ ALLERGY RELIEF, CETIRIZINE ,) 10 MG tablet Take 1 tablet (10 mg total) by mouth daily. 90 tablet 1   cholecalciferol  (VITAMIN D3) 25 MCG (1000 UNIT) tablet Take 1,000 Units by mouth in the morning.     clonazePAM  (KLONOPIN ) 0.5 MG tablet Take 1 tablet (0.5 mg total) by mouth 2 (two) times daily as needed for anxiety. (Patient taking differently: Take 0.5 mg by mouth 2 (two) times daily.) 60 tablet 1   Clotrimazole  1 % OINT Apply to the affected area twice a day (Patient taking differently: Apply 1 Application topically 2 (two) times daily as needed. Apply to the affected area twice a day) 56.7 g 0   colchicine  0.6 MG tablet Take 0.5 tablets (0.3 mg total) by mouth daily. 30 tablet 1   EPINEPHrine  0.3 mg/0.3 mL IJ SOAJ injection Inject 0.3 mg into the muscle as needed. 1 each 1   famotidine  (PEPCID ) 20 MG tablet TAKE 1 TABLET BY MOUTH 1 TO 2 TIMES DAILY AS NEEDED 180 tablet 0   fluticasone  (FLONASE ) 50 MCG/ACT nasal spray Use 1-2 sprays in each nostril once  a day as needed for nasal congestion. 16 g 5   gabapentin  (NEURONTIN ) 300 MG capsule Take 1 capsule (300 mg total) by mouth at bedtime. 90 capsule 1   hydrALAZINE  (APRESOLINE ) 25 MG tablet Take 0.5 tablets (12.5 mg total) by mouth 3 (three) times daily. 90 tablet 6   insulin  aspart (NOVOLOG  FLEXPEN) 100 UNIT/ML FlexPen Inject 10-20 Units into the skin 3 (three) times daily before meals. 60 mL 3   insulin  glargine (LANTUS  SOLOSTAR) 100 UNIT/ML Solostar Pen Inject 45 Units into the skin daily. (Patient taking differently: Inject 30 Units into the skin at bedtime.) 45 mL 3   Insulin  Pen Needle (TECHLITE PEN NEEDLES) 32G X 4 MM MISC Use to inject insulin  into the skin 3 times per day. 100 each 0   methocarbamol  (ROBAXIN ) 500 MG tablet Take 1 tablet (500 mg total) by mouth daily. (Patient taking differently: Take 500 mg by mouth at bedtime.) 90 tablet 1   metolazone  (ZAROXOLYN ) 5 MG tablet Take 1 tablet (5 mg total)  by mouth once a week.     metoprolol  succinate (TOPROL -XL) 25 MG 24 hr tablet Take 0.5 tablets (12.5 mg total) by mouth daily. 30 tablet 3   nystatin  (MYCOSTATIN /NYSTOP ) powder APPLY 1 APPLICATION TOPICALLY 3 (THREE) TIMES DAILY. 60 g 0   Polyethyl Glycol-Propyl Glycol (SYSTANE) 0.4-0.3 % SOLN Place 1 drop into both eyes daily as needed (dry eyes).     potassium chloride  SA (KLOR-CON  M) 20 MEQ tablet Take 2 tablets (40 mEq total) by mouth daily. 180 tablet 2   sertraline  (ZOLOFT ) 50 MG tablet Take 1.5 tablets (75 mg total) by mouth in the morning. 45 tablet 2   SYMBICORT  160-4.5 MCG/ACT inhaler Inhale 2 puffs into the lungs daily.     tamsulosin  (FLOMAX ) 0.4 MG CAPS capsule Take 2 capsules by mouth once daily 180 capsule 0   torsemide  (DEMADEX ) 20 MG tablet Take 2 tablets (40 mg total) by mouth daily. 180 tablet 3   No current facility-administered medications on file prior to visit.    Allergies  Allergen Reactions   Bee Venom Anaphylaxis   Trulicity  [Dulaglutide ] Anaphylaxis and Diarrhea    Extra dose of Trulicity  caused cardiac arrest. Patient experience onset of diarrhea with using this medication - cleared after med stopped.    Social History   Socioeconomic History   Marital status: Single    Spouse name: Not on file   Number of children: Not on file   Years of education: Not on file   Highest education level: Not on file  Occupational History   Occupation: Disabled  Tobacco Use   Smoking status: Former    Current packs/day: 0.00    Average packs/day: 1 pack/day for 28.0 years (28.0 ttl pk-yrs)    Types: Cigarettes    Start date: 06/03/1986    Quit date: 06/03/2014    Years since quitting: 9.4   Smokeless tobacco: Never  Vaping Use   Vaping status: Never Used  Substance and Sexual Activity   Alcohol use: Not Currently   Drug use: Yes    Types: Marijuana    Comment: occasionally;   Sexual activity: Yes  Other Topics Concern   Not on file  Social History  Narrative   Pt lives with his brother    Social Drivers of Health   Financial Resource Strain: Low Risk  (05/25/2023)   Overall Financial Resource Strain (CARDIA)    Difficulty of Paying Living Expenses: Not hard at all  Recent  Concern: Financial Resource Strain - High Risk (03/24/2023)   Overall Financial Resource Strain (CARDIA)    Difficulty of Paying Living Expenses: Hard  Food Insecurity: No Food Insecurity (10/08/2023)   Hunger Vital Sign    Worried About Running Out of Food in the Last Year: Never true    Ran Out of Food in the Last Year: Never true  Transportation Needs: No Transportation Needs (10/08/2023)   PRAPARE - Administrator, Civil Service (Medical): No    Lack of Transportation (Non-Medical): No  Physical Activity: Inactive (05/25/2023)   Exercise Vital Sign    Days of Exercise per Week: 0 days    Minutes of Exercise per Session: 0 min  Stress: Stress Concern Present (05/25/2023)   [employer] of Occupational Health - Occupational Stress Questionnaire    Feeling of Stress : Rather much  Social Connections: Moderately Integrated (10/08/2023)   Social Connection and Isolation Panel [NHANES]    Frequency of Communication with Friends and Family: More than three times a week    Frequency of Social Gatherings with Friends and Family: More than three times a week    Attends Religious Services: More than 4 times per year    Active Member of Clubs or Organizations: Yes    Attends Banker Meetings: 1 to 4 times per year    Marital Status: Never married  Intimate Partner Violence: Not At Risk (10/08/2023)   Humiliation, Afraid, Rape, and Kick questionnaire    Fear of Current or Ex-Partner: No    Emotionally Abused: No    Physically Abused: No    Sexually Abused: No    Family History  Problem Relation Age of Onset   Diabetes Father    Arthritis Father    Hypertension Father    Diabetes Mother    Arthritis Mother    Hypertension Mother     Hypertension Brother     Past Surgical History:  Procedure Laterality Date   CARDIAC CATHETERIZATION  08/10/13   CHOLECYSTECTOMY  ~ 2010   IMPLANTABLE CARDIOVERTER DEFIBRILLATOR IMPLANT  12/23/2013   STJ Fortify ICD implanted by Dr Nunzio Belch for cardiomyopathy and syncope   IMPLANTABLE CARDIOVERTER DEFIBRILLATOR IMPLANT N/A 12/23/2013   Procedure: IMPLANTABLE CARDIOVERTER DEFIBRILLATOR IMPLANT;  Surgeon: Ellaree Gunther, MD;  Location: [facility] CATH LAB;  Service: Cardiovascular;  Laterality: N/A;   INGUINAL HERNIA REPAIR Left 03/01/2018   Procedure: OPEN REPAIR OF LEFT INGUINAL HERNIA WITH MESH;  Surgeon: Aldean Hummingbird, MD;  Location: [facility] ORS;  Service: General;  Laterality: Left;   LEAD REVISION/REPAIR N/A 05/15/2022   Procedure: LEAD REVISION/REPAIR;  Surgeon: Boyce Byes, MD;  Location: [facility] INVASIVE CV LAB;  Service: Cardiovascular;  Laterality: N/A;   LEFT HEART CATHETERIZATION WITH CORONARY ANGIOGRAM N/A 08/10/2013   Procedure: LEFT HEART CATHETERIZATION WITH CORONARY ANGIOGRAM;  Surgeon: Lucendia Rusk, MD;  Location: [facility] CATH LAB;  Service: Cardiovascular;  Laterality: N/A;   ORIF HUMERUS FRACTURE Left 08/12/2013   Procedure: OPEN REDUCTION INTERNAL FIXATION (ORIF) HUMERAL SHAFT FRACTURE;  Surgeon: Timothy Ford, MD;  Location: [facility] OR;  Service: Orthopedics;  Laterality: Left;  Open Reduction Internal Fixation Left Humerus   RIGHT/LEFT HEART CATH AND CORONARY ANGIOGRAPHY N/A 05/12/2022   Procedure: RIGHT/LEFT HEART CATH AND CORONARY ANGIOGRAPHY;  Surgeon: Alwin Baars, DO;  Location: [facility] INVASIVE CV LAB;  Service: Cardiovascular;  Laterality: N/A;   URETEROSCOPY     "laser for kidney stones"    ROS: Review of Systems Negative except as stated  above  PHYSICAL EXAM: BP 127/78   Pulse 77   Ht 6\' 1"  (1.854 m)   Wt 292 lb 12.8 oz (132.8 kg)   SpO2 96%   BMI 38.63 kg/m   Physical Exam   General appearance - alert, well appearing, and in no distress.  However he is noted to be dyspneic  when bending over to take off and put his shoes back on. Mental status - flat affect Nose - moderate enlargement of nasal turbinates.  ? Nasal polyp on the RT. No dried blood noted Chest -breath sounds are decreased but no crackles or wheezes Heart -regular rate and rhythm. Extremities -trace lower extremity and pedal edema.     Latest Ref Rng & Units 10/23/2023   11:05 AM 10/13/2023    8:59 AM 10/12/2023    2:48 AM  CMP  Glucose 70 - 99 mg/dL 161  096  045   BUN 6 - 20 mg/dL 15  64  78   Creatinine 0.61 - 1.24 mg/dL 4.09  8.11  9.14   Sodium 135 - 145 mmol/L 141  138  135   Potassium 3.5 - 5.1 mmol/L 3.5  4.1  3.5   Chloride 98 - 111 mmol/L 106  103  97   CO2 22 - 32 mmol/L 22  22  24    Calcium  8.9 - 10.3 mg/dL 9.3  9.6  78.2   Total Protein 6.5 - 8.1 g/dL 7.1     Total Bilirubin 0.0 - 1.2 mg/dL 0.6     Alkaline Phos 38 - 126 U/L 51     AST 15 - 41 U/L 21     ALT 0 - 44 U/L 22      Lipid Panel     Component Value Date/Time   CHOL 166 02/13/2023 1658   TRIG 283 (H) 02/13/2023 1658   HDL 44 02/13/2023 1658   CHOLHDL 3.8 02/13/2023 1658   CHOLHDL 4 05/08/2009 1040   VLDL 27.0 05/08/2009 1040   LDLCALC 76 02/13/2023 1658    CBC    Component Value Date/Time   WBC 5.0 10/23/2023 1105   RBC 3.89 (L) 10/23/2023 1105   HGB 11.5 (L) 10/23/2023 1105   HGB 14.0 08/26/2021 1608   HCT 35.4 (L) 10/23/2023 1105   HCT 40.9 08/26/2021 1608   PLT 289 10/23/2023 1105   PLT 286 08/26/2021 1608   MCV 91.0 10/23/2023 1105   MCV 88 08/26/2021 1608   MCH 29.6 10/23/2023 1105   MCHC 32.5 10/23/2023 1105   RDW 15.2 10/23/2023 1105   RDW 13.0 08/26/2021 1608   LYMPHSABS 2.2 10/08/2023 1152   LYMPHSABS 1.7 06/28/2018 1440   MONOABS 1.2 (H) 10/08/2023 1152   EOSABS 0.1 10/08/2023 1152   EOSABS 0.3 06/28/2018 1440   BASOSABS 0.1 10/08/2023 1152   BASOSABS 0.0 06/28/2018 1440    ASSESSMENT AND PLAN: 1. Right-sided epistaxis (Primary) - Ambulatory referral to ENT  2. Nasal  discomfort - Ambulatory referral to ENT  3. Chronic midline low back pain without sciatica Plugged in with PMR.  He is not sure that he is wanting to have epidural injection done.  Advised that if he does he would need to be off the Eliquis  for at least 3 to 4 days.  Advised to consider pain management.  If he changes in mind on wanting a referral, he will let me know.  4. Swelling of joint, knee, left He will go to radiology with [facility] to have the  x-ray done as was previously ordered  5. 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  Further management will be based on results.  He will continue Eliquis  for now.  6. Chronic systolic congestive heart failure (HCC) Followed by cardiology.  Continue torsemide  40 mg daily.  Advised that he can take an extra dose whenever he noticed increased swelling in the extremity.  Continue potassium supplement, metoprolol  12.5 mg daily and hydralazine  25 mg half a tablet 3 times a day.   Patient was given the opportunity to ask questions.  Patient verbalized understanding of the plan and was able to repeat key elements of the plan.   This documentation was completed using Paediatric nurse.  Any transcriptional errors are unintentional.  Orders Placed This Encounter  Procedures   Ambulatory referral to ENT     Requested Prescriptions    No prescriptions requested or ordered in this encounter    Return in about 3 months (around 02/06/2024).  Concetta Dee, MD, FACP

## 2023-11-07 ENCOUNTER — Encounter: Payer: Self-pay | Admitting: Internal Medicine

## 2023-11-10 ENCOUNTER — Telehealth: Payer: Self-pay

## 2023-11-10 ENCOUNTER — Encounter: Attending: Cardiology

## 2023-11-10 DIAGNOSIS — Z95810 Presence of automatic (implantable) cardiac defibrillator: Secondary | ICD-10-CM

## 2023-11-10 DIAGNOSIS — I5022 Chronic systolic (congestive) heart failure: Secondary | ICD-10-CM

## 2023-11-10 NOTE — Progress Notes (Signed)
 EPIC Encounter for ICM Monitoring  Patient Name: [name] is a 58 y.o. male Date: 11/10/2023 Primary Care Physican: Lawrance Presume, MD Primary Cardiologist: Crenshaw/[name] Electrophysiologist: Marven Slimmer 08/24/2023 Office Weight: 295 lbs    09/28/2023 Weight: 291 lbs       10/06/2023 Weight: unknown lbs   10/11/2023 Weight: 279.5 lbs hospital weight 10/12/2023 Weight: 279.9 lbs hospital weight 10/13/2023  Weight: 280.2 lbs discharge weight 10/20/2023 Weight:  Not weighing at home    10/23/2023 Weight: Pt reports 5/27 he thinks he was 285 lbs at home 11/03/2023 Weight: 294 lbs (10 lb weight gain since 5/16)   Attempted call to patient and unable to reach.  Transmission results reviewed.      Corvue thoracic impedance suggesting possible dryness starting 5/31 most likely in response to taking Metolazone  x 2 day as recommended by HF clinic on 5/27.   Prescribed dosage:  Torsemide  20 mg take 2 tablet(s) (40 mg total) by mouth daily.  Potassium 20 mEq take 2 tablet(s) (40 mEq total) by mouth daily.  Metolazone  5 mg take 1 tablet by mouth once a week.  Pt takes on Mondays (Nephrologist said he could take it up to twice a week).     Labs: 11/12/2023 BMET scheduled at HF clinic  10/23/2023 Creatinine 2.27, BUN 15, Potassium 3.5, Sodium 141, GFR 32 10/13/2023 Creatinine 2.36, BUN 64, Potassium 4.1, Sodium 138, GFR 31  10/12/2023 Creatinine 2.62, BUN 78, Potassium 3.5, Sodium 135, GFR 27  10/11/2023 Creatinine 2.78, BUN 86, Potassium 3.1, Sodium 135, GFR 26  10/10/2023 Creatinine 3.05, BUN 88, Potassium 3.4, Sodium 137, GFR 23 10/09/2023 Creatinine 4.58, BUN 83, Potassium 2.8, Sodium 131, GFR 14  10/07/2023 Creatinine 4.56, BUN 77, Potassium 3.3, Sodium 133, GFR 14  09/07/2023 Creatinine 2.71, BUN 23, Potassium 4.4, Sodium 139, GFR 26 07/30/2023 Creatinine 2.74, BUN 36, Potassium 4.6, Sodium 139, GFR 26 05/21/2023 Creatinine 2.56, BUN 27, Potassium 4.1, Sodium 138, GFR 28  05/13/2023  Creatinine 2.75, BUN 23, Potassium 3.9, Sodium 138, GFR 26  A complete set of results can be found in Results Review.   Recommendations:  Unable to reach.       Follow-up plan: ICM clinic phone appointment on 12/28/2023.   91 day device clinic remote transmission 11/18/2023.     EP/Cardiology Office Visits:    Recall 11/02/2023 with Dr Marven Slimmer or [name], PA (6 month).  Recall 12/22/2023 with [name].   Copy of ICM check sent to Dr Marven Slimmer.   3 month ICM trend: 11/09/2023.    12-14 Month ICM trend:     [name], RN 11/10/2023 8:04 AM

## 2023-11-10 NOTE — Telephone Encounter (Signed)
 Remote ICM transmission received.  Attempted call to patient regarding ICM remote transmission and no answer.

## 2023-11-12 ENCOUNTER — Ambulatory Visit (HOSPITAL_COMMUNITY)
Admission: RE | Admit: 2023-11-12 | Discharge: 2023-11-12 | Disposition: A | Discharge status: Home / Self Care | Source: Ambulatory Visit | Attending: Cardiology | Admitting: Cardiology

## 2023-11-12 DIAGNOSIS — I5022 Chronic systolic (congestive) heart failure: Secondary | ICD-10-CM | POA: Diagnosis present

## 2023-11-12 LAB — BASIC METABOLIC PANEL WITH GFR
Anion gap: 12 (ref 5–15)
BUN: 23 mg/dL — ABNORMAL HIGH (ref 6–20)
CO2: 27 mmol/L (ref 22–32)
Calcium: 9.3 mg/dL (ref 8.9–10.3)
Chloride: 101 mmol/L (ref 98–111)
Creatinine, Ser: 2.76 mg/dL — ABNORMAL HIGH (ref 0.61–1.24)
GFR, Estimated: 26 mL/min — ABNORMAL LOW (ref >60)
Glucose, Bld: 181 mg/dL — ABNORMAL HIGH (ref 70–99)
Potassium: 3.5 mmol/L (ref 3.5–5.1)
Sodium: 140 mmol/L (ref 135–145)

## 2023-11-13 ENCOUNTER — Ambulatory Visit (HOSPITAL_COMMUNITY): Payer: Self-pay | Admitting: Family Medicine

## 2023-11-16 ENCOUNTER — Ambulatory Visit (INDEPENDENT_AMBULATORY_CARE_PROVIDER_SITE_OTHER): Admitting: Physician Assistant

## 2023-11-16 ENCOUNTER — Encounter (INDEPENDENT_AMBULATORY_CARE_PROVIDER_SITE_OTHER): Payer: Self-pay | Admitting: Physician Assistant

## 2023-11-16 VITALS — BP 128/83 | HR 100 | Ht 72.0 in | Wt 282.0 lb

## 2023-11-16 DIAGNOSIS — R040 Epistaxis: Secondary | ICD-10-CM | POA: Diagnosis not present

## 2023-11-16 DIAGNOSIS — J301 Allergic rhinitis due to pollen: Secondary | ICD-10-CM

## 2023-11-16 DIAGNOSIS — J343 Hypertrophy of nasal turbinates: Secondary | ICD-10-CM

## 2023-11-16 MED ORDER — LORATADINE 10 MG PO TABS: 10.0000 mg | ORAL_TABLET | Freq: Every day | ORAL | 11 refills | Status: DC

## 2023-11-16 MED ORDER — AMOXICILLIN-POT CLAVULANATE 875-125 MG PO TABS
1.0000 | ORAL_TABLET | Freq: Two times a day (BID) | ORAL | 0 refills | Status: AC
Start: 2023-11-16 — End: 2023-11-26

## 2023-11-16 MED ORDER — FLUTICASONE PROPIONATE 50 MCG/ACT NA SUSP: 2.0000 | Freq: Every day | NASAL | 6 refills | Status: AC
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
## 2023-11-16 NOTE — Progress Notes (Unsigned)
 Dear Dr. Lincoln Renshaw, Here is my assessment for our mutual patient, [name]. Thank you for allowing me the opportunity to care for your patient. Please do not hesitate to contact me should you have any other questions. Sincerely, Belma Boxer PA-C  Otolaryngology Clinic Note Referring provider: Dr. Lincoln Renshaw HPI:  [name] is a 58 y.o. male kindly referred by Dr. 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  The patient notes approximately 1 month ago he had what felt like a knot in his right nose.  He notes since that time he has had bilateral nasal congestion and intermittent purulence from both nostrils with some blood mixed in.  He notes a foul odor.  He notes discomfort on both sides of his nose.  He denies any frank blood.  He denies any significant trauma to his nose, he notes his history of seasonal allergies and takes Zyrtec  which he reports has controlled some of the seasonal allergies.  He denies any changes to smell or taste.  He does have difficulty breathing through both nostrils.  Of note he was hospitalized in the month of May secondary to AKI on CKD stage IV.  At that time he was on CPAP during the hospitalization.     Independent Review of Additional Tests or Records:  Office visit note on 11/06/2023   PMH/Meds/All/SocHx/FamHx/ROS:   Past Medical History:  Diagnosis Date   AICD (automatic cardioverter/defibrillator) present    Chronic bronchitis (HCC)    get it most q yr (12/23/2013)   Chronic systolic (congestive) heart failure (HCC)    Fracture of left humerus    a. 07/2013.   GERD (gastroesophageal reflux disease)    not  at present time   Heart murmur    born w/it    History of renal calculi    Hypertension    Kidney stones    NICM (nonischemic cardiomyopathy) (HCC)    a. 07/2013 Echo: EF 20-25%, diff HK, Gr2 DD, mild MR, mod dil LA/RA. 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    Shockable heart rhythm detected by automated external defibrillator    Syncope    Type II diabetes mellitus (HCC) 2006     Past Surgical History:  Procedure Laterality Date   CARDIAC CATHETERIZATION  08/10/13   CHOLECYSTECTOMY  ~ 2010   IMPLANTABLE CARDIOVERTER DEFIBRILLATOR IMPLANT  12/23/2013   STJ Fortify ICD implanted by Dr Nunzio Belch for cardiomyopathy and syncope   IMPLANTABLE CARDIOVERTER DEFIBRILLATOR IMPLANT N/A 12/23/2013   Procedure: IMPLANTABLE CARDIOVERTER DEFIBRILLATOR IMPLANT;  Surgeon: Ellaree Gunther, MD;  Location: [facility] C[facility] LAB;  Service: Cardiovascular;  Laterality: N/A;   INGUINAL HERNIA REPAIR Left 03/01/2018   Procedure: OPEN REPAIR OF LEFT INGUINAL HERNIA WITH MESH;  Surgeon: Aldean Hummingbird, MD;  Location: [facility] ORS;  Service: General;  Laterality: Left;   LEAD REVISION/REPAIR N/A 05/15/2022   Procedure: LEAD REVISION/REPAIR;  Surgeon: Boyce Byes, MD;  Location: [facility] INVASIVE CV LAB;  Service: Cardiovascular;  Laterality: N/A;   LEFT HEART CATHETERIZATION WITH CORONARY ANGIOGRAM N/A 08/10/2013   Procedure: LEFT HEART CATHETERIZATION WITH  CORONARY ANGIOGRAM;  Surgeon: Lucendia Rusk, MD;  Location: [facility] CATH LAB;  Service: Cardiovascular;  Laterality: N/A;   ORIF HUMERUS FRACTURE Left 08/12/2013   Procedure: OPEN REDUCTION INTERNAL FIXATION (ORIF) HUMERAL SHAFT FRACTURE;  Surgeon: Timothy Ford, MD;  Location: [facility] OR;  Service: Orthopedics;  Laterality: Left;  Open Reduction Internal Fixation Left Humerus   RIGHT/LEFT HEART CATH AND CORONARY ANGIOGRAPHY N/A 05/12/2022   Procedure: RIGHT/LEFT HEART CATH AND CORONARY ANGIOGRAPHY;  Surgeon:  Alwin Baars, DO;  Location: [facility] INVASIVE CV LAB;  Service: Cardiovascular;  Laterality: N/A;   URETEROSCOPY     laser for kidney stones    Family History  Problem Relation Age of Onset   Diabetes Father    Arthritis Father    Hypertension Father    Diabetes Mother    Arthritis Mother    Hypertension Mother    Hypertension Brother      Social Connections: Moderately Integrated (10/08/2023)   Social Connection and Isolation Panel [NHANES]    Frequency of Communication with Friends and Family: More than three times a week    Frequency of Social Gatherings with Friends and Family: More than three times a week    Attends Religious Services: More than 4 times per year    Active Member of [employer] or Organizations: Yes    Attends Banker Meetings: 1 to 4 times per year    Marital Status: Never married      Current Outpatient Medications:    acetaminophen  (TYLENOL ) 500 MG tablet, Take 1,000 mg by mouth every 8 (eight) hours as needed., Disp: , Rfl:    amiodarone  (PACERONE ) 200 MG tablet, Take 2 tablets (400 mg total) by mouth daily., Disp: 180 tablet, Rfl: 3   apixaban  (ELIQUIS ) 5 MG TABS tablet, Take 1 tablet (5 mg total) by mouth 2 (two) times daily., Disp: 60 tablet, Rfl: 3   atorvastatin  (LIPITOR) 10 MG tablet, Take 1 tablet by mouth once daily (Patient taking differently: Take 10 mg by mouth at bedtime.), Disp: 90 tablet, Rfl: 0   cetirizine  (EQ ALLERGY RELIEF, CETIRIZINE ,) 10 MG tablet, Take 1 tablet (10 mg total) by mouth daily., Disp: 90 tablet, Rfl: 1   cholecalciferol  (VITAMIN D3) 25 MCG (1000 UNIT) tablet, Take 1,000 Units by mouth in the morning., Disp: , Rfl:    clonazePAM  (KLONOPIN ) 0.5 MG tablet, Take 1 tablet (0.5 mg total) by mouth 2 (two) times daily as needed for anxiety. (Patient taking differently: Take 0.5 mg by mouth 2 (two) times daily.), Disp: 60 tablet, Rfl: 1   Clotrimazole  1 % OINT, Apply to the affected area twice a day (Patient taking  differently: Apply 1 Application topically 2 (two) times daily as needed. Apply to the affected area twice a day), Disp: 56.7 g, Rfl: 0   colchicine  0.6 MG tablet, Take 0.5 tablets (0.3 mg total) by mouth daily., Disp: 30 tablet, Rfl: 1   EPINEPHrine  0.3 mg/0.3 mL IJ SOAJ injection, Inject 0.3 mg into the muscle as needed., Disp: 1 each, Rfl: 1   famotidine  (PEPCID ) 20 MG tablet, TAKE 1 TABLET BY MOUTH 1 TO 2 TIMES DAILY AS NEEDED, Disp: 180 tablet, Rfl: 0   fluticasone  (FLONASE ) 50 MCG/ACT nasal spray, Use 1-2 sprays in each nostril once a day as needed for nasal congestion., Disp: 16 g, Rfl: 5   gabapentin  (NEURONTIN ) 300 MG capsule, Take 1 capsule (300 mg total) by mouth at bedtime., Disp: 90 capsule, Rfl: 1   hydrALAZINE  (APRESOLINE ) 25  MG tablet, Take 0.5 tablets (12.5 mg total) by mouth 3 (three) times daily., Disp: 90 tablet, Rfl: 6   insulin  aspart (NOVOLOG  FLEXPEN) 100 UNIT/ML FlexPen, Inject 10-20 Units into the skin 3 (three) times daily before meals., Disp: 60 mL, Rfl: 3   insulin  glargine (LANTUS  SOLOSTAR) 100 UNIT/ML Solostar Pen, Inject 45 Units into the skin daily. (Patient taking differently: Inject 30 Units into the skin at bedtime.), Disp: 45 mL, Rfl: 3   Insulin  Pen Needle (TECHLITE PEN NEEDLES) 32G X 4 MM MISC, Use to inject insulin  into the skin 3 times per day., Disp: 100 each, Rfl: 0   methocarbamol  (ROBAXIN ) 500 MG tablet, Take 1 tablet (500 mg total) by mouth daily. (Patient taking differently: Take 500 mg by mouth at bedtime.), Disp: 90 tablet, Rfl: 1   metolazone  (ZAROXOLYN ) 5 MG tablet, Take 1 tablet (5 mg total) by mouth once a week., Disp: , Rfl:    metoprolol  succinate (TOPROL -XL) 25 MG 24 hr tablet, Take 0.5 tablets (12.5 mg total) by mouth daily., Disp: 30 tablet, Rfl: 3   nystatin  (MYCOSTATIN /NYSTOP ) powder, APPLY 1 APPLICATION TOPICALLY 3 (THREE) TIMES DAILY., Disp: 60 g, Rfl: 0   Polyethyl Glycol-Propyl Glycol (SYSTANE) 0.4-0.3 % SOLN, Place 1 drop into both eyes  daily as needed (dry eyes)., Disp: , Rfl:    potassium chloride  SA (KLOR-CON  M) 20 MEQ tablet, Take 2 tablets (40 mEq total) by mouth daily., Disp: 180 tablet, Rfl: 2   sertraline  (ZOLOFT ) 50 MG tablet, Take 1.5 tablets (75 mg total) by mouth in the morning., Disp: 45 tablet, Rfl: 2   SYMBICORT  160-4.5 MCG/ACT inhaler, Inhale 2 puffs into the lungs daily., Disp: , Rfl:    tamsulosin  (FLOMAX ) 0.4 MG CAPS capsule, Take 2 capsules by mouth once daily, Disp: 180 capsule, Rfl: 0   torsemide  (DEMADEX ) 20 MG tablet, Take 2 tablets (40 mg total) by mouth daily., Disp: 180 tablet, Rfl: 3   Physical Exam:   BP 128/83   Pulse 100   Ht 6' (1.829 m)   Wt 282 lb (127.9 kg)   SpO2 92%   BMI 38.25 kg/m   Pertinent Findings  CN II-XII intact Bilateral EAC clear and TM intact with well pneumatized middle ear spaces Anterior rhinoscopy: Septum midline; bilateral inferior turbinates with grade bilateral inferior turbinate hypertrophy, no obvious lesions, no purulence or epistaxis  No lesions of oral cavity/oropharynx; dentition wnl No obviously palpable neck masses/lymphadenopathy/thyromegaly No respiratory distress or stridor  Seprately Identifiable Procedures:  Procedure Note Pre-procedure diagnosis:  epistaxis Post-procedure diagnosis: Same Procedure: Transnasal Fiberoptic Laryngoscopy, CPT 16109 - Mod 25 Indication: see above Complications: None apparent EBL: 0 mL   The procedure was undertaken to further evaluate the patient's complaint of epistaxis, with mirror exam inadequate for appropriate examination due to gag reflex and poor patient tolerance   Procedure:  Patient was identified as correct patient. Verbal consent was obtained. The nose was sprayed with oxymetazoline and 4% lidocaine . The The flexible laryngoscope was passed through in an attempt to view the nose to view the nasal cavity, pharynx (oropharynx, hypopharynx) and larynx.  The larynx was unable to be examined due to patient  tolerance. Documentation was obtained and reviewed with patient. The scope was removed.    Findings: The nasal cavity and nasopharynx did not reveal any masses or lesions, mucosa appeared to be without obvious lesions. He had rather severe turbinate hypertrophy which made the exam limited due to ability to navigate as well as patient tolerance.  I was able to pass the scope along the floor of the nose reaching the nasopharynx. I did not appreciate any purulence or epistaxis. No obvious lesions although again the exam was limited.    Impression & Plans:  Eduin Friedel is a 58 y.o. male with the following   Epistaxis-  58 year old gentleman presenting today for epistaxis.  His main complaint is the purulent smell from his nose and nasal congestion.  He has rather severe turbinate hypertrophy.  I was able to have a limited nasal endoscopy exam which did not reveal any purulence or obvious lesions but I was only able to pass along the floor of the nose into the nasopharynx before I had to end the exam.  I do feel the patient would benefit most from a course of antibiotics given the reported purulence, this is bilateral question bacterial sinusitis.  Will use Augmentin  for this.  Given the hypertrophy I prescribed Flonase  as well as a daily antihistamine.  Would like to see him back in the office in 2 weeks to see if he has seen significant improvement, I would like to repeat the nasal endoscopy exam to assure no suspicious lesions or sources of the epistaxis the patient is happy with today's plan, he verbalized understanding and agreement to today's plan had no further questions or concerns.  Strict return precautions given.   - f/u 2 weeks   Thank you for allowing me the opportunity to care for your patient. Please do not hesitate to contact me should you have any other questions.  Sincerely, Belma Boxer PA-C [facility] Phone: [phone] Fax: [phone]  11/16/2023, 4:15 PM

## 2023-11-17 NOTE — Telephone Encounter (Signed)
 NFN

## 2023-11-18 ENCOUNTER — Ambulatory Visit (INDEPENDENT_AMBULATORY_CARE_PROVIDER_SITE_OTHER): Payer: Medicaid Other

## 2023-11-18 DIAGNOSIS — I472 Ventricular tachycardia, unspecified: Secondary | ICD-10-CM

## 2023-11-19 ENCOUNTER — Ambulatory Visit (HOSPITAL_COMMUNITY)
Admission: RE | Admit: 2023-11-19 | Discharge: 2023-11-19 | Disposition: A | Discharge status: Home / Self Care | Source: Ambulatory Visit | Attending: Vascular Surgery | Admitting: Vascular Surgery

## 2023-11-19 ENCOUNTER — Ambulatory Visit
Admission: RE | Admit: 2023-11-19 | Discharge: 2023-11-19 | Disposition: A | Discharge status: Home / Self Care | Source: Ambulatory Visit | Attending: Internal Medicine | Admitting: Internal Medicine

## 2023-11-19 ENCOUNTER — Telehealth (HOSPITAL_COMMUNITY): Admitting: Psychiatry

## 2023-11-19 ENCOUNTER — Encounter (HOSPITAL_COMMUNITY): Payer: Self-pay | Admitting: Psychiatry

## 2023-11-19 DIAGNOSIS — M25462 Effusion, left knee: Secondary | ICD-10-CM

## 2023-11-19 DIAGNOSIS — I82622 Acute embolism and thrombosis of deep veins of left upper extremity: Secondary | ICD-10-CM | POA: Diagnosis present

## 2023-11-19 DIAGNOSIS — F411 Generalized anxiety disorder: Secondary | ICD-10-CM

## 2023-11-19 DIAGNOSIS — F4310 Post-traumatic stress disorder, unspecified: Secondary | ICD-10-CM

## 2023-11-19 LAB — CUP PACEART REMOTE DEVICE CHECK
Battery Remaining Longevity: 86 mo
Battery Remaining Percentage: 80 %
Battery Voltage: 3.01 V
Brady Statistic AP VP Percent: 1 %
Brady Statistic AP VS Percent: 16 %
Brady Statistic AS VP Percent: 1.1 %
Brady Statistic AS VS Percent: 83 %
Brady Statistic RA Percent Paced: 16 %
Brady Statistic RV Percent Paced: 1.5 %
Date Time Interrogation Session: 20250611020239
HighPow Impedance: 78 Ohm
Implantable Lead Connection Status: 753985
Implantable Lead Connection Status: 753985
Implantable Lead Implant Date: 20150717
Implantable Lead Implant Date: 20231207
Implantable Lead Location: 753859
Implantable Lead Location: 753860
Implantable Pulse Generator Implant Date: 20231207
Lead Channel Impedance Value: 350 Ohm
Lead Channel Impedance Value: 410 Ohm
Lead Channel Pacing Threshold Amplitude: 0.375 V
Lead Channel Pacing Threshold Amplitude: 0.75 V
Lead Channel Pacing Threshold Pulse Width: 0.5 ms
Lead Channel Pacing Threshold Pulse Width: 0.5 ms
Lead Channel Sensing Intrinsic Amplitude: 12 mV
Lead Channel Sensing Intrinsic Amplitude: 2.2 mV
Lead Channel Setting Pacing Amplitude: 1.375
Lead Channel Setting Pacing Amplitude: 2 V
Lead Channel Setting Pacing Pulse Width: 0.5 ms
Lead Channel Setting Sensing Sensitivity: 0.5 mV
Pulse Gen Serial Number: 810051574
Zone Setting Status: 755011

## 2023-11-19 MED ORDER — SERTRALINE HCL 50 MG PO TABS: 75.0000 mg | ORAL_TABLET | Freq: Every morning | ORAL | 2 refills | Status: DC

## 2023-11-20 ENCOUNTER — Ambulatory Visit: Payer: Self-pay | Admitting: Cardiology

## 2023-11-20 ENCOUNTER — Encounter: Payer: Self-pay | Admitting: Cardiology

## 2023-11-23 ENCOUNTER — Encounter: Payer: Self-pay | Admitting: Internal Medicine

## 2023-11-24 ENCOUNTER — Encounter: Payer: Self-pay | Admitting: Hematology and Oncology

## 2023-11-24 ENCOUNTER — Other Ambulatory Visit (HOSPITAL_COMMUNITY): Payer: Self-pay | Admitting: Family Medicine

## 2023-11-24 DIAGNOSIS — I5022 Chronic systolic (congestive) heart failure: Secondary | ICD-10-CM

## 2023-11-25 ENCOUNTER — Other Ambulatory Visit: Payer: Self-pay | Admitting: Internal Medicine

## 2023-11-25 DIAGNOSIS — E1142 Type 2 diabetes mellitus with diabetic polyneuropathy: Secondary | ICD-10-CM

## 2023-11-25 DIAGNOSIS — B356 Tinea cruris: Secondary | ICD-10-CM

## 2023-11-25 DIAGNOSIS — N138 Other obstructive and reflux uropathy: Secondary | ICD-10-CM

## 2023-11-25 MED ORDER — METHOCARBAMOL 500 MG PO TABS: 500.0000 mg | ORAL_TABLET | Freq: Every day | ORAL | 1 refills | Status: DC

## 2023-11-25 MED ORDER — GABAPENTIN 300 MG PO CAPS: 300.0000 mg | ORAL_CAPSULE | Freq: Every day | ORAL | 1 refills | Status: DC

## 2023-11-25 MED ORDER — TAMSULOSIN HCL 0.4 MG PO CAPS: 0.8000 mg | ORAL_CAPSULE | Freq: Every day | ORAL | 3 refills | Status: AC

## 2023-11-25 MED ORDER — NYSTATIN 100000 UNIT/GM EX POWD: 1.0000 | Freq: Three times a day (TID) | CUTANEOUS | 2 refills | Status: AC

## 2023-11-25 MED ORDER — METOLAZONE 5 MG PO TABS: 5.0000 mg | ORAL_TABLET | ORAL | 2 refills | Status: AC

## 2023-11-26 ENCOUNTER — Other Ambulatory Visit: Payer: Self-pay

## 2023-11-26 DIAGNOSIS — H40023 Open angle with borderline findings, high risk, bilateral: Secondary | ICD-10-CM | POA: Diagnosis not present

## 2023-11-26 DIAGNOSIS — H1013 Acute atopic conjunctivitis, bilateral: Secondary | ICD-10-CM | POA: Diagnosis not present

## 2023-11-26 DIAGNOSIS — E119 Type 2 diabetes mellitus without complications: Secondary | ICD-10-CM | POA: Diagnosis not present

## 2023-11-26 MED ORDER — APIXABAN 5 MG PO TABS: 5.0000 mg | ORAL_TABLET | Freq: Two times a day (BID) | ORAL | 3 refills | Status: DC

## 2023-11-27 ENCOUNTER — Ambulatory Visit: Payer: Self-pay | Admitting: Hematology and Oncology

## 2023-11-27 NOTE — Telephone Encounter (Signed)
 Pt given results per NP. He was given education on managing SVT at home, such as elevating his extremity and was advised to do this until sx subside. He knows to call us  should sx worsen or fail to improve.

## 2023-11-27 NOTE — Telephone Encounter (Signed)
-----   Message from [place] Iruku sent at 11/27/2023  8:35 AM EDT ----- No DVT in the upper extremity. There is a superficial vein thrombosis , no new recommendations for this, this will continue to improve. ----- Message ----- From: Interface, [employer] Sent: 11/19/2023   4:16 PM EDT To: Murleen Arms, MD

## 2023-11-29 ENCOUNTER — Encounter (HOSPITAL_COMMUNITY): Payer: Self-pay

## 2023-12-01 DIAGNOSIS — G4733 Obstructive sleep apnea (adult) (pediatric): Secondary | ICD-10-CM | POA: Diagnosis not present

## 2023-12-03 ENCOUNTER — Ambulatory Visit (INDEPENDENT_AMBULATORY_CARE_PROVIDER_SITE_OTHER): Admitting: Physician Assistant

## 2023-12-08 DIAGNOSIS — G4733 Obstructive sleep apnea (adult) (pediatric): Secondary | ICD-10-CM | POA: Diagnosis not present

## 2023-12-14 ENCOUNTER — Other Ambulatory Visit: Payer: Self-pay | Admitting: Internal Medicine

## 2023-12-14 ENCOUNTER — Encounter: Payer: Self-pay | Admitting: Internal Medicine

## 2023-12-14 DIAGNOSIS — E1169 Type 2 diabetes mellitus with other specified complication: Secondary | ICD-10-CM

## 2023-12-14 MED ORDER — ATORVASTATIN CALCIUM 10 MG PO TABS: 10.0000 mg | ORAL_TABLET | Freq: Every day | ORAL | 2 refills | Status: AC

## 2023-12-14 MED ORDER — METOPROLOL SUCCINATE ER 25 MG PO TB24: 12.5000 mg | ORAL_TABLET | Freq: Every day | ORAL | 3 refills | Status: DC

## 2023-12-15 ENCOUNTER — Other Ambulatory Visit: Payer: Self-pay | Admitting: Internal Medicine

## 2023-12-15 ENCOUNTER — Encounter: Payer: Self-pay | Admitting: Internal Medicine

## 2023-12-15 ENCOUNTER — Ambulatory Visit (INDEPENDENT_AMBULATORY_CARE_PROVIDER_SITE_OTHER): Admitting: Internal Medicine

## 2023-12-15 VITALS — BP 120/70 | HR 89 | Ht 72.0 in | Wt 289.2 lb

## 2023-12-15 DIAGNOSIS — E1165 Type 2 diabetes mellitus with hyperglycemia: Secondary | ICD-10-CM

## 2023-12-15 DIAGNOSIS — E1159 Type 2 diabetes mellitus with other circulatory complications: Secondary | ICD-10-CM | POA: Diagnosis not present

## 2023-12-15 DIAGNOSIS — E785 Hyperlipidemia, unspecified: Secondary | ICD-10-CM | POA: Diagnosis not present

## 2023-12-15 DIAGNOSIS — Z794 Long term (current) use of insulin: Secondary | ICD-10-CM | POA: Diagnosis not present

## 2023-12-15 DIAGNOSIS — E1169 Type 2 diabetes mellitus with other specified complication: Secondary | ICD-10-CM | POA: Diagnosis not present

## 2023-12-15 LAB — POCT GLYCOSYLATED HEMOGLOBIN (HGB A1C): Hemoglobin A1C: 8.5 % — AB (ref 4.0–5.6)

## 2023-12-15 MED ORDER — COLCHICINE 0.6 MG PO TABS: ORAL_TABLET | ORAL | 1 refills | Status: DC

## 2023-12-15 NOTE — Progress Notes (Signed)
 Patient ID: [name], male   DOB: [birth year], 58 y.o.   MRN: [id number]  HPI: [name] is a 58 y.o.-year-old male, initially referred by his PCP, Dr. Vicci, returning for follow-up for DM2, dx in 2010-15, insulin -dependent since 2023, uncontrolled, with complications (nonischemic cardiomyopathy, CHF with reduced ejection fraction, history of ventricular tachycardia, history of cardiac arrest, s/p ICD placement, stage IIIb CKD).  Last visit 5 months ago.  Interim history: No increased urination, blurry vision, nausea, chest pain.  He was previously drinking sweet tea but switched to cranberry juice before last visit.  He tells me that he stopped this, but would like to start it back. He continues to have significant shortness of breath that hinders his moving around.  He also has a cough, after he choked on his food yesterday.  No fever.  Reviewed HbA1c: Lab Results  Component Value Date   HGBA1C 9.3 (H) 10/08/2023   HGBA1C 8.9 (A) 07/09/2023   HGBA1C 9.3 (H) 04/03/2023   HGBA1C 9.1 (A) 02/13/2023   HGBA1C 8.9 12/12/2022   HGBA1C 7.9 (A) 09/18/2022   HGBA1C 10.2 (H) 05/04/2022   HGBA1C 15.0 (A) 01/30/2022   HGBA1C 13.0 (H) 08/26/2021   HGBA1C 8.9 (A) 03/19/2021   Pt is on a regimen of: - Farxiga  10 mg before breakfast >> stopped 09/2023 when admitted with ARF - Lantus  22 >> ...38 >> 45 >> after d/c: 34 units at bedtime - Novolog  12 >> .SABRASABRA 30-34 >> 30-38 units >> now, after d/c: 32 units 2x a day, before meals He reported diarrhea after increased Trulicity  >> hypokalemia >> cardiac arrest - was in ICU for 16 days, had heart surgery >> now PTSD.  Pt checks his sugars >4x a day  - with the CGM receiver:  Previously:  Previously:  Lowest sugar was 69 >> 58 >> 70; he has hypoglycemia awareness at 70.  Highest sugar was 380 >> >400 >> >400.  Glucometer: Accu-Chek guide  Pt's meals are: - Breakfast: muffins, 3 eggs - Lunch: skips - Dinner: salad from [employer] - tuna,  onions, olives, crackers + cookie - Snacks: Previously also drinking regular sodas and sweet tea, which she stopped.  - + CKD - sees nephrology ([facility]), last BUN/creatinine:  Lab Results  Component Value Date   BUN 23 (H) 11/12/2023   BUN 15 10/23/2023   CREATININE 2.76 (H) 11/12/2023   CREATININE 2.27 (H) 10/23/2023  Off Farxiga  10 mg daily, off Entresto .   -+ HL; last set of lipids: Lab Results  Component Value Date   CHOL 166 02/13/2023   HDL 44 02/13/2023   LDLCALC 76 02/13/2023   TRIG 283 (H) 02/13/2023   CHOLHDL 3.8 02/13/2023  On Lipitor 10 mg daily.  - last eye exam was on 06/2023. No DR  repotedly. Sees Dr. Octavia.  - no numbness and tingling in his feet. On Neurontin  300 mg at bedtime.  Last foot exam: by Dr. Lamount 08/20/2023.  Pt has FH of DM in mother and father.  He also has a history of HTN, kidney stones, GERD, IBS, gout, chronic low back pain (spondylosis and spinal stenosis), Since 04/2022, after his cardiac arrest, he has been SOB and has gained 30 lbs.  He also has anxiety and PTSD.  He has insomnia.  His psychiatrist started him on trazodone , but this is not working. He is on amiodarone , ASA 81.  ROS: + See HPI  Past Medical History:  Diagnosis Date   AICD (automatic cardioverter/defibrillator) present  Chronic bronchitis (HCC)    get it most q yr (12/23/2013)   Chronic systolic (congestive) heart failure (HCC)    Fracture of left humerus    a. 07/2013.   GERD (gastroesophageal reflux disease)    not at present time   Heart murmur    born w/it    History of renal calculi    Hypertension    Kidney stones    NICM (nonischemic cardiomyopathy) (HCC)    a. 07/2013 Echo: EF 20-25%, diff HK, Gr2 DD, mild MR, mod dil LA/RA. 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    Shockable heart rhythm detected by automated external defibrillator    Syncope    Type II diabetes mellitus  (HCC) 2006   Past Surgical History:  Procedure Laterality Date   CARDIAC CATHETERIZATION  08/10/13   CHOLECYSTECTOMY  ~ 2010   IMPLANTABLE CARDIOVERTER DEFIBRILLATOR IMPLANT  12/23/2013   STJ Fortify ICD implanted by Dr Kelsie for cardiomyopathy and syncope   IMPLANTABLE CARDIOVERTER DEFIBRILLATOR IMPLANT N/A 12/23/2013   Procedure: IMPLANTABLE CARDIOVERTER DEFIBRILLATOR IMPLANT;  Surgeon: Lynwood JONETTA Kelsie, MD;  Location[facility] CATH LAB;  Service: Cardiovascular;  Laterality: N/A;   INGUINAL HERNIA REPAIR Left 03/01/2018   Procedure: OPEN REPAIR OF LEFT INGUINAL HERNIA WITH MESH;  Surgeon: Tanda Locus, MD;  Location: [facility] ORS;  Service: General;  Laterality: Left;   LEAD REVISION/REPAIR N/A 05/15/2022   Procedure: LEAD REVISION/REPAIR;  Surgeon: [name] Ole DASEN, MD;  Location: [facility] INVASIVE CV LAB;  Service: Cardiovascular;  Laterality: N/A;   LEFT HEART CATHETERIZATION WITH CORONARY ANGIOGRAM N/A 08/10/2013   Procedure: LEFT HEART CATHETERIZATION WITH CORONARY ANGIOGRAM;  Surgeon: Candyce GORMAN Reek, MD;  Location: [facility] CATH LAB;  Service: Cardiovascular;  Laterality: N/A;   ORIF HUMERUS FRACTURE Left 08/12/2013   Procedure: OPEN REDUCTION INTERNAL FIXATION (ORIF) HUMERAL SHAFT FRACTURE;  Surgeon: Jerona LULLA Sage, MD;  Location: [facility] OR;  Service: Orthopedics;  Laterality: Left;  Open Reduction Internal Fixation Left Humerus   RIGHT/LEFT HEART CATH AND CORONARY ANGIOGRAPHY N/A 05/12/2022   Procedure: RIGHT/LEFT HEART CATH AND CORONARY ANGIOGRAPHY;  Surgeon: Gardenia Led, DO;  Location: [facility] INVASIVE CV LAB;  Service: Cardiovascular;  Laterality: N/A;   URETEROSCOPY     laser for kidney stones   Social History   Socioeconomic History   Marital status: Single    Spouse name: Not on file   Number of children: Not on file   Years of education: Not on file   Highest education level: Not on file  Occupational History   Occupation: Disabled  Tobacco Use   Smoking status: Former    Current packs/day:  0.00    Average packs/day: 1 pack/day for 28.0 years (28.0 ttl pk-yrs)    Types: Cigarettes    Start date: 06/03/1986    Quit date: 06/03/2014    Years since quitting: 9.5   Smokeless tobacco: Never  Vaping Use   Vaping status: Never Used  Substance and Sexual Activity   Alcohol use: Not Currently   Drug use: Yes    Types: Marijuana    Comment: occasionally;   Sexual activity: Yes  Other Topics Concern   Not on file  Social History Narrative   Pt lives with his brother    Social Drivers of Health   Financial Resource Strain: Low Risk  (05/25/2023)   Overall Financial Resource Strain (CARDIA)    Difficulty of Paying Living Expenses:  Not hard at all  Recent Concern: Financial Resource Strain - High Risk (03/24/2023)   Overall Financial Resource Strain (CARDIA)    Difficulty of Paying Living Expenses: Hard  Food Insecurity: No Food Insecurity (10/08/2023)   Hunger Vital Sign    Worried About Running Out of Food in the Last Year: Never true    Ran Out of Food in the Last Year: Never true  Transportation Needs: No Transportation Needs (10/08/2023)   PRAPARE - Administrator, Civil Service (Medical): No    Lack of Transportation (Non-Medical): No  Physical Activity: Inactive (05/25/2023)   Exercise Vital Sign    Days of Exercise per Week: 0 days    Minutes of Exercise per Session: 0 min  Stress: Stress Concern Present (05/25/2023)   [employer] of Occupational Health - Occupational Stress Questionnaire    Feeling of Stress : Rather much  Social Connections: Moderately Integrated (10/08/2023)   Social Connection and Isolation Panel    Frequency of Communication with Friends and Family: More than three times a week    Frequency of Social Gatherings with Friends and Family: More than three times a week    Attends Religious Services: More than 4 times per year    Active Member of [employer] or Organizations: Yes    Attends Banker Meetings: 1 to 4 times per  year    Marital Status: Never married  Intimate Partner Violence: Not At Risk (10/08/2023)   Humiliation, Afraid, Rape, and Kick questionnaire    Fear of Current or Ex-Partner: No    Emotionally Abused: No    Physically Abused: No    Sexually Abused: No   Current Outpatient Medications on File Prior to Visit  Medication Sig Dispense Refill   acetaminophen  (TYLENOL ) 500 MG tablet Take 1,000 mg by mouth every 8 (eight) hours as needed.     amiodarone  (PACERONE ) 200 MG tablet Take 2 tablets (400 mg total) by mouth daily. 180 tablet 3   apixaban  (ELIQUIS ) 5 MG TABS tablet Take 1 tablet (5 mg total) by mouth 2 (two) times daily. 60 tablet 3   atorvastatin  (LIPITOR) 10 MG tablet Take 1 tablet (10 mg total) by mouth daily. 90 tablet 2   cetirizine  (EQ ALLERGY RELIEF, CETIRIZINE ,) 10 MG tablet Take 1 tablet (10 mg total) by mouth daily. 90 tablet 1   cholecalciferol  (VITAMIN D3) 25 MCG (1000 UNIT) tablet Take 1,000 Units by mouth in the morning.     clonazePAM  (KLONOPIN ) 0.5 MG tablet Take 1 tablet (0.5 mg total) by mouth 2 (two) times daily as needed for anxiety. (Patient taking differently: Take 0.5 mg by mouth 2 (two) times daily.) 60 tablet 1   Clotrimazole  1 % OINT Apply to the affected area twice a day (Patient taking differently: Apply 1 Application topically 2 (two) times daily as needed. Apply to the affected area twice a day) 56.7 g 0   EPINEPHrine  0.3 mg/0.3 mL IJ SOAJ injection Inject 0.3 mg into the muscle as needed. 1 each 1   famotidine  (PEPCID ) 20 MG tablet TAKE 1 TABLET BY MOUTH 1 TO 2 TIMES DAILY AS NEEDED 180 tablet 0   fluticasone  (FLONASE ) 50 MCG/ACT nasal spray Place 2 sprays into both nostrils daily. 16 g 6   gabapentin  (NEURONTIN ) 300 MG capsule Take 1 capsule (300 mg total) by mouth at bedtime. 90 capsule 1   hydrALAZINE  (APRESOLINE ) 25 MG tablet Take 0.5 tablets (12.5 mg total) by mouth 3 (three) times daily.  90 tablet 6   insulin  aspart (NOVOLOG  FLEXPEN) 100 UNIT/ML FlexPen  Inject 10-20 Units into the skin 3 (three) times daily before meals. 60 mL 3   insulin  glargine (LANTUS  SOLOSTAR) 100 UNIT/ML Solostar Pen Inject 45 Units into the skin daily. (Patient taking differently: Inject 30 Units into the skin at bedtime.) 45 mL 3   Insulin  Pen Needle (TECHLITE PEN NEEDLES) 32G X 4 MM MISC Use to inject insulin  into the skin 3 times per day. 100 each 0   loratadine  (CLARITIN ) 10 MG tablet Take 1 tablet (10 mg total) by mouth daily. 30 tablet 11   methocarbamol  (ROBAXIN ) 500 MG tablet Take 1 tablet (500 mg total) by mouth daily. 90 tablet 1   metolazone  (ZAROXOLYN ) 5 MG tablet Take 1 tablet (5 mg total) by mouth once a week. 12 tablet 2   metoprolol  succinate (TOPROL -XL) 25 MG 24 hr tablet Take 0.5 tablets (12.5 mg total) by mouth daily. 45 tablet 3   nystatin  (MYCOSTATIN /NYSTOP ) powder Apply 1 Application topically 3 (three) times daily. 60 g 2   Polyethyl Glycol-Propyl Glycol (SYSTANE) 0.4-0.3 % SOLN Place 1 drop into both eyes daily as needed (dry eyes).     potassium chloride  SA (KLOR-CON  M) 20 MEQ tablet Take 2 tablets (40 mEq total) by mouth daily. 180 tablet 2   sertraline  (ZOLOFT ) 50 MG tablet Take 1.5 tablets (75 mg total) by mouth in the morning. 45 tablet 2   SYMBICORT  160-4.5 MCG/ACT inhaler Inhale 2 puffs into the lungs daily.     tamsulosin  (FLOMAX ) 0.4 MG CAPS capsule Take 2 capsules (0.8 mg total) by mouth daily. 180 capsule 3   torsemide  (DEMADEX ) 20 MG tablet Take 2 tablets (40 mg total) by mouth daily. 180 tablet 3   No current facility-administered medications on file prior to visit.   Allergies  Allergen Reactions   Bee Venom Anaphylaxis   Trulicity  [Dulaglutide ] Anaphylaxis and Diarrhea    Extra dose of Trulicity  caused cardiac arrest. Patient experience onset of diarrhea with using this medication - cleared after med stopped.   Family History  Problem Relation Age of Onset   Diabetes Father    Arthritis Father    Hypertension Father     Diabetes Mother    Arthritis Mother    Hypertension Mother    Hypertension Brother    PE: There were no vitals taken for this visit. Wt Readings from Last 20 Encounters:  11/16/23 282 lb (127.9 kg)  11/06/23 292 lb 12.8 oz (132.8 kg)  10/23/23 290 lb 12.8 oz (131.9 kg)  10/13/23 280 lb 1.6 oz (127.1 kg)  09/07/23 283 lb 15.2 oz (128.8 kg)  09/07/23 284 lb (128.8 kg)  08/26/23 284 lb (128.8 kg)  08/24/23 295 lb 3.2 oz (133.9 kg)  08/13/23 297 lb 9.6 oz (135 kg)  08/07/23 293 lb 9.6 oz (133.2 kg)  07/30/23 296 lb 2 oz (134.3 kg)  07/09/23 299 lb 3.2 oz (135.7 kg)  06/30/23 (!) 301 lb 6.4 oz (136.7 kg)  06/29/23 (!) 300 lb 12.8 oz (136.4 kg)  05/25/23 297 lb (134.7 kg)  05/21/23 294 lb 3.2 oz (133.4 kg)  05/13/23 297 lb 9.6 oz (135 kg)  05/06/23 299 lb (135.6 kg)  04/26/23 290 lb 9.6 oz (131.8 kg)  04/24/23 294 lb (133.4 kg)   Constitutional: overweight, in NAD, short of breath, has difficulty moving to examiner's table Eyes:  EOMI, no exophthalmos ENT: no neck masses, no cervical lymphadenopathy Cardiovascular: RRR, No MRG Respiratory:  CTA B Musculoskeletal: no deformities Skin:no rashes Neurological: no tremor with outstretched hands  ASSESSMENT: 1. DM2, insulin -dependent, uncontrolled, with long-term complications: - NICM - CHF with reduced ejection fraction-20 to 25% 08/2022 - history of VT + cardiac arrest 04/2022 (after developing diarrhea from increasing Trulicity  dose), s/p ICD placement - stage IIIb CKD  PLAN:  1. Patient with longstanding, uncontrolled, type 2 diabetes, basal-bolus insulin  regimen with still poor control.  At last visit, HbA1c was higher, at 8.9% but since then he had another HbA1c obtained 2 months ago and this was even higher, at 9.3%.  At last visit, sugars were very high, almost entirely above target, widely fluctuating during the night and then improving during the day.  The sugars increased significantly after 9 PM and peaking around 5 AM.   Upon questioning, he was on prednisone , which he was taking at night.  I advised him to move this to the morning.  We increased both of his insulin  doses. -Of note, he was intolerant to GLP-1 receptor agonist in the past. CGM interpretation: -At today's visit, we reviewed his CGM downloads: It appears that 22% of values are in target range (goal >70%), while 77% are higher than 180 (goal <25%), and 1% are lower than 70 (goal <4%).  The calculated average blood sugar is 222.  The projected HbA1c for the next 3 months (GMI) is 8.6%. -Reviewing the CGM trends, sugars appear to be fluctuating above the target range, but they increase significantly after dinner.  He tells me that he is forgetting his insulin  before meals and takes it after occasionally.  As a consequence, he may drop his blood sugars quite abruptly after meals.  We discussed about trying to always bolus insulin  15 minutes before the meals but if he had to take it afterwards, to only take half of the amount to avoid late postprandial hypoglycemia.  He is also taking a lower dose of Lantus  compared to last visit, after his hospitalization, and we will increase this today.  He is not very good dose of NovoLog  based on the size of his meals and I strongly advised him to do so.  I also strongly advised him to not start back any sweet drinks. -At last visit, he was also on Farxiga , but he tells me that this was stopped in the hospital when he was admitted with acute renal failure and a creatinine of 4.5.  He was not able to see his nephrologist since he was discharged and I advised him to check with him if he could restart Farxiga . - I suggested to:  Patient Instructions  Please increase: - Lantus  40 units at bedtime - Novolog  30-38 units 2x a day, 15 min before meals  If you have to take the NovoLog  after a meal, take only half of the amount.  Please check with your kidney doctor if you can go back on Farxiga .  Please return for another visit  in 3 months.   - we checked his HbA1c: 8.5% (lower) - advised to check sugars at different times of the day - 4x a day, rotating check times - advised for yearly eye exams >> he is UTD - return to clinic in 3 months  2. HL - Latest lipid panel from 02/2023 showed an LDL above our target of less than 55 and triglycerides also elevated: Lab Results  Component Value Date   CHOL 166 02/13/2023   HDL 44 02/13/2023   LDLCALC 76 02/13/2023   TRIG 283 (  H) 02/13/2023   CHOLHDL 3.8 02/13/2023  -He continues on Lipitor 10 mg daily without side effects   Lela Fendt, MD PhD [facility]

## 2023-12-15 NOTE — Patient Instructions (Addendum)
 Please increase: - Lantus  40 units at bedtime - Novolog  30-38 units 2x a day, 15 min before meals  If you have to take the NovoLog  after a meal, take only half of the amount.  Please check with your kidney doctor if you can go back on Farxiga .  Please return for another visit in 3 months.

## 2023-12-19 ENCOUNTER — Encounter (HOSPITAL_COMMUNITY): Payer: Self-pay | Admitting: *Deleted

## 2023-12-19 ENCOUNTER — Other Ambulatory Visit: Payer: Self-pay

## 2023-12-19 ENCOUNTER — Inpatient Hospital Stay (HOSPITAL_COMMUNITY)
Admission: EM | Admit: 2023-12-19 | Discharge: 2024-01-05 | DRG: 291 | Disposition: A | Discharge status: Home / Self Care | Attending: Family Medicine | Admitting: Family Medicine

## 2023-12-19 ENCOUNTER — Emergency Department (HOSPITAL_COMMUNITY)

## 2023-12-19 DIAGNOSIS — N184 Chronic kidney disease, stage 4 (severe): Secondary | ICD-10-CM | POA: Diagnosis present

## 2023-12-19 DIAGNOSIS — I5043 Acute on chronic combined systolic (congestive) and diastolic (congestive) heart failure: Secondary | ICD-10-CM | POA: Diagnosis not present

## 2023-12-19 DIAGNOSIS — Z95810 Presence of automatic (implantable) cardiac defibrillator: Secondary | ICD-10-CM | POA: Diagnosis not present

## 2023-12-19 DIAGNOSIS — Z7901 Long term (current) use of anticoagulants: Secondary | ICD-10-CM

## 2023-12-19 DIAGNOSIS — E876 Hypokalemia: Secondary | ICD-10-CM | POA: Diagnosis present

## 2023-12-19 DIAGNOSIS — E785 Hyperlipidemia, unspecified: Secondary | ICD-10-CM | POA: Diagnosis present

## 2023-12-19 DIAGNOSIS — I480 Paroxysmal atrial fibrillation: Secondary | ICD-10-CM | POA: Diagnosis present

## 2023-12-19 DIAGNOSIS — E1142 Type 2 diabetes mellitus with diabetic polyneuropathy: Secondary | ICD-10-CM | POA: Diagnosis present

## 2023-12-19 DIAGNOSIS — Z888 Allergy status to other drugs, medicaments and biological substances status: Secondary | ICD-10-CM

## 2023-12-19 DIAGNOSIS — Z950 Presence of cardiac pacemaker: Secondary | ICD-10-CM | POA: Diagnosis not present

## 2023-12-19 DIAGNOSIS — Z79899 Other long term (current) drug therapy: Secondary | ICD-10-CM

## 2023-12-19 DIAGNOSIS — N179 Acute kidney failure, unspecified: Secondary | ICD-10-CM | POA: Diagnosis present

## 2023-12-19 DIAGNOSIS — R079 Chest pain, unspecified: Secondary | ICD-10-CM | POA: Diagnosis not present

## 2023-12-19 DIAGNOSIS — I428 Other cardiomyopathies: Secondary | ICD-10-CM | POA: Diagnosis not present

## 2023-12-19 DIAGNOSIS — J441 Chronic obstructive pulmonary disease with (acute) exacerbation: Secondary | ICD-10-CM | POA: Diagnosis not present

## 2023-12-19 DIAGNOSIS — Z8674 Personal history of sudden cardiac arrest: Secondary | ICD-10-CM

## 2023-12-19 DIAGNOSIS — I82451 Acute embolism and thrombosis of right peroneal vein: Secondary | ICD-10-CM | POA: Diagnosis present

## 2023-12-19 DIAGNOSIS — E1169 Type 2 diabetes mellitus with other specified complication: Secondary | ICD-10-CM | POA: Diagnosis present

## 2023-12-19 DIAGNOSIS — Z6839 Body mass index (BMI) 39.0-39.9, adult: Secondary | ICD-10-CM

## 2023-12-19 DIAGNOSIS — U099 Post covid-19 condition, unspecified: Secondary | ICD-10-CM | POA: Diagnosis present

## 2023-12-19 DIAGNOSIS — Z66 Do not resuscitate: Secondary | ICD-10-CM | POA: Diagnosis present

## 2023-12-19 DIAGNOSIS — Z87891 Personal history of nicotine dependence: Secondary | ICD-10-CM

## 2023-12-19 DIAGNOSIS — F32A Depression, unspecified: Secondary | ICD-10-CM | POA: Diagnosis present

## 2023-12-19 DIAGNOSIS — Z794 Long term (current) use of insulin: Secondary | ICD-10-CM

## 2023-12-19 DIAGNOSIS — I472 Ventricular tachycardia, unspecified: Secondary | ICD-10-CM | POA: Diagnosis present

## 2023-12-19 DIAGNOSIS — G4733 Obstructive sleep apnea (adult) (pediatric): Secondary | ICD-10-CM | POA: Diagnosis present

## 2023-12-19 DIAGNOSIS — E1165 Type 2 diabetes mellitus with hyperglycemia: Secondary | ICD-10-CM | POA: Diagnosis not present

## 2023-12-19 DIAGNOSIS — N400 Enlarged prostate without lower urinary tract symptoms: Secondary | ICD-10-CM | POA: Diagnosis present

## 2023-12-19 DIAGNOSIS — D72829 Elevated white blood cell count, unspecified: Secondary | ICD-10-CM | POA: Diagnosis present

## 2023-12-19 DIAGNOSIS — Z5986 Financial insecurity: Secondary | ICD-10-CM

## 2023-12-19 DIAGNOSIS — K449 Diaphragmatic hernia without obstruction or gangrene: Secondary | ICD-10-CM | POA: Diagnosis not present

## 2023-12-19 DIAGNOSIS — M19012 Primary osteoarthritis, left shoulder: Secondary | ICD-10-CM | POA: Diagnosis not present

## 2023-12-19 DIAGNOSIS — Z9049 Acquired absence of other specified parts of digestive tract: Secondary | ICD-10-CM

## 2023-12-19 DIAGNOSIS — I2510 Atherosclerotic heart disease of native coronary artery without angina pectoris: Secondary | ICD-10-CM | POA: Diagnosis present

## 2023-12-19 DIAGNOSIS — M109 Gout, unspecified: Secondary | ICD-10-CM | POA: Diagnosis present

## 2023-12-19 DIAGNOSIS — K219 Gastro-esophageal reflux disease without esophagitis: Secondary | ICD-10-CM | POA: Diagnosis present

## 2023-12-19 DIAGNOSIS — I5023 Acute on chronic systolic (congestive) heart failure: Secondary | ICD-10-CM | POA: Diagnosis not present

## 2023-12-19 DIAGNOSIS — M7989 Other specified soft tissue disorders: Secondary | ICD-10-CM | POA: Diagnosis not present

## 2023-12-19 DIAGNOSIS — Z87442 Personal history of urinary calculi: Secondary | ICD-10-CM

## 2023-12-19 DIAGNOSIS — E669 Obesity, unspecified: Secondary | ICD-10-CM | POA: Diagnosis present

## 2023-12-19 DIAGNOSIS — Z7951 Long term (current) use of inhaled steroids: Secondary | ICD-10-CM

## 2023-12-19 DIAGNOSIS — Y92239 Unspecified place in hospital as the place of occurrence of the external cause: Secondary | ICD-10-CM | POA: Diagnosis not present

## 2023-12-19 DIAGNOSIS — N1832 Chronic kidney disease, stage 3b: Secondary | ICD-10-CM | POA: Diagnosis present

## 2023-12-19 DIAGNOSIS — S42302A Unspecified fracture of shaft of humerus, left arm, initial encounter for closed fracture: Secondary | ICD-10-CM | POA: Diagnosis not present

## 2023-12-19 DIAGNOSIS — Z833 Family history of diabetes mellitus: Secondary | ICD-10-CM

## 2023-12-19 DIAGNOSIS — I509 Heart failure, unspecified: Secondary | ICD-10-CM | POA: Diagnosis present

## 2023-12-19 DIAGNOSIS — T380X5A Adverse effect of glucocorticoids and synthetic analogues, initial encounter: Secondary | ICD-10-CM | POA: Diagnosis not present

## 2023-12-19 DIAGNOSIS — R1319 Other dysphagia: Secondary | ICD-10-CM

## 2023-12-19 DIAGNOSIS — Z8249 Family history of ischemic heart disease and other diseases of the circulatory system: Secondary | ICD-10-CM

## 2023-12-19 DIAGNOSIS — I130 Hypertensive heart and chronic kidney disease with heart failure and stage 1 through stage 4 chronic kidney disease, or unspecified chronic kidney disease: Principal | ICD-10-CM | POA: Diagnosis present

## 2023-12-19 DIAGNOSIS — E1122 Type 2 diabetes mellitus with diabetic chronic kidney disease: Secondary | ICD-10-CM | POA: Diagnosis present

## 2023-12-19 DIAGNOSIS — J9601 Acute respiratory failure with hypoxia: Secondary | ICD-10-CM | POA: Diagnosis present

## 2023-12-19 DIAGNOSIS — J449 Chronic obstructive pulmonary disease, unspecified: Secondary | ICD-10-CM | POA: Diagnosis not present

## 2023-12-19 DIAGNOSIS — Z8261 Family history of arthritis: Secondary | ICD-10-CM

## 2023-12-19 DIAGNOSIS — E11649 Type 2 diabetes mellitus with hypoglycemia without coma: Secondary | ICD-10-CM | POA: Diagnosis not present

## 2023-12-19 DIAGNOSIS — I771 Stricture of artery: Secondary | ICD-10-CM | POA: Diagnosis not present

## 2023-12-19 DIAGNOSIS — I440 Atrioventricular block, first degree: Secondary | ICD-10-CM | POA: Diagnosis present

## 2023-12-19 DIAGNOSIS — Z91030 Bee allergy status: Secondary | ICD-10-CM

## 2023-12-19 DIAGNOSIS — I5022 Chronic systolic (congestive) heart failure: Secondary | ICD-10-CM | POA: Diagnosis not present

## 2023-12-19 DIAGNOSIS — R1314 Dysphagia, pharyngoesophageal phase: Secondary | ICD-10-CM | POA: Diagnosis present

## 2023-12-19 DIAGNOSIS — M25512 Pain in left shoulder: Secondary | ICD-10-CM | POA: Diagnosis present

## 2023-12-19 DIAGNOSIS — I517 Cardiomegaly: Secondary | ICD-10-CM | POA: Diagnosis not present

## 2023-12-19 DIAGNOSIS — R609 Edema, unspecified: Secondary | ICD-10-CM | POA: Diagnosis not present

## 2023-12-19 DIAGNOSIS — I4891 Unspecified atrial fibrillation: Secondary | ICD-10-CM | POA: Diagnosis not present

## 2023-12-19 LAB — CBC
HCT: 42.5 % (ref 39.0–52.0)
Hemoglobin: 14.1 g/dL (ref 13.0–17.0)
MCH: 30.5 pg (ref 26.0–34.0)
MCHC: 33.2 g/dL (ref 30.0–36.0)
MCV: 92 fL (ref 80.0–100.0)
Platelets: 329 K/uL (ref 150–400)
RBC: 4.62 MIL/uL (ref 4.22–5.81)
RDW: 15.5 % (ref 11.5–15.5)
WBC: 8.4 K/uL (ref 4.0–10.5)
nRBC: 0.2 % (ref 0.0–0.2)

## 2023-12-19 LAB — BASIC METABOLIC PANEL WITH GFR
Anion gap: 17 — ABNORMAL HIGH (ref 5–15)
BUN: 38 mg/dL — ABNORMAL HIGH (ref 6–20)
CO2: 27 mmol/L (ref 22–32)
Calcium: 9.7 mg/dL (ref 8.9–10.3)
Chloride: 93 mmol/L — ABNORMAL LOW (ref 98–111)
Creatinine, Ser: 3.27 mg/dL — ABNORMAL HIGH (ref 0.61–1.24)
GFR, Estimated: 21 mL/min — ABNORMAL LOW (ref >60)
Glucose, Bld: 252 mg/dL — ABNORMAL HIGH (ref 70–99)
Potassium: 3 mmol/L — ABNORMAL LOW (ref 3.5–5.1)
Sodium: 137 mmol/L (ref 135–145)

## 2023-12-19 LAB — BRAIN NATRIURETIC PEPTIDE: B Natriuretic Peptide: 27 pg/mL (ref 0.0–100.0)

## 2023-12-19 LAB — CBG MONITORING, ED: Glucose-Capillary: 291 mg/dL — ABNORMAL HIGH (ref 70–99)

## 2023-12-19 LAB — TROPONIN I (HIGH SENSITIVITY)
Troponin I (High Sensitivity): 18 ng/L — ABNORMAL HIGH (ref <18)
Troponin I (High Sensitivity): 17 ng/L (ref <18)

## 2023-12-19 MED ORDER — SODIUM CHLORIDE 0.9 % IV SOLN: 250.0000 mL | INTRAVENOUS | Status: AC | PRN

## 2023-12-19 MED ORDER — TAMSULOSIN HCL 0.4 MG PO CAPS
0.8000 mg | ORAL_CAPSULE | Freq: Every day | ORAL | Status: DC
  Administered 2023-12-20 – 2024-01-05 (×17): 0.8 mg via ORAL
  Filled 2023-12-19 (×18): qty 2

## 2023-12-19 MED ORDER — PREDNISONE 20 MG PO TABS
50.0000 mg | ORAL_TABLET | Freq: Every day | ORAL | Status: DC
  Administered 2023-12-20: 50 mg via ORAL
  Filled 2023-12-19: qty 3

## 2023-12-19 MED ORDER — FUROSEMIDE 10 MG/ML IJ SOLN
40.0000 mg | Freq: Every day | INTRAMUSCULAR | Status: DC
  Filled 2023-12-19: qty 4

## 2023-12-19 MED ORDER — METHYLPREDNISOLONE SODIUM SUCC 125 MG IJ SOLR
125.0000 mg | Freq: Once | INTRAMUSCULAR | Status: AC
  Administered 2023-12-19: 125 mg via INTRAVENOUS
  Filled 2023-12-19: qty 2

## 2023-12-19 MED ORDER — ONDANSETRON HCL 4 MG/2ML IJ SOLN
4.0000 mg | Freq: Four times a day (QID) | INTRAMUSCULAR | Status: DC | PRN
  Filled 2023-12-19: qty 2

## 2023-12-19 MED ORDER — AMIODARONE HCL 200 MG PO TABS
400.0000 mg | ORAL_TABLET | Freq: Every day | ORAL | Status: DC
  Administered 2023-12-20 – 2024-01-05 (×17): 400 mg via ORAL
  Filled 2023-12-19 (×17): qty 2

## 2023-12-19 MED ORDER — POTASSIUM CHLORIDE CRYS ER 20 MEQ PO TBCR
40.0000 meq | EXTENDED_RELEASE_TABLET | Freq: Once | ORAL | Status: AC
  Administered 2023-12-19: 40 meq via ORAL
  Filled 2023-12-19: qty 2

## 2023-12-19 MED ORDER — INSULIN ASPART 100 UNIT/ML IJ SOLN: 0.0000 [IU] | Freq: Three times a day (TID) | INTRAMUSCULAR | Status: DC

## 2023-12-19 MED ORDER — IPRATROPIUM-ALBUTEROL 0.5-2.5 (3) MG/3ML IN SOLN
3.0000 mL | Freq: Four times a day (QID) | RESPIRATORY_TRACT | Status: DC
  Administered 2023-12-19 – 2023-12-21 (×5): 3 mL via RESPIRATORY_TRACT
  Filled 2023-12-19 (×6): qty 3

## 2023-12-19 MED ORDER — ACETAMINOPHEN 500 MG PO TABS
500.0000 mg | ORAL_TABLET | Freq: Four times a day (QID) | ORAL | Status: DC | PRN
  Administered 2023-12-25 – 2024-01-05 (×3): 500 mg via ORAL
  Filled 2023-12-19 (×3): qty 1

## 2023-12-19 MED ORDER — GABAPENTIN 300 MG PO CAPS
300.0000 mg | ORAL_CAPSULE | Freq: Every day | ORAL | Status: DC
  Administered 2023-12-19 – 2024-01-04 (×17): 300 mg via ORAL
  Filled 2023-12-19 (×17): qty 1

## 2023-12-19 MED ORDER — ALBUTEROL SULFATE (2.5 MG/3ML) 0.083% IN NEBU
5.0000 mg | INHALATION_SOLUTION | Freq: Once | RESPIRATORY_TRACT | Status: AC
  Administered 2023-12-19: 5 mg via RESPIRATORY_TRACT
  Filled 2023-12-19: qty 6

## 2023-12-19 MED ORDER — METOPROLOL SUCCINATE ER 25 MG PO TB24
12.5000 mg | ORAL_TABLET | Freq: Every day | ORAL | Status: DC
Start: 2023-12-20 — End: 2024-01-05
  Administered 2023-12-20 – 2024-01-05 (×17): 12.5 mg via ORAL
  Filled 2023-12-19 (×17): qty 1

## 2023-12-19 MED ORDER — APIXABAN 5 MG PO TABS
5.0000 mg | ORAL_TABLET | Freq: Two times a day (BID) | ORAL | Status: DC
  Administered 2023-12-19 – 2023-12-27 (×16): 5 mg via ORAL
  Filled 2023-12-19 (×16): qty 1

## 2023-12-19 MED ORDER — SERTRALINE HCL 50 MG PO TABS
75.0000 mg | ORAL_TABLET | Freq: Every morning | ORAL | Status: DC
  Administered 2023-12-20 – 2024-01-05 (×17): 75 mg via ORAL
  Filled 2023-12-19 (×17): qty 2

## 2023-12-19 MED ORDER — INSULIN GLARGINE-YFGN 100 UNIT/ML ~~LOC~~ SOLN
20.0000 [IU] | Freq: Every day | SUBCUTANEOUS | Status: DC
  Administered 2023-12-19: 20 [IU] via SUBCUTANEOUS
  Filled 2023-12-19 (×2): qty 0.2

## 2023-12-19 MED ORDER — TIMOLOL MALEATE 0.5 % OP SOLN
1.0000 [drp] | Freq: Every morning | OPHTHALMIC | Status: DC
  Administered 2023-12-20 – 2024-01-05 (×10): 1 [drp] via OPHTHALMIC
  Filled 2023-12-19 (×2): qty 5

## 2023-12-19 MED ORDER — SODIUM CHLORIDE 0.9% FLUSH
3.0000 mL | Freq: Two times a day (BID) | INTRAVENOUS | Status: DC
  Administered 2023-12-20 – 2024-01-05 (×30): 3 mL via INTRAVENOUS

## 2023-12-19 MED ORDER — ATORVASTATIN CALCIUM 10 MG PO TABS
10.0000 mg | ORAL_TABLET | Freq: Every day | ORAL | Status: DC
Start: 2023-12-19 — End: 2024-01-05
  Administered 2023-12-19 – 2024-01-05 (×18): 10 mg via ORAL
  Filled 2023-12-19 (×18): qty 1

## 2023-12-19 MED ORDER — CLONAZEPAM 0.5 MG PO TABS
0.5000 mg | ORAL_TABLET | Freq: Two times a day (BID) | ORAL | Status: DC
  Administered 2023-12-19 – 2024-01-04 (×33): 0.5 mg via ORAL
  Filled 2023-12-19 (×34): qty 1

## 2023-12-19 MED ORDER — INSULIN GLARGINE 100 UNIT/ML ~~LOC~~ SOLN
20.0000 [IU] | Freq: Every day | SUBCUTANEOUS | Status: DC
  Filled 2023-12-19: qty 0.2

## 2023-12-19 MED ORDER — ALBUTEROL SULFATE (2.5 MG/3ML) 0.083% IN NEBU: 2.5000 mg | INHALATION_SOLUTION | RESPIRATORY_TRACT | Status: DC | PRN

## 2023-12-19 MED ORDER — SODIUM CHLORIDE 0.9% FLUSH
3.0000 mL | INTRAVENOUS | Status: DC | PRN
Start: 2023-12-19 — End: 2024-01-05

## 2023-12-19 MED ORDER — POTASSIUM CHLORIDE CRYS ER 20 MEQ PO TBCR
40.0000 meq | EXTENDED_RELEASE_TABLET | Freq: Every day | ORAL | Status: DC
  Administered 2023-12-20 – 2024-01-05 (×17): 40 meq via ORAL
  Filled 2023-12-19 (×17): qty 2

## 2023-12-19 MED ORDER — IPRATROPIUM BROMIDE 0.02 % IN SOLN
0.5000 mg | Freq: Once | RESPIRATORY_TRACT | Status: AC
  Administered 2023-12-19: 0.5 mg via RESPIRATORY_TRACT
  Filled 2023-12-19: qty 2.5

## 2023-12-19 MED ORDER — BENZONATATE 100 MG PO CAPS: 200.0000 mg | ORAL_CAPSULE | Freq: Two times a day (BID) | ORAL | Status: DC | PRN

## 2023-12-19 NOTE — ED Triage Notes (Signed)
 The pt is c/o chest pain  and he has sob swollen feet and ankles swollen all for 3-4 days  hx copd

## 2023-12-19 NOTE — ED Provider Notes (Signed)
 [place] EMERGENCY DEPARTMENT AT [facility] Provider Note   CSN: [id number] Arrival date & time: 12/19/23  1726     Patient presents with: Chest Pain   [name] is a 58 y.o. male history of heart failure, A-fib on Eliquis  and amiodarone , COPD here presenting with chest pain and shortness of breath.  He states that several days ago he may have aspirated on some food and has been coughing.  Patient also noticed bilateral foot swelling and worsening shortness of breath.  Patient lives with his brother and denies any fevers or chills.  Patient was admitted to the hospital several months ago for possible pneumonia and AKI.  Patient states that he is also taking torsemide  currently for his heart failure.   The history is provided by the patient.       Prior to Admission medications   Medication Sig Start Date End Date Taking? Authorizing Provider  acetaminophen  (TYLENOL ) 500 MG tablet Take 1,000 mg by mouth every 8 (eight) hours as needed.    [provider]  amiodarone  (PACERONE ) 200 MG tablet Take 2 tablets (400 mg total) by mouth daily. 10/13/23   Regalado, Belkys A, MD  apixaban  (ELIQUIS ) 5 MG TABS tablet Take 1 tablet (5 mg total) by mouth 2 (two) times daily. 11/26/23   Iruku, Praveena, MD  atorvastatin  (LIPITOR) 10 MG tablet Take 1 tablet (10 mg total) by mouth daily. 12/14/23   Vicci Barnie NOVAK, MD  cetirizine  [facility] ALLERGY RELIEF, CETIRIZINE ,) 10 MG tablet Take 1 tablet (10 mg total) by mouth daily. 09/30/23   Vicci Barnie NOVAK, MD  cholecalciferol  (VITAMIN D3) 25 MCG (1000 UNIT) tablet Take 1,000 Units by mouth in the morning.    [provider]  clonazePAM  (KLONOPIN ) 0.5 MG tablet Take 1 tablet (0.5 mg total) by mouth 2 (two) times daily as needed for anxiety. Patient taking differently: Take 0.5 mg by mouth 2 (two) times daily. 10/30/23 10/29/24  Carvin Arvella HERO, MD  Clotrimazole  1 % OINT Apply to the affected area twice a day Patient taking  differently: Apply 1 Application topically 2 (two) times daily as needed. Apply to the affected area twice a day 09/09/23   Newlin, Enobong, MD  colchicine  0.6 MG tablet 1/2 tab PO Q Mon/Wed/Fri 12/15/23   Vicci Barnie NOVAK, MD  EPINEPHrine  0.3 mg/0.3 mL IJ SOAJ injection Inject 0.3 mg into the muscle as needed. 01/30/22   Iva Marty Saltness, MD  famotidine  (PEPCID ) 20 MG tablet TAKE 1 TABLET BY MOUTH 1 TO 2 TIMES DAILY AS NEEDED 10/16/23   Ambs, Arlean HERO, FNP  fluticasone  (FLONASE ) 50 MCG/ACT nasal spray Place 2 sprays into both nostrils daily. 11/16/23   Hedges, Reyes, PA-C  gabapentin  (NEURONTIN ) 300 MG capsule Take 1 capsule (300 mg total) by mouth at bedtime. 11/25/23   Vicci Barnie NOVAK, MD  hydrALAZINE  (APRESOLINE ) 25 MG tablet Take 0.5 tablets (12.5 mg total) by mouth 3 (three) times daily. 10/23/23 01/21/24  Glena Harlene HERO, FNP  insulin  aspart (NOVOLOG  FLEXPEN) 100 UNIT/ML FlexPen Inject 10-20 Units into the skin 3 (three) times daily before meals. 10/13/23   Regalado, Belkys A, MD  insulin  glargine (LANTUS  SOLOSTAR) 100 UNIT/ML Solostar Pen Inject 45 Units into the skin daily. Patient taking differently: Inject 30 Units into the skin at bedtime. 08/13/23   Trixie File, MD  Insulin  Pen Needle (TECHLITE PEN NEEDLES) 32G X 4 MM MISC Use to inject insulin  into the skin 3 times per day. 10/13/23  Regalado, Belkys A, MD  loratadine  (CLARITIN ) 10 MG tablet Take 1 tablet (10 mg total) by mouth daily. 11/16/23   Hedges, Reyes, PA-C  methocarbamol  (ROBAXIN ) 500 MG tablet Take 1 tablet (500 mg total) by mouth daily. 11/25/23   Vicci Barnie NOVAK, MD  metolazone  (ZAROXOLYN ) 5 MG tablet Take 1 tablet (5 mg total) by mouth once a week. 11/25/23 02/23/24  Vicci Barnie NOVAK, MD  metoprolol  succinate (TOPROL -XL) 25 MG 24 hr tablet Take 0.5 tablets (12.5 mg total) by mouth daily. 12/14/23   Vicci Barnie NOVAK, MD  nystatin  (MYCOSTATIN /NYSTOP ) powder Apply 1 Application topically 3 (three) times daily. 11/25/23    Vicci Barnie NOVAK, MD  Polyethyl Glycol-Propyl Glycol (SYSTANE) 0.4-0.3 % SOLN Place 1 drop into both eyes daily as needed (dry eyes).    [provider]  potassium chloride  SA (KLOR-CON  M) 20 MEQ tablet Take 2 tablets (40 mEq total) by mouth daily. 11/04/23   Milford, Harlene HERO, FNP  sertraline  (ZOLOFT ) 50 MG tablet Take 1.5 tablets (75 mg total) by mouth in the morning. 11/19/23 11/18/24  Carvin Arvella HERO, MD  SYMBICORT  160-4.5 MCG/ACT inhaler Inhale 2 puffs into the lungs daily. 08/28/23   [provider]  tamsulosin  (FLOMAX ) 0.4 MG CAPS capsule Take 2 capsules (0.8 mg total) by mouth daily. 11/25/23   Vicci Barnie NOVAK, MD  torsemide  (DEMADEX ) 20 MG tablet Take 2 tablets (40 mg total) by mouth daily. 11/04/23   Glena Harlene HERO, FNP    Allergies: Bee venom and Trulicity  [dulaglutide ]    Review of Systems  Cardiovascular:  Positive for chest pain.  All other systems reviewed and are negative.   Updated Vital Signs BP 127/86 (BP Location: Left Arm)   Pulse (!) 111   Temp 98.8 F (37.1 C)   Resp (!) 24   Ht 6' (1.829 m)   Wt 131.2 kg   SpO2 95%   BMI 39.23 kg/m   Physical Exam Vitals and nursing note reviewed.  Constitutional:      Comments: Slightly tachypneic  HENT:     Head: Normocephalic.  Cardiovascular:     Rate and Rhythm: Normal rate and regular rhythm.     Heart sounds: Normal heart sounds.  Pulmonary:     Comments: Wheezing bilaterally Abdominal:     General: Bowel sounds are normal.     Palpations: Abdomen is soft.  Musculoskeletal:        General: Normal range of motion.     Cervical back: Normal range of motion and neck supple.  Skin:    General: Skin is warm.  Neurological:     General: No focal deficit present.     Mental Status: He is oriented to person, place, and time.  Psychiatric:        Mood and Affect: Mood normal.        Behavior: Behavior normal.     (all labs ordered are listed, but only abnormal results are  displayed) Labs Reviewed  BASIC METABOLIC PANEL WITH GFR - Abnormal; Notable for the following components:      Result Value   Potassium 3.0 (*)    Chloride 93 (*)    Glucose, Bld 252 (*)    BUN 38 (*)    Creatinine, Ser 3.27 (*)    GFR, Estimated 21 (*)    Anion gap 17 (*)    All other components within normal limits  TROPONIN I (HIGH SENSITIVITY) - Abnormal; Notable for the following components:   Troponin  I (High Sensitivity) 18 (*)    All other components within normal limits  CBC  BRAIN NATRIURETIC PEPTIDE  TROPONIN I (HIGH SENSITIVITY)    EKG: EKG Interpretation Date/Time:  Saturday December 19 2023 17:42:17 EDT Ventricular Rate:  104 PR Interval:  194 QRS Duration:  136 QT Interval:  374 QTC Calculation: 491 R Axis:   242  Text Interpretation: Sinus tachycardia Right superior axis deviation Non-specific intra-ventricular conduction block Abnormal ECG When compared with ECG of 23-Oct-2023 10:11, PREVIOUS ECG IS PRESENT Confirmed by Patt Alm DEL ([id number]) on 12/19/2023 6:41:24 PM  Radiology: [facility] Chest Port 1 View Result Date: 12/19/2023 CLINICAL DATA:  Chest pain EXAM: PORTABLE CHEST 1 VIEW COMPARISON:  Chest x-ray 10/10/2023 FINDINGS: The cardiac silhouette is enlarged, unchanged. Left lung base has been excluded from the exam. Left-sided pacemaker is unchanged in position. The lungs are clear. There is no pneumothorax or acute fracture. IMPRESSION: 1. No active disease. 2. Cardiomegaly. Electronically Signed   By: Greig Pique M.D.   On: 12/19/2023 18:44     Procedures   Medications Ordered in the ED  albuterol  (PROVENTIL ) (2.5 MG/3ML) 0.083% nebulizer solution 5 mg (has no administration in time range)  ipratropium (ATROVENT ) nebulizer solution 0.5 mg (0.5 mg Nebulization Given 12/19/23 1925)  methylPREDNISolone  sodium succinate (SOLU-MEDROL ) 125 mg/2 mL injection 125 mg (125 mg Intravenous Given 12/19/23 1925)  potassium chloride  SA (KLOR-CON  M) CR tablet 40 mEq (40 mEq  Oral Given 12/19/23 1923)                                    Medical Decision Making [name] is a 58 y.o. male here presenting with shortness of breath and leg swelling.  Considered COPD exacerbation versus CHF versus pneumonia.  Plan to get CBC and CMP and BNP and chest x-ray.  Will give albuterol  and Solu-Medrol  and reassess  7:31 PM Reviewed patient's labs and CBC is unremarkable.  Patient's BNP is normal.  Patient's creatinine went up to 3.2 from 2.7.  Chest x-ray did not show any obvious pneumonia or pulmonary edema.  Patient is still wheezing despite nebs and Solu-Medrol .  Patient will be admitted for COPD exacerbation and AKI.  Patient appears euvolemic currently   Problems Addressed: AKI (acute kidney injury) [facility]): acute illness or injury Chronic obstructive pulmonary disease with acute exacerbation (HCC): acute illness or injury  Amount and/or Complexity of Data Reviewed Labs: ordered. Radiology: ordered.  Risk Prescription drug management.    Final diagnoses:  None    ED Discharge Orders     None          Patt Alm Macho, MD 12/19/23 [phone]

## 2023-12-19 NOTE — H&P (Signed)
 History and Physical    Patient: [name] FMW:[id number] DOB: 1966-05-05 DOA: 12/19/2023 DOS: the patient was seen and examined on 12/19/2023 PCP: Vicci Barnie NOVAK, MD  Patient coming from: Home  Chief Complaint:  Chief Complaint  Patient presents with   Chest Pain   HPI: [name] is a 58 y.o. male with medical history significant for history of cardiac arrest status post AICD placement, NICM, atrial fibrillation on anticoagulation, type 2 diabetes mellitus, CKD with baseline creatinine of 2.7 who presents with w worsening shortness of breath over the last couple of days.  This morning he said he could not breathe if at all so that prompted him to come in.  Approximately 7 days ago the patient was drinking some liquid and he got choked.  He thinks he aspirated.  The next morning he started coughing up a lot of phlegm.  It is a thick yellow phlegm which is green continue to get worse over the last week.  He denies any fevers or chills.  He started wheezing the day after this aspiration incident.  He does have inhalers which he uses at home.  Despite the phlegm and the wheezing he feels like he has too much fluid on board.  His weight is up to 290 where his baseline is 280.  And he says he just feels heavier.  At his last heart failure appointment 8 weeks ago he was started on a once pill to help get extra fluid off.  He has not noticed any difference since using that.  This morning in addition to having more trouble breathing he did feel pressure or heaviness in his left chest.  He always has some chest pain every day that is unchanged but the pressure was different this morning. In the emergency department the patient was wheezing on arrival and initially treated with nebs.  His wheezing improved his O2 sats improved so we were called to admit the patient for COPD exacerbation.  When I evaluated the patient he says his wheezing is improved but he still very short of breath and does not  feel significantly better.  He thinks that fluid is his main problem at this time.  His only swelling happens in his feet and in his left knee.  Those areas actually have been swollen since 3 days after he was last discharged from the hospital 2 months ago The patient lives with his brother and his brother reports that the patient has been extremely depressed and not following his diet.  The patient says his shortness of breath has gotten much worse and he can barely go from 1 room to another at this time and it is really affecting his mental health.     Review of Systems: As mentioned in the history of present illness. All other systems reviewed and are negative. Past Medical History:  Diagnosis Date   AICD (automatic cardioverter/defibrillator) present    Chronic bronchitis (HCC)    get it most q yr (12/23/2013)   Chronic systolic (congestive) heart failure (HCC)    Fracture of left humerus    a. 07/2013.   GERD (gastroesophageal reflux disease)    not at present time   Heart murmur    born w/it    History of renal calculi    Hypertension    Kidney stones    NICM (nonischemic cardiomyopathy) (HCC)    a. 07/2013 Echo: EF 20-25%, diff HK, Gr2 DD, mild MR, mod dil LA/RA. 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    Shockable heart rhythm detected by automated external defibrillator    Syncope    Type II diabetes mellitus (HCC) 2006   Past Surgical History:  Procedure Laterality Date   CARDIAC CATHETERIZATION  08/10/13   CHOLECYSTECTOMY  ~ 2010   IMPLANTABLE CARDIOVERTER DEFIBRILLATOR IMPLANT  12/23/2013   STJ Fortify ICD implanted by Dr Kelsie for cardiomyopathy and syncope   IMPLANTABLE CARDIOVERTER DEFIBRILLATOR IMPLANT N/A 12/23/2013   Procedure: IMPLANTABLE CARDIOVERTER DEFIBRILLATOR IMPLANT;  Surgeon: Lynwood JONETTA Kelsie, MD;  Location: [facility] CATH LAB;  Service: Cardiovascular;  Laterality: N/A;   INGUINAL HERNIA  REPAIR Left 03/01/2018   Procedure: OPEN REPAIR OF LEFT INGUINAL HERNIA WITH MESH;  Surgeon: Tanda Locus, MD;  Location: [facility] ORS;  Service: General;  Laterality: Left;   LEAD REVISION/REPAIR N/A 05/15/2022   Procedure: LEAD REVISION/REPAIR;  Surgeon: [name] Ole DASEN, MD;  Location: [facility] INVASIVE CV LAB;  Service: Cardiovascular;  Laterality: N/A;   LEFT HEART CATHETERIZATION WITH CORONARY ANGIOGRAM N/A 08/10/2013   Procedure: LEFT HEART CATHETERIZATION WITH CORONARY ANGIOGRAM;  Surgeon: Candyce GORMAN Reek, MD;  Location: [facility] CATH LAB;  Service: Cardiovascular;  Laterality: N/A;   ORIF HUMERUS FRACTURE Left 08/12/2013   Procedure: OPEN REDUCTION INTERNAL FIXATION (ORIF) HUMERAL SHAFT FRACTURE;  Surgeon: Jerona LULLA Sage, MD;  Location: [facility] OR;  Service: Orthopedics;  Laterality: Left;  Open Reduction Internal Fixation Left Humerus   RIGHT/LEFT HEART CATH AND CORONARY ANGIOGRAPHY N/A 05/12/2022   Procedure: RIGHT/LEFT HEART CATH AND CORONARY ANGIOGRAPHY;  Surgeon: Gardenia Led, DO;  Location: [facility] INVASIVE CV LAB;  Service: Cardiovascular;  Laterality: N/A;   URETEROSCOPY     laser for kidney stones   Social History:  reports that he quit smoking about 9 years ago. His smoking use included cigarettes. He started smoking about 37 years ago. He has a 28 pack-year smoking history. He has never used smokeless tobacco. He reports that he does not currently use alcohol. He reports current drug use. Drug: Marijuana.  Allergies  Allergen Reactions   Bee Venom Anaphylaxis   Trulicity  [Dulaglutide ] Anaphylaxis and Diarrhea    Extra dose of Trulicity  caused cardiac arrest. Patient experience onset of diarrhea with using this medication - cleared after med stopped.    Family History  Problem Relation Age of Onset   Diabetes Father    Arthritis Father    Hypertension Father    Diabetes Mother    Arthritis Mother    Hypertension Mother    Hypertension Brother     Prior to Admission medications    Medication Sig Start Date End Date Taking? Authorizing Provider  acetaminophen  (TYLENOL ) 500 MG tablet Take 1,000 mg by mouth every 8 (eight) hours as needed.    [provider]  amiodarone  (PACERONE ) 200 MG tablet Take 2 tablets (400 mg total) by mouth daily. 10/13/23   Regalado, Belkys A, MD  apixaban  (ELIQUIS ) 5 MG TABS tablet Take 1 tablet (5 mg total) by mouth 2 (two) times daily. 11/26/23   Iruku, Praveena, MD  atorvastatin  (LIPITOR) 10 MG tablet Take 1 tablet (10 mg total) by mouth daily. 12/14/23   Vicci Barnie NOVAK, MD  cetirizine  (EQ ALLERGY RELIEF, CETIRIZINE ,) 10 MG tablet Take 1 tablet (10 mg total) by mouth daily. 09/30/23   Vicci Barnie NOVAK, MD  cholecalciferol  (VITAMIN D3) 25 MCG (1000 UNIT) tablet Take 1,000 Units by mouth in the morning.  [provider]  clonazePAM  (KLONOPIN ) 0.5 MG tablet Take 1 tablet (0.5 mg total) by mouth 2 (two) times daily as needed for anxiety. Patient taking differently: Take 0.5 mg by mouth 2 (two) times daily. 10/30/23 10/29/24  Carvin Arvella HERO, MD  Clotrimazole  1 % OINT Apply to the affected area twice a day Patient taking differently: Apply 1 Application topically 2 (two) times daily as needed. Apply to the affected area twice a day 09/09/23   Newlin, Enobong, MD  colchicine  0.6 MG tablet 1/2 tab PO Q Mon/Wed/Fri 12/15/23   Vicci Barnie NOVAK, MD  EPINEPHrine  0.3 mg/0.3 mL IJ SOAJ injection Inject 0.3 mg into the muscle as needed. 01/30/22   Iva Marty Saltness, MD  famotidine  (PEPCID ) 20 MG tablet TAKE 1 TABLET BY MOUTH 1 TO 2 TIMES DAILY AS NEEDED 10/16/23   Ambs, Arlean HERO, FNP  fluticasone  (FLONASE ) 50 MCG/ACT nasal spray Place 2 sprays into both nostrils daily. 11/16/23   Hedges, Reyes, PA-C  gabapentin  (NEURONTIN ) 300 MG capsule Take 1 capsule (300 mg total) by mouth at bedtime. 11/25/23   Vicci Barnie NOVAK, MD  hydrALAZINE  (APRESOLINE ) 25 MG tablet Take 0.5 tablets (12.5 mg total) by mouth 3 (three) times daily. 10/23/23 01/21/24   Glena Harlene HERO, FNP  insulin  aspart (NOVOLOG  FLEXPEN) 100 UNIT/ML FlexPen Inject 10-20 Units into the skin 3 (three) times daily before meals. 10/13/23   Regalado, Belkys A, MD  insulin  glargine (LANTUS  SOLOSTAR) 100 UNIT/ML Solostar Pen Inject 45 Units into the skin daily. Patient taking differently: Inject 30 Units into the skin at bedtime. 08/13/23   Trixie File, MD  Insulin  Pen Needle (TECHLITE PEN NEEDLES) 32G X 4 MM MISC Use to inject insulin  into the skin 3 times per day. 10/13/23   Regalado, Belkys A, MD  loratadine  (CLARITIN ) 10 MG tablet Take 1 tablet (10 mg total) by mouth daily. 11/16/23   Hedges, Reyes, PA-C  methocarbamol  (ROBAXIN ) 500 MG tablet Take 1 tablet (500 mg total) by mouth daily. 11/25/23   Vicci Barnie NOVAK, MD  metolazone  (ZAROXOLYN ) 5 MG tablet Take 1 tablet (5 mg total) by mouth once a week. 11/25/23 02/23/24  Vicci Barnie NOVAK, MD  metoprolol  succinate (TOPROL -XL) 25 MG 24 hr tablet Take 0.5 tablets (12.5 mg total) by mouth daily. 12/14/23   Vicci Barnie NOVAK, MD  nystatin  (MYCOSTATIN /NYSTOP ) powder Apply 1 Application topically 3 (three) times daily. 11/25/23   Vicci Barnie NOVAK, MD  Polyethyl Glycol-Propyl Glycol (SYSTANE) 0.4-0.3 % SOLN Place 1 drop into both eyes daily as needed (dry eyes).    [provider]  potassium chloride  SA (KLOR-CON  M) 20 MEQ tablet Take 2 tablets (40 mEq total) by mouth daily. 11/04/23   Milford, Harlene HERO, FNP  sertraline  (ZOLOFT ) 50 MG tablet Take 1.5 tablets (75 mg total) by mouth in the morning. 11/19/23 11/18/24  Carvin Arvella HERO, MD  SYMBICORT  160-4.5 MCG/ACT inhaler Inhale 2 puffs into the lungs daily. 08/28/23   [provider]  tamsulosin  (FLOMAX ) 0.4 MG CAPS capsule Take 2 capsules (0.8 mg total) by mouth daily. 11/25/23   Vicci Barnie NOVAK, MD  torsemide  (DEMADEX ) 20 MG tablet Take 2 tablets (40 mg total) by mouth daily. 11/04/23   Glena Harlene HERO, FNP    Physical Exam: Vitals:   12/19/23 1730 12/19/23 1745   BP: 127/86   Pulse: (!) 111   Resp: (!) 24   Temp: 98.8 F (37.1 C)   SpO2: 95%   Weight:  131.2 kg  Height:  6' (1.829 m)   Physical Exam:  General: No acute distress, well developed HEENT: Normocephalic, atraumatic, PERRL Cardiovascular: Normal rate and rhythm. Distal pulses intact. Pulmonary: diminished bs, faint end expiratory high-pitched wheeze Gastrointestinal: Nondistended abdomen, soft, tender in LUQ, hypoactive bowel sounds Musculoskeletal:Normal ROM, no lower ext edema Swelling around left knee? Neuro: No focal deficits noted, AAOx3. PSYCH: Attentive and cooperative  Data Reviewed:  Results for orders placed or performed during the hospital encounter of 12/19/23 (from the past 24 hours)  Basic metabolic panel     Status: Abnormal   Collection Time: 12/19/23  5:56 PM  Result Value Ref Range   Sodium 137 135 - 145 mmol/L   Potassium 3.0 (L) 3.5 - 5.1 mmol/L   Chloride 93 (L) 98 - 111 mmol/L   CO2 27 22 - 32 mmol/L   Glucose, Bld 252 (H) 70 - 99 mg/dL   BUN 38 (H) 6 - 20 mg/dL   Creatinine, Ser 6.72 (H) 0.61 - 1.24 mg/dL   Calcium  9.7 8.9 - 10.3 mg/dL   GFR, Estimated 21 (L) >60 mL/min   Anion gap 17 (H) 5 - 15  CBC     Status: None   Collection Time: 12/19/23  5:56 PM  Result Value Ref Range   WBC 8.4 4.0 - 10.5 K/uL   RBC 4.62 4.22 - 5.81 MIL/uL   Hemoglobin 14.1 13.0 - 17.0 g/dL   HCT 57.4 60.9 - 47.9 %   MCV 92.0 80.0 - 100.0 fL   MCH 30.5 26.0 - 34.0 pg   MCHC 33.2 30.0 - 36.0 g/dL   RDW 84.4 88.4 - 84.4 %   Platelets 329 150 - 400 K/uL   nRBC 0.2 0.0 - 0.2 %  Troponin I (High Sensitivity)     Status: Abnormal   Collection Time: 12/19/23  5:56 PM  Result Value Ref Range   Troponin I (High Sensitivity) 18 (H) <18 ng/L  Brain natriuretic peptide     Status: None   Collection Time: 12/19/23  5:56 PM  Result Value Ref Range   B Natriuretic Peptide 27.0 0.0 - 100.0 pg/mL   *Note: Due to a large number of results and/or encounters for the requested  time period, some results have not been displayed. A complete set of results can be found in Results Review.     Assessment and Plan: SOB likely combined CHF exacerbation and COPD with aspiration - Start Augmentin  - Nebs, steroids - Oxygen  - Consider IV Lasix  dose - Heart failure team consult - Perhaps the patient is a candidate for rehab.  His exercise tolerance really should be better than a couple of feet. - Continue amiodarone , Eliquis , Beta blocker  2. OSA -the patient has not been able to tolerate his new CPAP machine at home.  If he could tolerate CPAP this may help his symptoms. - CPAP nightly  3. Acute on chronic renal failure - his creatinine is up to 3.2 from baseline 2.7, but I suspect he needs diuresis. - Monitor Cr  4. DMT2 -we will decrease his Lantus  to 20 units because creatinine is worsened.  5. Hyperlipidemia - Cont statins  6. Depression - Continue outpatient meds  7. Htn - Continue outpatient meds.    Advance Care Planning:   Code Status: Prior the patient names his brother as a Runner, broadcasting/film/video.  He has an AICD but does not want to be intubated or escalate to chest compressions.  He will be made DNR.  Consults: none  Family Communication: Patient's brother at bedside  Severity of Illness: The appropriate patient status for this patient is INPATIENT. Inpatient status is judged to be reasonable and necessary in order to provide the required intensity of service to ensure the patient's safety. The patient's presenting symptoms, physical exam findings, and initial radiographic and laboratory data in the context of their chronic comorbidities is felt to place them at high risk for further clinical deterioration. Furthermore, it is not anticipated that the patient will be medically stable for discharge from the hospital within 2 midnights of admission.   * I certify that at the point of admission it is my clinical judgment that the patient will require  inpatient hospital care spanning beyond 2 midnights from the point of admission due to high intensity of service, high risk for further deterioration and high frequency of surveillance required.*  Author: ARTHEA CHILD, MD 12/19/2023 7:55 PM  For on call review [email or web address].

## 2023-12-20 ENCOUNTER — Encounter (HOSPITAL_COMMUNITY): Payer: Self-pay | Admitting: Family Medicine

## 2023-12-20 ENCOUNTER — Other Ambulatory Visit: Payer: Self-pay

## 2023-12-20 DIAGNOSIS — J9601 Acute respiratory failure with hypoxia: Secondary | ICD-10-CM | POA: Diagnosis present

## 2023-12-20 DIAGNOSIS — J441 Chronic obstructive pulmonary disease with (acute) exacerbation: Secondary | ICD-10-CM | POA: Diagnosis not present

## 2023-12-20 LAB — GLUCOSE, CAPILLARY
Glucose-Capillary: 370 mg/dL — ABNORMAL HIGH (ref 70–99)
Glucose-Capillary: 288 mg/dL — ABNORMAL HIGH (ref 70–99)

## 2023-12-20 LAB — CBG MONITORING, ED
Glucose-Capillary: 565 mg/dL (ref 70–99)
Glucose-Capillary: 464 mg/dL — ABNORMAL HIGH (ref 70–99)

## 2023-12-20 MED ORDER — INSULIN GLARGINE-YFGN 100 UNIT/ML ~~LOC~~ SOLN
40.0000 [IU] | Freq: Every day | SUBCUTANEOUS | Status: DC
  Administered 2023-12-20 – 2023-12-21 (×2): 40 [IU] via SUBCUTANEOUS
  Filled 2023-12-20 (×2): qty 0.4

## 2023-12-20 MED ORDER — AMOXICILLIN-POT CLAVULANATE 875-125 MG PO TABS
1.0000 | ORAL_TABLET | Freq: Two times a day (BID) | ORAL | Status: DC
  Administered 2023-12-20 – 2023-12-23 (×6): 1 via ORAL
  Filled 2023-12-20 (×7): qty 1

## 2023-12-20 MED ORDER — FUROSEMIDE 20 MG PO TABS
80.0000 mg | ORAL_TABLET | Freq: Once | ORAL | Status: AC
  Administered 2023-12-20: 80 mg via ORAL
  Filled 2023-12-20: qty 4

## 2023-12-20 MED ORDER — FUROSEMIDE 10 MG/ML IJ SOLN
40.0000 mg | Freq: Every day | INTRAMUSCULAR | Status: DC
  Administered 2023-12-20 – 2023-12-23 (×4): 40 mg via INTRAVENOUS
  Filled 2023-12-20 (×4): qty 4

## 2023-12-20 MED ORDER — INSULIN ASPART 100 UNIT/ML IJ SOLN
0.0000 [IU] | Freq: Every day | INTRAMUSCULAR | Status: DC
  Administered 2023-12-20 – 2023-12-23 (×4): 3 [IU] via SUBCUTANEOUS
  Administered 2023-12-24 – 2023-12-26 (×3): 2 [IU] via SUBCUTANEOUS
  Administered 2023-12-27: 3 [IU] via SUBCUTANEOUS
  Administered 2023-12-28 – 2023-12-30 (×2): 2 [IU] via SUBCUTANEOUS
  Administered 2023-12-31 – 2024-01-01 (×2): 3 [IU] via SUBCUTANEOUS

## 2023-12-20 MED ORDER — INSULIN ASPART 100 UNIT/ML IJ SOLN
0.0000 [IU] | Freq: Three times a day (TID) | INTRAMUSCULAR | Status: DC
  Administered 2023-12-20 (×3): 20 [IU] via SUBCUTANEOUS
  Administered 2023-12-21: 11 [IU] via SUBCUTANEOUS
  Administered 2023-12-21: 7 [IU] via SUBCUTANEOUS
  Administered 2023-12-21: 11 [IU] via SUBCUTANEOUS
  Administered 2023-12-22: 7 [IU] via SUBCUTANEOUS
  Administered 2023-12-22: 20 [IU] via SUBCUTANEOUS
  Administered 2023-12-22: 15 [IU] via SUBCUTANEOUS
  Administered 2023-12-23: 20 [IU] via SUBCUTANEOUS
  Administered 2023-12-23: 11 [IU] via SUBCUTANEOUS
  Administered 2023-12-23 – 2023-12-24 (×3): 4 [IU] via SUBCUTANEOUS
  Administered 2023-12-25 (×2): 7 [IU] via SUBCUTANEOUS
  Administered 2023-12-26 – 2023-12-27 (×3): 4 [IU] via SUBCUTANEOUS
  Administered 2023-12-27: 7 [IU] via SUBCUTANEOUS
  Administered 2023-12-27: 4 [IU] via SUBCUTANEOUS
  Administered 2023-12-28: 7 [IU] via SUBCUTANEOUS
  Administered 2023-12-28: 3 [IU] via SUBCUTANEOUS
  Administered 2023-12-28 – 2023-12-29 (×3): 11 [IU] via SUBCUTANEOUS
  Administered 2023-12-29: 15 [IU] via SUBCUTANEOUS
  Administered 2023-12-30: 11 [IU] via SUBCUTANEOUS
  Administered 2023-12-30: 7 [IU] via SUBCUTANEOUS
  Administered 2023-12-30: 11 [IU] via SUBCUTANEOUS
  Administered 2023-12-31: 3 [IU] via SUBCUTANEOUS
  Administered 2023-12-31: 7 [IU] via SUBCUTANEOUS
  Administered 2023-12-31: 11 [IU] via SUBCUTANEOUS
  Administered 2024-01-01: 7 [IU] via SUBCUTANEOUS
  Administered 2024-01-02: 11 [IU] via SUBCUTANEOUS
  Administered 2024-01-04: 4 [IU] via SUBCUTANEOUS
  Administered 2024-01-04: 7 [IU] via SUBCUTANEOUS
  Administered 2024-01-05: 4 [IU] via SUBCUTANEOUS

## 2023-12-20 MED ORDER — INSULIN ASPART 100 UNIT/ML IJ SOLN
20.0000 [IU] | Freq: Once | INTRAMUSCULAR | Status: AC
  Administered 2023-12-20: 20 [IU] via SUBCUTANEOUS

## 2023-12-20 NOTE — ED Notes (Signed)
 Pt refused am labs.

## 2023-12-20 NOTE — ED Notes (Signed)
 Called CCMD and placed patient on the monitor.

## 2023-12-20 NOTE — ED Notes (Signed)
 Iv team unable to get USIV. MD Howeter made aware. New orders placed.

## 2023-12-20 NOTE — ED Notes (Addendum)
 Patient stuck multiple times for IV attempt with no success. IV team consult placed, IV team stated multiple patients ahead of them, they will come when they get a chance.

## 2023-12-20 NOTE — ED Notes (Signed)
 Pt ambulated to the bathroom.

## 2023-12-20 NOTE — ED Notes (Signed)
 Cardiac monitoring not picking up leads. Have readjusted multiple times. Pt refuses shaving at this time

## 2023-12-20 NOTE — Progress Notes (Signed)
 After 2 IV sticks with u/s, neither of which would thread, pt refuses any further attempt.

## 2023-12-20 NOTE — Progress Notes (Signed)
   12/20/23 2013  BiPAP/CPAP/SIPAP  Reason BIPAP/CPAP not in use Non-compliant (patient has his home machine at bedside but refuses CPAP for HS)

## 2023-12-20 NOTE — Evaluation (Signed)
 Occupational Therapy Evaluation Patient Details Name: [name] MRN: [id number] DOB: 1966-03-17 Today's Date: 12/20/2023   History of Present Illness   58 yo male presents with CP and worsening SOB. Pt reports choking and concerns for aspiration.  CBG 565 PMH DM2, nonischemic cardiomyopathy CHF ICD CKDIIIB obesity, gout, afib on anticoagulation,     Clinical Impressions Patient evaluated by Occupational Therapy with no further acute OT needs identified. All education has been completed and the patient has no further questions. See below for any follow-up Occupational Therapy or equipment needs. OT to sign off. Thank you for referral.       If plan is discharge home, recommend the following:         Functional Status Assessment   Patient has had a recent decline in their functional status and demonstrates the ability to make significant improvements in function in a reasonable and predictable amount of time.     Equipment Recommendations   None recommended by OT     Recommendations for Other Services         Precautions/Restrictions   Precautions Precautions: None Recall of Precautions/Restrictions: Intact Restrictions Weight Bearing Restrictions Per Provider Order: No     Mobility Bed Mobility Overal bed mobility: Modified Independent                  Transfers Overall transfer level: Modified independent                        Balance Overall balance assessment: Modified Independent (pt states this is better than i was doing before i got here)                                         ADL either performed or assessed with clinical judgement   ADL Overall ADL's : Modified independent                                       General ADL Comments: don socks during session. completed transfer, bed mobility     Vision Baseline Vision/History: 1 Wears glasses Patient Visual Report: No change from  baseline       Perception Perception: Within Functional Limits       Praxis Praxis: WFL       Pertinent Vitals/Pain Pain Assessment Pain Assessment: No/denies pain     Extremity/Trunk Assessment Upper Extremity Assessment Upper Extremity Assessment: Overall WFL for tasks assessed   Lower Extremity Assessment Lower Extremity Assessment: Overall WFL for tasks assessed   Cervical / Trunk Assessment Cervical / Trunk Assessment: Normal   Communication     Cognition Arousal: Alert Behavior During Therapy: WFL for tasks assessed/performed Cognition: No apparent impairments                               Following commands: Intact       Cueing  General Comments      VSS on RA 92%. pt returned to 2L [place] and RN informed sustained on RA > 90 while awake   Exercises     Shoulder Instructions      Home Living Family/patient expects to be discharged to:: Private residence Living Arrangements: Other relatives;Other (Comment) Available Help at Discharge:  Family;Available PRN/intermittently Type of Home: House Home Access: Stairs to enter [employer] of Steps: 3 Entrance Stairs-Rails: Right Home Layout: One level     Bathroom Shower/Tub: Chief Strategy Officer: Handicapped height Bathroom Accessibility: No   Home Equipment: Rollator (4 wheels);Cane - single point;BSC/3in1   Additional Comments: no pets      Prior Functioning/Environment Prior Level of Function : Independent/Modified Independent;Driving                    OT Problem List:     OT Treatment/Interventions:        OT Goals(Current goals can be found in the care plan section)   Acute Rehab OT Goals Patient Stated Goal: to get my sugar lower Potential to Achieve Goals: Good   OT Frequency:       Co-evaluation              AM-PAC OT 6 Clicks Daily Activity     Outcome Measure Help from another person eating meals?: None Help from  another person taking care of personal grooming?: None Help from another person toileting, which includes using toliet, bedpan, or urinal?: None Help from another person bathing (including washing, rinsing, drying)?: None Help from another person to put on and taking off regular upper body clothing?: None Help from another person to put on and taking off regular lower body clothing?: None 6 Click Score: 24   End of Session Equipment Utilized During Treatment: Gait belt Nurse Communication: Mobility status  Activity Tolerance: Patient tolerated treatment well Patient left: in bed;with call bell/phone within reach  OT Visit Diagnosis: Unsteadiness on feet (R26.81)                Time: 9184-9143 OT Time Calculation (min): 41 min Charges:  OT General Charges $OT Visit: 1 Visit OT Evaluation $OT Eval Moderate Complexity: 1 Mod OT Treatments $Self Care/Home Management : 8-22 mins   Brynn, OTR/L  Acute Rehabilitation Services Office: [phone] .   Ely Molt 12/20/2023, 10:52 AM

## 2023-12-20 NOTE — Progress Notes (Signed)
 PT Cancellation Note  Patient Details Name: [name] MRN: [id number] DOB: January 08, 1966   Cancelled Treatment:    Reason Eval/Treat Not Completed: OT screened, no needs identified, will sign off (OT saw pt earlier in the morning. States pt is at baseline. Will sign off at this time. Please re-consult if further needs arise.)  Dorothyann Maier, DPT, CLT  Acute Rehabilitation Services Office: [phone] (Secure chat preferred)   Dorothyann VEAR Maier 12/20/2023, 9:14 AM

## 2023-12-20 NOTE — Progress Notes (Signed)
 [facility] PROGRESS NOTE  [name] (DOB: 06/14/1965) FMW:[id number] PCP: Vicci Barnie NOVAK, MD  Brief Narrative: [name] is a 58 y.o. male with a history of cardiac arrest status post AICD placement, NICM, atrial fibrillation on anticoagulation, T2DM, stage IIIb CKD  who presented to the ED on 12/19/2023 with worsening shortness of breath over the last couple of days. Approximately 7 days ago the patient was drinking some liquid and he got choked.  He thinks he aspirated.  The next morning he started coughing up a lot of phlegm.  It is a thick yellow phlegm which is green continue to get worse over the last week with wheezing, also noting weight gain (280lbs > 290lbs). Work up in the ED included nonacute CXR, though he required admission for AECOPD and possible acute CHF.   Subjective: Breathing slightly improved but no where near his baseline. No current chest pain though this waxes and wanes chronically.   Objective: BP (!) 140/88   Pulse 86   Temp 98 F (36.7 C) (Oral)   Resp (!) 23   Ht 6' (1.829 m)   Wt 131.2 kg   SpO2 96%   BMI 39.23 kg/m   Gen: No distress, obese Pulm: No wheezes or crackles, diminished and nonlabored  CV: RRR, no MRG, trace LE edema predominantly pedal.  GI: Soft, NT, ND, +BS  Neuro: Alert and oriented. No new focal deficits. Ext: Warm, no deformities. Skin: No acute rashes, lesions or ulcers on visualized skin   Assessment & Plan: Principal Problem:   COPD with acute exacerbation (HCC) Active Problems:   Stage 3b chronic kidney disease (HCC)   Type 2 diabetes mellitus with hyperlipidemia (HCC)   Diabetic polyneuropathy associated with type 2 diabetes mellitus (HCC)   Acute renal failure (ARF) (HCC)   Acute exacerbation of CHF (congestive heart failure) (HCC)   Acute respiratory failure with hypoxia (HCC)  Acute shortness of breath SOB likely combined CHF exacerbation and COPD with aspiration: BNP stable at baseline/normal (albeit  BMI is 39), CXR very reassuring.  - Need to check d-dimer given ongoing issue and negative CXR.  - Agree with empiric augmentin  given history of dyspnea and cough since choking episode.  - Continue nebs.  - Given severity of steroid-induced hyperglycemia, will hold continued steroids given cessation of wheezing.  - Continue supplemental oxygen  to maintain normal WOB and SpO2 >89%.  - Await BMP this AM. Given IV lasix  overnight.  - PT/OT consulted, may benefit from cardiac rehab after discharge.   Very poor venous access:  - Discussed risk of PICC placement which was ordered at admission. Pt opts to try sticking LUE despite history of DVT (Dx November 2024, has been adherent to eliquis  which we are continuing, following with with Dr. Loretha who has U/S last month showing only SVT, no DVT)  CHF, AFib, HTN:  - Continue HF team outpatient follow up. Give IV lasix  now.  - Continue amiodarone , metoprolol , eliquis    OSA: intolerant even of new CPAP machine at home  - CPAP nightly   AKI on stage IIIb CKD: SCr on admission is 3.2 from baseline 2.7.  - Repeat is pending, still awaiting this. Very difficult to get IV access and phlebotomy.  - May pursue diuresis - Avoid nephrotoxins.    IDT2DM with steroid induced hyperglycemia:  - Augment insulin  to glargine 40u daily (has been taking 28u nightly) and resistant SSI (has been taking novolog  34u BID). Suspect this will improve CBGs today,  do not want to overcorrect in setting of holding further steroids.  - Continue regular follow up with endocrinology, Dr. Trixie.    HLD:  - Continue statin  Depression:  - Continue SSRI  BPH:  - Continue tamsulosin   [name], MD [facility] [email or web address] 12/20/2023, 9:48 AM

## 2023-12-20 NOTE — ED Notes (Signed)
 Breakfast order placed per patient request: cheese omelet, [place] sausage, potatoes.

## 2023-12-20 NOTE — ED Notes (Signed)
 Attempted PIV X 2 in LH and LAC without success. IV team order placed. MD made aware.

## 2023-12-20 NOTE — ED Notes (Signed)
 Secure chat sent to MD regarding CBG 565. Secretary notified to page also.

## 2023-12-20 NOTE — Progress Notes (Signed)
 [facility] night cross cover note:   I was notified by the patient's RN that we have been unable to establish peripheral IV access for this patient, in spite of consult to the IV team. In total , 9 attempts have been made at establishing IV access, all of which have been unsuccessful. Patient prefers no additional iv sticks for right now.   Hemodynamically, the patient appears stable, with systolic blood pressures in the 120s mmHg and heart rates in the 90s.  He is afebrile.  RN conveys that the patient was due for a dose of Lasix  40 mg IV, although this could not be administered given the absence of current IV access. it is noted that his current Lasix  order is 40 mg IV q daily.   At this time, I ordered a one-time dose of Lasix  80 mg p.o. x 1 dose now and re-timed the order for iv lasix  to start later today, after we have been able to establish iv access.   I've placed orders for PICC line placement to occur on day shift.      Eva Pore, DO Hospitalist

## 2023-12-21 DIAGNOSIS — J441 Chronic obstructive pulmonary disease with (acute) exacerbation: Secondary | ICD-10-CM | POA: Diagnosis not present

## 2023-12-21 DIAGNOSIS — N179 Acute kidney failure, unspecified: Secondary | ICD-10-CM | POA: Diagnosis not present

## 2023-12-21 DIAGNOSIS — I5043 Acute on chronic combined systolic (congestive) and diastolic (congestive) heart failure: Secondary | ICD-10-CM | POA: Diagnosis not present

## 2023-12-21 DIAGNOSIS — I472 Ventricular tachycardia, unspecified: Secondary | ICD-10-CM | POA: Diagnosis not present

## 2023-12-21 LAB — BASIC METABOLIC PANEL WITH GFR
Anion gap: 15 (ref 5–15)
BUN: 62 mg/dL — ABNORMAL HIGH (ref 6–20)
CO2: 29 mmol/L (ref 22–32)
Calcium: 9.6 mg/dL (ref 8.9–10.3)
Chloride: 90 mmol/L — ABNORMAL LOW (ref 98–111)
Creatinine, Ser: 3 mg/dL — ABNORMAL HIGH (ref 0.61–1.24)
GFR, Estimated: 23 mL/min — ABNORMAL LOW (ref >60)
Glucose, Bld: 258 mg/dL — ABNORMAL HIGH (ref 70–99)
Potassium: 3.5 mmol/L (ref 3.5–5.1)
Sodium: 134 mmol/L — ABNORMAL LOW (ref 135–145)

## 2023-12-21 LAB — GLUCOSE, CAPILLARY
Glucose-Capillary: 218 mg/dL — ABNORMAL HIGH (ref 70–99)
Glucose-Capillary: 257 mg/dL — ABNORMAL HIGH (ref 70–99)
Glucose-Capillary: 289 mg/dL — ABNORMAL HIGH (ref 70–99)
Glucose-Capillary: 296 mg/dL — ABNORMAL HIGH (ref 70–99)

## 2023-12-21 LAB — MAGNESIUM: Magnesium: 2 mg/dL (ref 1.7–2.4)

## 2023-12-21 LAB — D-DIMER, QUANTITATIVE: D-Dimer, Quant: <0.27 ug{FEU}/mL (ref 0.00–0.50)

## 2023-12-21 MED ORDER — INSULIN GLARGINE-YFGN 100 UNIT/ML ~~LOC~~ SOLN
10.0000 [IU] | Freq: Once | SUBCUTANEOUS | Status: AC
  Administered 2023-12-21: 10 [IU] via SUBCUTANEOUS
  Filled 2023-12-21: qty 0.1

## 2023-12-21 MED ORDER — INSULIN ASPART 100 UNIT/ML IJ SOLN
10.0000 [IU] | Freq: Three times a day (TID) | INTRAMUSCULAR | Status: DC
  Administered 2023-12-21 – 2023-12-23 (×6): 10 [IU] via SUBCUTANEOUS

## 2023-12-21 MED ORDER — IPRATROPIUM-ALBUTEROL 0.5-2.5 (3) MG/3ML IN SOLN
3.0000 mL | Freq: Two times a day (BID) | RESPIRATORY_TRACT | Status: DC
  Administered 2023-12-21 – 2023-12-24 (×6): 3 mL via RESPIRATORY_TRACT
  Filled 2023-12-21 (×6): qty 3

## 2023-12-21 MED ORDER — PREDNISONE 20 MG PO TABS
40.0000 mg | ORAL_TABLET | Freq: Every day | ORAL | Status: DC
  Administered 2023-12-21 – 2023-12-23 (×3): 40 mg via ORAL
  Filled 2023-12-21 (×3): qty 2

## 2023-12-21 MED ORDER — INSULIN GLARGINE-YFGN 100 UNIT/ML ~~LOC~~ SOLN
50.0000 [IU] | Freq: Every day | SUBCUTANEOUS | Status: DC
  Filled 2023-12-21: qty 0.5

## 2023-12-21 NOTE — TOC Initial Note (Signed)
 Transition of Care [facility]) - Initial/Assessment Note    Patient Details  Name: [name] MRN: [id number] Date of Birth: December 09, 1965  Transition [facility] CM/SW Contact:    Waddell Barnie Rama, RN Phone Number: 12/21/2023, 4:10 PM  Clinical Narrative:                 From home with brother, has PCP and insurance on file, states has no HH services in place at this time , has cane, and a walker at home.  States he will drive him self home at [employer] and family is support system, states gets medications from [place] on [address].  Pta self ambulatory.   Expected Discharge Plan: Home/Self Care Barriers to Discharge: Continued Medical Work up   Patient Goals and CMS Choice Patient states their goals for this hospitalization and ongoing recovery are:: return home   Choice offered to / list presented to : NA      Expected Discharge Plan and Services In-house Referral: NA Discharge Planning Services: CM Consult Post Acute Care Choice: NA Living arrangements for the past 2 months: Single Family Home                 DME Arranged: N/A DME Agency: NA       HH Arranged: NA          Prior Living Arrangements/Services Living arrangements for the past 2 months: Single Family Home Lives with:: Siblings (brother) Patient language and need for interpreter reviewed:: Yes Do you feel safe going back to the place where you live?: Yes      Need for Family Participation in Patient Care: Yes (Comment) Care giver support system in place?: Yes (comment) Current home services: DME (walker/cane) Criminal Activity/Legal Involvement Pertinent to Current Situation/Hospitalization: No - Comment as needed  Activities of Daily Living   ADL Screening (condition at time of admission) Independently performs ADLs?: Yes (appropriate for developmental age) Is the patient deaf or have difficulty hearing?: No Does the patient have difficulty seeing, even when wearing glasses/contacts?:  No Does the patient have difficulty concentrating, remembering, or making decisions?: No  Permission Sought/Granted Permission sought to share information with : Case Manager, Family Supports Permission granted to share information with : Yes, Verbal Permission Granted  Share Information with NAME: brother           Emotional Assessment Appearance:: Appears stated age Attitude/Demeanor/Rapport: Engaged Affect (typically observed): Appropriate Orientation: : Oriented to Self, Oriented to Place, Oriented to  Time, Oriented to Situation   Psych Involvement: No (comment)  Admission diagnosis:  Acute exacerbation of CHF (congestive heart failure) (HCC) [I50.9] Acute respiratory failure with hypoxia (HCC) [J96.01] AKI (acute kidney injury) (HCC) [N17.9] Chronic obstructive pulmonary disease with acute exacerbation (HCC) [J44.1] Patient Active Problem List   Diagnosis Date Noted   Acute respiratory failure with hypoxia (HCC) 12/20/2023   COPD with acute exacerbation (HCC) 12/19/2023   Acute exacerbation of CHF (congestive heart failure) (HCC) 12/19/2023   ARF (acute renal failure) (HCC) 10/08/2023   Acute gout 10/08/2023   Anemia 10/08/2023   Acute renal failure (ARF) (HCC) 10/08/2023   Hypotension 09/07/2023   Acute deep vein thrombosis (DVT) of left upper extremity (HCC) 04/24/2023   Cellulitis of perineum 04/02/2023   Heart failure (HCC) 02/28/2023   Class 3 obesity 02/28/2023   SOB (shortness of breath) 02/27/2023   Loud snoring 10/28/2022   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   Hyperlipidemia associated with type 2 diabetes mellitus (  HCC) 09/18/2022   PTSD (post-traumatic stress disorder) 07/10/2022   Panic attack 06/17/2022   Passive suicidal ideations 06/17/2022   History of ventricular tachycardia 06/14/2022   Essential hypertension 06/14/2022   Stage 3b chronic kidney disease (HCC) 05/15/2022   Acute gout due to renal impairment involving left ankle  05/07/2022   Acute gout due to renal impairment involving right knee 05/07/2022   Hyponatremia 05/07/2022   Diarrhea 05/06/2022   Hyperglycemia 05/06/2022   Cellulitis of buttock 05/06/2022   Acute renal failure superimposed on stage 3b chronic kidney disease (HCC) 05/06/2022   Hypokalemia 05/05/2022   Rectal abscess 05/05/2022   Left buttock abscess 05/05/2022   Cardiac arrest (HCC) 05/03/2022   AKI (acute kidney injury) (HCC) 05/03/2022   VT (ventricular tachycardia) (HCC) 05/03/2022   CKD (chronic kidney disease) stage 2, GFR 60-89 ml/min 10/24/2019   Acute non-recurrent frontal sinusitis 06/27/2019   COVID-19 virus infection 06/27/2019   Glaucoma suspect 12/22/2018   Seasonal and perennial allergic rhinoconjunctivitis 10/25/2018   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   Urticaria 05/03/2018   S/P inguinal hernia repair 03/01/2018   Unilateral inguinal hernia without obstruction or gangrene 07/24/2017   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   Hypersomnia 02/28/2016   Abnormal PFT 02/24/2016   Dyspnea 01/28/2016   Diabetic polyneuropathy associated with type 2 diabetes mellitus (HCC) 12/18/2015   Lumbar back pain 12/18/2015   Type 2 diabetes mellitus with hyperlipidemia (HCC) 09/17/2015   Left median nerve neuropathy 04/16/2015   Lesion of right median nerve at forearm 04/16/2015   ICD (implantable cardioverter-defibrillator) in place 03/28/2014   Chronic systolic congestive heart failure (HCC) 03/28/2014   Nonischemic cardiomyopathy (HCC) 11/09/2013   Syncope 10/20/2013   Chest pain 07/28/2013   RENAL CALCULUS 05/13/2009   Obesity 05/08/2009   Former heavy cigarette smoker (20-39 per day) 05/08/2009   HOARSENESS 05/08/2009   GAD (generalized anxiety disorder) 02/28/2008   ASTHMATIC BRONCHITIS, ACUTE 02/28/2008   GERD 02/28/2008   IRRITABLE BOWEL SYNDROME 02/28/2008   PCP:  Vicci Barnie NOVAK, MD Pharmacy:   [facility]  [address], [place] - [address] [place] [address] Phone: [phone] Fax: [phone]     Social Drivers of Health (SDOH) Social History: SDOH Screenings   Food Insecurity: No Food Insecurity (12/20/2023)  Housing: Low Risk  (12/20/2023)  Transportation Needs: No Transportation Needs (12/20/2023)  Utilities: Not At Risk (12/20/2023)  Alcohol Screen: Low Risk  (05/25/2023)  Depression (PHQ2-9): Low Risk  (08/07/2023)  Recent Concern: Depression (PHQ2-9) - Medium Risk (05/25/2023)  Financial Resource Strain: Low Risk  (05/25/2023)  Recent Concern: Financial Resource Strain - High Risk (03/24/2023)  Physical Activity: Inactive (05/25/2023)  Social Connections: Moderately Integrated (10/08/2023)  Stress: Stress Concern Present (05/25/2023)  Tobacco Use: Medium Risk (12/20/2023)  Health Literacy: Adequate Health Literacy (05/25/2023)   SDOH Interventions:     Readmission Risk Interventions    12/21/2023    4:08 PM  Readmission Risk Prevention Plan  Transportation Screening Complete  Medication Review (RN Care Manager) Complete  PCP or Specialist appointment within 3-5 days of discharge Complete  HRI or Home Care Consult Complete  Palliative Care Screening Not Applicable  Skilled Nursing Facility Not Applicable

## 2023-12-21 NOTE — Consult Note (Addendum)
 Cardiology Consultation   Patient ID: [name] MRN: [id number]; DOB: 1966/02/08  Admit date: 12/19/2023 Date of Consult: 12/21/2023  PCP:  [name] Barnie NOVAK, MD   [facility] Providers Cardiologist:  [name] Shallow, MD  Electrophysiologist:  [name] ONEIDA HOLTS, MD     Patient Profile: [name] is [name] 58 y.o. male with [name] hx of HFrEF s/p Medtronic ICD, nonischemic cardiomyopathy, type 2 diabetes, CKD stage IIIb, prior DVT, Prior VT, and OSA who is being seen 12/21/2023 for the evaluation of CHF at the request of [name] Motto MD.  History of Present Illness: [name] is [name] 58 year old male with prior cardiac history listed below.  Was initially diagnosed with HFrEF in 2015.  Had [name] sharp decline in functional ability after being diagnosed with long COVID.  On 04/2022 was admitted for VT that was specter to be secondary to electrolyte abnormalities and infection from [name] gluteal abscess.  At this time underwent [name] left and right heart cath that showed normal coronary arteries, normal cardiac output, and normal filling pressures.  TTE showed reduced LVEF of 30 to 35%. Subsequently [name] lead was placed in the right atrium and improved ectopy.  On 03/2023 was admitted for groin cellulitis that required IV antibiotic.  At this time Farxiga  was stopped.  Was admitted on 11/24 and was found to have [name] left upper extremity DVT.  Was started on Eliquis  at this time.  Most recent echo on 08/2023 showed reduced LVEF of 40 to 45%, global LV hypokinesis, moderate concentric LVH, G1 DD, aortic valve calcification, and borderline aortic root dilation measuring 39 mm.  Was last seen at the advanced heart failure clinic on 10/2023.  At that follow-up reportedly had dyspnea on exertion, orthopnea, and an atypical chest pain.  Patient presented to the emergency department on 12/19/2023 for chest pain, lower extremity edema, and shortness of breath.  Reportedly aspirated on some food several days  prior and has had some coughing.  On interview patient reported having [name] left-sided chest pain that started last Saturday.  The pain comes and goes and does not last very long.  Is unable to identify anything that makes the chest pain better or worse.  Stated that he always has chest pain with his CHF.  Denies any current chest pain. he also reported having lower extremity edema, dyspnea on exertion, shortness of breath, and chronic orthopnea.  The patient feels like his lower extremity edema and shortness of breath have improved since being hospitalized.  Denies tobacco use, alcohol use, and illicit substance use  Labs showed Normal BNP of 27.  Borderline elevated high-sensitivity troponins 18> 17.  D-dimer negative.  Has had elevated blood glucose this hospitalization as high as 565.  EKG showed sinus tachycardia with [name] rate of 104, right axis deviation, and first-degree AV block. Chest x-ray showed: No active disease, and Cardiomegaly.   Past Medical History:  Diagnosis Date   AICD (automatic cardioverter/defibrillator) present    Chronic bronchitis (HCC)    get it most q yr (12/23/2013)   Chronic systolic (congestive) heart failure (HCC)    Fracture of left humerus    [name]. 07/2013.   GERD (gastroesophageal reflux disease)    not at present time   Heart murmur    born w/it    History of renal calculi    Hypertension    Kidney stones    NICM (nonischemic cardiomyopathy) (HCC)    [name]. 07/2013 Echo: EF 20-25%, diff HK, Gr2 DD,  mild MR, mod dil LA/RA. 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    Shockable heart rhythm detected by automated external defibrillator    Syncope    Type II diabetes mellitus (HCC) 2006    Past Surgical History:  Procedure Laterality Date   CARDIAC CATHETERIZATION  08/10/13   CHOLECYSTECTOMY  ~ 2010   IMPLANTABLE CARDIOVERTER DEFIBRILLATOR IMPLANT  12/23/2013   STJ Fortify ICD implanted  by Dr [name] for cardiomyopathy and syncope   IMPLANTABLE CARDIOVERTER DEFIBRILLATOR IMPLANT N/[name] 12/23/2013   Procedure: IMPLANTABLE CARDIOVERTER DEFIBRILLATOR IMPLANT;  Surgeon: [name] JONETTA Kelsie, MD;  Location: [facility] CATH LAB;  Service: Cardiovascular;  Laterality: N/[name];   INGUINAL HERNIA REPAIR Left 03/01/2018   Procedure: OPEN REPAIR OF LEFT INGUINAL HERNIA WITH MESH;  Surgeon: [name] Locus, MD;  Location: [facility] ORS;  Service: General;  Laterality: Left;   LEAD REVISION/REPAIR N/[name] 05/15/2022   Procedure: LEAD REVISION/REPAIR;  Surgeon: [name] [name] DASEN, MD;  Location: [facility] INVASIVE CV LAB;  Service: Cardiovascular;  Laterality: N/[name];   LEFT HEART CATHETERIZATION WITH CORONARY ANGIOGRAM N/[name] 08/10/2013   Procedure: LEFT HEART CATHETERIZATION WITH CORONARY ANGIOGRAM;  Surgeon: [name] GORMAN Reek, MD;  Location: [facility] CATH LAB;  Service: Cardiovascular;  Laterality: N/[name];   ORIF HUMERUS FRACTURE Left 08/12/2013   Procedure: OPEN REDUCTION INTERNAL FIXATION (ORIF) HUMERAL SHAFT FRACTURE;  Surgeon: [name] LULLA Sage, MD;  Location: [facility] OR;  Service: Orthopedics;  Laterality: Left;  Open Reduction Internal Fixation Left Humerus   RIGHT/LEFT HEART CATH AND CORONARY ANGIOGRAPHY N/[name] 05/12/2022   Procedure: RIGHT/LEFT HEART CATH AND CORONARY ANGIOGRAPHY;  Surgeon: [name] Led, DO;  Location: [facility] INVASIVE CV LAB;  Service: Cardiovascular;  Laterality: N/[name];   URETEROSCOPY     laser for kidney stones     Home Medications:  Prior to Admission medications   Medication Sig Start Date End Date Taking? Authorizing Provider  acetaminophen  (TYLENOL ) 500 MG tablet Take 1,500 mg by mouth every 8 (eight) hours as needed for moderate pain (pain score 4-6).   Yes [provider]  amiodarone  (PACERONE ) 200 MG tablet Take 2 tablets (400 mg total) by mouth daily. 10/13/23  Yes Regalado, [name] A, MD  apixaban  (ELIQUIS ) 5 MG TABS tablet Take 1 tablet (5 mg total) by mouth 2 (two) times daily. 11/26/23  Yes Iruku, Praveena, MD   atorvastatin  (LIPITOR) 10 MG tablet Take 1 tablet (10 mg total) by mouth daily. 12/14/23  Yes [name] Barnie NOVAK, MD  benzonatate  (TESSALON ) 200 MG capsule Take 200 mg by mouth 2 (two) times daily as needed for cough. 11/01/23  Yes [provider]  cetirizine  (EQ ALLERGY RELIEF, CETIRIZINE ,) 10 MG tablet Take 1 tablet (10 mg total) by mouth daily. 09/30/23  Yes [name] Barnie NOVAK, MD  cholecalciferol  (VITAMIN D3) 25 MCG (1000 UNIT) tablet Take 1,000 Units by mouth in the morning.   Yes [provider]  clonazePAM  (KLONOPIN ) 0.5 MG tablet Take 1 tablet (0.5 mg total) by mouth 2 (two) times daily as needed for anxiety. Patient taking differently: Take 0.5 mg by mouth 2 (two) times daily. 10/30/23 10/29/24 Yes Carvin Arvella HERO, MD  Clotrimazole  1 % OINT Apply to the affected area twice [name] day Patient taking differently: Apply 1 Application topically 2 (two) times daily as needed. Apply to the affected area twice [name] day 09/09/23  Yes Newlin, Corrina, MD  colchicine  0.6 MG tablet 1/2 tab PO Q Mon/Wed/Fri Patient taking differently:  Take 0.5 tablets by mouth See admin instructions. 1/2 tab PO Q Mon/Wed/Fri 12/15/23  Yes [name] Barnie NOVAK, MD  EPINEPHrine  0.3 mg/0.3 mL IJ SOAJ injection Inject 0.3 mg into the muscle as needed. Patient taking differently: Inject 0.3 mg into the muscle as needed for anaphylaxis. 01/30/22  Yes Iva Marty Saltness, MD  famotidine  (PEPCID ) 20 MG tablet TAKE 1 TABLET BY MOUTH 1 TO 2 TIMES DAILY AS NEEDED 10/16/23  Yes Ambs, Arlean HERO, FNP  fluticasone  (FLONASE ) 50 MCG/ACT nasal spray Place 2 sprays into both nostrils daily. 11/16/23  Yes Hedges, Reyes, PA-C  gabapentin  (NEURONTIN ) 300 MG capsule Take 1 capsule (300 mg total) by mouth at bedtime. 11/25/23  Yes [name] Barnie NOVAK, MD  insulin  aspart (NOVOLOG  FLEXPEN) 100 UNIT/ML FlexPen Inject 10-20 Units into the skin 3 (three) times daily before meals. Patient taking differently: Inject 34 Units into the skin 3 (three) times  daily before meals. 10/13/23  Yes Regalado, [name] A, MD  insulin  glargine (LANTUS  SOLOSTAR) 100 UNIT/ML Solostar Pen Inject 45 Units into the skin daily. Patient taking differently: Inject 45 Units into the skin at bedtime. 08/13/23  Yes Trixie File, MD  loratadine  (CLARITIN ) 10 MG tablet Take 1 tablet (10 mg total) by mouth daily. 11/16/23  Yes Hedges, Reyes, PA-C  methocarbamol  (ROBAXIN ) 500 MG tablet Take 1 tablet (500 mg total) by mouth daily. 11/25/23  Yes [name] Barnie NOVAK, MD  metolazone  (ZAROXOLYN ) 5 MG tablet Take 1 tablet (5 mg total) by mouth once [name] week. Patient taking differently: Take 5 mg by mouth once [name] week. mondays 11/25/23 02/23/24 Yes [name] Barnie NOVAK, MD  metoprolol  succinate (TOPROL -XL) 25 MG 24 hr tablet Take 0.5 tablets (12.5 mg total) by mouth daily. 12/14/23  Yes [name] Barnie NOVAK, MD  nystatin  (MYCOSTATIN /NYSTOP ) powder Apply 1 Application topically 3 (three) times daily. 11/25/23  Yes [name] Barnie NOVAK, MD  Polyethyl Glycol-Propyl Glycol (SYSTANE) 0.4-0.3 % SOLN Place 1 drop into both eyes daily as needed (dry eyes).   Yes [provider]  potassium chloride  SA (KLOR-CON  M) 20 MEQ tablet Take 2 tablets (40 mEq total) by mouth daily. 11/04/23  Yes Milford, Harlene HERO, FNP  sertraline  (ZOLOFT ) 50 MG tablet Take 1.5 tablets (75 mg total) by mouth in the morning. 11/19/23 11/18/24 Yes Carvin Arvella HERO, MD  SYMBICORT  160-4.5 MCG/ACT inhaler Inhale 2 puffs into the lungs daily. 08/28/23  Yes [provider]  tamsulosin  (FLOMAX ) 0.4 MG CAPS capsule Take 2 capsules (0.8 mg total) by mouth daily. 11/25/23  Yes [name] Barnie NOVAK, MD  timolol  (TIMOPTIC ) 0.5 % ophthalmic solution Place 1 drop into both eyes every morning. 11/27/23  Yes [provider]  torsemide  (DEMADEX ) 20 MG tablet Take 2 tablets (40 mg total) by mouth daily. 11/04/23  Yes Milford, Harlene HERO, FNP  hydrALAZINE  (APRESOLINE ) 25 MG tablet Take 0.5 tablets (12.5 mg total) by mouth 3 (three) times  daily. Patient not taking: Reported on 12/19/2023 10/23/23 01/21/24  Glena Harlene HERO, FNP  Insulin  Pen Needle (TECHLITE PEN NEEDLES) 32G X 4 MM MISC Use to inject insulin  into the skin 3 times per day. 10/13/23   Regalado, [name] A, MD    Scheduled Meds:  amiodarone   400 mg Oral Daily   amoxicillin -clavulanate  1 tablet Oral Q12H   apixaban   5 mg Oral BID   atorvastatin   10 mg Oral Daily   clonazePAM   0.5 mg Oral BID   furosemide   40 mg Intravenous Daily   gabapentin   300  mg Oral QHS   insulin  aspart  0-20 Units Subcutaneous TID WC   insulin  aspart  0-5 Units Subcutaneous QHS   insulin  glargine-yfgn  40 Units Subcutaneous Daily   ipratropium-albuterol   3 mL Nebulization BID   metoprolol  succinate  12.5 mg Oral Daily   potassium chloride  SA  40 mEq Oral Daily   sertraline   75 mg Oral q AM   sodium chloride  flush  3 mL Intravenous Q12H   tamsulosin   0.8 mg Oral Daily   timolol   1 drop Both Eyes q morning   Continuous Infusions:  PRN Meds: acetaminophen , albuterol , benzonatate , ondansetron  (ZOFRAN ) IV, sodium chloride  flush  Allergies:    Allergies  Allergen Reactions   Bee Venom Anaphylaxis   Trulicity  [Dulaglutide ] Anaphylaxis and Diarrhea    Extra dose of Trulicity  caused cardiac arrest. Patient experience onset of diarrhea with using this medication - cleared after med stopped.    Social History:   Social History   Socioeconomic History   Marital status: Single    Spouse name: Not on file   Number of children: Not on file   Years of education: Not on file   Highest education level: Not on file  Occupational History   Occupation: Disabled  Tobacco Use   Smoking status: Former    Current packs/day: 0.00    Average packs/day: 1 pack/day for 28.0 years (28.0 ttl pk-yrs)    Types: Cigarettes    Start date: 06/03/1986    Quit date: 06/03/2014    Years since quitting: 9.5   Smokeless tobacco: Never  Vaping Use   Vaping status: Never Used  Substance and Sexual  Activity   Alcohol use: Not Currently   Drug use: Yes    Types: Marijuana    Comment: occasionally;   Sexual activity: Yes  Other Topics Concern   Not on file  Social History Narrative   Pt lives with his brother    Social Drivers of Health   Financial Resource Strain: Low Risk  (05/25/2023)   Overall Financial Resource Strain (CARDIA)    Difficulty of Paying Living Expenses: Not hard at all  Recent Concern: Financial Resource Strain - High Risk (03/24/2023)   Overall Financial Resource Strain (CARDIA)    Difficulty of Paying Living Expenses: Hard  Food Insecurity: No Food Insecurity (12/20/2023)   Hunger Vital Sign    Worried About Running Out of Food in the Last Year: Never true    Ran Out of Food in the Last Year: Never true  Transportation Needs: No Transportation Needs (12/20/2023)   PRAPARE - Administrator, Civil Service (Medical): No    Lack of Transportation (Non-Medical): No  Physical Activity: Inactive (05/25/2023)   Exercise Vital Sign    Days of Exercise per Week: 0 days    Minutes of Exercise per Session: 0 min  Stress: Stress Concern Present (05/25/2023)   [employer] of Occupational Health - Occupational Stress Questionnaire    Feeling of Stress : Rather much  Social Connections: Moderately Integrated (10/08/2023)   Social Connection and Isolation Panel    Frequency of Communication with Friends and Family: More than three times [name] week    Frequency of Social Gatherings with Friends and Family: More than three times [name] week    Attends Religious Services: More than 4 times per year    Active Member of [employer] or Organizations: Yes    Attends Banker Meetings: 1 to 4 times per year  Marital Status: Never married  Intimate Partner Violence: Not At Risk (12/20/2023)   Humiliation, Afraid, Rape, and Kick questionnaire    Fear of Current or Ex-Partner: No    Emotionally Abused: No    Physically Abused: No    Sexually Abused: No     Family History:    Family History  Problem Relation Age of Onset   Diabetes Father    Arthritis Father    Hypertension Father    Diabetes Mother    Arthritis Mother    Hypertension Mother    Hypertension Brother      ROS:  Please see the history of present illness.   All other ROS reviewed and negative.     Physical Exam/Data: Vitals:   12/20/23 2356 12/21/23 0504 12/21/23 0736 12/21/23 1119  BP: 114/60 (!) 115/55 (!) 126/97 (!) 98/54  Pulse: 74 83 84 82  Resp: 18 19 18 18   Temp: 98.6 F (37 C) 97.8 F (36.6 C) 98.1 F (36.7 C) 97.7 F (36.5 C)  TempSrc: Axillary Oral Oral Oral  SpO2: 94% 92% 94% 94%  Weight:  132.1 kg    Height:        Intake/Output Summary (Last 24 hours) at 12/21/2023 1332 Last data filed at 12/21/2023 1300 Gross per 24 hour  Intake 476 ml  Output 2225 ml  Net -1749 ml      12/21/2023    5:04 AM 12/20/2023    3:30 PM 12/19/2023    5:45 PM  Last 3 Weights  Weight (lbs) 291 lb 3.6 oz 289 lb 3.9 oz 289 lb 3.9 oz  Weight (kg) 132.1 kg 131.2 kg 131.2 kg     Body mass index is 38.42 kg/m.  General:  Well nourished, well developed, in no acute distress HEENT: normal Neck: no JVD Vascular: No carotid bruits; Distal pulses 2+ bilaterally Cardiac:  normal S1, S2; RRR; no murmur. Lungs:  clear to auscultation bilaterally, no wheezing, rhonchi or rales  Abd: soft, nontender, no hepatomegaly  Ext: no edema Musculoskeletal:  No deformities. Skin: warm and dry  Neuro:  no focal abnormalities noted Psych:  Normal affect   EKG:  The EKG was personally reviewed and demonstrates:  EKG showed sinus tachycardia with [name] rate of 104, right axis deviation, and first-degree AV block. Telemetry:  Telemetry was personally reviewed and demonstrates: Normal sinus rhythm with rates in the 60s to 80s.  Has brief runs of atrial paced rhythm.  Relevant CV Studies:   Laboratory Data: High Sensitivity Troponin:   Recent Labs  Lab 12/19/23 1756 12/19/23 1953   TROPONINIHS 18* 17     Chemistry Recent Labs  Lab 12/19/23 1756 12/21/23 1154  NA 137 134*  K 3.0* 3.5  CL 93* 90*  CO2 27 29  GLUCOSE 252* 258*  BUN 38* 62*  CREATININE 3.27* 3.00*  CALCIUM  9.7 9.6  MG  --  2.0  GFRNONAA 21* 23*  ANIONGAP 17* 15    No results for input(s): PROT, ALBUMIN, AST, ALT, ALKPHOS, BILITOT in the last 168 hours. Lipids No results for input(s): CHOL, TRIG, HDL, LABVLDL, LDLCALC, CHOLHDL in the last 168 hours.  Hematology Recent Labs  Lab 12/19/23 1756  WBC 8.4  RBC 4.62  HGB 14.1  HCT 42.5  MCV 92.0  MCH 30.5  MCHC 33.2  RDW 15.5  PLT 329   Thyroid  No results for input(s): TSH, FREET4 in the last 168 hours.  BNP Recent Labs  Lab 12/19/23 1756  BNP 27.0  DDimer  Recent Labs  Lab 12/21/23 1154  DDIMER <0.27    Radiology/Studies:  US  EKG SITE RITE Result Date: 12/20/2023 If Site Rite image not attached, placement could not be confirmed due to current cardiac rhythm.  DG Chest Port 1 View Result Date: 12/19/2023 CLINICAL DATA:  Chest pain EXAM: PORTABLE CHEST 1 VIEW COMPARISON:  Chest x-ray 10/10/2023 FINDINGS: The cardiac silhouette is enlarged, unchanged. Left lung base has been excluded from the exam. Left-sided pacemaker is unchanged in position. The lungs are clear. There is no pneumothorax or acute fracture. IMPRESSION: 1. No active disease. 2. Cardiomegaly. Electronically Signed   By: Greig Pique M.D.   On: 12/19/2023 18:44     Assessment and Plan: NAZARETH KIRK is [name] 58 y.o. male with [name] hx of HFrEF s/p Medtronic ICD, nonischemic cardiomyopathy, type 2 diabetes, CKD stage IIIb, prior DVT, Prior VT, and OSA who is being seen 12/21/2023 for the evaluation of CHF at the request of [name] Motto MD.  HFrEF s/p Medtronic ICD, nonischemic cardiomyopathy Was initially diagnosed with heart failure back in 2015.  Presented to the emergency department for chest pain, shortness of breath, and lower extremity  edema.  Has received 1 dose of 80 mg IV Lasix  and 2 doses of 40 mg IV Lasix .  Patient feels like lower extremity edema and shortness of breath have improved.  Creatinine has improved with diuresis. Will interrogate Medtronic ICD. Order echocardiogram Continue 40 mg IV Lasix  daily. May consider outpatient cardiac MRI.   History of VT On 04/2022 was admitted for VT that was specter to be secondary to electrolyte abnormalities and infection from [name] gluteal abscess. Continue 400 mg amiodarone  daily   Chest pain Borderline elevated high-sensitivity troponin 18>17 Chest pain is atypical for ACS. I suspect that borderline elevated high-sensitivity troponins are likely secondary to demand ischemia from heart failure.   History of DVT Continue Eliquis  5 mg daily.   Type 2 diabetes CKD Stage IIIb COPD exacerbation Was started on steroids for COPD exacerbation but developed hyperglycemia.  Management per primary   Otherwise manage per primary     Risk Assessment/Risk Scores:       New [place]  Heart Association (NYHA) Functional Class NYHA Class II       For questions or updates, please contact [facility] Please consult [email or web address] for contact info under    Signed, Morse Clause, PA-C  12/21/2023 1:32 PM   Patient seen and examined.  Agree with above documentation.  [name] is [name] 58 year old male with [name] history of chronic combined heart failure, CKD, OSA, VT, DVT who we are consulted by Dr. Bryn for evaluation of heart failure.  He follows in advanced heart failure clinic.  EF has been down to 20 to 25% in 08/2022.  Most recent echo 08/2023 showed EF had improved to 40 to 45%.  He was admitted 10/2023 with AKI, creatinine peaked at 4.6.  GDMT was held and he received IV fluids.  He was restarted on Toprol  and torsemide  on discharge, weight 279 pounds.  He was most recently seen in the clinic on 10/23/2023.  Weight was 290 pounds at that clinic visit.  He was volume  overloaded on exam, his torsemide  was increased to 40 mg Monday Wednesday Friday, 20 mg other days, metolazone  2.5 mg on Mondays.  Other GDMT included hydralazine  and Toprol , not on ACE/ARB/Arni or MRA with renal function and not on SGLT2 with history of groin cellulitis.  He has been on amiodarone   400 mg for VT.  On presentation to ED, initial vital signs notable for BP 120/72, pulse 97, SpO2 97% on 2 L.  Labs notable for creatinine 3.27 (increased from 2.76 on 6/5), potassium 3.0, BNP 27, troponin 18 > 17, hemoglobin 14.1, platelets 329, WBC 8.4, negative D-dimer.  EKG shows sinus tachycardia, rate 104, nonspecific intraventricular conduction delay.  On exam, patient is alert and oriented, regular rate and rhythm, no murmurs, diminished breath sounds, no LE edema.  For his acute on chronic combined heart failure, volume status difficult on exam.  He has been diuresing well, renal function improving, would continue IV Lasix  for now.  Will update echocardiogram  Lonni LITTIE Nanas, MD

## 2023-12-21 NOTE — Plan of Care (Signed)

## 2023-12-21 NOTE — Progress Notes (Signed)
 [facility] PROGRESS NOTE  DAEVON HOLDREN (DOB: 06-26-1965) FMW:[id number] PCP: Vicci Barnie NOVAK, MD Cardiology/HF team: Dr. Gardenia  Nephrology: Dr. Jerrye  Brief Narrative: [name] is a 58 y.o. male with a history of cardiac arrest status post AICD placement, NICM, atrial fibrillation on anticoagulation, T2DM, stage IIIb CKD  who presented to the ED on 12/19/2023 with worsening shortness of breath over the last couple of days. Approximately 7 days ago the patient was drinking some liquid and he got choked.  He thinks he aspirated.  The next morning he started coughing up a lot of phlegm.  It is a thick yellow phlegm which is green continue to get worse over the last week with wheezing, also noting weight gain (280lbs > 290lbs). Work up in the ED included nonacute CXR, though he required admission for AECOPD and acute CHF.   Subjective: Wheezing improved but weight stable, still feels grossly volume up. Dyspnea significantly worse than baseline still. No chest pain.   Objective: BP (!) 98/54 (BP Location: Right Arm)   Pulse 82   Temp 97.7 F (36.5 C) (Oral)   Resp 18   Ht 6' 1 (1.854 m)   Wt 132.1 kg   SpO2 94%   BMI 38.42 kg/m   Gen: No distress Pulm: Crackles and wheezes, nonlabored  CV: RRR, trace dependent edema GI: Soft, NT, ND, +BS  Neuro: Alert and oriented. No new focal deficits. Ext: Warm, no deformities. Skin: No new rashes, lesions or ulcers on visualized skin   Assessment & Plan: Principal Problem:   COPD with acute exacerbation (HCC) Active Problems:   Stage 3b chronic kidney disease (HCC)   Type 2 diabetes mellitus with hyperlipidemia (HCC)   Diabetic polyneuropathy associated with type 2 diabetes mellitus (HCC)   Acute renal failure (ARF) (HCC)   Acute exacerbation of CHF (congestive heart failure) (HCC)   Acute respiratory failure with hypoxia (HCC)  Acute shortness of breath SOB likely combined CHF exacerbation and AECOPD with aspiration:  BNP stable at baseline/normal (albeit BMI is 39), CXR reassuring though up 10lbs per pt report. D-dimer negative.   - Continuing empiric augmentin  given history of dyspnea and cough since choking episode.  - Continue nebs, with wheezing, we'll restart steroids.   - Continue supplemental oxygen  to maintain normal WOB and SpO2 >89%.  - PT/OT consulted, may benefit from cardiac rehab after discharge.   Very poor venous access:  - Discussed risk of PICC placement which was ordered at admission. Pt strongly prefers to avoid this. Note previous Hx DVT LUE Dx November 2024, has been adherent to eliquis  which we are continuing, following with with Dr. Loretha who has U/S last month showing only SVT, no DVT)  CHF, AFib, HTN:  - Continue IV lasix , cautious with impaired renal function. Labs have been a struggle to get, but Cr holding steady on check later this AM. Would appreciate cardiology opinion on volume status and diuresis. Pt of Dr. Gardenia.  - Continue amiodarone , metoprolol , eliquis    OSA: intolerant even of new CPAP machine at home  - CPAP nightly   AKI on stage IIIb CKD: SCr on admission is 3.2 from baseline 2.7.  - Continue trending, pt of Dr. Kelsey.  - Avoid nephrotoxins.    IDT2DM with steroid induced hyperglycemia:  - Augmented insulin  again today, increase glargine to 50u daily, give additional 10u now. Add 10u TIDWC novolog  to resistant SSI.   - Continue regular follow up with endocrinology, Dr. Trixie.  HLD:  - Continue statin  Depression:  - Continue SSRI  BPH:  - Continue tamsulosin   [name], MD [facility] [email or web address] 12/21/2023, 2:23 PM

## 2023-12-21 NOTE — Progress Notes (Signed)
 Heart Failure Navigator Progress Note  Assessed for Heart & Vascular TOC clinic readiness.  Patient does not meet criteria due to Advanced Heart Failure Team patient of Dr. Gasper Lloyd.   Navigator will sign off at this time.   Rhae Hammock, BSN, Scientist, clinical (histocompatibility and immunogenetics) Only

## 2023-12-21 NOTE — Inpatient Diabetes Management (Signed)
 Inpatient Diabetes Program Recommendations  AACE/ADA: New Consensus Statement on Inpatient Glycemic Control (2015)  Target Ranges:  Prepandial:   less than 140 mg/dL      Peak postprandial:   less than 180 mg/dL (1-2 hours)      Critically ill patients:  140 - 180 mg/dL    Latest Reference Range & Units 12/20/23 07:47 12/20/23 12:12 12/20/23 16:06 12/20/23 20:13  Glucose-Capillary 70 - 99 mg/dL 434 (HH)  20 units Novolog   40 units Semglee  464 (H)  40 units Novolog   370 (H)  20 units Novolog   288 (H)  3 units Novolog      Latest Reference Range & Units 12/21/23 06:33 12/21/23 11:16  Glucose-Capillary 70 - 99 mg/dL 781 (H)  7 units Novolog   257 (H)  11 units Novolog   40 units Semglee   (H): Data is abnormally high    Home DM Meds: Lantus  45 units at HS       Novolog  34 units TID  Current Orders: Semglee  40 units daily     Novolog  Resistant Correction Scale/ SSI (0-20 units) TID AC + HS   ENDO: Dr. Trixie Last seen 12/15/2023 Was told to Increase Lantus  to 40 units at HS and Take Novolog  30-38 units BID     Of note, pt got 125 mg Solumedrol 7pm on 07/12 CBG 565 yest AM Increased Semglee  dose to 40 units daily yest AM Pt also got 50 mg Prednisone  X 1 dose yest AM No more steroids ordered    MD- Please consider adding Novolog  Meal Coverage: Novolog  10 units TID with meals (about 1/3 home dose to start) HOLD if pt NPO HOLD if pt eats <50% meals    --Will follow patient during hospitalization--  Adina Rudolpho Arrow RN, MSN, CDCES Diabetes Coordinator Inpatient Glycemic Control Team Team Pager: [phone] (8a-5p)

## 2023-12-21 NOTE — Plan of Care (Signed)
  Problem: Coping: Goal: Ability to adjust to condition or change in health will improve Outcome: Progressing   Problem: Fluid Volume: Goal: Ability to maintain a balanced intake and output will improve Outcome: Progressing   Problem: Health Behavior/Discharge Planning: Goal: Ability to manage health-related needs will improve Outcome: Progressing

## 2023-12-22 ENCOUNTER — Inpatient Hospital Stay (HOSPITAL_COMMUNITY)

## 2023-12-22 DIAGNOSIS — I5043 Acute on chronic combined systolic (congestive) and diastolic (congestive) heart failure: Secondary | ICD-10-CM | POA: Diagnosis not present

## 2023-12-22 DIAGNOSIS — N179 Acute kidney failure, unspecified: Secondary | ICD-10-CM | POA: Diagnosis not present

## 2023-12-22 LAB — ECHOCARDIOGRAM COMPLETE
Area-P 1/2: 3.37 cm2
Height: 73 in
S' Lateral: 4.2 cm
Weight: 4613.79 [oz_av]

## 2023-12-22 LAB — BASIC METABOLIC PANEL WITH GFR
Anion gap: 15 (ref 5–15)
BUN: 64 mg/dL — ABNORMAL HIGH (ref 6–20)
CO2: 26 mmol/L (ref 22–32)
Calcium: 9.6 mg/dL (ref 8.9–10.3)
Chloride: 94 mmol/L — ABNORMAL LOW (ref 98–111)
Creatinine, Ser: 2.84 mg/dL — ABNORMAL HIGH (ref 0.61–1.24)
GFR, Estimated: 25 mL/min — ABNORMAL LOW (ref >60)
Glucose, Bld: 328 mg/dL — ABNORMAL HIGH (ref 70–99)
Potassium: 3.7 mmol/L (ref 3.5–5.1)
Sodium: 135 mmol/L (ref 135–145)

## 2023-12-22 LAB — GLUCOSE, CAPILLARY
Glucose-Capillary: 220 mg/dL — ABNORMAL HIGH (ref 70–99)
Glucose-Capillary: 305 mg/dL — ABNORMAL HIGH (ref 70–99)
Glucose-Capillary: 354 mg/dL — ABNORMAL HIGH (ref 70–99)
Glucose-Capillary: 289 mg/dL — ABNORMAL HIGH (ref 70–99)

## 2023-12-22 LAB — MAGNESIUM: Magnesium: 2.2 mg/dL (ref 1.7–2.4)

## 2023-12-22 MED ORDER — INSULIN GLARGINE-YFGN 100 UNIT/ML ~~LOC~~ SOLN
60.0000 [IU] | Freq: Every day | SUBCUTANEOUS | Status: DC
  Administered 2023-12-22 – 2023-12-23 (×2): 60 [IU] via SUBCUTANEOUS
  Filled 2023-12-22 (×2): qty 0.6

## 2023-12-22 MED ORDER — PERFLUTREN LIPID MICROSPHERE
1.0000 mL | INTRAVENOUS | Status: AC | PRN
  Administered 2023-12-22: 3 mL via INTRAVENOUS

## 2023-12-22 NOTE — Progress Notes (Addendum)
  Progress Note  Patient Name: [name] Date of Encounter: 12/22/2023 [facility] Cardiologist: Redell Shallow, MD   Interval Summary   Patient reports doing well with Lasix  Breathing improved Denies any acute complaints Creatinine almost back at baseline  Vital Signs Vitals:   12/21/23 2036 12/22/23 0006 12/22/23 0545 12/22/23 0727  BP: (!) 121/91 (!) 145/85 120/68 (!) 116/57  Pulse: 84 77 64 78  Resp: 18 18 18 18   Temp: 97.7 F (36.5 C) 97.9 F (36.6 C) 97.8 F (36.6 C) 97.8 F (36.6 C)  TempSrc: Oral Oral Oral Oral  SpO2: 93% 90% 96% 96%  Weight:   130.8 kg   Height:       Intake/Output Summary (Last 24 hours) at 12/22/2023 0912 Last data filed at 12/22/2023 0900 Gross per 24 hour  Intake 592 ml  Output 3550 ml  Net -2958 ml      12/22/2023    5:45 AM 12/21/2023    5:04 AM 12/20/2023    3:30 PM  Last 3 Weights  Weight (lbs) 288 lb 5.8 oz 291 lb 3.6 oz 289 lb 3.9 oz  Weight (kg) 130.8 kg 132.1 kg 131.2 kg     Telemetry/ECG  Sinus rhythm, HR 70s - Personally Reviewed  Physical Exam  GEN: No acute distress.   Neck: No JVD Cardiac: RRR, no murmurs, rubs, or gallops.  Respiratory: diminished breath sounds bilaterally. GI: Soft, nontender, non-distended  MS: No edema  Assessment & Plan  58 y.o. male with a hx of combined CHF s/p Medtronic ICD, nonischemic cardiomyopathy, type 2 diabetes, CKD stage IIIb, prior DVT, Prior VT, and OSA who is being seen for CHF exacerbation   Acute on chronic combined CHF S/p Medtronic ICD Follows in our AHF clinic  LVEF 20-25% in 11/2022, most recent LVEF 40-45% in 08/2023  Admitted May 2025 for AKI, weighed 279 lb at discharge Seen 10/23/2023 in clinic, weighed 290 lb, appeared volume overloaded, torsemide  was increased to 40 mg M/W/F, 20 mg other days, metolazone  2.5 mg on Mondays, continued on Toprol  and hydralazine   Not on ACE/ARB/ARNI or MRA with renal function, not on SGLT2i with history of groin cellulitis   Started on IV Lasix   Creatinine trending down, 2.84 today from 3.27 on arrival (baseline ~2.7) Weight  288 lb today, from 291 lb Net nearly - 5L this admission Pending updated echo  Continue IV Lasix  40 mg daily  Continue Toprol  12.5 mg daily   History of VT Continue PTA amiodarone  400 mg daily   Per primary Type 2 diabetes CKD stage 3b COPD History of DVT  For questions or updates, please contact [facility] Please consult [email or web address] for contact info under       Signed, Waddell DELENA Donath, PA-C   Patient seen and examined.  Agree with above documentation.  On exam, patient is alert and oriented, regular rate and rhythm, no murmurs, expiratory wheezing on exam, no LE edema, JVD difficult to assess given habitus.  Net -2.8 L yesterday, -4.8 L on admission.  BP 116/57.  Renal function continues to improve, creatinine 2.8 today.  Would continue IV Lasix .  Will follow-up echocardiogram.  Lonni LITTIE Nanas, MD

## 2023-12-22 NOTE — Progress Notes (Signed)
 [facility] PROGRESS NOTE  [name] (DOB: [birth year]) FMW:[id number] PCP: [name] Barnie NOVAK, MD Cardiology/HF team: Dr. Gardenia  Nephrology: Dr. Jerrye  Brief Narrative: [name] is a 58 y.o. male with a history of cardiac arrest status post AICD placement, NICM, atrial fibrillation on anticoagulation, T2DM, stage IIIb CKD  who presented to the ED on 12/19/2023 with worsening shortness of breath over the last couple of days. Approximately 7 days ago the patient was drinking some liquid and he got choked.  He thinks he aspirated.  The next morning he started coughing up a lot of phlegm.  It is a thick yellow phlegm which is green continue to get worse over the last week with wheezing, also noting weight gain (280lbs > 290lbs). Work up in the ED included nonacute CXR, though he required admission for AECOPD and acute CHF. Diuresing well, respiratory status stabilizing.   Subjective: Still feels like he has water weight on board, still dyspneic more than baseline, though wheezing improved.   Objective: BP 117/67 (BP Location: Right Arm)   Pulse 71   Temp 97.7 F (36.5 C) (Oral)   Resp 19   Ht 6' 1 (1.854 m)   Wt 130.8 kg   SpO2 93%   BMI 38.04 kg/m   Gen: No distress, pleasant gentleman Pulm: Nonlabored at rest but +expiratory wheezes  CV: RRR, trace pedal edema GI: Soft, NT, ND, +BS  Neuro: Alert and oriented. No new focal deficits. Ext: Warm, no deformities. Skin: No new rashes, lesions or ulcers on visualized skin   Assessment & Plan: Principal Problem:   COPD with acute exacerbation (HCC) Active Problems:   Stage 3b chronic kidney disease (HCC)   Type 2 diabetes mellitus with hyperlipidemia (HCC)   Diabetic polyneuropathy associated with type 2 diabetes mellitus (HCC)   VT (ventricular tachycardia) (HCC)   Acute renal failure (ARF) (HCC)   Acute exacerbation of CHF (congestive heart failure) (HCC)   Acute respiratory failure with hypoxia (HCC)  Acute  shortness of breath SOB likely combined CHF exacerbation and AECOPD with aspiration: BNP stable at baseline/normal (albeit BMI is 39), CXR reassuring though up 10lbs per pt report. D-dimer negative.   - Continuing empiric augmentin  given history of dyspnea and cough since choking episode.  - Continue nebs and steroids - Continue supplemental oxygen  to maintain normal WOB and SpO2 >89%.  - PT/OT consulted, may benefit from cardiac rehab after discharge.   Very poor venous access:  - Discussed risk of PICC placement which was ordered at admission. Pt strongly prefers to avoid this. Note previous Hx DVT LUE Dx November 2024, has been adherent to eliquis  which we are continuing, following with with Dr. Loretha who has U/S last month showing only SVT, no DVT)  Acute on chronic combined HFrEF, AFib, HTN:  - Continue IV lasix , follow up repeat echo - D/w cardiology, not planning RHC at this point. - Continue amiodarone , metoprolol , eliquis    OSA: intolerant even of new CPAP machine at home  - CPAP nightly   AKI on stage IIIb CKD: SCr on admission is 3.2 from baseline 2.7. Improving with diuresis thus far.  - Continue trending, pt of Dr. Kelsey.  - Avoid nephrotoxins.    IDT2DM with steroid induced hyperglycemia:  - Augmented insulin  glargine to 60u daily, continue 10u + resistant SSI with meals and HS coverage, titrate as needed.   - Continue regular follow up with endocrinology, Dr. Trixie.    HLD:  - Continue statin  Depression:  - Continue SSRI  BPH:  - Continue tamsulosin   [name] KATHEE Come, MD [facility] [email or web address] 12/22/2023, 1:47 PM

## 2023-12-23 DIAGNOSIS — I472 Ventricular tachycardia, unspecified: Secondary | ICD-10-CM

## 2023-12-23 DIAGNOSIS — I5043 Acute on chronic combined systolic (congestive) and diastolic (congestive) heart failure: Secondary | ICD-10-CM | POA: Diagnosis not present

## 2023-12-23 LAB — GLUCOSE, CAPILLARY
Glucose-Capillary: 198 mg/dL — ABNORMAL HIGH (ref 70–99)
Glucose-Capillary: 261 mg/dL — ABNORMAL HIGH (ref 70–99)
Glucose-Capillary: 386 mg/dL — ABNORMAL HIGH (ref 70–99)
Glucose-Capillary: 273 mg/dL — ABNORMAL HIGH (ref 70–99)

## 2023-12-23 LAB — RENAL FUNCTION PANEL
Albumin: 3.7 g/dL (ref 3.5–5.0)
Anion gap: 14 (ref 5–15)
BUN: 65 mg/dL — ABNORMAL HIGH (ref 6–20)
CO2: 28 mmol/L (ref 22–32)
Calcium: 9.9 mg/dL (ref 8.9–10.3)
Chloride: 96 mmol/L — ABNORMAL LOW (ref 98–111)
Creatinine, Ser: 2.66 mg/dL — ABNORMAL HIGH (ref 0.61–1.24)
GFR, Estimated: 27 mL/min — ABNORMAL LOW (ref >60)
Glucose, Bld: 249 mg/dL — ABNORMAL HIGH (ref 70–99)
Phosphorus: 4.1 mg/dL (ref 2.5–4.6)
Potassium: 3.4 mmol/L — ABNORMAL LOW (ref 3.5–5.1)
Sodium: 138 mmol/L (ref 135–145)

## 2023-12-23 MED ORDER — INSULIN ASPART 100 UNIT/ML IJ SOLN
6.0000 [IU] | Freq: Three times a day (TID) | INTRAMUSCULAR | Status: DC
  Administered 2023-12-23 – 2023-12-29 (×15): 6 [IU] via SUBCUTANEOUS

## 2023-12-23 MED ORDER — INSULIN GLARGINE-YFGN 100 UNIT/ML ~~LOC~~ SOLN
70.0000 [IU] | Freq: Every day | SUBCUTANEOUS | Status: DC
  Administered 2023-12-24 – 2023-12-28 (×5): 70 [IU] via SUBCUTANEOUS
  Filled 2023-12-23 (×5): qty 0.7

## 2023-12-23 MED ORDER — POTASSIUM CHLORIDE CRYS ER 20 MEQ PO TBCR
40.0000 meq | EXTENDED_RELEASE_TABLET | Freq: Once | ORAL | Status: AC
  Administered 2023-12-23: 40 meq via ORAL

## 2023-12-23 NOTE — Progress Notes (Addendum)
 Progress Note  Patient Name: [name] Date of Encounter: 12/23/2023 [facility] Cardiologist: Redell Shallow, MD   Interval Summary   Creatinine back at baseline  Patient still reports he feels like he's holding onto water weight Discharged at 279 lb, weights in 289 lb today   Vital Signs Vitals:   12/23/23 0012 12/23/23 0300 12/23/23 0315 12/23/23 0755  BP: (!) 143/85  (!) 147/86 117/64  Pulse: 64  65 65  Resp: 15  16 18   Temp: 97.6 F (36.4 C)  97.6 F (36.4 C) 97.6 F (36.4 C)  TempSrc:   Oral Oral  SpO2: 92%  94% 90%  Weight:  131.1 kg    Height:       Intake/Output Summary (Last 24 hours) at 12/23/2023 0917 Last data filed at 12/23/2023 0600 Gross per 24 hour  Intake 413 ml  Output 2300 ml  Net -1887 ml      12/23/2023    3:00 AM 12/22/2023    5:45 AM 12/21/2023    5:04 AM  Last 3 Weights  Weight (lbs) 289 lb 0.4 oz 288 lb 5.8 oz 291 lb 3.6 oz  Weight (kg) 131.1 kg 130.8 kg 132.1 kg     Telemetry/ECG  Sinus rhythm, HR 60s - Personally Reviewed  Physical Exam  GEN: No acute distress.   Neck: difficult to assess JVD Cardiac: RRR, no murmurs, rubs, or gallops.  Respiratory: diminished lung sounds, expiratory wheezing. GI: Soft, nontender, non-distended  MS: No edema  Assessment & Plan  58 y.o. male with a hx of combined CHF s/p Medtronic ICD, nonischemic cardiomyopathy, type 2 diabetes, CKD stage IIIb, prior DVT, Prior VT, and OSA who is being seen for CHF exacerbation    Acute on chronic combined CHF S/p Medtronic ICD Follows in our AHF clinic  LVEF 20-25% in 11/2022, most recent LVEF 40-45% in 08/2023  Admitted May 2025 for AKI, weighed 279 lb at discharge Seen 10/23/2023 in clinic, weighed 290 lb, appeared volume overloaded, torsemide  was increased to 40 mg M/W/F, 20 mg other days, metolazone  2.5 mg on Mondays, continued on Toprol  and hydralazine   Not on ACE/ARB/ARNI or MRA with renal function, not on SGLT2i with history of groin cellulitis   Started on IV Lasix   Creatinine trending down, 2.66 today from 3.27 on arrival (baseline ~2.7) Weight  289 lb today, from 291 lb Net -6.8 L this admission Updated echo showed: LVEF 40-45%, moderate LVH, G1DD, normal RV, dialted LA, normal IVC -- no significant changes from prior study 08/2023 Most recent BP 117/64, HR 60s Continue IV Lasix  40 mg for at least one more day, consider echo findings, renal function back at baseline consider switching to maintenance PO dose tomorrow  Continue to monitor strict I&O's, daily weights, daily BMPs Continue Toprol  12.5 mg daily    History of VT Continue PTA amiodarone  400 mg daily    Per primary Type 2 diabetes CKD stage 3b COPD History of DVT   For questions or updates, please contact [facility] Please consult [email or web address] for contact info under       Signed, [name] DELENA Donath, PA-C    Patient seen and examined.  Agree with above documentation.  On exam, patient is alert and oriented, regular rate and rhythm, no murmurs, lungs CTAB, no LE edema.  Net negative 2L yesterday, negative 6.8L on admission. Renal function continues to improve, Cr 2.66 today.   Echo yesterday with EF 40-45%, RAP 3.  He received IV lasix   this morning, suspect will be able to transition to PO torsemide  tomorrow.  [name] LITTIE Nanas, MD

## 2023-12-23 NOTE — Progress Notes (Signed)
   12/23/23 1953  BiPAP/CPAP/SIPAP  BiPAP/CPAP/SIPAP Pt Type Adult  BiPAP/CPAP/SIPAP  (home unit)  Reason BIPAP/CPAP not in use Other(comment) (Pt  has his home CPAP. Pt places himself on as needed. RT will assist as needed.)  Mask Type Full face mask  Respiratory Rate 18 breaths/min  Patient Home Machine Yes  Safety Check Completed by RT for Home Unit Yes, no issues noted  Patient Home Mask Yes  Patient Home Tubing Yes  Device Plugged into RED Power Outlet Yes  BiPAP/CPAP /SiPAP Vitals  Pulse Rate 67  Resp 16  SpO2 94 %  Bilateral Breath Sounds Clear;Diminished  MEWS Score/Color  MEWS Score 0  MEWS Score Color [name]

## 2023-12-23 NOTE — Progress Notes (Addendum)
 PROGRESS NOTE    [name]  FMW:[id number] DOB: [birth year] DOA: 12/19/2023 PCP: [name] Barnie NOVAK, MD   58/M w cardiac arrest status post AICD placement, NICM, atrial fibrillation on anticoagulation, T2DM, stage IIIb CKD  who presented to the ED on 12/19/2023 with worsening shortness of breath over the last couple of days. Work up in the ED included nonacute CXR, admitted with CHF exacerbation and?  COPD   Subjective: -Feels better overall, breathing continues to improve  Assessment and Plan:  Acute on chronic systolic CHF -Repeat echo with EF 40-45%, moderate LVH, grade 1 DD, normal RV -GDMT limited by CKD -Improving with diuresis, 6.8 L negative, continue IV Lasix  1 more day -Cards following, continue Toprol   ?  Aspiration - History of aspiration like episode prior to admission - Chest x-ray unremarkable, no fever or leukocytosis, discontinue antibiotics and monitor  History of VT Continue amiodarone    OSA: intolerant even of new CPAP machine at home  - CPAP nightly   AKI on stage IIIb CKD: SCr on admission is 3.2 from baseline 2.7. Improving with diuresis thus far.  - Continue trending, pt of Dr. Kelsey.  - Avoid nephrotoxins.    IDT2DM with steroid induced hyperglycemia:  - CBG's uncontrolled on prednisone , discontinuing steroids today, increase Semglee  - FU with endocrinology, [name].    HLD:  - Continue statin   Depression:  - Continue SSRI   BPH:  - Continue tamsulosin     DVT prophylaxis: Apixaban  Code Status: NR Family Communication: None present Disposition Plan:   Consultants:    Procedures:   Antimicrobials:    Objective: Vitals:   12/23/23 0012 12/23/23 0300 12/23/23 0315 12/23/23 0755  BP: (!) 143/85  (!) 147/86 117/64  Pulse: 64  65 65  Resp: 15  16 18   Temp: 97.6 F (36.4 C)  97.6 F (36.4 C) 97.6 F (36.4 C)  TempSrc:   Oral Oral  SpO2: 92%  94% 90%  Weight:  131.1 kg    Height:        Intake/Output Summary  (Last 24 hours) at 12/23/2023 1213 Last data filed at 12/23/2023 0600 Gross per 24 hour  Intake 413 ml  Output 2300 ml  Net -1887 ml   Filed Weights   12/21/23 0504 12/22/23 0545 12/23/23 0300  Weight: 132.1 kg 130.8 kg 131.1 kg    Examination:  General exam: Appears calm and comfortable  HEENT: Neck obese unable to assess JVD Respiratory system: Decreased breath sounds at the bases Cardiovascular system: S1 & S2 heard, RRR.  Abd: nondistended, soft and nontender.Normal bowel sounds heard. Central nervous system: Alert and oriented. No focal neurological deficits. Extremities: no edema Skin: No rashes Psychiatry:  Mood & affect appropriate.     Data Reviewed:   CBC: Recent Labs  Lab 12/19/23 1756  WBC 8.4  HGB 14.1  HCT 42.5  MCV 92.0  PLT 329   Basic Metabolic Panel: Recent Labs  Lab 12/19/23 1756 12/21/23 1154 12/22/23 0250 12/23/23 0227  NA 137 134* 135 138  K 3.0* 3.5 3.7 3.4*  CL 93* 90* 94* 96*  CO2 27 29 26 28   GLUCOSE 252* 258* 328* 249*  BUN 38* 62* 64* 65*  CREATININE 3.27* 3.00* 2.84* 2.66*  CALCIUM  9.7 9.6 9.6 9.9  MG  --  2.0 2.2  --   PHOS  --   --   --  4.1   GFR: Estimated Creatinine Clearance: 42.5 mL/min (A) (by C-G formula based on SCr  of 2.66 mg/dL (H)). Liver Function Tests: Recent Labs  Lab 12/23/23 0227  ALBUMIN 3.7   No results for input(s): LIPASE, AMYLASE in the last 168 hours. No results for input(s): AMMONIA in the last 168 hours. Coagulation Profile: No results for input(s): INR, PROTIME in the last 168 hours. Cardiac Enzymes: No results for input(s): CKTOTAL, CKMB, CKMBINDEX, TROPONINI in the last 168 hours. BNP (last 3 results) No results for input(s): PROBNP in the last 8760 hours. HbA1C: No results for input(s): HGBA1C in the last 72 hours. CBG: Recent Labs  Lab 12/22/23 1102 12/22/23 1603 12/22/23 2053 12/23/23 0602 12/23/23 1128  GLUCAP 305* 354* 289* 198* 261*   Lipid  Profile: No results for input(s): CHOL, HDL, LDLCALC, TRIG, CHOLHDL, LDLDIRECT in the last 72 hours. Thyroid  Function Tests: No results for input(s): TSH, T4TOTAL, FREET4, T3FREE, THYROIDAB in the last 72 hours. Anemia Panel: No results for input(s): VITAMINB12, FOLATE, FERRITIN, TIBC, IRON, RETICCTPCT in the last 72 hours. Urine analysis:    Component Value Date/Time   COLORURINE STRAW (A) 10/08/2023 0952   APPEARANCEUR CLEAR 10/08/2023 0952   APPEARANCEUR Clear 07/17/2020 1655   LABSPEC 1.009 10/08/2023 0952   PHURINE 5.0 10/08/2023 0952   GLUCOSEU 50 (A) 10/08/2023 0952   HGBUR NEGATIVE 10/08/2023 0952   BILIRUBINUR NEGATIVE 10/08/2023 0952   BILIRUBINUR negative 01/30/2022 1705   BILIRUBINUR Negative 07/17/2020 1655   KETONESUR NEGATIVE 10/08/2023 0952   PROTEINUR NEGATIVE 10/08/2023 0952   UROBILINOGEN 0.2 01/30/2022 1705   UROBILINOGEN 1.0 06/20/2013 1032   NITRITE NEGATIVE 10/08/2023 0952   LEUKOCYTESUR NEGATIVE 10/08/2023 0952   Sepsis Labs: @LABRCNTIP (procalcitonin:4,lacticidven:4)  )No results found for this or any previous visit (from the past 240 hours).   Radiology Studies: ECHOCARDIOGRAM COMPLETE Result Date: 12/22/2023    ECHOCARDIOGRAM REPORT   Patient Name:   [name] Date of Exam: 12/22/2023 Medical Rec #:  [id number]      Height:       73.0 in Accession #:    [id number]     Weight:       288.4 lb Date of Birth:  [birth year]      BSA:          2.514 m Patient Age:    58 years       BP:           116/57 mmHg Patient Gender: M              HR:           66 bpm. Exam Location:  Inpatient Procedure: 2D Echo, Cardiac Doppler, Color Doppler and Intracardiac            Opacification Agent (Both Spectral and Color Flow Doppler were            utilized during procedure). Indications:    CHF  History:        Patient has prior history of Echocardiogram examinations, most                 recent 09/03/2023. Defibrillator, COPD; Risk  Factors:Diabetes,                 Hypertension and Dyslipidemia.  Sonographer:    Philomena Daring Referring Phys: [id number] LONNI LITTIE NANAS  Sonographer Comments: Patient is obese. IMPRESSIONS  1. Left ventricular ejection fraction, by estimation, is 40 to 45%. The left ventricle has mildly decreased function. The left ventricle demonstrates global hypokinesis. The left ventricular internal cavity size was mildly  dilated. There is moderate concentric left ventricular hypertrophy. Left ventricular diastolic parameters are consistent with Grade I diastolic dysfunction (impaired relaxation).  2. Right ventricular systolic function is normal. The right ventricular size is normal. Tricuspid regurgitation signal is inadequate for assessing PA pressure.  3. Left atrial size was mildly dilated.  4. The mitral valve is normal in structure. No evidence of mitral valve regurgitation.  5. The aortic valve is tricuspid. Aortic valve regurgitation is not visualized. No aortic stenosis is present.  6. The inferior vena cava is normal in size with greater than 50% respiratory variability, suggesting right atrial pressure of 3 mmHg. Comparison(s): No significant change from prior study. Prior images reviewed side by side. FINDINGS  Left Ventricle: Left ventricular ejection fraction, by estimation, is 40 to 45%. The left ventricle has mildly decreased function. The left ventricle demonstrates global hypokinesis. The left ventricular internal cavity size was mildly dilated. There is  moderate concentric left ventricular hypertrophy. Left ventricular diastolic parameters are consistent with Grade I diastolic dysfunction (impaired relaxation). Right Ventricle: The right ventricular size is normal. Right vetricular wall thickness was not well visualized. Right ventricular systolic function is normal. Tricuspid regurgitation signal is inadequate for assessing PA pressure. Left Atrium: Left atrial size was mildly dilated. Right Atrium:  Right atrial size was not well visualized. Pericardium: There is no evidence of pericardial effusion. Mitral Valve: The mitral valve is normal in structure. No evidence of mitral valve regurgitation. Tricuspid Valve: The tricuspid valve is not well visualized. Tricuspid valve regurgitation is not demonstrated. Aortic Valve: The aortic valve is tricuspid. Aortic valve regurgitation is not visualized. No aortic stenosis is present. Pulmonic Valve: The pulmonic valve was grossly normal. Pulmonic valve regurgitation is mild. No evidence of pulmonic stenosis. Aorta: The aortic root and ascending aorta are structurally normal, with no evidence of dilitation. Venous: The inferior vena cava is normal in size with greater than 50% respiratory variability, suggesting right atrial pressure of 3 mmHg. IAS/Shunts: The interatrial septum was not well visualized. Additional Comments: A device lead is visualized.  LEFT VENTRICLE PLAX 2D LVIDd:         5.90 cm   Diastology LVIDs:         4.20 cm   LV e' medial:   3.81 cm/s LV PW:         1.30 cm   LV E/e' medial: 14.3 LV IVS:        1.50 cm LVOT diam:     2.60 cm LV SV:         99 LV SV Index:   39 LVOT Area:     5.31 cm  IVC IVC diam: 1.60 cm LEFT ATRIUM             Index        RIGHT ATRIUM           Index LA diam:        3.60 cm 1.43 cm/m   RA Area:     14.80 cm LA Vol (A2C):   41.7 ml 16.59 ml/m  RA Volume:   39.00 ml  15.52 ml/m LA Vol (A4C):   41.6 ml 16.55 ml/m LA Biplane Vol: 42.3 ml 16.83 ml/m  AORTIC VALVE LVOT Vmax:   86.50 cm/s LVOT Vmean:  58.400 cm/s LVOT VTI:    0.186 m  AORTA Ao Root diam: 3.70 cm Ao Asc diam:  3.40 cm MITRAL VALVE MV Area (PHT): 3.37 cm    SHUNTS MV Decel  Time: 225 msec    Systemic VTI:  0.19 m MV E velocity: 54.50 cm/s  Systemic Diam: 2.60 cm MV A velocity: 97.90 cm/s MV E/A ratio:  0.56 Mihai Croitoru MD Electronically signed by Jerel Balding MD Signature Date/Time: 12/22/2023/4:07:17 PM    Final      Scheduled Meds:  amiodarone   400  mg Oral Daily   amoxicillin -clavulanate  1 tablet Oral Q12H   apixaban   5 mg Oral BID   atorvastatin   10 mg Oral Daily   clonazePAM   0.5 mg Oral BID   furosemide   40 mg Intravenous Daily   gabapentin   300 mg Oral QHS   insulin  aspart  0-20 Units Subcutaneous TID WC   insulin  aspart  0-5 Units Subcutaneous QHS   insulin  aspart  10 Units Subcutaneous TID WC   [START ON 12/24/2023] insulin  glargine-yfgn  70 Units Subcutaneous Daily   ipratropium-albuterol   3 mL Nebulization BID   metoprolol  succinate  12.5 mg Oral Daily   potassium chloride  SA  40 mEq Oral Daily   predniSONE   40 mg Oral Q breakfast   sertraline   75 mg Oral q AM   sodium chloride  flush  3 mL Intravenous Q12H   tamsulosin   0.8 mg Oral Daily   timolol   1 drop Both Eyes q morning   Continuous Infusions:   LOS: 4 days    Time spent:    Sigurd Pac, MD [facility]   12/23/2023, 12:13 PM

## 2023-12-23 NOTE — Plan of Care (Signed)
 Pt appears to be coping and understands prognosis as he verbalized

## 2023-12-23 NOTE — Progress Notes (Signed)
 Mobility Specialist Progress Note:   12/23/23 1100  Mobility  Activity Ambulated with assistance in hallway  Level of Assistance Standby assist, set-up cues, supervision of patient - no hands on  Assistive Device None  Distance Ambulated (ft) 250 ft  Activity Response Tolerated well  Mobility Referral Yes  Mobility visit 1 Mobility  Mobility Specialist Start Time (ACUTE ONLY) 1100  Mobility Specialist Stop Time (ACUTE ONLY) 1115  Mobility Specialist Time Calculation (min) (ACUTE ONLY) 15 min   Pt agreeable to mobility session. Required no physical assistance to ambulate with no AD use. VSS on RA throughout, however pt endorses SOB/fatigue. Back in bed with all needs met.   Therisa Rana Mobility Specialist Please contact via SecureChat or  Rehab office at [phone]

## 2023-12-24 DIAGNOSIS — I5043 Acute on chronic combined systolic (congestive) and diastolic (congestive) heart failure: Secondary | ICD-10-CM | POA: Diagnosis not present

## 2023-12-24 DIAGNOSIS — N179 Acute kidney failure, unspecified: Secondary | ICD-10-CM | POA: Diagnosis not present

## 2023-12-24 LAB — BASIC METABOLIC PANEL WITH GFR
Anion gap: 14 (ref 5–15)
BUN: 56 mg/dL — ABNORMAL HIGH (ref 6–20)
CO2: 24 mmol/L (ref 22–32)
Calcium: 9.6 mg/dL (ref 8.9–10.3)
Chloride: 99 mmol/L (ref 98–111)
Creatinine, Ser: 2.49 mg/dL — ABNORMAL HIGH (ref 0.61–1.24)
GFR, Estimated: 29 mL/min — ABNORMAL LOW (ref >60)
Glucose, Bld: 181 mg/dL — ABNORMAL HIGH (ref 70–99)
Potassium: 3.8 mmol/L (ref 3.5–5.1)
Sodium: 137 mmol/L (ref 135–145)

## 2023-12-24 LAB — GLUCOSE, CAPILLARY
Glucose-Capillary: 120 mg/dL — ABNORMAL HIGH (ref 70–99)
Glucose-Capillary: 188 mg/dL — ABNORMAL HIGH (ref 70–99)
Glucose-Capillary: 179 mg/dL — ABNORMAL HIGH (ref 70–99)
Glucose-Capillary: 238 mg/dL — ABNORMAL HIGH (ref 70–99)

## 2023-12-24 LAB — MAGNESIUM: Magnesium: 2.3 mg/dL (ref 1.7–2.4)

## 2023-12-24 MED ORDER — TORSEMIDE 20 MG PO TABS
40.0000 mg | ORAL_TABLET | Freq: Every day | ORAL | Status: DC
  Administered 2023-12-24 – 2023-12-29 (×6): 40 mg via ORAL
  Filled 2023-12-24 (×6): qty 2

## 2023-12-24 NOTE — Plan of Care (Signed)
  Problem: Coping: Goal: Ability to adjust to condition or change in health will improve Outcome: Progressing   Problem: Health Behavior/Discharge Planning: Goal: Ability to manage health-related needs will improve Outcome: Progressing   Problem: Skin Integrity: Goal: Risk for impaired skin integrity will decrease Outcome: Progressing

## 2023-12-24 NOTE — Progress Notes (Signed)
  Progress Note  Patient Name: [name] Date of Encounter: 12/24/2023 [facility] Cardiologist: Redell Shallow, MD   Interval Summary   BP 109/52.  Net negative 1.3L yesterday, negative 8L on admission.  Renal function continues to improve, creatinine 2.49 today.  He reports dyspnea has improved  Vital Signs Vitals:   12/23/23 2017 12/24/23 0106 12/24/23 0535 12/24/23 0754  BP: 117/66 116/75 128/78 (!) 109/52  Pulse: 67 63 63 65  Resp: 18 18 18 18   Temp: 97.8 F (36.6 C) 97.6 F (36.4 C) 97.6 F (36.4 C) 97.6 F (36.4 C)  TempSrc: Oral Oral Oral Oral  SpO2: 92% 91% 97% 96%  Weight:   131 kg   Height:       Intake/Output Summary (Last 24 hours) at 12/24/2023 0837 Last data filed at 12/24/2023 9370 Gross per 24 hour  Intake 240 ml  Output 1500 ml  Net -1260 ml      12/24/2023    5:35 AM 12/23/2023    3:00 AM 12/22/2023    5:45 AM  Last 3 Weights  Weight (lbs) 288 lb 12.8 oz 289 lb 0.4 oz 288 lb 5.8 oz  Weight (kg) 131 kg 131.1 kg 130.8 kg     Telemetry/ECG  Sinus rhythm, HR 60s - Personally Reviewed  Physical Exam  GEN: No acute distress.   Neck: difficult to assess JVD Cardiac: RRR, no murmurs, rubs, or gallops.  Respiratory: CTAB GI: Soft, nontender, non-distended  MS: No edema  Assessment & Plan  58 y.o. male with a hx of combined CHF s/p Medtronic ICD, nonischemic cardiomyopathy, type 2 diabetes, CKD stage IIIb, prior DVT, Prior VT, and OSA who is being seen for CHF exacerbation    Acute on chronic combined CHF S/p Medtronic ICD Follows in our AHF clinic  LVEF 20-25% in 11/2022, most recent LVEF 40-45% in 08/2023  Admitted May 2025 for AKI, weighed 279 lb at discharge Seen 10/23/2023 in clinic, weighed 290 lb, appeared volume overloaded, torsemide  was increased to 40 mg M/W/F, 20 mg other days, metolazone  2.5 mg on Mondays, continued on Toprol  and hydralazine   Not on ACE/ARB/ARNI or MRA with renal function, not on SGLT2i with history of groin  cellulitis  Started on IV Lasix   Net - 8 L this admission Updated echo showed: LVEF 40-45%, moderate LVH, G1DD, normal RV, dialted LA, normal IVC -- no significant changes from prior study 08/2023 Has diuresed well, renal function now back to baseline.  Volume status difficult to assess on exam but suspect euvolemic, echo shows small/collapsible IVC.  Will transition to p.o. torsemide  today Continue to monitor strict I&O's, daily weights, daily BMPs Continue Toprol  12.5 mg daily    History of VT Continue PTA amiodarone  400 mg daily    Per primary Type 2 diabetes CKD stage 3b COPD History of DVT   For questions or updates, please contact [facility] Please consult [email or web address] for contact info under       Signed, Lonni LITTIE Nanas, MD

## 2023-12-24 NOTE — Progress Notes (Signed)
 Mobility Specialist Progress Note:   12/24/23 0920  Mobility  Activity Ambulated with assistance in hallway  Level of Assistance Standby assist, set-up cues, supervision of patient - no hands on  Assistive Device None  Distance Ambulated (ft) 300 ft  Activity Response Tolerated well  Mobility Referral Yes  Mobility visit 1 Mobility  Mobility Specialist Start Time (ACUTE ONLY) 0920  Mobility Specialist Stop Time (ACUTE ONLY) 0935  Mobility Specialist Time Calculation (min) (ACUTE ONLY) 15 min   Pt agreeable to mobility session. Required no physical assistance throughout. VSS on RA with exertion, however pt continues to endorse SOB/fatigue. Back in bed with all needs met.   Therisa Rana Mobility Specialist Please contact via SecureChat or  Rehab office at [phone]

## 2023-12-24 NOTE — Progress Notes (Signed)
 PROGRESS NOTE    [name]  FMW:[id number] DOB: 1966/02/14 DOA: 12/19/2023 PCP: Vicci Barnie NOVAK, MD   58/M w cardiac arrest status post AICD placement, NICM, atrial fibrillation on anticoagulation, T2DM, stage IIIb CKD  who presented to the ED on 12/19/2023 with worsening shortness of breath over the last couple of days. Work up in the ED included nonacute CXR, admitted with CHF exacerbation and?  COPD   Subjective: -Feels better overall, breathing continues to improve  Assessment and Plan:  Acute on chronic systolic CHF -Repeat echo with EF 40-45%, moderate LVH, grade 1 DD, normal RV -GDMT limited by CKD -Improving with diuresis, 8 L negative, - Cards following, transitioning to oral torsemide  today -GDMT limited by CKD -Cards following, continue Toprol   ?  Aspiration - History of aspiration like episode prior to admission - Chest x-ray unremarkable, no fever or leukocytosis, discontinued antibiotics   History of VT Continue amiodarone    OSA: intolerant even of new CPAP machine at home  - CPAP nightly   AKI on stage IIIb CKD: SCr on admission is 3.2 from baseline 2.7. Improving with diuresis thus far.  - Continue trending, pt of Dr. Kelsey.  - Avoid nephrotoxins.    IDT2DM with steroid induced hyperglycemia:  - CBG's uncontrolled on prednisone , discontinuing steroids today, increase Semglee  - FU with endocrinology, Dr. Trixie.    HLD:  - Continue statin   Depression:  - Continue SSRI   BPH:  - Continue tamsulosin     DVT prophylaxis: Apixaban  Code Status: NR Family Communication: brother at present Disposition Plan:   Consultants:    Procedures:   Antimicrobials:    Objective: Vitals:   12/23/23 2017 12/24/23 0106 12/24/23 0535 12/24/23 0754  BP: 117/66 116/75 128/78 (!) 109/52  Pulse: 67 63 63 65  Resp: 18 18 18 18   Temp: 97.8 F (36.6 C) 97.6 F (36.4 C) 97.6 F (36.4 C) 97.6 F (36.4 C)  TempSrc: Oral Oral Oral Oral  SpO2: 92%  91% 97% 96%  Weight:   131 kg   Height:        Intake/Output Summary (Last 24 hours) at 12/24/2023 1200 Last data filed at 12/24/2023 9370 Gross per 24 hour  Intake 240 ml  Output 1500 ml  Net -1260 ml   Filed Weights   12/22/23 0545 12/23/23 0300 12/24/23 0535  Weight: 130.8 kg 131.1 kg 131 kg    Examination:  General exam: Appears calm and comfortable  HEENT: Neck obese unable to assess JVD Respiratory system: Decreased breath sounds at the bases Cardiovascular system: S1 & S2 heard, RRR.  Abd: nondistended, soft and nontender.Normal bowel sounds heard. Central nervous system: Alert and oriented. No focal neurological deficits. Extremities: no edema Skin: No rashes Psychiatry:  Mood & affect appropriate.     Data Reviewed:   CBC: Recent Labs  Lab 12/19/23 1756  WBC 8.4  HGB 14.1  HCT 42.5  MCV 92.0  PLT 329   Basic Metabolic Panel: Recent Labs  Lab 12/19/23 1756 12/21/23 1154 12/22/23 0250 12/23/23 0227 12/24/23 0230  NA 137 134* 135 138 137  K 3.0* 3.5 3.7 3.4* 3.8  CL 93* 90* 94* 96* 99  CO2 27 29 26 28 24   GLUCOSE 252* 258* 328* 249* 181*  BUN 38* 62* 64* 65* 56*  CREATININE 3.27* 3.00* 2.84* 2.66* 2.49*  CALCIUM  9.7 9.6 9.6 9.9 9.6  MG  --  2.0 2.2  --  2.3  PHOS  --   --   --  4.1  --    GFR: Estimated Creatinine Clearance: 45.3 mL/min (A) (by C-G formula based on SCr of 2.49 mg/dL (H)). Liver Function Tests: Recent Labs  Lab 12/23/23 0227  ALBUMIN 3.7   No results for input(s): LIPASE, AMYLASE in the last 168 hours. No results for input(s): AMMONIA in the last 168 hours. Coagulation Profile: No results for input(s): INR, PROTIME in the last 168 hours. Cardiac Enzymes: No results for input(s): CKTOTAL, CKMB, CKMBINDEX, TROPONINI in the last 168 hours. BNP (last 3 results) No results for input(s): PROBNP in the last 8760 hours. HbA1C: No results for input(s): HGBA1C in the last 72 hours. CBG: Recent Labs  Lab  12/23/23 0602 12/23/23 1128 12/23/23 1610 12/23/23 2124 12/24/23 0628  GLUCAP 198* 261* 386* 273* 120*   Lipid Profile: No results for input(s): CHOL, HDL, LDLCALC, TRIG, CHOLHDL, LDLDIRECT in the last 72 hours. Thyroid  Function Tests: No results for input(s): TSH, T4TOTAL, FREET4, T3FREE, THYROIDAB in the last 72 hours. Anemia Panel: No results for input(s): VITAMINB12, FOLATE, FERRITIN, TIBC, IRON, RETICCTPCT in the last 72 hours. Urine analysis:    Component Value Date/Time   COLORURINE STRAW (A) 10/08/2023 0952   APPEARANCEUR CLEAR 10/08/2023 0952   APPEARANCEUR Clear 07/17/2020 1655   LABSPEC 1.009 10/08/2023 0952   PHURINE 5.0 10/08/2023 0952   GLUCOSEU 50 (A) 10/08/2023 0952   HGBUR NEGATIVE 10/08/2023 0952   BILIRUBINUR NEGATIVE 10/08/2023 0952   BILIRUBINUR negative 01/30/2022 1705   BILIRUBINUR Negative 07/17/2020 1655   KETONESUR NEGATIVE 10/08/2023 0952   PROTEINUR NEGATIVE 10/08/2023 0952   UROBILINOGEN 0.2 01/30/2022 1705   UROBILINOGEN 1.0 06/20/2013 1032   NITRITE NEGATIVE 10/08/2023 0952   LEUKOCYTESUR NEGATIVE 10/08/2023 0952   Sepsis Labs: @LABRCNTIP (procalcitonin:4,lacticidven:4)  )No results found for this or any previous visit (from the past 240 hours).   Radiology Studies: No results found.    Scheduled Meds:  amiodarone   400 mg Oral Daily   apixaban   5 mg Oral BID   atorvastatin   10 mg Oral Daily   clonazePAM   0.5 mg Oral BID   gabapentin   300 mg Oral QHS   insulin  aspart  0-20 Units Subcutaneous TID WC   insulin  aspart  0-5 Units Subcutaneous QHS   insulin  aspart  6 Units Subcutaneous TID WC   insulin  glargine-yfgn  70 Units Subcutaneous Daily   ipratropium-albuterol   3 mL Nebulization BID   metoprolol  succinate  12.5 mg Oral Daily   potassium chloride  SA  40 mEq Oral Daily   sertraline   75 mg Oral q AM   sodium chloride  flush  3 mL Intravenous Q12H   tamsulosin   0.8 mg Oral Daily   timolol   1 drop  Both Eyes q morning   Continuous Infusions:   LOS: 5 days    Time spent:    Sigurd Pac, MD [facility]   12/24/2023, 12:00 PM

## 2023-12-25 ENCOUNTER — Inpatient Hospital Stay (HOSPITAL_COMMUNITY)

## 2023-12-25 DIAGNOSIS — J441 Chronic obstructive pulmonary disease with (acute) exacerbation: Secondary | ICD-10-CM | POA: Diagnosis not present

## 2023-12-25 DIAGNOSIS — I5043 Acute on chronic combined systolic (congestive) and diastolic (congestive) heart failure: Secondary | ICD-10-CM | POA: Diagnosis not present

## 2023-12-25 DIAGNOSIS — I472 Ventricular tachycardia, unspecified: Secondary | ICD-10-CM | POA: Diagnosis not present

## 2023-12-25 LAB — BASIC METABOLIC PANEL WITH GFR
Anion gap: 13 (ref 5–15)
BUN: 57 mg/dL — ABNORMAL HIGH (ref 6–20)
CO2: 29 mmol/L (ref 22–32)
Calcium: 9.3 mg/dL (ref 8.9–10.3)
Chloride: 96 mmol/L — ABNORMAL LOW (ref 98–111)
Creatinine, Ser: 2.57 mg/dL — ABNORMAL HIGH (ref 0.61–1.24)
GFR, Estimated: 28 mL/min — ABNORMAL LOW (ref >60)
Glucose, Bld: 156 mg/dL — ABNORMAL HIGH (ref 70–99)
Potassium: 3.3 mmol/L — ABNORMAL LOW (ref 3.5–5.1)
Sodium: 138 mmol/L (ref 135–145)

## 2023-12-25 LAB — GLUCOSE, CAPILLARY
Glucose-Capillary: 117 mg/dL — ABNORMAL HIGH (ref 70–99)
Glucose-Capillary: 239 mg/dL — ABNORMAL HIGH (ref 70–99)
Glucose-Capillary: 216 mg/dL — ABNORMAL HIGH (ref 70–99)
Glucose-Capillary: 248 mg/dL — ABNORMAL HIGH (ref 70–99)

## 2023-12-25 LAB — MAGNESIUM: Magnesium: 2.1 mg/dL (ref 1.7–2.4)

## 2023-12-25 LAB — TROPONIN I (HIGH SENSITIVITY): Troponin I (High Sensitivity): 13 ng/L (ref <18)

## 2023-12-25 MED ORDER — METHOCARBAMOL 500 MG PO TABS
500.0000 mg | ORAL_TABLET | Freq: Once | ORAL | Status: AC
  Administered 2023-12-25: 500 mg via ORAL
  Filled 2023-12-25: qty 1

## 2023-12-25 MED ORDER — LANTUS SOLOSTAR 100 UNIT/ML ~~LOC~~ SOPN: 60.0000 [IU] | PEN_INJECTOR | Freq: Every day | SUBCUTANEOUS | Status: DC

## 2023-12-25 MED ORDER — PANTOPRAZOLE SODIUM 40 MG PO TBEC: 40.0000 mg | DELAYED_RELEASE_TABLET | Freq: Every day | ORAL | 0 refills | Status: DC

## 2023-12-25 NOTE — Discharge Summary (Signed)
 Physician Discharge Summary  [name] FMW:[id number] DOB: November 23, 1965 DOA: 12/19/2023  PCP: Vicci Barnie NOVAK, MD  Admit date: 12/19/2023 Discharge date: 12/25/2023  Time spent:45 minutes  Recommendations for Outpatient Follow-up:  CHF clinic on 7/29, please check BMP in 1 week Referral sent to GI for esophageal dysphagia   Discharge Diagnoses:    Acute on chronic combined systolic and diastolic CHF (congestive heart failure) (HCC) CKD 4 Esophageal dysphagia   Type 2 diabetes mellitus with hyperlipidemia (HCC)   Diabetic polyneuropathy associated with type 2 diabetes mellitus (HCC)   VT (ventricular tachycardia) (HCC)   Acute renal failure (ARF) (HCC)   Acute exacerbation of CHF (congestive heart failure) (HCC)   Acute respiratory failure with hypoxia [facility])   Discharge Condition: Improved  Diet recommendation: Low sodium, heart healthy  Filed Weights   12/23/23 0300 12/24/23 0535 12/25/23 0441  Weight: 131.1 kg 131 kg 130.2 kg    History of present illness:  58/M w cardiac arrest status post AICD placement, NICM, atrial fibrillation on anticoagulation, T2DM, stage IIIb CKD  who presented to the ED on 12/19/2023 with worsening shortness of breath over the last couple of days. Work up in the ED included nonacute CXR, admitted with CHF exacerbation and?  COPD    Hospital Course:   Acute on chronic systolic CHF -Repeat echo with EF 40-45%, moderate LVH, grade 1 DD, normal RV -GDMT limited by CKD -History of poor compliance with diet, allegedly compliant with meds - Improved with diuresis, over 9 L negative, followed by cardiology - Transitioned to oral torsemide  40 Mg daily, continue Toprol  -GDMT limited by CKD - Has follow-up with CHF clinic on 7/29, needs BMP in 1 week   ?  Aspiration - History of aspiration like episode prior to admission - Chest x-ray unremarkable, no fever or leukocytosis, discontinued antibiotics    History of VT Continue amiodarone    OSA:  intolerant even of new CPAP machine at home  - CPAP nightly   AKI on stage IIIb CKD: SCr on admission is 3.2 from baseline 2.7. Improving with diuresis thus far.  - Continue trending, pt of Dr. Kelsey.  - Avoid nephrotoxins.    IDT2DM with steroid induced hyperglycemia:  - CBG's uncontrolled on prednisone , discontinuing steroids today, increase Semglee  - FU with endocrinology, Dr. Trixie.    HLD:  - Continue statin   Depression:  - Continue SSRI   BPH:  - Continue tamsulosin   Esophageal dysphagia and GERD - Takes Pepcid  at baseline, added Protonix , sent referral to GI for endoscopy  Discharge Exam: Vitals:   12/25/23 0808 12/25/23 1129  BP: 95/63 103/66  Pulse: 71 68  Resp:  20  Temp:  97.6 F (36.4 C)  SpO2:  94%   Gen: Awake, Alert, Oriented X 3,  HEENT: no JVD Lungs: Good air movement bilaterally, CTAB CVS: S1S2/RRR Abd: soft, Non tender, non distended, BS present Extremities: No edema Skin: no new rashes on exposed skin   Discharge Instructions    Allergies as of 12/25/2023       Reactions   Bee Venom Anaphylaxis   Trulicity  [dulaglutide ] Anaphylaxis, Diarrhea   Extra dose of Trulicity  caused cardiac arrest. Patient experience onset of diarrhea with using this medication - cleared after med stopped.        Medication List     STOP taking these medications    hydrALAZINE  25 MG tablet Commonly known as: APRESOLINE        TAKE these medications  acetaminophen  500 MG tablet Commonly known as: TYLENOL  Take 1,500 mg by mouth every 8 (eight) hours as needed for moderate pain (pain score 4-6).   amiodarone  200 MG tablet Commonly known as: PACERONE  Take 2 tablets (400 mg total) by mouth daily.   apixaban  5 MG Tabs tablet Commonly known as: Eliquis  Take 1 tablet (5 mg total) by mouth 2 (two) times daily.   atorvastatin  10 MG tablet Commonly known as: LIPITOR Take 1 tablet (10 mg total) by mouth daily.   benzonatate  200 MG  capsule Commonly known as: TESSALON  Take 200 mg by mouth 2 (two) times daily as needed for cough.   cetirizine  10 MG tablet Commonly known as: EQ Allergy Relief (Cetirizine ) Take 1 tablet (10 mg total) by mouth daily.   cholecalciferol  25 MCG (1000 UNIT) tablet Commonly known as: VITAMIN D3 Take 1,000 Units by mouth in the morning.   clonazePAM  0.5 MG tablet Commonly known as: KlonoPIN  Take 1 tablet (0.5 mg total) by mouth 2 (two) times daily as needed for anxiety. What changed: when to take this   Clotrimazole  1 % Oint Apply to the affected area twice a day What changed:  how much to take how to take this when to take this reasons to take this   colchicine  0.6 MG tablet 1/2 tab PO Q Mon/Wed/Fri What changed:  how much to take how to take this when to take this   EPINEPHrine  0.3 mg/0.3 mL Soaj injection Commonly known as: EPI-PEN Inject 0.3 mg into the muscle as needed. What changed: reasons to take this   famotidine  20 MG tablet Commonly known as: PEPCID  TAKE 1 TABLET BY MOUTH 1 TO 2 TIMES DAILY AS NEEDED   fluticasone  50 MCG/ACT nasal spray Commonly known as: FLONASE  Place 2 sprays into both nostrils daily.   gabapentin  300 MG capsule Commonly known as: NEURONTIN  Take 1 capsule (300 mg total) by mouth at bedtime.   Lantus  SoloStar 100 UNIT/ML Solostar Pen Generic drug: insulin  glargine Inject 60 Units into the skin daily. What changed: how much to take   loratadine  10 MG tablet Commonly known as: CLARITIN  Take 1 tablet (10 mg total) by mouth daily.   methocarbamol  500 MG tablet Commonly known as: ROBAXIN  Take 1 tablet (500 mg total) by mouth daily.   metolazone  5 MG tablet Commonly known as: ZAROXOLYN  Take 1 tablet (5 mg total) by mouth once a week. What changed: additional instructions   metoprolol  succinate 25 MG 24 hr tablet Commonly known as: TOPROL -XL Take 0.5 tablets (12.5 mg total) by mouth daily.   NovoLOG  FlexPen 100 UNIT/ML  FlexPen Generic drug: insulin  aspart Inject 10-20 Units into the skin 3 (three) times daily before meals. What changed: how much to take   nystatin  powder Commonly known as: MYCOSTATIN /NYSTOP  Apply 1 Application topically 3 (three) times daily.   pantoprazole  40 MG tablet Commonly known as: Protonix  Take 1 tablet (40 mg total) by mouth daily.   potassium chloride  SA 20 MEQ tablet Commonly known as: KLOR-CON  M Take 2 tablets (40 mEq total) by mouth daily.   sertraline  50 MG tablet Commonly known as: Zoloft  Take 1.5 tablets (75 mg total) by mouth in the morning.   Symbicort  160-4.5 MCG/ACT inhaler Generic drug: budesonide -formoterol  Inhale 2 puffs into the lungs daily.   Systane 0.4-0.3 % Soln Generic drug: Polyethyl Glycol-Propyl Glycol Place 1 drop into both eyes daily as needed (dry eyes).   tamsulosin  0.4 MG Caps capsule Commonly known as: FLOMAX  Take 2 capsules (  0.8 mg total) by mouth daily.   TechLite Plus Pen Needles 32G X 4 MM Misc Generic drug: Insulin  Pen Needle Use to inject insulin  into the skin 3 times per day.   timolol  0.5 % ophthalmic solution Commonly known as: TIMOPTIC  Place 1 drop into both eyes every morning.   torsemide  20 MG tablet Commonly known as: DEMADEX  Take 2 tablets (40 mg total) by mouth daily.       Allergies  Allergen Reactions   Bee Venom Anaphylaxis   Trulicity  [Dulaglutide ] Anaphylaxis and Diarrhea    Extra dose of Trulicity  caused cardiac arrest. Patient experience onset of diarrhea with using this medication - cleared after med stopped.      The results of significant diagnostics from this hospitalization (including imaging, microbiology, ancillary and laboratory) are listed below for reference.    Significant Diagnostic Studies: ECHOCARDIOGRAM COMPLETE Result Date: 12/22/2023    ECHOCARDIOGRAM REPORT   Patient Name:   MONTFORD BARG Date of Exam: 12/22/2023 Medical Rec #:  [id number]      Height:       73.0 in Accession  #:    [id number]     Weight:       288.4 lb Date of Birth:  1966-02-01      BSA:          2.514 m Patient Age:    58 years       BP:           116/57 mmHg Patient Gender: M              HR:           66 bpm. Exam Location:  Inpatient Procedure: 2D Echo, Cardiac Doppler, Color Doppler and Intracardiac            Opacification Agent (Both Spectral and Color Flow Doppler were            utilized during procedure). Indications:    CHF  History:        Patient has prior history of Echocardiogram examinations, most                 recent 09/03/2023. Defibrillator, COPD; Risk Factors:Diabetes,                 Hypertension and Dyslipidemia.  Sonographer:    Philomena Daring Referring Phys: [id number] LONNI LITTIE NANAS  Sonographer Comments: Patient is obese. IMPRESSIONS  1. Left ventricular ejection fraction, by estimation, is 40 to 45%. The left ventricle has mildly decreased function. The left ventricle demonstrates global hypokinesis. The left ventricular internal cavity size was mildly dilated. There is moderate concentric left ventricular hypertrophy. Left ventricular diastolic parameters are consistent with Grade I diastolic dysfunction (impaired relaxation).  2. Right ventricular systolic function is normal. The right ventricular size is normal. Tricuspid regurgitation signal is inadequate for assessing PA pressure.  3. Left atrial size was mildly dilated.  4. The mitral valve is normal in structure. No evidence of mitral valve regurgitation.  5. The aortic valve is tricuspid. Aortic valve regurgitation is not visualized. No aortic stenosis is present.  6. The inferior vena cava is normal in size with greater than 50% respiratory variability, suggesting right atrial pressure of 3 mmHg. Comparison(s): No significant change from prior study. Prior images reviewed side by side. FINDINGS  Left Ventricle: Left ventricular ejection fraction, by estimation, is 40 to 45%. The left ventricle has mildly decreased function. The  left ventricle demonstrates global hypokinesis. The left  ventricular internal cavity size was mildly dilated. There is  moderate concentric left ventricular hypertrophy. Left ventricular diastolic parameters are consistent with Grade I diastolic dysfunction (impaired relaxation). Right Ventricle: The right ventricular size is normal. Right vetricular wall thickness was not well visualized. Right ventricular systolic function is normal. Tricuspid regurgitation signal is inadequate for assessing PA pressure. Left Atrium: Left atrial size was mildly dilated. Right Atrium: Right atrial size was not well visualized. Pericardium: There is no evidence of pericardial effusion. Mitral Valve: The mitral valve is normal in structure. No evidence of mitral valve regurgitation. Tricuspid Valve: The tricuspid valve is not well visualized. Tricuspid valve regurgitation is not demonstrated. Aortic Valve: The aortic valve is tricuspid. Aortic valve regurgitation is not visualized. No aortic stenosis is present. Pulmonic Valve: The pulmonic valve was grossly normal. Pulmonic valve regurgitation is mild. No evidence of pulmonic stenosis. Aorta: The aortic root and ascending aorta are structurally normal, with no evidence of dilitation. Venous: The inferior vena cava is normal in size with greater than 50% respiratory variability, suggesting right atrial pressure of 3 mmHg. IAS/Shunts: The interatrial septum was not well visualized. Additional Comments: A device lead is visualized.  LEFT VENTRICLE PLAX 2D LVIDd:         5.90 cm   Diastology LVIDs:         4.20 cm   LV e' medial:   3.81 cm/s LV PW:         1.30 cm   LV E/e' medial: 14.3 LV IVS:        1.50 cm LVOT diam:     2.60 cm LV SV:         99 LV SV Index:   39 LVOT Area:     5.31 cm  IVC IVC diam: 1.60 cm LEFT ATRIUM             Index        RIGHT ATRIUM           Index LA diam:        3.60 cm 1.43 cm/m   RA Area:     14.80 cm LA Vol (A2C):   41.7 ml 16.59 ml/m  RA Volume:    39.00 ml  15.52 ml/m LA Vol (A4C):   41.6 ml 16.55 ml/m LA Biplane Vol: 42.3 ml 16.83 ml/m  AORTIC VALVE LVOT Vmax:   86.50 cm/s LVOT Vmean:  58.400 cm/s LVOT VTI:    0.186 m  AORTA Ao Root diam: 3.70 cm Ao Asc diam:  3.40 cm MITRAL VALVE MV Area (PHT): 3.37 cm    SHUNTS MV Decel Time: 225 msec    Systemic VTI:  0.19 m MV E velocity: 54.50 cm/s  Systemic Diam: 2.60 cm MV A velocity: 97.90 cm/s MV E/A ratio:  0.56 Mihai Croitoru MD Electronically signed by Jerel Balding MD Signature Date/Time: 12/22/2023/4:07:17 PM    Final    US  EKG SITE RITE Result Date: 12/20/2023 If Site Rite image not attached, placement could not be confirmed due to current cardiac rhythm.  DG Chest Port 1 View Result Date: 12/19/2023 CLINICAL DATA:  Chest pain EXAM: PORTABLE CHEST 1 VIEW COMPARISON:  Chest x-ray 10/10/2023 FINDINGS: The cardiac silhouette is enlarged, unchanged. Left lung base has been excluded from the exam. Left-sided pacemaker is unchanged in position. The lungs are clear. There is no pneumothorax or acute fracture. IMPRESSION: 1. No active disease. 2. Cardiomegaly. Electronically Signed   By: Greig Pique M.D.   On:  12/19/2023 18:44    Microbiology: No results found for this or any previous visit (from the past 240 hours).   Labs: Basic Metabolic Panel: Recent Labs  Lab 12/21/23 1154 12/22/23 0250 12/23/23 0227 12/24/23 0230 12/25/23 0204  NA 134* 135 138 137 138  K 3.5 3.7 3.4* 3.8 3.3*  CL 90* 94* 96* 99 96*  CO2 29 26 28 24 29   GLUCOSE 258* 328* 249* 181* 156*  BUN 62* 64* 65* 56* 57*  CREATININE 3.00* 2.84* 2.66* 2.49* 2.57*  CALCIUM  9.6 9.6 9.9 9.6 9.3  MG 2.0 2.2  --  2.3 2.1  PHOS  --   --  4.1  --   --    Liver Function Tests: Recent Labs  Lab 12/23/23 0227  ALBUMIN 3.7   No results for input(s): LIPASE, AMYLASE in the last 168 hours. No results for input(s): AMMONIA in the last 168 hours. CBC: Recent Labs  Lab 12/19/23 1756  WBC 8.4  HGB 14.1  HCT 42.5  MCV  92.0  PLT 329   Cardiac Enzymes: No results for input(s): CKTOTAL, CKMB, CKMBINDEX, TROPONINI in the last 168 hours. BNP: BNP (last 3 results) Recent Labs    10/07/23 2201 10/23/23 1105 12/19/23 1756  BNP 18.6 25.5 27.0    ProBNP (last 3 results) No results for input(s): PROBNP in the last 8760 hours.  CBG: Recent Labs  Lab 12/24/23 1150 12/24/23 1715 12/24/23 2108 12/25/23 0610 12/25/23 1127  GLUCAP 188* 179* 238* 117* 239*       Signed:  Sigurd Pac MD.  [facility] 12/25/2023, 12:21 PM

## 2023-12-25 NOTE — Plan of Care (Signed)
  Problem: Nutritional: Goal: Maintenance of adequate nutrition will improve Outcome: Not Progressing   Problem: Education: Goal: Knowledge of General Education information will improve Description: Including pain rating scale, medication(s)/side effects and non-pharmacologic comfort measures Outcome: Not Progressing   Problem: Health Behavior/Discharge Planning: Goal: Ability to manage health-related needs will improve Outcome: Not Progressing

## 2023-12-25 NOTE — Progress Notes (Signed)
 Mobility Specialist Progress Note:    12/25/23 0950  Mobility  Activity Ambulated with assistance in hallway  Level of Assistance Standby assist, set-up cues, supervision of patient - no hands on  Assistive Device None  Distance Ambulated (ft) 300 ft  Activity Response Tolerated well  Mobility Referral Yes  Mobility visit 1 Mobility  Mobility Specialist Start Time (ACUTE ONLY) 0950  Mobility Specialist Stop Time (ACUTE ONLY) 1003  Mobility Specialist Time Calculation (min) (ACUTE ONLY) 13 min   Pt was agreeable to session. Somewhat of a right sided limp walking w/ the railing on the wall. C/o of fatigue while walking but otherwise fine w/ no SOB. Left pt in bed w/ all needs met and vitals stable.  Venetia Keel Mobility Specialist Please Neurosurgeon or Rehab Office at [phone]

## 2023-12-25 NOTE — Progress Notes (Signed)
  Progress Note  Patient Name: [name] Date of Encounter: 12/25/2023 [facility] Cardiologist: Redell Shallow, MD   Interval Summary   BP [phone].  Net negative 1.5L yesterday, negative 9.2L on admission.  Renal function stable (2.76 > 2.49 >2.57).  Reports dyspnea improved  Vital Signs Vitals:   12/25/23 0121 12/25/23 0441 12/25/23 0728 12/25/23 0808  BP: 106/75 94/63 95/63  95/63  Pulse: 64 68 76 71  Resp: 18 18 18    Temp: 98.6 F (37 C) 97.8 F (36.6 C) 97.6 F (36.4 C)   TempSrc: Oral Oral Oral   SpO2: 96% 95% 94%   Weight:  130.2 kg    Height:       Intake/Output Summary (Last 24 hours) at 12/25/2023 0837 Last data filed at 12/25/2023 0442 Gross per 24 hour  Intake 560 ml  Output 2025 ml  Net -1465 ml      12/25/2023    4:41 AM 12/24/2023    5:35 AM 12/23/2023    3:00 AM  Last 3 Weights  Weight (lbs) 287 lb 0.6 oz 288 lb 12.8 oz 289 lb 0.4 oz  Weight (kg) 130.2 kg 131 kg 131.1 kg     Telemetry/ECG  Sinus rhythm, HR 60s - Personally Reviewed  Physical Exam  GEN: No acute distress.   Neck: difficult to assess JVD Cardiac: RRR, no murmurs, rubs, or gallops.  Respiratory: CTAB GI: Soft, nontender, non-distended  MS: No edema  Assessment & Plan  58 y.o. male with a hx of combined CHF s/p Medtronic ICD, nonischemic cardiomyopathy, type 2 diabetes, CKD stage IIIb, prior DVT, Prior VT, and OSA who is being seen for CHF exacerbation    Acute on chronic combined CHF S/p Medtronic ICD Follows in our AHF clinic  LVEF 20-25% in 11/2022, most recent LVEF 40-45% in 08/2023  Admitted May 2025 for AKI, weighed 279 lb at discharge Seen 10/23/2023 in clinic, weighed 290 lb, appeared volume overloaded, torsemide  was increased to 40 mg M/W/F, 20 mg other days, metolazone  2.5 mg on Mondays, continued on Toprol  and hydralazine   Not on ACE/ARB/ARNI or MRA with renal function, not on SGLT2i with history of groin cellulitis  Started on IV Lasix   Net - 9.2 L this  admission Updated echo showed: LVEF 40-45%, moderate LVH, G1DD, normal RV, dialted LA, normal IVC -- no significant changes from prior study 08/2023 Has diuresed well, renal function now back to baseline.  Volume status difficult to assess on exam but suspect euvolemic, echo shows small/collapsible IVC.  Transitioned to PO torsemide  Continue Toprol  12.5 mg daily  Replete K>4, Mag>2   History of VT Continue PTA amiodarone  400 mg daily    Per primary Type 2 diabetes CKD stage 3b COPD History of DVT  [facility] will sign off.   Medication Recommendations: Amiodarone  400 mg daily, Eliquis  5 mg twice daily, torsemide  40 mg daily Other recommendations (labs, testing, etc): BMET in 1 week Follow up as an outpatient: Follow-up scheduled on 7/29    For questions or updates, please contact [facility] Please consult [email or web address] for contact info under       Signed, Lonni LITTIE Nanas, MD

## 2023-12-25 NOTE — Evaluation (Signed)
 Clinical/Bedside Swallow Evaluation Patient Details  Name: [name] MRN: [id number] Date of Birth: 1966-05-29  Today's Date: 12/25/2023 Time: SLP Start Time (ACUTE ONLY): 0810 SLP Stop Time (ACUTE ONLY): 0825 SLP Time Calculation (min) (ACUTE ONLY): 15 min  Past Medical History:  Past Medical History:  Diagnosis Date   AICD (automatic cardioverter/defibrillator) present    Chronic bronchitis (HCC)    get it most q yr (12/23/2013)   Chronic systolic (congestive) heart failure (HCC)    Fracture of left humerus    a. 07/2013.   GERD (gastroesophageal reflux disease)    not at present time   Heart murmur    born w/it    History of renal calculi    Hypertension    Kidney stones    NICM (nonischemic cardiomyopathy) (HCC)    a. 07/2013 Echo: EF 20-25%, diff HK, Gr2 DD, mild MR, mod dil LA/RA. 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    Shockable heart rhythm detected by automated external defibrillator    Syncope    Type II diabetes mellitus (HCC) 2006   Past Surgical History:  Past Surgical History:  Procedure Laterality Date   CARDIAC CATHETERIZATION  08/10/13   CHOLECYSTECTOMY  ~ 2010   IMPLANTABLE CARDIOVERTER DEFIBRILLATOR IMPLANT  12/23/2013   STJ Fortify ICD implanted by Dr Kelsie for cardiomyopathy and syncope   IMPLANTABLE CARDIOVERTER DEFIBRILLATOR IMPLANT N/A 12/23/2013   Procedure: IMPLANTABLE CARDIOVERTER DEFIBRILLATOR IMPLANT;  Surgeon: Lynwood JONETTA Kelsie, MD;  Location: [facility] CATH LAB;  Service: Cardiovascular;  Laterality: N/A;   INGUINAL HERNIA REPAIR Left 03/01/2018   Procedure: OPEN REPAIR OF LEFT INGUINAL HERNIA WITH MESH;  Surgeon: Tanda Locus, MD;  Location: [facility] ORS;  Service: General;  Laterality: Left;   LEAD REVISION/REPAIR N/A 05/15/2022   Procedure: LEAD REVISION/REPAIR;  Surgeon: [name] Ole DASEN, MD;  Location: [facility] INVASIVE CV LAB;  Service: Cardiovascular;  Laterality: N/A;   LEFT  HEART CATHETERIZATION WITH CORONARY ANGIOGRAM N/A 08/10/2013   Procedure: LEFT HEART CATHETERIZATION WITH CORONARY ANGIOGRAM;  Surgeon: Candyce GORMAN Reek, MD;  Location: [facility] CATH LAB;  Service: Cardiovascular;  Laterality: N/A;   ORIF HUMERUS FRACTURE Left 08/12/2013   Procedure: OPEN REDUCTION INTERNAL FIXATION (ORIF) HUMERAL SHAFT FRACTURE;  Surgeon: Jerona LULLA Sage, MD;  Location: [facility] OR;  Service: Orthopedics;  Laterality: Left;  Open Reduction Internal Fixation Left Humerus   RIGHT/LEFT HEART CATH AND CORONARY ANGIOGRAPHY N/A 05/12/2022   Procedure: RIGHT/LEFT HEART CATH AND CORONARY ANGIOGRAPHY;  Surgeon: Gardenia Led, DO;  Location: [facility] INVASIVE CV LAB;  Service: Cardiovascular;  Laterality: N/A;   URETEROSCOPY     laser for kidney stones   HPI:  Pt 58 yo male admitted to [facility] with h/o VT arrest s/p ICD placement, COPD, CKD, CHF stage IIIB - adm with wheezing/dyspnea and weight gain of 10 pounds.  Pt has h/o symptoms that are consistent with GER and takes 3 famotidine  tablets daily PTA for the last two months.  Reports choking on water approx 1.5 weeks ago that exacerbated his COPD.  Swallow eval ordered.  No signficant neuro history reported.    Assessment / Plan / Recommendation  Clinical Impression  Pt seen after his breakfast *full meal consumed*.  No focal CN deficits noted and pt easily passed 3 ounce Yale water challenge.   Adequate oropharyngeal swallow based on clinical evaluation with solids, puree and thin liquids. Pt's report of choking with water *on  occasion*, sensing food and liquid sticking in esophagus and belching/refluxing with occasional regurgitation/vomiting - appears consistent with reflux/esophageal/GI related dysphagia. He reports taking 3 Famotidines every day for the last 2 months - He reports issues with vomiting after belching/refluxing.  Denies any issues with swallowing 20 pills at once -thus episodic presention may be consistent with dysmotlity, reflux and/or  gastric emptying issue.  Educated pt to concerns - and advised he follow up with PCP. He does report behavior modification *not drinking tea, soda* and not eating later helped with his symptoms.  Defer to MD for management as no oropharyngeal dysphagia noted.  Educated pt to general reflux precautions -such as avoiding tobacco, 2nd hand smoke, ETOH, and mint.  Thanks for this consult.  SLP Visit Diagnosis: Dysphagia, unspecified (R13.10)    Aspiration Risk  Mild aspiration risk    Diet Recommendation Regular;Thin liquid    Liquid Administration via: Cup;Straw Medication Administration: Whole meds with liquid Supervision: Patient able to self feed Compensations: Slow rate;Small sips/bites Postural Changes: Seated upright at 90 degrees;Remain upright for at least 30 minutes after po intake    Other  Recommendations Oral Care Recommendations: Oral care BID     Assistance Recommended at Discharge  N/a  Functional Status Assessment Patient has not had a recent decline in their functional status  Frequency and Duration     N/a       Prognosis   N/a     Swallow Study   General Date of Onset: 12/25/23 HPI: Pt 58 yo male admitted to [facility] with h/o VT arrest s/p ICD placement, COPD, CKD, CHF stage IIIB - adm with wheezing/dyspnea and weight gain of 10 pounds.  Pt has h/o symptoms that are consistent with GER and takes 3 famotidine  tablets daily PTA for the last two months.  Reports choking on water approx 1.5 weeks ago that exacerbated his COPD.  Swallow eval ordered.  No signficant neuro history reported. Diet Prior to this Study: Regular;Thin liquids (Level 0) Temperature Spikes Noted: No Respiratory Status: Room air Behavior/Cognition: Alert;Cooperative;Pleasant mood Oral Cavity Assessment: Within Functional Limits Oral Care Completed by SLP: No Oral Cavity - Dentition: Adequate natural dentition Vision: Functional for self-feeding Self-Feeding Abilities: Able to feed self Patient  Positioning: Upright in bed Baseline Vocal Quality: Normal Volitional Cough: Strong Volitional Swallow: Able to elicit    Oral/Motor/Sensory Function Overall Oral Motor/Sensory Function: Within functional limits   Ice Chips Ice chips: Not tested   Thin Liquid Thin Liquid: Within functional limits Presentation: Self Fed;Cup    Nectar Thick Nectar Thick Liquid: Not tested   Honey Thick Honey Thick Liquid: Not tested   Puree Puree: Within functional limits Presentation: Self Fed;Spoon   Solid     Solid: Within functional limits Presentation: Self Fed;Spoon      Nicolas Emmie Caldron 12/25/2023,9:00 AM  Madelin POUR, MS [facility] SLP Acute Rehab Services Office [phone]

## 2023-12-26 DIAGNOSIS — J441 Chronic obstructive pulmonary disease with (acute) exacerbation: Secondary | ICD-10-CM | POA: Diagnosis not present

## 2023-12-26 LAB — BASIC METABOLIC PANEL WITH GFR
Anion gap: 10 (ref 5–15)
BUN: 57 mg/dL — ABNORMAL HIGH (ref 6–20)
CO2: 29 mmol/L (ref 22–32)
Calcium: 9.1 mg/dL (ref 8.9–10.3)
Chloride: 99 mmol/L (ref 98–111)
Creatinine, Ser: 2.61 mg/dL — ABNORMAL HIGH (ref 0.61–1.24)
GFR, Estimated: 27 mL/min — ABNORMAL LOW (ref >60)
Glucose, Bld: 118 mg/dL — ABNORMAL HIGH (ref 70–99)
Potassium: 3.3 mmol/L — ABNORMAL LOW (ref 3.5–5.1)
Sodium: 138 mmol/L (ref 135–145)

## 2023-12-26 LAB — GLUCOSE, CAPILLARY
Glucose-Capillary: 114 mg/dL — ABNORMAL HIGH (ref 70–99)
Glucose-Capillary: 199 mg/dL — ABNORMAL HIGH (ref 70–99)
Glucose-Capillary: 190 mg/dL — ABNORMAL HIGH (ref 70–99)
Glucose-Capillary: 206 mg/dL — ABNORMAL HIGH (ref 70–99)

## 2023-12-26 LAB — TROPONIN I (HIGH SENSITIVITY): Troponin I (High Sensitivity): 27 ng/L — ABNORMAL HIGH (ref <18)

## 2023-12-26 MED ORDER — HYDROMORPHONE HCL 1 MG/ML IJ SOLN
1.0000 mg | INTRAMUSCULAR | Status: DC | PRN
  Administered 2023-12-26 – 2024-01-01 (×5): 1 mg via INTRAVENOUS
  Filled 2023-12-26 (×5): qty 1

## 2023-12-26 MED ORDER — OXYCODONE HCL 5 MG PO TABS
5.0000 mg | ORAL_TABLET | ORAL | Status: DC | PRN
  Administered 2023-12-26 – 2024-01-05 (×31): 5 mg via ORAL
  Filled 2023-12-26 (×32): qty 1

## 2023-12-26 MED ORDER — METHOCARBAMOL 500 MG PO TABS
500.0000 mg | ORAL_TABLET | Freq: Three times a day (TID) | ORAL | Status: DC | PRN
  Administered 2023-12-28 – 2023-12-29 (×2): 500 mg via ORAL
  Filled 2023-12-26 (×3): qty 1

## 2023-12-26 MED ORDER — SUCRALFATE 1 GM/10ML PO SUSP
1.0000 g | Freq: Three times a day (TID) | ORAL | Status: DC
  Administered 2023-12-26 – 2024-01-05 (×41): 1 g via ORAL
  Filled 2023-12-26 (×41): qty 10

## 2023-12-26 NOTE — Progress Notes (Signed)
   12/26/23 0018  BiPAP/CPAP/SIPAP  $ Non-Invasive Home Ventilator  Subsequent  BiPAP/CPAP/SIPAP Pt Type Adult  BiPAP/CPAP/SIPAP Resmed (home unit, pt states that he will put it on himself, machine within reach and filled with water)  Mask Type Full face mask  Patient Home Machine Yes  Safety Check Completed by RT for Home Unit Yes, no issues noted  Patient Home Mask Yes  Patient Home Tubing Yes  BiPAP/CPAP /SiPAP Vitals  Pulse Rate 73  SpO2 98 %  MEWS Score/Color  MEWS Score 0  MEWS Score Color Green

## 2023-12-26 NOTE — Progress Notes (Signed)
 PROGRESS NOTE    [name]  FMW:[id number] DOB: [birth year] DOA: 12/19/2023 PCP: Vicci Barnie NOVAK, MD   58/M w cardiac arrest status post AICD placement, NICM, atrial fibrillation on anticoagulation, T2DM, stage IIIb CKD  who presented to the ED on 12/19/2023 with worsening shortness of breath over the last couple of days. Work up in the ED included nonacute CXR, admitted with CHF exacerbation  - Cards following, improved with diuresis - 7/18, discharge planned however subsequently reported ongoing left-sided chest pain since previous night, x-ray and troponins unremarkable   Subjective: - Has worsening left-sided chest pain today, worse with inspiration  Assessment and Plan:  Acute on chronic systolic CHF -Repeat echo with EF 40-45%, moderate LVH, grade 1 DD, normal RV -GDMT limited by CKD -Improving with diuresis, 8 L negative, - Cards following, transitioning to oral torsemide  today -GDMT limited by CKD -Cards following, continue Toprol   Left-sided pleuritic chest pain - New onset, etiology is unclear, chest x-ray and troponins unremarkable - Remains on PPI, add Carafate  - Also check VQ scan, despite anticoagulation with apixaban  - Will consider noncontrast CT if VQ scan is unremarkable  Esophageal dysphagia - History of aspiration like episode prior to admission - Chest x-ray unremarkable, no fever or leukocytosis, discontinued antibiotics  - Continue PPI, refer to GI  Paroxysmal atrial fibrillation - Currently in sinus rhythm, continue amiodarone , Eliquis   History of VT Continue amiodarone    OSA: intolerant even of new CPAP machine at home  - CPAP nightly   AKI on stage IIIb CKD: SCr on admission is 3.2 from baseline 2.7. Improving with diuresis thus far.  - Continue trending, pt of Dr. Kelsey.  - Avoid nephrotoxins.    IDT2DM with steroid induced hyperglycemia:  - CBG's uncontrolled on prednisone , discontinuing steroids today, increase Semglee  - FU with  endocrinology, Dr. Trixie.    HLD:  - Continue statin   Depression:  - Continue SSRI   BPH:  - Continue tamsulosin     DVT prophylaxis: Apixaban  Code Status: DNR Family Communication: brother at bedside Disposition Plan: Home pending improvement in symptoms  Consultants:    Procedures:   Antimicrobials:    Objective: Vitals:   12/26/23 0425 12/26/23 0500 12/26/23 0843 12/26/23 0851  BP: 109/75   120/89  Pulse: 66 65  67  Resp: 19     Temp: 98 F (36.7 C)  98 F (36.7 C)   TempSrc: Axillary  Oral   SpO2: 98% 93% 94% 93%  Weight:  128 kg    Height:        Intake/Output Summary (Last 24 hours) at 12/26/2023 1046 Last data filed at 12/26/2023 0511 Gross per 24 hour  Intake 472 ml  Output 1250 ml  Net -778 ml   Filed Weights   12/24/23 0535 12/25/23 0441 12/26/23 0500  Weight: 131 kg 130.2 kg 128 kg    Examination:  General exam: He is chronically ill, uncomfortable appearing, laying on the side of the bed HEENT: Neck obese unable to assess JVD Respiratory system: Decreased breath sounds at the bases Cardiovascular system: S1 & S2 heard, RRR.  Abd: nondistended, soft and nontender.Normal bowel sounds heard. Central nervous system: Alert and oriented. No focal neurological deficits. Extremities: no edema Skin: No rashes Psychiatry: Flat affect, uncomfortable    Data Reviewed:   CBC: Recent Labs  Lab 12/19/23 1756  WBC 8.4  HGB 14.1  HCT 42.5  MCV 92.0  PLT 329   Basic Metabolic Panel: Recent Labs  Lab  12/21/23 1154 12/22/23 0250 12/23/23 0227 12/24/23 0230 12/25/23 0204 12/26/23 0228  NA 134* 135 138 137 138 138  K 3.5 3.7 3.4* 3.8 3.3* 3.3*  CL 90* 94* 96* 99 96* 99  CO2 29 26 28 24 29 29   GLUCOSE 258* 328* 249* 181* 156* 118*  BUN 62* 64* 65* 56* 57* 57*  CREATININE 3.00* 2.84* 2.66* 2.49* 2.57* 2.61*  CALCIUM  9.6 9.6 9.9 9.6 9.3 9.1  MG 2.0 2.2  --  2.3 2.1  --   PHOS  --   --  4.1  --   --   --    GFR: Estimated  Creatinine Clearance: 42.7 mL/min (A) (by C-G formula based on SCr of 2.61 mg/dL (H)). Liver Function Tests: Recent Labs  Lab 12/23/23 0227  ALBUMIN  3.7   No results for input(s): LIPASE, AMYLASE in the last 168 hours. No results for input(s): AMMONIA in the last 168 hours. Coagulation Profile: No results for input(s): INR, PROTIME in the last 168 hours. Cardiac Enzymes: No results for input(s): CKTOTAL, CKMB, CKMBINDEX, TROPONINI in the last 168 hours. BNP (last 3 results) No results for input(s): PROBNP in the last 8760 hours. HbA1C: No results for input(s): HGBA1C in the last 72 hours. CBG: Recent Labs  Lab 12/25/23 0610 12/25/23 1127 12/25/23 1550 12/25/23 2144 12/26/23 0615  GLUCAP 117* 239* 216* 248* 114*   Lipid Profile: No results for input(s): CHOL, HDL, LDLCALC, TRIG, CHOLHDL, LDLDIRECT in the last 72 hours. Thyroid  Function Tests: No results for input(s): TSH, T4TOTAL, FREET4, T3FREE, THYROIDAB in the last 72 hours. Anemia Panel: No results for input(s): VITAMINB12, FOLATE, FERRITIN, TIBC, IRON, RETICCTPCT in the last 72 hours. Urine analysis:    Component Value Date/Time   COLORURINE STRAW (A) 10/08/2023 0952   APPEARANCEUR CLEAR 10/08/2023 0952   APPEARANCEUR Clear 07/17/2020 1655   LABSPEC 1.009 10/08/2023 0952   PHURINE 5.0 10/08/2023 0952   GLUCOSEU 50 (A) 10/08/2023 0952   HGBUR NEGATIVE 10/08/2023 0952   BILIRUBINUR NEGATIVE 10/08/2023 0952   BILIRUBINUR negative 01/30/2022 1705   BILIRUBINUR Negative 07/17/2020 1655   KETONESUR NEGATIVE 10/08/2023 0952   PROTEINUR NEGATIVE 10/08/2023 0952   UROBILINOGEN 0.2 01/30/2022 1705   UROBILINOGEN 1.0 06/20/2013 1032   NITRITE NEGATIVE 10/08/2023 0952   LEUKOCYTESUR NEGATIVE 10/08/2023 0952   Sepsis Labs: @LABRCNTIP (procalcitonin:4,lacticidven:4)  )No results found for this or any previous visit (from the past 240 hours).   Radiology  Studies: DG CHEST PORT 1 VIEW Result Date: 12/25/2023 CLINICAL DATA:  Chest pain EXAM: PORTABLE CHEST 1 VIEW COMPARISON:  X-ray 12/19/2023. FINDINGS: Underinflation. Enlarged cardiopericardial silhouette. Tortuous ectatic aorta. No pneumothorax or effusion. No edema. Bronchovascular crowding. Left upper chest battery pack with defibrillator leads along the right side of the heart. Overlapping cardiac leads. Film is under penetrated. IMPRESSION: Underinflation. Enlarged cardiac silhouette with some prominence of central vasculature. No edema. Defibrillator. Electronically Signed   By: Ranell Bring M.D.   On: 12/25/2023 15:34      Scheduled Meds:  amiodarone   400 mg Oral Daily   apixaban   5 mg Oral BID   atorvastatin   10 mg Oral Daily   clonazePAM   0.5 mg Oral BID   gabapentin   300 mg Oral QHS   insulin  aspart  0-20 Units Subcutaneous TID WC   insulin  aspart  0-5 Units Subcutaneous QHS   insulin  aspart  6 Units Subcutaneous TID WC   insulin  glargine-yfgn  70 Units Subcutaneous Daily   metoprolol  succinate  12.5 mg Oral  Daily   potassium chloride  SA  40 mEq Oral Daily   sertraline   75 mg Oral q AM   sodium chloride  flush  3 mL Intravenous Q12H   sucralfate   1 g Oral TID WC & HS   tamsulosin   0.8 mg Oral Daily   timolol   1 drop Both Eyes q morning   torsemide   40 mg Oral Daily   Continuous Infusions:   LOS: 7 days    Time spent:    Sigurd Pac, MD [facility]   12/26/2023, 10:46 AM

## 2023-12-26 NOTE — Progress Notes (Signed)
 Patient was complaining of chest pain this morning. Oxycodone   and Dilaudid  ordered for chest pain control. Pt states that Dilaudid  relieves chest pain better than Oxycodone . NM pulmonary perfusion study was ordered.

## 2023-12-27 ENCOUNTER — Other Ambulatory Visit: Payer: Self-pay

## 2023-12-27 ENCOUNTER — Inpatient Hospital Stay (HOSPITAL_COMMUNITY)

## 2023-12-27 DIAGNOSIS — J441 Chronic obstructive pulmonary disease with (acute) exacerbation: Secondary | ICD-10-CM | POA: Diagnosis not present

## 2023-12-27 LAB — COMPREHENSIVE METABOLIC PANEL WITH GFR
ALT: 47 U/L — ABNORMAL HIGH (ref 0–44)
AST: 37 U/L (ref 15–41)
Albumin: 3.9 g/dL (ref 3.5–5.0)
Alkaline Phosphatase: 81 U/L (ref 38–126)
Anion gap: 14 (ref 5–15)
BUN: 54 mg/dL — ABNORMAL HIGH (ref 6–20)
CO2: 25 mmol/L (ref 22–32)
Calcium: 9.2 mg/dL (ref 8.9–10.3)
Chloride: 97 mmol/L — ABNORMAL LOW (ref 98–111)
Creatinine, Ser: 2.69 mg/dL — ABNORMAL HIGH (ref 0.61–1.24)
GFR, Estimated: 26 mL/min — ABNORMAL LOW (ref >60)
Glucose, Bld: 184 mg/dL — ABNORMAL HIGH (ref 70–99)
Potassium: 3.7 mmol/L (ref 3.5–5.1)
Sodium: 136 mmol/L (ref 135–145)
Total Bilirubin: 0.7 mg/dL (ref 0.0–1.2)
Total Protein: 8.1 g/dL (ref 6.5–8.1)

## 2023-12-27 LAB — CBC
HCT: 43.3 % (ref 39.0–52.0)
Hemoglobin: 14.3 g/dL (ref 13.0–17.0)
MCH: 30.8 pg (ref 26.0–34.0)
MCHC: 33 g/dL (ref 30.0–36.0)
MCV: 93.3 fL (ref 80.0–100.0)
Platelets: 274 K/uL (ref 150–400)
RBC: 4.64 MIL/uL (ref 4.22–5.81)
RDW: 15.1 % (ref 11.5–15.5)
WBC: 10 K/uL (ref 4.0–10.5)
nRBC: 0 % (ref 0.0–0.2)

## 2023-12-27 LAB — GLUCOSE, CAPILLARY
Glucose-Capillary: 170 mg/dL — ABNORMAL HIGH (ref 70–99)
Glucose-Capillary: 200 mg/dL — ABNORMAL HIGH (ref 70–99)
Glucose-Capillary: 201 mg/dL — ABNORMAL HIGH (ref 70–99)
Glucose-Capillary: 273 mg/dL — ABNORMAL HIGH (ref 70–99)

## 2023-12-27 LAB — APTT
aPTT: 30 s (ref 24–36)
aPTT: 57 s — ABNORMAL HIGH (ref 24–36)

## 2023-12-27 LAB — HEPARIN LEVEL (UNFRACTIONATED): Heparin Unfractionated: >1.1 [IU]/mL — ABNORMAL HIGH (ref 0.30–0.70)

## 2023-12-27 MED ORDER — TECHNETIUM TO 99M ALBUMIN AGGREGATED
4.4000 | Freq: Once | INTRAVENOUS | Status: AC | PRN
  Administered 2023-12-27: 4.4 via INTRAVENOUS

## 2023-12-27 MED ORDER — HEPARIN (PORCINE) 25000 UT/250ML-% IV SOLN
1950.0000 [IU]/h | INTRAVENOUS | Status: DC
  Administered 2023-12-27: 1750 [IU]/h via INTRAVENOUS
  Administered 2023-12-28: 1950 [IU]/h via INTRAVENOUS
  Filled 2023-12-27 (×2): qty 250

## 2023-12-27 NOTE — Progress Notes (Signed)
   12/27/23 2241  BiPAP/CPAP/SIPAP  Reason BIPAP/CPAP not in use Non-compliant (Pt has home unit at bedside. He states he will not be wearing CPAP.)

## 2023-12-27 NOTE — Plan of Care (Signed)
   Problem: Clinical Measurements: Goal: Will remain free from infection Outcome: Progressing   Problem: Clinical Measurements: Goal: Diagnostic test results will improve Outcome: Progressing   Problem: Clinical Measurements: Goal: Respiratory complications will improve Outcome: Progressing

## 2023-12-27 NOTE — Progress Notes (Signed)
 PHARMACY - ANTICOAGULATION CONSULT NOTE  Pharmacy Consult for IV heparin  Indication: Suspected PE  Allergies  Allergen Reactions   Bee Venom Anaphylaxis   Trulicity  [Dulaglutide ] Anaphylaxis and Diarrhea    Extra dose of Trulicity  caused cardiac arrest. Patient experience onset of diarrhea with using this medication - cleared after med stopped.    Patient Measurements: Height: 6' 0.99 (185.4 cm) Weight: 129.4 kg (285 lb 4.4 oz) IBW/kg (Calculated) : 79.88 HEPARIN  DW (KG): 108.7  Vital Signs: Temp: 98.7 F (37.1 C) (07/20 1955) Temp Source: Oral (07/20 1955) BP: 121/66 (07/20 1955) Pulse Rate: 71 (07/20 1955)  Labs: Recent Labs    12/25/23 0204 12/25/23 0930 12/26/23 0228 12/26/23 1411 12/27/23 0851 12/27/23 1208 12/27/23 2048  HGB  --   --   --   --  14.3  --   --   HCT  --   --   --   --  43.3  --   --   PLT  --   --   --   --  274  --   --   APTT  --   --   --   --   --  30 57*  HEPARINUNFRC  --   --   --   --   --  >1.10*  --   CREATININE 2.57*  --  2.61*  --  2.69*  --   --   TROPONINIHS  --  13  --  27*  --   --   --     Estimated Creatinine Clearance: 41.7 mL/min (A) (by C-G formula based on SCr of 2.69 mg/dL (H)).   Medical History: Past Medical History:  Diagnosis Date   AICD (automatic cardioverter/defibrillator) present    Chronic bronchitis (HCC)    get it most q yr (12/23/2013)   Chronic systolic (congestive) heart failure (HCC)    Fracture of left humerus    a. 07/2013.   GERD (gastroesophageal reflux disease)    not at present time   Heart murmur    born w/it    History of renal calculi    Hypertension    Kidney stones    NICM (nonischemic cardiomyopathy) (HCC)    a. 07/2013 Echo: EF 20-25%, diff HK, Gr2 DD, mild MR, mod dil LA/RA. 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   12/19/2023 Morning   amiodarone  (PACERONE ) 200 MG tablet Take 2 tablets (400 mg total) by mouth daily. 180 tablet 3 12/19/2023 Morning   apixaban  (ELIQUIS ) 5 MG TABS tablet Take 1 tablet (5 mg total) by mouth 2 (two) times daily. 60 tablet 3 12/19/2023 at  2:00 PM   atorvastatin  (LIPITOR) 10 MG tablet Take 1 tablet (10 mg total) by mouth daily. 90 tablet 2 12/18/2023 Bedtime   benzonatate  (TESSALON ) 200 MG capsule Take 200 mg by mouth 2 (two) times daily as needed for cough.   12/18/2023 Morning   cetirizine  (EQ ALLERGY RELIEF, CETIRIZINE ,) 10 MG tablet Take 1 tablet (10 mg total) by mouth daily. 90  tablet 1 12/19/2023 Morning   cholecalciferol  (VITAMIN D3) 25 MCG (1000 UNIT) tablet Take 1,000 Units by mouth in the morning.   12/19/2023 Morning   clonazePAM  (KLONOPIN ) 0.5 MG tablet Take 1 tablet (0.5 mg total) by mouth 2 (two) times daily as needed for anxiety. (Patient taking differently: Take 0.5 mg by mouth 2 (two) times daily.) 60 tablet 1 12/19/2023 Morning   Clotrimazole  1 % OINT Apply to the affected area twice a day (Patient taking differently: Apply 1 Application topically 2 (two) times daily as needed. Apply to the affected area twice a day) 56.7 g 0 12/04/2023   colchicine  0.6 MG tablet 1/2 tab PO Q Mon/Wed/Fri (Patient taking differently: Take 0.5 tablets by mouth See admin instructions. 1/2 tab PO Q Mon/Wed/Fri) 30 tablet 1 12/16/2023   EPINEPHrine  0.3 mg/0.3 mL IJ SOAJ injection Inject 0.3 mg into the muscle as needed. (Patient taking differently: Inject 0.3 mg into the muscle as needed for anaphylaxis.) 1 each 1 Unknown   famotidine  (PEPCID ) 20 MG tablet TAKE 1 TABLET BY MOUTH 1 TO 2 TIMES DAILY AS NEEDED 180 tablet 0 12/19/2023  Morning   fluticasone  (FLONASE ) 50 MCG/ACT nasal spray Place 2 sprays into both nostrils daily. 16 g 6 12/19/2023 Morning   gabapentin  (NEURONTIN ) 300 MG capsule Take 1 capsule (300 mg total) by mouth at bedtime. 90 capsule 1 12/18/2023 Bedtime   insulin  aspart (NOVOLOG  FLEXPEN) 100 UNIT/ML FlexPen Inject 10-20 Units into the skin 3 (three) times daily before meals. (Patient taking differently: Inject 34 Units into the skin 3 (three) times daily before meals.) 60 mL 3 12/18/2023 Bedtime   loratadine  (CLARITIN ) 10 MG tablet Take 1 tablet (10 mg total) by mouth daily. 30 tablet 11 12/18/2023 Morning   methocarbamol  (ROBAXIN ) 500 MG tablet Take 1 tablet (500 mg total) by mouth daily. 90 tablet 1 12/18/2023 Bedtime   metolazone  (ZAROXOLYN ) 5 MG tablet Take 1 tablet (5 mg total) by mouth once a week. (Patient taking differently: Take 5 mg by mouth once a week. mondays) 12 tablet 2 12/14/2023   metoprolol  succinate (TOPROL -XL) 25 MG 24 hr tablet Take 0.5 tablets (12.5 mg total) by mouth daily. 45 tablet 3 12/19/2023 Morning   nystatin  (MYCOSTATIN /NYSTOP ) powder Apply 1 Application topically 3 (three) times daily. 60 g 2 12/19/2023 Morning   Polyethyl Glycol-Propyl Glycol (SYSTANE) 0.4-0.3 % SOLN Place 1 drop into both eyes daily as needed (dry eyes).   12/19/2023 Morning   potassium chloride  SA (KLOR-CON  M) 20 MEQ tablet Take 2 tablets (40 mEq total) by mouth daily. 180 tablet 2 12/19/2023 Morning   sertraline  (ZOLOFT ) 50 MG tablet Take 1.5 tablets (75 mg total) by mouth in the morning. 45 tablet 2 12/19/2023 Morning   SYMBICORT  160-4.5 MCG/ACT inhaler Inhale 2 puffs into the lungs daily.   12/19/2023 Morning   tamsulosin  (FLOMAX ) 0.4 MG CAPS capsule Take 2 capsules (0.8 mg total) by mouth daily. 180 capsule 3 12/19/2023 Morning   timolol  (TIMOPTIC ) 0.5 % ophthalmic solution Place 1 drop into both eyes every morning.   12/19/2023 Morning   torsemide  (DEMADEX ) 20 MG tablet Take 2 tablets (40 mg total) by mouth daily. 180  tablet 3 12/19/2023 Morning   [DISCONTINUED] insulin  glargine (LANTUS  SOLOSTAR) 100 UNIT/ML Solostar Pen Inject 45 Units into the skin daily. (Patient taking differently: Inject 45 Units into the skin at bedtime.) 45 mL 3 12/18/2023 Bedtime   hydrALAZINE  (APRESOLINE ) 25 MG tablet Take 0.5 tablets (12.5 mg total) by mouth 3 (three) times  daily. (Patient not taking: Reported on 12/19/2023) 90 tablet 6 Not Taking   Insulin  Pen Needle (TECHLITE PEN NEEDLES) 32G X 4 MM MISC Use to inject insulin  into the skin 3 times per day. 100 each 0    Scheduled:   amiodarone   400 mg Oral Daily   atorvastatin   10 mg Oral Daily   clonazePAM   0.5 mg Oral BID   gabapentin   300 mg Oral QHS   insulin  aspart  0-20 Units Subcutaneous TID WC   insulin  aspart  0-5 Units Subcutaneous QHS   insulin  aspart  6 Units Subcutaneous TID WC   insulin  glargine-yfgn  70 Units Subcutaneous Daily   metoprolol  succinate  12.5 mg Oral Daily   potassium chloride  SA  40 mEq Oral Daily   sertraline   75 mg Oral q AM   sodium chloride  flush  3 mL Intravenous Q12H   sucralfate   1 g Oral TID WC & HS   tamsulosin   0.8 mg Oral Daily   timolol   1 drop Both Eyes q morning   torsemide   40 mg Oral Daily    Assessment: Patient is a 58 year old male who presented to the ED with chest pain and worsening shortness of breath. Past medical history is significant for atrial fibrillation, post cardiac arrest s/p AICD placement, and NICM. Patient has been on PTA apixaban  throughout this admission.Transitioning today to IV heparin  due to severe pleuritic chest pain for 2 days with VQ scan pending to rule out PE. Last apixaban  dose 0800 this AM.  Patient hemoglobin has been stable with a slight downtrend in platelets since admission. Patient is stage IIIb CKD and renal function appears around baseline. Pharmacy has been consulted for the medical management of IV heparin . Given recent use of apixaban , will monitor anticoagulation until aPTT and heparin   levels correlate.  PM: aPTT subtherapeutic at 57 sec on heparin  1750 units/hr. No issues with heparin  infusion or bleeding documented per RN.  Goal of Therapy:  Heparin  level 0.3-0.7 units/ml aPTT 66-102 seconds Monitor platelets by anticoagulation protocol: Yes   Plan:  Increase heparin  infusion to 1950 units/hr Monitor aPTT 8 hours after rate increased Monitor daily CBC, aPTT, and heparin  levels Monitor for signs/symptoms of bleeding Follow up transition back to DOAC  Thank you for allowing pharmacy to be a part of this patient's care.  Rocky Slade, PharmD, BCPS 12/27/2023 9:17 PM  Please check AMION for all [facility] Pharmacy phone numbers After 10:00 PM, call Main Pharmacy [phone]

## 2023-12-27 NOTE — Progress Notes (Signed)
 PROGRESS NOTE    [name]  FMW:[id number] DOB: 1966/03/09 DOA: 12/19/2023 PCP: Vicci Barnie NOVAK, MD   58/M w cardiac arrest status post AICD placement, NICM, atrial fibrillation on anticoagulation, T2DM, stage IIIb CKD  who presented to the ED on 12/19/2023 with worsening shortness of breath over the last couple of days. Work up in the ED included nonacute CXR, admitted with CHF exacerbation  - Cards following, improved with diuresis - 7/18, discharge planned however subsequently reported ongoing left-sided chest pain, x-ray and troponins unremarkable - 7/19 AM, VQ scan ordered  Subjective: - Continues to have severe left-sided pleuritic chest pain, VQ scan still pending since yesterday morning  Assessment and Plan:  Acute on chronic systolic CHF -Repeat echo with EF 40-45%, moderate LVH, grade 1 DD, normal RV -GDMT limited by CKD -Improving with diuresis, 11 L negative, - Now euvolemic, transition to oral torsemide , continue Toprol  -GDMT limited by CKD  Left-sided pleuritic chest pain - New onset since 7/18, chest x-ray and troponins unremarkable - Remains on PPI,  - Despite anticoagulation with apixaban  for A-fib, given the severity of his symptoms will obtain VQ scan, ordered yesterday a.m. still pending, attempting to contact radiology tech on-call this weekend -Change apixaban  to IV heparin  in the interim - CT chest noncontrast was unremarkable -echo 7/15 : no pericardial effusion  Esophageal dysphagia - History of aspiration like episode prior to admission - Chest x-ray unremarkable, no fever or leukocytosis, discontinued antibiotics  - Continue PPI, refer to GI  Paroxysmal atrial fibrillation - Currently in sinus rhythm, continue amiodarone , Eliquis   History of VT Continue amiodarone    OSA: intolerant even of new CPAP machine at home  - CPAP nightly   AKI on stage IIIb CKD: SCr on admission is 3.2 from baseline 2.7. Improving with diuresis thus far.  -  Continue trending, pt of Dr. Kelsey.  - Avoid nephrotoxins.    IDT2DM with steroid induced hyperglycemia:  - CBG's uncontrolled on prednisone , discontinuing steroids today, increase Semglee  - FU with endocrinology, Dr. Trixie.    HLD:  - Continue statin   Depression:  - Continue SSRI   BPH:  - Continue tamsulosin     DVT prophylaxis: Apixaban  changed to IV heparin  Code Status: DNR Family Communication: brother at bedside yesterday Disposition Plan: Home pending improvement in symptoms  Consultants:    Procedures:   Antimicrobials:    Objective: Vitals:   12/26/23 2012 12/27/23 0005 12/27/23 0430 12/27/23 0736  BP: 111/64 117/63 115/66 107/62  Pulse: 67 70 70 68  Resp: 16 16 16 18   Temp: 97.6 F (36.4 C) 98.2 F (36.8 C) 98.5 F (36.9 C) 98.4 F (36.9 C)  TempSrc: Oral Oral Oral Oral  SpO2: 93% 94% 97% 97%  Weight:   129.4 kg   Height:        Intake/Output Summary (Last 24 hours) at 12/27/2023 1055 Last data filed at 12/27/2023 0900 Gross per 24 hour  Intake 598 ml  Output 3130 ml  Net -2532 ml   Filed Weights   12/25/23 0441 12/26/23 0500 12/27/23 0430  Weight: 130.2 kg 128 kg 129.4 kg    Examination:  General exam: He is chronically ill, AAO x 3, uncomfortable appearing HEENT: Neck obese unable to assess JVD Respiratory system: Decreased breath sounds at the bases Cardiovascular system: S1 & S2 heard, RRR.  Abd: nondistended, soft and nontender.Normal bowel sounds heard. Central nervous system: Alert and oriented. No focal neurological deficits. Extremities: no edema Skin: No rashes Psychiatry:  Flat affect, uncomfortable    Data Reviewed:   CBC: Recent Labs  Lab 12/27/23 0851  WBC 10.0  HGB 14.3  HCT 43.3  MCV 93.3  PLT 274   Basic Metabolic Panel: Recent Labs  Lab 12/21/23 1154 12/22/23 0250 12/23/23 0227 12/24/23 0230 12/25/23 0204 12/26/23 0228 12/27/23 0851  NA 134* 135 138 137 138 138 136  K 3.5 3.7 3.4* 3.8 3.3*  3.3* 3.7  CL 90* 94* 96* 99 96* 99 97*  CO2 29 26 28 24 29 29 25   GLUCOSE 258* 328* 249* 181* 156* 118* 184*  BUN 62* 64* 65* 56* 57* 57* 54*  CREATININE 3.00* 2.84* 2.66* 2.49* 2.57* 2.61* 2.69*  CALCIUM  9.6 9.6 9.9 9.6 9.3 9.1 9.2  MG 2.0 2.2  --  2.3 2.1  --   --   PHOS  --   --  4.1  --   --   --   --    GFR: Estimated Creatinine Clearance: 41.7 mL/min (A) (by C-G formula based on SCr of 2.69 mg/dL (H)). Liver Function Tests: Recent Labs  Lab 12/23/23 0227 12/27/23 0851  AST  --  37  ALT  --  47*  ALKPHOS  --  81  BILITOT  --  0.7  PROT  --  8.1  ALBUMIN  3.7 3.9   No results for input(s): LIPASE, AMYLASE in the last 168 hours. No results for input(s): AMMONIA in the last 168 hours. Coagulation Profile: No results for input(s): INR, PROTIME in the last 168 hours. Cardiac Enzymes: No results for input(s): CKTOTAL, CKMB, CKMBINDEX, TROPONINI in the last 168 hours. BNP (last 3 results) No results for input(s): PROBNP in the last 8760 hours. HbA1C: No results for input(s): HGBA1C in the last 72 hours. CBG: Recent Labs  Lab 12/26/23 1140 12/26/23 1604 12/26/23 2142 12/27/23 0635 12/27/23 1051  GLUCAP 199* 190* 206* 170* 200*   Lipid Profile: No results for input(s): CHOL, HDL, LDLCALC, TRIG, CHOLHDL, LDLDIRECT in the last 72 hours. Thyroid  Function Tests: No results for input(s): TSH, T4TOTAL, FREET4, T3FREE, THYROIDAB in the last 72 hours. Anemia Panel: No results for input(s): VITAMINB12, FOLATE, FERRITIN, TIBC, IRON, RETICCTPCT in the last 72 hours. Urine analysis:    Component Value Date/Time   COLORURINE STRAW (A) 10/08/2023 0952   APPEARANCEUR CLEAR 10/08/2023 0952   APPEARANCEUR Clear 07/17/2020 1655   LABSPEC 1.009 10/08/2023 0952   PHURINE 5.0 10/08/2023 0952   GLUCOSEU 50 (A) 10/08/2023 0952   HGBUR NEGATIVE 10/08/2023 0952   BILIRUBINUR NEGATIVE 10/08/2023 0952   BILIRUBINUR negative  01/30/2022 1705   BILIRUBINUR Negative 07/17/2020 1655   KETONESUR NEGATIVE 10/08/2023 0952   PROTEINUR NEGATIVE 10/08/2023 0952   UROBILINOGEN 0.2 01/30/2022 1705   UROBILINOGEN 1.0 06/20/2013 1032   NITRITE NEGATIVE 10/08/2023 0952   LEUKOCYTESUR NEGATIVE 10/08/2023 0952   Sepsis Labs: @LABRCNTIP (procalcitonin:4,lacticidven:4)  )No results found for this or any previous visit (from the past 240 hours).   Radiology Studies: CT CHEST WO CONTRAST Result Date: 12/27/2023 CLINICAL DATA:  Nonspecific chest pain EXAM: CT CHEST WITHOUT CONTRAST TECHNIQUE: Multidetector CT imaging of the chest was performed following the standard protocol without IV contrast. RADIATION DOSE REDUCTION: This exam was performed according to the departmental dose-optimization program which includes automated exposure control, adjustment of the mA and/or kV according to patient size and/or use of iterative reconstruction technique. COMPARISON:  None Available. FINDINGS: Cardiovascular: Left subclavian dual lead pacemaker in place with leads within the right atrium and right ventricle.  No significant coronary artery calcification. Global cardiac size ismildly enlarged. No pericardial effusion. Central pulmonary arteries are of normal caliber. No significant atherosclerotic calcification within the thoracic aorta. No aortic aneurysm. Mediastinum/Nodes: No enlarged mediastinal or axillary lymph nodes. Thyroid  gland, trachea, and esophagus demonstrate no significant findings. Tiny hiatal hernia Lungs/Pleura: Lungs are clear. No pleural effusion or pneumothorax. Upper Abdomen: 15 mm right and 20 mm left adrenal nodules are again identified and are stable since remote prior examination of 09/17/2017, safely considered benign and likely representing benign adrenal adenoma given their stability over time. No acute abnormality. Musculoskeletal: No chest wall mass or suspicious bone lesions identified. IMPRESSION: 1. No acute  intrathoracic findings. 2. Mild cardiomegaly. 3. Tiny hiatal hernia. 4. 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 Electronically Signed   By: Dorethia Molt M.D.   On: 12/27/2023 10:23   DG CHEST PORT 1 VIEW Result Date: 12/25/2023 CLINICAL DATA:  Chest pain EXAM: PORTABLE CHEST 1 VIEW COMPARISON:  X-ray 12/19/2023. FINDINGS: Underinflation. Enlarged cardiopericardial silhouette. Tortuous ectatic aorta. No pneumothorax or effusion. No edema. Bronchovascular crowding. Left upper chest battery pack with defibrillator leads along the right side of the heart. Overlapping cardiac leads. Film is under penetrated. IMPRESSION: Underinflation. Enlarged cardiac silhouette with some prominence of central vasculature. No edema. Defibrillator. Electronically Signed   By: Ranell Bring M.D.   On: 12/25/2023 15:34      Scheduled Meds:  amiodarone   400 mg Oral Daily   atorvastatin   10 mg Oral Daily   clonazePAM   0.5 mg Oral BID   gabapentin   300 mg Oral QHS   insulin  aspart  0-20 Units Subcutaneous TID WC   insulin  aspart  0-5 Units Subcutaneous QHS   insulin  aspart  6 Units Subcutaneous TID WC   insulin  glargine-yfgn  70 Units Subcutaneous Daily   metoprolol  succinate  12.5 mg Oral Daily   potassium chloride  SA  40 mEq Oral Daily   sertraline   75 mg Oral q AM   sodium chloride  flush  3 mL Intravenous Q12H   sucralfate   1 g Oral TID WC & HS   tamsulosin   0.8 mg Oral Daily   timolol   1 drop Both Eyes q morning   torsemide   40 mg Oral Daily   Continuous Infusions:   LOS: 8 days    Time spent:    Sigurd Pac, MD [facility]   12/27/2023, 10:55 AM

## 2023-12-27 NOTE — Progress Notes (Signed)
 PHARMACY - ANTICOAGULATION CONSULT NOTE  Pharmacy Consult for IV heparin  Indication: Suspected PE  Allergies  Allergen Reactions   Bee Venom Anaphylaxis   Trulicity  [Dulaglutide ] Anaphylaxis and Diarrhea    Extra dose of Trulicity  caused cardiac arrest. Patient experience onset of diarrhea with using this medication - cleared after med stopped.    Patient Measurements: Height: 6' 1 (185.4 cm) Weight: 129.4 kg (285 lb 4.4 oz) IBW/kg (Calculated) : 79.9 HEPARIN  DW (KG): 109.3  Vital Signs: Temp: 98.4 F (36.9 C) (07/20 0736) Temp Source: Oral (07/20 0736) BP: 107/62 (07/20 0736) Pulse Rate: 68 (07/20 0736)  Labs: Recent Labs    12/25/23 0204 12/25/23 0930 12/26/23 0228 12/26/23 1411 12/27/23 0851  HGB  --   --   --   --  14.3  HCT  --   --   --   --  43.3  PLT  --   --   --   --  274  CREATININE 2.57*  --  2.61*  --  2.69*  TROPONINIHS  --  13  --  27*  --     Estimated Creatinine Clearance: 41.7 mL/min (A) (by C-G formula based on SCr of 2.69 mg/dL (H)).   Medical History: Past Medical History:  Diagnosis Date   AICD (automatic cardioverter/defibrillator) present    Chronic bronchitis (HCC)    get it most q yr (12/23/2013)   Chronic systolic (congestive) heart failure (HCC)    Fracture of left humerus    a. 07/2013.   GERD (gastroesophageal reflux disease)    not at present time   Heart murmur    born w/it    History of renal calculi    Hypertension    Kidney stones    NICM (nonischemic cardiomyopathy) (HCC)    a. 07/2013 Echo: EF 20-25%, diff HK, Gr2 DD, mild MR, mod dil LA/RA. 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   12/19/2023 Morning   amiodarone  (PACERONE ) 200 MG tablet Take 2 tablets (400 mg total) by mouth daily. 180 tablet 3 12/19/2023 Morning   apixaban  (ELIQUIS ) 5 MG TABS tablet Take 1 tablet (5 mg total) by mouth 2 (two) times daily. 60 tablet 3 12/19/2023 at  2:00 PM   atorvastatin  (LIPITOR) 10 MG tablet Take 1 tablet (10 mg total) by mouth daily. 90 tablet 2 12/18/2023 Bedtime   benzonatate  (TESSALON ) 200 MG capsule Take 200 mg by mouth 2 (two) times daily as needed for cough.   12/18/2023 Morning   cetirizine  (EQ ALLERGY RELIEF, CETIRIZINE ,) 10 MG tablet Take 1 tablet (10 mg total) by mouth daily. 90 tablet 1 12/19/2023 Morning   cholecalciferol  (VITAMIN D3) 25 MCG (1000 UNIT) tablet Take 1,000 Units by mouth in the morning.   12/19/2023 Morning   clonazePAM  (KLONOPIN ) 0.5 MG tablet Take 1 tablet (0.5 mg total) by mouth 2 (two) times daily as needed for anxiety. (Patient taking differently: Take 0.5 mg by mouth 2 (two) times daily.) 60 tablet 1 12/19/2023 Morning   Clotrimazole  1 % OINT Apply to  the affected area twice a day (Patient taking differently: Apply 1 Application topically 2 (two) times daily as needed. Apply to the affected area twice a day) 56.7 g 0 12/04/2023   colchicine  0.6 MG tablet 1/2 tab PO Q Mon/Wed/Fri (Patient taking differently: Take 0.5 tablets by mouth See admin instructions. 1/2 tab PO Q Mon/Wed/Fri) 30 tablet 1 12/16/2023   EPINEPHrine  0.3 mg/0.3 mL IJ SOAJ injection Inject 0.3 mg into the muscle as needed. (Patient taking differently: Inject 0.3 mg into the muscle as needed for anaphylaxis.) 1 each 1 Unknown   famotidine  (PEPCID ) 20 MG tablet TAKE 1 TABLET BY MOUTH 1 TO 2 TIMES DAILY AS NEEDED 180 tablet 0 12/19/2023 Morning   fluticasone  (FLONASE ) 50 MCG/ACT nasal spray Place 2 sprays into both nostrils daily. 16 g 6 12/19/2023 Morning   gabapentin  (NEURONTIN ) 300 MG capsule Take 1 capsule  (300 mg total) by mouth at bedtime. 90 capsule 1 12/18/2023 Bedtime   insulin  aspart (NOVOLOG  FLEXPEN) 100 UNIT/ML FlexPen Inject 10-20 Units into the skin 3 (three) times daily before meals. (Patient taking differently: Inject 34 Units into the skin 3 (three) times daily before meals.) 60 mL 3 12/18/2023 Bedtime   loratadine  (CLARITIN ) 10 MG tablet Take 1 tablet (10 mg total) by mouth daily. 30 tablet 11 12/18/2023 Morning   methocarbamol  (ROBAXIN ) 500 MG tablet Take 1 tablet (500 mg total) by mouth daily. 90 tablet 1 12/18/2023 Bedtime   metolazone  (ZAROXOLYN ) 5 MG tablet Take 1 tablet (5 mg total) by mouth once a week. (Patient taking differently: Take 5 mg by mouth once a week. mondays) 12 tablet 2 12/14/2023   metoprolol  succinate (TOPROL -XL) 25 MG 24 hr tablet Take 0.5 tablets (12.5 mg total) by mouth daily. 45 tablet 3 12/19/2023 Morning   nystatin  (MYCOSTATIN /NYSTOP ) powder Apply 1 Application topically 3 (three) times daily. 60 g 2 12/19/2023 Morning   Polyethyl Glycol-Propyl Glycol (SYSTANE) 0.4-0.3 % SOLN Place 1 drop into both eyes daily as needed (dry eyes).   12/19/2023 Morning   potassium chloride  SA (KLOR-CON  M) 20 MEQ tablet Take 2 tablets (40 mEq total) by mouth daily. 180 tablet 2 12/19/2023 Morning   sertraline  (ZOLOFT ) 50 MG tablet Take 1.5 tablets (75 mg total) by mouth in the morning. 45 tablet 2 12/19/2023 Morning   SYMBICORT  160-4.5 MCG/ACT inhaler Inhale 2 puffs into the lungs daily.   12/19/2023 Morning   tamsulosin  (FLOMAX ) 0.4 MG CAPS capsule Take 2 capsules (0.8 mg total) by mouth daily. 180 capsule 3 12/19/2023 Morning   timolol  (TIMOPTIC ) 0.5 % ophthalmic solution Place 1 drop into both eyes every morning.   12/19/2023 Morning   torsemide  (DEMADEX ) 20 MG tablet Take 2 tablets (40 mg total) by mouth daily. 180 tablet 3 12/19/2023 Morning   [DISCONTINUED] insulin  glargine (LANTUS  SOLOSTAR) 100 UNIT/ML Solostar Pen Inject 45 Units into the skin daily. (Patient taking differently: Inject  45 Units into the skin at bedtime.) 45 mL 3 12/18/2023 Bedtime   hydrALAZINE  (APRESOLINE ) 25 MG tablet Take 0.5 tablets (12.5 mg total) by mouth 3 (three) times daily. (Patient not taking: Reported on 12/19/2023) 90 tablet 6 Not Taking   Insulin  Pen Needle (TECHLITE PEN NEEDLES) 32G X 4 MM MISC Use to inject insulin  into the skin 3 times per day. 100 each 0    Scheduled:   amiodarone   400 mg Oral Daily   atorvastatin   10 mg Oral Daily   clonazePAM   0.5 mg Oral BID   gabapentin   300 mg Oral  QHS   insulin  aspart  0-20 Units Subcutaneous TID WC   insulin  aspart  0-5 Units Subcutaneous QHS   insulin  aspart  6 Units Subcutaneous TID WC   insulin  glargine-yfgn  70 Units Subcutaneous Daily   metoprolol  succinate  12.5 mg Oral Daily   potassium chloride  SA  40 mEq Oral Daily   sertraline   75 mg Oral q AM   sodium chloride  flush  3 mL Intravenous Q12H   sucralfate   1 g Oral TID WC & HS   tamsulosin   0.8 mg Oral Daily   timolol   1 drop Both Eyes q morning   torsemide   40 mg Oral Daily    Assessment: Patient is a 58 year old male who presented to the ED with chest pain and worsening shortness of breath. Past medical history is significant for atrial fibrillation, post cardiac arrest s/p AICD placement, and NICM. Patient has been on PTA apixaban  throughout this admission.Transitioning today to IV heparin  due to severe pleuritic chest pain for 2 days with VQ scan pending to rule out PE. Last apixaban  dose 0800 this AM.  Patient hemoglobin has been stable with a slight downtrend in platelets since admission. Patient is stage IIIb CKD and renal function appears around baseline. Pharmacy has been consulted for the medical management of IV heparin . Given recent use of apixaban , will monitor anticoagulation until aPTT and heparin  levels correlate.  Goal of Therapy:  Heparin  level 0.3-0.7 units/ml aPTT 66-102 seconds Monitor platelets by anticoagulation protocol: Yes   Plan:  Start heparin  infusion at  1750 units/hr with no bolus Follow up on baseline aPTT and heparin  level Monitor aPTT 8 hours after start of infusion Monitor daily CBC, aPTT, and heparin  levels Monitor for signs/symptoms of bleeding  Thank you for allowing pharmacy to be a part of this patient's care.  Mendel Barter, PharmD PGY1 Clinical Pharmacist [facility]  12/27/2023 11:38 AM

## 2023-12-28 ENCOUNTER — Encounter

## 2023-12-28 ENCOUNTER — Inpatient Hospital Stay (INDEPENDENT_AMBULATORY_CARE_PROVIDER_SITE_OTHER)

## 2023-12-28 DIAGNOSIS — I5022 Chronic systolic (congestive) heart failure: Secondary | ICD-10-CM | POA: Diagnosis not present

## 2023-12-28 DIAGNOSIS — Z95810 Presence of automatic (implantable) cardiac defibrillator: Secondary | ICD-10-CM | POA: Diagnosis not present

## 2023-12-28 DIAGNOSIS — N179 Acute kidney failure, unspecified: Secondary | ICD-10-CM | POA: Diagnosis not present

## 2023-12-28 DIAGNOSIS — G4733 Obstructive sleep apnea (adult) (pediatric): Secondary | ICD-10-CM | POA: Diagnosis not present

## 2023-12-28 DIAGNOSIS — K219 Gastro-esophageal reflux disease without esophagitis: Secondary | ICD-10-CM

## 2023-12-28 DIAGNOSIS — J441 Chronic obstructive pulmonary disease with (acute) exacerbation: Secondary | ICD-10-CM | POA: Diagnosis not present

## 2023-12-28 DIAGNOSIS — I4891 Unspecified atrial fibrillation: Secondary | ICD-10-CM | POA: Diagnosis not present

## 2023-12-28 DIAGNOSIS — R079 Chest pain, unspecified: Secondary | ICD-10-CM

## 2023-12-28 DIAGNOSIS — I5043 Acute on chronic combined systolic (congestive) and diastolic (congestive) heart failure: Secondary | ICD-10-CM | POA: Diagnosis not present

## 2023-12-28 DIAGNOSIS — I509 Heart failure, unspecified: Secondary | ICD-10-CM

## 2023-12-28 LAB — BASIC METABOLIC PANEL WITH GFR
Anion gap: 15 (ref 5–15)
BUN: 52 mg/dL — ABNORMAL HIGH (ref 6–20)
CO2: 30 mmol/L (ref 22–32)
Calcium: 9.3 mg/dL (ref 8.9–10.3)
Chloride: 95 mmol/L — ABNORMAL LOW (ref 98–111)
Creatinine, Ser: 2.68 mg/dL — ABNORMAL HIGH (ref 0.61–1.24)
GFR, Estimated: 27 mL/min — ABNORMAL LOW (ref >60)
Glucose, Bld: 155 mg/dL — ABNORMAL HIGH (ref 70–99)
Potassium: 3.4 mmol/L — ABNORMAL LOW (ref 3.5–5.1)
Sodium: 140 mmol/L (ref 135–145)

## 2023-12-28 LAB — GLUCOSE, CAPILLARY
Glucose-Capillary: 128 mg/dL — ABNORMAL HIGH (ref 70–99)
Glucose-Capillary: 149 mg/dL — ABNORMAL HIGH (ref 70–99)
Glucose-Capillary: 205 mg/dL — ABNORMAL HIGH (ref 70–99)
Glucose-Capillary: 244 mg/dL — ABNORMAL HIGH (ref 70–99)
Glucose-Capillary: 249 mg/dL — ABNORMAL HIGH (ref 70–99)
Glucose-Capillary: 269 mg/dL — ABNORMAL HIGH (ref 70–99)

## 2023-12-28 LAB — CBC
HCT: 43.9 % (ref 39.0–52.0)
Hemoglobin: 14.3 g/dL (ref 13.0–17.0)
MCH: 30.8 pg (ref 26.0–34.0)
MCHC: 32.6 g/dL (ref 30.0–36.0)
MCV: 94.4 fL (ref 80.0–100.0)
Platelets: 274 K/uL (ref 150–400)
RBC: 4.65 MIL/uL (ref 4.22–5.81)
RDW: 15.2 % (ref 11.5–15.5)
WBC: 11 K/uL — ABNORMAL HIGH (ref 4.0–10.5)
nRBC: 0.2 % (ref 0.0–0.2)

## 2023-12-28 LAB — HEPARIN LEVEL (UNFRACTIONATED): Heparin Unfractionated: >1.1 [IU]/mL — ABNORMAL HIGH (ref 0.30–0.70)

## 2023-12-28 LAB — SEDIMENTATION RATE: Sed Rate: 32 mm/h — ABNORMAL HIGH (ref 0–16)

## 2023-12-28 LAB — C-REACTIVE PROTEIN: CRP: 10.4 mg/dL — ABNORMAL HIGH (ref <1.0)

## 2023-12-28 LAB — APTT: aPTT: 64 s — ABNORMAL HIGH (ref 24–36)

## 2023-12-28 LAB — TROPONIN I (HIGH SENSITIVITY)
Troponin I (High Sensitivity): 65 ng/L — ABNORMAL HIGH (ref <18)
Troponin I (High Sensitivity): 66 ng/L — ABNORMAL HIGH (ref <18)

## 2023-12-28 MED ORDER — POTASSIUM CHLORIDE CRYS ER 20 MEQ PO TBCR
20.0000 meq | EXTENDED_RELEASE_TABLET | Freq: Once | ORAL | Status: AC
  Administered 2023-12-28: 20 meq via ORAL
  Filled 2023-12-28: qty 1

## 2023-12-28 MED ORDER — PANTOPRAZOLE SODIUM 40 MG IV SOLR
40.0000 mg | Freq: Two times a day (BID) | INTRAVENOUS | Status: DC
  Administered 2023-12-28 – 2024-01-01 (×10): 40 mg via INTRAVENOUS
  Filled 2023-12-28 (×10): qty 10

## 2023-12-28 MED ORDER — POTASSIUM CHLORIDE CRYS ER 20 MEQ PO TBCR
40.0000 meq | EXTENDED_RELEASE_TABLET | Freq: Once | ORAL | Status: AC
  Administered 2023-12-28: 40 meq via ORAL
  Filled 2023-12-28: qty 2

## 2023-12-28 MED ORDER — FLUTICASONE FUROATE-VILANTEROL 200-25 MCG/ACT IN AEPB: 1.0000 | INHALATION_SPRAY | Freq: Every day | RESPIRATORY_TRACT | Status: DC

## 2023-12-28 MED ORDER — COLCHICINE 0.3 MG HALF TABLET
0.3000 mg | ORAL_TABLET | ORAL | Status: DC
  Administered 2023-12-28 – 2024-01-04 (×4): 0.3 mg via ORAL
  Filled 2023-12-28 (×4): qty 1

## 2023-12-28 MED ORDER — MORPHINE SULFATE (PF) 2 MG/ML IV SOLN: 1.0000 mg | Freq: Once | INTRAVENOUS | Status: DC

## 2023-12-28 MED ORDER — POLYETHYLENE GLYCOL 3350 17 G PO PACK
17.0000 g | PACK | Freq: Every day | ORAL | Status: DC
  Filled 2023-12-28 (×6): qty 1

## 2023-12-28 MED ORDER — REVEFENACIN 175 MCG/3ML IN SOLN
175.0000 ug | Freq: Every day | RESPIRATORY_TRACT | Status: DC
  Administered 2023-12-28 – 2024-01-05 (×8): 175 ug via RESPIRATORY_TRACT
  Filled 2023-12-28 (×9): qty 3

## 2023-12-28 MED ORDER — ARFORMOTEROL TARTRATE 15 MCG/2ML IN NEBU
15.0000 ug | INHALATION_SOLUTION | Freq: Two times a day (BID) | RESPIRATORY_TRACT | Status: DC
  Administered 2023-12-28 – 2024-01-05 (×15): 15 ug via RESPIRATORY_TRACT
  Filled 2023-12-28 (×16): qty 2

## 2023-12-28 MED ORDER — METHYLPREDNISOLONE SODIUM SUCC 40 MG IJ SOLR
40.0000 mg | Freq: Two times a day (BID) | INTRAMUSCULAR | Status: DC
  Administered 2023-12-28 – 2023-12-31 (×7): 40 mg via INTRAVENOUS
  Filled 2023-12-28 (×7): qty 1

## 2023-12-28 MED ORDER — BUDESONIDE 0.5 MG/2ML IN SUSP
0.5000 mg | Freq: Two times a day (BID) | RESPIRATORY_TRACT | Status: DC
  Administered 2023-12-28 – 2024-01-05 (×15): 0.5 mg via RESPIRATORY_TRACT
  Filled 2023-12-28 (×16): qty 2

## 2023-12-28 MED ORDER — APIXABAN 5 MG PO TABS
5.0000 mg | ORAL_TABLET | Freq: Two times a day (BID) | ORAL | Status: DC
  Administered 2023-12-28 – 2024-01-03 (×13): 5 mg via ORAL
  Filled 2023-12-28 (×13): qty 1

## 2023-12-28 MED ORDER — MORPHINE SULFATE (PF) 2 MG/ML IV SOLN: 2.0000 mg | INTRAVENOUS | Status: DC

## 2023-12-28 MED ORDER — INSULIN GLARGINE-YFGN 100 UNIT/ML ~~LOC~~ SOLN
40.0000 [IU] | Freq: Two times a day (BID) | SUBCUTANEOUS | Status: DC
  Administered 2023-12-28: 40 [IU] via SUBCUTANEOUS
  Filled 2023-12-28 (×3): qty 0.4

## 2023-12-28 MED ORDER — LIDOCAINE 5 % EX PTCH
1.0000 | MEDICATED_PATCH | CUTANEOUS | Status: AC
  Administered 2023-12-28 – 2023-12-29 (×2): 1 via TRANSDERMAL
  Filled 2023-12-28 (×2): qty 1

## 2023-12-28 NOTE — Progress Notes (Signed)
 PHARMACY - ANTICOAGULATION CONSULT NOTE  Pharmacy Consult for IV heparin  >> apixaban  Indication: Suspected PE  Allergies  Allergen Reactions   Bee Venom Anaphylaxis   Trulicity  [Dulaglutide ] Anaphylaxis and Diarrhea    Extra dose of Trulicity  caused cardiac arrest. Patient experience onset of diarrhea with using this medication - cleared after med stopped.    Patient Measurements: Height: 6' 0.99 (185.4 cm) Weight: 129.1 kg (284 lb 11.2 oz) IBW/kg (Calculated) : 79.88 HEPARIN  DW (KG): 108.7  Vital Signs: Temp: 99.5 F (37.5 C) (07/21 0748) Temp Source: Oral (07/21 0748) BP: 134/76 (07/21 0748) Pulse Rate: 87 (07/21 0748)  Labs: Recent Labs    12/26/23 0228 12/26/23 1411 12/27/23 0851 12/27/23 1208 12/27/23 2048 12/28/23 0302 12/28/23 0900  HGB  --   --  14.3  --   --  14.3  --   HCT  --   --  43.3  --   --  43.9  --   PLT  --   --  274  --   --  274  --   APTT  --   --   --  30 57*  --  64*  HEPARINUNFRC  --   --   --  >1.10*  --   --  >1.10*  CREATININE 2.61*  --  2.69*  --   --  2.68*  --   TROPONINIHS  --  27*  --   --   --  65* 66*    Estimated Creatinine Clearance: 41.8 mL/min (A) (by C-G formula based on SCr of 2.68 mg/dL (H)).   Medical History: Past Medical History:  Diagnosis Date   AICD (automatic cardioverter/defibrillator) present    Chronic bronchitis (HCC)    get it most q yr (12/23/2013)   Chronic systolic (congestive) heart failure (HCC)    Fracture of left humerus    a. 07/2013.   GERD (gastroesophageal reflux disease)    not at present time   Heart murmur    born w/it    History of renal calculi    Hypertension    Kidney stones    NICM (nonischemic cardiomyopathy) (HCC)    a. 07/2013 Echo: EF 20-25%, diff HK, Gr2 DD, mild MR, mod dil LA/RA. 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   12/19/2023 Morning   amiodarone  (PACERONE ) 200 MG tablet Take 2 tablets (400 mg total) by mouth daily. 180 tablet 3 12/19/2023 Morning   apixaban  (ELIQUIS ) 5 MG TABS tablet Take 1 tablet (5 mg total) by mouth 2 (two) times daily. 60 tablet 3 12/19/2023 at  2:00 PM   atorvastatin  (LIPITOR) 10 MG tablet Take 1 tablet (10 mg total) by mouth daily. 90 tablet 2 12/18/2023 Bedtime   benzonatate  (TESSALON ) 200 MG capsule Take 200 mg by mouth 2 (two) times daily as needed for cough.   12/18/2023 Morning   cetirizine  (EQ ALLERGY RELIEF, CETIRIZINE ,) 10 MG tablet Take 1 tablet (10 mg total) by mouth daily. 90 tablet 1 12/19/2023 Morning   cholecalciferol  (VITAMIN D3) 25  MCG (1000 UNIT) tablet Take 1,000 Units by mouth in the morning.   12/19/2023 Morning   clonazePAM  (KLONOPIN ) 0.5 MG tablet Take 1 tablet (0.5 mg total) by mouth 2 (two) times daily as needed for anxiety. (Patient taking differently: Take 0.5 mg by mouth 2 (two) times daily.) 60 tablet 1 12/19/2023 Morning   Clotrimazole  1 % OINT Apply to the affected area twice a day (Patient taking differently: Apply 1 Application topically 2 (two) times daily as needed. Apply to the affected area twice a day) 56.7 g 0 12/04/2023   colchicine  0.6 MG tablet 1/2 tab PO Q Mon/Wed/Fri (Patient taking differently: Take 0.5 tablets by mouth See admin instructions. 1/2 tab PO Q Mon/Wed/Fri) 30 tablet 1 12/16/2023   EPINEPHrine  0.3 mg/0.3 mL IJ SOAJ injection Inject 0.3 mg into the muscle as needed. (Patient taking differently: Inject 0.3 mg into the muscle as needed for anaphylaxis.) 1 each 1 Unknown   famotidine  (PEPCID ) 20 MG tablet TAKE 1 TABLET BY MOUTH 1 TO 2 TIMES DAILY AS NEEDED 180 tablet 0  12/19/2023 Morning   fluticasone  (FLONASE ) 50 MCG/ACT nasal spray Place 2 sprays into both nostrils daily. 16 g 6 12/19/2023 Morning   gabapentin  (NEURONTIN ) 300 MG capsule Take 1 capsule (300 mg total) by mouth at bedtime. 90 capsule 1 12/18/2023 Bedtime   insulin  aspart (NOVOLOG  FLEXPEN) 100 UNIT/ML FlexPen Inject 10-20 Units into the skin 3 (three) times daily before meals. (Patient taking differently: Inject 34 Units into the skin 3 (three) times daily before meals.) 60 mL 3 12/18/2023 Bedtime   loratadine  (CLARITIN ) 10 MG tablet Take 1 tablet (10 mg total) by mouth daily. 30 tablet 11 12/18/2023 Morning   methocarbamol  (ROBAXIN ) 500 MG tablet Take 1 tablet (500 mg total) by mouth daily. 90 tablet 1 12/18/2023 Bedtime   metolazone  (ZAROXOLYN ) 5 MG tablet Take 1 tablet (5 mg total) by mouth once a week. (Patient taking differently: Take 5 mg by mouth once a week. mondays) 12 tablet 2 12/14/2023   metoprolol  succinate (TOPROL -XL) 25 MG 24 hr tablet Take 0.5 tablets (12.5 mg total) by mouth daily. 45 tablet 3 12/19/2023 Morning   nystatin  (MYCOSTATIN /NYSTOP ) powder Apply 1 Application topically 3 (three) times daily. 60 g 2 12/19/2023 Morning   Polyethyl Glycol-Propyl Glycol (SYSTANE) 0.4-0.3 % SOLN Place 1 drop into both eyes daily as needed (dry eyes).   12/19/2023 Morning   potassium chloride  SA (KLOR-CON  M) 20 MEQ tablet Take 2 tablets (40 mEq total) by mouth daily. 180 tablet 2 12/19/2023 Morning   sertraline  (ZOLOFT ) 50 MG tablet Take 1.5 tablets (75 mg total) by mouth in the morning. 45 tablet 2 12/19/2023 Morning   SYMBICORT  160-4.5 MCG/ACT inhaler Inhale 2 puffs into the lungs daily.   12/19/2023 Morning   tamsulosin  (FLOMAX ) 0.4 MG CAPS capsule Take 2 capsules (0.8 mg total) by mouth daily. 180 capsule 3 12/19/2023 Morning   timolol  (TIMOPTIC ) 0.5 % ophthalmic solution Place 1 drop into both eyes every morning.   12/19/2023 Morning   torsemide  (DEMADEX ) 20 MG tablet Take 2 tablets (40 mg total) by mouth  daily. 180 tablet 3 12/19/2023 Morning   [DISCONTINUED] insulin  glargine (LANTUS  SOLOSTAR) 100 UNIT/ML Solostar Pen Inject 45 Units into the skin daily. (Patient taking differently: Inject 45 Units into the skin at bedtime.) 45 mL 3 12/18/2023 Bedtime   hydrALAZINE  (APRESOLINE ) 25 MG tablet Take 0.5 tablets (12.5 mg total) by mouth 3 (three) times daily. (Patient not taking: Reported on 12/19/2023) 90 tablet 6  Not Taking   Insulin  Pen Needle (TECHLITE PEN NEEDLES) 32G X 4 MM MISC Use to inject insulin  into the skin 3 times per day. 100 each 0    Scheduled:   amiodarone   400 mg Oral Daily   atorvastatin   10 mg Oral Daily   clonazePAM   0.5 mg Oral BID   gabapentin   300 mg Oral QHS   insulin  aspart  0-20 Units Subcutaneous TID WC   insulin  aspart  0-5 Units Subcutaneous QHS   insulin  aspart  6 Units Subcutaneous TID WC   insulin  glargine-yfgn  70 Units Subcutaneous Daily   methylPREDNISolone  (SOLU-MEDROL ) injection  40 mg Intravenous Q12H   metoprolol  succinate  12.5 mg Oral Daily   potassium chloride  SA  40 mEq Oral Daily   sertraline   75 mg Oral q AM   sodium chloride  flush  3 mL Intravenous Q12H   sucralfate   1 g Oral TID WC & HS   tamsulosin   0.8 mg Oral Daily   timolol   1 drop Both Eyes q morning   torsemide   40 mg Oral Daily    Assessment: Patient is a 58 year old male who presented to the ED with chest pain and worsening shortness of breath. PMH significant for Afib, post cardiac arrest s/p AICD placement, and NICM. Patient has been on PTA apixaban  throughout this admission.Transitioned to IV heparin  7/20 due to severe pleuritic chest pain for 2 days with VQ scan pending to rule out PE. Last apixaban  dose 0800 7/20.  Patient is stage IIIb CKD and renal function appears around baseline. Pharmacy has been consulted for the medical management of IV heparin . Given recent use of apixaban , will monitor anticoagulation until aPTT and heparin  levels correlate.  7/21: aPTT slightly  subtherapeutic at 64 sec on heparin  1950 units/hr. No issues with heparin  infusion or bleeding documented per RN. Patient Hgb has been stable with a slight downtrend in platelets since admission but stable. VQ scan unremarkable; MD discontinued heparin  and switched patient to apixaban  5mg  PO BID (PTA med).   Goal of Therapy:  Monitor platelets by anticoagulation protocol: Yes   Plan:  Stop heparin   Start apixaban  5mg  PO BID Monitor for signs/symptoms of bleeding  Thank you for allowing pharmacy to be a part of this patient's care.  Elma Fail, PharmD PGY1 Clinical Pharmacist [facility]  12/28/2023 11:22 AM

## 2023-12-28 NOTE — Progress Notes (Signed)
 EPIC Encounter for ICM Monitoring  Patient Name: [name] is a 58 y.o. male Date: 12/28/2023 Primary Care Physican: Vicci Barnie NOVAK, MD Primary Cardiologist: Crenshaw/[name] Electrophysiologist: [name] 08/24/2023 Office Weight: 295 lbs    09/28/2023 Weight: 291 lbs       10/06/2023 Weight: unknown lbs   10/11/2023 Weight: 279.5 lbs hospital weight 10/12/2023 Weight: 279.9 lbs hospital weight 10/13/2023  Weight: 280.2 lbs discharge weight 10/20/2023 Weight:  Not weighing at home    10/23/2023 Weight: Pt reports 5/27 he thinks he was 285 lbs at home 11/03/2023 Weight: 294 lbs (10 lb weight gain since 5/16) 12/15/2023 Office Weight: 289 lbs   Transmission results reviewed.   Currently hospitalized on 7/21   [place] thoracic impedance suggesting normal fluid levels since 6/20.   Prescribed dosage:  Torsemide  20 mg take 2 tablet(s) (40 mg total) by mouth daily.  Potassium 20 mEq take 2 tablet(s) (40 mEq total) by mouth daily.  Metolazone  5 mg take 1 tablet by mouth once a week.  Pt takes on Mondays (Nephrologist said he could take it up to twice a week).     Labs: 12/28/2023 Creatinine 2.68, BUN 52, Potassium 3.4, Sodium 140, GFR 27  12/27/2023 Creatinine 2.69, BUN 54, Potassium 3.7, Sodium 136, GFR 26  12/26/2023 Creatinine 2.61, BUN 57, Potassium 3.3, Sodium 138, GFR 27  12/25/2023 Creatinine 2.57, BUN 57, Potassium 3.3, Sodium 138, GFR 28 12/24/2023 Creatinine 2.49, BUN 56, Potassium 3.8, Sodium 137, GFR 29  12/23/2023 Creatinine 2.66, BUN 65, Potassium 3.4, Sodium 138, GFR 27  12/22/2023 Creatinine 2.84, BUN 64, Potassium 3.7, Sodium 135, GFR 25  12/21/2023 Creatinine 3.00, BUN 62, Potassium 3.5, Sodium 134, GFR 23  12/19/2023 Creatinine 3.27, BUN 38, Potassium 3.0, Sodium 137, GFR 21 A complete set of results can be found in Results Review.   Recommendations:   None    Follow-up plan: ICM clinic phone appointment on 01/11/2024 to recheck post hospitalization.   91 day device  clinic remote transmission 02/17/2024.     EP/Cardiology Office Visits:  Recall 11/02/2023 with Dr [name] or [name], PA (6 month).  01/05/2024 with [facility] for hospital f/u.   Copy of ICM check sent to Dr [name].   3 month ICM trend: 12/28/2023.    12-14 Month ICM trend:     [name], RN 12/28/2023 7:56 AM

## 2023-12-28 NOTE — Plan of Care (Signed)
   Problem: Education: Goal: Knowledge of General Education information will improve Description: Including pain rating scale, medication(s)/side effects and non-pharmacologic comfort measures Outcome: Progressing   Problem: Clinical Measurements: Goal: Cardiovascular complication will be avoided Outcome: Progressing

## 2023-12-28 NOTE — Progress Notes (Signed)
 No ICM remote transmission received for 12/28/2023 due to patient currently hospitalized and next ICM transmission scheduled for 01/05/2024.

## 2023-12-28 NOTE — Progress Notes (Addendum)
 PROGRESS NOTE    CARSEN LEAF  FMW:[id number] DOB: 17-Nov-1965 DOA: 12/19/2023 PCP: Vicci Barnie NOVAK, MD   58/M w cardiac arrest status post AICD placement, NICM, atrial fibrillation on anticoagulation, T2DM, stage IIIb CKD  who presented to the ED on 12/19/2023 with worsening shortness of breath over the last couple of days. Work up in the ED included nonacute CXR, admitted with CHF exacerbation  - Cards following, improved with diuresis - 7/18, discharge completed,, cards signed off,  however subsequently reported ongoing left-sided chest pain, x-ray and troponins unremarkable - 7/19 AM, VQ scan ordered - 7/20, VQ scan completed, low probability, noncontrast CT also unremarkable  Subjective: - Continues to have severe left-sided chest pain, 10 out of 10, sharp, with pleuritic component  Assessment and Plan:  Acute on chronic systolic CHF -Repeat echo with EF 40-45%, moderate LVH, grade 1 DD, normal RV -GDMT limited by CKD - Improved with diuresis, 14 L negative - Now euvolemic, transitioned to oral torsemide , continue Toprol  -GDMT limited by CKD  Left-sided pleuritic chest pain - New onset since 7/18, chest x-ray negative, troponins flat - Remains on PPI, added Carafate  as well, does not appear to be GI - Despite anticoagulation with apixaban  for A-fib, given the severity of his symptoms I ordered a VQ scan, after multiple requests was finally completed yesterday and low probability of PE -CT chest noncontrast also unremarkable -2D echo last week with stable cardiomyopathy, no pericardial effusion - DC IV heparin , restart Eliquis  - He does not have tenderness to suggest costochondritis, ?pleurisy, CRP elevated, ESR mildly up, trial of Iv steroids today, will ask for Pulm input  Esophageal dysphagia - History of aspiration like episode prior to admission - Continue PPI, refer to GI - continue carafate   Paroxysmal atrial fibrillation - Currently in sinus rhythm, continue  amiodarone , Eliquis   History of VT Continue amiodarone    OSA: intolerant even of new CPAP machine at home  - CPAP nightly   AKI on stage IIIb CKD: SCr on admission is 3.2 from baseline 2.7 - Improved and stable now at baseline of 2.5-2.7   IDT2DM with steroid induced hyperglycemia:  - Continue Semglee  - FU with endocrinology, Dr. Trixie.    HLD:  - Continue statin   Depression:  - Continue SSRI   BPH:  - Continue tamsulosin     DVT prophylaxis: Apixaban  Code Status: DNR Family Communication: brother at bedside yesterday Disposition Plan: Home pending improvement in symptoms  Consultants:    Procedures:   Antimicrobials:    Objective: Vitals:   12/28/23 0025 12/28/23 0255 12/28/23 0607 12/28/23 0748  BP: 133/79 125/68  134/76  Pulse: 74 80  87  Resp: 18 18  19   Temp: 98.8 F (37.1 C) 98.7 F (37.1 C)  99.5 F (37.5 C)  TempSrc: Oral Oral  Oral  SpO2: 95% 98%  93%  Weight:   129.1 kg   Height:        Intake/Output Summary (Last 24 hours) at 12/28/2023 1119 Last data filed at 12/28/2023 9342 Gross per 24 hour  Intake 524.51 ml  Output 1925 ml  Net -1400.49 ml   Filed Weights   12/27/23 0430 12/27/23 1100 12/28/23 0607  Weight: 129.4 kg 129.4 kg 129.1 kg    Examination:  General exam: He is chronically ill, AAO x 3, uncomfortable appearing HEENT: Neck obese unable to assess JVD CVS: S1-S2, regular rhythm Lungs: Poor air movement otherwise clear Abdomen: Soft, nontender, bowel sounds present Extremities: no edema Skin: No  rashes Psychiatry: Flat affect, uncomfortable    Data Reviewed:   CBC: Recent Labs  Lab 12/27/23 0851 12/28/23 0302  WBC 10.0 11.0*  HGB 14.3 14.3  HCT 43.3 43.9  MCV 93.3 94.4  PLT 274 274   Basic Metabolic Panel: Recent Labs  Lab 12/21/23 1154 12/22/23 0250 12/23/23 0227 12/24/23 0230 12/25/23 0204 12/26/23 0228 12/27/23 0851 12/28/23 0302  NA 134* 135 138 137 138 138 136 140  K 3.5 3.7 3.4* 3.8 3.3*  3.3* 3.7 3.4*  CL 90* 94* 96* 99 96* 99 97* 95*  CO2 29 26 28 24 29 29 25 30   GLUCOSE 258* 328* 249* 181* 156* 118* 184* 155*  BUN 62* 64* 65* 56* 57* 57* 54* 52*  CREATININE 3.00* 2.84* 2.66* 2.49* 2.57* 2.61* 2.69* 2.68*  CALCIUM  9.6 9.6 9.9 9.6 9.3 9.1 9.2 9.3  MG 2.0 2.2  --  2.3 2.1  --   --   --   PHOS  --   --  4.1  --   --   --   --   --    GFR: Estimated Creatinine Clearance: 41.8 mL/min (A) (by C-G formula based on SCr of 2.68 mg/dL (H)). Liver Function Tests: Recent Labs  Lab 12/23/23 0227 12/27/23 0851  AST  --  37  ALT  --  47*  ALKPHOS  --  81  BILITOT  --  0.7  PROT  --  8.1  ALBUMIN  3.7 3.9   No results for input(s): LIPASE, AMYLASE in the last 168 hours. No results for input(s): AMMONIA in the last 168 hours. Coagulation Profile: No results for input(s): INR, PROTIME in the last 168 hours. Cardiac Enzymes: No results for input(s): CKTOTAL, CKMB, CKMBINDEX, TROPONINI in the last 168 hours. BNP (last 3 results) No results for input(s): PROBNP in the last 8760 hours. HbA1C: No results for input(s): HGBA1C in the last 72 hours. CBG: Recent Labs  Lab 12/27/23 1533 12/27/23 2057 12/28/23 0024 12/28/23 0250 12/28/23 0620  GLUCAP 201* 273* 205* 128* 149*   Lipid Profile: No results for input(s): CHOL, HDL, LDLCALC, TRIG, CHOLHDL, LDLDIRECT in the last 72 hours. Thyroid  Function Tests: No results for input(s): TSH, T4TOTAL, FREET4, T3FREE, THYROIDAB in the last 72 hours. Anemia Panel: No results for input(s): VITAMINB12, FOLATE, FERRITIN, TIBC, IRON, RETICCTPCT in the last 72 hours. Urine analysis:    Component Value Date/Time   COLORURINE STRAW (A) 10/08/2023 0952   APPEARANCEUR CLEAR 10/08/2023 0952   APPEARANCEUR Clear 07/17/2020 1655   LABSPEC 1.009 10/08/2023 0952   PHURINE 5.0 10/08/2023 0952   GLUCOSEU 50 (A) 10/08/2023 0952   HGBUR NEGATIVE 10/08/2023 0952   BILIRUBINUR NEGATIVE  10/08/2023 0952   BILIRUBINUR negative 01/30/2022 1705   BILIRUBINUR Negative 07/17/2020 1655   KETONESUR NEGATIVE 10/08/2023 0952   PROTEINUR NEGATIVE 10/08/2023 0952   UROBILINOGEN 0.2 01/30/2022 1705   UROBILINOGEN 1.0 06/20/2013 1032   NITRITE NEGATIVE 10/08/2023 0952   LEUKOCYTESUR NEGATIVE 10/08/2023 0952   Sepsis Labs: @LABRCNTIP (procalcitonin:4,lacticidven:4)  )No results found for this or any previous visit (from the past 240 hours).   Radiology Studies: NM Pulmonary Perfusion Result Date: 12/27/2023 CLINICAL DATA:  Chest pain. EXAM: NUCLEAR MEDICINE PERFUSION LUNG SCAN TECHNIQUE: Perfusion images were obtained in multiple projections after intravenous injection of radiopharmaceutical. Ventilation scans intentionally deferred if perfusion scan and chest x-ray adequate for interpretation during COVID 19 epidemic. RADIOPHARMACEUTICALS:  4.4 mCi Tc-6m MAA IV COMPARISON:  Chest radiograph, 12/25/2023.  Chest CT, 12/27/2023. FINDINGS: There  are no areas of abnormal perfusion. Specifically, there are no segmental defects to suggest pulmonary thromboembolism. IMPRESSION: Normal exam. Electronically Signed   By: Alm Parkins M.D.   On: 12/27/2023 15:43   CT CHEST WO CONTRAST Result Date: 12/27/2023 CLINICAL DATA:  Nonspecific chest pain EXAM: CT CHEST WITHOUT CONTRAST TECHNIQUE: Multidetector CT imaging of the chest was performed following the standard protocol without IV contrast. RADIATION DOSE REDUCTION: This exam was performed according to the departmental dose-optimization program which includes automated exposure control, adjustment of the mA and/or kV according to patient size and/or use of iterative reconstruction technique. COMPARISON:  None Available. FINDINGS: Cardiovascular: Left subclavian dual lead pacemaker in place with leads within the right atrium and right ventricle. No significant coronary artery calcification. Global cardiac size ismildly enlarged. No pericardial effusion.  Central pulmonary arteries are of normal caliber. No significant atherosclerotic calcification within the thoracic aorta. No aortic aneurysm. Mediastinum/Nodes: No enlarged mediastinal or axillary lymph nodes. Thyroid  gland, trachea, and esophagus demonstrate no significant findings. Tiny hiatal hernia Lungs/Pleura: Lungs are clear. No pleural effusion or pneumothorax. Upper Abdomen: 15 mm right and 20 mm left adrenal nodules are again identified and are stable since remote prior examination of 09/17/2017, safely considered benign and likely representing benign adrenal adenoma given their stability over time. No acute abnormality. Musculoskeletal: No chest wall mass or suspicious bone lesions identified. IMPRESSION: 1. No acute intrathoracic findings. 2. Mild cardiomegaly. 3. Tiny hiatal hernia. 4. 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 Electronically Signed   By: Dorethia Molt M.D.   On: 12/27/2023 10:23      Scheduled Meds:  amiodarone   400 mg Oral Daily   atorvastatin   10 mg Oral Daily   clonazePAM   0.5 mg Oral BID   gabapentin   300 mg Oral QHS   insulin  aspart  0-20 Units Subcutaneous TID WC   insulin  aspart  0-5 Units Subcutaneous QHS   insulin  aspart  6 Units Subcutaneous TID WC   insulin  glargine-yfgn  70 Units Subcutaneous Daily   methylPREDNISolone  (SOLU-MEDROL ) injection  40 mg Intravenous Q12H   metoprolol  succinate  12.5 mg Oral Daily   potassium chloride  SA  40 mEq Oral Daily   sertraline   75 mg Oral q AM   sodium chloride  flush  3 mL Intravenous Q12H   sucralfate   1 g Oral TID WC & HS   tamsulosin   0.8 mg Oral Daily   timolol   1 drop Both Eyes q morning   torsemide   40 mg Oral Daily   Continuous Infusions:  heparin  1,950 Units/hr (12/28/23 0305)     LOS: 9 days    Time spent:    Sigurd Pac, MD [facility]   12/28/2023, 11:19 AM

## 2023-12-28 NOTE — Plan of Care (Addendum)
 Left-sided pleuritic chest pain Patient complaining about 10 out of 10 left-sided chest pain with movement and while talking.  Otherwise hemodynamically stable.  Patient has left-sided pleuritic chest pain in the setting of CHF.  CT chest no evidence of pneumothorax and NMN study ruled out pulmonary embolism. -EKG showed normal sinus rhythm heart rate 84, right axis deviation. nonspecific T wave abnormality which is unchanged compared to previous EKG.   -Checking troponin. -Patient is already on oxycodone  and Dilaudid  as needed for pain control.  Update, troponin 65.  Will follow-up with second troponin level.  However EKG is reassuring.  At this time not concern for cardiac origin chest pain rather than demand ischemia in the setting of CHF exacerbation.  Sloan Takagi, MD [facility] 12/28/2023

## 2023-12-28 NOTE — Progress Notes (Signed)
 Mobility Specialist Progress Note:    12/28/23 1150  Mobility  Activity Stood at bedside;Dangled on edge of bed (EOB, 2x STS, 5x Bi Leg Ext)  Level of Assistance Independent  Assistive Device None  Range of Motion/Exercises Active  Activity Response Tolerated fair  Mobility Referral Yes  Mobility visit 1 Mobility  Mobility Specialist Start Time (ACUTE ONLY) 1150  Mobility Specialist Stop Time (ACUTE ONLY) 1155  Mobility Specialist Time Calculation (min) (ACUTE ONLY) 5 min   Received pt on EOB w/ RN. Originally wasn't agreeable to session but was convinced to do a couple of exercises (2x STS, 5x Bi leg ext). Pt c/o of chest pain and lower back pain. Wants to try and do more tomorrow depending on pain. Left pt on EOB w/ RN.   Venetia Keel Mobility Specialist Please Neurosurgeon or Rehab Office at [phone]

## 2023-12-28 NOTE — Consult Note (Signed)
 NAME:  [name], MRN:  [id number], DOB:  1966-02-13, LOS: 9 ADMISSION DATE:  12/19/2023, CONSULTATION DATE:  12/28/2023 REFERRING MD:  SHAUNNA Pac MD, CHIEF COMPLAINT:  Chest pain   History of Present Illness:   The patient, with COPD,asthma, OSA, CAD, cardiomyopathy and atrial fibrillation, presents with shortness of breath and heart failure. She has been diuresed adequately and cardiology signed off. PCCM consulted for new onset chest pain for 3 days  Chest pain - Severe, left-sided chest pain for three days - Pain described as 'unbearable' and prevents daily activities, including getting up to go to the bathroom - Radiates to the back - Exacerbated by movement and coughing - No recent falls or trauma - Denies association with heartburn  Dyspnea and wheezing - Shortness of breath ongoing, with slight improvement after diuresis for heart failure and volume overload - Wheezing present for the past few weeks - Unable to tolerate CPAP for severe sleep apnea at home or in clinic - Currently using Symbicort  inhaler as outpatient; started on Breo during this visit  Cough and sputum production - Coughing up thick brown matter following an episode of choking on water, which preceded current symptoms  Gastroesophageal reflux disease - Currently on famotidine  for acid reflux, but has not received it during this hospital stay - Previously on Protonix , but it is not currently ordered  Pertinent  Medical History    has a past medical history of AICD (automatic cardioverter/defibrillator) present, Chronic bronchitis (HCC), Chronic systolic (congestive) heart failure (HCC), Fracture of left humerus, GERD (gastroesophageal reflux disease), Heart murmur, History of renal calculi, Hypertension, Kidney stones, NICM (nonischemic cardiomyopathy) (HCC), Obesity, 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   Significant Hospital Events: Including procedures, antibiotic start and stop dates in addition to other pertinent events     Interim History / Subjective:    Objective    Blood pressure 133/79, pulse 84, temperature 98.8 F (37.1 C), temperature source Oral, resp. rate 20, height 6' 0.99 (1.854 m), weight 129.1 kg, SpO2 93%.        Intake/Output Summary (Last 24 hours) at 12/28/2023 1241 Last data filed at 12/28/2023 0657 Gross per 24 hour  Intake 524.51 ml  Output 1925 ml  Net -1400.49 ml   Filed Weights   12/27/23 0430 12/27/23 1100 12/28/23 0607  Weight: 129.4 kg 129.4 kg 129.1 kg    Examination: Blood pressure 133/79, pulse 84, temperature 98.8 F (37.1 C), temperature source Oral, resp. rate 20, height 6' 0.99 (1.854 m), weight 129.1 kg, SpO2 93%. Gen:      No acute distress, obese HEENT:  EOMI, sclera anicteric Neck:     No masses; no thyromegaly Lungs:    Mild exp wheeze CV:         Regular rate and rhythm; no murmurs Abd:      + bowel sounds; soft, non-tender; no palpable masses, no distension Ext:    No edema; adequate peripheral perfusion Neuro: alert and oriented x 3 Psych: normal mood and affect   Labs/Imaging reviewed Significant for potassium 3.4, BUN/creatinine 52/2.68 WBC 11 VQ scan in 7/20-negative for PE CT scan on 7/20-with no significant intrathoracic abnormalities  Resolved problem list   Assessment and Plan  Chest Pain Severe chest pain for the past three days, described as unbearable and radiating to the back. Pain is reproducible on deep palpation.  Negative troponins and EKGs ruled out cardiac origin. Negative for PE on VQ scan and CT chest is unremarkable. Differential includes musculoskeletal pain. May have component of GERD or COPD exacerbation as he has mild wheeze - Continue pain medication - Consider topical pain medication such as a patch. - Ensure administration  of Protonix  and sucralfate  for acid reflux.  Chronic Obstructive Pulmonary Disease (COPD) COPD with exacerbation characterized by wheezing and shortness of breath. Currently on Breo in the hospital, similar to outpatient Symbicort . - Administer nebulizers including Brovana , Pulmicort , and Yupelri . - Agree with IV steroids that were started today  Severe Sleep Apnea Recently diagnosed severe sleep apnea with difficulty adjusting to CPAP machine, not currently using it.  Heart Failure with Volume Overload Atrial Fibrillation Recent diuresis performed with slight improvement in breathing Atrial fibrillation managed with Eliquis . No missed doses reported, reducing the risk of thromboembolic events.  Acid Reflux Acid reflux managed with Protonix  and sucralfate . Reports not receiving famotidine  in the hospital. - Ensure administration of Protonix  and sucralfate .  Best Practice (right click and Reselect all SmartList Selections daily)   Per primary team  Signature:   Raylon Lamson MD [facility] See Amion for pager  If no response to pager , please call [phone] until 7pm After 7:00 pm call Elink  [phone] 12/28/2023, 2:07 PM

## 2023-12-29 ENCOUNTER — Inpatient Hospital Stay (HOSPITAL_COMMUNITY)

## 2023-12-29 DIAGNOSIS — R609 Edema, unspecified: Secondary | ICD-10-CM

## 2023-12-29 DIAGNOSIS — J441 Chronic obstructive pulmonary disease with (acute) exacerbation: Secondary | ICD-10-CM | POA: Diagnosis not present

## 2023-12-29 DIAGNOSIS — I4891 Unspecified atrial fibrillation: Secondary | ICD-10-CM | POA: Diagnosis not present

## 2023-12-29 DIAGNOSIS — G4733 Obstructive sleep apnea (adult) (pediatric): Secondary | ICD-10-CM | POA: Diagnosis not present

## 2023-12-29 DIAGNOSIS — R079 Chest pain, unspecified: Secondary | ICD-10-CM | POA: Diagnosis not present

## 2023-12-29 LAB — CBC
HCT: 36.6 % — ABNORMAL LOW (ref 39.0–52.0)
Hemoglobin: 12.2 g/dL — ABNORMAL LOW (ref 13.0–17.0)
MCH: 31 pg (ref 26.0–34.0)
MCHC: 33.3 g/dL (ref 30.0–36.0)
MCV: 93.1 fL (ref 80.0–100.0)
Platelets: 270 K/uL (ref 150–400)
RBC: 3.93 MIL/uL — ABNORMAL LOW (ref 4.22–5.81)
RDW: 15 % (ref 11.5–15.5)
WBC: 15.1 K/uL — ABNORMAL HIGH (ref 4.0–10.5)
nRBC: 0 % (ref 0.0–0.2)

## 2023-12-29 LAB — BASIC METABOLIC PANEL WITH GFR
Anion gap: 11 (ref 5–15)
BUN: 51 mg/dL — ABNORMAL HIGH (ref 6–20)
CO2: 23 mmol/L (ref 22–32)
Calcium: 8.7 mg/dL — ABNORMAL LOW (ref 8.9–10.3)
Chloride: 100 mmol/L (ref 98–111)
Creatinine, Ser: 2.81 mg/dL — ABNORMAL HIGH (ref 0.61–1.24)
GFR, Estimated: 25 mL/min — ABNORMAL LOW (ref >60)
Glucose, Bld: 274 mg/dL — ABNORMAL HIGH (ref 70–99)
Potassium: 4 mmol/L (ref 3.5–5.1)
Sodium: 134 mmol/L — ABNORMAL LOW (ref 135–145)

## 2023-12-29 LAB — GLUCOSE, CAPILLARY
Glucose-Capillary: 300 mg/dL — ABNORMAL HIGH (ref 70–99)
Glucose-Capillary: 320 mg/dL — ABNORMAL HIGH (ref 70–99)
Glucose-Capillary: 297 mg/dL — ABNORMAL HIGH (ref 70–99)
Glucose-Capillary: 270 mg/dL — ABNORMAL HIGH (ref 70–99)

## 2023-12-29 LAB — URIC ACID: Uric Acid, Serum: 14.2 mg/dL — ABNORMAL HIGH (ref 3.7–8.6)

## 2023-12-29 MED ORDER — INSULIN GLARGINE-YFGN 100 UNIT/ML ~~LOC~~ SOLN
50.0000 [IU] | Freq: Two times a day (BID) | SUBCUTANEOUS | Status: DC
  Administered 2023-12-29 (×2): 50 [IU] via SUBCUTANEOUS
  Filled 2023-12-29 (×4): qty 0.5

## 2023-12-29 MED ORDER — TORSEMIDE 20 MG PO TABS
20.0000 mg | ORAL_TABLET | Freq: Every day | ORAL | Status: DC
  Administered 2023-12-30: 20 mg via ORAL
  Filled 2023-12-29: qty 1

## 2023-12-29 MED ORDER — INSULIN ASPART 100 UNIT/ML IJ SOLN
8.0000 [IU] | Freq: Three times a day (TID) | INTRAMUSCULAR | Status: DC
  Administered 2023-12-29 – 2024-01-05 (×19): 8 [IU] via SUBCUTANEOUS

## 2023-12-29 NOTE — Progress Notes (Signed)
 LUE venous exam is completed. Trudy Kory, RVT

## 2023-12-29 NOTE — Plan of Care (Signed)
  Problem: Coping: Goal: Ability to adjust to condition or change in health will improve Outcome: Progressing   Problem: Health Behavior/Discharge Planning: Goal: Ability to manage health-related needs will improve Outcome: Progressing   Problem: Education: Goal: Knowledge of General Education information will improve Description: Including pain rating scale, medication(s)/side effects and non-pharmacologic comfort measures Outcome: Progressing   Problem: Clinical Measurements: Goal: Ability to maintain clinical measurements within normal limits will improve Outcome: Progressing Goal: Will remain free from infection Outcome: Progressing   Problem: Pain Managment: Goal: General experience of comfort will improve and/or be controlled Outcome: Progressing

## 2023-12-29 NOTE — Progress Notes (Signed)
 NAME:  [name], MRN:  [id number], DOB:  [birth year], LOS: 10 ADMISSION DATE:  12/19/2023, CONSULTATION DATE:  12/28/2023 REFERRING MD:  SHAUNNA Pac MD, CHIEF COMPLAINT:  Chest pain   History of Present Illness:   The patient, with COPD,asthma, OSA, CAD, cardiomyopathy and atrial fibrillation, presents with shortness of breath and heart failure. She has been diuresed adequately and cardiology signed off. PCCM consulted for new onset chest pain for 3 days  Chest pain - Severe, left-sided chest pain for three days - Pain described as 'unbearable' and prevents daily activities, including getting up to go to the bathroom - Radiates to the back - Exacerbated by movement and coughing - No recent falls or trauma - Denies association with heartburn  Dyspnea and wheezing - Shortness of breath ongoing, with slight improvement after diuresis for heart failure and volume overload - Wheezing present for the past few weeks - Unable to tolerate CPAP for severe sleep apnea at home or in clinic - Currently using Symbicort  inhaler as outpatient; started on Breo during this visit  Cough and sputum production - Coughing up thick brown matter following an episode of choking on water, which preceded current symptoms  Gastroesophageal reflux disease - Currently on famotidine  for acid reflux, but has not received it during this hospital stay - Previously on Protonix , but it is not currently ordered  Pertinent  Medical History    has a past medical history of AICD (automatic cardioverter/defibrillator) present, Chronic bronchitis (HCC), Chronic systolic (congestive) heart failure (HCC), Fracture of left humerus, GERD (gastroesophageal reflux disease), Heart murmur, History of renal calculi, Hypertension, Kidney stones, NICM (nonischemic cardiomyopathy) (HCC), Obesity, 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   Significant Hospital Events: Including procedures, antibiotic start and stop dates in addition to other pertinent events   7/12 Admit 7/21 PCCM consult  Interim History / Subjective:   Continues to complain of left chest pain radiating to the back.  Reproducible on deep palpation on the left back chest wall Frustrated that we do not have any explanation for his chest pain  Objective    Blood pressure 121/67, pulse 73, temperature 98.1 F (36.7 C), temperature source Oral, resp. rate 19, height 6' 0.99 (1.854 m), weight 130.1 kg, SpO2 92%.        Intake/Output Summary (Last 24 hours) at 12/29/2023 0806 Last data filed at 12/29/2023 0700 Gross per 24 hour  Intake --  Output 3000 ml  Net -3000 ml   Filed Weights   12/27/23 1100 12/28/23 0607 12/29/23 0457  Weight: 129.4 kg 129.1 kg 130.1 kg    Examination: Gen:      No acute distress HEENT:  EOMI, sclera anicteric Neck:     No masses; no thyromegaly Lungs:  Wheezing improved CV:         Regular rate and rhythm; no murmurs Abd:      + bowel sounds; soft, non-tender; no palpable masses, no distension Ext:    No edema; adequate peripheral perfusion Neuro: alert and oriented x 3 Psych: normal mood and affect   Labs/Imaging reviewed VQ scan in 7/20-negative for PE CT scan on 7/20-with no significant intrathoracic abnormalities  Resolved problem list   Assessment and Plan  Chest Pain Severe chest pain for the past three days, described as unbearable and radiating to the back. Pain is reproducible on deep palpation. Negative  troponins and EKGs ruled out cardiac origin. Negative for PE on VQ scan and CT chest is unremarkable. Differential includes musculoskeletal pain. May have component of GERD or COPD exacerbation as he has mild wheeze - Continue pain medication, lidocaine  patch.  Add scheduled ibuprofen - Ensure administration of Protonix  and sucralfate   for acid reflux.  Add Pepcid   Chronic Obstructive Pulmonary Disease (COPD) COPD with exacerbation characterized by wheezing and shortness of breath. Currently on Breo in the hospital, similar to outpatient Symbicort . - Administer nebulizers including Brovana , Pulmicort , and Yupelri . - IV Solu-Medrol   Left arm DVT Left arm DVT in Nov 2004 and is on eliquis . Repeat US  in July 2025 showed DVT is gone but has a superficial thrombus. Can consider repeat ultrasound of Left upper extremity though suspicion is low.   Severe Sleep Apnea Recently diagnosed severe sleep apnea with difficulty adjusting to CPAP machine, not currently using it.  Heart Failure with Volume Overload Atrial Fibrillation Recent diuresis performed with slight improvement in breathing Atrial fibrillation managed with Eliquis . No missed doses reported, reducing the risk of thromboembolic events.  Acid Reflux Acid reflux managed with Protonix  and sucralfate . Reports not receiving famotidine  in the hospital. - Ensure administration of Protonix  and sucralfate . - Add pepcid  at night  Best Practice (right click and Reselect all SmartList Selections daily)   Per primary team  Signature:   Brinda Focht MD [facility] See Amion for pager  If no response to pager , please call [phone] until 7pm After 7:00 pm call Elink  [phone] 12/29/2023, 8:06 AM

## 2023-12-29 NOTE — Progress Notes (Addendum)
 PROGRESS NOTE    [name]  FMW:[id number] DOB: [birth year] DOA: 12/19/2023 PCP: Vicci Barnie NOVAK, MD   58/M w cardiac arrest status post AICD placement, NICM, atrial fibrillation on anticoagulation, T2DM, stage IIIb CKD  who presented to the ED on 12/19/2023 with worsening shortness of breath over the last couple of days. Work up in the ED included nonacute CXR, admitted with CHF exacerbation  - Cards following, improved with diuresis - 7/18, discharge completed,, cards signed off,  however subsequently reported left-sided chest pain, x-ray and troponins unremarkable - 7/19 AM, VQ scan ordered - 7/20, worsening chest pain, left-sided, pleuritic >VQ scan completed, low probability, noncontrast CT also unremarkable - 7/21, CRP elevated, started on IV steroids,?  Pleurisy, seen by pulmonary> suspected to be musculoskeletal  Subjective: - Continues to have severe left-sided chest pain, now radiating to left shoulder and upper back  Assessment and Plan:  Acute on chronic systolic CHF -Repeat echo with EF 40-45%, moderate LVH, grade 1 DD, normal RV -GDMT limited by CKD - Improved with diuresis, 14 L negative - Now euvolemic, transitioned to oral torsemide , continue Toprol , now p.o. intake is poor will decrease torsemide  dose -GDMT limited by CKD  Left-sided pleuritic chest pain - New onset since 7/18, chest x-ray negative, troponins flat - Despite anticoagulation with apixaban  for A-fib, given the severity of his symptoms I ordered a VQ scan, >low probability of PE -CT chest noncontrast also unremarkable -2D echo last week with stable cardiomyopathy, no pericardial effusion - He does not have tenderness to suggest costochondritis anteriorly however now with some posterior upper back/shoulder pain and tenderness - Started him on steroids yesterday for suspicion of pleurisy, CRP moderately elevated, ESR mildly up, now I suspect a musculoskeletal component ;continue IV steroids today,  heating pad, will obtain CT left shoulder  Esophageal dysphagia - History of aspiration like episode prior to admission - Continue PPI, refer to GI - continue carafate   Paroxysmal atrial fibrillation - Currently in sinus rhythm, continue amiodarone , Eliquis   History of VT Continue amiodarone    OSA: intolerant even of new CPAP machine at home  - CPAP nightly   AKI on stage IIIb CKD: SCr on admission is 3.2 from baseline 2.7 - Improved and stable now at baseline of 2.5-2.7   IDT2DM with steroid induced hyperglycemia:  - Continue Semglee  - FU with endocrinology, Dr. Trixie.    HLD:  - Continue statin   Depression:  - Continue SSRI   BPH:  - Continue tamsulosin     DVT prophylaxis: Apixaban  Code Status: DNR Family Communication: brother at bedside this weekend Disposition Plan: Home pending improvement in symptoms  Consultants:    Procedures:   Antimicrobials:    Objective: Vitals:   12/29/23 0500 12/29/23 0734 12/29/23 0827 12/29/23 0934  BP: 121/67 (!) 130/96  119/68  Pulse: 73 73 74 71  Resp:  20 (!) 21   Temp:  97.7 F (36.5 C)    TempSrc:  Oral    SpO2: 92% 95% 96%   Weight:      Height:        Intake/Output Summary (Last 24 hours) at 12/29/2023 1155 Last data filed at 12/29/2023 0900 Gross per 24 hour  Intake 236 ml  Output 3000 ml  Net -2764 ml   Filed Weights   12/27/23 1100 12/28/23 0607 12/29/23 0457  Weight: 129.4 kg 129.1 kg 130.1 kg    Examination:  General exam: He is chronically ill, AAO x 3, uncomfortable appearing HEENT: Neck  obese unable to assess JVD CVS: S1-S2, regular rhythm Lungs: Poor air movement otherwise clear Abdomen: Soft, nontender, bowel sounds present Extremities: Left shoulder pain and tenderness, limited range of motion, tenderness in left posterior/upper back Skin: No rashes Psychiatry: Flat affect, uncomfortable    Data Reviewed:   CBC: Recent Labs  Lab 12/27/23 0851 12/28/23 0302 12/29/23 0143   WBC 10.0 11.0* 15.1*  HGB 14.3 14.3 12.2*  HCT 43.3 43.9 36.6*  MCV 93.3 94.4 93.1  PLT 274 274 270   Basic Metabolic Panel: Recent Labs  Lab 12/23/23 0227 12/24/23 0230 12/25/23 0204 12/26/23 0228 12/27/23 0851 12/28/23 0302 12/29/23 0143  NA 138 137 138 138 136 140 134*  K 3.4* 3.8 3.3* 3.3* 3.7 3.4* 4.0  CL 96* 99 96* 99 97* 95* 100  CO2 28 24 29 29 25 30 23   GLUCOSE 249* 181* 156* 118* 184* 155* 274*  BUN 65* 56* 57* 57* 54* 52* 51*  CREATININE 2.66* 2.49* 2.57* 2.61* 2.69* 2.68* 2.81*  CALCIUM  9.9 9.6 9.3 9.1 9.2 9.3 8.7*  MG  --  2.3 2.1  --   --   --   --   PHOS 4.1  --   --   --   --   --   --    GFR: Estimated Creatinine Clearance: 40 mL/min (A) (by C-G formula based on SCr of 2.81 mg/dL (H)). Liver Function Tests: Recent Labs  Lab 12/23/23 0227 12/27/23 0851  AST  --  37  ALT  --  47*  ALKPHOS  --  81  BILITOT  --  0.7  PROT  --  8.1  ALBUMIN  3.7 3.9   No results for input(s): LIPASE, AMYLASE in the last 168 hours. No results for input(s): AMMONIA in the last 168 hours. Coagulation Profile: No results for input(s): INR, PROTIME in the last 168 hours. Cardiac Enzymes: No results for input(s): CKTOTAL, CKMB, CKMBINDEX, TROPONINI in the last 168 hours. BNP (last 3 results) No results for input(s): PROBNP in the last 8760 hours. HbA1C: No results for input(s): HGBA1C in the last 72 hours. CBG: Recent Labs  Lab 12/28/23 1143 12/28/23 1614 12/28/23 2058 12/29/23 0646 12/29/23 1051  GLUCAP 269* 249* 244* 300* 320*   Lipid Profile: No results for input(s): CHOL, HDL, LDLCALC, TRIG, CHOLHDL, LDLDIRECT in the last 72 hours. Thyroid  Function Tests: No results for input(s): TSH, T4TOTAL, FREET4, T3FREE, THYROIDAB in the last 72 hours. Anemia Panel: No results for input(s): VITAMINB12, FOLATE, FERRITIN, TIBC, IRON, RETICCTPCT in the last 72 hours. Urine analysis:    Component Value Date/Time    COLORURINE STRAW (A) 10/08/2023 0952   APPEARANCEUR CLEAR 10/08/2023 0952   APPEARANCEUR Clear 07/17/2020 1655   LABSPEC 1.009 10/08/2023 0952   PHURINE 5.0 10/08/2023 0952   GLUCOSEU 50 (A) 10/08/2023 0952   HGBUR NEGATIVE 10/08/2023 0952   BILIRUBINUR NEGATIVE 10/08/2023 0952   BILIRUBINUR negative 01/30/2022 1705   BILIRUBINUR Negative 07/17/2020 1655   KETONESUR NEGATIVE 10/08/2023 0952   PROTEINUR NEGATIVE 10/08/2023 0952   UROBILINOGEN 0.2 01/30/2022 1705   UROBILINOGEN 1.0 06/20/2013 1032   NITRITE NEGATIVE 10/08/2023 0952   LEUKOCYTESUR NEGATIVE 10/08/2023 0952   Sepsis Labs: @LABRCNTIP (procalcitonin:4,lacticidven:4)  )No results found for this or any previous visit (from the past 240 hours).   Radiology Studies: NM Pulmonary Perfusion Result Date: 12/27/2023 CLINICAL DATA:  Chest pain. EXAM: NUCLEAR MEDICINE PERFUSION LUNG SCAN TECHNIQUE: Perfusion images were obtained in multiple projections after intravenous injection of radiopharmaceutical. Ventilation scans  intentionally deferred if perfusion scan and chest x-ray adequate for interpretation during COVID 19 epidemic. RADIOPHARMACEUTICALS:  4.4 mCi Tc-59m MAA IV COMPARISON:  Chest radiograph, 12/25/2023.  Chest CT, 12/27/2023. FINDINGS: There are no areas of abnormal perfusion. Specifically, there are no segmental defects to suggest pulmonary thromboembolism. IMPRESSION: Normal exam. Electronically Signed   By: Alm Parkins M.D.   On: 12/27/2023 15:43      Scheduled Meds:  amiodarone   400 mg Oral Daily   apixaban   5 mg Oral BID   arformoterol   15 mcg Nebulization BID   atorvastatin   10 mg Oral Daily   budesonide  (PULMICORT ) nebulizer solution  0.5 mg Nebulization BID   clonazePAM   0.5 mg Oral BID   colchicine   0.3 mg Oral Q M,W,F   gabapentin   300 mg Oral QHS   insulin  aspart  0-20 Units Subcutaneous TID WC   insulin  aspart  0-5 Units Subcutaneous QHS   insulin  aspart  8 Units Subcutaneous TID WC   insulin   glargine-yfgn  50 Units Subcutaneous BID   lidocaine   1 patch Transdermal Q24H   methylPREDNISolone  (SOLU-MEDROL ) injection  40 mg Intravenous Q12H   metoprolol  succinate  12.5 mg Oral Daily   pantoprazole  (PROTONIX ) IV  40 mg Intravenous Q12H   polyethylene glycol  17 g Oral Daily   potassium chloride  SA  40 mEq Oral Daily   revefenacin   175 mcg Nebulization Daily   sertraline   75 mg Oral q AM   sodium chloride  flush  3 mL Intravenous Q12H   sucralfate   1 g Oral TID WC & HS   tamsulosin   0.8 mg Oral Daily   timolol   1 drop Both Eyes q morning   [START ON 12/30/2023] torsemide   20 mg Oral Daily   Continuous Infusions:     LOS: 10 days    Time spent:    Sigurd Pac, MD [facility]   12/29/2023, 11:55 AM

## 2023-12-29 NOTE — Progress Notes (Signed)
 Mobility Specialist Progress Note:    12/29/23 1100  Mobility  Activity Ambulated independently in hallway  Level of Assistance Standby assist, set-up cues, supervision of patient - no hands on  Assistive Device None  Distance Ambulated (ft) 300 ft  Activity Response Tolerated fair (Lower back pain causing a slight right sided limp)  Mobility Referral Yes  Mobility visit 1 Mobility  Mobility Specialist Start Time (ACUTE ONLY) 1100  Mobility Specialist Stop Time (ACUTE ONLY) 1112  Mobility Specialist Time Calculation (min) (ACUTE ONLY) 12 min   Pt in pain but agreeable to session. Pt able to stand and ambulate w/o any assistance. C/o lower back pain causing a slight limp and holding on to rail periodically. Returned pt to EOB w/ all needs met and RN in the room.   Venetia Keel Mobility Specialist Please Neurosurgeon or Rehab Office at [phone]

## 2023-12-30 DIAGNOSIS — I4891 Unspecified atrial fibrillation: Secondary | ICD-10-CM | POA: Diagnosis not present

## 2023-12-30 DIAGNOSIS — G4733 Obstructive sleep apnea (adult) (pediatric): Secondary | ICD-10-CM | POA: Diagnosis not present

## 2023-12-30 DIAGNOSIS — R079 Chest pain, unspecified: Secondary | ICD-10-CM | POA: Diagnosis not present

## 2023-12-30 DIAGNOSIS — J441 Chronic obstructive pulmonary disease with (acute) exacerbation: Secondary | ICD-10-CM | POA: Diagnosis not present

## 2023-12-30 LAB — CBC
HCT: 35.4 % — ABNORMAL LOW (ref 39.0–52.0)
Hemoglobin: 11.8 g/dL — ABNORMAL LOW (ref 13.0–17.0)
MCH: 31.1 pg (ref 26.0–34.0)
MCHC: 33.3 g/dL (ref 30.0–36.0)
MCV: 93.2 fL (ref 80.0–100.0)
Platelets: 268 K/uL (ref 150–400)
RBC: 3.8 MIL/uL — ABNORMAL LOW (ref 4.22–5.81)
RDW: 15.3 % (ref 11.5–15.5)
WBC: 14.3 K/uL — ABNORMAL HIGH (ref 4.0–10.5)
nRBC: 0 % (ref 0.0–0.2)

## 2023-12-30 LAB — BASIC METABOLIC PANEL WITH GFR
Anion gap: 12 (ref 5–15)
BUN: 55 mg/dL — ABNORMAL HIGH (ref 6–20)
CO2: 24 mmol/L (ref 22–32)
Calcium: 8.5 mg/dL — ABNORMAL LOW (ref 8.9–10.3)
Chloride: 99 mmol/L (ref 98–111)
Creatinine, Ser: 2.63 mg/dL — ABNORMAL HIGH (ref 0.61–1.24)
GFR, Estimated: 27 mL/min — ABNORMAL LOW (ref >60)
Glucose, Bld: 265 mg/dL — ABNORMAL HIGH (ref 70–99)
Potassium: 4.2 mmol/L (ref 3.5–5.1)
Sodium: 135 mmol/L (ref 135–145)

## 2023-12-30 LAB — GLUCOSE, CAPILLARY
Glucose-Capillary: 265 mg/dL — ABNORMAL HIGH (ref 70–99)
Glucose-Capillary: 211 mg/dL — ABNORMAL HIGH (ref 70–99)
Glucose-Capillary: 262 mg/dL — ABNORMAL HIGH (ref 70–99)
Glucose-Capillary: 209 mg/dL — ABNORMAL HIGH (ref 70–99)

## 2023-12-30 LAB — SEDIMENTATION RATE: Sed Rate: 65 mm/h — ABNORMAL HIGH (ref 0–16)

## 2023-12-30 LAB — C-REACTIVE PROTEIN: CRP: 11.2 mg/dL — ABNORMAL HIGH (ref <1.0)

## 2023-12-30 MED ORDER — TORSEMIDE 20 MG PO TABS
40.0000 mg | ORAL_TABLET | Freq: Every day | ORAL | Status: DC
  Administered 2023-12-31 – 2024-01-05 (×6): 40 mg via ORAL
  Filled 2023-12-30 (×6): qty 2

## 2023-12-30 MED ORDER — INSULIN GLARGINE-YFGN 100 UNIT/ML ~~LOC~~ SOLN
60.0000 [IU] | Freq: Two times a day (BID) | SUBCUTANEOUS | Status: DC
  Administered 2023-12-30 – 2024-01-03 (×9): 60 [IU] via SUBCUTANEOUS
  Filled 2023-12-30 (×10): qty 0.6

## 2023-12-30 MED ORDER — FAMOTIDINE 20 MG PO TABS
20.0000 mg | ORAL_TABLET | Freq: Every day | ORAL | Status: DC
  Administered 2023-12-30 – 2024-01-04 (×6): 20 mg via ORAL
  Filled 2023-12-30 (×6): qty 1

## 2023-12-30 NOTE — Plan of Care (Signed)
  Problem: Education: Goal: Ability to describe self-care measures that may prevent or decrease complications (Diabetes Survival Skills Education) will improve 12/30/2023 2223 by Edith Darice BROCKS, RN Outcome: Progressing 12/30/2023 2222 by Edith Darice BROCKS, RN Outcome: Progressing   Problem: Coping: Goal: Ability to adjust to condition or change in health will improve 12/30/2023 2223 by Edith Darice BROCKS, RN Outcome: Progressing 12/30/2023 2222 by Edith Darice BROCKS, RN Outcome: Progressing   Problem: Education: Goal: Knowledge of General Education information will improve Description: Including pain rating scale, medication(s)/side effects and non-pharmacologic comfort measures Outcome: Progressing   Problem: Clinical Measurements: Goal: Respiratory complications will improve 12/30/2023 2223 by Edith Darice BROCKS, RN Outcome: Progressing 12/30/2023 2222 by Edith Darice BROCKS, RN Outcome: Progressing   Problem: Activity: Goal: Risk for activity intolerance will decrease 12/30/2023 2223 by Edith Darice BROCKS, RN Outcome: Progressing 12/30/2023 2222 by Edith Darice BROCKS, RN Outcome: Progressing

## 2023-12-30 NOTE — Plan of Care (Signed)
  Problem: Education: Goal: Ability to describe self-care measures that may prevent or decrease complications (Diabetes Survival Skills Education) will improve Outcome: Progressing   Problem: Coping: Goal: Ability to adjust to condition or change in health will improve Outcome: Progressing   Problem: Education: Goal: Knowledge of General Education information will improve Description: Including pain rating scale, medication(s)/side effects and non-pharmacologic comfort measures Outcome: Progressing   Problem: Clinical Measurements: Goal: Respiratory complications will improve Outcome: Progressing   Problem: Activity: Goal: Risk for activity intolerance will decrease Outcome: Progressing

## 2023-12-30 NOTE — Progress Notes (Signed)
 PROGRESS NOTE    [name]  FMW:[id number] DOB: Mar 22, 1966 DOA: 12/19/2023 PCP: Vicci Barnie NOVAK, MD   58/M w cardiac arrest status post AICD placement, NICM, atrial fibrillation on anticoagulation, T2DM, stage IIIb CKD  who presented to the ED on 12/19/2023 with worsening shortness of breath over the last couple of days. Work up in the ED included nonacute CXR, admitted with CHF exacerbation  - Cards following, improved with diuresis - 7/18, discharge completed,, cards signed off,  however subsequently reported left-sided chest pain, x-ray and troponins unremarkable - 7/19 AM, VQ scan ordered - 7/20, worsening chest pain, left-sided, pleuritic >VQ scan completed, low probability, noncontrast CT also unremarkable - 7/21, CRP elevated, started on IV steroids,?  Pleurisy, seen by pulmonary> suspected to be musculoskeletal -7/22: Pain extending to left shoulder and upper back, limited range of motion left shoulder> CT with severe glenohumeral osteoarthritis  Subjective: - Reports feeling a little better, still has significant left shoulder pain and tenderness, limited range of motion, left lateral chest wall pain is improving  Assessment and Plan:  Acute on chronic systolic CHF -Repeat echo with EF 40-45%, moderate LVH, grade 1 DD, normal RV -GDMT limited by CKD - Improved with diuresis, 14 L negative - Now euvolemic, transitioned to oral torsemide , continue Toprol , now p.o. intake is poor will decrease torsemide  dose -GDMT limited by CKD  Left lateral chest wall pain Now severe left shoulder pain, limited ROM - New onset since 7/18, chest x-ray negative, troponins flat - VQ scan no PE -CT chest noncontrast also unremarkable -2D echo last week with stable cardiomyopathy, no pericardial effusion - Started him on steroids 7/21 for suspicion of pleurisy, CRP moderately elevated, ESR mildly up,  - Yesterday 7/22 developed worsening left shoulder pain and limited ROM,> this is new, CT  with severe OA, which does not correlate with the acuity of his symptoms, will request Ortho eval for arthrocentesis - Gout is a possibility, uric acid elevated, continue Solu-Medrol  day 3 now  Esophageal dysphagia - History of aspiration like episode prior to admission - Continue PPI, refer to GI - continue carafate   Paroxysmal atrial fibrillation - Currently in sinus rhythm, continue amiodarone , Eliquis   History of VT Continue amiodarone    OSA: intolerant even of new CPAP machine at home  - CPAP nightly   AKI on stage IIIb CKD: SCr on admission is 3.2 from baseline 2.7 - Improved and stable now at baseline of 2.5-2.7   IDT2DM with steroid induced hyperglycemia:  - Continue Semglee  - FU with endocrinology, Dr. Trixie.    HLD:  - Continue statin   Depression:  - Continue SSRI   BPH:  - Continue tamsulosin     DVT prophylaxis: Apixaban  Code Status: DNR Family Communication: brother at bedside this weekend Disposition Plan: Home pending improvement in symptoms  Consultants: Pulm   Procedures:   Antimicrobials:    Objective: Vitals:   12/30/23 0712 12/30/23 0713 12/30/23 0745 12/30/23 1134  BP:   (!) 141/78 113/65  Pulse:   64   Resp:   20   Temp:   97.6 F (36.4 C)   TempSrc:   Oral   SpO2: 95% 95% 94%   Weight:      Height:        Intake/Output Summary (Last 24 hours) at 12/30/2023 1144 Last data filed at 12/30/2023 0857 Gross per 24 hour  Intake 472 ml  Output 2550 ml  Net -2078 ml   Filed Weights   12/28/23 9392  12/29/23 0457 12/30/23 0442  Weight: 129.1 kg 130.1 kg 130.5 kg    Examination:  General exam: He is chronically ill, AAO x 3, uncomfortable appearing HEENT: Neck obese unable to assess JVD CVS: S1-S2, regular rhythm Lungs: Poor air movement otherwise clear Abdomen: Soft, nontender, bowel sounds present Extremities: Left shoulder pain and tenderness, limited range of motion, tenderness in left posterior/upper back Skin: No  rashes Psychiatry: Flat affect, uncomfortable    Data Reviewed:   CBC: Recent Labs  Lab 12/27/23 0851 12/28/23 0302 12/29/23 0143 12/30/23 0228  WBC 10.0 11.0* 15.1* 14.3*  HGB 14.3 14.3 12.2* 11.8*  HCT 43.3 43.9 36.6* 35.4*  MCV 93.3 94.4 93.1 93.2  PLT 274 274 270 268   Basic Metabolic Panel: Recent Labs  Lab 12/24/23 0230 12/25/23 0204 12/26/23 0228 12/27/23 0851 12/28/23 0302 12/29/23 0143 12/30/23 0228  NA 137 138 138 136 140 134* 135  K 3.8 3.3* 3.3* 3.7 3.4* 4.0 4.2  CL 99 96* 99 97* 95* 100 99  CO2 24 29 29 25 30 23 24   GLUCOSE 181* 156* 118* 184* 155* 274* 265*  BUN 56* 57* 57* 54* 52* 51* 55*  CREATININE 2.49* 2.57* 2.61* 2.69* 2.68* 2.81* 2.63*  CALCIUM  9.6 9.3 9.1 9.2 9.3 8.7* 8.5*  MG 2.3 2.1  --   --   --   --   --    GFR: Estimated Creatinine Clearance: 42.8 mL/min (A) (by C-G formula based on SCr of 2.63 mg/dL (H)). Liver Function Tests: Recent Labs  Lab 12/27/23 0851  AST 37  ALT 47*  ALKPHOS 81  BILITOT 0.7  PROT 8.1  ALBUMIN  3.9   No results for input(s): LIPASE, AMYLASE in the last 168 hours. No results for input(s): AMMONIA in the last 168 hours. Coagulation Profile: No results for input(s): INR, PROTIME in the last 168 hours. Cardiac Enzymes: No results for input(s): CKTOTAL, CKMB, CKMBINDEX, TROPONINI in the last 168 hours. BNP (last 3 results) No results for input(s): PROBNP in the last 8760 hours. HbA1C: No results for input(s): HGBA1C in the last 72 hours. CBG: Recent Labs  Lab 12/29/23 1051 12/29/23 1613 12/29/23 2108 12/30/23 0617 12/30/23 1105  GLUCAP 320* 297* 270* 265* 211*   Lipid Profile: No results for input(s): CHOL, HDL, LDLCALC, TRIG, CHOLHDL, LDLDIRECT in the last 72 hours. Thyroid  Function Tests: No results for input(s): TSH, T4TOTAL, FREET4, T3FREE, THYROIDAB in the last 72 hours. Anemia Panel: No results for input(s): VITAMINB12, FOLATE, FERRITIN,  TIBC, IRON, RETICCTPCT in the last 72 hours. Urine analysis:    Component Value Date/Time   COLORURINE STRAW (A) 10/08/2023 0952   APPEARANCEUR CLEAR 10/08/2023 0952   APPEARANCEUR Clear 07/17/2020 1655   LABSPEC 1.009 10/08/2023 0952   PHURINE 5.0 10/08/2023 0952   GLUCOSEU 50 (A) 10/08/2023 0952   HGBUR NEGATIVE 10/08/2023 0952   BILIRUBINUR NEGATIVE 10/08/2023 0952   BILIRUBINUR negative 01/30/2022 1705   BILIRUBINUR Negative 07/17/2020 1655   KETONESUR NEGATIVE 10/08/2023 0952   PROTEINUR NEGATIVE 10/08/2023 0952   UROBILINOGEN 0.2 01/30/2022 1705   UROBILINOGEN 1.0 06/20/2013 1032   NITRITE NEGATIVE 10/08/2023 0952   LEUKOCYTESUR NEGATIVE 10/08/2023 0952   Sepsis Labs: @LABRCNTIP (procalcitonin:4,lacticidven:4)  )No results found for this or any previous visit (from the past 240 hours).   Radiology Studies: VAS US  UPPER EXTREMITY VENOUS DUPLEX Result Date: 12/29/2023 UPPER VENOUS STUDY  Patient Name:  TRYSTIAN CRISANTO  Date of Exam:   12/29/2023 Medical Rec #: [id number]  Accession #:    [id number] Date of Birth: 01-26-1966       Patient Gender: M Patient Age:   89 years Exam Location:  [facility] Procedure:      VAS US  UPPER EXTREMITY VENOUS DUPLEX Referring Phys: SIGURD PAC --------------------------------------------------------------------------------  Indications: Edema Comparison Study: 11/2023 left cephalic vein SVT Performing Technologist: Elmarie Lindau, RVT  Examination Guidelines: A complete evaluation includes B-mode imaging, spectral Doppler, color Doppler, and power Doppler as needed of all accessible portions of each vessel. Bilateral testing is considered an integral part of a complete examination. Limited examinations for reoccurring indications may be performed as noted.  Right Findings: +----------+------------+---------+-----------+----------+---------------------+ RIGHT     CompressiblePhasicitySpontaneousProperties       Summary         +----------+------------+---------+-----------+----------+---------------------+ Subclavian                                           normal coaptation,                                                          color flow, and                                                              waveform        +----------+------------+---------+-----------+----------+---------------------+  Left Findings: +----------+------------+---------+-----------+----------+---------------+ LEFT      CompressiblePhasicitySpontaneousProperties    Summary     +----------+------------+---------+-----------+----------+---------------+ IJV           Full                                  thickened walls +----------+------------+---------+-----------+----------+---------------+ Subclavian    Full                                                  +----------+------------+---------+-----------+----------+---------------+ Axillary      Full                                                  +----------+------------+---------+-----------+----------+---------------+ Brachial      Full                                                  +----------+------------+---------+-----------+----------+---------------+ Radial        Full                                                  +----------+------------+---------+-----------+----------+---------------+  Ulnar         Full                                                  +----------+------------+---------+-----------+----------+---------------+ Cephalic      Full                                  thickened walls +----------+------------+---------+-----------+----------+---------------+ Basilic       Full                                                  +----------+------------+---------+-----------+----------+---------------+  Summary:  Right: No evidence of thrombosis in the subclavian.  Left: No evidence of deep vein  thrombosis in the upper extremity. There has been some resolution of the left cephalic vein SVT when compared to the prior exam.  *See table(s) above for measurements and observations.  Diagnosing physician: Norman Serve Electronically signed by Norman Serve on 12/29/2023 at 3:27:25 PM.    Final    CT SHOULDER LEFT WO CONTRAST Result Date: 12/29/2023 CLINICAL DATA:  Chronic shoulder pain. EXAM: CT OF THE UPPER LEFT EXTREMITY WITHOUT CONTRAST TECHNIQUE: Multidetector CT imaging of the upper left extremity was performed according to the standard protocol. RADIATION DOSE REDUCTION: This exam was performed according to the departmental dose-optimization program which includes automated exposure control, adjustment of the mA and/or kV according to patient size and/or use of iterative reconstruction technique. COMPARISON:  Left humerus radiographs dated 07/28/2013. FINDINGS: Bones/Joint/Cartilage No acute fracture or dislocation. Partially visualized lateral plate and screw fixation of a remote mid humeral diaphyseal fracture. Severe glenohumeral osteoarthritis manifested by marked joint space narrowing, bulky marginal humeral osteophytosis, and subchondral sclerosis. Mesoacromial os acromiale with moderate osteoarthritis of the acromioclavicular joint manifested by joint space narrowing and osteophytosis. Ligaments Ligaments are suboptimally evaluated by CT. Muscles and Tendons Muscles are normal. No muscle atrophy. No intramuscular fluid collection or hematoma. Soft tissue No fluid collection or hematoma. No soft tissue mass. Partially visualized left subclavian dual lead pacemaker. IMPRESSION: 1. Severe glenohumeral osteoarthritis. 2. Moderate acromioclavicular osteoarthritis with os acromiale. 3. Partially visualized lateral plate and screw fixation of a remote mid humeral diaphyseal fracture. Electronically Signed   By: Harrietta Sherry M.D.   On: 12/29/2023 14:54      Scheduled Meds:  amiodarone   400 mg  Oral Daily   apixaban   5 mg Oral BID   arformoterol   15 mcg Nebulization BID   atorvastatin   10 mg Oral Daily   budesonide  (PULMICORT ) nebulizer solution  0.5 mg Nebulization BID   clonazePAM   0.5 mg Oral BID   colchicine   0.3 mg Oral Q M,W,F   famotidine   20 mg Oral QHS   gabapentin   300 mg Oral QHS   insulin  aspart  0-20 Units Subcutaneous TID WC   insulin  aspart  0-5 Units Subcutaneous QHS   insulin  aspart  8 Units Subcutaneous TID WC   insulin  glargine-yfgn  60 Units Subcutaneous BID   methylPREDNISolone  (SOLU-MEDROL ) injection  40 mg Intravenous Q12H   metoprolol  succinate  12.5 mg Oral Daily   pantoprazole  (PROTONIX ) IV  40 mg Intravenous Q12H   polyethylene  glycol  17 g Oral Daily   potassium chloride  SA  40 mEq Oral Daily   revefenacin   175 mcg Nebulization Daily   sertraline   75 mg Oral q AM   sodium chloride  flush  3 mL Intravenous Q12H   sucralfate   1 g Oral TID WC & HS   tamsulosin   0.8 mg Oral Daily   timolol   1 drop Both Eyes q morning   torsemide   20 mg Oral Daily   Continuous Infusions:     LOS: 11 days    Time spent:    Sigurd Pac, MD [facility]   12/30/2023, 11:44 AM

## 2023-12-30 NOTE — Plan of Care (Signed)
  Problem: Education: Goal: Ability to describe self-care measures that may prevent or decrease complications (Diabetes Survival Skills Education) will improve Outcome: Progressing Goal: Individualized Educational Video(s) Outcome: Progressing   Problem: Coping: Goal: Ability to adjust to condition or change in health will improve Outcome: Progressing   Problem: Fluid Volume: Goal: Ability to maintain a balanced intake and output will improve Outcome: Progressing   Problem: Health Behavior/Discharge Planning: Goal: Ability to identify and utilize available resources and services will improve Outcome: Progressing Goal: Ability to manage health-related needs will improve Outcome: Progressing   Problem: Metabolic: Goal: Ability to maintain appropriate glucose levels will improve Outcome: Progressing   Problem: Nutritional: Goal: Maintenance of adequate nutrition will improve Outcome: Progressing Goal: Progress toward achieving an optimal weight will improve Outcome: Progressing   Problem: Skin Integrity: Goal: Risk for impaired skin integrity will decrease Outcome: Progressing   Problem: Tissue Perfusion: Goal: Adequacy of tissue perfusion will improve Outcome: Progressing   Problem: Education: Goal: Knowledge of General Education information will improve Description: Including pain rating scale, medication(s)/side effects and non-pharmacologic comfort measures Outcome: Progressing   Problem: Health Behavior/Discharge Planning: Goal: Ability to manage health-related needs will improve Outcome: Progressing   Problem: Clinical Measurements: Goal: Ability to maintain clinical measurements within normal limits will improve Outcome: Progressing Goal: Will remain free from infection Outcome: Progressing Goal: Diagnostic test results will improve Outcome: Progressing Goal: Respiratory complications will improve Outcome: Progressing Goal: Cardiovascular complication will  be avoided Outcome: Progressing   Problem: Coping: Goal: Level of anxiety will decrease Outcome: Progressing   Problem: Elimination: Goal: Will not experience complications related to bowel motility Outcome: Progressing Goal: Will not experience complications related to urinary retention Outcome: Progressing

## 2023-12-30 NOTE — Consult Note (Signed)
 Reason for Consult:Left shoulder pain Referring Physician: Sigurd Pac Time called: 1139 Time at bedside: 1146   [name] is an 58 y.o. male.  HPI: Goble was admitted 10d ago following an aspiration event. He came in c/o left chest pain. About 4d ago he began to have radiation to the left shoulder as well. Workup thus far has been negative though limited by his renal and implant status. A dx of gout was entertained (no hx/o) and orthopedic surgery was asked to consult. He denies prior hx/o similar nor any antecedent event. He is RHD.  Past Medical History:  Diagnosis Date   AICD (automatic cardioverter/defibrillator) present    Chronic bronchitis (HCC)    get it most q yr (12/23/2013)   Chronic systolic (congestive) heart failure (HCC)    Fracture of left humerus    a. 07/2013.   GERD (gastroesophageal reflux disease)    not at present time   Heart murmur    born w/it    History of renal calculi    Hypertension    Kidney stones    NICM (nonischemic cardiomyopathy) (HCC)    a. 07/2013 Echo: EF 20-25%, diff HK, Gr2 DD, mild MR, mod dil LA/RA. 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    Shockable heart rhythm detected by automated external defibrillator    Syncope    Type II diabetes mellitus (HCC) 2006    Past Surgical History:  Procedure Laterality Date   CARDIAC CATHETERIZATION  08/10/13   CHOLECYSTECTOMY  ~ 2010   IMPLANTABLE CARDIOVERTER DEFIBRILLATOR IMPLANT  12/23/2013   STJ Fortify ICD implanted by Dr Kelsie for cardiomyopathy and syncope   IMPLANTABLE CARDIOVERTER DEFIBRILLATOR IMPLANT N/A 12/23/2013   Procedure: IMPLANTABLE CARDIOVERTER DEFIBRILLATOR IMPLANT;  Surgeon: Lynwood JONETTA Kelsie, MD;  Location: [facility] CATH LAB;  Service: Cardiovascular;  Laterality: N/A;   INGUINAL HERNIA REPAIR Left 03/01/2018   Procedure: OPEN REPAIR OF LEFT INGUINAL HERNIA WITH MESH;  Surgeon: Tanda Locus, MD;   Location: [facility] ORS;  Service: General;  Laterality: Left;   LEAD REVISION/REPAIR N/A 05/15/2022   Procedure: LEAD REVISION/REPAIR;  Surgeon: [name] Ole DASEN, MD;  Location: [facility] INVASIVE CV LAB;  Service: Cardiovascular;  Laterality: N/A;   LEFT HEART CATHETERIZATION WITH CORONARY ANGIOGRAM N/A 08/10/2013   Procedure: LEFT HEART CATHETERIZATION WITH CORONARY ANGIOGRAM;  Surgeon: Candyce GORMAN Reek, MD;  Location: [facility] CATH LAB;  Service: Cardiovascular;  Laterality: N/A;   ORIF HUMERUS FRACTURE Left 08/12/2013   Procedure: OPEN REDUCTION INTERNAL FIXATION (ORIF) HUMERAL SHAFT FRACTURE;  Surgeon: Jerona LULLA Sage, MD;  Location: [facility] OR;  Service: Orthopedics;  Laterality: Left;  Open Reduction Internal Fixation Left Humerus   RIGHT/LEFT HEART CATH AND CORONARY ANGIOGRAPHY N/A 05/12/2022   Procedure: RIGHT/LEFT HEART CATH AND CORONARY ANGIOGRAPHY;  Surgeon: Gardenia Led, DO;  Location: [facility] INVASIVE CV LAB;  Service: Cardiovascular;  Laterality: N/A;   URETEROSCOPY     laser for kidney stones    Family History  Problem Relation Age of Onset   Diabetes Father    Arthritis Father    Hypertension Father    Diabetes Mother    Arthritis Mother    Hypertension Mother    Hypertension Brother     Social History:  reports that he quit smoking about 9 years ago. His smoking use included cigarettes. He started smoking about 37 years ago. He has a 28 pack-year smoking history. He has never used  smokeless tobacco. He reports that he does not currently use alcohol. He reports current drug use. Drug: Marijuana.  Allergies:  Allergies  Allergen Reactions   Bee Venom Anaphylaxis   Trulicity  [Dulaglutide ] Anaphylaxis and Diarrhea    Extra dose of Trulicity  caused cardiac arrest. Patient experience onset of diarrhea with using this medication - cleared after med stopped.    Medications: I have reviewed the patient's current medications.  Results for orders placed or performed during the hospital encounter  of 12/19/23 (from the past 48 hours)  Glucose, capillary     Status: Abnormal   Collection Time: 12/28/23  4:14 PM  Result Value Ref Range   Glucose-Capillary 249 (H) 70 - 99 mg/dL    Comment: Glucose reference range applies only to samples taken after fasting for at least 8 hours.  Glucose, capillary     Status: Abnormal   Collection Time: 12/28/23  8:58 PM  Result Value Ref Range   Glucose-Capillary 244 (H) 70 - 99 mg/dL    Comment: Glucose reference range applies only to samples taken after fasting for at least 8 hours.  Basic metabolic panel with GFR     Status: Abnormal   Collection Time: 12/29/23  1:43 AM  Result Value Ref Range   Sodium 134 (L) 135 - 145 mmol/L   Potassium 4.0 3.5 - 5.1 mmol/L   Chloride 100 98 - 111 mmol/L   CO2 23 22 - 32 mmol/L   Glucose, Bld 274 (H) 70 - 99 mg/dL    Comment: Glucose reference range applies only to samples taken after fasting for at least 8 hours.   BUN 51 (H) 6 - 20 mg/dL   Creatinine, Ser 7.18 (H) 0.61 - 1.24 mg/dL   Calcium  8.7 (L) 8.9 - 10.3 mg/dL   GFR, Estimated 25 (L) >60 mL/min    Comment: (NOTE) Calculated using the CKD-EPI Creatinine Equation (2021)    Anion gap 11 5 - 15    Comment: Performed at [facility], [address], [place], [place] [address]  CBC     Status: Abnormal   Collection Time: 12/29/23  1:43 AM  Result Value Ref Range   WBC 15.1 (H) 4.0 - 10.5 K/uL   RBC 3.93 (L) 4.22 - 5.81 MIL/uL   Hemoglobin 12.2 (L) 13.0 - 17.0 g/dL   HCT 63.3 (L) 60.9 - 47.9 %   MCV 93.1 80.0 - 100.0 fL   MCH 31.0 26.0 - 34.0 pg   MCHC 33.3 30.0 - 36.0 g/dL   RDW 84.9 88.4 - 84.4 %   Platelets 270 150 - 400 K/uL   nRBC 0.0 0.0 - 0.2 %    Comment: Performed at [facility], [address], [place], [place] [address]  Uric acid     Status: Abnormal   Collection Time: 12/29/23  1:43 AM  Result Value Ref Range   Uric Acid, Serum 14.2 (H) 3.7 - 8.6 mg/dL    Comment: HEMOLYSIS AT THIS LEVEL MAY AFFECT RESULT Performed  at [facility], [address], [place], [place] [address]   Glucose, capillary     Status: Abnormal   Collection Time: 12/29/23  6:46 AM  Result Value Ref Range   Glucose-Capillary 300 (H) 70 - 99 mg/dL    Comment: Glucose reference range applies only to samples taken after fasting for at least 8 hours.  Glucose, capillary     Status: Abnormal   Collection Time: 12/29/23 10:51 AM  Result Value Ref Range   Glucose-Capillary 320 (H) 70 - 99 mg/dL    Comment: Glucose reference range applies only to samples taken after fasting for at least 8 hours.  Glucose, capillary     Status: Abnormal   Collection Time: 12/29/23  4:13 PM  Result Value Ref Range   Glucose-Capillary 297 (H) 70 - 99 mg/dL    Comment: Glucose reference range applies only to samples taken after fasting for at least 8 hours.  Glucose, capillary     Status: Abnormal   Collection Time: 12/29/23  9:08 PM  Result Value Ref Range   Glucose-Capillary 270 (H) 70 - 99 mg/dL    Comment: Glucose reference range applies only to samples taken after fasting for at least 8 hours.   Comment 1 Notify RN    Comment 2 Document in Chart   Basic metabolic panel with GFR     Status: Abnormal   Collection Time: 12/30/23  2:28 AM  Result Value Ref Range   Sodium 135 135 - 145 mmol/L   Potassium 4.2 3.5 - 5.1 mmol/L   Chloride 99 98 - 111 mmol/L   CO2 24 22 - 32 mmol/L   Glucose, Bld 265 (H) 70 - 99 mg/dL    Comment: Glucose reference range applies only to samples taken after fasting for at least 8 hours.   BUN 55 (H) 6 - 20 mg/dL   Creatinine, Ser 7.36 (H) 0.61 - 1.24 mg/dL   Calcium  8.5 (L) 8.9 - 10.3 mg/dL   GFR, Estimated 27 (L) >60 mL/min    Comment: (NOTE) Calculated using the CKD-EPI Creatinine Equation (2021)    Anion gap 12 5 - 15    Comment: Performed at [facility], [address], [place], [place] [address]  CBC     Status: Abnormal   Collection Time: 12/30/23  2:28 AM  Result Value Ref Range   WBC 14.3 (H)  4.0 - 10.5 K/uL   RBC 3.80 (L) 4.22 - 5.81 MIL/uL   Hemoglobin 11.8 (L) 13.0 - 17.0 g/dL   HCT 64.5 (L) 60.9 - 47.9 %   MCV 93.2 80.0 - 100.0 fL   MCH 31.1 26.0 - 34.0 pg   MCHC 33.3 30.0 - 36.0 g/dL   RDW 84.6 88.4 - 84.4 %   Platelets 268 150 - 400 K/uL   nRBC 0.0 0.0 - 0.2 %    Comment: Performed at [facility], [address], [place], [place] [address]  C-reactive protein     Status: Abnormal   Collection Time: 12/30/23  2:28 AM  Result Value Ref Range   CRP 11.2 (H) <1.0 mg/dL    Comment: Performed at [facility], [address], [place], [place] [address]  Sedimentation rate     Status: Abnormal   Collection Time: 12/30/23  2:28 AM  Result Value Ref Range   Sed Rate 65 (H) 0 - 16 mm/hr    Comment: Performed at [facility], [address], [place], [place] [address]  Glucose, capillary     Status: Abnormal   Collection Time: 12/30/23  6:17 AM  Result Value Ref Range   Glucose-Capillary 265 (H) 70 - 99 mg/dL    Comment: Glucose reference range applies only to samples taken after fasting for at least 8 hours.   Comment 1 Notify RN    Comment 2 Document in Chart   Glucose, capillary     Status: Abnormal  Collection Time: 12/30/23 11:05 AM  Result Value Ref Range   Glucose-Capillary 211 (H) 70 - 99 mg/dL    Comment: Glucose reference range applies only to samples taken after fasting for at least 8 hours.   *Note: Due to a large number of results and/or encounters for the requested time period, some results have not been displayed. A complete set of results can be found in Results Review.    VAS US  UPPER EXTREMITY VENOUS DUPLEX Result Date: 12/29/2023 UPPER VENOUS STUDY  Patient Name:  [name]  Date of Exam:   12/29/2023 Medical Rec #: [id number]       Accession #:    [id number] Date of Birth: 05-08-1966       Patient Gender: M Patient Age:   85 years Exam Location:  [facility] Procedure:      VAS US  UPPER EXTREMITY VENOUS DUPLEX Referring  Phys: SIGURD PAC --------------------------------------------------------------------------------  Indications: Edema Comparison Study: 11/2023 left cephalic vein SVT Performing Technologist: Elmarie Lindau, RVT  Examination Guidelines: A complete evaluation includes B-mode imaging, spectral Doppler, color Doppler, and power Doppler as needed of all accessible portions of each vessel. Bilateral testing is considered an integral part of a complete examination. Limited examinations for reoccurring indications may be performed as noted.  Right Findings: +----------+------------+---------+-----------+----------+---------------------+ RIGHT     CompressiblePhasicitySpontaneousProperties       Summary        +----------+------------+---------+-----------+----------+---------------------+ Subclavian                                           normal coaptation,                                                          color flow, and                                                              waveform        +----------+------------+---------+-----------+----------+---------------------+  Left Findings: +----------+------------+---------+-----------+----------+---------------+ LEFT      CompressiblePhasicitySpontaneousProperties    Summary     +----------+------------+---------+-----------+----------+---------------+ IJV           Full                                  thickened walls +----------+------------+---------+-----------+----------+---------------+ Subclavian    Full                                                  +----------+------------+---------+-----------+----------+---------------+ Axillary      Full                                                  +----------+------------+---------+-----------+----------+---------------+  Brachial      Full                                                   +----------+------------+---------+-----------+----------+---------------+ Radial        Full                                                  +----------+------------+---------+-----------+----------+---------------+ Ulnar         Full                                                  +----------+------------+---------+-----------+----------+---------------+ Cephalic      Full                                  thickened walls +----------+------------+---------+-----------+----------+---------------+ Basilic       Full                                                  +----------+------------+---------+-----------+----------+---------------+  Summary:  Right: No evidence of thrombosis in the subclavian.  Left: No evidence of deep vein thrombosis in the upper extremity. There has been some resolution of the left cephalic vein SVT when compared to the prior exam.  *See table(s) above for measurements and observations.  Diagnosing physician: Norman Serve Electronically signed by Norman Serve on 12/29/2023 at 3:27:25 PM.    Final    CT SHOULDER LEFT WO CONTRAST Result Date: 12/29/2023 CLINICAL DATA:  Chronic shoulder pain. EXAM: CT OF THE UPPER LEFT EXTREMITY WITHOUT CONTRAST TECHNIQUE: Multidetector CT imaging of the upper left extremity was performed according to the standard protocol. RADIATION DOSE REDUCTION: This exam was performed according to the departmental dose-optimization program which includes automated exposure control, adjustment of the mA and/or kV according to patient size and/or use of iterative reconstruction technique. COMPARISON:  Left humerus radiographs dated 07/28/2013. FINDINGS: Bones/Joint/Cartilage No acute fracture or dislocation. Partially visualized lateral plate and screw fixation of a remote mid humeral diaphyseal fracture. Severe glenohumeral osteoarthritis manifested by marked joint space narrowing, bulky marginal humeral osteophytosis, and subchondral  sclerosis. Mesoacromial os acromiale with moderate osteoarthritis of the acromioclavicular joint manifested by joint space narrowing and osteophytosis. Ligaments Ligaments are suboptimally evaluated by CT. Muscles and Tendons Muscles are normal. No muscle atrophy. No intramuscular fluid collection or hematoma. Soft tissue No fluid collection or hematoma. No soft tissue mass. Partially visualized left subclavian dual lead pacemaker. IMPRESSION: 1. Severe glenohumeral osteoarthritis. 2. Moderate acromioclavicular osteoarthritis with os acromiale. 3. Partially visualized lateral plate and screw fixation of a remote mid humeral diaphyseal fracture. Electronically Signed   By: Harrietta Sherry M.D.   On: 12/29/2023 14:54    Review of Systems  HENT:  Negative for ear discharge, ear pain, hearing loss and tinnitus.   Eyes:  Negative for photophobia and pain.  Respiratory:  Negative for cough and shortness  of breath.   Cardiovascular:  Positive for chest pain.  Gastrointestinal:  Negative for abdominal pain, nausea and vomiting.  Genitourinary:  Negative for dysuria, flank pain, frequency and urgency.  Musculoskeletal:  Positive for arthralgias (Left shoulder). Negative for back pain, myalgias and neck pain.  Neurological:  Negative for dizziness and headaches.  Hematological:  Does not bruise/bleed easily.  Psychiatric/Behavioral:  The patient is not nervous/anxious.    Blood pressure 113/65, pulse 64, temperature 97.6 F (36.4 C), temperature source Oral, resp. rate 20, height 6' 0.99 (1.854 m), weight 130.5 kg, SpO2 94%. Physical Exam Constitutional:      General: He is not in acute distress.    Appearance: He is well-developed. He is not diaphoretic.  HENT:     Head: Normocephalic and atraumatic.  Eyes:     General: No scleral icterus.       Right eye: No discharge.        Left eye: No discharge.     Conjunctiva/sclera: Conjunctivae normal.  Cardiovascular:     Rate and Rhythm: Normal rate  and regular rhythm.  Pulmonary:     Effort: Pulmonary effort is normal. No respiratory distress.  Musculoskeletal:     Cervical back: Normal range of motion.     Comments: Left shoulder, elbow, wrist, digits- no skin wounds, mild-mod TTP post shoulder along scap spine, painless AROM shoulder abduction and flexion to 45 degrees, no instability, no blocks to motion  Sens  Ax/R intact, M/U paresthetic  Mot   Ax/ R/ PIN/ M/ AIN/ U intact  Rad 2+  Skin:    General: Skin is warm and dry.  Neurological:     Mental Status: He is alert.  Psychiatric:        Mood and Affect: Mood normal.        Behavior: Behavior normal.     Assessment/Plan: Left shoulder pain -- Exam and history more c/w muscular etiology, suspect generalized inflammation. Agree with local treatment and systemic steroids. Do not suspect significant effusion so blind tap likely to be unsuccessful (and pt does not want given odds) but if deemed important suggest radiology tap under image guidance. Would also consider cervical etiology given hand paresthesias, will leave that to primary service. He may f/u with Dr. Cristy upon discharge if his shoulder pain fails to resolve.    Ozell DOROTHA Ned, PA-C Orthopedic Surgery [phone] 12/30/2023, 12:28 PM

## 2023-12-30 NOTE — Plan of Care (Signed)
   Problem: Metabolic: Goal: Ability to maintain appropriate glucose levels will improve Outcome: Progressing

## 2023-12-30 NOTE — Progress Notes (Signed)
 Mobility Specialist Progress Note:    12/30/23 1022  Mobility  Activity Ambulated independently in hallway  Level of Assistance Standby assist, set-up cues, supervision of patient - no hands on  Assistive Device None  Distance Ambulated (ft) 300 ft  Activity Response Tolerated fair (Back pain)  Mobility Referral Yes  Mobility visit 1 Mobility  Mobility Specialist Start Time (ACUTE ONLY) 1022  Mobility Specialist Stop Time (ACUTE ONLY) 1034  Mobility Specialist Time Calculation (min) (ACUTE ONLY) 12 min   Pt in pain but agreeable to session. Pt still having back pain but happy that his team has a plan for it. C/o back pain is causing him pain down his right leg and causing a limp. Returned pt to bed w/ all needs met.   Venetia Keel Mobility Specialist Please Neurosurgeon or Rehab Office at [phone]

## 2023-12-30 NOTE — Progress Notes (Signed)
 NAME:  [name], MRN:  [id number], DOB:  05-25-1966, LOS: 11 ADMISSION DATE:  12/19/2023, CONSULTATION DATE:  12/28/2023 REFERRING MD:  SHAUNNA Pac MD, CHIEF COMPLAINT:  Chest pain   History of Present Illness:   The patient, with COPD,asthma, OSA, CAD, cardiomyopathy and atrial fibrillation, presents with shortness of breath and heart failure. She has been diuresed adequately and cardiology signed off. PCCM consulted for new onset chest pain for 3 days  Chest pain - Severe, left-sided chest pain for three days - Pain described as 'unbearable' and prevents daily activities, including getting up to go to the bathroom - Radiates to the back - Exacerbated by movement and coughing - No recent falls or trauma - Denies association with heartburn  Dyspnea and wheezing - Shortness of breath ongoing, with slight improvement after diuresis for heart failure and volume overload - Wheezing present for the past few weeks - Unable to tolerate CPAP for severe sleep apnea at home or in clinic - Currently using Symbicort  inhaler as outpatient; started on Breo during this visit  Cough and sputum production - Coughing up thick brown matter following an episode of choking on water, which preceded current symptoms  Gastroesophageal reflux disease - Currently on famotidine  for acid reflux, but has not received it during this hospital stay - Previously on Protonix , but it is not currently ordered  Pertinent  Medical History    has a past medical history of AICD (automatic cardioverter/defibrillator) present, Chronic bronchitis (HCC), Chronic systolic (congestive) heart failure (HCC), Fracture of left humerus, GERD (gastroesophageal reflux disease), Heart murmur, History of renal calculi, Hypertension, Kidney stones, NICM (nonischemic cardiomyopathy) (HCC), Obesity, 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   Significant Hospital Events: Including procedures, antibiotic start and stop dates in addition to other pertinent events   7/12 Admit 7/21 PCCM consult  Interim History / Subjective:   Pain is slightly better. States that it is 7/10 now from 10/10 yesterday  Objective    Blood pressure 129/74, pulse 65, temperature 97.8 F (36.6 C), temperature source Oral, resp. rate 18, height 6' 0.99 (1.854 m), weight 130.5 kg, SpO2 95%.        Intake/Output Summary (Last 24 hours) at 12/30/2023 0820 Last data filed at 12/30/2023 0700 Gross per 24 hour  Intake 590 ml  Output 2550 ml  Net -1960 ml   Filed Weights   12/28/23 0607 12/29/23 0457 12/30/23 0442  Weight: 129.1 kg 130.1 kg 130.5 kg    Examination: Gen:      No acute distress HEENT:  EOMI, sclera anicteric Neck:     No masses; no thyromegaly Lungs:  Wheezing improved CV:         Regular rate and rhythm; no murmurs Abd:      + bowel sounds; soft, non-tender; no palpable masses, no distension Ext:    No edema; adequate peripheral perfusion Neuro: alert and oriented x 3 Psych: normal mood and affect   Labs/Imaging reviewed VQ scan in 7/20-negative for PE CT scan on 7/20-with no significant intrathoracic abnormalities  Resolved problem list   Assessment and Plan  Chest Pain Severe chest pain for the past three days, described as unbearable and radiating to the back. Pain is reproducible on deep palpation. Negative troponins and EKGs ruled out cardiac origin. Negative for PE on VQ scan and CT chest is unremarkable. Differential includes  musculoskeletal pain. May have component of GERD or COPD exacerbation as he has mild wheeze - Continue pain medication, lidocaine  patch.  Add scheduled ibuprofen - Ensure administration of Protonix  and sucralfate  for acid reflux.  Added Pepcid   Chronic Obstructive Pulmonary Disease (COPD) COPD with exacerbation  characterized by wheezing and shortness of breath. Currently on Breo in the hospital, similar to outpatient Symbicort . - Administer nebulizers including Brovana , Pulmicort , and Yupelri . - IV Solu-Medrol .   Left arm DVT Left arm DVT in Nov 2004 and is on eliquis . Repeat US  in July 2025 showed DVT is gone but has a superficial thrombus.  Repeat US  shows no recurrence of DVT and the superficial thrombus is smaller  Severe Sleep Apnea Recently diagnosed severe sleep apnea with difficulty adjusting to CPAP machine, not currently using it.  Heart Failure with Volume Overload Atrial Fibrillation Recent diuresis performed with slight improvement in breathing Atrial fibrillation managed with Eliquis . No missed doses reported, reducing the risk of thromboembolic events.  Acid Reflux Acid reflux managed with Protonix  and sucralfate . Reports not receiving famotidine  in the hospital. - Ensure administration of Protonix  and sucralfate . - Added pepcid  at night  Best Practice (right click and Reselect all SmartList Selections daily)   Per primary team  Signature:   Gyasi Hazzard MD [facility] See Amion for pager  If no response to pager , please call [phone] until 7pm After 7:00 pm call Elink  [phone] 12/30/2023, 8:20 AM

## 2023-12-31 DIAGNOSIS — J449 Chronic obstructive pulmonary disease, unspecified: Secondary | ICD-10-CM

## 2023-12-31 DIAGNOSIS — R079 Chest pain, unspecified: Secondary | ICD-10-CM | POA: Diagnosis not present

## 2023-12-31 DIAGNOSIS — J441 Chronic obstructive pulmonary disease with (acute) exacerbation: Secondary | ICD-10-CM | POA: Diagnosis not present

## 2023-12-31 LAB — GLUCOSE, CAPILLARY
Glucose-Capillary: 171 mg/dL — ABNORMAL HIGH (ref 70–99)
Glucose-Capillary: 255 mg/dL — ABNORMAL HIGH (ref 70–99)
Glucose-Capillary: 230 mg/dL — ABNORMAL HIGH (ref 70–99)
Glucose-Capillary: 266 mg/dL — ABNORMAL HIGH (ref 70–99)

## 2023-12-31 LAB — BASIC METABOLIC PANEL WITH GFR
Anion gap: 9 (ref 5–15)
BUN: 60 mg/dL — ABNORMAL HIGH (ref 6–20)
CO2: 26 mmol/L (ref 22–32)
Calcium: 8.8 mg/dL — ABNORMAL LOW (ref 8.9–10.3)
Chloride: 103 mmol/L (ref 98–111)
Creatinine, Ser: 2.46 mg/dL — ABNORMAL HIGH (ref 0.61–1.24)
GFR, Estimated: 29 mL/min — ABNORMAL LOW (ref >60)
Glucose, Bld: 226 mg/dL — ABNORMAL HIGH (ref 70–99)
Potassium: 4.4 mmol/L (ref 3.5–5.1)
Sodium: 138 mmol/L (ref 135–145)

## 2023-12-31 LAB — CBC
HCT: 37.3 % — ABNORMAL LOW (ref 39.0–52.0)
Hemoglobin: 12.2 g/dL — ABNORMAL LOW (ref 13.0–17.0)
MCH: 30.4 pg (ref 26.0–34.0)
MCHC: 32.7 g/dL (ref 30.0–36.0)
MCV: 93 fL (ref 80.0–100.0)
Platelets: 290 K/uL (ref 150–400)
RBC: 4.01 MIL/uL — ABNORMAL LOW (ref 4.22–5.81)
RDW: 15.5 % (ref 11.5–15.5)
WBC: 12.4 K/uL — ABNORMAL HIGH (ref 4.0–10.5)
nRBC: 0 % (ref 0.0–0.2)

## 2023-12-31 MED ORDER — METHYLPREDNISOLONE SODIUM SUCC 40 MG IJ SOLR
40.0000 mg | Freq: Every day | INTRAMUSCULAR | Status: DC
  Administered 2024-01-01: 40 mg via INTRAVENOUS
  Filled 2023-12-31: qty 1

## 2023-12-31 NOTE — Progress Notes (Signed)
 [facility]  PROGRESS NOTE  DAYQUAN BUYS FMW:[id number] DOB: 16-Dec-1965 DOA: 12/19/2023 PCP: Vicci Barnie NOVAK, MD   Brief HPI:    58/M w cardiac arrest status post AICD placement, NICM, atrial fibrillation on anticoagulation, T2DM, stage IIIb CKD  who presented to the ED on 12/19/2023 with worsening shortness of breath over the last couple of days. Work up in the ED included nonacute CXR, admitted with CHF exacerbation  - Cards following, improved with diuresis - 7/18, discharge completed,, cards signed off,  however subsequently reported left-sided chest pain, x-ray and troponins unremarkable - 7/19 AM, VQ scan ordered - 7/20, worsening chest pain, left-sided, pleuritic >VQ scan completed, low probability, noncontrast CT also unremarkable - 7/21, CRP elevated, started on IV steroids,?  Pleurisy, seen by pulmonary> suspected to be musculoskeletal -7/22: Pain extending to left shoulder and upper back, limited range of motion left shoulder> CT with severe glenohumeral osteoarthritis   Assessment/Plan:   Acute on chronic systolic CHF -Repeat echo with EF 40-45%, moderate LVH, grade 1 DD, normal RV -GDMT limited by CKD - Improved with diuresis, 14 L negative - Now euvolemic, transitioned to oral torsemide , continue Toprol , now p.o. intake is poor will decrease torsemide  dose -GDMT limited by CKD   Left lateral chest wall pain Now severe left shoulder pain, limited ROM - New onset since 7/18, chest x-ray negative, troponins flat - VQ scan no PE -CT chest noncontrast also unremarkable -2D echo last week with stable cardiomyopathy, no pericardial effusion - Started him on steroids 7/21 for suspicion of pleurisy, CRP moderately elevated, ESR mildly up - Pulmonology consulted, no new recommendations -Recommend to treat musculoskeletal pain; cannot give NSAIDs due to CKD stage IIIB -  Gout -Complained of left shoulder pain, orthopedics consulted' likely from gout - Gout is a  possibility, uric acid elevated, continue Solu-Medrol  day 3 now -Solu-Medrol  changed to 40 mg IV daily   Esophageal dysphagia - History of aspiration like episode prior to admission - Continue PPI, refer to GI - continue carafate    Paroxysmal atrial fibrillation - Currently in sinus rhythm, continue amiodarone , Eliquis    History of VT Continue amiodarone    OSA: intolerant even of new CPAP machine at home  - CPAP nightly   AKI on stage IIIb CKD: SCr on admission is 3.2 from baseline 2.7 - Improved and stable now at baseline of 2.5-2.7   IDT2DM with steroid induced hyperglycemia:  - Continue Semglee  - FU with endocrinology, Dr. Trixie.    HLD:  - Continue statin   Depression:  - Continue SSRI   BPH:  - Continue tamsulosin       Medications     amiodarone   400 mg Oral Daily   apixaban   5 mg Oral BID   arformoterol   15 mcg Nebulization BID   atorvastatin   10 mg Oral Daily   budesonide  (PULMICORT ) nebulizer solution  0.5 mg Nebulization BID   clonazePAM   0.5 mg Oral BID   colchicine   0.3 mg Oral Q M,W,F   famotidine   20 mg Oral QHS   gabapentin   300 mg Oral QHS   insulin  aspart  0-20 Units Subcutaneous TID WC   insulin  aspart  0-5 Units Subcutaneous QHS   insulin  aspart  8 Units Subcutaneous TID WC   insulin  glargine-yfgn  60 Units Subcutaneous BID   methylPREDNISolone  (SOLU-MEDROL ) injection  40 mg Intravenous Q12H   metoprolol  succinate  12.5 mg Oral Daily   pantoprazole  (PROTONIX ) IV  40 mg Intravenous Q12H   polyethylene  glycol  17 g Oral Daily   potassium chloride  SA  40 mEq Oral Daily   revefenacin   175 mcg Nebulization Daily   sertraline   75 mg Oral q AM   sodium chloride  flush  3 mL Intravenous Q12H   sucralfate   1 g Oral TID WC & HS   tamsulosin   0.8 mg Oral Daily   timolol   1 drop Both Eyes q morning   torsemide   40 mg Oral Daily     Data Reviewed:   CBG:  Recent Labs  Lab 12/30/23 0617 12/30/23 1105 12/30/23 1602 12/30/23 2051  12/31/23 0615  GLUCAP 265* 211* 262* 209* 171*    SpO2: 93 % O2 Flow Rate (L/min): 2 L/min    Vitals:   12/31/23 0424 12/31/23 0708 12/31/23 0710 12/31/23 0734  BP: 122/67   136/76  Pulse: 64   64  Resp: 17   20  Temp: 98.6 F (37 C)   98.7 F (37.1 C)  TempSrc: Oral   Oral  SpO2: 95% 95% 95% 93%  Weight: 131 kg     Height:          Data Reviewed:  Basic Metabolic Panel: Recent Labs  Lab 12/25/23 0204 12/26/23 0228 12/27/23 0851 12/28/23 0302 12/29/23 0143 12/30/23 0228 12/31/23 0231  NA 138   < > 136 140 134* 135 138  K 3.3*   < > 3.7 3.4* 4.0 4.2 4.4  CL 96*   < > 97* 95* 100 99 103  CO2 29   < > 25 30 23 24 26   GLUCOSE 156*   < > 184* 155* 274* 265* 226*  BUN 57*   < > 54* 52* 51* 55* 60*  CREATININE 2.57*   < > 2.69* 2.68* 2.81* 2.63* 2.46*  CALCIUM  9.3   < > 9.2 9.3 8.7* 8.5* 8.8*  MG 2.1  --   --   --   --   --   --    < > = values in this interval not displayed.    CBC: Recent Labs  Lab 12/27/23 0851 12/28/23 0302 12/29/23 0143 12/30/23 0228 12/31/23 0231  WBC 10.0 11.0* 15.1* 14.3* 12.4*  HGB 14.3 14.3 12.2* 11.8* 12.2*  HCT 43.3 43.9 36.6* 35.4* 37.3*  MCV 93.3 94.4 93.1 93.2 93.0  PLT 274 274 270 268 290    LFT Recent Labs  Lab 12/27/23 0851  AST 37  ALT 47*  ALKPHOS 81  BILITOT 0.7  PROT 8.1  ALBUMIN  3.9     Antibiotics: Anti-infectives (From admission, onward)    Start     Dose/Rate Route Frequency Ordered Stop   12/20/23 1900  amoxicillin -clavulanate (AUGMENTIN ) 875-125 MG per tablet 1 tablet  Status:  Discontinued        1 tablet Oral Every 12 hours 12/20/23 1811 12/23/23 1220        DVT prophylaxis: Apixaban   Code Status: DNR  Family Communication:    CONSULTS cardiology, neurology   Subjective   Continues to have left-sided chest pain greater than   Objective    Physical Examination:   General-appears in no acute distress Heart-S1-S2, regular, no murmur auscultated Lungs-clear to auscultation  bilaterally, no wheezing or crackles auscultated Abdomen-soft, nontender, no organomegaly Extremities-no edema in the lower extremities Neuro-alert, oriented x3, no focal deficit noted  Status is: Inpatient:             Sabas GORMAN Brod   [facility] If 7PM-7AM, please contact night-coverage at [email or web address],  Office  [phone]   12/31/2023, 8:07 AM  LOS: 12 days

## 2023-12-31 NOTE — Plan of Care (Signed)

## 2023-12-31 NOTE — Progress Notes (Signed)
 NAME:  [name], MRN:  [id number], DOB:  [birth year], LOS: 12 ADMISSION DATE:  12/19/2023, CONSULTATION DATE:  12/28/2023 REFERRING MD:  SHAUNNA Pac MD, CHIEF COMPLAINT:  Chest pain   History of Present Illness:   The patient, with COPD,asthma, OSA, CAD, cardiomyopathy and atrial fibrillation, presents with shortness of breath and heart failure. She has been diuresed adequately and cardiology signed off. PCCM consulted for new onset chest pain for 3 days  Chest pain - Severe, left-sided chest pain for three days - Pain described as 'unbearable' and prevents daily activities, including getting up to go to the bathroom - Radiates to the back - Exacerbated by movement and coughing - No recent falls or trauma - Denies association with heartburn  Dyspnea and wheezing - Shortness of breath ongoing, with slight improvement after diuresis for heart failure and volume overload - Wheezing present for the past few weeks - Unable to tolerate CPAP for severe sleep apnea at home or in clinic - Currently using Symbicort  inhaler as outpatient; started on Breo during this visit  Cough and sputum production - Coughing up thick brown matter following an episode of choking on water, which preceded current symptoms  Gastroesophageal reflux disease - Currently on famotidine  for acid reflux, but has not received it during this hospital stay - Previously on Protonix , but it is not currently ordered  Pertinent  Medical History    has a past medical history of AICD (automatic cardioverter/defibrillator) present, Chronic bronchitis (HCC), Chronic systolic (congestive) heart failure (HCC), Fracture of left humerus, GERD (gastroesophageal reflux disease), Heart murmur, History of renal calculi, Hypertension, Kidney stones, NICM (nonischemic cardiomyopathy) (HCC), Obesity, 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   Significant Hospital Events: Including procedures, antibiotic start and stop dates in addition to other pertinent events   7/12 Admit 7/21 PCCM consult  Interim History / Subjective:   Feels worse today, breathing specifically is worse. Difficult for pt to qualify further, stating he just doesn't feel at his baseline  Endorses intermittent chest pain, variable duration variable intensity. Not associated w breathing changes per pt   Had a panic attack this morning that got better w O2  Did not wear CPAP, felt claustrophobic w mask  Wants to go home   Objective    Blood pressure 127/82, pulse 65, temperature 97.8 F (36.6 C), temperature source Oral, resp. rate 18, height 6' 0.99 (1.854 m), weight 131 kg, SpO2 95%.        Intake/Output Summary (Last 24 hours) at 12/31/2023 1237 Last data filed at 12/31/2023 1200 Gross per 24 hour  Intake 712 ml  Output 2425 ml  Net -1713 ml   Filed Weights   12/29/23 0457 12/30/23 0442 12/31/23 0424  Weight: 130.1 kg 130.5 kg 131 kg    Examination: Gen:      Wdwn middle aged M NAD  HEENT:  NCAT pink mm  Lungs:   CTAb  CV:         rr s1s2  Abd:      Soft ndnt  Ext:    No obvious joint deformity. No pitting extremity edema  Neuro:     AAOx4 no focal def  Psych: slightly anxious   Labs/Imaging reviewed VQ scan in 7/20-negative for PE CT scan on 7/20-with no significant intrathoracic abnormalities  Resolved problem list   Assessment and Plan  Atypical chest pain - cardiac etiology was ruled out, no PE. Question of pulm etiology but seems less likely as more time transpires. Query MSK, GI   P -PRN analgesia  -reflux as below -COPS/ OSA as below  COPD  Severe OSA  P -triple therapy  -change from BID to daily starting 7/25 -seems intolerant of CPAP -- gets very anxious. Can cont to try but I think the masks in hospital are going to be a barrier to  adherence  -Wean O2, goal > 88. Per pt was put on O2 for panic attack this morning   Afib  HF mr EF -eliquis   -amio -metop  -torsemide   Acid Reflux -sucralfate  PPI Pepcid   Anxiety -clonazepam    Best Practice (right click and Reselect all SmartList Selections daily)   Per primary team  Signature:    Ronnald Gave MSN, AGACNP-BC [facility] Amion for pager  12/31/2023, 12:37 PM

## 2024-01-01 DIAGNOSIS — J441 Chronic obstructive pulmonary disease with (acute) exacerbation: Secondary | ICD-10-CM | POA: Diagnosis not present

## 2024-01-01 LAB — BASIC METABOLIC PANEL WITH GFR
Anion gap: 12 (ref 5–15)
BUN: 58 mg/dL — ABNORMAL HIGH (ref 6–20)
CO2: 27 mmol/L (ref 22–32)
Calcium: 9 mg/dL (ref 8.9–10.3)
Chloride: 101 mmol/L (ref 98–111)
Creatinine, Ser: 2.31 mg/dL — ABNORMAL HIGH (ref 0.61–1.24)
GFR, Estimated: 32 mL/min — ABNORMAL LOW (ref >60)
Glucose, Bld: 112 mg/dL — ABNORMAL HIGH (ref 70–99)
Potassium: 3.7 mmol/L (ref 3.5–5.1)
Sodium: 140 mmol/L (ref 135–145)

## 2024-01-01 LAB — GLUCOSE, CAPILLARY
Glucose-Capillary: 110 mg/dL — ABNORMAL HIGH (ref 70–99)
Glucose-Capillary: 88 mg/dL (ref 70–99)
Glucose-Capillary: 246 mg/dL — ABNORMAL HIGH (ref 70–99)
Glucose-Capillary: 298 mg/dL — ABNORMAL HIGH (ref 70–99)

## 2024-01-01 LAB — CBC
HCT: 41.7 % (ref 39.0–52.0)
Hemoglobin: 13.8 g/dL (ref 13.0–17.0)
MCH: 30.8 pg (ref 26.0–34.0)
MCHC: 33.1 g/dL (ref 30.0–36.0)
MCV: 93.1 fL (ref 80.0–100.0)
Platelets: 283 K/uL (ref 150–400)
RBC: 4.48 MIL/uL (ref 4.22–5.81)
RDW: 15.2 % (ref 11.5–15.5)
WBC: 11.9 K/uL — ABNORMAL HIGH (ref 4.0–10.5)
nRBC: 0 % (ref 0.0–0.2)

## 2024-01-01 LAB — TROPONIN I (HIGH SENSITIVITY): Troponin I (High Sensitivity): 17 ng/L (ref <18)

## 2024-01-01 MED ORDER — PREDNISONE 20 MG PO TABS
50.0000 mg | ORAL_TABLET | Freq: Every day | ORAL | Status: DC
  Administered 2024-01-02 – 2024-01-05 (×4): 50 mg via ORAL
  Filled 2024-01-01 (×4): qty 1

## 2024-01-01 MED ORDER — METHOCARBAMOL 500 MG PO TABS
500.0000 mg | ORAL_TABLET | Freq: Two times a day (BID) | ORAL | Status: DC
  Administered 2024-01-01 – 2024-01-05 (×8): 500 mg via ORAL
  Filled 2024-01-01 (×8): qty 1

## 2024-01-01 NOTE — Progress Notes (Signed)
 Orthopedic Tech Progress Note Patient Details:  [name] 1965/11/24 [id number]  Ortho Devices Type of Ortho Device: Shoulder immobilizer Ortho Device/Splint Location: LUE Ortho Device/Splint Interventions: Ordered, Application, Adjustment   Post Interventions Patient Tolerated: Well Instructions Provided: Adjustment of device  [name] 01/01/2024, 4:55 PM

## 2024-01-01 NOTE — Progress Notes (Addendum)
 PROGRESS NOTE  [name]  FMW:[id number] DOB: Jun 26, 1965 DOA: 12/19/2023 PCP: Vicci Barnie NOVAK, MD  Consultants  Brief Narrative: 58/M w cardiac arrest status post AICD placement, NICM, atrial fibrillation on anticoagulation, T2DM, stage IIIb CKD  who presented to the ED on 12/19/2023 with worsening shortness of breath over the last couple of days. Work up in the ED included nonacute CXR, admitted with CHF exacerbation  - Cards following, improved with diuresis - 7/18, discharge completed,, cards signed off,  however subsequently reported left-sided chest pain, x-ray and troponins unremarkable - 7/19 AM, VQ scan ordered - 7/20, worsening chest pain, left-sided, pleuritic >VQ scan completed, low probability, noncontrast CT also unremarkable - 7/21, CRP elevated, started on IV steroids,?  Pleurisy, seen by pulmonary> suspected to be musculoskeletal -7/22: Pain extending to left shoulder and upper back, limited range of motion left shoulder> CT with severe glenohumeral osteoarthritis   Assessment & Plan: Acute on chronic systolic CHF -Repeat echo with EF 40-45%, moderate LVH, grade 1 DD, normal RV -GDMT limited by CKD - Improved with diuresis, 14 L negative - Now euvolemic, transitioned to oral torsemide , continue Toprol , now p.o. intake is poor will decrease torsemide  dose -GDMT limited by CKD Stage III/IV   Left lateral chest wall pain Now severe left shoulder pain, limited ROM - Now main complaint.  No rash noted on chest, Left sided chest/shoulder pain - New onset since 7/18, chest x-ray negative, troponins flat - VQ scan no PE -CT chest noncontrast also unremarkable -2D echo last week with stable cardiomyopathy, no pericardial effusion - Started him on steroids 7/21 for suspicion of pleurisy, CRP moderately elevated, ESR mildly up - Pulmonology consulted, no new recommendations -Recommend to treat musculoskeletal pain; cannot give NSAIDs due to CKD stage IIIB -  Life-threatening causes ruled out-->no PE, cardiac ischemia, CT chest without abnormalities other than severe shoulder pain.  No ST elevation consistent with pericarditis, EKG also not consistent with this   Gout -Complained of left shoulder pain, orthopedics consulted' likely from gout - Gout is a possibility, uric acid elevated, continue Solu-Medrol  day 3 now -Solu-Medrol  changed to 40 mg IV daily-->switching to prednisone  today  Leukocytosis:  - Presently fairly consistently since April of this year - recommend repeat outpt to ensure resolution    Esophageal dysphagia - History of aspiration like episode prior to admission - Continue PPI, refer to GI - continue carafate    Paroxysmal atrial fibrillation - Currently in sinus rhythm, continue amiodarone , Eliquis    History of VT Continue amiodarone    OSA: intolerant even of new CPAP machine at home  - CPAP nightly   AKI on stage IIIb CKD: SCr on admission is 3.2 from baseline 2.7 - Improved and stable now at baseline of 2.5-2.7   IDT2DM with steroid induced hyperglycemia:  - Continue Semglee  - FU with endocrinology, Dr. Trixie.    HLD:  - Continue statin   Depression:  - Continue SSRI   BPH:  - Continue tamsulosin       DVT prophylaxis:   apixaban  (ELIQUIS ) tablet 5 mg  Code Status:   Code Status: Limited: Do not attempt resuscitation (DNR) -DNR-LIMITED -Do Not Intubate/DNI  Level of care: Progressive Status is: Inpatient  Consults called: pulmonology, cardiology, orthopedics   Subjective: Patient complaining of Left shoulder pain radiating to Left chest.  Reported dilaudid  was only thing that helped this previously.  Eating and drinking well otherwise.   Objective: Vitals:   01/01/24 0513 01/01/24 1017 01/01/24 1115 01/01/24 1549  BP: 111/62 111/62 117/77 122/78  Pulse: 65 65 64 64  Resp: 18  16 16   Temp: 98 F (36.7 C)  98.4 F (36.9 C) 97.9 F (36.6 C)  TempSrc: Oral  Oral Oral  SpO2: 94%  92% 96%   Weight: 130 kg     Height:        Intake/Output Summary (Last 24 hours) at 01/01/2024 1630 Last data filed at 01/01/2024 1300 Gross per 24 hour  Intake 118 ml  Output 3725 ml  Net -3607 ml   Filed Weights   12/30/23 0442 12/31/23 0424 01/01/24 0513  Weight: 130.5 kg 131 kg 130 kg   Body mass index is 37.82 kg/m.  Gen: 58 y.o. male seated on side of bed, awake, alert, complaining of pain Pulm: Non-labored breathing.  Clear to auscultation bilaterally.  CV: Regular rate and rhythm. No murmur, rub, or gallop. No JVD GI: Abdomen soft, non-tender, non-distended, with normoactive bowel sounds. No organomegaly or masses felt. Ext: Warm, no deformities Skin: No rashes, lesions  Neuro: Alert and oriented. No focal neurological deficits. Psych: Calm  Judgement and insight appear normal. Mood & affect appropriate.     I have personally reviewed the following labs and images: CBC: Recent Labs  Lab 12/28/23 0302 12/29/23 0143 12/30/23 0228 12/31/23 0231 01/01/24 0237  WBC 11.0* 15.1* 14.3* 12.4* 11.9*  HGB 14.3 12.2* 11.8* 12.2* 13.8  HCT 43.9 36.6* 35.4* 37.3* 41.7  MCV 94.4 93.1 93.2 93.0 93.1  PLT 274 270 268 290 283   BMP &GFR Recent Labs  Lab 12/28/23 0302 12/29/23 0143 12/30/23 0228 12/31/23 0231 01/01/24 0237  NA 140 134* 135 138 140  K 3.4* 4.0 4.2 4.4 3.7  CL 95* 100 99 103 101  CO2 30 23 24 26 27   GLUCOSE 155* 274* 265* 226* 112*  BUN 52* 51* 55* 60* 58*  CREATININE 2.68* 2.81* 2.63* 2.46* 2.31*  CALCIUM  9.3 8.7* 8.5* 8.8* 9.0   Estimated Creatinine Clearance: 48.7 mL/min (A) (by C-G formula based on SCr of 2.31 mg/dL (H)). Liver & Pancreas: Recent Labs  Lab 12/27/23 0851  AST 37  ALT 47*  ALKPHOS 81  BILITOT 0.7  PROT 8.1  ALBUMIN  3.9   No results for input(s): LIPASE, AMYLASE in the last 168 hours. No results for input(s): AMMONIA in the last 168 hours. Diabetic: No results for input(s): HGBA1C in the last 72 hours. Recent Labs  Lab  12/31/23 1612 12/31/23 2137 01/01/24 0630 01/01/24 1117 01/01/24 1551  GLUCAP 230* 266* 110* 88 246*   Cardiac Enzymes: No results for input(s): CKTOTAL, CKMB, CKMBINDEX, TROPONINI in the last 168 hours. No results for input(s): PROBNP in the last 8760 hours. Coagulation Profile: No results for input(s): INR, PROTIME in the last 168 hours. Thyroid  Function Tests: No results for input(s): TSH, T4TOTAL, FREET4, T3FREE, THYROIDAB in the last 72 hours. Lipid Profile: No results for input(s): CHOL, HDL, LDLCALC, TRIG, CHOLHDL, LDLDIRECT in the last 72 hours. Anemia Panel: No results for input(s): VITAMINB12, FOLATE, FERRITIN, TIBC, IRON, RETICCTPCT in the last 72 hours. Urine analysis:    Component Value Date/Time   COLORURINE STRAW (A) 10/08/2023 0952   APPEARANCEUR CLEAR 10/08/2023 0952   APPEARANCEUR Clear 07/17/2020 1655   LABSPEC 1.009 10/08/2023 0952   PHURINE 5.0 10/08/2023 0952   GLUCOSEU 50 (A) 10/08/2023 0952   HGBUR NEGATIVE 10/08/2023 0952   BILIRUBINUR NEGATIVE 10/08/2023 0952   BILIRUBINUR negative 01/30/2022 1705   BILIRUBINUR Negative 07/17/2020 1655   KETONESUR  NEGATIVE 10/08/2023 0952   PROTEINUR NEGATIVE 10/08/2023 0952   UROBILINOGEN 0.2 01/30/2022 1705   UROBILINOGEN 1.0 06/20/2013 1032   NITRITE NEGATIVE 10/08/2023 0952   LEUKOCYTESUR NEGATIVE 10/08/2023 0952   Sepsis Labs: Invalid input(s): PROCALCITONIN, LACTICIDVEN  Microbiology: No results found for this or any previous visit (from the past 240 hours).  Radiology Studies: No results found.  Scheduled Meds:  amiodarone   400 mg Oral Daily   apixaban   5 mg Oral BID   arformoterol   15 mcg Nebulization BID   atorvastatin   10 mg Oral Daily   budesonide  (PULMICORT ) nebulizer solution  0.5 mg Nebulization BID   clonazePAM   0.5 mg Oral BID   colchicine   0.3 mg Oral Q M,W,F   famotidine   20 mg Oral QHS   gabapentin   300 mg Oral QHS   insulin   aspart  0-20 Units Subcutaneous TID WC   insulin  aspart  0-5 Units Subcutaneous QHS   insulin  aspart  8 Units Subcutaneous TID WC   insulin  glargine-yfgn  60 Units Subcutaneous BID   methylPREDNISolone  (SOLU-MEDROL ) injection  40 mg Intravenous Daily   metoprolol  succinate  12.5 mg Oral Daily   pantoprazole  (PROTONIX ) IV  40 mg Intravenous Q12H   polyethylene glycol  17 g Oral Daily   potassium chloride  SA  40 mEq Oral Daily   revefenacin   175 mcg Nebulization Daily   sertraline   75 mg Oral q AM   sodium chloride  flush  3 mL Intravenous Q12H   sucralfate   1 g Oral TID WC & HS   tamsulosin   0.8 mg Oral Daily   timolol   1 drop Both Eyes q morning   torsemide   40 mg Oral Daily   Continuous Infusions:   LOS: 13 days   35 minutes with more than 50% spent in reviewing records, counseling patient/family and coordinating care.  Reyes VEAR Gaw, MD [facility] [email or web address] 01/01/2024, 4:30 PM

## 2024-01-01 NOTE — Plan of Care (Signed)
  Problem: Fluid Volume: Goal: Ability to maintain a balanced intake and output will improve Outcome: Progressing   Problem: Metabolic: Goal: Ability to maintain appropriate glucose levels will improve Outcome: Progressing   

## 2024-01-01 NOTE — Plan of Care (Signed)
  Problem: Fluid Volume: Goal: Ability to maintain a balanced intake and output will improve Outcome: Progressing   Problem: Health Behavior/Discharge Planning: Goal: Ability to manage health-related needs will improve Outcome: Progressing   Problem: Nutritional: Goal: Maintenance of adequate nutrition will improve Outcome: Progressing   Problem: Skin Integrity: Goal: Risk for impaired skin integrity will decrease Outcome: Progressing   Problem: Education: Goal: Knowledge of General Education information will improve Description: Including pain rating scale, medication(s)/side effects and non-pharmacologic comfort measures Outcome: Progressing   Problem: Activity: Goal: Risk for activity intolerance will decrease Outcome: Progressing

## 2024-01-02 DIAGNOSIS — J441 Chronic obstructive pulmonary disease with (acute) exacerbation: Secondary | ICD-10-CM | POA: Diagnosis not present

## 2024-01-02 LAB — GLUCOSE, CAPILLARY
Glucose-Capillary: 78 mg/dL (ref 70–99)
Glucose-Capillary: 264 mg/dL — ABNORMAL HIGH (ref 70–99)
Glucose-Capillary: 103 mg/dL — ABNORMAL HIGH (ref 70–99)
Glucose-Capillary: 191 mg/dL — ABNORMAL HIGH (ref 70–99)

## 2024-01-02 MED ORDER — PANTOPRAZOLE SODIUM 40 MG PO TBEC
40.0000 mg | DELAYED_RELEASE_TABLET | Freq: Two times a day (BID) | ORAL | Status: DC
  Administered 2024-01-02 – 2024-01-05 (×7): 40 mg via ORAL
  Filled 2024-01-02 (×7): qty 1

## 2024-01-02 NOTE — Progress Notes (Signed)
 Mobility Specialist Progress Note:    01/02/24 0920  Mobility  Activity Dangled on edge of bed (STS, Leg ext, leg curls x3)  Level of Assistance Standby assist, set-up cues, supervision of patient - no hands on  Assistive Device None  Activity Response Tolerated well  Mobility Referral Yes  Mobility visit 1 Mobility  Mobility Specialist Start Time (ACUTE ONLY) 0920  Mobility Specialist Stop Time (ACUTE ONLY) [id number]  Mobility Specialist Time Calculation (min) (ACUTE ONLY) 7 min   Pt reluctant but agreeable to session. Pt sats before session was sitting between 88%-91% [place]. Stated they had a rough night and difficulty breathing. Checked in w/ nursing who verified truth in pt statement. Pt agreeable to do bedside exercises. Left pt in bed w/ all needs met.   Venetia Keel Mobility Specialist Please Neurosurgeon or Rehab Office at [phone]

## 2024-01-02 NOTE — Plan of Care (Signed)
 Pt is walkie-talkie, polypharmacy. Seems well adjusted to meds, takes 22 PO meds at home. Pt is friendly, compliant and reasonable attitude.

## 2024-01-02 NOTE — Progress Notes (Signed)
 PROGRESS NOTE  [name]  FMW:[id number] DOB: 1966-02-15 DOA: 12/19/2023 PCP: Vicci Barnie NOVAK, MD  Consultants  Brief Narrative: 58/M w cardiac arrest status post AICD placement, NICM, atrial fibrillation on anticoagulation, T2DM, stage IIIb CKD  who presented to the ED on 12/19/2023 with worsening shortness of breath over the last couple of days.  Work up in the ED included nonacute CXR, admitted with CHF exacerbation.  Was moving towards discharge.    However, on 7/18, discharge completed,, cards signed off,  but pt subsequently reported left-sided chest pain.  X-ray and troponins unremarkable - 7/19 AM, VQ scan ordered, but CP continued, had neg non con CT.  - 7/21, CRP elevated, started on IV steroids for question of pleurisy.  Pulm consulted> suspected to be musculoskeletal -7/22: Pain extending to left shoulder and upper back, limited range of motion left shoulder> CT with severe glenohumeral osteoarthritis.  Ortho believes combination of severe OA plus possible gout contributing to pain. 7/26:  shoulder pain improving, but now with new LE edema Right leg   Assessment & Plan: Acute on chronic systolic CHF -Repeat echo with EF 40-45%, moderate LVH, grade 1 DD, normal RV - Improved with diuresis, 14 L negative - Now euvolemic, transitioned to oral torsemide , continue Toprol , now p.o. intake is poor will decrease torsemide  dose -GDMT limited by CKD Stage III/IV   Left lateral chest wall pain Now severe left shoulder pain, limited ROM - Now main complaint.  No rash noted on chest, Left sided chest/shoulder pain - New onset since 7/18, w/u thus far has been unremarkable, minus Left arm SVT noted on US  persistent from Nov 2024.  Echo w/o pericardial effusion,  -2D echo last week with stable cardiomyopathy, no pericardial effusion, No ST elevation consistent with pericarditis. - Pulmonology consulted, no new recommendations -Recommend to treat musculoskeletal pain; cannot give  NSAIDs due to CKD stage IIIB - Pain is better today with shoulder in sling.  Will continue to treat this as musculoskeletal plain to the improvement.  Right leg swelling: - New since admission. - He is on Eliquis .  There is +1 edema compared to trace to the left. - It is low probability as he is on Eliquis  but will obtain lower extremity vascular duplex to ensure no DVT.   Gout - Orthopedics consulted and reports that left shoulder pain likely combination of OA/gout. - Gout is a possibility, uric acid elevated, continue Solu-Medrol  day 3 now -Solu-Medrol  changed to 40 mg IV daily-->switching to prednisone  50 mg 7/25.  Leukocytosis:  - Present fairly consistently since April of this year - recommend repeat outpt to ensure resolution    Esophageal dysphagia - History of aspiration like episode prior to admission - Continue PPI, refer to GI outpatient - continue carafate  -Doing well thus far with this.   Paroxysmal atrial fibrillation - Currently in sinus rhythm, continue amiodarone , Eliquis    History of VT Continue amiodarone    OSA: intolerant even of new CPAP machine at home  - CPAP nightly   AKI on stage IIIb CKD: SCr on admission is 3.2 from baseline 2.7 - Improved and stable now at baseline of 2.5-2.7   IDT2DM with steroid induced hyperglycemia:  - Continue Semglee  - FU with endocrinology, Dr. Trixie.    HLD:  - Continue statin   Depression:  - Continue SSRI   BPH:  - Continue tamsulosin       DVT prophylaxis:   apixaban  (ELIQUIS ) tablet 5 mg  Code Status:   Code Status:  Limited: Do not attempt resuscitation (DNR) -DNR-LIMITED -Do Not Intubate/DNI  Level of care: Progressive Status is: Inpatient  Consults called: pulmonology, cardiology, orthopedics   Subjective: Left shoulder pain better today with sling application.  However he does report right leg swelling today.  Otherwise feels better than he has been.    Objective: Vitals:   01/02/24 0840  01/02/24 1200 01/02/24 1240 01/02/24 1625  BP: 119/65 123/65 123/65 123/80  Pulse: 71 69 83 79  Resp: 20 20    Temp: 97.7 F (36.5 C) 98 F (36.7 C) 98.4 F (36.9 C) 97.8 F (36.6 C)  TempSrc: Oral Oral Oral Oral  SpO2: (!) 87% 97% 98% 96%  Weight:      Height:        Intake/Output Summary (Last 24 hours) at 01/02/2024 1705 Last data filed at 01/02/2024 1422 Gross per 24 hour  Intake 846 ml  Output 3325 ml  Net -2479 ml   Filed Weights   12/31/23 0424 01/01/24 0513 01/02/24 0537  Weight: 131 kg 130 kg 129.5 kg   Body mass index is 37.67 kg/m.  Gen: 58 y.o. male seated on side of bed, awake, alert, complaining of pain Pulm: Non-labored breathing.  Clear to auscultation bilaterally.  CV: Regular rate and rhythm.  GI: Abdomen soft, non-tender, non-distended, with normoactive bowel sounds. No organomegaly or masses felt. Ext: Warm, no deformities, left arm in sling.  Able to move shoulder and more ranges of motion with sling than he was yesterday.  Left lower extremity with trace calf/pedal edema.  Patient with +1 calf/pedal edema on the right. Skin: No rashes, lesions  Neuro: Alert and oriented. No focal neurological deficits. Psych: Calm  Judgement and insight appear normal. Mood & affect appropriate.     I have personally reviewed the following labs and images: CBC: Recent Labs  Lab 12/28/23 0302 12/29/23 0143 12/30/23 0228 12/31/23 0231 01/01/24 0237  WBC 11.0* 15.1* 14.3* 12.4* 11.9*  HGB 14.3 12.2* 11.8* 12.2* 13.8  HCT 43.9 36.6* 35.4* 37.3* 41.7  MCV 94.4 93.1 93.2 93.0 93.1  PLT 274 270 268 290 283   BMP &GFR Recent Labs  Lab 12/28/23 0302 12/29/23 0143 12/30/23 0228 12/31/23 0231 01/01/24 0237  NA 140 134* 135 138 140  K 3.4* 4.0 4.2 4.4 3.7  CL 95* 100 99 103 101  CO2 30 23 24 26 27   GLUCOSE 155* 274* 265* 226* 112*  BUN 52* 51* 55* 60* 58*  CREATININE 2.68* 2.81* 2.63* 2.46* 2.31*  CALCIUM  9.3 8.7* 8.5* 8.8* 9.0   Estimated Creatinine  Clearance: 48.6 mL/min (A) (by C-G formula based on SCr of 2.31 mg/dL (H)). Liver & Pancreas: Recent Labs  Lab 12/27/23 0851  AST 37  ALT 47*  ALKPHOS 81  BILITOT 0.7  PROT 8.1  ALBUMIN  3.9   No results for input(s): LIPASE, AMYLASE in the last 168 hours. No results for input(s): AMMONIA in the last 168 hours. Diabetic: No results for input(s): HGBA1C in the last 72 hours. Recent Labs  Lab 01/01/24 1117 01/01/24 1551 01/01/24 2111 01/02/24 0624 01/02/24 1038  GLUCAP 88 246* 298* 78 264*   Cardiac Enzymes: No results for input(s): CKTOTAL, CKMB, CKMBINDEX, TROPONINI in the last 168 hours. No results for input(s): PROBNP in the last 8760 hours. Coagulation Profile: No results for input(s): INR, PROTIME in the last 168 hours. Thyroid  Function Tests: No results for input(s): TSH, T4TOTAL, FREET4, T3FREE, THYROIDAB in the last 72 hours. Lipid Profile: No results for  input(s): CHOL, HDL, LDLCALC, TRIG, CHOLHDL, LDLDIRECT in the last 72 hours. Anemia Panel: No results for input(s): VITAMINB12, FOLATE, FERRITIN, TIBC, IRON, RETICCTPCT in the last 72 hours. Urine analysis:    Component Value Date/Time   COLORURINE STRAW (A) 10/08/2023 0952   APPEARANCEUR CLEAR 10/08/2023 0952   APPEARANCEUR Clear 07/17/2020 1655   LABSPEC 1.009 10/08/2023 0952   PHURINE 5.0 10/08/2023 0952   GLUCOSEU 50 (A) 10/08/2023 0952   HGBUR NEGATIVE 10/08/2023 0952   BILIRUBINUR NEGATIVE 10/08/2023 0952   BILIRUBINUR negative 01/30/2022 1705   BILIRUBINUR Negative 07/17/2020 1655   KETONESUR NEGATIVE 10/08/2023 0952   PROTEINUR NEGATIVE 10/08/2023 0952   UROBILINOGEN 0.2 01/30/2022 1705   UROBILINOGEN 1.0 06/20/2013 1032   NITRITE NEGATIVE 10/08/2023 0952   LEUKOCYTESUR NEGATIVE 10/08/2023 0952   Sepsis Labs: Invalid input(s): PROCALCITONIN, LACTICIDVEN  Microbiology: No results found for this or any previous visit (from the past 240  hours).  Radiology Studies: No results found.  Scheduled Meds:  amiodarone   400 mg Oral Daily   apixaban   5 mg Oral BID   arformoterol   15 mcg Nebulization BID   atorvastatin   10 mg Oral Daily   budesonide  (PULMICORT ) nebulizer solution  0.5 mg Nebulization BID   clonazePAM   0.5 mg Oral BID   colchicine   0.3 mg Oral Q M,W,F   famotidine   20 mg Oral QHS   gabapentin   300 mg Oral QHS   insulin  aspart  0-20 Units Subcutaneous TID WC   insulin  aspart  0-5 Units Subcutaneous QHS   insulin  aspart  8 Units Subcutaneous TID WC   insulin  glargine-yfgn  60 Units Subcutaneous BID   methocarbamol   500 mg Oral BID   metoprolol  succinate  12.5 mg Oral Daily   pantoprazole   40 mg Oral BID   polyethylene glycol  17 g Oral Daily   potassium chloride  SA  40 mEq Oral Daily   predniSONE   50 mg Oral Q breakfast   revefenacin   175 mcg Nebulization Daily   sertraline   75 mg Oral q AM   sodium chloride  flush  3 mL Intravenous Q12H   sucralfate   1 g Oral TID WC & HS   tamsulosin   0.8 mg Oral Daily   timolol   1 drop Both Eyes q morning   torsemide   40 mg Oral Daily   Continuous Infusions:   LOS: 14 days   35 minutes with more than 50% spent in reviewing records, counseling patient/family and coordinating care.  Reyes VEAR Gaw, MD [facility] [email or web address] 01/02/2024, 5:05 PM

## 2024-01-03 ENCOUNTER — Inpatient Hospital Stay (HOSPITAL_COMMUNITY)

## 2024-01-03 DIAGNOSIS — J441 Chronic obstructive pulmonary disease with (acute) exacerbation: Secondary | ICD-10-CM | POA: Diagnosis not present

## 2024-01-03 DIAGNOSIS — M7989 Other specified soft tissue disorders: Secondary | ICD-10-CM

## 2024-01-03 LAB — BASIC METABOLIC PANEL WITH GFR
Anion gap: 13 (ref 5–15)
BUN: 58 mg/dL — ABNORMAL HIGH (ref 6–20)
CO2: 27 mmol/L (ref 22–32)
Calcium: 9 mg/dL (ref 8.9–10.3)
Chloride: 102 mmol/L (ref 98–111)
Creatinine, Ser: 2.44 mg/dL — ABNORMAL HIGH (ref 0.61–1.24)
GFR, Estimated: 30 mL/min — ABNORMAL LOW (ref >60)
Glucose, Bld: 75 mg/dL (ref 70–99)
Potassium: 3.4 mmol/L — ABNORMAL LOW (ref 3.5–5.1)
Sodium: 142 mmol/L (ref 135–145)

## 2024-01-03 LAB — GLUCOSE, CAPILLARY
Glucose-Capillary: 99 mg/dL (ref 70–99)
Glucose-Capillary: 115 mg/dL — ABNORMAL HIGH (ref 70–99)
Glucose-Capillary: 117 mg/dL — ABNORMAL HIGH (ref 70–99)
Glucose-Capillary: 118 mg/dL — ABNORMAL HIGH (ref 70–99)

## 2024-01-03 LAB — CBC
HCT: 38.8 % — ABNORMAL LOW (ref 39.0–52.0)
Hemoglobin: 12.6 g/dL — ABNORMAL LOW (ref 13.0–17.0)
MCH: 30.1 pg (ref 26.0–34.0)
MCHC: 32.5 g/dL (ref 30.0–36.0)
MCV: 92.6 fL (ref 80.0–100.0)
Platelets: 311 K/uL (ref 150–400)
RBC: 4.19 MIL/uL — ABNORMAL LOW (ref 4.22–5.81)
RDW: 15.1 % (ref 11.5–15.5)
WBC: 14.2 K/uL — ABNORMAL HIGH (ref 4.0–10.5)
nRBC: 0 % (ref 0.0–0.2)

## 2024-01-03 MED ORDER — INSULIN GLARGINE-YFGN 100 UNIT/ML ~~LOC~~ SOLN
50.0000 [IU] | Freq: Two times a day (BID) | SUBCUTANEOUS | Status: DC
  Administered 2024-01-03: 50 [IU] via SUBCUTANEOUS
  Administered 2024-01-04: 40 [IU] via SUBCUTANEOUS
  Filled 2024-01-03 (×3): qty 0.5

## 2024-01-03 MED ORDER — ENOXAPARIN SODIUM 150 MG/ML IJ SOSY
130.0000 mg | PREFILLED_SYRINGE | Freq: Two times a day (BID) | INTRAMUSCULAR | Status: DC
  Administered 2024-01-03 – 2024-01-04 (×2): 130 mg via SUBCUTANEOUS
  Filled 2024-01-03 (×3): qty 0.86

## 2024-01-03 NOTE — Progress Notes (Signed)
 PROGRESS NOTE  [name]  FMW:[id number] DOB: 03/15/1966 DOA: 12/19/2023 PCP: Vicci Barnie NOVAK, MD  Consultants  Brief Narrative: 58/M w cardiac arrest status post AICD placement, NICM, atrial fibrillation on anticoagulation, T2DM, stage IIIb CKD  who presented to the ED on 12/19/2023 with worsening shortness of breath over the last couple of days.  Work up in the ED included nonacute CXR, admitted with CHF exacerbation.  Was moving towards discharge.    However, on 7/18, discharge completed,, cards signed off,  but pt subsequently reported left-sided chest pain.  X-ray and troponins unremarkable - 7/19 AM, VQ scan ordered, but CP continued, had neg non con CT.  - 7/21, CRP elevated, started on IV steroids for question of pleurisy.  Pulm consulted> suspected to be musculoskeletal -7/22: Pain extending to left shoulder and upper back, limited range of motion left shoulder> CT with severe glenohumeral osteoarthritis.  Ortho believes combination of severe OA plus possible gout contributing to pain. 7/26:  shoulder pain improving, but now with new LE edema Right leg 7/27:  shoulder pain has returned again today.  Acute DVT noted in Right peroneal veins.    Assessment & Plan: Acute on chronic systolic CHF -Reason for admission, much improved.  Repeat echo with EF 40-45%, moderate LVH, grade 1 DD, normal RV - Improved with diuresis, 14 L negative - Now euvolemic, transitioned to oral torsemide , continue Toprol  -GDMT limited by CKD Stage III/IV   Acute DVT:  - New, acute DVT Left leg.  Reported new onset Left leg swelling yesterday.  US  revealed same today.   - will start tx dose lovenox  switching from Eliquis  and consult hematology for any further recommendations in light of this new DVT and question of Eliquis  failure.  Left lateral chest wall pain Now severe left shoulder pain, limited ROM - Main complaint that is To her in the hospital this past week.  No rash noted on chest, Left  sided chest/shoulder pain - New onset since 7/18, w/u thus far has been unremarkable, minus Left arm SVT noted on US  persistent from Nov 2024.  Echo w/o pericardial effusion,  -2D echo last week with stable cardiomyopathy, no pericardial effusion, No ST elevation consistent with pericarditis. - Pulmonology consulted, no new recommendations -Recommend to treat musculoskeletal pain; cannot give NSAIDs due to CKD stage IIIB - Worse today than yesterday but not as bad as it has been.  Will continue with shoulder in sling and follow-up outpatient with orthopedics.  Hypoglycemic episode:  - Reports awaking on the night shaky, sweating.  Ate some crackers and juice and blood sugar afterwards was 99. - I am going to cut back on his Semglee  and we will increase daily insulin  with meals.  Gout - Orthopedics consulted and reports that left shoulder pain likely combination of OA/gout. - Gout is a possibility, uric acid elevated, continue Solu-Medrol  day 3 now -Solu-Medrol  changed to 40 mg IV daily-->switching to prednisone  50 mg 7/25.  Leukocytosis:  - Present fairly consistently since April of this year - recommend repeat outpt to ensure resolution    Esophageal dysphagia - History of aspiration like episode prior to admission - Continue PPI, refer to GI outpatient - continue carafate  -Doing well thus far with this.   Paroxysmal atrial fibrillation - Currently in sinus rhythm, continue amiodarone , Eliquis    History of VT Continue amiodarone    OSA: intolerant even of new CPAP machine at home  - CPAP nightly   AKI on stage IIIb CKD: SCr on  admission is 3.2 from baseline 2.7 - Improved and stable now at baseline of 2.5-2.7   IDT2DM with steroid induced hyperglycemia:  - Continue Semglee  - FU with endocrinology, Dr. Trixie.    HLD:  - Continue statin   Depression:  - Continue SSRI   BPH:  - Continue tamsulosin     DVT prophylaxis:   apixaban  (ELIQUIS ) tablet 5 mg  Code Status:    Code Status: Limited: Do not attempt resuscitation (DNR) -DNR-LIMITED -Do Not Intubate/DNI  Level of care: Progressive Status is: Inpatient  Consults called: pulmonology, cardiology, orthopedics   Subjective: Left shoulder pain little worse today than it has been.  Still with right unilateral leg swelling without pain.  Otherwise doing well without complaints.  Objective: Vitals:   01/03/24 0025 01/03/24 0537 01/03/24 0758 01/03/24 1121  BP: 117/70 116/75 131/79 120/72  Pulse: 64 63 62 66  Resp: 16 19 18 16   Temp: 97.7 F (36.5 C) 97.8 F (36.6 C) 97.8 F (36.6 C) 98 F (36.7 C)  TempSrc: Oral Oral Oral Oral  SpO2: 99% 96% 96% 93%  Weight:  131.2 kg    Height:        Intake/Output Summary (Last 24 hours) at 01/03/2024 1549 Last data filed at 01/03/2024 1400 Gross per 24 hour  Intake 965 ml  Output 2975 ml  Net -2010 ml   Filed Weights   01/01/24 0513 01/02/24 0537 01/03/24 0537  Weight: 130 kg 129.5 kg 131.2 kg   Body mass index is 38.17 kg/m.  Gen: 58 y.o. male seated on side of bed, awake, alert, complaining of pain Pulm: Non-labored breathing.  Clear to auscultation bilaterally.  CV: Regular rate and rhythm.  GI: Abdomen soft, non-tender, non-distended, with normoactive bowel sounds. No organomegaly or masses felt. Ext: Warm, no deformities, left arm in sling.  Able to move shoulder and more ranges of motion with sling than he was yesterday.  Left lower extremity with trace calf/pedal edema.  Patient with +1 calf/pedal edema on the right. Skin: No rashes, lesions  Neuro: Alert and oriented. No focal neurological deficits. Psych: Calm  Judgement and insight appear normal. Mood & affect appropriate.     I have personally reviewed the following labs and images: CBC: Recent Labs  Lab 12/29/23 0143 12/30/23 0228 12/31/23 0231 01/01/24 0237 01/03/24 0228  WBC 15.1* 14.3* 12.4* 11.9* 14.2*  HGB 12.2* 11.8* 12.2* 13.8 12.6*  HCT 36.6* 35.4* 37.3* 41.7 38.8*   MCV 93.1 93.2 93.0 93.1 92.6  PLT 270 268 290 283 311   BMP &GFR Recent Labs  Lab 12/29/23 0143 12/30/23 0228 12/31/23 0231 01/01/24 0237 01/03/24 0228  NA 134* 135 138 140 142  K 4.0 4.2 4.4 3.7 3.4*  CL 100 99 103 101 102  CO2 23 24 26 27 27   GLUCOSE 274* 265* 226* 112* 75  BUN 51* 55* 60* 58* 58*  CREATININE 2.81* 2.63* 2.46* 2.31* 2.44*  CALCIUM  8.7* 8.5* 8.8* 9.0 9.0   Estimated Creatinine Clearance: 46.3 mL/min (A) (by C-G formula based on SCr of 2.44 mg/dL (H)). Liver & Pancreas: No results for input(s): AST, ALT, ALKPHOS, BILITOT, PROT, ALBUMIN  in the last 168 hours.  No results for input(s): LIPASE, AMYLASE in the last 168 hours. No results for input(s): AMMONIA in the last 168 hours. Diabetic: No results for input(s): HGBA1C in the last 72 hours. Recent Labs  Lab 01/02/24 1038 01/02/24 1711 01/02/24 2106 01/03/24 0556 01/03/24 1120  GLUCAP 264* 103* 191* 99 115*  Cardiac Enzymes: No results for input(s): CKTOTAL, CKMB, CKMBINDEX, TROPONINI in the last 168 hours. No results for input(s): PROBNP in the last 8760 hours. Coagulation Profile: No results for input(s): INR, PROTIME in the last 168 hours. Thyroid  Function Tests: No results for input(s): TSH, T4TOTAL, FREET4, T3FREE, THYROIDAB in the last 72 hours. Lipid Profile: No results for input(s): CHOL, HDL, LDLCALC, TRIG, CHOLHDL, LDLDIRECT in the last 72 hours. Anemia Panel: No results for input(s): VITAMINB12, FOLATE, FERRITIN, TIBC, IRON, RETICCTPCT in the last 72 hours. Urine analysis:    Component Value Date/Time   COLORURINE STRAW (A) 10/08/2023 0952   APPEARANCEUR CLEAR 10/08/2023 0952   APPEARANCEUR Clear 07/17/2020 1655   LABSPEC 1.009 10/08/2023 0952   PHURINE 5.0 10/08/2023 0952   GLUCOSEU 50 (A) 10/08/2023 0952   HGBUR NEGATIVE 10/08/2023 0952   BILIRUBINUR NEGATIVE 10/08/2023 0952   BILIRUBINUR negative 01/30/2022  1705   BILIRUBINUR Negative 07/17/2020 1655   KETONESUR NEGATIVE 10/08/2023 0952   PROTEINUR NEGATIVE 10/08/2023 0952   UROBILINOGEN 0.2 01/30/2022 1705   UROBILINOGEN 1.0 06/20/2013 1032   NITRITE NEGATIVE 10/08/2023 0952   LEUKOCYTESUR NEGATIVE 10/08/2023 0952   Sepsis Labs: Invalid input(s): PROCALCITONIN, LACTICIDVEN  Microbiology: No results found for this or any previous visit (from the past 240 hours).  Radiology Studies: VAS US  LOWER EXTREMITY VENOUS (DVT) Result Date: 01/03/2024  Lower Venous DVT Study Patient Name:  [name]  Date of Exam:   01/03/2024 Medical Rec #: [id number]       Accession #:    [id number] Date of Birth: [birth year]       Patient Gender: M Patient Age:   58 years Exam Location:  [facility] Procedure:      VAS US  LOWER EXTREMITY VENOUS (DVT) Referring Phys: Oriyah Lamphear --------------------------------------------------------------------------------  Indications: Edema.  Risk Factors: Immobility-has been inpatient for 16 days. Anticoagulation: Eliquis . Comparison Study: No prior study on file Performing Technologist: Alberta Lis RVS  Examination Guidelines: A complete evaluation includes B-mode imaging, spectral Doppler, color Doppler, and power Doppler as needed of all accessible portions of each vessel. Bilateral testing is considered an integral part of a complete examination. Limited examinations for reoccurring indications may be performed as noted. The reflux portion of the exam is performed with the patient in reverse Trendelenburg.  +---------+---------------+---------+-----------+----------+--------------+ RIGHT    CompressibilityPhasicitySpontaneityPropertiesThrombus Aging +---------+---------------+---------+-----------+----------+--------------+ CFV      Full           Yes      Yes                                 +---------+---------------+---------+-----------+----------+--------------+ SFJ      Full                                                         +---------+---------------+---------+-----------+----------+--------------+ FV Prox  Full           Yes      Yes                                 +---------+---------------+---------+-----------+----------+--------------+ FV Mid   Full                                                        +---------+---------------+---------+-----------+----------+--------------+  FV DistalFull           Yes      Yes                                 +---------+---------------+---------+-----------+----------+--------------+ PFV      Full                                                        +---------+---------------+---------+-----------+----------+--------------+ POP      Full           Yes      Yes                                 +---------+---------------+---------+-----------+----------+--------------+ PTV      Full                                                        +---------+---------------+---------+-----------+----------+--------------+ PERO     None           No       No                   Acute          +---------+---------------+---------+-----------+----------+--------------+ Gastroc  Full                                                        +---------+---------------+---------+-----------+----------+--------------+   +----+---------------+---------+-----------+----------+--------------+ LEFTCompressibilityPhasicitySpontaneityPropertiesThrombus Aging +----+---------------+---------+-----------+----------+--------------+ CFV Full           Yes      Yes                                 +----+---------------+---------+-----------+----------+--------------+ SFJ Full                                                        +----+---------------+---------+-----------+----------+--------------+    Summary: RIGHT: - Findings consistent with acute deep vein thrombosis involving the right peroneal veins.  - A cystic  structure is found in the popliteal fossa.  LEFT: - No evidence of common femoral vein obstruction.   *See table(s) above for measurements and observations.    Preliminary     Scheduled Meds:  amiodarone   400 mg Oral Daily   apixaban   5 mg Oral BID   arformoterol   15 mcg Nebulization BID   atorvastatin   10 mg Oral Daily   budesonide  (PULMICORT ) nebulizer solution  0.5 mg Nebulization BID   clonazePAM   0.5 mg Oral BID   colchicine   0.3 mg Oral Q M,W,F   famotidine   20 mg Oral QHS   gabapentin   300 mg Oral QHS   insulin   aspart  0-20 Units Subcutaneous TID WC   insulin  aspart  0-5 Units Subcutaneous QHS   insulin  aspart  8 Units Subcutaneous TID WC   insulin  glargine-yfgn  60 Units Subcutaneous BID   methocarbamol   500 mg Oral BID   metoprolol  succinate  12.5 mg Oral Daily   pantoprazole   40 mg Oral BID   polyethylene glycol  17 g Oral Daily   potassium chloride  SA  40 mEq Oral Daily   predniSONE   50 mg Oral Q breakfast   revefenacin   175 mcg Nebulization Daily   sertraline   75 mg Oral q AM   sodium chloride  flush  3 mL Intravenous Q12H   sucralfate   1 g Oral TID WC & HS   tamsulosin   0.8 mg Oral Daily   timolol   1 drop Both Eyes q morning   torsemide   40 mg Oral Daily   Continuous Infusions:   LOS: 15 days   35 minutes with more than 50% spent in reviewing records, counseling patient/family and coordinating care.  Reyes VEAR Gaw, MD [facility] [email or web address] 01/03/2024, 3:49 PM

## 2024-01-03 NOTE — Progress Notes (Addendum)
 PHARMACY - ANTICOAGULATION CONSULT NOTE  Pharmacy Consult for Enoxaparin  Indication: VTE treatment  Allergies  Allergen Reactions   Bee Venom Anaphylaxis   Trulicity  [Dulaglutide ] Anaphylaxis and Diarrhea    Extra dose of Trulicity  caused cardiac arrest. Patient experience onset of diarrhea with using this medication - cleared after med stopped.    Patient Measurements: Height: 6' 0.99 (185.4 cm) Weight: 131.2 kg (289 lb 3.9 oz) IBW/kg (Calculated) : 79.88 HEPARIN  DW (KG): 108.7  Vital Signs: Temp: 98 F (36.7 C) (07/27 1623) Temp Source: Oral (07/27 1623) BP: 120/73 (07/27 1623) Pulse Rate: 61 (07/27 1623)  Labs: Recent Labs    01/01/24 0237 01/01/24 1306 01/03/24 0228  HGB 13.8  --  12.6*  HCT 41.7  --  38.8*  PLT 283  --  311  CREATININE 2.31*  --  2.44*  TROPONINIHS  --  17  --     Estimated Creatinine Clearance: 46.3 mL/min (A) (by C-G formula based on SCr of 2.44 mg/dL (H)).   Medical History: Past Medical History:  Diagnosis Date   AICD (automatic cardioverter/defibrillator) present    Chronic bronchitis (HCC)    get it most q yr (12/23/2013)   Chronic systolic (congestive) heart failure (HCC)    Fracture of left humerus    a. 07/2013.   GERD (gastroesophageal reflux disease)    not at present time   Heart murmur    born w/it    History of renal calculi    Hypertension    Kidney stones    NICM (nonischemic cardiomyopathy) (HCC)    a. 07/2013 Echo: EF 20-25%, diff HK, Gr2 DD, mild MR, mod dil LA/RA. 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   12/19/2023 Morning   amiodarone  (PACERONE ) 200 MG tablet Take 2 tablets (400 mg total) by mouth daily. 180 tablet 3 12/19/2023 Morning   apixaban  (ELIQUIS ) 5 MG TABS tablet Take 1 tablet (5 mg total) by mouth 2 (two) times daily. 60 tablet 3 12/19/2023 at  2:00 PM   atorvastatin  (LIPITOR) 10 MG tablet Take 1 tablet (10 mg total) by mouth daily. 90 tablet 2 12/18/2023 Bedtime   benzonatate  (TESSALON ) 200 MG capsule Take 200 mg by mouth 2 (two) times daily as needed for cough.   12/18/2023 Morning   cetirizine  (EQ ALLERGY RELIEF, CETIRIZINE ,) 10 MG tablet Take 1 tablet (10 mg total) by mouth daily. 90 tablet 1 12/19/2023 Morning   cholecalciferol  (VITAMIN D3) 25 MCG (1000 UNIT) tablet Take 1,000 Units by mouth in the morning.   12/19/2023 Morning   clonazePAM  (KLONOPIN ) 0.5 MG tablet Take 1 tablet (0.5 mg total) by mouth 2 (two) times daily as needed for anxiety. (Patient taking differently: Take 0.5 mg by mouth 2 (two) times daily.) 60 tablet 1 12/19/2023 Morning   Clotrimazole  1 % OINT Apply to the affected area twice a day (Patient taking differently: Apply 1 Application topically 2 (two) times daily as needed. Apply to the affected area twice a day) 56.7 g 0 12/04/2023   colchicine  0.6 MG tablet  1/2 tab PO Q Mon/Wed/Fri (Patient taking differently: Take 0.5 tablets by mouth See admin instructions. 1/2 tab PO Q Mon/Wed/Fri) 30 tablet 1 12/16/2023   EPINEPHrine  0.3 mg/0.3 mL IJ SOAJ injection Inject 0.3 mg into the muscle as needed. (Patient taking differently: Inject 0.3 mg into the muscle as needed for anaphylaxis.) 1 each 1 Unknown   famotidine  (PEPCID ) 20 MG tablet TAKE 1 TABLET BY MOUTH 1 TO 2 TIMES DAILY AS NEEDED 180 tablet 0 12/19/2023 Morning   fluticasone  (FLONASE ) 50 MCG/ACT nasal spray Place 2 sprays into both nostrils daily. 16 g 6 12/19/2023 Morning   gabapentin  (NEURONTIN ) 300 MG capsule Take 1 capsule (300 mg total) by mouth at bedtime. 90 capsule 1 12/18/2023 Bedtime   insulin   aspart (NOVOLOG  FLEXPEN) 100 UNIT/ML FlexPen Inject 10-20 Units into the skin 3 (three) times daily before meals. (Patient taking differently: Inject 34 Units into the skin 3 (three) times daily before meals.) 60 mL 3 12/18/2023 Bedtime   loratadine  (CLARITIN ) 10 MG tablet Take 1 tablet (10 mg total) by mouth daily. 30 tablet 11 12/18/2023 Morning   methocarbamol  (ROBAXIN ) 500 MG tablet Take 1 tablet (500 mg total) by mouth daily. 90 tablet 1 12/18/2023 Bedtime   metolazone  (ZAROXOLYN ) 5 MG tablet Take 1 tablet (5 mg total) by mouth once a week. (Patient taking differently: Take 5 mg by mouth once a week. mondays) 12 tablet 2 12/14/2023   metoprolol  succinate (TOPROL -XL) 25 MG 24 hr tablet Take 0.5 tablets (12.5 mg total) by mouth daily. 45 tablet 3 12/19/2023 Morning   nystatin  (MYCOSTATIN /NYSTOP ) powder Apply 1 Application topically 3 (three) times daily. 60 g 2 12/19/2023 Morning   Polyethyl Glycol-Propyl Glycol (SYSTANE) 0.4-0.3 % SOLN Place 1 drop into both eyes daily as needed (dry eyes).   12/19/2023 Morning   potassium chloride  SA (KLOR-CON  [name]) 20 MEQ tablet Take 2 tablets (40 mEq total) by mouth daily. 180 tablet 2 12/19/2023 Morning   sertraline  (ZOLOFT ) 50 MG tablet Take 1.5 tablets (75 mg total) by mouth in the morning. 45 tablet 2 12/19/2023 Morning   SYMBICORT  160-4.5 MCG/ACT inhaler Inhale 2 puffs into the lungs daily.   12/19/2023 Morning   tamsulosin  (FLOMAX ) 0.4 MG CAPS capsule Take 2 capsules (0.8 mg total) by mouth daily. 180 capsule 3 12/19/2023 Morning   timolol  (TIMOPTIC ) 0.5 % ophthalmic solution Place 1 drop into both eyes every morning.   12/19/2023 Morning   torsemide  (DEMADEX ) 20 MG tablet Take 2 tablets (40 mg total) by mouth daily. 180 tablet 3 12/19/2023 Morning   [DISCONTINUED] insulin  glargine (LANTUS  SOLOSTAR) 100 UNIT/ML Solostar Pen Inject 45 Units into the skin daily. (Patient taking differently: Inject 45 Units into the skin at bedtime.) 45 mL 3 12/18/2023 Bedtime   Insulin  Pen  Needle (TECHLITE PEN NEEDLES) 32G X 4 MM MISC Use to inject insulin  into the skin 3 times per day. 100 each 0    [DISCONTINUED] hydrALAZINE  (APRESOLINE ) 25 MG tablet Take 0.5 tablets (12.5 mg total) by mouth 3 (three) times daily. (Patient not taking: Reported on 12/19/2023) 90 tablet 6 Not Taking    Assessment: 58y.o. male with PMH of cardiac arrest, afib on Eliquis , T2DM, CKD, and hx of upper extremity thrombus (04/2023), negative for PE on 12/27/23 who presents with increased SOB. HF exacerbation, was about to be discharged. On 7/18 had chest pain, reported to be combination of severe OA and gout. On 7/26, LE edema right leg, new acute DVT noted in right peroneal veins.  Last  dose of Eliquis  7/27 0923, to start Lovenox  BID, first dose on 7/26 2000  BMI: 38.17 kg/m2 CrCl: 46.3 mL/min Wt: 131.2 kg  Goal of Therapy:  Monitor platelets by anticoagulation protocol: Yes   Plan:  Lovenox  130mg  (1mg /kg) BID at 0800 and 2000 Monitor renal function Monitor CBC, signs and symptoms of bleeding  [name] [name] 01/03/2024,4:55 PM

## 2024-01-03 NOTE — Plan of Care (Signed)
 Pt ambulates in hallway , recent Dx of right peroneal DVT.   Pt is concerned about shoulder pain, but seems to be idiopathic pain that comes and goes after resuscitation efforts r/t to previous cardiac arrest.

## 2024-01-03 NOTE — Plan of Care (Signed)
  Problem: Health Behavior/Discharge Planning: Goal: Ability to identify and utilize available resources and services will improve Outcome: Progressing   Problem: Metabolic: Goal: Ability to maintain appropriate glucose levels will improve Outcome: Progressing   

## 2024-01-03 NOTE — Progress Notes (Signed)
 PT stated he does not tend to wear his HOME CPAP. Last worn on 12/20/2023.

## 2024-01-03 NOTE — Progress Notes (Signed)
 VASCULAR LAB    Right lower extremity venous duplex has been performed.  See CV proc for preliminary results.   Tinamarie Przybylski, RVT 01/03/2024, 12:37 PM

## 2024-01-03 NOTE — Progress Notes (Signed)
 Mobility Specialist Progress Note:    01/03/24 1523  Mobility  Activity Ambulated independently in hallway  Level of Assistance Standby assist, set-up cues, supervision of patient - no hands on  Assistive Device None  Distance Ambulated (ft) 200 ft  Activity Response Tolerated well  Mobility Referral Yes  Mobility visit 1 Mobility  Mobility Specialist Start Time (ACUTE ONLY) 1523  Mobility Specialist Stop Time (ACUTE ONLY) 1530  Mobility Specialist Time Calculation (min) (ACUTE ONLY) 7 min   Received pt in hallway coming out of room. Pt discouraged after receiving some news from the doctor. Pt moving about same w/ no c/o any symptoms. Left pt in room w/ all needs met.   Venetia Keel Mobility Specialist Please Neurosurgeon or Rehab Office at [phone]

## 2024-01-04 LAB — BASIC METABOLIC PANEL WITH GFR
Anion gap: 13 (ref 5–15)
BUN: 57 mg/dL — ABNORMAL HIGH (ref 6–20)
CO2: 26 mmol/L (ref 22–32)
Calcium: 9.2 mg/dL (ref 8.9–10.3)
Chloride: 98 mmol/L (ref 98–111)
Creatinine, Ser: 2.68 mg/dL — ABNORMAL HIGH (ref 0.61–1.24)
GFR, Estimated: 27 mL/min — ABNORMAL LOW (ref >60)
Glucose, Bld: 227 mg/dL — ABNORMAL HIGH (ref 70–99)
Potassium: 4.3 mmol/L (ref 3.5–5.1)
Sodium: 137 mmol/L (ref 135–145)

## 2024-01-04 LAB — CBC WITH DIFFERENTIAL/PLATELET
Abs Immature Granulocytes: 0.41 K/uL — ABNORMAL HIGH (ref 0.00–0.07)
Basophils Absolute: 0 K/uL (ref 0.0–0.1)
Basophils Relative: 0 %
Eosinophils Absolute: 0 K/uL (ref 0.0–0.5)
Eosinophils Relative: 0 %
HCT: 42.1 % (ref 39.0–52.0)
Hemoglobin: 13.7 g/dL (ref 13.0–17.0)
Immature Granulocytes: 4 %
Lymphocytes Relative: 14 %
Lymphs Abs: 1.5 K/uL (ref 0.7–4.0)
MCH: 30.2 pg (ref 26.0–34.0)
MCHC: 32.5 g/dL (ref 30.0–36.0)
MCV: 92.9 fL (ref 80.0–100.0)
Monocytes Absolute: 0.3 K/uL (ref 0.1–1.0)
Monocytes Relative: 3 %
Neutro Abs: 8.4 K/uL — ABNORMAL HIGH (ref 1.7–7.7)
Neutrophils Relative %: 79 %
Platelets: 317 K/uL (ref 150–400)
RBC: 4.53 MIL/uL (ref 4.22–5.81)
RDW: 15.1 % (ref 11.5–15.5)
WBC: 10.7 K/uL — ABNORMAL HIGH (ref 4.0–10.5)
nRBC: 0 % (ref 0.0–0.2)

## 2024-01-04 LAB — PROTIME-INR
INR: 1.2 (ref 0.8–1.2)
Prothrombin Time: 16.1 s — ABNORMAL HIGH (ref 11.4–15.2)

## 2024-01-04 LAB — GLUCOSE, CAPILLARY
Glucose-Capillary: 60 mg/dL — ABNORMAL LOW (ref 70–99)
Glucose-Capillary: 138 mg/dL — ABNORMAL HIGH (ref 70–99)
Glucose-Capillary: 235 mg/dL — ABNORMAL HIGH (ref 70–99)
Glucose-Capillary: 195 mg/dL — ABNORMAL HIGH (ref 70–99)
Glucose-Capillary: 70 mg/dL (ref 70–99)

## 2024-01-04 MED ORDER — INSULIN GLARGINE-YFGN 100 UNIT/ML ~~LOC~~ SOLN
35.0000 [IU] | Freq: Two times a day (BID) | SUBCUTANEOUS | Status: DC
  Filled 2024-01-04: qty 0.35

## 2024-01-04 MED ORDER — INSULIN GLARGINE-YFGN 100 UNIT/ML ~~LOC~~ SOLN
40.0000 [IU] | Freq: Every day | SUBCUTANEOUS | Status: DC
  Filled 2024-01-04: qty 0.4

## 2024-01-04 MED ORDER — RIVAROXABAN 20 MG PO TABS: 20.0000 mg | ORAL_TABLET | Freq: Every day | ORAL | Status: DC

## 2024-01-04 MED ORDER — RIVAROXABAN 15 MG PO TABS
15.0000 mg | ORAL_TABLET | Freq: Two times a day (BID) | ORAL | Status: DC
  Administered 2024-01-04 – 2024-01-05 (×2): 15 mg via ORAL
  Filled 2024-01-04 (×2): qty 1

## 2024-01-04 NOTE — Progress Notes (Addendum)
 [name]   DOB:1966-03-01   FM#:[id number]      ASSESSMENT & PLAN:  [name]. [name] is a 58 year old male patient admitted 12/19/2023 with complaints of chest pain.  He follows with hematology/Dr. Loretha for acute DVT.  Acute DVT, RLE-newly diagnosed History of acute DVT, LUE --resolved - Patient seen in January 2025 with LUE DVT.  First episode previous November possibly provoked by IV access. -- Doppler studies 7/22 shows no evidence of DVT in UE.  - Doppler studies done 01/03/2024 shows thrombus of right peroneal. - Patient has been taking Eliquis .  Reports that he has been compliant with taking it twice a day. - Recommend Lovenox  at this time as ordered.  May transition to Xarelto . - Recommend close hematology outpatient follow-up with Dr. Loretha.  Dr. Donella Pascarella following for now.   Left shoulder pain Osteoarthritis -- Sling intact on LUE -- Reviewed dopplers, DVT resolved -- Continue pain management  CKD - Elevated creatinine 2.68 with BUN 57 - Avoid nephrotoxic agents -- Continue current management  Hypertension Diabetes - Continue to monitor blood pressure closely - Avoid nephrotoxic agents - Monitor blood sugar levels. - Administer insulin  per coverage protocol    Code Status DNR-Limited  Subjective:  Patient seen awake and alert sitting up in bed.  Patient has left upper extremity sling intact.  Reports left shoulder and neck area pain.  Also states shortness of breath when ambulates in the hallway.  Patient reports compliance with Eliquis  twice a day as ordered.  No other acute complaints offered.    Objective:   Intake/Output Summary (Last 24 hours) at 01/04/2024 1328 Last data filed at 01/04/2024 1200 Gross per 24 hour  Intake 960 ml  Output 3425 ml  Net -2465 ml     PHYSICAL EXAMINATION: ECOG PERFORMANCE STATUS: 3 - Symptomatic, >50% confined to bed  Vitals:   01/04/24 0904 01/04/24 1148  BP:  123/75  Pulse:  64  Resp:  18  Temp:  98.6 F (37 C)   SpO2: 98% 92%   Filed Weights   01/02/24 0537 01/03/24 0537 01/04/24 0537  Weight: 285 lb 7.9 oz (129.5 kg) 289 lb 3.9 oz (131.2 kg) 288 lb 2.3 oz (130.7 kg)    GENERAL: alert, no distress and comfortable +obese SKIN: skin color, texture, turgor are normal, no rashes or significant lesions EYES: normal, conjunctiva are pink and non-injected, sclera clear OROPHARYNX: no exudate, no erythema and lips, buccal mucosa, and tongue normal  NECK: supple, thyroid  normal size, non-tender, without nodularity LYMPH: no palpable lymphadenopathy in the cervical, axillary or inguinal LUNGS: +clear to auscultation  HEART: regular rate & rhythm and no murmurs and no lower extremity edema ABDOMEN: abdomen soft, non-tender and normal bowel sounds MUSCULOSKELETAL: +Pain LUE PSYCH: alert & oriented x 3 with fluent speech NEURO: no focal motor/sensory deficits   All questions were answered. The patient knows to call the clinic with any problems, questions or concerns.   The total time spent in the appointment was 40 minutes encounter with patient including review of chart and various tests results, discussions about plan of care and coordination of care plan  Olam JINNY Brunner, NP 01/04/2024 1:28 PM    Labs Reviewed:  Lab Results  Component Value Date   WBC 10.7 (H) 01/04/2024   HGB 13.7 01/04/2024   HCT 42.1 01/04/2024   MCV 92.9 01/04/2024   PLT 317 01/04/2024   Recent Labs    10/08/23 1152 10/09/23 1055 10/23/23 1105 11/12/23 1535 12/23/23  9772 12/24/23 0230 12/27/23 9148 12/28/23 0302 01/01/24 0237 01/03/24 0228 01/04/24 1157  NA 131*   < > 141   < > 138   < > 136   < > 140 142 137  K 2.8*   < > 3.5   < > 3.4*   < > 3.7   < > 3.7 3.4* 4.3  CL 88*   < > 106   < > 96*   < > 97*   < > 101 102 98  CO2 26   < > 22   < > 28   < > 25   < > 27 27 26   GLUCOSE 279*   < > 155*   < > 249*   < > 184*   < > 112* 75 227*  BUN 83*   < > 15   < > 65*   < > 54*   < > 58* 58* 57*  CREATININE 4.58*    < > 2.27*   < > 2.66*   < > 2.69*   < > 2.31* 2.44* 2.68*  CALCIUM  9.1   < > 9.3   < > 9.9   < > 9.2   < > 9.0 9.0 9.2  GFRNONAA 14*   < > 32*   < > 27*   < > 26*   < > 32* 30* 27*  PROT 7.4  --  7.1  --   --   --  8.1  --   --   --   --   ALBUMIN  3.3*   < > 3.6  --  3.7  --  3.9  --   --   --   --   AST 13*  --  21  --   --   --  37  --   --   --   --   ALT 11  --  22  --   --   --  47*  --   --   --   --   ALKPHOS 53  --  51  --   --   --  81  --   --   --   --   BILITOT 0.9  --  0.6  --   --   --  0.7  --   --   --   --   BILIDIR <0.1  --   --   --   --   --   --   --   --   --   --   IBILI NOT CALCULATED  --   --   --   --   --   --   --   --   --   --    < > = values in this interval not displayed.    Studies Reviewed:  VAS US  LOWER EXTREMITY VENOUS (DVT) Result Date: 01/03/2024  Lower Venous DVT Study Patient Name:  [name]  Date of Exam:   01/03/2024 Medical Rec #: [id number]       Accession #:    [id number] Date of Birth: [birth year]       Patient Gender: M Patient Age:   38 years Exam Location:  [facility] Procedure:      VAS US  LOWER EXTREMITY VENOUS (DVT) Referring Phys: JEFFREY WALDEN --------------------------------------------------------------------------------  Indications: Edema.  Risk Factors: Immobility-has been inpatient for 16 days. Anticoagulation: Eliquis . Comparison Study: No prior study  on file Performing Technologist: Alberta Lis RVS  Examination Guidelines: A complete evaluation includes B-mode imaging, spectral Doppler, color Doppler, and power Doppler as needed of all accessible portions of each vessel. Bilateral testing is considered an integral part of a complete examination. Limited examinations for reoccurring indications may be performed as noted. The reflux portion of the exam is performed with the patient in reverse Trendelenburg.  +---------+---------------+---------+-----------+----------+--------------+ RIGHT     CompressibilityPhasicitySpontaneityPropertiesThrombus Aging +---------+---------------+---------+-----------+----------+--------------+ CFV      Full           Yes      Yes                                 +---------+---------------+---------+-----------+----------+--------------+ SFJ      Full                                                        +---------+---------------+---------+-----------+----------+--------------+ FV Prox  Full           Yes      Yes                                 +---------+---------------+---------+-----------+----------+--------------+ FV Mid   Full                                                        +---------+---------------+---------+-----------+----------+--------------+ FV DistalFull           Yes      Yes                                 +---------+---------------+---------+-----------+----------+--------------+ PFV      Full                                                        +---------+---------------+---------+-----------+----------+--------------+ POP      Full           Yes      Yes                                 +---------+---------------+---------+-----------+----------+--------------+ PTV      Full                                                        +---------+---------------+---------+-----------+----------+--------------+ PERO     None           No       No                   Acute          +---------+---------------+---------+-----------+----------+--------------+  Gastroc  Full                                                        +---------+---------------+---------+-----------+----------+--------------+   +----+---------------+---------+-----------+----------+--------------+ LEFTCompressibilityPhasicitySpontaneityPropertiesThrombus Aging +----+---------------+---------+-----------+----------+--------------+ CFV Full           Yes      Yes                                  +----+---------------+---------+-----------+----------+--------------+ SFJ Full                                                        +----+---------------+---------+-----------+----------+--------------+    Summary: RIGHT: - Findings consistent with acute deep vein thrombosis involving the right peroneal veins.  - A cystic structure is found in the popliteal fossa.  LEFT: - No evidence of common femoral vein obstruction.   *See table(s) above for measurements and observations.    Preliminary    VAS US  UPPER EXTREMITY VENOUS DUPLEX Result Date: 12/29/2023 UPPER VENOUS STUDY  Patient Name:  ELENA COTHERN  Date of Exam:   12/29/2023 Medical Rec #: [id number]       Accession #:    [id number] Date of Birth: 09/13/1965       Patient Gender: M Patient Age:   89 years Exam Location:  [facility] Procedure:      VAS US  UPPER EXTREMITY VENOUS DUPLEX Referring Phys: SIGURD PAC --------------------------------------------------------------------------------  Indications: Edema Comparison Study: 11/2023 left cephalic vein SVT Performing Technologist: Elmarie Lindau, RVT  Examination Guidelines: A complete evaluation includes B-mode imaging, spectral Doppler, color Doppler, and power Doppler as needed of all accessible portions of each vessel. Bilateral testing is considered an integral part of a complete examination. Limited examinations for reoccurring indications may be performed as noted.  Right Findings: +----------+------------+---------+-----------+----------+---------------------+ RIGHT     CompressiblePhasicitySpontaneousProperties       Summary        +----------+------------+---------+-----------+----------+---------------------+ Subclavian                                           normal coaptation,                                                          color flow, and                                                              waveform         +----------+------------+---------+-----------+----------+---------------------+  Left Findings: +----------+------------+---------+-----------+----------+---------------+ LEFT      CompressiblePhasicitySpontaneousProperties    Summary     +----------+------------+---------+-----------+----------+---------------+ IJV  Full                                  thickened walls +----------+------------+---------+-----------+----------+---------------+ Subclavian    Full                                                  +----------+------------+---------+-----------+----------+---------------+ Axillary      Full                                                  +----------+------------+---------+-----------+----------+---------------+ Brachial      Full                                                  +----------+------------+---------+-----------+----------+---------------+ Radial        Full                                                  +----------+------------+---------+-----------+----------+---------------+ Ulnar         Full                                                  +----------+------------+---------+-----------+----------+---------------+ Cephalic      Full                                  thickened walls +----------+------------+---------+-----------+----------+---------------+ Basilic       Full                                                  +----------+------------+---------+-----------+----------+---------------+  Summary:  Right: No evidence of thrombosis in the subclavian.  Left: No evidence of deep vein thrombosis in the upper extremity. There has been some resolution of the left cephalic vein SVT when compared to the prior exam.  *See table(s) above for measurements and observations.  Diagnosing physician: Norman Serve Electronically signed by Norman Serve on 12/29/2023 at 3:27:25 PM.    Final    CT SHOULDER LEFT WO  CONTRAST Result Date: 12/29/2023 CLINICAL DATA:  Chronic shoulder pain. EXAM: CT OF THE UPPER LEFT EXTREMITY WITHOUT CONTRAST TECHNIQUE: Multidetector CT imaging of the upper left extremity was performed according to the standard protocol. RADIATION DOSE REDUCTION: This exam was performed according to the departmental dose-optimization program which includes automated exposure control, adjustment of the mA and/or kV according to patient size and/or use of iterative reconstruction technique. COMPARISON:  Left humerus radiographs dated 07/28/2013. FINDINGS: Bones/Joint/Cartilage No acute fracture or dislocation. Partially visualized lateral plate and screw fixation of a remote mid humeral diaphyseal fracture. Severe glenohumeral  osteoarthritis manifested by marked joint space narrowing, bulky marginal humeral osteophytosis, and subchondral sclerosis. Mesoacromial os acromiale with moderate osteoarthritis of the acromioclavicular joint manifested by joint space narrowing and osteophytosis. Ligaments Ligaments are suboptimally evaluated by CT. Muscles and Tendons Muscles are normal. No muscle atrophy. No intramuscular fluid collection or hematoma. Soft tissue No fluid collection or hematoma. No soft tissue mass. Partially visualized left subclavian dual lead pacemaker. IMPRESSION: 1. Severe glenohumeral osteoarthritis. 2. Moderate acromioclavicular osteoarthritis with os acromiale. 3. Partially visualized lateral plate and screw fixation of a remote mid humeral diaphyseal fracture. Electronically Signed   By: Harrietta Sherry M.D.   On: 12/29/2023 14:54   NM Pulmonary Perfusion Result Date: 12/27/2023 CLINICAL DATA:  Chest pain. EXAM: NUCLEAR MEDICINE PERFUSION LUNG SCAN TECHNIQUE: Perfusion images were obtained in multiple projections after intravenous injection of radiopharmaceutical. Ventilation scans intentionally deferred if perfusion scan and chest x-ray adequate for interpretation during COVID 19 epidemic.  RADIOPHARMACEUTICALS:  4.4 mCi Tc-39m MAA IV COMPARISON:  Chest radiograph, 12/25/2023.  Chest CT, 12/27/2023. FINDINGS: There are no areas of abnormal perfusion. Specifically, there are no segmental defects to suggest pulmonary thromboembolism. IMPRESSION: Normal exam. Electronically Signed   By: Alm Parkins M.D.   On: 12/27/2023 15:43   CT CHEST WO CONTRAST Result Date: 12/27/2023 CLINICAL DATA:  Nonspecific chest pain EXAM: CT CHEST WITHOUT CONTRAST TECHNIQUE: Multidetector CT imaging of the chest was performed following the standard protocol without IV contrast. RADIATION DOSE REDUCTION: This exam was performed according to the departmental dose-optimization program which includes automated exposure control, adjustment of the mA and/or kV according to patient size and/or use of iterative reconstruction technique. COMPARISON:  None Available. FINDINGS: Cardiovascular: Left subclavian dual lead pacemaker in place with leads within the right atrium and right ventricle. No significant coronary artery calcification. Global cardiac size ismildly enlarged. No pericardial effusion. Central pulmonary arteries are of normal caliber. No significant atherosclerotic calcification within the thoracic aorta. No aortic aneurysm. Mediastinum/Nodes: No enlarged mediastinal or axillary lymph nodes. Thyroid  gland, trachea, and esophagus demonstrate no significant findings. Tiny hiatal hernia Lungs/Pleura: Lungs are clear. No pleural effusion or pneumothorax. Upper Abdomen: 15 mm right and 20 mm left adrenal nodules are again identified and are stable since remote prior examination of 09/17/2017, safely considered benign and likely representing benign adrenal adenoma given their stability over time. No acute abnormality. Musculoskeletal: No chest wall mass or suspicious bone lesions identified. IMPRESSION: 1. No acute intrathoracic findings. 2. Mild cardiomegaly. 3. Tiny hiatal hernia. 4. Bilateral adrenal nodules, stable since  remote prior examination of 09/17/2017, safely considered benign and likely representing benign adrenal adenomas given their stability over time. Electronically Signed   By: Dorethia Molt M.D.   On: 12/27/2023 10:23   DG CHEST PORT 1 VIEW Result Date: 12/25/2023 CLINICAL DATA:  Chest pain EXAM: PORTABLE CHEST 1 VIEW COMPARISON:  X-ray 12/19/2023. FINDINGS: Underinflation. Enlarged cardiopericardial silhouette. Tortuous ectatic aorta. No pneumothorax or effusion. No edema. Bronchovascular crowding. Left upper chest battery pack with defibrillator leads along the right side of the heart. Overlapping cardiac leads. Film is under penetrated. IMPRESSION: Underinflation. Enlarged cardiac silhouette with some prominence of central vasculature. No edema. Defibrillator. Electronically Signed   By: Ranell Bring M.D.   On: 12/25/2023 15:34   ECHOCARDIOGRAM COMPLETE Result Date: 12/22/2023    ECHOCARDIOGRAM REPORT   Patient Name:   ASEEM SESSUMS Date of Exam: 12/22/2023 Medical Rec #:  [id number]      Height:  73.0 in Accession #:    [id number]     Weight:       288.4 lb Date of Birth:  Mar 16, 1966      BSA:          2.514 m Patient Age:    58 years       BP:           116/57 mmHg Patient Gender: M              HR:           66 bpm. Exam Location:  Inpatient Procedure: 2D Echo, Cardiac Doppler, Color Doppler and Intracardiac            Opacification Agent (Both Spectral and Color Flow Doppler were            utilized during procedure). Indications:    CHF  History:        Patient has prior history of Echocardiogram examinations, most                 recent 09/03/2023. Defibrillator, COPD; Risk Factors:Diabetes,                 Hypertension and Dyslipidemia.  Sonographer:    Philomena Daring Referring Phys: [id number] LONNI LITTIE NANAS  Sonographer Comments: Patient is obese. IMPRESSIONS  1. Left ventricular ejection fraction, by estimation, is 40 to 45%. The left ventricle has mildly decreased function. The left  ventricle demonstrates global hypokinesis. The left ventricular internal cavity size was mildly dilated. There is moderate concentric left ventricular hypertrophy. Left ventricular diastolic parameters are consistent with Grade I diastolic dysfunction (impaired relaxation).  2. Right ventricular systolic function is normal. The right ventricular size is normal. Tricuspid regurgitation signal is inadequate for assessing PA pressure.  3. Left atrial size was mildly dilated.  4. The mitral valve is normal in structure. No evidence of mitral valve regurgitation.  5. The aortic valve is tricuspid. Aortic valve regurgitation is not visualized. No aortic stenosis is present.  6. The inferior vena cava is normal in size with greater than 50% respiratory variability, suggesting right atrial pressure of 3 mmHg. Comparison(s): No significant change from prior study. Prior images reviewed side by side. FINDINGS  Left Ventricle: Left ventricular ejection fraction, by estimation, is 40 to 45%. The left ventricle has mildly decreased function. The left ventricle demonstrates global hypokinesis. The left ventricular internal cavity size was mildly dilated. There is  moderate concentric left ventricular hypertrophy. Left ventricular diastolic parameters are consistent with Grade I diastolic dysfunction (impaired relaxation). Right Ventricle: The right ventricular size is normal. Right vetricular wall thickness was not well visualized. Right ventricular systolic function is normal. Tricuspid regurgitation signal is inadequate for assessing PA pressure. Left Atrium: Left atrial size was mildly dilated. Right Atrium: Right atrial size was not well visualized. Pericardium: There is no evidence of pericardial effusion. Mitral Valve: The mitral valve is normal in structure. No evidence of mitral valve regurgitation. Tricuspid Valve: The tricuspid valve is not well visualized. Tricuspid valve regurgitation is not demonstrated. Aortic  Valve: The aortic valve is tricuspid. Aortic valve regurgitation is not visualized. No aortic stenosis is present. Pulmonic Valve: The pulmonic valve was grossly normal. Pulmonic valve regurgitation is mild. No evidence of pulmonic stenosis. Aorta: The aortic root and ascending aorta are structurally normal, with no evidence of dilitation. Venous: The inferior vena cava is normal in size with greater than 50% respiratory variability, suggesting right atrial pressure of 3 mmHg.  IAS/Shunts: The interatrial septum was not well visualized. Additional Comments: A device lead is visualized.  LEFT VENTRICLE PLAX 2D LVIDd:         5.90 cm   Diastology LVIDs:         4.20 cm   LV e' medial:   3.81 cm/s LV PW:         1.30 cm   LV E/e' medial: 14.3 LV IVS:        1.50 cm LVOT diam:     2.60 cm LV SV:         99 LV SV Index:   39 LVOT Area:     5.31 cm  IVC IVC diam: 1.60 cm LEFT ATRIUM             Index        RIGHT ATRIUM           Index LA diam:        3.60 cm 1.43 cm/m   RA Area:     14.80 cm LA Vol (A2C):   41.7 ml 16.59 ml/m  RA Volume:   39.00 ml  15.52 ml/m LA Vol (A4C):   41.6 ml 16.55 ml/m LA Biplane Vol: 42.3 ml 16.83 ml/m  AORTIC VALVE LVOT Vmax:   86.50 cm/s LVOT Vmean:  58.400 cm/s LVOT VTI:    0.186 m  AORTA Ao Root diam: 3.70 cm Ao Asc diam:  3.40 cm MITRAL VALVE MV Area (PHT): 3.37 cm    SHUNTS MV Decel Time: 225 msec    Systemic VTI:  0.19 m MV E velocity: 54.50 cm/s  Systemic Diam: 2.60 cm MV A velocity: 97.90 cm/s MV E/A ratio:  0.56 Mihai Croitoru MD Electronically signed by Jerel Balding MD Signature Date/Time: 12/22/2023/4:07:17 PM    Final    US  EKG SITE RITE Result Date: 12/20/2023 If Site Rite image not attached, placement could not be confirmed due to current cardiac rhythm.  DG Chest Port 1 View Result Date: 12/19/2023 CLINICAL DATA:  Chest pain EXAM: PORTABLE CHEST 1 VIEW COMPARISON:  Chest x-ray 10/10/2023 FINDINGS: The cardiac silhouette is enlarged, unchanged. Left lung base has  been excluded from the exam. Left-sided pacemaker is unchanged in position. The lungs are clear. There is no pneumothorax or acute fracture. IMPRESSION: 1. No active disease. 2. Cardiomegaly. Electronically Signed   By: Greig Pique M.D.   On: 12/19/2023 18:44   Attending Note  I personally saw the patient, reviewed the chart and examined the patient. The plan of care was discussed with the patient and the admitting team. I agree with the assessment and plan as documented above. Thank you very much for the consultation. Recurrent DVT: Right peroneal vein thrombus: Currently on Lovenox .  I recommended switching the patient to Xarelto  at the time of discharge.  If the patient has a recurrent blood clot on Xarelto  then the only option would be Lovenox  or Coumadin.  Patient was very compliant with his Eliquis  previously.  Therefore we have to consider this as a failure of Eliquis . Outpatient follow-up with Dr. Loretha in 1 month

## 2024-01-04 NOTE — Plan of Care (Signed)
   Problem: Clinical Measurements: Goal: Will remain free from infection Outcome: Progressing Goal: Respiratory complications will improve Outcome: Progressing

## 2024-01-04 NOTE — Progress Notes (Signed)
 PROGRESS NOTE  [name]  FMW:[id number] DOB: May 30, 1966 DOA: 12/19/2023 PCP: Vicci Barnie NOVAK, MD  Consultants  Brief Narrative: 58/M w cardiac arrest status post AICD placement, NICM, atrial fibrillation on anticoagulation, T2DM, stage IIIb CKD  who presented to the ED on 12/19/2023 with worsening shortness of breath over the last couple of days.  Work up in the ED included nonacute CXR, admitted with CHF exacerbation.  Was moving towards discharge.    However, on 7/18, discharge completed,, cards signed off,  but pt subsequently reported left-sided chest pain.  X-ray and troponins unremarkable - 7/19 AM, VQ scan ordered, but CP continued, had neg non con CT.  - 7/21, CRP elevated, started on IV steroids for question of pleurisy.  Pulm consulted> suspected to be musculoskeletal -7/22: Pain extending to left shoulder and upper back, limited range of motion left shoulder> CT with severe glenohumeral osteoarthritis.  Ortho believes combination of severe OA plus possible gout contributing to pain. 7/26:  shoulder pain improving, but now with new LE edema Right leg 7/27:  shoulder pain has returned again today.  Acute DVT noted in Right peroneal veins. 7/28: Switched to Xarelto  on recommendation of oncology to treat new onset DVT.  Assessment & Plan: Acute on chronic systolic CHF -Reason for admission, much improved.  Repeat echo with EF 40-45%, moderate LVH, grade 1 DD, normal RV - Improved with diuresis, 14 L negative - Now euvolemic, transitioned to oral torsemide , continue Toprol  -GDMT limited by CKD Stage III/IV   Acute DVT:  - New, acute DVT Left leg.  Reported new onset Left leg swelling yesterday.  US  revealed same today.  Failure treatment of Eliquis  - On treatment dose Lovenox . - Discussed with Dr. Gudenda from Heme/Onc, plan will be to switch to Xarelto  rather than continue outpatient Lovenox .  Left lateral chest wall pain Now severe left shoulder pain, limited ROM - Main  complaint that is To her in the hospital this past week.  No rash noted on chest, Left sided chest/shoulder pain - New onset since 7/18, w/u thus far has been unremarkable, minus Left arm SVT noted on US  persistent from Nov 2024.  Echo w/o pericardial effusion,  -2D echo last week with stable cardiomyopathy, no pericardial effusion, No ST elevation consistent with pericarditis. - Pulmonology consulted, no new recommendations -Recommend to treat musculoskeletal pain; cannot give NSAIDs due to CKD stage IIIB - Fairly stable now.  Hypoglycemic episode:  - Report again last night.  CBG dropped to 60. - This is very low for him. - I have stopped his evening dose of Semglee  to prevent further hypoglycemia and increased his morning dose for better daytime coverage.  Gout - Orthopedics consulted and reports that left shoulder pain likely combination of OA/gout. - Gout is a possibility, uric acid elevated, continue Solu-Medrol  day 3 now -Solu-Medrol  changed to 40 mg IV daily-->switching to prednisone  50 mg 7/25.  Stop date of 7/30  Leukocytosis:  - Present fairly consistently since April of this year - recommend repeat outpt to ensure resolution    Esophageal dysphagia - History of aspiration like episode prior to admission - Continue PPI, refer to GI outpatient - continue carafate  -Doing well thus far with this.   Paroxysmal atrial fibrillation - Currently in sinus rhythm, continue amiodarone , Eliquis    History of VT Continue amiodarone    OSA: intolerant even of new CPAP machine at home  - CPAP nightly   AKI on stage IIIb CKD: SCr on admission is 3.2 from  baseline 2.7 - Improved and stable now at baseline of 2.5-2.7   IDT2DM with steroid induced hyperglycemia:  - Continue Semglee  - FU with endocrinology, Dr. Trixie.    HLD:  - Continue statin   Depression:  - Continue SSRI   BPH:  - Continue tamsulosin     DVT prophylaxis:  Xarelto   Code Status:   Code Status: Limited:  Do not attempt resuscitation (DNR) -DNR-LIMITED -Do Not Intubate/DNI  Level of care: Progressive Status is: Inpatient  Consults called: pulmonology, cardiology, orthopedics  Subjective: Pain better today but he did have hypoglycemic event this morning to 60.  Very shaky and sweaty during.  Objective: Vitals:   01/04/24 0537 01/04/24 0806 01/04/24 0904 01/04/24 1148  BP: 110/72 122/76  123/75  Pulse: 64 66  64  Resp: 18 18  18   Temp: 97.8 F (36.6 C) 97.6 F (36.4 C)  98.6 F (37 C)  TempSrc: Oral Oral  Oral  SpO2: 94% 95% 98% 92%  Weight: 130.7 kg     Height:        Intake/Output Summary (Last 24 hours) at 01/04/2024 1539 Last data filed at 01/04/2024 1200 Gross per 24 hour  Intake 960 ml  Output 3175 ml  Net -2215 ml   Filed Weights   01/02/24 0537 01/03/24 0537 01/04/24 0537  Weight: 129.5 kg 131.2 kg 130.7 kg   Body mass index is 38.02 kg/m.  Gen: 58 y.o. male seated on side of bed, awake, alert, complaining of pain Pulm: Non-labored breathing.  Clear to auscultation bilaterally.  CV: Regular rate and rhythm.  GI: Abdomen soft, non-tender, non-distended, with normoactive bowel sounds. No organomegaly or masses felt. Ext: Warm, no deformities, left arm in sling.  Able to move shoulder and more ranges of motion with sling than he was yesterday.  Left lower extremity with trace calf/pedal edema.  Patient with +1 calf/pedal edema on the right. Skin: No rashes, lesions  Neuro: Alert and oriented. No focal neurological deficits. Psych: Calm  Judgement and insight appear normal. Mood & affect appropriate.     I have personally reviewed the following labs and images: CBC: Recent Labs  Lab 12/30/23 0228 12/31/23 0231 01/01/24 0237 01/03/24 0228 01/04/24 1157  WBC 14.3* 12.4* 11.9* 14.2* 10.7*  NEUTROABS  --   --   --   --  8.4*  HGB 11.8* 12.2* 13.8 12.6* 13.7  HCT 35.4* 37.3* 41.7 38.8* 42.1  MCV 93.2 93.0 93.1 92.6 92.9  PLT 268 290 283 311 317   BMP  &GFR Recent Labs  Lab 12/30/23 0228 12/31/23 0231 01/01/24 0237 01/03/24 0228 01/04/24 1157  NA 135 138 140 142 137  K 4.2 4.4 3.7 3.4* 4.3  CL 99 103 101 102 98  CO2 24 26 27 27 26   GLUCOSE 265* 226* 112* 75 227*  BUN 55* 60* 58* 58* 57*  CREATININE 2.63* 2.46* 2.31* 2.44* 2.68*  CALCIUM  8.5* 8.8* 9.0 9.0 9.2   Estimated Creatinine Clearance: 42.1 mL/min (A) (by C-G formula based on SCr of 2.68 mg/dL (H)). Liver & Pancreas: No results for input(s): AST, ALT, ALKPHOS, BILITOT, PROT, ALBUMIN  in the last 168 hours.  No results for input(s): LIPASE, AMYLASE in the last 168 hours. No results for input(s): AMMONIA in the last 168 hours. Diabetic: No results for input(s): HGBA1C in the last 72 hours. Recent Labs  Lab 01/03/24 1622 01/03/24 2112 01/04/24 0522 01/04/24 0556 01/04/24 1146  GLUCAP 117* 118* 60* 138* 235*   Cardiac Enzymes: No  results for input(s): CKTOTAL, CKMB, CKMBINDEX, TROPONINI in the last 168 hours. No results for input(s): PROBNP in the last 8760 hours. Coagulation Profile: Recent Labs  Lab 01/04/24 1157  INR 1.2   Thyroid  Function Tests: No results for input(s): TSH, T4TOTAL, FREET4, T3FREE, THYROIDAB in the last 72 hours. Lipid Profile: No results for input(s): CHOL, HDL, LDLCALC, TRIG, CHOLHDL, LDLDIRECT in the last 72 hours. Anemia Panel: No results for input(s): VITAMINB12, FOLATE, FERRITIN, TIBC, IRON, RETICCTPCT in the last 72 hours. Urine analysis:    Component Value Date/Time   COLORURINE STRAW (A) 10/08/2023 0952   APPEARANCEUR CLEAR 10/08/2023 0952   APPEARANCEUR Clear 07/17/2020 1655   LABSPEC 1.009 10/08/2023 0952   PHURINE 5.0 10/08/2023 0952   GLUCOSEU 50 (A) 10/08/2023 0952   HGBUR NEGATIVE 10/08/2023 0952   BILIRUBINUR NEGATIVE 10/08/2023 0952   BILIRUBINUR negative 01/30/2022 1705   BILIRUBINUR Negative 07/17/2020 1655   KETONESUR NEGATIVE 10/08/2023 0952    PROTEINUR NEGATIVE 10/08/2023 0952   UROBILINOGEN 0.2 01/30/2022 1705   UROBILINOGEN 1.0 06/20/2013 1032   NITRITE NEGATIVE 10/08/2023 0952   LEUKOCYTESUR NEGATIVE 10/08/2023 0952   Sepsis Labs: Invalid input(s): PROCALCITONIN, LACTICIDVEN  Microbiology: No results found for this or any previous visit (from the past 240 hours).  Radiology Studies: No results found.   Scheduled Meds:  amiodarone   400 mg Oral Daily   arformoterol   15 mcg Nebulization BID   atorvastatin   10 mg Oral Daily   budesonide  (PULMICORT ) nebulizer solution  0.5 mg Nebulization BID   clonazePAM   0.5 mg Oral BID   colchicine   0.3 mg Oral Q M,W,F   enoxaparin  (LOVENOX ) injection  130 mg Subcutaneous Q12H   famotidine   20 mg Oral QHS   gabapentin   300 mg Oral QHS   insulin  aspart  0-20 Units Subcutaneous TID WC   insulin  aspart  0-5 Units Subcutaneous QHS   insulin  aspart  8 Units Subcutaneous TID WC   insulin  glargine-yfgn  35 Units Subcutaneous BID   methocarbamol   500 mg Oral BID   metoprolol  succinate  12.5 mg Oral Daily   pantoprazole   40 mg Oral BID   polyethylene glycol  17 g Oral Daily   potassium chloride  SA  40 mEq Oral Daily   predniSONE   50 mg Oral Q breakfast   revefenacin   175 mcg Nebulization Daily   sertraline   75 mg Oral q AM   sodium chloride  flush  3 mL Intravenous Q12H   sucralfate   1 g Oral TID WC & HS   tamsulosin   0.8 mg Oral Daily   timolol   1 drop Both Eyes q morning   torsemide   40 mg Oral Daily   Continuous Infusions:   LOS: 16 days   35 minutes with more than 50% spent in reviewing records, counseling patient/family and coordinating care.  Reyes VEAR Gaw, MD [facility] [email or web address] 01/04/2024, 3:39 PM

## 2024-01-04 NOTE — Plan of Care (Signed)
 Patient did well today.  Ambulated in room. Expected to discharge tomorrow.

## 2024-01-04 NOTE — Progress Notes (Signed)
 PT refused to wear his HOME CPAP. Last worn on 12/20/2023.

## 2024-01-05 ENCOUNTER — Encounter (HOSPITAL_COMMUNITY)

## 2024-01-05 ENCOUNTER — Other Ambulatory Visit (HOSPITAL_COMMUNITY): Payer: Self-pay

## 2024-01-05 ENCOUNTER — Encounter

## 2024-01-05 ENCOUNTER — Telehealth (HOSPITAL_COMMUNITY): Payer: Self-pay | Admitting: Pharmacy Technician

## 2024-01-05 DIAGNOSIS — J441 Chronic obstructive pulmonary disease with (acute) exacerbation: Secondary | ICD-10-CM | POA: Diagnosis not present

## 2024-01-05 LAB — GLUCOSE, CAPILLARY
Glucose-Capillary: 57 mg/dL — ABNORMAL LOW (ref 70–99)
Glucose-Capillary: 78 mg/dL (ref 70–99)
Glucose-Capillary: 184 mg/dL — ABNORMAL HIGH (ref 70–99)
Glucose-Capillary: 195 mg/dL — ABNORMAL HIGH (ref 70–99)

## 2024-01-05 MED ORDER — OXYCODONE HCL 5 MG PO TABS: 5.0000 mg | ORAL_TABLET | Freq: Four times a day (QID) | ORAL | 0 refills | Status: DC | PRN

## 2024-01-05 MED ORDER — RIVAROXABAN (XARELTO) VTE STARTER PACK (15 & 20 MG)
ORAL_TABLET | ORAL | 0 refills | Status: DC
Start: 2024-01-05 — End: 2024-02-05
  Filled 2024-01-05: qty 51, 30d supply, fill #0

## 2024-01-05 MED ORDER — LANTUS SOLOSTAR 100 UNIT/ML ~~LOC~~ SOPN: 30.0000 [IU] | PEN_INJECTOR | Freq: Every day | SUBCUTANEOUS | Status: DC

## 2024-01-05 MED ORDER — PANTOPRAZOLE SODIUM 40 MG PO TBEC
40.0000 mg | DELAYED_RELEASE_TABLET | Freq: Every day | ORAL | 0 refills | Status: DC
  Filled 2024-01-05: qty 30, 30d supply, fill #0

## 2024-01-05 MED ORDER — RIVAROXABAN 20 MG PO TABS: 20.0000 mg | ORAL_TABLET | Freq: Every day | ORAL | 2 refills | Status: DC

## 2024-01-05 MED ORDER — RIVAROXABAN 2.5 MG PO TABS: ORAL_TABLET | ORAL | 0 refills | Status: DC

## 2024-01-05 MED ORDER — OXYCODONE HCL 5 MG PO TABS: 5.0000 mg | ORAL_TABLET | Freq: Four times a day (QID) | ORAL | 0 refills | Status: AC | PRN

## 2024-01-05 NOTE — TOC Transition Note (Addendum)
 Transition of Care [facility]) - Discharge Note   Patient Details  Name: [name] MRN: [id number] Date of Birth: August 07, 1965  [facility] CM/SW Contact:  Waddell Barnie Rama, RN Phone Number: 01/05/2024, 1:24 PM   Clinical Narrative:    For dc today, has no needs.  Will be on xarelto , copay is 4.00.  NCM gave him 30 day free coupon, trial.     Barriers to Discharge: Continued Medical Work up   Patient Goals and CMS Choice Patient states their goals for this hospitalization and ongoing recovery are:: return home   Choice offered to / list presented to : NA      Discharge Placement                       Discharge Plan and Services Additional resources added to the After Visit Summary for   In-house Referral: NA Discharge Planning Services: CM Consult Post Acute Care Choice: NA          DME Arranged: N/A DME Agency: NA       HH Arranged: NA          Social Drivers of Health (SDOH) Interventions SDOH Screenings   Food Insecurity: No Food Insecurity (12/20/2023)  Housing: Low Risk  (12/20/2023)  Transportation Needs: No Transportation Needs (12/20/2023)  Utilities: Not At Risk (12/20/2023)  Alcohol Screen: Low Risk  (05/25/2023)  Depression (PHQ2-9): Low Risk  (08/07/2023)  Recent Concern: Depression (PHQ2-9) - Medium Risk (05/25/2023)  Financial Resource Strain: Low Risk  (05/25/2023)  Recent Concern: Financial Resource Strain - High Risk (03/24/2023)  Physical Activity: Inactive (05/25/2023)  Social Connections: Moderately Integrated (10/08/2023)  Stress: Stress Concern Present (05/25/2023)  Tobacco Use: Medium Risk (12/20/2023)  Health Literacy: Adequate Health Literacy (05/25/2023)     Readmission Risk Interventions    12/21/2023    4:08 PM  Readmission Risk Prevention Plan  Transportation Screening Complete  Medication Review (RN Care Manager) Complete  PCP or Specialist appointment within 3-5 days of discharge Complete  HRI or Home  Care Consult Complete  Palliative Care Screening Not Applicable  Skilled Nursing Facility Not Applicable

## 2024-01-05 NOTE — Discharge Instructions (Signed)
 Information on my medicine - XARELTO  (rivaroxaban )  WHY WAS XARELTO  PRESCRIBED FOR YOU? Xarelto  was prescribed to treat blood clots that may have been found in the veins of your legs (deep vein thrombosis) or in your lungs (pulmonary embolism) and to reduce the risk of them occurring again.  What do you need to know about Xarelto ? The starting dose is one 15 mg tablet taken TWICE daily with food for the FIRST 21 DAYS then on January 25, 2024 the dose is changed to one 20 mg tablet taken ONCE A DAY with your evening meal.  DO NOT stop taking Xarelto  without talking to the health care provider who prescribed the medication.  Refill your prescription for 20 mg tablets before you run out.  After discharge, you should have regular check-up appointments with your healthcare provider that is prescribing your Xarelto .  In the future your dose may need to be changed if your kidney function changes by a significant amount.  What do you do if you miss a dose? If you are taking Xarelto  TWICE DAILY and you miss a dose, take it as soon as you remember. You may take two 15 mg tablets (total 30 mg) at the same time then resume your regularly scheduled 15 mg twice daily the next day.  If you are taking Xarelto  ONCE DAILY and you miss a dose, take it as soon as you remember on the same day then continue your regularly scheduled once daily regimen the next day. Do not take two doses of Xarelto  at the same time.   Important Safety Information Xarelto  is a blood thinner medicine that can cause bleeding. You should call your healthcare provider right away if you experience any of the following: Bleeding from an injury or your nose that does not stop. Unusual colored urine (red or dark brown) or unusual colored stools (red or black). Unusual bruising for unknown reasons. A serious fall or if you hit your head (even if there is no bleeding).  Some medicines may interact with Xarelto  and might increase your  risk of bleeding while on Xarelto . To help avoid this, consult your healthcare provider or pharmacist prior to using any new prescription or non-prescription medications, including herbals, vitamins, non-steroidal anti-inflammatory drugs (NSAIDs) and supplements.  This website has more information on Xarelto : [email or web address] .[email or web address].

## 2024-01-05 NOTE — Telephone Encounter (Signed)
 Patient Product/process development scientist completed.    The patient is insured through [facility] MEDICAID.     Ran test claim for Xarelto  20 mg and the current 30 day co-pay is $4.00.   This test claim was processed through [facility]- copay amounts may vary at other pharmacies due to pharmacy/plan contracts, or as the patient moves through the different stages of their insurance plan.     Reyes Sharps, CPHT Pharmacy Technician III Certified Patient Advocate [facility] Patient Advocate Team Direct Number: [phone]  Fax: [phone]

## 2024-01-05 NOTE — Inpatient Diabetes Management (Signed)
 Inpatient Diabetes Program Recommendations  AACE/ADA: New Consensus Statement on Inpatient Glycemic Control (2025)  Target Ranges:  Prepandial:   less than 140 mg/dL      Peak postprandial:   less than 180 mg/dL (1-2 hours)      Critically ill patients:  140 - 180 mg/dL   Lab Results  Component Value Date   GLUCAP 184 (H) 01/05/2024   HGBA1C 8.5 (A) 12/15/2023    Review of Glycemic Control  Latest Reference Range & Units 01/04/24 21:19 01/05/24 04:54 01/05/24 05:07 01/05/24 06:31  Glucose-Capillary 70 - 99 mg/dL 70 57 (L) 78 815 (H)   Diabetes history: DM 2 Outpatient Diabetes medications:  Novolog  10-20 units tid with meals Lantus  45 units daily Current orders for Inpatient glycemic control:  Novolog  0-20 units tid with meals and HS Novolog  8 units tid with meals Semglee  40 units daily Inpatient Diabetes Program Recommendations:    Consider reducing Novolog  to 6 units tid with meals and reduce Novolog  correction to moderate tid with meals. Also consider reducing Semglee  further to 35 units daily.   Thanks,  Randall Bullocks, RN, BC-ADM Inpatient Diabetes Coordinator Pager [phone]  (8a-5p)

## 2024-01-05 NOTE — Discharge Summary (Addendum)
 Physician Discharge Summary   Patient: [name] MRN: [id number] DOB: [birth year]  Admit date:     12/19/2023  Discharge date: 01/05/24  Discharge Physician: Reyes VEAR Gaw   PCP: Vicci Barnie NOVAK, MD   Recommendations at discharge:   Patient was discharged home on Xarelto  as a blood thinner rather than Eliquis  as he did have a right lower extremity DVT while taking the Eliquis .  This is recommendation by hematology while in house.  Also discharged home with as needed arm sling for his left arm pain deemed secondary to gout/severe DJD.  Should have outpatient follow-up with orthopedics for the same Recommended follow-up in the next month with nephrology due to his CKD stage IV. For 3 nights prior to his discharge he had hypoglycemic events despite increasingly lower doses of insulin .  Due to multiple episodes of hyperglycemic episodes he was eventually increased to 60 units of Lantus  daily while in house until the hypoglycemic events we switched him to 60 units daily then 50 units daily then 40 units daily at discharge.  At the time of discharge he was recommended to decrease to 40 units of Lantus  or lower based on his home blood sugars..  Discharge Diagnoses: Principal Problem:   COPD with acute exacerbation (HCC) Active Problems:   Acute on chronic combined systolic and diastolic CHF (congestive heart failure) (HCC)   Stage 3b chronic kidney disease (HCC)   Type 2 diabetes mellitus with hyperlipidemia (HCC)   Diabetic polyneuropathy associated with type 2 diabetes mellitus (HCC)   VT (ventricular tachycardia) (HCC)   Acute renal failure (ARF) (HCC)   Acute exacerbation of CHF (congestive heart failure) (HCC)   Acute respiratory failure with hypoxia (HCC)  Resolved Problems:   * No resolved hospital problems. [facility] Course: 58/M w cardiac arrest status post AICD placement, NICM, atrial fibrillation on anticoagulation, T2DM, stage IIIb CKD  who presented to the ED on  12/19/2023 with worsening shortness of breath over the last couple of days.  Work up in the ED included nonacute CXR, admitted with CHF exacerbation.  Cardiology consulted.  He continued to diurese well while in house.  Was moving towards discharge, however, on 7/18, discharge completed,, cards signed off,  but pt subsequently reported left-sided chest pain.  X-ray and troponins unremarkable - 7/19 AM, VQ scan ordered, but CP continued, had neg non con CT.  - 7/21, CRP elevated, started on IV steroids for question of pleurisy.  Pulm consulted> suspected to be musculoskeletal/gout combination -7/22: Pain extending to left shoulder and upper back, limited range of motion left shoulder> CT with severe glenohumeral osteoarthritis.  Ortho believes combination of severe OA plus possible gout contributing to pain. 7/26:  shoulder pain improving, but now with new LE edema Right leg 7/27:  Acute DVT noted in Right peroneal veins. 7/28: Switched to Xarelto  on recommendation of oncology to treat new onset DVT.  Based on the above the final working diagnosis for his left shoulder pain was severe DJD plus gout.  He was discharged home with as needed shoulder sling plus short-term course of oxycodone .  Also of note he was found to have a new right lower extremity DVT while on Eliquis .  He was switched to Xarelto  based on recommendations of heme-onc who was consulted while he was in house.  He was also noted to have hypoglycemic episodes for each of 3 nights prior to discharge.  He was on twice daily dosing of 60 units of Semglee  and this  was switched to once daily dosing on 7/27.  This was based on his home insulin  dosing.  Decrease to 60 units daily then 50 units daily and then discharged him on 40 units daily, with instructions to lower this further if he continues to have hypoglycemic episodes.  Due to his degree of improvement he was able to be discharged home on 01/05/2024.   Assessment & Plan: Acute on  chronic systolic CHF -Reason for admission, much improved.  Repeat echo with EF 40-45%, moderate LVH, grade 1 DD, normal RV - Improved with diuresis, 14 L negative - Now euvolemic, transitioned to oral torsemide , continue Toprol  -GDMT limited by CKD Stage III/IV   Acute DVT:  - New, acute DVT Left leg.  Reported new onset Left leg swelling yesterday.  US  revealed same today.  Failure treatment of Eliquis  - On treatment dose Lovenox . - Discussed with Dr. Gudenda from Heme/Onc, plan will be to switch to Xarelto  rather than continue outpatient Lovenox .   Left lateral chest wall pain Now severe left shoulder pain, limited ROM - Main complaint that is To her in the hospital this past week.  No rash noted on chest, Left sided chest/shoulder pain - New onset since 7/18, w/u thus far has been unremarkable, minus Left arm SVT noted on US  persistent from Nov 2024.  Echo w/o pericardial effusion,  -2D echo last week with stable cardiomyopathy, no pericardial effusion, No ST elevation consistent with pericarditis. - Pulmonology consulted, no new recommendations -Recommend to treat musculoskeletal pain; cannot give NSAIDs due to CKD stage IIIB - Fairly stable now.   Hypoglycemic episode:  - Report again last night.  CBG dropped to 60. - This is very low for him.  Reports that he usually runs 200 and 300 at home. -Likely secondary to CKD.  Will need to be careful with insulin . - I have stopped his evening dose of Semglee  to prevent further hypoglycemia  - Increasingly lower Semglee  doses while in house such that he was discharged on 40 units.   Gout - Orthopedics consulted and reports that left shoulder pain likely combination of OA/gout. - Gout is a possibility, uric acid elevated, continue Solu-Medrol  day 3 now -Solu-Medrol  changed to 40 mg IV daily-->switching to prednisone  50 mg 7/25.  Stop date of 7/30   Leukocytosis:  - Present fairly consistently since April of this year - recommend repeat  outpt to ensure resolution    Esophageal dysphagia - History of aspiration like episode prior to admission - Continue PPI, refer to GI outpatient - continue carafate  -Doing well thus far with this.   Paroxysmal atrial fibrillation - Currently in sinus rhythm, continue amiodarone , Eliquis    History of VT Continue amiodarone    OSA: intolerant even of new CPAP machine at home  - CPAP nightly   AKI on stage IIIb CKD: SCr on admission is 3.2 from baseline 2.7 - Improved and stable now at baseline of 2.5-2.7   IDT2DM with steroid induced hyperglycemia:  - Continue Semglee  - FU with endocrinology, Dr. Trixie.    HLD:  - Continue statin   Depression:  - Continue SSRI   BPH:  - Continue tamsulosin      Consultants: Cardiology, orthopedics, oncology Disposition: Good Diet recommendation:  Discharge Diet Orders (From admission, onward)     Start     Ordered   01/05/24 0000  Diet - low sodium heart healthy        01/05/24 1257  Low-sodium heart healthy DISCHARGE MEDICATION: Allergies as of 01/05/2024       Reactions   Bee Venom Anaphylaxis   Trulicity  [dulaglutide ] Anaphylaxis, Diarrhea   Extra dose of Trulicity  caused cardiac arrest. Patient experience onset of diarrhea with using this medication - cleared after med stopped.        Medication List     STOP taking these medications    apixaban  5 MG Tabs tablet Commonly known as: Eliquis    hydrALAZINE  25 MG tablet Commonly known as: APRESOLINE        TAKE these medications    acetaminophen  500 MG tablet Commonly known as: TYLENOL  Take 1,500 mg by mouth every 8 (eight) hours as needed for moderate pain (pain score 4-6).   amiodarone  200 MG tablet Commonly known as: PACERONE  Take 2 tablets (400 mg total) by mouth daily.   atorvastatin  10 MG tablet Commonly known as: LIPITOR Take 1 tablet (10 mg total) by mouth daily.   benzonatate  200 MG capsule Commonly known as: TESSALON  Take 200  mg by mouth 2 (two) times daily as needed for cough.   cetirizine  10 MG tablet Commonly known as: EQ Allergy Relief (Cetirizine ) Take 1 tablet (10 mg total) by mouth daily.   cholecalciferol  25 MCG (1000 UNIT) tablet Commonly known as: VITAMIN D3 Take 1,000 Units by mouth in the morning.   clonazePAM  0.5 MG tablet Commonly known as: KlonoPIN  Take 1 tablet (0.5 mg total) by mouth 2 (two) times daily as needed for anxiety. What changed: when to take this   Clotrimazole  1 % Oint Apply to the affected area twice a day What changed:  how much to take how to take this when to take this reasons to take this   colchicine  0.6 MG tablet 1/2 tab PO Q Mon/Wed/Fri What changed:  how much to take how to take this when to take this   EPINEPHrine  0.3 mg/0.3 mL Soaj injection Commonly known as: EPI-PEN Inject 0.3 mg into the muscle as needed. What changed: reasons to take this   famotidine  20 MG tablet Commonly known as: PEPCID  TAKE 1 TABLET BY MOUTH 1 TO 2 TIMES DAILY AS NEEDED   fluticasone  50 MCG/ACT nasal spray Commonly known as: FLONASE  Place 2 sprays into both nostrils daily.   gabapentin  300 MG capsule Commonly known as: NEURONTIN  Take 1 capsule (300 mg total) by mouth at bedtime.   Lantus  SoloStar 100 UNIT/ML Solostar Pen Generic drug: insulin  glargine Inject 30 Units into the skin daily. What changed: how much to take   loratadine  10 MG tablet Commonly known as: CLARITIN  Take 1 tablet (10 mg total) by mouth daily.   methocarbamol  500 MG tablet Commonly known as: ROBAXIN  Take 1 tablet (500 mg total) by mouth daily.   metolazone  5 MG tablet Commonly known as: ZAROXOLYN  Take 1 tablet (5 mg total) by mouth once a week. What changed: additional instructions   metoprolol  succinate 25 MG 24 hr tablet Commonly known as: TOPROL -XL Take 0.5 tablets (12.5 mg total) by mouth daily.   NovoLOG  FlexPen 100 UNIT/ML FlexPen Generic drug: insulin  aspart Inject 10-20 Units  into the skin 3 (three) times daily before meals. What changed: how much to take   nystatin  powder Commonly known as: MYCOSTATIN /NYSTOP  Apply 1 Application topically 3 (three) times daily.   oxyCODONE  5 MG immediate release tablet Commonly known as: Oxy IR/ROXICODONE  Take 1 tablet (5 mg total) by mouth every 6 (six) hours as needed for up to 5 days for severe pain (pain  score 7-10).   pantoprazole  40 MG tablet Commonly known as: Protonix  Take 1 tablet (40 mg total) by mouth daily.   potassium chloride  SA 20 MEQ tablet Commonly known as: KLOR-CON  M Take 2 tablets (40 mEq total) by mouth daily.   rivaroxaban  2.5 MG Tabs tablet Commonly known as: XARELTO  Take 6 tablets (15 mg total) by mouth 2 (two) times daily with a meal for 6 days, THEN 8 tablets (20 mg total) daily with supper. Start taking on: January 05, 2024   sertraline  50 MG tablet Commonly known as: Zoloft  Take 1.5 tablets (75 mg total) by mouth in the morning.   Symbicort  160-4.5 MCG/ACT inhaler Generic drug: budesonide -formoterol  Inhale 2 puffs into the lungs daily.   Systane 0.4-0.3 % Soln Generic drug: Polyethyl Glycol-Propyl Glycol Place 1 drop into both eyes daily as needed (dry eyes).   tamsulosin  0.4 MG Caps capsule Commonly known as: FLOMAX  Take 2 capsules (0.8 mg total) by mouth daily.   TechLite Plus Pen Needles 32G X 4 MM Misc Generic drug: Insulin  Pen Needle Use to inject insulin  into the skin 3 times per day.   timolol  0.5 % ophthalmic solution Commonly known as: TIMOPTIC  Place 1 drop into both eyes every morning.   torsemide  20 MG tablet Commonly known as: DEMADEX  Take 2 tablets (40 mg total) by mouth daily.        Discharge Exam: Filed Weights   01/03/24 0537 01/04/24 0537 01/05/24 0516  Weight: 131.2 kg 130.7 kg 130 kg   Gen: 58 y.o. male seated on side of bed, awake, alert, no complaints of pain today. Pulm: Non-labored breathing.  Clear to auscultation bilaterally.  CV: Regular  rate and rhythm.  GI: Abdomen soft, non-tender, non-distended Ext: Warm, no deformities, left arm in sling.  Able to move shoulder and more ranges of motion with sling.  Left lower extremity with trace calf/pedal edema.  Patient with +1 calf/pedal edema on the right, this is nontender. Skin: No rashes, lesions  Neuro: Alert and oriented. No focal neurological deficits. Psych: Calm  Judgement and insight appear normal. Mood & affect appropriate.    Condition at discharge: good  The results of significant diagnostics from this hospitalization (including imaging, microbiology, ancillary and laboratory) are listed below for reference.   Imaging Studies: VAS US  LOWER EXTREMITY VENOUS (DVT) Result Date: 01/04/2024  Lower Venous DVT Study Patient Name:  BRAYEN BUNN  Date of Exam:   01/03/2024 Medical Rec #: [id number]       Accession #:    [id number] Date of Birth: [birth year]       Patient Gender: M Patient Age:   69 years Exam Location:  [facility] Procedure:      VAS US  LOWER EXTREMITY VENOUS (DVT) Referring Phys: Nedim Oki --------------------------------------------------------------------------------  Indications: Edema.  Risk Factors: Immobility-has been inpatient for 16 days. Anticoagulation: Eliquis . Comparison Study: No prior study on file Performing Technologist: Alberta Lis RVS  Examination Guidelines: A complete evaluation includes B-mode imaging, spectral Doppler, color Doppler, and power Doppler as needed of all accessible portions of each vessel. Bilateral testing is considered an integral part of a complete examination. Limited examinations for reoccurring indications may be performed as noted. The reflux portion of the exam is performed with the patient in reverse Trendelenburg.  +---------+---------------+---------+-----------+----------+--------------+ RIGHT    CompressibilityPhasicitySpontaneityPropertiesThrombus Aging  +---------+---------------+---------+-----------+----------+--------------+ CFV      Full           Yes      Yes                                 +---------+---------------+---------+-----------+----------+--------------+  SFJ      Full                                                        +---------+---------------+---------+-----------+----------+--------------+ FV Prox  Full           Yes      Yes                                 +---------+---------------+---------+-----------+----------+--------------+ FV Mid   Full                                                        +---------+---------------+---------+-----------+----------+--------------+ FV DistalFull           Yes      Yes                                 +---------+---------------+---------+-----------+----------+--------------+ PFV      Full                                                        +---------+---------------+---------+-----------+----------+--------------+ POP      Full           Yes      Yes                                 +---------+---------------+---------+-----------+----------+--------------+ PTV      Full                                                        +---------+---------------+---------+-----------+----------+--------------+ PERO     None           No       No                   Acute          +---------+---------------+---------+-----------+----------+--------------+ Gastroc  Full                                                        +---------+---------------+---------+-----------+----------+--------------+   +----+---------------+---------+-----------+----------+--------------+ LEFTCompressibilityPhasicitySpontaneityPropertiesThrombus Aging +----+---------------+---------+-----------+----------+--------------+ CFV Full           Yes      Yes                                  +----+---------------+---------+-----------+----------+--------------+ SFJ Full                                                        +----+---------------+---------+-----------+----------+--------------+  Summary: RIGHT: - Findings consistent with acute deep vein thrombosis involving the right peroneal veins.  - A cystic structure is found in the popliteal fossa.  LEFT: - No evidence of common femoral vein obstruction.   *See table(s) above for measurements and observations. Electronically signed by Norman Serve on 01/04/2024 at 2:33:22 PM.    Final    VAS US  UPPER EXTREMITY VENOUS DUPLEX Result Date: 12/29/2023 UPPER VENOUS STUDY  Patient Name:  ESTELL DILLINGER  Date of Exam:   12/29/2023 Medical Rec #: [id number]       Accession #:    [id number] Date of Birth: 1966-04-19       Patient Gender: M Patient Age:   41 years Exam Location:  [facility] Procedure:      VAS US  UPPER EXTREMITY VENOUS DUPLEX Referring Phys: SIGURD PAC --------------------------------------------------------------------------------  Indications: Edema Comparison Study: 11/2023 left cephalic vein SVT Performing Technologist: Elmarie Lindau, RVT  Examination Guidelines: A complete evaluation includes B-mode imaging, spectral Doppler, color Doppler, and power Doppler as needed of all accessible portions of each vessel. Bilateral testing is considered an integral part of a complete examination. Limited examinations for reoccurring indications may be performed as noted.  Right Findings: +----------+------------+---------+-----------+----------+---------------------+ RIGHT     CompressiblePhasicitySpontaneousProperties       Summary        +----------+------------+---------+-----------+----------+---------------------+ Subclavian                                           normal coaptation,                                                          color flow, and                                                               waveform        +----------+------------+---------+-----------+----------+---------------------+  Left Findings: +----------+------------+---------+-----------+----------+---------------+ LEFT      CompressiblePhasicitySpontaneousProperties    Summary     +----------+------------+---------+-----------+----------+---------------+ IJV           Full                                  thickened walls +----------+------------+---------+-----------+----------+---------------+ Subclavian    Full                                                  +----------+------------+---------+-----------+----------+---------------+ Axillary      Full                                                  +----------+------------+---------+-----------+----------+---------------+ Brachial      Full                                                  +----------+------------+---------+-----------+----------+---------------+  Radial        Full                                                  +----------+------------+---------+-----------+----------+---------------+ Ulnar         Full                                                  +----------+------------+---------+-----------+----------+---------------+ Cephalic      Full                                  thickened walls +----------+------------+---------+-----------+----------+---------------+ Basilic       Full                                                  +----------+------------+---------+-----------+----------+---------------+  Summary:  Right: No evidence of thrombosis in the subclavian.  Left: No evidence of deep vein thrombosis in the upper extremity. There has been some resolution of the left cephalic vein SVT when compared to the prior exam.  *See table(s) above for measurements and observations.  Diagnosing physician: Norman Serve Electronically signed by Norman Serve on 12/29/2023 at 3:27:25 PM.     Final    CT SHOULDER LEFT WO CONTRAST Result Date: 12/29/2023 CLINICAL DATA:  Chronic shoulder pain. EXAM: CT OF THE UPPER LEFT EXTREMITY WITHOUT CONTRAST TECHNIQUE: Multidetector CT imaging of the upper left extremity was performed according to the standard protocol. RADIATION DOSE REDUCTION: This exam was performed according to the departmental dose-optimization program which includes automated exposure control, adjustment of the mA and/or kV according to patient size and/or use of iterative reconstruction technique. COMPARISON:  Left humerus radiographs dated 07/28/2013. FINDINGS: Bones/Joint/Cartilage No acute fracture or dislocation. Partially visualized lateral plate and screw fixation of a remote mid humeral diaphyseal fracture. Severe glenohumeral osteoarthritis manifested by marked joint space narrowing, bulky marginal humeral osteophytosis, and subchondral sclerosis. Mesoacromial os acromiale with moderate osteoarthritis of the acromioclavicular joint manifested by joint space narrowing and osteophytosis. Ligaments Ligaments are suboptimally evaluated by CT. Muscles and Tendons Muscles are normal. No muscle atrophy. No intramuscular fluid collection or hematoma. Soft tissue No fluid collection or hematoma. No soft tissue mass. Partially visualized left subclavian dual lead pacemaker. IMPRESSION: 1. Severe glenohumeral osteoarthritis. 2. Moderate acromioclavicular osteoarthritis with os acromiale. 3. Partially visualized lateral plate and screw fixation of a remote mid humeral diaphyseal fracture. Electronically Signed   By: Harrietta Sherry M.D.   On: 12/29/2023 14:54   NM Pulmonary Perfusion Result Date: 12/27/2023 CLINICAL DATA:  Chest pain. EXAM: NUCLEAR MEDICINE PERFUSION LUNG SCAN TECHNIQUE: Perfusion images were obtained in multiple projections after intravenous injection of radiopharmaceutical. Ventilation scans intentionally deferred if perfusion scan and chest x-ray adequate for  interpretation during COVID 19 epidemic. RADIOPHARMACEUTICALS:  4.4 mCi Tc-7m MAA IV COMPARISON:  Chest radiograph, 12/25/2023.  Chest CT, 12/27/2023. FINDINGS: There are no areas of abnormal perfusion. Specifically, there are no segmental defects to suggest pulmonary thromboembolism. IMPRESSION: Normal exam. Electronically Signed   By: Alm  Ormond M.D.   On: 12/27/2023 15:43   CT CHEST WO CONTRAST Result Date: 12/27/2023 CLINICAL DATA:  Nonspecific chest pain EXAM: CT CHEST WITHOUT CONTRAST TECHNIQUE: Multidetector CT imaging of the chest was performed following the standard protocol without IV contrast. RADIATION DOSE REDUCTION: This exam was performed according to the departmental dose-optimization program which includes automated exposure control, adjustment of the mA and/or kV according to patient size and/or use of iterative reconstruction technique. COMPARISON:  None Available. FINDINGS: Cardiovascular: Left subclavian dual lead pacemaker in place with leads within the right atrium and right ventricle. No significant coronary artery calcification. Global cardiac size ismildly enlarged. No pericardial effusion. Central pulmonary arteries are of normal caliber. No significant atherosclerotic calcification within the thoracic aorta. No aortic aneurysm. Mediastinum/Nodes: No enlarged mediastinal or axillary lymph nodes. Thyroid  gland, trachea, and esophagus demonstrate no significant findings. Tiny hiatal hernia Lungs/Pleura: Lungs are clear. No pleural effusion or pneumothorax. Upper Abdomen: 15 mm right and 20 mm left adrenal nodules are again identified and are stable since remote prior examination of 09/17/2017, safely considered benign and likely representing benign adrenal adenoma given their stability over time. No acute abnormality. Musculoskeletal: No chest wall mass or suspicious bone lesions identified. IMPRESSION: 1. No acute intrathoracic findings. 2. Mild cardiomegaly. 3. Tiny hiatal hernia.  4. 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 Electronically Signed   By: Dorethia Molt M.D.   On: 12/27/2023 10:23   DG CHEST PORT 1 VIEW Result Date: 12/25/2023 CLINICAL DATA:  Chest pain EXAM: PORTABLE CHEST 1 VIEW COMPARISON:  X-ray 12/19/2023. FINDINGS: Underinflation. Enlarged cardiopericardial silhouette. Tortuous ectatic aorta. No pneumothorax or effusion. No edema. Bronchovascular crowding. Left upper chest battery pack with defibrillator leads along the right side of the heart. Overlapping cardiac leads. Film is under penetrated. IMPRESSION: Underinflation. Enlarged cardiac silhouette with some prominence of central vasculature. No edema. Defibrillator. Electronically Signed   By: Ranell Bring M.D.   On: 12/25/2023 15:34   ECHOCARDIOGRAM COMPLETE Result Date: 12/22/2023    ECHOCARDIOGRAM REPORT   Patient Name:   AYRTON MCVAY Date of Exam: 12/22/2023 Medical Rec #:  [id number]      Height:       73.0 in Accession #:    [id number]     Weight:       288.4 lb Date of Birth:  Jan 25, 1966      BSA:          2.514 m Patient Age:    58 years       BP:           116/57 mmHg Patient Gender: M              HR:           66 bpm. Exam Location:  Inpatient Procedure: 2D Echo, Cardiac Doppler, Color Doppler and Intracardiac            Opacification Agent (Both Spectral and Color Flow Doppler were            utilized during procedure). Indications:    CHF  History:        Patient has prior history of Echocardiogram examinations, most                 recent 09/03/2023. Defibrillator, COPD; Risk Factors:Diabetes,                 Hypertension  and Dyslipidemia.  Sonographer:    Philomena Daring Referring Phys: [id number] LONNI LITTIE NANAS  Sonographer Comments: Patient is obese. IMPRESSIONS  1. Left ventricular ejection fraction, by estimation, is 40 to 45%. The left ventricle has  mildly decreased function. The left ventricle demonstrates global hypokinesis. The left ventricular internal cavity size was mildly dilated. There is moderate concentric left ventricular hypertrophy. Left ventricular diastolic parameters are consistent with Grade I diastolic dysfunction (impaired relaxation).  2. Right ventricular systolic function is normal. The right ventricular size is normal. Tricuspid regurgitation signal is inadequate for assessing PA pressure.  3. Left atrial size was mildly dilated.  4. The mitral valve is normal in structure. No evidence of mitral valve regurgitation.  5. The aortic valve is tricuspid. Aortic valve regurgitation is not visualized. No aortic stenosis is present.  6. The inferior vena cava is normal in size with greater than 50% respiratory variability, suggesting right atrial pressure of 3 mmHg. Comparison(s): No significant change from prior study. Prior images reviewed side by side. FINDINGS  Left Ventricle: Left ventricular ejection fraction, by estimation, is 40 to 45%. The left ventricle has mildly decreased function. The left ventricle demonstrates global hypokinesis. The left ventricular internal cavity size was mildly dilated. There is  moderate concentric left ventricular hypertrophy. Left ventricular diastolic parameters are consistent with Grade I diastolic dysfunction (impaired relaxation). Right Ventricle: The right ventricular size is normal. Right vetricular wall thickness was not well visualized. Right ventricular systolic function is normal. Tricuspid regurgitation signal is inadequate for assessing PA pressure. Left Atrium: Left atrial size was mildly dilated. Right Atrium: Right atrial size was not well visualized. Pericardium: There is no evidence of pericardial effusion. Mitral Valve: The mitral valve is normal in structure. No evidence of mitral valve regurgitation. Tricuspid Valve: The tricuspid valve is not well visualized. Tricuspid valve  regurgitation is not demonstrated. Aortic Valve: The aortic valve is tricuspid. Aortic valve regurgitation is not visualized. No aortic stenosis is present. Pulmonic Valve: The pulmonic valve was grossly normal. Pulmonic valve regurgitation is mild. No evidence of pulmonic stenosis. Aorta: The aortic root and ascending aorta are structurally normal, with no evidence of dilitation. Venous: The inferior vena cava is normal in size with greater than 50% respiratory variability, suggesting right atrial pressure of 3 mmHg. IAS/Shunts: The interatrial septum was not well visualized. Additional Comments: A device lead is visualized.  LEFT VENTRICLE PLAX 2D LVIDd:         5.90 cm   Diastology LVIDs:         4.20 cm   LV e' medial:   3.81 cm/s LV PW:         1.30 cm   LV E/e' medial: 14.3 LV IVS:        1.50 cm LVOT diam:     2.60 cm LV SV:         99 LV SV Index:   39 LVOT Area:     5.31 cm  IVC IVC diam: 1.60 cm LEFT ATRIUM             Index        RIGHT ATRIUM           Index LA diam:        3.60 cm 1.43 cm/m   RA Area:     14.80 cm LA Vol (A2C):   41.7 ml 16.59 ml/m  RA Volume:   39.00 ml  15.52 ml/m LA Vol (A4C):   41.6 ml  16.55 ml/m LA Biplane Vol: 42.3 ml 16.83 ml/m  AORTIC VALVE LVOT Vmax:   86.50 cm/s LVOT Vmean:  58.400 cm/s LVOT VTI:    0.186 m  AORTA Ao Root diam: 3.70 cm Ao Asc diam:  3.40 cm MITRAL VALVE MV Area (PHT): 3.37 cm    SHUNTS MV Decel Time: 225 msec    Systemic VTI:  0.19 m MV E velocity: 54.50 cm/s  Systemic Diam: 2.60 cm MV A velocity: 97.90 cm/s MV E/A ratio:  0.56 Mihai Croitoru MD Electronically signed by Jerel Balding MD Signature Date/Time: 12/22/2023/4:07:17 PM    Final    US  EKG SITE RITE Result Date: 12/20/2023 If Site Rite image not attached, placement could not be confirmed due to current cardiac rhythm.  DG Chest Port 1 View Result Date: 12/19/2023 CLINICAL DATA:  Chest pain EXAM: PORTABLE CHEST 1 VIEW COMPARISON:  Chest x-ray 10/10/2023 FINDINGS: The cardiac silhouette is  enlarged, unchanged. Left lung base has been excluded from the exam. Left-sided pacemaker is unchanged in position. The lungs are clear. There is no pneumothorax or acute fracture. IMPRESSION: 1. No active disease. 2. Cardiomegaly. Electronically Signed   By: Greig Pique M.D.   On: 12/19/2023 18:44    Microbiology: Results for orders placed or performed during the hospital encounter of 10/07/23  Culture, blood (Routine X 2) w Reflex to ID Panel     Status: None   Collection Time: 10/08/23 11:52 AM   Specimen: BLOOD LEFT HAND  Result Value Ref Range Status   Specimen Description BLOOD LEFT HAND  Final   Special Requests   Final    BOTTLES DRAWN AEROBIC AND ANAEROBIC Blood Culture adequate volume   Culture   Final    NO GROWTH 5 DAYS Performed at [facility], [address], [place], [place] [address]    Report Status 10/13/2023 FINAL  Final  Culture, blood (Routine X 2) w Reflex to ID Panel     Status: None   Collection Time: 10/08/23 11:52 AM   Specimen: BLOOD LEFT HAND  Result Value Ref Range Status   Specimen Description BLOOD LEFT HAND  Final   Special Requests AEROBIC BOTTLE ONLY Blood Culture adequate volume  Final   Culture   Final    NO GROWTH 5 DAYS Performed at [facility], [address] [place], [place] [address]    Report Status 10/13/2023 FINAL  Final   *Note: Due to a large number of results and/or encounters for the requested time period, some results have not been displayed. A complete set of results can be found in Results Review.    Labs: CBC: Recent Labs  Lab 12/30/23 0228 12/31/23 0231 01/01/24 0237 01/03/24 0228 01/04/24 1157  WBC 14.3* 12.4* 11.9* 14.2* 10.7*  NEUTROABS  --   --   --   --  8.4*  HGB 11.8* 12.2* 13.8 12.6* 13.7  HCT 35.4* 37.3* 41.7 38.8* 42.1  MCV 93.2 93.0 93.1 92.6 92.9  PLT 268 290 283 311 317   Basic Metabolic Panel: Recent Labs  Lab 12/30/23 0228 12/31/23 0231 01/01/24 0237 01/03/24 0228 01/04/24 1157   NA 135 138 140 142 137  K 4.2 4.4 3.7 3.4* 4.3  CL 99 103 101 102 98  CO2 24 26 27 27 26   GLUCOSE 265* 226* 112* 75 227*  BUN 55* 60* 58* 58* 57*  CREATININE 2.63* 2.46* 2.31* 2.44* 2.68*  CALCIUM  8.5* 8.8* 9.0 9.0 9.2   Liver Function  Tests: No results for input(s): AST, ALT, ALKPHOS, BILITOT, PROT, ALBUMIN  in the last 168 hours. CBG: Recent Labs  Lab 01/04/24 2119 01/05/24 0454 01/05/24 0507 01/05/24 0631 01/05/24 1134  GLUCAP 70 57* 78 184* 195*    Discharge time spent: less than 30 minutes.  Signed: Reyes VEAR Gaw, MD [facility] 01/05/2024

## 2024-01-05 NOTE — Progress Notes (Signed)
 Discharge orders received.  IV and telemetry removed, CCMD notified.  MD made aware that pt intends to drive himself home, he's parked in ED parking.  MD approved of this plan.  Instructions reviewed. Meds to be picked up from [facility] pharmacy on the way out.

## 2024-01-06 ENCOUNTER — Telehealth: Payer: Self-pay | Admitting: *Deleted

## 2024-01-06 ENCOUNTER — Telehealth: Payer: Self-pay | Admitting: Hematology and Oncology

## 2024-01-06 NOTE — Telephone Encounter (Signed)
 Spoke with patient confirming upcoming appointment

## 2024-01-06 NOTE — Transitions of Care (Post Inpatient/ED Visit) (Signed)
   01/06/2024  Name: [name] MRN: [id number] DOB: [birth year]  Today's TOC FU Call Status: Today's TOC FU Call Status:: Unsuccessful Call (1st Attempt) Unsuccessful Call (1st Attempt) Date: 01/06/24  Attempted to reach the patient regarding the most recent Inpatient/ED visit.  Follow Up Plan: Additional outreach attempts will be made to reach the patient to complete the Transitions of Care (Post Inpatient/ED visit) call.   Andrea Dimes RN, BSN [place]  [facility] RN Care Manager [phone]
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
## 2024-01-07 ENCOUNTER — Telehealth (HOSPITAL_COMMUNITY): Admitting: Psychiatry

## 2024-01-07 ENCOUNTER — Other Ambulatory Visit: Payer: Self-pay | Admitting: Internal Medicine

## 2024-01-07 ENCOUNTER — Telehealth: Payer: Self-pay | Admitting: *Deleted

## 2024-01-07 ENCOUNTER — Encounter (HOSPITAL_COMMUNITY): Payer: Self-pay | Admitting: Psychiatry

## 2024-01-07 DIAGNOSIS — F411 Generalized anxiety disorder: Secondary | ICD-10-CM | POA: Diagnosis not present

## 2024-01-07 DIAGNOSIS — F410 Panic disorder [episodic paroxysmal anxiety] without agoraphobia: Secondary | ICD-10-CM

## 2024-01-07 DIAGNOSIS — F4310 Post-traumatic stress disorder, unspecified: Secondary | ICD-10-CM

## 2024-01-07 MED ORDER — CLONAZEPAM 0.5 MG PO TABS
0.5000 mg | ORAL_TABLET | Freq: Two times a day (BID) | ORAL | 1 refills | Status: DC | PRN
Start: 2024-02-18 — End: 2024-04-19

## 2024-01-07 NOTE — Telephone Encounter (Signed)
 Copied from CRM [id number]. Topic: Clinical - Medication Question >> Jan 07, 2024  3:46 PM Marissa P wrote:  Reason for CRM: Patient called unsure what the er prescribed and wanting to see if its possible to see what they were and get sent to/ call pharmacy please Lucky Andrea LABOR, RN

## 2024-01-07 NOTE — Transitions of Care (Post Inpatient/ED Visit) (Signed)
   01/07/2024  Name: [name] MRN: [id number] DOB: 02/25/1966  Today's TOC FU Call Status: Today's TOC FU Call Status:: Unsuccessful Call (2nd Attempt) Unsuccessful Call (2nd Attempt) Date: 01/07/24  Attempted to reach the patient regarding the most recent Inpatient/ED visit.  [name] was unable to complete this telephone outreach. RNCM will attempt to reach tomorrow.  Follow Up Plan: Additional outreach attempts will be made to reach the patient to complete the Transitions of Care (Post Inpatient/ED visit) call.   Andrea Dimes RN, BSN [place]  [facility] RN Care Manager [phone]

## 2024-01-08 ENCOUNTER — Telehealth: Payer: Self-pay | Admitting: *Deleted

## 2024-01-08 ENCOUNTER — Telehealth: Payer: Self-pay

## 2024-01-08 DIAGNOSIS — G4733 Obstructive sleep apnea (adult) (pediatric): Secondary | ICD-10-CM | POA: Diagnosis not present

## 2024-01-08 NOTE — Telephone Encounter (Signed)
 Requested medications are due for refill today.  unsure  Requested medications are on the active medications list.  yes  Last refill. 12/15/2023 #30 1 rf  Future visit scheduled.   yes  Notes to clinic.  Pharmacy comment: Conflict with other medication, disease or allergy. Flagged for interaction with Atorvastatin . Plz advise. Thx.    Requested Prescriptions  Pending Prescriptions Disp Refills   colchicine  0.6 MG tablet [Pharmacy Med Name: COLCHICINE  0.6MG     TAB] 30 tablet 1    Sig: TAKE 1/2 (ONE-HALF) TABLET BY MOUTH ON MONDAY, WEDNESDAY AND FRIDAY     Endocrinology:  Gout Agents - colchicine  Failed - 01/08/2024 12:51 PM      Failed - Cr in normal range and within 360 days    Creat  Date Value Ref Range Status  04/24/2016 1.68 (H) 0.70 - 1.33 mg/dL Final    Comment:      For patients > or = 59 years of age: The upper reference limit for Creatinine is approximately 13% higher for people identified as African-American.      Creatinine, Ser  Date Value Ref Range Status  01/04/2024 2.68 (H) 0.61 - 1.24 mg/dL Final   Creatinine, POC  Date Value Ref Range Status  10/29/2022 146.0 mg/dL Final    Comment:    Abstracted by HIM   Creatinine, Urine  Date Value Ref Range Status  10/08/2023 97 mg/dL Final    Comment:    Performed at [facility], [address], [place], [place] [address]         Failed - ALT in normal range and within 360 days    ALT  Date Value Ref Range Status  12/27/2023 47 (H) 0 - 44 U/L Final         Failed - CBC within normal limits and completed in the last 12 months    WBC  Date Value Ref Range Status  01/04/2024 10.7 (H) 4.0 - 10.5 K/uL Final   RBC  Date Value Ref Range Status  01/04/2024 4.53 4.22 - 5.81 MIL/uL Final   Hemoglobin  Date Value Ref Range Status  01/04/2024 13.7 13.0 - 17.0 g/dL Final  96/79/7976 85.9 13.0 - 17.7 g/dL Final   HCT  Date Value Ref Range Status  01/04/2024 42.1 39.0 - 52.0 % Final   Hematocrit  Date  Value Ref Range Status  08/26/2021 40.9 37.5 - 51.0 % Final   MCHC  Date Value Ref Range Status  01/04/2024 32.5 30.0 - 36.0 g/dL Final   [facility]  Date Value Ref Range Status  01/04/2024 30.2 26.0 - 34.0 pg Final   MCV  Date Value Ref Range Status  01/04/2024 92.9 80.0 - 100.0 fL Final  08/26/2021 88 79 - 97 fL Final   No results found for: PLTCOUNTKUC, LABPLAT, POCPLA RDW  Date Value Ref Range Status  01/04/2024 15.1 11.5 - 15.5 % Final  08/26/2021 13.0 11.6 - 15.4 % Final         Passed - AST in normal range and within 360 days    AST  Date Value Ref Range Status  12/27/2023 37 15 - 41 U/L Final         Passed - Valid encounter within last 12 months    Recent Outpatient Visits           2 months ago Right-sided epistaxis   [place] [facility] - [facility] Dept Of [place]. [facility] [place], [place] B,  MD   4 months ago Tinea cruris   [place] [facility] [place] - A Dept Of [place]. [facility] Delbert Clam, MD   5 months ago Jock itch   [place] [facility] [place] - A Dept Of [place]. [facility] [place], [place] W, NP   6 months ago Type 2 diabetes mellitus with morbid obesity [facility])   [place] [facility] - A Dept Of [place]. [facility] Vicci Barnie NOVAK, MD   7 months ago Hospital discharge follow-up   [facility] - A Dept Of [place]. [facility] Vicci Barnie NOVAK, MD

## 2024-01-08 NOTE — Telephone Encounter (Signed)
 Copied from CRM [id number]. Topic: Clinical - Medication Question >> Jan 08, 2024  3:47 PM Jayma L wrote: Reason for CRM: the pharmacy called , spoke to Ronal Fellows, she wanted to know if the doctor wants the patient to be on Colchicine  and amiodarone  200 mg . Please follow up with pharmacy  [phone]

## 2024-01-08 NOTE — Telephone Encounter (Signed)
 Pt verbally confirmed appt for 8/4

## 2024-01-08 NOTE — Telephone Encounter (Signed)
 Called the pharmacy and spoke to [facility]. Informed that per Dr.Johnson it is ok to take the colchicine , at a very low dose 3 times a week, along with amiodarone  and other medications. Ronal Fellows expressed verbal understanding. No further assistance needed at this time.

## 2024-01-08 NOTE — Telephone Encounter (Signed)
 Requested medication (s) are due for refill today:   Provider to review  Requested medication (s) are on the active medication list:   Yes  Future visit scheduled:   Yes 8/29 with Dr. Vicci;     LOV 11/06/2023   Last ordered: 10/13/2023 #180, 3 refills  Non delegated refill.         Requested Prescriptions  Pending Prescriptions Disp Refills   amiodarone  (PACERONE ) 200 MG tablet [Pharmacy Med Name: AMIODARONE  200MG     TAB] 30 tablet 0    Sig: Take 1 tablet by mouth once daily     Not Delegated - Cardiovascular: Antiarrhythmic Agents - amiodarone  Failed - 01/08/2024 12:51 PM      Failed - This refill cannot be delegated      Failed - Manual Review: Eye exam recommended every 12 months      Failed - ALT in normal range and within 180 days    ALT  Date Value Ref Range Status  12/27/2023 47 (H) 0 - 44 U/L Final         Passed - TSH in normal range and within 360 days    TSH  Date Value Ref Range Status  10/23/2023 1.428 0.350 - 4.500 uIU/mL Final    Comment:    Performed by a 3rd Generation assay with a functional sensitivity of <=0.01 uIU/mL. Performed at [facility], [address], [place], [place] [address]   08/19/2022 0.930 0.450 - 4.500 uIU/mL Final         Passed - Mg Level in normal range and within 360 days    Magnesium   Date Value Ref Range Status  12/25/2023 2.1 1.7 - 2.4 mg/dL Final    Comment:    Performed at [facility], [address], [place], [place] [address]         Passed - K in normal range and within 180 days    Potassium  Date Value Ref Range Status  01/04/2024 4.3 3.5 - 5.1 mmol/L Final         Passed - AST in normal range and within 180 days    AST  Date Value Ref Range Status  12/27/2023 37 15 - 41 U/L Final         Passed - Patient had ECG in the last 180 days      Passed - Patient is not pregnant      Passed - Last BP in normal range    BP Readings from Last 1 Encounters:  01/05/24 116/67         Passed - Last  Heart Rate in normal range    Pulse Readings from Last 1 Encounters:  01/05/24 64         Passed - Valid encounter within last 6 months    Recent Outpatient Visits           2 months ago Right-sided epistaxis   [place] [facility] [place] - A Dept Of [place]. [facility] Vicci Barnie NOVAK, MD   4 months ago Tinea cruris   [place] [facility] [place] - A Dept Of [place]. [facility] Delbert Clam, MD   5 months ago Jock itch   [place] [facility] [place] - A Dept Of [place]. [facility] [place], [place] W, NP   6 months ago Type 2 diabetes mellitus with morbid obesity [facility])   [place] [facility] - A Dept  Of [place]. [facility] Vicci Barnie NOVAK, MD   7 months ago Hospital discharge follow-up   [facility] - A Dept Of [place]. [facility] Vicci Barnie NOVAK, MD              Passed - Patient had chest x-ray within the last 6 months

## 2024-01-08 NOTE — Transitions of Care (Post Inpatient/ED Visit) (Signed)
   01/08/2024  Name: DANIELLE LENTO MRN: [id number] DOB: [birth year]  Today's TOC FU Call Status: Today's TOC FU Call Status:: Unsuccessful Call (3rd Attempt) Unsuccessful Call (3rd Attempt) Date: 01/08/24  Attempted to reach the patient regarding the most recent Inpatient/ED visit.  Follow Up Plan: No further outreach attempts will be made at this time. We have been unable to contact the patient.  Andrea Dimes RN, BSN [place]  [facility] RN Care Manager [phone]

## 2024-01-08 NOTE — Telephone Encounter (Signed)
 The answer is yes.  We have changed the colchicine  to a very low dose and he only takes it 3 days a week.

## 2024-01-11 ENCOUNTER — Encounter: Attending: Cardiology

## 2024-01-11 ENCOUNTER — Inpatient Hospital Stay: Attending: Hematology and Oncology | Admitting: Hematology and Oncology

## 2024-01-11 DIAGNOSIS — I5022 Chronic systolic (congestive) heart failure: Secondary | ICD-10-CM | POA: Insufficient documentation

## 2024-01-11 DIAGNOSIS — Z95810 Presence of automatic (implantable) cardiac defibrillator: Secondary | ICD-10-CM | POA: Insufficient documentation

## 2024-01-13 NOTE — Progress Notes (Signed)
 EPIC Encounter for ICM Monitoring  Patient Name: [name] is a 58 y.o. male Date: 01/13/2024 Primary Care Physican: Vicci Barnie NOVAK, MD Primary Cardiologist: Crenshaw/[name] Electrophysiologist: [name] 08/24/2023 Office Weight: 295 lbs    09/28/2023 Weight: 291 lbs       10/06/2023 Weight: unknown lbs   10/11/2023 Weight: 279.5 lbs hospital weight 10/12/2023 Weight: 279.9 lbs hospital weight 10/13/2023  Weight: 280.2 lbs discharge weight 10/20/2023 Weight:  Not weighing at home    10/23/2023 Weight: Pt reports 5/27 he thinks he was 285 lbs at home 11/03/2023 Weight: 294 lbs (10 lb weight gain since 5/16) 12/15/2023 Office Weight: 289 lbs 01/02/2024 Hospital Weight: 285.5 lbs 01/13/2024 Weight: 277 lbs   Spoke with patient and heart failure questions reviewed.  Transmission results reviewed.  Pt reports he is still not feeling well since hospital discharge.  He has some swelling in feet and knee (left one).  Back and neck pain.  Hospitalized 7/12-7/29 for CHF and new DVT left leg.   Corvue thoracic impedance suggesting normal fluid levels since 7/21-7/28 and 7/30-8/2.   Prescribed dosage:  Torsemide  20 mg take 2 tablet(s) (40 mg total) by mouth daily.  Potassium 20 mEq take 2 tablet(s) (40 mEq total) by mouth daily.  Metolazone  5 mg take 1 tablet by mouth once a week.  Pt takes on Mondays (Nephrologist said he could take it up to twice a week).     Labs: 01/04/2024 Creatinine 2.68, BUN 57, Potassium 4.3, Sodium 137, GFR 27  01/03/2024 Creatinine 2.44, BUN 58, Potassium 3.4, Sodium 142, GFR 30  01/01/2024 Creatinine 2.31, BUN 58, Potassium 3.7, Sodium 140, GFR 32  12/31/2023 Creatinine 2.46, BUN 60, Potassium 4.4, Sodium 138, GFR 29 12/30/2023 Creatinine 2.63, BUN 55, Potassium 4.2, Sodium 135, GFR 27  12/29/2023 Creatinine 2.81, BUN 51, Potassium 4.0, Sodium 134, GFR 25  12/28/2023 Creatinine 2.68, BUN 52, Potassium 3.4, Sodium 140, GFR 27  12/27/2023 Creatinine 2.69, BUN 54, Potassium  3.7, Sodium 136, GFR 26  12/26/2023 Creatinine 2.61, BUN 57, Potassium 3.3, Sodium 138, GFR 27  12/25/2023 Creatinine 2.57, BUN 57, Potassium 3.3, Sodium 138, GFR 28 12/24/2023 Creatinine 2.49, BUN 56, Potassium 3.8, Sodium 137, GFR 29  12/23/2023 Creatinine 2.66, BUN 65, Potassium 3.4, Sodium 138, GFR 27  12/22/2023 Creatinine 2.84, BUN 64, Potassium 3.7, Sodium 135, GFR 25  12/21/2023 Creatinine 3.00, BUN 62, Potassium 3.5, Sodium 134, GFR 23  12/19/2023 Creatinine 3.27, BUN 38, Potassium 3.0, Sodium 137, GFR 21 A complete set of results can be found in Results Review.   Recommendations:   No changes and encouraged to call if experiencing any fluid symptoms.   Follow-up plan: ICM clinic phone appointment on 02/01/2024.   91 day device clinic remote transmission 02/17/2024.     EP/Cardiology Office Visits:   Recall 12/22/2023 with Dr Gardenia (missed 01/05/2024 appt).   Recall 11/02/2023 with Dr [name] or [name], PA (6 month).     Copy of ICM check sent to Dr [name].   3 month ICM trend: 01/11/2024.    12-14 Month ICM trend:     [name], RN 01/13/2024 1:30 PM

## 2024-01-14 ENCOUNTER — Other Ambulatory Visit (HOSPITAL_COMMUNITY): Payer: Self-pay

## 2024-01-14 ENCOUNTER — Ambulatory Visit: Payer: Self-pay

## 2024-01-14 NOTE — Telephone Encounter (Signed)
  FYI Only or Action Required?: Action required by provider: medication refill request and update on patient condition.oxycodone   Patient was last seen in primary care on 11/06/2023 by Vicci Barnie NOVAK, MD.  Called Nurse Triage reporting Neck Pain and Shoulder Pain.  Symptoms began several weeks ago.  Interventions attempted: OTC medications: tylenol .  Symptoms are: unchanged.  Triage Disposition: See PCP When Office is Open (Within 3 Days)-   Patient/caregiver understands and will follow disposition?: No, refuses disposition   Message from [facility] sent at 01/14/2024  5:43 PM EDT  Summary: Medication refill    Pt stated needs a refill for Oxycodone , this was given to him when he was in the hospital. Please call pt at [phone]          Call History  Contact Date/Time Type Contact Phone/Fax By  01/14/2024 05:37 PM EDT Phone (Incoming) Peri Cheryal PARAS (Self)  Leigh Niemann   Reason for Disposition  Neck pain present > 2 weeks  Answer Assessment - Initial Assessment Questions States that he never picked up his oxycodone  on discharge   1. ONSET: When did the pain begin?      01/05/2024- on discharge from hospital 2. LOCATION: Where does it hurt?      Left neck and shoulder pain 3. PATTERN Does the pain come and go, or has it been constant since it started?      constant 4. SEVERITY: How bad is the pain?  (Scale 0-10; or none or slight stiffness, mild, moderate, severe)     10/10 5. RADIATION: Does the pain go anywhere else, shoot into your arms?     denies 6. CORD SYMPTOMS: Any weakness or numbness of the arms or legs?     denies 7. CAUSE: What do you think is causing the neck pain?     States that he was told that he has neck pain 8. NECK OVERUSE: Any recent activities that involved turning or twisting the neck?     N/a 9. OTHER SYMPTOMS: Do you have any other symptoms? (e.g., headache, fever, chest pain, difficulty breathing, neck swelling)     Underlying COPD, no new difficulty breathing  Protocols used: Neck Pain or Stiffness-A-AH

## 2024-01-15 ENCOUNTER — Telehealth (HOSPITAL_COMMUNITY): Payer: Self-pay

## 2024-01-15 NOTE — Telephone Encounter (Signed)
 Called to confirm/remind patient of their appointment at the [facility] on 01/18/24 3:00.   Appointment:   [x] Confirmed  [] Left mess   [] No answer/No voice mail  [] VM Full/unable to leave message  [] Phone not in service  Patient reminded to bring all medications and/or complete list.  Confirmed patient has transportation. Gave directions, instructed to utilize valet parking.

## 2024-01-15 NOTE — Telephone Encounter (Signed)
 Patient identified by name and date of birth.  Patient aware of provider response and voiced understanding.  Patient did admit that he took Advil even though he was advised not to. Patient also decided to wait to see Dr. Vicci at his next visit to pain management.

## 2024-01-15 NOTE — Telephone Encounter (Signed)
 Unfortunately, we do not prescribe oxycodone  in this clinic.  He needs to schedule visit with PCP when she is back in the office on Monday to discuss additional pain management options.

## 2024-01-18 ENCOUNTER — Encounter (HOSPITAL_COMMUNITY): Payer: Self-pay

## 2024-01-18 ENCOUNTER — Ambulatory Visit (HOSPITAL_COMMUNITY)
Admission: RE | Admit: 2024-01-18 | Discharge: 2024-01-18 | Disposition: A | Discharge status: Home / Self Care | Source: Ambulatory Visit | Attending: Physician Assistant | Admitting: Physician Assistant

## 2024-01-18 VITALS — BP 126/80 | HR 67 | Ht 72.0 in | Wt 283.8 lb

## 2024-01-18 DIAGNOSIS — N184 Chronic kidney disease, stage 4 (severe): Secondary | ICD-10-CM | POA: Diagnosis not present

## 2024-01-18 DIAGNOSIS — E1165 Type 2 diabetes mellitus with hyperglycemia: Secondary | ICD-10-CM | POA: Insufficient documentation

## 2024-01-18 DIAGNOSIS — U099 Post covid-19 condition, unspecified: Secondary | ICD-10-CM | POA: Insufficient documentation

## 2024-01-18 DIAGNOSIS — I82401 Acute embolism and thrombosis of unspecified deep veins of right lower extremity: Secondary | ICD-10-CM | POA: Insufficient documentation

## 2024-01-18 DIAGNOSIS — J449 Chronic obstructive pulmonary disease, unspecified: Secondary | ICD-10-CM | POA: Insufficient documentation

## 2024-01-18 DIAGNOSIS — Z794 Long term (current) use of insulin: Secondary | ICD-10-CM | POA: Diagnosis not present

## 2024-01-18 DIAGNOSIS — I472 Ventricular tachycardia, unspecified: Secondary | ICD-10-CM | POA: Insufficient documentation

## 2024-01-18 DIAGNOSIS — Z79899 Other long term (current) drug therapy: Secondary | ICD-10-CM | POA: Insufficient documentation

## 2024-01-18 DIAGNOSIS — Z7901 Long term (current) use of anticoagulants: Secondary | ICD-10-CM | POA: Diagnosis not present

## 2024-01-18 DIAGNOSIS — Z95810 Presence of automatic (implantable) cardiac defibrillator: Secondary | ICD-10-CM | POA: Diagnosis not present

## 2024-01-18 DIAGNOSIS — E1122 Type 2 diabetes mellitus with diabetic chronic kidney disease: Secondary | ICD-10-CM | POA: Diagnosis not present

## 2024-01-18 DIAGNOSIS — G4733 Obstructive sleep apnea (adult) (pediatric): Secondary | ICD-10-CM | POA: Diagnosis not present

## 2024-01-18 DIAGNOSIS — Z4502 Encounter for adjustment and management of automatic implantable cardiac defibrillator: Secondary | ICD-10-CM | POA: Insufficient documentation

## 2024-01-18 DIAGNOSIS — I5022 Chronic systolic (congestive) heart failure: Secondary | ICD-10-CM | POA: Diagnosis not present

## 2024-01-18 LAB — COMPREHENSIVE METABOLIC PANEL WITH GFR
ALT: 31 U/L (ref 0–44)
AST: 24 U/L (ref 15–41)
Albumin: 3.6 g/dL (ref 3.5–5.0)
Alkaline Phosphatase: 59 U/L (ref 38–126)
Anion gap: 15 (ref 5–15)
BUN: 37 mg/dL — ABNORMAL HIGH (ref 6–20)
CO2: 26 mmol/L (ref 22–32)
Calcium: 9.1 mg/dL (ref 8.9–10.3)
Chloride: 97 mmol/L — ABNORMAL LOW (ref 98–111)
Creatinine, Ser: 3.09 mg/dL — ABNORMAL HIGH (ref 0.61–1.24)
GFR, Estimated: 22 mL/min — ABNORMAL LOW (ref >60)
Glucose, Bld: 291 mg/dL — ABNORMAL HIGH (ref 70–99)
Potassium: 3.2 mmol/L — ABNORMAL LOW (ref 3.5–5.1)
Sodium: 138 mmol/L (ref 135–145)
Total Bilirubin: 0.8 mg/dL (ref 0.0–1.2)
Total Protein: 7.3 g/dL (ref 6.5–8.1)

## 2024-01-18 LAB — BRAIN NATRIURETIC PEPTIDE: B Natriuretic Peptide: 23.3 pg/mL (ref 0.0–100.0)

## 2024-01-18 LAB — TSH: TSH: 0.823 u[IU]/mL (ref 0.350–4.500)

## 2024-01-18 LAB — T4, FREE: Free T4: 1.22 ng/dL — ABNORMAL HIGH (ref 0.61–1.12)

## 2024-01-18 NOTE — Patient Instructions (Signed)
 Medication Changes:  TAKE AN EXTRA 40MG  OF TORSEMIDE  FOR THE NEXT 2 DAYS   THEN RETURN TO NORMAL DOSING   Lab Work:  Labs done today, your results will be available in MyChart, we will contact you for abnormal readings.  Follow-Up in: 4 MONTHS PLEASE CALL OUR OFFICE AROUND OCTOBER TO GET SCHEDULED FOR YOUR APPOINTMENT. PHONE NUMBER IS [phone] OPTION 2   At the [facility], you and your health needs are our priority. We have a designated team specialized in the treatment of Heart Failure. This Care Team includes your primary Heart Failure Specialized Cardiologist (physician), Advanced Practice Providers (APPs- Physician Assistants and Nurse Practitioners), and Pharmacist who all work together to provide you with the care you need, when you need it.   You may see any of the following providers on your designated Care Team at your next follow up:  Dr. Toribio Fuel Dr. Ezra Shuck Dr. Ria Commander Dr. Odis Brownie Greig Mosses, NP Caffie Shed, [place] [facility] [place], [place] Beckey Coe, NP [place] Lee, NP Tinnie Redman, PharmD   Please be sure to bring in all your medications bottles to every appointment.   Need to Contact Us :  If you have any questions or concerns before your next appointment please send us  a message through [place] or call our office at [phone].    TO LEAVE A MESSAGE FOR THE NURSE SELECT OPTION 2, PLEASE LEAVE A MESSAGE INCLUDING: YOUR NAME DATE OF BIRTH CALL BACK NUMBER REASON FOR CALL**this is important as we prioritize the call backs  YOU WILL RECEIVE A CALL BACK THE SAME DAY AS LONG AS YOU CALL BEFORE 4:00 PM

## 2024-01-18 NOTE — Addendum Note (Signed)
 Addended by: VICCI SELLER A on: 01/18/2024 03:27 PM   Modules accepted: Orders

## 2024-01-18 NOTE — Progress Notes (Addendum)
 ADVANCED HEART FAILURE CLINIC NOTE  Primary Care: Vicci Barnie NOVAK, MD Primary Cardiologist: Dr. Pietro EP: Dr. [name] Pulm: Dr. Meade Nephrology: Dr. Jerrye HF Cardiologist: Dr. Gardenia   HPI: [name] is a 58 y.o. male with HFrEF (dx 2015) w/ LHC demonstrating normal coronary anatomy s/p primary prevention ICD, OSA unable to tolerate CPAP, CKDIIIB & T2DM.   In 2017 he had a CPX with w/ moderate functional impairment, peak VO2 of 15, VE/VCO2 of 35. From a functional standpoint, [name] reports running 2-3 miles daily until he was diagnosed with 'long covid. Since that time he has seen a sharp decline in exercise capacity.   In November 2023, he was admitted for VT believed to be secondary to significant electrolyte derrangements from and underlying right gluteal abscess. Throughout his admission he had recurrent device shocks for VT. He underwent RHC/LHC that demonstrated normal coronary anatomy with RHC showing normal cardiac output and low filling pressures. He subsequently underwent placement of right atrial lead with improvement in ectopy after atrial pacing. TTE during admission with LVEF of 30-35%.   Echo 3/24 EF 20-25%, moderate LVH, RV okay.    Admitted 10/24 with groin cellulitis. Treated with IV antibiotics. He is now off Farxiga .   Readmitted 11/24 with extensive left upper extremity DVT. He was started on Eliquis . Echo 11/24: EF 25-30%.   Echo 3/25: EF up to 40-45%, RV okay   Seen in ED 09/07/23 with RSV infection. A week later given course of doxycycline  d/t persistent symptoms.  Admitted 5/25 with AKI 2/2 decrease oral intake. SCr peaked at 4.6 (blaseline 2.7). GDMT held and received IVF. He was restarted on Toprol  and torsemide , discharged home, weight 279 lbs.  He was readmitted 07/12-07/29/25 with acute on chronic CHF, RLE DVT, gout, left-sided chest pain, left shoulder/back pain d/t osteoarthritis/possible gout. Cardiology consulted for acute on  chronic CHF. He was diuresed. GDMT limited by CKD. Chest pain felt to be most likely musculoskeletal in origin. Echo during admit with LVEF 40-45%, moderate LVH, RV okay.  He is here today for hospital follow-up. Has chronic shortness of breath with exertion which has been stable. Gets winded with light activity which he attributes to his COPD. Home weight stable 278 lb. Has chronic orthopnea. No lower extremity edema. Main complaint is ongoing left shoulder pain. Advil is the only med that provides relief. Has been taking 3 tablets a day the last 2 days.    Current Outpatient Medications  Medication Sig Dispense Refill   acetaminophen  (TYLENOL ) 500 MG tablet Take 1,500 mg by mouth every 8 (eight) hours as needed for moderate pain (pain score 4-6).     amiodarone  (PACERONE ) 200 MG tablet Take 1 tablet by mouth once daily 30 tablet 0   atorvastatin  (LIPITOR) 10 MG tablet Take 1 tablet (10 mg total) by mouth daily. 90 tablet 2   benzonatate  (TESSALON ) 200 MG capsule Take 200 mg by mouth 2 (two) times daily as needed for cough.     cetirizine  (EQ ALLERGY RELIEF, CETIRIZINE ,) 10 MG tablet Take 1 tablet (10 mg total) by mouth daily. 90 tablet 1   cholecalciferol  (VITAMIN D3) 25 MCG (1000 UNIT) tablet Take 1,000 Units by mouth in the morning.     [START ON 02/18/2024] clonazePAM  (KLONOPIN ) 0.5 MG tablet Take 1 tablet (0.5 mg total) by mouth 2 (two) times daily as needed for anxiety. 60 tablet 1   Clotrimazole  1 % OINT Apply to the affected area twice a day 56.7  g 0   colchicine  0.6 MG tablet 1/2 tab PO Q Mon/Wed/Fri 30 tablet 1   EPINEPHrine  0.3 mg/0.3 mL IJ SOAJ injection Inject 0.3 mg into the muscle as needed. 1 each 1   fluticasone  (FLONASE ) 50 MCG/ACT nasal spray Place 2 sprays into both nostrils daily. 16 g 6   gabapentin  (NEURONTIN ) 300 MG capsule Take 1 capsule (300 mg total) by mouth at bedtime. 90 capsule 1   insulin  aspart (NOVOLOG  FLEXPEN) 100 UNIT/ML FlexPen Inject 10-20 Units into the skin  3 (three) times daily before meals. 60 mL 3   insulin  glargine (LANTUS  SOLOSTAR) 100 UNIT/ML Solostar Pen Inject 30 Units into the skin daily.     Insulin  Pen Needle (TECHLITE PEN NEEDLES) 32G X 4 MM MISC Use to inject insulin  into the skin 3 times per day. 100 each 0   loratadine  (CLARITIN ) 10 MG tablet Take 1 tablet (10 mg total) by mouth daily. 30 tablet 11   methocarbamol  (ROBAXIN ) 500 MG tablet Take 1 tablet (500 mg total) by mouth daily. 90 tablet 1   metolazone  (ZAROXOLYN ) 5 MG tablet Take 1 tablet (5 mg total) by mouth once a week. 12 tablet 2   metoprolol  succinate (TOPROL -XL) 25 MG 24 hr tablet Take 0.5 tablets (12.5 mg total) by mouth daily. 45 tablet 3   nystatin  (MYCOSTATIN /NYSTOP ) powder Apply 1 Application topically 3 (three) times daily. 60 g 2   pantoprazole  (PROTONIX ) 40 MG tablet Take 1 tablet (40 mg total) by mouth daily. 30 tablet 0   Polyethyl Glycol-Propyl Glycol (SYSTANE) 0.4-0.3 % SOLN Place 1 drop into both eyes daily as needed (dry eyes).     potassium chloride  SA (KLOR-CON  M) 20 MEQ tablet Take 2 tablets (40 mEq total) by mouth daily. 180 tablet 2   rivaroxaban  (XARELTO ) 20 MG TABS tablet Take 1 tablet (20 mg total) by mouth daily with supper. 30 tablet 2   RIVAROXABAN  (XARELTO ) VTE STARTER PACK (15 & 20 MG) Follow package directions: Take one 15mg  tablet by mouth twice a day. On day 22, switch to one 20 mg tablet once a day. Take with food. 51 each 0   sertraline  (ZOLOFT ) 50 MG tablet Take 1.5 tablets (75 mg total) by mouth in the morning. 45 tablet 2   SYMBICORT  160-4.5 MCG/ACT inhaler Inhale 2 puffs into the lungs daily.     tamsulosin  (FLOMAX ) 0.4 MG CAPS capsule Take 2 capsules (0.8 mg total) by mouth daily. 180 capsule 3   timolol  (TIMOPTIC ) 0.5 % ophthalmic solution Place 1 drop into both eyes every morning.     torsemide  (DEMADEX ) 20 MG tablet Take 2 tablets (40 mg total) by mouth daily. 180 tablet 3   No current facility-administered medications for this  encounter.   Wt Readings from Last 3 Encounters:  01/18/24 128.7 kg (283 lb 12.8 oz)  01/05/24 130 kg (286 lb 9.6 oz)  12/15/23 131.2 kg (289 lb 3.2 oz)   BP 126/80   Pulse 67   Ht 6' (1.829 m)   Wt 128.7 kg (283 lb 12.8 oz)   SpO2 96%   BMI 38.49 kg/m    PHYSICAL EXAM: General:  Fatigued appearing Cor: JVP difficult d/t thick neck. Regular rate & rhythm. No murmurs. Lungs: clear Abdomen: obese, soft, nontender, nondistended.  Extremities: no edema Neuro: alert & orientedx3. Affect pleasant    DATA REVIEW Device interrogation (personally reviewed):   ECG: 10/23/23: NSR 74 bpm (personally reviewed)  ECHO: 12/22/23: EF 40-45%, moderate LVH, RV okay  09/03/23: EF 40-45%, moderate LVH, normal RV 04/26/23 LVEF 25%-30%, normal RV function.  08/22/2022: LVEF 35%, normal RV function 05/06/2022: LVEF 30-35%, normal RV function.  02/16/19: LVEF 45-50%, normal RV function.   CATH: 05/12/22: HEMODYNAMICS: RA:                  1 mmHg (mean) RV:                  22/1-3 mmHg PA:                  23/6 mmHg (12 mean) PCWP:            4 mmHg (mean)                                      Estimated Fick CO/CI   7 L/min, 2.9 L/min/m2                                                 TPG                 8  mmHg                                              PVR                 ~1 Wood Units  PAPi                >5       IMPRESSION: Low pre and post capillary filling pressures.  Normal cardiac output/cardiac index.  Normal PVR & PA mean No obstructive CAD, left dominant w/ large Lcx supplying multiple large marginals.   ICD interrogation: Thoracic impedance trending above threshold first week of August, below threshold X 2 days. No VT/VF, no AT/AF, 1.5% V paced  ASSESSMENT & PLAN:  Heart failure with reduced EF>>now HFmrEF - Nonischemic as demonstrated by coronary angiography 12/23; no family history. If he continues to have recurrent VT will consider evaluation for sarcoidosis. Device  interrogation w/o arrhythmias - NYHA IIIb. Suspect deconditioning and COPD playing a role. - Volume okay on exam. CorVue suggests mild volume overload. Takes 40 mg Torsemide  daily. Take 40 BID X 2 days - Off Entresto  and spiro with CKD - Off SGLT2i w/ groin cellulitis - Continue Toprol  XL 12.5 mg daily - Hold off on imdur/hydralazine . Notes home SBP averages 110s. Need to avoid hypotension with CKD IV.  2. VT - Continue amiodarone  200 mg daily.  - Check LFTs and TSH today - Will need regular eye exam while on amio - No recent episodes on device   3. DM II - Uncontrolled, last A1c 8.5 - Avoid SGLT2i with hx of groin infections - Per PCP  4. CKD IV  - Baseline SCr 2.5-2.7 - labs today - recommended he stop using NSAIDs - needs to reschedule follow-up with Nephrology. Missed appointment d/t hospitalization   5. Hx DVT - Recurrent - Recently found to have RLE DVT while on Eliquis  - Now on Xarelto  20 mg daily.  - has f/u with hematology  6. OSA - severe by sleep  study - just received new machine. Unable to tolerate  7. COPD - Follows with Dr. Meade - On Symbicort   Follow up 4 months with Dr. Gardenia [name], PA-C 01/18/24

## 2024-01-18 NOTE — Progress Notes (Signed)
 Remote ICD transmission.

## 2024-01-19 ENCOUNTER — Ambulatory Visit (HOSPITAL_COMMUNITY): Payer: Self-pay | Admitting: Physician Assistant

## 2024-01-19 DIAGNOSIS — I5022 Chronic systolic (congestive) heart failure: Secondary | ICD-10-CM

## 2024-01-19 MED ORDER — POTASSIUM CHLORIDE CRYS ER 20 MEQ PO TBCR: 60.0000 meq | EXTENDED_RELEASE_TABLET | Freq: Every day | ORAL | 6 refills | Status: DC

## 2024-01-21 DIAGNOSIS — H00024 Hordeolum internum left upper eyelid: Secondary | ICD-10-CM | POA: Diagnosis not present

## 2024-01-21 DIAGNOSIS — H40023 Open angle with borderline findings, high risk, bilateral: Secondary | ICD-10-CM | POA: Diagnosis not present

## 2024-01-28 ENCOUNTER — Other Ambulatory Visit (HOSPITAL_COMMUNITY)

## 2024-01-31 DIAGNOSIS — G4733 Obstructive sleep apnea (adult) (pediatric): Secondary | ICD-10-CM | POA: Diagnosis not present

## 2024-02-01 ENCOUNTER — Encounter (INDEPENDENT_AMBULATORY_CARE_PROVIDER_SITE_OTHER)

## 2024-02-01 ENCOUNTER — Ambulatory Visit (HOSPITAL_COMMUNITY)
Admission: RE | Admit: 2024-02-01 | Discharge: 2024-02-01 | Disposition: A | Discharge status: Home / Self Care | Source: Ambulatory Visit | Attending: Cardiology | Admitting: Cardiology

## 2024-02-01 DIAGNOSIS — I5022 Chronic systolic (congestive) heart failure: Secondary | ICD-10-CM | POA: Diagnosis not present

## 2024-02-01 DIAGNOSIS — Z95810 Presence of automatic (implantable) cardiac defibrillator: Secondary | ICD-10-CM

## 2024-02-01 LAB — BASIC METABOLIC PANEL WITH GFR
Anion gap: 16 — ABNORMAL HIGH (ref 5–15)
BUN: 31 mg/dL — ABNORMAL HIGH (ref 6–20)
CO2: 28 mmol/L (ref 22–32)
Calcium: 8.9 mg/dL (ref 8.9–10.3)
Chloride: 98 mmol/L (ref 98–111)
Creatinine, Ser: 3.09 mg/dL — ABNORMAL HIGH (ref 0.61–1.24)
GFR, Estimated: 22 mL/min — ABNORMAL LOW (ref >60)
Glucose, Bld: 188 mg/dL — ABNORMAL HIGH (ref 70–99)
Potassium: 3.6 mmol/L (ref 3.5–5.1)
Sodium: 142 mmol/L (ref 135–145)

## 2024-02-02 ENCOUNTER — Ambulatory Visit (HOSPITAL_COMMUNITY): Payer: Self-pay | Admitting: Physician Assistant

## 2024-02-02 ENCOUNTER — Telehealth: Payer: Self-pay | Admitting: Internal Medicine

## 2024-02-02 NOTE — Progress Notes (Unsigned)
 EPIC Encounter for ICM Monitoring  Patient Name: [name] is a 58 y.o. male Date: 02/02/2024 Primary Care Physican: [name] Barnie NOVAK, MD Primary Cardiologist: [name] Electrophysiologist: [name] 08/24/2023 Office Weight: 295 lbs    09/28/2023 Weight: 291 lbs       10/06/2023 Weight: unknown lbs   10/11/2023 Weight: 279.5 lbs hospital weight 10/12/2023 Weight: 279.9 lbs hospital weight 10/13/2023  Weight: 280.2 lbs discharge weight 10/20/2023 Weight:  Not weighing at home    10/23/2023 Weight: Pt reports 5/27 he thinks he was 285 lbs at home 11/03/2023 Weight: 294 lbs (10 lb weight gain since 5/16) 12/15/2023 Office Weight: 289 lbs 01/02/2024 Hospital Weight: 285.5 lbs 01/13/2024 Weight: 277 lbs 02/02/2024 Weight: 280 lbs (was 274 lbs)   Spoke with patient and heart failure questions reviewed.  Transmission results reviewed.  Pt reports weight gain of about 5 lbs over the last few days with swelling of feet and knees       Corvue thoracic impedance suggesting possible fluid accumulation starting 8/23.  Also suggesting possible fluid accumulation from 8/17-8/21.   Prescribed dosage:  Torsemide  20 mg take 2 tablet(s) (40 mg total) by mouth daily.  Potassium 20 mEq take 3 tablet(s) (60 mEq total) by mouth daily. (Increased 8/12 from 40 to 60 mEq) Metolazone  5 mg take 1 tablet by mouth once a week.  Pt takes on Mondays (Nephrologist said he could take it up to twice a week).     Labs: 02/01/2024 Creatinine 3.09, BUN 31, Potassium 3.6, Sodium 142, GFR 22  01/18/2024 Creatinine 3.09, BUN 37, Potassium 3.2, Sodium 138, GFR 22 (Potassium dosage increased to 60 mEq daily by HF clinic) 01/04/2024 Creatinine 2.68, BUN 57, Potassium 4.3, Sodium 137, GFR 27  01/03/2024 Creatinine 2.44, BUN 58, Potassium 3.4, Sodium 142, GFR 30  01/01/2024 Creatinine 2.31, BUN 58, Potassium 3.7, Sodium 140, GFR 32  12/31/2023 Creatinine 2.46, BUN 60, Potassium 4.4, Sodium 138, GFR 29 12/30/2023 Creatinine  2.63, BUN 55, Potassium 4.2, Sodium 135, GFR 27  12/29/2023 Creatinine 2.81, BUN 51, Potassium 4.0, Sodium 134, GFR 25  12/28/2023 Creatinine 2.68, BUN 52, Potassium 3.4, Sodium 140, GFR 27  12/27/2023 Creatinine 2.69, BUN 54, Potassium 3.7, Sodium 136, GFR 26  12/26/2023 Creatinine 2.61, BUN 57, Potassium 3.3, Sodium 138, GFR 27  12/25/2023 Creatinine 2.57, BUN 57, Potassium 3.3, Sodium 138, GFR 28 12/24/2023 Creatinine 2.49, BUN 56, Potassium 3.8, Sodium 137, GFR 29  12/23/2023 Creatinine 2.66, BUN 65, Potassium 3.4, Sodium 138, GFR 27  12/22/2023 Creatinine 2.84, BUN 64, Potassium 3.7, Sodium 135, GFR 25  12/21/2023 Creatinine 3.00, BUN 62, Potassium 3.5, Sodium 134, GFR 23  12/19/2023 Creatinine 3.27, BUN 38, Potassium 3.0, Sodium 137, GFR 21 A complete set of results can be found in Results Review.   Recommendations:   Weekly Metolazone  taken 8/25 with minimal change in CorVue or Fluid Sx.  Confirmed he is taking correct dosages of Torsemide  & Potassium.  Copy sent to [name] Dutch, [name] since she saw pt on 8/11.   Follow-up plan: ICM clinic phone appointment on 02/09/2024 to recheck fluid levels.   91 day device clinic remote transmission 02/17/2024.     EP/Cardiology Office Visits:   Recall 05/17/2024 with [name] (4 month f/u).   Recall 11/02/2023 with [name] [name] (6 month).     Copy of ICM check sent to [name] [name].   3 month ICM trend: 02/02/2024.    12-14 Month ICM trend:     [name]  GORMAN Garner, RN 02/02/2024 4:23 PM

## 2024-02-02 NOTE — Telephone Encounter (Signed)
 Called patient, no answer. Left voicemail confirming upcoming appointment on 02/05/2024. Provided callback number for any questions or changes.

## 2024-02-02 NOTE — Progress Notes (Unsigned)
  Received: Today Colletta Manuelita Garre, PA-C  Zevin Nevares, Mitzie RAMAN, RN Hi! Thanks for the update. I instructed patient to take an extra metolazone  tomorrow with an extra 40 mEq potassium. Can you get another interrogation in 1 week? He's aware you may be in touch. Manuelita

## 2024-02-05 ENCOUNTER — Ambulatory Visit: Payer: Self-pay | Attending: Internal Medicine | Admitting: Internal Medicine

## 2024-02-05 VITALS — BP 99/64 | HR 97 | Temp 97.7°F | Ht 72.0 in | Wt 285.0 lb

## 2024-02-05 DIAGNOSIS — Z794 Long term (current) use of insulin: Secondary | ICD-10-CM | POA: Diagnosis not present

## 2024-02-05 DIAGNOSIS — Z7901 Long term (current) use of anticoagulants: Secondary | ICD-10-CM

## 2024-02-05 DIAGNOSIS — M545 Low back pain, unspecified: Secondary | ICD-10-CM | POA: Diagnosis not present

## 2024-02-05 DIAGNOSIS — F331 Major depressive disorder, recurrent, moderate: Secondary | ICD-10-CM

## 2024-02-05 DIAGNOSIS — F411 Generalized anxiety disorder: Secondary | ICD-10-CM | POA: Diagnosis not present

## 2024-02-05 DIAGNOSIS — G252 Other specified forms of tremor: Secondary | ICD-10-CM

## 2024-02-05 DIAGNOSIS — Z2821 Immunization not carried out because of patient refusal: Secondary | ICD-10-CM

## 2024-02-05 DIAGNOSIS — I5022 Chronic systolic (congestive) heart failure: Secondary | ICD-10-CM | POA: Diagnosis not present

## 2024-02-05 DIAGNOSIS — N184 Chronic kidney disease, stage 4 (severe): Secondary | ICD-10-CM

## 2024-02-05 DIAGNOSIS — G8929 Other chronic pain: Secondary | ICD-10-CM | POA: Diagnosis not present

## 2024-02-05 DIAGNOSIS — I824Z1 Acute embolism and thrombosis of unspecified deep veins of right distal lower extremity: Secondary | ICD-10-CM | POA: Diagnosis not present

## 2024-02-05 DIAGNOSIS — E1169 Type 2 diabetes mellitus with other specified complication: Secondary | ICD-10-CM

## 2024-02-05 MED ORDER — RIVAROXABAN 20 MG PO TABS: 20.0000 mg | ORAL_TABLET | Freq: Every day | ORAL | 2 refills | Status: AC

## 2024-02-05 NOTE — Patient Instructions (Signed)
 VISIT SUMMARY:  During today's visit, we discussed your recent hospitalization for a blood clot in your right leg, your chronic pain management, and concerns about your medications. We also reviewed your ongoing health issues, including kidney disease, diabetes, heart function, and panic attacks.  YOUR PLAN:  -DEEP VEIN THROMBOSIS OF RIGHT LOWER EXTREMITY: You have a blood clot in your right leg, which is being treated with Xarelto . Please clarify the prescription instructions with your pharmacy and follow up with a hematologist for further evaluation.  -CHRONIC KIDNEY DISEASE, STAGE 4: Your kidney function is significantly reduced. Avoid using NSAIDs like Advil as they can worsen your condition. We will refer you to pain management for alternative pain control options.  -TYPE 2 DIABETES MELLITUS, INSULIN  DEPENDENT: Your diabetes requires insulin  for control. Continue taking Lantus  30 units daily and Novolog  10-20 units with meals. Keep working on dietary changes to improve your blood sugar levels.  -CONGESTIVE HEART FAILURE WITH REDUCED EJECTION FRACTION: Your heart's pumping ability is reduced but has improved recently. Continue with your current medications and monitor for symptoms like shortness of breath and fatigue.  -CHRONIC LOW BACK PAIN: You have ongoing lower back pain. We will refer you to [facility] for evaluation and possible epidural treatment. Avoid NSAIDs due to your kidney condition.  -HAND TREMOR, MEDICATION-INDUCED: Your hand tremors may be caused by your medication, clonazepam . We will refer you to a neurologist for further evaluation.  -PANIC ATTACKS AND INSOMNIA: You are experiencing panic attacks and sleep disturbances, possibly due to medication side effects. We will review your current medications to see if adjustments can be made to help with these issues.  INSTRUCTIONS:  Please follow up with the hematologist for your blood clot evaluation and with the  kidney specialist next month. Clarify your Xarelto  prescription with the pharmacy. We will also refer you to pain management and a neurologist for further evaluations.

## 2024-02-05 NOTE — Progress Notes (Signed)
 Patient ID: [name], male    DOB: [birth year]  MRN: [id number]  CC: Diabetes (DM & HTN f/u. /Lower back pain - requesting pain clinic referral/L knee pain radiating to feet, L shoulder - swelling /Episodes of SOB while sleeping - waking out of sleep, SOB when walking/No to flu vax)   Subjective: [name] is a 58 y.o. male who presents for chronic ds management. His concerns today include:  Patient with history of NICM, systolic CHF s/p ICD placement, DM type 2, HL, CKD stage 4,  obesity, BPH, GERD and chronic LBP secondary to spondylosis and spinal stenosis, VT storm 04/2022, OSA (declined titration study because he feels he would not tolerate CPAP), DVT LUE 04/2023.    Discussed the use of AI scribe software for clinical note transcription with the patient, who gave verbal consent to proceed.  History of Present Illness [name] is a 58 year old male who presents for chronic ds management with several concerns.  He was hospitalized from the 12th to the 19th of last month with COPD exacerbation and acute on chronic CHF.  He was found to have a new blood clot in his right peroneal vein. At the time, he was on Eliquis , which was switched to Xarelto . He completed a starter pack of Xarelto  but is concerned about a pharmacy error on the continuation prescription, which incorrectly instructed him to take eight pills in one day. He has not taken any since finishing the starter pack a few days ago and plans to address this with the pharmacy.  He experiences chronic lower back and shoulder pain, which he has been managing with Advil despite his kidney condition. Previous injections to his lower back provided temporary relief, with one lasting a few weeks and another only three days. He previously declined an epidural for pain management when it was suggested after injections provided only temporary relief, and he is hesitant about taking more pain medication due to already taking 22 pills  in the mornings and four at nights.  He has a history of GAD, PTSD and panic attacks and was treated at [facility] about five months ago due to medication side effects, including shaking hands and suicidal thoughts. He attributes the shaking to clonazepam . He continues to experience mild panic attacks and sleep disturbances, often sleeping during the day due to insomnia at night.  He expresses that his life has changed dramatically since the incident that he had in 2023 where he was hospitalized due to repeated firing of his defibrillator.  He is followed by Dr. Carvin of [facility].  Currently on clonazepam  and Zoloft .  CKD 4: He has stage four kidney disease and is scheduled to see his kidney specialist next month. He is concerned about the impact of Advil on his kidney function.  DM: He has diabetes and his insulin  regimen was adjusted during his hospital stay due to hypoglycemic episodes. He is currently taking Lantus  28 (should be 30) units and Novolog  22 units, with blood sugars showing improvement but still needing adjustment. He has reduced sugary drink consumption to once a week.  His CGM shows that over the past 1 week he has been in target range 56% of the times and high 44% of the times with no lows.  Average blood sugars in the mornings are around 166, between noon and 5 PM 187 and between 9 PM and midnight 185.  He has an upcoming appointment with his endocrinologist in  October.  He experiences shortness of breath, particularly when walking or showering. This is chronic. His EF was 25-30% in November of last year and 40-45% on a recent echocardiogram during his hospitalization. He also reports flank pain after showering, which he is unsure if it is related to his kidneys or lungs. No dysuria  He experiences hand tremors, which he believes are due to medication, specifically clonazepam . The tremors are intermittent but noticeable.    Patient Active Problem List    Diagnosis Date Noted   Acute respiratory failure with hypoxia (HCC) 12/20/2023   COPD with acute exacerbation (HCC) 12/19/2023   Acute exacerbation of CHF (congestive heart failure) (HCC) 12/19/2023   ARF (acute renal failure) (HCC) 10/08/2023   Acute gout 10/08/2023   Anemia 10/08/2023   Acute renal failure (ARF) (HCC) 10/08/2023   Hypotension 09/07/2023   Acute deep vein thrombosis (DVT) of left upper extremity (HCC) 04/24/2023   Cellulitis of perineum 04/02/2023   Heart failure (HCC) 02/28/2023   Class 3 obesity 02/28/2023   SOB (shortness of breath) 02/27/2023   Loud snoring 10/28/2022   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   Hyperlipidemia associated with type 2 diabetes mellitus (HCC) 09/18/2022   PTSD (post-traumatic stress disorder) 07/10/2022   Panic attack 06/17/2022   Passive suicidal ideations 06/17/2022   History of ventricular tachycardia 06/14/2022   Essential hypertension 06/14/2022   Stage 3b chronic kidney disease (HCC) 05/15/2022   Acute gout due to renal impairment involving left ankle 05/07/2022   Acute gout due to renal impairment involving right knee 05/07/2022   Hyponatremia 05/07/2022   Diarrhea 05/06/2022   Hyperglycemia 05/06/2022   Cellulitis of buttock 05/06/2022   Acute on chronic combined systolic and diastolic CHF (congestive heart failure) (HCC) 05/06/2022   Acute renal failure superimposed on stage 3b chronic kidney disease (HCC) 05/06/2022   Hypokalemia 05/05/2022   Rectal abscess 05/05/2022   Left buttock abscess 05/05/2022   Cardiac arrest (HCC) 05/03/2022   AKI (acute kidney injury) (HCC) 05/03/2022   VT (ventricular tachycardia) (HCC) 05/03/2022   CKD (chronic kidney disease) stage 2, GFR 60-89 ml/min 10/24/2019   Acute non-recurrent frontal sinusitis 06/27/2019   COVID-19 virus infection 06/27/2019   Glaucoma suspect 12/22/2018   Seasonal and perennial allergic rhinoconjunctivitis 10/25/2018   Anaphylaxis due to hymenoptera  venom 06/28/2018   Urticaria 05/03/2018   S/P inguinal hernia repair 03/01/2018   Unilateral inguinal hernia without obstruction or gangrene 07/24/2017   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   Hypersomnia 02/28/2016   Abnormal PFT 02/24/2016   Dyspnea 01/28/2016   Diabetic polyneuropathy associated with type 2 diabetes mellitus (HCC) 12/18/2015   Lumbar back pain 12/18/2015   Type 2 diabetes mellitus with hyperlipidemia (HCC) 09/17/2015   Left median nerve neuropathy 04/16/2015   Lesion of right median nerve at forearm 04/16/2015   ICD (implantable cardioverter-defibrillator) in place 03/28/2014   Chronic systolic congestive heart failure (HCC) 03/28/2014   Nonischemic cardiomyopathy (HCC) 11/09/2013   Syncope 10/20/2013   Chest pain 07/28/2013   RENAL CALCULUS 05/13/2009   Obesity 05/08/2009   Former heavy cigarette smoker (20-39 per day) 05/08/2009   HOARSENESS 05/08/2009   GAD (generalized anxiety disorder) 02/28/2008   ASTHMATIC BRONCHITIS, ACUTE 02/28/2008   GERD 02/28/2008   IRRITABLE BOWEL SYNDROME 02/28/2008     Current Outpatient Medications on File Prior to Visit  Medication Sig Dispense Refill   acetaminophen  (TYLENOL ) 500 MG tablet Take 1,500 mg by mouth every 8 (eight) hours as needed  for moderate pain (pain score 4-6).     amiodarone  (PACERONE ) 200 MG tablet Take 1 tablet by mouth once daily 30 tablet 0   atorvastatin  (LIPITOR) 10 MG tablet Take 1 tablet (10 mg total) by mouth daily. 90 tablet 2   benzonatate  (TESSALON ) 200 MG capsule Take 200 mg by mouth 2 (two) times daily as needed for cough.     cetirizine  (EQ ALLERGY RELIEF, CETIRIZINE ,) 10 MG tablet Take 1 tablet (10 mg total) by mouth daily. 90 tablet 1   cholecalciferol  (VITAMIN D3) 25 MCG (1000 UNIT) tablet Take 1,000 Units by mouth in the morning.     [START ON 02/18/2024] clonazePAM  (KLONOPIN ) 0.5 MG tablet Take 1 tablet (0.5 mg total) by mouth 2 (two) times daily as needed for  anxiety. 60 tablet 1   Clotrimazole  1 % OINT Apply to the affected area twice a day 56.7 g 0   colchicine  0.6 MG tablet 1/2 tab PO Q Mon/Wed/Fri 30 tablet 1   EPINEPHrine  0.3 mg/0.3 mL IJ SOAJ injection Inject 0.3 mg into the muscle as needed. 1 each 1   fluticasone  (FLONASE ) 50 MCG/ACT nasal spray Place 2 sprays into both nostrils daily. 16 g 6   gabapentin  (NEURONTIN ) 300 MG capsule Take 1 capsule (300 mg total) by mouth at bedtime. 90 capsule 1   insulin  aspart (NOVOLOG  FLEXPEN) 100 UNIT/ML FlexPen Inject 10-20 Units into the skin 3 (three) times daily before meals. 60 mL 3   insulin  glargine (LANTUS  SOLOSTAR) 100 UNIT/ML Solostar Pen Inject 30 Units into the skin daily.     Insulin  Pen Needle (TECHLITE PEN NEEDLES) 32G X 4 MM MISC Use to inject insulin  into the skin 3 times per day. 100 each 0   loratadine  (CLARITIN ) 10 MG tablet Take 1 tablet (10 mg total) by mouth daily. 30 tablet 11   methocarbamol  (ROBAXIN ) 500 MG tablet Take 1 tablet (500 mg total) by mouth daily. 90 tablet 1   metolazone  (ZAROXOLYN ) 5 MG tablet Take 1 tablet (5 mg total) by mouth once a week. 12 tablet 2   metoprolol  succinate (TOPROL -XL) 25 MG 24 hr tablet Take 0.5 tablets (12.5 mg total) by mouth daily. 45 tablet 3   nystatin  (MYCOSTATIN /NYSTOP ) powder Apply 1 Application topically 3 (three) times daily. 60 g 2   pantoprazole  (PROTONIX ) 40 MG tablet Take 1 tablet (40 mg total) by mouth daily. 30 tablet 0   Polyethyl Glycol-Propyl Glycol (SYSTANE) 0.4-0.3 % SOLN Place 1 drop into both eyes daily as needed (dry eyes).     potassium chloride  SA (KLOR-CON  M) 20 MEQ tablet Take 3 tablets (60 mEq total) by mouth daily. 90 tablet 6   rivaroxaban  (XARELTO ) 20 MG TABS tablet Take 1 tablet (20 mg total) by mouth daily with supper. 30 tablet 2   RIVAROXABAN  (XARELTO ) VTE STARTER PACK (15 & 20 MG) Follow package directions: Take one 15mg  tablet by mouth twice a day. On day 22, switch to one 20 mg tablet once a day. Take with food.  51 each 0   sertraline  (ZOLOFT ) 50 MG tablet Take 1.5 tablets (75 mg total) by mouth in the morning. 45 tablet 2   SYMBICORT  160-4.5 MCG/ACT inhaler Inhale 2 puffs into the lungs daily.     tamsulosin  (FLOMAX ) 0.4 MG CAPS capsule Take 2 capsules (0.8 mg total) by mouth daily. 180 capsule 3   timolol  (TIMOPTIC ) 0.5 % ophthalmic solution Place 1 drop into both eyes every morning.     torsemide  (DEMADEX ) 20  MG tablet Take 2 tablets (40 mg total) by mouth daily. 180 tablet 3   No current facility-administered medications on file prior to visit.    Allergies  Allergen Reactions   Bee Venom Anaphylaxis   Trulicity  [Dulaglutide ] Anaphylaxis and Diarrhea    Extra dose of Trulicity  caused cardiac arrest. Patient experience onset of diarrhea with using this medication - cleared after med stopped.    Social History   Socioeconomic History   Marital status: Single    Spouse name: Not on file   Number of children: Not on file   Years of education: Not on file   Highest education level: Not on file  Occupational History   Occupation: Disabled  Tobacco Use   Smoking status: Former    Current packs/day: 0.00    Average packs/day: 1 pack/day for 28.0 years (28.0 ttl pk-yrs)    Types: Cigarettes    Start date: 06/03/1986    Quit date: 06/03/2014    Years since quitting: 9.6   Smokeless tobacco: Never  Vaping Use   Vaping status: Never Used  Substance and Sexual Activity   Alcohol use: Not Currently   Drug use: Yes    Types: Marijuana    Comment: occasionally;   Sexual activity: Yes  Other Topics Concern   Not on file  Social History Narrative   Pt lives with his brother    Social Drivers of Health   Financial Resource Strain: Low Risk  (05/25/2023)   Overall Financial Resource Strain (CARDIA)    Difficulty of Paying Living Expenses: Not hard at all  Recent Concern: Financial Resource Strain - High Risk (03/24/2023)   Overall Financial Resource Strain (CARDIA)    Difficulty of  Paying Living Expenses: Hard  Food Insecurity: No Food Insecurity (12/20/2023)   Hunger Vital Sign    Worried About Running Out of Food in the Last Year: Never true    Ran Out of Food in the Last Year: Never true  Transportation Needs: No Transportation Needs (12/20/2023)   PRAPARE - Administrator, Civil Service (Medical): No    Lack of Transportation (Non-Medical): No  Physical Activity: Inactive (05/25/2023)   Exercise Vital Sign    Days of Exercise per Week: 0 days    Minutes of Exercise per Session: 0 min  Stress: Stress Concern Present (05/25/2023)   [employer] of Occupational Health - Occupational Stress Questionnaire    Feeling of Stress : Rather much  Social Connections: Moderately Integrated (10/08/2023)   Social Connection and Isolation Panel    Frequency of Communication with Friends and Family: More than three times a week    Frequency of Social Gatherings with Friends and Family: More than three times a week    Attends Religious Services: More than 4 times per year    Active Member of [employer] or Organizations: Yes    Attends Banker Meetings: 1 to 4 times per year    Marital Status: Never married  Intimate Partner Violence: Not At Risk (12/20/2023)   Humiliation, Afraid, Rape, and Kick questionnaire    Fear of Current or Ex-Partner: No    Emotionally Abused: No    Physically Abused: No    Sexually Abused: No    Family History  Problem Relation Age of Onset   Diabetes Father    Arthritis Father    Hypertension Father    Diabetes Mother    Arthritis Mother    Hypertension Mother    Hypertension Brother  Past Surgical History:  Procedure Laterality Date   CARDIAC CATHETERIZATION  08/10/13   CHOLECYSTECTOMY  ~ 2010   IMPLANTABLE CARDIOVERTER DEFIBRILLATOR IMPLANT  12/23/2013   STJ Fortify ICD implanted by Dr Kelsie for cardiomyopathy and syncope   IMPLANTABLE CARDIOVERTER DEFIBRILLATOR IMPLANT N/A 12/23/2013   Procedure: IMPLANTABLE  CARDIOVERTER DEFIBRILLATOR IMPLANT;  Surgeon: Lynwood JONETTA Kelsie, MD;  Location: [facility] CATH LAB;  Service: Cardiovascular;  Laterality: N/A;   INGUINAL HERNIA REPAIR Left 03/01/2018   Procedure: OPEN REPAIR OF LEFT INGUINAL HERNIA WITH MESH;  Surgeon: Tanda Locus, MD;  Location: [facility] ORS;  Service: General;  Laterality: Left;   LEAD REVISION/REPAIR N/A 05/15/2022   Procedure: LEAD REVISION/REPAIR;  Surgeon: [name] Ole DASEN, MD;  Location: [facility] INVASIVE CV LAB;  Service: Cardiovascular;  Laterality: N/A;   LEFT HEART CATHETERIZATION WITH CORONARY ANGIOGRAM N/A 08/10/2013   Procedure: LEFT HEART CATHETERIZATION WITH CORONARY ANGIOGRAM;  Surgeon: Candyce GORMAN Reek, MD;  Location: [facility] CATH LAB;  Service: Cardiovascular;  Laterality: N/A;   ORIF HUMERUS FRACTURE Left 08/12/2013   Procedure: OPEN REDUCTION INTERNAL FIXATION (ORIF) HUMERAL SHAFT FRACTURE;  Surgeon: Jerona LULLA Sage, MD;  Location: [facility] OR;  Service: Orthopedics;  Laterality: Left;  Open Reduction Internal Fixation Left Humerus   RIGHT/LEFT HEART CATH AND CORONARY ANGIOGRAPHY N/A 05/12/2022   Procedure: RIGHT/LEFT HEART CATH AND CORONARY ANGIOGRAPHY;  Surgeon: Gardenia Led, DO;  Location: [facility] INVASIVE CV LAB;  Service: Cardiovascular;  Laterality: N/A;   URETEROSCOPY     laser for kidney stones    ROS: Review of Systems Negative except as stated above  PHYSICAL EXAM: BP 99/64 (BP Location: Left Arm, Patient Position: Sitting, Cuff Size: Large)   Pulse 97   Temp 97.7 F (36.5 C) (Oral)   Ht 6' (1.829 m)   Wt 285 lb (129.3 kg)   SpO2 92%   BMI 38.65 kg/m   Physical Exam   General appearance - alert, well appearing, and in no distress when sitting. Appears tired and gets slightly winded easily when transferring to exam table Mental status - pt expresses sadness when talking about his health issues Neck - supple, no significant adenopathy Chest - breath sounds decrease but clear Heart - RRR Musculoskeletal - no tenderness on palpation  of the lumbosacral spine or surrounding paraspinal muscles.  No tenderness on palpation of the flank on either side. Extremities -no lower extremity edema Neuro: pt with intermittent coarse tremors both hands at rest, LT>RT    02/05/2024    4:24 PM 08/07/2023    3:26 PM 05/25/2023    3:53 PM  Depression screen PHQ 2/9  Decreased Interest 1 0 1  Down, Depressed, Hopeless 1 0 1  PHQ - 2 Score 2 0 2  Altered sleeping 3 0 1  Tired, decreased energy 1 0 1  Change in appetite 1 0 1  Feeling bad or failure about yourself  0 0 0  Trouble concentrating 1 0 0  Moving slowly or fidgety/restless 1 0 0  Suicidal thoughts 3 0 0  PHQ-9 Score 12 0 5  Difficult doing work/chores Somewhat difficult  Somewhat difficult       Latest Ref Rng & Units 02/01/2024    3:49 PM 01/18/2024    3:58 PM 01/04/2024   11:57 AM  CMP  Glucose 70 - 99 mg/dL 811  708  772   BUN 6 - 20 mg/dL 31  37  57   Creatinine 0.61 - 1.24 mg/dL 6.90  6.90  7.31  Sodium 135 - 145 mmol/L 142  138  137   Potassium 3.5 - 5.1 mmol/L 3.6  3.2  4.3   Chloride 98 - 111 mmol/L 98  97  98   CO2 22 - 32 mmol/L 28  26  26    Calcium  8.9 - 10.3 mg/dL 8.9  9.1  9.2   Total Protein 6.5 - 8.1 g/dL  7.3    Total Bilirubin 0.0 - 1.2 mg/dL  0.8    Alkaline Phos 38 - 126 U/L  59    AST 15 - 41 U/L  24    ALT 0 - 44 U/L  31     Lipid Panel     Component Value Date/Time   CHOL 166 02/13/2023 1658   TRIG 283 (H) 02/13/2023 1658   HDL 44 02/13/2023 1658   CHOLHDL 3.8 02/13/2023 1658   CHOLHDL 4 05/08/2009 1040   VLDL 27.0 05/08/2009 1040   LDLCALC 76 02/13/2023 1658    CBC    Component Value Date/Time   WBC 10.7 (H) 01/04/2024 1157   RBC 4.53 01/04/2024 1157   HGB 13.7 01/04/2024 1157   HGB 14.0 08/26/2021 1608   HCT 42.1 01/04/2024 1157   HCT 40.9 08/26/2021 1608   PLT 317 01/04/2024 1157   PLT 286 08/26/2021 1608   MCV 92.9 01/04/2024 1157   MCV 88 08/26/2021 1608   MCH 30.2 01/04/2024 1157   MCHC 32.5 01/04/2024 1157   RDW  15.1 01/04/2024 1157   RDW 13.0 08/26/2021 1608   LYMPHSABS 1.5 01/04/2024 1157   LYMPHSABS 1.7 06/28/2018 1440   MONOABS 0.3 01/04/2024 1157   EOSABS 0.0 01/04/2024 1157   EOSABS 0.3 06/28/2018 1440   BASOSABS 0.0 01/04/2024 1157   BASOSABS 0.0 06/28/2018 1440    ASSESSMENT AND PLAN: 1. Acute deep vein thrombosis (DVT) of distal vein of right lower extremity (HCC) (Primary) This is the second episode of blood clot that he has had within the year.  Will likely need lifelong anticoagulation.  Keep upcoming appointment with his hematologist Dr. Loretha - rivaroxaban  (XARELTO ) 20 MG TABS tablet; Take 1 tablet (20 mg total) by mouth daily with supper.  Dispense: 30 tablet; Refill: 2  2. Chronic midline low back pain without sciatica Recommend against using Advil or other NSAIDs as these can really push him over into end-stage renal disease.  He is agreeable to referral to pain management for his back. - Ambulatory referral to Pain Clinic  3. CKD (chronic kidney disease) stage 4, GFR 15-29 ml/min (HCC) Recommend against using Advil or other NSAIDs as these can really push him over into end-stage renal disease.  He is agreeable to referral to pain management for his back.  4. Coarse tremors Patient thinks it is medication related but will refer him to neurology to evaluate further.  May want to discuss with his psychiatrist about the clonazepam  if he feels that the medication is causing or contributing to the tremors. - Ambulatory referral to Neurology  5. Type 2 diabetes mellitus with other specified complication, with long-term current use of insulin  (HCC) Blood sugars not at goal but improving.  Commended him for significantly decreasing consumption of sugary drinks.  Recommend increasing the Lantus  to 30 units daily as intended.  Continue the NovoLog  10 to 20 units with meals.  Keep appointment with endocrinology.  6. Chronic systolic congestive heart failure (HCC) Patient with chronic  dyspnea on exertion but EF has improved on recent echo.  Continue metoprolol  12.5  mg daily, torsemide  40 mg daily, Xarelto  5 mg weekly  7. GAD (generalized anxiety disorder) 8. MDD moderate recurrent - Patient plugged in with behavioral health and sees his psychiatrist regularly.  9. Influenza vaccination declined   Patient was given the opportunity to ask questions.  Patient verbalized understanding of the plan and was able to repeat key elements of the plan.   This documentation was completed using Paediatric nurse.  Any transcriptional errors are unintentional.  No orders of the defined types were placed in this encounter.    Requested Prescriptions    No prescriptions requested or ordered in this encounter    No follow-ups on file.  Barnie Louder, MD, FACP

## 2024-02-06 ENCOUNTER — Encounter: Payer: Self-pay | Admitting: Internal Medicine

## 2024-02-07 ENCOUNTER — Other Ambulatory Visit: Payer: Self-pay | Admitting: Internal Medicine

## 2024-02-09 ENCOUNTER — Encounter: Attending: Cardiology

## 2024-02-09 ENCOUNTER — Encounter: Payer: Self-pay | Admitting: Internal Medicine

## 2024-02-09 DIAGNOSIS — H00024 Hordeolum internum left upper eyelid: Secondary | ICD-10-CM | POA: Diagnosis not present

## 2024-02-09 DIAGNOSIS — I5022 Chronic systolic (congestive) heart failure: Secondary | ICD-10-CM | POA: Insufficient documentation

## 2024-02-09 DIAGNOSIS — H40023 Open angle with borderline findings, high risk, bilateral: Secondary | ICD-10-CM | POA: Diagnosis not present

## 2024-02-09 DIAGNOSIS — Z95810 Presence of automatic (implantable) cardiac defibrillator: Secondary | ICD-10-CM | POA: Insufficient documentation

## 2024-02-10 ENCOUNTER — Other Ambulatory Visit: Payer: Self-pay

## 2024-02-10 NOTE — Progress Notes (Signed)
 EPIC Encounter for ICM Monitoring  Patient Name: [name] is a 58 y.o. male Date: 02/10/2024 Primary Care Physican: Vicci Barnie NOVAK, MD Primary Cardiologist: Crenshaw/[name] Electrophysiologist: [name] 08/24/2023 Office Weight: 295 lbs    09/28/2023 Weight: 291 lbs       10/13/2023  Weight: 280.2 lbs discharge weight 10/20/2023 Weight:  Not weighing at home    10/23/2023 Weight: Pt reports 5/27 he thinks he was 285 lbs at home 11/03/2023 Weight: 294 lbs (10 lb weight gain since 5/16) 12/15/2023 Office Weight: 289 lbs 01/02/2024 Hospital Weight: 285.5 lbs 01/13/2024 Weight: 277 lbs 02/02/2024 Weight: 280 lbs (was 274 lbs) 02/09/2024 Weight: 277 lbs   Spoke with patient and heart failure questions reviewed.  Transmission results reviewed.  Pt asymptomatic for fluid accumulation.  Reports feeling well at this time and voices no complaints.     Corvue thoracic impedance suggesting possible fluid accumulation starting 8/23 and returned to baseline 8/30 after taking extra Metolazone  & Potassium as instructed on 8/25 by [name], NP at HF clinic.   Prescribed dosage:  Torsemide  20 mg take 2 tablet(s) (40 mg total) by mouth daily.  Potassium 20 mEq take 3 tablet(s) (60 mEq total) by mouth daily. (Increased 8/12 from 40 to 60 mEq) Metolazone  5 mg take 1 tablet by mouth once a week.  Pt takes on Mondays (Nephrologist said he could take it up to twice a week).     Labs: 02/01/2024 Creatinine 3.09, BUN 31, Potassium 3.6, Sodium 142, GFR 22  01/18/2024 Creatinine 3.09, BUN 37, Potassium 3.2, Sodium 138, GFR 22 (Potassium dosage increased to 60 mEq daily by HF clinic) 01/04/2024 Creatinine 2.68, BUN 57, Potassium 4.3, Sodium 137, GFR 27  01/03/2024 Creatinine 2.44, BUN 58, Potassium 3.4, Sodium 142, GFR 30  01/01/2024 Creatinine 2.31, BUN 58, Potassium 3.7, Sodium 140, GFR 32  12/31/2023 Creatinine 2.46, BUN 60, Potassium 4.4, Sodium 138, GFR 29 A complete set of results can be found in Results  Review.   Recommendations:   No changes and encouraged to call if experiencing any fluid symptoms.   Follow-up plan: ICM clinic phone appointment on 03/07/2024.   91 day device clinic remote transmission 02/17/2024.     EP/Cardiology Office Visits:   Recall 05/17/2024 with Dr Gardenia (4 month f/u).   Recall 11/02/2023 with Dr [name] or [name], PA (6 month).     Copy of ICM check sent to Dr [name].   3 month ICM trend: 02/09/2024.    12-14 Month ICM trend:     [name], RN 02/10/2024 1:10 PM

## 2024-02-15 ENCOUNTER — Telehealth: Payer: Self-pay | Admitting: Physical Medicine and Rehabilitation

## 2024-02-15 DIAGNOSIS — M461 Sacroiliitis, not elsewhere classified: Secondary | ICD-10-CM

## 2024-02-15 NOTE — Telephone Encounter (Signed)
 Pt called requesting an appt/ Last injection 07/07/23. Pt phone number is [phone].

## 2024-02-16 ENCOUNTER — Ambulatory Visit (INDEPENDENT_AMBULATORY_CARE_PROVIDER_SITE_OTHER): Admitting: Primary Care

## 2024-02-16 ENCOUNTER — Other Ambulatory Visit: Payer: Self-pay | Admitting: Family Medicine

## 2024-02-16 ENCOUNTER — Encounter: Payer: Self-pay | Admitting: Primary Care

## 2024-02-16 VITALS — BP 126/62 | HR 92 | Temp 97.5°F | Ht 72.0 in | Wt 282.0 lb

## 2024-02-16 DIAGNOSIS — E1165 Type 2 diabetes mellitus with hyperglycemia: Secondary | ICD-10-CM

## 2024-02-16 DIAGNOSIS — J441 Chronic obstructive pulmonary disease with (acute) exacerbation: Secondary | ICD-10-CM | POA: Diagnosis not present

## 2024-02-16 DIAGNOSIS — J9601 Acute respiratory failure with hypoxia: Secondary | ICD-10-CM | POA: Diagnosis not present

## 2024-02-16 DIAGNOSIS — G4730 Sleep apnea, unspecified: Secondary | ICD-10-CM

## 2024-02-16 DIAGNOSIS — J4489 Other specified chronic obstructive pulmonary disease: Secondary | ICD-10-CM

## 2024-02-16 NOTE — Patient Instructions (Addendum)
  VISIT SUMMARY: You came in today to discuss the management of your severe sleep apnea. We reviewed your history, including your intolerance to CPAP therapy due to claustrophobia, and explored alternative treatment options. We also discussed your COPD management, recent DVT, and weight management.  YOUR PLAN: -SEVERE OBSTRUCTIVE SLEEP APNEA: Severe obstructive sleep apnea is a condition where your airway becomes blocked during sleep, causing breathing pauses. Since you cannot tolerate CPAP therapy, we discussed alternative treatments such as the Inspire device, which involves a surgical procedure to stimulate a nerve to keep your airway open, and an oral appliance, which is less effective for severe cases. We will refer you to an ENT specialist for further evaluation and schedule a sleep endoscopy to assess your airway anatomy. Additionally, we provided you with an information packet on the Inspire device and discussed the potential benefits of weight loss in managing your condition.  -CHRONIC OBSTRUCTIVE PULMONARY DISEASE (COPD): COPD is a chronic lung condition that makes it hard to breathe. You are currently managing it with the Symbicort  inhaler, and you should continue using it as prescribed, two puffs twice daily.  -ACUTE LEFT LOWER EXTREMITY DEEP VEIN THROMBOSIS (DVT): A DVT is a blood clot in a deep vein, usually in the legs. You were diagnosed with a DVT in your left leg in July and are currently taking Xarelto . You have a follow-up appointment with hematology scheduled for October.  -OBESITY: Obesity is a condition where you have an excessive amount of body fat. Your current weight is higher than your usual, and we discussed the potential benefits of weight loss in managing your sleep apnea. Since you had a cardiac arrest with a GLP medication, this class of medication is not an option for weight management.  INSTRUCTIONS: Please follow up with the ENT specialist (Dr. Okey or Dr. Tobie)  for evaluation for the [facility] device and schedule a sleep endoscopy to assess your airway anatomy. Continue using your Symbicort  inhaler as prescribed and attend your follow-up appointment with hematology in October. Consider weight loss strategies to help manage your sleep apnea.

## 2024-02-16 NOTE — Progress Notes (Signed)
 @Patient  ID: [name], male    DOB: [birth year], 58 y.o.   MRN: [id number]  Chief Complaint  Patient presents with   Obstructive Sleep Apnea    Not using cpap    Referring provider: Vicci Barnie NOVAK, MD  HPI: 58 year old male, former smoker.  Past medical history significant for chronic systolic heart failure, cardiac arrest, hypertension, asthmatic bronchitis, GERD, IBS, 2 diabetes, chronic kidney disease, renal hernia repair, PTSD.  Former patient of Dr. Alaine, last seen by Dr. Meade on 09/04/2022 for COPD exacerbation.   Previous [facility] pulmonary encounter:  10/09/2022 Patient presents today for follow-up. He was seen for acute office visit in March for symptoms of shortness of breath, cough, chest congestion and wheezing.  Given prescription for doxycycline .  Advised to use DuoNebs every 4 hours as needed. He had no previous airflow limitation on PFTs in 2017. Ordered for repeat pulmonary function testing. He has trouble catching his breath at times. He will need to gasp to get a full breath, this comes out of the blue at times. Associated chest tightness. Cough and congestion improved after taking doxycycline .    Asthma-COPD overlap syndrome - Patient was seen in March for acute exacerbation.  Treated with doxycycline .  Cough and congestion improved.  He continues to have intermittent dyspnea symptoms and chest tightness.  Pulmonary function testing today showed moderate obstructive airway disease with reversibility/ FEV1 58%.  We will trial patient on Trelegy 100 mcg 1 puff daily.  Follow-up in 4 weeks or sooner if needed.  10/28/2022 Patient presents today for 2-week follow-up.  Patient has persistent symptoms of intermittent dyspnea and chest tightness.  Pulmonary function testing in May showed moderate obstructive airway disease with reversibility.  He was given a trial of Trelegy but reported worsening chest tightness with use. He tolerates prednisone  all right.  He continues to  experience intermittent periods where he sporadically gasps for air. He is exhausted most days and has associated snoring symptoms. He has never had sleep study. Epworth score 12.     Assessment:  COPD Severe OSA, not on CPAP NICM with chronic HFrEF and h/o VT s/p ICD placement EF 25% History of tobacco use disorder, quit 2015   Plan/Recommendations: Upon closer review of the pfts done May 2024 - he failed to meet peak expiratory flow in one second and thus I feel his COPD is not as severe as initially thought. However he certainly has smoking related lung disease.  Has tried and failed - advair, stiolto, anoro, symbicort , spiriva , breztri. Trelegy is the only inhaler that works for him.    I have sent a BP cuff to your pharmacy. Please check your blood pressure daily and write it down to show your heart doctors. I will reach out to them about your blood pressure to see if they need to make any medication changes.    For now - Will give Continue trelegy inhaler once daily, and albuterol  as needed. Samples provided.    Please schedule your sleep study in lab. Treating severe sleep apnea will help a lot of your symptoms as well.       02/16/2024- Interim hx Discussed the use of AI scribe software for clinical note transcription with the patient, who gave verbal consent to proceed.  History of Present Illness [name] is a 58 year old male with severe sleep apnea who presents for evaluation of sleep apnea management.  He has severe sleep apnea, diagnosed via a sleep study in  March, optimal pressure determined to be 13cm h20. Time spent <=88% oxygen  saturation was 2.5 minutes (0.7%). He is unable to tolerate CPAP therapy due to severe claustrophobia, often finding the mask displaced during the night. Despite multiple attempts, he has not succeeded in using it effectively. He is interested in exploring alternative treatments for his sleep apnea.  He has a history of COPD and is currently  using Symbicort , two puffs in the morning and evening. He was hospitalized in July for chest pain, and his workup included EKG and troponins, which were normal. During this hospitalization, he was diagnosed with a new acute left leg DVT. Initially treated with Eliquis , he was switched to Lovenox  and then to Xarelto , which he is currently taking.  He also has a history of diabetes and is on insulin  therapy. He has previously experienced cardiac arrest, which he attributes to a GLP medication, and therefore, this class of medication is not an option for him.  He reports a weight of 274-276 pounds, which is higher than his usual weight of around 170 pounds. He used to run three miles a day but has not maintained this level of activity in the past ten years.  He feels tired, which he attributes to his untreated sleep apnea.   Pulmonary function testing 10/09/2022 >> FVC 3.25 (60%), FEV1 2.39 (58%), ratio 74, TLC 74%, DLCOunc 23.92 (77%)/ Moderate obstruction with positive BD response. Mild diffusion defect.    Allergies  Allergen Reactions   Bee Venom Anaphylaxis   Trulicity  [Dulaglutide ] Anaphylaxis and Diarrhea    Extra dose of Trulicity  caused cardiac arrest. Patient experience onset of diarrhea with using this medication - cleared after med stopped.    Immunization History  Administered Date(s) Administered   Moderna Sars-Covid-2 Vaccination 10/21/2019   PFIZER(Purple Top)SARS-COV-2 Vaccination 10/21/2019    Past Medical History:  Diagnosis Date   AICD (automatic cardioverter/defibrillator) present    Chronic bronchitis (HCC)    get it most q yr (12/23/2013)   Chronic systolic (congestive) heart failure (HCC)    Fracture of left humerus    a. 07/2013.   GERD (gastroesophageal reflux disease)    not at present time   Heart murmur    born w/it    History of renal calculi    Hypertension    Kidney stones    NICM (nonischemic cardiomyopathy) (HCC)    a. 07/2013 Echo: EF 20-25%,  diff HK, Gr2 DD, mild MR, mod dil LA/RA. 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    Shockable heart rhythm detected by automated external defibrillator    Syncope    Type II diabetes mellitus (HCC) 2006    Tobacco History: Social History   Tobacco Use  Smoking Status Former   Current packs/day: 0.00   Average packs/day: 1 pack/day for 28.0 years (28.0 ttl pk-yrs)   Types: Cigarettes   Start date: 06/03/1986   Quit date: 06/03/2014   Years since quitting: 9.7  Smokeless Tobacco Never   Counseling given: Not Answered   Outpatient Medications Prior to Visit  Medication Sig Dispense Refill   acetaminophen  (TYLENOL ) 500 MG tablet Take 1,500 mg by mouth every 8 (eight) hours as needed for moderate pain (pain score 4-6).     amiodarone  (PACERONE ) 200 MG tablet Take 1 tablet by mouth once daily 30 tablet 2   atorvastatin  (LIPITOR) 10 MG tablet Take 1 tablet (10 mg total) by mouth  daily. 90 tablet 2   benzonatate  (TESSALON ) 200 MG capsule Take 200 mg by mouth 2 (two) times daily as needed for cough.     cetirizine  (EQ ALLERGY RELIEF, CETIRIZINE ,) 10 MG tablet Take 1 tablet (10 mg total) by mouth daily. 90 tablet 1   cholecalciferol  (VITAMIN D3) 25 MCG (1000 UNIT) tablet Take 1,000 Units by mouth in the morning.     [START ON 02/18/2024] clonazePAM  (KLONOPIN ) 0.5 MG tablet Take 1 tablet (0.5 mg total) by mouth 2 (two) times daily as needed for anxiety. 60 tablet 1   Clotrimazole  1 % OINT Apply to the affected area twice a day 56.7 g 0   colchicine  0.6 MG tablet 1/2 tab PO Q Mon/Wed/Fri 30 tablet 1   EPINEPHrine  0.3 mg/0.3 mL IJ SOAJ injection Inject 0.3 mg into the muscle as needed. 1 each 1   fluticasone  (FLONASE ) 50 MCG/ACT nasal spray Place 2 sprays into both nostrils daily. 16 g 6   gabapentin  (NEURONTIN ) 300 MG capsule Take 1 capsule (300 mg total) by mouth at bedtime. 90 capsule 1   insulin  aspart (NOVOLOG   FLEXPEN) 100 UNIT/ML FlexPen Inject 10-20 Units into the skin 3 (three) times daily before meals. 60 mL 3   insulin  glargine (LANTUS  SOLOSTAR) 100 UNIT/ML Solostar Pen Inject 30 Units into the skin daily.     Insulin  Pen Needle (TECHLITE PEN NEEDLES) 32G X 4 MM MISC Use to inject insulin  into the skin 3 times per day. 100 each 0   loratadine  (CLARITIN ) 10 MG tablet Take 1 tablet (10 mg total) by mouth daily. 30 tablet 11   methocarbamol  (ROBAXIN ) 500 MG tablet Take 1 tablet (500 mg total) by mouth daily. 90 tablet 1   metolazone  (ZAROXOLYN ) 5 MG tablet Take 1 tablet (5 mg total) by mouth once a week. 12 tablet 2   metoprolol  succinate (TOPROL -XL) 25 MG 24 hr tablet Take 0.5 tablets (12.5 mg total) by mouth daily. 45 tablet 3   nystatin  (MYCOSTATIN /NYSTOP ) powder Apply 1 Application topically 3 (three) times daily. 60 g 2   pantoprazole  (PROTONIX ) 40 MG tablet Take 1 tablet (40 mg total) by mouth daily. 30 tablet 0   Polyethyl Glycol-Propyl Glycol (SYSTANE) 0.4-0.3 % SOLN Place 1 drop into both eyes daily as needed (dry eyes).     potassium chloride  SA (KLOR-CON  M) 20 MEQ tablet Take 3 tablets (60 mEq total) by mouth daily. 90 tablet 6   rivaroxaban  (XARELTO ) 20 MG TABS tablet Take 1 tablet (20 mg total) by mouth daily with supper. 30 tablet 2   sertraline  (ZOLOFT ) 50 MG tablet Take 1.5 tablets (75 mg total) by mouth in the morning. 45 tablet 2   SYMBICORT  160-4.5 MCG/ACT inhaler Inhale 2 puffs into the lungs daily.     tamsulosin  (FLOMAX ) 0.4 MG CAPS capsule Take 2 capsules (0.8 mg total) by mouth daily. 180 capsule 3   timolol  (TIMOPTIC ) 0.5 % ophthalmic solution Place 1 drop into both eyes every morning.     torsemide  (DEMADEX ) 20 MG tablet Take 2 tablets (40 mg total) by mouth daily. 180 tablet 3   No facility-administered medications prior to visit.   Review of Systems  Review of Systems  Constitutional:  Positive for fatigue.  Respiratory: Negative.  Negative for shortness of breath.       Physical Exam  BP 126/62   Pulse 92   Temp (!) 97.5 F (36.4 C)   Ht 6' (1.829 m)   Wt 282 lb (127.9 kg)  SpO2 98% Comment: RA  BMI 38.25 kg/m  Physical Exam Constitutional:      Appearance: Normal appearance.  HENT:     Head: Normocephalic and atraumatic.  Cardiovascular:     Rate and Rhythm: Normal rate and regular rhythm.  Pulmonary:     Effort: Pulmonary effort is normal.     Breath sounds: No wheezing, rhonchi or rales.  Skin:    General: Skin is warm and dry.  Neurological:     General: No focal deficit present.     Mental Status: He is alert and oriented to person, place, and time. Mental status is at baseline.  Psychiatric:        Mood and Affect: Mood normal.        Behavior: Behavior normal.        Thought Content: Thought content normal.        Judgment: Judgment normal.     Lab Results:  CBC    Component Value Date/Time   WBC 10.7 (H) 01/04/2024 1157   RBC 4.53 01/04/2024 1157   HGB 13.7 01/04/2024 1157   HGB 14.0 08/26/2021 1608   HCT 42.1 01/04/2024 1157   HCT 40.9 08/26/2021 1608   PLT 317 01/04/2024 1157   PLT 286 08/26/2021 1608   MCV 92.9 01/04/2024 1157   MCV 88 08/26/2021 1608   MCH 30.2 01/04/2024 1157   MCHC 32.5 01/04/2024 1157   RDW 15.1 01/04/2024 1157   RDW 13.0 08/26/2021 1608   LYMPHSABS 1.5 01/04/2024 1157   LYMPHSABS 1.7 06/28/2018 1440   MONOABS 0.3 01/04/2024 1157   EOSABS 0.0 01/04/2024 1157   EOSABS 0.3 06/28/2018 1440   BASOSABS 0.0 01/04/2024 1157   BASOSABS 0.0 06/28/2018 1440    BMET    Component Value Date/Time   NA 142 02/01/2024 1549   NA 142 02/13/2023 1658   K 3.6 02/01/2024 1549   CL 98 02/01/2024 1549   CO2 28 02/01/2024 1549   GLUCOSE 188 (H) 02/01/2024 1549   BUN 31 (H) 02/01/2024 1549   BUN 26 (H) 02/13/2023 1658   CREATININE 3.09 (H) 02/01/2024 1549   CREATININE 1.68 (H) 04/24/2016 1502   CALCIUM  8.9 02/01/2024 1549   GFRNONAA 22 (L) 02/01/2024 1549   GFRNONAA >89 12/18/2014 1455    GFRAA 53 (L) 07/17/2020 1705   GFRAA >89 12/18/2014 1455    BNP    Component Value Date/Time   BNP 23.3 01/18/2024 1558   BNP 8.3 04/24/2016 1502    ProBNP    Component Value Date/Time   PROBNP 31.0 07/23/2022 1655    Imaging: No results found.   Assessment & Plan:   1. Acute respiratory failure with hypoxia (HCC) (Primary)  2. COPD with acute exacerbation (HCC)  3. Asthma-COPD overlap syndrome (HCC)  Assessment and Plan Assessment & Plan Severe obstructive sleep apnea, CPAP intolerant Severe obstructive sleep apnea diagnosed in March with a sleep study. Intolerant to CPAP due to claustrophobia. Risks of untreated sleep apnea include stress on the heart, increased risk of AFib, stroke, diabetes, pulmonary hypertension, and potential link to Alzheimer's. - Discussed alternative treatments: Inspire device and oral appliance. - Inspire device: surgical option stimulating hypoglossal nerve to prevent obstructive events. Risks include site infection, bleeding, dysphasia, soreness, temporary tongue weakness, dry mouth, voice changes, and swallowing function issues. Requires surgical implantation and nightly use. - Oral appliance: less effective for severe cases and not typically covered by insurance. - Refer to ENT specialist (Dr. Soldatova or Dr. Tobie) for  evaluation for Inspire device. He will needs sleep endoscopy to assess airway anatomy. - Provide information packet on Inspire device. - Discuss potential for weight loss to aid in management, unable to use GLP medication  Chronic obstructive pulmonary disease (COPD) Stable; COPD managed with Symbicort  inhaler. - Continue Symbicort  160cmg, two puffs twice daily.  Acute left lower extremity deep vein thrombosis (DVT) Acute DVT in left leg diagnosed in July. Initially treated with Eliquis , then switched to Lovenox , and currently on Xarelto .  Obesity Current weight is 274-276 lbs, previously around 260 lbs. Previously  active with running. Weight loss discussed as a potential aid in managing sleep apnea. Cardiac arrest with GLP medication, so this is not an option for weight management.  Almarie LELON Ferrari, NP 02/16/2024

## 2024-02-17 ENCOUNTER — Other Ambulatory Visit: Payer: Self-pay

## 2024-02-17 ENCOUNTER — Other Ambulatory Visit: Payer: Self-pay | Admitting: Physical Medicine and Rehabilitation

## 2024-02-17 ENCOUNTER — Encounter: Payer: Self-pay | Admitting: Internal Medicine

## 2024-02-17 ENCOUNTER — Ambulatory Visit (INDEPENDENT_AMBULATORY_CARE_PROVIDER_SITE_OTHER): Admitting: Podiatry

## 2024-02-17 ENCOUNTER — Telehealth: Payer: Self-pay | Admitting: Physical Medicine and Rehabilitation

## 2024-02-17 ENCOUNTER — Ambulatory Visit: Payer: Medicaid Other

## 2024-02-17 ENCOUNTER — Other Ambulatory Visit: Payer: Self-pay | Admitting: Internal Medicine

## 2024-02-17 ENCOUNTER — Telehealth: Payer: Self-pay

## 2024-02-17 DIAGNOSIS — Z91198 Patient's noncompliance with other medical treatment and regimen for other reason: Secondary | ICD-10-CM

## 2024-02-17 DIAGNOSIS — M5416 Radiculopathy, lumbar region: Secondary | ICD-10-CM

## 2024-02-17 DIAGNOSIS — I5022 Chronic systolic (congestive) heart failure: Secondary | ICD-10-CM | POA: Diagnosis not present

## 2024-02-17 DIAGNOSIS — G894 Chronic pain syndrome: Secondary | ICD-10-CM

## 2024-02-17 MED ORDER — ACETAMINOPHEN-CODEINE 300-30 MG PO TABS: 1.0000 | ORAL_TABLET | Freq: Three times a day (TID) | ORAL | 0 refills | Status: DC | PRN

## 2024-02-17 MED ORDER — FREESTYLE LIBRE 2 PLUS SENSOR MISC: 11 refills | Status: AC

## 2024-02-17 NOTE — Telephone Encounter (Signed)
 Patient called regarding his pain and says he is having trouble walking. He says that his pain is in the low back and into the upper buttocks. I advised that a referral was faxed to pain management today, so he should get a call to schedule with them. He has been taking Advil and Tylenol . He denies using ice/heat. Patient is inquiring about anything he can do in the meantime.

## 2024-02-17 NOTE — Telephone Encounter (Signed)
 Pharmacy Patient Advocate Encounter  Received notification from [facility] MEDICA[employer] that Prior Authorization for FREESTYLE LIBRE 2 PLUS SENSOR has been APPROVED from 02/17/2024 to 02/16/2025   PA #/Case ID/Reference #: [id number]

## 2024-02-18 ENCOUNTER — Other Ambulatory Visit: Payer: Self-pay | Admitting: Physical Medicine and Rehabilitation

## 2024-02-18 DIAGNOSIS — M5416 Radiculopathy, lumbar region: Secondary | ICD-10-CM

## 2024-02-18 DIAGNOSIS — G894 Chronic pain syndrome: Secondary | ICD-10-CM

## 2024-02-18 LAB — CUP PACEART REMOTE DEVICE CHECK
Battery Remaining Longevity: 83 mo
Battery Remaining Percentage: 78 %
Battery Voltage: 3.01 V
Brady Statistic AP VP Percent: 1 %
Brady Statistic AP VS Percent: 16 %
Brady Statistic AS VP Percent: 1.1 %
Brady Statistic AS VS Percent: 82 %
Brady Statistic RA Percent Paced: 16 %
Brady Statistic RV Percent Paced: 1.5 %
Date Time Interrogation Session: 20250910020047
HighPow Impedance: 78 Ohm
Implantable Lead Connection Status: 753985
Implantable Lead Connection Status: 753985
Implantable Lead Implant Date: 20150717
Implantable Lead Implant Date: 20231207
Implantable Lead Location: 753859
Implantable Lead Location: 753860
Implantable Pulse Generator Implant Date: 20231207
Lead Channel Impedance Value: 350 Ohm
Lead Channel Impedance Value: 360 Ohm
Lead Channel Pacing Threshold Amplitude: 0.375 V
Lead Channel Pacing Threshold Amplitude: 0.75 V
Lead Channel Pacing Threshold Pulse Width: 0.5 ms
Lead Channel Pacing Threshold Pulse Width: 0.5 ms
Lead Channel Sensing Intrinsic Amplitude: 12 mV
Lead Channel Sensing Intrinsic Amplitude: 2.1 mV
Lead Channel Setting Pacing Amplitude: 1.375
Lead Channel Setting Pacing Amplitude: 2 V
Lead Channel Setting Pacing Pulse Width: 0.5 ms
Lead Channel Setting Sensing Sensitivity: 0.5 mV
Pulse Gen Serial Number: 810051574
Zone Setting Status: 755011

## 2024-02-18 MED ORDER — ACETAMINOPHEN-CODEINE 300-30 MG PO TABS: 1.0000 | ORAL_TABLET | Freq: Three times a day (TID) | ORAL | 0 refills | Status: DC | PRN

## 2024-02-21 ENCOUNTER — Ambulatory Visit: Payer: Self-pay | Admitting: Cardiology

## 2024-02-21 NOTE — Progress Notes (Signed)
 1. Failure to attend appointment with reason given    Appointment canceled by patient.
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
## 2024-02-25 ENCOUNTER — Encounter (HOSPITAL_COMMUNITY): Admitting: Psychiatry

## 2024-02-25 ENCOUNTER — Encounter (HOSPITAL_COMMUNITY): Payer: Self-pay

## 2024-02-25 ENCOUNTER — Inpatient Hospital Stay (HOSPITAL_COMMUNITY)
Admission: EM | Admit: 2024-02-25 | Discharge: 2024-03-01 | DRG: 309 | Disposition: A | Discharge status: Home Health | Attending: Hospitalist | Admitting: Hospitalist

## 2024-02-25 ENCOUNTER — Emergency Department (HOSPITAL_COMMUNITY)

## 2024-02-25 ENCOUNTER — Other Ambulatory Visit: Payer: Self-pay

## 2024-02-25 ENCOUNTER — Telehealth: Payer: Self-pay | Admitting: Cardiology

## 2024-02-25 DIAGNOSIS — R55 Syncope and collapse: Secondary | ICD-10-CM | POA: Diagnosis present

## 2024-02-25 DIAGNOSIS — F40240 Claustrophobia: Secondary | ICD-10-CM | POA: Diagnosis present

## 2024-02-25 DIAGNOSIS — N179 Acute kidney failure, unspecified: Secondary | ICD-10-CM | POA: Diagnosis present

## 2024-02-25 DIAGNOSIS — I4729 Other ventricular tachycardia: Principal | ICD-10-CM | POA: Diagnosis present

## 2024-02-25 DIAGNOSIS — E1142 Type 2 diabetes mellitus with diabetic polyneuropathy: Secondary | ICD-10-CM

## 2024-02-25 DIAGNOSIS — M545 Low back pain, unspecified: Secondary | ICD-10-CM | POA: Diagnosis not present

## 2024-02-25 DIAGNOSIS — I493 Ventricular premature depolarization: Secondary | ICD-10-CM | POA: Diagnosis present

## 2024-02-25 DIAGNOSIS — F32A Depression, unspecified: Secondary | ICD-10-CM | POA: Diagnosis present

## 2024-02-25 DIAGNOSIS — Z8261 Family history of arthritis: Secondary | ICD-10-CM

## 2024-02-25 DIAGNOSIS — G4733 Obstructive sleep apnea (adult) (pediatric): Secondary | ICD-10-CM | POA: Diagnosis present

## 2024-02-25 DIAGNOSIS — M47816 Spondylosis without myelopathy or radiculopathy, lumbar region: Secondary | ICD-10-CM | POA: Diagnosis present

## 2024-02-25 DIAGNOSIS — Z86718 Personal history of other venous thrombosis and embolism: Secondary | ICD-10-CM

## 2024-02-25 DIAGNOSIS — Z9049 Acquired absence of other specified parts of digestive tract: Secondary | ICD-10-CM

## 2024-02-25 DIAGNOSIS — E876 Hypokalemia: Principal | ICD-10-CM | POA: Diagnosis present

## 2024-02-25 DIAGNOSIS — J449 Chronic obstructive pulmonary disease, unspecified: Secondary | ICD-10-CM | POA: Diagnosis present

## 2024-02-25 DIAGNOSIS — Z87891 Personal history of nicotine dependence: Secondary | ICD-10-CM

## 2024-02-25 DIAGNOSIS — N184 Chronic kidney disease, stage 4 (severe): Secondary | ICD-10-CM | POA: Diagnosis present

## 2024-02-25 DIAGNOSIS — Z888 Allergy status to other drugs, medicaments and biological substances status: Secondary | ICD-10-CM

## 2024-02-25 DIAGNOSIS — J9811 Atelectasis: Secondary | ICD-10-CM | POA: Diagnosis not present

## 2024-02-25 DIAGNOSIS — I428 Other cardiomyopathies: Secondary | ICD-10-CM | POA: Diagnosis not present

## 2024-02-25 DIAGNOSIS — Z794 Long term (current) use of insulin: Secondary | ICD-10-CM

## 2024-02-25 DIAGNOSIS — E785 Hyperlipidemia, unspecified: Secondary | ICD-10-CM | POA: Diagnosis present

## 2024-02-25 DIAGNOSIS — I4510 Unspecified right bundle-branch block: Secondary | ICD-10-CM | POA: Diagnosis not present

## 2024-02-25 DIAGNOSIS — I130 Hypertensive heart and chronic kidney disease with heart failure and stage 1 through stage 4 chronic kidney disease, or unspecified chronic kidney disease: Secondary | ICD-10-CM | POA: Diagnosis not present

## 2024-02-25 DIAGNOSIS — R42 Dizziness and giddiness: Secondary | ICD-10-CM | POA: Diagnosis not present

## 2024-02-25 DIAGNOSIS — I472 Ventricular tachycardia, unspecified: Secondary | ICD-10-CM | POA: Diagnosis present

## 2024-02-25 DIAGNOSIS — W1830XA Fall on same level, unspecified, initial encounter: Secondary | ICD-10-CM | POA: Diagnosis present

## 2024-02-25 DIAGNOSIS — Z833 Family history of diabetes mellitus: Secondary | ICD-10-CM | POA: Diagnosis not present

## 2024-02-25 DIAGNOSIS — K219 Gastro-esophageal reflux disease without esophagitis: Secondary | ICD-10-CM | POA: Diagnosis not present

## 2024-02-25 DIAGNOSIS — E1165 Type 2 diabetes mellitus with hyperglycemia: Secondary | ICD-10-CM | POA: Diagnosis present

## 2024-02-25 DIAGNOSIS — Z7901 Long term (current) use of anticoagulants: Secondary | ICD-10-CM | POA: Diagnosis not present

## 2024-02-25 DIAGNOSIS — Z79899 Other long term (current) drug therapy: Secondary | ICD-10-CM

## 2024-02-25 DIAGNOSIS — Z8249 Family history of ischemic heart disease and other diseases of the circulatory system: Secondary | ICD-10-CM

## 2024-02-25 DIAGNOSIS — R079 Chest pain, unspecified: Secondary | ICD-10-CM | POA: Diagnosis not present

## 2024-02-25 DIAGNOSIS — M48061 Spinal stenosis, lumbar region without neurogenic claudication: Secondary | ICD-10-CM | POA: Diagnosis present

## 2024-02-25 DIAGNOSIS — Z7951 Long term (current) use of inhaled steroids: Secondary | ICD-10-CM | POA: Diagnosis not present

## 2024-02-25 DIAGNOSIS — N1832 Chronic kidney disease, stage 3b: Secondary | ICD-10-CM | POA: Diagnosis present

## 2024-02-25 DIAGNOSIS — E1122 Type 2 diabetes mellitus with diabetic chronic kidney disease: Secondary | ICD-10-CM | POA: Diagnosis not present

## 2024-02-25 DIAGNOSIS — Z723 Lack of physical exercise: Secondary | ICD-10-CM

## 2024-02-25 DIAGNOSIS — I4901 Ventricular fibrillation: Secondary | ICD-10-CM | POA: Diagnosis not present

## 2024-02-25 DIAGNOSIS — E1169 Type 2 diabetes mellitus with other specified complication: Secondary | ICD-10-CM | POA: Diagnosis not present

## 2024-02-25 DIAGNOSIS — Z91030 Bee allergy status: Secondary | ICD-10-CM

## 2024-02-25 DIAGNOSIS — M5136 Other intervertebral disc degeneration, lumbar region with discogenic back pain only: Secondary | ICD-10-CM | POA: Diagnosis not present

## 2024-02-25 DIAGNOSIS — M19011 Primary osteoarthritis, right shoulder: Secondary | ICD-10-CM | POA: Diagnosis not present

## 2024-02-25 DIAGNOSIS — Z87442 Personal history of urinary calculi: Secondary | ICD-10-CM

## 2024-02-25 DIAGNOSIS — M25511 Pain in right shoulder: Secondary | ICD-10-CM | POA: Diagnosis not present

## 2024-02-25 DIAGNOSIS — E669 Obesity, unspecified: Secondary | ICD-10-CM | POA: Diagnosis present

## 2024-02-25 DIAGNOSIS — Z6837 Body mass index (BMI) 37.0-37.9, adult: Secondary | ICD-10-CM

## 2024-02-25 DIAGNOSIS — I5020 Unspecified systolic (congestive) heart failure: Secondary | ICD-10-CM | POA: Diagnosis present

## 2024-02-25 DIAGNOSIS — Z95810 Presence of automatic (implantable) cardiac defibrillator: Secondary | ICD-10-CM | POA: Diagnosis present

## 2024-02-25 DIAGNOSIS — R197 Diarrhea, unspecified: Secondary | ICD-10-CM | POA: Diagnosis present

## 2024-02-25 DIAGNOSIS — I5022 Chronic systolic (congestive) heart failure: Secondary | ICD-10-CM | POA: Diagnosis present

## 2024-02-25 DIAGNOSIS — M109 Gout, unspecified: Secondary | ICD-10-CM | POA: Diagnosis not present

## 2024-02-25 DIAGNOSIS — R0602 Shortness of breath: Secondary | ICD-10-CM | POA: Diagnosis not present

## 2024-02-25 DIAGNOSIS — B356 Tinea cruris: Secondary | ICD-10-CM

## 2024-02-25 DIAGNOSIS — Z5986 Financial insecurity: Secondary | ICD-10-CM

## 2024-02-25 DIAGNOSIS — G8929 Other chronic pain: Secondary | ICD-10-CM | POA: Diagnosis present

## 2024-02-25 DIAGNOSIS — Z4502 Encounter for adjustment and management of automatic implantable cardiac defibrillator: Secondary | ICD-10-CM

## 2024-02-25 LAB — CBG MONITORING, ED: Glucose-Capillary: 233 mg/dL — ABNORMAL HIGH (ref 70–99)

## 2024-02-25 LAB — COMPREHENSIVE METABOLIC PANEL WITH GFR
ALT: 22 U/L (ref 0–44)
AST: 24 U/L (ref 15–41)
Albumin: 4.1 g/dL (ref 3.5–5.0)
Alkaline Phosphatase: 64 U/L (ref 38–126)
Anion gap: 22 — ABNORMAL HIGH (ref 5–15)
BUN: 74 mg/dL — ABNORMAL HIGH (ref 6–20)
CO2: 29 mmol/L (ref 22–32)
Calcium: 9.4 mg/dL (ref 8.9–10.3)
Chloride: 89 mmol/L — ABNORMAL LOW (ref 98–111)
Creatinine, Ser: 4.14 mg/dL — ABNORMAL HIGH (ref 0.61–1.24)
GFR, Estimated: 16 mL/min — ABNORMAL LOW (ref >60)
Glucose, Bld: 238 mg/dL — ABNORMAL HIGH (ref 70–99)
Potassium: 3 mmol/L — ABNORMAL LOW (ref 3.5–5.1)
Sodium: 140 mmol/L (ref 135–145)
Total Bilirubin: 1.1 mg/dL (ref 0.0–1.2)
Total Protein: 8 g/dL (ref 6.5–8.1)

## 2024-02-25 LAB — CBC
HCT: 42.4 % (ref 39.0–52.0)
Hemoglobin: 13.9 g/dL (ref 13.0–17.0)
MCH: 29.8 pg (ref 26.0–34.0)
MCHC: 32.8 g/dL (ref 30.0–36.0)
MCV: 90.8 fL (ref 80.0–100.0)
Platelets: 308 K/uL (ref 150–400)
RBC: 4.67 MIL/uL (ref 4.22–5.81)
RDW: 14.2 % (ref 11.5–15.5)
WBC: 8.1 K/uL (ref 4.0–10.5)
nRBC: 0 % (ref 0.0–0.2)

## 2024-02-25 LAB — I-STAT CHEM 8, ED
BUN: 73 mg/dL — ABNORMAL HIGH (ref 6–20)
Calcium, Ion: 0.99 mmol/L — ABNORMAL LOW (ref 1.15–1.40)
Chloride: 91 mmol/L — ABNORMAL LOW (ref 98–111)
Creatinine, Ser: 4.4 mg/dL — ABNORMAL HIGH (ref 0.61–1.24)
Glucose, Bld: 237 mg/dL — ABNORMAL HIGH (ref 70–99)
HCT: 42 % (ref 39.0–52.0)
Hemoglobin: 14.3 g/dL (ref 13.0–17.0)
Potassium: 2.8 mmol/L — ABNORMAL LOW (ref 3.5–5.1)
Sodium: 138 mmol/L (ref 135–145)
TCO2: 35 mmol/L — ABNORMAL HIGH (ref 22–32)

## 2024-02-25 LAB — MAGNESIUM: Magnesium: 1.7 mg/dL (ref 1.7–2.4)

## 2024-02-25 LAB — GLUCOSE, CAPILLARY
Glucose-Capillary: 372 mg/dL — ABNORMAL HIGH (ref 70–99)
Glucose-Capillary: 340 mg/dL — ABNORMAL HIGH (ref 70–99)
Glucose-Capillary: 316 mg/dL — ABNORMAL HIGH (ref 70–99)

## 2024-02-25 LAB — TROPONIN I (HIGH SENSITIVITY)
Troponin I (High Sensitivity): 24 ng/L — ABNORMAL HIGH (ref <18)
Troponin I (High Sensitivity): 23 ng/L — ABNORMAL HIGH (ref <18)

## 2024-02-25 LAB — MRSA NEXT GEN BY PCR, NASAL: MRSA by PCR Next Gen: NOT DETECTED

## 2024-02-25 MED ORDER — INSULIN ASPART 100 UNIT/ML IJ SOLN
0.0000 [IU] | Freq: Three times a day (TID) | INTRAMUSCULAR | Status: DC
  Administered 2024-02-25: 15 [IU] via SUBCUTANEOUS

## 2024-02-25 MED ORDER — SODIUM CHLORIDE 0.9% FLUSH: 3.0000 mL | INTRAVENOUS | Status: DC | PRN

## 2024-02-25 MED ORDER — ASPIRIN 81 MG PO CHEW
324.0000 mg | CHEWABLE_TABLET | Freq: Once | ORAL | Status: AC
  Administered 2024-02-25: 324 mg via ORAL
  Filled 2024-02-25: qty 4

## 2024-02-25 MED ORDER — MAGNESIUM SULFATE 4 GM/100ML IV SOLN
4.0000 g | Freq: Once | INTRAVENOUS | Status: AC
  Administered 2024-02-25: 4 g via INTRAVENOUS
  Filled 2024-02-25: qty 100

## 2024-02-25 MED ORDER — ACETAMINOPHEN 325 MG PO TABS
650.0000 mg | ORAL_TABLET | ORAL | Status: DC | PRN
  Administered 2024-03-01: 650 mg via ORAL
  Filled 2024-02-25 (×2): qty 2

## 2024-02-25 MED ORDER — ALPRAZOLAM 0.25 MG PO TABS: 0.2500 mg | ORAL_TABLET | Freq: Every evening | ORAL | Status: DC | PRN

## 2024-02-25 MED ORDER — ONDANSETRON HCL 4 MG/2ML IJ SOLN: 4.0000 mg | Freq: Four times a day (QID) | INTRAMUSCULAR | Status: DC | PRN

## 2024-02-25 MED ORDER — TAMSULOSIN HCL 0.4 MG PO CAPS
0.8000 mg | ORAL_CAPSULE | Freq: Every day | ORAL | Status: DC
  Administered 2024-02-25 – 2024-03-01 (×6): 0.8 mg via ORAL
  Filled 2024-02-25 (×6): qty 2

## 2024-02-25 MED ORDER — CHLORHEXIDINE GLUCONATE CLOTH 2 % EX PADS
6.0000 | MEDICATED_PAD | Freq: Every day | CUTANEOUS | Status: DC
  Administered 2024-02-25 – 2024-02-27 (×3): 6 via TOPICAL

## 2024-02-25 MED ORDER — METOPROLOL SUCCINATE ER 25 MG PO TB24
12.5000 mg | ORAL_TABLET | Freq: Every day | ORAL | Status: DC
  Administered 2024-02-26 – 2024-03-01 (×5): 12.5 mg via ORAL
  Filled 2024-02-25 (×5): qty 1

## 2024-02-25 MED ORDER — AMIODARONE LOAD VIA INFUSION
150.0000 mg | Freq: Once | INTRAVENOUS | Status: AC
  Administered 2024-02-25: 150 mg via INTRAVENOUS
  Filled 2024-02-25: qty 83.34

## 2024-02-25 MED ORDER — SODIUM CHLORIDE 0.9 % IV SOLN
250.0000 mL | INTRAVENOUS | Status: AC | PRN
  Administered 2024-02-25: 250 mL via INTRAVENOUS

## 2024-02-25 MED ORDER — AMIODARONE HCL IN DEXTROSE 360-4.14 MG/200ML-% IV SOLN
60.0000 mg/h | INTRAVENOUS | Status: AC
  Administered 2024-02-25 (×2): 60 mg/h via INTRAVENOUS
  Filled 2024-02-25 (×2): qty 200

## 2024-02-25 MED ORDER — POTASSIUM CHLORIDE CRYS ER 20 MEQ PO TBCR: 40.0000 meq | EXTENDED_RELEASE_TABLET | Freq: Once | ORAL | Status: DC

## 2024-02-25 MED ORDER — LIDOCAINE 5 % EX PTCH
1.0000 | MEDICATED_PATCH | CUTANEOUS | Status: DC
  Administered 2024-02-25: 1 via TRANSDERMAL
  Filled 2024-02-25: qty 1

## 2024-02-25 MED ORDER — INSULIN ASPART 100 UNIT/ML IJ SOLN
0.0000 [IU] | Freq: Three times a day (TID) | INTRAMUSCULAR | Status: DC
  Administered 2024-02-25: 15 [IU] via SUBCUTANEOUS
  Administered 2024-02-26: 7 [IU] via SUBCUTANEOUS
  Administered 2024-02-26 (×2): 11 [IU] via SUBCUTANEOUS
  Administered 2024-02-26 – 2024-02-27 (×2): 7 [IU] via SUBCUTANEOUS
  Administered 2024-02-27: 4 [IU] via SUBCUTANEOUS
  Administered 2024-02-27 (×2): 11 [IU] via SUBCUTANEOUS
  Administered 2024-02-28: 7 [IU] via SUBCUTANEOUS
  Administered 2024-02-28: 4 [IU] via SUBCUTANEOUS
  Administered 2024-02-28 – 2024-02-29 (×3): 7 [IU] via SUBCUTANEOUS
  Administered 2024-02-29: 4 [IU] via SUBCUTANEOUS
  Administered 2024-02-29: 7 [IU] via SUBCUTANEOUS
  Administered 2024-02-29: 4 [IU] via SUBCUTANEOUS
  Administered 2024-03-01 (×2): 7 [IU] via SUBCUTANEOUS

## 2024-02-25 MED ORDER — SERTRALINE HCL 25 MG PO TABS
75.0000 mg | ORAL_TABLET | Freq: Every morning | ORAL | Status: DC
  Administered 2024-02-26 – 2024-03-01 (×5): 75 mg via ORAL
  Filled 2024-02-25 (×2): qty 3
  Filled 2024-02-25: qty 2
  Filled 2024-02-25 (×2): qty 3

## 2024-02-25 MED ORDER — HEPARIN (PORCINE) 25000 UT/250ML-% IV SOLN
2100.0000 [IU]/h | INTRAVENOUS | Status: AC
  Administered 2024-02-25: 1900 [IU]/h via INTRAVENOUS
  Administered 2024-02-26: 2100 [IU]/h via INTRAVENOUS
  Filled 2024-02-25 (×2): qty 250

## 2024-02-25 MED ORDER — AMIODARONE HCL IN DEXTROSE 360-4.14 MG/200ML-% IV SOLN
30.0000 mg/h | INTRAVENOUS | Status: DC
  Administered 2024-02-25 – 2024-02-26 (×2): 30 mg/h via INTRAVENOUS
  Filled 2024-02-25: qty 200

## 2024-02-25 MED ORDER — ORAL CARE MOUTH RINSE: 15.0000 mL | OROMUCOSAL | Status: DC | PRN

## 2024-02-25 MED ORDER — PANTOPRAZOLE SODIUM 40 MG PO TBEC
40.0000 mg | DELAYED_RELEASE_TABLET | Freq: Every day | ORAL | Status: DC
  Administered 2024-02-25 – 2024-03-01 (×6): 40 mg via ORAL
  Filled 2024-02-25 (×6): qty 1

## 2024-02-25 MED ORDER — POTASSIUM CHLORIDE CRYS ER 20 MEQ PO TBCR
40.0000 meq | EXTENDED_RELEASE_TABLET | ORAL | Status: AC
  Administered 2024-02-25 (×2): 40 meq via ORAL
  Filled 2024-02-25 (×2): qty 2

## 2024-02-25 MED ORDER — SODIUM CHLORIDE 0.9% FLUSH
3.0000 mL | Freq: Two times a day (BID) | INTRAVENOUS | Status: DC
  Administered 2024-02-25 – 2024-03-01 (×11): 3 mL via INTRAVENOUS

## 2024-02-25 NOTE — ED Triage Notes (Signed)
 Pt to ED via EMS with c/o syncopal episode last night. Pt denies hitting head, but hit right shoulder. +blood thinner. Pt's doctor called him this am to tell pt he had a run of vtach and the defibrillator fired and to come to the hospital. Pt c/o chest heaviness denies pain. Per EMS, pt's systolic initially 110 then 84. Pt given 200ml NS and systolic went back up to 100s. Pt refused aspirin  from EMS. Pt states I'm not supposed to take it. Pt breathing even and unlabored. Pt A&Ox4.

## 2024-02-25 NOTE — Telephone Encounter (Signed)
 Pt c/o Syncope: STAT if syncope occurred within 24 hours and pt complains of lightheadedness  Did you pass out today?  Around 1:00 AM - 2:00 AM   When is the last time you passed out?    Has this occurred multiple times?  No    Did you have any symptoms prior to passing out?  Just felt different   5. Did you fall? If so, are you on a blood thinner? Yes, patient fell. Yes, he is on a blood thinner.    Pt c/o of Chest Pain: STAT if active (IN THIS MOMENT) CP, including tightness, pressure, jaw pain, shoulder/upper arm/back pain, SOB, nausea, and vomiting.  1. Are you having CP right now (tightness, pressure, or discomfort)?  Patient says he feels pain in his back, not chest  2. Are you experiencing any other symptoms (ex. SOB, nausea, vomiting, sweating)?  Leg pain  3. How long have you been experiencing CP?  Patient says he always has CP and it isn't bad but he has heart failure   4. Is your CP continuous or coming and going?  Coming and going   5. Have you taken Nitroglycerin ?   No

## 2024-02-25 NOTE — TOC Initial Note (Signed)
 Transition of Care [facility]) - Initial/Assessment Note    Patient Details  Name: [name] MRN: [id number] Date of Birth: 1965-08-11  Transition of Care [facility]) CM/SW Contact:    Justina Delcia Czar, RN Phone Number: [phone] 02/25/2024, 3:43 PM  Clinical Narrative:                  Spoke to pt and states he was independent pta. Drives to his appts. Has scale at home for daily weights, and has Living Better with HF booklet. CM educated on importance of daily weights.    Will schedule a PCP follow up appt on day of dc.   Will continue to follow for dc needs.    Expected Discharge Plan: Home/Self Care Barriers to Discharge: Continued Medical Work up   Patient Goals and CMS Choice Patient states their goals for this hospitalization and ongoing recovery are:: wants to recover          Expected Discharge Plan and Services   Discharge Planning Services: CM Consult   Living arrangements for the past 2 months: Single Family Home                                      Prior Living Arrangements/Services Living arrangements for the past 2 months: Single Family Home Lives with:: Siblings Patient language and need for interpreter reviewed:: Yes Do you feel safe going back to the place where you live?: Yes      Need for Family Participation in Patient Care: No (Comment) Care giver support system in place?: No (comment) Current home services: DME (walker, cane, scale) Criminal Activity/Legal Involvement Pertinent to Current Situation/Hospitalization: No - Comment as needed  Activities of Daily Living   ADL Screening (condition at time of admission) Independently performs ADLs?: Yes (appropriate for developmental age) Is the patient deaf or have difficulty hearing?: No Does the patient have difficulty seeing, even when wearing glasses/contacts?: No Does the patient have difficulty concentrating, remembering, or making decisions?: No  Permission  Sought/Granted Permission sought to share information with : Case Manager, PCP, Family Supports Permission granted to share information with : Yes, Verbal Permission Granted  Share Information with NAME: Manual Kasandra  Permission granted to share info w AGENCY: PCP, DME  Permission granted to share info w Relationship: brother  Permission granted to share info w Contact Information: [phone]  Emotional Assessment Appearance:: Appears stated age Attitude/Demeanor/Rapport: Engaged Affect (typically observed): Accepting Orientation: : Oriented to Self, Oriented to Place, Oriented to  Time, Oriented to Situation   Psych Involvement: No (comment)  Admission diagnosis:  Hypokalemia [E87.6] Ventricular tachycardia (HCC) [I47.20] VT (ventricular tachycardia) (HCC) [I47.20] Patient Active Problem List   Diagnosis Date Noted   Acute respiratory failure with hypoxia (HCC) 12/20/2023   COPD with acute exacerbation (HCC) 12/19/2023   Acute exacerbation of CHF (congestive heart failure) (HCC) 12/19/2023   ARF (acute renal failure) (HCC) 10/08/2023   Acute gout 10/08/2023   Anemia 10/08/2023   Acute renal failure (ARF) (HCC) 10/08/2023   Hypotension 09/07/2023   Acute deep vein thrombosis (DVT) of left upper extremity (HCC) 04/24/2023   Cellulitis of perineum 04/02/2023   Heart failure (HCC) 02/28/2023   Class 3 obesity 02/28/2023   SOB (shortness of breath) 02/27/2023   Loud snoring 10/28/2022   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   Hyperlipidemia associated with type 2 diabetes mellitus (HCC) 09/18/2022  PTSD (post-traumatic stress disorder) 07/10/2022   Panic attack 06/17/2022   Passive suicidal ideations 06/17/2022   History of ventricular tachycardia 06/14/2022   Essential hypertension 06/14/2022   Stage 3b chronic kidney disease (HCC) 05/15/2022   Acute gout due to renal impairment involving left ankle 05/07/2022   Acute gout due to renal impairment involving right  knee 05/07/2022   Hyponatremia 05/07/2022   Diarrhea 05/06/2022   Hyperglycemia 05/06/2022   Cellulitis of buttock 05/06/2022   Acute on chronic combined systolic and diastolic CHF (congestive heart failure) (HCC) 05/06/2022   Acute renal failure superimposed on stage 3b chronic kidney disease (HCC) 05/06/2022   Hypokalemia 05/05/2022   Rectal abscess 05/05/2022   Left buttock abscess 05/05/2022   Cardiac arrest (HCC) 05/03/2022   AKI (acute kidney injury) (HCC) 05/03/2022   VT (ventricular tachycardia) (HCC) 05/03/2022   CKD (chronic kidney disease) stage 2, GFR 60-89 ml/min 10/24/2019   Acute non-recurrent frontal sinusitis 06/27/2019   COVID-19 virus infection 06/27/2019   Glaucoma suspect 12/22/2018   Seasonal and perennial allergic rhinoconjunctivitis 10/25/2018   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   Urticaria 05/03/2018   S/P inguinal hernia repair 03/01/2018   Unilateral inguinal hernia without obstruction or gangrene 07/24/2017   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   Hypersomnia 02/28/2016   Abnormal PFT 02/24/2016   Dyspnea 01/28/2016   Diabetic polyneuropathy associated with type 2 diabetes mellitus (HCC) 12/18/2015   Lumbar back pain 12/18/2015   Type 2 diabetes mellitus with hyperlipidemia (HCC) 09/17/2015   Left median nerve neuropathy 04/16/2015   Lesion of right median nerve at forearm 04/16/2015   ICD (implantable cardioverter-defibrillator) in place 03/28/2014   Chronic systolic congestive heart failure (HCC) 03/28/2014   Nonischemic cardiomyopathy (HCC) 11/09/2013   Syncope 10/20/2013   Chest pain 07/28/2013   RENAL CALCULUS 05/13/2009   Obesity 05/08/2009   Former heavy cigarette smoker (20-39 per day) 05/08/2009   HOARSENESS 05/08/2009   GAD (generalized anxiety disorder) 02/28/2008   ASTHMATIC BRONCHITIS, ACUTE 02/28/2008   GERD 02/28/2008   IRRITABLE BOWEL SYNDROME 02/28/2008   PCP:  Vicci Barnie NOVAK,  MD Pharmacy:   [facility] [address] [place], [place] - [address] [place] [address] Phone: [phone] Fax: [phone]     Social Drivers of Health (SDOH) Social History: SDOH Screenings   Food Insecurity: No Food Insecurity (02/25/2024)  Housing: Low Risk  (02/25/2024)  Transportation Needs: No Transportation Needs (02/25/2024)  Utilities: Not At Risk (02/25/2024)  Alcohol Screen: Low Risk  (05/25/2023)  Depression (PHQ2-9): High Risk (02/05/2024)  Financial Resource Strain: Low Risk  (05/25/2023)  Recent Concern: Financial Resource Strain - High Risk (03/24/2023)  Physical Activity: Inactive (05/25/2023)  Social Connections: Moderately Integrated (10/08/2023)  Stress: Stress Concern Present (05/25/2023)  Tobacco Use: Medium Risk (02/25/2024)  Health Literacy: Adequate Health Literacy (05/25/2023)   SDOH Interventions:     Readmission Risk Interventions    12/21/2023    4:08 PM  Readmission Risk Prevention Plan  Transportation Screening Complete  Medication Review (RN Care Manager) Complete  PCP or Specialist appointment within 3-5 days of discharge Complete  HRI or Home Care Consult Complete  Palliative Care Screening Not Applicable  Skilled Nursing Facility Not Applicable

## 2024-02-25 NOTE — H&P (Signed)
 Advanced Heart Failure Team History and Physical Note   PCP:  Vicci Barnie NOVAK, MD  PCP-Cardiology: Redell Shallow, MD     Reason for Admission: Polymorphic VT/VF   HPI:   [name] is a 58 y.o. male with HFrEF (dx 2015) w/ LHC demonstrating normal coronary anatomy s/p primary prevention ICD, OSA unable to tolerate CPAP, CKDIIIB & T2DM.   In November 2023, he was admitted for VT believed to be secondary to significant electrolyte derrangements from and underlying right gluteal abscess. He had recurrent device shocks for VT. R/LHC demonstrated normal coronary anatomy, normal cardiac output and low filling pressures. He subsequently underwent placement of right atrial lead with improvement in ectopy after atrial pacing. TTE during admission with LVEF of 30-35%.    Echo 3/24 EF 20-25%, moderate LVH, RV okay.    Admitted 10/24 with groin cellulitis. Treated with IV antibiotics. He is now off Farxiga .   Readmitted 11/24 with extensive left upper extremity DVT. He was started on Eliquis . Echo 11/24: EF 25-30%.   Echo 3/25: EF up to 40-45%, RV okay   Seen in ED 09/07/23 with RSV infection. A week later given course of doxycycline  d/t persistent symptoms.   Admitted 5/25 with AKI 2/2 decrease oral intake. SCr peaked at 4.6 (blaseline 2.7). GDMT held and received IVF. He was restarted on Toprol  and torsemide , discharged home, weight 279 lbs.   He was readmitted 07/12-07/29/25 with acute on chronic CHF, RLE DVT, gout, left-sided chest pain, left shoulder/back pain d/t osteoarthritis/possible gout. Cardiology consulted for acute on chronic CHF. He was diuresed. GDMT limited by CKD. Chest pain felt to be most likely musculoskeletal in origin. Echo during admit with LVEF 40-45%, moderate LVH, RV okay.   Early this am the patient was standing up and having a conversation with his friend.  He subsequently felt lightheaded and attempted to sit down. He then woke up on the floor across the  room. Did not hit his head but did land on right shoulder. He called EP clinic and his device was interrogated. At the time of his syncopal episode, patient had polymorphic VT that degenerated into VF. Received ATP without change in rhythm, then spontaneous termination. Also had polymorphic NSVTs the day prior. He was referred to the ED for evaluation. Reports several episodes of diarrhea over the last 24 hrs but no vomiting, fevers or chills. Has been in bed almost a week d/t back pain, unable to get meds from pain clinic.  Labs significant for Scr 4.14, BUN 74, K 3.0, CO2 29, mag 1.7. EP consulted and started amiodarone  gtt. He will be admitted to the ICU for further management.  Home Medications Prior to Admission medications   Medication Sig Start Date End Date Taking? Authorizing Provider  amiodarone  (PACERONE ) 200 MG tablet Take 1 tablet by mouth once daily 02/08/24  Yes Vicci Barnie NOVAK, MD  atorvastatin  (LIPITOR) 10 MG tablet Take 1 tablet (10 mg total) by mouth daily. 12/14/23  Yes Vicci Barnie NOVAK, MD  benzonatate  (TESSALON ) 200 MG capsule Take 200 mg by mouth 2 (two) times daily as needed for cough. 11/01/23  Yes [provider]  cetirizine  (EQ ALLERGY RELIEF, CETIRIZINE ,) 10 MG tablet Take 1 tablet (10 mg total) by mouth daily. 09/30/23  Yes Vicci Barnie NOVAK, MD  cholecalciferol  (VITAMIN D3) 25 MCG (1000 UNIT) tablet Take 1,000 Units by mouth in the morning.   Yes [provider]  clonazePAM  (KLONOPIN ) 0.5 MG tablet Take 1 tablet (0.5  mg total) by mouth 2 (two) times daily as needed for anxiety. Patient taking differently: Take 0.5 mg by mouth 2 (two) times daily. 02/18/24 02/17/25 Yes Carvin Arvella HERO, MD  Clotrimazole  1 % OINT Apply to the affected area twice a day Patient taking differently: Apply 1 Application topically 2 (two) times daily as needed (irritation). 09/09/23  Yes Newlin, Enobong, MD  colchicine  0.6 MG tablet 1/2 tab PO Q Mon/Wed/Fri Patient taking  differently: Take 0.3 mg by mouth every Monday, Wednesday, and Friday. 12/15/23  Yes Vicci Barnie NOVAK, MD  EPINEPHrine  0.3 mg/0.3 mL IJ SOAJ injection Inject 0.3 mg into the muscle as needed. 01/30/22  Yes Iva Marty Saltness, MD  fluticasone  (FLONASE ) 50 MCG/ACT nasal spray Place 2 sprays into both nostrils daily. 11/16/23  Yes Hedges, Reyes, PA-C  gabapentin  (NEURONTIN ) 300 MG capsule Take 1 capsule (300 mg total) by mouth at bedtime. 11/25/23  Yes Vicci Barnie NOVAK, MD  ibuprofen (ADVIL) 200 MG tablet Take 600 mg by mouth every 6 (six) hours as needed for moderate pain (pain score 4-6).   Yes [provider]  insulin  aspart (NOVOLOG  FLEXPEN) 100 UNIT/ML FlexPen Inject 10-20 Units into the skin 3 (three) times daily before meals. Patient taking differently: Inject 22 Units into the skin 2 (two) times daily. 10/13/23  Yes Regalado, Belkys A, MD  insulin  glargine (LANTUS  SOLOSTAR) 100 UNIT/ML Solostar Pen Inject 30 Units into the skin daily. Patient taking differently: Inject 30 Units into the skin at bedtime. 01/05/24  Yes Elpidio Reyes DEL, MD  loratadine  (CLARITIN ) 10 MG tablet Take 1 tablet (10 mg total) by mouth daily. 11/16/23  Yes Hedges, Reyes, PA-C  methocarbamol  (ROBAXIN ) 500 MG tablet Take 1 tablet (500 mg total) by mouth daily. 11/25/23  Yes Vicci Barnie NOVAK, MD  metolazone  (ZAROXOLYN ) 5 MG tablet Take 1 tablet (5 mg total) by mouth once a week. 11/25/23 02/25/24 Yes Vicci Barnie NOVAK, MD  metoprolol  succinate (TOPROL -XL) 25 MG 24 hr tablet Take 0.5 tablets (12.5 mg total) by mouth daily. 12/14/23  Yes Vicci Barnie NOVAK, MD  nystatin  (MYCOSTATIN /NYSTOP ) powder Apply 1 Application topically 3 (three) times daily. 11/25/23  Yes Vicci Barnie NOVAK, MD  pantoprazole  (PROTONIX ) 40 MG tablet Take 1 tablet (40 mg total) by mouth daily. 01/05/24  Yes Elpidio Reyes DEL, MD  potassium chloride  SA (KLOR-CON  M) 20 MEQ tablet Take 3 tablets (60 mEq total) by mouth daily. 01/19/24  Yes Colletta Manuelita Garre, PA-C  prednisoLONE acetate (PRED FORTE) 1 % ophthalmic suspension Place 1 drop into the left eye at bedtime. 02/09/24  Yes [provider]  rivaroxaban  (XARELTO ) 20 MG TABS tablet Take 1 tablet (20 mg total) by mouth daily with supper. 02/05/24  Yes Vicci Barnie NOVAK, MD  sertraline  (ZOLOFT ) 50 MG tablet Take 1.5 tablets (75 mg total) by mouth in the morning. 11/19/23 11/18/24 Yes Carvin Arvella HERO, MD  SYMBICORT  160-4.5 MCG/ACT inhaler Inhale 2 puffs into the lungs in the morning and at bedtime. 08/28/23  Yes [provider]  tamsulosin  (FLOMAX ) 0.4 MG CAPS capsule Take 2 capsules (0.8 mg total) by mouth daily. 11/25/23  Yes Vicci Barnie NOVAK, MD  torsemide  (DEMADEX ) 20 MG tablet Take 2 tablets (40 mg total) by mouth daily. 11/04/23  Yes Milford, Harlene HERO, FNP  acetaminophen -codeine  (TYLENOL  #3) 300-30 MG tablet Take 1 tablet by mouth every 8 (eight) hours as needed for moderate pain (pain score 4-6) or severe pain (pain score 7-10). Patient not taking: Reported on 02/25/2024  02/18/24   Williams, Megan E, NP  Continuous Glucose Sensor (FREESTYLE LIBRE 2 PLUS SENSOR) MISC Change sensor every 15 days. 02/17/24   Vicci Barnie NOVAK, MD  Insulin  Pen Needle (TECHLITE PEN NEEDLES) 32G X 4 MM MISC Use to inject insulin  into the skin 3 times per day. 10/13/23   Regalado, Belkys A, MD  latanoprost (XALATAN) 0.005 % ophthalmic solution Place 1 drop into both eyes at bedtime. Patient not taking: Reported on 02/25/2024 02/16/24   [provider]    Past Medical History: Past Medical History:  Diagnosis Date   AICD (automatic cardioverter/defibrillator) present    Chronic bronchitis (HCC)    get it most q yr (12/23/2013)   Chronic systolic (congestive) heart failure (HCC)    Fracture of left humerus    a. 07/2013.   GERD (gastroesophageal reflux disease)    not at present time   Heart murmur    born w/it    History of renal calculi    Hypertension    Kidney stones    NICM  (nonischemic cardiomyopathy) (HCC)    a. 07/2013 Echo: EF 20-25%, diff HK, Gr2 DD, mild MR, mod dil LA/RA. 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    Shockable heart rhythm detected by automated external defibrillator    Syncope    Type II diabetes mellitus (HCC) 2006    Past Surgical History: Past Surgical History:  Procedure Laterality Date   CARDIAC CATHETERIZATION  08/10/13   CHOLECYSTECTOMY  ~ 2010   IMPLANTABLE CARDIOVERTER DEFIBRILLATOR IMPLANT  12/23/2013   STJ Fortify ICD implanted by Dr Kelsie for cardiomyopathy and syncope   IMPLANTABLE CARDIOVERTER DEFIBRILLATOR IMPLANT N/A 12/23/2013   Procedure: IMPLANTABLE CARDIOVERTER DEFIBRILLATOR IMPLANT;  Surgeon: Lynwood JONETTA Kelsie, MD;  Location: [facility] CATH LAB;  Service: Cardiovascular;  Laterality: N/A;   INGUINAL HERNIA REPAIR Left 03/01/2018   Procedure: OPEN REPAIR OF LEFT INGUINAL HERNIA WITH MESH;  Surgeon: Tanda Locus, MD;  Location: [facility] ORS;  Service: General;  Laterality: Left;   LEAD REVISION/REPAIR N/A 05/15/2022   Procedure: LEAD REVISION/REPAIR;  Surgeon: [name] Ole DASEN, MD;  Location: [facility] INVASIVE CV LAB;  Service: Cardiovascular;  Laterality: N/A;   LEFT HEART CATHETERIZATION WITH CORONARY ANGIOGRAM N/A 08/10/2013   Procedure: LEFT HEART CATHETERIZATION WITH CORONARY ANGIOGRAM;  Surgeon: Candyce GORMAN Reek, MD;  Location: [facility] CATH LAB;  Service: Cardiovascular;  Laterality: N/A;   ORIF HUMERUS FRACTURE Left 08/12/2013   Procedure: OPEN REDUCTION INTERNAL FIXATION (ORIF) HUMERAL SHAFT FRACTURE;  Surgeon: Jerona LULLA Sage, MD;  Location: [facility] OR;  Service: Orthopedics;  Laterality: Left;  Open Reduction Internal Fixation Left Humerus   RIGHT/LEFT HEART CATH AND CORONARY ANGIOGRAPHY N/A 05/12/2022   Procedure: RIGHT/LEFT HEART CATH AND CORONARY ANGIOGRAPHY;  Surgeon: Gardenia Led, DO;  Location: [facility] INVASIVE CV LAB;  Service: Cardiovascular;  Laterality: N/A;    URETEROSCOPY     laser for kidney stones    Family History:  Family History  Problem Relation Age of Onset   Diabetes Father    Arthritis Father    Hypertension Father    Diabetes Mother    Arthritis Mother    Hypertension Mother    Hypertension Brother     Social History: Social History   Socioeconomic History   Marital status: Single    Spouse name: Not on file   Number of children: Not on file   Years of education: Not on  file   Highest education level: Not on file  Occupational History   Occupation: Disabled  Tobacco Use   Smoking status: Former    Current packs/day: 0.00    Average packs/day: 1 pack/day for 28.0 years (28.0 ttl pk-yrs)    Types: Cigarettes    Start date: 06/03/1986    Quit date: 06/03/2014    Years since quitting: 9.7   Smokeless tobacco: Never  Vaping Use   Vaping status: Never Used  Substance and Sexual Activity   Alcohol use: Not Currently   Drug use: Yes    Types: Marijuana    Comment: occasionally;   Sexual activity: Yes  Other Topics Concern   Not on file  Social History Narrative   Pt lives with his brother    Social Drivers of Health   Financial Resource Strain: Low Risk  (05/25/2023)   Overall Financial Resource Strain (CARDIA)    Difficulty of Paying Living Expenses: Not hard at all  Recent Concern: Financial Resource Strain - High Risk (03/24/2023)   Overall Financial Resource Strain (CARDIA)    Difficulty of Paying Living Expenses: Hard  Food Insecurity: No Food Insecurity (12/20/2023)   Hunger Vital Sign    Worried About Running Out of Food in the Last Year: Never true    Ran Out of Food in the Last Year: Never true  Transportation Needs: No Transportation Needs (12/20/2023)   PRAPARE - Administrator, Civil Service (Medical): No    Lack of Transportation (Non-Medical): No  Physical Activity: Inactive (05/25/2023)   Exercise Vital Sign    Days of Exercise per Week: 0 days    Minutes of Exercise per  Session: 0 min  Stress: Stress Concern Present (05/25/2023)   [employer] of Occupational Health - Occupational Stress Questionnaire    Feeling of Stress : Rather much  Social Connections: Moderately Integrated (10/08/2023)   Social Connection and Isolation Panel    Frequency of Communication with Friends and Family: More than three times a week    Frequency of Social Gatherings with Friends and Family: More than three times a week    Attends Religious Services: More than 4 times per year    Active Member of [employer] or Organizations: Yes    Attends Banker Meetings: 1 to 4 times per year    Marital Status: Never married    Allergies:  Allergies  Allergen Reactions   Bee Venom Anaphylaxis   Trulicity  [Dulaglutide ] Anaphylaxis and Diarrhea    Extra dose of Trulicity  caused cardiac arrest. Patient experience onset of diarrhea with using this medication - cleared after med stopped.    Objective:    Vital Signs:   Temp:  [97.9 F (36.6 C)] 97.9 F (36.6 C) (09/18 1042) Pulse Rate:  [78-79] 78 (09/18 1200) Resp:  [12-16] 16 (09/18 1200) BP: (112-120)/(81-82) 112/82 (09/18 1200) SpO2:  [98 %-99 %] 99 % (09/18 1200) Weight:  [127 kg] 127 kg (09/18 1039)   Filed Weights   02/25/24 1039  Weight: 127 kg     Physical Exam     General:  Well appearing.  Cor: No JVD. Regular rate & rhythm. No  murmurs. Lungs: Breathing nonlabored Abdomen: obese, soft, nontender, nondistended. Extremities: No edema Neuro: Alert & oriented x 3. Affect pleasant.   Telemetry   SR with frequent PVCs  Labs     Basic Metabolic Panel: Recent Labs  Lab 02/25/24 1156  NA 138  K 2.8*  CL 91*  GLUCOSE 237*  BUN 73*  CREATININE 4.40*    Liver Function Tests: No results for input(s): AST, ALT, ALKPHOS, BILITOT, PROT, ALBUMIN  in the last 168 hours. No results for input(s): LIPASE, AMYLASE in the last 168 hours. No results for input(s): AMMONIA in the last  168 hours.  CBC: Recent Labs  Lab 02/25/24 1144 02/25/24 1156  WBC 8.1  --   HGB 13.9 14.3  HCT 42.4 42.0  MCV 90.8  --   PLT 308  --     Cardiac Enzymes: No results for input(s): CKTOTAL, CKMB, CKMBINDEX, TROPONINI in the last 168 hours.  BNP: BNP (last 3 results) Recent Labs    10/23/23 1105 12/19/23 1756 01/18/24 1558  BNP 25.5 27.0 23.3    ProBNP (last 3 results) No results for input(s): PROBNP in the last 8760 hours.   CBG: Recent Labs  Lab 02/25/24 1146  GLUCAP 233*    Coagulation Studies: No results for input(s): LABPROT, INR in the last 72 hours.  Imaging: DG Shoulder Right Result Date: 02/25/2024 CLINICAL DATA:  Right shoulder pain after fall last night. EXAM: RIGHT SHOULDER - 2+ VIEW COMPARISON:  None Available. FINDINGS: There is no evidence of fracture or dislocation. Mild degenerative disease is seen involving the right glenohumeral joint. Soft tissues are unremarkable. 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 No acute abnormality seen. Electronically Signed   By: Lynwood [name] Raddle M.D.   On: 02/25/2024 12:09   DG Chest 1 View Result Date: 02/25/2024 CLINICAL DATA:  Chest pain, syncope. EXAM: CHEST  1 VIEW COMPARISON:  December 25, 2023. FINDINGS: Stable cardiomediastinal silhouette. Left-sided defibrillator is unchanged. Hypoinflation of the lungs is noted with minimal bibasilar subsegmental atelectasis. Bony thorax is unremarkable. IMPRESSION: Hypoinflation of the lungs with minimal bibasilar subsegmental atelectasis. Electronically Signed   By: Lynwood [name] Raddle M.D.   On: 02/25/2024 12:06   DG Lumbar Spine Complete Result Date: 02/25/2024 CLINICAL DATA:  Lower back pain after fall last night. EXAM: LUMBAR SPINE - COMPLETE 4+ VIEW COMPARISON:  May 27, 2023. FINDINGS: No fracture or spondylolisthesis is noted. Moderate degenerative disc disease is noted at L2-3. Anterior osteophyte formation is noted at  multiple other levels of the lumbar spine. IMPRESSION: Multilevel degenerative changes as described above. No acute abnormality seen. Electronically Signed   By: Lynwood [name] Raddle M.D.   On: 02/25/2024 12:05     Patient Profile  58 y.o. 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   Admitted after a syncopal episode d/t polymorphic VT/VF  Assessment/Plan   1. Polymorphic VT/VF - Hx VT storm in 11/23 2/2 metabolic abnormalities in setting of gluteal abscess - Has been maintained on po amiodarone  - Now with polymorphic VT >>> VF s/p ATP with no change. Spontaneously terminated. - Load with IV amiodarone  per EP - Supp K and Mag aggressively today - Repeat echo - May need cMRI, will discuss with Dr. Gardenia  2. Heart failure with reduced EF>>now HFmrEF - Nonischemic as demonstrated by coronary angiography 12/23; no family history.  - Echo 07/25: EF 40-45%, RV okay - NYHA IIIb at baseline. Volume looks good on exam. Suspect he may be on the dry side with bump in BUN and Scr. Hold home diuretics today.  - Off Entresto  and spiro with CKD - Off SGLT2i w/ groin cellulitis - Continue Toprol  XL 12.5 mg daily  3. DM II - Uncontrolled, last A1c 8.5 -  Avoid SGLT2i with hx of groin infections - Per PCP   4. AKI on CKD IV  - Baseline SCr 2.5-2.7, Cr up to 4 today - Needs to stop using NSAIDs - Suspect volume depletion. Holding diuretics as above  5. Hx DVT - Recurrent - Recently found to have RLE DVT while on Eliquis  - Now on Xarelto  20 mg daily. Will discuss renal dosing with pharmD - has f/u with hematology   6. OSA - severe by sleep study - just received new machine but has been unable to tolerate CPAP so far   7. COPD - Follows with Dr. Meade - On Symbicort    [facility], Arionne Iams N, PA-C 02/25/2024, 12:42 PM  Total Time Spent 35  Advanced Heart Failure Team Pager [phone] (M-F; 7a - 5p)  Please contact [facility] for  night-coverage after hours (4p -7a ) and weekends on [email or web address]

## 2024-02-25 NOTE — Telephone Encounter (Signed)
 Spoke with the patient who reports that around 2 am this morning he was up talking with a friend he began to feel out of it and was disorientated. He went to sit down and the next thing he knew he woke up on the ground. He states that he was passed out for less than a minute. He states that he did not hit his head but he did fall pretty hard. He is unsure of whether or not his ICD went off. Patient states that he is still in bed this morning and just feels fatigued. He states that he is not having chest pain but is having pain in his legs and back which he is seeing a pain specialist for next week. Currently denies any lightheadedness, dizziness, chest pain, or shortness of breath.  I spoke with the device clinic and they have contacted the patient to send over a remote transmission for review.

## 2024-02-25 NOTE — Plan of Care (Signed)
  Problem: Education: Goal: Ability to describe self-care measures that may prevent or decrease complications (Diabetes Survival Skills Education) will improve Outcome: Progressing Goal: Individualized Educational Video(s) Outcome: Progressing   Problem: Coping: Goal: Ability to adjust to condition or change in health will improve Outcome: Progressing   Problem: Fluid Volume: Goal: Ability to maintain a balanced intake and output will improve Outcome: Progressing   Problem: Nutritional: Goal: Maintenance of adequate nutrition will improve Outcome: Progressing

## 2024-02-25 NOTE — Progress Notes (Signed)
 This encounter was created in error - please disregard.  Pt was hospitalized today and will be rescheduled.

## 2024-02-25 NOTE — Consult Note (Signed)
 Cardiology Consultation   Patient ID: [name] MRN: [id number]; DOB: [birth year]  Admit date: 02/25/2024 Date of Consult: 02/25/2024  PCP:  Vicci Barnie NOVAK, MD   [facility] Providers Cardiologist:  Redell Shallow, MD  Electrophysiologist:  OLE ONEIDA HOLTS, MD   Patient Profile: [name] is a 58 y.o. male with a hx of  NICM/chronic systolic CHF  normal coronary arteries by cath 2015, 2023 GERD, HTN and DM2, COPD DVT VT/VF.   Historically: Admitted 05/03/22 with VT storm, 22 ICD shocks and 2 ATP, 10 aborted shocks on initial arrival. Felt to be likely in setting of hypokalemia, diarrhea, and infection. ----- However had recurrent VT despite management and correction of both and back on amiodarone  gtt  >>  PO amiodarone  > developed bradycardia and intermittent RV pacing that triggered NSPMVT episodes,  ------ ultimately felt to be pause dependent and not pacing triggered. R/LHC looked good, volume stable, no obstructive CAD, LVEF stable 30-35% ------- Underwent device upgrade to a dual chamber device 05/15/22 by Dr. HOLTS  Device information Abbott single chamber ICD implanted 12/23/2013 >> upgraded to dual chamber devce ( A lead added) 05/15/22 + appropriate tx, VT storm   AAD hx Amiodarone  started Nov 2023    who is being seen 02/25/2024 for the evaluation of syncope, recurrent VT/VF at the request of Dr. Randol.  History of Present Illness: [name] last saw AHF team in Mat 2025, reported chronic need for 6 pillows, some SOB he perceive as COPD, his torsemide  was increased Was pending CPAP.  He was admitted in July 2025 for HF exacerbation Diuresed and discharged  And again in July for  COPD exacerbation Gout Widely fluctuating sugars  NOW:  Our device clinic received an alert for treated episode  C/w PMVT > VF > got ATP without change in rhythm > folloed by spontaneous termination Also noted NSVTs (polymorphic)  Associated with full  syncope  He tends to be a night owl >> was up and about at the time > suddenly felt poorly > and synopsized. His brother in the bedroom near by heard a loud noise went in and found him on the floor, fairly quickly coming around. Ultimately felt :OK and went to bed When RN from clinic called him, reported feeling poorly with some chest discomfort and called EMS  Noting new of late, reports good medication compliance Had some diarrhea yesterday thinks he ate something bad, but nothing excessive  LABS  Are pending  This is his 1st treated episode since 2023 + known NSVTs  Past Medical History:  Diagnosis Date   AICD (automatic cardioverter/defibrillator) present    Chronic bronchitis (HCC)    get it most q yr (12/23/2013)   Chronic systolic (congestive) heart failure (HCC)    Fracture of left humerus    a. 07/2013.   GERD (gastroesophageal reflux disease)    not at present time   Heart murmur    born w/it    History of renal calculi    Hypertension    Kidney stones    NICM (nonischemic cardiomyopathy) (HCC)    a. 07/2013 Echo: EF 20-25%, diff HK, Gr2 DD, mild MR, mod dil LA/RA. 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    Shockable heart rhythm detected by automated external defibrillator    Syncope    Type II diabetes mellitus (HCC) 2006    Past Surgical History:  Procedure Laterality Date   CARDIAC CATHETERIZATION  08/10/13   CHOLECYSTECTOMY  ~ 2010   IMPLANTABLE CARDIOVERTER DEFIBRILLATOR IMPLANT  12/23/2013   STJ Fortify ICD implanted by Dr Kelsie for cardiomyopathy and syncope   IMPLANTABLE CARDIOVERTER DEFIBRILLATOR IMPLANT N/A 12/23/2013   Procedure: IMPLANTABLE CARDIOVERTER DEFIBRILLATOR IMPLANT;  Surgeon: Lynwood JONETTA Kelsie, MD;  Location: [facility] CATH LAB;  Service: Cardiovascular;  Laterality: N/A;   INGUINAL HERNIA REPAIR Left 03/01/2018   Procedure: OPEN REPAIR OF LEFT INGUINAL HERNIA WITH MESH;   Surgeon: Tanda Locus, MD;  Location: [facility] ORS;  Service: General;  Laterality: Left;   LEAD REVISION/REPAIR N/A 05/15/2022   Procedure: LEAD REVISION/REPAIR;  Surgeon: [name] Ole DASEN, MD;  Location: [facility] INVASIVE CV LAB;  Service: Cardiovascular;  Laterality: N/A;   LEFT HEART CATHETERIZATION WITH CORONARY ANGIOGRAM N/A 08/10/2013   Procedure: LEFT HEART CATHETERIZATION WITH CORONARY ANGIOGRAM;  Surgeon: Candyce GORMAN Reek, MD;  Location: [facility] CATH LAB;  Service: Cardiovascular;  Laterality: N/A;   ORIF HUMERUS FRACTURE Left 08/12/2013   Procedure: OPEN REDUCTION INTERNAL FIXATION (ORIF) HUMERAL SHAFT FRACTURE;  Surgeon: Jerona LULLA Sage, MD;  Location: [facility] OR;  Service: Orthopedics;  Laterality: Left;  Open Reduction Internal Fixation Left Humerus   RIGHT/LEFT HEART CATH AND CORONARY ANGIOGRAPHY N/A 05/12/2022   Procedure: RIGHT/LEFT HEART CATH AND CORONARY ANGIOGRAPHY;  Surgeon: Gardenia Led, DO;  Location: [facility] INVASIVE CV LAB;  Service: Cardiovascular;  Laterality: N/A;   URETEROSCOPY     laser for kidney stones     Home Medications:  Prior to Admission medications   Medication Sig Start Date End Date Taking? Authorizing Provider  acetaminophen  (TYLENOL ) 500 MG tablet Take 1,500 mg by mouth every 8 (eight) hours as needed for moderate pain (pain score 4-6).    [provider]  acetaminophen -codeine  (TYLENOL  #3) 300-30 MG tablet Take 1 tablet by mouth every 8 (eight) hours as needed for moderate pain (pain score 4-6) or severe pain (pain score 7-10). 02/18/24   Williams, Megan E, NP  amiodarone  (PACERONE ) 200 MG tablet Take 1 tablet by mouth once daily 02/08/24   Vicci Barnie NOVAK, MD  atorvastatin  (LIPITOR) 10 MG tablet Take 1 tablet (10 mg total) by mouth daily. 12/14/23   Vicci Barnie NOVAK, MD  benzonatate  (TESSALON ) 200 MG capsule Take 200 mg by mouth 2 (two) times daily as needed for cough. 11/01/23   [provider]  cetirizine  (EQ ALLERGY RELIEF, CETIRIZINE ,) 10 MG tablet Take  1 tablet (10 mg total) by mouth daily. 09/30/23   Vicci Barnie NOVAK, MD  cholecalciferol  (VITAMIN D3) 25 MCG (1000 UNIT) tablet Take 1,000 Units by mouth in the morning.    [provider]  clonazePAM  (KLONOPIN ) 0.5 MG tablet Take 1 tablet (0.5 mg total) by mouth 2 (two) times daily as needed for anxiety. 02/18/24 02/17/25  Carvin Arvella HERO, MD  Clotrimazole  1 % OINT Apply to the affected area twice a day 09/09/23   Newlin, Enobong, MD  colchicine  0.6 MG tablet 1/2 tab PO Q Mon/Wed/Fri 12/15/23   Vicci Barnie NOVAK, MD  Continuous Glucose Sensor (FREESTYLE LIBRE 2 PLUS SENSOR) MISC Change sensor every 15 days. 02/17/24   Vicci Barnie NOVAK, MD  EPINEPHrine  0.3 mg/0.3 mL IJ SOAJ injection Inject 0.3 mg into the muscle as needed. 01/30/22   Iva Marty Saltness, MD  fluticasone  (FLONASE ) 50 MCG/ACT nasal spray Place 2 sprays into both nostrils daily. 11/16/23   Hedges, Reyes, PA-C  gabapentin  (NEURONTIN ) 300 MG capsule Take 1 capsule (300 mg  total) by mouth at bedtime. 11/25/23   Vicci Barnie NOVAK, MD  insulin  aspart (NOVOLOG  FLEXPEN) 100 UNIT/ML FlexPen Inject 10-20 Units into the skin 3 (three) times daily before meals. 10/13/23   Regalado, Belkys A, MD  insulin  glargine (LANTUS  SOLOSTAR) 100 UNIT/ML Solostar Pen Inject 30 Units into the skin daily. 01/05/24   Elpidio Reyes DEL, MD  Insulin  Pen Needle (TECHLITE PEN NEEDLES) 32G X 4 MM MISC Use to inject insulin  into the skin 3 times per day. 10/13/23   Regalado, Belkys A, MD  loratadine  (CLARITIN ) 10 MG tablet Take 1 tablet (10 mg total) by mouth daily. 11/16/23   Hedges, Reyes, PA-C  methocarbamol  (ROBAXIN ) 500 MG tablet Take 1 tablet (500 mg total) by mouth daily. 11/25/23   Vicci Barnie NOVAK, MD  metolazone  (ZAROXOLYN ) 5 MG tablet Take 1 tablet (5 mg total) by mouth once a week. 11/25/23 02/23/24  Vicci Barnie NOVAK, MD  metoprolol  succinate (TOPROL -XL) 25 MG 24 hr tablet Take 0.5 tablets (12.5 mg total) by mouth daily. 12/14/23   Vicci Barnie NOVAK, MD   nystatin  (MYCOSTATIN /NYSTOP ) powder Apply 1 Application topically 3 (three) times daily. 11/25/23   Vicci Barnie NOVAK, MD  pantoprazole  (PROTONIX ) 40 MG tablet Take 1 tablet (40 mg total) by mouth daily. 01/05/24   Elpidio Reyes DEL, MD  Polyethyl Glycol-Propyl Glycol (SYSTANE) 0.4-0.3 % SOLN Place 1 drop into both eyes daily as needed (dry eyes).    [provider]  potassium chloride  SA (KLOR-CON  M) 20 MEQ tablet Take 3 tablets (60 mEq total) by mouth daily. 01/19/24   Colletta Manuelita Garre, PA-C  rivaroxaban  (XARELTO ) 20 MG TABS tablet Take 1 tablet (20 mg total) by mouth daily with supper. 02/05/24   Vicci Barnie NOVAK, MD  sertraline  (ZOLOFT ) 50 MG tablet Take 1.5 tablets (75 mg total) by mouth in the morning. 11/19/23 11/18/24  Carvin Arvella HERO, MD  SYMBICORT  160-4.5 MCG/ACT inhaler Inhale 2 puffs into the lungs daily. 08/28/23   [provider]  tamsulosin  (FLOMAX ) 0.4 MG CAPS capsule Take 2 capsules (0.8 mg total) by mouth daily. 11/25/23   Vicci Barnie NOVAK, MD  timolol  (TIMOPTIC ) 0.5 % ophthalmic solution Place 1 drop into both eyes every morning. 11/27/23   [provider]  torsemide  (DEMADEX ) 20 MG tablet Take 2 tablets (40 mg total) by mouth daily. 11/04/23   Glena Harlene HERO, FNP    Scheduled Meds:  aspirin   324 mg Oral Once   Continuous Infusions:  PRN Meds:   Allergies:    Allergies  Allergen Reactions   Bee Venom Anaphylaxis   Trulicity  [Dulaglutide ] Anaphylaxis and Diarrhea    Extra dose of Trulicity  caused cardiac arrest. Patient experience onset of diarrhea with using this medication - cleared after med stopped.    Social History:   Social History   Socioeconomic History   Marital status: Single    Spouse name: Not on file   Number of children: Not on file   Years of education: Not on file   Highest education level: Not on file  Occupational History   Occupation: Disabled  Tobacco Use   Smoking status: Former    Current packs/day:  0.00    Average packs/day: 1 pack/day for 28.0 years (28.0 ttl pk-yrs)    Types: Cigarettes    Start date: 06/03/1986    Quit date: 06/03/2014    Years since quitting: 9.7   Smokeless tobacco: Never  Vaping Use   Vaping status: Never Used  Substance and  Sexual Activity   Alcohol use: Not Currently   Drug use: Yes    Types: Marijuana    Comment: occasionally;   Sexual activity: Yes  Other Topics Concern   Not on file  Social History Narrative   Pt lives with his brother    Social Drivers of Health   Financial Resource Strain: Low Risk  (05/25/2023)   Overall Financial Resource Strain (CARDIA)    Difficulty of Paying Living Expenses: Not hard at all  Recent Concern: Financial Resource Strain - High Risk (03/24/2023)   Overall Financial Resource Strain (CARDIA)    Difficulty of Paying Living Expenses: Hard  Food Insecurity: No Food Insecurity (12/20/2023)   Hunger Vital Sign    Worried About Running Out of Food in the Last Year: Never true    Ran Out of Food in the Last Year: Never true  Transportation Needs: No Transportation Needs (12/20/2023)   PRAPARE - Administrator, Civil Service (Medical): No    Lack of Transportation (Non-Medical): No  Physical Activity: Inactive (05/25/2023)   Exercise Vital Sign    Days of Exercise per Week: 0 days    Minutes of Exercise per Session: 0 min  Stress: Stress Concern Present (05/25/2023)   [employer] of Occupational Health - Occupational Stress Questionnaire    Feeling of Stress : Rather much  Social Connections: Moderately Integrated (10/08/2023)   Social Connection and Isolation Panel    Frequency of Communication with Friends and Family: More than three times a week    Frequency of Social Gatherings with Friends and Family: More than three times a week    Attends Religious Services: More than 4 times per year    Active Member of [employer] or Organizations: Yes    Attends Banker Meetings: 1 to 4 times per  year    Marital Status: Never married  Intimate Partner Violence: Not At Risk (12/20/2023)   Humiliation, Afraid, Rape, and Kick questionnaire    Fear of Current or Ex-Partner: No    Emotionally Abused: No    Physically Abused: No    Sexually Abused: No    Family History:   Family History  Problem Relation Age of Onset   Diabetes Father    Arthritis Father    Hypertension Father    Diabetes Mother    Arthritis Mother    Hypertension Mother    Hypertension Brother      ROS:  Please see the history of present illness.  All other ROS reviewed and negative.     Physical Exam/Data: Vitals:   02/25/24 1039 02/25/24 1042  BP:  120/81  Pulse:  79  Resp:  12  Temp:  97.9 F (36.6 C)  TempSrc:  Oral  SpO2:  98%  Weight: 127 kg   Height: 6' (1.829 m)    No intake or output data in the 24 hours ending 02/25/24 1117    02/25/2024   10:39 AM 02/16/2024    3:44 PM 02/05/2024    3:38 PM  Last 3 Weights  Weight (lbs) 279 lb 15.8 oz 282 lb 285 lb  Weight (kg) 127 kg 127.914 kg 129.275 kg     Body mass index is 37.97 kg/m.  General:  Well nourished, well developed, in no acute distress HEENT: normal Neck: no JVD Vascular: No carotid bruits; Distal pulses 2+ bilaterally Cardiac:  RRR; no murmurs Lungs:  CTA b/l, no wheezing, rhonchi or rales  Abd: soft, nontender  Ext: no  edema Musculoskeletal:  No deformities Skin: warm and dry  Neuro: no focal abnormalities noted Psych:  Normal affect   EKG:  The EKG was personally reviewed and demonstrates:    SR 81bpm, 1st degree AVblock ( ) , LAD, no acute/ischemic changes  Telemetry:  Telemetry was personally reviewed and demonstrates:   SR 80's  Relevant CV Studies:  12/22/23: TTE 1. Left ventricular ejection fraction, by estimation, is 40 to 45%. The  left ventricle has mildly decreased function. The left ventricle  demonstrates global hypokinesis. The left ventricular internal cavity size  was mildly dilated. There is  moderate  concentric left ventricular hypertrophy. Left ventricular diastolic  parameters are consistent with Grade I diastolic dysfunction (impaired  relaxation).   2. Right ventricular systolic function is normal. The right ventricular  size is normal. Tricuspid regurgitation signal is inadequate for assessing  PA pressure.   3. Left atrial size was mildly dilated.   4. The mitral valve is normal in structure. No evidence of mitral valve  regurgitation.   5. The aortic valve is tricuspid. Aortic valve regurgitation is not  visualized. No aortic stenosis is present.   6. The inferior vena cava is normal in size with greater than 50%  respiratory variability, suggesting right atrial pressure of 3 mmHg.   Laboratory Data: High Sensitivity Troponin:  No results for input(s): TROPONINIHS in the last 720 hours.   ChemistryNo results for input(s): NA, K, CL, CO2, GLUCOSE, BUN, CREATININE, CALCIUM , MG, GFRNONAA, GFRAA, ANIONGAP in the last 168 hours.  No results for input(s): PROT, ALBUMIN , AST, ALT, ALKPHOS, BILITOT in the last 168 hours. Lipids No results for input(s): CHOL, TRIG, HDL, LABVLDL, LDLCALC, CHOLHDL in the last 168 hours.  HematologyNo results for input(s): WBC, RBC, HGB, HCT, MCV, MCH, MCHC, RDW, PLT in the last 168 hours. Thyroid  No results for input(s): TSH, FREET4 in the last 168 hours.  BNPNo results for input(s): BNP, PROBNP in the last 168 hours.  DDimer No results for input(s): DDIMER in the last 168 hours.  Radiology/Studies:  No results found.   Assessment and Plan:  PMVT/VF Known for him, though had been quite for some time Reports good medication compliance Dr. Almetta has been bedside ---- device rep to reduce NID in VT 2 zone to 20 and in the FV zone to 16 to try and avoid syncope ---- await labs/lytes ---- pending labs, anticipate ami gtt ~ 24 hours and add on mexileine ---- at  some point an MRI  NICM Last echo LVEF 40-45% Chronic CHF Not grossly volume OL by exam Has some, no unusual SOB  Given VF > rec admit to 2H I have asked cardmaster to make AHF team aware for consult and admission   For questions or updates, please contact [facility] Please consult [email or web address] for contact info under    Signed, Charlies Macario Arthur, PA-C  02/25/2024 11:17 AM

## 2024-02-25 NOTE — Progress Notes (Signed)
 Heart Failure Navigator Progress Note  Assessed for Heart & Vascular TOC clinic readiness.  Patient does not meet criteria due to Advanced Heart Failure Team patient of Dr. Gasper Lloyd.   Navigator will sign off at this time.   Rhae Hammock, BSN, Scientist, clinical (histocompatibility and immunogenetics) Only

## 2024-02-25 NOTE — Telephone Encounter (Signed)
 Device Transmission Reviewed:   2 EVENTS AT  2:27AM and 2:28AM this morning 02/25/24: consistent with syncopal event: VT, appears consistent with possible Torsades, treated with ATP X 1 and converted back to NSR.  Patient is very symptomatic this am, weak with heaviness in chest when trying to get out of bed. Discussed event with patient.  He is calling EMS immediately to transport to [facility] ER. He does have someone in the home with him and agrees to call EMS.   Also, note 2 VT events at 3:29 and 3:30pm yesterday 02/24/24.  Longest duration 8 seconds.  [place] DMV driving restrictions also given due to event, no driving for 6months from event.   Notifying EP team in hospital today to make them aware.

## 2024-02-25 NOTE — ED Provider Notes (Signed)
 [place] EMERGENCY DEPARTMENT AT [facility] Provider Note   CSN: [id number] Arrival date & time: 02/25/24  1035     Patient presents with: Loss of Consciousness   [name] is a 58 y.o. male.    Loss of Consciousness    Patient has a history of chronic bronchitis, kidney stones, obesity hypertension, diabetes, nonischemic cardiomyopathy, CHF.  Presents ED for evaluation of a syncopal episode.  Patient states he has been having some trouble with his lower back this past week.  Is been staying in bed more for this reason.  Patient is scheduled to see a pain management doctor.  Patient did have an episode of feeling somewhat lightheaded yesterday in retrospect but did not think much of it.  Early this morning however he was feeling lightheaded when he stood up.  He felt weak all over.  He ended up having a syncopal episode.  Patient was found on the ground by his partner.  Patient was immediately alert and awake.  He denied any headache or head injury.  Patient states since that time he has had some aching discomfort in his chest.  He does not have any fevers or chills.  No vomiting.  No abdominal pain.  Patient states he did have a few episodes of loose stools yesterday.  Prior to Admission medications   Medication Sig Start Date End Date Taking? Authorizing Provider  amiodarone  (PACERONE ) 200 MG tablet Take 1 tablet by mouth once daily 02/08/24  Yes Vicci Barnie NOVAK, MD  atorvastatin  (LIPITOR) 10 MG tablet Take 1 tablet (10 mg total) by mouth daily. 12/14/23  Yes Vicci Barnie NOVAK, MD  benzonatate  (TESSALON ) 200 MG capsule Take 200 mg by mouth 2 (two) times daily as needed for cough. 11/01/23  Yes [provider]  cetirizine  (EQ ALLERGY RELIEF, CETIRIZINE ,) 10 MG tablet Take 1 tablet (10 mg total) by mouth daily. 09/30/23  Yes Vicci Barnie NOVAK, MD  cholecalciferol  (VITAMIN D3) 25 MCG (1000 UNIT) tablet Take 1,000 Units by mouth in the morning.   Yes [provider]  clonazePAM  (KLONOPIN ) 0.5 MG tablet Take 1 tablet (0.5 mg total) by mouth 2 (two) times daily as needed for anxiety. Patient taking differently: Take 0.5 mg by mouth 2 (two) times daily. 02/18/24 02/17/25 Yes Carvin Arvella HERO, MD  Clotrimazole  1 % OINT Apply to the affected area twice a day Patient taking differently: Apply 1 Application topically 2 (two) times daily as needed (irritation). 09/09/23  Yes Newlin, Enobong, MD  colchicine  0.6 MG tablet 1/2 tab PO Q Mon/Wed/Fri Patient taking differently: Take 0.3 mg by mouth every Monday, Wednesday, and Friday. 12/15/23  Yes Vicci Barnie NOVAK, MD  EPINEPHrine  0.3 mg/0.3 mL IJ SOAJ injection Inject 0.3 mg into the muscle as needed. 01/30/22  Yes Iva Marty Saltness, MD  fluticasone  (FLONASE ) 50 MCG/ACT nasal spray Place 2 sprays into both nostrils daily. 11/16/23  Yes Hedges, Reyes, PA-C  gabapentin  (NEURONTIN ) 300 MG capsule Take 1 capsule (300 mg total) by mouth at bedtime. 11/25/23  Yes Vicci Barnie NOVAK, MD  ibuprofen (ADVIL) 200 MG tablet Take 600 mg by mouth every 6 (six) hours as needed for moderate pain (pain score 4-6).   Yes [provider]  insulin  aspart (NOVOLOG  FLEXPEN) 100 UNIT/ML FlexPen Inject 10-20 Units into the skin 3 (three) times daily before meals. Patient taking differently: Inject 22 Units into the skin 2 (two) times daily. 10/13/23  Yes Regalado, Owen LABOR, MD  insulin  glargine (  LANTUS  SOLOSTAR) 100 UNIT/ML Solostar Pen Inject 30 Units into the skin daily. Patient taking differently: Inject 30 Units into the skin at bedtime. 01/05/24  Yes Elpidio Reyes DEL, MD  loratadine  (CLARITIN ) 10 MG tablet Take 1 tablet (10 mg total) by mouth daily. 11/16/23  Yes Hedges, Reyes, PA-C  methocarbamol  (ROBAXIN ) 500 MG tablet Take 1 tablet (500 mg total) by mouth daily. 11/25/23  Yes Vicci Barnie NOVAK, MD  metolazone  (ZAROXOLYN ) 5 MG tablet Take 1 tablet (5 mg total) by mouth once a week. 11/25/23 02/25/24 Yes Vicci Barnie NOVAK,  MD  metoprolol  succinate (TOPROL -XL) 25 MG 24 hr tablet Take 0.5 tablets (12.5 mg total) by mouth daily. 12/14/23  Yes Vicci Barnie NOVAK, MD  nystatin  (MYCOSTATIN /NYSTOP ) powder Apply 1 Application topically 3 (three) times daily. 11/25/23  Yes Vicci Barnie NOVAK, MD  pantoprazole  (PROTONIX ) 40 MG tablet Take 1 tablet (40 mg total) by mouth daily. 01/05/24  Yes Elpidio Reyes DEL, MD  potassium chloride  SA (KLOR-CON  M) 20 MEQ tablet Take 3 tablets (60 mEq total) by mouth daily. 01/19/24  Yes Colletta Manuelita Garre, PA-C  prednisoLONE acetate (PRED FORTE) 1 % ophthalmic suspension Place 1 drop into the left eye at bedtime. 02/09/24  Yes [provider]  rivaroxaban  (XARELTO ) 20 MG TABS tablet Take 1 tablet (20 mg total) by mouth daily with supper. 02/05/24  Yes Vicci Barnie NOVAK, MD  sertraline  (ZOLOFT ) 50 MG tablet Take 1.5 tablets (75 mg total) by mouth in the morning. 11/19/23 11/18/24 Yes Carvin Arvella HERO, MD  SYMBICORT  160-4.5 MCG/ACT inhaler Inhale 2 puffs into the lungs in the morning and at bedtime. 08/28/23  Yes [provider]  tamsulosin  (FLOMAX ) 0.4 MG CAPS capsule Take 2 capsules (0.8 mg total) by mouth daily. 11/25/23  Yes Vicci Barnie NOVAK, MD  torsemide  (DEMADEX ) 20 MG tablet Take 2 tablets (40 mg total) by mouth daily. 11/04/23  Yes Milford, Harlene HERO, FNP  acetaminophen -codeine  (TYLENOL  #3) 300-30 MG tablet Take 1 tablet by mouth every 8 (eight) hours as needed for moderate pain (pain score 4-6) or severe pain (pain score 7-10). Patient not taking: Reported on 02/25/2024 02/18/24   Williams, Megan E, NP  Continuous Glucose Sensor (FREESTYLE LIBRE 2 PLUS SENSOR) MISC Change sensor every 15 days. 02/17/24   Vicci Barnie NOVAK, MD  Insulin  Pen Needle (TECHLITE PEN NEEDLES) 32G X 4 MM MISC Use to inject insulin  into the skin 3 times per day. 10/13/23   Regalado, Belkys A, MD  latanoprost (XALATAN) 0.005 % ophthalmic solution Place 1 drop into both eyes at bedtime. Patient not taking:  Reported on 02/25/2024 02/16/24   [provider]    Allergies: Bee venom and Trulicity  [dulaglutide ]    Review of Systems  Cardiovascular:  Positive for syncope.    Updated Vital Signs BP 112/82   Pulse 78   Temp 97.9 F (36.6 C) (Oral)   Resp 16   Ht 1.829 m (6')   Wt 127 kg   SpO2 99%   BMI 37.97 kg/m   Physical Exam Vitals and nursing note reviewed.  Constitutional:      Appearance: He is well-developed. He is not diaphoretic.  HENT:     Head: Normocephalic and atraumatic.     Right Ear: External ear normal.     Left Ear: External ear normal.  Eyes:     General: No scleral icterus.       Right eye: No discharge.        Left eye:  No discharge.     Conjunctiva/sclera: Conjunctivae normal.  Neck:     Trachea: No tracheal deviation.  Cardiovascular:     Rate and Rhythm: Normal rate and regular rhythm.  Pulmonary:     Effort: Pulmonary effort is normal. No respiratory distress.     Breath sounds: Normal breath sounds. No stridor. No wheezing or rales.  Abdominal:     General: Bowel sounds are normal. There is no distension.     Palpations: Abdomen is soft.     Tenderness: There is no abdominal tenderness. There is no guarding or rebound.  Musculoskeletal:        General: No deformity.     Cervical back: Normal and neck supple. No tenderness.     Thoracic back: No tenderness.     Lumbar back: Tenderness present.  Skin:    General: Skin is warm and dry.     Findings: No rash.  Neurological:     General: No focal deficit present.     Mental Status: He is alert.     Cranial Nerves: No cranial nerve deficit, dysarthria or facial asymmetry.     Sensory: No sensory deficit.     Motor: No abnormal muscle tone or seizure activity.     Coordination: Coordination normal.  Psychiatric:        Mood and Affect: Mood normal.     (all labs ordered are listed, but only abnormal results are displayed) Labs Reviewed  CBG MONITORING, ED - Abnormal; Notable for the  following components:      Result Value   Glucose-Capillary 233 (*)    All other components within normal limits  I-STAT CHEM 8, ED - Abnormal; Notable for the following components:   Potassium 2.8 (*)    Chloride 91 (*)    BUN 73 (*)    Creatinine, Ser 4.40 (*)    Glucose, Bld 237 (*)    Calcium , Ion 0.99 (*)    TCO2 35 (*)    All other components within normal limits  CBC  COMPREHENSIVE METABOLIC PANEL WITH GFR  MAGNESIUM   TROPONIN I (HIGH SENSITIVITY)  TROPONIN I (HIGH SENSITIVITY)    EKG: EKG Interpretation Date/Time:  Thursday February 25 2024 10:42:28 EDT Ventricular Rate:  81 PR Interval:  206 QRS Duration:  124 QT Interval:  468 QTC Calculation: 544 R Axis:   249  Text Interpretation: Sinus rhythm Borderline prolonged PR interval Nonspecific IVCD with LAD Borderline T abnormalities, lateral leads No significant change since last tracing Confirmed by Randol Simmonds [phone]) on 02/25/2024 11:00:17 AM  Radiology: ARCOLA Shoulder Right Result Date: 02/25/2024 CLINICAL DATA:  Right shoulder pain after fall last night. EXAM: RIGHT SHOULDER - 2+ VIEW COMPARISON:  None Available. FINDINGS: There is no evidence of fracture or dislocation. Mild degenerative disease is seen involving the right glenohumeral joint. Soft tissues are unremarkable. 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 No acute abnormality seen. Electronically Signed   By: Lynwood [name] Raddle M.D.   On: 02/25/2024 12:09   DG Chest 1 View Result Date: 02/25/2024 CLINICAL DATA:  Chest pain, syncope. EXAM: CHEST  1 VIEW COMPARISON:  December 25, 2023. FINDINGS: Stable cardiomediastinal silhouette. Left-sided defibrillator is unchanged. Hypoinflation of the lungs is noted with minimal bibasilar subsegmental atelectasis. Bony thorax is unremarkable. IMPRESSION: Hypoinflation of the lungs with minimal bibasilar subsegmental atelectasis. Electronically Signed   By: Lynwood [name] Raddle M.D.   On: 02/25/2024 12:06    DG Lumbar Spine Complete Result  Date: 02/25/2024 CLINICAL DATA:  Lower back pain after fall last night. EXAM: LUMBAR SPINE - COMPLETE 4+ VIEW COMPARISON:  May 27, 2023. FINDINGS: No fracture or spondylolisthesis is noted. Moderate degenerative disc disease is noted at L2-3. Anterior osteophyte formation is noted at multiple other levels of the lumbar spine. IMPRESSION: Multilevel degenerative changes as described above. No acute abnormality seen. Electronically Signed   By: Lynwood [name] Raddle M.D.   On: 02/25/2024 12:05     Procedures   Medications Ordered in the ED  amiodarone  (NEXTERONE ) 1.8 mg/mL load via infusion 150 mg (has no administration in time range)    Followed by  amiodarone  (NEXTERONE  PREMIX) 360-4.14 MG/200ML-% (1.8 mg/mL) IV infusion (has no administration in time range)    Followed by  amiodarone  (NEXTERONE  PREMIX) 360-4.14 MG/200ML-% (1.8 mg/mL) IV infusion (has no administration in time range)  potassium chloride  SA (KLOR-CON  M) CR tablet 40 mEq (has no administration in time range)  insulin  aspart (novoLOG ) injection 0-20 Units (has no administration in time range)  aspirin  chewable tablet 324 mg (324 mg Oral Given 02/25/24 1154)    Clinical Course as of 02/25/24 1305  Thu Feb 25, 2024  1137 Dr Doreatha cardiology came to evaluate pt in the ED [JK]    Clinical Course User Index [JK] Randol Simmonds, MD                                 Medical Decision Making Problems Addressed: Hypokalemia: acute illness or injury that poses a threat to life or bodily functions Ventricular tachycardia [facility]): acute illness or injury that poses a threat to life or bodily functions  Amount and/or Complexity of Data Reviewed Labs: ordered. Radiology: ordered.  Risk OTC drugs. Decision regarding hospitalization.   Patient presented to the ED for evaluation of a syncopal episode.  Patient does have history of ventricular tachycardia and has an AICD  Patient's device indicated he  had 2 runs of V. tach yesterday.  He had 2 episodes today.  1 did result in a syncopal episode.  There was no AICD shock.  Patient's laboratory test do show hypokalemia potassium 2.8.  He does have chronic kidney disease and his creatinine is increased compared to previous.  Patient's comprehensive metabolic panel troponins and magnesium  are still pending.  EP cardiology service has evaluated the patient in the ED.  Patient will be admitted to hospital for further treatment.  Patient has remained stable in the ED with no recurrent episodes at this time     Final diagnoses:  Hypokalemia  Ventricular tachycardia [facility])    ED Discharge Orders     None          Randol Simmonds, MD 02/25/24 [phone]

## 2024-02-25 NOTE — Progress Notes (Signed)
 ANTICOAGULATION CONSULT NOTE  Pharmacy Consult for heparin  Indication: Hx DVT on Xarelto  PTA  Allergies  Allergen Reactions   Bee Venom Anaphylaxis   Trulicity  [Dulaglutide ] Anaphylaxis and Diarrhea    Extra dose of Trulicity  caused cardiac arrest. Patient experience onset of diarrhea with using this medication - cleared after med stopped.    Patient Measurements: Height: 6' (182.9 cm) Weight: 127 kg (279 lb 15.8 oz) IBW/kg (Calculated) : 77.6 Heparin  Dosing Weight: 106 kg  Vital Signs: Temp: 97.9 F (36.6 C) (09/18 1042) Temp Source: Oral (09/18 1042) BP: 103/78 (09/18 1500) Pulse Rate: 77 (09/18 1500)  Labs: Recent Labs    02/25/24 1144 02/25/24 1156 02/25/24 1329  HGB 13.9 14.3  --   HCT 42.4 42.0  --   PLT 308  --   --   CREATININE 4.14* 4.40*  --   TROPONINIHS 24*  --  23*    Estimated Creatinine Clearance: 24.9 mL/min (A) (by C-G formula based on SCr of 4.4 mg/dL (H)).  Medical History: Past Medical History:  Diagnosis Date   AICD (automatic cardioverter/defibrillator) present    Chronic bronchitis (HCC)    get it most q yr (12/23/2013)   Chronic systolic (congestive) heart failure (HCC)    Fracture of left humerus    a. 07/2013.   GERD (gastroesophageal reflux disease)    not at present time   Heart murmur    born w/it    History of renal calculi    Hypertension    Kidney stones    NICM (nonischemic cardiomyopathy) (HCC)    a. 07/2013 Echo: EF 20-25%, diff HK, Gr2 DD, mild MR, mod dil LA/RA. 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  Pharmacy  consulted for heparin  dosing.  PTA Xarelto  - last dose 9/17 PM.    CBC stable - Hgb 13.9, Plt 308.   Goal of Therapy:  Heparin  level 0.3-0.7 units/ml aPTT 66-102 seconds Monitor platelets by anticoagulation protocol: Yes   Plan:  Heparin  infusion 1900 units/hr, no bolus w/ recent DOAC  8 hour aPTT/anti-Xa Daily CBC, anti-Xa level, aPTT until correlating  Monitor for s/sx of bleeding  Maurilio Fila, PharmD Clinical Pharmacist 02/25/2024  3:03 PM

## 2024-02-26 ENCOUNTER — Inpatient Hospital Stay (HOSPITAL_COMMUNITY)

## 2024-02-26 DIAGNOSIS — I4901 Ventricular fibrillation: Secondary | ICD-10-CM | POA: Diagnosis not present

## 2024-02-26 DIAGNOSIS — I472 Ventricular tachycardia, unspecified: Secondary | ICD-10-CM | POA: Diagnosis not present

## 2024-02-26 DIAGNOSIS — I5022 Chronic systolic (congestive) heart failure: Secondary | ICD-10-CM | POA: Diagnosis not present

## 2024-02-26 LAB — BASIC METABOLIC PANEL WITH GFR
Anion gap: 13 (ref 5–15)
Anion gap: 16 — ABNORMAL HIGH (ref 5–15)
Anion gap: 17 — ABNORMAL HIGH (ref 5–15)
BUN: 64 mg/dL — ABNORMAL HIGH (ref 6–20)
BUN: 68 mg/dL — ABNORMAL HIGH (ref 6–20)
BUN: 72 mg/dL — ABNORMAL HIGH (ref 6–20)
CO2: 28 mmol/L (ref 22–32)
CO2: 28 mmol/L (ref 22–32)
CO2: 28 mmol/L (ref 22–32)
Calcium: 8.9 mg/dL (ref 8.9–10.3)
Calcium: 9 mg/dL (ref 8.9–10.3)
Calcium: 9.1 mg/dL (ref 8.9–10.3)
Chloride: 89 mmol/L — ABNORMAL LOW (ref 98–111)
Chloride: 92 mmol/L — ABNORMAL LOW (ref 98–111)
Chloride: 94 mmol/L — ABNORMAL LOW (ref 98–111)
Creatinine, Ser: 3.53 mg/dL — ABNORMAL HIGH (ref 0.61–1.24)
Creatinine, Ser: 3.73 mg/dL — ABNORMAL HIGH (ref 0.61–1.24)
Creatinine, Ser: 4.28 mg/dL — ABNORMAL HIGH (ref 0.61–1.24)
GFR, Estimated: 15 mL/min — ABNORMAL LOW (ref >60)
GFR, Estimated: 18 mL/min — ABNORMAL LOW (ref >60)
GFR, Estimated: 19 mL/min — ABNORMAL LOW (ref >60)
Glucose, Bld: 271 mg/dL — ABNORMAL HIGH (ref 70–99)
Glucose, Bld: 318 mg/dL — ABNORMAL HIGH (ref 70–99)
Glucose, Bld: 326 mg/dL — ABNORMAL HIGH (ref 70–99)
Potassium: 3 mmol/L — ABNORMAL LOW (ref 3.5–5.1)
Potassium: 3.2 mmol/L — ABNORMAL LOW (ref 3.5–5.1)
Potassium: 3.6 mmol/L (ref 3.5–5.1)
Sodium: 134 mmol/L — ABNORMAL LOW (ref 135–145)
Sodium: 135 mmol/L (ref 135–145)
Sodium: 136 mmol/L (ref 135–145)

## 2024-02-26 LAB — HEPARIN LEVEL (UNFRACTIONATED): Heparin Unfractionated: >1.1 [IU]/mL — ABNORMAL HIGH (ref 0.30–0.70)

## 2024-02-26 LAB — CBC
HCT: 38.4 % — ABNORMAL LOW (ref 39.0–52.0)
Hemoglobin: 12.9 g/dL — ABNORMAL LOW (ref 13.0–17.0)
MCH: 30.1 pg (ref 26.0–34.0)
MCHC: 33.6 g/dL (ref 30.0–36.0)
MCV: 89.5 fL (ref 80.0–100.0)
Platelets: 272 K/uL (ref 150–400)
RBC: 4.29 MIL/uL (ref 4.22–5.81)
RDW: 14.1 % (ref 11.5–15.5)
WBC: 7.1 K/uL (ref 4.0–10.5)
nRBC: 0 % (ref 0.0–0.2)

## 2024-02-26 LAB — ECHOCARDIOGRAM LIMITED
Area-P 1/2: 3.42 cm2
Height: 72 in
S' Lateral: 4.7 cm
Weight: 4395.2 [oz_av]

## 2024-02-26 LAB — GLUCOSE, CAPILLARY
Glucose-Capillary: 298 mg/dL — ABNORMAL HIGH (ref 70–99)
Glucose-Capillary: 232 mg/dL — ABNORMAL HIGH (ref 70–99)
Glucose-Capillary: 283 mg/dL — ABNORMAL HIGH (ref 70–99)
Glucose-Capillary: 246 mg/dL — ABNORMAL HIGH (ref 70–99)

## 2024-02-26 LAB — MAGNESIUM: Magnesium: 2.3 mg/dL (ref 1.7–2.4)

## 2024-02-26 LAB — APTT
aPTT: 101 s — ABNORMAL HIGH (ref 24–36)
aPTT: 60 s — ABNORMAL HIGH (ref 24–36)

## 2024-02-26 MED ORDER — COLCHICINE 0.3 MG HALF TABLET
0.3000 mg | ORAL_TABLET | ORAL | Status: DC
  Administered 2024-02-26 – 2024-02-29 (×2): 0.3 mg via ORAL
  Filled 2024-02-26 (×2): qty 1

## 2024-02-26 MED ORDER — POTASSIUM CHLORIDE CRYS ER 20 MEQ PO TBCR
30.0000 meq | EXTENDED_RELEASE_TABLET | Freq: Four times a day (QID) | ORAL | Status: AC
  Administered 2024-02-26 (×2): 30 meq via ORAL
  Filled 2024-02-26 (×2): qty 1

## 2024-02-26 MED ORDER — DIAZEPAM 5 MG/ML IJ SOLN: 2.5000 mg | Freq: Once | INTRAMUSCULAR | Status: DC

## 2024-02-26 MED ORDER — ARFORMOTEROL TARTRATE 15 MCG/2ML IN NEBU
15.0000 ug | INHALATION_SOLUTION | Freq: Two times a day (BID) | RESPIRATORY_TRACT | Status: DC
  Administered 2024-02-26 – 2024-02-27 (×3): 15 ug via RESPIRATORY_TRACT
  Filled 2024-02-26 (×4): qty 2

## 2024-02-26 MED ORDER — POTASSIUM CHLORIDE CRYS ER 20 MEQ PO TBCR
40.0000 meq | EXTENDED_RELEASE_TABLET | Freq: Once | ORAL | Status: AC
  Administered 2024-02-26: 40 meq via ORAL
  Filled 2024-02-26: qty 2

## 2024-02-26 MED ORDER — LORAZEPAM 1 MG PO TABS
0.5000 mg | ORAL_TABLET | Freq: Once | ORAL | Status: AC | PRN
  Administered 2024-02-26: 0.5 mg via ORAL
  Filled 2024-02-26: qty 1

## 2024-02-26 MED ORDER — TRAMADOL HCL 50 MG PO TABS
50.0000 mg | ORAL_TABLET | Freq: Once | ORAL | Status: DC
  Filled 2024-02-26: qty 1

## 2024-02-26 MED ORDER — POTASSIUM CHLORIDE CRYS ER 20 MEQ PO TBCR
40.0000 meq | EXTENDED_RELEASE_TABLET | Freq: Four times a day (QID) | ORAL | Status: AC
  Administered 2024-02-26 (×2): 40 meq via ORAL
  Filled 2024-02-26 (×2): qty 2

## 2024-02-26 MED ORDER — OXYCODONE HCL 5 MG PO TABS
5.0000 mg | ORAL_TABLET | Freq: Once | ORAL | Status: AC
  Administered 2024-02-26: 5 mg via ORAL
  Filled 2024-02-26: qty 1

## 2024-02-26 MED ORDER — ATORVASTATIN CALCIUM 10 MG PO TABS
10.0000 mg | ORAL_TABLET | Freq: Every day | ORAL | Status: DC
Start: 2024-02-26 — End: 2024-03-01
  Administered 2024-02-26 – 2024-03-01 (×5): 10 mg via ORAL
  Filled 2024-02-26 (×5): qty 1

## 2024-02-26 MED ORDER — INSULIN GLARGINE 100 UNIT/ML ~~LOC~~ SOLN
30.0000 [IU] | Freq: Every day | SUBCUTANEOUS | Status: DC
  Administered 2024-02-26 – 2024-02-29 (×4): 30 [IU] via SUBCUTANEOUS
  Filled 2024-02-26 (×6): qty 0.3

## 2024-02-26 MED ORDER — LIDOCAINE 5 % EX PTCH
1.0000 | MEDICATED_PATCH | CUTANEOUS | Status: DC
  Administered 2024-02-26 – 2024-03-01 (×5): 1 via TRANSDERMAL
  Filled 2024-02-26 (×5): qty 1

## 2024-02-26 MED ORDER — POTASSIUM CHLORIDE 10 MEQ/100ML IV SOLN
10.0000 meq | INTRAVENOUS | Status: AC
Start: 2024-02-26 — End: 2024-02-26
  Administered 2024-02-26 (×4): 10 meq via INTRAVENOUS
  Filled 2024-02-26 (×4): qty 100

## 2024-02-26 MED ORDER — NYSTATIN 100000 UNIT/GM EX POWD
1.0000 | Freq: Two times a day (BID) | CUTANEOUS | Status: DC
Start: 2024-02-26 — End: 2024-03-01
  Administered 2024-02-26 – 2024-03-01 (×7): 1 via TOPICAL
  Filled 2024-02-26: qty 15

## 2024-02-26 MED ORDER — AMIODARONE HCL 200 MG PO TABS
400.0000 mg | ORAL_TABLET | Freq: Every day | ORAL | Status: DC
  Administered 2024-02-26 – 2024-03-01 (×5): 400 mg via ORAL
  Filled 2024-02-26 (×5): qty 2

## 2024-02-26 MED ORDER — RIVAROXABAN 20 MG PO TABS
20.0000 mg | ORAL_TABLET | Freq: Every day | ORAL | Status: DC
  Administered 2024-02-26 – 2024-02-29 (×4): 20 mg via ORAL
  Filled 2024-02-26 (×5): qty 1

## 2024-02-26 MED ORDER — METHOCARBAMOL 500 MG PO TABS
500.0000 mg | ORAL_TABLET | Freq: Three times a day (TID) | ORAL | Status: DC | PRN
  Administered 2024-02-26 – 2024-02-29 (×4): 500 mg via ORAL
  Filled 2024-02-26 (×6): qty 1

## 2024-02-26 MED ORDER — BUDESONIDE 0.25 MG/2ML IN SUSP
0.2500 mg | Freq: Two times a day (BID) | RESPIRATORY_TRACT | Status: DC
  Administered 2024-02-26 – 2024-02-27 (×3): 0.25 mg via RESPIRATORY_TRACT
  Filled 2024-02-26 (×4): qty 2

## 2024-02-26 MED ORDER — METHOCARBAMOL 1000 MG/10ML IJ SOLN
500.0000 mg | Freq: Once | INTRAMUSCULAR | Status: AC
  Administered 2024-02-26: 500 mg via INTRAVENOUS
  Filled 2024-02-26 (×2): qty 10
  Filled 2024-02-26: qty 5

## 2024-02-26 MED ORDER — METHOCARBAMOL 500 MG PO TABS
500.0000 mg | ORAL_TABLET | Freq: Every day | ORAL | Status: DC
  Administered 2024-02-26 – 2024-02-28 (×3): 500 mg via ORAL
  Filled 2024-02-26 (×3): qty 1

## 2024-02-26 MED ORDER — POTASSIUM CHLORIDE 10 MEQ/50ML IV SOLN
10.0000 meq | INTRAVENOUS | Status: DC
Start: 2024-02-26 — End: 2024-02-26

## 2024-02-26 MED ORDER — AMIODARONE HCL IN DEXTROSE 360-4.14 MG/200ML-% IV SOLN: 30.0000 mg/h | INTRAVENOUS | Status: DC

## 2024-02-26 MED ORDER — GABAPENTIN 300 MG PO CAPS
300.0000 mg | ORAL_CAPSULE | Freq: Every day | ORAL | Status: DC
  Administered 2024-02-26 – 2024-02-29 (×4): 300 mg via ORAL
  Filled 2024-02-26 (×4): qty 1

## 2024-02-26 MED ORDER — AMIODARONE HCL 200 MG PO TABS
400.0000 mg | ORAL_TABLET | Freq: Every day | ORAL | Status: DC
Start: 2024-02-26 — End: 2024-02-26

## 2024-02-26 MED ORDER — CLONAZEPAM 0.5 MG PO TABS
0.5000 mg | ORAL_TABLET | Freq: Two times a day (BID) | ORAL | Status: DC
  Administered 2024-02-26 – 2024-03-01 (×9): 0.5 mg via ORAL
  Filled 2024-02-26 (×9): qty 1

## 2024-02-26 NOTE — Progress Notes (Signed)
 EP Progress Note:   Patient ID: [name] MRN: [id number]; DOB: 1965-07-17  Admit date: 02/25/2024 Date of Consult: 02/26/2024  Primary Care Provider: Vicci Barnie NOVAK, MD [facility] Cardiologist: Redell Shallow, MD  [facility] Electrophysiologist:  OLE ONEIDA HOLTS, MD   Patient Profile:   [phone] with NICM/HFrEF, sustained VT s/p 2' prevention LEFT [employer] [place] ICD (DOI 12/23/13) with upgrade to DC ICD (05/15/22), prior VT storm (04/2022) HTN, DM2, prior DVT, and COPD who presents with VT/VF and associated syncope.   Interval Events:   No overnight events. ICD changes made yesterday. S/p repeat amio load IV while electrolytes are being supplemented. S/p K 40 mEq PO x2 yesterday afternoon, repeat K 3.0. S/p Mg 4 gm IV, 1.7 on admit. Ordered repeat now with AM labs. Additional 40 K x2 ordered. Nothing on XR to explain more recent back pain issues.   Still on amio gtt, would transition to amio 400 mg daily until OP f/u. Will then drop back to 200 mg daily if stable and no recurrent VT as OP.   Past Medical History:  Diagnosis Date   AICD (automatic cardioverter/defibrillator) present    Chronic bronchitis (HCC)    get it most q yr (12/23/2013)   Chronic systolic (congestive) heart failure (HCC)    Fracture of left humerus    a. 07/2013.   GERD (gastroesophageal reflux disease)    not at present time   Heart murmur    born w/it    History of renal calculi    Hypertension    Kidney stones    NICM (nonischemic cardiomyopathy) (HCC)    a. 07/2013 Echo: EF 20-25%, diff HK, Gr2 DD, mild MR, mod dil LA/RA. 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    Shockable heart rhythm detected by automated external defibrillator    Syncope    Type II diabetes mellitus (HCC) 2006   Past Surgical History:  Procedure Laterality Date   CARDIAC CATHETERIZATION  08/10/13   CHOLECYSTECTOMY  ~ 2010   IMPLANTABLE  CARDIOVERTER DEFIBRILLATOR IMPLANT  12/23/2013   STJ Fortify ICD implanted by Dr Kelsie for cardiomyopathy and syncope   IMPLANTABLE CARDIOVERTER DEFIBRILLATOR IMPLANT N/A 12/23/2013   Procedure: IMPLANTABLE CARDIOVERTER DEFIBRILLATOR IMPLANT;  Surgeon: Lynwood JONETTA Kelsie, MD;  Location: [facility] CATH LAB;  Service: Cardiovascular;  Laterality: N/A;   INGUINAL HERNIA REPAIR Left 03/01/2018   Procedure: OPEN REPAIR OF LEFT INGUINAL HERNIA WITH MESH;  Surgeon: Tanda Locus, MD;  Location: [facility] ORS;  Service: General;  Laterality: Left;   LEAD REVISION/REPAIR N/A 05/15/2022   Procedure: LEAD REVISION/REPAIR;  Surgeon: HOLTS OLE ONEIDA, MD;  Location: [facility] INVASIVE CV LAB;  Service: Cardiovascular;  Laterality: N/A;   LEFT HEART CATHETERIZATION WITH CORONARY ANGIOGRAM N/A 08/10/2013   Procedure: LEFT HEART CATHETERIZATION WITH CORONARY ANGIOGRAM;  Surgeon: Candyce GORMAN Reek, MD;  Location: [facility] CATH LAB;  Service: Cardiovascular;  Laterality: N/A;   ORIF HUMERUS FRACTURE Left 08/12/2013   Procedure: OPEN REDUCTION INTERNAL FIXATION (ORIF) HUMERAL SHAFT FRACTURE;  Surgeon: Jerona LULLA Sage, MD;  Location: [facility] OR;  Service: Orthopedics;  Laterality: Left;  Open Reduction Internal Fixation Left Humerus   RIGHT/LEFT HEART CATH AND CORONARY ANGIOGRAPHY N/A 05/12/2022   Procedure: RIGHT/LEFT HEART CATH AND CORONARY ANGIOGRAPHY;  Surgeon: Gardenia Led, DO;  Location: [facility] INVASIVE CV LAB;  Service: Cardiovascular;  Laterality: N/A;   URETEROSCOPY     laser for kidney stones  Home Medications:  Prior to Admission medications   Medication Sig Start Date End Date Taking? Authorizing Provider  amiodarone  (PACERONE ) 200 MG tablet Take 1 tablet by mouth once daily 02/08/24  Yes Vicci Barnie NOVAK, MD  atorvastatin  (LIPITOR) 10 MG tablet Take 1 tablet (10 mg total) by mouth daily. 12/14/23  Yes Vicci Barnie NOVAK, MD  benzonatate  (TESSALON ) 200 MG capsule Take 200 mg by mouth 2 (two) times daily as needed for cough. 11/01/23  Yes  [provider]  cetirizine  (EQ ALLERGY RELIEF, CETIRIZINE ,) 10 MG tablet Take 1 tablet (10 mg total) by mouth daily. 09/30/23  Yes Vicci Barnie NOVAK, MD  cholecalciferol  (VITAMIN D3) 25 MCG (1000 UNIT) tablet Take 1,000 Units by mouth in the morning.   Yes [provider]  clonazePAM  (KLONOPIN ) 0.5 MG tablet Take 1 tablet (0.5 mg total) by mouth 2 (two) times daily as needed for anxiety. Patient taking differently: Take 0.5 mg by mouth 2 (two) times daily. 02/18/24 02/17/25 Yes Carvin Arvella HERO, MD  Clotrimazole  1 % OINT Apply to the affected area twice a day Patient taking differently: Apply 1 Application topically 2 (two) times daily as needed (irritation). 09/09/23  Yes Newlin, Enobong, MD  colchicine  0.6 MG tablet 1/2 tab PO Q Mon/Wed/Fri Patient taking differently: Take 0.3 mg by mouth every Monday, Wednesday, and Friday. 12/15/23  Yes Vicci Barnie NOVAK, MD  EPINEPHrine  0.3 mg/0.3 mL IJ SOAJ injection Inject 0.3 mg into the muscle as needed. 01/30/22  Yes Iva Marty Saltness, MD  fluticasone  (FLONASE ) 50 MCG/ACT nasal spray Place 2 sprays into both nostrils daily. 11/16/23  Yes Hedges, Reyes, PA-C  gabapentin  (NEURONTIN ) 300 MG capsule Take 1 capsule (300 mg total) by mouth at bedtime. 11/25/23  Yes Vicci Barnie NOVAK, MD  ibuprofen (ADVIL) 200 MG tablet Take 600 mg by mouth every 6 (six) hours as needed for moderate pain (pain score 4-6).   Yes [provider]  insulin  aspart (NOVOLOG  FLEXPEN) 100 UNIT/ML FlexPen Inject 10-20 Units into the skin 3 (three) times daily before meals. Patient taking differently: Inject 22 Units into the skin 2 (two) times daily. 10/13/23  Yes Regalado, Belkys A, MD  insulin  glargine (LANTUS  SOLOSTAR) 100 UNIT/ML Solostar Pen Inject 30 Units into the skin daily. Patient taking differently: Inject 30 Units into the skin at bedtime. 01/05/24  Yes Elpidio Reyes DEL, MD  loratadine  (CLARITIN ) 10 MG tablet Take 1 tablet (10 mg total) by mouth daily.  11/16/23  Yes Hedges, Reyes, PA-C  methocarbamol  (ROBAXIN ) 500 MG tablet Take 1 tablet (500 mg total) by mouth daily. 11/25/23  Yes Vicci Barnie NOVAK, MD  metolazone  (ZAROXOLYN ) 5 MG tablet Take 1 tablet (5 mg total) by mouth once a week. 11/25/23 02/25/24 Yes Vicci Barnie NOVAK, MD  metoprolol  succinate (TOPROL -XL) 25 MG 24 hr tablet Take 0.5 tablets (12.5 mg total) by mouth daily. 12/14/23  Yes Vicci Barnie NOVAK, MD  nystatin  (MYCOSTATIN /NYSTOP ) powder Apply 1 Application topically 3 (three) times daily. 11/25/23  Yes Vicci Barnie NOVAK, MD  pantoprazole  (PROTONIX ) 40 MG tablet Take 1 tablet (40 mg total) by mouth daily. 01/05/24  Yes Elpidio Reyes DEL, MD  potassium chloride  SA (KLOR-CON  M) 20 MEQ tablet Take 3 tablets (60 mEq total) by mouth daily. 01/19/24  Yes Colletta Manuelita Garre, PA-C  prednisoLONE acetate (PRED FORTE) 1 % ophthalmic suspension Place 1 drop into the left eye at bedtime. 02/09/24  Yes [provider]  rivaroxaban  (XARELTO ) 20 MG TABS tablet Take 1 tablet (  20 mg total) by mouth daily with supper. 02/05/24  Yes Vicci Barnie NOVAK, MD  sertraline  (ZOLOFT ) 50 MG tablet Take 1.5 tablets (75 mg total) by mouth in the morning. 11/19/23 11/18/24 Yes Carvin Arvella HERO, MD  SYMBICORT  160-4.5 MCG/ACT inhaler Inhale 2 puffs into the lungs in the morning and at bedtime. 08/28/23  Yes [provider]  tamsulosin  (FLOMAX ) 0.4 MG CAPS capsule Take 2 capsules (0.8 mg total) by mouth daily. 11/25/23  Yes Vicci Barnie NOVAK, MD  torsemide  (DEMADEX ) 20 MG tablet Take 2 tablets (40 mg total) by mouth daily. 11/04/23  Yes Milford, Harlene HERO, FNP  acetaminophen -codeine  (TYLENOL  #3) 300-30 MG tablet Take 1 tablet by mouth every 8 (eight) hours as needed for moderate pain (pain score 4-6) or severe pain (pain score 7-10). Patient not taking: Reported on 02/25/2024 02/18/24   Williams, Megan E, NP  Continuous Glucose Sensor (FREESTYLE LIBRE 2 PLUS SENSOR) MISC Change sensor every 15 days. 02/17/24    Vicci Barnie NOVAK, MD  Insulin  Pen Needle (TECHLITE PEN NEEDLES) 32G X 4 MM MISC Use to inject insulin  into the skin 3 times per day. 10/13/23   Regalado, Belkys A, MD  latanoprost (XALATAN) 0.005 % ophthalmic solution Place 1 drop into both eyes at bedtime. Patient not taking: Reported on 02/25/2024 02/16/24   [provider]    Inpatient Medications: Scheduled Meds:  Chlorhexidine  Gluconate Cloth  6 each Topical Daily   insulin  aspart  0-20 Units Subcutaneous TID PC & HS   lidocaine   1 patch Transdermal Q24H   metoprolol  succinate  12.5 mg Oral Daily   pantoprazole   40 mg Oral Daily   sertraline   75 mg Oral q AM   sodium chloride  flush  3 mL Intravenous Q12H   tamsulosin   0.8 mg Oral Daily   Continuous Infusions:  sodium chloride  Stopped (02/25/24 1922)   amiodarone  30 mg/hr (02/26/24 0416)   heparin  2,100 Units/hr (02/26/24 0300)   PRN Meds: sodium chloride , acetaminophen , ALPRAZolam , ondansetron  (ZOFRAN ) IV, mouth rinse, sodium chloride  flush  Allergies:    Allergies  Allergen Reactions   Bee Venom Anaphylaxis   Trulicity  [Dulaglutide ] Anaphylaxis and Diarrhea    Extra dose of Trulicity  caused cardiac arrest. Patient experience onset of diarrhea with using this medication - cleared after med stopped.   Social History:   Social History   Socioeconomic History   Marital status: Single    Spouse name: Not on file   Number of children: Not on file   Years of education: Not on file   Highest education level: Not on file  Occupational History   Occupation: Disabled  Tobacco Use   Smoking status: Former    Current packs/day: 0.00    Average packs/day: 1 pack/day for 28.0 years (28.0 ttl pk-yrs)    Types: Cigarettes    Start date: 06/03/1986    Quit date: 06/03/2014    Years since quitting: 9.7   Smokeless tobacco: Never  Vaping Use   Vaping status: Never Used  Substance and Sexual Activity   Alcohol use: Not Currently   Drug use: Yes    Types: Marijuana     Comment: occasionally;   Sexual activity: Yes  Other Topics Concern   Not on file  Social History Narrative   Pt lives with his brother    Social Drivers of Health   Financial Resource Strain: Low Risk  (05/25/2023)   Overall Financial Resource Strain (CARDIA)    Difficulty of Paying Living Expenses: Not  hard at all  Recent Concern: Financial Resource Strain - High Risk (03/24/2023)   Overall Financial Resource Strain (CARDIA)    Difficulty of Paying Living Expenses: Hard  Food Insecurity: No Food Insecurity (02/25/2024)   Hunger Vital Sign    Worried About Running Out of Food in the Last Year: Never true    Ran Out of Food in the Last Year: Never true  Transportation Needs: No Transportation Needs (02/25/2024)   PRAPARE - Administrator, Civil Service (Medical): No    Lack of Transportation (Non-Medical): No  Physical Activity: Inactive (05/25/2023)   Exercise Vital Sign    Days of Exercise per Week: 0 days    Minutes of Exercise per Session: 0 min  Stress: Stress Concern Present (05/25/2023)   [employer] of Occupational Health - Occupational Stress Questionnaire    Feeling of Stress : Rather much  Social Connections: Moderately Integrated (10/08/2023)   Social Connection and Isolation Panel    Frequency of Communication with Friends and Family: More than three times a week    Frequency of Social Gatherings with Friends and Family: More than three times a week    Attends Religious Services: More than 4 times per year    Active Member of [employer] or Organizations: Yes    Attends Banker Meetings: 1 to 4 times per year    Marital Status: Never married  Intimate Partner Violence: Not At Risk (02/25/2024)   Humiliation, Afraid, Rape, and Kick questionnaire    Fear of Current or Ex-Partner: No    Emotionally Abused: No    Physically Abused: No    Sexually Abused: No    Family History: Reviewed   Family History  Problem Relation Age of Onset    Diabetes Father    Arthritis Father    Hypertension Father    Diabetes Mother    Arthritis Mother    Hypertension Mother    Hypertension Brother     ROS:  Review of Systems: [y] = yes, [ ]  = no      General: Weight gain [ ] ; Weight loss [ ] ; Anorexia [ ] ; Fatigue [ ] ; Fever [ ] ; Chills [ ] ; Weakness [ ]    Cardiac: Chest pain/pressure [ ] ; Resting SOB [ ] ; Exertional SOB [ ] ; Orthopnea [ ] ; Pedal Edema [ ] ; Palpitations [ ] ; Syncope [ ] ; Presyncope [ ] ; Paroxysmal nocturnal dyspnea [ ]    Pulmonary: Cough [ ] ; Wheezing [ ] ; Hemoptysis [ ] ; Sputum [ ] ; Snoring [ ]    GI: Vomiting [ ] ; Dysphagia [ ] ; Melena [ ] ; Hematochezia [ ] ; Heartburn [ ] ; Abdominal pain [ ] ; Constipation [ ] ; Diarrhea [ ] ; BRBPR [ ]    GU: Hematuria [ ] ; Dysuria [ ] ; Nocturia [ ]  Vascular: Pain in legs with walking [ ] ; Pain in feet with lying flat [ ] ; Non-healing sores [ ] ; Stroke [ ] ; TIA [ ] ; Slurred speech [ ] ;   Neuro: Headaches [ ] ; Vertigo [ ] ; Seizures [ ] ; Paresthesias [ ] ;Blurred vision [ ] ; Diplopia [ ] ; Vision changes [ ]    Ortho/Skin: Arthritis [ ] ; Joint pain [ ] ; Muscle pain [ ] ; Joint swelling [ ] ; Back Pain [x] ; Rash [ ]    Psych: Depression [ ] ; Anxiety [ ]    Heme: Bleeding problems [ ] ; Clotting disorders [ ] ; Anemia [ ]    Endocrine: Diabetes [ ] ; Thyroid  dysfunction [ ]    Physical Exam/Data:   Vitals:   02/26/24  0315 02/26/24 0330 02/26/24 0345 02/26/24 0400  BP:      Pulse: 68 67 68 75  Resp: (!) 23 20 (!) 23 16  Temp:    97.8 F (36.6 C)  TempSrc:    Axillary  SpO2: 98% 99% 97% 92%  Weight:      Height:        Intake/Output Summary (Last 24 hours) at 02/26/2024 0522 Last data filed at 02/26/2024 0300 Gross per 24 hour  Intake 688.62 ml  Output 1275 ml  Net -586.38 ml      02/25/2024   10:39 AM 02/16/2024    3:44 PM 02/05/2024    3:38 PM  Last 3 Weights  Weight (lbs) 279 lb 15.8 oz 282 lb 285 lb  Weight (kg) 127 kg 127.914 kg 129.275 kg     Body mass index is 37.97 kg/m.   General:  Well nourished, well developed, in no acute distress Cardiac:  normal S1, S2; RRR; no murmur  Lungs:  clear to auscultation bilaterally, no wheezing, rhonchi or rales  Abd: soft, nontender, no hepatomegaly  Ext: no edema  Telemetry:  Telemetry was personally reviewed and demonstrates: SR 60s, infrequent PVCs. Occasional NSVT, no more than 5 beats.   Relevant CV Studies: [name] recent   Laboratory Data:  High Sensitivity Troponin:   Recent Labs  Lab 02/25/24 1144 02/25/24 1329  TROPONINIHS 24* 23*     Chemistry Recent Labs  Lab 02/25/24 1144 02/25/24 1156 02/25/24 2350  NA 140 138 134*  K 3.0* 2.8* 3.0*  CL 89* 91* 89*  CO2 29  --  28  GLUCOSE 238* 237* 326*  BUN 74* 73* 72*  CREATININE 4.14* 4.40* 4.28*  CALCIUM  9.4  --  9.0  GFRNONAA 16*  --  15*  ANIONGAP 22*  --  17*    Recent Labs  Lab 02/25/24 1144  PROT 8.0  ALBUMIN  4.1  AST 24  ALT 22  ALKPHOS 64  BILITOT 1.1   Hematology Recent Labs  Lab 02/25/24 1144 02/25/24 1156 02/25/24 2350  WBC 8.1  --  7.1  RBC 4.67  --  4.29  HGB 13.9 14.3 12.9*  HCT 42.4 42.0 38.4*  MCV 90.8  --  89.5  MCH 29.8  --  30.1  MCHC 32.8  --  33.6  RDW 14.2  --  14.1  PLT 308  --  272   BNPNo results for input(s): BNP, PROBNP in the last 168 hours.  DDimer No results for input(s): DDIMER in the last 168 hours.  Radiology/Studies:  DG Shoulder Right Result Date: 02/25/2024 CLINICAL DATA:  Right shoulder pain after fall last night. EXAM: RIGHT SHOULDER - 2+ VIEW COMPARISON:  [name] Available. FINDINGS: There is no evidence of fracture or dislocation. Mild degenerative disease is seen involving the right glenohumeral joint. Soft tissues are unremarkable. 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 No acute abnormality seen. Electronically Signed   By: Lynwood [name] Raddle M.D.   On: 02/25/2024 12:09   DG Chest 1 View Result Date: 02/25/2024 CLINICAL DATA:  Chest pain, syncope. EXAM:  CHEST  1 VIEW COMPARISON:  December 25, 2023. FINDINGS: Stable cardiomediastinal silhouette. Left-sided defibrillator is unchanged. Hypoinflation of the lungs is noted with minimal bibasilar subsegmental atelectasis. Bony thorax is unremarkable. IMPRESSION: Hypoinflation of the lungs with minimal bibasilar subsegmental atelectasis. Electronically Signed   By: Lynwood [name] Raddle M.D.   On: 02/25/2024 12:06   DG Lumbar Spine Complete Result Date: 02/25/2024  CLINICAL DATA:  Lower back pain after fall last night. EXAM: LUMBAR SPINE - COMPLETE 4+ VIEW COMPARISON:  May 27, 2023. FINDINGS: No fracture or spondylolisthesis is noted. Moderate degenerative disc disease is noted at L2-3. Anterior osteophyte formation is noted at multiple other levels of the lumbar spine. IMPRESSION: Multilevel degenerative changes as described above. No acute abnormality seen. Electronically Signed   By: Lynwood [name] Raddle M.D.   On: 02/25/2024 12:05   Assessment and Plan:  34M with NICM/HFrEF, sustained VT s/p 2' prevention LEFT [employer] [place] ICD (DOI 12/23/13) with upgrade to [place] ICD (05/15/22), prior VT storm (04/2022) HTN, DM2, prior DVT, and COPD who presents with VT/VF and associated syncope in the setting of electrolyte derangements and diarrhea.   PMVT/VF No recurrent sustained VT overnight. Device settings changed as documented yesterday. K still low, ordered K 40 mEq x2 this morning, ordered repeat Mg s/p supplementation. Can switch from amio IV to 400 mg daily (increase OP dose from 200 mg daily->400 mg daily) and will coordinate f/u. Can reduce back as OP once stable. Has been taking K 60 mEq daily and 100 mEq on Monday, rec'd he got to 40 mEq bid for better absorption. Add Mg 400 mEq daily as well.   NICM No changes currently. No signs of decompensated HF.   Back pain Chronic, seeing pain management Monday. Ordered x1 oxycodone  now, has been an issue before when in the hospital/bed.   For questions or updates, please  contact  HeartCare Please consult [email or web address] for contact info under   Signed, Donnice DELENA Primus, MD  02/26/2024 5:22 AM

## 2024-02-26 NOTE — CV Procedure (Signed)
  Device system confirmed to be MRI conditional, with implant date > 6 weeks ago, and no evidence of abandoned or epicardial leads in review of most recent CXR  Device last cleared by EP Provider: Charlies Arthur 02/26/24  Clearance is good through for 1 year as long as parameters remain stable at time of check. If pt undergoes a cardiac device procedure during that time, they should be re-cleared.   Tachy-therapies to be programmed off if applicable with device back to pre-MRI settings after completion of exam.  [employer] will be present for programming for the MRI.   Rocky Catalan, RT  02/26/2024 9:34 AM

## 2024-02-26 NOTE — Progress Notes (Signed)
 Advanced Heart Failure Consult Note   PCP:  Vicci Barnie NOVAK, MD  PCP-Cardiology: Redell Shallow, MD     CC: Polymorphic VT/VF   Interval hx:   -No further VT -Anxious  Objective:    Vital Signs:   Temp:  [97.7 F (36.5 C)-97.8 F (36.6 C)] 97.7 F (36.5 C) (09/19 0758) Pulse Rate:  [61-81] 68 (09/19 1000) Resp:  [13-26] 18 (09/19 1000) BP: (93-146)/(50-93) 121/68 (09/19 1000) SpO2:  [89 %-99 %] 95 % (09/19 1000) Weight:  [124.6 kg] 124.6 kg (09/19 0500) Last BM Date : 02/26/24 Filed Weights   02/25/24 1039 02/26/24 0500  Weight: 127 kg 124.6 kg     Physical Exam     General:  NAD Cor: RRR Lungs: normal work of breathing Abdomen: nontender Extremities: No edema   Telemetry   SR with frequent PVCs  Labs     Basic Metabolic Panel: Recent Labs  Lab 02/25/24 1144 02/25/24 1156 02/25/24 2350 02/26/24 0733  NA 140 138 134* 136  K 3.0* 2.8* 3.0* 3.2*  CL 89* 91* 89* 92*  CO2 29  --  28 28  GLUCOSE 238* 237* 326* 271*  BUN 74* 73* 72* 68*  CREATININE 4.14* 4.40* 4.28* 3.73*  CALCIUM  9.4  --  9.0 8.9  MG 1.7  --   --  2.3    Liver Function Tests: Recent Labs  Lab 02/25/24 1144  AST 24  ALT 22  ALKPHOS 64  BILITOT 1.1  PROT 8.0  ALBUMIN  4.1   No results for input(s): LIPASE, AMYLASE in the last 168 hours. No results for input(s): AMMONIA in the last 168 hours.  CBC: Recent Labs  Lab 02/25/24 1144 02/25/24 1156 02/25/24 2350  WBC 8.1  --  7.1  HGB 13.9 14.3 12.9*  HCT 42.4 42.0 38.4*  MCV 90.8  --  89.5  PLT 308  --  272    Cardiac Enzymes: No results for input(s): CKTOTAL, CKMB, CKMBINDEX, TROPONINI in the last 168 hours.  BNP: BNP (last 3 results) Recent Labs    10/23/23 1105 12/19/23 1756 01/18/24 1558  BNP 25.5 27.0 23.3    ProBNP (last 3 results) No results for input(s): PROBNP in the last 8760 hours.   CBG: Recent Labs  Lab 02/25/24 1146 02/25/24 1626 02/25/24 1747 02/25/24 2106  02/26/24 0929  GLUCAP 233* 372* 340* 316* 298*    Coagulation Studies: No results for input(s): LABPROT, INR in the last 72 hours.  Imaging: ECHOCARDIOGRAM LIMITED Result Date: 02/26/2024    ECHOCARDIOGRAM LIMITED REPORT   Patient Name:   [name] Date of Exam: 02/26/2024 Medical Rec #:  [id number]      Height:       72.0 in Accession #:    [id number]     Weight:       274.7 lb Date of Birth:  1965/12/17      BSA:          2.438 m Patient Age:    58 years       BP:           111/61 mmHg Patient Gender: M              HR:           69 bpm. Exam Location:  Inpatient Procedure: Limited Echo, Limited Color Doppler and Cardiac Doppler (Both            Spectral and Color Flow Doppler were utilized during procedure).  Indications:    Congestive Heart Failure I50.9  History:        Patient has prior history of Echocardiogram examinations, most                 recent 12/22/2023. CHF, Defibrillator, CKD; Risk                 Factors:Diabetes, Dyslipidemia and Former Smoker.  Sonographer:    Koleen Popper RDCS Referring Phys: [phone] AMY D CLEGG  Sonographer Comments: Patient is obese. Image acquisition challenging due to patient body habitus. IMPRESSIONS  1. Only off axis foreshortened apical views available unable to fully evaluate RWMA;s. Left ventricular ejection fraction, by estimation, is 45 to 50%. The left ventricle has mildly decreased function. The left ventricular internal cavity size was mildly dilated. There is severe left ventricular hypertrophy. Left ventricular diastolic parameters are consistent with Grade I diastolic dysfunction (impaired relaxation).  2. Right ventricular systolic function is normal. The right ventricular size is normal.  3. Left atrial size was mildly dilated.  4. The mitral valve is abnormal. Trivial mitral valve regurgitation.  5. The inferior vena cava is dilated in size with >50% respiratory variability, suggesting right atrial pressure of 8 mmHg. FINDINGS  Left  Ventricle: Only off axis foreshortened apical views available unable to fully evaluate RWMA;s. Left ventricular ejection fraction, by estimation, is 45 to 50%. The left ventricle has mildly decreased function. The left ventricular internal cavity size was mildly dilated. There is severe left ventricular hypertrophy. Left ventricular diastolic parameters are consistent with Grade I diastolic dysfunction (impaired relaxation). Right Ventricle: The right ventricular size is normal. Right ventricular systolic function is normal. Left Atrium: Left atrial size was mildly dilated. Pericardium: There is no evidence of pericardial effusion. Mitral Valve: The mitral valve is abnormal. There is mild thickening of the mitral valve leaflet(s). Trivial mitral valve regurgitation. Aorta: The aortic root is normal in size and structure. Venous: The inferior vena cava is dilated in size with greater than 50% respiratory variability, suggesting right atrial pressure of 8 mmHg. Additional Comments: Spectral Doppler performed. Color Doppler performed.  LEFT VENTRICLE PLAX 2D LVIDd:         6.60 cm   Diastology LVIDs:         4.70 cm   LV e' medial:    5.77 cm/s LV PW:         1.50 cm   LV E/e' medial:  9.2 LV IVS:        1.60 cm   LV e' lateral:   5.87 cm/s LVOT diam:     2.50 cm   LV E/e' lateral: 9.0 LVOT Area:     4.91 cm  IVC IVC diam: 2.20 cm LEFT ATRIUM         Index LA diam:    4.20 cm 1.72 cm/m   AORTA Ao Root diam: 3.50 cm MITRAL VALVE MV Area (PHT): 3.42 cm    SHUNTS MV Decel Time: 222 msec    Systemic Diam: 2.50 cm MV E velocity: 52.90 cm/s MV A velocity: 74.90 cm/s MV E/A ratio:  0.71 Maude Emmer MD Electronically signed by Maude Emmer MD Signature Date/Time: 02/26/2024/9:37:40 AM    Final    DG Shoulder Right Result Date: 02/25/2024 CLINICAL DATA:  Right shoulder pain after fall last night. EXAM: RIGHT SHOULDER - 2+ VIEW COMPARISON:  None Available. FINDINGS: There is no evidence of fracture or dislocation. Mild  degenerative disease is seen involving the right glenohumeral  joint. Soft tissues are unremarkable. 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 No acute abnormality seen. Electronically Signed   By: Lynwood [name] Raddle M.D.   On: 02/25/2024 12:09   DG Chest 1 View Result Date: 02/25/2024 CLINICAL DATA:  Chest pain, syncope. EXAM: CHEST  1 VIEW COMPARISON:  December 25, 2023. FINDINGS: Stable cardiomediastinal silhouette. Left-sided defibrillator is unchanged. Hypoinflation of the lungs is noted with minimal bibasilar subsegmental atelectasis. Bony thorax is unremarkable. IMPRESSION: Hypoinflation of the lungs with minimal bibasilar subsegmental atelectasis. Electronically Signed   By: Lynwood [name] Raddle M.D.   On: 02/25/2024 12:06   DG Lumbar Spine Complete Result Date: 02/25/2024 CLINICAL DATA:  Lower back pain after fall last night. EXAM: LUMBAR SPINE - COMPLETE 4+ VIEW COMPARISON:  May 27, 2023. FINDINGS: No fracture or spondylolisthesis is noted. Moderate degenerative disc disease is noted at L2-3. Anterior osteophyte formation is noted at multiple other levels of the lumbar spine. IMPRESSION: Multilevel degenerative changes as described above. No acute abnormality seen. Electronically Signed   By: Lynwood [name] Raddle M.D.   On: 02/25/2024 12:05     Patient Profile  58 y.o. 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   Admitted after a syncopal episode d/t polymorphic VT/VF  Assessment/Plan   1. Polymorphic VT/VF - Hx VT storm in 11/23 2/2 metabolic abnormalities in setting of gluteal abscess - Has been maintained on po amiodarone  - Now with polymorphic VT >>> VF s/p ATP with no change. Spontaneously terminated. - Seen by EP today: repleted potassium this AM; increasing PO amio to 400mg  daily.  - Transfer to floor. He can likely be discharged tomorrow.  - Plan for cardiac MRI; although, his VT has always occurred with  episodes of hypokalemia.   2. Heart failure with reduced EF>>now HFmrEF - Nonischemic as demonstrated by coronary angiography 12/23; no family history.  - Echo 07/25: EF 40-45%, RV okay - NYHA IIIb at baseline. Volume looks good on exam. Suspect he may be on the dry side with bump in BUN and Scr. Hold home diuretics today.  - Off Entresto  and spiro with CKD - Off SGLT2i w/ groin cellulitis - Continue Toprol  XL 12.5 mg daily  3. DM II - Uncontrolled, last A1c 8.5 - Avoid SGLT2i with hx of groin infections - Per PCP   4. AKI on CKD IV  - Baseline SCr 2.5-2.7 - sCr down to 3.7 from 4.28.  - Needs to stop using NSAIDs - Euvolemic; hold diuretics.   5. Hx DVT - Recurrent - Recently found to have RLE DVT while on Eliquis  - Now on Xarelto  20 mg daily. Will discuss renal dosing with pharmD - has f/u with hematology   6. OSA - severe by sleep study - just received new machine but has been unable to tolerate CPAP so far   7. COPD - Follows with Dr. Meade - On Symbicort    Ria Commander, DO 02/26/2024, 11:33 AM  Advanced Heart Failure Team Pager [phone] (M-F; 7a - 5p)  Please contact [facility] for night-coverage after hours (4p -7a ) and weekends on [email or web address]

## 2024-02-26 NOTE — Progress Notes (Signed)
  Echocardiogram 2D Echocardiogram has been performed.  Koleen KANDICE Popper, RDCS 02/26/2024, 8:25 AM

## 2024-02-26 NOTE — Inpatient Diabetes Management (Signed)
 Inpatient Diabetes Program Recommendations  AACE/ADA: New Consensus Statement on Inpatient Glycemic Control   Target Ranges:  Prepandial:   less than 140 mg/dL      Peak postprandial:   less than 180 mg/dL (1-2 hours)      Critically ill patients:  140 - 180 mg/dL    Latest Reference Range & Units 12/15/23 16:08  Hemoglobin A1C 4.0 - 5.6 % 8.5 !    Latest Reference Range & Units 02/25/24 11:46 02/25/24 16:26 02/25/24 17:47 02/25/24 21:06  Glucose-Capillary 70 - 99 mg/dL 766 (H) 627 (H) 659 (H) 316 (H)    Review of Glycemic Control  Diabetes history: DM2 Outpatient Diabetes medications: FreeStyle Libre3, Lantus  30 units daily, and Novolog  22 units BID.  Current orders for Inpatient glycemic control: Lantus  30 units daily and Novolog  0-20 units TID and at bedtime.   Inpatient Diabetes Program Recommendations: Please consider adding Novolog  5 units TID with meals.   Thanks,  Lavanda Search, RN, MSN, [facility]  Inpatient Diabetes Coordinator  Pager [phone] (8a-5p)

## 2024-02-26 NOTE — Progress Notes (Signed)
Remote ICD Transmission.

## 2024-02-26 NOTE — Progress Notes (Signed)
 ANTICOAGULATION CONSULT NOTE  Pharmacy Consult for heparin  Indication: Hx DVT on Xarelto  PTA  Allergies  Allergen Reactions   Bee Venom Anaphylaxis   Trulicity  [Dulaglutide ] Anaphylaxis and Diarrhea    Extra dose of Trulicity  caused cardiac arrest. Patient experience onset of diarrhea with using this medication - cleared after med stopped.    Patient Measurements: Height: 6' (182.9 cm) Weight: 127 kg (279 lb 15.8 oz) IBW/kg (Calculated) : 77.6 Heparin  Dosing Weight: 106 kg  Vital Signs: Temp: 97.8 F (36.6 C) (09/18 2300) Temp Source: Oral (09/18 2300) BP: 132/66 (09/18 2300) Pulse Rate: 69 (09/18 2300)  Labs: Recent Labs    02/25/24 1144 02/25/24 1156 02/25/24 1329 02/25/24 2350  HGB 13.9 14.3  --  12.9*  HCT 42.4 42.0  --  38.4*  PLT 308  --   --  272  APTT  --   --   --  60*  HEPARINUNFRC  --   --   --  >1.10*  CREATININE 4.14* 4.40*  --  4.28*  TROPONINIHS 24*  --  23*  --     Estimated Creatinine Clearance: 25.6 mL/min (A) (by C-G formula based on SCr of 4.28 mg/dL (H)).  Medical History: Past Medical History:  Diagnosis Date   AICD (automatic cardioverter/defibrillator) present    Chronic bronchitis (HCC)    get it most q yr (12/23/2013)   Chronic systolic (congestive) heart failure (HCC)    Fracture of left humerus    a. 07/2013.   GERD (gastroesophageal reflux disease)    not at present time   Heart murmur    born w/it    History of renal calculi    Hypertension    Kidney stones    NICM (nonischemic cardiomyopathy) (HCC)    a. 07/2013 Echo: EF 20-25%, diff HK, Gr2 DD, mild MR, mod dil LA/RA. 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  Pharmacy consulted for heparin  dosing.  PTA Xarelto  - last dose 9/17 PM.    CBC stable - Hgb 13.9, Plt 308.   PTT 60, heparin  level >1.1. No issue per Rn. We will increase and check PTT in 8 hrs.  Goal of Therapy:  Heparin  level 0.3-0.7 units/ml aPTT 66-102 seconds Monitor platelets by anticoagulation protocol: Yes   Plan:  Increase heparin  infusion 2100 units/hr, no bolus w/ recent DOAC  8 hour aPTT Daily CBC, anti-Xa level, aPTT until correlating  Monitor for s/sx of bleeding  Sergio Batch, PharmD, BCIDP, AAHIVP, CPP Infectious Disease Pharmacist 02/26/2024 12:45 AM

## 2024-02-26 NOTE — Progress Notes (Addendum)
 ANTICOAGULATION CONSULT NOTE  Pharmacy Consult for heparin  Indication: Hx DVT on Xarelto  PTA  Allergies  Allergen Reactions   Bee Venom Anaphylaxis   Trulicity  [Dulaglutide ] Anaphylaxis and Diarrhea    Extra dose of Trulicity  caused cardiac arrest. Patient experience onset of diarrhea with using this medication - cleared after med stopped.    Patient Measurements: Height: 6' (182.9 cm) Weight: 124.6 kg (274 lb 11.2 oz) IBW/kg (Calculated) : 77.6 Heparin  Dosing Weight: 106 kg  Vital Signs: Temp: 97.7 F (36.5 C) (09/19 0758) Temp Source: Oral (09/19 0758) BP: 111/61 (09/19 0745) Pulse Rate: 66 (09/19 0745)  Labs: Recent Labs    02/25/24 1144 02/25/24 1156 02/25/24 1329 02/25/24 2350 02/26/24 0733  HGB 13.9 14.3  --  12.9*  --   HCT 42.4 42.0  --  38.4*  --   PLT 308  --   --  272  --   APTT  --   --   --  60* 101*  HEPARINUNFRC  --   --   --  >1.10*  --   CREATININE 4.14* 4.40*  --  4.28* 3.73*  TROPONINIHS 24*  --  23*  --   --     Estimated Creatinine Clearance: 29.1 mL/min (A) (by C-G formula based on SCr of 3.73 mg/dL (H)).  Medical History: Past Medical History:  Diagnosis Date   AICD (automatic cardioverter/defibrillator) present    Chronic bronchitis (HCC)    get it most q yr (12/23/2013)   Chronic systolic (congestive) heart failure (HCC)    Fracture of left humerus    a. 07/2013.   GERD (gastroesophageal reflux disease)    not at present time   Heart murmur    born w/it    History of renal calculi    Hypertension    Kidney stones    NICM (nonischemic cardiomyopathy) (HCC)    a. 07/2013 Echo: EF 20-25%, diff HK, Gr2 DD, mild MR, mod dil LA/RA. 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  Pharmacy consulted for heparin  dosing.  PTA Xarelto  - last dose 9/17 PM.    9/19 AM: aPTT 101 sec, therapeutic on 2100 units/hr. No issues with infusion running or signs of bleeding noted. CBC stable - Hgb 12.9, Plt 272. Discussed with AHF team, plan to continue heparin  through this evening then resume his home Xarelto  20 mg PO daily. Noted patient's CrCl using total body weight is ~37 mL/min.   Goal of Therapy:  Heparin  level 0.3-0.7 units/ml aPTT 66-102 seconds Monitor platelets by anticoagulation protocol: Yes   Plan:  Continue heparin  infusion at 2100 units/hr until 1700 on 9/19 Start Xarelto  20 mg PO daily at 1700 on 9/19  Morna Breach, PharmD PGY2 Cardiology Pharmacy Resident 02/26/2024 9:09 AM

## 2024-02-27 DIAGNOSIS — I472 Ventricular tachycardia, unspecified: Secondary | ICD-10-CM | POA: Diagnosis not present

## 2024-02-27 LAB — GLUCOSE, CAPILLARY
Glucose-Capillary: 173 mg/dL — ABNORMAL HIGH (ref 70–99)
Glucose-Capillary: 169 mg/dL — ABNORMAL HIGH (ref 70–99)
Glucose-Capillary: 233 mg/dL — ABNORMAL HIGH (ref 70–99)
Glucose-Capillary: 285 mg/dL — ABNORMAL HIGH (ref 70–99)
Glucose-Capillary: 300 mg/dL — ABNORMAL HIGH (ref 70–99)

## 2024-02-27 LAB — CBC
HCT: 36 % — ABNORMAL LOW (ref 39.0–52.0)
Hemoglobin: 12.1 g/dL — ABNORMAL LOW (ref 13.0–17.0)
MCH: 30.3 pg (ref 26.0–34.0)
MCHC: 33.6 g/dL (ref 30.0–36.0)
MCV: 90 fL (ref 80.0–100.0)
Platelets: 270 K/uL (ref 150–400)
RBC: 4 MIL/uL — ABNORMAL LOW (ref 4.22–5.81)
RDW: 13.8 % (ref 11.5–15.5)
WBC: 6.6 K/uL (ref 4.0–10.5)
nRBC: 0 % (ref 0.0–0.2)

## 2024-02-27 LAB — BASIC METABOLIC PANEL WITH GFR
Anion gap: 12 (ref 5–15)
BUN: 54 mg/dL — ABNORMAL HIGH (ref 6–20)
CO2: 29 mmol/L (ref 22–32)
Calcium: 9.1 mg/dL (ref 8.9–10.3)
Chloride: 95 mmol/L — ABNORMAL LOW (ref 98–111)
Creatinine, Ser: 2.96 mg/dL — ABNORMAL HIGH (ref 0.61–1.24)
GFR, Estimated: 24 mL/min — ABNORMAL LOW (ref >60)
Glucose, Bld: 265 mg/dL — ABNORMAL HIGH (ref 70–99)
Potassium: 3.6 mmol/L (ref 3.5–5.1)
Sodium: 136 mmol/L (ref 135–145)

## 2024-02-27 LAB — PHOSPHORUS: Phosphorus: 2.4 mg/dL — ABNORMAL LOW (ref 2.5–4.6)

## 2024-02-27 LAB — MAGNESIUM: Magnesium: 2.4 mg/dL (ref 1.7–2.4)

## 2024-02-27 MED ORDER — POTASSIUM CHLORIDE CRYS ER 20 MEQ PO TBCR: 30.0000 meq | EXTENDED_RELEASE_TABLET | ORAL | Status: DC

## 2024-02-27 MED ORDER — POTASSIUM CHLORIDE CRYS ER 20 MEQ PO TBCR
40.0000 meq | EXTENDED_RELEASE_TABLET | Freq: Two times a day (BID) | ORAL | Status: DC
  Administered 2024-02-27 – 2024-02-28 (×2): 40 meq via ORAL
  Filled 2024-02-27 (×2): qty 2

## 2024-02-27 MED ORDER — POTASSIUM CHLORIDE CRYS ER 20 MEQ PO TBCR
40.0000 meq | EXTENDED_RELEASE_TABLET | Freq: Once | ORAL | Status: AC
  Administered 2024-02-27: 40 meq via ORAL
  Filled 2024-02-27: qty 2

## 2024-02-27 NOTE — Progress Notes (Signed)
  Progress Note  Patient Name: [name] Date of Encounter: 02/27/2024 [facility] Cardiologist: Redell Shallow, MD   Interval Summary   Feeling well today.  No further ventricular tachycardia overnight.  Was transition to p.o. amiodarone  yesterday.  Vital Signs Vitals:   02/27/24 0500 02/27/24 0806 02/27/24 0842 02/27/24 0844  BP: (!) 114/58 116/70    Pulse: 67 67 67   Resp: 17 18 14    Temp:  98 F (36.7 C)    TempSrc:  Oral    SpO2: 96% 96% 97% 96%  Weight: 126.1 kg     Height:        Intake/Output Summary (Last 24 hours) at 02/27/2024 1015 Last data filed at 02/27/2024 9191 Gross per 24 hour  Intake 782.72 ml  Output 500 ml  Net 282.72 ml      02/27/2024    5:00 AM 02/26/2024    5:00 AM 02/25/2024   10:39 AM  Last 3 Weights  Weight (lbs) 278 lb 274 lb 11.2 oz 279 lb 15.8 oz  Weight (kg) 126.1 kg 124.603 kg 127 kg      Telemetry/ECG  Sinus rhythm- Personally Reviewed  Physical Exam  GEN: No acute distress.   Neck: No JVD Cardiac: RRR, no murmurs, rubs, or gallops.  Respiratory: Clear to auscultation bilaterally. GI: Soft, nontender, non-distended  MS: No edema  Assessment & Plan  Polymorphic VT/VF: Defibrillator settings have been adjusted.  He has been switched to p.o. amiodarone .  Eniya Cannady continue at 400 mg twice daily for 2 weeks followed by 200 mg daily.  If he has no further VT overnight tonight, Myalee Stengel likely plan for discharge tomorrow.  Chronic systolic heart failure: Due to nonischemic cardiomyopathy.  No signs of volume overload.      For questions or updates, please contact [facility] Please consult [email or web address] for contact info under         Signed, Alysabeth Scalia Gladis Norton, MD

## 2024-02-28 ENCOUNTER — Other Ambulatory Visit: Payer: Self-pay | Admitting: Physician Assistant

## 2024-02-28 ENCOUNTER — Inpatient Hospital Stay (HOSPITAL_COMMUNITY)

## 2024-02-28 DIAGNOSIS — I472 Ventricular tachycardia, unspecified: Secondary | ICD-10-CM | POA: Diagnosis not present

## 2024-02-28 DIAGNOSIS — N1832 Chronic kidney disease, stage 3b: Secondary | ICD-10-CM

## 2024-02-28 DIAGNOSIS — M5136 Other intervertebral disc degeneration, lumbar region with discogenic back pain only: Secondary | ICD-10-CM | POA: Diagnosis not present

## 2024-02-28 DIAGNOSIS — N184 Chronic kidney disease, stage 4 (severe): Secondary | ICD-10-CM

## 2024-02-28 DIAGNOSIS — M5135 Other intervertebral disc degeneration, thoracolumbar region: Secondary | ICD-10-CM | POA: Diagnosis not present

## 2024-02-28 DIAGNOSIS — M48061 Spinal stenosis, lumbar region without neurogenic claudication: Secondary | ICD-10-CM | POA: Diagnosis not present

## 2024-02-28 DIAGNOSIS — E876 Hypokalemia: Secondary | ICD-10-CM

## 2024-02-28 LAB — MAGNESIUM: Magnesium: 2.2 mg/dL (ref 1.7–2.4)

## 2024-02-28 LAB — BASIC METABOLIC PANEL WITH GFR
Anion gap: 9 (ref 5–15)
BUN: 44 mg/dL — ABNORMAL HIGH (ref 6–20)
CO2: 30 mmol/L (ref 22–32)
Calcium: 9.6 mg/dL (ref 8.9–10.3)
Chloride: 99 mmol/L (ref 98–111)
Creatinine, Ser: 2.7 mg/dL — ABNORMAL HIGH (ref 0.61–1.24)
GFR, Estimated: 26 mL/min — ABNORMAL LOW (ref >60)
Glucose, Bld: 181 mg/dL — ABNORMAL HIGH (ref 70–99)
Potassium: 3.7 mmol/L (ref 3.5–5.1)
Sodium: 138 mmol/L (ref 135–145)

## 2024-02-28 LAB — CBC
HCT: 36 % — ABNORMAL LOW (ref 39.0–52.0)
Hemoglobin: 12 g/dL — ABNORMAL LOW (ref 13.0–17.0)
MCH: 29.9 pg (ref 26.0–34.0)
MCHC: 33.3 g/dL (ref 30.0–36.0)
MCV: 89.6 fL (ref 80.0–100.0)
Platelets: 277 K/uL (ref 150–400)
RBC: 4.02 MIL/uL — ABNORMAL LOW (ref 4.22–5.81)
RDW: 13.9 % (ref 11.5–15.5)
WBC: 6.7 K/uL (ref 4.0–10.5)
nRBC: 0 % (ref 0.0–0.2)

## 2024-02-28 LAB — GLUCOSE, CAPILLARY
Glucose-Capillary: 154 mg/dL — ABNORMAL HIGH (ref 70–99)
Glucose-Capillary: 203 mg/dL — ABNORMAL HIGH (ref 70–99)
Glucose-Capillary: 243 mg/dL — ABNORMAL HIGH (ref 70–99)
Glucose-Capillary: 231 mg/dL — ABNORMAL HIGH (ref 70–99)

## 2024-02-28 MED ORDER — POTASSIUM CHLORIDE CRYS ER 20 MEQ PO TBCR: 40.0000 meq | EXTENDED_RELEASE_TABLET | Freq: Two times a day (BID) | ORAL | 11 refills | Status: DC

## 2024-02-28 MED ORDER — MAGNESIUM OXIDE 400 MG PO TABS: 400.0000 mg | ORAL_TABLET | Freq: Every day | ORAL | 3 refills | Status: DC

## 2024-02-28 MED ORDER — COLCHICINE 0.6 MG PO TABS: 0.3000 mg | ORAL_TABLET | ORAL | Status: AC

## 2024-02-28 MED ORDER — AMIODARONE HCL 200 MG PO TABS: ORAL_TABLET | ORAL | 11 refills | Status: DC

## 2024-02-28 MED ORDER — FLUTICASONE FUROATE-VILANTEROL 100-25 MCG/ACT IN AEPB
1.0000 | INHALATION_SPRAY | Freq: Every day | RESPIRATORY_TRACT | Status: DC
  Administered 2024-02-28 – 2024-02-29 (×2): 1 via RESPIRATORY_TRACT
  Filled 2024-02-28: qty 28

## 2024-02-28 MED ORDER — CYCLOBENZAPRINE HCL 10 MG PO TABS
5.0000 mg | ORAL_TABLET | Freq: Every day | ORAL | Status: AC
Start: 2024-02-28 — End: 2024-02-28
  Administered 2024-02-28: 5 mg via ORAL
  Filled 2024-02-28: qty 1

## 2024-02-28 MED ORDER — NOVOLOG FLEXPEN 100 UNIT/ML ~~LOC~~ SOPN: 22.0000 [IU] | PEN_INJECTOR | Freq: Two times a day (BID) | SUBCUTANEOUS | Status: DC

## 2024-02-28 MED ORDER — HYDROCODONE-ACETAMINOPHEN 5-325 MG PO TABS
1.0000 | ORAL_TABLET | ORAL | Status: DC | PRN
  Administered 2024-02-28 – 2024-03-01 (×6): 1 via ORAL
  Filled 2024-02-28 (×6): qty 1

## 2024-02-28 MED ORDER — LANTUS SOLOSTAR 100 UNIT/ML ~~LOC~~ SOPN: 30.0000 [IU] | PEN_INJECTOR | Freq: Every day | SUBCUTANEOUS | Status: DC

## 2024-02-28 MED ORDER — CLOTRIMAZOLE 1 % EX OINT
1.0000 | TOPICAL_OINTMENT | Freq: Two times a day (BID) | CUTANEOUS | Status: AC | PRN
Start: 2024-02-28 — End: ?

## 2024-02-28 NOTE — Consult Note (Signed)
 Consult Note   Patient: [name] FMW:[id number] DOB: 1966/03/08 DOA: 02/25/2024 DOS: the patient was seen and examined on 02/28/2024 PCP: Vicci Barnie NOVAK, MD  Patient coming from: Home  Requesting physician: Glendia Ferrier  Reason for consultation: Low back pain and medical management   HPI:  [name] is a 58 y.o. male with past medical history  of  HFrEF (dx 2015) w/ LHC demonstrating normal coronary anatomy s/p primary prevention ICD, OSA unable to tolerate CPAP, CKDIIIB & T2DM, admitted on cardiology service on February 25, 2024 for syncope and collapse found to have V. tach in the setting of acute kidney injury hypokalemia and recent NSAID use.  Patient has been managed with amiodarone  and IV and now transition to p.o. with plans for discharge today.  Per report physical therapy valve done for patient's chronic back pain led to the consult.  At bedside patient states that this pain is chronic he does use intermittent pain medications in the past ibuprofen has provided relief but he cannot use it because of his kidney problem.  He actually has an appointment tomorrow at pain management.  Does not know if he needs to have any surgery or recommendations from neurosurgery in the past.  Currently patient does not have a spinal MD that is following him for his low back pain.  Chart review shows that patient had CT spine without contrast in December 2024 which showed disc bulging at L2-L3 with a right subarticular disc protrusion with canal stenosis, moderate multilevel degenerative disease in lower lumbar facet arthropathy.  Patient is currently in no distress alert awake oriented describes symptoms low back pain to be radiating to his buttocks and the back of his legs.  No reports of incontinence of urine or stool or numbness or paresthesia or tingling or any alarm symptoms.  No bandlike symptoms no bandlike pain or pressure.  ROS Past Medical History:  Diagnosis Date   AICD (automatic  cardioverter/defibrillator) present    Chronic bronchitis (HCC)    get it most q yr (12/23/2013)   Chronic systolic (congestive) heart failure (HCC)    Fracture of left humerus    a. 07/2013.   GERD (gastroesophageal reflux disease)    not at present time   Heart murmur    born w/it    History of renal calculi    Hypertension    Kidney stones    NICM (nonischemic cardiomyopathy) (HCC)    a. 07/2013 Echo: EF 20-25%, diff HK, Gr2 DD, mild MR, mod dil LA/RA. 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    Shockable heart rhythm detected by automated external defibrillator    Syncope    Type II diabetes mellitus (HCC) 2006   Past Surgical History:  Procedure Laterality Date   CARDIAC CATHETERIZATION  08/10/13   CHOLECYSTECTOMY  ~ 2010   IMPLANTABLE CARDIOVERTER DEFIBRILLATOR IMPLANT  12/23/2013   STJ Fortify ICD implanted by Dr Kelsie for cardiomyopathy and syncope   IMPLANTABLE CARDIOVERTER DEFIBRILLATOR IMPLANT N/A 12/23/2013   Procedure: IMPLANTABLE CARDIOVERTER DEFIBRILLATOR IMPLANT;  Surgeon: Lynwood JONETTA Kelsie, MD;  Location: [facility] CATH LAB;  Service: Cardiovascular;  Laterality: N/A;   INGUINAL HERNIA REPAIR Left 03/01/2018   Procedure: OPEN REPAIR OF LEFT INGUINAL HERNIA WITH MESH;  Surgeon: Tanda Locus, MD;  Location: [facility] ORS;  Service: General;  Laterality: Left;   LEAD REVISION/REPAIR N/A 05/15/2022   Procedure: LEAD REVISION/REPAIR;  Surgeon:  [name] Ole DASEN, MD;  Location: [facility] INVASIVE CV LAB;  Service: Cardiovascular;  Laterality: N/A;   LEFT HEART CATHETERIZATION WITH CORONARY ANGIOGRAM N/A 08/10/2013   Procedure: LEFT HEART CATHETERIZATION WITH CORONARY ANGIOGRAM;  Surgeon: Candyce GORMAN Reek, MD;  Location: [facility] CATH LAB;  Service: Cardiovascular;  Laterality: N/A;   ORIF HUMERUS FRACTURE Left 08/12/2013   Procedure: OPEN REDUCTION INTERNAL FIXATION (ORIF) HUMERAL SHAFT FRACTURE;  Surgeon: Jerona LULLA Sage, MD;   Location: [facility] OR;  Service: Orthopedics;  Laterality: Left;  Open Reduction Internal Fixation Left Humerus   RIGHT/LEFT HEART CATH AND CORONARY ANGIOGRAPHY N/A 05/12/2022   Procedure: RIGHT/LEFT HEART CATH AND CORONARY ANGIOGRAPHY;  Surgeon: Gardenia Led, DO;  Location: [facility] INVASIVE CV LAB;  Service: Cardiovascular;  Laterality: N/A;   URETEROSCOPY     laser for kidney stones    reports that he quit smoking about 9 years ago. His smoking use included cigarettes. He started smoking about 37 years ago. He has a 28 pack-year smoking history. He has never used smokeless tobacco. He reports that he does not currently use alcohol. He reports current drug use. Drug: Marijuana. Allergies  Allergen Reactions   Bee Venom Anaphylaxis   Trulicity  [Dulaglutide ] Anaphylaxis and Diarrhea    Extra dose of Trulicity  caused cardiac arrest. Patient experience onset of diarrhea with using this medication - cleared after med stopped.   Family History  Problem Relation Age of Onset   Diabetes Father    Arthritis Father    Hypertension Father    Diabetes Mother    Arthritis Mother    Hypertension Mother    Hypertension Brother    Prior to Admission medications   Medication Sig Start Date End Date Taking? Authorizing Provider  atorvastatin  (LIPITOR) 10 MG tablet Take 1 tablet (10 mg total) by mouth daily. 12/14/23  Yes Vicci Barnie NOVAK, MD  benzonatate  (TESSALON ) 200 MG capsule Take 200 mg by mouth 2 (two) times daily as needed for cough. 11/01/23  Yes [provider]  cetirizine  (EQ ALLERGY RELIEF, CETIRIZINE ,) 10 MG tablet Take 1 tablet (10 mg total) by mouth daily. 09/30/23  Yes Vicci Barnie NOVAK, MD  cholecalciferol  (VITAMIN D3) 25 MCG (1000 UNIT) tablet Take 1,000 Units by mouth in the morning.   Yes [provider]  clonazePAM  (KLONOPIN ) 0.5 MG tablet Take 1 tablet (0.5 mg total) by mouth 2 (two) times daily as needed for anxiety. Patient taking differently: Take 0.5 mg by mouth 2  (two) times daily. 02/18/24 02/17/25 Yes Carvin Arvella HERO, MD  EPINEPHrine  0.3 mg/0.3 mL IJ SOAJ injection Inject 0.3 mg into the muscle as needed. 01/30/22  Yes Iva Marty Saltness, MD  fluticasone  (FLONASE ) 50 MCG/ACT nasal spray Place 2 sprays into both nostrils daily. 11/16/23  Yes Hedges, Reyes, PA-C  gabapentin  (NEURONTIN ) 300 MG capsule Take 1 capsule (300 mg total) by mouth at bedtime. 11/25/23  Yes Vicci Barnie NOVAK, MD  ibuprofen (ADVIL) 200 MG tablet Take 600 mg by mouth every 6 (six) hours as needed for moderate pain (pain score 4-6).   Yes [provider]  loratadine  (CLARITIN ) 10 MG tablet Take 1 tablet (10 mg total) by mouth daily. 11/16/23  Yes Hedges, Reyes, PA-C  magnesium  oxide (MAG-OX) 400 MG tablet Take 1 tablet (400 mg total) by mouth daily. 02/28/24  Yes Weaver, Scott T, PA-C  methocarbamol  (ROBAXIN ) 500 MG tablet Take 1 tablet (500 mg total) by mouth daily. 11/25/23  Yes Vicci Barnie NOVAK, MD  metolazone  (ZAROXOLYN ) 5 MG tablet  Take 1 tablet (5 mg total) by mouth once a week. 11/25/23 02/25/24 Yes Vicci Barnie NOVAK, MD  metoprolol  succinate (TOPROL -XL) 25 MG 24 hr tablet Take 0.5 tablets (12.5 mg total) by mouth daily. 12/14/23  Yes Vicci Barnie NOVAK, MD  nystatin  (MYCOSTATIN /NYSTOP ) powder Apply 1 Application topically 3 (three) times daily. 11/25/23  Yes Vicci Barnie NOVAK, MD  pantoprazole  (PROTONIX ) 40 MG tablet Take 1 tablet (40 mg total) by mouth daily. 01/05/24  Yes Elpidio Reyes DEL, MD  potassium chloride  SA (KLOR-CON  M) 20 MEQ tablet Take 3 tablets (60 mEq total) by mouth daily. 01/19/24  Yes Colletta Manuelita Garre, PA-C  prednisoLONE acetate (PRED FORTE) 1 % ophthalmic suspension Place 1 drop into the left eye at bedtime. 02/09/24  Yes [provider]  rivaroxaban  (XARELTO ) 20 MG TABS tablet Take 1 tablet (20 mg total) by mouth daily with supper. 02/05/24  Yes Vicci Barnie NOVAK, MD  sertraline  (ZOLOFT ) 50 MG tablet Take 1.5 tablets (75 mg total) by mouth in  the morning. 11/19/23 11/18/24 Yes Carvin Arvella HERO, MD  SYMBICORT  160-4.5 MCG/ACT inhaler Inhale 2 puffs into the lungs in the morning and at bedtime. 08/28/23  Yes [provider]  tamsulosin  (FLOMAX ) 0.4 MG CAPS capsule Take 2 capsules (0.8 mg total) by mouth daily. 11/25/23  Yes Vicci Barnie NOVAK, MD  torsemide  (DEMADEX ) 20 MG tablet Take 2 tablets (40 mg total) by mouth daily. 11/04/23  Yes Milford, Harlene HERO, FNP  acetaminophen -codeine  (TYLENOL  #3) 300-30 MG tablet Take 1 tablet by mouth every 8 (eight) hours as needed for moderate pain (pain score 4-6) or severe pain (pain score 7-10). Patient not taking: Reported on 02/25/2024 02/18/24   Williams, Megan E, NP  amiodarone  (PACERONE ) 200 MG tablet Take 2 tablets (400 mg) twice daily for 2 weeks. Then on October 5, decrease to 1 tablet (200 mg) once daily. 02/28/24   Lelon Hamilton T, PA-C  Clotrimazole  1 % OINT Apply 1 Application topically 2 (two) times daily as needed (irritation). 02/28/24   Lelon Hamilton T, PA-C  colchicine  0.6 MG tablet Take 0.5 tablets (0.3 mg total) by mouth every Monday, Wednesday, and Friday. 02/29/24   Lelon Hamilton T, PA-C  Continuous Glucose Sensor (FREESTYLE LIBRE 2 PLUS SENSOR) MISC Change sensor every 15 days. 02/17/24   Vicci Barnie NOVAK, MD  insulin  aspart (NOVOLOG  FLEXPEN) 100 UNIT/ML FlexPen Inject 22 Units into the skin 2 (two) times daily. 02/28/24   Lelon Hamilton T, PA-C  insulin  glargine (LANTUS  SOLOSTAR) 100 UNIT/ML Solostar Pen Inject 30 Units into the skin at bedtime. 02/28/24   Lelon Hamilton T, PA-C  Insulin  Pen Needle (TECHLITE PEN NEEDLES) 32G X 4 MM MISC Use to inject insulin  into the skin 3 times per day. 10/13/23   Regalado, Belkys A, MD  latanoprost (XALATAN) 0.005 % ophthalmic solution Place 1 drop into both eyes at bedtime. Patient not taking: Reported on 02/25/2024 02/16/24   [provider]  potassium chloride  SA (KLOR-CON  M) 20 MEQ tablet Take 2 tablets (40 mEq total) by mouth 2 (two) times  daily. Take an extra 20 mEq when you take Metolazone . 02/28/24   Lelon Hamilton T, PA-C  Vitals:   02/27/24 2255 02/28/24 0525 02/28/24 0728 02/28/24 1115  BP: 117/66 107/64 114/71 103/61  Pulse: 74 65 64 70  Resp: 17 16 14 12   Temp: 98 F (36.7 C) 98 F (36.7 C) 98.3 F (36.8 C) 97.9 F (36.6 C)  TempSrc: Oral Oral Oral Oral  SpO2: 97% 98% 95% 94%  Weight:  126.5 kg    Height:       Physical Exam Vitals reviewed.  Constitutional:      General: He is not in acute distress.    Appearance: He is obese. He is not ill-appearing.  HENT:     Head: Normocephalic and atraumatic.  Eyes:     Extraocular Movements: Extraocular movements intact.  Cardiovascular:     Rate and Rhythm: Normal rate and regular rhythm.     Heart sounds: Normal heart sounds.  Pulmonary:     Effort: Pulmonary effort is normal.     Breath sounds: Normal breath sounds.  Abdominal:     General: There is no distension.     Palpations: Abdomen is soft.     Tenderness: There is no abdominal tenderness.  Neurological:     General: No focal deficit present.     Mental Status: He is alert and oriented to person, place, and time.     Cranial Nerves: Cranial nerves 2-12 are intact.     Sensory: Sensation is intact.     Motor: Motor function is intact. No weakness.     Deep Tendon Reflexes:     Reflex Scores:      Bicep reflexes are 1+ on the right side and 1+ on the left side.      Patellar reflexes are 1+ on the right side and 1+ on the left side. Psychiatric:        Attention and Perception: Attention normal.        Speech: Speech normal.        Behavior: Behavior is cooperative.      Results for orders placed or performed during the hospital encounter of 02/25/24 (from the past 48 hours)  Basic metabolic panel with GFR     Status: Abnormal   Collection Time: 02/26/24  2:24 PM  Result Value Ref Range   Sodium 135 135 - 145  mmol/L   Potassium 3.6 3.5 - 5.1 mmol/L   Chloride 94 (L) 98 - 111 mmol/L   CO2 28 22 - 32 mmol/L   Glucose, Bld 318 (H) 70 - 99 mg/dL    Comment: Glucose reference range applies only to samples taken after fasting for at least 8 hours.   BUN 64 (H) 6 - 20 mg/dL   Creatinine, Ser 6.46 (H) 0.61 - 1.24 mg/dL   Calcium  9.1 8.9 - 10.3 mg/dL   GFR, Estimated 19 (L) >60 mL/min    Comment: (NOTE) Calculated using the CKD-EPI Creatinine Equation (2021)    Anion gap 13 5 - 15    Comment: Performed at [facility], [address] [place], [place] [address]  Glucose, capillary     Status: Abnormal   Collection Time: 02/26/24  5:05 PM  Result Value Ref Range   Glucose-Capillary 283 (H) 70 - 99 mg/dL    Comment: Glucose reference range applies only to samples taken after fasting for at least 8 hours.  Glucose, capillary     Status: Abnormal   Collection Time: 02/26/24  9:20 PM  Result Value Ref Range   Glucose-Capillary 246 (H) 70 -  99 mg/dL    Comment: Glucose reference range applies only to samples taken after fasting for at least 8 hours.   Comment 1 Document in Chart   Basic metabolic panel     Status: Abnormal   Collection Time: 02/27/24  2:14 AM  Result Value Ref Range   Sodium 136 135 - 145 mmol/L   Potassium 3.6 3.5 - 5.1 mmol/L   Chloride 95 (L) 98 - 111 mmol/L   CO2 29 22 - 32 mmol/L   Glucose, Bld 265 (H) 70 - 99 mg/dL    Comment: Glucose reference range applies only to samples taken after fasting for at least 8 hours.   BUN 54 (H) 6 - 20 mg/dL   Creatinine, Ser 7.03 (H) 0.61 - 1.24 mg/dL   Calcium  9.1 8.9 - 10.3 mg/dL   GFR, Estimated 24 (L) >60 mL/min    Comment: (NOTE) Calculated using the CKD-EPI Creatinine Equation (2021)    Anion gap 12 5 - 15    Comment: Performed at [facility], [address], [place], [place] [address]  CBC     Status: Abnormal   Collection Time: 02/27/24  2:14 AM  Result Value Ref Range   WBC 6.6 4.0 - 10.5 K/uL   RBC 4.00 (L)  4.22 - 5.81 MIL/uL   Hemoglobin 12.1 (L) 13.0 - 17.0 g/dL   HCT 63.9 (L) 60.9 - 47.9 %   MCV 90.0 80.0 - 100.0 fL   MCH 30.3 26.0 - 34.0 pg   MCHC 33.6 30.0 - 36.0 g/dL   RDW 86.1 88.4 - 84.4 %   Platelets 270 150 - 400 K/uL   nRBC 0.0 0.0 - 0.2 %    Comment: Performed at [facility], [address], [place], [place] [address]  Magnesium      Status: None   Collection Time: 02/27/24  2:14 AM  Result Value Ref Range   Magnesium  2.4 1.7 - 2.4 mg/dL    Comment: Performed at [facility], [address] [place], [place] [address]  Phosphorus     Status: Abnormal   Collection Time: 02/27/24  2:14 AM  Result Value Ref Range   Phosphorus 2.4 (L) 2.5 - 4.6 mg/dL    Comment: Performed at [facility], [address] [place], [place] [address]  Glucose, capillary     Status: Abnormal   Collection Time: 02/27/24  5:10 AM  Result Value Ref Range   Glucose-Capillary 173 (H) 70 - 99 mg/dL    Comment: Glucose reference range applies only to samples taken after fasting for at least 8 hours.   Comment 1 Document in Chart   Glucose, capillary     Status: Abnormal   Collection Time: 02/27/24  6:41 AM  Result Value Ref Range   Glucose-Capillary 169 (H) 70 - 99 mg/dL    Comment: Glucose reference range applies only to samples taken after fasting for at least 8 hours.   Comment 1 Document in Chart   Glucose, capillary     Status: Abnormal   Collection Time: 02/27/24 11:32 AM  Result Value Ref Range   Glucose-Capillary 233 (H) 70 - 99 mg/dL    Comment: Glucose reference range applies only to samples taken after fasting for at least 8 hours.  Glucose, capillary     Status: Abnormal   Collection Time: 02/27/24  4:04 PM  Result Value Ref Range   Glucose-Capillary 285 (H) 70 - 99 mg/dL  Comment: Glucose reference range applies only to samples taken after fasting for at least 8 hours.  Glucose, capillary     Status: Abnormal   Collection Time: 02/27/24  9:13 PM  Result Value Ref  Range   Glucose-Capillary 300 (H) 70 - 99 mg/dL    Comment: Glucose reference range applies only to samples taken after fasting for at least 8 hours.   Comment 1 Document in Chart   Basic metabolic panel     Status: Abnormal   Collection Time: 02/28/24  1:39 AM  Result Value Ref Range   Sodium 138 135 - 145 mmol/L   Potassium 3.7 3.5 - 5.1 mmol/L   Chloride 99 98 - 111 mmol/L   CO2 30 22 - 32 mmol/L   Glucose, Bld 181 (H) 70 - 99 mg/dL    Comment: Glucose reference range applies only to samples taken after fasting for at least 8 hours.   BUN 44 (H) 6 - 20 mg/dL   Creatinine, Ser 7.29 (H) 0.61 - 1.24 mg/dL   Calcium  9.6 8.9 - 10.3 mg/dL   GFR, Estimated 26 (L) >60 mL/min    Comment: (NOTE) Calculated using the CKD-EPI Creatinine Equation (2021)    Anion gap 9 5 - 15    Comment: Performed at [facility], [address] [place], [place] [address]  CBC     Status: Abnormal   Collection Time: 02/28/24  1:39 AM  Result Value Ref Range   WBC 6.7 4.0 - 10.5 K/uL   RBC 4.02 (L) 4.22 - 5.81 MIL/uL   Hemoglobin 12.0 (L) 13.0 - 17.0 g/dL   HCT 63.9 (L) 60.9 - 47.9 %   MCV 89.6 80.0 - 100.0 fL   MCH 29.9 26.0 - 34.0 pg   MCHC 33.3 30.0 - 36.0 g/dL   RDW 86.0 88.4 - 84.4 %   Platelets 277 150 - 400 K/uL   nRBC 0.0 0.0 - 0.2 %    Comment: Performed at [facility], [address], [place], [place] [address]  Magnesium      Status: None   Collection Time: 02/28/24  1:39 AM  Result Value Ref Range   Magnesium  2.2 1.7 - 2.4 mg/dL    Comment: Performed at [facility], [address], [place], [place] [address]  Glucose, capillary     Status: Abnormal   Collection Time: 02/28/24  6:37 AM  Result Value Ref Range   Glucose-Capillary 154 (H) 70 - 99 mg/dL    Comment: Glucose reference range applies only to samples taken after fasting for at least 8 hours.   Comment 1 Document in Chart   Glucose, capillary     Status: Abnormal   Collection Time: 02/28/24 11:16 AM  Result  Value Ref Range   Glucose-Capillary 203 (H) 70 - 99 mg/dL    Comment: Glucose reference range applies only to samples taken after fasting for at least 8 hours.   *Note: Due to a large number of results and/or encounters for the requested time period, some results have not been displayed. A complete set of results can be found in Results Review.    Data Reviewed: Relevant notes from primary care and specialist visits, past discharge summaries as available in EHR, including Care Everywhere. Prior diagnostic testing as pertinent to current admission diagnoses, Updated medications and problem lists for reconciliation ED course, including vitals, labs, imaging, treatment and response to treatment,Triage notes, nursing and pharmacy notes and ED provider's notes  Notable results as noted in HPI.Discussed case with EDMD/ ED APP/ or Specialty MD on call and as needed.  Assessment & Plan  >> Chronic low back pain/history of degenerative disc disease and spinal stenosis: Will admit to our service for repeat PT evaluation and stat CT spine as patient has an AICD.  Pain control.  With dissipated discharge tomorrow morning.  As needed Norco. Low-dose Flexeril  at bedtime.  >> OSA untreated: Continuous pulse oximetry and supplemental oxygen  overnight as needed.  >> Polymorphic V. tach V-fib: Has been attributed to his episodes of hypokalemia per cardiology note Patient to continue amiodarone  400 mg twice daily for 2 weeks then 200 mg a day.  >> HFrEF: Clinically euvolemic, strict I's and O's, daily weights, low-sodium cardiac diet.  >> Diabetes mellitus type 2: Glycemic protocol. Dietitian consult.   Thank you for this consultation.  We will admit patient to on TRH service and cardiology to follow along. Time Spent on Consult: 35 minutes.   Author: Mario LULLA Blanch, MD 02/28/2024 12:27 PM

## 2024-02-28 NOTE — Discharge Instructions (Signed)
 Do not take Ibuprofen. It can hurt your kidneys further. Do not take Loratadine  (Claritin ). It can interact with Amiodarone . 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    Heart Failure Nutrition Therapy  This nutrition therapy will help you feel better and support your heart.  This plan focuses on: Limiting sodium in your diet. Salt (sodium) makes your body hold water. When your body holds too much water, you can feel shortness of breath and swelling. You can prevent these symptoms by eating less salt. Limiting fluid in your diet. For some patients, drinking too much fluid can make heart failure worse. It can cause symptoms such as shortness of breath and swelling. Limiting fluids can help relieve some of your symptoms. Managing your weight. Your registered dietitian nutritionist (RDN) can help you choose a healthy weight for your body type. You can achieve these goals by: Reading food labels to keep track of how much sodium is in the foods you eat. Limiting foods that are high in sodium. Checking your weight to make sure you're not retaining too much fluid. Reading the Food Label: How Much Sodium Is Too Much? The nutrition plan for heart failure usually limits the sodium you get from food and drinks to 2,000 milligrams per day. Salt is the main source of sodium. Read the nutrition label to find out how much sodium is in 1 serving of a food. Select foods with 140 milligrams of sodium or less per serving. Foods with more than 300 milligrams of sodium per serving may not fit into a reduced-sodium meal plan. Check serving sizes. If you eat more than 1 serving, you will get more sodium than the amount listed. Cutting Back on Sodium Avoid processed foods. Eat more fresh foods. Fresh and frozen fruits and vegetables without added juices or sauces are naturally low in sodium. Fresh meats are lower in sodium than processed meats, such as bacon, sausage,  and hot dogs. Read the nutrition label or ask your butcher to help you find a fresh meat that is low in sodium. Eat less salt, at the table and when cooking. Just 1 teaspoon of table salt has 2,300 milligrams of sodium. Leave the salt out of recipes for pasta, casseroles, and soups. Ask your RDN how to cook your favorite recipes without sodium. Be a Engineer, building services. Look for food packages that say "salt-free" or "sodium-free." These items contain less than 5 milligrams of sodium per serving. "Very-low-sodium" products contain less than 35 milligrams of sodium per serving. "Low-sodium" products contain less than 140 milligrams of sodium per serving. "Unsalted" or "no added salt" products may still be high in sodium. Check the nutrition label. Add flavors to your food without adding sodium. Try lemon juice, lime juice, fruit juice, or vinegar. Dry or fresh herbs add flavor. Try basil, bay leaf, dill, rosemary, parsley, sage, dry mustard, nutmeg, thyme, and paprika. Pepper, red pepper flakes, and cayenne pepper can add spice to your meals without adding sodium. Hot sauce contains sodium, but if you use just a drop or two, it will not add up to much. Buy a sodium-free seasoning blend or make your own at home. Use caution when you eat outside your home. Restaurant foods can be very high in sodium. Ask for nutrition information. Many restaurants provide nutrition facts on their menus or websites. Let your server know that you want your food to be cooked without salt. Ask for your salad dressing and sauces  to come "on the side." Fluid Restriction Your doctor may ask you to follow a fluid restriction in addition to taking diuretics (water pills). Ask your doctor how much fluid you can have. Foods that are liquid at room temperature are considered a fluid, such as popsicles, soup, ice cream, and Jell-O. Here are some common conversions that will help you measure your fluid intake every day: 1,000  milliliters = 1 liter or 4 cups 1 fluid ounce = 30 milliliters  1 cup = 240 milliliters 2,000 milliliters = 2 liters or 8 cups  1,500 milliliters = 1 liters or 6 cups    Weight Monitoring Weigh yourself each day. Sudden weight gain is a sign that fluid is building up in your body. Follow these guidelines: Weigh yourself every morning. If you gain 3 or more pounds in 1-2 days or 5 or more pounds within 1 week, call your doctor. Your doctor may adjust your medicine to get rid of the extra fluid. Talk with your doctor or RDN about what a healthy weight is for you. Talk with your doctor to find out what type of physical activity is best for you.  Foods Recommended Food Group Recommended Foods  Grains Bread with less than 80 milligrams sodium per slice (yeast breads usually have less sodium than those made with baking soda) Homemade bread made with reduced-sodium baking soda Many cold cereals, especially shredded wheat and puffed rice Oats, grits, or cream of wheat Dry pastas, noodles, quinoa, and rice  Vegetables Fresh and frozen vegetables without added sauces, salt, or sodium Homemade soups (salt free or low sodium) Low-sodium or sodium-free canned vegetables and soups  Fruits Fresh and canned fruits Dried fruits, such as raisins, cranberries, and prunes  Dairy (Milk and Milk Products) Milk or milk powder Rice milk and soy milk Yogurt, including Greek yogurt Small amounts of natural, block cheese or reduced-sodium cheese (Swiss, ricotta, and fresh mozzarella are lower in sodium than others) Regular or soft cream cheese and low-sodium cottage cheese  Protein Foods (Meat, Poultry, Fish, Armed forces logistics/support/administrative officer) [employer] and fish [place] bacon (except if packaged in a sodium solution) Canned or packed tuna (no more than 4 ounces at 1 serving) Dried beans and peas; edamame (fresh soybeans) Eggs or egg beaters (if  less than 200 mg per serving) Unsalted nuts or peanut butter  Desserts and Snacks Fresh  fruit or applesauce Angel food cake Granola bars Unsalted pretzels, popcorn, or nuts Pudding or gelatin with whipped cream topping Homemade rice-crispy treats Vanilla wafers Frozen fruit bars  Fats Tub or liquid margarine Unsaturated fat oils (canola, olive, corn, sunflower, safflower, peanut)  Condiments Fresh or dried herbs; low-sodium ketchup; vinegar; lemon or lime juice; pepper; salt-free seasoning mixes and marinades (salt-free seasoning blend); simple salad dressings (vinegar and oil); salt-free sauces   Foods Not Recommended Food Group Foods Not Recommended  Grains Breads or crackers topped with salt Cereals (hot/cold) with more than 300 milligrams sodium per serving Biscuits, cornbread, and other "quick" breads prepared with baking soda Prepackaged bread crumbs Self-rising flours  Vegetables Canned vegetables (unless they are salt free or low sodium) Frozen vegetables with seasoning and sauces Sauerkraut and pickled vegetables Canned or dried soups (unless they are salt free or low  sodium) [place] fries and onion rings  Fruits Dried fruits preserved with sodium-containing additives  Dairy (Milk and Milk Products) Buttermilk Processed cheeses  Cottage cheese (unless a low-sodium variety) Feta cheese; shredded cheese (has more sodium than block cheese); "singles" slices and  string cheese  Protein Foods (Meat, Poultry, Fish, Beans) Cured meats: bacon, ham, sausage, pepperoni, and hot dogs Canned meats: chili, Vienna sausage, sardines, and ham Smoked fish and meats Frozen meals that have more than 600 milligrams sodium  Fats Salted butter or margarine  Condiments Salt, sea salt, kosher salt, onion salt, and garlic salt Seasoning mixes containing salt (Lemon Pepper or Bouillon cubes) Catsup or ketchup, BBQ sauce, Worcestershire and soy sauce Salsa, pickles, olives, relish Salad dressings: ranch, blue cheese, [place], and [place]   Alcohol Check with your doctor.   Heart  Failure Sample 1-Day Menu View Nutrient Info Breakfast 1 cup regular oatmeal made with water or milk 1 cup reduced-fat (2%) milk 1 medium banana 1 slice whole wheat bread 1 tablespoon salt-free peanut butter  Morning Snack 1/2 cup dried cranberries  Lunch 3 ounces grilled chicken breast 1 cup salad greens Olive oil and vinegar dressing (for greens) 5 unsalted or low-sodium crackers Fruit plate with 1/4 cup strawberries 1/2 sliced orange (for fruit plate) 1 peach half (for fruit plate)  Afternoon Snack 1 ounce low-sodium [place] 1 piece whole wheat bread  Evening Meal 3 ounces herb-baked fish 1 baked potato 2 teaspoons soft margarine (trans fat-free) (for potato) Sliced tomatoes 1/2 cup steamed spinach drizzled with lemon juice 3-inch square of angel food cake Fresh strawberries (2) (for cake)  Evening Snack 2 tablespoons salt-free peanut butter 5 low-sodium crackers  Daily Sum Nutrient Unit Value  Macronutrients  Energy kcal 1890  Energy kJ 7906  Protein g 95  Total lipid (fat) g 56  Carbohydrate, by difference g 270  Fiber, total dietary g 31  Sugars, total g 99  Minerals  Calcium , Ca mg 949  Iron, Fe mg 27  Sodium, Na mg 1538  Vitamins  Vitamin C, total ascorbic acid mg 118  Vitamin A, IU IU 18639  Vitamin D  IU 232  Lipids  Fatty acids, total saturated g 13  Fatty acids, total monounsaturated g 22  Fatty acids, total polyunsaturated g 16  Cholesterol mg 126     Heart Failure Vegan Sample 1-Day Menu View Nutrient Info Breakfast 1 cup oatmeal  cup walnuts 1 banana 1 cup soymilk fortified with calcium , vitamin B12, and vitamin D   Lunch 1 large whole wheat pita Salad made with: 1 cup chickpeas 1 cup lettuce  cup cherry tomatoes 1 cup strawberries 1 tablespoon olive oil 1 tablespoon balsamic vinegar  Evening Meal  cup tofu 2 teaspoons olive oil Pinch garlic powder 1 baked potato 1 tablespoon margarine, soft, tub  cup cooked spinach with: Squeeze of  lemon 1 cup soymilk fortified with calcium , vitamin B12, and vitamin D   Evening Snack 1 tablespoon peanut butter, without salt  ounce pretzels, without salt  Daily Sum Nutrient Unit Value  Macronutrients  Energy kcal 1848  Energy kJ 7735  Protein g 74  Total lipid (fat) g 83  Carbohydrate, by difference g 223  Fiber, total dietary g 39  Sugars, total g 44  Minerals  Calcium , Ca mg 1325  Iron, Fe mg 20  Sodium, Na mg 1098  Vitamins  Vitamin C, total ascorbic acid mg 140  Vitamin A, IU IU 15807  Vitamin D  IU 238  Lipids  Fatty acids, total saturated g 13  Fatty acids, total monounsaturated g 33  Fatty acids, total polyunsaturated g 32  Cholesterol mg 0     Heart Failure Vegetarian (Lacto-Ovo) Sample 1-Day Menu View Nutrient Info Breakfast 1 cup oatmeal  cup walnuts 1  banana 1 cup fat-free milk  Lunch 1 large whole wheat pita Salad made with:  cup chickpeas 1 ounce mozzarella cheese 1 cup lettuce  cup cherry tomatoes 1 cup strawberries 1 tablespoon olive oil 1 tablespoon balsamic vinegar  Evening Meal  cup tofu 2 teaspoons olive oil Pinch garlic powder 1 baked potato 1 tablespoon margarine, soft, tub  cup cooked spinach with: Squeeze of lemon 1 cup fat-free milk  Evening Snack 1 tablespoon peanut butter, without salt 1 apple  Daily Sum Nutrient Unit Value  Macronutrients  Energy kcal 1847  Energy kJ 7734  Protein g 76  Total lipid (fat) g 79  Carbohydrate, by difference g 231  Fiber, total dietary g 35  Sugars, total g 83  Minerals  Calcium , Ca mg 1488  Iron, Fe mg 16  Sodium, Na mg 1100  Vitamins  Vitamin C, total ascorbic acid mg 149  Vitamin A, IU IU 16116  Vitamin D  IU 234  Lipids  Fatty acids, total saturated g 15  Fatty acids, total monounsaturated g 32  Fatty acids, total polyunsaturated g 26  Cholesterol mg 28    Copyright 2020  Academy of [employer] Dietetics. All rights reserved

## 2024-02-28 NOTE — Evaluation (Signed)
 Physical Therapy Evaluation Patient Details Name: [name] MRN: [id number] DOB: 03/07/1966 Today's Date: 02/28/2024  History of Present Illness  58 yo male presents to ED 9/18 s/p syncopal episode, run of [facility]. PMH DM2, nonischemic cardiomyopathy, CHF, ICD, CKDIIIB, prior VT storm 2023, DVT, COPD, obesity, HTN, gout, afib.  Clinical Impression  Pt presents with severe back pain, impaired balance, DOE with SPO2 90% and greater on RA, antalgic gait, and limited activity tolerance secondary to back pain. Pt to benefit from acute PT to address deficits. Pt ambulated very short room distance with RW, overall requiring light physical assist for mobility at this time. Pt very limited by back pain, which pt describes as radiating into buttocks and posterior thighs. Plan was for pt to d/c home today, PT feels pt would struggle to enter home given very limited activity tolerance and inability to climb stairs at present. PT anticipates good progress with back pain control. PT to progress mobility as tolerated, and will continue to follow acutely.   SPO2 90% and greater on RA, DOE 2/4 HR <100 throughout          If plan is discharge home, recommend the following: A lot of help with walking and/or transfers;A lot of help with bathing/dressing/bathroom   Can travel by private vehicle        Equipment Recommendations None recommended by PT  Recommendations for Other Services       Functional Status Assessment Patient has had a recent decline in their functional status and demonstrates the ability to make significant improvements in function in a reasonable and predictable amount of time.     Precautions / Restrictions Precautions Precautions: Fall Restrictions Weight Bearing Restrictions Per Provider Order: No      Mobility  Bed Mobility Overal bed mobility: Needs Assistance             General bed mobility comments: sitting EOB upon PT arrival    Transfers Overall transfer  level: Needs assistance Equipment used: Rolling walker (2 wheels) Transfers: Sit to/from Stand Sit to Stand: Min assist, From elevated surface           General transfer comment: assist for rise and steady, slow to rise, very elevated bed height to perform    Ambulation/Gait Ambulation/Gait assistance: Contact guard assist Gait Distance (Feet): 10 Feet Assistive device: Rolling walker (2 wheels) Gait Pattern/deviations: Step-through pattern, Decreased stride length, Trunk flexed Gait velocity: decr     General Gait Details: close guard for safety, cues for RW proximity. Pt limited in distance by severe pain in back  Stairs            Wheelchair Mobility     Tilt Bed    Modified Rankin (Stroke Patients Only)       Balance Overall balance assessment: Needs assistance Sitting-balance support: No upper extremity supported, Feet supported Sitting balance-Leahy Scale: Fair     Standing balance support: Bilateral upper extremity supported, During functional activity, Reliant on assistive device for balance Standing balance-Leahy Scale: Poor                               Pertinent Vitals/Pain Pain Assessment Pain Assessment: 0-10 Pain Score: 9  Pain Location: back Pain Descriptors / Indicators: Discomfort, Grimacing Pain Intervention(s): Limited activity within patient's tolerance, Monitored during session, Repositioned    Home Living Family/patient expects to be discharged to:: Private residence Living Arrangements: Other relatives (brother) Available Help  at Discharge: Family;Available PRN/intermittently Type of Home: House Home Access: Stairs to enter Entrance Stairs-Rails: Right Entrance Stairs-Number of Steps: 3   Home Layout: One level Home Equipment: Rollator (4 wheels);Cane - single point;BSC/3in1      Prior Function Prior Level of Function : Independent/Modified Independent;Driving             Mobility Comments: independent,  doesn't typically use AD ADLs Comments: Independent     Extremity/Trunk Assessment   Upper Extremity Assessment Upper Extremity Assessment: Defer to OT evaluation    Lower Extremity Assessment Lower Extremity Assessment: Generalized weakness    Cervical / Trunk Assessment Cervical / Trunk Assessment: Normal  Communication   Communication Communication: No apparent difficulties    Cognition Arousal: Alert Behavior During Therapy: WFL for tasks assessed/performed   PT - Cognitive impairments: No apparent impairments                                 Cueing       General Comments      Exercises     Assessment/Plan    PT Assessment Patient needs continued PT services  PT Problem List Decreased strength;Decreased mobility;Decreased activity tolerance;Decreased balance;Decreased knowledge of use of DME;Pain;Decreased safety awareness;Obesity;Cardiopulmonary status limiting activity       PT Treatment Interventions DME instruction;Therapeutic activities;Gait training;Patient/family education;Therapeutic exercise;Balance training;Stair training;Functional mobility training;Neuromuscular re-education    PT Goals (Current goals can be found in the Care Plan section)  Acute Rehab PT Goals Patient Stated Goal: get back pain under control PT Goal Formulation: With patient Time For Goal Achievement: 03/13/24 Potential to Achieve Goals: Good    Frequency Min 2X/week     Co-evaluation               AM-PAC PT 6 Clicks Mobility  Outcome Measure Help needed turning from your back to your side while in a flat bed without using bedrails?: A Little Help needed moving from lying on your back to sitting on the side of a flat bed without using bedrails?: A Little Help needed moving to and from a bed to a chair (including a wheelchair)?: A Little Help needed standing up from a chair using your arms (e.g., wheelchair or bedside chair)?: A Little Help needed to  walk in hospital room?: A Lot Help needed climbing 3-5 steps with a railing? : A Lot 6 Click Score: 16    End of Session Equipment Utilized During Treatment: Oxygen  (O2 d/c during and post-session, RN aware) Activity Tolerance: Patient limited by fatigue;Patient limited by pain Patient left: in bed;with call bell/phone within reach;with nursing/sitter in room;Other (comment) (per pt's RN, have not been setting bed alarm and pt states he will not get up without staff assist) Nurse Communication: Mobility status PT Visit Diagnosis: Other abnormalities of gait and mobility (R26.89);Muscle weakness (generalized) (M62.81);Pain Pain - part of body:  (back)    Time: 1140-1200 PT Time Calculation (min) (ACUTE ONLY): 20 min   Charges:   PT Evaluation $PT Eval Low Complexity: 1 Low   PT General Charges $$ ACUTE PT VISIT: 1 Visit         Johana RAMAN, PT DPT Acute Rehabilitation Services Secure Chat Preferred  Office [phone]   Heinrich Fertig FOR[place] 02/28/2024, 12:23 PM

## 2024-02-28 NOTE — Progress Notes (Signed)
  Progress Note  Patient Name: [name] Date of Encounter: 02/28/2024 [facility] Cardiologist: Redell Shallow, MD   Interval Summary   Patient feeling well without acute complaint.  Only major complaint is back pain.  Has had no arrhythmias overnight.  Vital Signs Vitals:   02/27/24 1944 02/27/24 2255 02/28/24 0525 02/28/24 0728  BP: 116/76 117/66 107/64 114/71  Pulse: 73 74 65 64  Resp: 18 17 16 14   Temp: 98.2 F (36.8 C) 98 F (36.7 C) 98 F (36.7 C) 98.3 F (36.8 C)  TempSrc: Oral Oral Oral Oral  SpO2: 96% 97% 98% 95%  Weight:   126.5 kg   Height:        Intake/Output Summary (Last 24 hours) at 02/28/2024 0836 Last data filed at 02/28/2024 0651 Gross per 24 hour  Intake 720 ml  Output 2150 ml  Net -1430 ml      02/28/2024    5:25 AM 02/27/2024    5:00 AM 02/26/2024    5:00 AM  Last 3 Weights  Weight (lbs) 278 lb 14.1 oz 278 lb 274 lb 11.2 oz  Weight (kg) 126.5 kg 126.1 kg 124.603 kg      Telemetry/ECG  Sinus rhythm- Personally Reviewed  Physical Exam  GEN: No acute distress.   Neck: No JVD Cardiac: RRR, no murmurs, rubs, or gallops.  Respiratory: Clear to auscultation bilaterally. GI: Soft, nontender, non-distended  MS: No edema  Assessment & Plan   1.  Polymorphic VT/VF: Defibrillator settings have been adjusted.  He has been switched to p.o. amiodarone .  [name] continue at 400 mg twice daily x 2 weeks followed by 200 mg daily.  As he has had no further VT, [name] plan for discharge today after he works with physical therapy.  2.  Chronic systolic heart failure: Due to nonischemic cardiomyopathy.  No signs of volume overload.  3.  Hypokalemia: Likely exacerbating his ventricular arrhythmias.  His potassium has improved.  Would continue at 40 mill equivalents daily.  He [name] need his potassium checked at the end of next week.    For questions or updates, please contact [facility] Please consult [email or web address] for contact info under          Signed, Marquee Fuchs Gladis Norton, MD

## 2024-02-28 NOTE — Progress Notes (Signed)
 MRI spine ordered for patient d/t low back pain. MRI contacted RN to see if patient was able to come down for MRI spine. RN went to inform patient of MRI and patient states that I can't do it, even with antianxiety medication. Patient stated that they tried to do the Cardiac MRI the other day and even with several doses of medication for anxiety he could not tolerate. Patient states I just can't do it.

## 2024-02-28 NOTE — Discharge Summary (Incomplete)
 Discharge Summary   Patient ID: [name] MRN: [id number]; DOB: 1965/08/24  Admit date: 02/25/2024 Discharge date: 02/28/2024  PCP:  Vicci Barnie NOVAK, MD   [facility] Providers Cardiologist:  Redell Shallow, MD  Electrophysiologist:  OLE ONEIDA HOLTS, MD  Advanced Heart Failure:  Ria Commander, DO  { Click here to update MD or APP on Care Team, Refresh:1}    Discharge Diagnoses  Principal Problem:   VT (ventricular tachycardia) (HCC) Active Problems:   Syncope   Hypokalemia   Acute renal failure superimposed on stage 3b chronic kidney disease (HCC)   Nonischemic cardiomyopathy (HCC)   ICD (implantable cardioverter-defibrillator) in place   Type 2 diabetes mellitus with hyperlipidemia (HCC)   Heart failure with mildly reduced ejection fraction (HFmrEF, 41-49%) (HCC)   Lumbar back pain   Diagnostic Studies/Procedures  ECHO LIMITED WO IMAGE ENHANCING AGENT 02/26/2024 IMPRESSIONS 1. Only off axis foreshortened apical views available unable to fully evaluate RWMA;s. Left ventricular ejection fraction, by estimation, is 45 to 50%. The left ventricle has mildly decreased function. The left ventricular internal cavity size was mildly dilated. There is severe left ventricular hypertrophy. Left ventricular diastolic parameters are consistent with Grade I diastolic dysfunction (impaired relaxation). 2. Right ventricular systolic function is normal. The right ventricular size is normal. 3. Left atrial size was mildly dilated. 4. The mitral valve is abnormal. Trivial mitral valve regurgitation. 5. The inferior vena cava is dilated in size with >50% respiratory variability, suggesting right atrial pressure of 8 mmHg.  _____________   History of Present Illness   [name] is a 58 y.o. male with a hx of HFmrEF, (EF improved to 40-45 in 12/2023) 2/2 non-ischemic cardiomyopathy (no CAD on cath in 2023) s/p primary prevention AICD, ventricular tachycardia, chronic  kidney disease, hypertension, diabetes mellitus type 2, OSA (cannot tol CPAP), LUE DVT in 04/2023, RLE DVT 12/2023, COPD. He had VT in the setting of electrolyte derangement in 04/2022 s/p recurrent ICD shocks. RA lead placed at that time and ectopy improved with atrial pacing. He is not on SGLT2i due to hx of groin cellulitis. On the day of admission, he had a syncopal episode at home. He contacted our office for device interrogation. He was directed to come to the hospital when interrogation demonstrated polymorphic VT. He had been taking Ibuprofen recently for back pain and noted some diarrhea. He had evidence of AKI with SCr 4.14 and hypokalemia with K 3 upon admission. He was started on IV Amiodarone  and admitted for further evaluation and management.    Hospital Course   Consultants: Electrophysiology    He was admitted to ICU for further management and placed on IV Amiodarone  as noted. He was seen by EP, Dr. Almetta. ATP was attempted without termination. The device was charging but he terminated on his own without ICD therapy. His VF zone parameters were decreased from 24 to 16 intervals and his VT zone from 30 intervals to 20 intervals. Amiodarone  reload was continued. He remained stable without recurrent sustained VT. His potassium was replaced. It was recommended he increase OP K supplementation to 40 mEq twice daily and add MgOx 400 mg once daily as well. It was recommended to consider getting a cardiac MRI as an OP. His IV Amiodarone  was changed oral with plans to take 400 mg twice daily x 2 weeks, then 200 mg once daily. Limited TTE demonstrated improved LVF with EF 45-50. He was seen by Dr. Inocencio this morning. He had no further  VT. The patient has significant back pain PT consult was ordered. He is felt to be stable and ready for DC to home from a cardiac standpoint. He will resume home dose of diuretics and will need a follow up BMET later this week. He already has follow up arranged with CHF  clinic.   He was evaluated by PT for significant back pain limiting his mobility. He is unable to walk 10 ft. We asked Internal Medicine to see him to help manage back pain. They will transfer to IM service and work up back pain further.      Did the patient have an acute coronary syndrome (MI, NSTEMI, STEMI, etc) this admission?:  No                               Did the patient have a percutaneous coronary intervention (stent / angioplasty)?:  No.      _____________  Discharge Vitals Blood pressure 114/71, pulse 64, temperature 98.3 F (36.8 C), temperature source Oral, resp. rate 14, height 6' (1.829 m), weight 126.5 kg, SpO2 95%.  Filed Weights   02/26/24 0500 02/27/24 0500 02/28/24 0525  Weight: 124.6 kg 126.1 kg 126.5 kg    Labs & Radiologic Studies  CBC Recent Labs    02/27/24 0214 02/28/24 0139  WBC 6.6 6.7  HGB 12.1* 12.0*  HCT 36.0* 36.0*  MCV 90.0 89.6  PLT 270 277   Basic Metabolic Panel Recent Labs    90/79/74 0214 02/28/24 0139  NA 136 138  K 3.6 3.7  CL 95* 99  CO2 29 30  GLUCOSE 265* 181*  BUN 54* 44*  CREATININE 2.96* 2.70*  CALCIUM  9.1 9.6  MG 2.4 2.2  PHOS 2.4*  --    Liver Function Tests Recent Labs    02/25/24 1144  AST 24  ALT 22  ALKPHOS 64  BILITOT 1.1  PROT 8.0  ALBUMIN  4.1   No results for input(s): LIPASE, AMYLASE in the last 72 hours. High Sensitivity Troponin:   Recent Labs  Lab 02/25/24 1144 02/25/24 1329  TROPONINIHS 24* 23*    _____________  DG Shoulder Right Result Date: 02/25/2024 CLINICAL DATA:  Right shoulder pain after fall last night. EXAM: RIGHT SHOULDER - 2+ VIEW COMPARISON:  None Available. FINDINGS: There is no evidence of fracture or dislocation. Mild degenerative disease is seen involving the right glenohumeral joint. Soft tissues are unremarkable.   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 No acute abnormality seen. Electronically Signed   By: Lynwood [name] Raddle M.D.    On: 02/25/2024 12:09    DG Chest 1 View Result Date: 02/25/2024 CLINICAL DATA:  Chest pain, syncope. EXAM: CHEST  1 VIEW COMPARISON:  December 25, 2023. FINDINGS: Stable cardiomediastinal silhouette. Left-sided defibrillator is unchanged. Hypoinflation of the lungs is noted with minimal bibasilar subsegmental atelectasis. Bony thorax is unremarkable.   IMPRESSION: Hypoinflation of the lungs with minimal bibasilar subsegmental atelectasis.  Electronically Signed   By: Lynwood [name] Raddle M.D.   On: 02/25/2024 12:06   DG Lumbar Spine Complete Result Date: 02/25/2024 CLINICAL DATA:  Lower back pain after fall last night. EXAM: LUMBAR SPINE - COMPLETE 4+ VIEW COMPARISON:  May 27, 2023. FINDINGS: No fracture or spondylolisthesis is noted. Moderate degenerative disc disease is noted at L2-3. Anterior osteophyte formation is noted at multiple other levels of the lumbar spine.   IMPRESSION: Multilevel degenerative changes as described above.  No acute abnormality seen.  Electronically Signed   By: Lynwood [name] Raddle M.D.   On: 02/25/2024 12:05    Disposition Pt is being discharged home today in good condition.  Follow-up Plans & Appointments  Follow-up Information     [facility] Follow up on 03/10/2024.   Specialty: Cardiology Why: Advanced Heart Failure Clinic 10:30 AM Entrance C, Free Valet Parking Please bring all meds to appointment Contact information: [address] [place]  [phone]               Discharge Instructions     (HEART FAILURE PATIENTS) Call MD:  Anytime you have any of the following symptoms: 1) 3 pound weight gain in 24 hours or 5 pounds in 1 week 2) shortness of breath, with or without a dry hacking cough 3) swelling in the hands, feet or stomach 4) if you have to sleep on extra pillows at night in order to breathe.   Complete by: As directed    Diet - low sodium heart healthy   Complete by:  As directed    Increase activity slowly   Complete by: As directed        Discharge Medications Allergies as of 02/28/2024       Reactions   Bee Venom Anaphylaxis   Trulicity  [dulaglutide ] Anaphylaxis, Diarrhea   Extra dose of Trulicity  caused cardiac arrest. Patient experience onset of diarrhea with using this medication - cleared after med stopped.        Medication List     STOP taking these medications    ibuprofen 200 MG tablet Commonly known as: ADVIL   loratadine  10 MG tablet Commonly known as: CLARITIN        TAKE these medications    acetaminophen -codeine  300-30 MG tablet Commonly known as: TYLENOL  #3 Take 1 tablet by mouth every 8 (eight) hours as needed for moderate pain (pain score 4-6) or severe pain (pain score 7-10).   amiodarone  200 MG tablet Commonly known as: PACERONE  Take 2 tablets (400 mg) twice daily for 2 weeks. Then on October 5, decrease to 1 tablet (200 mg) once daily. What changed:  how much to take how to take this when to take this additional instructions   atorvastatin  10 MG tablet Commonly known as: LIPITOR Take 1 tablet (10 mg total) by mouth daily.   benzonatate  200 MG capsule Commonly known as: TESSALON  Take 200 mg by mouth 2 (two) times daily as needed for cough.   cetirizine  10 MG tablet Commonly known as: EQ Allergy Relief (Cetirizine ) Take 1 tablet (10 mg total) by mouth daily.   cholecalciferol  25 MCG (1000 UNIT) tablet Commonly known as: VITAMIN D3 Take 1,000 Units by mouth in the morning.   clonazePAM  0.5 MG tablet Commonly known as: KlonoPIN  Take 1 tablet (0.5 mg total) by mouth 2 (two) times daily as needed for anxiety. What changed: when to take this   Clotrimazole  1 % Oint Apply 1 Application topically 2 (two) times daily as needed (irritation).   colchicine  0.6 MG tablet Take 0.5 tablets (0.3 mg total) by mouth every Monday, Wednesday, and Friday. Start taking on: February 29, 2024   EPINEPHrine   0.3 mg/0.3 mL Soaj injection Commonly known as: EPI-PEN Inject 0.3 mg into the muscle as needed.   fluticasone  50 MCG/ACT nasal spray Commonly known as: FLONASE  Place 2 sprays into both nostrils daily.   FreeStyle Libre 2 Plus Sensor Misc Change sensor every  15 days.   gabapentin  300 MG capsule Commonly known as: NEURONTIN  Take 1 capsule (300 mg total) by mouth at bedtime.   Lantus  SoloStar 100 UNIT/ML Solostar Pen Generic drug: insulin  glargine Inject 30 Units into the skin at bedtime.   latanoprost 0.005 % ophthalmic solution Commonly known as: XALATAN Place 1 drop into both eyes at bedtime.   magnesium  oxide 400 MG tablet Commonly known as: MAG-OX Take 1 tablet (400 mg total) by mouth daily.   methocarbamol  500 MG tablet Commonly known as: ROBAXIN  Take 1 tablet (500 mg total) by mouth daily.   metolazone  5 MG tablet Commonly known as: ZAROXOLYN  Take 1 tablet (5 mg total) by mouth once a week.   metoprolol  succinate 25 MG 24 hr tablet Commonly known as: TOPROL -XL Take 0.5 tablets (12.5 mg total) by mouth daily.   NovoLOG  FlexPen 100 UNIT/ML FlexPen Generic drug: insulin  aspart Inject 22 Units into the skin 2 (two) times daily.   nystatin  powder Commonly known as: MYCOSTATIN /NYSTOP  Apply 1 Application topically 3 (three) times daily.   pantoprazole  40 MG tablet Commonly known as: Protonix  Take 1 tablet (40 mg total) by mouth daily.   potassium chloride  SA 20 MEQ tablet Commonly known as: KLOR-CON  M Take 2 tablets (40 mEq total) by mouth 2 (two) times daily. Take an extra 20 mEq when you take Metolazone . What changed:  how much to take when to take this additional instructions   prednisoLONE acetate 1 % ophthalmic suspension Commonly known as: PRED FORTE Place 1 drop into the left eye at bedtime.   rivaroxaban  20 MG Tabs tablet Commonly known as: XARELTO  Take 1 tablet (20 mg total) by mouth daily with supper.   sertraline  50 MG tablet Commonly known  as: Zoloft  Take 1.5 tablets (75 mg total) by mouth in the morning.   Symbicort  160-4.5 MCG/ACT inhaler Generic drug: budesonide -formoterol  Inhale 2 puffs into the lungs in the morning and at bedtime.   tamsulosin  0.4 MG Caps capsule Commonly known as: FLOMAX  Take 2 capsules (0.8 mg total) by mouth daily.   TechLite Plus Pen Needles 32G X 4 MM Misc Generic drug: Insulin  Pen Needle Use to inject insulin  into the skin 3 times per day.   torsemide  20 MG tablet Commonly known as: DEMADEX  Take 2 tablets (40 mg total) by mouth daily.         Outstanding Labs/Studies BMET Thurs 9/25  Duration of Discharge Encounter: APP Time: 39 minutes   Signed, Glendia Ferrier, PA-C 02/28/2024, 11:08 AM

## 2024-02-28 NOTE — Plan of Care (Signed)
  Problem: Health Behavior/Discharge Planning: Goal: Ability to identify and utilize available resources and services will improve Outcome: Progressing Goal: Ability to manage health-related needs will improve Outcome: Progressing   Problem: Nutritional: Goal: Maintenance of adequate nutrition will improve Outcome: Progressing   Problem: Education: Goal: Knowledge of General Education information will improve Description: Including pain rating scale, medication(s)/side effects and non-pharmacologic comfort measures Outcome: Progressing

## 2024-02-28 NOTE — Progress Notes (Signed)
 Cardiology was primary on patient. Plan was for DC today but patient reporting worsening back pain so medicine team consulted and now will resume care for patient as primary to undergo PT. Will leave on the cards round list. Defer to MD in the morning if they need to be seen formally.

## 2024-02-28 NOTE — Plan of Care (Signed)
   Problem: Education: Goal: Individualized Educational Video(s) Outcome: Progressing   Problem: Coping: Goal: Ability to adjust to condition or change in health will improve Outcome: Progressing

## 2024-02-29 DIAGNOSIS — I472 Ventricular tachycardia, unspecified: Secondary | ICD-10-CM | POA: Diagnosis not present

## 2024-02-29 DIAGNOSIS — M545 Low back pain, unspecified: Secondary | ICD-10-CM

## 2024-02-29 DIAGNOSIS — I4901 Ventricular fibrillation: Secondary | ICD-10-CM | POA: Diagnosis not present

## 2024-02-29 LAB — MAGNESIUM: Magnesium: 2.1 mg/dL (ref 1.7–2.4)

## 2024-02-29 LAB — GLUCOSE, CAPILLARY
Glucose-Capillary: 139 mg/dL — ABNORMAL HIGH (ref 70–99)
Glucose-Capillary: 197 mg/dL — ABNORMAL HIGH (ref 70–99)
Glucose-Capillary: 169 mg/dL — ABNORMAL HIGH (ref 70–99)
Glucose-Capillary: 227 mg/dL — ABNORMAL HIGH (ref 70–99)
Glucose-Capillary: 233 mg/dL — ABNORMAL HIGH (ref 70–99)

## 2024-02-29 LAB — BASIC METABOLIC PANEL WITH GFR
Anion gap: 10 (ref 5–15)
BUN: 37 mg/dL — ABNORMAL HIGH (ref 6–20)
CO2: 28 mmol/L (ref 22–32)
Calcium: 9.6 mg/dL (ref 8.9–10.3)
Chloride: 100 mmol/L (ref 98–111)
Creatinine, Ser: 2.46 mg/dL — ABNORMAL HIGH (ref 0.61–1.24)
GFR, Estimated: 29 mL/min — ABNORMAL LOW (ref >60)
Glucose, Bld: 129 mg/dL — ABNORMAL HIGH (ref 70–99)
Potassium: 3.8 mmol/L (ref 3.5–5.1)
Sodium: 138 mmol/L (ref 135–145)

## 2024-02-29 LAB — CBC
HCT: 35.8 % — ABNORMAL LOW (ref 39.0–52.0)
Hemoglobin: 12.1 g/dL — ABNORMAL LOW (ref 13.0–17.0)
MCH: 30.3 pg (ref 26.0–34.0)
MCHC: 33.8 g/dL (ref 30.0–36.0)
MCV: 89.5 fL (ref 80.0–100.0)
Platelets: 266 K/uL (ref 150–400)
RBC: 4 MIL/uL — ABNORMAL LOW (ref 4.22–5.81)
RDW: 14.1 % (ref 11.5–15.5)
WBC: 7.1 K/uL (ref 4.0–10.5)
nRBC: 0 % (ref 0.0–0.2)

## 2024-02-29 MED ORDER — POTASSIUM CHLORIDE CRYS ER 20 MEQ PO TBCR
40.0000 meq | EXTENDED_RELEASE_TABLET | Freq: Two times a day (BID) | ORAL | Status: DC
  Administered 2024-02-29 – 2024-03-01 (×3): 40 meq via ORAL
  Filled 2024-02-29 (×3): qty 2

## 2024-02-29 MED ORDER — METHOCARBAMOL 500 MG PO TABS
500.0000 mg | ORAL_TABLET | Freq: Four times a day (QID) | ORAL | Status: DC
  Administered 2024-02-29 – 2024-03-01 (×3): 500 mg via ORAL
  Filled 2024-02-29 (×3): qty 1

## 2024-02-29 NOTE — Evaluation (Signed)
 Occupational Therapy Evaluation Patient Details Name: [name] MRN: [id number] DOB: 23-Oct-1965 Today's Date: 02/29/2024   History of Present Illness   58 yo male presents to ED 9/18 s/p syncopal episode, run of [facility]. PMH DM2, nonischemic cardiomyopathy, CHF, ICD, CKDIIIB, prior VT storm 2023, DVT, COPD, obesity, HTN, gout, afib.     Clinical Impressions Pt reported back pain but wanting to complete light sponge bath at sink. He was able to complete post set up for UE and assist for back but reported having long handle sponge at home. Pt then was CGA for sit to stand tranfers for peri bathing. At this time pt declining SNF stay and reports his brother can assist if needed with the return to home. At this time recommendation HH vs OP PT .      If plan is discharge home, recommend the following:   A little help with walking and/or transfers;A little help with bathing/dressing/bathroom;Assistance with cooking/housework;Assist for transportation     Functional Status Assessment   Patient has had a recent decline in their functional status and demonstrates the ability to make significant improvements in function in a reasonable and predictable amount of time.     Equipment Recommendations   Wheelchair (but may decline)      Recommendations for Other Services         Precautions/Restrictions   Precautions Precautions: Fall Recall of Precautions/Restrictions: Intact Restrictions Weight Bearing Restrictions Per Provider Order: No     Mobility Bed Mobility Overal bed mobility: Needs Assistance Bed Mobility: Supine to Sit     Supine to sit: Supervision     General bed mobility comments: cued on positioning    Transfers Overall transfer level: Needs assistance Equipment used: Rolling walker (2 wheels) Transfers: Sit to/from Stand Sit to Stand: Contact guard assist           General transfer comment: slightly elevated bed      Balance Overall  balance assessment: Needs assistance Sitting-balance support: Feet supported Sitting balance-Leahy Scale: Fair Sitting balance - Comments: changing position due to pain   Standing balance support: Bilateral upper extremity supported Standing balance-Leahy Scale: Fair                             ADL either performed or assessed with clinical judgement   ADL Overall ADL's : Needs assistance/impaired Eating/Feeding: Independent;Sitting   Grooming: Wash/dry face;Wash/dry hands;Oral care;Set up;Sitting   Upper Body Bathing: Set up;Sitting   Lower Body Bathing: Contact guard assist;Sit to/from stand   Upper Body Dressing : Set up;Sitting   Lower Body Dressing: Contact guard assist;Sit to/from stand   Toilet Transfer: Contact guard assist;Rolling walker (2 wheels) Toilet Transfer Details (indicate cue type and reason): simulated with chair Toileting- Clothing Manipulation and Hygiene: Contact guard assist;Sit to/from stand       Functional mobility during ADLs: Contact guard assist;Rolling walker (2 wheels)       Vision Baseline Vision/History: 1 Wears glasses Ability to See in Adequate Light: 0 Adequate Patient Visual Report: No change from baseline Vision Assessment?: Wears glasses for reading     Perception Perception: Within Functional Limits       Praxis Praxis: [employer]       Pertinent Vitals/Pain Pain Assessment Pain Assessment: Faces Faces Pain Scale: Hurts whole lot Pain Location: back Pain Descriptors / Indicators: Discomfort, Grimacing Pain Intervention(s): Limited activity within patient's tolerance, Monitored during session, Repositioned     Extremity/Trunk  Assessment Upper Extremity Assessment Upper Extremity Assessment: Overall WFL for tasks assessed   Lower Extremity Assessment Lower Extremity Assessment: Defer to PT evaluation   Cervical / Trunk Assessment Cervical / Trunk Assessment: Normal   Communication  Communication Communication: No apparent difficulties   Cognition Arousal: Alert Behavior During Therapy: WFL for tasks assessed/performed                                 Following commands: Intact       Cueing  General Comments   Cueing Techniques: Verbal cues      Exercises     Shoulder Instructions      Home Living Family/patient expects to be discharged to:: Private residence Living Arrangements: Other relatives (brother) Available Help at Discharge: Family;Available PRN/intermittently Type of Home: House Home Access: Stairs to enter [employer] of Steps: 3 Entrance Stairs-Rails: Right Home Layout: One level     Bathroom Shower/Tub: Chief Strategy Officer: Handicapped height Bathroom Accessibility: No   Home Equipment: Rollator (4 wheels);Cane - single point;BSC/3in1   Additional Comments: no pets      Prior Functioning/Environment Prior Level of Function : Independent/Modified Independent;Driving             Mobility Comments: independent, doesn't typically use AD ADLs Comments: Independent    OT Problem List: Decreased strength;Decreased range of motion;Decreased activity tolerance;Impaired balance (sitting and/or standing);Decreased safety awareness;Decreased knowledge of use of DME or AE;Pain   OT Treatment/Interventions: Self-care/ADL training;Therapeutic exercise;Therapeutic activities;Patient/family education;Balance training      OT Goals(Current goals can be found in the care plan section)   Acute Rehab OT Goals Patient Stated Goal: to have less back pain OT Goal Formulation: With patient Time For Goal Achievement: 03/14/24 Potential to Achieve Goals: Fair   OT Frequency:  Min 2X/week    Co-evaluation              AM-PAC OT 6 Clicks Daily Activity     Outcome Measure Help from another person eating meals?: None Help from another person taking care of personal grooming?: None Help from  another person toileting, which includes using toliet, bedpan, or urinal?: A Little Help from another person bathing (including washing, rinsing, drying)?: A Little Help from another person to put on and taking off regular upper body clothing?: None Help from another person to put on and taking off regular lower body clothing?: A Little 6 Click Score: 21   End of Session Equipment Utilized During Treatment: Gait belt;Rolling walker (2 wheels) Nurse Communication: Mobility status (IV)  Activity Tolerance: Patient tolerated treatment well Patient left: in bed;with call bell/phone within reach (woth PT)  OT Visit Diagnosis: Unsteadiness on feet (R26.81);Other abnormalities of gait and mobility (R26.89);Repeated falls (R29.6);Muscle weakness (generalized) (M62.81);Pain Pain - part of body:  (back)                Time: 8995-8944 OT Time Calculation (min): 51 min Charges:  OT General Charges $OT Visit: 1 Visit OT Evaluation $OT Eval Low Complexity: 1 Low OT Treatments $Self Care/Home Management : 23-37 mins  Warrick POUR OTR/L  Acute Rehab Services  [phone] office number   Warrick Berber 02/29/2024, 12:18 PM

## 2024-02-29 NOTE — Progress Notes (Signed)
 Patient seen and examined. No overnight events. AP-VS 60s. No recurrent VT/NSVT. Has pain clinic visit this afternoon at 1600 and hasn't cancelled yet. His back pain seems to be the main issue right now. K/Mg check in a week, currently on K 40 mEq daily. Would increase to 40 mEq bid (OP regimen was 60 mEq daily+100 mEq on Mondays before recent episodes of diarrhea). Had 180 mEq on 09/19, 80 mEq on 09/20 and 40 mEq on 09/21, today K 3.8, Mg 2.1. Have ordered for 40 mEq bid now. Continuing amio load, currently on amio 400 mg daily and has been the past few days, will continue just amio 400 mg daily dose for the 2 weeks and then 200 mg daily thereafter.   [name] DELENA Primus, MD [facility]  Cardiac Electrophysiology

## 2024-02-29 NOTE — Progress Notes (Signed)
 Physical Therapy Treatment Patient Details Name: [name] MRN: [id number] DOB: 01-17-1966 Today's Date: 02/29/2024   History of Present Illness 58 yo male presents to ED 9/18 s/p syncopal episode, run of [facility]. PMH DM2, nonischemic cardiomyopathy, CHF, ICD, CKDIIIB, prior VT storm 2023, DVT, COPD, obesity, HTN, gout, afib.    PT Comments  Pt is currently mod I for sitting to supine, supervision for sit to stand and CGA for safety for 20 ft of gait. Pt currently is limited by pain in the LB. Pt lives with his brother who is able to physically assist if needed and pt has all equipment at home to just perform transfers if needed until pain improves. Due to pt current functional status, home set up and available assistance at home recommending skilled physical therapy services 3x/week in order to address strength, balance and functional mobility to decrease risk for falls, injury and re-hospitalization.       If plan is discharge home, recommend the following: A little help with walking and/or transfers;Help with stairs or ramp for entrance;Assistance with cooking/housework;Assist for transportation     Equipment Recommendations  None recommended by PT       Precautions / Restrictions Precautions Precautions: Fall Recall of Precautions/Restrictions: Intact Restrictions Weight Bearing Restrictions Per Provider Order: No     Mobility  Bed Mobility Overal bed mobility: Modified Independent Bed Mobility: Sit to Supine       Sit to supine: Modified independent (Device/Increase time)        Transfers Overall transfer level: Needs assistance Equipment used: Rolling walker (2 wheels) Transfers: Sit to/from Stand Sit to Stand: Supervision           General transfer comment: slightly elevated bed, pt reports bed at home is higher    Ambulation/Gait Ambulation/Gait assistance: Contact guard assist Gait Distance (Feet): 20 Feet Assistive device: Rolling walker (2  wheels) Gait Pattern/deviations: Step-through pattern, Decreased stride length, Trunk flexed Gait velocity: decreased Gait velocity interpretation: <1.31 ft/sec, indicative of household ambulator   General Gait Details: close guard for safety, pt continues to be limited by back pain     Balance Overall balance assessment: Needs assistance Sitting-balance support: Feet supported Sitting balance-Leahy Scale: Fair     Standing balance support: Bilateral upper extremity supported Standing balance-Leahy Scale: Fair Standing balance comment: no overt LOB      Communication Communication Communication: No apparent difficulties  Cognition Arousal: Alert Behavior During Therapy: WFL for tasks assessed/performed   PT - Cognitive impairments: No apparent impairments     Following commands: Intact      Cueing Cueing Techniques: Verbal cues         Pertinent Vitals/Pain Pain Assessment Pain Assessment: Faces Faces Pain Scale: Hurts whole lot Pain Location: back Pain Descriptors / Indicators: Discomfort, Grimacing Pain Intervention(s): Monitored during session, Limited activity within patient's tolerance    Home Living Family/patient expects to be discharged to:: Private residence Living Arrangements: Other relatives (brother) Available Help at Discharge: Family;Available PRN/intermittently Type of Home: House Home Access: Stairs to enter Entrance Stairs-Rails: Right Entrance Stairs-Number of Steps: 3   Home Layout: One level Home Equipment: Rollator (4 wheels);Cane - single point;BSC/3in1 Additional Comments: no pets        PT Goals (current goals can now be found in the care plan section) Acute Rehab PT Goals Patient Stated Goal: get back pain under control Time For Goal Achievement: 03/13/24 Potential to Achieve Goals: Good Progress towards PT goals: Progressing toward goals  Frequency    Min 2X/week      PT Plan  Updated discharge recommendations        AM-PAC PT 6 Clicks Mobility   Outcome Measure  Help needed turning from your back to your side while in a flat bed without using bedrails?: A Little Help needed moving from lying on your back to sitting on the side of a flat bed without using bedrails?: A Little Help needed moving to and from a bed to a chair (including a wheelchair)?: A Little Help needed standing up from a chair using your arms (e.g., wheelchair or bedside chair)?: A Little Help needed to walk in hospital room?: A Little Help needed climbing 3-5 steps with a railing? : A Little 6 Click Score: 18    End of Session Equipment Utilized During Treatment: Gait belt Activity Tolerance: Patient limited by pain;Patient tolerated treatment well Patient left: in bed;with call bell/phone within reach Nurse Communication: Mobility status PT Visit Diagnosis: Other abnormalities of gait and mobility (R26.89);Muscle weakness (generalized) (M62.81);Pain Pain - part of body:  (back)     Time: 8956-8897 PT Time Calculation (min) (ACUTE ONLY): 19 min  Charges:      PT General Charges $$ ACUTE PT VISIT: 1 Visit                    Dorothyann Maier, DPT, CLT  Acute Rehabilitation Services Office: [phone] (Secure chat preferred)    Dorothyann VEAR Maier 02/29/2024, 1:42 PM

## 2024-02-29 NOTE — Progress Notes (Signed)
 PROGRESS NOTE    [name]  FMW:[id number] DOB: 12-26-1965 DOA: 02/25/2024 PCP: Vicci Barnie NOVAK, MD   Brief Narrative:    Assessment & Plan:   Principal Problem:   VT (ventricular tachycardia) (HCC) Active Problems:   Type 2 diabetes mellitus with hyperlipidemia (HCC)   Syncope   Nonischemic cardiomyopathy (HCC)   ICD (implantable cardioverter-defibrillator) in place   Lumbar back pain   Hypokalemia   Acute renal failure superimposed on stage 3b chronic kidney disease (HCC)   Heart failure with mildly reduced ejection fraction (HFmrEF, 41-49%) (HCC)  Summary:  [name] is a 58 y.o. male with past medical history  of  HFrEF (dx 2015) w/ LHC demonstrating normal coronary anatomy s/p primary prevention ICD, OSA unable to tolerate CPAP, CKDIIIB & T2DM, admitted on cardiology service on February 25, 2024 for syncope and collapse found to have V. tach in the setting of acute kidney injury hypokalemia and recent NSAID use.  Patient has been managed with amiodarone  and IV and now transition to p.o. with plans for discharge on 9/21.  He however complained of significant low back pain and hence the discharge was canceled and patient admitted under hospitalist service for further evaluation and treatment. CT of lumbar spine on 9/21 shows mild spondylosis throughout the lumbar spine with multilevel disc disease, mild multilevel canal stenosis and multilevel neural foraminal narrowing.  No fracture or subluxation.  MRI recommended but patient declined given claustrophobia.  Assessment and plan: >> Acute on Chronic low back pain/history of degenerative disc disease and spinal stenosis:  -Patient does not have signs of cauda equina on exam. - Will continue analgesics.  Will schedule Robaxin  and Tylenol .  Use Norco as needed -PT evaluation.  Short-term rehab was recommended however patient declines it. -MRI was ordered however patient declines given severe claustrophobia.  He says he  was trialed premedication with Ativan  for cardiac MRI but could not complete it. -Patient has outpatient follow-up with pain specialist on 9/22 but at this time he is not able to discharge from the hospital.  Need to reschedule.   >> OSA untreated: Continuous pulse oximetry and supplemental oxygen  overnight as needed.   >> Polymorphic V. tach V-fib/history of nonischemic cardiomyopathy/AICD Has been attributed to his episodes of hypokalemia per cardiology note Patient to continue amiodarone  400 mg twice daily for 2 weeks then 200 mg a day. Aggressive potassium supplements. Will recheck labs in the a.m.   >> HFrEF: Clinically euvolemic, strict I's and O's, daily weights, low-sodium cardiac diet.   >> Diabetes mellitus type 2: Glycemic protocol. Dietitian consult.  History of DVT: Continue Xarelto      DVT prophylaxis: Xarelto  Code Status: Full code Family Communication: Family at the bedside Disposition Plan: SNF recommended but patient declines Status is: Inpatient  Subjective:  Patient seen and examined at bedside.  He continues to complain of low back pain.  He is still severe.  He did take Norco and Robaxin  earlier in the morning today.  He says he cannot go for MRI due to claustrophobia.  He says premedications with Ativan  does not help.  Vital signs are stable. Objective: Vitals:   02/28/24 1953 02/29/24 0003 02/29/24 0457 02/29/24 0653  BP: 114/61 122/75 112/64 119/78  Pulse: 72 69 64 64  Resp: 20 15 18 17   Temp: 98 F (36.7 C) 98.1 F (36.7 C) 97.9 F (36.6 C) 97.9 F (36.6 C)  TempSrc: Oral Oral Oral Oral  SpO2: 91% 93% 95% 94%  Weight:  127.3 kg   Height:        Intake/Output Summary (Last 24 hours) at 02/29/2024 0950 Last data filed at 02/29/2024 0004 Gross per 24 hour  Intake 240 ml  Output 1100 ml  Net -860 ml   Filed Weights   02/27/24 0500 02/28/24 0525 02/29/24 0457  Weight: 126.1 kg 126.5 kg 127.3 kg    Examination:  General exam: Appears  calm and comfortable  Respiratory system: Bilateral decreased breath sounds at bases Cardiovascular system: S1 & S2 heard, Rate controlled Gastrointestinal system: Abdomen is nondistended, soft and nontender. Normal bowel sounds heard. Extremities: No cyanosis, clubbing, edema  Central nervous system: Alert and oriented. No focal neurological deficits. Moving extremities Skin: No rashes, lesions or ulcers Psychiatry: Judgement and insight appear normal. Mood & affect appropriate.     Data Reviewed: I have personally reviewed following labs and imaging studies  CBC: Recent Labs  Lab 02/25/24 1144 02/25/24 1156 02/25/24 2350 02/27/24 0214 02/28/24 0139 02/29/24 0222  WBC 8.1  --  7.1 6.6 6.7 7.1  HGB 13.9 14.3 12.9* 12.1* 12.0* 12.1*  HCT 42.4 42.0 38.4* 36.0* 36.0* 35.8*  MCV 90.8  --  89.5 90.0 89.6 89.5  PLT 308  --  272 270 277 266   Basic Metabolic Panel: Recent Labs  Lab 02/25/24 1144 02/25/24 1156 02/26/24 0733 02/26/24 1424 02/27/24 0214 02/28/24 0139 02/29/24 0222  NA 140   < > 136 135 136 138 138  K 3.0*   < > 3.2* 3.6 3.6 3.7 3.8  CL 89*   < > 92* 94* 95* 99 100  CO2 29   < > 28 28 29 30 28   GLUCOSE 238*   < > 271* 318* 265* 181* 129*  BUN 74*   < > 68* 64* 54* 44* 37*  CREATININE 4.14*   < > 3.73* 3.53* 2.96* 2.70* 2.46*  CALCIUM  9.4   < > 8.9 9.1 9.1 9.6 9.6  MG 1.7  --  2.3  --  2.4 2.2 2.1  PHOS  --   --   --   --  2.4*  --   --    < > = values in this interval not displayed.   GFR: Estimated Creatinine Clearance: 44.6 mL/min (A) (by C-G formula based on SCr of 2.46 mg/dL (H)). Liver Function Tests: Recent Labs  Lab 02/25/24 1144  AST 24  ALT 22  ALKPHOS 64  BILITOT 1.1  PROT 8.0  ALBUMIN  4.1   No results for input(s): LIPASE, AMYLASE in the last 168 hours. No results for input(s): AMMONIA in the last 168 hours. Coagulation Profile: No results for input(s): INR, PROTIME in the last 168 hours. Cardiac Enzymes: No results for  input(s): CKTOTAL, CKMB, CKMBINDEX, TROPONINI in the last 168 hours. BNP (last 3 results) No results for input(s): PROBNP in the last 8760 hours. HbA1C: No results for input(s): HGBA1C in the last 72 hours. CBG: Recent Labs  Lab 02/28/24 1116 02/28/24 1526 02/28/24 2132 02/29/24 0655 02/29/24 0759  GLUCAP 203* 243* 231* 139* 197*   Lipid Profile: No results for input(s): CHOL, HDL, LDLCALC, TRIG, CHOLHDL, LDLDIRECT in the last 72 hours. Thyroid  Function Tests: No results for input(s): TSH, T4TOTAL, FREET4, T3FREE, THYROIDAB in the last 72 hours. Anemia Panel: No results for input(s): VITAMINB12, FOLATE, FERRITIN, TIBC, IRON, RETICCTPCT in the last 72 hours. Sepsis Labs: No results for input(s): PROCALCITON, LATICACIDVEN in the last 168 hours.  Recent Results (from the past 240 hours)  MRSA Next Gen by PCR, Nasal     Status: None   Collection Time: 02/25/24  2:22 PM   Specimen: Nasal Mucosa; Nasal Swab  Result Value Ref Range Status   MRSA by PCR Next Gen NOT DETECTED NOT DETECTED Final    Comment: (NOTE) The GeneXpert MRSA Assay (FDA approved for NASAL specimens only), is one component of a comprehensive MRSA colonization surveillance program. It is not intended to diagnose MRSA infection nor to guide or monitor treatment for MRSA infections. Test performance is not FDA approved in patients less than 19 years old. Performed at [facility], [address] [place], [place] [address]          Radiology Studies: CT Lumbar Spine Wo Contrast Result Date: 02/28/2024 CLINICAL DATA:  Low back pain radiating down both legs. Recent fall. EXAM: CT LUMBAR SPINE WITHOUT CONTRAST TECHNIQUE: Multidetector CT imaging of the lumbar spine was performed without intravenous contrast administration. Multiplanar CT image reconstructions were also generated. RADIATION DOSE REDUCTION: This exam was performed according to the departmental  dose-optimization program which includes automated exposure control, adjustment of the mA and/or kV according to patient size and/or use of iterative reconstruction technique. COMPARISON:  05/27/2023 FINDINGS: Segmentation: 5 lumbar type vertebrae. Alignment: Normal. Vertebrae: Vertebral body heights are maintained. There is mild spondylosis throughout the lumbar spine. Mild facet arthropathy. No acute fracture. Paraspinal and other soft tissues: Negative. Disc levels: T12-L1 level demonstrates minimal broad-based disc bulge. L1-2 level demonstrates broad-based disc bulge. Vacuum disc phenomenon. Mild canal stenosis due to the disc disease, facet arthropathy minimal ligamentum flavum hypertrophy. Minimal bilateral neural foraminal narrowing left worse than right. L2-3 level demonstrates moderate broad-based disc bulge. Vacuum disc phenomenon. Mild-to-moderate canal stenosis which is multifactorial. Moderate bilateral neural foraminal narrowing. L3-4 level demonstrates mild broad-based disc bulge. Minimal left-sided neural foraminal narrowing. Borderline canal stenosis. L4-5 level demonstrates mild broad-based disc bulge. Minimal spinal canal stenosis. Mild bilateral neural foraminal narrowing. L5-S1 level demonstrates minimal broad-based disc bulge. Minimal calcified plaque over the distal abdominal aorta. IMPRESSION: 1. No acute fracture or subluxation of the lumbar spine. 2. 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 Mild multilevel canal stenosis and multilevel neural foraminal narrowing as described above. Consider MRI for better evaluation. Electronically Signed   By: Toribio Agreste M.D.   On: 02/28/2024 14:37        Scheduled Meds:  amiodarone   400 mg Oral Daily   atorvastatin   10 mg Oral Daily   Chlorhexidine  Gluconate Cloth  6 each Topical Daily   clonazePAM   0.5 mg Oral BID   colchicine   0.3 mg Oral Q M,W,F   fluticasone  furoate-vilanterol  1 puff Inhalation Daily    gabapentin   300 mg Oral QHS   insulin  aspart  0-20 Units Subcutaneous TID PC & HS   insulin  glargine  30 Units Subcutaneous Daily   lidocaine   1 patch Transdermal Q24H   methocarbamol   500 mg Oral QID   metoprolol  succinate  12.5 mg Oral Daily   nystatin   1 Application Topical BID   pantoprazole   40 mg Oral Daily   potassium chloride   40 mEq Oral BID   rivaroxaban   20 mg Oral Q supper   sertraline   75 mg Oral q AM   sodium chloride  flush  3 mL Intravenous Q12H   tamsulosin   0.8 mg Oral Daily   Continuous Infusions:        Quintana Canelo, MD [facility] 02/29/2024, 9:50 AM

## 2024-02-29 NOTE — Plan of Care (Signed)

## 2024-03-01 ENCOUNTER — Other Ambulatory Visit (HOSPITAL_COMMUNITY): Payer: Self-pay

## 2024-03-01 DIAGNOSIS — I472 Ventricular tachycardia, unspecified: Secondary | ICD-10-CM | POA: Diagnosis not present

## 2024-03-01 DIAGNOSIS — M545 Low back pain, unspecified: Secondary | ICD-10-CM | POA: Diagnosis not present

## 2024-03-01 LAB — MAGNESIUM: Magnesium: 2 mg/dL (ref 1.7–2.4)

## 2024-03-01 LAB — CBC
HCT: 36.6 % — ABNORMAL LOW (ref 39.0–52.0)
Hemoglobin: 12.2 g/dL — ABNORMAL LOW (ref 13.0–17.0)
MCH: 30.5 pg (ref 26.0–34.0)
MCHC: 33.3 g/dL (ref 30.0–36.0)
MCV: 91.5 fL (ref 80.0–100.0)
Platelets: 265 K/uL (ref 150–400)
RBC: 4 MIL/uL — ABNORMAL LOW (ref 4.22–5.81)
RDW: 14 % (ref 11.5–15.5)
WBC: 5.8 K/uL (ref 4.0–10.5)
nRBC: 0 % (ref 0.0–0.2)

## 2024-03-01 LAB — GLUCOSE, CAPILLARY
Glucose-Capillary: 134 mg/dL — ABNORMAL HIGH (ref 70–99)
Glucose-Capillary: 225 mg/dL — ABNORMAL HIGH (ref 70–99)
Glucose-Capillary: 201 mg/dL — ABNORMAL HIGH (ref 70–99)

## 2024-03-01 LAB — BASIC METABOLIC PANEL WITH GFR
Anion gap: 10 (ref 5–15)
BUN: 31 mg/dL — ABNORMAL HIGH (ref 6–20)
CO2: 28 mmol/L (ref 22–32)
Calcium: 9.6 mg/dL (ref 8.9–10.3)
Chloride: 103 mmol/L (ref 98–111)
Creatinine, Ser: 2.32 mg/dL — ABNORMAL HIGH (ref 0.61–1.24)
GFR, Estimated: 32 mL/min — ABNORMAL LOW (ref >60)
Glucose, Bld: 131 mg/dL — ABNORMAL HIGH (ref 70–99)
Potassium: 4.3 mmol/L (ref 3.5–5.1)
Sodium: 141 mmol/L (ref 135–145)

## 2024-03-01 MED ORDER — METHOCARBAMOL 500 MG PO TABS
500.0000 mg | ORAL_TABLET | Freq: Three times a day (TID) | ORAL | 0 refills | Status: AC
  Filled 2024-03-01: qty 20, 7d supply, fill #0

## 2024-03-01 MED ORDER — POTASSIUM CHLORIDE CRYS ER 20 MEQ PO TBCR
40.0000 meq | EXTENDED_RELEASE_TABLET | Freq: Two times a day (BID) | ORAL | 11 refills | Status: DC
  Filled 2024-03-01: qty 120, 30d supply, fill #0

## 2024-03-01 MED ORDER — HYDROCODONE-ACETAMINOPHEN 5-325 MG PO TABS: 1.0000 | ORAL_TABLET | ORAL | 0 refills | Status: DC | PRN

## 2024-03-01 MED ORDER — MAGNESIUM OXIDE 400 MG PO TABS
400.0000 mg | ORAL_TABLET | Freq: Every day | ORAL | 3 refills | Status: DC
  Filled 2024-03-01: qty 30, 30d supply, fill #0

## 2024-03-01 MED ORDER — HYDROCODONE-ACETAMINOPHEN 5-325 MG PO TABS
1.0000 | ORAL_TABLET | ORAL | Status: DC | PRN
  Administered 2024-03-01: 2 via ORAL
  Filled 2024-03-01: qty 2

## 2024-03-01 MED ORDER — METHOCARBAMOL 500 MG PO TABS: 500.0000 mg | ORAL_TABLET | Freq: Three times a day (TID) | ORAL | 0 refills | Status: DC

## 2024-03-01 MED ORDER — HYDROCODONE-ACETAMINOPHEN 5-325 MG PO TABS
1.0000 | ORAL_TABLET | ORAL | 0 refills | Status: DC | PRN
  Filled 2024-03-01: qty 20, 2d supply, fill #0

## 2024-03-01 MED ORDER — AMIODARONE HCL 200 MG PO TABS
ORAL_TABLET | ORAL | 11 refills | Status: DC
  Filled 2024-03-01: qty 40, 40d supply, fill #0

## 2024-03-01 NOTE — TOC Transition Note (Signed)
 Transition of Care [facility]) - Discharge Note   Patient Details  Name: [name] MRN: [id number] Date of Birth: 03-17-1966  [facility]) CM/SW Contact:  Justina Delcia Czar, RN Phone Number: 03/01/2024, 2:18 PM   Clinical Narrative:     PCP appt scheduled with PCP on 03/07/2024 at 330 pm. No Inpatient CM needs needed.   Final next level of care: Home/Self Care Barriers to Discharge: No Barriers Identified   Patient Goals and CMS Choice Patient states their goals for this hospitalization and ongoing recovery are:: wants to recover          Discharge Placement                       Discharge Plan and Services Additional resources added to the After Visit Summary for     Discharge Planning Services: CM Consult                                 Social Drivers of Health (SDOH) Interventions SDOH Screenings   Food Insecurity: No Food Insecurity (02/25/2024)  Housing: Low Risk  (02/25/2024)  Transportation Needs: No Transportation Needs (02/25/2024)  Utilities: Not At Risk (02/25/2024)  Alcohol Screen: Low Risk  (05/25/2023)  Depression (PHQ2-9): High Risk (02/05/2024)  Financial Resource Strain: Low Risk  (05/25/2023)  Recent Concern: Financial Resource Strain - High Risk (03/24/2023)  Physical Activity: Inactive (05/25/2023)  Social Connections: Moderately Integrated (10/08/2023)  Stress: Stress Concern Present (05/25/2023)  Tobacco Use: Medium Risk (02/25/2024)  Health Literacy: Adequate Health Literacy (05/25/2023)     Readmission Risk Interventions    12/21/2023    4:08 PM  Readmission Risk Prevention Plan  Transportation Screening Complete  Medication Review (RN Care Manager) Complete  PCP or Specialist appointment within 3-5 days of discharge Complete  HRI or Home Care Consult Complete  Palliative Care Screening Not Applicable  Skilled Nursing Facility Not Applicable

## 2024-03-01 NOTE — Plan of Care (Signed)

## 2024-03-01 NOTE — Discharge Summary (Addendum)
 Physician Discharge Summary  [name] FMW:[id number] DOB: January 20, 1966 DOA: 02/25/2024  PCP: Vicci Barnie NOVAK, MD  Admit date: 02/25/2024 Discharge date: 03/01/2024  Admitted From: Home Disposition: Home  Recommendations for Outpatient Follow-up:  Follow up with PCP in 1 week with repeat BMP/Mg Follow up in ED if symptoms worsen or new appear Reschedule appointment with the pain clinic. Follow-up with cardiology as scheduled   Home Health: yes  Discharge Condition: Stable CODE STATUS: Full Diet recommendation: Heart healthy/diabetic  Brief/Interim Summary:  [name] is a 58 y.o. male with past medical history  of  HFrEF (dx 2015) w/ LHC demonstrating normal coronary anatomy s/p primary prevention ICD, OSA unable to tolerate CPAP, CKDIIIB & T2DM, admitted on cardiology service on February 25, 2024 for syncope and collapse found to have V. tach in the setting of acute kidney injury hypokalemia and recent NSAID use.  Patient has been managed with amiodarone  and IV and now transitioned to p.o. load.  He was stable for discharge on 9/21 however complained of significant low back pain and hence the discharge was canceled and patient admitted under hospitalist service for further evaluation and treatment. CT of lumbar spine on 9/21 shows mild spondylosis throughout the lumbar spine with multilevel disc disease, mild multilevel canal stenosis and multilevel neural foraminal narrowing.  No fracture or subluxation.  MRI recommended but patient declined given claustrophobia. Pain now manageable with analgesics and supportive care.  He is discharged home with home health care on 9/23.   Assessment and plan: >> Acute on Chronic low back pain/history of degenerative disc disease and spinal stenosis:   -Patient does not have signs of cauda equina on exam. - CT lumbar spine findings as above.  Patient could not tolerate MRI due to claustrophobia. Symptoms improved with combination  therapies Tylenol , Robaxin  and as needed Norco as well as gabapentin .  Will continue analgesics.   -PT evaluation.  Short-term rehab was recommended however patient declines it. -Patient has outpatient follow-up with pain specialist on 9/22 will now need to be rescheduled.  >> OSA untreated: Need to follow-up outpatient  >> Polymorphic V. tach V-fib/history of nonischemic cardiomyopathy/AICD Has been attributed to his episodes of hypokalemia per cardiology note Patient to continue amiodarone  400 mg twice daily for 2 weeks then 200 mg a day starting October 5th. Aggressive potassium supplements.  On 40 mEq twice daily. Discussed need to follow-up with primary care provider within a week to monitor kidney function and electrolytes.  >> HFrEF: Clinically euvolemic, strict I's and O's, daily weights, low-sodium cardiac diet.  He can resume outpatient diuretics.   >> Diabetes mellitus type 2 with uncontrolled hyperglycemia: Hemoglobin A1c 8.5 resume home insulin  on discharge.  History of DVT: Continue Xarelto      Discharge home.  Home health care   Discharge Diagnoses:  Principal Problem:   VT (ventricular tachycardia) (HCC) Active Problems:   Type 2 diabetes mellitus with hyperlipidemia (HCC)   Syncope   Nonischemic cardiomyopathy (HCC)   ICD (implantable cardioverter-defibrillator) in place   Lumbar back pain   Hypokalemia   Acute renal failure superimposed on stage 3b chronic kidney disease (HCC)   Heart failure with mildly reduced ejection fraction (HFmrEF, 41-49%) (HCC)    Discharge Instructions  Discharge Instructions     (HEART FAILURE PATIENTS) Call MD:  Anytime you have any of the following symptoms: 1) 3 pound weight gain in 24 hours or 5 pounds in 1 week 2) shortness of breath, with or without a  dry hacking cough 3) swelling in the hands, feet or stomach 4) if you have to sleep on extra pillows at night in order to breathe.   Complete by: As directed    (HEART  FAILURE PATIENTS) Call MD:  Anytime you have any of the following symptoms: 1) 3 pound weight gain in 24 hours or 5 pounds in 1 week 2) shortness of breath, with or without a dry hacking cough 3) swelling in the hands, feet or stomach 4) if you have to sleep on extra pillows at night in order to breathe.   Complete by: As directed    Call MD for:  persistant dizziness or light-headedness   Complete by: As directed    Call MD for:  severe uncontrolled pain   Complete by: As directed    Diet - low sodium heart healthy   Complete by: As directed    Diet - low sodium heart healthy   Complete by: As directed    Diet Carb Modified   Complete by: As directed    Increase activity slowly   Complete by: As directed    Increase activity slowly   Complete by: As directed       Allergies as of 03/01/2024       Reactions   Bee Venom Anaphylaxis   Trulicity  [dulaglutide ] Anaphylaxis, Diarrhea   Extra dose of Trulicity  caused cardiac arrest. Patient experience onset of diarrhea with using this medication - cleared after med stopped.        Medication List     STOP taking these medications    acetaminophen -codeine  300-30 MG tablet Commonly known as: TYLENOL  #3   ibuprofen 200 MG tablet Commonly known as: ADVIL   loratadine  10 MG tablet Commonly known as: CLARITIN        TAKE these medications    amiodarone  200 MG tablet Commonly known as: PACERONE  Take 2 tablets (400 mg) twice daily for 2 weeks. Then on October 5, decrease to 1 tablet (200 mg) once daily. What changed:  how much to take how to take this when to take this additional instructions   atorvastatin  10 MG tablet Commonly known as: LIPITOR Take 1 tablet (10 mg total) by mouth daily.   benzonatate  200 MG capsule Commonly known as: TESSALON  Take 200 mg by mouth 2 (two) times daily as needed for cough.   cetirizine  10 MG tablet Commonly known as: EQ Allergy Relief (Cetirizine ) Take 1 tablet (10 mg total) by  mouth daily.   cholecalciferol  25 MCG (1000 UNIT) tablet Commonly known as: VITAMIN D3 Take 1,000 Units by mouth in the morning.   clonazePAM  0.5 MG tablet Commonly known as: KlonoPIN  Take 1 tablet (0.5 mg total) by mouth 2 (two) times daily as needed for anxiety. What changed: when to take this   Clotrimazole  1 % Oint Apply 1 Application topically 2 (two) times daily as needed (irritation).   colchicine  0.6 MG tablet Take 0.5 tablets (0.3 mg total) by mouth every Monday, Wednesday, and Friday.   EPINEPHrine  0.3 mg/0.3 mL Soaj injection Commonly known as: EPI-PEN Inject 0.3 mg into the muscle as needed.   fluticasone  50 MCG/ACT nasal spray Commonly known as: FLONASE  Place 2 sprays into both nostrils daily.   FreeStyle Libre 2 Plus Sensor Misc Change sensor every 15 days.   gabapentin  300 MG capsule Commonly known as: NEURONTIN  Take 1 capsule (300 mg total) by mouth at bedtime.   HYDROcodone -acetaminophen  5-325 MG tablet Commonly known as: NORCO/VICODIN Take 1-2 tablets  by mouth every 4 (four) hours as needed for moderate pain (pain score 4-6) or severe pain (pain score 7-10).   Lantus  SoloStar 100 UNIT/ML Solostar Pen Generic drug: insulin  glargine Inject 30 Units into the skin at bedtime.   latanoprost 0.005 % ophthalmic solution Commonly known as: XALATAN Place 1 drop into both eyes at bedtime.   magnesium  oxide 400 MG tablet Commonly known as: MAG-OX Take 1 tablet (400 mg total) by mouth daily.   methocarbamol  500 MG tablet Commonly known as: ROBAXIN  Take 1 tablet (500 mg total) by mouth 3 (three) times daily for 7 days. What changed: when to take this   metolazone  5 MG tablet Commonly known as: ZAROXOLYN  Take 1 tablet (5 mg total) by mouth once a week.   metoprolol  succinate 25 MG 24 hr tablet Commonly known as: TOPROL -XL Take 0.5 tablets (12.5 mg total) by mouth daily.   NovoLOG  FlexPen 100 UNIT/ML FlexPen Generic drug: insulin  aspart Inject 22  Units into the skin 2 (two) times daily.   nystatin  powder Commonly known as: MYCOSTATIN /NYSTOP  Apply 1 Application topically 3 (three) times daily.   pantoprazole  40 MG tablet Commonly known as: Protonix  Take 1 tablet (40 mg total) by mouth daily.   potassium chloride  SA 20 MEQ tablet Commonly known as: KLOR-CON  M Take 2 tablets (40 mEq total) by mouth 2 (two) times daily. Take an extra 20 mEq when you take Metolazone . What changed:  how much to take when to take this additional instructions   prednisoLONE acetate 1 % ophthalmic suspension Commonly known as: PRED FORTE Place 1 drop into the left eye at bedtime.   rivaroxaban  20 MG Tabs tablet Commonly known as: XARELTO  Take 1 tablet (20 mg total) by mouth daily with supper.   sertraline  50 MG tablet Commonly known as: Zoloft  Take 1.5 tablets (75 mg total) by mouth in the morning.   Symbicort  160-4.5 MCG/ACT inhaler Generic drug: budesonide -formoterol  Inhale 2 puffs into the lungs in the morning and at bedtime.   tamsulosin  0.4 MG Caps capsule Commonly known as: FLOMAX  Take 2 capsules (0.8 mg total) by mouth daily.   TechLite Plus Pen Needles 32G X 4 MM Misc Generic drug: Insulin  Pen Needle Use to inject insulin  into the skin 3 times per day.   torsemide  20 MG tablet Commonly known as: DEMADEX  Take 2 tablets (40 mg total) by mouth daily.        Follow-up Information      [facility] Follow up on 03/10/2024.   Specialty: Cardiology Why: [facility] 10:30 AM Entrance C, Free Valet Parking Please bring all meds to appointment Contact information: [address] [place]  [address] [phone]        Vicci Barnie NOVAK, MD. Schedule an appointment as soon as possible for a visit in 1 week(s).   Specialty: Internal Medicine Why: 03/07/24 @3 :30 PM Contact information: [address] [place]  [address] [phone]                Allergies  Allergen Reactions   Bee Venom Anaphylaxis   Trulicity  [Dulaglutide ] Anaphylaxis and Diarrhea    Extra dose of Trulicity  caused cardiac arrest. Patient experience onset of diarrhea with using this medication - cleared after med stopped.    Consultations:    Procedures/Studies: CT Lumbar Spine Wo Contrast Result Date: 02/28/2024 CLINICAL DATA:  Low back pain radiating down both legs. Recent fall. EXAM: CT LUMBAR SPINE  WITHOUT CONTRAST TECHNIQUE: Multidetector CT imaging of the lumbar spine was performed without intravenous contrast administration. Multiplanar CT image reconstructions were also generated. RADIATION DOSE REDUCTION: This exam was performed according to the departmental dose-optimization program which includes automated exposure control, adjustment of the mA and/or kV according to patient size and/or use of iterative reconstruction technique. COMPARISON:  05/27/2023 FINDINGS: Segmentation: 5 lumbar type vertebrae. Alignment: Normal. Vertebrae: Vertebral body heights are maintained. There is mild spondylosis throughout the lumbar spine. Mild facet arthropathy. No acute fracture. Paraspinal and other soft tissues: Negative. Disc levels: T12-L1 level demonstrates minimal broad-based disc bulge. L1-2 level demonstrates broad-based disc bulge. Vacuum disc phenomenon. Mild canal stenosis due to the disc disease, facet arthropathy minimal ligamentum flavum hypertrophy. Minimal bilateral neural foraminal narrowing left worse than right. L2-3 level demonstrates moderate broad-based disc bulge. Vacuum disc phenomenon. Mild-to-moderate canal stenosis which is multifactorial. Moderate bilateral neural foraminal narrowing. L3-4 level demonstrates mild broad-based disc bulge. Minimal left-sided neural foraminal narrowing. Borderline canal stenosis. L4-5 level demonstrates mild broad-based disc bulge. Minimal spinal canal stenosis. Mild bilateral  neural foraminal narrowing. L5-S1 level demonstrates minimal broad-based disc bulge. Minimal calcified plaque over the distal abdominal aorta. IMPRESSION: 1. No acute fracture or subluxation of the lumbar spine. 2. 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 Mild multilevel canal stenosis and multilevel neural foraminal narrowing as described above. Consider MRI for better evaluation. Electronically Signed   By: Toribio Agreste M.D.   On: 02/28/2024 14:37   ECHOCARDIOGRAM LIMITED Result Date: 02/26/2024    ECHOCARDIOGRAM LIMITED REPORT   Patient Name:   [name] Date of Exam: 02/26/2024 Medical Rec #:  [id number]      Height:       72.0 in Accession #:    [id number]     Weight:       274.7 lb Date of Birth:  08/06/1965      BSA:          2.438 m Patient Age:    58 years       BP:           111/61 mmHg Patient Gender: M              HR:           69 bpm. Exam Location:  Inpatient Procedure: Limited Echo, Limited Color Doppler and Cardiac Doppler (Both            Spectral and Color Flow Doppler were utilized during procedure). Indications:    Congestive Heart Failure I50.9  History:        Patient has prior history of Echocardiogram examinations, most                 recent 12/22/2023. CHF, Defibrillator, CKD; Risk                 Factors:Diabetes, Dyslipidemia and Former Smoker.  Sonographer:    Koleen Popper RDCS Referring Phys: [phone] AMY D CLEGG  Sonographer Comments: Patient is obese. Image acquisition challenging due to patient body habitus. IMPRESSIONS  1. Only off axis foreshortened apical views available unable to fully evaluate RWMA;s. Left ventricular ejection fraction, by estimation, is 45 to 50%. The left ventricle has mildly decreased function. The left ventricular internal cavity size was mildly dilated. There is severe left ventricular hypertrophy. Left ventricular diastolic parameters are consistent with Grade I diastolic dysfunction (impaired relaxation).  2. Right  ventricular systolic function is normal. The right ventricular size  is normal.  3. Left atrial size was mildly dilated.  4. The mitral valve is abnormal. Trivial mitral valve regurgitation.  5. The inferior vena cava is dilated in size with >50% respiratory variability, suggesting right atrial pressure of 8 mmHg. FINDINGS  Left Ventricle: Only off axis foreshortened apical views available unable to fully evaluate RWMA;s. Left ventricular ejection fraction, by estimation, is 45 to 50%. The left ventricle has mildly decreased function. The left ventricular internal cavity size was mildly dilated. There is severe left ventricular hypertrophy. Left ventricular diastolic parameters are consistent with Grade I diastolic dysfunction (impaired relaxation). Right Ventricle: The right ventricular size is normal. Right ventricular systolic function is normal. Left Atrium: Left atrial size was mildly dilated. Pericardium: There is no evidence of pericardial effusion. Mitral Valve: The mitral valve is abnormal. There is mild thickening of the mitral valve leaflet(s). Trivial mitral valve regurgitation. Aorta: The aortic root is normal in size and structure. Venous: The inferior vena cava is dilated in size with greater than 50% respiratory variability, suggesting right atrial pressure of 8 mmHg. Additional Comments: Spectral Doppler performed. Color Doppler performed.  LEFT VENTRICLE PLAX 2D LVIDd:         6.60 cm   Diastology LVIDs:         4.70 cm   LV e' medial:    5.77 cm/s LV PW:         1.50 cm   LV E/e' medial:  9.2 LV IVS:        1.60 cm   LV e' lateral:   5.87 cm/s LVOT diam:     2.50 cm   LV E/e' lateral: 9.0 LVOT Area:     4.91 cm  IVC IVC diam: 2.20 cm LEFT ATRIUM         Index LA diam:    4.20 cm 1.72 cm/m   AORTA Ao Root diam: 3.50 cm MITRAL VALVE MV Area (PHT): 3.42 cm    SHUNTS MV Decel Time: 222 msec    Systemic Diam: 2.50 cm MV E velocity: 52.90 cm/s MV A velocity: 74.90 cm/s MV E/A ratio:  0.71 Maude Emmer  MD Electronically signed by Maude Emmer MD Signature Date/Time: 02/26/2024/9:37:40 AM    Final    DG Shoulder Right Result Date: 02/25/2024 CLINICAL DATA:  Right shoulder pain after fall last night. EXAM: RIGHT SHOULDER - 2+ VIEW COMPARISON:  None Available. FINDINGS: There is no evidence of fracture or dislocation. Mild degenerative disease is seen involving the right glenohumeral joint. Soft tissues are unremarkable. 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 No acute abnormality seen. Electronically Signed   By: Lynwood [name] Raddle M.D.   On: 02/25/2024 12:09   DG Chest 1 View Result Date: 02/25/2024 CLINICAL DATA:  Chest pain, syncope. EXAM: CHEST  1 VIEW COMPARISON:  December 25, 2023. FINDINGS: Stable cardiomediastinal silhouette. Left-sided defibrillator is unchanged. Hypoinflation of the lungs is noted with minimal bibasilar subsegmental atelectasis. Bony thorax is unremarkable. IMPRESSION: Hypoinflation of the lungs with minimal bibasilar subsegmental atelectasis. Electronically Signed   By: Lynwood [name] Raddle M.D.   On: 02/25/2024 12:06   DG Lumbar Spine Complete Result Date: 02/25/2024 CLINICAL DATA:  Lower back pain after fall last night. EXAM: LUMBAR SPINE - COMPLETE 4+ VIEW COMPARISON:  May 27, 2023. FINDINGS: No fracture or spondylolisthesis is noted. Moderate degenerative disc disease is noted at L2-3. Anterior osteophyte formation is noted at multiple other levels of the lumbar spine. IMPRESSION: Multilevel degenerative changes  as described above. No acute abnormality seen. Electronically Signed   By: Lynwood [name] Raddle M.D.   On: 02/25/2024 12:05   CUP PACEART REMOTE DEVICE CHECK Result Date: 02/18/2024 ICD scheduled remote reviewed. Normal device function.  Presenting rhythm: AS-VS Abnormal HF during this monitoring period. Next remote 91 days. AB, CVRS     Subjective:   Discharge Exam: Vitals:   03/01/24 1027 03/01/24 1119  BP: 111/68 115/64  Pulse: 65  66  Resp:  12  Temp:  97.9 F (36.6 C)  SpO2:      General: Pt is alert, awake, not in acute distress Cardiovascular: rate controlled, S1/S2 + Respiratory: bilateral decreased breath sounds at bases Abdominal: Soft, NT, ND, bowel sounds + Extremities: no edema, no cyanosis    The results of significant diagnostics from this hospitalization (including imaging, microbiology, ancillary and laboratory) are listed below for reference.     Microbiology: Recent Results (from the past 240 hours)  MRSA Next Gen by PCR, Nasal     Status: None   Collection Time: 02/25/24  2:22 PM   Specimen: Nasal Mucosa; Nasal Swab  Result Value Ref Range Status   MRSA by PCR Next Gen NOT DETECTED NOT DETECTED Final    Comment: (NOTE) The GeneXpert MRSA Assay (FDA approved for NASAL specimens only), is one component of a comprehensive MRSA colonization surveillance program. It is not intended to diagnose MRSA infection nor to guide or monitor treatment for MRSA infections. Test performance is not FDA approved in patients less than 62 years old. Performed at [facility], [address] [place], [place] [address]      Labs: BNP (last 3 results) Recent Labs    10/23/23 1105 12/19/23 1756 01/18/24 1558  BNP 25.5 27.0 23.3   Basic Metabolic Panel: Recent Labs  Lab 02/26/24 0733 02/26/24 1424 02/27/24 0214 02/28/24 0139 02/29/24 0222 03/01/24 0315  NA 136 135 136 138 138 141  K 3.2* 3.6 3.6 3.7 3.8 4.3  CL 92* 94* 95* 99 100 103  CO2 28 28 29 30 28 28   GLUCOSE 271* 318* 265* 181* 129* 131*  BUN 68* 64* 54* 44* 37* 31*  CREATININE 3.73* 3.53* 2.96* 2.70* 2.46* 2.32*  CALCIUM  8.9 9.1 9.1 9.6 9.6 9.6  MG 2.3  --  2.4 2.2 2.1 2.0  PHOS  --   --  2.4*  --   --   --    Liver Function Tests: Recent Labs  Lab 02/25/24 1144  AST 24  ALT 22  ALKPHOS 64  BILITOT 1.1  PROT 8.0  ALBUMIN  4.1   No results for input(s): LIPASE, AMYLASE in the last 168 hours. No results for  input(s): AMMONIA in the last 168 hours. CBC: Recent Labs  Lab 02/25/24 2350 02/27/24 0214 02/28/24 0139 02/29/24 0222 03/01/24 0315  WBC 7.1 6.6 6.7 7.1 5.8  HGB 12.9* 12.1* 12.0* 12.1* 12.2*  HCT 38.4* 36.0* 36.0* 35.8* 36.6*  MCV 89.5 90.0 89.6 89.5 91.5  PLT 272 270 277 266 265   Cardiac Enzymes: No results for input(s): CKTOTAL, CKMB, CKMBINDEX, TROPONINI in the last 168 hours. BNP: Invalid input(s): POCBNP CBG: Recent Labs  Lab 02/29/24 1147 02/29/24 1623 02/29/24 2135 03/01/24 0618 03/01/24 1014  GLUCAP 169* 227* 233* 134* 225*   D-Dimer No results for input(s): DDIMER in the last 72 hours. Hgb A1c No results for input(s): HGBA1C in the last 72 hours. Lipid Profile No results for input(s): CHOL, HDL, LDLCALC, TRIG, CHOLHDL,  LDLDIRECT in the last 72 hours. Thyroid  function studies No results for input(s): TSH, T4TOTAL, T3FREE, THYROIDAB in the last 72 hours.  Invalid input(s): FREET3 Anemia work up No results for input(s): VITAMINB12, FOLATE, FERRITIN, TIBC, IRON, RETICCTPCT in the last 72 hours. Urinalysis    Component Value Date/Time   COLORURINE STRAW (A) 10/08/2023 0952   APPEARANCEUR CLEAR 10/08/2023 0952   APPEARANCEUR Clear 07/17/2020 1655   LABSPEC 1.009 10/08/2023 0952   PHURINE 5.0 10/08/2023 0952   GLUCOSEU 50 (A) 10/08/2023 0952   HGBUR NEGATIVE 10/08/2023 0952   BILIRUBINUR NEGATIVE 10/08/2023 0952   BILIRUBINUR negative 01/30/2022 1705   BILIRUBINUR Negative 07/17/2020 1655   KETONESUR NEGATIVE 10/08/2023 0952   PROTEINUR NEGATIVE 10/08/2023 0952   UROBILINOGEN 0.2 01/30/2022 1705   UROBILINOGEN 1.0 06/20/2013 1032   NITRITE NEGATIVE 10/08/2023 0952   LEUKOCYTESUR NEGATIVE 10/08/2023 0952   Sepsis Labs Recent Labs  Lab 02/27/24 0214 02/28/24 0139 02/29/24 0222 03/01/24 0315  WBC 6.6 6.7 7.1 5.8   Microbiology Recent Results (from the past 240 hours)  MRSA Next Gen by PCR,  Nasal     Status: None   Collection Time: 02/25/24  2:22 PM   Specimen: Nasal Mucosa; Nasal Swab  Result Value Ref Range Status   MRSA by PCR Next Gen NOT DETECTED NOT DETECTED Final    Comment: (NOTE) The GeneXpert MRSA Assay (FDA approved for NASAL specimens only), is one component of a comprehensive MRSA colonization surveillance program. It is not intended to diagnose MRSA infection nor to guide or monitor treatment for MRSA infections. Test performance is not FDA approved in patients less than 8 years old. Performed at [name], [address], [place], [place] [address]      Time coordinating discharge: 35 minutes  SIGNED:   Derryl Duval, MD  [facility] 03/01/2024, 12:18 PM

## 2024-03-01 NOTE — Progress Notes (Signed)
 DISCHARGE NOTE HOME [name] to be discharged Home per MD order. Discussed prescriptions and follow up appointments with the patient. Prescriptions given to patient; medication list explained in detail. Patient verbalized understanding.  Skin clean, dry and intact without evidence of skin break down, no evidence of skin tears noted. IV catheter discontinued intact. Site without signs and symptoms of complications. Dressing and pressure applied. Pt denies pain at the site currently. No complaints noted.  Patient free of lines, drains, and wounds.   An After Visit Summary (AVS) was printed and given to the patient. Patient escorted via wheelchair, and discharged home via private auto.  [name], RN

## 2024-03-02 ENCOUNTER — Telehealth: Payer: Self-pay | Admitting: *Deleted

## 2024-03-02 DIAGNOSIS — G4733 Obstructive sleep apnea (adult) (pediatric): Secondary | ICD-10-CM | POA: Diagnosis not present

## 2024-03-02 DIAGNOSIS — Z599 Problem related to housing and economic circumstances, unspecified: Secondary | ICD-10-CM

## 2024-03-02 NOTE — Transitions of Care (Post Inpatient/ED Visit) (Signed)
 03/02/2024  Name: GEVORK AYYAD MRN: [id number] DOB: 27-Nov-1965  Today's TOC FU Call Status: Today's TOC FU Call Status:: Successful TOC FU Call Completed TOC FU Call Complete Date: 03/02/24 Patient's Name and Date of Birth confirmed.  Transition Care Management Follow-up Telephone Call Date of Discharge: 03/01/24 Discharge Facility: [facility]) Type of Discharge: Inpatient Admission Primary Inpatient Discharge Diagnosis:: VT (ventricular tachycardia) How have you been since you were released from the hospital?: Same Any questions or concerns?: No  Items Reviewed: Did you receive and understand the discharge instructions provided?: Yes Medications obtained,verified, and reconciled?: Partial Review Completed Reason for Partial Mediation Review: Patient declined full medication review due to back pain. Any new allergies since your discharge?: No Dietary orders reviewed?: Yes Type of Diet Ordered:: low sodium heart healthy Carb Modified Do you have support at home?: Yes People in Home [RPT]: sibling(s) Name of Support/Comfort Primary Source: Brother/Manuel  Medications Reviewed Today: Medications Reviewed Today     Reviewed by Lucky Andrea LABOR, RN (Registered Nurse) on 03/02/24 at 1034  Med List Status: <None>   Medication Order Taking? Sig Documenting Provider Last Dose Status Informant  amiodarone  (PACERONE ) 200 MG tablet [id number] Yes Take 2 tablets (400 mg) twice daily for 2 weeks. Then on October 5, decrease to 1 tablet (200 mg) once daily. Sigdel, Santosh, MD  Active   atorvastatin  (LIPITOR) 10 MG tablet [id number] Yes Take 1 tablet (10 mg total) by mouth daily. Vicci Barnie NOVAK, MD  Active Self, Pharmacy Records  benzonatate  (TESSALON ) 200 MG capsule [id number] Yes Take 200 mg by mouth 2 (two) times daily as needed for cough. [provider]  Active Self, Pharmacy Records  cetirizine  [facility] ALLERGY RELIEF, CETIRIZINE ,) 10 MG tablet [id number]  Take 1 tablet (10 mg  total) by mouth daily. Vicci Barnie NOVAK, MD  Active Self, Pharmacy Records  cholecalciferol  (VITAMIN D3) 25 MCG (1000 UNIT) tablet [id number]  Take 1,000 Units by mouth in the morning. [provider]  Active Self, Pharmacy Records  clonazePAM  (KLONOPIN ) 0.5 MG tablet [id number]  Take 1 tablet (0.5 mg total) by mouth 2 (two) times daily as needed for anxiety.  Patient taking differently: Take 0.5 mg by mouth 2 (two) times daily.   Carvin Arvella HERO, MD  Active Self, Pharmacy Records  Clotrimazole  1 % OINT [id number]  Apply 1 Application topically 2 (two) times daily as needed (irritation). Lelon Glendia DASEN, [place]  Active   colchicine  0.6 MG tablet [id number]  Take 0.5 tablets (0.3 mg total) by mouth every Monday, Wednesday, and Friday. Lelon Glendia T, PA-C  Active   Continuous Glucose Sensor (FREESTYLE LIBRE 2 PLUS SENSOR) [place] [id number]  Change sensor every 15 days. Vicci Barnie NOVAK, MD  Active Self, Pharmacy Records  EPINEPHrine  0.3 mg/0.3 mL IJ SOAJ injection [id number]  Inject 0.3 mg into the muscle as needed. Iva Marty Saltness, MD  Active Self, Pharmacy Records           Med Note Newton, NEVADA   Dju Dec 19, 2023  8:47 PM)    fluticasone  (FLONASE ) 50 MCG/ACT nasal spray [id number]  Place 2 sprays into both nostrils daily. Palmer Purchase, PA-C  Active Self, Pharmacy Records  gabapentin  (NEURONTIN ) 300 MG capsule [id number]  Take 1 capsule (300 mg total) by mouth at bedtime. Vicci Barnie NOVAK, MD  Active Self, Pharmacy Records  HYDROcodone -acetaminophen  (NORCO/VICODIN) 5-325 MG tablet [id number] Yes Take 1-2 tablets by mouth every 4 (four) hours as needed for moderate pain (pain score 4-6)  or severe pain (pain score 7-10). Sigdel, Santosh, MD  Active   insulin  aspart (NOVOLOG  FLEXPEN) 100 UNIT/ML FlexPen [id number] Yes Inject 22 Units into the skin 2 (two) times daily. Lelon Hamilton T, PA-C  Active   insulin  glargine (LANTUS  SOLOSTAR) 100 UNIT/ML Solostar Pen [id number] Yes Inject 30 Units  into the skin at bedtime. Lelon Hamilton T, PA-C  Active   Insulin  Pen Needle (TECHLITE PEN NEEDLES) 32G X 4 MM MISC [id number]  Use to inject insulin  into the skin 3 times per day. Regalado, Owen LABOR, MD  Active Self, Pharmacy Records  latanoprost (XALATAN) 0.005 % ophthalmic solution [id number]  Place 1 drop into both eyes at bedtime.  Patient not taking: Reported on 02/25/2024   [provider]  Active Self, Pharmacy Records  magnesium  oxide (MAG-OX) 400 MG tablet [id number]  Take 1 tablet (400 mg total) by mouth daily. Sigdel, Santosh, MD  Active   methocarbamol  (ROBAXIN ) 500 MG tablet [id number] Yes Take 1 tablet (500 mg total) by mouth 3 (three) times daily for 7 days. Sigdel, Santosh, MD  Active   metolazone  (ZAROXOLYN ) 5 MG tablet [id number]  Take 1 tablet (5 mg total) by mouth once a week. Vicci Barnie NOVAK, MD  Expired 02/25/24 2359 Self, Pharmacy Records           Med Note JACKOLYN WADDELL VEAR [place] Feb 25, 2024 12:20 PM) On Mondays  metoprolol  succinate (TOPROL -XL) 25 MG 24 hr tablet [id number]  Take 0.5 tablets (12.5 mg total) by mouth daily. Vicci Barnie NOVAK, MD  Active Self, Pharmacy Records  nystatin  (MYCOSTATIN /NYSTOP ) powder [id number]  Apply 1 Application topically 3 (three) times daily. Vicci Barnie NOVAK, MD  Active Self, Pharmacy Records  pantoprazole  (PROTONIX ) 40 MG tablet [id number]  Take 1 tablet (40 mg total) by mouth daily. Elpidio Reyes VEAR, MD  Active Self, Pharmacy Records  potassium chloride  SA (KLOR-CON  M) 20 MEQ tablet [id number]  Take 2 tablets (40 mEq total) by mouth 2 (two) times daily. Take an extra 20 mEq when you take Metolazone . Sigdel, Santosh, MD  Active   prednisoLONE acetate (PRED FORTE) 1 % ophthalmic suspension [id number]  Place 1 drop into the left eye at bedtime. [provider]  Active Self, Pharmacy Records  rivaroxaban  (XARELTO ) 20 MG TABS tablet [id number]  Take 1 tablet (20 mg total) by mouth daily with supper. Vicci Barnie NOVAK, MD  Active  Self, Pharmacy Records  sertraline  (ZOLOFT ) 50 MG tablet [id number]  Take 1.5 tablets (75 mg total) by mouth in the morning. Carvin Arvella HERO, MD  Active Self, Pharmacy Records  SYMBICORT  160-4.5 MCG/ACT inhaler [id number]  Inhale 2 puffs into the lungs in the morning and at bedtime. [provider]  Active Self, Pharmacy Records  tamsulosin  (FLOMAX ) 0.4 MG CAPS capsule [id number]  Take 2 capsules (0.8 mg total) by mouth daily. Vicci Barnie NOVAK, MD  Active Self, Pharmacy Records  torsemide  (DEMADEX ) 20 MG tablet [id number]  Take 2 tablets (40 mg total) by mouth daily. [place], Harlene HERO, FNP  Active Self, Pharmacy Records            Home Care and Equipment/Supplies: Were Home Health Services Ordered?: No Any new equipment or medical supplies ordered?: No  Functional Questionnaire: Do you need assistance with bathing/showering or dressing?: Yes (utilizing shower chair) Do you need assistance with meal preparation?: No Do you need assistance with eating?: No Do you have difficulty maintaining continence: No Do you need assistance with getting out of bed/getting out  of a chair/moving?: No Do you have difficulty managing or taking your medications?: No  Follow up appointments reviewed: PCP Follow-up appointment confirmed?: Yes Date of PCP follow-up appointment?: 03/07/24 Follow-up Provider: [facility] Specialist [facility] Follow-up appointment confirmed?: Yes Date of Specialist follow-up appointment?: 03/10/24 Follow-Up Specialty Provider:: Cardiology Do you need transportation to your follow-up appointment?: No Do you understand care options if your condition(s) worsen?: Yes-patient verbalized understanding  SDOH Interventions Today    Flowsheet Row Most Recent Value  SDOH Interventions   Food Insecurity Interventions Intervention Not Indicated  Housing Interventions Intervention Not Indicated  Transportation Interventions Intervention Not Indicated  Utilities  Interventions Other (Comment)  [referral to VBCI SW]  Depression Interventions/Treatment  Medication, Currently on Treatment   RNCM advised patient to contact [facility] Member services [phone] and  inquire about benefits that may help with utilities.  Andrea Dimes RN, BSN [place]  [facility] RN Care Manager [phone]

## 2024-03-03 ENCOUNTER — Telehealth: Payer: Self-pay | Admitting: *Deleted

## 2024-03-03 NOTE — Progress Notes (Signed)
 Complex Care Management Note  Care Guide Note 03/03/2024 Name: [name] MRN: [id number] DOB: [birth year]  [name] is a 58 y.o. year old male who sees Vicci Barnie NOVAK, MD for primary care. I reached out to Cheryal JINNY Clarity by phone today to offer complex care management services.  [name] was given information about Complex Care Management services today including:   The Complex Care Management services include support from the care team which includes your Nurse Care Manager, Clinical Social Worker, or Pharmacist.  The Complex Care Management team is here to help remove barriers to the health concerns and goals most important to you. Complex Care Management services are voluntary, and the patient may decline or stop services at any time by request to their care team member.   Complex Care Management Consent Status: Patient agreed to services and verbal consent obtained.   Follow up plan:  Telephone appointment with complex care management team member scheduled for:  03/11/24  Encounter Outcome:  Patient Scheduled  Harlene Satterfield  [facility]  [facility], [facility] Guide  Direct Dial: [phone]  Fax [phone]

## 2024-03-03 NOTE — Progress Notes (Signed)
 Complex Care Management Note Care Guide Note  03/03/2024 Name: PRANISH AKHAVAN MRN: [id number] DOB: [birth year]  ZIARE CRYDER is a 58 y.o. year old male who is a primary care patient of Vicci Barnie NOVAK, MD . The community resource team was consulted for assistance with Financial Difficulties related to utilities  SDOH screenings and interventions completed:  Yes     SDOH Interventions Today    Flowsheet Row Most Recent Value  SDOH Interventions   Utilities Interventions Community Resources Provided  [has contacted all known resources , not anything else out there patient appreciated asisstance]     Care guide performed the following interventions: Patient provided with information about care guide support team and interviewed to confirm resource needs.  Follow Up Plan:  No further follow up planned at this time. The patient has been provided with needed resources.  Encounter Outcome:  Patient Visit Completed  Tyla Burgner Greenauer-Moran  [name] HealthPopulation Health Care Guide  Direct Dial:[phone] [phone] Website: [name]

## 2024-03-04 ENCOUNTER — Encounter: Payer: Self-pay | Admitting: Neurology

## 2024-03-04 ENCOUNTER — Other Ambulatory Visit (HOSPITAL_COMMUNITY)

## 2024-03-07 ENCOUNTER — Other Ambulatory Visit: Payer: Self-pay | Admitting: Internal Medicine

## 2024-03-07 ENCOUNTER — Encounter (INDEPENDENT_AMBULATORY_CARE_PROVIDER_SITE_OTHER): Payer: Self-pay | Admitting: Otolaryngology

## 2024-03-07 ENCOUNTER — Encounter: Payer: Self-pay | Admitting: Internal Medicine

## 2024-03-07 ENCOUNTER — Inpatient Hospital Stay: Admitting: Nurse Practitioner

## 2024-03-07 ENCOUNTER — Ambulatory Visit (INDEPENDENT_AMBULATORY_CARE_PROVIDER_SITE_OTHER): Admitting: Otolaryngology

## 2024-03-07 ENCOUNTER — Encounter

## 2024-03-07 VITALS — BP 109/71 | HR 84

## 2024-03-07 DIAGNOSIS — Z91198 Patient's noncompliance with other medical treatment and regimen for other reason: Secondary | ICD-10-CM | POA: Diagnosis not present

## 2024-03-07 DIAGNOSIS — G4733 Obstructive sleep apnea (adult) (pediatric): Secondary | ICD-10-CM | POA: Diagnosis not present

## 2024-03-07 DIAGNOSIS — Z95810 Presence of automatic (implantable) cardiac defibrillator: Secondary | ICD-10-CM | POA: Diagnosis not present

## 2024-03-07 DIAGNOSIS — I5022 Chronic systolic (congestive) heart failure: Secondary | ICD-10-CM

## 2024-03-07 DIAGNOSIS — Z789 Other specified health status: Secondary | ICD-10-CM

## 2024-03-07 MED ORDER — PANTOPRAZOLE SODIUM 40 MG PO TBEC: 40.0000 mg | DELAYED_RELEASE_TABLET | Freq: Every day | ORAL | 1 refills | Status: AC

## 2024-03-07 NOTE — Patient Instructions (Signed)
  It was very nice to meet you today,   Please see the following link for the [facility] program   [email or web address]  This website has information about how you can connect to other patients who have had Inspire Implant procedure

## 2024-03-07 NOTE — Progress Notes (Signed)
 ENT CONSULT:  Reason for Consult: severe OSA   HPI: Discussed the use of AI scribe software for clinical note transcription with the patient, who gave verbal consent to proceed.  History of Present Illness [name] is a 58 year old male with hx of CHF and defibrillator in place, on Xarelto , severe sleep apnea AHI of 71 on HST in March, who presents for evaluation of CPAP intolerance and consideration of alternative treatments.  He was diagnosed with severe sleep apnea in March 2025 following a home sleep test that revealed a severity index of 71. He underwent a CPAP titration study in the hospital after the initial diagnosis. Despite the recommendation to use CPAP, he is unable to tolerate it due to claustrophobia, having only used it for three days and being unable to wear it since due to inability to sleep with it in place. He has tried all types of CPAP masks but was unable to tolerate them.  He is interested in learning more about the Inspire implant procedure, although he has concerns about undergoing surgery due to his medical history. He has a significant cardiac history, including congestive heart failure with an ejection fraction that improved from 23% to 42% recently. He has a defibrillator implanted, which was activated multiple times last year due to cardiac arrest episodes. He is on Xarelto , a blood thinner, as part of his current medication regimen. He recently experienced a hospitalization due to his defibrillator firing, which was related to a drop in potassium levels after receiving an overdose of Trilogy.   Records Reviewed:  Dr Meade 06/29/23 [name] is a 58 y.o. man with history of NICM, HFrEF EF 20%, h/o VT s/p ICD placement, and COPD with former tobacco use disorder.    Interval Updates: Here for follow up with for shortness of breath, copd Having episodes of dizziness. Blood pressure is low today.    Having gasping episodes a couple of times a day where he  tries to get a deep breath in. Sometimes while laying down.   Has tried symbicort  and spiriva  and they do not help his breathing. He does think trelegy does help.  He does think trelegy inhaler is helping him once daily. Minimal albuterol  use.    1st Cousin has sarcoidosis.  No skin changes.  Has had recurrent kidney stones.  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   Unable to tolerate GLP1 in the past.    Home sleep test shows severe OSA - was recommended and ordered to have cpap titration study. This was not scheduled.    Past Medical History:  Diagnosis Date   AICD (automatic cardioverter/defibrillator) present    Chronic bronchitis (HCC)    get it most q yr (12/23/2013)   Chronic systolic (congestive) heart failure (HCC)    Fracture of left humerus    a. 07/2013.   GERD (gastroesophageal reflux disease)    not at present time   Heart murmur    born w/it    History of renal calculi    Hypertension    Kidney stones    NICM (nonischemic cardiomyopathy) (HCC)    a. 07/2013 Echo: EF 20-25%, diff HK, Gr2 DD, mild MR, mod dil LA/RA. 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    Shockable heart rhythm detected by automated external defibrillator    Syncope    Type II diabetes mellitus (HCC)  2006    Past Surgical History:  Procedure Laterality Date   CARDIAC CATHETERIZATION  08/10/13   CHOLECYSTECTOMY  ~ 2010   IMPLANTABLE CARDIOVERTER DEFIBRILLATOR IMPLANT  12/23/2013   STJ Fortify ICD implanted by Dr Kelsie for cardiomyopathy and syncope   IMPLANTABLE CARDIOVERTER DEFIBRILLATOR IMPLANT N/A 12/23/2013   Procedure: IMPLANTABLE CARDIOVERTER DEFIBRILLATOR IMPLANT;  Surgeon: Lynwood JONETTA Kelsie, MD;  [facility]: [facility] CATH LAB;  Service: Cardiovascular;  Laterality: N/A;   INGUINAL HERNIA REPAIR Left 03/01/2018   Procedure: OPEN REPAIR OF LEFT INGUINAL HERNIA WITH MESH;  Surgeon: Tanda Locus, MD;  Location: [facility] ORS;   Service: General;  Laterality: Left;   LEAD REVISION/REPAIR N/A 05/15/2022   Procedure: LEAD REVISION/REPAIR;  Surgeon: [name] Ole DASEN, MD;  Location: [facility] INVASIVE CV LAB;  Service: Cardiovascular;  Laterality: N/A;   LEFT HEART CATHETERIZATION WITH CORONARY ANGIOGRAM N/A 08/10/2013   Procedure: LEFT HEART CATHETERIZATION WITH CORONARY ANGIOGRAM;  Surgeon: Candyce GORMAN Reek, MD;  Location: [facility] CATH LAB;  Service: Cardiovascular;  Laterality: N/A;   ORIF HUMERUS FRACTURE Left 08/12/2013   Procedure: OPEN REDUCTION INTERNAL FIXATION (ORIF) HUMERAL SHAFT FRACTURE;  Surgeon: Jerona LULLA Sage, MD;  Location: [facility] OR;  Service: Orthopedics;  Laterality: Left;  Open Reduction Internal Fixation Left Humerus   RIGHT/LEFT HEART CATH AND CORONARY ANGIOGRAPHY N/A 05/12/2022   Procedure: RIGHT/LEFT HEART CATH AND CORONARY ANGIOGRAPHY;  Surgeon: Gardenia Led, DO;  Location: [facility] INVASIVE CV LAB;  Service: Cardiovascular;  Laterality: N/A;   URETEROSCOPY     laser for kidney stones    Family History  Problem Relation Age of Onset   Diabetes Father    Arthritis Father    Hypertension Father    Diabetes Mother    Arthritis Mother    Hypertension Mother    Hypertension Brother     Social History:  reports that he quit smoking about 9 years ago. His smoking use included cigarettes. He started smoking about 37 years ago. He has a 28 pack-year smoking history. He has never used smokeless tobacco. He reports that he does not currently use alcohol. He reports current drug use. Drug: Marijuana.  Allergies:  Allergies  Allergen Reactions   Bee Venom Anaphylaxis   Trulicity  [Dulaglutide ] Anaphylaxis and Diarrhea    Extra dose of Trulicity  caused cardiac arrest. Patient experience onset of diarrhea with using this medication - cleared after med stopped.    Medications: I have reviewed the patient's current medications.  The PMH, PSH, Medications, Allergies, and SH were reviewed and  updated.  ROS: Constitutional: Negative for fever, weight loss and weight gain. Cardiovascular: Negative for chest pain and dyspnea on exertion. Respiratory: Is not experiencing shortness of breath at rest. Gastrointestinal: Negative for nausea and vomiting. Neurological: Negative for headaches. Psychiatric: The patient is not nervous/anxious  Blood pressure 109/71, pulse 84, SpO2 91%. BMI of 37 today   PHYSICAL EXAM:  Exam: General: Well-developed, well-nourished Respiratory Respiratory effort: Equal inspiration and expiration without stridor Cardiovascular Peripheral Vascular: Warm extremities with equal color/perfusion Eyes: No nystagmus with equal extraocular motion bilaterally Neuro/Psych/Balance: Patient oriented to person, place, and time; Appropriate mood and affect; Gait is intact with no imbalance; Cranial nerves I-XII are intact Head and Face Inspection: Normocephalic and atraumatic without mass or lesion Palpation: Facial skeleton intact without bony stepoffs Salivary Glands: No mass or tenderness Facial Strength: Facial motility symmetric and full bilaterally ENT Pinna: External ear intact and fully developed External canal: Canal is patent with intact skin Tympanic Membrane: Clear and mobile External  Nose: No scar or anatomic deformity Internal Nose: Septum is straight. No polyp, or purulence. Mucosal edema and erythema present.  Bilateral inferior turbinate hypertrophy.  Lips, Teeth, and gums: Mucosa and teeth intact and viable TMJ: No pain to palpation with full mobility Oral cavity/oropharynx: No erythema or exudate, no lesions present 2+ tonsils Friedman IV Nasopharynx: No mass or lesion with intact mucosa Hypopharynx: Intact mucosa without pooling of secretions Larynx Glottic: Full true vocal cord mobility without lesion or mass Supraglottic: Normal appearing epiglottis and AE folds Interarytenoid Space: Moderate pachydermia&edema Subglottic Space: Patent  without lesion or edema Neck Neck and Trachea: Midline trachea without mass or lesion Thyroid : No mass or nodularity Lymphatics: No lymphadenopathy  Procedure: Preoperative diagnosis: OSA and CPAP intolerance    Postoperative diagnosis:   Same large BOT and lateral pharyngeal wall collapse   Procedure: Flexible fiberoptic laryngoscopy  Surgeon: Elena Larry, MD  Anesthesia: Topical lidocaine  and Afrin Complications: None Condition is stable throughout exam  Indications and consent:  The patient presents to the clinic with above symptoms. Indirect laryngoscopy view was incomplete. Thus it was recommended that they undergo a flexible fiberoptic laryngoscopy. All of the risks, benefits, and potential complications were reviewed with the patient preoperatively and verbal informed consent was obtained.  Procedure: The patient was seated upright in the clinic. Topical lidocaine  and Afrin were applied to the nasal cavity. After adequate anesthesia had occurred, I then proceeded to pass the flexible telescope into the nasal cavity. The nasal cavity was patent without rhinorrhea or polyp. The nasopharynx was also patent without mass or lesion. The base of tongue was visualized and was normal. There were no signs of pooling of secretions in the piriform sinuses. The true vocal folds were mobile bilaterally. There were no signs of glottic or supraglottic mucosal lesion or mass. There was moderate interarytenoid pachydermia and post cricoid edema. The telescope was then slowly withdrawn and the patient tolerated the procedure throughout.   Studies Reviewed: The patient is a 58 year-old Male who is 6' and weighs 284.0 lbs. His BMI equals 38.9. A full night titration treatment study was performed. HST showed severe OSA with Ahi 71/h & low sat of 79%   Sleep study CPAP titration Respiratory Events The polysomnogram revealed a presence of 5 obstructive, - central, and - mixed apneas resulting in an  Apnea index of 1.7 events per hour.  There were 44 hypopneas (>=3% desaturation and/or arousal) resulting in an Apnea\Hypopnea Index (AHI >=3% desaturation and/or arousal) of 17.0 events per hour.  There were 30 hypopneas (>=4% desaturation) resulting in an Apnea\Hypopnea Index (AHI >=4% desaturation) of 12.2 events per hour.  There were - Respiratory Effort Related Arousals resulting in a RERA index of - events per hour. The Respiratory Disturbance Index is 17.0 events per hour.  The snore index was 339.5 events per hour.   Mean oxygen  saturation was 93.1%.  The lowest oxygen  saturation during sleep was 85.0%.  Time spent <=88% oxygen  saturation was 2.5 minutes (0.7%).  Assessment/Plan: Encounter Diagnoses  Name Primary?   Intolerance of continuous positive airway pressure (CPAP) ventilation Yes   OSA (obstructive sleep apnea)     Assessment and Plan Assessment & Plan Obstructive sleep apnea, Severe and CPAP intolerance Severe obstructive sleep apnea with intolerance to CPAP due to claustrophobia. BMI of 37. Concerns about surgical risks due to cardiac comorbidities. Oral appliance therapy may be less effective due to severity. I discussed risks and benefits of Inspire implant, and he is not  sure if his Cardiologist will clear him for surgery. Will inquire about it when he sees Cardiology - Obtain home sleep study records from [facility] - Consult cardiologist for surgical clearance for Inspire implant. - Refer to [facility] for alternative CPAP mask options. - Consider oral appliance therapy through oral surgery clinic although we discussed it might not be effective due to severity of his sleep apnea.  - consider GLP-1 Zepbound.    Thank you for allowing me to participate in the care of this patient. Please do not hesitate to contact me with any questions or concerns.   Elena Larry, MD Otolaryngology [facility] Phone: [phone] Fax:  [phone]    03/07/2024, 4:28 PM

## 2024-03-07 NOTE — Telephone Encounter (Unsigned)
 Copied from CRM [id number]. Topic: Clinical - Medication Refill >> Mar 07, 2024  1:41 PM Carlatta H wrote: Medication: pantoprazole  (PROTONIX ) 40 MG tablet  Has the patient contacted their pharmacy? No (Agent: If no, request that the patient contact the pharmacy for the refill. If patient does not wish to contact the pharmacy document the reason why and proceed with request.) (Agent: If yes, when and what did the pharmacy advise?)  This is the patient's preferred pharmacy:  [facility] [address], [place] - [address] [place] [address] Phone: [phone] Fax: [phone]  Is this the correct pharmacy for this prescription? Yes If no, delete pharmacy and type the correct one.   Has the prescription been filled recently? No  Is the patient out of the medication? Yes  Has the patient been seen for an appointment in the last year OR does the patient have an upcoming appointment? Yes  Can we respond through MyChart? No  Agent: Please be advised that Rx refills may take up to 3 business days. We ask that you follow-up with your pharmacy.

## 2024-03-08 ENCOUNTER — Telehealth: Payer: Self-pay | Admitting: Internal Medicine

## 2024-03-08 DIAGNOSIS — M545 Low back pain, unspecified: Secondary | ICD-10-CM

## 2024-03-08 NOTE — Progress Notes (Signed)
 EPIC Encounter for ICM Monitoring  Patient Name: [name] is a 58 y.o. male Date: 03/08/2024 Primary Care Physican: Vicci Barnie NOVAK, MD Primary Cardiologist: Crenshaw/[name] Electrophysiologist: [name] 08/24/2023 Office Weight: 295 lbs    09/28/2023 Weight: 291 lbs       10/13/2023  Weight: 280.2 lbs discharge weight 10/20/2023 Weight:  Not weighing at home    10/23/2023 Weight: Pt reports 5/27 he thinks he was 285 lbs at home 11/03/2023 Weight: 294 lbs (10 lb weight gain since 5/16) 12/15/2023 Office Weight: 289 lbs 01/02/2024 Hospital Weight: 285.5 lbs 01/13/2024 Weight: 277 lbs 02/02/2024 Weight: 280 lbs (was 274 lbs) 02/09/2024 Weight: 277 lbs 03/05/2024 Weight: 279 lbs (before 03/07/2024 Metolazone )   Spoke with patient and heart failure questions reviewed.  Transmission results reviewed.  Pt reports SOB walking from room to room in his home.  He reports he did not fluid pills during hospitalization since he had low potassium.  Hospitalized 02/25/2024-03/01/2024.   Since 02/09/2024 ICM Remote Transmission:  Corvue thoracic impedance suggesting possible fluid accumulation starting 02/27/2024 but returned close to normal (took Metolazone  on 03/07/2024).   Prescribed dosage:  Torsemide  20 mg take 2 tablet(s) (40 mg total) by mouth daily.  Potassium 20 mEq take 2 tablet(s) (40 mEq total) by mouth twice a day Metolazone  5 mg take 1 tablet by mouth once a week.  Pt takes on Mondays (Nephrologist said he could take it up to twice a week).     Labs: 03/01/2024 Creatinine 2.32, BUN 31, Potassium 4.3, Sodium 141, GFR 32  02/29/2024 Creatinine 2.46, BUN 37, Potassium 3.8, Sodium 138, GFR 29  02/28/2024 Creatinine 2.70, BUN 44, Potassium 3.7, Sodium 138, GFR 26  02/27/2024 Creatinine 2.93, BUN 54, Potassium 3.6, Sodium 136, GFR 24 02/26/2024 Creatinine 3.53, BUN 64, Potassium 3.6, Sodium 135, GFR 19 (2:24 PM) 02/26/2024 Creatinine 3.73, BUN 68, Potassium 3.2, Sodium 136, GFR 18 (7:33 AM) 02/25/2024  Creatinine 4.28, BUN 72, Potassium 3.0, Sodium 134, GFR 15  A complete set of results can be found in Results Review.   Recommendations:   No changes and encouraged to call if experiencing any fluid symptoms.   Follow-up plan: ICM clinic phone appointment on 03/21/2024 to recheck fluid levels.   91 day device clinic remote transmission 05/18/2024.     EP/Cardiology Office Visits:   03/10/2024 with HF Clinic post hospital follow up.   03/17/2024 with [name], PA.     Copy of ICM check sent to Dr [name].   Remote monitoring is medically necessary for Heart Failure Management.    90 day Daily Thoracic Impedance ICM trend: 12/08/2023 through 03/08/2024.    12-14 Month Thoracic Impedance ICM trend:     [name], RN 03/08/2024 3:47 PM

## 2024-03-08 NOTE — Telephone Encounter (Signed)
-----   Message from Altamese RAMAN sent at 03/08/2024  1:19 PM EDT ----- Regarding: RE: Pain Management Good Afternoon  Dr  Vicci   Place  a new  referral  and can you please, put in the notes  it would be best for him to be seen within the [facility] system so that we can easily access his notes for continuity of care. Thank you ----- Message ----- From: Vicci Barnie NOVAK, MD Sent: 03/07/2024   4:31 PM EDT To: Altamese FORBES Adler Subject: Pain Management                                So is there anyway we can switch to [facility]? He was sent to [facility] but he sees all specialist at [facility]. He has a lot of chronic medical conditions and it would be best for him to be seen within the [facility] system so that we can easily access his notes for continuity of care. Let me know if I have to resubmit a new referral.

## 2024-03-09 ENCOUNTER — Telehealth (HOSPITAL_COMMUNITY): Payer: Self-pay

## 2024-03-09 NOTE — Telephone Encounter (Signed)
 Called to confirm/remind patient of their appointment at the [facility] on 03/10/24. Patient was rescheduled for a later time per patient.  Appointment:   [x] Confirmed  [] Left mess   [] No answer/No voice mail  [] VM Full/unable to leave message  [] Phone not in service  Patient reminded to bring all medications and/or complete list.  Confirmed patient has transportation. Gave directions, instructed to utilize valet parking.

## 2024-03-10 ENCOUNTER — Ambulatory Visit (HOSPITAL_COMMUNITY): Payer: Self-pay | Admitting: Cardiology

## 2024-03-10 ENCOUNTER — Ambulatory Visit (HOSPITAL_COMMUNITY)
Admission: RE | Admit: 2024-03-10 | Discharge: 2024-03-10 | Disposition: A | Discharge status: Home / Self Care | Source: Ambulatory Visit | Attending: Family Medicine | Admitting: Family Medicine

## 2024-03-10 ENCOUNTER — Encounter (HOSPITAL_COMMUNITY)

## 2024-03-10 VITALS — BP 116/84 | HR 88 | Wt 276.0 lb

## 2024-03-10 DIAGNOSIS — Z95810 Presence of automatic (implantable) cardiac defibrillator: Secondary | ICD-10-CM | POA: Insufficient documentation

## 2024-03-10 DIAGNOSIS — J449 Chronic obstructive pulmonary disease, unspecified: Secondary | ICD-10-CM | POA: Diagnosis not present

## 2024-03-10 DIAGNOSIS — I5022 Chronic systolic (congestive) heart failure: Secondary | ICD-10-CM | POA: Insufficient documentation

## 2024-03-10 DIAGNOSIS — R5383 Other fatigue: Secondary | ICD-10-CM | POA: Diagnosis not present

## 2024-03-10 DIAGNOSIS — Z6837 Body mass index (BMI) 37.0-37.9, adult: Secondary | ICD-10-CM | POA: Diagnosis not present

## 2024-03-10 DIAGNOSIS — Z7901 Long term (current) use of anticoagulants: Secondary | ICD-10-CM | POA: Insufficient documentation

## 2024-03-10 DIAGNOSIS — E669 Obesity, unspecified: Secondary | ICD-10-CM | POA: Diagnosis not present

## 2024-03-10 DIAGNOSIS — E1122 Type 2 diabetes mellitus with diabetic chronic kidney disease: Secondary | ICD-10-CM | POA: Diagnosis not present

## 2024-03-10 DIAGNOSIS — Z79899 Other long term (current) drug therapy: Secondary | ICD-10-CM | POA: Insufficient documentation

## 2024-03-10 DIAGNOSIS — E876 Hypokalemia: Secondary | ICD-10-CM | POA: Insufficient documentation

## 2024-03-10 DIAGNOSIS — Z794 Long term (current) use of insulin: Secondary | ICD-10-CM | POA: Insufficient documentation

## 2024-03-10 DIAGNOSIS — G4733 Obstructive sleep apnea (adult) (pediatric): Secondary | ICD-10-CM | POA: Diagnosis not present

## 2024-03-10 DIAGNOSIS — Z86718 Personal history of other venous thrombosis and embolism: Secondary | ICD-10-CM | POA: Insufficient documentation

## 2024-03-10 DIAGNOSIS — N184 Chronic kidney disease, stage 4 (severe): Secondary | ICD-10-CM | POA: Insufficient documentation

## 2024-03-10 DIAGNOSIS — I4729 Other ventricular tachycardia: Secondary | ICD-10-CM | POA: Diagnosis not present

## 2024-03-10 LAB — BASIC METABOLIC PANEL WITH GFR
Anion gap: 18 — ABNORMAL HIGH (ref 5–15)
BUN: 25 mg/dL — ABNORMAL HIGH (ref 6–20)
CO2: 23 mmol/L (ref 22–32)
Calcium: 9.4 mg/dL (ref 8.9–10.3)
Chloride: 99 mmol/L (ref 98–111)
Creatinine, Ser: 3.09 mg/dL — ABNORMAL HIGH (ref 0.61–1.24)
GFR, Estimated: 22 mL/min — ABNORMAL LOW (ref >60)
Glucose, Bld: 214 mg/dL — ABNORMAL HIGH (ref 70–99)
Potassium: 3.8 mmol/L (ref 3.5–5.1)
Sodium: 140 mmol/L (ref 135–145)

## 2024-03-10 NOTE — Patient Instructions (Signed)
 There has been no changes to your medications.  Labs done today, your results will be available in MyChart, we will contact you for abnormal readings.  Your physician recommends that you schedule a follow-up appointment in: 3 months ( January 2026) ** PLEASE CALL THE OFFICE IN NOVEMBER TO ARRANGE YOUR FOLLOW UP APPOINTMENT.**  If you have any questions or concerns before your next appointment please send us  a message through [place] or call our office at [phone].    TO LEAVE A MESSAGE FOR THE NURSE SELECT OPTION 2, PLEASE LEAVE A MESSAGE INCLUDING: YOUR NAME DATE OF BIRTH CALL BACK NUMBER REASON FOR CALL**this is important as we prioritize the call backs  YOU WILL RECEIVE A CALL BACK THE SAME DAY AS LONG AS YOU CALL BEFORE 4:00 PM  At the [facility], you and your health needs are our priority. As part of our continuing mission to provide you with exceptional heart care, we have created designated Provider Care Teams. These Care Teams include your primary Cardiologist (physician) and Advanced Practice Providers (APPs- Physician Assistants and Nurse Practitioners) who all work together to provide you with the care you need, when you need it.   You may see any of the following providers on your designated Care Team at your next follow up: Dr Toribio Fuel Dr Ezra Shuck Dr. Ria Commander Dr. Morene Brownie Amy Lenetta, NP Caffie Shed, [place] [facility] [place] [facility] [place], [place] Beckey Coe, NP [place] Lee, NP Ellouise Class, NP Tinnie Redman, PharmD Jaun Bash, PharmD   Please be sure to bring in all your medications bottles to every appointment.    Thank you for choosing [facility]

## 2024-03-10 NOTE — Progress Notes (Addendum)
 ADVANCED HEART FAILURE CLINIC NOTE  Primary Care: [name] Barnie NOVAK, MD Primary Cardiologist: Dr. Pietro EP: Dr. [name] Pulm: Dr. Meade Nephrology: Dr. Jerrye HF Cardiologist: Dr. Gardenia  Reason for visit: F/u for heart failure    HPI: [name] is a 58 y.o. male with HFrEF (dx 2015) w/ LHC demonstrating normal coronary anatomy s/p primary prevention ICD, OSA unable to tolerate CPAP, CKDIIIB & T2DM.   In 2017 he had a CPX with w/ moderate functional impairment, peak VO2 of 15, VE/VCO2 of 35. From a functional standpoint, [name] reports running 2-3 miles daily until he was diagnosed with 'long covid. Since that time he has seen a sharp decline in exercise capacity.   In November 2023, he was admitted for VT believed to be secondary to significant electrolyte derrangements from and underlying right gluteal abscess. Throughout his admission he had recurrent device shocks for VT. He underwent RHC/LHC that demonstrated normal coronary anatomy with RHC showing normal cardiac output and low filling pressures. He subsequently underwent placement of right atrial lead with improvement in ectopy after atrial pacing. TTE during admission with LVEF of 30-35%.   Echo 3/24 EF 20-25%, moderate LVH, RV okay.    Admitted 10/24 with groin cellulitis. Treated with IV antibiotics. He is now off Farxiga .   Readmitted 11/24 with extensive left upper extremity DVT. He was started on Eliquis . Echo 11/24: EF 25-30%.   Echo 3/25: EF up to 40-45%, RV okay   Seen in ED 09/07/23 with RSV infection. A week later given course of doxycycline  d/t persistent symptoms.  Admitted 5/25 with AKI 2/2 decrease oral intake. SCr peaked at 4.6 (blaseline 2.7). GDMT held and received IVF. He was restarted on Toprol  and torsemide , discharged home, weight 279 lbs.  He was readmitted 07/12-07/29/25 with acute on chronic CHF, RLE DVT, gout, left-sided chest pain, left shoulder/back pain d/t  osteoarthritis/possible gout. Cardiology consulted for acute on chronic CHF. He was diuresed. GDMT limited by CKD. Chest pain felt to be most likely musculoskeletal in origin. Echo during admit with LVEF 40-45%, moderate LVH, RV okay.  Admitted 9/25 after a syncopal episode d/t polymorphic VT/VF, terminated w/ ATP. K was 2.8 on arrival by iSTAT, in the setting of metolazone  use (not taking K). K was aggressively repleated and he was loaded w/ IV amio and had no further VT. Transitioned to PO amiodarone . cMRI was attempted to r/o infiltrative process however pt unable to complete due to claustrophobia. From a HF standpoint, he was felt to be on the dry side given bump in BUN/SCr. Home diuretics were held while in the ICU but resumed at discharge.   He presents today for hospital f/u. Feels tired and fatigued. No resting dyspnea. Reports stable NYHA II-III symptoms. Compliant w/ medications. He still takes metolazone  q Mondays but not taking extra K on these days. Device interrogation shows no further VT episodes. Impedence was down earlier in the week but has improved after dose of metolazone . BP 116/84.     Current Outpatient Medications  Medication Sig Dispense Refill   amiodarone  (PACERONE ) 200 MG tablet Take 2 tablets (400 mg) twice daily for 2 weeks. Then on October 5, decrease to 1 tablet (200 mg) once daily. 40 tablet 11   atorvastatin  (LIPITOR) 10 MG tablet Take 1 tablet (10 mg total) by mouth daily. 90 tablet 2   benzonatate  (TESSALON ) 200 MG capsule Take 200 mg by mouth 2 (two) times daily as needed for cough.  cetirizine  (EQ ALLERGY RELIEF, CETIRIZINE ,) 10 MG tablet Take 1 tablet (10 mg total) by mouth daily. 90 tablet 1   cholecalciferol  (VITAMIN D3) 25 MCG (1000 UNIT) tablet Take 1,000 Units by mouth in the morning.     clonazePAM  (KLONOPIN ) 0.5 MG tablet Take 1 tablet (0.5 mg total) by mouth 2 (two) times daily as needed for anxiety. 60 tablet 1   Clotrimazole  1 % OINT Apply 1  Application topically 2 (two) times daily as needed (irritation).     colchicine  0.6 MG tablet Take 0.5 tablets (0.3 mg total) by mouth every Monday, Wednesday, and Friday.     Continuous Glucose Sensor (FREESTYLE LIBRE 2 PLUS SENSOR) MISC Change sensor every 15 days. 2 each 11   EPINEPHrine  0.3 mg/0.3 mL IJ SOAJ injection Inject 0.3 mg into the muscle as needed. 1 each 1   fluticasone  (FLONASE ) 50 MCG/ACT nasal spray Place 2 sprays into both nostrils daily. 16 g 6   gabapentin  (NEURONTIN ) 300 MG capsule Take 1 capsule (300 mg total) by mouth at bedtime. 90 capsule 1   HYDROcodone -acetaminophen  (NORCO/VICODIN) 5-325 MG tablet Take 1-2 tablets by mouth every 4 (four) hours as needed for moderate pain (pain score 4-6) or severe pain (pain score 7-10). 20 tablet 0   insulin  aspart (NOVOLOG  FLEXPEN) 100 UNIT/ML FlexPen Inject 22 Units into the skin 2 (two) times daily.     insulin  glargine (LANTUS  SOLOSTAR) 100 UNIT/ML Solostar Pen Inject 30 Units into the skin at bedtime.     Insulin  Pen Needle (TECHLITE PEN NEEDLES) 32G X 4 MM MISC Use to inject insulin  into the skin 3 times per day. 100 each 0   latanoprost (XALATAN) 0.005 % ophthalmic solution Place 1 drop into both eyes at bedtime.     magnesium  oxide (MAG-OX) 400 MG tablet Take 1 tablet (400 mg total) by mouth daily. 90 tablet 3   metolazone  (ZAROXOLYN ) 5 MG tablet Take 1 tablet (5 mg total) by mouth once a week. 12 tablet 2   metoprolol  succinate (TOPROL -XL) 25 MG 24 hr tablet Take 0.5 tablets (12.5 mg total) by mouth daily. 45 tablet 3   nystatin  (MYCOSTATIN /NYSTOP ) powder Apply 1 Application topically 3 (three) times daily. 60 g 2   pantoprazole  (PROTONIX ) 40 MG tablet Take 1 tablet (40 mg total) by mouth daily. 90 tablet 1   potassium chloride  SA (KLOR-CON  M) 20 MEQ tablet Take 2 tablets (40 mEq total) by mouth 2 (two) times daily. Take an extra 20 mEq when you take Metolazone . 120 tablet 11   prednisoLONE acetate (PRED FORTE) 1 % ophthalmic  suspension Place 1 drop into the left eye at bedtime.     rivaroxaban  (XARELTO ) 20 MG TABS tablet Take 1 tablet (20 mg total) by mouth daily with supper. 30 tablet 2   sertraline  (ZOLOFT ) 50 MG tablet Take 1.5 tablets (75 mg total) by mouth in the morning. 45 tablet 2   SYMBICORT  160-4.5 MCG/ACT inhaler Inhale 2 puffs into the lungs in the morning and at bedtime.     tamsulosin  (FLOMAX ) 0.4 MG CAPS capsule Take 2 capsules (0.8 mg total) by mouth daily. 180 capsule 3   torsemide  (DEMADEX ) 20 MG tablet Take 2 tablets (40 mg total) by mouth daily. 180 tablet 3   No current facility-administered medications for this encounter.   Wt Readings from Last 3 Encounters:  03/10/24 125.2 kg (276 lb)  03/01/24 126.6 kg (279 lb 1.6 oz)  02/16/24 127.9 kg (282 lb)   BP 116/84  Pulse 88   Wt 125.2 kg (276 lb)   SpO2 96%   BMI 37.43 kg/m   Physical Exam  GENERAL: obese, fatigued appearing, NAD Lungs- clear  CARDIAC:  JVP not elevated          Normal rate with regular rhythm. No MRG. No LEE  ABDOMEN: obese, soft, non-tender, non-distended.  EXTREMITIES: Warm and well perfused.  NEUROLOGIC: No obvious FND   DATA REVIEW Device interrogation (personally reviewed): No further VT episodes. Impedance was done earlier this wk but not up and above reference curve   ECG: Today: NSR  85 bpm (personally reviewed)  ECHO: 9/25: 45-50%, RV normal, severe LVH  12/22/23: EF 40-45%, moderate LVH, RV okay 09/03/23: EF 40-45%, moderate LVH, normal RV 04/26/23 LVEF 25%-30%, normal RV function.  08/22/2022: LVEF 35%, normal RV function 05/06/2022: LVEF 30-35%, normal RV function.  02/16/19: LVEF 45-50%, normal RV function.   CATH: 05/12/22: HEMODYNAMICS: RA:                  1 mmHg (mean) RV:                  22/1-3 mmHg PA:                  23/6 mmHg (12 mean) PCWP:            4 mmHg (mean)                                      Estimated Fick CO/CI   7 L/min, 2.9 L/min/m2                                                  TPG                 8  mmHg                                              PVR                 ~1 Wood Units  PAPi                >5       IMPRESSION: Low pre and post capillary filling pressures.  Normal cardiac output/cardiac index.  Normal PVR & PA mean No obstructive CAD, left dominant w/ large Lcx supplying multiple large marginals.   ICD interrogation: Thoracic impedance trending above threshold first week of August, below threshold X 2 days. No VT/VF, no AT/AF, 1.5% V paced  ASSESSMENT & PLAN:  Heart failure with reduced EF>>now HFmrEF - Nonischemic as demonstrated by coronary angiography 12/23; no family history  - NYHA III, chronic and confounded by obesity/deconditioning  - Euvolemic on exam and by device interrogation/CorVue  - Continue Torsemide  40 mg daily + once wkly Metolazone  q Mondays - Off Entresto  and spiro with CKD - Off SGLT2i w/ groin cellulitis - Continue Toprol  XL 12.5 mg daily - Hold off on imdur/hydralazine . Need to avoid hypotension with CKD - Check BMP today  - Will continue to monitor volume status w/ monthly remote device interrogations  -  Suspect recent VT was triggered by hypokalemia. If he continues to have recurrent VT will consider evaluation for sarcoidosis w/ repeat attempt at cMRI  2. VT - had episode 9/25 that terminated w/ ATP, in the setting of hypokalemia (2.8) - no further VT episodes on today's device interrogation  - Continue amiodarone  200 mg daily. Check LFTs and TSH next visit.  - Will need regular eye exam while on amio - Check BMP today   3. DM II - Uncontrolled, last A1c 8.5 - Avoid SGLT2i with hx of groin infections - Per PCP  4. CKD IV  - Baseline SCr 2.5-2.7 - followed by nephrology  - check BMP today    5. Hx DVT - Recurrent - Recently found to have RLE DVT while on Eliquis  - Now on Xarelto  20 mg daily.  - followed by hematology  6. OSA - severe by sleep study - just received new machine. Unable  to tolerate. He has been sent to ENT for possible Inspire device   7. COPD - Follows with Dr. Meade - On Symbicort   F/u w/ Dr. Gardenia in 3 months   Caffie Shed, PA-C 03/10/24

## 2024-03-11 ENCOUNTER — Other Ambulatory Visit: Payer: Self-pay | Admitting: *Deleted

## 2024-03-11 NOTE — Patient Outreach (Signed)
 Complex Care Management   Visit Note  03/11/2024  Name:  [name] MRN: [id number] DOB: [birth year]  Situation: Referral received for Complex Care Management related to Diabetes and CHF I obtained verbal consent from Patient.  Visit completed with Patient  on the phone  Background:   Past Medical History:  Diagnosis Date   AICD (automatic cardioverter/defibrillator) present    Chronic bronchitis (HCC)    get it most q yr (12/23/2013)   Chronic systolic (congestive) heart failure (HCC)    Fracture of left humerus    a. 07/2013.   GERD (gastroesophageal reflux disease)    not at present time   Heart murmur    born w/it    History of renal calculi    Hypertension    Kidney stones    NICM (nonischemic cardiomyopathy) (HCC)    a. 07/2013 Echo: EF 20-25%, diff HK, Gr2 DD, mild MR, mod dil LA/RA. 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  He reports that he is not feeling well.  He reports increased back pain.  He has requested to reschedule appointment.  BSW following patient for resources.   Patient's appointment rescheduled for 03/24/24 @ 3:30 pm.  Patient advised patient that he continues to not feel well to reach out to PCP or go to ED.   Patient Reported Symptoms:  Cognitive        Neurological      HEENT        Cardiovascular      Respiratory      Endocrine      Gastrointestinal        Genitourinary      Integumentary      Musculoskeletal          Psychosocial       Quality of Family Relationships: helpful, involved, supportive Do you feel physically threatened by others?: No    03/11/2024    PHQ2-9 Depression Screening   Little interest or pleasure in doing things     Feeling down, depressed, or hopeless    PHQ-2 - Total Score    Trouble falling or staying asleep, or sleeping too much    Feeling tired or having little energy    Poor appetite or overeating     Feeling bad about yourself - or that you are a failure or have let yourself or your family down    Trouble concentrating on things, such as reading the newspaper or watching television    Moving or speaking so slowly that other people could have noticed.  Or the opposite - being so fidgety or restless that you have been moving around a lot more than usual    Thoughts that you would be better off dead, or hurting yourself in some way    PHQ2-9 Total Score    If you checked off any problems, how difficult have these problems made it for you to do your work, take care of things at home, or get along with other people    Depression Interventions/Treatment      There were no vitals filed for this visit.  Medications Reviewed Today   Medications were not reviewed in this encounter  Recommendation:   PCP Follow-up Continue Current Plan of Care  Follow Up Plan:   Telephone follow-up 2 Weeks: 03/24/24 @ 3:30 pm  Demonta Wombles, RN, BSN, ACM RN Care Manager [employer] [phone]

## 2024-03-11 NOTE — Patient Instructions (Signed)
 Visit Information  Thank you for taking time to visit with me today. Please don't hesitate to contact me if I can be of assistance to you before our next scheduled appointment. I have sorry you where not able to complete the outreach today.  I have rescheduled appointment for 03/24/24 @ 3:30 pm.   Your next care management appointment is by telephone on 03/24/24 @  at 3:30 pm.  Please call the care guide team at [phone] if you need to cancel, schedule, or reschedule an appointment.   Please call the Suicide and Crisis Lifeline: 988 call the [place]  National Suicide Prevention Lifeline: (346) 162-6561 or TTY: 332-421-9555 TTY 973-356-0831) to talk to a trained counselor call 1-800-273-TALK (toll free, 24 hour hotline) go to [facility] [address], [place] [phone]) call 911 if you are experiencing a Mental Health or Behavioral Health Crisis or need someone to talk to.  Jacquelina Hewins, RN, BSN, Theatre manager [employer] [phone]

## 2024-03-15 ENCOUNTER — Ambulatory Visit

## 2024-03-16 ENCOUNTER — Emergency Department (HOSPITAL_COMMUNITY)

## 2024-03-16 ENCOUNTER — Other Ambulatory Visit: Payer: Self-pay

## 2024-03-16 ENCOUNTER — Inpatient Hospital Stay (HOSPITAL_COMMUNITY)
Admission: EM | Admit: 2024-03-16 | Discharge: 2024-03-24 | DRG: 682 | Disposition: A | Discharge status: Home / Self Care | Attending: Internal Medicine | Admitting: Internal Medicine

## 2024-03-16 ENCOUNTER — Telehealth: Payer: Self-pay | Admitting: *Deleted

## 2024-03-16 ENCOUNTER — Encounter (HOSPITAL_COMMUNITY): Payer: Self-pay

## 2024-03-16 DIAGNOSIS — R079 Chest pain, unspecified: Secondary | ICD-10-CM | POA: Diagnosis not present

## 2024-03-16 DIAGNOSIS — N184 Chronic kidney disease, stage 4 (severe): Secondary | ICD-10-CM | POA: Diagnosis present

## 2024-03-16 DIAGNOSIS — R0602 Shortness of breath: Secondary | ICD-10-CM | POA: Diagnosis not present

## 2024-03-16 DIAGNOSIS — J189 Pneumonia, unspecified organism: Secondary | ICD-10-CM | POA: Diagnosis not present

## 2024-03-16 DIAGNOSIS — K219 Gastro-esophageal reflux disease without esophagitis: Secondary | ICD-10-CM | POA: Diagnosis present

## 2024-03-16 DIAGNOSIS — Z87891 Personal history of nicotine dependence: Secondary | ICD-10-CM

## 2024-03-16 DIAGNOSIS — Z833 Family history of diabetes mellitus: Secondary | ICD-10-CM

## 2024-03-16 DIAGNOSIS — R0789 Other chest pain: Secondary | ICD-10-CM | POA: Diagnosis not present

## 2024-03-16 DIAGNOSIS — E1122 Type 2 diabetes mellitus with diabetic chronic kidney disease: Secondary | ICD-10-CM | POA: Diagnosis present

## 2024-03-16 DIAGNOSIS — R918 Other nonspecific abnormal finding of lung field: Secondary | ICD-10-CM | POA: Diagnosis not present

## 2024-03-16 DIAGNOSIS — Z86718 Personal history of other venous thrombosis and embolism: Secondary | ICD-10-CM

## 2024-03-16 DIAGNOSIS — I5022 Chronic systolic (congestive) heart failure: Secondary | ICD-10-CM | POA: Diagnosis present

## 2024-03-16 DIAGNOSIS — I130 Hypertensive heart and chronic kidney disease with heart failure and stage 1 through stage 4 chronic kidney disease, or unspecified chronic kidney disease: Secondary | ICD-10-CM | POA: Diagnosis present

## 2024-03-16 DIAGNOSIS — Z8261 Family history of arthritis: Secondary | ICD-10-CM

## 2024-03-16 DIAGNOSIS — I517 Cardiomegaly: Secondary | ICD-10-CM | POA: Diagnosis not present

## 2024-03-16 DIAGNOSIS — Z79899 Other long term (current) drug therapy: Secondary | ICD-10-CM

## 2024-03-16 DIAGNOSIS — Z91030 Bee allergy status: Secondary | ICD-10-CM

## 2024-03-16 DIAGNOSIS — N179 Acute kidney failure, unspecified: Secondary | ICD-10-CM | POA: Diagnosis not present

## 2024-03-16 DIAGNOSIS — Z9049 Acquired absence of other specified parts of digestive tract: Secondary | ICD-10-CM

## 2024-03-16 DIAGNOSIS — F39 Unspecified mood [affective] disorder: Secondary | ICD-10-CM | POA: Diagnosis present

## 2024-03-16 DIAGNOSIS — M109 Gout, unspecified: Secondary | ICD-10-CM | POA: Diagnosis present

## 2024-03-16 DIAGNOSIS — Z7901 Long term (current) use of anticoagulants: Secondary | ICD-10-CM

## 2024-03-16 DIAGNOSIS — Z8674 Personal history of sudden cardiac arrest: Secondary | ICD-10-CM

## 2024-03-16 DIAGNOSIS — Z59868 Other specified financial insecurity: Secondary | ICD-10-CM

## 2024-03-16 DIAGNOSIS — Z794 Long term (current) use of insulin: Secondary | ICD-10-CM

## 2024-03-16 DIAGNOSIS — Z95810 Presence of automatic (implantable) cardiac defibrillator: Secondary | ICD-10-CM

## 2024-03-16 DIAGNOSIS — Z7951 Long term (current) use of inhaled steroids: Secondary | ICD-10-CM

## 2024-03-16 DIAGNOSIS — I428 Other cardiomyopathies: Secondary | ICD-10-CM | POA: Diagnosis present

## 2024-03-16 DIAGNOSIS — I4891 Unspecified atrial fibrillation: Secondary | ICD-10-CM | POA: Diagnosis present

## 2024-03-16 DIAGNOSIS — Z8249 Family history of ischemic heart disease and other diseases of the circulatory system: Secondary | ICD-10-CM

## 2024-03-16 DIAGNOSIS — G4733 Obstructive sleep apnea (adult) (pediatric): Secondary | ICD-10-CM | POA: Diagnosis present

## 2024-03-16 DIAGNOSIS — Z1152 Encounter for screening for COVID-19: Secondary | ICD-10-CM

## 2024-03-16 DIAGNOSIS — N400 Enlarged prostate without lower urinary tract symptoms: Secondary | ICD-10-CM | POA: Diagnosis present

## 2024-03-16 LAB — COMPREHENSIVE METABOLIC PANEL WITH GFR
ALT: 20 U/L (ref 0–44)
AST: 17 U/L (ref 15–41)
Albumin: 3.5 g/dL (ref 3.5–5.0)
Alkaline Phosphatase: 66 U/L (ref 38–126)
Anion gap: 19 — ABNORMAL HIGH (ref 5–15)
BUN: 39 mg/dL — ABNORMAL HIGH (ref 6–20)
CO2: 30 mmol/L (ref 22–32)
Calcium: 9.6 mg/dL (ref 8.9–10.3)
Chloride: 90 mmol/L — ABNORMAL LOW (ref 98–111)
Creatinine, Ser: 3.46 mg/dL — ABNORMAL HIGH (ref 0.61–1.24)
GFR, Estimated: 20 mL/min — ABNORMAL LOW (ref >60)
Glucose, Bld: 225 mg/dL — ABNORMAL HIGH (ref 70–99)
Potassium: 3.7 mmol/L (ref 3.5–5.1)
Sodium: 139 mmol/L (ref 135–145)
Total Bilirubin: 1.6 mg/dL — ABNORMAL HIGH (ref 0.0–1.2)
Total Protein: 8.2 g/dL — ABNORMAL HIGH (ref 6.5–8.1)

## 2024-03-16 LAB — CBC
HCT: 39.2 % (ref 39.0–52.0)
Hemoglobin: 12.9 g/dL — ABNORMAL LOW (ref 13.0–17.0)
MCH: 29.9 pg (ref 26.0–34.0)
MCHC: 32.9 g/dL (ref 30.0–36.0)
MCV: 90.7 fL (ref 80.0–100.0)
Platelets: 282 K/uL (ref 150–400)
RBC: 4.32 MIL/uL (ref 4.22–5.81)
RDW: 13.9 % (ref 11.5–15.5)
WBC: 9.7 K/uL (ref 4.0–10.5)
nRBC: 0 % (ref 0.0–0.2)

## 2024-03-16 LAB — RESP PANEL BY RT-PCR (RSV, FLU A&B, COVID)  RVPGX2
Influenza A by PCR: NEGATIVE
Influenza B by PCR: NEGATIVE
Resp Syncytial Virus by PCR: NEGATIVE
SARS Coronavirus 2 by RT PCR: NEGATIVE

## 2024-03-16 LAB — TROPONIN I (HIGH SENSITIVITY)
Troponin I (High Sensitivity): 14 ng/L (ref <18)
Troponin I (High Sensitivity): 11 ng/L (ref <18)

## 2024-03-16 LAB — BRAIN NATRIURETIC PEPTIDE: B Natriuretic Peptide: 21.2 pg/mL (ref 0.0–100.0)

## 2024-03-16 LAB — MAGNESIUM: Magnesium: 1.7 mg/dL (ref 1.7–2.4)

## 2024-03-16 MED ORDER — MORPHINE SULFATE (PF) 4 MG/ML IV SOLN
4.0000 mg | Freq: Once | INTRAVENOUS | Status: AC
  Administered 2024-03-16: 4 mg via INTRAVENOUS
  Filled 2024-03-16: qty 1

## 2024-03-16 MED ORDER — ACETAMINOPHEN 500 MG PO TABS: 1000.0000 mg | ORAL_TABLET | Freq: Once | ORAL | Status: DC

## 2024-03-16 MED ORDER — LIDOCAINE 5 % EX PTCH
1.0000 | MEDICATED_PATCH | CUTANEOUS | Status: DC
  Filled 2024-03-16 (×4): qty 1

## 2024-03-16 NOTE — ED Triage Notes (Addendum)
 Patient arrives via [name] for chest pain. CP x4 days progressively worsening, unable to ambulate due to dyspnea and pain. Recently dc from hospital for aspiration pneumonia. Patient endorsed Dizziness, shob, and CP to EMS, but dizziness and shob have subsided. Patient describes stabbing pain across chest. Hx of afib. 12 lead NSR w EMS. Patient refused nitroglycerin  and ASA. 2 failed peripheral IV starts.   EMS vitals HR 92 BP 120/70 O2 95% RA CBG 206

## 2024-03-16 NOTE — ED Notes (Signed)
 Unsuccessful peripheral IV attempt x2 by RN. MD notified

## 2024-03-16 NOTE — ED Notes (Signed)
 IV team at bedside

## 2024-03-16 NOTE — Progress Notes (Addendum)
 Complex Care Management Care Guide Note  03/16/2024 Name: [name] MRN: [id number] DOB: 1966-04-18  [name] is a 58 y.o. year old male who is a primary care patient of Vicci Barnie NOVAK, MD and is actively engaged with the care management team. I reached out to Cheryal JINNY Clarity by phone today to assist with re-scheduling  with the RN Case Manager.  Care Management Note.   During a call to reschedule appointment, patient reported experiencing chest pain and shortness of breath (SOB). Patient stated intention to take a shower and then call 911. I advised the patient that I would call 911 immediately due to the severity of symptoms. Patient then disconnected the call and requested that I not contact emergency services. Despite the patient's request, I proceeded to call 911 on the patient's behalf out of concern for their safety. I provided the patient's address and relevant information to EMS dispatch. The EMS Event Number is [id number]. I spoke with a dispatcher identified by [facility] Number [id number].  Follow up: Will call back to reschedule call with RNCM  Harlene Satterfield  [facility], [facility] Guide  Direct Dial: [phone]  Fax [phone]

## 2024-03-16 NOTE — ED Provider Notes (Signed)
 [place] EMERGENCY DEPARTMENT AT [facility] Provider Note   CSN: [id number] Arrival date & time: 03/16/24  1515     History  Chief Complaint  Patient presents with   Chest Pain    [name] is a 58 y.o. male with HFrEF, NICM/AICD, OSA unable to tolerate CPAP, CKD 3B, T2DM PMH as listed below who presents with CP, SOB, dizziness. Patient arrives via [facility] for chest pain. CP x4 days progressively worsening, unable to ambulate due to dyspnea and pain. Recently dc from hospital for aspiration pneumonia. Patient endorsed Dizziness, shob, and CP to EMS, but dizziness and shob have subsided. Patient describes stabbing pain across chest. Hx of afib. 12 lead NSR w EMS. Patient refused nitroglycerin  and ASA.   Per chart review patient was admitted from 02/25/2024 to 03/01/2024 for polymorphic V. tach in the setting of AKI, hypokalemia, and recent NSAID use.  Started on amiodarone . Takes xarelto , no missed doses.   Past Medical History:  Diagnosis Date   AICD (automatic cardioverter/defibrillator) present    Chronic bronchitis (HCC)    get it most q yr (12/23/2013)   Chronic systolic (congestive) heart failure (HCC)    Fracture of left humerus    a. 07/2013.   GERD (gastroesophageal reflux disease)    not at present time   Heart murmur    born w/it    History of renal calculi    Hypertension    Kidney stones    NICM (nonischemic cardiomyopathy) (HCC)    a. 07/2013 Echo: EF 20-25%, diff HK, Gr2 DD, mild MR, mod dil LA/RA. 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 Authorizing Provider  amiodarone  (PACERONE ) 200 MG tablet Take 2 tablets (400 mg) twice daily for 2  weeks. Then on October 5, decrease to 1 tablet (200 mg) once daily. 03/01/24   Sigdel, Derryl, MD  atorvastatin  (LIPITOR) 10 MG tablet Take 1 tablet (10 mg total) by mouth daily. 12/14/23   Vicci Barnie NOVAK, MD  benzonatate  (TESSALON ) 200 MG capsule Take 200 mg by mouth 2 (two) times daily as needed for cough. 11/01/23   [provider]  cetirizine  (EQ ALLERGY RELIEF, CETIRIZINE ,) 10 MG tablet Take 1 tablet (10 mg total) by mouth daily. 09/30/23   Vicci Barnie NOVAK, MD  cholecalciferol  (VITAMIN D3) 25 MCG (1000 UNIT) tablet Take 1,000 Units by mouth in the morning.    [provider]  clonazePAM  (KLONOPIN ) 0.5 MG tablet Take 1 tablet (0.5 mg total) by mouth 2 (two) times daily as needed for anxiety. 02/18/24 02/17/25  Carvin Arvella HERO, MD  Clotrimazole  1 % OINT Apply 1 Application topically 2 (two) times daily as needed (irritation). 02/28/24   Lelon Hamilton T, PA-C  colchicine  0.6 MG tablet Take 0.5 tablets (0.3 mg total) by mouth every Monday, Wednesday, and Friday. 02/29/24   Lelon Hamilton T, PA-C  Continuous Glucose Sensor (FREESTYLE LIBRE 2 PLUS SENSOR) MISC Change sensor every 15 days. 02/17/24   Vicci Barnie NOVAK, MD  EPINEPHrine  0.3 mg/0.3 mL IJ SOAJ injection Inject 0.3 mg into the muscle as needed. 01/30/22   Iva Marty Saltness, MD  fluticasone  (FLONASE ) 50  MCG/ACT nasal spray Place 2 sprays into both nostrils daily. 11/16/23   Hedges, Reyes, PA-C  gabapentin  (NEURONTIN ) 300 MG capsule Take 1 capsule (300 mg total) by mouth at bedtime. 11/25/23   Vicci Barnie NOVAK, MD  HYDROcodone -acetaminophen  (NORCO/VICODIN) 5-325 MG tablet Take 1-2 tablets by mouth every 4 (four) hours as needed for moderate pain (pain score 4-6) or severe pain (pain score 7-10). 03/01/24   Mcarthur Pick, MD  insulin  aspart (NOVOLOG  FLEXPEN) 100 UNIT/ML FlexPen Inject 22 Units into the skin 2 (two) times daily. 02/28/24   Lelon Hamilton T, PA-C  insulin  glargine (LANTUS  SOLOSTAR) 100 UNIT/ML Solostar Pen  Inject 30 Units into the skin at bedtime. 02/28/24   Lelon Hamilton T, PA-C  Insulin  Pen Needle (TECHLITE PEN NEEDLES) 32G X 4 MM MISC Use to inject insulin  into the skin 3 times per day. 10/13/23   Regalado, Belkys A, MD  latanoprost (XALATAN) 0.005 % ophthalmic solution Place 1 drop into both eyes at bedtime. 02/16/24   [provider]  magnesium  oxide (MAG-OX) 400 MG tablet Take 1 tablet (400 mg total) by mouth daily. 03/01/24   Sigdel, Santosh, MD  metolazone  (ZAROXOLYN ) 5 MG tablet Take 1 tablet (5 mg total) by mouth once a week. 11/25/23 03/10/25  Vicci Barnie NOVAK, MD  metoprolol  succinate (TOPROL -XL) 25 MG 24 hr tablet Take 0.5 tablets (12.5 mg total) by mouth daily. 12/14/23   Vicci Barnie NOVAK, MD  nystatin  (MYCOSTATIN /NYSTOP ) powder Apply 1 Application topically 3 (three) times daily. 11/25/23   Vicci Barnie NOVAK, MD  pantoprazole  (PROTONIX ) 40 MG tablet Take 1 tablet (40 mg total) by mouth daily. 03/07/24   Vicci Barnie NOVAK, MD  potassium chloride  SA (KLOR-CON  M) 20 MEQ tablet Take 2 tablets (40 mEq total) by mouth 2 (two) times daily. Take an extra 20 mEq when you take Metolazone . 03/01/24   Mcarthur Pick, MD  prednisoLONE acetate (PRED FORTE) 1 % ophthalmic suspension Place 1 drop into the left eye at bedtime. 02/09/24   [provider]  rivaroxaban  (XARELTO ) 20 MG TABS tablet Take 1 tablet (20 mg total) by mouth daily with supper. 02/05/24   Vicci Barnie NOVAK, MD  sertraline  (ZOLOFT ) 50 MG tablet Take 1.5 tablets (75 mg total) by mouth in the morning. 11/19/23 11/18/24  Carvin Arvella HERO, MD  SYMBICORT  160-4.5 MCG/ACT inhaler Inhale 2 puffs into the lungs in the morning and at bedtime. 08/28/23   [provider]  tamsulosin  (FLOMAX ) 0.4 MG CAPS capsule Take 2 capsules (0.8 mg total) by mouth daily. 11/25/23   Vicci Barnie NOVAK, MD  torsemide  (DEMADEX ) 20 MG tablet Take 2 tablets (40 mg total) by mouth daily. 11/04/23   Glena Harlene HERO, FNP      Allergies    Bee venom  and Trulicity  [dulaglutide ]    Review of Systems   Review of Systems A 10 point review of systems was performed and is negative unless otherwise reported in HPI.  Physical Exam Updated Vital Signs Ht 6' (1.829 m)   Wt 121.6 kg   BMI 36.35 kg/m  Physical Exam General: Uncomfortable appearing male, lying in bed.  HEENT: PERRLA, Sclera anicteric, MMM, trachea midline.  Cardiology: RRR, no murmurs/rubs/gallops. Uncomfortable in his right chest with movement of R arm for IV, but not to palpation. Resp: Normal respiratory rate and effort. CTAB, no wheezes, rhonchi, crackles.  Abd: Soft, non-tender, non-distended. No rebound tenderness or guarding.  GU: Deferred. MSK: No peripheral edema or signs of trauma. Extremities without deformity  or TTP. No cyanosis or clubbing. Skin: warm, dry.  Neuro: A&Ox4, CNs II-XII grossly intact. MAEs. Sensation grossly intact.  Psych: Normal mood and affect.   ED Results / Procedures / Treatments   Labs (all labs ordered are listed, but only abnormal results are displayed) Labs Reviewed  CBC - Abnormal; Notable for the following components:      Result Value   Hemoglobin 12.9 (*)    All other components within normal limits  COMPREHENSIVE METABOLIC PANEL WITH GFR - Abnormal; Notable for the following components:   Chloride 90 (*)    Glucose, Bld 225 (*)    BUN 39 (*)    Creatinine, Ser 3.46 (*)    Total Protein 8.2 (*)    Total Bilirubin 1.6 (*)    GFR, Estimated 20 (*)    Anion gap 19 (*)    All other components within normal limits  CBG MONITORING, ED - Abnormal; Notable for the following components:   Glucose-Capillary 353 (*)    All other components within normal limits  RESP PANEL BY RT-PCR (RSV, FLU A&B, COVID)  RVPGX2  BRAIN NATRIURETIC PEPTIDE  MAGNESIUM   TROPONIN I (HIGH SENSITIVITY)  TROPONIN I (HIGH SENSITIVITY)    EKG EKG Interpretation Date/Time:  Wednesday March 16 2024 15:29:58 EDT Ventricular Rate:  87 PR  Interval:  199 QRS Duration:  129 QT Interval:  378 QTC Calculation: 455 R Axis:   169  Text Interpretation: Sinus rhythm Nonspecific intraventricular conduction delay Anteroseptal infarct, age indeterminate Similar to prior Confirmed by Franklyn Gills [phone]) on 03/16/2024 6:19:35 PM  Radiology CXR: Cardiomegaly with prominent central vascular markings. Findings could represent vascular congestion. No overt pulmonary edema.  CT chest wo contrast: 1. Cardiac enlargement. 2. Linear infiltration or atelectasis in the lung bases, greater on the left, similar to prior study. Developing patchy airspace infiltrates in the left upper lung. This likely represents developing pneumonia superimposed upon chronic fibrosis and atelectasis. 3. Bilateral adrenal gland nodules demonstrate long-term stability, likely benign. No imaging follow-up is indicated.  Procedures Procedures    Medications Ordered in ED Medications  morphine  (PF) 4 MG/ML injection 4 mg (4 mg Intravenous Given 03/16/24 1646)  morphine  (PF) 4 MG/ML injection 4 mg (4 mg Intravenous Given 03/16/24 2240)  cefTRIAXone  (ROCEPHIN ) 1 g in sodium chloride  0.9 % 100 mL IVPB (0 g Intravenous Stopped 03/17/24 0149)  azithromycin  (ZITHROMAX ) 500 mg in sodium chloride  0.9 % 250 mL IVPB (0 mg Intravenous Stopped 03/17/24 0436)    ED Course/ Medical Decision Making/ A&P                          Medical Decision Making Amount and/or Complexity of Data Reviewed Labs: ordered. Decision-making details documented in ED Course. Radiology: ordered. Decision-making details documented in ED Course.  Risk Prescription drug management. Decision regarding hospitalization.    This patient presents to the ED for concern of chest pain, this involves an extensive number of treatment options, and is a complaint that carries with it a high risk of complications and morbidity.  I considered the following differential and admission for this acute,  potentially life threatening condition.   MDM:    DDX for chest pain includes but is not limited to:  Chest x-ray with cardiomegaly and prominent central vascular markings that are possibly vascular congestion.  He is negative viral panel, negative BNP, and negative troponin x 2.  Must consider PE given his recent hospitalization but patient  does take Xarelto  and reports compliance with his medications, do not believe PE is the most likely diagnosis. Does seem to have chest discomfort with movement of R arm, no swelling of R arm or shoulder, doesn't seem to be tender to palpatino, but also consider MSK pain.  Performed a CT chest without contrast which showed possible developing pneumonia in the left upper lung.  Patient is treated with ceftriaxone  and azithromycin .  This could be contributing to his chest pain, though his chest pain is on the right side.  He has a very high heart score and believe he is a high risk patient for ACS.  No symptoms of volume overload and no pulmonary edema noted on the chest CT that would indicate a heart failure exacerbation.  He has not been shocked by the ICD and has no recurrent V. tach or V-fib here in the emergency department on telemetry.  Patient be consulted to cardiology.  He has also mild AKI on CKD.   Clinical Course as of 03/24/24 2146  Wed Mar 16, 2024  1718 DG Chest 2 View Cardiomegaly with prominent central vascular markings. Findings could represent vascular congestion. No overt pulmonary edema.   [HN]  1818 Creatinine(!): 3.46 AKI on CKD, baseline 2.3-2.6 [HN]  1818 Resp panel by RT-PCR (RSV, Flu A&B, Covid) Anterior Nasal Swab neg [HN]  1924 B Natriuretic Peptide: 21.2 neg [HN]  1924 Troponin I (High Sensitivity): 14 neg [HN]  1924 Resp panel by RT-PCR (RSV, Flu A&B, Covid) Anterior Nasal Swab neg [HN]  2028 Troponin I (High Sensitivity): 11 Neg x2 [HN]  2045 CT Chest Wo Contrast 1. Cardiac enlargement. 2. Linear infiltration or  atelectasis in the lung bases, greater on the left, similar to prior study. Developing patchy airspace infiltrates in the left upper lung. This likely represents developing pneumonia superimposed upon chronic fibrosis and atelectasis. 3. Bilateral adrenal gland nodules demonstrate long-term stability, likely benign. No imaging follow-up is indicated.   [HN]    Clinical Course User Index [HN] Franklyn Sid SAILOR, MD    Labs: I Ordered, and personally interpreted labs.  The pertinent results include:  those listed above  Imaging Studies ordered: I ordered imaging studies including CXR, CT Chest wo contrast I independently visualized and interpreted imaging. I agree with the radiologist interpretation  Additional history obtained from chart review.   Cardiac Monitoring: The patient was maintained on a cardiac monitor.  I personally viewed and interpreted the cardiac monitored which showed an underlying rhythm of: NSR  Reevaluation: After the interventions noted above, I reevaluated the patient and found that they have :improved  Social Determinants of Health: Lives independently  Disposition: Signed out to oncoming emergency medicine provider Dr. Trine pending cardiac consultation and recommendations.  Co morbidities that complicate the patient evaluation  Past Medical History:  Diagnosis Date   AICD (automatic cardioverter/defibrillator) present    Chronic bronchitis (HCC)    get it most q yr (12/23/2013)   Chronic systolic (congestive) heart failure (HCC)    Fracture of left humerus    a. 07/2013.   GERD (gastroesophageal reflux disease)    not at present time   Heart murmur    born w/it    History of renal calculi    Hypertension    Kidney stones    NICM (nonischemic cardiomyopathy) (HCC)    a. 07/2013 Echo: EF 20-25%, diff HK, Gr2 DD, mild MR, mod dil LA/RA. EF 40% 2017 echo   Obesity    Other disorders  of the pituitary and other syndromes of  diencephalohypophyseal origin    Shockable heart rhythm detected by automated external defibrillator    Syncope    Type II diabetes mellitus (HCC) 2006     Medicines No orders of the defined types were placed in this encounter.   I have reviewed the patients home medicines and have made adjustments as needed  Problem List / ED Course: Problem List Items Addressed This Visit       Genitourinary   * (Principal) AKI (acute kidney injury)     Other   Chest pain - Primary   Other Visit Diagnoses       Community acquired pneumonia of left lower lobe of lung       Relevant Medications   cefTRIAXone  (ROCEPHIN ) 1 g in sodium chloride  0.9 % 100 mL IVPB (Completed)   azithromycin  (ZITHROMAX ) 500 mg in sodium chloride  0.9 % 250 mL IVPB (Completed)   azithromycin  (ZITHROMAX ) tablet 500 mg (Completed)   amoxicillin -clavulanate (AUGMENTIN ) 875-125 MG per tablet 1 tablet (Completed)                   This note was created using dictation software, which may contain spelling or grammatical errors.    Franklyn Sid SAILOR, MD 03/24/24 [phone]

## 2024-03-17 ENCOUNTER — Encounter (HOSPITAL_COMMUNITY): Payer: Self-pay | Admitting: Hospitalist

## 2024-03-17 ENCOUNTER — Emergency Department (HOSPITAL_COMMUNITY)

## 2024-03-17 ENCOUNTER — Ambulatory Visit: Admitting: Physician Assistant

## 2024-03-17 DIAGNOSIS — Z87891 Personal history of nicotine dependence: Secondary | ICD-10-CM | POA: Diagnosis not present

## 2024-03-17 DIAGNOSIS — R079 Chest pain, unspecified: Secondary | ICD-10-CM

## 2024-03-17 DIAGNOSIS — Z79899 Other long term (current) drug therapy: Secondary | ICD-10-CM | POA: Diagnosis not present

## 2024-03-17 DIAGNOSIS — M109 Gout, unspecified: Secondary | ICD-10-CM | POA: Diagnosis not present

## 2024-03-17 DIAGNOSIS — Z794 Long term (current) use of insulin: Secondary | ICD-10-CM | POA: Diagnosis not present

## 2024-03-17 DIAGNOSIS — Z95810 Presence of automatic (implantable) cardiac defibrillator: Secondary | ICD-10-CM | POA: Diagnosis not present

## 2024-03-17 DIAGNOSIS — N184 Chronic kidney disease, stage 4 (severe): Secondary | ICD-10-CM | POA: Diagnosis not present

## 2024-03-17 DIAGNOSIS — I130 Hypertensive heart and chronic kidney disease with heart failure and stage 1 through stage 4 chronic kidney disease, or unspecified chronic kidney disease: Secondary | ICD-10-CM | POA: Diagnosis not present

## 2024-03-17 DIAGNOSIS — Z8249 Family history of ischemic heart disease and other diseases of the circulatory system: Secondary | ICD-10-CM | POA: Diagnosis not present

## 2024-03-17 DIAGNOSIS — I5022 Chronic systolic (congestive) heart failure: Secondary | ICD-10-CM

## 2024-03-17 DIAGNOSIS — N179 Acute kidney failure, unspecified: Secondary | ICD-10-CM | POA: Diagnosis not present

## 2024-03-17 DIAGNOSIS — Z8674 Personal history of sudden cardiac arrest: Secondary | ICD-10-CM | POA: Diagnosis not present

## 2024-03-17 DIAGNOSIS — Z7901 Long term (current) use of anticoagulants: Secondary | ICD-10-CM | POA: Diagnosis not present

## 2024-03-17 DIAGNOSIS — N400 Enlarged prostate without lower urinary tract symptoms: Secondary | ICD-10-CM | POA: Diagnosis not present

## 2024-03-17 DIAGNOSIS — Z86718 Personal history of other venous thrombosis and embolism: Secondary | ICD-10-CM | POA: Diagnosis not present

## 2024-03-17 DIAGNOSIS — Z91030 Bee allergy status: Secondary | ICD-10-CM | POA: Diagnosis not present

## 2024-03-17 DIAGNOSIS — R0789 Other chest pain: Secondary | ICD-10-CM | POA: Diagnosis not present

## 2024-03-17 DIAGNOSIS — I428 Other cardiomyopathies: Secondary | ICD-10-CM | POA: Diagnosis not present

## 2024-03-17 DIAGNOSIS — F39 Unspecified mood [affective] disorder: Secondary | ICD-10-CM | POA: Diagnosis not present

## 2024-03-17 DIAGNOSIS — Z1152 Encounter for screening for COVID-19: Secondary | ICD-10-CM | POA: Diagnosis not present

## 2024-03-17 DIAGNOSIS — E1122 Type 2 diabetes mellitus with diabetic chronic kidney disease: Secondary | ICD-10-CM | POA: Diagnosis not present

## 2024-03-17 DIAGNOSIS — Z7951 Long term (current) use of inhaled steroids: Secondary | ICD-10-CM | POA: Diagnosis not present

## 2024-03-17 DIAGNOSIS — J189 Pneumonia, unspecified organism: Secondary | ICD-10-CM | POA: Diagnosis not present

## 2024-03-17 DIAGNOSIS — K219 Gastro-esophageal reflux disease without esophagitis: Secondary | ICD-10-CM | POA: Diagnosis not present

## 2024-03-17 DIAGNOSIS — I4891 Unspecified atrial fibrillation: Secondary | ICD-10-CM | POA: Diagnosis not present

## 2024-03-17 DIAGNOSIS — Z833 Family history of diabetes mellitus: Secondary | ICD-10-CM | POA: Diagnosis not present

## 2024-03-17 LAB — ECHOCARDIOGRAM LIMITED
Area-P 1/2: 3.31 cm2
Height: 72 in
S' Lateral: 4.7 cm
Weight: 4288 [oz_av]

## 2024-03-17 LAB — GLUCOSE, CAPILLARY
Glucose-Capillary: 319 mg/dL — ABNORMAL HIGH (ref 70–99)
Glucose-Capillary: 298 mg/dL — ABNORMAL HIGH (ref 70–99)

## 2024-03-17 LAB — CBG MONITORING, ED: Glucose-Capillary: 353 mg/dL — ABNORMAL HIGH (ref 70–99)

## 2024-03-17 MED ORDER — SODIUM CHLORIDE 0.9 % IV SOLN
1.0000 g | Freq: Once | INTRAVENOUS | Status: AC
  Administered 2024-03-17: 1 g via INTRAVENOUS
  Filled 2024-03-17: qty 10

## 2024-03-17 MED ORDER — PANTOPRAZOLE SODIUM 40 MG PO TBEC
40.0000 mg | DELAYED_RELEASE_TABLET | Freq: Every day | ORAL | Status: DC
  Administered 2024-03-17 – 2024-03-24 (×8): 40 mg via ORAL
  Filled 2024-03-17 (×8): qty 1

## 2024-03-17 MED ORDER — INSULIN GLARGINE 100 UNIT/ML ~~LOC~~ SOLN
30.0000 [IU] | Freq: Every day | SUBCUTANEOUS | Status: DC
  Administered 2024-03-17 – 2024-03-18 (×2): 30 [IU] via SUBCUTANEOUS
  Filled 2024-03-17 (×3): qty 0.3

## 2024-03-17 MED ORDER — METOPROLOL SUCCINATE ER 25 MG PO TB24
12.5000 mg | ORAL_TABLET | Freq: Every day | ORAL | Status: DC
  Administered 2024-03-17 – 2024-03-24 (×8): 12.5 mg via ORAL
  Filled 2024-03-17 (×8): qty 1

## 2024-03-17 MED ORDER — PERFLUTREN LIPID MICROSPHERE
1.0000 mL | INTRAVENOUS | Status: AC | PRN
  Administered 2024-03-17: 7 mL via INTRAVENOUS

## 2024-03-17 MED ORDER — SODIUM CHLORIDE 0.9 % IV SOLN
1.0000 g | INTRAVENOUS | Status: DC
  Administered 2024-03-18 – 2024-03-20 (×3): 1 g via INTRAVENOUS
  Filled 2024-03-17 (×3): qty 10

## 2024-03-17 MED ORDER — AMIODARONE HCL 200 MG PO TABS
200.0000 mg | ORAL_TABLET | Freq: Every day | ORAL | Status: DC
  Administered 2024-03-17 – 2024-03-24 (×8): 200 mg via ORAL
  Filled 2024-03-17 (×8): qty 1

## 2024-03-17 MED ORDER — ISOSORBIDE MONONITRATE ER 30 MG PO TB24
15.0000 mg | ORAL_TABLET | Freq: Every day | ORAL | Status: DC
  Administered 2024-03-17 – 2024-03-24 (×8): 15 mg via ORAL
  Filled 2024-03-17 (×8): qty 1

## 2024-03-17 MED ORDER — SODIUM CHLORIDE 0.9 % IV SOLN
500.0000 mg | Freq: Once | INTRAVENOUS | Status: AC
  Administered 2024-03-17: 500 mg via INTRAVENOUS
  Filled 2024-03-17: qty 5

## 2024-03-17 MED ORDER — FLUTICASONE FUROATE-VILANTEROL 200-25 MCG/ACT IN AEPB
1.0000 | INHALATION_SPRAY | Freq: Every day | RESPIRATORY_TRACT | Status: DC
  Administered 2024-03-17 – 2024-03-24 (×8): 1 via RESPIRATORY_TRACT
  Filled 2024-03-17 (×2): qty 28

## 2024-03-17 MED ORDER — HYDRALAZINE HCL 25 MG PO TABS
25.0000 mg | ORAL_TABLET | Freq: Three times a day (TID) | ORAL | Status: DC
  Administered 2024-03-17 – 2024-03-18 (×3): 25 mg via ORAL
  Filled 2024-03-17 (×3): qty 1

## 2024-03-17 MED ORDER — AZITHROMYCIN 250 MG PO TABS
500.0000 mg | ORAL_TABLET | Freq: Every day | ORAL | Status: AC
  Administered 2024-03-18 – 2024-03-19 (×2): 500 mg via ORAL
  Filled 2024-03-17 (×2): qty 2

## 2024-03-17 MED ORDER — ATORVASTATIN CALCIUM 10 MG PO TABS
10.0000 mg | ORAL_TABLET | Freq: Every day | ORAL | Status: DC
Start: 2024-03-17 — End: 2024-03-24
  Administered 2024-03-17 – 2024-03-24 (×8): 10 mg via ORAL
  Filled 2024-03-17 (×8): qty 1

## 2024-03-17 MED ORDER — NYSTATIN 100000 UNIT/GM EX POWD
Freq: Two times a day (BID) | CUTANEOUS | Status: DC
  Filled 2024-03-17 (×3): qty 15

## 2024-03-17 MED ORDER — SODIUM CHLORIDE 0.9 % IV SOLN: INTRAVENOUS | Status: DC

## 2024-03-17 MED ORDER — SERTRALINE HCL 50 MG PO TABS
75.0000 mg | ORAL_TABLET | Freq: Every morning | ORAL | Status: DC
  Administered 2024-03-17 – 2024-03-24 (×8): 75 mg via ORAL
  Filled 2024-03-17 (×8): qty 2

## 2024-03-17 MED ORDER — PREDNISOLONE ACETATE 1 % OP SUSP
1.0000 [drp] | Freq: Every day | OPHTHALMIC | Status: DC
  Administered 2024-03-20 – 2024-03-23 (×3): 1 [drp] via OPHTHALMIC
  Filled 2024-03-17: qty 5

## 2024-03-17 MED ORDER — RIVAROXABAN 20 MG PO TABS
20.0000 mg | ORAL_TABLET | Freq: Every day | ORAL | Status: DC
  Administered 2024-03-17 – 2024-03-23 (×7): 20 mg via ORAL
  Filled 2024-03-17 (×7): qty 1

## 2024-03-17 MED ORDER — GABAPENTIN 300 MG PO CAPS
300.0000 mg | ORAL_CAPSULE | Freq: Every day | ORAL | Status: DC
  Administered 2024-03-17 – 2024-03-23 (×7): 300 mg via ORAL
  Filled 2024-03-17 (×7): qty 1

## 2024-03-17 MED ORDER — INSULIN ASPART 100 UNIT/ML IJ SOLN
0.0000 [IU] | Freq: Three times a day (TID) | INTRAMUSCULAR | Status: DC
  Administered 2024-03-17: 11 [IU] via SUBCUTANEOUS
  Administered 2024-03-18: 8 [IU] via SUBCUTANEOUS
  Administered 2024-03-18: 3 [IU] via SUBCUTANEOUS
  Administered 2024-03-18: 11 [IU] via SUBCUTANEOUS
  Administered 2024-03-19: 15 [IU] via SUBCUTANEOUS
  Administered 2024-03-19: 8 [IU] via SUBCUTANEOUS
  Administered 2024-03-19: 11 [IU] via SUBCUTANEOUS
  Administered 2024-03-20: 5 [IU] via SUBCUTANEOUS
  Administered 2024-03-20: 11 [IU] via SUBCUTANEOUS
  Administered 2024-03-20: 5 [IU] via SUBCUTANEOUS
  Administered 2024-03-21: 8 [IU] via SUBCUTANEOUS
  Administered 2024-03-21: 15 [IU] via SUBCUTANEOUS
  Administered 2024-03-21: 3 [IU] via SUBCUTANEOUS
  Administered 2024-03-22: 5 [IU] via SUBCUTANEOUS
  Administered 2024-03-22: 15 [IU] via SUBCUTANEOUS
  Administered 2024-03-22 – 2024-03-23 (×2): 3 [IU] via SUBCUTANEOUS
  Administered 2024-03-23: 15 [IU] via SUBCUTANEOUS
  Administered 2024-03-23: 3 [IU] via SUBCUTANEOUS
  Administered 2024-03-24: 2 [IU] via SUBCUTANEOUS
  Administered 2024-03-24: 3 [IU] via SUBCUTANEOUS

## 2024-03-17 MED ORDER — HYDROCODONE-ACETAMINOPHEN 5-325 MG PO TABS
1.0000 | ORAL_TABLET | ORAL | Status: DC | PRN
  Administered 2024-03-17: 2 via ORAL
  Administered 2024-03-17: 1 via ORAL
  Administered 2024-03-18 – 2024-03-22 (×8): 2 via ORAL
  Filled 2024-03-17 (×8): qty 2
  Filled 2024-03-17: qty 1
  Filled 2024-03-17: qty 2

## 2024-03-17 MED ORDER — TAMSULOSIN HCL 0.4 MG PO CAPS
0.8000 mg | ORAL_CAPSULE | Freq: Every day | ORAL | Status: DC
  Administered 2024-03-17 – 2024-03-24 (×8): 0.8 mg via ORAL
  Filled 2024-03-17 (×8): qty 2

## 2024-03-17 MED ORDER — INSULIN GLARGINE-YFGN 100 UNIT/ML ~~LOC~~ SOLN: 30.0000 [IU] | Freq: Every day | SUBCUTANEOUS | Status: DC

## 2024-03-17 NOTE — Progress Notes (Signed)
  Echocardiogram 2D Echocardiogram has been performed.  Koleen KANDICE Popper, RDCS 03/17/2024, 10:13 AM

## 2024-03-17 NOTE — Consult Note (Addendum)
 Cardiology Consultation   Patient ID: [name] MRN: [id number]; DOB: 1966/02/01  Admit date: 03/16/2024 Date of Consult: 03/17/2024  PCP:  Vicci Barnie NOVAK, MD   [facility] Providers Cardiologist:  Redell Shallow, MD  Electrophysiologist:  OLE ONEIDA HOLTS, MD  Advanced Heart Failure:  Ria Commander, DO      Patient Profile: [name] is a 58 y.o. male with a hx of HFrEF with recovered EF (dx 2015) w/ LHC demonstrating normal coronary anatomy s/p primary prevention ICD, OSA unable to tolerate CPAP, CKDIIIB & T2DM  who is being seen 03/17/2024 for the evaluation of chest pain at the request of ED team.  History of Present Illness: [name] presents today with both chest pain and lightheadedness.  He describes sharp achy chest pain in the left chest that is migrated to the right chest and into the right shoulder.  He tells me that his right shoulder is tender to palpation.  He describes some dizziness and fogginess.  He was seen in the ER about 3 weeks ago with ventricular tachycardia in setting of hypokalemia.  He says he felt the same way.  He describes feeling ill 2 to 3 days ago.  Reports no increased lower extremity edema and no volume overload.  He also tells me he was diagnosed with pneumonia due to an aspiration event during his recent hospitalization.  He tells me his ICD did not shock him.  Reports no fevers or chills.  Just feeling poorly over the past few days.  On arrival to [facility] vital signs are stable.  Labs notable for serum creatinine of 3.46 which is increased from baseline.  BNP 21.  Troponin 14 and 11 on repeat.  Hemoglobin stable.  EKG demonstrates sinus rhythm heart 87 with nonspecific IVCD.  CT chest without contrast demonstrates patchy airspace disease in the left upper lung concerning for possible pneumonia.     Past Medical History:  Diagnosis Date   AICD (automatic cardioverter/defibrillator) present    Chronic bronchitis  (HCC)    get it most q yr (12/23/2013)   Chronic systolic (congestive) heart failure (HCC)    Fracture of left humerus    a. 07/2013.   GERD (gastroesophageal reflux disease)    not at present time   Heart murmur    born w/it    History of renal calculi    Hypertension    Kidney stones    NICM (nonischemic cardiomyopathy) (HCC)    a. 07/2013 Echo: EF 20-25%, diff HK, Gr2 DD, mild MR, mod dil LA/RA. 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    Shockable heart rhythm detected by automated external defibrillator    Syncope    Type II diabetes mellitus (HCC) 2006    Past Surgical History:  Procedure Laterality Date   CARDIAC CATHETERIZATION  08/10/13   CHOLECYSTECTOMY  ~ 2010   IMPLANTABLE CARDIOVERTER DEFIBRILLATOR IMPLANT  12/23/2013   STJ Fortify ICD implanted by Dr Kelsie for cardiomyopathy and syncope   IMPLANTABLE CARDIOVERTER DEFIBRILLATOR IMPLANT N/A 12/23/2013   Procedure: IMPLANTABLE CARDIOVERTER DEFIBRILLATOR IMPLANT;  Surgeon: Lynwood JONETTA Kelsie, MD;  Location: [facility] CATH LAB;  Service: Cardiovascular;  Laterality: N/A;   INGUINAL HERNIA REPAIR Left 03/01/2018   Procedure: OPEN REPAIR OF LEFT INGUINAL HERNIA WITH MESH;  Surgeon: Tanda Locus, MD;  Location: [facility] ORS;  Service: General;  Laterality: Left;   LEAD REVISION/REPAIR N/A  05/15/2022   Procedure: LEAD REVISION/REPAIR;  Surgeon: [name] Ole DASEN, MD;  Location: [facility] INVASIVE CV LAB;  Service: Cardiovascular;  Laterality: N/A;   LEFT HEART CATHETERIZATION WITH CORONARY ANGIOGRAM N/A 08/10/2013   Procedure: LEFT HEART CATHETERIZATION WITH CORONARY ANGIOGRAM;  Surgeon: Candyce GORMAN Reek, MD;  Location: [facility] CATH LAB;  Service: Cardiovascular;  Laterality: N/A;   ORIF HUMERUS FRACTURE Left 08/12/2013   Procedure: OPEN REDUCTION INTERNAL FIXATION (ORIF) HUMERAL SHAFT FRACTURE;  Surgeon: Jerona LULLA Sage, MD;  Location: [facility] OR;  Service: Orthopedics;  Laterality: Left;   Open Reduction Internal Fixation Left Humerus   RIGHT/LEFT HEART CATH AND CORONARY ANGIOGRAPHY N/A 05/12/2022   Procedure: RIGHT/LEFT HEART CATH AND CORONARY ANGIOGRAPHY;  Surgeon: Gardenia Led, DO;  Location: [facility] INVASIVE CV LAB;  Service: Cardiovascular;  Laterality: N/A;   URETEROSCOPY     laser for kidney stones     Home Medications:  Prior to Admission medications   Medication Sig Start Date End Date Taking? Authorizing Provider  acetaminophen -codeine  (TYLENOL  #3) 300-30 MG tablet Take 1 tablet by mouth every 8 (eight) hours as needed for moderate pain (pain score 4-6). 03/09/24  Yes [provider]  amiodarone  (PACERONE ) 200 MG tablet Take 2 tablets (400 mg) twice daily for 2 weeks. Then on October 5, decrease to 1 tablet (200 mg) once daily. 03/01/24  Yes Sigdel, Santosh, MD  atorvastatin  (LIPITOR) 10 MG tablet Take 1 tablet (10 mg total) by mouth daily. 12/14/23  Yes Vicci Barnie NOVAK, MD  benzonatate  (TESSALON ) 200 MG capsule Take 200 mg by mouth 2 (two) times daily as needed for cough. 11/01/23  Yes [provider]  cetirizine  (EQ ALLERGY RELIEF, CETIRIZINE ,) 10 MG tablet Take 1 tablet (10 mg total) by mouth daily. 09/30/23  Yes Vicci Barnie NOVAK, MD  cholecalciferol  (VITAMIN D3) 25 MCG (1000 UNIT) tablet Take 1,000 Units by mouth in the morning.   Yes [provider]  clonazePAM  (KLONOPIN ) 0.5 MG tablet Take 1 tablet (0.5 mg total) by mouth 2 (two) times daily as needed for anxiety. Patient taking differently: Take 0.5 mg by mouth 2 (two) times daily. 02/18/24 02/17/25 Yes Carvin Arvella HERO, MD  Clotrimazole  1 % OINT Apply 1 Application topically 2 (two) times daily as needed (irritation). 02/28/24  Yes Lelon Hamilton T, PA-C  colchicine  0.6 MG tablet Take 0.5 tablets (0.3 mg total) by mouth every Monday, Wednesday, and Friday. 02/29/24  Yes Weaver, Scott T, PA-C  EPINEPHrine  0.3 mg/0.3 mL IJ SOAJ injection Inject 0.3 mg into the muscle as needed. 01/30/22  Yes  Iva Marty Saltness, MD  fluticasone  (FLONASE ) 50 MCG/ACT nasal spray Place 2 sprays into both nostrils daily. Patient taking differently: Place 2 sprays into both nostrils daily as needed for allergies. 11/16/23  Yes Hedges, Reyes, PA-C  gabapentin  (NEURONTIN ) 300 MG capsule Take 1 capsule (300 mg total) by mouth at bedtime. 11/25/23  Yes Vicci Barnie NOVAK, MD  HYDROcodone -acetaminophen  (NORCO/VICODIN) 5-325 MG tablet Take 1-2 tablets by mouth every 4 (four) hours as needed for moderate pain (pain score 4-6) or severe pain (pain score 7-10). 03/01/24  Yes Sigdel, Santosh, MD  insulin  aspart (NOVOLOG  FLEXPEN) 100 UNIT/ML FlexPen Inject 22 Units into the skin 2 (two) times daily. 02/28/24  Yes Weaver, Scott T, PA-C  insulin  glargine (LANTUS  SOLOSTAR) 100 UNIT/ML Solostar Pen Inject 30 Units into the skin at bedtime. 02/28/24  Yes Weaver, Scott T, PA-C  magnesium  oxide (MAG-OX) 400 MG tablet Take 1 tablet (400 mg total) by mouth daily.  03/01/24  Yes Sigdel, Santosh, MD  metolazone  (ZAROXOLYN ) 5 MG tablet Take 1 tablet (5 mg total) by mouth once a week. 11/25/23 03/10/25 Yes Vicci Barnie NOVAK, MD  metoprolol  succinate (TOPROL -XL) 25 MG 24 hr tablet Take 0.5 tablets (12.5 mg total) by mouth daily. 12/14/23  Yes Vicci Barnie NOVAK, MD  nystatin  (MYCOSTATIN /NYSTOP ) powder Apply 1 Application topically 3 (three) times daily. 11/25/23  Yes Vicci Barnie NOVAK, MD  pantoprazole  (PROTONIX ) 40 MG tablet Take 1 tablet (40 mg total) by mouth daily. 03/07/24  Yes Vicci Barnie NOVAK, MD  potassium chloride  SA (KLOR-CON  M) 20 MEQ tablet Take 2 tablets (40 mEq total) by mouth 2 (two) times daily. Take an extra 20 mEq when you take Metolazone . 03/01/24  Yes Sigdel, Santosh, MD  prednisoLONE acetate (PRED FORTE) 1 % ophthalmic suspension Place 1 drop into the left eye at bedtime. 02/09/24  Yes [provider]  rivaroxaban  (XARELTO ) 20 MG TABS tablet Take 1 tablet (20 mg total) by mouth daily with supper. 02/05/24  Yes  Vicci Barnie NOVAK, MD  sertraline  (ZOLOFT ) 50 MG tablet Take 1.5 tablets (75 mg total) by mouth in the morning. 11/19/23 11/18/24 Yes Carvin Arvella HERO, MD  SYMBICORT  160-4.5 MCG/ACT inhaler Inhale 2 puffs into the lungs in the morning and at bedtime. 08/28/23  Yes [provider]  tamsulosin  (FLOMAX ) 0.4 MG CAPS capsule Take 2 capsules (0.8 mg total) by mouth daily. 11/25/23  Yes Vicci Barnie NOVAK, MD  torsemide  (DEMADEX ) 20 MG tablet Take 2 tablets (40 mg total) by mouth daily. 11/04/23  Yes Milford, Harlene HERO, FNP  Continuous Glucose Sensor (FREESTYLE LIBRE 2 PLUS SENSOR) MISC Change sensor every 15 days. 02/17/24   Vicci Barnie NOVAK, MD  Insulin  Pen Needle (TECHLITE PEN NEEDLES) 32G X 4 MM MISC Use to inject insulin  into the skin 3 times per day. 10/13/23   Regalado, Belkys A, MD  latanoprost (XALATAN) 0.005 % ophthalmic solution Place 1 drop into both eyes at bedtime. Patient not taking: Reported on 03/17/2024 02/16/24   [provider]    Scheduled Meds:  acetaminophen   1,000 mg Oral Once   lidocaine   1 patch Transdermal Q24H   Continuous Infusions:  azithromycin  (ZITHROMAX ) 500 mg in sodium chloride  0.9 % 250 mL IVPB 500 mg (03/17/24 0234)   PRN Meds:   Allergies:    Allergies  Allergen Reactions   Bee Venom Anaphylaxis   Trulicity  [Dulaglutide ] Anaphylaxis and Diarrhea    Extra dose of Trulicity  caused cardiac arrest. Patient experience onset of diarrhea with using this medication - cleared after med stopped.    Social History:   Social History   Socioeconomic History   Marital status: Single    Spouse name: Not on file   Number of children: Not on file   Years of education: Not on file   Highest education level: Not on file  Occupational History   Occupation: Disabled  Tobacco Use   Smoking status: Former    Current packs/day: 0.00    Average packs/day: 1 pack/day for 28.0 years (28.0 ttl pk-yrs)    Types: Cigarettes    Start date: 06/03/1986    Quit  date: 06/03/2014    Years since quitting: 9.7   Smokeless tobacco: Never  Vaping Use   Vaping status: Never Used  Substance and Sexual Activity   Alcohol use: Not Currently   Drug use: Yes    Types: Marijuana    Comment: occasionally;   Sexual activity: Yes  Other  Topics Concern   Not on file  Social History Narrative   Pt lives with his brother    Social Drivers of Health   Financial Resource Strain: Low Risk  (05/25/2023)   Overall Financial Resource Strain (CARDIA)    Difficulty of Paying Living Expenses: Not hard at all  Recent Concern: Financial Resource Strain - High Risk (03/24/2023)   Overall Financial Resource Strain (CARDIA)    Difficulty of Paying Living Expenses: Hard  Food Insecurity: No Food Insecurity (03/02/2024)   Hunger Vital Sign    Worried About Running Out of Food in the Last Year: Never true    Ran Out of Food in the Last Year: Never true  Transportation Needs: No Transportation Needs (03/02/2024)   PRAPARE - Administrator, Civil Service (Medical): No    Lack of Transportation (Non-Medical): No  Physical Activity: Inactive (05/25/2023)   Exercise Vital Sign    Days of Exercise per Week: 0 days    Minutes of Exercise per Session: 0 min  Stress: Stress Concern Present (05/25/2023)   [employer] of Occupational Health - Occupational Stress Questionnaire    Feeling of Stress : Rather much  Social Connections: Moderately Integrated (10/08/2023)   Social Connection and Isolation Panel    Frequency of Communication with Friends and Family: More than three times a week    Frequency of Social Gatherings with Friends and Family: More than three times a week    Attends Religious Services: More than 4 times per year    Active Member of [employer] or Organizations: Yes    Attends Banker Meetings: 1 to 4 times per year    Marital Status: Never married  Intimate Partner Violence: Not At Risk (03/02/2024)   Humiliation, Afraid, Rape, and  Kick questionnaire    Fear of Current or Ex-Partner: No    Emotionally Abused: No    Physically Abused: No    Sexually Abused: No    Family History:   Family History  Problem Relation Age of Onset   Diabetes Father    Arthritis Father    Hypertension Father    Diabetes Mother    Arthritis Mother    Hypertension Mother    Hypertension Brother      ROS:  Please see the history of present illness.  All other ROS reviewed and negative.     Physical Exam/Data: Vitals:   03/16/24 1530 03/16/24 1847 03/16/24 2300 03/17/24 0234  BP:  136/78 115/85 127/82  Pulse:  85 76 74  Resp:  19 20 (!) 25  Temp:  98.4 F (36.9 C)  98.7 F (37.1 C)  TempSrc:  Oral  Oral  SpO2: 94% 95% 96% 94%  Weight:      Height:       No intake or output data in the 24 hours ending 03/17/24 0315    03/16/2024    3:22 PM 03/10/2024    2:19 PM 03/01/2024    6:20 AM  Last 3 Weights  Weight (lbs) 268 lb 276 lb 279 lb 1.6 oz  Weight (kg) 121.564 kg 125.193 kg 126.6 kg     Body mass index is 36.35 kg/m.  General:  Well nourished, well developed, in no acute distress HEENT: normal Neck: no JVD but hard to appreciate Vascular: No carotid bruits; Distal pulses 2+ bilaterally Cardiac:  normal S1, S2; RRR; no murmur  Lungs:  clear to auscultation bilaterally, no wheezing, rhonchi or rales  Abd: soft, nontender, no  hepatomegaly  Ext: no edema Musculoskeletal:  No deformities, BUE and BLE strength normal and equal Skin: warm and dry  Neuro:  CNs 2-12 intact, no focal abnormalities noted Psych:  Normal affect   EKG:  The EKG was personally reviewed and demonstrates:  SR, IVCD, no concerning STT changes  Telemetry:  Telemetry was personally reviewed and demonstrates:  SR, occasional PVC  Relevant CV Studies: TTE Limited (02/25/2024)  1. Only off axis foreshortened apical views available unable to fully  evaluate RWMA;s. Left ventricular ejection fraction, by estimation, is 45  to 50%. The left ventricle  has mildly decreased function. The left  ventricular internal cavity size was  mildly dilated. There is severe left ventricular hypertrophy. Left  ventricular diastolic parameters are consistent with Grade I diastolic  dysfunction (impaired relaxation).   2. Right ventricular systolic function is normal. The right ventricular  size is normal.   3. Left atrial size was mildly dilated.   4. The mitral valve is abnormal. Trivial mitral valve regurgitation.   5. The inferior vena cava is dilated in size with >50% respiratory  variability, suggesting right atrial pressure of 8 mmHg.   LHC/RHC 05/13/2022 IMPRESSION: Low pre and post capillary filling pressures.  Normal cardiac output/cardiac index.  Normal PVR & PA mean No obstructive CAD, left dominant w/ large Lcx supplying multiple large marginals.   R/LHC (05/12/2022) HEMODYNAMICS: RA:                  1 mmHg (mean) RV:                  22/1-3 mmHg PA:                  23/6 mmHg (12 mean) PCWP:            4 mmHg (mean)                                      Estimated Fick CO/CI   7 L/min, 2.9 L/min/m2                                                 TPG                 8  mmHg                                              PVR                 ~1 Wood Units  PAPi                >5     IMPRESSION: Low pre and post capillary filling pressures.  Normal cardiac output/cardiac index.  Normal PVR & PA mean No obstructive CAD, left dominant w/ large Lcx supplying multiple large marginals.   Laboratory Data: High Sensitivity Troponin:   Recent Labs  Lab 02/25/24 1144 02/25/24 1329 03/16/24 1623 03/16/24 1908  TROPONINIHS 24* 23* 14 11     Chemistry Recent Labs  Lab 03/10/24 1506 03/16/24 1623  NA 140 139  K 3.8 3.7  CL 99 90*  CO2 23 30  GLUCOSE 214* 225*  BUN 25* 39*  CREATININE 3.09* 3.46*  CALCIUM  9.4 9.6  MG  --  1.7  GFRNONAA 22* 20*  ANIONGAP 18* 19*    Recent Labs  Lab 03/16/24 1623  PROT 8.2*  ALBUMIN  3.5   AST 17  ALT 20  ALKPHOS 66  BILITOT 1.6*    Hematology Recent Labs  Lab 03/16/24 1623  WBC 9.7  RBC 4.32  HGB 12.9*  HCT 39.2  MCV 90.7  MCH 29.9  MCHC 32.9  RDW 13.9  PLT 282   BNP Recent Labs  Lab 03/16/24 1623  BNP 21.2     Radiology/Studies:  CT Chest Wo Contrast Result Date: 03/16/2024 CLINICAL DATA:  Right upper chest wall pain. No contrast due to low GFR. EXAM: CT CHEST WITHOUT CONTRAST TECHNIQUE: Multidetector CT imaging of the chest was performed following the standard protocol without IV contrast. RADIATION DOSE REDUCTION: This exam was performed according to the departmental dose-optimization program which includes automated exposure control, adjustment of the mA and/or kV according to patient size and/or use of iterative reconstruction technique. COMPARISON:  Chest radiograph 03/16/2024. Radionuclide perfusion scan 12/27/2023. CT chest 12/27/2023 FINDINGS: Cardiovascular: Cardiac pacemaker. Cardiac enlargement. No pericardial effusions. Normal caliber thoracic aorta. No significant vascular calcifications. Mediastinum/Nodes: Thyroid  gland is unremarkable. No significant lymphadenopathy in the chest. Esophagus is decompressed. Lungs/Pleura: Mild emphysematous changes in the lungs. Patchy airspace infiltrates in the left upper lung with linear infiltration or atelectasis in the left greater than right lung base. This is progressing since the prior study and may represent developing pneumonia superimposed upon chronic fibrosis and atelectasis. No pleural effusion or pneumothorax. Upper Abdomen: No acute abnormality. Bilateral adrenal gland nodules, 1.9 cm on the right and 2.4 cm on the left. No change in size since prior studies dating back to 09/17/2017. Long-term stability is consistent with benign etiology and no imaging follow-up is indicated. Musculoskeletal: Degenerative changes in the spine. No acute bony abnormalities. IMPRESSION: 1. Cardiac enlargement. 2. Linear  infiltration or atelectasis in the lung bases, greater on the left, similar to prior study. Developing patchy airspace infiltrates in the left upper lung. This likely represents developing pneumonia superimposed upon chronic fibrosis and atelectasis. 3. Bilateral adrenal gland nodules demonstrate long-term stability, likely benign. No imaging follow-up is indicated. Electronically Signed   By: Elsie Gravely M.D.   On: 03/16/2024 20:29   DG Chest 2 View Result Date: 03/16/2024 CLINICAL DATA:  Chest pain. EXAM: CHEST - 2 VIEW COMPARISON:  02/25/2024 FINDINGS: Stable appearance of the left chest ICD. Cardiac silhouette appears to be enlarged but stable. Prominent central vascular markings without overt pulmonary edema. No large pleural effusions. IMPRESSION: Cardiomegaly with prominent central vascular markings. Findings could represent vascular congestion. No overt pulmonary edema. Electronically Signed   By: Juliene Balder M.D.   On: 03/16/2024 16:28   Assessment and Plan:  # Chest pain # MSK Pain - Describes noncardiac chest discomfort.  Also here with possible pneumonia.  He is exquisitely tender in the right shoulder area.  Would recommend Tylenol  and other pain management per hospital medicine.  This is not an acute coronary syndrome.  Left heart cath is reassuring from 2023. - We have ordered an echo.  Would follow this up.  As long as there is no change nothing else needs to be done.  # Chronic systolic heart failure with recovered ejection fraction, EF 45-50% - Euvolemic on exam.  Now with AKI and possible pneumonia.  He does not appear to be in decompensated heart failure. - Hold diuretics.  Can restart once AKI has resolved. - Continue metoprolol  succinate 12.5 mg daily.  I have added Imdur 15 mg daily and hydralazine  25 mg 3 times daily.  He should remain off any ACE/ARB/ARNI/MRA given CKD and AKI.  # History of VT # ICD - No recurrence of arrhythmia.  ICD has not shocked him. - Continue  home amiodarone  200 mg daily.  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 Per hospital team.   [facility] will sign off.   Medication Recommendations:  as above Other recommendations (labs, testing, etc):  none Follow up as an outpatient:  keep scheduled outpatient follow-up.   Signed, Darryle DASEN. Barbaraann, MD [place]  [facility]  03/17/2024 10:25 AM    For questions or updates, please contact [facility] Please consult [email or web address] for contact info under

## 2024-03-17 NOTE — Hospital Course (Signed)
  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  Patient has hospital admission 3 weeks ago for pneumonia.  At presentation to ED patient is hemodynamically stable. Normal troponin x 2.  Normal mag level.  Normal BNP.  CMP showing creatinine 3.46, elevated bilirubin 1.6 elevated anion gap 19.  Renal function at baseline.  CBC unremarkable.  CT chest without contrast showing concern for pneumonia upon chronic fibrosis and atelectasis.  In the ED patient received azithromycin  and ceftriaxone . EDP also consulted given patient has significant cardiac history and interrogation for ICD.  Hospitalist consulted for further evaluation management of pneumonia atypical chest pain.

## 2024-03-17 NOTE — H&P (Addendum)
 History and Physical    Patient: [name] FMW:[id number] DOB: [birth year] DOA: 03/16/2024 DOS: the patient was seen and examined on 03/17/2024 PCP: Vicci Barnie NOVAK, MD  Patient coming from: Home  Chief Complaint:  Chief Complaint  Patient presents with   Chest Pain   HPI: [name] is a 58 y.o. male with medical history significant of Afib, HFrEF (dx 2015) w/ LHC demonstrating normal coronary anatomy s/p primary prevention ICD, OSA unable to tolerate CPAP, CKDIIIB & T2DM, and recent admission 3 weeks ago for PMVT iso hypokalemia p/w recurrent cp and found to have LUL CAP.   The patient presented with chest pain located centrally in the chest. The patient reported that a similar episode occurred two weeks ago after consuming drinks that went down the wrong way, which led to an infection. The patient experienced a recurrence of the chest pain the other day. It occurred twice in two weeks, which the patient considered relatively frequent. The patient had not experienced this problem prior to two weeks ago. The patient took Advil at home for pain relief, despite being advised against it due to kidney concerns. The pain was so severe that the patient could barely walk and sought medical attention via EMS.  In the ED, pt tachypneic on RA. Labs notable for Cr 3.46 (baseline 2.3-2.4, but as high as 3.09 on 10/2). CT chest showed developing patchy airspace infiltrates in the left upper lung. This likely represents developing pneumonia superimposed upon chronic fibrosis and atelectasis. EDP started CAP coverage abx (IV CTX and azithromycin ), consulted cards and requested medicine admission.   Review of Systems: As mentioned in the history of present illness. All other systems reviewed and are negative. Past Medical History:  Diagnosis Date   AICD (automatic cardioverter/defibrillator) present    Chronic bronchitis (HCC)    get it most q yr (12/23/2013)   Chronic systolic (congestive)  heart failure (HCC)    Fracture of left humerus    a. 07/2013.   GERD (gastroesophageal reflux disease)    not at present time   Heart murmur    born w/it    History of renal calculi    Hypertension    Kidney stones    NICM (nonischemic cardiomyopathy) (HCC)    a. 07/2013 Echo: EF 20-25%, diff HK, Gr2 DD, mild MR, mod dil LA/RA. 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    Shockable heart rhythm detected by automated external defibrillator    Syncope    Type II diabetes mellitus (HCC) 2006   Past Surgical History:  Procedure Laterality Date   CARDIAC CATHETERIZATION  08/10/13   CHOLECYSTECTOMY  ~ 2010   IMPLANTABLE CARDIOVERTER DEFIBRILLATOR IMPLANT  12/23/2013   STJ Fortify ICD implanted by Dr Kelsie for cardiomyopathy and syncope   IMPLANTABLE CARDIOVERTER DEFIBRILLATOR IMPLANT N/A 12/23/2013   Procedure: IMPLANTABLE CARDIOVERTER DEFIBRILLATOR IMPLANT;  Surgeon: Lynwood JONETTA Kelsie, MD;  Location: [facility] CATH LAB;  Service: Cardiovascular;  Laterality: N/A;   INGUINAL HERNIA REPAIR Left 03/01/2018   Procedure: OPEN REPAIR OF LEFT INGUINAL HERNIA WITH MESH;  Surgeon: Tanda Locus, MD;  Location: [facility] ORS;  Service: General;  Laterality: Left;   LEAD REVISION/REPAIR N/A 05/15/2022   Procedure: LEAD REVISION/REPAIR;  Surgeon: [name] Ole DASEN, MD;  Location: [facility] INVASIVE CV LAB;  Service: Cardiovascular;  Laterality: N/A;   LEFT HEART CATHETERIZATION WITH CORONARY ANGIOGRAM N/A 08/10/2013   Procedure: LEFT HEART  CATHETERIZATION WITH CORONARY ANGIOGRAM;  Surgeon: Candyce GORMAN Reek, MD;  Location: [facility] CATH LAB;  Service: Cardiovascular;  Laterality: N/A;   ORIF HUMERUS FRACTURE Left 08/12/2013   Procedure: OPEN REDUCTION INTERNAL FIXATION (ORIF) HUMERAL SHAFT FRACTURE;  Surgeon: Jerona LULLA Sage, MD;  Location: [facility] OR;  Service: Orthopedics;  Laterality: Left;  Open Reduction Internal Fixation Left Humerus   RIGHT/LEFT HEART CATH AND  CORONARY ANGIOGRAPHY N/A 05/12/2022   Procedure: RIGHT/LEFT HEART CATH AND CORONARY ANGIOGRAPHY;  Surgeon: Gardenia Led, DO;  Location: [facility] INVASIVE CV LAB;  Service: Cardiovascular;  Laterality: N/A;   URETEROSCOPY     laser for kidney stones   Social History:  reports that he quit smoking about 9 years ago. His smoking use included cigarettes. He started smoking about 37 years ago. He has a 28 pack-year smoking history. He has never used smokeless tobacco. He reports that he does not currently use alcohol. He reports current drug use. Drug: Marijuana.  Allergies  Allergen Reactions   Bee Venom Anaphylaxis   Trulicity  [Dulaglutide ] Anaphylaxis and Diarrhea    Extra dose of Trulicity  caused cardiac arrest. Patient experience onset of diarrhea with using this medication - cleared after med stopped.    Family History  Problem Relation Age of Onset   Diabetes Father    Arthritis Father    Hypertension Father    Diabetes Mother    Arthritis Mother    Hypertension Mother    Hypertension Brother     Prior to Admission medications   Medication Sig Start Date End Date Taking? Authorizing Provider  acetaminophen -codeine  (TYLENOL  #3) 300-30 MG tablet Take 1 tablet by mouth every 8 (eight) hours as needed for moderate pain (pain score 4-6). 03/09/24  Yes [provider]  amiodarone  (PACERONE ) 200 MG tablet Take 2 tablets (400 mg) twice daily for 2 weeks. Then on October 5, decrease to 1 tablet (200 mg) once daily. 03/01/24  Yes Sigdel, Santosh, MD  atorvastatin  (LIPITOR) 10 MG tablet Take 1 tablet (10 mg total) by mouth daily. 12/14/23  Yes Vicci Barnie NOVAK, MD  benzonatate  (TESSALON ) 200 MG capsule Take 200 mg by mouth 2 (two) times daily as needed for cough. 11/01/23  Yes [provider]  cetirizine  (EQ ALLERGY RELIEF, CETIRIZINE ,) 10 MG tablet Take 1 tablet (10 mg total) by mouth daily. 09/30/23  Yes Vicci Barnie NOVAK, MD  cholecalciferol  (VITAMIN D3) 25 MCG (1000 UNIT)  tablet Take 1,000 Units by mouth in the morning.   Yes [provider]  clonazePAM  (KLONOPIN ) 0.5 MG tablet Take 1 tablet (0.5 mg total) by mouth 2 (two) times daily as needed for anxiety. Patient taking differently: Take 0.5 mg by mouth 2 (two) times daily. 02/18/24 02/17/25 Yes Carvin Arvella HERO, MD  Clotrimazole  1 % OINT Apply 1 Application topically 2 (two) times daily as needed (irritation). 02/28/24  Yes Lelon Hamilton T, PA-C  colchicine  0.6 MG tablet Take 0.5 tablets (0.3 mg total) by mouth every Monday, Wednesday, and Friday. 02/29/24  Yes Weaver, Scott T, PA-C  EPINEPHrine  0.3 mg/0.3 mL IJ SOAJ injection Inject 0.3 mg into the muscle as needed. 01/30/22  Yes Iva Marty Saltness, MD  fluticasone  (FLONASE ) 50 MCG/ACT nasal spray Place 2 sprays into both nostrils daily. Patient taking differently: Place 2 sprays into both nostrils daily as needed for allergies. 11/16/23  Yes Hedges, Reyes, PA-C  gabapentin  (NEURONTIN ) 300 MG capsule Take 1 capsule (300 mg total) by mouth at bedtime. 11/25/23  Yes Vicci Barnie NOVAK, MD  HYDROcodone -acetaminophen  (NORCO/VICODIN) 5-325 MG tablet Take 1-2 tablets by mouth every 4 (four) hours as needed for moderate pain (pain score 4-6) or severe pain (pain score 7-10). 03/01/24  Yes Sigdel, Santosh, MD  insulin  aspart (NOVOLOG  FLEXPEN) 100 UNIT/ML FlexPen Inject 22 Units into the skin 2 (two) times daily. 02/28/24  Yes Weaver, Scott T, PA-C  insulin  glargine (LANTUS  SOLOSTAR) 100 UNIT/ML Solostar Pen Inject 30 Units into the skin at bedtime. 02/28/24  Yes Weaver, Scott T, PA-C  magnesium  oxide (MAG-OX) 400 MG tablet Take 1 tablet (400 mg total) by mouth daily. 03/01/24  Yes Sigdel, Santosh, MD  metolazone  (ZAROXOLYN ) 5 MG tablet Take 1 tablet (5 mg total) by mouth once a week. 11/25/23 03/10/25 Yes Vicci Barnie NOVAK, MD  metoprolol  succinate (TOPROL -XL) 25 MG 24 hr tablet Take 0.5 tablets (12.5 mg total) by mouth daily. 12/14/23  Yes Vicci Barnie NOVAK, MD  nystatin   (MYCOSTATIN /NYSTOP ) powder Apply 1 Application topically 3 (three) times daily. 11/25/23  Yes Vicci Barnie NOVAK, MD  pantoprazole  (PROTONIX ) 40 MG tablet Take 1 tablet (40 mg total) by mouth daily. 03/07/24  Yes Vicci Barnie NOVAK, MD  potassium chloride  SA (KLOR-CON  M) 20 MEQ tablet Take 2 tablets (40 mEq total) by mouth 2 (two) times daily. Take an extra 20 mEq when you take Metolazone . 03/01/24  Yes Sigdel, Santosh, MD  prednisoLONE acetate (PRED FORTE) 1 % ophthalmic suspension Place 1 drop into the left eye at bedtime. 02/09/24  Yes [provider]  rivaroxaban  (XARELTO ) 20 MG TABS tablet Take 1 tablet (20 mg total) by mouth daily with supper. 02/05/24  Yes Vicci Barnie NOVAK, MD  sertraline  (ZOLOFT ) 50 MG tablet Take 1.5 tablets (75 mg total) by mouth in the morning. 11/19/23 11/18/24 Yes Carvin Arvella HERO, MD  SYMBICORT  160-4.5 MCG/ACT inhaler Inhale 2 puffs into the lungs in the morning and at bedtime. 08/28/23  Yes [provider]  tamsulosin  (FLOMAX ) 0.4 MG CAPS capsule Take 2 capsules (0.8 mg total) by mouth daily. 11/25/23  Yes Vicci Barnie NOVAK, MD  torsemide  (DEMADEX ) 20 MG tablet Take 2 tablets (40 mg total) by mouth daily. 11/04/23  Yes Milford, Harlene HERO, FNP  Continuous Glucose Sensor (FREESTYLE LIBRE 2 PLUS SENSOR) MISC Change sensor every 15 days. 02/17/24   Vicci Barnie NOVAK, MD  Insulin  Pen Needle (TECHLITE PEN NEEDLES) 32G X 4 MM MISC Use to inject insulin  into the skin 3 times per day. 10/13/23   Regalado, Belkys A, MD  latanoprost (XALATAN) 0.005 % ophthalmic solution Place 1 drop into both eyes at bedtime. Patient not taking: Reported on 03/17/2024 02/16/24   [provider]    Physical Exam: Vitals:   03/17/24 0500 03/17/24 0600 03/17/24 0809 03/17/24 0812  BP: 126/78 121/78    Pulse: 77 79  74  Resp: 20 20  15   Temp:    98 F (36.7 C)  TempSrc:    Oral  SpO2: 94% 93% 96% 97%  Weight:      Height:       General: Alert, oriented x3, resting comfortably  in no acute distress HEENT: EOMI, oropharynx clear, moist mucous membranes, hearing intact Neck: Trachea midline and no gross thyromegaly Respiratory: Lungs clear to auscultation bilaterally with normal respiratory effort; no w/r/r Cardiovascular: Regular rate and rhythm w/o m/r/g Abdomen: Soft, nontender, nondistended. Positive bowel sounds MSK: No obvious joint deformities or swelling Skin: No obvious rashes or lesions Neurologic: Awake, alert, spontaneously moves all extremities, strength intact Psychiatric: Appropriate mood and  affect, conversational and cooperative  Data Reviewed:  Lab Results  Component Value Date   WBC 9.7 03/16/2024   HGB 12.9 (L) 03/16/2024   HCT 39.2 03/16/2024   MCV 90.7 03/16/2024   PLT 282 03/16/2024   Lab Results  Component Value Date   GLUCOSE 225 (H) 03/16/2024   CALCIUM  9.6 03/16/2024   NA 139 03/16/2024   K 3.7 03/16/2024   CO2 30 03/16/2024   CL 90 (L) 03/16/2024   BUN 39 (H) 03/16/2024   CREATININE 3.46 (H) 03/16/2024   Lab Results  Component Value Date   ALT 20 03/16/2024   AST 17 03/16/2024   ALKPHOS 66 03/16/2024   BILITOT 1.6 (H) 03/16/2024   Lab Results  Component Value Date   INR 1.2 01/04/2024   INR 1.1 04/04/2023   INR 1.2 (H) 08/08/2013   Radiology: CT Chest Wo Contrast Result Date: 03/16/2024 CLINICAL DATA:  Right upper chest wall pain. No contrast due to low GFR. EXAM: CT CHEST WITHOUT CONTRAST TECHNIQUE: Multidetector CT imaging of the chest was performed following the standard protocol without IV contrast. RADIATION DOSE REDUCTION: This exam was performed according to the departmental dose-optimization program which includes automated exposure control, adjustment of the mA and/or kV according to patient size and/or use of iterative reconstruction technique. COMPARISON:  Chest radiograph 03/16/2024. Radionuclide perfusion scan 12/27/2023. CT chest 12/27/2023 FINDINGS: Cardiovascular: Cardiac pacemaker. Cardiac  enlargement. No pericardial effusions. Normal caliber thoracic aorta. No significant vascular calcifications. Mediastinum/Nodes: Thyroid  gland is unremarkable. No significant lymphadenopathy in the chest. Esophagus is decompressed. Lungs/Pleura: Mild emphysematous changes in the lungs. Patchy airspace infiltrates in the left upper lung with linear infiltration or atelectasis in the left greater than right lung base. This is progressing since the prior study and may represent developing pneumonia superimposed upon chronic fibrosis and atelectasis. No pleural effusion or pneumothorax. Upper Abdomen: No acute abnormality. Bilateral adrenal gland nodules, 1.9 cm on the right and 2.4 cm on the left. No change in size since prior studies dating back to 09/17/2017. Long-term stability is consistent with benign etiology and no imaging follow-up is indicated. Musculoskeletal: Degenerative changes in the spine. No acute bony abnormalities. IMPRESSION: 1. Cardiac enlargement. 2. Linear infiltration or atelectasis in the lung bases, greater on the left, similar to prior study. Developing patchy airspace infiltrates in the left upper lung. This likely represents developing pneumonia superimposed upon chronic fibrosis and atelectasis. 3. Bilateral adrenal gland nodules demonstrate long-term stability, likely benign. No imaging follow-up is indicated. Electronically Signed   By: Elsie Gravely M.D.   On: 03/16/2024 20:29   DG Chest 2 View Result Date: 03/16/2024 CLINICAL DATA:  Chest pain. EXAM: CHEST - 2 VIEW COMPARISON:  02/25/2024 FINDINGS: Stable appearance of the left chest ICD. Cardiac silhouette appears to be enlarged but stable. Prominent central vascular markings without overt pulmonary edema. No large pleural effusions. IMPRESSION: Cardiomegaly with prominent central vascular markings. Findings could represent vascular congestion. No overt pulmonary edema. Electronically Signed   By: Juliene Balder M.D.   On: 03/16/2024  16:28     Assessment and Plan: 40M h/o HFrEF (dx 2015) w/ LHC demonstrating normal coronary anatomy s/p primary prevention ICD, OSA unable to tolerate CPAP, CKDIIIB & T2DM, and recent admission 3 weeks ago for PMVT iso hypokalemia p/w recurrent cp and found to have LUL CAP c/b AKI.   AKI on CKD4 -MIVF: NS at 50cc/h for 24h -HOLD pta torsemide  for now given AKI; resume prn (will likely need dose on  tomorrow) -Strict I&Os and daily weights (standing preferred) -F/u BMP daily -Renally dose medications for CrCl -Avoid lovenox , NSAIDs, morphine , Fleet's phosphate enema, regular insulin , contrast; no gadolinium for MRI to avoid nephrogenic systemic fibrosis -Consider renal US  and nephrology consult if worsening AKI  LUL CAP -IV CTX 1g daily to complete 5 day CAP course -PO azithromycin  500mg  daily to complete 3 day CAP course -Duonebs prn -Wean O2 as tolerated -Ambulatory pulse ox prior to d/c  H/o HFrEF Chest pain -Cards consulted; apprec eval/recs -PTA amiodarone , atorvastatin , and metoprolol  -HOLD pta torsemide  for now given AKI  H/o Afib H/o cardiac arrest s/ ICD -PTA metoprolol , amiodarone  and Xarelto   DM2 -PTA Semglee  30U nightly -SSI TID AC prn  BPH -PTA Flomax  0.4mg  daily  Mood disorder -PTA sertraline     Advance Care Planning:   Code Status: Full Code   Consults: Cards  Family Communication: N/A  Severity of Illness: The appropriate patient status for this patient is INPATIENT. Inpatient status is judged to be reasonable and necessary in order to provide the required intensity of service to ensure the patient's safety. The patient's presenting symptoms, physical exam findings, and initial radiographic and laboratory data in the context of their chronic comorbidities is felt to place them at high risk for further clinical deterioration. Furthermore, it is not anticipated that the patient will be medically stable for discharge from the hospital within 2 midnights  of admission.   * I certify that at the point of admission it is my clinical judgment that the patient will require inpatient hospital care spanning beyond 2 midnights from the point of admission due to high intensity of service, high risk for further deterioration and high frequency of surveillance required.*   ------- I spent 57 minutes reviewing previous notes, at the bedside counseling/discussing the treatment plan, and performing clinical documentation.  Author: Marsha Ada, MD 03/17/2024 8:41 AM  For on call review [email or web address].

## 2024-03-18 DIAGNOSIS — N179 Acute kidney failure, unspecified: Secondary | ICD-10-CM | POA: Diagnosis not present

## 2024-03-18 LAB — BASIC METABOLIC PANEL WITH GFR
Anion gap: 14 (ref 5–15)
BUN: 53 mg/dL — ABNORMAL HIGH (ref 6–20)
CO2: 30 mmol/L (ref 22–32)
Calcium: 9.2 mg/dL (ref 8.9–10.3)
Chloride: 93 mmol/L — ABNORMAL LOW (ref 98–111)
Creatinine, Ser: 3.48 mg/dL — ABNORMAL HIGH (ref 0.61–1.24)
GFR, Estimated: 19 mL/min — ABNORMAL LOW (ref >60)
Glucose, Bld: 168 mg/dL — ABNORMAL HIGH (ref 70–99)
Potassium: 3.3 mmol/L — ABNORMAL LOW (ref 3.5–5.1)
Sodium: 137 mmol/L (ref 135–145)

## 2024-03-18 LAB — GLUCOSE, CAPILLARY
Glucose-Capillary: 164 mg/dL — ABNORMAL HIGH (ref 70–99)
Glucose-Capillary: 310 mg/dL — ABNORMAL HIGH (ref 70–99)
Glucose-Capillary: 273 mg/dL — ABNORMAL HIGH (ref 70–99)
Glucose-Capillary: 394 mg/dL — ABNORMAL HIGH (ref 70–99)

## 2024-03-18 LAB — SEDIMENTATION RATE: Sed Rate: 90 mm/h — ABNORMAL HIGH (ref 0–16)

## 2024-03-18 LAB — URIC ACID: Uric Acid, Serum: 15.2 mg/dL — ABNORMAL HIGH (ref 3.7–8.6)

## 2024-03-18 MED ORDER — HYDROMORPHONE HCL 1 MG/ML IJ SOLN
0.5000 mg | INTRAMUSCULAR | Status: DC | PRN
  Administered 2024-03-18: 0.5 mg via INTRAVENOUS
  Filled 2024-03-18: qty 1

## 2024-03-18 MED ORDER — POTASSIUM CHLORIDE 20 MEQ PO PACK
60.0000 meq | PACK | Freq: Once | ORAL | Status: AC
  Administered 2024-03-18: 60 meq via ORAL
  Filled 2024-03-18: qty 3

## 2024-03-18 MED ORDER — METHYLPREDNISOLONE SODIUM SUCC 40 MG IJ SOLR
40.0000 mg | Freq: Every day | INTRAMUSCULAR | Status: DC
  Administered 2024-03-18 – 2024-03-21 (×4): 40 mg via INTRAVENOUS
  Filled 2024-03-18 (×4): qty 1

## 2024-03-18 MED ORDER — ALLOPURINOL 100 MG PO TABS
100.0000 mg | ORAL_TABLET | Freq: Every day | ORAL | Status: DC
  Administered 2024-03-18 – 2024-03-24 (×7): 100 mg via ORAL
  Filled 2024-03-18 (×7): qty 1

## 2024-03-18 MED ORDER — METHOCARBAMOL 500 MG PO TABS
500.0000 mg | ORAL_TABLET | Freq: Three times a day (TID) | ORAL | Status: DC
Start: 2024-03-18 — End: 2024-03-24
  Administered 2024-03-18 – 2024-03-24 (×19): 500 mg via ORAL
  Filled 2024-03-18 (×19): qty 1

## 2024-03-18 MED ORDER — HYDRALAZINE HCL 10 MG PO TABS
10.0000 mg | ORAL_TABLET | Freq: Three times a day (TID) | ORAL | Status: DC
  Administered 2024-03-18 – 2024-03-24 (×18): 10 mg via ORAL
  Filled 2024-03-18 (×19): qty 1

## 2024-03-18 MED ORDER — CLONAZEPAM 0.5 MG PO TABS
0.5000 mg | ORAL_TABLET | Freq: Two times a day (BID) | ORAL | Status: DC | PRN
  Administered 2024-03-18 – 2024-03-24 (×8): 0.5 mg via ORAL
  Filled 2024-03-18 (×8): qty 1

## 2024-03-18 NOTE — Inpatient Diabetes Management (Signed)
 Inpatient Diabetes Program Recommendations  AACE/ADA: New Consensus Statement on Inpatient Glycemic Control (2015)  Target Ranges:  Prepandial:   less than 140 mg/dL      Peak postprandial:   less than 180 mg/dL (1-2 hours)      Critically ill patients:  140 - 180 mg/dL   Lab Results  Component Value Date   GLUCAP 310 (H) 03/18/2024   HGBA1C 8.5 (A) 12/15/2023    Latest Reference Range & Units 03/17/24 14:23 03/17/24 20:28 03/17/24 22:03 03/18/24 06:40 03/18/24 12:39  Glucose-Capillary 70 - 99 mg/dL 646 (H) 680 (H) 701 (H) 164 (H) 310 (H)  (H): Data is abnormally high Review of Glycemic Control  Diabetes history: DM2 Outpatient Diabetes medications: Lantus  30 units daily, Novolog  22 units BID with meals Current orders for Inpatient glycemic control: Lantus  30 units at HS, Novolog  0-15 units correction scale TID  Inpatient Diabetes Program Recommendations:   Noted that blood sugars have been greater than 180 mg/dl.  Recommend adding Novolog  5 units TID with meals if eating at least 50% of meals if blood sugars continue to be elevated.  Marjorie Lunger RN BSN CDE Diabetes Coordinator Pager: [phone]  8am-5pm

## 2024-03-18 NOTE — Progress Notes (Addendum)
 PROGRESS NOTE    [name]  FMW:[id number] DOB: 09/17/1965 DOA: 03/16/2024 PCP: Vicci Barnie NOVAK, MD  58/M with chronic systolic CHF with recovered EF, NICM, OSA intolerant to CPAP, CKD 4, type 2 diabetes mellitus, gout.  Has been admitted multiple times with CHF or kidney failure. - Recently admitted 9/25 after syncopal episode with polymorphic VT/VF terminated with ATP, loaded with amiodarone , no further VT transition to p.o. - Presented to the ED 10/9 with central chest pain, also reports choking on a drink a week ago, he took some Advil thereafter for pain relief despite CKD. - In the ED vital signs were stable, labs noted creatinine of 3.4, up from baseline of 2.3-2.6, CT chest noted developing patchy airspace infiltrates in the left upper lung on the background of chronic fibrosis and atelectasis. - Started on IV fluids for AKI, antibiotics and admitted   Subjective: -Complains of chest wall pain  Assessment and Plan:  AKI on CKD 4 - Etiology unclear, likely cardiorenal, monitor for retention - Clinically appears euvolemic, given IV fluids overnight in the ED, hold further fluids, diuretics on hold today - Will check renal ultrasound  Left upper lobe pneumonia - Continue IV ceftriaxone  and azithromycin  - Wean O2 - Check SLP eval, gives history concerning for choking and aspiration  Chest pain - Appears musculoskeletal, tender on palpation - Similar presentation 3 months ago, had gout in multiple joint spaces including costochondral - Check ESR and uric acid  Chronic systolic CHF - Last echo with a EF 45-50%, normal RV Clinically appears euvolemic now, holding torsemide  in the setting of worsening AKI, GDMT limited by CKD 4 - Continue metoprolol , Imdur, decrease hydralazine  dose  History of A-fib History of cardiac arrest sp ICD -Continue metoprolol , amiodarone  and Xarelto   Type 2 diabetes mellitus Continue Semglee , SSI  BPH - Continue Flomax    DVT  prophylaxis: Code Status:  Family Communication: Disposition Plan:   Consultants:    Procedures:   Antimicrobials:    Objective: Vitals:   03/17/24 2332 03/18/24 0000 03/18/24 0307 03/18/24 0745  BP:  111/63 116/68 129/62  Pulse: 76 73 74 73  Resp: 12 15 14 20   Temp:  98 F (36.7 C) 97.7 F (36.5 C) 97.7 F (36.5 C)  TempSrc:  Oral Oral Oral  SpO2: 97% 97% 90% 93%  Weight:      Height:        Intake/Output Summary (Last 24 hours) at 03/18/2024 1139 Last data filed at 03/18/2024 0900 Gross per 24 hour  Intake 120 ml  Output 250 ml  Net -130 ml   Filed Weights   03/16/24 1522 03/17/24 1719  Weight: 121.6 kg 123.2 kg    Examination:  General exam: Obese chronically ill-appearing Respiratory system: Few scattered rhonchi\  positive chest wall tenderness Cardiovascular system: S1 & S2 heard, RRR.  Abd: nondistended, soft and nontender.Normal bowel sounds heard. Central nervous system: Alert and oriented. No focal neurological deficits. Extremities: no edema Skin: No rashes Psychiatry:  Mood & affect appropriate.     Data Reviewed:   CBC: Recent Labs  Lab 03/16/24 1623  WBC 9.7  HGB 12.9*  HCT 39.2  MCV 90.7  PLT 282   Basic Metabolic Panel: Recent Labs  Lab 03/16/24 1623 03/18/24 0237  NA 139 137  K 3.7 3.3*  CL 90* 93*  CO2 30 30  GLUCOSE 225* 168*  BUN 39* 53*  CREATININE 3.46* 3.48*  CALCIUM  9.6 9.2  MG 1.7  --  GFR: Estimated Creatinine Clearance: 31 mL/min (A) (by C-G formula based on SCr of 3.48 mg/dL (H)). Liver Function Tests: Recent Labs  Lab 03/16/24 1623  AST 17  ALT 20  ALKPHOS 66  BILITOT 1.6*  PROT 8.2*  ALBUMIN  3.5   No results for input(s): LIPASE, AMYLASE in the last 168 hours. No results for input(s): AMMONIA in the last 168 hours. Coagulation Profile: No results for input(s): INR, PROTIME in the last 168 hours. Cardiac Enzymes: No results for input(s): CKTOTAL, CKMB, CKMBINDEX,  TROPONINI in the last 168 hours. BNP (last 3 results) No results for input(s): PROBNP in the last 8760 hours. HbA1C: No results for input(s): HGBA1C in the last 72 hours. CBG: Recent Labs  Lab 03/17/24 1423 03/17/24 2028 03/17/24 2203 03/18/24 0640  GLUCAP 353* 319* 298* 164*   Lipid Profile: No results for input(s): CHOL, HDL, LDLCALC, TRIG, CHOLHDL, LDLDIRECT in the last 72 hours. Thyroid  Function Tests: No results for input(s): TSH, T4TOTAL, FREET4, T3FREE, THYROIDAB in the last 72 hours. Anemia Panel: No results for input(s): VITAMINB12, FOLATE, FERRITIN, TIBC, IRON, RETICCTPCT in the last 72 hours. Urine analysis:    Component Value Date/Time   COLORURINE STRAW (A) 10/08/2023 0952   APPEARANCEUR CLEAR 10/08/2023 0952   APPEARANCEUR Clear 07/17/2020 1655   LABSPEC 1.009 10/08/2023 0952   PHURINE 5.0 10/08/2023 0952   GLUCOSEU 50 (A) 10/08/2023 0952   HGBUR NEGATIVE 10/08/2023 0952   BILIRUBINUR NEGATIVE 10/08/2023 0952   BILIRUBINUR negative 01/30/2022 1705   BILIRUBINUR Negative 07/17/2020 1655   KETONESUR NEGATIVE 10/08/2023 0952   PROTEINUR NEGATIVE 10/08/2023 0952   UROBILINOGEN 0.2 01/30/2022 1705   UROBILINOGEN 1.0 06/20/2013 1032   NITRITE NEGATIVE 10/08/2023 0952   LEUKOCYTESUR NEGATIVE 10/08/2023 0952   Sepsis Labs: @LABRCNTIP (procalcitonin:4,lacticidven:4)  ) Recent Results (from the past 240 hours)  Resp panel by RT-PCR (RSV, Flu A&B, Covid) Anterior Nasal Swab     Status: None   Collection Time: 03/16/24  3:32 PM   Specimen: Anterior Nasal Swab  Result Value Ref Range Status   SARS Coronavirus 2 by RT PCR NEGATIVE NEGATIVE Final   Influenza A by PCR NEGATIVE NEGATIVE Final   Influenza B by PCR NEGATIVE NEGATIVE Final    Comment: (NOTE) The Xpert Xpress SARS-CoV-2/FLU/RSV plus assay is intended as an aid in the diagnosis of influenza from Nasopharyngeal swab specimens and should not be used as a sole basis  for treatment. Nasal washings and aspirates are unacceptable for Xpert Xpress SARS-CoV-2/FLU/RSV testing.  Fact Sheet for Patients: BloggerCourse.com  Fact Sheet for Healthcare Providers: SeriousBroker.it  This test is not yet approved or cleared by the [place]  FDA and has been authorized for detection and/or diagnosis of SARS-CoV-2 by FDA under an Emergency Use Authorization (EUA). This EUA will remain in effect (meaning this test can be used) for the duration of the COVID-19 declaration under Section 564(b)(1) of the Act, 21 U.S.C. section 360bbb-3(b)(1), unless the authorization is terminated or revoked.     Resp Syncytial Virus by PCR NEGATIVE NEGATIVE Final    Comment: (NOTE) Fact Sheet for Patients: BloggerCourse.com  Fact Sheet for Healthcare Providers: SeriousBroker.it  This test is not yet approved or cleared by the [place]  FDA and has been authorized for detection and/or diagnosis of SARS-CoV-2 by FDA under an Emergency Use Authorization (EUA). This EUA will remain in effect (meaning this test can be used) for the duration of the COVID-19 declaration under Section 564(b)(1) of the Act, 21 U.S.C. section 360bbb-3(b)(1), unless  the authorization is terminated or revoked.  Performed at [facility], [address], [place], [place] [address]      Radiology Studies: ECHOCARDIOGRAM LIMITED Result Date: 03/17/2024    ECHOCARDIOGRAM LIMITED REPORT   Patient Name:   [name] Date of Exam: 03/17/2024 Medical Rec #:  [id number]      Height:       72.0 in Accession #:    [id number]     Weight:       268.0 lb Date of Birth:  January 02, 1966      BSA:          2.413 m Patient Age:    58 years       BP:           130/80 mmHg Patient Gender: M              HR:           78 bpm. Exam Location:  Inpatient Procedure: Limited Echo, Limited Color Doppler, Cardiac Doppler  and Intracardiac            Opacification Agent (Both Spectral and Color Flow Doppler were            utilized during procedure). Indications:    Chest Pain  History:        Patient has prior history of Echocardiogram examinations, most                 recent 02/26/2024. CHF, Defibrillator, CKD; Risk                 Factors:Diabetes, Dyslipidemia and Former Smoker.  Sonographer:    Koleen Popper RDCS Referring Phys: [id number] DARRYLE NED O'NEAL  Sonographer Comments: Patient is obese. Image acquisition challenging due to patient body habitus and Image acquisition challenging due to uncooperative patient. IMPRESSIONS  1. Left ventricular ejection fraction, by estimation, is 45 to 50%. The left ventricle has mildly decreased function. There is mild concentric left ventricular hypertrophy. Left ventricular diastolic function could not be evaluated.  2. Right ventricular systolic function is normal. The right ventricular size is normal.  3. The mitral valve is normal in structure. Trivial mitral valve regurgitation.  4. The aortic valve is tricuspid. There is mild calcification of the aortic valve. Aortic valve sclerosis/calcification is present, without any evidence of aortic stenosis.  5. Aortic dilatation noted. There is borderline dilatation of the aortic root, measuring 42 mm.  6. The inferior vena cava is normal in size with greater than 50% respiratory variability, suggesting right atrial pressure of 3 mmHg. Conclusion(s)/Recommendation(s): Limited echo with poor sound wave transmission. FINDINGS  Left Ventricle: Left ventricular ejection fraction, by estimation, is 45 to 50%. The left ventricle has mildly decreased function. Definity  contrast agent was given IV to delineate the left ventricular endocardial borders. There is mild concentric left ventricular hypertrophy. Left ventricular diastolic function could not be evaluated. Right Ventricle: The right ventricular size is normal. Right ventricular systolic  function is normal. Pericardium: There is no evidence of pericardial effusion. Mitral Valve: The mitral valve is normal in structure. Trivial mitral valve regurgitation. Tricuspid Valve: The tricuspid valve is normal in structure. Tricuspid valve regurgitation is trivial. Aortic Valve: The aortic valve is tricuspid. There is mild calcification of the aortic valve. Aortic valve sclerosis/calcification is present, without any evidence of aortic stenosis. Pulmonic Valve: The pulmonic valve was not assessed. Aorta: Aortic dilatation noted. There is borderline dilatation of the aortic root, measuring 42 mm.  Venous: The inferior vena cava is normal in size with greater than 50% respiratory variability, suggesting right atrial pressure of 3 mmHg. Additional Comments: Spectral Doppler performed. Color Doppler performed.  LEFT VENTRICLE PLAX 2D LVIDd:         7.30 cm   Diastology LVIDs:         4.70 cm   LV e' medial:    7.29 cm/s LV PW:         1.20 cm   LV E/e' medial:  7.9 LV IVS:        1.10 cm   LV e' lateral:   11.40 cm/s LVOT diam:     2.50 cm   LV E/e' lateral: 5.1 LV SV:         70 LV SV Index:   29 LVOT Area:     4.91 cm  RIGHT VENTRICLE             IVC RV S prime:     23.30 cm/s  IVC diam: 1.80 cm TAPSE (M-mode): 2.4 cm LEFT ATRIUM         Index LA diam:    3.70 cm 1.53 cm/m  AORTIC VALVE LVOT Vmax:   87.00 cm/s LVOT Vmean:  57.100 cm/s LVOT VTI:    0.142 m  AORTA Ao Root diam: 4.20 cm Ao Asc diam:  4.10 cm MITRAL VALVE MV Area (PHT): 3.31 cm    SHUNTS MV Decel Time: 229 msec    Systemic VTI:  0.14 m MV E velocity: 57.60 cm/s  Systemic Diam: 2.50 cm MV A velocity: 82.30 cm/s MV E/A ratio:  0.70 Toribio Fuel MD Electronically signed by Toribio Fuel MD Signature Date/Time: 03/17/2024/10:48:10 AM    Final    CT Chest Wo Contrast Result Date: 03/16/2024 CLINICAL DATA:  Right upper chest wall pain. No contrast due to low GFR. EXAM: CT CHEST WITHOUT CONTRAST TECHNIQUE: Multidetector CT imaging of the chest  was performed following the standard protocol without IV contrast. RADIATION DOSE REDUCTION: This exam was performed according to the departmental dose-optimization program which includes automated exposure control, adjustment of the mA and/or kV according to patient size and/or use of iterative reconstruction technique. COMPARISON:  Chest radiograph 03/16/2024. Radionuclide perfusion scan 12/27/2023. CT chest 12/27/2023 FINDINGS: Cardiovascular: Cardiac pacemaker. Cardiac enlargement. No pericardial effusions. Normal caliber thoracic aorta. No significant vascular calcifications. Mediastinum/Nodes: Thyroid  gland is unremarkable. No significant lymphadenopathy in the chest. Esophagus is decompressed. Lungs/Pleura: Mild emphysematous changes in the lungs. Patchy airspace infiltrates in the left upper lung with linear infiltration or atelectasis in the left greater than right lung base. This is progressing since the prior study and may represent developing pneumonia superimposed upon chronic fibrosis and atelectasis. No pleural effusion or pneumothorax. Upper Abdomen: No acute abnormality. Bilateral adrenal gland nodules, 1.9 cm on the right and 2.4 cm on the left. No change in size since prior studies dating back to 09/17/2017. Long-term stability is consistent with benign etiology and no imaging follow-up is indicated. Musculoskeletal: Degenerative changes in the spine. No acute bony abnormalities. IMPRESSION: 1. Cardiac enlargement. 2. Linear infiltration or atelectasis in the lung bases, greater on the left, similar to prior study. Developing patchy airspace infiltrates in the left upper lung. This likely represents developing pneumonia superimposed upon chronic fibrosis and atelectasis. 3. Bilateral adrenal gland nodules demonstrate long-term stability, likely benign. No imaging follow-up is indicated. Electronically Signed   By: Elsie Gravely M.D.   On: 03/16/2024 20:29   DG Chest 2  View Result Date:  03/16/2024 CLINICAL DATA:  Chest pain. EXAM: CHEST - 2 VIEW COMPARISON:  02/25/2024 FINDINGS: Stable appearance of the left chest ICD. Cardiac silhouette appears to be enlarged but stable. Prominent central vascular markings without overt pulmonary edema. No large pleural effusions. IMPRESSION: Cardiomegaly with prominent central vascular markings. Findings could represent vascular congestion. No overt pulmonary edema. Electronically Signed   By: Juliene Balder M.D.   On: 03/16/2024 16:28     Scheduled Meds:  acetaminophen   1,000 mg Oral Once   amiodarone   200 mg Oral Daily   atorvastatin   10 mg Oral Daily   azithromycin   500 mg Oral Daily   fluticasone  furoate-vilanterol  1 puff Inhalation Daily   gabapentin   300 mg Oral QHS   hydrALAZINE   25 mg Oral Q8H   insulin  aspart  0-15 Units Subcutaneous TID WC   insulin  glargine  30 Units Subcutaneous QHS   isosorbide mononitrate  15 mg Oral Daily   lidocaine   1 patch Transdermal Q24H   methocarbamol   500 mg Oral TID   metoprolol  succinate  12.5 mg Oral Daily   nystatin    Topical BID   pantoprazole   40 mg Oral Daily   potassium chloride   60 mEq Oral Once   prednisoLONE acetate  1 drop Left Eye QHS   rivaroxaban   20 mg Oral Q supper   sertraline   75 mg Oral q AM   tamsulosin   0.8 mg Oral Daily   Continuous Infusions:  cefTRIAXone  (ROCEPHIN )  IV 1 g (03/18/24 0649)     LOS: 1 day    Time spent:    Sigurd Pac, MD [facility]   03/18/2024, 11:39 AM

## 2024-03-18 NOTE — ED Provider Notes (Signed)
 I assumed care of this patient from previous provider.  Please see their note for further details of history, exam, and MDM.   Briefly patient is a 59 y.o. male who presented with chest pain, SOB, and lightheadedness. Cardiac work up thus far reassuring. Pending Cardiology consultation and recommendations.  W/u also noted possible pneumonia with mild AKI.  Admitted to medicine, cards to follow.       Trine Raynell Moder, MD 03/18/24 [phone]

## 2024-03-18 NOTE — Progress Notes (Signed)
   ICD was interrogated while in the ED on 03/17/2024. However, we were unable to see this report. EP APP was able to pull up the last interrogation. There are no transmission from 03/17/2024. Last transmission was from 03/15/2024 but it showed no tachyarrhythmias and no ICD shocks/ ATP since 03/10/2024.     Patient denied any ICD shocks on admission. No new recommendations at this time. Please see consult note from 03/17/2024 for more information. Please reach out to Cardiology if needed.  Neveah Bang E Santino Kinsella, PA-C 03/18/2024 1:07 PM

## 2024-03-18 NOTE — Evaluation (Signed)
 Clinical/Bedside Swallow Evaluation Patient Details  Name: [name] MRN: [id number] Date of Birth: 11/03/1965  Today's Date: 03/18/2024 Time: SLP Start Time (ACUTE ONLY): 1439 SLP Stop Time (ACUTE ONLY): 1450 SLP Time Calculation (min) (ACUTE ONLY): 11 min  Past Medical History:  Past Medical History:  Diagnosis Date   AICD (automatic cardioverter/defibrillator) present    Chronic bronchitis (HCC)    get it most q yr (12/23/2013)   Chronic systolic (congestive) heart failure (HCC)    Fracture of left humerus    a. 07/2013.   GERD (gastroesophageal reflux disease)    not at present time   Heart murmur    born w/it    History of renal calculi    Hypertension    Kidney stones    NICM (nonischemic cardiomyopathy) (HCC)    a. 07/2013 Echo: EF 20-25%, diff HK, Gr2 DD, mild MR, mod dil LA/RA. 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    Shockable heart rhythm detected by automated external defibrillator    Syncope    Type II diabetes mellitus (HCC) 2006   Past Surgical History:  Past Surgical History:  Procedure Laterality Date   CARDIAC CATHETERIZATION  08/10/13   CHOLECYSTECTOMY  ~ 2010   IMPLANTABLE CARDIOVERTER DEFIBRILLATOR IMPLANT  12/23/2013   STJ Fortify ICD implanted by Dr Kelsie for cardiomyopathy and syncope   IMPLANTABLE CARDIOVERTER DEFIBRILLATOR IMPLANT N/A 12/23/2013   Procedure: IMPLANTABLE CARDIOVERTER DEFIBRILLATOR IMPLANT;  Surgeon: Lynwood JONETTA Kelsie, MD;  Location: [facility] CATH LAB;  Service: Cardiovascular;  Laterality: N/A;   INGUINAL HERNIA REPAIR Left 03/01/2018   Procedure: OPEN REPAIR OF LEFT INGUINAL HERNIA WITH MESH;  Surgeon: Tanda Locus, MD;  Location: [facility] ORS;  Service: General;  Laterality: Left;   LEAD REVISION/REPAIR N/A 05/15/2022   Procedure: LEAD REVISION/REPAIR;  Surgeon: [name] Ole DASEN, MD;  Location: [facility] INVASIVE CV LAB;  Service: Cardiovascular;  Laterality: N/A;   LEFT  HEART CATHETERIZATION WITH CORONARY ANGIOGRAM N/A 08/10/2013   Procedure: LEFT HEART CATHETERIZATION WITH CORONARY ANGIOGRAM;  Surgeon: Candyce GORMAN Reek, MD;  Location: [facility] CATH LAB;  Service: Cardiovascular;  Laterality: N/A;   ORIF HUMERUS FRACTURE Left 08/12/2013   Procedure: OPEN REDUCTION INTERNAL FIXATION (ORIF) HUMERAL SHAFT FRACTURE;  Surgeon: Jerona LULLA Sage, MD;  Location: [facility] OR;  Service: Orthopedics;  Laterality: Left;  Open Reduction Internal Fixation Left Humerus   RIGHT/LEFT HEART CATH AND CORONARY ANGIOGRAPHY N/A 05/12/2022   Procedure: RIGHT/LEFT HEART CATH AND CORONARY ANGIOGRAPHY;  Surgeon: Gardenia Led, DO;  Location: [facility] INVASIVE CV LAB;  Service: Cardiovascular;  Laterality: N/A;   URETEROSCOPY     laser for kidney stones   HPI:  58 yo male presenting to ED 10/8 with central chest pain. Admitted with AKI and LUL pneumonia (CT shows linear infiltration or atelectasis in the lung bases and developing patchy airspace infiltrates in the LUL, likely represents developing pneumonia superimposed upon chronic fibrosis and atelectasis). Per MD note, pt reports choking when drinking recently. Seen by SLP 12/2023 with functional appearing oropharyngeal swallow and greater concern for esophageal dysphagia. PMH: chronic systolic CHF with recovered EF, NICM, OSA intolerant to CPAP, CKD 4, T2DM, gout    Assessment / Plan / Recommendation  Clinical Impression  Pt reports choking x2 within the past two weeks, which is uncharacteristic of his typical swallowing function. He fed himself regular solids, achieiving prompt oral clearance. No signs clinically concerning for aspiration were observed  with large, consecutive sips of thin liquids. Discussed completing an MBS for further assessment of his swallowing given complaints PTA and acute pneumonia. He wishes to proceed. SLP will f/u as scheduling allows for MBS. This could also be completed on an OP basis if needed. SLP Visit Diagnosis: Dysphagia,  unspecified (R13.10)    Aspiration Risk  Mild aspiration risk    Diet Recommendation Regular;Thin liquid    Liquid Administration via: Cup;Straw Medication Administration: Whole meds with liquid Supervision: Patient able to self feed;Intermittent supervision to cue for compensatory strategies Compensations: Minimize environmental distractions;Slow rate;Small sips/bites Postural Changes: Seated upright at 90 degrees;Remain upright for at least 30 minutes after po intake    Other  Recommendations Oral Care Recommendations: Oral care BID     Assistance Recommended at Discharge    Functional Status Assessment    Frequency and Duration            Prognosis Prognosis for improved oropharyngeal function: Good Barriers to Reach Goals: Time post onset      Swallow Study   General HPI: 58 yo male presenting to ED 10/8 with central chest pain. Admitted with AKI and LUL pneumonia (CT shows linear infiltration or atelectasis in the lung bases and developing patchy airspace infiltrates in the LUL, likely represents developing pneumonia superimposed upon chronic fibrosis and atelectasis). Per MD note, pt reports choking when drinking recently. Seen by SLP 12/2023 with functional appearing oropharyngeal swallow and greater concern for esophageal dysphagia. PMH: chronic systolic CHF with recovered EF, NICM, OSA intolerant to CPAP, CKD 4, T2DM, gout Type of Study: Bedside Swallow Evaluation Previous Swallow Assessment: see HPI Diet Prior to this Study: Regular;Thin liquids (Level 0) Temperature Spikes Noted: No Respiratory Status: Nasal cannula History of Recent Intubation: No Behavior/Cognition: Alert;Cooperative Oral Cavity Assessment: Within Functional Limits Oral Care Completed by SLP: No Oral Cavity - Dentition: Adequate natural dentition Vision: Functional for self-feeding Self-Feeding Abilities: Able to feed self Patient Positioning: Upright in bed Baseline Vocal Quality:  Normal Volitional Cough: Strong Volitional Swallow: Able to elicit    Oral/Motor/Sensory Function Overall Oral Motor/Sensory Function: Within functional limits   Ice Chips Ice chips: Not tested   Thin Liquid Thin Liquid: Within functional limits Presentation: Straw;Self Fed    Nectar Thick Nectar Thick Liquid: Not tested   Honey Thick Honey Thick Liquid: Not tested   Puree Puree: Not tested   Solid     Solid: Within functional limits Presentation: Self Fed      Damien Blumenthal, M.A., CCC-SLP Speech Language Pathology, Acute Rehabilitation Services  Secure Chat preferred [phone]  03/18/2024,3:11 PM

## 2024-03-18 NOTE — Plan of Care (Signed)
 Patient ID: [name], male   DOB: 12/07/1965, 58 y.o.   MRN: [id number]  Problem: Education: Goal: Ability to describe self-care measures that may prevent or decrease complications (Diabetes Survival Skills Education) will improve Outcome: Progressing Goal: Individualized Educational Video(s) Outcome: Progressing   Problem: Coping: Goal: Ability to adjust to condition or change in health will improve Outcome: Progressing   Problem: Fluid Volume: Goal: Ability to maintain a balanced intake and output will improve Outcome: Progressing   Problem: Health Behavior/Discharge Planning: Goal: Ability to identify and utilize available resources and services will improve Outcome: Progressing Goal: Ability to manage health-related needs will improve Outcome: Progressing   Problem: Metabolic: Goal: Ability to maintain appropriate glucose levels will improve Outcome: Progressing   Problem: Nutritional: Goal: Maintenance of adequate nutrition will improve Outcome: Progressing Goal: Progress toward achieving an optimal weight will improve Outcome: Progressing   Problem: Skin Integrity: Goal: Risk for impaired skin integrity will decrease Outcome: Progressing   Problem: Tissue Perfusion: Goal: Adequacy of tissue perfusion will improve Outcome: Progressing   Problem: Education: Goal: Knowledge of General Education information will improve Description: Including pain rating scale, medication(s)/side effects and non-pharmacologic comfort measures Outcome: Progressing   Problem: Health Behavior/Discharge Planning: Goal: Ability to manage health-related needs will improve Outcome: Progressing   Problem: Clinical Measurements: Goal: Ability to maintain clinical measurements within normal limits will improve Outcome: Progressing Goal: Will remain free from infection Outcome: Progressing Goal: Diagnostic test results will improve Outcome: Progressing Goal: Respiratory  complications will improve Outcome: Progressing Goal: Cardiovascular complication will be avoided Outcome: Progressing   Problem: Activity: Goal: Risk for activity intolerance will decrease Outcome: Progressing   Problem: Nutrition: Goal: Adequate nutrition will be maintained Outcome: Progressing   Problem: Coping: Goal: Level of anxiety will decrease Outcome: Progressing   Problem: Elimination: Goal: Will not experience complications related to bowel motility Outcome: Progressing Goal: Will not experience complications related to urinary retention Outcome: Progressing   Problem: Pain Managment: Goal: General experience of comfort will improve and/or be controlled Outcome: Progressing   Problem: Safety: Goal: Ability to remain free from injury will improve Outcome: Progressing   Problem: Skin Integrity: Goal: Risk for impaired skin integrity will decrease Outcome: Progressing  Verdie JONETTA Collier, RN

## 2024-03-19 ENCOUNTER — Inpatient Hospital Stay (HOSPITAL_COMMUNITY)

## 2024-03-19 DIAGNOSIS — N179 Acute kidney failure, unspecified: Secondary | ICD-10-CM | POA: Diagnosis not present

## 2024-03-19 LAB — CBC
HCT: 32.2 % — ABNORMAL LOW (ref 39.0–52.0)
Hemoglobin: 10.8 g/dL — ABNORMAL LOW (ref 13.0–17.0)
MCH: 29.6 pg (ref 26.0–34.0)
MCHC: 33.5 g/dL (ref 30.0–36.0)
MCV: 88.2 fL (ref 80.0–100.0)
Platelets: 292 K/uL (ref 150–400)
RBC: 3.65 MIL/uL — ABNORMAL LOW (ref 4.22–5.81)
RDW: 13.2 % (ref 11.5–15.5)
WBC: 8.4 K/uL (ref 4.0–10.5)
nRBC: 0 % (ref 0.0–0.2)

## 2024-03-19 LAB — GLUCOSE, CAPILLARY
Glucose-Capillary: 350 mg/dL — ABNORMAL HIGH (ref 70–99)
Glucose-Capillary: 298 mg/dL — ABNORMAL HIGH (ref 70–99)
Glucose-Capillary: 414 mg/dL — ABNORMAL HIGH (ref 70–99)
Glucose-Capillary: 345 mg/dL — ABNORMAL HIGH (ref 70–99)

## 2024-03-19 LAB — COMPREHENSIVE METABOLIC PANEL WITH GFR
ALT: 22 U/L (ref 0–44)
AST: 18 U/L (ref 15–41)
Albumin: 3 g/dL — ABNORMAL LOW (ref 3.5–5.0)
Alkaline Phosphatase: 62 U/L (ref 38–126)
Anion gap: 15 (ref 5–15)
BUN: 51 mg/dL — ABNORMAL HIGH (ref 6–20)
CO2: 26 mmol/L (ref 22–32)
Calcium: 9.3 mg/dL (ref 8.9–10.3)
Chloride: 93 mmol/L — ABNORMAL LOW (ref 98–111)
Creatinine, Ser: 2.95 mg/dL — ABNORMAL HIGH (ref 0.61–1.24)
GFR, Estimated: 24 mL/min — ABNORMAL LOW (ref >60)
Glucose, Bld: 424 mg/dL — ABNORMAL HIGH (ref 70–99)
Potassium: 3.7 mmol/L (ref 3.5–5.1)
Sodium: 134 mmol/L — ABNORMAL LOW (ref 135–145)
Total Bilirubin: 0.8 mg/dL (ref 0.0–1.2)
Total Protein: 7.5 g/dL (ref 6.5–8.1)

## 2024-03-19 MED ORDER — POTASSIUM CHLORIDE CRYS ER 20 MEQ PO TBCR
40.0000 meq | EXTENDED_RELEASE_TABLET | Freq: Once | ORAL | Status: AC
  Administered 2024-03-19: 40 meq via ORAL
  Filled 2024-03-19: qty 2

## 2024-03-19 MED ORDER — TORSEMIDE 20 MG PO TABS
20.0000 mg | ORAL_TABLET | Freq: Every day | ORAL | Status: DC
  Administered 2024-03-19 – 2024-03-21 (×3): 20 mg via ORAL
  Filled 2024-03-19 (×4): qty 1

## 2024-03-19 MED ORDER — INSULIN ASPART 100 UNIT/ML IJ SOLN
5.0000 [IU] | Freq: Three times a day (TID) | INTRAMUSCULAR | Status: DC
  Administered 2024-03-19 – 2024-03-20 (×3): 5 [IU] via SUBCUTANEOUS

## 2024-03-19 MED ORDER — INSULIN ASPART 100 UNIT/ML IJ SOLN: 3.0000 [IU] | Freq: Three times a day (TID) | INTRAMUSCULAR | Status: DC

## 2024-03-19 MED ORDER — INSULIN GLARGINE 100 UNIT/ML ~~LOC~~ SOLN
40.0000 [IU] | Freq: Every day | SUBCUTANEOUS | Status: DC
  Administered 2024-03-19 – 2024-03-20 (×2): 40 [IU] via SUBCUTANEOUS
  Filled 2024-03-19 (×3): qty 0.4

## 2024-03-19 NOTE — Progress Notes (Signed)
 PROGRESS NOTE    [name]  FMW:[id number] DOB: [birth year] DOA: 03/16/2024 PCP: Vicci Barnie NOVAK, MD  58/M with chronic systolic CHF with recovered EF, NICM, OSA intolerant to CPAP, CKD 4, type 2 diabetes mellitus, gout.  Has been admitted multiple times with CHF or kidney failure. - Recently admitted 9/25 after syncopal episode with polymorphic VT/VF terminated with ATP, loaded with amiodarone , no further VT transition to p.o. - Presented to the ED 10/9 with central chest pain, also reports choking on a drink a week ago, he took some Advil thereafter for pain relief despite CKD. - In the ED vital signs were stable, labs noted creatinine of 3.4, up from baseline of 2.3-2.6, CT chest noted developing patchy airspace infiltrates in the left upper lung on the background of chronic fibrosis and atelectasis. - Started on IV fluids for AKI, antibiotics and admitted   Subjective: -Complains of chest wall pain  Assessment and Plan:  AKI on CKD 4 - Etiology unclear, likely cardiorenal, monitor for retention - Improving, creatinine almost back to baseline, will restart torsemide  at lower dose today  Left upper lobe pneumonia - Continue IV ceftriaxone  and azithromycin  - Wean O2 - Check SLP eval, gives history concerning for choking and aspiration - Switch to oral ABX tomorrow  Chest wall hold pain - Appears musculoskeletal, tender on palpation - Similar presentation 3 months ago, had gout in multiple joint spaces including costochondral - ESR is 90 and uric acid is 15.2, 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 - Now started on IV steroids, allopurinol resumed  Chronic systolic CHF - Last echo with a EF 45-50%, normal RV Clinically appears euvolemic now, initially held torsemide  in the setting of worsening AKI, GDMT limited by CKD 4 - Continue metoprolol , Imdur, decrease hydralazine  dose - Restart torsemide  at lower dose today  History of A-fib History of cardiac arrest sp  ICD -Continue metoprolol , amiodarone  and Xarelto   Type 2 diabetes mellitus Continue Semglee , SSI - Dose increased, meal coverage  BPH - Continue Flomax    DVT prophylaxis: Xarelto  Code Status: Full code Family Communication: None present Disposition Plan: Home likely 48 hours  Consultants:    Procedures:   Antimicrobials:    Objective: Vitals:   03/19/24 0700 03/19/24 0815 03/19/24 0900 03/19/24 1011  BP:    120/66  Pulse: 71   79  Resp: 18  20 (!) 25  Temp:      TempSrc:      SpO2: (!) 88% 93% 93% 95%  Weight:      Height:        Intake/Output Summary (Last 24 hours) at 03/19/2024 1414 Last data filed at 03/19/2024 1012 Gross per 24 hour  Intake 320 ml  Output 1750 ml  Net -1430 ml   Filed Weights   03/16/24 1522 03/17/24 1719  Weight: 121.6 kg 123.2 kg    Examination:  General exam: Obese chronically ill-appearing Respiratory system: Few scattered rhonchi  positive chest wall tenderness Cardiovascular system: S1 & S2 heard, RRR.  Abd: nondistended, soft and nontender.Normal bowel sounds heard. Central nervous system: Alert and oriented. No focal neurological deficits. Extremities: no edema Skin: No rashes Psychiatry:  Mood & affect appropriate.     Data Reviewed:   CBC: Recent Labs  Lab 03/16/24 1623 03/19/24 0330  WBC 9.7 8.4  HGB 12.9* 10.8*  HCT 39.2 32.2*  MCV 90.7 88.2  PLT 282 292   Basic Metabolic Panel: Recent Labs  Lab 03/16/24 1623 03/18/24 0237 03/19/24 0330  NA 139 137 134*  K 3.7 3.3* 3.7  CL 90* 93* 93*  CO2 30 30 26   GLUCOSE 225* 168* 424*  BUN 39* 53* 51*  CREATININE 3.46* 3.48* 2.95*  CALCIUM  9.6 9.2 9.3  MG 1.7  --   --    GFR: Estimated Creatinine Clearance: 36.5 mL/min (A) (by C-G formula based on SCr of 2.95 mg/dL (H)). Liver Function Tests: Recent Labs  Lab 03/16/24 1623 03/19/24 0330  AST 17 18  ALT 20 22  ALKPHOS 66 62  BILITOT 1.6* 0.8  PROT 8.2* 7.5  ALBUMIN  3.5 3.0*   No results for  input(s): LIPASE, AMYLASE in the last 168 hours. No results for input(s): AMMONIA in the last 168 hours. Coagulation Profile: No results for input(s): INR, PROTIME in the last 168 hours. Cardiac Enzymes: No results for input(s): CKTOTAL, CKMB, CKMBINDEX, TROPONINI in the last 168 hours. BNP (last 3 results) No results for input(s): PROBNP in the last 8760 hours. HbA1C: No results for input(s): HGBA1C in the last 72 hours. CBG: Recent Labs  Lab 03/18/24 1239 03/18/24 1548 03/18/24 2150 03/19/24 0614 03/19/24 1131  GLUCAP 310* 273* 394* 350* 298*   Lipid Profile: No results for input(s): CHOL, HDL, LDLCALC, TRIG, CHOLHDL, LDLDIRECT in the last 72 hours. Thyroid  Function Tests: No results for input(s): TSH, T4TOTAL, FREET4, T3FREE, THYROIDAB in the last 72 hours. Anemia Panel: No results for input(s): VITAMINB12, FOLATE, FERRITIN, TIBC, IRON, RETICCTPCT in the last 72 hours. Urine analysis:    Component Value Date/Time   COLORURINE STRAW (A) 10/08/2023 0952   APPEARANCEUR CLEAR 10/08/2023 0952   APPEARANCEUR Clear 07/17/2020 1655   LABSPEC 1.009 10/08/2023 0952   PHURINE 5.0 10/08/2023 0952   GLUCOSEU 50 (A) 10/08/2023 0952   HGBUR NEGATIVE 10/08/2023 0952   BILIRUBINUR NEGATIVE 10/08/2023 0952   BILIRUBINUR negative 01/30/2022 1705   BILIRUBINUR Negative 07/17/2020 1655   KETONESUR NEGATIVE 10/08/2023 0952   PROTEINUR NEGATIVE 10/08/2023 0952   UROBILINOGEN 0.2 01/30/2022 1705   UROBILINOGEN 1.0 06/20/2013 1032   NITRITE NEGATIVE 10/08/2023 0952   LEUKOCYTESUR NEGATIVE 10/08/2023 0952   Sepsis Labs: @LABRCNTIP (procalcitonin:4,lacticidven:4)  ) Recent Results (from the past 240 hours)  Resp panel by RT-PCR (RSV, Flu A&B, Covid) Anterior Nasal Swab     Status: None   Collection Time: 03/16/24  3:32 PM   Specimen: Anterior Nasal Swab  Result Value Ref Range Status   SARS Coronavirus 2 by RT PCR NEGATIVE NEGATIVE  Final   Influenza A by PCR NEGATIVE NEGATIVE Final   Influenza B by PCR NEGATIVE NEGATIVE Final    Comment: (NOTE) The Xpert Xpress SARS-CoV-2/FLU/RSV plus assay is intended as an aid in the diagnosis of influenza from Nasopharyngeal swab specimens and should not be used as a sole basis for treatment. Nasal washings and aspirates are unacceptable for Xpert Xpress SARS-CoV-2/FLU/RSV testing.  Fact Sheet for Patients: BloggerCourse.com  Fact Sheet for Healthcare Providers: SeriousBroker.it  This test is not yet approved or cleared by the [place]  FDA and has been authorized for detection and/or diagnosis of SARS-CoV-2 by FDA under an Emergency Use Authorization (EUA). This EUA will remain in effect (meaning this test can be used) for the duration of the COVID-19 declaration under Section 564(b)(1) of the Act, 21 U.S.C. section 360bbb-3(b)(1), unless the authorization is terminated or revoked.     Resp Syncytial Virus by PCR NEGATIVE NEGATIVE Final    Comment: (NOTE) Fact Sheet for Patients: BloggerCourse.com  Fact Sheet for Healthcare Providers: SeriousBroker.it  This test is not yet approved or cleared by the [place]  FDA and has been authorized for detection and/or diagnosis of SARS-CoV-2 by FDA under an Emergency Use Authorization (EUA). This EUA will remain in effect (meaning this test can be used) for the duration of the COVID-19 declaration under Section 564(b)(1) of the Act, 21 U.S.C. section 360bbb-3(b)(1), unless the authorization is terminated or revoked.  Performed at [facility], [address], [place], [place] [address]      Radiology Studies: US  RENAL Result Date: 03/19/2024 CLINICAL DATA:  [id number] AKI (acute kidney injury) 409830. EXAM: RENAL / URINARY TRACT ULTRASOUND COMPLETE COMPARISON:  None Available. FINDINGS: Right Kidney: Renal  measurements: 4.9 x 5.6 x 9.8 cm = volume: 139.5 mL. Echogenicity within normal limits. No mass or hydronephrosis visualized. Left Kidney: Renal measurements: 4.6 x 5.1 x 10.6 cm = volume: 130.7 mL. Echogenicity within normal limits. No mass or hydronephrosis visualized. Bladder: Appears normal for degree of bladder distention. Other: None. IMPRESSION: Unremarkable renal ultrasound examination. Electronically Signed   By: Ree Molt M.D.   On: 03/19/2024 13:22     Scheduled Meds:  acetaminophen   1,000 mg Oral Once   allopurinol  100 mg Oral Daily   amiodarone   200 mg Oral Daily   atorvastatin   10 mg Oral Daily   fluticasone  furoate-vilanterol  1 puff Inhalation Daily   gabapentin   300 mg Oral QHS   hydrALAZINE   10 mg Oral Q8H   insulin  aspart  0-15 Units Subcutaneous TID WC   insulin  aspart  5 Units Subcutaneous TID WC   insulin  glargine  40 Units Subcutaneous Daily   isosorbide mononitrate  15 mg Oral Daily   lidocaine   1 patch Transdermal Q24H   methocarbamol   500 mg Oral TID   methylPREDNISolone  (SOLU-MEDROL ) injection  40 mg Intravenous Daily   metoprolol  succinate  12.5 mg Oral Daily   nystatin    Topical BID   pantoprazole   40 mg Oral Daily   potassium chloride   40 mEq Oral Once   prednisoLONE acetate  1 drop Left Eye QHS   rivaroxaban   20 mg Oral Q supper   sertraline   75 mg Oral q AM   tamsulosin   0.8 mg Oral Daily   torsemide   20 mg Oral Daily   Continuous Infusions:  cefTRIAXone  (ROCEPHIN )  IV Stopped (03/19/24 9341)     LOS: 2 days    Time spent:    Sigurd Pac, MD [facility]   03/19/2024, 2:14 PM

## 2024-03-19 NOTE — Plan of Care (Signed)
   Problem: Education: Goal: Ability to describe self-care measures that may prevent or decrease complications (Diabetes Survival Skills Education) will improve Outcome: Progressing

## 2024-03-20 DIAGNOSIS — N179 Acute kidney failure, unspecified: Secondary | ICD-10-CM | POA: Diagnosis not present

## 2024-03-20 LAB — CBC
HCT: 32.7 % — ABNORMAL LOW (ref 39.0–52.0)
Hemoglobin: 11.1 g/dL — ABNORMAL LOW (ref 13.0–17.0)
MCH: 29.8 pg (ref 26.0–34.0)
MCHC: 33.9 g/dL (ref 30.0–36.0)
MCV: 87.7 fL (ref 80.0–100.0)
Platelets: 311 K/uL (ref 150–400)
RBC: 3.73 MIL/uL — ABNORMAL LOW (ref 4.22–5.81)
RDW: 13.3 % (ref 11.5–15.5)
WBC: 10.9 K/uL — ABNORMAL HIGH (ref 4.0–10.5)
nRBC: 0 % (ref 0.0–0.2)

## 2024-03-20 LAB — GLUCOSE, CAPILLARY
Glucose-Capillary: 226 mg/dL — ABNORMAL HIGH (ref 70–99)
Glucose-Capillary: 213 mg/dL — ABNORMAL HIGH (ref 70–99)
Glucose-Capillary: 328 mg/dL — ABNORMAL HIGH (ref 70–99)
Glucose-Capillary: 335 mg/dL — ABNORMAL HIGH (ref 70–99)

## 2024-03-20 LAB — COMPREHENSIVE METABOLIC PANEL WITH GFR
ALT: 21 U/L (ref 0–44)
AST: 16 U/L (ref 15–41)
Albumin: 3 g/dL — ABNORMAL LOW (ref 3.5–5.0)
Alkaline Phosphatase: 56 U/L (ref 38–126)
Anion gap: 13 (ref 5–15)
BUN: 54 mg/dL — ABNORMAL HIGH (ref 6–20)
CO2: 30 mmol/L (ref 22–32)
Calcium: 9.4 mg/dL (ref 8.9–10.3)
Chloride: 94 mmol/L — ABNORMAL LOW (ref 98–111)
Creatinine, Ser: 2.61 mg/dL — ABNORMAL HIGH (ref 0.61–1.24)
GFR, Estimated: 27 mL/min — ABNORMAL LOW (ref >60)
Glucose, Bld: 247 mg/dL — ABNORMAL HIGH (ref 70–99)
Potassium: 3.4 mmol/L — ABNORMAL LOW (ref 3.5–5.1)
Sodium: 137 mmol/L (ref 135–145)
Total Bilirubin: 0.6 mg/dL (ref 0.0–1.2)
Total Protein: 7.2 g/dL (ref 6.5–8.1)

## 2024-03-20 MED ORDER — CEFUROXIME AXETIL 250 MG PO TABS: 500.0000 mg | ORAL_TABLET | Freq: Two times a day (BID) | ORAL | Status: DC

## 2024-03-20 MED ORDER — AMOXICILLIN-POT CLAVULANATE 875-125 MG PO TABS
1.0000 | ORAL_TABLET | Freq: Two times a day (BID) | ORAL | Status: AC
Start: 2024-03-20 — End: 2024-03-23
  Administered 2024-03-20 – 2024-03-22 (×6): 1 via ORAL
  Filled 2024-03-20 (×6): qty 1

## 2024-03-20 MED ORDER — INSULIN ASPART 100 UNIT/ML IJ SOLN
6.0000 [IU] | Freq: Three times a day (TID) | INTRAMUSCULAR | Status: DC
  Administered 2024-03-20 – 2024-03-23 (×9): 6 [IU] via SUBCUTANEOUS

## 2024-03-20 MED ORDER — POTASSIUM CHLORIDE CRYS ER 20 MEQ PO TBCR
40.0000 meq | EXTENDED_RELEASE_TABLET | Freq: Once | ORAL | Status: AC
  Administered 2024-03-20: 40 meq via ORAL
  Filled 2024-03-20: qty 2

## 2024-03-20 NOTE — Progress Notes (Signed)
 PROGRESS NOTE    [name]  FMW:[id number] DOB: 10/24/1965 DOA: 03/16/2024 PCP: Vicci Barnie NOVAK, MD  58/M with chronic systolic CHF with recovered EF, NICM, OSA intolerant to CPAP, CKD 4, type 2 diabetes mellitus, gout.  Has been admitted multiple times with CHF or kidney failure. - Recently admitted 9/25 after syncopal episode with polymorphic VT/VF terminated with ATP, loaded with amiodarone , no further VT transition to p.o. - Presented to the ED 10/9 with central chest pain, also reports choking on a drink a week ago, he took some Advil thereafter for pain relief despite CKD. - In the ED vital signs were stable, labs noted creatinine of 3.4, up from baseline of 2.3-2.6, CT chest noted developing patchy airspace infiltrates in the left upper lung on the background of chronic fibrosis and atelectasis. - Started on IV fluids for AKI, antibiotics and admitted  Subjective: - Breathing a little better, chest wall pain and shoulder pains improving  Assessment and Plan:  AKI on CKD 4 - Etiology unclear, likely cardiorenal, monitor for retention - Improving, creatinine almost back to baseline, - Restarted torsemide   Left upper lobe pneumonia - Treated with IV ceftriaxone  and azithromycin , - Wean O2 - SLP following, plan for MBS - Switch to oral ABX today  Chest wall pain, shoulder pain -Musculoskeletal, tender on palpation - Similar presentation 3 months ago, had gout in multiple joint spaces including costochondral - ESR is 90 and uric acid is 15.2, 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 - Continue IV steroids today, allopurinol  Chronic systolic CHF - Last echo with a EF 45-50%, normal RV Clinically appears euvolemic now, initially held torsemide  in the setting of worsening AKI, GDMT limited by CKD 4 - Continue metoprolol , Imdur, decrease hydralazine  dose - Continue oral torsemide   History of A-fib History of cardiac arrest sp ICD -Continue metoprolol , amiodarone   and Xarelto   Type 2 diabetes mellitus Continue Semglee , SSI - Dose increased, meal coverage  BPH - Continue Flomax    DVT prophylaxis: Xarelto  Code Status: Full code Family Communication: None present Disposition Plan: Home likely 48 hours  Consultants:    Procedures:   Antimicrobials:    Objective: Vitals:   03/19/24 1712 03/19/24 2130 03/20/24 0617 03/20/24 0815  BP: 119/65 111/77 120/71 121/68  Pulse: 70 66 68 64  Resp: 17 18 17 18   Temp: 97.6 F (36.4 C) 98 F (36.7 C) 98.6 F (37 C) 97.6 F (36.4 C)  TempSrc: Oral Oral Oral Oral  SpO2: 90% 92% 95% 93%  Weight:      Height:        Intake/Output Summary (Last 24 hours) at 03/20/2024 1122 Last data filed at 03/20/2024 9178 Gross per 24 hour  Intake 600 ml  Output --  Net 600 ml   Filed Weights   03/16/24 1522 03/17/24 1719  Weight: 121.6 kg 123.2 kg    Examination:  General exam: Obese chronically ill-appearing Respiratory system: Few scattered rhonchi  positive chest wall tenderness Cardiovascular system: S1 & S2 heard, RRR.  Abd: nondistended, soft and nontender.Normal bowel sounds heard. Central nervous system: Alert and oriented. No focal neurological deficits. Extremities: no edema Skin: No rashes Psychiatry:  Mood & affect appropriate.     Data Reviewed:   CBC: Recent Labs  Lab 03/16/24 1623 03/19/24 0330 03/20/24 0234  WBC 9.7 8.4 10.9*  HGB 12.9* 10.8* 11.1*  HCT 39.2 32.2* 32.7*  MCV 90.7 88.2 87.7  PLT 282 292 311   Basic Metabolic Panel: Recent Labs  Lab 03/16/24 1623 03/18/24 0237 03/19/24 0330 03/20/24 0234  NA 139 137 134* 137  K 3.7 3.3* 3.7 3.4*  CL 90* 93* 93* 94*  CO2 30 30 26 30   GLUCOSE 225* 168* 424* 247*  BUN 39* 53* 51* 54*  CREATININE 3.46* 3.48* 2.95* 2.61*  CALCIUM  9.6 9.2 9.3 9.4  MG 1.7  --   --   --    GFR: Estimated Creatinine Clearance: 41.3 mL/min (A) (by C-G formula based on SCr of 2.61 mg/dL (H)). Liver Function Tests: Recent Labs   Lab 03/16/24 1623 03/19/24 0330 03/20/24 0234  AST 17 18 16   ALT 20 22 21   ALKPHOS 66 62 56  BILITOT 1.6* 0.8 0.6  PROT 8.2* 7.5 7.2  ALBUMIN  3.5 3.0* 3.0*   No results for input(s): LIPASE, AMYLASE in the last 168 hours. No results for input(s): AMMONIA in the last 168 hours. Coagulation Profile: No results for input(s): INR, PROTIME in the last 168 hours. Cardiac Enzymes: No results for input(s): CKTOTAL, CKMB, CKMBINDEX, TROPONINI in the last 168 hours. BNP (last 3 results) No results for input(s): PROBNP in the last 8760 hours. HbA1C: No results for input(s): HGBA1C in the last 72 hours. CBG: Recent Labs  Lab 03/19/24 0614 03/19/24 1131 03/19/24 1625 03/19/24 2123 03/20/24 0618  GLUCAP 350* 298* 414* 345* 226*   Lipid Profile: No results for input(s): CHOL, HDL, LDLCALC, TRIG, CHOLHDL, LDLDIRECT in the last 72 hours. Thyroid  Function Tests: No results for input(s): TSH, T4TOTAL, FREET4, T3FREE, THYROIDAB in the last 72 hours. Anemia Panel: No results for input(s): VITAMINB12, FOLATE, FERRITIN, TIBC, IRON, RETICCTPCT in the last 72 hours. Urine analysis:    Component Value Date/Time   COLORURINE STRAW (A) 10/08/2023 0952   APPEARANCEUR CLEAR 10/08/2023 0952   APPEARANCEUR Clear 07/17/2020 1655   LABSPEC 1.009 10/08/2023 0952   PHURINE 5.0 10/08/2023 0952   GLUCOSEU 50 (A) 10/08/2023 0952   HGBUR NEGATIVE 10/08/2023 0952   BILIRUBINUR NEGATIVE 10/08/2023 0952   BILIRUBINUR negative 01/30/2022 1705   BILIRUBINUR Negative 07/17/2020 1655   KETONESUR NEGATIVE 10/08/2023 0952   PROTEINUR NEGATIVE 10/08/2023 0952   UROBILINOGEN 0.2 01/30/2022 1705   UROBILINOGEN 1.0 06/20/2013 1032   NITRITE NEGATIVE 10/08/2023 0952   LEUKOCYTESUR NEGATIVE 10/08/2023 0952   Sepsis Labs: @LABRCNTIP (procalcitonin:4,lacticidven:4)  ) Recent Results (from the past 240 hours)  Resp panel by RT-PCR (RSV, Flu A&B, Covid)  Anterior Nasal Swab     Status: None   Collection Time: 03/16/24  3:32 PM   Specimen: Anterior Nasal Swab  Result Value Ref Range Status   SARS Coronavirus 2 by RT PCR NEGATIVE NEGATIVE Final   Influenza A by PCR NEGATIVE NEGATIVE Final   Influenza B by PCR NEGATIVE NEGATIVE Final    Comment: (NOTE) The Xpert Xpress SARS-CoV-2/FLU/RSV plus assay is intended as an aid in the diagnosis of influenza from Nasopharyngeal swab specimens and should not be used as a sole basis for treatment. Nasal washings and aspirates are unacceptable for Xpert Xpress SARS-CoV-2/FLU/RSV testing.  Fact Sheet for Patients: BloggerCourse.com  Fact Sheet for Healthcare Providers: SeriousBroker.it  This test is not yet approved or cleared by the [place]  FDA and has been authorized for detection and/or diagnosis of SARS-CoV-2 by FDA under an Emergency Use Authorization (EUA). This EUA will remain in effect (meaning this test can be used) for the duration of the COVID-19 declaration under Section 564(b)(1) of the Act, 21 U.S.C. section 360bbb-3(b)(1), unless the authorization is terminated or revoked.  Resp Syncytial Virus by PCR NEGATIVE NEGATIVE Final    Comment: (NOTE) Fact Sheet for Patients: BloggerCourse.com  Fact Sheet for Healthcare Providers: SeriousBroker.it  This test is not yet approved or cleared by the [place]  FDA and has been authorized for detection and/or diagnosis of SARS-CoV-2 by FDA under an Emergency Use Authorization (EUA). This EUA will remain in effect (meaning this test can be used) for the duration of the COVID-19 declaration under Section 564(b)(1) of the Act, 21 U.S.C. section 360bbb-3(b)(1), unless the authorization is terminated or revoked.  Performed at [facility], [address] [place], [place] [address]      Radiology Studies: US   RENAL Result Date: 03/19/2024 CLINICAL DATA:  [id number] AKI (acute kidney injury) 409830. EXAM: RENAL / URINARY TRACT ULTRASOUND COMPLETE COMPARISON:  None Available. FINDINGS: Right Kidney: Renal measurements: 4.9 x 5.6 x 9.8 cm = volume: 139.5 mL. Echogenicity within normal limits. No mass or hydronephrosis visualized. Left Kidney: Renal measurements: 4.6 x 5.1 x 10.6 cm = volume: 130.7 mL. Echogenicity within normal limits. No mass or hydronephrosis visualized. Bladder: Appears normal for degree of bladder distention. Other: None. IMPRESSION: Unremarkable renal ultrasound examination. Electronically Signed   By: Ree Molt M.D.   On: 03/19/2024 13:22     Scheduled Meds:  acetaminophen   1,000 mg Oral Once   allopurinol  100 mg Oral Daily   amiodarone   200 mg Oral Daily   atorvastatin   10 mg Oral Daily   fluticasone  furoate-vilanterol  1 puff Inhalation Daily   gabapentin   300 mg Oral QHS   hydrALAZINE   10 mg Oral Q8H   insulin  aspart  0-15 Units Subcutaneous TID WC   insulin  aspart  5 Units Subcutaneous TID WC   insulin  glargine  40 Units Subcutaneous Daily   isosorbide mononitrate  15 mg Oral Daily   lidocaine   1 patch Transdermal Q24H   methocarbamol   500 mg Oral TID   methylPREDNISolone  (SOLU-MEDROL ) injection  40 mg Intravenous Daily   metoprolol  succinate  12.5 mg Oral Daily   nystatin    Topical BID   pantoprazole   40 mg Oral Daily   prednisoLONE acetate  1 drop Left Eye QHS   rivaroxaban   20 mg Oral Q supper   sertraline   75 mg Oral q AM   tamsulosin   0.8 mg Oral Daily   torsemide   20 mg Oral Daily   Continuous Infusions:  cefTRIAXone  (ROCEPHIN )  IV 1 g (03/20/24 0630)     LOS: 3 days    Time spent:    Sigurd Pac, MD [facility]   03/20/2024, 11:22 AM

## 2024-03-20 NOTE — Plan of Care (Signed)
   Problem: Coping: Goal: Ability to adjust to condition or change in health will improve Outcome: Progressing

## 2024-03-20 NOTE — Plan of Care (Signed)
 Pt transitioned to PO abx.   PRN Klonopin  requested by pt. States he takes daily morning and night at home.  Pt calm and cooperative. Slept most of the day.    Latest Reference Range & Units 03/20/24 11:36 03/20/24 15:52  Glucose-Capillary 70 - 99 mg/dL 786 (H) 671 (H)  (H): Data is abnormally high   Problem: Metabolic: Goal: Ability to maintain appropriate glucose levels will improve 03/20/2024 1843 by Javier Ip, RN Outcome: Progressing 03/20/2024 1842 by Javier Ip, RN Outcome: Progressing   Problem: Nutritional: Goal: Maintenance of adequate nutrition will improve Outcome: Progressing   Problem: Education: Goal: Knowledge of General Education information will improve Description: Including pain rating scale, medication(s)/side effects and non-pharmacologic comfort measures 03/20/2024 1843 by Javier Ip, RN Outcome: Progressing 03/20/2024 1842 by Javier Ip, RN Outcome: Progressing   Problem: Clinical Measurements: Goal: Ability to maintain clinical measurements within normal limits will improve 03/20/2024 1843 by Javier Ip, RN Outcome: Progressing 03/20/2024 1842 by Javier Ip, RN Outcome: Progressing   Problem: Nutrition: Goal: Adequate nutrition will be maintained Outcome: Progressing   Problem: Coping: Goal: Level of anxiety will decrease 03/20/2024 1843 by Javier Ip, RN Outcome: Progressing 03/20/2024 1842 by Javier Ip, RN Outcome: Progressing

## 2024-03-21 ENCOUNTER — Inpatient Hospital Stay (HOSPITAL_COMMUNITY)

## 2024-03-21 ENCOUNTER — Encounter: Payer: Self-pay | Admitting: Cardiology

## 2024-03-21 ENCOUNTER — Ambulatory Visit (INDEPENDENT_AMBULATORY_CARE_PROVIDER_SITE_OTHER)

## 2024-03-21 DIAGNOSIS — N179 Acute kidney failure, unspecified: Secondary | ICD-10-CM | POA: Diagnosis not present

## 2024-03-21 DIAGNOSIS — Z95810 Presence of automatic (implantable) cardiac defibrillator: Secondary | ICD-10-CM

## 2024-03-21 DIAGNOSIS — I5022 Chronic systolic (congestive) heart failure: Secondary | ICD-10-CM

## 2024-03-21 LAB — BASIC METABOLIC PANEL WITH GFR
Anion gap: 13 (ref 5–15)
BUN: 58 mg/dL — ABNORMAL HIGH (ref 6–20)
CO2: 27 mmol/L (ref 22–32)
Calcium: 9.3 mg/dL (ref 8.9–10.3)
Chloride: 96 mmol/L — ABNORMAL LOW (ref 98–111)
Creatinine, Ser: 2.47 mg/dL — ABNORMAL HIGH (ref 0.61–1.24)
GFR, Estimated: 29 mL/min — ABNORMAL LOW (ref >60)
Glucose, Bld: 258 mg/dL — ABNORMAL HIGH (ref 70–99)
Potassium: 3.7 mmol/L (ref 3.5–5.1)
Sodium: 136 mmol/L (ref 135–145)

## 2024-03-21 LAB — GLUCOSE, CAPILLARY
Glucose-Capillary: 181 mg/dL — ABNORMAL HIGH (ref 70–99)
Glucose-Capillary: 266 mg/dL — ABNORMAL HIGH (ref 70–99)
Glucose-Capillary: 391 mg/dL — ABNORMAL HIGH (ref 70–99)
Glucose-Capillary: 351 mg/dL — ABNORMAL HIGH (ref 70–99)

## 2024-03-21 LAB — SEDIMENTATION RATE: Sed Rate: 70 mm/h — ABNORMAL HIGH (ref 0–16)

## 2024-03-21 MED ORDER — PREDNISONE 20 MG PO TABS
40.0000 mg | ORAL_TABLET | Freq: Every day | ORAL | Status: DC
  Administered 2024-03-21 – 2024-03-24 (×4): 40 mg via ORAL
  Filled 2024-03-21 (×4): qty 2

## 2024-03-21 MED ORDER — INSULIN GLARGINE 100 UNIT/ML ~~LOC~~ SOLN
50.0000 [IU] | Freq: Every day | SUBCUTANEOUS | Status: DC
  Administered 2024-03-21 – 2024-03-23 (×3): 50 [IU] via SUBCUTANEOUS
  Filled 2024-03-21 (×4): qty 0.5

## 2024-03-21 NOTE — Plan of Care (Signed)
  Problem: Coping: Goal: Ability to adjust to condition or change in health will improve Outcome: Progressing   Problem: Fluid Volume: Goal: Ability to maintain a balanced intake and output will improve Outcome: Progressing   Problem: Metabolic: Goal: Ability to maintain appropriate glucose levels will improve Outcome: Progressing   Problem: Nutritional: Goal: Maintenance of adequate nutrition will improve Outcome: Progressing   Problem: Skin Integrity: Goal: Risk for impaired skin integrity will decrease Outcome: Progressing   Problem: Tissue Perfusion: Goal: Adequacy of tissue perfusion will improve Outcome: Progressing   Problem: Education: Goal: Knowledge of General Education information will improve Description: Including pain rating scale, medication(s)/side effects and non-pharmacologic comfort measures Outcome: Progressing   Problem: Clinical Measurements: Goal: Ability to maintain clinical measurements within normal limits will improve Outcome: Progressing Goal: Diagnostic test results will improve Outcome: Progressing Goal: Respiratory complications will improve Outcome: Progressing Goal: Cardiovascular complication will be avoided Outcome: Progressing   Problem: Activity: Goal: Risk for activity intolerance will decrease Outcome: Progressing   Problem: Nutrition: Goal: Adequate nutrition will be maintained Outcome: Progressing   Problem: Coping: Goal: Level of anxiety will decrease Outcome: Progressing   Problem: Elimination: Goal: Will not experience complications related to bowel motility Outcome: Progressing Goal: Will not experience complications related to urinary retention Outcome: Progressing   Problem: Pain Managment: Goal: General experience of comfort will improve and/or be controlled Outcome: Progressing   Problem: Safety: Goal: Ability to remain free from injury will improve Outcome: Progressing   Problem: Skin Integrity: Goal:  Risk for impaired skin integrity will decrease Outcome: Progressing

## 2024-03-21 NOTE — Progress Notes (Signed)
 PROGRESS NOTE    [name]  FMW:[id number] DOB: [birth year] DOA: 03/16/2024 PCP: Vicci Barnie NOVAK, MD  58/M with chronic systolic CHF with recovered EF, NICM, OSA intolerant to CPAP, CKD 4, type 2 diabetes mellitus, gout.  Has been admitted multiple times with CHF or kidney failure. - Recently admitted 9/25 after syncopal episode with polymorphic VT/VF terminated with ATP, loaded with amiodarone , no further VT transition to p.o. - Presented to the ED 10/9 with central chest pain, also reports choking on a drink a week ago, he took some Advil thereafter for pain relief despite CKD. - In the ED vital signs were stable, labs noted creatinine of 3.4, up from baseline of 2.3-2.6, CT chest noted developing patchy airspace infiltrates in the left upper lung on the background of chronic fibrosis and atelectasis. - Started on IV fluids for AKI, antibiotics and admitted - Chest wall and shoulder pains, ESR and uric acid significantly elevated, started on IV steroids again  Subjective: - Feels better overall, breathing improving, chest wall pain and shoulder pains are better as well  Assessment and Plan:  AKI on CKD 4 - Etiology unclear, likely cardiorenal, monitor for retention - Improving, creatinine almost back to baseline, - Restarted torsemide   Left upper lobe pneumonia - Treated with IV ceftriaxone  and azithromycin ,, switch to oral ABX - Wean O2 off - SLP following, MBS completed, no concerns for overt dysphagia  Chest wall pain, shoulder pain -Musculoskeletal, tender on palpation - Similar presentation 3 months ago, had gout in multiple joint spaces including costochondral - ESR is 90 and uric acid is 15.2, 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 - Improving on IV Solu-Medrol , switch to oral prednisone  tomorrow, continue allopurinol - Advised limiting animal protein  Chronic systolic CHF - Last echo with a EF 45-50%, normal RV Clinically appears euvolemic now, initially  held torsemide  in the setting of worsening AKI, GDMT limited by CKD 4 - Continue metoprolol , Imdur, decrease hydralazine  dose - Continue oral torsemide   History of A-fib History of cardiac arrest sp ICD -Continue metoprolol , amiodarone  and Xarelto   Type 2 diabetes mellitus Continue Semglee , SSI - Dose increased, meal coverage  BPH - Continue Flomax    DVT prophylaxis: Xarelto  Code Status: Full code Family Communication: None present Disposition Plan: Home likely 1 to 2 days  Consultants:    Procedures:   Antimicrobials:    Objective: Vitals:   03/21/24 0720 03/21/24 0800 03/21/24 0804 03/21/24 0815  BP: 114/69   114/69  Pulse: 64   64  Resp: 19     Temp: 97.7 F (36.5 C)     TempSrc: Oral     SpO2: 97% 98% 98%   Weight:      Height:        Intake/Output Summary (Last 24 hours) at 03/21/2024 1017 Last data filed at 03/21/2024 9277 Gross per 24 hour  Intake 676 ml  Output 1575 ml  Net -899 ml   Filed Weights   03/16/24 1522 03/17/24 1719 03/21/24 0432  Weight: 121.6 kg 123.2 kg 123.7 kg    Examination:  General exam: Obese chronically ill-appearing Respiratory system: Few scattered rhonchi  positive chest wall tenderness Cardiovascular system: S1 & S2 heard, RRR.  Abd: nondistended, soft and nontender.Normal bowel sounds heard. Central nervous system: Alert and oriented. No focal neurological deficits. Extremities: no edema Skin: No rashes Psychiatry:  Mood & affect appropriate.     Data Reviewed:   CBC: Recent Labs  Lab 03/16/24 1623 03/19/24 0330 03/20/24  0234  WBC 9.7 8.4 10.9*  HGB 12.9* 10.8* 11.1*  HCT 39.2 32.2* 32.7*  MCV 90.7 88.2 87.7  PLT 282 292 311   Basic Metabolic Panel: Recent Labs  Lab 03/16/24 1623 03/18/24 0237 03/19/24 0330 03/20/24 0234 03/21/24 0331  NA 139 137 134* 137 136  K 3.7 3.3* 3.7 3.4* 3.7  CL 90* 93* 93* 94* 96*  CO2 30 30 26 30 27   GLUCOSE 225* 168* 424* 247* 258*  BUN 39* 53* 51* 54* 58*   CREATININE 3.46* 3.48* 2.95* 2.61* 2.47*  CALCIUM  9.6 9.2 9.3 9.4 9.3  MG 1.7  --   --   --   --    GFR: Estimated Creatinine Clearance: 43.7 mL/min (A) (by C-G formula based on SCr of 2.47 mg/dL (H)). Liver Function Tests: Recent Labs  Lab 03/16/24 1623 03/19/24 0330 03/20/24 0234  AST 17 18 16   ALT 20 22 21   ALKPHOS 66 62 56  BILITOT 1.6* 0.8 0.6  PROT 8.2* 7.5 7.2  ALBUMIN  3.5 3.0* 3.0*   No results for input(s): LIPASE, AMYLASE in the last 168 hours. No results for input(s): AMMONIA in the last 168 hours. Coagulation Profile: No results for input(s): INR, PROTIME in the last 168 hours. Cardiac Enzymes: No results for input(s): CKTOTAL, CKMB, CKMBINDEX, TROPONINI in the last 168 hours. BNP (last 3 results) No results for input(s): PROBNP in the last 8760 hours. HbA1C: No results for input(s): HGBA1C in the last 72 hours. CBG: Recent Labs  Lab 03/20/24 0618 03/20/24 1136 03/20/24 1552 03/20/24 2230 03/21/24 0618  GLUCAP 226* 213* 328* 335* 181*   Lipid Profile: No results for input(s): CHOL, HDL, LDLCALC, TRIG, CHOLHDL, LDLDIRECT in the last 72 hours. Thyroid  Function Tests: No results for input(s): TSH, T4TOTAL, FREET4, T3FREE, THYROIDAB in the last 72 hours. Anemia Panel: No results for input(s): VITAMINB12, FOLATE, FERRITIN, TIBC, IRON, RETICCTPCT in the last 72 hours. Urine analysis:    Component Value Date/Time   COLORURINE STRAW (A) 10/08/2023 0952   APPEARANCEUR CLEAR 10/08/2023 0952   APPEARANCEUR Clear 07/17/2020 1655   LABSPEC 1.009 10/08/2023 0952   PHURINE 5.0 10/08/2023 0952   GLUCOSEU 50 (A) 10/08/2023 0952   HGBUR NEGATIVE 10/08/2023 0952   BILIRUBINUR NEGATIVE 10/08/2023 0952   BILIRUBINUR negative 01/30/2022 1705   BILIRUBINUR Negative 07/17/2020 1655   KETONESUR NEGATIVE 10/08/2023 0952   PROTEINUR NEGATIVE 10/08/2023 0952   UROBILINOGEN 0.2 01/30/2022 1705   UROBILINOGEN 1.0  06/20/2013 1032   NITRITE NEGATIVE 10/08/2023 0952   LEUKOCYTESUR NEGATIVE 10/08/2023 0952   Sepsis Labs: @LABRCNTIP (procalcitonin:4,lacticidven:4)  ) Recent Results (from the past 240 hours)  Resp panel by RT-PCR (RSV, Flu A&B, Covid) Anterior Nasal Swab     Status: None   Collection Time: 03/16/24  3:32 PM   Specimen: Anterior Nasal Swab  Result Value Ref Range Status   SARS Coronavirus 2 by RT PCR NEGATIVE NEGATIVE Final   Influenza A by PCR NEGATIVE NEGATIVE Final   Influenza B by PCR NEGATIVE NEGATIVE Final    Comment: (NOTE) The Xpert Xpress SARS-CoV-2/FLU/RSV plus assay is intended as an aid in the diagnosis of influenza from Nasopharyngeal swab specimens and should not be used as a sole basis for treatment. Nasal washings and aspirates are unacceptable for Xpert Xpress SARS-CoV-2/FLU/RSV testing.  Fact Sheet for Patients: BloggerCourse.com  Fact Sheet for Healthcare Providers: SeriousBroker.it  This test is not yet approved or cleared by the [place]  FDA and has been authorized for detection and/or diagnosis  of SARS-CoV-2 by FDA under an Emergency Use Authorization (EUA). This EUA will remain in effect (meaning this test can be used) for the duration of the COVID-19 declaration under Section 564(b)(1) of the Act, 21 U.S.C. section 360bbb-3(b)(1), unless the authorization is terminated or revoked.     Resp Syncytial Virus by PCR NEGATIVE NEGATIVE Final    Comment: (NOTE) Fact Sheet for Patients: BloggerCourse.com  Fact Sheet for Healthcare Providers: SeriousBroker.it  This test is not yet approved or cleared by the [place]  FDA and has been authorized for detection and/or diagnosis of SARS-CoV-2 by FDA under an Emergency Use Authorization (EUA). This EUA will remain in effect (meaning this test can be used) for the duration of the COVID-19 declaration  under Section 564(b)(1) of the Act, 21 U.S.C. section 360bbb-3(b)(1), unless the authorization is terminated or revoked.  Performed at [facility], [address] [place], [place] [address]      Radiology Studies: DG Swallowing Func-Speech Pathology Result Date: 03/21/2024 Table formatting from the original result was not included. Modified Barium Swallow Study Patient Details Name: [name] MRN: [id number] Date of Birth: Mar 30, 1966 Today's Date: 03/21/2024 HPI/PMH: HPI: 58 yo male presenting to ED 10/8 with central chest pain. Admitted with AKI and LUL pneumonia (CT shows linear infiltration or atelectasis in the lung bases and developing patchy airspace infiltrates in the LUL, likely represents developing pneumonia superimposed upon chronic fibrosis and atelectasis). Per MD note, pt reports choking when drinking recently. Seen by SLP 12/2023 with functional appearing oropharyngeal swallow and greater concern for esophageal dysphagia. PMH: chronic systolic CHF with recovered EF, NICM, OSA intolerant to CPAP, CKD 4, T2DM, gout Clinical Impression: Clinical Impression: Pt presents wtih a functional oropharyngeal swallow. He has piecemeal swallowing with purees as well as some more repetitive lingual motion, not observed with other consistencies but still functional with good oral clearance. Airway protection is complete with no aspiration and rare penetration, noted with thin liquids but clearing spontaneously (PAS 2, considered to be normal). No overt esophageal retention of barium noted during scan, but pt does endorse a h/o reflux. He believes it is more managed now, but encouraged him to continue to work on this. Esophageal and aspiration precautions were reviewed. SLP to sign off acutely. DIGEST Swallow Severity Rating*  Safety: 0  Efficiency:0  Overall Pharyngeal Swallow Severity: 0 1: mild; 2: moderate; 3: severe; 4: profound *The Dynamic Imaging Grade of Swallowing Toxicity is  standardized for the head and neck cancer population, however, demonstrates promising clinical applications across populations to standardize the clinical rating of pharyngeal swallow safety and severity. Factors that may increase risk of adverse event in presence of aspiration Noe & Lianne 2021): Factors that may increase risk of adverse event in presence of aspiration Noe & Lianne 2021): Respiratory or GI disease Recommendations/Plan: Swallowing Evaluation Recommendations Swallowing Evaluation Recommendations Recommendations: PO diet PO Diet Recommendation: Regular; Thin liquids (Level 0) Liquid Administration via: Cup; Straw Medication Administration: Whole meds with liquid Supervision: Patient able to self-feed Swallowing strategies  : Slow rate; Small bites/sips; Follow solids with liquids Postural changes: Position pt fully upright for meals; Stay upright 30-60 min after meals Oral care recommendations: Oral care BID (2x/day) Treatment Plan Treatment Plan Treatment recommendations: No treatment recommended at this time Follow-up recommendations: No SLP follow up Functional status assessment: Patient has not had a recent decline in their functional status. Recommendations Recommendations for follow up therapy are one component of a multi-disciplinary discharge planning process, led by  the attending physician.  Recommendations may be updated based on patient status, additional functional criteria and insurance authorization. Assessment: Orofacial Exam: Orofacial Exam Oral Cavity - Dentition: Adequate natural dentition Anatomy: Anatomy: Suspected cervical osteophytes Boluses Administered: Boluses Administered Boluses Administered: Thin liquids (Level 0); Mildly thick liquids (Level 2, nectar thick); Moderately thick liquids (Level 3, honey thick); Puree; Solid  Oral Impairment Domain: Oral Impairment Domain Lip Closure: No labial escape (limited visibility of lips on MBS but no outward escape noted)  Tongue control during bolus hold: Cohesive bolus between tongue to palatal seal Bolus preparation/mastication: Timely and efficient chewing and mashing Bolus transport/lingual motion: Repetitive/disorganized tongue motion Oral residue: Majority of bolus remaining Location of oral residue : Tongue Initiation of pharyngeal swallow : Posterior laryngeal surface of the epiglottis  Pharyngeal Impairment Domain: Pharyngeal Impairment Domain Soft palate elevation: No bolus between soft palate (SP)/pharyngeal wall (PW) Anterior hyoid excursion: Complete anterior movement Epiglottic movement: Complete inversion Laryngeal vestibule closure: Complete, no air/contrast in laryngeal vestibule Pharyngeal stripping wave : Present - complete Pharyngeal contraction (A/P view only): N/A Pharyngoesophageal segment opening: Complete distension and complete duration, no obstruction of flow (limited view due to shadow from shoulder, but complete clearance from pharynx noted) Tongue base retraction: No contrast between tongue base and posterior pharyngeal wall (PPW) Pharyngeal residue: Complete pharyngeal clearance Location of pharyngeal residue: N/A  Esophageal Impairment Domain: Esophageal Impairment Domain Esophageal clearance upright position: Complete clearance, esophageal coating Pill: Pill Consistency administered: Thin liquids (Level 0) Thin liquids (Level 0): [name] Penetration/Aspiration Scale Score: Penetration/Aspiration Scale Score 1.  Material does not enter airway: Mildly thick liquids (Level 2, nectar thick); Moderately thick liquids (Level 3, honey thick); Puree; Solid; Pill 2.  Material enters airway, remains ABOVE vocal cords then ejected out: Thin liquids (Level 0) Compensatory Strategies: Compensatory Strategies Compensatory strategies: No   General Information: Caregiver present: No  Diet Prior to this Study: Regular; Thin liquids (Level 0)   Temperature : Normal   Respiratory Status: WFL   Supplemental O2: Nasal cannula    History of Recent Intubation: No  Behavior/Cognition: Alert; Cooperative; Pleasant mood Self-Feeding Abilities: Needs assist with self-feeding (due to tight positioning in chair, otherwise able to self-feed) Baseline vocal quality/speech: Normal No data recorded Volitional Swallow: Able to elicit Exam Limitations: Limited visibility (shadow from shoulder) Goal Planning: Prognosis for improved oropharyngeal function: Good No data recorded No data recorded Patient/Family Stated Goal: none stated Consulted and agree with results and recommendations: Patient Pain: Pain Assessment Pain Assessment: Faces Faces Pain Scale: 0 End of Session: Start Time:SLP Start Time (ACUTE ONLY): 9173 Stop Time: SLP Stop Time (ACUTE ONLY): 9161 Time Calculation:SLP Time Calculation (min) (ACUTE ONLY): 12 min Charges: SLP Evaluations $ SLP Speech Visit: 1 Visit SLP Evaluations $MBS Swallow: 1 Procedure SLP visit diagnosis: SLP Visit Diagnosis: Dysphagia, unspecified (R13.10) Past Medical History: Past Medical History: Diagnosis Date  AICD (automatic cardioverter/defibrillator) present   Chronic bronchitis (HCC)   get it most q yr (12/23/2013)  Chronic systolic (congestive) heart failure (HCC)   Fracture of left humerus   a. 07/2013.  GERD (gastroesophageal reflux disease)   not at present time  Heart murmur   born w/it   History of renal calculi   Hypertension   Kidney stones   NICM (nonischemic cardiomyopathy) (HCC)   a. 07/2013 Echo: EF 20-25%, diff HK, Gr2 DD, mild MR, mod dil LA/RA. 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  Shockable heart rhythm detected by automated external defibrillator   Syncope   Type II diabetes mellitus (HCC) 2006 Past Surgical History: Past Surgical History: Procedure Laterality Date  CARDIAC CATHETERIZATION  08/10/13  CHOLECYSTECTOMY  ~ 2010  IMPLANTABLE CARDIOVERTER DEFIBRILLATOR IMPLANT  12/23/2013  STJ Fortify ICD implanted by Dr  Kelsie for cardiomyopathy and syncope  IMPLANTABLE CARDIOVERTER DEFIBRILLATOR IMPLANT N/A 12/23/2013  Procedure: IMPLANTABLE CARDIOVERTER DEFIBRILLATOR IMPLANT;  Surgeon: Lynwood JONETTA Kelsie, MD;  Location: [facility] CATH LAB;  Service: Cardiovascular;  Laterality: N/A;  INGUINAL HERNIA REPAIR Left 03/01/2018  Procedure: OPEN REPAIR OF LEFT INGUINAL HERNIA WITH MESH;  Surgeon: Tanda Locus, MD;  Location: [facility] ORS;  Service: General;  Laterality: Left;  LEAD REVISION/REPAIR N/A 05/15/2022  Procedure: LEAD REVISION/REPAIR;  Surgeon: [name] Ole DASEN, MD;  Location: [facility] INVASIVE CV LAB;  Service: Cardiovascular;  Laterality: N/A;  LEFT HEART CATHETERIZATION WITH CORONARY ANGIOGRAM N/A 08/10/2013  Procedure: LEFT HEART CATHETERIZATION WITH CORONARY ANGIOGRAM;  Surgeon: Candyce GORMAN Reek, MD;  Location: [facility] CATH LAB;  Service: Cardiovascular;  Laterality: N/A;  ORIF HUMERUS FRACTURE Left 08/12/2013  Procedure: OPEN REDUCTION INTERNAL FIXATION (ORIF) HUMERAL SHAFT FRACTURE;  Surgeon: Jerona LULLA Sage, MD;  Location: [facility] OR;  Service: Orthopedics;  Laterality: Left;  Open Reduction Internal Fixation Left Humerus  RIGHT/LEFT HEART CATH AND CORONARY ANGIOGRAPHY N/A 05/12/2022  Procedure: RIGHT/LEFT HEART CATH AND CORONARY ANGIOGRAPHY;  Surgeon: Gardenia Led, DO;  Location: [facility] INVASIVE CV LAB;  Service: Cardiovascular;  Laterality: N/A;  URETEROSCOPY    laser for kidney stones Leita SAILOR., M.A. CCC-SLP Acute Rehabilitation Services Office: [phone] Secure chat preferred 03/21/2024, 9:27 AM  US  RENAL Result Date: 03/19/2024 CLINICAL DATA:  [id number] AKI (acute kidney injury) [id number]. EXAM: RENAL / URINARY TRACT ULTRASOUND COMPLETE COMPARISON:  None Available. FINDINGS: Right Kidney: Renal measurements: 4.9 x 5.6 x 9.8 cm = volume: 139.5 mL. Echogenicity within normal limits. No mass or hydronephrosis visualized. Left Kidney: Renal measurements: 4.6 x 5.1 x 10.6 cm = volume: 130.7 mL. Echogenicity within normal limits. No mass or  hydronephrosis visualized. Bladder: Appears normal for degree of bladder distention. Other: None. IMPRESSION: Unremarkable renal ultrasound examination. Electronically Signed   By: Ree Molt M.D.   On: 03/19/2024 13:22     Scheduled Meds:  acetaminophen   1,000 mg Oral Once   allopurinol  100 mg Oral Daily   amiodarone   200 mg Oral Daily   amoxicillin -clavulanate  1 tablet Oral Q12H   atorvastatin   10 mg Oral Daily   fluticasone  furoate-vilanterol  1 puff Inhalation Daily   gabapentin   300 mg Oral QHS   hydrALAZINE   10 mg Oral Q8H   insulin  aspart  0-15 Units Subcutaneous TID WC   insulin  aspart  6 Units Subcutaneous TID WC   insulin  glargine  50 Units Subcutaneous Daily   isosorbide mononitrate  15 mg Oral Daily   lidocaine   1 patch Transdermal Q24H   methocarbamol   500 mg Oral TID   metoprolol  succinate  12.5 mg Oral Daily   nystatin    Topical BID   pantoprazole   40 mg Oral Daily   prednisoLONE acetate  1 drop Left Eye QHS   predniSONE   40 mg Oral Q breakfast   rivaroxaban   20 mg Oral Q supper   sertraline   75 mg Oral q AM   tamsulosin   0.8 mg Oral Daily   torsemide   20 mg Oral Daily   Continuous Infusions:     LOS: 4 days    Time spent:  Sigurd Pac, MD [facility]   03/21/2024, 10:17 AM

## 2024-03-21 NOTE — Progress Notes (Signed)
 Mobility Specialist Progress Note:    03/21/24 1210  Mobility  Activity Ambulated with assistance  Level of Assistance Contact guard assist, steadying assist  Assistive Device None  Distance Ambulated (ft) 50 ft  Activity Response Tolerated fair  Mobility Referral Yes  Mobility visit 1 Mobility  Mobility Specialist Start Time (ACUTE ONLY) 1210  Mobility Specialist Stop Time (ACUTE ONLY) 1222  Mobility Specialist Time Calculation (min) (ACUTE ONLY) 12 min   Pt pleasant sitting on EOB agreeable to session. No c/o any symptoms but pt breathing seemed somewhat labored and using rails to ambulate. Pt in good spirits moving and ambulating decently. Returned pt to room w/ all needs met.   Venetia Keel Mobility Specialist Please Neurosurgeon or Rehab Office at [phone]

## 2024-03-21 NOTE — Evaluation (Addendum)
 Modified Barium Swallow Study  Patient Details  Name: [name] MRN: [id number] Date of Birth: 01-31-1966  Today's Date: 03/21/2024  Modified Barium Swallow completed.  Full report located under Chart Review in the Imaging Section.  History of Present Illness 58 yo male presenting to ED 10/8 with central chest pain. Admitted with AKI and LUL pneumonia (CT shows linear infiltration or atelectasis in the lung bases and developing patchy airspace infiltrates in the LUL, likely represents developing pneumonia superimposed upon chronic fibrosis and atelectasis). Per MD note, pt reports choking when drinking recently. Seen by SLP 12/2023 with functional appearing oropharyngeal swallow and greater concern for esophageal dysphagia. PMH: chronic systolic CHF with recovered EF, NICM, OSA intolerant to CPAP, CKD 4, T2DM, gout   Clinical Impression Pt presents wtih a functional oropharyngeal swallow. He has piecemeal swallowing with purees as well as some more repetitive lingual motion, not observed with other consistencies but still functional with good oral clearance. Airway protection is complete with no aspiration and rare penetration, noted with thin liquids but clearing spontaneously (PAS 2, considered to be normal). No overt esophageal retention of barium noted during scan, but pt does endorse a h/o reflux. He believes it is more managed now, but encouraged him to continue to work on this. Esophageal and aspiration precautions were reviewed. SLP to sign off acutely.   DIGEST Swallow Severity Rating*  Safety: 0  Efficiency:0  Overall Pharyngeal Swallow Severity: 0 1: mild; 2: moderate; 3: severe; 4: profound  *The Dynamic Imaging Grade of Swallowing Toxicity is standardized for the head and neck cancer population, however, demonstrates promising clinical applications across populations to standardize the clinical rating of pharyngeal swallow safety and severity.  Factors that may increase risk  of adverse event in presence of aspiration Noe & Lianne 2021): Respiratory or GI disease  Swallow Evaluation Recommendations Recommendations: PO diet PO Diet Recommendation: Regular;Thin liquids (Level 0) Liquid Administration via: Cup;Straw Medication Administration: Whole meds with liquid Supervision: Patient able to self-feed Swallowing strategies  : Slow rate;Small bites/sips;Follow solids with liquids Postural changes: Position pt fully upright for meals;Stay upright 30-60 min after meals Oral care recommendations: Oral care BID (2x/day)      Leita SAILOR., M.A. CCC-SLP Acute Rehabilitation Services Office: [phone]  Secure chat preferred  03/21/2024,9:26 AM

## 2024-03-22 DIAGNOSIS — N179 Acute kidney failure, unspecified: Secondary | ICD-10-CM | POA: Diagnosis not present

## 2024-03-22 LAB — BASIC METABOLIC PANEL WITH GFR
Anion gap: 13 (ref 5–15)
BUN: 62 mg/dL — ABNORMAL HIGH (ref 6–20)
CO2: 28 mmol/L (ref 22–32)
Calcium: 9.3 mg/dL (ref 8.9–10.3)
Chloride: 94 mmol/L — ABNORMAL LOW (ref 98–111)
Creatinine, Ser: 2.64 mg/dL — ABNORMAL HIGH (ref 0.61–1.24)
GFR, Estimated: 27 mL/min — ABNORMAL LOW (ref >60)
Glucose, Bld: 228 mg/dL — ABNORMAL HIGH (ref 70–99)
Potassium: 3.5 mmol/L (ref 3.5–5.1)
Sodium: 135 mmol/L (ref 135–145)

## 2024-03-22 LAB — GLUCOSE, CAPILLARY
Glucose-Capillary: 177 mg/dL — ABNORMAL HIGH (ref 70–99)
Glucose-Capillary: 209 mg/dL — ABNORMAL HIGH (ref 70–99)
Glucose-Capillary: 365 mg/dL — ABNORMAL HIGH (ref 70–99)
Glucose-Capillary: 341 mg/dL — ABNORMAL HIGH (ref 70–99)

## 2024-03-22 MED ORDER — FUROSEMIDE 10 MG/ML IJ SOLN
80.0000 mg | Freq: Two times a day (BID) | INTRAMUSCULAR | Status: AC
  Administered 2024-03-22 (×2): 80 mg via INTRAVENOUS
  Filled 2024-03-22 (×2): qty 8

## 2024-03-22 MED ORDER — POTASSIUM CHLORIDE CRYS ER 20 MEQ PO TBCR
40.0000 meq | EXTENDED_RELEASE_TABLET | Freq: Once | ORAL | Status: AC
  Administered 2024-03-22: 40 meq via ORAL
  Filled 2024-03-22: qty 2

## 2024-03-22 NOTE — Progress Notes (Signed)
 PROGRESS NOTE    [name]  FMW:[id number] DOB: April 23, 1966 DOA: 03/16/2024 PCP: Vicci Barnie NOVAK, MD  58/M with chronic systolic CHF with recovered EF, NICM, OSA intolerant to CPAP, CKD 4, type 2 diabetes mellitus, gout.  Has been admitted multiple times with CHF or kidney failure. - Recently admitted 9/25 after syncopal episode with polymorphic VT/VF terminated with ATP, loaded with amiodarone , no further VT transition to p.o. - Presented to the ED 10/9 with central chest pain, also reports choking on a drink a week ago, he took some Advil thereafter for pain relief despite CKD. - In the ED vital signs were stable, labs noted creatinine of 3.4, up from baseline of 2.3-2.6, CT chest noted developing patchy airspace infiltrates in the left upper lung on the background of chronic fibrosis and atelectasis. - Started on IV fluids for AKI, antibiotics and admitted - Chest wall and shoulder pains, ESR and uric acid significantly elevated, started on IV steroids again  Subjective: - Complains of some dyspnea worsening overnight, mild orthopnea  Assessment and Plan:  AKI on CKD 4 - Etiology unclear, likely cardiorenal, monitor for retention - Improving, creatinine almost back to baseline, - Restarting IV Lasix  today  Left upper lobe pneumonia - Treated with IV ceftriaxone  and azithromycin ,, switch to oral ABX to complete 5-day course - Wean O2 off - SLP following, MBS completed, no concerns for overt dysphagia  Chest wall pain, shoulder pain -Musculoskeletal, tender on palpation - Similar presentation 3 months ago, had gout in multiple joint spaces including costochondral - ESR is 90 and uric acid is 15.2, 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 - Improving on IV Solu-Medrol , switch to oral prednisone  , continue allopurinol - Advised limiting animal protein  Chronic systolic CHF - Last echo with a EF 45-50%, normal RV Clinically appears euvolemic now, initially held torsemide   in the setting of worsening AKI, GDMT limited by CKD 4 - Continue metoprolol , Imdur, decrease hydralazine  dose - Continue oral torsemide   History of A-fib History of cardiac arrest sp ICD -Continue metoprolol , amiodarone  and Xarelto   Type 2 diabetes mellitus Continue Semglee , SSI - Dose increased, meal coverage  BPH - Continue Flomax    DVT prophylaxis: Xarelto  Code Status: Full code Family Communication: None present Disposition Plan: Home likely 1 to 2 days  Consultants:    Procedures:   Antimicrobials:    Objective: Vitals:   03/22/24 0014 03/22/24 0311 03/22/24 0614 03/22/24 0740  BP: 110/64 124/65 124/65   Pulse: 68 64  63  Resp: 18 20    Temp: 97.9 F (36.6 C) 97.8 F (36.6 C)    TempSrc: Oral Oral  Oral  SpO2: 90% 92%  100%  Weight:      Height:        Intake/Output Summary (Last 24 hours) at 03/22/2024 1016 Last data filed at 03/22/2024 0924 Gross per 24 hour  Intake 360 ml  Output 550 ml  Net -190 ml   Filed Weights   03/16/24 1522 03/17/24 1719 03/21/24 0432  Weight: 121.6 kg 123.2 kg 123.7 kg    Examination:  General exam: Obese chronically ill-appearing, AO x 3 Respiratory system: Few scattered rhonchi, decreased at the bases  positive chest wall tenderness Cardiovascular system: S1 & S2 heard, RRR.  Abd: nondistended, soft and nontender.Normal bowel sounds heard. Central nervous system: Alert and oriented. No focal neurological deficits. Extremities: no edema Skin: No rashes Psychiatry:  Mood & affect appropriate.     Data Reviewed:   CBC:  Recent Labs  Lab 03/16/24 1623 03/19/24 0330 03/20/24 0234  WBC 9.7 8.4 10.9*  HGB 12.9* 10.8* 11.1*  HCT 39.2 32.2* 32.7*  MCV 90.7 88.2 87.7  PLT 282 292 311   Basic Metabolic Panel: Recent Labs  Lab 03/16/24 1623 03/18/24 0237 03/19/24 0330 03/20/24 0234 03/21/24 0331 03/22/24 0256  NA 139 137 134* 137 136 135  K 3.7 3.3* 3.7 3.4* 3.7 3.5  CL 90* 93* 93* 94* 96* 94*  CO2  30 30 26 30 27 28   GLUCOSE 225* 168* 424* 247* 258* 228*  BUN 39* 53* 51* 54* 58* 62*  CREATININE 3.46* 3.48* 2.95* 2.61* 2.47* 2.64*  CALCIUM  9.6 9.2 9.3 9.4 9.3 9.3  MG 1.7  --   --   --   --   --    GFR: Estimated Creatinine Clearance: 40.9 mL/min (A) (by C-G formula based on SCr of 2.64 mg/dL (H)). Liver Function Tests: Recent Labs  Lab 03/16/24 1623 03/19/24 0330 03/20/24 0234  AST 17 18 16   ALT 20 22 21   ALKPHOS 66 62 56  BILITOT 1.6* 0.8 0.6  PROT 8.2* 7.5 7.2  ALBUMIN  3.5 3.0* 3.0*   No results for input(s): LIPASE, AMYLASE in the last 168 hours. No results for input(s): AMMONIA in the last 168 hours. Coagulation Profile: No results for input(s): INR, PROTIME in the last 168 hours. Cardiac Enzymes: No results for input(s): CKTOTAL, CKMB, CKMBINDEX, TROPONINI in the last 168 hours. BNP (last 3 results) No results for input(s): PROBNP in the last 8760 hours. HbA1C: No results for input(s): HGBA1C in the last 72 hours. CBG: Recent Labs  Lab 03/21/24 0618 03/21/24 1117 03/21/24 1628 03/21/24 2118 03/22/24 0553  GLUCAP 181* 266* 391* 351* 177*   Lipid Profile: No results for input(s): CHOL, HDL, LDLCALC, TRIG, CHOLHDL, LDLDIRECT in the last 72 hours. Thyroid  Function Tests: No results for input(s): TSH, T4TOTAL, FREET4, T3FREE, THYROIDAB in the last 72 hours. Anemia Panel: No results for input(s): VITAMINB12, FOLATE, FERRITIN, TIBC, IRON, RETICCTPCT in the last 72 hours. Urine analysis:    Component Value Date/Time   COLORURINE STRAW (A) 10/08/2023 0952   APPEARANCEUR CLEAR 10/08/2023 0952   APPEARANCEUR Clear 07/17/2020 1655   LABSPEC 1.009 10/08/2023 0952   PHURINE 5.0 10/08/2023 0952   GLUCOSEU 50 (A) 10/08/2023 0952   HGBUR NEGATIVE 10/08/2023 0952   BILIRUBINUR NEGATIVE 10/08/2023 0952   BILIRUBINUR negative 01/30/2022 1705   BILIRUBINUR Negative 07/17/2020 1655   KETONESUR NEGATIVE 10/08/2023  0952   PROTEINUR NEGATIVE 10/08/2023 0952   UROBILINOGEN 0.2 01/30/2022 1705   UROBILINOGEN 1.0 06/20/2013 1032   NITRITE NEGATIVE 10/08/2023 0952   LEUKOCYTESUR NEGATIVE 10/08/2023 0952   Sepsis Labs: @LABRCNTIP (procalcitonin:4,lacticidven:4)  ) Recent Results (from the past 240 hours)  Resp panel by RT-PCR (RSV, Flu A&B, Covid) Anterior Nasal Swab     Status: None   Collection Time: 03/16/24  3:32 PM   Specimen: Anterior Nasal Swab  Result Value Ref Range Status   SARS Coronavirus 2 by RT PCR NEGATIVE NEGATIVE Final   Influenza A by PCR NEGATIVE NEGATIVE Final   Influenza B by PCR NEGATIVE NEGATIVE Final    Comment: (NOTE) The Xpert Xpress SARS-CoV-2/FLU/RSV plus assay is intended as an aid in the diagnosis of influenza from Nasopharyngeal swab specimens and should not be used as a sole basis for treatment. Nasal washings and aspirates are unacceptable for Xpert Xpress SARS-CoV-2/FLU/RSV testing.  Fact Sheet for Patients: BloggerCourse.com  Fact Sheet for Healthcare Providers: SeriousBroker.it  This test is not yet approved or cleared by the [place]  FDA and has been authorized for detection and/or diagnosis of SARS-CoV-2 by FDA under an Emergency Use Authorization (EUA). This EUA will remain in effect (meaning this test can be used) for the duration of the COVID-19 declaration under Section 564(b)(1) of the Act, 21 U.S.C. section 360bbb-3(b)(1), unless the authorization is terminated or revoked.     Resp Syncytial Virus by PCR NEGATIVE NEGATIVE Final    Comment: (NOTE) Fact Sheet for Patients: BloggerCourse.com  Fact Sheet for Healthcare Providers: SeriousBroker.it  This test is not yet approved or cleared by the [place]  FDA and has been authorized for detection and/or diagnosis of SARS-CoV-2 by FDA under an Emergency Use Authorization (EUA). This EUA will  remain in effect (meaning this test can be used) for the duration of the COVID-19 declaration under Section 564(b)(1) of the Act, 21 U.S.C. section 360bbb-3(b)(1), unless the authorization is terminated or revoked.  Performed at [facility], [address] [place], [place] [address]      Radiology Studies: DG Swallowing Func-Speech Pathology Result Date: 03/21/2024 Table formatting from the original result was not included. Modified Barium Swallow Study Patient Details Name: [name] MRN: [id number] Date of Birth: Apr 17, 1966 Today's Date: 03/21/2024 HPI/PMH: HPI: 58 yo male presenting to ED 10/8 with central chest pain. Admitted with AKI and LUL pneumonia (CT shows linear infiltration or atelectasis in the lung bases and developing patchy airspace infiltrates in the LUL, likely represents developing pneumonia superimposed upon chronic fibrosis and atelectasis). Per MD note, pt reports choking when drinking recently. Seen by SLP 12/2023 with functional appearing oropharyngeal swallow and greater concern for esophageal dysphagia. PMH: chronic systolic CHF with recovered EF, NICM, OSA intolerant to CPAP, CKD 4, T2DM, gout Clinical Impression: Clinical Impression: Pt presents wtih a functional oropharyngeal swallow. He has piecemeal swallowing with purees as well as some more repetitive lingual motion, not observed with other consistencies but still functional with good oral clearance. Airway protection is complete with no aspiration and rare penetration, noted with thin liquids but clearing spontaneously (PAS 2, considered to be normal). No overt esophageal retention of barium noted during scan, but pt does endorse a h/o reflux. He believes it is more managed now, but encouraged him to continue to work on this. Esophageal and aspiration precautions were reviewed. SLP to sign off acutely. DIGEST Swallow Severity Rating*  Safety: 0  Efficiency:0  Overall Pharyngeal Swallow Severity: 0 1: mild; 2:  moderate; 3: severe; 4: profound *The Dynamic Imaging Grade of Swallowing Toxicity is standardized for the head and neck cancer population, however, demonstrates promising clinical applications across populations to standardize the clinical rating of pharyngeal swallow safety and severity. Factors that may increase risk of adverse event in presence of aspiration Noe & Lianne 2021): Factors that may increase risk of adverse event in presence of aspiration Noe & Lianne 2021): Respiratory or GI disease Recommendations/Plan: Swallowing Evaluation Recommendations Swallowing Evaluation Recommendations Recommendations: PO diet PO Diet Recommendation: Regular; Thin liquids (Level 0) Liquid Administration via: Cup; Straw Medication Administration: Whole meds with liquid Supervision: Patient able to self-feed Swallowing strategies  : Slow rate; Small bites/sips; Follow solids with liquids Postural changes: Position pt fully upright for meals; Stay upright 30-60 min after meals Oral care recommendations: Oral care BID (2x/day) Treatment Plan Treatment Plan Treatment recommendations: No treatment recommended at this time Follow-up recommendations: No SLP follow up Functional status assessment: Patient has not had a recent decline  in their functional status. Recommendations Recommendations for follow up therapy are one component of a multi-disciplinary discharge planning process, led by the attending physician.  Recommendations may be updated based on patient status, additional functional criteria and insurance authorization. Assessment: Orofacial Exam: Orofacial Exam Oral Cavity - Dentition: Adequate natural dentition Anatomy: Anatomy: Suspected cervical osteophytes Boluses Administered: Boluses Administered Boluses Administered: Thin liquids (Level 0); Mildly thick liquids (Level 2, nectar thick); Moderately thick liquids (Level 3, honey thick); Puree; Solid  Oral Impairment Domain: Oral Impairment Domain Lip  Closure: No labial escape (limited visibility of lips on MBS but no outward escape noted) Tongue control during bolus hold: Cohesive bolus between tongue to palatal seal Bolus preparation/mastication: Timely and efficient chewing and mashing Bolus transport/lingual motion: Repetitive/disorganized tongue motion Oral residue: Majority of bolus remaining Location of oral residue : Tongue Initiation of pharyngeal swallow : Posterior laryngeal surface of the epiglottis  Pharyngeal Impairment Domain: Pharyngeal Impairment Domain Soft palate elevation: No bolus between soft palate (SP)/pharyngeal wall (PW) Anterior hyoid excursion: Complete anterior movement Epiglottic movement: Complete inversion Laryngeal vestibule closure: Complete, no air/contrast in laryngeal vestibule Pharyngeal stripping wave : Present - complete Pharyngeal contraction (A/P view only): N/A Pharyngoesophageal segment opening: Complete distension and complete duration, no obstruction of flow (limited view due to shadow from shoulder, but complete clearance from pharynx noted) Tongue base retraction: No contrast between tongue base and posterior pharyngeal wall (PPW) Pharyngeal residue: Complete pharyngeal clearance Location of pharyngeal residue: N/A  Esophageal Impairment Domain: Esophageal Impairment Domain Esophageal clearance upright position: Complete clearance, esophageal coating Pill: Pill Consistency administered: Thin liquids (Level 0) Thin liquids (Level 0): [facility] Penetration/Aspiration Scale Score: Penetration/Aspiration Scale Score 1.  Material does not enter airway: Mildly thick liquids (Level 2, nectar thick); Moderately thick liquids (Level 3, honey thick); Puree; Solid; Pill 2.  Material enters airway, remains ABOVE vocal cords then ejected out: Thin liquids (Level 0) Compensatory Strategies: Compensatory Strategies Compensatory strategies: No   General Information: Caregiver present: No  Diet Prior to this Study: Regular; Thin liquids  (Level 0)   Temperature : Normal   Respiratory Status: WFL   Supplemental O2: Nasal cannula   History of Recent Intubation: No  Behavior/Cognition: Alert; Cooperative; Pleasant mood Self-Feeding Abilities: Needs assist with self-feeding (due to tight positioning in chair, otherwise able to self-feed) Baseline vocal quality/speech: Normal No data recorded Volitional Swallow: Able to elicit Exam Limitations: Limited visibility (shadow from shoulder) Goal Planning: Prognosis for improved oropharyngeal function: Good No data recorded No data recorded Patient/Family Stated Goal: none stated Consulted and agree with results and recommendations: Patient Pain: Pain Assessment Pain Assessment: Faces Faces Pain Scale: 0 End of Session: Start Time:SLP Start Time (ACUTE ONLY): 9173 Stop Time: SLP Stop Time (ACUTE ONLY): 9161 Time Calculation:SLP Time Calculation (min) (ACUTE ONLY): 12 min Charges: SLP Evaluations $ SLP Speech Visit: 1 Visit SLP Evaluations $MBS Swallow: 1 Procedure SLP visit diagnosis: SLP Visit Diagnosis: Dysphagia, unspecified (R13.10) Past Medical History: Past Medical History: Diagnosis Date  AICD (automatic cardioverter/defibrillator) present   Chronic bronchitis (HCC)   get it most q yr (12/23/2013)  Chronic systolic (congestive) heart failure (HCC)   Fracture of left humerus   a. 07/2013.  GERD (gastroesophageal reflux disease)   not at present time  Heart murmur   born w/it   History of renal calculi   Hypertension   Kidney stones   NICM (nonischemic cardiomyopathy) (HCC)   a. 07/2013 Echo: EF 20-25%, diff HK, Gr2 DD, mild MR, mod dil  LA/RA. 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   Shockable heart rhythm detected by automated external defibrillator   Syncope   Type II diabetes mellitus (HCC) 2006 Past Surgical History: Past Surgical History: Procedure Laterality Date  CARDIAC CATHETERIZATION  08/10/13  CHOLECYSTECTOMY  ~ 2010   IMPLANTABLE CARDIOVERTER DEFIBRILLATOR IMPLANT  12/23/2013  STJ Fortify ICD implanted by Dr Kelsie for cardiomyopathy and syncope  IMPLANTABLE CARDIOVERTER DEFIBRILLATOR IMPLANT N/A 12/23/2013  Procedure: IMPLANTABLE CARDIOVERTER DEFIBRILLATOR IMPLANT;  Surgeon: Lynwood JONETTA Kelsie, MD;  Location: [facility] CATH LAB;  Service: Cardiovascular;  Laterality: N/A;  INGUINAL HERNIA REPAIR Left 03/01/2018  Procedure: OPEN REPAIR OF LEFT INGUINAL HERNIA WITH MESH;  Surgeon: Tanda Locus, MD;  Location: [facility] ORS;  Service: General;  Laterality: Left;  LEAD REVISION/REPAIR N/A 05/15/2022  Procedure: LEAD REVISION/REPAIR;  Surgeon: [name] Ole DASEN, MD;  Location: [facility] INVASIVE CV LAB;  Service: Cardiovascular;  Laterality: N/A;  LEFT HEART CATHETERIZATION WITH CORONARY ANGIOGRAM N/A 08/10/2013  Procedure: LEFT HEART CATHETERIZATION WITH CORONARY ANGIOGRAM;  Surgeon: Candyce GORMAN Reek, MD;  Location: [facility] CATH LAB;  Service: Cardiovascular;  Laterality: N/A;  ORIF HUMERUS FRACTURE Left 08/12/2013  Procedure: OPEN REDUCTION INTERNAL FIXATION (ORIF) HUMERAL SHAFT FRACTURE;  Surgeon: Jerona LULLA Sage, MD;  Location: [facility] OR;  Service: Orthopedics;  Laterality: Left;  Open Reduction Internal Fixation Left Humerus  RIGHT/LEFT HEART CATH AND CORONARY ANGIOGRAPHY N/A 05/12/2022  Procedure: RIGHT/LEFT HEART CATH AND CORONARY ANGIOGRAPHY;  Surgeon: Gardenia Led, DO;  Location: [facility] INVASIVE CV LAB;  Service: Cardiovascular;  Laterality: N/A;  URETEROSCOPY    laser for kidney stones Leita SAILOR., M.A. CCC-SLP Acute Rehabilitation Services Office: [phone] Secure chat preferred 03/21/2024, 9:27 AM    Scheduled Meds:  acetaminophen   1,000 mg Oral Once   allopurinol  100 mg Oral Daily   amiodarone   200 mg Oral Daily   amoxicillin -clavulanate  1 tablet Oral Q12H   atorvastatin   10 mg Oral Daily   fluticasone  furoate-vilanterol  1 puff Inhalation Daily   furosemide   80 mg Intravenous BID   gabapentin   300 mg Oral QHS   hydrALAZINE   10 mg Oral Q8H    insulin  aspart  0-15 Units Subcutaneous TID WC   insulin  aspart  6 Units Subcutaneous TID WC   insulin  glargine  50 Units Subcutaneous Daily   isosorbide mononitrate  15 mg Oral Daily   lidocaine   1 patch Transdermal Q24H   methocarbamol   500 mg Oral TID   metoprolol  succinate  12.5 mg Oral Daily   nystatin    Topical BID   pantoprazole   40 mg Oral Daily   prednisoLONE acetate  1 drop Left Eye QHS   predniSONE   40 mg Oral Q breakfast   rivaroxaban   20 mg Oral Q supper   sertraline   75 mg Oral q AM   tamsulosin   0.8 mg Oral Daily   Continuous Infusions:     LOS: 5 days    Time spent:    Sigurd Pac, MD [facility]   03/22/2024, 10:16 AM

## 2024-03-22 NOTE — Plan of Care (Signed)

## 2024-03-22 NOTE — Progress Notes (Signed)
 Mobility Specialist Progress Note:    03/22/24 1429  Mobility  Activity Dangled on edge of bed (Ankle Pumps, Heel slides, Leg lifts, Leg ext)  Level of Assistance Standby assist, set-up cues, supervision of patient - no hands on  Assistive Device None  Range of Motion/Exercises Left leg;Right leg  Activity Response Tolerated fair  Mobility Referral Yes  Mobility visit 1 Mobility  Mobility Specialist Start Time (ACUTE ONLY) 1429  Mobility Specialist Stop Time (ACUTE ONLY) 1435  Mobility Specialist Time Calculation (min) (ACUTE ONLY) 6 min   Pt not feeling too well to ambulate (pt had prior aspiration concern)but willing to do some exercises on EOB. C/o fatigue and not feeling too well after event. Pt able to perform movements with no issues. Returned pt to bed w/ all needs met.   Venetia Keel Mobility Specialist Please Neurosurgeon or Rehab Office at [phone]

## 2024-03-23 DIAGNOSIS — N179 Acute kidney failure, unspecified: Secondary | ICD-10-CM | POA: Diagnosis not present

## 2024-03-23 LAB — GLUCOSE, CAPILLARY
Glucose-Capillary: 241 mg/dL — ABNORMAL HIGH (ref 70–99)
Glucose-Capillary: 151 mg/dL — ABNORMAL HIGH (ref 70–99)
Glucose-Capillary: 172 mg/dL — ABNORMAL HIGH (ref 70–99)
Glucose-Capillary: 375 mg/dL — ABNORMAL HIGH (ref 70–99)
Glucose-Capillary: 284 mg/dL — ABNORMAL HIGH (ref 70–99)

## 2024-03-23 LAB — BASIC METABOLIC PANEL WITH GFR
Anion gap: 11 (ref 5–15)
BUN: 60 mg/dL — ABNORMAL HIGH (ref 6–20)
CO2: 31 mmol/L (ref 22–32)
Calcium: 9 mg/dL (ref 8.9–10.3)
Chloride: 95 mmol/L — ABNORMAL LOW (ref 98–111)
Creatinine, Ser: 2.58 mg/dL — ABNORMAL HIGH (ref 0.61–1.24)
GFR, Estimated: 28 mL/min — ABNORMAL LOW (ref >60)
Glucose, Bld: 214 mg/dL — ABNORMAL HIGH (ref 70–99)
Potassium: 3.3 mmol/L — ABNORMAL LOW (ref 3.5–5.1)
Sodium: 137 mmol/L (ref 135–145)

## 2024-03-23 MED ORDER — POTASSIUM CHLORIDE CRYS ER 20 MEQ PO TBCR
40.0000 meq | EXTENDED_RELEASE_TABLET | Freq: Two times a day (BID) | ORAL | Status: AC
  Administered 2024-03-23 (×2): 40 meq via ORAL
  Filled 2024-03-23 (×2): qty 2

## 2024-03-23 MED ORDER — FUROSEMIDE 10 MG/ML IJ SOLN
80.0000 mg | Freq: Two times a day (BID) | INTRAMUSCULAR | Status: AC
  Administered 2024-03-23 (×2): 80 mg via INTRAVENOUS
  Filled 2024-03-23 (×2): qty 8

## 2024-03-23 MED ORDER — INSULIN GLARGINE-YFGN 100 UNIT/ML ~~LOC~~ SOLN
50.0000 [IU] | Freq: Every day | SUBCUTANEOUS | Status: DC
  Administered 2024-03-24: 50 [IU] via SUBCUTANEOUS
  Filled 2024-03-23: qty 0.5

## 2024-03-23 MED ORDER — INSULIN ASPART 100 UNIT/ML IJ SOLN
8.0000 [IU] | Freq: Three times a day (TID) | INTRAMUSCULAR | Status: DC
  Administered 2024-03-23 – 2024-03-24 (×4): 8 [IU] via SUBCUTANEOUS

## 2024-03-23 NOTE — Plan of Care (Signed)

## 2024-03-23 NOTE — Progress Notes (Signed)
 PROGRESS NOTE    [name]  FMW:[id number] DOB: 1966-01-05 DOA: 03/16/2024 PCP: Vicci Barnie NOVAK, MD  58/M with chronic systolic CHF with recovered EF, NICM, OSA intolerant to CPAP, CKD 4, type 2 diabetes mellitus, gout.  Has been admitted multiple times with CHF or kidney failure. - Recently admitted 9/25 after syncopal episode with polymorphic VT/VF terminated with ATP, loaded with amiodarone , no further VT transition to p.o. - Presented to the ED 10/9 with central chest pain, also reports choking on a drink a week ago, he took some Advil thereafter for pain relief despite CKD. - In the ED vital signs were stable, labs noted creatinine of 3.4, up from baseline of 2.3-2.6, CT chest noted developing patchy airspace infiltrates in the left upper lung on the background of chronic fibrosis and atelectasis. - Started on IV fluids for AKI, antibiotics and admitted - Chest wall and shoulder pains, ESR and uric acid significantly elevated, started on IV steroids again  Subjective: - Complains of some dyspnea worsening overnight, mild orthopnea  Assessment and Plan:  AKI on CKD 4 - Etiology unclear, likely cardiorenal, monitor for retention - Improving, creatinine now back to baseline - Continue IV Lasix  1 more day, resume torsemide  tomorrow  Left upper lobe pneumonia - Treated with IV ceftriaxone  and azithromycin ,, switch to oral ABX to complete 5-day course, antibiotics completed today - Wean O2 off - SLP following, MBS completed, no concerns for overt dysphagia  Chest wall pain, shoulder pain -Musculoskeletal, tender on palpation - Similar presentation 3 months ago, had gout in multiple joint spaces including costochondral - ESR is 90 and uric acid is 15.2, 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 - Improvedon IV Solu-Medrol , switch to oral prednisone  , continue allopurinol - Advised limiting animal protein  Acute on chronic systolic CHF - Last echo with a EF 45-50%, normal  RV Clinically appears euvolemic now, initially held torsemide  in the setting of worsening AKI, GDMT limited by CKD 4 - Continue metoprolol , Imdur, decrease hydralazine  dose - Continue IV Lasix  1 more day, resume torsemide  tomorrow  History of A-fib History of cardiac arrest sp ICD -Continue metoprolol , amiodarone  and Xarelto   Type 2 diabetes mellitus Continue Semglee , SSI - Dose increased, meal coverage  BPH - Continue Flomax    DVT prophylaxis: Xarelto  Code Status: Full code Family Communication: None present Disposition Plan: Home likely 1 to 2 days  Consultants:    Procedures:   Antimicrobials:    Objective: Vitals:   03/22/24 2056 03/23/24 0127 03/23/24 0300 03/23/24 0745  BP: 126/75 109/61 114/68 113/71  Pulse: 64 64 64 67  Resp: 14 16 18 15   Temp: 97.6 F (36.4 C) 97.6 F (36.4 C) 97.6 F (36.4 C) (!) 97.4 F (36.3 C)  TempSrc: Oral Oral Oral Oral  SpO2: 97% 96% 96% 95%  Weight:   123 kg   Height:        Intake/Output Summary (Last 24 hours) at 03/23/2024 1123 Last data filed at 03/23/2024 0918 Gross per 24 hour  Intake 360 ml  Output 2550 ml  Net -2190 ml   Filed Weights   03/17/24 1719 03/21/24 0432 03/23/24 0300  Weight: 123.2 kg 123.7 kg 123 kg    Examination:  General exam: Obese chronically ill-appearing, AO x 3 Respiratory system: Few scattered rhonchi, decreased at the bases  positive chest wall tenderness Cardiovascular system: S1 & S2 heard, RRR.  Abd: nondistended, soft and nontender.Normal bowel sounds heard. Central nervous system: Alert and oriented. No focal  neurological deficits. Extremities: no edema Skin: No rashes Psychiatry:  Mood & affect appropriate.     Data Reviewed:   CBC: Recent Labs  Lab 03/16/24 1623 03/19/24 0330 03/20/24 0234  WBC 9.7 8.4 10.9*  HGB 12.9* 10.8* 11.1*  HCT 39.2 32.2* 32.7*  MCV 90.7 88.2 87.7  PLT 282 292 311   Basic Metabolic Panel: Recent Labs  Lab 03/16/24 1623  03/18/24 0237 03/19/24 0330 03/20/24 0234 03/21/24 0331 03/22/24 0256 03/23/24 0247  NA 139   < > 134* 137 136 135 137  K 3.7   < > 3.7 3.4* 3.7 3.5 3.3*  CL 90*   < > 93* 94* 96* 94* 95*  CO2 30   < > 26 30 27 28 31   GLUCOSE 225*   < > 424* 247* 258* 228* 214*  BUN 39*   < > 51* 54* 58* 62* 60*  CREATININE 3.46*   < > 2.95* 2.61* 2.47* 2.64* 2.58*  CALCIUM  9.6   < > 9.3 9.4 9.3 9.3 9.0  MG 1.7  --   --   --   --   --   --    < > = values in this interval not displayed.   GFR: Estimated Creatinine Clearance: 41.8 mL/min (A) (by C-G formula based on SCr of 2.58 mg/dL (H)). Liver Function Tests: Recent Labs  Lab 03/16/24 1623 03/19/24 0330 03/20/24 0234  AST 17 18 16   ALT 20 22 21   ALKPHOS 66 62 56  BILITOT 1.6* 0.8 0.6  PROT 8.2* 7.5 7.2  ALBUMIN  3.5 3.0* 3.0*   No results for input(s): LIPASE, AMYLASE in the last 168 hours. No results for input(s): AMMONIA in the last 168 hours. Coagulation Profile: No results for input(s): INR, PROTIME in the last 168 hours. Cardiac Enzymes: No results for input(s): CKTOTAL, CKMB, CKMBINDEX, TROPONINI in the last 168 hours. BNP (last 3 results) No results for input(s): PROBNP in the last 8760 hours. HbA1C: No results for input(s): HGBA1C in the last 72 hours. CBG: Recent Labs  Lab 03/22/24 1131 03/22/24 1535 03/22/24 2225 03/23/24 0124 03/23/24 0601  GLUCAP 209* 365* 341* 241* 151*   Lipid Profile: No results for input(s): CHOL, HDL, LDLCALC, TRIG, CHOLHDL, LDLDIRECT in the last 72 hours. Thyroid  Function Tests: No results for input(s): TSH, T4TOTAL, FREET4, T3FREE, THYROIDAB in the last 72 hours. Anemia Panel: No results for input(s): VITAMINB12, FOLATE, FERRITIN, TIBC, IRON, RETICCTPCT in the last 72 hours. Urine analysis:    Component Value Date/Time   COLORURINE STRAW (A) 10/08/2023 0952   APPEARANCEUR CLEAR 10/08/2023 0952   APPEARANCEUR Clear 07/17/2020 1655    LABSPEC 1.009 10/08/2023 0952   PHURINE 5.0 10/08/2023 0952   GLUCOSEU 50 (A) 10/08/2023 0952   HGBUR NEGATIVE 10/08/2023 0952   BILIRUBINUR NEGATIVE 10/08/2023 0952   BILIRUBINUR negative 01/30/2022 1705   BILIRUBINUR Negative 07/17/2020 1655   KETONESUR NEGATIVE 10/08/2023 0952   PROTEINUR NEGATIVE 10/08/2023 0952   UROBILINOGEN 0.2 01/30/2022 1705   UROBILINOGEN 1.0 06/20/2013 1032   NITRITE NEGATIVE 10/08/2023 0952   LEUKOCYTESUR NEGATIVE 10/08/2023 0952   Sepsis Labs: @LABRCNTIP (procalcitonin:4,lacticidven:4)  ) Recent Results (from the past 240 hours)  Resp panel by RT-PCR (RSV, Flu A&B, Covid) Anterior Nasal Swab     Status: None   Collection Time: 03/16/24  3:32 PM   Specimen: Anterior Nasal Swab  Result Value Ref Range Status   SARS Coronavirus 2 by RT PCR NEGATIVE NEGATIVE Final   Influenza A by PCR NEGATIVE  NEGATIVE Final   Influenza B by PCR NEGATIVE NEGATIVE Final    Comment: (NOTE) The Xpert Xpress SARS-CoV-2/FLU/RSV plus assay is intended as an aid in the diagnosis of influenza from Nasopharyngeal swab specimens and should not be used as a sole basis for treatment. Nasal washings and aspirates are unacceptable for Xpert Xpress SARS-CoV-2/FLU/RSV testing.  Fact Sheet for Patients: BloggerCourse.com  Fact Sheet for Healthcare Providers: SeriousBroker.it  This test is not yet approved or cleared by the [place]  FDA and has been authorized for detection and/or diagnosis of SARS-CoV-2 by FDA under an Emergency Use Authorization (EUA). This EUA will remain in effect (meaning this test can be used) for the duration of the COVID-19 declaration under Section 564(b)(1) of the Act, 21 U.S.C. section 360bbb-3(b)(1), unless the authorization is terminated or revoked.     Resp Syncytial Virus by PCR NEGATIVE NEGATIVE Final    Comment: (NOTE) Fact Sheet for  Patients: BloggerCourse.com  Fact Sheet for Healthcare Providers: SeriousBroker.it  This test is not yet approved or cleared by the [place]  FDA and has been authorized for detection and/or diagnosis of SARS-CoV-2 by FDA under an Emergency Use Authorization (EUA). This EUA will remain in effect (meaning this test can be used) for the duration of the COVID-19 declaration under Section 564(b)(1) of the Act, 21 U.S.C. section 360bbb-3(b)(1), unless the authorization is terminated or revoked.  Performed at [facility], [address], [place], [place] [address]      Radiology Studies: No results found.    Scheduled Meds:  acetaminophen   1,000 mg Oral Once   allopurinol  100 mg Oral Daily   amiodarone   200 mg Oral Daily   atorvastatin   10 mg Oral Daily   fluticasone  furoate-vilanterol  1 puff Inhalation Daily   furosemide   80 mg Intravenous BID   gabapentin   300 mg Oral QHS   hydrALAZINE   10 mg Oral Q8H   insulin  aspart  0-15 Units Subcutaneous TID WC   insulin  aspart  8 Units Subcutaneous TID WC   insulin  glargine  50 Units Subcutaneous Daily   isosorbide mononitrate  15 mg Oral Daily   lidocaine   1 patch Transdermal Q24H   methocarbamol   500 mg Oral TID   metoprolol  succinate  12.5 mg Oral Daily   nystatin    Topical BID   pantoprazole   40 mg Oral Daily   potassium chloride   40 mEq Oral BID   prednisoLONE acetate  1 drop Left Eye QHS   predniSONE   40 mg Oral Q breakfast   rivaroxaban   20 mg Oral Q supper   sertraline   75 mg Oral q AM   tamsulosin   0.8 mg Oral Daily   Continuous Infusions:     LOS: 6 days    Time spent:    Sigurd Pac, MD [facility]   03/23/2024, 11:23 AM

## 2024-03-23 NOTE — Progress Notes (Signed)
 Electrophysiology Office Note:   Date:  03/28/2024  ID:  JEFERSON BOOZER, DOB [birth year], MRN [id number]  Primary Cardiologist: Redell Shallow, MD Primary Heart Failure: Ria Commander, DO Electrophysiologist: OLE ONEIDA HOLTS, MD       History of Present Illness:   [name] is a 58 y.o. male with h/o HFrecEF s/p ICD, cardiac arrest x2 (hypokalemia with VT),  NICM, VT, HTN, OSA (not on CPAP), CKD IIIb, GERD, DM II seen today for post hospital follow up.    Admit 03/03/24 for VT and was re-loaded with IV amiodarone  & transitioned to PO.    Admitted 10/8- 03/24/24 with chest pain, lightheadedness. Approx 3 weeks prior to this admit he was seen for VT in the setting of hypokalemia. He was found to have a LUL PNA  Since discharge from hospital the patient reports he always feels tired but tries to only complain on Wednesday's. He states he always feels poorly.  Has intermittent dizziness and intermittent hand tremor.     He denies chest pain, palpitations, orthopnea, nausea, vomiting, dizziness, syncope, edema, weight gain, or early satiety.   Review of systems complete and found to be negative unless listed in HPI.   EP Information / Studies Reviewed:    EKG is not ordered today. EKG from 03/16/24 reviewed which showed SR with non-specific IVCD 87 bpm      ICD Interrogation-  reviewed in detail today,  See PACEART report.  Device History: Abbott Dual Chamber ICD implanted 06/25/13 for HFrEF, RA lead 05/15/22 History of appropriate therapy: Yes History of AAD therapy: Yes; currently on amiodarone .    Arrhythmia / AAD / Pertinent EP Studies VT Amiodarone  > initiated for VT storm 04/2022   Risk Assessment/Calculations:              Physical Exam:   VS:  BP 101/62   Pulse 72   Ht 6' 1 (1.854 m)   Wt 275 lb (124.7 kg)   SpO2 92%   BMI 36.28 kg/m    Wt Readings from Last 3 Encounters:  03/28/24 275 lb (124.7 kg)  03/23/24 271 lb 2.7 oz (123 kg)  03/10/24 276 lb  (125.2 kg)     GEN: Well nourished, well developed in no acute distress NECK: No JVD; No carotid bruits CARDIAC: Regular rate and rhythm, no murmurs, rubs, gallops RESPIRATORY:  Clear to auscultation without rales, wheezing or rhonchi  ABDOMEN: Soft, non-tender, non-distended EXTREMITIES:  No edema; No deformity   ASSESSMENT AND PLAN:    Chronic Systolic Dysfunction s/p Abbott dual chamber ICD  PMVT / VF  NICM -euvolemic on exam   -Stable on an appropriate medical regimen -Normal ICD function -See Pace Art report -No changes today -continue Toprol  12.5mg  daily  -monitor electrolytes closely > historically has had VT/VF in setting of electrolyte disturbances   High Risk Medication Monitoring: Amiodarone  -continue amiodarone  200 mg daily  -update amio labs / post hospital lab follow up -lightheadedness / dizziness & tremor may be related to amiodarone  > discussed that patient is in a tough situation as limited options for control of VT, reviewed medication compliance   Recent LUL PNA  -treated with Abx while inpatient  -assess CBC with amio labs above   OSA  -non-compliant due to claustrophobia > discussed going for mask fitting as there are many options on the market -discussed pulmonary follow up to further find options   Disposition:   Follow up with Dr. HOLTS / EP APP in 6  months for amiodarone  monitoring    Signed, Daphne Barrack, NP-C, AGACNP-BC [facility] - Electrophysiology  03/28/2024, 3:29 PM

## 2024-03-23 NOTE — Inpatient Diabetes Management (Signed)
 Inpatient Diabetes Program Recommendations  AACE/ADA: New Consensus Statement on Inpatient Glycemic Control (2015)  Target Ranges:  Prepandial:   less than 140 mg/dL      Peak postprandial:   less than 180 mg/dL (1-2 hours)      Critically ill patients:  140 - 180 mg/dL   Lab Results  Component Value Date   GLUCAP 151 (H) 03/23/2024   HGBA1C 8.5 (A) 12/15/2023    Latest Reference Range & Units 03/22/24 05:53 03/22/24 11:31 03/22/24 15:35 03/22/24 22:25 03/23/24 01:24 03/23/24 06:01  Glucose-Capillary 70 - 99 mg/dL 822 (H) Novolog  9 units @ 0613 209 (H) Novolog  11 units 365 (H) Novolog  21 units @1604  341 (H) 241 (H) 151 (H) Novolog  3 units  (H): Data is abnormally high  Diabetes history: DM2 Outpatient Diabetes medications: Lantus  30 units daily, Novolog  22 units BID with meals Current orders for Inpatient glycemic control: Lantus  50 units at HS Novolog  6 units tid meal coverage Novolog  0-15 units correction scale TID  Inpatient Diabetes Program Recommendations:   Postprandial CBGs greater than >180 Please consider: -Increase Novolog  meal coverage to 8 units tid if eats 50%  Thank you, [name] E. Cailey Trigueros, RN, MSN, CNS, CDCES  Diabetes Coordinator Inpatient Glycemic Control Team Team Pager [phone] (8am-5pm) 03/23/2024 8:33 AM

## 2024-03-23 NOTE — Progress Notes (Signed)
 Mobility Specialist Progress Note:   03/23/24 1340  Mobility  Activity Ambulated with assistance  Level of Assistance Standby assist, set-up cues, supervision of patient - no hands on  Assistive Device None  Distance Ambulated (ft) 80 ft  Activity Response Tolerated well  Mobility Referral Yes  Mobility visit 1 Mobility  Mobility Specialist Start Time (ACUTE ONLY) 1340  Mobility Specialist Stop Time (ACUTE ONLY) 1400  Mobility Specialist Time Calculation (min) (ACUTE ONLY) 20 min   Pt agreeable to mobility session. Required no physical assistance throughout ambulation. Pt reaches for hallway handrails occasionally. Pt c/o SOB with exertion, SpO2 94% on RA. Encouraged frequent ambulation, pt left sitting EOB with all needs met.   Therisa Rana Mobility Specialist Please contact via SecureChat or  Rehab office at [phone]

## 2024-03-23 NOTE — TOC Initial Note (Signed)
 Transition of Care [facility]) - Initial/Assessment Note    Patient Details  Name: [name] MRN: [id number] Date of Birth: [birth year]  [facility] of [facility]) CM/SW Contact:    Waddell Barnie Rama, RN Phone Number: 03/23/2024, 4:34 PM  Clinical Narrative:                 From home with brother, has PCP and insurance on file, states has no HH services in place at this time  has rollator, walker, shower chair, bsc at home.  States family member will transport them home at [employer] and family is support system, states gets medications from [facility] on [address]  He would like to get medications from the [facility].  Pta self ambulatory.   There are no ICM needs identified  at this time.  Please place consult for ICM needs.    Expected Discharge Plan: Home/Self Care Barriers to Discharge: Continued Medical Work up   Patient Goals and CMS Choice Patient states their goals for this hospitalization and ongoing recovery are:: return home with brother   Choice offered to / list presented to : NA      Expected Discharge Plan and Services In-house Referral: NA Discharge Planning Services: CM Consult Post Acute Care Choice: NA Living arrangements for the past 2 months: Single Family Home                 DME Arranged: N/A DME Agency: NA       HH Arranged: NA          Prior Living Arrangements/Services Living arrangements for the past 2 months: Single Family Home Lives with:: Siblings (brother) Patient language and need for interpreter reviewed:: Yes Do you feel safe going back to the place where you live?: Yes      Need for Family Participation in Patient Care: Yes (Comment) Care giver support system in place?: Yes (comment) Current home services: DME (rollator, walker, shower chair, bsc) Criminal Activity/Legal Involvement Pertinent to Current Situation/Hospitalization: No - Comment as needed  Activities of Daily Living   ADL Screening (condition at time of  admission) Independently performs ADLs?: Yes (appropriate for developmental age) Is the patient deaf or have difficulty hearing?: No Does the patient have difficulty seeing, even when wearing glasses/contacts?: No Does the patient have difficulty concentrating, remembering, or making decisions?: No  Permission Sought/Granted Permission sought to share information with : Case Manager Permission granted to share information with : Yes, Verbal Permission Granted  Share Information with NAME: Dorn Calin     Permission granted to share info w Relationship: brother  Permission granted to share info w Contact Information: [phone]  Emotional Assessment Appearance:: Appears stated age Attitude/Demeanor/Rapport: Engaged Affect (typically observed): Appropriate Orientation: : Oriented to Self, Oriented to Place, Oriented to  Time, Oriented to Situation Alcohol / Substance Use: Not Applicable Psych Involvement: No (comment)  Admission diagnosis:  Other chest pain [R07.89] AKI (acute kidney injury) [N17.9] Community acquired pneumonia of left lower lobe of lung [J18.9] Patient Active Problem List   Diagnosis Date Noted   Acute respiratory failure with hypoxia (HCC) 12/20/2023   COPD with acute exacerbation (HCC) 12/19/2023   Acute exacerbation of CHF (congestive heart failure) (HCC) 12/19/2023   ARF (acute renal failure) 10/08/2023   Acute gout 10/08/2023   Anemia 10/08/2023   Acute renal failure (ARF) 10/08/2023   Hypotension 09/07/2023   Acute deep vein thrombosis (DVT) of left upper extremity (HCC) 04/24/2023   Cellulitis of  perineum 04/02/2023   Heart failure with mildly reduced ejection fraction (HFmrEF, 41-49%) (HCC) 02/28/2023   Class 3 obesity (HCC) 02/28/2023   SOB (shortness of breath) 02/27/2023   Loud snoring 10/28/2022   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   Hyperlipidemia associated with type 2 diabetes mellitus (HCC) 09/18/2022   PTSD (post-traumatic  stress disorder) 07/10/2022   Panic attack 06/17/2022   Passive suicidal ideations 06/17/2022   History of ventricular tachycardia 06/14/2022   Essential hypertension 06/14/2022   Stage 3b chronic kidney disease (HCC) 05/15/2022   Acute gout due to renal impairment involving left ankle 05/07/2022   Acute gout due to renal impairment involving right knee 05/07/2022   Hyponatremia 05/07/2022   Diarrhea 05/06/2022   Hyperglycemia 05/06/2022   Cellulitis of buttock 05/06/2022   Acute on chronic combined systolic and diastolic CHF (congestive heart failure) (HCC) 05/06/2022   Acute renal failure superimposed on stage 3b chronic kidney disease (HCC) 05/06/2022   Hypokalemia 05/05/2022   Rectal abscess 05/05/2022   Left buttock abscess 05/05/2022   Cardiac arrest (HCC) 05/03/2022   AKI (acute kidney injury) 05/03/2022   VT (ventricular tachycardia) (HCC) 05/03/2022   CKD (chronic kidney disease) stage 2, GFR 60-89 ml/min 10/24/2019   Acute non-recurrent frontal sinusitis 06/27/2019   COVID-19 virus infection 06/27/2019   Glaucoma suspect 12/22/2018   Seasonal and perennial allergic rhinoconjunctivitis 10/25/2018   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   Urticaria 05/03/2018   S/P inguinal hernia repair 03/01/2018   Unilateral inguinal hernia without obstruction or gangrene 07/24/2017   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   Hypersomnia 02/28/2016   Abnormal PFT 02/24/2016   Dyspnea 01/28/2016   Diabetic polyneuropathy associated with type 2 diabetes mellitus (HCC) 12/18/2015   Lumbar back pain 12/18/2015   Type 2 diabetes mellitus with hyperlipidemia (HCC) 09/17/2015   Left median nerve neuropathy 04/16/2015   Lesion of right median nerve at forearm 04/16/2015   ICD (implantable cardioverter-defibrillator) in place 03/28/2014   Chronic systolic congestive heart failure (HCC) 03/28/2014   Nonischemic cardiomyopathy (HCC) 11/09/2013   Syncope  10/20/2013   Chest pain 07/28/2013   RENAL CALCULUS 05/13/2009   Obesity 05/08/2009   Former heavy cigarette smoker (20-39 per day) 05/08/2009   HOARSENESS 05/08/2009   GAD (generalized anxiety disorder) 02/28/2008   ASTHMATIC BRONCHITIS, ACUTE 02/28/2008   GERD 02/28/2008   IRRITABLE BOWEL SYNDROME 02/28/2008   PCP:  Vicci Barnie NOVAK, MD Pharmacy:   [facility] [address] [place], [place] - [address] [place] [address] Phone: [phone] Fax: [phone]     Social Drivers of Health (SDOH) Social History: SDOH Screenings   Food Insecurity: No Food Insecurity (03/17/2024)  Housing: Low Risk  (03/17/2024)  Transportation Needs: No Transportation Needs (03/17/2024)  Utilities: Not At Risk (03/17/2024)  Recent Concern: Utilities - At Risk (03/02/2024)  Alcohol Screen: Low Risk  (05/25/2023)  Depression (PHQ2-9): Low Risk  (03/02/2024)  Recent Concern: Depression (PHQ2-9) - High Risk (02/05/2024)  Financial Resource Strain: Low Risk  (05/25/2023)  Recent Concern: Financial Resource Strain - High Risk (03/24/2023)  Physical Activity: Inactive (05/25/2023)  Social Connections: Moderately Isolated (03/17/2024)  Stress: Stress Concern Present (05/25/2023)  Tobacco Use: Medium Risk (03/17/2024)  Health Literacy: Adequate Health Literacy (05/25/2023)   SDOH Interventions:     Readmission Risk Interventions    03/23/2024    4:27 PM 12/21/2023    4:08 PM  Readmission Risk Prevention Plan  Transportation Screening Complete Complete  Medication Review Oceanographer) Complete Complete  PCP or Specialist appointment within 3-5 days of discharge Complete Complete  HRI or Home Care Consult Complete Complete  Palliative Care Screening Not Applicable Not Applicable  Skilled Nursing Facility Not Applicable Not Applicable

## 2024-03-23 NOTE — Progress Notes (Signed)
 EPIC Encounter for ICM Monitoring  Patient Name: [name] is a 58 y.o. male Date: 03/23/2024 Primary Care Physican: Vicci Barnie NOVAK, MD Primary Cardiologist: Crenshaw/[name] Electrophysiologist: [name] 08/24/2023 Office Weight: 295 lbs    09/28/2023 Weight: 291 lbs       10/13/2023  Weight: 280.2 lbs discharge weight 10/20/2023 Weight:  Not weighing at home    10/23/2023 Weight: Pt reports 5/27 he thinks he was 285 lbs at home 11/03/2023 Weight: 294 lbs (10 lb weight gain since 5/16) 12/15/2023 Office Weight: 289 lbs 01/02/2024 Hospital Weight: 285.5 lbs 01/13/2024 Weight: 277 lbs 02/02/2024 Weight: 280 lbs (was 274 lbs) 02/09/2024 Weight: 277 lbs 03/05/2024 Weight: 279 lbs (before 03/07/2024 Metolazone )   Currently hospitalized since 03/16/2024.  Transmission results reviewed.     Since 03/07/2024 ICM Remote Transmission:  [facility] thoracic impedance suggesting possible dryness from 03/09/2024-03/18/2024 followed by possible fluid accumulation starting 03/19/2024 (hospitalized starting 03/16/2024) but trending back toward baseline.   Prescribed dosage:  Torsemide  20 mg take 2 tablet(s) (40 mg total) by mouth daily.  Potassium 20 mEq take 2 tablet(s) (40 mEq total) by mouth twice a day Metolazone  5 mg take 1 tablet by mouth once a week.  Pt takes on Mondays (Nephrologist said he could take it up to twice a week).     Labs: 03/23/2024 Creatinine 2.58, BUN 60, Potassium 3.3, Sodium 137, GFR 28  03/22/2024 Creatinine 2.64, BUN 62, Potassium 3.5, Sodium 135, GFR 27  03/21/2024 Creatinine 2.47, BUN 58, Potassium 3.7, Sodium 136, GFR 29  03/20/2024 Creatinine 2.61, BUN 54, Potassium 3.4, Sodium 137, GFR 27 03/19/2024 Creatinine 2.95, BUN 51, Potassium 3.7, Sodium 134, GFR 24  03/18/2024 Creatinine 3.48, BUN 53, Potassium 3.3, Sodium 137, GFR 19  03/16/2024 Creatinine 3.46, BUN 39, Potassium 3.7, Sodium 139, GFR 19  A complete set of results can be found in Results Review.   Recommendations:   Currently hospitalized.    Follow-up plan: ICM clinic phone appointment on 04/11/2024.   91 day device clinic remote transmission 05/18/2024.     EP/Cardiology Office Visits:   04/21/2024 with HF Clinic.  03/28/2024 with Daphne Barrack, NP.     Copy of ICM check sent to Dr [name].   Remote monitoring is medically necessary for Heart Failure Management.    Daily Thoracic Impedance ICM trend: 12/22/2023 through 03/21/2024.    12-14 Month Thoracic Impedance ICM trend:     [name], RN 03/23/2024 7:52 AM

## 2024-03-24 ENCOUNTER — Ambulatory Visit: Admitting: Internal Medicine

## 2024-03-24 ENCOUNTER — Other Ambulatory Visit (HOSPITAL_COMMUNITY): Payer: Self-pay

## 2024-03-24 ENCOUNTER — Telehealth: Admitting: *Deleted

## 2024-03-24 DIAGNOSIS — N179 Acute kidney failure, unspecified: Secondary | ICD-10-CM | POA: Diagnosis not present

## 2024-03-24 LAB — BASIC METABOLIC PANEL WITH GFR
Anion gap: 13 (ref 5–15)
BUN: 59 mg/dL — ABNORMAL HIGH (ref 6–20)
CO2: 28 mmol/L (ref 22–32)
Calcium: 9.2 mg/dL (ref 8.9–10.3)
Chloride: 95 mmol/L — ABNORMAL LOW (ref 98–111)
Creatinine, Ser: 2.51 mg/dL — ABNORMAL HIGH (ref 0.61–1.24)
GFR, Estimated: 29 mL/min — ABNORMAL LOW (ref >60)
Glucose, Bld: 232 mg/dL — ABNORMAL HIGH (ref 70–99)
Potassium: 4 mmol/L (ref 3.5–5.1)
Sodium: 136 mmol/L (ref 135–145)

## 2024-03-24 LAB — GLUCOSE, CAPILLARY
Glucose-Capillary: 139 mg/dL — ABNORMAL HIGH (ref 70–99)
Glucose-Capillary: 167 mg/dL — ABNORMAL HIGH (ref 70–99)

## 2024-03-24 MED ORDER — ALLOPURINOL 100 MG PO TABS
100.0000 mg | ORAL_TABLET | Freq: Every day | ORAL | 1 refills | Status: DC
  Filled 2024-03-24: qty 30, 30d supply, fill #0

## 2024-03-24 MED ORDER — ISOSORBIDE MONONITRATE ER 30 MG PO TB24
15.0000 mg | ORAL_TABLET | Freq: Every day | ORAL | 1 refills | Status: DC
  Filled 2024-03-24: qty 30, 60d supply, fill #0

## 2024-03-24 NOTE — TOC Transition Note (Signed)
 Transition of Care [facility]) - Discharge Note   Patient Details  Name: [name] MRN: [id number] Date of Birth: 04-22-1966  [facility]) CM/SW Contact:  Waddell Barnie Rama, RN Phone Number: 03/24/2024, 1:50 PM   Clinical Narrative:    For dc today, has no needs, has transportation.      Barriers to Discharge: Continued Medical Work up   Patient Goals and CMS Choice Patient states their goals for this hospitalization and ongoing recovery are:: return home with brother   Choice offered to / list presented to : NA      Discharge Placement                       Discharge Plan and Services Additional resources added to the After Visit Summary for   In-house Referral: NA Discharge Planning Services: CM Consult Post Acute Care Choice: NA          DME Arranged: N/A DME Agency: NA       HH Arranged: NA          Social Drivers of Health (SDOH) Interventions SDOH Screenings   Food Insecurity: No Food Insecurity (03/17/2024)  Housing: Low Risk  (03/17/2024)  Transportation Needs: No Transportation Needs (03/17/2024)  Utilities: Not At Risk (03/17/2024)  Recent Concern: Utilities - At Risk (03/02/2024)  Alcohol Screen: Low Risk  (05/25/2023)  Depression (PHQ2-9): Low Risk  (03/02/2024)  Recent Concern: Depression (PHQ2-9) - High Risk (02/05/2024)  Financial Resource Strain: Low Risk  (05/25/2023)  Recent Concern: Financial Resource Strain - High Risk (03/24/2023)  Physical Activity: Inactive (05/25/2023)  Social Connections: Moderately Isolated (03/17/2024)  Stress: Stress Concern Present (05/25/2023)  Tobacco Use: Medium Risk (03/17/2024)  Health Literacy: Adequate Health Literacy (05/25/2023)     Readmission Risk Interventions    03/23/2024    4:27 PM 12/21/2023    4:08 PM  Readmission Risk Prevention Plan  Transportation Screening Complete Complete  Medication Review (RN Care Manager) Complete Complete  PCP or Specialist appointment within  3-5 days of discharge Complete Complete  HRI or Home Care Consult Complete Complete  Palliative Care Screening Not Applicable Not Applicable  Skilled Nursing Facility Not Applicable Not Applicable

## 2024-03-24 NOTE — Progress Notes (Signed)
 Explained discharge instructions to patient. Reviewed follow up appointment and next medication administration times. Also reviewed education. Patient verbalized having an understanding for instructions given. All belongings are in the patient's possession. Patient pick up his TOC meds on his way down to the discharge lounge. IV and telemetry were removed. CCMD was notified. Patient said it will take him 20 minutes to dressed. No other needs verbalized. Transportation will be called to transport downstairs to the discharge lounge to await his ride.

## 2024-03-24 NOTE — Progress Notes (Signed)
SATURATION QUALIFICATIONS: (This note is used to comply with regulatory documentation for home oxygen)  Patient Saturations on Room Air at Rest = 96  Patient Saturations on Room Air while Ambulating = 94   

## 2024-03-24 NOTE — Discharge Summary (Signed)
 Physician Discharge Summary   Patient: [name] MRN: [id number] DOB: 1965-12-20  Admit date:     03/16/2024  Discharge date: 03/24/24  Discharge Physician: Drue ONEIDA Potter   PCP: Vicci Barnie NOVAK, MD   Recommendations at discharge:  Follow-up with PCP  Discharge Diagnoses:  AKI on CKD 4 Left upper lobe pneumonia Chest wall pain, shoulder pain Acute on chronic systolic CHF History of A-fib History of cardiac arrest sp ICD Type 2 diabetes mellitus BPH  Hospital Course:  58/M with chronic systolic CHF with recovered EF, NICM, OSA intolerant to CPAP, CKD 4, type 2 diabetes mellitus, gout.  Has been admitted multiple times with CHF or kidney failure. - Recently admitted 9/25 after syncopal episode with polymorphic VT/VF terminated with ATP, loaded with amiodarone , no further VT transition to p.o. - Presented to the ED 10/9 with central chest pain, also reports choking on a drink a week ago, he took some Advil thereafter for pain relief despite CKD. - In the ED vital signs were stable, labs noted creatinine of 3.4, up from baseline of 2.3-2.6, CT chest noted developing patchy airspace infiltrates in the left upper lung on the background of chronic fibrosis and atelectasis. - Started on IV fluids for AKI, antibiotics and admitted - Chest wall and shoulder pains, ESR and uric acid significantly elevated, requiring steroid as well as allopurinol therapy.  Has been weaned off oxygen  doing better today and walk test does not show patient needs home oxygen .  Patient is therefore cleared for discharge today and will follow-up with PCP  Consultants: None Procedures performed: None Disposition: Home Diet recommendation:  Cardiac diet DISCHARGE MEDICATION: Allergies as of 03/24/2024       Reactions   Bee Venom Anaphylaxis   Trulicity  [dulaglutide ] Anaphylaxis, Diarrhea   Extra dose of Trulicity  caused cardiac arrest. Patient experience onset of diarrhea with using this medication -  cleared after med stopped.        Medication List     TAKE these medications    acetaminophen -codeine  300-30 MG tablet Commonly known as: TYLENOL  #3 Take 1 tablet by mouth every 8 (eight) hours as needed for moderate pain (pain score 4-6).   allopurinol 100 MG tablet Commonly known as: ZYLOPRIM Take 1 tablet (100 mg total) by mouth daily. Start taking on: March 25, 2024   amiodarone  200 MG tablet Commonly known as: PACERONE  Take 2 tablets (400 mg) twice daily for 2 weeks. Then on October 5, decrease to 1 tablet (200 mg) once daily.   atorvastatin  10 MG tablet Commonly known as: LIPITOR Take 1 tablet (10 mg total) by mouth daily.   benzonatate  200 MG capsule Commonly known as: TESSALON  Take 200 mg by mouth 2 (two) times daily as needed for cough.   cetirizine  10 MG tablet Commonly known as: EQ Allergy Relief (Cetirizine ) Take 1 tablet (10 mg total) by mouth daily.   cholecalciferol  25 MCG (1000 UNIT) tablet Commonly known as: VITAMIN D3 Take 1,000 Units by mouth in the morning.   clonazePAM  0.5 MG tablet Commonly known as: KlonoPIN  Take 1 tablet (0.5 mg total) by mouth 2 (two) times daily as needed for anxiety. What changed: when to take this   Clotrimazole  1 % Oint Apply 1 Application topically 2 (two) times daily as needed (irritation).   colchicine  0.6 MG tablet Take 0.5 tablets (0.3 mg total) by mouth every Monday, Wednesday, and Friday.   EPINEPHrine  0.3 mg/0.3 mL Soaj injection Commonly known as: EPI-PEN Inject 0.3 mg into  the muscle as needed.   fluticasone  50 MCG/ACT nasal spray Commonly known as: FLONASE  Place 2 sprays into both nostrils daily. What changed:  when to take this reasons to take this   FreeStyle Libre 2 Plus Sensor Misc Change sensor every 15 days.   gabapentin  300 MG capsule Commonly known as: NEURONTIN  Take 1 capsule (300 mg total) by mouth at bedtime.   HYDROcodone -acetaminophen  5-325 MG tablet Commonly known as:  NORCO/VICODIN Take 1-2 tablets by mouth every 4 (four) hours as needed for moderate pain (pain score 4-6) or severe pain (pain score 7-10).   isosorbide mononitrate 30 MG 24 hr tablet Commonly known as: IMDUR Take 0.5 tablets (15 mg total) by mouth daily. Start taking on: March 25, 2024   Lantus  SoloStar 100 UNIT/ML Solostar Pen Generic drug: insulin  glargine Inject 30 Units into the skin at bedtime.   latanoprost 0.005 % ophthalmic solution Commonly known as: XALATAN Place 1 drop into both eyes at bedtime.   magnesium  oxide 400 MG tablet Commonly known as: MAG-OX Take 1 tablet (400 mg total) by mouth daily.   metolazone  5 MG tablet Commonly known as: ZAROXOLYN  Take 1 tablet (5 mg total) by mouth once a week.   metoprolol  succinate 25 MG 24 hr tablet Commonly known as: TOPROL -XL Take 0.5 tablets (12.5 mg total) by mouth daily.   NovoLOG  FlexPen 100 UNIT/ML FlexPen Generic drug: insulin  aspart Inject 22 Units into the skin 2 (two) times daily.   nystatin  powder Commonly known as: MYCOSTATIN /NYSTOP  Apply 1 Application topically 3 (three) times daily.   pantoprazole  40 MG tablet Commonly known as: Protonix  Take 1 tablet (40 mg total) by mouth daily.   potassium chloride  SA 20 MEQ tablet Commonly known as: KLOR-CON  M Take 2 tablets (40 mEq total) by mouth 2 (two) times daily. Take an extra 20 mEq when you take Metolazone .   prednisoLONE acetate 1 % ophthalmic suspension Commonly known as: PRED FORTE Place 1 drop into the left eye at bedtime.   rivaroxaban  20 MG Tabs tablet Commonly known as: XARELTO  Take 1 tablet (20 mg total) by mouth daily with supper.   sertraline  50 MG tablet Commonly known as: Zoloft  Take 1.5 tablets (75 mg total) by mouth in the morning.   Symbicort  160-4.5 MCG/ACT inhaler Generic drug: budesonide -formoterol  Inhale 2 puffs into the lungs in the morning and at bedtime.   tamsulosin  0.4 MG Caps capsule Commonly known as: FLOMAX  Take 2  capsules (0.8 mg total) by mouth daily.   TechLite Plus Pen Needles 32G X 4 MM Misc Generic drug: Insulin  Pen Needle Use to inject insulin  into the skin 3 times per day.   torsemide  20 MG tablet Commonly known as: DEMADEX  Take 2 tablets (40 mg total) by mouth daily.        Follow-up Information     Vicci Barnie NOVAK, MD Follow up on 03/30/2024.   Specialty: Internal Medicine Why: already has follow scheduled Contact information: [address] [place] [address] [place] [address] [phone]                Discharge Exam: Fredricka Weights   03/17/24 1719 03/21/24 0432 03/23/24 0300  Weight: 123.2 kg 123.7 kg 123 kg   General exam: Obese chronically ill-appearing, AO x 3 Respiratory system: Few scattered rhonchi, decreased at the bases  positive chest wall tenderness Cardiovascular system: S1 & S2 heard, RRR.  Abd: nondistended, soft and nontender.Normal bowel sounds heard. Central nervous system: Alert and oriented. No focal neurological  deficits. Extremities: no edema Skin: No rashes Psychiatry:  Mood & affect appropriate.     Condition at discharge: good  The results of significant diagnostics from this hospitalization (including imaging, microbiology, ancillary and laboratory) are listed below for reference.   Imaging Studies: DG Swallowing Func-Speech Pathology Result Date: 03/21/2024 Table formatting from the original result was not included. Modified Barium Swallow Study Patient Details Name: TYRICE HEWITT MRN: [id number] Date of Birth: 12/30/1965 Today's Date: 03/21/2024 HPI/PMH: HPI: 58 yo male presenting to ED 10/8 with central chest pain. Admitted with AKI and LUL pneumonia (CT shows linear infiltration or atelectasis in the lung bases and developing patchy airspace infiltrates in the LUL, likely represents developing pneumonia superimposed upon chronic fibrosis and atelectasis). Per MD note, pt reports choking when drinking recently. Seen by SLP 12/2023 with  functional appearing oropharyngeal swallow and greater concern for esophageal dysphagia. PMH: chronic systolic CHF with recovered EF, NICM, OSA intolerant to CPAP, CKD 4, T2DM, gout Clinical Impression: Clinical Impression: Pt presents wtih a functional oropharyngeal swallow. He has piecemeal swallowing with purees as well as some more repetitive lingual motion, not observed with other consistencies but still functional with good oral clearance. Airway protection is complete with no aspiration and rare penetration, noted with thin liquids but clearing spontaneously (PAS 2, considered to be normal). No overt esophageal retention of barium noted during scan, but pt does endorse a h/o reflux. He believes it is more managed now, but encouraged him to continue to work on this. Esophageal and aspiration precautions were reviewed. SLP to sign off acutely. DIGEST Swallow Severity Rating*  Safety: 0  Efficiency:0  Overall Pharyngeal Swallow Severity: 0 1: mild; 2: moderate; 3: severe; 4: profound *The Dynamic Imaging Grade of Swallowing Toxicity is standardized for the head and neck cancer population, however, demonstrates promising clinical applications across populations to standardize the clinical rating of pharyngeal swallow safety and severity. Factors that may increase risk of adverse event in presence of aspiration Noe & Lianne 2021): Factors that may increase risk of adverse event in presence of aspiration Noe & Lianne 2021): Respiratory or GI disease Recommendations/Plan: Swallowing Evaluation Recommendations Swallowing Evaluation Recommendations Recommendations: PO diet PO Diet Recommendation: Regular; Thin liquids (Level 0) Liquid Administration via: Cup; Straw Medication Administration: Whole meds with liquid Supervision: Patient able to self-feed Swallowing strategies  : Slow rate; Small bites/sips; Follow solids with liquids Postural changes: Position pt fully upright for meals; Stay upright 30-60 min  after meals Oral care recommendations: Oral care BID (2x/day) Treatment Plan Treatment Plan Treatment recommendations: No treatment recommended at this time Follow-up recommendations: No SLP follow up Functional status assessment: Patient has not had a recent decline in their functional status. Recommendations Recommendations for follow up therapy are one component of a multi-disciplinary discharge planning process, led by the attending physician.  Recommendations may be updated based on patient status, additional functional criteria and insurance authorization. Assessment: Orofacial Exam: Orofacial Exam Oral Cavity - Dentition: Adequate natural dentition Anatomy: Anatomy: Suspected cervical osteophytes Boluses Administered: Boluses Administered Boluses Administered: Thin liquids (Level 0); Mildly thick liquids (Level 2, nectar thick); Moderately thick liquids (Level 3, honey thick); Puree; Solid  Oral Impairment Domain: Oral Impairment Domain Lip Closure: No labial escape (limited visibility of lips on MBS but no outward escape noted) Tongue control during bolus hold: Cohesive bolus between tongue to palatal seal Bolus preparation/mastication: Timely and efficient chewing and mashing Bolus transport/lingual motion: Repetitive/disorganized tongue motion Oral residue: Majority of bolus remaining Location of oral  residue : Tongue Initiation of pharyngeal swallow : Posterior laryngeal surface of the epiglottis  Pharyngeal Impairment Domain: Pharyngeal Impairment Domain Soft palate elevation: No bolus between soft palate (SP)/pharyngeal wall (PW) Anterior hyoid excursion: Complete anterior movement Epiglottic movement: Complete inversion Laryngeal vestibule closure: Complete, no air/contrast in laryngeal vestibule Pharyngeal stripping wave : Present - complete Pharyngeal contraction (A/P view only): N/A Pharyngoesophageal segment opening: Complete distension and complete duration, no obstruction of flow (limited view  due to shadow from shoulder, but complete clearance from pharynx noted) Tongue base retraction: No contrast between tongue base and posterior pharyngeal wall (PPW) Pharyngeal residue: Complete pharyngeal clearance Location of pharyngeal residue: N/A  Esophageal Impairment Domain: Esophageal Impairment Domain Esophageal clearance upright position: Complete clearance, esophageal coating Pill: Pill Consistency administered: Thin liquids (Level 0) Thin liquids (Level 0): [facility] Penetration/Aspiration Scale Score: Penetration/Aspiration Scale Score 1.  Material does not enter airway: Mildly thick liquids (Level 2, nectar thick); Moderately thick liquids (Level 3, honey thick); Puree; Solid; Pill 2.  Material enters airway, remains ABOVE vocal cords then ejected out: Thin liquids (Level 0) Compensatory Strategies: Compensatory Strategies Compensatory strategies: No   General Information: Caregiver present: No  Diet Prior to this Study: Regular; Thin liquids (Level 0)   Temperature : Normal   Respiratory Status: WFL   Supplemental O2: Nasal cannula   History of Recent Intubation: No  Behavior/Cognition: Alert; Cooperative; Pleasant mood Self-Feeding Abilities: Needs assist with self-feeding (due to tight positioning in chair, otherwise able to self-feed) Baseline vocal quality/speech: Normal No data recorded Volitional Swallow: Able to elicit Exam Limitations: Limited visibility (shadow from shoulder) Goal Planning: Prognosis for improved oropharyngeal function: Good No data recorded No data recorded Patient/Family Stated Goal: none stated Consulted and agree with results and recommendations: Patient Pain: Pain Assessment Pain Assessment: Faces Faces Pain Scale: 0 End of Session: Start Time:SLP Start Time (ACUTE ONLY): 9173 Stop Time: SLP Stop Time (ACUTE ONLY): 9161 Time Calculation:SLP Time Calculation (min) (ACUTE ONLY): 12 min Charges: SLP Evaluations $ SLP Speech Visit: 1 Visit SLP Evaluations $MBS Swallow: 1 Procedure  SLP visit diagnosis: SLP Visit Diagnosis: Dysphagia, unspecified (R13.10) Past Medical History: Past Medical History: Diagnosis Date  AICD (automatic cardioverter/defibrillator) present   Chronic bronchitis (HCC)   get it most q yr (12/23/2013)  Chronic systolic (congestive) heart failure (HCC)   Fracture of left humerus   a. 07/2013.  GERD (gastroesophageal reflux disease)   not at present time  Heart murmur   born w/it   History of renal calculi   Hypertension   Kidney stones   NICM (nonischemic cardiomyopathy) (HCC)   a. 07/2013 Echo: EF 20-25%, diff HK, Gr2 DD, mild MR, mod dil LA/RA. 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   Shockable heart rhythm detected by automated external defibrillator   Syncope   Type II diabetes mellitus (HCC) 2006 Past Surgical History: Past Surgical History: Procedure Laterality Date  CARDIAC CATHETERIZATION  08/10/13  CHOLECYSTECTOMY  ~ 2010  IMPLANTABLE CARDIOVERTER DEFIBRILLATOR IMPLANT  12/23/2013  STJ Fortify ICD implanted by Dr Kelsie for cardiomyopathy and syncope  IMPLANTABLE CARDIOVERTER DEFIBRILLATOR IMPLANT N/A 12/23/2013  Procedure: IMPLANTABLE CARDIOVERTER DEFIBRILLATOR IMPLANT;  Surgeon: Lynwood JONETTA Kelsie, MD;  Location: [facility] CATH LAB;  Service: Cardiovascular;  Laterality: N/A;  INGUINAL HERNIA REPAIR Left 03/01/2018  Procedure: OPEN REPAIR OF LEFT INGUINAL HERNIA WITH MESH;  Surgeon: Tanda Locus, MD;  Location: [facility] ORS;  Service: General;  Laterality: Left;  LEAD REVISION/REPAIR N/A 05/15/2022  Procedure: LEAD REVISION/REPAIR;  Surgeon: [name] Ole DASEN, MD;  Location: [facility] INVASIVE CV LAB;  Service: Cardiovascular;  Laterality: N/A;  LEFT HEART CATHETERIZATION WITH CORONARY ANGIOGRAM N/A 08/10/2013  Procedure: LEFT HEART CATHETERIZATION WITH CORONARY ANGIOGRAM;  Surgeon: Candyce GORMAN Reek, MD;  Location: [facility] CATH LAB;  Service: Cardiovascular;  Laterality: N/A;  ORIF HUMERUS FRACTURE Left 08/12/2013   Procedure: OPEN REDUCTION INTERNAL FIXATION (ORIF) HUMERAL SHAFT FRACTURE;  Surgeon: Jerona LULLA Sage, MD;  Location: [facility] OR;  Service: Orthopedics;  Laterality: Left;  Open Reduction Internal Fixation Left Humerus  RIGHT/LEFT HEART CATH AND CORONARY ANGIOGRAPHY N/A 05/12/2022  Procedure: RIGHT/LEFT HEART CATH AND CORONARY ANGIOGRAPHY;  Surgeon: Gardenia Led, DO;  Location: [facility] INVASIVE CV LAB;  Service: Cardiovascular;  Laterality: N/A;  URETEROSCOPY    laser for kidney stones Leita SAILOR., M.A. CCC-SLP Acute Rehabilitation Services Office: [phone] Secure chat preferred 03/21/2024, 9:27 AM  US  RENAL Result Date: 03/19/2024 CLINICAL DATA:  [id number] AKI (acute kidney injury) [id number]. EXAM: RENAL / URINARY TRACT ULTRASOUND COMPLETE COMPARISON:  None Available. FINDINGS: Right Kidney: Renal measurements: 4.9 x 5.6 x 9.8 cm = volume: 139.5 mL. Echogenicity within normal limits. No mass or hydronephrosis visualized. Left Kidney: Renal measurements: 4.6 x 5.1 x 10.6 cm = volume: 130.7 mL. Echogenicity within normal limits. No mass or hydronephrosis visualized. Bladder: Appears normal for degree of bladder distention. Other: None. IMPRESSION: Unremarkable renal ultrasound examination. Electronically Signed   By: Ree Molt M.D.   On: 03/19/2024 13:22   ECHOCARDIOGRAM LIMITED Result Date: 03/17/2024    ECHOCARDIOGRAM LIMITED REPORT   Patient Name:   SEELEY HISSONG Date of Exam: 03/17/2024 Medical Rec #:  [id number]      Height:       72.0 in Accession #:    [id number]     Weight:       268.0 lb Date of Birth:  1965/10/16      BSA:          2.413 m Patient Age:    58 years       BP:           130/80 mmHg Patient Gender: M              HR:           78 bpm. Exam Location:  Inpatient Procedure: Limited Echo, Limited Color Doppler, Cardiac Doppler and Intracardiac            Opacification Agent (Both Spectral and Color Flow Doppler were            utilized during procedure). Indications:    Chest Pain  History:         Patient has prior history of Echocardiogram examinations, most                 recent 02/26/2024. CHF, Defibrillator, CKD; Risk                 Factors:Diabetes, Dyslipidemia and Former Smoker.  Sonographer:    Koleen Popper RDCS Referring Phys: [id number] DARRYLE NED O'NEAL  Sonographer Comments: Patient is obese. Image acquisition challenging due to patient body habitus and Image acquisition challenging due to uncooperative patient. IMPRESSIONS  1. Left ventricular ejection fraction, by estimation, is 45 to 50%. The left ventricle has mildly decreased function. There is mild concentric left ventricular hypertrophy. Left ventricular diastolic function could not be evaluated.  2. Right ventricular systolic function is normal. The right ventricular size is  normal.  3. The mitral valve is normal in structure. Trivial mitral valve regurgitation.  4. The aortic valve is tricuspid. There is mild calcification of the aortic valve. Aortic valve sclerosis/calcification is present, without any evidence of aortic stenosis.  5. Aortic dilatation noted. There is borderline dilatation of the aortic root, measuring 42 mm.  6. The inferior vena cava is normal in size with greater than 50% respiratory variability, suggesting right atrial pressure of 3 mmHg. Conclusion(s)/Recommendation(s): Limited echo with poor sound wave transmission. FINDINGS  Left Ventricle: Left ventricular ejection fraction, by estimation, is 45 to 50%. The left ventricle has mildly decreased function. Definity  contrast agent was given IV to delineate the left ventricular endocardial borders. There is mild concentric left ventricular hypertrophy. Left ventricular diastolic function could not be evaluated. Right Ventricle: The right ventricular size is normal. Right ventricular systolic function is normal. Pericardium: There is no evidence of pericardial effusion. Mitral Valve: The mitral valve is normal in structure. Trivial mitral valve regurgitation.  Tricuspid Valve: The tricuspid valve is normal in structure. Tricuspid valve regurgitation is trivial. Aortic Valve: The aortic valve is tricuspid. There is mild calcification of the aortic valve. Aortic valve sclerosis/calcification is present, without any evidence of aortic stenosis. Pulmonic Valve: The pulmonic valve was not assessed. Aorta: Aortic dilatation noted. There is borderline dilatation of the aortic root, measuring 42 mm. Venous: The inferior vena cava is normal in size with greater than 50% respiratory variability, suggesting right atrial pressure of 3 mmHg. Additional Comments: Spectral Doppler performed. Color Doppler performed.  LEFT VENTRICLE PLAX 2D LVIDd:         7.30 cm   Diastology LVIDs:         4.70 cm   LV e' medial:    7.29 cm/s LV PW:         1.20 cm   LV E/e' medial:  7.9 LV IVS:        1.10 cm   LV e' lateral:   11.40 cm/s LVOT diam:     2.50 cm   LV E/e' lateral: 5.1 LV SV:         70 LV SV Index:   29 LVOT Area:     4.91 cm  RIGHT VENTRICLE             IVC RV S prime:     23.30 cm/s  IVC diam: 1.80 cm TAPSE (M-mode): 2.4 cm LEFT ATRIUM         Index LA diam:    3.70 cm 1.53 cm/m  AORTIC VALVE LVOT Vmax:   87.00 cm/s LVOT Vmean:  57.100 cm/s LVOT VTI:    0.142 m  AORTA Ao Root diam: 4.20 cm Ao Asc diam:  4.10 cm MITRAL VALVE MV Area (PHT): 3.31 cm    SHUNTS MV Decel Time: 229 msec    Systemic VTI:  0.14 m MV E velocity: 57.60 cm/s  Systemic Diam: 2.50 cm MV A velocity: 82.30 cm/s MV E/A ratio:  0.70 Toribio Fuel MD Electronically signed by Toribio Fuel MD Signature Date/Time: 03/17/2024/10:48:10 AM    Final    CT Chest Wo Contrast Result Date: 03/16/2024 CLINICAL DATA:  Right upper chest wall pain. No contrast due to low GFR. EXAM: CT CHEST WITHOUT CONTRAST TECHNIQUE: Multidetector CT imaging of the chest was performed following the standard protocol without IV contrast. RADIATION DOSE REDUCTION: This exam was performed according to the departmental dose-optimization  program which includes automated exposure control, adjustment of the mA  and/or kV according to patient size and/or use of iterative reconstruction technique. COMPARISON:  Chest radiograph 03/16/2024. Radionuclide perfusion scan 12/27/2023. CT chest 12/27/2023 FINDINGS: Cardiovascular: Cardiac pacemaker. Cardiac enlargement. No pericardial effusions. Normal caliber thoracic aorta. No significant vascular calcifications. Mediastinum/Nodes: Thyroid  gland is unremarkable. No significant lymphadenopathy in the chest. Esophagus is decompressed. Lungs/Pleura: Mild emphysematous changes in the lungs. Patchy airspace infiltrates in the left upper lung with linear infiltration or atelectasis in the left greater than right lung base. This is progressing since the prior study and may represent developing pneumonia superimposed upon chronic fibrosis and atelectasis. No pleural effusion or pneumothorax. Upper Abdomen: No acute abnormality. Bilateral adrenal gland nodules, 1.9 cm on the right and 2.4 cm on the left. No change in size since prior studies dating back to 09/17/2017. Long-term stability is consistent with benign etiology and no imaging follow-up is indicated. Musculoskeletal: Degenerative changes in the spine. No acute bony abnormalities. IMPRESSION: 1. Cardiac enlargement. 2. Linear infiltration or atelectasis in the lung bases, greater on the left, similar to prior study. Developing patchy airspace infiltrates in the left upper lung. This likely represents developing pneumonia superimposed upon chronic fibrosis and atelectasis. 3. Bilateral adrenal gland nodules demonstrate long-term stability, likely benign. No imaging follow-up is indicated. Electronically Signed   By: Elsie Gravely M.D.   On: 03/16/2024 20:29   DG Chest 2 View Result Date: 03/16/2024 CLINICAL DATA:  Chest pain. EXAM: CHEST - 2 VIEW COMPARISON:  02/25/2024 FINDINGS: Stable appearance of the left chest ICD. Cardiac silhouette appears to be  enlarged but stable. Prominent central vascular markings without overt pulmonary edema. No large pleural effusions. IMPRESSION: Cardiomegaly with prominent central vascular markings. Findings could represent vascular congestion. No overt pulmonary edema. Electronically Signed   By: Juliene Balder M.D.   On: 03/16/2024 16:28   CT Lumbar Spine Wo Contrast Result Date: 02/28/2024 CLINICAL DATA:  Low back pain radiating down both legs. Recent fall. EXAM: CT LUMBAR SPINE WITHOUT CONTRAST TECHNIQUE: Multidetector CT imaging of the lumbar spine was performed without intravenous contrast administration. Multiplanar CT image reconstructions were also generated. RADIATION DOSE REDUCTION: This exam was performed according to the departmental dose-optimization program which includes automated exposure control, adjustment of the mA and/or kV according to patient size and/or use of iterative reconstruction technique. COMPARISON:  05/27/2023 FINDINGS: Segmentation: 5 lumbar type vertebrae. Alignment: Normal. Vertebrae: Vertebral body heights are maintained. There is mild spondylosis throughout the lumbar spine. Mild facet arthropathy. No acute fracture. Paraspinal and other soft tissues: Negative. Disc levels: T12-L1 level demonstrates minimal broad-based disc bulge. L1-2 level demonstrates broad-based disc bulge. Vacuum disc phenomenon. Mild canal stenosis due to the disc disease, facet arthropathy minimal ligamentum flavum hypertrophy. Minimal bilateral neural foraminal narrowing left worse than right. L2-3 level demonstrates moderate broad-based disc bulge. Vacuum disc phenomenon. Mild-to-moderate canal stenosis which is multifactorial. Moderate bilateral neural foraminal narrowing. L3-4 level demonstrates mild broad-based disc bulge. Minimal left-sided neural foraminal narrowing. Borderline canal stenosis. L4-5 level demonstrates mild broad-based disc bulge. Minimal spinal canal stenosis. Mild bilateral neural foraminal  narrowing. L5-S1 level demonstrates minimal broad-based disc bulge. Minimal calcified plaque over the distal abdominal aorta. IMPRESSION: 1. No acute fracture or subluxation of the lumbar spine. 2. 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 Mild multilevel canal stenosis and multilevel neural foraminal narrowing as described above. Consider MRI for better evaluation. Electronically Signed   By: Toribio Agreste M.D.   On: 02/28/2024 14:37   ECHOCARDIOGRAM LIMITED Result Date: 02/26/2024  ECHOCARDIOGRAM LIMITED REPORT   Patient Name:   DEEPAK BLESS Date of Exam: 02/26/2024 Medical Rec #:  [id number]      Height:       72.0 in Accession #:    [id number]     Weight:       274.7 lb Date of Birth:  Oct 07, 1965      BSA:          2.438 m Patient Age:    58 years       BP:           111/61 mmHg Patient Gender: M              HR:           69 bpm. Exam Location:  Inpatient Procedure: Limited Echo, Limited Color Doppler and Cardiac Doppler (Both            Spectral and Color Flow Doppler were utilized during procedure). Indications:    Congestive Heart Failure I50.9  History:        Patient has prior history of Echocardiogram examinations, most                 recent 12/22/2023. CHF, Defibrillator, CKD; Risk                 Factors:Diabetes, Dyslipidemia and Former Smoker.  Sonographer:    Koleen Popper RDCS Referring Phys: [phone] AMY D CLEGG  Sonographer Comments: Patient is obese. Image acquisition challenging due to patient body habitus. IMPRESSIONS  1. Only off axis foreshortened apical views available unable to fully evaluate RWMA;s. Left ventricular ejection fraction, by estimation, is 45 to 50%. The left ventricle has mildly decreased function. The left ventricular internal cavity size was mildly dilated. There is severe left ventricular hypertrophy. Left ventricular diastolic parameters are consistent with Grade I diastolic dysfunction (impaired relaxation).  2. Right ventricular  systolic function is normal. The right ventricular size is normal.  3. Left atrial size was mildly dilated.  4. The mitral valve is abnormal. Trivial mitral valve regurgitation.  5. The inferior vena cava is dilated in size with >50% respiratory variability, suggesting right atrial pressure of 8 mmHg. FINDINGS  Left Ventricle: Only off axis foreshortened apical views available unable to fully evaluate RWMA;s. Left ventricular ejection fraction, by estimation, is 45 to 50%. The left ventricle has mildly decreased function. The left ventricular internal cavity size was mildly dilated. There is severe left ventricular hypertrophy. Left ventricular diastolic parameters are consistent with Grade I diastolic dysfunction (impaired relaxation). Right Ventricle: The right ventricular size is normal. Right ventricular systolic function is normal. Left Atrium: Left atrial size was mildly dilated. Pericardium: There is no evidence of pericardial effusion. Mitral Valve: The mitral valve is abnormal. There is mild thickening of the mitral valve leaflet(s). Trivial mitral valve regurgitation. Aorta: The aortic root is normal in size and structure. Venous: The inferior vena cava is dilated in size with greater than 50% respiratory variability, suggesting right atrial pressure of 8 mmHg. Additional Comments: Spectral Doppler performed. Color Doppler performed.  LEFT VENTRICLE PLAX 2D LVIDd:         6.60 cm   Diastology LVIDs:         4.70 cm   LV e' medial:    5.77 cm/s LV PW:         1.50 cm   LV E/e' medial:  9.2 LV IVS:        1.60 cm   LV  e' lateral:   5.87 cm/s LVOT diam:     2.50 cm   LV E/e' lateral: 9.0 LVOT Area:     4.91 cm  IVC IVC diam: 2.20 cm LEFT ATRIUM         Index LA diam:    4.20 cm 1.72 cm/m   AORTA Ao Root diam: 3.50 cm MITRAL VALVE MV Area (PHT): 3.42 cm    SHUNTS MV Decel Time: 222 msec    Systemic Diam: 2.50 cm MV E velocity: 52.90 cm/s MV A velocity: 74.90 cm/s MV E/A ratio:  0.71 Maude Emmer MD  Electronically signed by Maude Emmer MD Signature Date/Time: 02/26/2024/9:37:40 AM    Final    DG Shoulder Right Result Date: 02/25/2024 CLINICAL DATA:  Right shoulder pain after fall last night. EXAM: RIGHT SHOULDER - 2+ VIEW COMPARISON:  None Available. FINDINGS: There is no evidence of fracture or dislocation. Mild degenerative disease is seen involving the right glenohumeral joint. Soft tissues are unremarkable. 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 No acute abnormality seen. Electronically Signed   By: Lynwood [name] Raddle M.D.   On: 02/25/2024 12:09   DG Chest 1 View Result Date: 02/25/2024 CLINICAL DATA:  Chest pain, syncope. EXAM: CHEST  1 VIEW COMPARISON:  December 25, 2023. FINDINGS: Stable cardiomediastinal silhouette. Left-sided defibrillator is unchanged. Hypoinflation of the lungs is noted with minimal bibasilar subsegmental atelectasis. Bony thorax is unremarkable. IMPRESSION: Hypoinflation of the lungs with minimal bibasilar subsegmental atelectasis. Electronically Signed   By: Lynwood [name] Raddle M.D.   On: 02/25/2024 12:06   DG Lumbar Spine Complete Result Date: 02/25/2024 CLINICAL DATA:  Lower back pain after fall last night. EXAM: LUMBAR SPINE - COMPLETE 4+ VIEW COMPARISON:  May 27, 2023. FINDINGS: No fracture or spondylolisthesis is noted. Moderate degenerative disc disease is noted at L2-3. Anterior osteophyte formation is noted at multiple other levels of the lumbar spine. IMPRESSION: Multilevel degenerative changes as described above. No acute abnormality seen. Electronically Signed   By: Lynwood [name] Raddle M.D.   On: 02/25/2024 12:05    Microbiology: Results for orders placed or performed during the hospital encounter of 03/16/24  Resp panel by RT-PCR (RSV, Flu A&B, Covid) Anterior Nasal Swab     Status: None   Collection Time: 03/16/24  3:32 PM   Specimen: Anterior Nasal Swab  Result Value Ref Range Status   SARS Coronavirus 2 by RT PCR NEGATIVE  NEGATIVE Final   Influenza A by PCR NEGATIVE NEGATIVE Final   Influenza B by PCR NEGATIVE NEGATIVE Final    Comment: (NOTE) The Xpert Xpress SARS-CoV-2/FLU/RSV plus assay is intended as an aid in the diagnosis of influenza from Nasopharyngeal swab specimens and should not be used as a sole basis for treatment. Nasal washings and aspirates are unacceptable for Xpert Xpress SARS-CoV-2/FLU/RSV testing.  Fact Sheet for Patients: BloggerCourse.com  Fact Sheet for Healthcare Providers: SeriousBroker.it  This test is not yet approved or cleared by the [place]  FDA and has been authorized for detection and/or diagnosis of SARS-CoV-2 by FDA under an Emergency Use Authorization (EUA). This EUA will remain in effect (meaning this test can be used) for the duration of the COVID-19 declaration under Section 564(b)(1) of the Act, 21 U.S.C. section 360bbb-3(b)(1), unless the authorization is terminated or revoked.     Resp Syncytial Virus by PCR NEGATIVE NEGATIVE Final    Comment: (NOTE) Fact Sheet for Patients: BloggerCourse.com  Fact Sheet for Healthcare Providers: SeriousBroker.it  This test  is not yet approved or cleared by the [place]  FDA and has been authorized for detection and/or diagnosis of SARS-CoV-2 by FDA under an Emergency Use Authorization (EUA). This EUA will remain in effect (meaning this test can be used) for the duration of the COVID-19 declaration under Section 564(b)(1) of the Act, 21 U.S.C. section 360bbb-3(b)(1), unless the authorization is terminated or revoked.  Performed at [facility], [address], [place], [place] [address]    *Note: Due to a large number of results and/or encounters for the requested time period, some results have not been displayed. A complete set of results can be found in Results Review.    Labs: CBC: Recent Labs   Lab 03/19/24 0330 03/20/24 0234  WBC 8.4 10.9*  HGB 10.8* 11.1*  HCT 32.2* 32.7*  MCV 88.2 87.7  PLT 292 311   Basic Metabolic Panel: Recent Labs  Lab 03/20/24 0234 03/21/24 0331 03/22/24 0256 03/23/24 0247 03/24/24 0249  NA 137 136 135 137 136  K 3.4* 3.7 3.5 3.3* 4.0  CL 94* 96* 94* 95* 95*  CO2 30 27 28 31 28   GLUCOSE 247* 258* 228* 214* 232*  BUN 54* 58* 62* 60* 59*  CREATININE 2.61* 2.47* 2.64* 2.58* 2.51*  CALCIUM  9.4 9.3 9.3 9.0 9.2   Liver Function Tests: Recent Labs  Lab 03/19/24 0330 03/20/24 0234  AST 18 16  ALT 22 21  ALKPHOS 62 56  BILITOT 0.8 0.6  PROT 7.5 7.2  ALBUMIN  3.0* 3.0*   CBG: Recent Labs  Lab 03/23/24 1129 03/23/24 1524 03/23/24 2131 03/24/24 0643 03/24/24 1133  GLUCAP 172* 375* 284* 139* 167*    Discharge time spent:  38 minutes.  Signed: Drue ONEIDA Potter, MD [facility] 03/24/2024

## 2024-03-24 NOTE — Plan of Care (Signed)

## 2024-03-24 NOTE — Progress Notes (Deleted)
 Patient ID: [name], male   DOB: 21-Jan-1966, 58 y.o.   MRN: [id number]  HPI: [name] is a 58 y.o.-year-old male, initially referred by his PCP, Dr. Vicci, returning for follow-up for DM2, dx in 2010-15, insulin -dependent since 2023, uncontrolled, with complications (nonischemic cardiomyopathy, CHF with reduced ejection fraction, history of ventricular tachycardia, history of cardiac arrest, s/p ICD placement, stage IIIb CKD).  Last visit 5 months ago.  Interim history: No increased urination, blurry vision, nausea, chest pain.  He was previously drinking sweet tea but switched to cranberry juice before last visit.  He tells me that he stopped this, but would like to start it back. He continues to have significant shortness of breath that hinders his moving around.  He also has a cough, after he choked on his food yesterday.  No fever.  Reviewed HbA1c: Lab Results  Component Value Date   HGBA1C 8.5 (A) 12/15/2023   HGBA1C 9.3 (H) 10/08/2023   HGBA1C 8.9 (A) 07/09/2023   HGBA1C 9.3 (H) 04/03/2023   HGBA1C 9.1 (A) 02/13/2023   HGBA1C 8.9 12/12/2022   HGBA1C 7.9 (A) 09/18/2022   HGBA1C 10.2 (H) 05/04/2022   HGBA1C 15.0 (A) 01/30/2022   HGBA1C 13.0 (H) 08/26/2021   Pt is on a regimen of: - Farxiga  10 mg before breakfast >> stopped 09/2023 when admitted with ARF - Lantus  22 >> ...38 >> 45 >> after d/c: 34 units at bedtime - Novolog  12 >> .SABRASABRA 30-34 >> 30-38 units >> now, after d/c: 32 units 2x a day, before meals He reported diarrhea after increased Trulicity  >> hypokalemia >> cardiac arrest - was in ICU for 16 days, had heart surgery >> now PTSD.  Pt checks his sugars >4x a day  - with the CGM receiver:  Previously:  Previously:  Lowest sugar was 69 >> 58 >> 70; he has hypoglycemia awareness at 70.  Highest sugar was 380 >> >400 >> >400.  Glucometer: Accu-Chek guide  Pt's meals are: - Breakfast: muffins, 3 eggs - Lunch: skips - Dinner: salad from [employer] - tuna,  onions, olives, crackers + cookie - Snacks: Previously also drinking regular sodas and sweet tea, which she stopped.  - + CKD - sees nephrology ([facility]), last BUN/creatinine:  Lab Results  Component Value Date   BUN 59 (H) 03/24/2024   BUN 60 (H) 03/23/2024   CREATININE 2.51 (H) 03/24/2024   CREATININE 2.58 (H) 03/23/2024  Off Farxiga  10 mg daily, off Entresto .   -+ HL; last set of lipids: Lab Results  Component Value Date   CHOL 166 02/13/2023   HDL 44 02/13/2023   LDLCALC 76 02/13/2023   TRIG 283 (H) 02/13/2023   CHOLHDL 3.8 02/13/2023  On Lipitor 10 mg daily.  - last eye exam was on 06/2023. No DR  repotedly. Sees Dr. Octavia.  - no numbness and tingling in his feet. On Neurontin  300 mg at bedtime.  Last foot exam: by Dr. Lamount 08/20/2023.  Pt has FH of DM in mother and father.  He also has a history of HTN, kidney stones, GERD, IBS, gout, chronic low back pain (spondylosis and spinal stenosis), Since 04/2022, after his cardiac arrest, he has been SOB and has gained 30 lbs.  He also has anxiety and PTSD.  He has insomnia.  His psychiatrist started him on trazodone , but this is not working. He is on amiodarone , ASA 81.  ROS: + See HPI  Past Medical History:  Diagnosis Date   AICD (automatic cardioverter/defibrillator)  present    Chronic bronchitis (HCC)    get it most q yr (12/23/2013)   Chronic systolic (congestive) heart failure (HCC)    Fracture of left humerus    a. 07/2013.   GERD (gastroesophageal reflux disease)    not at present time   Heart murmur    born w/it    History of renal calculi    Hypertension    Kidney stones    NICM (nonischemic cardiomyopathy) (HCC)    a. 07/2013 Echo: EF 20-25%, diff HK, Gr2 DD, mild MR, mod dil LA/RA. 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    Shockable heart rhythm detected by automated external defibrillator    Syncope    Type II diabetes  mellitus (HCC) 2006   Past Surgical History:  Procedure Laterality Date   CARDIAC CATHETERIZATION  08/10/13   CHOLECYSTECTOMY  ~ 2010   IMPLANTABLE CARDIOVERTER DEFIBRILLATOR IMPLANT  12/23/2013   STJ Fortify ICD implanted by Dr Kelsie for cardiomyopathy and syncope   IMPLANTABLE CARDIOVERTER DEFIBRILLATOR IMPLANT N/A 12/23/2013   Procedure: IMPLANTABLE CARDIOVERTER DEFIBRILLATOR IMPLANT;  Surgeon: Lynwood JONETTA Kelsie, MD;  Location: [facility] CATH LAB;  Service: Cardiovascular;  Laterality: N/A;   INGUINAL HERNIA REPAIR Left 03/01/2018   Procedure: OPEN REPAIR OF LEFT INGUINAL HERNIA WITH MESH;  Surgeon: Tanda Locus, MD;  Location: [facility] ORS;  Service: General;  Laterality: Left;   LEAD REVISION/REPAIR N/A 05/15/2022   Procedure: LEAD REVISION/REPAIR;  Surgeon: [name] Ole DASEN, MD;  Location[facility] INVASIVE CV LAB;  Service: Cardiovascular;  Laterality: N/A;   LEFT HEART CATHETERIZATION WITH CORONARY ANGIOGRAM N/A 08/10/2013   Procedure: LEFT HEART CATHETERIZATION WITH CORONARY ANGIOGRAM;  Surgeon: Candyce GORMAN Reek, MD;  Location: [facility] CATH LAB;  Service: Cardiovascular;  Laterality: N/A;   ORIF HUMERUS FRACTURE Left 08/12/2013   Procedure: OPEN REDUCTION INTERNAL FIXATION (ORIF) HUMERAL SHAFT FRACTURE;  Surgeon: Jerona LULLA Sage, MD;  Location: [facility] OR;  Service: Orthopedics;  Laterality: Left;  Open Reduction Internal Fixation Left Humerus   RIGHT/LEFT HEART CATH AND CORONARY ANGIOGRAPHY N/A 05/12/2022   Procedure: RIGHT/LEFT HEART CATH AND CORONARY ANGIOGRAPHY;  Surgeon: Gardenia Led, DO;  Location: [facility] INVASIVE CV LAB;  Service: Cardiovascular;  Laterality: N/A;   URETEROSCOPY     laser for kidney stones   Social History   Socioeconomic History   Marital status: Single    Spouse name: Not on file   Number of children: Not on file   Years of education: Not on file   Highest education level: Not on file  Occupational History   Occupation: Disabled  Tobacco Use   Smoking status: Former    Current  packs/day: 0.00    Average packs/day: 1 pack/day for 28.0 years (28.0 ttl pk-yrs)    Types: Cigarettes    Start date: 06/03/1986    Quit date: 06/03/2014    Years since quitting: 9.8   Smokeless tobacco: Never  Vaping Use   Vaping status: Never Used  Substance and Sexual Activity   Alcohol use: Not Currently   Drug use: Yes    Types: Marijuana    Comment: occasionally;   Sexual activity: Yes  Other Topics Concern   Not on file  Social History Narrative   Pt lives with his brother    Social Drivers of Health   Financial Resource Strain: Low Risk  (05/25/2023)   Overall Financial Resource Strain (CARDIA)    Difficulty  of Paying Living Expenses: Not hard at all  Recent Concern: Financial Resource Strain - High Risk (03/24/2023)   Overall Financial Resource Strain (CARDIA)    Difficulty of Paying Living Expenses: Hard  Food Insecurity: No Food Insecurity (03/17/2024)   Hunger Vital Sign    Worried About Running Out of Food in the Last Year: Never true    Ran Out of Food in the Last Year: Never true  Transportation Needs: No Transportation Needs (03/17/2024)   PRAPARE - Administrator, Civil Service (Medical): No    Lack of Transportation (Non-Medical): No  Physical Activity: Inactive (05/25/2023)   Exercise Vital Sign    Days of Exercise per Week: 0 days    Minutes of Exercise per Session: 0 min  Stress: Stress Concern Present (05/25/2023)   [employer] of Occupational Health - Occupational Stress Questionnaire    Feeling of Stress : Rather much  Social Connections: Moderately Isolated (03/17/2024)   Social Connection and Isolation Panel    Frequency of Communication with Friends and Family: More than three times a week    Frequency of Social Gatherings with Friends and Family: More than three times a week    Attends Religious Services: More than 4 times per year    Active Member of [employer] or Organizations: No    Attends Banker Meetings: Never     Marital Status: Never married  Intimate Partner Violence: Not At Risk (03/17/2024)   Humiliation, Afraid, Rape, and Kick questionnaire    Fear of Current or Ex-Partner: No    Emotionally Abused: No    Physically Abused: No    Sexually Abused: No   Current Facility-Administered Medications on File Prior to Visit  Medication Dose Route Frequency Provider Last Rate Last Admin   acetaminophen  (TYLENOL ) tablet 1,000 mg  1,000 mg Oral Once Franklyn Sid SAILOR, MD       allopurinol (ZYLOPRIM) tablet 100 mg  100 mg Oral Daily Fairy Frames, MD   100 mg at 03/24/24 9147   amiodarone  (PACERONE ) tablet 200 mg  200 mg Oral Daily Georgina Basket, MD   200 mg at 03/24/24 9147   atorvastatin  (LIPITOR) tablet 10 mg  10 mg Oral Daily Georgina Basket, MD   10 mg at 03/24/24 9145   clonazePAM  (KLONOPIN ) tablet 0.5 mg  0.5 mg Oral BID PRN Keturah Carrier, MD   0.5 mg at 03/24/24 9147   fluticasone  furoate-vilanterol (BREO ELLIPTA ) 200-25 MCG/ACT 1 puff  1 puff Inhalation Daily Georgina Basket, MD   1 puff at 03/24/24 9096   gabapentin  (NEURONTIN ) capsule 300 mg  300 mg Oral QHS Georgina Basket, MD   300 mg at 03/23/24 2129   hydrALAZINE  (APRESOLINE ) tablet 10 mg  10 mg Oral Q8H Fairy Frames, MD   10 mg at 03/23/24 2129   HYDROcodone -acetaminophen  (NORCO/VICODIN) 5-325 MG per tablet 1-2 tablet  1-2 tablet Oral Q4H PRN Georgina Basket, MD   2 tablet at 03/22/24 2249   HYDROmorphone  (DILAUDID ) injection 0.5 mg  0.5 mg Intravenous Q4H PRN Joseph, Preetha, MD   0.5 mg at 03/18/24 1024   insulin  aspart (novoLOG ) injection 0-15 Units  0-15 Units Subcutaneous TID WC Georgina Basket, MD   2 Units at 03/24/24 0650   insulin  aspart (novoLOG ) injection 8 Units  8 Units Subcutaneous TID WC Joseph, Preetha, MD   8 Units at 03/24/24 0854   insulin  glargine-yfgn (SEMGLEE ) injection 50 Units  50 Units Subcutaneous Daily Fairy Frames, MD   50  Units at 03/24/24 0852   isosorbide mononitrate (IMDUR) 24 hr tablet 15 mg  15 mg Oral Daily  Barbaraann Darryle Ned, MD   15 mg at 03/24/24 0858   lidocaine  (LIDODERM ) 5 % 1 patch  1 patch Transdermal Q24H Naasz, Hayley N, MD       methocarbamol  (ROBAXIN ) tablet 500 mg  500 mg Oral TID Joseph, Preetha, MD   500 mg at 03/24/24 9147   metoprolol  succinate (TOPROL -XL) 24 hr tablet 12.5 mg  12.5 mg Oral Daily Georgina Basket, MD   12.5 mg at 03/24/24 9147   nystatin  (MYCOSTATIN /NYSTOP ) topical powder   Topical BID Georgina Basket, MD   Given at 03/24/24 9096   pantoprazole  (PROTONIX ) EC tablet 40 mg  40 mg Oral Daily Georgina Basket, MD   40 mg at 03/24/24 9147   prednisoLONE acetate (PRED FORTE) 1 % ophthalmic suspension 1 drop  1 drop Left Eye QHS Moore, Willie, MD   1 drop at 03/23/24 2128   predniSONE  (DELTASONE ) tablet 40 mg  40 mg Oral Q breakfast Joseph, Preetha, MD   40 mg at 03/24/24 9148   rivaroxaban  (XARELTO ) tablet 20 mg  20 mg Oral Q supper Georgina Basket, MD   20 mg at 03/23/24 1611   sertraline  (ZOLOFT ) tablet 75 mg  75 mg Oral q AM Georgina Basket, MD   75 mg at 03/24/24 9350   tamsulosin  (FLOMAX ) capsule 0.8 mg  0.8 mg Oral Daily Moore, Willie, MD   0.8 mg at 03/24/24 9148   Current Outpatient Medications on File Prior to Visit  Medication Sig Dispense Refill   acetaminophen -codeine  (TYLENOL  #3) 300-30 MG tablet Take 1 tablet by mouth every 8 (eight) hours as needed for moderate pain (pain score 4-6).     amiodarone  (PACERONE ) 200 MG tablet Take 2 tablets (400 mg) twice daily for 2 weeks. Then on October 5, decrease to 1 tablet (200 mg) once daily. 40 tablet 11   atorvastatin  (LIPITOR) 10 MG tablet Take 1 tablet (10 mg total) by mouth daily. 90 tablet 2   benzonatate  (TESSALON ) 200 MG capsule Take 200 mg by mouth 2 (two) times daily as needed for cough.     cetirizine  (EQ ALLERGY RELIEF, CETIRIZINE ,) 10 MG tablet Take 1 tablet (10 mg total) by mouth daily. 90 tablet 1   cholecalciferol  (VITAMIN D3) 25 MCG (1000 UNIT) tablet Take 1,000 Units by mouth in the morning.     clonazePAM   (KLONOPIN ) 0.5 MG tablet Take 1 tablet (0.5 mg total) by mouth 2 (two) times daily as needed for anxiety. (Patient taking differently: Take 0.5 mg by mouth 2 (two) times daily.) 60 tablet 1   Clotrimazole  1 % OINT Apply 1 Application topically 2 (two) times daily as needed (irritation).     colchicine  0.6 MG tablet Take 0.5 tablets (0.3 mg total) by mouth every Monday, Wednesday, and Friday.     Continuous Glucose Sensor (FREESTYLE LIBRE 2 PLUS SENSOR) MISC Change sensor every 15 days. 2 each 11   EPINEPHrine  0.3 mg/0.3 mL IJ SOAJ injection Inject 0.3 mg into the muscle as needed. 1 each 1   fluticasone  (FLONASE ) 50 MCG/ACT nasal spray Place 2 sprays into both nostrils daily. (Patient taking differently: Place 2 sprays into both nostrils daily as needed for allergies.) 16 g 6   gabapentin  (NEURONTIN ) 300 MG capsule Take 1 capsule (300 mg total) by mouth at bedtime. 90 capsule 1   HYDROcodone -acetaminophen  (NORCO/VICODIN) 5-325 MG tablet Take 1-2 tablets by mouth every  4 (four) hours as needed for moderate pain (pain score 4-6) or severe pain (pain score 7-10). 20 tablet 0   insulin  aspart (NOVOLOG  FLEXPEN) 100 UNIT/ML FlexPen Inject 22 Units into the skin 2 (two) times daily.     insulin  glargine (LANTUS  SOLOSTAR) 100 UNIT/ML Solostar Pen Inject 30 Units into the skin at bedtime.     Insulin  Pen Needle (TECHLITE PEN NEEDLES) 32G X 4 MM MISC Use to inject insulin  into the skin 3 times per day. 100 each 0   latanoprost (XALATAN) 0.005 % ophthalmic solution Place 1 drop into both eyes at bedtime. (Patient not taking: Reported on 03/17/2024)     magnesium  oxide (MAG-OX) 400 MG tablet Take 1 tablet (400 mg total) by mouth daily. 90 tablet 3   metolazone  (ZAROXOLYN ) 5 MG tablet Take 1 tablet (5 mg total) by mouth once a week. 12 tablet 2   metoprolol  succinate (TOPROL -XL) 25 MG 24 hr tablet Take 0.5 tablets (12.5 mg total) by mouth daily. 45 tablet 3   nystatin  (MYCOSTATIN /NYSTOP ) powder Apply 1 Application  topically 3 (three) times daily. 60 g 2   pantoprazole  (PROTONIX ) 40 MG tablet Take 1 tablet (40 mg total) by mouth daily. 90 tablet 1   potassium chloride  SA (KLOR-CON  M) 20 MEQ tablet Take 2 tablets (40 mEq total) by mouth 2 (two) times daily. Take an extra 20 mEq when you take Metolazone . 120 tablet 11   prednisoLONE acetate (PRED FORTE) 1 % ophthalmic suspension Place 1 drop into the left eye at bedtime.     rivaroxaban  (XARELTO ) 20 MG TABS tablet Take 1 tablet (20 mg total) by mouth daily with supper. 30 tablet 2   sertraline  (ZOLOFT ) 50 MG tablet Take 1.5 tablets (75 mg total) by mouth in the morning. 45 tablet 2   SYMBICORT  160-4.5 MCG/ACT inhaler Inhale 2 puffs into the lungs in the morning and at bedtime.     tamsulosin  (FLOMAX ) 0.4 MG CAPS capsule Take 2 capsules (0.8 mg total) by mouth daily. 180 capsule 3   torsemide  (DEMADEX ) 20 MG tablet Take 2 tablets (40 mg total) by mouth daily. 180 tablet 3   Allergies  Allergen Reactions   Bee Venom Anaphylaxis   Trulicity  [Dulaglutide ] Anaphylaxis and Diarrhea    Extra dose of Trulicity  caused cardiac arrest. Patient experience onset of diarrhea with using this medication - cleared after med stopped.   Family History  Problem Relation Age of Onset   Diabetes Father    Arthritis Father    Hypertension Father    Diabetes Mother    Arthritis Mother    Hypertension Mother    Hypertension Brother    PE: There were no vitals taken for this visit. Wt Readings from Last 20 Encounters:  03/23/24 271 lb 2.7 oz (123 kg)  03/10/24 276 lb (125.2 kg)  03/01/24 279 lb 1.6 oz (126.6 kg)  02/16/24 282 lb (127.9 kg)  02/05/24 285 lb (129.3 kg)  01/18/24 283 lb 12.8 oz (128.7 kg)  01/05/24 286 lb 9.6 oz (130 kg)  12/15/23 289 lb 3.2 oz (131.2 kg)  11/16/23 282 lb (127.9 kg)  11/06/23 292 lb 12.8 oz (132.8 kg)  10/23/23 290 lb 12.8 oz (131.9 kg)  10/13/23 280 lb 1.6 oz (127.1 kg)  09/07/23 283 lb 15.2 oz (128.8 kg)  09/07/23 284 lb (128.8  kg)  08/26/23 284 lb (128.8 kg)  08/24/23 295 lb 3.2 oz (133.9 kg)  08/13/23 297 lb 9.6 oz (135 kg)  08/07/23 293  lb 9.6 oz (133.2 kg)  07/30/23 296 lb 2 oz (134.3 kg)  07/09/23 299 lb 3.2 oz (135.7 kg)   Constitutional: overweight, in NAD, short of breath, has difficulty moving to examiner's table Eyes:  EOMI, no exophthalmos ENT: no neck masses, no cervical lymphadenopathy Cardiovascular: RRR, No MRG Respiratory: CTA B Musculoskeletal: no deformities Skin:no rashes Neurological: no tremor with outstretched hands  ASSESSMENT: 1. DM2, insulin -dependent, uncontrolled, with long-term complications: - NICM - CHF with reduced ejection fraction-20 to 25% 08/2022 - history of VT + cardiac arrest 04/2022 (after developing diarrhea from increasing Trulicity  dose), s/p ICD placement - stage IIIb CKD  PLAN:  1. Patient with longstanding, uncontrolled, type 2 diabetes, basal-bolus insulin  regimen with still poor control.  At last visit, HbA1c was higher, at 8.9% but since then he had another HbA1c obtained 2 months ago and this was even higher, at 9.3%.  At last visit, sugars were very high, almost entirely above target, widely fluctuating during the night and then improving during the day.  The sugars increased significantly after 9 PM and peaking around 5 AM.  Upon questioning, he was on prednisone , which he was taking at night.  I advised him to move this to the morning.  We increased both of his insulin  doses. -Of note, he was intolerant to GLP-1 receptor agonist in the past. CGM interpretation: -At today's visit, we reviewed his CGM downloads: It appears that *** of values are in target range (goal >70%), while *** are higher than 180 (goal <25%), and *** are lower than 70 (goal <4%).  The calculated average blood sugar is ***.  The projected HbA1c for the next 3 months (GMI) is ***. -Reviewing the CGM trends, ***  -Reviewing the CGM trends, sugars appear to be fluctuating above the target  range, but they increase significantly after dinner.  He tells me that he is forgetting his insulin  before meals and takes it after occasionally.  As a consequence, he may drop his blood sugars quite abruptly after meals.  We discussed about trying to always bolus insulin  15 minutes before the meals but if he had to take it afterwards, to only take half of the amount to avoid late postprandial hypoglycemia.  He is also taking a lower dose of Lantus  compared to last visit, after his hospitalization, and we will increase this today.  He is not very good dose of NovoLog  based on the size of his meals and I strongly advised him to do so.  I also strongly advised him to not start back any sweet drinks. -At last visit, he was also on Farxiga , but he tells me that this was stopped in the hospital when he was admitted with acute renal failure and a creatinine of 4.5.  He was not able to see his nephrologist since he was discharged and I advised him to check with him if he could restart Farxiga . - I suggested to:  Patient Instructions  Please use the following regimen: - Lantus  40 units at bedtime - Novolog  30-38 units 2x a day, 15 min before meals  If you have to take the NovoLog  after a meal, take only half of the amount.  Please check with your kidney doctor if you can go back on Farxiga .  Please return for another visit in 3 months.   - we checked his HbA1c: 7%  - advised to check sugars at different times of the day - 4x a day, rotating check times - advised for yearly  eye exams >> he is UTD - return to clinic in 3 months  2. HL - Latest lipid panel was reviewed from 02/2023: Triglycerides and LDL elevated: Lab Results  Component Value Date   CHOL 166 02/13/2023   HDL 44 02/13/2023   LDLCALC 76 02/13/2023   TRIG 283 (H) 02/13/2023   CHOLHDL 3.8 02/13/2023  -He continues on Lipitor 10 mg daily without side effects. - he is due    Lela Fendt, MD PhD [facility]

## 2024-03-25 ENCOUNTER — Telehealth: Payer: Self-pay

## 2024-03-25 NOTE — Transitions of Care (Post Inpatient/ED Visit) (Signed)
   03/25/2024  Name: JOSE CORVIN MRN: [id number] DOB: 16-Jun-1965  Today's TOC FU Call Status: Today's TOC FU Call Status:: Unsuccessful Call (1st Attempt) Unsuccessful Call (1st Attempt) Date: 03/25/24  Attempted to reach the patient regarding the most recent Inpatient/ED visit.  Follow Up Plan: Additional outreach attempts will be made to reach the patient to complete the Transitions of Care (Post Inpatient/ED visit) call.  Richerd Fish, RN, BSN, CCM [facility], [facility] RN Care Manager Direct Dial: [phone]

## 2024-03-28 ENCOUNTER — Ambulatory Visit: Attending: Pulmonary Disease | Admitting: Pulmonary Disease

## 2024-03-28 ENCOUNTER — Encounter: Payer: Self-pay | Admitting: Pulmonary Disease

## 2024-03-28 VITALS — BP 101/62 | HR 72 | Ht 73.0 in | Wt 275.0 lb

## 2024-03-28 DIAGNOSIS — Z95810 Presence of automatic (implantable) cardiac defibrillator: Secondary | ICD-10-CM | POA: Diagnosis not present

## 2024-03-28 DIAGNOSIS — Z79899 Other long term (current) drug therapy: Secondary | ICD-10-CM | POA: Insufficient documentation

## 2024-03-28 DIAGNOSIS — I5022 Chronic systolic (congestive) heart failure: Secondary | ICD-10-CM | POA: Diagnosis not present

## 2024-03-28 DIAGNOSIS — I428 Other cardiomyopathies: Secondary | ICD-10-CM | POA: Insufficient documentation

## 2024-03-28 DIAGNOSIS — I472 Ventricular tachycardia, unspecified: Secondary | ICD-10-CM | POA: Diagnosis not present

## 2024-03-28 LAB — CUP PACEART INCLINIC DEVICE CHECK
Date Time Interrogation Session: 20251020165925
Implantable Lead Connection Status: 753985
Implantable Lead Connection Status: 753985
Implantable Lead Implant Date: 20150717
Implantable Lead Implant Date: 20231207
Implantable Lead Location: 753859
Implantable Lead Location: 753860
Implantable Pulse Generator Implant Date: 20231207
Pulse Gen Serial Number: 810051574

## 2024-03-28 NOTE — Patient Instructions (Signed)
 Medication Instructions:  Your physician recommends that you continue on your current medications as directed. Please refer to the Current Medication list given to you today.  *If you need a refill on your cardiac medications before your next appointment, please call your pharmacy*  Lab Work: CMP, TSH, CBC-TODAY If you have labs (blood work) drawn today and your tests are completely normal, you will receive your results only by: MyChart Message (if you have MyChart) OR A paper copy in the mail If you have any lab test that is abnormal or we need to change your treatment, we will call you to review the results.  Follow-Up: At [facility], you and your health needs are our priority.  As part of our continuing mission to provide you with exceptional heart care, our providers are all part of one team.  This team includes your primary Cardiologist (physician) and Advanced Practice Providers or APPs (Physician Assistants and Nurse Practitioners) who all work together to provide you with the care you need, when you need it.  Your next appointment:   6 month(s)  Provider:   Ole Holts, MD or Daphne Barrack, NP

## 2024-03-29 ENCOUNTER — Telehealth: Payer: Self-pay

## 2024-03-29 ENCOUNTER — Ambulatory Visit: Payer: Self-pay | Admitting: Pulmonary Disease

## 2024-03-29 LAB — CBC
Hematocrit: 38.5 % (ref 37.5–51.0)
Hemoglobin: 13 g/dL (ref 13.0–17.7)
MCH: 30.2 pg (ref 26.6–33.0)
MCHC: 33.8 g/dL (ref 31.5–35.7)
MCV: 89 fL (ref 79–97)
Platelets: 409 x10E3/uL (ref 150–450)
RBC: 4.31 x10E6/uL (ref 4.14–5.80)
RDW: 13.2 % (ref 11.6–15.4)
WBC: 8.3 x10E3/uL (ref 3.4–10.8)

## 2024-03-29 LAB — COMPREHENSIVE METABOLIC PANEL WITH GFR
ALT: 24 IU/L (ref 0–44)
AST: 18 IU/L (ref 0–40)
Albumin: 4 g/dL (ref 3.8–4.9)
Alkaline Phosphatase: 90 IU/L (ref 47–123)
BUN/Creatinine Ratio: 14 (ref 9–20)
BUN: 33 mg/dL — AB (ref 6–24)
Bilirubin Total: 0.5 mg/dL (ref 0.0–1.2)
CO2: 21 mmol/L (ref 20–29)
Calcium: 9.1 mg/dL (ref 8.7–10.2)
Chloride: 99 mmol/L (ref 96–106)
Creatinine, Ser: 2.4 mg/dL — AB (ref 0.76–1.27)
Globulin, Total: 3.1 g/dL (ref 1.5–4.5)
Glucose: 165 mg/dL — AB (ref 70–99)
Potassium: 4.6 mmol/L (ref 3.5–5.2)
Sodium: 140 mmol/L (ref 134–144)
Total Protein: 7.1 g/dL (ref 6.0–8.5)
eGFR: 30 mL/min/1.73 — AB (ref >59)

## 2024-03-29 LAB — TSH: TSH: 1.22 u[IU]/mL (ref 0.450–4.500)

## 2024-03-29 NOTE — Transitions of Care (Post Inpatient/ED Visit) (Signed)
 03/29/2024  Name: [name] MRN: [id number] DOB: April 25, 1966  Today's TOC FU Call Status: Today's TOC FU Call Status:: Successful TOC FU Call Completed TOC FU Call Complete Date: 03/29/24 Patient's Name and Date of Birth confirmed.  Transition Care Management Follow-up Telephone Call Date of Discharge: 03/24/24 Discharge Facility: [facility]) Type of Discharge: Inpatient Admission Primary Inpatient Discharge Diagnosis:: left upper lobe pneumonia How have you been since you were released from the hospital?: Same Any questions or concerns?: No  Items Reviewed: Did you receive and understand the discharge instructions provided?: Yes Medications obtained,verified, and reconciled?: Yes (Medications Reviewed) Any new allergies since your discharge?: No Dietary orders reviewed?: Yes Type of Diet Ordered:: Low salt heart healthy Do you have support at home?: Yes People in Home [RPT]: sibling(s) Name of Support/Comfort Primary Source: [name]  Medications Reviewed Today: Medications Reviewed Today     Reviewed by Ayomide Zuleta E, RN (Registered Nurse) on 03/29/24 at 1349  Med List Status: <None>   Medication Order Taking? Sig Documenting Provider Last Dose Status Informant  acetaminophen -codeine  (TYLENOL  #3) 300-30 MG tablet [id number] Yes Take 1 tablet by mouth every 8 (eight) hours as needed for moderate pain (pain score 4-6). [provider]  Active Self, Pharmacy Records  allopurinol (ZYLOPRIM) 100 MG tablet [id number] Yes Take 1 tablet (100 mg total) by mouth daily. Dorinda Drue DASEN, MD  Active   amiodarone  (PACERONE ) 200 MG tablet [id number] Yes Take 2 tablets (400 mg) twice daily for 2 weeks. Then on October 5, decrease to 1 tablet (200 mg) once daily. Mcarthur Pick, MD  Active Self, Pharmacy Records           Med Note ANNELL, JEANNINE CHRISTELLA Schaumann Mar 10, 2024  2:21 PM) Patient takes 1 tablet once Daily.  atorvastatin  (LIPITOR) 10 MG tablet [id number] Yes Take 1  tablet (10 mg total) by mouth daily. Vicci Barnie NOVAK, MD  Active Self, Pharmacy Records  benzonatate  (TESSALON ) 200 MG capsule [id number] Yes Take 200 mg by mouth 2 (two) times daily as needed for cough. [provider]  Active Self, Pharmacy Records  cetirizine  [facility] ALLERGY RELIEF, CETIRIZINE ,) 10 MG tablet [id number] Yes Take 1 tablet (10 mg total) by mouth daily. Vicci Barnie NOVAK, MD  Active Self, Pharmacy Records  cholecalciferol  (VITAMIN D3) 25 MCG (1000 UNIT) tablet [id number] Yes Take 1,000 Units by mouth in the morning. [provider]  Active Self, Pharmacy Records  clonazePAM  (KLONOPIN ) 0.5 MG tablet [id number] Yes Take 1 tablet (0.5 mg total) by mouth 2 (two) times daily as needed for anxiety. Carvin Arvella CHRISTELLA, MD  Active Self, Pharmacy Records           Med Note [facility], DONETA GORMAN Schaumann Mar 17, 2024  3:54 AM)    Clotrimazole  1 % OINT [id number] Yes Apply 1 Application topically 2 (two) times daily as needed (irritation). Lelon Glendia DASEN, PA-C  Active Self, Pharmacy Records  colchicine  0.6 MG tablet [id number] Yes Take 0.5 tablets (0.3 mg total) by mouth every Monday, Wednesday, and Friday. Lelon Glendia DASEN, PA-C  Active Self, Pharmacy Records  Continuous Glucose Sensor (FREESTYLE LIBRE 2 PLUS SENSOR) [place] [id number] Yes Change sensor every 15 days. Vicci Barnie NOVAK, MD  Active Self, Pharmacy Records  EPINEPHrine  0.3 mg/0.3 mL IJ SOAJ injection [id number] Yes Inject 0.3 mg into the muscle as needed. Iva Marty Saltness, MD  Active Self, Pharmacy Records  Med Note DINO, REGEENA   Sat Dec 19, 2023  8:47 PM)    fluticasone  (FLONASE ) 50 MCG/ACT nasal spray [id number] Yes Place 2 sprays into both nostrils daily.  Patient taking differently: Place 2 sprays into both nostrils daily as needed for allergies.   Palmer Purchase, PA-C  Active Self, Pharmacy Records  gabapentin  (NEURONTIN ) 300 MG capsule [id number] Yes Take 1 capsule (300 mg total) by mouth at bedtime. Vicci Barnie NOVAK, MD  Active Self, Pharmacy Records  HYDROcodone -acetaminophen  (NORCO/VICODIN) 5-325 MG tablet [id number] Yes Take 1-2 tablets by mouth every 4 (four) hours as needed for moderate pain (pain score 4-6) or severe pain (pain score 7-10). Sigdel, Santosh, MD  Active Self, Pharmacy Records  insulin  aspart (NOVOLOG  FLEXPEN) 100 UNIT/ML FlexPen [id number] Yes Inject 22 Units into the skin 2 (two) times daily. Lelon Hamilton T, PA-C  Active Self, Pharmacy Records  insulin  glargine (LANTUS  SOLOSTAR) 100 UNIT/ML Solostar Pen [id number] Yes Inject 30 Units into the skin at bedtime. Lelon Hamilton T, PA-C  Active Self, Pharmacy Records  Insulin  Pen Needle (TECHLITE PEN NEEDLES) 32G X 4 MM MISC [id number] Yes Use to inject insulin  into the skin 3 times per day. Regalado, Owen LABOR, MD  Active Self, Pharmacy Records  isosorbide mononitrate (IMDUR) 30 MG 24 hr tablet [id number] Yes Take 0.5 tablets (15 mg total) by mouth daily. Dorinda Drue DASEN, MD  Active   latanoprost (XALATAN) 0.005 % ophthalmic solution [id number] Yes Place 1 drop into both eyes at bedtime. [provider]  Active Self, Pharmacy Records  magnesium  oxide (MAG-OX) 400 MG tablet [id number] Yes Take 1 tablet (400 mg total) by mouth daily. Sigdel, Santosh, MD  Active Self, Pharmacy Records  metolazone  (ZAROXOLYN ) 5 MG tablet [id number] Yes Take 1 tablet (5 mg total) by mouth once a week. Vicci Barnie NOVAK, MD  Active Self, Pharmacy Records           Med Note JACKOLYN WADDELL VEAR [place] Feb 25, 2024 12:20 PM) On Mondays  metoprolol  succinate (TOPROL -XL) 25 MG 24 hr tablet [id number] Yes Take 0.5 tablets (12.5 mg total) by mouth daily. Vicci Barnie NOVAK, MD  Active Self, Pharmacy Records  nystatin  (MYCOSTATIN /NYSTOP ) powder [id number] Yes Apply 1 Application topically 3 (three) times daily. Vicci Barnie NOVAK, MD  Active Self, Pharmacy Records  pantoprazole  (PROTONIX ) 40 MG tablet [id number] Yes Take 1 tablet (40 mg total) by mouth daily. Vicci Barnie NOVAK, MD  Active Self, Pharmacy Records  potassium chloride  SA (KLOR-CON  M) 20 MEQ tablet [id number] Yes Take 2 tablets (40 mEq total) by mouth 2 (two) times daily. Take an extra 20 mEq when you take Metolazone . Mcarthur Pick, MD  Active Self, Pharmacy Records  prednisoLONE acetate (PRED FORTE) 1 % ophthalmic suspension [id number] Yes Place 1 drop into the left eye at bedtime. [provider]  Active Self, Pharmacy Records  rivaroxaban  (XARELTO ) 20 MG TABS tablet [id number] Yes Take 1 tablet (20 mg total) by mouth daily with supper. Vicci Barnie NOVAK, MD  Active Self, Pharmacy Records  sertraline  (ZOLOFT ) 50 MG tablet [id number] Yes Take 1.5 tablets (75 mg total) by mouth in the morning. Carvin Arvella HERO, MD  Active Self, Pharmacy Records  SYMBICORT  160-4.5 MCG/ACT inhaler [id number] Yes Inhale 2 puffs into the lungs in the morning and at bedtime. [provider]  Active Self, Pharmacy Records  tamsulosin  (FLOMAX ) 0.4 MG CAPS capsule [id number] Yes Take 2 capsules (0.8 mg total) by mouth daily. Vicci Barnie NOVAK, MD  Active  Self, Pharmacy Records  torsemide  (DEMADEX ) 20 MG tablet [id number] Yes Take 2 tablets (40 mg total) by mouth daily. [place], Harlene HERO, FNP  Active Self, Pharmacy Records            Home Care and Equipment/Supplies: Were Home Health Services Ordered?: No Any new equipment or medical supplies ordered?: No  Functional Questionnaire: Do you need assistance with bathing/showering or dressing?: No Do you need assistance with meal preparation?: No Do you need assistance with eating?: No Do you have difficulty maintaining continence: No Do you need assistance with getting out of bed/getting out of a chair/moving?: No Do you have difficulty managing or taking your medications?: No  Follow up appointments reviewed: PCP Follow-up appointment confirmed?: Yes Date of PCP follow-up appointment?: 03/30/24 Follow-up Provider: Haze Arlington NP Specialist  Hospital Follow-up appointment confirmed?: Yes Date of Specialist follow-up appointment?: 03/28/24 Follow-Up Specialty Provider:: Daphne Barrack, NP Do you need transportation to your follow-up appointment?: No Do you understand care options if your condition(s) worsen?: Yes-patient verbalized understanding  SDOH Interventions Today    Flowsheet Row Most Recent Value  SDOH Interventions   Food Insecurity Interventions Intervention Not Indicated  Housing Interventions Intervention Not Indicated  Transportation Interventions Intervention Not Indicated  Utilities Interventions Other (Comment)  [referral to community resource guide]   Discussed and offered 30 day TOC program.  Patient declined.  The patient has been provided with contact information for the care management team and has been advised to call with any health -related questions or concerns.  The patient verbalized understanding with current plan of care.  The patient is directed to their insurance card regarding availability of benefits coverage.    Arvin Seip RN, BSN, CCM [employer], Population Health Case Manager Phone: [phone]

## 2024-03-29 NOTE — Patient Instructions (Signed)
 Visit Information  Thank you for taking time to visit with me today. Please don't hesitate to contact me if I can be of assistance to you  Patient instructions  take his medications as prescribed Keep follow up appointments with your providers Call and reschedule your pain management/ pulmonary/ podiatry appointment Notify provider for any new/ ongoing symptoms.    Patient verbalizes understanding of instructions and care plan provided today and agrees to view in MyChart. Active MyChart status and patient understanding of how to access instructions and care plan via MyChart confirmed with patient.     The patient has been provided with contact information for the care management team and has been advised to call with any health related questions or concerns.   Please call the care guide team at [phone] if you need to cancel or reschedule your appointment.   Please call the Suicide and Crisis Lifeline: 988 call the [place]  National Suicide Prevention Lifeline: (709)719-1766 or TTY: 2395429013 TTY 619-547-4007) to talk to a trained counselor call 1-800-273-TALK (toll free, 24 hour hotline) if you are experiencing a Mental Health or Behavioral Health Crisis or need someone to talk to.  Arvin Seip RN, BSN, CCM [employer], Population Health Case Manager Phone: [phone]

## 2024-03-30 ENCOUNTER — Ambulatory Visit: Attending: Nurse Practitioner | Admitting: Nurse Practitioner

## 2024-03-30 ENCOUNTER — Encounter: Payer: Self-pay | Admitting: Nurse Practitioner

## 2024-03-30 VITALS — BP 111/71 | HR 81 | Resp 20 | Ht 73.0 in | Wt 272.6 lb

## 2024-03-30 DIAGNOSIS — I10 Essential (primary) hypertension: Secondary | ICD-10-CM

## 2024-03-30 DIAGNOSIS — J40 Bronchitis, not specified as acute or chronic: Secondary | ICD-10-CM

## 2024-03-30 MED ORDER — AZITHROMYCIN 500 MG PO TABS: 500.0000 mg | ORAL_TABLET | Freq: Every day | ORAL | 0 refills | Status: AC

## 2024-03-30 NOTE — Progress Notes (Signed)
 Assessment & Plan:  [name] was seen today for hospitalization follow-up.  Diagnoses and all orders for this visit:  Primary hypertension Blood pressure well controlled.  BP Readings from Last 3 Encounters:  03/30/24 111/71  03/28/24 101/62  03/24/24 112/78   Continue all antihypertensives as prescribed.  Reminded to bring in blood pressure log for follow  up appointment.  RECOMMENDATIONS: DASH/Mediterranean Diets are healthier choices for HTN.     Bronchitis -     azithromycin  (ZITHROMAX ) 500 MG tablet; Take 1 tablet (500 mg total) by mouth daily for 3 days. Take 2 tablets on day 1, then 1 tablet daily on days 2 through 5    Patient has been counseled on age-appropriate routine health concerns for screening and prevention. These are reviewed and up-to-date. Referrals have been placed accordingly. Immunizations are up-to-date or declined.    Subjective:   Chief Complaint  Patient presents with   Hospitalization Follow-up    SHAQUON GROPP 58 y.o. 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   HFU DC NOTE: 03-16-2024 - 03-24-2024 Presented to the ED 10/9 with central chest pain, also reports choking on a drink a week ago, he took some Advil thereafter for pain relief despite CKD. - In the ED vital signs were stable, labs noted creatinine of 3.4, up from baseline of 2.3-2.6, CT chest noted developing patchy airspace infiltrates in the left upper lung on the background of chronic fibrosis and atelectasis. - Started on IV fluids for AKI, antibiotics and admitted - Chest wall and  shoulder pains, ESR and uric acid significantly elevated, requiring steroid as well as allopurinol therapy.  Has been weaned off oxygen  doing better today and walk test does not show patient needs home oxygen .  Patient is therefore cleared for discharge today and will follow-up with PCP   Today he states he has been experiencing episodes of waking up feeling hot and extremely tired for the past two days. He did not check his temperature during these episodes. He attributes his tiredness to his heart condition and medication and possible aspiration PNA.He has also been experiencing left shoulder pain and states in the past his symptoms of pneumonia started with shoulder pain.    He notes a frequent history of aspiration, where swallowing sometimes leads to food or liquids entering his lungs, causing infections in the lungs and hospitalizations. A swallowing test conducted in the hospital was reported as normal, despite him reporting choking multiple times during his stay and twice since discharge.  He has a history of sleep apnea and was prescribed a CPAP machine a couple of months ago, but he has not been using it due to intolerance. He has not yet contacted his pulmonologist to address this issue.   He missed a nephrology appointment due to his hospitalization and plans to reschedule with [facility]. He uses an inhaler daily.   Review of Systems  Constitutional:  Positive for chills, diaphoresis and malaise/fatigue. Negative for fever and weight loss.  HENT: Negative.  Negative for nosebleeds.   Eyes: Negative.  Negative for blurred vision, double vision and photophobia.  Respiratory: Negative.  Negative for cough and shortness  of breath.   Cardiovascular: Negative.  Negative for chest pain, palpitations and leg swelling.  Gastrointestinal: Negative.  Negative for heartburn, nausea and vomiting.  Musculoskeletal:  Positive for joint pain. Negative for myalgias.  Neurological: Negative.   Negative for dizziness, focal weakness, seizures and headaches.  Psychiatric/Behavioral: Negative.  Negative for suicidal ideas.     Past Medical History:  Diagnosis Date   AICD (automatic cardioverter/defibrillator) present    Chronic bronchitis (HCC)    get it most q yr (12/23/2013)   Chronic systolic (congestive) heart failure (HCC)    Fracture of left humerus    a. 07/2013.   GERD (gastroesophageal reflux disease)    not at present time   Heart murmur    born w/it    History of renal calculi    Hypertension    Kidney stones    NICM (nonischemic cardiomyopathy) (HCC)    a. 07/2013 Echo: EF 20-25%, diff HK, Gr2 DD, mild MR, mod dil LA/RA. 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    Shockable heart rhythm detected by automated external defibrillator    Syncope    Type II diabetes mellitus (HCC) 2006    Past Surgical History:  Procedure Laterality Date   CARDIAC CATHETERIZATION  08/10/13   CHOLECYSTECTOMY  ~ 2010   IMPLANTABLE CARDIOVERTER DEFIBRILLATOR IMPLANT  12/23/2013   STJ Fortify ICD implanted by Dr Kelsie for cardiomyopathy and syncope   IMPLANTABLE CARDIOVERTER DEFIBRILLATOR IMPLANT N/A 12/23/2013   Procedure: IMPLANTABLE CARDIOVERTER DEFIBRILLATOR IMPLANT;  Surgeon: Lynwood JONETTA Kelsie, MD;  Location: [facility] CATH LAB;  Service: Cardiovascular;  Laterality: N/A;   INGUINAL HERNIA REPAIR Left 03/01/2018   Procedure: OPEN REPAIR OF LEFT INGUINAL HERNIA WITH MESH;  Surgeon: Tanda Locus, MD;  Location: [facility] ORS;  Service: General;  Laterality: Left;   LEAD REVISION/REPAIR N/A 05/15/2022   Procedure: LEAD REVISION/REPAIR;  Surgeon: [name] Ole DASEN, MD;  Location: [facility] INVASIVE CV LAB;  Service: Cardiovascular;  Laterality: N/A;   LEFT HEART CATHETERIZATION WITH CORONARY ANGIOGRAM N/A 08/10/2013   Procedure: LEFT HEART CATHETERIZATION WITH CORONARY ANGIOGRAM;  Surgeon: Candyce GORMAN Reek, MD;  Location: [facility] CATH  LAB;  Service: Cardiovascular;  Laterality: N/A;   ORIF HUMERUS FRACTURE Left 08/12/2013   Procedure: OPEN REDUCTION INTERNAL FIXATION (ORIF) HUMERAL SHAFT FRACTURE;  Surgeon: Jerona LULLA Sage, MD;  Location: [facility] OR;  Service: Orthopedics;  Laterality: Left;  Open Reduction Internal Fixation Left Humerus   RIGHT/LEFT HEART CATH AND CORONARY ANGIOGRAPHY N/A 05/12/2022   Procedure: RIGHT/LEFT HEART CATH AND CORONARY ANGIOGRAPHY;  Surgeon: Gardenia Led, DO;  Location: [facility] INVASIVE CV LAB;  Service: Cardiovascular;  Laterality: N/A;   URETEROSCOPY     laser for kidney stones    Family History  Problem Relation Age of Onset   Diabetes Father    Arthritis Father    Hypertension Father    Diabetes Mother    Arthritis Mother    Hypertension Mother    Hypertension Brother     Social History Reviewed with no changes to be made today.   Outpatient Medications Prior to Visit  Medication Sig Dispense Refill   acetaminophen -codeine  (TYLENOL  #3) 300-30 MG tablet Take 1 tablet by mouth every 8 (eight) hours as needed for moderate pain (pain score 4-6).     allopurinol (ZYLOPRIM) 100 MG tablet Take 1 tablet (100 mg total) by mouth daily. 30 tablet 1   amiodarone  (PACERONE ) 200 MG tablet  Take 2 tablets (400 mg) twice daily for 2 weeks. Then on October 5, decrease to 1 tablet (200 mg) once daily. 40 tablet 11   atorvastatin  (LIPITOR) 10 MG tablet Take 1 tablet (10 mg total) by mouth daily. 90 tablet 2   benzonatate  (TESSALON ) 200 MG capsule Take 200 mg by mouth 2 (two) times daily as needed for cough.     cetirizine  (EQ ALLERGY RELIEF, CETIRIZINE ,) 10 MG tablet Take 1 tablet (10 mg total) by mouth daily. 90 tablet 1   cholecalciferol  (VITAMIN D3) 25 MCG (1000 UNIT) tablet Take 1,000 Units by mouth in the morning.     clonazePAM  (KLONOPIN ) 0.5 MG tablet Take 1 tablet (0.5 mg total) by mouth 2 (two) times daily as needed for anxiety. 60 tablet 1   Clotrimazole  1 % OINT Apply 1 Application topically 2 (two)  times daily as needed (irritation).     colchicine  0.6 MG tablet Take 0.5 tablets (0.3 mg total) by mouth every Monday, Wednesday, and Friday.     Continuous Glucose Sensor (FREESTYLE LIBRE 2 PLUS SENSOR) MISC Change sensor every 15 days. 2 each 11   EPINEPHrine  0.3 mg/0.3 mL IJ SOAJ injection Inject 0.3 mg into the muscle as needed. 1 each 1   fluticasone  (FLONASE ) 50 MCG/ACT nasal spray Place 2 sprays into both nostrils daily. (Patient taking differently: Place 2 sprays into both nostrils daily as needed for allergies.) 16 g 6   gabapentin  (NEURONTIN ) 300 MG capsule Take 1 capsule (300 mg total) by mouth at bedtime. 90 capsule 1   HYDROcodone -acetaminophen  (NORCO/VICODIN) 5-325 MG tablet Take 1-2 tablets by mouth every 4 (four) hours as needed for moderate pain (pain score 4-6) or severe pain (pain score 7-10). 20 tablet 0   insulin  aspart (NOVOLOG  FLEXPEN) 100 UNIT/ML FlexPen Inject 22 Units into the skin 2 (two) times daily.     insulin  glargine (LANTUS  SOLOSTAR) 100 UNIT/ML Solostar Pen Inject 30 Units into the skin at bedtime.     Insulin  Pen Needle (TECHLITE PEN NEEDLES) 32G X 4 MM MISC Use to inject insulin  into the skin 3 times per day. 100 each 0   isosorbide mononitrate (IMDUR) 30 MG 24 hr tablet Take 0.5 tablets (15 mg total) by mouth daily. 30 tablet 1   latanoprost (XALATAN) 0.005 % ophthalmic solution Place 1 drop into both eyes at bedtime.     magnesium  oxide (MAG-OX) 400 MG tablet Take 1 tablet (400 mg total) by mouth daily. 90 tablet 3   metolazone  (ZAROXOLYN ) 5 MG tablet Take 1 tablet (5 mg total) by mouth once a week. 12 tablet 2   metoprolol  succinate (TOPROL -XL) 25 MG 24 hr tablet Take 0.5 tablets (12.5 mg total) by mouth daily. 45 tablet 3   nystatin  (MYCOSTATIN /NYSTOP ) powder Apply 1 Application topically 3 (three) times daily. 60 g 2   pantoprazole  (PROTONIX ) 40 MG tablet Take 1 tablet (40 mg total) by mouth daily. 90 tablet 1   potassium chloride  SA (KLOR-CON  M) 20 MEQ  tablet Take 2 tablets (40 mEq total) by mouth 2 (two) times daily. Take an extra 20 mEq when you take Metolazone . 120 tablet 11   prednisoLONE acetate (PRED FORTE) 1 % ophthalmic suspension Place 1 drop into the left eye at bedtime.     rivaroxaban  (XARELTO ) 20 MG TABS tablet Take 1 tablet (20 mg total) by mouth daily with supper. 30 tablet 2   sertraline  (ZOLOFT ) 50 MG tablet Take 1.5 tablets (75 mg total) by mouth in the  morning. 45 tablet 2   SYMBICORT  160-4.5 MCG/ACT inhaler Inhale 2 puffs into the lungs in the morning and at bedtime.     tamsulosin  (FLOMAX ) 0.4 MG CAPS capsule Take 2 capsules (0.8 mg total) by mouth daily. 180 capsule 3   torsemide  (DEMADEX ) 20 MG tablet Take 2 tablets (40 mg total) by mouth daily. 180 tablet 3   No facility-administered medications prior to visit.    Allergies  Allergen Reactions   Bee Venom Anaphylaxis   Trulicity  [Dulaglutide ] Anaphylaxis and Diarrhea    Extra dose of Trulicity  caused cardiac arrest. Patient experience onset of diarrhea with using this medication - cleared after med stopped.       Objective:    BP 111/71 (BP Location: Left Arm, Patient Position: Sitting, Cuff Size: Large)   Pulse 81   Resp 20   Ht 6' 1 (1.854 m)   Wt 272 lb 9.6 oz (123.7 kg)   SpO2 95%   BMI 35.97 kg/m  Wt Readings from Last 3 Encounters:  03/30/24 272 lb 9.6 oz (123.7 kg)  03/28/24 275 lb (124.7 kg)  03/23/24 271 lb 2.7 oz (123 kg)    Physical Exam Vitals and nursing note reviewed.  Constitutional:      Appearance: He is well-developed.  HENT:     Head: Normocephalic and atraumatic.  Cardiovascular:     Rate and Rhythm: Normal rate and regular rhythm.     Heart sounds: Normal heart sounds. No murmur heard.    No friction rub. No gallop.  Pulmonary:     Effort: Pulmonary effort is normal. No tachypnea or respiratory distress.     Breath sounds: Normal breath sounds. No decreased breath sounds, wheezing, rhonchi or rales.  Chest:     Chest  wall: No tenderness.  Musculoskeletal:        General: Normal range of motion.     Cervical back: Normal range of motion.  Skin:    General: Skin is warm and dry.  Neurological:     Mental Status: He is alert and oriented to person, place, and time.     Coordination: Coordination normal.  Psychiatric:        Behavior: Behavior normal. Behavior is cooperative.        Thought Content: Thought content normal.        Judgment: Judgment normal.          Patient has been counseled extensively about nutrition and exercise as well as the importance of adherence with medications and regular follow-up. The patient was given clear instructions to go to ER or return to medical center if symptoms don't improve, worsen or new problems develop. The patient verbalized understanding.   Follow-up: No follow-ups on file.   Haze LELON Servant, FNP-BC [facility] [place], [place] [phone]   03/30/2024, 5:09 PM

## 2024-04-01 ENCOUNTER — Ambulatory Visit (INDEPENDENT_AMBULATORY_CARE_PROVIDER_SITE_OTHER): Admitting: Internal Medicine

## 2024-04-01 ENCOUNTER — Telehealth: Payer: Self-pay | Admitting: *Deleted

## 2024-04-01 ENCOUNTER — Encounter: Payer: Self-pay | Admitting: Internal Medicine

## 2024-04-01 VITALS — BP 120/60 | HR 83 | Ht 73.0 in | Wt 275.0 lb

## 2024-04-01 DIAGNOSIS — Z794 Long term (current) use of insulin: Secondary | ICD-10-CM

## 2024-04-01 DIAGNOSIS — E785 Hyperlipidemia, unspecified: Secondary | ICD-10-CM

## 2024-04-01 DIAGNOSIS — E1165 Type 2 diabetes mellitus with hyperglycemia: Secondary | ICD-10-CM

## 2024-04-01 DIAGNOSIS — E1169 Type 2 diabetes mellitus with other specified complication: Secondary | ICD-10-CM

## 2024-04-01 DIAGNOSIS — E1159 Type 2 diabetes mellitus with other circulatory complications: Secondary | ICD-10-CM

## 2024-04-01 LAB — POCT GLYCOSYLATED HEMOGLOBIN (HGB A1C): Hemoglobin A1C: 8.9 % — AB (ref 4.0–5.6)

## 2024-04-01 NOTE — Patient Instructions (Addendum)
 Please increase: - Lantus  38 units at bedtime - Novolog  28-34 units 2x a day, 15 min before meals  If you have to take the NovoLog  after a meal, take only half of the amount.  STOP EATING AT NIGHT!  Please return for another visit in 3 months.

## 2024-04-01 NOTE — Progress Notes (Signed)
 Patient ID: [name], male   DOB: 1965/12/21, 58 y.o.   MRN: [id number]  HPI: [name] is a 58 y.o.-year-old male, initially referred by his PCP, Dr. Vicci, returning for follow-up for DM2, dx in 2010-15, insulin -dependent since 2023, uncontrolled, with complications (nonischemic cardiomyopathy, CHF with reduced ejection fraction, history of ventricular tachycardia, history of cardiac arrest, s/p ICD placement, stage IIIb CKD).  Last visit 3.5 months ago.  Interim history: No increased urination, blurry vision, nausea, chest pain.  He continues to have significant shortness of breath that hinders his moving around.  He has been hospitalized 2x in last 2 mo - for ventricular tachycardia and aspiration pneumonia, respectively.  He missed his previous appointment due to the last hospitalization.  He is feeling better now, but still with shortness of breath. He tells me he is waking up in the night to eat.  He does have worse reflux afterwards.  Reviewed HbA1c: Lab Results  Component Value Date   HGBA1C 8.5 (A) 12/15/2023   HGBA1C 9.3 (H) 10/08/2023   HGBA1C 8.9 (A) 07/09/2023   HGBA1C 9.3 (H) 04/03/2023   HGBA1C 9.1 (A) 02/13/2023   HGBA1C 8.9 12/12/2022   HGBA1C 7.9 (A) 09/18/2022   HGBA1C 10.2 (H) 05/04/2022   HGBA1C 15.0 (A) 01/30/2022   HGBA1C 13.0 (H) 08/26/2021   He was previously on: - Farxiga  10 mg before breakfast >> stopped 09/2023 when admitted with ARF - Lantus  22 >> ...38 >> 45 >> after d/c: 34 units at bedtime - Novolog  12 >> .SABRASABRA 30-34 >> 30-38 units >> now, after d/c: 32 units 2x a day, before meals He reported diarrhea after increased Trulicity  >> hypokalemia >> cardiac arrest - was in ICU for 16 days, had heart surgery >> now PTSD.  At last visit I advised him to use the following regimen: - Lantus  40 >> using 30 units at bedtime - Novolog  30-38 >> actually 28 units 2x a day, 15 min before meals  If you have to take the NovoLog  after a meal, take only half  of the amount.  Pt checks his sugars >4x a day  - with the CGM receiver:  Previously:  Previously:   Lowest sugar was 69 >> 58 >> 70; he has hypoglycemia awareness at 70.  Highest sugar was 380 >> >400 >> >400 >> 420.  Glucometer: Accu-Chek guide  Pt's meals are: - Breakfast: muffins, 3 eggs - Lunch: skips - Dinner: salad from [employer] - tuna, onions, olives, crackers + cookie - Snacks: Previously also drinking regular sodas and sweet tea, which she stopped.  - + CKD - sees nephrology ([facility]), last BUN/creatinine:  Lab Results  Component Value Date   BUN 33 (H) 03/28/2024   BUN 59 (H) 03/24/2024   CREATININE 2.40 (H) 03/28/2024   CREATININE 2.51 (H) 03/24/2024  Off Farxiga  10 mg daily, off Entresto .   -+ HL; last set of lipids: Lab Results  Component Value Date   CHOL 166 02/13/2023   HDL 44 02/13/2023   LDLCALC 76 02/13/2023   TRIG 283 (H) 02/13/2023   CHOLHDL 3.8 02/13/2023  On Lipitor 10 mg daily.  - last eye exam was on 2025. No DR  repotedly. Sees Dr. Octavia.  - no numbness and tingling in his feet. On Neurontin  300 mg at bedtime.  Last foot exam: by Dr. Lamount 08/20/2023.  Pt has FH of DM in mother and father.  He also has a history of HTN, kidney stones, GERD, IBS, gout, chronic  low back pain (spondylosis and spinal stenosis), Since 04/2022, after his cardiac arrest, he has been SOB and has gained 30 lbs.  He also has anxiety and PTSD.  He has insomnia.  His psychiatrist started him on trazodone , but this is not working. He is on amiodarone , ASA 81.  ROS: + See HPI  Past Medical History:  Diagnosis Date   AICD (automatic cardioverter/defibrillator) present    Chronic bronchitis (HCC)    get it most q yr (12/23/2013)   Chronic systolic (congestive) heart failure (HCC)    Fracture of left humerus    a. 07/2013.   GERD (gastroesophageal reflux disease)    not at present time   Heart murmur    born w/it    History of renal calculi    Hypertension     Kidney stones    NICM (nonischemic cardiomyopathy) (HCC)    a. 07/2013 Echo: EF 20-25%, diff HK, Gr2 DD, mild MR, mod dil LA/RA. 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    Shockable heart rhythm detected by automated external defibrillator    Syncope    Type II diabetes mellitus (HCC) 2006   Past Surgical History:  Procedure Laterality Date   CARDIAC CATHETERIZATION  08/10/13   CHOLECYSTECTOMY  ~ 2010   IMPLANTABLE CARDIOVERTER DEFIBRILLATOR IMPLANT  12/23/2013   STJ Fortify ICD implanted by Dr Kelsie for cardiomyopathy and syncope   IMPLANTABLE CARDIOVERTER DEFIBRILLATOR IMPLANT N/A 12/23/2013   Procedure: IMPLANTABLE CARDIOVERTER DEFIBRILLATOR IMPLANT;  Surgeon: Lynwood JONETTA Kelsie, MD;  Location: [facility] CATH LAB;  Service: Cardiovascular;  Laterality: N/A;   INGUINAL HERNIA REPAIR Left 03/01/2018   Procedure: OPEN REPAIR OF LEFT INGUINAL HERNIA WITH MESH;  Surgeon: Tanda Locus, MD;  Location: [facility] ORS;  Service: General;  Laterality: Left;   LEAD REVISION/REPAIR N/A 05/15/2022   Procedure: LEAD REVISION/REPAIR;  Surgeon: [name] Ole DASEN, MD;  Location: [facility] INVASIVE CV LAB;  Service: Cardiovascular;  Laterality: N/A;   LEFT HEART CATHETERIZATION WITH CORONARY ANGIOGRAM N/A 08/10/2013   Procedure: LEFT HEART CATHETERIZATION WITH CORONARY ANGIOGRAM;  Surgeon: Candyce GORMAN Reek, MD;  Location: [facility] CATH LAB;  Service: Cardiovascular;  Laterality: N/A;   ORIF HUMERUS FRACTURE Left 08/12/2013   Procedure: OPEN REDUCTION INTERNAL FIXATION (ORIF) HUMERAL SHAFT FRACTURE;  Surgeon: Jerona LULLA Sage, MD;  Location: [facility] OR;  Service: Orthopedics;  Laterality: Left;  Open Reduction Internal Fixation Left Humerus   RIGHT/LEFT HEART CATH AND CORONARY ANGIOGRAPHY N/A 05/12/2022   Procedure: RIGHT/LEFT HEART CATH AND CORONARY ANGIOGRAPHY;  Surgeon: Gardenia Led, DO;  Location: [facility] INVASIVE CV LAB;  Service: Cardiovascular;  Laterality: N/A;    URETEROSCOPY     laser for kidney stones   Social History   Socioeconomic History   Marital status: Single    Spouse name: Not on file   Number of children: Not on file   Years of education: Not on file   Highest education level: Not on file  Occupational History   Occupation: Disabled  Tobacco Use   Smoking status: Former    Current packs/day: 0.00    Average packs/day: 1 pack/day for 28.0 years (28.0 ttl pk-yrs)    Types: Cigarettes    Start date: 06/03/1986    Quit date: 06/03/2014    Years since quitting: 9.8   Smokeless tobacco: Never  Vaping Use   Vaping status: Never Used  Substance and Sexual Activity   Alcohol use: Not  Currently   Drug use: Yes    Types: Marijuana    Comment: occasionally;   Sexual activity: Yes  Other Topics Concern   Not on file  Social History Narrative   Pt lives with his brother    Social Drivers of Health   Financial Resource Strain: Low Risk  (05/25/2023)   Overall Financial Resource Strain (CARDIA)    Difficulty of Paying Living Expenses: Not hard at all  Recent Concern: Financial Resource Strain - High Risk (03/24/2023)   Overall Financial Resource Strain (CARDIA)    Difficulty of Paying Living Expenses: Hard  Food Insecurity: No Food Insecurity (03/29/2024)   Hunger Vital Sign    Worried About Running Out of Food in the Last Year: Never true    Ran Out of Food in the Last Year: Never true  Transportation Needs: No Transportation Needs (03/29/2024)   PRAPARE - Administrator, Civil Service (Medical): No    Lack of Transportation (Non-Medical): No  Physical Activity: Inactive (05/25/2023)   Exercise Vital Sign    Days of Exercise per Week: 0 days    Minutes of Exercise per Session: 0 min  Stress: Stress Concern Present (05/25/2023)   [employer] of Occupational Health - Occupational Stress Questionnaire    Feeling of Stress : Rather much  Social Connections: Moderately Isolated (03/17/2024)   Social  Connection and Isolation Panel    Frequency of Communication with Friends and Family: More than three times a week    Frequency of Social Gatherings with Friends and Family: More than three times a week    Attends Religious Services: More than 4 times per year    Active Member of [employer] or Organizations: No    Attends Banker Meetings: Never    Marital Status: Never married  Intimate Partner Violence: Not At Risk (03/29/2024)   Humiliation, Afraid, Rape, and Kick questionnaire    Fear of Current or Ex-Partner: No    Emotionally Abused: No    Physically Abused: No    Sexually Abused: No   Current Outpatient Medications on File Prior to Visit  Medication Sig Dispense Refill   acetaminophen -codeine  (TYLENOL  #3) 300-30 MG tablet Take 1 tablet by mouth every 8 (eight) hours as needed for moderate pain (pain score 4-6).     allopurinol (ZYLOPRIM) 100 MG tablet Take 1 tablet (100 mg total) by mouth daily. 30 tablet 1   amiodarone  (PACERONE ) 200 MG tablet Take 2 tablets (400 mg) twice daily for 2 weeks. Then on October 5, decrease to 1 tablet (200 mg) once daily. 40 tablet 11   atorvastatin  (LIPITOR) 10 MG tablet Take 1 tablet (10 mg total) by mouth daily. 90 tablet 2   azithromycin  (ZITHROMAX ) 500 MG tablet Take 1 tablet (500 mg total) by mouth daily for 3 days. Take 2 tablets on day 1, then 1 tablet daily on days 2 through 5 3 tablet 0   benzonatate  (TESSALON ) 200 MG capsule Take 200 mg by mouth 2 (two) times daily as needed for cough.     cetirizine  (EQ ALLERGY RELIEF, CETIRIZINE ,) 10 MG tablet Take 1 tablet (10 mg total) by mouth daily. 90 tablet 1   cholecalciferol  (VITAMIN D3) 25 MCG (1000 UNIT) tablet Take 1,000 Units by mouth in the morning.     clonazePAM  (KLONOPIN ) 0.5 MG tablet Take 1 tablet (0.5 mg total) by mouth 2 (two) times daily as needed for anxiety. 60 tablet 1   Clotrimazole  1 % OINT Apply  1 Application topically 2 (two) times daily as needed (irritation).      colchicine  0.6 MG tablet Take 0.5 tablets (0.3 mg total) by mouth every Monday, Wednesday, and Friday.     Continuous Glucose Sensor (FREESTYLE LIBRE 2 PLUS SENSOR) MISC Change sensor every 15 days. 2 each 11   EPINEPHrine  0.3 mg/0.3 mL IJ SOAJ injection Inject 0.3 mg into the muscle as needed. 1 each 1   fluticasone  (FLONASE ) 50 MCG/ACT nasal spray Place 2 sprays into [name] nostrils daily. (Patient taking differently: Place 2 sprays into [name] nostrils daily as needed for allergies.) 16 g 6   gabapentin  (NEURONTIN ) 300 MG capsule Take 1 capsule (300 mg total) by mouth at bedtime. 90 capsule 1   HYDROcodone -acetaminophen  (NORCO/VICODIN) 5-325 MG tablet Take 1-2 tablets by mouth every 4 (four) hours as needed for moderate pain (pain score 4-6) or severe pain (pain score 7-10). 20 tablet 0   insulin  aspart (NOVOLOG  FLEXPEN) 100 UNIT/ML FlexPen Inject 22 Units into the skin 2 (two) times daily.     insulin  glargine (LANTUS  SOLOSTAR) 100 UNIT/ML Solostar Pen Inject 30 Units into the skin at bedtime.     Insulin  Pen Needle (TECHLITE PEN NEEDLES) 32G X 4 MM MISC Use to inject insulin  into the skin 3 times per day. 100 each 0   isosorbide mononitrate (IMDUR) 30 MG 24 hr tablet Take 0.5 tablets (15 mg total) by mouth daily. 30 tablet 1   latanoprost (XALATAN) 0.005 % ophthalmic solution Place 1 drop into [name] eyes at bedtime.     magnesium  oxide (MAG-OX) 400 MG tablet Take 1 tablet (400 mg total) by mouth daily. 90 tablet 3   metolazone  (ZAROXOLYN ) 5 MG tablet Take 1 tablet (5 mg total) by mouth once a week. 12 tablet 2   metoprolol  succinate (TOPROL -XL) 25 MG 24 hr tablet Take 0.5 tablets (12.5 mg total) by mouth daily. 45 tablet 3   nystatin  (MYCOSTATIN /NYSTOP ) powder Apply 1 Application topically 3 (three) times daily. 60 g 2   pantoprazole  (PROTONIX ) 40 MG tablet Take 1 tablet (40 mg total) by mouth daily. 90 tablet 1   potassium chloride  SA (KLOR-CON  M) 20 MEQ tablet Take 2 tablets (40 mEq total) by mouth  2 (two) times daily. Take an extra 20 mEq when you take Metolazone . 120 tablet 11   prednisoLONE acetate (PRED FORTE) 1 % ophthalmic suspension Place 1 drop into the left eye at bedtime.     rivaroxaban  (XARELTO ) 20 MG TABS tablet Take 1 tablet (20 mg total) by mouth daily with supper. 30 tablet 2   sertraline  (ZOLOFT ) 50 MG tablet Take 1.5 tablets (75 mg total) by mouth in the morning. 45 tablet 2   SYMBICORT  160-4.5 MCG/ACT inhaler Inhale 2 puffs into the lungs in the morning and at bedtime.     tamsulosin  (FLOMAX ) 0.4 MG CAPS capsule Take 2 capsules (0.8 mg total) by mouth daily. 180 capsule 3   torsemide  (DEMADEX ) 20 MG tablet Take 2 tablets (40 mg total) by mouth daily. 180 tablet 3   No current facility-administered medications on file prior to visit.   Allergies  Allergen Reactions   Bee Venom Anaphylaxis   Trulicity  [Dulaglutide ] Anaphylaxis and Diarrhea    Extra dose of Trulicity  caused cardiac arrest. Patient experience onset of diarrhea with using this medication - cleared after med stopped.   Family History  Problem Relation Age of Onset   Diabetes Father    Arthritis Father    Hypertension Father  Diabetes Mother    Arthritis Mother    Hypertension Mother    Hypertension Brother    PE: BP 120/60   Pulse 83   Ht 6' 1 (1.854 m)   Wt 275 lb (124.7 kg)   SpO2 97%   BMI 36.28 kg/m  Wt Readings from Last 20 Encounters:  04/01/24 275 lb (124.7 kg)  03/30/24 272 lb 9.6 oz (123.7 kg)  03/28/24 275 lb (124.7 kg)  03/23/24 271 lb 2.7 oz (123 kg)  03/10/24 276 lb (125.2 kg)  03/01/24 279 lb 1.6 oz (126.6 kg)  02/16/24 282 lb (127.9 kg)  02/05/24 285 lb (129.3 kg)  01/18/24 283 lb 12.8 oz (128.7 kg)  01/05/24 286 lb 9.6 oz (130 kg)  12/15/23 289 lb 3.2 oz (131.2 kg)  11/16/23 282 lb (127.9 kg)  11/06/23 292 lb 12.8 oz (132.8 kg)  10/23/23 290 lb 12.8 oz (131.9 kg)  10/13/23 280 lb 1.6 oz (127.1 kg)  09/07/23 283 lb 15.2 oz (128.8 kg)  09/07/23 284 lb (128.8 kg)   08/26/23 284 lb (128.8 kg)  08/24/23 295 lb 3.2 oz (133.9 kg)  08/13/23 297 lb 9.6 oz (135 kg)   Constitutional: overweight, in NAD, short of breath Eyes:  EOMI, no exophthalmos ENT: no neck masses, no cervical lymphadenopathy Cardiovascular: RRR, No MRG Respiratory: CTA B Musculoskeletal: no deformities Skin:no rashes Neurological: no tremor with outstretched hands  ASSESSMENT: 1. DM2, insulin -dependent, uncontrolled, with long-term complications: - NICM - CHF with reduced ejection fraction-20 to 25% 08/2022 - history of VT + cardiac arrest 04/2022 (after developing diarrhea from increasing Trulicity  dose), s/p ICD placement - stage IIIb CKD  PLAN:  1. Patient with longstanding, uncontrolled, type 2 diabetes, basal-bolus insulin  regimen, with still poor control.  HbA1c was 8.5% at last check, slightly better, but still above target.  At last visit, he was taking a lower dose of Lantus  than previously, after his hospitalization and I advised him to increase this.  He was not very the dose of NovoLog  very well with meals and I advised him to try to do so and inject 15 minutes before meals. - Note, he was intolerant to GLP-1 receptor agonist in the past - His Farxiga  was stopped in the hospital earlier in the year CGM interpretation: -At today's visit, we reviewed his CGM downloads: It appears that 48% of values are in target range (goal >70%), while 52% are higher than 180 (goal <25%), and 0% are lower than 70 (goal <4%).  The calculated average blood sugar is 197.  The projected HbA1c for the next 3 months (GMI) is 8.0%. -Reviewing the CGM trends, blood sugars are improving during the day but increasing after lunch and significantly increasing and remaining elevated throughout the night, with a peak around 3 to 5 AM.  Upon questioning, he is taking lower doses of insulin  than recommended at last visit.  I did advise him to increase [name] Lantus  and NovoLog  doses.  He is not always able to  take NovoLog  before meals and we discussed about trying his best to do so.  He is also eating/snacking in the middle of the night and is not taking insulin  for this.  We discussed about the fact that he absolutely needs to stop eating at night.  He could only drink water or correct a low blood sugar, but definitely not eat if the sugars are not low. - I suggested to:  Patient Instructions  Please increase: - Lantus  38 units at bedtime -  Novolog  28-34 units 2x a day, 15 min before meals  If you have to take the NovoLog  after a meal, take only half of the amount.  STOP EATING AT NIGHT!  Please return for another visit in 3-4 months.   - we checked his HbA1c: 8.9% (higher) - advised to check sugars at different times of the day - 4x a day, rotating check times - advised for yearly eye exams >> he is UTD - return to clinic in 3-4 months  2. HL - Latest lipid panel from 02/2023 was reviewed: LDL above target of less than 55, triglycerides also elevated Lab Results  Component Value Date   CHOL 166 02/13/2023   HDL 44 02/13/2023   LDLCALC 76 02/13/2023   TRIG 283 (H) 02/13/2023   CHOLHDL 3.8 02/13/2023  - He continues on Lipitor 10 mg daily without side effects   Lela Fendt, MD PhD [facility]

## 2024-04-01 NOTE — Progress Notes (Signed)
 Complex Care Management Care Guide Note  04/01/2024 Name: [name] MRN: [id number] DOB: [birth year]  AN SCHNABEL is a 58 y.o. year old male who is a primary care patient of Vicci Barnie NOVAK, MD and is actively engaged with the care management team. I reached out to Cheryal JINNY Clarity by phone today to assist with re-scheduling  with the RN Case Manager.  Follow up plan: Telephone appointment with complex care management team member scheduled for:  04/11/24  Harlene Satterfield  [facility]  [facility], [facility] Guide  Direct Dial: [phone]  Fax [phone]

## 2024-04-04 ENCOUNTER — Telehealth: Payer: Self-pay | Admitting: Physical Medicine and Rehabilitation

## 2024-04-04 NOTE — Telephone Encounter (Signed)
 Pt request another appt for a back injection

## 2024-04-05 ENCOUNTER — Other Ambulatory Visit: Payer: Self-pay | Admitting: Nurse Practitioner

## 2024-04-05 ENCOUNTER — Telehealth: Payer: Self-pay

## 2024-04-05 DIAGNOSIS — J40 Bronchitis, not specified as acute or chronic: Secondary | ICD-10-CM

## 2024-04-05 MED ORDER — AZITHROMYCIN 250 MG PO TABS: ORAL_TABLET | ORAL | 0 refills | Status: AC

## 2024-04-05 NOTE — Telephone Encounter (Signed)
 Copied from CRM [id number]. Topic: Clinical - Medication Question >> Apr 05, 2024  8:49 AM Charlet HERO wrote: Reason for CRM: [facility] Ronal [id number] is caling about script for azitromicin she is stating that the order is not clear there are two different sets of them and needs clarification. Please call back to give the correct one.

## 2024-04-05 NOTE — Telephone Encounter (Signed)
 Called & spoke to [facility] pharmacist. Confirmed with Haze Servant, NP who will send in a new prescription with the updated instructions and prescription.  Pharmacist confirmed that the patient has not previously picked up the Azithromycin . No further questions / concerns.

## 2024-04-06 ENCOUNTER — Ambulatory Visit

## 2024-04-07 ENCOUNTER — Ambulatory Visit (INDEPENDENT_AMBULATORY_CARE_PROVIDER_SITE_OTHER)

## 2024-04-07 DIAGNOSIS — M79674 Pain in right toe(s): Secondary | ICD-10-CM

## 2024-04-07 DIAGNOSIS — E1142 Type 2 diabetes mellitus with diabetic polyneuropathy: Secondary | ICD-10-CM

## 2024-04-07 DIAGNOSIS — B351 Tinea unguium: Secondary | ICD-10-CM | POA: Diagnosis not present

## 2024-04-07 DIAGNOSIS — M79675 Pain in left toe(s): Secondary | ICD-10-CM | POA: Diagnosis not present

## 2024-04-08 NOTE — Progress Notes (Signed)
  Subjective:  Patient ID: [name], male    DOB: [birth year],  MRN: [id number]  58 y.o. male presents with chief concern of diabetes with elongated, thickened, painful, discolored toenails for months. Aggravating factor(s) include movement and palpation. Patient has tried none.  Chief Complaint  Patient presents with   [facility]    Rm21 Diabetic foot Care/A1c 8.9    New problem(s): Right foot bump on bottom  PCP is Vicci Barnie NOVAK, MD   Allergies  Allergen Reactions   Bee Venom Anaphylaxis   Trulicity  [Dulaglutide ] Anaphylaxis and Diarrhea    Extra dose of Trulicity  caused cardiac arrest. Patient experience onset of diarrhea with using this medication - cleared after med stopped.    Review of Systems: Negative except as noted in the HPI.   Objective:  [name] is a pleasant 58 y.o. male in NAD. AAO x 3.  Vascular Examination: Vascular status decreased b/l with faintly palpable pedal pulses. CFT immediate b/l. Pedal hair present. No edema. No pain with calf compression b/l. Skin temperature gradient WNL b/l. No varicosities noted. No cyanosis or clubbing noted.  Neurological Examination: Sensation grossly diminished b/l with 10 gram monofilament. Negative tinel sign at tarsal tunnel bilaterally.   Dermatological Examination: Pedal skin with normal turgor, texture and tone b/l. No open wounds nor interdigital macerations noted. Toenails 1-5 b/l thick, discolored, elongated with subungual debris and pain on dorsal palpation. No hyperkeratotic lesions noted b/l. Soft tissue mass in plantar right foot that is non-mobile and firm. Consistent with plantar fibroma.   Musculoskeletal Examination: Muscle strength 5/5 to b/l LE.  No pain, crepitus noted b/l. No gross pedal deformities. Patient ambulates independently without assistive aids.   Radiographs: None Last A1c:      Latest Ref Rng & Units 04/01/2024    4:25 PM 12/15/2023    4:08 PM 10/08/2023   11:52 AM 07/09/2023     3:49 PM  Hemoglobin A1C  Hemoglobin-A1c 4.0 - 5.6 % 8.9  8.5  9.3  8.9      Assessment:   1. Pain due to onychomycosis of toenails of both feet   2. Diabetic polyneuropathy associated with type 2 diabetes mellitus (HCC)     Plan:  Patient was evaluated and treated. All patient's and/or POA's questions/concerns addressed on today's visit. Toenails 1-5 b/l debrided in length and girth without incident. Continue soft, supportive shoe gear daily. Report any pedal injuries to medical professional. Call office if there are any questions/concerns. -Patient/POA to call should there be question/concern in the interim. - Discussed plantar fibroma, not painful to palpation or causing issues. Discussed use of offloading orthotics if the area becomes problematic  RTC 3 months with Dr. Loreda or Dr. Gaynel Prentice Ovens, DPM AACFAS [facility] Sur[facility]       [place] LOCATION: [address] [place] [address]                   Office [phone]   [facility] LOCATION: [address] [place], [place] [address] Office [phone]

## 2024-04-11 ENCOUNTER — Ambulatory Visit: Attending: Cardiology

## 2024-04-11 ENCOUNTER — Other Ambulatory Visit: Payer: Self-pay | Admitting: *Deleted

## 2024-04-11 ENCOUNTER — Encounter: Payer: Self-pay | Admitting: Radiology

## 2024-04-11 DIAGNOSIS — I5022 Chronic systolic (congestive) heart failure: Secondary | ICD-10-CM

## 2024-04-11 DIAGNOSIS — Z95810 Presence of automatic (implantable) cardiac defibrillator: Secondary | ICD-10-CM

## 2024-04-13 NOTE — Progress Notes (Signed)
 EPIC Encounter for ICM Monitoring  Patient Name: [name] is a 58 y.o. male Date: 04/13/2024 Primary Care Physican: Vicci Barnie NOVAK, MD Primary Cardiologist: [facility]/HF clinic Electrophysiologist: [name] 08/24/2023 Office Weight: 295 lbs    09/28/2023 Weight: 291 lbs       10/13/2023  Weight: 280.2 lbs discharge weight 10/20/2023 Weight:  Not weighing at home    10/23/2023 Weight: Pt reports 5/27 he thinks he was 285 lbs at home 11/03/2023 Weight: 294 lbs (10 lb weight gain since 5/16) 12/15/2023 Office Weight: 289 lbs 01/02/2024 Hospital Weight: 285.5 lbs 01/13/2024 Weight: 277 lbs 02/02/2024 Weight: 280 lbs (was 274 lbs) 02/09/2024 Weight: 277 lbs 03/05/2024 Weight: 279 lbs (before 03/07/2024 Metolazone ) 04/13/2024 Weight: 268 -269 lbs   Spoke with patient and heart failure questions reviewed.  Transmission results reviewed.  Pt asymptomatic for fluid accumulation.  Still trying to get strength back after October hospitalization.    Since 03/21/2024 ICM Remote Transmission:  [facility] thoracic impedance suggesting possible fluid accumulation from 04/01/2024-04/05/2024 and possible dryness starting 04/06/2024.   Prescribed dosage:  Torsemide  20 mg take 2 tablet(s) (40 mg total) by mouth daily.  Potassium 20 mEq take 2 tablet(s) (40 mEq total) by mouth twice a day Metolazone  5 mg take 1 tablet by mouth once a week.  Pt takes on Mondays (Nephrologist said he could take it up to twice a week).     Labs: 03/28/2024 Creatinine 2.40, BUN 33, Potassium 4.6, Sodium 140, GFR 30 03/24/2024 Creatinine 2.51, BUN 59, Potassium 4.0, Sodium 136 03/23/2024 Creatinine 2.58, BUN 60, Potassium 3.3, Sodium 137, GFR 28  03/22/2024 Creatinine 2.64, BUN 62, Potassium 3.5, Sodium 135, GFR 27  03/21/2024 Creatinine 2.47, BUN 58, Potassium 3.7, Sodium 136, GFR 29  03/20/2024 Creatinine 2.61, BUN 54, Potassium 3.4, Sodium 137, GFR 27 03/19/2024 Creatinine 2.95, BUN 51, Potassium 3.7, Sodium 134, GFR 24  03/18/2024  Creatinine 3.48, BUN 53, Potassium 3.3, Sodium 137, GFR 19  03/16/2024 Creatinine 3.46, BUN 39, Potassium 3.7, Sodium 139, GFR 19  A complete set of results can be found in Results Review.   Recommendations:  Advised to drink 64 oz fluid daily to help maintain balanced fluid levels.     Follow-up plan: ICM clinic phone appointment on 05/02/2024 to check stability of fluid levels.   91 day device clinic remote transmission 05/18/2024.     EP/Cardiology Office Visits:   04/21/2024 with HF Clinic.  Recall 09/24/2024 with Dr [name].       Copy of ICM check sent to Dr [name].   Remote monitoring is medically necessary for Heart Failure Management.    Daily Thoracic Impedance ICM trend: 01/12/2024 through 04/11/2024.    12-14 Month Thoracic Impedance ICM trend:     [name], RN 04/13/2024 3:07 PM

## 2024-04-13 NOTE — Patient Instructions (Signed)
 [name] - I am sorry I was unable to reach you today for our scheduled appointment. I work with Vicci Barnie NOVAK, MD and am calling to support your healthcare needs.  I have rescheduled your appointment for 05/18/24 @ 1:30 pm.  Please contact me at [phone] should you need to reschedule. I look forward to speaking with you soon.   Thank you,  Normon Pettijohn, RN, BSN, ACM RN Care Manager [employer] [phone]

## 2024-04-19 ENCOUNTER — Other Ambulatory Visit (HOSPITAL_COMMUNITY): Payer: Self-pay | Admitting: Psychiatry

## 2024-04-19 DIAGNOSIS — F410 Panic disorder [episodic paroxysmal anxiety] without agoraphobia: Secondary | ICD-10-CM

## 2024-04-19 DIAGNOSIS — F411 Generalized anxiety disorder: Secondary | ICD-10-CM

## 2024-04-19 DIAGNOSIS — F4310 Post-traumatic stress disorder, unspecified: Secondary | ICD-10-CM

## 2024-04-20 ENCOUNTER — Telehealth (HOSPITAL_COMMUNITY): Payer: Self-pay

## 2024-04-20 NOTE — Progress Notes (Signed)
 ADVANCED HEART FAILURE CLINIC NOTE  Primary Care: Vicci Barnie NOVAK, MD Primary Cardiologist: Dr. Pietro EP: Dr. [name] Carder: Dr. Meade Nephrology: Dr. Jerrye HF Cardiologist: Dr. Zenaida  HPI: [name] is a 58 y.o. male with HFrEF (dx 2015) w/ LHC demonstrating normal coronary anatomy s/p primary prevention ICD, OSA unable to tolerate CPAP, CKD IIIb & T2DM.   In 2017 he had a CPX with w/ moderate functional impairment, peak VO2 of 15, VE/VCO2 of 35. From a functional standpoint, [name] reports running 2-3 miles daily until he was diagnosed with 'long covid. Since that time he has seen a sharp decline in exercise capacity.   In 11/23, he was admitted for VT believed to be secondary to significant electrolyte derrangements from and underlying right gluteal abscess. Throughout his admission he had recurrent device shocks for VT. He underwent RHC/LHC that demonstrated normal coronary anatomy with RHC showing normal cardiac output and low filling pressures. He subsequently underwent placement of right atrial lead with improvement in ectopy after atrial pacing. TTE during admission with LVEF of 30-35%.   Echo 3/24 EF 20-25%, moderate LVH, RV okay.    Admitted 10/24 with groin cellulitis. Treated with IV antibiotics. He is now off Farxiga .   Readmitted 11/24 with extensive left upper extremity DVT. He was started on Eliquis . Echo 11/24: EF 25-30%.   Echo 3/25: EF up to 40-45%, RV okay   Seen in ED 3/25 with RSV infection. A week later given course of doxycycline  d/t persistent symptoms.  Admitted 5/25 with AKI 2/2 decrease oral intake. SCr peaked at 4.6 (blaseline 2.7). GDMT held and received IVF. He was restarted on Toprol  and torsemide , discharged home, weight 279 lbs.  Admitted 7/25 with a/c CHF, RLE DVT, gout, left-sided chest pain, left shoulder/back pain d/t osteoarthritis/possible gout. Diuresed and GDMT titrated. Chest pain felt to be most likely musculoskeletal in  origin. Echo during admit with LVEF 40-45%, moderate LVH, RV okay.  Admitted 9/25 after a syncopal episode d/t polymorphic VT/VF, terminated w/ ATP. K was 2.8 on arrival, in the setting of metolazone  use (not taking K). He was started on amiodarone . cMRI was attempted to r/o infiltrative process however pt unable to complete due to claustrophobia.    Admitted again 10/25 with chest pain. ECG w/o STE, trops negative. Cr was up to 3.4 on admission, he was given IVF and antibiotics due to concern for PNA seen on CT chest. Chest pain also attributed to gout in L shoulder (uric acid 15.2. He was treated with pred and allopurinol and discharged home.   He returns today for heart failure follow up. Overall feeling fatigued. NYHA III. Reports dyspnea, fatigue, and palpitations. Feels that his COPD has been worsening his symptoms as well. Denies chest pain, orthopnea, and abnormal bleeding. Able to perform ADLs. Appetite okay. Weight at home up. Does not take BP at home. Compliant with all medications.  Current Outpatient Medications  Medication Sig Dispense Refill   acetaminophen -codeine  (TYLENOL  #3) 300-30 MG tablet Take 1 tablet by mouth every 8 (eight) hours as needed for moderate pain (pain score 4-6).     allopurinol (ZYLOPRIM) 100 MG tablet Take 1 tablet (100 mg total) by mouth daily. 30 tablet 1   amiodarone  (PACERONE ) 200 MG tablet Take 2 tablets (400 mg) twice daily for 2 weeks. Then on October 5, decrease to 1 tablet (200 mg) once daily. 40 tablet 11   atorvastatin  (LIPITOR) 10 MG tablet Take 1 tablet (10 mg total) by  mouth daily. 90 tablet 2   benzonatate  (TESSALON ) 200 MG capsule Take 200 mg by mouth 2 (two) times daily as needed for cough.     cetirizine  (EQ ALLERGY RELIEF, CETIRIZINE ,) 10 MG tablet Take 1 tablet (10 mg total) by mouth daily. 90 tablet 1   cholecalciferol  (VITAMIN D3) 25 MCG (1000 UNIT) tablet Take 1,000 Units by mouth in the morning.     clonazePAM  (KLONOPIN ) 0.5 MG tablet  Take 1 tablet by mouth twice daily as needed for anxiety 60 tablet 0   Clotrimazole  1 % OINT Apply 1 Application topically 2 (two) times daily as needed (irritation).     colchicine  0.6 MG tablet Take 0.5 tablets (0.3 mg total) by mouth every Monday, Wednesday, and Friday.     Continuous Glucose Sensor (FREESTYLE LIBRE 2 PLUS SENSOR) MISC Change sensor every 15 days. 2 each 11   EPINEPHrine  0.3 mg/0.3 mL IJ SOAJ injection Inject 0.3 mg into the muscle as needed. 1 each 1   fluticasone  (FLONASE ) 50 MCG/ACT nasal spray Place 2 sprays into both nostrils daily. (Patient not taking: Reported on 04/07/2024) 16 g 6   gabapentin  (NEURONTIN ) 300 MG capsule Take 1 capsule (300 mg total) by mouth at bedtime. 90 capsule 1   HYDROcodone -acetaminophen  (NORCO/VICODIN) 5-325 MG tablet Take 1-2 tablets by mouth every 4 (four) hours as needed for moderate pain (pain score 4-6) or severe pain (pain score 7-10). 20 tablet 0   insulin  aspart (NOVOLOG  FLEXPEN) 100 UNIT/ML FlexPen Inject 22 Units into the skin 2 (two) times daily.     insulin  glargine (LANTUS  SOLOSTAR) 100 UNIT/ML Solostar Pen Inject 30 Units into the skin at bedtime.     Insulin  Pen Needle (TECHLITE PEN NEEDLES) 32G X 4 MM MISC Use to inject insulin  into the skin 3 times per day. 100 each 0   isosorbide mononitrate (IMDUR) 30 MG 24 hr tablet Take 0.5 tablets (15 mg total) by mouth daily. 30 tablet 1   latanoprost (XALATAN) 0.005 % ophthalmic solution Place 1 drop into both eyes at bedtime.     magnesium  oxide (MAG-OX) 400 MG tablet Take 1 tablet (400 mg total) by mouth daily. 90 tablet 3   metolazone  (ZAROXOLYN ) 5 MG tablet Take 1 tablet (5 mg total) by mouth once a week. 12 tablet 2   metoprolol  succinate (TOPROL -XL) 25 MG 24 hr tablet Take 0.5 tablets (12.5 mg total) by mouth daily. 45 tablet 3   nystatin  (MYCOSTATIN /NYSTOP ) powder Apply 1 Application topically 3 (three) times daily. 60 g 2   pantoprazole  (PROTONIX ) 40 MG tablet Take 1 tablet (40 mg  total) by mouth daily. 90 tablet 1   potassium chloride  SA (KLOR-CON  M) 20 MEQ tablet Take 2 tablets (40 mEq total) by mouth 2 (two) times daily. Take an extra 20 mEq when you take Metolazone . 120 tablet 11   prednisoLONE acetate (PRED FORTE) 1 % ophthalmic suspension Place 1 drop into the left eye at bedtime.     rivaroxaban  (XARELTO ) 20 MG TABS tablet Take 1 tablet (20 mg total) by mouth daily with supper. 30 tablet 2   sertraline  (ZOLOFT ) 50 MG tablet Take 1.5 tablets (75 mg total) by mouth in the morning. 45 tablet 2   SYMBICORT  160-4.5 MCG/ACT inhaler Inhale 2 puffs into the lungs in the morning and at bedtime.     tamsulosin  (FLOMAX ) 0.4 MG CAPS capsule Take 2 capsules (0.8 mg total) by mouth daily. 180 capsule 3   torsemide  (DEMADEX ) 20 MG tablet Take  2 tablets (40 mg total) by mouth daily. 180 tablet 3   No current facility-administered medications for this visit.   Wt Readings from Last 3 Encounters:  04/01/24 124.7 kg (275 lb)  03/30/24 123.7 kg (272 lb 9.6 oz)  03/28/24 124.7 kg (275 lb)   There were no vitals taken for this visit.  Physical Exam  General: Well appearing. No distress  Cardiac: JVP difficult to assess. No murmurs  Abdomen: Soft, non-distended.  Extremities: Warm and dry.  No peripheral edema.  Neuro: A&O x3. Affect pleasant.   DATA REVIEW  ECG: 10/25: NSR  85 bpm   ECHO: 10/25: EF 45-50%, mild LVH, nl RV 9/25: 45-50%, RV normal, severe LVH  7/25: EF 40-45%, moderate LVH, RV okay 3/25: EF 40-45%, moderate LVH, normal RV 11/24 LVEF 25%-30%, normal RV function.  3/24: LVEF 35%, normal RV function 11/23: LVEF 30-35%, normal RV function.  9/20: LVEF 45-50%, normal RV function.   CATH: 12/23 RHC: RA 1, PA 23/6 (12), PCW 4, Fick CO/CI 7/2.9, PVR ~1, PAPi >5 12/23 LHC: No obstructive CAD, left dominant w/ large Lcx supplying multiple large marginals.   ICD interrogation (personally reviewed): Volume trending up by impedence graph, No VT/VF, no AT/AF, <1%  V paced  ASSESSMENT & PLAN:  Heart failure with reduced EF>>now HFmrEF - Nonischemic as demonstrated by coronary angiography 12/23; no family history  - NYHA III, chronic and confounded by obesity/deconditioning  - Euvolemic on exam and by device interrogation/CorVue  - Take an additional metolazone  2.5 today with 40 KCL - Increase torsemide  to 60 mg daily. Continue metolazone  once weekly on Mondays - Off Entresto  and spiro with CKD - Off SGLT2i w/ groin cellulitis - Stop toprol  with hypotension - Stop hydral with hypotension - BMET/BNP today, then in 1 week  2. VT - had episode 9/25 that terminated w/ ATP, in the setting of hypokalemia (2.8) - no further VT episodes on today's device interrogation  - Continue amiodarone  200 mg daily.  - Will need regular eye exam while on amio - Check BMP today   3. DM II - Uncontrolled, last A1c 8.9 - Avoid SGLT2i with hx of groin infections - Per PCP  4. CKD IV  - Baseline SCr 2.5-2.7 - followed by nephrology    5. Hx DVT - Recurrent - Recently found to have RLE DVT while on Eliquis  - on Xarelto  20 mg daily.  - followed by hematology  6. OSA - severe by sleep study - just received new machine. Unable to tolerate. He has been sent to ENT for possible Inspire device   7. COPD - Follows with Dr. Meade - On Symbicort  - will message clinic for post hospital appointment with Pulm  Follow up in 4 weeks with APP  Gladie Gravette, NP 04/20/24

## 2024-04-20 NOTE — Telephone Encounter (Signed)
 Called to confirm/remind patient of their appointment at the [facility] on 04/21/24.   Appointment:   [x] Confirmed  [] Left mess   [] No answer/No voice mail  [] VM Full/unable to leave message  [] Phone not in service  Patient reminded to bring all medications and/or complete list.  Confirmed patient has transportation. Gave directions, instructed to utilize valet parking.

## 2024-04-21 ENCOUNTER — Ambulatory Visit (HOSPITAL_COMMUNITY)
Admit: 2024-04-21 | Discharge: 2024-04-21 | Disposition: A | Discharge status: Home / Self Care | Source: Ambulatory Visit | Attending: Cardiology | Admitting: Cardiology

## 2024-04-21 ENCOUNTER — Ambulatory Visit (HOSPITAL_COMMUNITY): Payer: Self-pay | Admitting: Cardiology

## 2024-04-21 ENCOUNTER — Other Ambulatory Visit: Payer: Self-pay | Admitting: Internal Medicine

## 2024-04-21 ENCOUNTER — Encounter (HOSPITAL_COMMUNITY): Payer: Self-pay

## 2024-04-21 DIAGNOSIS — Z7901 Long term (current) use of anticoagulants: Secondary | ICD-10-CM | POA: Insufficient documentation

## 2024-04-21 DIAGNOSIS — G4733 Obstructive sleep apnea (adult) (pediatric): Secondary | ICD-10-CM | POA: Insufficient documentation

## 2024-04-21 DIAGNOSIS — E1165 Type 2 diabetes mellitus with hyperglycemia: Secondary | ICD-10-CM | POA: Insufficient documentation

## 2024-04-21 DIAGNOSIS — J449 Chronic obstructive pulmonary disease, unspecified: Secondary | ICD-10-CM | POA: Insufficient documentation

## 2024-04-21 DIAGNOSIS — Z794 Long term (current) use of insulin: Secondary | ICD-10-CM | POA: Insufficient documentation

## 2024-04-21 DIAGNOSIS — I472 Ventricular tachycardia, unspecified: Secondary | ICD-10-CM | POA: Insufficient documentation

## 2024-04-21 DIAGNOSIS — E119 Type 2 diabetes mellitus without complications: Secondary | ICD-10-CM

## 2024-04-21 DIAGNOSIS — I5022 Chronic systolic (congestive) heart failure: Secondary | ICD-10-CM | POA: Diagnosis not present

## 2024-04-21 DIAGNOSIS — E1122 Type 2 diabetes mellitus with diabetic chronic kidney disease: Secondary | ICD-10-CM | POA: Diagnosis not present

## 2024-04-21 DIAGNOSIS — Z86718 Personal history of other venous thrombosis and embolism: Secondary | ICD-10-CM | POA: Diagnosis not present

## 2024-04-21 DIAGNOSIS — Z95810 Presence of automatic (implantable) cardiac defibrillator: Secondary | ICD-10-CM | POA: Diagnosis present

## 2024-04-21 DIAGNOSIS — Z79899 Other long term (current) drug therapy: Secondary | ICD-10-CM | POA: Insufficient documentation

## 2024-04-21 DIAGNOSIS — N184 Chronic kidney disease, stage 4 (severe): Secondary | ICD-10-CM | POA: Insufficient documentation

## 2024-04-21 LAB — BASIC METABOLIC PANEL WITH GFR
Anion gap: 20 — ABNORMAL HIGH (ref 5–15)
BUN: 30 mg/dL — ABNORMAL HIGH (ref 6–20)
CO2: 25 mmol/L (ref 22–32)
Calcium: 9.2 mg/dL (ref 8.9–10.3)
Chloride: 95 mmol/L — ABNORMAL LOW (ref 98–111)
Creatinine, Ser: 3.15 mg/dL — ABNORMAL HIGH (ref 0.61–1.24)
GFR, Estimated: 22 mL/min — ABNORMAL LOW (ref >60)
Glucose, Bld: 217 mg/dL — ABNORMAL HIGH (ref 70–99)
Potassium: 3.6 mmol/L (ref 3.5–5.1)
Sodium: 140 mmol/L (ref 135–145)

## 2024-04-21 LAB — BRAIN NATRIURETIC PEPTIDE: B Natriuretic Peptide: 20.6 pg/mL (ref 0.0–100.0)

## 2024-04-21 MED ORDER — ALLOPURINOL 100 MG PO TABS
100.0000 mg | ORAL_TABLET | Freq: Every day | ORAL | 2 refills | Status: AC
Start: 2024-04-21 — End: ?

## 2024-04-21 MED ORDER — TORSEMIDE 20 MG PO TABS: 60.0000 mg | ORAL_TABLET | Freq: Every day | ORAL | 3 refills | Status: AC

## 2024-04-21 NOTE — Addendum Note (Signed)
 Encounter addended by: Dionicio Shelnutt B, RN on: 04/21/2024 4:57 PM  Actions taken: Diagnosis association updated, Order list changed

## 2024-04-21 NOTE — Patient Instructions (Addendum)
 Good to see you today!   STOP Imdur  STOP Toprol   INCREASE torsemide  60 mg daily(3 tablets) daily  Take metolazone  today with additional 40 meq potassium  Labs done today, your results will be available in MyChart, we will contact you for abnormal readings.  Repeat lab work in 1 week as scheduled  Your physician recommends that you schedule a follow-up appointment as scheduled  If you have any questions or concerns before your next appointment please send us  a message through [place] or call our office at [phone].    TO LEAVE A MESSAGE FOR THE NURSE SELECT OPTION 2, PLEASE LEAVE A MESSAGE INCLUDING: YOUR NAME DATE OF BIRTH CALL BACK NUMBER REASON FOR CALL**this is important as we prioritize the call backs  YOU WILL RECEIVE A CALL BACK THE SAME DAY AS LONG AS YOU CALL BEFORE 4:00 PM At the [facility], you and your health needs are our priority. As part of our continuing mission to provide you with exceptional heart care, we have created designated Provider Care Teams. These Care Teams include your primary Cardiologist (physician) and Advanced Practice Providers (APPs- Physician Assistants and Nurse Practitioners) who all work together to provide you with the care you need, when you need it.   You may see any of the following providers on your designated Care Team at your next follow up: Dr Toribio Fuel Dr Ezra Shuck Dr. Morene Brownie Greig Mosses, NP Caffie Shed, [place] [facility] [place], [place] Beckey Coe, NP Jordan Lee, NP Ellouise Class, NP Tinnie Redman, PharmD Jaun Bash, PharmD   Please be sure to bring in all your medications bottles to every appointment.    Thank you for choosing [facility]

## 2024-04-21 NOTE — Telephone Encounter (Signed)
 Copied from CRM #[id number]. Topic: Clinical - Prescription Issue >> Apr 21, 2024 11:42 AM Treva T wrote: Reason for CRM: Received call from pt, reports he was prescribed medication while in hospital, by Dr. Dorinda.  Medication: allopurinol (ZYLOPRIM) 100 MG tablet.  Per patient states, needs medication sent to pharmacy. Patient reports medication was sent to incorrect pharmacy.  Per chart review, medication sent to incorrect pharmacy: [facility] [address], [place] [address] Phone: [phone]  Fax: [phone]  Is requesting medication be sent to preferred pharmacy as listed:   [facility] [address] [place], [place] - [address] [place] [address] Phone: [phone] Fax: [phone]   Can be reached at [phone] if need to discuss further.

## 2024-04-22 NOTE — Progress Notes (Signed)
 Assessment/Plan:  1.  Tremor  -I did tell the patient that while he may have essential tremor, because he is also on amiodarone  it would be difficult to rule out that as the cause of tremor, or at least a significant source of exacerbation of tremor.  I told the patient that about 1/3 of patients who are on amiodarone  will have some degree of tremor.  I did not advise the patient stop or alter the medication in any way, but did tell the patient that if the medication is stopped for any reason, it can take up to 6 months to know if the tremor was from the medication.  - Discussed with patient that I find that amiodarone -induced tremor really does not respond to medications known to treat tremor.  In addition, he cannot be on primidone because of the Xarelto  interaction.  He is already on gabapentin .  Given his cardiac issues, I do not want to put him on a beta-blocker.  Patient does not disagree.   2.  Congestive heart failure  - Nonischemic.  History of cardiac arrest.  Has defibrillator.  Has had few recent syncopal episodes because of V. tach.  - Follows with cardiology  - On amiodarone   3.  History of DVT  - Recurrent while on Eliquis  and was switched to Xarelto   4.  CKD 4  - Followed by nephrology  5.  Uncontrolled diabetes  - Followed by endocrinology  6.  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  We did talk about doing neurocognitive testing and MRI of the brain, but patient declined for now.  Subjective:   [name] was seen today in the movement disorders clinic for neurologic consultation at the request of Vicci Barnie NOVAK, MD.  The consultation is for the evaluation of tremor.  Medical records made available to me are reviewed.  Patient is a 58 year old male with a history of diabetes mellitus,  GAD, PTSD, panic attacks, gout, CHF, nonischemic cardiomyopathy with defibrillator - s/p cardiac arrest, CKD 4, DVT on Xarelto  who presents today for  evaluation of tremor.  Tremor: Yes.     How long has it been going on? 1 year  At rest or with activation?  activation  When is it noted the most?  Any activity  Fam hx of tremor?  Yes.  , paternal GF  Located where?  Bilateral UE  Affected by caffeine:  doesn't drink any  Affected by alcohol:  doesn't drink any  Affected by stress:  No.  Affected by fatigue:  hard to state  Spills soup if on spoon:  unknown  Spills glass of liquid if full:  may/may not  Affects ADL's (tying shoes, brushing teeth, etc):  (has trouble cutting his own nails or caregiving for his mom and doing the same)  Tremor inducing meds:  amiodarone  (on since 2023)  Potential tremor improving medications: Gabapentin  - 300 mg q hs for PN, clonazepam  - 0.5 mg bid,  Other Specific Symptoms:  Voice: raspy but always has been Postural symptoms:  Yes.    Falls?  Yes.  , some were balance loss and 1 was syncope  Urinary Incontinence:  No. Difficulty Swallowing:  Yes.   (Has intermittent trouble with any consistency of food - has upcoming appt with GI) Memory changes:  Yes.   (May have trouble remembering day of week or what he did day prior); usually remembers to take medications but sometimes doesn't feel like taking them;  no trouble driving; lives/caregives for brother but they are moving apart soon; no dysphasia Hallucinations:  No.  visual distortions: Yes.   N/V:  No. Diplopia:  No.  Neuroimaging of the brain has not previously been performed.      ALLERGIES:   Allergies  Allergen Reactions   Bee Venom Anaphylaxis   Trulicity  [Dulaglutide ] Anaphylaxis and Diarrhea    Extra dose of Trulicity  caused cardiac arrest. Patient experience onset of diarrhea with using this medication - cleared after med stopped.    CURRENT MEDICATIONS:  Current Outpatient Medications  Medication Instructions   acetaminophen -codeine  (TYLENOL  #3) 300-30 MG tablet 1 tablet, Every 8 hours PRN   allopurinol (ZYLOPRIM) 100 mg, Oral,  Daily   amiodarone  (PACERONE ) 200 MG tablet Take 2 tablets (400 mg) twice daily for 2 weeks. Then on October 5, decrease to 1 tablet (200 mg) once daily.   atorvastatin  (LIPITOR) 10 mg, Oral, Daily   benzonatate  (TESSALON ) 200 mg, 2 times daily PRN   cetirizine  (EQ ALLERGY RELIEF (CETIRIZINE )) 10 mg, Oral, Daily   cholecalciferol  (VITAMIN D3) 1,000 Units, Every morning   clonazePAM  (KLONOPIN ) 0.5 mg, Oral, 2 times daily PRN, for anxiety   Clotrimazole  1 % OINT 1 Application, Topical, 2 times daily PRN   colchicine  0.3 mg, Oral, Every M-W-F   Continuous Glucose Sensor (FREESTYLE LIBRE 2 PLUS SENSOR) MISC Change sensor every 15 days.   EPINEPHrine  (EPI-PEN) 0.3 mg, Intramuscular, As needed   fluticasone  (FLONASE ) 50 MCG/ACT nasal spray 2 sprays, Each Nare, Daily   gabapentin  (NEURONTIN ) 300 mg, Oral, Daily at bedtime   HYDROcodone -acetaminophen  (NORCO/VICODIN) 5-325 MG tablet 1-2 tablets, Oral, Every 4 hours PRN   Insulin  Pen Needle (TECHLITE PEN NEEDLES) 32G X 4 MM MISC Use to inject insulin  into the skin 3 times per day.   Lantus  SoloStar 30 Units, Subcutaneous, Daily at bedtime   latanoprost (XALATAN) 0.005 % ophthalmic solution 1 drop, Daily at bedtime   magnesium  oxide (MAG-OX) 400 mg, Oral, Daily   metolazone  (ZAROXOLYN ) 5 mg, Oral, Weekly   NovoLOG  FlexPen 22 Units, Subcutaneous, 2 times daily   nystatin  (MYCOSTATIN /NYSTOP ) powder 1 Application, Topical, 3 times daily   pantoprazole  (PROTONIX ) 40 mg, Oral, Daily   potassium chloride  SA (KLOR-CON  M) 20 MEQ tablet 40 mEq, Oral, 2 times daily, Take an extra 20 mEq when you take Metolazone .   prednisoLONE acetate (PRED FORTE) 1 % ophthalmic suspension 1 drop, Daily at bedtime   rivaroxaban  (XARELTO ) 20 mg, Oral, Daily with supper   sertraline  (ZOLOFT ) 75 mg, Oral, Every morning   SYMBICORT  160-4.5 MCG/ACT inhaler 2 puffs, 2 times daily   tamsulosin  (FLOMAX ) 0.8 mg, Oral, Daily   torsemide  (DEMADEX ) 60 mg, Oral, Daily    Objective:    PHYSICAL EXAMINATION:    VITALS:   Vitals:   04/25/24 1308  BP: 123/74  Pulse: (!) 106  SpO2: 98%  Weight: 279 lb 3.2 oz (126.6 kg)    GEN:  The patient appears stated age and is in NAD. HEENT:  Normocephalic, atraumatic.  The mucous membranes are moist. The superficial temporal arteries are without ropiness or tenderness. CV: Tachycardic.  Regular.   Lungs:  CTAB.  There is DOE Neck/HEME:  There are no carotid bruits bilaterally.  Neurological examination:  Orientation: The patient is alert and oriented x3.  Cranial nerves: There is good facial symmetry.  Extraocular muscles are intact. The visual fields are full to confrontational testing. The speech is fluent and clear. Soft palate rises symmetrically and  there is no tongue deviation. Hearing is intact to conversational tone. Sensation: Sensation is intact to light touch throughout (facial, trunk, extremities). Vibration is intact at the bilateral big toe. There is no extinction with double simultaneous stimulation.  Motor: Strength is 5/5 in the bilateral upper and lower extremities.   Shoulder shrug is equal and symmetric.  There is no pronator drift.   Movement examination: Tone: There is nl tone in the bilateral upper extremities.  The tone in the lower extremities is nl.  Abnormal movements: there is no rest tremor.  There is mild postural tremor on the L>R that increases with intention.  Mild tremor with archimedes spirals.   Coordination:  There is no decremation with RAM's Gait and Station: The patient pushes off to arise.  He is slow and tenuous and appears to be in somewhat pain when he first gets up.  He is slow to ambulate but steady. I have reviewed and interpreted the following labs independently   Chemistry      Component Value Date/Time   NA 140 04/21/2024 1432   NA 140 03/28/2024 1632   K 3.6 04/21/2024 1432   CL 95 (L) 04/21/2024 1432   CO2 25 04/21/2024 1432   BUN 30 (H) 04/21/2024 1432   BUN 33 (H)  03/28/2024 1632   CREATININE 3.15 (H) 04/21/2024 1432   CREATININE 1.68 (H) 04/24/2016 1502      Component Value Date/Time   CALCIUM  9.2 04/21/2024 1432   ALKPHOS 90 03/28/2024 1632   AST 18 03/28/2024 1632   ALT 24 03/28/2024 1632   BILITOT 0.5 03/28/2024 1632      Lab Results  Component Value Date   TSH 1.220 03/28/2024   Lab Results  Component Value Date   WBC 8.3 03/28/2024   HGB 13.0 03/28/2024   HCT 38.5 03/28/2024   MCV 89 03/28/2024   PLT 409 03/28/2024      Total time spent on today's visit was 60 minutes, including both face-to-face time and nonface-to-face time.  Time included that spent on review of records (prior notes available to me/labs/imaging if pertinent), discussing treatment and goals, answering patient's questions and coordinating care.  Cc:  Vicci Barnie NOVAK, MD

## 2024-04-25 ENCOUNTER — Other Ambulatory Visit (HOSPITAL_COMMUNITY): Payer: Self-pay | Admitting: Psychiatry

## 2024-04-25 ENCOUNTER — Encounter: Payer: Self-pay | Admitting: Neurology

## 2024-04-25 ENCOUNTER — Ambulatory Visit: Admitting: Neurology

## 2024-04-25 VITALS — BP 123/74 | HR 106 | Wt 279.2 lb

## 2024-04-25 DIAGNOSIS — I428 Other cardiomyopathies: Secondary | ICD-10-CM

## 2024-04-25 DIAGNOSIS — G251 Drug-induced tremor: Secondary | ICD-10-CM

## 2024-04-25 DIAGNOSIS — I5022 Chronic systolic (congestive) heart failure: Secondary | ICD-10-CM | POA: Diagnosis not present

## 2024-04-25 DIAGNOSIS — R413 Other amnesia: Secondary | ICD-10-CM | POA: Diagnosis not present

## 2024-04-25 DIAGNOSIS — F411 Generalized anxiety disorder: Secondary | ICD-10-CM

## 2024-04-25 DIAGNOSIS — F4310 Post-traumatic stress disorder, unspecified: Secondary | ICD-10-CM

## 2024-04-25 NOTE — Patient Instructions (Signed)
 Good to see you today.    We discussed that your tremor may be coming from your amiodarone  and that this can last for a long time (6 months) even if amiodarone  is stopped/decreased.  You can talk with Dr. Pietro about this.  We talked about MRI brain and neurocognitive testing but you decided to hold on that for now.  The physicians and staff at [facility] are committed to providing excellent care. You may receive a survey requesting feedback about your experience at our office. We strive to receive very good responses to the survey questions. If you feel that your experience would prevent you from giving the office a very good  response, please contact our office to try to remedy the situation. We may be reached at [phone]. Thank you for taking the time out of your busy day to complete the survey.

## 2024-04-26 ENCOUNTER — Other Ambulatory Visit: Payer: Self-pay

## 2024-04-26 ENCOUNTER — Ambulatory Visit: Admitting: Physical Medicine and Rehabilitation

## 2024-04-26 VITALS — BP 115/86 | HR 106

## 2024-04-26 DIAGNOSIS — G8929 Other chronic pain: Secondary | ICD-10-CM

## 2024-04-26 DIAGNOSIS — M461 Sacroiliitis, not elsewhere classified: Secondary | ICD-10-CM | POA: Diagnosis not present

## 2024-04-26 DIAGNOSIS — M25561 Pain in right knee: Secondary | ICD-10-CM | POA: Diagnosis not present

## 2024-04-26 DIAGNOSIS — M25562 Pain in left knee: Secondary | ICD-10-CM | POA: Diagnosis not present

## 2024-04-26 NOTE — Progress Notes (Signed)
 Pain Scale   Average Pain 10 Patient advising he has bilateral S1 joint areas patient advising hes pain is constant.        +Driver, -BT, -Dye Allergies.

## 2024-04-28 ENCOUNTER — Ambulatory Visit (HOSPITAL_COMMUNITY)

## 2024-04-28 ENCOUNTER — Encounter: Payer: Self-pay | Admitting: Physician Assistant

## 2024-04-29 ENCOUNTER — Ambulatory Visit (HOSPITAL_COMMUNITY)
Admission: RE | Admit: 2024-04-29 | Discharge: 2024-04-29 | Disposition: A | Discharge status: Home / Self Care | Source: Ambulatory Visit | Attending: Cardiology | Admitting: Cardiology

## 2024-04-29 DIAGNOSIS — I5022 Chronic systolic (congestive) heart failure: Secondary | ICD-10-CM | POA: Insufficient documentation

## 2024-04-29 LAB — BASIC METABOLIC PANEL WITH GFR
Anion gap: 16 — ABNORMAL HIGH (ref 5–15)
BUN: 36 mg/dL — ABNORMAL HIGH (ref 6–20)
CO2: 30 mmol/L (ref 22–32)
Calcium: 9.3 mg/dL (ref 8.9–10.3)
Chloride: 95 mmol/L — ABNORMAL LOW (ref 98–111)
Creatinine, Ser: 2.87 mg/dL — ABNORMAL HIGH (ref 0.61–1.24)
GFR, Estimated: 24 mL/min — ABNORMAL LOW (ref >60)
Glucose, Bld: 202 mg/dL — ABNORMAL HIGH (ref 70–99)
Potassium: 3.4 mmol/L — ABNORMAL LOW (ref 3.5–5.1)
Sodium: 141 mmol/L (ref 135–145)

## 2024-05-01 DIAGNOSIS — M25562 Pain in left knee: Secondary | ICD-10-CM

## 2024-05-01 DIAGNOSIS — G8929 Other chronic pain: Secondary | ICD-10-CM | POA: Diagnosis not present

## 2024-05-01 DIAGNOSIS — M25561 Pain in right knee: Secondary | ICD-10-CM | POA: Diagnosis not present

## 2024-05-01 DIAGNOSIS — M461 Sacroiliitis, not elsewhere classified: Secondary | ICD-10-CM | POA: Diagnosis not present

## 2024-05-01 MED ORDER — BUPIVACAINE HCL 0.25 % IJ SOLN
2.0000 mL | INTRAMUSCULAR | Status: AC | PRN
  Administered 2024-05-01: 2 mL via INTRA_ARTICULAR

## 2024-05-01 MED ORDER — TRIAMCINOLONE ACETONIDE 40 MG/ML IJ SUSP
40.0000 mg | INTRAMUSCULAR | Status: AC | PRN
  Administered 2024-05-01: 40 mg via INTRA_ARTICULAR

## 2024-05-01 NOTE — Progress Notes (Signed)
 [name] - 58 y.o. male MRN [id number]  Date of birth: 1966-05-09  Office Visit Note: Visit Date: 04/26/2024 PCP: Vicci Barnie NOVAK, MD Referred by: Vicci Barnie NOVAK, MD  Subjective: Chief Complaint  Patient presents with   Lower Back - Pain   HPI:  [name] is a 58 y.o. male who comes in today at the request of Duwaine Pouch, FNP for planned Bilateral anesthetic Sacroiliac joint arthrogram with fluoroscopic guidance.  The patient has failed conservative care including home exercise, medications, time and activity modification.  This injection will be diagnostic and hopefully therapeutic.  Please see requesting physician notes for further details and justification.   Positive Fortin finger sign, Patrick's testing.  Complaining of knee pain. Has not had evaluation. Saw Dr. Barbarann in the past.   ROS Otherwise per HPI.  Assessment & Plan: Visit Diagnoses:    ICD-10-CM   1. Sacroiliitis  M46.1 XR C-ARM NO REPORT    2. Chronic pain of both knees  M25.561 Ambulatory referral to Orthopedic Surgery   M25.562    G89.29       Plan: Findings:  1. Bilateral SI joint injections. Consider ESI.  2. Knee pain. Will refer to Dr. Addie    Meds & Orders: No orders of the defined types were placed in this encounter.   Orders Placed This Encounter  Procedures   Sacroiliac Joint Inj   XR C-ARM NO REPORT   Ambulatory referral to Orthopedic Surgery    Follow-up: No follow-ups on file.   Procedures: Sacroiliac Joint Inj on 05/01/2024 7:03 PM Indications: pain and diagnostic evaluation Details: 22 G 3.5 in needle, fluoroscopy-guided posterior approach Medications (Right): 40 mg triamcinolone  acetonide 40 MG/ML; 2 mL bupivacaine  0.25 % Medications (Left): 40 mg triamcinolone  acetonide 40 MG/ML; 2 mL bupivacaine  0.25 % Outcome: tolerated well, no immediate complications  Sacroiliac Joint Intra-Articular Injection - Posterior Approach with Fluoroscopic Guidance   Position:  PRONE  Additional Comments: Vital signs were monitored before and after the procedure. Patient was prepped and draped in the usual sterile fashion. The correct patient, procedure, and site was verified.   Injection Procedure Details:   Location/Site:  Sacroiliac joint  Needle size: 3.5 in Spinal Needle  Needle type: Spinal  Needle Placement: Intra-articular  Findings:  -Comments: There was excellent flow of contrast producing a partial arthrogram of the sacroiliac joint.   Procedure Details: Starting with a 90 degree vertical and midline orientation the fluoroscope was tilted cranially 20 to 25 degrees and the target area of the inferior most part of the SI joint on the side mentioned above was visualized.  The soft tissues overlying this target were infiltrated with 4 ml. of 1% Lidocaine  without Epinephrine . A #22 gauge spinal needle was inserted perpendicular to the fluoroscope table and advanced into the posterior inferior joint space using fluoroscopic guidance.  Position in the joint space was confirmed by obtaining a partial arthrogram using a 2 ml. volume of Isovue -250 contrast agent. After negative aspirate for gross pus or blood, the injectate was delivered to the joint. Radiographs were obtained for documentation purposes.   Additional Comments:   Dressing: Bandaid    Post-procedure details: Patient was observed during the procedure. Post-procedure instructions were reviewed.  Patient left the clinic in stable condition.    There was excellent flow of contrast producing a partial arthrogram of the sacroiliac joint.  Procedure, treatment alternatives, risks and benefits explained, specific risks discussed. Consent was given by the patient. Immediately  prior to procedure a time out was called to verify the correct patient, procedure, equipment, support staff and site/side marked as required. Patient was prepped and draped in the usual sterile fashion.           Clinical History: CLINICAL DATA:  Low back pain, symptoms persist with > 6 wks treatment   EXAM: CT LUMBAR SPINE WITHOUT CONTRAST   TECHNIQUE: Multidetector CT imaging of the lumbar spine was performed without intravenous contrast administration. Multiplanar CT image reconstructions were also generated.   RADIATION DOSE REDUCTION: This exam was performed according to the departmental dose-optimization program which includes automated exposure control, adjustment of the mA and/or kV according to patient size and/or use of iterative reconstruction technique.   COMPARISON:  None Available.   FINDINGS: Segmentation: 5 rib-bearing vertebral bodies.   Alignment: No substantial sagittal subluxation.   Vertebrae: Vertebral body heights are maintained.   Paraspinal and other soft tissues: Aorta bi-iliac calcific atherosclerosis.   Disc levels: Moderate multilevel degenerative change including degenerative disc height loss and endplate. At L2-L3 there is disc bulging with right subarticular disc protrusion which results in canal stenosis and right greater than left subarticular recess stenosis with potential for impingement. Additional disc protrusion at L4-L5 also narrows the canal. Lower lumbar facet arthropathy.   IMPRESSION: 1. At L2-L3, disc bulging with right subarticular disc protrusion that results in canal stenosis and right greater than left subarticular recess stenosis with the potential for impingement. An MRI could better assess the canal and foramina if clinically warranted. 2. Moderate multilevel degenerative disease and lower lumbar facet arthropathy. 3. No acute fracture or malalignment.     Electronically Signed   By: Gilmore GORMAN Molt M.D.   On: 06/14/2023 21:32     Objective:  VS:  HT:    WT:   BMI:     BP:115/86  HR:(!) 106bpm  TEMP: ( )  RESP:  Physical Exam   Imaging: No results found.

## 2024-05-02 ENCOUNTER — Ambulatory Visit: Attending: Cardiology

## 2024-05-02 DIAGNOSIS — I5022 Chronic systolic (congestive) heart failure: Secondary | ICD-10-CM

## 2024-05-02 DIAGNOSIS — G4733 Obstructive sleep apnea (adult) (pediatric): Secondary | ICD-10-CM | POA: Diagnosis not present

## 2024-05-02 DIAGNOSIS — Z95810 Presence of automatic (implantable) cardiac defibrillator: Secondary | ICD-10-CM

## 2024-05-03 ENCOUNTER — Inpatient Hospital Stay: Attending: Hematology and Oncology | Admitting: Hematology and Oncology

## 2024-05-03 NOTE — Progress Notes (Signed)
 EPIC Encounter for ICM Monitoring  Patient Name: [name] is a 58 y.o. male Date: 05/03/2024 Primary Care Physican: Vicci Barnie NOVAK, MD Primary Cardiologist: [facility]/HF clinic Electrophysiologist: [facility] 08/24/2023 Office Weight: 295 lbs    09/28/2023 Weight: 291 lbs       10/13/2023  Weight: 280.2 lbs discharge weight 10/20/2023 Weight:  Not weighing at home    10/23/2023 Weight: Pt reports 5/27 he thinks he was 285 lbs at home 11/03/2023 Weight: 294 lbs (10 lb weight gain since 5/16) 12/15/2023 Office Weight: 289 lbs 01/02/2024 Hospital Weight: 285.5 lbs 01/13/2024 Weight: 277 lbs 02/02/2024 Weight: 280 lbs (was 274 lbs) 02/09/2024 Weight: 277 lbs 03/05/2024 Weight: 279 lbs (before 03/07/2024 Metolazone ) 04/13/2024 Weight: 268 -269 lbs 04/28/2024 Weight: 274 lbs   Spoke with patient and heart failure questions reviewed.  Transmission results reviewed.  Pt asymptomatic for fluid accumulation.  Reports feeling well at this time and voices no complaints.     Since 04/11/2024 ICM Remote Transmission:  Corvue thoracic impedance suggesting possible fluid accumulation from 04/16/2024-04/24/2024.   Trending baseline normal since 04/25/2024.   Prescribed dosage:  Torsemide  20 mg take 2 tablet(s) (40 mg total) by mouth daily.  Potassium 20 mEq take 2 tablet(s) (40 mEq total) by mouth twice a day Metolazone  5 mg take 1 tablet by mouth once a week.  Pt takes on Mondays (Nephrologist said he could take it up to twice a week).  Take additional 20 mEq potassium when taking Metolazone .   Labs: 03/28/2024 Creatinine 2.40, BUN 33, Potassium 4.6, Sodium 140, GFR 30 03/24/2024 Creatinine 2.51, BUN 59, Potassium 4.0, Sodium 136 03/23/2024 Creatinine 2.58, BUN 60, Potassium 3.3, Sodium 137, GFR 28  03/22/2024 Creatinine 2.64, BUN 62, Potassium 3.5, Sodium 135, GFR 27  03/21/2024 Creatinine 2.47, BUN 58, Potassium 3.7, Sodium 136, GFR 29  03/20/2024 Creatinine 2.61, BUN 54, Potassium 3.4, Sodium 137, GFR  27 03/19/2024 Creatinine 2.95, BUN 51, Potassium 3.7, Sodium 134, GFR 24  03/18/2024 Creatinine 3.48, BUN 53, Potassium 3.3, Sodium 137, GFR 19  03/16/2024 Creatinine 3.46, BUN 39, Potassium 3.7, Sodium 139, GFR 19  A complete set of results can be found in Results Review.   Recommendations:  No changes and encouraged to call if experiencing any fluid symptoms.   Follow-up plan: ICM clinic phone appointment on 06/06/2024.   91 day device clinic remote transmission 05/18/2024.     EP/Cardiology Office Visits:   05/23/2024 with HF Clinic.  Recall 09/24/2024 with Dr Inocencio.       Copy of ICM check sent to Dr Inocencio.    Remote monitoring is medically necessary for Heart Failure Management.    Daily Thoracic Impedance ICM trend: 02/02/2024 through 05/02/2024.    12-14 Month Thoracic Impedance ICM trend:     [name], RN 05/03/2024 2:42 PM

## 2024-05-07 ENCOUNTER — Other Ambulatory Visit: Payer: Self-pay | Admitting: Internal Medicine

## 2024-05-09 ENCOUNTER — Telehealth: Payer: Self-pay | Admitting: Physical Medicine and Rehabilitation

## 2024-05-09 NOTE — Telephone Encounter (Signed)
 Patient called and said he needs you to call him to schedule for spasms in his back. CB#[phone]

## 2024-05-12 ENCOUNTER — Encounter: Payer: Self-pay | Admitting: Physical Medicine and Rehabilitation

## 2024-05-12 ENCOUNTER — Ambulatory Visit: Admitting: Physical Medicine and Rehabilitation

## 2024-05-12 DIAGNOSIS — G894 Chronic pain syndrome: Secondary | ICD-10-CM

## 2024-05-12 DIAGNOSIS — M5442 Lumbago with sciatica, left side: Secondary | ICD-10-CM | POA: Diagnosis not present

## 2024-05-12 DIAGNOSIS — M5441 Lumbago with sciatica, right side: Secondary | ICD-10-CM | POA: Diagnosis not present

## 2024-05-12 DIAGNOSIS — M48062 Spinal stenosis, lumbar region with neurogenic claudication: Secondary | ICD-10-CM | POA: Diagnosis not present

## 2024-05-12 DIAGNOSIS — M5416 Radiculopathy, lumbar region: Secondary | ICD-10-CM

## 2024-05-12 DIAGNOSIS — G8929 Other chronic pain: Secondary | ICD-10-CM

## 2024-05-12 MED ORDER — ACETAMINOPHEN-CODEINE 300-30 MG PO TABS: 1.0000 | ORAL_TABLET | Freq: Three times a day (TID) | ORAL | 0 refills | Status: DC | PRN

## 2024-05-12 NOTE — Progress Notes (Unsigned)
 Pain in lower back that goes into both hips. Last injection only helped a little. Tylenol  3 takes the edge off.

## 2024-05-12 NOTE — Progress Notes (Unsigned)
 [name] - 58 y.o. male MRN [id number]  Date of birth: December 01, 1965  Office Visit Note: Visit Date: 05/12/2024 PCP: Vicci Barnie NOVAK, MD Referred by: Vicci Barnie NOVAK, MD  Subjective: Chief Complaint  Patient presents with   Lower Back - Pain   HPI: [name] is a 58 y.o. male who comes in today as a self referral for evaluation of chronic, worsening and severe bilateral lower back pain, intermittent radiation of pain to buttocks and posterior thighs down to knees. Pain ongoing for several years. He underwent bilateral sacroiliac joint injections in our office on 04/26/2024, reports less than 50% relief of pain for 1 week. His pain worsens with prolonged standing and walking. Recent CT of lumbar spine from 02/28/2024 shows mild-to-moderate canal stenosis at L2-L3. There is also central canal stenosis at L4-L5. We planned to perform L5-S1 interlaminar epidural steroid injection in February of this year, however patient had reservations regarding lumbar injection and elected to try SI joint injections again. He continues to take Tylenol  #3 with some relief of pain. He denies recent trauma or falls. Of note he was recently hospitalized in October for chest pain work up.   He has a fairly significant medical history including CHF, COPD, diabetes mellitus, diabetic polyneuropathy, chronic kidney disease, ICD, PTSD and chronic pain syndrome.        Review of Systems  Musculoskeletal:  Positive for back pain.  Neurological:  Negative for tingling, sensory change, focal weakness and weakness.  All other systems reviewed and are negative.  Otherwise per HPI.  Assessment & Plan: Visit Diagnoses:    ICD-10-CM   1. Chronic bilateral low back pain with bilateral sciatica  M54.42 Ambulatory referral to Physical Medicine Rehab   M54.41    G89.29     2. Lumbar radiculopathy  M54.16 Ambulatory referral to Physical Medicine Rehab    3. Chronic pain syndrome  G89.4 Ambulatory referral  to Physical Medicine Rehab    4. Spinal stenosis of lumbar region with neurogenic claudication  M48.062 Ambulatory referral to Physical Medicine Rehab       Plan: Findings:  Chronic, worsening and severe bilateral lower back pain, intermittent radiation of pain to buttocks and posterior thighs down to knees. Minimal relief of pain with recent bilateral SI joint injections. He continues to have severe pain with standing and walking. Patients clinical presentation and exam are consistent with neurogenic claudication as a result of spinal canal stenosis. There is spinal canal stenosis at the level of L2-L3 and L4-L5 on recent CT of lumbar spine from September of this year. We discussed treatment plan in detail today. Next step is to place order for L5-S1 interlaminar epidural steroid injection under fluoroscopic guidance. He is currently taking Xarelto  and we will need permission to come off of this medication prior to injection. I discussed injection procedure with him today, he has no questions at this time. I refilled short course of Tylenol  #3, however I encouraged him to follow up with pain clinic. I informed him that I would not be able to refill this medication long term. No red flag symptoms noted upon exam today.     Meds & Orders:  Meds ordered this encounter  Medications   acetaminophen -codeine  (TYLENOL  #3) 300-30 MG tablet    Sig: Take 1 tablet by mouth every 8 (eight) hours as needed for moderate pain (pain score 4-6) or severe pain (pain score 7-10).    Dispense:  20 tablet    Refill:  0    Orders Placed This Encounter  Procedures   Ambulatory referral to Physical Medicine Rehab    Follow-up: Return for L5-S1 interlaminar epidural steroid injection.   Procedures: No procedures performed      Clinical History: LINICAL DATA:  Low back pain radiating down both legs. Recent fall.   EXAM: CT LUMBAR SPINE WITHOUT CONTRAST   TECHNIQUE: Multidetector CT imaging of the lumbar  spine was performed without intravenous contrast administration. Multiplanar CT image reconstructions were also generated.   RADIATION DOSE REDUCTION: This exam was performed according to the departmental dose-optimization program which includes automated exposure control, adjustment of the mA and/or kV according to patient size and/or use of iterative reconstruction technique.   COMPARISON:  05/27/2023   FINDINGS: Segmentation: 5 lumbar type vertebrae.   Alignment: Normal.   Vertebrae: Vertebral body heights are maintained. There is mild spondylosis throughout the lumbar spine. Mild facet arthropathy. No acute fracture.   Paraspinal and other soft tissues: Negative.   Disc levels: T12-L1 level demonstrates minimal broad-based disc bulge.   L1-2 level demonstrates broad-based disc bulge. Vacuum disc phenomenon. Mild canal stenosis due to the disc disease, facet arthropathy minimal ligamentum flavum hypertrophy. Minimal bilateral neural foraminal narrowing left worse than right.   L2-3 level demonstrates moderate broad-based disc bulge. Vacuum disc phenomenon. Mild-to-moderate canal stenosis which is multifactorial. Moderate bilateral neural foraminal narrowing.   L3-4 level demonstrates mild broad-based disc bulge. Minimal left-sided neural foraminal narrowing. Borderline canal stenosis.   L4-5 level demonstrates mild broad-based disc bulge. Minimal spinal canal stenosis. Mild bilateral neural foraminal narrowing.   L5-S1 level demonstrates minimal broad-based disc bulge.   Minimal calcified plaque over the distal abdominal aorta.   IMPRESSION: 1. No acute fracture or subluxation of the lumbar spine. 2. 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 Mild multilevel canal stenosis and multilevel neural foraminal narrowing as described above. Consider MRI for better evaluation.     Electronically Signed   By: Toribio Agreste M.D.   On:  02/28/2024 14:37   He reports that he quit smoking about 9 years ago. His smoking use included cigarettes. He started smoking about 37 years ago. He has a 28 pack-year smoking history. He has never used smokeless tobacco.  Recent Labs    10/08/23 1152 12/15/23 1608 12/29/23 0143 03/18/24 0237 04/01/24 1625  HGBA1C 9.3* 8.5*  --   --  8.9*  LABURIC 16.0*  --  14.2* 15.2*  --     Objective:  VS:  HT:    WT:   BMI:     BP:   HR: bpm  TEMP: ( )  RESP:  Physical Exam Vitals and nursing note reviewed.  HENT:     Head: Normocephalic and atraumatic.     Right Ear: External ear normal.     Left Ear: External ear normal.     Nose: Nose normal.     Mouth/Throat:     Mouth: Mucous membranes are moist.  Eyes:     Extraocular Movements: Extraocular movements intact.  Cardiovascular:     Rate and Rhythm: Normal rate.     Pulses: Normal pulses.  Pulmonary:     Effort: Pulmonary effort is normal.  Abdominal:     General: Abdomen is flat. There is no distension.  Musculoskeletal:        General: Tenderness present.     Cervical back: Normal range of motion.     Comments: His exam today was difficult, likely  due to pain and decreased effort. Patient is slow to rise from seated position to standing. Good lumbar range of motion. Pain noted with lumbar extension. 5/5 strength noted with bilateral hip flexion, knee flexion/extension, ankle dorsiflexion/plantarflexion and EHL. No clonus noted bilaterally. No pain upon palpation of greater trochanters. No pain with internal/external rotation of bilateral hips. Sensation intact bilaterally. Negative slump test bilaterally. Ambulates without aid, gait steady.     Skin:    General: Skin is warm and dry.     Capillary Refill: Capillary refill takes less than 2 seconds.  Neurological:     General: No focal deficit present.     Mental Status: He is alert and oriented to person, place, and time.  Psychiatric:        Mood and Affect: Mood normal.         Behavior: Behavior normal.     Ortho Exam  Imaging: No results found.  Past Medical/Family/Surgical/Social History: Medications & Allergies reviewed per EMR, new medications updated. Patient Active Problem List   Diagnosis Date Noted   Acute respiratory failure with hypoxia (HCC) 12/20/2023   COPD with acute exacerbation (HCC) 12/19/2023   Acute exacerbation of CHF (congestive heart failure) (HCC) 12/19/2023   ARF (acute renal failure) 10/08/2023   Acute gout 10/08/2023   Anemia 10/08/2023   Acute renal failure (ARF) 10/08/2023   Hypotension 09/07/2023   Acute deep vein thrombosis (DVT) of left upper extremity (HCC) 04/24/2023   Cellulitis of perineum 04/02/2023   Heart failure with mildly reduced ejection fraction (HFmrEF, 41-49%) (HCC) 02/28/2023   Class 3 obesity (HCC) 02/28/2023   SOB (shortness of breath) 02/27/2023   Loud snoring 10/28/2022   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   Hyperlipidemia associated with type 2 diabetes mellitus (HCC) 09/18/2022   PTSD (post-traumatic stress disorder) 07/10/2022   Panic attack 06/17/2022   Passive suicidal ideations 06/17/2022   History of ventricular tachycardia 06/14/2022   Essential hypertension 06/14/2022   Stage 3b chronic kidney disease (HCC) 05/15/2022   Acute gout due to renal impairment involving left ankle 05/07/2022   Acute gout due to renal impairment involving right knee 05/07/2022   Hyponatremia 05/07/2022   Diarrhea 05/06/2022   Hyperglycemia 05/06/2022   Cellulitis of buttock 05/06/2022   Acute on chronic combined systolic and diastolic CHF (congestive heart failure) (HCC) 05/06/2022   Acute renal failure superimposed on stage 3b chronic kidney disease (HCC) 05/06/2022   Hypokalemia 05/05/2022   Rectal abscess 05/05/2022   Left buttock abscess 05/05/2022   Cardiac arrest (HCC) 05/03/2022   AKI (acute kidney injury) 05/03/2022   VT (ventricular tachycardia) (HCC) 05/03/2022   CKD (chronic  kidney disease) stage 2, GFR 60-89 ml/min 10/24/2019   Acute non-recurrent frontal sinusitis 06/27/2019   COVID-19 virus infection 06/27/2019   Glaucoma suspect 12/22/2018   Seasonal and perennial allergic rhinoconjunctivitis 10/25/2018   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   Urticaria 05/03/2018   S/P inguinal hernia repair 03/01/2018   Unilateral inguinal hernia without obstruction or gangrene 07/24/2017   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   Hypersomnia 02/28/2016   Abnormal PFT 02/24/2016   Dyspnea 01/28/2016   Diabetic polyneuropathy associated with type 2 diabetes mellitus (HCC) 12/18/2015   Lumbar back pain 12/18/2015   Type 2 diabetes mellitus with hyperlipidemia (HCC) 09/17/2015   Left median nerve neuropathy 04/16/2015   Lesion of right median nerve at forearm 04/16/2015   ICD (implantable cardioverter-defibrillator) in place 03/28/2014   Chronic systolic congestive heart  failure (HCC) 03/28/2014   Nonischemic cardiomyopathy (HCC) 11/09/2013   Syncope 10/20/2013   Chest pain 07/28/2013   RENAL CALCULUS 05/13/2009   Obesity 05/08/2009   Former heavy cigarette smoker (20-39 per day) 05/08/2009   HOARSENESS 05/08/2009   GAD (generalized anxiety disorder) 02/28/2008   ASTHMATIC BRONCHITIS, ACUTE 02/28/2008   GERD 02/28/2008   IRRITABLE BOWEL SYNDROME 02/28/2008   Past Medical History:  Diagnosis Date   AICD (automatic cardioverter/defibrillator) present    Chronic bronchitis (HCC)    get it most q yr (12/23/2013)   Chronic systolic (congestive) heart failure (HCC)    Fracture of left humerus    a. 07/2013.   GERD (gastroesophageal reflux disease)    not at present time   Heart murmur    born w/it    History of renal calculi    Hypertension    Kidney stones    NICM (nonischemic cardiomyopathy) (HCC)    a. 07/2013 Echo: EF 20-25%, diff HK, Gr2 DD, mild MR, mod dil LA/RA. 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    Shockable heart rhythm detected by automated external defibrillator    Syncope    Type II diabetes mellitus (HCC) 2006   Family History  Problem Relation Age of Onset   Diabetes Mother    Arthritis Mother    Hypertension Mother    Diabetes Father    Arthritis Father    Hypertension Father    Hypertension Brother    Mental illness Brother    Parkinson's disease Paternal Grandfather    Past Surgical History:  Procedure Laterality Date   CARDIAC CATHETERIZATION  08/10/2013   CHOLECYSTECTOMY  ~ 2010   IMPLANTABLE CARDIOVERTER DEFIBRILLATOR IMPLANT  12/23/2013   STJ Fortify ICD implanted by Dr Kelsie for cardiomyopathy and syncope   IMPLANTABLE CARDIOVERTER DEFIBRILLATOR IMPLANT N/A 12/23/2013   Procedure: IMPLANTABLE CARDIOVERTER DEFIBRILLATOR IMPLANT;  Surgeon: Lynwood JONETTA Kelsie, MD;  Location: [facility] CATH LAB;  Service: Cardiovascular;  Laterality: N/A;   INGUINAL HERNIA REPAIR Left 03/01/2018   Procedure: OPEN REPAIR OF LEFT INGUINAL HERNIA WITH MESH;  Surgeon: Tanda Locus, MD;  Location: [facility] ORS;  Service: General;  Laterality: Left;   LEAD REVISION/REPAIR N/A 05/15/2022   Procedure: LEAD REVISION/REPAIR;  Surgeon: [name] Ole DASEN, MD;  Location: [facility] INVASIVE CV LAB;  Service: Cardiovascular;  Laterality: N/A;   LEFT HEART CATHETERIZATION WITH CORONARY ANGIOGRAM N/A 08/10/2013   Procedure: LEFT HEART CATHETERIZATION WITH CORONARY ANGIOGRAM;  Surgeon: Candyce GORMAN Reek, MD;  Location: [facility] CATH LAB;  Service: Cardiovascular;  Laterality: N/A;   ORIF HUMERUS FRACTURE Left 08/12/2013   Procedure: OPEN REDUCTION INTERNAL FIXATION (ORIF) HUMERAL SHAFT FRACTURE;  Surgeon: Jerona LULLA Sage, MD;  Location: [facility] OR;  Service: Orthopedics;  Laterality: Left;  Open Reduction Internal Fixation Left Humerus   RIGHT/LEFT HEART CATH AND CORONARY ANGIOGRAPHY N/A 05/12/2022   Procedure: RIGHT/LEFT HEART CATH AND CORONARY ANGIOGRAPHY;  Surgeon:  Gardenia Led, DO;  Location: [facility] INVASIVE CV LAB;  Service: Cardiovascular;  Laterality: N/A;   URETEROSCOPY     laser for kidney stones   Social History   Occupational History   Occupation: Disabled  Tobacco Use   Smoking status: Former    Current packs/day: 0.00    Average packs/day: 1 pack/day for 28.0 years (28.0 ttl pk-yrs)    Types: Cigarettes    Start date: 06/03/1986    Quit date: 06/03/2014    Years  since quitting: 9.9   Smokeless tobacco: Never  Vaping Use   Vaping status: Never Used  Substance and Sexual Activity   Alcohol use: Not Currently   Drug use: Yes    Types: Marijuana    Comment: occasionally;   Sexual activity: Yes

## 2024-05-16 ENCOUNTER — Encounter: Admitting: Surgical
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
## 2024-05-18 ENCOUNTER — Other Ambulatory Visit: Payer: Self-pay

## 2024-05-18 ENCOUNTER — Ambulatory Visit: Payer: Medicaid Other | Attending: Cardiology

## 2024-05-18 ENCOUNTER — Other Ambulatory Visit: Payer: Self-pay | Admitting: *Deleted

## 2024-05-18 DIAGNOSIS — I5022 Chronic systolic (congestive) heart failure: Secondary | ICD-10-CM | POA: Diagnosis not present

## 2024-05-18 NOTE — Progress Notes (Signed)
 ADVANCED HEART FAILURE CLINIC NOTE  Primary Care: Vicci Barnie NOVAK, MD Primary Cardiologist: Dr. Pietro EP: Dr. [name] Carder: Dr. Meade Nephrology: Dr. Jerrye HF Cardiologist: Dr. Zenaida  HPI: [name] is a 58 y.o. male with HFrEF (dx 2015) w/ LHC demonstrating normal coronary anatomy s/p primary prevention ICD, OSA unable to tolerate CPAP, CKD IIIb & T2DM.   In 2017 he had a CPX with w/ moderate functional impairment, peak VO2 of 15, VE/VCO2 of 35. From a functional standpoint, [name] reports running 2-3 miles daily until he was diagnosed with 'long covid. Since that time he has seen a sharp decline in exercise capacity.   In 11/23, he was admitted for VT believed to be secondary to significant electrolyte derrangements from and underlying right gluteal abscess. Throughout his admission he had recurrent device shocks for VT. He underwent RHC/LHC that demonstrated normal coronary anatomy with RHC showing normal cardiac output and low filling pressures. He subsequently underwent placement of right atrial lead with improvement in ectopy after atrial pacing. TTE during admission with LVEF of 30-35%.   Echo 3/24 EF 20-25%, moderate LVH, RV okay.    Admitted 10/24 with groin cellulitis. Treated with IV antibiotics. He is now off Farxiga .   Readmitted 11/24 with extensive left upper extremity DVT. He was started on Eliquis . Echo 11/24: EF 25-30%.   Echo 3/25: EF up to 40-45%, RV okay   Seen in ED 3/25 with RSV infection. A week later given course of doxycycline  d/t persistent symptoms.  Admitted 5/25 with AKI 2/2 decrease oral intake. SCr peaked at 4.6 (blaseline 2.7). GDMT held and received IVF. He was restarted on Toprol  and torsemide , discharged home, weight 279 lbs.  Admitted 7/25 with a/c CHF, RLE DVT, gout, left-sided chest pain, left shoulder/back pain d/t osteoarthritis/possible gout. Diuresed and GDMT titrated. Chest pain felt to be most likely musculoskeletal in  origin. Echo during admit with LVEF 40-45%, moderate LVH, RV okay.  Admitted 9/25 after a syncopal episode d/t polymorphic VT/VF, terminated w/ ATP. K was 2.8 on arrival, in the setting of metolazone  use (not taking K). He was started on amiodarone . cMRI was attempted to r/o infiltrative process however pt unable to complete due to claustrophobia.    Admitted again 10/25 with chest pain. ECG w/o STE, trops negative. Cr was up to 3.4 on admission, he was given IVF and antibiotics due to concern for PNA seen on CT chest. Chest pain also attributed to gout in L shoulder (uric acid 15.2.) He was treated with pred and allopurinol  and discharged home.   Today he returns for HF follow up. Overall feeling fair. He has had dull chest pain that is waxing and waning x 2-3 weeks, non-exertional. Episodes last 5-30 minutes. He describes episodes as not painful, just dull. He has SOB with mild activity. Chronically sleeps with 2-6 pillows. Remains fatigued, unable to tolerate CPAP. Denies palpitations, abnormal bleeding, dizziness, edema, or PND. Appetite ok. Weight at home 273-274 pounds. Taking all medications.    Current Outpatient Medications  Medication Sig Dispense Refill   acetaminophen -codeine  (TYLENOL  #3) 300-30 MG tablet Take 1 tablet by mouth every 8 (eight) hours as needed for moderate pain (pain score 4-6) or severe pain (pain score 7-10). 20 tablet 0   allopurinol  (ZYLOPRIM ) 100 MG tablet Take 1 tablet (100 mg total) by mouth daily. 30 tablet 2   amiodarone  (PACERONE ) 200 MG tablet Take 200 mg by mouth daily.     atorvastatin  (LIPITOR) 10 MG  tablet Take 1 tablet (10 mg total) by mouth daily. 90 tablet 2   benzonatate  (TESSALON ) 200 MG capsule Take 200 mg by mouth 2 (two) times daily as needed for cough.     cetirizine  (ZYRTEC ) 10 MG tablet Take 1 tablet by mouth once daily 90 tablet 0   cholecalciferol  (VITAMIN D3) 25 MCG (1000 UNIT) tablet Take 1,000 Units by mouth in the morning.     clonazePAM   (KLONOPIN ) 0.5 MG tablet Take 1 tablet (0.5 mg total) by mouth 2 (two) times daily. for anxiety 60 tablet 1   Clotrimazole  1 % OINT Apply 1 Application topically 2 (two) times daily as needed (irritation).     colchicine  0.6 MG tablet Take 0.5 tablets (0.3 mg total) by mouth every Monday, Wednesday, and Friday.     Continuous Glucose Sensor (FREESTYLE LIBRE 2 PLUS SENSOR) MISC Change sensor every 15 days. 2 each 11   EPINEPHrine  0.3 mg/0.3 mL IJ SOAJ injection Inject 0.3 mg into the muscle as needed. 1 each 1   fluticasone  (FLONASE ) 50 MCG/ACT nasal spray Place 2 sprays into both nostrils daily. 16 g 6   gabapentin  (NEURONTIN ) 300 MG capsule Take 1 capsule (300 mg total) by mouth at bedtime. 90 capsule 1   HYDROcodone -acetaminophen  (NORCO/VICODIN) 5-325 MG tablet Take 1-2 tablets by mouth every 4 (four) hours as needed for moderate pain (pain score 4-6) or severe pain (pain score 7-10). 20 tablet 0   insulin  aspart (NOVOLOG  FLEXPEN) 100 UNIT/ML FlexPen Inject 22 Units into the skin 2 (two) times daily.     insulin  glargine (LANTUS  SOLOSTAR) 100 UNIT/ML Solostar Pen Inject 30 Units into the skin at bedtime.     Insulin  Pen Needle (TECHLITE PEN NEEDLES) 32G X 4 MM MISC Use to inject insulin  into the skin 3 times per day. 100 each 0   latanoprost (XALATAN) 0.005 % ophthalmic solution Place 1 drop into both eyes at bedtime.     magnesium  oxide (MAG-OX) 400 MG tablet Take 1 tablet (400 mg total) by mouth daily. 90 tablet 3   metolazone  (ZAROXOLYN ) 5 MG tablet Take 1 tablet (5 mg total) by mouth once a week. 12 tablet 2   nystatin  (MYCOSTATIN /NYSTOP ) powder Apply 1 Application topically 3 (three) times daily. 60 g 2   pantoprazole  (PROTONIX ) 40 MG tablet Take 1 tablet (40 mg total) by mouth daily. 90 tablet 1   potassium chloride  SA (KLOR-CON  M) 20 MEQ tablet Take 2 tablets (40 mEq total) by mouth 2 (two) times daily. Take an extra 20 mEq when you take Metolazone . 120 tablet 11   prednisoLONE  acetate  (PRED FORTE ) 1 % ophthalmic suspension Place 1 drop into the left eye at bedtime.     rivaroxaban  (XARELTO ) 20 MG TABS tablet Take 1 tablet (20 mg total) by mouth daily with supper. 30 tablet 2   sertraline  (ZOLOFT ) 50 MG tablet Take 1.5 tablets (75 mg total) by mouth daily. 45 tablet 2   SYMBICORT  160-4.5 MCG/ACT inhaler Inhale 2 puffs into the lungs in the morning and at bedtime.     tamsulosin  (FLOMAX ) 0.4 MG CAPS capsule Take 2 capsules (0.8 mg total) by mouth daily. 180 capsule 3   torsemide  (DEMADEX ) 20 MG tablet Take 3 tablets (60 mg total) by mouth daily. 180 tablet 3   No current facility-administered medications for this encounter.   Wt Readings from Last 3 Encounters:  05/23/24 124.9 kg (275 lb 6 oz)  04/25/24 126.6 kg (279 lb 3.2 oz)  04/01/24  124.7 kg (275 lb)   BP 118/76   Pulse 99   Wt 124.9 kg (275 lb 6 oz)   SpO2 95%   BMI 36.33 kg/m   Physical Exam  General:  NAD. No resp difficulty, walked into clinic, fatigued-appearing HEENT: Normal Neck: Supple. No JVD. Thick neck Cor: Regular rate & rhythm. No rubs, gallops or murmurs. Lungs: Diminished all lobes Abdomen: Soft, obese, nontender, nondistended.  Extremities: No cyanosis, clubbing, rash, edema Neuro: Alert & oriented x 3, moves all 4 extremities w/o difficulty. Affect pleasant.  DATA REVIEW  ECG: 10/25: NSR  85 bpm  05/23/24: NSR (personally reviewed)  ECHO: 10/25: EF 45-50%, mild LVH, nl RV 9/25: 45-50%, RV normal, severe LVH  7/25: EF 40-45%, moderate LVH, RV okay 3/25: EF 40-45%, moderate LVH, normal RV 11/24 LVEF 25%-30%, normal RV function.  3/24: LVEF 35%, normal RV function 11/23: LVEF 30-35%, normal RV function.  9/20: LVEF 45-50%, normal RV function.   CATH: 12/23 RHC: RA 1, PA 23/6 (12), PCW 4, Fick CO/CI 7/2.9, PVR ~1, PAPi >5 12/23 LHC: No obstructive CAD, left dominant w/ large Lcx supplying multiple large marginals.   ICD interrogation (personally reviewed): volume stable, no VT or  AF, 2.1 % VP  ASSESSMENT & PLAN:  Heart failure with reduced EF>>now HFmrEF - Nonischemic, by coronary angiography 12/23; no family history  - Chronic NYHA III, functional class confounded by obesity/deconditioning & COPD. Volume stable today. - GDMT limited by hypotension & CKD - Continue torsemide  60 mg bid. - Continue metolazone  5 mg weekly on Mondays - Off Entresto  and spiro with CKD - Off SGLT2i w/ groin cellulitis - Hold off adding back beta blocker with on-going fatigue (though this is likely multifactorial) - Labs today  VT - had episode 9/25 that terminated w/ ATP, in the setting of hypokalemia (2.8) - no further VT episodes on today's device interrogation  - Continue amiodarone  200 mg daily.  - Will need regular eye exam while on amio  DM II - Uncontrolled, last A1c 8.9 - Avoid SGLT2i with hx of groin infections - Per PCP  CKD IV  - Baseline SCr 2.5-2.7 - Avoid hypotension - followed by nephrology, due for follow up with Dr. Jerrye - Labs today   Hx DVT - Recurrent - Recently found to have RLE DVT while on Eliquis  - Now on Xarelto  20 mg daily.  - Followed by hematology  OSA - severe by sleep study - Unable to tolerate CPAP - He has been referred to ENT for possible Inspire device   COPD - Follows with Dr. Meade - On Symbicort  - Refer to Pulmonary Rehab, suspect contributing to his dyspnea  CP - atypical and non-exertional - suspect MSK vs pleuritc - No acute ECG changes  Follow up in 3-4 months with Dr. Zenaida.  Harlene HERO Lohman, FNP 05/23/2024

## 2024-05-18 NOTE — Patient Instructions (Signed)
 Visit Information  Mr. Glander was given information about Medicaid Managed Care team care coordination services as a part of their [facility] Medicaid benefit.   If you would like to schedule transportation through your [facility], please call the following number at least 2 days in advance of your appointment: [phone]   Rides for urgent appointments can also be made after hours by calling Member Services.  Call the Behavioral Health Crisis Line at [phone], at any time, 24 hours a day, 7 days a week. If you are in danger or need immediate medical attention call 911.  Please see education materials related to CHF and Diabetes provided by MyChart link.  Care plan and visit instructions communicated with the patient verbally today. Patient agrees to receive a copy in MyChart. Active MyChart status and patient understanding of how to access instructions and care plan via MyChart confirmed with patient.     Telephone follow up appointment with Managed Medicaid care management team member scheduled for: 06/30/24 @ 3:30 pm  Eliyas Suddreth, RN, BSN, ACM RN Care Manager [facility] [phone]   Following is a copy of your plan of care:   Goals Addressed             This Visit's Progress    VBCI RN Care Plan- CHF       Problems:  Chronic Disease Management support and education needs related to CHF  Goal: Over the next 90 days the Patient will attend all scheduled medical appointments: PCP and Specialist as evidenced by keeping all schedule appointments.         continue to work with Medical Illustrator and/or Social Worker to address care management and care coordination needs related to CHF as evidenced by adherence to care management team scheduled appointments      Interventions:   Heart Failure Interventions: Basic overview and discussion of pathophysiology of Heart Failure reviewed Provided education on low sodium  diet Assessed need for readable accurate scales in home Provided education about placing scale on hard, flat surface Advised patient to weigh each morning after emptying bladder Discussed importance of daily weight and advised patient to weigh and record daily Reviewed role of diuretics in prevention of fluid overload and management of heart failure; Discussed the importance of keeping all appointments with provider Provided patient with education about the role of exercise in the management of heart failure Screening for signs and symptoms of depression related to chronic disease state  Assessed social determinant of health barriers   Patient Self-Care Activities:  Attend all scheduled provider appointments Attend church or other social activities Call pharmacy for medication refills 3-7 days in advance of running out of medications Call provider office for new concerns or questions  Take medications as prescribed   call office if I gain more than 2 pounds in one day or 5 pounds in one week keep legs up while sitting use salt in moderation watch for swelling in feet, ankles and legs every day weigh myself daily bring diary to all appointments follow rescue plan if symptoms flare-up eat more whole grains, fruits and vegetables, lean meats and healthy fats track symptoms and what helps feel better or worse dress right for the weather, hot or cold  Plan:  Telephone follow up appointment with care management team member scheduled for:  06/30/24 @ 3:30 pm.          VBCI RN Care Plan-Diabetes  Problems:  Chronic Disease Management support and education needs related to DMII  Goal: Over the next 90 days the Patient will attend all scheduled medical appointments: PCP and Specialist as evidenced by keeping all schedule appointments.          continue to work with Medical Illustrator and/or Social Worker to address care management and care coordination needs related to DMII as  evidenced by adherence to care management team scheduled appointments     take all medications exactly as prescribed and will call provider for medication related questions as evidenced by compliance.    verbalize basic understanding of DMII disease process and self health management plan as evidenced by verbal explanation, recognize, monitor symptoms and life style changes.  Interventions:   Diabetes Interventions: Assessed patient's understanding of A1c goal: <7% Provided education to patient about basic DM disease process Reviewed medications with patient and discussed importance of medication adherence Counseled on importance of regular laboratory monitoring as prescribed Discussed plans with patient for ongoing care management follow up and provided patient with direct contact information for care management team Review of patient status, including review of consultants reports, relevant laboratory and other test results, and medications completed Screening for signs and symptoms of depression related to chronic disease state  Assessed social determinant of health barriers Lab Results  Component Value Date   HGBA1C 8.9 (A) 04/01/2024    Patient Self-Care Activities:  Attend all scheduled provider appointments Attend church or other social activities Call pharmacy for medication refills 3-7 days in advance of running out of medications Call provider office for new concerns or questions  Take medications as prescribed   schedule appointment with eye doctor check blood sugar at prescribed times: before meals and at bedtime and when you have symptoms of low or high blood sugar check feet daily for cuts, sores or redness enter blood sugar readings and medication or insulin  into daily log take the blood sugar log to all doctor visits eat fish at least once per week fill half of plate with vegetables limit fast food meals to no more than 1 per week manage portion size keep feet up  while sitting wash and dry feet carefully every day  Plan:  Telephone follow up appointment with care management team member scheduled for:  06/30/24 @ 3:30 pm.

## 2024-05-18 NOTE — Patient Outreach (Signed)
 Complex Care Management   Visit Note  05/18/2024  Name:  [name] MRN: [id number] DOB: 02-08-1966  Situation: Referral received for Complex Care Management related to Heart Failure and Diabetes I obtained verbal consent from Patient.  Visit completed with Patient  on the phone  Background:   Past Medical History:  Diagnosis Date   AICD (automatic cardioverter/defibrillator) present    Chronic bronchitis (HCC)    get it most q yr (12/23/2013)   Chronic systolic (congestive) heart failure (HCC)    Fracture of left humerus    a. 07/2013.   GERD (gastroesophageal reflux disease)    not at present time   Heart murmur    born w/it    History of renal calculi    Hypertension    Kidney stones    NICM (nonischemic cardiomyopathy) (HCC)    a. 07/2013 Echo: EF 20-25%, diff HK, Gr2 DD, mild MR, mod dil LA/RA. 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    Shockable heart rhythm detected by automated external defibrillator    Syncope    Type II diabetes mellitus (HCC) 2006    Assessment: Patient Reported Symptoms:  Cognitive Cognitive Status: No symptoms reported, Able to follow simple commands, Alert and oriented to person, place, and time, Insightful and able to interpret abstract concepts, Normal speech and language skills Cognitive/Intellectual Conditions Management [RPT]: None reported or documented in medical history or problem list   Health Maintenance Behaviors: Sleep adequate, Stress management Healing Pattern: Average Health Facilitated by: Stress management  Neurological Neurological Review of Symptoms: Headaches Neurological Comment: Patient reports he has had headaches for last 2 weeks off and on due to sinus.  Patient reports that he is having sinus pressure.  HEENT HEENT Symptoms Reported: No symptoms reported HEENT Management Strategies: Coping strategies, Routine screening HEENT  Self-Management Outcome: 4 (good)    Cardiovascular Cardiovascular Symptoms Reported: Chest pain or discomfort, Swelling in legs or feet Does patient have uncontrolled Hypertension?: No Cardiovascular Management Strategies: Adequate rest, Medication therapy, Routine screening Cardiovascular Self-Management Outcome: 3 (uncertain) Cardiovascular Comment: Patient that he has CHF and he reports that the chest pain due to CHF.  Patient reports that he checks his weight every other day.  He reports that his weight was 274 lbs. Patient reports that he is followed by Cardiology  Respiratory Respiratory Symptoms Reported: Dry cough, Shortness of breath, Wheezing Other Respiratory Symptoms: Patient reports that uses inhalers to help with shortness of breath.  Patient also reports that he has a CPAP but does not wear  the CPAP because he is not able to tolerate a mask on his face at night. Respiratory Management Strategies: Adequate rest, Asthma action plan, Weight management, Routine screening, Medication therapy Respiratory Self-Management Outcome: 3 (uncertain)  Endocrine Endocrine Symptoms Reported: No symptoms reported Is patient diabetic?: Yes Is patient checking blood sugars at home?: Yes List most recent blood sugar readings, include date and time of day: Patient reports that his blood sugar today at 222 and he after lunch his blood sugar was 162. Endocrine Self-Management Outcome: 3 (uncertain) Endocrine Comment: Highest-350 and lowest- 89.   Discussed with patient about carb counting  to help keeping blood sugar levels stable and helps with more flexiblity with food choices.  Gastrointestinal Gastrointestinal Symptoms Reported: No symptoms reported Gastrointestinal Management Strategies: Adequate rest Gastrointestinal Self-Management Outcome: 4 (good)    Genitourinary Genitourinary Symptoms Reported: No symptoms reported Genitourinary  Management Strategies: Adequate rest Genitourinary  Self-Management Outcome: 4 (good)  Integumentary Integumentary Symptoms Reported: Other Other Integumentary Symptoms: Patient reports that he had recent hospital admission for cellulitis to his groin area.  Patient reports that he was treated with IV antiobitics in the hospital.  He reports that he is using a powder in groin area to help with a foul odor that has been coming from groin area.  He is going to make provider aware.  He reports no open or redden areas in the groin area. Skin Management Strategies: Adequate rest, Medication therapy, Routine screening Skin Self-Management Outcome: 3 (uncertain)  Musculoskeletal Musculoskelatal Symptoms Reviewed: Back pain, Limited mobility, Muscle pain Additional Musculoskeletal Details: Patient reports that he has back pain and knee pain.  Patient informs me that pain level is 8/10 pain scale.  Patient reports he takes prescribed medications to assist with the pain. Patient uses a walker as needed to get aroudn his home. Musculoskeletal Management Strategies: Adequate rest, Coping strategies, Routine screening, Medication therapy, Medical device Musculoskeletal Self-Management Outcome: 3 (uncertain) Falls in the past year?: Yes (Patient reports that he has fainted twice and fall due to his heart condition.) Number of falls in past year: 2 or more Was there an injury with Fall?: Yes Fall Risk Category Calculator: 3 Patient Fall Risk Level: High Fall Risk Patient at Risk for Falls Due to: History of fall(s), Impaired balance/gait, Impaired mobility (Patient reports that last fall was 3 monts ago.) Fall risk Follow up: Falls evaluation completed, Education provided, Falls prevention discussed  Psychosocial Psychosocial Symptoms Reported: Anxiety - if selected complete GAD Additional Psychological Details: Patient reports he is active with [facility] for couseling and therapy Behavioral Management Strategies: Coping strategies, Counseling, Support system, Adequate  rest, Medication therapy Behavioral Health Self-Management Outcome: 4 (good) Major Change/Loss/Stressor/Fears (CP): Medical condition, self Techniques to Cope with Loss/Stress/Change: Counseling, Diversional activities Quality of Family Relationships: helpful, involved, supportive Do you feel physically threatened by others?: No    05/18/2024    PHQ2-9 Depression Screening   Little interest or pleasure in doing things Several days  Feeling down, depressed, or hopeless Several days  PHQ-2 - Total Score 2  Trouble falling or staying asleep, or sleeping too much Several days  Feeling tired or having little energy Several days  Poor appetite or overeating  Not at all  Feeling bad about yourself - or that you are a failure or have let yourself or your family down    Trouble concentrating on things, such as reading the newspaper or watching television Not at all  Moving or speaking so slowly that other people could have noticed.  Or the opposite - being so fidgety or restless that you have been moving around a lot more than usual Not at all  Thoughts that you would be better off dead, or hurting yourself in some way Not at all  PHQ2-9 Total Score 4  If you checked off any problems, how difficult have these problems made it for you to do your work, take care of things at home, or get along with other people Not difficult at all  Depression Interventions/Treatment      There were no vitals filed for this visit. Pain Scale: 0-10 Pain Score: 8  Pain Type: Chronic pain Pain Location: Back Pain Orientation: Lower Pain Descriptors / Indicators: Aching, Throbbing, Discomfort Pain Onset: On-going Pain Intervention(s): Medication (See eMAR), Rest  Medications Reviewed Today     Reviewed by Ileana Chalupa A, RN (Case Manager)  on 05/18/24 at 1352  Med List Status: <None>   Medication Order Taking? Sig Documenting Provider Last Dose Status Informant  acetaminophen -codeine  (TYLENOL  #3) 300-30 MG  tablet [id number] Yes Take 1 tablet by mouth every 8 (eight) hours as needed for moderate pain (pain score 4-6) or severe pain (pain score 7-10). Williams, Megan E, NP  Active   allopurinol  (ZYLOPRIM ) 100 MG tablet [id number] Yes Take 1 tablet (100 mg total) by mouth daily. Lee, Jordan, NP  Active   amiodarone  (PACERONE ) 200 MG tablet [id number] Yes Take 2 tablets (400 mg) twice daily for 2 weeks. Then on October 5, decrease to 1 tablet (200 mg) once daily. Mcarthur Pick, MD  Active Self, Pharmacy Records           Med Note ANNELL, JEANNINE CHRISTELLA Schaumann Mar 10, 2024  2:21 PM) Patient takes 1 tablet once Daily.  atorvastatin  (LIPITOR) 10 MG tablet [id number] Yes Take 1 tablet (10 mg total) by mouth daily. Vicci Barnie NOVAK, MD  Active Self, Pharmacy Records  benzonatate  (TESSALON ) 200 MG capsule [id number] Yes Take 200 mg by mouth 2 (two) times daily as needed for cough. [provider]  Active Self, Pharmacy Records  cetirizine  (ZYRTEC ) 10 MG tablet [id number] Yes Take 1 tablet by mouth once daily Vicci Barnie NOVAK, MD  Active   cholecalciferol  (VITAMIN D3) 25 MCG (1000 UNIT) tablet [id number] Yes Take 1,000 Units by mouth in the morning. [provider]  Active Self, Pharmacy Records  clonazePAM  (KLONOPIN ) 0.5 MG tablet [id number] Yes Take 1 tablet by mouth twice daily as needed for anxiety  Patient taking differently: Take 0.5 mg by mouth 2 (two) times daily. for anxiety   Carvin Arvella CHRISTELLA, MD  Active   Clotrimazole  1 % OINT [id number] Yes Apply 1 Application topically 2 (two) times daily as needed (irritation). Lelon Glendia DASEN, PA-C  Active Self, Pharmacy Records  colchicine  0.6 MG tablet [id number] Yes Take 0.5 tablets (0.3 mg total) by mouth every Monday, Wednesday, and Friday. Lelon Glendia DASEN, PA-C  Active Self, Pharmacy Records  Continuous Glucose Sensor (FREESTYLE LIBRE 2 PLUS SENSOR) [place] [id number] Yes Change sensor every 15 days. Vicci Barnie NOVAK, MD  Active Self, Pharmacy Records   EPINEPHrine  0.3 mg/0.3 mL IJ SOAJ injection [id number] Yes Inject 0.3 mg into the muscle as needed. Iva Marty Saltness, MD  Active Self, Pharmacy Records           Med Note DINO, NEVADA   Dju Dec 19, 2023  8:47 PM)    fluticasone  (FLONASE ) 50 MCG/ACT nasal spray [id number] Yes Place 2 sprays into both nostrils daily.  Patient taking differently: Place 2 sprays into both nostrils daily as needed for allergies or rhinitis.   Palmer Purchase, PA-C  Active Self, Pharmacy Records  gabapentin  (NEURONTIN ) 300 MG capsule [id number] Yes Take 1 capsule (300 mg total) by mouth at bedtime. Vicci Barnie NOVAK, MD  Active Self, Pharmacy Records  HYDROcodone -acetaminophen  (NORCO/VICODIN) 5-325 MG tablet [id number] Yes Take 1-2 tablets by mouth every 4 (four) hours as needed for moderate pain (pain score 4-6) or severe pain (pain score 7-10). Sigdel, Santosh, MD  Active Self, Pharmacy Records  insulin  aspart (NOVOLOG  FLEXPEN) 100 UNIT/ML FlexPen [id number] Yes Inject 22 Units into the skin 2 (two) times daily. Lelon Glendia T, PA-C  Active Self, Pharmacy Records  insulin  glargine (LANTUS  SOLOSTAR) 100 UNIT/ML Solostar Pen [id number] Yes Inject 30 Units into the skin at bedtime. Lelon Glendia DASEN, PA-C  Active Self, Pharmacy Records  Insulin  Pen Needle (TECHLITE PEN NEEDLES) 32G X 4 MM MISC [id number] Yes Use to inject insulin  into the skin 3 times per day. Regalado, Owen LABOR, MD  Active Self, Pharmacy Records  latanoprost (XALATAN) 0.005 % ophthalmic solution [id number] Yes Place 1 drop into both eyes at bedtime. [provider]  Active Self, Pharmacy Records  magnesium  oxide (MAG-OX) 400 MG tablet [id number] Yes Take 1 tablet (400 mg total) by mouth daily. Sigdel, Santosh, MD  Active Self, Pharmacy Records  metolazone  (ZAROXOLYN ) 5 MG tablet [id number] Yes Take 1 tablet (5 mg total) by mouth once a week. Vicci Barnie NOVAK, MD  Active Self, Pharmacy Records           Med Note JACKOLYN WADDELL VEAR [place] Feb 25, 2024 12:20 PM) On Mondays  nystatin  (MYCOSTATIN /NYSTOP ) powder [id number] Yes Apply 1 Application topically 3 (three) times daily. Vicci Barnie NOVAK, MD  Active Self, Pharmacy Records  pantoprazole  (PROTONIX ) 40 MG tablet [id number] Yes Take 1 tablet (40 mg total) by mouth daily. Vicci Barnie NOVAK, MD  Active Self, Pharmacy Records  potassium chloride  SA (KLOR-CON  M) 20 MEQ tablet [id number] Yes Take 2 tablets (40 mEq total) by mouth 2 (two) times daily. Take an extra 20 mEq when you take Metolazone . Sigdel, Santosh, MD  Active Self, Pharmacy Records  prednisoLONE  acetate (PRED FORTE ) 1 % ophthalmic suspension [id number] Yes Place 1 drop into the left eye at bedtime. [provider]  Active Self, Pharmacy Records  rivaroxaban  (XARELTO ) 20 MG TABS tablet [id number] Yes Take 1 tablet (20 mg total) by mouth daily with supper. Vicci Barnie NOVAK, MD  Active Self, Pharmacy Records  sertraline  (ZOLOFT ) 50 MG tablet [id number] Yes TAKE 1 & 1/2 (ONE & ONE-HALF) TABLETS BY MOUTH IN THE MORNING Carvin Arvella HERO, MD  Active   SYMBICORT  160-4.5 MCG/ACT inhaler [id number] Yes Inhale 2 puffs into the lungs in the morning and at bedtime. [provider]  Active Self, Pharmacy Records  tamsulosin  (FLOMAX ) 0.4 MG CAPS capsule [id number] Yes Take 2 capsules (0.8 mg total) by mouth daily. Vicci Barnie NOVAK, MD  Active Self, Pharmacy Records  torsemide  (DEMADEX ) 20 MG tablet [id number] Yes Take 3 tablets (60 mg total) by mouth daily. Lee, Jordan, NP  Active             Recommendation:   PCP Follow-up Specialty provider follow-up : [facility]-05/19/24; Cardiology-05/23/24/06/06/24; Orthopedics-06/13/24; Gastroenterologist; Endocrinologist-08/02/24 Continue Current Plan of Care  Follow Up Plan:   Telephone follow-up in 1 month: 06/30/24 @ 3:30 pm  Branndon Tuite, RN, BSN, [employer] RN Care Manager [employer] [phone]

## 2024-05-19 ENCOUNTER — Encounter (HOSPITAL_COMMUNITY): Payer: Self-pay | Admitting: Psychiatry

## 2024-05-19 ENCOUNTER — Telehealth (HOSPITAL_COMMUNITY): Admitting: Psychiatry

## 2024-05-19 DIAGNOSIS — F411 Generalized anxiety disorder: Secondary | ICD-10-CM | POA: Diagnosis not present

## 2024-05-19 DIAGNOSIS — F410 Panic disorder [episodic paroxysmal anxiety] without agoraphobia: Secondary | ICD-10-CM | POA: Diagnosis not present

## 2024-05-19 DIAGNOSIS — F4310 Post-traumatic stress disorder, unspecified: Secondary | ICD-10-CM

## 2024-05-19 LAB — CUP PACEART REMOTE DEVICE CHECK
Battery Remaining Longevity: 80 mo
Battery Remaining Percentage: 76 %
Battery Voltage: 3.01 V
Brady Statistic AP VP Percent: 1 %
Brady Statistic AP VS Percent: 17 %
Brady Statistic AS VP Percent: 1.7 %
Brady Statistic AS VS Percent: 81 %
Brady Statistic RA Percent Paced: 17 %
Brady Statistic RV Percent Paced: 1.8 %
Date Time Interrogation Session: 20251210020439
HighPow Impedance: 78 Ohm
Implantable Lead Connection Status: 753985
Implantable Lead Connection Status: 753985
Implantable Lead Implant Date: 20150717
Implantable Lead Implant Date: 20231207
Implantable Lead Location: 753859
Implantable Lead Location: 753860
Implantable Pulse Generator Implant Date: 20231207
Lead Channel Impedance Value: 350 Ohm
Lead Channel Impedance Value: 360 Ohm
Lead Channel Pacing Threshold Amplitude: 0.375 V
Lead Channel Pacing Threshold Amplitude: 1 V
Lead Channel Pacing Threshold Pulse Width: 0.5 ms
Lead Channel Pacing Threshold Pulse Width: 0.5 ms
Lead Channel Sensing Intrinsic Amplitude: 1.4 mV
Lead Channel Sensing Intrinsic Amplitude: 12 mV
Lead Channel Setting Pacing Amplitude: 1.375
Lead Channel Setting Pacing Amplitude: 2 V
Lead Channel Setting Pacing Pulse Width: 0.5 ms
Lead Channel Setting Sensing Sensitivity: 0.5 mV
Pulse Gen Serial Number: 810051574
Zone Setting Status: 755011

## 2024-05-19 MED ORDER — CLONAZEPAM 0.5 MG PO TABS: 0.5000 mg | ORAL_TABLET | Freq: Two times a day (BID) | ORAL | 1 refills | Status: AC

## 2024-05-19 MED ORDER — SERTRALINE HCL 50 MG PO TABS: 75.0000 mg | ORAL_TABLET | Freq: Every day | ORAL | 2 refills | Status: AC

## 2024-05-20 ENCOUNTER — Telehealth (HOSPITAL_COMMUNITY): Payer: Self-pay

## 2024-05-20 NOTE — Telephone Encounter (Signed)
 Called to confirm/remind patient of their appointment at [facility] on 05/23/24.   Appointment:   [x] Confirmed  [] Left mess   [] No answer/No voice mail  [] VM Full/unable to leave message  [] Phone not in service  Patient reminded to bring all medications and/or complete list.  Confirmed patient has transportation. Gave directions, instructed to utilize valet parking.

## 2024-05-23 ENCOUNTER — Ambulatory Visit (HOSPITAL_COMMUNITY)
Admission: RE | Admit: 2024-05-23 | Discharge: 2024-05-23 | Disposition: A | Source: Ambulatory Visit | Attending: Family Medicine

## 2024-05-23 ENCOUNTER — Encounter (HOSPITAL_COMMUNITY): Payer: Self-pay

## 2024-05-23 VITALS — BP 118/76 | HR 99 | Wt 275.4 lb

## 2024-05-23 DIAGNOSIS — E119 Type 2 diabetes mellitus without complications: Secondary | ICD-10-CM | POA: Diagnosis not present

## 2024-05-23 DIAGNOSIS — N184 Chronic kidney disease, stage 4 (severe): Secondary | ICD-10-CM | POA: Diagnosis not present

## 2024-05-23 DIAGNOSIS — I472 Ventricular tachycardia, unspecified: Secondary | ICD-10-CM | POA: Diagnosis not present

## 2024-05-23 DIAGNOSIS — I5020 Unspecified systolic (congestive) heart failure: Secondary | ICD-10-CM | POA: Diagnosis not present

## 2024-05-23 DIAGNOSIS — Z8616 Personal history of COVID-19: Secondary | ICD-10-CM | POA: Diagnosis not present

## 2024-05-23 DIAGNOSIS — J449 Chronic obstructive pulmonary disease, unspecified: Secondary | ICD-10-CM | POA: Diagnosis not present

## 2024-05-23 DIAGNOSIS — Z86718 Personal history of other venous thrombosis and embolism: Secondary | ICD-10-CM

## 2024-05-23 DIAGNOSIS — R0789 Other chest pain: Secondary | ICD-10-CM | POA: Diagnosis not present

## 2024-05-23 DIAGNOSIS — G4733 Obstructive sleep apnea (adult) (pediatric): Secondary | ICD-10-CM | POA: Diagnosis not present

## 2024-05-23 DIAGNOSIS — R079 Chest pain, unspecified: Secondary | ICD-10-CM | POA: Diagnosis not present

## 2024-05-23 DIAGNOSIS — M1000 Idiopathic gout, unspecified site: Secondary | ICD-10-CM | POA: Diagnosis not present

## 2024-05-23 DIAGNOSIS — R942 Abnormal results of pulmonary function studies: Secondary | ICD-10-CM | POA: Diagnosis not present

## 2024-05-23 DIAGNOSIS — Z7901 Long term (current) use of anticoagulants: Secondary | ICD-10-CM | POA: Diagnosis not present

## 2024-05-23 DIAGNOSIS — Z8679 Personal history of other diseases of the circulatory system: Secondary | ICD-10-CM | POA: Diagnosis not present

## 2024-05-23 DIAGNOSIS — Z6836 Body mass index (BMI) 36.0-36.9, adult: Secondary | ICD-10-CM | POA: Diagnosis not present

## 2024-05-23 DIAGNOSIS — Z794 Long term (current) use of insulin: Secondary | ICD-10-CM | POA: Diagnosis not present

## 2024-05-23 DIAGNOSIS — E1122 Type 2 diabetes mellitus with diabetic chronic kidney disease: Secondary | ICD-10-CM | POA: Diagnosis not present

## 2024-05-23 DIAGNOSIS — E669 Obesity, unspecified: Secondary | ICD-10-CM | POA: Diagnosis not present

## 2024-05-23 DIAGNOSIS — Z79899 Other long term (current) drug therapy: Secondary | ICD-10-CM | POA: Diagnosis not present

## 2024-05-23 LAB — BASIC METABOLIC PANEL WITH GFR
Anion gap: 11 (ref 5–15)
BUN: 36 mg/dL — ABNORMAL HIGH (ref 6–20)
CO2: 36 mmol/L — ABNORMAL HIGH (ref 22–32)
Calcium: 9.3 mg/dL (ref 8.9–10.3)
Chloride: 94 mmol/L — ABNORMAL LOW (ref 98–111)
Creatinine, Ser: 2.83 mg/dL — ABNORMAL HIGH (ref 0.61–1.24)
GFR, Estimated: 25 mL/min — ABNORMAL LOW (ref >60)
Glucose, Bld: 218 mg/dL — ABNORMAL HIGH (ref 70–99)
Potassium: 3.6 mmol/L (ref 3.5–5.1)
Sodium: 141 mmol/L (ref 135–145)

## 2024-05-23 NOTE — Patient Instructions (Signed)
 Medication Changes:  No Changes In Medications at this time.   Lab Work:  Labs done today, your results will be available in MyChart, we will contact you for abnormal readings.  Referrals:  YOU HAVE BEEN REFERRED TO PULMONARY REHAB THEY WILL REACH OUT TO YOU OR CALL TO ARRANGE THIS. PLEASE CALL US  WITH ANY CONCERNS   Special Instructions // Education:  PLEASE CALL AND MAKE FOLLOW UP WITH DR. MEADE [phone]  PLEASE CALL AND MAKE FOLLOW UP WITH DR. FOSTER [phone]  Follow-Up in: IN 3-4 MONTHS WITH DR. ZENAIDA PLEASE CALL OUR OFFICE AROUND FEB 2026 TO GET SCHEDULED FOR YOUR APPOINTMENT. PHONE NUMBER IS [phone] OPTION 2   At the [facility], you and your health needs are our priority. We have a designated team specialized in the treatment of Heart Failure. This Care Team includes your primary Heart Failure Specialized Cardiologist (physician), Advanced Practice Providers (APPs- Physician Assistants and Nurse Practitioners), and Pharmacist who all work together to provide you with the care you need, when you need it.   You may see any of the following providers on your designated Care Team at your next follow up:  Dr. Toribio Fuel Dr. Ezra Shuck Dr. Odis Zenaida Greig Mosses, NP Caffie Shed, [place] [facility] [place], [place] Beckey Coe, NP Jordan Lee, NP Tinnie Redman, PharmD   Please be sure to bring in all your medications bottles to every appointment.   Need to Contact Us :  If you have any questions or concerns before your next appointment please send us  a message through [place] or call our office at [phone].    TO LEAVE A MESSAGE FOR THE NURSE SELECT OPTION 2, PLEASE LEAVE A MESSAGE INCLUDING: YOUR NAME DATE OF BIRTH CALL BACK NUMBER REASON FOR CALL**this is important as we prioritize the call backs  YOU WILL RECEIVE A CALL BACK THE SAME DAY AS LONG AS YOU CALL BEFORE 4:00 PM

## 2024-05-24 ENCOUNTER — Encounter (HOSPITAL_COMMUNITY): Payer: Self-pay

## 2024-05-24 ENCOUNTER — Telehealth (HOSPITAL_COMMUNITY): Payer: Self-pay

## 2024-05-24 ENCOUNTER — Ambulatory Visit (HOSPITAL_COMMUNITY): Payer: Self-pay | Admitting: Family Medicine

## 2024-05-24 NOTE — Telephone Encounter (Signed)
 Patient returned call regarding pulmonary rehab, states he is unable to attend due to having back issues. Informed patient if he changes his mind he has up to a year to use his referral and can call us  back.  Closing referral.

## 2024-05-24 NOTE — Telephone Encounter (Signed)
 Called patient to see if he would be interested in Pulmonary rehab. LVMTCB.

## 2024-05-25 ENCOUNTER — Telehealth: Payer: Self-pay | Admitting: Cardiology

## 2024-05-25 ENCOUNTER — Telehealth: Payer: Self-pay

## 2024-05-25 NOTE — Telephone Encounter (Signed)
 Pt called in returning call stating that he missed the call he was on the phone with another nurse. He will like a call back

## 2024-05-25 NOTE — Telephone Encounter (Signed)
° °  Pt c/o of Chest Pain: STAT if active CP, including tightness, pressure, jaw pain, radiating pain to shoulder/upper arm/back, CP unrelieved by Nitro. Symptoms reported of SOB, nausea, vomiting, sweating.  1. Are you having CP right now? Yes     2. Are you experiencing any other symptoms (ex. SOB, nausea, vomiting, sweating)? Dizziness, syncope    3. Is your CP continuous or coming and going? Coming and going    4. Have you taken Nitroglycerin ? No    5. How long have you been experiencing CP? Since this morning     6. If NO CP at time of call then end call with telling Pt to call back or call 911 if Chest pain returns prior to return call from triage team.  Patient had an episode of CP this morning with lightheadedness, he states he saw blue like he may have passed out. Had chest pain again at time of call. Please advise.

## 2024-05-25 NOTE — Telephone Encounter (Signed)
Attempted return call to patient as requested by voice mail message and no answer.   

## 2024-05-25 NOTE — Telephone Encounter (Signed)
 Spoke with pt, aware everything with his device check was fine. He reports he is feeling fine now and will continue to monitor and let us  know if happens again. Pt agreed with this plan.

## 2024-05-25 NOTE — Telephone Encounter (Addendum)
 Transmission received.  Presenting rhythm AS/VS 68 bpm.   Device function WNL.  AP 15% VP 2.1%.  NO AT/AF episodes.  NO VT episodes.   Fluid level ok.  Symptoms noted do not appear to be device or arrhythmia related.

## 2024-05-25 NOTE — Telephone Encounter (Signed)
 Spoke with patient as he requested in voice mail message to cal him.  See ICM note today.  Device clinic triage has reviewed report as noted.

## 2024-05-25 NOTE — Telephone Encounter (Signed)
 Received incoming STAT call regarding CP and seeing Blue/passing out. Pt states he was laying down this morning and he began to see blue and pass out. Did not have CP this morning. Around 1:30 pm, he did have some Chest Pain that lasted about 5 seconds but did not see blue/pass out this time. No current BP information, but he states that normally his BP runs low/average. He is always SOB due to his COPD. He is taking all Cardiac meds as prescribed.  He wants to know if he is having any of the heart rhythm irregularities that he has from time to time. Advised pt to present to an ER or at least an Urgent Care center for further evaluation of his symptoms. Also gave him the Device Clinic number for pts [phone].   Will route to Device pool for interrogation.

## 2024-05-25 NOTE — Progress Notes (Signed)
 Remote ICD Transmission

## 2024-05-25 NOTE — Telephone Encounter (Signed)
 Spoke with patient.  He reported he saw blue lights this morning for a few seconds and felt like he was going to pass out but didn't.  Also a little while ago, he expereienced sharp pain in chest for a few seconds but resolved very quickly.  Advised I see a note he called Dr Vertie office earlier today.  Pt sent in a device report for review. Advised will alert device clinic triage to read report and send results to Dr Pietro.  Advised should he develop any further chest pain or changes in condition to call 911 and do not wait for a call back from the office and he verbalized understanding.

## 2024-05-27 ENCOUNTER — Other Ambulatory Visit: Payer: Self-pay | Admitting: Internal Medicine

## 2024-05-27 ENCOUNTER — Telehealth: Payer: Self-pay

## 2024-05-27 DIAGNOSIS — E1142 Type 2 diabetes mellitus with diabetic polyneuropathy: Secondary | ICD-10-CM

## 2024-05-27 NOTE — Telephone Encounter (Signed)
 Copied from CRM #[id number]. Topic: Clinical - Medication Question >> May 27, 2024  4:02 PM Antwanette L wrote: Reason for CRM: The patient called to request a refill of BD Pen Needle Short Ultrafine 31G x 8 mm (MISC). There is a pending order with a start date of 05/27/24. The patient was informed that refills can take up to 3 business days and advised to reach out to the pharmacy. The patient reports being currently out of needles.

## 2024-05-30 ENCOUNTER — Encounter: Payer: Self-pay | Admitting: Cardiology

## 2024-05-30 NOTE — Telephone Encounter (Signed)
 Called & spoke to the patient. Verified name & DOB. Informed that the requested refill was sent to his preferred pharmacy. Patient expressed verbal understanding of all discussed.

## 2024-05-30 NOTE — Telephone Encounter (Signed)
 Yes ma'am, rxn refill sent.

## 2024-05-31 ENCOUNTER — Other Ambulatory Visit: Payer: Self-pay | Admitting: Internal Medicine

## 2024-06-06 ENCOUNTER — Encounter: Payer: Self-pay | Admitting: Internal Medicine

## 2024-06-06 ENCOUNTER — Other Ambulatory Visit: Payer: Self-pay | Admitting: Internal Medicine

## 2024-06-06 ENCOUNTER — Ambulatory Visit: Attending: Cardiology

## 2024-06-06 DIAGNOSIS — I5022 Chronic systolic (congestive) heart failure: Secondary | ICD-10-CM

## 2024-06-06 DIAGNOSIS — Z95810 Presence of automatic (implantable) cardiac defibrillator: Secondary | ICD-10-CM | POA: Diagnosis not present

## 2024-06-06 MED ORDER — RELION PEN NEEDLES 32G X 4 MM MISC: 2 refills | Status: DC

## 2024-06-06 MED ORDER — INSULIN PEN NEEDLE 31G X 8 MM MISC: 11 refills | Status: DC

## 2024-06-06 MED ORDER — PEN NEEDLES 33G X 4 MM MISC: 2 refills | Status: DC

## 2024-06-06 NOTE — Progress Notes (Signed)
 EPIC Encounter for ICM Monitoring  Patient Name: [name] is a 58 y.o. male Date: 06/06/2024 Primary Care Physican: Vicci Barnie NOVAK, MD Primary Cardiologist: [facility] clinic Electrophysiologist: [facility] 08/24/2023 Office Weight: 295 lbs    09/28/2023 Weight: 291 lbs       10/13/2023  Weight: 280.2 lbs discharge weight 10/20/2023 Weight:  Not weighing at home    10/23/2023 Weight: Pt reports 5/27 he thinks he was 285 lbs at home 11/03/2023 Weight: 294 lbs (10 lb weight gain since 5/16) 12/15/2023 Office Weight: 289 lbs 01/02/2024 Hospital Weight: 285.5 lbs 01/13/2024 Weight: 277 lbs 02/02/2024 Weight: 280 lbs (was 274 lbs) 02/09/2024 Weight: 277 lbs 03/05/2024 Weight: 279 lbs (before 03/07/2024 Metolazone ) 04/13/2024 Weight: 268 -269 lbs 04/28/2024 Weight: 274 lbs 06/06/2024 Weight: 269 lbs   Spoke with patient and heart failure questions reviewed.  Transmission results reviewed.  Pt asymptomatic for fluid accumulation.  Reports feeling that started at top of head and traveled to his heart but can't describe it.  He did have dizziness when lying down but the episode resolved quickly.  No further complaints.    Since 05/02/2024 ICM Remote Transmission:  [place] thoracic impedance suggesting normal fluid levels with the exception of possible fluid accumulation starting 06/03/2024 (related to eating out over the holiday.).     Prescribed dosage:  Torsemide  20 mg take 3 tablet(s) (60 mg total) by mouth daily.  Potassium 20 mEq take 2 tablet(s) (40 mEq total) by mouth twice a day Metolazone  5 mg take 1 tablet by mouth once a week.  Pt takes on Mondays (Nephrologist said he could take it up to twice a week).  Take additional 20 mEq potassium when taking Metolazone .   Labs: 05/23/2024 Creatinine 2.83, BUN 36, Potassium 3.6, Sodium 141, GFR 25  04/29/2024 Creatinine 2.87, BUN 36, Potassium 3.4, Sodium 141, GFR 24  04/21/2024 Creatinine 3.15, BUN 30, Potassium 3.6, Sodium 140, GFR 22  03/28/2024  Creatinine 2.40, BUN 33, Potassium 4.6, Sodium 140, GFR 30 03/24/2024 Creatinine 2.51, BUN 59, Potassium 4.0, Sodium 136 03/23/2024 Creatinine 2.58, BUN 60, Potassium 3.3, Sodium 137, GFR 28  03/22/2024 Creatinine 2.64, BUN 62, Potassium 3.5, Sodium 135, GFR 27  03/21/2024 Creatinine 2.47, BUN 58, Potassium 3.7, Sodium 136, GFR 29  03/20/2024 Creatinine 2.61, BUN 54, Potassium 3.4, Sodium 137, GFR 27 03/19/2024 Creatinine 2.95, BUN 51, Potassium 3.7, Sodium 134, GFR 24  03/18/2024 Creatinine 3.48, BUN 53, Potassium 3.3, Sodium 137, GFR 19  03/16/2024 Creatinine 3.46, BUN 39, Potassium 3.7, Sodium 139, GFR 19  A complete set of results can be found in Results Review.   Recommendations:  Due to take weekly Metolazone  today which should resolve fluid accumulation.  No changes and encouraged to call if experiencing any fluid symptoms.   Follow-up plan: ICM clinic phone appointment on 06/15/2024 to recheck fluid levels.  Next 31 day ICM remote scheduled 07/11/2024.   91 day device clinic remote transmission 08/17/2024.     EP/Cardiology Office Visits:  Recall 09/20/2024 with Dr Zenaida.  Recall 09/24/2024 with Dr Inocencio.       Copy of ICM check sent to Dr Inocencio.      Remote monitoring is medically necessary for Heart Failure Management.    Daily Thoracic Impedance ICM trend: 03/08/2024 through 06/06/2024.    12-14 Month Thoracic Impedance ICM trend:     [name], RN 06/06/2024 9:57 AM

## 2024-06-06 NOTE — Addendum Note (Signed)
 Addended by: FLEETA MORRIS, GARNETTE L on: 06/06/2024 11:49 AM   Modules accepted: Orders

## 2024-06-06 NOTE — Telephone Encounter (Signed)
 I attempted to send ReliOn 4 mm, 32G pen needles to requested [facility] pharmacy. Even though I selected normal as the order class, it printed the rxn. I then sent generic 32G, 4mm needles instead and the rxn printed again. I'm not sure why it isn't allowing me to send e-scripts here. Routing to PCP so she can try.

## 2024-06-06 NOTE — Telephone Encounter (Signed)
 Copied from CRM [id number]. Topic: Clinical - Prescription Issue >> Jun 06, 2024 10:56 AM Darshell M wrote:  Reason for CRM: Pharmacy is unable to fill the approved prescription for Insulin  Pen Needle 31G X 8 MM Pharmacy requested provider submit  new prescription for Relion (4mm (5/32). Patient [phone]. Patient has one pen left.

## 2024-06-06 NOTE — Telephone Encounter (Signed)
 I sent the rxn. FYI, when message is sent by RN from answering service, you have to place order outside the sent message for rxn to go through.

## 2024-06-07 ENCOUNTER — Other Ambulatory Visit: Payer: Self-pay | Admitting: Pharmacist

## 2024-06-07 ENCOUNTER — Other Ambulatory Visit: Payer: Self-pay | Admitting: Internal Medicine

## 2024-06-07 ENCOUNTER — Ambulatory Visit: Payer: Self-pay | Admitting: Cardiology

## 2024-06-07 MED ORDER — PEN NEEDLES 32G X 4 MM MISC: 6 refills | Status: AC

## 2024-06-07 NOTE — Telephone Encounter (Signed)
 Copied from CRM [id number]. Topic: Clinical - Prescription Issue >> Jun 07, 2024 10:12 AM Everette C wrote:  Reason for CRM: Ronal with [facility] has called to share that the patien'ts manufacturer of pen needles no longer makes 31 x 8mm  Ronal would like to know if it's possible to change the patient's current prescription to 31 x 6mm   The patient has been made aware of the need to change. Please contact the pharmacy further if/when possible

## 2024-06-07 NOTE — Telephone Encounter (Signed)
 Noted! Thank you

## 2024-06-07 NOTE — Telephone Encounter (Signed)
 Please let pt know that rxn was sent to his pharmacy for the pen needles. They did not carry the size he wanted so we had to send for a different size.

## 2024-06-08 NOTE — Telephone Encounter (Signed)
 Called & spoke to the patient. Verified name & DOB. Informed that rxn was sent to his pharmacy for pen needles. Informed that the pharmacy no longer carries the preferred size so a rx for a different size was sent in. Patient expressed verbal understanding of all discussed.

## 2024-06-10 ENCOUNTER — Encounter: Payer: Self-pay | Admitting: Cardiology

## 2024-06-12 ENCOUNTER — Inpatient Hospital Stay (HOSPITAL_COMMUNITY)
Admission: EM | Admit: 2024-06-12 | Discharge: 2024-06-17 | DRG: 313 | Disposition: A | Discharge status: Home / Self Care | Attending: Internal Medicine | Admitting: Internal Medicine

## 2024-06-12 ENCOUNTER — Other Ambulatory Visit: Payer: Self-pay

## 2024-06-12 ENCOUNTER — Emergency Department (HOSPITAL_COMMUNITY)

## 2024-06-12 ENCOUNTER — Encounter (HOSPITAL_COMMUNITY): Payer: Self-pay

## 2024-06-12 DIAGNOSIS — Z8701 Personal history of pneumonia (recurrent): Secondary | ICD-10-CM

## 2024-06-12 DIAGNOSIS — G4733 Obstructive sleep apnea (adult) (pediatric): Secondary | ICD-10-CM | POA: Diagnosis present

## 2024-06-12 DIAGNOSIS — N1832 Chronic kidney disease, stage 3b: Secondary | ICD-10-CM | POA: Diagnosis present

## 2024-06-12 DIAGNOSIS — Z7951 Long term (current) use of inhaled steroids: Secondary | ICD-10-CM

## 2024-06-12 DIAGNOSIS — Z818 Family history of other mental and behavioral disorders: Secondary | ICD-10-CM

## 2024-06-12 DIAGNOSIS — E1142 Type 2 diabetes mellitus with diabetic polyneuropathy: Secondary | ICD-10-CM | POA: Diagnosis present

## 2024-06-12 DIAGNOSIS — I77810 Thoracic aortic ectasia: Secondary | ICD-10-CM | POA: Diagnosis present

## 2024-06-12 DIAGNOSIS — Z87442 Personal history of urinary calculi: Secondary | ICD-10-CM

## 2024-06-12 DIAGNOSIS — E1122 Type 2 diabetes mellitus with diabetic chronic kidney disease: Secondary | ICD-10-CM | POA: Diagnosis present

## 2024-06-12 DIAGNOSIS — M25511 Pain in right shoulder: Secondary | ICD-10-CM

## 2024-06-12 DIAGNOSIS — Z87891 Personal history of nicotine dependence: Secondary | ICD-10-CM

## 2024-06-12 DIAGNOSIS — N179 Acute kidney failure, unspecified: Secondary | ICD-10-CM | POA: Diagnosis not present

## 2024-06-12 DIAGNOSIS — Z86718 Personal history of other venous thrombosis and embolism: Secondary | ICD-10-CM

## 2024-06-12 DIAGNOSIS — G8929 Other chronic pain: Secondary | ICD-10-CM | POA: Diagnosis present

## 2024-06-12 DIAGNOSIS — I82622 Acute embolism and thrombosis of deep veins of left upper extremity: Secondary | ICD-10-CM | POA: Diagnosis present

## 2024-06-12 DIAGNOSIS — Z95810 Presence of automatic (implantable) cardiac defibrillator: Secondary | ICD-10-CM | POA: Diagnosis present

## 2024-06-12 DIAGNOSIS — J4489 Other specified chronic obstructive pulmonary disease: Secondary | ICD-10-CM | POA: Diagnosis present

## 2024-06-12 DIAGNOSIS — N138 Other obstructive and reflux uropathy: Secondary | ICD-10-CM | POA: Diagnosis present

## 2024-06-12 DIAGNOSIS — M25519 Pain in unspecified shoulder: Secondary | ICD-10-CM | POA: Diagnosis present

## 2024-06-12 DIAGNOSIS — Z8261 Family history of arthritis: Secondary | ICD-10-CM

## 2024-06-12 DIAGNOSIS — Z79899 Other long term (current) drug therapy: Secondary | ICD-10-CM

## 2024-06-12 DIAGNOSIS — N401 Enlarged prostate with lower urinary tract symptoms: Secondary | ICD-10-CM | POA: Diagnosis present

## 2024-06-12 DIAGNOSIS — K589 Irritable bowel syndrome without diarrhea: Secondary | ICD-10-CM | POA: Diagnosis present

## 2024-06-12 DIAGNOSIS — Z8679 Personal history of other diseases of the circulatory system: Secondary | ICD-10-CM

## 2024-06-12 DIAGNOSIS — Z91030 Bee allergy status: Secondary | ICD-10-CM

## 2024-06-12 DIAGNOSIS — R55 Syncope and collapse: Principal | ICD-10-CM

## 2024-06-12 DIAGNOSIS — Z9049 Acquired absence of other specified parts of digestive tract: Secondary | ICD-10-CM

## 2024-06-12 DIAGNOSIS — E872 Acidosis, unspecified: Secondary | ICD-10-CM | POA: Diagnosis present

## 2024-06-12 DIAGNOSIS — R34 Anuria and oliguria: Secondary | ICD-10-CM | POA: Diagnosis present

## 2024-06-12 DIAGNOSIS — R079 Chest pain, unspecified: Secondary | ICD-10-CM | POA: Diagnosis not present

## 2024-06-12 DIAGNOSIS — Z820 Family history of epilepsy and other diseases of the nervous system: Secondary | ICD-10-CM

## 2024-06-12 DIAGNOSIS — Z794 Long term (current) use of insulin: Secondary | ICD-10-CM

## 2024-06-12 DIAGNOSIS — E1169 Type 2 diabetes mellitus with other specified complication: Secondary | ICD-10-CM | POA: Diagnosis present

## 2024-06-12 DIAGNOSIS — I5022 Chronic systolic (congestive) heart failure: Secondary | ICD-10-CM | POA: Diagnosis present

## 2024-06-12 DIAGNOSIS — F419 Anxiety disorder, unspecified: Secondary | ICD-10-CM | POA: Diagnosis present

## 2024-06-12 DIAGNOSIS — M545 Low back pain, unspecified: Secondary | ICD-10-CM | POA: Diagnosis present

## 2024-06-12 DIAGNOSIS — Z8674 Personal history of sudden cardiac arrest: Secondary | ICD-10-CM

## 2024-06-12 DIAGNOSIS — Z8249 Family history of ischemic heart disease and other diseases of the circulatory system: Secondary | ICD-10-CM

## 2024-06-12 DIAGNOSIS — E7849 Other hyperlipidemia: Secondary | ICD-10-CM | POA: Diagnosis present

## 2024-06-12 DIAGNOSIS — R0789 Other chest pain: Principal | ICD-10-CM | POA: Diagnosis present

## 2024-06-12 DIAGNOSIS — I4729 Other ventricular tachycardia: Secondary | ICD-10-CM | POA: Diagnosis present

## 2024-06-12 DIAGNOSIS — I252 Old myocardial infarction: Secondary | ICD-10-CM

## 2024-06-12 DIAGNOSIS — M19011 Primary osteoarthritis, right shoulder: Secondary | ICD-10-CM | POA: Diagnosis present

## 2024-06-12 DIAGNOSIS — F32A Depression, unspecified: Secondary | ICD-10-CM | POA: Diagnosis present

## 2024-06-12 DIAGNOSIS — Z66 Do not resuscitate: Secondary | ICD-10-CM | POA: Diagnosis present

## 2024-06-12 DIAGNOSIS — I130 Hypertensive heart and chronic kidney disease with heart failure and stage 1 through stage 4 chronic kidney disease, or unspecified chronic kidney disease: Secondary | ICD-10-CM | POA: Diagnosis present

## 2024-06-12 DIAGNOSIS — Z6835 Body mass index (BMI) 35.0-35.9, adult: Secondary | ICD-10-CM

## 2024-06-12 DIAGNOSIS — K219 Gastro-esophageal reflux disease without esophagitis: Secondary | ICD-10-CM | POA: Diagnosis present

## 2024-06-12 DIAGNOSIS — Z7901 Long term (current) use of anticoagulants: Secondary | ICD-10-CM

## 2024-06-12 DIAGNOSIS — E669 Obesity, unspecified: Secondary | ICD-10-CM | POA: Diagnosis present

## 2024-06-12 DIAGNOSIS — I428 Other cardiomyopathies: Secondary | ICD-10-CM | POA: Diagnosis present

## 2024-06-12 DIAGNOSIS — Z833 Family history of diabetes mellitus: Secondary | ICD-10-CM

## 2024-06-12 LAB — CBC WITH DIFFERENTIAL/PLATELET
Abs Immature Granulocytes: 0.07 K/uL (ref 0.00–0.07)
Basophils Absolute: 0.1 K/uL (ref 0.0–0.1)
Basophils Relative: 1 %
Eosinophils Absolute: 0 K/uL (ref 0.0–0.5)
Eosinophils Relative: 0 %
HCT: 41.9 % (ref 39.0–52.0)
Hemoglobin: 14.2 g/dL (ref 13.0–17.0)
Immature Granulocytes: 1 %
Lymphocytes Relative: 17 %
Lymphs Abs: 1.7 K/uL (ref 0.7–4.0)
MCH: 30.3 pg (ref 26.0–34.0)
MCHC: 33.9 g/dL (ref 30.0–36.0)
MCV: 89.5 fL (ref 80.0–100.0)
Monocytes Absolute: 0.9 K/uL (ref 0.1–1.0)
Monocytes Relative: 9 %
Neutro Abs: 7.1 K/uL (ref 1.7–7.7)
Neutrophils Relative %: 72 %
Platelets: 281 K/uL (ref 150–400)
RBC: 4.68 MIL/uL (ref 4.22–5.81)
RDW: 14.8 % (ref 11.5–15.5)
WBC: 9.8 K/uL (ref 4.0–10.5)
nRBC: 0 % (ref 0.0–0.2)

## 2024-06-12 LAB — BASIC METABOLIC PANEL WITH GFR
Anion gap: 15 (ref 5–15)
BUN: 40 mg/dL — ABNORMAL HIGH (ref 6–20)
CO2: 30 mmol/L (ref 22–32)
Calcium: 9.8 mg/dL (ref 8.9–10.3)
Chloride: 93 mmol/L — ABNORMAL LOW (ref 98–111)
Creatinine, Ser: 3 mg/dL — ABNORMAL HIGH (ref 0.61–1.24)
GFR, Estimated: 23 mL/min — ABNORMAL LOW
Glucose, Bld: 251 mg/dL — ABNORMAL HIGH (ref 70–99)
Potassium: 3.5 mmol/L (ref 3.5–5.1)
Sodium: 138 mmol/L (ref 135–145)

## 2024-06-12 LAB — HEPATIC FUNCTION PANEL
ALT: 44 U/L (ref 0–44)
AST: 30 U/L (ref 15–41)
Albumin: 4.4 g/dL (ref 3.5–5.0)
Alkaline Phosphatase: 79 U/L (ref 38–126)
Bilirubin, Direct: 0.3 mg/dL — ABNORMAL HIGH (ref 0.0–0.2)
Indirect Bilirubin: 0.4 mg/dL (ref 0.3–0.9)
Total Bilirubin: 0.7 mg/dL (ref 0.0–1.2)
Total Protein: 8.1 g/dL (ref 6.5–8.1)

## 2024-06-12 LAB — LIPASE, BLOOD: Lipase: 27 U/L (ref 11–51)

## 2024-06-12 LAB — TROPONIN T, HIGH SENSITIVITY
Troponin T High Sensitivity: 31 ng/L — ABNORMAL HIGH (ref 0–19)
Troponin T High Sensitivity: 38 ng/L — ABNORMAL HIGH (ref 0–19)

## 2024-06-12 LAB — MAGNESIUM: Magnesium: 1.7 mg/dL (ref 1.7–2.4)

## 2024-06-12 LAB — PRO BRAIN NATRIURETIC PEPTIDE: Pro Brain Natriuretic Peptide: 226 pg/mL

## 2024-06-12 MED ORDER — MORPHINE SULFATE (PF) 4 MG/ML IV SOLN
4.0000 mg | Freq: Once | INTRAVENOUS | Status: AC
  Administered 2024-06-12: 4 mg via INTRAVENOUS
  Filled 2024-06-12: qty 1

## 2024-06-12 MED ORDER — OXYCODONE-ACETAMINOPHEN 5-325 MG PO TABS
1.0000 | ORAL_TABLET | Freq: Once | ORAL | Status: AC
  Administered 2024-06-12: 1 via ORAL
  Filled 2024-06-12: qty 1

## 2024-06-12 MED ORDER — PANTOPRAZOLE SODIUM 40 MG IV SOLR
40.0000 mg | Freq: Once | INTRAVENOUS | Status: AC
  Administered 2024-06-12: 40 mg via INTRAVENOUS
  Filled 2024-06-12: qty 10

## 2024-06-12 MED ORDER — MORPHINE SULFATE (PF) 4 MG/ML IV SOLN
4.0000 mg | Freq: Once | INTRAVENOUS | Status: DC
  Filled 2024-06-12: qty 1

## 2024-06-12 MED ORDER — ONDANSETRON HCL 4 MG/2ML IJ SOLN
4.0000 mg | Freq: Once | INTRAMUSCULAR | Status: AC
  Administered 2024-06-12: 4 mg via INTRAVENOUS
  Filled 2024-06-12: qty 2

## 2024-06-12 NOTE — Consult Note (Signed)
 "  Cardiology Consultation   Patient ID: [name] MRN: [id number]; DOB: 1966/03/02  Admit date: 06/12/2024 Date of Consult: 06/12/2024  PCP:  Vicci Barnie NOVAK, MD   [facility] Providers Cardiologist:  Redell Shallow, MD  Electrophysiologist:  OLE ONEIDA HOLTS, MD  Advanced Heart Failure:  Ria Commander, DO       Patient Profile: [name] is a 59 y.o. male with a hx of HFrEF (dx 2015) w/ LHC demonstrating normal coronary anatomy s/p ICD, prior ICD shocks for VT/VF, OSA unable to tolerate CPAP, CKD IIIb & T2DM  who is being seen 06/12/2024 for the evaluation of chest/shoulder pain at the request of [place].  History of Present Illness: [name] reports that 4 days ago he began having chest/shoulder pain symptoms similar to his last presentation in October when he was admitted with PNA. He describes pain at the R axilla that radiates down R arm to his elbow and pinpoint pain near his generator site which also radiates to his L neck. The episodes of pain are intermittently on the left side vs the right side. Pain lasts for seconds. Pleuritic, positional, and worse with palpation, at least when it happens on the right. He also reports seeing blue with slow motion vision, which is the prodrome he has experienced in the past prior to passing out although he has not passed out.  He denies any other symptoms.  In the ED vitals were stable.  Labs showing baseline creatinine and an elevated troponin 31-38.  Chest imaging largely unremarkable.   Past Medical History:  Diagnosis Date   AICD (automatic cardioverter/defibrillator) present    Chronic bronchitis (HCC)    get it most q yr (12/23/2013)   Chronic systolic (congestive) heart failure (HCC)    Fracture of left humerus    a. 07/2013.   GERD (gastroesophageal reflux disease)    not at present time   Heart murmur    born w/it    History of renal calculi    Hypertension    Kidney stones    NICM (nonischemic  cardiomyopathy) (HCC)    a. 07/2013 Echo: EF 20-25%, diff HK, Gr2 DD, mild MR, mod dil LA/RA. 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    Shockable heart rhythm detected by automated external defibrillator    Syncope    Type II diabetes mellitus (HCC) 2006    Past Surgical History:  Procedure Laterality Date   CARDIAC CATHETERIZATION  08/10/2013   CHOLECYSTECTOMY  ~ 2010   IMPLANTABLE CARDIOVERTER DEFIBRILLATOR IMPLANT  12/23/2013   STJ Fortify ICD implanted by Dr Kelsie for cardiomyopathy and syncope   IMPLANTABLE CARDIOVERTER DEFIBRILLATOR IMPLANT N/A 12/23/2013   Procedure: IMPLANTABLE CARDIOVERTER DEFIBRILLATOR IMPLANT;  Surgeon: Lynwood JONETTA Kelsie, MD;  Location: [facility] CATH LAB;  Service: Cardiovascular;  Laterality: N/A;   INGUINAL HERNIA REPAIR Left 03/01/2018   Procedure: OPEN REPAIR OF LEFT INGUINAL HERNIA WITH MESH;  Surgeon: Tanda Locus, MD;  Location: [facility] ORS;  Service: General;  Laterality: Left;   LEAD REVISION/REPAIR N/A 05/15/2022   Procedure: LEAD REVISION/REPAIR;  Surgeon: Holts Ole ONEIDA, MD;  Location: [facility] INVASIVE CV LAB;  Service: Cardiovascular;  Laterality: N/A;   LEFT HEART CATHETERIZATION WITH CORONARY ANGIOGRAM N/A 08/10/2013   Procedure: LEFT HEART CATHETERIZATION WITH CORONARY ANGIOGRAM;  Surgeon: Candyce GORMAN Reek, MD;  Location: [facility] CATH LAB;  Service: Cardiovascular;  Laterality: N/A;  ORIF HUMERUS FRACTURE Left 08/12/2013   Procedure: OPEN REDUCTION INTERNAL FIXATION (ORIF) HUMERAL SHAFT FRACTURE;  Surgeon: Jerona LULLA Sage, MD;  Location: [facility] OR;  Service: Orthopedics;  Laterality: Left;  Open Reduction Internal Fixation Left Humerus   RIGHT/LEFT HEART CATH AND CORONARY ANGIOGRAPHY N/A 05/12/2022   Procedure: RIGHT/LEFT HEART CATH AND CORONARY ANGIOGRAPHY;  Surgeon: Gardenia Led, DO;  Location: [facility] INVASIVE CV LAB;  Service: Cardiovascular;  Laterality: N/A;   URETEROSCOPY     laser  for kidney stones     Home Medications:  Prior to Admission medications  Medication Sig Start Date End Date Taking? Authorizing Provider  acetaminophen -codeine  (TYLENOL  #3) 300-30 MG tablet Take 1 tablet by mouth every 8 (eight) hours as needed for moderate pain (pain score 4-6) or severe pain (pain score 7-10). 05/12/24   Williams, Megan E, NP  allopurinol  (ZYLOPRIM ) 100 MG tablet Take 1 tablet (100 mg total) by mouth daily. 04/21/24   Lee, Jordan, NP  amiodarone  (PACERONE ) 200 MG tablet Take 200 mg by mouth daily.    [provider]  atorvastatin  (LIPITOR) 10 MG tablet Take 1 tablet (10 mg total) by mouth daily. 12/14/23   Vicci Barnie NOVAK, MD  benzonatate  (TESSALON ) 200 MG capsule Take 200 mg by mouth 2 (two) times daily as needed for cough. 11/01/23   [provider]  cetirizine  (ZYRTEC ) 10 MG tablet Take 1 tablet by mouth once daily 05/08/24   Vicci Barnie NOVAK, MD  cholecalciferol  (VITAMIN D3) 25 MCG (1000 UNIT) tablet Take 1,000 Units by mouth in the morning.    [provider]  clonazePAM  (KLONOPIN ) 0.5 MG tablet Take 1 tablet (0.5 mg total) by mouth 2 (two) times daily. for anxiety 05/19/24   Carvin Arvella HERO, MD  Clotrimazole  1 % OINT Apply 1 Application topically 2 (two) times daily as needed (irritation). 02/28/24   Lelon Hamilton T, PA-C  colchicine  0.6 MG tablet Take 0.5 tablets (0.3 mg total) by mouth every Monday, Wednesday, and Friday. 02/29/24   Lelon Hamilton T, PA-C  Continuous Glucose Sensor (FREESTYLE LIBRE 2 PLUS SENSOR) MISC Change sensor every 15 days. 02/17/24   Vicci Barnie NOVAK, MD  EPINEPHrine  0.3 mg/0.3 mL IJ SOAJ injection Inject 0.3 mg into the muscle as needed. 01/30/22   Iva Marty Saltness, MD  fluticasone  (FLONASE ) 50 MCG/ACT nasal spray Place 2 sprays into both nostrils daily. 11/16/23   Hedges, Reyes, PA-C  gabapentin  (NEURONTIN ) 300 MG capsule Take 1 capsule (300 mg total) by mouth at bedtime. 11/25/23   Vicci Barnie NOVAK, MD   HYDROcodone -acetaminophen  (NORCO/VICODIN) 5-325 MG tablet Take 1-2 tablets by mouth every 4 (four) hours as needed for moderate pain (pain score 4-6) or severe pain (pain score 7-10). 03/01/24   Mcarthur Pick, MD  insulin  aspart (NOVOLOG  FLEXPEN) 100 UNIT/ML FlexPen Inject 22 Units into the skin 2 (two) times daily. 02/28/24   Lelon Hamilton T, PA-C  insulin  glargine (LANTUS  SOLOSTAR) 100 UNIT/ML Solostar Pen Inject 30 Units into the skin at bedtime. 02/28/24   Lelon Hamilton T, PA-C  Insulin  Pen Needle (PEN NEEDLES) 32G X 4 MM MISC Use to inject insulin  3 times daily. 06/07/24   Vicci Barnie NOVAK, MD  latanoprost (XALATAN) 0.005 % ophthalmic solution Place 1 drop into both eyes at bedtime. 02/16/24   [provider]  magnesium  oxide (MAG-OX) 400 MG tablet Take 1 tablet (400 mg total) by mouth daily. 03/01/24   Mcarthur Pick, MD  metolazone  (ZAROXOLYN ) 5 MG tablet Take 1 tablet (5  mg total) by mouth once a week. 11/25/23 03/10/25  Vicci Barnie NOVAK, MD  nystatin  (MYCOSTATIN /NYSTOP ) powder Apply 1 Application topically 3 (three) times daily. 11/25/23   Vicci Barnie NOVAK, MD  pantoprazole  (PROTONIX ) 40 MG tablet Take 1 tablet (40 mg total) by mouth daily. 03/07/24   Vicci Barnie NOVAK, MD  potassium chloride  SA (KLOR-CON  M) 20 MEQ tablet Take 2 tablets (40 mEq total) by mouth 2 (two) times daily. Take an extra 20 mEq when you take Metolazone . 03/01/24   Mcarthur Pick, MD  prednisoLONE  acetate (PRED FORTE ) 1 % ophthalmic suspension Place 1 drop into the left eye at bedtime. 02/09/24   [provider]  rivaroxaban  (XARELTO ) 20 MG TABS tablet Take 1 tablet (20 mg total) by mouth daily with supper. 02/05/24   Vicci Barnie NOVAK, MD  sertraline  (ZOLOFT ) 50 MG tablet Take 1.5 tablets (75 mg total) by mouth daily. 05/19/24   Carvin Arvella HERO, MD  SYMBICORT  160-4.5 MCG/ACT inhaler Inhale 2 puffs into the lungs in the morning and at bedtime. 08/28/23   [provider]  tamsulosin  (FLOMAX ) 0.4  MG CAPS capsule Take 2 capsules (0.8 mg total) by mouth daily. 11/25/23   Vicci Barnie NOVAK, MD  torsemide  (DEMADEX ) 20 MG tablet Take 3 tablets (60 mg total) by mouth daily. 04/21/24   Lee, Jordan, NP    Scheduled Meds:   morphine  injection  4 mg Intravenous Once   Continuous Infusions:  PRN Meds:   Allergies:   Allergies[1]  Social History:   Social History   Socioeconomic History   Marital status: Single    Spouse name: Not on file   Number of children: Not on file   Years of education: Not on file   Highest education level: Not on file  Occupational History   Occupation: Disabled  Tobacco Use   Smoking status: Former    Current packs/day: 0.00    Average packs/day: 1 pack/day for 28.0 years (28.0 ttl pk-yrs)    Types: Cigarettes    Start date: 06/03/1986    Quit date: 06/03/2014    Years since quitting: 10.0   Smokeless tobacco: Never  Vaping Use   Vaping status: Never Used  Substance and Sexual Activity   Alcohol use: Not Currently   Drug use: Yes    Types: Marijuana    Comment: occasionally;   Sexual activity: Yes  Other Topics Concern   Not on file  Social History Narrative   Pt lives with his brother    Social Drivers of Health   Tobacco Use: Medium Risk (06/12/2024)   Patient History    Smoking Tobacco Use: Former    Smokeless Tobacco Use: Never    Passive Exposure: Not on file  Financial Resource Strain: Low Risk (05/25/2023)   Overall Financial Resource Strain (CARDIA)    Difficulty of Paying Living Expenses: Not hard at all  Recent Concern: Financial Resource Strain - High Risk (03/24/2023)   Overall Financial Resource Strain (CARDIA)    Difficulty of Paying Living Expenses: Hard  Food Insecurity: No Food Insecurity (05/18/2024)   Epic    Worried About Programme Researcher, Broadcasting/film/video in the Last Year: Never true    Ran Out of Food in the Last Year: Never true  Transportation Needs: No Transportation Needs (05/18/2024)   Epic    Lack of Transportation  (Medical): No    Lack of Transportation (Non-Medical): No  Physical Activity: Inactive (05/25/2023)   Exercise Vital Sign    Days of  Exercise per Week: 0 days    Minutes of Exercise per Session: 0 min  Stress: Stress Concern Present (05/25/2023)   [employer] of Occupational Health - Occupational Stress Questionnaire    Feeling of Stress : Rather much  Social Connections: Moderately Isolated (03/17/2024)   Social Connection and Isolation Panel    Frequency of Communication with Friends and Family: More than three times a week    Frequency of Social Gatherings with Friends and Family: More than three times a week    Attends Religious Services: More than 4 times per year    Active Member of Clubs or Organizations: No    Attends Banker Meetings: Never    Marital Status: Never married  Intimate Partner Violence: Not At Risk (05/18/2024)   Epic    Fear of Current or Ex-Partner: No    Emotionally Abused: No    Physically Abused: No    Sexually Abused: No  Depression (PHQ2-9): Low Risk (05/18/2024)   Depression (PHQ2-9)    PHQ-2 Score: 4  Alcohol Screen: Low Risk (05/25/2023)   Alcohol Screen    Last Alcohol Screening Score (AUDIT): 0  Housing: Low Risk (05/18/2024)   Epic    Unable to Pay for Housing in the Last Year: No    Number of Times Moved in the Last Year: 0    Homeless in the Last Year: No  Utilities: At Risk (05/18/2024)   Epic    Threatened with loss of utilities: Yes  Health Literacy: Adequate Health Literacy (05/25/2023)   B1300 Health Literacy    Frequency of need for help with medical instructions: Never    Family History:    Family History  Problem Relation Age of Onset   Diabetes Mother    Arthritis Mother    Hypertension Mother    Diabetes Father    Arthritis Father    Hypertension Father    Hypertension Brother    Mental illness Brother    Parkinson's disease Paternal Grandfather      ROS:  Please see the history of present  illness.   All other ROS reviewed and negative.     Physical Exam/Data: Vitals:   06/12/24 2100 06/12/24 2130 06/12/24 2239 06/12/24 2311  BP: (!) 136/100 117/83  120/79  Pulse: 83 82 92 95  Resp:    20  Temp:    98.4 F (36.9 C)  TempSrc:      SpO2: 91% 91% 90% (!) 87%  Weight:      Height:       No intake or output data in the 24 hours ending 06/12/24 2348    06/12/2024    1:09 PM 05/23/2024    3:56 PM 04/25/2024    1:08 PM  Last 3 Weights  Weight (lbs) 269 lb 275 lb 6 oz 279 lb 3.2 oz  Weight (kg) 122.018 kg 124.909 kg 126.644 kg     Body mass index is 35.49 kg/m.  General:  Well nourished, well developed, in no acute distress HEENT: normal Neck: no JVD Vascular: No carotid bruits; Distal pulses 2+ bilaterally Cardiac:  normal S1, S2; RRR; no murmur  Lungs:  clear to auscultation bilaterally, no wheezing, rhonchi or rales  Abd: soft, nontender, no hepatomegaly  Ext: no edema Musculoskeletal:  difficulty moving right arm Skin: warm and dry  Neuro:  CNs 2-12 intact, difficulty moving right arm Psych:  Normal affect   EKG:  The EKG was personally reviewed and demonstrates:  SR no  acute ischemia Telemetry:  Telemetry was personally reviewed and demonstrates:  SR  Relevant CV Studies: See above  Laboratory Data: High Sensitivity Troponin:  No results for input(s): TROPONINIHS in the last 720 hours.  Recent Labs  Lab 06/12/24 1500 06/12/24 1556  TRNPT 31* 38*      Chemistry Recent Labs  Lab 06/12/24 1311 06/12/24 1556  NA  --  138  K  --  3.5  CL  --  93*  CO2  --  30  GLUCOSE  --  251*  BUN  --  40*  CREATININE  --  3.00*  CALCIUM   --  9.8  MG 1.7  --   GFRNONAA  --  23*  ANIONGAP  --  15    Recent Labs  Lab 06/12/24 1556  PROT 8.1  ALBUMIN  4.4  AST 30  ALT 44  ALKPHOS 79  BILITOT 0.7   Lipids No results for input(s): CHOL, TRIG, HDL, LABVLDL, LDLCALC, CHOLHDL in the last 168 hours.  Hematology Recent Labs  Lab  06/12/24 1311  WBC 9.8  RBC 4.68  HGB 14.2  HCT 41.9  MCV 89.5  MCH 30.3  MCHC 33.9  RDW 14.8  PLT 281   Thyroid  No results for input(s): TSH, FREET4 in the last 168 hours.  BNP Recent Labs  Lab 06/12/24 1311  PROBNP 226.0    DDimer No results for input(s): DDIMER in the last 168 hours.  Radiology/Studies:  CT Chest Wo Contrast Result Date: 06/12/2024 EXAM: CT CHEST WITHOUT CONTRAST 06/12/2024 06:45:03 PM TECHNIQUE: CT of the chest was performed without the administration of intravenous contrast. Multiplanar reformatted images are provided for review. Automated exposure control, iterative reconstruction, and/or weight based adjustment of the mA/kV was utilized to reduce the radiation dose to as low as reasonably achievable. COMPARISON: 03/16/2024. CLINICAL HISTORY: chest pain, shortness of breath, radiating to L back and R shoulder. FINDINGS: MEDIASTINUM: Mild cardiomegaly. Pacer wires in the right heart. The central airways are clear. LYMPH NODES: No mediastinal, hilar or axillary lymphadenopathy. LUNGS AND PLEURA: Atelectasis in the left lower lobe adjacent to the heart. No pulmonary edema. No pleural effusion or pneumothorax. SOFT TISSUES/BONES: No acute abnormality of the bones or soft tissues. UPPER ABDOMEN: Limited images of the upper abdomen demonstrates no acute abnormality. IMPRESSION: 1. No acute findings. 2. Left lower lobe atelectasis adjacent to the heart. 3. Mild cardiomegaly. Electronically signed by: Franky Crease MD 06/12/2024 07:03 PM EST RP Workstation: [id number]   DG Chest Portable 1 View Result Date: 06/12/2024 CLINICAL DATA:  Chest pain EXAM: PORTABLE CHEST 1 VIEW COMPARISON:  March 16, 2024 FINDINGS: Evaluation is limited by positioning. The cardiomediastinal silhouette is unchanged and enlarged in contour.LEFT chest AICD. No pleural effusion. No pneumothorax. LEFT retrocardiac opacity, likely atelectasis. Perihilar vascular fullness without overt edema.  IMPRESSION: Perihilar vascular fullness without overt edema. Electronically Signed   By: Corean Salter M.D.   On: 06/12/2024 13:47     Assessment and Plan: Non cardiac chest pain Patient is reporting noncardiac chest pain which is intermittently in his right versus left chest lasting for several seconds and worse with palpation and pleuritic.  Currently he is pain-free.  He reports prodrome similar to when he has passed out in the past but without any loss of consciousness.  He denies any other concerning symptoms.  Troponin is slightly elevated which likely represents nonischemic myocardial injury in the setting of his significant chronic kidney disease.  Given prodrome noted above in history of  ventricular arrhythmia would recommend automatic device interrogation to ensure no recent VT/VF.  ED team wants to admit for observation overnight which I think is reasonable.      For questions or updates, please contact [facility] Please consult [email or web address] for contact info under      Signed, Dorn CHRISTELLA Flemings, MD  06/12/2024 11:48 PM     [1]  Allergies Allergen Reactions   Bee Venom Anaphylaxis   Trulicity  [Dulaglutide ] Anaphylaxis and Diarrhea    Extra dose of Trulicity  caused cardiac arrest. Patient experience onset of diarrhea with using this medication - cleared after med stopped.   "

## 2024-06-12 NOTE — ED Notes (Signed)
 Unable to get blood for redraw.

## 2024-06-12 NOTE — ED Triage Notes (Signed)
 Patient bib EMS from home with chest pain for the past 4 days left side to back of neck down right arm. He has pain on ROM of right arm. He also has shob on exertion. He has a Corporate Treasurer. Multiple Cardiac arrests due to adverse medicine reactions. A&Ox4 on arrival to ED.

## 2024-06-12 NOTE — ED Notes (Signed)
 Patient transported to CT

## 2024-06-12 NOTE — ED Provider Notes (Signed)
 " [facility] Provider Note   CSN: [id number] Arrival date & time: 06/12/24  1251     Patient presents with: No chief complaint on file.   [name] is a 59 y.o. male. HPI Pt is a 59 y/o male presenting to the ED with concerns left-sided chest pain radiatingto right lower described as sharp additionally having shortness of breath with right shoulder pain that is tender to touch.  No known injury.  Reports that he feels similar with previous MI's.  Reports symptoms are worse on exertion.  Has been faithful with medications with no reported missed doses otherwise including his Xarelto  however noted that he has not taken his medications today.  Previous medical history of Obesity, heavy smoker, GERD, cardiomyopathy, ICD in place, CHF, diabetes, polyneuropathy, IBS, with acidosis, CKD On Xarelto , with last dose yesterday, no reported missed doses.   Denies fever, headaches, vertigo, abdominal pain, vomiting, diarrhea, melena, hematochezia, dysuria, hematuria, rashes, lower leg swelling.    Prior to Admission medications  Medication Sig Start Date End Date Taking? Authorizing Provider  acetaminophen -codeine  (TYLENOL  #3) 300-30 MG tablet Take 1 tablet by mouth every 8 (eight) hours as needed for moderate pain (pain score 4-6) or severe pain (pain score 7-10). 05/12/24   Williams, Megan E, NP  allopurinol  (ZYLOPRIM ) 100 MG tablet Take 1 tablet (100 mg total) by mouth daily. 04/21/24   Lee, Jordan, NP  amiodarone  (PACERONE ) 200 MG tablet Take 200 mg by mouth daily.    [provider]  atorvastatin  (LIPITOR) 10 MG tablet Take 1 tablet (10 mg total) by mouth daily. 12/14/23   Vicci Barnie NOVAK, MD  benzonatate  (TESSALON ) 200 MG capsule Take 200 mg by mouth 2 (two) times daily as needed for cough. 11/01/23   [provider]  cetirizine  (ZYRTEC ) 10 MG tablet Take 1 tablet by mouth once daily 05/08/24   Vicci Barnie NOVAK, MD   cholecalciferol  (VITAMIN D3) 25 MCG (1000 UNIT) tablet Take 1,000 Units by mouth in the morning.    [provider]  clonazePAM  (KLONOPIN ) 0.5 MG tablet Take 1 tablet (0.5 mg total) by mouth 2 (two) times daily. for anxiety 05/19/24   Carvin Arvella HERO, MD  Clotrimazole  1 % OINT Apply 1 Application topically 2 (two) times daily as needed (irritation). 02/28/24   Lelon Hamilton T, PA-C  colchicine  0.6 MG tablet Take 0.5 tablets (0.3 mg total) by mouth every Monday, Wednesday, and Friday. 02/29/24   Lelon Hamilton T, PA-C  Continuous Glucose Sensor (FREESTYLE LIBRE 2 PLUS SENSOR) MISC Change sensor every 15 days. 02/17/24   Vicci Barnie NOVAK, MD  EPINEPHrine  0.3 mg/0.3 mL IJ SOAJ injection Inject 0.3 mg into the muscle as needed. 01/30/22   Iva Marty Saltness, MD  fluticasone  (FLONASE ) 50 MCG/ACT nasal spray Place 2 sprays into both nostrils daily. 11/16/23   Hedges, Reyes, PA-C  gabapentin  (NEURONTIN ) 300 MG capsule Take 1 capsule (300 mg total) by mouth at bedtime. 11/25/23   Vicci Barnie NOVAK, MD  HYDROcodone -acetaminophen  (NORCO/VICODIN) 5-325 MG tablet Take 1-2 tablets by mouth every 4 (four) hours as needed for moderate pain (pain score 4-6) or severe pain (pain score 7-10). 03/01/24   Mcarthur Pick, MD  insulin  aspart (NOVOLOG  FLEXPEN) 100 UNIT/ML FlexPen Inject 22 Units into the skin 2 (two) times daily. 02/28/24   Lelon Hamilton T, PA-C  insulin  glargine (LANTUS  SOLOSTAR) 100 UNIT/ML Solostar Pen Inject 30 Units into the skin at bedtime. 02/28/24  Lelon, Scott T, PA-C  Insulin  Pen Needle (PEN NEEDLES) 32G X 4 MM MISC Use to inject insulin  3 times daily. 06/07/24   Vicci Barnie NOVAK, MD  latanoprost (XALATAN) 0.005 % ophthalmic solution Place 1 drop into both eyes at bedtime. 02/16/24   [provider]  magnesium  oxide (MAG-OX) 400 MG tablet Take 1 tablet (400 mg total) by mouth daily. 03/01/24   Sigdel, Santosh, MD  metolazone  (ZAROXOLYN ) 5 MG tablet Take 1 tablet (5 mg total) by  mouth once a week. 11/25/23 03/10/25  Vicci Barnie NOVAK, MD  nystatin  (MYCOSTATIN /NYSTOP ) powder Apply 1 Application topically 3 (three) times daily. 11/25/23   Vicci Barnie NOVAK, MD  pantoprazole  (PROTONIX ) 40 MG tablet Take 1 tablet (40 mg total) by mouth daily. 03/07/24   Vicci Barnie NOVAK, MD  potassium chloride  SA (KLOR-CON  M) 20 MEQ tablet Take 2 tablets (40 mEq total) by mouth 2 (two) times daily. Take an extra 20 mEq when you take Metolazone . 03/01/24   Mcarthur Pick, MD  prednisoLONE  acetate (PRED FORTE ) 1 % ophthalmic suspension Place 1 drop into the left eye at bedtime. 02/09/24   [provider]  rivaroxaban  (XARELTO ) 20 MG TABS tablet Take 1 tablet (20 mg total) by mouth daily with supper. 02/05/24   Vicci Barnie NOVAK, MD  sertraline  (ZOLOFT ) 50 MG tablet Take 1.5 tablets (75 mg total) by mouth daily. 05/19/24   Carvin Arvella HERO, MD  SYMBICORT  160-4.5 MCG/ACT inhaler Inhale 2 puffs into the lungs in the morning and at bedtime. 08/28/23   [provider]  tamsulosin  (FLOMAX ) 0.4 MG CAPS capsule Take 2 capsules (0.8 mg total) by mouth daily. 11/25/23   Vicci Barnie NOVAK, MD  torsemide  (DEMADEX ) 20 MG tablet Take 3 tablets (60 mg total) by mouth daily. 04/21/24   Lee, Jordan, NP    Allergies: Bee venom and Trulicity  [email or web address].[email or web address] ]    Review of Systems  Respiratory:  Positive for shortness of breath.   Cardiovascular:  Positive for chest pain.  Musculoskeletal:  Positive for arthralgias, back pain and myalgias.  All other systems reviewed and are negative.   Updated Vital Signs BP 120/79   Pulse 95   Temp 98.4 F (36.9 C)   Resp 20   Ht 6' 1 (1.854 m)   Wt 122 kg   SpO2 (!) 87%   BMI 35.49 kg/m   Physical Exam Vitals and nursing note reviewed.  Constitutional:      General: He is not in acute distress.    Appearance: Normal appearance. He is not ill-appearing or diaphoretic.  HENT:     Head: Normocephalic and atraumatic.  Eyes:     General: No scleral  icterus.       Right eye: No discharge.        Left eye: No discharge.     Extraocular Movements: Extraocular movements intact.     Conjunctiva/sclera: Conjunctivae normal.  Cardiovascular:     Rate and Rhythm: Normal rate and regular rhythm.     Pulses: Normal pulses.     Heart sounds: Normal heart sounds. No murmur heard.    No friction rub. No gallop.  Pulmonary:     Effort: Pulmonary effort is normal. No respiratory distress.     Breath sounds: No stridor. No wheezing, rhonchi or rales.  Chest:     Chest wall: Tenderness present.  Abdominal:     General: Abdomen is flat. There is no distension.     Palpations: Abdomen is soft.  Tenderness: There is no abdominal tenderness. There is no right CVA tenderness, left CVA tenderness, guarding or rebound.  Musculoskeletal:        General: Tenderness (Right shoulder, left chest.) present. No swelling, deformity or signs of injury.     Cervical back: Normal range of motion. No rigidity.     Right lower leg: No edema.     Left lower leg: No edema.  Skin:    General: Skin is warm and dry.     Findings: No bruising, erythema or lesion.  Neurological:     General: No focal deficit present.     Mental Status: He is alert and oriented to person, place, and time. Mental status is at baseline.     Sensory: No sensory deficit.     Motor: No weakness.  Psychiatric:        Mood and Affect: Mood normal.     (all labs ordered are listed, but only abnormal results are displayed) Labs Reviewed  BASIC METABOLIC PANEL WITH GFR - Abnormal; Notable for the following components:      Result Value   Chloride 93 (*)    Glucose, Bld 251 (*)    BUN 40 (*)    Creatinine, Ser 3.00 (*)    GFR, Estimated 23 (*)    All other components within normal limits  HEPATIC FUNCTION PANEL - Abnormal; Notable for the following components:   Bilirubin, Direct 0.3 (*)    All other components within normal limits  TROPONIN T, HIGH SENSITIVITY - Abnormal;  Notable for the following components:   Troponin T High Sensitivity 38 (*)    All other components within normal limits  TROPONIN T, HIGH SENSITIVITY - Abnormal; Notable for the following components:   Troponin T High Sensitivity 31 (*)    All other components within normal limits  MAGNESIUM   CBC WITH DIFFERENTIAL/PLATELET  PRO BRAIN NATRIURETIC PEPTIDE  LIPASE, BLOOD  URINALYSIS, ROUTINE W REFLEX MICROSCOPIC  URINE DRUG SCREEN  BLOOD GAS, VENOUS  HEMOGLOBIN A1C  TROPONIN T, HIGH SENSITIVITY    EKG: None  Radiology: DG Shoulder Right Result Date: 06/13/2024 EXAM: 1 VIEW(S) XRAY OF THE RIGHT SHOULDER 06/13/2024 12:03:13 AM COMPARISON: Comparison with 02/25/2024. CLINICAL HISTORY: R shoulder pain. FINDINGS: BONES AND JOINTS: Degenerative changes in the glenohumeral joint. No evidence of acute fracture or dislocation. No malalignment. No focal bone lesion or bone destruction. Bone cortex appears intact. Acromioclavicular and coracoclavicular spaces are normal. SOFT TISSUES: Soft tissues are unremarkable. No abnormal calcifications. Visualized lung is unremarkable. IMPRESSION: 1. No acute fracture or dislocation. 2. Degenerative changes in the glenohumeral joint. Electronically signed by: Elsie Gravely MD 06/13/2024 12:08 AM EST RP Workstation: [id number]   CT Chest Wo Contrast Result Date: 06/12/2024 EXAM: CT CHEST WITHOUT CONTRAST 06/12/2024 06:45:03 PM TECHNIQUE: CT of the chest was performed without the administration of intravenous contrast. Multiplanar reformatted images are provided for review. Automated exposure control, iterative reconstruction, and/or weight based adjustment of the mA/kV was utilized to reduce the radiation dose to as low as reasonably achievable. COMPARISON: 03/16/2024. CLINICAL HISTORY: chest pain, shortness of breath, radiating to L back and R shoulder. FINDINGS: MEDIASTINUM: Mild cardiomegaly. Pacer wires in the right heart. The central airways are clear. LYMPH  NODES: No mediastinal, hilar or axillary lymphadenopathy. LUNGS AND PLEURA: Atelectasis in the left lower lobe adjacent to the heart. No pulmonary edema. No pleural effusion or pneumothorax. SOFT TISSUES/BONES: No acute abnormality of the bones or soft tissues. UPPER ABDOMEN: Limited images of  the upper abdomen demonstrates no acute abnormality. IMPRESSION: 1. No acute findings. 2. Left lower lobe atelectasis adjacent to the heart. 3. Mild cardiomegaly. Electronically signed by: Franky Crease MD 06/12/2024 07:03 PM EST RP Workstation: [id number]   DG Chest Portable 1 View Result Date: 06/12/2024 CLINICAL DATA:  Chest pain EXAM: PORTABLE CHEST 1 VIEW COMPARISON:  March 16, 2024 FINDINGS: Evaluation is limited by positioning. The cardiomediastinal silhouette is unchanged and enlarged in contour.LEFT chest AICD. No pleural effusion. No pneumothorax. LEFT retrocardiac opacity, likely atelectasis. Perihilar vascular fullness without overt edema. IMPRESSION: Perihilar vascular fullness without overt edema. Electronically Signed   By: Corean Salter M.D.   On: 06/12/2024 13:47    Procedures   Medications Ordered in the ED  morphine  (PF) 4 MG/ML injection 4 mg (4 mg Intravenous Not Given 06/12/24 2050)  insulin  aspart (novoLOG ) injection 0-9 Units (has no administration in time range)  oxyCODONE -acetaminophen  (PERCOCET/ROXICET) 5-325 MG per tablet 1 tablet (1 tablet Oral Given 06/12/24 1321)  morphine  (PF) 4 MG/ML injection 4 mg (4 mg Intravenous Given 06/12/24 1752)  ondansetron  (ZOFRAN ) injection 4 mg (4 mg Intravenous Given 06/12/24 1753)  pantoprazole  (PROTONIX ) injection 40 mg (40 mg Intravenous Given 06/12/24 2152)    Clinical Course as of 06/13/24 0028  Sun Jun 12, 2024  2315 Spoke with Dr. Marty with cardiology who stated that he he should be admitted for observation with patient's high risk criteria. Interrogate device and admit for observation.  [CB]    Clinical Course User Index [CB] Beola Terrall RAMAN, PA-C   Medical Decision Making Amount and/or Complexity of Data Reviewed Radiology: ordered.  Risk Prescription drug management. Decision regarding hospitalization.   This patient is a 59 year old male who presents to the ED for concern of near syncope that happened today accompanied with chest pain and shortness of breath and right shoulder pain.  Noted to have extensive cardiac history, last admitted in October of last year where he was noted to be in V-fib.  Has pacemaker in place.  On physical exam, patient is in no acute distress, afebrile, alert and orient x 4, speaking in full sentences, nontachypneic, nontachycardic.  Notably does have paced rhythm.  And tenderness around the right shoulder.  With no erythema, no swelling noted.  No lower leg edema.  However with patient's high risk history and continued chest pain, spoke with cardiology Dr. Marty who recommended admission for observation and interrogating pacemaker.  Patient care was then transferred to Dr. Silvester.  Differential diagnoses prior to evaluation: The emergent differential diagnosis includes, but is not limited to, ACS, PE, pneumonia, AAS, frozen shoulder, septic joint, septic bursitis, arthritis, metabolic disturbance, arrhythmia. This is not an exhaustive differential.   Past Medical History / Co-morbidities / Social History: Obesity, heavy smoker, GERD, cardiomyopathy, ICD in place, CHF, diabetes, polyneuropathy, IBS, with acidosis, CKD On Xarelto , with last dose yesterday, no reported missed doses.   Status post cholecystectomy  Additional history: Chart reviewed. Pertinent results include:   Patient was last admitted to the hospital on 03/16/2024 for syncopal episode with polymorphic VT/VF.  Loaded with amiodarone .  Noted to have AKI at that time additionally noting shoulder pain at that time.  Lab Tests/Imaging studies: I personally interpreted labs/imaging and the pertinent results include:   CBC  unremarkable BMP near baseline Hepatic function unremarkable Magnesium  unremarkable Lipase markable Troponin elevated at 31 with delta of 38 proBNP of 226 X-ray of right shoulder shows arthritis without any acute findings Chest x-ray and CT  scan both show Left lower lobe atelectasis and mild cardiomegaly   I agree with the radiologist interpretation.  Cardiac monitoring: EKG obtained and interpreted by myself and attending physician which shows: Sinus rhythm      Medications: I ordered medication including oxycodone , Zofran , morphine , Protonix .  I have reviewed the patients home medicines and have made adjustments as needed.  Critical Interventions: None  Social Determinants of Health:  Disposition: After consideration of the diagnostic results and the patients response to treatment, I feel that the patient would benefit from admission for observation, patient care transferred to Dr. Silvester.   Final diagnoses:  Chest pain, unspecified type  Near syncope  Acute pain of right shoulder    ED Discharge Orders     None          Beola Terrall GORMAN DEVONNA 06/13/24 GLORIANNE Ula Prentice JONELLE, MD 06/13/24 2318  "

## 2024-06-12 NOTE — ED Notes (Signed)
Verbal consent for MSE.  ?

## 2024-06-12 NOTE — ED Provider Triage Note (Signed)
 Emergency Medicine Provider Triage Evaluation Note  [name] , a 59 y.o. male  was evaluated in triage.  Pt complains of chest pain.  Review of Systems  Positive: Chest pain, back pain, shortness of breath Negative: Fevers, chills  Physical Exam  There were no vitals taken for this visit. Gen:   Awake, no distress   Resp:  Normal effort  MSK:   Moves extremities without difficulty  Medical Decision Making  Medically screening exam initiated at 12:58 PM.  Appropriate orders placed.  [name] was informed that the remainder of the evaluation will be completed by another provider, this initial triage assessment does not replace that evaluation, and the importance of remaining in the ED until their evaluation is complete.  Patient presenting for 4 days of intermittent chest pain.  Chest pain is described as left-sided with radiation across chest to right shoulder as well as to back.  He denies any recent injuries.  Pain is worsened with palpation and movements.   Melvenia Motto, MD 06/12/24 1259

## 2024-06-13 ENCOUNTER — Encounter: Admitting: Surgical

## 2024-06-13 ENCOUNTER — Observation Stay (HOSPITAL_COMMUNITY)

## 2024-06-13 ENCOUNTER — Ambulatory Visit: Admitting: Internal Medicine

## 2024-06-13 ENCOUNTER — Telehealth: Payer: Self-pay

## 2024-06-13 ENCOUNTER — Other Ambulatory Visit (HOSPITAL_COMMUNITY): Payer: Self-pay

## 2024-06-13 DIAGNOSIS — N401 Enlarged prostate with lower urinary tract symptoms: Secondary | ICD-10-CM | POA: Diagnosis not present

## 2024-06-13 DIAGNOSIS — I82622 Acute embolism and thrombosis of deep veins of left upper extremity: Secondary | ICD-10-CM | POA: Diagnosis not present

## 2024-06-13 DIAGNOSIS — R55 Syncope and collapse: Secondary | ICD-10-CM

## 2024-06-13 DIAGNOSIS — M25519 Pain in unspecified shoulder: Secondary | ICD-10-CM | POA: Diagnosis present

## 2024-06-13 DIAGNOSIS — J4489 Other specified chronic obstructive pulmonary disease: Secondary | ICD-10-CM | POA: Diagnosis not present

## 2024-06-13 DIAGNOSIS — N138 Other obstructive and reflux uropathy: Secondary | ICD-10-CM

## 2024-06-13 DIAGNOSIS — I5021 Acute systolic (congestive) heart failure: Secondary | ICD-10-CM | POA: Diagnosis not present

## 2024-06-13 DIAGNOSIS — M545 Low back pain, unspecified: Secondary | ICD-10-CM | POA: Diagnosis not present

## 2024-06-13 DIAGNOSIS — N1832 Chronic kidney disease, stage 3b: Secondary | ICD-10-CM

## 2024-06-13 DIAGNOSIS — M25511 Pain in right shoulder: Secondary | ICD-10-CM

## 2024-06-13 DIAGNOSIS — E1169 Type 2 diabetes mellitus with other specified complication: Secondary | ICD-10-CM

## 2024-06-13 DIAGNOSIS — R34 Anuria and oliguria: Secondary | ICD-10-CM

## 2024-06-13 DIAGNOSIS — Z95810 Presence of automatic (implantable) cardiac defibrillator: Secondary | ICD-10-CM

## 2024-06-13 DIAGNOSIS — E785 Hyperlipidemia, unspecified: Secondary | ICD-10-CM

## 2024-06-13 DIAGNOSIS — I5022 Chronic systolic (congestive) heart failure: Secondary | ICD-10-CM | POA: Diagnosis not present

## 2024-06-13 DIAGNOSIS — R079 Chest pain, unspecified: Secondary | ICD-10-CM

## 2024-06-13 DIAGNOSIS — R0789 Other chest pain: Secondary | ICD-10-CM | POA: Diagnosis not present

## 2024-06-13 LAB — URINALYSIS, ROUTINE W REFLEX MICROSCOPIC
Bilirubin Urine: NEGATIVE
Glucose, UA: 150 mg/dL — AB
Hgb urine dipstick: NEGATIVE
Ketones, ur: NEGATIVE mg/dL
Leukocytes,Ua: NEGATIVE
Nitrite: NEGATIVE
Protein, ur: NEGATIVE mg/dL
Specific Gravity, Urine: 1.013 (ref 1.005–1.030)
pH: 5 (ref 5.0–8.0)

## 2024-06-13 LAB — ECHOCARDIOGRAM COMPLETE
Area-P 1/2: 4.6 cm2
Height: 73 in
S' Lateral: 5.1 cm
Weight: 4304 [oz_av]

## 2024-06-13 LAB — BLOOD GAS, VENOUS
Acid-Base Excess: 2 mmol/L (ref 0.0–2.0)
Bicarbonate: 30 mmol/L — ABNORMAL HIGH (ref 20.0–28.0)
O2 Saturation: 78.6 %
Patient temperature: 36.9
pCO2, Ven: 61 mmHg — ABNORMAL HIGH (ref 44–60)
pH, Ven: 7.3 (ref 7.25–7.43)
pO2, Ven: 50 mmHg — ABNORMAL HIGH (ref 32–45)

## 2024-06-13 LAB — TROPONIN T, HIGH SENSITIVITY
Troponin T High Sensitivity: 39 ng/L — ABNORMAL HIGH (ref 0–19)
Troponin T High Sensitivity: 40 ng/L — ABNORMAL HIGH (ref 0–19)

## 2024-06-13 LAB — HEMOGLOBIN A1C
Hgb A1c MFr Bld: 9.1 % — ABNORMAL HIGH (ref 4.8–5.6)
Mean Plasma Glucose: 214.47 mg/dL

## 2024-06-13 LAB — CBG MONITORING, ED
Glucose-Capillary: 364 mg/dL — ABNORMAL HIGH (ref 70–99)
Glucose-Capillary: 223 mg/dL — ABNORMAL HIGH (ref 70–99)
Glucose-Capillary: 250 mg/dL — ABNORMAL HIGH (ref 70–99)

## 2024-06-13 LAB — GLUCOSE, CAPILLARY
Glucose-Capillary: 381 mg/dL — ABNORMAL HIGH (ref 70–99)
Glucose-Capillary: 436 mg/dL — ABNORMAL HIGH (ref 70–99)

## 2024-06-13 MED ORDER — INSULIN ASPART 100 UNIT/ML IJ SOLN
7.0000 [IU] | INTRAMUSCULAR | Status: AC
  Administered 2024-06-13: 7 [IU] via SUBCUTANEOUS

## 2024-06-13 MED ORDER — CLONAZEPAM 0.5 MG PO TABS
0.5000 mg | ORAL_TABLET | Freq: Two times a day (BID) | ORAL | Status: DC
  Administered 2024-06-13 – 2024-06-17 (×9): 0.5 mg via ORAL
  Filled 2024-06-13 (×9): qty 1

## 2024-06-13 MED ORDER — COLCHICINE 0.6 MG PO TABS
0.6000 mg | ORAL_TABLET | Freq: Every day | ORAL | Status: DC
  Administered 2024-06-13 – 2024-06-17 (×5): 0.6 mg via ORAL
  Filled 2024-06-13 (×5): qty 1

## 2024-06-13 MED ORDER — ONDANSETRON HCL 4 MG PO TABS: 4.0000 mg | ORAL_TABLET | Freq: Four times a day (QID) | ORAL | Status: DC | PRN

## 2024-06-13 MED ORDER — ACETAMINOPHEN 650 MG RE SUPP: 650.0000 mg | Freq: Four times a day (QID) | RECTAL | Status: DC | PRN

## 2024-06-13 MED ORDER — INSULIN GLARGINE-YFGN 100 UNIT/ML ~~LOC~~ SOLN
15.0000 [IU] | Freq: Every day | SUBCUTANEOUS | Status: DC
  Administered 2024-06-13: 15 [IU] via SUBCUTANEOUS
  Filled 2024-06-13 (×3): qty 0.15

## 2024-06-13 MED ORDER — PANTOPRAZOLE SODIUM 40 MG PO TBEC
40.0000 mg | DELAYED_RELEASE_TABLET | Freq: Every day | ORAL | Status: DC
  Administered 2024-06-13 – 2024-06-17 (×5): 40 mg via ORAL
  Filled 2024-06-13 (×6): qty 1

## 2024-06-13 MED ORDER — METHOCARBAMOL 500 MG PO TABS
500.0000 mg | ORAL_TABLET | Freq: Every day | ORAL | Status: DC
  Administered 2024-06-13 – 2024-06-17 (×5): 500 mg via ORAL
  Filled 2024-06-13 (×5): qty 1

## 2024-06-13 MED ORDER — HYDROMORPHONE HCL 1 MG/ML IJ SOLN
0.5000 mg | INTRAMUSCULAR | Status: DC | PRN
  Administered 2024-06-13 – 2024-06-16 (×11): 0.5 mg via INTRAVENOUS
  Filled 2024-06-13 (×11): qty 1

## 2024-06-13 MED ORDER — SODIUM CHLORIDE 0.9% FLUSH
3.0000 mL | Freq: Two times a day (BID) | INTRAVENOUS | Status: DC
  Administered 2024-06-13 – 2024-06-17 (×8): 3 mL via INTRAVENOUS

## 2024-06-13 MED ORDER — SODIUM CHLORIDE 0.9% FLUSH: 3.0000 mL | INTRAVENOUS | Status: DC | PRN

## 2024-06-13 MED ORDER — LIDOCAINE 5 % EX PTCH
1.0000 | MEDICATED_PATCH | CUTANEOUS | Status: DC
  Administered 2024-06-13 – 2024-06-15 (×3): 1 via TRANSDERMAL
  Filled 2024-06-13 (×3): qty 1

## 2024-06-13 MED ORDER — AMIODARONE HCL 200 MG PO TABS
200.0000 mg | ORAL_TABLET | Freq: Every day | ORAL | Status: DC
  Administered 2024-06-13 – 2024-06-17 (×5): 200 mg via ORAL
  Filled 2024-06-13 (×5): qty 1

## 2024-06-13 MED ORDER — TORSEMIDE 20 MG PO TABS
60.0000 mg | ORAL_TABLET | Freq: Every day | ORAL | Status: DC
  Administered 2024-06-13 – 2024-06-14 (×2): 60 mg via ORAL
  Filled 2024-06-13 (×2): qty 3

## 2024-06-13 MED ORDER — FLUTICASONE PROPIONATE 50 MCG/ACT NA SUSP
2.0000 | Freq: Every day | NASAL | Status: DC
  Administered 2024-06-13 – 2024-06-17 (×5): 2 via NASAL
  Filled 2024-06-13: qty 16

## 2024-06-13 MED ORDER — ALBUTEROL SULFATE (2.5 MG/3ML) 0.083% IN NEBU: 2.5000 mg | INHALATION_SOLUTION | RESPIRATORY_TRACT | Status: DC | PRN

## 2024-06-13 MED ORDER — PERFLUTREN LIPID MICROSPHERE
1.0000 mL | INTRAVENOUS | Status: AC | PRN
  Administered 2024-06-13: 3 mL via INTRAVENOUS

## 2024-06-13 MED ORDER — GABAPENTIN 300 MG PO CAPS
300.0000 mg | ORAL_CAPSULE | Freq: Every day | ORAL | Status: DC
  Administered 2024-06-13 – 2024-06-14 (×2): 300 mg via ORAL
  Filled 2024-06-13 (×2): qty 1

## 2024-06-13 MED ORDER — OXYCODONE HCL 5 MG PO TABS
5.0000 mg | ORAL_TABLET | Freq: Four times a day (QID) | ORAL | Status: DC | PRN
  Administered 2024-06-13 – 2024-06-14 (×2): 5 mg via ORAL
  Filled 2024-06-13 (×2): qty 1

## 2024-06-13 MED ORDER — INSULIN ASPART 100 UNIT/ML IJ SOLN: 0.0000 [IU] | Freq: Three times a day (TID) | INTRAMUSCULAR | Status: DC

## 2024-06-13 MED ORDER — SODIUM CHLORIDE 0.9 % IV SOLN: 250.0000 mL | INTRAVENOUS | Status: AC | PRN

## 2024-06-13 MED ORDER — ONDANSETRON HCL 4 MG/2ML IJ SOLN: 4.0000 mg | Freq: Four times a day (QID) | INTRAMUSCULAR | Status: DC | PRN

## 2024-06-13 MED ORDER — PREDNISONE 20 MG PO TABS
40.0000 mg | ORAL_TABLET | Freq: Every day | ORAL | Status: DC
  Administered 2024-06-13 – 2024-06-16 (×4): 40 mg via ORAL
  Filled 2024-06-13 (×4): qty 2

## 2024-06-13 MED ORDER — ACETAMINOPHEN 325 MG PO TABS: 650.0000 mg | ORAL_TABLET | Freq: Four times a day (QID) | ORAL | Status: DC | PRN

## 2024-06-13 MED ORDER — TAMSULOSIN HCL 0.4 MG PO CAPS
0.8000 mg | ORAL_CAPSULE | Freq: Every day | ORAL | Status: DC
  Administered 2024-06-13 – 2024-06-17 (×5): 0.8 mg via ORAL
  Filled 2024-06-13 (×5): qty 2

## 2024-06-13 MED ORDER — FLUTICASONE FUROATE-VILANTEROL 100-25 MCG/ACT IN AEPB
1.0000 | INHALATION_SPRAY | Freq: Every day | RESPIRATORY_TRACT | Status: DC
  Administered 2024-06-13 – 2024-06-17 (×5): 1 via RESPIRATORY_TRACT
  Filled 2024-06-13 (×2): qty 28

## 2024-06-13 MED ORDER — HYDROMORPHONE HCL 1 MG/ML IJ SOLN
0.5000 mg | INTRAMUSCULAR | Status: DC | PRN
  Administered 2024-06-13 (×2): 0.5 mg via INTRAVENOUS
  Filled 2024-06-13 (×2): qty 1

## 2024-06-13 MED ORDER — ATORVASTATIN CALCIUM 10 MG PO TABS
10.0000 mg | ORAL_TABLET | Freq: Every day | ORAL | Status: DC
  Administered 2024-06-13 – 2024-06-17 (×5): 10 mg via ORAL
  Filled 2024-06-13 (×5): qty 1

## 2024-06-13 MED ORDER — INSULIN ASPART 100 UNIT/ML IJ SOLN: 0.0000 [IU] | Freq: Every day | INTRAMUSCULAR | Status: DC

## 2024-06-13 MED ORDER — SERTRALINE HCL 50 MG PO TABS
75.0000 mg | ORAL_TABLET | Freq: Every day | ORAL | Status: DC
  Administered 2024-06-13 – 2024-06-17 (×5): 75 mg via ORAL
  Filled 2024-06-13 (×5): qty 2

## 2024-06-13 MED ORDER — LORATADINE 10 MG PO TABS
10.0000 mg | ORAL_TABLET | Freq: Every day | ORAL | Status: DC
  Administered 2024-06-13 – 2024-06-17 (×5): 10 mg via ORAL
  Filled 2024-06-13 (×5): qty 1

## 2024-06-13 MED ORDER — INSULIN ASPART 100 UNIT/ML IJ SOLN
0.0000 [IU] | INTRAMUSCULAR | Status: DC
  Administered 2024-06-13 (×2): 9 [IU] via SUBCUTANEOUS
  Administered 2024-06-14: 7 [IU] via SUBCUTANEOUS
  Administered 2024-06-14: 2 [IU] via SUBCUTANEOUS
  Administered 2024-06-14: 7 [IU] via SUBCUTANEOUS
  Filled 2024-06-13 (×2): qty 7
  Filled 2024-06-13: qty 1
  Filled 2024-06-13 (×2): qty 9
  Filled 2024-06-13: qty 2
  Filled 2024-06-13: qty 7

## 2024-06-13 MED ORDER — RIVAROXABAN 20 MG PO TABS
20.0000 mg | ORAL_TABLET | Freq: Every day | ORAL | Status: DC
  Administered 2024-06-13 – 2024-06-16 (×4): 20 mg via ORAL
  Filled 2024-06-13 (×4): qty 1

## 2024-06-13 NOTE — Progress Notes (Signed)
 Echocardiogram 2D Echocardiogram has been performed.  [name] 06/13/2024, 12:26 PM

## 2024-06-13 NOTE — Progress Notes (Signed)
" °   06/13/24 1308  TOC Brief Assessment  Insurance and Status Reviewed  Patient has primary care physician Yes  Home environment has been reviewed From home c/brother  Prior level of function: Independent  Prior/Current Home Services No current home services  Social Drivers of Health Review SDOH reviewed needs interventions  Readmission risk has been reviewed Yes  Transition of care needs transition of care needs identified, TOC will continue to follow   Pt presented to ED c/o chest pain. Hx of DM2, HTN, heart failure, V-tach, VF, status post ICD, OSA not on CPAP, CKD 3B, GERD, kidney stones, obesity, chronic back pain. Cardiology consult completed. PT eval pending.   Current DME: cane, walker, shower seat, BSC.  Transition of Care Department [facility]) has reviewed patient and will continue to monitor patient advancement.  If new patient needs arise, please place a TOC consult. "

## 2024-06-13 NOTE — Assessment & Plan Note (Signed)
 On Flomax  will continue

## 2024-06-13 NOTE — Assessment & Plan Note (Signed)
-  chronic avoid nephrotoxic medications such as NSAIDs, Vanco Zosyn combo,  avoid hypotension, continue to follow renal function

## 2024-06-13 NOTE — Assessment & Plan Note (Signed)
 Currently does not appear to be fluid overloaded.  Continue home medications including amiodarone  20 mg daily appreciate cardiology consult Continue torsemide  60 mg daily

## 2024-06-13 NOTE — H&P (Signed)
 "    [name] FMW:[id number] DOB: 1966/03/25 DOA: 06/12/2024     PCP: Vicci Barnie NOVAK, MD   Outpatient Specialists:   CARDS: Dr. Redell Shallow, MD   Patient arrived to ER on 06/12/24 at 1251 Referred by Attending Ula Prentice SAUNDERS, MD   Patient coming from:    Home   Chief Complaint: Chest pain   HPI: [name] is a 59 y.o. male with medical history significant of systolic CHF, status post ICD, history of V. tach VF, OSA not on CPAP, CKD 3B, DM2, GERD, HTN, kidney stones, obesity    Presented with   chest pain and presyncope like episodes similar to the ones he have experienced in the past before passing out Comes in from home with chest pain for the past 4 days shortness of breath with exertion Patient has known history of systolic CHF and left heart cath with normal anatomy had ICD placed  Patient has been having chest/shoulder pain for the past 4 days it is radiating down to his right arm to his elbow and also radiating to his left neck coming and going switching from right to left lasting only few seconds at a time it seems worse with palpation prior to pain he has been seeing blue with slow motion vision which is what he experienced before prior to passing out no other symptoms  In ER trop 31-38 Seen by cardiology in the emergency department felt the chest pain is noncardiac given history Radiologist recommended automatic device interrogation to ensure there was no recent V. tach VF And observe overnight given presyncope like prodrome episodes    Patient reports significant right  shoulder pain he states it is kind of chronic but has been bothering him severely for the past 4 days no fevers no chills he have had gout in his shoulder before but unsure if this feels the same or different. He also has has had chronic back pain but feels like he has gotten worse is getting difficult for him to ambulate he has been spending a lot of time in bed Reports he have had decreased  urine output all day today    Regarding pertinent Chronic problems:    Hyperlipidemia -  on statins Lipitor (atorvastatin )  Lipid Panel     Component Value Date/Time   CHOL 166 02/13/2023 1658   TRIG 283 (H) 02/13/2023 1658   HDL 44 02/13/2023 1658   CHOLHDL 3.8 02/13/2023 1658   CHOLHDL 4 05/08/2009 1040   VLDL 27.0 05/08/2009 1040   LDLCALC 76 02/13/2023 1658   LABVLDL 46 (H) 02/13/2023 1658     HTN on    chronic CHF  systolic as per echo in 2015 at that time EF was 40% Supposed to be on torsemide  Zaroxolyn      DM 2 -  Lab Results  Component Value Date   HGBA1C 8.9 (A) 04/01/2024   on insulin ,     obesity-   BMI Readings from Last 1 Encounters:  06/12/24 35.49 kg/m         OSA - noncompliant with CPAP       CKD stage IIIb baseline Cr  3.8 Estimated Creatinine Clearance: 36.3 mL/min (A) (by C-G formula based on SCr of 3 mg/dL (H)).  Lab Results  Component Value Date   CREATININE 3.00 (H) 06/12/2024   CREATININE 2.83 (H) 05/23/2024   CREATININE 2.87 (H) 04/29/2024   Lab Results  Component Value Date   NA 138 06/12/2024  CL 93 (L) 06/12/2024   K 3.5 06/12/2024   CO2 30 06/12/2024   BUN 40 (H) 06/12/2024   CREATININE 3.00 (H) 06/12/2024   GFRNONAA 23 (L) 06/12/2024   CALCIUM  9.8 06/12/2024   PHOS 2.4 (L) 02/27/2024   ALBUMIN  4.4 06/12/2024   GLUCOSE 251 (H) 06/12/2024   BPH - on Flomax         While in ER: Clinical Course as of 06/13/24 0007  Sun Jun 12, 2024  2315 Spoke with Dr. Marty with cardiology who stated that he he should be admitted for observation with patient's high risk criteria. Interrogate device and admit for observation.  [CB]    Clinical Course User Index [CB] Beola Terrall RAMAN, PA-C         Lab Orders         Magnesium          CBC with Differential/Platelet         Pro Brain natriuretic peptide (order if patient c/o SOB ONLY)         Urinalysis, Routine w reflex microscopic -Urine, Clean Catch         Basic metabolic panel  with GFR         Hepatic function panel         Lipase, blood         Urine Drug Screen      Right shoulder No acute fracture or dislocation. 2. Degenerative changes in the glenohumeral joint.     CT  chest -  1. No acute findings. 2. Left lower lobe atelectasis adjacent to the heart. 3. Mild cardiomegaly.  Following Medications were ordered in ER: Medications  morphine  (PF) 4 MG/ML injection 4 mg (4 mg Intravenous Not Given 06/12/24 2050)  oxyCODONE -acetaminophen  (PERCOCET/ROXICET) 5-325 MG per tablet 1 tablet (1 tablet Oral Given 06/12/24 1321)  morphine  (PF) 4 MG/ML injection 4 mg (4 mg Intravenous Given 06/12/24 1752)  ondansetron  (ZOFRAN ) injection 4 mg (4 mg Intravenous Given 06/12/24 1753)  pantoprazole  (PROTONIX ) injection 40 mg (40 mg Intravenous Given 06/12/24 2152)    _______________________________________________________ ER Provider Called:    Cardiology    Dr. Marty  They Recommend admit to medicine    SEEN in ER     ED Triage Vitals  Encounter Vitals Group     BP 06/12/24 1306 112/75     Girls Systolic BP Percentile --      Girls Diastolic BP Percentile --      Boys Systolic BP Percentile --      Boys Diastolic BP Percentile --      Pulse Rate 06/12/24 1306 96     Resp 06/12/24 1306 19     Temp 06/12/24 1306 98.4 F (36.9 C)     Temp Source 06/12/24 1626 Oral     SpO2 06/12/24 1306 94 %     Weight 06/12/24 1309 269 lb (122 kg)     Height 06/12/24 1309 6' 1 (1.854 m)     Head Circumference --      Peak Flow --      Pain Score 06/12/24 1309 10     Pain Loc --      Pain Education --      Exclude from Growth Chart --   UFJK(75)@     _________________________________________ Significant initial  Findings: Abnormal Labs Reviewed  BASIC METABOLIC PANEL WITH GFR - Abnormal; Notable for the following components:      Result Value   Chloride 93 (*)  Glucose, Bld 251 (*)    BUN 40 (*)    Creatinine, Ser 3.00 (*)    GFR, Estimated 23 (*)    All other  components within normal limits  HEPATIC FUNCTION PANEL - Abnormal; Notable for the following components:   Bilirubin, Direct 0.3 (*)    All other components within normal limits  TROPONIN T, HIGH SENSITIVITY - Abnormal; Notable for the following components:   Troponin T High Sensitivity 38 (*)    All other components within normal limits  TROPONIN T, HIGH SENSITIVITY - Abnormal; Notable for the following components:   Troponin T High Sensitivity 31 (*)    All other components within normal limits       _________________________ Troponin 38 -39   ECG: Ordered Personally reviewed and interpreted by me showing: HR : 96 Rhythm:Sinus rhythm with 1st degree A-V block Possible Left atrial enlargement Right superior axis deviation Non-specific intra-ventricular conduction block Cannot rule out Septal infarct , age undetermined Abnormal ECG When compared with ECG QTC 419  BNP (last 3 results) Recent Labs    01/18/24 1558 03/16/24 1623 04/21/24 1432  BNP 23.3 21.2 20.6    The recent clinical data is shown below. Vitals:   06/12/24 2100 06/12/24 2130 06/12/24 2239 06/12/24 2311  BP: (!) 136/100 117/83  120/79  Pulse: 83 82 92 95  Resp:    20  Temp:    98.4 F (36.9 C)  TempSrc:      SpO2: 91% 91% 90% (!) 87%  Weight:      Height:        WBC     Component Value Date/Time   WBC 9.8 06/12/2024 1311   LYMPHSABS 1.7 06/12/2024 1311   LYMPHSABS 1.7 06/28/2018 1440   MONOABS 0.9 06/12/2024 1311   EOSABS 0.0 06/12/2024 1311   EOSABS 0.3 06/28/2018 1440   BASOSABS 0.1 06/12/2024 1311   BASOSABS 0.0 06/28/2018 1440    ________________________________________________________________   Blood Gas result:  pH   7.3 Acid-Base Excess 2.0 mmol/L   pCO2, Ven 61 High  mmHg O2 Saturation 78.6 %  pO2, Ven 50 High  mmHg      ABG    Component Value Date/Time   PHART 7.410 05/08/2016 1432   PCO2ART 38.6 05/08/2016 1432   PO2ART 91.2 05/08/2016 1432   HCO3 28.2 (H) 10/07/2023 2225    TCO2 35 (H) 02/25/2024 1156   ACIDBASEDEF 4.0 (H) 05/12/2022 1533   ACIDBASEDEF 5.0 (H) 05/12/2022 1533   O2SAT 93 10/07/2023 2225      __________________________________________________________ Recent Labs  Lab 06/12/24 1311 06/12/24 1556  NA  --  138  K  --  3.5  CO2  --  30  GLUCOSE  --  251*  BUN  --  40*  CREATININE  --  3.00*  CALCIUM   --  9.8  MG 1.7  --     Cr  Up from baseline see below Lab Results  Component Value Date   CREATININE 3.00 (H) 06/12/2024   CREATININE 2.83 (H) 05/23/2024   CREATININE 2.87 (H) 04/29/2024    Recent Labs  Lab 06/12/24 1556  AST 30  ALT 44  ALKPHOS 79  BILITOT 0.7  PROT 8.1  ALBUMIN  4.4   Lab Results  Component Value Date   CALCIUM  9.8 06/12/2024   PHOS 2.4 (L) 02/27/2024        Plt: Lab Results  Component Value Date   PLT 281 06/12/2024  Recent Labs  Lab 06/12/24 1311  WBC 9.8  NEUTROABS 7.1  HGB 14.2  HCT 41.9  MCV 89.5  PLT 281    HG/HCT  stable,     Component Value Date/Time   HGB 14.2 06/12/2024 1311   HGB 13.0 03/28/2024 1632   HCT 41.9 06/12/2024 1311   HCT 38.5 03/28/2024 1632   MCV 89.5 06/12/2024 1311   MCV 89 03/28/2024 1632     Recent Labs  Lab 06/12/24 1556  LIPASE 27    _______________________________________________ Hospitalist was called for admission for   Chest pain   Near syncope    Acute pain of right shoulder     The following Work up has been ordered so far:  Orders Placed This Encounter  Procedures   DG Chest Portable 1 View   CT Chest Wo Contrast   DG Shoulder Right   Magnesium    CBC with Differential/Platelet   Pro Brain natriuretic peptide (order if patient c/o SOB ONLY)   Urinalysis, Routine w reflex microscopic -Urine, Clean Catch   Basic metabolic panel with GFR   Hepatic function panel   Lipase, blood   Urine Drug Screen   Inpatient consult to Cardiology   Consult for [facility] Admission   ED EKG     OTHER Significant initial   Findings:  labs showing:     DM  labs:  HbA1C: Recent Labs    10/08/23 1152 12/15/23 1608 04/01/24 1625  HGBA1C 9.3* 8.5* 8.9*       CBG (last 3)  No results for input(s): GLUCAP in the last 72 hours.        Cultures:    Component Value Date/Time   SDES BLOOD LEFT HAND 10/08/2023 1152   SDES BLOOD LEFT HAND 10/08/2023 1152   SPECREQUEST  10/08/2023 1152    BOTTLES DRAWN AEROBIC AND ANAEROBIC Blood Culture adequate volume   SPECREQUEST AEROBIC BOTTLE ONLY Blood Culture adequate volume 10/08/2023 1152   CULT  10/08/2023 1152    NO GROWTH 5 DAYS Performed at [facility], [address], [place], [place] [address]    CULT  10/08/2023 1152    NO GROWTH 5 DAYS Performed at [facility], [address] [place], [place] [address]    REPTSTATUS 10/13/2023 FINAL 10/08/2023 1152   REPTSTATUS 10/13/2023 FINAL 10/08/2023 1152     Radiological Exams on Admission: CT Chest Wo Contrast Result Date: 06/12/2024 EXAM: CT CHEST WITHOUT CONTRAST 06/12/2024 06:45:03 PM TECHNIQUE: CT of the chest was performed without the administration of intravenous contrast. Multiplanar reformatted images are provided for review. Automated exposure control, iterative reconstruction, and/or weight based adjustment of the mA/kV was utilized to reduce the radiation dose to as low as reasonably achievable. COMPARISON: 03/16/2024. CLINICAL HISTORY: chest pain, shortness of breath, radiating to L back and R shoulder. FINDINGS: MEDIASTINUM: Mild cardiomegaly. Pacer wires in the right heart. The central airways are clear. LYMPH NODES: No mediastinal, hilar or axillary lymphadenopathy. LUNGS AND PLEURA: Atelectasis in the left lower lobe adjacent to the heart. No pulmonary edema. No pleural effusion or pneumothorax. SOFT TISSUES/BONES: No acute abnormality of the bones or soft tissues. UPPER ABDOMEN: Limited images of the upper abdomen demonstrates no acute abnormality. IMPRESSION: 1. No acute findings.  2. Left lower lobe atelectasis adjacent to the heart. 3. Mild cardiomegaly. Electronically signed by: Franky Crease MD 06/12/2024 07:03 PM EST RP Workstation: [id number]   DG Chest Portable 1 View Result Date: 06/12/2024 CLINICAL DATA:  Chest  pain EXAM: PORTABLE CHEST 1 VIEW COMPARISON:  March 16, 2024 FINDINGS: Evaluation is limited by positioning. The cardiomediastinal silhouette is unchanged and enlarged in contour.LEFT chest AICD. No pleural effusion. No pneumothorax. LEFT retrocardiac opacity, likely atelectasis. Perihilar vascular fullness without overt edema. IMPRESSION: Perihilar vascular fullness without overt edema. Electronically Signed   By: Corean Salter M.D.   On: 06/12/2024 13:47   _______________________________________________________________________________________________________ Latest  Blood pressure 120/79, pulse 95, temperature 98.4 F (36.9 C), resp. rate 20, height 6' 1 (1.854 m), weight 122 kg, SpO2 (!) 87%.   Vitals  labs and radiology finding personally reviewed  Review of Systems:    Pertinent positives include:  chest pain, right shoulder pain  Constitutional:  No weight loss, night sweats, Fevers, chills, fatigue, weight loss  HEENT:  No headaches, Difficulty swallowing,Tooth/dental problems,Sore throat,  No sneezing, itching, ear ache, nasal congestion, post nasal drip,  Cardio-vascular:  No Orthopnea, PND, anasarca, dizziness, palpitations.no Bilateral lower extremity swelling  GI:  No heartburn, indigestion, abdominal pain, nausea, vomiting, diarrhea, change in bowel habits, loss of appetite, melena, blood in stool, hematemesis Resp:  no shortness of breath at rest. No dyspnea on exertion, No excess mucus, no productive cough, No non-productive cough, No coughing up of blood.No change in color of mucus.No wheezing. Skin:  no rash or lesions. No jaundice GU:  no dysuria, change in color of urine, no urgency or frequency. No straining to urinate.  No  flank pain.  Musculoskeletal:  No joint pain or no joint swelling. No decreased range of motion. No back pain.  Psych:  No change in mood or affect. No depression or anxiety. No memory loss.  Neuro: no localizing neurological complaints, no tingling, no weakness, no double vision, no gait abnormality, no slurred speech, no confusion  All systems reviewed and apart from HOPI all are negative _______________________________________________________________________________________________ Past Medical History:   Past Medical History:  Diagnosis Date   AICD (automatic cardioverter/defibrillator) present    Chronic bronchitis (HCC)    get it most q yr (12/23/2013)   Chronic systolic (congestive) heart failure (HCC)    Fracture of left humerus    a. 07/2013.   GERD (gastroesophageal reflux disease)    not at present time   Heart murmur    born w/it    History of renal calculi    Hypertension    Kidney stones    NICM (nonischemic cardiomyopathy) (HCC)    a. 07/2013 Echo: EF 20-25%, diff HK, Gr2 DD, mild MR, mod dil LA/RA. 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    Shockable heart rhythm detected by automated external defibrillator    Syncope    Type II diabetes mellitus (HCC) 2006      Past Surgical History:  Procedure Laterality Date   CARDIAC CATHETERIZATION  08/10/2013   CHOLECYSTECTOMY  ~ 2010   IMPLANTABLE CARDIOVERTER DEFIBRILLATOR IMPLANT  12/23/2013   STJ Fortify ICD implanted by Dr Kelsie for cardiomyopathy and syncope   IMPLANTABLE CARDIOVERTER DEFIBRILLATOR IMPLANT N/A 12/23/2013   Procedure: IMPLANTABLE CARDIOVERTER DEFIBRILLATOR IMPLANT;  Surgeon: Lynwood JONETTA Kelsie, MD;  Location: [facility] CATH LAB;  Service: Cardiovascular;  Laterality: N/A;   INGUINAL HERNIA REPAIR Left 03/01/2018   Procedure: OPEN REPAIR OF LEFT INGUINAL HERNIA WITH MESH;  Surgeon: Tanda Locus, MD;  Location: [facility] ORS;  Service:  General;  Laterality: Left;   LEAD REVISION/REPAIR N/A 05/15/2022   Procedure: LEAD REVISION/REPAIR;  Surgeon: [name] Ole DASEN, MD;  Location[facility] INVASIVE CV LAB;  Service: Cardiovascular;  Laterality: N/A;   LEFT HEART CATHETERIZATION WITH CORONARY ANGIOGRAM N/A 08/10/2013   Procedure: LEFT HEART CATHETERIZATION WITH CORONARY ANGIOGRAM;  Surgeon: Candyce GORMAN Reek, MD;  Location: [facility] CATH LAB;  Service: Cardiovascular;  Laterality: N/A;   ORIF HUMERUS FRACTURE Left 08/12/2013   Procedure: OPEN REDUCTION INTERNAL FIXATION (ORIF) HUMERAL SHAFT FRACTURE;  Surgeon: Jerona LULLA Sage, MD;  Location: [facility] OR;  Service: Orthopedics;  Laterality: Left;  Open Reduction Internal Fixation Left Humerus   RIGHT/LEFT HEART CATH AND CORONARY ANGIOGRAPHY N/A 05/12/2022   Procedure: RIGHT/LEFT HEART CATH AND CORONARY ANGIOGRAPHY;  Surgeon: Gardenia Led, DO;  Location: [facility] INVASIVE CV LAB;  Service: Cardiovascular;  Laterality: N/A;   URETEROSCOPY     laser for kidney stones    Social History:  Ambulatory  cane,       reports that he quit smoking about 10 years ago. His smoking use included cigarettes. He started smoking about 38 years ago. He has a 28 pack-year smoking history. He has never used smokeless tobacco. He reports that he does not currently use alcohol. He reports current drug use. Drug: Marijuana.     Family History:   Family History  Problem Relation Age of Onset   Diabetes Mother    Arthritis Mother    Hypertension Mother    Diabetes Father    Arthritis Father    Hypertension Father    Hypertension Brother    Mental illness Brother    Parkinson's disease Paternal Grandfather    ______________________________________________________________________________________________ Allergies: Allergies[1]   Prior to Admission medications  Medication Sig Start Date End Date Taking? Authorizing Provider  acetaminophen -codeine  (TYLENOL  #3) 300-30 MG tablet Take 1 tablet by mouth every 8  (eight) hours as needed for moderate pain (pain score 4-6) or severe pain (pain score 7-10). 05/12/24   Williams, Megan E, NP  allopurinol  (ZYLOPRIM ) 100 MG tablet Take 1 tablet (100 mg total) by mouth daily. 04/21/24   Lee, Jordan, NP  amiodarone  (PACERONE ) 200 MG tablet Take 200 mg by mouth daily.    [provider]  atorvastatin  (LIPITOR) 10 MG tablet Take 1 tablet (10 mg total) by mouth daily. 12/14/23   Vicci Barnie NOVAK, MD  benzonatate  (TESSALON ) 200 MG capsule Take 200 mg by mouth 2 (two) times daily as needed for cough. 11/01/23   [provider]  cetirizine  (ZYRTEC ) 10 MG tablet Take 1 tablet by mouth once daily 05/08/24   Vicci Barnie NOVAK, MD  cholecalciferol  (VITAMIN D3) 25 MCG (1000 UNIT) tablet Take 1,000 Units by mouth in the morning.    [provider]  clonazePAM  (KLONOPIN ) 0.5 MG tablet Take 1 tablet (0.5 mg total) by mouth 2 (two) times daily. for anxiety 05/19/24   Carvin Arvella HERO, MD  Clotrimazole  1 % OINT Apply 1 Application topically 2 (two) times daily as needed (irritation). 02/28/24   Lelon Hamilton T, PA-C  colchicine  0.6 MG tablet Take 0.5 tablets (0.3 mg total) by mouth every Monday, Wednesday, and Friday. 02/29/24   Lelon Hamilton T, PA-C  Continuous Glucose Sensor (FREESTYLE LIBRE 2 PLUS SENSOR) MISC Change sensor every 15 days. 02/17/24   Vicci Barnie NOVAK, MD  EPINEPHrine  0.3 mg/0.3 mL IJ SOAJ injection Inject 0.3 mg into the muscle as needed. 01/30/22   Iva Marty Saltness, MD  fluticasone  (FLONASE ) 50 MCG/ACT nasal spray Place 2 sprays into both nostrils daily. 11/16/23   Hedges, Reyes, PA-C  gabapentin  (  NEURONTIN ) 300 MG capsule Take 1 capsule (300 mg total) by mouth at bedtime. 11/25/23   Vicci Barnie NOVAK, MD  HYDROcodone -acetaminophen  (NORCO/VICODIN) 5-325 MG tablet Take 1-2 tablets by mouth every 4 (four) hours as needed for moderate pain (pain score 4-6) or severe pain (pain score 7-10). 03/01/24   Mcarthur Pick, MD  insulin  aspart  (NOVOLOG  FLEXPEN) 100 UNIT/ML FlexPen Inject 22 Units into the skin 2 (two) times daily. 02/28/24   Lelon Hamilton T, PA-C  insulin  glargine (LANTUS  SOLOSTAR) 100 UNIT/ML Solostar Pen Inject 30 Units into the skin at bedtime. 02/28/24   Lelon Hamilton T, PA-C  Insulin  Pen Needle (PEN NEEDLES) 32G X 4 MM MISC Use to inject insulin  3 times daily. 06/07/24   Vicci Barnie NOVAK, MD  latanoprost (XALATAN) 0.005 % ophthalmic solution Place 1 drop into both eyes at bedtime. 02/16/24   [provider]  magnesium  oxide (MAG-OX) 400 MG tablet Take 1 tablet (400 mg total) by mouth daily. 03/01/24   Sigdel, Santosh, MD  metolazone  (ZAROXOLYN ) 5 MG tablet Take 1 tablet (5 mg total) by mouth once a week. 11/25/23 03/10/25  Vicci Barnie NOVAK, MD  nystatin  (MYCOSTATIN /NYSTOP ) powder Apply 1 Application topically 3 (three) times daily. 11/25/23   Vicci Barnie NOVAK, MD  pantoprazole  (PROTONIX ) 40 MG tablet Take 1 tablet (40 mg total) by mouth daily. 03/07/24   Vicci Barnie NOVAK, MD  potassium chloride  SA (KLOR-CON  M) 20 MEQ tablet Take 2 tablets (40 mEq total) by mouth 2 (two) times daily. Take an extra 20 mEq when you take Metolazone . 03/01/24   Mcarthur Pick, MD  prednisoLONE  acetate (PRED FORTE ) 1 % ophthalmic suspension Place 1 drop into the left eye at bedtime. 02/09/24   [provider]  rivaroxaban  (XARELTO ) 20 MG TABS tablet Take 1 tablet (20 mg total) by mouth daily with supper. 02/05/24   Vicci Barnie NOVAK, MD  sertraline  (ZOLOFT ) 50 MG tablet Take 1.5 tablets (75 mg total) by mouth daily. 05/19/24   Carvin Arvella HERO, MD  SYMBICORT  160-4.5 MCG/ACT inhaler Inhale 2 puffs into the lungs in the morning and at bedtime. 08/28/23   [provider]  tamsulosin  (FLOMAX ) 0.4 MG CAPS capsule Take 2 capsules (0.8 mg total) by mouth daily. 11/25/23   Vicci Barnie NOVAK, MD  torsemide  (DEMADEX ) 20 MG tablet Take 3 tablets (60 mg total) by mouth daily. 04/21/24   Lee, Jordan, NP     ___________________________________________________________________________________________________ Physical Exam:    06/12/2024   11:11 PM 06/12/2024   10:39 PM 06/12/2024    9:30 PM  Vitals with BMI  Systolic 120  117  Diastolic 79  83  Pulse 95 92 82     1. General:  in No  Acute distress   Chronically ill  -appearing 2. Psychological: Alert and   Oriented 3. Head/ENT:   Moist   Mucous Membranes                          Head Non traumatic, neck supple                            Poor Dentition 4. SKIN:  decreased Skin turgor,  Skin clean Dry and intact no rash    5. Heart: Regular rate and rhythm no  Murmur, no Rub or gallop 6. Lungs:   no wheezes or crackles   7. Abdomen: Soft,  non-tender, distended   obese  bowel sounds present 8. Lower extremities: no clubbing, cyanosis, no  edema 9. Neurologically   strength 5 out of 5 in lower extremities patient has significant pain in shoulders unable to raise upper extremities t 10. MSK: Normal range of motion    Chart has been reviewed  ______________________________________________________________________________________________  Assessment/Plan 59 y.o. male with medical history significant of systolic CHF, status post ICD, history of V. tach VF, OSA not on CPAP, CKD 3B, DM2, GERD, HTN, kidney stones, obesity  Admitted for   Chest pain , Near syncope , Acute pain of right shoulder     Present on Admission:  Chest pain  Chronic systolic congestive heart failure (HCC)  Asthma-COPD overlap syndrome (HCC)  Stage 3b chronic kidney disease (HCC)  Type 2 diabetes mellitus with hyperlipidemia (HCC)  Acute deep vein thrombosis (DVT) of left upper extremity (HCC)  BPH with obstruction/lower urinary tract symptoms  ICD (implantable cardioverter-defibrillator) in place  Shoulder pain  Lumbar back pain  Decreased urine output     Chronic systolic congestive heart failure (HCC) Currently does not appear to be fluid overloaded.   Continue home medications including amiodarone  20 mg daily appreciate cardiology consult Continue torsemide  60 mg daily  Asthma-COPD overlap syndrome (HCC) Chronic stable continue home medications  Stage 3b chronic kidney disease (HCC)  -chronic avoid nephrotoxic medications such as NSAIDs, Vanco Zosyn  combo,  avoid hypotension, continue to follow renal function   Type 2 diabetes mellitus with hyperlipidemia (HCC) Order sliding scale continue insulin  Lantus  decreased down to 15 units while have had decreased p.o. intake  Acute deep vein thrombosis (DVT) of left upper extremity (HCC) On Xarelto  continue  BPH with obstruction/lower urinary tract symptoms On Flomax  will continue  Chest pain Atypical Most likely related to shoulder pain. Supportive management Noted troponin elevated but stable appreciate cardiology involvement echo in the morning  ICD (implantable cardioverter-defibrillator) in place ICD has been interrogated as per cardiology recommendations await results of interrogation.  Patient did at some point endorse that he felt somewhat lightheaded currently back to baseline monitor on telemetry for any evidence of V. tach V-fib  Shoulder pain Unclear etiology no redness or swelling noted at this time although gout cannot be excluded.  Plain images unremarkable very tender to palpation. Patient states that he will not tolerate MRI Pain management for tonight consider orthopedics consult in a.m.  Lumbar back pain Reports acute on chronic back pain has been having persistent back pain but it has gotten worse over the past few days.  Patient states that he will not tolerate MRI for further evaluation Last time patient had CT lumbar spine without contrast done in September that showed mild spondylosis throughout the lumbar spine with mid level disc disease.  Can attempt to reimage to see if there is any progression patient would likely benefit from PT OT  Decreased urine  output Will order bladder scan monitor for urinary retention   Other plan as per orders.  DVT prophylaxis:  xarelto     Code Status:  DNR/DNI   as per patient  I had personally discussed CODE STATUS with patient   ACP   none    Family Communication:   Family not at  Bedside    Diet diabetic heart healthy   Disposition Plan:    To home once workup is complete and patient is stable   Following barriers for discharge:  Chest pain work up is complete                            Electrolytes corrected                                                            Pain controlled with PO medications                                                           Will need consultants to evaluate patient prior to discharge                                                 Would benefit from PT/OT eval prior to DC  Ordered                   Consults called: cardiology is aware  Admission status:  ED Disposition     ED Disposition  Admit   Condition  --   Comment  Hospital Area: [facility] [[id number]]  Level of Care: Progressive [102]  Admit to Progressive based on following criteria: CARDIOVASCULAR & THORACIC of moderate stability with acute coronary syndrome symptoms/low risk myocardial infarction/hypertensive urgency/arrhythmias/heart failure potentially compromising stability and stable post cardiovascular intervention patients.  May place patient in observation at [facility] or [facility] if equivalent level of care is available:: No  Diagnosis: Chest pain [255200]  Admitting Physician: Normand Damron [[id number]]  Attending Physician: Lee Kalt [[id number]]          Obs    Level of care        progressive    COVID-19 Labs   Shamir Tuzzolino 06/13/2024, 1:58 AM    [facility]     after 2 AM please page floor coverage   If 7AM-7PM, please contact the day team taking care of the patient using [email or web address]        [1]   Allergies Allergen Reactions   Bee Venom Anaphylaxis   Trulicity  [Dulaglutide ] Anaphylaxis and Diarrhea    Extra dose of Trulicity  caused cardiac arrest. Patient experience onset of diarrhea with using this medication - cleared after med stopped.   "

## 2024-06-13 NOTE — Progress Notes (Signed)
 "  Rounding Note   Patient Name: [name] Date of Encounter: 06/13/2024  [facility] Cardiologist: Redell Shallow, MD   Subjective Still with right shoulder pain.  Complains of dyspnea.  Scheduled Meds:  insulin  aspart  0-9 Units Subcutaneous Q4H    morphine  injection  4 mg Intravenous Once   Continuous Infusions:  PRN Meds: HYDROmorphone  (DILAUDID ) injection   Vital Signs  Vitals:   06/12/24 2311 06/13/24 0256 06/13/24 0415 06/13/24 0700  BP: 120/79  130/80 131/84  Pulse: 95  84 79  Resp: 20  (!) 25 (!) 21  Temp: 98.4 F (36.9 C) 98.6 F (37 C) 98.9 F (37.2 C)   TempSrc:  Oral    SpO2: (!) 87%  91% 92%  Weight:      Height:       No intake or output data in the 24 hours ending 06/13/24 0719    06/12/2024    1:09 PM 05/23/2024    3:56 PM 04/25/2024    1:08 PM  Last 3 Weights  Weight (lbs) 269 lb 275 lb 6 oz 279 lb 3.2 oz  Weight (kg) 122.018 kg 124.909 kg 126.644 kg      Telemetry Sinus with rare PVC - Personally Reviewed   Physical Exam  GEN: No acute distress.   Neck: No JVD Cardiac: RRR Respiratory: Clear to auscultation bilaterally. GI: Soft, nontender, non-distended  MS: No edema; No deformity. Neuro:  Nonfocal  Psych: Normal affect   Labs  Recent Labs  Lab 06/12/24 1500 06/12/24 1556 06/13/24 0047 06/13/24 0505  TRNPT 31* 38* 39* 40*       Chemistry Recent Labs  Lab 06/12/24 1311 06/12/24 1556  NA  --  138  K  --  3.5  CL  --  93*  CO2  --  30  GLUCOSE  --  251*  BUN  --  40*  CREATININE  --  3.00*  CALCIUM   --  9.8  MG 1.7  --   PROT  --  8.1  ALBUMIN   --  4.4  AST  --  30  ALT  --  44  ALKPHOS  --  79  BILITOT  --  0.7  GFRNONAA  --  23*  ANIONGAP  --  15     Hematology Recent Labs  Lab 06/12/24 1311  WBC 9.8  RBC 4.68  HGB 14.2  HCT 41.9  MCV 89.5  MCH 30.3  MCHC 33.9  RDW 14.8  PLT 281    BNP Recent Labs  Lab 06/12/24 1311  PROBNP 226.0      Radiology  CT LUMBAR SPINE WO  CONTRAST Result Date: 06/13/2024 EXAM: CT OF THE LUMBAR SPINE WITHOUT CONTRAST 06/13/2024 02:26:05 AM TECHNIQUE: CT of the lumbar spine was performed without the administration of intravenous contrast. Multiplanar reformatted images are provided for review. Automated exposure control, iterative reconstruction, and/or weight based adjustment of the mA/kV was utilized to reduce the radiation dose to as low as reasonably achievable. COMPARISON: Lumbar spine CT 02/28/2024. CLINICAL HISTORY: Lumbar radiculopathy, symptoms persist with > 6 wks treatment. FINDINGS: BONES AND ALIGNMENT: Normal vertebral body heights. No acute fracture or suspicious bone lesion. Normal alignment. There is normal bone mineralization. There are 5 lumbar segments. There is mild chronic anterior wedging of the T12 and L1 vertebral bodies, stable. L2 through L5 vertebral bodies are normal in height. Moderate lumbar spondylosis is again noted. DEGENERATIVE CHANGES: At T11-T12, there is a slight diffuse disc bulge.  There is mild disc space loss with Schmorl's nodes. No herniation or stenosis. At T12-L1, there is moderate disc space loss with vacuum phenomenon. There is a small calcified central disc extrusion which is nonstenosing. There is a mild diffuse annular bulge without herniation or stenosis. At L1-L2, there is moderate disc space loss, Schmorl's nodes, and vacuum phenomenon. There is a mild diffuse annular bulge, dorsal ligamentous thickening, and mild facet joint spurring, in combination causing mild spinal canal stenosis. There is mild foraminal stenosis. At L2-L3, there is moderate disc space loss with vacuum phenomenon. Due to moderate diffuse disc bulge, ligamentous thickening, and facet spurring, there is mild to moderate spinal canal stenosis. Due to facet spurs and inferior foraminal disc bulging, there is bilateral moderate foraminal stenosis. At L3-L4, the disc is normal in height. There is a mild concentric disc bulge causing  mild spinal canal stenosis and partial effacement of the subarticular zones. There is mild ligamentous thickening and bilateral facet joint spurring with mild left foraminal stenosis. At L4-L5, there is normal disc height, moderate facet hypertrophy, ligamentous thickening, and diffuse disc bulge causing mild to moderate spinal canal stenosis with effacement of the subarticular zones. There is bilateral mild to moderate foraminal stenosis. At L5-S1, the disc is normal in height. There is minimal broad based disc bulge without herniation or canal stenosis. There is moderate right and mild left facet hypertrophy and bilateral mild foraminal stenosis. SOFT TISSUES: There is scattered calcific plaque in the abdominal aorta. Spurring at the SI joints with mild nonerosive sacroiliitis. No paraspinal mass or fluid collections. IMPRESSION: 1. Degenerative changes with mild to moderate spinal canal stenosis at L2-L3 and L4-L5. 2. Moderate bilateral foraminal stenosis at L2-L3 and mild to moderate bilateral foraminal stenosis at L4-L5. 3. No evidence of fractures or malalignment , with additional detailed degenerative findings discussed above. 4. Aortic atherosclerosis. 5. Nonerosive sacroiliitis. Electronically signed by: Francis Quam MD 06/13/2024 03:14 AM EST RP Workstation: [id number]   CT SHOULDER RIGHT WO CONTRAST Result Date: 06/13/2024 EXAM: CT RIGHT SHOULDER, WITHOUT IV CONTRAST 06/13/2024 02:26:05 AM TECHNIQUE: Axial images were acquired through the right shoulder without IV contrast. Reformatted images were reviewed. Automated exposure control, iterative reconstruction, and/or weight based adjustment of the mA/kV was utilized to reduce the radiation dose to as low as reasonably achievable. COMPARISON: Comparison is made with yesterday's plain film series. CLINICAL HISTORY: Shoulder pain, chronic, bursitis suspected, xray done. FINDINGS: BONES: The bone mineralization is normal. There is no evidence of fracture,  dislocation, or malalignment. There is narrowing and spurring with acromial subcortical degenerative cystic change at the [facility] joint, and an accessory os acromiale. There is near complete mid to lower glenohumeral joint space loss with inferior osteophytes. JOINTS: No dislocation. There is near complete mid to lower glenohumeral joint space loss with inferior osteophytes. There is narrowing and spurring with acromial subcortical degenerative cystic change at the [facility] joint. There is no substantial joint effusion. SOFT TISSUES: There is a small calcification in the conjoined portion of the supraspinatus/infraspinatus tendon complex compatible with calcific tendinopathy. Rotator cuff muscles are normal in bulk. The deltoid and visualized pectoralis musculature are unremarkable. The trapezius muscle appears normal. The rotator cuff tendons are not well evaluated, but no through-and-through tears are suspected. No fluid collections or masses are identified without contrast. There is partial visualization of bipolar pacemaker wiring. LUNGS: There are mild paraseptal and centrilobular emphysematous changes in the right upper lobe. IMPRESSION: 1. Near complete mid to lower glenohumeral joint space loss  with inferior osteophytes, consistent with advanced osteoarthritis. 2. Small calcification in the conjoined portion of the supraspinatus/infraspinatus tendon complex, consistent with calcific tendinopathy. 3. Accessory os acromiale. 4. Rotator cuff tendons are not well evaluated, but no through-and-through tears are suspected. 5. Pulmonary emphysema is an independent risk factor for lung cancer. Recommend consideration for evaluation for a low-dose CT lung cancer screening program. Electronically signed by: Francis Quam MD 06/13/2024 02:50 AM EST RP Workstation: [id number]   DG Shoulder Right Result Date: 06/13/2024 EXAM: 1 VIEW(S) XRAY OF THE RIGHT SHOULDER 06/13/2024 12:03:13 AM COMPARISON: Comparison with 02/25/2024.  CLINICAL HISTORY: R shoulder pain. FINDINGS: BONES AND JOINTS: Degenerative changes in the glenohumeral joint. No evidence of acute fracture or dislocation. No malalignment. No focal bone lesion or bone destruction. Bone cortex appears intact. Acromioclavicular and coracoclavicular spaces are normal. SOFT TISSUES: Soft tissues are unremarkable. No abnormal calcifications. Visualized lung is unremarkable. IMPRESSION: 1. No acute fracture or dislocation. 2. Degenerative changes in the glenohumeral joint. Electronically signed by: Elsie Gravely MD 06/13/2024 12:08 AM EST RP Workstation: [id number]   CT Chest Wo Contrast Result Date: 06/12/2024 EXAM: CT CHEST WITHOUT CONTRAST 06/12/2024 06:45:03 PM TECHNIQUE: CT of the chest was performed without the administration of intravenous contrast. Multiplanar reformatted images are provided for review. Automated exposure control, iterative reconstruction, and/or weight based adjustment of the mA/kV was utilized to reduce the radiation dose to as low as reasonably achievable. COMPARISON: 03/16/2024. CLINICAL HISTORY: chest pain, shortness of breath, radiating to L back and R shoulder. FINDINGS: MEDIASTINUM: Mild cardiomegaly. Pacer wires in the right heart. The central airways are clear. LYMPH NODES: No mediastinal, hilar or axillary lymphadenopathy. LUNGS AND PLEURA: Atelectasis in the left lower lobe adjacent to the heart. No pulmonary edema. No pleural effusion or pneumothorax. SOFT TISSUES/BONES: No acute abnormality of the bones or soft tissues. UPPER ABDOMEN: Limited images of the upper abdomen demonstrates no acute abnormality. IMPRESSION: 1. No acute findings. 2. Left lower lobe atelectasis adjacent to the heart. 3. Mild cardiomegaly. Electronically signed by: Franky Crease MD 06/12/2024 07:03 PM EST RP Workstation: [id number]   DG Chest Portable 1 View Result Date: 06/12/2024 CLINICAL DATA:  Chest pain EXAM: PORTABLE CHEST 1 VIEW COMPARISON:  March 16, 2024  FINDINGS: Evaluation is limited by positioning. The cardiomediastinal silhouette is unchanged and enlarged in contour.LEFT chest AICD. No pleural effusion. No pneumothorax. LEFT retrocardiac opacity, likely atelectasis. Perihilar vascular fullness without overt edema. IMPRESSION: Perihilar vascular fullness without overt edema. Electronically Signed   By: Corean Salter M.D.   On: 06/12/2024 13:47    Patient Profile   59 y.o. male with past medical history of nonischemic cardiomyopathy, history of ICD, chronic stage IIIb kidney disease, diabetes mellitus, obstructive sleep apnea being evaluated for chest pain.  Cardiac catheterization December 2023 showed no obstructive coronary disease with low pre and postcapillary filling pressures.  Last echocardiogram October 2025 showed ejection fraction 45 to 50%, mild left ventricular hypertrophy and mildly dilated aortic root at 42 mm.  Assessment & Plan  1 chest pain-symptoms are atypical.  Pain is in the right shoulder and increases with certain movements.  It has been continuous for 4 days.  Troponins minimally elevated but not consistent with acute coronary syndrome.  Would not pursue further ischemia evaluation.  2 nonischemic cardiomyopathy-patient is not on Entresto  or spironolactone  given chronic kidney disease.  Also not on SGLT2 inhibitor as he has a history of groin cellulitis.  Beta-blocker held previously secondary to fatigue.  Will add low-dose Toprol   12.5 mg daily and follow.  3 chronic systolic congestive heart failure-he appears to be euvolemic.  Will continue torsemide  and metolazone  at preadmission dose.  4 history of ventricular tachycardia-continue amiodarone .  ICD in place.  5 chronic stage IV kidney disease-creatinine similar to previous values.  Can be followed as an outpatient.  6 history of recurrent DVT-continue Xarelto .  Patient can be discharged from a cardiac standpoint.  Would continue preadmission medications.  Will  have ICD interrogated prior to discharge.  For questions or updates, please contact [facility] Please consult [email or web address] for contact info under   Signed, Redell Shallow, MD  06/13/2024, 7:19 AM    "

## 2024-06-13 NOTE — ED Notes (Signed)
 Pt requesting his PRN pain medicine for 10/10 pain in his shoulder.

## 2024-06-13 NOTE — Assessment & Plan Note (Signed)
 Will order bladder scan monitor for urinary retention

## 2024-06-13 NOTE — Progress Notes (Signed)
" °   06/13/24 2002  BiPAP/CPAP/SIPAP  Reason BIPAP/CPAP not in use Non-compliant (Pt states he does not wear one at home and does not wish to wear a CPAP here.)    "

## 2024-06-13 NOTE — Assessment & Plan Note (Addendum)
 Reports acute on chronic back pain has been having persistent back pain but it has gotten worse over the past few days.  Patient states that he will not tolerate MRI for further evaluation Last time patient had CT lumbar spine without contrast done in September that showed mild spondylosis throughout the lumbar spine with mid level disc disease.  Can attempt to reimage to see if there is any progression patient would likely benefit from PT OT

## 2024-06-13 NOTE — Plan of Care (Signed)
  Problem: Education: Goal: Individualized Educational Video(s) Outcome: Progressing   Problem: Coping: Goal: Ability to adjust to condition or change in health will improve Outcome: Progressing   Problem: Fluid Volume: Goal: Ability to maintain a balanced intake and output will improve Outcome: Progressing

## 2024-06-13 NOTE — Progress Notes (Signed)
 PT Cancellation Note  Patient Details Name: [name] MRN: [id number] DOB: 1965/09/26   Cancelled Treatment:    Reason Eval/Treat Not Completed: Patient declined, no reason specified (Pt states Not today and that he is in too much pain. Requests PT to come back tomorrow.)   Jonnae Fonseca 06/13/2024, 3:43 PM

## 2024-06-13 NOTE — Telephone Encounter (Signed)
 Med has been discontinued, needs office visit. Nothing further needed.   Key : [id number]

## 2024-06-13 NOTE — Assessment & Plan Note (Signed)
 Atypical Most likely related to shoulder pain. Supportive management Noted troponin elevated but stable appreciate cardiology involvement echo in the morning

## 2024-06-13 NOTE — Assessment & Plan Note (Signed)
 Chronic stable continue home medications ?

## 2024-06-13 NOTE — Assessment & Plan Note (Signed)
 ICD has been interrogated as per cardiology recommendations await results of interrogation.  Patient did at some point endorse that he felt somewhat lightheaded currently back to baseline monitor on telemetry for any evidence of V. tach V-fib

## 2024-06-13 NOTE — Plan of Care (Signed)
 Patient ID: ALFONSO CARDEN, male   DOB: 07/02/1965, 59 y.o.   MRN: [id number]  Problem: Education: Goal: Ability to describe self-care measures that may prevent or decrease complications (Diabetes Survival Skills Education) will improve Outcome: Progressing Goal: Individualized Educational Video(s) Outcome: Progressing   Problem: Coping: Goal: Ability to adjust to condition or change in health will improve Outcome: Progressing   Problem: Fluid Volume: Goal: Ability to maintain a balanced intake and output will improve Outcome: Progressing   Problem: Health Behavior/Discharge Planning: Goal: Ability to identify and utilize available resources and services will improve Outcome: Progressing Goal: Ability to manage health-related needs will improve Outcome: Progressing   Problem: Metabolic: Goal: Ability to maintain appropriate glucose levels will improve Outcome: Progressing   Problem: Nutritional: Goal: Maintenance of adequate nutrition will improve Outcome: Progressing Goal: Progress toward achieving an optimal weight will improve Outcome: Progressing   Problem: Skin Integrity: Goal: Risk for impaired skin integrity will decrease Outcome: Progressing   Problem: Tissue Perfusion: Goal: Adequacy of tissue perfusion will improve Outcome: Progressing   Problem: Education: Goal: Ability to demonstrate management of disease process will improve Outcome: Progressing Goal: Ability to verbalize understanding of medication therapies will improve Outcome: Progressing Goal: Individualized Educational Video(s) Outcome: Progressing   Problem: Activity: Goal: Capacity to carry out activities will improve Outcome: Progressing   Problem: Cardiac: Goal: Ability to achieve and maintain adequate cardiopulmonary perfusion will improve Outcome: Progressing   Problem: Education: Goal: Knowledge of General Education information will improve Description: Including pain rating scale,  medication(s)/side effects and non-pharmacologic comfort measures Outcome: Progressing   Problem: Health Behavior/Discharge Planning: Goal: Ability to manage health-related needs will improve Outcome: Progressing   Problem: Clinical Measurements: Goal: Ability to maintain clinical measurements within normal limits will improve Outcome: Progressing Goal: Will remain free from infection Outcome: Progressing Goal: Diagnostic test results will improve Outcome: Progressing Goal: Respiratory complications will improve Outcome: Progressing Goal: Cardiovascular complication will be avoided Outcome: Progressing   Problem: Activity: Goal: Risk for activity intolerance will decrease Outcome: Progressing   Problem: Nutrition: Goal: Adequate nutrition will be maintained Outcome: Progressing   Problem: Coping: Goal: Level of anxiety will decrease Outcome: Progressing   Problem: Elimination: Goal: Will not experience complications related to bowel motility Outcome: Progressing Goal: Will not experience complications related to urinary retention Outcome: Progressing   Problem: Pain Managment: Goal: General experience of comfort will improve and/or be controlled Outcome: Progressing   Problem: Safety: Goal: Ability to remain free from injury will improve Outcome: Progressing   Problem: Skin Integrity: Goal: Risk for impaired skin integrity will decrease Outcome: Progressing   Verdie JONETTA Collier, RN

## 2024-06-13 NOTE — Assessment & Plan Note (Signed)
 On Xarelto  continue

## 2024-06-13 NOTE — Progress Notes (Signed)
 Brief same day note:  Patient is a 59 year old male with history of systolic CHF, ICD placement, V. tach, V-fib, OSA not on CPAP, diabetes type 2, GERD, hypertension who presented with chest pain, severe right shoulder pain, back pain. Patient has been seen by cardiology, plan for ICD interrogation.  Patient has severe pain of the right shoulder.  CT of the right shoulder shows advanced glenohumeral osteoarthritis.  CT of the back shows chronic changes, degenerative.  Cardiology already following.  Plan for ICD interrogation. Continues to complain of severe right shoulder and right shoulder is very tender.  I have requested orthopedics evaluation.  PT consulted. I agree with assessment and plan of Dr. Silvester

## 2024-06-13 NOTE — Subjective & Objective (Signed)
 Comes in from home with chest pain for the past 4 days shortness of breath with exertion Patient has known history of systolic CHF and left heart cath with normal anatomy had ICD placed  Patient has been having chest/shoulder pain for the past 4 days it is radiating down to his right arm to his elbow and also radiating to his left neck coming and going switching from right to left lasting only few seconds at a time it seems worse with palpation prior to pain he has been seeing blue with slow motion vision which is what he experienced before prior to passing out no other symptoms  In ER trop 31-38 Seen by cardiology in the emergency department felt the chest pain is noncardiac given history Radiologist recommended automatic device interrogation to ensure there was no recent V. tach VF And observe overnight given presyncope like prodrome episodes

## 2024-06-13 NOTE — Assessment & Plan Note (Signed)
 Order sliding scale continue insulin  Lantus  decreased down to 15 units while have had decreased p.o. intake

## 2024-06-13 NOTE — Assessment & Plan Note (Signed)
 Unclear etiology no redness or swelling noted at this time although gout cannot be excluded.  Plain images unremarkable very tender to palpation. Patient states that he will not tolerate MRI Pain management for tonight consider orthopedics consult in a.m.

## 2024-06-14 DIAGNOSIS — I5022 Chronic systolic (congestive) heart failure: Secondary | ICD-10-CM | POA: Diagnosis present

## 2024-06-14 DIAGNOSIS — I77810 Thoracic aortic ectasia: Secondary | ICD-10-CM | POA: Diagnosis present

## 2024-06-14 DIAGNOSIS — E872 Acidosis, unspecified: Secondary | ICD-10-CM | POA: Diagnosis present

## 2024-06-14 DIAGNOSIS — I130 Hypertensive heart and chronic kidney disease with heart failure and stage 1 through stage 4 chronic kidney disease, or unspecified chronic kidney disease: Secondary | ICD-10-CM | POA: Diagnosis present

## 2024-06-14 DIAGNOSIS — Z7901 Long term (current) use of anticoagulants: Secondary | ICD-10-CM | POA: Diagnosis not present

## 2024-06-14 DIAGNOSIS — E1122 Type 2 diabetes mellitus with diabetic chronic kidney disease: Secondary | ICD-10-CM | POA: Diagnosis present

## 2024-06-14 DIAGNOSIS — R0789 Other chest pain: Secondary | ICD-10-CM

## 2024-06-14 DIAGNOSIS — F419 Anxiety disorder, unspecified: Secondary | ICD-10-CM | POA: Diagnosis present

## 2024-06-14 DIAGNOSIS — R079 Chest pain, unspecified: Secondary | ICD-10-CM | POA: Diagnosis present

## 2024-06-14 DIAGNOSIS — I252 Old myocardial infarction: Secondary | ICD-10-CM | POA: Diagnosis not present

## 2024-06-14 DIAGNOSIS — F32A Depression, unspecified: Secondary | ICD-10-CM | POA: Diagnosis present

## 2024-06-14 DIAGNOSIS — I4729 Other ventricular tachycardia: Secondary | ICD-10-CM | POA: Diagnosis present

## 2024-06-14 DIAGNOSIS — K589 Irritable bowel syndrome without diarrhea: Secondary | ICD-10-CM | POA: Diagnosis present

## 2024-06-14 DIAGNOSIS — E1142 Type 2 diabetes mellitus with diabetic polyneuropathy: Secondary | ICD-10-CM | POA: Diagnosis present

## 2024-06-14 DIAGNOSIS — G4733 Obstructive sleep apnea (adult) (pediatric): Secondary | ICD-10-CM | POA: Diagnosis present

## 2024-06-14 DIAGNOSIS — N138 Other obstructive and reflux uropathy: Secondary | ICD-10-CM | POA: Diagnosis present

## 2024-06-14 DIAGNOSIS — N179 Acute kidney failure, unspecified: Secondary | ICD-10-CM | POA: Diagnosis not present

## 2024-06-14 DIAGNOSIS — N1832 Chronic kidney disease, stage 3b: Secondary | ICD-10-CM | POA: Diagnosis present

## 2024-06-14 DIAGNOSIS — I428 Other cardiomyopathies: Secondary | ICD-10-CM | POA: Diagnosis present

## 2024-06-14 DIAGNOSIS — K219 Gastro-esophageal reflux disease without esophagitis: Secondary | ICD-10-CM | POA: Diagnosis present

## 2024-06-14 DIAGNOSIS — Z66 Do not resuscitate: Secondary | ICD-10-CM | POA: Diagnosis present

## 2024-06-14 DIAGNOSIS — E669 Obesity, unspecified: Secondary | ICD-10-CM | POA: Diagnosis present

## 2024-06-14 DIAGNOSIS — Z794 Long term (current) use of insulin: Secondary | ICD-10-CM | POA: Diagnosis not present

## 2024-06-14 DIAGNOSIS — E1169 Type 2 diabetes mellitus with other specified complication: Secondary | ICD-10-CM | POA: Diagnosis present

## 2024-06-14 DIAGNOSIS — J4489 Other specified chronic obstructive pulmonary disease: Secondary | ICD-10-CM | POA: Diagnosis present

## 2024-06-14 LAB — COMPREHENSIVE METABOLIC PANEL WITH GFR
ALT: 45 U/L — ABNORMAL HIGH (ref 0–44)
AST: 27 U/L (ref 15–41)
Albumin: 4 g/dL (ref 3.5–5.0)
Alkaline Phosphatase: 88 U/L (ref 38–126)
Anion gap: 13 (ref 5–15)
BUN: 49 mg/dL — ABNORMAL HIGH (ref 6–20)
CO2: 32 mmol/L (ref 22–32)
Calcium: 9.4 mg/dL (ref 8.9–10.3)
Chloride: 92 mmol/L — ABNORMAL LOW (ref 98–111)
Creatinine, Ser: 3.12 mg/dL — ABNORMAL HIGH (ref 0.61–1.24)
GFR, Estimated: 22 mL/min — ABNORMAL LOW
Glucose, Bld: 200 mg/dL — ABNORMAL HIGH (ref 70–99)
Potassium: 3.4 mmol/L — ABNORMAL LOW (ref 3.5–5.1)
Sodium: 137 mmol/L (ref 135–145)
Total Bilirubin: 0.6 mg/dL (ref 0.0–1.2)
Total Protein: 7.4 g/dL (ref 6.5–8.1)

## 2024-06-14 LAB — CBC
HCT: 37.7 % — ABNORMAL LOW (ref 39.0–52.0)
Hemoglobin: 12.5 g/dL — ABNORMAL LOW (ref 13.0–17.0)
MCH: 29.7 pg (ref 26.0–34.0)
MCHC: 33.2 g/dL (ref 30.0–36.0)
MCV: 89.5 fL (ref 80.0–100.0)
Platelets: 240 K/uL (ref 150–400)
RBC: 4.21 MIL/uL — ABNORMAL LOW (ref 4.22–5.81)
RDW: 14.6 % (ref 11.5–15.5)
WBC: 9.2 K/uL (ref 4.0–10.5)
nRBC: 0 % (ref 0.0–0.2)

## 2024-06-14 LAB — GLUCOSE, CAPILLARY
Glucose-Capillary: 130 mg/dL — ABNORMAL HIGH (ref 70–99)
Glucose-Capillary: 173 mg/dL — ABNORMAL HIGH (ref 70–99)
Glucose-Capillary: 314 mg/dL — ABNORMAL HIGH (ref 70–99)
Glucose-Capillary: 318 mg/dL — ABNORMAL HIGH (ref 70–99)
Glucose-Capillary: 334 mg/dL — ABNORMAL HIGH (ref 70–99)
Glucose-Capillary: 414 mg/dL — ABNORMAL HIGH (ref 70–99)
Glucose-Capillary: 420 mg/dL — ABNORMAL HIGH (ref 70–99)
Glucose-Capillary: 476 mg/dL — ABNORMAL HIGH (ref 70–99)

## 2024-06-14 LAB — MAGNESIUM: Magnesium: 2.1 mg/dL (ref 1.7–2.4)

## 2024-06-14 LAB — PRO BRAIN NATRIURETIC PEPTIDE: Pro Brain Natriuretic Peptide: 370 pg/mL — ABNORMAL HIGH

## 2024-06-14 LAB — PHOSPHORUS: Phosphorus: 3 mg/dL (ref 2.5–4.6)

## 2024-06-14 MED ORDER — INSULIN ASPART 100 UNIT/ML IJ SOLN
0.0000 [IU] | Freq: Every day | INTRAMUSCULAR | Status: DC
  Administered 2024-06-15 – 2024-06-16 (×2): 5 [IU] via SUBCUTANEOUS
  Filled 2024-06-14 (×2): qty 5

## 2024-06-14 MED ORDER — SENNOSIDES-DOCUSATE SODIUM 8.6-50 MG PO TABS
1.0000 | ORAL_TABLET | Freq: Two times a day (BID) | ORAL | Status: DC
  Administered 2024-06-14 – 2024-06-17 (×6): 1 via ORAL
  Filled 2024-06-14 (×6): qty 1

## 2024-06-14 MED ORDER — LACTATED RINGERS IV BOLUS
500.0000 mL | INTRAVENOUS | Status: AC
  Administered 2024-06-14: 500 mL via INTRAVENOUS

## 2024-06-14 MED ORDER — INSULIN ASPART 100 UNIT/ML IJ SOLN
0.0000 [IU] | Freq: Three times a day (TID) | INTRAMUSCULAR | Status: DC
  Administered 2024-06-14: 15 [IU] via SUBCUTANEOUS
  Administered 2024-06-15: 5 [IU] via SUBCUTANEOUS
  Administered 2024-06-15: 15 [IU] via SUBCUTANEOUS
  Administered 2024-06-15: 8 [IU] via SUBCUTANEOUS
  Administered 2024-06-16: 2 [IU] via SUBCUTANEOUS
  Administered 2024-06-16: 8 [IU] via SUBCUTANEOUS
  Administered 2024-06-16: 11 [IU] via SUBCUTANEOUS
  Administered 2024-06-17: 3 [IU] via SUBCUTANEOUS
  Filled 2024-06-14: qty 5
  Filled 2024-06-14: qty 8
  Filled 2024-06-14: qty 2
  Filled 2024-06-14: qty 15
  Filled 2024-06-14: qty 7
  Filled 2024-06-14: qty 8
  Filled 2024-06-14: qty 15
  Filled 2024-06-14: qty 3
  Filled 2024-06-14: qty 11

## 2024-06-14 MED ORDER — POTASSIUM CHLORIDE CRYS ER 20 MEQ PO TBCR
20.0000 meq | EXTENDED_RELEASE_TABLET | Freq: Once | ORAL | Status: AC
  Administered 2024-06-14: 20 meq via ORAL
  Filled 2024-06-14: qty 1

## 2024-06-14 MED ORDER — FUROSEMIDE 10 MG/ML IJ SOLN
80.0000 mg | Freq: Two times a day (BID) | INTRAMUSCULAR | Status: DC
  Administered 2024-06-14: 80 mg via INTRAVENOUS
  Filled 2024-06-14: qty 8

## 2024-06-14 MED ORDER — INSULIN GLARGINE-YFGN 100 UNIT/ML ~~LOC~~ SOLN
30.0000 [IU] | Freq: Every day | SUBCUTANEOUS | Status: DC
  Administered 2024-06-14 – 2024-06-15 (×2): 30 [IU] via SUBCUTANEOUS
  Filled 2024-06-14 (×3): qty 0.3

## 2024-06-14 MED ORDER — INSULIN ASPART 100 UNIT/ML IJ SOLN
10.0000 [IU] | Freq: Three times a day (TID) | INTRAMUSCULAR | Status: DC
  Administered 2024-06-14 – 2024-06-16 (×6): 10 [IU] via SUBCUTANEOUS
  Filled 2024-06-14 (×5): qty 10

## 2024-06-14 MED ORDER — POLYETHYLENE GLYCOL 3350 17 G PO PACK
17.0000 g | PACK | Freq: Every day | ORAL | Status: DC
  Administered 2024-06-15 – 2024-06-17 (×3): 17 g via ORAL
  Filled 2024-06-14 (×3): qty 1

## 2024-06-14 MED ORDER — INSULIN ASPART 100 UNIT/ML IJ SOLN
7.0000 [IU] | INTRAMUSCULAR | Status: AC
  Administered 2024-06-14: 7 [IU] via SUBCUTANEOUS

## 2024-06-14 NOTE — TOC Progression Note (Addendum)
 Transition of Care [facility]) - Progression Note    Patient Details  Name: [name] MRN: [id number] Date of Birth: 1965-11-30  Transition [facility]) CM/SW Contact  Waddell Barnie Rama, RN Phone Number: 06/14/2024, 2:41 PM  Clinical Narrative:    NCM spoke with patient,offered choice for HHPT, he states he does not want HHPT right now, he is in the process of moving and his home is not quite ready for that to happen.  He states he will go thru his PCP to set up HHPT in two weeks for him.  He states he will not be able to do OP PT.  Also he will need home oxygen .  He states he does not have a preference of the agency.  NCM made referral to [employer].   1/7- sent to CMA for PCP follow up apt to be made.                     Expected Discharge Plan and Services                                               Social Drivers of Health (SDOH) Interventions SDOH Screenings   Food Insecurity: No Food Insecurity (06/13/2024)  Housing: Low Risk (06/13/2024)  Transportation Needs: No Transportation Needs (06/13/2024)  Utilities: At Risk (06/13/2024)  Alcohol Screen: Low Risk (05/25/2023)  Depression (PHQ2-9): Low Risk (05/18/2024)  Financial Resource Strain: Low Risk (05/25/2023)  Recent Concern: Financial Resource Strain - High Risk (03/24/2023)  Physical Activity: Inactive (05/25/2023)  Social Connections: Moderately Isolated (03/17/2024)  Stress: Stress Concern Present (05/25/2023)  Tobacco Use: Medium Risk (06/12/2024)  Health Literacy: Adequate Health Literacy (05/25/2023)    Readmission Risk Interventions    03/23/2024    4:27 PM 12/21/2023    4:08 PM  Readmission Risk Prevention Plan  Transportation Screening Complete Complete  Medication Review (RN Care Manager) Complete Complete  PCP or Specialist appointment within 3-5 days of discharge Complete Complete  HRI or Home Care Consult Complete Complete  Palliative Care Screening Not Applicable Not Applicable   Skilled Nursing Facility Not Applicable Not Applicable

## 2024-06-14 NOTE — Progress Notes (Signed)
 "  PROGRESS NOTE  [name]  FMW:[id number] DOB: 1965-07-20 DOA: 06/12/2024 PCP: [name] Barnie NOVAK, MD   Brief Narrative: Patient is a 59 year old male with history of systolic CHF, ICD placement, V. tach, V-fib, OSA not on CPAP, diabetes type 2, GERD, hypertension who presented with chest pain, severe right shoulder pain, back pain.  Cardiology was consulted on admission.  ICD interrogated, no finding of any abnormalities.  Hospital course remarkable for AKI, worsening back pain, right shoulder pain.  Right shoulder CT showed advanced glenohumeral osteoarthritis, orthopedics recommended outpatient follow-up.  Started on high-dose Lasix  today for AKI.  PT evaluation pending  Assessment & Plan:  Principal Problem:   Chest pain radiating to arm Active Problems:   Chronic systolic congestive heart failure (HCC)   Asthma-COPD overlap syndrome (HCC)   Stage 3b chronic [name] disease (HCC)   Type 2 diabetes mellitus with hyperlipidemia (HCC)   ICD (implantable cardioverter-defibrillator) in place   Lumbar back pain   BPH with obstruction/lower urinary tract symptoms   Acute deep vein thrombosis (DVT) of left upper extremity (HCC)   Shoulder pain   Decreased urine output   Chest pain: Likely atypical chest pain.  Cardiology cleared for discharge.  ICD interrogated without finding of any events.  Severe right shoulder pain/back pain: Chronic problem.  Right shoulder CT showed advanced glenohumeral osteoarthritis.  I discussed with orthopedics PA, [name], who does not recommend any intervention.  I recommend a short trial of steroids and outpatient follow-up with [name].  PT consulted for back pain.  Continue pain management.  Lidocaine  patch ordered for right shoulder.  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  Creatinine slightly up from baseline.  Follows with [name], [place] [name].  Takes torsemide  at home.  Currently on IV Lasix   History of V. tach: ICD  in place.  No abnormal information as per ICD interrogation  Chronic systolic congestive heart failure: Appeared mildly volume overloaded, elevated BNP.  Continue IV Lasix  for now.  Asthma/COPD: Not on oxygen  at home.  Currently on 2 L of oxygen  per minute.  Trying to wean the oxygen .  Continue diuresis  DVT of left upper extremity: On Xarelto   Type 2 diabetes: Continue current insulin  regimen.  Monitor blood sugars  History of BPH: Continue Flomax   Goals of care: Young patient with multiple comorbidities.  CODE STATUS DNR.          DVT prophylaxis:SCDs Start: 06/13/24 0840 rivaroxaban  (XARELTO ) tablet 20 mg     Code Status: Limited: Do not attempt resuscitation (DNR) -DNR-LIMITED -Do Not Intubate/DNI   Family Communication: None at the bedside  Patient status:Inpatient  Patient is from :home  Anticipated discharge un:ynfz  Estimated DC date:1-2 days   Consultants: Cardiology  Procedures: None  Antimicrobials:  Anti-infectives (From admission, onward)    None       Subjective: Patient seen and examined at bedside today.  Hemodynamically stable.  Lying on bed.  On 2 L of oxygen  per minute.  Right shoulder pain better.  His back pain is the main issue now.  I have requested him to work with the physical therapist today.  Denied any worsening shortness of breath or chest pain today.  Objective: Vitals:   06/14/24 0023 06/14/24 0024 06/14/24 0422 06/14/24 0717  BP:  130/77 130/84 124/86  Pulse: 77 76 77 72  Resp:  16 18 20   Temp:  97.6 F (36.4 C) 97.8 F (36.6 C) 98.1 F (36.7 C)  TempSrc:  Oral Oral Oral  SpO2: 96% 93% 92% 94%  Weight:   124.4 kg   Height:        Intake/Output Summary (Last 24 hours) at 06/14/2024 1130 Last data filed at 06/14/2024 9178 Gross per 24 hour  Intake 716 ml  Output 500 ml  Net 216 ml   Filed Weights   06/12/24 1309 06/13/24 1454 06/14/24 0422  Weight: 122 kg 123.8 kg 124.4 kg    Examination:  General exam: Looks  weak and deconditioned HEENT: PERRL Respiratory system:  no wheezes or crackles  Cardiovascular system: S1 & S2 heard, RRR.  Gastrointestinal system: Abdomen is nondistended, soft and nontender. Central nervous system: Alert and oriented Extremities: No edema, no clubbing ,no cyanosis Skin: No rashes, no ulcers,no icterus     Data Reviewed: I have personally reviewed following labs and imaging studies  CBC: Recent Labs  Lab 06/12/24 1311 06/14/24 0310  WBC 9.8 9.2  NEUTROABS 7.1  --   HGB 14.2 12.5*  HCT 41.9 37.7*  MCV 89.5 89.5  PLT 281 240   Basic Metabolic Panel: Recent Labs  Lab 06/12/24 1311 06/12/24 1556 06/14/24 0310  NA  --  138 137  K  --  3.5 3.4*  CL  --  93* 92*  CO2  --  30 32  GLUCOSE  --  251* 200*  BUN  --  40* 49*  CREATININE  --  3.00* 3.12*  CALCIUM   --  9.8 9.4  MG 1.7  --  2.1  PHOS  --   --  3.0     No results found for this or any previous visit (from the past 240 hours).   Radiology Studies: ECHOCARDIOGRAM COMPLETE Result Date: 06/13/2024    ECHOCARDIOGRAM REPORT   Patient Name:   [name] Date of Exam: 06/13/2024 Medical Rec #:  [id number]      Height:       73.0 in Accession #:    [id number]     Weight:       269.0 lb Date of Birth:  14-Nov-1965      BSA:          2.440 m Patient Age:    58 years       BP:           0/0 mmHg Patient Gender: M              HR:           82 bpm. Exam Location:  Inpatient Procedure: 2D Echo, Cardiac Doppler, Color Doppler and Intracardiac            Opacification Agent (Both Spectral and Color Flow Doppler were            utilized during procedure). Indications:    CHF-acute systolic 150.21  History:        Patient has prior history of Echocardiogram examinations, most                 recent 03/17/2024. CHF, Defibrillator, COPD; Risk                 Factors:Diabetes.  Sonographer:    Merlynn Argyle Referring Phys: [id number] [name]  Sonographer Comments: Technically difficult study due to poor echo windows.  Image acquisition challenging due to patient body habitus and Image acquisition challenging due to respiratory motion. IMPRESSIONS  1. Definity  contrast used. Left ventricular ejection fraction, by estimation, is 45 to 50%. The left  ventricle has mildly decreased function. Left ventricular endocardial border not optimally defined to evaluate regional wall motion. The left ventricular internal cavity size was moderately dilated. Left ventricular diastolic parameters are consistent with Grade I diastolic dysfunction (impaired relaxation).  2. Right ventricular systolic function is normal. The right ventricular size is normal.  3. The mitral valve is normal in structure. No evidence of mitral valve regurgitation. No evidence of mitral stenosis.  4. The aortic valve is normal in structure. Aortic valve regurgitation is not visualized. No aortic stenosis is present.  5. The inferior vena cava is normal in size with greater than 50% respiratory variability, suggesting right atrial pressure of 3 mmHg. FINDINGS  Left Ventricle: Definity  contrast used. Left ventricular ejection fraction, by estimation, is 45 to 50%. The left ventricle has mildly decreased function. Left ventricular endocardial border not optimally defined to evaluate regional wall motion. Definity  contrast agent was given IV to delineate the left ventricular endocardial borders. The left ventricular internal cavity size was moderately dilated. There is no left ventricular hypertrophy. Left ventricular diastolic parameters are consistent with Grade I diastolic dysfunction (impaired relaxation). Right Ventricle: The right ventricular size is normal. No increase in right ventricular wall thickness. Right ventricular systolic function is normal. Left Atrium: Left atrial size was normal in size. Right Atrium: Right atrial size was normal in size. Pericardium: There is no evidence of pericardial effusion. Mitral Valve: The mitral valve is normal in structure. No  evidence of mitral valve regurgitation. No evidence of mitral valve stenosis. Tricuspid Valve: The tricuspid valve is normal in structure. Tricuspid valve regurgitation is not demonstrated. No evidence of tricuspid stenosis. Aortic Valve: The aortic valve is normal in structure. Aortic valve regurgitation is not visualized. No aortic stenosis is present. Pulmonic Valve: The pulmonic valve was normal in structure. Pulmonic valve regurgitation is not visualized. No evidence of pulmonic stenosis. Aorta: The aortic root is normal in size and structure. Venous: The inferior vena cava is normal in size with greater than 50% respiratory variability, suggesting right atrial pressure of 3 mmHg. IAS/Shunts: No atrial level shunt detected by color flow Doppler. Additional Comments: A device lead is visualized in the right ventricle.  LEFT VENTRICLE PLAX 2D LVIDd:         6.60 cm   Diastology LVIDs:         5.10 cm   LV e' medial:    4.46 cm/s LV PW:         1.10 cm   LV E/e' medial:  10.6 LV IVS:        1.10 cm   LV e' lateral:   6.31 cm/s LVOT diam:     2.30 cm   LV E/e' lateral: 7.5 LV SV:         52 LV SV Index:   21 LVOT Area:     4.15 cm  RIGHT VENTRICLE             IVC RV S prime:     18.20 cm/s  IVC diam: 2.60 cm TAPSE (M-mode): 1.8 cm LEFT ATRIUM             Index LA diam:        4.50 cm 1.84 cm/m LA Vol (A2C):   41.6 ml 17.05 ml/m LA Vol (A4C):   65.0 ml 26.63 ml/m LA Biplane Vol: 54.0 ml 22.13 ml/m  AORTIC VALVE LVOT Vmax:   73.40 cm/s LVOT Vmean:  50.900 cm/s LVOT VTI:    0.125  m  AORTA Ao Root diam: 3.80 cm Ao Asc diam:  3.40 cm MITRAL VALVE MV Area (PHT): 4.60 cm    SHUNTS MV Decel Time: 165 msec    Systemic VTI:  0.12 m MV E velocity: 47.20 cm/s  Systemic Diam: 2.30 cm MV A velocity: 78.60 cm/s MV E/A ratio:  0.60 Oneil Parchment MD Electronically signed by Oneil Parchment MD Signature Date/Time: 06/13/2024/4:13:08 PM    Final    CT LUMBAR SPINE WO CONTRAST Result Date: 06/13/2024 EXAM: CT OF THE LUMBAR SPINE  WITHOUT CONTRAST 06/13/2024 02:26:05 AM TECHNIQUE: CT of the lumbar spine was performed without the administration of intravenous contrast. Multiplanar reformatted images are provided for review. Automated exposure control, iterative reconstruction, and/or weight based adjustment of the mA/kV was utilized to reduce the radiation dose to as low as reasonably achievable. COMPARISON: Lumbar spine CT 02/28/2024. CLINICAL HISTORY: Lumbar radiculopathy, symptoms persist with > 6 wks treatment. FINDINGS: BONES AND ALIGNMENT: Normal vertebral body heights. No acute fracture or suspicious bone lesion. Normal alignment. There is normal bone mineralization. There are 5 lumbar segments. There is mild chronic anterior wedging of the T12 and L1 vertebral bodies, stable. L2 through L5 vertebral bodies are normal in height. Moderate lumbar spondylosis is again noted. DEGENERATIVE CHANGES: At T11-T12, there is a slight diffuse disc bulge. There is mild disc space loss with Schmorl's nodes. No herniation or stenosis. At T12-L1, there is moderate disc space loss with vacuum phenomenon. There is a small calcified central disc extrusion which is nonstenosing. There is a mild diffuse annular bulge without herniation or stenosis. At L1-L2, there is moderate disc space loss, Schmorl's nodes, and vacuum phenomenon. There is a mild diffuse annular bulge, dorsal ligamentous thickening, and mild facet joint spurring, in combination causing mild spinal canal stenosis. There is mild foraminal stenosis. At L2-L3, there is moderate disc space loss with vacuum phenomenon. Due to moderate diffuse disc bulge, ligamentous thickening, and facet spurring, there is mild to moderate spinal canal stenosis. Due to facet spurs and inferior foraminal disc bulging, there is bilateral moderate foraminal stenosis. At L3-L4, the disc is normal in height. There is a mild concentric disc bulge causing mild spinal canal stenosis and partial effacement of the  subarticular zones. There is mild ligamentous thickening and bilateral facet joint spurring with mild left foraminal stenosis. At L4-L5, there is normal disc height, moderate facet hypertrophy, ligamentous thickening, and diffuse disc bulge causing mild to moderate spinal canal stenosis with effacement of the subarticular zones. There is bilateral mild to moderate foraminal stenosis. At L5-S1, the disc is normal in height. There is minimal broad based disc bulge without herniation or canal stenosis. There is moderate right and mild left facet hypertrophy and bilateral mild foraminal stenosis. SOFT TISSUES: There is scattered calcific plaque in the abdominal aorta. Spurring at the SI joints with mild nonerosive sacroiliitis. No paraspinal mass or fluid collections. IMPRESSION: 1. Degenerative changes with mild to moderate spinal canal stenosis at L2-L3 and L4-L5. 2. Moderate bilateral foraminal stenosis at L2-L3 and mild to moderate bilateral foraminal stenosis at L4-L5. 3. No evidence of fractures or malalignment , with additional detailed degenerative findings discussed above. 4. Aortic atherosclerosis. 5. Nonerosive sacroiliitis. Electronically signed by: Francis Quam MD 06/13/2024 03:14 AM EST RP Workstation: [id number]   CT SHOULDER RIGHT WO CONTRAST Result Date: 06/13/2024 EXAM: CT RIGHT SHOULDER, WITHOUT IV CONTRAST 06/13/2024 02:26:05 AM TECHNIQUE: Axial images were acquired through the right shoulder without IV contrast. Reformatted images were reviewed. Automated  exposure control, iterative reconstruction, and/or weight based adjustment of the mA/kV was utilized to reduce the radiation dose to as low as reasonably achievable. COMPARISON: Comparison is made with yesterday's plain film series. CLINICAL HISTORY: Shoulder pain, chronic, bursitis suspected, xray done. FINDINGS: BONES: The bone mineralization is normal. There is no evidence of fracture, dislocation, or malalignment. There is narrowing and  spurring with acromial subcortical degenerative cystic change at the [facility] joint, and an accessory os acromiale. There is near complete mid to lower glenohumeral joint space loss with inferior osteophytes. JOINTS: No dislocation. There is near complete mid to lower glenohumeral joint space loss with inferior osteophytes. There is narrowing and spurring with acromial subcortical degenerative cystic change at the [facility] joint. There is no substantial joint effusion. SOFT TISSUES: There is a small calcification in the conjoined portion of the supraspinatus/infraspinatus tendon complex compatible with calcific tendinopathy. Rotator cuff muscles are normal in bulk. The deltoid and visualized pectoralis musculature are unremarkable. The trapezius muscle appears normal. The rotator cuff tendons are not well evaluated, but no through-and-through tears are suspected. No fluid collections or masses are identified without contrast. There is partial visualization of bipolar pacemaker wiring. LUNGS: There are mild paraseptal and centrilobular emphysematous changes in the right upper lobe. IMPRESSION: 1. Near complete mid to lower glenohumeral joint space loss with inferior osteophytes, consistent with advanced osteoarthritis. 2. Small calcification in the conjoined portion of the supraspinatus/infraspinatus tendon complex, consistent with calcific tendinopathy. 3. Accessory os acromiale. 4. Rotator cuff tendons are not well evaluated, but no through-and-through tears are suspected. 5. Pulmonary emphysema is an independent risk factor for lung cancer. Recommend consideration for evaluation for a low-dose CT lung cancer screening program. Electronically signed by: Francis Quam MD 06/13/2024 02:50 AM EST RP Workstation: [id number]   DG Shoulder Right Result Date: 06/13/2024 EXAM: 1 VIEW(S) XRAY OF THE RIGHT SHOULDER 06/13/2024 12:03:13 AM COMPARISON: Comparison with 02/25/2024. CLINICAL HISTORY: R shoulder pain. FINDINGS: BONES AND  JOINTS: Degenerative changes in the glenohumeral joint. No evidence of acute fracture or dislocation. No malalignment. No focal bone lesion or bone destruction. Bone cortex appears intact. Acromioclavicular and coracoclavicular spaces are normal. SOFT TISSUES: Soft tissues are unremarkable. No abnormal calcifications. Visualized lung is unremarkable. IMPRESSION: 1. No acute fracture or dislocation. 2. Degenerative changes in the glenohumeral joint. Electronically signed by: Elsie Gravely MD 06/13/2024 12:08 AM EST RP Workstation: [id number]   CT Chest Wo Contrast Result Date: 06/12/2024 EXAM: CT CHEST WITHOUT CONTRAST 06/12/2024 06:45:03 PM TECHNIQUE: CT of the chest was performed without the administration of intravenous contrast. Multiplanar reformatted images are provided for review. Automated exposure control, iterative reconstruction, and/or weight based adjustment of the mA/kV was utilized to reduce the radiation dose to as low as reasonably achievable. COMPARISON: 03/16/2024. CLINICAL HISTORY: chest pain, shortness of breath, radiating to L back and R shoulder. FINDINGS: MEDIASTINUM: Mild cardiomegaly. Pacer wires in the right heart. The central airways are clear. LYMPH NODES: No mediastinal, hilar or axillary lymphadenopathy. LUNGS AND PLEURA: Atelectasis in the left lower lobe adjacent to the heart. No pulmonary edema. No pleural effusion or pneumothorax. SOFT TISSUES/BONES: No acute abnormality of the bones or soft tissues. UPPER ABDOMEN: Limited images of the upper abdomen demonstrates no acute abnormality. IMPRESSION: 1. No acute findings. 2. Left lower lobe atelectasis adjacent to the heart. 3. Mild cardiomegaly. Electronically signed by: Franky Crease MD 06/12/2024 07:03 PM EST RP Workstation: [id number]   DG Chest Portable 1 View Result Date: 06/12/2024 CLINICAL DATA:  Chest pain EXAM:  PORTABLE CHEST 1 VIEW COMPARISON:  March 16, 2024 FINDINGS: Evaluation is limited by positioning. The  cardiomediastinal silhouette is unchanged and enlarged in contour.LEFT chest AICD. No pleural effusion. No pneumothorax. LEFT retrocardiac opacity, likely atelectasis. Perihilar vascular fullness without overt edema. IMPRESSION: Perihilar vascular fullness without overt edema. Electronically Signed   By: Corean Salter M.D.   On: 06/12/2024 13:47    Scheduled Meds:  amiodarone   200 mg Oral Daily   atorvastatin   10 mg Oral Daily   clonazePAM   0.5 mg Oral BID   colchicine   0.6 mg Oral Daily   fluticasone   2 spray Each Nare Daily   fluticasone  furoate-vilanterol  1 puff Inhalation Daily   furosemide   80 mg Intravenous BID   gabapentin   300 mg Oral QHS   insulin  aspart  0-9 Units Subcutaneous Q4H   insulin  glargine-yfgn  15 Units Subcutaneous QHS   lidocaine   1 patch Transdermal Q24H   loratadine   10 mg Oral Daily   methocarbamol   500 mg Oral Daily    morphine  injection  4 mg Intravenous Once   pantoprazole   40 mg Oral Daily   predniSONE   40 mg Oral Q breakfast   rivaroxaban   20 mg Oral Q supper   sertraline   75 mg Oral Daily   sodium chloride  flush  3 mL Intravenous Q12H   tamsulosin   0.8 mg Oral Daily   Continuous Infusions:   LOS: 0 days   Ivonne Mustache, MD [facility] P1/11/2024, 11:30 AM   "

## 2024-06-14 NOTE — Discharge Instructions (Signed)
 "  [employer]elessness Coordinated Entry Program. If you are experiencing homelessness in [place], [place] , your first point of contact should be Pensions Consultant. You can reach Coordinated Entry by calling ([phone]) [phone] or by emailing [email or web address].  Community access points: [employer] [phone] [address], [place]) every Tuesday from 9am-10am. [facility] ([address]. [address] [place]) every Wednesday from 8am-9am.   -The [employer] 867-856-2724) offers several services to local families, as funding allows. The Emergency Assistance Program (EAP), which they administer, provides household goods, free food, clothing, and financial aid to people in need in the [place]  area. The EAP program does have some qualification, and counselors will interview clients for financial assistance by written referral only. Referrals need to be made by the Department of Social Services or by other EAP approved human services agencies or charities in the area.  -[facility] of [employer], which can be reached at [phone], offers emergency assistance programs for those in need of help, such as food, rent assistance, a soup kitchen, shelter, and clothing. They are based in [facility] [place]  but provide a number of services to those that qualify for assistance.   [facility] Department of Social Services may be able to offer temporary financial assistance and cash grants for paying rent and utilities, Help may be provided for local county residents who may be experiencing personal crisis when other resources, including government programs, are not available. Call [phone]  -[facility] [employer] is a [employer] agency, The organization can offer emergency assistance for paying rent, caremark rx, utilities, food,  household products and furniture. They offer extensive emergency and transitional housing for families, children and single women, and also run a Boy's and [employer]. Thrift Shops, Secondary School Teacher, and other aid offered too. [address] [place], [place]  [address], [phone]  -[place] Low Income Energy Assistance Program -- This is offered for [facility] families. The federal government created [employer] Program provides a one-time cash grant payment to help eligible low-income families pay their electric and heating bills. [address] [place], [place]  [address], [phone]  -[place] Emergency Assistance -- A program offers emergency utility and rent funds for greater [place] area residents. The program can also provide counseling and referrals to charities and government programs. Also provides food and a free meal program that serves lunch Mondays - Saturdays and dinner seven days per week to individuals in the community. [address], [place], [place]  [address], [phone]  -[employer] - Offers affordable apartment and housing communities across      [place] and [place]. The low income and seniors can access public housing, rental assistance to qualified applicants, and apply for the section 8 rent subsidy program. Other programs include Chiropractor and Engineer, Maintenance. [address] [place], [place]  [address], dial [phone].  -The [facility] provides transitional housing to veterans and the disabled. Clients will also access other services too, including assistance in applying for Disability, life skills classes, case management, and assistance in finding permanent housing. [address] [place], [place]  [address], call [phone]  -[facility] [place] Transitional Housing through [employer] is for  people who were just evicted or that are formerly homeless. The non-profit will also help then gain self-sufficiency, find a home or apartment  to live in, and also provides information on rent assistance when needed. Phone [phone]  -The [facility] helps low income, elderly, or disabled residents in seven counties in the [place] ([place], [place], [place], [place], [place], [place], [place], and [place]) save energy and reduce their utility bills by improving energy efficiency. Phone [phone].  -[employer] is located in the [place] [facility] in the [employer], [address], [place], [place] [address]. Parking is in the rear of the building. Phone: [phone]   General Email: info@gsohc .org  [facility] provides free housing counseling assistance in locating affordable rental housing or housing with support services for families and individuals in crisis and the chronically homeless. We provide potential resources for other housing needs like utilities. Our trained counselors also work with clients on budgeting and financial literacy in effort to empower them to take control of their financial situations. [employer] collaborates with homeless service providers and other stakeholders as part of the [employer] COC (Continuum of Care). The (COC) is a regional/local planning body that coordinates housing and services funding for homeless families and individuals. The role of [facility] in the COC is through housing counseling to work with people we serve on diversion strategies for those that are at imminent risk of becoming homeless. We also work with the Coordinated Assessment/Entry Specialist who attempts to find temporary solutions and/or connects the people to Housing First, Rapid Re-housing or transitional housing programs. Our Homelessness Prevention Housing Counselors meet  with clients on business days (Monday-Fridays, except scheduled holidays) from 8:30 am to 4:30 pm.  Legal assistance for evictions, foreclosure, and more -If you need free legal advice on civil issues, such as foreclosures, evictions, electronics engineer, government programs, domestic issues and more, Armed Forces Operational Officer Aid of [place]  [facility]) is a associate professor firm that provides free legal services and counsel to lower income people, seniors, disabled, and others, The goal is to ensure everyone has access to justice and fair representation. Call them at [phone].  [facility] for Housing and Community Studies can provide info about obtaining legal assistance with evictions. Phone ([phone]. Data Processing Manager  The [employer], [employer]. offers job and dispensing optician. Resources are focused on helping students obtain the skills and experiences that are necessary to compete in today's challenging and tight job market. The non-profit faith-based community action agency offers internship trainings as well as classroom instruction. Classes are tailored to meet the needs of people in the [facility] region. [place], [place] [address], [phone] Foreclosure Prevention/Debt Services Family Services of the [employer] Credit Counseling Service inludes debt and foreclosure prevention programs for local families. This includes money management, financial advice, budget review and development of a written action plan with a pensions consultant to help solve specific individual financial problems. In addition, housing and mortgage counselors can also provide pre- and post-purchase homeownership counseling, default resolution counseling (to prevent foreclosure) and reverse mortgage counseling. A Debt Management Program allows people and families with a high level of credit card or medical debt to consolidate and repay consumer debt and loans to  creditors and rebuild positive credit ratings and scores. Contact (336) D7650557. Community Clinics in [place] -Health Department [facility]: [address] [place], [place], [address]. [phone].  -Health Department [place] [facility]: [address], [place], [address]. [phone].  -[facility] offers medical care through a group of doctors, pharmacies  and other healthcare related agencies that offer services for low income, uninsured adults in [place]. Also offers adult Dental care and assistance with applying for an [employer]. Call [phone].   [facility]. This center provides low-cost health care to those without health insurance. Services offered include an onsite pharmacy. Phone [phone]. [address], [place].  -Medication Assistance Program serves as a link between pharmaceutical companies and patients to provide low cost or free prescription medications. This service is available for residents who meet certain income restrictions and have no insurance coverage. PLEASE CALL [phone]) [phone] [place]) OR ([phone] ([place])  -One Step Further: Materials Engineer, The [employer] Support & Nutrition Program, [place]. Call [phone]/ [phone].  Food Emergency Planning/management Officer -Urban [facility]-Food Bank: [address], [place] [address]. Phone [phone]  -[facility] Food [facility]: [address], [place], [place] [address]. [phone].  -[place] Food Finder: https://findfood.bargaincontractor.si  FLEEING VI[place]ENCE  -Family Services of the [place]- 24/7 Crisis line [phone]) -[facility] Family Justice Centers: (430) [phone]) 641-SAFE (260)[phone])  Transportation  -[facility] for an application. No fee for people over the age of 34. P: [phone]. Address: [address], [place], [place] [address]  -Senior Wheels Transportation-Age 88 and over. Limit of 1 ride per week for ambulatory participants. Limit 1 ride per month for non-ambulatory participants needing wheelchair transportation. Contact: (336) [phone] ([place]), [phone] [facility]).  -Access [place]: Available for people with disabilities who are unable to use fixed-route bus service. Fee applies. Contact: [phone] (For application requests)/(336) [phone] (Customer service)/(336) [phone] (I-ride reservation line)/(336) [phone] (Access [place] reservation line)   Building Services Engineer.org  Homeless Day Center  -[facility])   Day Center: M-F 8a-3p [address]  [place], [place] [address] [phone] Services include: laundry, barbering, support groups, case management, phone & computer access, showers, AA/NA mtgs, mental health/substance abuse nurse, job skills class, disability information, VA assistance, spiritual classes, etc.        HOMELESS SHELTERS [employer] Shelter at [employer]- Call [phone] ext. 347 or ext. 336. Located at [address], [place], [place] [address]  [facility] Mens Shelter- Call [phone]. Located at [address] [place] [address] [place] [address].  [facility]- [employer]. Call [phone]. Office located at [address], [place] [address].  [facility] Family Housing through [place] [phone].  [facility] Family [facility]- Call [phone]. Located at [address] [place], [place], [place] [address].  [facility] at the Inn-For Pregnant mothers. Call [phone]. Located at [address] [place], [address].  [facility]-For men in [place]. Call [phone].  Home of [employer] for [employer] [phone]. Office located at [address] [place], [address].  [employer] be agreeable to help with chores. Call [phone] ext. 5000.  Men's: [address] [place], [place] [address]. Women's: [facility]  [address] [place], [place] [address]   Crisis Services [facility] Mobile Crisis Management- [phone]  [facility] [address] [place], [place] [address]. Phone: [phone]   *[place] 2-1-1 is another useful way to locate resources in the community. Visit shedsizes.ch to find service information online. If you need additional assistance, 2-1-1 Referral Specialists are available 24 hours a day, every day by dialing 2-1-1 or  [phone] from any phone. The call is free, confidential, and available in any language. "

## 2024-06-14 NOTE — TOC Progression Note (Signed)
 Transition of Care [facility]) - Progression Note    Patient Details  Name: [name] MRN: [id number] Date of Birth: [birth year]  Transition of Care [facility]) CM/SW Contact  Milus Fritze LITTIE [place], [place] Phone Number: 06/14/2024, 1:22 PM  Clinical Narrative:    CSW called pt regarding resources for his gas bill. Pt stated he was interested in resources to help him pay for it, CSW added resources to his AVS.                      Expected Discharge Plan and Services                                               Social Drivers of Health (SDOH) Interventions SDOH Screenings   Food Insecurity: No Food Insecurity (06/13/2024)  Housing: Low Risk (06/13/2024)  Transportation Needs: No Transportation Needs (06/13/2024)  Utilities: At Risk (06/13/2024)  Alcohol Screen: Low Risk (05/25/2023)  Depression (PHQ2-9): Low Risk (05/18/2024)  Financial Resource Strain: Low Risk (05/25/2023)  Recent Concern: Financial Resource Strain - High Risk (03/24/2023)  Physical Activity: Inactive (05/25/2023)  Social Connections: Moderately Isolated (03/17/2024)  Stress: Stress Concern Present (05/25/2023)  Tobacco Use: Medium Risk (06/12/2024)  Health Literacy: Adequate Health Literacy (05/25/2023)    Readmission Risk Interventions    03/23/2024    4:27 PM 12/21/2023    4:08 PM  Readmission Risk Prevention Plan  Transportation Screening Complete Complete  Medication Review (RN Care Manager) Complete Complete  PCP or Specialist appointment within 3-5 days of discharge Complete Complete  HRI or Home Care Consult Complete Complete  Palliative Care Screening Not Applicable Not Applicable  Skilled Nursing Facility Not Applicable Not Applicable

## 2024-06-14 NOTE — Progress Notes (Signed)
 Heart Failure Navigator Progress Note  Assessed for Heart & Vascular TOC clinic readiness.  Patient does not meet criteria due to he is an Advanced Heart Failure Team patient of Dr. Zenaida. .   Navigator will sign off at this time.   Stephane Haddock, BSN, Scientist, clinical (histocompatibility and immunogenetics) Only

## 2024-06-14 NOTE — Progress Notes (Signed)
 Patient ID: SOURISH ALLENDER, male   DOB: 10/01/1965, 59 y.o.   MRN: [id number]  Latest Reference Range & Units 06/14/24 15:48 06/14/24 17:20  Glucose-Capillary 70 - 99 mg/dL 579 (H) 585 (H)  (H): Data is abnormally high  Patient CBG 420. Md notified. Ordered to proceed with sliding scale and give 15 units, recheck when dinner tray arrives, and give scheduled 10 units. CBG was 414. MD notified. MD aware.  Verdie JONETTA Collier, RN

## 2024-06-14 NOTE — Progress Notes (Signed)
 Mobility Specialist Progress Note:    06/14/24 1340  Mobility  Activity Pivoted/transferred from chair to bed  Level of Assistance Minimal assist, patient does 75% or more  Assistive Device Other (Comment) (HHA)  Distance Ambulated (ft) 3 ft  Range of Motion/Exercises Active  Activity Response Tolerated fair  Mobility Referral Yes  Mobility visit 1 Mobility  Mobility Specialist Start Time (ACUTE ONLY) 1340  Mobility Specialist Stop Time (ACUTE ONLY) 1347  Mobility Specialist Time Calculation (min) (ACUTE ONLY) 7 min   Received pt sitting in recliner requesting to return back to bed. Pt c/o of back pain and shoulder pain. Unable to use right arm d/t pain. Pt able to stand using HHA-MinA needing a moment for back pain to subside. Pt able to take steps to pivot back in bed. Pt able to lift legs and lay back in bed w/ no assist. Left pt in bed w/ all needs met.   Venetia Keel Mobility Specialist Please Neurosurgeon or Rehab Office at [phone]

## 2024-06-14 NOTE — Progress Notes (Signed)
" ° °  Device interrogation per rep: single, very brief episode of NSVT on 12/27, no events since  Report printed in patient chart.   Discussed with Dr. Pietro, given results of device interrogation, there was nothing that would explain patients symptoms. Therefore, patient is stable to discharge home from cardiology perspective    [name] DELENA Donath, PA-C 06/14/2024 10:30 AM   "

## 2024-06-14 NOTE — Evaluation (Signed)
 Physical Therapy Evaluation Patient Details Name: [name] MRN: [id number] DOB: 1966/05/01 Today's Date: 06/14/2024  History of Present Illness  59 yo male presents to ED 06/12/24 with chest pain x4 days down R arm.   PMH: DM2, nonischemic cardiomyopathy, CHF, ICD, CKDIIIB, prior VT storm 2023, DVT, COPD, obesity, HTN, gout, afib, syncope with [facility] 9/25, PNA 10/25  Clinical Impression  Pt admitted with above diagnosis. Pt from home with his brother who works out of the home. On good days pt showers and drives to mom's house. He generally does this once a week. However, for past week he reports being bedbound because of weakness and pain. Pt with R>L shoulder pain, back pain and B knee pain. He reports he is supposed to get an epidural injection in his back on Thurs this week. On eval pt very limited by pain. Unsteady in standing, was able to take a few steps to chair with a hand to steady but could not tolerate further ambulation. In addition, he does not have supplemental O2 at home and dropped to 87% on RA while working with PT. Recommend HHPT and HHaide if possible at d/c.  Pt currently with functional limitations due to the deficits listed below (see PT Problem List). Pt will benefit from acute skilled PT to increase their independence and safety with mobility to allow discharge.           If plan is discharge home, recommend the following: A little help with walking and/or transfers;A little help with bathing/dressing/bathroom;Assist for transportation;Help with stairs or ramp for entrance   Can travel by private vehicle        Equipment Recommendations None recommended by PT  Recommendations for Other Services  OT consult    Functional Status Assessment Patient has had a recent decline in their functional status and demonstrates the ability to make significant improvements in function in a reasonable and predictable amount of time.     Precautions / Restrictions  Precautions Precautions: Fall Recall of Precautions/Restrictions: Intact Precaution/Restrictions Comments: bilateral knee OA and back pain Restrictions Weight Bearing Restrictions Per Provider Order: No      Mobility  Bed Mobility Overal bed mobility: Modified Independent             General bed mobility comments: increased time and effort needed    Transfers Overall transfer level: Needs assistance Equipment used: None Transfers: Sit to/from Stand, Bed to chair/wheelchair/BSC Sit to Stand: Contact guard assist   Step pivot transfers: Contact guard assist       General transfer comment: was unable to stand on first attempt. Second time was able to rise with CGA and take steps to chair on L    Ambulation/Gait               General Gait Details: deferred due to pain and SOB.  Stairs            Wheelchair Mobility     Tilt Bed    Modified Rankin (Stroke Patients Only)       Balance Overall balance assessment: Needs assistance Sitting-balance support: No upper extremity supported, Feet supported Sitting balance-Leahy Scale: Good     Standing balance support: No upper extremity supported Standing balance-Leahy Scale: Poor Standing balance comment: unsteady in standing, in part due to pain and instability B knees  Pertinent Vitals/Pain Pain Assessment Pain Assessment: Faces Faces Pain Scale: Hurts whole lot Pain Location: B shoulders R>L, back, knees Pain Descriptors / Indicators: Sore, Constant Pain Intervention(s): Limited activity within patient's tolerance, Monitored during session    Home Living Family/patient expects to be discharged to:: Private residence Living Arrangements: Other relatives Available Help at Discharge: Family;Available PRN/intermittently Type of Home: House Home Access: Stairs to enter Entrance Stairs-Rails: Right Entrance Stairs-Number of Steps: 3   Home Layout: One  level Home Equipment: Rollator (4 wheels);Cane - single point;BSC/3in1;Shower seat Additional Comments: no pets. Pt lives with brother. In the process of trying to move    Prior Function Prior Level of Function : Independent/Modified Independent;Driving             Mobility Comments: independent though has been mostly in bed past week due to pain. Leaves house 1x/wk and drives to mom's house. Ambulates with bari rollator ADLs Comments: Independent, reports that he showers daily and this is important to him and has been unable to maintain this schedule past week     Extremity/Trunk Assessment   Upper Extremity Assessment Upper Extremity Assessment: Generalized weakness    Lower Extremity Assessment Lower Extremity Assessment: Generalized weakness    Cervical / Trunk Assessment Cervical / Trunk Assessment: Other exceptions Cervical / Trunk Exceptions: is supposed to get an epidural in low back later this week  Communication   Communication Communication: No apparent difficulties    Cognition Arousal: Alert Behavior During Therapy: WFL for tasks assessed/performed   PT - Cognitive impairments: No apparent impairments                         Following commands: Intact       Cueing Cueing Techniques: Verbal cues     General Comments General comments (skin integrity, edema, etc.): pt's brother present beginning of session. He lives with pt but works outside the home. SPO2 decreased to 87% on RA. Returned to 95% on 2L O2    Exercises General Exercises - Lower Extremity Long Arc Quad: AROM, Both, 10 reps, Seated   Assessment/Plan    PT Assessment Patient needs continued PT services  PT Problem List Decreased strength;Decreased range of motion;Decreased activity tolerance;Decreased balance;Decreased mobility;Decreased knowledge of precautions;Pain;Cardiopulmonary status limiting activity       PT Treatment Interventions DME instruction;Gait  training;Functional mobility training;Therapeutic activities;Stair training;Therapeutic exercise;Balance training;Patient/family education    PT Goals (Current goals can be found in the Care Plan section)  Acute Rehab PT Goals Patient Stated Goal: move to new home, get stronger PT Goal Formulation: With patient Time For Goal Achievement: 06/28/24 Potential to Achieve Goals: Fair    Frequency Min 2X/week     Co-evaluation               AM-PAC PT 6 Clicks Mobility  Outcome Measure Help needed turning from your back to your side while in a flat bed without using bedrails?: None Help needed moving from lying on your back to sitting on the side of a flat bed without using bedrails?: None Help needed moving to and from a bed to a chair (including a wheelchair)?: A Little Help needed standing up from a chair using your arms (e.g., wheelchair or bedside chair)?: A Little Help needed to walk in hospital room?: A Lot Help needed climbing 3-5 steps with a railing? : Total 6 Click Score: 17    End of Session Equipment Utilized During Treatment: Oxygen  Activity Tolerance: Patient  limited by pain Patient left: in chair;with call bell/phone within reach Nurse Communication: Mobility status PT Visit Diagnosis: Pain;Muscle weakness (generalized) (M62.81);Difficulty in walking, not elsewhere classified (R26.2) Pain - Right/Left: Right Pain - part of body: Shoulder;Knee (back)    Time: 8878-8855 PT Time Calculation (min) (ACUTE ONLY): 23 min   Charges:   PT Evaluation $PT Eval Moderate Complexity: 1 Mod PT Treatments $Therapeutic Activity: 8-22 mins PT General Charges $$ ACUTE PT VISIT: 1 Visit         Richerd Lipoma, PT  Acute Rehab Services Secure chat preferred Office [phone]   Richerd CROME Wiley Flicker 06/14/2024, 2:02 PM

## 2024-06-14 NOTE — Inpatient Diabetes Management (Addendum)
 Inpatient Diabetes Program Recommendations  AACE/ADA: New Consensus Statement on Inpatient Glycemic Control (2015)  Target Ranges:  Prepandial:   less than 140 mg/dL      Peak postprandial:   less than 180 mg/dL (1-2 hours)      Critically ill patients:  140 - 180 mg/dL   Lab Results  Component Value Date   GLUCAP 130 (H) 06/14/2024   HGBA1C 9.1 (H) 06/12/2024    Review of Glycemic Control  Latest Reference Range & Units 06/13/24 16:03 06/13/24 21:31 06/14/24 00:22 06/14/24 04:19 06/14/24 07:15  Glucose-Capillary 70 - 99 mg/dL 618 (H) 563 (H) 681 (H) 173 (H) 130 (H)   Diabetes history: DM 2 Outpatient Diabetes medications:  FSL2 Novolog  38 units bid Lantus  40 units q HS Current orders for Inpatient glycemic control:  Semglee  15 units daily Prednisone  40 mg daily Novolog  0-9 units q 4 hours  Inpatient Diabetes Program Recommendations:   May consider increasing Semglee  to 30 units daily (home dose of Lantus  was 40 units daily).  Also consider changing Novolog  correction to tid with meals and HS scale. He will also need Novolog  meal coverage.  Consider adding Novolog  10 units tid with meals (hold if patient eats less than 50% or NPO).   Thanks,  Randall Bullocks, RN, BC-ADM Inpatient Diabetes Coordinator Pager [phone]  (8a-5p)

## 2024-06-14 NOTE — Progress Notes (Signed)
 Additional PT Note  SATURATION QUALIFICATIONS: (This note is used to comply with regulatory documentation for home oxygen )  Patient Saturations on Room Air at Rest = 89%  Patient Saturations on Room Air while Ambulating = 87%  Patient Saturations on 2 Liters of oxygen  while Ambulating = 95%  Please briefly explain why patient needs home oxygen : Pt with desaturation to 87% with SOB with activity when on RA.   Richerd Lipoma, PT  Acute Rehab Services Secure chat preferred Office [phone]

## 2024-06-15 ENCOUNTER — Ambulatory Visit: Attending: Cardiology

## 2024-06-15 ENCOUNTER — Ambulatory Visit: Admitting: Physician Assistant

## 2024-06-15 DIAGNOSIS — R0789 Other chest pain: Secondary | ICD-10-CM | POA: Diagnosis not present

## 2024-06-15 DIAGNOSIS — Z95810 Presence of automatic (implantable) cardiac defibrillator: Secondary | ICD-10-CM

## 2024-06-15 DIAGNOSIS — I5022 Chronic systolic (congestive) heart failure: Secondary | ICD-10-CM

## 2024-06-15 LAB — BASIC METABOLIC PANEL WITH GFR
Anion gap: 12 (ref 5–15)
BUN: 54 mg/dL — ABNORMAL HIGH (ref 6–20)
CO2: 33 mmol/L — ABNORMAL HIGH (ref 22–32)
Calcium: 9.4 mg/dL (ref 8.9–10.3)
Chloride: 92 mmol/L — ABNORMAL LOW (ref 98–111)
Creatinine, Ser: 2.69 mg/dL — ABNORMAL HIGH (ref 0.61–1.24)
GFR, Estimated: 26 mL/min — ABNORMAL LOW
Glucose, Bld: 272 mg/dL — ABNORMAL HIGH (ref 70–99)
Potassium: 3.4 mmol/L — ABNORMAL LOW (ref 3.5–5.1)
Sodium: 137 mmol/L (ref 135–145)

## 2024-06-15 LAB — GLUCOSE, CAPILLARY
Glucose-Capillary: 253 mg/dL — ABNORMAL HIGH (ref 70–99)
Glucose-Capillary: 193 mg/dL — ABNORMAL HIGH (ref 70–99)
Glucose-Capillary: 217 mg/dL — ABNORMAL HIGH (ref 70–99)
Glucose-Capillary: 293 mg/dL — ABNORMAL HIGH (ref 70–99)
Glucose-Capillary: 359 mg/dL — ABNORMAL HIGH (ref 70–99)
Glucose-Capillary: 379 mg/dL — ABNORMAL HIGH (ref 70–99)

## 2024-06-15 MED ORDER — OXYCODONE HCL ER 15 MG PO T12A
15.0000 mg | EXTENDED_RELEASE_TABLET | Freq: Two times a day (BID) | ORAL | Status: DC
  Administered 2024-06-15 – 2024-06-17 (×5): 15 mg via ORAL
  Filled 2024-06-15 (×6): qty 1

## 2024-06-15 MED ORDER — GABAPENTIN 300 MG PO CAPS
300.0000 mg | ORAL_CAPSULE | Freq: Three times a day (TID) | ORAL | Status: DC
  Administered 2024-06-15 – 2024-06-17 (×6): 300 mg via ORAL
  Filled 2024-06-15 (×6): qty 1

## 2024-06-15 MED ORDER — TORSEMIDE 20 MG PO TABS
60.0000 mg | ORAL_TABLET | Freq: Every day | ORAL | Status: DC
  Administered 2024-06-15 – 2024-06-17 (×3): 60 mg via ORAL
  Filled 2024-06-15 (×3): qty 3

## 2024-06-15 MED ORDER — OXYCODONE HCL 5 MG PO TABS: 10.0000 mg | ORAL_TABLET | Freq: Four times a day (QID) | ORAL | Status: DC | PRN

## 2024-06-15 MED ORDER — POTASSIUM CHLORIDE CRYS ER 20 MEQ PO TBCR
40.0000 meq | EXTENDED_RELEASE_TABLET | Freq: Once | ORAL | Status: AC
  Administered 2024-06-15: 40 meq via ORAL
  Filled 2024-06-15: qty 2

## 2024-06-15 NOTE — Progress Notes (Deleted)
 "    06/15/2024 [name] [id number] Jul 14, 1965  Referring provider: Vicci Barnie NOVAK, MD Primary GI doctor: {[id number]}  ASSESSMENT AND PLAN:  Esophageal dysphagia  Patient Care Team: Vicci Barnie NOVAK, MD as PCP - General (Internal Medicine) Pietro Redell RAMAN, MD as PCP - Cardiology (Cardiology) [name] Ole DASEN, MD as PCP - Electrophysiology (Cardiology) Gardenia Led, DO as PCP - Advanced Heart Failure (Cardiology) Jorja Nichole LABOR, RN as VBCI Care Management  HISTORY OF PRESENT ILLNESS: 59 y.o. male with a past medical history listed below presents for evaluation of ***.   *** Discussed the use of AI scribe software for clinical note transcription with the patient, who gave verbal consent to proceed.  History of Present Illness            He  reports that he quit smoking about 10 years ago. His smoking use included cigarettes. He started smoking about 38 years ago. He has a 28 pack-year smoking history. He has never used smokeless tobacco. He reports that he does not currently use alcohol. He reports current drug use. Drug: Marijuana.  RELEVANT GI HISTORY, IMAGING AND LABS: Results          CBC    Component Value Date/Time   WBC 9.2 06/14/2024 0310   RBC 4.21 (L) 06/14/2024 0310   HGB 12.5 (L) 06/14/2024 0310   HGB 13.0 03/28/2024 1632   HCT 37.7 (L) 06/14/2024 0310   HCT 38.5 03/28/2024 1632   PLT 240 06/14/2024 0310   PLT 409 03/28/2024 1632   MCV 89.5 06/14/2024 0310   MCV 89 03/28/2024 1632   MCH 29.7 06/14/2024 0310   MCHC 33.2 06/14/2024 0310   RDW 14.6 06/14/2024 0310   RDW 13.2 03/28/2024 1632   LYMPHSABS 1.7 06/12/2024 1311   LYMPHSABS 1.7 06/28/2018 1440   MONOABS 0.9 06/12/2024 1311   EOSABS 0.0 06/12/2024 1311   EOSABS 0.3 06/28/2018 1440   BASOSABS 0.1 06/12/2024 1311   BASOSABS 0.0 06/28/2018 1440   Recent Labs    02/27/24 0214 02/28/24 0139 02/29/24 0222 03/01/24 0315 03/16/24 1623 03/19/24 0330 03/20/24 0234  03/28/24 1632 06/12/24 1311 06/14/24 0310  HGB 12.1* 12.0* 12.1* 12.2* 12.9* 10.8* 11.1* 13.0 14.2 12.5*    CMP     Component Value Date/Time   NA 137 06/15/2024 0257   NA 140 03/28/2024 1632   K 3.4 (L) 06/15/2024 0257   CL 92 (L) 06/15/2024 0257   CO2 33 (H) 06/15/2024 0257   GLUCOSE 272 (H) 06/15/2024 0257   BUN 54 (H) 06/15/2024 0257   BUN 33 (H) 03/28/2024 1632   CREATININE 2.69 (H) 06/15/2024 0257   CREATININE 1.68 (H) 04/24/2016 1502   CALCIUM  9.4 06/15/2024 0257   PROT 7.4 06/14/2024 0310   PROT 7.1 03/28/2024 1632   ALBUMIN  4.0 06/14/2024 0310   ALBUMIN  4.0 03/28/2024 1632   AST 27 06/14/2024 0310   ALT 45 (H) 06/14/2024 0310   ALKPHOS 88 06/14/2024 0310   BILITOT 0.6 06/14/2024 0310   BILITOT 0.5 03/28/2024 1632   GFRNONAA 26 (L) 06/15/2024 0257   GFRNONAA >89 12/18/2014 1455   GFRAA 53 (L) 07/17/2020 1705   GFRAA >89 12/18/2014 1455      Latest Ref Rng & Units 06/14/2024    3:10 AM 06/12/2024    3:56 PM 03/28/2024    4:32 PM  Hepatic Function  Total Protein 6.5 - 8.1 g/dL 7.4  8.1  7.1   Albumin  3.5 - 5.0 g/dL 4.0  4.4  4.0   AST 15 - 41 U/L 27  30  18    ALT 0 - 44 U/L 45  44  24   Alk Phosphatase 38 - 126 U/L 88  79  90   Total Bilirubin 0.0 - 1.2 mg/dL 0.6  0.7  0.5   Bilirubin, Direct 0.0 - 0.2 mg/dL  0.3        Current Medications:   Facility-Administered Medications Ordered in Other Visits (Endocrine & Metabolic):    insulin  aspart (novoLOG ) injection 0-15 Units   insulin  aspart (novoLOG ) injection 0-5 Units   insulin  aspart (novoLOG ) injection 10 Units   insulin  glargine-yfgn (SEMGLEE ) injection 30 Units   predniSONE  (DELTASONE ) tablet 40 mg  Facility-Administered Medications Ordered in Other Visits (Cardiovascular):    amiodarone  (PACERONE ) tablet 200 mg   atorvastatin  (LIPITOR) tablet 10 mg   furosemide  (LASIX ) injection 80 mg  Facility-Administered Medications Ordered in Other Visits (Respiratory):    albuterol  (PROVENTIL ) (2.5 MG/3ML)  0.083% nebulizer solution 2.5 mg   fluticasone  (FLONASE ) 50 MCG/ACT nasal spray 2 spray   fluticasone  furoate-vilanterol (BREO ELLIPTA ) 100-25 MCG/ACT 1 puff   loratadine  (CLARITIN ) tablet 10 mg  Facility-Administered Medications Ordered in Other Visits (Analgesics):    acetaminophen  (TYLENOL ) tablet 650 mg **OR** acetaminophen  (TYLENOL ) suppository 650 mg   colchicine  tablet 0.6 mg   HYDROmorphone  (DILAUDID ) injection 0.5 mg   morphine  (PF) 4 MG/ML injection 4 mg   oxyCODONE  (Oxy IR/ROXICODONE ) immediate release tablet 5 mg  Facility-Administered Medications Ordered in Other Visits (Hematological):    rivaroxaban  (XARELTO ) tablet 20 mg  Facility-Administered Medications Ordered in Other Visits (Other):    clonazePAM  (KLONOPIN ) tablet 0.5 mg   gabapentin  (NEURONTIN ) capsule 300 mg   lidocaine  (LIDODERM ) 5 % 1 patch   methocarbamol  (ROBAXIN ) tablet 500 mg   ondansetron  (ZOFRAN ) tablet 4 mg **OR** ondansetron  (ZOFRAN ) injection 4 mg   pantoprazole  (PROTONIX ) EC tablet 40 mg   polyethylene glycol (MIRALAX  / GLYCOLAX ) packet 17 g   senna-docusate (Senokot-S) tablet 1 tablet   sertraline  (ZOLOFT ) tablet 75 mg   sodium chloride  flush (NS) 0.9 % injection 3 mL   sodium chloride  flush (NS) 0.9 % injection 3 mL   tamsulosin  (FLOMAX ) capsule 0.8 mg  No current facility-administered medications for this visit. No current outpatient medications on file.  Medical History:  Past Medical History:  Diagnosis Date   AICD (automatic cardioverter/defibrillator) present    Chronic bronchitis (HCC)    get it most q yr (12/23/2013)   Chronic systolic (congestive) heart failure (HCC)    Fracture of left humerus    a. 07/2013.   GERD (gastroesophageal reflux disease)    not at present time   Heart murmur    born w/it    History of renal calculi    Hypertension    Kidney stones    NICM (nonischemic cardiomyopathy) (HCC)    a. 07/2013 Echo: EF 20-25%, diff HK, Gr2 DD, mild MR, mod dil LA/RA.  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    Shockable heart rhythm detected by automated external defibrillator    Syncope    Type II diabetes mellitus (HCC) 2006   Allergies: Allergies[1]   Surgical History:  He  has a past surgical history that includes Cholecystectomy (~ 2010); Cardiac catheterization (08/10/2013); Ureteroscopy; ORIF humerus fracture (Left, 08/12/2013); Implantable cardioverter defibrillator implant (12/23/2013); left heart catheterization with coronary angiogram (N/A, 08/10/2013); implantable  cardioverter defibrillator implant (N/A, 12/23/2013); Inguinal hernia repair (Left, 03/01/2018); RIGHT/LEFT HEART CATH AND CORONARY ANGIOGRAPHY (N/A, 05/12/2022); and LEAD REVISION/REPAIR (N/A, 05/15/2022). Family History:  His family history includes Arthritis in his father and mother; Diabetes in his father and mother; Hypertension in his brother, father, and mother; Mental illness in his brother; Parkinson's disease in his paternal grandfather.  REVIEW OF SYSTEMS  : All other systems reviewed and negative except where noted in the History of Present Illness.  PHYSICAL EXAM: There were no vitals taken for this visit. Physical Exam          Alan JONELLE Coombs, PA-C 7:42 AM      [1]  Allergies Allergen Reactions   Bee Venom Anaphylaxis   Trulicity  [Dulaglutide ] Anaphylaxis and Diarrhea    Extra dose of Trulicity  caused cardiac arrest. Patient experience onset of diarrhea with using this medication - cleared after med stopped.   "

## 2024-06-15 NOTE — Progress Notes (Signed)
 OT Cancellation Note  Patient Details Name: [name] MRN: [id number] DOB: [birth year]   Cancelled Treatment:    Reason Eval/Treat Not Completed:  (Pt reported 9/10 pain and nursing gave medications. Pt would like to finish his  breakfast at this time. Will follow up. Thank you.) Warrick POUR OTR/L  Acute Rehab Services  [phone] office number   Warrick Berber 06/15/2024, 7:49 AM

## 2024-06-15 NOTE — Progress Notes (Signed)
 EPIC Encounter for ICM Monitoring  Patient Name: [name] is a 59 y.o. male Date: 06/15/2024 Primary Care Physican: Vicci Barnie NOVAK, MD Primary Cardiologist: [facility] Electrophysiologist: [facility] 08/24/2023 Office Weight: 295 lbs    09/28/2023 Weight: 291 lbs       10/13/2023  Weight: 280.2 lbs discharge weight 10/20/2023 Weight:  Not weighing at home    10/23/2023 Weight: Pt reports 5/27 he thinks he was 285 lbs at home 11/03/2023 Weight: 294 lbs (10 lb weight gain since 5/16) 12/15/2023 Office Weight: 289 lbs 01/02/2024 Hospital Weight: 285.5 lbs 01/13/2024 Weight: 277 lbs 02/02/2024 Weight: 280 lbs (was 274 lbs) 02/09/2024 Weight: 277 lbs 03/05/2024 Weight: 279 lbs (before 03/07/2024 Metolazone ) 04/13/2024 Weight: 268 -269 lbs 04/28/2024 Weight: 274 lbs 06/06/2024 Weight: 269 lbs   Transmission results reviewed.     Since 06/06/2024 ICM Remote Transmission:  [facility] thoracic impedance suggesting fluid levels returned to normal.     Prescribed dosage:  Torsemide  20 mg take 3 tablet(s) (60 mg total) by mouth daily.  Potassium 20 mEq take 2 tablet(s) (40 mEq total) by mouth twice a day Metolazone  5 mg take 1 tablet by mouth once a week.  Pt takes on Mondays (Nephrologist said he could take it up to twice a week).  Take additional 20 mEq potassium when taking Metolazone .   Labs: 06/15/2024 Creatinine 2.69, BUN 54, Potassium 3.4, Sodium 137, GFR 26  06/14/2024 Creatinine 3.12, BUN 49, Potassium 3.4, Sodium 137, GFR 22  06/12/2024 Creatinine 3.00, BUN 40, Potassium 3.5, Sodium 138, GFR 23  05/23/2024 Creatinine 2.83, BUN 36, Potassium 3.6, Sodium 141, GFR 25  04/29/2024 Creatinine 2.87, BUN 36, Potassium 3.4, Sodium 141, GFR 24  04/21/2024 Creatinine 3.15, BUN 30, Potassium 3.6, Sodium 140, GFR 22  03/28/2024 Creatinine 2.40, BUN 33, Potassium 4.6, Sodium 140, GFR 30 03/24/2024 Creatinine 2.51, BUN 59, Potassium 4.0, Sodium 136 A complete set of results can be found in Results  Review.   Recommendations:  No changes.   Follow-up plan: ICM clinic phone appointment on 07/11/2024.   91 day device clinic remote transmission 08/17/2024.     EP/Cardiology Office Visits:  Recall 09/20/2024 with Dr Zenaida.  Recall 09/24/2024 with Dr Inocencio.       Copy of ICM check sent to Dr Inocencio.       Remote monitoring is medically necessary for Heart Failure Management.    Daily Thoracic Impedance ICM trend: 03/15/2024 through 06/13/2024.    12-14 Month Thoracic Impedance ICM trend:     [name], RN 06/15/2024 7:11 AM

## 2024-06-15 NOTE — Evaluation (Signed)
 Occupational Therapy Evaluation Patient Details Name: [name] MRN: [id number] DOB: [birth year] Today's Date: 06/15/2024   History of Present Illness   59 yo male presents to ED 06/12/24 with chest pain x4 days down R arm.   PMH: DM2, nonischemic cardiomyopathy, CHF, ICD, CKDIIIB, prior VT storm 2023, DVT, COPD, obesity, HTN, gout, afib, syncope with [facility] 9/25, PNA 10/25     Clinical Impressions Pt reported at PLOF they were attempting to what they can could do and recently has increased in staying in bed as to fatigued. Pt's brother can assist PRN as works during the day. At this time pt was able to complete sit to stand transfers with close CGA and cues on positioning due to RUE pain. Pt then was able to ambulate with RW and close CGA as noted 1 time L knee slightly buckling with ambulation. During this session still declining to go to SNF and wants to wait for [facility] services until they move. Acute Occupational Therapy to follow and recommendation for mobility team to follow.      If plan is discharge home, recommend the following:   A little help with walking and/or transfers;A little help with bathing/dressing/bathroom;Assistance with cooking/housework;Assist for transportation;Help with stairs or ramp for entrance     Functional Status Assessment   Patient has had a recent decline in their functional status and demonstrates the ability to make significant improvements in function in a reasonable and predictable amount of time.     Equipment Recommendations   None recommended by OT     Recommendations for Other Services         Precautions/Restrictions   Precautions Precautions: Fall Recall of Precautions/Restrictions: Intact Precaution/Restrictions Comments: bilateral knee OA and back pain Restrictions Weight Bearing Restrictions Per Provider Order: No     Mobility Bed Mobility               General bed mobility comments: pt presented in chair     Transfers Overall transfer level: Needs assistance Equipment used: Rolling walker (2 wheels) Transfers: Sit to/from Stand Sit to Stand: Contact guard assist           General transfer comment: needs increase in time and cues on hand placement to decrease in RUE pain      Balance Overall balance assessment: Needs assistance Sitting-balance support: Feet supported Sitting balance-Leahy Scale: Good     Standing balance support: Bilateral upper extremity supported Standing balance-Leahy Scale: Poor Standing balance comment: relaint on BUE support and then had one slight knee buuckling L knee                           ADL either performed or assessed with clinical judgement   ADL Overall ADL's : Needs assistance/impaired Eating/Feeding: Sitting;Independent   Grooming: Oral care;Wash/dry face;Set up;Sitting   Upper Body Bathing: Contact guard assist;Minimal assistance;Sitting       Upper Body Dressing : Contact guard assist;Sitting       Toilet Transfer: Contact guard assist;Rolling walker (2 wheels)           Functional mobility during ADLs: Contact guard assist;Cueing for safety;Cueing for sequencing;Rolling walker (2 wheels)       Vision Baseline Vision/History: 1 Wears glasses Ability to See in Adequate Light: 0 Adequate Patient Visual Report: No change from baseline Vision Assessment?: No apparent visual deficits     Perception         Praxis  Pertinent Vitals/Pain Pain Assessment Pain Assessment: 0-10 Pain Score: 9  Pain Location: B shoulders R>L, back, knees Pain Descriptors / Indicators: Sore, Constant Pain Intervention(s): Limited activity within patient's tolerance, Monitored during session, Repositioned, Premedicated before session     Extremity/Trunk Assessment Upper Extremity Assessment Upper Extremity Assessment: RUE deficits/detail RUE Deficits / Details: Pt still reproted pain in R shoulder and only would allow  for trave shoulder flex/abduction. WFL distally but will guard due to pain. He also reported pushing the chair in the room even though that amount of pain level RUE Sensation: WNL RUE Coordination: decreased gross motor   Lower Extremity Assessment Lower Extremity Assessment: Defer to PT evaluation   Cervical / Trunk Assessment Cervical / Trunk Assessment: Other exceptions Cervical / Trunk Exceptions: pt reported they were going to get an injection thi week or next   Communication Communication Communication: No apparent difficulties   Cognition Arousal: Alert Behavior During Therapy: [facility] for tasks assessed/performed Cognition:  (noted some decrease in ability to recall medications with nursing staff in session. Advised to have pt with brother complete set up of medications)                               Following commands: Intact       Cueing  General Comments   Cueing Techniques: Verbal cues      Exercises     Shoulder Instructions      Home Living Family/patient expects to be discharged to:: Private residence Living Arrangements: Other relatives Available Help at Discharge: Family;Available PRN/intermittently Type of Home: House Home Access: Stairs to enter [employer] of Steps: 3 Entrance Stairs-Rails: Right Home Layout: One level     Bathroom Shower/Tub: Tub/shower unit;Curtain   Bathroom Toilet: Handicapped height Bathroom Accessibility: Yes   Home Equipment: Rollator (4 wheels);Cane - single point;BSC/3in1;Shower seat   Additional Comments: no pets. Pt lives with brother. In the process of trying to move. Pt reported the house they are planning to go into has a ramped entrance and 1 level with a tub unit.      Prior Functioning/Environment Prior Level of Function : Independent/Modified Independent;Driving             Mobility Comments: independent though has been mostly in bed past week due to pain. Leaves house 1x/wk and  drives to mom's house. Ambulates with bari rollator ADLs Comments: Independent, reports that he showers daily and this is important to him and has been unable to maintain this schedule past week    OT Problem List: Decreased strength;Decreased activity tolerance;Impaired balance (sitting and/or standing);Decreased safety awareness;Decreased knowledge of use of DME or AE;Pain;Impaired UE functional use   OT Treatment/Interventions: Self-care/ADL training;Therapeutic exercise;Therapeutic activities;Patient/family education;Balance training      OT Goals(Current goals can be found in the care plan section)   Acute Rehab OT Goals Patient Stated Goal: to go home when able OT Goal Formulation: With patient Time For Goal Achievement: 06/29/24 Potential to Achieve Goals: Good   OT Frequency:  Min 2X/week    Co-evaluation              AM-PAC OT 6 Clicks Daily Activity     Outcome Measure Help from another person eating meals?: None Help from another person taking care of personal grooming?: A Little Help from another person toileting, which includes using toliet, bedpan, or urinal?: A Little Help from another person bathing (including washing, rinsing, drying)?: A  Little Help from another person to put on and taking off regular upper body clothing?: A Little Help from another person to put on and taking off regular lower body clothing?: A Little 6 Click Score: 19   End of Session Equipment Utilized During Treatment: Rolling walker (2 wheels) Nurse Communication: Mobility status  Activity Tolerance: Patient limited by pain Patient left: in chair;with call bell/phone within reach  OT Visit Diagnosis: Other abnormalities of gait and mobility (R26.89);Muscle weakness (generalized) (M62.81);Pain Pain - Right/Left: Right Pain - part of body: Shoulder                Time: 9074-8991 OT Time Calculation (min): 43 min Charges:  OT General Charges $OT Visit: 1 Visit OT  Evaluation $OT Eval Low Complexity: 1 Low OT Treatments $Self Care/Home Management : 23-37 mins  Warrick POUR OTR/L  Acute Rehab Services  [phone] office number   Warrick Berber 06/15/2024, 10:27 AM

## 2024-06-15 NOTE — Progress Notes (Signed)
 "  PROGRESS NOTE  COULTON [name]  FMW:[id number] DOB: [birth year] DOA: 06/12/2024 PCP: Vicci Barnie NOVAK, MD   Brief Narrative: Patient is a 59 year old male with history of systolic CHF, ICD placement, V. tach, V-fib, OSA not on CPAP, diabetes type 2, GERD, hypertension who presented with chest pain, severe right shoulder pain, back pain.  Cardiology was consulted on admission.  ICD interrogated, no finding of any abnormalities.  Hospital course remarkable for AKI, worsening back pain, right shoulder pain.  Right shoulder CT showed advanced glenohumeral osteoarthritis, orthopedics recommended outpatient follow-up.  Started on high-dose Lasix  today for AKI.  Kidney function improved and currently at baseline.  PT recommended home health.  He does not feel ready to go home today.  Plan for discharge home tomorrow.  Assessment & Plan:  Principal Problem:   Chest pain radiating to arm Active Problems:   Chronic systolic congestive heart failure (HCC)   Asthma-COPD overlap syndrome (HCC)   Stage 3b chronic kidney disease (HCC)   Type 2 diabetes mellitus with hyperlipidemia (HCC)   ICD (implantable cardioverter-defibrillator) in place   Lumbar back pain   BPH with obstruction/lower urinary tract symptoms   Acute deep vein thrombosis (DVT) of left upper extremity (HCC)   Shoulder pain   Decreased urine output   Chest pain   Chest pain: Likely atypical chest pain.  Cardiology cleared for discharge.  ICD interrogated without finding of any events.  Chest pain resolved  Severe right shoulder pain/back pain: Chronic problem.  Right shoulder CT showed advanced glenohumeral osteoarthritis.  I discussed with orthopedics PA, Ozell Purchase, who does not recommend any intervention.  Orthopedics recommend a short trial of steroids and outpatient follow-up with Dr. Sharl.  PT consulted for back pain.  Continue pain management.  Lidocaine  patch ordered for right shoulder. Started on OxyContin  as well.   Increased the dose of gabapentin  to 3 times a day.  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   Follows with Dr. Jerrye, [facility].  Takes torsemide  at home.  Given IV Lasix .  Currently kidney function at baseline.  Torsemide  resumed  History of V. tach: ICD in place.  No abnormal information as per ICD interrogation  Chronic systolic congestive heart failure: Appeared mildly volume overloaded, elevated BNP.  Given IV Lasix .  Now on torsemide  at home dose.  Asthma/COPD: Not on oxygen  at home.  Currently on 2 L of oxygen  per minute.  Qualified for home oxygen   DVT of left upper extremity: On Xarelto   Type 2 diabetes: Continue current insulin  regimen.  Monitor blood sugars  History of BPH: Continue Flomax   Goals of care: Young patient with multiple comorbidities.  CODE STATUS DNR.          DVT prophylaxis:SCDs Start: 06/13/24 0840 rivaroxaban  (XARELTO ) tablet 20 mg     Code Status: Limited: Do not attempt resuscitation (DNR) -DNR-LIMITED -Do Not Intubate/DNI   Family Communication: None at the bedside  Patient status:Inpatient  Patient is from :home  Anticipated discharge un:ynfz  Estimated DC date:tomorrow   Consultants: Cardiology  Procedures: None  Antimicrobials:  Anti-infectives (From admission, onward)    None       Subjective: Patient seen and examined at bedside today.  Hemodynamically stable.  Looks more comfortable than yesterday.  Sitting in the chair.  Working with OT.  Still complains of significant right shoulder pain, back pain.  He does not feel ready to go home today.  He says his brother will be home  tomorrow and he can go home tomorrow.  Objective: Vitals:   06/14/24 2300 06/15/24 0300 06/15/24 0716 06/15/24 0747  BP: (!) 134/92 122/65 133/79   Pulse: 71 72 66   Resp: 17 16 15    Temp: (!) 97.4 F (36.3 C) 97.7 F (36.5 C) 98 F (36.7 C)   TempSrc: Oral Oral Oral   SpO2: 96% 92% 93% 95%  Weight:  124.8 kg     Height:  6' 1 (1.854 m)      Intake/Output Summary (Last 24 hours) at 06/15/2024 1129 Last data filed at 06/14/2024 2300 Gross per 24 hour  Intake 476 ml  Output 3325 ml  Net -2849 ml   Filed Weights   06/13/24 1454 06/14/24 0422 06/15/24 0300  Weight: 123.8 kg 124.4 kg 124.8 kg    Examination:   General exam: Overall comfortable, not in distress, sitting in the chair, obese HEENT: PERRL Respiratory system:  no wheezes or crackles  Cardiovascular system: S1 & S2 heard, RRR.  Gastrointestinal system: Abdomen is nondistended, soft and nontender. Central nervous system: Alert and oriented Extremities: No edema, no clubbing ,no cyanosis, severe tenderness of the right shoulder Skin: No rashes, no ulcers,no icterus       Data Reviewed: I have personally reviewed following labs and imaging studies  CBC: Recent Labs  Lab 06/12/24 1311 06/14/24 0310  WBC 9.8 9.2  NEUTROABS 7.1  --   HGB 14.2 12.5*  HCT 41.9 37.7*  MCV 89.5 89.5  PLT 281 240   Basic Metabolic Panel: Recent Labs  Lab 06/12/24 1311 06/12/24 1556 06/14/24 0310 06/15/24 0257  NA  --  138 137 137  K  --  3.5 3.4* 3.4*  CL  --  93* 92* 92*  CO2  --  30 32 33*  GLUCOSE  --  251* 200* 272*  BUN  --  40* 49* 54*  CREATININE  --  3.00* 3.12* 2.69*  CALCIUM   --  9.8 9.4 9.4  MG 1.7  --  2.1  --   PHOS  --   --  3.0  --      No results found for this or any previous visit (from the past 240 hours).   Radiology Studies: ECHOCARDIOGRAM COMPLETE Result Date: 06/13/2024    ECHOCARDIOGRAM REPORT   Patient Name:   [name] Date of Exam: 06/13/2024 Medical Rec #:  [id number]      Height:       73.0 in Accession #:    [id number]     Weight:       269.0 lb Date of Birth:  [birth year]      BSA:          2.440 m Patient Age:    59 years       BP:           0/0 mmHg Patient Gender: M              HR:           82 bpm. Exam Location:  Inpatient Procedure: 2D Echo, Cardiac Doppler, Color Doppler and Intracardiac             Opacification Agent (Both Spectral and Color Flow Doppler were            utilized during procedure). Indications:    CHF-acute systolic 150.21  History:        Patient has prior history of Echocardiogram examinations, most  recent 03/17/2024. CHF, Defibrillator, COPD; Risk                 Factors:Diabetes.  Sonographer:    Merlynn Argyle Referring Phys: [id number] ANASTASSIA DOUTOVA  Sonographer Comments: Technically difficult study due to poor echo windows. Image acquisition challenging due to patient body habitus and Image acquisition challenging due to respiratory motion. IMPRESSIONS  1. Definity  contrast used. Left ventricular ejection fraction, by estimation, is 45 to 50%. The left ventricle has mildly decreased function. Left ventricular endocardial border not optimally defined to evaluate regional wall motion. The left ventricular internal cavity size was moderately dilated. Left ventricular diastolic parameters are consistent with Grade I diastolic dysfunction (impaired relaxation).  2. Right ventricular systolic function is normal. The right ventricular size is normal.  3. The mitral valve is normal in structure. No evidence of mitral valve regurgitation. No evidence of mitral stenosis.  4. The aortic valve is normal in structure. Aortic valve regurgitation is not visualized. No aortic stenosis is present.  5. The inferior vena cava is normal in size with greater than 50% respiratory variability, suggesting right atrial pressure of 3 mmHg. FINDINGS  Left Ventricle: Definity  contrast used. Left ventricular ejection fraction, by estimation, is 45 to 50%. The left ventricle has mildly decreased function. Left ventricular endocardial border not optimally defined to evaluate regional wall motion. Definity  contrast agent was given IV to delineate the left ventricular endocardial borders. The left ventricular internal cavity size was moderately dilated. There is no left ventricular hypertrophy. Left  ventricular diastolic parameters are consistent with Grade I diastolic dysfunction (impaired relaxation). Right Ventricle: The right ventricular size is normal. No increase in right ventricular wall thickness. Right ventricular systolic function is normal. Left Atrium: Left atrial size was normal in size. Right Atrium: Right atrial size was normal in size. Pericardium: There is no evidence of pericardial effusion. Mitral Valve: The mitral valve is normal in structure. No evidence of mitral valve regurgitation. No evidence of mitral valve stenosis. Tricuspid Valve: The tricuspid valve is normal in structure. Tricuspid valve regurgitation is not demonstrated. No evidence of tricuspid stenosis. Aortic Valve: The aortic valve is normal in structure. Aortic valve regurgitation is not visualized. No aortic stenosis is present. Pulmonic Valve: The pulmonic valve was normal in structure. Pulmonic valve regurgitation is not visualized. No evidence of pulmonic stenosis. Aorta: The aortic root is normal in size and structure. Venous: The inferior vena cava is normal in size with greater than 50% respiratory variability, suggesting right atrial pressure of 3 mmHg. IAS/Shunts: No atrial level shunt detected by color flow Doppler. Additional Comments: A device lead is visualized in the right ventricle.  LEFT VENTRICLE PLAX 2D LVIDd:         6.60 cm   Diastology LVIDs:         5.10 cm   LV e' medial:    4.46 cm/s LV PW:         1.10 cm   LV E/e' medial:  10.6 LV IVS:        1.10 cm   LV e' lateral:   6.31 cm/s LVOT diam:     2.30 cm   LV E/e' lateral: 7.5 LV SV:         52 LV SV Index:   21 LVOT Area:     4.15 cm  RIGHT VENTRICLE             IVC RV S prime:     18.20 cm/s  IVC  diam: 2.60 cm TAPSE (M-mode): 1.8 cm LEFT ATRIUM             Index LA diam:        4.50 cm 1.84 cm/m LA Vol (A2C):   41.6 ml 17.05 ml/m LA Vol (A4C):   65.0 ml 26.63 ml/m LA Biplane Vol: 54.0 ml 22.13 ml/m  AORTIC VALVE LVOT Vmax:   73.40 cm/s LVOT  Vmean:  50.900 cm/s LVOT VTI:    0.125 m  AORTA Ao Root diam: 3.80 cm Ao Asc diam:  3.40 cm MITRAL VALVE MV Area (PHT): 4.60 cm    SHUNTS MV Decel Time: 165 msec    Systemic VTI:  0.12 m MV E velocity: 47.20 cm/s  Systemic Diam: 2.30 cm MV A velocity: 78.60 cm/s MV E/A ratio:  0.60 Oneil Parchment MD Electronically signed by Oneil Parchment MD Signature Date/Time: 06/13/2024/4:13:08 PM    Final     Scheduled Meds:  amiodarone   200 mg Oral Daily   atorvastatin   10 mg Oral Daily   clonazePAM   0.5 mg Oral BID   colchicine   0.6 mg Oral Daily   fluticasone   2 spray Each Nare Daily   fluticasone  furoate-vilanterol  1 puff Inhalation Daily   gabapentin   300 mg Oral TID   insulin  aspart  0-15 Units Subcutaneous TID WC   insulin  aspart  0-5 Units Subcutaneous QHS   insulin  aspart  10 Units Subcutaneous TID WC   insulin  glargine-yfgn  30 Units Subcutaneous QHS   lidocaine   1 patch Transdermal Q24H   loratadine   10 mg Oral Daily   methocarbamol   500 mg Oral Daily    morphine  injection  4 mg Intravenous Once   oxyCODONE   15 mg Oral Q12H   pantoprazole   40 mg Oral Daily   polyethylene glycol  17 g Oral Daily   predniSONE   40 mg Oral Q breakfast   rivaroxaban   20 mg Oral Q supper   senna-docusate  1 tablet Oral BID   sertraline   75 mg Oral Daily   sodium chloride  flush  3 mL Intravenous Q12H   tamsulosin   0.8 mg Oral Daily   torsemide   60 mg Oral Daily   Continuous Infusions:   LOS: 1 day   Ivonne Mustache, MD [facility] P1/12/2024, 11:29 AM   "

## 2024-06-15 NOTE — Plan of Care (Signed)
  Problem: Education: Goal: Individualized Educational Video(s) Outcome: Progressing   Problem: Coping: Goal: Ability to adjust to condition or change in health will improve Outcome: Progressing   Problem: Fluid Volume: Goal: Ability to maintain a balanced intake and output will improve Outcome: Progressing

## 2024-06-15 NOTE — Plan of Care (Signed)
  Problem: Education: Goal: Ability to describe self-care measures that may prevent or decrease complications (Diabetes Survival Skills Education) will improve Outcome: Progressing   Problem: Coping: Goal: Ability to adjust to condition or change in health will improve Outcome: Progressing   Problem: Fluid Volume: Goal: Ability to maintain a balanced intake and output will improve Outcome: Progressing   Problem: Health Behavior/Discharge Planning: Goal: Ability to identify and utilize available resources and services will improve Outcome: Progressing   Problem: Health Behavior/Discharge Planning: Goal: Ability to manage health-related needs will improve Outcome: Progressing

## 2024-06-16 ENCOUNTER — Encounter: Admitting: Physical Medicine and Rehabilitation

## 2024-06-16 DIAGNOSIS — R0789 Other chest pain: Secondary | ICD-10-CM | POA: Diagnosis not present

## 2024-06-16 LAB — BASIC METABOLIC PANEL WITH GFR
Anion gap: 16 — ABNORMAL HIGH (ref 5–15)
BUN: 52 mg/dL — ABNORMAL HIGH (ref 6–20)
CO2: 26 mmol/L (ref 22–32)
Calcium: 9.8 mg/dL (ref 8.9–10.3)
Chloride: 93 mmol/L — ABNORMAL LOW (ref 98–111)
Creatinine, Ser: 2.47 mg/dL — ABNORMAL HIGH (ref 0.61–1.24)
GFR, Estimated: 29 mL/min — ABNORMAL LOW
Glucose, Bld: 302 mg/dL — ABNORMAL HIGH (ref 70–99)
Potassium: 3.8 mmol/L (ref 3.5–5.1)
Sodium: 135 mmol/L (ref 135–145)

## 2024-06-16 LAB — GLUCOSE, CAPILLARY
Glucose-Capillary: 150 mg/dL — ABNORMAL HIGH (ref 70–99)
Glucose-Capillary: 285 mg/dL — ABNORMAL HIGH (ref 70–99)
Glucose-Capillary: 336 mg/dL — ABNORMAL HIGH (ref 70–99)
Glucose-Capillary: 404 mg/dL — ABNORMAL HIGH (ref 70–99)
Glucose-Capillary: 360 mg/dL — ABNORMAL HIGH (ref 70–99)

## 2024-06-16 MED ORDER — INSULIN GLARGINE-YFGN 100 UNIT/ML ~~LOC~~ SOLN
35.0000 [IU] | Freq: Every day | SUBCUTANEOUS | Status: DC
  Administered 2024-06-16: 35 [IU] via SUBCUTANEOUS
  Filled 2024-06-16 (×2): qty 0.35

## 2024-06-16 MED ORDER — INSULIN ASPART 100 UNIT/ML IJ SOLN
12.0000 [IU] | Freq: Three times a day (TID) | INTRAMUSCULAR | Status: DC
  Administered 2024-06-16 – 2024-06-17 (×2): 12 [IU] via SUBCUTANEOUS
  Filled 2024-06-16 (×2): qty 12

## 2024-06-16 MED ORDER — BISACODYL 10 MG RE SUPP
10.0000 mg | Freq: Once | RECTAL | Status: DC
  Filled 2024-06-16: qty 1

## 2024-06-16 NOTE — Progress Notes (Signed)
 "  PROGRESS NOTE  [name]  FMW:[id number] DOB: Oct 26, 1965 DOA: 06/12/2024 PCP: Vicci Barnie NOVAK, MD   Brief Narrative: Patient is a 59 year old male with history of systolic CHF, ICD placement, V. tach, V-fib, OSA not on CPAP, diabetes type 2, GERD, hypertension who presented with chest pain, severe right shoulder pain, back pain.  Cardiology was consulted on admission.  ICD interrogated, no finding of any abnormalities.  Hospital course remarkable for AKI, worsening back pain, right shoulder pain.  Right shoulder CT showed advanced glenohumeral osteoarthritis, orthopedics recommended outpatient follow-up.  Started on high-dose Lasix  today for AKI.  Kidney function improved and currently at baseline.  PT recommended home health.  Plan for discharge home tomorrow.  Assessment & Plan:  Principal Problem:   Chest pain radiating to arm Active Problems:   Chronic systolic congestive heart failure (HCC)   Asthma-COPD overlap syndrome (HCC)   Stage 3b chronic kidney disease (HCC)   Type 2 diabetes mellitus with hyperlipidemia (HCC)   ICD (implantable cardioverter-defibrillator) in place   Lumbar back pain   BPH with obstruction/lower urinary tract symptoms   Acute deep vein thrombosis (DVT) of left upper extremity (HCC)   Shoulder pain   Decreased urine output   Chest pain   Chest pain: Likely atypical chest pain.  Cardiology cleared for discharge.  ICD interrogated without finding of any events.  Chest pain resolved.  There was concern for nonsustained V. tach on 06/16/24 but EP reviewed the telemetry and ruled it out.  Severe right shoulder pain/back pain: Chronic problem.  Right shoulder CT showed advanced glenohumeral osteoarthritis.  I discussed with orthopedics PA, Ozell Purchase, who does not recommend any intervention.  Orthopedics recommend a short trial of steroids and outpatient follow-up with Dr. Sharl.  PT consulted for back pain.  Continue pain management.  Lidocaine  patch  ordered for right shoulder. Started on OxyContin  as well.  Increased the dose of gabapentin  to 3 times a day.  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   Follows with Dr. Jerrye, [facility].  Takes torsemide  at home.  Given IV Lasix .  Currently kidney function at baseline.  Torsemide  resumed  History of V. tach: ICD in place.  No abnormal information as per ICD interrogation  Chronic systolic congestive heart failure: Appeared mildly volume overloaded, elevated BNP.  Given IV Lasix .  Now on torsemide  at home dose.  Asthma/COPD: Not on oxygen  at home.  Currently on 2 L of oxygen  per minute.  Qualified for home oxygen   DVT of left upper extremity: On Xarelto   Type 2 diabetes: Continue current insulin  regimen.  Monitor blood sugars.  Blood sugars running high likely from steroid.  Steroid discontinued.  Continue home regimen on discharge.  History of BPH: Continue Flomax   Goals of care: Young patient with multiple comorbidities.  CODE STATUS DNR.          DVT prophylaxis:SCDs Start: 06/13/24 0840 rivaroxaban  (XARELTO ) tablet 20 mg     Code Status: Limited: Do not attempt resuscitation (DNR) -DNR-LIMITED -Do Not Intubate/DNI   Family Communication: None at the bedside  Patient status:Inpatient  Patient is from :home  Anticipated discharge un:ynfz  Estimated DC date:tomorrow   Consultants: Cardiology  Procedures: None  Antimicrobials:  Anti-infectives (From admission, onward)    None       Subjective: Patient seen and examined at bedside today.  He looks comfortable than yesterday.  Right shoulder pain, back pain better.  He was sitting on the chair  the entire day.  There was concern for nonsustained V. tach earlier this morning.  Telemetry reviewed by EP and it has been ruled out.  No concern for V. tach.  Patient became very anxious after hearing this.  Despite multiple discussions and reassurance about  no abnormal event, he does not feel ready  to go home today.  He says his brother will be coming home late tonight.  He  requested to stay 1 more night  Objective: Vitals:   06/16/24 0521 06/16/24 0713 06/16/24 0750 06/16/24 1137  BP: 124/83 127/86  119/76  Pulse:  65  64  Resp: 17 18  19   Temp:  (!) 97.5 F (36.4 C)  97.7 F (36.5 C)  TempSrc:  Oral  Oral  SpO2:  92% 92% 91%  Weight:      Height:        Intake/Output Summary (Last 24 hours) at 06/16/2024 1245 Last data filed at 06/16/2024 1128 Gross per 24 hour  Intake 363 ml  Output 1750 ml  Net -1387 ml   Filed Weights   06/14/24 0422 06/15/24 0300 06/16/24 0418  Weight: 124.4 kg 124.8 kg 124.7 kg    Examination:  General exam: Overall comfortable, not in distress,obese HEENT: PERRL Respiratory system:  no wheezes or crackles  Cardiovascular system: S1 & S2 heard, RRR.  Gastrointestinal system: Abdomen is nondistended, soft and nontender. Central nervous system: Alert and oriented Extremities: No edema, no clubbing ,no cyanosis Skin: No rashes, no ulcers,no icterus       Data Reviewed: I have personally reviewed following labs and imaging studies  CBC: Recent Labs  Lab 06/12/24 1311 06/14/24 0310  WBC 9.8 9.2  NEUTROABS 7.1  --   HGB 14.2 12.5*  HCT 41.9 37.7*  MCV 89.5 89.5  PLT 281 240   Basic Metabolic Panel: Recent Labs  Lab 06/12/24 1311 06/12/24 1556 06/14/24 0310 06/15/24 0257 06/16/24 0946  NA  --  138 137 137 135  K  --  3.5 3.4* 3.4* 3.8  CL  --  93* 92* 92* 93*  CO2  --  30 32 33* 26  GLUCOSE  --  251* 200* 272* 302*  BUN  --  40* 49* 54* 52*  CREATININE  --  3.00* 3.12* 2.69* 2.47*  CALCIUM   --  9.8 9.4 9.4 9.8  MG 1.7  --  2.1  --   --   PHOS  --   --  3.0  --   --      No results found for this or any previous visit (from the past 240 hours).   Radiology Studies: No results found.   Scheduled Meds:  amiodarone   200 mg Oral Daily   atorvastatin   10 mg Oral Daily   bisacodyl   10 mg Rectal Once   clonazePAM   0.5  mg Oral BID   colchicine   0.6 mg Oral Daily   fluticasone   2 spray Each Nare Daily   fluticasone  furoate-vilanterol  1 puff Inhalation Daily   gabapentin   300 mg Oral TID   insulin  aspart  0-15 Units Subcutaneous TID WC   insulin  aspart  0-5 Units Subcutaneous QHS   insulin  aspart  12 Units Subcutaneous TID WC   insulin  glargine-yfgn  35 Units Subcutaneous QHS   lidocaine   1 patch Transdermal Q24H   loratadine   10 mg Oral Daily   methocarbamol   500 mg Oral Daily    morphine  injection  4 mg Intravenous Once   oxyCODONE   15 mg Oral Q12H   pantoprazole   40 mg Oral Daily   polyethylene glycol  17 g Oral Daily   rivaroxaban   20 mg Oral Q supper   senna-docusate  1 tablet Oral BID   sertraline   75 mg Oral Daily   sodium chloride  flush  3 mL Intravenous Q12H   tamsulosin   0.8 mg Oral Daily   torsemide   60 mg Oral Daily   Continuous Infusions:   LOS: 2 days   Ivonne Mustache, MD [facility] P1/01/2025, 12:45 PM   "

## 2024-06-16 NOTE — Inpatient Diabetes Management (Signed)
 Inpatient Diabetes Program Recommendations  AACE/ADA: New Consensus Statement on Inpatient Glycemic Control (2015)  Target Ranges:  Prepandial:   less than 140 mg/dL      Peak postprandial:   less than 180 mg/dL (1-2 hours)      Critically ill patients:  140 - 180 mg/dL   Lab Results  Component Value Date   GLUCAP 285 (H) 06/16/2024   HGBA1C 9.1 (H) 06/12/2024    Review of Glycemic Control  Latest Reference Range & Units 06/15/24 11:38 06/15/24 17:24 06/15/24 20:54 06/16/24 05:43 06/16/24 11:30  Glucose-Capillary 70 - 99 mg/dL 706 (H) 640 (H) 620 (H) 150 (H) 285 (H)   Diabetes history: DM 2 Outpatient Diabetes medications:  FSL2 Novolog  38 units bid Lantus  40 units q HS Current orders for Inpatient glycemic control:  Semglee  30 units daily Novolog  10 units tid with meals  Prednisone  40 mg daily Novolog  0-15 units tid with meals and HS Inpatient Diabetes Program Recommendations:   Consider increasing Lantus  to 35 units daily and increase Novolog  meal coverage to 12 units tid with meals.   Thanks,  Randall Bullocks, RN, BC-ADM Inpatient Diabetes Coordinator Pager [phone]  (8a-5p)

## 2024-06-16 NOTE — Progress Notes (Signed)
 PT Cancellation Note  Patient Details Name: LEONTE HORRIGAN MRN: [id number] DOB: 30-May-1966   Cancelled Treatment:    Reason Eval/Treat Not Completed: Other (comment) (Pt declined reporting he does not feel up for therapy at the moment. Pt was encouraged to continue to mobilize with staff. PT will follow up when able.)   Leontine Hilt DPT Acute Rehab Services [phone] Prefer contact via chat   Leontine NOVAK Tiah Heckel 06/16/2024, 4:31 PM

## 2024-06-17 ENCOUNTER — Other Ambulatory Visit (HOSPITAL_COMMUNITY): Payer: Self-pay

## 2024-06-17 ENCOUNTER — Telehealth (HOSPITAL_COMMUNITY): Payer: Self-pay

## 2024-06-17 LAB — GLUCOSE, CAPILLARY
Glucose-Capillary: 180 mg/dL — ABNORMAL HIGH (ref 70–99)
Glucose-Capillary: 256 mg/dL — ABNORMAL HIGH (ref 70–99)
Glucose-Capillary: 314 mg/dL — ABNORMAL HIGH (ref 70–99)

## 2024-06-17 MED ORDER — POLYETHYLENE GLYCOL 3350 17 GM/SCOOP PO POWD
17.0000 g | Freq: Every day | ORAL | 0 refills | Status: AC
  Filled 2024-06-17: qty 238, 14d supply, fill #0

## 2024-06-17 MED ORDER — NOVOLOG FLEXPEN 100 UNIT/ML ~~LOC~~ SOPN: 12.0000 [IU] | PEN_INJECTOR | Freq: Three times a day (TID) | SUBCUTANEOUS | Status: AC

## 2024-06-17 MED ORDER — OXYCODONE HCL ER 15 MG PO T12A
15.0000 mg | EXTENDED_RELEASE_TABLET | Freq: Two times a day (BID) | ORAL | 0 refills | Status: DC
  Filled 2024-06-17 (×4): qty 14, 7d supply, fill #0

## 2024-06-17 MED ORDER — LANTUS SOLOSTAR 100 UNIT/ML ~~LOC~~ SOPN: 35.0000 [IU] | PEN_INJECTOR | Freq: Every day | SUBCUTANEOUS | Status: AC

## 2024-06-17 MED ORDER — GABAPENTIN 300 MG PO CAPS
300.0000 mg | ORAL_CAPSULE | Freq: Three times a day (TID) | ORAL | 0 refills | Status: AC
  Filled 2024-06-17: qty 90, 30d supply, fill #0

## 2024-06-17 MED ORDER — OXYCODONE HCL 10 MG PO TABS
10.0000 mg | ORAL_TABLET | Freq: Four times a day (QID) | ORAL | 0 refills | Status: DC | PRN
  Filled 2024-06-17: qty 15, 4d supply, fill #0

## 2024-06-17 NOTE — TOC Transition Note (Signed)
 Transition of Care [facility]) - Discharge Note   Patient Details  Name: [name] MRN: [id number] Date of Birth: 1965-06-23  [facility]) CM/SW Contact:  Andrez JULIANNA George, RN Phone Number: 06/17/2024, 11:52 AM   Clinical Narrative:     Pt discharging home with self care. Pt refused HH.  Oxygen  with [employer] with tank in the room and pt to call and schedule delivery of the concentrator.  Pt has transportation home.  Final next level of care: Home/Self Care Barriers to Discharge: No Barriers Identified   Patient Goals and CMS Choice            Discharge Placement                       Discharge Plan and Services Additional resources added to the After Visit Summary for                  DME Arranged: Oxygen  DME Agency: [employer]                  Social Drivers of Health (SDOH) Interventions SDOH Screenings   Food Insecurity: No Food Insecurity (06/13/2024)  Housing: Low Risk (06/13/2024)  Transportation Needs: No Transportation Needs (06/13/2024)  Utilities: At Risk (06/13/2024)  Alcohol Screen: Low Risk (05/25/2023)  Depression (PHQ2-9): Low Risk (05/18/2024)  Financial Resource Strain: Low Risk (05/25/2023)  Recent Concern: Financial Resource Strain - High Risk (03/24/2023)  Physical Activity: Inactive (05/25/2023)  Social Connections: Moderately Isolated (03/17/2024)  Stress: Stress Concern Present (05/25/2023)  Tobacco Use: Medium Risk (06/12/2024)  Health Literacy: Adequate Health Literacy (05/25/2023)     Readmission Risk Interventions    03/23/2024    4:27 PM 12/21/2023    4:08 PM  Readmission Risk Prevention Plan  Transportation Screening Complete Complete  Medication Review (RN Care Manager) Complete Complete  PCP or Specialist appointment within 3-5 days of discharge Complete Complete  HRI or Home Care Consult Complete Complete  Palliative Care Screening Not Applicable Not Applicable  Skilled Nursing Facility Not  Applicable Not Applicable

## 2024-06-17 NOTE — Progress Notes (Signed)
 PT Cancellation Note  Patient Details Name: [name] MRN: [id number] DOB: [birth year]   Cancelled Treatment:    Reason Eval/Treat Not Completed: Patient declined, no reason specified (Pt refused reporting he's in too much pain and does not feel mobility would be beneficial. PT will follow up when able.)  Leontine Hilt DPT Acute Rehab Services [phone] Prefer contact via chat   Leontine NOVAK Favour Aleshire 06/17/2024, 10:55 AM

## 2024-06-17 NOTE — Discharge Summary (Signed)
 Physician Discharge Summary  [name] FMW:[id number] DOB: [birth year] DOA: 06/12/2024  PCP: Vicci Barnie NOVAK, MD  Admit date: 06/12/2024 Discharge date: 06/17/2024  Admitted From: Home Disposition:  Home  Discharge Condition:Stable CODE STATUS:DNR Diet recommendation: Carb Modified    Brief/Interim Summary: Patient is a 59 year old male with history of systolic CHF, ICD placement, V. tach, V-fib, OSA not on CPAP, diabetes type 2, GERD, hypertension who presented with chest pain, severe right shoulder pain, back pain. Cardiology was consulted on admission. ICD interrogated, no finding of any abnormalities. Hospital course remarkable for AKI, worsening back pain, right shoulder pain. Right shoulder CT showed advanced glenohumeral osteoarthritis, orthopedics recommended outpatient follow-up. Started on high-dose Lasix  today for AKI. Kidney function improved and currently at baseline. PT recommended home health.  Medically stable for discharge home today  Following problems were addressed during the hospitalization:  Chest pain: Likely atypical chest pain.  Cardiology cleared for discharge.  ICD interrogated without finding of any events.  Chest pain resolved.  There was concern for nonsustained V. tach on 06/16/24 but EP reviewed the telemetry and ruled it out.   Severe right shoulder pain/back pain: Chronic problem.  Right shoulder CT showed advanced glenohumeral osteoarthritis.  I discussed with orthopedics PA, Ozell Purchase, who does not recommend any intervention.  Orthopedics recommend a short trial of steroids and outpatient follow-up with Dr. Sharl.  PT consulted for back pain.  Continue pain management.  Lidocaine  patch ordered for right shoulder. Started on OxyContin  as well.  Increased the dose of gabapentin  300 mg to 3 times a day.   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   Follows with Dr. Jerrye, [facility].  Takes torsemide  /metolazone  at home.  Given IV  Lasix .  Currently kidney function at baseline.  Torsemide  resumed   History of V. tach: ICD in place.  No abnormal information as per ICD interrogation   Chronic systolic congestive heart failure: Appeared mildly volume overloaded, elevated BNP.  Given IV Lasix .  Now on torsemide  at home dose.  Appears euvolemic today   Asthma/COPD: Not on oxygen  at home.  Currently on 2 L of oxygen  per minute.  Qualified for home oxygen    DVT of left upper extremity: On Xarelto    Type 2 diabetes: Continue current insulin  regimen.  Monitor blood sugars.  Blood sugars running high likely from steroid.  Steroid discontinued.  Continue home regimen on discharge.   History of BPH: Continue Flomax    Goals of care: Young patient with multiple comorbidities.  CODE STATUS DNR.       Discharge Diagnoses:  Principal Problem:   Chest pain radiating to arm Active Problems:   Chronic systolic congestive heart failure (HCC)   Asthma-COPD overlap syndrome (HCC)   Stage 3b chronic kidney disease (HCC)   Type 2 diabetes mellitus with hyperlipidemia (HCC)   ICD (implantable cardioverter-defibrillator) in place   Lumbar back pain   BPH with obstruction/lower urinary tract symptoms   Acute deep vein thrombosis (DVT) of left upper extremity (HCC)   Shoulder pain   Decreased urine output   Chest pain    Discharge Instructions  Discharge Instructions     Discharge instructions   Complete by: As directed    1)Please take your medications as instructed 2)Monitor your blood sugars at home 3)Follow up with your PCP in a week 4)Follow up with orthopedics as an outpatient for the management of her right shoulder pain.  Name and number the provider has been attached.  Please call for appointment. 5)Follow up with cardiology as an outpatient   Increase activity slowly   Complete by: As directed       Allergies as of 06/17/2024       Reactions   Bee Venom Anaphylaxis   Trulicity  [dulaglutide ] Anaphylaxis,  Diarrhea   Extra dose of Trulicity  caused cardiac arrest. Patient experience onset of diarrhea with using this medication - cleared after med stopped.        Medication List     STOP taking these medications    acetaminophen -codeine  300-30 MG tablet Commonly known as: TYLENOL  #3       TAKE these medications    allopurinol  100 MG tablet Commonly known as: ZYLOPRIM  Take 1 tablet (100 mg total) by mouth daily.   amiodarone  200 MG tablet Commonly known as: PACERONE  Take 200 mg by mouth daily.   atorvastatin  10 MG tablet Commonly known as: LIPITOR Take 1 tablet (10 mg total) by mouth daily.   benzonatate  200 MG capsule Commonly known as: TESSALON  Take 200 mg by mouth 2 (two) times daily as needed for cough.   cetirizine  10 MG tablet Commonly known as: ZYRTEC  Take 1 tablet by mouth once daily   cholecalciferol  25 MCG (1000 UNIT) tablet Commonly known as: VITAMIN D3 Take 1,000 Units by mouth in the morning.   clonazePAM  0.5 MG tablet Commonly known as: KLONOPIN  Take 1 tablet (0.5 mg total) by mouth 2 (two) times daily. for anxiety   Clotrimazole  1 % Oint Apply 1 Application topically 2 (two) times daily as needed (irritation).   colchicine  0.6 MG tablet Take 0.5 tablets (0.3 mg total) by mouth every Monday, Wednesday, and Friday.   EPINEPHrine  0.3 mg/0.3 mL Soaj injection Commonly known as: EPI-PEN Inject 0.3 mg into the muscle as needed.   fluticasone  50 MCG/ACT nasal spray Commonly known as: FLONASE  Place 2 sprays into both nostrils daily.   FreeStyle Libre 2 Plus Sensor Misc Change sensor every 15 days.   gabapentin  300 MG capsule Commonly known as: NEURONTIN  Take 1 capsule (300 mg total) by mouth 3 (three) times daily. What changed: when to take this   Lantus  SoloStar 100 UNIT/ML Solostar Pen Generic drug: insulin  glargine Inject 35 Units into the skin at bedtime. What changed: how much to take   latanoprost 0.005 % ophthalmic solution Commonly  known as: XALATAN Place 1 drop into both eyes at bedtime.   magnesium  oxide 400 MG tablet Commonly known as: MAG-OX Take 1 tablet (400 mg total) by mouth daily.   methocarbamol  500 MG tablet Commonly known as: ROBAXIN  Take 500 mg by mouth daily.   metolazone  5 MG tablet Commonly known as: ZAROXOLYN  Take 1 tablet (5 mg total) by mouth once a week.   NovoLOG  FlexPen 100 UNIT/ML FlexPen Generic drug: insulin  aspart Inject 12 Units into the skin 3 (three) times daily with meals. What changed:  how much to take when to take this   nystatin  powder Commonly known as: MYCOSTATIN /NYSTOP  Apply 1 Application topically 3 (three) times daily. What changed: when to take this   Oxycodone  HCl 10 MG Tabs Take 1 tablet (10 mg total) by mouth every 6 (six) hours as needed for moderate pain (pain score 4-6).   oxyCODONE  15 mg 12 hr tablet Commonly known as: OXYCONTIN  Take 1 tablet (15 mg total) by mouth every 12 (twelve) hours.   pantoprazole  40 MG tablet Commonly known as: Protonix  Take 1 tablet (40 mg total) by mouth daily.   Pen Needles  32G X 4 MM Misc Use to inject insulin  3 times daily.   polyethylene glycol 17 g packet Commonly known as: MIRALAX  / GLYCOLAX  Take 17 g by mouth daily. Start taking on: June 18, 2024   potassium chloride  SA 20 MEQ tablet Commonly known as: KLOR-CON  M Take 2 tablets (40 mEq total) by mouth 2 (two) times daily. Take an extra 20 mEq when you take Metolazone .   prednisoLONE  acetate 1 % ophthalmic suspension Commonly known as: PRED FORTE  Place 1 drop into the left eye at bedtime.   rivaroxaban  20 MG Tabs tablet Commonly known as: XARELTO  Take 1 tablet (20 mg total) by mouth daily with supper.   sertraline  50 MG tablet Commonly known as: ZOLOFT  Take 1.5 tablets (75 mg total) by mouth daily.   Symbicort  160-4.5 MCG/ACT inhaler Generic drug: budesonide -formoterol  Inhale 2 puffs into the lungs in the morning and at bedtime.   tamsulosin  0.4  MG Caps capsule Commonly known as: FLOMAX  Take 2 capsules (0.8 mg total) by mouth daily.   torsemide  20 MG tablet Commonly known as: DEMADEX  Take 3 tablets (60 mg total) by mouth daily.               Durable Medical Equipment  (From admission, onward)           Start     Ordered   06/14/24 1504  For home use only DME Other see comment  Once       Comments: POC at a setting of 2 liters  Question:  Length of Need  Answer:  12 Months   06/14/24 1504   06/14/24 1441  For home use only DME oxygen   Once       Question Answer Comment  Length of Need Lifetime   Mode or (Route) Nasal cannula   Liters per Minute 2   Oxygen  delivery system: Gas      06/14/24 1440            Follow-up Information     Sharl Selinda Dover, MD. Schedule an appointment as soon as possible for a visit in 1 week(s).   Specialty: Orthopedic Surgery Contact information: [address] [place] [address] [phone]         [facility] (DME) Follow up.   Specialty: DME Services Why: home oxygen  Contact information: [address] [place]  [address] [phone]        Scarlett Ronal Caldron, NP Follow up.   Why: TIME : 2:00 PM DATE : JANUARY 22 , 2026 THURSDAY  PLEASE BRING ALL CURRENT MEDICATION, ID and INS CARD Contact information: [address] [place] [address] [phone]                Allergies[1]  Consultations: Cardiology   Procedures/Studies: ECHOCARDIOGRAM COMPLETE Result Date: 06/13/2024    ECHOCARDIOGRAM REPORT   Patient Name:   [name] Date of Exam: 06/13/2024 Medical Rec #:  [id number]      Height:       73.0 in Accession #:    [id number]     Weight:       269.0 lb Date of Birth:  03-31-1966      BSA:          2.440 m Patient Age:    58 years       BP:           0/0 mmHg Patient Gender: M  HR:           82 bpm. Exam Location:  Inpatient Procedure: 2D Echo,  Cardiac Doppler, Color Doppler and Intracardiac            Opacification Agent (Both Spectral and Color Flow Doppler were            utilized during procedure). Indications:    CHF-acute systolic 150.21  History:        Patient has prior history of Echocardiogram examinations, most                 recent 03/17/2024. CHF, Defibrillator, COPD; Risk                 Factors:Diabetes.  Sonographer:    Merlynn Argyle Referring Phys: [id number] ANASTASSIA DOUTOVA  Sonographer Comments: Technically difficult study due to poor echo windows. Image acquisition challenging due to patient body habitus and Image acquisition challenging due to respiratory motion. IMPRESSIONS  1. Definity  contrast used. Left ventricular ejection fraction, by estimation, is 45 to 50%. The left ventricle has mildly decreased function. Left ventricular endocardial border not optimally defined to evaluate regional wall motion. The left ventricular internal cavity size was moderately dilated. Left ventricular diastolic parameters are consistent with Grade I diastolic dysfunction (impaired relaxation).  2. Right ventricular systolic function is normal. The right ventricular size is normal.  3. The mitral valve is normal in structure. No evidence of mitral valve regurgitation. No evidence of mitral stenosis.  4. The aortic valve is normal in structure. Aortic valve regurgitation is not visualized. No aortic stenosis is present.  5. The inferior vena cava is normal in size with greater than 50% respiratory variability, suggesting right atrial pressure of 3 mmHg. FINDINGS  Left Ventricle: Definity  contrast used. Left ventricular ejection fraction, by estimation, is 45 to 50%. The left ventricle has mildly decreased function. Left ventricular endocardial border not optimally defined to evaluate regional wall motion. Definity  contrast agent was given IV to delineate the left ventricular endocardial borders. The left ventricular internal cavity size was moderately  dilated. There is no left ventricular hypertrophy. Left ventricular diastolic parameters are consistent with Grade I diastolic dysfunction (impaired relaxation). Right Ventricle: The right ventricular size is normal. No increase in right ventricular wall thickness. Right ventricular systolic function is normal. Left Atrium: Left atrial size was normal in size. Right Atrium: Right atrial size was normal in size. Pericardium: There is no evidence of pericardial effusion. Mitral Valve: The mitral valve is normal in structure. No evidence of mitral valve regurgitation. No evidence of mitral valve stenosis. Tricuspid Valve: The tricuspid valve is normal in structure. Tricuspid valve regurgitation is not demonstrated. No evidence of tricuspid stenosis. Aortic Valve: The aortic valve is normal in structure. Aortic valve regurgitation is not visualized. No aortic stenosis is present. Pulmonic Valve: The pulmonic valve was normal in structure. Pulmonic valve regurgitation is not visualized. No evidence of pulmonic stenosis. Aorta: The aortic root is normal in size and structure. Venous: The inferior vena cava is normal in size with greater than 50% respiratory variability, suggesting right atrial pressure of 3 mmHg. IAS/Shunts: No atrial level shunt detected by color flow Doppler. Additional Comments: A device lead is visualized in the right ventricle.  LEFT VENTRICLE PLAX 2D LVIDd:         6.60 cm   Diastology LVIDs:         5.10 cm   LV e' medial:    4.46 cm/s LV PW:  1.10 cm   LV E/e' medial:  10.6 LV IVS:        1.10 cm   LV e' lateral:   6.31 cm/s LVOT diam:     2.30 cm   LV E/e' lateral: 7.5 LV SV:         52 LV SV Index:   21 LVOT Area:     4.15 cm  RIGHT VENTRICLE             IVC RV S prime:     18.20 cm/s  IVC diam: 2.60 cm TAPSE (M-mode): 1.8 cm LEFT ATRIUM             Index LA diam:        4.50 cm 1.84 cm/m LA Vol (A2C):   41.6 ml 17.05 ml/m LA Vol (A4C):   65.0 ml 26.63 ml/m LA Biplane Vol: 54.0 ml  22.13 ml/m  AORTIC VALVE LVOT Vmax:   73.40 cm/s LVOT Vmean:  50.900 cm/s LVOT VTI:    0.125 m  AORTA Ao Root diam: 3.80 cm Ao Asc diam:  3.40 cm MITRAL VALVE MV Area (PHT): 4.60 cm    SHUNTS MV Decel Time: 165 msec    Systemic VTI:  0.12 m MV E velocity: 47.20 cm/s  Systemic Diam: 2.30 cm MV A velocity: 78.60 cm/s MV E/A ratio:  0.60 Oneil Parchment MD Electronically signed by Oneil Parchment MD Signature Date/Time: 06/13/2024/4:13:08 PM    Final    CT LUMBAR SPINE WO CONTRAST Result Date: 06/13/2024 EXAM: CT OF THE LUMBAR SPINE WITHOUT CONTRAST 06/13/2024 02:26:05 AM TECHNIQUE: CT of the lumbar spine was performed without the administration of intravenous contrast. Multiplanar reformatted images are provided for review. Automated exposure control, iterative reconstruction, and/or weight based adjustment of the mA/kV was utilized to reduce the radiation dose to as low as reasonably achievable. COMPARISON: Lumbar spine CT 02/28/2024. CLINICAL HISTORY: Lumbar radiculopathy, symptoms persist with > 6 wks treatment. FINDINGS: BONES AND ALIGNMENT: Normal vertebral body heights. No acute fracture or suspicious bone lesion. Normal alignment. There is normal bone mineralization. There are 5 lumbar segments. There is mild chronic anterior wedging of the T12 and L1 vertebral bodies, stable. L2 through L5 vertebral bodies are normal in height. Moderate lumbar spondylosis is again noted. DEGENERATIVE CHANGES: At T11-T12, there is a slight diffuse disc bulge. There is mild disc space loss with Schmorl's nodes. No herniation or stenosis. At T12-L1, there is moderate disc space loss with vacuum phenomenon. There is a small calcified central disc extrusion which is nonstenosing. There is a mild diffuse annular bulge without herniation or stenosis. At L1-L2, there is moderate disc space loss, Schmorl's nodes, and vacuum phenomenon. There is a mild diffuse annular bulge, dorsal ligamentous thickening, and mild facet joint spurring, in  combination causing mild spinal canal stenosis. There is mild foraminal stenosis. At L2-L3, there is moderate disc space loss with vacuum phenomenon. Due to moderate diffuse disc bulge, ligamentous thickening, and facet spurring, there is mild to moderate spinal canal stenosis. Due to facet spurs and inferior foraminal disc bulging, there is bilateral moderate foraminal stenosis. At L3-L4, the disc is normal in height. There is a mild concentric disc bulge causing mild spinal canal stenosis and partial effacement of the subarticular zones. There is mild ligamentous thickening and bilateral facet joint spurring with mild left foraminal stenosis. At L4-L5, there is normal disc height, moderate facet hypertrophy, ligamentous thickening, and diffuse disc bulge causing mild to moderate spinal canal stenosis with  effacement of the subarticular zones. There is bilateral mild to moderate foraminal stenosis. At L5-S1, the disc is normal in height. There is minimal broad based disc bulge without herniation or canal stenosis. There is moderate right and mild left facet hypertrophy and bilateral mild foraminal stenosis. SOFT TISSUES: There is scattered calcific plaque in the abdominal aorta. Spurring at the SI joints with mild nonerosive sacroiliitis. No paraspinal mass or fluid collections. IMPRESSION: 1. Degenerative changes with mild to moderate spinal canal stenosis at L2-L3 and L4-L5. 2. Moderate bilateral foraminal stenosis at L2-L3 and mild to moderate bilateral foraminal stenosis at L4-L5. 3. No evidence of fractures or malalignment , with additional detailed degenerative findings discussed above. 4. Aortic atherosclerosis. 5. Nonerosive sacroiliitis. Electronically signed by: Francis Quam MD 06/13/2024 03:14 AM EST RP Workstation: [id number]   CT SHOULDER RIGHT WO CONTRAST Result Date: 06/13/2024 EXAM: CT RIGHT SHOULDER, WITHOUT IV CONTRAST 06/13/2024 02:26:05 AM TECHNIQUE: Axial images were acquired through the  right shoulder without IV contrast. Reformatted images were reviewed. Automated exposure control, iterative reconstruction, and/or weight based adjustment of the mA/kV was utilized to reduce the radiation dose to as low as reasonably achievable. COMPARISON: Comparison is made with yesterday's plain film series. CLINICAL HISTORY: Shoulder pain, chronic, bursitis suspected, xray done. FINDINGS: BONES: The bone mineralization is normal. There is no evidence of fracture, dislocation, or malalignment. There is narrowing and spurring with acromial subcortical degenerative cystic change at the [facility] joint, and an accessory os acromiale. There is near complete mid to lower glenohumeral joint space loss with inferior osteophytes. JOINTS: No dislocation. There is near complete mid to lower glenohumeral joint space loss with inferior osteophytes. There is narrowing and spurring with acromial subcortical degenerative cystic change at the [facility] joint. There is no substantial joint effusion. SOFT TISSUES: There is a small calcification in the conjoined portion of the supraspinatus/infraspinatus tendon complex compatible with calcific tendinopathy. Rotator cuff muscles are normal in bulk. The deltoid and visualized pectoralis musculature are unremarkable. The trapezius muscle appears normal. The rotator cuff tendons are not well evaluated, but no through-and-through tears are suspected. No fluid collections or masses are identified without contrast. There is partial visualization of bipolar pacemaker wiring. LUNGS: There are mild paraseptal and centrilobular emphysematous changes in the right upper lobe. IMPRESSION: 1. Near complete mid to lower glenohumeral joint space loss with inferior osteophytes, consistent with advanced osteoarthritis. 2. Small calcification in the conjoined portion of the supraspinatus/infraspinatus tendon complex, consistent with calcific tendinopathy. 3. Accessory os acromiale. 4. Rotator cuff tendons are not  well evaluated, but no through-and-through tears are suspected. 5. Pulmonary emphysema is an independent risk factor for lung cancer. Recommend consideration for evaluation for a low-dose CT lung cancer screening program. Electronically signed by: Francis Quam MD 06/13/2024 02:50 AM EST RP Workstation: [id number]   DG Shoulder Right Result Date: 06/13/2024 EXAM: 1 VIEW(S) XRAY OF THE RIGHT SHOULDER 06/13/2024 12:03:13 AM COMPARISON: Comparison with 02/25/2024. CLINICAL HISTORY: R shoulder pain. FINDINGS: BONES AND JOINTS: Degenerative changes in the glenohumeral joint. No evidence of acute fracture or dislocation. No malalignment. No focal bone lesion or bone destruction. Bone cortex appears intact. Acromioclavicular and coracoclavicular spaces are normal. SOFT TISSUES: Soft tissues are unremarkable. No abnormal calcifications. Visualized lung is unremarkable. IMPRESSION: 1. No acute fracture or dislocation. 2. Degenerative changes in the glenohumeral joint. Electronically signed by: Elsie Gravely MD 06/13/2024 12:08 AM EST RP Workstation: [id number]   CT Chest Wo Contrast Result Date: 06/12/2024 EXAM: CT CHEST WITHOUT CONTRAST 06/12/2024 06:45:03  PM TECHNIQUE: CT of the chest was performed without the administration of intravenous contrast. Multiplanar reformatted images are provided for review. Automated exposure control, iterative reconstruction, and/or weight based adjustment of the mA/kV was utilized to reduce the radiation dose to as low as reasonably achievable. COMPARISON: 03/16/2024. CLINICAL HISTORY: chest pain, shortness of breath, radiating to L back and R shoulder. FINDINGS: MEDIASTINUM: Mild cardiomegaly. Pacer wires in the right heart. The central airways are clear. LYMPH NODES: No mediastinal, hilar or axillary lymphadenopathy. LUNGS AND PLEURA: Atelectasis in the left lower lobe adjacent to the heart. No pulmonary edema. No pleural effusion or pneumothorax. SOFT TISSUES/BONES: No acute  abnormality of the bones or soft tissues. UPPER ABDOMEN: Limited images of the upper abdomen demonstrates no acute abnormality. IMPRESSION: 1. No acute findings. 2. Left lower lobe atelectasis adjacent to the heart. 3. Mild cardiomegaly. Electronically signed by: Franky Crease MD 06/12/2024 07:03 PM EST RP Workstation: [id number]   DG Chest Portable 1 View Result Date: 06/12/2024 CLINICAL DATA:  Chest pain EXAM: PORTABLE CHEST 1 VIEW COMPARISON:  March 16, 2024 FINDINGS: Evaluation is limited by positioning. The cardiomediastinal silhouette is unchanged and enlarged in contour.LEFT chest AICD. No pleural effusion. No pneumothorax. LEFT retrocardiac opacity, likely atelectasis. Perihilar vascular fullness without overt edema. IMPRESSION: Perihilar vascular fullness without overt edema. Electronically Signed   By: Corean Salter M.D.   On: 06/12/2024 13:47      Subjective: Patient seen and examined at bedside today.  Hemodynamically stable.  Overall comfortable.  Sitting in the bed.  No new complaints today.  Still having some low back pain but tolerable.  Feels ready to go home today.  Discharge Exam: Vitals:   06/17/24 0300 06/17/24 0805  BP: 108/75 109/77  Pulse: 78 70  Resp: 17 20  Temp: 97.8 F (36.6 C) 97.8 F (36.6 C)  SpO2: 90% 91%   Vitals:   06/16/24 1900 06/16/24 2300 06/17/24 0300 06/17/24 0805  BP: 121/74 126/80 108/75 109/77  Pulse: 70 64 78 70  Resp: (!) 24 17 17 20   Temp: 97.7 F (36.5 C) 97.6 F (36.4 C) 97.8 F (36.6 C) 97.8 F (36.6 C)  TempSrc: Oral Oral Oral Oral  SpO2: 95% 93% 90% 91%  Weight:   127.1 kg   Height:   6' 1 (1.854 m)     General: Pt is alert, awake, not in acute distress, obese Cardiovascular: RRR, S1/S2 +, no rubs, no gallops Respiratory: CTA bilaterally, no wheezing, no rhonchi Abdominal: Soft, NT, ND, bowel sounds + Extremities: no edema, no cyanosis    The results of significant diagnostics from this hospitalization (including  imaging, microbiology, ancillary and laboratory) are listed below for reference.     Microbiology: No results found for this or any previous visit (from the past 240 hours).   Labs: BNP (last 3 results) Recent Labs    01/18/24 1558 03/16/24 1623 04/21/24 1432  BNP 23.3 21.2 20.6   Basic Metabolic Panel: Recent Labs  Lab 06/12/24 1311 06/12/24 1556 06/14/24 0310 06/15/24 0257 06/16/24 0946  NA  --  138 137 137 135  K  --  3.5 3.4* 3.4* 3.8  CL  --  93* 92* 92* 93*  CO2  --  30 32 33* 26  GLUCOSE  --  251* 200* 272* 302*  BUN  --  40* 49* 54* 52*  CREATININE  --  3.00* 3.12* 2.69* 2.47*  CALCIUM   --  9.8 9.4 9.4 9.8  MG 1.7  --  2.1  --   --   PHOS  --   --  3.0  --   --    Liver Function Tests: Recent Labs  Lab 06/12/24 1556 06/14/24 0310  AST 30 27  ALT 44 45*  ALKPHOS 79 88  BILITOT 0.7 0.6  PROT 8.1 7.4  ALBUMIN  4.4 4.0   Recent Labs  Lab 06/12/24 1556  LIPASE 27   No results for input(s): AMMONIA in the last 168 hours. CBC: Recent Labs  Lab 06/12/24 1311 06/14/24 0310  WBC 9.8 9.2  NEUTROABS 7.1  --   HGB 14.2 12.5*  HCT 41.9 37.7*  MCV 89.5 89.5  PLT 281 240   Cardiac Enzymes: No results for input(s): CKTOTAL, CKMB, CKMBINDEX, TROPONINI in the last 168 hours. BNP: Invalid input(s): POCBNP CBG: Recent Labs  Lab 06/16/24 2017 06/16/24 2159 06/17/24 0016 06/17/24 0422 06/17/24 0759  GLUCAP 404* 360* 314* 256* 180*   D-Dimer No results for input(s): DDIMER in the last 72 hours. Hgb A1c No results for input(s): HGBA1C in the last 72 hours. Lipid Profile No results for input(s): CHOL, HDL, LDLCALC, TRIG, CHOLHDL, LDLDIRECT in the last 72 hours. Thyroid  function studies No results for input(s): TSH, T4TOTAL, T3FREE, THYROIDAB in the last 72 hours.  Invalid input(s): FREET3 Anemia work up No results for input(s): VITAMINB12, FOLATE, FERRITIN, TIBC, IRON, RETICCTPCT in the last 72  hours. Urinalysis    Component Value Date/Time   COLORURINE YELLOW 06/13/2024 0342   APPEARANCEUR CLEAR 06/13/2024 0342   APPEARANCEUR Clear 07/17/2020 1655   LABSPEC 1.013 06/13/2024 0342   PHURINE 5.0 06/13/2024 0342   GLUCOSEU 150 (A) 06/13/2024 0342   HGBUR NEGATIVE 06/13/2024 0342   BILIRUBINUR NEGATIVE 06/13/2024 0342   BILIRUBINUR negative 01/30/2022 1705   BILIRUBINUR Negative 07/17/2020 1655   KETONESUR NEGATIVE 06/13/2024 0342   PROTEINUR NEGATIVE 06/13/2024 0342   UROBILINOGEN 0.2 01/30/2022 1705   UROBILINOGEN 1.0 06/20/2013 1032   NITRITE NEGATIVE 06/13/2024 0342   LEUKOCYTESUR NEGATIVE 06/13/2024 0342   Sepsis Labs Recent Labs  Lab 06/12/24 1311 06/14/24 0310  WBC 9.8 9.2   Microbiology No results found for this or any previous visit (from the past 240 hours).  Please note: You were cared for by a hospitalist during your hospital stay. Once you are discharged, your primary care physician will handle any further medical issues. Please note that NO REFILLS for any discharge medications will be authorized once you are discharged, as it is imperative that you return to your primary care physician (or establish a relationship with a primary care physician if you do not have one) for your post hospital discharge needs so that they can reassess your need for medications and monitor your lab values.    Time coordinating discharge: 40 minutes  SIGNED:   Ivonne Mustache, MD  [facility] 06/17/2024, 10:46 AM Pager [phone]  If 7PM-7AM, please contact night-coverage [email or web address] Password [email or web address]    [1]  Allergies Allergen Reactions   Bee Venom Anaphylaxis   Trulicity  [Dulaglutide ] Anaphylaxis and Diarrhea    Extra dose of Trulicity  caused cardiac arrest. Patient experience onset of diarrhea with using this medication - cleared after med stopped.

## 2024-06-17 NOTE — Progress Notes (Signed)
 DISCHARGE NOTE HOME [name] to be discharged Home per MD order. Discussed prescriptions and follow up appointments with the patient. Prescriptions given to patient; medication list explained in detail. Patient verbalized understanding.  Skin clean, dry and intact without evidence of skin break down, no evidence of skin tears noted. IV catheter discontinued intact. Site without signs and symptoms of complications. Dressing and pressure applied. Pt denies pain at the site currently. No complaints noted.  Patient free of lines, drains, and wounds. Leaving with home O2  An After Visit Summary (AVS) was printed and given to the patient. Patient escorted via wheelchair, and discharged home via private auto.  [name], RN

## 2024-06-17 NOTE — Telephone Encounter (Signed)
 Pharmacy Patient Advocate Encounter  Received notification from [place] MEDICAID that Prior Authorization for OxyCONTIN  15MG  er tablets has been APPROVED from 06/17/24 to 09/15/24   PA #/Case ID/Reference #: [id number]

## 2024-06-18 ENCOUNTER — Other Ambulatory Visit: Payer: Self-pay | Admitting: Internal Medicine

## 2024-06-20 ENCOUNTER — Telehealth: Payer: Self-pay | Admitting: *Deleted

## 2024-06-20 ENCOUNTER — Other Ambulatory Visit: Payer: Self-pay | Admitting: Physical Medicine and Rehabilitation

## 2024-06-20 DIAGNOSIS — G8929 Other chronic pain: Secondary | ICD-10-CM

## 2024-06-20 DIAGNOSIS — G894 Chronic pain syndrome: Secondary | ICD-10-CM

## 2024-06-20 NOTE — Transitions of Care (Post Inpatient/ED Visit) (Signed)
" ° °  06/20/2024  Name: [name] MRN: [id number] DOB: 11-Jun-1965  Today's TOC FU Call Status: Today's TOC FU Call Status:: Unsuccessful Call (1st Attempt) Unsuccessful Call (1st Attempt) Date: 06/20/24  Attempted to reach the patient regarding the most recent Inpatient/ED visit.  Follow Up Plan: Additional outreach attempts will be made to reach the patient to complete the Transitions of Care (Post Inpatient/ED visit) call.   Andrea Dimes RN, BSN [place]  [facility] RN Care Manager [phone]  "

## 2024-06-20 NOTE — Transitions of Care (Post Inpatient/ED Visit) (Signed)
 "  06/20/2024  Name: [name] MRN: [id number] DOB: [birth year]  Today's TOC FU Call Status: Today's TOC FU Call Status:: Successful TOC FU Call Completed TOC FU Call Complete Date: 06/20/24  Patient's Name and Date of Birth confirmed. Name, DOB  Transition Care Management Follow-up Telephone Call Date of Discharge: 06/17/24 Discharge Facility: [facility]) Type of Discharge: Inpatient Admission Primary Inpatient Discharge Diagnosis:: Chest pain radiating to arm How have you been since you were released from the hospital?: Same Any questions or concerns?: Yes Patient Questions/Concerns:: Patients [employer] has been shut off since being discharged. Patient Questions/Concerns Addressed: Other: (Patient has contacted the gas company and Engineer, Site and is awaiting a call back.)  Items Reviewed: Did you receive and understand the discharge instructions provided?: Yes Medications obtained,verified, and reconciled?: Yes (Medications Reviewed) Any new allergies since your discharge?: No Dietary orders reviewed?: Yes Type of Diet Ordered:: Heart Healthy, carb modified Do you have support at home?: Yes People in Home [RPT]: sibling(s) Name of Support/Comfort Primary Source: Brother/Manuel  Medications Reviewed Today: Medications Reviewed Today     Reviewed by Lucky Andrea LABOR, RN (Registered Nurse) on 06/20/24 at 1337  Med List Status: <None>   Medication Order Taking? Sig Documenting Provider Last Dose Status Informant  allopurinol  (ZYLOPRIM ) 100 MG tablet [id number] Yes Take 1 tablet (100 mg total) by mouth daily. Lee, Jordan, NP  Active Self, Pharmacy Records  amiodarone  (PACERONE ) 200 MG tablet [id number] Yes Take 200 mg by mouth daily. [provider]  Active Self, Pharmacy Records  atorvastatin  (LIPITOR) 10 MG tablet [id number] Yes Take 1 tablet (10 mg total) by mouth daily. Vicci Barnie NOVAK, MD  Active Self, Pharmacy Records  benzonatate  (TESSALON ) 200 MG  capsule [id number]  Take 200 mg by mouth 2 (two) times daily as needed for cough.  Patient not taking: Reported on 06/20/2024   [provider]  Active Self, Pharmacy Records  cetirizine  (ZYRTEC ) 10 MG tablet [id number] Yes Take 1 tablet by mouth once daily Vicci Barnie NOVAK, MD  Active Self, Pharmacy Records  cholecalciferol  (VITAMIN D3) 25 MCG (1000 UNIT) tablet [id number] Yes Take 1,000 Units by mouth in the morning. [provider]  Active Self, Pharmacy Records  clonazePAM  (KLONOPIN ) 0.5 MG tablet [id number] Yes Take 1 tablet (0.5 mg total) by mouth 2 (two) times daily. for anxiety Carvin Arvella HERO, MD  Active Self, Pharmacy Records  Clotrimazole  1 % OINT [id number] Yes Apply 1 Application topically 2 (two) times daily as needed (irritation). Lelon Glendia DASEN, PA-C  Active Self, Pharmacy Records  colchicine  0.6 MG tablet [id number] Yes Take 0.5 tablets (0.3 mg total) by mouth every Monday, Wednesday, and Friday. Lelon Glendia DASEN, PA-C  Active Self, Pharmacy Records  Continuous Glucose Sensor (FREESTYLE LIBRE 2 PLUS SENSOR) [place] [id number] Yes Change sensor every 15 days. Vicci Barnie NOVAK, MD  Active Self, Pharmacy Records  EPINEPHrine  0.3 mg/0.3 mL IJ SOAJ injection [id number] Yes Inject 0.3 mg into the muscle as needed. Iva Marty Saltness, MD  Active Self, Pharmacy Records           Med Note DINO, NEVADA   Dju Dec 19, 2023  8:47 PM)    fluticasone  (FLONASE ) 50 MCG/ACT nasal spray [id number] Yes Place 2 sprays into both nostrils daily. Palmer Purchase, PA-C  Active Self, Pharmacy Records  gabapentin  (NEURONTIN ) 300 MG capsule [id number] Yes Take 1 capsule (300 mg total) by mouth 3 (three) times daily. Jillian Buttery, MD  Active   insulin  aspart (  NOVOLOG  FLEXPEN) 100 UNIT/ML FlexPen [id number] Yes Inject 12 Units into the skin 3 (three) times daily with meals. Jillian Buttery, MD  Active   insulin  glargine (LANTUS  SOLOSTAR) 100 UNIT/ML Solostar Pen [id number] Yes Inject 35 Units into  the skin at bedtime. Jillian Buttery, MD  Active   Insulin  Pen Needle (PEN NEEDLES) 32G X 4 MM MISC [id number] Yes Use to inject insulin  3 times daily. Vicci Barnie NOVAK, MD  Active Self, Pharmacy Records  latanoprost (XALATAN) 0.005 % ophthalmic solution [id number] Yes Place 1 drop into both eyes at bedtime. [provider]  Active Self, Pharmacy Records  magnesium  oxide (MAG-OX) 400 MG tablet [id number] Yes Take 1 tablet (400 mg total) by mouth daily. Sigdel, Santosh, MD  Active Self, Pharmacy Records  methocarbamol  (ROBAXIN ) 500 MG tablet [id number] Yes Take 500 mg by mouth daily. [provider]  Active Self, Pharmacy Records  metolazone  (ZAROXOLYN ) 5 MG tablet [id number] Yes Take 1 tablet (5 mg total) by mouth once a week. Vicci Barnie NOVAK, MD  Active Self, Pharmacy Records           Med Note JACKOLYN WADDELL VEAR [place] Feb 25, 2024 12:20 PM) On Mondays  nystatin  (MYCOSTATIN /NYSTOP ) powder [id number] Yes Apply 1 Application topically 3 (three) times daily.  Patient taking differently: Apply 1 Application topically 2 (two) times daily.   Vicci Barnie NOVAK, MD  Active Self, Pharmacy Records  oxyCODONE  (OXYCONTIN ) 15 mg 12 hr tablet [id number]  Take 1 tablet (15 mg total) by mouth every 12 (twelve) hours.  Patient not taking: Reported on 06/20/2024   Jillian Buttery, MD  Active   Oxycodone  HCl 10 MG TABS [id number] Yes Take 1 tablet (10 mg total) by mouth every 6 (six) hours as needed for moderate pain (pain score 4-6). Jillian Buttery, MD  Active   pantoprazole  (PROTONIX ) 40 MG tablet [id number] Yes Take 1 tablet (40 mg total) by mouth daily. Vicci Barnie NOVAK, MD  Active Self, Pharmacy Records  polyethylene glycol powder (GLYCOLAX /MIRALAX ) 17 GM/SCOOP powder [id number] Yes Dissolve 1 capful (17g) in 4-8 ounces of liquid and take by mouth daily. Jillian Buttery, MD  Active   potassium chloride  SA (KLOR-CON  M) 20 MEQ tablet [id number] Yes Take 2 tablets (40 mEq total) by mouth 2 (two)  times daily. Take an extra 20 mEq when you take Metolazone . Sigdel, Santosh, MD  Active Self, Pharmacy Records  prednisoLONE  acetate (PRED FORTE ) 1 % ophthalmic suspension [id number] Yes Place 1 drop into the left eye at bedtime. [provider]  Active Self, Pharmacy Records  rivaroxaban  (XARELTO ) 20 MG TABS tablet [id number] Yes Take 1 tablet (20 mg total) by mouth daily with supper. Vicci Barnie NOVAK, MD  Active Self, Pharmacy Records  sertraline  (ZOLOFT ) 50 MG tablet [id number] Yes Take 1.5 tablets (75 mg total) by mouth daily. Carvin Arvella HERO, MD  Active Self, Pharmacy Records  SYMBICORT  160-4.5 MCG/ACT inhaler [id number] Yes Inhale 2 puffs into the lungs in the morning and at bedtime. [provider]  Active Self, Pharmacy Records  tamsulosin  (FLOMAX ) 0.4 MG CAPS capsule [id number] Yes Take 2 capsules (0.8 mg total) by mouth daily. Vicci Barnie NOVAK, MD  Active Self, Pharmacy Records  torsemide  (DEMADEX ) 20 MG tablet [id number] Yes Take 3 tablets (60 mg total) by mouth daily. Lee, Jordan, NP  Active Self, Pharmacy Records            Home Care and Equipment/Supplies: Were Home Health Services Ordered?: No (Patient declined [facility] Any new equipment  or medical supplies ordered?: No  Functional Questionnaire: Do you need assistance with bathing/showering or dressing?: Yes Do you need assistance with meal preparation?: Yes Do you need assistance with eating?: No Do you have difficulty maintaining continence: No Do you need assistance with getting out of bed/getting out of a chair/moving?: No Do you have difficulty managing or taking your medications?: No  Follow up appointments reviewed: PCP Follow-up appointment confirmed?: Yes Date of PCP follow-up appointment?: 06/30/24 Follow-up Provider: Ronal Jenkins Houseman, NP Specialist Hospital Follow-up appointment confirmed?: No Reason Specialist Follow-Up Not Confirmed: Patient has Specialist Provider Number and will Call for  Appointment Do you need transportation to your follow-up appointment?: No Do you understand care options if your condition(s) worsen?: Yes-patient verbalized understanding  SDOH Interventions Today    Flowsheet Row Most Recent Value  SDOH Interventions   Food Insecurity Interventions Intervention Not Indicated  Housing Interventions Intervention Not Indicated  Transportation Interventions Intervention Not Indicated  Utilities Interventions Other (Comment)  [Patient has connected with Social Services and is awaiting on a call back.]    Andrea Lucky PEAK, BSN [employer]  [facility] RN Care Manager [phone]  "

## 2024-06-26 ENCOUNTER — Other Ambulatory Visit: Payer: Self-pay | Admitting: Internal Medicine

## 2024-06-26 ENCOUNTER — Encounter: Payer: Self-pay | Admitting: Internal Medicine

## 2024-06-27 ENCOUNTER — Encounter: Payer: Self-pay | Admitting: Podiatry

## 2024-06-27 ENCOUNTER — Other Ambulatory Visit: Payer: Self-pay | Admitting: Internal Medicine

## 2024-06-27 ENCOUNTER — Ambulatory Visit: Admitting: Podiatry

## 2024-06-27 ENCOUNTER — Ambulatory Visit

## 2024-06-27 DIAGNOSIS — M7752 Other enthesopathy of left foot: Secondary | ICD-10-CM

## 2024-06-27 DIAGNOSIS — M7751 Other enthesopathy of right foot: Secondary | ICD-10-CM | POA: Diagnosis not present

## 2024-06-27 DIAGNOSIS — M79675 Pain in left toe(s): Secondary | ICD-10-CM | POA: Diagnosis not present

## 2024-06-27 DIAGNOSIS — B351 Tinea unguium: Secondary | ICD-10-CM | POA: Diagnosis not present

## 2024-06-27 DIAGNOSIS — M79674 Pain in right toe(s): Secondary | ICD-10-CM | POA: Diagnosis not present

## 2024-06-27 DIAGNOSIS — E1142 Type 2 diabetes mellitus with diabetic polyneuropathy: Secondary | ICD-10-CM

## 2024-06-27 MED ORDER — AMIODARONE HCL 200 MG PO TABS: 200.0000 mg | ORAL_TABLET | Freq: Every day | ORAL | 5 refills | Status: DC

## 2024-06-27 NOTE — Patient Instructions (Signed)
 Look for felt or foam callus pads, unmedicated to help pad around the bony prominences on the top and outer parts of your foot

## 2024-06-27 NOTE — Progress Notes (Unsigned)
 Nails Poor A1c Good pulses Padded around dorsal lateral boney prominence 5th met base

## 2024-06-28 ENCOUNTER — Other Ambulatory Visit: Payer: Self-pay | Admitting: Podiatry

## 2024-06-28 DIAGNOSIS — M7751 Other enthesopathy of right foot: Secondary | ICD-10-CM

## 2024-06-28 DIAGNOSIS — M7752 Other enthesopathy of left foot: Secondary | ICD-10-CM

## 2024-06-28 DIAGNOSIS — B351 Tinea unguium: Secondary | ICD-10-CM

## 2024-06-28 DIAGNOSIS — E1142 Type 2 diabetes mellitus with diabetic polyneuropathy: Secondary | ICD-10-CM

## 2024-06-29 ENCOUNTER — Telehealth: Payer: Self-pay | Admitting: Internal Medicine

## 2024-06-29 ENCOUNTER — Telehealth: Payer: Self-pay

## 2024-06-29 DIAGNOSIS — I472 Ventricular tachycardia, unspecified: Secondary | ICD-10-CM

## 2024-06-29 DIAGNOSIS — I493 Ventricular premature depolarization: Secondary | ICD-10-CM

## 2024-06-29 NOTE — Telephone Encounter (Addendum)
 Reviewed patient's transmission: he has been sx with palpitations and lightheadedness for a couple of days.   Normal device function No Episodes, No VT events.  Presenting rhythm does show frequent PVC's.   Spoke with patient.  He states he has been out of his amiodarone  and magnesium  in recent days.  Rx has been sent in to [facility] [address] by Dr. Barnie Louder for #30 , 5 refills.  He denies any other changes to diet, health or medications.   Symptoms reported: occasional dizziness, lightheadedness and palpitations. Feels like he could almost pass out at times but hasn't.  Denies chest pain or SOB.   I offered patient 8am appointment tomorrow with A. Tillery, PA-C.  Patient refused stated it was too early.  He does see his PCP tomorrow afternoon at 210 pm to follow up after recent hospitalization (06/15/24) for syncope and chest pain.   Patient instructed to start back on amio/magnesium  ASAP.  He is willing to come by lab tomorrow if blood work is needed.   Also, recommend he set up follow up with EP post hospitalization.  ER precautions given if symptoms worsen, patient verbalizes understanding.   Forwarding to covering provider if anything further needed at this time:

## 2024-06-29 NOTE — Telephone Encounter (Signed)
 Contacted pt to confirmed appt (per vr)

## 2024-06-29 NOTE — Telephone Encounter (Signed)
 Received call from patient.  He reports his chest feels funny and feeling like he could faint.  He has not felt well in the last couple of days. He would like to have fluid levels reviewed on remote transmission.  Advised to send remote transmission for review and will call back with results.  Advised to use 911 if needed.

## 2024-06-29 NOTE — Telephone Encounter (Signed)
 Remote Transmission received and reviewed.  He reports he is not sure if there is something going on with his lungs or heart.  He is wearing his O2 now.  He is having chest palpitations off and on since this morning.  He reports compliance with meds and has not missed any dosages.   Advised will have device clinic triage review report and will be notified any abnormalities are seen.  Advised in the mean time, he should dial 911 if sx persist or worsen and he agreed.    CorVue thoracic impedance suggesting possible dryness starting 06/21/2024 and returned to baseline 06/27/2024.

## 2024-06-30 ENCOUNTER — Other Ambulatory Visit: Payer: Self-pay | Admitting: *Deleted

## 2024-06-30 ENCOUNTER — Ambulatory Visit: Payer: Self-pay | Attending: *Deleted | Admitting: *Deleted

## 2024-06-30 ENCOUNTER — Encounter: Payer: Self-pay | Admitting: *Deleted

## 2024-06-30 VITALS — BP 106/70 | HR 99 | Ht 73.0 in | Wt 272.6 lb

## 2024-06-30 DIAGNOSIS — M19011 Primary osteoarthritis, right shoulder: Secondary | ICD-10-CM

## 2024-06-30 DIAGNOSIS — L750 Bromhidrosis: Secondary | ICD-10-CM

## 2024-06-30 LAB — COMPREHENSIVE METABOLIC PANEL WITH GFR
ALT: 30 IU/L (ref 0–44)
AST: 23 IU/L (ref 0–40)
Albumin: 4.4 g/dL (ref 3.8–4.9)
Alkaline Phosphatase: 83 IU/L (ref 47–123)
BUN/Creatinine Ratio: 13 (ref 9–20)
BUN: 41 mg/dL — ABNORMAL HIGH (ref 6–24)
Bilirubin Total: 0.5 mg/dL (ref 0.0–1.2)
CO2: 24 mmol/L (ref 20–29)
Calcium: 10.1 mg/dL (ref 8.7–10.2)
Chloride: 92 mmol/L — ABNORMAL LOW (ref 96–106)
Creatinine, Ser: 3.27 mg/dL — ABNORMAL HIGH (ref 0.76–1.27)
Globulin, Total: 3 g/dL (ref 1.5–4.5)
Glucose: 192 mg/dL — ABNORMAL HIGH (ref 70–99)
Potassium: 3.2 mmol/L — ABNORMAL LOW (ref 3.5–5.2)
Sodium: 139 mmol/L (ref 134–144)
Total Protein: 7.4 g/dL (ref 6.0–8.5)
eGFR: 21 mL/min/1.73 — ABNORMAL LOW

## 2024-06-30 LAB — MAGNESIUM: Magnesium: 1.7 mg/dL (ref 1.6–2.3)

## 2024-06-30 MED ORDER — OXYCODONE HCL 10 MG PO TABA: 10.0000 | ORAL_TABLET | Freq: Two times a day (BID) | ORAL | 0 refills | Status: DC

## 2024-06-30 NOTE — Telephone Encounter (Signed)
 Spoke with patient. He is coming this afternoon for lab work. He is scheduled to see Jodie this Monday 1/26 at 1140am if the weather permits.   Has ER precautions if symptoms get worse.  Does say he thinks he feels some better this morning.

## 2024-06-30 NOTE — Progress Notes (Signed)
 "   Patient ID: [name], male    DOB: 1965-07-13  MRN: [id number]  CC: Hospitalization Follow-up (Strange odor in groin area/Right shoulder pain/Back pain)   Subjective: [name] is a 59 y.o. male who presents for  Hospital follow-up for chest pain radiating to the arm and severe right shoulder pain and back pain. He was hospitalized from 1/4- 06/17/2024.  He is a 59 year old male with history of systolic CHF, ICD placement, V. tach, V-fib, OSA not on CPAP, diabetes type 2, GERD, and hypertension.  During hospitalization cardiology was consulted.  ICD interrogated finding no abnormalities.  Hospital course remarkable for AKI and CKD stage IIIb: Baseline creatinine 2.5 followed by Dr. Jerrye at [facility]. He takes torsemide  and metolazone  at home.  He was given IV Lasix  while hospitalized.  Kidney function currently at baseline.  Torsemide  resumed  Worsening back pain and shoulder pain:  Right shoulder CT showed advanced glenohumeral osteoarthritis.  He was placed on oxycodone  10 mg and gabapentin  300 mg 3 times daily.  He cannot tolerate the gabapentin  due to oversedation (causes nocturnal enuresis).  He reports lidocaine  patch is not effective for pain relief.   He has a follow-up appointment with an orthopedist on February 6 and is requesting a refill of oxycodone  as his pain is disabling (causing difficulties walking, sleeping and completing ADLs)  History of V. tach: ICD in place.  This is being followed by cardiology.  He reports that his cardiologist has ordered some labs that he needs to go get.  Chronic systolic congestive heart failure (followed by cardiology): Found to be mildly overloaded on admission to the hospital with elevated BNP.  Treated with IV Lasix  while hospitalized.  Torsemide  home dose was restarted.  He was discharged in a euvolemic state  Asthma/COPD:  He was discharged on 2 L of oxygen  per nasal cannula.  He qualified for home program.  He was not on  oxygen  before this admission.  History of DVT of the left upper extremity: On Xarelto .  No complaints of bleeding or recurrence of symptoms suggesting clot formation.  (Swelling, pain, tenderness, warmth, redness or discoloration of the arm)  Type 2 diabetes, insulin -dependent: At discharge he was told to continue his current insulin  regimen and to monitor his blood sugars.  It was felt that his elevated blood sugars were likely to being on steroid burst (for his shoulder pain).  The steroids were discontinued.  He denies polyuria, polyphasia and polydipsia.  He does struggle to adhere to a low carbohydrate diet and exercise daily  Groin odor: He reported groin odor while in the hospital and was treated with nystatin  powder.  He reports that helped some.  He was told to use antibacterial soap.  He also finds this helpful.  History of BPH: He remains on Flomax  with no symptoms of concern  Pertinent past medical history today include: History of systolic CHF, ICD placement, V. tach, V-fib, OSA not on CPAP, diabetes (insulin -dependent), GERD, hypertension.  History of DVT of the left upper extremity.  History of BPH.  Asthma/COPD.  Advanced glenohumeral osteoarthritis of the right shoulder requiring pain management    Patient Active Problem List   Diagnosis Date Noted   Chest pain 06/14/2024   Shoulder pain 06/13/2024   Decreased urine output 06/13/2024   Acute respiratory failure with hypoxia (HCC) 12/20/2023   COPD with acute exacerbation (HCC) 12/19/2023   Acute exacerbation of CHF (congestive heart failure) (HCC) 12/19/2023   ARF (acute renal  failure) 10/08/2023   Acute gout 10/08/2023   Anemia 10/08/2023   Acute renal failure (ARF) 10/08/2023   Hypotension 09/07/2023   Acute deep vein thrombosis (DVT) of left upper extremity (HCC) 04/24/2023   Cellulitis of perineum 04/02/2023   Heart failure with mildly reduced ejection fraction (HFmrEF, 41-49%) (HCC) 02/28/2023   Class 3 obesity  (HCC) 02/28/2023   SOB (shortness of breath) 02/27/2023   Loud snoring 10/28/2022   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   Hyperlipidemia associated with type 2 diabetes mellitus (HCC) 09/18/2022   PTSD (post-traumatic stress disorder) 07/10/2022   Panic attack 06/17/2022   Passive suicidal ideations 06/17/2022   History of ventricular tachycardia 06/14/2022   Essential hypertension 06/14/2022   Stage 3b chronic kidney disease (HCC) 05/15/2022   Acute gout due to renal impairment involving left ankle 05/07/2022   Acute gout due to renal impairment involving right knee 05/07/2022   Hyponatremia 05/07/2022   Diarrhea 05/06/2022   Hyperglycemia 05/06/2022   Cellulitis of buttock 05/06/2022   Acute on chronic combined systolic and diastolic CHF (congestive heart failure) (HCC) 05/06/2022   Acute renal failure superimposed on stage 3b chronic kidney disease (HCC) 05/06/2022   Hypokalemia 05/05/2022   Rectal abscess 05/05/2022   Left buttock abscess 05/05/2022   Cardiac arrest (HCC) 05/03/2022   AKI (acute kidney injury) 05/03/2022   VT (ventricular tachycardia) (HCC) 05/03/2022   CKD (chronic kidney disease) stage 2, GFR 60-89 ml/min 10/24/2019   Acute non-recurrent frontal sinusitis 06/27/2019   COVID-19 virus infection 06/27/2019   Glaucoma suspect 12/22/2018   Seasonal and perennial allergic rhinoconjunctivitis 10/25/2018   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   Urticaria 05/03/2018   S/P inguinal hernia repair 03/01/2018   Unilateral inguinal hernia without obstruction or gangrene 07/24/2017   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   Hypersomnia 02/28/2016   Abnormal PFT 02/24/2016   Dyspnea 01/28/2016   Diabetic polyneuropathy associated with type 2 diabetes mellitus (HCC) 12/18/2015   Lumbar back pain 12/18/2015   Type 2 diabetes mellitus with hyperlipidemia (HCC) 09/17/2015   Left median nerve neuropathy 04/16/2015   Lesion of  right median nerve at forearm 04/16/2015   ICD (implantable cardioverter-defibrillator) in place 03/28/2014   Chronic systolic congestive heart failure (HCC) 03/28/2014   Nonischemic cardiomyopathy (HCC) 11/09/2013   Syncope 10/20/2013   Chest pain radiating to arm 07/28/2013   RENAL CALCULUS 05/13/2009   Obesity 05/08/2009   Former heavy cigarette smoker (20-39 per day) 05/08/2009   HOARSENESS 05/08/2009   GAD (generalized anxiety disorder) 02/28/2008   ASTHMATIC BRONCHITIS, ACUTE 02/28/2008   GERD 02/28/2008   IRRITABLE BOWEL SYNDROME 02/28/2008     Medications Ordered Prior to Encounter[1]  Allergies[2]  Social History   Socioeconomic History   Marital status: Single    Spouse name: Not on file   Number of children: Not on file   Years of education: Not on file   Highest education level: Not on file  Occupational History   Occupation: Disabled  Tobacco Use   Smoking status: Former    Current packs/day: 0.00    Average packs/day: 1 pack/day for 28.0 years (28.0 ttl pk-yrs)    Types: Cigarettes    Start date: 06/03/1986    Quit date: 06/03/2014    Years since quitting: 10.0   Smokeless tobacco: Never  Vaping Use   Vaping status: Never Used  Substance and Sexual Activity   Alcohol use: Not Currently   Drug use: Yes    Types:  Marijuana    Comment: occasionally;   Sexual activity: Yes  Other Topics Concern   Not on file  Social History Narrative   Pt lives with his brother    Social Drivers of Health   Tobacco Use: Medium Risk (06/27/2024)   Patient History    Smoking Tobacco Use: Former    Smokeless Tobacco Use: Never    Passive Exposure: Not on file  Financial Resource Strain: Low Risk (05/25/2023)   Overall Financial Resource Strain (CARDIA)    Difficulty of Paying Living Expenses: Not hard at all  Recent Concern: Financial Resource Strain - High Risk (03/24/2023)   Overall Financial Resource Strain (CARDIA)    Difficulty of Paying Living Expenses: Hard   Food Insecurity: No Food Insecurity (06/20/2024)   Epic    Worried About Programme Researcher, Broadcasting/film/video in the Last Year: Never true    Ran Out of Food in the Last Year: Never true  Transportation Needs: No Transportation Needs (06/20/2024)   Epic    Lack of Transportation (Medical): No    Lack of Transportation (Non-Medical): No  Physical Activity: Inactive (05/25/2023)   Exercise Vital Sign    Days of Exercise per Week: 0 days    Minutes of Exercise per Session: 0 min  Stress: Stress Concern Present (05/25/2023)   [employer] of Occupational Health - Occupational Stress Questionnaire    Feeling of Stress : Rather much  Social Connections: Moderately Isolated (03/17/2024)   Social Connection and Isolation Panel    Frequency of Communication with Friends and Family: More than three times a week    Frequency of Social Gatherings with Friends and Family: More than three times a week    Attends Religious Services: More than 4 times per year    Active Member of [employer] or Organizations: No    Attends Banker Meetings: Never    Marital Status: Never married  Intimate Partner Violence: Not At Risk (06/20/2024)   Epic    Fear of Current or Ex-Partner: No    Emotionally Abused: No    Physically Abused: No    Sexually Abused: No  Depression (PHQ2-9): Low Risk (06/20/2024)   Depression (PHQ2-9)    PHQ-2 Score: 0  Alcohol Screen: Low Risk (05/25/2023)   Alcohol Screen    Last Alcohol Screening Score (AUDIT): 0  Housing: Unknown (06/20/2024)   Epic    Unable to Pay for Housing in the Last Year: No    Number of Times Moved in the Last Year: Not on file    Homeless in the Last Year: No  Utilities: At Risk (06/20/2024)   Epic    Threatened with loss of utilities: Yes  Health Literacy: Adequate Health Literacy (05/25/2023)   B1300 Health Literacy    Frequency of need for help with medical instructions: Never    Family History  Problem Relation Age of Onset   Diabetes Mother     Arthritis Mother    Hypertension Mother    Diabetes Father    Arthritis Father    Hypertension Father    Hypertension Brother    Mental illness Brother    Parkinson's disease Paternal Grandfather     Past Surgical History:  Procedure Laterality Date   CARDIAC CATHETERIZATION  08/10/2013   CHOLECYSTECTOMY  ~ 2010   IMPLANTABLE CARDIOVERTER DEFIBRILLATOR IMPLANT  12/23/2013   STJ Fortify ICD implanted by Dr Kelsie for cardiomyopathy and syncope   IMPLANTABLE CARDIOVERTER DEFIBRILLATOR IMPLANT N/A 12/23/2013   Procedure: IMPLANTABLE  CARDIOVERTER DEFIBRILLATOR IMPLANT;  Surgeon: Lynwood JONETTA Rakers, MD;  Location: [facility] CATH LAB;  Service: Cardiovascular;  Laterality: N/A;   INGUINAL HERNIA REPAIR Left 03/01/2018   Procedure: OPEN REPAIR OF LEFT INGUINAL HERNIA WITH MESH;  Surgeon: Tanda Locus, MD;  Location: [facility] ORS;  Service: General;  Laterality: Left;   LEAD REVISION/REPAIR N/A 05/15/2022   Procedure: LEAD REVISION/REPAIR;  Surgeon: [name] Ole DASEN, MD;  Location: [facility] INVASIVE CV LAB;  Service: Cardiovascular;  Laterality: N/A;   LEFT HEART CATHETERIZATION WITH CORONARY ANGIOGRAM N/A 08/10/2013   Procedure: LEFT HEART CATHETERIZATION WITH CORONARY ANGIOGRAM;  Surgeon: Candyce GORMAN Reek, MD;  Location: [facility] CATH LAB;  Service: Cardiovascular;  Laterality: N/A;   ORIF HUMERUS FRACTURE Left 08/12/2013   Procedure: OPEN REDUCTION INTERNAL FIXATION (ORIF) HUMERAL SHAFT FRACTURE;  Surgeon: Jerona LULLA Sage, MD;  Location: [facility] OR;  Service: Orthopedics;  Laterality: Left;  Open Reduction Internal Fixation Left Humerus   RIGHT/LEFT HEART CATH AND CORONARY ANGIOGRAPHY N/A 05/12/2022   Procedure: RIGHT/LEFT HEART CATH AND CORONARY ANGIOGRAPHY;  Surgeon: Gardenia Led, DO;  Location: [facility] INVASIVE CV LAB;  Service: Cardiovascular;  Laterality: N/A;   URETEROSCOPY     laser for kidney stones    ROS: Review of Systems Negative except as stated above  PHYSICAL EXAM: BP 106/70   Pulse 99   Ht 6' 1  (1.854 m)   Wt 123.7 kg   SpO2 96%   BMI 35.97 kg/m   Physical Exam Vitals and nursing note reviewed.  Cardiovascular:     Rate and Rhythm: Normal rate and regular rhythm.  Pulmonary:     Effort: Pulmonary effort is normal.     Breath sounds: Normal breath sounds.  Abdominal:     General: Abdomen is flat. Bowel sounds are normal.     Palpations: Abdomen is soft.  Musculoskeletal:     Comments:  decreased range of motion in the right shoulder due to pain. Pain with forward and lateral flexion.  Negative straight leg raise.  Skin:    General: Skin is warm and dry.  Neurological:     Mental Status: He is alert. Mental status is at baseline.     Comments: Normal cranial nerves II through XII with good reflexes, sensation and motor function in left upper extremity and lower extremities.  Exam of right upper extremity normal except for decreased range of motion in shoulder          Latest Ref Rng & Units 06/16/2024    9:46 AM 06/15/2024    2:57 AM 06/14/2024    3:10 AM  CMP  Glucose 70 - 99 mg/dL 697  727  799   BUN 6 - 20 mg/dL 52  54  49   Creatinine 0.61 - 1.24 mg/dL 7.52  7.30  6.87   Sodium 135 - 145 mmol/L 135  137  137   Potassium 3.5 - 5.1 mmol/L 3.8  3.4  3.4   Chloride 98 - 111 mmol/L 93  92  92   CO2 22 - 32 mmol/L 26  33  32   Calcium  8.9 - 10.3 mg/dL 9.8  9.4  9.4   Total Protein 6.5 - 8.1 g/dL   7.4   Total Bilirubin 0.0 - 1.2 mg/dL   0.6   Alkaline Phos 38 - 126 U/L   88   AST 15 - 41 U/L   27   ALT 0 - 44 U/L   45    Lipid Panel  Component Value Date/Time   CHOL 166 02/13/2023 1658   TRIG 283 (H) 02/13/2023 1658   HDL 44 02/13/2023 1658   CHOLHDL 3.8 02/13/2023 1658   CHOLHDL 4 05/08/2009 1040   VLDL 27.0 05/08/2009 1040   LDLCALC 76 02/13/2023 1658    CBC    Component Value Date/Time   WBC 9.2 06/14/2024 0310   RBC 4.21 (L) 06/14/2024 0310   HGB 12.5 (L) 06/14/2024 0310   HGB 13.0 03/28/2024 1632   HCT 37.7 (L) 06/14/2024 0310   HCT 38.5  03/28/2024 1632   PLT 240 06/14/2024 0310   PLT 409 03/28/2024 1632   MCV 89.5 06/14/2024 0310   MCV 89 03/28/2024 1632   MCH 29.7 06/14/2024 0310   MCHC 33.2 06/14/2024 0310   RDW 14.6 06/14/2024 0310   RDW 13.2 03/28/2024 1632   LYMPHSABS 1.7 06/12/2024 1311   LYMPHSABS 1.7 06/28/2018 1440   MONOABS 0.9 06/12/2024 1311   EOSABS 0.0 06/12/2024 1311   EOSABS 0.3 06/28/2018 1440   BASOSABS 0.1 06/12/2024 1311   BASOSABS 0.0 06/28/2018 1440    Results for orders placed or performed during the hospital encounter of 06/12/24  Magnesium    Collection Time: 06/12/24  1:11 PM  Result Value Ref Range   Magnesium  1.7 1.7 - 2.4 mg/dL  CBC with Differential/Platelet   Collection Time: 06/12/24  1:11 PM  Result Value Ref Range   WBC 9.8 4.0 - 10.5 K/uL   RBC 4.68 4.22 - 5.81 MIL/uL   Hemoglobin 14.2 13.0 - 17.0 g/dL   HCT 58.0 60.9 - 47.9 %   MCV 89.5 80.0 - 100.0 fL   MCH 30.3 26.0 - 34.0 pg   MCHC 33.9 30.0 - 36.0 g/dL   RDW 85.1 88.4 - 84.4 %   Platelets 281 150 - 400 K/uL   nRBC 0.0 0.0 - 0.2 %   Neutrophils Relative % 72 %   Neutro Abs 7.1 1.7 - 7.7 K/uL   Lymphocytes Relative 17 %   Lymphs Abs 1.7 0.7 - 4.0 K/uL   Monocytes Relative 9 %   Monocytes Absolute 0.9 0.1 - 1.0 K/uL   Eosinophils Relative 0 %   Eosinophils Absolute 0.0 0.0 - 0.5 K/uL   Basophils Relative 1 %   Basophils Absolute 0.1 0.0 - 0.1 K/uL   Immature Granulocytes 1 %   Abs Immature Granulocytes 0.07 0.00 - 0.07 K/uL  Pro Brain natriuretic peptide (order if patient c/o SOB ONLY)   Collection Time: 06/12/24  1:11 PM  Result Value Ref Range   Pro Brain Natriuretic Peptide 226.0 <300.0 pg/mL  Hemoglobin A1c   Collection Time: 06/12/24  1:11 PM  Result Value Ref Range   Hgb A1c MFr Bld 9.1 (H) 4.8 - 5.6 %   Mean Plasma Glucose 214.47 mg/dL  Troponin T, High Sensitivity   Collection Time: 06/12/24  3:00 PM  Result Value Ref Range   Troponin T High Sensitivity 31 (H) 0 - 19 ng/L  Basic metabolic panel  with GFR   Collection Time: 06/12/24  3:56 PM  Result Value Ref Range   Sodium 138 135 - 145 mmol/L   Potassium 3.5 3.5 - 5.1 mmol/L   Chloride 93 (L) 98 - 111 mmol/L   CO2 30 22 - 32 mmol/L   Glucose, Bld 251 (H) 70 - 99 mg/dL   BUN 40 (H) 6 - 20 mg/dL   Creatinine, Ser 6.99 (H) 0.61 - 1.24 mg/dL   Calcium  9.8 8.9 - 10.3  mg/dL   GFR, Estimated 23 (L) >60 mL/min   Anion gap 15 5 - 15  Hepatic function panel   Collection Time: 06/12/24  3:56 PM  Result Value Ref Range   Total Protein 8.1 6.5 - 8.1 g/dL   Albumin  4.4 3.5 - 5.0 g/dL   AST 30 15 - 41 U/L   ALT 44 0 - 44 U/L   Alkaline Phosphatase 79 38 - 126 U/L   Total Bilirubin 0.7 0.0 - 1.2 mg/dL   Bilirubin, Direct 0.3 (H) 0.0 - 0.2 mg/dL   Indirect Bilirubin 0.4 0.3 - 0.9 mg/dL  Lipase, blood   Collection Time: 06/12/24  3:56 PM  Result Value Ref Range   Lipase 27 11 - 51 U/L  Troponin T, High Sensitivity   Collection Time: 06/12/24  3:56 PM  Result Value Ref Range   Troponin T High Sensitivity 38 (H) 0 - 19 ng/L  Blood gas, venous   Collection Time: 06/13/24 12:47 AM  Result Value Ref Range   pH, Ven 7.3 7.25 - 7.43   pCO2, Ven 61 (H) 44 - 60 mmHg   pO2, Ven 50 (H) 32 - 45 mmHg   Bicarbonate 30.0 (H) 20.0 - 28.0 mmol/L   Acid-Base Excess 2.0 0.0 - 2.0 mmol/L   O2 Saturation 78.6 %   Patient temperature 36.9   Troponin T, High Sensitivity   Collection Time: 06/13/24 12:47 AM  Result Value Ref Range   Troponin T High Sensitivity 39 (H) 0 - 19 ng/L  Urinalysis, Routine w reflex microscopic -Urine, Clean Catch   Collection Time: 06/13/24  3:42 AM  Result Value Ref Range   Color, Urine YELLOW YELLOW   APPearance CLEAR CLEAR   Specific Gravity, Urine 1.013 1.005 - 1.030   pH 5.0 5.0 - 8.0   Glucose, UA 150 (A) NEGATIVE mg/dL   Hgb urine dipstick NEGATIVE NEGATIVE   Bilirubin Urine NEGATIVE NEGATIVE   Ketones, ur NEGATIVE NEGATIVE mg/dL   Protein, ur NEGATIVE NEGATIVE mg/dL   Nitrite NEGATIVE NEGATIVE    Leukocytes,Ua NEGATIVE NEGATIVE  CBG monitoring, ED   Collection Time: 06/13/24  4:10 AM  Result Value Ref Range   Glucose-Capillary 364 (H) 70 - 99 mg/dL  Troponin T, High Sensitivity   Collection Time: 06/13/24  5:05 AM  Result Value Ref Range   Troponin T High Sensitivity 40 (H) 0 - 19 ng/L  CBG monitoring, ED   Collection Time: 06/13/24  8:42 AM  Result Value Ref Range   Glucose-Capillary 223 (H) 70 - 99 mg/dL  ECHOCARDIOGRAM COMPLETE   Collection Time: 06/13/24 12:25 PM  Result Value Ref Range   Weight 4,304 oz   Height 73 in   BP 118/74 mmHg   S' Lateral 5.10 cm   Area-P 1/2 4.60 cm2   Est EF 45 - 50%   CBG monitoring, ED   Collection Time: 06/13/24  1:22 PM  Result Value Ref Range   Glucose-Capillary 250 (H) 70 - 99 mg/dL  Glucose, capillary   Collection Time: 06/13/24  4:03 PM  Result Value Ref Range   Glucose-Capillary 381 (H) 70 - 99 mg/dL  Glucose, capillary   Collection Time: 06/13/24  9:31 PM  Result Value Ref Range   Glucose-Capillary 436 (H) 70 - 99 mg/dL   Comment 1 Notify RN    Comment 2 Document in Chart   Glucose, capillary   Collection Time: 06/14/24 12:22 AM  Result Value Ref Range   Glucose-Capillary 318 (H) 70 -  99 mg/dL   Comment 1 Notify RN    Comment 2 Document in Chart   CBC   Collection Time: 06/14/24  3:10 AM  Result Value Ref Range   WBC 9.2 4.0 - 10.5 K/uL   RBC 4.21 (L) 4.22 - 5.81 MIL/uL   Hemoglobin 12.5 (L) 13.0 - 17.0 g/dL   HCT 62.2 (L) 60.9 - 47.9 %   MCV 89.5 80.0 - 100.0 fL   MCH 29.7 26.0 - 34.0 pg   MCHC 33.2 30.0 - 36.0 g/dL   RDW 85.3 88.4 - 84.4 %   Platelets 240 150 - 400 K/uL   nRBC 0.0 0.0 - 0.2 %  Comprehensive metabolic panel   Collection Time: 06/14/24  3:10 AM  Result Value Ref Range   Sodium 137 135 - 145 mmol/L   Potassium 3.4 (L) 3.5 - 5.1 mmol/L   Chloride 92 (L) 98 - 111 mmol/L   CO2 32 22 - 32 mmol/L   Glucose, Bld 200 (H) 70 - 99 mg/dL   BUN 49 (H) 6 - 20 mg/dL   Creatinine, Ser 6.87 (H) 0.61 -  1.24 mg/dL   Calcium  9.4 8.9 - 10.3 mg/dL   Total Protein 7.4 6.5 - 8.1 g/dL   Albumin  4.0 3.5 - 5.0 g/dL   AST 27 15 - 41 U/L   ALT 45 (H) 0 - 44 U/L   Alkaline Phosphatase 88 38 - 126 U/L   Total Bilirubin 0.6 0.0 - 1.2 mg/dL   GFR, Estimated 22 (L) >60 mL/min   Anion gap 13 5 - 15  Magnesium    Collection Time: 06/14/24  3:10 AM  Result Value Ref Range   Magnesium  2.1 1.7 - 2.4 mg/dL  Phosphorus   Collection Time: 06/14/24  3:10 AM  Result Value Ref Range   Phosphorus 3.0 2.5 - 4.6 mg/dL  Pro Brain natriuretic peptide   Collection Time: 06/14/24  3:10 AM  Result Value Ref Range   Pro Brain Natriuretic Peptide 370.0 (H) <300.0 pg/mL  Glucose, capillary   Collection Time: 06/14/24  4:19 AM  Result Value Ref Range   Glucose-Capillary 173 (H) 70 - 99 mg/dL   Comment 1 Notify RN    Comment 2 Document in Chart   Glucose, capillary   Collection Time: 06/14/24  7:15 AM  Result Value Ref Range   Glucose-Capillary 130 (H) 70 - 99 mg/dL  Glucose, capillary   Collection Time: 06/14/24 11:55 AM  Result Value Ref Range   Glucose-Capillary 314 (H) 70 - 99 mg/dL  Glucose, capillary   Collection Time: 06/14/24  3:48 PM  Result Value Ref Range   Glucose-Capillary 420 (H) 70 - 99 mg/dL  Glucose, capillary   Collection Time: 06/14/24  5:20 PM  Result Value Ref Range   Glucose-Capillary 414 (H) 70 - 99 mg/dL  Glucose, capillary   Collection Time: 06/14/24  8:18 PM  Result Value Ref Range   Glucose-Capillary 476 (H) 70 - 99 mg/dL  Glucose, capillary   Collection Time: 06/14/24 11:21 PM  Result Value Ref Range   Glucose-Capillary 334 (H) 70 - 99 mg/dL  Basic metabolic panel with GFR   Collection Time: 06/15/24  2:57 AM  Result Value Ref Range   Sodium 137 135 - 145 mmol/L   Potassium 3.4 (L) 3.5 - 5.1 mmol/L   Chloride 92 (L) 98 - 111 mmol/L   CO2 33 (H) 22 - 32 mmol/L   Glucose, Bld 272 (H) 70 - 99 mg/dL  BUN 54 (H) 6 - 20 mg/dL   Creatinine, Ser 7.30 (H) 0.61 - 1.24 mg/dL    Calcium  9.4 8.9 - 10.3 mg/dL   GFR, Estimated 26 (L) >60 mL/min   Anion gap 12 5 - 15  Glucose, capillary   Collection Time: 06/15/24  4:06 AM  Result Value Ref Range   Glucose-Capillary 253 (H) 70 - 99 mg/dL  Glucose, capillary   Collection Time: 06/15/24  6:47 AM  Result Value Ref Range   Glucose-Capillary 217 (H) 70 - 99 mg/dL  Glucose, capillary   Collection Time: 06/15/24  7:27 AM  Result Value Ref Range   Glucose-Capillary 193 (H) 70 - 99 mg/dL   Comment 1 Notify RN    Comment 2 Document in Chart   Glucose, capillary   Collection Time: 06/15/24 11:38 AM  Result Value Ref Range   Glucose-Capillary 293 (H) 70 - 99 mg/dL  Glucose, capillary   Collection Time: 06/15/24  5:24 PM  Result Value Ref Range   Glucose-Capillary 359 (H) 70 - 99 mg/dL  Glucose, capillary   Collection Time: 06/15/24  8:54 PM  Result Value Ref Range   Glucose-Capillary 379 (H) 70 - 99 mg/dL  Glucose, capillary   Collection Time: 06/16/24  5:43 AM  Result Value Ref Range   Glucose-Capillary 150 (H) 70 - 99 mg/dL  Basic metabolic panel with GFR   Collection Time: 06/16/24  9:46 AM  Result Value Ref Range   Sodium 135 135 - 145 mmol/L   Potassium 3.8 3.5 - 5.1 mmol/L   Chloride 93 (L) 98 - 111 mmol/L   CO2 26 22 - 32 mmol/L   Glucose, Bld 302 (H) 70 - 99 mg/dL   BUN 52 (H) 6 - 20 mg/dL   Creatinine, Ser 7.52 (H) 0.61 - 1.24 mg/dL   Calcium  9.8 8.9 - 10.3 mg/dL   GFR, Estimated 29 (L) >60 mL/min   Anion gap 16 (H) 5 - 15  Glucose, capillary   Collection Time: 06/16/24 11:30 AM  Result Value Ref Range   Glucose-Capillary 285 (H) 70 - 99 mg/dL  Glucose, capillary   Collection Time: 06/16/24  3:47 PM  Result Value Ref Range   Glucose-Capillary 336 (H) 70 - 99 mg/dL  Glucose, capillary   Collection Time: 06/16/24  8:17 PM  Result Value Ref Range   Glucose-Capillary 404 (H) 70 - 99 mg/dL  Glucose, capillary   Collection Time: 06/16/24  9:59 PM  Result Value Ref Range   Glucose-Capillary  360 (H) 70 - 99 mg/dL  Glucose, capillary   Collection Time: 06/17/24 12:16 AM  Result Value Ref Range   Glucose-Capillary 314 (H) 70 - 99 mg/dL  Glucose, capillary   Collection Time: 06/17/24  4:22 AM  Result Value Ref Range   Glucose-Capillary 256 (H) 70 - 99 mg/dL  Glucose, capillary   Collection Time: 06/17/24  7:59 AM  Result Value Ref Range   Glucose-Capillary 180 (H) 70 - 99 mg/dL   *Note: Due to a large number of results and/or encounters for the requested time period, some results have not been displayed. A complete set of results can be found in Results Review.     ASSESSMENT AND PLAN:  Assessment & Plan Arthritis of right shoulder As above he has severe glenohumeral arthritis (bone-on-bone) He has been referred to an orthopedist for further evaluation. He has had difficulty in managing his pain due to intolerance of gabapentin  (nocturnal enuresis) and effectiveness of lidocaine  patch and contraindication  of anti-inflammatories. Due to the significant nature of his pain affecting sleep, movement and completion of ADLs okay for short course of oxycodone  10 twice daily. He is encouraged to use heat or ice whichever is more comfortable. PDMP reviewed today  Orders:   oxyCODONE  HCl 10 MG TABA; Take 10 1e11 Vector Genomes by mouth in the morning and at bedtime for 14 days.  Abnormal body odor As above, he reported a abnormal odor from his groin area without rash.  Nystatin  powder was ordered. He was also encouraged to use antibacterial soap. No exam today due to patient request.  (He reports he just showered and notices no odor at this time) He will continue antibacterial soap and nystatin  powder as needed Plan accordingly  We did discuss his history of systolic CHF with ICD placement, V. 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  Patient verbalized understanding of the plan and was able to repeat key elements of the plan.   This documentation was completed using Paediatric nurse.  Any transcriptional errors are unintentional.     Requested Prescriptions   Signed Prescriptions Disp Refills   oxyCODONE  HCl 10 MG TABA 28 tablet 0    Sig: Take 10 1e11 Vector Genomes by mouth in the morning and at bedtime for 14 days.    Return in about 3 months (around 09/28/2024) for with PCP.  Sylvana Bonk H, NP      [1]  Current Outpatient Medications on File Prior to Visit  Medication Sig Dispense Refill   allopurinol  (ZYLOPRIM ) 100 MG tablet Take 1 tablet (100 mg total) by mouth daily. 30 tablet 2   amiodarone  (PACERONE ) 200 MG tablet Take 1 tablet by mouth once daily 30 tablet 0   amiodarone  (PACERONE ) 200 MG tablet Take 1 tablet (200 mg total) by mouth daily. 30 tablet 5   atorvastatin  (LIPITOR) 10 MG tablet Take 1 tablet (10 mg total) by mouth daily. 90 tablet 2   cetirizine  (ZYRTEC ) 10 MG tablet Take 1 tablet by mouth once daily 90 tablet 0   cholecalciferol  (VITAMIN D3) 25 MCG (1000 UNIT) tablet Take 1,000 Units by mouth in the morning.     clonazePAM  (KLONOPIN ) 0.5 MG tablet Take 1 tablet (0.5 mg total) by mouth 2 (two) times daily. for anxiety 60 tablet 1   Clotrimazole  1 % OINT Apply 1 Application topically 2 (two) times daily as needed (irritation).     colchicine  0.6 MG tablet Take 0.5 tablets (0.3 mg total) by mouth every Monday, Wednesday, and Friday.     Continuous Glucose Sensor (FREESTYLE LIBRE 2 PLUS SENSOR) MISC Change sensor every 15 days. 2 each 11   EPINEPHrine  0.3 mg/0.3 mL IJ SOAJ injection Inject 0.3 mg into the muscle as needed. 1 each 1   fluticasone  (FLONASE ) 50 MCG/ACT nasal spray Place 2 sprays into both nostrils daily. 16 g 6    gabapentin  (NEURONTIN ) 300 MG capsule Take 1 capsule (300 mg total) by mouth 3 (three) times daily. 90 capsule 0   insulin  aspart (NOVOLOG  FLEXPEN) 100 UNIT/ML FlexPen Inject 12 Units into the skin 3 (three) times daily with meals.     insulin  glargine (LANTUS  SOLOSTAR) 100 UNIT/ML Solostar Pen Inject 35 Units  into the skin at bedtime.     Insulin  Pen Needle (PEN NEEDLES) 32G X 4 MM MISC Use to inject insulin  3 times daily. 100 each 6   latanoprost (XALATAN) 0.005 % ophthalmic solution Place 1 drop into both eyes at bedtime.     magnesium  oxide (MAG-OX) 400 MG tablet Take 1 tablet (400 mg total) by mouth daily. 90 tablet 3   methocarbamol  (ROBAXIN ) 500 MG tablet Take 500 mg by mouth daily.     metolazone  (ZAROXOLYN ) 5 MG tablet Take 1 tablet (5 mg total) by mouth once a week. 12 tablet 2   nystatin  (MYCOSTATIN /NYSTOP ) powder Apply 1 Application topically 3 (three) times daily. (Patient taking differently: Apply 1 Application topically 2 (two) times daily.) 60 g 2   pantoprazole  (PROTONIX ) 40 MG tablet Take 1 tablet (40 mg total) by mouth daily. 90 tablet 1   polyethylene glycol powder (GLYCOLAX /MIRALAX ) 17 GM/SCOOP powder Dissolve 1 capful (17g) in 4-8 ounces of liquid and take by mouth daily. 238 g 0   potassium chloride  SA (KLOR-CON  M) 20 MEQ tablet Take 2 tablets (40 mEq total) by mouth 2 (two) times daily. Take an extra 20 mEq when you take Metolazone . 120 tablet 11   prednisoLONE  acetate (PRED FORTE ) 1 % ophthalmic suspension Place 1 drop into the left eye at bedtime.     rivaroxaban  (XARELTO ) 20 MG TABS tablet Take 1 tablet (20 mg total) by mouth daily with supper. 30 tablet 2   sertraline  (ZOLOFT ) 50 MG tablet Take 1.5 tablets (75 mg total) by mouth daily. 45 tablet 2   SYMBICORT  160-4.5 MCG/ACT inhaler Inhale 2 puffs into the lungs in the morning and at bedtime.     tamsulosin  (FLOMAX ) 0.4 MG CAPS capsule Take 2 capsules (0.8 mg total) by mouth daily. 180 capsule 3   torsemide  (DEMADEX ) 20  MG tablet Take 3 tablets (60 mg total) by mouth daily. 180 tablet 3   benzonatate  (TESSALON ) 200 MG capsule Take 200 mg by mouth 2 (two) times daily as needed for cough. (Patient not taking: Reported on 06/30/2024)     No current facility-administered medications on file prior to visit.  [2]  Allergies Allergen Reactions   Bee Venom Anaphylaxis   Trulicity  [Dulaglutide ] Anaphylaxis and Diarrhea    Extra dose of Trulicity  caused cardiac arrest. Patient experience onset of diarrhea with using this medication - cleared after med stopped.   "

## 2024-06-30 NOTE — Patient Instructions (Signed)
 [name] - I am sorry  you where unable to complete outreach today for our scheduled appointment.   I have rescheduled your appointment for 07/13/24 @ 2:30 pm. I work with Vicci Barnie NOVAK, MD and am calling to support your healthcare needs. Please contact me at [phone] at your earliest convenience. I look forward to speaking with you soon.   Thank you,  Donatello Kleve, RN, BSN, ACM RN Care Manager [employer] [phone]

## 2024-06-30 NOTE — Patient Instructions (Addendum)
 Today we discussed your recent hospitalization January 4 to January 9 for chest  pain and worsening back pain right shoulder pain. You reported that you are working with your cardiologist for your persistent chest pain and are going to get labs drawn and follow-up with them as ordered For your right shoulder and back pain that is felt to be due to chronic advanced glenohumeral osteoarthritis you have an appointment with your orthopedist on 6 February. Because your decreased kidney function you cannot tolerate anti-inflammatories.  Steroids caused your blood sugar to be elevated so they were discontinued.  Lidocaine  patch is not affected. You do find that Oxy codon 10 mg twice daily helps your pain. You ask for a refill of that today which will be sent to the pharmacy of your choice. You have a follow-up appointment with Dr. Jerrye at [facility] following your acute kidney injury You are on 2 L of nasal oxygen  at home.  We discussed your groin odor without rash. Nystatin  powder helps and as we discussed you will use an  antibacterial soap and let us  know if the symptoms persist or worsen Per discharge summary you are taking your medications as prescribed. You find your blood sugars been mildly elevated still related to the steroid use.  You have an appointment with your orthopedist as well as your cardiologist Home physical therapy is on hold until you move into your new home. You do have a an appointment with a spine specialist for your back.

## 2024-06-30 NOTE — Telephone Encounter (Signed)
 Noted, blood work orders placed, patient aware will come by clinic today. Working on follow up appointment with EP.

## 2024-06-30 NOTE — Addendum Note (Signed)
 Addended by: GERSHON LORN BROCKS on: 06/30/2024 10:37 AM   Modules accepted: Orders

## 2024-06-30 NOTE — Telephone Encounter (Signed)
 Mandy,   Yes, please get CMP, Mg+ for him.  He should restart amiodarone  as you instructed and magnesium .  If we can get him an afternoon appt for EP follow up that would be great.  Thank you!    Daphne

## 2024-07-01 ENCOUNTER — Encounter: Payer: Self-pay | Admitting: Internal Medicine

## 2024-07-01 ENCOUNTER — Telehealth: Payer: Self-pay

## 2024-07-01 ENCOUNTER — Other Ambulatory Visit: Payer: Self-pay | Admitting: *Deleted

## 2024-07-01 ENCOUNTER — Ambulatory Visit: Payer: Self-pay | Admitting: Pulmonary Disease

## 2024-07-01 ENCOUNTER — Encounter: Payer: Self-pay | Admitting: Family Medicine

## 2024-07-01 DIAGNOSIS — M19011 Primary osteoarthritis, right shoulder: Secondary | ICD-10-CM

## 2024-07-01 MED ORDER — OXYCODONE HCL 10 MG PO TABA: 10.0000 | ORAL_TABLET | Freq: Two times a day (BID) | ORAL | 0 refills | Status: DC

## 2024-07-01 NOTE — Telephone Encounter (Signed)
 Patient has been informed of new script being sent to pharmacy.

## 2024-07-01 NOTE — Telephone Encounter (Signed)
 Copied from CRM #[id number]. Topic: Clinical - Medication Prior Auth >> Jul 01, 2024 10:01 AM Delon DASEN wrote: [name] with [facility] is stating that insurance will only cover 5 day supply of the medication, he needs a new prescription to get this covered, PA would have to address the need to exceed the NME limit- [phone]

## 2024-07-02 ENCOUNTER — Encounter: Payer: Self-pay | Admitting: *Deleted

## 2024-07-04 ENCOUNTER — Ambulatory Visit: Admitting: Student

## 2024-07-04 ENCOUNTER — Other Ambulatory Visit: Payer: Self-pay | Admitting: *Deleted

## 2024-07-04 DIAGNOSIS — M1990 Unspecified osteoarthritis, unspecified site: Secondary | ICD-10-CM

## 2024-07-04 DIAGNOSIS — M19011 Primary osteoarthritis, right shoulder: Secondary | ICD-10-CM

## 2024-07-04 MED ORDER — OXYCODONE HCL 10 MG PO TABA: 1.0000 | ORAL_TABLET | Freq: Two times a day (BID) | ORAL | 0 refills | Status: AC
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
## 2024-07-05 NOTE — Progress Notes (Unsigned)
 "    Cardiology Office Note Date:  07/05/2024  Patient ID:  [name], [name] 1966-03-13, MRN [id number] PCP:  Vicci Barnie NOVAK, MD  Cardiologist:  Dr. Pietro Electrophysiologist: Dr. Kelsie >> Dr. [name]    Chief Complaint:   *** symptoms  History of Present Illness: [name] is a 59 y.o. male with history of  NICM/chronic systolic CHF s/p ICD, normal coronary arteries by cath 2015, GERD, HTN and DM2,  CKD (IV), VT/VF. LUE DVT  Admitted 05/03/22 with VT storm, 22 ICD shocks and 2 ATP, 10 aborted shocks on initial arrival. Felt to be likely in setting of hypokalemia, diarrhea, and infection. However had recurrent VT despite management and correction of both and back on amiodarone  gtt  >>  PO amiodarone  > developed bradycardia and intermittent RV pacing that triggered NSPMVT episodes, ultimately felt to be pause dependent and not pacing triggered. R/LHC looked good, volume stable, no obstructive CAD, LVEF stable 30-35% Underwent device upgrade to a dual chamber device 05/15/22 by Dr. [name]  Subsequently has had difficulty with significant sx of anxiety/panic, sees psychiatry MD next week  Following regularly with AHF and EP teams  Admitted 02/25/24 w/syncope PMVT > VF > got ATP without change in rhythm > folloed by spontaneous termination, also noted NSVTs (polymorphic)  device programing: reduce NID in VT 2 zone to 20 and in the FV zone to 16 to try and avoid syncope  K+ and Mag both low following a GI illness Amiodarone  re-loaded Discharged 9/22  Admitted 10/9, briefly with CP felt to be MSK Admitted 1/5 again, briefly with CP, felt atypical, noncardiac, resumed on low dose Toprol  (previously stopped 2/2 fatigue)  Pt called 1/21 feeling funny, requested a review of device transmission, volume appeared stable. Presenting rhythm was SR with PVCs, no arrhythmias treated or untreated. He reported being out of his amiodarone  and mag Advised to check labs and resume  his medications.  K+ 3.2, mag was 1.7 BUN/Creat 41/3.27 LFTs ok  TODAY *** symptoms *** K+, less diuretic? *** WC is reading  Device information Abbott single chamber ICD implanted 12/23/2013 >> upgraded to dual chamber device (A lead added) 05/15/22  + appropriate tx, VT storm  He is symptomatic (anxiety provoking) w/RV pacing/threshold testing  AAD hx Amiodarone  started Nov 2023  Past Medical History:  Diagnosis Date   AICD (automatic cardioverter/defibrillator) present    Chronic bronchitis (HCC)    get it most q yr (12/23/2013)   Chronic systolic (congestive) heart failure (HCC)    Fracture of left humerus    a. 07/2013.   GERD (gastroesophageal reflux disease)    not at present time   Heart murmur    born w/it    History of renal calculi    Hypertension    Kidney stones    NICM (nonischemic cardiomyopathy) (HCC)    a. 07/2013 Echo: EF 20-25%, diff HK, Gr2 DD, mild MR, mod dil LA/RA. 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    Shockable heart rhythm detected by automated external defibrillator    Syncope    Type II diabetes mellitus (HCC) 2006    Past Surgical History:  Procedure Laterality Date   CARDIAC CATHETERIZATION  08/10/2013   CHOLECYSTECTOMY  ~ 2010   IMPLANTABLE CARDIOVERTER DEFIBRILLATOR IMPLANT  12/23/2013   STJ Fortify ICD implanted by Dr Kelsie for cardiomyopathy and syncope   IMPLANTABLE CARDIOVERTER DEFIBRILLATOR IMPLANT  N/A 12/23/2013   Procedure: IMPLANTABLE CARDIOVERTER DEFIBRILLATOR IMPLANT;  Surgeon: Lynwood JONETTA Rakers, MD;  Location: [facility] CATH LAB;  Service: Cardiovascular;  Laterality: N/A;   INGUINAL HERNIA REPAIR Left 03/01/2018   Procedure: OPEN REPAIR OF LEFT INGUINAL HERNIA WITH MESH;  Surgeon: Tanda Locus, MD;  Location: [facility] ORS;  Service: General;  Laterality: Left;   LEAD REVISION/REPAIR N/A 05/15/2022   Procedure: LEAD REVISION/REPAIR;  Surgeon: [name] Ole DASEN, MD;  Location: [facility] INVASIVE CV LAB;  Service: Cardiovascular;  Laterality: N/A;   LEFT HEART CATHETERIZATION WITH CORONARY ANGIOGRAM N/A 08/10/2013   Procedure: LEFT HEART CATHETERIZATION WITH CORONARY ANGIOGRAM;  Surgeon: Candyce GORMAN Reek, MD;  Location: [facility] CATH LAB;  Service: Cardiovascular;  Laterality: N/A;   ORIF HUMERUS FRACTURE Left 08/12/2013   Procedure: OPEN REDUCTION INTERNAL FIXATION (ORIF) HUMERAL SHAFT FRACTURE;  Surgeon: Jerona LULLA Sage, MD;  Location: [facility] OR;  Service: Orthopedics;  Laterality: Left;  Open Reduction Internal Fixation Left Humerus   RIGHT/LEFT HEART CATH AND CORONARY ANGIOGRAPHY N/A 05/12/2022   Procedure: RIGHT/LEFT HEART CATH AND CORONARY ANGIOGRAPHY;  Surgeon: Gardenia Led, DO;  Location: [facility] INVASIVE CV LAB;  Service: Cardiovascular;  Laterality: N/A;   URETEROSCOPY     laser for kidney stones    Current Outpatient Medications  Medication Sig Dispense Refill   allopurinol  (ZYLOPRIM ) 100 MG tablet Take 1 tablet (100 mg total) by mouth daily. 30 tablet 2   amiodarone  (PACERONE ) 200 MG tablet Take 1 tablet by mouth once daily 30 tablet 0   amiodarone  (PACERONE ) 200 MG tablet Take 1 tablet (200 mg total) by mouth daily. 30 tablet 5   atorvastatin  (LIPITOR) 10 MG tablet Take 1 tablet (10 mg total) by mouth daily. 90 tablet 2   benzonatate  (TESSALON ) 200 MG capsule Take 200 mg by mouth 2 (two) times daily as needed for cough. (Patient not taking: Reported on 06/30/2024)     cetirizine  (ZYRTEC ) 10 MG tablet Take 1 tablet by mouth once daily 90 tablet 0   cholecalciferol  (VITAMIN D3) 25 MCG (1000 UNIT) tablet Take 1,000 Units by mouth in the morning.     clonazePAM  (KLONOPIN ) 0.5 MG tablet Take 1 tablet (0.5 mg total) by mouth 2 (two) times daily. for anxiety 60 tablet 1   Clotrimazole  1 % OINT Apply 1 Application topically 2 (two) times daily as needed (irritation).     colchicine  0.6 MG tablet Take 0.5 tablets (0.3 mg total) by mouth every Monday,  Wednesday, and Friday.     Continuous Glucose Sensor (FREESTYLE LIBRE 2 PLUS SENSOR) MISC Change sensor every 15 days. 2 each 11   EPINEPHrine  0.3 mg/0.3 mL IJ SOAJ injection Inject 0.3 mg into the muscle as needed. 1 each 1   fluticasone  (FLONASE ) 50 MCG/ACT nasal spray Place 2 sprays into both nostrils daily. 16 g 6   gabapentin  (NEURONTIN ) 300 MG capsule Take 1 capsule (300 mg total) by mouth 3 (three) times daily. 90 capsule 0   insulin  aspart (NOVOLOG  FLEXPEN) 100 UNIT/ML FlexPen Inject 12 Units into the skin 3 (three) times daily with meals.     insulin  glargine (LANTUS  SOLOSTAR) 100 UNIT/ML Solostar Pen Inject 35 Units into the skin at bedtime.     Insulin  Pen Needle (PEN NEEDLES) 32G X 4 MM MISC Use to inject insulin  3 times daily. 100 each 6   latanoprost (XALATAN) 0.005 % ophthalmic solution Place 1 drop into both eyes at bedtime.     magnesium  oxide (MAG-OX) 400 MG tablet Take 1  tablet (400 mg total) by mouth daily. 90 tablet 3   methocarbamol  (ROBAXIN ) 500 MG tablet Take 500 mg by mouth daily.     metolazone  (ZAROXOLYN ) 5 MG tablet Take 1 tablet (5 mg total) by mouth once a week. 12 tablet 2   nystatin  (MYCOSTATIN /NYSTOP ) powder Apply 1 Application topically 3 (three) times daily. (Patient taking differently: Apply 1 Application topically 2 (two) times daily.) 60 g 2   oxyCODONE  HCl 10 MG TABA Take 1 tablet by mouth in the morning and at bedtime for 5 days. 10 tablet 0   pantoprazole  (PROTONIX ) 40 MG tablet Take 1 tablet (40 mg total) by mouth daily. 90 tablet 1   polyethylene glycol powder (GLYCOLAX /MIRALAX ) 17 GM/SCOOP powder Dissolve 1 capful (17g) in 4-8 ounces of liquid and take by mouth daily. 238 g 0   potassium chloride  SA (KLOR-CON  M) 20 MEQ tablet Take 2 tablets (40 mEq total) by mouth 2 (two) times daily. Take an extra 20 mEq when you take Metolazone . 120 tablet 11   prednisoLONE  acetate (PRED FORTE ) 1 % ophthalmic suspension Place 1 drop into the left eye at bedtime.      rivaroxaban  (XARELTO ) 20 MG TABS tablet Take 1 tablet (20 mg total) by mouth daily with supper. 30 tablet 2   sertraline  (ZOLOFT ) 50 MG tablet Take 1.5 tablets (75 mg total) by mouth daily. 45 tablet 2   SYMBICORT  160-4.5 MCG/ACT inhaler Inhale 2 puffs into the lungs in the morning and at bedtime.     tamsulosin  (FLOMAX ) 0.4 MG CAPS capsule Take 2 capsules (0.8 mg total) by mouth daily. 180 capsule 3   torsemide  (DEMADEX ) 20 MG tablet Take 3 tablets (60 mg total) by mouth daily. 180 tablet 3   No current facility-administered medications for this visit.    Allergies:   Bee venom and Trulicity  [dulaglutide ]   Social History:  The patient  reports that he quit smoking about 10 years ago. His smoking use included cigarettes. He started smoking about 38 years ago. He has a 28 pack-year smoking history. He has never used smokeless tobacco. He reports that he does not currently use alcohol. He reports current drug use. Drug: Marijuana.   Family History:  The patient's family history includes Arthritis in his father and mother; Diabetes in his father and mother; Hypertension in his brother, father, and mother; Mental illness in his brother; Parkinson's disease in his paternal grandfather.  ROS:  Please see the history of present illness.    All other systems are reviewed and otherwise negative.   PHYSICAL EXAM:  VS:  There were no vitals taken for this visit. BMI: There is no height or weight on file to calculate BMI. Well nourished, well developed, in no acute distress HEENT: normocephalic, atraumatic Neck: no JVD, carotid bruits or masses Cardiac: *** RRR; no significant murmurs, no rubs, or gallops Lungs: *** CTA b/l, no wheezing, rhonchi or rales Abd: soft, nontender MS: no deformity or atrophy Ext: *** no edema Skin: warm and dry, no rash Neuro:  No gross deficits appreciated Psych: euthymic mood, full affect  *** ICD site: is well healed, no signs of infection   EKG:  not done  today  Device interrogation done today and reviewed by myself:  *** Battery and lead measurements are good *** RV pacing is very uncomfortable for him and very anxiety provoking ***   04/26/23: TTE (limited) 1. Left ventricular ejection fraction, by estimation, is 25 to 30%. The  left ventricle has  severely decreased function. The left ventricle  demonstrates global hypokinesis.   Comparison(s): No significant change from prior study.   Conclusion(s)/Recommendation(s): Limited echo to exclude thrombus attached  to ICD lead or LV thrombus. No evidence of thrombus based on images.    08/22/2022: LVEF 35%, normal RV function   05/12/22: R/LHC HEMODYNAMICS: RA:                  1 mmHg (mean) RV:                  22/1-3 mmHg PA:                  23/6 mmHg (12 mean) PCWP:            4 mmHg (mean)                                      Estimated Fick CO/CI   7 L/min, 2.9 L/min/m2                                                 TPG                 8  mmHg                                              PVR                 ~1 Wood Units  PAPi                >5       IMPRESSION: Low pre and post capillary filling pressures.  Normal cardiac output/cardiac index.  Normal PVR & PA mean No obstructive CAD, left dominant w/ large Lcx supplying multiple large marginals.    05/06/22: TTE 1. Left ventricular ejection fraction, by estimation, is 30 to 35%. The  left ventricle has moderately decreased function. The left ventricle  demonstrates global hypokinesis. The left ventricular internal cavity size  was moderately dilated. There is mild   eccentric left ventricular hypertrophy. Left ventricular diastolic  parameters are consistent with Grade I diastolic dysfunction (impaired  relaxation).   2. Right ventricular systolic function is normal. The right ventricular  size is normal. Tricuspid regurgitation signal is inadequate for assessing  PA pressure.   3. Left atrial size was  moderately dilated.   4. The mitral valve is normal in structure. No evidence of mitral valve  regurgitation.   5. The aortic valve is tricuspid. Aortic valve regurgitation is not  visualized. No aortic stenosis is present.   6. Aortic dilatation noted. There is borderline dilatation of the aortic  root, measuring 38 mm.   7. The inferior vena cava is normal in size with greater than 50%  respiratory variability, suggesting right atrial pressure of 3 mmHg.   Comparison(s): No significant change from prior study. Prior images  reviewed side by side.    Recent Labs: 03/28/2024: TSH 1.220 04/21/2024: B Natriuretic Peptide 20.6 06/14/2024: Hemoglobin 12.5; Platelets 240; Pro Brain Natriuretic Peptide 370.0 06/30/2024: ALT 30; BUN 41; Creatinine, Ser 3.27; Magnesium  1.7; Potassium 3.2;  Sodium 139  No results found for requested labs within last 365 days.   Estimated Creatinine Clearance: 33.5 mL/min (A) (by C-G formula based on SCr of 3.27 mg/dL (H)).   Wt Readings from Last 3 Encounters:  06/30/24 272 lb 9.6 oz (123.7 kg)  06/17/24 280 lb 3.3 oz (127.1 kg)  05/23/24 275 lb 6 oz (124.9 kg)     Other studies reviewed: Additional studies/records reviewed today include: summarized above  ASSESSMENT AND PLAN:  ICD *** intact function *** no programming changes made chronic Amiodarone  ***   NICM Chronic CHF (systolic) *** CorVue is ***  *** hx of orthostatic symptoms *** C/w AHF team   5. Anxiety 6. PTSD *** Continues with counseling is helpful    Disposition: remotes as usual, in clinic in ***, sooner if needed      Current medicines are reviewed at length with the patient today.  The patient did not have any concerns regarding medicines.  Bonney Charlies Arthur, PA-C 07/05/2024 1:02 PM     [facility] [address] [place] [address] [phone] (office)  [phone] (fax)  "

## 2024-07-06 ENCOUNTER — Ambulatory Visit: Admitting: Physician Assistant

## 2024-07-07 ENCOUNTER — Encounter (HOSPITAL_COMMUNITY): Payer: Self-pay

## 2024-07-07 ENCOUNTER — Encounter (HOSPITAL_COMMUNITY): Admitting: Psychiatry

## 2024-07-07 NOTE — Progress Notes (Signed)
 This encounter was created in error - please disregard.  Patient did not show up for the appointment.  Appointment reminders were sent to patient's phone and email.  Attempted to call the patient at 2:38 but received no response. Left a HIPAA compliant voicemail for patient to reschedule.

## 2024-07-07 NOTE — Progress Notes (Signed)
 31 day ICM Remote transmission canceled due to [facility] clinic is on hold until further notice.  91 day remote monitoring will continue per protocol.

## 2024-07-11 ENCOUNTER — Ambulatory Visit

## 2024-07-12 NOTE — Progress Notes (Unsigned)
 "    Cardiology Office Note Date:  07/12/2024  Patient ID:  [name], [name] 1965-11-01, MRN [id number] PCP:  Vicci Barnie NOVAK, MD  Cardiologist:  Dr. Pietro Electrophysiologist: Dr. Kelsie >> Dr. [name]    Chief Complaint:   CP, fatigue, PVCs, new symptoms  History of Present Illness: [name] is a 59 y.o. male with history of  NICM/chronic systolic CHF s/p ICD, normal coronary arteries by cath 2015, GERD, HTN and DM2,  CKD (IV), VT/VF. LUE DVT  Admitted 05/03/22 with VT storm, 22 ICD shocks and 2 ATP, 10 aborted shocks on initial arrival. Felt to be likely in setting of hypokalemia, diarrhea, and infection. However had recurrent VT despite management and correction of both and back on amiodarone  gtt  >>  PO amiodarone  > developed bradycardia and intermittent RV pacing that triggered NSPMVT episodes, ultimately felt to be pause dependent and not pacing triggered. R/LHC looked good, volume stable, no obstructive CAD, LVEF stable 30-35% Underwent device upgrade to a dual chamber device 05/15/22 by Dr. [name]  Subsequently has had difficulty with significant sx of anxiety/panic, sees psychiatry MD next week  Following regularly with AHF and EP teams  Admitted 02/25/24 w/syncope PMVT > VF > got ATP without change in rhythm > followed by spontaneous termination, also noted NSVTs (polymorphic)  device programing: reduce NID in VT 2 zone to 20 and in the FV zone to 16 to try and avoid syncope  K+ and Mag both low following a GI illness Amiodarone  re-loaded Discharged 9/22  Admitted 10/9, briefly with CP felt to be MSK Admitted 1/5 again, briefly with CP, felt atypical, noncardiac, resumed on low dose Toprol  (previously stopped 2/2 fatigue)  Pt called 1/21 feeling funny, requested a review of device transmission, volume appeared stable. Presenting rhythm was SR with PVCs, no arrhythmias treated or untreated. He reported being out of his amiodarone  and mag Advised to check  labs and resume his medications.  K+ 3.2, mag was 1.7 BUN/Creat 41/3.27 LFTs ok  TODAY  He is feeling better/back to his baseline Thinks symptoms may have been the missed amio and magnesium , reports excellent medication compliance typically and never misses his diuretics, potassium. He has been intolerant of BB in the past and is not taking it   No rhyme reason to the CP he gets on occasion COPD he think is his primary driver for his DOE No dizzy spells, no near syncope or syncope No shocks No rest SOB  Never feels like he has much energy, says he did see the specialist about an inspire device was told he was too high risk He is going to look into a dental appliance   Device information Abbott single chamber ICD implanted 12/23/2013 >> upgraded to dual chamber device (A lead added) 05/15/22  + appropriate tx, VT storm  He is symptomatic (anxiety provoking) w/RV pacing/threshold testing  AAD hx Amiodarone  started Nov 2023 Intolerable fatigue with BB  Past Medical History:  Diagnosis Date   AICD (automatic cardioverter/defibrillator) present    Chronic bronchitis (HCC)    get it most q yr (12/23/2013)   Chronic systolic (congestive) heart failure (HCC)    Fracture of left humerus    a. 07/2013.   GERD (gastroesophageal reflux disease)    not at present time   Heart murmur    born w/it    History of renal calculi    Hypertension    Kidney stones    NICM (nonischemic cardiomyopathy) (HCC)  a. 07/2013 Echo: EF 20-25%, diff HK, Gr2 DD, mild MR, mod dil LA/RA. 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    Shockable heart rhythm detected by automated external defibrillator    Syncope    Type II diabetes mellitus (HCC) 2006    Past Surgical History:  Procedure Laterality Date   CARDIAC CATHETERIZATION  08/10/2013   CHOLECYSTECTOMY  ~ 2010   IMPLANTABLE CARDIOVERTER DEFIBRILLATOR IMPLANT   12/23/2013   STJ Fortify ICD implanted by Dr Kelsie for cardiomyopathy and syncope   IMPLANTABLE CARDIOVERTER DEFIBRILLATOR IMPLANT N/A 12/23/2013   Procedure: IMPLANTABLE CARDIOVERTER DEFIBRILLATOR IMPLANT;  Surgeon: Lynwood JONETTA Kelsie, MD;  Location: [facility] CATH LAB;  Service: Cardiovascular;  Laterality: N/A;   INGUINAL HERNIA REPAIR Left 03/01/2018   Procedure: OPEN REPAIR OF LEFT INGUINAL HERNIA WITH MESH;  Surgeon: Tanda Locus, MD;  Location: [facility] ORS;  Service: General;  Laterality: Left;   LEAD REVISION/REPAIR N/A 05/15/2022   Procedure: LEAD REVISION/REPAIR;  Surgeon: [name] Ole DASEN, MD;  Location: [facility] INVASIVE CV LAB;  Service: Cardiovascular;  Laterality: N/A;   LEFT HEART CATHETERIZATION WITH CORONARY ANGIOGRAM N/A 08/10/2013   Procedure: LEFT HEART CATHETERIZATION WITH CORONARY ANGIOGRAM;  Surgeon: Candyce GORMAN Reek, MD;  Location: [facility] CATH LAB;  Service: Cardiovascular;  Laterality: N/A;   ORIF HUMERUS FRACTURE Left 08/12/2013   Procedure: OPEN REDUCTION INTERNAL FIXATION (ORIF) HUMERAL SHAFT FRACTURE;  Surgeon: Jerona LULLA Sage, MD;  Location: [facility] OR;  Service: Orthopedics;  Laterality: Left;  Open Reduction Internal Fixation Left Humerus   RIGHT/LEFT HEART CATH AND CORONARY ANGIOGRAPHY N/A 05/12/2022   Procedure: RIGHT/LEFT HEART CATH AND CORONARY ANGIOGRAPHY;  Surgeon: Gardenia Led, DO;  Location: [facility] INVASIVE CV LAB;  Service: Cardiovascular;  Laterality: N/A;   URETEROSCOPY     laser for kidney stones    Current Outpatient Medications  Medication Sig Dispense Refill   allopurinol  (ZYLOPRIM ) 100 MG tablet Take 1 tablet (100 mg total) by mouth daily. 30 tablet 2   amiodarone  (PACERONE ) 200 MG tablet Take 1 tablet by mouth once daily 30 tablet 0   amiodarone  (PACERONE ) 200 MG tablet Take 1 tablet (200 mg total) by mouth daily. 30 tablet 5   atorvastatin  (LIPITOR) 10 MG tablet Take 1 tablet (10 mg total) by mouth daily. 90 tablet 2   benzonatate  (TESSALON ) 200 MG capsule Take 200  mg by mouth 2 (two) times daily as needed for cough. (Patient not taking: Reported on 06/30/2024)     cetirizine  (ZYRTEC ) 10 MG tablet Take 1 tablet by mouth once daily 90 tablet 0   cholecalciferol  (VITAMIN D3) 25 MCG (1000 UNIT) tablet Take 1,000 Units by mouth in the morning.     clonazePAM  (KLONOPIN ) 0.5 MG tablet Take 1 tablet (0.5 mg total) by mouth 2 (two) times daily. for anxiety 60 tablet 1   Clotrimazole  1 % OINT Apply 1 Application topically 2 (two) times daily as needed (irritation).     colchicine  0.6 MG tablet Take 0.5 tablets (0.3 mg total) by mouth every Monday, Wednesday, and Friday.     Continuous Glucose Sensor (FREESTYLE LIBRE 2 PLUS SENSOR) MISC Change sensor every 15 days. 2 each 11   EPINEPHrine  0.3 mg/0.3 mL IJ SOAJ injection Inject 0.3 mg into the muscle as needed. 1 each 1   fluticasone  (FLONASE ) 50 MCG/ACT nasal spray Place 2 sprays into both nostrils daily. 16 g 6   gabapentin  (NEURONTIN ) 300 MG capsule  Take 1 capsule (300 mg total) by mouth 3 (three) times daily. 90 capsule 0   insulin  aspart (NOVOLOG  FLEXPEN) 100 UNIT/ML FlexPen Inject 12 Units into the skin 3 (three) times daily with meals.     insulin  glargine (LANTUS  SOLOSTAR) 100 UNIT/ML Solostar Pen Inject 35 Units into the skin at bedtime.     Insulin  Pen Needle (PEN NEEDLES) 32G X 4 MM MISC Use to inject insulin  3 times daily. 100 each 6   latanoprost (XALATAN) 0.005 % ophthalmic solution Place 1 drop into both eyes at bedtime.     magnesium  oxide (MAG-OX) 400 MG tablet Take 1 tablet (400 mg total) by mouth daily. 90 tablet 3   methocarbamol  (ROBAXIN ) 500 MG tablet Take 500 mg by mouth daily.     metolazone  (ZAROXOLYN ) 5 MG tablet Take 1 tablet (5 mg total) by mouth once a week. 12 tablet 2   nystatin  (MYCOSTATIN /NYSTOP ) powder Apply 1 Application topically 3 (three) times daily. (Patient taking differently: Apply 1 Application topically 2 (two) times daily.) 60 g 2   pantoprazole  (PROTONIX ) 40 MG tablet Take 1  tablet (40 mg total) by mouth daily. 90 tablet 1   polyethylene glycol powder (GLYCOLAX /MIRALAX ) 17 GM/SCOOP powder Dissolve 1 capful (17g) in 4-8 ounces of liquid and take by mouth daily. 238 g 0   potassium chloride  SA (KLOR-CON  M) 20 MEQ tablet Take 2 tablets (40 mEq total) by mouth 2 (two) times daily. Take an extra 20 mEq when you take Metolazone . 120 tablet 11   prednisoLONE  acetate (PRED FORTE ) 1 % ophthalmic suspension Place 1 drop into the left eye at bedtime.     rivaroxaban  (XARELTO ) 20 MG TABS tablet Take 1 tablet (20 mg total) by mouth daily with supper. 30 tablet 2   sertraline  (ZOLOFT ) 50 MG tablet Take 1.5 tablets (75 mg total) by mouth daily. 45 tablet 2   SYMBICORT  160-4.5 MCG/ACT inhaler Inhale 2 puffs into the lungs in the morning and at bedtime.     tamsulosin  (FLOMAX ) 0.4 MG CAPS capsule Take 2 capsules (0.8 mg total) by mouth daily. 180 capsule 3   torsemide  (DEMADEX ) 20 MG tablet Take 3 tablets (60 mg total) by mouth daily. 180 tablet 3   No current facility-administered medications for this visit.    Allergies:   Bee venom and Trulicity  [dulaglutide ]   Social History:  The patient  reports that he quit smoking about 10 years ago. His smoking use included cigarettes. He started smoking about 38 years ago. He has a 28 pack-year smoking history. He has never used smokeless tobacco. He reports that he does not currently use alcohol. He reports current drug use. Drug: Marijuana.   Family History:  The patient's family history includes Arthritis in his father and mother; Diabetes in his father and mother; Hypertension in his brother, father, and mother; Mental illness in his brother; Parkinson's disease in his paternal grandfather.  ROS:  Please see the history of present illness.    All other systems are reviewed and otherwise negative.   PHYSICAL EXAM:  VS:  There were no vitals taken for this visit. BMI: There is no height or weight on file to calculate BMI. Well  nourished, well developed, in no acute distress HEENT: normocephalic, atraumatic Neck: no JVD, carotid bruits or masses Cardiac:  RRR; no significant murmurs, no rubs, or gallops Lungs: CTA b/l, no wheezing, rhonchi or rales Abd: soft, nontender MS: no deformity or atrophy Ext: no edema Skin: warm and dry,  no rash Neuro:  No gross deficits appreciated Psych: euthymic mood, full affect  ICD site: is well healed, no signs of infection   EKG:  done today and reviewed by myself ST 192bpm, artifact/lead movement, QTc read as No PVCs  Device interrogation done today and reviewed by myself:  Battery and auto lead measurements are good  RV pacing is very uncomfortable for him and very anxiety provoking Thresholds stable at his 03/28/24 in clinic check, not done today 06/30/24: treated VT with 1 spin ATP, successful  No PVCs noted today PVC burden <1% HR settled to the 90's   04/26/23: TTE (limited) 1. Left ventricular ejection fraction, by estimation, is 25 to 30%. The  left ventricle has severely decreased function. The left ventricle  demonstrates global hypokinesis.   Comparison(s): No significant change from prior study.   Conclusion(s)/Recommendation(s): Limited echo to exclude thrombus attached  to ICD lead or LV thrombus. No evidence of thrombus based on images.    08/22/2022: LVEF 35%, normal RV function   05/12/22: R/LHC HEMODYNAMICS: RA:                  1 mmHg (mean) RV:                  22/1-3 mmHg PA:                  23/6 mmHg (12 mean) PCWP:            4 mmHg (mean)                                      Estimated Fick CO/CI   7 L/min, 2.9 L/min/m2                                                 TPG                 8  mmHg                                              PVR                 ~1 Wood Units  PAPi                >5       IMPRESSION: Low pre and post capillary filling pressures.  Normal cardiac output/cardiac index.  Normal PVR & PA mean No  obstructive CAD, left dominant w/ large Lcx supplying multiple large marginals.    05/06/22: TTE 1. Left ventricular ejection fraction, by estimation, is 30 to 35%. The  left ventricle has moderately decreased function. The left ventricle  demonstrates global hypokinesis. The left ventricular internal cavity size  was moderately dilated. There is mild   eccentric left ventricular hypertrophy. Left ventricular diastolic  parameters are consistent with Grade I diastolic dysfunction (impaired  relaxation).   2. Right ventricular systolic function is normal. The right ventricular  size is normal. Tricuspid regurgitation signal is inadequate for assessing  PA pressure.   3. Left atrial size was moderately dilated.   4. The mitral valve  is normal in structure. No evidence of mitral valve  regurgitation.   5. The aortic valve is tricuspid. Aortic valve regurgitation is not  visualized. No aortic stenosis is present.   6. Aortic dilatation noted. There is borderline dilatation of the aortic  root, measuring 38 mm.   7. The inferior vena cava is normal in size with greater than 50%  respiratory variability, suggesting right atrial pressure of 3 mmHg.   Comparison(s): No significant change from prior study. Prior images  reviewed side by side.    Recent Labs: 03/28/2024: TSH 1.220 04/21/2024: B Natriuretic Peptide 20.6 06/14/2024: Hemoglobin 12.5; Platelets 240; Pro Brain Natriuretic Peptide 370.0 06/30/2024: ALT 30; BUN 41; Creatinine, Ser 3.27; Magnesium  1.7; Potassium 3.2; Sodium 139  No results found for requested labs within last 365 days.   Estimated Creatinine Clearance: 33.5 mL/min (A) (by C-G formula based on SCr of 3.27 mg/dL (H)).   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 Almetta in the hospital, like him, > plan to transition to him  ICD intact function no programming changes made  NICM Chronic CHF (systolic) CorVue is good  No symptoms or exam findings of volume OL hx of orthostatic symptoms Follows closely with the AHF team, his nephrologist (Dr. Jerrye)   5. Post VT storm anxiety 6. PTSD Continues with counseling is helpful    Disposition: remotes as usual, in clinic in 1-51mo, sooner if needed      Current medicines are reviewed at length with the patient today.  The patient did not have any concerns regarding medicines.  Bonney Charlies Arthur, PA-C 07/12/2024 7:46 AM     [facility] [address] [place] [address] [phone] (office)  [phone] (fax)  "

## 2024-07-13 ENCOUNTER — Other Ambulatory Visit: Payer: Self-pay | Admitting: *Deleted

## 2024-07-13 ENCOUNTER — Encounter: Payer: Self-pay | Admitting: Physician Assistant

## 2024-07-13 ENCOUNTER — Ambulatory Visit: Admitting: Physician Assistant

## 2024-07-13 VITALS — BP 106/62 | HR 102 | Ht 73.0 in | Wt 274.0 lb

## 2024-07-13 DIAGNOSIS — I5022 Chronic systolic (congestive) heart failure: Secondary | ICD-10-CM

## 2024-07-13 DIAGNOSIS — Z95810 Presence of automatic (implantable) cardiac defibrillator: Secondary | ICD-10-CM

## 2024-07-13 DIAGNOSIS — I472 Ventricular tachycardia, unspecified: Secondary | ICD-10-CM

## 2024-07-13 DIAGNOSIS — I428 Other cardiomyopathies: Secondary | ICD-10-CM | POA: Diagnosis not present

## 2024-07-13 DIAGNOSIS — I493 Ventricular premature depolarization: Secondary | ICD-10-CM

## 2024-07-13 LAB — BASIC METABOLIC PANEL WITH GFR
BUN/Creatinine Ratio: 11 (ref 9–20)
BUN: 35 mg/dL — ABNORMAL HIGH (ref 6–24)
CO2: 31 mmol/L — ABNORMAL HIGH (ref 20–29)
Calcium: 10.5 mg/dL — ABNORMAL HIGH (ref 8.7–10.2)
Chloride: 96 mmol/L (ref 96–106)
Creatinine, Ser: 3.05 mg/dL — ABNORMAL HIGH (ref 0.76–1.27)
Glucose: 100 mg/dL — ABNORMAL HIGH (ref 70–99)
Potassium: 3.4 mmol/L — ABNORMAL LOW (ref 3.5–5.2)
Sodium: 143 mmol/L (ref 134–144)
eGFR: 23 mL/min/{1.73_m2} — ABNORMAL LOW

## 2024-07-13 LAB — CUP PACEART INCLINIC DEVICE CHECK
Battery Remaining Longevity: 84 mo
Brady Statistic RA Percent Paced: 3.2 %
Brady Statistic RV Percent Paced: 2.4 %
Date Time Interrogation Session: 20260204164608
HighPow Impedance: 78.75 Ohm
Implantable Lead Connection Status: 753985
Implantable Lead Connection Status: 753985
Implantable Lead Implant Date: 20150717
Implantable Lead Implant Date: 20231207
Implantable Lead Location: 753859
Implantable Lead Location: 753860
Implantable Pulse Generator Implant Date: 20231207
Lead Channel Impedance Value: 337.5 Ohm
Lead Channel Impedance Value: 362.5 Ohm
Lead Channel Pacing Threshold Amplitude: 0.5 V
Lead Channel Pacing Threshold Pulse Width: 0.5 ms
Lead Channel Sensing Intrinsic Amplitude: 1.1 mV
Lead Channel Sensing Intrinsic Amplitude: 12 mV
Lead Channel Setting Pacing Amplitude: 1.5 V
Lead Channel Setting Pacing Amplitude: 2 V
Lead Channel Setting Pacing Pulse Width: 0.5 ms
Lead Channel Setting Sensing Sensitivity: 0.5 mV
Pulse Gen Serial Number: 810051574
Zone Setting Status: 755011

## 2024-07-13 LAB — MAGNESIUM: Magnesium: 1.8 mg/dL (ref 1.6–2.3)

## 2024-07-13 MED ORDER — AMIODARONE HCL 200 MG PO TABS: 200.0000 mg | ORAL_TABLET | Freq: Every day | ORAL | 3 refills | Status: AC

## 2024-07-13 NOTE — Patient Instructions (Addendum)
 Medication Instructions:   FOR ONE WEEK ONLY :  TAKE AMIODARONE   200 MG  TWICE  A  DAY   THEN RESUME BACK TO ONCE  A DAY     *If you need a refill on your cardiac medications before your next appointment, please call your pharmacy*   Lab Work:  PLEASE GO DOWN STAIRS  [facility]  FIRST FLOOR   ( GET OFF ELEVATORS WALK TOWARDS WAITING AREA LAB LOCATED BY PHARMACY):  BMET  AND MAG TODAY       If you have labs (blood work) drawn today and your tests are completely normal, you will receive your results only by: MyChart Message (if you have MyChart) OR A paper copy in the mail If you have any lab test that is abnormal or we need to change your treatment, we will call you to review the results.  Testing/Procedures: NONE ORDERED  TODAY    Follow-Up: At [facility], you and your health needs are our priority.  As part of our continuing mission to provide you with exceptional heart care, our providers are all part of one team.  This team includes your primary Cardiologist (physician) and Advanced Practice Providers or APPs (Physician Assistants and Nurse Practitioners) who all work together to provide you with the care you need, when you need it.  Your next appointment:  1 -2 month(s)   Provider:   Donnice Primus, MD or Charlies Arthur, PA-C  ( CONTACT  CASSIE HALL/ ANGELINE HAMMER FOR EP SCHEDULING ISSUES )\   We recommend signing up for the patient portal called MyChart.  Sign up information is provided on this After Visit Summary.  MyChart is used to connect with patients for Virtual Visits (Telemedicine).  Patients are able to view lab/test results, encounter notes, upcoming appointments, etc.  Non-urgent messages can be sent to your provider as well.   To learn more about what you can do with MyChart, go to [email or web address].   Other Instructions

## 2024-07-14 ENCOUNTER — Ambulatory Visit: Payer: Self-pay | Admitting: Physician Assistant

## 2024-07-14 DIAGNOSIS — Z79899 Other long term (current) drug therapy: Secondary | ICD-10-CM

## 2024-07-14 MED ORDER — POTASSIUM CHLORIDE CRYS ER 20 MEQ PO TBCR: 60.0000 meq | EXTENDED_RELEASE_TABLET | Freq: Two times a day (BID) | ORAL | Status: AC

## 2024-07-14 MED ORDER — MAGNESIUM OXIDE 400 MG PO TABS: 400.0000 mg | ORAL_TABLET | Freq: Two times a day (BID) | ORAL | Status: AC

## 2024-07-14 NOTE — Patient Instructions (Signed)
 [name] - I am sorry  you  where unable to  complete outreach today for our scheduled appointment.  I have rescheduled your appointment for 08/04/24 @ 2:30 pm.  I work with Vicci Barnie NOVAK, MD and am calling to support your healthcare needs. Please contact me at [phone] at your earliest convenience. I look forward to speaking with you soon.   Thank you,  Tesslyn Baumert, RN, BSN, ACM RN Care Manager [employer] [phone]

## 2024-07-25 ENCOUNTER — Telehealth (HOSPITAL_COMMUNITY): Admitting: Psychiatry

## 2024-07-25 ENCOUNTER — Encounter: Admitting: Surgical

## 2024-08-02 ENCOUNTER — Ambulatory Visit: Admitting: Internal Medicine

## 2024-08-04 ENCOUNTER — Telehealth: Admitting: *Deleted

## 2024-08-15 ENCOUNTER — Encounter: Admitting: Physical Medicine and Rehabilitation

## 2024-09-20 ENCOUNTER — Ambulatory Visit: Admitting: Physician Assistant

## 2024-09-26 ENCOUNTER — Ambulatory Visit: Admitting: Podiatry
